# Patient Record
Sex: Female | Born: 1945
Health system: Southern US, Community
[De-identification: ages and names within clinical notes are randomized; demographics above are authoritative.]

## PROBLEM LIST (undated history)

## (undated) DIAGNOSIS — W19XXXA Unspecified fall, initial encounter: Secondary | ICD-10-CM

## (undated) DIAGNOSIS — T426X1A Poisoning by other antiepileptic and sedative-hypnotic drugs, accidental (unintentional), initial encounter: Secondary | ICD-10-CM

## (undated) DIAGNOSIS — G2581 Restless legs syndrome: Secondary | ICD-10-CM

## (undated) DIAGNOSIS — K0889 Other specified disorders of teeth and supporting structures: Secondary | ICD-10-CM

## (undated) DIAGNOSIS — E039 Hypothyroidism, unspecified: Secondary | ICD-10-CM

## (undated) DIAGNOSIS — F319 Bipolar disorder, unspecified: Secondary | ICD-10-CM

## (undated) DIAGNOSIS — M5430 Sciatica, unspecified side: Secondary | ICD-10-CM

## (undated) DIAGNOSIS — R519 Headache, unspecified: Secondary | ICD-10-CM

## (undated) DIAGNOSIS — F32A Depression, unspecified: Secondary | ICD-10-CM

## (undated) DIAGNOSIS — M549 Dorsalgia, unspecified: Secondary | ICD-10-CM

## (undated) DIAGNOSIS — E119 Type 2 diabetes mellitus without complications: Secondary | ICD-10-CM

## (undated) DIAGNOSIS — R32 Unspecified urinary incontinence: Secondary | ICD-10-CM

## (undated) DIAGNOSIS — M47812 Spondylosis without myelopathy or radiculopathy, cervical region: Secondary | ICD-10-CM

## (undated) DIAGNOSIS — F419 Anxiety disorder, unspecified: Secondary | ICD-10-CM

## (undated) DIAGNOSIS — G43909 Migraine, unspecified, not intractable, without status migrainosus: Secondary | ICD-10-CM

## (undated) DIAGNOSIS — G8929 Other chronic pain: Secondary | ICD-10-CM

## (undated) DIAGNOSIS — K3184 Gastroparesis: Secondary | ICD-10-CM

## (undated) DIAGNOSIS — K559 Vascular disorder of intestine, unspecified: Secondary | ICD-10-CM

## (undated) DIAGNOSIS — R600 Localized edema: Secondary | ICD-10-CM

## (undated) DIAGNOSIS — M1990 Unspecified osteoarthritis, unspecified site: Secondary | ICD-10-CM

## (undated) DIAGNOSIS — G4730 Sleep apnea, unspecified: Secondary | ICD-10-CM

## (undated) DIAGNOSIS — G9332 Myalgic encephalomyelitis/chronic fatigue syndrome: Secondary | ICD-10-CM

## (undated) DIAGNOSIS — R3915 Urgency of urination: Secondary | ICD-10-CM

## (undated) DIAGNOSIS — K760 Fatty (change of) liver, not elsewhere classified: Secondary | ICD-10-CM

## (undated) DIAGNOSIS — L930 Discoid lupus erythematosus: Secondary | ICD-10-CM

## (undated) DIAGNOSIS — F329 Major depressive disorder, single episode, unspecified: Secondary | ICD-10-CM

## (undated) DIAGNOSIS — H9193 Unspecified hearing loss, bilateral: Secondary | ICD-10-CM

## (undated) DIAGNOSIS — R410 Disorientation, unspecified: Secondary | ICD-10-CM

## (undated) DIAGNOSIS — J189 Pneumonia, unspecified organism: Secondary | ICD-10-CM

## (undated) DIAGNOSIS — K589 Irritable bowel syndrome without diarrhea: Secondary | ICD-10-CM

## (undated) DIAGNOSIS — M797 Fibromyalgia: Secondary | ICD-10-CM

## (undated) DIAGNOSIS — F1010 Alcohol abuse, uncomplicated: Secondary | ICD-10-CM

## (undated) DIAGNOSIS — Z87442 Personal history of urinary calculi: Secondary | ICD-10-CM

## (undated) DIAGNOSIS — M2540 Effusion, unspecified joint: Secondary | ICD-10-CM

## (undated) DIAGNOSIS — E785 Hyperlipidemia, unspecified: Secondary | ICD-10-CM

## (undated) DIAGNOSIS — M19049 Primary osteoarthritis, unspecified hand: Secondary | ICD-10-CM

## (undated) DIAGNOSIS — K5900 Constipation, unspecified: Secondary | ICD-10-CM

## (undated) DIAGNOSIS — M2550 Pain in unspecified joint: Secondary | ICD-10-CM

## (undated) DIAGNOSIS — I10 Essential (primary) hypertension: Secondary | ICD-10-CM

## (undated) DIAGNOSIS — R200 Anesthesia of skin: Secondary | ICD-10-CM

## (undated) DIAGNOSIS — R350 Frequency of micturition: Secondary | ICD-10-CM

## (undated) DIAGNOSIS — R1310 Dysphagia, unspecified: Secondary | ICD-10-CM

## (undated) DIAGNOSIS — K5790 Diverticulosis of intestine, part unspecified, without perforation or abscess without bleeding: Secondary | ICD-10-CM

## (undated) DIAGNOSIS — N393 Stress incontinence (female) (male): Secondary | ICD-10-CM

## (undated) DIAGNOSIS — R51 Headache: Secondary | ICD-10-CM

## (undated) HISTORY — DX: Hyperlipidemia, unspecified: E78.5

## (undated) HISTORY — DX: Localized edema: R60.0

## (undated) HISTORY — DX: Unspecified hearing loss, bilateral: H91.93

## (undated) HISTORY — PX: TUBAL LIGATION: SHX77

## (undated) HISTORY — DX: Vascular disorder of intestine, unspecified: K55.9

## (undated) HISTORY — DX: Myalgic encephalomyelitis/chronic fatigue syndrome: G93.32

## (undated) HISTORY — PX: COLONOSCOPY: SHX174

## (undated) HISTORY — DX: Bipolar disorder, unspecified: F31.9

## (undated) HISTORY — DX: Unspecified osteoarthritis, unspecified site: M19.90

## (undated) HISTORY — DX: Anxiety disorder, unspecified: F41.9

## (undated) HISTORY — DX: Spondylosis without myelopathy or radiculopathy, cervical region: M47.812

## (undated) HISTORY — DX: Other specified disorders of teeth and supporting structures: K08.89

## (undated) HISTORY — DX: Stress incontinence (female) (male): N39.3

## (undated) HISTORY — DX: Depression, unspecified: F32.A

## (undated) HISTORY — DX: Sciatica, unspecified side: M54.30

## (undated) HISTORY — PX: APPENDECTOMY: SHX54

## (undated) HISTORY — DX: Sleep apnea, unspecified: G47.30

## (undated) HISTORY — DX: Dysphagia, unspecified: R13.10

## (undated) HISTORY — DX: Type 2 diabetes mellitus without complications: E11.9

## (undated) HISTORY — DX: Alcohol abuse, uncomplicated: F10.10

## (undated) HISTORY — PX: SHOULDER ARTHROSCOPY: SHX128

## (undated) HISTORY — DX: Major depressive disorder, single episode, unspecified: F32.9

## (undated) HISTORY — DX: Fatty (change of) liver, not elsewhere classified: K76.0

## (undated) HISTORY — PX: ESOPHAGOGASTRODUODENOSCOPY: SHX1529

## (undated) HISTORY — DX: Primary osteoarthritis, unspecified hand: M19.049

## (undated) HISTORY — PX: TONSILLECTOMY: SUR1361

## (undated) HISTORY — PX: JOINT REPLACEMENT: SHX530

## (undated) HISTORY — PX: ABDOMINAL HYSTERECTOMY: SHX81

## (undated) HISTORY — PX: DILATION AND CURETTAGE OF UTERUS: SHX78

---
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
## 2005-08-23 ENCOUNTER — Ambulatory Visit: Payer: Self-pay | Admitting: *Deleted

## 2005-08-23 ENCOUNTER — Inpatient Hospital Stay (HOSPITAL_COMMUNITY): Admission: EM | Admit: 2005-08-23 | Discharge: 2005-08-26 | Payer: Self-pay | Admitting: Psychiatry

## 2005-08-23 ENCOUNTER — Emergency Department (HOSPITAL_COMMUNITY): Admission: EM | Admit: 2005-08-23 | Discharge: 2005-08-23 | Payer: Self-pay | Admitting: Emergency Medicine

## 2006-08-04 ENCOUNTER — Inpatient Hospital Stay (HOSPITAL_COMMUNITY): Admission: EM | Admit: 2006-08-04 | Discharge: 2006-08-10 | Payer: Self-pay | Admitting: Psychiatry

## 2006-08-04 ENCOUNTER — Emergency Department (HOSPITAL_COMMUNITY): Admission: EM | Admit: 2006-08-04 | Discharge: 2006-08-04 | Payer: Self-pay | Admitting: Emergency Medicine

## 2006-08-04 ENCOUNTER — Ambulatory Visit: Payer: Self-pay | Admitting: Psychiatry

## 2006-10-11 ENCOUNTER — Emergency Department (HOSPITAL_COMMUNITY): Admission: EM | Admit: 2006-10-11 | Discharge: 2006-10-11 | Payer: Self-pay | Admitting: Emergency Medicine

## 2006-10-13 ENCOUNTER — Other Ambulatory Visit (HOSPITAL_COMMUNITY): Admission: RE | Admit: 2006-10-13 | Discharge: 2007-01-11 | Payer: Self-pay | Admitting: Psychiatry

## 2006-10-14 ENCOUNTER — Ambulatory Visit: Payer: Self-pay | Admitting: Psychiatry

## 2006-12-15 ENCOUNTER — Other Ambulatory Visit (HOSPITAL_COMMUNITY): Admission: RE | Admit: 2006-12-15 | Discharge: 2006-12-20 | Payer: Self-pay | Admitting: Psychiatry

## 2007-01-15 ENCOUNTER — Ambulatory Visit: Payer: Self-pay | Admitting: *Deleted

## 2007-01-15 ENCOUNTER — Inpatient Hospital Stay (HOSPITAL_COMMUNITY): Admission: EM | Admit: 2007-01-15 | Discharge: 2007-01-20 | Payer: Self-pay | Admitting: *Deleted

## 2007-01-15 ENCOUNTER — Emergency Department (HOSPITAL_COMMUNITY): Admission: EM | Admit: 2007-01-15 | Discharge: 2007-01-15 | Payer: Self-pay | Admitting: Emergency Medicine

## 2007-03-04 ENCOUNTER — Emergency Department (HOSPITAL_COMMUNITY): Admission: EM | Admit: 2007-03-04 | Discharge: 2007-03-04 | Payer: Self-pay | Admitting: Emergency Medicine

## 2007-04-28 ENCOUNTER — Encounter: Admission: RE | Admit: 2007-04-28 | Discharge: 2007-04-28 | Payer: Self-pay | Admitting: General Practice

## 2010-03-09 DIAGNOSIS — K559 Vascular disorder of intestine, unspecified: Secondary | ICD-10-CM

## 2010-03-09 HISTORY — DX: Vascular disorder of intestine, unspecified: K55.9

## 2010-04-28 ENCOUNTER — Encounter: Payer: Self-pay | Admitting: Internal Medicine

## 2010-05-01 ENCOUNTER — Encounter: Payer: Self-pay | Admitting: Internal Medicine

## 2010-05-05 ENCOUNTER — Other Ambulatory Visit: Payer: Self-pay | Admitting: Internal Medicine

## 2010-05-05 ENCOUNTER — Other Ambulatory Visit: Payer: BC Managed Care – PPO

## 2010-05-05 ENCOUNTER — Ambulatory Visit (INDEPENDENT_AMBULATORY_CARE_PROVIDER_SITE_OTHER): Payer: BC Managed Care – PPO | Admitting: Internal Medicine

## 2010-05-05 ENCOUNTER — Encounter: Payer: Self-pay | Admitting: Internal Medicine

## 2010-05-05 DIAGNOSIS — J309 Allergic rhinitis, unspecified: Secondary | ICD-10-CM | POA: Insufficient documentation

## 2010-05-05 DIAGNOSIS — H6690 Otitis media, unspecified, unspecified ear: Secondary | ICD-10-CM | POA: Insufficient documentation

## 2010-05-05 DIAGNOSIS — F341 Dysthymic disorder: Secondary | ICD-10-CM

## 2010-05-05 DIAGNOSIS — M19049 Primary osteoarthritis, unspecified hand: Secondary | ICD-10-CM | POA: Insufficient documentation

## 2010-05-05 DIAGNOSIS — R930 Abnormal findings on diagnostic imaging of skull and head, not elsewhere classified: Secondary | ICD-10-CM

## 2010-05-05 DIAGNOSIS — H9190 Unspecified hearing loss, unspecified ear: Secondary | ICD-10-CM | POA: Insufficient documentation

## 2010-05-05 DIAGNOSIS — E1169 Type 2 diabetes mellitus with other specified complication: Secondary | ICD-10-CM | POA: Insufficient documentation

## 2010-05-05 DIAGNOSIS — E785 Hyperlipidemia, unspecified: Secondary | ICD-10-CM | POA: Insufficient documentation

## 2010-05-05 DIAGNOSIS — N393 Stress incontinence (female) (male): Secondary | ICD-10-CM | POA: Insufficient documentation

## 2010-05-05 DIAGNOSIS — J069 Acute upper respiratory infection, unspecified: Secondary | ICD-10-CM

## 2010-05-05 DIAGNOSIS — F1011 Alcohol abuse, in remission: Secondary | ICD-10-CM

## 2010-05-05 DIAGNOSIS — R9431 Abnormal electrocardiogram [ECG] [EKG]: Secondary | ICD-10-CM

## 2010-05-05 DIAGNOSIS — M479 Spondylosis, unspecified: Secondary | ICD-10-CM | POA: Insufficient documentation

## 2010-05-05 DIAGNOSIS — F32A Depression, unspecified: Secondary | ICD-10-CM | POA: Insufficient documentation

## 2010-05-05 LAB — CBC WITH DIFFERENTIAL/PLATELET
Basophils Absolute: 0 10*3/uL (ref 0.0–0.1)
Basophils Relative: 0.1 % (ref 0.0–3.0)
Eosinophils Absolute: 0.4 10*3/uL (ref 0.0–0.7)
Eosinophils Relative: 3.4 % (ref 0.0–5.0)
HCT: 40.5 % (ref 36.0–46.0)
Hemoglobin: 13.9 g/dL (ref 12.0–15.0)
Lymphocytes Relative: 20.8 % (ref 12.0–46.0)
Lymphs Abs: 2.5 10*3/uL (ref 0.7–4.0)
MCHC: 34.2 g/dL (ref 30.0–36.0)
MCV: 93.4 fl (ref 78.0–100.0)
Monocytes Absolute: 0.6 10*3/uL (ref 0.1–1.0)
Monocytes Relative: 4.5 % (ref 3.0–12.0)
Neutro Abs: 8.7 10*3/uL — ABNORMAL HIGH (ref 1.4–7.7)
Neutrophils Relative %: 71.2 % (ref 43.0–77.0)
Platelets: 263 10*3/uL (ref 150.0–400.0)
RBC: 4.34 Mil/uL (ref 3.87–5.11)
RDW: 12.7 % (ref 11.5–14.6)
WBC: 12.2 10*3/uL — ABNORMAL HIGH (ref 4.5–10.5)

## 2010-05-05 LAB — BASIC METABOLIC PANEL
BUN: 14 mg/dL (ref 6–23)
CO2: 29 mEq/L (ref 19–32)
Calcium: 10 mg/dL (ref 8.4–10.5)
Chloride: 105 mEq/L (ref 96–112)
Creatinine, Ser: 0.9 mg/dL (ref 0.4–1.2)
GFR: 64.07 mL/min (ref >60.00)
Glucose, Bld: 80 mg/dL (ref 70–99)
Potassium: 4.4 mEq/L (ref 3.5–5.1)
Sodium: 141 mEq/L (ref 135–145)

## 2010-05-05 LAB — LIPID PANEL
Cholesterol: 229 mg/dL — ABNORMAL HIGH (ref 0–200)
HDL: 78.3 mg/dL (ref >39.00)
Total CHOL/HDL Ratio: 3
Triglycerides: 254 mg/dL — ABNORMAL HIGH (ref 0.0–149.0)
VLDL: 50.8 mg/dL — ABNORMAL HIGH (ref 0.0–40.0)

## 2010-05-05 LAB — HEPATIC FUNCTION PANEL
ALT: 11 U/L (ref 0–35)
AST: 15 U/L (ref 0–37)
Albumin: 3.5 g/dL (ref 3.5–5.2)
Alkaline Phosphatase: 62 U/L (ref 39–117)
Bilirubin, Direct: 0.1 mg/dL (ref 0.0–0.3)
Total Bilirubin: 0.6 mg/dL (ref 0.3–1.2)
Total Protein: 6.4 g/dL (ref 6.0–8.3)

## 2010-05-05 LAB — LDL CHOLESTEROL, DIRECT: Direct LDL: 122.3 mg/dL

## 2010-05-05 LAB — TSH: TSH: 1 u[IU]/mL (ref 0.35–5.50)

## 2010-05-06 ENCOUNTER — Telehealth: Payer: Self-pay | Admitting: Internal Medicine

## 2010-05-12 ENCOUNTER — Ambulatory Visit: Payer: BC Managed Care – PPO | Admitting: Internal Medicine

## 2010-05-13 ENCOUNTER — Encounter: Payer: Self-pay | Admitting: Internal Medicine

## 2010-05-13 ENCOUNTER — Ambulatory Visit (INDEPENDENT_AMBULATORY_CARE_PROVIDER_SITE_OTHER): Payer: BC Managed Care – PPO | Admitting: Internal Medicine

## 2010-05-13 ENCOUNTER — Ambulatory Visit (HOSPITAL_COMMUNITY)
Admission: RE | Admit: 2010-05-13 | Discharge: 2010-05-13 | Disposition: A | Discharge status: Home / Self Care | Payer: BC Managed Care – PPO | Source: Ambulatory Visit | Attending: Internal Medicine | Admitting: Internal Medicine

## 2010-05-13 DIAGNOSIS — D352 Benign neoplasm of pituitary gland: Secondary | ICD-10-CM | POA: Insufficient documentation

## 2010-05-13 DIAGNOSIS — R9431 Abnormal electrocardiogram [ECG] [EKG]: Secondary | ICD-10-CM

## 2010-05-13 DIAGNOSIS — R930 Abnormal findings on diagnostic imaging of skull and head, not elsewhere classified: Secondary | ICD-10-CM

## 2010-05-13 MED ORDER — GADOBENATE DIMEGLUMINE 529 MG/ML IV SOLN
10.0000 mL | Freq: Once | INTRAVENOUS | Status: AC | PRN
  Administered 2010-05-13: 10 mL via INTRAVENOUS

## 2010-05-15 ENCOUNTER — Telehealth: Payer: Self-pay | Admitting: Internal Medicine

## 2010-05-15 NOTE — Letter (Signed)
Summary: Sports Medicine & [facility]  Sports Medicine & [facility]   Imported By: Sherian Rein 05/07/2010 10:10:36  _____________________________________________________________________  External Attachment:    Type:   Image     Comment:   External Document

## 2010-05-15 NOTE — Progress Notes (Signed)
Summary: ?  Phone Note Other Incoming   Caller: [phone] Summary of Call: Pt is wanting to know why she is sch for a stress test. Please Advise Initial call taken by: Ami Bullins CMA,  May 06, 2010 8:45 AM  Follow-up for Phone Call        abnormal EKG with suggestion of old injury/infarct. It is documented in my office note.  Follow-up by: Jacques Navy MD,  May 06, 2010 9:29 AM  Additional Follow-up for Phone Call Additional follow up Details #1::        left detailed vm on pt's cell  Additional Follow-up by: Lamar Sprinkles, CMA,  May 06, 2010 5:17 PM
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
## 2010-05-16 ENCOUNTER — Telehealth (INDEPENDENT_AMBULATORY_CARE_PROVIDER_SITE_OTHER): Payer: Self-pay | Admitting: *Deleted

## 2010-05-19 ENCOUNTER — Encounter: Payer: Self-pay | Admitting: Internal Medicine

## 2010-05-19 ENCOUNTER — Ambulatory Visit (HOSPITAL_COMMUNITY): Payer: BC Managed Care – PPO | Attending: Internal Medicine

## 2010-05-19 DIAGNOSIS — R0609 Other forms of dyspnea: Secondary | ICD-10-CM | POA: Insufficient documentation

## 2010-05-19 DIAGNOSIS — E785 Hyperlipidemia, unspecified: Secondary | ICD-10-CM | POA: Insufficient documentation

## 2010-05-19 DIAGNOSIS — R42 Dizziness and giddiness: Secondary | ICD-10-CM | POA: Insufficient documentation

## 2010-05-19 DIAGNOSIS — R0989 Other specified symptoms and signs involving the circulatory and respiratory systems: Secondary | ICD-10-CM | POA: Insufficient documentation

## 2010-05-19 DIAGNOSIS — Z8249 Family history of ischemic heart disease and other diseases of the circulatory system: Secondary | ICD-10-CM | POA: Insufficient documentation

## 2010-05-19 DIAGNOSIS — R5383 Other fatigue: Secondary | ICD-10-CM | POA: Insufficient documentation

## 2010-05-19 DIAGNOSIS — R5381 Other malaise: Secondary | ICD-10-CM | POA: Insufficient documentation

## 2010-05-19 DIAGNOSIS — R079 Chest pain, unspecified: Secondary | ICD-10-CM | POA: Insufficient documentation

## 2010-05-19 DIAGNOSIS — R072 Precordial pain: Secondary | ICD-10-CM

## 2010-05-20 NOTE — Assessment & Plan Note (Signed)
Summary: PER SARAH 1 WK 30 MIN FU--STC   Vital Signs:  Patient profile:   65 year old female Height:      62 inches Weight:      154 pounds BMI:     28.27 O2 Sat:      97 % on Room air Temp:     98.0 degrees F oral Pulse rate:   75 / minute BP sitting:   120 / 80  (left arm) Cuff size:   regular  Vitals Entered By: Bill Salinas CMA (May 13, 2010 1:03 PM)  O2 Flow:  Room air CC: ov to discuss lab work/ ab   Primary Care Provider:  Illene Regulus  CC:  ov to discuss lab work/ ab.  History of Present Illness: patient presents to discuss labs but she had already received a copy. She is for MRI today to evaluate question enlarged pituitary. She is schedule for stress echo on March 12 due to deep q wave V1 and V2 on EKG to r/o old infarct with any wall motion abnormality.  she reports a persistent cough but is not taking any anti-tussive agent.   Current Medications (verified): 1)  Lithium Carbonate 300 Mg Caps (Lithium Carbonate) .Marland Kitchen.. 1 Capsule Two Times A Day 2)  Cenestin 0.625 Mg Tabs (Estrogens Conj Synthetic A) .... 2 Tablets Once Daily 3)  Lexapro 10 Mg Tabs (Escitalopram Oxalate) .Marland Kitchen.. 1 Tablet At Night 4)  Levothroid 75 Mcg Tabs (Levothyroxine Sodium) .Marland Kitchen.. 1 Tablet in The Morning 5)  Abilify 2 Mg Tabs (Aripiprazole) .Marland Kitchen.. 1 Qam 6)  Seroquel 100 Mg Tabs (Quetiapine Fumarate) .... 3 Tablets At Bedtime 7)  Valtrex 500 Mg Tabs (Valacyclovir Hcl) .Marland Kitchen.. 1 Tablet Two Times A Day 8)  Valium 5 Mg Tabs (Diazepam) .... 3-4 Tablets As Needed Daily 9)  Fluticasone Propionate 50 Mcg/act Susp (Fluticasone Propionate) .Marland Kitchen.. 1 Spray in Eac Nostril Two Times A Day For Short Term Management of Flare of Congestion  Allergies (verified): 1)  ! Codeine 2)  ! * Decongestants 3)  ! Novocain 4)  ! * Mycins   Complete Medication List: 1)  Lithium Carbonate 300 Mg Caps (Lithium carbonate) .Marland Kitchen.. 1 capsule two times a day 2)  Cenestin 0.625 Mg Tabs (Estrogens conj synthetic a) .... 2 tablets once  daily 3)  Lexapro 10 Mg Tabs (Escitalopram oxalate) .Marland Kitchen.. 1 tablet at night 4)  Levothroid 75 Mcg Tabs (Levothyroxine sodium) .Marland Kitchen.. 1 tablet in the morning 5)  Abilify 2 Mg Tabs (Aripiprazole) .Marland Kitchen.. 1 qam 6)  Seroquel 100 Mg Tabs (Quetiapine fumarate) .... 3 tablets at bedtime 7)  Valtrex 500 Mg Tabs (Valacyclovir hcl) .Marland Kitchen.. 1 tablet two times a day 8)  Valium 5 Mg Tabs (Diazepam) .... 3-4 tablets as needed daily 9)  Fluticasone Propionate 50 Mcg/act Susp (Fluticasone propionate) .Marland Kitchen.. 1 spray in eac nostril two times a day for short term management of flare of congestion  Other Orders: No Charge Patient Arrived (NCPA0) (NCPA0)   Orders Added: 1)  No Charge Patient Arrived (NCPA0) [NCPA0]

## 2010-05-20 NOTE — Progress Notes (Signed)
Summary: CALL   Phone Note Call from Patient Call back at Home Phone [phone]   Summary of Call: Patient is requesting a call back from MD regarding results.  Initial call taken by: Lamar Sprinkles, CMA,  May 15, 2010 9:09 AM  Follow-up for Phone Call        called patient: 1. small microadenoma of the pituitary that is asymptomatic. Plan is follow up study in one year 2. MRI reveals sinusitis with air fluid levels.She has sinus congestion and sweats but no fever. Plan - augmentin 875 two times a day x 14. If not resolved - ENT referral.  Follow-up by: Jacques Navy MD,  May 15, 2010 6:34 PM    New/Updated Medications: AMOXICILLIN-POT CLAVULANATE 875-125 MG TABS (AMOXICILLIN-POT CLAVULANATE) 1 by mouth two times a day x 14 for sinusitis Prescriptions: AMOXICILLIN-POT CLAVULANATE 875-125 MG TABS (AMOXICILLIN-POT CLAVULANATE) 1 by mouth two times a day x 14 for sinusitis  #28 x 0   Entered and Authorized by:   Jacques Navy MD   Signed by:   Jacques Navy MD on 05/15/2010   Method used:   Electronically to        [facility]. [phone]* (retail)       [address]       [place], [place]  [address]       Ph: [phone]       Fax: [phone]   RxID:   [id number]

## 2010-05-20 NOTE — Progress Notes (Signed)
Summary: stess echo appt  Phone Note Outgoing Call Call back at [facility] Phone [phone]   Call placed by: Stanton Kidney, EMT-P,  May 16, 2010 9:29 AM Call placed to: Patient Action Taken: Phone Call Completed Summary of Call: S/W patient ref: stress echo appt. Stanton Kidney, EMT-P  May 16, 2010 9:29 AM

## 2010-05-22 ENCOUNTER — Telehealth: Payer: Self-pay | Admitting: Internal Medicine

## 2010-05-23 ENCOUNTER — Telehealth: Payer: Self-pay | Admitting: Internal Medicine

## 2010-05-27 NOTE — Progress Notes (Signed)
Summary: Rx request  Phone Note Call from Patient Call back at Home Phone [phone]   Caller: Patient Summary of Call: Pt c/o symptoms of oral thrush (pain, red lesions, 'scratchy' feeling especially on tongue and cheeks) from ABI given. Could you please authorize Rx for Magic Mouthwash.? Correct pharmacy is [facility] [phone] Initial call taken by: Burnard Leigh [facility]),  May 23, 2010 9:45 AM  Follow-up for Phone Call        ok x 1 Follow-up by: Tresa Garter MD,  May 23, 2010 5:58 PM  Additional Follow-up for Phone Call Additional follow up Details #1::        Pt has never had this med, please give directions and qty, [facility] Additional Follow-up by: Lamar Sprinkles, CMA,  May 23, 2010 6:06 PM    Additional Follow-up for Phone Call Additional follow up Details #2::    Verbal info given from MD, Pt informed, She has had in past w/no probs despite novocaine allergy.  Follow-up by: Lamar Sprinkles, CMA,  May 23, 2010 6:18 PM  New/Updated Medications: * MAJIC MOUTHWASH 5 cc swish hold swallow qid x 10 days Prescriptions: MAJIC MOUTHWASH 5 cc swish hold swallow qid x 10 days  #240 x 0   Entered by:   Lamar Sprinkles, CMA   Authorized by:   Tresa Garter MD   Signed by:   Lamar Sprinkles, CMA on 05/23/2010   Method used:   Telephoned to ...         RxID:   [id number]

## 2010-05-27 NOTE — Progress Notes (Signed)
Summary: results request  Phone Note Call from Patient Call back at Home Phone [phone]   Caller: Patient Summary of Call: Pt requesting results of Echo done on 05/19/10. Initial call taken by: Burnard Leigh [facility]),  May 22, 2010 10:54 AM  Follow-up for Phone Call        Negfative study: no sign of valvular disease, no evidence of change in heart function and no evidence of blocked arteries Follow-up by: Jacques Navy MD,  May 22, 2010 1:22 PM  Additional Follow-up for Phone Call Additional follow up Details #1::        Pt informed. Pt c/o symptoms of thrush in mouth from ABI. Additional Follow-up by: Burnard Leigh [facility]),  May 23, 2010 9:37 AM

## 2010-07-22 NOTE — H&P (Signed)
NAME:  [name] [name], [name] [name] NO.:  [id number]   MEDICAL RECORD NO.:  [id number]          PATIENT TYPE:  IPS   LOCATION:  0601                          FACILITY:  [facility]   PHYSICIAN:  Jasmine Pang, M.D. DATE OF BIRTH:  [birth year]   DATE OF ADMISSION:  01/15/2007  DATE OF DISCHARGE:                       PSYCHIATRIC ADMISSION ASSESSMENT   IDENTIFYING INFORMATION:  This is a 65 year old divorced white female.  She has been married 5 times.  Her UDS was positive for benzos and  amphetamines.  She had no alcohol on board.  She presented to the [facility] ED requesting detox from Klonopin and alcohol.  She binges every 2  to 3 weeks for 2 to 5 days on wine and vodka.  She has been more  depressed lately.  She is stressed about finances and her current  finance's family coming in for Christmas.  She is also unemployed.  She  feels guilty as he is paying all the bills.  She feels very depressed.  She states that she does go to AA and has a sponsor.  She has also had  poor sleep lately.   PAST PSYCHIATRIC HISTORY:  In 2002 she underwent detox at [facility].  She was with [place] on May 28 to August 09, 2006 and her primary reason  was to have an alcohol detox.  She was noted to have bipolar type 2,  most recently depressed, and alcohol detox.   SOCIAL HISTORY:  She has had 1 year of college.  She has been married  and divorced x5.  She has 2 daughters ages 57 and 38.  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   FAMILY HISTORY:  She states her mother died of an overdose of alcohol  and drugs.  Her mother, father and 2 brothers all have alcohol issues.   ALCOHOL AND DRUG HISTORY:  She has been using alcohol since age 59, 2  bottles of wine and 2 vodka daily.   PRIMARY CARE Jibran Crookshanks:  Dr. Jacolyn Reedy.   PSYCHIATRIST:  Dr. Pershing Proud over in [place].   MEDICAL PROBLEMS:  Restless legs syndrome.   MEDICATIONS:  She is currently prescribed:  1. Trazodone 100 mg at h.s.  2. Mirapex 0.25 mg p.o. a.m. and 2 at h.s.  3. Fluoxetine 20 mg p.o. daily.  4. Estrogen 0.625 mg p.o. daily.  5. Trileptal 300 mg p.o. b.i.d.  6. Klonopin 0.5 mg p.o. b.i.d.   DRUG ALLERGIES:  CODEINE AND MYCINS.   POSITIVE PHYSICAL FINDINGS:  She was medically cleared at the ED at  [facility].  Her urine was positive for benzos and amphetamines,  although she is not sure how she had amphetamines.  Her alcohol level  was less than 5.  She had a slightly elevated glucose at 112, otherwise  she had no other abnormal findings on her labs.  Vital signs on admission to our unit showed that she is 62.5 inches  tall, she weighs 141, temperature is 97.6, blood pressure is 154/91,  pulse was  74, respirations are 16.  She has a slight hand tremor and has  no other positive physical findings at the moment.   MENTAL STATUS EXAM:  She is alert and oriented.  She is appropriately  groomed, dressed and nourished.  Her speech has a normal rate, rhythm  and tone.  Her mood she reports being depressed.  Her affect has a  normal range.  Thought processes are clear, rational and goal-oriented.  She wants to get in with a female psychiatrist and a therapist.  Judgment and insight are fair.  Concentration and memory are intact.  She denies being suicidal or homicidal.  She denies auditory or visual  hallucinations.   Axis I:  Bipolar 2, depressed; alcohol and benzodiazepine  abuse/dependence.  Axis II:  Personality disorder, not otherwise specified; married and  divorced x5.  Axis III:  Restless legs syndrome.  Axis IV:  Problems with primary support group; occupational problem;  economic problem; housing problem.  Axis V:  35.   PLAN:  To admit for safety and stabilization.  Toward that end, she was  started on the low dose Librium protocol.  Will adjust her meds, adding  Lamictal.  We can increase the trazodone to 100 mg  at h.s. with 1  repeat, and will identify a female psychiatrist and counselor for her to  start treatment with, as she does not want to go to [place] anymore.  Estimated length of stay is 3 to 4 days upon successful completion of  the low dose Librium protocol.      Mickie Leonarda Salon, P.A.-C.      Jasmine Pang, M.D.  Electronically Signed    MD/MEDQ  D:  01/16/2007  T:  01/17/2007  Job:  [id number]

## 2010-07-22 NOTE — Discharge Summary (Signed)
NAME:  [name] [name], [name] [name] NO.:  [id number]   MEDICAL RECORD NO.:  [id number]          PATIENT TYPE:  IPS   LOCATION:  0601                          FACILITY:  [facility]   PHYSICIAN:  Anselm Jungling, MD  DATE OF BIRTH:  May 02, 1945   DATE OF ADMISSION:  08/04/2006  DATE OF DISCHARGE:  08/09/2006                               DISCHARGE SUMMARY   IDENTIFYING DATA/REASON FOR ADMISSION:  This was the second [facility]  admission for this 65 year old single female, who was last admitted here  in June of 2006.  She presented with alcohol dependence.  Her last drink  had been the day prior to admission, and she admitted to drinking two  bottles of wine daily.  She had been living with her boyfriend who she  also described as alcoholic.  She had been seeing a psychiatrist named  Dr. Lady Saucier in [place].  Please refer to the admission note for  further details pertaining to the symptoms, circumstances and history  that led to her hospitalization.   INITIAL DIAGNOSTIC IMPRESSION:  She was given initial AXIS I diagnosis  of alcohol dependence.  She had also been using benzodiazepines and met  diagnostic criteria for benzodiazepine dependence.   MEDICAL/LABORATORY:  The patient was in fairly good health without any  active or chronic medical problems.  She was medically and physically  assessed by the psychiatric nurse practitioner.  She was continued on  Premarin 0.625 mg daily and Mirapex 0.5 mg daily.   HOSPITAL COURSE:  The patient was admitted to the adult inpatient  psychiatric service.  She presented as a well-nourished, well-developed  woman who was alert, fully oriented, and nontremulous, having been given  initial doses of Librium by the time of the initial interview.  She was  pleasant but very sad, and denied suicidal ideation.  She denied any  signs or symptoms of psychosis or thought disorder and verbalized a  strong desire for help.   She was placed on an alcohol  withdrawal protocol based upon Librium in  tapering doses.  She participated in various therapeutic groups and  activities, including those geared towards 12-step recovery.  She was  continued on previous psychotropics that had included Prozac, trazodone,  and Trileptal.  She was a good participant in the treatment program.  She worked closely with the casemanager regarding aftercare and the  possibility of going to an [facility] following her discharge.   Her detoxification proceeded uneventfully, and her mood brightened  considerably over the course of her stay.   By the sixth hospital day, the patient appeared to have completed the  detoxification process and was agreeable to following up with aftercare  arrangements as follows.   AFTERCARE:  The patient was to return to Dr. Lady Saucier with an appointment  on October 11, 2006, but the patient was placed on a late cancellation  list.  Dr. Lady Saucier practices with the [facility].   DISCHARGE MEDICATIONS:  1. Prozac 30 mg daily.  2. Neurontin 300 mg q.i.d.  3. Trileptal 300 mg daily.  4. Premarin 0.625 mg daily.  5. Mirapex 0.5 mg  daily.   DISCHARGE DIAGNOSES:  AXIS I:  Bipolar disorder, type 2, most recently  depressed.  Alcohol dependence, early remission.  AXIS II:  Deferred.  AXIS III:  No acute or chronic illnesses.  AXIS IV:  Stressors:  Severe.  AXIS V:  GAF on discharge 65.      Anselm Jungling, MD  Electronically Signed     SPB/MEDQ  D:  08/09/2006  T:  08/10/2006  Job:  [id number]

## 2010-07-22 NOTE — Discharge Summary (Signed)
NAME:  FRADY, TADDEO NO.:  [id number]   MEDICAL RECORD NO.:  [id number]          PATIENT TYPE:  IPS   LOCATION:  0601                          FACILITY:  [facility]   PHYSICIAN:  Jasmine Pang, M.D. DATE OF BIRTH:  02-14-1946   DATE OF ADMISSION:  01/15/2007  DATE OF DISCHARGE:  01/20/2007                               DISCHARGE SUMMARY   IDENTIFYING INFORMATION:  This is a 65 year old divorced white female  who was admitted on a voluntary basis on January 15, 2007.   HISTORY OF PRESENT ILLNESS:  The patient states she presented to the  [facility] ED requesting detox from Klonopin and alcohol.  She binges  every 2-3 weeks for 2-5 days on wine and vodka.  She has been more  depressed lately.  She stressed about finances and her current fiance's  family come in for Christmas.  She is also unemployed.  She feels guilty  that her fiance is having to pay all the bills.  She feels depressed.  She states that she goes to AA and has a sponsor.  She also has had poor  sleep lately.  In 2002, she underwent detox at [facility].  She was  with [place] on Aug 04, 2006 through August 09, 2006.  Her primary reason was to  have alcohol detox.  She was noted to have bipolar type 2, most recently  depressed and along with her alcohol dependence.  She states her mother  died of an overdose of alcohol and drugs.  Her mother, father, and two  brothers all have alcohol issues.  She has been using alcohol since the  age of 60, two bottles of wine and two vodka drinks daily.  She has  restless leg syndrome.  She is currently on trazodone, Mirapex,  fluoxetine, estrogen, Trileptal, and Klonopin.  She is allergic to  codeine and to mycins.   PHYSICAL FINDINGS:  The patient was medically cleared in the ED at  [facility].  Her urine was positive for benzos and amphetamines,  although, she was not sure how she had amphetamines because she is not  taking any.  Repeat UDS revealed no  amphetamines.  Her alcohol level was  less than 5.  She had a slightly elevated glucose at 112.  Otherwise  there were no abnormal findings on her labs.  After output the patient  was medically cleared at the ED at [facility].  There were no acute  physical or medical problems.   HOSPITAL COURSE:  Upon admission, the patient was continued on trazodone  100 mg p.o. q.h.s., Mirapex 0.25 mg two at bedtime, fluoxetine 20 mg  p.o. daily, Estrogen, Cenestin 0.625 mg p.o. daily, and Trileptal 300 mg  p.o. b.i.d.  She was also placed on a Librium detox protocol.  On  January 16, 2007, she was started on Lamictal 25 mg at bedtime.  On  January 17, 2007, trazodone was discontinued.  She was started on  Ambien 10 mg p.o. q.h.s. p.r.n. may repeat x1 if needed.  On January 19, 2007, she was started on Klonopin 0.5  mg p.o. b.i.d.  Due to her  severe anxiety on that day, she was given a one-time dose of Klonopin 1  mg now.  The patient tolerated her medications well.  She had some dry  mouth, but otherwise had no side effects to her medications.  In  sessions with me, the patient was friendly and cooperative with good eye  contact.  She also was able to participate appropriately in unit  therapeutic groups and activities.  She states she just lost her job 2  weeks ago.  She sees Dr. Pilar Jarvis, psychiatrist at [facility].  She has been having severe problems sleeping.  She states Dr. Lady Saucier  wanted to start her on Lamictal, but has not done this yet.  She  continued at first to be depressed and anxious.  She was feeling  frustrated with some of the younger peers in the unit who are  hyperactive.  She discussed her fiance's family coming for Christmas.  This is very stressful for her.  She also discussed the fiance's use of  alcohol, which makes things harder for her in terms of her recovery.  On  January 18, 2007, she was having less depression and anxiety.  She was  grateful for the support  of her fiance.  He has stated he will stop  drinking, but she is worried about whether he really will.  She began to  be anxious on January 19, 2007, and her Klonopin was restarted.  This  helped her anxiety considerably.  She had a family session with her  fiance.  He was supportive.  She stated she discussed her feelings of  guilt about being financially dependent on him.  On January 20, 2007,  mental status had improved from admission status.  The patient was  friendly and cooperative with good eye contact.  Speech was normal rate  and flow.  Psychomotor activity was within normal limits.  Her mood was  euthymic.  Affect wide range.  There was no suicidal or homicidal  ideation.  No thoughts of self-injurious behavior.  No auditory or  visual hallucinations.  No paranoia or delusions.  Thoughts were logical  and goal-directed.  Thought content, no predominant theme.  Cognitive  was grossly back to baseline.  The patient is planning to return home  and to live with her fiance and he is planning to pick her up today.  Follow up was scheduled at the [facility] with Dr. Jules Schick for medications and Milinda Cave for counseling.   DISCHARGE DIAGNOSES:  AXIS I:  Bipolar disorder, not otherwise  specified, alcohol dependence.  AXIS II:  None.  AXIS III:  Restless legs syndrome.  AXIS IV:  Moderate (problems with primary support group, occupational  problem, economic problem, housing problem, burden of psychiatric  illness).  AXIS V:  Global assessment of functioning was 50 upon discharge, global  assessment of functioning was 35 upon admission, and global assessment  of functioning highest past year was 65-70.   DISCHARGE PLAN:  There were no specific activity level or dietary  restrictions.   POSTHOSPITAL CARE PLAN:  The patient will see Dr. Jules Schick at the  [facility] on February 02, 2007 at 11 o'clock a.m.,  and Milinda Cave, therapist on February 22, 2007 at 9:30 a.m.   DISCHARGE MEDICATIONS:  Mirapex 0.5 mg p.o. q.h.s., Lamictal 25 mg at  bedtime, Premarin 0.625 mg at bedtime, fluoxetine 20 mg at bedtime,  Trileptal 300  mg at bedtime, and Ambien 10 mg p.o. q.h.s. one to two  pills p.o. q.h.s., and Klonopin 0.5 mg p.o. b.i.d.      Jasmine Pang, M.D.  Electronically Signed     BHS/MEDQ  D:  01/20/2007  T:  01/21/2007  Job:  [id number]

## 2010-07-22 NOTE — H&P (Signed)
NAME:  [name] [name], BEHAN NO.:  [id number]   MEDICAL RECORD NO.:  [id number]          PATIENT TYPE:  IPS   LOCATION:  0601                          FACILITY:  [facility]   PHYSICIAN:  Anselm Jungling, MD  DATE OF BIRTH:  1945/09/06   DATE OF ADMISSION:  08/04/2006  DATE OF DISCHARGE:                       PSYCHIATRIC ADMISSION ASSESSMENT   DATE OF ASSESSMENT:  Aug 04, 2006 at 9:20 a.m.   IDENTIFYING INFORMATION:  This is a 65 year old white female who is  divorced.  This is a voluntary admission.   HISTORY OF THE PRESENT ILLNESS:  This patient presents requesting detox  from alcohol.  She reports that after her last discharge in June 2007  she remained abstinent from alcohol for about 3-4 months and then  gradually relapsed on alcohol.  Now, her drinking has escalated to  drinking two  bottles of wine daily along with some additional mixed  drinks.  She has been taking Valium for several years and is currently  taking it by prescription from her psychiatrist who she reports would  like her to stop taking it but when she stops it, feels she has  difficulty, getting anxious and agitated without it.  She has endorsed  yesterday some suicidal thoughts with a plan to overdose on her  medications and today denies suicidal thought.  Denies that she ever had  any real intent for suicide.  She does have access to means and has no  prior suicide attempts.  She cites aggravating factors for her relapse  being chronic unemployment with financial dependence on her fiance with  whom she lives and who is actively using alcohol daily.  She denies any  hallucinations.  Denies formication.  Denies a history of liver disease  or seizures.  Denies any history of delirium tremens.  She endorses  problems with awakening in the night with some sweats, feeling anxious,  groggy in the morning.  Appetite is off.  She notes that her nutrition  has been poor and she was recently diagnosed with a  vitamin D deficiency  by her primary care physician.   PAST PSYCHIATRIC HISTORY:  This is the second admission to [facility] with the prior one being June 17-20, 2007 also  for alcohol detox.  She is currently followed by Dr. Lady Saucier at [facility] in [place].  She  has seen him for many years.  She plans to follow up with him.  She has  previously taken  Campral but is not currently on any medications for  relapse prevention.  She has never taken disulfiram.  No history of  mania.  No history of prior suicide attempts.  She does endorse a  history of blackout and some memory lapses during periods of alcohol  use.   SOCIAL HISTORY:  A divorced white female currently living with her  fiance who is actively using alcohol daily.  She has two daughters that  are supportive who are encouraging her to move out of her current living  situation to maintain sobriety.  She is quite conflicted about what  is  going to be her best plan for relapse prevention.  She cites chronic  unemployment and  financial issues as one of her aggravating factors.   FAMILY HISTORY:  Remarkable for mother with a history of overdose on  alcohol and medications.   ALCOHOL AND DRUG HISTORY:  The patient denies substance abuse other than  the alcohol.   MEDICAL HISTORY:  1. The patient's only medical problem is restless legs syndrome      according to history.  2. Past medical history also is remarkable for a history of lupus NOS      and some GERD.   CURRENT MEDICATIONS:  1. Valtrex 500 mg p.o. daily p.r.n. for herpes outbreak.  2. Zantac 75 mg p.r.n. for GERD.  3. Trileptal 300 mg daily.  4. Valium 5 mg daily.  5. Prozac 20 mg daily.  6. Cenestin 0.625 estrogen tab one daily.  7. Vitamin D 50,000 units one time weekly.   DRUG ALLERGIES:  ERYTHROMYCIN ETHYLSUCCINATE, NOVOCAIN, AND CODEINE.   POSITIVE PHYSICAL FINDINGS:  The patient's  full physical exam was done  in the emergency room.  It is noted in the record her alcohol level was  205.  CBC:  WBC 7.4, hemoglobin 14.3, hematocrit 41.9 and platelets  266,000.  Chemistries:  Sodium 139, potassium 3.6, chloride 107, carbon  dioxide 24, BUN 9, creatinine 0.74, and a random glucose of 141, calcium  normal at 8.7.  Urine drug screen was positive for benzodiazepines.  On  admission to the unit, she was a well-nourished, well-developed female  who is in no acute distress with CIWA score of approximately 10,  temperature 97.4, respirations 20, blood pressure 179/88, and a pulse of  84.   MENTAL STATUS EXAM:  A fully alert female, somewhat disheveled, anxious  appearing, but pleasant, cooperative, and appropriate.  Speech is normal  in pace, tone, amount, fluent and articulate.  She does minimize the  role of the Valium in her relapse prevention plan and we have done some  education about this today and have talked about discontinuing it and  appropriate alternatives.  Her mood is euthymic.  She has a lot of  concerns about returning to her present living situation and recognizes  that it is a risk to her abstinence but sees no way out of her current  situation.  Thought process is logical and coherent.  No suicidal  thoughts today.  No evidence of homicidal thought.  No flight of ideas,  paranoia, or signs of psychosis.  Cognition is well-preserved.  Short  and long-term memory intact.  Concentration is adequate.  Good judgment,  and impulse control within normal limits.  She is a reliable historian.   AXIS I:  Depressive disorder, not otherwise specified.  Ethyl alcohol  abuse and dependence.  AXIS II:  Deferred.  AXIS III:  Restless legs syndrome by history.  AXIS IV:  Severe stress with financial and domestic issues, problems  with primary support group, occupational problems, economic problems.  AXIS V:  Current 28, past year 75.  PLAN:  To voluntarily admit the  patient with q. 15-minute checks in  place.  We started her on a Librium protocol with a 50 mg loading dose  and she will receive 25 mg q.i.d. today along with 100 mg of thiamine IM  and we will taper that over the next 4 days.  Additional medications are  available to handle additional symptoms.  We are going  to discontinue  her Valium and I have discussed this with her.  We will  do a hepatic function panel and we will consider starting her back on  Campral.  I have also discussed the relative merits of Campral for  relapse prevention versus other alternatives and she is considering  those.  Estimated length of stay is 5 days.      Savon A. Lorin Picket, N.P.      Anselm Jungling, MD  Electronically Signed    MAS/MEDQ  D:  08/04/2006  T:  08/04/2006  Job:  [id number]

## 2010-07-25 ENCOUNTER — Telehealth: Payer: Self-pay | Admitting: *Deleted

## 2010-07-25 NOTE — Discharge Summary (Signed)
NAME:  [name] [name], [name] [name] NO.:  [id number]   MEDICAL RECORD NO.:  [id number]          PATIENT TYPE:  IPS   LOCATION:  0507                          FACILITY:  [facility]   PHYSICIAN:  Anselm Jungling, MD  DATE OF BIRTH:  1945/11/21   DATE OF ADMISSION:  08/23/2005  DATE OF DISCHARGE:  08/26/2005                                 DISCHARGE SUMMARY   IDENTIFYING DATA/REASON FOR ADMISSION:  The patient is a 65 year old,  divorced white female admitted for alcohol detoxification and treatment of  depression.  She is a patient of her psychiatrist, Dr. Lady Saucier, who practices  in [place].  She has seen him over many years.  The patient indicated  that she had been drinking heavily for many years, but the quantity of her  alcohol consumption had accelerated in recent months.  The patient felt that  she reached a point where she needed to stop drinking completely.  The  patient came to [place] on a regimen of Prozac and Valium prescribed by Dr.  Lady Saucier.  She indicated that she had never abused and of her prescription  medications.  Please refer to the admission note for further details  pertaining to the symptoms, circumstances and history that led to her  hospitalization.   INITIAL DIAGNOSTIC IMPRESSION:  She was given an initial AXIS I diagnosis of  alcohol dependence, and depressive disorder not otherwise specified.   MEDICAL/LABORATORY:  The patient was medically and physically assessed by  the psychiatric nurse practitioner.  There were no acute medical issues  during her brief inpatient stay.  She was continued on Mirapex 0.25 mg at  bed, and Cenestin 0.9 mg daily.   HOSPITAL COURSE:  The patient was admitted to the adult inpatient  psychiatric service.  She presented as a well-nourished, normally developed,  pleasant and appropriate woman who was fully oriented, well-organized, and  nontremulous.  Her thoughts and speech were normally organized.  She  appeared quite  sincere and determined in her desire to abstain from drinking  permanently.  Although her mood was somewhat depressed, she had no suicidal  ideation.   The patient participated in various therapeutic groups and activities  designed to help her acquire better coping skills, a better understanding of  her underlying disorders and dynamics, and the development of a safety plan.  She was placed on an alcohol detoxification protocol based upon Librium.  Hepatic function panel was within normal limits, and TSH was within normal  limits as well, as were electrolytes.   She was a good participant in the treatment program, and her detoxification  proceeded rather uneventfully.   She agreed to a family meeting with her fiance.  This appeared to be  important because she indicated that her fiance was also a heavy drinker,  who had no intention of stopping drinking any time in the near future.  The  meeting was successful, according to the patient, in that the fiancee  indicated that, although he did not intend to stop drinking himself, he  would support the patient's abstinence from alcohol.   The following day, the  patient felt ready for discharge.  She appeared to be  beyond the point of further alcohol detoxification symptoms.   AFTERCARE:  The patient was to follow up with Dr. Lady Saucier, whose next  available appointment was not until October 13, 2005, but the office indicated  that they would call the patient if there was a prior cancellation.  Also,  she was referred to [facility] for individual counseling, with an  appointment on August 28, 2005.   DISCHARGE MEDICATIONS:  1.  Prozac 40 mg daily.  2.  Mirapex 0.25 mg q.h.s.  3.  Trileptal 300 mg daily, which the patient had previously been taking.  4.  Cenestin 0.9 mg daily.   DISCHARGE DIAGNOSES:  AXIS I:  Alcohol dependence, early remission.  Depressive disorder not otherwise specified.  AXIS II:  Deferred.  AXIS III:  Healthy.   AXIS IV:  Stressors:  Severe.  AXIS V:  GAF on discharge 70.           ______________________________  Anselm Jungling, MD  Electronically Signed     SPB/MEDQ  D:  08/27/2005  T:  08/28/2005  Job:  [id number]

## 2010-07-25 NOTE — Telephone Encounter (Signed)
Pt is currently in [place]  Pt c/o swelling in her feet, ankles, legs and slight amount her in hands. She says she does not have SOB but feels "tight" under her breasts on both sides. Swelling started last week but has gotten worse this week. She is scheduled to fly back to [place] tomorrow evening.   I advised pt to go to local ER if she has SOB, chest pain/heavyness, left arm pain etc... She also c/o weight gain of 20 lbs over the last 2 mths but has also had increase in appetite. She would like to know what Dr Debby Bud suggests.

## 2010-07-25 NOTE — Telephone Encounter (Signed)
Patient informed - Scheduled for Monday pm.

## 2010-07-25 NOTE — Telephone Encounter (Signed)
Agree with ED for SOB or chest pain. Will need an appointment next week for eval

## 2010-07-28 ENCOUNTER — Ambulatory Visit (INDEPENDENT_AMBULATORY_CARE_PROVIDER_SITE_OTHER): Payer: BC Managed Care – PPO | Admitting: Internal Medicine

## 2010-07-28 DIAGNOSIS — B369 Superficial mycosis, unspecified: Secondary | ICD-10-CM

## 2010-07-28 DIAGNOSIS — I872 Venous insufficiency (chronic) (peripheral): Secondary | ICD-10-CM

## 2010-07-28 DIAGNOSIS — R1013 Epigastric pain: Secondary | ICD-10-CM

## 2010-07-28 DIAGNOSIS — R635 Abnormal weight gain: Secondary | ICD-10-CM

## 2010-07-28 DIAGNOSIS — K3189 Other diseases of stomach and duodenum: Secondary | ICD-10-CM

## 2010-07-28 MED ORDER — FLUCONAZOLE 100 MG PO TABS: 100.0000 mg | ORAL_TABLET | Freq: Every day | ORAL | Status: AC
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
## 2010-08-11 ENCOUNTER — Telehealth: Payer: Self-pay | Admitting: *Deleted

## 2010-08-11 NOTE — Telephone Encounter (Signed)
Nystatin powder 100,000units per gram - apply powder to intertriginous regions that are  Broken out bid xx 7 days. 30g 1 refill

## 2010-08-11 NOTE — Telephone Encounter (Signed)
Tx for yeast infection has not helped. She says MD advised her he would call in rx for powder.

## 2010-08-12 MED ORDER — NYSTATIN 100000 UNIT/GM EX POWD: CUTANEOUS | Status: DC

## 2010-08-21 ENCOUNTER — Telehealth: Payer: Self-pay | Admitting: *Deleted

## 2010-08-21 DIAGNOSIS — R21 Rash and other nonspecific skin eruption: Secondary | ICD-10-CM

## 2010-08-21 NOTE — Telephone Encounter (Signed)
Pt needs referral to derm as she does not have one already.

## 2010-08-21 NOTE — Telephone Encounter (Signed)
Needs to see a dermatologist. Does she have one?

## 2010-08-21 NOTE — Telephone Encounter (Signed)
Pt continues to c/o yeast "all over" - she completed diflucan x 14 days and using powder. Please advise, pt is scheduled to go out of town tomorrow.

## 2010-08-21 NOTE — Telephone Encounter (Signed)
[facility] notified.  

## 2010-09-12 ENCOUNTER — Ambulatory Visit (INDEPENDENT_AMBULATORY_CARE_PROVIDER_SITE_OTHER): Payer: BC Managed Care – PPO | Admitting: Internal Medicine

## 2010-09-12 VITALS — BP 148/90 | HR 89 | Temp 97.7°F | Wt 167.0 lb

## 2010-09-12 DIAGNOSIS — R51 Headache: Secondary | ICD-10-CM

## 2010-09-12 MED ORDER — KETOROLAC TROMETHAMINE 60 MG/2ML IJ SOLN
60.0000 mg | INTRAMUSCULAR | Status: DC
  Administered 2010-09-12: 60 mg via INTRAMUSCULAR
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
## 2010-09-12 NOTE — Patient Instructions (Signed)
Headache - atypical for migraine but possible. No neurologic abnormality on exam. Plan - injection in the office of ketorolac 60mg  IM. May incrase valium to 4 times a day to help with muscle spasm in the left neck and upper back. If you must you can take the lorcet. For unrelieved pain over the weekend you may need to go to the ER. For persistent headache will refer you to a headache specialist.

## 2010-10-24 ENCOUNTER — Ambulatory Visit (INDEPENDENT_AMBULATORY_CARE_PROVIDER_SITE_OTHER): Payer: BC Managed Care – PPO | Admitting: Internal Medicine

## 2010-10-24 VITALS — BP 110/70 | HR 88 | Temp 97.5°F | Wt 168.0 lb

## 2010-10-24 DIAGNOSIS — R1013 Epigastric pain: Secondary | ICD-10-CM

## 2010-10-24 DIAGNOSIS — K3189 Other diseases of stomach and duodenum: Secondary | ICD-10-CM

## 2010-10-24 DIAGNOSIS — IMO0001 Reserved for inherently not codable concepts without codable children: Secondary | ICD-10-CM

## 2010-10-24 DIAGNOSIS — M1611 Unilateral primary osteoarthritis, right hip: Secondary | ICD-10-CM

## 2010-10-24 DIAGNOSIS — M797 Fibromyalgia: Secondary | ICD-10-CM

## 2010-10-24 DIAGNOSIS — M1610 Unilateral primary osteoarthritis, unspecified hip: Secondary | ICD-10-CM

## 2010-10-24 MED ORDER — HYDROCODONE-ACETAMINOPHEN 5-325 MG PO TABS: 1.0000 | ORAL_TABLET | Freq: Three times a day (TID) | ORAL | Status: AC | PRN

## 2010-10-28 DIAGNOSIS — M797 Fibromyalgia: Secondary | ICD-10-CM | POA: Insufficient documentation

## 2010-10-28 NOTE — Progress Notes (Signed)
  Subjective:    Patient ID: [name] [name], female    DOB: October 22, 1945, 65 y.o.   MRN: [id number]  HPI Mrs. Vandehei presents for evluatinof right low back pain that starts in the buttock as a sharp pain and radiates down the leg as a dull ache. It is worse standing, better when lying down. This has not disabled her and she was able to ambulate . She was seen at Urgent Care, diagnosed with siatica and was given hydrocodeone/APAP for pain which does help, 800 mg ibuprofen was not helpful. She denies any paresthesia or focal weakness. She has not had any injury. The pain is not improved by walking.   She also c/o chest buring that persist through the day and is especially worse after meals. This has persisted for 2 weeks. She gets no relief from zantac.  I have reviewed the patient's medical history in detail and updated the computerized patient record.    Review of Systems System review is negative for any constitutional, cardiac, pulmonary, GI or neuro symptoms or complaints     Objective:   Physical Exam Vitals reviewed Gen'l - an overweight white woman in no distress HEENT - [place]/AT, C&S clear, PERRLA Neck- supple, Cor - 2+ radial pulse, RRR Pul - normal respirations. BAck - Back exam: normal stand; normal flex to greater than 100 degrees; normal gait; normal toe/heel walk; normal step up to exam table; normal SLR sitting; normal DTRs at the patellar tendons; normal sensation to light touch, pin-prick and deep vibratory stimulus; no  CVA tenderness; able to move supine to sitting witout assistance. Left Hip - no tenderness with internal or external rotation, to AP pressure or pressure against the greater trochanter. Right hip- tneder with external rotation, to AP pressure and to lateral pressure against the greater trochanter.         Assessment & Plan:

## 2010-10-28 NOTE — Assessment & Plan Note (Signed)
Possibly exacerbated by [place] e of NSAIDs.  Plan - PPI therapy, otc, each AM

## 2010-10-28 NOTE — Assessment & Plan Note (Signed)
Patiet with mild pain from buttock to leg in a pattern of scutica. Exam and radiographic studies suggest the origin of pain is the hip due to mild DJD.  Plan - stretch and flex for the hip and the adjacent musculature           Rx: APAP 500 mg q8 and naproxen 220 bid. May use limited amounts of hydorcodone/APAP for severe pain - cautioned about the cumulative dose of APAP.          Refer to PT for instruction in exercise.

## 2010-11-04 ENCOUNTER — Ambulatory Visit: Payer: BC Managed Care – PPO | Attending: Internal Medicine

## 2010-11-04 DIAGNOSIS — IMO0001 Reserved for inherently not codable concepts without codable children: Secondary | ICD-10-CM | POA: Insufficient documentation

## 2010-11-04 DIAGNOSIS — M545 Low back pain, unspecified: Secondary | ICD-10-CM | POA: Insufficient documentation

## 2010-11-04 DIAGNOSIS — M25569 Pain in unspecified knee: Secondary | ICD-10-CM | POA: Insufficient documentation

## 2010-11-04 DIAGNOSIS — M25559 Pain in unspecified hip: Secondary | ICD-10-CM | POA: Insufficient documentation

## 2010-11-04 DIAGNOSIS — M2560 Stiffness of unspecified joint, not elsewhere classified: Secondary | ICD-10-CM | POA: Insufficient documentation

## 2010-11-06 ENCOUNTER — Ambulatory Visit: Payer: BC Managed Care – PPO | Admitting: Physical Therapy

## 2010-11-12 ENCOUNTER — Ambulatory Visit: Payer: BC Managed Care – PPO | Attending: Internal Medicine | Admitting: Physical Therapy

## 2010-11-12 DIAGNOSIS — M545 Low back pain, unspecified: Secondary | ICD-10-CM | POA: Insufficient documentation

## 2010-11-12 DIAGNOSIS — IMO0001 Reserved for inherently not codable concepts without codable children: Secondary | ICD-10-CM | POA: Insufficient documentation

## 2010-11-12 DIAGNOSIS — M2560 Stiffness of unspecified joint, not elsewhere classified: Secondary | ICD-10-CM | POA: Insufficient documentation

## 2010-11-12 DIAGNOSIS — M25559 Pain in unspecified hip: Secondary | ICD-10-CM | POA: Insufficient documentation

## 2010-11-12 DIAGNOSIS — M25569 Pain in unspecified knee: Secondary | ICD-10-CM | POA: Insufficient documentation

## 2010-11-17 ENCOUNTER — Encounter: Payer: BC Managed Care – PPO | Admitting: Physical Therapy

## 2010-11-18 ENCOUNTER — Other Ambulatory Visit: Payer: Self-pay | Admitting: *Deleted

## 2010-11-18 NOTE — Telephone Encounter (Signed)
Ok for refill  x3 

## 2010-11-18 NOTE — Telephone Encounter (Signed)
Pt requesting refill on hydrocodone please Advise

## 2010-11-19 ENCOUNTER — Ambulatory Visit: Payer: BC Managed Care – PPO | Admitting: Physical Therapy

## 2010-11-19 MED ORDER — HYDROCODONE-ACETAMINOPHEN 5-325 MG PO TABS: 1.0000 | ORAL_TABLET | ORAL | Status: DC | PRN

## 2010-11-21 ENCOUNTER — Encounter: Payer: BC Managed Care – PPO | Admitting: Physical Therapy

## 2010-11-24 ENCOUNTER — Ambulatory Visit: Payer: BC Managed Care – PPO | Admitting: Physical Therapy

## 2010-11-26 ENCOUNTER — Encounter: Payer: BC Managed Care – PPO | Admitting: Physical Therapy

## 2010-11-27 ENCOUNTER — Encounter: Payer: BC Managed Care – PPO | Admitting: Physical Therapy

## 2010-12-01 ENCOUNTER — Ambulatory Visit: Payer: BC Managed Care – PPO | Admitting: Physical Therapy

## 2010-12-03 ENCOUNTER — Ambulatory Visit: Payer: BC Managed Care – PPO | Admitting: Physical Therapy

## 2010-12-04 ENCOUNTER — Ambulatory Visit: Payer: BC Managed Care – PPO | Admitting: Physical Therapy

## 2010-12-05 ENCOUNTER — Ambulatory Visit (INDEPENDENT_AMBULATORY_CARE_PROVIDER_SITE_OTHER): Payer: BC Managed Care – PPO | Admitting: Internal Medicine

## 2010-12-05 VITALS — BP 112/70 | HR 89 | Temp 98.2°F | Wt 166.0 lb

## 2010-12-05 DIAGNOSIS — Z23 Encounter for immunization: Secondary | ICD-10-CM

## 2010-12-05 DIAGNOSIS — M79604 Pain in right leg: Secondary | ICD-10-CM

## 2010-12-05 DIAGNOSIS — M25559 Pain in unspecified hip: Secondary | ICD-10-CM

## 2010-12-05 DIAGNOSIS — M545 Low back pain, unspecified: Secondary | ICD-10-CM

## 2010-12-05 DIAGNOSIS — M1611 Unilateral primary osteoarthritis, right hip: Secondary | ICD-10-CM

## 2010-12-05 DIAGNOSIS — M25551 Pain in right hip: Secondary | ICD-10-CM

## 2010-12-05 DIAGNOSIS — M1610 Unilateral primary osteoarthritis, unspecified hip: Secondary | ICD-10-CM

## 2010-12-05 MED ORDER — HYDROCODONE-ACETAMINOPHEN 5-325 MG PO TABS: 1.0000 | ORAL_TABLET | ORAL | Status: DC | PRN
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
## 2010-12-05 NOTE — Patient Instructions (Signed)
Right low back, hip and leg pain. Exam does NOT reveal severe signs of pinched nerves in the back. The pain may be coming from the back - disc disease, or the hip. If the later you can also get severe muscle inflammation that can then cause pain in the quadriceps and further down the leg. Plan - return Monday for bilateral hip x-rays and L-S Spine x-rays. Start taking aleve 2 tablets in the AM and 2 tablets in the PM. Watch out for any stomach irritation or change in the stool - black or tarry stools. The valium you take should serve double duty as a muscle relaxant. Further testing or treatment recommendations will be based on the x-ray results.

## 2010-12-07 NOTE — Assessment & Plan Note (Signed)
Patients exam is notable for right sided pain but it is not clear if this is originating from the lumbar spin or if this is hip pain with associated muscle inflammation, e.g. Piriformis or quadricep inflammation.  Plan - L-S spine x-ray and bilateral hip x-rays to help make a determination.            Recommendations to follow

## 2010-12-08 ENCOUNTER — Other Ambulatory Visit: Payer: Self-pay | Admitting: Internal Medicine

## 2010-12-08 ENCOUNTER — Ambulatory Visit (INDEPENDENT_AMBULATORY_CARE_PROVIDER_SITE_OTHER)
Admission: RE | Admit: 2010-12-08 | Discharge: 2010-12-08 | Disposition: A | Discharge status: Home / Self Care | Payer: BC Managed Care – PPO | Source: Ambulatory Visit | Attending: Internal Medicine | Admitting: Internal Medicine

## 2010-12-08 DIAGNOSIS — M25551 Pain in right hip: Secondary | ICD-10-CM

## 2010-12-08 DIAGNOSIS — Z23 Encounter for immunization: Secondary | ICD-10-CM

## 2010-12-08 DIAGNOSIS — M545 Low back pain, unspecified: Secondary | ICD-10-CM

## 2010-12-08 DIAGNOSIS — M25559 Pain in unspecified hip: Secondary | ICD-10-CM

## 2010-12-10 ENCOUNTER — Encounter: Payer: Self-pay | Admitting: Internal Medicine

## 2010-12-12 LAB — CBC
HCT: 40
Hemoglobin: 13.9
MCHC: 34.7
MCV: 91.3
Platelets: 264
RBC: 4.38
RDW: 12.8
WBC: 10.6 — ABNORMAL HIGH

## 2010-12-12 LAB — RAPID URINE DRUG SCREEN, HOSP PERFORMED
Amphetamines: NOT DETECTED
Barbiturates: NOT DETECTED
Benzodiazepines: NOT DETECTED
Cocaine: NOT DETECTED
Opiates: NOT DETECTED
Tetrahydrocannabinol: NOT DETECTED

## 2010-12-12 LAB — COMPREHENSIVE METABOLIC PANEL
ALT: 22
AST: 22
Albumin: 3.6
Alkaline Phosphatase: 70
BUN: 10
CO2: 27
Calcium: 9.2
Chloride: 105
Creatinine, Ser: 0.95
GFR calc Af Amer: >60
GFR calc non Af Amer: >60
Glucose, Bld: 149 — ABNORMAL HIGH
Potassium: 3.8
Sodium: 141
Total Bilirubin: 0.7
Total Protein: 6.2

## 2010-12-12 LAB — ETHANOL: Alcohol, Ethyl (B): 154 — ABNORMAL HIGH

## 2010-12-12 LAB — LIPASE, BLOOD: Lipase: 30

## 2010-12-16 LAB — BASIC METABOLIC PANEL
BUN: 11
CO2: 27
Calcium: 9.5
Chloride: 103
Creatinine, Ser: 0.95
GFR calc Af Amer: >60
GFR calc non Af Amer: >60
Glucose, Bld: 112 — ABNORMAL HIGH
Potassium: 3.6
Sodium: 139

## 2010-12-16 LAB — RAPID URINE DRUG SCREEN, HOSP PERFORMED
Amphetamines: POSITIVE — AB
Barbiturates: NOT DETECTED
Benzodiazepines: POSITIVE — AB
Cocaine: NOT DETECTED
Opiates: NOT DETECTED
Tetrahydrocannabinol: NOT DETECTED

## 2010-12-16 LAB — BENZODIAZEPINE, QUANTITATIVE, URINE
Alprazolam (GC/LC/MS), ur confirm: NEGATIVE
Flurazepam GC/MS Conf: NEGATIVE
Nordiazepam GC/MS Conf: 180 ng/mL
Oxazepam GC/MS Conf: 140 ng/mL

## 2010-12-16 LAB — DRUGS OF ABUSE SCREEN W/O ALC, ROUTINE URINE
Amphetamine Screen, Ur: NEGATIVE
Barbiturate Quant, Ur: NEGATIVE
Benzodiazepines.: POSITIVE — AB
Cocaine Metabolites: NEGATIVE
Creatinine,U: 54.4
Marijuana Metabolite: NEGATIVE
Methadone: NEGATIVE
Opiate Screen, Urine: NEGATIVE
Phencyclidine (PCP): NEGATIVE
Propoxyphene: NEGATIVE

## 2010-12-16 LAB — ETHANOL: Alcohol, Ethyl (B): <5

## 2010-12-17 ENCOUNTER — Telehealth: Payer: Self-pay | Admitting: *Deleted

## 2010-12-18 ENCOUNTER — Ambulatory Visit (INDEPENDENT_AMBULATORY_CARE_PROVIDER_SITE_OTHER): Payer: BC Managed Care – PPO | Admitting: Internal Medicine

## 2010-12-18 VITALS — BP 110/78 | HR 78 | Temp 98.7°F | Wt 162.0 lb

## 2010-12-18 DIAGNOSIS — M5430 Sciatica, unspecified side: Secondary | ICD-10-CM

## 2010-12-18 DIAGNOSIS — M5431 Sciatica, right side: Secondary | ICD-10-CM

## 2010-12-18 MED ORDER — GABAPENTIN 300 MG PO CAPS: 300.0000 mg | ORAL_CAPSULE | Freq: Four times a day (QID) | ORAL | Status: DC

## 2010-12-18 MED ORDER — OXYCODONE HCL 20 MG PO TB12: 20.0000 mg | ORAL_TABLET | Freq: Two times a day (BID) | ORAL | Status: DC | PRN

## 2010-12-18 NOTE — Progress Notes (Signed)
  Subjective:    Patient ID: [name] [name], female    DOB: May 03, 1945, 65 y.o.   MRN: [id number]  HPI multiple sclerosis [name] [name] was seen 9/28 for sciatica type pain in the right leg and hip. See previous note for exam. She did have bilateral hip films 10/1 read as normal. L0-S spine films were read out as having fairly well preserved disk spaces except L2-3, L3-4. IN the interval she reports her pain has become much worse. She has not had any paresthesia or muscle weakness. She did not get relief with first pain medication. Urgent Care started oxycodone 5 mg which helps for only a few hours.   Reviewd the x-ray images with the patient - did see open disk spaces.    Review of Systems System review is negative for any constitutional, cardiac, pulmonary, GI or neuro symptoms or complaints other than as described in the HPI.     Objective:   Physical Exam Vitals - reviewed Gen'l - WNWD white female in no acute distress but uncomfortable Cor - RRR Pulm - normal respirations MSk - able to stand w/o assist, gait - walks w/o assist device with good balance.       Assessment & Plan:

## 2010-12-18 NOTE — Patient Instructions (Signed)
Severe pain right low back and right leg suggestive of sciatica. The x-rays of the lumbar spine show minimal loss of disk height but there can still be a disk related problem.   Plan - oxycontin 20 mg every 12 hours.           For break through pain may take a 5/325 percocet every 6 hours          For nerve related pain may take gabapentin 300 mg , 1/2 tablet 3 times a day  And may increase to a full tablet 3 times a day in the second week.

## 2010-12-19 ENCOUNTER — Telehealth: Payer: Self-pay | Admitting: *Deleted

## 2010-12-19 DIAGNOSIS — M5430 Sciatica, unspecified side: Secondary | ICD-10-CM | POA: Insufficient documentation

## 2010-12-19 LAB — URINE DRUGS OF ABUSE SCREEN W ALC, ROUTINE (REF LAB)
Amphetamine Screen, Ur: NEGATIVE
Barbiturate Quant, Ur: NEGATIVE
Benzodiazepines.: POSITIVE — AB
Cocaine Metabolites: NEGATIVE
Creatinine,U: 30.1
Ethyl Alcohol: <5
Marijuana Metabolite: NEGATIVE
Methadone: NEGATIVE
Opiate Screen, Urine: NEGATIVE
Phencyclidine (PCP): NEGATIVE
Propoxyphene: NEGATIVE

## 2010-12-19 LAB — BENZODIAZEPINE, QUANTITATIVE, URINE
Alprazolam (GC/LC/MS), ur confirm: NEGATIVE
Flurazepam GC/MS Conf: NEGATIVE
Nordiazepam GC/MS Conf: NEGATIVE
Oxazepam GC/MS Conf: 140 ng/mL

## 2010-12-19 NOTE — Assessment & Plan Note (Signed)
Patinet with persistent pain despite conservative therapy. X-ray study was reviewed with her, both images and report. Her pain is disproportionate to her x-ray.  Plan - MRI w/lo contrast lumbar spine.

## 2010-12-19 NOTE — Telephone Encounter (Signed)
Called pt. She had not been taking gabapentin - could not split a capsule. Advised to start Gaba - 1 tonight, bid tomorrow, tid the next day and the qid. No increase in oxy

## 2010-12-19 NOTE — Telephone Encounter (Signed)
Pt states Oxycontin 20 mg bid is not helping. She states it is not even touching her pain. Please Advise

## 2010-12-22 ENCOUNTER — Ambulatory Visit
Admission: RE | Admit: 2010-12-22 | Discharge: 2010-12-22 | Disposition: A | Discharge status: Home / Self Care | Payer: BC Managed Care – PPO | Source: Ambulatory Visit | Attending: Internal Medicine | Admitting: Internal Medicine

## 2010-12-22 DIAGNOSIS — M5431 Sciatica, right side: Secondary | ICD-10-CM

## 2010-12-22 LAB — BENZODIAZEPINE, QUANTITATIVE, URINE
Alprazolam (GC/LC/MS), ur confirm: NEGATIVE
Flurazepam GC/MS Conf: NEGATIVE
Nordiazepam GC/MS Conf: 190 ng/mL
Oxazepam GC/MS Conf: 230 ng/mL

## 2010-12-22 LAB — CBC
HCT: 39.7
Hemoglobin: 13.9
MCHC: 34.9
MCV: 93.1
Platelets: 236
RBC: 4.27
RDW: 12.2
WBC: 6.4

## 2010-12-22 LAB — URINALYSIS, ROUTINE W REFLEX MICROSCOPIC
Bilirubin Urine: NEGATIVE
Glucose, UA: NEGATIVE
Hgb urine dipstick: NEGATIVE
Ketones, ur: NEGATIVE
Nitrite: NEGATIVE
Protein, ur: NEGATIVE
Specific Gravity, Urine: 1.02
Urobilinogen, UA: 0.2
pH: 6.5

## 2010-12-22 LAB — DIFFERENTIAL
Basophils Absolute: 0
Basophils Relative: 0
Eosinophils Absolute: 0.1
Eosinophils Relative: 2
Lymphocytes Relative: 31
Lymphs Abs: 2
Monocytes Absolute: 0.4
Monocytes Relative: 6
Neutro Abs: 3.9
Neutrophils Relative %: 61

## 2010-12-22 LAB — RAPID URINE DRUG SCREEN, HOSP PERFORMED
Amphetamines: NOT DETECTED
Barbiturates: NOT DETECTED
Benzodiazepines: POSITIVE — AB
Cocaine: NOT DETECTED
Opiates: NOT DETECTED
Tetrahydrocannabinol: NOT DETECTED

## 2010-12-22 LAB — URINE DRUGS OF ABUSE SCREEN W ALC, ROUTINE (REF LAB)
Amphetamine Screen, Ur: NEGATIVE
Barbiturate Quant, Ur: NEGATIVE
Benzodiazepines.: POSITIVE — AB
Cocaine Metabolites: NEGATIVE
Creatinine,U: 63.8
Ethyl Alcohol: <5
Marijuana Metabolite: NEGATIVE
Methadone: NEGATIVE
Opiate Screen, Urine: NEGATIVE
Phencyclidine (PCP): NEGATIVE
Propoxyphene: NEGATIVE

## 2010-12-22 LAB — BASIC METABOLIC PANEL
BUN: 10
CO2: 25
Calcium: 8.9
Chloride: 106
Creatinine, Ser: 1.01
GFR calc Af Amer: >60
GFR calc non Af Amer: 56 — ABNORMAL LOW
Glucose, Bld: 107 — ABNORMAL HIGH
Potassium: 3.9
Sodium: 141

## 2010-12-22 LAB — URINE MICROSCOPIC-ADD ON

## 2010-12-22 LAB — ETHANOL: Alcohol, Ethyl (B): 89 — ABNORMAL HIGH

## 2010-12-22 NOTE — Telephone Encounter (Signed)
error 

## 2010-12-25 ENCOUNTER — Telehealth: Payer: Self-pay | Admitting: Internal Medicine

## 2010-12-25 ENCOUNTER — Emergency Department (HOSPITAL_COMMUNITY): Payer: BC Managed Care – PPO

## 2010-12-25 ENCOUNTER — Inpatient Hospital Stay (HOSPITAL_COMMUNITY)
Admission: EM | Admit: 2010-12-25 | Discharge: 2010-12-30 | DRG: 189 | Disposition: A | Discharge status: Home / Self Care | Payer: BC Managed Care – PPO | Attending: Internal Medicine | Admitting: Internal Medicine

## 2010-12-25 DIAGNOSIS — K55059 Acute (reversible) ischemia of intestine, part and extent unspecified: Principal | ICD-10-CM | POA: Diagnosis present

## 2010-12-25 DIAGNOSIS — F411 Generalized anxiety disorder: Secondary | ICD-10-CM | POA: Diagnosis present

## 2010-12-25 DIAGNOSIS — F3289 Other specified depressive episodes: Secondary | ICD-10-CM | POA: Diagnosis present

## 2010-12-25 DIAGNOSIS — R55 Syncope and collapse: Secondary | ICD-10-CM | POA: Diagnosis not present

## 2010-12-25 DIAGNOSIS — R112 Nausea with vomiting, unspecified: Secondary | ICD-10-CM | POA: Diagnosis present

## 2010-12-25 DIAGNOSIS — M5430 Sciatica, unspecified side: Secondary | ICD-10-CM | POA: Diagnosis present

## 2010-12-25 DIAGNOSIS — F329 Major depressive disorder, single episode, unspecified: Secondary | ICD-10-CM | POA: Diagnosis present

## 2010-12-25 LAB — BASIC METABOLIC PANEL
BUN: 9 mg/dL (ref 6–23)
CO2: 21 mEq/L (ref 19–32)
Calcium: 9.8 mg/dL (ref 8.4–10.5)
Chloride: 99 mEq/L (ref 96–112)
Creatinine, Ser: 0.78 mg/dL (ref 0.50–1.10)
GFR calc Af Amer: >90 mL/min (ref >90)
GFR calc non Af Amer: 88 mL/min — ABNORMAL LOW (ref >90)
Glucose, Bld: 150 mg/dL — ABNORMAL HIGH (ref 70–99)
Potassium: 3.7 mEq/L (ref 3.5–5.1)
Sodium: 134 mEq/L — ABNORMAL LOW (ref 135–145)

## 2010-12-25 LAB — CBC
HCT: 43 % (ref 36.0–46.0)
Hemoglobin: 15 g/dL (ref 12.0–15.0)
MCH: 31.4 pg (ref 26.0–34.0)
MCHC: 34.9 g/dL (ref 30.0–36.0)
MCV: 90.1 fL (ref 78.0–100.0)
Platelets: 231 10*3/uL (ref 150–400)
RBC: 4.77 MIL/uL (ref 3.87–5.11)
RDW: 12.5 % (ref 11.5–15.5)
WBC: 14.5 10*3/uL — ABNORMAL HIGH (ref 4.0–10.5)

## 2010-12-25 LAB — URINALYSIS, ROUTINE W REFLEX MICROSCOPIC
Bilirubin Urine: NEGATIVE
Glucose, UA: NEGATIVE mg/dL
Hgb urine dipstick: NEGATIVE
Ketones, ur: NEGATIVE mg/dL
Leukocytes, UA: NEGATIVE
Nitrite: NEGATIVE
Protein, ur: NEGATIVE mg/dL
Specific Gravity, Urine: 1.002 — ABNORMAL LOW (ref 1.005–1.030)
Urobilinogen, UA: 0.2 mg/dL (ref 0.0–1.0)
pH: 6.5 (ref 5.0–8.0)

## 2010-12-25 LAB — TSH: TSH: 0.809 u[IU]/mL (ref 0.350–4.500)

## 2010-12-25 MED ORDER — IOHEXOL 300 MG/ML  SOLN
100.0000 mL | Freq: Once | INTRAMUSCULAR | Status: AC | PRN
  Administered 2010-12-25: 100 mL via INTRAVENOUS

## 2010-12-25 NOTE — Telephone Encounter (Signed)
MRI without nerve root or spinal cord compression. No surgical problem.

## 2010-12-26 DIAGNOSIS — R933 Abnormal findings on diagnostic imaging of other parts of digestive tract: Secondary | ICD-10-CM

## 2010-12-26 DIAGNOSIS — K55059 Acute (reversible) ischemia of intestine, part and extent unspecified: Secondary | ICD-10-CM

## 2010-12-26 DIAGNOSIS — K5289 Other specified noninfective gastroenteritis and colitis: Secondary | ICD-10-CM

## 2010-12-26 LAB — COMPREHENSIVE METABOLIC PANEL
ALT: 9 U/L (ref 0–35)
AST: 12 U/L (ref 0–37)
Albumin: 2.6 g/dL — ABNORMAL LOW (ref 3.5–5.2)
Alkaline Phosphatase: 60 U/L (ref 39–117)
BUN: 6 mg/dL (ref 6–23)
CO2: 24 mEq/L (ref 19–32)
Calcium: 8.6 mg/dL (ref 8.4–10.5)
Chloride: 106 mEq/L (ref 96–112)
Creatinine, Ser: 0.98 mg/dL (ref 0.50–1.10)
GFR calc Af Amer: 70 mL/min — ABNORMAL LOW (ref >90)
GFR calc non Af Amer: 61 mL/min — ABNORMAL LOW (ref >90)
Glucose, Bld: 121 mg/dL — ABNORMAL HIGH (ref 70–99)
Potassium: 3.3 mEq/L — ABNORMAL LOW (ref 3.5–5.1)
Sodium: 139 mEq/L (ref 135–145)
Total Bilirubin: 0.4 mg/dL (ref 0.3–1.2)
Total Protein: 5.9 g/dL — ABNORMAL LOW (ref 6.0–8.3)

## 2010-12-26 LAB — DIFFERENTIAL
Basophils Absolute: 0 10*3/uL (ref 0.0–0.1)
Basophils Relative: 0 % (ref 0–1)
Eosinophils Absolute: 0.3 10*3/uL (ref 0.0–0.7)
Eosinophils Relative: 3 % (ref 0–5)
Lymphocytes Relative: 16 % (ref 12–46)
Lymphs Abs: 1.8 10*3/uL (ref 0.7–4.0)
Monocytes Absolute: 0.6 10*3/uL (ref 0.1–1.0)
Monocytes Relative: 5 % (ref 3–12)
Neutro Abs: 8.5 10*3/uL — ABNORMAL HIGH (ref 1.7–7.7)
Neutrophils Relative %: 76 % (ref 43–77)

## 2010-12-26 LAB — URINE CULTURE
Colony Count: 9000
Culture  Setup Time: 201210190047

## 2010-12-26 LAB — CBC
HCT: 38.1 % (ref 36.0–46.0)
Hemoglobin: 12.4 g/dL (ref 12.0–15.0)
MCH: 30.5 pg (ref 26.0–34.0)
MCHC: 32.5 g/dL (ref 30.0–36.0)
MCV: 93.8 fL (ref 78.0–100.0)
Platelets: 217 10*3/uL (ref 150–400)
RBC: 4.06 MIL/uL (ref 3.87–5.11)
RDW: 12.9 % (ref 11.5–15.5)
WBC: 11.1 10*3/uL — ABNORMAL HIGH (ref 4.0–10.5)

## 2010-12-26 LAB — PROTIME-INR
INR: 1.11 (ref 0.00–1.49)
Prothrombin Time: 14.5 seconds (ref 11.6–15.2)

## 2010-12-26 LAB — APTT: aPTT: 34 seconds (ref 24–37)

## 2010-12-26 LAB — HEMOGLOBIN A1C
Hgb A1c MFr Bld: 5.5 % (ref <5.7)
Mean Plasma Glucose: 111 mg/dL (ref <117)

## 2010-12-26 LAB — MAGNESIUM: Magnesium: 1.9 mg/dL (ref 1.5–2.5)

## 2010-12-26 LAB — PHOSPHORUS: Phosphorus: 2.9 mg/dL (ref 2.3–4.6)

## 2010-12-27 DIAGNOSIS — K5289 Other specified noninfective gastroenteritis and colitis: Secondary | ICD-10-CM

## 2010-12-27 DIAGNOSIS — K55059 Acute (reversible) ischemia of intestine, part and extent unspecified: Secondary | ICD-10-CM

## 2010-12-27 DIAGNOSIS — R933 Abnormal findings on diagnostic imaging of other parts of digestive tract: Secondary | ICD-10-CM

## 2010-12-28 LAB — CLOSTRIDIUM DIFFICILE BY PCR: Toxigenic C. Difficile by PCR: NEGATIVE

## 2010-12-28 LAB — FECAL LACTOFERRIN, QUANT: Fecal Lactoferrin: POSITIVE

## 2010-12-29 LAB — CBC
HCT: 38.1 % (ref 36.0–46.0)
Hemoglobin: 12.6 g/dL (ref 12.0–15.0)
MCH: 30.3 pg (ref 26.0–34.0)
MCHC: 33.1 g/dL (ref 30.0–36.0)
MCV: 91.6 fL (ref 78.0–100.0)
Platelets: 196 10*3/uL (ref 150–400)
RBC: 4.16 MIL/uL (ref 3.87–5.11)
RDW: 12.4 % (ref 11.5–15.5)
WBC: 7.2 10*3/uL (ref 4.0–10.5)

## 2010-12-29 LAB — SEDIMENTATION RATE: Sed Rate: 20 mm/hr (ref 0–22)

## 2010-12-30 DIAGNOSIS — R933 Abnormal findings on diagnostic imaging of other parts of digestive tract: Secondary | ICD-10-CM

## 2010-12-30 DIAGNOSIS — K5289 Other specified noninfective gastroenteritis and colitis: Secondary | ICD-10-CM

## 2010-12-30 DIAGNOSIS — K55059 Acute (reversible) ischemia of intestine, part and extent unspecified: Secondary | ICD-10-CM

## 2010-12-30 NOTE — H&P (Signed)
NAME:  [name] [name], [name] [name] NO.:  [id number]  MEDICAL RECORD NO.:  [id number]  LOCATION:  WLED                         FACILITY:  [facility]  PHYSICIAN:  Kathlen Mody, MD       DATE OF BIRTH:  1946/02/29  DATE OF ADMISSION:  12/25/2010 DATE OF DISCHARGE:                             HISTORY & PHYSICAL   PRIMARY CARE PHYSICIAN:  Dr. Debby Bud.  CHIEF COMPLAINT:  Nausea, vomiting, abdominal pain, and bloody diarrhea since yesterday.  HISTORY OF PRESENT ILLNESS:  This 65 year old lady with history of anxiety, depression, and right hip pain and sciatica, has been on hydrocodone over the last couple of weeks for leg pain, and felt she was constipated from the narcotic pain medication, took some stool softeners and laxatives and since yesterday, the patient started having nausea with persistent vomiting.  Vomiting is nonbilious and nonbloody and associated with severe crampy lower quadrant abdominal pain associated with more than 3 episodes of bloody diarrhea.  The patient denies any fever.  Denies any urinary complaints.  Denies any similar complaints in the past.  The patient had a colonoscopy at the age of 26 and was told she had a normal colon.  She denies any fever, chills, chest pain, or shortness of breath.  She denies any headache or blurry vision.  Denies any tingling or numbness.  REVIEW OF SYSTEMS:  See HPI, otherwise negative.  PAST MEDICAL HISTORY: 1. Anxiety. 2. Depression. 3. Right-sided sciatica.  PAST SURGICAL HISTORY:  The patient had a hysterectomy.  FAMILY HISTORY:  Nonsignificant.  HOME MEDICATIONS:  Please see med rec for detailed medications and their doses.  SOCIAL HISTORY:  The patient quit alcohol 3 years ago.  Does not smoke. No recreational drug use.  Lives at home with her husband.  PHYSICAL EXAMINATION:  VITAL SIGNS:  She is afebrile.  Blood pressure 130/70, pulse of 90 per minute, respiratory rate 18, and saturating 98% on room  air. GENERAL:  On exam, she is alert and afebrile in mild distress from lower quadrant abdominal pain. HEENT:  Pupils reacting to light and accommodation.  No scleral icterus. Dry mucous membranes. NECK:  No JVD. CARDIOVASCULAR:  S1 and S2 heard. RESPIRATORY:  Chest is clear to auscultation bilaterally. ABDOMEN:  Soft and tender in the lower quadrant, left lower and right lower quadrants.  Bowel sounds are heard.  No signs of peritonitis.  No rebound tenderness. EXTREMITIES:  No pedal edema, cyanosis, or clubbing. NEUROLOGICAL:  No focal deficits.  PERTINENT LABORATORY DATA:  The patient had urinalysis, which is negative for nitrites and leukocyte esterase.  Basic metabolic panel is significant for sodium of 134 and glucose of 150.  CBC showed an elevated WBC count of 14.5.  RADIOLOGY:  The patient had a CT abdomen and pelvis with contrast shows diffuse descending and sigmoid colitis, not clear inflammatory or infectious in origin, mild chronic UPJ obstruction on the right.  ASSESSMENT AND PLAN:  A 65 year old lady with history of anxiety, depression, and sciatica, admitted for persistent nausea, vomiting, abdominal pain, and bloody diarrhea, was found to have diffuse descending sigmoid colitis.  We will admit the patient for IV antibiotics, IV Zosyn, dosing as per the  pharmacy, clear liquid diet, IV Zofran, and IV morphine for severe pain control.  Her hemoglobin appears to be stable.  We will give her IV fluids, normal saline at 125 mL.  Anxiety and depression.  We will continue with home medications of Seroquel and Abilify for anxiety and depression.  Deep venous thrombosis prophylaxis, SCDs.  The patient is full code.          ______________________________ Kathlen Mody, MD     VA/MEDQ  D:  12/25/2010  T:  12/25/2010  Job:  [id number]  Electronically Signed by Kathlen Mody MD on 12/30/2010 03:04:12 PM

## 2010-12-31 LAB — STOOL CULTURE

## 2011-01-06 ENCOUNTER — Ambulatory Visit (INDEPENDENT_AMBULATORY_CARE_PROVIDER_SITE_OTHER): Payer: BC Managed Care – PPO | Admitting: Internal Medicine

## 2011-01-06 DIAGNOSIS — K55039 Acute (reversible) ischemia of large intestine, extent unspecified: Secondary | ICD-10-CM

## 2011-01-06 DIAGNOSIS — N760 Acute vaginitis: Secondary | ICD-10-CM

## 2011-01-06 DIAGNOSIS — M5430 Sciatica, unspecified side: Secondary | ICD-10-CM

## 2011-01-06 DIAGNOSIS — K55059 Acute (reversible) ischemia of intestine, part and extent unspecified: Secondary | ICD-10-CM

## 2011-01-06 DIAGNOSIS — M1610 Unilateral primary osteoarthritis, unspecified hip: Secondary | ICD-10-CM

## 2011-01-06 DIAGNOSIS — M1611 Unilateral primary osteoarthritis, right hip: Secondary | ICD-10-CM

## 2011-01-06 DIAGNOSIS — K5900 Constipation, unspecified: Secondary | ICD-10-CM

## 2011-01-06 MED ORDER — TERCONAZOLE 0.8 % VA CREA: 1.0000 | TOPICAL_CREAM | Freq: Every day | VAGINAL | Status: AC

## 2011-01-06 MED ORDER — ESCITALOPRAM OXALATE 5 MG PO TABS: 5.0000 mg | ORAL_TABLET | Freq: Every day | ORAL | Status: DC

## 2011-01-06 NOTE — Assessment & Plan Note (Signed)
Reviewed xray report and images - well preserved joint space with no indication of destructive arthritis  Plan - NSAIDs as needed for hip pain.

## 2011-01-06 NOTE — Progress Notes (Signed)
Does this pt have an appointment with me?

## 2011-01-06 NOTE — Assessment & Plan Note (Signed)
Full recovery with no residual signs or symptoms.  Plan - follow-up colonoscopy with Dr. Juanda Chance

## 2011-01-06 NOTE — Assessment & Plan Note (Signed)
Reviewed MRI report with patient - normal spine.

## 2011-01-06 NOTE — Progress Notes (Signed)
  Subjective:    Patient ID: [name] [name], female    DOB: 1945-07-17, 65 y.o.   MRN: [id number]  HPI [name] [name] presents for hospital follow-up. She had been admitted with hematochezia and severe abdominal pain that was thought due to ischemic colitis. Over a period of several days her symptoms did resolve. She had a flexible sigmoidoscopy that confirmed a diagnosis of ischemic colitis. She was discharged home in good condition.  Since d/c she has had no recurrent abdominal pain. She does report that she has chronic constipation, exacerbated by narcotic use, and has only had two BMs since being home, neither with any sign of blood.   She reports that she does have a vaginitis with discharge attributed to antibiotics. She only responds to terazol.   Reviewed the x-ray reports: MRI lumbar spine 12/22/10 - negative; bilateral hip films 12/08/10 - normal hips. She will still have occasional pain in the hip area.   I have reviewed the patient's medical history in detail and updated the computerized patient record.   Review of Systems System review is negative for any constitutional, cardiac, pulmonary, GI or neuro symptoms or complaints other than as described in the HPI.     Objective:   Physical Exam Vitals reviewed - normal Gen'l - WNWD white woman in no distress Abdomen - BS hypoactive, no guarding, rebound or tenderness to palpation       Assessment & Plan:  1. Vaginitis - patient with post-antibiotic vaginal discharge c/w vaginitis  Plan terazol cream - vag applicator as directed  2. Constipation - a chronic problem in part due to narcotic pain meds.  Plan - MOM 15-30 cc qhs.

## 2011-01-06 NOTE — Patient Instructions (Signed)
Ischemic colitis - resolved. I will contact Dr. Juanda Chance about whether you need an office consult of direct scheduling for colonoscopy.  For vaginitis - Rx for terazol sent to pharmacy  For constipation - try Milk of Magnesia 15-30 cc (1/2 or 1 capful) at bedtime daily.

## 2011-01-07 NOTE — Progress Notes (Signed)
I would prefer an OV first to make sure she is strong enough for ?? Colonoscopy.

## 2011-01-08 NOTE — Discharge Summary (Signed)
NAMEMarland Kitchen  [name] [name], [name] [name] NO.:  [id number]  MEDICAL RECORD NO.:  [id number]  LOCATION:  1532                         FACILITY:  [facility]  PHYSICIAN:  [name] Gess. Uchenna Rappaport, MD  DATE OF BIRTH:  1945-06-12  DATE OF ADMISSION:  12/25/2010 DATE OF DISCHARGE:  12/30/2010                              DISCHARGE SUMMARY   ADMITTING DIAGNOSIS:  Diffuse descending sigmoid colitis.  DISCHARGE DIAGNOSIS:  Ischemic colitis per flexible sigmoidoscopy.  CONSULTANTS: 1. [name] Morton. Juanda Chance, MD 2. [facility].  PROCEDURES: 1. CT scan of the abdomen and pelvis on the day of admission, which     showed diffuse descending sigmoid colitis.  This is likely     inflammatory versus infection in origin, no findings to suggest     ischemic colitis on CT scan.  Mild chronic UPJ obstruction on the     right, no other significant findings. 2. Flexible sigmoidoscopy performed on October 22, which revealed thepatient to have ischemic colitis type changes.  There is     superficial serpiginous ulcers, edema, and friable mucosa, all     consistent with the same.  She has severe diverticulosis in the     sigmoid colon.  HISTORY OF PRESENT ILLNESS:  [name] [name] is a 65 year old woman with history of anxiety, depression, and sciatica.  She has been taking hydrocodone for leg pain.  She felt she was constipated secondary to narcotic pain medication, took stool softeners and laxatives.  Since the day prior to admission, she was having nausea with persistent vomiting, which was nonbilious and nonbloody in nature.  She had associated severe cramps in the lower abdominal area and had had 3 episodes of bloody diarrhea prior to coming to the emergency department.  The patient had no fevers or chills.  She had no urinary symptoms.  The patient had previous colonoscopy at age 65 that was normal.  Because of bloody stools and CTA which showed what appeared to be acute colitis, she was admitted to the  hospital.  Please see the H and P for past medical history, family history, and social history as well as epic office records for past medical history, family history, social history as well.  HOSPITAL COURSE:  GI.  The patient was presumed to have colitis of possible infectious nature and was started on Zosyn.  Her white count rapidly defervesced.  The patient did have stool cultures sent for C difficile and routine pathogens.  This returned as showing yeast in the stool, reduced normal flora.  She had positive lactoferrin.  C difficile was negative.  The patient steadily improved with decreased pain, no recurrent bloody stools.  She was seen in consultation by the GI service who had added Flagyl to her regimen.  The patient did come to flexible sigmoidoscopy on the 22nd as noted with changes suggestive of ischemic colitis.  No evidence of infection.  With the patient's symptoms having improved with her being able to take a diet with her having no significant abdominal pain and no bloody diarrhea with stool cultures being negative, she was thought to be stable and ready for discharge home.  She will be following a low-residue diet.  She does not need additional antibiotics with stool cultures being negative.  It has been advised that she has a full colonoscopy in 1-2 months following discharge.  Patient's other medical problems remained stable.  She was continued on her home psychiatric medications and pain medications.  DISCHARGE EXAMINATION:  VITAL SIGNS:  Temperature was 98, blood pressure 134/87, pulse was 75, respirations 18, O2 sats 93% on room air. GENERAL APPEARANCE:  This is a mildly overweight Caucasian woman lying in bed who is in no acute distress. HEENT:  Normocephalic and atraumatic.  Conjunctivae and sclerae were clear without jaundice or icterus.  Pupils were equal, round, and reactive. CHEST:  Patient was moving air well with no increased work of  breathing. CARDIOVASCULAR:  2+ radial pulse.  She had a regular rate and rhythm without murmurs, rubs, or gallops. ABDOMEN:  The patient has positive bowel sounds in all 4 quadrants.  She had no guarding or rebound.  There was no particular tenderness to deep palpation.  No further examination conducted.  FINAL LABORATORY:  Sedimentation rate from 22nd was normal at 20, CBCs from the 22nd with a white count of 7200, hemoglobin 12.6 g, platelet count 196,000.  Fecal lactoferrin was positive.  C difficile culture was negative.  Urine culture was negative.  Phosphorus and magnesium on the 19th were normal.  Comprehensive metabolic panel on the 19th was unremarkable except for a glucose of 121, potassium was 3.3, sodium 139, chloride 106, CO2 of 24, BUN of 6, creatinine of 0.88.  Hemoglobin A1c from October 18th was 5.5%.  TSH from the 18th was 0.8.  DISPOSITION:  Patient is discharged to home.  She will call to make an appointment with Dr. Lina [name] in Gastroenterology.  She will be notified for an office visit in 7-10 days for followup.  The patient's condition at the time of discharge dictation is stable and improved.     [name] Gess Kimbra Marcelino, MD     MEN/MEDQ  D:  12/30/2010  T:  12/30/2010  Job:  [id number]  cc:   [name] Morton. Juanda Chance, MD [address] [place] [address]  Electronically Signed by [name] Regulus MD on 01/08/2011 05:59:58 PM

## 2011-01-09 ENCOUNTER — Telehealth: Payer: Self-pay | Admitting: Internal Medicine

## 2011-01-09 MED ORDER — LACTULOSE 10 GM/15ML PO SOLN: ORAL | Status: DC

## 2011-01-09 NOTE — Telephone Encounter (Signed)
Pt called and is requesting a medicine called into for constipation.  She stated the milk of magnesium is not helping.    Thanks!

## 2011-01-09 NOTE — Telephone Encounter (Signed)
Rx sent in for lactulose 30 cc tid prn

## 2011-01-09 NOTE — Telephone Encounter (Signed)
Informed pt .

## 2011-01-19 ENCOUNTER — Telehealth: Payer: Self-pay | Admitting: *Deleted

## 2011-01-19 DIAGNOSIS — M545 Low back pain, unspecified: Secondary | ICD-10-CM

## 2011-01-19 DIAGNOSIS — Z23 Encounter for immunization: Secondary | ICD-10-CM

## 2011-01-19 DIAGNOSIS — M25551 Pain in right hip: Secondary | ICD-10-CM

## 2011-01-19 NOTE — Telephone Encounter (Signed)
Pt requesting refill on hydrocodone, please advise

## 2011-01-19 NOTE — Telephone Encounter (Signed)
No, she is too early for a refill

## 2011-01-20 NOTE — Telephone Encounter (Signed)
Reviewed last hospitalization and med rec @ d/c: she was to take oxycontin 20mg  bid and hydrocodone q 4 for breakthrough pain. Reviewed last office note 01/06/11 - no record of d/c of oxycontin or new instructions on hydrocodone.  If she is out will start a tight control program: she may have hydrocodone/apap 5/325 1 po qid x 10 days, #40. After 10 days she may have new Rx 1 po tid x 10 days, #30; after 10 days new RX 1 po bid # 20  To be followed by an office visit before any additional Rx. She may be seen sooner as needed.

## 2011-01-20 NOTE — Telephone Encounter (Signed)
Patient informed and asked how she is taking the medication as she according to pharmacy, has filled Rx on 12/05/10 [#60], 12/09/10, 12/22/10, 01/06/11, & 01/14/11--Pt states she is out of medication. Pt does not remember when she filled the Rx and states that she spoke w/MD about increasing from 1 to 2 tablets at a time, but she doesn't remember how often she was told to take this dosage & states she takes them when needed; but can't tell me how often she has been taking. Pt is concerned about becoming addicted to medication and would like some recourse as how to taper off medication.

## 2011-01-21 MED ORDER — HYDROCODONE-ACETAMINOPHEN 5-325 MG PO TABS: ORAL_TABLET | ORAL | Status: DC

## 2011-01-21 NOTE — Telephone Encounter (Signed)
Spoke with pt she states she cannot take oxycontin. She states " I do not like the way it makes me feel". Pt states she has the bottle of medication if you want to see it.

## 2011-01-22 NOTE — Telephone Encounter (Signed)
Rx[s] Done & faxed to pharmacy. Patient informed; will call if she has any questions. OV scheduled during last round of medication 12.03.12 @ 3:00pm.

## 2011-01-23 ENCOUNTER — Ambulatory Visit (INDEPENDENT_AMBULATORY_CARE_PROVIDER_SITE_OTHER): Payer: BC Managed Care – PPO | Admitting: Internal Medicine

## 2011-01-23 ENCOUNTER — Encounter: Payer: Self-pay | Admitting: Internal Medicine

## 2011-01-23 DIAGNOSIS — K529 Noninfective gastroenteritis and colitis, unspecified: Secondary | ICD-10-CM

## 2011-01-23 DIAGNOSIS — K559 Vascular disorder of intestine, unspecified: Secondary | ICD-10-CM

## 2011-01-23 DIAGNOSIS — K5289 Other specified noninfective gastroenteritis and colitis: Secondary | ICD-10-CM

## 2011-01-23 MED ORDER — PEG-KCL-NACL-NASULF-NA ASC-C 100 G PO SOLR: 1.0000 | Freq: Once | ORAL | Status: DC

## 2011-01-23 NOTE — Patient Instructions (Signed)
You have been scheduled for a colonoscopy with propofol. Please follow written instructions given to you at your visit today.  Please pick up your prep kit at the pharmacy within the next 2-3 days. CC: Dr Debby Bud

## 2011-01-23 NOTE — Progress Notes (Signed)
[name] [name] 03-02-1946 MRN [id number]   History of Present Illness:  This is a 65 year old white female who is status post hospitalization for segmental colitis. It was found on a CT scan of the abdomen as well as on flexible sigmoidoscopy by Dr Leone Payor. She had multiple ulcerations and friable mucosa in the sigmoid colon. She had a prior colonoscopy approximately 12 years ago. She has been about 80% improved. She is very constipated but takes lactulose when necessary. She denies rectal bleeding. The etiology of ischemic colitis is not clear. She has been on multiple pain medications including hydrocodone, Seroquel, Abilify and Valium.   Past Medical History  Diagnosis Date  . Bilateral hearing loss   . Otitis media, chronic   . Urinary, incontinence, stress female   . Dyslipidemia   . Allergic rhinitis, seasonal   . Osteoarthritis cervical spine   . Osteoarthritis of hand     bilateral  . Alcohol abuse, in remission   . Depression with anxiety   . Colitis, ischemic 2012  . Sciatica   . Diverticulitis 2012   Past Surgical History  Procedure Date  . D&c x 4   . Btl laproscopic   . Abdominal hysterectomy   . Tonsillectomy   . Excision of tumors at right neck     angle of jaw '68, benign    reports that she has never smoked. She has never used smokeless tobacco. She reports that she does not drink alcohol or use illicit drugs. family history includes Alcohol abuse in her brother, father, and mother; Diabetes in her brother; and Drug abuse in her mother. Allergies  Allergen Reactions  . Codeine   . Oxycodone   . Procaine Hcl         Review of Systems:  The remainder of the 10 point ROS is negative except as outlined in H&P   Physical Exam: General appearance  Well developed, in no distress. Eyes- non icteric. HEENT nontraumatic, normocephalic. Mouth no lesions, tongue papillated, no cheilosis. Neck supple without adenopathy, thyroid not enlarged, no carotid  bruits, no JVD. Lungs Clear to auscultation bilaterally. Cor normal S1, normal S2, regular rhythm, no murmur,  quiet precordium. Abdomen: Soft abdomen with normal active bowel sounds. Mild tenderness diffusely more so in the left lower quadrant and left middle quadrant. No distention. Rectal: Soft Hemoccult negative stool. Extremities no pedal edema. Skin no lesions. Neurological alert and oriented x 3. Psychological normal mood and affect.  Assessment and Plan:  Problem #1 Status post acute segmental colitis; likely ischemic. Patient has been on multiple narcotic medications for pain control. She is due for a colonoscopy. She will increase the fiber content of her diet and willl continue lactulose, 1-3 tablespoons daily. We have discussed the colonoscopy prep and sedation with propofol.   01/23/2011 [name] [name]

## 2011-02-10 ENCOUNTER — Encounter: Payer: BC Managed Care – PPO | Admitting: Internal Medicine

## 2011-02-12 ENCOUNTER — Encounter: Payer: Self-pay | Admitting: Internal Medicine

## 2011-02-12 ENCOUNTER — Ambulatory Visit (AMBULATORY_SURGERY_CENTER): Payer: BC Managed Care – PPO | Admitting: Internal Medicine

## 2011-02-12 DIAGNOSIS — K559 Vascular disorder of intestine, unspecified: Secondary | ICD-10-CM

## 2011-02-12 DIAGNOSIS — D126 Benign neoplasm of colon, unspecified: Secondary | ICD-10-CM

## 2011-02-12 MED ORDER — SODIUM CHLORIDE 0.9 % IV SOLN: 500.0000 mL | INTRAVENOUS | Status: DC

## 2011-02-12 NOTE — Patient Instructions (Signed)
Discharge instructions given with verbal understanding.  Handouts on polyps and diverticulosis given.  Resume previous medications. 

## 2011-02-12 NOTE — Progress Notes (Signed)
Propofol administered by Sheila Camp, CRNA. Maw  Pt tolerated the colonoscopy very well. maw 

## 2011-02-12 NOTE — Op Note (Signed)
[facility] [address] [place], [place]  [address]  COLONOSCOPY PROCEDURE REPORT  PATIENT:  [name] [name], [name] [name]  MR#:  [id number] BIRTHDATE:  07/19/1945, 65 yrs. old  GENDER:  female ENDOSCOPIST:  Hedwig Morton. Juanda Chance, MD REF. BY:  Rosalyn Gess. Norins, M.D. PROCEDURE DATE:  02/12/2011 PROCEDURE:  Colonoscopy with snare polypectomy ASA CLASS:  Class II INDICATIONS:  recent hospit. for ischemic colitis MEDICATIONS:   MAC sedation, administered by CRNA, propofol (Diprivan)  DESCRIPTION OF PROCEDURE:   After the risks and benefits and of the procedure were explained, informed consent was obtained. Digital rectal exam was performed and revealed no rectal masses. The LB [id number]-[id number] endoscope was introduced through the anus and advanced to the cecum, which was identified by both the appendix and ileocecal valve.  The quality of the prep was good, using MoviPrep.  The instrument was then slowly withdrawn as the colon was fully examined. <<PROCEDUREIMAGES>>  FINDINGS:  Moderate diverticulosis was found (see image1, image2, image9, and image8).  A sessile polyp was found. 8 mm flat polyp at 50 cm Polyp was snared without cautery. Retrieval was unsuccessful (see image7 and image6). snare polyp biopsies taken from the polypectomy site  This was otherwise a normal examination of the colon (see image3, image4, image5, and image10). Retroflexed views in the rectum revealed no abnormalities.    The scope was then withdrawn from the patient and the procedure completed.  COMPLICATIONS:  None ENDOSCOPIC IMPRESSION: 1) Moderate diverticulosis 2) Sessile polyp 3) Otherwise normal examination polyp not recovered RECOMMENDATIONS: 1) Await biopsy results 2) High fiber diet.  REPEAT EXAM:  In 5 year(s) for.  ______________________________ Hedwig Morton. Juanda Chance, MD  CC:  n. eSIGNED:   Hedwig Morton. Lana Flaim at 02/12/2011 09:16 AM  Jacalyn Lefevre, [id number]

## 2011-02-12 NOTE — Progress Notes (Signed)
Patient did not experience any of the following events: a burn prior to discharge; a fall within the facility; wrong site/side/patient/procedure/implant event; or a hospital transfer or hospital admission upon discharge from the facility. (G8907) Patient did not have preoperative order for IV antibiotic SSI prophylaxis. (G8918)  

## 2011-02-13 ENCOUNTER — Telehealth: Payer: Self-pay

## 2011-02-13 NOTE — Telephone Encounter (Signed)

## 2011-02-17 ENCOUNTER — Encounter: Payer: Self-pay | Admitting: Internal Medicine

## 2011-02-19 ENCOUNTER — Ambulatory Visit: Payer: BC Managed Care – PPO | Admitting: Internal Medicine

## 2011-02-25 ENCOUNTER — Ambulatory Visit (INDEPENDENT_AMBULATORY_CARE_PROVIDER_SITE_OTHER): Payer: BC Managed Care – PPO | Admitting: Internal Medicine

## 2011-02-25 ENCOUNTER — Encounter: Payer: Self-pay | Admitting: Internal Medicine

## 2011-02-25 DIAGNOSIS — M25559 Pain in unspecified hip: Secondary | ICD-10-CM

## 2011-02-25 DIAGNOSIS — M545 Low back pain, unspecified: Secondary | ICD-10-CM

## 2011-02-25 DIAGNOSIS — K55059 Acute (reversible) ischemia of intestine, part and extent unspecified: Secondary | ICD-10-CM

## 2011-02-25 DIAGNOSIS — M25551 Pain in right hip: Secondary | ICD-10-CM

## 2011-02-25 DIAGNOSIS — K55039 Acute (reversible) ischemia of large intestine, extent unspecified: Secondary | ICD-10-CM

## 2011-02-25 DIAGNOSIS — Z23 Encounter for immunization: Secondary | ICD-10-CM

## 2011-02-25 MED ORDER — HYDROCODONE-ACETAMINOPHEN 10-325 MG PO TABS: 1.0000 | ORAL_TABLET | Freq: Three times a day (TID) | ORAL | Status: AC | PRN

## 2011-02-25 NOTE — Progress Notes (Signed)
  Subjective:    Patient ID: [name] [name], female    DOB: [birth year], 65 y.o.   MRN: [id number]  HPI [name] [name] presents for follow up. She was recently hospitalized for acute ischemic colitis and made a good recovery. She has subsequently had a colonoscopy - diverticulosis, one sessile polyp - tubular adenoma on path report.   She continues to have back pain on the right with radiation to the leg. She had an MRI lumbar spine in October '12 - with no evidence of nerve root compression. She has had PT and reports that the massage therapy helped but she did not learn any maintenance exercise. She has been taking norco which she was taking two at a time.  return office visit   Review of Systems System review is negative for any constitutional, cardiac, pulmonary, GI or neuro symptoms or complaints other than as described in the HPI.     Objective:   Physical Exam Vitals stable. Gen'l - WNWD white woman in no distress :PUlm - no increased WOB Cor- RRR Neuro - A&O x 3, normal gait, normal strength.       Assessment & Plan:

## 2011-02-25 NOTE — Patient Instructions (Signed)
Ischemic colitis resolved!. Colonoscopy with diverticulosis - an anatomic finding not a disease. One polyp - tubular adenoma.  Sciatica right - MRI October '12 with no evidence of nerve root compression or any surgical finding. Plan: 1 renewal of referral to [facility], 2 referral to Dr. Miguel Dibble - pain management specialist. Short term renewal on hydrocodone. If this is continued long term we will need to execute a controlled substance contract.

## 2011-02-25 NOTE — Assessment & Plan Note (Signed)
Resolved and doing well.

## 2011-02-25 NOTE — Assessment & Plan Note (Signed)
Continued pain. She is intolerant of oxycontin - feels bad, drowsy, nauseated. Has been taking hydrocodone 10mg  tid with relief  Plan - renew referral to PT           Refer to pain clinic           Renew hydrocodone 10 mg tid # 90 with one refill.

## 2011-03-12 ENCOUNTER — Telehealth: Payer: Self-pay | Admitting: Internal Medicine

## 2011-03-12 NOTE — Telephone Encounter (Signed)
Can't do it. Monday is ok

## 2011-03-12 NOTE — Telephone Encounter (Signed)
The pt called and is requesting a same day appt with you because she has concerns with the pain meds she is on.  She states she is now addicted to the med.  Please advise on if you want her to come in today  Thanks!

## 2011-03-16 ENCOUNTER — Other Ambulatory Visit (INDEPENDENT_AMBULATORY_CARE_PROVIDER_SITE_OTHER): Payer: BC Managed Care – PPO

## 2011-03-16 ENCOUNTER — Ambulatory Visit (INDEPENDENT_AMBULATORY_CARE_PROVIDER_SITE_OTHER): Payer: BC Managed Care – PPO | Admitting: Internal Medicine

## 2011-03-16 DIAGNOSIS — M545 Low back pain, unspecified: Secondary | ICD-10-CM

## 2011-03-16 DIAGNOSIS — R300 Dysuria: Secondary | ICD-10-CM

## 2011-03-16 LAB — URINALYSIS, ROUTINE W REFLEX MICROSCOPIC
Bilirubin Urine: NEGATIVE
Hgb urine dipstick: NEGATIVE
Ketones, ur: NEGATIVE
Leukocytes, UA: NEGATIVE
Nitrite: NEGATIVE
Specific Gravity, Urine: 1.015 (ref 1.000–1.030)
Total Protein, Urine: NEGATIVE
Urine Glucose: NEGATIVE
Urobilinogen, UA: 0.2 (ref 0.0–1.0)
pH: 6.5 (ref 5.0–8.0)

## 2011-03-16 MED ORDER — CLONIDINE HCL 0.1 MG PO TABS: ORAL_TABLET | ORAL | Status: DC

## 2011-03-16 NOTE — Patient Instructions (Signed)
Hydrocodone taper: starting at 10 mg 4 times a day; go to 3 times a day for 5 days; then 2 times a day for 5-7 days; then go to 1 per day for 7 days then off. When you start taking hydrocodone just twice a day add clonidine 0.1 mg 4 times a day for 1 day, then 3 times a day x 3 days, then 2 times a day for 3 days and then daily for 3 days. This should help minimize the side effects of withdrawal. Keep you appointment with the pain center.   For U/A today.  For tremor - three of your medications can cause tremor: abilify, seroquel and lithium. You need to discuss tremor with your prescribing psychiatrist.

## 2011-03-16 NOTE — Assessment & Plan Note (Signed)
Has chronic pain but would like to come off hydrocodone. She is concerned about habituation and is likely habituated.  Plan - slow taper - see AVS            Use of clonidine when down to hydrocodone bid.

## 2011-03-16 NOTE — Progress Notes (Signed)
  Subjective:    Patient ID: [name] [name], female    DOB: 07/04/1945, 66 y.o.   MRN: [id number]  HPI [name] [name] is concerned about hydrocodone and addiction. She has tried to stop but gets anxious and nauseated. She has an appointment with a pain center for the 29 th of this month. She feels like she may have a UTI.   She does c/o fine tremor that is continuous and worse when holding things, like a cup of coffee. Reviwed med table: 3 drugs cause tremor: seroquel, abilify and lithium.   I have reviewed the patient's medical history in detail and updated the computerized patient record.    Review of Systems System review is negative for any constitutional, cardiac, pulmonary, GI or neuro symptoms or complaints other than as described in the HPI.     Objective:   Physical Exam Filed Vitals:   03/16/11 0945  BP: 126/78  Pulse: 64  Temp: 97.9 F (36.6 C)   WNWD white woman in no acute distress Pulm - normal respirations Cor - RRR Neuro - alert and oriented, normal gait and station.       Assessment & Plan:

## 2011-04-06 ENCOUNTER — Other Ambulatory Visit: Payer: Self-pay | Admitting: Internal Medicine

## 2011-04-06 NOTE — Telephone Encounter (Signed)
Hydrocodone-APAP request [last refill 12.19.12 #90x1]; pharmacy [[facility]] states that patient filled this Rx on 12.19.12 #90 & again on 01.03.12 #90. Please advise.

## 2011-04-07 ENCOUNTER — Ambulatory Visit: Payer: BC Managed Care – PPO | Admitting: Physical Medicine & Rehabilitation

## 2011-04-07 ENCOUNTER — Encounter: Payer: BC Managed Care – PPO | Attending: Physical Medicine & Rehabilitation

## 2011-04-07 DIAGNOSIS — M51379 Other intervertebral disc degeneration, lumbosacral region without mention of lumbar back pain or lower extremity pain: Secondary | ICD-10-CM | POA: Insufficient documentation

## 2011-04-07 DIAGNOSIS — F319 Bipolar disorder, unspecified: Secondary | ICD-10-CM | POA: Insufficient documentation

## 2011-04-07 DIAGNOSIS — M48061 Spinal stenosis, lumbar region without neurogenic claudication: Secondary | ICD-10-CM

## 2011-04-07 DIAGNOSIS — Z79899 Other long term (current) drug therapy: Secondary | ICD-10-CM | POA: Insufficient documentation

## 2011-04-07 DIAGNOSIS — M79609 Pain in unspecified limb: Secondary | ICD-10-CM | POA: Insufficient documentation

## 2011-04-07 DIAGNOSIS — R209 Unspecified disturbances of skin sensation: Secondary | ICD-10-CM

## 2011-04-07 DIAGNOSIS — M5137 Other intervertebral disc degeneration, lumbosacral region: Secondary | ICD-10-CM | POA: Insufficient documentation
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
## 2011-04-09 ENCOUNTER — Ambulatory Visit (INDEPENDENT_AMBULATORY_CARE_PROVIDER_SITE_OTHER): Payer: BC Managed Care – PPO | Admitting: Internal Medicine

## 2011-04-09 ENCOUNTER — Encounter: Payer: Self-pay | Admitting: Internal Medicine

## 2011-04-09 VITALS — BP 130/78 | HR 87 | Temp 97.7°F | Wt 160.0 lb

## 2011-04-09 DIAGNOSIS — M545 Low back pain, unspecified: Secondary | ICD-10-CM

## 2011-04-09 NOTE — Progress Notes (Signed)
  Subjective:    Patient ID: [name] [name], female    DOB: [birth year], 66 y.o.   MRN: [id number]  HPI Patient inappropriately scheduled for med refill. She is on a taper and needed refill per previous instructions. She is doing well. No charge.  Review of Systems     Objective:   Physical Exam        Assessment & Plan:

## 2011-04-14 ENCOUNTER — Telehealth: Payer: Self-pay | Admitting: Internal Medicine

## 2011-04-14 NOTE — Telephone Encounter (Signed)
THE CLONIDINE IS MAKING PT FEEL SLEEPY AND LOOPY.  CAN'T DRIVE OR FUNCTION.  SLURRED SPEECH.  WHAT CAN SHE DO?  SHOULD SHE TAKE IT TODAY?  SHE TOOK 4 YESTERDAY AND IS TO TAKE 3 TODAY AND NEXT 3 DAYS.  SHE IS STILL ON THE HYDROCODONE.

## 2011-04-14 NOTE — Telephone Encounter (Signed)
Patient informed. 

## 2011-04-14 NOTE — Telephone Encounter (Signed)
Should be tapering off the hydrocodone. May take clonidine twice a day.

## 2011-04-20 ENCOUNTER — Telehealth: Payer: Self-pay

## 2011-04-20 NOTE — Telephone Encounter (Signed)
1. Continue to taper off of hydrocodone as you are  2.  Continue ibuprofen up to 800 mg tid      APAP 1000 mg tid on schedule

## 2011-04-20 NOTE — Telephone Encounter (Signed)
Pt called stating she is tampering off of Hydrocodone, received # 40 01/31 and is taking medication once daily now. Pt has # 15 tablet but was told to completely discontinue medication on 02/16. Pt is c/o of increased back and RT leg pain and is requesting MD advisement, please advise.

## 2011-04-21 ENCOUNTER — Ambulatory Visit: Payer: BC Managed Care – PPO | Admitting: Physical Medicine & Rehabilitation

## 2011-04-21 ENCOUNTER — Encounter: Payer: BC Managed Care – PPO | Attending: Physical Medicine & Rehabilitation

## 2011-04-21 DIAGNOSIS — M48061 Spinal stenosis, lumbar region without neurogenic claudication: Secondary | ICD-10-CM | POA: Insufficient documentation

## 2011-04-21 DIAGNOSIS — M79609 Pain in unspecified limb: Secondary | ICD-10-CM | POA: Insufficient documentation

## 2011-04-21 DIAGNOSIS — M51379 Other intervertebral disc degeneration, lumbosacral region without mention of lumbar back pain or lower extremity pain: Secondary | ICD-10-CM | POA: Insufficient documentation

## 2011-04-21 DIAGNOSIS — F319 Bipolar disorder, unspecified: Secondary | ICD-10-CM | POA: Insufficient documentation

## 2011-04-21 DIAGNOSIS — R209 Unspecified disturbances of skin sensation: Secondary | ICD-10-CM

## 2011-04-21 DIAGNOSIS — M5137 Other intervertebral disc degeneration, lumbosacral region: Secondary | ICD-10-CM | POA: Insufficient documentation

## 2011-04-21 DIAGNOSIS — Z79899 Other long term (current) drug therapy: Secondary | ICD-10-CM | POA: Insufficient documentation

## 2011-04-23 NOTE — Telephone Encounter (Signed)
Patient informed. 

## 2011-04-28 ENCOUNTER — Telehealth: Payer: Self-pay | Admitting: *Deleted

## 2011-04-28 NOTE — Telephone Encounter (Signed)
EMG done 04/21/11 was normal. Injections and PT ordered at visit. Call with any further questions.

## 2011-04-30 ENCOUNTER — Ambulatory Visit: Payer: BC Managed Care – PPO | Attending: Physical Medicine & Rehabilitation | Admitting: Physical Therapy

## 2011-04-30 DIAGNOSIS — M545 Low back pain, unspecified: Secondary | ICD-10-CM | POA: Insufficient documentation

## 2011-04-30 DIAGNOSIS — M25559 Pain in unspecified hip: Secondary | ICD-10-CM | POA: Insufficient documentation

## 2011-04-30 DIAGNOSIS — IMO0001 Reserved for inherently not codable concepts without codable children: Secondary | ICD-10-CM | POA: Insufficient documentation

## 2011-05-04 ENCOUNTER — Ambulatory Visit: Payer: BC Managed Care – PPO | Admitting: Physical Therapy

## 2011-05-05 ENCOUNTER — Telehealth: Payer: Self-pay | Admitting: Physical Medicine & Rehabilitation

## 2011-05-05 NOTE — Telephone Encounter (Signed)
Tramadol not on patient's med list. Called her and left voice message requesting more info on which med she increased from tid to qid, and whether this provided relief.

## 2011-05-05 NOTE — Telephone Encounter (Signed)
Patient is returning call. [phone]

## 2011-05-05 NOTE — Telephone Encounter (Signed)
Patient called [name] [name] stating Ultram TID not helping, told her to increase to QID and call in for further instructions.

## 2011-05-05 NOTE — Telephone Encounter (Signed)
Tramadol was rx'd on 2/12 at visit (not entered in EMR). She had an EMG that day. Patient says not providing any relief. She has had her PT evaluation. Next appt is 05/19/11 here. Any suggestions for her?

## 2011-05-06 NOTE — Telephone Encounter (Signed)
Notified [name] [name] that Dr Wynn Banker suggested taking 2-regular strength Tylenol with each tramadol dose, 3-4x per day. Holding off on hydrocodone as per discussion w/ her at visit. Pt verbalized understanding and agreement w/ plan

## 2011-05-06 NOTE — Telephone Encounter (Signed)
Add tylenol 650mg  with each tramadol dose

## 2011-05-07 ENCOUNTER — Encounter: Payer: BC Managed Care – PPO | Admitting: Physical Therapy

## 2011-05-11 ENCOUNTER — Telehealth: Payer: Self-pay | Admitting: *Deleted

## 2011-05-11 NOTE — Telephone Encounter (Signed)
Pt calling stating she has been taking tramadol rx'd by pain clinic. This is not helping and she states she told ? Dr. Dortha Schwalbe ? this and they don't want her to change/stop med. She is requesting to go back to hydrocodone. Please advise.

## 2011-05-11 NOTE — Telephone Encounter (Signed)
I do not recommend restarting hydorcodone. She needs to continue to work with Dr. Larna Daughters and the pain clinic. Hang in there, it will get better.

## 2011-05-12 ENCOUNTER — Encounter: Payer: BC Managed Care – PPO | Admitting: Physical Therapy

## 2011-05-12 NOTE — Telephone Encounter (Signed)
Patient notified per MD.

## 2011-05-14 ENCOUNTER — Encounter: Payer: BC Managed Care – PPO | Admitting: Physical Therapy

## 2011-05-18 ENCOUNTER — Encounter: Payer: BC Managed Care – PPO | Admitting: Physical Therapy

## 2011-05-19 ENCOUNTER — Ambulatory Visit (HOSPITAL_BASED_OUTPATIENT_CLINIC_OR_DEPARTMENT_OTHER): Payer: BC Managed Care – PPO | Admitting: Physical Medicine & Rehabilitation

## 2011-05-19 ENCOUNTER — Encounter: Payer: Self-pay | Admitting: Physical Medicine & Rehabilitation

## 2011-05-19 ENCOUNTER — Encounter: Payer: BC Managed Care – PPO | Attending: Physical Medicine & Rehabilitation

## 2011-05-19 DIAGNOSIS — Z79899 Other long term (current) drug therapy: Secondary | ICD-10-CM | POA: Insufficient documentation

## 2011-05-19 DIAGNOSIS — M48061 Spinal stenosis, lumbar region without neurogenic claudication: Secondary | ICD-10-CM | POA: Insufficient documentation

## 2011-05-19 DIAGNOSIS — IMO0001 Reserved for inherently not codable concepts without codable children: Secondary | ICD-10-CM

## 2011-05-19 DIAGNOSIS — M76899 Other specified enthesopathies of unspecified lower limb, excluding foot: Secondary | ICD-10-CM

## 2011-05-19 DIAGNOSIS — F319 Bipolar disorder, unspecified: Secondary | ICD-10-CM | POA: Insufficient documentation

## 2011-05-19 DIAGNOSIS — M5137 Other intervertebral disc degeneration, lumbosacral region: Secondary | ICD-10-CM | POA: Insufficient documentation

## 2011-05-19 DIAGNOSIS — M79609 Pain in unspecified limb: Secondary | ICD-10-CM | POA: Insufficient documentation

## 2011-05-19 DIAGNOSIS — M51379 Other intervertebral disc degeneration, lumbosacral region without mention of lumbar back pain or lower extremity pain: Secondary | ICD-10-CM | POA: Insufficient documentation

## 2011-05-19 DIAGNOSIS — M7061 Trochanteric bursitis, right hip: Secondary | ICD-10-CM

## 2011-05-19 MED ORDER — BUPRENORPHINE 5 MCG/HR TD PTWK: MEDICATED_PATCH | TRANSDERMAL | Status: DC

## 2011-05-19 NOTE — Patient Instructions (Signed)
Buprenorphine transdermal patch What is this medicine? BUPRENORPHINE (byoo pre NOR feen) is a pain reliever. It is used to treat moderate to severe pain. This medicine may be used for other purposes; ask your health care provider or pharmacist if you have questions. What should I tell my health care provider before I take this medicine? They need to know if you have any of these conditions: -brain tumor -drink more than 3 alcohol-containing drinks per day -drug abuse or addiction -head injury -lung disease such as asthma or COPD -taken isocarboxazid, phenelzine, tranylcypromine, or selegiline in the past 2 weeks -an unusual or allergic reaction to buprenorphine, other medicines, foods, dyes, or preservatives -pregnant or trying to get pregnant -breast-feeding How should I use this medicine? Apply the patch to your skin. Do not cut or damage the patch. A cut or damaged patch can be very dangerous because you may get too much medicine. Select a clean, dry area of skin on your upper outer arm, upper chest, upper back, or the side of the chest. Do not apply the patch to broken, burned, cut, or irritated skin. Use only water to clean the area. Do not use soap or alcohol to clean the skin because this can increase the effects of the medicine. If the area is hairy, clip the hair with scissors, but do not shave. Take the patch out of its wrapper. Bend the patch along the faint line and slowly peel the outer portion of the liner, which covers the sticky surface of the patch. Press the patch onto the skin and slowly peel off the protective liner. Do not use a patch if the packaging or backing is damaged. Do not touch the sticky part with your fingers. Press the patch to the skin using the palm of your hand. Press the patch to the skin for 15 seconds. Wash your hands at once. Take off the old patch before putting on a new patch. Apply each new patch to a different area of skin. If a patch comes off or causes  irritation, remove it and apply a new patch to a different site. To get rid of used patches, fold the patch in half with the sticky sides together. Then, flush it down the toilet. Alternately, you may dispose of the patch in the Patch-Disposal Unit provided. Never throw the patch away in the trash without sealing it in the Patch-Disposal unit. Pets and children can be harmed if they find used or lost patches. Replace the patch every 7 days. Follow the directions on the prescription label. Do not use more medicine than you are told to use. A special MedGuide will be given to you by the pharmacist with each prescription and refill. Be sure to read this information carefully each time. Talk to your pediatrician regarding the use of this medicine in children. Special care may be needed. If a patch accidentally touches the skin, use only water to clean the area. Do not use soap or alcohol to clean the skin because this can increase the effects of the medicine. If someone accidentally uses a buprenorphine patch and is not awake and alert, immediately call 911 for help. If the person is awake and alert, call a doctor, health care professional, or the [employer]. Overdosage: If you think you've taken too much of this medicine contact a poison control center or emergency room at once. Overdosage: If you think you have taken too much of this medicine contact a poison control center or emergency  room at once. NOTE: This medicine is only for you. Do not share this medicine with others. What if I miss a dose? If you forget to replace your patch, take off the old patch and put on a new patch as soon as you can. Do not apply an extra patch to your skin. Do not wear more than one patch at the same time unless told to do so by your doctor or health care professional. What may interact with this medicine? Do not take this medicine with any of the following medications: -butorphanol -nalbuphine -pentazocineThis  medicine may also interact with the following medications: -alcohol -antibiotics like erythromycin and clarithromycin -antihistamines for allergy, cough and cold -barbiturates like phenobarbital -carbamazepine -certain medicines for irregular heart beat -general anesthetics -MAOIs like Carbex, Eldepryl, Marplan, Nardil, and Parnate -medicines for depression, anxiety, or psychotic disturbances -medicines for fungal infections like ketoconazole and itraconazole -medicines for sleep -medicines used to treat HIV infection or AIDS like ritonavir, saquinavir, and indinavir -other medicines for pain -phenothiazines like chlorpromazine, mesoridazine, prochlorperazine, thioridazine -phenytoin -rifampin or rifampicin -tramadol This list may not describe all possible interactions. Give your health care provider a list of all the medicines, herbs, non-prescription drugs, or dietary supplements you use. Also tell them if you smoke, drink alcohol, or use illegal drugs. Some items may interact with your medicine. What should I watch for while using this medicine? Other pain medicine may be needed when you first start using the patch because the patch can take some time to start working. Tell your doctor or health care professional if your pain does not go away, if it gets worse, or if you have new or a different type of pain. You may develop tolerance to the medicine. Tolerance means that you will need a higher dose of the medicine for pain relief. Tolerance is normal and is expected if you take the medicine for a long time. Do not suddenly stop taking your medicine because you may develop a severe reaction. Your body becomes used to the medicine. This does NOT mean you are addicted. Addiction is a behavior related to getting and using a drug for a non-medical reason. If you have pain, you have a medical reason to take pain medicine. Your doctor will tell you how much medicine to take. If your doctor wants you  to stop the medicine, the dose will be slowly lowered over time to avoid any side effects. You may get drowsy or dizzy. Do not drive, use machinery, or do anything that needs mental alertness until you know how this medicine affects you. Do not stand or sit up quickly, especially if you are an older patient. This reduces the risk of dizzy or fainting spells. Alcohol may interfere with the effect of this medicine. Avoid alcoholic drinks. The medicine will cause constipation. Try to have a bowel movement at least every 2 to 3 days. If you do not have a bowel movement for 3 days, call your doctor or health care professional. Your mouth may get dry. Chewing sugarless gum or sucking hard candy, and drinking plenty of water may help. Contact your doctor if the problem does not go away or is severe. Heat can increase the amount of medicine released from the patch. Do not get the patch hot by using heating pads, heated water beds, electric blankets, and heat lamps. You can bathe or swim while using the patch. But, do not use a sauna or hot tub. Tell you doctor or health care  professional if you get a fever. What side effects may I notice from receiving this medicine? Side effects that you should report to your doctor or health care professional as soon as possible: -allergic reactions like skin rash, itching or hives, swelling of the face, lips, or tongue -anxiety, irritability, nervousness or restlessness -breathing problems -cold, clammy skin or sweating -confusion -diarrhea -feeling faint or lightheaded, falls -stomach pain or vomiting -swelling of ankles -trouble passing urine or change in the amount of urine -yellowing of the eyes or skin  Side effects that usually do not require medical attention (Report these to your doctor or health care professional if they continue or are bothersome.): -constipation -difficulty sleeping -dry mouth -headache -itching, redness, or rash at the patch  site -nausea -vomiting This list may not describe all possible side effects. Call your doctor for medical advice about side effects. You may report side effects to FDA at 1-800-FDA-1088. Where should I keep my medicine? Keep out of the reach of children. This medicine can be abused. Keep your medicine in a safe place to protect it from theft. Do not share this medicine with anyone. Selling or giving away this medicine is dangerous and against the law. Store at room temperature between 59 and 86 degrees F (15 and 30 degrees C). Do not store the patches out of their wrappers. Flush any unused medicines down the toilet as instructed above. Do not use the medicine after the expiration date. NOTE: This sheet is a summary. It may not cover all possible information. If you have questions about this medicine, talk to your doctor, pharmacist, or health care provider.  2012, [employer]vier/[employer]. (09/22/2010 10:13:27 AM)

## 2011-05-19 NOTE — Progress Notes (Deleted)
  Subjective:    Patient ID: [name] [name], female    DOB: 1945-06-15, 66 y.o.   MRN: [id number]  HPI    Review of Systems     Objective:   Physical Exam        Assessment & Plan:

## 2011-05-19 NOTE — Progress Notes (Addendum)
Subjective:    Patient ID: [name] [name], female    DOB: 06/23/1945, 66 y.o.   MRN: [id number]  HPI Pain Inventory Average Pain 6 Pain Right Now 4 My pain is sharp, stabbing and aching  In the last 24 hours, has pain interfered with the following? General activity 5 Relation with others 8 Enjoyment of life 8 What TIME of day is your pain at its worst? Daytime and Evening Sleep (in general) Poor  Pain is worse with: walking, sitting, standing and some activites Pain improves with: rest, pacing activities and medication Relief from Meds: 2  Mobility walk without assistance how many minutes can you walk? 10 ability to climb steps?  yes do you drive?  yes  Function retired I need assistance with the following:  meal prep, household duties and shopping  Neuro/Psych trouble walking spasms confusion depression anxiety  Prior Studies Any changes since last visit?  no *RADIOLOGY REPORT*  Clinical Data: Low back pain and bilateral hip pain.  BILATERAL HIP WITH PELVIS - 4+ VIEW 12/08/2010:  Comparison: None.  Findings: No evidence of acute, subacute, or healed fractures.  Joint spaces in both hips well preserved. Bone mineral density  well preserved. No intrinsic osseous abnormalities.  Included AP pelvis demonstrates intact sacroiliac joints and  symphysis pubis. No intrinsic osseous abnormalities.  IMPRESSION:  Normal examination.  Physicians involved in your care Primary care Dr. Debby Bud Psychiatrist Dr. Pershing Proud Psychologist Bradley Ferris  Review of Systems  Constitutional: Negative.   HENT: Negative.   Eyes: Negative.   Respiratory: Negative.   Cardiovascular: Negative.   Gastrointestinal: Positive for constipation.  Genitourinary: Negative.   Musculoskeletal: Negative.   Skin: Negative.   Neurological: Negative.   Hematological: Negative.   Psychiatric/Behavioral: Positive for confusion. The patient is nervous/anxious.        Objective:   Physical Exam   Constitutional: She is oriented to person, place, and time. She appears well-developed.  HENT:  Head: Normocephalic and atraumatic.  Neck: Normal range of motion.  Neurological: She is alert and oriented to person, place, and time.  Psychiatric: She has a normal mood and affect. Her behavior is normal. Judgment and thought content normal.    Fibromyalgia points +10/18 Tenderness over the right trochanteric bursa. Lumbar spine range of motion reduced in extension only. Upper extremity and lower extremity strength is normal       Assessment & Plan:  1. Fibromyalgia syndrome. She has this as her main pain diagnosis. Her lumbar stenosis is mild and certainly not causing any signs of neural compression. Her right lower extremity pain does not appear to be neurogenic in nature. She has multiple fibromyalgia tender points positive. Does not tolerate several of the fibromyalgia medications such as Neurontin. She does not want to take Lyrica because her psychiatrist told her not to. She does not want to switch from Lexapro to Cymbalta. I discussed that I do not prescribe oxycodone or other strong narcotics for pharmacologists syndrome. I received a written note from her psychiatrist stating that B. per nor feeding has been used successfully in patients with bipolar disorder and pain. Certainly I can write a Butrans patch however I am not licensed to prescribe buprenorphine tablets or injectable.  2. Right trochanteric bursitis. I believe she will benefit from an injection. I believe this is what's causing some of her radiating pain. She may require some physical therapy to do some ITB stretching  Aspiration right trochanteric bursitis/Injection Procedure Note [name] [name] [id number] 10-Oct-1945  Procedure: Injection  Indications: troch bursitis  Procedure Details Consent: Risks of procedure as well as the alternatives and risks of each were explained to the (patient/caregiver).  Consent for  procedure obtained. Time Out: Verified patient identification, verified procedure, site/side was marked, verified correct patient position, special equipment/implants available, medications/allergies/relevent history reviewed, required imaging and test results available.  Performed   Local Anesthesia Used:Lidocaine 1% plain; 4mL  1 cc of 40 mg per cc Depo-Medrol      Amount of Fluid Aspirated: minimal amount Character of Flu noneid: Not applicable Fluid was sent for:Not applicable A sterile dressing was applied.  Patient did tolerate procedure well. Estimated blood loss: none  KIRSTEINS,ANDREW E 05/19/2011, 5:34 PM

## 2011-05-27 ENCOUNTER — Telehealth: Payer: Self-pay | Admitting: Physical Medicine & Rehabilitation

## 2011-05-27 NOTE — Telephone Encounter (Signed)
Dr gave her 1 pain patch for 7 days to see how it worked.  Worked well up until the 6th day.  She does not have anymore.  Can Dr call in some more?

## 2011-05-28 ENCOUNTER — Encounter: Payer: Self-pay | Admitting: Physical Medicine & Rehabilitation

## 2011-05-28 NOTE — Telephone Encounter (Signed)
May increase to patch change qweek #4 patches and no refill

## 2011-05-29 MED ORDER — BUPRENORPHINE 10 MCG/HR TD PTWK: 1.0000 | MEDICATED_PATCH | TRANSDERMAL | Status: DC

## 2011-05-29 NOTE — Telephone Encounter (Signed)
Pt aware rx has been called in. I'm not certain that this can be called in so I let her know that there is a chance that she might have to come here and pick up the original rx.

## 2011-05-29 NOTE — Telephone Encounter (Signed)
Addended by: Caryl Ada on: 05/29/2011 08:43 AM   Modules accepted: Orders

## 2011-06-10 ENCOUNTER — Encounter: Payer: Self-pay | Admitting: Physical Medicine & Rehabilitation

## 2011-06-19 ENCOUNTER — Telehealth: Payer: Self-pay | Admitting: Physical Medicine & Rehabilitation

## 2011-06-19 ENCOUNTER — Ambulatory Visit: Payer: BC Managed Care – PPO | Admitting: Physical Medicine & Rehabilitation

## 2011-06-19 ENCOUNTER — Encounter: Payer: BC Managed Care – PPO | Attending: Physical Medicine & Rehabilitation

## 2011-06-19 DIAGNOSIS — Z79899 Other long term (current) drug therapy: Secondary | ICD-10-CM | POA: Insufficient documentation

## 2011-06-19 DIAGNOSIS — M5137 Other intervertebral disc degeneration, lumbosacral region: Secondary | ICD-10-CM | POA: Insufficient documentation

## 2011-06-19 DIAGNOSIS — M79609 Pain in unspecified limb: Secondary | ICD-10-CM | POA: Insufficient documentation

## 2011-06-19 DIAGNOSIS — M51379 Other intervertebral disc degeneration, lumbosacral region without mention of lumbar back pain or lower extremity pain: Secondary | ICD-10-CM | POA: Insufficient documentation

## 2011-06-19 DIAGNOSIS — F319 Bipolar disorder, unspecified: Secondary | ICD-10-CM | POA: Insufficient documentation

## 2011-06-19 DIAGNOSIS — M48061 Spinal stenosis, lumbar region without neurogenic claudication: Secondary | ICD-10-CM | POA: Insufficient documentation

## 2011-06-19 MED ORDER — BUPRENORPHINE 10 MCG/HR TD PTWK: 1.0000 | MEDICATED_PATCH | TRANSDERMAL | Status: DC

## 2011-06-19 NOTE — Telephone Encounter (Signed)
Rx called in, pt aware 

## 2011-06-19 NOTE — Telephone Encounter (Signed)
Put on last pain patch today ( does not know the name of it).

## 2011-07-02 ENCOUNTER — Telehealth: Payer: Self-pay | Admitting: Physical Medicine & Rehabilitation

## 2011-07-02 MED ORDER — BUPRENORPHINE 20 MCG/HR TD PTWK: 1.0000 | MEDICATED_PATCH | TRANSDERMAL | Status: DC

## 2011-07-02 NOTE — Telephone Encounter (Signed)
May increase Butrans patch to 20 mcg change weekly #4 no refills

## 2011-07-02 NOTE — Telephone Encounter (Signed)
Pt aware of increase in her patch. New dose has been called into her pharmacy.

## 2011-07-02 NOTE — Telephone Encounter (Signed)
The last time she called about this you increased her Butrans patch to . Please advise.

## 2011-07-02 NOTE — Telephone Encounter (Signed)
Patient is experiencing more pain.

## 2011-07-03 ENCOUNTER — Telehealth: Payer: Self-pay | Admitting: Physical Medicine & Rehabilitation

## 2011-07-03 NOTE — Telephone Encounter (Signed)
Pharmacy out of patches, has been using 2 .  Still in much pain.  Please call.

## 2011-07-03 NOTE — Telephone Encounter (Signed)
Reduce Butrans to 10 mg per day for one week and then discontinue  call in Flexeril 5 mg 3 times per day #90  1 refill   Pain is due to fibromyalgia. It left a message on the patient's cell phone as well as home phone to call me back at the clinic.. Limited options do to allergies and psychiatric comorbidities.

## 2011-07-06 ENCOUNTER — Telehealth: Payer: Self-pay

## 2011-07-06 NOTE — Telephone Encounter (Signed)
Lm advising pt in butrans reduction and medication called in.

## 2011-07-07 ENCOUNTER — Telehealth: Payer: Self-pay | Admitting: Physical Medicine & Rehabilitation

## 2011-07-07 NOTE — Telephone Encounter (Signed)
Dr wanted her to wear and take Flexeril.  Psyche MD said she could not take Flexeril.  Still has on patch.  Still sleepy, but she is not in any pain.  Please advise.

## 2011-07-08 NOTE — Telephone Encounter (Signed)
Please advise 

## 2011-07-09 MED ORDER — METHOCARBAMOL 500 MG PO TABS: 500.0000 mg | ORAL_TABLET | Freq: Two times a day (BID) | ORAL | Status: AC

## 2011-07-09 NOTE — Telephone Encounter (Signed)
Lm informing pt of new medication.  Med sent into pharmacy.

## 2011-07-09 NOTE — Telephone Encounter (Signed)
D/C flexeril May call in robaxin 500mg  po BID prn #60 no refill

## 2011-07-10 ENCOUNTER — Encounter: Payer: Self-pay | Admitting: Physical Medicine & Rehabilitation

## 2011-07-10 ENCOUNTER — Ambulatory Visit (HOSPITAL_BASED_OUTPATIENT_CLINIC_OR_DEPARTMENT_OTHER): Payer: BC Managed Care – PPO | Admitting: Physical Medicine & Rehabilitation

## 2011-07-10 ENCOUNTER — Encounter: Payer: BC Managed Care – PPO | Attending: Physical Medicine & Rehabilitation

## 2011-07-10 DIAGNOSIS — M79609 Pain in unspecified limb: Secondary | ICD-10-CM | POA: Insufficient documentation

## 2011-07-10 DIAGNOSIS — F319 Bipolar disorder, unspecified: Secondary | ICD-10-CM | POA: Insufficient documentation

## 2011-07-10 DIAGNOSIS — IMO0001 Reserved for inherently not codable concepts without codable children: Secondary | ICD-10-CM

## 2011-07-10 DIAGNOSIS — M48061 Spinal stenosis, lumbar region without neurogenic claudication: Secondary | ICD-10-CM | POA: Insufficient documentation

## 2011-07-10 DIAGNOSIS — M797 Fibromyalgia: Secondary | ICD-10-CM

## 2011-07-10 DIAGNOSIS — M51379 Other intervertebral disc degeneration, lumbosacral region without mention of lumbar back pain or lower extremity pain: Secondary | ICD-10-CM | POA: Insufficient documentation

## 2011-07-10 DIAGNOSIS — M5137 Other intervertebral disc degeneration, lumbosacral region: Secondary | ICD-10-CM | POA: Insufficient documentation

## 2011-07-10 DIAGNOSIS — Z79899 Other long term (current) drug therapy: Secondary | ICD-10-CM | POA: Insufficient documentation

## 2011-07-10 MED ORDER — BUPRENORPHINE 10 MCG/HR TD PTWK: 10.0000 ug | MEDICATED_PATCH | TRANSDERMAL | Status: DC

## 2011-07-10 NOTE — Progress Notes (Signed)
  Subjective:    Patient ID: [name] [name], female    DOB: [birth year], 66 y.o.   MRN: [id number]  HPI Patient doing overall better on Butrans patch. Had a flareup on the 10 mcg and we tried to 20 mcg however this made her too drowsy. We also called in some Flexeril but her psychiatrist states that this was not a good combination with her other medications. We called in some Robaxin but she has not started this yet. We discussed how this may cause drowsiness and not to take it right before she drives until she knows how it affects her. Pain Inventory Average Pain 5 Pain Right Now 0 My pain is sharp  In the last 24 hours, has pain interfered with the following? General activity 0 Relation with others 0 Enjoyment of life 0 What TIME of day is your pain at its worst? not hurting right now Sleep (in general) Fair  Pain is worse with: walking, sitting, inactivity and some activites Pain improves with: rest, heat/ice, pacing activities and medication Relief from Meds: 8  Mobility walk without assistance ability to climb steps?  yes  Function retired  Neuro/Psych spasms confusion depression anxiety  Prior Studies Any changes since last visit?  no  Physicians involved in your care Any changes since last visit?  no        Review of Systems  Constitutional: Negative.   HENT: Negative.   Eyes: Negative.   Respiratory: Negative.   Cardiovascular: Negative.   Gastrointestinal: Positive for constipation.  Genitourinary: Negative.   Musculoskeletal: Negative.   Skin: Negative.   Neurological: Negative.   Hematological: Negative.   Psychiatric/Behavioral: Negative.        Objective:   Physical Exam  Constitutional: She is oriented to person, place, and time. She appears well-developed and well-nourished.  HENT:  Head: Normocephalic and atraumatic.  Eyes: Conjunctivae are normal. Pupils are equal, round, and reactive to light.  Musculoskeletal:       Right  shoulder: Normal.       Left shoulder: Normal.       Right hip: Normal.       Left hip: Normal.       Cervical back: Normal.  Neurological: She is alert and oriented to person, place, and time. She has normal strength.  Psychiatric: She has a normal mood and affect.          Assessment & Plan:  1. Fibromyalgia syndrome. She is doing quite well on the Butrans. She had an episode of pain flareup which has subsided. She is now well enough to start some exercise. We discussed this at length including using the [facility] fibromyalgia pool program or a walking program where she can start at 10 minutes per day and work up to 30 minutes once or twice a day We'll see the patient back in 2 months PA visit.

## 2011-08-07 ENCOUNTER — Other Ambulatory Visit: Payer: Self-pay | Admitting: Physical Medicine & Rehabilitation

## 2011-08-11 ENCOUNTER — Other Ambulatory Visit: Payer: Self-pay | Admitting: Physical Medicine & Rehabilitation

## 2011-08-14 ENCOUNTER — Telehealth: Payer: Self-pay | Admitting: *Deleted

## 2011-08-14 NOTE — Telephone Encounter (Signed)
A rx was called in for Butrans on 08/11/11. She has already picked up that rx. Is it okay to put her back on ? If so she is willing to bring in the unused patches for [place] to destroy so she can have a new rx.

## 2011-08-14 NOTE — Telephone Encounter (Signed)
LM for pt to call back.

## 2011-08-14 NOTE — Telephone Encounter (Signed)
Has been have a lot pain recently. Butrans patch was increased to and wasn't working so it was decreased back to . is not enough and would to have back. Please call.

## 2011-08-17 NOTE — Telephone Encounter (Signed)
Pt aware.

## 2011-08-17 NOTE — Telephone Encounter (Signed)
Please have pt put 2 x 10 mcg patch on before we call in the again

## 2011-08-20 ENCOUNTER — Emergency Department (HOSPITAL_COMMUNITY)
Admission: EM | Admit: 2011-08-20 | Discharge: 2011-08-20 | Discharge status: Against Medical Advice | Payer: BC Managed Care – PPO

## 2011-08-27 ENCOUNTER — Telehealth: Payer: Self-pay | Admitting: Physical Medicine & Rehabilitation

## 2011-08-27 MED ORDER — BUPRENORPHINE 20 MCG/HR TD PTWK: 20.0000 ug | MEDICATED_PATCH | TRANSDERMAL | Status: DC

## 2011-08-27 NOTE — Telephone Encounter (Signed)
The last time the patient called she wanted an increase in [place]. Per Dr. Wynn Banker the next time she needed a refill we were to increase to . This rx has been called in. Pt aware.

## 2011-08-27 NOTE — Telephone Encounter (Signed)
Needs refill on patches.  Needs today, going out of town tomorrow.  [facility] [phone]

## 2011-08-27 NOTE — Telephone Encounter (Signed)
[name] [name] is aware that is correct.

## 2011-08-27 NOTE — Telephone Encounter (Signed)
Bobby/ [facility] [phone]  Is Butrans correct?

## 2011-08-30 ENCOUNTER — Emergency Department (HOSPITAL_COMMUNITY): Payer: BC Managed Care – PPO

## 2011-08-30 ENCOUNTER — Encounter (HOSPITAL_COMMUNITY): Payer: Self-pay

## 2011-08-30 ENCOUNTER — Emergency Department (HOSPITAL_COMMUNITY)
Admission: EM | Admit: 2011-08-30 | Discharge: 2011-08-30 | Disposition: A | Discharge status: Home / Self Care | Payer: BC Managed Care – PPO | Attending: Emergency Medicine | Admitting: Emergency Medicine

## 2011-08-30 DIAGNOSIS — R0602 Shortness of breath: Secondary | ICD-10-CM | POA: Insufficient documentation

## 2011-08-30 DIAGNOSIS — R509 Fever, unspecified: Secondary | ICD-10-CM | POA: Insufficient documentation

## 2011-08-30 DIAGNOSIS — R109 Unspecified abdominal pain: Secondary | ICD-10-CM | POA: Insufficient documentation

## 2011-08-30 DIAGNOSIS — E785 Hyperlipidemia, unspecified: Secondary | ICD-10-CM | POA: Insufficient documentation

## 2011-08-30 DIAGNOSIS — M47812 Spondylosis without myelopathy or radiculopathy, cervical region: Secondary | ICD-10-CM | POA: Insufficient documentation

## 2011-08-30 DIAGNOSIS — M19049 Primary osteoarthritis, unspecified hand: Secondary | ICD-10-CM | POA: Insufficient documentation

## 2011-08-30 DIAGNOSIS — Z79899 Other long term (current) drug therapy: Secondary | ICD-10-CM | POA: Insufficient documentation

## 2011-08-30 DIAGNOSIS — F411 Generalized anxiety disorder: Secondary | ICD-10-CM | POA: Insufficient documentation

## 2011-08-30 DIAGNOSIS — J329 Chronic sinusitis, unspecified: Secondary | ICD-10-CM | POA: Insufficient documentation

## 2011-08-30 DIAGNOSIS — F329 Major depressive disorder, single episode, unspecified: Secondary | ICD-10-CM | POA: Insufficient documentation

## 2011-08-30 DIAGNOSIS — F3289 Other specified depressive episodes: Secondary | ICD-10-CM | POA: Insufficient documentation

## 2011-08-30 LAB — URINALYSIS, ROUTINE W REFLEX MICROSCOPIC
Bilirubin Urine: NEGATIVE
Glucose, UA: NEGATIVE mg/dL
Ketones, ur: NEGATIVE mg/dL
Nitrite: NEGATIVE
Protein, ur: NEGATIVE mg/dL
Specific Gravity, Urine: 1.017 (ref 1.005–1.030)
Urobilinogen, UA: 0.2 mg/dL (ref 0.0–1.0)
pH: 6 (ref 5.0–8.0)

## 2011-08-30 LAB — CBC
HCT: 39.7 % (ref 36.0–46.0)
Hemoglobin: 13.1 g/dL (ref 12.0–15.0)
MCH: 30.2 pg (ref 26.0–34.0)
MCHC: 33 g/dL (ref 30.0–36.0)
MCV: 91.5 fL (ref 78.0–100.0)
Platelets: 153 10*3/uL (ref 150–400)
RBC: 4.34 MIL/uL (ref 3.87–5.11)
RDW: 13.4 % (ref 11.5–15.5)
WBC: 4.6 10*3/uL (ref 4.0–10.5)

## 2011-08-30 LAB — BASIC METABOLIC PANEL
BUN: 10 mg/dL (ref 6–23)
CO2: 22 mEq/L (ref 19–32)
Calcium: 9 mg/dL (ref 8.4–10.5)
Chloride: 100 mEq/L (ref 96–112)
Creatinine, Ser: 1.15 mg/dL — ABNORMAL HIGH (ref 0.50–1.10)
GFR calc Af Amer: 57 mL/min — ABNORMAL LOW (ref >90)
GFR calc non Af Amer: 50 mL/min — ABNORMAL LOW (ref >90)
Glucose, Bld: 129 mg/dL — ABNORMAL HIGH (ref 70–99)
Potassium: 4 mEq/L (ref 3.5–5.1)
Sodium: 134 mEq/L — ABNORMAL LOW (ref 135–145)

## 2011-08-30 LAB — URINE MICROSCOPIC-ADD ON

## 2011-08-30 MED ORDER — SODIUM CHLORIDE 0.9 % IV BOLUS (SEPSIS)
1000.0000 mL | Freq: Once | INTRAVENOUS | Status: AC
  Administered 2011-08-30: 1000 mL via INTRAVENOUS

## 2011-08-30 MED ORDER — ACETAMINOPHEN 325 MG PO TABS
650.0000 mg | ORAL_TABLET | Freq: Once | ORAL | Status: AC
  Administered 2011-08-30: 650 mg via ORAL
  Filled 2011-08-30: qty 2

## 2011-08-30 MED ORDER — FLUTICASONE PROPIONATE 50 MCG/ACT NA SUSP: 2.0000 | Freq: Every day | NASAL | Status: DC

## 2011-08-30 MED ORDER — AMOXICILLIN-POT CLAVULANATE 875-125 MG PO TABS: 1.0000 | ORAL_TABLET | Freq: Two times a day (BID) | ORAL | Status: AC

## 2011-08-30 NOTE — ED Notes (Signed)
Pt in from home with c/o SOB x1 weeks states nasal congestion at night, states dizziness when standing states feels as if she the flu

## 2011-08-30 NOTE — Discharge Instructions (Signed)

## 2011-08-30 NOTE — ED Provider Notes (Signed)
History     CSN: [id number]  Arrival date & time 08/30/11  1444   First MD Initiated Contact with Patient 08/30/11 1508      Chief Complaint  Patient presents with  . Shortness of Breath    HPI Patient presents with several days of worsening "wheezing", shortness of breath, abdominal pain, fevers, chills, night sweat, and dizziness.  She reports that for the past several weeks she has had difficulty breathing at night due to sinus congestion. She denies post nasal drip, sore throat, or cough. She denies nausea and vomiting.  She reports she has felt constipated and took a laxative today which helped.  She has been eating and drinking.    She is currently being treated with bactrim for a UTI and for a yeast infection under her breasts and in her groin.  She has a history of diverticular disease, but no history of diverticulitis.  She was hospitalized last year with bacterial colitis. Symptoms are mild/moderate. Nothing worsens or improves her symptoms  Past Medical History  Diagnosis Date  . Bilateral hearing loss   . Otitis media, chronic   . Urinary, incontinence, stress female   . Dyslipidemia   . Allergic rhinitis, seasonal   . Osteoarthritis cervical spine   . Osteoarthritis of hand     bilateral  . Alcohol abuse, in remission   . Depression with anxiety   . Colitis, ischemic 2012  . Sciatica   . Diverticulitis 2012  . Alcohol abuse   . Depression     Past Surgical History  Procedure Date  . D&c x 4   . Btl laproscopic   . Abdominal hysterectomy   . Tonsillectomy   . Excision of tumors at right neck     angle of jaw '68, benign  . Hearing loss     BIL HEARING LOSS  . Otitis   . Urinary incontinence   . Dislipidemia     Family History  Problem Relation Age of Onset  . Diabetes Brother   . Alcohol abuse Brother     x 2  . Drug abuse Mother   . Alcohol abuse Mother   . Alcohol abuse Father   . Colon cancer Paternal Uncle     History  Substance Use  Topics  . Smoking status: Never Smoker   . Smokeless tobacco: Never Used  . Alcohol Use: No     alcohol abuse formally; has been abstinent x 3 years    OB History    Grav Para Term Preterm Abortions TAB SAB Ect Mult Living                  Review of Systems  All other systems reviewed and are negative.    Allergies  Codeine; Neurontin; Oxycodone; and Procaine hcl  Home Medications   Current Outpatient Rx  Name Route Sig Dispense Refill  . ARIPIPRAZOLE 2 MG PO TABS Oral Take 2 mg by mouth daily.      Marland Kitchen BUPRENORPHINE 20 MCG/HR TD PTWK Transdermal Place 20 mcg onto the skin once a week. friday    . CLONIDINE HCL 0.1 MG PO TABS Oral Take 0.1 mg by mouth at bedtime.    Marland Kitchen DIAZEPAM 5 MG PO TABS Oral Take 5 mg by mouth every 6 (six) hours as needed.      Marland Kitchen ESCITALOPRAM OXALATE 10 MG PO TABS Oral Take 15 mg by mouth daily. Pt takes 1 and 1/2 tab daily    .  ESTROGENS CONJ SYNTHETIC A 0.625 MG PO TABS Oral Take 1.25 mg by mouth at bedtime. Pt takes 2 tabs at bedtime    . IBUPROFEN 100 MG PO TABS Oral Take 100 mg by mouth every 6 (six) hours as needed.    Marland Kitchen LACTULOSE 10 GM/15ML PO SOLN  Take 30 ml three times a day as needed for constipation. 500 mL 2  . LEVOTHYROXINE SODIUM 75 MCG PO TABS Oral Take 75 mcg by mouth daily.      Marland Kitchen LITHIUM CARBONATE 300 MG PO CAPS Oral Take 600 mg by mouth at bedtime.     Marland Kitchen QUETIAPINE FUMARATE 100 MG PO TABS Oral Take 100 mg by mouth 4 (four) times daily.     Marland Kitchen ROPINIROLE HCL 0.5 MG PO TABS Oral Take 0.5 mg by mouth at bedtime.     . SULFAMETHOXAZOLE-TMP DS 800-160 MG PO TABS Oral Take 1 tablet by mouth 2 (two) times daily. Started on 08-24-11 for 7 day therapy. On day 5 of therapy      BP 136/66  Pulse 104  Temp 99.2 F (37.3 C) (Oral)  Resp 18  SpO2 98%  Physical Exam  Constitutional: She is oriented to person, place, and time. She appears well-developed and well-nourished. She is cooperative.       Flushed, warm to touch  HENT:  Head:  Normocephalic and atraumatic.  Mouth/Throat: Mucous membranes are dry.  Neck: Normal range of motion. No edema present.  Cardiovascular: Regular rhythm and normal heart sounds.  Tachycardia present.   Pulmonary/Chest: Effort normal and breath sounds normal.  Abdominal: Soft. Normal appearance. There is tenderness in the right lower quadrant, suprapubic area and left lower quadrant.  Musculoskeletal: Normal range of motion.  Neurological: She is alert and oriented to person, place, and time.  Skin: Skin is warm.       ED Course  Procedures (including critical care time)  Labs Reviewed  BASIC METABOLIC PANEL - Abnormal; Notable for the following:    Sodium 134 (*)     Glucose, Bld 129 (*)     Creatinine, Ser 1.15 (*)     GFR calc non Af Amer 50 (*)     GFR calc Af Amer 57 (*)     All other components within normal limits  URINALYSIS, ROUTINE W REFLEX MICROSCOPIC - Abnormal; Notable for the following:    Hgb urine dipstick TRACE (*)     Leukocytes, UA SMALL (*)     All other components within normal limits  CBC  URINE MICROSCOPIC-ADD ON   Dg Chest 2 View  08/30/2011  *RADIOLOGY REPORT*  Clinical Data: Shortness of breath.  Fever.  Urinary tract infection.  CHEST - 2 VIEW  Comparison: None.  Findings: Cardiac silhouette normal in size.  Thoracic aorta tortuous.  Hilar and mediastinal contours otherwise unremarkable. Minimal linear atelectasis or scarring in the right middle lobe. Lungs otherwise clear.  Bronchovascular markings normal.  No pleural effusions.  Visualized bony thorax intact.  IMPRESSION: Minimal linear atelectasis or scar in the right middle lobe.  No acute cardiopulmonary disease otherwise.  Original Report Authenticated By: Arnell Sieving, M.D.    I personally reviewed the imaging tests through PACS system  I reviewed available ER/hospitalization records thought the EMR    1. Sinusitis       MDM  The patient is overall well-appearing.  She likely has  low-grade temperature.  She recovered for sinusitis.  Chest x-ray is clear.  Her heart rate  is now 53.  Her oxygen saturation is normal.  Home with nasal spray for what sounds like allergic rhinitis.  Close PCP followup.  A short course of antibiotics for sinusitis in addition to completion of her Bactrim.  Her urine appears to have cleared        Lyanne Co, MD 08/30/11 [phone]

## 2011-09-08 ENCOUNTER — Telehealth: Payer: Self-pay | Admitting: Physical Medicine & Rehabilitation

## 2011-09-08 NOTE — Telephone Encounter (Signed)
Pt is not taking Robaxin she states that she it didn't help. She is wanting to know if she can have Hydrocodone? Please advise.

## 2011-09-08 NOTE — Telephone Encounter (Signed)
Do you have any suggestions? Pt was on Butrans patch and was changed to . Is still have having pain.

## 2011-09-08 NOTE — Telephone Encounter (Signed)
Is she taking her Robaxin regulary, she can take 500mg  tid-qid if needed. She also should start working out in the pool, research has shown, that working out in the water is almost the most beneficial treatment for pain from fibromyalgia.

## 2011-09-08 NOTE — Telephone Encounter (Signed)
Calling about the pain.  Pain even with 20 mcg patches.  What does she do?

## 2011-09-09 ENCOUNTER — Ambulatory Visit: Payer: BC Managed Care – PPO | Admitting: Physical Medicine and Rehabilitation

## 2011-09-09 NOTE — Telephone Encounter (Signed)
Is she doing exercises in the water, research shows most beneficial treatment for fibromyalgia. Can try flexeril or anti inflammatory, will not prescribe/ start narcotic without seeing her.

## 2011-09-09 NOTE — Telephone Encounter (Signed)
Pt aware of Karen's recommendations. She has an appointment coming up soon and will talk to Clydie Braun then.

## 2011-09-15 ENCOUNTER — Encounter: Payer: Self-pay | Admitting: Physical Medicine and Rehabilitation

## 2011-09-15 ENCOUNTER — Encounter
Payer: BC Managed Care – PPO | Attending: Physical Medicine & Rehabilitation | Admitting: Physical Medicine and Rehabilitation

## 2011-09-15 VITALS — BP 130/74 | HR 84 | Resp 14 | Ht 62.0 in | Wt 177.0 lb

## 2011-09-15 DIAGNOSIS — G8929 Other chronic pain: Secondary | ICD-10-CM | POA: Insufficient documentation

## 2011-09-15 DIAGNOSIS — IMO0001 Reserved for inherently not codable concepts without codable children: Secondary | ICD-10-CM

## 2011-09-15 DIAGNOSIS — M545 Low back pain, unspecified: Secondary | ICD-10-CM

## 2011-09-15 DIAGNOSIS — M79609 Pain in unspecified limb: Secondary | ICD-10-CM | POA: Insufficient documentation

## 2011-09-15 DIAGNOSIS — M797 Fibromyalgia: Secondary | ICD-10-CM

## 2011-09-15 MED ORDER — BUPRENORPHINE 20 MCG/HR TD PTWK: 20.0000 ug | MEDICATED_PATCH | TRANSDERMAL | Status: DC

## 2011-09-15 MED ORDER — MELOXICAM 15 MG PO TABS: 15.0000 mg | ORAL_TABLET | Freq: Every day | ORAL | Status: DC

## 2011-09-15 NOTE — Patient Instructions (Signed)
Continue with councelling, start exercises in a pool, preferable a fibromyalgia group,
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
## 2011-09-18 ENCOUNTER — Ambulatory Visit (INDEPENDENT_AMBULATORY_CARE_PROVIDER_SITE_OTHER): Payer: BC Managed Care – PPO | Admitting: Internal Medicine

## 2011-09-18 ENCOUNTER — Encounter: Payer: Self-pay | Admitting: Internal Medicine

## 2011-09-18 ENCOUNTER — Telehealth: Payer: Self-pay | Admitting: Internal Medicine

## 2011-09-18 VITALS — BP 118/72 | HR 105 | Temp 97.5°F

## 2011-09-18 DIAGNOSIS — B009 Herpesviral infection, unspecified: Secondary | ICD-10-CM

## 2011-09-18 DIAGNOSIS — B001 Herpesviral vesicular dermatitis: Secondary | ICD-10-CM

## 2011-09-18 DIAGNOSIS — R300 Dysuria: Secondary | ICD-10-CM

## 2011-09-18 DIAGNOSIS — N390 Urinary tract infection, site not specified: Secondary | ICD-10-CM

## 2011-09-18 LAB — POCT URINALYSIS DIPSTICK
Bilirubin, UA: NEGATIVE
Glucose, UA: NEGATIVE
Ketones, UA: NEGATIVE
Nitrite, UA: NEGATIVE
Protein, UA: NEGATIVE
Spec Grav, UA: 1.02
Urobilinogen, UA: 0.2
pH, UA: 5

## 2011-09-18 MED ORDER — CIPROFLOXACIN HCL 500 MG PO TABS: 500.0000 mg | ORAL_TABLET | Freq: Two times a day (BID) | ORAL | Status: AC

## 2011-09-18 MED ORDER — VALACYCLOVIR HCL 500 MG PO TABS: 500.0000 mg | ORAL_TABLET | Freq: Two times a day (BID) | ORAL | Status: DC

## 2011-09-18 NOTE — Telephone Encounter (Signed)
Caller: Sharmila/Patient; PCP: Illene Regulus; CB#: [phone]; ; ; Call regarding Urinary Pain Onset-09/11/11   Afebrile. Pt c/o of dysuria and frequency. Emergent s/s of Urinary s/s protocol r/o. Pt to see provider within 24hrs. Appt scheduled for today at 3:30pm with Dr. Felicity Coyer.

## 2011-09-18 NOTE — Progress Notes (Signed)
HPI: complains of UTI symptoms Onset 4 days ago, progressively worse associated with dysuria and small volume voiding with increased frequency denies hematuria, flank pain or fever The patient has a history of prior UTI  Also outbreak of cold sores on lips - Ongoing >2 weeks, wax and wane  Past Medical History  Diagnosis Date  . Bilateral hearing loss   . Otitis media, chronic   . Urinary, incontinence, stress female   . Dyslipidemia   . Allergic rhinitis, seasonal   . Osteoarthritis cervical spine   . Osteoarthritis of hand     bilateral  . Alcohol abuse, in remission   . Depression with anxiety   . Colitis, ischemic 2012  . Sciatica   . Diverticulitis 2012  . Alcohol abuse   . Depression      ROS:  Gen.: No unexpected weight change, no night sweats Lungs: No cough or shortness of breath Cardiovascular: No palpitations or chest pain  PE: BP 118/72  Pulse 105  Temp 97.5 F (36.4 C) (Oral)  SpO2 94% General: No acute distress Lungs: Clear to auscultation Cardiovascular: Regular rate rhythm, no edema Abdomen: Mild to moderate discomfort of her suprapubic region, no flank tenderness to palpation Skin: R mouth with several vesicles upper and lower lip, mild edema and mild erythema  Lab Results  Component Value Date   WBC 4.6 08/30/2011   HGB 13.1 08/30/2011   HCT 39.7 08/30/2011   PLT 153 08/30/2011   GLUCOSE 129* 08/30/2011   CHOL 229* 05/05/2010   TRIG 254.0* 05/05/2010   HDL 78.30 05/05/2010   LDLDIRECT 122.3 05/05/2010   ALT 9 12/26/2010   AST 12 12/26/2010   NA 134* 08/30/2011   K 4.0 08/30/2011   CL 100 08/30/2011   CREATININE 1.15* 08/30/2011   BUN 10 08/30/2011   CO2 22 08/30/2011   TSH 0.809 12/25/2010   INR 1.11 12/26/2010   HGBA1C 5.5 12/25/2010    Assessment/Plan: UTI, classic symptoms with history of same HSV1 - cold sore outbreak x 2 weeks, hx HSV2 -   Empiric Cipro x7 days Valtrex 1g bid x 7 days Hydration recommended education provided

## 2011-09-18 NOTE — Patient Instructions (Signed)
It was good to see you today. Cipro for your bladder infection and Valtrex for your lip infection Your prescription(s) have been submitted to your pharmacy. Please take as directed and contact our office if you believe you are having problem(s) with the medication(s).

## 2011-09-22 ENCOUNTER — Telehealth: Payer: Self-pay | Admitting: Physical Medicine & Rehabilitation

## 2011-09-22 NOTE — Telephone Encounter (Signed)
Any other suggestions?

## 2011-09-22 NOTE — Telephone Encounter (Signed)
How often has she done water classes ? I told her to start with 10 min then slowly increase, she will not see improvement before 3-4 weeks of training her muscles, considered anti inflammatories, but patient is taking lithium, therefore can not give those. Patient should slowly start / progress with water exercises, or just walking in the water, exercising does not give you results after doing it a couple times.

## 2011-09-22 NOTE — Telephone Encounter (Signed)
Having a lot of pain.  The PA Rx to [facility] to take Fibro classes.  Pain is worse with those.  Not functional.  Please call

## 2011-09-25 ENCOUNTER — Telehealth: Payer: Self-pay | Admitting: Internal Medicine
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
## 2011-09-25 NOTE — Telephone Encounter (Signed)
Patient not seen by this Doctor, she was seen by Dr. Felicity Coyer. I have no recommendations having not seen the patient. For continued problem OV Saturday clinic or early next week or an Urgent Care center at their out of town destination.

## 2011-09-28 ENCOUNTER — Encounter: Payer: Self-pay | Admitting: Internal Medicine

## 2011-09-28 ENCOUNTER — Other Ambulatory Visit (INDEPENDENT_AMBULATORY_CARE_PROVIDER_SITE_OTHER): Payer: BC Managed Care – PPO

## 2011-09-28 ENCOUNTER — Ambulatory Visit (INDEPENDENT_AMBULATORY_CARE_PROVIDER_SITE_OTHER): Payer: BC Managed Care – PPO | Admitting: Internal Medicine

## 2011-09-28 VITALS — BP 112/80 | HR 93 | Temp 98.4°F | Resp 16 | Wt 178.0 lb

## 2011-09-28 DIAGNOSIS — R300 Dysuria: Secondary | ICD-10-CM

## 2011-09-28 DIAGNOSIS — B369 Superficial mycosis, unspecified: Secondary | ICD-10-CM

## 2011-09-28 DIAGNOSIS — B370 Candidal stomatitis: Secondary | ICD-10-CM

## 2011-09-28 DIAGNOSIS — B373 Candidiasis of vulva and vagina: Secondary | ICD-10-CM

## 2011-09-28 LAB — URINALYSIS, ROUTINE W REFLEX MICROSCOPIC
Bilirubin Urine: NEGATIVE
Ketones, ur: NEGATIVE
Leukocytes, UA: NEGATIVE
Nitrite: NEGATIVE
Specific Gravity, Urine: 1.015 (ref 1.000–1.030)
Total Protein, Urine: NEGATIVE
Urine Glucose: NEGATIVE
Urobilinogen, UA: 0.2 (ref 0.0–1.0)
pH: 6 (ref 5.0–8.0)

## 2011-09-28 MED ORDER — FLUCONAZOLE 100 MG PO TABS: 100.0000 mg | ORAL_TABLET | Freq: Every day | ORAL | Status: AC

## 2011-09-28 MED ORDER — TERCONAZOLE 0.4 % VA CREA: 1.0000 | TOPICAL_CREAM | Freq: Every day | VAGINAL | Status: AC

## 2011-09-28 MED ORDER — LIDOCAINE VISCOUS 2 % MT SOLN: 5.0000 mL | OROMUCOSAL | Status: AC | PRN

## 2011-09-28 MED ORDER — DIPHENHYD-HYDROCORT-NYSTATIN MT SUSP: 15.0000 mL | Freq: Four times a day (QID) | OROMUCOSAL | Status: DC

## 2011-09-28 MED ORDER — VALACYCLOVIR HCL 500 MG PO TABS: 500.0000 mg | ORAL_TABLET | Freq: Three times a day (TID) | ORAL | Status: DC
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
## 2011-09-28 NOTE — Patient Instructions (Addendum)
Burning with urination - no other signs associated with bacterial urinary track infection - suspect symptoms may all be related to yeast infection. Plan - urinalysis with urine culture for definitive diagnosis  Terazol vaginal applications for probable yeast vaginits  Yeast infection under breasts as well as oral yeast infection. Plan Fluconazole 100 mg oral tablets once a day for 14 days  Nystatin with hydrocortisone  swish and spit 4 times a day  Vaginitis Vaginitis in a soreness, swelling and redness (inflammation) of the vagina and vulva. This is not a sexually transmitted infection.   CAUSES   Yeast vaginitis is caused by yeast (candida) that is normally found in your vagina. With a yeast infection, the candida has over grown in number to a point that upsets the chemical balance. SYMPTOMS    White thick vaginal discharge.   Swelling, itching, redness and irritation of the vagina and possibly the lips of the vagina (vulva).   Burning or painful urination.   Painful intercourse.  HOME CARE INSTRUCTIONS    Finish all medication as prescribed.   Do not have sex until treatment is completed or instructed by your healthcare giver.   Take warm sitz baths.   Do not douche.   Do not use tampons, especially scented ones.   Wear cotton underwear.   Avoid tight pants and panty hose.   Tell your sexual partner that you have a yeast infection. They should go to their caregiver if they have symptoms such as mild rash or itching.   Your sexual partner should be treated if your infection is difficult to eliminate.   Practice safer sex. Use condoms.   Some vaginal medications cause latex condoms to fail. Ask your caregiver this.  SEEK MEDICAL CARE IF:    You develop a fever.   The infection is getting worse after 2 days of treatment.   The infection is not getting better after 3 days of treatment.   You develop blisters in or around your vagina.   You develop vaginal  bleeding, and it is not your menstrual period.   You have pain when you urinate.   You develop intestinal problems.   You have pain with sexual intercourse.  Document Released: 04/02/2004 Document Revised: 02/12/2011 Document Reviewed: 11/08/2008 [facility] Patient Information 2012 [place], [place].   Thrush, Adult   Ginette Pitman is a yeast infection that develops in the mouth and throat and on the tongue. The medical term for this is oropharyngeal candidiasis, or OPC. Ginette Pitman is most common in older adults, but it can occur at any age. Ginette Pitman occurs when a yeast called candida grows out of control. Candida normally is present in small amounts in the mouth and on other mucous membranes. However, under certain circumstances, candida can grow rapidly, causing thrush. Ginette Pitman can be a recurring problem for people who have chronic illnesses or who take medications that limit the body's ability to fight infection (weakened immune system). Since these people have difficulty fighting infections, the fungus that causes thrush can spread throughout the body. This can cause life-threatening blood or organ infections. CAUSES   Candida, the yeast that causes thrush, is normally present in small amounts in the mouth and on other mucous membranes. It usually causes no harm. However, when conditions are present that allow the yeast to grow uncontrolled, it invades surrounding tissues and becomes an infection. Ginette Pitman is most commonly caused by the yeast Candida albicans. Less often, other forms of candida can lead to thrush. There are many  types of bacteria in your mouth that normally control the growth of candida. Sometimes a new type of bacteria gets into your mouth and disrupts the balance of the germs already there. This can allow candida to overgrow. Other factors that increase your risk of developing thrush include:  An impaired ability to fight infection (weakened immune system). A normal immune system is usually  strong enough to prevent candida from overgrowing.   Older adults are more likely to develop thrush because they may have weaker immune systems.   People with human immunodeficiency virus (HIV) infection have a high likelihood of developing thrush. About 90% of people with HIV develop thrush at some point during the course of their disease.   People with diabetes are more likely to get thrush because high blood sugar levels promote overgrowth of the candida fungus.   A dry mouth (xerostomia). Dry mouth can result from overuse of mouthwashes or from certain conditions such as Sjgren's syndrome.   Pregnancy. Hormone changes during pregnancy can lead to thrush by altering the balance of bacteria in the mouth.   Poor dental care, especially in people who have false teeth.   The use of antibiotic medications. This may lead to thrush by changing the balance of bacteria in the mouth.  SYMPTOMS   Thrush can be a mild infection that causes no symptoms. If symptoms develop, they may include the following:  A burning feeling in the mouth and throat. This can occur at the start of a thrush infection.   White patches that adhere to the mouth and tongue. The tissue around the patches may be red, raw, and painful. If rubbed (during tooth brushing, for example), the patches and the tissue of the mouth may bleed easily.   A bad taste in the mouth or difficulty tasting foods.   Cottony feeling in the mouth.   Sometimes pain during eating and swallowing.  DIAGNOSIS   Your caregiver can usually diagnose thrush by exam. In addition to looking in your mouth, your caregiver will ask you questions about your health. TREATMENT   Medications that help prevent the growth of fungi (antifungals) are the standard treatment for thrush. These medications are either applied directly to the affected area (topical) or swallowed (oral). Mild thrush In adults, mild cases of thrush may clear up with simple treatment that  can be done at home. This treatment usually involves using an antifungal mouth rinse or lozenges. Treatment usually lasts about 14 days. Moderate to severe thrush  More severe thrush infections that have spread to the esophagus are treated with an oral antifungal medication. A topical antifungal medication may also be used.   For some severe infections, a treatment period longer than 14 days may be needed.   Oral antifungal medications are almost never used during pregnancy because the fetus may be harmed. However, if a pregnant woman has a rare, severe thrush infection that has spread to her blood, oral antifungal medications may be used. In this case, the risk of harm to the mother and fetus from the severe thrush infection may be greater than the risk posed by the use of antifungal medications.  Persistent or recurrent thrush Persistent (does not go away) or recurrent (keeps coming back) cases of thrush may:  Need to be treated twice as long as the symptoms last.   Require treatment with both oral and topical antifungal medications.   People with weakened immune systems can take an antifungal medication on a continuous basis to  prevent thrush infections.  It is important to treat conditions that make you more likely to get thrush, such as diabetes, human immunodeficiency virus (HIV), or cancer.  HOME CARE INSTRUCTIONS    If you are breast-feeding, you should clean your nipples with an antifungal medication, such as nystatin (Mycostatin). Dry your nipples after breast-feeding. Applying lanolin-containing body lotion may help relieve nipple soreness.   If you wear dentures and get thrush, remove dentures before going to bed, brush them vigorously, and soak in a solution of chlorhexidine gluconate or a product such as Polident or Efferdent.   Eating plain, unflavored yogurt that contains live cultures (check the label) can also help cure thrush. Yogurt helps healthy bacteria grow in the  mouth. These bacteria stop the growth of the yeast that causes thrush.   Adults can treat thrush at home with gentian violet (1%), a dye that kills bacteria and fungi. It is available without a prescription. If there is no known cause for the infection or if gentian violet does not cure the thrush, you need to see your caregiver.  Comfort measures Measures can be taken to reduce the discomfort of thrush:  Drink cold liquids such as water or iced tea. Eat flavored ice treats or frozen juices.   Eat foods that are easy to swallow such as gelatin, ice cream, or custard.   If the patches are painful, try drinking from a straw.   Rinse your mouth several times a day with a warm saltwater rinse. You can make the saltwater mixture with 1 tsp (5 g) of salt in 8 fl oz (0.2 L) of warm water.  PROGNOSIS    Most cases of thrush are mild and clear up with the use of an antifungal mouth rinse or lozenges. Very mild cases of thrush may clear up without medical treatment. It usually takes about 14 days of treatment with an oral antifungal medication to cure more severe thrush infections. In some cases, thrush may last several weeks even with treatment.   If thrush goes untreated and does not go away by itself, it can spread to other parts of the body.   Thrush can spread to the throat, the vagina, or the skin. It rarely spreads to other organs of the body.  Ginette Pitman is more likely to recur (come back) in:  People who use inhaled corticosteroids to treat asthma.   People who take antibiotic medications for a long time.   People who have false teeth.   People who have a weakened immune system.  RISKS AND COMPLICATIONS Complications related to thrush are rare in healthy people. There are several factors that can increase your risk of developing thrush. Age Older adults, especially those who have serious health problems, are more likely to develop thrush because their immune systems are likely to be  weaker. Behavior  The yeast that causes thrush can be spread by oral sex.   Heavy smoking can lower the body's ability to fight off infections. This makes thrush more likely to develop.  Other conditions  False teeth (dentures), braces, or a retainer that irritates the mouth make it hard to keep the mouth clean. An unclean mouth is more likely to develop thrush than a clean mouth.   People with a weakened immune system, such as those who have diabetes or human immunodeficiency virus (HIV) or who are undergoing chemotherapy, have an increased risk for developing thrush.  Medications Some medications can allow the fungus that causes thrush to grow uncontrolled.  Common ones are:  Antibiotics, especially those that kill a wide range of organisms (broad-spectrum antibiotics), such as tetracycline commonly can cause thrush.   Birth control pills (oral contraceptives).   Medications that weaken the body's immune system, such as corticosteroids.  Environment Exposure over time to certain environmental chemicals, such as benzene and pesticides, can weaken the body's immune system. This increases your risk for developing infections, including thrush. SEEK IMMEDIATE MEDICAL CARE IF:  Your symptoms are getting worse or are not improving within 7 days of starting treatment.   You have symptoms of spreading infection, such as white patches on the skin outside of the mouth.   You are nursing and you have redness and pain in the nipples in spite of home treatment or if you have burning pain in the nipple area when you nurse. Your baby's mouth should also be examined to determine whether thrush is causing your symptoms.  Document Released: 11/19/2003 Document Revised: 02/12/2011 Document Reviewed: 02/29/2008 [facility] Patient Information 2012 [place], [place].

## 2011-09-28 NOTE — Telephone Encounter (Signed)
Pt has appt scheduled with [facility] today 07/22  3:45 p

## 2011-09-28 NOTE — Telephone Encounter (Signed)
Continue to Valtrex, another week erx done and use lidocaine gel to sore areas to help with pain. OV with PCP if still unimproved

## 2011-09-28 NOTE — Telephone Encounter (Signed)
Left message on machine for pt to return my call  

## 2011-09-30 ENCOUNTER — Telehealth: Payer: Self-pay | Admitting: *Deleted

## 2011-09-30 LAB — URINE CULTURE
Colony Count: NO GROWTH
Organism ID, Bacteria: NO GROWTH

## 2011-09-30 NOTE — Telephone Encounter (Signed)
Patient notified of lab UA results. Patient states she is feeling no better. Her lips are still very swollen. States using mouth wash and taking medication

## 2011-09-30 NOTE — Telephone Encounter (Signed)
Please call pt. Tomorrow - for continued swelling of the lips may need to consider allergic reaction and use steroids.

## 2011-09-30 NOTE — Telephone Encounter (Signed)
error 

## 2011-09-30 NOTE — Telephone Encounter (Signed)
Message copied by Elnora Morrison on Wed Sep 30, 2011  1:25 PM ------      Message from: Jacques Navy      Created: Tue Sep 29, 2011  8:58 AM       Please call patient - u/a without evidence of infection. Continue antifungal therapy

## 2011-10-14 ENCOUNTER — Encounter
Payer: BC Managed Care – PPO | Attending: Physical Medicine and Rehabilitation | Admitting: Physical Medicine and Rehabilitation

## 2011-10-14 ENCOUNTER — Encounter: Payer: Self-pay | Admitting: Physical Medicine and Rehabilitation

## 2011-10-14 VITALS — BP 137/67 | HR 82 | Resp 14 | Ht 62.0 in | Wt 178.0 lb

## 2011-10-14 DIAGNOSIS — M51379 Other intervertebral disc degeneration, lumbosacral region without mention of lumbar back pain or lower extremity pain: Secondary | ICD-10-CM

## 2011-10-14 DIAGNOSIS — IMO0001 Reserved for inherently not codable concepts without codable children: Secondary | ICD-10-CM

## 2011-10-14 DIAGNOSIS — M5136 Other intervertebral disc degeneration, lumbar region: Secondary | ICD-10-CM

## 2011-10-14 DIAGNOSIS — M797 Fibromyalgia: Secondary | ICD-10-CM

## 2011-10-14 DIAGNOSIS — M545 Low back pain, unspecified: Secondary | ICD-10-CM

## 2011-10-14 DIAGNOSIS — M47812 Spondylosis without myelopathy or radiculopathy, cervical region: Secondary | ICD-10-CM

## 2011-10-14 DIAGNOSIS — G8929 Other chronic pain: Secondary | ICD-10-CM | POA: Insufficient documentation

## 2011-10-14 DIAGNOSIS — M5137 Other intervertebral disc degeneration, lumbosacral region: Secondary | ICD-10-CM

## 2011-10-14 DIAGNOSIS — M79609 Pain in unspecified limb: Secondary | ICD-10-CM | POA: Insufficient documentation

## 2011-10-14 MED ORDER — BUPRENORPHINE 20 MCG/HR TD PTWK: 20.0000 ug | MEDICATED_PATCH | TRANSDERMAL | Status: DC

## 2011-10-14 NOTE — Patient Instructions (Signed)
Try to look into the silver sneakers program and whether your insurance supports this program. Try to increase your walking slowly.
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
## 2011-11-16 ENCOUNTER — Encounter
Payer: BC Managed Care – PPO | Attending: Physical Medicine and Rehabilitation | Admitting: Physical Medicine and Rehabilitation

## 2011-11-16 ENCOUNTER — Encounter: Payer: Self-pay | Admitting: Physical Medicine and Rehabilitation

## 2011-11-16 VITALS — BP 127/79 | HR 76 | Ht 62.0 in | Wt 180.4 lb

## 2011-11-16 DIAGNOSIS — E785 Hyperlipidemia, unspecified: Secondary | ICD-10-CM | POA: Insufficient documentation

## 2011-11-16 DIAGNOSIS — F319 Bipolar disorder, unspecified: Secondary | ICD-10-CM | POA: Insufficient documentation

## 2011-11-16 DIAGNOSIS — M47817 Spondylosis without myelopathy or radiculopathy, lumbosacral region: Secondary | ICD-10-CM

## 2011-11-16 DIAGNOSIS — M47816 Spondylosis without myelopathy or radiculopathy, lumbar region: Secondary | ICD-10-CM

## 2011-11-16 DIAGNOSIS — M797 Fibromyalgia: Secondary | ICD-10-CM

## 2011-11-16 DIAGNOSIS — M545 Low back pain, unspecified: Secondary | ICD-10-CM | POA: Insufficient documentation

## 2011-11-16 DIAGNOSIS — IMO0001 Reserved for inherently not codable concepts without codable children: Secondary | ICD-10-CM | POA: Insufficient documentation

## 2011-11-16 MED ORDER — BUPRENORPHINE 20 MCG/HR TD PTWK: 20.0000 ug | MEDICATED_PATCH | TRANSDERMAL | Status: DC
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
## 2011-11-16 NOTE — Patient Instructions (Signed)
Stay as active as possible, try to walk as tolerated.

## 2011-11-23 ENCOUNTER — Ambulatory Visit (INDEPENDENT_AMBULATORY_CARE_PROVIDER_SITE_OTHER): Payer: BC Managed Care – PPO | Admitting: Internal Medicine

## 2011-11-23 ENCOUNTER — Telehealth: Payer: Self-pay | Admitting: Internal Medicine

## 2011-11-23 ENCOUNTER — Encounter: Payer: Self-pay | Admitting: Internal Medicine

## 2011-11-23 VITALS — BP 124/76 | HR 73 | Temp 97.0°F | Resp 14 | Wt 177.2 lb

## 2011-11-23 DIAGNOSIS — H6010 Cellulitis of external ear, unspecified ear: Secondary | ICD-10-CM

## 2011-11-23 DIAGNOSIS — B379 Candidiasis, unspecified: Secondary | ICD-10-CM

## 2011-11-23 DIAGNOSIS — H00019 Hordeolum externum unspecified eye, unspecified eyelid: Secondary | ICD-10-CM

## 2011-11-23 DIAGNOSIS — H60399 Other infective otitis externa, unspecified ear: Secondary | ICD-10-CM

## 2011-11-23 MED ORDER — NYSTATIN 100000 UNIT/GM EX OINT: TOPICAL_OINTMENT | Freq: Two times a day (BID) | CUTANEOUS | Status: DC

## 2011-11-23 MED ORDER — CEPHALEXIN 500 MG PO CAPS: 500.0000 mg | ORAL_CAPSULE | Freq: Three times a day (TID) | ORAL | Status: DC

## 2011-11-23 MED ORDER — POLYMYXIN B-TRIMETHOPRIM 10000-0.1 UNIT/ML-% OP SOLN: 1.0000 [drp] | OPHTHALMIC | Status: DC

## 2011-11-23 NOTE — Progress Notes (Signed)
  Subjective:    Patient ID: [name] [name], female    DOB: 1945/08/14, 66 y.o.   MRN: [id number]  HPI  See CC Denies outdoor exposure, new detergents or new meds No injury, travel or new pets Has used vitamin E to lips without improvement Also previous treatment with Diflucan tablets and powder ineffective yeast control  Past Medical History  Diagnosis Date  . Bilateral hearing loss   . Otitis media, chronic   . Urinary, incontinence, stress female   . Dyslipidemia   . Allergic rhinitis, seasonal   . Osteoarthritis cervical spine   . Osteoarthritis of hand     bilateral  . Alcohol abuse, in remission   . Depression with anxiety   . Colitis, ischemic 2012  . Sciatica   . Diverticulitis 2012  . Alcohol abuse   . Depression      Review of Systems  Constitutional: Positive for fever and fatigue.  HENT: Positive for ear pain. Negative for sore throat, facial swelling, drooling, mouth sores, neck stiffness and dental problem.   Eyes: Negative for photophobia and visual disturbance.  Skin: Positive for rash. Negative for pallor and wound.       Objective:   Physical Exam BP 124/76  Pulse 73  Temp 97 F (36.1 C) (Oral)  Resp 14  Wt 177 lb 4 oz (80.4 kg)  SpO2 94% Constitutional: She is overweight, appears well-developed and well-nourished. No distress.  nontoxic HENT: Head: Normocephalic and atraumatic. Ears: B TMs ok, no erythema or effusion -but left earlobe infection, CT scan below; Nose: Nose normal. Mouth/Throat: Dry skin around lips, no. Oral dermatitis. Oropharynx is clear and moist. No oropharyngeal exudate.  Eyes:  Stye of left eye -no purulence expressed. Vision intact. Conjunctivae and EOM are normal. Pupils are equal, round, and reactive to light. No scleral icterus.  Neck: Normal range of motion. Neck supple. No JVD or LAD present. No thyromegaly present.  Cardiovascular: Normal rate, regular rhythm and normal heart sounds.  No murmur heard. No BLE  edema. Pulmonary/Chest: Effort normal and breath sounds normal. No respiratory distress. She has no wheezes.   Skin: Cellulitis with redness, swelling and crusting at left earlobe - Candida changes at full beneath breast, pannus and groin   Psychiatric: She has a normal mood and affect. Her behavior is normal. Judgment and thought content normal.   Lab Results  Component Value Date   WBC 4.6 08/30/2011   HGB 13.1 08/30/2011   HCT 39.7 08/30/2011   PLT 153 08/30/2011   GLUCOSE 129* 08/30/2011   CHOL 229* 05/05/2010   TRIG 254.0* 05/05/2010   HDL 78.30 05/05/2010   LDLDIRECT 122.3 05/05/2010   ALT 9 12/26/2010   AST 12 12/26/2010   NA 134* 08/30/2011   K 4.0 08/30/2011   CL 100 08/30/2011   CREATININE 1.15* 08/30/2011   BUN 10 08/30/2011   CO2 22 08/30/2011   TSH 0.809 12/25/2010   INR 1.11 12/26/2010   HGBA1C 5.5 12/25/2010       Assessment & Plan:  Cellulitis, left earlobe. No evidence of otitis externa -treat with Keflex x1 week  Stye, left eye - antibiotic drops and symptomatic care advised with compress  Candidiasis - reports failure to respond to Diflucan in past. Treat with nystatin ointment at this time

## 2011-11-23 NOTE — Patient Instructions (Signed)
It was good to see you today. Use Keflex antibiotics for your earlobe infection Use eyedrops as discussed Use nystatin ointment for skin yeast infection Your prescription(s) have been submitted to your pharmacy. Please take as directed and contact our office if you believe you are having problem(s) with the medication(s).  Sty A sty (hordeolum) is an infection of a gland in the eyelid located at the base of the eyelash. A sty may develop a white or yellow head of pus. It can be puffy (swollen). Usually, the sty will burst and pus will come out on its own. They do not leave lumps in the eyelid once they drain. A sty is often confused with another form of cyst of the eyelid called a chalazion. Chalazions occur within the eyelid and not on the edge where the bases of the eyelashes are. They often are red, sore and then form firm lumps in the eyelid. CAUSES    Germs (bacteria).   Lasting (chronic) eyelid inflammation.  SYMPTOMS    Tenderness, redness and swelling along the edge of the eyelid at the base of the eyelashes.   Sometimes, there is a white or yellow head of pus. It may or may not drain.  DIAGNOSIS   An ophthalmologist will be able to distinguish between a sty and a chalazion and treat the condition appropriately.   TREATMENT    Styes are typically treated with warm packs (compresses) until drainage occurs.   In rare cases, medicines that kill germs (antibiotics) may be prescribed. These antibiotics may be in the form of drops, cream or pills.   If a hard lump has formed, it is generally necessary to do a small incision and remove the hardened contents of the cyst in a minor surgical procedure done in the office.   In suspicious cases, your caregiver may send the contents of the cyst to the lab to be certain that it is not a rare, but dangerous form of cancer of the glands of the eyelid.  HOME CARE INSTRUCTIONS    Wash your hands often and dry them with a clean towel. Avoid  touching your eyelid. This may spread the infection to other parts of the eye.   Apply heat to your eyelid for 10 to 20 minutes, several times a day, to ease pain and help to heal it faster.   Do not squeeze the sty. Allow it to drain on its own. Wash your eyelid carefully 3 to 4 times per day to remove any pus.  SEEK IMMEDIATE MEDICAL CARE IF:    Your eye becomes painful or puffy (swollen).   Your vision changes.   Your sty does not drain by itself within 3 days.   Your sty comes back within a short period of time, even with treatment.   You have redness (inflammation) around the eye.   You have a fever.  Document Released: 12/03/2004 Document Revised: 02/12/2011 Document Reviewed: 08/07/2008 [facility] Patient Information 2012 [place], [place].Cutaneous Candidiasis Cutaneous candidiasis is a condition in which there is an overgrowth of yeast (candida) on the skin. Yeast normally live on the skin, but in small enough numbers not to cause any symptoms. In certain cases, increased growth of the yeast may cause an actual yeast infection. This kind of infection usually occurs in areas of the skin that are constantly warm and moist, such as the armpits or the groin. Yeast is the most common cause of diaper rash in babies and in people who cannot control their  bowel movements (incontinence). CAUSES   The fungus that most often causes cutaneous candidiasis is Candida albicans. Conditions that can increase the risk of getting a yeast infection of the skin include:  Obesity.   Pregnancy.   Diabetes.   Taking antibiotic medicine.   Taking birth control pills.   Taking steroid medicines.   Thyroid disease.   An iron or zinc deficiency.   Problems with the immune system.  SYMPTOMS    Red, swollen area of the skin.   Bumps on the skin.   Itchiness.  DIAGNOSIS   The diagnosis of cutaneous candidiasis is usually based on its appearance. Light scrapings of the skin may also be taken  and viewed under a microscope to identify the presence of yeast. TREATMENT   Antifungal creams may be applied to the infected skin. In severe cases, oral medicines may be needed.   HOME CARE INSTRUCTIONS    Keep your skin clean and dry.   Maintain a healthy weight.   If you have diabetes, keep your blood sugar under control.  SEEK IMMEDIATE MEDICAL CARE IF:  Your rash continues to spread despite treatment.   You have a fever, chills, or abdominal pain.  Document Released: 11/11/2010 Document Revised: 02/12/2011 Document Reviewed: 11/11/2010 [facility] Patient Information 2012 [place], [place].

## 2011-11-23 NOTE — Telephone Encounter (Signed)
Caller: Lakela/Patient; Patient Name: [name] [name]; PCP: Illene Regulus; Best Callback Phone Number: [phone]; Reason for call: Having Pain, Redness and Swelling in L ear lobe and glands swollen on L side of neck. L eye hurts and is puffy, Lips are swollen and peeling for past few weeks. She has clear fluid draining from Piercing hole- put Neosporin on it and not helping. She is having night sweats but not doesn't think she is running fever. Pain level #8 on 1-10 scale - having trouble sleeping d/t pain. She is taking Ibuprofen 200 mgs 4 tabs PO BID. Triage and Care advice per Ear Symptoms Protocol and appointment advised within 4 hours for "Severe pain unresponsive to 24 hours of home care". Appointment scheduled with Dr. Felicity Coyer @ 1115 -11/23/11.

## 2011-11-27 ENCOUNTER — Telehealth: Payer: Self-pay | Admitting: Internal Medicine

## 2011-11-27 ENCOUNTER — Emergency Department (HOSPITAL_COMMUNITY)
Admission: EM | Admit: 2011-11-27 | Discharge: 2011-11-27 | Disposition: A | Discharge status: Home / Self Care | Payer: BC Managed Care – PPO | Attending: Emergency Medicine | Admitting: Emergency Medicine

## 2011-11-27 ENCOUNTER — Emergency Department (HOSPITAL_COMMUNITY): Payer: BC Managed Care – PPO

## 2011-11-27 ENCOUNTER — Encounter (HOSPITAL_COMMUNITY): Payer: Self-pay | Admitting: Emergency Medicine

## 2011-11-27 DIAGNOSIS — R0602 Shortness of breath: Secondary | ICD-10-CM | POA: Insufficient documentation

## 2011-11-27 DIAGNOSIS — J40 Bronchitis, not specified as acute or chronic: Secondary | ICD-10-CM | POA: Insufficient documentation

## 2011-11-27 DIAGNOSIS — R300 Dysuria: Secondary | ICD-10-CM | POA: Insufficient documentation

## 2011-11-27 DIAGNOSIS — Z79899 Other long term (current) drug therapy: Secondary | ICD-10-CM | POA: Insufficient documentation

## 2011-11-27 LAB — CBC
HCT: 40.4 % (ref 36.0–46.0)
Hemoglobin: 13.5 g/dL (ref 12.0–15.0)
MCH: 30.7 pg (ref 26.0–34.0)
MCHC: 33.4 g/dL (ref 30.0–36.0)
MCV: 91.8 fL (ref 78.0–100.0)
Platelets: 177 10*3/uL (ref 150–400)
RBC: 4.4 MIL/uL (ref 3.87–5.11)
RDW: 12.4 % (ref 11.5–15.5)
WBC: 9.1 10*3/uL (ref 4.0–10.5)

## 2011-11-27 LAB — BASIC METABOLIC PANEL
BUN: 8 mg/dL (ref 6–23)
CO2: 24 mEq/L (ref 19–32)
Calcium: 9.1 mg/dL (ref 8.4–10.5)
Chloride: 99 mEq/L (ref 96–112)
Creatinine, Ser: 0.92 mg/dL (ref 0.50–1.10)
GFR calc Af Amer: 75 mL/min — ABNORMAL LOW (ref >90)
GFR calc non Af Amer: 65 mL/min — ABNORMAL LOW (ref >90)
Glucose, Bld: 163 mg/dL — ABNORMAL HIGH (ref 70–99)
Potassium: 3.4 mEq/L — ABNORMAL LOW (ref 3.5–5.1)
Sodium: 134 mEq/L — ABNORMAL LOW (ref 135–145)

## 2011-11-27 LAB — TROPONIN I: Troponin I: <0.3 ng/mL (ref <0.30)

## 2011-11-27 LAB — URINALYSIS, ROUTINE W REFLEX MICROSCOPIC
Bilirubin Urine: NEGATIVE
Glucose, UA: 250 mg/dL — AB
Hgb urine dipstick: NEGATIVE
Ketones, ur: NEGATIVE mg/dL
Leukocytes, UA: NEGATIVE
Nitrite: NEGATIVE
Protein, ur: NEGATIVE mg/dL
Specific Gravity, Urine: 1.011 (ref 1.005–1.030)
Urobilinogen, UA: 0.2 mg/dL (ref 0.0–1.0)
pH: 6.5 (ref 5.0–8.0)

## 2011-11-27 LAB — RAPID STREP SCREEN (MED CTR MEBANE ONLY): Streptococcus, Group A Screen (Direct): NEGATIVE

## 2011-11-27 LAB — PRO B NATRIURETIC PEPTIDE: Pro B Natriuretic peptide (BNP): 64.7 pg/mL (ref 0–125)

## 2011-11-27 MED ORDER — BENZONATATE 100 MG PO CAPS: 100.0000 mg | ORAL_CAPSULE | Freq: Three times a day (TID) | ORAL | Status: DC

## 2011-11-27 MED ORDER — SODIUM CHLORIDE 0.9 % IV SOLN
INTRAVENOUS | Status: DC
  Administered 2011-11-27: 16:00:00 via INTRAVENOUS

## 2011-11-27 NOTE — Telephone Encounter (Signed)
EMERGENT CALL:THE PATIENT REFUSED 911:  Caller: [name] [name]/Patient; Patient Name: [name] [name]; PCP: Illene Regulus (Adults only); Best Callback Phone Number: [phone]; Reason for call: Cough/Congestion; Symptoms started 11/24/11; symptoms include cough and congestion; coughing up green to yellow discharge; body aches; unsure if has a fever and no way of checking temperature due to out of town;very short of breath when she tried to get up to do anything; very weak feeling; Triaged per Flu Like Symptoms Guideline; See in ED Immediately due to breathing problems; in [place] now; instructed to go to nearest ED within the next hour; will comply; OFFICE PT SENT TO NEAREST ED SINCE SHE IS OUT OF TOWN NOW;  husband will drive

## 2011-11-27 NOTE — ED Notes (Signed)
Patient transported to X-ray 

## 2011-11-27 NOTE — ED Provider Notes (Addendum)
History     CSN: [id number]  Arrival date & time 11/27/11  1442   First MD Initiated Contact with Patient 11/27/11 1523      No chief complaint on file.   (Consider location/radiation/quality/duration/timing/severity/associated sxs/prior treatment) The history is provided by the patient and the spouse.   66 year old, female, presents to emergency department complaining of generalized weakness along with a cough with green sputum, shaking, chills, and sweating, along with dysuria.  She denies nausea, vomiting.  She denies a headache, or sore throat.  She denies chest pain, abdominal pain, hematuria, or diarrhea.  Past Medical History  Diagnosis Date  . Bilateral hearing loss   . Otitis media, chronic   . Urinary, incontinence, stress female   . Dyslipidemia   . Allergic rhinitis, seasonal   . Osteoarthritis cervical spine   . Osteoarthritis of hand     bilateral  . Alcohol abuse, in remission   . Depression with anxiety   . Colitis, ischemic 2012  . Sciatica   . Diverticulitis 2012  . Alcohol abuse   . Depression     Past Surgical History  Procedure Date  . D&c x 4   . Btl laproscopic   . Abdominal hysterectomy   . Tonsillectomy   . Excision of tumors at right neck     angle of jaw '68, benign  . Hearing loss     BIL HEARING LOSS  . Otitis   . Urinary incontinence   . Dislipidemia     Family History  Problem Relation Age of Onset  . Diabetes Brother   . Alcohol abuse Brother     x 2  . Drug abuse Mother   . Alcohol abuse Mother   . Alcohol abuse Father   . Colon cancer Paternal Uncle     History  Substance Use Topics  . Smoking status: Never Smoker   . Smokeless tobacco: Never Used  . Alcohol Use: No     alcohol abuse formally; has been abstinent x 3 years    OB History    Grav Para Term Preterm Abortions TAB SAB Ect Mult Living                  Review of Systems  Constitutional: Positive for fever, chills and diaphoresis.  HENT: Negative  for sore throat, trouble swallowing, neck pain and voice change.   Respiratory: Positive for cough.   Cardiovascular: Negative for chest pain.  Gastrointestinal: Negative for abdominal pain and diarrhea.  Genitourinary: Positive for dysuria. Negative for hematuria.  Musculoskeletal: Negative for back pain.  Skin: Negative for rash.  Neurological: Negative for headaches.  Hematological: Does not bruise/bleed easily.  Psychiatric/Behavioral: Negative for confusion.  All other systems reviewed and are negative.    Allergies  Codeine; Neurontin; Oxycodone; Procaine hcl; and Sulfur  Home Medications   Current Outpatient Rx  Name Route Sig Dispense Refill  . ARIPIPRAZOLE 2 MG PO TABS Oral Take 2 mg by mouth daily.      Marland Kitchen BUPRENORPHINE 20 MCG/HR TD PTWK Transdermal Place 1 patch (20 mcg total) onto the skin once a week. friday 4 patch 0  . CEPHALEXIN 500 MG PO CAPS Oral Take 1 capsule (500 mg total) by mouth 3 (three) times daily. 21 capsule 0  . CHOLECALCIFEROL 400 UNITS PO TABS Oral Take 400 Units by mouth daily.    Marland Kitchen CLONIDINE HCL 0.1 MG PO TABS Oral Take 0.1 mg by mouth at bedtime.    Marland Kitchen DIAZEPAM  5 MG PO TABS Oral Take 5 mg by mouth 2 (two) times daily.     Marland Kitchen ESCITALOPRAM OXALATE 10 MG PO TABS Oral Take 15 mg by mouth daily. Pt takes 1 and 1/2 tab daily    . ESTROGENS CONJ SYNTHETIC A 0.625 MG PO TABS Oral Take 1.25 mg by mouth at bedtime. Pt takes 2 tabs at bedtime    . FLUTICASONE PROPIONATE 50 MCG/ACT NA SUSP Nasal Place 2 sprays into the nose daily. 100 g 2  . IBUPROFEN 200 MG PO TABS Oral Take 800 mg by mouth every 6 (six) hours as needed. For pain/fever    . LACTULOSE 10 GM/15ML PO SOLN  Take 30 ml three times a day as needed for constipation. 500 mL 2  . LEVOTHYROXINE SODIUM 75 MCG PO TABS Oral Take 75 mcg by mouth daily.      Marland Kitchen LITHIUM CARBONATE 300 MG PO CAPS Oral Take 600 mg by mouth at bedtime.     . NYSTATIN 100000 UNIT/GM EX OINT Topical Apply topically 2 (two) times daily.  Apply to affected skin 30 g 0  . OMEGA-3-ACID ETHYL ESTERS 1 G PO CAPS Oral Take 2 g by mouth 2 (two) times daily.    . QUETIAPINE FUMARATE 100 MG PO TABS Oral Take 100 mg by mouth 4 (four) times daily.     Marland Kitchen RANITIDINE HCL 150 MG PO TABS Oral Take 150 mg by mouth 2 (two) times daily.    Marland Kitchen ROPINIROLE HCL 0.5 MG PO TABS Oral Take 0.5 mg by mouth at bedtime.     Marland Kitchen POLYMYXIN B-TRIMETHOPRIM 10000-0.1 UNIT/ML-% OP SOLN Ophthalmic Apply 1 drop to eye every 4 (four) hours. 10 mL 0  . VALACYCLOVIR HCL 500 MG PO TABS Oral Take 1 tablet (500 mg total) by mouth 3 (three) times daily. 21 tablet 0    BP 122/71  Pulse 96  Temp 99.1 F (37.3 C) (Oral)  Resp 20  SpO2 96%  Physical Exam  Nursing note and vitals reviewed. Constitutional: She is oriented to person, place, and time. She appears well-developed and well-nourished. No distress.  HENT:  Head: Normocephalic and atraumatic.  Right Ear: External ear normal.  Left Ear: External ear normal.  Mouth/Throat: Oropharynx is clear and moist. No oropharyngeal exudate.  Eyes: Conjunctivae normal and EOM are normal.  Neck: Normal range of motion. Neck supple.  Cardiovascular: Normal rate, regular rhythm and intact distal pulses.   No murmur heard. Pulmonary/Chest: Effort normal and breath sounds normal. She has no rales.  Abdominal: Soft. Bowel sounds are normal. There is no tenderness. There is no rebound.  Musculoskeletal: Normal range of motion. She exhibits no edema and no tenderness.  Neurological: She is alert and oriented to person, place, and time. No cranial nerve deficit.  Skin: Skin is warm.  Psychiatric: She has a normal mood and affect. Thought content normal.    ED Course  Procedures (including critical care time) 66 year old, female, presents with cough, fevers, chills, dysuria, and generalized myalgias.  Her physical examination is unremarkable.  She's not in any distress and has no signs of toxicity.  We will do a chest x-ray, to  look for pneumonia, and blood and urine tests, to look for signs of systemic illness or urinary tract infection, causing her dysuria   Labs Reviewed  CBC  BASIC METABOLIC PANEL  PRO B NATRIURETIC PEPTIDE  TROPONIN I  RAPID STREP SCREEN  URINALYSIS, ROUTINE W REFLEX MICROSCOPIC   No results found.  No diagnosis found.    MDM  Bronchitis No pneumonia.  No hypoxia, or respiratory distress. No urinary tract infection. Nontoxic        Cheri Guppy, MD 11/27/11 1633  Cheri Guppy, MD 11/27/11 [phone]

## 2011-11-27 NOTE — ED Notes (Signed)
Pt reports that last week she started feeling weak, developed cough, sore throat, pain to right side of chest, nausea, sob. States started feeling worse x 2 days ago and couldn't get out of bed. States she feels like she has the flu. NAD noted at this time.

## 2011-11-27 NOTE — ED Notes (Signed)
Pt presenting to ed with multiple c/o bodyaches fever, congestion and coughing up greenish colored mucus, pt states she hasn't felt well in 2-3 days. Pt states she has positive shortness of breath and her nose feels stuffy. Pt states she also has sore throat, pt states she had positive chest pain this morning but not now. Pt states positive nausea no vomiting. Pt states she called her pcp and was told to present to ed

## 2011-11-27 NOTE — ED Notes (Signed)
Dr. Caporossi at bedside. 

## 2011-12-01 ENCOUNTER — Emergency Department (HOSPITAL_COMMUNITY)
Admission: EM | Admit: 2011-12-01 | Discharge: 2011-12-02 | Disposition: A | Discharge status: Home / Self Care | Payer: BC Managed Care – PPO | Attending: Emergency Medicine | Admitting: Emergency Medicine

## 2011-12-01 ENCOUNTER — Emergency Department (HOSPITAL_COMMUNITY): Payer: BC Managed Care – PPO

## 2011-12-01 DIAGNOSIS — E785 Hyperlipidemia, unspecified: Secondary | ICD-10-CM | POA: Insufficient documentation

## 2011-12-01 DIAGNOSIS — F411 Generalized anxiety disorder: Secondary | ICD-10-CM | POA: Insufficient documentation

## 2011-12-01 DIAGNOSIS — J40 Bronchitis, not specified as acute or chronic: Secondary | ICD-10-CM

## 2011-12-01 DIAGNOSIS — M47812 Spondylosis without myelopathy or radiculopathy, cervical region: Secondary | ICD-10-CM | POA: Insufficient documentation

## 2011-12-01 DIAGNOSIS — R509 Fever, unspecified: Secondary | ICD-10-CM | POA: Insufficient documentation

## 2011-12-01 DIAGNOSIS — M19049 Primary osteoarthritis, unspecified hand: Secondary | ICD-10-CM | POA: Insufficient documentation

## 2011-12-01 DIAGNOSIS — Z90710 Acquired absence of both cervix and uterus: Secondary | ICD-10-CM | POA: Insufficient documentation

## 2011-12-01 DIAGNOSIS — R062 Wheezing: Secondary | ICD-10-CM | POA: Insufficient documentation

## 2011-12-01 DIAGNOSIS — R079 Chest pain, unspecified: Secondary | ICD-10-CM | POA: Insufficient documentation

## 2011-12-01 DIAGNOSIS — F329 Major depressive disorder, single episode, unspecified: Secondary | ICD-10-CM | POA: Insufficient documentation

## 2011-12-01 DIAGNOSIS — F3289 Other specified depressive episodes: Secondary | ICD-10-CM | POA: Insufficient documentation

## 2011-12-01 MED ORDER — ALBUTEROL SULFATE (5 MG/ML) 0.5% IN NEBU
5.0000 mg | INHALATION_SOLUTION | Freq: Once | RESPIRATORY_TRACT | Status: AC
  Administered 2011-12-02: 5 mg via RESPIRATORY_TRACT
  Filled 2011-12-01: qty 1

## 2011-12-01 NOTE — ED Notes (Signed)
Pt c/o chest pain cough and  fever. No LOC/N/V.VSS

## 2011-12-02 LAB — CBC
HCT: 38.3 % (ref 36.0–46.0)
Hemoglobin: 13 g/dL (ref 12.0–15.0)
MCH: 30.4 pg (ref 26.0–34.0)
MCHC: 33.9 g/dL (ref 30.0–36.0)
MCV: 89.7 fL (ref 78.0–100.0)
Platelets: 203 10*3/uL (ref 150–400)
RBC: 4.27 MIL/uL (ref 3.87–5.11)
RDW: 12.4 % (ref 11.5–15.5)
WBC: 7.5 10*3/uL (ref 4.0–10.5)

## 2011-12-02 LAB — POCT I-STAT, CHEM 8
BUN: 10 mg/dL (ref 6–23)
Calcium, Ion: 1.18 mmol/L (ref 1.13–1.30)
Chloride: 106 mEq/L (ref 96–112)
Creatinine, Ser: 1 mg/dL (ref 0.50–1.10)
Glucose, Bld: 135 mg/dL — ABNORMAL HIGH (ref 70–99)
HCT: 37 % (ref 36.0–46.0)
Hemoglobin: 12.6 g/dL (ref 12.0–15.0)
Potassium: 3.6 mEq/L (ref 3.5–5.1)
Sodium: 138 mEq/L (ref 135–145)
TCO2: 23 mmol/L (ref 0–100)

## 2011-12-02 MED ORDER — METHYLPREDNISOLONE SODIUM SUCC 125 MG IJ SOLR
125.0000 mg | Freq: Once | INTRAMUSCULAR | Status: AC
  Administered 2011-12-02: 125 mg via INTRAMUSCULAR
  Filled 2011-12-02: qty 2

## 2011-12-02 MED ORDER — PREDNISONE 20 MG PO TABS: 60.0000 mg | ORAL_TABLET | Freq: Every day | ORAL | Status: DC

## 2011-12-02 MED ORDER — ALBUTEROL SULFATE HFA 108 (90 BASE) MCG/ACT IN AERS
2.0000 | INHALATION_SPRAY | RESPIRATORY_TRACT | Status: DC | PRN
  Administered 2011-12-02: 2 via RESPIRATORY_TRACT
  Filled 2011-12-02: qty 6.7

## 2011-12-02 MED ORDER — AZITHROMYCIN 250 MG PO TABS: ORAL_TABLET | ORAL | Status: DC

## 2011-12-02 MED ORDER — ACETAMINOPHEN 325 MG PO TABS
650.0000 mg | ORAL_TABLET | Freq: Once | ORAL | Status: AC
  Administered 2011-12-02: 650 mg via ORAL
  Filled 2011-12-02: qty 2

## 2011-12-02 MED ORDER — ALBUTEROL SULFATE HFA 108 (90 BASE) MCG/ACT IN AERS: 1.0000 | INHALATION_SPRAY | Freq: Four times a day (QID) | RESPIRATORY_TRACT | Status: DC | PRN

## 2011-12-02 NOTE — ED Notes (Signed)
RT paged for albuterol neb

## 2011-12-02 NOTE — ED Provider Notes (Signed)
History     CSN: [id number]  Arrival date & time 12/01/11  2327   First MD Initiated Contact with Patient 12/02/11 0041      Chief Complaint  Patient presents with  . Chest Pain    (Consider location/radiation/quality/duration/timing/severity/associated sxs/prior treatment) HPI HX per PT, sick for the last 2 weeks with cough and congestion and sharp CP with coughing, SOB and wheezing, evaluated here last week and told she has bronchitis, has leg swelling going on for the past 2 -3 weeks. No h/o CHF, no h/o CAD. Has felt feverish, taking advil. No inhaler. HAs not seen PCP for this. No rashes , daughter has simialr symptoms. Mod in severity. Past Medical History  Diagnosis Date  . Bilateral hearing loss   . Otitis media, chronic   . Urinary, incontinence, stress female   . Dyslipidemia   . Allergic rhinitis, seasonal   . Osteoarthritis cervical spine   . Osteoarthritis of hand     bilateral  . Alcohol abuse, in remission   . Depression with anxiety   . Colitis, ischemic 2012  . Sciatica   . Diverticulitis 2012  . Alcohol abuse   . Depression     Past Surgical History  Procedure Date  . D&c x 4   . Btl laproscopic   . Abdominal hysterectomy   . Tonsillectomy   . Excision of tumors at right neck     angle of jaw '68, benign  . Hearing loss     BIL HEARING LOSS  . Otitis   . Urinary incontinence   . Dislipidemia     Family History  Problem Relation Age of Onset  . Diabetes Brother   . Alcohol abuse Brother     x 2  . Drug abuse Mother   . Alcohol abuse Mother   . Alcohol abuse Father   . Colon cancer Paternal Uncle     History  Substance Use Topics  . Smoking status: Never Smoker   . Smokeless tobacco: Never Used  . Alcohol Use: No     alcohol abuse formally; has been abstinent x 3 years    OB History    Grav Para Term Preterm Abortions TAB SAB Ect Mult Living                  Review of Systems  Constitutional: Negative for fever and chills.    HENT: Negative for neck pain and neck stiffness.   Eyes: Negative for pain.  Respiratory: Positive for cough, shortness of breath and wheezing.   Cardiovascular: Positive for chest pain. Negative for palpitations.  Gastrointestinal: Negative for abdominal pain.  Genitourinary: Negative for dysuria.  Musculoskeletal: Negative for back pain.  Skin: Negative for rash.  Neurological: Negative for headaches.  All other systems reviewed and are negative.    Allergies  Codeine; Neurontin; Oxycodone; Procaine hcl; and Sulfur  Home Medications   Current Outpatient Rx  Name Route Sig Dispense Refill  . ARIPIPRAZOLE 2 MG PO TABS Oral Take 2 mg by mouth daily.      Marland Kitchen BENZONATATE 100 MG PO CAPS Oral Take 1 capsule (100 mg total) by mouth every 8 (eight) hours. 21 capsule 0  . BUPRENORPHINE 20 MCG/HR TD PTWK Transdermal Place 20 mcg onto the skin once a week. Saturdays    . CHOLECALCIFEROL 400 UNITS PO TABS Oral Take 400 Units by mouth daily.    Marland Kitchen CLONIDINE HCL 0.1 MG PO TABS Oral Take 0.1 mg by mouth at  bedtime.    Marland Kitchen DIAZEPAM 5 MG PO TABS Oral Take 5 mg by mouth 2 (two) times daily.     Marland Kitchen ESCITALOPRAM OXALATE 10 MG PO TABS Oral Take 20 mg by mouth daily.     Marland Kitchen ESTROGENS CONJ SYNTHETIC A 0.625 MG PO TABS Oral Take 1.25 mg by mouth at bedtime. Pt takes 2 tabs at bedtime    . FLUTICASONE PROPIONATE 50 MCG/ACT NA SUSP Nasal Place 2 sprays into the nose daily. 100 g 2  . IBUPROFEN 200 MG PO TABS Oral Take 800 mg by mouth every 6 (six) hours as needed. For pain/fever    . LACTULOSE 10 GM/15ML PO SOLN  Take 30 ml three times a day as needed for constipation. 500 mL 2  . LEVOTHYROXINE SODIUM 75 MCG PO TABS Oral Take 75 mcg by mouth daily.      Marland Kitchen LITHIUM CARBONATE 300 MG PO CAPS Oral Take 600 mg by mouth at bedtime.     . NYSTATIN 100000 UNIT/GM EX OINT Topical Apply topically 2 (two) times daily. Apply to affected skin 30 g 0  . OMEGA-3-ACID ETHYL ESTERS 1 G PO CAPS Oral Take 2 g by mouth 2 (two)  times daily.    . QUETIAPINE FUMARATE 100 MG PO TABS Oral Take 100 mg by mouth 4 (four) times daily.     Marland Kitchen RANITIDINE HCL 150 MG PO TABS Oral Take 150 mg by mouth 2 (two) times daily.    Marland Kitchen ROPINIROLE HCL 0.5 MG PO TABS Oral Take 0.5 mg by mouth at bedtime.     Marland Kitchen POLYMYXIN B-TRIMETHOPRIM 10000-0.1 UNIT/ML-% OP SOLN Ophthalmic Apply 1 drop to eye every 4 (four) hours. 10 mL 0  . VALACYCLOVIR HCL 500 MG PO TABS Oral Take 1 tablet (500 mg total) by mouth 3 (three) times daily. 21 tablet 0    BP 140/62  Pulse 83  Temp 98.9 F (37.2 C) (Oral)  Resp 20  SpO2 97%  Physical Exam  Constitutional: She is oriented to person, place, and time. She appears well-developed and well-nourished.  HENT:  Head: Normocephalic and atraumatic.  Eyes: Conjunctivae normal and EOM are normal. Pupils are equal, round, and reactive to light.  Neck: Trachea normal. Neck supple. No thyromegaly present.  Cardiovascular: Normal rate, regular rhythm, S1 normal, S2 normal and normal pulses.     No systolic murmur is present   No diastolic murmur is present  Pulses:      Radial pulses are 2+ on the right side, and 2+ on the left side.  Pulmonary/Chest: Effort normal. She has no rhonchi. She exhibits no tenderness.       Mildly coarse bilateral breath sounds and int exp wheezes.   Abdominal: Soft. Normal appearance and bowel sounds are normal. There is no tenderness. There is no CVA tenderness and negative Murphy's sign.  Musculoskeletal:       BLE:s Calves nontender, no cords or erythema, negative Homans sign  Neurological: She is alert and oriented to person, place, and time. She has normal strength. No cranial nerve deficit or sensory deficit. GCS eye subscore is 4. GCS verbal subscore is 5. GCS motor subscore is 6.  Skin: Skin is warm and dry. No rash noted. She is not diaphoretic.  Psychiatric: Her speech is normal.       Cooperative and appropriate    ED Course  Procedures (including critical care  time)  Results for orders placed during the hospital encounter of 12/01/11  CBC  Component Value Range   WBC 7.5  4.0 - 10.5 K/uL   RBC 4.27  3.87 - 5.11 MIL/uL   Hemoglobin 13.0  12.0 - 15.0 g/dL   HCT 16.1  09.6 - 04.5 %   MCV 89.7  78.0 - 100.0 fL   MCH 30.4  26.0 - 34.0 pg   MCHC 33.9  30.0 - 36.0 g/dL   RDW 40.9  81.1 - 91.4 %   Platelets 203  150 - 400 K/uL  POCT I-STAT, CHEM 8      Component Value Range   Sodium 138  135 - 145 mEq/L   Potassium 3.6  3.5 - 5.1 mEq/L   Chloride 106  96 - 112 mEq/L   BUN 10  6 - 23 mg/dL   Creatinine, Ser 7.82  0.50 - 1.10 mg/dL   Glucose, Bld 956 (*) 70 - 99 mg/dL   Calcium, Ion 2.13  0.86 - 1.30 mmol/L   TCO2 23  0 - 100 mmol/L   Hemoglobin 12.6  12.0 - 15.0 g/dL   HCT 57.8  46.9 - 62.9 %   Dg Chest 2 View (if Patient Has Fever And/or Copd)  12/02/2011  *RADIOLOGY REPORT*  Clinical Data: Cough.  Fever.  Headache.  CHEST - 2 VIEW  Comparison: 11/27/2011.  Findings:  Cardiopericardial silhouette within normal limits. Mediastinal contours normal. Trachea midline.  No airspace disease or effusion. Monitoring leads are projected over the chest.  IMPRESSION: Negative two-view chest.  No interval change.   Original Report Authenticated By: Andreas Newport, M.D.      Date: 12/02/2011  Rate: 86  Rhythm: normal sinus rhythm  QRS Axis: normal  Intervals: normal  ST/T Wave abnormalities: nonspecific ST changes  Conduction Disutrbances:none  Narrative Interpretation:   Old EKG Reviewed: none available  Bronchitis/ fevers  Albuterol breathing TX - subj improvement.   Pulse 98 % RA, is adequate MDM   66 yo femaile with 2 weeks of bronchitis symptoms and now fevers and wheezes requiring albuterol, plan ABX and close PCP follow up. stable for d/c home with inhaler provided. RX prednisone  VS and nursing notes reviewed.   Labs. ECG. CXR. medications provided.       Sunnie Nielsen, MD 12/02/11 [phone]

## 2011-12-02 NOTE — ED Notes (Signed)
Patient transported to X-ray 

## 2011-12-08 ENCOUNTER — Encounter: Payer: Self-pay | Admitting: Internal Medicine

## 2011-12-08 ENCOUNTER — Ambulatory Visit (INDEPENDENT_AMBULATORY_CARE_PROVIDER_SITE_OTHER): Payer: BC Managed Care – PPO | Admitting: Internal Medicine

## 2011-12-08 VITALS — BP 114/60 | HR 92 | Temp 98.6°F | Resp 16 | Wt 180.0 lb

## 2011-12-08 DIAGNOSIS — J209 Acute bronchitis, unspecified: Secondary | ICD-10-CM

## 2011-12-08 DIAGNOSIS — J208 Acute bronchitis due to other specified organisms: Secondary | ICD-10-CM

## 2011-12-08 NOTE — Patient Instructions (Addendum)
Reviewed your hospital studies - you had a normal white blood count, normal chemistries, normal chest x-ray. Although antibiotics were prescribed there is not clear evidence that this will help.  On exam - no fever, lungs are perfectly clear, no wheezing, no shortness of breath or increased work of breathing.    For your cough you may take Robitussin or any other cough syrup that you can tolerate, if no cough syrup you can take mucinex 1200 mg twice a day or menthol cough drops, or honey laced tea with lemon. For sweats and fevers you may take generic tylenol 500 mg three times a day and may increase to 1000 mg three times a day if needed. Avoid anti-inflammatory drugs like Advil. Be sure you drink plenty of fluids. Take vitamin C 1500 mg once day. You may want to try ecchincea - an immune system enhancer.  Acute Bronchitis You have acute bronchitis. This means you have a chest cold. The airways in your lungs are red and sore (inflamed). Acute means it is sudden onset.   CAUSES Bronchitis is most often caused by the same virus that causes a cold. SYMPTOMS    Body aches.   Chest congestion.   Chills.   Cough.   Fever.   Shortness of breath.   Sore throat.  TREATMENT   Acute bronchitis is usually treated with rest, fluids, and medicines for relief of fever or cough. Most symptoms should go away after a few days or a week. Increased fluids may help thin your secretions and will prevent dehydration. Your caregiver may give you an inhaler to improve your symptoms. The inhaler reduces shortness of breath and helps control cough. You can take over-the-counter pain relievers or cough medicine to decrease coughing, pain, or fever. A cool-air vaporizer may help thin bronchial secretions and make it easier to clear your chest. Antibiotics are usually not needed but can be prescribed if you smoke, are seriously ill, have chronic lung problems, are elderly, or you are at higher risk for developing  complications. Allergies and asthma can make bronchitis worse. Repeated episodes of bronchitis may cause longstanding lung problems. Avoid smoking and secondhand smoke. Exposure to cigarette smoke or irritating chemicals will make bronchitis worse. If you are a cigarette smoker, consider using nicotine gum or skin patches to help control withdrawal symptoms. Quitting smoking will help your lungs heal faster. Recovery from bronchitis is often slow, but you should start feeling better after 2 to 3 days. Cough from bronchitis frequently lasts for 3 to 4 weeks. To prevent another bout of acute bronchitis:  Quit smoking.   Wash your hands frequently to get rid of viruses or use a hand sanitizer.   Avoid other people with cold or virus symptoms.   Try not to touch your hands to your mouth, nose, or eyes.  SEEK IMMEDIATE MEDICAL CARE IF:  You develop increased fever, chills, or chest pain.   You have severe shortness of breath or bloody sputum.   You develop dehydration, fainting, repeated vomiting, or a severe headache.   You have no improvement after 1 week of treatment or you get worse.  MAKE SURE YOU:    Understand these instructions.   Will watch your condition.   Will get help right away if you are not doing well or get worse.  Document Released: 04/02/2004 Document Revised: 05/18/2011 Document Reviewed: 06/18/2010 [facility] Patient Information 2013 [place], [place].
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
## 2011-12-16 ENCOUNTER — Encounter
Payer: BC Managed Care – PPO | Attending: Physical Medicine and Rehabilitation | Admitting: Physical Medicine and Rehabilitation

## 2011-12-16 ENCOUNTER — Encounter: Payer: Self-pay | Admitting: Physical Medicine and Rehabilitation

## 2011-12-16 VITALS — BP 123/54 | HR 80 | Resp 14 | Ht 62.0 in | Wt 181.0 lb

## 2011-12-16 DIAGNOSIS — M545 Low back pain, unspecified: Secondary | ICD-10-CM

## 2011-12-16 DIAGNOSIS — G8929 Other chronic pain: Secondary | ICD-10-CM | POA: Insufficient documentation

## 2011-12-16 DIAGNOSIS — IMO0001 Reserved for inherently not codable concepts without codable children: Secondary | ICD-10-CM

## 2011-12-16 DIAGNOSIS — L74519 Primary focal hyperhidrosis, unspecified: Secondary | ICD-10-CM | POA: Insufficient documentation

## 2011-12-16 DIAGNOSIS — F319 Bipolar disorder, unspecified: Secondary | ICD-10-CM | POA: Insufficient documentation

## 2011-12-16 MED ORDER — BUPRENORPHINE 20 MCG/HR TD PTWK: 20.0000 ug | MEDICATED_PATCH | TRANSDERMAL | Status: DC

## 2011-12-16 NOTE — Patient Instructions (Signed)
Restart your walking program, try to walk 3-5 times per week, start with 10-15 min. and then slowly increase. Do some stretching exercises for pain relief.
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
## 2012-01-14 ENCOUNTER — Encounter
Payer: BC Managed Care – PPO | Attending: Physical Medicine and Rehabilitation | Admitting: Physical Medicine and Rehabilitation

## 2012-01-14 ENCOUNTER — Encounter: Payer: Self-pay | Admitting: Physical Medicine and Rehabilitation

## 2012-01-14 VITALS — BP 124/70 | HR 88 | Resp 14 | Ht 62.0 in | Wt 180.6 lb

## 2012-01-14 DIAGNOSIS — L74519 Primary focal hyperhidrosis, unspecified: Secondary | ICD-10-CM | POA: Insufficient documentation

## 2012-01-14 DIAGNOSIS — E785 Hyperlipidemia, unspecified: Secondary | ICD-10-CM | POA: Insufficient documentation

## 2012-01-14 DIAGNOSIS — IMO0001 Reserved for inherently not codable concepts without codable children: Secondary | ICD-10-CM | POA: Insufficient documentation

## 2012-01-14 DIAGNOSIS — M545 Low back pain, unspecified: Secondary | ICD-10-CM | POA: Insufficient documentation

## 2012-01-14 DIAGNOSIS — F319 Bipolar disorder, unspecified: Secondary | ICD-10-CM | POA: Insufficient documentation

## 2012-01-14 DIAGNOSIS — Z79899 Other long term (current) drug therapy: Secondary | ICD-10-CM | POA: Insufficient documentation

## 2012-01-14 MED ORDER — BUPRENORPHINE 20 MCG/HR TD PTWK: 20.0000 ug | MEDICATED_PATCH | TRANSDERMAL | Status: DC

## 2012-01-14 NOTE — Patient Instructions (Signed)
Try to start a walking program or start an exercises program regularly.
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
## 2012-02-10 ENCOUNTER — Emergency Department (HOSPITAL_COMMUNITY)
Admission: EM | Admit: 2012-02-10 | Discharge: 2012-02-10 | Disposition: A | Discharge status: Home / Self Care | Payer: BC Managed Care – PPO | Attending: Emergency Medicine | Admitting: Emergency Medicine

## 2012-02-10 ENCOUNTER — Other Ambulatory Visit: Payer: Self-pay

## 2012-02-10 ENCOUNTER — Encounter (HOSPITAL_COMMUNITY): Payer: Self-pay | Admitting: Emergency Medicine

## 2012-02-10 ENCOUNTER — Telehealth: Payer: Self-pay | Admitting: Internal Medicine

## 2012-02-10 DIAGNOSIS — R42 Dizziness and giddiness: Secondary | ICD-10-CM | POA: Insufficient documentation

## 2012-02-10 DIAGNOSIS — Z8739 Personal history of other diseases of the musculoskeletal system and connective tissue: Secondary | ICD-10-CM | POA: Insufficient documentation

## 2012-02-10 DIAGNOSIS — R4182 Altered mental status, unspecified: Secondary | ICD-10-CM

## 2012-02-10 DIAGNOSIS — H9190 Unspecified hearing loss, unspecified ear: Secondary | ICD-10-CM | POA: Insufficient documentation

## 2012-02-10 DIAGNOSIS — Z8719 Personal history of other diseases of the digestive system: Secondary | ICD-10-CM | POA: Insufficient documentation

## 2012-02-10 DIAGNOSIS — H6690 Otitis media, unspecified, unspecified ear: Secondary | ICD-10-CM | POA: Insufficient documentation

## 2012-02-10 DIAGNOSIS — Z862 Personal history of diseases of the blood and blood-forming organs and certain disorders involving the immune mechanism: Secondary | ICD-10-CM | POA: Insufficient documentation

## 2012-02-10 DIAGNOSIS — Z8679 Personal history of other diseases of the circulatory system: Secondary | ICD-10-CM | POA: Insufficient documentation

## 2012-02-10 DIAGNOSIS — Z79899 Other long term (current) drug therapy: Secondary | ICD-10-CM | POA: Insufficient documentation

## 2012-02-10 DIAGNOSIS — F329 Major depressive disorder, single episode, unspecified: Secondary | ICD-10-CM | POA: Insufficient documentation

## 2012-02-10 DIAGNOSIS — Z8639 Personal history of other endocrine, nutritional and metabolic disease: Secondary | ICD-10-CM | POA: Insufficient documentation

## 2012-02-10 DIAGNOSIS — F1010 Alcohol abuse, uncomplicated: Secondary | ICD-10-CM | POA: Insufficient documentation

## 2012-02-10 DIAGNOSIS — M19049 Primary osteoarthritis, unspecified hand: Secondary | ICD-10-CM | POA: Insufficient documentation

## 2012-02-10 DIAGNOSIS — F411 Generalized anxiety disorder: Secondary | ICD-10-CM | POA: Insufficient documentation

## 2012-02-10 DIAGNOSIS — M47812 Spondylosis without myelopathy or radiculopathy, cervical region: Secondary | ICD-10-CM | POA: Insufficient documentation

## 2012-02-10 DIAGNOSIS — Z87448 Personal history of other diseases of urinary system: Secondary | ICD-10-CM | POA: Insufficient documentation

## 2012-02-10 DIAGNOSIS — R443 Hallucinations, unspecified: Secondary | ICD-10-CM | POA: Insufficient documentation

## 2012-02-10 DIAGNOSIS — Z8709 Personal history of other diseases of the respiratory system: Secondary | ICD-10-CM | POA: Insufficient documentation

## 2012-02-10 DIAGNOSIS — F3289 Other specified depressive episodes: Secondary | ICD-10-CM | POA: Insufficient documentation

## 2012-02-10 LAB — ETHANOL: Alcohol, Ethyl (B): <11 mg/dL (ref 0–11)

## 2012-02-10 LAB — CBC WITH DIFFERENTIAL/PLATELET
Basophils Absolute: 0 10*3/uL (ref 0.0–0.1)
Basophils Relative: 0 % (ref 0–1)
Eosinophils Absolute: 0.5 10*3/uL (ref 0.0–0.7)
Eosinophils Relative: 7 % — ABNORMAL HIGH (ref 0–5)
HCT: 36.6 % (ref 36.0–46.0)
Hemoglobin: 12.5 g/dL (ref 12.0–15.0)
Lymphocytes Relative: 22 % (ref 12–46)
Lymphs Abs: 1.6 10*3/uL (ref 0.7–4.0)
MCH: 30.3 pg (ref 26.0–34.0)
MCHC: 34.2 g/dL (ref 30.0–36.0)
MCV: 88.6 fL (ref 78.0–100.0)
Monocytes Absolute: 0.5 10*3/uL (ref 0.1–1.0)
Monocytes Relative: 6 % (ref 3–12)
Neutro Abs: 4.6 10*3/uL (ref 1.7–7.7)
Neutrophils Relative %: 64 % (ref 43–77)
Platelets: 186 10*3/uL (ref 150–400)
RBC: 4.13 MIL/uL (ref 3.87–5.11)
RDW: 12.9 % (ref 11.5–15.5)
WBC: 7.2 10*3/uL (ref 4.0–10.5)

## 2012-02-10 LAB — COMPREHENSIVE METABOLIC PANEL
ALT: 18 U/L (ref 0–35)
AST: 25 U/L (ref 0–37)
Albumin: 3.2 g/dL — ABNORMAL LOW (ref 3.5–5.2)
Alkaline Phosphatase: 88 U/L (ref 39–117)
BUN: 10 mg/dL (ref 6–23)
CO2: 24 mEq/L (ref 19–32)
Calcium: 9.3 mg/dL (ref 8.4–10.5)
Chloride: 103 mEq/L (ref 96–112)
Creatinine, Ser: 0.9 mg/dL (ref 0.50–1.10)
GFR calc Af Amer: 77 mL/min — ABNORMAL LOW (ref >90)
GFR calc non Af Amer: 66 mL/min — ABNORMAL LOW (ref >90)
Glucose, Bld: 112 mg/dL — ABNORMAL HIGH (ref 70–99)
Potassium: 3.9 mEq/L (ref 3.5–5.1)
Sodium: 137 mEq/L (ref 135–145)
Total Bilirubin: 0.3 mg/dL (ref 0.3–1.2)
Total Protein: 6.1 g/dL (ref 6.0–8.3)

## 2012-02-10 LAB — RAPID URINE DRUG SCREEN, HOSP PERFORMED
Amphetamines: NOT DETECTED
Barbiturates: NOT DETECTED
Benzodiazepines: POSITIVE — AB
Cocaine: NOT DETECTED
Opiates: NOT DETECTED
Tetrahydrocannabinol: NOT DETECTED

## 2012-02-10 LAB — URINALYSIS, ROUTINE W REFLEX MICROSCOPIC
Bilirubin Urine: NEGATIVE
Glucose, UA: NEGATIVE mg/dL
Hgb urine dipstick: NEGATIVE
Ketones, ur: NEGATIVE mg/dL
Leukocytes, UA: NEGATIVE
Nitrite: NEGATIVE
Protein, ur: NEGATIVE mg/dL
Specific Gravity, Urine: 1.012 (ref 1.005–1.030)
Urobilinogen, UA: 0.2 mg/dL (ref 0.0–1.0)
pH: 6 (ref 5.0–8.0)

## 2012-02-10 MED ORDER — SODIUM CHLORIDE 0.9 % IV BOLUS (SEPSIS)
1000.0000 mL | Freq: Once | INTRAVENOUS | Status: AC
  Administered 2012-02-10: 1000 mL via INTRAVENOUS

## 2012-02-10 NOTE — Telephone Encounter (Signed)
Patient Information:  Caller Name: Vittoria  Phone: [phone]  Patient: [name] [name], [name] [name]  Gender: Female  DOB: April 26, 1945  Age: 66 Years  PCP: Illene Regulus (Adults only)   Symptoms  Reason For Call & Symptoms: Called because she believes she is confused and hallucinating  Reviewed Health History In EMR: N/A  Reviewed Medications In EMR: N/A  Reviewed Allergies In EMR: N/A  Reviewed Surgeries / Procedures: N/A  Date of Onset of Symptoms: 02/10/2012  Guideline(s) Used:  Confusion - Delirium  Disposition Per Guideline:   Go to ED Now  Reason For Disposition Reached:   Bizarre or paranoid behavior  Advice Given:  N/A  Office Follow Up:  Does the office need to follow up with this patient?: Yes  Instructions For The Office: Be aware was sent to ER due to complaints of confusions and hallucinations.  RN Note: Call came in as emergent with complaints of confusion and hallucinations. Is falling a sleep in the middle of what she is doing or be talking to someone and go off topic to a subject not related.  Denies overdose, feelings of suicide or homicide, or head injury. Numbness with left arm and hand since yesterday.  Some shortness of breath but believes is due to cold. Patient is able to speak clearly and communicate thoughts and ideas appropriately.  Is willing to go to ED for evaluation and has called a friend who has seen her behavior to take her. States she feels better now that friend is coming to take her to ER.

## 2012-02-10 NOTE — Telephone Encounter (Signed)
Called regarding shortness of breath; Advised by call screener to call 911 for symptoms.  No answer at time of call back.

## 2012-02-10 NOTE — ED Notes (Signed)
Pt states that she has been dizzy, having auditory hallucinations "sounds like someone is talking to me but no one is there", altered mental status, "ill be talking to my friend and lose train of thought", pt also c/o of cold like symptoms and stomach "really upset". Diarrhea and constipation and "now really hurts"

## 2012-02-10 NOTE — Telephone Encounter (Signed)
Please read call-a-nurse note below.  

## 2012-02-10 NOTE — ED Provider Notes (Signed)
History     CSN: [id number]  Arrival date & time 02/10/12  1528   First MD Initiated Contact with Patient 02/10/12 1733      Chief Complaint  Patient presents with  . Dizziness  . auditory hallucinations   . Altered Mental Status    (Consider location/radiation/quality/duration/timing/severity/associated sxs/prior treatment) HPI Comments: Patient presents here with "not feeling well" since yesterday.  Her mouth feels dry and she is weak.  At home, she feels as if someone is talking to her when there is no-one there and she is seeing things.  She has a history of bipolar but denies any recent medication changes.  No fevers or chills.  She feels as though she is dehydrated.  Patient is a 66 y.o. female presenting with altered mental status. The history is provided by the patient.  Altered Mental Status This is a new problem. The current episode started yesterday. The problem occurs constantly. The problem has been gradually worsening. Pertinent negatives include no chest pain and no shortness of breath. Nothing aggravates the symptoms. Nothing relieves the symptoms. She has tried nothing for the symptoms.    Past Medical History  Diagnosis Date  . Bilateral hearing loss   . Otitis media, chronic   . Urinary, incontinence, stress female   . Dyslipidemia   . Allergic rhinitis, seasonal   . Osteoarthritis cervical spine   . Osteoarthritis of hand     bilateral  . Alcohol abuse, in remission   . Depression with anxiety   . Colitis, ischemic 2012  . Sciatica   . Diverticulitis 2012  . Alcohol abuse   . Depression     Past Surgical History  Procedure Date  . D&c x 4   . Btl laproscopic   . Abdominal hysterectomy   . Tonsillectomy   . Excision of tumors at right neck     angle of jaw '68, benign  . Hearing loss     BIL HEARING LOSS  . Otitis   . Urinary incontinence   . Dislipidemia     Family History  Problem Relation Age of Onset  . Diabetes Brother   . Alcohol  abuse Brother     x 2  . Drug abuse Mother   . Alcohol abuse Mother   . Alcohol abuse Father   . Colon cancer Paternal Uncle     History  Substance Use Topics  . Smoking status: Never Smoker   . Smokeless tobacco: Never Used  . Alcohol Use: No     Comment: alcohol abuse formally; has been abstinent x 3 years    OB History    Grav Para Term Preterm Abortions TAB SAB Ect Mult Living                  Review of Systems  Respiratory: Negative for shortness of breath.   Cardiovascular: Negative for chest pain.  Psychiatric/Behavioral: Positive for altered mental status.  All other systems reviewed and are negative.    Allergies  Azithromycin; Erythromycin; Codeine; Neurontin; Oxycodone; Procaine hcl; and Sulfur  Home Medications   Current Outpatient Rx  Name  Route  Sig  Dispense  Refill  . ALBUTEROL SULFATE HFA 108 (90 BASE) MCG/ACT IN AERS   Inhalation   Inhale 1-2 puffs into the lungs every 6 (six) hours as needed for wheezing.   1 Inhaler   0   . ARIPIPRAZOLE 2 MG PO TABS   Oral   Take 2 mg by mouth daily.           Marland Kitchen  BUPRENORPHINE 20 MCG/HR TD PTWK   Transdermal   Place 1 patch (20 mcg total) onto the skin once a week. Saturdays   4 patch   0   . CLONIDINE HCL 0.1 MG PO TABS   Oral   Take 0.1 mg by mouth at bedtime.         Marland Kitchen DIAZEPAM 5 MG PO TABS   Oral   Take 5 mg by mouth 2 (two) times daily.          Marland Kitchen ESCITALOPRAM OXALATE 10 MG PO TABS   Oral   Take 20 mg by mouth daily.          Marland Kitchen FLUTICASONE PROPIONATE 50 MCG/ACT NA SUSP   Nasal   Place 2 sprays into the nose daily.   100 g   2   . IBUPROFEN 200 MG PO TABS   Oral   Take 800 mg by mouth every 6 (six) hours as needed. For pain/fever         . LACTULOSE 10 GM/15ML PO SOLN      Take 30 ml three times a day as needed for constipation.   500 mL   2   . LEVOTHYROXINE SODIUM 75 MCG PO TABS   Oral   Take 75 mcg by mouth daily.           Marland Kitchen LITHIUM CARBONATE 300 MG PO CAPS    Oral   Take 600 mg by mouth at bedtime.          Marland Kitchen MINIVELLE 0.075 MG/24HR TD PTTW   Transdermal   Place 1 patch onto the skin 2 (two) times a week.         Marland Kitchen QUETIAPINE FUMARATE 100 MG PO TABS   Oral   Take 100 mg by mouth 4 (four) times daily.          Marland Kitchen ROPINIROLE HCL 0.5 MG PO TABS   Oral   Take 0.5 mg by mouth at bedtime.            BP 123/75  Pulse 77  Temp 98.6 F (37 C) (Oral)  Resp 18  SpO2 94%  Physical Exam  Nursing note and vitals reviewed. Constitutional: She is oriented to person, place, and time. She appears well-developed and well-nourished. No distress.  HENT:  Head: Normocephalic and atraumatic.  Neck: Normal range of motion. Neck supple.  Cardiovascular: Normal rate and regular rhythm.  Exam reveals no gallop and no friction rub.   No murmur heard. Pulmonary/Chest: Effort normal and breath sounds normal. No respiratory distress. She has no wheezes.  Abdominal: Soft. Bowel sounds are normal. She exhibits no distension. There is no tenderness.  Musculoskeletal: Normal range of motion.  Neurological: She is alert and oriented to person, place, and time.  Skin: Skin is warm and dry. She is not diaphoretic.    ED Course  Procedures (including critical care time)   Labs Reviewed  CBC WITH DIFFERENTIAL  COMPREHENSIVE METABOLIC PANEL  URINALYSIS, ROUTINE W REFLEX MICROSCOPIC  URINE RAPID DRUG SCREEN (HOSP PERFORMED)  ETHANOL   No results found.   No diagnosis found.    MDM  The patient presents here with confusion, hallucinations, and difficulty focusing.  I have performed what I feel is an appropriate workup into this but have found no reason for it.  She was hydrated and appears well.  She is alert and oriented and appears quite well.  She is not suicidal or homicidal and I believe  she is appropriate for discharge.          Geoffery Lyons, MD 02/10/12 2322

## 2012-02-11 ENCOUNTER — Encounter: Payer: BC Managed Care – PPO | Admitting: Physical Medicine and Rehabilitation

## 2012-02-12 ENCOUNTER — Encounter: Payer: Self-pay | Admitting: Physical Medicine & Rehabilitation

## 2012-02-12 ENCOUNTER — Encounter: Payer: BC Managed Care – PPO | Attending: Physical Medicine and Rehabilitation

## 2012-02-12 ENCOUNTER — Telehealth: Payer: Self-pay | Admitting: *Deleted

## 2012-02-12 ENCOUNTER — Ambulatory Visit (HOSPITAL_BASED_OUTPATIENT_CLINIC_OR_DEPARTMENT_OTHER): Payer: BC Managed Care – PPO | Admitting: Physical Medicine & Rehabilitation

## 2012-02-12 VITALS — BP 120/71 | HR 80 | Resp 14 | Ht 62.0 in | Wt 187.2 lb

## 2012-02-12 DIAGNOSIS — F319 Bipolar disorder, unspecified: Secondary | ICD-10-CM | POA: Insufficient documentation

## 2012-02-12 DIAGNOSIS — IMO0001 Reserved for inherently not codable concepts without codable children: Secondary | ICD-10-CM

## 2012-02-12 DIAGNOSIS — M797 Fibromyalgia: Secondary | ICD-10-CM

## 2012-02-12 DIAGNOSIS — R5381 Other malaise: Secondary | ICD-10-CM

## 2012-02-12 DIAGNOSIS — E785 Hyperlipidemia, unspecified: Secondary | ICD-10-CM | POA: Insufficient documentation

## 2012-02-12 DIAGNOSIS — R5383 Other fatigue: Secondary | ICD-10-CM

## 2012-02-12 DIAGNOSIS — M545 Low back pain, unspecified: Secondary | ICD-10-CM | POA: Insufficient documentation

## 2012-02-12 MED ORDER — BUPRENORPHINE 10 MCG/HR TD PTWK: 10.0000 ug | MEDICATED_PATCH | TRANSDERMAL | Status: DC

## 2012-02-12 MED ORDER — BUPRENORPHINE 20 MCG/HR TD PTWK: 10.0000 ug | MEDICATED_PATCH | TRANSDERMAL | Status: DC

## 2012-02-12 NOTE — Telephone Encounter (Signed)
Received call from patient stating she has been very disoriented and out of it. She has been falling asleep easily during the day. Also complained of sweating severely during the day and night. Went to the ER. Everything checked out ok. Called and spoke with her Psychiatrist to see if any of the meds they were prescribing could be causing this. He advised her that it could be her Butran patch causing this. Patient would like to be evaluated by Dr. Wynn Banker. Appointment was scheduled for patient today at 10:30.

## 2012-02-12 NOTE — Patient Instructions (Signed)
You were poor energy and confusion is most likely related to the interaction of your psychiatric medications and pain medication. We will reduce your pain medicine to one half of the usual dose for you and see you in 2 weeks. At that time we may need to reduce you further on the dose of pain medicine. I recommend no driving.
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
## 2012-02-15 ENCOUNTER — Encounter: Payer: BC Managed Care – PPO | Admitting: Physical Medicine and Rehabilitation

## 2012-02-29 ENCOUNTER — Ambulatory Visit: Payer: BC Managed Care – PPO | Admitting: Physical Medicine & Rehabilitation

## 2012-03-21 ENCOUNTER — Ambulatory Visit (HOSPITAL_BASED_OUTPATIENT_CLINIC_OR_DEPARTMENT_OTHER): Payer: BC Managed Care – PPO | Admitting: Physical Medicine & Rehabilitation

## 2012-03-21 ENCOUNTER — Encounter: Payer: BC Managed Care – PPO | Attending: Physical Medicine and Rehabilitation

## 2012-03-21 ENCOUNTER — Encounter: Payer: Self-pay | Admitting: Physical Medicine & Rehabilitation

## 2012-03-21 VITALS — BP 147/79 | HR 94 | Resp 14 | Ht 62.0 in | Wt 179.6 lb

## 2012-03-21 DIAGNOSIS — F319 Bipolar disorder, unspecified: Secondary | ICD-10-CM | POA: Insufficient documentation

## 2012-03-21 DIAGNOSIS — IMO0001 Reserved for inherently not codable concepts without codable children: Secondary | ICD-10-CM

## 2012-03-21 DIAGNOSIS — E785 Hyperlipidemia, unspecified: Secondary | ICD-10-CM | POA: Insufficient documentation

## 2012-03-21 DIAGNOSIS — M545 Low back pain, unspecified: Secondary | ICD-10-CM | POA: Insufficient documentation

## 2012-03-21 MED ORDER — BUPRENORPHINE 10 MCG/HR TD PTWK: 10.0000 ug | MEDICATED_PATCH | TRANSDERMAL | Status: DC

## 2012-03-21 NOTE — Patient Instructions (Signed)
Continue with the Butrans 10 mcg patch change every week See my PA Clydie Braun in 3 months Aquatic therapy fibromyalgia program at [facility] 10 minutes 3 times per day
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
## 2012-05-09 ENCOUNTER — Telehealth: Payer: Self-pay

## 2012-05-09 NOTE — Telephone Encounter (Signed)
I can't think of anything else that would be appropriate for her case.  I would recommend light exercise , such as walking with stretching.  Acupuncture my be an option as well.

## 2012-05-09 NOTE — Telephone Encounter (Signed)
Left message for patient advising her to continue what she is doing and possibly light exercise, such as walking with stretching.  Also advised her about acupuncture.

## 2012-05-09 NOTE — Telephone Encounter (Signed)
Patient says she has been in a lot of pain over the weekend.  She had tried ibuprofen, advil, aleve, and tylenol but has had no releif.  She would like something for pain.  Please advise.

## 2012-05-18 ENCOUNTER — Telehealth: Payer: Self-pay | Admitting: *Deleted

## 2012-05-18 NOTE — Telephone Encounter (Signed)
Calling to inquire about the possibility of accupuncture.

## 2012-05-18 NOTE — Telephone Encounter (Signed)
We can schedule for acupuncture consultation/trial

## 2012-05-23 ENCOUNTER — Encounter: Payer: Self-pay | Admitting: Physical Medicine & Rehabilitation

## 2012-05-23 ENCOUNTER — Encounter: Payer: BC Managed Care – PPO | Attending: Physical Medicine and Rehabilitation

## 2012-05-23 ENCOUNTER — Ambulatory Visit (HOSPITAL_BASED_OUTPATIENT_CLINIC_OR_DEPARTMENT_OTHER): Payer: BC Managed Care – PPO | Admitting: Physical Medicine & Rehabilitation

## 2012-05-23 VITALS — BP 130/90 | HR 93 | Resp 14 | Ht 62.0 in | Wt 180.2 lb

## 2012-05-23 DIAGNOSIS — M545 Low back pain, unspecified: Secondary | ICD-10-CM | POA: Insufficient documentation

## 2012-05-23 DIAGNOSIS — IMO0001 Reserved for inherently not codable concepts without codable children: Secondary | ICD-10-CM | POA: Insufficient documentation

## 2012-05-23 DIAGNOSIS — E785 Hyperlipidemia, unspecified: Secondary | ICD-10-CM | POA: Insufficient documentation

## 2012-05-23 DIAGNOSIS — F319 Bipolar disorder, unspecified: Secondary | ICD-10-CM | POA: Insufficient documentation

## 2012-05-23 MED ORDER — BUPRENORPHINE 10 MCG/HR TD PTWK: 10.0000 ug | MEDICATED_PATCH | TRANSDERMAL | Status: DC

## 2012-05-23 NOTE — Patient Instructions (Addendum)
Please check insurance benefits for acupuncture.  [facility] [phone]
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
## 2012-06-15 ENCOUNTER — Ambulatory Visit: Payer: BC Managed Care – PPO | Admitting: Physical Medicine and Rehabilitation

## 2012-06-16 ENCOUNTER — Encounter: Payer: Self-pay | Admitting: Internal Medicine

## 2012-06-16 ENCOUNTER — Other Ambulatory Visit: Payer: BC Managed Care – PPO

## 2012-06-16 ENCOUNTER — Ambulatory Visit (INDEPENDENT_AMBULATORY_CARE_PROVIDER_SITE_OTHER): Payer: BC Managed Care – PPO | Admitting: Internal Medicine

## 2012-06-16 VITALS — BP 148/90 | HR 98 | Temp 97.9°F | Resp 12 | Ht 62.0 in | Wt 176.0 lb

## 2012-06-16 DIAGNOSIS — R232 Flushing: Secondary | ICD-10-CM

## 2012-06-16 DIAGNOSIS — R7309 Other abnormal glucose: Secondary | ICD-10-CM

## 2012-06-16 DIAGNOSIS — R000 Tachycardia, unspecified: Secondary | ICD-10-CM

## 2012-06-16 DIAGNOSIS — N760 Acute vaginitis: Secondary | ICD-10-CM

## 2012-06-16 DIAGNOSIS — R739 Hyperglycemia, unspecified: Secondary | ICD-10-CM

## 2012-06-16 MED ORDER — FLUCONAZOLE 150 MG PO TABS: 150.0000 mg | ORAL_TABLET | Freq: Once | ORAL | Status: DC

## 2012-06-16 MED ORDER — HYOSCYAMINE SULFATE 0.125 MG SL SUBL: 0.1250 mg | SUBLINGUAL_TABLET | SUBLINGUAL | Status: DC | PRN
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
## 2012-06-16 NOTE — Patient Instructions (Addendum)
1. Abdominal pain with bloating, gas, loud bowel sounds, burping and variable bowel habit sounds like Irritable bowel syndrome, IBS Plan See handout: high fiber diet, bulk laxative like Benefiber and for acute cramping/bloating pain Hyoscamine 0.125 mg SL  2. Irregular heart beat - normal on exam Plan 48 hour heart monitor to check for irregular rhythms. You will be called for appointment  3. Flushing, sweats -  Plan  24 hour urine study to check on the adrenal gland function.   4. For yeast infection  Diflucan 150mg  taken just once.   Irritable Bowel Syndrome Irritable Bowel Syndrome (IBS) is caused by a disturbance of normal bowel function. Other terms used are spastic colon, mucous colitis, and irritable colon. It does not require surgery, nor does it lead to cancer. There is no cure for IBS. But with proper diet, stress reduction, and medication, you will find that your problems (symptoms) will gradually disappear or improve. IBS is a common digestive disorder. It usually appears in late adolescence or early adulthood. Women develop it twice as often as men. CAUSES  After food has been digested and absorbed in the small intestine, waste material is moved into the colon (large intestine). In the colon, water and salts are absorbed from the undigested products coming from the small intestine. The remaining residue, or fecal material, is held for elimination. Under normal circumstances, gentle, rhythmic contractions on the bowel walls push the fecal material along the colon towards the rectum. In IBS, however, these contractions are irregular and poorly coordinated. The fecal material is either retained too long, resulting in constipation, or expelled too soon, producing diarrhea. SYMPTOMS  The most common symptom of IBS is pain. It is typically in the lower left side of the belly (abdomen). But it may occur anywhere in the abdomen. It can be felt as heartburn, backache, or even as a dull pain in  the arms or shoulders. The pain comes from excessive bowel-muscle spasms and from the buildup of gas and fecal material in the colon. This pain:  Can range from sharp belly (abdominal) cramps to a dull, continuous ache.  Usually worsens soon after eating.  Is typically relieved by having a bowel movement or passing gas. Abdominal pain is usually accompanied by constipation. But it may also produce diarrhea. The diarrhea typically occurs right after a meal or upon arising in the morning. The stools are typically soft and watery. They are often flecked with secretions (mucus). Other symptoms of IBS include:  Bloating.  Loss of appetite.  Heartburn.  Feeling sick to your stomach (nausea).  Belching  Vomiting  Gas. IBS may also cause a number of symptoms that are unrelated to the digestive system:  Fatigue.  Headaches.  Anxiety  Shortness of breath  Difficulty in concentrating.  Dizziness. These symptoms tend to come and go. DIAGNOSIS  The symptoms of IBS closely mimic the symptoms of other, more serious digestive disorders. So your caregiver may wish to perform a variety of additional tests to exclude these disorders. He/she wants to be certain of learning what is wrong (diagnosis). The nature and purpose of each test will be explained to you. TREATMENT A number of medications are available to help correct bowel function and/or relieve bowel spasms and abdominal pain. Among the drugs available are:  Mild, non-irritating laxatives for severe constipation and to help restore normal bowel habits.  Specific anti-diarrheal medications to treat severe or prolonged diarrhea.  Anti-spasmodic agents to relieve intestinal cramps.  Your caregiver may  also decide to treat you with a mild tranquilizer or sedative during unusually stressful periods in your life. The important thing to remember is that if any drug is prescribed for you, make sure that you take it exactly as directed.  Make sure that your caregiver knows how well it worked for you. HOME CARE INSTRUCTIONS   Avoid foods that are high in fat or oils. Some examples OZH:[id number] cream, butter, frankfurters, sausage, and other fatty meats.  Avoid foods that have a laxative effect, such as fruit, fruit juice, and dairy products.  Cut out carbonated drinks, chewing gum, and "gassy" foods, such as beans and cabbage. This may help relieve bloating and belching.  Bran taken with plenty of liquids may help relieve constipation.  Keep track of what foods seem to trigger your symptoms.  Avoid emotionally charged situations or circumstances that produce anxiety.  Start or continue exercising.  Get plenty of rest and sleep. MAKE SURE YOU:   Understand these instructions.  Will watch your condition.  Will get help right away if you are not doing well or get worse. Document Released: 02/23/2005 Document Revised: 05/18/2011 Document Reviewed: 10/14/2007 [facility] Patient Information 2013 [place], [place].

## 2012-06-18 ENCOUNTER — Emergency Department (HOSPITAL_COMMUNITY): Payer: BC Managed Care – PPO

## 2012-06-18 ENCOUNTER — Inpatient Hospital Stay (HOSPITAL_COMMUNITY)
Admission: EM | Admit: 2012-06-18 | Discharge: 2012-06-24 | DRG: 814 | Disposition: A | Discharge status: Home Health | Payer: BC Managed Care – PPO | Attending: Internal Medicine | Admitting: Internal Medicine

## 2012-06-18 ENCOUNTER — Encounter (HOSPITAL_COMMUNITY): Payer: Self-pay | Admitting: *Deleted

## 2012-06-18 DIAGNOSIS — I10 Essential (primary) hypertension: Secondary | ICD-10-CM | POA: Diagnosis present

## 2012-06-18 DIAGNOSIS — K760 Fatty (change of) liver, not elsewhere classified: Secondary | ICD-10-CM

## 2012-06-18 DIAGNOSIS — N393 Stress incontinence (female) (male): Secondary | ICD-10-CM

## 2012-06-18 DIAGNOSIS — H6690 Otitis media, unspecified, unspecified ear: Secondary | ICD-10-CM

## 2012-06-18 DIAGNOSIS — Y849 Medical procedure, unspecified as the cause of abnormal reaction of the patient, or of later complication, without mention of misadventure at the time of the procedure: Secondary | ICD-10-CM | POA: Diagnosis not present

## 2012-06-18 DIAGNOSIS — T888XXA Other specified complications of surgical and medical care, not elsewhere classified, initial encounter: Secondary | ICD-10-CM | POA: Diagnosis not present

## 2012-06-18 DIAGNOSIS — M545 Low back pain, unspecified: Secondary | ICD-10-CM

## 2012-06-18 DIAGNOSIS — M1611 Unilateral primary osteoarthritis, right hip: Secondary | ICD-10-CM

## 2012-06-18 DIAGNOSIS — N760 Acute vaginitis: Secondary | ICD-10-CM | POA: Diagnosis present

## 2012-06-18 DIAGNOSIS — E785 Hyperlipidemia, unspecified: Secondary | ICD-10-CM

## 2012-06-18 DIAGNOSIS — K55039 Acute (reversible) ischemia of large intestine, extent unspecified: Secondary | ICD-10-CM

## 2012-06-18 DIAGNOSIS — R739 Hyperglycemia, unspecified: Secondary | ICD-10-CM

## 2012-06-18 DIAGNOSIS — E1140 Type 2 diabetes mellitus with diabetic neuropathy, unspecified: Secondary | ICD-10-CM | POA: Insufficient documentation

## 2012-06-18 DIAGNOSIS — J309 Allergic rhinitis, unspecified: Secondary | ICD-10-CM

## 2012-06-18 DIAGNOSIS — F341 Dysthymic disorder: Secondary | ICD-10-CM

## 2012-06-18 DIAGNOSIS — K529 Noninfective gastroenteritis and colitis, unspecified: Secondary | ICD-10-CM

## 2012-06-18 DIAGNOSIS — M797 Fibromyalgia: Secondary | ICD-10-CM

## 2012-06-18 DIAGNOSIS — F1011 Alcohol abuse, in remission: Secondary | ICD-10-CM

## 2012-06-18 DIAGNOSIS — R7309 Other abnormal glucose: Secondary | ICD-10-CM

## 2012-06-18 DIAGNOSIS — H9190 Unspecified hearing loss, unspecified ear: Secondary | ICD-10-CM

## 2012-06-18 DIAGNOSIS — M479 Spondylosis, unspecified: Secondary | ICD-10-CM

## 2012-06-18 DIAGNOSIS — M19049 Primary osteoarthritis, unspecified hand: Secondary | ICD-10-CM

## 2012-06-18 DIAGNOSIS — F319 Bipolar disorder, unspecified: Secondary | ICD-10-CM | POA: Diagnosis present

## 2012-06-18 DIAGNOSIS — E1159 Type 2 diabetes mellitus with other circulatory complications: Secondary | ICD-10-CM | POA: Diagnosis present

## 2012-06-18 DIAGNOSIS — K5289 Other specified noninfective gastroenteritis and colitis: Principal | ICD-10-CM | POA: Diagnosis present

## 2012-06-18 DIAGNOSIS — E039 Hypothyroidism, unspecified: Secondary | ICD-10-CM | POA: Diagnosis present

## 2012-06-18 DIAGNOSIS — M7061 Trochanteric bursitis, right hip: Secondary | ICD-10-CM

## 2012-06-18 DIAGNOSIS — E119 Type 2 diabetes mellitus without complications: Secondary | ICD-10-CM | POA: Diagnosis present

## 2012-06-18 DIAGNOSIS — IMO0001 Reserved for inherently not codable concepts without codable children: Secondary | ICD-10-CM

## 2012-06-18 HISTORY — DX: Fatty (change of) liver, not elsewhere classified: K76.0

## 2012-06-18 LAB — CBC WITH DIFFERENTIAL/PLATELET
Basophils Absolute: 0 10*3/uL (ref 0.0–0.1)
Basophils Relative: 0 % (ref 0–1)
Eosinophils Absolute: 0.3 10*3/uL (ref 0.0–0.7)
Eosinophils Relative: 3 % (ref 0–5)
HCT: 44.9 % (ref 36.0–46.0)
Hemoglobin: 15.2 g/dL — ABNORMAL HIGH (ref 12.0–15.0)
Lymphocytes Relative: 16 % (ref 12–46)
Lymphs Abs: 1.5 10*3/uL (ref 0.7–4.0)
MCH: 29.7 pg (ref 26.0–34.0)
MCHC: 33.9 g/dL (ref 30.0–36.0)
MCV: 87.7 fL (ref 78.0–100.0)
Monocytes Absolute: 0.4 10*3/uL (ref 0.1–1.0)
Monocytes Relative: 5 % (ref 3–12)
Neutro Abs: 7.1 10*3/uL (ref 1.7–7.7)
Neutrophils Relative %: 76 % (ref 43–77)
Platelets: 185 10*3/uL (ref 150–400)
RBC: 5.12 MIL/uL — ABNORMAL HIGH (ref 3.87–5.11)
RDW: 12.5 % (ref 11.5–15.5)
WBC: 9.3 10*3/uL (ref 4.0–10.5)

## 2012-06-18 LAB — COMPREHENSIVE METABOLIC PANEL
ALT: 53 U/L — ABNORMAL HIGH (ref 0–35)
AST: 43 U/L — ABNORMAL HIGH (ref 0–37)
Albumin: 3.4 g/dL — ABNORMAL LOW (ref 3.5–5.2)
Alkaline Phosphatase: 179 U/L — ABNORMAL HIGH (ref 39–117)
BUN: 12 mg/dL (ref 6–23)
CO2: 24 mEq/L (ref 19–32)
Calcium: 10 mg/dL (ref 8.4–10.5)
Chloride: 92 mEq/L — ABNORMAL LOW (ref 96–112)
Creatinine, Ser: 0.94 mg/dL (ref 0.50–1.10)
GFR calc Af Amer: 73 mL/min — ABNORMAL LOW (ref >90)
GFR calc non Af Amer: 63 mL/min — ABNORMAL LOW (ref >90)
Glucose, Bld: 535 mg/dL — ABNORMAL HIGH (ref 70–99)
Potassium: 4.4 mEq/L (ref 3.5–5.1)
Sodium: 129 mEq/L — ABNORMAL LOW (ref 135–145)
Total Bilirubin: 0.4 mg/dL (ref 0.3–1.2)
Total Protein: 6.8 g/dL (ref 6.0–8.3)

## 2012-06-18 LAB — URINALYSIS, ROUTINE W REFLEX MICROSCOPIC
Bilirubin Urine: NEGATIVE
Glucose, UA: >1000 mg/dL — AB
Hgb urine dipstick: NEGATIVE
Ketones, ur: NEGATIVE mg/dL
Leukocytes, UA: NEGATIVE
Nitrite: NEGATIVE
Protein, ur: NEGATIVE mg/dL
Specific Gravity, Urine: 1.028 (ref 1.005–1.030)
Urobilinogen, UA: 0.2 mg/dL (ref 0.0–1.0)
pH: 6 (ref 5.0–8.0)

## 2012-06-18 LAB — LACTIC ACID, PLASMA: Lactic Acid, Venous: 2.1 mmol/L (ref 0.5–2.2)

## 2012-06-18 LAB — GLUCOSE, CAPILLARY: Glucose-Capillary: 344 mg/dL — ABNORMAL HIGH (ref 70–99)

## 2012-06-18 LAB — URINE MICROSCOPIC-ADD ON

## 2012-06-18 MED ORDER — SODIUM CHLORIDE 0.9 % IV BOLUS (SEPSIS)
1000.0000 mL | Freq: Once | INTRAVENOUS | Status: AC
  Administered 2012-06-18: 1000 mL via INTRAVENOUS

## 2012-06-18 MED ORDER — ARIPIPRAZOLE 2 MG PO TABS
2.0000 mg | ORAL_TABLET | Freq: Every morning | ORAL | Status: DC
  Administered 2012-06-19 – 2012-06-24 (×6): 2 mg via ORAL
  Filled 2012-06-18 (×6): qty 1

## 2012-06-18 MED ORDER — DIAZEPAM 5 MG PO TABS
5.0000 mg | ORAL_TABLET | Freq: Two times a day (BID) | ORAL | Status: DC
  Administered 2012-06-18 – 2012-06-24 (×12): 5 mg via ORAL
  Filled 2012-06-18 (×12): qty 1

## 2012-06-18 MED ORDER — CIPROFLOXACIN IN D5W 400 MG/200ML IV SOLN
400.0000 mg | Freq: Two times a day (BID) | INTRAVENOUS | Status: AC
  Administered 2012-06-19 – 2012-06-23 (×10): 400 mg via INTRAVENOUS
  Filled 2012-06-18 (×10): qty 200

## 2012-06-18 MED ORDER — ONDANSETRON HCL 4 MG/2ML IJ SOLN
4.0000 mg | Freq: Once | INTRAMUSCULAR | Status: AC
  Administered 2012-06-18: 4 mg via INTRAVENOUS
  Filled 2012-06-18: qty 2

## 2012-06-18 MED ORDER — METRONIDAZOLE IN NACL 5-0.79 MG/ML-% IV SOLN
500.0000 mg | Freq: Once | INTRAVENOUS | Status: AC
  Administered 2012-06-18: 500 mg via INTRAVENOUS
  Filled 2012-06-18: qty 100

## 2012-06-18 MED ORDER — METRONIDAZOLE IN NACL 5-0.79 MG/ML-% IV SOLN
500.0000 mg | Freq: Three times a day (TID) | INTRAVENOUS | Status: AC
  Administered 2012-06-19 – 2012-06-23 (×15): 500 mg via INTRAVENOUS
  Filled 2012-06-18 (×15): qty 100

## 2012-06-18 MED ORDER — DICYCLOMINE HCL 10 MG/ML IM SOLN
20.0000 mg | Freq: Once | INTRAMUSCULAR | Status: AC
  Administered 2012-06-18: 20 mg via INTRAMUSCULAR
  Filled 2012-06-18: qty 2

## 2012-06-18 MED ORDER — QUETIAPINE FUMARATE 100 MG PO TABS
100.0000 mg | ORAL_TABLET | Freq: Every day | ORAL | Status: DC
  Administered 2012-06-18 – 2012-06-24 (×27): 100 mg via ORAL
  Filled 2012-06-18 (×35): qty 1

## 2012-06-18 MED ORDER — ESCITALOPRAM OXALATE 10 MG PO TABS
10.0000 mg | ORAL_TABLET | Freq: Every evening | ORAL | Status: DC
  Administered 2012-06-18 – 2012-06-24 (×6): 10 mg via ORAL
  Filled 2012-06-18 (×8): qty 1

## 2012-06-18 MED ORDER — SODIUM CHLORIDE 0.9 % IV SOLN
INTRAVENOUS | Status: AC
  Administered 2012-06-18 – 2012-06-19 (×2): via INTRAVENOUS

## 2012-06-18 MED ORDER — ONDANSETRON HCL 4 MG PO TABS
4.0000 mg | ORAL_TABLET | Freq: Four times a day (QID) | ORAL | Status: DC | PRN
  Administered 2012-06-19 – 2012-06-23 (×3): 4 mg via ORAL
  Filled 2012-06-18 (×3): qty 1

## 2012-06-18 MED ORDER — BUPRENORPHINE 10 MCG/HR TD PTWK
10.0000 ug | MEDICATED_PATCH | TRANSDERMAL | Status: DC
  Administered 2012-06-19: 10 ug via TRANSDERMAL
  Filled 2012-06-18 (×2): qty 1

## 2012-06-18 MED ORDER — HYDROMORPHONE HCL PF 1 MG/ML IJ SOLN
1.0000 mg | INTRAMUSCULAR | Status: DC | PRN
  Administered 2012-06-18 – 2012-06-24 (×12): 1 mg via INTRAVENOUS
  Filled 2012-06-18 (×12): qty 1

## 2012-06-18 MED ORDER — ACETAMINOPHEN 650 MG RE SUPP: 650.0000 mg | Freq: Four times a day (QID) | RECTAL | Status: DC | PRN

## 2012-06-18 MED ORDER — LEVOTHYROXINE SODIUM 75 MCG PO TABS
75.0000 ug | ORAL_TABLET | Freq: Every day | ORAL | Status: DC
  Administered 2012-06-19 – 2012-06-24 (×6): 75 ug via ORAL
  Filled 2012-06-18 (×7): qty 1

## 2012-06-18 MED ORDER — ROPINIROLE HCL 0.5 MG PO TABS
0.5000 mg | ORAL_TABLET | Freq: Every day | ORAL | Status: DC
  Administered 2012-06-18 – 2012-06-23 (×6): 0.5 mg via ORAL
  Filled 2012-06-18 (×7): qty 1

## 2012-06-18 MED ORDER — ONDANSETRON HCL 4 MG/2ML IJ SOLN: 4.0000 mg | Freq: Four times a day (QID) | INTRAMUSCULAR | Status: DC | PRN

## 2012-06-18 MED ORDER — INSULIN ASPART 100 UNIT/ML ~~LOC~~ SOLN
0.0000 [IU] | Freq: Three times a day (TID) | SUBCUTANEOUS | Status: DC
  Administered 2012-06-19 (×2): 3 [IU] via SUBCUTANEOUS
  Administered 2012-06-19: 18:00:00 via SUBCUTANEOUS
  Administered 2012-06-20 (×2): 3 [IU] via SUBCUTANEOUS
  Administered 2012-06-20: 2 [IU] via SUBCUTANEOUS
  Administered 2012-06-21: 7 [IU] via SUBCUTANEOUS

## 2012-06-18 MED ORDER — ENOXAPARIN SODIUM 40 MG/0.4ML ~~LOC~~ SOLN
40.0000 mg | SUBCUTANEOUS | Status: DC
  Administered 2012-06-18 – 2012-06-23 (×5): 40 mg via SUBCUTANEOUS
  Filled 2012-06-18 (×7): qty 0.4

## 2012-06-18 MED ORDER — IOHEXOL 300 MG/ML  SOLN
100.0000 mL | Freq: Once | INTRAMUSCULAR | Status: AC | PRN
  Administered 2012-06-18: 100 mL via INTRAVENOUS

## 2012-06-18 MED ORDER — CLONIDINE HCL 0.2 MG PO TABS
0.2000 mg | ORAL_TABLET | Freq: Every day | ORAL | Status: DC
  Administered 2012-06-18 – 2012-06-23 (×6): 0.2 mg via ORAL
  Filled 2012-06-18 (×7): qty 1

## 2012-06-18 MED ORDER — ACETAMINOPHEN 325 MG PO TABS
650.0000 mg | ORAL_TABLET | Freq: Four times a day (QID) | ORAL | Status: DC | PRN
  Administered 2012-06-19 – 2012-06-23 (×6): 650 mg via ORAL
  Filled 2012-06-18 (×6): qty 2

## 2012-06-18 MED ORDER — CIPROFLOXACIN IN D5W 400 MG/200ML IV SOLN
400.0000 mg | Freq: Once | INTRAVENOUS | Status: AC
  Administered 2012-06-18: 400 mg via INTRAVENOUS
  Filled 2012-06-18: qty 200

## 2012-06-18 MED ORDER — HYDROMORPHONE HCL PF 1 MG/ML IJ SOLN
1.0000 mg | Freq: Once | INTRAMUSCULAR | Status: AC
  Administered 2012-06-18: 1 mg via INTRAVENOUS
  Filled 2012-06-18: qty 1

## 2012-06-18 MED ORDER — LITHIUM CARBONATE 300 MG PO CAPS
450.0000 mg | ORAL_CAPSULE | Freq: Every evening | ORAL | Status: DC
  Administered 2012-06-18 – 2012-06-24 (×7): 450 mg via ORAL
  Filled 2012-06-18 (×7): qty 1

## 2012-06-18 MED ORDER — IOHEXOL 300 MG/ML  SOLN
50.0000 mL | Freq: Once | INTRAMUSCULAR | Status: AC | PRN
  Administered 2012-06-18: 50 mL via INTRAVENOUS

## 2012-06-18 NOTE — ED Provider Notes (Signed)
History    67 year old female with crampy abdominal pain. Intermittent. Onset about a month ago. This is becoming more frequent to the point of being more or less constantly for the past 2 days. Describes the pain as crampy in nature and diffuse. Past history of ischemic colitis and states that her current symptoms feel similar. No fevers or chills. Nausea, but no vomiting. Recently seen by PCP for same and has been taking hycosamine with mild relief. Does generally weak. Polyuria and polydipsia.  CSN: [id number]  Arrival date & time 06/18/12  1507   First MD Initiated Contact with Patient 06/18/12 1529      Chief Complaint  Patient presents with  . Abdominal Pain  . Diarrhea    (Consider location/radiation/quality/duration/timing/severity/associated sxs/prior treatment) HPI  Past Medical History  Diagnosis Date  . Bilateral hearing loss   . Otitis media, chronic   . Urinary, incontinence, stress female   . Dyslipidemia   . Allergic rhinitis, seasonal   . Osteoarthritis cervical spine   . Osteoarthritis of hand     bilateral  . Alcohol abuse, in remission   . Depression with anxiety   . Colitis, ischemic 2012  . Sciatica   . Diverticulitis 2012  . Alcohol abuse   . Depression     Past Surgical History  Procedure Laterality Date  . D&c x 4    . Btl laproscopic    . Abdominal hysterectomy    . Tonsillectomy    . Excision of tumors at right neck      angle of jaw '68, benign  . Hearing loss      BIL HEARING LOSS  . Otitis    . Urinary incontinence    . Dislipidemia      Family History  Problem Relation Age of Onset  . Diabetes Brother   . Alcohol abuse Brother     x 2  . Drug abuse Mother   . Alcohol abuse Mother   . Alcohol abuse Father   . Colon cancer Paternal Uncle     History  Substance Use Topics  . Smoking status: Never Smoker   . Smokeless tobacco: Never Used  . Alcohol Use: No     Comment: alcohol abuse formally; has been abstinent x 3 years     OB History   Grav Para Term Preterm Abortions TAB SAB Ect Mult Living                  Review of Systems  All systems reviewed and negative, other than as noted in HPI.   Allergies  Azithromycin; Erythromycin; Codeine; Neurontin; Oxycodone; Procaine hcl; and Sulfur  Home Medications   Current Outpatient Rx  Name  Route  Sig  Dispense  Refill  . ARIPiprazole (ABILIFY) 2 MG tablet   Oral   Take 2 mg by mouth every morning.          . buprenorphine (BUTRANS - DOSED MCG/HR) 10 MCG/HR PTWK   Transdermal   Place 1 patch (10 mcg total) onto the skin once a week.   4 patch   2   . cloNIDine (CATAPRES) 0.1 MG tablet   Oral   Take 0.2 mg by mouth at bedtime.          . diazepam (VALIUM) 5 MG tablet   Oral   Take 5 mg by mouth 2 (two) times daily.          Marland Kitchen escitalopram (LEXAPRO) 10 MG tablet   Oral  Take 10 mg by mouth every evening.          . fluconazole (DIFLUCAN) 150 MG tablet   Oral   Take 1 tablet (150 mg total) by mouth once.   1 tablet   0   . hyoscyamine (LEVSIN/SL) 0.125 MG SL tablet   Sublingual   Place 1 tablet (0.125 mg total) under the tongue every 4 (four) hours as needed for cramping.   30 tablet   0   . ibuprofen (ADVIL,MOTRIN) 200 MG tablet   Oral   Take 800 mg by mouth every 6 (six) hours as needed. For pain/fever         . levothyroxine (LEVOTHROID) 75 MCG tablet   Oral   Take 75 mcg by mouth daily before breakfast.          . lithium carbonate 300 MG capsule   Oral   Take 450 mg by mouth every evening.         Marland Kitchen MINIVELLE 0.075 MG/24HR   Transdermal   Place 1 patch onto the skin 2 (two) times a week.         Marland Kitchen QUEtiapine (SEROQUEL) 100 MG tablet   Oral   Take 100 mg by mouth 5 (five) times daily.          Marland Kitchen rOPINIRole (REQUIP) 0.5 MG tablet   Oral   Take 0.5 mg by mouth at bedtime.            BP 149/89  Pulse 67  Temp(Src) 97.8 F (36.6 C)  Resp 18  SpO2 94%  Physical Exam  Nursing note and  vitals reviewed. Constitutional: She appears well-developed and well-nourished. No distress.  HENT:  Head: Normocephalic and atraumatic.  Eyes: Conjunctivae are normal. Right eye exhibits no discharge. Left eye exhibits no discharge.  Neck: Neck supple.  Cardiovascular: Normal rate, regular rhythm and normal heart sounds.  Exam reveals no gallop and no friction rub.   No murmur heard. Pulmonary/Chest: Effort normal and breath sounds normal. No respiratory distress.  Abdominal: Soft. She exhibits no distension. There is tenderness.  Diffuse abdominal tenderness w/o guarding or rebound  Musculoskeletal: She exhibits no edema and no tenderness.  Neurological: She is alert.  Skin: Skin is warm and dry.  Psychiatric: She has a normal mood and affect. Her behavior is normal. Thought content normal.    ED Course  Procedures (including critical care time)  Labs Reviewed  CBC WITH DIFFERENTIAL - Abnormal; Notable for the following:    RBC 5.12 (*)    Hemoglobin 15.2 (*)    All other components within normal limits  COMPREHENSIVE METABOLIC PANEL - Abnormal; Notable for the following:    Sodium 129 (*)    Chloride 92 (*)    Glucose, Bld 535 (*)    Albumin 3.4 (*)    AST 43 (*)    ALT 53 (*)    Alkaline Phosphatase 179 (*)    GFR calc non Af Amer 63 (*)    GFR calc Af Amer 73 (*)    All other components within normal limits  URINALYSIS, ROUTINE W REFLEX MICROSCOPIC - Abnormal; Notable for the following:    Glucose, UA >1000 (*)    All other components within normal limits  URINE MICROSCOPIC-ADD ON   Ct Abdomen Pelvis W Contrast  06/18/2012  *RADIOLOGY REPORT*  Clinical Data: 38-month history of lower abdominal pain, predominately left lower quadrant, and intermittent diarrhea. Prior history of colitis.  CT ABDOMEN AND  PELVIS WITH CONTRAST  Technique:  Multidetector CT imaging of the abdomen and pelvis was performed following the standard protocol during bolus administration of  intravenous contrast.  Contrast:  100 ml Omnipaque-300 IV. Oral contrast was also administered.  Comparison: CT abdomen pelvis 12/25/2010.  Findings: Chronic moderate to severe hydronephrosis involving the right kidney without associated ureteral dilation, consistent with a congenital UPJ stenosis, unchanged.  Mild cortical thinning involving the right kidney.  No evidence of obstruction of the right upper urinary tract, as there is symmetric contrast excretion by both kidneys.  Normal-appearing left kidney.  Severe diffuse hepatic steatosis without focal hepatic parenchymal abnormality; focal sparing surrounding the gallbladder.  Normal spleen, pancreas, adrenal glands, and gallbladder.  No biliary ductal dilation.  No visible aorto-iliofemoral atherosclerosis. Patent visceral arteries.  No significant lymphadenopathy.  Stomach normal in appearance, filled with food.   Normal-appearing small bowel.  Wall thickening with mucosal enhancement involving the mid and distal transverse colon, descending colon, sigmoid colon, and rectum.  Ascending colon and proximal transverse colon not involved.  Scattered sigmoid colon diverticula.  Appendix not clearly visualized, but no pericecal inflammation.  No ascites. Small periumbilical hernia containing fat.  Enhancement of a several centimeter segment of the distal left ureteral wall, adjacent to the inflamed sigmoid colon, therefore likely secondarily involved. Urinary bladder unremarkable.  Uterus surgically absent.  No adnexal masses or free pelvic fluid. Numerous pelvic phleboliths.  Bone window images demonstrate lower thoracic spondylosis, mild degenerative changes involving the facet joints the lower lumbar spine, and mild degenerative changes in both hips.  Visualized lung bases clear apart from the expected dependent atelectasis posteriorly.  Heart size normal.  IMPRESSION:  1.  Colitis involving the mid and distal transverse colon, descending colon, sigmoid colon,  and rectum. 2.  Secondary inflammation of the distal left ureter as it courses alongside the sigmoid colon. 3.  Severe diffuse hepatic steatosis. 4.  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 5.  Sigmoid colon diverticulosis without evidence of acute diverticulitis.   Original Report Authenticated By: Hulan Saas, M.D.      1. Colitis   2. Hyperglycemia   3. Bipolar 1 disorder   4. Hypertension       MDM  66yF w/ abdominal pain and diarrhea. CT consistent with colitis. Pt with hx of ischemic colitis, but will tx for possible infectious etiology. Significant hyperglycemia. No anion gap. No ketonuria. New diagnosis. Symptomatic with polyuria/polydipsia/fatigue. Glucose 530->340 with 1L. Additional IVF ordered. Will defer from insulin at this time. Admit.         Raeford Razor, MD 06/19/12 [phone]

## 2012-06-18 NOTE — H&P (Signed)
[facility] History and Physical  [name] [name] ZOX:[id number] DOB: September 16, 1945 DOA: 06/18/2012  Referring physician: Dr.Kohut. PCP: [name] Regulus, MD  Specialists: None.  Chief Complaint: Abdominal pain and diarrhea.  HPI: [name] [name] is a 67 y.o. female previous history of ischemic colitis, hypertension, hypothyroidism, bipolar disorder presents with complaints of abdominal pain worsening over the last one week. Patient states that she's been having abdominal pain in the both lower quadrants over the last one month. Over the last one week it has gradually worsened. The pain is mostly in the lower quadrants and dull aching constant increased on eating. Patient also has having diarrhea which is nonbloody and increased on eating. Denies any vomiting but does have mild nausea. Denies having used any recent antibiotics except fluconazole today for vaginal yeast. CT abdomen and pelvis done shows features concerning for colitis and has been admitted for further management.  Review of Systems: As presented in the history of presenting illness, rest negative.  Past Medical History  Diagnosis Date  . Bilateral hearing loss   . Otitis media, chronic   . Urinary, incontinence, stress female   . Dyslipidemia   . Allergic rhinitis, seasonal   . Osteoarthritis cervical spine   . Osteoarthritis of hand     bilateral  . Alcohol abuse, in remission   . Depression with anxiety   . Colitis, ischemic 2012  . Sciatica   . Diverticulitis 2012  . Alcohol abuse   . Depression    Past Surgical History  Procedure Laterality Date  . D&c x 4    . Btl laproscopic    . Abdominal hysterectomy    . Tonsillectomy    . Excision of tumors at right neck      angle of jaw '68, benign  . Hearing loss      BIL HEARING LOSS  . Otitis    . Urinary incontinence    . Dislipidemia     Social History:  reports that she has never smoked. She has never used smokeless tobacco. She reports that she does  not drink alcohol or use illicit drugs. Lives at home. where does patient live-- Can do ADLs. Can patient participate in ADLs?  Allergies  Allergen Reactions  . Azithromycin Nausea And Vomiting    All mycins  . Erythromycin Nausea And Vomiting  . Codeine Other (See Comments)    Feeling weird   . Neurontin (Gabapentin) Other (See Comments)    Reaction=unknown  . Oxycodone Other (See Comments)    Reaction=unknown  . Procaine Hcl Other (See Comments)    Reaction=unknown  . Sulfur Other (See Comments)    Reaction=unknown    Family History  Problem Relation Age of Onset  . Diabetes Brother   . Alcohol abuse Brother     x 2  . Drug abuse Mother   . Alcohol abuse Mother   . Alcohol abuse Father   . Colon cancer Paternal Uncle       Prior to Admission medications   Medication Sig Start Date End Date Taking? Authorizing Provider  ARIPiprazole (ABILIFY) 2 MG tablet Take 2 mg by mouth every morning.    Yes Historical Provider, MD  buprenorphine (BUTRANS - DOSED MCG/HR) 10 MCG/HR PTWK Place 1 patch (10 mcg total) onto the skin once a week. 05/23/12  Yes [name] Colace, MD  cloNIDine (CATAPRES) 0.1 MG tablet Take 0.2 mg by mouth at bedtime.  03/16/11  Yes [name] Navy, MD  diazepam (VALIUM) 5 MG tablet  Take 5 mg by mouth 2 (two) times daily.    Yes Historical Provider, MD  escitalopram (LEXAPRO) 10 MG tablet Take 10 mg by mouth every evening.    Yes Historical Provider, MD  fluconazole (DIFLUCAN) 150 MG tablet Take 1 tablet (150 mg total) by mouth once. 06/16/12  Yes [name] Navy, MD  hyoscyamine (LEVSIN/SL) 0.125 MG SL tablet Place 1 tablet (0.125 mg total) under the tongue every 4 (four) hours as needed for cramping. 06/16/12  Yes [name] Navy, MD  ibuprofen (ADVIL,MOTRIN) 200 MG tablet Take 800 mg by mouth every 6 (six) hours as needed. For pain/fever   Yes Historical Provider, MD  levothyroxine (LEVOTHROID) 75 MCG tablet Take 75 mcg by mouth daily before breakfast.    Yes  Historical Provider, MD  lithium carbonate 300 MG capsule Take 450 mg by mouth every evening.   Yes Historical Provider, MD  MINIVELLE 0.075 MG/24HR Place 1 patch onto the skin 2 (two) times a week. 01/07/12  Yes Historical Provider, MD  QUEtiapine (SEROQUEL) 100 MG tablet Take 100 mg by mouth 5 (five) times daily.    Yes Historical Provider, MD  rOPINIRole (REQUIP) 0.5 MG tablet Take 0.5 mg by mouth at bedtime.    Yes Historical Provider, MD   Physical Exam: Filed Vitals:   06/18/12 1516 06/18/12 1833  BP: 134/78 149/89  Pulse: 94 67  Temp: 97.8 F (36.6 C)   Resp: 20 18  SpO2: 97% 94%     General:  Well-developed well-nourished.  Eyes: Anicteric no pallor.  ENT: No discharge from the ears eyes nose and mouth.  Neck: No mass felt.  Cardiovascular: S1-S2 heard.  Respiratory: No rhonchi or crepitations.     Abdomen: Patient has already CT pain medication and presently is not tender. No guarding no rigidity. Bowel sounds present.  Skin: No rash.  Musculoskeletal: No edema.  Psychiatric: Appears normal.  Neurologic: Alert oriented to time place and person. Moves all extremities.  Labs on Admission:  Basic Metabolic Panel:  Recent Labs Lab 06/18/12 1615  NA 129*  K 4.4  CL 92*  CO2 24  GLUCOSE 535*  BUN 12  CREATININE 0.94  CALCIUM 10.0   Liver Function Tests:  Recent Labs Lab 06/18/12 1615  AST 43*  ALT 53*  ALKPHOS 179*  BILITOT 0.4  PROT 6.8  ALBUMIN 3.4*   No results found for this basename: LIPASE, AMYLASE,  in the last 168 hours No results found for this basename: AMMONIA,  in the last 168 hours CBC:  Recent Labs Lab 06/18/12 1615  WBC 9.3  NEUTROABS 7.1  HGB 15.2*  HCT 44.9  MCV 87.7  PLT 185   Cardiac Enzymes: No results found for this basename: CKTOTAL, CKMB, CKMBINDEX, TROPONINI,  in the last 168 hours  BNP (last 3 results)  Recent Labs  11/27/11 1521  PROBNP 64.7   CBG:  Recent Labs Lab 06/18/12 1849  GLUCAP 344*     Radiological Exams on Admission: Ct Abdomen Pelvis W Contrast  06/18/2012  *RADIOLOGY REPORT*  Clinical Data: 38-month history of lower abdominal pain, predominately left lower quadrant, and intermittent diarrhea. Prior history of colitis.  CT ABDOMEN AND PELVIS WITH CONTRAST  Technique:  Multidetector CT imaging of the abdomen and pelvis was performed following the standard protocol during bolus administration of intravenous contrast.  Contrast:  100 ml Omnipaque-300 IV. Oral contrast was also administered.  Comparison: CT abdomen pelvis 12/25/2010.  Findings: Chronic moderate to severe hydronephrosis involving  the right kidney without associated ureteral dilation, consistent with a congenital UPJ stenosis, unchanged.  Mild cortical thinning involving the right kidney.  No evidence of obstruction of the right upper urinary tract, as there is symmetric contrast excretion by both kidneys.  Normal-appearing left kidney.  Severe diffuse hepatic steatosis without focal hepatic parenchymal abnormality; focal sparing surrounding the gallbladder.  Normal spleen, pancreas, adrenal glands, and gallbladder.  No biliary ductal dilation.  No visible aorto-iliofemoral atherosclerosis. Patent visceral arteries.  No significant lymphadenopathy.  Stomach normal in appearance, filled with food.   Normal-appearing small bowel.  Wall thickening with mucosal enhancement involving the mid and distal transverse colon, descending colon, sigmoid colon, and rectum.  Ascending colon and proximal transverse colon not involved.  Scattered sigmoid colon diverticula.  Appendix not clearly visualized, but no pericecal inflammation.  No ascites. Small periumbilical hernia containing fat.  Enhancement of a several centimeter segment of the distal left ureteral wall, adjacent to the inflamed sigmoid colon, therefore likely secondarily involved. Urinary bladder unremarkable.  Uterus surgically absent.  No adnexal masses or free pelvic fluid.  Numerous pelvic phleboliths.  Bone window images demonstrate lower thoracic spondylosis, mild degenerative changes involving the facet joints the lower lumbar spine, and mild degenerative changes in both hips.  Visualized lung bases clear apart from the expected dependent atelectasis posteriorly.  Heart size normal.  IMPRESSION:  1.  Colitis involving the mid and distal transverse colon, descending colon, sigmoid colon, and rectum. 2.  Secondary inflammation of the distal left ureter as it courses alongside the sigmoid colon. 3.  Severe diffuse hepatic steatosis. 4.  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 5.  Sigmoid colon diverticulosis without evidence of acute diverticulitis.   Original Report Authenticated By: Hulan Saas, M.D.      Assessment/Plan Principal Problem:   Colitis Active Problems:   Hyperglycemia   Hypertension   Bipolar 1 disorder   1. Colitis involving the mid transverse down to the rectum with secondary inflammation of the distal left ureter - patient has been started on Cipro and Flagyl. Continue with hydration. Since patient has had previous history of ischemic colitis, ischemic colitis will be the primary concern at this time. We'll also check for C. difficile PCR and stool cultures. Check lactic acid levels. Continue pain relief medications.  2. Hyperglycemia with possible new onset diabetes mellitus - patient's blood sugar was elevated at admission about more than 500 which improved with 1 L normal saline bolus. At this time I have placed patient on sliding-scale coverage. For now I have ordered every 4 hourly which could be changed to a.c. and at bedtime in a.m. Check hemoglobin A1c. 3. Hypertension - continue home medications. 4. Bipolar disorder - check lithium levels.    Code Status: Full code.  Family Communication: None.  Disposition Plan: Admit to inpatient.    Craven Crean N. [facility] Pager [phone].  If 7PM-7AM,  please contact night-coverage [email or web address] Password [facility] 06/18/2012, 7:28 PM

## 2012-06-18 NOTE — Progress Notes (Addendum)
ANTIBIOTIC CONSULT NOTE - INITIAL  Pharmacy Consult for Ciprofloxacin Indication: Colitis  Allergies  Allergen Reactions  . Azithromycin Nausea And Vomiting    All mycins  . Erythromycin Nausea And Vomiting  . Codeine Other (See Comments)    Feeling weird   . Neurontin (Gabapentin) Other (See Comments)    Reaction=unknown  . Oxycodone Other (See Comments)    Reaction=unknown  . Procaine Hcl Other (See Comments)    Reaction=unknown  . Sulfur Other (See Comments)    Reaction=unknown    Patient Measurements: Height: 5\' 2"  (157.5 cm) Weight: 178 lb 9.2 oz (81 kg) IBW/kg (Calculated) : 50.1  Vital Signs: Temp: 98.2 F (36.8 C) (04/12 1900) Temp src: Oral (04/12 1900) BP: 145/67 mmHg (04/12 1900) Pulse Rate: 77 (04/12 1900) Intake/Output from previous day:   Intake/Output from this shift:    Labs:  Recent Labs  06/18/12 1615  WBC 9.3  HGB 15.2*  PLT 185  CREATININE 0.94   Estimated Creatinine Clearance: 59.7 ml/min (by C-G formula based on Cr of 0.94). No results found for this basename: VANCOTROUGH, VANCOPEAK, VANCORANDOM, GENTTROUGH, GENTPEAK, GENTRANDOM, TOBRATROUGH, TOBRAPEAK, TOBRARND, AMIKACINPEAK, AMIKACINTROU, AMIKACIN,  in the last 72 hours   Microbiology: No results found for this or any previous visit (from the past 720 hour(s)).  Medical History: Past Medical History  Diagnosis Date  . Bilateral hearing loss   . Otitis media, chronic   . Urinary, incontinence, stress female   . Dyslipidemia   . Allergic rhinitis, seasonal   . Osteoarthritis cervical spine   . Osteoarthritis of hand     bilateral  . Alcohol abuse, in remission   . Depression with anxiety   . Colitis, ischemic 2012  . Sciatica   . Diverticulitis 2012  . Alcohol abuse   . Depression     Assessment:  89 yof presented 4/12 with c/o abd pain x 1 month and diarrhea.  CT abd/pelvis with colitis. Patient received a dose of Ciprofloxacin 4/12 @ 1917 and MD ordered for pharmacy  to continue dosing Ciprofloxacin.  Flagyl also ordered by MD.  Patient is afebrile, WBC wnl , Scr 0.94, CrCl of 60 ml/min.    Plan:   Ciprofloxacin 400 mg IV q12h, next dose due at 0600 AM.  Pharmacy will f/u  Geoffry Paradise, PharmD, BCPS Pager: [phone] 8:42 PM Pharmacy #: [phone]

## 2012-06-18 NOTE — ED Notes (Signed)
Pt presents to ed with c/o abdominal pain x1 month and diarrhea. Pt sts seen by PCP for same and was given hyoscyamine for cramping as well as diflucan, without relief.

## 2012-06-19 DIAGNOSIS — IMO0001 Reserved for inherently not codable concepts without codable children: Secondary | ICD-10-CM

## 2012-06-19 DIAGNOSIS — K5289 Other specified noninfective gastroenteritis and colitis: Principal | ICD-10-CM

## 2012-06-19 DIAGNOSIS — E119 Type 2 diabetes mellitus without complications: Secondary | ICD-10-CM

## 2012-06-19 LAB — CBC WITH DIFFERENTIAL/PLATELET
Basophils Absolute: 0 10*3/uL (ref 0.0–0.1)
Basophils Relative: 1 % (ref 0–1)
Eosinophils Absolute: 0.4 10*3/uL (ref 0.0–0.7)
Eosinophils Relative: 6 % — ABNORMAL HIGH (ref 0–5)
HCT: 39.2 % (ref 36.0–46.0)
Hemoglobin: 13.3 g/dL (ref 12.0–15.0)
Lymphocytes Relative: 26 % (ref 12–46)
Lymphs Abs: 1.7 10*3/uL (ref 0.7–4.0)
MCH: 30 pg (ref 26.0–34.0)
MCHC: 33.9 g/dL (ref 30.0–36.0)
MCV: 88.3 fL (ref 78.0–100.0)
Monocytes Absolute: 0.4 10*3/uL (ref 0.1–1.0)
Monocytes Relative: 6 % (ref 3–12)
Neutro Abs: 4 10*3/uL (ref 1.7–7.7)
Neutrophils Relative %: 62 % (ref 43–77)
Platelets: 152 10*3/uL (ref 150–400)
RBC: 4.44 MIL/uL (ref 3.87–5.11)
RDW: 12.7 % (ref 11.5–15.5)
WBC: 6.4 10*3/uL (ref 4.0–10.5)

## 2012-06-19 LAB — COMPREHENSIVE METABOLIC PANEL
ALT: 44 U/L — ABNORMAL HIGH (ref 0–35)
AST: 33 U/L (ref 0–37)
Albumin: 2.9 g/dL — ABNORMAL LOW (ref 3.5–5.2)
Alkaline Phosphatase: 139 U/L — ABNORMAL HIGH (ref 39–117)
BUN: 8 mg/dL (ref 6–23)
CO2: 25 mEq/L (ref 19–32)
Calcium: 8.4 mg/dL (ref 8.4–10.5)
Chloride: 99 mEq/L (ref 96–112)
Creatinine, Ser: 0.9 mg/dL (ref 0.50–1.10)
GFR calc Af Amer: 77 mL/min — ABNORMAL LOW (ref >90)
GFR calc non Af Amer: 66 mL/min — ABNORMAL LOW (ref >90)
Glucose, Bld: 272 mg/dL — ABNORMAL HIGH (ref 70–99)
Potassium: 3.8 mEq/L (ref 3.5–5.1)
Sodium: 132 mEq/L — ABNORMAL LOW (ref 135–145)
Total Bilirubin: 0.4 mg/dL (ref 0.3–1.2)
Total Protein: 5.6 g/dL — ABNORMAL LOW (ref 6.0–8.3)

## 2012-06-19 LAB — GLUCOSE, CAPILLARY
Glucose-Capillary: 201 mg/dL — ABNORMAL HIGH (ref 70–99)
Glucose-Capillary: 224 mg/dL — ABNORMAL HIGH (ref 70–99)
Glucose-Capillary: 241 mg/dL — ABNORMAL HIGH (ref 70–99)
Glucose-Capillary: 247 mg/dL — ABNORMAL HIGH (ref 70–99)
Glucose-Capillary: 251 mg/dL — ABNORMAL HIGH (ref 70–99)
Glucose-Capillary: 251 mg/dL — ABNORMAL HIGH (ref 70–99)

## 2012-06-19 LAB — TSH: TSH: 2.318 u[IU]/mL (ref 0.350–4.500)

## 2012-06-19 LAB — HEMOGLOBIN A1C
Hgb A1c MFr Bld: 10.6 % — ABNORMAL HIGH (ref <5.7)
Mean Plasma Glucose: 258 mg/dL — ABNORMAL HIGH (ref <117)

## 2012-06-19 LAB — LITHIUM LEVEL: Lithium Lvl: <0.25 mEq/L — ABNORMAL LOW (ref 0.80–1.40)

## 2012-06-19 NOTE — Progress Notes (Signed)
Subjective: [name] [name] has been admitted for possible recurrent colitis with a history of bilateral lower abdominal pain and change in bowel habit with CT chagnes suggestive of colitis.   Objective: Lab:  Recent Labs  06/18/12 1615 06/19/12 0500  WBC 9.3 6.4  NEUTROABS 7.1 4.0  HGB 15.2* 13.3  HCT 44.9 39.2  MCV 87.7 88.3  PLT 185 152    Recent Labs  06/18/12 1615 06/19/12 0500  NA 129* 132*  K 4.4 3.8  CL 92* 99  GLUCOSE 535* 272*  BUN 12 8  CREATININE 0.94 0.90  CALCIUM 10.0 8.4    Imaging: CT abd/pelvis: IMPRESSION:  1. Colitis involving the mid and distal transverse colon,  descending colon, sigmoid colon, and rectum.  2. Secondary inflammation of the distal left ureter as it courses  alongside the sigmoid colon.  3. Severe diffuse hepatic steatosis.  4. 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  5. Sigmoid colon diverticulosis without evidence of acute  diverticulitis.  Scheduled Meds: . ARIPiprazole  2 mg Oral q morning - 10a  . buprenorphine  10 mcg Transdermal Weekly  . ciprofloxacin  400 mg Intravenous Q12H  . cloNIDine  0.2 mg Oral QHS  . diazepam  5 mg Oral BID  . enoxaparin (LOVENOX) injection  40 mg Subcutaneous Q24H  . escitalopram  10 mg Oral QPM  . insulin aspart  0-9 Units Subcutaneous TID WC  . levothyroxine  75 mcg Oral QAC breakfast  . lithium carbonate  450 mg Oral QPM  . metronidazole  500 mg Intravenous Q8H  . QUEtiapine  100 mg Oral 5 X Daily  . rOPINIRole  0.5 mg Oral QHS   Continuous Infusions: . sodium chloride 100 mL/hr at 06/19/12 0948   PRN Meds:.acetaminophen, acetaminophen, HYDROmorphone (DILAUDID) injection, ondansetron (ZOFRAN) IV, ondansetron   Physical Exam: Filed Vitals:   06/19/12 0600  BP: 109/58  Pulse: 68  Temp: 98.1 F (36.7 C)  Resp: 18   Gen'l - WNWD white woman in no distress HEENT  C&S w/o icterus Cor- RRR Pulm - normal respirations ABd - BS+, distended, mild  tenderness.     Assessment/Plan: 1. GI - patient seen Thursday, 4/11, with symptoms that were suggestive of IBS. Her pain got worse and she came to ED with CT evidence to suggest colitis. She is afebrile and has a normal WBC.  Plan Stool for C. Diff ordered and pending (unlikely dx)  Continue cipro and flagyl IV for colitis  GI consult in AM  2. Psych - continue home medications.   3. Hyperglycemia - not previously diagnosed with active DM but serum glucose was very high.  Lab Results  Component Value Date   HGBA1C 10.6* 06/18/2012   Plan  will continue sliding scale  At discharge will need to adhere to diet and will treat with metformin    Illene Regulus [place] [facility] (o) [phone]; (c) [phone] Call-grp - Patsi Sears IM  Tele: [phone]  06/19/2012, 12:15 PM

## 2012-06-19 NOTE — Assessment & Plan Note (Signed)
Continue prudent diet.

## 2012-06-19 NOTE — Progress Notes (Signed)
Utilization Review Completed.Eretria Manternach T4/13/2014  

## 2012-06-20 DIAGNOSIS — N760 Acute vaginitis: Secondary | ICD-10-CM

## 2012-06-20 DIAGNOSIS — F319 Bipolar disorder, unspecified: Secondary | ICD-10-CM

## 2012-06-20 LAB — GLUCOSE, CAPILLARY
Glucose-Capillary: 222 mg/dL — ABNORMAL HIGH (ref 70–99)
Glucose-Capillary: 223 mg/dL — ABNORMAL HIGH (ref 70–99)
Glucose-Capillary: 237 mg/dL — ABNORMAL HIGH (ref 70–99)
Glucose-Capillary: 232 mg/dL — ABNORMAL HIGH (ref 70–99)
Glucose-Capillary: 196 mg/dL — ABNORMAL HIGH (ref 70–99)
Glucose-Capillary: 255 mg/dL — ABNORMAL HIGH (ref 70–99)

## 2012-06-20 MED ORDER — LIVING WELL WITH DIABETES BOOK
Freq: Once | Status: AC
  Administered 2012-06-20: 11:00:00
  Filled 2012-06-20: qty 1

## 2012-06-20 MED ORDER — CLOTRIMAZOLE 1 % VA CREA
1.0000 | TOPICAL_CREAM | Freq: Every day | VAGINAL | Status: DC
  Administered 2012-06-20 – 2012-06-23 (×5): 1 via VAGINAL
  Filled 2012-06-20: qty 45

## 2012-06-20 NOTE — Progress Notes (Signed)
Subjective: [name] [name] reports shehad nausea after eating. She has not had any BMs. She does report and episode of night sweats and c/o vaginal itching/burning  Objective: Lab:  Recent Labs  06/18/12 1615 06/19/12 0500  WBC 9.3 6.4  NEUTROABS 7.1 4.0  HGB 15.2* 13.3  HCT 44.9 39.2  MCV 87.7 88.3  PLT 185 152    Recent Labs  06/18/12 1615 06/19/12 0500  NA 129* 132*  K 4.4 3.8  CL 92* 99  GLUCOSE 535* 272*  BUN 12 8  CREATININE 0.94 0.90  CALCIUM 10.0 8.4    Imaging: no new imaging  Scheduled Meds: . ARIPiprazole  2 mg Oral q morning - 10a  . buprenorphine  10 mcg Transdermal Weekly  . ciprofloxacin  400 mg Intravenous Q12H  . cloNIDine  0.2 mg Oral QHS  . diazepam  5 mg Oral BID  . enoxaparin (LOVENOX) injection  40 mg Subcutaneous Q24H  . escitalopram  10 mg Oral QPM  . insulin aspart  0-9 Units Subcutaneous TID WC  . levothyroxine  75 mcg Oral QAC breakfast  . lithium carbonate  450 mg Oral QPM  . metronidazole  500 mg Intravenous Q8H  . QUEtiapine  100 mg Oral 5 X Daily  . rOPINIRole  0.5 mg Oral QHS   Continuous Infusions:  PRN Meds:.acetaminophen, acetaminophen, HYDROmorphone (DILAUDID) injection, ondansetron (ZOFRAN) IV, ondansetron   Physical Exam: Filed Vitals:   06/20/12 0616  BP: 132/70  Pulse: 68  Temp: 97.9 F (36.6 C)  Resp: 17   Gen'l  Overweight white woman in no distress HEENT C&S clear Cor - RRR PUlm - normal respirations Abdomen - hypoactive to absent BS, no guarding or rebound, no marked tenderness to palpation Neuro - A&O x 3      Assessment/Plan: 1. GI - Colitis by CT. Continue N/V. Day # Cipro/flagyl. No stool studies yet. Plan Continue Cipro/Flagyl  GI consult called  2. Psych - stable  3. DM On sliding scale Plan Diabetes education  AT discharge will start metformin 500 mg bid along with diet management  4. Vaginitis - per patient report Plan terazol vaginal cream.   [name] (o)  [phone]; (c) [phone] Call-grp - Patsi Sears IM  Tele: [phone]  06/20/2012, 9:30 AM
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
## 2012-06-20 NOTE — Progress Notes (Addendum)
Inpatient Diabetes Program Recommendations  AACE/ADA: New Consensus Statement on Inpatient Glycemic Control (2013)  Target Ranges:  Prepandial:   less than 140 mg/dL      Peak postprandial:   less than 180 mg/dL (1-2 hours)      Critically ill patients:  140 - 180 mg/dL     Admitted with colitis.  Diagnosed with new onset diabetes this admission.  A1c 10.6% (06/18/12).  PCP is Dr. Debby Bud.  Spoke with pt about new diagnosis.  Discussed A1C results with her and explained what an A1C is, basic pathophysiology of DM Type 2, basic home care, importance of checking CBGs and maintaining good CBG control to prevent long-term and short-term complications.  Reviewed signs and symptoms of hyperglycemia and hypoglycemia.  Encouraged patient to check her CBGs at least once per day (always in the morning) and to also periodically check another CBG during the day at least 3-4 times per week.  Encouraged patient to keep a log book of all her CBGs.  RNs to provide ongoing basic DM education at bedside with this patient.  Have ordered educational booklet, RD consult for diet education, and DM videos.  Will place referral for further outpatient diabetes education follow-up at the [facility] center.  Dr. Debby Bud-  Noted you may send patient home on Metformin to start.  Given the fact that patient has issues with colitis/diarrhea/etc, do you think we should start her on a different oral medication like a DPP-4 inhibitor like Januvia?  Reviewed the possible side effects of Metformin with patient today and she is concerned she will not be able to differentiate between the possible side effects of Metformin and her colitis.   Also- Please make sure to give patient a Rx for a CBG meter at discharge.  MD- Please consider increasing Novolog correction scale (SSI) to Moderate scale tid ac + HS  If fasting sugars continue to be elevated in -hospital, please consider adding low dose basal  insulin (Lantus or Levemir).    Will follow. Ambrose Finland RN, MSN, CDE Diabetes Coordinator Inpatient Diabetes Program [phone]

## 2012-06-20 NOTE — Progress Notes (Signed)
Nutrition Education Note  RD consulted for nutrition education regarding diabetes.   Lab Results  Component Value Date   HGBA1C 10.6* 06/18/2012    RD provided "Carbohydrate Counting for People with Diabetes" handout from the Academy of Nutrition and Dietetics. Discussed different food groups and their effects on blood sugar, emphasizing carbohydrate-containing foods. Provided list of carbohydrates and recommended serving sizes of common foods.  Discussed importance of controlled and consistent carbohydrate intake throughout the day. Provided examples of ways to balance meals/snacks and encouraged intake of high-fiber, whole grain complex carbohydrates. Teach back method used.  Pt's husband does all the cooking. Dietary recall is WNL for carbohydrate portion sizes and amounts except for the 2-32 ounce sweet teas from [employer] she drinks/day. Encouraged water instead. [facility] and [facility] have encouraged her to start walking 10 minutes/day.   Expect good compliance. Her daughter is a Charity fundraiser. Pt has been watching all of the diabetic videos ans is eager to learn.   Body mass index is 32.65 kg/(m^2). Pt meets criteria for class I obesity based on current BMI.  Current diet order is clear liquid, patient is consuming approximately 100% of meals at this time. Labs and medications reviewed. No further nutrition interventions warranted at this time. RD contact information provided. If additional nutrition issues arise, please re-consult RD.  Levon Hedger MS, RD, LDN [phone] Pager [phone] After Hours Pager

## 2012-06-20 NOTE — Progress Notes (Signed)
Pt has viewed diabetic teaching videos 501, 502 & 503.  Pt feels like overwhelmed with all the information received.  She doesn't want to watch another video at this time.

## 2012-06-20 NOTE — Consult Note (Signed)
Patient seen, examined, and I agree with the above documentation, including the assessment and plan. Agree with flex sig to eval colitis (ddx is IBD, infectious, ischemic). On empiric abx.  Diarrhea has slowed significantly to this point Further recs after flex sig

## 2012-06-21 ENCOUNTER — Encounter (HOSPITAL_COMMUNITY): Payer: Self-pay | Admitting: *Deleted

## 2012-06-21 ENCOUNTER — Telehealth: Payer: Self-pay | Admitting: *Deleted

## 2012-06-21 ENCOUNTER — Encounter (HOSPITAL_COMMUNITY)
Admission: EM | Disposition: A | Discharge status: Home Health | Payer: Self-pay | Source: Home / Self Care | Attending: Internal Medicine

## 2012-06-21 ENCOUNTER — Telehealth: Payer: Self-pay

## 2012-06-21 HISTORY — PX: FLEXIBLE SIGMOIDOSCOPY: SHX5431

## 2012-06-21 LAB — GLUCOSE, CAPILLARY
Glucose-Capillary: 306 mg/dL — ABNORMAL HIGH (ref 70–99)
Glucose-Capillary: 241 mg/dL — ABNORMAL HIGH (ref 70–99)
Glucose-Capillary: 171 mg/dL — ABNORMAL HIGH (ref 70–99)
Glucose-Capillary: 213 mg/dL — ABNORMAL HIGH (ref 70–99)
Glucose-Capillary: 202 mg/dL — ABNORMAL HIGH (ref 70–99)

## 2012-06-21 SURGERY — SIGMOIDOSCOPY, FLEXIBLE
Anesthesia: Moderate Sedation

## 2012-06-21 MED ORDER — INSULIN ASPART 100 UNIT/ML ~~LOC~~ SOLN
0.0000 [IU] | Freq: Three times a day (TID) | SUBCUTANEOUS | Status: DC
  Administered 2012-06-21: 5 [IU] via SUBCUTANEOUS
  Administered 2012-06-21: 3 [IU] via SUBCUTANEOUS
  Administered 2012-06-22 (×2): 5 [IU] via SUBCUTANEOUS
  Administered 2012-06-22: 11 [IU] via SUBCUTANEOUS
  Administered 2012-06-23 (×2): 8 [IU] via SUBCUTANEOUS
  Administered 2012-06-23: 2 [IU] via SUBCUTANEOUS
  Administered 2012-06-24 (×2): 5 [IU] via SUBCUTANEOUS
  Administered 2012-06-24: 3 [IU] via SUBCUTANEOUS

## 2012-06-21 MED ORDER — FENTANYL CITRATE 0.05 MG/ML IJ SOLN
INTRAMUSCULAR | Status: DC | PRN
  Administered 2012-06-21 (×3): 25 ug via INTRAVENOUS

## 2012-06-21 MED ORDER — DIPHENHYDRAMINE HCL 50 MG/ML IJ SOLN
INTRAMUSCULAR | Status: AC
  Filled 2012-06-21: qty 1

## 2012-06-21 MED ORDER — FENTANYL CITRATE 0.05 MG/ML IJ SOLN
INTRAMUSCULAR | Status: AC
  Filled 2012-06-21: qty 2

## 2012-06-21 MED ORDER — INSULIN GLARGINE 100 UNIT/ML ~~LOC~~ SOLN
5.0000 [IU] | Freq: Every day | SUBCUTANEOUS | Status: DC
  Administered 2012-06-21: 5 [IU] via SUBCUTANEOUS
  Filled 2012-06-21 (×3): qty 0.05

## 2012-06-21 MED ORDER — MIDAZOLAM HCL 10 MG/2ML IJ SOLN
INTRAMUSCULAR | Status: DC | PRN
  Administered 2012-06-21 (×3): 2 mg via INTRAVENOUS

## 2012-06-21 MED ORDER — MIDAZOLAM HCL 10 MG/2ML IJ SOLN
INTRAMUSCULAR | Status: AC
  Filled 2012-06-21: qty 2

## 2012-06-21 MED ORDER — DIPHENHYDRAMINE HCL 50 MG/ML IJ SOLN
INTRAMUSCULAR | Status: DC | PRN
  Administered 2012-06-21: 25 mg via INTRAVENOUS

## 2012-06-21 MED ORDER — SODIUM CHLORIDE 0.9 % IV SOLN: INTRAVENOUS | Status: DC

## 2012-06-21 NOTE — Progress Notes (Signed)
ANTIBIOTIC CONSULT NOTE - Follow up  Pharmacy Consult for Ciprofloxacin Indication: Colitis  Allergies  Allergen Reactions  . Azithromycin Nausea And Vomiting    All mycins  . Erythromycin Nausea And Vomiting  . Codeine Other (See Comments)    Feeling weird   . Neurontin (Gabapentin) Other (See Comments)    Reaction=unknown  . Oxycodone Other (See Comments)    Reaction=unknown  . Procaine Hcl Other (See Comments)    Reaction=unknown  . Sulfur Other (See Comments)    Reaction=unknown    Patient Measurements: Height: 5\' 2"  (157.5 cm) Weight: 178 lb 9.2 oz (81 kg) IBW/kg (Calculated) : 50.1  Vital Signs: Temp: 98 F (36.7 C) (04/15 0508) Temp src: Oral (04/15 0508) BP: 107/57 mmHg (04/15 0508) Pulse Rate: 75 (04/15 0508) Intake/Output from previous day: 04/14 0701 - 04/15 0700 In: 720 [P.O.:720] Out: -   Labs:  Recent Labs  06/18/12 1615 06/19/12 0500  WBC 9.3 6.4  HGB 15.2* 13.3  PLT 185 152  CREATININE 0.94 0.90   Estimated Creatinine Clearance: 62.3 ml/min (by C-G formula based on Cr of 0.9).   Microbiology: No results found for this or any previous visit (from the past 720 hour(s)).  Medical History: Past Medical History  Diagnosis Date  . Bilateral hearing loss   . Otitis media, chronic   . Urinary, incontinence, stress female   . Dyslipidemia   . Allergic rhinitis, seasonal   . Osteoarthritis cervical spine   . Osteoarthritis of hand     bilateral  . Alcohol abuse, in remission   . Depression with anxiety   . Colitis, ischemic 2012  . Sciatica   . Diverticulitis 2012  . Alcohol abuse   . Depression     Assessment: 48 yof presented 4/12 with c/o abd pain x 1 month and diarrhea.  CT abd/pelvis with colitis. Patient received one dose of Ciprofloxacin and then pharmacy to continue dosing.  Flagyl also ordered by MD.  Day #4 Cipro/Flagyl  Last labs from 4/13:  SCr stable/wnl, CrCl ~ 62 ml/min, and WBC wnl  Cdiff PCR pending stool sample  collection  Planning for flex sig per GI on 4/15 - follow up plans post-procedure.  Plan:   Continue Ciprofloxacin 400 mg IV q12h  Follow up renal function and culture results as available.   Lynann Beaver PharmD, BCPS Pager [phone] 06/21/2012 9:00 AM

## 2012-06-21 NOTE — Telephone Encounter (Signed)
Pt scheduled 06/21/12 for 48 hr holter monitor per Dr. Debby Bud, and appears to be in the hospital.

## 2012-06-21 NOTE — Progress Notes (Signed)
[facility] PROGRESS NOTE  [name] [name] ZOX:[id number] DOB: [birth year] DOA: 06/18/2012 PCP: Illene Regulus, MD  Assessment/Plan: #1 colitis Questionable etiology. Clinical improvement. Patient is status post flexible sigmoidoscopy which was done today. Patient has been started on a diet. Continue empiric Cipro and Flagyl. GI is following and appreciate input and recommendations.  #2 newly diagnosed type 2 diabetes Hemoglobin A1c is 10.6. CBGs have ranged from 171 to 306. Will stat Lantus at 10 units daily. Continue sliding scale insulin. On discharge with Januvia versus metformin.  #3 vaginitis Continue Terazol vaginal cream.  #4 bipolar disorder Continue Abilify.  Code Status: Full Family Communication: Updated patient and husband at bedside Disposition Plan: Home in medically stable in 1-2 days   Consultants:  Gastroenterology: Dr. Rhea Belton 06/20/2012  Procedures:  Flex sigmoidoscopy 06/21/2012  CT of the abdomen and pelvis 06/18/2012  Antibiotics:  IV Cipro 06/19/2012  IV Flagyl 06/19/2012  HPI/Subjective: Patient denies any diarrhea. Patient denies any abdominal pain. Patient stated she tolerated diet at lunchtime.  Objective: Filed Vitals:   06/21/12 1233 06/21/12 1406 06/21/12 1415 06/21/12 1604  BP: 119/79 104/64  109/74  Pulse: 82 74  85  Temp: 98.1 F (36.7 C) 98 F (36.7 C)  97.9 F (36.6 C)  TempSrc:  Oral  Oral  Resp: 20 16  18   Height:      Weight:      SpO2: 88% 99% 96% 98%    Intake/Output Summary (Last 24 hours) at 06/21/12 1950 Last data filed at 06/21/12 1751  Gross per 24 hour  Intake    480 ml  Output    750 ml  Net   -270 ml   Filed Weights   06/18/12 1900  Weight: 81 kg (178 lb 9.2 oz)    Exam:   General:  nad  Cardiovascular: RRR  Respiratory: CTAB  Abdomen: Soft, nontender, nondistended, positive bowel sounds.  Musculoskeletal: 5/5 bue STRENGTH, 5/5 BLE strength   Data Reviewed: Basic Metabolic  Panel:  Recent Labs Lab 06/18/12 1615 06/19/12 0500  NA 129* 132*  K 4.4 3.8  CL 92* 99  CO2 24 25  GLUCOSE 535* 272*  BUN 12 8  CREATININE 0.94 0.90  CALCIUM 10.0 8.4   Liver Function Tests:  Recent Labs Lab 06/18/12 1615 06/19/12 0500  AST 43* 33  ALT 53* 44*  ALKPHOS 179* 139*  BILITOT 0.4 0.4  PROT 6.8 5.6*  ALBUMIN 3.4* 2.9*   No results found for this basename: LIPASE, AMYLASE,  in the last 168 hours No results found for this basename: AMMONIA,  in the last 168 hours CBC:  Recent Labs Lab 06/18/12 1615 06/19/12 0500  WBC 9.3 6.4  NEUTROABS 7.1 4.0  HGB 15.2* 13.3  HCT 44.9 39.2  MCV 87.7 88.3  PLT 185 152   Cardiac Enzymes: No results found for this basename: CKTOTAL, CKMB, CKMBINDEX, TROPONINI,  in the last 168 hours BNP (last 3 results)  Recent Labs  11/27/11 1521  PROBNP 64.7   CBG:  Recent Labs Lab 06/20/12 2140 06/21/12 0749 06/21/12 1035 06/21/12 1247 06/21/12 1650  GLUCAP 255* 306* 241* 171* 213*    No results found for this or any previous visit (from the past 240 hour(s)).   Studies: No results found.  Scheduled Meds: . ARIPiprazole  2 mg Oral q morning - 10a  . buprenorphine  10 mcg Transdermal Weekly  . ciprofloxacin  400 mg Intravenous Q12H  . cloNIDine  0.2 mg Oral QHS  . clotrimazole  1 Applicatorful Vaginal QHS  . diazepam  5 mg Oral BID  . enoxaparin (LOVENOX) injection  40 mg Subcutaneous Q24H  . escitalopram  10 mg Oral QPM  . insulin aspart  0-15 Units Subcutaneous TID WC  . insulin glargine  5 Units Subcutaneous QHS  . levothyroxine  75 mcg Oral QAC breakfast  . lithium carbonate  450 mg Oral QPM  . metronidazole  500 mg Intravenous Q8H  . QUEtiapine  100 mg Oral 5 X Daily  . rOPINIRole  0.5 mg Oral QHS   Continuous Infusions: . sodium chloride 20 mL/hr at 06/21/12 1215    Active Problems:   Hyperglycemia   Hypertension   Bipolar 1 disorder    Time spent: > 35 MINS    [facility]  [facility] Pager [phone]. If 7PM-7AM, please contact night-coverage at [email or web address], password [facility] 06/21/2012, 7:50 PM  LOS: 3 days

## 2012-06-21 NOTE — Telephone Encounter (Signed)
[name] [name] with [facility] calls [phone]. Pt was scheduled to go to [facility] to receive 48 holter monitor but she is currently in the hospital at [facility]. Is she going to need this upon getting out of the hospital? Or can this order just be cancelled? Please advise.

## 2012-06-21 NOTE — Op Note (Signed)
[facility] [address] [place], [address]   FLEXIBLE SIGMOIDOSCOPY PROCEDURE REPORT  PATIENT: [name] [name], [name] [name]  MR#: [id number] BIRTHDATE: 03/06/1946 , 66  yrs. old GENDER: Female ENDOSCOPIST: Beverley Fiedler, MD REFERRED BY: [facility] PROCEDURE DATE:  06/21/2012 PROCEDURE:   Sigmoidoscopy with biopsy ASA CLASS:   Class III INDICATIONS:abdominal pain, generalized .   unexplained diarrhea. an abnormal CT. MEDICATIONS: Versed 7 mg IV, Fentanyl 75 mcg IV, and Diphenhydramine (Benadryl) 25 mg IV  DESCRIPTION OF PROCEDURE:   After the risks benefits and alternatives of the procedure were thoroughly explained, informed consent was obtained.  revealed no abnormalities of the rectum. The Pentax peds colonoscope [id number] was introduced through the anus  and advanced to the descending colon where solid stool was encountered. No adverse events experienced.   The quality of the prep was adequate.  The instrument was then slowly withdrawn as the mucosa was fully examined.   COLON FINDINGS:  There were 2 very small areas of very mild erythema in the distal sigmoid colon, nonspecific in nature. The colonic mucosa appeared normal in the descending colon, sigmoid colon, and rectum.  There was no evidence of inflammatory bowel disease. Solid stool was encountered in the proximal descending colon.  No evidence for bleeding. Random biopsies performed in the left colon along with targeted biopsies at the area of very mild erythema. Mild diverticulosis was noted in the sigmoid colon.  No bleeding was noted from the diverticulosis.  Retroflexed views revealed no abnormalities.    The scope was then withdrawn from the patient and the procedure terminated.  COMPLICATIONS: There were no complications.  ENDOSCOPIC IMPRESSION: 1.   Two very small patches of mild erythema, nonspecific.  Biopsies obtained. 2.   The colonic mucosa appeared normal in the descending  colon, sigmoid colon, and rectum; random biopsies. 3.   Mild diverticulosis was noted in the sigmoid colon RECOMMENDATIONS: 1.  Await biopsy results 2.  Okay for regular diet as tolerated 3.  Office follow-up with Dr.  Juanda Chance  eSigned:  Beverley Fiedler, MD 06/21/2012 11:58 AM   CC:The Patient [name], MD Jacques Navy, MD  PATIENT NAME:  [name] [name], [name] [name] MR#: [id number]

## 2012-06-22 ENCOUNTER — Encounter (HOSPITAL_COMMUNITY): Payer: Self-pay | Admitting: Internal Medicine

## 2012-06-22 LAB — COMPREHENSIVE METABOLIC PANEL
ALT: 46 U/L — ABNORMAL HIGH (ref 0–35)
AST: 42 U/L — ABNORMAL HIGH (ref 0–37)
Albumin: 3.1 g/dL — ABNORMAL LOW (ref 3.5–5.2)
Alkaline Phosphatase: 142 U/L — ABNORMAL HIGH (ref 39–117)
BUN: 8 mg/dL (ref 6–23)
CO2: 23 mEq/L (ref 19–32)
Calcium: 9.2 mg/dL (ref 8.4–10.5)
Chloride: 98 mEq/L (ref 96–112)
Creatinine, Ser: 0.82 mg/dL (ref 0.50–1.10)
GFR calc Af Amer: 86 mL/min — ABNORMAL LOW (ref >90)
GFR calc non Af Amer: 74 mL/min — ABNORMAL LOW (ref >90)
Glucose, Bld: 257 mg/dL — ABNORMAL HIGH (ref 70–99)
Potassium: 4.3 mEq/L (ref 3.5–5.1)
Sodium: 130 mEq/L — ABNORMAL LOW (ref 135–145)
Total Bilirubin: 0.5 mg/dL (ref 0.3–1.2)
Total Protein: 6.1 g/dL (ref 6.0–8.3)

## 2012-06-22 LAB — GLUCOSE, CAPILLARY
Glucose-Capillary: 240 mg/dL — ABNORMAL HIGH (ref 70–99)
Glucose-Capillary: 234 mg/dL — ABNORMAL HIGH (ref 70–99)
Glucose-Capillary: 248 mg/dL — ABNORMAL HIGH (ref 70–99)
Glucose-Capillary: 311 mg/dL — ABNORMAL HIGH (ref 70–99)

## 2012-06-22 LAB — MAGNESIUM: Magnesium: 1.8 mg/dL (ref 1.5–2.5)

## 2012-06-22 MED ORDER — INSULIN GLARGINE 100 UNIT/ML ~~LOC~~ SOLN: 10.0000 [IU] | Freq: Every day | SUBCUTANEOUS | Status: DC

## 2012-06-22 MED ORDER — INSULIN PEN STARTER KIT
1.0000 | Freq: Once | Status: AC
  Administered 2012-06-22: 1
  Filled 2012-06-22: qty 1

## 2012-06-22 MED ORDER — SODIUM CHLORIDE 0.9 % IV SOLN
INTRAVENOUS | Status: DC
Start: 2012-06-22 — End: 2012-06-24
  Administered 2012-06-22: 16:00:00 via INTRAVENOUS
  Administered 2012-06-23: 75 mL/h via INTRAVENOUS
  Administered 2012-06-24: 03:00:00 via INTRAVENOUS

## 2012-06-22 MED ORDER — INSULIN PEN NEEDLE 32G X 4 MM MISC: 1.0000 | Freq: Every day | Status: DC

## 2012-06-22 MED ORDER — INSULIN GLARGINE 100 UNIT/ML ~~LOC~~ SOLN
10.0000 [IU] | Freq: Every day | SUBCUTANEOUS | Status: DC
  Administered 2012-06-22 – 2012-06-23 (×2): 10 [IU] via SUBCUTANEOUS
  Filled 2012-06-22 (×3): qty 0.1

## 2012-06-22 NOTE — Progress Notes (Signed)
[facility] PROGRESS NOTE  [name] [name] ZOX:[id number] DOB: [birth year] DOA: 06/18/2012 PCP: Illene Regulus, MD  Assessment/Plan: #1 colitis Questionable etiology. Clinical improvement. Patient c/o abdominal cramping. Patient is status post flexible sigmoidoscopy which was done yesterday. Patient tolerating current diet. Continue empiric Cipro and Flagyl. GI is following and appreciate input and recommendations. F/U with GI as outpatient.  #2 newly diagnosed type 2 diabetes Hemoglobin A1c is 10.6. CBGs have ranged from 202 to 248. Increase Lantus to 10 units daily. Continue sliding scale insulin. On discharge will d/c with lantus pens 10 units daily with outpatient f/u. Patient has been set up with outpatient diabetes education.  #3 vaginitis Continue Terazol vaginal cream.  #4 bipolar disorder Continue Abilify.  Code Status: Full Family Communication: Updated patient and husband at bedside Disposition Plan: Home when medically stable, hopefully tomorrow.   Consultants:  Gastroenterology: Dr. Rhea Belton 06/20/2012  Procedures:  Flex sigmoidoscopy 06/21/2012  CT of the abdomen and pelvis 06/18/2012  Antibiotics:  IV Cipro 06/19/2012  IV Flagyl 06/19/2012  HPI/Subjective: Patient denies any diarrhea. Patient c/o abdominal cramping. Patient states feeling very exhaused and fatigued. Patient tolerating current diet.   Objective: Filed Vitals:   06/21/12 2200 06/22/12 0604 06/22/12 0941 06/22/12 1308  BP: 128/77 119/73 126/69 119/66  Pulse: 81 79 85 98  Temp: 98 F (36.7 C) 97.9 F (36.6 C) 98.2 F (36.8 C) 97.9 F (36.6 C)  TempSrc: Oral Oral Oral Oral  Resp: 20 18 18 20   Height:      Weight:      SpO2: 95% 98% 95% 97%    Intake/Output Summary (Last 24 hours) at 06/22/12 1354 Last data filed at 06/22/12 0944  Gross per 24 hour  Intake    240 ml  Output   2600 ml  Net  -2360 ml   Filed Weights   06/18/12 1900  Weight: 81 kg (178 lb 9.2 oz)     Exam:   General:  nad  Cardiovascular: RRR  Respiratory: CTAB  Abdomen: Soft, nontender, nondistended, positive bowel sounds.  Musculoskeletal: 5/5 bue STRENGTH, 5/5 BLE strength   Data Reviewed: Basic Metabolic Panel:  Recent Labs Lab 06/18/12 1615 06/19/12 0500 06/22/12 0800  NA 129* 132* 130*  K 4.4 3.8 4.3  CL 92* 99 98  CO2 24 25 23   GLUCOSE 535* 272* 257*  BUN 12 8 8   CREATININE 0.94 0.90 0.82  CALCIUM 10.0 8.4 9.2  MG  --   --  1.8   Liver Function Tests:  Recent Labs Lab 06/18/12 1615 06/19/12 0500 06/22/12 0800  AST 43* 33 42*  ALT 53* 44* 46*  ALKPHOS 179* 139* 142*  BILITOT 0.4 0.4 0.5  PROT 6.8 5.6* 6.1  ALBUMIN 3.4* 2.9* 3.1*   No results found for this basename: LIPASE, AMYLASE,  in the last 168 hours No results found for this basename: AMMONIA,  in the last 168 hours CBC:  Recent Labs Lab 06/18/12 1615 06/19/12 0500  WBC 9.3 6.4  NEUTROABS 7.1 4.0  HGB 15.2* 13.3  HCT 44.9 39.2  MCV 87.7 88.3  PLT 185 152   Cardiac Enzymes: No results found for this basename: CKTOTAL, CKMB, CKMBINDEX, TROPONINI,  in the last 168 hours BNP (last 3 results)  Recent Labs  11/27/11 1521  PROBNP 64.7   CBG:  Recent Labs Lab 06/21/12 1650 06/21/12 2236 06/22/12 0650 06/22/12 0802 06/22/12 1112  GLUCAP 213* 202* 240* 234* 248*    No results found for this or any previous  visit (from the past 240 hour(s)).   Studies: No results found.  Scheduled Meds: . ARIPiprazole  2 mg Oral q morning - 10a  . buprenorphine  10 mcg Transdermal Weekly  . ciprofloxacin  400 mg Intravenous Q12H  . cloNIDine  0.2 mg Oral QHS  . clotrimazole  1 Applicatorful Vaginal QHS  . diazepam  5 mg Oral BID  . enoxaparin (LOVENOX) injection  40 mg Subcutaneous Q24H  . escitalopram  10 mg Oral QPM  . Flexpen Starter Kit  1 kit Other Once  . insulin aspart  0-15 Units Subcutaneous TID WC  . insulin glargine  10 Units Subcutaneous QHS  . levothyroxine  75  mcg Oral QAC breakfast  . lithium carbonate  450 mg Oral QPM  . metronidazole  500 mg Intravenous Q8H  . QUEtiapine  100 mg Oral 5 X Daily  . rOPINIRole  0.5 mg Oral QHS   Continuous Infusions: . sodium chloride      Active Problems:   Hyperglycemia   Hypertension   Bipolar 1 disorder    Time spent: > 35 MINS    [facility]  [facility] Pager [phone]. If 7PM-7AM, please contact night-coverage at [email or web address], password [facility] 06/22/2012, 1:54 PM  LOS: 4 days

## 2012-06-22 NOTE — Progress Notes (Signed)
Patient has slept well all shift with no complaints. Pt is eager to go home and would like to know the status of her biopsy. Will continue to monitor patient.  MCCLAIN, Shanasia Ibrahim L

## 2012-06-22 NOTE — Progress Notes (Signed)
Inpatient Diabetes Program Recommendations  AACE/ADA: New Consensus Statement on Inpatient Glycemic Control (2013)  Target Ranges:  Prepandial:   less than 140 mg/dL      Peak postprandial:   less than 180 mg/dL (1-2 hours)      Critically ill patients:  140 - 180 mg/dL     Patient stated she would prefer to go home on insulin vs. Oral diabetes medications.  Spoke to Dr. Janee Morn- Dr. Janee Morn stated he will send patient home on Lantus insulin if she would prefer insulin.  Educated patient on insulin pen use at home.  Reviewed all steps if insulin pen including attachment of needle, 2-unit air shot, dialing up dose, giving injection, removing needle, disposal of sharps, storage of unused insulin, disposal of insulin etc.  Patient able to provide successful return demonstration.  Also reviewed troubleshooting with insulin pen.  MD to give patient Rxs for insulin pens and insulin pen needles.  Ordered insulin pen starter kit from pharmacy.  RN to continue insulin pen education with patient at beside before d/c.  Reviewed s/sxs of hypoglycemia with patient and how to treat at home.  Will follow. Ambrose Finland RN, MSN, CDE Diabetes Coordinator Inpatient Diabetes Program [phone]

## 2012-06-22 NOTE — Telephone Encounter (Signed)
Phone call to [facility] [phone] spoke to I believe she stated her name is [name] [name] to let her know if pt is on telemetry while inpatient there is no need for Holter upon being d/c.

## 2012-06-22 NOTE — Telephone Encounter (Signed)
She can pick up holter after d/c unless she is on telemetry while inpatient. If on telemetry now - no need for Holter Thx Thx

## 2012-06-23 LAB — BASIC METABOLIC PANEL
BUN: 7 mg/dL (ref 6–23)
CO2: 24 mEq/L (ref 19–32)
Calcium: 8.7 mg/dL (ref 8.4–10.5)
Chloride: 100 mEq/L (ref 96–112)
Creatinine, Ser: 0.83 mg/dL (ref 0.50–1.10)
GFR calc Af Amer: 85 mL/min — ABNORMAL LOW (ref >90)
GFR calc non Af Amer: 73 mL/min — ABNORMAL LOW (ref >90)
Glucose, Bld: 263 mg/dL — ABNORMAL HIGH (ref 70–99)
Potassium: 4.1 mEq/L (ref 3.5–5.1)
Sodium: 131 mEq/L — ABNORMAL LOW (ref 135–145)

## 2012-06-23 LAB — GLUCOSE, CAPILLARY
Glucose-Capillary: 223 mg/dL — ABNORMAL HIGH (ref 70–99)
Glucose-Capillary: 256 mg/dL — ABNORMAL HIGH (ref 70–99)
Glucose-Capillary: 263 mg/dL — ABNORMAL HIGH (ref 70–99)
Glucose-Capillary: 194 mg/dL — ABNORMAL HIGH (ref 70–99)
Glucose-Capillary: 229 mg/dL — ABNORMAL HIGH (ref 70–99)

## 2012-06-23 LAB — CBC
HCT: 41.2 % (ref 36.0–46.0)
Hemoglobin: 13.6 g/dL (ref 12.0–15.0)
MCH: 29.1 pg (ref 26.0–34.0)
MCHC: 33 g/dL (ref 30.0–36.0)
MCV: 88.2 fL (ref 78.0–100.0)
Platelets: 155 10*3/uL (ref 150–400)
RBC: 4.67 MIL/uL (ref 3.87–5.11)
RDW: 13 % (ref 11.5–15.5)
WBC: 8 10*3/uL (ref 4.0–10.5)

## 2012-06-23 MED ORDER — METRONIDAZOLE 500 MG PO TABS
500.0000 mg | ORAL_TABLET | Freq: Three times a day (TID) | ORAL | Status: DC
  Administered 2012-06-24 (×2): 500 mg via ORAL
  Filled 2012-06-23 (×4): qty 1

## 2012-06-23 MED ORDER — FLUTICASONE PROPIONATE 50 MCG/ACT NA SUSP
1.0000 | Freq: Every day | NASAL | Status: DC
  Administered 2012-06-23: 2 via NASAL
  Administered 2012-06-24: 1 via NASAL
  Filled 2012-06-23: qty 16

## 2012-06-23 MED ORDER — CIPROFLOXACIN HCL 500 MG PO TABS
500.0000 mg | ORAL_TABLET | Freq: Two times a day (BID) | ORAL | Status: DC
  Administered 2012-06-24: 500 mg via ORAL
  Filled 2012-06-23 (×3): qty 1

## 2012-06-23 NOTE — Progress Notes (Signed)
 Cardiac care called regarding patient stating that when she is discharged, she will need to follow up with them regarding a cardiac monitor.  Will notify patient.

## 2012-06-23 NOTE — Progress Notes (Signed)
[facility] PROGRESS NOTE  [name] [name] ZOX:[id number] DOB: 1945/05/24 DOA: 06/18/2012 PCP: Illene Regulus, MD  Assessment/Plan: #1 colitis Questionable etiology. Clinical improvement. Patient c/o abdominal cramping. Patient is status post flexible sigmoidoscopy which was done yesterday. Patient tolerating current diet. Continue empiric Cipro and Flagyl. F/U with GI as outpatient. -improving clinically, will change to oral abx beginning in am  #2 newly diagnosed type 2 diabetes Hemoglobin A1c is 10.6. CBGs have ranged from 202 to 248. Increase Lantus to 10 units daily. Continue sliding scale insulin. On discharge will d/c with lantus pens 10 units daily with outpatient f/u. Patient has been set up with outpatient diabetes education. - monitor BG today, if remain greater that 200 will plan increase to 15u lantus tomorrow and dc/ for outpt follow up.   #3 vaginitis Continue Terazol vaginal cream.  #4 bipolar disorder Continue Abilify.   #5 RUE IV infiltration -warm compresses and follow  Code Status: Full Family Communication: Updated patient at bedside Disposition Plan: Home when medically stable, hopefully tomorrow.   Consultants:  Gastroenterology: Dr. Rhea Belton 06/20/2012  Procedures:  Flex sigmoidoscopy 06/21/2012  CT of the abdomen and pelvis 06/18/2012  Antibiotics:  IV Cipro 06/19/2012  IV Flagyl 06/19/2012  HPI/Subjective: Patient denies any diarrhea. Patient c/o intermittent abdominal cramping, mostly after meals. Denies N/V/D Objective: Filed Vitals:   06/22/12 0941 06/22/12 1308 06/23/12 0535 06/23/12 1422  BP: 126/69 119/66 108/71 126/81  Pulse: 85 98 85 88  Temp: 98.2 F (36.8 C) 97.9 F (36.6 C) 97.7 F (36.5 C) 98.3 F (36.8 C)  TempSrc: Oral Oral Oral Oral  Resp: 18 20 18 20   Height:      Weight:      SpO2: 95% 97% 91% 92%    Intake/Output Summary (Last 24 hours) at 06/23/12 1624 Last data filed at 06/23/12 1548  Gross per 24 hour   Intake 1447.5 ml  Output   3125 ml  Net -1677.5 ml   Filed Weights   06/18/12 1900  Weight: 81 kg (178 lb 9.2 oz)    Exam:   General:  nad  Cardiovascular: RRR  Respiratory: CTAB  Abdomen: Soft, nontender, nondistended, positive bowel sounds.  Extremities: RUE with +2 edema around elbow- old IV site, no erythema and no drainage, mild tenderness, 5/5 BUE STRENGTH, 5/5 BLE strength   Data Reviewed: Basic Metabolic Panel:  Recent Labs Lab 06/18/12 1615 06/19/12 0500 06/22/12 0800 06/23/12 0500  NA 129* 132* 130* 131*  K 4.4 3.8 4.3 4.1  CL 92* 99 98 100  CO2 24 25 23 24   GLUCOSE 535* 272* 257* 263*  BUN 12 8 8 7   CREATININE 0.94 0.90 0.82 0.83  CALCIUM 10.0 8.4 9.2 8.7  MG  --   --  1.8  --    Liver Function Tests:  Recent Labs Lab 06/18/12 1615 06/19/12 0500 06/22/12 0800  AST 43* 33 42*  ALT 53* 44* 46*  ALKPHOS 179* 139* 142*  BILITOT 0.4 0.4 0.5  PROT 6.8 5.6* 6.1  ALBUMIN 3.4* 2.9* 3.1*   No results found for this basename: LIPASE, AMYLASE,  in the last 168 hours No results found for this basename: AMMONIA,  in the last 168 hours CBC:  Recent Labs Lab 06/18/12 1615 06/19/12 0500 06/23/12 0500  WBC 9.3 6.4 8.0  NEUTROABS 7.1 4.0  --   HGB 15.2* 13.3 13.6  HCT 44.9 39.2 41.2  MCV 87.7 88.3 88.2  PLT 185 152 155   Cardiac Enzymes: No results found  for this basename: CKTOTAL, CKMB, CKMBINDEX, TROPONINI,  in the last 168 hours BNP (last 3 results)  Recent Labs  11/27/11 1521  PROBNP 64.7   CBG:  Recent Labs Lab 06/21/12 2236 06/22/12 0650 06/22/12 0802 06/22/12 1112 06/22/12 1639  GLUCAP 202* 240* 234* 248* 311*    No results found for this or any previous visit (from the past 240 hour(s)).   Studies: No results found.  Scheduled Meds: . ARIPiprazole  2 mg Oral q morning - 10a  . buprenorphine  10 mcg Transdermal Weekly  . ciprofloxacin  400 mg Intravenous Q12H  . cloNIDine  0.2 mg Oral QHS  . clotrimazole  1  Applicatorful Vaginal QHS  . diazepam  5 mg Oral BID  . enoxaparin (LOVENOX) injection  40 mg Subcutaneous Q24H  . escitalopram  10 mg Oral QPM  . insulin aspart  0-15 Units Subcutaneous TID WC  . insulin glargine  10 Units Subcutaneous QHS  . levothyroxine  75 mcg Oral QAC breakfast  . lithium carbonate  450 mg Oral QPM  . metronidazole  500 mg Intravenous Q8H  . QUEtiapine  100 mg Oral 5 X Daily  . rOPINIRole  0.5 mg Oral QHS   Continuous Infusions: . sodium chloride 75 mL/hr (06/23/12 1039)    Active Problems:   Hyperglycemia   Hypertension   Bipolar 1 disorder    Time spent: > 25 MINS    [name] [name] C  [facility] Pager [phone]. If 7PM-7AM, please contact night-coverage at [email or web address], password [facility] 06/23/2012, 4:24 PM  LOS: 5 days

## 2012-06-23 NOTE — Progress Notes (Signed)
Inpatient Diabetes Program Recommendations  AACE/ADA: New Consensus Statement on Inpatient Glycemic Control (2013)  Target Ranges:  Prepandial:   less than 140 mg/dL      Peak postprandial:   less than 180 mg/dL (1-2 hours)      Critically ill patients:  140 - 180 mg/dL   Reason for Assessment:  Hyperglycemia  Results for [name] [name], [name] [name] (MRN [id number]) as of 06/23/2012 09:37  Ref. Range 06/22/2012 08:00 06/23/2012 05:00  Glucose Latest Range: 70-99 mg/dL 829 (H) 562 (H)   FBS still running high.  Inpatient Diabetes Program Recommendations Insulin - Basal: Consider increasing Lantus to 14 units QHS  Note: Will follow.  Thank you. Ailene Ards, RD, LDN, CDE Inpatient Diabetes Coordinator [phone]

## 2012-06-24 LAB — GLUCOSE, CAPILLARY
Glucose-Capillary: 207 mg/dL — ABNORMAL HIGH (ref 70–99)
Glucose-Capillary: 212 mg/dL — ABNORMAL HIGH (ref 70–99)

## 2012-06-24 MED ORDER — OXYCODONE-ACETAMINOPHEN 5-325 MG PO TABS: 1.0000 | ORAL_TABLET | ORAL | Status: DC | PRN

## 2012-06-24 MED ORDER — CIPROFLOXACIN HCL 500 MG PO TABS: 500.0000 mg | ORAL_TABLET | Freq: Two times a day (BID) | ORAL | Status: DC

## 2012-06-24 MED ORDER — METRONIDAZOLE 500 MG PO TABS: 500.0000 mg | ORAL_TABLET | Freq: Three times a day (TID) | ORAL | Status: DC

## 2012-06-24 MED ORDER — INSULIN GLARGINE 100 UNIT/ML ~~LOC~~ SOLN: 13.0000 [IU] | Freq: Every day | SUBCUTANEOUS | Status: DC

## 2012-06-24 MED ORDER — INSULIN PEN NEEDLE 32G X 4 MM MISC: 1.0000 | Freq: Every day | Status: DC

## 2012-06-24 NOTE — Discharge Summary (Signed)
Physician Discharge Summary  [name] [name] ZOX:[id number] DOB: [birth year] DOA: 06/18/2012  PCP: Illene Regulus, MD  Admit date: 06/18/2012 Discharge date: 06/24/2012  Time spent: >30 minutes  Recommendations for Outpatient Follow-up:      Follow-up Information   Follow up with Illene Regulus, MD. (in 1-2weeks, call for appt)    Contact information:   [address] [place] [address] [phone]      Dr.Pyrtle/Brodie- [facility], call for appointment upon discharge  Discharge Diagnoses:  Active Problems:   Hyperglycemia   Hypertension   Bipolar 1 disorder   Discharge Condition: Improved/stable  Diet recommendation: Modified carbohydrate  Filed Weights   06/18/12 1900  Weight: 81 kg (178 lb 9.2 oz)    History of present illness:   [name] [name] is a 67 y.o. female previous history of ischemic colitis, hypertension, hypothyroidism, bipolar disorder presents with complaints of abdominal pain worsening over the last one week. Patient states that she's been having abdominal pain in the both lower quadrants over the last one month. Over the last one week it has gradually worsened. The pain is mostly in the lower quadrants and dull aching constant increased on eating. Patient also has having diarrhea which is nonbloody and increased on eating. Denies any vomiting but does have mild nausea. Denies having used any recent antibiotics except fluconazole today for vaginal yeast. CT abdomen and pelvis done shows features concerning for colitis and has been admitted for further management.     Hospital Course:  #1 colitis  Questionable etiology. On admission patient had a CT scan of the abdomen and pelvis that showed Colitis involving the mid and distal transverse colon, descending colon, sigmoid colon, and rectum. She was was started empirically on Cipro and Flagyl Clinical improvement. GI was consulted and a flexible sigmoidoscopy was done on 4/15 and showed 2 very small  patches of mild erythema, nonspecific and biopsies were obtained. The colonic mucosa was noted to appear normal in the descending sigmoid colon and rectum. Mild diverticulosis was noted in the sigmoid colon. Patient started on a diet which are she's been tolerating. She still has some intermittent are cramping but overall is clinically improved has had no diarrhea no nausea vomiting. She'll be discharged at this time on oral antibiotics and is to follow up outpatient with her PCP and GI. #2 newly diagnosed type 2 diabetes  Hemoglobin A1c is 10.6. CBGs have ranged from 202 to 248. Increase Lantus to 10 units daily and SSI while in the hospital. She states extensive diabetes teaching while in the hospital, and will be discharged with lantus pens 13 units daily with outpatient f/u. Patient has been set up with outpatient diabetes education. She is to keep a log of her blood sugars, follow up with her PCP for further adjustment of her insulin as clinically appropriate for optimal blood glucose control.  #3 vaginitis  Continue Terazol vaginal cream.  #4 bipolar disorder  Continue Abilify.  #5 RUE IV infiltration  -She is to continue warm compresses and elevation of her right arm follow up outpatient. She has no evidence of infection at this time.  Consultants:  Gastroenterology: Dr. Rhea Belton 06/20/2012 Procedures:  Flex sigmoidoscopy 06/21/2012  CT of the abdomen and pelvis 06/18/2012     Discharge Exam: Filed Vitals:   06/23/12 1422 06/23/12 2120 06/24/12 0506 06/24/12 1358  BP: 126/81 136/81 140/86 123/65  Pulse: 88 89 83 91  Temp: 98.3 F (36.8 C) 98.4 F (36.9 C)  98 F (36.7 C)  TempSrc: Oral Oral Oral Oral  Resp: 20 20  18   Height:      Weight:      SpO2: 92% 92% 95% 93%     Discharge Instructions  Discharge Orders   Future Appointments Provider Department Dept Phone   06/29/2012 10:30 AM [facility] Main Office [place]) [phone]   Future Orders  Complete By Expires     Ambulatory referral to Nutrition and Diabetic Education  As directed     Comments:      Diagnosed with DM this admission.  A1c 10.6% (06/18/12).  PCP is Dr. Debby Bud.    Diet - low sodium heart healthy  As directed     Diet Carb Modified  As directed     Increase activity slowly  As directed     Increase activity slowly  As directed         Medication List    STOP taking these medications       fluconazole 150 MG tablet  Commonly known as:  DIFLUCAN      TAKE these medications       ABILIFY 2 MG tablet  Generic drug:  ARIPiprazole  Take 2 mg by mouth every morning.     buprenorphine 10 MCG/HR Ptwk  Commonly known as:  BUTRANS - dosed mcg/hr  Place 1 patch (10 mcg total) onto the skin once a week.     ciprofloxacin 500 MG tablet  Commonly known as:  CIPRO  Take 1 tablet (500 mg total) by mouth 2 (two) times daily.     cloNIDine 0.1 MG tablet  Commonly known as:  CATAPRES  Take 0.2 mg by mouth at bedtime.     diazepam 5 MG tablet  Commonly known as:  VALIUM  Take 5 mg by mouth 2 (two) times daily.     escitalopram 10 MG tablet  Commonly known as:  LEXAPRO  Take 10 mg by mouth every evening.     hyoscyamine 0.125 MG SL tablet  Commonly known as:  LEVSIN/SL  Place 1 tablet (0.125 mg total) under the tongue every 4 (four) hours as needed for cramping.     ibuprofen 200 MG tablet  Commonly known as:  ADVIL,MOTRIN  Take 800 mg by mouth every 6 (six) hours as needed. For pain/fever     insulin glargine 100 UNIT/ML injection  Commonly known as:  LANTUS  Inject 0.13 mLs (13 Units total) into the skin at bedtime.     Insulin Pen Needle 32G X 4 MM Misc  1 each by Does not apply route at bedtime.     LEVOTHROID 75 MCG tablet  Generic drug:  levothyroxine  Take 75 mcg by mouth daily before breakfast.     lithium carbonate 300 MG capsule  Take 450 mg by mouth every evening.     metroNIDAZOLE 500 MG tablet  Commonly known as:  FLAGYL  Take 1  tablet (500 mg total) by mouth every 8 (eight) hours.     MINIVELLE 0.075 MG/24HR  Generic drug:  estradiol  Place 1 patch onto the skin 2 (two) times a week.     oxyCODONE-acetaminophen 5-325 MG per tablet  Commonly known as:  ROXICET  Take 1 tablet by mouth every 4 (four) hours as needed for pain.     QUEtiapine 100 MG tablet  Commonly known as:  SEROQUEL  Take 100 mg by mouth 5 (five) times daily.     rOPINIRole 0.5 MG tablet  Commonly  known as:  REQUIP  Take 0.5 mg by mouth at bedtime.           Follow-up Information   Follow up with Illene Regulus, MD. (in 1-2weeks, call for appt)    Contact information:   [address] [place] [address] [phone]        The results of significant diagnostics from this hospitalization (including imaging, microbiology, ancillary and laboratory) are listed below for reference.    Significant Diagnostic Studies: Ct Abdomen Pelvis W Contrast  06/18/2012  *RADIOLOGY REPORT*  Clinical Data: 58-month history of lower abdominal pain, predominately left lower quadrant, and intermittent diarrhea. Prior history of colitis.  CT ABDOMEN AND PELVIS WITH CONTRAST  Technique:  Multidetector CT imaging of the abdomen and pelvis was performed following the standard protocol during bolus administration of intravenous contrast.  Contrast:  100 ml Omnipaque-300 IV. Oral contrast was also administered.  Comparison: CT abdomen pelvis 12/25/2010.  Findings: Chronic moderate to severe hydronephrosis involving the right kidney without associated ureteral dilation, consistent with a congenital UPJ stenosis, unchanged.  Mild cortical thinning involving the right kidney.  No evidence of obstruction of the right upper urinary tract, as there is symmetric contrast excretion by both kidneys.  Normal-appearing left kidney.  Severe diffuse hepatic steatosis without focal hepatic parenchymal abnormality; focal sparing surrounding the gallbladder.  Normal spleen,  pancreas, adrenal glands, and gallbladder.  No biliary ductal dilation.  No visible aorto-iliofemoral atherosclerosis. Patent visceral arteries.  No significant lymphadenopathy.  Stomach normal in appearance, filled with food.   Normal-appearing small bowel.  Wall thickening with mucosal enhancement involving the mid and distal transverse colon, descending colon, sigmoid colon, and rectum.  Ascending colon and proximal transverse colon not involved.  Scattered sigmoid colon diverticula.  Appendix not clearly visualized, but no pericecal inflammation.  No ascites. Small periumbilical hernia containing fat.  Enhancement of a several centimeter segment of the distal left ureteral wall, adjacent to the inflamed sigmoid colon, therefore likely secondarily involved. Urinary bladder unremarkable.  Uterus surgically absent.  No adnexal masses or free pelvic fluid. Numerous pelvic phleboliths.  Bone window images demonstrate lower thoracic spondylosis, mild degenerative changes involving the facet joints the lower lumbar spine, and mild degenerative changes in both hips.  Visualized lung bases clear apart from the expected dependent atelectasis posteriorly.  Heart size normal.  IMPRESSION:  1.  . 2.  Secondary inflammation of the distal left ureter as it courses alongside the sigmoid colon. 3.  Severe diffuse hepatic steatosis. 4.  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 5.  Sigmoid colon diverticulosis without evidence of acute diverticulitis.   Original Report Authenticated By: Hulan Saas, M.D.     Microbiology: No results found for this or any previous visit (from the past 240 hour(s)).   Labs: Basic Metabolic Panel:  Recent Labs Lab 06/18/12 1615 06/19/12 0500 06/22/12 0800 06/23/12 0500  NA 129* 132* 130* 131*  K 4.4 3.8 4.3 4.1  CL 92* 99 98 100  CO2 24 25 23 24   GLUCOSE 535* 272* 257* 263*  BUN 12 8 8 7   CREATININE 0.94 0.90 0.82 0.83  CALCIUM 10.0 8.4 9.2 8.7  MG  --    --  1.8  --    Liver Function Tests:  Recent Labs Lab 06/18/12 1615 06/19/12 0500 06/22/12 0800  AST 43* 33 42*  ALT 53* 44* 46*  ALKPHOS 179* 139* 142*  BILITOT 0.4 0.4 0.5  PROT 6.8 5.6* 6.1  ALBUMIN 3.4*  2.9* 3.1*   No results found for this basename: LIPASE, AMYLASE,  in the last 168 hours No results found for this basename: AMMONIA,  in the last 168 hours CBC:  Recent Labs Lab 06/18/12 1615 06/19/12 0500 06/23/12 0500  WBC 9.3 6.4 8.0  NEUTROABS 7.1 4.0  --   HGB 15.2* 13.3 13.6  HCT 44.9 39.2 41.2  MCV 87.7 88.3 88.2  PLT 185 152 155   Cardiac Enzymes: No results found for this basename: CKTOTAL, CKMB, CKMBINDEX, TROPONINI,  in the last 168 hours BNP: BNP (last 3 results)  Recent Labs  11/27/11 1521  PROBNP 64.7   CBG:  Recent Labs Lab 06/23/12 1113 06/23/12 1619 06/23/12 2114 06/24/12 0722 06/24/12 1115  GLUCAP 263* 194* 229* 207* 212*       Signed:  Azam Gervasi C  [facility] 06/24/2012, 3:58 PM

## 2012-06-24 NOTE — Progress Notes (Signed)
Patient discharged to home.  Patient's husband at bedside.Reviewed discharge instructions with patient and her husband.  No further questions at this time.  Home health services set up for patient for visiting RN.  IV in left wrist removed.  Patient escorted to lobby via wheelchair.  Patient discharged.

## 2012-06-24 NOTE — Progress Notes (Signed)
Met with pt to discuss d/c planning. She is newly diagnosed with DM and seems anxious about Insulin administration. Pt has received teaching here but needs reinforcement at home by [facility]. She has chosen [facility] to provide the services.   Pt would like her husband to be present when the diabetic coordinator meets with her today. I informed her bedside RN of this.  Algernon Huxley RN, BSN  [phone]

## 2012-06-24 NOTE — Progress Notes (Signed)
ANTIBIOTIC CONSULT NOTE - Follow up  Pharmacy Consult for Ciprofloxacin Indication: Colitis  Allergies  Allergen Reactions  . Azithromycin Nausea And Vomiting    All mycins  . Erythromycin Nausea And Vomiting  . Codeine Other (See Comments)    Feeling weird   . Neurontin (Gabapentin) Other (See Comments)    Reaction=unknown  . Oxycodone Other (See Comments)    Reaction=unknown  . Procaine Hcl Other (See Comments)    Reaction=unknown  . Sulfur Other (See Comments)    Reaction=unknown    Patient Measurements: Height: 5\' 2"  (157.5 cm) Weight: 178 lb 9.2 oz (81 kg) IBW/kg (Calculated) : 50.1  Vital Signs: Temp: 98.4 F (36.9 C) (04/17 2120) Temp src: Oral (04/18 0506) BP: 140/86 mmHg (04/18 0506) Pulse Rate: 83 (04/18 0506) Intake/Output from previous day: 04/17 0701 - 04/18 0700 In: 5276.9 [P.O.:720; I.V.:1656.9; IV Piggyback:2900] Out: 2000 [Urine:2000]  Labs:  Recent Labs  06/22/12 0800 06/23/12 0500  WBC  --  8.0  HGB  --  13.6  PLT  --  155  CREATININE 0.82 0.83   Estimated Creatinine Clearance: 67.6 ml/min (by C-G formula based on Cr of 0.83).   Microbiology: No results found for this or any previous visit (from the past 720 hour(s)).  Medical History: Past Medical History  Diagnosis Date  . Bilateral hearing loss   . Otitis media, chronic   . Urinary, incontinence, stress female   . Dyslipidemia   . Allergic rhinitis, seasonal   . Osteoarthritis cervical spine   . Osteoarthritis of hand     bilateral  . Alcohol abuse, in remission   . Colitis, ischemic 2012  . Sciatica   . Diverticulitis 2012  . Alcohol abuse   . Depression with anxiety   . Depression     Assessment: 89 yof presented 4/12 with c/o abd pain x 1 month and diarrhea.  CT abd/pelvis with colitis. Patient received one dose of Ciprofloxacin and then pharmacy to continue dosing.  Flagyl also ordered by MD.  Day #7 Cipro/Flagyl - changed to PO on 4/18  Labs stabls  Follow up  with GI as outpatient  Plan:  Continue current PO Cipro dosing What is plan for total length of therapy of antibiotics? Will sign off  Hessie Knows, PharmD, BCPS Pager [phone] 06/24/2012 8:26 AM

## 2012-06-24 NOTE — Progress Notes (Signed)
Called by RN to come speak with patient and patient's husband.  Patient is to be d/c'd home today.  Has newly diagnosed diabetes.  Spent time with patient earlier this week reviewing basic diabetes information and basic diabetes home care (see note from 04/14 and 04/16).  Patient apparently very anxious and needs insulin teaching reinforcement.    Upon arrival to room, RN showing patient and her husband how to draw up and give insulin using vial and syringe method.  Patient was successful with this.  Reviewed CBG goals with patient and reminded patient to check her CBGs QD in the AM before breakfast and also 2-3 times per week at various times.  Reminded pt to record all CBG results for Dr. Debby Bud.    Educated patient and spouse on insulin pen use at home.  Reviewed contents of insulin flexpen starter kit.  Reviewed all steps if insulin pen including attachment of needle, 2-unit air shot, dialing up dose, giving injection, removing needle, disposal of sharps, storage of unused insulin, disposal of insulin etc.  Patient able to provide successful return demonstration with significant amount of prompting.  Also reviewed troubleshooting with insulin pen.  MD to give patient Rxs for insulin pens and insulin pen needles.  Will follow. Ambrose Finland RN, MSN, CDE Diabetes Coordinator Inpatient Diabetes Program [phone]

## 2012-06-27 ENCOUNTER — Encounter: Payer: Self-pay | Admitting: Internal Medicine

## 2012-06-27 ENCOUNTER — Encounter (HOSPITAL_BASED_OUTPATIENT_CLINIC_OR_DEPARTMENT_OTHER): Payer: Self-pay | Admitting: *Deleted

## 2012-06-27 ENCOUNTER — Encounter: Payer: Self-pay | Admitting: *Deleted

## 2012-06-27 LAB — GLUCOSE, CAPILLARY: Glucose-Capillary: 186 mg/dL — ABNORMAL HIGH (ref 70–99)

## 2012-06-28 ENCOUNTER — Encounter: Payer: Self-pay | Admitting: *Deleted

## 2012-06-29 ENCOUNTER — Encounter (INDEPENDENT_AMBULATORY_CARE_PROVIDER_SITE_OTHER): Payer: BC Managed Care – PPO

## 2012-06-29 ENCOUNTER — Telehealth: Payer: Self-pay | Admitting: *Deleted

## 2012-06-29 DIAGNOSIS — R000 Tachycardia, unspecified: Secondary | ICD-10-CM

## 2012-06-29 NOTE — Telephone Encounter (Signed)
48 hr holter monitor placed on Pt 06/29/12 TK 

## 2012-06-30 ENCOUNTER — Encounter: Payer: Self-pay | Admitting: *Deleted

## 2012-07-04 ENCOUNTER — Other Ambulatory Visit (INDEPENDENT_AMBULATORY_CARE_PROVIDER_SITE_OTHER): Payer: BC Managed Care – PPO

## 2012-07-04 ENCOUNTER — Ambulatory Visit (INDEPENDENT_AMBULATORY_CARE_PROVIDER_SITE_OTHER): Payer: BC Managed Care – PPO | Admitting: Internal Medicine

## 2012-07-04 ENCOUNTER — Encounter: Payer: Self-pay | Admitting: Internal Medicine

## 2012-07-04 VITALS — BP 114/80 | HR 95 | Temp 98.0°F | Resp 16 | Wt 173.1 lb

## 2012-07-04 DIAGNOSIS — E785 Hyperlipidemia, unspecified: Secondary | ICD-10-CM

## 2012-07-04 DIAGNOSIS — E781 Pure hyperglyceridemia: Secondary | ICD-10-CM | POA: Insufficient documentation

## 2012-07-04 DIAGNOSIS — M797 Fibromyalgia: Secondary | ICD-10-CM

## 2012-07-04 DIAGNOSIS — R000 Tachycardia, unspecified: Secondary | ICD-10-CM

## 2012-07-04 DIAGNOSIS — I10 Essential (primary) hypertension: Secondary | ICD-10-CM

## 2012-07-04 DIAGNOSIS — F319 Bipolar disorder, unspecified: Secondary | ICD-10-CM

## 2012-07-04 DIAGNOSIS — R232 Flushing: Secondary | ICD-10-CM

## 2012-07-04 DIAGNOSIS — IMO0001 Reserved for inherently not codable concepts without codable children: Secondary | ICD-10-CM

## 2012-07-04 DIAGNOSIS — K5289 Other specified noninfective gastroenteritis and colitis: Secondary | ICD-10-CM

## 2012-07-04 DIAGNOSIS — E559 Vitamin D deficiency, unspecified: Secondary | ICD-10-CM

## 2012-07-04 DIAGNOSIS — E119 Type 2 diabetes mellitus without complications: Secondary | ICD-10-CM

## 2012-07-04 LAB — LIPID PANEL
Cholesterol: 257 mg/dL — ABNORMAL HIGH (ref 0–200)
HDL: 40.6 mg/dL (ref >39.00)
Total CHOL/HDL Ratio: 6
Triglycerides: 505 mg/dL — ABNORMAL HIGH (ref 0.0–149.0)
VLDL: 101 mg/dL — ABNORMAL HIGH (ref 0.0–40.0)

## 2012-07-04 LAB — BASIC METABOLIC PANEL
BUN: 14 mg/dL (ref 6–23)
CO2: 27 mEq/L (ref 19–32)
Calcium: 10.2 mg/dL (ref 8.4–10.5)
Chloride: 99 mEq/L (ref 96–112)
Creatinine, Ser: 0.9 mg/dL (ref 0.4–1.2)
GFR: 70.51 mL/min (ref >60.00)
Glucose, Bld: 145 mg/dL — ABNORMAL HIGH (ref 70–99)
Potassium: 4.1 mEq/L (ref 3.5–5.1)
Sodium: 135 mEq/L (ref 135–145)

## 2012-07-04 LAB — LDL CHOLESTEROL, DIRECT: Direct LDL: 180.6 mg/dL

## 2012-07-04 MED ORDER — COLESEVELAM HCL 3.75 G PO PACK: 1.0000 | PACK | Freq: Every day | ORAL | Status: DC

## 2012-07-04 MED ORDER — SITAGLIP PHOS-METFORMIN HCL ER 100-1000 MG PO TB24: 1.0000 | ORAL_TABLET | Freq: Every day | ORAL | Status: DC

## 2012-07-04 MED ORDER — OMEGA-3-ACID ETHYL ESTERS 1 G PO CAPS: 2.0000 g | ORAL_CAPSULE | Freq: Two times a day (BID) | ORAL | Status: DC

## 2012-07-04 MED ORDER — ROSUVASTATIN CALCIUM 10 MG PO TABS: 10.0000 mg | ORAL_TABLET | Freq: Every day | ORAL | Status: DC

## 2012-07-04 NOTE — Assessment & Plan Note (Signed)
Start crestor and welchol Check the FLP today

## 2012-07-04 NOTE — Assessment & Plan Note (Signed)
I will try to discern if she is more type 1 or type 2 with c-peptide and islet cell abs She will stay on lnsulin and will also start janumet-xr and welchol I will check her blood sugar today I have asked her to speak to her psych about seroquel and abilify since these are associated with DM2

## 2012-07-04 NOTE — Assessment & Plan Note (Signed)
Her trigs are >500 so I have asked her to start lovaza

## 2012-07-04 NOTE — Assessment & Plan Note (Signed)
I will check her vitamin D level today

## 2012-07-04 NOTE — Progress Notes (Signed)
Subjective:    Patient ID: [name] [name], female    DOB: 11/07/1945, 67 y.o.   MRN: [id number]  Diabetes She presents for her follow-up diabetic visit. She has type 2 diabetes mellitus. Her disease course has been stable. There are no hypoglycemic associated symptoms. Pertinent negatives for hypoglycemia include no dizziness or pallor. Associated symptoms include blurred vision, polydipsia, polyphagia and polyuria. Pertinent negatives for diabetes include no chest pain, no fatigue, no foot paresthesias, no foot ulcerations, no visual change, no weakness and no weight loss. There are no hypoglycemic complications. Symptoms are improving. There are no diabetic complications. Current diabetic treatment includes insulin injections. She is compliant with treatment all of the time. Her weight is stable. She is following a generally healthy diet. Meal planning includes avoidance of concentrated sweets. She has not had a previous visit with a dietician. She never participates in exercise. Her home blood glucose trend is decreasing steadily. Her breakfast blood glucose range is generally 180-200 mg/dl. Her lunch blood glucose range is generally 180-200 mg/dl. Her dinner blood glucose range is generally >200 mg/dl. Her highest blood glucose is >200 mg/dl. Her overall blood glucose range is >200 mg/dl. An ACE inhibitor/angiotensin II receptor blocker is not being taken. She does not see a podiatrist.Eye exam is not current.      Review of Systems  Constitutional: Negative.  Negative for fever, chills, weight loss, diaphoresis, activity change, appetite change, fatigue and unexpected weight change.  HENT: Negative.   Eyes: Positive for blurred vision.  Respiratory: Negative.   Cardiovascular: Negative.  Negative for chest pain, palpitations and leg swelling.  Gastrointestinal: Negative.  Negative for nausea, vomiting, abdominal pain, diarrhea, constipation, blood in stool, abdominal distention, anal bleeding  and rectal pain.  Endocrine: Positive for polydipsia, polyphagia and polyuria.  Musculoskeletal: Negative.  Negative for myalgias, back pain, joint swelling, arthralgias and gait problem.  Skin: Negative.  Negative for color change, pallor, rash and wound.  Allergic/Immunologic: Negative.   Neurological: Negative.  Negative for dizziness and weakness.  Hematological: Negative.  Negative for adenopathy. Does not bruise/bleed easily.  Psychiatric/Behavioral: Negative.        Objective:   Physical Exam  Vitals reviewed. Constitutional: She is oriented to person, place, and time. She appears well-developed and well-nourished.  Non-toxic appearance. She does not have a sickly appearance. She does not appear ill. No distress.  HENT:  Head: Normocephalic and atraumatic.  Mouth/Throat: Oropharynx is clear and moist. No oropharyngeal exudate.  Eyes: Conjunctivae are normal. Right eye exhibits no discharge. Left eye exhibits no discharge. No scleral icterus.  Neck: Normal range of motion. Neck supple. No JVD present. No tracheal deviation present. No thyromegaly present.  Cardiovascular: Normal rate, regular rhythm, normal heart sounds and intact distal pulses.  Exam reveals no gallop and no friction rub.   No murmur heard. Pulmonary/Chest: Effort normal and breath sounds normal. No stridor. No respiratory distress. She has no wheezes. She has no rales. She exhibits no tenderness.  Abdominal: Soft. Bowel sounds are normal. She exhibits no distension and no mass. There is no tenderness. There is no rebound and no guarding.  Musculoskeletal: She exhibits no edema and no tenderness.  Lymphadenopathy:    She has no cervical adenopathy.  Neurological: She is oriented to person, place, and time.  Skin: Skin is warm and dry. No rash noted. She is not diaphoretic. No erythema. No pallor.  Psychiatric: She has a normal mood and affect. Her behavior is normal. Judgment and thought content normal.  Lab Results  Component Value Date   WBC 8.0 06/23/2012   HGB 13.6 06/23/2012   HCT 41.2 06/23/2012   PLT 155 06/23/2012   GLUCOSE 263* 06/23/2012   CHOL 229* 05/05/2010   TRIG 254.0* 05/05/2010   HDL 78.30 05/05/2010   LDLDIRECT 122.3 05/05/2010   ALT 46* 06/22/2012   AST 42* 06/22/2012   NA 131* 06/23/2012   K 4.1 06/23/2012   CL 100 06/23/2012   CREATININE 0.83 06/23/2012   BUN 7 06/23/2012   CO2 24 06/23/2012   TSH 2.318 06/18/2012   INR 1.11 12/26/2010   HGBA1C 10.6* 06/18/2012       Assessment & Plan:

## 2012-07-04 NOTE — Patient Instructions (Addendum)

## 2012-07-05 ENCOUNTER — Telehealth: Payer: Self-pay | Admitting: *Deleted

## 2012-07-05 DIAGNOSIS — E559 Vitamin D deficiency, unspecified: Secondary | ICD-10-CM | POA: Insufficient documentation

## 2012-07-05 LAB — C-PEPTIDE: C-Peptide: 4.9 ng/mL — ABNORMAL HIGH (ref 0.80–3.90)

## 2012-07-05 LAB — VITAMIN D 25 HYDROXY (VIT D DEFICIENCY, FRACTURES): Vit D, 25-Hydroxy: 21 ng/mL — ABNORMAL LOW (ref 30–89)

## 2012-07-05 MED ORDER — CHOLECALCIFEROL 1.25 MG (50000 UT) PO TABS: 1.0000 | ORAL_TABLET | ORAL | Status: DC

## 2012-07-05 MED ORDER — VITAMIN D (ERGOCALCIFEROL) 1.25 MG (50000 UNIT) PO CAPS: 50000.0000 [IU] | ORAL_CAPSULE | ORAL | Status: DC

## 2012-07-05 NOTE — Telephone Encounter (Signed)
Pharmacy request change in Vitamin D Rx d/t unavailability of px medication/SLS

## 2012-07-05 NOTE — Assessment & Plan Note (Signed)
Start cholecalciferol 50,000 units weekly 

## 2012-07-05 NOTE — Addendum Note (Signed)
Addended by: Etta Grandchild on: 07/05/2012 07:38 AM   Modules accepted: Orders

## 2012-07-06 ENCOUNTER — Telehealth: Payer: Self-pay | Admitting: Internal Medicine

## 2012-07-06 ENCOUNTER — Encounter: Payer: Self-pay | Admitting: Internal Medicine

## 2012-07-06 NOTE — Telephone Encounter (Signed)
Phone call to pt and left message on identified voicemail regarding continuing Crestor and Janumet as prescribed but if she has continued nausea to hold the [facility] and call back with any questions or concerns.

## 2012-07-06 NOTE — Telephone Encounter (Signed)
Patient Information:  Caller Name: [place]  Phone: [phone]  Patient: [name] [name], [name] [name]  Gender: Female  DOB: Oct 03, 1945  Age: 67 Years  PCP: Illene Regulus (Adults only)  Office Follow Up:  Does the office need to follow up with this patient?: Yes  Instructions For The Office: OFFICE PLEASE FOLLOW UP WITH PATIENT REGARDING NAUSEA AND WEAKNESS SHE IS FEELING AFTER STARTING NEW MEDICATIONS  RN Note:  Pt is wanting to get a message sent back to MD to see if it is the medications that are making her feel this way.  Symptoms  Reason For Call & Symptoms: pt reports that she was seen in the office on 07/04/12 by Dr Yetta Barre.  Pt was put on new medications (Crestor, Welchol, and Janumet XR).  Pt reports in the am she is very nauseated and weak.  Pt states her symptoms are getting better as the day progresses. Pt has not vomited but feels like she is going to.    BS this am was 151.  Reviewed Health History In EMR: Yes  Reviewed Medications In EMR: Yes  Reviewed Allergies In EMR: Yes  Reviewed Surgeries / Procedures: Yes  Date of Onset of Symptoms: 07/04/2012  Guideline(s) Used:  No Protocol Available - Information Only  Disposition Per Guideline:   Discuss with PCP and Callback by Nurse Today  Reason For Disposition Reached:   Nursing judgment  Advice Given:  N/A  Patient Will Follow Care Advice:  YES

## 2012-07-06 NOTE — Telephone Encounter (Signed)
If continued nausea symptoms, hold welchol at this time - I would encourage her to continue Crestor and Janumet as rx'd

## 2012-07-08 LAB — ANTI-ISLET CELL ANTIBODY: Pancreatic Islet Cell Antibody: <5 JDF Units (ref <5)

## 2012-07-09 LAB — METANEPHRINES, URINE, 24 HOUR
Metaneph Total, Ur: 213 mcg/24 h — ABNORMAL LOW (ref 224–832)
Metanephrines, Ur: 66 mcg/24 h — ABNORMAL LOW (ref 90–315)
Normetanephrine, 24H Ur: 147 mcg/24 h (ref 122–676)

## 2012-07-12 LAB — CATECHOLAMINES, FRACTIONATED, URINE, 24 HOUR
Calculated Total (E+NE): 15 mcg/24 h (ref 26–121)
Creatinine, Urine mg/day-CATEUR: 0.78 g/(24.h) (ref 0.63–2.50)
Dopamine, 24 hr Urine: 98 mcg/24 h (ref 52–480)
Norepinephrine, 24 hr Ur: 15 mcg/24 h (ref 15–100)
Total Volume - CF 24Hr U: 1900 mL

## 2012-07-14 ENCOUNTER — Ambulatory Visit: Payer: BC Managed Care – PPO

## 2012-07-15 ENCOUNTER — Telehealth: Payer: Self-pay

## 2012-07-15 NOTE — Telephone Encounter (Signed)
Pt notified. She asks about the results of her 24 hour urine test. Please advise.

## 2012-07-15 NOTE — Telephone Encounter (Signed)
Results of 24 hour urine just recently back - NORMAL.  Have a great weekend.

## 2012-07-15 NOTE — Telephone Encounter (Signed)
Message copied by Noreene Larsson on Fri Jul 15, 2012  8:17 AM ------      Message from: Illene Regulus E      Created: Thu Jul 14, 2012  5:10 PM       Georgiann Hahn - please call patient - normal holter monitor test. Thanks ------

## 2012-07-15 NOTE — Telephone Encounter (Signed)
Lm on pt's mobile phone,name identified on vm, that her test was normal

## 2012-07-20 ENCOUNTER — Encounter: Payer: BC Managed Care – PPO | Admitting: Physical Medicine and Rehabilitation

## 2012-07-26 ENCOUNTER — Encounter: Payer: Self-pay | Admitting: Physical Medicine and Rehabilitation

## 2012-07-26 ENCOUNTER — Encounter
Payer: BC Managed Care – PPO | Attending: Physical Medicine and Rehabilitation | Admitting: Physical Medicine and Rehabilitation

## 2012-07-26 VITALS — BP 110/64 | HR 79 | Resp 16 | Ht 62.0 in | Wt 170.0 lb

## 2012-07-26 DIAGNOSIS — E119 Type 2 diabetes mellitus without complications: Secondary | ICD-10-CM | POA: Insufficient documentation

## 2012-07-26 DIAGNOSIS — M545 Low back pain, unspecified: Secondary | ICD-10-CM | POA: Insufficient documentation

## 2012-07-26 DIAGNOSIS — G8929 Other chronic pain: Secondary | ICD-10-CM | POA: Insufficient documentation

## 2012-07-26 DIAGNOSIS — F319 Bipolar disorder, unspecified: Secondary | ICD-10-CM | POA: Insufficient documentation

## 2012-07-26 DIAGNOSIS — IMO0001 Reserved for inherently not codable concepts without codable children: Secondary | ICD-10-CM

## 2012-07-26 DIAGNOSIS — M79609 Pain in unspecified limb: Secondary | ICD-10-CM | POA: Insufficient documentation

## 2012-07-26 DIAGNOSIS — M47812 Spondylosis without myelopathy or radiculopathy, cervical region: Secondary | ICD-10-CM

## 2012-07-26 MED ORDER — BUPRENORPHINE 15 MCG/HR TD PTWK: 15.0000 ug/h | MEDICATED_PATCH | TRANSDERMAL | Status: DC
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
## 2012-07-26 NOTE — Patient Instructions (Signed)
Try to restart your walking program

## 2012-07-28 ENCOUNTER — Encounter: Payer: Self-pay | Admitting: *Deleted

## 2012-07-28 ENCOUNTER — Encounter: Payer: BC Managed Care – PPO | Attending: Internal Medicine | Admitting: *Deleted

## 2012-07-28 VITALS — Ht 62.0 in | Wt 170.7 lb

## 2012-07-28 DIAGNOSIS — IMO0001 Reserved for inherently not codable concepts without codable children: Secondary | ICD-10-CM

## 2012-07-28 DIAGNOSIS — E119 Type 2 diabetes mellitus without complications: Secondary | ICD-10-CM | POA: Insufficient documentation

## 2012-07-28 DIAGNOSIS — Z713 Dietary counseling and surveillance: Secondary | ICD-10-CM | POA: Insufficient documentation

## 2012-07-28 NOTE — Patient Instructions (Signed)
Plan:  Aim for 2-3 Carb Choices per meal (30-45 grams) +/- 1 either way  Aim for 0-1 Carbs per snack if hungry  Include protein at each meal and snack to keep you from getting hungry Consider reading food labels for Total Carbohydrate of foods Consider  increasing your activity level by trying Arm Chair exercises  For 15 minutes daily as tolerated Continue checking BG at alternate times per day as directed by MD  If your BG drops into the 70's call your MD about reducing your Lantus dose to prevent low BG

## 2012-07-28 NOTE — Progress Notes (Signed)
  Medical Nutrition Therapy:  Appt start time: 1145 end time:  1245.  Assessment:  Primary concerns today: newly diagnosed with Diabetes. Lives with husband, he shops and prepares meals. SMBG 3 times a day, pre and post meals. Average range now is 90 - 150 mg/dl. She states she is taking Lantus and Janumet. Retired from [employer],  gets together with friends for lunch, has active life. No hypoglycemia reported yet.  MEDICATIONS: see list   DIETARY INTAKE:  Usual eating pattern includes 3 meals and 3 snacks per day.  Everyday foods include good variety of all food groups.  Avoided foods include coffee.    24-hr recall:  B ( AM): didn't use to, now has whole wheat bread with PNB and a banana OR boiled egg with bread OR Glucerna chocolate drink, Crystal Light Peach tea Snk ( AM): cheese OR mini Glucerna bar and some nuts  L ( PM): out with friends; salad with grilled chicken and low fat salad dressing, water or unsweet tea or diet soda Snk ( PM): fresh fruit or nuts or protein bar D ( PM): vegetable plate with beans and corn bread OR tacos, occasionally meat, unsweet tea Snk ( PM): protein bar or Glucerna Beverages: Crystal Light Peach tea, water, unsweet tea or diet soda  Usual physical activity: not lately, very tired  Estimated energy needs: 1400 calories 158 g carbohydrates 105 g protein 39 g fat  Progress Towards Goal(s):  In progress.   Nutritional Diagnosis:  NB-1.1 Food and nutrition-related knowledge deficit As related to new diagnosis of diabetes.  As evidenced by A1c of 10.6% on 06/18/2012.    Intervention:  Nutrition counseling and diabetes education initiated. Discussed basic physiology of diabetes, SMBG and rationale of checking BG at alternate times of day, A1c, Carb Counting and reading food labels, and benefits of increased activity. Also discussed insulin action of Lantus along with signs, symptoms and treatment for hypoglycemia  . Plan:  Aim for 2-3  Carb Choices per meal (30-45 grams) +/- 1 either way  Aim for 0-1 Carbs per snack if hungry  Include protein at each meal and snack to keep you from getting hungry Consider reading food labels for Total Carbohydrate of foods Consider  increasing your activity level by trying Arm Chair exercises  For 15 minutes daily as tolerated Continue checking BG at alternate times per day as directed by MD  If your BG drops into the 70's call your MD about reducing your Lantus dose to prevent low BG   Handouts given during visit include: Living Well with Diabetes Carb Counting and Food Label handouts Meal Plan Card  Insulin action handout  Monitoring/Evaluation:  Dietary intake, exercise, SMBG, and body weight prn. Patient agrees to call as needed for additional education

## 2012-08-02 ENCOUNTER — Encounter: Payer: Self-pay | Admitting: Internal Medicine

## 2012-08-02 ENCOUNTER — Ambulatory Visit (INDEPENDENT_AMBULATORY_CARE_PROVIDER_SITE_OTHER): Payer: BC Managed Care – PPO | Admitting: Internal Medicine

## 2012-08-02 VITALS — BP 110/76 | HR 88 | Ht 62.0 in | Wt 171.2 lb

## 2012-08-02 DIAGNOSIS — K5900 Constipation, unspecified: Secondary | ICD-10-CM

## 2012-08-02 DIAGNOSIS — K559 Vascular disorder of intestine, unspecified: Secondary | ICD-10-CM

## 2012-08-02 NOTE — Patient Instructions (Addendum)
Please start taking Magnesium Oxide 2 pills daily these can be purchased over the counter. Decrease your Klonopin to 0.1 mg daily. CC:  Illene Regulus MD

## 2012-08-02 NOTE — Progress Notes (Signed)
[name] [name] Apr 08, 1945 MRN [id number]        History of Present Illness:  This is a 67 year old white female with recurrent ischemic colitis involving sigmoid colon. She was recently hospitalized and had a flexible sigmoidoscopy which showed normal left colon. Prior episode of ischemic colitis occurred in November 2012 and at that time she had ulcerations and friable mucosa in the sigmoid colon on flexible sigmoidoscopy.by Dr [name] [name]. The colitis was attributed to low flow state in the setting of dehydration and use of psychotropic agents: she was on hydrocodone, Seroquel, and she is currently on clonidine 0.2 mg at bedtime to prevent nightmares. Her blood pressures have been running low. And she has occasional orthostatic dizziness when she stands up. He is constipated, having bowel movements every 4-5 days. She takes lactulose 1-2 tablespoons when necessary but does not like the taste. Last full colonoscopy was in December 2012 and showed moderately severe diverticulosis   Past Medical History  Diagnosis Date  . Bilateral hearing loss   . Otitis media, chronic   . Urinary, incontinence, stress female   . Dyslipidemia   . Allergic rhinitis, seasonal   . Osteoarthritis cervical spine   . Osteoarthritis of hand     bilateral  . Alcohol abuse, in remission   . Colitis, ischemic 2012  . Sciatica   . Diverticulitis 2012  . Alcohol abuse   . Depression with anxiety   . Bipolar 1 disorder   . Hepatic steatosis 06/18/12    severe  . Diabetes mellitus without complication    Past Surgical History  Procedure Laterality Date  . D&c x 4    . Btl laproscopic    . Abdominal hysterectomy    . Tonsillectomy    . Excision of tumors at right neck      angle of jaw '68, benign  . Hearing loss      BIL HEARING LOSS  . Otitis    . Urinary incontinence    . Dislipidemia    . Flexible sigmoidoscopy N/A 06/21/2012    Procedure: FLEXIBLE SIGMOIDOSCOPY;  Surgeon: [name] Fiedler, MD;   Location: [facility];  Service: Gastroenterology;  Laterality: N/A;    reports that she has never smoked. She has never used smokeless tobacco. She reports that she does not drink alcohol or use illicit drugs. family history includes Alcohol abuse in her brother, father, and mother; Colon cancer in her paternal uncle; Diabetes in her brother; and Drug abuse in her mother. Allergies  Allergen Reactions  . Azithromycin Nausea And Vomiting    All mycins  . Erythromycin Nausea And Vomiting  . Codeine Other (See Comments)    Feeling weird   . Neurontin (Gabapentin) Other (See Comments)    Reaction=unknown  . Oxycodone Other (See Comments)    Reaction=unknown  . Procaine Hcl Other (See Comments)    Reaction=unknown  . Sulfur Other (See Comments)    Reaction=unknown        Review of Systems: Denies rectal bleeding  The remainder of the 10 point ROS is negative except as outlined in H&P   Physical Exam: General appearance  Well developed, in no distress. Eyes- non icteric. HEENT nontraumatic, normocephalic. Mouth no lesions, tongue papillated, no cheilosis. Neck supple without adenopathy, thyroid not enlarged, no carotid bruits, no JVD. Lungs Clear to auscultation bilaterally. Cor normal S1, normal S2, regular rhythm, no murmur,  quiet precordium. Abdomen: Soft to relax with normal active bowel sounds. No distention. Mild tenderness in the left  and right lower quadrants. No rebound. No bruit or fullness Rectal: Not done Extremities no pedal edema. Skin no lesions. Neurological alert and oriented x 3. Psychological normal mood and affect.  Assessment and Plan:  67 year old white female with recurrent episodes of ischemic colitis. I have asked her to decrease her clonidine from 0.2 to 0.1 mg a day to  avoid low-flow state during night which lead to ischemic changes in her colon. We may, eventually, have to discontinue her clonidine completely if the episodes continue.I have asked  her to obtain blood pressure cuff and monitor her blood pressures daily and hold the Clonidine if systolic blood pressure is less than 90.. I have also put her on magnesium oxide 500 mg 2 tablets daily to help with constipation. Amitiza is another possibility. Recall colonoscopy in 2022   08/02/2012 [name] [name]

## 2012-08-04 ENCOUNTER — Ambulatory Visit: Payer: BC Managed Care – PPO

## 2012-08-06 ENCOUNTER — Ambulatory Visit (INDEPENDENT_AMBULATORY_CARE_PROVIDER_SITE_OTHER): Payer: BC Managed Care – PPO | Admitting: Family Medicine

## 2012-08-06 VITALS — BP 115/76 | HR 80 | Temp 98.0°F | Resp 18 | Wt 173.0 lb

## 2012-08-06 DIAGNOSIS — IMO0002 Reserved for concepts with insufficient information to code with codable children: Secondary | ICD-10-CM

## 2012-08-06 DIAGNOSIS — M5416 Radiculopathy, lumbar region: Secondary | ICD-10-CM

## 2012-08-06 DIAGNOSIS — M549 Dorsalgia, unspecified: Secondary | ICD-10-CM

## 2012-08-06 MED ORDER — HYDROCODONE-ACETAMINOPHEN 5-325 MG PO TABS: 1.0000 | ORAL_TABLET | Freq: Four times a day (QID) | ORAL | Status: DC | PRN

## 2012-08-06 MED ORDER — KETOROLAC TROMETHAMINE 30 MG/ML IJ SOLN
30.0000 mg | Freq: Once | INTRAMUSCULAR | Status: AC
  Administered 2012-08-06: 30 mg via INTRAMUSCULAR
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
## 2012-08-08 ENCOUNTER — Telehealth: Payer: Self-pay

## 2012-08-08 NOTE — Telephone Encounter (Signed)
Patient is in increased pain.  Went to Urgent care and got hydrocodone but it is not helping.  Advised patient to make an appointment.

## 2012-08-09 ENCOUNTER — Encounter
Payer: BC Managed Care – PPO | Attending: Physical Medicine and Rehabilitation | Admitting: Physical Medicine and Rehabilitation

## 2012-08-09 ENCOUNTER — Encounter: Payer: Self-pay | Admitting: Physical Medicine and Rehabilitation

## 2012-08-09 VITALS — BP 140/70 | HR 101 | Resp 14 | Ht 62.0 in | Wt 170.0 lb

## 2012-08-09 DIAGNOSIS — IMO0001 Reserved for inherently not codable concepts without codable children: Secondary | ICD-10-CM

## 2012-08-09 DIAGNOSIS — F319 Bipolar disorder, unspecified: Secondary | ICD-10-CM | POA: Insufficient documentation

## 2012-08-09 DIAGNOSIS — G8929 Other chronic pain: Secondary | ICD-10-CM | POA: Insufficient documentation

## 2012-08-09 DIAGNOSIS — M545 Low back pain, unspecified: Secondary | ICD-10-CM | POA: Insufficient documentation

## 2012-08-09 DIAGNOSIS — M47817 Spondylosis without myelopathy or radiculopathy, lumbosacral region: Secondary | ICD-10-CM | POA: Insufficient documentation

## 2012-08-09 DIAGNOSIS — M79609 Pain in unspecified limb: Secondary | ICD-10-CM | POA: Insufficient documentation

## 2012-08-09 MED ORDER — BUPRENORPHINE 10 MCG/HR TD PTWK: 10.0000 ug | MEDICATED_PATCH | TRANSDERMAL | Status: DC

## 2012-08-09 NOTE — Patient Instructions (Signed)
Continue with applying heat, getting some massages, and doing mild exercises to relax your muscles.
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
## 2012-08-12 ENCOUNTER — Telehealth: Payer: Self-pay

## 2012-08-12 NOTE — Telephone Encounter (Signed)
Patient states she received samples of Crestor and now needs a prescription sent to [facility]. I advised patient a prescription was sent 07/04/12 to the [facility] on [address]. She said she will re-check with your pharmacy.

## 2012-08-17 ENCOUNTER — Telehealth: Payer: Self-pay

## 2012-08-17 NOTE — Telephone Encounter (Signed)
Patient called [facility] statign that per pharmacy, insurance will not cover crestor. They advised that our office try to get covered through [place]. Thanks

## 2012-08-17 NOTE — Telephone Encounter (Signed)
Left message for patient to return call.

## 2012-08-17 NOTE — Telephone Encounter (Signed)
Should I go through prior authorization process or do you want to switch to a different medication? Please advise, thanks.

## 2012-08-17 NOTE — Telephone Encounter (Signed)
Spoke to pt, she is stating intolerant so I will start prior authorization for Crestor.

## 2012-08-17 NOTE — Telephone Encounter (Signed)
I believe she has been statin intolerant but med record isn't helping me. Please check with her - if she has tried and failed several products including pravastatin (pravachol) will file [place]

## 2012-08-18 ENCOUNTER — Ambulatory Visit: Payer: BC Managed Care – PPO

## 2012-08-18 NOTE — Telephone Encounter (Signed)
[place] approved [place] Crestor 10 mg starting 08/17/12-03/08/2038. Faxed approval letter to [facility] on [address] [phone].

## 2012-08-18 NOTE — Telephone Encounter (Signed)
Pt notified via voice mail

## 2012-08-18 NOTE — Telephone Encounter (Signed)
PA submitted.

## 2012-10-03 ENCOUNTER — Other Ambulatory Visit: Payer: Self-pay | Admitting: Internal Medicine

## 2012-10-20 ENCOUNTER — Encounter: Payer: Self-pay | Admitting: Internal Medicine

## 2012-10-20 ENCOUNTER — Other Ambulatory Visit (INDEPENDENT_AMBULATORY_CARE_PROVIDER_SITE_OTHER): Payer: BC Managed Care – PPO

## 2012-10-20 ENCOUNTER — Ambulatory Visit (INDEPENDENT_AMBULATORY_CARE_PROVIDER_SITE_OTHER): Payer: BC Managed Care – PPO | Admitting: Internal Medicine

## 2012-10-20 VITALS — BP 110/84 | HR 76 | Temp 98.5°F | Wt 170.8 lb

## 2012-10-20 DIAGNOSIS — IMO0001 Reserved for inherently not codable concepts without codable children: Secondary | ICD-10-CM

## 2012-10-20 DIAGNOSIS — M549 Dorsalgia, unspecified: Secondary | ICD-10-CM

## 2012-10-20 DIAGNOSIS — M797 Fibromyalgia: Secondary | ICD-10-CM

## 2012-10-20 DIAGNOSIS — IMO0002 Reserved for concepts with insufficient information to code with codable children: Secondary | ICD-10-CM

## 2012-10-20 DIAGNOSIS — M5416 Radiculopathy, lumbar region: Secondary | ICD-10-CM

## 2012-10-20 LAB — HEMOGLOBIN A1C: Hgb A1c MFr Bld: 6.1 % (ref 4.6–6.5)

## 2012-10-20 MED ORDER — HYDROCODONE-ACETAMINOPHEN 5-325 MG PO TABS: 2.0000 | ORAL_TABLET | Freq: Four times a day (QID) | ORAL | Status: DC | PRN

## 2012-10-20 NOTE — Assessment & Plan Note (Addendum)
A1C was measured and found to be at goal. Lab Results  Component Value Date   HGBA1C 6.1 10/20/2012   Plan: continue taking sitagliptin-metformin.   Discontinue insulin glargine (Lantus) at this time; it may be restarted if glycemic control lapses.

## 2012-10-20 NOTE — Progress Notes (Signed)
Subjective:     Patient ID: [name] [name], female   DOB: June 29, 1945, 67 y.o.   MRN: [id number]  HPI 1. Here for a 35-month follow up after being diagnosed with incidentally-discovered Type II diabetes while an inpatient (on approx 4/12).  She is taking insulin and sitagliptin-metformin for this and has been compliant with medications.  She has not experienced any hypoglycemic events.  She forgot her glucose measurement log today but states that it is usually 105.  She measures it at different times: first thing in AM, middle of the day, sometimes at bedtime.  She reports that her diet is healthy: she is consuming little salt and few refined carbohydrates.  She has not been getting much exercise.    Review of Systems HENT - Vision was blurry.  No changes in hearing. CV - no pain, palpitations; occasional ankle swelling Pulm - unremarkable GI - no C/D Neuro - no numbness, tingling, tremors or weakness.    Objective:   Physical Exam Gen'l - well-appearing 66 YO in NAD Filed Vitals:   10/20/12 1312  BP: 110/84  Pulse: 76  Temp: 98.5 F (36.9 C)    CV - unremarkable Pulm - unremarkable    Assessment:         Plan:

## 2012-10-20 NOTE — Assessment & Plan Note (Signed)
Fibromyalgia continues to bother [name] [name] despite ibuprofen (Advil) therapy.   Was not successfully treated at the pain clinic.  Plan: hydrocodon-acetaminophen (Norco-Vicodin) was prescribed. Patient is to take one tablet, twice per day. Substance abuse agreement was discussed and signed.

## 2012-10-20 NOTE — Patient Instructions (Addendum)
Thanks for working with me [name] [name]) today.  Diabetes: We are checking your hemoglobin A1C levels today.  We will mail you the measurements.  It sounds like your sugar levels are good when you've been measuring them.  Keep up the good work.  For your reference, I've included some information about diabetes. Plan: Keep taking the sitagliptin-metformin medication.  You should continue to take the insulin glargine (Lantus).  You should exercise for 30 minutes per day, 3-5 times per week.  I've included some information about diabetes and exercise below.  You can start out at 15 minutes per day, 3 times per week and build from there.    High blood pressure: Your blood pressure was at goal today. Plan: Keep up the health diet!  High cholesterol: We will measure this today in the lab. Plan: Please keep taking your rosuvastatin (Crestor).  Pain: I'm sorry to hear your pain from fibromyalgia and arthritis is not well controlled. Plan: We will give you a prescription for hydrocodone-acetaminophen (Norco/Vicodin).  Please continue to take Advil daily and only take these as needed.  Diabetes and Exercise Regular exercise is important and can help:   Control blood glucose (sugar).  Decrease blood pressure.    Control blood lipids (cholesterol, triglycerides).  Improve overall health. BENEFITS FROM EXERCISE  Improved fitness.  Improved flexibility.  Improved endurance.  Increased bone density.  Weight control.  Increased muscle strength.  Decreased body fat.  Improvement of the body's use of insulin, a hormone.  Increased insulin sensitivity.  Reduction of insulin needs.  Reduced stress and tension.  Helps you feel better. People with diabetes who add exercise to their lifestyle gain additional benefits, including:  Weight loss.  Reduced appetite.  Improvement of the body's use of blood glucose.  Decreased risk factors for heart disease:  Lowering of cholesterol and  triglycerides.  Raising the level of good cholesterol (high-density lipoproteins, HDL).  Lowering blood sugar.  Decreased blood pressure. TYPE 1 DIABETES AND EXERCISE  Exercise will usually lower your blood glucose.  If blood glucose is greater than 240 mg/dl, check urine ketones. If ketones are present, do not exercise.  Location of the insulin injection sites may need to be adjusted with exercise. Avoid injecting insulin into areas of the body that will be exercised. For example, avoid injecting insulin into:  The arms when playing tennis.  The legs when jogging. For more information, discuss this with your caregiver.  Keep a record of:  Food intake.  Type and amount of exercise.  Expected peak times of insulin action.  Blood glucose levels. Do this before, during, and after exercise. Review your records with your caregiver. This will help you to develop guidelines for adjusting food intake and insulin amounts.  TYPE 2 DIABETES AND EXERCISE  Regular physical activity can help control blood glucose.  Exercise is important because it may:  Increase the body's sensitivity to insulin.  Improve blood glucose control.  Exercise reduces the risk of heart disease. It decreases serum cholesterol and triglycerides. It also lowers blood pressure.  Those who take insulin or oral hypoglycemic agents should watch for signs of hypoglycemia. These signs include dizziness, shaking, sweating, chills, and confusion.  Body water is lost during exercise. It must be replaced. This will help to avoid loss of body fluids (dehydration) or heat stroke. Be sure to talk to your caregiver before starting an exercise program to make sure it is safe for you. Remember, any activity is better than none.  Document Released: 05/16/2003 Document Revised: 05/18/2011 Document Reviewed: 08/30/2008 [facility] Patient Information 2014 [place], [place].

## 2012-10-24 ENCOUNTER — Telehealth: Payer: Self-pay | Admitting: *Deleted

## 2012-10-24 NOTE — Telephone Encounter (Signed)
6.1%. May stop lantus shots

## 2012-10-24 NOTE — Telephone Encounter (Signed)
Spoke with pt advised of MDs message 

## 2012-10-24 NOTE — Telephone Encounter (Signed)
Pt called requesting A1C results.  Please advise

## 2012-10-26 ENCOUNTER — Ambulatory Visit: Payer: BC Managed Care – PPO | Admitting: Physical Medicine and Rehabilitation

## 2012-11-03 ENCOUNTER — Emergency Department (HOSPITAL_COMMUNITY)
Admission: EM | Admit: 2012-11-03 | Discharge: 2012-11-03 | Disposition: A | Discharge status: Home / Self Care | Payer: BC Managed Care – PPO | Attending: Emergency Medicine | Admitting: Emergency Medicine

## 2012-11-03 ENCOUNTER — Telehealth: Payer: Self-pay | Admitting: *Deleted

## 2012-11-03 DIAGNOSIS — Z79899 Other long term (current) drug therapy: Secondary | ICD-10-CM | POA: Insufficient documentation

## 2012-11-03 DIAGNOSIS — E785 Hyperlipidemia, unspecified: Secondary | ICD-10-CM | POA: Insufficient documentation

## 2012-11-03 DIAGNOSIS — Z8719 Personal history of other diseases of the digestive system: Secondary | ICD-10-CM | POA: Insufficient documentation

## 2012-11-03 DIAGNOSIS — R209 Unspecified disturbances of skin sensation: Secondary | ICD-10-CM | POA: Insufficient documentation

## 2012-11-03 DIAGNOSIS — E119 Type 2 diabetes mellitus without complications: Secondary | ICD-10-CM | POA: Insufficient documentation

## 2012-11-03 DIAGNOSIS — IMO0002 Reserved for concepts with insufficient information to code with codable children: Secondary | ICD-10-CM | POA: Insufficient documentation

## 2012-11-03 DIAGNOSIS — H9190 Unspecified hearing loss, unspecified ear: Secondary | ICD-10-CM | POA: Insufficient documentation

## 2012-11-03 DIAGNOSIS — R451 Restlessness and agitation: Secondary | ICD-10-CM

## 2012-11-03 DIAGNOSIS — F341 Dysthymic disorder: Secondary | ICD-10-CM | POA: Insufficient documentation

## 2012-11-03 DIAGNOSIS — R5381 Other malaise: Secondary | ICD-10-CM | POA: Insufficient documentation

## 2012-11-03 DIAGNOSIS — Z8739 Personal history of other diseases of the musculoskeletal system and connective tissue: Secondary | ICD-10-CM | POA: Insufficient documentation

## 2012-11-03 DIAGNOSIS — Z8669 Personal history of other diseases of the nervous system and sense organs: Secondary | ICD-10-CM | POA: Insufficient documentation

## 2012-11-03 DIAGNOSIS — G479 Sleep disorder, unspecified: Secondary | ICD-10-CM | POA: Insufficient documentation

## 2012-11-03 DIAGNOSIS — IMO0001 Reserved for inherently not codable concepts without codable children: Secondary | ICD-10-CM | POA: Insufficient documentation

## 2012-11-03 DIAGNOSIS — R4589 Other symptoms and signs involving emotional state: Secondary | ICD-10-CM | POA: Insufficient documentation

## 2012-11-03 DIAGNOSIS — F319 Bipolar disorder, unspecified: Secondary | ICD-10-CM | POA: Insufficient documentation

## 2012-11-03 DIAGNOSIS — F419 Anxiety disorder, unspecified: Secondary | ICD-10-CM

## 2012-11-03 DIAGNOSIS — M549 Dorsalgia, unspecified: Secondary | ICD-10-CM | POA: Insufficient documentation

## 2012-11-03 DIAGNOSIS — Z8709 Personal history of other diseases of the respiratory system: Secondary | ICD-10-CM | POA: Insufficient documentation

## 2012-11-03 LAB — CBC
HCT: 43.2 % (ref 36.0–46.0)
Hemoglobin: 14.3 g/dL (ref 12.0–15.0)
MCH: 28.7 pg (ref 26.0–34.0)
MCHC: 33.1 g/dL (ref 30.0–36.0)
MCV: 86.6 fL (ref 78.0–100.0)
Platelets: 228 10*3/uL (ref 150–400)
RBC: 4.99 MIL/uL (ref 3.87–5.11)
RDW: 13.6 % (ref 11.5–15.5)
WBC: 10.5 10*3/uL (ref 4.0–10.5)

## 2012-11-03 LAB — COMPREHENSIVE METABOLIC PANEL
ALT: 16 U/L (ref 0–35)
AST: 15 U/L (ref 0–37)
Albumin: 4.1 g/dL (ref 3.5–5.2)
Alkaline Phosphatase: 98 U/L (ref 39–117)
BUN: 15 mg/dL (ref 6–23)
CO2: 21 mEq/L (ref 19–32)
Calcium: 10.3 mg/dL (ref 8.4–10.5)
Chloride: 103 mEq/L (ref 96–112)
Creatinine, Ser: 0.89 mg/dL (ref 0.50–1.10)
GFR calc Af Amer: 78 mL/min — ABNORMAL LOW (ref >90)
GFR calc non Af Amer: 67 mL/min — ABNORMAL LOW (ref >90)
Glucose, Bld: 147 mg/dL — ABNORMAL HIGH (ref 70–99)
Potassium: 4 mEq/L (ref 3.5–5.1)
Sodium: 134 mEq/L — ABNORMAL LOW (ref 135–145)
Total Bilirubin: 0.4 mg/dL (ref 0.3–1.2)
Total Protein: 7.4 g/dL (ref 6.0–8.3)

## 2012-11-03 LAB — URINALYSIS, ROUTINE W REFLEX MICROSCOPIC
Bilirubin Urine: NEGATIVE
Glucose, UA: NEGATIVE mg/dL
Hgb urine dipstick: NEGATIVE
Ketones, ur: NEGATIVE mg/dL
Leukocytes, UA: NEGATIVE
Nitrite: NEGATIVE
Protein, ur: NEGATIVE mg/dL
Specific Gravity, Urine: 1.017 (ref 1.005–1.030)
Urobilinogen, UA: 1 mg/dL (ref 0.0–1.0)
pH: 6.5 (ref 5.0–8.0)

## 2012-11-03 LAB — LITHIUM LEVEL: Lithium Lvl: <0.25 mEq/L — ABNORMAL LOW (ref 0.80–1.40)

## 2012-11-03 NOTE — Telephone Encounter (Signed)
Pt called states she believes she is having an allergic reaction to Hydrocodone.  Further states she feels anxious, like she "can not setle down".  Pt is requesting a different Rx.  Please advise

## 2012-11-03 NOTE — Telephone Encounter (Signed)
Stop Norco.  She has an allergy listed for gabapentin - what was the reaction? Would like to try lyrica if she did not have a bad reaction to gabapentin.  An alternative may be to talk to her psychiatrist about changing her med table so that she could try Cymbalta for depression and fibromyalgia.

## 2012-11-03 NOTE — ED Provider Notes (Signed)
CSN: [id number]     Arrival date & time 11/03/12  1834 History    Chief Complaint  Patient presents with  . Medication Reaction    The history is provided by the patient. No language interpreter was used.   HPI Comments: [name] [name] is a 67 y.o. female with a PMH of fibromyalgia, anxiety, depression, DM, hepatic steatosis, bipolar disorder, ischemic colitis, OS, and dyslipidemia who presents to the ED for evaluation for restlessness.  Patient states that for one week she has been feeling restless and is in a "jerking and twitching movements all over."  She states "I just can't sit still" and "it feels cold under my skin."  She states she normally has RLS with her legs but normally doesn't have it in her body.  She states it has been making her more anxious than usual.  She states she takes Valium everyday for every day anxiety.  She also states she has not been able to sleep in the past two days.  She states she has "pain everywhere from my fibromyalgia" with no acute changes.  She denies any numbness states that she occasionally gets tingling in her right hand at baseline but des not have the tingling currently.  She denies any focal weakness but says she has been feeling weak which is her baseline. She states she recently was changed from a buprenorphine pain patch to Vicodin last week.  She denies any other medication changes.  She denies any fevers, chills or change in activity or appetite, rhinorrhea, congestion, sore throat, cough, chest pain, shortness of breath, abdominal pain, nausea, vomiting, headache, dizziness, lightheadedness, diarrhea, constipation, dysuria, or leg edema.  Patient states that she tried calling her primary care physician however she has not yet received a call back. She states she also called her psychiatrist however they are on vacation.      Past Medical History  Diagnosis Date  . Bilateral hearing loss   . Otitis media, chronic   . Urinary, incontinence, stress  female   . Dyslipidemia   . Allergic rhinitis, seasonal   . Osteoarthritis cervical spine   . Osteoarthritis of hand     bilateral  . Alcohol abuse, in remission   . Colitis, ischemic 2012  . Sciatica   . Diverticulitis 2012  . Alcohol abuse   . Depression with anxiety   . Bipolar 1 disorder   . Hepatic steatosis 06/18/12    severe  . Diabetes mellitus without complication   . Depression   . Anxiety     Past Surgical History  Procedure Laterality Date  . D&c x 4    . Btl laproscopic    . Abdominal hysterectomy    . Tonsillectomy    . Excision of tumors at right neck      angle of jaw '68, benign  . Hearing loss      BIL HEARING LOSS  . Otitis    . Urinary incontinence    . Dislipidemia    . Flexible sigmoidoscopy N/A 06/21/2012    Procedure: FLEXIBLE SIGMOIDOSCOPY;  Surgeon: Beverley Fiedler, MD;  Location: [facility];  Service: Gastroenterology;  Laterality: N/A;  . Appendectomy    . Tubal ligation      Family History  Problem Relation Age of Onset  . Diabetes Brother   . Alcohol abuse Brother     x 2  . Drug abuse Mother   . Alcohol abuse Mother   . Alcohol abuse Father   .  Colon cancer Paternal Uncle     History  Substance Use Topics  . Smoking status: Never Smoker   . Smokeless tobacco: Never Used  . Alcohol Use: No     Comment: alcohol abuse formally; has been abstinent x 3 years    OB History   Grav Para Term Preterm Abortions TAB SAB Ect Mult Living                   Review of Systems  Constitutional: Negative for fever, chills, activity change, appetite change and fatigue.  HENT: Negative for ear pain, congestion, sore throat, rhinorrhea, neck pain, neck stiffness and dental problem.   Eyes: Negative for photophobia and visual disturbance.  Respiratory: Negative for cough, shortness of breath and wheezing.   Cardiovascular: Negative for chest pain and leg swelling.  Gastrointestinal: Negative for nausea, vomiting, abdominal pain, diarrhea and  constipation.  Genitourinary: Negative for dysuria.  Musculoskeletal: Positive for myalgias (at baseline), back pain (at baseline) and arthralgias (at baseline). Negative for joint swelling and gait problem (patient uses cane).  Skin: Negative for wound.  Neurological: Positive for weakness (generalized) and numbness (see history of present illness). Negative for dizziness, syncope, light-headedness and headaches.  Psychiatric/Behavioral: Positive for sleep disturbance. Negative for suicidal ideas, confusion and self-injury. The patient is nervous/anxious.      Allergies  Azithromycin; Erythromycin; Codeine; Neurontin; Procaine hcl; and Sulfur  Home Medications   Current Outpatient Rx  Name  Route  Sig  Dispense  Refill  . ABILIFY 2 MG tablet   Oral   Take 1 mg by mouth daily.          . cloNIDine (CATAPRES) 0.1 MG tablet   Oral   Take 0.2 mg by mouth at bedtime.          . diazepam (VALIUM) 5 MG tablet   Oral   Take 5 mg by mouth 2 (two) times daily.          Marland Kitchen escitalopram (LEXAPRO) 10 MG tablet   Oral   Take 10 mg by mouth every evening.          Marland Kitchen HYDROcodone-acetaminophen (NORCO/VICODIN) 5-325 MG per tablet   Oral   Take 2 tablets by mouth every 6 (six) hours as needed for pain. Take stool softener. She has taken oxycodone before without problems,   60 tablet   2   . ibuprofen (ADVIL,MOTRIN) 200 MG tablet   Oral   Take 200 mg by mouth every 6 (six) hours as needed for pain.         Marland Kitchen levothyroxine (LEVOTHROID) 75 MCG tablet   Oral   Take 75 mcg by mouth daily before breakfast.          . lithium 300 MG tablet   Oral   Take 600 mg by mouth at bedtime.          Marland Kitchen MINIVELLE 0.075 MG/24HR   Transdermal   Place 1 patch onto the skin 2 (two) times a week.         . omega-3 acid ethyl esters (LOVAZA) 1 G capsule   Oral   Take 2 capsules (2 g total) by mouth 2 (two) times daily.   120 capsule   11   . QUEtiapine (SEROQUEL) 200 MG tablet    Oral   Take 100-200 mg by mouth 3 (three) times daily. Takes 200mg  in the morning, 100mg  in the afternoon and 200mg  at bedtime         .  rOPINIRole (REQUIP) 0.5 MG tablet   Oral   Take 0.5 mg by mouth at bedtime.          . rosuvastatin (CRESTOR) 10 MG tablet   Oral   Take 10 mg by mouth at bedtime.         . SitaGLIPtin-MetFORMIN HCl (JANUMET XR) [phone] MG TB24   Oral   Take 1 tablet by mouth at bedtime.         . Vitamin D, Ergocalciferol, (DRISDOL) 50000 UNITS CAPS   Oral   Take 1 capsule (50,000 Units total) by mouth every 7 (seven) days.   4 capsule   3   . buprenorphine (BUTRANS - DOSED MCG/HR) 10 MCG/HR PTWK   Transdermal   Place 1 patch (10 mcg total) onto the skin once a week.   4 patch   0    BP 143/79  Pulse 112  Temp(Src) 98.2 F (36.8 C) (Oral)  Resp 17  SpO2 96%   Filed Vitals:   11/03/12 1852 11/03/12 2032  BP: 143/79 148/85  Pulse: 112 111  Temp: 98.2 F (36.8 C)   TempSrc: Oral   Resp: 17 16  SpO2: 96% 99%    Physical Exam  Nursing note and vitals reviewed. Constitutional: She is oriented to person, place, and time. She appears well-developed and well-nourished. No distress.  HENT:  Head: Normocephalic and atraumatic.  Right Ear: External ear normal.  Left Ear: External ear normal.  Nose: Nose normal.  Mouth/Throat: Oropharynx is clear and moist. No oropharyngeal exudate.  Eyes: Conjunctivae and EOM are normal. Pupils are equal, round, and reactive to light. Right eye exhibits no discharge. Left eye exhibits no discharge.  Neck: Normal range of motion. Neck supple. No tracheal deviation present.  Cardiovascular: Normal rate, regular rhythm, normal heart sounds and intact distal pulses.  Exam reveals no gallop and no friction rub.   No murmur heard. Pulmonary/Chest: Effort normal and breath sounds normal. No respiratory distress. She has no wheezes. She has no rales. She exhibits no tenderness.  Abdominal: Soft. Bowel sounds are  normal. She exhibits no distension. There is no tenderness. There is no rebound and no guarding.  Musculoskeletal: Normal range of motion. She exhibits no edema and no tenderness.  Patient able to ambulate without difficulty or ataxia  Neurological: She is alert and oriented to person, place, and time.  GCS 15. No focal neurological deficits. Cranial nerves II through XII intact. Patellar reflexes present bilaterally. Strength 5 out of 5 in the upper and lower extremities.  Gross sensation intact in the upper and lower extremities.  Skin: Skin is warm and dry. She is not diaphoretic.  Psychiatric: She has a normal mood and affect. Her behavior is normal.    ED Course  Procedures (including critical care time)  Labs Review Labs Reviewed - No data to display  Imaging Review No results found.  Results for orders placed during the hospital encounter of 11/03/12  LITHIUM LEVEL      Result Value Range   Lithium Lvl <0.25 (*) 0.80 - 1.40 mEq/L  COMPREHENSIVE METABOLIC PANEL      Result Value Range   Sodium 134 (*) 135 - 145 mEq/L   Potassium 4.0  3.5 - 5.1 mEq/L   Chloride 103  96 - 112 mEq/L   CO2 21  19 - 32 mEq/L   Glucose, Bld 147 (*) 70 - 99 mg/dL   BUN 15  6 - 23 mg/dL   Creatinine, Ser  0.89  0.50 - 1.10 mg/dL   Calcium 96.0  8.4 - 45.4 mg/dL   Total Protein 7.4  6.0 - 8.3 g/dL   Albumin 4.1  3.5 - 5.2 g/dL   AST 15  0 - 37 U/L   ALT 16  0 - 35 U/L   Alkaline Phosphatase 98  39 - 117 U/L   Total Bilirubin 0.4  0.3 - 1.2 mg/dL   GFR calc non Af Amer 67 (*) >90 mL/min   GFR calc Af Amer 78 (*) >90 mL/min  CBC      Result Value Range   WBC 10.5  4.0 - 10.5 K/uL   RBC 4.99  3.87 - 5.11 MIL/uL   Hemoglobin 14.3  12.0 - 15.0 g/dL   HCT 09.8  11.9 - 14.7 %   MCV 86.6  78.0 - 100.0 fL   MCH 28.7  26.0 - 34.0 pg   MCHC 33.1  30.0 - 36.0 g/dL   RDW 82.9  56.2 - 13.0 %   Platelets 228  150 - 400 K/uL  URINALYSIS, ROUTINE W REFLEX MICROSCOPIC      Result Value Range   Color,  Urine YELLOW  YELLOW   APPearance CLEAR  CLEAR   Specific Gravity, Urine 1.017  1.005 - 1.030   pH 6.5  5.0 - 8.0   Glucose, UA NEGATIVE  NEGATIVE mg/dL   Hgb urine dipstick NEGATIVE  NEGATIVE   Bilirubin Urine NEGATIVE  NEGATIVE   Ketones, ur NEGATIVE  NEGATIVE mg/dL   Protein, ur NEGATIVE  NEGATIVE mg/dL   Urobilinogen, UA 1.0  0.0 - 1.0 mg/dL   Nitrite NEGATIVE  NEGATIVE   Leukocytes, UA NEGATIVE  NEGATIVE  GLUCOSE, CAPILLARY      Result Value Range   Glucose-Capillary 135 (*) 70 - 99 mg/dL    MDM   1. Restlessness and agitation   2. Anxiety     Vannie Hilgert is a 67 year old female with a PMH of fibromyalgia, anxiety, depression, DM, hepatic steatosis, bipolar disorder, ischemic colitis, OS, and dyslipidemia who presents to the ED for evaluation for restlessness. CBC, CMP, UA and lithium level ordered to further evaluate   Rechecks  7:55 PM = Patient wandering the hallway stating she "just cant sit still" 9:11 PM = HR checked by me: 102.  Patient states she feels anxious but just wants to go home and rest.     Etiology of restlessness and agitation possibly due to anxiety.  Patients physical exam revealed no acute abnormalities.  Her labs were WNL.  She had tachycardia which also may be due to her anxiety.  Patient had no complaints of SOB, chest pain, headache, dizziness or lightheadedness.  She has a history of anxiety and is on several medications for this.  She was instructed to follow-up with her psychiatrist and PCP tomorrow.  She was instructed to return to the ED if she has any severe headache, weakness, chest pain, SOB, repeated vomiting, or other concerns.  She was in agreement with discharge and plan.  She is driving herself home.  She lives with her husband.     Final impressions: 1. Restlessness and agitation  2. Anxiety, chronic     Thomasenia Sales    Jillyn Ledger, [place] 11/04/12 1116

## 2012-11-03 NOTE — ED Notes (Signed)
Pt states she has a restless feeling throughout her body that feels similar to her restless leg syndrome. Pt states this started a week ago and has gotten worse. Pt denies any rash or itching. Pt states she started Vicodin a week ago for her fibromyalgia pain and that is the only new medication she is on. Pt with no acute distress. Pt ambulatory to exam room with no acute distress.

## 2012-11-04 LAB — GLUCOSE, CAPILLARY: Glucose-Capillary: 135 mg/dL — ABNORMAL HIGH (ref 70–99)

## 2012-11-04 NOTE — Telephone Encounter (Signed)
Spoke with pt advised of MDs message.  She does not remember the reaction to Gabapentin.  Further states she has a call out to her Psychiatrist now.  Also states she went to the ER last night and advised she was not having an allergic reaction to the medication but a panic attack.

## 2012-11-06 ENCOUNTER — Telehealth (HOSPITAL_COMMUNITY): Payer: Self-pay | Admitting: Emergency Medicine

## 2012-11-07 NOTE — ED Provider Notes (Signed)
Medical screening examination/treatment/procedure(s) were performed by non-physician practitioner and as supervising physician I was immediately available for consultation/collaboration.    Gilda Crease, MD 11/07/12 [phone]

## 2012-11-25 ENCOUNTER — Other Ambulatory Visit: Payer: Self-pay | Admitting: Internal Medicine

## 2012-11-25 NOTE — Telephone Encounter (Signed)
Hydrocodone called to pharmacy  

## 2012-12-05 ENCOUNTER — Telehealth: Payer: Self-pay | Admitting: *Deleted

## 2012-12-05 ENCOUNTER — Other Ambulatory Visit: Payer: Self-pay | Admitting: Internal Medicine

## 2012-12-05 NOTE — Telephone Encounter (Signed)
OK for 30 day supply with 3 Rxs.

## 2012-12-05 NOTE — Telephone Encounter (Signed)
Spoke with pharmacist.  He advised current refills can be filled but after 10.6.2014 Hydrocodone refills will need to be hard copies.  30 day Rx not written today due to conversation with pharmacist.

## 2012-12-05 NOTE — Telephone Encounter (Signed)
Pt called requesting Hydrocodone refill; states pharmacy needs hard copy refill.  Please advise

## 2012-12-14 ENCOUNTER — Telehealth: Payer: Self-pay | Admitting: *Deleted

## 2012-12-14 NOTE — Telephone Encounter (Signed)
Complex patient. I thought she was going to see a pain specialist??  Need more information about her pain. If not hooked up with a pain specialist she can be added on to tomorrow's schedule.

## 2012-12-14 NOTE — Telephone Encounter (Signed)
Pt called states she is in severe pain.  please advise

## 2012-12-14 NOTE — Telephone Encounter (Signed)
Spoke with pt transferred to scheduling for appointment

## 2012-12-15 ENCOUNTER — Ambulatory Visit: Payer: BC Managed Care – PPO | Admitting: Internal Medicine

## 2012-12-16 ENCOUNTER — Other Ambulatory Visit: Payer: Self-pay | Admitting: Internal Medicine

## 2012-12-16 MED ORDER — HYDROCODONE-ACETAMINOPHEN 5-325 MG PO TABS: ORAL_TABLET | ORAL | Status: DC

## 2012-12-16 NOTE — Telephone Encounter (Signed)
Patient requesting refill on Hydrocodone, she canceled her appointment for this week please advise

## 2012-12-16 NOTE — Telephone Encounter (Signed)
Done hardcopy to robin  

## 2012-12-20 NOTE — Telephone Encounter (Signed)
I did not receive this hardcopy.

## 2012-12-20 NOTE — Telephone Encounter (Signed)
Sorry, I meant to forward to Dr Debby Bud who is PCP

## 2012-12-21 MED ORDER — HYDROCODONE-ACETAMINOPHEN 5-325 MG PO TABS: ORAL_TABLET | ORAL | Status: DC

## 2012-12-27 ENCOUNTER — Encounter: Payer: Self-pay | Admitting: Internal Medicine

## 2012-12-27 ENCOUNTER — Ambulatory Visit (INDEPENDENT_AMBULATORY_CARE_PROVIDER_SITE_OTHER): Payer: BC Managed Care – PPO | Admitting: Internal Medicine

## 2012-12-27 VITALS — BP 118/74 | HR 60 | Temp 97.5°F | Resp 16 | Ht 62.0 in | Wt 173.0 lb

## 2012-12-27 DIAGNOSIS — F341 Dysthymic disorder: Secondary | ICD-10-CM

## 2012-12-27 DIAGNOSIS — M797 Fibromyalgia: Secondary | ICD-10-CM

## 2012-12-27 DIAGNOSIS — I10 Essential (primary) hypertension: Secondary | ICD-10-CM

## 2012-12-27 DIAGNOSIS — IMO0001 Reserved for inherently not codable concepts without codable children: Secondary | ICD-10-CM

## 2012-12-27 DIAGNOSIS — E785 Hyperlipidemia, unspecified: Secondary | ICD-10-CM

## 2012-12-27 DIAGNOSIS — Z1239 Encounter for other screening for malignant neoplasm of breast: Secondary | ICD-10-CM

## 2012-12-27 MED ORDER — HYDROCODONE-ACETAMINOPHEN 5-325 MG PO TABS: ORAL_TABLET | ORAL | Status: DC

## 2012-12-27 MED ORDER — DIAZEPAM 5 MG PO TABS: 5.0000 mg | ORAL_TABLET | Freq: Two times a day (BID) | ORAL | Status: DC | PRN

## 2012-12-27 MED ORDER — LITHIUM CARBONATE 300 MG PO TABS: 900.0000 mg | ORAL_TABLET | Freq: Every day | ORAL | Status: DC

## 2012-12-27 MED ORDER — HYDROCODONE-ACETAMINOPHEN 10-325 MG PO TABS: 1.0000 | ORAL_TABLET | Freq: Three times a day (TID) | ORAL | Status: DC

## 2012-12-27 NOTE — Patient Instructions (Signed)
Good to see you.  1. Diabetes - continue your present medication and NO SUGAR low carb diet. Regular exericse  Follow up lab in November  2. Cholesterol - routine follow up labv in November  3. Pain management - it is best to control the pain rather than get out of pain. The goal is not to be pain free, rather pain controlled. Plan Adjust hydrocodone to 10 mg three times a day on schedule: AM, Mid-day, PM  4. Psych - continue with your medications as directed.   5. Health maintenance - flu shot today. [facility].

## 2012-12-28 NOTE — Assessment & Plan Note (Signed)
BP Readings from Last 3 Encounters:  12/27/12 118/74  11/03/12 148/85  10/20/12 110/84   Good control

## 2012-12-28 NOTE — Progress Notes (Signed)
  Subjective:    Patient ID: [name] [name], female    DOB: [birth year], 67 y.o.   MRN: [id number]  HPI Mrs. Keinath presents for follow up: she is being treated for DM, lipids, chronic pain management. In the interval since her last visit she has seen her psychiatrist and had several med changes - med rec brought up to date. She has been taking hydrocodone prn instead of on schedule and has had some break through pain. She has been adherent to a sugar free diet.   PMH, FamHx and SocHx reviewed for any changes and relevance.  Current Outpatient Prescriptions on File Prior to Visit  Medication Sig Dispense Refill  . ABILIFY 2 MG tablet Take 1 mg by mouth daily.       . cloNIDine (CATAPRES) 0.1 MG tablet Take 0.2 mg by mouth at bedtime.       Marland Kitchen ibuprofen (ADVIL,MOTRIN) 200 MG tablet Take 200 mg by mouth every 6 (six) hours as needed for pain.      Marland Kitchen levothyroxine (LEVOTHROID) 75 MCG tablet Take 75 mcg by mouth daily before breakfast.       . MINIVELLE 0.075 MG/24HR Place 1 patch onto the skin 2 (two) times a week.      . omega-3 acid ethyl esters (LOVAZA) 1 G capsule Take 2 capsules (2 g total) by mouth 2 (two) times daily.  120 capsule  11  . QUEtiapine (SEROQUEL) 200 MG tablet Take 100-200 mg by mouth 3 (three) times daily. Takes 200mg  in the morning, 100mg  in the afternoon and 200mg  at bedtime      . rOPINIRole (REQUIP) 0.5 MG tablet Take 0.5 mg by mouth at bedtime.       . rosuvastatin (CRESTOR) 10 MG tablet Take 10 mg by mouth at bedtime.      . SitaGLIPtin-MetFORMIN HCl (JANUMET XR) [phone] MG TB24 Take 1 tablet by mouth at bedtime.       No current facility-administered medications on file prior to visit.      Review of Systems System review is negative for any constitutional, cardiac, pulmonary, GI or neuro symptoms or complaints other than as described in the HPI. Specifically denies GI changes, increased somnolence.    Objective:   Physical Exam Filed Vitals:   12/27/12 0943   BP: 118/74  Pulse: 60  Temp: 97.5 F (36.4 C)  Resp: 16   Wt Readings from Last 3 Encounters:  12/27/12 173 lb (78.472 kg)  10/20/12 170 lb 12.8 oz (77.474 kg)  08/09/12 170 lb (77.111 kg)   Gen'l  Overweight weight white woman in no distress HEENT- C&S clear, PERRLA Cor- 2+ radial pulse, no peripheral edema Pulm - normal respirations, lungs CTAP Neuro - awake and alert.        Assessment & Plan:

## 2012-12-28 NOTE — Assessment & Plan Note (Signed)
Lab Results  Component Value Date   HGBA1C 6.1 10/20/2012    Diabetes - continue your present medication and NO SUGAR low carb diet. Regular exericse  Follow up lab in November

## 2012-12-28 NOTE — Assessment & Plan Note (Signed)
Pain management - it is best to control the pain rather than get out of pain. The goal is not to be pain free, rather pain controlled. Plan Adjust hydrocodone to 10 mg three times a day on schedule: AM, Mid-day, PM

## 2012-12-28 NOTE — Assessment & Plan Note (Signed)
Last LDL 180 and then she was started on crestor which she is tolerating  Plan - routine follow up labv in November

## 2012-12-28 NOTE — Assessment & Plan Note (Signed)
Psych - continue with your medications as directed.

## 2012-12-31 ENCOUNTER — Encounter (HOSPITAL_COMMUNITY): Payer: Self-pay | Admitting: Emergency Medicine

## 2012-12-31 ENCOUNTER — Emergency Department (HOSPITAL_COMMUNITY)
Admission: EM | Admit: 2012-12-31 | Discharge: 2012-12-31 | Disposition: A | Discharge status: Home / Self Care | Payer: BC Managed Care – PPO | Attending: Emergency Medicine | Admitting: Emergency Medicine

## 2012-12-31 ENCOUNTER — Emergency Department (HOSPITAL_COMMUNITY): Payer: BC Managed Care – PPO

## 2012-12-31 DIAGNOSIS — F341 Dysthymic disorder: Secondary | ICD-10-CM | POA: Insufficient documentation

## 2012-12-31 DIAGNOSIS — M545 Low back pain, unspecified: Secondary | ICD-10-CM | POA: Insufficient documentation

## 2012-12-31 DIAGNOSIS — R109 Unspecified abdominal pain: Secondary | ICD-10-CM

## 2012-12-31 DIAGNOSIS — Z8719 Personal history of other diseases of the digestive system: Secondary | ICD-10-CM | POA: Insufficient documentation

## 2012-12-31 DIAGNOSIS — Z8742 Personal history of other diseases of the female genital tract: Secondary | ICD-10-CM | POA: Insufficient documentation

## 2012-12-31 DIAGNOSIS — Z90710 Acquired absence of both cervix and uterus: Secondary | ICD-10-CM | POA: Insufficient documentation

## 2012-12-31 DIAGNOSIS — M19049 Primary osteoarthritis, unspecified hand: Secondary | ICD-10-CM | POA: Insufficient documentation

## 2012-12-31 DIAGNOSIS — Z8669 Personal history of other diseases of the nervous system and sense organs: Secondary | ICD-10-CM | POA: Insufficient documentation

## 2012-12-31 DIAGNOSIS — E119 Type 2 diabetes mellitus without complications: Secondary | ICD-10-CM | POA: Insufficient documentation

## 2012-12-31 DIAGNOSIS — IMO0001 Reserved for inherently not codable concepts without codable children: Secondary | ICD-10-CM | POA: Insufficient documentation

## 2012-12-31 DIAGNOSIS — F319 Bipolar disorder, unspecified: Secondary | ICD-10-CM | POA: Insufficient documentation

## 2012-12-31 DIAGNOSIS — Z79899 Other long term (current) drug therapy: Secondary | ICD-10-CM | POA: Insufficient documentation

## 2012-12-31 DIAGNOSIS — E785 Hyperlipidemia, unspecified: Secondary | ICD-10-CM | POA: Insufficient documentation

## 2012-12-31 DIAGNOSIS — R1084 Generalized abdominal pain: Secondary | ICD-10-CM | POA: Insufficient documentation

## 2012-12-31 LAB — CBC WITH DIFFERENTIAL/PLATELET
Basophils Absolute: 0 10*3/uL (ref 0.0–0.1)
Basophils Relative: 0 % (ref 0–1)
Eosinophils Absolute: 0.5 10*3/uL (ref 0.0–0.7)
Eosinophils Relative: 5 % (ref 0–5)
HCT: 41.8 % (ref 36.0–46.0)
Hemoglobin: 14.2 g/dL (ref 12.0–15.0)
Lymphocytes Relative: 18 % (ref 12–46)
Lymphs Abs: 1.8 10*3/uL (ref 0.7–4.0)
MCH: 29.5 pg (ref 26.0–34.0)
MCHC: 34 g/dL (ref 30.0–36.0)
MCV: 86.7 fL (ref 78.0–100.0)
Monocytes Absolute: 0.5 10*3/uL (ref 0.1–1.0)
Monocytes Relative: 5 % (ref 3–12)
Neutro Abs: 7.2 10*3/uL (ref 1.7–7.7)
Neutrophils Relative %: 72 % (ref 43–77)
Platelets: 209 10*3/uL (ref 150–400)
RBC: 4.82 MIL/uL (ref 3.87–5.11)
RDW: 13.5 % (ref 11.5–15.5)
WBC: 10 10*3/uL (ref 4.0–10.5)

## 2012-12-31 LAB — URINALYSIS, ROUTINE W REFLEX MICROSCOPIC
Bilirubin Urine: NEGATIVE
Glucose, UA: NEGATIVE mg/dL
Hgb urine dipstick: NEGATIVE
Ketones, ur: NEGATIVE mg/dL
Nitrite: NEGATIVE
Protein, ur: NEGATIVE mg/dL
Specific Gravity, Urine: 1.014 (ref 1.005–1.030)
Urobilinogen, UA: 0.2 mg/dL (ref 0.0–1.0)
pH: 6.5 (ref 5.0–8.0)

## 2012-12-31 LAB — COMPREHENSIVE METABOLIC PANEL
ALT: 16 U/L (ref 0–35)
AST: 15 U/L (ref 0–37)
Albumin: 4.1 g/dL (ref 3.5–5.2)
Alkaline Phosphatase: 99 U/L (ref 39–117)
BUN: 14 mg/dL (ref 6–23)
CO2: 25 mEq/L (ref 19–32)
Calcium: 10.7 mg/dL — ABNORMAL HIGH (ref 8.4–10.5)
Chloride: 100 mEq/L (ref 96–112)
Creatinine, Ser: 1.22 mg/dL — ABNORMAL HIGH (ref 0.50–1.10)
GFR calc Af Amer: 53 mL/min — ABNORMAL LOW (ref >90)
GFR calc non Af Amer: 46 mL/min — ABNORMAL LOW (ref >90)
Glucose, Bld: 123 mg/dL — ABNORMAL HIGH (ref 70–99)
Potassium: 3.9 mEq/L (ref 3.5–5.1)
Sodium: 134 mEq/L — ABNORMAL LOW (ref 135–145)
Total Bilirubin: 0.4 mg/dL (ref 0.3–1.2)
Total Protein: 7.4 g/dL (ref 6.0–8.3)

## 2012-12-31 LAB — LIPASE, BLOOD: Lipase: 34 U/L (ref 11–59)

## 2012-12-31 LAB — URINE MICROSCOPIC-ADD ON

## 2012-12-31 MED ORDER — OXYCODONE-ACETAMINOPHEN 5-325 MG PO TABS: 2.0000 | ORAL_TABLET | ORAL | Status: DC | PRN

## 2012-12-31 MED ORDER — SODIUM CHLORIDE 0.9 % IV BOLUS (SEPSIS)
1000.0000 mL | Freq: Once | INTRAVENOUS | Status: AC
  Administered 2012-12-31: 1000 mL via INTRAVENOUS

## 2012-12-31 MED ORDER — MORPHINE SULFATE 4 MG/ML IJ SOLN
4.0000 mg | Freq: Once | INTRAMUSCULAR | Status: AC
  Administered 2012-12-31: 4 mg via INTRAVENOUS
  Filled 2012-12-31: qty 1

## 2012-12-31 MED ORDER — ONDANSETRON HCL 4 MG/2ML IJ SOLN
4.0000 mg | Freq: Once | INTRAMUSCULAR | Status: AC
  Administered 2012-12-31: 4 mg via INTRAVENOUS
  Filled 2012-12-31: qty 2

## 2012-12-31 NOTE — ED Notes (Signed)
Patient transported to CT 

## 2012-12-31 NOTE — ED Provider Notes (Signed)
CSN: [id number]     Arrival date & time 12/31/12  1205 History   First MD Initiated Contact with Patient 12/31/12 1224     Chief Complaint  Patient presents with  . Abdominal Pain  . Back Pain   (Consider location/radiation/quality/duration/timing/severity/associated sxs/prior Treatment) HPI Comments: Patient with past medical history of depression, fibromyalgia, bipolar, ischemic colitis. She presents today with complaints of generalized abdominal cramping and loose stools that have been going on for the past several days. She denies to me she is having any fevers or chills and denies any bloody or black bowel movements. She was admitted one year ago for colitis and she feels as though this is flaring up again. She denies any recent antibiotic use and denies any ill contacts.  Patient is a 67 y.o. female presenting with abdominal pain and back pain. The history is provided by the patient.  Abdominal Pain Pain location:  Generalized Pain quality: cramping   Pain radiates to:  Does not radiate Pain severity:  Moderate Onset quality:  Gradual Duration:  3 days Timing:  Constant Progression:  Worsening Chronicity:  New Relieved by:  Nothing Worsened by:  Movement, eating, palpation and position changes Ineffective treatments:  None tried Associated symptoms: no chills, no fever and no shortness of breath   Back Pain Associated symptoms: abdominal pain   Associated symptoms: no fever     Past Medical History  Diagnosis Date  . Bilateral hearing loss   . Otitis media, chronic   . Urinary, incontinence, stress female   . Dyslipidemia   . Allergic rhinitis, seasonal   . Osteoarthritis cervical spine   . Osteoarthritis of hand     bilateral  . Alcohol abuse, in remission   . Colitis, ischemic 2012  . Sciatica   . Diverticulitis 2012  . Alcohol abuse   . Depression with anxiety   . Bipolar 1 disorder   . Hepatic steatosis 06/18/12    severe  . Diabetes mellitus without  complication   . Depression   . Anxiety    Past Surgical History  Procedure Laterality Date  . D&c x 4    . Btl laproscopic    . Abdominal hysterectomy    . Tonsillectomy    . Excision of tumors at right neck      angle of jaw '68, benign  . Hearing loss      BIL HEARING LOSS  . Otitis    . Urinary incontinence    . Dislipidemia    . Flexible sigmoidoscopy N/A 06/21/2012    Procedure: FLEXIBLE SIGMOIDOSCOPY;  Surgeon: Beverley Fiedler, MD;  Location: [facility];  Service: Gastroenterology;  Laterality: N/A;  . Appendectomy    . Tubal ligation     Family History  Problem Relation Age of Onset  . Diabetes Brother   . Alcohol abuse Brother     x 2  . Drug abuse Mother   . Alcohol abuse Mother   . Alcohol abuse Father   . Colon cancer Paternal Uncle    History  Substance Use Topics  . Smoking status: Never Smoker   . Smokeless tobacco: Never Used  . Alcohol Use: No     Comment: alcohol abuse formally; has been abstinent x 4.5 years   OB History   Grav Para Term Preterm Abortions TAB SAB Ect Mult Living                 Review of Systems  Constitutional: Negative for fever  and chills.  Respiratory: Negative for shortness of breath.   Gastrointestinal: Positive for abdominal pain.  Musculoskeletal: Positive for back pain.  All other systems reviewed and are negative.    Allergies  Azithromycin; Erythromycin; Codeine; Neurontin; Procaine hcl; and Sulfur  Home Medications   Current Outpatient Rx  Name  Route  Sig  Dispense  Refill  . ABILIFY 2 MG tablet   Oral   Take 1 mg by mouth daily.          . cloNIDine (CATAPRES) 0.1 MG tablet   Oral   Take 0.2 mg by mouth at bedtime.          . diazepam (VALIUM) 5 MG tablet   Oral   Take 1 tablet (5 mg total) by mouth every 12 (twelve) hours as needed for anxiety.   30 tablet      . HYDROcodone-acetaminophen (NORCO) 10-325 MG per tablet   Oral   Take 1 tablet by mouth 3 (three) times daily. Take on schedule    90 tablet   0   . ibuprofen (ADVIL,MOTRIN) 200 MG tablet   Oral   Take 200 mg by mouth every 6 (six) hours as needed for pain.         Marland Kitchen levothyroxine (LEVOTHROID) 75 MCG tablet   Oral   Take 75 mcg by mouth daily before breakfast.          . lithium 300 MG tablet   Oral   Take 3 tablets (900 mg total) by mouth at bedtime.         Marland Kitchen MINIVELLE 0.075 MG/24HR   Transdermal   Place 1 patch onto the skin 2 (two) times a week.         . omega-3 acid ethyl esters (LOVAZA) 1 G capsule   Oral   Take 2 capsules (2 g total) by mouth 2 (two) times daily.   120 capsule   11   . QUEtiapine (SEROQUEL) 200 MG tablet   Oral   Take 100-200 mg by mouth 3 (three) times daily. Takes 200mg  in the morning, 100mg  in the afternoon and 200mg  at bedtime         . rOPINIRole (REQUIP) 0.5 MG tablet   Oral   Take 0.5 mg by mouth at bedtime.          . rosuvastatin (CRESTOR) 10 MG tablet   Oral   Take 10 mg by mouth at bedtime.         . SitaGLIPtin-MetFORMIN HCl (JANUMET XR) [phone] MG TB24   Oral   Take 1 tablet by mouth at bedtime.          BP 149/67  Pulse 92  Temp(Src) 98 F (36.7 C) (Oral)  Resp 22  Ht 5\' 2"  (1.575 m)  Wt 170 lb (77.111 kg)  BMI 31.09 kg/m2  SpO2 98% Physical Exam  Nursing note and vitals reviewed. Constitutional: She is oriented to person, place, and time. She appears well-developed and well-nourished. No distress.  HENT:  Head: Normocephalic and atraumatic.  Neck: Normal range of motion. Neck supple.  Cardiovascular: Normal rate and regular rhythm.  Exam reveals no gallop and no friction rub.   No murmur heard. Pulmonary/Chest: Effort normal and breath sounds normal. No respiratory distress. She has no wheezes.  Abdominal: Soft. Bowel sounds are normal. She exhibits no distension. There is tenderness.  There is mild tenderness to palpation in all 4 quadrants with no rebound or guarding. Bowel sounds are present.  Musculoskeletal: Normal range  of motion.  Neurological: She is alert and oriented to person, place, and time.  Skin: Skin is warm and dry. She is not diaphoretic.    ED Course  Procedures (including critical care time) Labs Review Labs Reviewed  CBC WITH DIFFERENTIAL  COMPREHENSIVE METABOLIC PANEL  LIPASE, BLOOD  URINALYSIS, ROUTINE W REFLEX MICROSCOPIC   Imaging Review No results found.  EKG Interpretation   None       MDM  No diagnosis found. Patient is a 66 year old female with past medical history fibromyalgia, bipolar, depression, colitis. She presents with complaints of generalized abdominal pain that is worsened over the past several days. Physical examination reveals generalized abdominal tenderness however no distention and no peritoneal signs. Workup reveals no elevation of white count, clear urine, and CT of the abdomen and pelvis is negative for acute intra-abdominal process. She is feeling better with medications and fluids in the ED and I feel as though she is stable for discharge to home. I've advised her to followup with her primary Dr. if not improving in the next several days and return to the ER if her symptoms worsen or change.    Geoffery Lyons, MD 12/31/12 [phone]

## 2012-12-31 NOTE — ED Notes (Signed)
Pt presents with lower abdominal pain, cramping in nature. Pt denies n/v but says she has had some loose stools. Pt also c/o lower back pain with no associated urinary symptoms. Pt says she has had these symptoms for a over a week now.

## 2013-01-02 ENCOUNTER — Encounter: Payer: Self-pay | Admitting: Internal Medicine

## 2013-01-02 ENCOUNTER — Ambulatory Visit (INDEPENDENT_AMBULATORY_CARE_PROVIDER_SITE_OTHER): Payer: BC Managed Care – PPO | Admitting: Internal Medicine

## 2013-01-02 VITALS — BP 136/90 | HR 92 | Temp 98.1°F | Wt 171.0 lb

## 2013-01-02 DIAGNOSIS — K55039 Acute (reversible) ischemia of large intestine, extent unspecified: Secondary | ICD-10-CM

## 2013-01-02 DIAGNOSIS — K589 Irritable bowel syndrome without diarrhea: Secondary | ICD-10-CM | POA: Insufficient documentation

## 2013-01-02 DIAGNOSIS — K55059 Acute (reversible) ischemia of intestine, part and extent unspecified: Secondary | ICD-10-CM

## 2013-01-02 DIAGNOSIS — M545 Low back pain, unspecified: Secondary | ICD-10-CM

## 2013-01-02 MED ORDER — HYOSCYAMINE SULFATE 0.125 MG SL SUBL: 0.1250 mg | SUBLINGUAL_TABLET | SUBLINGUAL | Status: DC | PRN
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
## 2013-01-02 NOTE — Patient Instructions (Addendum)
1. GI - reviewed ED notes, labs and imaging: normal lab except for mildly abnormal kidney function with creatinine of 1.22 up from previously normal 0.89 and mildly elevated serum glucose (good for you in regard to diabetes). CT scan of the abdomen and pelvis was normal. Reviewed hospital records from April '14 when admitted for ischemic colitis (loss of good blood flow to the gut) and the follow up visit to Dr. Juanda Chance in May 27th where she opined that the cause of the ischemic colits was low blood flow possibly due to clonidine. Her plan was to reduce the clonidine to once and and to stop if possible.   Your symptoms due suggest early symptoms low blood flow related colitis and possible mild renal insufficience also due to low blood flow.   Plan Reduce clonidine to once a day in the evening for 5 days and then stop clonidine  Please check your blood pressure as we stop the clonidine for any elevation in Blood pressure  Check with your psychiatrist about medication: could he have meant "Klonipin" instead of clonidine for control of nightmares??  For severe cramping - Levsin SL every 3 hrs as needed: chew fine and swallow  2. Low back pain - on watching your movement during the exam there is no evidence of a pinched nerve, radiculopathy.  Plan Go to YouTube.com and search for low back exercise and stretches  Continue the hydrocodone with tylenol   OK to also take NSAID drug, i.e. Aleve twice a day  Heat is good: use heat patches, [employer] makes a product.

## 2013-01-02 NOTE — Assessment & Plan Note (Signed)
GI - reviewed ED notes, labs and imaging: normal lab except for mildly abnormal kidney function with creatinine of 1.22 up from previously normal 0.89 and mildly elevated serum glucose (good for you in regard to diabetes). CT scan of the abdomen and pelvis was normal. Reviewed hospital records from April '14 when admitted for ischemic colitis (loss of good blood flow to the gut) and the follow up visit to Dr. Juanda Chance in May 27th where she opined that the cause of the ischemic colits was low blood flow possibly due to clonidine. Her plan was to reduce the clonidine to once and and to stop if possible.   Your symptoms due suggest early symptoms low blood flow related colitis and possible mild renal insufficience also due to low blood flow.   Plan Reduce clonidine to once a day in the evening for 5 days and then stop clonidine  Please check your blood pressure as we stop the clonidine for any elevation in Blood pressure  Check with your psychiatrist about medication: could he have meant "Klonipin" instead of clonidine for control of nightmares??  For severe cramping - Levsin SL every 3 hrs as needed: chew fine and swallow

## 2013-01-02 NOTE — Assessment & Plan Note (Signed)
Low back pain - on watching your movement during the exam there is no evidence of a pinched nerve, radiculopathy.  Plan Go to [employer] and search for low back exercise and stretches  Continue the hydrocodone with tylenol   OK to also take NSAID drug, i.e. Aleve twice a day  Heat is good: use heat patches, [employer] makes a product.

## 2013-01-12 ENCOUNTER — Encounter: Payer: Self-pay | Admitting: Internal Medicine

## 2013-01-12 ENCOUNTER — Telehealth: Payer: Self-pay | Admitting: *Deleted

## 2013-01-12 ENCOUNTER — Telehealth: Payer: Self-pay | Admitting: Internal Medicine

## 2013-01-12 ENCOUNTER — Ambulatory Visit (INDEPENDENT_AMBULATORY_CARE_PROVIDER_SITE_OTHER): Payer: BC Managed Care – PPO | Admitting: Internal Medicine

## 2013-01-12 VITALS — BP 150/86 | HR 98 | Temp 99.0°F | Wt 171.0 lb

## 2013-01-12 DIAGNOSIS — M545 Low back pain, unspecified: Secondary | ICD-10-CM

## 2013-01-12 MED ORDER — CLOTRIMAZOLE-BETAMETHASONE 1-0.05 % EX CREA: 1.0000 "application " | TOPICAL_CREAM | Freq: Two times a day (BID) | CUTANEOUS | Status: DC

## 2013-01-12 MED ORDER — PREDNISONE 10 MG PO TABS: 10.0000 mg | ORAL_TABLET | ORAL | Status: DC

## 2013-01-12 NOTE — Telephone Encounter (Signed)
Spoke with pt advised of MDs message 

## 2013-01-12 NOTE — Telephone Encounter (Signed)
Best is to have a follow up visit to sort out the origin of her pain.  No increase in medications until seen.

## 2013-01-12 NOTE — Patient Instructions (Signed)
1. Acute low back pain worse on the right with associated radicular symptoms: decreased reflex, pain with straight leg move both sitting and supine, pain with heel walk, unable to step up to the exam table, decreased sensation distal right leg. These findings suggest a herniated disk at the L4-5, L5-S1 level worse on the right.  Plan Continue norco and aleve  Continue heat  Start prednisone burst and taper tonight: 60 mg tonight (may interfere with sleep), 40 mg daily x 3 days, 20 mg daily x 3 days, 10 mg daily x 6 days.  Return in the AM for x-rays of the lumbar spine - 8:30 AM or so. Will review results and if needed work to get an MRI lumbar spine Friday PM or Sat AM/.  2. Rash on ring finger - looks like a dermatitis Plan  Apply a small amount of lotrisone cream twice a day  3. Please ask your psychiatrist to refer to the specialists in the [place] system or have me set up referrals so that we have  Access to information. Especially important in a complicated patient. Please have the neurologist send [place] a copy of his evaluation and future notes.      Herniated Disk The bones of your spinal column (vertebrae) protect your spinal cord and nerves that go into your arms and legs. The vertebrae are separated by disks that cushion the spinal column and put space between your vertebrae. This allows movement between the vertebrae, which allows you to bend, rotate, and move your body from side to side. Sometimes, the disks move out of place (herniate) or break open (rupture) from injury or strain. The most common area for a disk herniation is in the lower back (lumbar area). Sometimes herniation occurs in the neck (cervical) disks.  CAUSES  As we grow older, the strong, fibrous cords that connect the vertebrae and support and surround the disks (ligaments) start to weaken. A strain on the back may cause a break in the disk ligaments. RISK FACTORS Herniated disks occur most often in men who are  aged 18 years to 35 years, usually after strenuous activity. Other risk factors include conditions present at birth (congenital) that affect the size of the lumbar spinal canal. Additionally, a narrowing of the areas where the nerves exit the spinal canal can occur as you age. SYMPTOMS  Symptoms of a herniated disk vary. You may have weakness in certain muscles. This weakness can include difficulty lifting your leg or arm, difficulty standing on your toes on one side, or difficulty squeezing tightly with one of your hands. You may have numbness. You may feel a mild tingling, dull ache, or a burning or pulsating pain. In some cases, the pain is severe enough that you are unable to move. The pain most often occurs on one side of the body. The pain often starts slowly. It may get worse:  After you sit or stand.  At night.  When you sneeze, cough, or laugh.  When you bend backwards or walk more than a few yards. The pain, numbness, or weakness will often go away or improve a lot over a period of weeks to months. Herniated lumbar disk Symptoms of a herniated lumbar disk may include sharp pain in one part of your leg, hip, or buttocks and numbness in other parts. You also may feel pain or numbness on the back of your calf or the top or sole of your foot. The same leg also may feel weak.  Herniated cervical disk Symptoms of a herniated cervical disk may include pain when you move your neck, deep pain near or over your shoulder blade, or pain that moves to your upper arm, forearm, or fingers. DIAGNOSIS  To diagnose a herniated disk, your caregiver will perform a physical exam. Your caregiver also may perform diagnostic tests to see your disk or to test the reaction of your muscles and the function of your nerves. During the physical exam, your caregiver may ask you to:  Sit, stand, and walk. While you walk, your caregiver may ask you to try walking on your toes and then your heels.  Bend forward,  backward, and sideways.  Raise your shoulders, elbow, wrist, and fingers and check your strength during these tasks. Your caregiver will check for:  Numbness or loss of feeling.  Muscle reflexes, which may be slower or missing.  Muscle strength, which may be weaker.  Posture or the way your spine curves. Diagnostic tests that may be done include:  A spinal X-ray exam to rule out other causes of back pain.  Magnetic resonance imaging (MRI) or computed tomography (CT) scan, which will show if the herniated disk is pressing on your spinal canal.  Electromyography. This is sometimes used to identify the specific area of nerve involvement. TREATMENT  Initial treatment for a herniated disk is a short period of rest with medicines for pain. Pain medicines can include nonsteroidal anti-inflammatory medicines (NSAIDs), muscle relaxants for back spasms, and (rarely) narcotic pain medicine for severe pain that does not respond to NSAID use. Bed rest is often limited to 1 or 2 days at the most because prolonged rest can delay recovery. When the herniation involves the lower back, sitting should be avoided as much as possible because sitting increases pressure on the ruptured disk. Sometimes a soft neck collar will be prescribed for a few days to weeks to help support your neck in the case of a cervical herniation. Physical therapy is often prescribed for patients with disk disease. Physical therapists will teach you how to properly lift, dress, walk, and perform other activities. They will work on strengthening the muscles that help support your spine. In some cases, physical therapy alone is not enough to treat a herniated disk. Steroid injections along the involved nerve root may be needed to help control pain. The steroid is injected in the area of the herniated disk and helps by reducing swelling around the disk. Sometimes surgery is the best option to treat a herniated disk.  SEEK IMMEDIATE MEDICAL  CARE IF:   You have numbness, tingling, weakness, or problems with the use of your arms or legs.  You have severe headaches that are not relieved with the use of medicines.  You notice a change in your bowel or bladder control.  You have increasing pain in any areas of your body.  You experience shortness of breath, dizziness, or fainting. MAKE SURE YOU:   Understand these instructions.  Will watch your condition.  Will get help right away if you are not doing well or get worse. Document Released: 02/21/2000 Document Revised: 05/18/2011 Document Reviewed: 09/26/2010 [facility] Patient Information 2014 [place], [place].

## 2013-01-12 NOTE — Progress Notes (Signed)
Pre visit review using our clinic review tool, if applicable. No additional management support is needed unless otherwise documented below in the visit note. 

## 2013-01-12 NOTE — Telephone Encounter (Signed)
Pt called states the Hydrocodone is not effective.  Please advise
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
## 2013-01-12 NOTE — Telephone Encounter (Signed)
Recd records from [facility], forwarding 4pgs to Dr.Norins

## 2013-01-13 ENCOUNTER — Ambulatory Visit (INDEPENDENT_AMBULATORY_CARE_PROVIDER_SITE_OTHER)
Admission: RE | Admit: 2013-01-13 | Discharge: 2013-01-13 | Disposition: A | Discharge status: Home / Self Care | Payer: BC Managed Care – PPO | Source: Ambulatory Visit | Attending: Internal Medicine | Admitting: Internal Medicine

## 2013-01-13 ENCOUNTER — Telehealth: Payer: Self-pay | Admitting: *Deleted

## 2013-01-13 DIAGNOSIS — M545 Low back pain, unspecified: Secondary | ICD-10-CM

## 2013-01-13 DIAGNOSIS — M5441 Lumbago with sciatica, right side: Secondary | ICD-10-CM

## 2013-01-13 NOTE — Telephone Encounter (Signed)
Unable to contact pt with MDs message

## 2013-01-13 NOTE — Telephone Encounter (Signed)
Lumbar disk disease - cannot tell if worse on plain films. Request has been relayed to [facility] to schedule MRI lumbar spine at [facility] radiology [address] for this PM or tomorrow AM

## 2013-01-13 NOTE — Telephone Encounter (Signed)
Spoke with pt advised of MDs message and referral sent

## 2013-01-13 NOTE — Telephone Encounter (Signed)
Pt called requesting Xray results.  Please advise 

## 2013-01-15 NOTE — Assessment & Plan Note (Signed)
Acute on chronic low back pain. Exam with several radicular findings. L-S spine x-rays: IMPRESSION:  1. No acute findings.  2. Mild scoliotic curvature of the thoracolumbar spine.  3. Mild multilevel lumbar spine DDD.  Plan  With exacerbation of back pain and abnormal exam with DDD on plain films will move ahead with repeat MRI to f/o acute HNP and nerve impingement.

## 2013-01-17 ENCOUNTER — Telehealth: Payer: Self-pay | Admitting: *Deleted

## 2013-01-17 NOTE — Telephone Encounter (Signed)
No report in epic. When did she have the study. She needs to understand that she doesn't have to call. Reports are cleared and communicated through MyChart daily if not more frequently.  When the result is available that is how she will get the results - thru MyChart.

## 2013-01-17 NOTE — Telephone Encounter (Signed)
t called requesting MRI results.  Please advise

## 2013-01-17 NOTE — Telephone Encounter (Signed)
Spoke with pt advised of MDs message 

## 2013-01-18 ENCOUNTER — Encounter: Payer: Self-pay | Admitting: Internal Medicine

## 2013-01-22 ENCOUNTER — Other Ambulatory Visit: Payer: Self-pay | Admitting: Internal Medicine

## 2013-01-22 ENCOUNTER — Encounter: Payer: Self-pay | Admitting: Internal Medicine

## 2013-01-23 ENCOUNTER — Telehealth: Payer: Self-pay | Admitting: *Deleted

## 2013-01-23 ENCOUNTER — Other Ambulatory Visit (INDEPENDENT_AMBULATORY_CARE_PROVIDER_SITE_OTHER): Payer: BC Managed Care – PPO

## 2013-01-23 ENCOUNTER — Other Ambulatory Visit: Payer: Self-pay

## 2013-01-23 DIAGNOSIS — IMO0001 Reserved for inherently not codable concepts without codable children: Secondary | ICD-10-CM

## 2013-01-23 DIAGNOSIS — E785 Hyperlipidemia, unspecified: Secondary | ICD-10-CM

## 2013-01-23 LAB — LIPID PANEL
Cholesterol: 140 mg/dL (ref 0–200)
HDL: 51.6 mg/dL (ref >39.00)
Total CHOL/HDL Ratio: 3
Triglycerides: 255 mg/dL — ABNORMAL HIGH (ref 0.0–149.0)
VLDL: 51 mg/dL — ABNORMAL HIGH (ref 0.0–40.0)

## 2013-01-23 LAB — HEPATIC FUNCTION PANEL
ALT: 20 U/L (ref 0–35)
AST: 19 U/L (ref 0–37)
Albumin: 3.8 g/dL (ref 3.5–5.2)
Alkaline Phosphatase: 83 U/L (ref 39–117)
Bilirubin, Direct: 0.1 mg/dL (ref 0.0–0.3)
Total Bilirubin: 0.6 mg/dL (ref 0.3–1.2)
Total Protein: 6.8 g/dL (ref 6.0–8.3)

## 2013-01-23 LAB — COMPREHENSIVE METABOLIC PANEL
ALT: 20 U/L (ref 0–35)
AST: 19 U/L (ref 0–37)
Albumin: 3.8 g/dL (ref 3.5–5.2)
Alkaline Phosphatase: 83 U/L (ref 39–117)
BUN: 9 mg/dL (ref 6–23)
CO2: 26 mEq/L (ref 19–32)
Calcium: 10 mg/dL (ref 8.4–10.5)
Chloride: 103 mEq/L (ref 96–112)
Creatinine, Ser: 1.1 mg/dL (ref 0.4–1.2)
GFR: 51.89 mL/min — ABNORMAL LOW (ref >60.00)
Glucose, Bld: 165 mg/dL — ABNORMAL HIGH (ref 70–99)
Potassium: 4.3 mEq/L (ref 3.5–5.1)
Sodium: 136 mEq/L (ref 135–145)
Total Bilirubin: 0.6 mg/dL (ref 0.3–1.2)
Total Protein: 6.8 g/dL (ref 6.0–8.3)

## 2013-01-23 LAB — HEMOGLOBIN A1C: Hgb A1c MFr Bld: 6.5 % (ref 4.6–6.5)

## 2013-01-23 LAB — LDL CHOLESTEROL, DIRECT: Direct LDL: 70.6 mg/dL

## 2013-01-23 MED ORDER — HYDROCODONE-ACETAMINOPHEN 10-325 MG PO TABS: 1.0000 | ORAL_TABLET | Freq: Three times a day (TID) | ORAL | Status: DC

## 2013-01-23 NOTE — Telephone Encounter (Signed)
MRI was no c/w HNP, not sure the origin of her pain. Can add on to tomorrow's schedule.

## 2013-01-23 NOTE — Telephone Encounter (Signed)
Spoke with pt, pt to be added to Dr Debby Bud 11.18.14 at 11 am

## 2013-01-23 NOTE — Telephone Encounter (Signed)
Pt called states she is having low back and right leg pain, states she is unable to bear weight on right leg.  Please advise

## 2013-01-24 ENCOUNTER — Encounter: Payer: Self-pay | Admitting: Internal Medicine

## 2013-01-24 ENCOUNTER — Ambulatory Visit (INDEPENDENT_AMBULATORY_CARE_PROVIDER_SITE_OTHER): Payer: Medicare Other | Admitting: Internal Medicine

## 2013-01-24 VITALS — BP 150/90 | HR 89 | Temp 97.3°F | Wt 173.0 lb

## 2013-01-24 DIAGNOSIS — M545 Low back pain, unspecified: Secondary | ICD-10-CM

## 2013-01-24 NOTE — Patient Instructions (Signed)
Low back pain:  Non - radicular exam. Pain is better now on lyrica and hydrocodone. MRI - by recall did not reveal any herniated disk or compressed nerve root  Plan No need for a surgical consultation.  Continue present medications  Refer PT [facility].    Back Pain, Adult Low back pain is very common. About 1 in 5 people have back pain.The cause of low back pain is rarely dangerous. The pain often gets better over time.About half of people with a sudden onset of back pain feel better in just 2 weeks. About 8 in 10 people feel better by 6 weeks.  CAUSES Some common causes of back pain include:  Strain of the muscles or ligaments supporting the spine.  Wear and tear (degeneration) of the spinal discs.  Arthritis.  Direct injury to the back. DIAGNOSIS Most of the time, the direct cause of low back pain is not known.However, back pain can be treated effectively even when the exact cause of the pain is unknown.Answering your caregiver's questions about your overall health and symptoms is one of the most accurate ways to make sure the cause of your pain is not dangerous. If your caregiver needs more information, he or she may order lab work or imaging tests (X-rays or MRIs).However, even if imaging tests show changes in your back, this usually does not require surgery. HOME CARE INSTRUCTIONS For many people, back pain returns.Since low back pain is rarely dangerous, it is often a condition that people can learn to [facility] their own.   Remain active. It is stressful on the back to sit or stand in one place. Do not sit, drive, or stand in one place for more than 30 minutes at a time. Take short walks on level surfaces as soon as pain allows.Try to increase the length of time you walk each day.  Do not stay in bed.Resting more than 1 or 2 days can delay your recovery.  Do not avoid exercise or work.Your body is made to move.It is not dangerous to be active, even  though your back may hurt.Your back will likely heal faster if you return to being active before your pain is gone.  Pay attention to your body when you bend and lift. Many people have less discomfortwhen lifting if they bend their knees, keep the load close to their bodies,and avoid twisting. Often, the most comfortable positions are those that put less stress on your recovering back.  Find a comfortable position to sleep. Use a firm mattress and lie on your side with your knees slightly bent. If you lie on your back, put a pillow under your knees.  Only take over-the-counter or prescription medicines as directed by your caregiver. Over-the-counter medicines to reduce pain and inflammation are often the most helpful.Your caregiver may prescribe muscle relaxant drugs.These medicines help dull your pain so you can more quickly return to your normal activities and healthy exercise.  Put ice on the injured area.  Put ice in a plastic bag.  Place a towel between your skin and the bag.  Leave the ice on for 15-20 minutes, 03-04 times a day for the first 2 to 3 days. After that, ice and heat may be alternated to reduce pain and spasms.  Ask your caregiver about trying back exercises and gentle massage. This may be of some benefit.  Avoid feeling anxious or stressed.Stress increases muscle tension and can worsen back pain.It is important to recognize when you are anxious  or stressed and learn ways to manage it.Exercise is a great option. SEEK MEDICAL CARE IF:  You have pain that is not relieved with rest or medicine.  You have pain that does not improve in 1 week.  You have new symptoms.  You are generally not feeling well. SEEK IMMEDIATE MEDICAL CARE IF:   You have pain that radiates from your back into your legs.  You develop new bowel or bladder control problems.  You have unusual weakness or numbness in your arms or legs.  You develop nausea or vomiting.  You develop  abdominal pain.  You feel faint. Document Released: 02/23/2005 Document Revised: 08/25/2011 Document Reviewed: 07/14/2010 [facility] Patient Information 2014 [place], [place].

## 2013-01-24 NOTE — Assessment & Plan Note (Signed)
Non - radicular exam. Pain is better now on lyrica and hydrocodone.  Plan Continue present medications  Refer PT [facility].

## 2013-01-24 NOTE — Progress Notes (Signed)
Pre visit review using our clinic review tool, if applicable. No additional management support is needed unless otherwise documented below in the visit note. 

## 2013-01-25 NOTE — Progress Notes (Signed)
  Subjective:    Patient ID: [name] [name], female    DOB: January 06, 1946, 67 y.o.   MRN: [id number]  HPI Mrs. Percival presents for follow up of low back pain. She had several calls to the office about the severity of her pain in the right leg - limiting her ability to walk.  She denies any incontinence of bowel or bladder, no foot drop, no focal motor weakness, mild paresthesia. She did have an MRI at [facility] - had reviewed report (not scanned at this time) with no acute injury or root compression, no compression fracture. She is on hydrocodone and her psychiatrist has started lyrica. She admits that her pain is better today.  PMH, FamHx and SocHx reviewed for any changes and relevance.  Current Outpatient Prescriptions on File Prior to Visit  Medication Sig Dispense Refill  . clotrimazole-betamethasone (LOTRISONE) cream Apply 1 application topically 2 (two) times daily.  30 g  0  . diazepam (VALIUM) 5 MG tablet Take 1 tablet (5 mg total) by mouth every 12 (twelve) hours as needed for anxiety.  30 tablet    . HYDROcodone-acetaminophen (NORCO) 10-325 MG per tablet Take 1 tablet by mouth 3 (three) times daily. Take on schedule  90 tablet  0  . levothyroxine (LEVOTHROID) 75 MCG tablet Take 75 mcg by mouth daily before breakfast.       . lithium 300 MG tablet Take 3 tablets (900 mg total) by mouth at bedtime.      Marland Kitchen LORazepam (ATIVAN) 1 MG tablet Take 1 mg by mouth every 8 (eight) hours.       Marland Kitchen MINIVELLE 0.075 MG/24HR Place 1 patch onto the skin 2 (two) times a week.      . omega-3 acid ethyl esters (LOVAZA) 1 G capsule Take 1 g by mouth daily.      . QUEtiapine (SEROQUEL) 200 MG tablet Take 100 mg by mouth 4 (four) times daily. Take 1/2 tablet at dinner and bedtime      . rOPINIRole (REQUIP) 0.5 MG tablet Take 0.5 mg by mouth at bedtime.       . rosuvastatin (CRESTOR) 10 MG tablet Take 10 mg by mouth at bedtime.      . SitaGLIPtin-MetFORMIN HCl (JANUMET XR) [phone] MG TB24 Take 1  tablet by mouth at bedtime.       No current facility-administered medications on file prior to visit.      Review of Systems System review is negative for any constitutional, cardiac, pulmonary, GI or neuro symptoms or complaints other than as described in the HPI.     Objective:   Physical Exam Filed Vitals:   01/24/13 1719  BP: 150/90  Pulse: 89  Temp: 97.3 F (36.3 C)   gen'l- WNWD white woman in no distress. Able to move in and out of the exam room w/o assist. Cor- RRR Pulm - normal respirations MSK - Back exam: normal stand; normal flex to greater than 100 degrees; normal gait; normal toe/heel walk; normal step up to exam table; normal SLR sitting; normal DTRs at the patellar tendons; normal sensation to light touch, pin-prick and deep vibratory stimulus; no  CVA tenderness; able to move supine to sitting witout assistance.        Assessment & Plan:

## 2013-02-06 ENCOUNTER — Ambulatory Visit: Payer: BC Managed Care – PPO | Attending: Internal Medicine

## 2013-02-06 DIAGNOSIS — IMO0001 Reserved for inherently not codable concepts without codable children: Secondary | ICD-10-CM | POA: Insufficient documentation

## 2013-02-06 DIAGNOSIS — R5381 Other malaise: Secondary | ICD-10-CM | POA: Insufficient documentation

## 2013-02-06 DIAGNOSIS — M545 Low back pain, unspecified: Secondary | ICD-10-CM | POA: Insufficient documentation

## 2013-02-07 ENCOUNTER — Other Ambulatory Visit: Payer: Self-pay | Admitting: Internal Medicine

## 2013-02-07 ENCOUNTER — Ambulatory Visit
Admission: RE | Admit: 2013-02-07 | Discharge: 2013-02-07 | Disposition: A | Discharge status: Home / Self Care | Payer: BC Managed Care – PPO | Source: Ambulatory Visit | Attending: Internal Medicine | Admitting: Internal Medicine

## 2013-02-07 ENCOUNTER — Ambulatory Visit: Admission: RE | Admit: 2013-02-07 | Payer: BC Managed Care – PPO | Source: Ambulatory Visit

## 2013-02-07 DIAGNOSIS — Z1231 Encounter for screening mammogram for malignant neoplasm of breast: Secondary | ICD-10-CM

## 2013-02-08 ENCOUNTER — Encounter: Payer: Self-pay | Admitting: Internal Medicine

## 2013-02-09 ENCOUNTER — Ambulatory Visit: Payer: BC Managed Care – PPO | Admitting: Physical Therapy

## 2013-02-13 ENCOUNTER — Ambulatory Visit: Payer: BC Managed Care – PPO | Admitting: Physical Therapy

## 2013-02-14 ENCOUNTER — Ambulatory Visit: Payer: BC Managed Care – PPO | Admitting: Physical Therapy

## 2013-02-15 ENCOUNTER — Other Ambulatory Visit: Payer: Self-pay | Admitting: Internal Medicine

## 2013-02-17 ENCOUNTER — Telehealth: Payer: Self-pay

## 2013-02-17 MED ORDER — MICROLET LANCETS MISC: Status: DC

## 2013-02-17 MED ORDER — GLUCOSE BLOOD VI STRP: ORAL_STRIP | Status: DC

## 2013-02-17 NOTE — Telephone Encounter (Signed)
Received request for the contour next test strips and microlet lancets, testing once daily, so her insurance will pay.

## 2013-02-20 ENCOUNTER — Ambulatory Visit: Payer: BC Managed Care – PPO | Admitting: Physical Therapy

## 2013-02-21 ENCOUNTER — Ambulatory Visit: Payer: BC Managed Care – PPO | Admitting: Physical Therapy

## 2013-02-27 ENCOUNTER — Ambulatory Visit: Payer: BC Managed Care – PPO | Admitting: Physical Therapy

## 2013-02-27 ENCOUNTER — Telehealth: Payer: Self-pay | Admitting: *Deleted

## 2013-02-27 MED ORDER — HYDROCODONE-ACETAMINOPHEN 10-325 MG PO TABS: 1.0000 | ORAL_TABLET | Freq: Three times a day (TID) | ORAL | Status: DC

## 2013-02-27 NOTE — Telephone Encounter (Signed)
Ok if due for refill - see med list for last refill or check w/ Georgiann Hahn

## 2013-02-27 NOTE — Telephone Encounter (Signed)
Pt called requesting Hydrocodone refill.  Please advise 

## 2013-02-27 NOTE — Telephone Encounter (Signed)
Spoke with pt, Rx ready  

## 2013-03-08 ENCOUNTER — Ambulatory Visit: Payer: BC Managed Care – PPO | Admitting: Physical Therapy

## 2013-03-13 ENCOUNTER — Ambulatory Visit: Payer: BC Managed Care – PPO | Attending: Internal Medicine | Admitting: Physical Therapy

## 2013-03-13 DIAGNOSIS — M545 Low back pain, unspecified: Secondary | ICD-10-CM | POA: Insufficient documentation

## 2013-03-13 DIAGNOSIS — R5381 Other malaise: Secondary | ICD-10-CM | POA: Insufficient documentation

## 2013-03-13 DIAGNOSIS — IMO0001 Reserved for inherently not codable concepts without codable children: Secondary | ICD-10-CM | POA: Insufficient documentation

## 2013-03-14 ENCOUNTER — Encounter: Payer: BC Managed Care – PPO | Admitting: Physical Therapy

## 2013-03-20 ENCOUNTER — Ambulatory Visit: Payer: BC Managed Care – PPO | Admitting: Physical Therapy

## 2013-03-22 ENCOUNTER — Ambulatory Visit: Payer: BC Managed Care – PPO | Admitting: Physical Therapy

## 2013-03-27 ENCOUNTER — Ambulatory Visit: Payer: BC Managed Care – PPO

## 2013-03-29 ENCOUNTER — Ambulatory Visit: Payer: BC Managed Care – PPO

## 2013-03-30 ENCOUNTER — Telehealth: Payer: Self-pay | Admitting: *Deleted

## 2013-03-30 NOTE — Telephone Encounter (Signed)
Called patient: she has been taking her hydrocodone w/o relief; ibuprofen 800 mg q8 w/o relief  Plan No stronger pain meds can be called in.  For tonight continue her current medications   Apply ice pack to face as needed  Pick up in AM Rx for dilaudid 1 mg q 4 prn #10

## 2013-03-30 NOTE — Telephone Encounter (Signed)
Pt called states she had a tooth extraction today, states the Hydrocodone is not effective.  Pt further states she is unable to receive pain medications from others due to pain contract with Dr Linda Hedges.  Please advise

## 2013-03-31 ENCOUNTER — Telehealth: Payer: Self-pay

## 2013-03-31 MED ORDER — HYDROCODONE-ACETAMINOPHEN 10-325 MG PO TABS: 1.0000 | ORAL_TABLET | Freq: Three times a day (TID) | ORAL | Status: DC

## 2013-03-31 MED ORDER — HYDROMORPHONE HCL 2 MG PO TABS: 2.0000 mg | ORAL_TABLET | ORAL | Status: DC | PRN

## 2013-03-31 NOTE — Telephone Encounter (Signed)
SCRIPTS PRINTED WAITING SIGNATURE WILL CALL HUSBAND WHEN SIGNED

## 2013-03-31 NOTE — Telephone Encounter (Signed)
Message copied by Shelly Coss on Fri Mar 31, 2013  8:26 AM ------      Message from: Adella Hare E      Created: Thu Mar 30, 2013  6:23 PM       Please prepare 3 refill Rx Hydrocodone/APAP for her or her husband to pick up tomorrow.,            May also have dilaudid 1 mg #10 , sig 1 po q 4 severe pain.            Thanks ------

## 2013-05-01 ENCOUNTER — Ambulatory Visit: Payer: BC Managed Care – PPO | Admitting: Internal Medicine

## 2013-05-02 ENCOUNTER — Encounter: Payer: Self-pay | Admitting: Internal Medicine

## 2013-05-02 ENCOUNTER — Ambulatory Visit (INDEPENDENT_AMBULATORY_CARE_PROVIDER_SITE_OTHER)
Admission: RE | Admit: 2013-05-02 | Discharge: 2013-05-02 | Disposition: A | Discharge status: Home / Self Care | Payer: BC Managed Care – PPO | Source: Ambulatory Visit | Attending: Internal Medicine | Admitting: Internal Medicine

## 2013-05-02 ENCOUNTER — Other Ambulatory Visit: Payer: BC Managed Care – PPO

## 2013-05-02 ENCOUNTER — Ambulatory Visit (INDEPENDENT_AMBULATORY_CARE_PROVIDER_SITE_OTHER): Payer: BC Managed Care – PPO | Admitting: Internal Medicine

## 2013-05-02 ENCOUNTER — Other Ambulatory Visit: Payer: Self-pay | Admitting: *Deleted

## 2013-05-02 ENCOUNTER — Ambulatory Visit: Payer: BC Managed Care – PPO | Admitting: Internal Medicine

## 2013-05-02 VITALS — BP 118/90 | HR 101 | Temp 97.0°F | Wt 177.8 lb

## 2013-05-02 DIAGNOSIS — IMO0001 Reserved for inherently not codable concepts without codable children: Secondary | ICD-10-CM

## 2013-05-02 DIAGNOSIS — M25559 Pain in unspecified hip: Secondary | ICD-10-CM

## 2013-05-02 DIAGNOSIS — E1165 Type 2 diabetes mellitus with hyperglycemia: Secondary | ICD-10-CM

## 2013-05-02 DIAGNOSIS — E785 Hyperlipidemia, unspecified: Secondary | ICD-10-CM

## 2013-05-02 DIAGNOSIS — I10 Essential (primary) hypertension: Secondary | ICD-10-CM

## 2013-05-02 DIAGNOSIS — M25551 Pain in right hip: Secondary | ICD-10-CM

## 2013-05-02 DIAGNOSIS — M797 Fibromyalgia: Secondary | ICD-10-CM

## 2013-05-02 DIAGNOSIS — M5430 Sciatica, unspecified side: Secondary | ICD-10-CM

## 2013-05-02 DIAGNOSIS — F341 Dysthymic disorder: Secondary | ICD-10-CM

## 2013-05-02 LAB — MICROALBUMIN / CREATININE URINE RATIO
Creatinine,U: 51.5 mg/dL
Microalb Creat Ratio: 0.2 mg/g (ref 0.0–30.0)
Microalb, Ur: 0.1 mg/dL (ref 0.0–1.9)

## 2013-05-02 LAB — BASIC METABOLIC PANEL
BUN: 11 mg/dL (ref 6–23)
CO2: 24 mEq/L (ref 19–32)
Calcium: 10.2 mg/dL (ref 8.4–10.5)
Chloride: 106 mEq/L (ref 96–112)
Creatinine, Ser: 1 mg/dL (ref 0.4–1.2)
GFR: 57.75 mL/min — ABNORMAL LOW (ref >60.00)
Glucose, Bld: 104 mg/dL — ABNORMAL HIGH (ref 70–99)
Potassium: 4.4 mEq/L (ref 3.5–5.1)
Sodium: 139 mEq/L (ref 135–145)

## 2013-05-02 NOTE — Assessment & Plan Note (Signed)
Taking lyrica for the fibromyalgia pain, prescribed by psychiatrist. Is not helping.   Plan Follow up with psychiatrist. Plan is to discontinue the lyrica.

## 2013-05-02 NOTE — Patient Instructions (Signed)
It has been a pleasure providing medical care for you today.  1) Hip Pain - We will get an X-Ray of your hips today to see if there are any changes from your 2012 x ray. - If there is no finding on this test, we will get an MRI  2) Diabetes - Control your sugar intake.  - We will check your urine protein and serum creatinine today to see if we need to start a new medication to protect your kidneys.
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
## 2013-05-02 NOTE — Assessment & Plan Note (Addendum)
Patient reports several weeks of pain in current episode. Pain on palpation to greater trochanter.  Plan Bilateral hip and pelvic films.   Addendum - loss of joint space right hip joint.  Plan Refer to Dr. Tamala Julian for intraarticular injection.

## 2013-05-02 NOTE — Assessment & Plan Note (Addendum)
Patient presents today c/o sciatica right.  Says the pain is sharp when trying to walk. Exam suggest hip as origin of pain with a negative back exam.  Plan Plain films of hips bilateral today.

## 2013-05-02 NOTE — Assessment & Plan Note (Addendum)
BP Readings from Last 3 Encounters:  05/02/13 118/90  01/24/13 150/90  01/12/13 150/86    BP moderately well controlled, but patient has diabetes.  Plan Urine microalbumin / creatinine  Start ACE for renal protection pending results of above test.

## 2013-05-02 NOTE — Assessment & Plan Note (Signed)
Describes mood as good now.   Plan Follow with psych as directed.

## 2013-05-02 NOTE — Progress Notes (Signed)
Pre visit review using our clinic review tool, if applicable. No additional management support is needed unless otherwise documented below in the visit note. 

## 2013-05-02 NOTE — Assessment & Plan Note (Addendum)
Lab Results  Component Value Date   HGBA1C 6.5 01/23/2013   Well controlled on current medications.  Plan Urine albumin / creatinine  Pending results, start ACE for renal protection.

## 2013-05-02 NOTE — Assessment & Plan Note (Addendum)
Lipid Panel     Component Value Date/Time   CHOL 140 01/23/2013 1327   TRIG 255.0* 01/23/2013 1327   HDL 51.60 01/23/2013 1327   CHOLHDL 3 01/23/2013 1327   VLDL 51.0* 01/23/2013 1327        LDL                        13 Well controlled with current medications  Plan: Continue crestor.

## 2013-05-09 ENCOUNTER — Telehealth: Payer: Self-pay | Admitting: *Deleted

## 2013-05-09 DIAGNOSIS — M1611 Unilateral primary osteoarthritis, right hip: Secondary | ICD-10-CM

## 2013-05-09 NOTE — Telephone Encounter (Signed)
Dr. Tamala Julian is overbooked for tomorrow, out of the office until next Wednesday and has no openings until 05/22/13.  Consulted with Dr. Linda Hedges, who stated there was nothing more he could do for patient (has prescribed all the pain meds he can), but that if patient was not able to wait until 05/22/13, he would refer her to [facility].  Phoned patient, no answer, left detailed voicemail message with above info, including, per MD, if tomorrow's appt with PCP is for hip pain, to call back and cancel appt.

## 2013-05-09 NOTE — Telephone Encounter (Signed)
Patient phoned inquiring about the status of an "injection" d/w PCP to be performed by Hulan Saas.  States she is still in a lot of pain in her hip.  Please advise.  CB# [phone]

## 2013-05-09 NOTE — Telephone Encounter (Signed)
Will refer to dr Tamala Julian. Cancel tomorrows appt with me if it is for the same problem

## 2013-05-10 ENCOUNTER — Ambulatory Visit: Payer: BC Managed Care – PPO | Admitting: Internal Medicine

## 2013-05-10 ENCOUNTER — Telehealth: Payer: Self-pay | Admitting: *Deleted

## 2013-05-10 DIAGNOSIS — M1611 Unilateral primary osteoarthritis, right hip: Secondary | ICD-10-CM

## 2013-05-10 NOTE — Telephone Encounter (Signed)
Patient returned call, re-iterated message from PCP yesterday.  Order placed for ortho referral.  Raliegh Ip will see pt tomorrow morning at 0900.

## 2013-05-23 ENCOUNTER — Ambulatory Visit: Payer: BC Managed Care – PPO | Admitting: Family Medicine

## 2013-05-29 ENCOUNTER — Other Ambulatory Visit: Payer: Self-pay | Admitting: Orthopedic Surgery

## 2013-05-30 ENCOUNTER — Other Ambulatory Visit: Payer: Self-pay | Admitting: Orthopedic Surgery

## 2013-06-01 ENCOUNTER — Ambulatory Visit (HOSPITAL_COMMUNITY)
Admission: RE | Admit: 2013-06-01 | Discharge: 2013-06-01 | Disposition: A | Discharge status: Home / Self Care | Payer: BC Managed Care – PPO | Source: Ambulatory Visit | Attending: Orthopedic Surgery | Admitting: Orthopedic Surgery

## 2013-06-01 ENCOUNTER — Emergency Department (HOSPITAL_COMMUNITY)
Admission: EM | Admit: 2013-06-01 | Discharge: 2013-06-01 | Disposition: A | Discharge status: Home / Self Care | Payer: BC Managed Care – PPO | Attending: Emergency Medicine | Admitting: Emergency Medicine

## 2013-06-01 ENCOUNTER — Encounter (HOSPITAL_COMMUNITY): Payer: Self-pay

## 2013-06-01 ENCOUNTER — Encounter (HOSPITAL_COMMUNITY)
Admission: RE | Admit: 2013-06-01 | Discharge: 2013-06-01 | Disposition: A | Discharge status: Home / Self Care | Payer: BC Managed Care – PPO | Source: Ambulatory Visit | Attending: Orthopedic Surgery | Admitting: Orthopedic Surgery

## 2013-06-01 ENCOUNTER — Emergency Department (HOSPITAL_COMMUNITY): Payer: BC Managed Care – PPO

## 2013-06-01 ENCOUNTER — Encounter (HOSPITAL_COMMUNITY): Payer: Self-pay | Admitting: Emergency Medicine

## 2013-06-01 DIAGNOSIS — Z79899 Other long term (current) drug therapy: Secondary | ICD-10-CM | POA: Insufficient documentation

## 2013-06-01 DIAGNOSIS — F319 Bipolar disorder, unspecified: Secondary | ICD-10-CM | POA: Insufficient documentation

## 2013-06-01 DIAGNOSIS — Z8709 Personal history of other diseases of the respiratory system: Secondary | ICD-10-CM | POA: Insufficient documentation

## 2013-06-01 DIAGNOSIS — S0993XA Unspecified injury of face, initial encounter: Secondary | ICD-10-CM | POA: Insufficient documentation

## 2013-06-01 DIAGNOSIS — H9190 Unspecified hearing loss, unspecified ear: Secondary | ICD-10-CM | POA: Insufficient documentation

## 2013-06-01 DIAGNOSIS — Z01810 Encounter for preprocedural cardiovascular examination: Secondary | ICD-10-CM | POA: Insufficient documentation

## 2013-06-01 DIAGNOSIS — M549 Dorsalgia, unspecified: Secondary | ICD-10-CM

## 2013-06-01 DIAGNOSIS — G8929 Other chronic pain: Secondary | ICD-10-CM | POA: Insufficient documentation

## 2013-06-01 DIAGNOSIS — E785 Hyperlipidemia, unspecified: Secondary | ICD-10-CM | POA: Insufficient documentation

## 2013-06-01 DIAGNOSIS — Z8719 Personal history of other diseases of the digestive system: Secondary | ICD-10-CM | POA: Insufficient documentation

## 2013-06-01 DIAGNOSIS — E119 Type 2 diabetes mellitus without complications: Secondary | ICD-10-CM | POA: Insufficient documentation

## 2013-06-01 DIAGNOSIS — F411 Generalized anxiety disorder: Secondary | ICD-10-CM | POA: Insufficient documentation

## 2013-06-01 DIAGNOSIS — M47812 Spondylosis without myelopathy or radiculopathy, cervical region: Secondary | ICD-10-CM | POA: Insufficient documentation

## 2013-06-01 DIAGNOSIS — M169 Osteoarthritis of hip, unspecified: Secondary | ICD-10-CM | POA: Insufficient documentation

## 2013-06-01 DIAGNOSIS — Z01812 Encounter for preprocedural laboratory examination: Secondary | ICD-10-CM | POA: Insufficient documentation

## 2013-06-01 DIAGNOSIS — Y92410 Unspecified street and highway as the place of occurrence of the external cause: Secondary | ICD-10-CM | POA: Insufficient documentation

## 2013-06-01 DIAGNOSIS — M1610 Unilateral primary osteoarthritis, unspecified hip: Secondary | ICD-10-CM | POA: Insufficient documentation

## 2013-06-01 DIAGNOSIS — S199XXA Unspecified injury of neck, initial encounter: Secondary | ICD-10-CM

## 2013-06-01 DIAGNOSIS — M19049 Primary osteoarthritis, unspecified hand: Secondary | ICD-10-CM | POA: Insufficient documentation

## 2013-06-01 DIAGNOSIS — IMO0002 Reserved for concepts with insufficient information to code with codable children: Secondary | ICD-10-CM | POA: Insufficient documentation

## 2013-06-01 DIAGNOSIS — Y9389 Activity, other specified: Secondary | ICD-10-CM | POA: Insufficient documentation

## 2013-06-01 DIAGNOSIS — Z8701 Personal history of pneumonia (recurrent): Secondary | ICD-10-CM | POA: Insufficient documentation

## 2013-06-01 DIAGNOSIS — Z01818 Encounter for other preprocedural examination: Secondary | ICD-10-CM | POA: Insufficient documentation

## 2013-06-01 HISTORY — DX: Frequency of micturition: R35.0

## 2013-06-01 HISTORY — DX: Restless legs syndrome: G25.81

## 2013-06-01 HISTORY — DX: Anesthesia of skin: R20.0

## 2013-06-01 HISTORY — DX: Fibromyalgia: M79.7

## 2013-06-01 HISTORY — DX: Unspecified osteoarthritis, unspecified site: M19.90

## 2013-06-01 HISTORY — DX: Unspecified urinary incontinence: R32

## 2013-06-01 HISTORY — DX: Other chronic pain: G89.29

## 2013-06-01 HISTORY — DX: Diverticulosis of intestine, part unspecified, without perforation or abscess without bleeding: K57.90

## 2013-06-01 HISTORY — DX: Irritable bowel syndrome, unspecified: K58.9

## 2013-06-01 HISTORY — DX: Disorientation, unspecified: R41.0

## 2013-06-01 HISTORY — DX: Pneumonia, unspecified organism: J18.9

## 2013-06-01 HISTORY — DX: Constipation, unspecified: K59.00

## 2013-06-01 HISTORY — DX: Pain in unspecified joint: M25.50

## 2013-06-01 HISTORY — DX: Urgency of urination: R39.15

## 2013-06-01 HISTORY — DX: Effusion, unspecified joint: M25.40

## 2013-06-01 HISTORY — DX: Dorsalgia, unspecified: M54.9

## 2013-06-01 LAB — SURGICAL PCR SCREEN
MRSA, PCR: NEGATIVE
Staphylococcus aureus: NEGATIVE

## 2013-06-01 LAB — BASIC METABOLIC PANEL
BUN: 15 mg/dL (ref 6–23)
CO2: 22 mEq/L (ref 19–32)
Calcium: 9.7 mg/dL (ref 8.4–10.5)
Chloride: 104 mEq/L (ref 96–112)
Creatinine, Ser: 0.88 mg/dL (ref 0.50–1.10)
GFR calc Af Amer: 78 mL/min — ABNORMAL LOW (ref >90)
GFR calc non Af Amer: 67 mL/min — ABNORMAL LOW (ref >90)
Glucose, Bld: 150 mg/dL — ABNORMAL HIGH (ref 70–99)
Potassium: 4.5 mEq/L (ref 3.7–5.3)
Sodium: 140 mEq/L (ref 137–147)

## 2013-06-01 LAB — URINALYSIS, ROUTINE W REFLEX MICROSCOPIC
Bilirubin Urine: NEGATIVE
Glucose, UA: NEGATIVE mg/dL
Hgb urine dipstick: NEGATIVE
Ketones, ur: NEGATIVE mg/dL
Nitrite: NEGATIVE
Protein, ur: NEGATIVE mg/dL
Specific Gravity, Urine: 1.016 (ref 1.005–1.030)
Urobilinogen, UA: 1 mg/dL (ref 0.0–1.0)
pH: 6.5 (ref 5.0–8.0)

## 2013-06-01 LAB — TYPE AND SCREEN
ABO/RH(D): O POS
Antibody Screen: NEGATIVE

## 2013-06-01 LAB — CBC WITH DIFFERENTIAL/PLATELET
Basophils Absolute: 0 10*3/uL (ref 0.0–0.1)
Basophils Relative: 0 % (ref 0–1)
Eosinophils Absolute: 0.2 10*3/uL (ref 0.0–0.7)
Eosinophils Relative: 2 % (ref 0–5)
HCT: 43 % (ref 36.0–46.0)
Hemoglobin: 14.2 g/dL (ref 12.0–15.0)
Lymphocytes Relative: 16 % (ref 12–46)
Lymphs Abs: 1.7 10*3/uL (ref 0.7–4.0)
MCH: 29.9 pg (ref 26.0–34.0)
MCHC: 33 g/dL (ref 30.0–36.0)
MCV: 90.5 fL (ref 78.0–100.0)
Monocytes Absolute: 0.5 10*3/uL (ref 0.1–1.0)
Monocytes Relative: 5 % (ref 3–12)
Neutro Abs: 8 10*3/uL — ABNORMAL HIGH (ref 1.7–7.7)
Neutrophils Relative %: 77 % (ref 43–77)
Platelets: 204 10*3/uL (ref 150–400)
RBC: 4.75 MIL/uL (ref 3.87–5.11)
RDW: 13.3 % (ref 11.5–15.5)
WBC: 10.4 10*3/uL (ref 4.0–10.5)

## 2013-06-01 LAB — PROTIME-INR
INR: 0.95 (ref 0.00–1.49)
Prothrombin Time: 12.5 seconds (ref 11.6–15.2)

## 2013-06-01 LAB — ABO/RH: ABO/RH(D): O POS

## 2013-06-01 LAB — URINE MICROSCOPIC-ADD ON

## 2013-06-01 LAB — APTT: aPTT: 28 seconds (ref 24–37)

## 2013-06-01 MED ORDER — HYDROCODONE-ACETAMINOPHEN 5-325 MG PO TABS: 2.0000 | ORAL_TABLET | ORAL | Status: DC | PRN

## 2013-06-01 MED ORDER — HYDROCODONE-ACETAMINOPHEN 5-325 MG PO TABS
2.0000 | ORAL_TABLET | Freq: Once | ORAL | Status: AC
  Administered 2013-06-01: 2 via ORAL
  Filled 2013-06-01: qty 2

## 2013-06-01 MED ORDER — CHLORHEXIDINE GLUCONATE 4 % EX LIQD: 60.0000 mL | Freq: Once | CUTANEOUS | Status: DC
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
## 2013-06-01 NOTE — Discharge Instructions (Signed)
Take Vicodin as needed for pain. Refer to attached documents for more information.  °

## 2013-06-01 NOTE — Pre-Procedure Instructions (Signed)
[name] [name]  06/01/2013   Your procedure is scheduled on:  Fri, April 3 @ 7:30 AM  Report to [facility]  at 5:30 AM.  Call this number if you have problems the morning of surgery: [phone]   Remember:   Do not eat food or drink liquids after midnight.   Take these medicines the morning of surgery with A SIP OF WATER: Ativan(Lorazepam),Pain Pill(if needed),Synthroid(Levothyroxine),and Lyrica(Pregabalin)               Stop taking yourr Lovaza. No Goody's,BC's,Aleve,Ibuprofen,Aspirin,or any Herbal Medications   Do not wear jewelry, make-up or nail polish.  Do not wear lotions, powders, or perfumes. You may wear deodorant.  Do not shave 48 hours prior to surgery.   Do not bring valuables to the hospital.  [facility] is not responsible                  for any belongings or valuables.               Contacts, dentures or bridgework may not be worn into surgery.  Leave suitcase in the car. After surgery it may be brought to your room.  For patients admitted to the hospital, discharge time is determined by your                treatment team.                 Special Instructions:  [facility] - Preparing for Surgery  Before surgery, you can play an important role.  Because skin is not sterile, your skin needs to be as free of germs as possible.  You can reduce the number of germs on you skin by washing with CHG (chlorahexidine gluconate) soap before surgery.  CHG is an antiseptic cleaner which kills germs and bonds with the skin to continue killing germs even after washing.  Please DO NOT use if you have an allergy to CHG or antibacterial soaps.  If your skin becomes reddened/irritated stop using the CHG and inform your nurse when you arrive at Short Stay.  Do not shave (including legs and underarms) for at least 48 hours prior to the first CHG shower.  You may shave your face.  Please follow these instructions carefully:   1.  Shower with CHG Soap the night before  surgery and the                                morning of Surgery.  2.  If you choose to wash your hair, wash your hair first as usual with your       normal shampoo.  3.  After you shampoo, rinse your hair and body thoroughly to remove the                      Shampoo.  4.  Use CHG as you would any other liquid soap.  You can apply chg directly       to the skin and wash gently with scrungie or a clean washcloth.  5.  Apply the CHG Soap to your body ONLY FROM THE NECK DOWN.        Do not use on open wounds or open sores.  Avoid contact with your eyes,       ears, mouth and genitals (private parts).  Wash genitals (private parts)  with your normal soap.  6.  Wash thoroughly, paying special attention to the area where your surgery        will be performed.  7.  Thoroughly rinse your body with warm water from the neck down.  8.  DO NOT shower/wash with your normal soap after using and rinsing off       the CHG Soap.  9.  Pat yourself dry with a clean towel.            10.  Wear clean pajamas.            11.  Place clean sheets on your bed the night of your first shower and do not        sleep with pets.  Day of Surgery  Do not apply any lotions/deoderants the morning of surgery.  Please wear clean clothes to the hospital/surgery center.     Please read over the following fact sheets that you were given: Pain Booklet, Coughing and Deep Breathing, Blood Transfusion Information, MRSA Information and Surgical Site Infection Prevention

## 2013-06-01 NOTE — Progress Notes (Addendum)
Pt doesn't have a cardiologist  Stress test and echo done > 60yrs ago-unsure where it was done   Denies ever having a heart cath  Denies ekg or cxr in past yr  Medical Md is Dr.Michael Norins

## 2013-06-02 ENCOUNTER — Other Ambulatory Visit: Payer: Self-pay | Admitting: Orthopedic Surgery
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
## 2013-06-05 ENCOUNTER — Other Ambulatory Visit (HOSPITAL_COMMUNITY): Payer: BC Managed Care – PPO

## 2013-06-05 NOTE — ED Provider Notes (Signed)
Medical screening examination/treatment/procedure(s) were performed by non-physician practitioner and as supervising physician I was immediately available for consultation/collaboration.   EKG Interpretation None       [name], MD 06/05/13 1537

## 2013-06-08 DIAGNOSIS — M1611 Unilateral primary osteoarthritis, right hip: Secondary | ICD-10-CM | POA: Diagnosis present

## 2013-06-08 MED ORDER — DEXTROSE-NACL 5-0.45 % IV SOLN: INTRAVENOUS | Status: DC

## 2013-06-08 MED ORDER — CEFAZOLIN SODIUM-DEXTROSE 2-3 GM-% IV SOLR
2.0000 g | INTRAVENOUS | Status: AC
  Administered 2013-06-09: 2 g via INTRAVENOUS
  Filled 2013-06-08: qty 50

## 2013-06-08 NOTE — H&P (Signed)
TOTAL HIP ADMISSION H&P  Patient is admitted for right total hip arthroplasty.  Subjective:  Chief Complaint: right hip pain  HPI: [name] [name], 68 y.o. female, has a history of pain and functional disability in the right hip(s) due to arthritis and patient has failed non-surgical conservative treatments for greater than 12 weeks to include NSAID's and/or analgesics, corticosteriod injections, flexibility and strengthening excercises and use of assistive devices.  Onset of symptoms was gradual starting several years ago with gradually worsening course since that time.The patient noted no past surgery on the right hip(s).  Patient currently rates pain in the right hip at 10 out of 10 with activity. Patient has night pain, worsening of pain with activity and weight bearing, trendelenberg gait, pain that interfers with activities of daily living and pain with passive range of motion. Patient has evidence of joint subluxation and joint space narrowing by imaging studies. This condition presents safety issues increasing the risk of falls.   There is no current active infection.  Patient Active Problem List   Diagnosis Date Noted  . Hip pain, right 05/02/2013  . IBS (irritable bowel syndrome) 01/02/2013  . Unspecified vitamin D deficiency 07/05/2012  . Pure hyperglyceridemia 07/04/2012  . Type II or unspecified type diabetes mellitus without mention of complication, uncontrolled 06/18/2012  . Hypertension 06/18/2012  . Bipolar 1 disorder 06/18/2012  . Low back pain 02/25/2011  . Acute ischemic colitis 01/06/2011  . Sciatica 12/19/2010  . Fibromyalgia 10/28/2010  . Primary osteoarthritis of right hip 10/24/2010  . Hyperlipidemia LDL goal < 100 05/05/2010  . DEPRESSION/ANXIETY 05/05/2010  . ABUSE, ALCOHOL, IN REMISSION 05/05/2010  . OTITIS MEDIA, CHRONIC 05/05/2010  . HEARING LOSS, BILATERAL 05/05/2010  . ALLERGIC RHINITIS, SEASONAL, MILD 05/05/2010  . URINARY INCONTINENCE, STRESS, FEMALE  05/05/2010  . OSTEOARTHRITIS, HANDS, BILATERAL 05/05/2010  . OSTEOARTHRITIS, CERVICAL SPINE 05/05/2010   Past Medical History  Diagnosis Date  . Bilateral hearing loss   . Urinary, incontinence, stress female   . Dyslipidemia     takes Crestor daily  . Osteoarthritis cervical spine   . Osteoarthritis of hand     bilateral  . Colitis, ischemic 2012  . Sciatica   . Alcohol abuse   . Hepatic steatosis 06/18/12    severe  . Anxiety     takes Valium daily as needed and Ativan daily  . Diabetes mellitus without complication     takes Janumet daily  . Walking pneumonia     last time more than 52yrs ago  . History of bronchitis     last time at least 60yrs ago  . Confusion     r/t meds  . Numbness     in right foot  . Fibromyalgia   . Lupus     tumid-skin  . Joint pain   . Joint swelling   . Chronic back pain     DDD  . Osteoarthritis     in hips  . Constipation   . IBS (irritable bowel syndrome)   . IBS (irritable bowel syndrome)   . Diverticulosis   . Depression     takes Prozac daily and Bupspirone   . Restless leg syndrome     takes Requip nightly  . Urinary frequency   . Urinary urgency   . Urinary leakage   . Bipolar 1 disorder     takes Lithium nightly and Synthroid daily    Past Surgical History  Procedure Laterality Date  . D&c x 4    .  Abdominal hysterectomy    . Tonsillectomy    . Excision of tumors at right neck      angle of jaw '68, benign  . Flexible sigmoidoscopy N/A 06/21/2012    Procedure: FLEXIBLE SIGMOIDOSCOPY;  Surgeon: Jerene Bears, MD;  Location: [facility];  Service: Gastroenterology;  Laterality: N/A;  . Appendectomy    . Tubal ligation    . Colonoscopy    . Esophagogastroduodenoscopy      No prescriptions prior to admission   Allergies  Allergen Reactions  . Azithromycin Nausea And Vomiting    All mycins  . Erythromycin Nausea And Vomiting  . Neurontin [Gabapentin] Other (See Comments)    unknown  . Sulfur Other (See  Comments)    unknown  . Codeine Anxiety and Other (See Comments)    hallucinations  . Procaine Hcl Palpitations    History  Substance Use Topics  . Smoking status: Never Smoker   . Smokeless tobacco: Never Used  . Alcohol Use: No    Family History  Problem Relation Age of Onset  . Diabetes Brother   . Alcohol abuse Brother     x 2  . Drug abuse Mother   . Alcohol abuse Mother   . Alcohol abuse Father   . Colon cancer Paternal Uncle      Review of Systems  Constitutional: Negative.   HENT: Negative.   Eyes: Negative.   Respiratory: Negative.   Cardiovascular: Negative.   Gastrointestinal: Negative.   Genitourinary: Negative.   Musculoskeletal: Positive for joint pain.  Skin: Negative.   Neurological: Negative.   Endo/Heme/Allergies: Negative.   Psychiatric/Behavioral:       Bipolar disorder    Objective:  Physical Exam  Constitutional: She is oriented to person, place, and time. She appears well-developed and well-nourished.  HENT:  Head: Normocephalic and atraumatic.  Eyes: Pupils are equal, round, and reactive to light.  Neck: Normal range of motion. Neck supple.  Cardiovascular: Intact distal pulses.   Respiratory: Effort normal.  Musculoskeletal:  Patient walks with a profound right sided antalgic Trendelenburg gait when using a cane.  In the seated position, internal rotation the hip causes significant pain that she says also radiates down of the medial aspect of the knee which has a full range of motion on the right side.  Foot tap is negative.  Skin over her hip is intact no erythema no swelling.  Normal sensation to her feet.    Neurological: She is alert and oriented to person, place, and time.  Skin: Skin is warm and dry.  Psychiatric: She has a normal mood and affect. Her behavior is normal. Judgment and thought content normal.    Vital signs in last 24 hours:    Labs:   Estimated body mass index is 32.51 kg/(m^2) as calculated from the  following:   Height as of 12/31/12: 5\' 2"  (1.575 m).   Weight as of 05/02/13: 80.65 kg (177 lb 12.8 oz).   Imaging Review Her x-rays are reviewed showing end-stage arthritis of the right hip bone-on-bone with lateral subluxation of the femoral head about 5 mm compared to the left side  Assessment/Plan:  End stage arthritis, right hip(s)  The patient history, physical examination, clinical judgement of the provider and imaging studies are consistent with end stage degenerative joint disease of the right hip(s) and total hip arthroplasty is deemed medically necessary. The treatment options including medical management, injection therapy, arthroscopy and arthroplasty were discussed at length. The risks and benefits  of total hip arthroplasty were presented and reviewed. The risks due to aseptic loosening, infection, stiffness, dislocation/subluxation,  thromboembolic complications and other imponderables were discussed.  The patient acknowledged the explanation, agreed to proceed with the plan and consent was signed. Patient is being admitted for inpatient treatment for surgery, pain control, PT, OT, prophylactic antibiotics, VTE prophylaxis, progressive ambulation and ADL's and discharge planning.The patient is planning to be discharged to skilled nursing facility

## 2013-06-09 ENCOUNTER — Encounter (HOSPITAL_COMMUNITY): Payer: BC Managed Care – PPO | Admitting: Vascular Surgery

## 2013-06-09 ENCOUNTER — Encounter (HOSPITAL_COMMUNITY): Payer: Self-pay | Admitting: Surgery

## 2013-06-09 ENCOUNTER — Encounter (HOSPITAL_COMMUNITY)
Admission: RE | Disposition: A | Discharge status: Skilled Nursing Facility | Payer: Self-pay | Source: Ambulatory Visit | Attending: Orthopedic Surgery

## 2013-06-09 ENCOUNTER — Inpatient Hospital Stay (HOSPITAL_COMMUNITY): Payer: BC Managed Care – PPO

## 2013-06-09 ENCOUNTER — Inpatient Hospital Stay (HOSPITAL_COMMUNITY)
Admission: RE | Admit: 2013-06-09 | Discharge: 2013-06-13 | DRG: 470 | Disposition: A | Discharge status: Skilled Nursing Facility | Payer: BC Managed Care – PPO | Source: Ambulatory Visit | Attending: Orthopedic Surgery | Admitting: Orthopedic Surgery

## 2013-06-09 ENCOUNTER — Inpatient Hospital Stay (HOSPITAL_COMMUNITY): Payer: BC Managed Care – PPO | Admitting: Anesthesiology

## 2013-06-09 DIAGNOSIS — Z6379 Other stressful life events affecting family and household: Secondary | ICD-10-CM

## 2013-06-09 DIAGNOSIS — Z833 Family history of diabetes mellitus: Secondary | ICD-10-CM

## 2013-06-09 DIAGNOSIS — IMO0001 Reserved for inherently not codable concepts without codable children: Secondary | ICD-10-CM | POA: Diagnosis present

## 2013-06-09 DIAGNOSIS — I10 Essential (primary) hypertension: Secondary | ICD-10-CM | POA: Diagnosis present

## 2013-06-09 DIAGNOSIS — Z885 Allergy status to narcotic agent status: Secondary | ICD-10-CM

## 2013-06-09 DIAGNOSIS — G2581 Restless legs syndrome: Secondary | ICD-10-CM | POA: Diagnosis present

## 2013-06-09 DIAGNOSIS — E119 Type 2 diabetes mellitus without complications: Secondary | ICD-10-CM | POA: Diagnosis present

## 2013-06-09 DIAGNOSIS — M1611 Unilateral primary osteoarthritis, right hip: Secondary | ICD-10-CM

## 2013-06-09 DIAGNOSIS — Z882 Allergy status to sulfonamides status: Secondary | ICD-10-CM

## 2013-06-09 DIAGNOSIS — Z881 Allergy status to other antibiotic agents status: Secondary | ICD-10-CM

## 2013-06-09 DIAGNOSIS — M169 Osteoarthritis of hip, unspecified: Principal | ICD-10-CM | POA: Diagnosis present

## 2013-06-09 DIAGNOSIS — M19049 Primary osteoarthritis, unspecified hand: Secondary | ICD-10-CM | POA: Diagnosis present

## 2013-06-09 DIAGNOSIS — Z800 Family history of malignant neoplasm of digestive organs: Secondary | ICD-10-CM

## 2013-06-09 DIAGNOSIS — M1612 Unilateral primary osteoarthritis, left hip: Secondary | ICD-10-CM | POA: Diagnosis present

## 2013-06-09 DIAGNOSIS — Z888 Allergy status to other drugs, medicaments and biological substances status: Secondary | ICD-10-CM

## 2013-06-09 DIAGNOSIS — F319 Bipolar disorder, unspecified: Secondary | ICD-10-CM | POA: Diagnosis present

## 2013-06-09 DIAGNOSIS — F411 Generalized anxiety disorder: Secondary | ICD-10-CM | POA: Diagnosis present

## 2013-06-09 DIAGNOSIS — M1610 Unilateral primary osteoarthritis, unspecified hip: Principal | ICD-10-CM | POA: Diagnosis present

## 2013-06-09 DIAGNOSIS — F1011 Alcohol abuse, in remission: Secondary | ICD-10-CM | POA: Diagnosis present

## 2013-06-09 DIAGNOSIS — E781 Pure hyperglyceridemia: Secondary | ICD-10-CM | POA: Diagnosis present

## 2013-06-09 DIAGNOSIS — K589 Irritable bowel syndrome without diarrhea: Secondary | ICD-10-CM | POA: Diagnosis present

## 2013-06-09 DIAGNOSIS — H9190 Unspecified hearing loss, unspecified ear: Secondary | ICD-10-CM | POA: Diagnosis present

## 2013-06-09 HISTORY — PX: TOTAL HIP ARTHROPLASTY: SHX124

## 2013-06-09 LAB — HEMOGLOBIN A1C
Hgb A1c MFr Bld: 6.2 % — ABNORMAL HIGH (ref <5.7)
Mean Plasma Glucose: 131 mg/dL — ABNORMAL HIGH (ref <117)

## 2013-06-09 LAB — GLUCOSE, CAPILLARY
Glucose-Capillary: 90 mg/dL (ref 70–99)
Glucose-Capillary: 126 mg/dL — ABNORMAL HIGH (ref 70–99)
Glucose-Capillary: 122 mg/dL — ABNORMAL HIGH (ref 70–99)
Glucose-Capillary: 141 mg/dL — ABNORMAL HIGH (ref 70–99)
Glucose-Capillary: 129 mg/dL — ABNORMAL HIGH (ref 70–99)

## 2013-06-09 SURGERY — ARTHROPLASTY, HIP, TOTAL,POSTERIOR APPROACH
Anesthesia: General | Site: Hip | Laterality: Right

## 2013-06-09 MED ORDER — FENTANYL CITRATE 0.05 MG/ML IJ SOLN
INTRAMUSCULAR | Status: AC
  Filled 2013-06-09: qty 5

## 2013-06-09 MED ORDER — METOCLOPRAMIDE HCL 5 MG PO TABS
5.0000 mg | ORAL_TABLET | Freq: Three times a day (TID) | ORAL | Status: DC | PRN
  Filled 2013-06-09: qty 2

## 2013-06-09 MED ORDER — BISACODYL 5 MG PO TBEC: 5.0000 mg | DELAYED_RELEASE_TABLET | Freq: Every day | ORAL | Status: DC | PRN

## 2013-06-09 MED ORDER — OXYCODONE HCL 5 MG PO TABS
5.0000 mg | ORAL_TABLET | ORAL | Status: DC | PRN
  Administered 2013-06-09 – 2013-06-10 (×6): 10 mg via ORAL
  Administered 2013-06-11: 5 mg via ORAL
  Administered 2013-06-11 (×2): 10 mg via ORAL
  Administered 2013-06-11: 5 mg via ORAL
  Administered 2013-06-11 – 2013-06-13 (×10): 10 mg via ORAL
  Filled 2013-06-09: qty 2
  Filled 2013-06-09: qty 1
  Filled 2013-06-09 (×13): qty 2
  Filled 2013-06-09: qty 1
  Filled 2013-06-09 (×5): qty 2

## 2013-06-09 MED ORDER — LORAZEPAM 1 MG PO TABS
2.0000 mg | ORAL_TABLET | Freq: Three times a day (TID) | ORAL | Status: DC
  Administered 2013-06-09 – 2013-06-13 (×14): 2 mg via ORAL
  Filled 2013-06-09 (×14): qty 2

## 2013-06-09 MED ORDER — LINAGLIPTIN 5 MG PO TABS
5.0000 mg | ORAL_TABLET | Freq: Every day | ORAL | Status: DC
  Administered 2013-06-09 – 2013-06-12 (×4): 5 mg via ORAL
  Filled 2013-06-09 (×5): qty 1

## 2013-06-09 MED ORDER — MAGNESIUM CITRATE PO SOLN: 1.0000 | Freq: Once | ORAL | Status: AC | PRN

## 2013-06-09 MED ORDER — HYDROMORPHONE HCL PF 1 MG/ML IJ SOLN
0.2500 mg | INTRAMUSCULAR | Status: DC | PRN
  Administered 2013-06-09 (×3): 0.5 mg via INTRAVENOUS

## 2013-06-09 MED ORDER — OXYCODONE HCL 5 MG/5ML PO SOLN: 5.0000 mg | Freq: Once | ORAL | Status: DC | PRN

## 2013-06-09 MED ORDER — ONDANSETRON HCL 4 MG/2ML IJ SOLN
INTRAMUSCULAR | Status: DC | PRN
  Administered 2013-06-09: 4 mg via INTRAVENOUS

## 2013-06-09 MED ORDER — BUPIVACAINE-EPINEPHRINE 0.5% -1:200000 IJ SOLN
INTRAMUSCULAR | Status: DC | PRN
  Administered 2013-06-09: 10 mL

## 2013-06-09 MED ORDER — METHOCARBAMOL 500 MG PO TABS
500.0000 mg | ORAL_TABLET | Freq: Four times a day (QID) | ORAL | Status: DC | PRN
  Administered 2013-06-09 – 2013-06-13 (×8): 500 mg via ORAL
  Filled 2013-06-09 (×7): qty 1

## 2013-06-09 MED ORDER — BUPIVACAINE-EPINEPHRINE (PF) 0.5% -1:200000 IJ SOLN
INTRAMUSCULAR | Status: AC
  Filled 2013-06-09: qty 10

## 2013-06-09 MED ORDER — ONDANSETRON HCL 4 MG PO TABS: 4.0000 mg | ORAL_TABLET | Freq: Four times a day (QID) | ORAL | Status: DC | PRN

## 2013-06-09 MED ORDER — GLYCOPYRROLATE 0.2 MG/ML IJ SOLN
INTRAMUSCULAR | Status: DC | PRN
  Administered 2013-06-09: .8 mg via INTRAVENOUS

## 2013-06-09 MED ORDER — GLYCOPYRROLATE 0.2 MG/ML IJ SOLN
INTRAMUSCULAR | Status: AC
  Filled 2013-06-09: qty 4

## 2013-06-09 MED ORDER — OXYCODONE-ACETAMINOPHEN 5-325 MG PO TABS: 1.0000 | ORAL_TABLET | ORAL | Status: DC | PRN

## 2013-06-09 MED ORDER — LEVOTHYROXINE SODIUM 75 MCG PO TABS
75.0000 ug | ORAL_TABLET | Freq: Every day | ORAL | Status: DC
  Administered 2013-06-10 – 2013-06-13 (×4): 75 ug via ORAL
  Filled 2013-06-09 (×5): qty 1

## 2013-06-09 MED ORDER — LACTATED RINGERS IV SOLN
INTRAVENOUS | Status: DC | PRN
  Administered 2013-06-09 (×2): via INTRAVENOUS

## 2013-06-09 MED ORDER — DIPHENHYDRAMINE HCL 12.5 MG/5ML PO ELIX: 12.5000 mg | ORAL_SOLUTION | ORAL | Status: DC | PRN

## 2013-06-09 MED ORDER — PROPOFOL 10 MG/ML IV BOLUS
INTRAVENOUS | Status: AC
  Filled 2013-06-09: qty 20

## 2013-06-09 MED ORDER — ROPINIROLE HCL 1 MG PO TABS: 1.0000 mg | ORAL_TABLET | Freq: Every day | ORAL | Status: DC

## 2013-06-09 MED ORDER — NEOSTIGMINE METHYLSULFATE 1 MG/ML IJ SOLN
INTRAMUSCULAR | Status: AC
  Filled 2013-06-09: qty 10

## 2013-06-09 MED ORDER — MIDAZOLAM HCL 2 MG/2ML IJ SOLN
INTRAMUSCULAR | Status: AC
  Filled 2013-06-09: qty 2

## 2013-06-09 MED ORDER — NEOSTIGMINE METHYLSULFATE 1 MG/ML IJ SOLN
INTRAMUSCULAR | Status: DC | PRN
  Administered 2013-06-09: 5 mg via INTRAVENOUS

## 2013-06-09 MED ORDER — ONDANSETRON HCL 4 MG/2ML IJ SOLN: 4.0000 mg | Freq: Once | INTRAMUSCULAR | Status: DC | PRN

## 2013-06-09 MED ORDER — MENTHOL 3 MG MT LOZG: 1.0000 | LOZENGE | OROMUCOSAL | Status: DC | PRN

## 2013-06-09 MED ORDER — PROPOFOL 10 MG/ML IV BOLUS
INTRAVENOUS | Status: DC | PRN
  Administered 2013-06-09: 200 mg via INTRAVENOUS

## 2013-06-09 MED ORDER — LIDOCAINE HCL (CARDIAC) 20 MG/ML IV SOLN
INTRAVENOUS | Status: DC | PRN
  Administered 2013-06-09: 80 mg via INTRAVENOUS

## 2013-06-09 MED ORDER — FERROUS SULFATE 325 (65 FE) MG PO TABS
325.0000 mg | ORAL_TABLET | Freq: Every day | ORAL | Status: DC
  Administered 2013-06-09 – 2013-06-12 (×4): 325 mg via ORAL
  Filled 2013-06-09 (×5): qty 1

## 2013-06-09 MED ORDER — HYDROMORPHONE HCL PF 1 MG/ML IJ SOLN
INTRAMUSCULAR | Status: AC
  Filled 2013-06-09: qty 1

## 2013-06-09 MED ORDER — ROCURONIUM BROMIDE 100 MG/10ML IV SOLN
INTRAVENOUS | Status: DC | PRN
  Administered 2013-06-09: 50 mg via INTRAVENOUS

## 2013-06-09 MED ORDER — LITHIUM CARBONATE 300 MG PO CAPS
750.0000 mg | ORAL_CAPSULE | Freq: Every day | ORAL | Status: DC
  Administered 2013-06-09 – 2013-06-12 (×4): 750 mg via ORAL
  Filled 2013-06-09 (×6): qty 1

## 2013-06-09 MED ORDER — ONDANSETRON HCL 4 MG/2ML IJ SOLN
INTRAMUSCULAR | Status: AC
  Filled 2013-06-09: qty 2

## 2013-06-09 MED ORDER — DEXTROSE 5 % IV SOLN
INTRAVENOUS | Status: DC | PRN
  Administered 2013-06-09: 08:00:00 via INTRAVENOUS

## 2013-06-09 MED ORDER — ONDANSETRON HCL 4 MG/2ML IJ SOLN: 4.0000 mg | Freq: Four times a day (QID) | INTRAMUSCULAR | Status: DC | PRN

## 2013-06-09 MED ORDER — INSULIN ASPART 100 UNIT/ML ~~LOC~~ SOLN
0.0000 [IU] | Freq: Three times a day (TID) | SUBCUTANEOUS | Status: DC
  Administered 2013-06-09 – 2013-06-10 (×3): 2 [IU] via SUBCUTANEOUS
  Administered 2013-06-10: 3 [IU] via SUBCUTANEOUS
  Administered 2013-06-11: 2 [IU] via SUBCUTANEOUS
  Administered 2013-06-11: 3 [IU] via SUBCUTANEOUS
  Administered 2013-06-11: 2 [IU] via SUBCUTANEOUS

## 2013-06-09 MED ORDER — OXYCODONE HCL 5 MG PO TABS: 5.0000 mg | ORAL_TABLET | Freq: Once | ORAL | Status: DC | PRN

## 2013-06-09 MED ORDER — SODIUM CHLORIDE 0.9 % IR SOLN
Status: DC | PRN
  Administered 2013-06-09: 1

## 2013-06-09 MED ORDER — ASPIRIN EC 325 MG PO TBEC
325.0000 mg | DELAYED_RELEASE_TABLET | Freq: Every day | ORAL | Status: DC
  Administered 2013-06-10 – 2013-06-13 (×3): 325 mg via ORAL
  Filled 2013-06-09 (×5): qty 1

## 2013-06-09 MED ORDER — SENNOSIDES-DOCUSATE SODIUM 8.6-50 MG PO TABS: 1.0000 | ORAL_TABLET | Freq: Every evening | ORAL | Status: DC | PRN

## 2013-06-09 MED ORDER — METFORMIN HCL ER 500 MG PO TB24
1000.0000 mg | ORAL_TABLET | Freq: Every day | ORAL | Status: DC
  Administered 2013-06-09 – 2013-06-12 (×4): 1000 mg via ORAL
  Filled 2013-06-09 (×5): qty 2

## 2013-06-09 MED ORDER — DOCUSATE SODIUM 100 MG PO CAPS
100.0000 mg | ORAL_CAPSULE | Freq: Two times a day (BID) | ORAL | Status: DC
  Administered 2013-06-09 – 2013-06-13 (×9): 100 mg via ORAL
  Filled 2013-06-09 (×8): qty 1

## 2013-06-09 MED ORDER — ASPIRIN EC 325 MG PO TBEC: 325.0000 mg | DELAYED_RELEASE_TABLET | Freq: Two times a day (BID) | ORAL | Status: DC

## 2013-06-09 MED ORDER — FENTANYL CITRATE 0.05 MG/ML IJ SOLN
INTRAMUSCULAR | Status: DC | PRN
  Administered 2013-06-09: 100 ug via INTRAVENOUS
  Administered 2013-06-09 (×3): 50 ug via INTRAVENOUS

## 2013-06-09 MED ORDER — METHOCARBAMOL 100 MG/ML IJ SOLN: 500.0000 mg | Freq: Four times a day (QID) | INTRAVENOUS | Status: DC | PRN

## 2013-06-09 MED ORDER — METHOCARBAMOL 500 MG PO TABS
ORAL_TABLET | ORAL | Status: AC
  Filled 2013-06-09: qty 1

## 2013-06-09 MED ORDER — SITAGLIPTIN-METFORMIN HCL ER 100-1000 MG PO TB24
1.0000 | ORAL_TABLET | Freq: Every day | ORAL | Status: DC
Start: 2013-06-09 — End: 2013-06-09

## 2013-06-09 MED ORDER — OXYCODONE HCL 5 MG PO TABS
ORAL_TABLET | ORAL | Status: AC
  Filled 2013-06-09: qty 1

## 2013-06-09 MED ORDER — MIDAZOLAM HCL 5 MG/5ML IJ SOLN
INTRAMUSCULAR | Status: DC | PRN
  Administered 2013-06-09 (×2): 1 mg via INTRAVENOUS

## 2013-06-09 MED ORDER — PHENOL 1.4 % MT LIQD: 1.0000 | OROMUCOSAL | Status: DC | PRN

## 2013-06-09 MED ORDER — KCL IN DEXTROSE-NACL 20-5-0.45 MEQ/L-%-% IV SOLN
INTRAVENOUS | Status: DC
  Administered 2013-06-09: 125 mL/h via INTRAVENOUS
  Filled 2013-06-09 (×15): qty 1000

## 2013-06-09 MED ORDER — TRANEXAMIC ACID 100 MG/ML IV SOLN
1000.0000 mg | INTRAVENOUS | Status: AC
  Administered 2013-06-09: 1000 mg via INTRAVENOUS
  Filled 2013-06-09: qty 10

## 2013-06-09 MED ORDER — BUSPIRONE HCL 5 MG PO TABS
5.0000 mg | ORAL_TABLET | Freq: Three times a day (TID) | ORAL | Status: DC
  Administered 2013-06-09 – 2013-06-13 (×13): 5 mg via ORAL
  Filled 2013-06-09 (×17): qty 1

## 2013-06-09 MED ORDER — LIDOCAINE HCL (CARDIAC) 20 MG/ML IV SOLN
INTRAVENOUS | Status: AC
  Filled 2013-06-09: qty 5

## 2013-06-09 MED ORDER — QUETIAPINE FUMARATE 300 MG PO TABS
300.0000 mg | ORAL_TABLET | Freq: Every day | ORAL | Status: DC
  Administered 2013-06-09 – 2013-06-12 (×4): 300 mg via ORAL
  Filled 2013-06-09 (×5): qty 1

## 2013-06-09 MED ORDER — ROPINIROLE HCL 1 MG PO TABS
1.0000 mg | ORAL_TABLET | Freq: Every evening | ORAL | Status: DC | PRN
  Administered 2013-06-11: 2 mg via ORAL
  Administered 2013-06-12: 1 mg via ORAL
  Filled 2013-06-09 (×2): qty 2

## 2013-06-09 MED ORDER — LITHIUM CARBONATE 300 MG PO TABS: 750.0000 mg | ORAL_TABLET | Freq: Every day | ORAL | Status: DC

## 2013-06-09 MED ORDER — FLUOXETINE HCL 20 MG PO CAPS
20.0000 mg | ORAL_CAPSULE | Freq: Every day | ORAL | Status: DC
Start: 2013-06-09 — End: 2013-06-13
  Administered 2013-06-09 – 2013-06-13 (×5): 20 mg via ORAL
  Filled 2013-06-09 (×5): qty 1

## 2013-06-09 MED ORDER — ACETAMINOPHEN 325 MG PO TABS
650.0000 mg | ORAL_TABLET | Freq: Four times a day (QID) | ORAL | Status: DC | PRN
Start: 2013-06-09 — End: 2013-06-13
  Administered 2013-06-10 – 2013-06-11 (×3): 650 mg via ORAL
  Filled 2013-06-09 (×3): qty 2

## 2013-06-09 MED ORDER — HYDROMORPHONE HCL PF 1 MG/ML IJ SOLN
1.0000 mg | INTRAMUSCULAR | Status: DC | PRN
  Administered 2013-06-09 – 2013-06-11 (×14): 1 mg via INTRAVENOUS
  Filled 2013-06-09 (×14): qty 1

## 2013-06-09 MED ORDER — METHOCARBAMOL 500 MG PO TABS: 500.0000 mg | ORAL_TABLET | Freq: Two times a day (BID) | ORAL | Status: DC

## 2013-06-09 MED ORDER — METOCLOPRAMIDE HCL 5 MG/ML IJ SOLN: 5.0000 mg | Freq: Three times a day (TID) | INTRAMUSCULAR | Status: DC | PRN

## 2013-06-09 MED ORDER — ACETAMINOPHEN 650 MG RE SUPP: 650.0000 mg | Freq: Four times a day (QID) | RECTAL | Status: DC | PRN

## 2013-06-09 SURGICAL SUPPLY — 60 items
BLADE SAW SGTL 18X1.27X75 (BLADE) ×2 IMPLANT
BRUSH FEMORAL CANAL (MISCELLANEOUS) IMPLANT
CAPT HIP PF COP ×1 IMPLANT
COVER BACK TABLE 24X17X13 BIG (DRAPES) IMPLANT
COVER SURGICAL LIGHT HANDLE (MISCELLANEOUS) ×4 IMPLANT
DRAPE ORTHO SPLIT 77X108 STRL (DRAPES) ×2
DRAPE PROXIMA HALF (DRAPES) ×2 IMPLANT
DRAPE SURG ORHT 6 SPLT 77X108 (DRAPES) ×1 IMPLANT
DRAPE U-SHAPE 47X51 STRL (DRAPES) ×2 IMPLANT
DRILL BIT 7/64X5 (BIT) ×2 IMPLANT
DRSG AQUACEL AG ADV 3.5X10 (GAUZE/BANDAGES/DRESSINGS) ×2 IMPLANT
DURAPREP 26ML APPLICATOR (WOUND CARE) ×2 IMPLANT
ELECT BLADE 4.0 EZ CLEAN MEGAD (MISCELLANEOUS)
ELECT REM PT RETURN 9FT ADLT (ELECTROSURGICAL) ×2
ELECTRODE BLDE 4.0 EZ CLN MEGD (MISCELLANEOUS) IMPLANT
ELECTRODE REM PT RTRN 9FT ADLT (ELECTROSURGICAL) ×1 IMPLANT
GAUZE XEROFORM 1X8 LF (GAUZE/BANDAGES/DRESSINGS) ×2 IMPLANT
GLOVE BIO SURGEON STRL SZ7 (GLOVE) ×1 IMPLANT
GLOVE BIO SURGEON STRL SZ7.5 (GLOVE) ×2 IMPLANT
GLOVE BIO SURGEON STRL SZ8.5 (GLOVE) ×4 IMPLANT
GLOVE BIOGEL PI IND STRL 6.5 (GLOVE) IMPLANT
GLOVE BIOGEL PI IND STRL 7.0 (GLOVE) IMPLANT
GLOVE BIOGEL PI IND STRL 8 (GLOVE) ×2 IMPLANT
GLOVE BIOGEL PI IND STRL 9 (GLOVE) ×1 IMPLANT
GLOVE BIOGEL PI INDICATOR 6.5 (GLOVE) ×1
GLOVE BIOGEL PI INDICATOR 7.0 (GLOVE) ×2
GLOVE BIOGEL PI INDICATOR 8 (GLOVE) ×2
GLOVE BIOGEL PI INDICATOR 9 (GLOVE) ×1
GLOVE SURG SS PI 6.5 STRL IVOR (GLOVE) ×1 IMPLANT
GOWN STRL REUS W/ TWL LRG LVL3 (GOWN DISPOSABLE) ×2 IMPLANT
GOWN STRL REUS W/ TWL XL LVL3 (GOWN DISPOSABLE) ×3 IMPLANT
GOWN STRL REUS W/TWL LRG LVL3 (GOWN DISPOSABLE) ×4
GOWN STRL REUS W/TWL XL LVL3 (GOWN DISPOSABLE) ×6
HANDPIECE INTERPULSE COAX TIP (DISPOSABLE)
HOOD PEEL AWAY FACE SHEILD DIS (HOOD) ×4 IMPLANT
KIT BASIN OR (CUSTOM PROCEDURE TRAY) ×2 IMPLANT
KIT ROOM TURNOVER OR (KITS) ×2 IMPLANT
MANIFOLD NEPTUNE II (INSTRUMENTS) ×2 IMPLANT
NEEDLE 22X1 1/2 (OR ONLY) (NEEDLE) ×2 IMPLANT
NS IRRIG 1000ML POUR BTL (IV SOLUTION) ×2 IMPLANT
PACK TOTAL JOINT (CUSTOM PROCEDURE TRAY) ×2 IMPLANT
PAD ARMBOARD 7.5X6 YLW CONV (MISCELLANEOUS) ×4 IMPLANT
PASSER SUT SWANSON 36MM LOOP (INSTRUMENTS) ×2 IMPLANT
PRESSURIZER FEMORAL UNIV (MISCELLANEOUS) IMPLANT
SET HNDPC FAN SPRY TIP SCT (DISPOSABLE) IMPLANT
SUT ETHIBOND 2 V 37 (SUTURE) ×2 IMPLANT
SUT ETHILON 3 0 FSL (SUTURE) ×2 IMPLANT
SUT VIC AB 0 CTB1 27 (SUTURE) ×2 IMPLANT
SUT VIC AB 1 CTX 36 (SUTURE) ×2
SUT VIC AB 1 CTX36XBRD ANBCTR (SUTURE) ×1 IMPLANT
SUT VIC AB 2-0 CTB1 (SUTURE) ×2 IMPLANT
SUT VIC AB 3-0 SH 27 (SUTURE) ×4
SUT VIC AB 3-0 SH 27X BRD (SUTURE) IMPLANT
SUT VIC AB 3-0 SH 27XBRD (SUTURE) IMPLANT
SYR CONTROL 10ML LL (SYRINGE) ×2 IMPLANT
TOWEL OR 17X24 6PK STRL BLUE (TOWEL DISPOSABLE) ×2 IMPLANT
TOWEL OR 17X26 10 PK STRL BLUE (TOWEL DISPOSABLE) ×2 IMPLANT
TOWER CARTRIDGE SMART MIX (DISPOSABLE) IMPLANT
TRAY FOLEY CATH 14FR (SET/KITS/TRAYS/PACK) IMPLANT
WATER STERILE IRR 1000ML POUR (IV SOLUTION) ×8 IMPLANT

## 2013-06-09 NOTE — Evaluation (Signed)
Physical Therapy Evaluation Patient Details Name: [name] [name] MRN: [id number] DOB: Dec 07, 1945 Today's Date: 06/09/2013   History of Present Illness  Pt. admitted for R posterior THA  Clinical Impression  This pt. underwent a right posterior  THA and presents to PT with anticipated post-op decreased strength, functional mobility and gait.  Pt. Will benefit from acute PT to address these and below issues.  Pt. Did well first session on POD zero.  Anticipate good progress in PT over her acute stay.      Follow Up Recommendations Home health PT;Supervision/Assistance - 24 hour;Supervision for mobility/OOB    Equipment Recommendations  None recommended by PT;Other (comment) (defer bathroom equipment to OT)    Recommendations for Other Services OT consult     Precautions / Restrictions Precautions Precautions: Posterior Hip Precaution Booklet Issued: Yes (comment) Precaution Comments: reviewed posterior THA precautions Restrictions Weight Bearing Restrictions: Yes RLE Weight Bearing: Weight bearing as tolerated      Mobility  Bed Mobility Overal bed mobility: +2 for physical assistance             General bed mobility comments: min assist of 2 for bed mobility , assist at R LE and shoulders to sit upright  Transfers Overall transfer level: Needs assistance Equipment used: Rolling walker (2 wheeled) Transfers: Sit to/from Stand Sit to Stand: +2 physical assistance;Min assist         General transfer comment: vc' and 2 physical assist for rise to stand and control of descent  Ambulation/Gait Ambulation/Gait assistance: +2 physical assistance;Min assist Ambulation Distance (Feet): 3 Feet Assistive device: Rolling walker (2 wheeled) Gait Pattern/deviations: Step-to pattern;Decreased step length - right;Decreased step length - left Gait velocity: decreased      Stairs            Wheelchair Mobility    Modified Rankin (Stroke Patients Only)        Balance                                             Pertinent Vitals/Pain See vitals tab Pt. With significant pain but progressed through OOB to recliner.  RN notified and he was to bring pain med.    Home Living Family/patient expects to be discharged to:: Private residence Living Arrangements: Spouse/significant other Available Help at Discharge: Available 24 hours/day (for one week) Type of Home: House Home Access: Stairs to enter Entrance Stairs-Rails: None Entrance Stairs-Number of Steps: 2 (at back entrance) Home Layout: Two level;Able to live on main level with bedroom/bathroom Home Equipment: Gilford Rile - 2 wheels      Prior Function Level of Independence: Independent with assistive device(s)               Hand Dominance        Extremity/Trunk Assessment   Upper Extremity Assessment: Overall WFL for tasks assessed           Lower Extremity Assessment: RLE deficits/detail RLE Deficits / Details: pt. had good ankle pumps and good quad set    Cervical / Trunk Assessment: Normal  Communication   Communication: No difficulties  Cognition Arousal/Alertness: Awake/alert Behavior During Therapy: WFL for tasks assessed/performed Overall Cognitive Status: Within Functional Limits for tasks assessed                      General Comments      Exercises  Total Joint Exercises Ankle Circles/Pumps: AROM;Both;10 reps;Supine Quad Sets: AROM;Both;10 reps;Supine      Assessment/Plan    PT Assessment Patient needs continued PT services  PT Diagnosis Difficulty walking;Acute pain   PT Problem List Decreased strength;Decreased activity tolerance;Decreased mobility;Decreased knowledge of use of DME;Decreased knowledge of precautions;Pain  PT Treatment Interventions DME instruction;Gait training;Stair training;Functional mobility training;Therapeutic activities;Therapeutic exercise;Patient/family education   PT Goals (Current goals can  be found in the Care Plan section) Acute Rehab PT Goals Patient Stated Goal: home for recovery  PT Goal Formulation: With patient Time For Goal Achievement: 06/16/13 Potential to Achieve Goals: Good    Frequency 7X/week   Barriers to discharge        Co-evaluation               End of Session Equipment Utilized During Treatment: Gait belt;Oxygen Activity Tolerance: Patient tolerated treatment well;Patient limited by pain Patient left: in chair;with call bell/phone within reach;with family/visitor present Nurse Communication: Mobility status;Patient requests pain meds;Weight bearing status;Precautions         Time: 1435-1459 PT Time Calculation (min): 24 min   Charges:   PT Evaluation $Initial PT Evaluation Tier I: 1 Procedure PT Treatments $Gait Training: 8-22 mins   PT G Codes:          Ladona Ridgel 06/09/2013, 3:14 PM Gerlean Ren PT Acute Rehab Services [phone] [place] [phone]

## 2013-06-09 NOTE — Care Management Note (Signed)
CARE MANAGEMENT NOTE 06/09/2013  Patient:  [name] [name],[name] [name]   Account Number:  [id number]  Date Initiated:  06/09/2013  Documentation initiated by:  Ricki Miller  Subjective/Objective Assessment:   68 yr old female admitted with Dejenerative joint disease, s/p right total hip arthroplasty.     Action/Plan:   Case manager spoke with patient's husband. patient was preoperatively setup with [place], no changes. Has rolling walker already. Patient has family support at discharge.   Anticipated DC Date:  06/11/2013   Anticipated DC Plan:  [place]  CM consult      [facility] Choice  HOME HEALTH  DURABLE MEDICAL EQUIPMENT   Choice offered to / List presented to:  C-3 Spouse   DME arranged  3-N-1      DME agency  [employer]     [place] arranged  HH-2 PT      [place].   Status of service:  In process, will continue to follow

## 2013-06-09 NOTE — Transfer of Care (Signed)
Immediate Anesthesia Transfer of Care Note  Patient: [name] [name]  Procedure(s) Performed: Procedure(s): TOTAL HIP ARTHROPLASTY (Right)  Patient Location: PACU  Anesthesia Type:General  Level of Consciousness: awake, alert , oriented and patient cooperative  Airway & Oxygen Therapy: Patient Spontanous Breathing  Post-op Assessment: Report given to PACU RN, Post -op Vital signs reviewed and stable and Patient moving all extremities  Post vital signs: Reviewed and stable  Complications: No apparent anesthesia complications

## 2013-06-09 NOTE — Op Note (Signed)
OPERATIVE REPORT    DATE OF PROCEDURE:  06/09/2013       PREOPERATIVE DIAGNOSIS:  DEGENERATIVE JOINT DISEASE                                                          POSTOPERATIVE DIAGNOSIS:  DEGENERATIVE JOINT DISEASE                                                           PROCEDURE:  R total hip arthroplasty using a 48 mm DePuy Pinnacle  Cup, [employer], 10-degree polyethylene liner index superior  and posterior, a +3 36 mm ceramic head, a 16x11x150x36 SROM stem, 16Bsm Sleeve   SURGEON: Paz Winsett J    ASSISTANT:   [employer]  (present throughout entire procedure and necessary for timely completion of the procedure)   ANESTHESIA: General BLOOD LOSS: 300 FLUID REPLACEMENT: 1500 crystalloid DRAINS: Foley Catheter URINE OUTPUT: 578IO COMPLICATIONS: none    INDICATIONS FOR PROCEDURE: A 68 y.o. year-old With  [place]   for 3 years, x-rays show bone-on-bone arthritic changes. Despite conservative measures with observation, anti-inflammatory medicine, narcotics, use of a cane, has severe unremitting pain and can ambulate only a few blocks before resting.  Patient desires elective R total hip arthroplasty to decrease pain and increase function. The risks, benefits, and alternatives were discussed at length including but not limited to the risks of infection, bleeding, nerve injury, stiffness, blood clots, the need for revision surgery, cardiopulmonary complications, among others, and they were willing to proceed. Questions answered     PROCEDURE IN DETAIL: The patient was identified by armband,  received preoperative IV antibiotics in the holding area at [facility], taken to the operating room , appropriate anesthetic monitors  were attached and general endotracheal anesthesia induced. Foley catheter was inserted. Pt was rolled into the L lateral decubitus position and fixed there with a Stulberg Mark II pelvic clamp.  The R lower extremity  was then prepped and draped  in the usual sterile fashion from the ankle to the hemipelvis. A time-out  procedure was performed. The skin along the lateral hip and thigh  infiltrated with 10 mL of 0.5% Marcaine and epinephrine solution. We  then made a posterolateral approach to the hip. With a #10 blade, a 18 cm  incision was made through the skin and subcutaneous tissue down to the level of the  IT band. Small bleeders were identified and cauterized. The IT band was cut in  line with skin incision exposing the greater trochanter. A Cobra retractor was placed between the gluteus minimus and the superior hip joint capsule, and a spiked Cobra between the quadratus femoris and the inferior hip joint capsule. This isolated the short  external rotators and piriformis tendons. These were tagged with a #2 Ethibond  suture and cut off their insertion on the intertrochanteric crest. The posterior  capsule was then developed into an acetabular-based flap from Posterior Superior off of the acetabulum out over the femoral neck and back posterior inferior to the acetabular rim. This flap was tagged with two #2 Ethibond  sutures and retracted protecting the sciatic nerve. This exposed the arthritic femoral head and osteophytes. The hip was then flexed and internally rotated, dislocating the femoral head and a standard neck cut performed 1 fingerbreadth above the lesser trochanter.  A spiked Cobra was placed in the cotyloid notch and a Hohmann retractor was then used to lever the femur anteriorly off of the anterior pelvic column. A posterior-inferior wing retractor was placed at the junction of the acetabulum and the ischium completing the acetabular exposure.We then removed the peripheral osteophytes and labrum from the acetabulum. We then reamed the acetabulum up to 47 mm with basket reamers obtaining good coverage in all quadrants. We then irrigated with normal  saline solution and hammered into place a 48 mm  pinnacle cup in 45  degrees of abduction and about 20 degrees of anteversion. More  peripheral osteophytes removed and a trial 10-degree liner placed with the  index superior-posterior. The hip was then flexed and internally rotated exposing the  proximal femur, which was entered with the initiating reamer followed by  the axial reamers up to a 11.5 mm full depth and 64mm partial depth. We then conically reamed to 16B to the correct depth for a 42 base neck. The calcar was milled to 16Bsm. A trial cone and stem was inserted in the 25 degrees anteversion, with a +0 26mm trial head. Trial reduction was then performed and excellent stability was noted with at 90 of flexion with 75 of internal rotation and then full extension with maximal external rotation. The hip could not be dislocated in full extension. The knee could easily flex  to about 130 degrees. We also stretched the abductors at this point,  because of the preexisting adductor contractures. All trial components  were then removed. The acetabulum was irrigated out with normal saline  solution. A titanium [facility] was then screwed into place  followed by a 10-degree polyethylene liner index superior-posterior. On  the femoral side a 16Bsm ZTT1 sleeve was hammered into place, followed by a 16x11x150x36 SROM stem in 25 degrees of anteversion. At this point, a +0 36 mm ceramic head was  hammered on the stem. The hip was reduced. We checked our stability  one more time and found it to be excellent. The wound was once again  thoroughly irrigated out with normal saline solution pulse lavage. The  capsular flap and short external rotators were repaired back to the  intertrochanteric crest through drill holes with a #2 Ethibond suture.  The IT band was closed with running 1 Vicryl suture. The subcutaneous  tissue with 0 and 2-0 undyed Vicryl suture and the skin with running  interlocking 3-0 nylon suture. Dressing of Xeroform and Mepilex  was  then applied. The patient was then unclamped, rolled supine, awaken extubated and taken to recovery room without difficulty in stable condition.   Jeriko Kowalke J 06/09/2013, 8:43 AM

## 2013-06-09 NOTE — Progress Notes (Signed)
Utilization review completed. Kayde Atkerson, RN, BSN. 

## 2013-06-09 NOTE — Anesthesia Preprocedure Evaluation (Addendum)
Anesthesia Evaluation  Patient identified by MRN, date of birth, ID band Patient awake    Reviewed: Allergy & Precautions, H&P , NPO status , Patient's Chart, lab work & pertinent test results, reviewed documented beta blocker date and time   Airway Mallampati: II TM Distance: >3 FB Neck ROM: Full    Dental  (+) Teeth Intact, Caps, Dental Advisory Given   Pulmonary          Cardiovascular     Neuro/Psych PSYCHIATRIC DISORDERS Bipolar Disorder    GI/Hepatic   Endo/Other  diabetes, Well Controlled, Type 2  Renal/GU      Musculoskeletal  (+) Fibromyalgia -  Abdominal   Peds  Hematology   Anesthesia Other Findings   Reproductive/Obstetrics                          Anesthesia Physical Anesthesia Plan  ASA: II  Anesthesia Plan: General   Post-op Pain Management:    Induction: Intravenous  Airway Management Planned: Oral ETT  Additional Equipment:   Intra-op Plan:   Post-operative Plan:   Informed Consent:   Plan Discussed with:   Anesthesia Plan Comments:         Anesthesia Quick Evaluation

## 2013-06-09 NOTE — Interval H&P Note (Signed)
History and Physical Interval Note:  06/09/2013 7:12 AM  [name] [name]  has presented today for surgery, with the diagnosis of DEGENERATIVE JOINT DISEASE  The various methods of treatment have been discussed with the patient and family. After consideration of risks, benefits and other options for treatment, the patient has consented to  Procedure(s): TOTAL HIP ARTHROPLASTY (Right) as a surgical intervention .  The patient's history has been reviewed, patient examined, no change in status, stable for surgery.  I have reviewed the patient's chart and labs.  Questions were answered to the patient's satisfaction.     Kerin Salen

## 2013-06-09 NOTE — Anesthesia Procedure Notes (Addendum)
Date/Time: 06/09/2013 7:40 AM Performed by: Williemae Area B   Anesthesia Procedure Note

## 2013-06-09 NOTE — Anesthesia Postprocedure Evaluation (Signed)
  Anesthesia Post-op Note  Patient: [name] [name]  Procedure(s) Performed: Procedure(s): TOTAL HIP ARTHROPLASTY (Right)  Patient Location: PACU  Anesthesia Type:General  Level of Consciousness: awake, alert  and oriented  Airway and Oxygen Therapy: Patient Spontanous Breathing and Patient connected to nasal cannula oxygen  Post-op Pain: mild  Post-op Assessment: Post-op Vital signs reviewed  Post-op Vital Signs: Reviewed  Complications: No apparent anesthesia complications

## 2013-06-09 NOTE — Plan of Care (Signed)
Problem: Acute Rehab PT Goals(only PT should resolve) Goal: Pt Will Verbalize and Adhere to Precautions While PT Will Verbalize and Adhere to Precautions While Performing Mobility Posterior hip

## 2013-06-10 LAB — GLUCOSE, CAPILLARY
Glucose-Capillary: 137 mg/dL — ABNORMAL HIGH (ref 70–99)
Glucose-Capillary: 128 mg/dL — ABNORMAL HIGH (ref 70–99)
Glucose-Capillary: 162 mg/dL — ABNORMAL HIGH (ref 70–99)
Glucose-Capillary: 143 mg/dL — ABNORMAL HIGH (ref 70–99)

## 2013-06-10 LAB — CBC
HCT: 36.3 % (ref 36.0–46.0)
Hemoglobin: 11.8 g/dL — ABNORMAL LOW (ref 12.0–15.0)
MCH: 29.6 pg (ref 26.0–34.0)
MCHC: 32.5 g/dL (ref 30.0–36.0)
MCV: 91.2 fL (ref 78.0–100.0)
Platelets: 193 10*3/uL (ref 150–400)
RBC: 3.98 MIL/uL (ref 3.87–5.11)
RDW: 13.4 % (ref 11.5–15.5)
WBC: 11 10*3/uL — ABNORMAL HIGH (ref 4.0–10.5)

## 2013-06-10 LAB — BASIC METABOLIC PANEL
BUN: 8 mg/dL (ref 6–23)
CO2: 24 mEq/L (ref 19–32)
Calcium: 9.1 mg/dL (ref 8.4–10.5)
Chloride: 99 mEq/L (ref 96–112)
Creatinine, Ser: 1.04 mg/dL (ref 0.50–1.10)
GFR calc Af Amer: 64 mL/min — ABNORMAL LOW (ref >90)
GFR calc non Af Amer: 55 mL/min — ABNORMAL LOW (ref >90)
Glucose, Bld: 148 mg/dL — ABNORMAL HIGH (ref 70–99)
Potassium: 4.7 mEq/L (ref 3.7–5.3)
Sodium: 136 mEq/L — ABNORMAL LOW (ref 137–147)

## 2013-06-10 NOTE — Progress Notes (Signed)
Occupational Therapy Evaluation Patient Details Name: [name] [name] MRN: [id number] DOB: September 29, 1945 Today's Date: 06/10/2013    History of Present Illness Pt. 68 y.o Female admitted for R posterior THA   Clinical Impression   PTA pt lived at home with husband and was modified independent for ADLs and mobility with use of RW. Education and training provided regarding compensatory technique for LB ADLs. Pt requested to use the [facility] and completed transfer from recliner to [facility] with +2 assistance. Pt demonstrating decreased endurance, decreased strength, and increased pain. Pt also lethargic, likely due to medication, which is limiting pt's ability to participate in therapy. Feel that pt would benefit from SNF for above problems  to increase independence and safety prior to returning home. Pt would benefit from continued OT services to address ADL performance.     Follow Up Recommendations  SNF;Supervision/Assistance - 24 hour    Equipment Recommendations  None recommended by OT       Precautions / Restrictions Precautions Precautions: Posterior Hip Precaution Booklet Issued: Yes (comment) Precaution Comments: reviewed posterior THA precautions Restrictions Weight Bearing Restrictions: No RLE Weight Bearing: Weight bearing as tolerated      Mobility               General bed mobility comments: Pt received sitting in the chair  Transfers Overall transfer level: Needs assistance Equipment used: Rolling walker (2 wheeled) Transfers: Sit to/from [employer] Transfers Sit to Stand: +2 physical assistance;Min assist Stand pivot transfers: +2 physical assistance;Min assist       General transfer comment: VC's for hand placement and physical assistance on both sides to rise and stand/control of descent.          ADL Overall ADL's : Needs assistance/impaired Eating/Feeding: Independent;Sitting   Grooming: Set up;Sitting   Upper Body Bathing: Set up;Sitting   Lower  Body Bathing: Moderate assistance;Sit to/from stand (due to lethargy from medications and pain)   Upper Body Dressing : Minimal assistance;Sitting   Lower Body Dressing: Maximal assistance;Sit to/from stand;Adhering to hip precautions   Toilet Transfer: +2 for physical assistance;Cueing for safety;Cueing for sequencing;Stand-pivot;BSC;RW;Minimal assistance   Toileting- Clothing Manipulation and Hygiene: Total assistance;Adhering to hip precautions;Sit to/from stand;+2 for physical assistance (one person to assist pt with balance and one to perform hygi)       Functional mobility during ADLs: +2 for physical assistance;Rolling walker;Cueing for sequencing;Cueing for safety;Minimal assistance General ADL Comments: Pt lethargic possibly due to medications. Safety concerns regarding pt balance in sitting and standing for ADL tasks.               Pertinent Vitals/Pain Pt c/o sharp pain in R hip; does not provide pain level. Pt also c/o pains in stomach and RN notified. Repositioned pt in recliner after OT session. During transfer, pt's O2 dropped to low 80's, however possibly a result of pt's strong grasp of RW as it immediately resolved when pt sat down. Pt HR increased to 132 bpm during transfer and pt encouraged to take deep, slow breaths. RN also notified.      Hand Dominance Right   Extremity/Trunk Assessment Upper Extremity Assessment Upper Extremity Assessment: Overall WFL for tasks assessed   Lower Extremity Assessment Lower Extremity Assessment: Defer to PT evaluation   Cervical / Trunk Assessment Cervical / Trunk Assessment: Normal   Communication Communication Communication: No difficulties   Cognition Arousal/Alertness: Awake/alert Behavior During Therapy: WFL for tasks assessed/performed Overall Cognitive Status: Within Functional Limits for tasks assessed  Home Living Family/patient expects to be discharged to:: Skilled  nursing facility                                        Prior Functioning/Environment Level of Independence: Independent with assistive device(s)             OT Diagnosis: Generalized weakness;Acute pain   OT Problem List: Decreased strength;Decreased range of motion;Decreased activity tolerance;Impaired balance (sitting and/or standing);Decreased safety awareness;Decreased knowledge of precautions;Decreased knowledge of use of DME or AE;Pain   OT Treatment/Interventions: Self-care/ADL training;Therapeutic exercise;DME and/or AE instruction;Therapeutic activities;Patient/family education;Balance training    OT Goals(Current goals can be found in the care plan section) Acute Rehab OT Goals Patient Stated Goal: Rehab to increase strength before going home OT Goal Formulation: With patient Time For Goal Achievement: 06/17/13 Potential to Achieve Goals: Good  OT Frequency: Min 2X/week   Barriers to D/C: Decreased caregiver support Husband available at home to assist with ADLs, however unable to provide level of physical assistance pt requires (+2 physical A, min A)          End of Session Equipment Utilized During Treatment: Gait belt;Rolling walker Nurse Communication: Other (comment) (recommending SNF; vitals during transfer)  Activity Tolerance: Patient limited by lethargy;Patient limited by pain Patient left: in chair;with call bell/phone within reach;with family/visitor present;Other (comment) (with PT waiting to work with pt)   Time: 1610-9604 OT Time Calculation (min): 37 min Charges:  OT General Charges $OT Visit: 1 Procedure OT Evaluation $Initial OT Evaluation Tier I: 1 Procedure OT Treatments $Self Care/Home Management : 23-37 mins  Juluis Rainier [phone] 06/10/2013, 2:43 PM

## 2013-06-10 NOTE — Progress Notes (Signed)
Physical Therapy Treatment Patient Details Name: [name] [name] MRN: [id number] DOB: 03-Nov-1945 Today's Date: 06/10/2013    History of Present Illness Pt. admitted for R posterior THA    PT Comments    Focus of today's session is therapeutic exercise, as pt had just transferred to/from the [facility] and was too tired for out of chair activity. Pt required increased assist to perform AROM and was very lethargic throughout session. Husband states that he feels he cannot care for the patient at home by himself, as nursing is reporting +2 assist for transfers, and pt states she is nervous as well about returning home. Will see for second session this afternoon to focus on gait training, but anticipating pt will need post-acute rehab prior to returning home if significant progress is not made prior to d/c.  Follow Up Recommendations  SNF;Supervision/Assistance - 24 hour     Equipment Recommendations  None recommended by PT;Other (comment)    Recommendations for Other Services       Precautions / Restrictions Precautions Precautions: Posterior Hip Precaution Booklet Issued: Yes (comment) Precaution Comments: reviewed posterior THA precautions Restrictions Weight Bearing Restrictions: No RLE Weight Bearing: Weight bearing as tolerated    Mobility  Bed Mobility               General bed mobility comments: Pt received sitting in the chair  Transfers                 General transfer comment: NT - focus of session on [place] exercise  Ambulation/Gait                 Stairs            Wheelchair Mobility    Modified Rankin (Stroke Patients Only)       Balance                                    Cognition Arousal/Alertness: Awake/alert Behavior During Therapy: WFL for tasks assessed/performed Overall Cognitive Status: Within Functional Limits for tasks assessed                      Exercises Total Joint Exercises Ankle  Circles/Pumps: 10 reps Quad Sets: 10 reps Short Arc Quad: 10 reps Heel Slides: 10 reps;AAROM Hip ABduction/ADduction: 10 reps;AAROM    General Comments        Pertinent Vitals/Pain Pt reports increased pain with therapeutic exercise, RN notified    Home Living                      Prior Function            PT Goals (current goals can now be found in the care plan section) Acute Rehab PT Goals Patient Stated Goal: home for recovery  PT Goal Formulation: With patient Time For Goal Achievement: 06/16/13 Potential to Achieve Goals: Good Progress towards PT goals: Progressing toward goals    Frequency  7X/week    PT Plan Discharge plan needs to be updated    Co-evaluation             End of Session   Activity Tolerance: Patient limited by lethargy Patient left: in chair;with call bell/phone within reach;with family/visitor present     Time: 1235-1259 PT Time Calculation (min): 24 min  Charges:  $Therapeutic Exercise: 23-37 mins  G Codes:      Jolyn Lent 06/30/13, 1:52 PM  Jolyn Lent, PT, DPT Acute Rehabilitation Services Pager: [phone]

## 2013-06-10 NOTE — Progress Notes (Signed)
PATIENT ID: [name] [name]  MRN: [id number]  DOB/AGE:  1945/12/06 / 68 y.o.  1 Day Post-Op Procedure(s) (LRB): TOTAL HIP ARTHROPLASTY (Right)    PROGRESS NOTE Subjective: Patient is alert, oriented,no Nausea, no Vomiting, yes passing gas, no Bowel Movement. Taking PO well. Denies SOB, Chest or Calf Pain. Using Incentive Spirometer, PAS in place. Ambulate WBAT Patient reports pain as 5 on 0-10 scale and 10 on 0-10 scale  .    Objective: Vital signs in last 24 hours: Filed Vitals:   06/09/13 1533 06/09/13 2023 06/10/13 0124 06/10/13 0648  BP: 110/62 117/57 114/68 121/72  Pulse: 91 89 97 85  Temp: 98.1 F (36.7 C) 98.2 F (36.8 C) 98.4 F (36.9 C) 98.6 F (37 C)  TempSrc:  Oral Oral Oral  Resp: 18 18 18 18   Height:      Weight:      SpO2: 98% 97% 98% 98%      Intake/Output from previous day: I/O last 3 completed shifts: In: 2530 [P.O.:530; I.V.:2000] Out: 300 [Blood:300]   Intake/Output this shift: Total I/O In: 360 [P.O.:360] Out: -    LABORATORY DATA:  Recent Labs  06/09/13 1622 06/09/13 2131 06/10/13 0458 06/10/13 0637  WBC  --   --  11.0*  --   HGB  --   --  11.8*  --   HCT  --   --  36.3  --   PLT  --   --  193  --   NA  --   --  136*  --   K  --   --  4.7  --   CL  --   --  99  --   CO2  --   --  24  --   BUN  --   --  8  --   CREATININE  --   --  1.04  --   GLUCOSE  --   --  148*  --   GLUCAP 141* 129*  --  137*  CALCIUM  --   --  9.1  --     Examination: Neurologically intact Neurovascular intact Sensation intact distally Intact pulses distally Dorsiflexion/Plantar flexion intact Incision: dressing C/D/I No cellulitis present Compartment soft} XR AP&Lat of hip shows well placed\fixed THA  Assessment:   1 Day Post-Op Procedure(s) (LRB): TOTAL HIP ARTHROPLASTY (Right) ADDITIONAL DIAGNOSIS:  Diabetes and Hypertension, IBS, Bipolar 1 disorder, fibromyalgia, depression/anxiety  Plan: PT/OT WBAT, THA  posterior precautions  DVT  Prophylaxis: SCDx72 hrs, ASA 325 mg BID x 2 weeks  DISCHARGE PLAN: Home  DISCHARGE NEEDS: HHPT, HHRN, Walker and 3-in-1 comode seat

## 2013-06-10 NOTE — Care Management Note (Signed)
CARE MANAGEMENT NOTE 06/10/2013  Patient:  [name] [name]   Account Number:  [id number]  Date Initiated:  06/09/2013  Documentation initiated by:  Ricki Miller  Subjective/Objective Assessment:   68 yr old female admitted with Dejenerative joint disease, s/p right total hip arthroplasty.     Action/Plan:   Case manager spoke with patient's husband. patient was preoperatively setup with [place], no changes. Has rolling walker already. Patient has family support at discharge.   Anticipated DC Date:  06/11/2013   Anticipated DC Plan:  [place]  CM consult      [facility] Choice  HOME HEALTH  DURABLE MEDICAL EQUIPMENT   Choice offered to / List presented to:  C-3 Spouse   DME arranged  3-N-1  [employer]      DME agency  [employer]     HH arranged  HH-2 PT      [place].   Status of service:  Completed, signed off Medicare Important Message given?   (If response is "NO", the following Medicare IM given date fields will be blank) Date Medicare IM given:   Date Additional Medicare IM given:    Discharge Disposition:  [place]

## 2013-06-10 NOTE — Progress Notes (Signed)
Physical Therapy Treatment Patient Details Name: [name] [name] MRN: [id number] DOB: 1945/04/01 Today's Date: 06/10/2013    History of Present Illness Pt. 68 y.o Female admitted for R posterior THA    PT Comments    Focus of session was gait training, and pt was able to ambulate 10 feet. +2 assist required for close chair follow and safety. Pt and husband agreeable to d/c to rehab at a SNF level for continued strengthening and gait/transfer training. Will continue to follow.   Follow Up Recommendations  SNF;Supervision/Assistance - 24 hour     Equipment Recommendations  None recommended by PT;Other (comment)    Recommendations for Other Services       Precautions / Restrictions Precautions Precautions: Fall;Posterior Hip Precaution Booklet Issued: Yes (comment) Precaution Comments: Pt able to state 0/3 hip precautions. Reviewed 3/3 hip precautions with pt and husband Restrictions Weight Bearing Restrictions: No RLE Weight Bearing: Weight bearing as tolerated    Mobility  Bed Mobility               General bed mobility comments: Pt received sitting in the chair  Transfers Overall transfer level: Needs assistance Equipment used: Rolling walker (2 wheeled) Transfers: Sit to/from Stand Sit to Stand: +2 physical assistance;Min assist Stand pivot transfers: +2 physical assistance;Min assist       General transfer comment: VC's for hand placement on seated surface for safety. +2 for safety and support as pt was unsteady with initiating transfers.  Ambulation/Gait Ambulation/Gait assistance: Min assist Ambulation Distance (Feet): 10 Feet Assistive device: Rolling walker (2 wheeled) Gait Pattern/deviations: Step-to pattern;Decreased stride length;Trunk flexed Gait velocity: decreased Gait velocity interpretation: Below normal speed for age/gender General Gait Details: +2 for close chair follow and safety. Pt very lethargic and moving slowly, requiring cues to keep  eyes open and for sequencing. Pt with DOE, and was also cued for pursed-lip breathing. Upon seated rest break, pt at 91% O2 saturation on RA.    Stairs            Wheelchair Mobility    Modified Rankin (Stroke Patients Only)       Balance Overall balance assessment: Needs assistance Sitting-balance support: Feet supported Sitting balance-Leahy Scale: Fair     Standing balance support: Bilateral upper extremity supported Standing balance-Leahy Scale: Poor                      Cognition Arousal/Alertness: Awake/alert Behavior During Therapy: WFL for tasks assessed/performed Overall Cognitive Status: Within Functional Limits for tasks assessed                      Exercises Total Joint Exercises Ankle Circles/Pumps: 10 reps Quad Sets: 10 reps Short Arc Quad: 10 reps Heel Slides: 10 reps;AAROM Hip ABduction/ADduction: 10 reps;AAROM    General Comments General comments (skin integrity, edema, etc.): Pt/family education regarding benefits of rehab vs. home and therapy recommendation for rehab at a SNF level prior to returning home.       Pertinent Vitals/Pain Pt does not report any pain throughout session. Pt at 93% O2 saturation on RA resting in a seated position. At end of ambulation pt SOB and at 91% O2 saturation on RA. Supplemental O2 was donned (pt received wearing O2) and pt's SOB decreased with seated rest break.     Home Living Family/patient expects to be discharged to:: Skilled nursing facility  Prior Function Level of Independence: Independent with assistive device(s)          PT Goals (current goals can now be found in the care plan section) Acute Rehab PT Goals Patient Stated Goal: Rehab to increase strength before going home PT Goal Formulation: With patient Time For Goal Achievement: 06/16/13 Potential to Achieve Goals: Good Progress towards PT goals: Progressing toward goals    Frequency  7X/week     PT Plan Current plan remains appropriate    Co-evaluation             End of Session Equipment Utilized During Treatment: Gait belt;Oxygen Activity Tolerance: Patient limited by lethargy Patient left: in chair;with call bell/phone within reach;with family/visitor present     Time: 6226-3335 PT Time Calculation (min): 22 min  Charges:  $Gait Training: 8-22 mins $Therapeutic Exercise: 23-37 mins                    G Codes:      Jolyn Lent 2013/07/10, 3:28 PM  Jolyn Lent, PT, DPT Acute Rehabilitation Services Pager: [phone]

## 2013-06-11 LAB — CBC
HCT: 34.9 % — ABNORMAL LOW (ref 36.0–46.0)
Hemoglobin: 11.4 g/dL — ABNORMAL LOW (ref 12.0–15.0)
MCH: 29.9 pg (ref 26.0–34.0)
MCHC: 32.7 g/dL (ref 30.0–36.0)
MCV: 91.6 fL (ref 78.0–100.0)
Platelets: 163 10*3/uL (ref 150–400)
RBC: 3.81 MIL/uL — ABNORMAL LOW (ref 3.87–5.11)
RDW: 13.4 % (ref 11.5–15.5)
WBC: 9.8 10*3/uL (ref 4.0–10.5)

## 2013-06-11 LAB — GLUCOSE, CAPILLARY
Glucose-Capillary: 138 mg/dL — ABNORMAL HIGH (ref 70–99)
Glucose-Capillary: 146 mg/dL — ABNORMAL HIGH (ref 70–99)
Glucose-Capillary: 150 mg/dL — ABNORMAL HIGH (ref 70–99)
Glucose-Capillary: 127 mg/dL — ABNORMAL HIGH (ref 70–99)

## 2013-06-11 NOTE — Progress Notes (Addendum)
Clinical Social Work Department CLINICAL SOCIAL WORK PLACEMENT NOTE 06/11/2013  Patient:  [name] [name], [name] [name]  Account Number:  [id number] Admit date:  06/09/2013  Clinical Social Worker:  Tilden Fossa, Latanya Presser  Date/time:  06/11/2013 05:50 PM  Clinical Social Work is seeking post-discharge placement for this patient at the following level of care:   SKILLED NURSING   (*CSW will update this form in Epic as items are completed)   06/11/2013  Patient/family provided with [place] Department of Clinical Social Work's list of facilities offering this level of care within the geographic area requested by the patient (or if unable, by the patient's family).  06/11/2013  Patient/family informed of their freedom to choose among providers that offer the needed level of care, that participate in Medicare, Medicaid or managed care program needed by the patient, have an available bed and are willing to accept the patient.    Patient/family informed of [facility]' ownership interest in [facility], as well as of the fact that they are under no obligation to receive care at this facility.  PASARR submitted to EDS on 06/11/2013 PASARR number received from EDS on 06/11/2013  FL2 transmitted to all facilities in geographic area requested by pt/family on  06/11/2013 FL2 transmitted to all facilities within larger geographic area on   Patient informed that his/her managed care company has contracts with or will negotiate with  certain facilities, including the following:     Patient/family informed of bed offers received:  06/12/13 Patient chooses bed at [facility] Physician recommends and patient chooses bed at    Patient to be transferred to  on  06/13/2013 Patient to be transferred to facility by [facility]  The following physician request were entered in Epic:   Additional Comments:  Tilden Fossa, MSW, [place] Worker [facility] Emergency Dept. [phone]

## 2013-06-11 NOTE — Progress Notes (Signed)
PATIENT ID: [name] [name]  MRN: [id number]  DOB/AGE:  [birth year] / 68 y.o.  2 Days Post-Op Procedure(s) (LRB): TOTAL HIP ARTHROPLASTY (Right)    PROGRESS NOTE Subjective: Patient is alert, oriented,no Nausea, no Vomiting, yes passing gas, no Bowel Movement. Taking PO well. Denies SOB, Chest or Calf Pain. Using Incentive Spirometer, PAS in place. Ambulate WBAT Patient reports pain as 10 on 0-10 scale  .    Objective: Vital signs in last 24 hours: Filed Vitals:   06/10/13 1200 06/10/13 1510 06/10/13 2146 06/11/13 0300  BP:  96/66 114/59 120/57  Pulse:  105 100 105  Temp:  98.2 F (36.8 C) 99.5 F (37.5 C) 97.8 F (36.6 C)  TempSrc:   Oral Axillary  Resp: 18 18 16 18   Height:      Weight:      SpO2: 96% 96% 96% 98%      Intake/Output from previous day: I/O last 3 completed shifts: In: 2040 [P.O.:1440; I.V.:600] Out: -    Intake/Output this shift:     LABORATORY DATA:  Recent Labs  06/10/13 0458  06/10/13 1101 06/10/13 1549 06/10/13 2148 06/11/13 0430  WBC 11.0*  --   --   --   --  9.8  HGB 11.8*  --   --   --   --  11.4*  HCT 36.3  --   --   --   --  34.9*  PLT 193  --   --   --   --  163  NA 136*  --   --   --   --   --   K 4.7  --   --   --   --   --   CL 99  --   --   --   --   --   CO2 24  --   --   --   --   --   BUN 8  --   --   --   --   --   CREATININE 1.04  --   --   --   --   --   GLUCOSE 148*  --   --   --   --   --   GLUCAP  --   < > 128* 162* 143*  --   CALCIUM 9.1  --   --   --   --   --   < > = values in this interval not displayed.  Examination: Neurologically intact ABD soft Neurovascular intact Sensation intact distally Intact pulses distally Dorsiflexion/Plantar flexion intact Incision: dressing C/D/I No cellulitis present Compartment soft} XR AP&Lat of hip shows well placed\fixed THA  Assessment:   2 Days Post-Op Procedure(s) (LRB): TOTAL HIP ARTHROPLASTY (Right) ADDITIONAL DIAGNOSIS:  Diabetes and Hypertension, IBS, Bipolar  1 disorder, fibromyalgia, depression/anxiety  Plan: PT/OT WBAT, THA  posterior precautions  DVT Prophylaxis: SCDx72 hrs, ASA 325 mg BID x 2 weeks  DISCHARGE PLAN: Skilled Nursing Facility/Rehab  DISCHARGE NEEDS: HHPT, HHRN, Walker and 3-in-1 comode seat

## 2013-06-11 NOTE — Progress Notes (Signed)
   CARE MANAGEMENT NOTE 06/11/2013  Patient:  [name] [name],[name] [name]   Account Number:  [id number]  Date Initiated:  06/09/2013  Documentation initiated by:  Ricki Miller  Subjective/Objective Assessment:   68 yr old female admitted with Dejenerative joint disease, s/p right total hip arthroplasty.     Action/Plan:   Case manager spoke with patient's husband. patient was preoperatively setup with [place], no changes. Has rolling walker already. Patient has family support at discharge.   Anticipated DC Date:  06/11/2013   Anticipated DC Plan:  [place]  CM consult      [facility] Choice  HOME HEALTH  DURABLE MEDICAL EQUIPMENT   Choice offered to / List presented to:  C-3 Spouse   DME arranged  3-N-1  [employer]      DME agency  [employer]     HH arranged  HH-2 PT      [place].   Status of service:  Completed, signed off Medicare Important Message given?   (If response is "NO", the following Medicare IM given date fields will be blank) Date Medicare IM given:   Date Additional Medicare IM given:    Discharge Disposition:  [place]  Per UR Regulation:    If discussed at Long Length of Stay Meetings, dates discussed:    Comments:  06/11/13 11:15 CM received call from [place] stating pt has decided not to go home with home health but to a SNF.  CM called [facility] to notify of disposition of pt.  No other CM needs were communicated.  Mariane Masters, BSN, Cm [phone].

## 2013-06-11 NOTE — Progress Notes (Signed)
Physical Therapy Treatment Patient Details Name: Tiphanie Vo MRN: [id number] DOB: [birth year] Today's Date: 06/11/2013    History of Present Illness Pt. 68 y.o Female admitted for R posterior THA    PT Comments    Patient progressing slowly but steadily with mobility.  Patient able to ambulate further today and with less assistance, however, still required increased assistance for bed mobility and increased monitoring during session.  Continue to feel patient would be safest to go to SNF for short-term rehab before returning home.   Patient and I discussed this today.   Follow Up Recommendations  SNF     Equipment Recommendations  None recommended by PT;Other (comment)    Recommendations for Other Services       Precautions / Restrictions Precautions Precautions: Fall;Posterior Hip Precaution Booklet Issued: Yes (comment) Restrictions RLE Weight Bearing: Weight bearing as tolerated    Mobility  Bed Mobility Overal bed mobility: Needs Assistance Bed Mobility: Supine to Sit     Supine to sit: Mod assist     General bed mobility comments: required assist to manage LE's and to raise shoulders off bed.  Transfers Overall transfer level: Needs assistance Equipment used: Rolling walker (2 wheeled) Transfers: Sit to/from Stand Sit to Stand: Min assist         General transfer comment: verbal cues for hand placement  Ambulation/Gait Ambulation/Gait assistance: Min assist Ambulation Distance (Feet): 10 Feet (x2) Assistive device: Rolling walker (2 wheeled) Gait Pattern/deviations: Step-to pattern;Decreased stride length Gait velocity: extremely decreased Gait velocity interpretation: <1.8 ft/sec, indicative of risk for recurrent falls General Gait Details: Patient moving very slowly but steadily during gait.  Patient did get SOB during gait and requested to sit down.     Stairs            Wheelchair Mobility    Modified Rankin (Stroke Patients Only)       Balance Overall balance assessment: Needs assistance Sitting-balance support: No upper extremity supported Sitting balance-Leahy Scale: Fair     Standing balance support: Bilateral upper extremity supported Standing balance-Leahy Scale: Poor                      Cognition Arousal/Alertness: Awake/alert Behavior During Therapy: WFL for tasks assessed/performed Overall Cognitive Status: Within Functional Limits for tasks assessed                      Exercises      General Comments        Pertinent Vitals/Pain Patient reports pain 10/10 in hip during gait and once in chair.  Patient was premedicated prior to session and monitored throughout session.  RN notified and will discuss further medication options with patient.    Home Living                      Prior Function            PT Goals (current goals can now be found in the care plan section) Progress towards PT goals: Progressing toward goals    Frequency  7X/week    PT Plan Current plan remains appropriate    Co-evaluation             End of Session Equipment Utilized During Treatment: Gait belt Activity Tolerance: Patient limited by pain Patient left: in chair;with call bell/phone within reach;with family/visitor present     Time: 1027-2536 PT Time Calculation (min): 31 min  Charges:  $Gait Training:  8-22 mins $Therapeutic Exercise: 8-22 mins                    G Codes:      Shanna Cisco, [place] 06/11/2013, 9:40 AM

## 2013-06-11 NOTE — Progress Notes (Signed)
Clinical Social Work Department BRIEF PSYCHOSOCIAL ASSESSMENT 06/11/2013  Patient:  [name] [name], [name] [name]     Account Number:  [id number]     Admit date:  06/09/2013  Clinical Social Worker:  Rolinda Roan  Date/Time:  06/11/2013 06:03 PM  Referred by:  Physician  Date Referred:  06/10/2013 Referred for  SNF Placement   Other Referral:   Interview type:  Patient Other interview type:    PSYCHOSOCIAL DATA Living Status:  HUSBAND Admitted from facility:   Level of care:   Primary support name:  [name] [name] Primary support relationship to patient:  SPOUSE Degree of support available:   Good support at bedside.    CURRENT CONCERNS  Other Concerns:    SOCIAL WORK ASSESSMENT / PLAN Clinical Social Worker (CSW) met with patient and husband at bedside. Patient reported that she wanted to go to a skilled nursing facility and she did not feel comfortable going home. Husband agreered with patient. Patient is agreeable to SNF search in [facility] and does not have a preference of facilities at this point.   Assessment/plan status:  Psychosocial Support/Ongoing Assessment of Needs Other assessment/ plan:   Information/referral to community resources:   CSW gave patient SNF list.    PATIENT'S/FAMILY'S RESPONSE TO PLAN OF CARE: Patient and husband thanked CSW for visit and starting placement process.

## 2013-06-12 ENCOUNTER — Encounter (HOSPITAL_COMMUNITY): Payer: Self-pay | Admitting: Orthopedic Surgery

## 2013-06-12 LAB — CBC
HCT: 30.6 % — ABNORMAL LOW (ref 36.0–46.0)
Hemoglobin: 10.1 g/dL — ABNORMAL LOW (ref 12.0–15.0)
MCH: 30.2 pg (ref 26.0–34.0)
MCHC: 33 g/dL (ref 30.0–36.0)
MCV: 91.6 fL (ref 78.0–100.0)
Platelets: 178 10*3/uL (ref 150–400)
RBC: 3.34 MIL/uL — ABNORMAL LOW (ref 3.87–5.11)
RDW: 13.2 % (ref 11.5–15.5)
WBC: 9 10*3/uL (ref 4.0–10.5)

## 2013-06-12 LAB — GLUCOSE, CAPILLARY
Glucose-Capillary: 103 mg/dL — ABNORMAL HIGH (ref 70–99)
Glucose-Capillary: 113 mg/dL — ABNORMAL HIGH (ref 70–99)
Glucose-Capillary: 115 mg/dL — ABNORMAL HIGH (ref 70–99)
Glucose-Capillary: 108 mg/dL — ABNORMAL HIGH (ref 70–99)

## 2013-06-12 NOTE — Progress Notes (Signed)
PATIENT ID: [name] [name]  MRN: [id number]  DOB/AGE:  11-21-1945 / 68 y.o.  3 Days Post-Op Procedure(s) (LRB): TOTAL HIP ARTHROPLASTY (Right)    PROGRESS NOTE Subjective: Patient is alert, oriented,no Nausea, no Vomiting, yes passing gas, no Bowel Movement. Taking PO well. Denies SOB, Chest or Calf Pain. Using Incentive Spirometer, PAS in place. Ambulate WBAT Patient reports pain as 8 on 0-10 scale  .    Objective: Vital signs in last 24 hours: Filed Vitals:   06/11/13 1200 06/11/13 1246 06/11/13 2214 06/12/13 0707  BP:  128/54 141/64 109/57  Pulse:  103 95 95  Temp:  98.3 F (36.8 C) 99.6 F (37.6 C) 99.7 F (37.6 C)  TempSrc:  Oral Oral Oral  Resp: 16 18 18 18   Height:      Weight:      SpO2:  97% 99% 99%      Intake/Output from previous day: I/O last 3 completed shifts: In: 7062 [P.O.:840; I.V.:200] Out: 600 [Urine:600]   Intake/Output this shift: Total I/O In: 240 [P.O.:240] Out: -    LABORATORY DATA:  Recent Labs  06/10/13 0458  06/11/13 0430  06/11/13 1145 06/11/13 1645 06/11/13 2217  WBC 11.0*  --  9.8  --   --   --   --   HGB 11.8*  --  11.4*  --   --   --   --   HCT 36.3  --  34.9*  --   --   --   --   PLT 193  --  163  --   --   --   --   NA 136*  --   --   --   --   --   --   K 4.7  --   --   --   --   --   --   CL 99  --   --   --   --   --   --   CO2 24  --   --   --   --   --   --   BUN 8  --   --   --   --   --   --   CREATININE 1.04  --   --   --   --   --   --   GLUCOSE 148*  --   --   --   --   --   --   GLUCAP  --   < >  --   < > 146* 150* 127*  CALCIUM 9.1  --   --   --   --   --   --   < > = values in this interval not displayed.  Examination: Neurologically intact ABD soft Neurovascular intact Sensation intact distally Intact pulses distally Dorsiflexion/Plantar flexion intact Incision: dressing C/D/I No cellulitis present Compartment soft} XR AP&Lat of hip shows well placed\fixed THA  Assessment:   3 Days Post-Op  Procedure(s) (LRB): TOTAL HIP ARTHROPLASTY (Right) ADDITIONAL DIAGNOSIS: Diabetes and Hypertension, IBS, Bipolar 1 disorder, fibromyalgia, depression/anxiety   Plan: PT/OT WBAT, THA  posterior precautions  DVT Prophylaxis: SCDx72 hrs, ASA 325 mg BID x 2 weeks  DISCHARGE PLAN: Skilled Nursing Facility/Rehab when bed available  DISCHARGE NEEDS: HHPT, HHRN, Walker and 3-in-1 comode seat
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
## 2013-06-12 NOTE — Progress Notes (Signed)
Physical Therapy Treatment Patient Details Name: [name] [name] MRN: [id number] DOB: [birth year] Today's Date: 06/12/2013    History of Present Illness Pt. 68 y.o Female admitted for R posterior THA    PT Comments    Pt is progressing slowly towards physical therapy goals. She is ambulating up to 30 feet before needing to rest due to fatigue/pain. Pt quite lethargic today only able to recall 2/3 hip precautions and needing frequent verbal cues for safe ambulation. Will continue to follow patient to work on independence with functional mobility.  Follow Up Recommendations  SNF     Equipment Recommendations  None recommended by PT;Other (comment)    Recommendations for Other Services OT consult     Precautions / Restrictions Precautions Precautions: Fall;Posterior Hip Precaution Comments: able to state 2/3 hip precautions. Reviewed 3/3  Restrictions Weight Bearing Restrictions: Yes RLE Weight Bearing: Weight bearing as tolerated    Mobility  Bed Mobility Overal bed mobility:  (pt up in chair)                Transfers Overall transfer level: Needs assistance Equipment used: Rolling walker (2 wheeled) Transfers: Sit to/from Stand Sit to Stand: Min guard Stand pivot transfers: Min assist       General transfer comment: Verbal cues for hand placement, no physical assist required. Pt needs extra time and leans heavily over RW  Ambulation/Gait Ambulation/Gait assistance: Min guard Ambulation Distance (Feet): 30 Feet Assistive device: Rolling walker (2 wheeled) Gait Pattern/deviations: Step-to pattern;Step-through pattern;Decreased step length - right;Decreased step length - left;Decreased stance time - right;Trunk flexed Gait velocity: extremely decreased   General Gait Details: Pt with very slow gait pattern. Needs verbal cues for sequencing and upright posture. Focused on increasing stance time on RLE with larger L step, demonstrating intermittent step-through  gait pattern.   Stairs            Wheelchair Mobility    Modified Rankin (Stroke Patients Only)       Balance                                    Cognition Arousal/Alertness: Lethargic;Suspect due to medications Behavior During Therapy: [name] [name] for tasks assessed/performed Overall Cognitive Status: Impaired/Different from baseline Area of Impairment: Memory     Memory: Decreased recall of precautions;Decreased short-term memory              Exercises      General Comments        Pertinent Vitals/Pain 5/10 pain  Nurse aware and reports recent administration of pain medication Pt repositioned in chair for comfort    Home Living                      Prior Function            PT Goals (current goals can now be found in the care plan section) Acute Rehab PT Goals Patient Stated Goal: Rehab to increase strength before going home PT Goal Formulation: With patient Time For Goal Achievement: 06/16/13 Potential to Achieve Goals: Good Progress towards PT goals: Progressing toward goals    Frequency  7X/week    PT Plan Current plan remains appropriate    Co-evaluation             End of Session Equipment Utilized During Treatment: Gait belt Activity Tolerance: Patient limited by lethargy Patient left: in chair;with call bell/phone within  reach;with family/visitor present     Time: 2947-6546 PT Time Calculation (min): 18 min  Charges:  $Gait Training: 8-22 mins                    G Codes:      [name] [name], [name] [name]   [name] [name] 06/12/2013, 1:32 PM

## 2013-06-12 NOTE — Progress Notes (Signed)
Physical Therapy Treatment Patient Details Name: [name] [name] MRN: [id number] DOB: [birth year] Today's Date: 06/12/2013    History of Present Illness Pt. 68 y.o Female admitted for R posterior THA    PT Comments    Pt unwilling to ambulate with therapy this afternoon secondary to fatigue and lethargy. She was able to perform exercises for strength and ROM maintenance. Decreased recall and is inconsistent with verbalizing posterior hip precautions; PT demonstrated and spent time explaining reason behind certain restricted motions. Pt will benefit from continued skilled PT services to improve independence with functional mobility.  Follow Up Recommendations  SNF     Equipment Recommendations  None recommended by PT    Recommendations for Other Services OT consult     Precautions / Restrictions Precautions Precautions: Fall;Posterior Hip Precaution Comments: able to state 1/3 hip precautions. Reviewed 3/3  Restrictions Weight Bearing Restrictions: Yes RLE Weight Bearing: Weight bearing as tolerated    Mobility  Bed Mobility                  Transfers Overall transfer level: Needs assistance Equipment used: Rolling walker (2 wheeled) Transfers: Sit to/from Stand Sit to Stand: Min guard         General transfer comment: Verbal cues for hand placement, no physical assist required. Pt needs extra time and leans heavily over RW  Ambulation/Gait Ambulation/Gait assistance: Min guard Ambulation Distance (Feet): 30 Feet Assistive device: Rolling walker (2 wheeled) Gait Pattern/deviations: Step-to pattern;Step-through pattern;Decreased step length - right;Decreased step length - left;Decreased stance time - right;Trunk flexed Gait velocity: extremely decreased   General Gait Details: Pt with very slow gait pattern. Needs verbal cues for sequencing and upright posture. Focused on increasing stance time on RLE with larger L step, demonstrating intermittent step-through  gait pattern.   Stairs            Wheelchair Mobility    Modified Rankin (Stroke Patients Only)       Balance                                    Cognition Arousal/Alertness: Lethargic;Suspect due to medications Behavior During Therapy: [facility] for tasks assessed/performed Overall Cognitive Status: Impaired/Different from baseline Area of Impairment: Memory     Memory: Decreased recall of precautions              Exercises Total Joint Exercises Ankle Circles/Pumps: AROM;Both;10 reps;Seated Gluteal Sets: AROM;Both;10 reps;Seated Hip ABduction/ADduction: AAROM;Right;10 reps;Supine (in recliner (supine)) Straight Leg Raises: AAROM;Right;10 reps;Supine (in recliner (supine))    General Comments General comments (skin integrity, edema, etc.): Reviewed precautions with pt and husband. Pt inconsistent when verbalizing posterior hip precautions. Educated on importance of compliance with exercises.      Pertinent Vitals/Pain 8/10 pain Nurse aware Pt repositioned in chair for comfort.    Home Living                      Prior Function            PT Goals (current goals can now be found in the care plan section) Acute Rehab PT Goals PT Goal Formulation: With patient Time For Goal Achievement: 06/16/13 Potential to Achieve Goals: Good Progress towards PT goals: Progressing toward goals    Frequency  7X/week    PT Plan Current plan remains appropriate    Co-evaluation  End of Session Equipment Utilized During Treatment: Gait belt Activity Tolerance: Patient limited by lethargy Patient left: in chair;with call bell/phone within reach;with family/visitor present     Time: 1550-1602 PT Time Calculation (min): 12 min  Charges:  $Gait Training: 8-22 mins $Therapeutic Exercise: 8-22 mins                    G Codes:      [employer], [place]  [name] 06/12/2013, 4:53 PM

## 2013-06-12 NOTE — Progress Notes (Signed)
[facility] has yet to receive Auth from [place]. MD and PA informed.  Rhea Pink, MSW, [place]

## 2013-06-12 NOTE — Progress Notes (Signed)
Occupational Therapy Treatment Patient Details Name: [name] [name] MRN: [id number] DOB: June 05, 1945 Today's Date: 06/12/2013    History of present illness Pt. 68 y.o Female admitted for R posterior THA   OT comments  Pt continues to have some confusion, poor memory and inability recall her hip precautions.  Instructed pt and husband in hip precautions related to ADL and demonstrated/practiced use of AE. Transfers to [facility] are improved, will continue goal for consistency. Plan for rehab in SNF remains appropriate.  Follow Up Recommendations  SNF;Supervision/Assistance - 24 hour    Equipment Recommendations  None recommended by OT    Recommendations for Other Services      Precautions / Restrictions Precautions Precautions: Fall;Posterior Hip Precaution Comments: Pt able to state 0/3 hip precautions. Reviewed 3/3 hip precautions with pt and husband Restrictions RLE Weight Bearing: Weight bearing as tolerated       Mobility Bed Mobility Overal bed mobility:  (pt up in chair)                Transfers Overall transfer level: Needs assistance Equipment used: Rolling walker (2 wheeled) Transfers: Sit to/from Stand Sit to Stand: Min assist Stand pivot transfers: Min assist       General transfer comment: verbal cues for hand placement    Balance                                   ADL Overall ADL's : Needs assistance/impaired     Grooming: Set up;Sitting;Wash/dry hands;Wash/dry face;Brushing hair       Lower Body Bathing: Moderate assistance;Cueing for back precautions;With adaptive equipment;Sit to/from stand       Lower Body Dressing: Moderate assistance;Adhering to hip precautions;With adaptive equipment;Sit to/from stand   Toilet Transfer: Minimal assistance;BSC;RW   Toileting- Clothing Manipulation and Hygiene: Supervision/safety;Sitting/lateral lean       Functional mobility during ADLs: Minimal assistance;Cueing for sequencing;Rolling  walker General ADL Comments: Pt is alert, but continues to have poor memory and confusion. Educated in post hip precautions related to LB ADL. Instructed in use of AE and use of 3 in1 over toilet. Husband given handout with gift shop information.      Vision                     Perception     Praxis      Cognition   Behavior During Therapy: WFL for tasks assessed/performed Overall Cognitive Status: Impaired/Different from baseline Area of Impairment: Memory     Memory: Decreased recall of precautions;Decreased short-term memory               Extremity/Trunk Assessment               Exercises     Shoulder Instructions       General Comments      Pertinent Vitals/ Pain       R hip, did not rate, premedicated, repositioned  Home Living                                          Prior Functioning/Environment              Frequency Min 2X/week     Progress Toward Goals  OT Goals(current goals can now be found in the care plan section)  Progress towards  OT goals: Progressing toward goals  Acute Rehab OT Goals Patient Stated Goal: Rehab to increase strength before going home  Plan Discharge plan remains appropriate    Co-evaluation                 End of Session     Activity Tolerance Patient tolerated treatment well   Patient Left in chair;with call bell/phone within reach;with family/visitor present   Nurse Communication          Time: 6761-9509 OT Time Calculation (min): 33 min  Charges: OT General Charges $OT Visit: 1 Procedure OT Treatments $Self Care/Home Management : 23-37 mins  Malka So 06/12/2013, 12:56 PM [phone]

## 2013-06-13 ENCOUNTER — Emergency Department (HOSPITAL_COMMUNITY)
Admission: EM | Admit: 2013-06-13 | Discharge: 2013-06-14 | Disposition: A | Discharge status: Home / Self Care | Payer: BC Managed Care – PPO | Attending: Emergency Medicine | Admitting: Emergency Medicine

## 2013-06-13 ENCOUNTER — Inpatient Hospital Stay (HOSPITAL_COMMUNITY)
Admission: RE | Admit: 2013-06-13 | Payer: BC Managed Care – PPO | Source: Ambulatory Visit | Admitting: Orthopedic Surgery

## 2013-06-13 ENCOUNTER — Encounter (HOSPITAL_COMMUNITY): Payer: Self-pay | Admitting: Emergency Medicine

## 2013-06-13 ENCOUNTER — Encounter (HOSPITAL_COMMUNITY): Admission: RE | Payer: Self-pay | Source: Ambulatory Visit

## 2013-06-13 DIAGNOSIS — G8929 Other chronic pain: Secondary | ICD-10-CM | POA: Insufficient documentation

## 2013-06-13 DIAGNOSIS — M1610 Unilateral primary osteoarthritis, unspecified hip: Secondary | ICD-10-CM | POA: Insufficient documentation

## 2013-06-13 DIAGNOSIS — M169 Osteoarthritis of hip, unspecified: Secondary | ICD-10-CM | POA: Insufficient documentation

## 2013-06-13 DIAGNOSIS — Z8701 Personal history of pneumonia (recurrent): Secondary | ICD-10-CM | POA: Insufficient documentation

## 2013-06-13 DIAGNOSIS — M25559 Pain in unspecified hip: Secondary | ICD-10-CM | POA: Insufficient documentation

## 2013-06-13 DIAGNOSIS — E785 Hyperlipidemia, unspecified: Secondary | ICD-10-CM | POA: Insufficient documentation

## 2013-06-13 DIAGNOSIS — Z79899 Other long term (current) drug therapy: Secondary | ICD-10-CM | POA: Insufficient documentation

## 2013-06-13 DIAGNOSIS — M47812 Spondylosis without myelopathy or radiculopathy, cervical region: Secondary | ICD-10-CM | POA: Insufficient documentation

## 2013-06-13 DIAGNOSIS — Z8709 Personal history of other diseases of the respiratory system: Secondary | ICD-10-CM | POA: Insufficient documentation

## 2013-06-13 DIAGNOSIS — Z8719 Personal history of other diseases of the digestive system: Secondary | ICD-10-CM | POA: Insufficient documentation

## 2013-06-13 DIAGNOSIS — E119 Type 2 diabetes mellitus without complications: Secondary | ICD-10-CM | POA: Insufficient documentation

## 2013-06-13 DIAGNOSIS — F411 Generalized anxiety disorder: Secondary | ICD-10-CM | POA: Insufficient documentation

## 2013-06-13 DIAGNOSIS — G8918 Other acute postprocedural pain: Secondary | ICD-10-CM | POA: Insufficient documentation

## 2013-06-13 DIAGNOSIS — IMO0001 Reserved for inherently not codable concepts without codable children: Secondary | ICD-10-CM | POA: Insufficient documentation

## 2013-06-13 DIAGNOSIS — F319 Bipolar disorder, unspecified: Secondary | ICD-10-CM | POA: Insufficient documentation

## 2013-06-13 DIAGNOSIS — Z8742 Personal history of other diseases of the female genital tract: Secondary | ICD-10-CM | POA: Insufficient documentation

## 2013-06-13 DIAGNOSIS — M19049 Primary osteoarthritis, unspecified hand: Secondary | ICD-10-CM | POA: Insufficient documentation

## 2013-06-13 DIAGNOSIS — G2581 Restless legs syndrome: Secondary | ICD-10-CM | POA: Insufficient documentation

## 2013-06-13 LAB — GLUCOSE, CAPILLARY
Glucose-Capillary: 109 mg/dL — ABNORMAL HIGH (ref 70–99)
Glucose-Capillary: 99 mg/dL (ref 70–99)

## 2013-06-13 SURGERY — ARTHROPLASTY, HIP, TOTAL, ANTERIOR APPROACH
Anesthesia: General | Laterality: Right

## 2013-06-13 NOTE — ED Notes (Signed)
PA at bedside.

## 2013-06-13 NOTE — Progress Notes (Signed)
Physical Therapy Treatment Patient Details Name: [name] [name] MRN: [id number] DOB: 09/15/1945 Today's Date: 06/13/2013    History of Present Illness Pt. 68 y.o Female admitted for R posterior THA    PT Comments    Pt continues to progress towards PT goals. She ambulates up to 65 feet with supervision. Pt still requires cues for posterior hip precautions, recalling only 2/3. Pt will benefit from continued skilled PT in SNF to focus on improving independence with functional mobility.  Follow Up Recommendations  SNF     Equipment Recommendations  None recommended by PT    Recommendations for Other Services       Precautions / Restrictions Precautions Precautions: Fall;Posterior Hip Precaution Comments: able to state 2/3 hip precautions. Reviewed 3/3  Restrictions Weight Bearing Restrictions: Yes RLE Weight Bearing: Weight bearing as tolerated    Mobility  Bed Mobility                  Transfers Overall transfer level: Needs assistance Equipment used: Rolling walker (2 wheeled) Transfers: Sit to/from Stand Sit to Stand: Min guard         General transfer comment: Min guard for safety. Requires extra time.  Ambulation/Gait Ambulation/Gait assistance: Supervision Ambulation Distance (Feet): 65 Feet Assistive device: Rolling walker (2 wheeled) Gait Pattern/deviations: Step-to pattern;Step-through pattern;Decreased step length - left;Decreased stance time - right;Antalgic Gait velocity: decreased   General Gait Details: Pt was ambulating with supervision from husband in room when PT entered, stating they were on their way back from the bathroom. Pt continues to move at a slow, cautious pace. Better with sequencing, still requires verbal cues for turns to ensure following precautions to prevent internal rotation   Stairs            Wheelchair Mobility    Modified Rankin (Stroke Patients Only)       Balance                                     Cognition Arousal/Alertness: Lethargic;Suspect due to medications Behavior During Therapy: [facility] for tasks assessed/performed Overall Cognitive Status: Impaired/Different from baseline Area of Impairment: Memory     Memory: Decreased recall of precautions;Decreased short-term memory              Exercises Total Joint Exercises Ankle Circles/Pumps: AROM;Both;10 reps;Seated Long Arc Quad: Right;10 reps;AAROM;Seated    General Comments General comments (skin integrity, edema, etc.): Pt complains of ankle pain near right achillies that radiates from her calf to anterior knee. Nurse notified. No redness or tenderness with palpation. Feels better in dependent position vs elevated.      Pertinent Vitals/Pain Pt does not give a numerical value for pain Complains of L ankle pain radiating to anterior knee. Unable to reproduce pain. Non tender to palpation. No redness/swelling Nurse notified Pt repositioned for comfort in chair.    Home Living                      Prior Function            PT Goals (current goals can now be found in the care plan section) Acute Rehab PT Goals PT Goal Formulation: With patient Time For Goal Achievement: 06/16/13 Potential to Achieve Goals: Good Progress towards PT goals: Progressing toward goals    Frequency  7X/week    PT Plan Current plan remains appropriate    Co-evaluation  End of Session Equipment Utilized During Treatment: Gait belt Activity Tolerance: Patient tolerated treatment well Patient left: in chair;with call bell/phone within reach;with family/visitor present     Time: 1430-1455 PT Time Calculation (min): 25 min  Charges:  $Gait Training: 8-22 mins $Therapeutic Exercise: 8-22 mins                    G Codes:      [employer], [name]  [name] [name] 06/13/2013, 3:06 PM

## 2013-06-13 NOTE — Progress Notes (Signed)
IV out. ems transport to [facility]. NO pain/discomfort.

## 2013-06-13 NOTE — ED Provider Notes (Signed)
CSN: [id number]     Arrival date & time 06/13/13  2209 History   First MD Initiated Contact with Patient 06/13/13 2240     Chief Complaint  Patient presents with  . Hip Pain     (Consider location/radiation/quality/duration/timing/severity/associated sxs/prior Treatment) HPI Comments: Patient presents to the emergency department with chief complaint of right hip pain. She states that she had right hip replacement on Friday. She was just discharged from the hospital this morning. She was discharged to a skilled nursing facility. She states that she became very frustrated while at skilled nursing facility, because they did not have any of her medications. He insisted that they bring her back to the hospital. She states that she's still having 8/10 pain. She's tried taking Percocet with no relief. He was performed by Dr. Mayer Camel.  The history is provided by the patient. No language interpreter was used.    Past Medical History  Diagnosis Date  . Bilateral hearing loss   . Urinary, incontinence, stress female   . Dyslipidemia     takes Crestor daily  . Osteoarthritis cervical spine   . Osteoarthritis of hand     bilateral  . Colitis, ischemic 2012  . Sciatica   . Alcohol abuse   . Hepatic steatosis 06/18/12    severe  . Anxiety     takes Valium daily as needed and Ativan daily  . Diabetes mellitus without complication     takes Janumet daily  . Walking pneumonia     last time more than 82yrs ago  . History of bronchitis     last time at least 75yrs ago  . Confusion     r/t meds  . Numbness     in right foot  . Fibromyalgia   . Lupus     tumid-skin  . Joint pain   . Joint swelling   . Chronic back pain     DDD  . Osteoarthritis     in hips  . Constipation   . IBS (irritable bowel syndrome)   . IBS (irritable bowel syndrome)   . Diverticulosis   . Depression     takes Prozac daily and Bupspirone   . Restless leg syndrome     takes Requip nightly  . Urinary frequency    . Urinary urgency   . Urinary leakage   . Bipolar 1 disorder     takes Lithium nightly and Synthroid daily   Past Surgical History  Procedure Laterality Date  . D&c x 4    . Abdominal hysterectomy    . Tonsillectomy    . Excision of tumors at right neck      angle of jaw '68, benign  . Flexible sigmoidoscopy N/A 06/21/2012    Procedure: FLEXIBLE SIGMOIDOSCOPY;  Surgeon: Jerene Bears, MD;  Location: [facility];  Service: Gastroenterology;  Laterality: N/A;  . Appendectomy    . Tubal ligation    . Colonoscopy    . Esophagogastroduodenoscopy    . Total hip arthroplasty Right 06/09/2013    Procedure: TOTAL HIP ARTHROPLASTY;  Surgeon: Kerin Salen, MD;  Location: [facility];  Service: Orthopedics;  Laterality: Right;   Family History  Problem Relation Age of Onset  . Diabetes Brother   . Alcohol abuse Brother     x 2  . Drug abuse Mother   . Alcohol abuse Mother   . Alcohol abuse Father   . Colon cancer Paternal Uncle    History  Substance Use  Topics  . Smoking status: Never Smoker   . Smokeless tobacco: Never Used  . Alcohol Use: No   OB History   Grav Para Term Preterm Abortions TAB SAB Ect Mult Living                 Review of Systems  Constitutional: Negative for fever and chills.  Respiratory: Negative for shortness of breath.   Cardiovascular: Negative for chest pain.  Gastrointestinal: Negative for nausea, vomiting, diarrhea and constipation.  Genitourinary: Negative for dysuria.  Musculoskeletal: Positive for arthralgias.      Allergies  Codeine; Macrolides and ketolides; Procaine hcl; Neurontin; and Sulfa antibiotics  Home Medications   Current Outpatient Rx  Name  Route  Sig  Dispense  Refill  . acetaminophen (TYLENOL) 500 MG tablet   Oral   Take 500 mg by mouth every 6 (six) hours as needed for moderate pain.         . Acetylcysteine (NAC) 600 MG CAPS   Oral   Take 600 mg by mouth at bedtime.         . busPIRone (BUSPAR) 5 MG tablet   Oral    Take 5 mg by mouth 3 (three) times daily.          . ferrous sulfate 325 (65 FE) MG tablet   Oral   Take 325 mg by mouth at bedtime.          Marland Kitchen FLUoxetine (PROZAC) 20 MG capsule   Oral   Take 20 mg by mouth every morning.          Marland Kitchen levothyroxine (LEVOTHROID) 75 MCG tablet   Oral   Take 75 mcg by mouth daily before breakfast.          . lithium 300 MG tablet   Oral   Take 750 mg by mouth at bedtime. 2 1/2 tablets         . LORazepam (ATIVAN) 2 MG tablet   Oral   Take 2 mg by mouth 3 (three) times daily. scheduled         . methocarbamol (ROBAXIN) 500 MG tablet   Oral   Take 1 tablet (500 mg total) by mouth 2 (two) times daily with a meal.   60 tablet   0   . oxyCODONE-acetaminophen (ROXICET) 5-325 MG per tablet   Oral   Take 1 tablet by mouth every 4 (four) hours as needed.   60 tablet   0   . QUEtiapine (SEROQUEL) 200 MG tablet   Oral   Take 300 mg by mouth at bedtime. 1 1/2 tablets         . rOPINIRole (REQUIP) 0.5 MG tablet   Oral   Take 1-2 mg by mouth at bedtime. For restless legs (dose is based on severity)         . rosuvastatin (CRESTOR) 10 MG tablet   Oral   Take 10 mg by mouth at bedtime.         . SitaGLIPtin-MetFORMIN HCl (JANUMET XR) [phone] MG TB24   Oral   Take 1 tablet by mouth at bedtime.         Marland Kitchen omega-3 acid ethyl esters (LOVAZA) 1 G capsule   Oral   Take 1 g by mouth daily.          BP 121/42  Pulse 87  Temp(Src) 99.2 F (37.3 C) (Oral)  Resp 16  SpO2 96% Physical Exam  Nursing note and vitals reviewed. Constitutional:  She is oriented to person, place, and time. She appears well-developed and well-nourished.  HENT:  Head: Normocephalic and atraumatic.  Eyes: Conjunctivae and EOM are normal. Pupils are equal, round, and reactive to light.  Neck: Normal range of motion. Neck supple.  Cardiovascular: Normal rate and regular rhythm.  Exam reveals no gallop and no friction rub.   No murmur  heard. Pulmonary/Chest: Effort normal and breath sounds normal. No respiratory distress. She has no wheezes. She has no rales. She exhibits no tenderness.  Abdominal: Soft. Bowel sounds are normal. She exhibits no distension and no mass. There is no tenderness. There is no rebound and no guarding.  Musculoskeletal: Normal range of motion. She exhibits no edema and no tenderness.  Right hip range of motion and strength deferred  Neurological: She is alert and oriented to person, place, and time.  Skin: Skin is warm and dry.  Surgical incision of her right hip, appears to be well-healing, no surrounding erythema, no discharge, no warmth  Psychiatric: She has a normal mood and affect. Her behavior is normal. Judgment and thought content normal.    ED Course  Procedures (including critical care time) Labs Review Labs Reviewed - No data to display Imaging Review No results found.   EKG Interpretation None      MDM   Final diagnoses:  Hip pain    Patient with recent hip surgery.  Now with persistent pain, and does not want to stay at skilled nursing facility.  Patient discussed with Dr. Roxanne Mins.  Will move to pod C and await case management evaluation in the morning.  See scanned record from [facility] down time.    Montine Circle, PA-C 06/14/13 1514

## 2013-06-13 NOTE — ED Notes (Signed)
Pt requesting pain medication.  

## 2013-06-13 NOTE — Progress Notes (Signed)
Patient ID: [name] [name], female   DOB: 1945-06-08, 68 y.o.   MRN: [id number] PATIENT ID: [name] [name]  MRN: [id number]  DOB/AGE:  02-16-1946 / 68 y.o.  4 Days Post-Op Procedure(s) (LRB): TOTAL HIP ARTHROPLASTY (Right)    PROGRESS NOTE Subjective: Patient is alert, oriented, no Nausea, no Vomiting, yes passing gas, no Bowel Movement. Taking PO well. Does not report feeling constipated Denies SOB, Chest or Calf Pain. Using Incentive Spirometer, PAS in place. Ambulate weight bearing as tolerated but feels tired and fatigued., CPM 0-70 Patient reports pain as 10 on 0-10 scale, patient has been high doses of chronic pain medications prior to surgery and understands there'll be difficulty controlling her pain because of this.    Objective: Vital signs in last 24 hours: Filed Vitals:   06/12/13 2005 06/13/13 0000 06/13/13 0400 06/13/13 0434  BP: 119/53   125/61  Pulse: 91   110  Temp: 98.3 F (36.8 C)   98.5 F (36.9 C)  TempSrc: Oral   Oral  Resp: 18 18 18 18   Height:      Weight:      SpO2: 98% 98% 98% 97%      Intake/Output from previous day: I/O last 3 completed shifts: In: 1200 [P.O.:1200] Out: -    Intake/Output this shift:     LABORATORY DATA:  Recent Labs  06/11/13 0430  06/12/13 0755  06/12/13 1615 06/12/13 2151 06/13/13 0640  WBC 9.8  --  9.0  --   --   --   --   HGB 11.4*  --  10.1*  --   --   --   --   HCT 34.9*  --  30.6*  --   --   --   --   PLT 163  --  178  --   --   --   --   GLUCAP  --   < >  --   < > 115* 108* 109*  < > = values in this interval not displayed.  Examination: Neurologically intact ABD soft Neurovascular intact Sensation intact distally Intact pulses distally Dorsiflexion/Plantar flexion intact Incision: no drainage No cellulitis present Compartment soft}  Assessment:   4 Days Post-Op Procedure(s) (LRB): TOTAL HIP ARTHROPLASTY (Right) ADDITIONAL DIAGNOSIS: Chronic pain management, low back pain, fibromyalgia, history of  stress incontinence, history of alcohol abuse in remission, history of anxiety  Plan: PT/OT WBAT, CPM 5/hrs day until ROM 0-90 degrees, then D/C CPM DVT Prophylaxis:  SCDx72hrs, ASA 325 mg BID x 2 weeks, will discontinue use of IV pain medicines at this time DISCHARGE PLAN: Skilled Nursing Facility/Rehab, waiting on approval from [facility] the patient will probably go to -- in place when this was accomplished. DISCHARGE NEEDS: HHPT, HHRN, CPM and Dutch Quint 06/13/2013, 7:48 AM

## 2013-06-13 NOTE — Progress Notes (Signed)
Clinical social worker assisted with patient discharge to skilled nursing facility, [facility].  CSW addressed all family questions and concerns. CSW copied chart and added all important documents. CSW also set up patient transportation with [facility]. Clinical Social Worker will sign off for now as social work intervention is no longer needed.  Elisabella Hacker, MSW, LCSWA [phone] 

## 2013-06-13 NOTE — Progress Notes (Signed)
Physical Therapy Treatment Patient Details Name: [name] [name] MRN: [id number] DOB: 1945-08-31 Today's Date: 06/13/2013    History of Present Illness Pt. 68 y.o Female admitted for R posterior THA    PT Comments    Pt continues to progress towards physical therapy goals ambulating up to 50 feet with min guard for safety. She is still unable to correctly identify 3/3 posterior hip precautions and PT continues to address this issue with various methods for education. Pt will benefit from continued skilled PT services focusing on functional mobility to improve level of independence at next venue of care. Will continue to follow until d/c.  Follow Up Recommendations  SNF     Equipment Recommendations  None recommended by PT    Recommendations for Other Services       Precautions / Restrictions Precautions Precautions: Fall;Posterior Hip Precaution Comments: able to state 2/3 hip precautions. Reviewed 3/3  Restrictions Weight Bearing Restrictions: Yes RLE Weight Bearing: Weight bearing as tolerated    Mobility  Bed Mobility Overal bed mobility: Needs Assistance Bed Mobility: Supine to Sit     Supine to sit: Min guard     General bed mobility comments: Pt has progressed to Min guard with bed mobility for safety, verbal cues given for technique.  Transfers Overall transfer level: Needs assistance Equipment used: Rolling walker (2 wheeled) Transfers: Sit to/from Stand Sit to Stand: Min guard         General transfer comment: Min guard for safety with verbal cues for hand placement. Patient able to transfer safely from bed on lowest setting and bed side commode without physical assistance. Requires extra time.  Ambulation/Gait Ambulation/Gait assistance: Min guard Ambulation Distance (Feet): 50 Feet Assistive device: Rolling walker (2 wheeled) Gait Pattern/deviations: Step-to pattern;Step-through pattern;Antalgic;Decreased step length - left;Decreased stance time -  right;Trunk flexed Gait velocity: decreased   General Gait Details: Pt gait speed improved slightly today, following commands for step-through gait pattern intermittently. Verbal cues for upright posture and sequencing.   Stairs            Wheelchair Mobility    Modified Rankin (Stroke Patients Only)       Balance                                    Cognition Arousal/Alertness: Lethargic;Suspect due to medications Behavior During Therapy: [facility] for tasks assessed/performed Overall Cognitive Status: Impaired/Different from baseline Area of Impairment: Memory     Memory: Decreased recall of precautions;Decreased short-term memory              Exercises Total Joint Exercises Ankle Circles/Pumps: AROM;Both;10 reps;Seated    General Comments General comments (skin integrity, edema, etc.): Pt asked precautions twice during therapy session. Continuously forgets she is not to bend hip past 90 degrees (although she has not broken this precaution in my presence). PT demonstrated and reinforced precautions with patient again. Two visual handouts are available in room for pt to review throughout the day and she is made aware of these.      Pertinent Vitals/Pain 8/10 pain - pt states nursing has just given her pain medication prior to beginning PT session Pt repositioned in chair for comfort.     Home Living                      Prior Function  PT Goals (current goals can now be found in the care plan section) Acute Rehab PT Goals PT Goal Formulation: With patient Time For Goal Achievement: 06/16/13 Potential to Achieve Goals: Good Progress towards PT goals: Progressing toward goals    Frequency  7X/week    PT Plan Current plan remains appropriate    Co-evaluation             End of Session Equipment Utilized During Treatment: Gait belt Activity Tolerance: Patient tolerated treatment well Patient left: in chair;with call  bell/phone within reach     Time: 0844-0908 PT Time Calculation (min): 24 min  Charges:  $Gait Training: 8-22 mins $Self Care/Home Management: 8-22                    G Codes:      Elayne Snare, [place]  [name] 06/13/2013, 9:46 AM

## 2013-06-13 NOTE — ED Notes (Signed)
Pt is from [facility] rehabilitation center, pt reports she had a right hip replacement on Friday. Pt was discharged and transferred to [facility] today. Pt reports she is in a lot of pain and is not receiving post operative pain management care like she should be. EMS reports the pt's nurse offered to call the provider for the pt to have them order her something stronger for pain but the pt did not want to wait. Pt also states that [facility] did not have her nighttime medications on file to give to her, EMS reports the nurse offered to call the provider to ensure the pt was ordered the correct nighttime medications however the pt states she could not wait for her lithium or her DM medications. Pt states she needs to take them at a specific time and knows that we had them here because she was just here so she wanted to come here. Pt is A&O X4. Pt has a hx of DM and bipolar dx.

## 2013-06-14 ENCOUNTER — Encounter (HOSPITAL_COMMUNITY): Payer: Self-pay | Admitting: Emergency Medicine

## 2013-06-14 MED ORDER — QUETIAPINE FUMARATE 200 MG PO TABS
ORAL_TABLET | ORAL | Status: AC
  Filled 2013-06-14: qty 1

## 2013-06-14 MED ORDER — LINAGLIPTIN 5 MG PO TABS
5.0000 mg | ORAL_TABLET | Freq: Every day | ORAL | Status: DC
  Filled 2013-06-14: qty 1

## 2013-06-14 MED ORDER — FLUOXETINE HCL 20 MG PO CAPS
20.0000 mg | ORAL_CAPSULE | Freq: Every morning | ORAL | Status: DC
  Administered 2013-06-14: 20 mg via ORAL
  Filled 2013-06-14: qty 1

## 2013-06-14 MED ORDER — SITAGLIP PHOS-METFORMIN HCL ER 100-1000 MG PO TB24: 1.0000 | ORAL_TABLET | Freq: Every day | ORAL | Status: DC

## 2013-06-14 MED ORDER — BUSPIRONE HCL 10 MG PO TABS
5.0000 mg | ORAL_TABLET | Freq: Three times a day (TID) | ORAL | Status: DC
  Administered 2013-06-14: 5 mg via ORAL
  Filled 2013-06-14: qty 1

## 2013-06-14 MED ORDER — FERROUS SULFATE 325 (65 FE) MG PO TABS
325.0000 mg | ORAL_TABLET | Freq: Every day | ORAL | Status: DC
  Filled 2013-06-14: qty 1

## 2013-06-14 MED ORDER — ROPINIROLE HCL 0.5 MG PO TABS
0.5000 mg | ORAL_TABLET | Freq: Once | ORAL | Status: AC
  Administered 2013-06-14: 0.5 mg via ORAL
  Filled 2013-06-14: qty 1

## 2013-06-14 MED ORDER — ACETAMINOPHEN 325 MG PO TABS
650.0000 mg | ORAL_TABLET | Freq: Once | ORAL | Status: AC
  Administered 2013-06-14: 650 mg via ORAL
  Filled 2013-06-14: qty 2

## 2013-06-14 MED ORDER — QUETIAPINE FUMARATE 25 MG PO TABS: 300.0000 mg | ORAL_TABLET | Freq: Every day | ORAL | Status: DC

## 2013-06-14 MED ORDER — HYDROMORPHONE HCL PF 1 MG/ML IJ SOLN
1.0000 mg | Freq: Once | INTRAMUSCULAR | Status: AC
  Administered 2013-06-14: 1 mg via INTRAVENOUS
  Filled 2013-06-14: qty 1

## 2013-06-14 MED ORDER — METFORMIN HCL ER 500 MG PO TB24
1000.0000 mg | ORAL_TABLET | Freq: Every day | ORAL | Status: DC
  Filled 2013-06-14: qty 2

## 2013-06-14 MED ORDER — ATORVASTATIN CALCIUM 20 MG PO TABS
20.0000 mg | ORAL_TABLET | Freq: Every day | ORAL | Status: DC
  Filled 2013-06-14: qty 1

## 2013-06-14 MED ORDER — METHOCARBAMOL 500 MG PO TABS
500.0000 mg | ORAL_TABLET | Freq: Two times a day (BID) | ORAL | Status: DC
  Administered 2013-06-14: 500 mg via ORAL
  Filled 2013-06-14: qty 1

## 2013-06-14 MED ORDER — LITHIUM CARBONATE 150 MG PO CAPS
750.0000 mg | ORAL_CAPSULE | Freq: Every day | ORAL | Status: DC
  Filled 2013-06-14: qty 5

## 2013-06-14 MED ORDER — LEVOTHYROXINE SODIUM 75 MCG PO TABS
75.0000 ug | ORAL_TABLET | Freq: Every day | ORAL | Status: DC
  Administered 2013-06-14: 75 ug via ORAL
  Filled 2013-06-14 (×3): qty 1

## 2013-06-14 MED ORDER — ACETYLCYSTEINE 20 % IN SOLN
600.0000 mg | Freq: Every day | RESPIRATORY_TRACT | Status: DC
  Filled 2013-06-14: qty 4

## 2013-06-14 MED ORDER — BUSPIRONE HCL 10 MG PO TABS
ORAL_TABLET | ORAL | Status: AC
  Filled 2013-06-14: qty 1

## 2013-06-14 MED ORDER — OMEGA-3-ACID ETHYL ESTERS 1 G PO CAPS
1.0000 g | ORAL_CAPSULE | Freq: Every day | ORAL | Status: DC
  Administered 2013-06-14: 1 g via ORAL
  Filled 2013-06-14: qty 1

## 2013-06-14 MED ORDER — OXYCODONE-ACETAMINOPHEN 5-325 MG PO TABS
1.0000 | ORAL_TABLET | ORAL | Status: DC | PRN
  Administered 2013-06-14: 1 via ORAL
  Filled 2013-06-14: qty 1

## 2013-06-14 MED ORDER — LORAZEPAM 1 MG PO TABS
2.0000 mg | ORAL_TABLET | Freq: Three times a day (TID) | ORAL | Status: DC
  Administered 2013-06-14: 2 mg via ORAL
  Filled 2013-06-14: qty 2

## 2013-06-14 MED ORDER — ROPINIROLE HCL 1 MG PO TABS
1.0000 mg | ORAL_TABLET | Freq: Every day | ORAL | Status: DC
  Filled 2013-06-14: qty 2

## 2013-06-14 MED ORDER — LITHIUM CARBONATE 300 MG PO CAPS
ORAL_CAPSULE | ORAL | Status: AC
  Filled 2013-06-14: qty 1

## 2013-06-14 NOTE — Progress Notes (Signed)
   CARE MANAGEMENT ED NOTE 06/14/2013  Patient:  [name] [name],[name] [name]   Account Number:  [id number]  Date Initiated:  06/14/2013  Documentation initiated by:  Edwyna Shell  Subjective/Objective Assessment:   68 yo Female returned to the [facility] ED after being [place] 06/13/13 after right total hip with transfer to rehab SNF facility.     Subjective/Objective Assessment Detail:     Action/Plan:   Action/Plan Detail:   Anticipated DC Date:       Status Recommendation to Physician:   Result of Recommendation:    Other ED Services  Consult Working [place]  CM consult  Other    Choice offered to / List presented to:            Status of service:    ED Comments:   ED Comments Detail:   Spoke with patient, spouse Barbarann Ehlers, and daughter Anderson Malta. Patient reported that she got to the facility, they did not have her medications available and she panicked and demanded to come back to the ED. Explained to the patient and family that there is no medical need to remain in the ED/hospital at this time and that we can facilitate the transfer back to the facility and make sure that all systems are in place at the facility prior to transfer. Patient then stated she does not want to go back to the facility but would like to look at other options. The family agreed with this plan. Will collaborate with ED SW to look at other rehab options for appropriate dc plan for patient.

## 2013-06-14 NOTE — Progress Notes (Signed)
  Patient and family have chosen to return home with Home Health RN, PT, SW, aide. Choice list of [facility] providers made available and [facility] was chosen. Orders were placed and [facility] rep was contacted and referral communicated. Mary informed this RN that [facility] services will come out to the house tomorrow and this was communicated to the pt and spouse. The pt spouse understands that he has to be present for the [facility] visits. Patient and spouse Barbarann Ehlers stated that a rolling walker is needed in the home and they already have a BSC and shower chair. Rolling walker order was placed and [facility] was contacted to provide rolling walker to be delivered prior to discharge. Pt and spouse understand to receive walker prior to dc. Patient and spouse understand that if they do not go back to [facility] that their insurance will not cover the transfer from home. A list of private duty caregivers was then provided if needed in home for added support. Plan with home health care arrangement and DME was communicated to Dr. Audie Pinto. Patient and family also stated that they would go home via private vehicle and this was also communicated to [employer] and Dr.Beaton.

## 2013-06-14 NOTE — Discharge Instructions (Signed)
Arthralgia  Arthralgia is joint pain. A joint is a place where two bones meet. Joint pain can happen for many reasons. The joint can be bruised, stiff, infected, or weak from aging. Pain usually goes away after resting and taking medicine for soreness.   HOME CARE  · Rest the joint as told by your doctor.  · Keep the sore joint raised (elevated) for the first 24 hours.  · Put ice on the joint area.  · Put ice in a plastic bag.  · Place a towel between your skin and the bag.  · Leave the ice on for 15-20 minutes, 03-04 times a day.  · Wear your splint, casting, elastic bandage, or sling as told by your doctor.  · Only take medicine as told by your doctor. Do not take aspirin.  · Use crutches as told by your doctor. Do not put weight on the joint until told to by your doctor.  GET HELP RIGHT AWAY IF:   · You have bruising, puffiness (swelling), or more pain.  · Your fingers or toes turn blue or start to lose feeling (numb).  · Your medicine does not lessen the pain.  · Your pain becomes severe.  · You have a temperature by mouth above 102° F (38.9° C), not controlled by medicine.  · You cannot move or use the joint.  MAKE SURE YOU:   · Understand these instructions.  · Will watch your condition.  · Will get help right away if you are not doing well or get worse.  Document Released: 02/11/2009 Document Revised: 05/18/2011 Document Reviewed: 02/11/2009  ExitCare® Patient Information ©2014 [employer].

## 2013-06-14 NOTE — ED Notes (Signed)
Patient with full of anxiety of her current situation, help aid patient to call her daughter telephone, patient taking with daughter

## 2013-06-14 NOTE — ED Notes (Signed)
Called dietary to check on delay in getting pt breakfast. They advise they do not have a ticket for the patient. They will send tray for pt now

## 2013-06-14 NOTE — ED Notes (Signed)
CALLED FRANK IN PHARMACY TO CHECK ON DELAY IN GETTING PT 8 AM SYNTHROID. HE SAYES HE WILL SEND IT

## 2013-06-14 NOTE — ED Notes (Signed)
CALLED PHARMACY TO CHECK ON DELAY IN GETTING PT SYNTHROID. THEY ADVISE AGAIN. "WE ARE SENDING IT NOW".

## 2013-06-14 NOTE — ED Provider Notes (Signed)
Case manager evaluated the patient and discussed options with the family.  They prefer to go home.  They were set up with outpatient care at the house and everything may need to take care of her.  Will return to have further problems or concerns.  Patient stable at time of discharge.  Dot Lanes, MD 06/14/13 [phone]

## 2013-06-14 NOTE — ED Provider Notes (Signed)
68 year old female was just discharged earlier today following hip surgery. She was sent to the nursing home but none of the medication she was supposed to have one available to her. She requested that she come back to the ED. She now states that she would rather go home rather than to return in 2 the nursing home. However, she has no services set up at home. She will be kept overnight in our holding area and have case management work on getting home services set up in the morning.  Medical screening examination/treatment/procedure(s) were conducted as a shared visit with non-physician practitioner(s) and myself.  I personally evaluated the patient during the encounter.   Delora Fuel, MD [id number] [phone]

## 2013-06-14 NOTE — ED Notes (Signed)
Per social work and case management pt will be discharged home with home health

## 2013-06-14 NOTE — Progress Notes (Signed)
CSW spoke with admissions coordinator at [facility], [place]. Patient arrived at the facility at 4:30 via [facility] with discharge summary and packet. Per protocol, patient was given medications on-site for pain and the rest of her mediations were being ordered through [employer]. According to [facility], an RN told the patient this information and it caused the patient to have a panic attack and demand to return to the ER.  Patient's husband reported to the facility that this is a trigger for the patient and causes her extreme anxiety. After the patient reported that she would call 911 the facility had her transported back to the [facility].    Rhea Pink, MSW, [place]

## 2013-06-14 NOTE — Progress Notes (Addendum)
CSW spoke with pt/family in regards to SNF vs discharge home. Pt was discharged on 06/13/13 to [facility] and returned to ED on the same day. Pt does not want to return to [facility]. [place] informed. Pt/family has decided to go home with services. CM has arranged services with [facility] with DME to be delivered to pt's room in before discharge. Pt/ family has a detailed plan including a private duty sitter to assist with care. No further CSW intervention needed. Pt to be discharged home today.   [address], [place]

## 2013-07-03 ENCOUNTER — Telehealth: Payer: Self-pay | Admitting: *Deleted

## 2013-07-03 NOTE — Telephone Encounter (Signed)
Pt called requesting Hydrocodone 10mg .  However I did not see that med on her current med  list.  I saw Oxycodone 5mg .  Pt was a pt of Dr Linda Hedges with no current PCP.  Please advise

## 2013-07-04 NOTE — Telephone Encounter (Signed)
Spoke with pt, she is taking Oxy from her Ortho.  Advised her of MDs message

## 2013-07-04 NOTE — Telephone Encounter (Signed)
Is pt still taking pain meds post op for her ortho surgery? She got #60 percocet from Dr Mayer Camel on 06/22/13...  If she is no longer taking pain meds from ortho, we can refill as Dr Linda Hedges prev did (Norco 10/325 #90 -last filled 05/02/13)  If she is still on pain meds from ortho, call us when she is done with that because we can not fill medications while getting narcotics from another provider (as per contract... Which needs to be updated with [facility] [employer] when she comes in next...)

## 2013-07-16 ENCOUNTER — Other Ambulatory Visit: Payer: Self-pay | Admitting: Internal Medicine

## 2013-09-25 ENCOUNTER — Other Ambulatory Visit: Payer: Self-pay

## 2013-09-25 MED ORDER — SITAGLIP PHOS-METFORMIN HCL ER 100-1000 MG PO TB24: 1.0000 | ORAL_TABLET | Freq: Every day | ORAL | Status: DC

## 2013-09-25 NOTE — Telephone Encounter (Signed)
Received refill request from [facility]  requesting refills for Janumet XR . Pt last seen 2/15 by PCP Dr. Linda Hedges, will send in a 1 time fill until pt can establish a PCP  . Please advise Thanks

## 2013-11-02 ENCOUNTER — Encounter: Payer: Self-pay | Admitting: *Deleted

## 2013-11-20 ENCOUNTER — Telehealth: Payer: Self-pay | Admitting: Internal Medicine

## 2013-11-20 NOTE — Telephone Encounter (Signed)
Called earlier by patient. Has had diarrhea and urge incontinence x months Now became constipated with bad abdominal pain, no fevber and then bloody diarrhea. She is in [place]. Advised she seek care at ED or urgent care there.

## 2013-11-23 ENCOUNTER — Telehealth: Payer: Self-pay | Admitting: Internal Medicine

## 2013-11-23 NOTE — Telephone Encounter (Signed)
Spoke with patient and she was hospitalized in [place] this week. She had a flex and was told to see her GI MD soon to have a colonoscopy. She is bringing copy of the records to our office. Scheduled on 11/30/13 at 2:00 PM.

## 2013-11-27 ENCOUNTER — Encounter: Payer: Self-pay | Admitting: *Deleted

## 2013-11-30 ENCOUNTER — Ambulatory Visit (INDEPENDENT_AMBULATORY_CARE_PROVIDER_SITE_OTHER): Payer: BC Managed Care – PPO | Admitting: Internal Medicine

## 2013-11-30 ENCOUNTER — Encounter: Payer: Self-pay | Admitting: Internal Medicine

## 2013-11-30 VITALS — BP 122/72 | HR 84 | Ht 62.0 in | Wt 174.5 lb

## 2013-11-30 DIAGNOSIS — R933 Abnormal findings on diagnostic imaging of other parts of digestive tract: Secondary | ICD-10-CM

## 2013-11-30 DIAGNOSIS — K5289 Other specified noninfective gastroenteritis and colitis: Secondary | ICD-10-CM

## 2013-11-30 DIAGNOSIS — R197 Diarrhea, unspecified: Secondary | ICD-10-CM

## 2013-11-30 MED ORDER — DIPHENOXYLATE-ATROPINE 2.5-0.025 MG PO TABS: ORAL_TABLET | ORAL | Status: DC

## 2013-11-30 MED ORDER — METRONIDAZOLE 250 MG PO TABS: 250.0000 mg | ORAL_TABLET | Freq: Three times a day (TID) | ORAL | Status: DC

## 2013-11-30 MED ORDER — MOVIPREP 100 G PO SOLR: 1.0000 | Freq: Once | ORAL | Status: DC

## 2013-11-30 NOTE — Progress Notes (Signed)
[name] [name] 03-05-1946 [id number]  Note: This dictation was prepared with Dragon digital system. Any transcriptional errors that result from this procedure are unintentional.   History of Present Illness:  This is a 68 year old white female who was recently hospitalized in [facility] for acute colitis .She  presented with severe diarrhea,low volume  rectal bleeding and crampy  lower abdominal pain, mostly in the left lower quadrant. She has a history of ischemic colitis in 2012 and 2014. A flexible sigmoidoscopy in 2012 revealed ulcerations in the sigmoid colon. A CT scan of the abdomen in 2012 showed diffuse thickening and inflammatory changes along the ascending colon. A recent CT scan  in the emergency room in [place] also showed thickening of the left colon. A flexible sigmoidoscopy confirmed the presence of acute colitis. It was not clear whether this represents ischemic colitis or Crohn's disease. She is currently having diarrhea but no rectal bleeding. She is still having left lower quadrant abdominal discomfort. She has been on Percocet for chronic pain, Seroquel, lithium and lorazepam which in the past induced a low-flow state which led to ischemic colitis.    Past Medical History  Diagnosis Date  . Bilateral hearing loss   . Urinary, incontinence, stress female   . Dyslipidemia     takes Crestor daily  . Osteoarthritis cervical spine   . Osteoarthritis of hand     bilateral  . Colitis, ischemic 2012  . Sciatica   . Alcohol abuse   . Hepatic steatosis 06/18/12    severe  . Anxiety     takes Valium daily as needed and Ativan daily  . Diabetes mellitus without complication     takes Janumet daily  . Walking pneumonia     last time more than 39yrs ago  . History of bronchitis     last time at least 61yrs ago  . Confusion     r/t meds  . Numbness     in right foot  . Fibromyalgia   . Lupus     tumid-skin  . Joint pain   . Joint swelling   . Chronic back  pain     DDD  . Osteoarthritis     in hips  . IBS (irritable bowel syndrome)   . Diverticulosis   . Depression     takes Prozac daily and Bupspirone   . Restless leg syndrome     takes Requip nightly  . Urinary frequency   . Urinary urgency   . Urinary leakage   . Bipolar 1 disorder     takes Lithium nightly and Synthroid daily  . Diverticulosis   . Ischemic colitis   . Nephrolithiasis     Past Surgical History  Procedure Laterality Date  . D&c x 4    . Abdominal hysterectomy    . Tonsillectomy    . Excision of tumors at right neck      angle of jaw '68, benign  . Flexible sigmoidoscopy N/A 06/21/2012    Procedure: FLEXIBLE SIGMOIDOSCOPY;  Surgeon: Jerene Bears, MD;  Location: [facility];  Service: Gastroenterology;  Laterality: N/A;  . Appendectomy    . Tubal ligation    . Colonoscopy    . Esophagogastroduodenoscopy    . Total hip arthroplasty Right 06/09/2013    Procedure: TOTAL HIP ARTHROPLASTY;  Surgeon: Kerin Salen, MD;  Location: [place];  Service: Orthopedics;  Laterality: Right;    Allergies  Allergen Reactions  . Codeine Anxiety and Other (See Comments)  hallucinations  . Macrolides And Ketolides Nausea And Vomiting    All mycins  . Procaine Hcl Palpitations  . Neurontin [Gabapentin] Other (See Comments)    unknown  . Sulfa Antibiotics Other (See Comments)    Family history and social history have been reviewed.  Review of Systems: Persistent left lower quadrant abdominal pain. Denies fever or rectal bleeding  The remainder of the 10 point ROS is negative except as outlined in the H&P  Physical Exam: General Appearance Well developed, in no distress Eyes  Non icteric  HEENT  Non traumatic, normocephalic  Mouth No lesion, tongue papillated, no cheilosis Neck Supple without adenopathy, thyroid not enlarged, no carotid bruits, no JVD Lungs Clear to auscultation bilaterally COR Normal S1, normal S2, regular rhythm, no murmur, quiet  precordium Abdomen very tender left lower quadrant. No rebound. No distention. Bowel sounds are normoactive. Right lower and upper quadrants on unremarkable Rectal small amount of yellow liquid Hemoccult negative stool Extremities  No pedal edema Skin No lesions Neurological Alert and oriented x 3 Psychological Normal mood and affect  Assessment and Plan:   Problem #59 68 year old white female with a history of ischemic colitis with a recurrent episode while out of town 2 weeks ago. A CT scan and flexible sigmoidoscopy confirmed acute colitis in the sigmoid colon. I suspect this was a recurrent ischemic colitis. She needs a full colonoscopy but we will wait 4-6 weeks for the acute colitis to resolve before proceeding with the colonoscopy. In the meantime, she will take Flagyl 250 mg 3 times a day for possible bacterial overgrowth. She takes Lomotil 1 by mouth at bedtime and will follow a low-residue diet.    Delfin Edis 11/30/2013

## 2013-11-30 NOTE — Patient Instructions (Signed)
We have sent the following medications to your pharmacy for you to pick up at your convenience:  Flagyl and Lomotil  You have been scheduled for a colonoscopy. Please follow written instructions given to you at your visit today.  Please pick up your prep kit at the pharmacy within the next 1-3 days. If you use inhalers (even only as needed), please bring them with you on the day of your procedure. Your physician has requested that you go to www.startemmi.com and enter the access code given to you at your visit today. This web site gives a general overview about your procedure. However, you should still follow specific instructions given to you by our office regarding your preparation for the procedure.

## 2013-12-05 ENCOUNTER — Ambulatory Visit: Payer: BC Managed Care – PPO | Admitting: Internal Medicine

## 2013-12-08 ENCOUNTER — Other Ambulatory Visit: Payer: Self-pay

## 2013-12-08 DIAGNOSIS — Z1231 Encounter for screening mammogram for malignant neoplasm of breast: Secondary | ICD-10-CM

## 2013-12-11 ENCOUNTER — Encounter: Payer: Self-pay | Admitting: Internal Medicine

## 2013-12-22 ENCOUNTER — Other Ambulatory Visit: Payer: Self-pay

## 2013-12-26 ENCOUNTER — Other Ambulatory Visit (HOSPITAL_COMMUNITY): Payer: Self-pay | Admitting: Urology

## 2013-12-26 DIAGNOSIS — N1330 Unspecified hydronephrosis: Secondary | ICD-10-CM

## 2014-01-12 ENCOUNTER — Other Ambulatory Visit: Payer: Self-pay | Admitting: *Deleted

## 2014-01-12 MED ORDER — ROSUVASTATIN CALCIUM 10 MG PO TABS: ORAL_TABLET | ORAL | Status: DC

## 2014-01-16 ENCOUNTER — Encounter: Payer: Self-pay | Admitting: Cardiology

## 2014-01-23 ENCOUNTER — Ambulatory Visit (HOSPITAL_COMMUNITY): Payer: BC Managed Care – PPO

## 2014-01-24 ENCOUNTER — Ambulatory Visit (HOSPITAL_COMMUNITY)
Admission: RE | Admit: 2014-01-24 | Discharge: 2014-01-24 | Disposition: A | Discharge status: Home / Self Care | Payer: BC Managed Care – PPO | Source: Ambulatory Visit | Attending: Urology | Admitting: Urology

## 2014-01-24 ENCOUNTER — Encounter (HOSPITAL_COMMUNITY): Payer: Self-pay

## 2014-01-24 DIAGNOSIS — N1330 Unspecified hydronephrosis: Secondary | ICD-10-CM | POA: Insufficient documentation

## 2014-01-24 MED ORDER — TECHNETIUM TC 99M MERTIATIDE
15.7000 | Freq: Once | INTRAVENOUS | Status: AC | PRN
  Administered 2014-01-24: 15.7 via INTRAVENOUS

## 2014-01-24 MED ORDER — FUROSEMIDE 10 MG/ML IJ SOLN
40.0000 mg | Freq: Once | INTRAMUSCULAR | Status: AC
  Administered 2014-01-24: 39 mg via INTRAVENOUS
  Filled 2014-01-24: qty 4

## 2014-02-07 ENCOUNTER — Encounter: Payer: Self-pay | Admitting: Internal Medicine

## 2014-02-07 ENCOUNTER — Ambulatory Visit (AMBULATORY_SURGERY_CENTER): Payer: BC Managed Care – PPO | Admitting: Internal Medicine

## 2014-02-07 VITALS — BP 104/72 | HR 72 | Temp 98.6°F | Resp 16 | Ht 62.0 in | Wt 174.0 lb

## 2014-02-07 DIAGNOSIS — R197 Diarrhea, unspecified: Secondary | ICD-10-CM

## 2014-02-07 LAB — GLUCOSE, CAPILLARY
Glucose-Capillary: 117 mg/dL — ABNORMAL HIGH (ref 70–99)
Glucose-Capillary: 109 mg/dL — ABNORMAL HIGH (ref 70–99)

## 2014-02-07 MED ORDER — DICYCLOMINE HCL 10 MG PO CAPS: 10.0000 mg | ORAL_CAPSULE | Freq: Two times a day (BID) | ORAL | Status: DC

## 2014-02-07 MED ORDER — SODIUM CHLORIDE 0.9 % IV SOLN: 500.0000 mL | INTRAVENOUS | Status: DC

## 2014-02-07 NOTE — Patient Instructions (Signed)
YOU HAD AN ENDOSCOPIC PROCEDURE TODAY AT THE [facility]: Refer to the procedure report that was given to you for any specific questions about what was found during the examination.  If the procedure report does not answer your questions, please call your gastroenterologist to clarify.  If you requested that your care partner not be given the details of your procedure findings, then the procedure report has been included in a sealed envelope for you to review at your convenience later.  YOU SHOULD EXPECT: Some feelings of bloating in the abdomen. Passage of more gas than usual.  Walking can help get rid of the air that was put into your GI tract during the procedure and reduce the bloating. If you had a lower endoscopy (such as a colonoscopy or flexible sigmoidoscopy) you may notice spotting of blood in your stool or on the toilet paper. If you underwent a bowel prep for your procedure, then you may not have a normal bowel movement for a few days.  DIET: Your first meal following the procedure should be a light meal and then it is ok to progress to your normal diet.  A half-sandwich or bowl of soup is an example of a good first meal.  Heavy or fried foods are harder to digest and may make you feel nauseous or bloated.  Likewise meals heavy in dairy and vegetables can cause extra gas to form and this can also increase the bloating.  Drink plenty of fluids but you should avoid alcoholic beverages for 24 hours.  ACTIVITY: Your care partner should take you home directly after the procedure.  You should plan to take it easy, moving slowly for the rest of the day.  You can resume normal activity the day after the procedure however you should NOT DRIVE or use heavy machinery for 24 hours (because of the sedation medicines used during the test).    SYMPTOMS TO REPORT IMMEDIATELY: A gastroenterologist can be reached at any hour.  During normal business hours, 8:30 AM to 5:00 PM Monday through Friday,  call [phone].  After hours and on weekends, please call the GI answering service at [phone] who will take a message and have the physician on call contact you.   Following lower endoscopy (colonoscopy or flexible sigmoidoscopy):  Excessive amounts of blood in the stool  Significant tenderness or worsening of abdominal pains  Swelling of the abdomen that is new, acute  Fever of 100F or higher    FOLLOW UP: If any biopsies were taken you will be contacted by phone or by letter within the next 1-3 weeks.  Call your gastroenterologist if you have not heard about the biopsies in 3 weeks.  Our staff will call the home number listed on your records the next business day following your procedure to check on you and address any questions or concerns that you may have at that time regarding the information given to you following your procedure. This is a courtesy call and so if there is no answer at the home number and we have not heard from you through the emergency physician on call, we will assume that you have returned to your regular daily activities without incident.  SIGNATURES/CONFIDENTIALITY: You and/or your care partner have signed paperwork which will be entered into your electronic medical record.  These signatures attest to the fact that that the information above on your After Visit Summary has been reviewed and is understood.  Full responsibility of the confidentiality   of this discharge information lies with you and/or your care-partner.     

## 2014-02-07 NOTE — Progress Notes (Signed)
Called to room to assist during endoscopic procedure.  Patient ID and intended procedure confirmed with present staff. Received instructions for my participation in the procedure from the performing physician.  

## 2014-02-07 NOTE — Op Note (Signed)
[facility]  [address] [place], [id number]   COLONOSCOPY PROCEDURE REPORT  PATIENT: [name] [name], [name] [name]  MR#: [id number] BIRTHDATE: 1945-07-14 , 62  yrs. old GENDER: female ENDOSCOPIST: Lafayette Dragon, MD REFERRED [id number] Marquis Lunch, M.D. PROCEDURE DATE:  02/07/2014 PROCEDURE:   Colonoscopy with biopsy First Screening Colonoscopy - Avg.  risk and is 50 yrs.  old or older - No.  Prior Negative Screening - Now for repeat screening. N/A  History of Adenoma - Now for follow-up colonoscopy & has been > or = to 3 yrs.  N/A  Polyps Removed Today? No.  Polyps Removed Today? No.  Recommend repeat exam, <10 yrs? Polyps Removed Today? No.  Recommend repeat exam, <10 yrs? No. ASA CLASS:   Class II INDICATIONS:history of ischemic colitis in 2000 02/15/2013.  History of diverticulosis.  Colon polyp removed in 2012 no path report available. MEDICATIONS: Monitored anesthesia care and Propofol 230 mg IV  DESCRIPTION OF PROCEDURE:   After the risks benefits and alternatives of the procedure were thoroughly explained, informed consent was obtained.  The digital rectal exam revealed no abnormalities of the rectum.   The LB PCF Q180 [id number]  endoscope was introduced through the anus and advanced to the cecum, which was identified by both the appendix and ileocecal valve. No adverse events experienced.   The quality of the prep was Moviprep fair The instrument was then slowly withdrawn as the colon was fully examined.      COLON FINDINGS: There was moderate diverticulosis noted in the descending colon with associated tortuosity and angulation. Retroflexed views revealed no abnormalities.random biopsies of the left and right colon were obtained to rule out microscopic colitis The time to cecum=7 minutes 10 seconds.  Withdrawal time=6 minutes 18 seconds.  The scope was withdrawn and the procedure completed. COMPLICATIONS: There were no immediate complications.  ENDOSCOPIC  IMPRESSION: There was moderate diverticulosis noted in the descending colon random biopsies of the left and right colon to rule out microscopic colitis  RECOMMENDATIONS: 1.  Await pathology results 2.  Lomotil prn 3.Flagyl 250 mg po tid completed 4.recall colonoscopy in 10 years eSigned:  Lafayette Dragon, MD 02/07/2014 9:06 AM   cc:

## 2014-02-07 NOTE — Progress Notes (Signed)
Stable to RR 

## 2014-02-08 ENCOUNTER — Ambulatory Visit: Payer: BC Managed Care – PPO

## 2014-02-08 ENCOUNTER — Telehealth: Payer: Self-pay | Admitting: *Deleted

## 2014-02-08 NOTE — Telephone Encounter (Signed)
  Follow up Call-  Call back number 02/07/2014  Post procedure Call Back phone  # [phone]  Permission to leave phone message Yes     Patient questions:  Do you have a fever, pain , or abdominal swelling? No. Pain Score  0 *  Have you tolerated food without any problems? Yes.    Have you been able to return to your normal activities? Yes.    Do you have any questions about your discharge instructions: Diet   No. Medications  No. Follow up visit  No.  Do you have questions or concerns about your Care? No.  Actions: * If pain score is 4 or above: No action needed, pain <4.

## 2014-02-12 ENCOUNTER — Encounter: Payer: Self-pay | Admitting: Internal Medicine

## 2014-03-12 ENCOUNTER — Telehealth: Payer: Self-pay | Admitting: Internal Medicine

## 2014-03-12 NOTE — Telephone Encounter (Signed)
Patient states she is having problems with constipation. States her stools have been hard for a week. She will try Magnesium Citrate 1/2 bottle. If no results in 2 hours, drink the other half. She will then take Miralax prn.

## 2014-04-12 ENCOUNTER — Telehealth: Payer: Self-pay | Admitting: Internal Medicine

## 2014-04-12 NOTE — Telephone Encounter (Signed)
Spoke with patient and she states she has had diarrhea x 3 days with cramping. She has taken Lomotil once daily and it has not helped. She has not been taking the Dicyclomine. She will try this BID as ordered and call if it does not help.

## 2014-04-17 ENCOUNTER — Telehealth: Payer: Self-pay | Admitting: *Deleted

## 2014-04-17 DIAGNOSIS — R197 Diarrhea, unspecified: Secondary | ICD-10-CM

## 2014-04-17 MED ORDER — DIPHENOXYLATE-ATROPINE 2.5-0.025 MG PO TABS: ORAL_TABLET | ORAL | Status: DC

## 2014-04-17 NOTE — Telephone Encounter (Signed)
Rx printed for diphenoxylate-atroprine (LOMOTIL), 2.5-0.025 mg per tablet, #30, One (1) refill. Amy Esterwood, PA-C, signed the Rx. Called the patient to let them know the Rx was ready to be picked up from the receptionist. Patient stated she understood and would pick up today, 04/17/14.

## 2014-07-09 ENCOUNTER — Other Ambulatory Visit: Payer: Self-pay | Admitting: Physician Assistant

## 2014-07-09 NOTE — Telephone Encounter (Signed)
Faxed over Rx for lomotil, 2.5-0.025 mg, #30 with 2 refills to [facility].

## 2014-07-10 ENCOUNTER — Telehealth: Payer: Self-pay | Admitting: Internal Medicine

## 2014-07-11 NOTE — Telephone Encounter (Signed)
[place] to inquire if the patient picked up their Rx for lomotil, 2.5-0.025 mg, #30 with 2 refills. Pt did pickup Rx on 07/10/14.

## 2014-07-17 ENCOUNTER — Encounter: Payer: Self-pay | Admitting: *Deleted

## 2014-07-25 ENCOUNTER — Ambulatory Visit: Payer: Medicare Other | Admitting: Neurology

## 2014-07-31 ENCOUNTER — Encounter: Payer: Self-pay | Admitting: Neurology

## 2014-09-03 ENCOUNTER — Other Ambulatory Visit: Payer: Self-pay

## 2014-09-21 ENCOUNTER — Ambulatory Visit (INDEPENDENT_AMBULATORY_CARE_PROVIDER_SITE_OTHER): Payer: BLUE CROSS/BLUE SHIELD | Admitting: Neurology

## 2014-09-21 ENCOUNTER — Other Ambulatory Visit (INDEPENDENT_AMBULATORY_CARE_PROVIDER_SITE_OTHER): Payer: Medicare Other

## 2014-09-21 ENCOUNTER — Encounter: Payer: Self-pay | Admitting: Neurology

## 2014-09-21 VITALS — BP 106/70 | HR 68 | Ht 62.0 in | Wt 173.0 lb

## 2014-09-21 DIAGNOSIS — E236 Other disorders of pituitary gland: Secondary | ICD-10-CM | POA: Diagnosis not present

## 2014-09-21 DIAGNOSIS — E1142 Type 2 diabetes mellitus with diabetic polyneuropathy: Secondary | ICD-10-CM | POA: Insufficient documentation

## 2014-09-21 LAB — CREATININE, SERUM: Creatinine, Ser: 0.96 mg/dL (ref 0.40–1.20)

## 2014-09-21 LAB — BUN: BUN: 10 mg/dL (ref 6–23)
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
## 2014-09-21 NOTE — Patient Instructions (Addendum)
The pituitary gland size in 2012 was upper limit of normal (not enlarged).  But we will check another MRI brain with and without contrast with attention to the pituitary to look for any changes.  If it is larger, then recommend referral to endocrinology.  If it is unchanged and Dr. Orland Mustard is concerned of a pituitary disorder, I would recommend he send you to an endocrinologist.

## 2014-10-01 ENCOUNTER — Ambulatory Visit (HOSPITAL_COMMUNITY)
Admission: RE | Admit: 2014-10-01 | Discharge: 2014-10-01 | Disposition: A | Discharge status: Home / Self Care | Payer: BLUE CROSS/BLUE SHIELD | Source: Ambulatory Visit | Attending: Neurology | Admitting: Neurology

## 2014-10-01 DIAGNOSIS — E236 Other disorders of pituitary gland: Secondary | ICD-10-CM | POA: Diagnosis not present

## 2014-10-01 DIAGNOSIS — R930 Abnormal findings on diagnostic imaging of skull and head, not elsewhere classified: Secondary | ICD-10-CM | POA: Diagnosis not present

## 2014-10-01 MED ORDER — GADOBENATE DIMEGLUMINE 529 MG/ML IV SOLN
10.0000 mL | Freq: Once | INTRAVENOUS | Status: AC
  Administered 2014-10-01: 10 mL via INTRAVENOUS

## 2014-10-04 ENCOUNTER — Other Ambulatory Visit: Payer: Self-pay | Admitting: *Deleted

## 2014-10-04 MED ORDER — DIPHENOXYLATE-ATROPINE 2.5-0.025 MG PO TABS: 1.0000 | ORAL_TABLET | Freq: Every day | ORAL | Status: DC

## 2014-12-30 ENCOUNTER — Other Ambulatory Visit: Payer: Self-pay

## 2014-12-30 ENCOUNTER — Encounter (HOSPITAL_COMMUNITY): Payer: Self-pay | Admitting: Nurse Practitioner

## 2014-12-30 ENCOUNTER — Emergency Department (HOSPITAL_COMMUNITY): Payer: BLUE CROSS/BLUE SHIELD

## 2014-12-30 ENCOUNTER — Emergency Department (HOSPITAL_COMMUNITY)
Admission: EM | Admit: 2014-12-30 | Discharge: 2014-12-31 | Disposition: A | Discharge status: Home / Self Care | Payer: BLUE CROSS/BLUE SHIELD | Attending: Emergency Medicine | Admitting: Emergency Medicine

## 2014-12-30 DIAGNOSIS — F319 Bipolar disorder, unspecified: Secondary | ICD-10-CM | POA: Insufficient documentation

## 2014-12-30 DIAGNOSIS — E1165 Type 2 diabetes mellitus with hyperglycemia: Secondary | ICD-10-CM | POA: Diagnosis not present

## 2014-12-30 DIAGNOSIS — R000 Tachycardia, unspecified: Secondary | ICD-10-CM | POA: Insufficient documentation

## 2014-12-30 DIAGNOSIS — R112 Nausea with vomiting, unspecified: Secondary | ICD-10-CM | POA: Insufficient documentation

## 2014-12-30 DIAGNOSIS — Z8719 Personal history of other diseases of the digestive system: Secondary | ICD-10-CM | POA: Diagnosis not present

## 2014-12-30 DIAGNOSIS — E876 Hypokalemia: Secondary | ICD-10-CM | POA: Diagnosis not present

## 2014-12-30 DIAGNOSIS — D849 Immunodeficiency, unspecified: Secondary | ICD-10-CM | POA: Diagnosis not present

## 2014-12-30 DIAGNOSIS — G2581 Restless legs syndrome: Secondary | ICD-10-CM | POA: Insufficient documentation

## 2014-12-30 DIAGNOSIS — E785 Hyperlipidemia, unspecified: Secondary | ICD-10-CM | POA: Diagnosis not present

## 2014-12-30 DIAGNOSIS — Z8701 Personal history of pneumonia (recurrent): Secondary | ICD-10-CM | POA: Diagnosis not present

## 2014-12-30 DIAGNOSIS — Z79899 Other long term (current) drug therapy: Secondary | ICD-10-CM | POA: Diagnosis not present

## 2014-12-30 DIAGNOSIS — H9190 Unspecified hearing loss, unspecified ear: Secondary | ICD-10-CM | POA: Diagnosis not present

## 2014-12-30 DIAGNOSIS — Z87442 Personal history of urinary calculi: Secondary | ICD-10-CM | POA: Insufficient documentation

## 2014-12-30 DIAGNOSIS — E860 Dehydration: Secondary | ICD-10-CM | POA: Insufficient documentation

## 2014-12-30 DIAGNOSIS — Z8739 Personal history of other diseases of the musculoskeletal system and connective tissue: Secondary | ICD-10-CM | POA: Diagnosis not present

## 2014-12-30 DIAGNOSIS — G8929 Other chronic pain: Secondary | ICD-10-CM | POA: Diagnosis not present

## 2014-12-30 DIAGNOSIS — F419 Anxiety disorder, unspecified: Secondary | ICD-10-CM | POA: Insufficient documentation

## 2014-12-30 DIAGNOSIS — R1084 Generalized abdominal pain: Secondary | ICD-10-CM | POA: Diagnosis not present

## 2014-12-30 DIAGNOSIS — R109 Unspecified abdominal pain: Secondary | ICD-10-CM | POA: Diagnosis present

## 2014-12-30 DIAGNOSIS — R739 Hyperglycemia, unspecified: Secondary | ICD-10-CM

## 2014-12-30 LAB — COMPREHENSIVE METABOLIC PANEL
ALT: 13 U/L — ABNORMAL LOW (ref 14–54)
AST: 17 U/L (ref 15–41)
Albumin: 3.5 g/dL (ref 3.5–5.0)
Alkaline Phosphatase: 86 U/L (ref 38–126)
Anion gap: 9 (ref 5–15)
BUN: 11 mg/dL (ref 6–20)
CO2: 23 mmol/L (ref 22–32)
Calcium: 9.3 mg/dL (ref 8.9–10.3)
Chloride: 102 mmol/L (ref 101–111)
Creatinine, Ser: 1.01 mg/dL — ABNORMAL HIGH (ref 0.44–1.00)
GFR calc Af Amer: >60 mL/min (ref >60)
GFR calc non Af Amer: 57 mL/min — ABNORMAL LOW (ref >60)
Glucose, Bld: 353 mg/dL — ABNORMAL HIGH (ref 65–99)
Potassium: 3.3 mmol/L — ABNORMAL LOW (ref 3.5–5.1)
Sodium: 134 mmol/L — ABNORMAL LOW (ref 135–145)
Total Bilirubin: 0.9 mg/dL (ref 0.3–1.2)
Total Protein: 6.4 g/dL — ABNORMAL LOW (ref 6.5–8.1)

## 2014-12-30 LAB — LIPASE, BLOOD: Lipase: 22 U/L (ref 11–51)

## 2014-12-30 LAB — URINALYSIS, ROUTINE W REFLEX MICROSCOPIC
Bilirubin Urine: NEGATIVE
Glucose, UA: >1000 mg/dL — AB
Hgb urine dipstick: NEGATIVE
Ketones, ur: NEGATIVE mg/dL
Leukocytes, UA: NEGATIVE
Nitrite: NEGATIVE
Protein, ur: NEGATIVE mg/dL
Specific Gravity, Urine: 1.012 (ref 1.005–1.030)
Urobilinogen, UA: 0.2 mg/dL (ref 0.0–1.0)
pH: 6.5 (ref 5.0–8.0)

## 2014-12-30 LAB — I-STAT TROPONIN, ED: Troponin i, poc: 0 ng/mL (ref 0.00–0.08)

## 2014-12-30 LAB — URINE MICROSCOPIC-ADD ON

## 2014-12-30 LAB — CBC WITH DIFFERENTIAL/PLATELET
Basophils Absolute: 0 10*3/uL (ref 0.0–0.1)
Basophils Relative: 0 %
Eosinophils Absolute: 0.1 10*3/uL (ref 0.0–0.7)
Eosinophils Relative: 1 %
HCT: 41.9 % (ref 36.0–46.0)
Hemoglobin: 13.8 g/dL (ref 12.0–15.0)
Lymphocytes Relative: 30 %
Lymphs Abs: 1.8 10*3/uL (ref 0.7–4.0)
MCH: 29.9 pg (ref 26.0–34.0)
MCHC: 32.9 g/dL (ref 30.0–36.0)
MCV: 90.9 fL (ref 78.0–100.0)
Monocytes Absolute: 0.4 10*3/uL (ref 0.1–1.0)
Monocytes Relative: 7 %
Neutro Abs: 3.6 10*3/uL (ref 1.7–7.7)
Neutrophils Relative %: 62 %
Platelets: 142 10*3/uL — ABNORMAL LOW (ref 150–400)
RBC: 4.61 MIL/uL (ref 3.87–5.11)
RDW: 13.4 % (ref 11.5–15.5)
WBC: 5.9 10*3/uL (ref 4.0–10.5)

## 2014-12-30 LAB — CBG MONITORING, ED: Glucose-Capillary: 322 mg/dL — ABNORMAL HIGH (ref 65–99)

## 2014-12-30 LAB — I-STAT CG4 LACTIC ACID, ED
Lactic Acid, Venous: 2.05 mmol/L (ref 0.5–2.0)
Lactic Acid, Venous: 1.59 mmol/L (ref 0.5–2.0)

## 2014-12-30 MED ORDER — ONDANSETRON HCL 4 MG/2ML IJ SOLN
4.0000 mg | Freq: Once | INTRAMUSCULAR | Status: AC
  Administered 2014-12-30: 4 mg via INTRAVENOUS
  Filled 2014-12-30: qty 2

## 2014-12-30 MED ORDER — SODIUM CHLORIDE 0.9 % IV BOLUS (SEPSIS)
1000.0000 mL | Freq: Once | INTRAVENOUS | Status: AC
  Administered 2014-12-30: 1000 mL via INTRAVENOUS

## 2014-12-30 MED ORDER — IOHEXOL 300 MG/ML  SOLN: 25.0000 mL | Freq: Once | INTRAMUSCULAR | Status: DC | PRN

## 2014-12-30 MED ORDER — FENTANYL CITRATE (PF) 100 MCG/2ML IJ SOLN
50.0000 ug | Freq: Once | INTRAMUSCULAR | Status: AC
  Administered 2014-12-30: 50 ug via INTRAVENOUS
  Filled 2014-12-30: qty 2

## 2014-12-30 NOTE — ED Notes (Signed)
Pt given water to drink.  Pt now reports return of nausea and abdominal cramping.

## 2014-12-30 NOTE — ED Notes (Signed)
Lactic Acid= 2.05, PA Mercedes notified

## 2014-12-30 NOTE — ED Notes (Signed)
Pt c/o of N/V and abdominal pain, onset Friday, of note pt cbg is elevated and there has been a mix up as to how she is taking her insulin. Denies any other symptoms

## 2014-12-30 NOTE — ED Provider Notes (Signed)
CSN: [id number]     Arrival date & time 12/30/14  1801 History   First MD Initiated Contact with Patient 12/30/14 1836     Chief Complaint  Patient presents with  . Emesis  . Nausea  . Abdominal Pain     (Consider location/radiation/quality/duration/timing/severity/associated sxs/prior Treatment) HPI Comments: [name] [name] is a 69 y.o. female with a PMHx of DM2, urinary incontinence, HLD, arthritis, ischemic colitis, alcohol abuse, hepatic steatosis, anxiety, fibromyalgia, chronic paresthesias, lupus, DDD, IBS, diverticulosis, as well as multiple medical problems described below, and a PSHx of abd hysterectomy, appendectomy, and tubal ligation, who presents to the ED with complaints of 4/10 crampy generalized intermittent abdominal pain which is nonradiating worse with eating or drinking and unrelieved with Imodium, as well as nausea and nonbloody nonbilious emesis (unsure of exact number of times) which has been ongoing for 2 days. She denies any fevers, chills, chest pain, shortness breath, diarrhea, constipation, obstipation, melena, hematochezia, hematemesis, dysuria, hematuria, vaginal bleeding or discharge, numbness, tingling, weakness, recent travel, sick contacts, suspicious food intake, alcohol use, or NSAID use.   Of note, she states that just PTA she took all her home medications, which typically cause her to be drowsy.  Patient is a 69 y.o. female presenting with vomiting and abdominal pain. The history is provided by the patient. No language interpreter was used.  Emesis Severity:  Mild Duration:  2 days Timing:  Constant Number of daily episodes:  Unsure Quality:  Stomach contents Progression:  Unchanged Chronicity:  New Recent urination:  Normal Relieved by:  None tried Worsened by:  Nothing tried Ineffective treatments:  None tried Associated symptoms: abdominal pain   Associated symptoms: no arthralgias, no chills, no diarrhea, no fever and no myalgias   Risk  factors: diabetes and prior abdominal surgery   Risk factors: no alcohol use, no sick contacts, no suspect food intake and no travel to endemic areas   Abdominal Pain Associated symptoms: nausea and vomiting   Associated symptoms: no chest pain, no chills, no constipation, no diarrhea, no dysuria, no fever, no hematuria, no shortness of breath, no vaginal bleeding and no vaginal discharge     Past Medical History  Diagnosis Date  . Bilateral hearing loss   . Urinary, incontinence, stress female   . Dyslipidemia     takes Crestor daily  . Osteoarthritis cervical spine   . Osteoarthritis of hand     bilateral  . Colitis, ischemic ([place]) 2012  . Sciatica   . Alcohol abuse   . Hepatic steatosis 06/18/12    severe  . Anxiety     takes Valium daily as needed and Ativan daily  . Diabetes mellitus without complication ([place])     takes Janumet daily  . Walking pneumonia     last time more than 64yrs ago  . History of bronchitis     last time at least 69yrs ago  . Confusion     r/t meds  . Numbness     in right foot  . Fibromyalgia   . Lupus ([place])     tumid-skin  . Joint pain   . Joint swelling   . Chronic back pain     DDD  . Osteoarthritis     in hips  . IBS (irritable bowel syndrome)   . Diverticulosis   . Depression     takes Prozac daily and Bupspirone   . Restless leg syndrome     takes Requip nightly  . Urinary frequency   .  Urinary urgency   . Urinary leakage   . Bipolar 1 disorder ([place])     takes Lithium nightly and Synthroid daily  . Diverticulosis   . Ischemic colitis ([place])   . Nephrolithiasis   . Diabetes ([place])   . Hyperlipidemia    Past Surgical History  Procedure Laterality Date  . D&c x 4    . Abdominal hysterectomy    . Tonsillectomy    . Excision of tumors at right neck      angle of jaw '68, benign  . Flexible sigmoidoscopy N/A 06/21/2012    Procedure: FLEXIBLE SIGMOIDOSCOPY;  Surgeon: Jerene Bears, MD;  Location: [facility];  Service:  Gastroenterology;  Laterality: N/A;  . Appendectomy    . Tubal ligation    . Colonoscopy    . Esophagogastroduodenoscopy    . Total hip arthroplasty Right 06/09/2013    Procedure: TOTAL HIP ARTHROPLASTY;  Surgeon: Kerin Salen, MD;  Location: [facility];  Service: Orthopedics;  Laterality: Right;  . Dental surgery Left 2 weeks ago     dental implant   Family History  Problem Relation Age of Onset  . Diabetes Brother   . Alcohol abuse Brother     x 2  . Drug abuse Mother   . Alcohol abuse Mother   . Alcohol abuse Father   . Colon cancer Paternal Uncle    Social History  Substance Use Topics  . Smoking status: Never Smoker   . Smokeless tobacco: Never Used  . Alcohol Use: No   OB History    No data available     Review of Systems  Constitutional: Negative for fever and chills.  Respiratory: Negative for shortness of breath.   Cardiovascular: Negative for chest pain.  Gastrointestinal: Positive for nausea, vomiting and abdominal pain. Negative for diarrhea, constipation and blood in stool.  Genitourinary: Negative for dysuria, hematuria, vaginal bleeding and vaginal discharge.  Musculoskeletal: Negative for myalgias and arthralgias.  Skin: Negative for color change.  Allergic/Immunologic: Positive for immunocompromised state (diabetic).  Neurological: Negative for weakness and numbness.  Psychiatric/Behavioral: Negative for confusion.   10 Systems reviewed and are negative for acute change except as noted in the HPI.    Allergies  Codeine; Macrolides and ketolides; Procaine hcl; Dilaudid; Neurontin; and Sulfa antibiotics  Home Medications   Prior to Admission medications   Medication Sig Start Date End Date Taking? Authorizing Provider  BUTRANS 5 MCG/HR PTWK patch  09/03/14   Historical Provider, MD  diphenoxylate-atropine (LOMOTIL) 2.5-0.025 MG per tablet Take 1 tablet by mouth at bedtime. 10/04/14   Lafayette Dragon, MD  Divalproex Sodium (DEPAKOTE PO) Take 1 tablet by mouth  2 (two) times daily.    Historical Provider, MD  levothyroxine (LEVOTHROID) 75 MCG tablet Take 75 mcg by mouth daily before breakfast.     Historical Provider, MD  lithium 300 MG tablet Take 750 mg by mouth at bedtime. 2 1/2 tablets    Historical Provider, MD  LORazepam (ATIVAN) 2 MG tablet Take 2 mg by mouth 3 (three) times daily. scheduled    Historical Provider, MD  QUEtiapine (SEROQUEL) 200 MG tablet Take 50 mg by mouth at bedtime. 1 1/2 tablets 05/04/12   Historical Provider, MD  REXULTI 1 MG TABS Take 1 tablet by mouth daily. 06/04/14   Historical Provider, MD  rOPINIRole (REQUIP) 0.5 MG tablet Take 1-2 mg by mouth at bedtime. For restless legs (dose is based on severity)    Historical Provider, MD  rosuvastatin (CRESTOR) 10 MG tablet take 1 tablet by mouth once daily 01/12/14   Janith Lima, MD  SitaGLIPtin-MetFORMIN HCl (JANUMET XR) [phone] MG TB24 Take 1 tablet by mouth at bedtime. 09/25/13   Janith Lima, MD   BP 139/72 mmHg  Pulse 108  Temp(Src) 98.1 F (36.7 C) (Oral)  Resp 16  SpO2 95% Physical Exam  Constitutional: She is oriented to person, place, and time. Vital signs are normal. She appears well-developed and well-nourished.  Non-toxic appearance. No distress.  Afebrile, nontoxic, NAD  HENT:  Head: Normocephalic and atraumatic.  Mouth/Throat: Oropharynx is clear and moist. Mucous membranes are dry.  Dry mucous membranes  Eyes: Conjunctivae and EOM are normal. Right eye exhibits no discharge. Left eye exhibits no discharge.  Neck: Normal range of motion. Neck supple.  Cardiovascular: Regular rhythm, normal heart sounds and intact distal pulses.  Tachycardia present.  Exam reveals no gallop and no friction rub.   No murmur heard. Very mild tachycardia  Pulmonary/Chest: Effort normal and breath sounds normal. No respiratory distress. She has no decreased breath sounds. She has no wheezes. She has no rhonchi. She has no rales.  Abdominal: Soft. Normal appearance and bowel  sounds are normal. She exhibits no distension. There is generalized tenderness. There is no rigidity, no rebound, no guarding, no CVA tenderness, no tenderness at McBurney's point and negative Murphy's sign.  Soft, nondistended, +BS throughout, with very mild generalized abd TTP, no r/g/r, neg murphy's, neg mcburney's, no CVA TTP   Musculoskeletal: Normal range of motion.  Neurological: She is alert and oriented to person, place, and time. She has normal strength. No sensory deficit.  Skin: Skin is warm, dry and intact. No rash noted.  Psychiatric: She has a normal mood and affect.  Nursing note and vitals reviewed.   ED Course  Procedures (including critical care time) Labs Review Labs Reviewed  CBC WITH DIFFERENTIAL/PLATELET - Abnormal; Notable for the following:    Platelets 142 (*)    All other components within normal limits  COMPREHENSIVE METABOLIC PANEL - Abnormal; Notable for the following:    Sodium 134 (*)    Potassium 3.3 (*)    Glucose, Bld 353 (*)    Creatinine, Ser 1.01 (*)    Total Protein 6.4 (*)    ALT 13 (*)    GFR calc non Af Amer 57 (*)    All other components within normal limits  URINALYSIS, ROUTINE W REFLEX MICROSCOPIC (NOT AT [facility]) - Abnormal; Notable for the following:    Glucose, UA >1000 (*)    All other components within normal limits  CBG MONITORING, ED - Abnormal; Notable for the following:    Glucose-Capillary 322 (*)    All other components within normal limits  I-STAT CG4 LACTIC ACID, ED - Abnormal; Notable for the following:    Lactic Acid, Venous 2.05 (*)    All other components within normal limits  LIPASE, BLOOD  URINE MICROSCOPIC-ADD ON  I-STAT TROPOININ, ED  I-STAT CG4 LACTIC ACID, ED    Imaging Review No results found. I have personally reviewed and evaluated these images and lab results as part of my medical decision-making.   EKG Interpretation None      MDM   Final diagnoses:  Non-intractable vomiting with nausea, vomiting  of unspecified type  Generalized abdominal pain  Dehydration  Hyperglycemia  Hypokalemia    69 y.o. female here with n/v/abd pain x2 days. On exam, very mild generalized abd tenderness, +BS throughout,  nonperitoneal. Pt appears dry, mildly tachycardic likely from dehydration. Will get labs but doubt need for imaging at this time, doubt obstruction given that she has good BS and having BMs. Will give fluids and zofran.  Pt just took her home mediations prior to arrival, and they make her very sleepy, therefore will hold off on pain medications. Will reassess shortly.   8:42 PM Lactic 2.05 likely from dehydration. Trop neg. EKG not yet performed, will get this done now. CBC w/diff unremarkable. CMP with pseudohyponatremia due to hyperglycemia (gluc 353). K 3.3, doubt need for repletion. Lipase WNL. Pt feeling better, will PO challenge. HR now 95, improved from arrival. Will reassess shortly.   10:02 PM PO challenged and she was able to tolerate PO but it causes increase in nausea/cramping, which could be from ischemic colitis. Will repeat lactic but proceed with CT abd/pelvis. U/A unremarkable aside from >1000 glucose, no ketones or UTI. Will continue giving fluids. Will reassess shortly.   10:25 PM Pt requesting more nausea medication and pain meds in order to drink contrast. Will give fentanyl and zofran. Of note,EKG of poor quality but without ischemic changes.  12:14 AM CT scan still not done, will sign out care to [facility] PA-C at shift change. Please see her notes for further documentation of care.  BP 133/69 mmHg  Pulse 83  Temp(Src) 98.1 F (36.7 C) (Oral)  Resp 18  SpO2 92%  Meds ordered this encounter  Medications  . ondansetron (ZOFRAN) injection 4 mg    Sig:   . sodium chloride 0.9 % bolus 1,000 mL    Sig:   . sodium chloride 0.9 % bolus 1,000 mL    Sig:   . ondansetron (ZOFRAN) injection 4 mg    Sig:   . fentaNYL (SUBLIMAZE) injection 50 mcg    Sig:   . iohexol  (OMNIPAQUE) 300 MG/ML solution 25 mL    Sig:      Martena Emanuele Camprubi-Soms, PA-C 12/31/14 0015  Ezequiel Essex, MD 12/31/14 [phone]

## 2014-12-30 NOTE — ED Notes (Signed)
Pt cannot use restroom at this time, aware specimen is needed. 

## 2014-12-30 NOTE — ED Notes (Signed)
Pt ambulatory to restroom with steady gait.

## 2014-12-31 ENCOUNTER — Encounter (HOSPITAL_COMMUNITY): Payer: Self-pay

## 2014-12-31 DIAGNOSIS — R112 Nausea with vomiting, unspecified: Secondary | ICD-10-CM | POA: Diagnosis not present

## 2014-12-31 DIAGNOSIS — R1084 Generalized abdominal pain: Secondary | ICD-10-CM | POA: Diagnosis not present

## 2014-12-31 MED ORDER — ONDANSETRON HCL 4 MG PO TABS: 4.0000 mg | ORAL_TABLET | Freq: Four times a day (QID) | ORAL | Status: DC

## 2014-12-31 MED ORDER — IOHEXOL 300 MG/ML  SOLN
100.0000 mL | Freq: Once | INTRAMUSCULAR | Status: AC | PRN
  Administered 2014-12-31: 100 mL via INTRAVENOUS

## 2014-12-31 NOTE — ED Provider Notes (Signed)
Ischemic colitis vs crohn's Diagnosis unclear 2 days cramping pain, non-bloody diarrhea No fever, vomiting at home, none here. Mildly elevated lactic (resolved on recheck) CT pending to r/o ischemia  Plan:  Negative CT - refer to PCP, ?GI  CT negative for acute findings. Stable for discharge home. Recommend follow up with PCP for recheck and to discuss further outpatient care plan.  Charlann Lange, PA-C 12/31/14 2836  Ezequiel Essex, MD 12/31/14 [phone]

## 2014-12-31 NOTE — Discharge Instructions (Signed)

## 2014-12-31 NOTE — ED Notes (Signed)
Patient was alert, oriented and stable upon discharge. RN went over AVS and patient had no further questions.  

## 2015-01-22 ENCOUNTER — Emergency Department (HOSPITAL_COMMUNITY)
Admission: EM | Admit: 2015-01-22 | Discharge: 2015-01-22 | Disposition: A | Discharge status: Home / Self Care | Payer: No Typology Code available for payment source | Attending: Emergency Medicine | Admitting: Emergency Medicine

## 2015-01-22 ENCOUNTER — Emergency Department (HOSPITAL_COMMUNITY): Payer: No Typology Code available for payment source

## 2015-01-22 ENCOUNTER — Encounter (HOSPITAL_COMMUNITY): Payer: Self-pay

## 2015-01-22 DIAGNOSIS — S5011XA Contusion of right forearm, initial encounter: Secondary | ICD-10-CM | POA: Diagnosis not present

## 2015-01-22 DIAGNOSIS — G8929 Other chronic pain: Secondary | ICD-10-CM | POA: Diagnosis not present

## 2015-01-22 DIAGNOSIS — F419 Anxiety disorder, unspecified: Secondary | ICD-10-CM | POA: Insufficient documentation

## 2015-01-22 DIAGNOSIS — S20219A Contusion of unspecified front wall of thorax, initial encounter: Secondary | ICD-10-CM

## 2015-01-22 DIAGNOSIS — S39012A Strain of muscle, fascia and tendon of lower back, initial encounter: Secondary | ICD-10-CM | POA: Diagnosis not present

## 2015-01-22 DIAGNOSIS — M545 Low back pain: Secondary | ICD-10-CM | POA: Diagnosis not present

## 2015-01-22 DIAGNOSIS — E119 Type 2 diabetes mellitus without complications: Secondary | ICD-10-CM | POA: Diagnosis not present

## 2015-01-22 DIAGNOSIS — G2581 Restless legs syndrome: Secondary | ICD-10-CM | POA: Diagnosis not present

## 2015-01-22 DIAGNOSIS — S199XXA Unspecified injury of neck, initial encounter: Secondary | ICD-10-CM | POA: Diagnosis not present

## 2015-01-22 DIAGNOSIS — Z8701 Personal history of pneumonia (recurrent): Secondary | ICD-10-CM | POA: Insufficient documentation

## 2015-01-22 DIAGNOSIS — Y9389 Activity, other specified: Secondary | ICD-10-CM | POA: Insufficient documentation

## 2015-01-22 DIAGNOSIS — S299XXA Unspecified injury of thorax, initial encounter: Secondary | ICD-10-CM | POA: Diagnosis not present

## 2015-01-22 DIAGNOSIS — S161XXA Strain of muscle, fascia and tendon at neck level, initial encounter: Secondary | ICD-10-CM

## 2015-01-22 DIAGNOSIS — Y998 Other external cause status: Secondary | ICD-10-CM | POA: Diagnosis not present

## 2015-01-22 DIAGNOSIS — Z79899 Other long term (current) drug therapy: Secondary | ICD-10-CM | POA: Diagnosis not present

## 2015-01-22 DIAGNOSIS — Y92410 Unspecified street and highway as the place of occurrence of the external cause: Secondary | ICD-10-CM | POA: Insufficient documentation

## 2015-01-22 DIAGNOSIS — E785 Hyperlipidemia, unspecified: Secondary | ICD-10-CM | POA: Insufficient documentation

## 2015-01-22 DIAGNOSIS — Z8719 Personal history of other diseases of the digestive system: Secondary | ICD-10-CM | POA: Insufficient documentation

## 2015-01-22 DIAGNOSIS — S59911A Unspecified injury of right forearm, initial encounter: Secondary | ICD-10-CM | POA: Diagnosis not present

## 2015-01-22 DIAGNOSIS — R0789 Other chest pain: Secondary | ICD-10-CM | POA: Diagnosis not present

## 2015-01-22 DIAGNOSIS — H9190 Unspecified hearing loss, unspecified ear: Secondary | ICD-10-CM | POA: Insufficient documentation

## 2015-01-22 DIAGNOSIS — F319 Bipolar disorder, unspecified: Secondary | ICD-10-CM | POA: Insufficient documentation

## 2015-01-22 DIAGNOSIS — S0990XA Unspecified injury of head, initial encounter: Secondary | ICD-10-CM | POA: Diagnosis not present

## 2015-01-22 DIAGNOSIS — M158 Other polyosteoarthritis: Secondary | ICD-10-CM | POA: Insufficient documentation

## 2015-01-22 DIAGNOSIS — Z87442 Personal history of urinary calculi: Secondary | ICD-10-CM | POA: Insufficient documentation

## 2015-01-22 DIAGNOSIS — S3992XA Unspecified injury of lower back, initial encounter: Secondary | ICD-10-CM | POA: Diagnosis not present

## 2015-01-22 DIAGNOSIS — M546 Pain in thoracic spine: Secondary | ICD-10-CM | POA: Diagnosis not present

## 2015-01-22 MED ORDER — TRAMADOL HCL 50 MG PO TABS
50.0000 mg | ORAL_TABLET | Freq: Once | ORAL | Status: AC
  Administered 2015-01-22: 50 mg via ORAL
  Filled 2015-01-22: qty 1

## 2015-01-22 MED ORDER — OXYCODONE-ACETAMINOPHEN 5-325 MG PO TABS
1.0000 | ORAL_TABLET | Freq: Once | ORAL | Status: AC
  Administered 2015-01-22: 1 via ORAL
  Filled 2015-01-22: qty 1

## 2015-01-22 MED ORDER — OXYCODONE-ACETAMINOPHEN 5-325 MG PO TABS: 1.0000 | ORAL_TABLET | Freq: Four times a day (QID) | ORAL | Status: DC | PRN
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
## 2015-01-22 NOTE — ED Notes (Signed)
Patient d/c'd in wheelchair w/husband.  Reviewed f/u and medications discussed.  Patient verbalized understanding.
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
## 2015-02-05 DIAGNOSIS — G894 Chronic pain syndrome: Secondary | ICD-10-CM | POA: Diagnosis not present

## 2015-02-05 DIAGNOSIS — Z79899 Other long term (current) drug therapy: Secondary | ICD-10-CM | POA: Diagnosis not present

## 2015-03-14 DIAGNOSIS — M79606 Pain in leg, unspecified: Secondary | ICD-10-CM | POA: Diagnosis not present

## 2015-03-14 DIAGNOSIS — G894 Chronic pain syndrome: Secondary | ICD-10-CM | POA: Diagnosis not present

## 2015-03-14 DIAGNOSIS — E669 Obesity, unspecified: Secondary | ICD-10-CM | POA: Diagnosis not present

## 2015-03-14 DIAGNOSIS — M545 Low back pain: Secondary | ICD-10-CM | POA: Diagnosis not present

## 2015-03-14 DIAGNOSIS — M1288 Other specific arthropathies, not elsewhere classified, other specified site: Secondary | ICD-10-CM | POA: Diagnosis not present

## 2015-04-25 DIAGNOSIS — K5900 Constipation, unspecified: Secondary | ICD-10-CM | POA: Diagnosis not present

## 2015-04-25 DIAGNOSIS — R112 Nausea with vomiting, unspecified: Secondary | ICD-10-CM | POA: Diagnosis not present

## 2015-04-25 DIAGNOSIS — R1084 Generalized abdominal pain: Secondary | ICD-10-CM | POA: Diagnosis not present

## 2015-07-04 ENCOUNTER — Encounter (HOSPITAL_COMMUNITY): Payer: Self-pay

## 2015-07-04 ENCOUNTER — Observation Stay (HOSPITAL_COMMUNITY)
Admission: EM | Admit: 2015-07-04 | Discharge: 2015-07-06 | Disposition: A | Discharge status: Home / Self Care | Payer: BLUE CROSS/BLUE SHIELD | Attending: Internal Medicine | Admitting: Internal Medicine

## 2015-07-04 DIAGNOSIS — E039 Hypothyroidism, unspecified: Secondary | ICD-10-CM | POA: Insufficient documentation

## 2015-07-04 DIAGNOSIS — F419 Anxiety disorder, unspecified: Secondary | ICD-10-CM | POA: Diagnosis not present

## 2015-07-04 DIAGNOSIS — Z96641 Presence of right artificial hip joint: Secondary | ICD-10-CM | POA: Diagnosis not present

## 2015-07-04 DIAGNOSIS — E785 Hyperlipidemia, unspecified: Secondary | ICD-10-CM | POA: Insufficient documentation

## 2015-07-04 DIAGNOSIS — Z794 Long term (current) use of insulin: Secondary | ICD-10-CM | POA: Insufficient documentation

## 2015-07-04 DIAGNOSIS — Z79899 Other long term (current) drug therapy: Secondary | ICD-10-CM | POA: Diagnosis not present

## 2015-07-04 DIAGNOSIS — E1159 Type 2 diabetes mellitus with other circulatory complications: Secondary | ICD-10-CM | POA: Diagnosis present

## 2015-07-04 DIAGNOSIS — T428X1A Poisoning by antiparkinsonism drugs and other central muscle-tone depressants, accidental (unintentional), initial encounter: Secondary | ICD-10-CM | POA: Diagnosis not present

## 2015-07-04 DIAGNOSIS — T4391XA Poisoning by unspecified psychotropic drug, accidental (unintentional), initial encounter: Secondary | ICD-10-CM | POA: Diagnosis present

## 2015-07-04 DIAGNOSIS — R42 Dizziness and giddiness: Secondary | ICD-10-CM | POA: Diagnosis not present

## 2015-07-04 DIAGNOSIS — I10 Essential (primary) hypertension: Secondary | ICD-10-CM | POA: Insufficient documentation

## 2015-07-04 DIAGNOSIS — M19042 Primary osteoarthritis, left hand: Secondary | ICD-10-CM | POA: Diagnosis not present

## 2015-07-04 DIAGNOSIS — F319 Bipolar disorder, unspecified: Secondary | ICD-10-CM | POA: Diagnosis present

## 2015-07-04 DIAGNOSIS — T43591A Poisoning by other antipsychotics and neuroleptics, accidental (unintentional), initial encounter: Secondary | ICD-10-CM | POA: Diagnosis not present

## 2015-07-04 DIAGNOSIS — M19041 Primary osteoarthritis, right hand: Secondary | ICD-10-CM | POA: Diagnosis not present

## 2015-07-04 DIAGNOSIS — M160 Bilateral primary osteoarthritis of hip: Secondary | ICD-10-CM | POA: Diagnosis not present

## 2015-07-04 DIAGNOSIS — G2581 Restless legs syndrome: Secondary | ICD-10-CM | POA: Diagnosis not present

## 2015-07-04 DIAGNOSIS — M479 Spondylosis, unspecified: Secondary | ICD-10-CM | POA: Diagnosis not present

## 2015-07-04 DIAGNOSIS — E1140 Type 2 diabetes mellitus with diabetic neuropathy, unspecified: Secondary | ICD-10-CM | POA: Diagnosis not present

## 2015-07-04 DIAGNOSIS — R410 Disorientation, unspecified: Secondary | ICD-10-CM

## 2015-07-04 DIAGNOSIS — T50901A Poisoning by unspecified drugs, medicaments and biological substances, accidental (unintentional), initial encounter: Secondary | ICD-10-CM

## 2015-07-04 HISTORY — DX: Hypothyroidism, unspecified: E03.9
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
## 2015-07-05 ENCOUNTER — Emergency Department (HOSPITAL_COMMUNITY): Payer: BLUE CROSS/BLUE SHIELD

## 2015-07-05 ENCOUNTER — Observation Stay (HOSPITAL_COMMUNITY): Payer: BLUE CROSS/BLUE SHIELD

## 2015-07-05 ENCOUNTER — Encounter (HOSPITAL_COMMUNITY): Payer: Self-pay | Admitting: Family Medicine

## 2015-07-05 DIAGNOSIS — T4391XA Poisoning by unspecified psychotropic drug, accidental (unintentional), initial encounter: Secondary | ICD-10-CM | POA: Diagnosis present

## 2015-07-05 DIAGNOSIS — R42 Dizziness and giddiness: Secondary | ICD-10-CM | POA: Diagnosis not present

## 2015-07-05 DIAGNOSIS — E1140 Type 2 diabetes mellitus with diabetic neuropathy, unspecified: Secondary | ICD-10-CM | POA: Diagnosis not present

## 2015-07-05 DIAGNOSIS — G2581 Restless legs syndrome: Secondary | ICD-10-CM

## 2015-07-05 DIAGNOSIS — F319 Bipolar disorder, unspecified: Secondary | ICD-10-CM | POA: Diagnosis not present

## 2015-07-05 DIAGNOSIS — I10 Essential (primary) hypertension: Secondary | ICD-10-CM | POA: Diagnosis not present

## 2015-07-05 LAB — CBC WITH DIFFERENTIAL/PLATELET
Basophils Absolute: 0 10*3/uL (ref 0.0–0.1)
Basophils Relative: 0 %
Eosinophils Absolute: 0.3 10*3/uL (ref 0.0–0.7)
Eosinophils Relative: 2 %
HCT: 40 % (ref 36.0–46.0)
Hemoglobin: 13.6 g/dL (ref 12.0–15.0)
Lymphocytes Relative: 23 %
Lymphs Abs: 2.5 10*3/uL (ref 0.7–4.0)
MCH: 30.4 pg (ref 26.0–34.0)
MCHC: 34 g/dL (ref 30.0–36.0)
MCV: 89.3 fL (ref 78.0–100.0)
Monocytes Absolute: 0.6 10*3/uL (ref 0.1–1.0)
Monocytes Relative: 6 %
Neutro Abs: 7.2 10*3/uL (ref 1.7–7.7)
Neutrophils Relative %: 69 %
Platelets: 193 10*3/uL (ref 150–400)
RBC: 4.48 MIL/uL (ref 3.87–5.11)
RDW: 12.7 % (ref 11.5–15.5)
WBC: 10.6 10*3/uL — ABNORMAL HIGH (ref 4.0–10.5)

## 2015-07-05 LAB — RAPID URINE DRUG SCREEN, HOSP PERFORMED
Amphetamines: NOT DETECTED
Barbiturates: NOT DETECTED
Benzodiazepines: POSITIVE — AB
Cocaine: NOT DETECTED
Opiates: NOT DETECTED
Tetrahydrocannabinol: NOT DETECTED

## 2015-07-05 LAB — COMPREHENSIVE METABOLIC PANEL
ALT: 17 U/L (ref 14–54)
AST: 18 U/L (ref 15–41)
Albumin: 3.7 g/dL (ref 3.5–5.0)
Alkaline Phosphatase: 84 U/L (ref 38–126)
Anion gap: 10 (ref 5–15)
BUN: 15 mg/dL (ref 6–20)
CO2: 21 mmol/L — ABNORMAL LOW (ref 22–32)
Calcium: 10 mg/dL (ref 8.9–10.3)
Chloride: 110 mmol/L (ref 101–111)
Creatinine, Ser: 0.96 mg/dL (ref 0.44–1.00)
GFR calc Af Amer: >60 mL/min (ref >60)
GFR calc non Af Amer: 60 mL/min — ABNORMAL LOW (ref >60)
Glucose, Bld: 161 mg/dL — ABNORMAL HIGH (ref 65–99)
Potassium: 4.3 mmol/L (ref 3.5–5.1)
Sodium: 141 mmol/L (ref 135–145)
Total Bilirubin: 0.5 mg/dL (ref 0.3–1.2)
Total Protein: 6.5 g/dL (ref 6.5–8.1)

## 2015-07-05 LAB — GLUCOSE, CAPILLARY
Glucose-Capillary: 130 mg/dL — ABNORMAL HIGH (ref 65–99)
Glucose-Capillary: 240 mg/dL — ABNORMAL HIGH (ref 65–99)
Glucose-Capillary: 147 mg/dL — ABNORMAL HIGH (ref 65–99)

## 2015-07-05 LAB — SALICYLATE LEVEL: Salicylate Lvl: <4 mg/dL (ref 2.8–30.0)

## 2015-07-05 LAB — FERRITIN: Ferritin: 41 ng/mL (ref 11–307)

## 2015-07-05 LAB — LITHIUM LEVEL: Lithium Lvl: 0.59 mmol/L — ABNORMAL LOW (ref 0.60–1.20)

## 2015-07-05 LAB — ACETAMINOPHEN LEVEL: Acetaminophen (Tylenol), Serum: <10 ug/mL — ABNORMAL LOW (ref 10–30)

## 2015-07-05 LAB — TSH: TSH: 1.352 u[IU]/mL (ref 0.350–4.500)

## 2015-07-05 LAB — VALPROIC ACID LEVEL: Valproic Acid Lvl: 67 ug/mL (ref 50.0–100.0)

## 2015-07-05 MED ORDER — QUETIAPINE FUMARATE 100 MG PO TABS
100.0000 mg | ORAL_TABLET | Freq: Two times a day (BID) | ORAL | Status: DC
  Administered 2015-07-05 – 2015-07-06 (×3): 100 mg via ORAL
  Filled 2015-07-05 (×4): qty 1

## 2015-07-05 MED ORDER — ROPINIROLE HCL 1 MG PO TABS
1.5000 mg | ORAL_TABLET | Freq: Every day | ORAL | Status: DC
  Administered 2015-07-05 – 2015-07-06 (×2): 1.5 mg via ORAL
  Filled 2015-07-05 (×2): qty 1

## 2015-07-05 MED ORDER — INSULIN ASPART 100 UNIT/ML ~~LOC~~ SOLN
0.0000 [IU] | Freq: Three times a day (TID) | SUBCUTANEOUS | Status: DC
  Administered 2015-07-05: 1 [IU] via SUBCUTANEOUS
  Administered 2015-07-05: 3 [IU] via SUBCUTANEOUS
  Administered 2015-07-06: 1 [IU] via SUBCUTANEOUS

## 2015-07-05 MED ORDER — DIVALPROEX SODIUM ER 500 MG PO TB24
750.0000 mg | ORAL_TABLET | Freq: Every day | ORAL | Status: DC
  Administered 2015-07-05 – 2015-07-06 (×2): 750 mg via ORAL
  Filled 2015-07-05 (×2): qty 1

## 2015-07-05 MED ORDER — GABAPENTIN 100 MG PO CAPS
100.0000 mg | ORAL_CAPSULE | Freq: Three times a day (TID) | ORAL | Status: DC
  Administered 2015-07-05 – 2015-07-06 (×4): 100 mg via ORAL
  Filled 2015-07-05 (×6): qty 1

## 2015-07-05 MED ORDER — ROSUVASTATIN CALCIUM 10 MG PO TABS
10.0000 mg | ORAL_TABLET | Freq: Every day | ORAL | Status: DC
  Administered 2015-07-05 – 2015-07-06 (×2): 10 mg via ORAL
  Filled 2015-07-05 (×2): qty 1

## 2015-07-05 MED ORDER — METFORMIN HCL ER 500 MG PO TB24
1000.0000 mg | ORAL_TABLET | Freq: Two times a day (BID) | ORAL | Status: DC
  Administered 2015-07-05 – 2015-07-06 (×3): 1000 mg via ORAL
  Filled 2015-07-05 (×5): qty 2

## 2015-07-05 MED ORDER — ROPINIROLE HCL 1 MG PO TABS
2.0000 mg | ORAL_TABLET | Freq: Every day | ORAL | Status: DC
  Administered 2015-07-05: 2 mg via ORAL
  Filled 2015-07-05 (×2): qty 2

## 2015-07-05 MED ORDER — LEVOTHYROXINE SODIUM 75 MCG PO TABS
75.0000 ug | ORAL_TABLET | Freq: Every day | ORAL | Status: DC
  Administered 2015-07-05 – 2015-07-06 (×2): 75 ug via ORAL
  Filled 2015-07-05 (×3): qty 1

## 2015-07-05 MED ORDER — ACETAMINOPHEN 325 MG PO TABS
650.0000 mg | ORAL_TABLET | Freq: Four times a day (QID) | ORAL | Status: DC | PRN
  Administered 2015-07-05: 650 mg via ORAL
  Filled 2015-07-05: qty 2

## 2015-07-05 MED ORDER — DIAZEPAM 5 MG PO TABS
5.0000 mg | ORAL_TABLET | Freq: Once | ORAL | Status: AC
  Administered 2015-07-05: 5 mg via ORAL
  Filled 2015-07-05: qty 1

## 2015-07-05 MED ORDER — LINAGLIPTIN 5 MG PO TABS
5.0000 mg | ORAL_TABLET | Freq: Every day | ORAL | Status: DC
  Administered 2015-07-05 – 2015-07-06 (×2): 5 mg via ORAL
  Filled 2015-07-05 (×2): qty 1

## 2015-07-05 MED ORDER — LITHIUM CARBONATE 300 MG PO CAPS
600.0000 mg | ORAL_CAPSULE | Freq: Every day | ORAL | Status: DC
  Administered 2015-07-05: 600 mg via ORAL
  Filled 2015-07-05 (×2): qty 2

## 2015-07-05 MED ORDER — SODIUM CHLORIDE 0.9 % IV BOLUS (SEPSIS)
1000.0000 mL | Freq: Once | INTRAVENOUS | Status: AC
  Administered 2015-07-05: 1000 mL via INTRAVENOUS

## 2015-07-05 MED ORDER — LORAZEPAM 0.5 MG PO TABS
0.5000 mg | ORAL_TABLET | Freq: Four times a day (QID) | ORAL | Status: DC | PRN
  Administered 2015-07-05 (×2): 1 mg via ORAL
  Filled 2015-07-05 (×2): qty 2

## 2015-07-05 MED ORDER — SITAGLIP PHOS-METFORMIN HCL ER 50-1000 MG PO TB24: 1.0000 | ORAL_TABLET | Freq: Two times a day (BID) | ORAL | Status: DC

## 2015-07-05 MED ORDER — INSULIN ASPART 100 UNIT/ML ~~LOC~~ SOLN: 0.0000 [IU] | Freq: Every day | SUBCUTANEOUS | Status: DC

## 2015-07-05 MED ORDER — ENOXAPARIN SODIUM 40 MG/0.4ML ~~LOC~~ SOLN
40.0000 mg | SUBCUTANEOUS | Status: DC
  Administered 2015-07-05: 40 mg via SUBCUTANEOUS
  Filled 2015-07-05 (×2): qty 0.4

## 2015-07-05 NOTE — ED Notes (Signed)
Pt right eye is 20/400 and Pt's left eye is 20/400
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
## 2015-07-05 NOTE — H&P (Signed)
History and Physical  Patient Name: [name] [name]     [id number]    DOB: [birth year]    DOA: 07/04/2015 Referring provider: [name], MD PCP: London Pepper, MD  Outpatient specialists:  Kennyth Lose, Psychiatry     No outpatient neurology Patient coming from: Home  Chief Complaint: Ataxia  HPI: [name] [name] is a 70 y.o. female with a past medical history significant for Bipolar on lithium and seroquel and Depakote, NIDDM and hypothyroidism who presents with ataxia after taking excess doses of home medications.  Yesterday and today the patient had unbearable restless leg symptoms of leg twitching and involuntary leg movements, so bad that they even affected her arms she felt, so when she went to take her morning medicines she took extra ropinirole and Seroquel.  (Of note, her medication list states quetiapine 200 mg qhs and ropinirol 2 mg qhs, but she reports taking those doses BID.)    During the day, she felt "spacey", nauseated, dizzy, and with dry mouth. She had blurry vision and was bumping into things, and was afraid to drive because she felt so sedated. I gather that some of this is common for her, and she remarks to me that this is not an uncommon thing for her to be afraid to drive because of feeling "out of it" from her Seroquel. Then tonight, because her symptoms of RLS were uncontrolled still, she took extra doses again, she is not sure how many ropinirole ("maybe 10?") and extra Seroquel.  Afterwards, the feeling of sedation, dry mouth, abnormal gait were worse, she called Poison Control who recommended she come to the ER.  In the ED, she was tachycardic, mydriatic, sedated.  Normotensive.  Na 141, K 4.3, Cr 0.96, LFT normal, UDS positive for prescribed benzos, depakote normal, lithium level low, and no leukocytosis or anemia.  CT head was normal.  EDP had concern for pontine stroke, and so case was discussed with Neurology on call who recommended MRI brain for rule out.  TRH  were asked to evaluate for overdose and possible stroke.     Review of Systems:  She denied fever, chills, cough, respiratory symptoms, urinary symptoms, focal weakness, slurred speech, syncope.  All other systems negative except as just noted or noted in the history of present illness.    Past Medical History  Diagnosis Date  . Bilateral hearing loss   . Urinary, incontinence, stress female   . Dyslipidemia     takes Crestor daily  . Osteoarthritis cervical spine   . Osteoarthritis of hand     bilateral  . Colitis, ischemic ([place]) 2012  . Sciatica   . Alcohol abuse   . Hepatic steatosis 06/18/12    severe  . Anxiety     takes Valium daily as needed and Ativan daily  . Diabetes mellitus without complication ([place])     takes Janumet daily  . Walking pneumonia     last time more than 72yrs ago  . History of bronchitis     last time at least 31yrs ago  . Confusion     r/t meds  . Numbness     in right foot  . Fibromyalgia   . Lupus ([place])     tumid-skin  . Joint pain   . Joint swelling   . Chronic back pain     DDD  . Osteoarthritis     in hips  . IBS (irritable bowel syndrome)   . Diverticulosis   . Depression  takes Prozac daily and Bupspirone   . Restless leg syndrome     takes Requip nightly  . Urinary frequency   . Urinary urgency   . Urinary leakage   . Bipolar 1 disorder ([place])     takes Lithium nightly and Synthroid daily  . Diverticulosis   . Ischemic colitis ([place])   . Nephrolithiasis   . Diabetes ([place])   . Hyperlipidemia   . Hypothyroidism     Past Surgical History  Procedure Laterality Date  . D&c x 4    . Abdominal hysterectomy    . Tonsillectomy    . Excision of tumors at right neck      angle of jaw '68, benign  . Flexible sigmoidoscopy N/A 06/21/2012    Procedure: FLEXIBLE SIGMOIDOSCOPY;  Surgeon: Jerene Bears, MD;  Location: [facility];  Service: Gastroenterology;  Laterality: N/A;  . Appendectomy    . Tubal ligation    .  Colonoscopy    . Esophagogastroduodenoscopy    . Total hip arthroplasty Right 06/09/2013    Procedure: TOTAL HIP ARTHROPLASTY;  Surgeon: Kerin Salen, MD;  Location: [facility];  Service: Orthopedics;  Laterality: Right;  . Dental surgery Left 2 weeks ago     dental implant    Social History: Patient lives with her husband.  She does not smoke.  Former alcohol use.  Was in marketing for many years.  Has two daughters.  Independent with all ADLs and IADLs at baseline.      Allergies  Allergen Reactions  . Codeine Anxiety and Other (See Comments)    hallucinations  . Macrolides And Ketolides Nausea And Vomiting    All mycins  . Procaine Hcl Palpitations  . Dilaudid [Hydromorphone Hcl]   . Neurontin [Gabapentin] Other (See Comments)    unknown  . Sulfa Antibiotics Other (See Comments)    Family history: family history includes Alcohol abuse in her brother, father, and mother; Colon cancer in her paternal uncle; Diabetes in her brother; Drug abuse in her mother.  Prior to Admission medications   Medication Sig Start Date End Date Taking? Authorizing Provider  divalproex (DEPAKOTE ER) 250 MG 24 hr tablet Take 750 mg by mouth daily.  12/25/14  Yes Historical Provider, MD  gabapentin (NEURONTIN) 100 MG capsule Take 100 mg by mouth 3 (three) times daily. 06/30/15  Yes Historical Provider, MD  JANUMET XR 50-1000 MG TB24 Take 1 tablet by mouth 2 (two) times daily. 06/11/15  Yes Historical Provider, MD  levothyroxine (LEVOTHROID) 75 MCG tablet Take 75 mcg by mouth daily before breakfast.    Yes Historical Provider, MD  lithium 300 MG tablet Take 600 mg by mouth at bedtime.    Yes Historical Provider, MD  LORazepam (ATIVAN) 0.5 MG tablet Take 0.5-1 mg by mouth every 6 (six) hours as needed for anxiety or sleep.  06/25/15  Yes Historical Provider, MD  QUEtiapine (SEROQUEL) 200 MG tablet Take 200 mg by mouth at bedtime.  05/04/12  Yes Historical Provider, MD  rOPINIRole (REQUIP) 0.5 MG tablet Take 2 mg by  mouth at bedtime. For restless legs (dose is based on severity)   Yes Historical Provider, MD  rosuvastatin (CRESTOR) 10 MG tablet take 1 tablet by mouth once daily Patient taking differently: Take 10 mg by mouth daily.  01/12/14  Yes Janith Lima, MD       Physical Exam: BP 136/80 mmHg  Pulse 88  Temp(Src) 98.4 F (36.9 C)  Resp 16  SpO2  96% General appearance: Well-developed, adult female, alert and in no acute distress.  Appears tired and sluggish, dry mouth.     Eyes: Anicteric, pupils large, reactive, equal, conjunctiva pink, lids and lashes normal.     ENT: No nasal deformity, discharge, or epistaxis.  OP dry without lesions.   Skin: Warm and dry.  No jaundice.  No suspicious rashes or lesions. Cardiac: RRR, nl S1-S2, no murmurs appreciated.  Respiratory: Normal respiratory rate and rhythm.  CTAB without rales or wheezes. Abdomen: Abdomen soft without rigidity.  No HSM or TTP. No ascites, distension.   MSK: No deformities or effusions. Neuro: Cranial nerves all normal, slightly HOH.  Strength 5/5 and symmetric in arms, legs.  No involuntary leg movements are noted.  No tremor.  No asterexis.  Sensorium intact and responding to questions, attention normal.  Appears sluggish and weak, Speech is fluent.  FTN symmetric.    Psych: Behavior appropriate.  Affect blunted.  No evidence of aural or visual hallucinations or delusions.       Labs on Admission:  I have personally reviewed the following studies: The metabolic panel shows Mild non-anion Gap metabolic acidosis, normal renal function. LFTs normal. UDS positive for prescribed benzos. Depakote normal, lithium not elevated. Acetaminophen and salicylates negative. The complete blood count shows no leukocytosis, anemia, thrombocytopenia.   Radiological Exams on Admission: Ct Head Wo Contrast  07/05/2015  CLINICAL DATA:  Accidental overdose for restless legs. EXAM: CT HEAD WITHOUT CONTRAST TECHNIQUE: Contiguous axial images  were obtained from the base of the skull through the vertex without intravenous contrast. COMPARISON:  01/22/2015 FINDINGS: Skull and Sinuses:Negative for fracture or destructive process. The visualized mastoids, middle ears, and imaged paranasal sinuses are clear. Visualized orbits: Negative. Brain: Negative. No evidence of acute infarction, hemorrhage, hydrocephalus, or mass lesion/mass effect. IMPRESSION: Negative head CT. Electronically Signed   By: Monte Fantasia M.D.   On: 07/05/2015 04:10    EKG: Independently reviewed. Rate 88, QTC 451, sinus rhythm, no ST changes.    Assessment/Plan 1. Gait abnormality:  Paitent with uncontrolled RLS-type symptoms who self-titrated ropirinirole and presents with symptoms of ropinirole excess (nausea, dyskinesis, spaciness).    Causes of inadequately treated RLS should be considered (i.e. Are her medicines, in particular Seroquel, causing the symptoms?).  Symptoms are atypical for stroke and she appears outside the tPA window, but small pontine infarct was suspected by EDP and discussed with Neurology, and we will rule out with MRI this morning. -Decrease ropinirole to 1 mg in AM and 2 mg in PM -Decrease Seroquel to 100 mg BID -Follow up with Dr. Kennyth Lose to consider alternative to Seroquel -Outpatient referral to Neurology for RLS -Check ferritin, TSH -Will complete MRI as requested by Neurology   2. NIDDM:  -Continue home orals  3. Hypothyroidism:  -Continue home levothyroxine  4. Depression, Bipolar:  -Decrease Seroquel -COntinue home Depakote and gabapentin and lithium    DVT prophylaxis: Lovenox  Code Status: FULL  Family Communication: None present  Disposition Plan: Anticipate observation through the morning, if MRI is normal, and patient back to baseline, hopefully discharge this afternoon.  Otherwise, consult to Neurology for abnormal MRI. Consults called: Neurology, Dr. Nicole Kindred Medical decision making: Patient seen at 5:00 AM on  07/05/2015.  The patient was discussed with Dr. Kathrynn Humble. I recommend admission to medical surgical unit, observation status.  Clinical condition: stable.      Edwin Dada [facility] Pager [phone]

## 2015-07-05 NOTE — Progress Notes (Signed)
[facility] PROGRESS NOTE    Progress Note  [name] [name]  [id number] DOB: 1945/04/25 DOA: 07/04/2015 PCP: London Pepper, MD  Outpatient Specialists:    Brief Narrative:   [name] [name] is an 70 y.o. female with bipolar disorder who took extra doses seroquel and Requip.  Assessment/Plan:   Unintentional poisoning by psychotropic drug: Place telemetry, to monitor for arrthmias. She is ataxic. Will verify the dosage of her medication with her psychiatrist. MRu brain negative for CVA.  Diabetes mellitus with diabetic neuropathy, without long-term current use of insulin ([place]): Good control, cont current regimen  Bipolar 1 disorder ([place]): Stable.  Restless leg syndrome Cont Requip.   DVT prophylaxis: heparin order Family Communication:none Disposition Plan: Home in am Code Status:     Code Status Orders        Start     Ordered   07/05/15 0550  [name] code   Continuous     07/05/15 0550    Code Status History    Date Active Date Inactive Code Status Order ID Comments User Context   06/09/2013 10:37 AM 06/13/2013  7:02 PM [name] Code [id number]  Leighton Parody, PA-C Inpatient        IV Access:    Peripheral IV   Procedures and diagnostic studies:   Ct Head Wo Contrast  07/05/2015  CLINICAL DATA:  Accidental overdose for restless legs. EXAM: CT HEAD WITHOUT CONTRAST TECHNIQUE: Contiguous axial images were obtained from the base of the skull through the vertex without intravenous contrast. COMPARISON:  01/22/2015 FINDINGS: Skull and Sinuses:Negative for fracture or destructive process. The visualized mastoids, middle ears, and imaged paranasal sinuses are clear. Visualized orbits: Negative. Brain: Negative. No evidence of acute infarction, hemorrhage, hydrocephalus, or mass lesion/mass effect. IMPRESSION: Negative head CT. Electronically Signed   By: Monte Fantasia M.D.   On: 07/05/2015 04:10   Mr Brain Wo Contrast  07/05/2015  CLINICAL DATA:  Dizziness  EXAM: MRI HEAD WITHOUT CONTRAST TECHNIQUE: Multiplanar, multiecho pulse sequences of the brain and surrounding structures were obtained without intravenous contrast. COMPARISON:  CT 06/27/2015 FINDINGS: Negative for acute infarct.  No significant chronic ischemia. Mild atrophy, typical for age.  Negative for hydrocephalus. Negative for intracranial hemorrhage.  No fluid collection. Negative for mass or edema.  No shift of the midline structures. Mild mucosal edema in the paranasal sinuses. Normal orbit. Normal pituitary. Normal skullbase. Circle Willis patent. IMPRESSION: Negative Electronically Signed   By: Franchot Gallo M.D.   On: 07/05/2015 07:06     Medical Consultants:    None.  Anti-Infectives:   none  Subjective:    Forde Dandy she is still tremolo [place], no further lower ext discomfort.  Objective:    Filed Vitals:   07/04/15 2309 07/05/15 0201 07/05/15 0435 07/05/15 0601  BP: 151/99 130/85 136/80 129/68  Pulse: 119 100 88 85  Temp: 98.4 F (36.9 C)   98.3 F (36.8 C)  TempSrc:    Oral  Resp: 15 24 16 20   Height:    5\' 2"  (1.575 m)  Weight:    76.9 kg (169 lb 8.5 oz)  SpO2: 97% 98% 96% 98%    Intake/Output Summary (Last 24 hours) at 07/05/15 0957 Last data filed at 07/05/15 0813  Gross per 24 hour  Intake    120 ml  Output      0 ml  Net    120 ml   Filed Weights   07/05/15 0601  Weight: 76.9 kg (169 lb 8.5 oz)  Exam: General exam: In no acute distress. Respiratory system: Good air movement and clear to auscultation. Cardiovascular system: S1 & S2 heard, RRR. No JVD, murmurs, rubs, gallops or clicks. No pedal edema. Gastrointestinal system: Abdomen is nondistended, soft and nontender.  Central nervous system: Alert and oriented. Ataxic gait. Skin: No rashes, lesions or ulcers Psychiatry: Judgement and insight appear normal. Mood & affect appropriate.    Data Reviewed:    Labs: Basic Metabolic Panel:  Recent Labs Lab 07/05/15 0149  NA 141    K 4.3  CL 110  CO2 21*  GLUCOSE 161*  BUN 15  CREATININE 0.96  CALCIUM 10.0   GFR Estimated Creatinine Clearance: 54.6 mL/min (by C-G formula based on Cr of 0.96). Liver Function Tests:  Recent Labs Lab 07/05/15 0149  AST 18  ALT 17  ALKPHOS 84  BILITOT 0.5  PROT 6.5  ALBUMIN 3.7   No results for input(s): LIPASE, AMYLASE in the last 168 hours. No results for input(s): AMMONIA in the last 168 hours. Coagulation profile No results for input(s): INR, PROTIME in the last 168 hours.  CBC:  Recent Labs Lab 07/05/15 0149  WBC 10.6*  NEUTROABS 7.2  HGB 13.6  HCT 40.0  MCV 89.3  PLT 193   Cardiac Enzymes: No results for input(s): CKTOTAL, CKMB, CKMBINDEX, TROPONINI in the last 168 hours. BNP (last 3 results) No results for input(s): PROBNP in the last 8760 hours. CBG:  Recent Labs Lab 07/05/15 0751  GLUCAP 130*   D-Dimer: No results for input(s): DDIMER in the last 72 hours. Hgb A1c: No results for input(s): HGBA1C in the last 72 hours. Lipid Profile: No results for input(s): CHOL, HDL, LDLCALC, TRIG, CHOLHDL, LDLDIRECT in the last 72 hours. Thyroid function studies:  Recent Labs  07/05/15 0153  TSH 1.352   Anemia work up:  Recent Labs  07/05/15 0526  FERRITIN 41   Sepsis Labs:  Recent Labs Lab 07/05/15 0149  WBC 10.6*   Microbiology No results found for this or any previous visit (from the past 240 hour(s)).   Medications:   . divalproex  750 mg Oral Daily  . enoxaparin (LOVENOX) injection  40 mg Subcutaneous Q24H  . gabapentin  100 mg Oral TID  . insulin aspart  0-5 Units Subcutaneous QHS  . insulin aspart  0-9 Units Subcutaneous TID WC  . levothyroxine  75 mcg Oral QAC breakfast  . linagliptin  5 mg Oral Daily  . lithium carbonate  600 mg Oral QHS  . metFORMIN  1,000 mg Oral BID WC  . QUEtiapine  100 mg Oral BID  . rOPINIRole  1.5 mg Oral Daily  . rOPINIRole  2 mg Oral QHS  . rosuvastatin  10 mg Oral Daily   Continuous  Infusions:   Time spent: 25 min     Charlynne Cousins  [facility] Pager [phone]  *Please refer to [email or web address], password [email or web address] to get updated schedule on who will round on this patient, as hospitalists switch teams weekly. If 7PM-7AM, please contact night-coverage at [email or web address], password [email or web address] for any overnight needs.  07/05/2015, 9:57 AM

## 2015-07-05 NOTE — ED Provider Notes (Signed)
CSN: [id number]     Arrival date & time 07/04/15  2244 History  By signing my name below, I, [name] [name], attest that this documentation has been prepared under the direction and in the presence of [name] Biles, MD. Electronically Signed: Virgel [name], ED Scribe. 07/05/2015. 4:24 AM.   Chief Complaint  Patient presents with  . Drug Overdose    accidental   The history is provided by the patient. No language interpreter was used.  HPI Comments: [name] [name] is a 70 y.o. female who presents to the Emergency Department after a possible drug overdose that occurred earlier tonight. Patient states that she has restless leg syndrome and took at least 10 Requip (started in afternoon) and Seroquel (started in morning upon waking) throughout the day yesterday. She called Poison Control who advised pt to come to the ED immediately. She reports intermittent, gradually worsening lightheadness that she describes as room-spinning, dizziness, completely resolved nausea, difficulty ambulating secondary to unsteady gait, bilateral visual blurriness. She takes lithium 2x once a day, Depakote 3x daily. Denies taking any other extra medication. Denies hx of CVAs and intracranial hemorrhages. Denies numbness, tingling, HAs, neck pain, neck stiffness.  Past Medical History  Diagnosis Date  . Bilateral hearing loss   . Urinary, incontinence, stress female   . Dyslipidemia     takes Crestor daily  . Osteoarthritis cervical spine   . Osteoarthritis of hand     bilateral  . Colitis, ischemic ([place]) 2012  . Sciatica   . Alcohol abuse   . Hepatic steatosis 06/18/12    severe  . Anxiety     takes Valium daily as needed and Ativan daily  . Diabetes mellitus without complication ([place])     takes Janumet daily  . Walking pneumonia     last time more than 5yrs ago  . History of bronchitis     last time at least 49yrs ago  . Confusion     r/t meds  . Numbness     in right foot  . Fibromyalgia    . Lupus ([place])     tumid-skin  . Joint pain   . Joint swelling   . Chronic back pain     DDD  . Osteoarthritis     in hips  . IBS (irritable bowel syndrome)   . Diverticulosis   . Depression     takes Prozac daily and Bupspirone   . Restless leg syndrome     takes Requip nightly  . Urinary frequency   . Urinary urgency   . Urinary leakage   . Bipolar 1 disorder ([place])     takes Lithium nightly and Synthroid daily  . Diverticulosis   . Ischemic colitis ([place])   . Nephrolithiasis   . Diabetes ([place])   . Hyperlipidemia    Past Surgical History  Procedure Laterality Date  . D&c x 4    . Abdominal hysterectomy    . Tonsillectomy    . Excision of tumors at right neck      angle of jaw '68, benign  . Flexible sigmoidoscopy N/A 06/21/2012    Procedure: FLEXIBLE SIGMOIDOSCOPY;  Surgeon: Jerene Bears, MD;  Location: [facility];  Service: Gastroenterology;  Laterality: N/A;  . Appendectomy    . Tubal ligation    . Colonoscopy    . Esophagogastroduodenoscopy    . Total hip arthroplasty Right 06/09/2013    Procedure: TOTAL HIP ARTHROPLASTY;  Surgeon: Kerin Salen, MD;  Location: [facility];  Service: Orthopedics;  Laterality: Right;  . Dental surgery Left 2 weeks ago     dental implant   Family History  Problem Relation Age of Onset  . Diabetes Brother   . Alcohol abuse Brother     x 2  . Drug abuse Mother   . Alcohol abuse Mother   . Alcohol abuse Father   . Colon cancer Paternal Uncle    Social History  Substance Use Topics  . Smoking status: Never Smoker   . Smokeless tobacco: Never Used  . Alcohol Use: No   OB History    No data available     Review of Systems    Allergies  Codeine; Macrolides and ketolides; Procaine hcl; Dilaudid; Neurontin; and Sulfa antibiotics  Home Medications   Prior to Admission medications   Medication Sig Start Date End Date Taking? Authorizing Provider  divalproex (DEPAKOTE ER) 250 MG 24 hr tablet Take 750 mg by mouth daily.   12/25/14  Yes Historical Provider, MD  gabapentin (NEURONTIN) 100 MG capsule Take 100 mg by mouth 3 (three) times daily. 06/30/15  Yes Historical Provider, MD  JANUMET XR 50-1000 MG TB24 Take 1 tablet by mouth 2 (two) times daily. 06/11/15  Yes Historical Provider, MD  levothyroxine (LEVOTHROID) 75 MCG tablet Take 75 mcg by mouth daily before breakfast.    Yes Historical Provider, MD  lithium 300 MG tablet Take 600 mg by mouth at bedtime.    Yes Historical Provider, MD  LORazepam (ATIVAN) 0.5 MG tablet Take 0.5-1 mg by mouth every 6 (six) hours as needed for anxiety or sleep.  06/25/15  Yes Historical Provider, MD  QUEtiapine (SEROQUEL) 200 MG tablet Take 200 mg by mouth at bedtime.  05/04/12  Yes Historical Provider, MD  rOPINIRole (REQUIP) 0.5 MG tablet Take 2 mg by mouth at bedtime. For restless legs (dose is based on severity)   Yes Historical Provider, MD  rosuvastatin (CRESTOR) 10 MG tablet take 1 tablet by mouth once daily Patient taking differently: Take 10 mg by mouth daily.  01/12/14  Yes Janith Lima, MD  diphenoxylate-atropine (LOMOTIL) 2.5-0.025 MG per tablet Take 1 tablet by mouth at bedtime. Patient not taking: Reported on 07/04/2015 10/04/14   Lafayette Dragon, MD  oxyCODONE-acetaminophen (PERCOCET/ROXICET) 5-325 MG tablet Take 1 tablet by mouth every 6 (six) hours as needed for severe pain. Patient not taking: Reported on 07/04/2015 01/22/15   Lajean Saver, MD  SitaGLIPtin-MetFORMIN HCl (JANUMET XR) [phone] MG TB24 Take 1 tablet by mouth at bedtime. Patient not taking: Reported on 07/04/2015 09/25/13   Janith Lima, MD   BP 130/85 mmHg  Pulse 100  Temp(Src) 98.4 F (36.9 C)  Resp 24  SpO2 98% Physical Exam  Constitutional: She is oriented to person, place, and time. She appears well-developed and well-nourished.  HENT:  Head: Normocephalic.  Eyes: EOM are normal.  Pupils 4 mm and equal. No nystagmus.  Neck: Normal range of motion.  Pulmonary/Chest: Effort normal.  Abdominal:  She exhibits no distension.  Musculoskeletal: Normal range of motion.  Neurological: She is alert and oriented to person, place, and time.  Cranial nerves II-XII intact. Cerebellar exam is normal. Upper and lower extremity sensory and motor exam equal and normal. Pt able to ambulate on her own but favoring her right side.  Psychiatric: She has a normal mood and affect.  Nursing note and vitals reviewed.   ED Course  Procedures   DIAGNOSTIC STUDIES: Oxygen Saturation is 97% on RA, normal  by my interpretation.    COORDINATION OF CARE: 1:22 AM Will order labs and Valium. Will consult with Poison Control. Discussed treatment plan with pt at bedside and pt agreed to plan.  2:23 AM Spoke with pt's nurse who stated pt is sleeping peacefully. Nurse will change Valium order to PRN.  3:33 AM Spoke with poison control in regards to pt's symptoms and her clinical exam findings. They don't think few extra tablets of Requip or Seroquel could be the cause her symptoms, however chronic extra consumption can lead to the confusion and dizziness pt is having. They recommend holding the pt until her symptoms are better. We will get a CT scan of her head to ensure there is no stroke and to rule out bleeds. Pt will be admitted for further evaluation.  @4 :00 am: Spoke with Neurology, Dr. Nicole Kindred agrees with the plan to get MRI to r/o organic etiology. Pt can be transferred to [facility] if stroke. If not better, and if needed Neuro can be consulted tomorrow.  Labs Review Labs Reviewed  CBC WITH DIFFERENTIAL/PLATELET - Abnormal; Notable for the following:    WBC 10.6 (*)    All other components within normal limits  COMPREHENSIVE METABOLIC PANEL - Abnormal; Notable for the following:    CO2 21 (*)    Glucose, Bld 161 (*)    GFR calc non Af Amer 60 (*)    All other components within normal limits  URINE RAPID DRUG SCREEN, HOSP PERFORMED - Abnormal; Notable for the following:    Benzodiazepines POSITIVE (*)     All other components within normal limits  ACETAMINOPHEN LEVEL - Abnormal; Notable for the following:    Acetaminophen (Tylenol), Serum <10 (*)    All other components within normal limits  LITHIUM LEVEL - Abnormal; Notable for the following:    Lithium Lvl 0.59 (*)    All other components within normal limits  SALICYLATE LEVEL  VALPROIC ACID LEVEL    Imaging Review Ct Head Wo Contrast  07/05/2015  CLINICAL DATA:  Accidental overdose for restless legs. EXAM: CT HEAD WITHOUT CONTRAST TECHNIQUE: Contiguous axial images were obtained from the base of the skull through the vertex without intravenous contrast. COMPARISON:  01/22/2015 FINDINGS: Skull and Sinuses:Negative for fracture or destructive process. The visualized mastoids, middle ears, and imaged paranasal sinuses are clear. Visualized orbits: Negative. Brain: Negative. No evidence of acute infarction, hemorrhage, hydrocephalus, or mass lesion/mass effect. IMPRESSION: Negative head CT. Electronically Signed   By: Monte Fantasia M.D.   On: 07/05/2015 04:10   I have personally reviewed and evaluated these images and lab results as part of my medical decision-making.   EKG Interpretation None      MDM   Final diagnoses:  Accidental overdose, initial encounter  Dizziness  Confused but orients easily   I personally performed the services described in this documentation, which was scribed in my presence. The recorded information has been reviewed and is accurate.   Pt comes in with cc of overdose - intention, but not with the intent to harm. She took unknown number of requip pills (10 or so) and few extra seroquels to get relief from her restless leg syndrome. Pills taken over the course of the day. Pt has some dizziness, blurry vision and feels "spacey" which i am perceiving as having difficulty to focus - and all of them are common side effects. Labs ordered. Pt looks comfortable.   [name] Biles, MD 07/05/15 0425

## 2015-07-06 DIAGNOSIS — F319 Bipolar disorder, unspecified: Secondary | ICD-10-CM | POA: Diagnosis not present

## 2015-07-06 DIAGNOSIS — E1140 Type 2 diabetes mellitus with diabetic neuropathy, unspecified: Secondary | ICD-10-CM

## 2015-07-06 DIAGNOSIS — I10 Essential (primary) hypertension: Secondary | ICD-10-CM | POA: Diagnosis not present

## 2015-07-06 DIAGNOSIS — T4391XA Poisoning by unspecified psychotropic drug, accidental (unintentional), initial encounter: Secondary | ICD-10-CM | POA: Diagnosis not present

## 2015-07-06 LAB — BASIC METABOLIC PANEL
Anion gap: 8 (ref 5–15)
BUN: 15 mg/dL (ref 6–20)
CO2: 24 mmol/L (ref 22–32)
Calcium: 9.7 mg/dL (ref 8.9–10.3)
Chloride: 106 mmol/L (ref 101–111)
Creatinine, Ser: 0.96 mg/dL (ref 0.44–1.00)
GFR calc Af Amer: >60 mL/min (ref >60)
GFR calc non Af Amer: 60 mL/min — ABNORMAL LOW (ref >60)
Glucose, Bld: 183 mg/dL — ABNORMAL HIGH (ref 65–99)
Potassium: 4.1 mmol/L (ref 3.5–5.1)
Sodium: 138 mmol/L (ref 135–145)

## 2015-07-06 LAB — FERRITIN: Ferritin: 43 ng/mL (ref 11–307)

## 2015-07-06 LAB — GLUCOSE, CAPILLARY: Glucose-Capillary: 137 mg/dL — ABNORMAL HIGH (ref 65–99)

## 2015-07-06 MED ORDER — ROPINIROLE HCL 0.5 MG PO TABS: 1.5000 mg | ORAL_TABLET | Freq: Every day | ORAL | Status: DC

## 2015-07-06 MED ORDER — ROPINIROLE HCL 2 MG PO TABS: 2.0000 mg | ORAL_TABLET | Freq: Every day | ORAL | Status: DC

## 2015-07-06 MED ORDER — GABAPENTIN 100 MG PO CAPS: 100.0000 mg | ORAL_CAPSULE | Freq: Three times a day (TID) | ORAL | Status: DC

## 2015-07-06 MED ORDER — QUETIAPINE FUMARATE 100 MG PO TABS: 100.0000 mg | ORAL_TABLET | Freq: Two times a day (BID) | ORAL | Status: DC

## 2015-07-06 NOTE — Progress Notes (Signed)
Patient notified of discharge.  Educated on discharge instructions, medications, and follow-up appointment.  Educated patient and husband on medication changes - dosage changes in Seroquel, Requip, and Gabapentin,  Patient and husband stated understanding.  AVS signed.  IV removed.  Telemetry discontinued and CCMD notified.  No questions or concerns at this time.  Escorted to ride via wheelchair with NT.

## 2015-07-06 NOTE — Discharge Summary (Signed)
Physician Discharge Summary  [name] [name] [id number] DOB: January 17, 1946 DOA: 07/04/2015  PCP: London Pepper, MD  Admit date: 07/04/2015 Discharge date: 07/06/2015  Time spent: 35 minutes  Recommendations for Outpatient Follow-up:  1. Follow up with Psychiatry as an outpatient.   Discharge Diagnoses:  Principal Problem:   Unintentional poisoning by psychotropic drug Active Problems:   Diabetes mellitus with diabetic neuropathy, without long-term current use of insulin [name]   Hypertension   Bipolar 1 disorder ([place])   Restless leg syndrome   Discharge Condition:stable  Diet recommendation: regular  Filed Weights   07/05/15 0601  Weight: 76.9 kg (169 lb 8.5 oz)    History of present illness:  70 year old with past medical history of bipolar disorder on lithium; Depakote that was having unbearable restless leg symptoms with twitching and involuntary movement so bad that it affected her arm so she decided to take extra Requip and to request came into the hospital with ataxia, tachycardia and my mydriatic eyes.  Hospital Course:  Unintentional cervical and Requip overdose:  She was placed on telemetry with no events her QT remained stable with no arrhythmias Brain MRI was negative for CVA. Her medications were held. Her medications were readjusted as below. She was counseled about not taking extra medication she will follow-up with her psychiatrist in 2 weeks.  Diabetes mellitus with peripheral neuropathy: No change of major medication.  Bipolar disorder: Stable.  Restless leg syndrome: Resume Requip.  Procedures:  [name] MRI  CT head  Consultations:  none  Discharge Exam: Filed Vitals:   07/05/15 2111 07/06/15 0439  BP: 124/78 126/68  Pulse: 94 83  Temp: 98.3 F (36.8 C) 97.7 F (36.5 C)  Resp: 16 16    General: A&O x3 Cardiovascular: RRR Respiratory: good air movement CTA B/L  Discharge Instructions   Discharge Instructions    Diet - low  sodium heart healthy    Complete by:  As directed      Increase activity slowly    Complete by:  As directed           Current Discharge Medication List    CONTINUE these medications which have CHANGED   Details  !! gabapentin (NEURONTIN) 100 MG capsule Take 1 capsule (100 mg total) by mouth 3 (three) times daily.    QUEtiapine (SEROQUEL) 100 MG tablet Take 1 tablet (100 mg total) by mouth 2 (two) times daily.    !! rOPINIRole (REQUIP) 0.5 MG tablet Take 3 tablets (1.5 mg total) by mouth daily.    !! rOPINIRole (REQUIP) 2 MG tablet Take 1 tablet (2 mg total) by mouth at bedtime. Qty: 10 tablet, Refills: 0     !! - Potential duplicate medications found. Please discuss with provider.    CONTINUE these medications which have NOT CHANGED   Details  divalproex (DEPAKOTE ER) 250 MG 24 hr tablet Take 750 mg by mouth daily.  Refills: 0    !! gabapentin (NEURONTIN) 100 MG capsule Take 300-400 mg by mouth at bedtime.  Refills: 0    JANUMET XR 50-1000 MG TB24 Take 1 tablet by mouth 2 (two) times daily. Refills: 1    levothyroxine (LEVOTHROID) 75 MCG tablet Take 75 mcg by mouth daily before breakfast.     lithium 300 MG tablet Take 600 mg by mouth at bedtime.     LORazepam (ATIVAN) 0.5 MG tablet Take 0.5-1 mg by mouth every 6 (six) hours as needed for anxiety or sleep.  Refills: 0    rosuvastatin (CRESTOR)  10 MG tablet take 1 tablet by mouth once daily Qty: 90 tablet, Refills: 0     !! - Potential duplicate medications found. Please discuss with provider.     Allergies  Allergen Reactions  . Codeine Anxiety and Other (See Comments)    hallucinations  . Macrolides And Ketolides Nausea And Vomiting    All mycins  . Procaine Hcl Palpitations  . Dilaudid [Hydromorphone Hcl]   . Neurontin [Gabapentin] Other (See Comments)    unknown  . Sulfa Antibiotics Other (See Comments)   Follow-up Information    Follow up with London Pepper, MD In 2 weeks.   Specialty:  Family Medicine    Contact information:   [address] [place] [address] [phone]        The results of significant diagnostics from this hospitalization (including imaging, microbiology, ancillary and laboratory) are listed below for reference.    Significant Diagnostic Studies: Ct Head Wo Contrast  07/05/2015  CLINICAL DATA:  Accidental overdose for restless legs. EXAM: CT HEAD WITHOUT CONTRAST TECHNIQUE: Contiguous axial images were obtained from the base of the skull through the vertex without intravenous contrast. COMPARISON:  01/22/2015 FINDINGS: Skull and Sinuses:Negative for fracture or destructive process. The visualized mastoids, middle ears, and imaged paranasal sinuses are clear. Visualized orbits: Negative. Brain: Negative. No evidence of acute infarction, hemorrhage, hydrocephalus, or mass lesion/mass effect. IMPRESSION: Negative head CT. Electronically Signed   By: Monte Fantasia M.D.   On: 07/05/2015 04:10   Mr Brain Wo Contrast  07/05/2015  CLINICAL DATA:  Dizziness EXAM: MRI HEAD WITHOUT CONTRAST TECHNIQUE: Multiplanar, multiecho pulse sequences of the brain and surrounding structures were obtained without intravenous contrast. COMPARISON:  CT 06/27/2015 FINDINGS: Negative for acute infarct.  No significant chronic ischemia. Mild atrophy, typical for age.  Negative for hydrocephalus. Negative for intracranial hemorrhage.  No fluid collection. Negative for mass or edema.  No shift of the midline structures. Mild mucosal edema in the paranasal sinuses. Normal orbit. Normal pituitary. Normal skullbase. Circle Willis patent. IMPRESSION: Negative Electronically Signed   By: Franchot Gallo M.D.   On: 07/05/2015 07:06    Microbiology: No results found for this or any previous visit (from the past 240 hour(s)).   Labs: Basic Metabolic Panel:  Recent Labs Lab 07/05/15 0149 07/06/15 0459  NA 141 138  K 4.3 4.1  CL 110 106  CO2 21* 24  GLUCOSE 161* 183*  BUN 15  15  CREATININE 0.96 0.96  CALCIUM 10.0 9.7   Liver Function Tests:  Recent Labs Lab 07/05/15 0149  AST 18  ALT 17  ALKPHOS 84  BILITOT 0.5  PROT 6.5  ALBUMIN 3.7   No results for input(s): LIPASE, AMYLASE in the last 168 hours. No results for input(s): AMMONIA in the last 168 hours. CBC:  Recent Labs Lab 07/05/15 0149  WBC 10.6*  NEUTROABS 7.2  HGB 13.6  HCT 40.0  MCV 89.3  PLT 193   Cardiac Enzymes: No results for input(s): CKTOTAL, CKMB, CKMBINDEX, TROPONINI in the last 168 hours. BNP: BNP (last 3 results) No results for input(s): BNP in the last 8760 hours.  ProBNP (last 3 results) No results for input(s): PROBNP in the last 8760 hours.  CBG:  Recent Labs Lab 07/05/15 0751 07/05/15 1137 07/05/15 2204 07/06/15 0726  GLUCAP 130* 240* 147* 137*     Signed:  Charlynne Cousins MD.  [facility] 07/06/2015, 9:33 AM

## 2015-07-24 ENCOUNTER — Emergency Department (HOSPITAL_COMMUNITY)
Admission: EM | Admit: 2015-07-24 | Discharge: 2015-07-24 | Disposition: A | Discharge status: Home / Self Care | Payer: BLUE CROSS/BLUE SHIELD | Attending: Emergency Medicine | Admitting: Emergency Medicine

## 2015-07-24 ENCOUNTER — Encounter (HOSPITAL_COMMUNITY): Payer: Self-pay

## 2015-07-24 DIAGNOSIS — Z79899 Other long term (current) drug therapy: Secondary | ICD-10-CM | POA: Insufficient documentation

## 2015-07-24 DIAGNOSIS — M797 Fibromyalgia: Secondary | ICD-10-CM | POA: Insufficient documentation

## 2015-07-24 DIAGNOSIS — Z96641 Presence of right artificial hip joint: Secondary | ICD-10-CM | POA: Diagnosis not present

## 2015-07-24 DIAGNOSIS — K5790 Diverticulosis of intestine, part unspecified, without perforation or abscess without bleeding: Secondary | ICD-10-CM | POA: Insufficient documentation

## 2015-07-24 DIAGNOSIS — M19041 Primary osteoarthritis, right hand: Secondary | ICD-10-CM | POA: Insufficient documentation

## 2015-07-24 DIAGNOSIS — M19042 Primary osteoarthritis, left hand: Secondary | ICD-10-CM | POA: Insufficient documentation

## 2015-07-24 DIAGNOSIS — F319 Bipolar disorder, unspecified: Secondary | ICD-10-CM | POA: Diagnosis not present

## 2015-07-24 DIAGNOSIS — R159 Full incontinence of feces: Secondary | ICD-10-CM | POA: Diagnosis not present

## 2015-07-24 DIAGNOSIS — M160 Bilateral primary osteoarthritis of hip: Secondary | ICD-10-CM | POA: Diagnosis not present

## 2015-07-24 DIAGNOSIS — M1990 Unspecified osteoarthritis, unspecified site: Secondary | ICD-10-CM | POA: Insufficient documentation

## 2015-07-24 DIAGNOSIS — E119 Type 2 diabetes mellitus without complications: Secondary | ICD-10-CM | POA: Insufficient documentation

## 2015-07-24 DIAGNOSIS — Z7984 Long term (current) use of oral hypoglycemic drugs: Secondary | ICD-10-CM | POA: Insufficient documentation

## 2015-07-24 DIAGNOSIS — E039 Hypothyroidism, unspecified: Secondary | ICD-10-CM | POA: Diagnosis not present

## 2015-07-24 DIAGNOSIS — E785 Hyperlipidemia, unspecified: Secondary | ICD-10-CM | POA: Diagnosis not present

## 2015-07-24 LAB — URINALYSIS, ROUTINE W REFLEX MICROSCOPIC
Bilirubin Urine: NEGATIVE
Glucose, UA: 250 mg/dL — AB
Hgb urine dipstick: NEGATIVE
Ketones, ur: NEGATIVE mg/dL
Leukocytes, UA: NEGATIVE
Nitrite: NEGATIVE
Protein, ur: NEGATIVE mg/dL
Specific Gravity, Urine: 1.011 (ref 1.005–1.030)
pH: 6.5 (ref 5.0–8.0)

## 2015-07-24 LAB — COMPREHENSIVE METABOLIC PANEL
ALT: 15 U/L (ref 14–54)
AST: 18 U/L (ref 15–41)
Albumin: 3.9 g/dL (ref 3.5–5.0)
Alkaline Phosphatase: 78 U/L (ref 38–126)
Anion gap: 7 (ref 5–15)
BUN: 12 mg/dL (ref 6–20)
CO2: 24 mmol/L (ref 22–32)
Calcium: 9.8 mg/dL (ref 8.9–10.3)
Chloride: 106 mmol/L (ref 101–111)
Creatinine, Ser: 0.91 mg/dL (ref 0.44–1.00)
GFR calc Af Amer: >60 mL/min (ref >60)
GFR calc non Af Amer: >60 mL/min (ref >60)
Glucose, Bld: 165 mg/dL — ABNORMAL HIGH (ref 65–99)
Potassium: 4.5 mmol/L (ref 3.5–5.1)
Sodium: 137 mmol/L (ref 135–145)
Total Bilirubin: 0.4 mg/dL (ref 0.3–1.2)
Total Protein: 6.8 g/dL (ref 6.5–8.1)

## 2015-07-24 LAB — CBC
HCT: 44.2 % (ref 36.0–46.0)
Hemoglobin: 14.5 g/dL (ref 12.0–15.0)
MCH: 30 pg (ref 26.0–34.0)
MCHC: 32.8 g/dL (ref 30.0–36.0)
MCV: 91.3 fL (ref 78.0–100.0)
Platelets: 217 10*3/uL (ref 150–400)
RBC: 4.84 MIL/uL (ref 3.87–5.11)
RDW: 13.1 % (ref 11.5–15.5)
WBC: 8.2 10*3/uL (ref 4.0–10.5)

## 2015-07-24 LAB — POC OCCULT BLOOD, ED: Fecal Occult Bld: NEGATIVE

## 2015-07-24 LAB — LIPASE, BLOOD: Lipase: 28 U/L (ref 11–51)

## 2015-07-24 NOTE — Discharge Instructions (Signed)
Fecal Incontinence Fecal incontinence, also called accidental bowel leakage, is not being able to control your bowels. This condition happens because the nerves or muscles around the anus do not work the way they should. This affects their ability to hold stool. CAUSES  This condition may be caused by:  Damage to the muscles at the end of the rectum (sphincter).  Damage to the nerves that control bowel movements.  Diarrhea.  Chronic constipation.  Pelvic floor dysfunction. This means the muscles in the pelvis do not work well.  Loss of bowel storage capacity. RISK FACTORS This condition is more likely to develop in people who:   Are born with bowels or a pelvis that has not formed correctly.  Have had rectal surgery.  Have had radiation treatment for certain cancers.  Have irritable bowel syndrome (IBS).  Have an inflammatory bowel disease (IBD), such as Crohn disease.  Have been pregnant, had a vaginal delivery, or had surgery that damaged the pelvic floor muscles.  Have a complicated childbirth, spinal cord injury, or other trauma that causes nerve damage.  Have a condition that can affect nerve function, such as diabetes, Parkinson disease, or multiple sclerosis.  Have a condition where the rectum drops down into the anus or vagina (prolapse).  Are older. SYMPTOMS  The main symptom of this condition is not being able to control your bowels. You also might not be able get to the bathroom before a bowel movement. DIAGNOSIS  This condition is diagnosed with a medical history and physical exam. You may also have tests, including:   Blood tests.  Urine tests.  A rectal exam.  Ultrasound.  MRI.  Colonoscopy. This is an exam that looks at your large intestine (colon).  Anal manometry. This is a test that measures the strength of the anal sphincter.  Anal electromyogram (EMG). This is a test that uses small electrodes to check for nerve damage. TREATMENT    Treatment varies depending on the cause and severity of your condition. Treatment may also focus on addressing any underlying causes of this condition. Treatment may include:  Medicines. This may include medicines to:  Prevent diarrhea.  Help with constipation (laxatives).  Treat any underlying conditions.  Physical therapy.  Fiber supplements. These can help manage your bowel movements.  Nerve stimulation.  Injectable gel to promote tissue growth and better muscle control.  Surgery. You may need:  Sphincter repair surgery.  Diversion surgery. This procedure lets feces pass out of your body through a hole in your abdomen. HOME CARE INSTRUCTIONS  Diet  Follow instructions from your health care provider about any eating or drinking restrictions. Work with a dietitian to come up with a healthy diet and to help you avoid the foods that can make your condition worse. Keep a diet diary to find out which foods or drinks could be making your fecal incontinence worse.  Drink enough fluid to keep your urine clear or pale yellow. Lifestyle  If you smoke, talk to your health care provider about quitting. This may help your condition.  If you are overweight, talk to your health care provider about how to safely lose weight. This may help your condition.  Increase your physical activity as told by your health care provider. This may help your condition. Always talk to your health care provider before starting a new exercise program.  Carry a change of clothes and supplies to clean up quickly if you have an episode of fetal incontinence.  Consider joining a  fecal incontinence support group. You can find a support group online or in your local community. General Instructions  Take over-the-counter and prescription medicines only as told by your health care provider. This includes any supplements.  Apply a moisture barrier, such as petroleum jelly, to your rectum. This protects the skin  from irritation caused by ongoing leaking or diarrhea.  Tell your health care provider if you are upset or depressed about your condition.  SEEK MEDICAL CARE IF:   You have a fever.  You have redness, swelling, or pain around your rectum.  Your pain is getting worse or you lose feeling in your rectal area.  You have blood in your stool.  You feel sad or hopeless.  You avoid social or work situations. SEEK IMMEDIATE MEDICAL CARE IF:   You stop having bowel movements.  You cannot eat or drink without vomiting.  You have rectal bleeding that does not stop.  You have severe pain that is getting worse.  You have symptoms of dehydration, including:  Sleepiness or fatigue.  Producing little or no urine, tears, or sweat.  Dizziness.  Dry mouth.  Unusual irritability.  Headache.  Inability to think clearly. FOR MORE INFORMATION  American Academy of [employer]: www.AromatherapyParty.no International Foundation for Functional Gastrointestinal Disorders: www.iffgd.org   This information is not intended to replace advice given to you by your health care provider. Make sure you discuss any questions you have with your health care provider.   Document Released: 02/05/2004 Document Revised: 11/14/2014 Document Reviewed: 08/01/2014 Elsevier Interactive Patient Education [employer].

## 2015-07-24 NOTE — ED Notes (Signed)
She c/o urgency of bowel and "many times I don't even make it to the bathroom".  She also c/o diffuse lower abd. Discomfort "which I've had for a long time, but it's getting worse lately".  She is in no distress.

## 2015-07-24 NOTE — ED Provider Notes (Addendum)
CSN: [id number]     Arrival date & time 07/24/15  1704 History   First MD Initiated Contact with Patient 07/24/15 1900     Chief Complaint  Patient presents with  . Encopresis     (Consider location/radiation/quality/duration/timing/severity/associated sxs/prior Treatment) HPI   [name] [name] is a 70 y.o. female who presents for intermittent episodes of leaking stool, which occurs when she has an urge to void, but can't get better with time. This is happened several times in the last 3 days. She's had other episodes numerous times in the past. Several years ago had colitis and wonders if she has that now. He denies blood in stool, liquid diarrhea, fever, chills, nausea, vomiting, weakness or dizziness. No recent changes in medications. No recent use of antibiotics. There are no other no modifying factors.   Past Medical History  Diagnosis Date  . Bilateral hearing loss   . Urinary, incontinence, stress female   . Dyslipidemia     takes Crestor daily  . Osteoarthritis cervical spine   . Osteoarthritis of hand     bilateral  . Colitis, ischemic ([place]) 2012  . Sciatica   . Alcohol abuse   . Hepatic steatosis 06/18/12    severe  . Anxiety     takes Valium daily as needed and Ativan daily  . Diabetes mellitus without complication ([place])     takes Janumet daily  . Walking pneumonia     last time more than 20yrs ago  . History of bronchitis     last time at least 31yrs ago  . Confusion     r/t meds  . Numbness     in right foot  . Fibromyalgia   . Lupus ([place])     tumid-skin  . Joint pain   . Joint swelling   . Chronic back pain     DDD  . Osteoarthritis     in hips  . IBS (irritable bowel syndrome)   . Diverticulosis   . Depression     takes Prozac daily and Bupspirone   . Restless leg syndrome     takes Requip nightly  . Urinary frequency   . Urinary urgency   . Urinary leakage   . Bipolar 1 disorder ([place])     takes Lithium nightly and Synthroid daily  .  Diverticulosis   . Ischemic colitis ([place])   . Nephrolithiasis   . Diabetes ([place])   . Hyperlipidemia   . Hypothyroidism    Past Surgical History  Procedure Laterality Date  . D&c x 4    . Abdominal hysterectomy    . Tonsillectomy    . Excision of tumors at right neck      angle of jaw '68, benign  . Flexible sigmoidoscopy N/A 06/21/2012    Procedure: FLEXIBLE SIGMOIDOSCOPY;  Surgeon: Jerene Bears, MD;  Location: [facility];  Service: Gastroenterology;  Laterality: N/A;  . Appendectomy    . Tubal ligation    . Colonoscopy    . Esophagogastroduodenoscopy    . Total hip arthroplasty Right 06/09/2013    Procedure: TOTAL HIP ARTHROPLASTY;  Surgeon: Kerin Salen, MD;  Location: [facility];  Service: Orthopedics;  Laterality: Right;  . Dental surgery Left 2 weeks ago     dental implant   Family History  Problem Relation Age of Onset  . Diabetes Brother   . Alcohol abuse Brother     x 2  . Drug abuse Mother   . Alcohol abuse Mother   .  Alcohol abuse Father   . Colon cancer Paternal Uncle    Social History  Substance Use Topics  . Smoking status: Never Smoker   . Smokeless tobacco: Never Used  . Alcohol Use: No   OB History    No data available     Review of Systems  All other systems reviewed and are negative.     Allergies  Codeine; Macrolides and ketolides; Procaine hcl; Dilaudid; and Sulfa antibiotics  Home Medications   Prior to Admission medications   Medication Sig Start Date End Date Taking? Authorizing Provider  acetaminophen (TYLENOL) 500 MG tablet Take 1,000 mg by mouth every 6 (six) hours as needed for mild pain, moderate pain, fever or headache.   Yes Historical Provider, MD  cholecalciferol (VITAMIN D) 1000 units tablet Take 1,000 Units by mouth daily.   Yes Historical Provider, MD  divalproex (DEPAKOTE ER) 250 MG 24 hr tablet Take 750 mg by mouth at bedtime.  12/25/14  Yes Historical Provider, MD  gabapentin (NEURONTIN) 100 MG capsule Take 1 capsule (100  mg total) by mouth 3 (three) times daily. Patient taking differently: Take 100 mg by mouth 2 (two) times daily. Takes 100mg  at 0100 and 100mg  1730 07/06/15  Yes Charlynne Cousins, MD  JANUMET XR 50-1000 MG TB24 Take 1 tablet by mouth 2 (two) times daily. 06/11/15  Yes Historical Provider, MD  levothyroxine (LEVOTHROID) 75 MCG tablet Take 75 mcg by mouth daily before breakfast.    Yes Historical Provider, MD  lithium 300 MG tablet Take 600 mg by mouth at bedtime.    Yes Historical Provider, MD  LORazepam (ATIVAN) 0.5 MG tablet Take 0.5-1 mg by mouth every 6 (six) hours as needed for anxiety or sleep.  06/25/15  Yes Historical Provider, MD  QUEtiapine (SEROQUEL) 200 MG tablet Take 100-200 mg by mouth 2 (two) times daily. Takes 100mg  at 1700 and 200mg  at [place] 06/10/15  Yes Historical Provider, MD  rOPINIRole (REQUIP) 0.5 MG tablet Take 3 tablets (1.5 mg total) by mouth daily. Patient taking differently: Take 1.5-2 mg by mouth 2 (two) times daily. Tkes 1.5mg  at 1700 and 2mg  at 1930 07/06/15  Yes Charlynne Cousins, MD  rosuvastatin (CRESTOR) 10 MG tablet take 1 tablet by mouth once daily Patient taking differently: Take 10 mg by mouth daily.  01/12/14  Yes Janith Lima, MD  rOPINIRole (REQUIP) 2 MG tablet Take 1 tablet (2 mg total) by mouth at bedtime. Patient not taking: Reported on 07/24/2015 07/06/15   Charlynne Cousins, MD   BP 144/72 mmHg  Pulse 96  Temp(Src) 98.4 F (36.9 C) (Oral)  Resp 18  SpO2 96% Physical Exam  Constitutional: She is oriented to person, place, and time. She appears well-developed and well-nourished. No distress.  HENT:  Head: Normocephalic and atraumatic.  Right Ear: External ear normal.  Left Ear: External ear normal.  Eyes: Conjunctivae and EOM are normal. Pupils are equal, round, and reactive to light.  Neck: Normal range of motion and phonation normal. Neck supple.  Cardiovascular: Normal rate, regular rhythm and normal heart sounds.   Pulmonary/Chest: Effort  normal and breath sounds normal. She exhibits no bony tenderness.  Abdominal: Soft. There is no tenderness.  Genitourinary:  Normal anus- small amount of brown stool in rectal vault. No fecal impaction, and no appreciable hard stool.  Musculoskeletal: Normal range of motion.  Neurological: She is alert and oriented to person, place, and time. No cranial nerve deficit or sensory deficit. She exhibits  normal muscle tone. Coordination normal.  Skin: Skin is warm, dry and intact.  Psychiatric: She has a normal mood and affect. Her behavior is normal. Judgment and thought content normal.  Nursing note and vitals reviewed.   ED Course  Procedures (including critical care time)  Medications - No data to display  Patient Vitals for the past 24 hrs:  BP Temp Temp src Pulse Resp SpO2  07/24/15 1930 144/72 mmHg - - 96 - 96 %  07/24/15 1750 149/74 mmHg 98.4 F (36.9 C) Oral 94 18 97 %    7:47 PM Reevaluation with update and discussion. After initial assessment and treatment, an updated evaluation reveals No additional complaints. Findings discussed with patient, all questions answered. Tellis Spivak L    Labs Review Labs Reviewed  COMPREHENSIVE METABOLIC PANEL - Abnormal; Notable for the following:    Glucose, Bld 165 (*)    All other components within normal limits  URINALYSIS, ROUTINE W REFLEX MICROSCOPIC (NOT AT [facility]) - Abnormal; Notable for the following:    Glucose, UA 250 (*)    All other components within normal limits  LIPASE, BLOOD  CBC  POC OCCULT BLOOD, ED  POC OCCULT BLOOD, ED    Imaging Review No results found. I have personally reviewed and evaluated these images and lab results as part of my medical decision-making.   EKG Interpretation None      MDM   Final diagnoses:  Fecal incontinence    Loose bowel movements without evidence for anal sphincter incompetence. Noticed for acute bowel infection, colitis, metabolic instability or impending vascular collapse.  No evidence for constipation on exam. Possible medication related.  Nursing Notes Reviewed/ Care Coordinated Applicable Imaging Reviewed Interpretation of Laboratory Data incorporated into ED treatment  The patient appears reasonably screened and/or stabilized for discharge and I doubt any other medical condition or other [facility] requiring further screening, evaluation, or treatment in the ED at this time prior to discharge.  Plan: Home Medications- Imodium or Kaopectate, when necessary.; Home Treatments- drink plenty of fluids.; return here if the recommended treatment, does not improve the symptoms; Recommended follow up- PCP checkup for further diagnostic evaluation.     Daleen Bo, MD 07/24/15 [phone]

## 2015-07-24 NOTE — ED Notes (Signed)
Rectal exam per Dr. Eulis Foster, hemmocult stool to mini-lab/per Dr. Eulis Foster, moderate amount hard stool noted in rectum

## 2015-08-15 ENCOUNTER — Ambulatory Visit (INDEPENDENT_AMBULATORY_CARE_PROVIDER_SITE_OTHER): Payer: BLUE CROSS/BLUE SHIELD | Admitting: Gastroenterology

## 2015-08-15 ENCOUNTER — Encounter: Payer: Self-pay | Admitting: Gastroenterology

## 2015-08-15 VITALS — BP 124/80 | HR 96 | Ht 60.5 in | Wt 169.5 lb

## 2015-08-15 DIAGNOSIS — R159 Full incontinence of feces: Secondary | ICD-10-CM

## 2015-08-15 DIAGNOSIS — R197 Diarrhea, unspecified: Secondary | ICD-10-CM

## 2015-08-15 MED ORDER — HYOSCYAMINE SULFATE ER 0.375 MG PO TB12: 0.3750 mg | ORAL_TABLET | Freq: Every day | ORAL | Status: DC

## 2015-08-15 NOTE — Progress Notes (Signed)
[facility] Progress Note  Chief Complaint: Fecal incontinence.  Subjective History:   [name] [name] 11/2013: "This is a 70 year old white female who was recently hospitalized in [facility] for acute colitis .She  presented with severe diarrhea,low volume  rectal bleeding and crampy  lower abdominal pain, mostly in the left lower quadrant. She has a history of ischemic colitis in 2012 and 2014. A flexible sigmoidoscopy in 2012 revealed ulcerations in the sigmoid colon. A CT scan of the abdomen in 2012 showed diffuse thickening and inflammatory changes along the ascending colon. A recent CT scan  in the emergency room in [place] also showed thickening of the left colon. A flexible sigmoidoscopy confirmed the presence of acute colitis. It was not clear whether this represents ischemic colitis or Crohn's disease. She is currently having diarrhea but no rectal bleeding. She is still having left lower quadrant abdominal discomfort. She has been on Percocet for chronic pain, Seroquel, lithium and lorazepam which in the past induced a low-flow state which led to ischemic colitis.  Problem #73 70 year old white female with a history of ischemic colitis with a recurrent episode while out of town 2 weeks ago. A CT scan and flexible sigmoidoscopy confirmed acute colitis in the sigmoid colon. I suspect this was a recurrent ischemic colitis. She needs a full colonoscopy but we will wait 4-6 weeks for the acute colitis to resolve before proceeding with the colonoscopy. In the meantime, she will take Flagyl 250 mg 3 times a day for possible bacterial overgrowth. She takes Lomotil 1 by mouth at bedtime and will follow a low-residue diet." Colonoscopy 02/2014 showed diverticuloisis, and Bx normal.  [name] [name] is here to see me for the first time today, having previously seen Dr. Olevia [name]. She reports at least a year of very bothersome episodes where she will have the urgent need for a BM and then have an accident with  fecal incontinence. She reports little or no warning before it hits. When that occurs, stool is usually soft or loose, though sometimes formed. Most of the time she has a BM every day to every other day. She is not certain when it started in relation to beginning the [address]. She has lately been taking 2 Imodium once a day that seems to help a little. She denies rectal bleeding anorexia or weight loss. Of note in her history and on med review, she has fibromyalgia and mood disorder. ROS: Cardiovascular:  no chest pain Respiratory: no dyspnea  The patient's Past Medical, Family and Social History were reviewed and are on file in the EMR. She had an episiotomy with her first child Objective:  Med list reviewed  Vital signs in last 24 hrs: Filed Vitals:   08/15/15 1540  BP: 124/80  Pulse: 96    Physical Exam   HEENT: sclera anicteric, oral mucosa moist without lesions  Neck: supple, no thyromegaly, JVD or lymphadenopathy  Cardiac: RRR without murmurs, S1S2 heard, no peripheral edema  Pulm: clear to auscultation bilaterally, normal RR and effort noted  Abdomen: soft, Mild bandlike lower tenderness, with active bowel sounds. No guarding or palpable hepatosplenomegaly.  Skin; warm and dry, no jaundice or rash Rectal: decreased resting ST, good voluntary squeeze in anal canal and puborectalis (Chaperoned by our MA Sophia) Recent Labs:    Radiologic studies: Normal CTAP 12/2014  @ASSESSMENTPLANBEGIN @ Assessment: Encounter Diagnoses  Name Primary?  . Diarrhea, unspecified type Yes  . Fecal incontinence    This sounds like rectal spasm, IBS rather than anorectal dysmotility. She  has only mildly decreased resting sphincter tone, and good voluntary sphincter tone.   Plan: Trial of hyoscyamine 0.375 mg once daily instead of Imodium. If not improved, she can speak with her PCP about possibly stopping the Janumet for short while to see if the metformin might be bothering  her.  Once daily fiber supplement as a bulk agent   [name]

## 2015-08-15 NOTE — Patient Instructions (Addendum)
We have sent the following medications to your pharmacy for you to pick up at your convenience: Levbid   If you are age 68 or older, your body mass index should be between 23-30. Your Body mass index is 32.55 kg/(m^2). If this is out of the aforementioned range listed, please consider follow up with your Primary Care Provider.  If you are age 19 or younger, your body mass index should be between 19-25. Your Body mass index is 32.55 kg/(m^2). If this is out of the aformentioned range listed, please consider follow up with your Primary Care Provider.   Thank you for choosing [facility]  Dr Wilfrid Lund III   Fiber:   2 tablespoons per day of citrucel or benefiber

## 2015-10-08 ENCOUNTER — Other Ambulatory Visit: Payer: Self-pay | Admitting: Gastroenterology

## 2015-10-19 DIAGNOSIS — B373 Candidiasis of vulva and vagina: Secondary | ICD-10-CM | POA: Diagnosis not present

## 2015-10-27 ENCOUNTER — Inpatient Hospital Stay (HOSPITAL_COMMUNITY)
Admission: EM | Admit: 2015-10-27 | Discharge: 2015-10-31 | DRG: 394 | Disposition: A | Discharge status: Home / Self Care | Payer: Medicare Other | Attending: Internal Medicine | Admitting: Internal Medicine

## 2015-10-27 ENCOUNTER — Encounter (HOSPITAL_COMMUNITY): Payer: Self-pay | Admitting: Emergency Medicine

## 2015-10-27 DIAGNOSIS — A09 Infectious gastroenteritis and colitis, unspecified: Secondary | ICD-10-CM | POA: Diagnosis present

## 2015-10-27 DIAGNOSIS — K5730 Diverticulosis of large intestine without perforation or abscess without bleeding: Secondary | ICD-10-CM | POA: Diagnosis present

## 2015-10-27 DIAGNOSIS — M19041 Primary osteoarthritis, right hand: Secondary | ICD-10-CM | POA: Diagnosis present

## 2015-10-27 DIAGNOSIS — Z833 Family history of diabetes mellitus: Secondary | ICD-10-CM

## 2015-10-27 DIAGNOSIS — Z885 Allergy status to narcotic agent status: Secondary | ICD-10-CM

## 2015-10-27 DIAGNOSIS — Z9049 Acquired absence of other specified parts of digestive tract: Secondary | ICD-10-CM

## 2015-10-27 DIAGNOSIS — F1021 Alcohol dependence, in remission: Secondary | ICD-10-CM | POA: Diagnosis present

## 2015-10-27 DIAGNOSIS — K529 Noninfective gastroenteritis and colitis, unspecified: Secondary | ICD-10-CM | POA: Diagnosis not present

## 2015-10-27 DIAGNOSIS — F419 Anxiety disorder, unspecified: Secondary | ICD-10-CM | POA: Diagnosis present

## 2015-10-27 DIAGNOSIS — Z811 Family history of alcohol abuse and dependence: Secondary | ICD-10-CM

## 2015-10-27 DIAGNOSIS — E1159 Type 2 diabetes mellitus with other circulatory complications: Secondary | ICD-10-CM | POA: Diagnosis present

## 2015-10-27 DIAGNOSIS — M549 Dorsalgia, unspecified: Secondary | ICD-10-CM | POA: Diagnosis present

## 2015-10-27 DIAGNOSIS — Z87442 Personal history of urinary calculi: Secondary | ICD-10-CM

## 2015-10-27 DIAGNOSIS — K589 Irritable bowel syndrome without diarrhea: Secondary | ICD-10-CM | POA: Diagnosis present

## 2015-10-27 DIAGNOSIS — H9193 Unspecified hearing loss, bilateral: Secondary | ICD-10-CM | POA: Diagnosis present

## 2015-10-27 DIAGNOSIS — K559 Vascular disorder of intestine, unspecified: Secondary | ICD-10-CM | POA: Diagnosis not present

## 2015-10-27 DIAGNOSIS — Z90710 Acquired absence of both cervix and uterus: Secondary | ICD-10-CM

## 2015-10-27 DIAGNOSIS — G8929 Other chronic pain: Secondary | ICD-10-CM | POA: Diagnosis present

## 2015-10-27 DIAGNOSIS — M19042 Primary osteoarthritis, left hand: Secondary | ICD-10-CM | POA: Diagnosis present

## 2015-10-27 DIAGNOSIS — Z884 Allergy status to anesthetic agent status: Secondary | ICD-10-CM

## 2015-10-27 DIAGNOSIS — K760 Fatty (change of) liver, not elsewhere classified: Secondary | ICD-10-CM | POA: Diagnosis present

## 2015-10-27 DIAGNOSIS — Z7984 Long term (current) use of oral hypoglycemic drugs: Secondary | ICD-10-CM

## 2015-10-27 DIAGNOSIS — G2581 Restless legs syndrome: Secondary | ICD-10-CM | POA: Diagnosis present

## 2015-10-27 DIAGNOSIS — R110 Nausea: Secondary | ICD-10-CM | POA: Diagnosis present

## 2015-10-27 DIAGNOSIS — Z800 Family history of malignant neoplasm of digestive organs: Secondary | ICD-10-CM

## 2015-10-27 DIAGNOSIS — Z79899 Other long term (current) drug therapy: Secondary | ICD-10-CM

## 2015-10-27 DIAGNOSIS — Z96641 Presence of right artificial hip joint: Secondary | ICD-10-CM | POA: Diagnosis present

## 2015-10-27 DIAGNOSIS — G43909 Migraine, unspecified, not intractable, without status migrainosus: Secondary | ICD-10-CM | POA: Diagnosis present

## 2015-10-27 DIAGNOSIS — E039 Hypothyroidism, unspecified: Secondary | ICD-10-CM | POA: Diagnosis present

## 2015-10-27 DIAGNOSIS — Z882 Allergy status to sulfonamides status: Secondary | ICD-10-CM

## 2015-10-27 DIAGNOSIS — R740 Nonspecific elevation of levels of transaminase and lactic acid dehydrogenase [LDH]: Secondary | ICD-10-CM | POA: Diagnosis not present

## 2015-10-27 DIAGNOSIS — F319 Bipolar disorder, unspecified: Secondary | ICD-10-CM | POA: Diagnosis present

## 2015-10-27 DIAGNOSIS — E785 Hyperlipidemia, unspecified: Secondary | ICD-10-CM | POA: Diagnosis present

## 2015-10-27 DIAGNOSIS — R7989 Other specified abnormal findings of blood chemistry: Secondary | ICD-10-CM

## 2015-10-27 DIAGNOSIS — M797 Fibromyalgia: Secondary | ICD-10-CM | POA: Diagnosis present

## 2015-10-27 DIAGNOSIS — I10 Essential (primary) hypertension: Secondary | ICD-10-CM | POA: Diagnosis present

## 2015-10-27 DIAGNOSIS — R109 Unspecified abdominal pain: Secondary | ICD-10-CM | POA: Diagnosis not present

## 2015-10-27 DIAGNOSIS — E1140 Type 2 diabetes mellitus with diabetic neuropathy, unspecified: Secondary | ICD-10-CM | POA: Diagnosis present

## 2015-10-27 DIAGNOSIS — M329 Systemic lupus erythematosus, unspecified: Secondary | ICD-10-CM | POA: Diagnosis present

## 2015-10-27 DIAGNOSIS — Z888 Allergy status to other drugs, medicaments and biological substances status: Secondary | ICD-10-CM

## 2015-10-27 NOTE — ED Triage Notes (Signed)
Pt reports cramping in abd that started today around 1900 tonight along with medium red blood noted in stool. PT reports prior hx of colitis. Denies nausea or vomiting.

## 2015-10-28 ENCOUNTER — Emergency Department (HOSPITAL_COMMUNITY): Payer: Medicare Other

## 2015-10-28 ENCOUNTER — Encounter (HOSPITAL_COMMUNITY): Payer: Self-pay

## 2015-10-28 DIAGNOSIS — G43909 Migraine, unspecified, not intractable, without status migrainosus: Secondary | ICD-10-CM | POA: Diagnosis not present

## 2015-10-28 DIAGNOSIS — M329 Systemic lupus erythematosus, unspecified: Secondary | ICD-10-CM | POA: Diagnosis not present

## 2015-10-28 DIAGNOSIS — R110 Nausea: Secondary | ICD-10-CM | POA: Diagnosis not present

## 2015-10-28 DIAGNOSIS — Z96641 Presence of right artificial hip joint: Secondary | ICD-10-CM | POA: Diagnosis present

## 2015-10-28 DIAGNOSIS — E0840 Diabetes mellitus due to underlying condition with diabetic neuropathy, unspecified: Secondary | ICD-10-CM | POA: Diagnosis not present

## 2015-10-28 DIAGNOSIS — G8929 Other chronic pain: Secondary | ICD-10-CM | POA: Diagnosis present

## 2015-10-28 DIAGNOSIS — A09 Infectious gastroenteritis and colitis, unspecified: Secondary | ICD-10-CM | POA: Diagnosis not present

## 2015-10-28 DIAGNOSIS — H9193 Unspecified hearing loss, bilateral: Secondary | ICD-10-CM | POA: Diagnosis not present

## 2015-10-28 DIAGNOSIS — K529 Noninfective gastroenteritis and colitis, unspecified: Secondary | ICD-10-CM | POA: Diagnosis not present

## 2015-10-28 DIAGNOSIS — E039 Hypothyroidism, unspecified: Secondary | ICD-10-CM | POA: Diagnosis present

## 2015-10-28 DIAGNOSIS — E785 Hyperlipidemia, unspecified: Secondary | ICD-10-CM | POA: Diagnosis not present

## 2015-10-28 DIAGNOSIS — G2581 Restless legs syndrome: Secondary | ICD-10-CM | POA: Diagnosis present

## 2015-10-28 DIAGNOSIS — M797 Fibromyalgia: Secondary | ICD-10-CM | POA: Diagnosis not present

## 2015-10-28 DIAGNOSIS — K5730 Diverticulosis of large intestine without perforation or abscess without bleeding: Secondary | ICD-10-CM | POA: Diagnosis not present

## 2015-10-28 DIAGNOSIS — F419 Anxiety disorder, unspecified: Secondary | ICD-10-CM | POA: Diagnosis present

## 2015-10-28 DIAGNOSIS — I10 Essential (primary) hypertension: Secondary | ICD-10-CM | POA: Diagnosis not present

## 2015-10-28 DIAGNOSIS — Z87442 Personal history of urinary calculi: Secondary | ICD-10-CM | POA: Diagnosis not present

## 2015-10-28 DIAGNOSIS — K559 Vascular disorder of intestine, unspecified: Secondary | ICD-10-CM | POA: Diagnosis not present

## 2015-10-28 DIAGNOSIS — F319 Bipolar disorder, unspecified: Secondary | ICD-10-CM | POA: Diagnosis not present

## 2015-10-28 DIAGNOSIS — M549 Dorsalgia, unspecified: Secondary | ICD-10-CM | POA: Diagnosis present

## 2015-10-28 DIAGNOSIS — M19042 Primary osteoarthritis, left hand: Secondary | ICD-10-CM | POA: Diagnosis present

## 2015-10-28 DIAGNOSIS — E1140 Type 2 diabetes mellitus with diabetic neuropathy, unspecified: Secondary | ICD-10-CM | POA: Diagnosis not present

## 2015-10-28 DIAGNOSIS — R109 Unspecified abdominal pain: Secondary | ICD-10-CM | POA: Diagnosis not present

## 2015-10-28 DIAGNOSIS — K589 Irritable bowel syndrome without diarrhea: Secondary | ICD-10-CM | POA: Diagnosis not present

## 2015-10-28 DIAGNOSIS — F1021 Alcohol dependence, in remission: Secondary | ICD-10-CM | POA: Diagnosis present

## 2015-10-28 DIAGNOSIS — Z90710 Acquired absence of both cervix and uterus: Secondary | ICD-10-CM | POA: Diagnosis not present

## 2015-10-28 DIAGNOSIS — K760 Fatty (change of) liver, not elsewhere classified: Secondary | ICD-10-CM | POA: Diagnosis not present

## 2015-10-28 DIAGNOSIS — M19041 Primary osteoarthritis, right hand: Secondary | ICD-10-CM | POA: Diagnosis present

## 2015-10-28 LAB — LACTIC ACID, PLASMA
Lactic Acid, Venous: 2.3 mmol/L (ref 0.5–1.9)
Lactic Acid, Venous: 1.9 mmol/L (ref 0.5–1.9)

## 2015-10-28 LAB — I-STAT CG4 LACTIC ACID, ED
Lactic Acid, Venous: 3.19 mmol/L (ref 0.5–1.9)
Lactic Acid, Venous: 2.06 mmol/L (ref 0.5–1.9)

## 2015-10-28 LAB — CBC WITH DIFFERENTIAL/PLATELET
Basophils Absolute: 0 10*3/uL (ref 0.0–0.1)
Basophils Relative: 0 %
Eosinophils Absolute: 0 10*3/uL (ref 0.0–0.7)
Eosinophils Relative: 0 %
HCT: 40.1 % (ref 36.0–46.0)
Hemoglobin: 13 g/dL (ref 12.0–15.0)
Lymphocytes Relative: 23 %
Lymphs Abs: 1.6 10*3/uL (ref 0.7–4.0)
MCH: 29.5 pg (ref 26.0–34.0)
MCHC: 32.4 g/dL (ref 30.0–36.0)
MCV: 91.1 fL (ref 78.0–100.0)
Monocytes Absolute: 0.5 10*3/uL (ref 0.1–1.0)
Monocytes Relative: 7 %
Neutro Abs: 4.9 10*3/uL (ref 1.7–7.7)
Neutrophils Relative %: 70 %
Platelets: 156 10*3/uL (ref 150–400)
RBC: 4.4 MIL/uL (ref 3.87–5.11)
RDW: 13.5 % (ref 11.5–15.5)
WBC: 7 10*3/uL (ref 4.0–10.5)

## 2015-10-28 LAB — GASTROINTESTINAL PANEL BY PCR, STOOL (REPLACES STOOL CULTURE)

## 2015-10-28 LAB — COMPREHENSIVE METABOLIC PANEL
ALT: 17 U/L (ref 14–54)
ALT: 21 U/L (ref 14–54)
AST: 18 U/L (ref 15–41)
AST: 20 U/L (ref 15–41)
Albumin: 3.2 g/dL — ABNORMAL LOW (ref 3.5–5.0)
Albumin: 3.8 g/dL (ref 3.5–5.0)
Alkaline Phosphatase: 76 U/L (ref 38–126)
Alkaline Phosphatase: 93 U/L (ref 38–126)
Anion gap: 6 (ref 5–15)
Anion gap: 8 (ref 5–15)
BUN: 13 mg/dL (ref 6–20)
BUN: 9 mg/dL (ref 6–20)
CO2: 23 mmol/L (ref 22–32)
CO2: 23 mmol/L (ref 22–32)
Calcium: 10.1 mg/dL (ref 8.9–10.3)
Calcium: 9 mg/dL (ref 8.9–10.3)
Chloride: 104 mmol/L (ref 101–111)
Chloride: 113 mmol/L — ABNORMAL HIGH (ref 101–111)
Creatinine, Ser: 0.77 mg/dL (ref 0.44–1.00)
Creatinine, Ser: 1.07 mg/dL — ABNORMAL HIGH (ref 0.44–1.00)
GFR calc Af Amer: >60 mL/min (ref >60)
GFR calc Af Amer: >60 mL/min (ref >60)
GFR calc non Af Amer: 52 mL/min — ABNORMAL LOW (ref >60)
GFR calc non Af Amer: >60 mL/min (ref >60)
Glucose, Bld: 293 mg/dL — ABNORMAL HIGH (ref 65–99)
Glucose, Bld: 388 mg/dL — ABNORMAL HIGH (ref 65–99)
Potassium: 4.3 mmol/L (ref 3.5–5.1)
Potassium: 4.7 mmol/L (ref 3.5–5.1)
Sodium: 135 mmol/L (ref 135–145)
Sodium: 142 mmol/L (ref 135–145)
Total Bilirubin: 0.2 mg/dL — ABNORMAL LOW (ref 0.3–1.2)
Total Bilirubin: 0.7 mg/dL (ref 0.3–1.2)
Total Protein: 5.8 g/dL — ABNORMAL LOW (ref 6.5–8.1)
Total Protein: 6.8 g/dL (ref 6.5–8.1)

## 2015-10-28 LAB — C DIFFICILE QUICK SCREEN W PCR REFLEX
C Diff antigen: NEGATIVE
C Diff interpretation: NOT DETECTED
C Diff toxin: NEGATIVE

## 2015-10-28 LAB — URINE MICROSCOPIC-ADD ON

## 2015-10-28 LAB — URINALYSIS, ROUTINE W REFLEX MICROSCOPIC
Bilirubin Urine: NEGATIVE
Glucose, UA: >1000 mg/dL — AB
Hgb urine dipstick: NEGATIVE
Ketones, ur: 15 mg/dL — AB
Leukocytes, UA: NEGATIVE
Nitrite: NEGATIVE
Protein, ur: NEGATIVE mg/dL
Specific Gravity, Urine: 1.018 (ref 1.005–1.030)
pH: 7 (ref 5.0–8.0)

## 2015-10-28 LAB — LITHIUM LEVEL: Lithium Lvl: 0.56 mmol/L — ABNORMAL LOW (ref 0.60–1.20)

## 2015-10-28 LAB — LIPASE, BLOOD: Lipase: 27 U/L (ref 11–51)

## 2015-10-28 LAB — GLUCOSE, CAPILLARY
Glucose-Capillary: 277 mg/dL — ABNORMAL HIGH (ref 65–99)
Glucose-Capillary: 260 mg/dL — ABNORMAL HIGH (ref 65–99)
Glucose-Capillary: 261 mg/dL — ABNORMAL HIGH (ref 65–99)
Glucose-Capillary: 207 mg/dL — ABNORMAL HIGH (ref 65–99)

## 2015-10-28 LAB — CBC
HCT: 43.5 % (ref 36.0–46.0)
Hemoglobin: 14.5 g/dL (ref 12.0–15.0)
MCH: 29.5 pg (ref 26.0–34.0)
MCHC: 33.3 g/dL (ref 30.0–36.0)
MCV: 88.4 fL (ref 78.0–100.0)
Platelets: 171 10*3/uL (ref 150–400)
RBC: 4.92 MIL/uL (ref 3.87–5.11)
RDW: 13.2 % (ref 11.5–15.5)
WBC: 8.1 10*3/uL (ref 4.0–10.5)

## 2015-10-28 LAB — VALPROIC ACID LEVEL: Valproic Acid Lvl: 52 ug/mL (ref 50.0–100.0)

## 2015-10-28 LAB — POC OCCULT BLOOD, ED: Fecal Occult Bld: NEGATIVE

## 2015-10-28 MED ORDER — DEXTROSE 5 % IV SOLN
2.0000 g | Freq: Once | INTRAVENOUS | Status: AC
  Administered 2015-10-28: 2 g via INTRAVENOUS
  Filled 2015-10-28: qty 2

## 2015-10-28 MED ORDER — MORPHINE SULFATE (PF) 2 MG/ML IV SOLN
1.0000 mg | INTRAVENOUS | Status: DC | PRN
  Administered 2015-10-28: 1 mg via INTRAVENOUS
  Filled 2015-10-28: qty 1

## 2015-10-28 MED ORDER — METRONIDAZOLE IN NACL 5-0.79 MG/ML-% IV SOLN
500.0000 mg | Freq: Once | INTRAVENOUS | Status: AC
  Administered 2015-10-28: 500 mg via INTRAVENOUS
  Filled 2015-10-28: qty 100

## 2015-10-28 MED ORDER — MORPHINE SULFATE (PF) 2 MG/ML IV SOLN
1.0000 mg | INTRAVENOUS | Status: DC | PRN
  Administered 2015-10-28 – 2015-10-29 (×6): 2 mg via INTRAVENOUS
  Administered 2015-10-29: 1 mg via INTRAVENOUS
  Administered 2015-10-29: 2 mg via INTRAVENOUS
  Administered 2015-10-30 – 2015-10-31 (×3): 1 mg via INTRAVENOUS
  Filled 2015-10-28 (×11): qty 1

## 2015-10-28 MED ORDER — DICYCLOMINE HCL 10 MG PO CAPS
10.0000 mg | ORAL_CAPSULE | Freq: Three times a day (TID) | ORAL | Status: DC
  Administered 2015-10-28 – 2015-10-29 (×3): 10 mg via ORAL
  Filled 2015-10-28 (×3): qty 1

## 2015-10-28 MED ORDER — DIVALPROEX SODIUM ER 500 MG PO TB24
750.0000 mg | ORAL_TABLET | Freq: Every day | ORAL | Status: DC
  Administered 2015-10-28 – 2015-10-30 (×3): 750 mg via ORAL
  Filled 2015-10-28 (×3): qty 1

## 2015-10-28 MED ORDER — ROSUVASTATIN CALCIUM 10 MG PO TABS
10.0000 mg | ORAL_TABLET | Freq: Every day | ORAL | Status: DC
  Administered 2015-10-28 – 2015-10-31 (×4): 10 mg via ORAL
  Filled 2015-10-28 (×4): qty 1

## 2015-10-28 MED ORDER — IOPAMIDOL (ISOVUE-370) INJECTION 76%
100.0000 mL | Freq: Once | INTRAVENOUS | Status: AC | PRN
  Administered 2015-10-28: 100 mL via INTRAVENOUS

## 2015-10-28 MED ORDER — HYDROMORPHONE HCL 1 MG/ML IJ SOLN
1.0000 mg | Freq: Once | INTRAMUSCULAR | Status: AC
  Administered 2015-10-28: 1 mg via INTRAVENOUS
  Filled 2015-10-28: qty 1

## 2015-10-28 MED ORDER — INSULIN ASPART 100 UNIT/ML ~~LOC~~ SOLN
0.0000 [IU] | Freq: Three times a day (TID) | SUBCUTANEOUS | Status: DC
  Administered 2015-10-28 – 2015-10-29 (×4): 5 [IU] via SUBCUTANEOUS
  Administered 2015-10-29 (×2): 3 [IU] via SUBCUTANEOUS
  Administered 2015-10-30 (×3): 2 [IU] via SUBCUTANEOUS
  Administered 2015-10-31: 1 [IU] via SUBCUTANEOUS

## 2015-10-28 MED ORDER — ACETAMINOPHEN 325 MG PO TABS
650.0000 mg | ORAL_TABLET | Freq: Four times a day (QID) | ORAL | Status: DC | PRN
Start: 2015-10-28 — End: 2015-10-31
  Administered 2015-10-29 – 2015-10-30 (×6): 650 mg via ORAL
  Filled 2015-10-28 (×6): qty 2

## 2015-10-28 MED ORDER — LORAZEPAM 0.5 MG PO TABS
0.5000 mg | ORAL_TABLET | Freq: Four times a day (QID) | ORAL | Status: DC | PRN
  Administered 2015-10-28 – 2015-10-29 (×2): 1 mg via ORAL
  Filled 2015-10-28 (×2): qty 2

## 2015-10-28 MED ORDER — SODIUM CHLORIDE 0.9 % IV BOLUS (SEPSIS)
1000.0000 mL | Freq: Once | INTRAVENOUS | Status: AC
  Administered 2015-10-28: 1000 mL via INTRAVENOUS

## 2015-10-28 MED ORDER — ADULT MULTIVITAMIN W/MINERALS CH
1.0000 | ORAL_TABLET | Freq: Every morning | ORAL | Status: DC
  Administered 2015-10-28 – 2015-10-31 (×4): 1 via ORAL
  Filled 2015-10-28 (×4): qty 1

## 2015-10-28 MED ORDER — ROPINIROLE HCL 1 MG PO TABS
1.5000 mg | ORAL_TABLET | ORAL | Status: DC
  Administered 2015-10-28 – 2015-10-30 (×3): 1.5 mg via ORAL
  Filled 2015-10-28 (×3): qty 1

## 2015-10-28 MED ORDER — ONDANSETRON HCL 4 MG PO TABS: 4.0000 mg | ORAL_TABLET | Freq: Four times a day (QID) | ORAL | Status: DC | PRN

## 2015-10-28 MED ORDER — SODIUM CHLORIDE 0.9 % IV SOLN
INTRAVENOUS | Status: DC
  Administered 2015-10-28 – 2015-10-30 (×5): via INTRAVENOUS

## 2015-10-28 MED ORDER — GABAPENTIN 100 MG PO CAPS
100.0000 mg | ORAL_CAPSULE | ORAL | Status: DC
  Administered 2015-10-28 – 2015-10-31 (×6): 100 mg via ORAL
  Filled 2015-10-28 (×7): qty 1

## 2015-10-28 MED ORDER — LEVOTHYROXINE SODIUM 75 MCG PO TABS
75.0000 ug | ORAL_TABLET | Freq: Every day | ORAL | Status: DC
  Administered 2015-10-28 – 2015-10-31 (×4): 75 ug via ORAL
  Filled 2015-10-28 (×4): qty 1

## 2015-10-28 MED ORDER — MORPHINE SULFATE (PF) 2 MG/ML IV SOLN
2.0000 mg | Freq: Once | INTRAVENOUS | Status: DC
  Administered 2015-10-28: 2 mg via INTRAVENOUS
  Filled 2015-10-28: qty 1

## 2015-10-28 MED ORDER — LITHIUM CARBONATE 300 MG PO CAPS
600.0000 mg | ORAL_CAPSULE | Freq: Every day | ORAL | Status: DC
  Administered 2015-10-28 – 2015-10-30 (×3): 600 mg via ORAL
  Filled 2015-10-28 (×3): qty 2

## 2015-10-28 MED ORDER — DEXTROSE 5 % IV SOLN
2.0000 g | INTRAVENOUS | Status: DC
  Administered 2015-10-28 – 2015-10-30 (×3): 2 g via INTRAVENOUS
  Filled 2015-10-28 (×3): qty 2

## 2015-10-28 MED ORDER — SODIUM CHLORIDE 0.9 % IV SOLN
INTRAVENOUS | Status: DC
  Administered 2015-10-28: 100 mL/h via INTRAVENOUS

## 2015-10-28 MED ORDER — NALTREXONE HCL 50 MG PO TABS
50.0000 mg | ORAL_TABLET | Freq: Every evening | ORAL | Status: DC
  Administered 2015-10-28 – 2015-10-30 (×3): 50 mg via ORAL
  Filled 2015-10-28 (×3): qty 1

## 2015-10-28 MED ORDER — ROPINIROLE HCL 1 MG PO TABS
2.0000 mg | ORAL_TABLET | ORAL | Status: DC
  Administered 2015-10-28 – 2015-10-30 (×3): 2 mg via ORAL
  Filled 2015-10-28 (×5): qty 2

## 2015-10-28 MED ORDER — ONDANSETRON HCL 4 MG/2ML IJ SOLN
4.0000 mg | Freq: Four times a day (QID) | INTRAMUSCULAR | Status: DC | PRN
  Administered 2015-10-28 – 2015-10-31 (×5): 4 mg via INTRAVENOUS
  Filled 2015-10-28 (×5): qty 2

## 2015-10-28 MED ORDER — QUETIAPINE FUMARATE 100 MG PO TABS
100.0000 mg | ORAL_TABLET | ORAL | Status: DC
  Administered 2015-10-28 – 2015-10-30 (×3): 100 mg via ORAL
  Filled 2015-10-28 (×3): qty 1

## 2015-10-28 MED ORDER — QUETIAPINE FUMARATE 100 MG PO TABS
200.0000 mg | ORAL_TABLET | ORAL | Status: DC
  Administered 2015-10-28 – 2015-10-30 (×3): 200 mg via ORAL
  Filled 2015-10-28 (×3): qty 2

## 2015-10-28 MED ORDER — METRONIDAZOLE IN NACL 5-0.79 MG/ML-% IV SOLN
500.0000 mg | Freq: Three times a day (TID) | INTRAVENOUS | Status: DC
  Administered 2015-10-28 – 2015-10-30 (×6): 500 mg via INTRAVENOUS
  Filled 2015-10-28 (×7): qty 100

## 2015-10-28 MED ORDER — ACETAMINOPHEN 650 MG RE SUPP: 650.0000 mg | Freq: Four times a day (QID) | RECTAL | Status: DC | PRN

## 2015-10-28 NOTE — Progress Notes (Signed)
PROGRESS NOTE    Kimberlee Sicking  [id number] DOB: [birth year] DOA: 10/27/2015 PCP: London Pepper, MD    Brief Narrative: Auline Kuyper is a 70 y.o. female with diabetes mellitus type 2, hypothyroidism, bipolar disorder and restless leg syndrome presents to the ER with multiple episodes of diarrhea and abdominal cramps since last evening. Denies any recent sick contacts or travel. Denies using any antibiotics recently. Patient also noticed some blood-tinged diarrhea. Denies any vomiting. Pain started initially in the right side and moved to the left. On exam patient has diffuse tenderness. CT abdomen and pelvis shows possible inflammatory changes around the ileum and sigmoid. Lactic acid was elevated which improved with fluid bolus. Patient is being admitted for further hydration and management of diarrhea with possible colitis.    Assessment & Plan:   Principal Problem:   Colitis Active Problems:   Diabetes mellitus with diabetic neuropathy, without long-term current use of insulin ([place])   Hypertension   Bipolar 1 disorder ([place])     1. Colitis - inflammatory versus infectious. Continue with IV antibiotics, IV fluids, GI consulted.  2. Diabetes mellitus type 2 - will keep patient on sliding scale coverage. Hold Glucotrol while inpatient. 3. Hypothyroidism on Synthroid. 4. Bipolar disorder on Depakote and lithium - Depakote normal, lithium low.  5. Restless leg syndrome on Requip -  6. History of alcohol abuse in remission.    DVT prophylaxis: scd Code Status: Full code.  Family Communication: husband at bedside.  Disposition Plan: remain inpatient   Consultants:   GI  Procedures:  none  Antimicrobials:   Ceftriaxone.   Flagyl    Subjective: Still with multiples episodes of diarrhea, bloody, cramping abdominal pain   Objective: Vitals:   10/27/15 2302 10/28/15 0134 10/28/15 0410 10/28/15 0600  BP:  136/69 145/80 (!) 153/89  Pulse:  81 95 97  Resp:  20  20 18   Temp:  98.8 F (37.1 C) 97.5 F (36.4 C) 98 F (36.7 C)  TempSrc:  Oral Oral Oral  SpO2:  96% 95% 100%  Weight: 77.1 kg (170 lb)   78.8 kg (173 lb 11.2 oz)  Height: 5\' 2"  (1.575 m)   5\' 2"  (1.575 m)    Intake/Output Summary (Last 24 hours) at 10/28/15 1005 Last data filed at 10/28/15 0700  Gross per 24 hour  Intake                0 ml  Output              850 ml  Net             -850 ml   Filed Weights   10/27/15 2302 10/28/15 0600  Weight: 77.1 kg (170 lb) 78.8 kg (173 lb 11.2 oz)    Examination:  General exam: Appears calm and comfortable  Respiratory system: Clear to auscultation. Respiratory effort normal. Cardiovascular system: S1 & S2 heard, RRR. No JVD, murmurs, rubs, gallops or clicks. No pedal edema. Gastrointestinal system: Abdomen is nondistended, soft and nontender. No organomegaly or masses felt. Normal bowel sounds heard. Central nervous system: Alert and oriented. No focal neurological deficits. Extremities: Symmetric 5 x 5 power. Skin: No rashes, lesions or ulcers Psychiatry: Judgement and insight appear normal. Mood & affect appropriate.     Data Reviewed: I have personally reviewed following labs and imaging studies  CBC:  Recent Labs Lab 10/27/15 2350 10/28/15 0617  WBC 8.1 7.0  NEUTROABS  --  4.9  HGB 14.5 13.0  HCT  43.5 40.1  MCV 88.4 91.1  PLT 171 [id number]   Basic Metabolic Panel:  Recent Labs Lab 10/27/15 2350 10/28/15 0617  NA 135 142  K 4.7 4.3  CL 104 113*  CO2 23 23  GLUCOSE 388* 293*  BUN 13 9  CREATININE 1.07* 0.77  CALCIUM 10.1 9.0   GFR: Estimated Creatinine Clearance: 66.4 mL/min (by C-G formula based on SCr of 0.8 mg/dL). Liver Function Tests:  Recent Labs Lab 10/27/15 2350 10/28/15 0617  AST 20 18  ALT 21 17  ALKPHOS 93 76  BILITOT 0.7 0.2*  PROT 6.8 5.8*  ALBUMIN 3.8 3.2*    Recent Labs Lab 10/27/15 2350  LIPASE 27   No results for input(s): AMMONIA in the last 168 hours. Coagulation  Profile: No results for input(s): INR, PROTIME in the last 168 hours. Cardiac Enzymes: No results for input(s): CKTOTAL, CKMB, CKMBINDEX, TROPONINI in the last 168 hours. BNP (last 3 results) No results for input(s): PROBNP in the last 8760 hours. HbA1C: No results for input(s): HGBA1C in the last 72 hours. CBG:  Recent Labs Lab 10/28/15 0750  GLUCAP 277*   Lipid Profile: No results for input(s): CHOL, HDL, LDLCALC, TRIG, CHOLHDL, LDLDIRECT in the last 72 hours. Thyroid Function Tests: No results for input(s): TSH, T4TOTAL, FREET4, T3FREE, THYROIDAB in the last 72 hours. Anemia Panel: No results for input(s): VITAMINB12, FOLATE, FERRITIN, TIBC, IRON, RETICCTPCT in the last 72 hours. Sepsis Labs:  Recent Labs Lab 10/28/15 0130 10/28/15 0311 10/28/15 0617 10/28/15 0849  LATICACIDVEN 3.19* 2.06* 2.3* 1.9    Recent Results (from the past 240 hour(s))  C difficile quick scan w PCR reflex     Status: None   Collection Time: 10/28/15  7:22 AM  Result Value Ref Range Status   C Diff antigen NEGATIVE NEGATIVE Final   C Diff toxin NEGATIVE NEGATIVE Final   C Diff interpretation No C. difficile detected.  Final         Radiology Studies: Ct Angio Abd/pel W And/or Wo Contrast  Result Date: 10/28/2015 CLINICAL DATA:  Increasing lower abdominal cramping and pain. Elevated lactate. EXAM: CTA ABDOMEN AND PELVIS WITHOUT AND WITH CONTRAST TECHNIQUE: Multidetector CT imaging of the abdomen and pelvis was performed using the standard protocol during bolus administration of intravenous contrast. Multiplanar reconstructed images and MIPs were obtained and reviewed to evaluate the vascular anatomy. CONTRAST:  100 cc Isovue 370 IV COMPARISON:  CT 12/31/2014, additional prior exams reviewed FINDINGS: VASCULAR Aorta: Normal in caliber. No dissection or aneurysm. No significant atherosclerosis. No acute aortic syndrome. Celiac: Patent, normal in caliber.  No abrupt occlusion. SMA: Patent, normal  in caliber.  No abrupt occlusion. Renals: Single bilateral, patent. IMA: Patent. Inflow: Patent, normal in caliber.  Mildly tortuous. Proximal Outflow: Patent, normal in caliber. Veins: Mesenteric veins are patent. No thrombus. Portal venous system is patent. Review of the MIP images confirms the above findings. NON-VASCULAR Lower chest: Left lower lobe 5 mm pulmonary nodule stable dating back to 2014 and considered benign. Mild patchy and ground-glass opacities in the left lower lobe. Minimal focal fissural thickening involving the minor fissure. Mild peripheral atelectasis in the right lower lobe. Hepatobiliary: Decreased density consistent with steatosis. No focal lesion. Gallbladder minimally distended, no calcified stone. Pancreas: No ductal dilatation or inflammation. Spleen: Normal in size.  No focal abnormality. Adrenals/Urinary Tract: Normal adrenal glands. Chronic right hydronephrosis and dilatation of the renal pelvis with transition at the ureteropelvic junction exam unchanged from prior. Associated thinning of  the right renal cortex. No perinephric edema. No left hydronephrosis. Stomach/Bowel: Stomach physiologically distended. Equivocal small bowel wall thickening involving distal ileum. Terminal ileum appears normal. Majority of the colon is decompressed. Equivocal wall thickening involving the sigmoid colon. Scattered sigmoid colonic diverticular without acute diverticulitis. Lymphatic: Small perihepatic nodes are unchanged. No retroperitoneal, mesenteric, or pelvic adenopathy. Reproductive: Uterus is surgically absent. There is no adnexal mass. Musculoskeletal: There are no acute or suspicious osseous abnormalities. Scattered degenerative change in the spine. Right hip prosthesis. Other: No free air or free fluid.  No mesenteric edema. IMPRESSION: Vascular No acute abnormality.  Patent mesenteric vasculature. Non Vascular 1. Equivocal wall thickening involving the sigmoid colon, may reflect mild  colitis. Similar equivocal wall thickening involving distal ileal bowel loops in the right lower quadrant. This may be infectious or inflammatory. Distribution does not suggest a vascular etiology. 2. Patchy and ground-glass opacities in the left lower lobe in the included lung bases, may be infectious or inflammatory. 3. Stable chronic findings include hepatic steatosis, congenital right UPJ stenosis, and sigmoid colonic diverticulosis without acute diverticulitis. Electronically Signed   By: Jeb Levering M.D.   On: 10/28/2015 02:30        Scheduled Meds: . cefTRIAXone (ROCEPHIN)  IV  2 g Intravenous Q24H  . divalproex  750 mg Oral QHS  . gabapentin  100 mg Oral 2 times per day  . insulin aspart  0-9 Units Subcutaneous TID WC  . levothyroxine  75 mcg Oral QAC breakfast  . lithium carbonate  600 mg Oral QHS  . metronidazole  500 mg Intravenous Q8H  . multivitamin with minerals  1 tablet Oral q morning - 10a  . naltrexone  50 mg Oral QPM  . QUEtiapine  100 mg Oral Q24H  . QUEtiapine  200 mg Oral Q24H  . rOPINIRole  1.5 mg Oral Q24H  . rOPINIRole  2 mg Oral Q24H  . rosuvastatin  10 mg Oral Daily   Continuous Infusions: . sodium chloride    . sodium chloride 100 mL/hr (10/28/15 0600)     LOS: 0 days    Time spent: 25 minutes.     Elmarie Shiley, MD [facility] Pager [phone]  If 7PM-7AM, please contact night-coverage [email or web address] Password [email or web address] 10/28/2015, 10:05 AM

## 2015-10-28 NOTE — Progress Notes (Signed)
2Pharmacy Antibiotic Note  [name] [name] is a 70 y.o. female admitted on 10/27/2015 with Intra-abdominal infection.  Pharmacy has been consulted for Cefriaxone dosing.  Plan: Ceftriaxone 2gm iv q24hr  Height: 5\' 2"  (157.5 cm) Weight: 170 lb (77.1 kg) IBW/kg (Calculated) : 50.1  Temp (24hrs), Avg:98.1 F (36.7 C), Min:97.5 F (36.4 C), Max:98.8 F (37.1 C)   Recent Labs Lab 10/27/15 2350 10/28/15 0130 10/28/15 0311  WBC 8.1  --   --   CREATININE 1.07*  --   --   LATICACIDVEN  --  3.19* 2.06*    Estimated Creatinine Clearance: 49.1 mL/min (by C-G formula based on SCr of 1.07 mg/dL).    Allergies  Allergen Reactions  . Codeine Anxiety and Other (See Comments)    hallucinations  . Macrolides And Ketolides Nausea And Vomiting    All mycins  . Procaine Hcl Palpitations  . Dilaudid [Hydromorphone Hcl]   . Sulfa Antibiotics Other (See Comments)    Antimicrobials this admission: Ceftriaxone 8/21 >> Flagyl 8/21  Dose adjustments this admission: -  Microbiology results: pending  Thank you for allowing pharmacy to be a part of this patient's care.  Nani Skillern Crowford 10/28/2015 6:26 AM

## 2015-10-28 NOTE — ED Notes (Signed)
No respiratory or acute distress noted alert and oriented x 3 call light in reach visitor at bedside. 

## 2015-10-28 NOTE — Progress Notes (Signed)
Inpatient Diabetes Program Recommendations  AACE/ADA: New Consensus Statement on Inpatient Glycemic Control (2015)  Target Ranges:  Prepandial:   less than 140 mg/dL      Peak postprandial:   less than 180 mg/dL (1-2 hours)      Critically ill patients:  140 - 180 mg/dL   Lab Results  Component Value Date   GLUCAP 277 (H) 10/28/2015   HGBA1C 6.2 (H) 06/09/2013    Review of Glycemic Control  Diabetes history: DM2 Outpatient Diabetes medications: glipizide 5 mg QD Current orders for Inpatient glycemic control: Novolog sensitive tidwc  Blood sugars in 200-300s.   Inpatient Diabetes Program Recommendations:    Check HgbA1C to assess glycemic control prior to admission Change Novolog to moderate Q4H until po intake increases. May need small amount of basal insulin.  Will follow. Thank you. Lorenda Peck, RD, LDN, CDE Inpatient Diabetes Coordinator [phone]

## 2015-10-28 NOTE — ED Notes (Signed)
No respiratory or acute distress noted no reaction to medication noted alert and oriented x 3 visitor at bedside call light in reach. 

## 2015-10-28 NOTE — H&P (Signed)
History and Physical    [name] [id number] DOB: Jul 31, 1945 DOA: 10/27/2015  PCP: London Pepper, MD  Patient coming from: Home.  Chief Complaint: Diarrhea and abdominal cramps.  HPI: [name] [name] is a 69 y.o. female with diabetes mellitus type 2, hypothyroidism, bipolar disorder and restless leg syndrome presents to the ER with multiple episodes of diarrhea and abdominal cramps since last evening. Denies any recent sick contacts or travel. Denies using any antibiotics recently. Patient also noticed some blood-tinged diarrhea. Denies any vomiting. Pain started initially in the right side and moved to the left. On exam patient has diffuse tenderness. CT abdomen and pelvis shows possible inflammatory changes around the ileum and sigmoid. Lactic acid was elevated which improved with fluid bolus. Patient is being admitted for further hydration and management of diarrhea with possible colitis.   ED Course: Patient was given fluid bolus in the ER and started on ceftriaxone and Flagyl.  Review of Systems: As per HPI, rest all negative.   Past Medical History:  Diagnosis Date  . Alcohol abuse   . Anxiety    takes Valium daily as needed and Ativan daily  . Bilateral hearing loss   . Bipolar 1 disorder ([place])    takes Lithium nightly and Synthroid daily  . Chronic back pain    DDD  . Colitis, ischemic ([name] [name]) 2012  . Confusion    r/t meds  . Depression    takes Prozac daily and Bupspirone   . Diabetes ([name] [name])   . Diabetes mellitus without complication ([name] [name])    takes Janumet daily  . Diverticulosis   . Diverticulosis   . Dyslipidemia    takes Crestor daily  . Fibromyalgia   . Hepatic steatosis 06/18/12   severe  . History of bronchitis    last time at least 73yrs ago  . Hyperlipidemia   . Hypothyroidism   . IBS (irritable bowel syndrome)   . Ischemic colitis ([name] [name])   . Joint pain   . Joint swelling   . Lupus ([name] [name])    tumid-skin  . Nephrolithiasis   . Numbness    in  right foot  . Osteoarthritis    in hips  . Osteoarthritis cervical spine   . Osteoarthritis of hand    bilateral  . Restless leg syndrome    takes Requip nightly  . Sciatica   . Urinary frequency   . Urinary leakage   . Urinary urgency   . Urinary, incontinence, stress female   . Walking pneumonia    last time more than 70yrs ago    Past Surgical History:  Procedure Laterality Date  . ABDOMINAL HYSTERECTOMY    . APPENDECTOMY    . COLONOSCOPY    . D&C x 4    . DENTAL SURGERY Left 2 weeks ago    dental implant  . ESOPHAGOGASTRODUODENOSCOPY    . excision of tumors at right neck     angle of jaw '68, benign  . FLEXIBLE SIGMOIDOSCOPY N/A 06/21/2012   Procedure: FLEXIBLE SIGMOIDOSCOPY;  Surgeon: Jerene Bears, MD;  Location: [facility];  Service: Gastroenterology;  Laterality: N/A;  . TONSILLECTOMY    . TOTAL HIP ARTHROPLASTY Right 06/09/2013   Procedure: TOTAL HIP ARTHROPLASTY;  Surgeon: Kerin Salen, MD;  Location: [place];  Service: Orthopedics;  Laterality: Right;  . TUBAL LIGATION       reports that she has never smoked. She has never used smokeless tobacco. She reports that she does not drink alcohol or  use drugs.  Allergies  Allergen Reactions  . Codeine Anxiety and Other (See Comments)    hallucinations  . Macrolides And Ketolides Nausea And Vomiting    All mycins  . Procaine Hcl Palpitations  . Dilaudid [Hydromorphone Hcl]   . Sulfa Antibiotics Other (See Comments)    Family History  Problem Relation Age of Onset  . Drug abuse Mother   . Alcohol abuse Mother   . Alcohol abuse Father   . Colon cancer Paternal Uncle   . Diabetes Brother   . Alcohol abuse Brother     x 2    Prior to Admission medications   Medication Sig Start Date End Date Taking? Authorizing Provider  acetaminophen (TYLENOL) 500 MG tablet Take 1,000 mg by mouth every 6 (six) hours as needed for mild pain, moderate pain, fever or headache.   Yes Historical Provider, MD  cholecalciferol  (VITAMIN D) 1000 units tablet Take 1,000 Units by mouth daily.   Yes Historical Provider, MD  divalproex (DEPAKOTE ER) 250 MG 24 hr tablet Take 750 mg by mouth at bedtime.  12/25/14  Yes Historical Provider, MD  gabapentin (NEURONTIN) 100 MG capsule Take 1 capsule (100 mg total) by mouth 3 (three) times daily. Patient taking differently: Take 100 mg by mouth 2 (two) times daily. Takes 100mg  at 0100 and 100mg  1730 07/06/15  Yes Charlynne Cousins, MD  glipiZIDE (GLUCOTROL XL) 5 MG 24 hr tablet Take 5 mg by mouth daily. 10/22/15  Yes Historical Provider, MD  hyoscyamine (LEVBID) 0.375 MG 12 hr tablet take 1 tablet by mouth daily before BREAKFAST 10/08/15  Yes [name], MD  L-methylfolate Calcium 15 MG TABS Take 1 tablet by mouth every morning. 10/21/15  Yes Historical Provider, MD  levothyroxine (LEVOTHROID) 75 MCG tablet Take 75 mcg by mouth daily before breakfast.    Yes Historical Provider, MD  lithium 300 MG tablet Take 600 mg by mouth at bedtime.    Yes Historical Provider, MD  LORazepam (ATIVAN) 0.5 MG tablet Take 0.5-1 mg by mouth every 6 (six) hours as needed for anxiety or sleep.  06/25/15  Yes Historical Provider, MD  naltrexone (DEPADE) 50 MG tablet Take 50 mg by mouth every evening. 10/21/15  Yes Historical Provider, MD  ondansetron (ZOFRAN) 4 MG tablet Take 1 tablet by mouth 2 (two) times daily as needed for nausea. 08/10/15  Yes Historical Provider, MD  QUEtiapine (SEROQUEL) 200 MG tablet Take 100-200 mg by mouth 2 (two) times daily. Takes 100mg  at 1700 and 200mg  at [place] 06/10/15  Yes Historical Provider, MD  rOPINIRole (REQUIP) 0.5 MG tablet Take 3 tablets (1.5 mg total) by mouth daily. Patient taking differently: Take 0.5 mg by mouth. Take 3 tablets by mouth at 5 pm and 4 tablets and 7:30 pm 07/06/15  Yes Charlynne Cousins, MD  rosuvastatin (CRESTOR) 10 MG tablet take 1 tablet by mouth once daily Patient taking differently: Take 10 mg by mouth daily.  01/12/14  Yes Janith Lima, MD     Physical Exam: Vitals:   10/27/15 2254 10/27/15 2302 10/28/15 0134 10/28/15 0410  BP: 154/80  136/69 145/80  Pulse: 107  81 95  Resp: 20  20 20   Temp: 98 F (36.7 C)  98.8 F (37.1 C) 97.5 F (36.4 C)  TempSrc: Oral  Oral Oral  SpO2: 96%  96% 95%  Weight:  170 lb (77.1 kg)    Height:  5\' 2"  (1.575 m)  Constitutional: Not in distress. Vitals:   10/27/15 2254 10/27/15 2302 10/28/15 0134 10/28/15 0410  BP: 154/80  136/69 145/80  Pulse: 107  81 95  Resp: 20  20 20   Temp: 98 F (36.7 C)  98.8 F (37.1 C) 97.5 F (36.4 C)  TempSrc: Oral  Oral Oral  SpO2: 96%  96% 95%  Weight:  170 lb (77.1 kg)    Height:  5\' 2"  (1.575 m)     Eyes: Anicteric no pallor. ENMT: No discharge from the ears eyes nose and mouth. Neck: No mass felt. No JVD appreciated. Respiratory: No rhonchi or crepitations. Cardiovascular: S1 and S2 heard. Abdomen: Diffuse tenderness most marked in the right lower quadrant and left lower quadrant. Musculoskeletal: No edema. Skin: No rash. Neurologic: Alert awake oriented to time place and person. Moves all extremities. Psychiatric: Appears normal.   Labs on Admission: I have personally reviewed following labs and imaging studies  CBC:  Recent Labs Lab 10/27/15 2350  WBC 8.1  HGB 14.5  HCT 43.5  MCV 88.4  PLT [id number]   Basic Metabolic Panel:  Recent Labs Lab 10/27/15 2350  NA 135  K 4.7  CL 104  CO2 23  GLUCOSE 388*  BUN 13  CREATININE 1.07*  CALCIUM 10.1   GFR: Estimated Creatinine Clearance: 49.1 mL/min (by C-G formula based on SCr of 1.07 mg/dL). Liver Function Tests:  Recent Labs Lab 10/27/15 2350  AST 20  ALT 21  ALKPHOS 93  BILITOT 0.7  PROT 6.8  ALBUMIN 3.8    Recent Labs Lab 10/27/15 2350  LIPASE 27   No results for input(s): AMMONIA in the last 168 hours. Coagulation Profile: No results for input(s): INR, PROTIME in the last 168 hours. Cardiac Enzymes: No results for input(s): CKTOTAL, CKMB, CKMBINDEX,  TROPONINI in the last 168 hours. BNP (last 3 results) No results for input(s): PROBNP in the last 8760 hours. HbA1C: No results for input(s): HGBA1C in the last 72 hours. CBG: No results for input(s): GLUCAP in the last 168 hours. Lipid Profile: No results for input(s): CHOL, HDL, LDLCALC, TRIG, CHOLHDL, LDLDIRECT in the last 72 hours. Thyroid Function Tests: No results for input(s): TSH, T4TOTAL, FREET4, T3FREE, THYROIDAB in the last 72 hours. Anemia Panel: No results for input(s): VITAMINB12, FOLATE, FERRITIN, TIBC, IRON, RETICCTPCT in the last 72 hours. Urine analysis:    Component Value Date/Time   COLORURINE YELLOW 10/27/2015 0108   APPEARANCEUR CLEAR 10/27/2015 0108   LABSPEC 1.018 10/27/2015 0108   PHURINE 7.0 10/27/2015 0108   GLUCOSEU >1000 (A) 10/27/2015 0108   GLUCOSEU Negative 09/28/2011 1634   HGBUR NEGATIVE 10/27/2015 0108   BILIRUBINUR NEGATIVE 10/27/2015 0108   BILIRUBINUR neg 09/18/2011 1606   KETONESUR 15 (A) 10/27/2015 0108   PROTEINUR NEGATIVE 10/27/2015 0108   UROBILINOGEN 0.2 12/30/2014 2000   NITRITE NEGATIVE 10/27/2015 0108   LEUKOCYTESUR NEGATIVE 10/27/2015 0108   Sepsis Labs: @LABRCNTIP (procalcitonin:4,lacticidven:4) )No results found for this or any previous visit (from the past 240 hour(s)).   Radiological Exams on Admission: Ct Angio Abd/pel W And/or Wo Contrast  Result Date: 10/28/2015 CLINICAL DATA:  Increasing lower abdominal cramping and pain. Elevated lactate. EXAM: CTA ABDOMEN AND PELVIS WITHOUT AND WITH CONTRAST TECHNIQUE: Multidetector CT imaging of the abdomen and pelvis was performed using the standard protocol during bolus administration of intravenous contrast. Multiplanar reconstructed images and MIPs were obtained and reviewed to evaluate the vascular anatomy. CONTRAST:  100 cc Isovue 370 IV COMPARISON:  CT 12/31/2014, additional prior exams  reviewed FINDINGS: VASCULAR Aorta: Normal in caliber. No dissection or aneurysm. No significant  atherosclerosis. No acute aortic syndrome. Celiac: Patent, normal in caliber.  No abrupt occlusion. SMA: Patent, normal in caliber.  No abrupt occlusion. Renals: Single bilateral, patent. IMA: Patent. Inflow: Patent, normal in caliber.  Mildly tortuous. Proximal Outflow: Patent, normal in caliber. Veins: Mesenteric veins are patent. No thrombus. Portal venous system is patent. Review of the MIP images confirms the above findings. NON-VASCULAR Lower chest: Left lower lobe 5 mm pulmonary nodule stable dating back to 2014 and considered benign. Mild patchy and ground-glass opacities in the left lower lobe. Minimal focal fissural thickening involving the minor fissure. Mild peripheral atelectasis in the right lower lobe. Hepatobiliary: Decreased density consistent with steatosis. No focal lesion. Gallbladder minimally distended, no calcified stone. Pancreas: No ductal dilatation or inflammation. Spleen: Normal in size.  No focal abnormality. Adrenals/Urinary Tract: Normal adrenal glands. Chronic right hydronephrosis and dilatation of the renal pelvis with transition at the ureteropelvic junction exam unchanged from prior. Associated thinning of the right renal cortex. No perinephric edema. No left hydronephrosis. Stomach/Bowel: Stomach physiologically distended. Equivocal small bowel wall thickening involving distal ileum. Terminal ileum appears normal. Majority of the colon is decompressed. Equivocal wall thickening involving the sigmoid colon. Scattered sigmoid colonic diverticular without acute diverticulitis. Lymphatic: Small perihepatic nodes are unchanged. No retroperitoneal, mesenteric, or pelvic adenopathy. Reproductive: Uterus is surgically absent. There is no adnexal mass. Musculoskeletal: There are no acute or suspicious osseous abnormalities. Scattered degenerative change in the spine. Right hip prosthesis. Other: No free air or free fluid.  No mesenteric edema. IMPRESSION: Vascular No acute abnormality.   Patent mesenteric vasculature. Non Vascular 1. Equivocal wall thickening involving the sigmoid colon, may reflect mild colitis. Similar equivocal wall thickening involving distal ileal bowel loops in the right lower quadrant. This may be infectious or inflammatory. Distribution does not suggest a vascular etiology. 2. Patchy and ground-glass opacities in the left lower lobe in the included lung bases, may be infectious or inflammatory. 3. Stable chronic findings include hepatic steatosis, congenital right UPJ stenosis, and sigmoid colonic diverticulosis without acute diverticulitis. Electronically Signed   By: Jeb Levering M.D.   On: 10/28/2015 02:30    Assessment/Plan Principal Problem:   Colitis Active Problems:   Diabetes mellitus with diabetic neuropathy, without long-term current use of insulin ([name])   Hypertension   Bipolar 1 disorder ([place])    1. Colitis - inflammatory versus infectious. I did discuss with radiologist Dr. Marisue Humble about the CAT scan findings. As per the radiology reports colitis is not in a vascular pattern. Patient has had previous appendectomy. At this time I have placed patient on clear liquid diet and on ceftriaxone and Flagyl which where already started in the ER. Check stool studies. If symptoms does not improve then may have to check for inflammatory bowel disease. Follow lactate levels. 2. Diabetes mellitus type 2 - will keep patient on sliding scale coverage. Hold Glucotrol while inpatient. 3. Hypothyroidism on Synthroid. 4. Bipolar disorder on Depakote and lithium - levels of which are pending. 5. Restless leg syndrome on Requip - dose was confirmed with patient. 6. History of alcohol abuse in remission.   DVT prophylaxis: SCDs. Code Status: Full code.  Family Communication: Discussed with patient.  Disposition Plan: Home.  Consults called: None.  Admission status: Observation.    Rise Patience MD [facility] Pager [phone].  If  7PM-7AM, please contact night-coverage [email or web address] Password [facility]  10/28/2015, 5:57 AM

## 2015-10-28 NOTE — ED Provider Notes (Signed)
[facility] DEPT Provider Note   CSN: [id number] Arrival date & time: 10/27/15  2228  By signing my name below, I, Irene Pap, attest that this documentation has been prepared under the direction and in the presence of Gloriann Loan, PA-C. Electronically Signed: Irene Pap, ED Scribe. 10/28/15. 12:50 AM.  History   Chief Complaint Chief Complaint  Patient presents with  . Abdominal Pain  . Rectal Bleeding   The history is provided by the patient. No language interpreter was used.  HPI Comments: [name] [name] is a 70 y.o. Female with a hx of hysterectomy, appendectomy, ischemic colitis, DM, diverticulosis, hepatic steatosis, alcohol abuse, IBS, and Lupus who presents to the Emergency Department complaining of gradually worsening, severe, sudden onset, diffuse, cramping abdominal pain onset 5 hours ago. Pt reports associated medium red blood in her stool and diarrhea x5. Pt states that she last ate chips, dip, and watermelon around 6 hours ago. Pt has not taken anything for her symptoms. She reports hx of similar symptoms several years ago when she was diagnosed with colitis. She denies sick contacts, recent antibiotic use, recent hospital admission, recent overseas travel, fever, nausea, vomiting, dysuria, hematuria, or vaginal discharge.   Past Medical History:  Diagnosis Date  . Alcohol abuse   . Anxiety    takes Valium daily as needed and Ativan daily  . Bilateral hearing loss   . Bipolar 1 disorder ([place])    takes Lithium nightly and Synthroid daily  . Chronic back pain    DDD  . Colitis, ischemic ([place]) 2012  . Confusion    r/t meds  . Depression    takes Prozac daily and Bupspirone   . Diabetes ([place])   . Diabetes mellitus without complication ([place])    takes Janumet daily  . Diverticulosis   . Diverticulosis   . Dyslipidemia    takes Crestor daily  . Fibromyalgia   . Hepatic steatosis 06/18/12   severe  . History of bronchitis    last time at least 56yrs ago  .  Hyperlipidemia   . Hypothyroidism   . IBS (irritable bowel syndrome)   . Ischemic colitis ([place])   . Joint pain   . Joint swelling   . Lupus ([place])    tumid-skin  . Nephrolithiasis   . Numbness    in right foot  . Osteoarthritis    in hips  . Osteoarthritis cervical spine   . Osteoarthritis of hand    bilateral  . Restless leg syndrome    takes Requip nightly  . Sciatica   . Urinary frequency   . Urinary leakage   . Urinary urgency   . Urinary, incontinence, stress female   . Walking pneumonia    last time more than 45yrs ago    Patient Active Problem List   Diagnosis Date Noted  . Colitis 10/28/2015  . Unintentional poisoning by psychotropic drug 07/05/2015  . Restless leg syndrome 07/05/2015  . Diabetic polyneuropathy associated with type 2 diabetes mellitus ([place]) 09/21/2014  . Arthritis of left hip 06/09/2013  . Arthritis of right hip 06/08/2013  . Hip pain, right 05/02/2013  . IBS (irritable bowel syndrome) 01/02/2013  . Unspecified vitamin D deficiency 07/05/2012  . Pure hyperglyceridemia 07/04/2012  . Diabetes mellitus with diabetic neuropathy, without long-term current use of insulin ([place]) 06/18/2012  . Hypertension 06/18/2012  . Bipolar 1 disorder ([place]) 06/18/2012  . Low back pain 02/25/2011  . Acute ischemic colitis ([place]) 01/06/2011  . Sciatica 12/19/2010  . Fibromyalgia  10/28/2010  . Primary osteoarthritis of right hip 10/24/2010  . Hyperlipidemia LDL goal < 100 05/05/2010  . DEPRESSION/ANXIETY 05/05/2010  . ABUSE, ALCOHOL, IN REMISSION 05/05/2010  . OTITIS MEDIA, CHRONIC 05/05/2010  . HEARING LOSS, BILATERAL 05/05/2010  . ALLERGIC RHINITIS, SEASONAL, MILD 05/05/2010  . URINARY INCONTINENCE, STRESS, FEMALE 05/05/2010  . OSTEOARTHRITIS, HANDS, BILATERAL 05/05/2010  . OSTEOARTHRITIS, CERVICAL SPINE 05/05/2010    Past Surgical History:  Procedure Laterality Date  . ABDOMINAL HYSTERECTOMY    . APPENDECTOMY    . COLONOSCOPY    . D&C x 4    . DENTAL  SURGERY Left 2 weeks ago    dental implant  . ESOPHAGOGASTRODUODENOSCOPY    . excision of tumors at right neck     angle of jaw '68, benign  . FLEXIBLE SIGMOIDOSCOPY N/A 06/21/2012   Procedure: FLEXIBLE SIGMOIDOSCOPY;  Surgeon: Jerene Bears, MD;  Location: [facility];  Service: Gastroenterology;  Laterality: N/A;  . TONSILLECTOMY    . TOTAL HIP ARTHROPLASTY Right 06/09/2013   Procedure: TOTAL HIP ARTHROPLASTY;  Surgeon: Kerin Salen, MD;  Location: [facility];  Service: Orthopedics;  Laterality: Right;  . TUBAL LIGATION      OB History    No data available       Home Medications    Prior to Admission medications   Medication Sig Start Date End Date Taking? Authorizing Provider  acetaminophen (TYLENOL) 500 MG tablet Take 1,000 mg by mouth every 6 (six) hours as needed for mild pain, moderate pain, fever or headache.   Yes Historical Provider, MD  cholecalciferol (VITAMIN D) 1000 units tablet Take 1,000 Units by mouth daily.   Yes Historical Provider, MD  divalproex (DEPAKOTE ER) 250 MG 24 hr tablet Take 750 mg by mouth at bedtime.  12/25/14  Yes Historical Provider, MD  gabapentin (NEURONTIN) 100 MG capsule Take 1 capsule (100 mg total) by mouth 3 (three) times daily. Patient taking differently: Take 100 mg by mouth 2 (two) times daily. Takes 100mg  at 0100 and 100mg  1730 07/06/15  Yes Charlynne Cousins, MD  glipiZIDE (GLUCOTROL XL) 5 MG 24 hr tablet Take 5 mg by mouth daily. 10/22/15  Yes Historical Provider, MD  hyoscyamine (LEVBID) 0.375 MG 12 hr tablet take 1 tablet by mouth daily before BREAKFAST 10/08/15  Yes [name], MD  L-methylfolate Calcium 15 MG TABS Take 1 tablet by mouth every morning. 10/21/15  Yes Historical Provider, MD  levothyroxine (LEVOTHROID) 75 MCG tablet Take 75 mcg by mouth daily before breakfast.    Yes Historical Provider, MD  lithium 300 MG tablet Take 600 mg by mouth at bedtime.    Yes Historical Provider, MD  LORazepam (ATIVAN) 0.5 MG tablet Take 0.5-1 mg  by mouth every 6 (six) hours as needed for anxiety or sleep.  06/25/15  Yes Historical Provider, MD  naltrexone (DEPADE) 50 MG tablet Take 50 mg by mouth every evening. 10/21/15  Yes Historical Provider, MD  ondansetron (ZOFRAN) 4 MG tablet Take 1 tablet by mouth 2 (two) times daily as needed for nausea. 08/10/15  Yes Historical Provider, MD  QUEtiapine (SEROQUEL) 200 MG tablet Take 100-200 mg by mouth 2 (two) times daily. Takes 100mg  at 1700 and 200mg  at [place] 06/10/15  Yes Historical Provider, MD  rOPINIRole (REQUIP) 0.5 MG tablet Take 3 tablets (1.5 mg total) by mouth daily. Patient taking differently: Take 0.5 mg by mouth. Take 3 tablets by mouth at 5 pm and 4 tablets and 7:30 pm 07/06/15  Yes Charlynne Cousins, MD  rosuvastatin (CRESTOR) 10 MG tablet take 1 tablet by mouth once daily Patient taking differently: Take 10 mg by mouth daily.  01/12/14  Yes Janith Lima, MD    Family History Family History  Problem Relation Age of Onset  . Drug abuse Mother   . Alcohol abuse Mother   . Alcohol abuse Father   . Colon cancer Paternal Uncle   . Diabetes Brother   . Alcohol abuse Brother     x 2    Social History Social History  Substance Use Topics  . Smoking status: Never Smoker  . Smokeless tobacco: Never Used  . Alcohol use No     Allergies   Codeine; Macrolides and ketolides; Procaine hcl; Dilaudid [hydromorphone hcl]; and Sulfa antibiotics   Review of Systems Review of Systems  Constitutional: Negative for fever.  Gastrointestinal: Positive for abdominal pain, blood in stool and diarrhea. Negative for nausea and vomiting.  Genitourinary: Negative for dysuria, hematuria and vaginal discharge.     Physical Exam Updated Vital Signs BP 136/69 (BP Location: Right Arm)   Pulse 81   Temp 98.8 F (37.1 C) (Oral)   Resp 20   Ht 5\' 2"  (1.575 m)   Wt 77.1 kg   SpO2 96%   BMI 31.09 kg/m   Physical Exam  Constitutional: She is oriented to person, place, and time. She appears  well-developed and well-nourished.  Non-toxic appearance. She does not have a sickly appearance. She does not appear ill.  HENT:  Head: Normocephalic and atraumatic.  Mouth/Throat: Oropharynx is clear and moist.  Eyes: Conjunctivae are normal.  Neck: Normal range of motion. Neck supple.  Cardiovascular: Normal rate and regular rhythm.   Pulmonary/Chest: Effort normal and breath sounds normal. No accessory muscle usage or stridor. No respiratory distress. She has no wheezes. She has no rhonchi. She has no rales.  Abdominal: Soft. Bowel sounds are normal. She exhibits no distension. There is generalized tenderness. There is rebound and guarding.  Generalized tenderness, worst in the lower abdomen  Genitourinary: Rectal exam shows guaiac negative stool.  Genitourinary Comments: Examination chaperoned by ED scribe. No gross blood with DRE; no palpable stool in rectal vault  Musculoskeletal: Normal range of motion.  Lymphadenopathy:    She has no cervical adenopathy.  Neurological: She is alert and oriented to person, place, and time.  Speech clear without dysarthria.  Skin: Skin is warm and dry.  Psychiatric: She has a normal mood and affect. Her behavior is normal.  Nursing note and vitals reviewed.    ED Treatments / Results  DIAGNOSTIC STUDIES: Oxygen Saturation is 96% on RA, normal by my interpretation.    COORDINATION OF CARE: 12:48 AM-Discussed treatment plan which includes labs and CT scan with pt at bedside and pt agreed to plan.    Labs (all labs ordered are listed, but only abnormal results are displayed) Labs Reviewed  COMPREHENSIVE METABOLIC PANEL - Abnormal; Notable for the following:       Result Value   Glucose, Bld 388 (*)    Creatinine, Ser 1.07 (*)    GFR calc non Af Amer 52 (*)    All other components within normal limits  URINALYSIS, ROUTINE W REFLEX MICROSCOPIC (NOT AT [facility]) - Abnormal; Notable for the following:    Glucose, UA >1000 (*)    Ketones, ur 15  (*)    All other components within normal limits  URINE MICROSCOPIC-ADD ON - Abnormal; Notable for the following:  Squamous Epithelial / LPF 0-5 (*)    Bacteria, UA RARE (*)    All other components within normal limits  I-STAT CG4 LACTIC ACID, ED - Abnormal; Notable for the following:    Lactic Acid, Venous 3.19 (*)    All other components within normal limits  I-STAT CG4 LACTIC ACID, ED - Abnormal; Notable for the following:    Lactic Acid, Venous 2.06 (*)    All other components within normal limits  LIPASE, BLOOD  CBC  POC OCCULT BLOOD, ED  I-STAT CG4 LACTIC ACID, ED    EKG  EKG Interpretation None       Radiology Ct Angio Abd/pel W And/or Wo Contrast  Result Date: 10/28/2015 CLINICAL DATA:  Increasing lower abdominal cramping and pain. Elevated lactate. EXAM: CTA ABDOMEN AND PELVIS WITHOUT AND WITH CONTRAST TECHNIQUE: Multidetector CT imaging of the abdomen and pelvis was performed using the standard protocol during bolus administration of intravenous contrast. Multiplanar reconstructed images and MIPs were obtained and reviewed to evaluate the vascular anatomy. CONTRAST:  100 cc Isovue 370 IV COMPARISON:  CT 12/31/2014, additional prior exams reviewed FINDINGS: VASCULAR Aorta: Normal in caliber. No dissection or aneurysm. No significant atherosclerosis. No acute aortic syndrome. Celiac: Patent, normal in caliber.  No abrupt occlusion. SMA: Patent, normal in caliber.  No abrupt occlusion. Renals: Single bilateral, patent. IMA: Patent. Inflow: Patent, normal in caliber.  Mildly tortuous. Proximal Outflow: Patent, normal in caliber. Veins: Mesenteric veins are patent. No thrombus. Portal venous system is patent. Review of the MIP images confirms the above findings. NON-VASCULAR Lower chest: Left lower lobe 5 mm pulmonary nodule stable dating back to 2014 and considered benign. Mild patchy and ground-glass opacities in the left lower lobe. Minimal focal fissural thickening involving  the minor fissure. Mild peripheral atelectasis in the right lower lobe. Hepatobiliary: Decreased density consistent with steatosis. No focal lesion. Gallbladder minimally distended, no calcified stone. Pancreas: No ductal dilatation or inflammation. Spleen: Normal in size.  No focal abnormality. Adrenals/Urinary Tract: Normal adrenal glands. Chronic right hydronephrosis and dilatation of the renal pelvis with transition at the ureteropelvic junction exam unchanged from prior. Associated thinning of the right renal cortex. No perinephric edema. No left hydronephrosis. Stomach/Bowel: Stomach physiologically distended. Equivocal small bowel wall thickening involving distal ileum. Terminal ileum appears normal. Majority of the colon is decompressed. Equivocal wall thickening involving the sigmoid colon. Scattered sigmoid colonic diverticular without acute diverticulitis. Lymphatic: Small perihepatic nodes are unchanged. No retroperitoneal, mesenteric, or pelvic adenopathy. Reproductive: Uterus is surgically absent. There is no adnexal mass. Musculoskeletal: There are no acute or suspicious osseous abnormalities. Scattered degenerative change in the spine. Right hip prosthesis. Other: No free air or free fluid.  No mesenteric edema. IMPRESSION: Vascular No acute abnormality.  Patent mesenteric vasculature. Non Vascular 1. Equivocal wall thickening involving the sigmoid colon, may reflect mild colitis. Similar equivocal wall thickening involving distal ileal bowel loops in the right lower quadrant. This may be infectious or inflammatory. Distribution does not suggest a vascular etiology. 2. Patchy and ground-glass opacities in the left lower lobe in the included lung bases, may be infectious or inflammatory. 3. Stable chronic findings include hepatic steatosis, congenital right UPJ stenosis, and sigmoid colonic diverticulosis without acute diverticulitis. Electronically Signed   By: Jeb Levering M.D.   On: 10/28/2015  02:30    Procedures Procedures (including critical care time)  Medications Ordered in ED Medications  cefTRIAXone (ROCEPHIN) 2 g in dextrose 5 % 50 mL IVPB (not administered)  And  metroNIDAZOLE (FLAGYL) IVPB 500 mg (not administered)  sodium chloride 0.9 % bolus 1,000 mL (not administered)  sodium chloride 0.9 % bolus 1,000 mL (0 mLs Intravenous Stopped 10/28/15 0221)  HYDROmorphone (DILAUDID) injection 1 mg (1 mg Intravenous Given 10/28/15 0118)  sodium chloride 0.9 % bolus 1,000 mL (0 mLs Intravenous Stopped 10/28/15 0302)  iopamidol (ISOVUE-370) 76 % injection 100 mL (100 mLs Intravenous Contrast Given 10/28/15 0156)     Initial Impression / Assessment and Plan / ED Course  I have reviewed the triage vital signs and the nursing notes.  Pertinent labs & imaging results that were available during my care of the patient were reviewed by me and considered in my medical decision making (see chart for details).  Clinical Course   Patient presents with sudden onset severe lower abdominal cramping with reported bloody diarrhea. No fever, no nausea or vomiting. VSS, NAD. On exam, patient with exquisite lower abdominal tenderness with rebound. Guaiac negative. Differential included mesenteric ischemia versus colitis. Initial lactate take acid elevated at 3.19 which decreased with 2 L IVF 2.06. Otherwise, lab work without acute abnormalities. CTA abd/pelvis did not show a vascular etiology, but did show mild colitis. Patient started on IV Rocephin and Flagyl per ED antibiotic order set. Will admit to medicine for IV antibiotics, fluids, and trending lactates.   Final Clinical Impressions(s) / ED Diagnoses   Final diagnoses:  Colitis  Elevated lactic acid level    New Prescriptions New Prescriptions   No medications on file   I personally performed the services described in this documentation, which was scribed in my presence. The recorded information has been reviewed and is  accurate.    Gloriann Loan, PA-C 10/28/15 [id number]    April Palumbo, MD 10/28/15 [phone]
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
## 2015-10-29 DIAGNOSIS — K529 Noninfective gastroenteritis and colitis, unspecified: Secondary | ICD-10-CM

## 2015-10-29 LAB — BASIC METABOLIC PANEL
Anion gap: 8 (ref 5–15)
BUN: 5 mg/dL — ABNORMAL LOW (ref 6–20)
CO2: 21 mmol/L — ABNORMAL LOW (ref 22–32)
Calcium: 9 mg/dL (ref 8.9–10.3)
Chloride: 109 mmol/L (ref 101–111)
Creatinine, Ser: 0.78 mg/dL (ref 0.44–1.00)
GFR calc Af Amer: >60 mL/min (ref >60)
GFR calc non Af Amer: >60 mL/min (ref >60)
Glucose, Bld: 250 mg/dL — ABNORMAL HIGH (ref 65–99)
Potassium: 4.2 mmol/L (ref 3.5–5.1)
Sodium: 138 mmol/L (ref 135–145)

## 2015-10-29 LAB — CBC
HCT: 39.7 % (ref 36.0–46.0)
Hemoglobin: 12.9 g/dL (ref 12.0–15.0)
MCH: 29.5 pg (ref 26.0–34.0)
MCHC: 32.5 g/dL (ref 30.0–36.0)
MCV: 90.8 fL (ref 78.0–100.0)
Platelets: 178 10*3/uL (ref 150–400)
RBC: 4.37 MIL/uL (ref 3.87–5.11)
RDW: 13.5 % (ref 11.5–15.5)
WBC: 8.4 10*3/uL (ref 4.0–10.5)

## 2015-10-29 LAB — GLUCOSE, CAPILLARY
Glucose-Capillary: 267 mg/dL — ABNORMAL HIGH (ref 65–99)
Glucose-Capillary: 247 mg/dL — ABNORMAL HIGH (ref 65–99)
Glucose-Capillary: 205 mg/dL — ABNORMAL HIGH (ref 65–99)
Glucose-Capillary: 174 mg/dL — ABNORMAL HIGH (ref 65–99)

## 2015-10-29 MED ORDER — LORAZEPAM 1 MG PO TABS
1.0000 mg | ORAL_TABLET | Freq: Three times a day (TID) | ORAL | Status: DC
  Administered 2015-10-29 – 2015-10-31 (×6): 1 mg via ORAL
  Filled 2015-10-29 (×6): qty 1

## 2015-10-29 MED ORDER — DICYCLOMINE HCL 10 MG PO CAPS
20.0000 mg | ORAL_CAPSULE | Freq: Three times a day (TID) | ORAL | Status: DC
  Administered 2015-10-29 – 2015-10-31 (×8): 20 mg via ORAL
  Filled 2015-10-29 (×8): qty 2

## 2015-10-29 NOTE — Plan of Care (Signed)
Problem: Nutrition: Goal: Adequate nutrition will be maintained Outcome: Progressing With assistance, patient will eat meals

## 2015-10-29 NOTE — Progress Notes (Signed)
Assessment unchanged, pt resting without distress. SRP, RN

## 2015-10-29 NOTE — Progress Notes (Signed)
Progress Note   Subjective  Chief Complaint: abdominal pain, hematochezia  Pt found laying in bed this morning, presistent abdominal pain, nausea and headache yesterday before falling asleep, did better this morning, but "everything is coming back".  Currently, pt unable to tolerate even clear liquids "very much" as this causes her stomach to spasm. Last BM was small last night, still some blood. Pt tells me there is a "lot of cramping" before BM and then she doesn't have a very "big one". Bentyl does seem to be helping. Complains of nausea, not sleeping well and anxiety. She states she is not receiving regular home dose/schedule of lorazepam.     Objective   Vital signs in last 24 hours: Temp:  [97.9 F (36.6 C)-98.4 F (36.9 C)] 98.4 F (36.9 C) (08/22 0500) Pulse Rate:  [84-101] 86 (08/22 0500) Resp:  [18] 18 (08/22 0500) BP: (135-156)/(68-75) 146/68 (08/22 0500) SpO2:  [94 %-96 %] 95 % (08/22 0500) Weight:  [172 lb 9.9 oz (78.3 kg)] 172 lb 9.9 oz (78.3 kg) (08/22 0500) Last BM Date: 10/28/15 General: Caucasian female in NAD Heart:  Regular rate and rhythm; no murmurs Lungs: Respirations even and unlabored, lungs CTA bilaterally Abdomen:  Soft, mod ttp b/l lower quadrants, mild distension, hyperactive bowel sounds Extremities:  Without edema. Neurologic:  Alert and oriented,  grossly normal neurologically. Psych:  Cooperative. Normal mood and affect.  Intake/Output from previous day: 08/21 0701 - 08/22 0700 In: 3511.3 [P.O.:1080; I.V.:2081.3; IV Piggyback:350] Out: 2800 [Urine:2800] Intake/Output this shift: Total I/O In: -  Out: 150 [Urine:150]  Lab Results:  Recent Labs  10/27/15 2350 10/28/15 0617 10/29/15 0540  WBC 8.1 7.0 8.4  HGB 14.5 13.0 12.9  HCT 43.5 40.1 39.7  PLT 171 156 178   BMET  Recent Labs  10/27/15 2350 10/28/15 0617 10/29/15 0540  NA 135 142 138  K 4.7 4.3 4.2  CL 104 113* 109  CO2 23 23 21*  GLUCOSE 388* 293* 250*  BUN 13 9 5*    CREATININE 1.07* 0.77 0.78  CALCIUM 10.1 9.0 9.0   LFT  Recent Labs  10/28/15 0617  PROT 5.8*  ALBUMIN 3.2*  AST 18  ALT 17  ALKPHOS 76  BILITOT 0.2*   PT/INR No results for input(s): LABPROT, INR in the last 72 hours.  Studies/Results: Ct Angio Abd/pel W And/or Wo Contrast  Result Date: 10/28/2015 CLINICAL DATA:  Increasing lower abdominal cramping and pain. Elevated lactate. EXAM: CTA ABDOMEN AND PELVIS WITHOUT AND WITH CONTRAST TECHNIQUE: Multidetector CT imaging of the abdomen and pelvis was performed using the standard protocol during bolus administration of intravenous contrast. Multiplanar reconstructed images and MIPs were obtained and reviewed to evaluate the vascular anatomy. CONTRAST:  100 cc Isovue 370 IV COMPARISON:  CT 12/31/2014, additional prior exams reviewed FINDINGS: VASCULAR Aorta: Normal in caliber. No dissection or aneurysm. No significant atherosclerosis. No acute aortic syndrome. Celiac: Patent, normal in caliber.  No abrupt occlusion. SMA: Patent, normal in caliber.  No abrupt occlusion. Renals: Single bilateral, patent. IMA: Patent. Inflow: Patent, normal in caliber.  Mildly tortuous. Proximal Outflow: Patent, normal in caliber. Veins: Mesenteric veins are patent. No thrombus. Portal venous system is patent. Review of the MIP images confirms the above findings. NON-VASCULAR Lower chest: Left lower lobe 5 mm pulmonary nodule stable dating back to 2014 and considered benign. Mild patchy and ground-glass opacities in the left lower lobe. Minimal focal fissural thickening involving the minor fissure. Mild peripheral atelectasis in the  right lower lobe. Hepatobiliary: Decreased density consistent with steatosis. No focal lesion. Gallbladder minimally distended, no calcified stone. Pancreas: No ductal dilatation or inflammation. Spleen: Normal in size.  No focal abnormality. Adrenals/Urinary Tract: Normal adrenal glands. Chronic right hydronephrosis and dilatation of the  renal pelvis with transition at the ureteropelvic junction exam unchanged from prior. Associated thinning of the right renal cortex. No perinephric edema. No left hydronephrosis. Stomach/Bowel: Stomach physiologically distended. Equivocal small bowel wall thickening involving distal ileum. Terminal ileum appears normal. Majority of the colon is decompressed. Equivocal wall thickening involving the sigmoid colon. Scattered sigmoid colonic diverticular without acute diverticulitis. Lymphatic: Small perihepatic nodes are unchanged. No retroperitoneal, mesenteric, or pelvic adenopathy. Reproductive: Uterus is surgically absent. There is no adnexal mass. Musculoskeletal: There are no acute or suspicious osseous abnormalities. Scattered degenerative change in the spine. Right hip prosthesis. Other: No free air or free fluid.  No mesenteric edema. IMPRESSION: Vascular No acute abnormality.  Patent mesenteric vasculature. Non Vascular 1. Equivocal wall thickening involving the sigmoid colon, may reflect mild colitis. Similar equivocal wall thickening involving distal ileal bowel loops in the right lower quadrant. This may be infectious or inflammatory. Distribution does not suggest a vascular etiology. 2. Patchy and ground-glass opacities in the left lower lobe in the included lung bases, may be infectious or inflammatory. 3. Stable chronic findings include hepatic steatosis, congenital right UPJ stenosis, and sigmoid colonic diverticulosis without acute diverticulitis. Electronically Signed   By: Jeb Levering M.D.   On: 10/28/2015 02:30       Assessment / Plan:   Assessment: 1. Abdominal pain: Suspected recurrent ischemic colitis, CT angiogram as above, stool studies negative, hemoglobin stable, small bowel movement last night, currently unable to tolerate even a small amount of clear liquids due to "stomach spasming", Bentyl does improve symptoms 2. Hematochezia: Blood seen last night with small bowel  movement, hemoglobin stable  Plan: 1. Continue antibiotics 2. Continue supportive care with IVF's, pain control and antiemetics 3. Do not believe patient will be able to tolerate prep for colonoscopy at this time, though she would likely benefit from further endoscopic investigation in the future. We will continue to observe the patient, this could be scheduled as an outpatient in the future once acute symptoms have resolved 4. Will discuss above with Dr. Fuller Plan  Thank you for your kind consultation, we will continue to follow   LOS: 1 day   Levin Erp  10/29/2015, 9:32 AM  Pager # [phone]     Attending physician's note   I have taken an interval history, reviewed the chart and examined the patient. I agree with the Advanced Practitioner's note, impression and recommendations.  Suspected recurrent ischemic colitis. Anxiety, poor sleep, nausea and persistent abdominal cramping are main complaints today.  Resume home dose of lorazepam, which is 1 mg tid per patient. Increase dicyclomine to 20 mg ac and hs. Continue clear liquids. Planning for an outpatient colonoscopy when symptoms have improved.   Lucio Edward, MD [facility] [phone] Mon-Fri 8a-5p [phone] after 5p, weekends, holidays

## 2015-10-29 NOTE — Progress Notes (Signed)
PROGRESS NOTE    [name] [name]  [id number] DOB: 09/07/1945 DOA: 10/27/2015 PCP: London Pepper, MD    Brief Narrative: [name] [name] is a 70 y.o. female with diabetes mellitus type 2, hypothyroidism, bipolar disorder and restless leg syndrome presents to the ER with multiple episodes of diarrhea and abdominal cramps since last evening. Denies any recent sick contacts or travel. Denies using any antibiotics recently. Patient also noticed some blood-tinged diarrhea. Denies any vomiting. Pain started initially in the right side and moved to the left. On exam patient has diffuse tenderness. CT abdomen and pelvis shows possible inflammatory changes around the ileum and sigmoid. Lactic acid was elevated which improved with fluid bolus. Patient is being admitted for further hydration and management of diarrhea with possible colitis.    Assessment & Plan:   Principal Problem:   Colitis Active Problems:   Diabetes mellitus with diabetic neuropathy, without long-term current use of insulin ([place])   Hypertension   Bipolar 1 disorder ([place])     1. Colitis -ischemic vs infectious . Continue with IV antibiotics, IV fluids, GI consulted and recommendation appreciated. .  2. Diabetes mellitus type 2 - will keep patient on sliding scale coverage. Hold Glucotrol while inpatient. 3. Hypothyroidism on Synthroid. 4. Bipolar disorder on Depakote and lithium - Depakote normal, lithium low.  5. Restless leg syndrome on Requip -      DVT prophylaxis: scd Code Status: Full code.  Family Communication: husband at bedside.  Disposition Plan: remain inpatient   Consultants:   GI  Procedures:  none  Antimicrobials:   Ceftriaxone.   Flagyl    Subjective: Still with cramping abdominal pain, multiple BM.  Pain get worse when she eats.   Objective: Vitals:   10/28/15 1717 10/28/15 2043 10/29/15 0500 10/29/15 1339  BP: 135/74 (!) 152/75 (!) 146/68 (!) 164/76  Pulse: (!) 101 88 86 80    Resp:  18 18 18   Temp:  98.2 F (36.8 C) 98.4 F (36.9 C) 97.4 F (36.3 C)  TempSrc:  Oral Oral Oral  SpO2:  94% 95% 97%  Weight:   78.3 kg (172 lb 9.9 oz)   Height:        Intake/Output Summary (Last 24 hours) at 10/29/15 1341 Last data filed at 10/29/15 1010  Gross per 24 hour  Intake          3631.25 ml  Output             2550 ml  Net          1081.25 ml   Filed Weights   10/27/15 2302 10/28/15 0600 10/29/15 0500  Weight: 77.1 kg (170 lb) 78.8 kg (173 lb 11.2 oz) 78.3 kg (172 lb 9.9 oz)    Examination:  General exam: Appears calm and comfortable  Respiratory system: Clear to auscultation. Respiratory effort normal. Cardiovascular system: S1 & S2 heard, RRR. No JVD, murmurs, rubs, gallops or clicks. No pedal edema. Gastrointestinal system: Abdomen is nondistended, soft and mild tender. No organomegaly or masses felt. Normal bowel sounds heard. Central nervous system: Alert and oriented. No focal neurological deficits. Extremities: Symmetric 5 x 5 power. Skin: No rashes, lesions or ulcers Psychiatry: Judgement and insight appear normal. Mood & affect appropriate.     Data Reviewed: I have personally reviewed following labs and imaging studies  CBC:  Recent Labs Lab 10/27/15 2350 10/28/15 0617 10/29/15 0540  WBC 8.1 7.0 8.4  NEUTROABS  --  4.9  --   HGB 14.5 13.0  12.9  HCT 43.5 40.1 39.7  MCV 88.4 91.1 90.8  PLT 171 156 0000000   Basic Metabolic Panel:  Recent Labs Lab 10/27/15 2350 10/28/15 0617 10/29/15 0540  NA 135 142 138  K 4.7 4.3 4.2  CL 104 113* 109  CO2 23 23 21*  GLUCOSE 388* 293* 250*  BUN 13 9 5*  CREATININE 1.07* 0.77 0.78  CALCIUM 10.1 9.0 9.0   GFR: Estimated Creatinine Clearance: 66.1 mL/min (by C-G formula based on SCr of 0.8 mg/dL). Liver Function Tests:  Recent Labs Lab 10/27/15 2350 10/28/15 0617  AST 20 18  ALT 21 17  ALKPHOS 93 76  BILITOT 0.7 0.2*  PROT 6.8 5.8*  ALBUMIN 3.8 3.2*    Recent Labs Lab  10/27/15 2350  LIPASE 27   No results for input(s): AMMONIA in the last 168 hours. Coagulation Profile: No results for input(s): INR, PROTIME in the last 168 hours. Cardiac Enzymes: No results for input(s): CKTOTAL, CKMB, CKMBINDEX, TROPONINI in the last 168 hours. BNP (last 3 results) No results for input(s): PROBNP in the last 8760 hours. HbA1C: No results for input(s): HGBA1C in the last 72 hours. CBG:  Recent Labs Lab 10/28/15 1154 10/28/15 1648 10/28/15 2159 10/29/15 0805 10/29/15 1128  GLUCAP 260* 261* 207* 267* 247*   Lipid Profile: No results for input(s): CHOL, HDL, LDLCALC, TRIG, CHOLHDL, LDLDIRECT in the last 72 hours. Thyroid Function Tests: No results for input(s): TSH, T4TOTAL, FREET4, T3FREE, THYROIDAB in the last 72 hours. Anemia Panel: No results for input(s): VITAMINB12, FOLATE, FERRITIN, TIBC, IRON, RETICCTPCT in the last 72 hours. Sepsis Labs:  Recent Labs Lab 10/28/15 0130 10/28/15 0311 10/28/15 0617 10/28/15 0849  LATICACIDVEN 3.19* 2.06* 2.3* 1.9    Recent Results (from the past 240 hour(s))  Gastrointestinal Panel by PCR , Stool     Status: None   Collection Time: 10/28/15  7:22 AM  Result Value Ref Range Status   Campylobacter species NOT DETECTED NOT DETECTED Final   Plesimonas shigelloides NOT DETECTED NOT DETECTED Final   Salmonella species NOT DETECTED NOT DETECTED Final   Yersinia enterocolitica NOT DETECTED NOT DETECTED Final   Vibrio species NOT DETECTED NOT DETECTED Final   Vibrio cholerae NOT DETECTED NOT DETECTED Final   Enteroaggregative E coli (EAEC) NOT DETECTED NOT DETECTED Final   Enteropathogenic E coli (EPEC) NOT DETECTED NOT DETECTED Final   Enterotoxigenic E coli (ETEC) NOT DETECTED NOT DETECTED Final   Shiga like toxin producing E coli (STEC) NOT DETECTED NOT DETECTED Final   E. coli O157 NOT DETECTED NOT DETECTED Final   Shigella/Enteroinvasive E coli (EIEC) NOT DETECTED NOT DETECTED Final   Cryptosporidium NOT  DETECTED NOT DETECTED Final   Cyclospora cayetanensis NOT DETECTED NOT DETECTED Final   Entamoeba histolytica NOT DETECTED NOT DETECTED Final   Giardia lamblia NOT DETECTED NOT DETECTED Final   Adenovirus F40/41 NOT DETECTED NOT DETECTED Final   Astrovirus NOT DETECTED NOT DETECTED Final   Norovirus GI/GII NOT DETECTED NOT DETECTED Final   Rotavirus A NOT DETECTED NOT DETECTED Final   Sapovirus (I, II, IV, and V) NOT DETECTED NOT DETECTED Final  C difficile quick scan w PCR reflex     Status: None   Collection Time: 10/28/15  7:22 AM  Result Value Ref Range Status   C Diff antigen NEGATIVE NEGATIVE Final   C Diff toxin NEGATIVE NEGATIVE Final   C Diff interpretation No C. difficile detected.  Final  Radiology Studies: Ct Angio Abd/pel W And/or Wo Contrast  Result Date: 10/28/2015 CLINICAL DATA:  Increasing lower abdominal cramping and pain. Elevated lactate. EXAM: CTA ABDOMEN AND PELVIS WITHOUT AND WITH CONTRAST TECHNIQUE: Multidetector CT imaging of the abdomen and pelvis was performed using the standard protocol during bolus administration of intravenous contrast. Multiplanar reconstructed images and MIPs were obtained and reviewed to evaluate the vascular anatomy. CONTRAST:  100 cc Isovue 370 IV COMPARISON:  CT 12/31/2014, additional prior exams reviewed FINDINGS: VASCULAR Aorta: Normal in caliber. No dissection or aneurysm. No significant atherosclerosis. No acute aortic syndrome. Celiac: Patent, normal in caliber.  No abrupt occlusion. SMA: Patent, normal in caliber.  No abrupt occlusion. Renals: Single bilateral, patent. IMA: Patent. Inflow: Patent, normal in caliber.  Mildly tortuous. Proximal Outflow: Patent, normal in caliber. Veins: Mesenteric veins are patent. No thrombus. Portal venous system is patent. Review of the MIP images confirms the above findings. NON-VASCULAR Lower chest: Left lower lobe 5 mm pulmonary nodule stable dating back to 2014 and considered benign. Mild  patchy and ground-glass opacities in the left lower lobe. Minimal focal fissural thickening involving the minor fissure. Mild peripheral atelectasis in the right lower lobe. Hepatobiliary: Decreased density consistent with steatosis. No focal lesion. Gallbladder minimally distended, no calcified stone. Pancreas: No ductal dilatation or inflammation. Spleen: Normal in size.  No focal abnormality. Adrenals/Urinary Tract: Normal adrenal glands. Chronic right hydronephrosis and dilatation of the renal pelvis with transition at the ureteropelvic junction exam unchanged from prior. Associated thinning of the right renal cortex. No perinephric edema. No left hydronephrosis. Stomach/Bowel: Stomach physiologically distended. Equivocal small bowel wall thickening involving distal ileum. Terminal ileum appears normal. Majority of the colon is decompressed. Equivocal wall thickening involving the sigmoid colon. Scattered sigmoid colonic diverticular without acute diverticulitis. Lymphatic: Small perihepatic nodes are unchanged. No retroperitoneal, mesenteric, or pelvic adenopathy. Reproductive: Uterus is surgically absent. There is no adnexal mass. Musculoskeletal: There are no acute or suspicious osseous abnormalities. Scattered degenerative change in the spine. Right hip prosthesis. Other: No free air or free fluid.  No mesenteric edema. IMPRESSION: Vascular No acute abnormality.  Patent mesenteric vasculature. Non Vascular 1. Equivocal wall thickening involving the sigmoid colon, may reflect mild colitis. Similar equivocal wall thickening involving distal ileal bowel loops in the right lower quadrant. This may be infectious or inflammatory. Distribution does not suggest a vascular etiology. 2. Patchy and ground-glass opacities in the left lower lobe in the included lung bases, may be infectious or inflammatory. 3. Stable chronic findings include hepatic steatosis, congenital right UPJ stenosis, and sigmoid colonic  diverticulosis without acute diverticulitis. Electronically Signed   By: Jeb Levering M.D.   On: 10/28/2015 02:30        Scheduled Meds: . cefTRIAXone (ROCEPHIN)  IV  2 g Intravenous Q24H  . dicyclomine  10 mg Oral TID  . divalproex  750 mg Oral QHS  . gabapentin  100 mg Oral 2 times per day  . insulin aspart  0-9 Units Subcutaneous TID WC  . levothyroxine  75 mcg Oral QAC breakfast  . lithium carbonate  600 mg Oral QHS  . metronidazole  500 mg Intravenous Q8H  . multivitamin with minerals  1 tablet Oral q morning - 10a  . naltrexone  50 mg Oral QPM  . QUEtiapine  100 mg Oral Q24H  . QUEtiapine  200 mg Oral Q24H  . rOPINIRole  1.5 mg Oral Q24H  . rOPINIRole  2 mg Oral Q24H  . rosuvastatin  10  mg Oral Daily   Continuous Infusions: . sodium chloride 125 mL/hr at 10/29/15 0907     LOS: 1 day    Time spent: 25 minutes.     Elmarie Shiley, MD [facility] Pager [phone]  If 7PM-7AM, please contact night-coverage [email or web address] Password [email or web address] 10/29/2015, 1:41 PM

## 2015-10-30 DIAGNOSIS — I10 Essential (primary) hypertension: Secondary | ICD-10-CM

## 2015-10-30 DIAGNOSIS — R110 Nausea: Secondary | ICD-10-CM | POA: Diagnosis present

## 2015-10-30 LAB — GLUCOSE, CAPILLARY
Glucose-Capillary: 160 mg/dL — ABNORMAL HIGH (ref 65–99)
Glucose-Capillary: 195 mg/dL — ABNORMAL HIGH (ref 65–99)
Glucose-Capillary: 196 mg/dL — ABNORMAL HIGH (ref 65–99)
Glucose-Capillary: 175 mg/dL — ABNORMAL HIGH (ref 65–99)

## 2015-10-30 MED ORDER — METRONIDAZOLE 500 MG PO TABS
500.0000 mg | ORAL_TABLET | Freq: Three times a day (TID) | ORAL | Status: DC
  Administered 2015-10-30 – 2015-10-31 (×3): 500 mg via ORAL
  Filled 2015-10-30 (×3): qty 1

## 2015-10-30 MED ORDER — GLIPIZIDE ER 5 MG PO TB24
5.0000 mg | ORAL_TABLET | Freq: Every day | ORAL | Status: DC
  Administered 2015-10-30 – 2015-10-31 (×2): 5 mg via ORAL
  Filled 2015-10-30 (×2): qty 1

## 2015-10-30 MED ORDER — INSULIN GLARGINE 100 UNIT/ML ~~LOC~~ SOLN
12.0000 [IU] | Freq: Every day | SUBCUTANEOUS | Status: DC
  Administered 2015-10-30: 12 [IU] via SUBCUTANEOUS
  Filled 2015-10-30: qty 0.12

## 2015-10-30 MED ORDER — METOCLOPRAMIDE HCL 5 MG/ML IJ SOLN
5.0000 mg | Freq: Three times a day (TID) | INTRAMUSCULAR | Status: DC | PRN
Start: 2015-10-30 — End: 2015-10-31

## 2015-10-30 NOTE — Progress Notes (Signed)
[facility] Progress Note  Subjective:  Feeling better today compared to yesterday.  Thinks that Bentyl is helping.  Last dose of IV pain medication was 9 hours ago.  Thinks that the morphine is giving her headaches.  Is anticipating advancing diet to full liquids with possible discharge home later today.  Objective:  Vital signs in last 24 hours: Temp:  [97.4 F (36.3 C)-98.3 F (36.8 C)] 98.3 F (36.8 C) (08/23 0541) Pulse Rate:  [75-81] 75 (08/23 0541) Resp:  [18] 18 (08/23 0541) BP: (129-164)/(68-95) 129/68 (08/23 0541) SpO2:  [95 %-97 %] 97 % (08/23 0541) Weight:  [172 lb 9.9 oz (78.3 kg)] 172 lb 9.9 oz (78.3 kg) (08/23 0644) Last BM Date: 10/30/15 General:  Alert, Well-developed, in NAD Heart:  Regular rate and rhythm; no murmurs Pulm:  CTAB.  No W/R/R. Abdomen:  Soft, non-distended.  BS present.  Mild diffuse TTP. Extremities:  Without edema. Neurologic:  Alert and oriented x 4;  grossly normal neurologically. Psych:  Alert and cooperative. Normal mood and affect.  Intake/Output from previous day: 08/22 0701 - 08/23 0700 In: 3090 [P.O.:240; I.V.:2500; IV Piggyback:350] Out: 350 [Urine:350] Intake/Output this shift: Total I/O In: -  Out: 600 [Urine:600]  Lab Results:  Recent Labs  10/27/15 2350 10/28/15 0617 10/29/15 0540  WBC 8.1 7.0 8.4  HGB 14.5 13.0 12.9  HCT 43.5 40.1 39.7  PLT 171 156 178   BMET  Recent Labs  10/27/15 2350 10/28/15 0617 10/29/15 0540  NA 135 142 138  K 4.7 4.3 4.2  CL 104 113* 109  CO2 23 23 21*  GLUCOSE 388* 293* 250*  BUN 13 9 5*  CREATININE 1.07* 0.77 0.78  CALCIUM 10.1 9.0 9.0   LFT  Recent Labs  10/28/15 0617  PROT 5.8*  ALBUMIN 3.2*  AST 18  ALT 17  ALKPHOS 76  BILITOT 0.2*   Assessment / Plan: -70 year old female with multiple episodes of what has been called recurrent ischemic colitis.  Presented again with complaints of abdominal pain and bloody diarrhea.  CT angio shows patent  vasculature and distribution not necessarily c/w ischemic episode. No procedures/biopsies showing signs of IBD in the past. Stool studies are negative. May have small vessel and chronic dehydration (does not drink much in the way of fluids at home).  *Continue Bentyl 20 mg ACHS and she can go home with this. *Diet increased to full liquids and if this is tolerated throughout the day then she can go home later this afternoon/evening.  Would remain on full liquids until much improved and then advance to soft/low fiber diet. *Will plan for repeat colonoscopy as an outpatient. *Needs to increase PO liquid intake at home to avoid dehydration in the future. *Does not need to go home with antibiotics. *Follow-up with Dr. Loletha Carrow in September placed in D/C instructions.   LOS: 2 days   ZEHR, JESSICA D.  10/30/2015, 8:42 AM  Pager number [phone]     Attending physician's note   I have taken an interval history, reviewed the chart and examined the patient. I agree with the Advanced Practitioner's note, impression and recommendations. She steadily improving. Advance to full liquids and if tolerated she can go home on full liquids and gradually advance to a soft diet at home. Continue dicyclomine 20 mg po ac and hs for now and as outpatient. OP GI follow up with Dr. Loletha Carrow. GI signing off. Please call if needed.   Lucio Edward, MD [facility]  BY:[phone] Mon-Fri 8a-5p [phone] after 5p, weekends, holidays

## 2015-10-30 NOTE — Progress Notes (Signed)
PROGRESS NOTE                                                                                                                                                                                                             Patient Demographics:    [name] [name], is a 70 y.o. female, DOB - February 01, 1946, [id number]  Admit date - 10/27/2015   Admitting Physician Rise Patience, MD  Outpatient Primary MD for the patient is London Pepper, MD  LOS - 2  Outpatient Specialists: NONE  Chief Complaint  Patient presents with  . Abdominal Pain  . Rectal Bleeding       Brief Narrative   70 year old female with type 2 diabetes mellitus, hypothyroidism, bipolar disorder and this is a syndrome presented to the ED with multiple episodes of diarrhea and abdominal cramping since one day. CT abdomen and pelvis showing inflammatory changes around the ileum and sigmoid with concern for ischemic colitis.   Subjective:    better since admission but still having left lower quadrant abdominal cramping and nausea.   Assessment  & Plan :    Principal Problem:   Acute sigmoid Colitis Infectious versus ischemic. Continue IV hydration, pain control, Bentyl for bowel spasms. Continue full liquid diet. Continue empiric Rocephin and oral Flagyl. Encourage by mouth intake and ambulation. Added Reglan for nausea. GI recommends home once clinically improved with outpatient follow-up with Dr. Loletha Carrow next month.   Active Problems:   Diabetes mellitus with diabetic neuropathy, without long-term current use of insulin ([name]) Stable. Continue  sliding scale coverage.    Hypertension Stable.    Bipolar 1 disorder ([name]) Continue lithium and Depakote. Stable.       Code Status : Full code  Family Communication  : Husband at bedside  Disposition Plan  : Home in a.m.  Barriers For Discharge : Active symptoms  Consults  :  [facility]  Procedures  : CT abdomen and pelvis  DVT Prophylaxis  :   SCDs   Lab Results  Component Value Date   PLT 178 10/29/2015    Antibiotics  :   Anti-infectives    Start     Dose/Rate Route Frequency Ordered Stop   10/30/15 1400  metroNIDAZOLE (FLAGYL) tablet 500 mg     500 mg Oral Every 8 hours 10/30/15 0959  10/28/15 2200  cefTRIAXone (ROCEPHIN) 2 g in dextrose 5 % 50 mL IVPB     2 g 100 mL/hr over 30 Minutes Intravenous Every 24 hours 10/28/15 0619     10/28/15 1400  metroNIDAZOLE (FLAGYL) IVPB 500 mg  Status:  Discontinued     500 mg 100 mL/hr over 60 Minutes Intravenous Every 8 hours 10/28/15 0556 10/30/15 0959   10/28/15 0330  cefTRIAXone (ROCEPHIN) 2 g in dextrose 5 % 50 mL IVPB     2 g 100 mL/hr over 30 Minutes Intravenous  Once 10/28/15 0322 10/28/15 0407   10/28/15 0330  metroNIDAZOLE (FLAGYL) IVPB 500 mg     500 mg 100 mL/hr over 60 Minutes Intravenous  Once 10/28/15 0322 10/28/15 0437        Objective:   Vitals:   10/29/15 2026 10/30/15 0541 10/30/15 0644 10/30/15 1345  BP: (!) 158/95 129/68  (!) 151/86  Pulse: 81 75  98  Resp: 18 18  18   Temp: 98.3 F (36.8 C) 98.3 F (36.8 C)  98.9 F (37.2 C)  TempSrc: Oral Oral  Oral  SpO2: 95% 97%  95%  Weight:   78.3 kg (172 lb 9.9 oz)   Height:        Wt Readings from Last 3 Encounters:  10/30/15 78.3 kg (172 lb 9.9 oz)  08/15/15 76.9 kg (169 lb 8 oz)  07/05/15 76.9 kg (169 lb 8.5 oz)     Intake/Output Summary (Last 24 hours) at 10/30/15 1518 Last data filed at 10/30/15 0748  Gross per 24 hour  Intake             1375 ml  Output              600 ml  Net              775 ml     Physical Exam  Gen: not in distress HEENT: no pallor, Moist mucosa, supple neck Chest: clear b/l, no added sounds CVS: N S1&S2, no murmurs, rubs or gallop GI: soft, NONdistended, bowel sounds present , left lower quadrant tenderness+ Musculoskeletal: warm, no edema     Data Review:    CBC  Recent Labs Lab  10/27/15 2350 10/28/15 0617 10/29/15 0540  WBC 8.1 7.0 8.4  HGB 14.5 13.0 12.9  HCT 43.5 40.1 39.7  PLT 171 156 178  MCV 88.4 91.1 90.8  MCH 29.5 29.5 29.5  MCHC 33.3 32.4 32.5  RDW 13.2 13.5 13.5  LYMPHSABS  --  1.6  --   MONOABS  --  0.5  --   EOSABS  --  0.0  --   BASOSABS  --  0.0  --     Chemistries   Recent Labs Lab 10/27/15 2350 10/28/15 0617 10/29/15 0540  NA 135 142 138  K 4.7 4.3 4.2  CL 104 113* 109  CO2 23 23 21*  GLUCOSE 388* 293* 250*  BUN 13 9 5*  CREATININE 1.07* 0.77 0.78  CALCIUM 10.1 9.0 9.0  AST 20 18  --   ALT 21 17  --   ALKPHOS 93 76  --   BILITOT 0.7 0.2*  --    ------------------------------------------------------------------------------------------------------------------ No results for input(s): CHOL, HDL, LDLCALC, TRIG, CHOLHDL, LDLDIRECT in the last 72 hours.  Lab Results  Component Value Date   HGBA1C 6.2 (H) 06/09/2013   ------------------------------------------------------------------------------------------------------------------ No results for input(s): TSH, T4TOTAL, T3FREE, THYROIDAB in the last 72 hours.  Invalid input(s): FREET3 ------------------------------------------------------------------------------------------------------------------ No results  for input(s): VITAMINB12, FOLATE, FERRITIN, TIBC, IRON, RETICCTPCT in the last 72 hours.  Coagulation profile No results for input(s): INR, PROTIME in the last 168 hours.  No results for input(s): DDIMER in the last 72 hours.  Cardiac Enzymes No results for input(s): CKMB, TROPONINI, MYOGLOBIN in the last 168 hours.  Invalid input(s): CK ------------------------------------------------------------------------------------------------------------------ No results found for: BNP  Inpatient Medications  Scheduled Meds: . cefTRIAXone (ROCEPHIN)  IV  2 g Intravenous Q24H  . dicyclomine  20 mg Oral TID AC & HS  . divalproex  750 mg Oral QHS  . gabapentin  100 mg Oral  2 times per day  . insulin aspart  0-9 Units Subcutaneous TID WC  . levothyroxine  75 mcg Oral QAC breakfast  . lithium carbonate  600 mg Oral QHS  . LORazepam  1 mg Oral TID BM  . metroNIDAZOLE  500 mg Oral Q8H  . multivitamin with minerals  1 tablet Oral q morning - 10a  . naltrexone  50 mg Oral QPM  . QUEtiapine  100 mg Oral Q24H  . QUEtiapine  200 mg Oral Q24H  . rOPINIRole  1.5 mg Oral Q24H  . rOPINIRole  2 mg Oral Q24H  . rosuvastatin  10 mg Oral Daily   Continuous Infusions:  PRN Meds:.acetaminophen **OR** acetaminophen, metoCLOPramide (REGLAN) injection, morphine injection, ondansetron **OR** ondansetron (ZOFRAN) IV  Micro Results Recent Results (from the past 240 hour(s))  Gastrointestinal Panel by PCR , Stool     Status: None   Collection Time: 10/28/15  7:22 AM  Result Value Ref Range Status   Campylobacter species NOT DETECTED NOT DETECTED Final   Plesimonas shigelloides NOT DETECTED NOT DETECTED Final   Salmonella species NOT DETECTED NOT DETECTED Final   Yersinia enterocolitica NOT DETECTED NOT DETECTED Final   Vibrio species NOT DETECTED NOT DETECTED Final   Vibrio cholerae NOT DETECTED NOT DETECTED Final   Enteroaggregative E coli (EAEC) NOT DETECTED NOT DETECTED Final   Enteropathogenic E coli (EPEC) NOT DETECTED NOT DETECTED Final   Enterotoxigenic E coli (ETEC) NOT DETECTED NOT DETECTED Final   Shiga like toxin producing E coli (STEC) NOT DETECTED NOT DETECTED Final   E. coli O157 NOT DETECTED NOT DETECTED Final   Shigella/Enteroinvasive E coli (EIEC) NOT DETECTED NOT DETECTED Final   Cryptosporidium NOT DETECTED NOT DETECTED Final   Cyclospora cayetanensis NOT DETECTED NOT DETECTED Final   Entamoeba histolytica NOT DETECTED NOT DETECTED Final   Giardia lamblia NOT DETECTED NOT DETECTED Final   Adenovirus F40/41 NOT DETECTED NOT DETECTED Final   Astrovirus NOT DETECTED NOT DETECTED Final   Norovirus GI/GII NOT DETECTED NOT DETECTED Final   Rotavirus A NOT  DETECTED NOT DETECTED Final   Sapovirus (I, II, IV, and V) NOT DETECTED NOT DETECTED Final  C difficile quick scan w PCR reflex     Status: None   Collection Time: 10/28/15  7:22 AM  Result Value Ref Range Status   C Diff antigen NEGATIVE NEGATIVE Final   C Diff toxin NEGATIVE NEGATIVE Final   C Diff interpretation No C. difficile detected.  Final    Radiology Reports Ct Angio Abd/pel W And/or Wo Contrast  Result Date: 10/28/2015 CLINICAL DATA:  Increasing lower abdominal cramping and pain. Elevated lactate. EXAM: CTA ABDOMEN AND PELVIS WITHOUT AND WITH CONTRAST TECHNIQUE: Multidetector CT imaging of the abdomen and pelvis was performed using the standard protocol during bolus administration of intravenous contrast. Multiplanar reconstructed images and MIPs were obtained and reviewed to  evaluate the vascular anatomy. CONTRAST:  100 cc Isovue 370 IV COMPARISON:  CT 12/31/2014, additional prior exams reviewed FINDINGS: VASCULAR Aorta: Normal in caliber. No dissection or aneurysm. No significant atherosclerosis. No acute aortic syndrome. Celiac: Patent, normal in caliber.  No abrupt occlusion. SMA: Patent, normal in caliber.  No abrupt occlusion. Renals: Single bilateral, patent. IMA: Patent. Inflow: Patent, normal in caliber.  Mildly tortuous. Proximal Outflow: Patent, normal in caliber. Veins: Mesenteric veins are patent. No thrombus. Portal venous system is patent. Review of the MIP images confirms the above findings. NON-VASCULAR Lower chest: Left lower lobe 5 mm pulmonary nodule stable dating back to 2014 and considered benign. Mild patchy and ground-glass opacities in the left lower lobe. Minimal focal fissural thickening involving the minor fissure. Mild peripheral atelectasis in the right lower lobe. Hepatobiliary: Decreased density consistent with steatosis. No focal lesion. Gallbladder minimally distended, no calcified stone. Pancreas: No ductal dilatation or inflammation. Spleen: Normal in size.   No focal abnormality. Adrenals/Urinary Tract: Normal adrenal glands. Chronic right hydronephrosis and dilatation of the renal pelvis with transition at the ureteropelvic junction exam unchanged from prior. Associated thinning of the right renal cortex. No perinephric edema. No left hydronephrosis. Stomach/Bowel: Stomach physiologically distended. Equivocal small bowel wall thickening involving distal ileum. Terminal ileum appears normal. Majority of the colon is decompressed. Equivocal wall thickening involving the sigmoid colon. Scattered sigmoid colonic diverticular without acute diverticulitis. Lymphatic: Small perihepatic nodes are unchanged. No retroperitoneal, mesenteric, or pelvic adenopathy. Reproductive: Uterus is surgically absent. There is no adnexal mass. Musculoskeletal: There are no acute or suspicious osseous abnormalities. Scattered degenerative change in the spine. Right hip prosthesis. Other: No free air or free fluid.  No mesenteric edema. IMPRESSION: Vascular No acute abnormality.  Patent mesenteric vasculature. Non Vascular 1. Equivocal wall thickening involving the sigmoid colon, may reflect mild colitis. Similar equivocal wall thickening involving distal ileal bowel loops in the right lower quadrant. This may be infectious or inflammatory. Distribution does not suggest a vascular etiology. 2. Patchy and ground-glass opacities in the left lower lobe in the included lung bases, may be infectious or inflammatory. 3. Stable chronic findings include hepatic steatosis, congenital right UPJ stenosis, and sigmoid colonic diverticulosis without acute diverticulitis. Electronically Signed   By: Jeb Levering M.D.   On: 10/28/2015 02:30    Time Spent in minutes  25   Louellen Molder M.D on 10/30/2015 at 3:18 PM  Between 7am to 7pm - Pager - [phone]  After 7pm go to [email or web address] - password [facility]  [facility] -  Office  [phone]

## 2015-10-30 NOTE — Progress Notes (Signed)
Patient c/o cramping and stomach upset after eating dinner.  Patient had a small bowel movement and reported some relief.  Will continue to monitor closely.

## 2015-10-31 DIAGNOSIS — E0840 Diabetes mellitus due to underlying condition with diabetic neuropathy, unspecified: Secondary | ICD-10-CM

## 2015-10-31 DIAGNOSIS — F319 Bipolar disorder, unspecified: Secondary | ICD-10-CM

## 2015-10-31 LAB — GLUCOSE, CAPILLARY: Glucose-Capillary: 138 mg/dL — ABNORMAL HIGH (ref 65–99)

## 2015-10-31 MED ORDER — CIPROFLOXACIN HCL 500 MG PO TABS: 500.0000 mg | ORAL_TABLET | Freq: Two times a day (BID) | ORAL | 0 refills | Status: AC

## 2015-10-31 MED ORDER — METOCLOPRAMIDE HCL 5 MG/ML IJ SOLN
10.0000 mg | Freq: Once | INTRAMUSCULAR | Status: AC
  Administered 2015-10-31: 10 mg via INTRAVENOUS
  Filled 2015-10-31: qty 2

## 2015-10-31 MED ORDER — KETOROLAC TROMETHAMINE 30 MG/ML IJ SOLN
30.0000 mg | Freq: Once | INTRAMUSCULAR | Status: AC
  Administered 2015-10-31: 30 mg via INTRAVENOUS
  Filled 2015-10-31: qty 1

## 2015-10-31 MED ORDER — KETOROLAC TROMETHAMINE 15 MG/ML IJ SOLN
15.0000 mg | Freq: Once | INTRAMUSCULAR | Status: AC
  Administered 2015-10-31: 15 mg via INTRAVENOUS
  Filled 2015-10-31: qty 1

## 2015-10-31 MED ORDER — IBUPROFEN 600 MG PO TABS: 600.0000 mg | ORAL_TABLET | Freq: Three times a day (TID) | ORAL | 0 refills | Status: DC | PRN

## 2015-10-31 MED ORDER — DICYCLOMINE HCL 10 MG PO CAPS: 20.0000 mg | ORAL_CAPSULE | Freq: Three times a day (TID) | ORAL | 0 refills | Status: DC

## 2015-10-31 MED ORDER — ROPINIROLE HCL 1 MG PO TABS
1.5000 mg | ORAL_TABLET | Freq: Once | ORAL | Status: AC
  Administered 2015-10-31: 1.5 mg via ORAL
  Filled 2015-10-31: qty 1

## 2015-10-31 MED ORDER — METRONIDAZOLE 500 MG PO TABS: 500.0000 mg | ORAL_TABLET | Freq: Three times a day (TID) | ORAL | 0 refills | Status: AC

## 2015-10-31 MED ORDER — DIPHENHYDRAMINE HCL 50 MG/ML IJ SOLN
25.0000 mg | Freq: Once | INTRAMUSCULAR | Status: AC
  Administered 2015-10-31: 25 mg via INTRAVENOUS
  Filled 2015-10-31: qty 1

## 2015-10-31 NOTE — Care Management Note (Signed)
Case Management Note  Patient Details  Name: [name] [name] MRN: [id number] Date of Birth: 1945-08-26  Subjective/Objective:                    Action/Plan:d/c home no needs or orders.   Expected Discharge Date:                  Expected Discharge Plan:  Home/Self Care  In-House Referral:     Discharge planning Services  CM Consult  Post Acute Care Choice:    Choice offered to:     DME Arranged:    DME Agency:     HH Arranged:    [place] Agency:     Status of Service:  Completed, signed off  If discussed at [employer] of Stay Meetings, dates discussed:    Additional Comments:  Dessa Phi, RN 10/31/2015, 11:21 AM

## 2015-10-31 NOTE — Discharge Summary (Signed)
Physician Discharge Summary  [name] [name] [id number] DOB: 06/07/1945 DOA: 10/27/2015  PCP: London Pepper, MD  Admit date: 10/27/2015 Discharge date: 10/31/2015  Admitted From: HOME Disposition:  HOME  Recommendations for Outpatient Follow-up:  1. Follow up with PCP in 1-2 weeks. Patient will be discharged on 5 more days of oral ciprofloxacin and Flagyl (stop date 11/05/2015) 2. Follow-up with [facility] as outpatient.   Home Health: None Equipment/Devices: None  Discharge Condition: Stable CODE STATUS: Full code Diet recommendation: Full liquid, slowly advance to soft diet    Discharge Diagnoses:  Principal Problem:   Acute descending Colitis  Active Problems:   Diabetes mellitus with diabetic neuropathy, without long-term current use of insulin ([name])   Hypertension   Bipolar 1 disorder ([name])   Nausea without vomiting  Brief narrative/history of present illness 70 year old female with type 2 diabetes mellitus, hypothyroidism, bipolar disorder and this is a syndrome presented to the ED with multiple episodes of diarrhea and abdominal cramping since one day. CT abdomen and pelvis showing inflammatory changes around the ileum and sigmoid due to infectious versus ischemic colitis.  Hospital course Principal Problem:   Acute sigmoid Colitis Infectious versus ischemic. Improved with IV hydration, pain control, Bentyl for bowel spasms. Tolerating advanced diet. Received empiric Rocephin and oral Flagyl. Will discharge on oral Cipro and Flagyl for 5 more days. GI recommends home once clinically improved with outpatient follow-up with Dr. Loletha Carrow next month.   Active Problems:   Diabetes mellitus with diabetic neuropathy, without long-term current use of insulin ([name]) Stable. Resume glipizide.    Hypertension Stable.    Bipolar 1 disorder ([name]) Continue lithium and Depakote. Stable.   headaches Suspect triggered by narcotics. Can take when necessary NSAIDs as  outpatient.     Family Communication  : Husband at bedside  Disposition Plan  : Home   Consults  :  [facility]  Procedures  : CT abdomen and pelvis    Discharge Instructions     Medication List    STOP taking these medications   hyoscyamine 0.375 MG 12 hr tablet Commonly known as:  LEVBID     TAKE these medications   acetaminophen 500 MG tablet Commonly known as:  TYLENOL Take 1,000 mg by mouth every 6 (six) hours as needed for mild pain, moderate pain, fever or headache.   cholecalciferol 1000 units tablet Commonly known as:  VITAMIN D Take 1,000 Units by mouth daily.   ciprofloxacin 500 MG tablet Commonly known as:  CIPRO Take 1 tablet (500 mg total) by mouth 2 (two) times daily.   dicyclomine 10 MG capsule Commonly known as:  BENTYL Take 2 capsules (20 mg total) by mouth 4 (four) times daily -  before meals and at bedtime.   divalproex 250 MG 24 hr tablet Commonly known as:  DEPAKOTE ER Take 750 mg by mouth at bedtime.   gabapentin 100 MG capsule Commonly known as:  NEURONTIN Take 1 capsule (100 mg total) by mouth 3 (three) times daily. What changed:  when to take this  additional instructions   glipiZIDE 5 MG 24 hr tablet Commonly known as:  GLUCOTROL XL Take 5 mg by mouth daily.   ibuprofen 600 MG tablet Commonly known as:  ADVIL,MOTRIN Take 1 tablet (600 mg total) by mouth every 8 (eight) hours as needed (migraine headaches).   L-methylfolate Calcium 15 MG Tabs Take 1 tablet by mouth every morning.   LEVOTHROID 75 MCG tablet Generic drug:  levothyroxine Take 75 mcg  by mouth daily before breakfast.   lithium 300 MG tablet Take 600 mg by mouth at bedtime.   LORazepam 0.5 MG tablet Commonly known as:  ATIVAN Take 0.5-1 mg by mouth every 6 (six) hours as needed for anxiety or sleep.   metroNIDAZOLE 500 MG tablet Commonly known as:  FLAGYL Take 1 tablet (500 mg total) by mouth every 8 (eight) hours.   naltrexone 50 MG  tablet Commonly known as:  DEPADE Take 50 mg by mouth every evening.   ondansetron 4 MG tablet Commonly known as:  ZOFRAN Take 1 tablet by mouth 2 (two) times daily as needed for nausea.   QUEtiapine 200 MG tablet Commonly known as:  SEROQUEL Take 100-200 mg by mouth 2 (two) times daily. Takes 100mg  at 1700 and 200mg  at 1930   rOPINIRole 0.5 MG tablet Commonly known as:  REQUIP Take 3 tablets (1.5 mg total) by mouth daily. What changed:  how much to take  when to take this  additional instructions   rosuvastatin 10 MG tablet Commonly known as:  CRESTOR take 1 tablet by mouth once daily What changed:  how much to take  how to take this  when to take this  additional instructions      Follow-up Information    [name], MD Follow up on 12/06/2015.   Specialty:  Gastroenterology Why:  4:00 pm  Contact information: [address] [place] [address] [phone]        London Pepper, MD. Schedule an appointment as soon as possible for a visit in 1 week(s).   Specialty:  Family Medicine Contact information: [address] [place] [address] [phone]          Allergies  Allergen Reactions  . Codeine Anxiety and Other (See Comments)    hallucinations  . Macrolides And Ketolides Nausea And Vomiting    All mycins  . Procaine Hcl Palpitations  . Dilaudid [Hydromorphone Hcl]   . Sulfa Antibiotics Other (See Comments)      Procedures/Studies: Ct Angio Abd/pel W And/or Wo Contrast  Result Date: 10/28/2015 CLINICAL DATA:  Increasing lower abdominal cramping and pain. Elevated lactate. EXAM: CTA ABDOMEN AND PELVIS WITHOUT AND WITH CONTRAST TECHNIQUE: Multidetector CT imaging of the abdomen and pelvis was performed using the standard protocol during bolus administration of intravenous contrast. Multiplanar reconstructed images and MIPs were obtained and reviewed to evaluate the vascular anatomy. CONTRAST:  100 cc  Isovue 370 IV COMPARISON:  CT 12/31/2014, additional prior exams reviewed FINDINGS: VASCULAR Aorta: Normal in caliber. No dissection or aneurysm. No significant atherosclerosis. No acute aortic syndrome. Celiac: Patent, normal in caliber.  No abrupt occlusion. SMA: Patent, normal in caliber.  No abrupt occlusion. Renals: Single bilateral, patent. IMA: Patent. Inflow: Patent, normal in caliber.  Mildly tortuous. Proximal Outflow: Patent, normal in caliber. Veins: Mesenteric veins are patent. No thrombus. Portal venous system is patent. Review of the MIP images confirms the above findings. NON-VASCULAR Lower chest: Left lower lobe 5 mm pulmonary nodule stable dating back to 2014 and considered benign. Mild patchy and ground-glass opacities in the left lower lobe. Minimal focal fissural thickening involving the minor fissure. Mild peripheral atelectasis in the right lower lobe. Hepatobiliary: Decreased density consistent with steatosis. No focal lesion. Gallbladder minimally distended, no calcified stone. Pancreas: No ductal dilatation or inflammation. Spleen: Normal in size.  No focal abnormality. Adrenals/Urinary Tract: Normal adrenal glands. Chronic right hydronephrosis and dilatation of the renal pelvis  with transition at the ureteropelvic junction exam unchanged from prior. Associated thinning of the right renal cortex. No perinephric edema. No left hydronephrosis. Stomach/Bowel: Stomach physiologically distended. Equivocal small bowel wall thickening involving distal ileum. Terminal ileum appears normal. Majority of the colon is decompressed. Equivocal wall thickening involving the sigmoid colon. Scattered sigmoid colonic diverticular without acute diverticulitis. Lymphatic: Small perihepatic nodes are unchanged. No retroperitoneal, mesenteric, or pelvic adenopathy. Reproductive: Uterus is surgically absent. There is no adnexal mass. Musculoskeletal: There are no acute or suspicious osseous abnormalities.  Scattered degenerative change in the spine. Right hip prosthesis. Other: No free air or free fluid.  No mesenteric edema. IMPRESSION: Vascular No acute abnormality.  Patent mesenteric vasculature. Non Vascular 1. Equivocal wall thickening involving the sigmoid colon, may reflect mild colitis. Similar equivocal wall thickening involving distal ileal bowel loops in the right lower quadrant. This may be infectious or inflammatory. Distribution does not suggest a vascular etiology. 2. Patchy and ground-glass opacities in the left lower lobe in the included lung bases, may be infectious or inflammatory. 3. Stable chronic findings include hepatic steatosis, congenital right UPJ stenosis, and sigmoid colonic diverticulosis without acute diverticulitis. Electronically Signed   By: Jeb Levering M.D.   On: 10/28/2015 02:30       Subjective: Abdominal symptoms resolved but complains of migraine headaches.  Discharge Exam: Vitals:   10/30/15 2117 10/31/15 0456  BP: (!) 144/87 140/70  Pulse: 80 85  Resp: 18 18  Temp: 98.7 F (37.1 C) 98.5 F (36.9 C)   Vitals:   10/30/15 0644 10/30/15 1345 10/30/15 2117 10/31/15 0456  BP:  (!) 151/86 (!) 144/87 140/70  Pulse:  98 80 85  Resp:  18 18 18   Temp:  98.9 F (37.2 C) 98.7 F (37.1 C) 98.5 F (36.9 C)  TempSrc:  Oral Oral Oral  SpO2:  95% 93% 96%  Weight: 78.3 kg (172 lb 9.9 oz)   75.5 kg (166 lb 7.2 oz)  Height:        General: Not in distress HEENT: No pallor, moist mucosa, supple neck Cardiovascular: RRR, S1/S2 +, no rubs, no gallops Respiratory: CTA bilaterally, no wheezing, no rhonchi Abdominal: Soft, NT, ND, bowel sounds + Extremities: Warm, no edema    The results of significant diagnostics from this hospitalization (including imaging, microbiology, ancillary and laboratory) are listed below for reference.     Microbiology: Recent Results (from the past 240 hour(s))  Gastrointestinal Panel by PCR , Stool     Status: None    Collection Time: 10/28/15  7:22 AM  Result Value Ref Range Status   Campylobacter species NOT DETECTED NOT DETECTED Final   Plesimonas shigelloides NOT DETECTED NOT DETECTED Final   Salmonella species NOT DETECTED NOT DETECTED Final   Yersinia enterocolitica NOT DETECTED NOT DETECTED Final   Vibrio species NOT DETECTED NOT DETECTED Final   Vibrio cholerae NOT DETECTED NOT DETECTED Final   Enteroaggregative E coli (EAEC) NOT DETECTED NOT DETECTED Final   Enteropathogenic E coli (EPEC) NOT DETECTED NOT DETECTED Final   Enterotoxigenic E coli (ETEC) NOT DETECTED NOT DETECTED Final   Shiga like toxin producing E coli (STEC) NOT DETECTED NOT DETECTED Final   E. coli O157 NOT DETECTED NOT DETECTED Final   Shigella/Enteroinvasive E coli (EIEC) NOT DETECTED NOT DETECTED Final   Cryptosporidium NOT DETECTED NOT DETECTED Final   Cyclospora cayetanensis NOT DETECTED NOT DETECTED Final   Entamoeba histolytica NOT DETECTED NOT DETECTED Final   Giardia lamblia NOT DETECTED NOT  DETECTED Final   Adenovirus F40/41 NOT DETECTED NOT DETECTED Final   Astrovirus NOT DETECTED NOT DETECTED Final   Norovirus GI/GII NOT DETECTED NOT DETECTED Final   Rotavirus A NOT DETECTED NOT DETECTED Final   Sapovirus (I, II, IV, and V) NOT DETECTED NOT DETECTED Final  C difficile quick scan w PCR reflex     Status: None   Collection Time: 10/28/15  7:22 AM  Result Value Ref Range Status   C Diff antigen NEGATIVE NEGATIVE Final   C Diff toxin NEGATIVE NEGATIVE Final   C Diff interpretation No C. difficile detected.  Final     Labs: BNP (last 3 results) No results for input(s): BNP in the last 8760 hours. Basic Metabolic Panel:  Recent Labs Lab 10/27/15 2350 10/28/15 0617 10/29/15 0540  NA 135 142 138  K 4.7 4.3 4.2  CL 104 113* 109  CO2 23 23 21*  GLUCOSE 388* 293* 250*  BUN 13 9 5*  CREATININE 1.07* 0.77 0.78  CALCIUM 10.1 9.0 9.0   Liver Function Tests:  Recent Labs Lab 10/27/15 2350 10/28/15 0617   AST 20 18  ALT 21 17  ALKPHOS 93 76  BILITOT 0.7 0.2*  PROT 6.8 5.8*  ALBUMIN 3.8 3.2*    Recent Labs Lab 10/27/15 2350  LIPASE 27   No results for input(s): AMMONIA in the last 168 hours. CBC:  Recent Labs Lab 10/27/15 2350 10/28/15 0617 10/29/15 0540  WBC 8.1 7.0 8.4  NEUTROABS  --  4.9  --   HGB 14.5 13.0 12.9  HCT 43.5 40.1 39.7  MCV 88.4 91.1 90.8  PLT 171 156 178   Cardiac Enzymes: No results for input(s): CKTOTAL, CKMB, CKMBINDEX, TROPONINI in the last 168 hours. BNP: Invalid input(s): POCBNP CBG:  Recent Labs Lab 10/30/15 0726 10/30/15 1147 10/30/15 1703 10/30/15 2113 10/31/15 0744  GLUCAP 160* 195* 196* 175* 138*   D-Dimer No results for input(s): DDIMER in the last 72 hours. Hgb A1c No results for input(s): HGBA1C in the last 72 hours. Lipid Profile No results for input(s): CHOL, HDL, LDLCALC, TRIG, CHOLHDL, LDLDIRECT in the last 72 hours. Thyroid function studies No results for input(s): TSH, T4TOTAL, T3FREE, THYROIDAB in the last 72 hours.  Invalid input(s): FREET3 Anemia work up No results for input(s): VITAMINB12, FOLATE, FERRITIN, TIBC, IRON, RETICCTPCT in the last 72 hours. Urinalysis    Component Value Date/Time   COLORURINE YELLOW 10/27/2015 0108   APPEARANCEUR CLEAR 10/27/2015 0108   LABSPEC 1.018 10/27/2015 0108   PHURINE 7.0 10/27/2015 0108   GLUCOSEU >1000 (A) 10/27/2015 0108   GLUCOSEU Negative 09/28/2011 1634   HGBUR NEGATIVE 10/27/2015 0108   BILIRUBINUR NEGATIVE 10/27/2015 0108   BILIRUBINUR neg 09/18/2011 1606   KETONESUR 15 (A) 10/27/2015 0108   PROTEINUR NEGATIVE 10/27/2015 0108   UROBILINOGEN 0.2 12/30/2014 2000   NITRITE NEGATIVE 10/27/2015 0108   LEUKOCYTESUR NEGATIVE 10/27/2015 0108   Sepsis Labs Invalid input(s): PROCALCITONIN,  WBC,  LACTICIDVEN Microbiology Recent Results (from the past 240 hour(s))  Gastrointestinal Panel by PCR , Stool     Status: None   Collection Time: 10/28/15  7:22 AM  Result  Value Ref Range Status   Campylobacter species NOT DETECTED NOT DETECTED Final   Plesimonas shigelloides NOT DETECTED NOT DETECTED Final   Salmonella species NOT DETECTED NOT DETECTED Final   Yersinia enterocolitica NOT DETECTED NOT DETECTED Final   Vibrio species NOT DETECTED NOT DETECTED Final   Vibrio cholerae NOT DETECTED NOT DETECTED Final  Enteroaggregative E coli (EAEC) NOT DETECTED NOT DETECTED Final   Enteropathogenic E coli (EPEC) NOT DETECTED NOT DETECTED Final   Enterotoxigenic E coli (ETEC) NOT DETECTED NOT DETECTED Final   Shiga like toxin producing E coli (STEC) NOT DETECTED NOT DETECTED Final   E. coli O157 NOT DETECTED NOT DETECTED Final   Shigella/Enteroinvasive E coli (EIEC) NOT DETECTED NOT DETECTED Final   Cryptosporidium NOT DETECTED NOT DETECTED Final   Cyclospora cayetanensis NOT DETECTED NOT DETECTED Final   Entamoeba histolytica NOT DETECTED NOT DETECTED Final   Giardia lamblia NOT DETECTED NOT DETECTED Final   Adenovirus F40/41 NOT DETECTED NOT DETECTED Final   Astrovirus NOT DETECTED NOT DETECTED Final   Norovirus GI/GII NOT DETECTED NOT DETECTED Final   Rotavirus A NOT DETECTED NOT DETECTED Final   Sapovirus (I, II, IV, and V) NOT DETECTED NOT DETECTED Final  C difficile quick scan w PCR reflex     Status: None   Collection Time: 10/28/15  7:22 AM  Result Value Ref Range Status   C Diff antigen NEGATIVE NEGATIVE Final   C Diff toxin NEGATIVE NEGATIVE Final   C Diff interpretation No C. difficile detected.  Final     Time coordinating discharge: <30 minutes  SIGNED:   Louellen Molder, MD  [facility] 10/31/2015, 10:47 AM Pager   If 7PM-7AM, please contact night-coverage [email or web address] Password [email or web address]

## 2015-11-01 ENCOUNTER — Telehealth: Payer: Self-pay | Admitting: Gastroenterology

## 2015-11-01 LAB — HEMOGLOBIN A1C
Hgb A1c MFr Bld: 9 % — ABNORMAL HIGH (ref 4.8–5.6)
Mean Plasma Glucose: 212 mg/dL

## 2015-11-01 NOTE — Telephone Encounter (Signed)
Left message on machine to call back   Per Dr Fuller Plan the pt should follow up with Dr Loletha Carrow, Alonza Bogus set appt up for 12/06/15.  Dr Fuller Plan is aware and the appt is within an acceptable time.

## 2015-11-04 ENCOUNTER — Telehealth: Payer: Self-pay | Admitting: Gastroenterology

## 2015-11-04 NOTE — Telephone Encounter (Signed)
The pt was concerned that the appt for 12/06/15 was to far out.  I advised her that pr Dr Silvio Pate consult note he is aware of the Sept. Appt.  She continues to have cramping and will finish abx on Thursday.  She has been taking bentyl as directed.  She was added to the wait list and will be called if we have any cancellations  Pt advised to call back if her symptoms worsen or change.  Pt verbalized instructions.

## 2015-11-06 DIAGNOSIS — K529 Noninfective gastroenteritis and colitis, unspecified: Secondary | ICD-10-CM | POA: Diagnosis not present

## 2015-11-06 DIAGNOSIS — E119 Type 2 diabetes mellitus without complications: Secondary | ICD-10-CM | POA: Diagnosis not present

## 2015-11-06 DIAGNOSIS — Z7984 Long term (current) use of oral hypoglycemic drugs: Secondary | ICD-10-CM | POA: Diagnosis not present

## 2015-11-06 DIAGNOSIS — F319 Bipolar disorder, unspecified: Secondary | ICD-10-CM | POA: Diagnosis not present

## 2015-11-06 DIAGNOSIS — F332 Major depressive disorder, recurrent severe without psychotic features: Secondary | ICD-10-CM | POA: Diagnosis not present

## 2015-11-06 DIAGNOSIS — Z09 Encounter for follow-up examination after completed treatment for conditions other than malignant neoplasm: Secondary | ICD-10-CM | POA: Diagnosis not present

## 2015-12-03 DIAGNOSIS — Z79899 Other long term (current) drug therapy: Secondary | ICD-10-CM | POA: Diagnosis not present

## 2015-12-06 ENCOUNTER — Ambulatory Visit (INDEPENDENT_AMBULATORY_CARE_PROVIDER_SITE_OTHER): Payer: Medicare Other | Admitting: Gastroenterology

## 2015-12-06 ENCOUNTER — Encounter: Payer: Self-pay | Admitting: Gastroenterology

## 2015-12-06 VITALS — BP 124/90 | HR 58 | Ht 60.5 in | Wt 169.0 lb

## 2015-12-06 DIAGNOSIS — R1032 Left lower quadrant pain: Secondary | ICD-10-CM

## 2015-12-06 DIAGNOSIS — K559 Vascular disorder of intestine, unspecified: Secondary | ICD-10-CM

## 2015-12-06 DIAGNOSIS — K589 Irritable bowel syndrome without diarrhea: Secondary | ICD-10-CM

## 2015-12-06 NOTE — Patient Instructions (Addendum)
If you are age 68 or older, your body mass index should be between 23-30. Your Body mass index is 32.46 kg/m. If this is out of the aforementioned range listed, please consider follow up with your Primary Care Provider.  If you are age 108 or younger, your body mass index should be between 19-25. Your Body mass index is 32.46 kg/m. If this is out of the aformentioned range listed, please consider follow up with your Primary Care Provider.   Food Guidelines for a sensitive Stomach  Many people have difficulty digesting certain foods, causing a variety of distressing and embarrassing symptoms such as abdominal pain, bloating and gas.  These foods may need to be avoided or consumed in small amounts.  Here are some tips that might be helpful for you.  1.   Lactose intolerance is the difficulty or complete inability to digest lactose, the natural sugar in milk and anything made from milk.  This condition is harmless, common, and can begin any time during life.  Some people can digest a modest amount of lactose while others cannot tolerate any.  Also, not all dairy products contain equal amounts of lactose.  For example, hard cheeses such as parmesan have less lactose than soft cheeses such as cheddar.  Yogurt has less lactose than milk or cheese.  Many packaged foods (even many brands of bread) have milk, so read ingredient lists carefully.  It is difficult to test for lactose intolerance, so just try avoiding lactose as much as possible for a week and see what happens with your symptoms.  If you seem to be lactose intolerant, the best plan is to avoid it (but make sure you get calcium from another source).  The next best thing is to use lactase enzyme supplements, available over the counter everywhere.  Just know that many lactose intolerant people need to take several tablets with each serving of dairy to avoid symptoms.  Lastly, a lot of restaurant food is made with milk or butter.  Many are things you might  not suspect, such as mashed potatoes, rice and pasta (cooked with butter) and "grilled" items.  If you are lactose intolerant, it never hurts to ask your server what has milk or butter.  2.   Fiber is an important part of your diet, but not all fiber is well-tolerated.  Insoluble fiber such as bran is often consumed by normal gut bacteria and converted into gas.  Soluble fiber such as oats, squash, carrots and green beans are typically tolerated better.  3.   Some types of carbohydrates can be poorly digested.  Examples include: fructose (apples, cherries, pears, raisins and other dried fruits), fructans (onions, zucchini, large amounts of wheat), sorbitol/mannitol/xylitol and sucralose/Splenda (common artificial sweeteners), and raffinose (lentils, broccoli, cabbage, asparagus, brussel sprouts, many types of beans).  Do a Development worker, community for [employer] and you will find helpful information. Beano, a dietary supplement, will often help with raffinose-containing foods.  As with lactase tablets, you may need several per serving.  4.   Whenever possible, avoid processed food&meats and chemical additives.  High fructose corn syrup, a common sweetener, may be difficult to digest.  Eggs and soy (comes from the soybean, and added to many foods now) are the other most common bloating/gassy foods.  - Dr. Herma [facility]

## 2015-12-06 NOTE — Progress Notes (Signed)
[facility] Progress Note  Chief Complaint: Abdominal pain and diarrhea  Subjective  History:  [name] [name] was sent back to see me after recent hospital stay. See my 08/15/2015 office note for details of her history. [name] [name] was admitted to the hospital last month with a few days of worsening severe crampy left lower quadrant pain and bloody diarrhea. CT angiogram of the abdomen showed thickening in the sigmoid colon with no adjacent stranding. There was also suggestion of some distal ileal thickening, but there is no contrast in that area. I reviewed Dr. Silvio Pate consult note, he also agrees that the ileal findings were unconvincing. All of this was apparently worrisome for another episode of ischemic colitis and a colonoscopy was initially planned. [name] [name] says that she was infarcting much pain and so she declined to go through with a colonoscopy. The bleeding resolved after a few days, her left lower quadrant pain is improving but still present. He still has the same abdominal bloating gas alternating constipation with diarrhea and intermittent analyzed abdominal cramps. Also reports that she has some new medicines recently, but did not bring them in cannot recall what they were. Thinks that some of the more for her chronic anxiety. ROS: She denies chest pain or dyspnea [place] chronic severe anxiety  The patient's Past Medical, Family and Social History were reviewed and are on file in the EMR.  Objective:  Med list reviewed  Vital signs in last 24 hrs: Vitals:   12/06/15 1607  BP: 124/90  Pulse: (!) 58    Physical Exam  Is slightly tremulous and visibly very anxious  HEENT: sclera anicteric, oral mucosa moist without lesions  Neck: supple, no thyromegaly, JVD or lymphadenopathy  Cardiac: RRR without murmurs, S1S2 heard, no peripheral edema  Pulm: clear to auscultation bilaterally, normal RR and effort noted  Abdomen: soft, mild scattered tenderness throughout the abdomen, with  active bowel sounds. No guarding or palpable hepatosplenomegaly.  Skin; warm and dry, no jaundice or rash  CBC    Component Value Date/Time   WBC 8.4 10/29/2015 0540   RBC 4.37 10/29/2015 0540   HGB 12.9 10/29/2015 0540   HCT 39.7 10/29/2015 0540   PLT 178 10/29/2015 0540   MCV 90.8 10/29/2015 0540   MCH 29.5 10/29/2015 0540   MCHC 32.5 10/29/2015 0540   RDW 13.5 10/29/2015 0540   LYMPHSABS 1.6 10/28/2015 0617   MONOABS 0.5 10/28/2015 0617   EOSABS 0.0 10/28/2015 0617   BASOSABS 0.0 10/28/2015 0617    Radiologic studies:  See recent CTA abdomen - ? Ileitis and colitis   I personally reviewed these images.  @ASSESSMENTPLANBEGIN @ Assessment: Encounter Diagnoses  Name Primary?  . IBS (irritable bowel syndrome) Yes  . Ischemic colitis ([place])   . LLQ abdominal pain    She appears to have likely suffered another episode of ischemic colitis, the cause of which is unclear. She does not have peripheral vascular disease, and none of her medicines seem like obvious triggers for this. She did not have a documented episode of hypotension. In addition, she had at least 2 previous episodes of ischemic colitis 3-5 years ago. I wonder if it may be precipitated by chronic constipation which is why I am reluctant to give her potent antidiarrheal medicines now.  I think she has persistent abdominal pain altered bowel habits due to her underlying IBS, and this recent colitis is a second condition.  Plan: She did not seem to improve with a trial of Levbid recently, and again, I  do not want to give her more potent antidiarrheal meds. She will take a daily fiber supplement in hopes that it will regulate her bowel habits. She does not have a bowel movement for at least 24 hours, she will take one half capful of MiraLAX. I think a greater dosed and that will likely precipitate diarrhea. I done my best to reassure her that her colon has probably almost completely healed from this colitis by now. In  this same situation 2 years ago, Dr. Olevia Perches wondered whether she might have Crohn's disease rather than ischemic colitis. However, colonoscopy to the cecum at that time was normal. This seems to be an almost identical situation, thus I am not planning to pursue a colonoscopy right now. If another episode of probable ischemic colitis occurs, it would really be ideal if she had a sigmoidoscopy or colonoscopy at that time. I am hopeful that she might eventually get some relief from her chronic debilitating anxiety, which is certainly exacerbating her IBS.  Total time 30 minutes, over half spent in counseling and coordination of care.   [name]

## 2015-12-16 DIAGNOSIS — I10 Essential (primary) hypertension: Secondary | ICD-10-CM | POA: Diagnosis not present

## 2015-12-16 DIAGNOSIS — R51 Headache: Secondary | ICD-10-CM | POA: Diagnosis not present

## 2015-12-16 DIAGNOSIS — R197 Diarrhea, unspecified: Secondary | ICD-10-CM | POA: Diagnosis not present

## 2015-12-17 DIAGNOSIS — R197 Diarrhea, unspecified: Secondary | ICD-10-CM | POA: Diagnosis not present

## 2015-12-21 DIAGNOSIS — M5030 Other cervical disc degeneration, unspecified cervical region: Secondary | ICD-10-CM | POA: Diagnosis not present

## 2015-12-21 DIAGNOSIS — F3160 Bipolar disorder, current episode mixed, unspecified: Secondary | ICD-10-CM | POA: Diagnosis not present

## 2015-12-21 DIAGNOSIS — E119 Type 2 diabetes mellitus without complications: Secondary | ICD-10-CM | POA: Diagnosis not present

## 2015-12-21 DIAGNOSIS — G44209 Tension-type headache, unspecified, not intractable: Secondary | ICD-10-CM | POA: Diagnosis not present

## 2015-12-26 DIAGNOSIS — F331 Major depressive disorder, recurrent, moderate: Secondary | ICD-10-CM | POA: Diagnosis not present

## 2015-12-26 DIAGNOSIS — M4722 Other spondylosis with radiculopathy, cervical region: Secondary | ICD-10-CM | POA: Diagnosis not present

## 2015-12-30 ENCOUNTER — Encounter (HOSPITAL_COMMUNITY): Payer: Self-pay | Admitting: Emergency Medicine

## 2015-12-30 ENCOUNTER — Emergency Department (HOSPITAL_COMMUNITY)
Admission: EM | Admit: 2015-12-30 | Discharge: 2015-12-31 | Disposition: A | Discharge status: Other Acute Inpatient Hospital | Payer: Medicare Other | Attending: Emergency Medicine | Admitting: Emergency Medicine

## 2015-12-30 DIAGNOSIS — N3000 Acute cystitis without hematuria: Secondary | ICD-10-CM | POA: Insufficient documentation

## 2015-12-30 DIAGNOSIS — E039 Hypothyroidism, unspecified: Secondary | ICD-10-CM | POA: Insufficient documentation

## 2015-12-30 DIAGNOSIS — R45851 Suicidal ideations: Secondary | ICD-10-CM

## 2015-12-30 DIAGNOSIS — F332 Major depressive disorder, recurrent severe without psychotic features: Secondary | ICD-10-CM | POA: Diagnosis present

## 2015-12-30 DIAGNOSIS — F329 Major depressive disorder, single episode, unspecified: Secondary | ICD-10-CM | POA: Diagnosis not present

## 2015-12-30 DIAGNOSIS — Z049 Encounter for examination and observation for unspecified reason: Secondary | ICD-10-CM

## 2015-12-30 DIAGNOSIS — Z79899 Other long term (current) drug therapy: Secondary | ICD-10-CM | POA: Diagnosis not present

## 2015-12-30 DIAGNOSIS — Z7984 Long term (current) use of oral hypoglycemic drugs: Secondary | ICD-10-CM | POA: Insufficient documentation

## 2015-12-30 DIAGNOSIS — Z833 Family history of diabetes mellitus: Secondary | ICD-10-CM | POA: Diagnosis not present

## 2015-12-30 DIAGNOSIS — Z813 Family history of other psychoactive substance abuse and dependence: Secondary | ICD-10-CM | POA: Diagnosis not present

## 2015-12-30 DIAGNOSIS — I10 Essential (primary) hypertension: Secondary | ICD-10-CM | POA: Diagnosis not present

## 2015-12-30 DIAGNOSIS — E119 Type 2 diabetes mellitus without complications: Secondary | ICD-10-CM | POA: Diagnosis not present

## 2015-12-30 DIAGNOSIS — R05 Cough: Secondary | ICD-10-CM | POA: Diagnosis not present

## 2015-12-30 DIAGNOSIS — Z811 Family history of alcohol abuse and dependence: Secondary | ICD-10-CM | POA: Diagnosis not present

## 2015-12-30 DIAGNOSIS — E860 Dehydration: Secondary | ICD-10-CM | POA: Insufficient documentation

## 2015-12-30 LAB — CBC
HCT: 43.4 % (ref 36.0–46.0)
Hemoglobin: 14.5 g/dL (ref 12.0–15.0)
MCH: 30.4 pg (ref 26.0–34.0)
MCHC: 33.4 g/dL (ref 30.0–36.0)
MCV: 91 fL (ref 78.0–100.0)
Platelets: 202 10*3/uL (ref 150–400)
RBC: 4.77 MIL/uL (ref 3.87–5.11)
RDW: 12.9 % (ref 11.5–15.5)
WBC: 12 10*3/uL — ABNORMAL HIGH (ref 4.0–10.5)

## 2015-12-30 LAB — URINALYSIS, ROUTINE W REFLEX MICROSCOPIC
Bilirubin Urine: NEGATIVE
Glucose, UA: 250 mg/dL — AB
Ketones, ur: NEGATIVE mg/dL
Nitrite: NEGATIVE
Protein, ur: 30 mg/dL — AB
Specific Gravity, Urine: 1.018 (ref 1.005–1.030)
pH: 6 (ref 5.0–8.0)

## 2015-12-30 LAB — BASIC METABOLIC PANEL
Anion gap: 6 (ref 5–15)
BUN: 10 mg/dL (ref 6–20)
CO2: 19 mmol/L — ABNORMAL LOW (ref 22–32)
Calcium: 8.5 mg/dL — ABNORMAL LOW (ref 8.9–10.3)
Chloride: 114 mmol/L — ABNORMAL HIGH (ref 101–111)
Creatinine, Ser: 0.72 mg/dL (ref 0.44–1.00)
GFR calc Af Amer: >60 mL/min (ref >60)
GFR calc non Af Amer: >60 mL/min (ref >60)
Glucose, Bld: 142 mg/dL — ABNORMAL HIGH (ref 65–99)
Potassium: 3.9 mmol/L (ref 3.5–5.1)
Sodium: 139 mmol/L (ref 135–145)

## 2015-12-30 LAB — URINE MICROSCOPIC-ADD ON

## 2015-12-30 LAB — COMPREHENSIVE METABOLIC PANEL
ALT: 28 U/L (ref 14–54)
AST: 29 U/L (ref 15–41)
Albumin: 4.2 g/dL (ref 3.5–5.0)
Alkaline Phosphatase: 105 U/L (ref 38–126)
Anion gap: 11 (ref 5–15)
BUN: 12 mg/dL (ref 6–20)
CO2: 18 mmol/L — ABNORMAL LOW (ref 22–32)
Calcium: 9.8 mg/dL (ref 8.9–10.3)
Chloride: 106 mmol/L (ref 101–111)
Creatinine, Ser: 0.92 mg/dL (ref 0.44–1.00)
GFR calc Af Amer: >60 mL/min (ref >60)
GFR calc non Af Amer: >60 mL/min (ref >60)
Glucose, Bld: 245 mg/dL — ABNORMAL HIGH (ref 65–99)
Potassium: 3.8 mmol/L (ref 3.5–5.1)
Sodium: 135 mmol/L (ref 135–145)
Total Bilirubin: 0.5 mg/dL (ref 0.3–1.2)
Total Protein: 7 g/dL (ref 6.5–8.1)

## 2015-12-30 LAB — SALICYLATE LEVEL: Salicylate Lvl: <7 mg/dL (ref 2.8–30.0)

## 2015-12-30 LAB — RAPID URINE DRUG SCREEN, HOSP PERFORMED
Amphetamines: NOT DETECTED
Barbiturates: NOT DETECTED
Benzodiazepines: POSITIVE — AB
Cocaine: NOT DETECTED
Opiates: NOT DETECTED
Tetrahydrocannabinol: NOT DETECTED

## 2015-12-30 LAB — ACETAMINOPHEN LEVEL: Acetaminophen (Tylenol), Serum: <10 ug/mL — ABNORMAL LOW (ref 10–30)

## 2015-12-30 LAB — ETHANOL: Alcohol, Ethyl (B): <5 mg/dL (ref <5)

## 2015-12-30 MED ORDER — MEMANTINE HCL 5 MG PO TABS
5.0000 mg | ORAL_TABLET | Freq: Every evening | ORAL | Status: DC
  Administered 2015-12-31: 5 mg via ORAL
  Filled 2015-12-30: qty 1

## 2015-12-30 MED ORDER — ACETAMINOPHEN 500 MG PO TABS
1000.0000 mg | ORAL_TABLET | Freq: Four times a day (QID) | ORAL | Status: DC | PRN
  Administered 2015-12-31: 1000 mg via ORAL
  Filled 2015-12-30: qty 2

## 2015-12-30 MED ORDER — ROPINIROLE HCL 1 MG PO TABS
1.5000 mg | ORAL_TABLET | Freq: Once | ORAL | Status: DC
  Filled 2015-12-30: qty 1

## 2015-12-30 MED ORDER — GABAPENTIN 100 MG PO CAPS
100.0000 mg | ORAL_CAPSULE | Freq: Three times a day (TID) | ORAL | Status: DC
  Administered 2015-12-30 – 2015-12-31 (×3): 100 mg via ORAL
  Filled 2015-12-30 (×3): qty 1

## 2015-12-30 MED ORDER — GLIPIZIDE ER 10 MG PO TB24
10.0000 mg | ORAL_TABLET | Freq: Every day | ORAL | Status: DC
  Administered 2015-12-31: 10 mg via ORAL
  Filled 2015-12-30: qty 1

## 2015-12-30 MED ORDER — SODIUM CHLORIDE 0.9 % IV BOLUS (SEPSIS)
1000.0000 mL | Freq: Once | INTRAVENOUS | Status: AC
  Administered 2015-12-30: 1000 mL via INTRAVENOUS

## 2015-12-30 MED ORDER — LIOTHYRONINE SODIUM 25 MCG PO TABS
25.0000 ug | ORAL_TABLET | Freq: Every day | ORAL | Status: DC
  Administered 2015-12-31: 25 ug via ORAL
  Filled 2015-12-30: qty 1

## 2015-12-30 MED ORDER — L-METHYLFOLATE CALCIUM 15 MG PO TABS: 15.0000 mg | ORAL_TABLET | Freq: Every morning | ORAL | Status: DC

## 2015-12-30 MED ORDER — LORAZEPAM 1 MG PO TABS
1.0000 mg | ORAL_TABLET | Freq: Three times a day (TID) | ORAL | Status: DC
  Administered 2015-12-30 – 2015-12-31 (×3): 1 mg via ORAL
  Filled 2015-12-30 (×3): qty 1

## 2015-12-30 MED ORDER — LORAZEPAM 1 MG PO TABS
1.0000 mg | ORAL_TABLET | Freq: Once | ORAL | Status: AC
  Administered 2015-12-30: 1 mg via ORAL
  Filled 2015-12-30: qty 1

## 2015-12-30 MED ORDER — IBUPROFEN 200 MG PO TABS
600.0000 mg | ORAL_TABLET | Freq: Once | ORAL | Status: AC
  Administered 2015-12-30: 600 mg via ORAL
  Filled 2015-12-30: qty 3

## 2015-12-30 MED ORDER — VITAMIN D 1000 UNITS PO TABS
2000.0000 [IU] | ORAL_TABLET | Freq: Every day | ORAL | Status: DC
  Administered 2015-12-31: 2000 [IU] via ORAL
  Filled 2015-12-30: qty 2

## 2015-12-30 MED ORDER — DEXTROSE 5 % IV SOLN
1.0000 g | Freq: Once | INTRAVENOUS | Status: AC
  Administered 2015-12-30: 1 g via INTRAVENOUS
  Filled 2015-12-30: qty 10

## 2015-12-30 MED ORDER — CEPHALEXIN 500 MG PO CAPS
500.0000 mg | ORAL_CAPSULE | Freq: Four times a day (QID) | ORAL | Status: DC
  Administered 2015-12-31 (×4): 500 mg via ORAL
  Filled 2015-12-30 (×4): qty 1

## 2015-12-30 MED ORDER — LITHIUM CARBONATE 300 MG PO TABS
600.0000 mg | ORAL_TABLET | Freq: Every evening | ORAL | Status: DC
  Filled 2015-12-30: qty 2

## 2015-12-30 MED ORDER — CARBAMAZEPINE ER 400 MG PO TB12
400.0000 mg | ORAL_TABLET | Freq: Every day | ORAL | Status: DC
  Administered 2015-12-30: 400 mg via ORAL
  Filled 2015-12-30 (×2): qty 1

## 2015-12-30 MED ORDER — QUETIAPINE FUMARATE 100 MG PO TABS
200.0000 mg | ORAL_TABLET | Freq: Two times a day (BID) | ORAL | Status: DC
  Administered 2015-12-31: 200 mg via ORAL
  Filled 2015-12-30 (×2): qty 2

## 2015-12-30 MED ORDER — ROSUVASTATIN CALCIUM 10 MG PO TABS
10.0000 mg | ORAL_TABLET | Freq: Every day | ORAL | Status: DC
  Administered 2015-12-30: 10 mg via ORAL
  Filled 2015-12-30 (×2): qty 1

## 2015-12-30 MED ORDER — LEVOTHYROXINE SODIUM 50 MCG PO TABS
50.0000 ug | ORAL_TABLET | Freq: Every day | ORAL | Status: DC
  Administered 2015-12-31: 50 ug via ORAL
  Filled 2015-12-30: qty 1

## 2015-12-30 MED ORDER — ONDANSETRON HCL 4 MG/2ML IJ SOLN
4.0000 mg | Freq: Once | INTRAMUSCULAR | Status: AC
  Administered 2015-12-30: 4 mg via INTRAVENOUS
  Filled 2015-12-30: qty 2

## 2015-12-30 MED ORDER — ONDANSETRON HCL 4 MG PO TABS: 4.0000 mg | ORAL_TABLET | Freq: Two times a day (BID) | ORAL | Status: DC | PRN

## 2015-12-30 MED ORDER — LISINOPRIL 10 MG PO TABS
10.0000 mg | ORAL_TABLET | Freq: Every day | ORAL | Status: DC
  Administered 2015-12-31: 10 mg via ORAL
  Filled 2015-12-30: qty 1

## 2015-12-30 MED ORDER — ROPINIROLE HCL 1 MG PO TABS
1.5000 mg | ORAL_TABLET | Freq: Every day | ORAL | Status: DC
  Administered 2015-12-31: 1.5 mg via ORAL
  Filled 2015-12-30: qty 1

## 2015-12-30 NOTE — ED Notes (Signed)
Lab requested another urine sample

## 2015-12-30 NOTE — ED Notes (Signed)
Pt stated she cannot take her seroquel without 4 complete mg of requip.  Held medication due to refusal.  Pt currently speaking with TTS through telepsych

## 2015-12-30 NOTE — ED Provider Notes (Signed)
[facility] DEPT Provider Note   CSN: [id number] Arrival date & time: 12/30/15  1614     History   Chief Complaint Chief Complaint  Patient presents with  . Suicidal    HPI [name] [name] is a 70 y.o. female.   Depression  This is a chronic problem. The current episode started less than 1 hour ago. The problem occurs constantly. The problem has been rapidly worsening. Pertinent negatives include no chest pain, no abdominal pain, no headaches and no shortness of breath. Nothing aggravates the symptoms. Nothing relieves the symptoms. She has tried nothing for the symptoms. The treatment provided no relief.    Past Medical History:  Diagnosis Date  . Alcohol abuse   . Anxiety    takes Valium daily as needed and Ativan daily  . Bilateral hearing loss   . Bipolar 1 disorder ([place])    takes Lithium nightly and Synthroid daily  . Chronic back pain    DDD  . Colitis, ischemic ([place]) 2012  . Confusion    r/t meds  . Depression    takes Prozac daily and Bupspirone   . Diabetes ([place])   . Diabetes mellitus without complication ([place])    takes Janumet daily  . Diverticulosis   . Diverticulosis   . Dyslipidemia    takes Crestor daily  . Fibromyalgia   . Hepatic steatosis 06/18/12   severe  . History of bronchitis    last time at least 49yrs ago  . Hyperlipidemia   . Hypothyroidism   . IBS (irritable bowel syndrome)   . Ischemic colitis ([place])   . Joint pain   . Joint swelling   . Lupus    tumid-skin  . Nephrolithiasis   . Numbness    in right foot  . Osteoarthritis    in hips  . Osteoarthritis cervical spine   . Osteoarthritis of hand    bilateral  . Restless leg syndrome    takes Requip nightly  . Sciatica   . Urinary frequency   . Urinary leakage   . Urinary urgency   . Urinary, incontinence, stress female   . Walking pneumonia    last time more than 104yrs ago    Patient Active Problem List   Diagnosis Date Noted  . Nausea without vomiting   .  Colitis 10/28/2015  . Unintentional poisoning by psychotropic drug 07/05/2015  . Restless leg syndrome 07/05/2015  . Diabetic polyneuropathy associated with type 2 diabetes mellitus ([place]) 09/21/2014  . Arthritis of left hip 06/09/2013  . Arthritis of right hip 06/08/2013  . Hip pain, right 05/02/2013  . IBS (irritable bowel syndrome) 01/02/2013  . Unspecified vitamin D deficiency 07/05/2012  . Pure hyperglyceridemia 07/04/2012  . Diabetes mellitus with diabetic neuropathy, without long-term current use of insulin ([place]) 06/18/2012  . Hypertension 06/18/2012  . Bipolar 1 disorder ([place]) 06/18/2012  . Low back pain 02/25/2011  . Acute ischemic colitis ([place]) 01/06/2011  . Sciatica 12/19/2010  . Fibromyalgia 10/28/2010  . Primary osteoarthritis of right hip 10/24/2010  . Hyperlipidemia LDL goal < 100 05/05/2010  . DEPRESSION/ANXIETY 05/05/2010  . ABUSE, ALCOHOL, IN REMISSION 05/05/2010  . OTITIS MEDIA, CHRONIC 05/05/2010  . HEARING LOSS, BILATERAL 05/05/2010  . ALLERGIC RHINITIS, SEASONAL, MILD 05/05/2010  . URINARY INCONTINENCE, STRESS, FEMALE 05/05/2010  . OSTEOARTHRITIS, HANDS, BILATERAL 05/05/2010  . OSTEOARTHRITIS, CERVICAL SPINE 05/05/2010    Past Surgical History:  Procedure Laterality Date  . ABDOMINAL HYSTERECTOMY    . APPENDECTOMY    .  COLONOSCOPY    . D&C x 4    . DENTAL SURGERY Left 2 weeks ago    dental implant  . ESOPHAGOGASTRODUODENOSCOPY    . excision of tumors at right neck     angle of jaw '68, benign  . FLEXIBLE SIGMOIDOSCOPY N/A 06/21/2012   Procedure: FLEXIBLE SIGMOIDOSCOPY;  Surgeon: Jerene Bears, MD;  Location: [facility];  Service: Gastroenterology;  Laterality: N/A;  . TONSILLECTOMY    . TOTAL HIP ARTHROPLASTY Right 06/09/2013   Procedure: TOTAL HIP ARTHROPLASTY;  Surgeon: Kerin Salen, MD;  Location: [facility];  Service: Orthopedics;  Laterality: Right;  . TUBAL LIGATION      OB History    No data available       Home Medications    Prior to  Admission medications   Medication Sig Start Date End Date Taking? Authorizing Provider  acetaminophen (TYLENOL) 500 MG tablet Take 1,000 mg by mouth every 6 (six) hours as needed for mild pain, moderate pain, fever or headache.   Yes Historical Provider, MD  Biotin (BIOTIN 5000) 5 MG CAPS Take 5,000 mg by mouth daily.   Yes Historical Provider, MD  carbamazepine (TEGRETOL XR) 200 MG 12 hr tablet Take 400 mg by mouth at bedtime. Eventually to increase to 600mg  at bedtime, 11/19/15  Yes Historical Provider, MD  cholecalciferol (VITAMIN D) 1000 units tablet Take 2,000 Units by mouth daily.    Yes Historical Provider, MD  gabapentin (NEURONTIN) 100 MG capsule Take 1 capsule (100 mg total) by mouth 3 (three) times daily. Patient taking differently: Take 100-200 mg by mouth 4 (four) times daily. Take 100mg  TID, and then take 200mg  at night 07/06/15  Yes Charlynne Cousins, MD  glipiZIDE (GLUCOTROL XL) 10 MG 24 hr tablet Take 10 mg by mouth daily with breakfast.   Yes Historical Provider, MD  L-methylfolate Calcium 15 MG TABS Take 15 mg by mouth every morning.  10/21/15  Yes Historical Provider, MD  levothyroxine (SYNTHROID, LEVOTHROID) 50 MCG tablet Take 50 mcg by mouth daily before breakfast.   Yes Historical Provider, MD  liothyronine (CYTOMEL) 25 MCG tablet Take 25 mcg by mouth daily.   Yes Historical Provider, MD  lisinopril (PRINIVIL,ZESTRIL) 10 MG tablet Take 10 mg by mouth daily.   Yes Historical Provider, MD  lithium 300 MG tablet Take 600 mg by mouth every evening.    Yes Historical Provider, MD  LORazepam (ATIVAN) 1 MG tablet Take 1 mg by mouth 3 (three) times daily.   Yes Historical Provider, MD  memantine (NAMENDA) 5 MG tablet Take 5 mg by mouth every evening.   Yes Historical Provider, MD  ondansetron (ZOFRAN) 4 MG tablet Take 1 tablet by mouth 2 (two) times daily as needed for nausea. 08/10/15  Yes Historical Provider, MD  QUEtiapine (SEROQUEL) 200 MG tablet Take 200-400 mg by mouth 2 (two)  times daily. Takes 200mg  at 1630 and 400mg  at qhs 06/10/15  Yes Historical Provider, MD  rOPINIRole (REQUIP) 0.5 MG tablet Take 3 tablets (1.5 mg total) by mouth daily. Patient taking differently: Take 2 mg by mouth 2 (two) times daily.  07/06/15  Yes Charlynne Cousins, MD  rosuvastatin (CRESTOR) 10 MG tablet take 1 tablet by mouth once daily Patient taking differently: Take 10 mg by mouth at bedtime.  01/12/14  Yes Janith Lima, MD  ibuprofen (ADVIL,MOTRIN) 600 MG tablet Take 1 tablet (600 mg total) by mouth every 8 (eight) hours as needed (migraine headaches). Patient not taking:  Reported on 12/30/2015 10/31/15   Louellen Molder, MD    Family History Family History  Problem Relation Age of Onset  . Drug abuse Mother   . Alcohol abuse Mother   . Alcohol abuse Father   . Colon cancer Paternal Uncle   . Diabetes Brother   . Alcohol abuse Brother     x 2    Social History Social History  Substance Use Topics  . Smoking status: Never Smoker  . Smokeless tobacco: Never Used  . Alcohol use No     Allergies   Codeine; Macrolides and ketolides; Procaine hcl; Dilaudid [hydromorphone hcl]; and Sulfa antibiotics   Review of Systems Review of Systems  Respiratory: Negative for shortness of breath.   Cardiovascular: Negative for chest pain.  Gastrointestinal: Negative for abdominal pain.  Neurological: Negative for headaches.  Psychiatric/Behavioral: Positive for depression and suicidal ideas. The patient is nervous/anxious.   All other systems reviewed and are negative.    Physical Exam Updated Vital Signs BP 121/77 (BP Location: Right Arm)   Pulse 90   Temp 98.3 F (36.8 C)   Resp 11   Wt 170 lb (77.1 kg)   SpO2 96%   BMI 32.65 kg/m   Physical Exam  Constitutional: She appears well-developed and well-nourished.  HENT:  Head: Normocephalic and atraumatic.  Eyes: Conjunctivae and EOM are normal.  Neck: Normal range of motion.  Cardiovascular: Regular rhythm.   Tachycardia present.  Exam reveals no friction rub.   No murmur heard. Pulmonary/Chest: No stridor. No respiratory distress.  Abdominal: Soft. Bowel sounds are normal. She exhibits no distension. There is no tenderness.  Musculoskeletal: She exhibits no edema or deformity.  Neurological: She is alert. No cranial nerve deficit. Coordination normal.  Skin: Skin is warm and dry.  Nursing note and vitals reviewed.    ED Treatments / Results  Labs (all labs ordered are listed, but only abnormal results are displayed) Labs Reviewed  COMPREHENSIVE METABOLIC PANEL - Abnormal; Notable for the following:       Result Value   CO2 18 (*)    Glucose, Bld 245 (*)    All other components within normal limits  ACETAMINOPHEN LEVEL - Abnormal; Notable for the following:    Acetaminophen (Tylenol), Serum <10 (*)    All other components within normal limits  CBC - Abnormal; Notable for the following:    WBC 12.0 (*)    All other components within normal limits  RAPID URINE DRUG SCREEN, HOSP PERFORMED - Abnormal; Notable for the following:    Benzodiazepines POSITIVE (*)    All other components within normal limits  URINALYSIS, ROUTINE W REFLEX MICROSCOPIC (NOT AT [facility]) - Abnormal; Notable for the following:    APPearance CLOUDY (*)    Glucose, UA 250 (*)    Hgb urine dipstick LARGE (*)    Protein, ur 30 (*)    Leukocytes, UA LARGE (*)    All other components within normal limits  URINE MICROSCOPIC-ADD ON - Abnormal; Notable for the following:    Squamous Epithelial / LPF 0-5 (*)    Bacteria, UA MANY (*)    All other components within normal limits  BASIC METABOLIC PANEL - Abnormal; Notable for the following:    Chloride 114 (*)    CO2 19 (*)    Glucose, Bld 142 (*)    Calcium 8.5 (*)    All other components within normal limits  ETHANOL  SALICYLATE LEVEL    EKG  EKG Interpretation  Date/Time:  Monday December 30 2015 17:44:15 EDT Ventricular Rate:  111 PR Interval:    QRS  Duration: 88 QT Interval:  319 QTC Calculation: 436 R Axis:   71 Text Interpretation:  Sinus tachycardia Low voltage, precordial leads Baseline wander in lead(s) I aVR aVL Confirmed by [facility] MD, Corene Cornea [phone]) on 12/30/2015 5:47:47 PM       Radiology No results found.  Procedures Procedures (including critical care time)  Medications Ordered in ED Medications  acetaminophen (TYLENOL) tablet 1,000 mg (not administered)  carbamazepine (TEGRETOL XR) 12 hr tablet 400 mg (not administered)  cholecalciferol (VITAMIN D) tablet 2,000 Units (not administered)  gabapentin (NEURONTIN) capsule 100 mg (100 mg Oral Given 12/30/15 2257)  glipiZIDE (GLUCOTROL XL) 24 hr tablet 10 mg (not administered)  L-methylfolate Calcium TABS 15 mg (not administered)  levothyroxine (SYNTHROID, LEVOTHROID) tablet 50 mcg (not administered)  liothyronine (CYTOMEL) tablet 25 mcg (not administered)  lisinopril (PRINIVIL,ZESTRIL) tablet 10 mg (not administered)  lithium tablet 600 mg (not administered)  LORazepam (ATIVAN) tablet 1 mg (1 mg Oral Given 12/30/15 2257)  memantine (NAMENDA) tablet 5 mg (not administered)  ondansetron (ZOFRAN) tablet 4 mg (not administered)  QUEtiapine (SEROQUEL) tablet 200-400 mg (not administered)  rOPINIRole (REQUIP) tablet 1.5 mg (not administered)  rosuvastatin (CRESTOR) tablet 10 mg (not administered)  sodium chloride 0.9 % bolus 1,000 mL (0 mLs Intravenous Stopped 12/30/15 1958)  LORazepam (ATIVAN) tablet 1 mg (1 mg Oral Given 12/30/15 1744)  ondansetron (ZOFRAN) injection 4 mg (4 mg Intravenous Given 12/30/15 1839)  cefTRIAXone (ROCEPHIN) 1 g in dextrose 5 % 50 mL IVPB (0 g Intravenous Stopped 12/30/15 1955)  ibuprofen (ADVIL,MOTRIN) tablet 600 mg (600 mg Oral Given 12/30/15 1849)  sodium chloride 0.9 % bolus 1,000 mL (0 mLs Intravenous Stopped 12/30/15 2204)     Initial Impression / Assessment and Plan / ED Course  I have reviewed the triage vital signs and the nursing  notes.  Pertinent labs & imaging results that were available during my care of the patient were reviewed by me and considered in my medical decision making (see chart for details).  Clinical Course   Depression/SI, needs to be evaluated. However slightly elevated HR, possible UTI so needs medical clearance first.  Will check UA, give bolus/ativan and reassess.  patietn relaxed, HR improved, glucose improved, bicarb improved. Rocephin given, keflex ordered. Likely related to dehydration 2/2 uti and decreased intake and can continue to hydrate on her own.  Medically cleared for TTS consultation.   Final Clinical Impressions(s) / ED Diagnoses   Final diagnoses:  Suicidal ideation  Suicidal thoughts  Acute cystitis without hematuria  Dehydration    New Prescriptions New Prescriptions   No medications on file     Merrily Pew, MD 12/30/15 [phone]
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
## 2015-12-30 NOTE — ED Notes (Signed)
Patient c/o bladder and right hip pain and requesting something for pain.   Made Dr Dolly Rias aware, given verbal orders for Ibuprofen 600mg .

## 2015-12-30 NOTE — ED Triage Notes (Signed)
States psychiatrist called over to talk to [name] [name] about meeting her over here and doing an assessment. States is SI with a plan to hurt herself by pills. States as been very depressed for about the last cuople weeks. States depression has been building due to multiple health issues and bipolar disorder. Husband said one thing that really triggered the depression was finding out about problems with her teeth- states the diabetic medication she was taking was contradicting some of the psychiatric medications and causing tooth erosion and stated she thought she was going to have to have about 6 teeth pulled. States she has been having a lot of pain for which she has been seeing a PT for her neck, shoulders, and lower back.

## 2015-12-30 NOTE — ED Notes (Signed)
Bed: [facility] Expected date:  Expected time:  Means of arrival:  Comments: 

## 2015-12-30 NOTE — ED Triage Notes (Signed)
States thinks she may have a bladder infection. Reports she has been having urinary urgency the past couple days and this morning states she has developed pain.

## 2015-12-30 NOTE — ED Notes (Signed)
Bed: [facility]4 Expected date:  Expected time:  Means of arrival:  Comments: [place]

## 2015-12-30 NOTE — ED Notes (Signed)
Pt did not want dinner tray

## 2015-12-30 NOTE — ED Notes (Signed)
Pt ambulatory to restroom

## 2015-12-31 ENCOUNTER — Emergency Department (HOSPITAL_COMMUNITY): Payer: Medicare Other

## 2015-12-31 DIAGNOSIS — Z833 Family history of diabetes mellitus: Secondary | ICD-10-CM | POA: Diagnosis not present

## 2015-12-31 DIAGNOSIS — Z813 Family history of other psychoactive substance abuse and dependence: Secondary | ICD-10-CM | POA: Diagnosis not present

## 2015-12-31 DIAGNOSIS — Z811 Family history of alcohol abuse and dependence: Secondary | ICD-10-CM | POA: Diagnosis not present

## 2015-12-31 DIAGNOSIS — R05 Cough: Secondary | ICD-10-CM | POA: Diagnosis not present

## 2015-12-31 DIAGNOSIS — N3000 Acute cystitis without hematuria: Secondary | ICD-10-CM | POA: Diagnosis not present

## 2015-12-31 DIAGNOSIS — R45851 Suicidal ideations: Secondary | ICD-10-CM

## 2015-12-31 DIAGNOSIS — Z888 Allergy status to other drugs, medicaments and biological substances status: Secondary | ICD-10-CM

## 2015-12-31 DIAGNOSIS — F332 Major depressive disorder, recurrent severe without psychotic features: Secondary | ICD-10-CM | POA: Diagnosis not present

## 2015-12-31 LAB — TSH: TSH: 0.36 u[IU]/mL (ref 0.350–4.500)

## 2015-12-31 MED ORDER — HYDROXYZINE HCL 25 MG PO TABS
50.0000 mg | ORAL_TABLET | Freq: Three times a day (TID) | ORAL | Status: DC | PRN
  Administered 2015-12-31 (×2): 50 mg via ORAL
  Filled 2015-12-31 (×2): qty 2

## 2015-12-31 MED ORDER — LITHIUM CARBONATE 300 MG PO CAPS
600.0000 mg | ORAL_CAPSULE | Freq: Every day | ORAL | Status: DC
  Administered 2015-12-31: 600 mg via ORAL
  Filled 2015-12-31: qty 2

## 2015-12-31 MED ORDER — LORAZEPAM 0.5 MG PO TABS: 0.5000 mg | ORAL_TABLET | Freq: Three times a day (TID) | ORAL | Status: DC

## 2015-12-31 MED ORDER — QUETIAPINE FUMARATE 100 MG PO TABS: 200.0000 mg | ORAL_TABLET | Freq: Two times a day (BID) | ORAL | Status: DC

## 2015-12-31 MED ORDER — BIOTENE DRY MOUTH MT LIQD
15.0000 mL | Freq: Three times a day (TID) | OROMUCOSAL | Status: DC
  Administered 2015-12-31: 15 mL via OROMUCOSAL

## 2015-12-31 NOTE — ED Notes (Signed)
Patient upset because she takes 2 mg or Requip and 200 mg of Seroquel in the morning, but only 1.5 mg Requip ordered.  Patient wants 2 mg or Requip.

## 2015-12-31 NOTE — Progress Notes (Signed)
Patient ID: [name] [name], female   DOB: 02/14/1946, 70 y.o.   MRN: [id number]   Accepted to [facility] per Dr. Launa Grill.  Call report to [phone].

## 2015-12-31 NOTE — ED Notes (Signed)
Patient relocated to room 28.  Breakfast tray given.  Patient ambulatory.

## 2015-12-31 NOTE — ED Notes (Signed)
Bed: [facility]09 Expected date:  Expected time:  Means of arrival:  Comments: Room 4 

## 2015-12-31 NOTE — ED Notes (Signed)
Patient's husband took her cell phone, wallet and clothing home with her.  The only thing that remains is hearing aids and batteries.

## 2015-12-31 NOTE — ED Notes (Signed)
Bed: WA28 Expected date:  Expected time:  Means of arrival:  Comments: 
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
## 2015-12-31 NOTE — BH Assessment (Signed)
[place] Assessment Progress Note  Per Corena Pilgrim, MD, this pt requires psychiatric hospitalization at this time.  The following facilities have been contacted to seek placement for this pt, with results as noted:  Beds available, information sent, decision pending:  [place]:  [facility], [place] Triage Specialist [phone]
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
## 2015-12-31 NOTE — ED Notes (Signed)
Patient sleeping comfortably.  ?

## 2016-01-01 DIAGNOSIS — F3181 Bipolar II disorder: Secondary | ICD-10-CM | POA: Diagnosis not present

## 2016-01-02 DIAGNOSIS — E119 Type 2 diabetes mellitus without complications: Secondary | ICD-10-CM | POA: Diagnosis not present

## 2016-01-02 DIAGNOSIS — E559 Vitamin D deficiency, unspecified: Secondary | ICD-10-CM | POA: Diagnosis not present

## 2016-01-02 DIAGNOSIS — F3181 Bipolar II disorder: Secondary | ICD-10-CM | POA: Diagnosis not present

## 2016-01-02 DIAGNOSIS — I10 Essential (primary) hypertension: Secondary | ICD-10-CM | POA: Diagnosis not present

## 2016-01-03 DIAGNOSIS — E559 Vitamin D deficiency, unspecified: Secondary | ICD-10-CM | POA: Diagnosis not present

## 2016-01-03 DIAGNOSIS — I10 Essential (primary) hypertension: Secondary | ICD-10-CM | POA: Diagnosis not present

## 2016-01-03 DIAGNOSIS — F3181 Bipolar II disorder: Secondary | ICD-10-CM | POA: Diagnosis not present

## 2016-01-03 DIAGNOSIS — E119 Type 2 diabetes mellitus without complications: Secondary | ICD-10-CM | POA: Diagnosis not present

## 2016-01-04 DIAGNOSIS — I10 Essential (primary) hypertension: Secondary | ICD-10-CM | POA: Diagnosis not present

## 2016-01-04 DIAGNOSIS — F3181 Bipolar II disorder: Secondary | ICD-10-CM | POA: Diagnosis not present

## 2016-01-04 DIAGNOSIS — E559 Vitamin D deficiency, unspecified: Secondary | ICD-10-CM | POA: Diagnosis not present

## 2016-01-04 DIAGNOSIS — E119 Type 2 diabetes mellitus without complications: Secondary | ICD-10-CM | POA: Diagnosis not present

## 2016-01-05 DIAGNOSIS — F3181 Bipolar II disorder: Secondary | ICD-10-CM | POA: Diagnosis not present

## 2016-01-06 DIAGNOSIS — F3181 Bipolar II disorder: Secondary | ICD-10-CM | POA: Diagnosis not present

## 2016-01-16 DIAGNOSIS — E785 Hyperlipidemia, unspecified: Secondary | ICD-10-CM | POA: Diagnosis not present

## 2016-01-16 DIAGNOSIS — E039 Hypothyroidism, unspecified: Secondary | ICD-10-CM | POA: Diagnosis not present

## 2016-01-16 DIAGNOSIS — I10 Essential (primary) hypertension: Secondary | ICD-10-CM | POA: Diagnosis not present

## 2016-01-16 DIAGNOSIS — E119 Type 2 diabetes mellitus without complications: Secondary | ICD-10-CM | POA: Diagnosis not present

## 2016-01-16 DIAGNOSIS — R05 Cough: Secondary | ICD-10-CM | POA: Diagnosis not present

## 2016-01-16 DIAGNOSIS — Z7984 Long term (current) use of oral hypoglycemic drugs: Secondary | ICD-10-CM | POA: Diagnosis not present

## 2016-01-16 DIAGNOSIS — F319 Bipolar disorder, unspecified: Secondary | ICD-10-CM | POA: Diagnosis not present

## 2016-02-17 DIAGNOSIS — Z7984 Long term (current) use of oral hypoglycemic drugs: Secondary | ICD-10-CM | POA: Diagnosis not present

## 2016-02-17 DIAGNOSIS — F319 Bipolar disorder, unspecified: Secondary | ICD-10-CM | POA: Diagnosis not present

## 2016-02-17 DIAGNOSIS — E119 Type 2 diabetes mellitus without complications: Secondary | ICD-10-CM | POA: Diagnosis not present

## 2016-02-17 DIAGNOSIS — E785 Hyperlipidemia, unspecified: Secondary | ICD-10-CM | POA: Diagnosis not present

## 2016-02-17 DIAGNOSIS — I10 Essential (primary) hypertension: Secondary | ICD-10-CM | POA: Diagnosis not present

## 2016-02-17 DIAGNOSIS — E039 Hypothyroidism, unspecified: Secondary | ICD-10-CM | POA: Diagnosis not present

## 2016-03-11 DIAGNOSIS — N390 Urinary tract infection, site not specified: Secondary | ICD-10-CM | POA: Diagnosis not present

## 2016-03-11 DIAGNOSIS — E7800 Pure hypercholesterolemia, unspecified: Secondary | ICD-10-CM | POA: Diagnosis not present

## 2016-03-11 DIAGNOSIS — B373 Candidiasis of vulva and vagina: Secondary | ICD-10-CM | POA: Diagnosis not present

## 2016-03-11 DIAGNOSIS — E119 Type 2 diabetes mellitus without complications: Secondary | ICD-10-CM | POA: Diagnosis not present

## 2016-03-18 DIAGNOSIS — R1084 Generalized abdominal pain: Secondary | ICD-10-CM | POA: Diagnosis not present

## 2016-03-18 DIAGNOSIS — E119 Type 2 diabetes mellitus without complications: Secondary | ICD-10-CM | POA: Diagnosis not present

## 2016-03-18 DIAGNOSIS — R350 Frequency of micturition: Secondary | ICD-10-CM | POA: Diagnosis not present

## 2016-03-20 DIAGNOSIS — E119 Type 2 diabetes mellitus without complications: Secondary | ICD-10-CM | POA: Diagnosis not present

## 2016-03-20 DIAGNOSIS — R1084 Generalized abdominal pain: Secondary | ICD-10-CM | POA: Diagnosis not present

## 2016-03-21 ENCOUNTER — Emergency Department (HOSPITAL_COMMUNITY)
Admission: EM | Admit: 2016-03-21 | Discharge: 2016-03-21 | Disposition: A | Discharge status: Home / Self Care | Payer: Medicare Other | Attending: Emergency Medicine | Admitting: Emergency Medicine

## 2016-03-21 ENCOUNTER — Encounter (HOSPITAL_COMMUNITY): Payer: Self-pay | Admitting: Emergency Medicine

## 2016-03-21 DIAGNOSIS — E039 Hypothyroidism, unspecified: Secondary | ICD-10-CM | POA: Diagnosis not present

## 2016-03-21 DIAGNOSIS — I10 Essential (primary) hypertension: Secondary | ICD-10-CM | POA: Insufficient documentation

## 2016-03-21 DIAGNOSIS — E860 Dehydration: Secondary | ICD-10-CM | POA: Diagnosis not present

## 2016-03-21 DIAGNOSIS — Z79899 Other long term (current) drug therapy: Secondary | ICD-10-CM | POA: Diagnosis not present

## 2016-03-21 DIAGNOSIS — R739 Hyperglycemia, unspecified: Secondary | ICD-10-CM

## 2016-03-21 DIAGNOSIS — Z96641 Presence of right artificial hip joint: Secondary | ICD-10-CM | POA: Insufficient documentation

## 2016-03-21 DIAGNOSIS — E1143 Type 2 diabetes mellitus with diabetic autonomic (poly)neuropathy: Secondary | ICD-10-CM | POA: Insufficient documentation

## 2016-03-21 DIAGNOSIS — E1165 Type 2 diabetes mellitus with hyperglycemia: Secondary | ICD-10-CM | POA: Insufficient documentation

## 2016-03-21 DIAGNOSIS — Z794 Long term (current) use of insulin: Secondary | ICD-10-CM | POA: Diagnosis not present

## 2016-03-21 DIAGNOSIS — E119 Type 2 diabetes mellitus without complications: Secondary | ICD-10-CM | POA: Diagnosis not present

## 2016-03-21 LAB — URINALYSIS, ROUTINE W REFLEX MICROSCOPIC
Bacteria, UA: NONE SEEN
Bilirubin Urine: NEGATIVE
Glucose, UA: >=500 mg/dL — AB
Hgb urine dipstick: NEGATIVE
Ketones, ur: 5 mg/dL — AB
Leukocytes, UA: NEGATIVE
Nitrite: NEGATIVE
Protein, ur: NEGATIVE mg/dL
Specific Gravity, Urine: 1.013 (ref 1.005–1.030)
pH: 6 (ref 5.0–8.0)

## 2016-03-21 LAB — BASIC METABOLIC PANEL
Anion gap: 11 (ref 5–15)
BUN: 15 mg/dL (ref 6–20)
CO2: 19 mmol/L — ABNORMAL LOW (ref 22–32)
Calcium: 9.6 mg/dL (ref 8.9–10.3)
Chloride: 100 mmol/L — ABNORMAL LOW (ref 101–111)
Creatinine, Ser: 1.11 mg/dL — ABNORMAL HIGH (ref 0.44–1.00)
GFR calc Af Amer: 58 mL/min — ABNORMAL LOW (ref >60)
GFR calc non Af Amer: 50 mL/min — ABNORMAL LOW (ref >60)
Glucose, Bld: 407 mg/dL — ABNORMAL HIGH (ref 65–99)
Potassium: 4.6 mmol/L (ref 3.5–5.1)
Sodium: 130 mmol/L — ABNORMAL LOW (ref 135–145)

## 2016-03-21 LAB — CBC
HCT: 43.4 % (ref 36.0–46.0)
Hemoglobin: 14.3 g/dL (ref 12.0–15.0)
MCH: 29.9 pg (ref 26.0–34.0)
MCHC: 32.9 g/dL (ref 30.0–36.0)
MCV: 90.8 fL (ref 78.0–100.0)
Platelets: 176 10*3/uL (ref 150–400)
RBC: 4.78 MIL/uL (ref 3.87–5.11)
RDW: 13 % (ref 11.5–15.5)
WBC: 7.3 10*3/uL (ref 4.0–10.5)

## 2016-03-21 LAB — CBG MONITORING, ED
Glucose-Capillary: 409 mg/dL — ABNORMAL HIGH (ref 65–99)
Glucose-Capillary: 350 mg/dL — ABNORMAL HIGH (ref 65–99)
Glucose-Capillary: 268 mg/dL — ABNORMAL HIGH (ref 65–99)

## 2016-03-21 MED ORDER — SODIUM CHLORIDE 0.9 % IV BOLUS (SEPSIS)
2000.0000 mL | Freq: Once | INTRAVENOUS | Status: AC
Start: 2016-03-21 — End: 2016-03-21
  Administered 2016-03-21: 2000 mL via INTRAVENOUS

## 2016-03-21 NOTE — ED Triage Notes (Signed)
Pt c/o hyperglycemia, shakiness, fatigue x several months. Unsure of onset of hyperglycemia due to not regularly checking sugars. Pt changed from glipizide to 10 Units Lantus 1 week ago without much help.

## 2016-03-21 NOTE — ED Provider Notes (Signed)
[facility] DEPT Provider Note   CSN: [id number] Arrival date & time: 03/21/16  1217     History   Chief Complaint Chief Complaint  Patient presents with  . Hyperglycemia    HPI [name] [name] is a 71 y.o. female.  HPI  71 year old female with a history of type 2 diabetes presents with hyperglycemia from her PCPs office. She has been having "high sugars" for the past several weeks to months. She has been feeling weak and fatigued during this time. Seems to be worse over the last 1 week. She also had dysuria in addition to urinary frequency and was diagnosed with a UTI and yeast infection. These have cleared since being put on antibiotics that she has are to finish. However she still weak and fatigued with high sugars. Most recent was in the 400s at home. She was switched to Lantus off of glipizide one week ago. She'll  check her glucose infrequently and when she feels bad. Does not take it daily. There are no focal symptoms except generalized weakness and tiredness. She does have polyuria and polydipsia which is chronic over several months. Is currently being referred to an endocrinologist for a hemoglobin A1C of 9 and hypercalcemia.  Past Medical History:  Diagnosis Date  . Alcohol abuse   . Anxiety    takes Valium daily as needed and Ativan daily  . Bilateral hearing loss   . Bipolar 1 disorder ([place])    takes Lithium nightly and Synthroid daily  . Chronic back pain    DDD  . Colitis, ischemic ([place]) 2012  . Confusion    r/t meds  . Depression    takes Prozac daily and Bupspirone   . Diabetes ([name])   . Diabetes mellitus without complication ([place])    takes Janumet daily  . Diverticulosis   . Diverticulosis   . Dyslipidemia    takes Crestor daily  . Fibromyalgia   . Hepatic steatosis 06/18/12   severe  . History of bronchitis    last time at least 49yrs ago  . Hyperlipidemia   . Hypothyroidism   . IBS (irritable bowel syndrome)   . Ischemic colitis ([place])   . Joint  pain   . Joint swelling   . Lupus    tumid-skin  . Nephrolithiasis   . Numbness    in right foot  . Osteoarthritis    in hips  . Osteoarthritis cervical spine   . Osteoarthritis of hand    bilateral  . Restless leg syndrome    takes Requip nightly  . Sciatica   . Urinary frequency   . Urinary leakage   . Urinary urgency   . Urinary, incontinence, stress female   . Walking pneumonia    last time more than 71yrs ago    Patient Active Problem List   Diagnosis Date Noted  . Major depressive disorder, recurrent severe without psychotic features ([place]) 12/31/2015  . Nausea without vomiting   . Colitis 10/28/2015  . Unintentional poisoning by psychotropic drug 07/05/2015  . Restless leg syndrome 07/05/2015  . Diabetic polyneuropathy associated with type 2 diabetes mellitus ([place]) 09/21/2014  . Arthritis of left hip 06/09/2013  . Arthritis of right hip 06/08/2013  . Hip pain, right 05/02/2013  . IBS (irritable bowel syndrome) 01/02/2013  . Unspecified vitamin D deficiency 07/05/2012  . Pure hyperglyceridemia 07/04/2012  . Diabetes mellitus with diabetic neuropathy, without long-term current use of insulin ([place]) 06/18/2012  . Hypertension 06/18/2012  . Bipolar 1 disorder ([place])  06/18/2012  . Low back pain 02/25/2011  . Acute ischemic colitis ([place]) 01/06/2011  . Sciatica 12/19/2010  . Fibromyalgia 10/28/2010  . Primary osteoarthritis of right hip 10/24/2010  . Hyperlipidemia LDL goal < 100 05/05/2010  . DEPRESSION/ANXIETY 05/05/2010  . ABUSE, ALCOHOL, IN REMISSION 05/05/2010  . OTITIS MEDIA, CHRONIC 05/05/2010  . HEARING LOSS, BILATERAL 05/05/2010  . ALLERGIC RHINITIS, SEASONAL, MILD 05/05/2010  . URINARY INCONTINENCE, STRESS, FEMALE 05/05/2010  . OSTEOARTHRITIS, HANDS, BILATERAL 05/05/2010  . OSTEOARTHRITIS, CERVICAL SPINE 05/05/2010    Past Surgical History:  Procedure Laterality Date  . ABDOMINAL HYSTERECTOMY    . APPENDECTOMY    . COLONOSCOPY    . D&C x 4    .  DENTAL SURGERY Left 2 weeks ago    dental implant  . ESOPHAGOGASTRODUODENOSCOPY    . excision of tumors at right neck     angle of jaw '68, benign  . FLEXIBLE SIGMOIDOSCOPY N/A 06/21/2012   Procedure: FLEXIBLE SIGMOIDOSCOPY;  Surgeon: Jerene Bears, MD;  [facility];  Service: Gastroenterology;  Laterality: N/A;  . TONSILLECTOMY    . TOTAL HIP ARTHROPLASTY Right 06/09/2013   Procedure: TOTAL HIP ARTHROPLASTY;  Surgeon: Kerin Salen, MD;  Location: [facility];  Service: Orthopedics;  Laterality: Right;  . TUBAL LIGATION      OB History    No data available       Home Medications    Prior to Admission medications   Medication Sig Start Date End Date Taking? Authorizing Provider  atorvastatin (LIPITOR) 40 MG tablet Take 40 mg by mouth daily. 02/26/16  Yes Historical Provider, MD  cholecalciferol (VITAMIN D) 1000 units tablet Take 2,000 Units by mouth daily.    Yes Historical Provider, MD  donepezil (ARICEPT) 5 MG tablet Take 5 mg by mouth at bedtime. 02/25/16  Yes Historical Provider, MD  gabapentin (NEURONTIN) 100 MG capsule Take 1 capsule (100 mg total) by mouth 3 (three) times daily. Patient taking differently: Take 300 mg by mouth 2 (two) times daily.  07/06/15  Yes Charlynne Cousins, MD  LANTUS SOLOSTAR 100 UNIT/ML Solostar Pen Inject 10 Units into the skin daily. 03/11/16  Yes Historical Provider, MD  levothyroxine (SYNTHROID, LEVOTHROID) 50 MCG tablet Take 50 mcg by mouth daily before breakfast.   Yes Historical Provider, MD  liothyronine (CYTOMEL) 25 MCG tablet Take 25 mcg by mouth daily.   Yes Historical Provider, MD  lithium 300 MG tablet Take 600 mg by mouth every evening.    Yes Historical Provider, MD  LORazepam (ATIVAN) 2 MG tablet Take 2 mg by mouth 4 (four) times daily. 03/20/16  Yes Historical Provider, MD  losartan (COZAAR) 50 MG tablet Take 50 mg by mouth daily. 03/12/16  Yes Historical Provider, MD  memantine (NAMENDA) 5 MG tablet Take 5 mg by mouth every evening.    Yes Historical Provider, MD  naltrexone (DEPADE) 50 MG tablet Take 50 mg by mouth daily. 02/25/16  Yes Historical Provider, MD  QUEtiapine (SEROQUEL) 300 MG tablet Take 200-400 mg by mouth 2 (two) times daily. Takes 300mg  at 1700 and 300mg  at bedtime 06/10/15  Yes Historical Provider, MD  rOPINIRole (REQUIP) 0.5 MG tablet Take 3 tablets (1.5 mg total) by mouth daily. Patient taking differently: Take 2 mg by mouth 2 (two) times daily.  07/06/15  Yes Charlynne Cousins, MD  ibuprofen (ADVIL,MOTRIN) 600 MG tablet Take 1 tablet (600 mg total) by mouth every 8 (eight) hours as needed (migraine headaches). Patient not taking: Reported  on 12/30/2015 10/31/15   Nishant Dhungel, MD  rosuvastatin (CRESTOR) 10 MG tablet take 1 tablet by mouth once daily Patient not taking: Reported on 03/21/2016 01/12/14   Janith Lima, MD    Family History Family History  Problem Relation Age of Onset  . Drug abuse Mother   . Alcohol abuse Mother   . Alcohol abuse Father   . Colon cancer Paternal Uncle   . Diabetes Brother   . Alcohol abuse Brother     x 2    Social History Social History  Substance Use Topics  . Smoking status: Never Smoker  . Smokeless tobacco: Never Used  . Alcohol use No     Allergies   Codeine; Macrolides and ketolides; Procaine hcl; Aspirin; Dilaudid [hydromorphone hcl]; and Sulfa antibiotics   Review of Systems Review of Systems  Constitutional: Positive for fatigue. Negative for fever.  Respiratory: Negative for shortness of breath.   Cardiovascular: Negative for chest pain.  Gastrointestinal: Negative for abdominal pain, nausea and vomiting.  Endocrine: Positive for polyphagia and polyuria.  Genitourinary: Negative for dysuria.  Neurological: Positive for weakness and light-headedness. Negative for headaches.  All other systems reviewed and are negative.    Physical Exam Updated Vital Signs BP 130/74 (BP Location: Left Arm)   Pulse 88   Temp 98 F (36.7 C) (Oral)    Resp 16   SpO2 94%   Physical Exam  Constitutional: She is oriented to person, place, and time. She appears well-developed and well-nourished.  HENT:  Head: Normocephalic and atraumatic.  Right Ear: External ear normal.  Left Ear: External ear normal.  Nose: Nose normal.  Eyes: EOM are normal. Right eye exhibits no discharge. Left eye exhibits no discharge.  Neck: Neck supple.  Cardiovascular: Normal rate, regular rhythm and normal heart sounds.   Pulmonary/Chest: Effort normal and breath sounds normal.  Abdominal: Soft. There is no tenderness.  Neurological: She is alert and oriented to person, place, and time.  CN 3-12 grossly intact. 5/5 strength in all 4 extremities. Grossly normal sensation. Normal finger to nose.   Skin: Skin is warm and dry.  Nursing note and vitals reviewed.    ED Treatments / Results  Labs (all labs ordered are listed, but only abnormal results are displayed) Labs Reviewed  BASIC METABOLIC PANEL - Abnormal; Notable for the following:       Result Value   Sodium 130 (*)    Chloride 100 (*)    CO2 19 (*)    Glucose, Bld 407 (*)    Creatinine, Ser 1.11 (*)    GFR calc non Af Amer 50 (*)    GFR calc Af Amer 58 (*)    All other components within normal limits  URINALYSIS, ROUTINE W REFLEX MICROSCOPIC - Abnormal; Notable for the following:    Color, Urine STRAW (*)    Glucose, UA >=500 (*)    Ketones, ur 5 (*)    Squamous Epithelial / LPF 0-5 (*)    All other components within normal limits  CBG MONITORING, ED - Abnormal; Notable for the following:    Glucose-Capillary 409 (*)    All other components within normal limits  CBG MONITORING, ED - Abnormal; Notable for the following:    Glucose-Capillary 350 (*)    All other components within normal limits  CBG MONITORING, ED - Abnormal; Notable for the following:    Glucose-Capillary 268 (*)    All other components within normal limits  CBC  EKG  EKG Interpretation  Date/Time:  Saturday  March 21 2016 13:28:00 EST Ventricular Rate:  93 PR Interval:    QRS Duration: 86 QT Interval:  346 QTC Calculation: 431 R Axis:   40 Text Interpretation:  Sinus rhythm Probable left atrial enlargement Low voltage, precordial leads Anteroseptal infarct, old no significant change since Oct 2017 Confirmed by Regenia Skeeter MD, Darien Mignogna [phone]) on 03/21/2016 1:39:35 PM       Radiology No results found.  Procedures Procedures (including critical care time)  Medications Ordered in ED Medications  sodium chloride 0.9 % bolus 2,000 mL (2,000 mLs Intravenous New Bag/Given 03/21/16 1320)     Initial Impression / Assessment and Plan / ED Course  I have reviewed the triage vital signs and the nursing notes.  Pertinent labs & imaging results that were available during my care of the patient were reviewed by me and considered in my medical decision making (see chart for details).  Clinical Course as of Mar 21 1606  Sat Mar 21, 2016  1310 Generalized weakness likely from hyperglycemia. Benign neuro exam. No infectious symptoms. Mild tachycardia on arrival. Will check labs to r/o DKA, give fluids.  [SG]    Clinical Course User Index [SG] Sherwood Gambler, MD    Work up shows hyperglycemia but no evidence of DKA. I believe her generalized weakness is from chronic hyperglycemia and dehydration. Mild bump in creatinine from baseline but not c/w renal failure. No signs or symptoms of infection. Bicarb is 19 but this appears chronic and has a normal anion gap. Discussed diet changes, increased fluids and better glucose monitoring. F/u with PCP. Discussed return precautions.  Final Clinical Impressions(s) / ED Diagnoses   Final diagnoses:  Hyperglycemia  Dehydration    New Prescriptions New Prescriptions   No medications on file     Sherwood Gambler, MD 03/21/16 [phone]

## 2016-03-21 NOTE — Discharge Instructions (Signed)
Be sure to drink plenty of fluids. Check your glucose (sugar) daily and note it to show your doctor. Return if your symptoms worsen or do not improve

## 2016-03-28 DIAGNOSIS — N390 Urinary tract infection, site not specified: Secondary | ICD-10-CM | POA: Diagnosis not present

## 2016-03-28 DIAGNOSIS — B373 Candidiasis of vulva and vagina: Secondary | ICD-10-CM | POA: Diagnosis not present

## 2016-03-31 DIAGNOSIS — E1165 Type 2 diabetes mellitus with hyperglycemia: Secondary | ICD-10-CM | POA: Diagnosis not present

## 2016-03-31 DIAGNOSIS — M79651 Pain in right thigh: Secondary | ICD-10-CM | POA: Diagnosis not present

## 2016-03-31 DIAGNOSIS — Z794 Long term (current) use of insulin: Secondary | ICD-10-CM | POA: Diagnosis not present

## 2016-04-02 ENCOUNTER — Other Ambulatory Visit (HOSPITAL_COMMUNITY): Payer: Self-pay | Admitting: Orthopedic Surgery

## 2016-04-02 DIAGNOSIS — M25551 Pain in right hip: Secondary | ICD-10-CM | POA: Diagnosis not present

## 2016-04-03 ENCOUNTER — Other Ambulatory Visit (HOSPITAL_COMMUNITY): Payer: Self-pay | Admitting: Orthopedic Surgery

## 2016-04-03 DIAGNOSIS — M79604 Pain in right leg: Secondary | ICD-10-CM | POA: Diagnosis not present

## 2016-04-03 DIAGNOSIS — M25551 Pain in right hip: Secondary | ICD-10-CM | POA: Diagnosis not present

## 2016-04-06 ENCOUNTER — Encounter: Payer: Self-pay | Admitting: Endocrinology

## 2016-04-06 ENCOUNTER — Ambulatory Visit (INDEPENDENT_AMBULATORY_CARE_PROVIDER_SITE_OTHER): Payer: Medicare Other | Admitting: Endocrinology

## 2016-04-06 VITALS — BP 134/80 | HR 106 | Ht 61.0 in | Wt 176.0 lb

## 2016-04-06 DIAGNOSIS — E039 Hypothyroidism, unspecified: Secondary | ICD-10-CM

## 2016-04-06 DIAGNOSIS — E1165 Type 2 diabetes mellitus with hyperglycemia: Secondary | ICD-10-CM | POA: Diagnosis not present

## 2016-04-06 NOTE — Patient Instructions (Signed)
Check blood sugars on waking up  Every 2 days  Also check blood sugars about 2 hours after a meal and do this after different meals by rotation  Recommended blood sugar levels on waking up is 90-130 and about 2 hours after meal is 130-160  Please bring your blood sugar monitor to each visit, thank you  Stop Glipizide  Start VICTOZA injection as shown once daily at the same time AS lANTUS   Dial the dose to 0.6 mg on the pen for the first week.  You may inject in the stomach, thigh or arm. You may experience nausea in the first few days which usually goes away.   You will feel fullness of the stomach with starting the medication and should try to keep the portions at meals small.   After 1 week increase the dose to 1.2mg  daily if no nausea present.   If any questions or concerns are present call the office or the [place] helpline at [phone]. Visit [email or web address].[email or web address] for more useful information  LANTS 20 UNITS DAILY

## 2016-04-06 NOTE — Progress Notes (Signed)
Patient ID: [name] [name], female   DOB: 1946-01-02, 71 y.o.   MRN: [id number]            Reason for Appointment: Consultation for Type 2 Diabetes  Referring physician: [facility]   History of Present Illness:          Date of diagnosis of type 2 diabetes mellitus: ?  2014        Background history:   She thinks her blood sugar was 400 at the time of diagnosis but no detailed records of this are available She did have an A1c of 10.6 done in 2014 and was probably given Lantus for some time initially Apparently she was treated with various medications including metformin, Janumet and Tradgenta. Her blood sugars had improved and A1c in 2015 was down to 6.2 She tends to have diarrhea with metformin and Janumet and also she thinks it causes dry mouth Most likely Janumet was stopped in 06/2015 Glipizide was started in 8/17 when blood sugars were higher and A1c was 9%  Recent history:   INSULIN regimen is:   Lantus 15 units daily      Non-insulin hypoglycemic drugs the patient is taking are:  Current management, blood sugar patterns and problems identified:  She was seen by her primary care physician in early January because of recently higher readings for several weeks and started on Lantus insulin 10 units.  She was having increased thirst and her blood sugars were around 400, she was treated with hydration in the ER also  More recently patient is taking 15 units of insulin and her blood sugars are gradually coming down but still over 200 fasting  She is using an unknown brand of glucose monitor and checking readings mostly in the morning  She does still complain of dry mouth and some increased thirst and urination.  She does not have any meal plan and is interested in learning what to eat  She is not able to do any exercise recently        Side effects from medications have been:?  Diarrhea from metformin and Janumet  Compliance with the medical regimen:  Irregular  Glucose monitoring:  done 1 times a day         Glucometer: Unknown        Blood Glucose readings by time of day and averages from home record   PREMEAL Breakfast Lunch Dinner Bedtime  Overall   Glucose range: [phone]    Median:        Self-care: The diet that the patient has been following WE:[id number], tries to limit drinks with sugar .      Typical meal intake: Breakfast is eggs and sausage and lunch is usually a sandwich               Dietician visit, most recent:never               Exercise:  none  Weight history:  Wt Readings from Last 3 Encounters:  04/06/16 176 lb (79.8 kg)  12/30/15 170 lb (77.1 kg)  12/06/15 169 lb (76.7 kg)    Glycemic control:   Lab Results  Component Value Date   HGBA1C 9.0 (H) 10/31/2015   HGBA1C 6.2 (H) 06/09/2013   HGBA1C 6.5 01/23/2013   Lab Results  Component Value Date   MICROALBUR 0.1 05/02/2013   CREATININE 1.11 (H) 03/21/2016   Lab Results  Component Value Date   MICRALBCREAT 0.2 05/02/2013  Allergies as of 04/06/2016      Reactions   Codeine Anxiety, Other (See Comments)   hallucinations   Macrolides And Ketolides Nausea And Vomiting   All mycins   Procaine Hcl Palpitations   Aspirin Nausea And Vomiting   Burns stomach   Dilaudid [hydromorphone Hcl]    Sulfa Antibiotics Other (See Comments)      Medication List       Accurate as of 04/06/16  1:11 PM. Always use your most recent med list.          atorvastatin 40 MG tablet Commonly known as:  LIPITOR Take 40 mg by mouth daily.   cholecalciferol 1000 units tablet Commonly known as:  VITAMIN D Take 2,000 Units by mouth daily.   donepezil 5 MG tablet Commonly known as:  ARICEPT Take 5 mg by mouth at bedtime.   gabapentin 100 MG capsule Commonly known as:  NEURONTIN Take 1 capsule (100 mg total) by mouth 3 (three) times daily.   glipiZIDE 10 MG tablet Commonly known as:  GLUCOTROL Take 10 mg by mouth 2 (two) times daily.     ibuprofen 600 MG tablet Commonly known as:  ADVIL,MOTRIN Take 1 tablet (600 mg total) by mouth every 8 (eight) hours as needed (migraine headaches).   LANTUS SOLOSTAR 100 UNIT/ML Solostar Pen Generic drug:  Insulin Glargine Inject 10 Units into the skin daily.   levothyroxine 50 MCG tablet Commonly known as:  SYNTHROID, LEVOTHROID Take 50 mcg by mouth daily before breakfast.   liothyronine 25 MCG tablet Commonly known as:  CYTOMEL Take 25 mcg by mouth daily.   lithium 300 MG tablet Take 600 mg by mouth every evening.   LORazepam 2 MG tablet Commonly known as:  ATIVAN Take 2 mg by mouth 4 (four) times daily.   losartan 50 MG tablet Commonly known as:  COZAAR Take 50 mg by mouth daily.   memantine 5 MG tablet Commonly known as:  NAMENDA Take 5 mg by mouth every evening.   naltrexone 50 MG tablet Commonly known as:  DEPADE Take 50 mg by mouth daily.   QUEtiapine 300 MG tablet Commonly known as:  SEROQUEL Take 200-400 mg by mouth 2 (two) times daily. Takes '300mg'$  at 1700 and '300mg'$  at bedtime   rOPINIRole 0.5 MG tablet Commonly known as:  REQUIP Take 3 tablets (1.5 mg total) by mouth daily.       Allergies:  Allergies  Allergen Reactions  . Codeine Anxiety and Other (See Comments)    hallucinations  . Macrolides And Ketolides Nausea And Vomiting    All mycins  . Procaine Hcl Palpitations  . Aspirin Nausea And Vomiting    Burns stomach  . Dilaudid [Hydromorphone Hcl]   . Sulfa Antibiotics Other (See Comments)    Past Medical History:  Diagnosis Date  . Alcohol abuse   . Anxiety    takes Valium daily as needed and Ativan daily  . Bilateral hearing loss   . Bipolar 1 disorder ([place])    takes Lithium nightly and Synthroid daily  . Chronic back pain    DDD  . Colitis, ischemic ([place]) 2012  . Confusion    r/t meds  . Depression    takes Prozac daily and Bupspirone   . Diabetes ([place])   . Diabetes mellitus without complication ([place])    takes Janumet  daily  . Diverticulosis   . Diverticulosis   . Dyslipidemia    takes Crestor daily  . Fibromyalgia   .  Hepatic steatosis 06/18/12   severe  . History of bronchitis    last time at least 84yr ago  . Hyperlipidemia   . Hypothyroidism   . IBS (irritable bowel syndrome)   . Ischemic colitis ([place]   . Joint pain   . Joint swelling   . Lupus    tumid-skin  . Nephrolithiasis   . Numbness    in right foot  . Osteoarthritis    in hips  . Osteoarthritis cervical spine   . Osteoarthritis of hand    bilateral  . Restless leg syndrome    takes Requip nightly  . Sciatica   . Urinary frequency   . Urinary leakage   . Urinary urgency   . Urinary, incontinence, stress female   . Walking pneumonia    last time more than 135yrago    Past Surgical History:  Procedure Laterality Date  . ABDOMINAL HYSTERECTOMY    . APPENDECTOMY    . COLONOSCOPY    . D&C x 4    . DENTAL SURGERY Left 2 weeks ago    dental implant  . ESOPHAGOGASTRODUODENOSCOPY    . excision of tumors at right neck     angle of jaw '68, benign  . FLEXIBLE SIGMOIDOSCOPY N/A 06/21/2012   Procedure: FLEXIBLE SIGMOIDOSCOPY;  Surgeon: JaJerene BearsMD;  Location: [facility];  Service: Gastroenterology;  Laterality: N/A;  . TONSILLECTOMY    . TOTAL HIP ARTHROPLASTY Right 06/09/2013   Procedure: TOTAL HIP ARTHROPLASTY;  Surgeon: FrKerin SalenMD;  [facility] Service: Orthopedics;  Laterality: Right;  . TUBAL LIGATION      Family History  Problem Relation Age of Onset  . Drug abuse Mother   . Alcohol abuse Mother   . Alcohol abuse Father   . Colon cancer Paternal Uncle   . Diabetes Brother   . Alcohol abuse Brother     x 2    Social History:  reports that she has never smoked. She has never used smokeless tobacco. She reports that she does not drink alcohol or use drugs.   Review of Systems  Constitutional: Positive for weight gain.  HENT:       Chronic dry mouth  Eyes: Negative for blurred vision.   Respiratory: Negative for shortness of breath.   Cardiovascular: Negative for chest pain and leg swelling.  Gastrointestinal: Negative for diarrhea.  Endocrine: Positive for menstrual changes and polydipsia.       She thinks she has been taking Synthroid and Cytomel for several years from psychiatrist who started this along with her lithium, not clear if she has had regular thyroid levels done  Genitourinary: Positive for frequency.       UTI  Musculoskeletal:       She takes gabapentin for restless leg syndrome  Skin: Negative for rash.  Neurological: Positive for numbness and tingling.       Occasionally may feel numb in her toes  Psychiatric/Behavioral: Positive for depressed mood and nervousness.      Lipid history: On Lipitor 40 mg daily by her PCP Last cholesterol 233 with triglycerides 627    Lab Results  Component Value Date   CHOL 140 01/23/2013   HDL 51.60 01/23/2013   LDLDIRECT 70.6 01/23/2013   TRIG 255.0 (H) 01/23/2013   CHOLHDL 3 01/23/2013           Hypertension:None  Most recent eye exam was 2017  Most recent foot exam:  Lab Results  Component  Value Date   TSH 0.360 12/31/2015   TSH 1.352 07/05/2015   TSH 2.318 06/18/2012     LABS:  No visits with results within 1 Week(s) from this visit.  Latest known visit with results is:  Admission on 03/21/2016, Discharged on 03/21/2016  Component Date Value Ref Range Status  . Sodium 03/21/2016 130* 135 - 145 mmol/L Final  . Potassium 03/21/2016 4.6  3.5 - 5.1 mmol/L Final  . Chloride 03/21/2016 100* 101 - 111 mmol/L Final  . CO2 03/21/2016 19* 22 - 32 mmol/L Final  . Glucose, Bld 03/21/2016 407* 65 - 99 mg/dL Final  . BUN 03/21/2016 15  6 - 20 mg/dL Final  . Creatinine, Ser 03/21/2016 1.11* 0.44 - 1.00 mg/dL Final  . Calcium 03/21/2016 9.6  8.9 - 10.3 mg/dL Final  . GFR calc non Af Amer 03/21/2016 50* >60 mL/min Final  . GFR calc Af Amer 03/21/2016 58* >60 mL/min Final   Comment: (NOTE) The eGFR has  been calculated using the CKD EPI equation. This calculation has not been validated in all clinical situations. eGFR's persistently <60 mL/min signify possible Chronic Kidney Disease.   . Anion gap 03/21/2016 11  5 - 15 Final  . WBC 03/21/2016 7.3  4.0 - 10.5 K/uL Final  . RBC 03/21/2016 4.78  3.87 - 5.11 MIL/uL Final  . Hemoglobin 03/21/2016 14.3  12.0 - 15.0 g/dL Final  . HCT 03/21/2016 43.4  36.0 - 46.0 % Final  . MCV 03/21/2016 90.8  78.0 - 100.0 fL Final  . MCH 03/21/2016 29.9  26.0 - 34.0 pg Final  . MCHC 03/21/2016 32.9  30.0 - 36.0 g/dL Final  . RDW 03/21/2016 13.0  11.5 - 15.5 % Final  . Platelets 03/21/2016 176  150 - 400 K/uL Final  . Color, Urine 03/21/2016 STRAW* YELLOW Final  . APPearance 03/21/2016 CLEAR  CLEAR Final  . Specific Gravity, Urine 03/21/2016 1.013  1.005 - 1.030 Final  . pH 03/21/2016 6.0  5.0 - 8.0 Final  . Glucose, UA 03/21/2016 >=500* NEGATIVE mg/dL Final  . Hgb urine dipstick 03/21/2016 NEGATIVE  NEGATIVE Final  . Bilirubin Urine 03/21/2016 NEGATIVE  NEGATIVE Final  . Ketones, ur 03/21/2016 5* NEGATIVE mg/dL Final  . Protein, ur 03/21/2016 NEGATIVE  NEGATIVE mg/dL Final  . Nitrite 03/21/2016 NEGATIVE  NEGATIVE Final  . Leukocytes, UA 03/21/2016 NEGATIVE  NEGATIVE Final  . RBC / HPF 03/21/2016 0-5  0 - 5 RBC/hpf Final  . WBC, UA 03/21/2016 0-5  0 - 5 WBC/hpf Final  . Bacteria, UA 03/21/2016 NONE SEEN  NONE SEEN Final  . Squamous Epithelial / LPF 03/21/2016 0-5* NONE SEEN Final  . Glucose-Capillary 03/21/2016 409* 65 - 99 mg/dL Final  . Glucose-Capillary 03/21/2016 350* 65 - 99 mg/dL Final  . Glucose-Capillary 03/21/2016 268* 65 - 99 mg/dL Final    Physical Examination:  BP 134/80   Pulse (!) 106   Ht '5\' 1"'$  (1.549 m)   Wt 176 lb (79.8 kg)   SpO2 95%   BMI 33.25 kg/m   GENERAL:         Patient has generalized obesity.   HEENT:         Eye exam shows normal external appearance. Fundus exam shows no retinopathy. Oral exam shows normal mucosa .    NECK:   There is no lymphadenopathy Thyroid is not enlarged and no nodules felt.  Carotids are normal to palpation and no bruit heard LUNGS:  Chest is symmetrical. Lungs are clear to auscultation.Marland Kitchen   HEART:         Heart sounds:  S1 and S2 are normal. No murmur or click heard., no S3 or S4.   ABDOMEN:   There is no distention present. Liver and spleen are not palpable. No other mass or tenderness present.   NEUROLOGICAL:   Ankle jerks are absent bilaterally.    Diabetic Foot Exam - Simple   Simple Foot Form Diabetic Foot exam was performed with the following findings:  Yes   Visual Inspection No deformities, no ulcerations, no other skin breakdown bilaterally:  Yes Sensation Testing Intact to touch and monofilament testing bilaterally:  Yes Pulse Check Posterior Tibialis and Dorsalis pulse intact bilaterally:  Yes Comments            Vibration sense is Somewhat reduced in distal first toes, patient not able to cooperate well for the assessment. MUSCULOSKELETAL:  There is no swelling or deformity of the peripheral joints. Spine is normal to inspection.   EXTREMITIES:     There is no edema. No skin lesions present.Marland Kitchen SKIN:       No rash or lesions of concern.        ASSESSMENT:  Diabetes type 2, uncontrolled     Patient has had poor control since last year probably because of stopping her Janumet which may have been keeping her sugars well controlled for a couple of years. Also previously has had significant hyperglycemia at the time of diagnosis Since she has had symptomatic hyperglycemia recently she is starting on insulin this month and blood sugars are improving However with her taking only glipizide as an additional drug this is likely to be ineffective  Patient declines taking her Janumet again Also has difficulty losing weight, current BMI 33  She is a good candidate for a GLP-1 drug in addition to current insulin regimen  Complications of diabetes:  Mononeuropathy  ?  Hypothyroidism: She is on a combination of Cytomel 25 and Synthroid low doses reported from psychiatrist and not clear how this is being followed or how often  Other chronic medical problems: Depression, memory loss, restless legs joint pains  History of HYPERLIPIDEMIA: Recently had high triglycerides but likely because of poor diabetes control and will need follow-up  PLAN:    Discussed with the patient the nature of GLP-1 drugs, the actions on various organ systems, how they benefit blood glucose control, as well as the benefit of weight loss and  increase satiety . Explained possible side effects especially nausea and vomiting initially; discussed safety information in package insert.  Described the injection technique and dosage titration of Victoza  starting with 0.6 mg once a day at the same time for the first week and then increasing to 1.2 mg if no symptoms of nausea.  Educational brochure on Victoza  given  She will increase her Lantus to 20 units for now  Stop glipizide  Consultation with dietitian and nurse educator.  She needs to bring her monitor for review on each visit and need to see if she is getting a brand name monitor.  Discussed timing of glucose monitoring at various times instead of just fasting and blood sugar targets  Consider metformin ER on the next visit  Needs short-term follow-up to see if she is having improvement in her sugar control  Needs follow-up thyroid functions including free T4 and free T3    Patient Instructions  Check blood sugars on waking up  Every 2 days  Also check blood sugars about 2 hours after a meal and do this after different meals by rotation  Recommended blood sugar levels on waking up is 90-130 and about 2 hours after meal is 130-160  Please bring your blood sugar monitor to each visit, thank you  Stop Glipizide  Start VICTOZA injection as shown once daily at the same time AS lANTUS   Dial the dose to  0.6 mg on the pen for the first week.  You may inject in the stomach, thigh or arm. You may experience nausea in the first few days which usually goes away.   You will feel fullness of the stomach with starting the medication and should try to keep the portions at meals small.   After 1 week increase the dose to 1.'2mg'$  daily if no nausea present.   If any questions or concerns are present call the office or the [place] helpline at [phone]. Visit [email or web address].wall.info/ for more useful information  LANTS 20 UNITS DAILY      Consultation note has been sent to the referring physician  Counseling time on subjects discussed above is over 50% of today's 60 minute visit   Caige Almeda 04/06/2016, 1:11 PM   Note: This office note was prepared with Estate agent. Any transcriptional errors that result from this process are unintentional.

## 2016-04-07 ENCOUNTER — Telehealth: Payer: Self-pay | Admitting: Endocrinology

## 2016-04-07 MED ORDER — LIRAGLUTIDE 18 MG/3ML ~~LOC~~ SOPN: PEN_INJECTOR | SUBCUTANEOUS | 2 refills | Status: DC

## 2016-04-07 NOTE — Telephone Encounter (Signed)
Patient stated that Dr Dwyane Dee forgot to send her  medication Victoza sent to her pharmacy. Please advise

## 2016-04-07 NOTE — Telephone Encounter (Signed)
Refill for victoza submitted.

## 2016-04-14 ENCOUNTER — Encounter (HOSPITAL_COMMUNITY)
Admission: RE | Admit: 2016-04-14 | Discharge: 2016-04-14 | Disposition: A | Discharge status: Home / Self Care | Payer: Medicare Other | Source: Ambulatory Visit | Attending: Orthopedic Surgery | Admitting: Orthopedic Surgery

## 2016-04-14 ENCOUNTER — Encounter: Payer: Self-pay | Admitting: Nutrition

## 2016-04-14 DIAGNOSIS — M25551 Pain in right hip: Secondary | ICD-10-CM | POA: Diagnosis not present

## 2016-04-14 MED ORDER — TECHNETIUM TC 99M MEDRONATE IV KIT
21.4000 | PACK | Freq: Once | INTRAVENOUS | Status: AC | PRN
  Administered 2016-04-14: 21.4 via INTRAVENOUS

## 2016-04-15 ENCOUNTER — Encounter: Payer: Self-pay | Admitting: Nutrition

## 2016-04-21 DIAGNOSIS — Z96641 Presence of right artificial hip joint: Secondary | ICD-10-CM | POA: Diagnosis not present

## 2016-04-21 DIAGNOSIS — Z09 Encounter for follow-up examination after completed treatment for conditions other than malignant neoplasm: Secondary | ICD-10-CM | POA: Diagnosis not present

## 2016-04-26 ENCOUNTER — Emergency Department (HOSPITAL_COMMUNITY)
Admission: EM | Admit: 2016-04-26 | Discharge: 2016-04-26 | Disposition: A | Discharge status: Home / Self Care | Payer: Medicare Other | Attending: Emergency Medicine | Admitting: Emergency Medicine

## 2016-04-26 ENCOUNTER — Emergency Department (HOSPITAL_COMMUNITY): Payer: Medicare Other

## 2016-04-26 ENCOUNTER — Encounter (HOSPITAL_COMMUNITY): Payer: Self-pay | Admitting: Emergency Medicine

## 2016-04-26 DIAGNOSIS — Z794 Long term (current) use of insulin: Secondary | ICD-10-CM | POA: Diagnosis not present

## 2016-04-26 DIAGNOSIS — N132 Hydronephrosis with renal and ureteral calculous obstruction: Secondary | ICD-10-CM | POA: Diagnosis not present

## 2016-04-26 DIAGNOSIS — N390 Urinary tract infection, site not specified: Secondary | ICD-10-CM

## 2016-04-26 DIAGNOSIS — Z79899 Other long term (current) drug therapy: Secondary | ICD-10-CM | POA: Insufficient documentation

## 2016-04-26 DIAGNOSIS — N131 Hydronephrosis with ureteral stricture, not elsewhere classified: Secondary | ICD-10-CM | POA: Insufficient documentation

## 2016-04-26 DIAGNOSIS — R1031 Right lower quadrant pain: Secondary | ICD-10-CM | POA: Diagnosis present

## 2016-04-26 DIAGNOSIS — R109 Unspecified abdominal pain: Secondary | ICD-10-CM | POA: Diagnosis not present

## 2016-04-26 DIAGNOSIS — Z96641 Presence of right artificial hip joint: Secondary | ICD-10-CM | POA: Insufficient documentation

## 2016-04-26 DIAGNOSIS — E1142 Type 2 diabetes mellitus with diabetic polyneuropathy: Secondary | ICD-10-CM | POA: Diagnosis not present

## 2016-04-26 DIAGNOSIS — N319 Neuromuscular dysfunction of bladder, unspecified: Secondary | ICD-10-CM | POA: Insufficient documentation

## 2016-04-26 DIAGNOSIS — Q6210 Congenital occlusion of ureter, unspecified: Secondary | ICD-10-CM

## 2016-04-26 DIAGNOSIS — R8299 Other abnormal findings in urine: Secondary | ICD-10-CM | POA: Diagnosis not present

## 2016-04-26 DIAGNOSIS — E119 Type 2 diabetes mellitus without complications: Secondary | ICD-10-CM | POA: Diagnosis not present

## 2016-04-26 DIAGNOSIS — R319 Hematuria, unspecified: Secondary | ICD-10-CM | POA: Insufficient documentation

## 2016-04-26 DIAGNOSIS — R3129 Other microscopic hematuria: Secondary | ICD-10-CM | POA: Diagnosis not present

## 2016-04-26 DIAGNOSIS — E039 Hypothyroidism, unspecified: Secondary | ICD-10-CM | POA: Insufficient documentation

## 2016-04-26 LAB — COMPREHENSIVE METABOLIC PANEL
ALT: 19 U/L (ref 14–54)
AST: 19 U/L (ref 15–41)
Albumin: 3.9 g/dL (ref 3.5–5.0)
Alkaline Phosphatase: 89 U/L (ref 38–126)
Anion gap: 6 (ref 5–15)
BUN: 8 mg/dL (ref 6–20)
CO2: 24 mmol/L (ref 22–32)
Calcium: 9.9 mg/dL (ref 8.9–10.3)
Chloride: 108 mmol/L (ref 101–111)
Creatinine, Ser: 0.81 mg/dL (ref 0.44–1.00)
GFR calc Af Amer: >60 mL/min (ref >60)
GFR calc non Af Amer: >60 mL/min (ref >60)
Glucose, Bld: 145 mg/dL — ABNORMAL HIGH (ref 65–99)
Potassium: 4.3 mmol/L (ref 3.5–5.1)
Sodium: 138 mmol/L (ref 135–145)
Total Bilirubin: 1 mg/dL (ref 0.3–1.2)
Total Protein: 7.1 g/dL (ref 6.5–8.1)

## 2016-04-26 LAB — URINALYSIS, ROUTINE W REFLEX MICROSCOPIC
Bilirubin Urine: NEGATIVE
Glucose, UA: NEGATIVE mg/dL
Ketones, ur: NEGATIVE mg/dL
Nitrite: NEGATIVE
Protein, ur: 100 mg/dL — AB
Specific Gravity, Urine: 1.009 (ref 1.005–1.030)
pH: 6 (ref 5.0–8.0)

## 2016-04-26 LAB — CBC WITH DIFFERENTIAL/PLATELET
Basophils Absolute: 0 10*3/uL (ref 0.0–0.1)
Basophils Relative: 0 %
Eosinophils Absolute: 0.3 10*3/uL (ref 0.0–0.7)
Eosinophils Relative: 3 %
HCT: 46.4 % — ABNORMAL HIGH (ref 36.0–46.0)
Hemoglobin: 15.2 g/dL — ABNORMAL HIGH (ref 12.0–15.0)
Lymphocytes Relative: 16 %
Lymphs Abs: 2.1 10*3/uL (ref 0.7–4.0)
MCH: 30 pg (ref 26.0–34.0)
MCHC: 32.8 g/dL (ref 30.0–36.0)
MCV: 91.5 fL (ref 78.0–100.0)
Monocytes Absolute: 0.6 10*3/uL (ref 0.1–1.0)
Monocytes Relative: 5 %
Neutro Abs: 10 10*3/uL — ABNORMAL HIGH (ref 1.7–7.7)
Neutrophils Relative %: 76 %
Platelets: 196 10*3/uL (ref 150–400)
RBC: 5.07 MIL/uL (ref 3.87–5.11)
RDW: 13 % (ref 11.5–15.5)
WBC: 13 10*3/uL — ABNORMAL HIGH (ref 4.0–10.5)

## 2016-04-26 LAB — CBG MONITORING, ED: Glucose-Capillary: 115 mg/dL — ABNORMAL HIGH (ref 65–99)

## 2016-04-26 MED ORDER — DEXTROSE 5 % IV SOLN
1.0000 g | Freq: Once | INTRAVENOUS | Status: AC
  Administered 2016-04-26: 1 g via INTRAVENOUS
  Filled 2016-04-26: qty 10

## 2016-04-26 MED ORDER — SODIUM CHLORIDE 0.9 % IV BOLUS (SEPSIS)
1000.0000 mL | Freq: Once | INTRAVENOUS | Status: AC
  Administered 2016-04-26: 1000 mL via INTRAVENOUS

## 2016-04-26 MED ORDER — ONDANSETRON HCL 4 MG/2ML IJ SOLN
4.0000 mg | Freq: Once | INTRAMUSCULAR | Status: AC
  Administered 2016-04-26: 4 mg via INTRAVENOUS
  Filled 2016-04-26: qty 2

## 2016-04-26 MED ORDER — CEPHALEXIN 500 MG PO CAPS: 500.0000 mg | ORAL_CAPSULE | Freq: Three times a day (TID) | ORAL | 0 refills | Status: DC

## 2016-04-26 MED ORDER — MORPHINE SULFATE (PF) 4 MG/ML IV SOLN
4.0000 mg | Freq: Once | INTRAVENOUS | Status: AC
Start: 2016-04-26 — End: 2016-04-26
  Administered 2016-04-26: 4 mg via INTRAVENOUS
  Filled 2016-04-26: qty 1

## 2016-04-26 MED ORDER — PHENAZOPYRIDINE HCL 200 MG PO TABS: 200.0000 mg | ORAL_TABLET | Freq: Three times a day (TID) | ORAL | 0 refills | Status: DC | PRN

## 2016-04-26 MED ORDER — KETOROLAC TROMETHAMINE 15 MG/ML IJ SOLN
15.0000 mg | Freq: Once | INTRAMUSCULAR | Status: AC
  Administered 2016-04-26: 15 mg via INTRAVENOUS
  Filled 2016-04-26: qty 1

## 2016-04-26 MED ORDER — MORPHINE SULFATE (PF) 4 MG/ML IV SOLN
6.0000 mg | Freq: Once | INTRAVENOUS | Status: AC
  Administered 2016-04-26: 6 mg via INTRAVENOUS
  Filled 2016-04-26: qty 2

## 2016-04-26 NOTE — ED Notes (Signed)
Received bedside report from [facility]

## 2016-04-26 NOTE — Discharge Instructions (Signed)
Read the information below.  Use the prescribed medication as directed.  Please discuss all new medications with your pharmacist.  You may return to the Emergency Department at any time for worsening condition or any new symptoms that concern you.   If you develop high fevers, worsening abdominal pain, uncontrolled vomiting, or are unable to tolerate fluids by mouth, return to the ER for a recheck.  ° °

## 2016-04-26 NOTE — ED Provider Notes (Signed)
[facility] DEPT Provider Note   CSN: [id number] Arrival date & time: 04/26/16  1209     History   Chief Complaint Chief Complaint  Patient presents with  . Hematuria  . Flank Pain    HPI [name] [name] is a 71 y.o. female.  HPI   Pt with hx lupus, diabetes, fibromyalgias, with recurrent UTIs over the past 4 months with right flank pain, lower abdominal cramping, dysuria x 1 day.   States she has had a UTI once a month, improves with antibiotics and then symptoms return.  Is having difficulty holding her urine and leaks as she runs to the bathroom.  Today developed more severe dysuria and was seen at Urgent Care this morning and found to have hematuria.  They did not have imaging available until next week, recommended coming to ED to r/o kidney stone.  Lower abdominal pain is cramping.  Denies fevers, chills, myalgias, N/V.  No change in chronic diarrhea/constipation.  Has prior abdominal surgery of partial hysterectomy due to bleeding.    Past Medical History:  Diagnosis Date  . Alcohol abuse   . Anxiety    takes Valium daily as needed and Ativan daily  . Bilateral hearing loss   . Bipolar 1 disorder ([place])    takes Lithium nightly and Synthroid daily  . Chronic back pain    DDD  . Colitis, ischemic ([place]) 2012  . Confusion    r/t meds  . Depression    takes Prozac daily and Bupspirone   . Diabetes ([place])   . Diabetes mellitus without complication ([place])    takes Janumet daily  . Diverticulosis   . Diverticulosis   . Dyslipidemia    takes Crestor daily  . Fibromyalgia   . Hepatic steatosis 06/18/12   severe  . History of bronchitis    last time at least 84yrs ago  . Hyperlipidemia   . Hypothyroidism   . IBS (irritable bowel syndrome)   . Ischemic colitis ([place])   . Joint pain   . Joint swelling   . Lupus    tumid-skin  . Nephrolithiasis   . Numbness    in right foot  . Osteoarthritis    in hips  . Osteoarthritis cervical spine   . Osteoarthritis of hand      bilateral  . Restless leg syndrome    takes Requip nightly  . Sciatica   . Urinary frequency   . Urinary leakage   . Urinary urgency   . Urinary, incontinence, stress female   . Walking pneumonia    last time more than 54yrs ago    Patient Active Problem List   Diagnosis Date Noted  . Major depressive disorder, recurrent severe without psychotic features ([place]) 12/31/2015  . Nausea without vomiting   . Colitis 10/28/2015  . Unintentional poisoning by psychotropic drug 07/05/2015  . Restless leg syndrome 07/05/2015  . Diabetic polyneuropathy associated with type 2 diabetes mellitus ([place]) 09/21/2014  . Arthritis of left hip 06/09/2013  . Arthritis of right hip 06/08/2013  . Hip pain, right 05/02/2013  . IBS (irritable bowel syndrome) 01/02/2013  . Unspecified vitamin D deficiency 07/05/2012  . Pure hyperglyceridemia 07/04/2012  . Diabetes mellitus with diabetic neuropathy, without long-term current use of insulin ([place]) 06/18/2012  . Hypertension 06/18/2012  . Bipolar 1 disorder ([place]) 06/18/2012  . Low back pain 02/25/2011  . Acute ischemic colitis ([place]) 01/06/2011  . Sciatica 12/19/2010  . Fibromyalgia 10/28/2010  . Primary osteoarthritis of right hip  10/24/2010  . Hyperlipidemia LDL goal < 100 05/05/2010  . DEPRESSION/ANXIETY 05/05/2010  . ABUSE, ALCOHOL, IN REMISSION 05/05/2010  . OTITIS MEDIA, CHRONIC 05/05/2010  . HEARING LOSS, BILATERAL 05/05/2010  . ALLERGIC RHINITIS, SEASONAL, MILD 05/05/2010  . URINARY INCONTINENCE, STRESS, FEMALE 05/05/2010  . OSTEOARTHRITIS, HANDS, BILATERAL 05/05/2010  . OSTEOARTHRITIS, CERVICAL SPINE 05/05/2010    Past Surgical History:  Procedure Laterality Date  . ABDOMINAL HYSTERECTOMY    . APPENDECTOMY    . COLONOSCOPY    . D&C x 4    . DENTAL SURGERY Left 2 weeks ago    dental implant  . ESOPHAGOGASTRODUODENOSCOPY    . excision of tumors at right neck     angle of jaw '68, benign  . FLEXIBLE SIGMOIDOSCOPY N/A 06/21/2012    Procedure: FLEXIBLE SIGMOIDOSCOPY;  Surgeon: Jerene Bears, MD;  Location: [facility];  Service: Gastroenterology;  Laterality: N/A;  . TONSILLECTOMY    . TOTAL HIP ARTHROPLASTY Right 06/09/2013   Procedure: TOTAL HIP ARTHROPLASTY;  Surgeon: Kerin Salen, MD;  Location: [facility];  Service: Orthopedics;  Laterality: Right;  . TUBAL LIGATION      OB History    No data available       Home Medications    Prior to Admission medications   Medication Sig Start Date End Date Taking? Authorizing Provider  atorvastatin (LIPITOR) 40 MG tablet Take 40 mg by mouth daily. 02/26/16   Historical Provider, MD  cephALEXin (KEFLEX) 500 MG capsule Take 1 capsule (500 mg total) by mouth 3 (three) times daily. 04/26/16   Clayton Bibles, PA-C  cholecalciferol (VITAMIN D) 1000 units tablet Take 2,000 Units by mouth daily.     Historical Provider, MD  donepezil (ARICEPT) 5 MG tablet Take 5 mg by mouth at bedtime. 02/25/16   Historical Provider, MD  gabapentin (NEURONTIN) 100 MG capsule Take 1 capsule (100 mg total) by mouth 3 (three) times daily. Patient taking differently: Take 300 mg by mouth 2 (two) times daily.  07/06/15   Charlynne Cousins, MD  glipiZIDE (GLUCOTROL) 10 MG tablet Take 10 mg by mouth 2 (two) times daily. 03/28/16   Historical Provider, MD  ibuprofen (ADVIL,MOTRIN) 600 MG tablet Take 1 tablet (600 mg total) by mouth every 8 (eight) hours as needed (migraine headaches). 10/31/15   Nishant Dhungel, MD  LANTUS SOLOSTAR 100 UNIT/ML Solostar Pen Inject 10 Units into the skin daily. 03/11/16   Historical Provider, MD  levothyroxine (SYNTHROID, LEVOTHROID) 50 MCG tablet Take 50 mcg by mouth daily before breakfast.    Historical Provider, MD  liothyronine (CYTOMEL) 25 MCG tablet Take 25 mcg by mouth daily.    Historical Provider, MD  liraglutide (VICTOZA) 18 MG/3ML SOPN Inject 1.2mg  daily. 04/07/16   Elayne Snare, MD  lithium 300 MG tablet Take 600 mg by mouth every evening.     Historical Provider, MD  LORazepam  (ATIVAN) 2 MG tablet Take 2 mg by mouth 4 (four) times daily. 03/20/16   Historical Provider, MD  losartan (COZAAR) 50 MG tablet Take 50 mg by mouth daily. 03/12/16   Historical Provider, MD  memantine (NAMENDA) 5 MG tablet Take 5 mg by mouth every evening.    Historical Provider, MD  naltrexone (DEPADE) 50 MG tablet Take 50 mg by mouth daily. 02/25/16   Historical Provider, MD  phenazopyridine (PYRIDIUM) 200 MG tablet Take 1 tablet (200 mg total) by mouth 3 (three) times daily as needed for pain. 04/26/16   Clayton Bibles, PA-C  QUEtiapine (SEROQUEL)  300 MG tablet Take 200-400 mg by mouth 2 (two) times daily. Takes 300mg  at 1700 and 300mg  at bedtime 06/10/15   Historical Provider, MD  rOPINIRole (REQUIP) 0.5 MG tablet Take 3 tablets (1.5 mg total) by mouth daily. Patient taking differently: Take 2 mg by mouth 2 (two) times daily.  07/06/15   Charlynne Cousins, MD    Family History Family History  Problem Relation Age of Onset  . Drug abuse Mother   . Alcohol abuse Mother   . Alcohol abuse Father   . Colon cancer Paternal Uncle   . Diabetes Brother   . Alcohol abuse Brother     x 2    Social History Social History  Substance Use Topics  . Smoking status: Never Smoker  . Smokeless tobacco: Never Used  . Alcohol use No     Allergies   Codeine; Macrolides and ketolides; Procaine hcl; Aspirin; Diflucan [fluconazole]; Dilaudid [hydromorphone hcl]; and Sulfa antibiotics   Review of Systems Review of Systems  All other systems reviewed and are negative.    Physical Exam Updated Vital Signs BP 116/57   Pulse 69   Temp 98.3 F (36.8 C) (Oral)   Resp 18   Ht 5\' 2"  (1.575 m)   Wt 79.4 kg   SpO2 94%   BMI 32.01 kg/m   Physical Exam  Constitutional: She appears well-developed and well-nourished. No distress.  HENT:  Head: Normocephalic and atraumatic.  Neck: Neck supple.  Cardiovascular: Normal rate and regular rhythm.   Pulmonary/Chest: Effort normal and breath sounds normal.  No respiratory distress. She has no wheezes. She has no rales.  Abdominal: Soft. She exhibits no distension. There is tenderness in the right upper quadrant, right lower quadrant and suprapubic area. There is no rebound, no guarding and no CVA tenderness.  Obese   Neurological: She is alert.  Skin: She is not diaphoretic.  Nursing note and vitals reviewed.    ED Treatments / Results  Labs (all labs ordered are listed, but only abnormal results are displayed) Labs Reviewed  URINALYSIS, ROUTINE W REFLEX MICROSCOPIC - Abnormal; Notable for the following:       Result Value   APPearance HAZY (*)    Hgb urine dipstick LARGE (*)    Protein, ur 100 (*)    Leukocytes, UA MODERATE (*)    Bacteria, UA RARE (*)    Squamous Epithelial / LPF 0-5 (*)    All other components within normal limits  COMPREHENSIVE METABOLIC PANEL - Abnormal; Notable for the following:    Glucose, Bld 145 (*)    All other components within normal limits  CBC WITH DIFFERENTIAL/PLATELET - Abnormal; Notable for the following:    WBC 13.0 (*)    Hemoglobin 15.2 (*)    HCT 46.4 (*)    Neutro Abs 10.0 (*)    All other components within normal limits  CBG MONITORING, ED - Abnormal; Notable for the following:    Glucose-Capillary 115 (*)    All other components within normal limits  URINE CULTURE    EKG  EKG Interpretation None       Radiology Ct Renal Stone Study  Result Date: 04/26/2016 CLINICAL DATA:  Right flank pain and hematuria. EXAM: CT ABDOMEN AND PELVIS WITHOUT CONTRAST TECHNIQUE: Multidetector CT imaging of the abdomen and pelvis was performed following the standard protocol without IV contrast. COMPARISON:  10/28/2015 FINDINGS: Lower chest:  No contributory findings. Hepatobiliary: No focal liver abnormality.No evidence of biliary obstruction or stone.  Pancreas: Unremarkable. Spleen: Unremarkable. Adrenals/Urinary Tract: Negative adrenals. Chronic right hydronephrosis. Chronic right UPJ obstruction -  dilated renal pelvis with eccentric insertion of decompressed ureter. No urolithiasis noted. No hydroureter. Unremarkable bladder. Stomach/Bowel: No obstruction or inflammatory changes. Sigmoid diverticulosis. Remote epiploic appendagitis along the sigmoid colon Vascular/Lymphatic: No acute vascular abnormality. No mass or adenopathy. Reproductive:Hysterectomy. Other: No ascites or pneumoperitoneum. Musculoskeletal: Right hip arthroplasty. L3-4 and L4-5 facet arthropathy and disc narrowing with bulge. No acute finding IMPRESSION: 1. No acute finding. 2. Chronic right UPJ obstruction with stable hydronephrosis. Unilateral right renal atrophy is likely related. 3. Colonic diverticulosis. Electronically Signed   By: Monte Fantasia M.D.   On: 04/26/2016 14:22    Procedures Procedures (including critical care time)  Medications Ordered in ED Medications  sodium chloride 0.9 % bolus 1,000 mL (0 mLs Intravenous Stopped 04/26/16 1512)  morphine 4 MG/ML injection 4 mg (4 mg Intravenous Given 04/26/16 1345)  ondansetron (ZOFRAN) injection 4 mg (4 mg Intravenous Given 04/26/16 1345)  cefTRIAXone (ROCEPHIN) 1 g in dextrose 5 % 50 mL IVPB (0 g Intravenous Stopped 04/26/16 1620)  morphine 4 MG/ML injection 6 mg (6 mg Intravenous Given 04/26/16 1555)  ketorolac (TORADOL) 15 MG/ML injection 15 mg (15 mg Intravenous Given 04/26/16 1556)     Initial Impression / Assessment and Plan / ED Course  I have reviewed the triage vital signs and the nursing notes.  Pertinent labs & imaging results that were available during my care of the patient were reviewed by me and considered in my medical decision making (see chart for details).    Afebrile, nontoxic patient with dysuria, right flank pain.  UA appears infected.  Culture sent.  CT demonstrates no stone but chronic stenosis at UPJ with stable hydronephrosis.  Pt was not aware of this.  Renal function normal. Rocephin given in ED.  Pt also seen by Dr Venora Maples.   D/C home  with keflex, pyridium, urology follow up.   Discussed result, findings, treatment, and follow up  with patient.  Pt given return precautions.  Pt verbalizes understanding and agrees with plan.       Final Clinical Impressions(s) / ED Diagnoses   Final diagnoses:  Urinary tract infection with hematuria, site unspecified  Ureteral stenosis    New Prescriptions Discharge Medication List as of 04/26/2016  4:37 PM    START taking these medications   Details  cephALEXin (KEFLEX) 500 MG capsule Take 1 capsule (500 mg total) by mouth 3 (three) times daily., Starting Sun 04/26/2016, Print    phenazopyridine (PYRIDIUM) 200 MG tablet Take 1 tablet (200 mg total) by mouth 3 (three) times daily as needed for pain., Starting Sun 04/26/2016, Print         [place], PA-C 04/26/16 [place], MD 04/28/16 [phone]

## 2016-04-26 NOTE — ED Notes (Signed)
In and Out performed by Learta Codding RN and Sam NT.  Tolerated well.  300 ml, straw yellow.

## 2016-04-26 NOTE — ED Triage Notes (Signed)
Pt reports she came from UC and was diagnosed with UTI but believes she has a kidney stone due to blood in her urine. Right sided flank pain worsening yesterday. Pt adds having a UTI every month for past 4 months.

## 2016-04-27 LAB — URINE CULTURE: Culture: 10000 — AB

## 2016-04-28 DIAGNOSIS — R300 Dysuria: Secondary | ICD-10-CM | POA: Diagnosis not present

## 2016-04-28 DIAGNOSIS — F339 Major depressive disorder, recurrent, unspecified: Secondary | ICD-10-CM | POA: Diagnosis not present

## 2016-04-28 DIAGNOSIS — N3000 Acute cystitis without hematuria: Secondary | ICD-10-CM | POA: Diagnosis not present

## 2016-04-28 DIAGNOSIS — Z79899 Other long term (current) drug therapy: Secondary | ICD-10-CM | POA: Diagnosis not present

## 2016-04-28 DIAGNOSIS — N1330 Unspecified hydronephrosis: Secondary | ICD-10-CM | POA: Diagnosis not present

## 2016-04-30 ENCOUNTER — Encounter: Payer: Medicare Other | Attending: Endocrinology | Admitting: Dietician

## 2016-04-30 ENCOUNTER — Other Ambulatory Visit (INDEPENDENT_AMBULATORY_CARE_PROVIDER_SITE_OTHER): Payer: Medicare Other

## 2016-04-30 ENCOUNTER — Encounter: Payer: Self-pay | Admitting: Dietician

## 2016-04-30 DIAGNOSIS — E039 Hypothyroidism, unspecified: Secondary | ICD-10-CM | POA: Diagnosis not present

## 2016-04-30 DIAGNOSIS — E1165 Type 2 diabetes mellitus with hyperglycemia: Secondary | ICD-10-CM

## 2016-04-30 DIAGNOSIS — E1142 Type 2 diabetes mellitus with diabetic polyneuropathy: Secondary | ICD-10-CM

## 2016-04-30 DIAGNOSIS — Z713 Dietary counseling and surveillance: Secondary | ICD-10-CM | POA: Diagnosis not present

## 2016-04-30 LAB — BASIC METABOLIC PANEL
BUN: 9 mg/dL (ref 6–23)
CO2: 27 mEq/L (ref 19–32)
Calcium: 10.4 mg/dL (ref 8.4–10.5)
Chloride: 102 mEq/L (ref 96–112)
Creatinine, Ser: 0.96 mg/dL (ref 0.40–1.20)
GFR: 61.38 mL/min (ref >60.00)
Glucose, Bld: 173 mg/dL — ABNORMAL HIGH (ref 70–99)
Potassium: 4.8 mEq/L (ref 3.5–5.1)
Sodium: 135 mEq/L (ref 135–145)

## 2016-04-30 LAB — T3, FREE: T3, Free: 2.7 pg/mL (ref 2.3–4.2)

## 2016-04-30 LAB — T4, FREE: Free T4: 0.56 ng/dL — ABNORMAL LOW (ref 0.60–1.60)

## 2016-04-30 LAB — TSH: TSH: 0.26 u[IU]/mL — ABNORMAL LOW (ref 0.35–4.50)

## 2016-04-30 NOTE — Patient Instructions (Signed)
Spread your carbohydrate out throughout the day. Consider being more active. Consider sources of Omega 3 (salmon, walnuts, ground flax seeds, chia seeds)  Plan:  Aim for 2-3 Carb Choices per meal (30-45 grams) +/- 1 either way  Aim for 0-1 Carbs per snack if hungry  Include protein in moderation with your meals and snacks Consider reading food labels for Total Carbohydrate and Fat Grams of foods Consider  increasing your activity level by walking or something else you enjoy for 15 minutes daily as tolerated Consider checking BG at alternate times per day as directed by MD  Consider taking medication as directed by MD

## 2016-05-01 ENCOUNTER — Other Ambulatory Visit: Payer: Self-pay

## 2016-05-01 NOTE — Progress Notes (Signed)
Diabetes Self-Management Education  Visit Type: First/Initial  Appt. Start Time: 1430 Appt. End Time: [id number]  05/01/2016  [name], identified by name and date of birth, is a 71 y.o. female with a diagnosis of Diabetes: Type 2. Other hx includes hyperlipidemia, hypothyroidism, anxiety and depression.  Depression score was 22 today.  Patient is here with her husband.  She is seen by a counselor and psychiatrist.  She missed her Diabetes education appointment with Vaughan Basta.  She is a patient of Dr. Dwyane Dee.  Medications include Victoza, Lantus 20 units q am.  Her glipizide was discontinued.  Patient reports increased weight when put on Seroquel and this weight has not decreased since this was discontinued.    Patient lives with her husband who does the shopping and cooking.  ASSESSMENT  Height 5\' 2"  (1.575 m), weight 175 lb (79.4 kg). Body mass index is 32.01 kg/m.      Diabetes Self-Management Education - 04/30/16 1453      Visit Information   Visit Type First/Initial     Initial Visit   Diabetes Type Type 2   Are you currently following a meal plan? No   Are you taking your medications as prescribed? Yes   Date Diagnosed 2014     Health Coping   How would you rate your overall health? Poor     Psychosocial Assessment   Patient Belief/Attitude about Diabetes Other (comment)  challenge to manage with depression   Self-care barriers Other (comment)  depression, severe   Self-management support Doctor's office;Family   Other persons present Patient;Spouse/SO   Patient Concerns Nutrition/Meal planning;Glycemic Control;Weight Control   Special Needs None   Preferred Learning Style No preference indicated   Learning Readiness Ready   How often do you need to have someone help you when you read instructions, pamphlets, or other written materials from your doctor or pharmacy? 1 - Never   What is the last grade level you completed in school? 1 year college     Pre-Education  Assessment   Patient understands the diabetes disease and treatment process. Needs Instruction   Patient understands incorporating nutritional management into lifestyle. Needs Instruction   Patient undertands incorporating physical activity into lifestyle. Needs Instruction   Patient understands using medications safely. Demonstrates understanding / competency   Patient understands monitoring blood glucose, interpreting and using results Needs Review   Patient understands prevention, detection, and treatment of acute complications. Needs Review   Patient understands prevention, detection, and treatment of chronic complications. Needs Review   Patient understands how to develop strategies to address psychosocial issues. Needs Review   Patient understands how to develop strategies to promote health/change behavior. Needs Review     Complications   Last HgB A1C per patient/outside source 9 %  10/31/15   How often do you check your blood sugar? 1-2 times/day   Fasting Blood glucose range (mg/dL) 130-179;180-200   Postprandial Blood glucose range (mg/dL) >200  200-300   Number of hypoglycemic episodes per month 0   Number of hyperglycemic episodes per week 7   Can you tell when your blood sugar is high? No   What do you do if your blood sugar is high? nothing   Have you had a dilated eye exam in the past 12 months? Yes   Have you had a dental exam in the past 12 months? Yes   Are you checking your feet? No     Dietary Intake   Breakfast eggs, sausage or occasional  grits or orange OR cereal (Strawberry honey bunches of oats) with 2% milk  9-10   Snack (morning) occasional cereal and milk OR orange OR potato chips   Lunch none   Snack (afternoon) Brie or other cheese and crackers  2   Dinner meatloaf, sweet potato casserole OR roast with mashed potatoes and gravy OR hamburger patty and salad OR pintos, greens, cornbread  6   Snack (evening) clondike bar and cookies OR sugar free popsickles    Beverage(s) water, 1 ounce apple juice in water, gatorade (sugar free)     Exercise   Exercise Type ADL's  members of the [employer] but orthopedic problems and lack of motivation.   How many days per week to you exercise? 0   How many minutes per day do you exercise? 0   Total minutes per week of exercise 0     Patient Education   Previous Diabetes Education No   Disease state  Definition of diabetes, type 1 and 2, and the diagnosis of diabetes   Nutrition management  Role of diet in the treatment of diabetes and the relationship between the three main macronutrients and blood glucose level;Food label reading, portion sizes and measuring food.;Meal options for control of blood glucose level and chronic complications.   Physical activity and exercise  Role of exercise on diabetes management, blood pressure control and cardiac health.;Helped patient identify appropriate exercises in relation to his/her diabetes, diabetes complications and other health issue.   Monitoring Purpose and frequency of SMBG.;Identified appropriate SMBG and/or A1C goals.   Acute complications Taught treatment of hypoglycemia - the 15 rule.   Chronic complications Relationship between chronic complications and blood glucose control;Assessed and discussed foot care and prevention of foot problems   Psychosocial adjustment Worked with patient to identify barriers to care and solutions;Role of stress on diabetes     Individualized Goals (developed by patient)   Nutrition General guidelines for healthy choices and portions discussed   Physical Activity Exercise 5-7 days per week;15 minutes per day   Medications take my medication as prescribed   Monitoring  test my blood glucose as discussed   Reducing Risk examine blood glucose patterns;treat hypoglycemia with 15 grams of carbs if blood glucose less than 70mg /dL   Health Coping discuss diabetes with (comment)  MD/RD/husband     Post-Education Assessment   Patient  understands the diabetes disease and treatment process. Demonstrates understanding / competency   Patient understands incorporating nutritional management into lifestyle. Demonstrates understanding / competency   Patient undertands incorporating physical activity into lifestyle. Demonstrates understanding / competency   Patient understands using medications safely. Demonstrates understanding / competency   Patient understands monitoring blood glucose, interpreting and using results Demonstrates understanding / competency   Patient understands prevention, detection, and treatment of acute complications. Demonstrates understanding / competency   Patient understands prevention, detection, and treatment of chronic complications. Demonstrates understanding / competency   Patient understands how to develop strategies to address psychosocial issues. Demonstrates understanding / competency   Patient understands how to develop strategies to promote health/change behavior. Demonstrates understanding / competency     Outcomes   Expected Outcomes Demonstrated interest in learning. Expect positive outcomes   Future DMSE PRN   Program Status Completed      Individualized Plan for Diabetes Self-Management Training:   Learning Objective:  Patient will have a greater understanding of diabetes self-management. Patient education plan is to attend individual and/or group sessions per assessed needs and concerns.  Plan:   Patient Instructions  Spread your carbohydrate out throughout the day. Consider being more active. Consider sources of Omega 3 (salmon, walnuts, ground flax seeds, chia seeds)  Plan:  Aim for 2-3 Carb Choices per meal (30-45 grams) +/- 1 either way  Aim for 0-1 Carbs per snack if hungry  Include protein in moderation with your meals and snacks Consider reading food labels for Total Carbohydrate and Fat Grams of foods Consider  increasing your activity level by walking or something  else you enjoy for 15 minutes daily as tolerated Consider checking BG at alternate times per day as directed by MD  Consider taking medication as directed by MD      Expected Outcomes:  Demonstrated interest in learning. Expect positive outcomes  Education material provided: Living Well with Diabetes, Food label handouts, A1C conversion sheet, Meal plan card, My Plate and Snack sheet  If problems or questions, patient to contact team via:  Phone and Email  Future DSME appointment: PRN

## 2016-05-04 ENCOUNTER — Other Ambulatory Visit: Payer: Self-pay

## 2016-05-04 ENCOUNTER — Ambulatory Visit (INDEPENDENT_AMBULATORY_CARE_PROVIDER_SITE_OTHER): Payer: Medicare Other | Admitting: Endocrinology

## 2016-05-04 ENCOUNTER — Encounter: Payer: Self-pay | Admitting: Endocrinology

## 2016-05-04 VITALS — BP 102/68 | HR 96 | Ht 62.0 in | Wt 176.0 lb

## 2016-05-04 DIAGNOSIS — E1165 Type 2 diabetes mellitus with hyperglycemia: Secondary | ICD-10-CM | POA: Diagnosis not present

## 2016-05-04 DIAGNOSIS — Z794 Long term (current) use of insulin: Secondary | ICD-10-CM

## 2016-05-04 LAB — FRUCTOSAMINE: Fructosamine: 285 umol/L — ABNORMAL HIGH (ref 190–270)

## 2016-05-04 MED ORDER — GLUCOSE BLOOD VI STRP: ORAL_STRIP | 5 refills | Status: DC

## 2016-05-04 MED ORDER — INSULIN LISPRO 100 UNIT/ML (KWIKPEN): PEN_INJECTOR | SUBCUTANEOUS | 1 refills | Status: DC

## 2016-05-04 NOTE — Progress Notes (Signed)
Patient ID: [name] [name], female   DOB: Sep 04, 1945, 71 y.o.   MRN: [id number]            Reason for Appointment: Follow-up for Type 2 Diabetes  Referring physician: [facility]   History of Present Illness:          Date of diagnosis of type 2 diabetes mellitus: ?  2014        Background history:   She thinks her blood sugar was 400 at the time of diagnosis but no detailed records of this are available She did have an A1c of 10.6 done in 2014 and was probably given Lantus for some time initially Apparently she was treated with various medications including metformin, Janumet and Tradgenta. Her blood sugars had improved and A1c in 2015 was down to 6.2 She tends to have diarrhea with metformin and Janumet and also she thinks it causes dry mouth Most likely Janumet was stopped in 06/2015 Glipizide was started in 8/17 when blood sugars were higher and A1c was 9%  Recent history:   INSULIN regimen is:   Lantus 20 units daily      Non-insulin hypoglycemic drugs the patient is taking are: Victoza 1.2 mg daily  Current management, blood sugar patterns and problems identified:  She had been on 15 units of Lantus on her initial visit; this had been started in 1/18 with blood sugars around 400 initially  Lantus was increased up to 20 units and she was also started on Victoza; she refused to go back to taking Janumet and reportedly has had diarrhea with metformin  Although she had some nausea with 1.2 mg Victoza she has been able to tolerate this now  Her blood sugars are relatively better but still averaging over 200  She has only a couple of good readings below 200 in the mornings but they are higher later on  She is not able to do any exercise recently  She has just seen the dietitian last Thursday and is going to start making some changes        Side effects from medications have been:?  Diarrhea from metformin and Janumet  Compliance with the medical regimen:  Irregular  Glucose monitoring:  done 1 times a day         Glucometer: Contour        Blood Glucose readings by time of day and averages from home record  Mean values apply above for all meters except median for One Touch  PRE-MEAL Fasting Lunch Dinner Bedtime Overall  Glucose range: 175-262  213, 248  208-267     Mean/median:   226   214   POST-MEAL PC Breakfast PC Lunch PC Dinner  Glucose range: 163-310     Mean/median:       Self-care: The diet that the patient has been following [id number], tries to limit drinks with sugar .      Typical meal intake: Breakfast is eggs and sausage and lunch is usually a sandwich   Dinner 6 pm             Dietician visit, most recent: 2/18               Exercise:  none  Weight history:  Wt Readings from Last 3 Encounters:  05/04/16 176 lb (79.8 kg)  04/30/16 175 lb (79.4 kg)  04/26/16 175 lb (79.4 kg)    Glycemic control:   Lab Results  Component Value Date   HGBA1C 9.0 (  H) 10/31/2015   HGBA1C 6.2 (H) 06/09/2013   HGBA1C 6.5 01/23/2013   Lab Results  Component Value Date   MICROALBUR 0.1 05/02/2013   CREATININE 0.96 04/30/2016   Lab Results  Component Value Date   MICRALBCREAT 0.2 05/02/2013       Allergies as of 05/04/2016      Reactions   Codeine Anxiety, Other (See Comments)   hallucinations   Macrolides And Ketolides Nausea And Vomiting   All mycins   Procaine Hcl Palpitations   Aspirin Nausea And Vomiting   Burns stomach   Diflucan [fluconazole]    Reactions with other medications   Dilaudid [hydromorphone Hcl]    Morphine And Related    Really bad headaches and nightmares   Sulfa Antibiotics Other (See Comments)      Medication List       Accurate as of 05/04/16 12:31 PM. Always use your most recent med list.          atorvastatin 40 MG tablet Commonly known as:  LIPITOR Take 40 mg by mouth daily.   cephALEXin 500 MG capsule Commonly known as:  KEFLEX Take 1 capsule (500 mg total) by mouth 3  (three) times daily.   cholecalciferol 1000 units tablet Commonly known as:  VITAMIN D Take 2,000 Units by mouth daily.   divalproex 125 MG DR tablet Commonly known as:  DEPAKOTE Take 250 mg by mouth daily. 1000 mg daily   donepezil 5 MG tablet Commonly known as:  ARICEPT Take 10 mg by mouth at bedtime.   gabapentin 100 MG capsule Commonly known as:  NEURONTIN Take 1 capsule (100 mg total) by mouth 3 (three) times daily.   glipiZIDE 10 MG tablet Commonly known as:  GLUCOTROL Take 10 mg by mouth 2 (two) times daily.   glucose blood test strip Commonly known as:  BAYER CONTOUR NEXT TEST Use to test blood sugar 3 times daily Dx code- E11.42   ibuprofen 600 MG tablet Commonly known as:  ADVIL,MOTRIN Take 1 tablet (600 mg total) by mouth every 8 (eight) hours as needed (migraine headaches).   LANTUS SOLOSTAR 100 UNIT/ML Solostar Pen Generic drug:  Insulin Glargine Inject 20 Units into the skin daily.   levothyroxine 50 MCG tablet Commonly known as:  SYNTHROID, LEVOTHROID Take 50 mcg by mouth daily before breakfast.   liothyronine 25 MCG tablet Commonly known as:  CYTOMEL Take 25 mcg by mouth daily.   liraglutide 18 MG/3ML Sopn Commonly known as:  VICTOZA Inject 1.2mg  daily.   lithium 300 MG tablet Take 600 mg by mouth every evening.   LORazepam 2 MG tablet Commonly known as:  ATIVAN Take 2 mg by mouth 4 (four) times daily.   losartan 50 MG tablet Commonly known as:  COZAAR Take 50 mg by mouth daily.   memantine 5 MG tablet Commonly known as:  NAMENDA Take 5 mg by mouth every evening.   naltrexone 50 MG tablet Commonly known as:  DEPADE Take 50 mg by mouth daily.   phenazopyridine 200 MG tablet Commonly known as:  PYRIDIUM Take 1 tablet (200 mg total) by mouth 3 (three) times daily as needed for pain.   QUEtiapine 300 MG tablet Commonly known as:  SEROQUEL Take 200-400 mg by mouth 2 (two) times daily. Takes 300mg  at 1700 and 300mg  at bedtime     rOPINIRole 0.5 MG tablet Commonly known as:  REQUIP Take 3 tablets (1.5 mg total) by mouth daily.       Allergies:  Allergies  Allergen Reactions  . Codeine Anxiety and Other (See Comments)    hallucinations  . Macrolides And Ketolides Nausea And Vomiting    All mycins  . Procaine Hcl Palpitations  . Aspirin Nausea And Vomiting    Burns stomach  . Diflucan [Fluconazole]     Reactions with other medications  . Dilaudid [Hydromorphone Hcl]   . Morphine And Related     Really bad headaches and nightmares  . Sulfa Antibiotics Other (See Comments)    Past Medical History:  Diagnosis Date  . Alcohol abuse   . Anxiety    takes Valium daily as needed and Ativan daily  . Bilateral hearing loss   . Bipolar 1 disorder ([place])    takes Lithium nightly and Synthroid daily  . Chronic back pain    DDD  . Colitis, ischemic ([place]) 2012  . Confusion    r/t meds  . Depression    takes Prozac daily and Bupspirone   . Diabetes ([place])   . Diabetes mellitus without complication ([place])    takes Janumet daily  . Diverticulosis   . Diverticulosis   . Dyslipidemia    takes Crestor daily  . Fibromyalgia   . Hepatic steatosis 06/18/12   severe  . History of bronchitis    last time at least 68yrs ago  . Hyperlipidemia   . Hypothyroidism   . IBS (irritable bowel syndrome)   . Ischemic colitis ([place])   . Joint pain   . Joint swelling   . Lupus    tumid-skin  . Nephrolithiasis   . Numbness    in right foot  . Osteoarthritis    in hips  . Osteoarthritis cervical spine   . Osteoarthritis of hand    bilateral  . Restless leg syndrome    takes Requip nightly  . Sciatica   . Urinary frequency   . Urinary leakage   . Urinary urgency   . Urinary, incontinence, stress female   . Walking pneumonia    last time more than 62yrs ago    Past Surgical History:  Procedure Laterality Date  . ABDOMINAL HYSTERECTOMY    . APPENDECTOMY    . COLONOSCOPY    . D&C x 4    . DENTAL SURGERY Left  2 weeks ago    dental implant  . ESOPHAGOGASTRODUODENOSCOPY    . excision of tumors at right neck     angle of jaw '68, benign  . FLEXIBLE SIGMOIDOSCOPY N/A 06/21/2012   Procedure: FLEXIBLE SIGMOIDOSCOPY;  Surgeon: Jerene Bears, MD;  Location: [facility];  Service: Gastroenterology;  Laterality: N/A;  . TONSILLECTOMY    . TOTAL HIP ARTHROPLASTY Right 06/09/2013   Procedure: TOTAL HIP ARTHROPLASTY;  Surgeon: Kerin Salen, MD;  Location: [facility];  Service: Orthopedics;  Laterality: Right;  . TUBAL LIGATION      Family History  Problem Relation Age of Onset  . Drug abuse Mother   . Alcohol abuse Mother   . Alcohol abuse Father   . Colon cancer Paternal Uncle   . Diabetes Brother   . Alcohol abuse Brother     x 2    Social History:  reports that she has never smoked. She has never used smokeless tobacco. She reports that she does not drink alcohol or use drugs.   Review of Systems  Endocrine: Positive for fatigue.  Psychiatric/Behavioral: Positive for depressed mood.     Lipid history: On Lipitor 40 mg daily by her PCP  Last cholesterol 233 with triglycerides 627    Lab Results  Component Value Date   CHOL 140 01/23/2013   HDL 51.60 01/23/2013   LDLDIRECT 70.6 01/23/2013   TRIG 255.0 (H) 01/23/2013   CHOLHDL 3 01/23/2013           Hypertension:None  Most recent eye exam was 2017  Most recent foot exam:  THYROID: She has been on levothyroxine and Cytomel from her psychiatrist for several years with uncertain diagnosis She had been on 75 g but for the last 6 weeks has been only on 50 g based on blood test done by psychiatrist but no information available She complains of feeling tired and still has some depression Labs as follows:  Lab Results  Component Value Date   TSH 0.26 (L) 04/30/2016   TSH 0.360 12/31/2015   TSH 1.352 07/05/2015   FREET4 0.56 (L) 04/30/2016   Lab Results  Component Value Date   T3FREE 2.7 04/30/2016     LABS:  Lab on 04/30/2016   Component Date Value Ref Range Status  . Sodium 04/30/2016 135  135 - 145 mEq/L Final  . Potassium 04/30/2016 4.8  3.5 - 5.1 mEq/L Final  . Chloride 04/30/2016 102  96 - 112 mEq/L Final  . CO2 04/30/2016 27  19 - 32 mEq/L Final  . Glucose, Bld 04/30/2016 173* 70 - 99 mg/dL Final  . BUN 04/30/2016 9  6 - 23 mg/dL Final  . Creatinine, Ser 04/30/2016 0.96  0.40 - 1.20 mg/dL Final  . Calcium 04/30/2016 10.4  8.4 - 10.5 mg/dL Final  . GFR 04/30/2016 61.38  >60.00 mL/min Final  . TSH 04/30/2016 0.26* 0.35 - 4.50 uIU/mL Final  . Free T4 04/30/2016 0.56* 0.60 - 1.60 ng/dL Final   Comment: Specimens from patients who are undergoing biotin therapy and /or ingesting biotin supplements may contain high levels of biotin.  The higher biotin concentration in these specimens interferes with this Free T4 assay.  Specimens that contain high levels  of biotin may cause false high results for this Free T4 assay.  Please interpret results in light of the total clinical presentation of the patient.    . T3, Free 04/30/2016 2.7  2.3 - 4.2 pg/mL Final    Physical Examination:  BP 102/68   Pulse 96   Ht 5\' 2"  (1.575 m)   Wt 176 lb (79.8 kg)   SpO2 97%   BMI 32.19 kg/m          ASSESSMENT:  Diabetes type 2, uncontrolled     See history of present illness for detailed discussion of current diabetes management, blood sugar patterns and problems identified  She is still significantly hyperglycemic although improving with adding Victoza to her basal insulin Her highest readings are between lunch and supper She is not able to control her appetite consistently Has had some improvement in fasting readings but this was high at 223 today  ?  Hypothyroidism: She is on a combination of Cytomel 25 and 50 g of Synthroid from her psychiatrist She complains of fatigue and her free T4 is low, apparently was on 75 g of Synthroid previously Free T3 is also low normal  Hyperlipidemia: Needs follow-up fasting  levels when blood sugars are better  PLAN:    Today discussed in detail the need for mealtime insulin to cover postprandial spikes, action of mealtime insulin, use of the insulin pen, timing and action of the rapid acting insulin as well as starting dose and  dosage titration to target the two-hour reading of under 180.  She was also shown the V-go pump in detail that she does not think she can manage this.  Discussed the need for taking her insulin pen with her when she is going out to eat also  Will not change her Lantus as yet  Try taking 1.8 mg Victoza  Consider metformin ER on the next visit  Start reading blood sugars after supper periodically also  Discussed blood sugar targets at various times and also to call if blood sugars are low normal  Needs follow-up thyroid functions including free T4 and free T3  Will need to have her stop glipizide, to discuss on the next visit    Patient Instructions  Victoza 1.8mg  daily, if having excessive or persistent nausea go back to 1.2 mg  Check blood sugars on waking up 3x weekly   Also check blood sugars about 2 hours after a meal and do this after different meals by rotation  Recommended blood sugar levels on waking up is 90-130 and about 2 hours after meal is 130-160  Please bring your blood sugar monitor to each visit, thank you  Levothyroxine 50ug, take 1 1/2 tabs  Take HUMALOG just before eating.  6 units before breakfast and lunch and 8 units before supper Continue Lantus 20 units daily If morning sugars are getting below 100 Will reduce this to 18 units      Counseling time on subjects discussed above is over 50% of today's 25 minute visit   Rafeal Skibicki 05/04/2016, 12:31 PM   Note: This office note was prepared with Estate agent. Any transcriptional errors that result from this process are unintentional.

## 2016-05-04 NOTE — Patient Instructions (Addendum)
Victoza 1.8mg  daily, if having excessive or persistent nausea go back to 1.2 mg  Check blood sugars on waking up 3x weekly   Also check blood sugars about 2 hours after a meal and do this after different meals by rotation  Recommended blood sugar levels on waking up is 90-130 and about 2 hours after meal is 130-160  Please bring your blood sugar monitor to each visit, thank you  Levothyroxine 50ug, take 1 1/2 tabs  Take HUMALOG just before eating.  6 units before breakfast and lunch and 8 units before supper Continue Lantus 20 units daily If morning sugars are getting below 100 Will reduce this to 18 units

## 2016-05-25 ENCOUNTER — Other Ambulatory Visit: Payer: Self-pay | Admitting: Endocrinology

## 2016-05-25 DIAGNOSIS — Z794 Long term (current) use of insulin: Principal | ICD-10-CM

## 2016-05-25 DIAGNOSIS — E1165 Type 2 diabetes mellitus with hyperglycemia: Secondary | ICD-10-CM

## 2016-05-26 ENCOUNTER — Other Ambulatory Visit: Payer: Self-pay

## 2016-05-26 ENCOUNTER — Telehealth: Payer: Self-pay | Admitting: Endocrinology

## 2016-05-26 NOTE — Telephone Encounter (Signed)
Pt needs her Needles sent into the [facility] on [facility], she stated she is having to inject 5x daily.

## 2016-05-28 ENCOUNTER — Other Ambulatory Visit (INDEPENDENT_AMBULATORY_CARE_PROVIDER_SITE_OTHER): Payer: Medicare Other

## 2016-05-28 ENCOUNTER — Other Ambulatory Visit: Payer: Self-pay

## 2016-05-28 DIAGNOSIS — E1165 Type 2 diabetes mellitus with hyperglycemia: Secondary | ICD-10-CM

## 2016-05-28 DIAGNOSIS — Z794 Long term (current) use of insulin: Secondary | ICD-10-CM | POA: Diagnosis not present

## 2016-05-28 LAB — COMPREHENSIVE METABOLIC PANEL
ALT: 12 U/L (ref 0–35)
AST: 12 U/L (ref 0–37)
Albumin: 3.9 g/dL (ref 3.5–5.2)
Alkaline Phosphatase: 87 U/L (ref 39–117)
BUN: 13 mg/dL (ref 6–23)
CO2: 26 mEq/L (ref 19–32)
Calcium: 10 mg/dL (ref 8.4–10.5)
Chloride: 106 mEq/L (ref 96–112)
Creatinine, Ser: 1 mg/dL (ref 0.40–1.20)
GFR: 58.54 mL/min — ABNORMAL LOW (ref >60.00)
Glucose, Bld: 227 mg/dL — ABNORMAL HIGH (ref 70–99)
Potassium: 4 mEq/L (ref 3.5–5.1)
Sodium: 140 mEq/L (ref 135–145)
Total Bilirubin: 0.3 mg/dL (ref 0.2–1.2)
Total Protein: 6.2 g/dL (ref 6.0–8.3)

## 2016-05-28 LAB — LIPID PANEL
Cholesterol: 176 mg/dL (ref 0–200)
HDL: 41 mg/dL (ref >39.00)
Total CHOL/HDL Ratio: 4
Triglycerides: 453 mg/dL — ABNORMAL HIGH (ref 0.0–149.0)

## 2016-05-28 LAB — LDL CHOLESTEROL, DIRECT: Direct LDL: 100 mg/dL

## 2016-05-28 LAB — HEMOGLOBIN A1C: Hgb A1c MFr Bld: 8.2 % — ABNORMAL HIGH (ref 4.6–6.5)

## 2016-05-29 ENCOUNTER — Other Ambulatory Visit: Payer: Self-pay

## 2016-05-29 ENCOUNTER — Encounter: Payer: Self-pay | Admitting: Endocrinology

## 2016-05-29 ENCOUNTER — Ambulatory Visit (INDEPENDENT_AMBULATORY_CARE_PROVIDER_SITE_OTHER): Payer: Medicare Other | Admitting: Endocrinology

## 2016-05-29 VITALS — BP 118/68 | HR 85 | Ht 62.0 in | Wt 177.0 lb

## 2016-05-29 DIAGNOSIS — E1165 Type 2 diabetes mellitus with hyperglycemia: Secondary | ICD-10-CM

## 2016-05-29 DIAGNOSIS — E039 Hypothyroidism, unspecified: Secondary | ICD-10-CM | POA: Diagnosis not present

## 2016-05-29 DIAGNOSIS — Z794 Long term (current) use of insulin: Secondary | ICD-10-CM

## 2016-05-29 DIAGNOSIS — E782 Mixed hyperlipidemia: Secondary | ICD-10-CM

## 2016-05-29 MED ORDER — METFORMIN HCL ER 500 MG PO TB24: 1500.0000 mg | ORAL_TABLET | Freq: Every day | ORAL | 3 refills | Status: DC

## 2016-05-29 MED ORDER — BD PEN NEEDLE NANO U/F 32G X 4 MM MISC: 2 refills | Status: DC

## 2016-05-29 MED ORDER — LEVOTHYROXINE SODIUM 75 MCG PO TABS: 75.0000 ug | ORAL_TABLET | Freq: Every day | ORAL | 30 refills | Status: DC

## 2016-05-29 NOTE — Progress Notes (Signed)
Patient ID: [name] [name], female   DOB: 11/01/1945, 71 y.o.   MRN: [id number]            Reason for Appointment: Follow-up for Type 2 Diabetes  Referring physician: [facility]gent [facility]   History of Present Illness:          Date of diagnosis of type 2 diabetes mellitus: ?  2014        Background history:   She thinks her blood sugar was 400 at the time of diagnosis but no detailed records of this are available She did have an A1c of 10.6 done in 2014 and was probably given Lantus for some time initially Apparently she was treated with various medications including metformin, Janumet and Tradgenta. Her blood sugars had improved and A1c in 2015 was down to 6.2 She tends to have diarrhea with metformin and Janumet and also she thinks it causes dry mouth Most likely Janumet was stopped in 06/2015 Glipizide was started in 8/17 when blood sugars were higher and A1c was 9%  Recent history:   INSULIN regimen is:   Lantus 20 units daily at 5 pm  Humalog 6 before breakfast--8  before dinner  Non-insulin hypoglycemic drugs the patient is taking are: Victoza 1.8 mg daily for 5 days  Current management, blood sugar patterns and problems identified:  She was told to start mealtime insulin on her visit in February because of high postprandial readings  She thinks she is doing this fairly regularly but has only a few readings after her breakfast and no readings after supper to assess her postprandial control  She again thinks that she has significant problems with feeling hungry during the night when she is not able to sleep and she is eating snacks like bagels and yogurt  Her morning sugars are mostly high especially recently  Overall checking blood sugar very erratically also  She is trying to take her Lantus consistently in evening around suppertime  She has just started taking the higher dose of Victoza and not clear if this is helping, this is not increasing her compliance with  diet        Side effects from medications have been:?  Diarrhea from metformin and Janumet  Compliance with the medical regimen: Irregular  Glucose monitoring:  done 1 time a day or less        Glucometer: Contour        Blood Glucose readings by time of day and averages from monitor download   Mean values apply above for all meters except median for One Touch  PRE-MEAL Fasting Lunch Dinner Overnight  Overall  Glucose range: 158-290  154-321  174  205, 226    Mean/median: 215     214     Self-care: The diet that the patient has been following [id number], tries to limit drinks with sugar .      Typical meal intake: Breakfast is eggs and sausage and lunch is usually a sandwich   Dinner 6 pm             Dietician visit, most recent: 2/18               Exercise:  none  Weight history:  Wt Readings from Last 3 Encounters:  05/29/16 177 lb (80.3 kg)  05/04/16 176 lb (79.8 kg)  04/30/16 175 lb (79.4 kg)    Glycemic control:   Lab Results  Component Value Date   HGBA1C 8.2 (H) 05/28/2016   HGBA1C  9.0 (H) 10/31/2015   HGBA1C 6.2 (H) 06/09/2013   Lab Results  Component Value Date   MICROALBUR 0.1 05/02/2013   CREATININE 1.00 05/28/2016   Lab Results  Component Value Date   MICRALBCREAT 0.2 05/02/2013       Allergies as of 05/29/2016      Reactions   Codeine Anxiety, Other (See Comments)   hallucinations   Macrolides And Ketolides Nausea And Vomiting   All mycins   Procaine Hcl Palpitations   Aspirin Nausea And Vomiting   Burns stomach   Diflucan [fluconazole]    Reactions with other medications   Dilaudid [hydromorphone Hcl]    Morphine And Related    Really bad headaches and nightmares   Sulfa Antibiotics Other (See Comments)      Medication List       Accurate as of 05/29/16 12:14 PM. Always use your most recent med list.          atorvastatin 40 MG tablet Commonly known as:  LIPITOR Take 40 mg by mouth daily.   BD PEN NEEDLE NANO U/F 32G X 4 MM  Misc Generic drug:  Insulin Pen Needle Use to inject insulin 5 times daily   cholecalciferol 1000 units tablet Commonly known as:  VITAMIN D Take 2,000 Units by mouth daily.   divalproex 125 MG DR tablet Commonly known as:  DEPAKOTE Take 250 mg by mouth daily. 1000 mg daily   donepezil 5 MG tablet Commonly known as:  ARICEPT Take 10 mg by mouth at bedtime.   escitalopram 10 MG tablet Commonly known as:  LEXAPRO Take 10 mg by mouth daily.   gabapentin 100 MG capsule Commonly known as:  NEURONTIN Take 1 capsule (100 mg total) by mouth 3 (three) times daily.   glucose blood test strip Commonly known as:  BAYER CONTOUR NEXT TEST Use to test blood sugar 3 times daily Dx code- E11.42   ibuprofen 600 MG tablet Commonly known as:  ADVIL,MOTRIN Take 1 tablet (600 mg total) by mouth every 8 (eight) hours as needed (migraine headaches).   insulin lispro 100 UNIT/ML KiwkPen Commonly known as:  HUMALOG KWIKPEN 6 units at breakfast and lunch and 8 units before supper   LANTUS SOLOSTAR 100 UNIT/ML Solostar Pen Generic drug:  Insulin Glargine Inject 20 Units into the skin daily.   levothyroxine 75 MCG tablet Commonly known as:  SYNTHROID, LEVOTHROID Take 1 tablet (75 mcg total) by mouth daily before breakfast. I tablet daily before breakfast   liothyronine 25 MCG tablet Commonly known as:  CYTOMEL Take 25 mcg by mouth daily.   liraglutide 18 MG/3ML Sopn Commonly known as:  VICTOZA Inject 1.2mg  daily.   lithium 300 MG tablet Take 600 mg by mouth every evening.   LORazepam 2 MG tablet Commonly known as:  ATIVAN Take 2 mg by mouth 4 (four) times daily. 1 to 2 mg 4 times daily   losartan 50 MG tablet Commonly known as:  COZAAR Take 50 mg by mouth daily.   memantine 5 MG tablet Commonly known as:  NAMENDA Take 5 mg by mouth every evening.   metFORMIN 500 MG 24 hr tablet Commonly known as:  GLUCOPHAGE-XR Take 3 tablets (1,500 mg total) by mouth daily with supper.     naltrexone 50 MG tablet Commonly known as:  DEPADE Take 50 mg by mouth daily.   phenazopyridine 200 MG tablet Commonly known as:  PYRIDIUM Take 1 tablet (200 mg total) by mouth 3 (three) times daily as  needed for pain.   QUEtiapine 300 MG tablet Commonly known as:  SEROQUEL Take 200 mg by mouth 2 (two) times daily. Takes 300mg  at 1700 and 300mg  at bedtime   rOPINIRole 0.5 MG tablet Commonly known as:  REQUIP Take 3 tablets (1.5 mg total) by mouth daily.       Allergies:  Allergies  Allergen Reactions  . Codeine Anxiety and Other (See Comments)    hallucinations  . Macrolides And Ketolides Nausea And Vomiting    All mycins  . Procaine Hcl Palpitations  . Aspirin Nausea And Vomiting    Burns stomach  . Diflucan [Fluconazole]     Reactions with other medications  . Dilaudid [Hydromorphone Hcl]   . Morphine And Related     Really bad headaches and nightmares  . Sulfa Antibiotics Other (See Comments)    Past Medical History:  Diagnosis Date  . Alcohol abuse   . Anxiety    takes Valium daily as needed and Ativan daily  . Bilateral hearing loss   . Bipolar 1 disorder ([place])    takes Lithium nightly and Synthroid daily  . Chronic back pain    DDD  . Colitis, ischemic ([place]) 2012  . Confusion    r/t meds  . Depression    takes Prozac daily and Bupspirone   . Diabetes ([place])   . Diabetes mellitus without complication ([place])    takes Janumet daily  . Diverticulosis   . Diverticulosis   . Dyslipidemia    takes Crestor daily  . Fibromyalgia   . Hepatic steatosis 06/18/12   severe  . History of bronchitis    last time at least 35yrs ago  . Hyperlipidemia   . Hypothyroidism   . IBS (irritable bowel syndrome)   . Ischemic colitis ([place])   . Joint pain   . Joint swelling   . Lupus    tumid-skin  . Nephrolithiasis   . Numbness    in right foot  . Osteoarthritis    in hips  . Osteoarthritis cervical spine   . Osteoarthritis of hand    bilateral  . Restless leg  syndrome    takes Requip nightly  . Sciatica   . Urinary frequency   . Urinary leakage   . Urinary urgency   . Urinary, incontinence, stress female   . Walking pneumonia    last time more than 82yrs ago    Past Surgical History:  Procedure Laterality Date  . ABDOMINAL HYSTERECTOMY    . APPENDECTOMY    . COLONOSCOPY    . D&C x 4    . DENTAL SURGERY Left 2 weeks ago    dental implant  . ESOPHAGOGASTRODUODENOSCOPY    . excision of tumors at right neck     angle of jaw '68, benign  . FLEXIBLE SIGMOIDOSCOPY N/A 06/21/2012   Procedure: FLEXIBLE SIGMOIDOSCOPY;  Surgeon: Jerene Bears, MD;  Location: [facility];  Service: Gastroenterology;  Laterality: N/A;  . TONSILLECTOMY    . TOTAL HIP ARTHROPLASTY Right 06/09/2013   Procedure: TOTAL HIP ARTHROPLASTY;  Surgeon: Kerin Salen, MD;  Location: [facility];  Service: Orthopedics;  Laterality: Right;  . TUBAL LIGATION      Family History  Problem Relation Age of Onset  . Drug abuse Mother   . Alcohol abuse Mother   . Alcohol abuse Father   . Colon cancer Paternal Uncle   . Diabetes Brother   . Alcohol abuse Brother     x 2  Social History:  reports that she has never smoked. She has never used smokeless tobacco. She reports that she does not drink alcohol or use drugs.   Review of Systems    Lipid history: On Lipitor 40 mg daily by her PCP Triglycerides relatively high but better than before, labs were nonfasting   Lab Results  Component Value Date   CHOL 176 05/28/2016   HDL 41.00 05/28/2016   LDLDIRECT 100.0 05/28/2016   TRIG (H) 05/28/2016    453.0 Triglyceride is over 400; calculations on Lipids are invalid.   CHOLHDL 4 05/28/2016           Hypertension:None  Most recent eye exam was 2017  Most recent foot exam:03/2016  THYROID: She has been on levothyroxine and Cytomel from her psychiatrist for several years with uncertain diagnosis She had been on 75 g but for the last 6 weeks has been only on 50 g based on  blood test done by psychiatrist but no information available She complains of feeling tired and still has some depression Labs as follows:  Lab Results  Component Value Date   TSH 0.26 (L) 04/30/2016   TSH 0.360 12/31/2015   TSH 1.352 07/05/2015   FREET4 0.56 (L) 04/30/2016   Lab Results  Component Value Date   T3FREE 2.7 04/30/2016     LABS:  Lab on 05/28/2016  Component Date Value Ref Range Status  . Hgb A1c MFr Bld 05/28/2016 8.2* 4.6 - 6.5 % Final  . Sodium 05/28/2016 140  135 - 145 mEq/L Final  . Potassium 05/28/2016 4.0  3.5 - 5.1 mEq/L Final  . Chloride 05/28/2016 106  96 - 112 mEq/L Final  . CO2 05/28/2016 26  19 - 32 mEq/L Final  . Glucose, Bld 05/28/2016 227* 70 - 99 mg/dL Final  . BUN 05/28/2016 13  6 - 23 mg/dL Final  . Creatinine, Ser 05/28/2016 1.00  0.40 - 1.20 mg/dL Final  . Total Bilirubin 05/28/2016 0.3  0.2 - 1.2 mg/dL Final  . Alkaline Phosphatase 05/28/2016 87  39 - 117 U/L Final  . AST 05/28/2016 12  0 - 37 U/L Final  . ALT 05/28/2016 12  0 - 35 U/L Final  . Total Protein 05/28/2016 6.2  6.0 - 8.3 g/dL Final  . Albumin 05/28/2016 3.9  3.5 - 5.2 g/dL Final  . Calcium 05/28/2016 10.0  8.4 - 10.5 mg/dL Final  . GFR 05/28/2016 58.54* >60.00 mL/min Final  . Cholesterol 05/28/2016 176  0 - 200 mg/dL Final  . Triglycerides 05/28/2016 453.0 Triglyceride is over 400; calculations on Lipids are invalid.* 0.0 - 149.0 mg/dL Final  . HDL 05/28/2016 41.00  >39.00 mg/dL Final  . Total CHOL/HDL Ratio 05/28/2016 4   Final  . Direct LDL 05/28/2016 100.0  mg/dL Final    Physical Examination:  BP 118/68   Pulse 85   Ht 5\' 2"  (1.575 m)   Wt 177 lb (80.3 kg)   SpO2 94%   BMI 32.37 kg/m          ASSESSMENT:  Diabetes type 2, uncontrolled     See history of present illness for detailed discussion of current diabetes management, blood sugar patterns and problems identified  Her A1c is still relatively high at 8.2 However she is just starting to make some  changes in her regimen Currently appears to have high readings overnight and this is partly related to her eating carbohydrates during the night when she is not sleeping Not clear what  her sugars are after supper since she does not monitor at that time postprandial readings Not clear if she is benefiting much from [place], currently is starting to take 1.8 without side effects   PLAN:    She is still reluctant to consider the V-go pump as she does not think she can handle this  Discussed need to check her blood sugars consistently after meals especially evening meal  Since she is higher in the morning she will increase her Lantus by 4 units  Also she will need to start taking 4-6 units of Humalog with large carbohydrate snacks at night  Also will give her a trial of metformin as it is unclear whether she had any side effects from this in the past.  Discussed how this works and dosage titration as well as side effects of GI problems.  She can try to increase the dose up to a maximum dose of 1500 mg using the extended release preparation  Discussed blood sugar targets at various times and also to call if blood sugars are low normal  Consider follow-up with diabetes educator and dietitian again    Patient Instructions  Check blood sugars on waking up  3x weekly  Also check blood sugars about 2 hours after a meal and do this after different meals by rotation  Recommended blood sugar levels on waking up is 90-130 and about 2 hours after meal is 130-160  Please bring your blood sugar monitor to each visit, thank you  Lantus 24 units   Exrtr 4-6 units Humalog for snacks at [employer] taking Metformin 500 mg, 1 tablet with your main meal for 7 days.  Occasionally this may initially cause loose stools or nausea.  If  tolerating well after 5 days add a second Metformin tablet (500 mg) at the same time.   Continue adding another tablet after 7 days days if no persistent nausea or  diarrhea until reaching the maximum tolerated dose or the full dose of 3 tablets once daily.    Counseling time on subjects discussed above is over 50% of today's 25 minute visit   Counseling time on subjects discussed above is over 50% of today's 25 minute visit   Zakye Baby 05/29/2016, 12:14 PM   Note: This office note was prepared with Estate agent. Any transcriptional errors that result from this process are unintentional.

## 2016-05-29 NOTE — Patient Instructions (Addendum)
Check blood sugars on waking up  3x weekly  Also check blood sugars about 2 hours after a meal and do this after different meals by rotation  Recommended blood sugar levels on waking up is 90-130 and about 2 hours after meal is 130-160  Please bring your blood sugar monitor to each visit, thank you  Lantus 24 units   Exrtr 4-6 units Humalog for snacks at [employer] taking Metformin 500 mg, 1 tablet with your main meal for 7 days.  Occasionally this may initially cause loose stools or nausea.  If  tolerating well after 5 days add a second Metformin tablet (500 mg) at the same time.   Continue adding another tablet after 7 days days if no persistent nausea or diarrhea until reaching the maximum tolerated dose or the full dose of 3 tablets once daily.

## 2016-06-04 ENCOUNTER — Other Ambulatory Visit: Payer: Self-pay

## 2016-06-04 NOTE — Telephone Encounter (Signed)
Ordered 05/29/16

## 2016-06-08 ENCOUNTER — Telehealth: Payer: Self-pay | Admitting: Dietician

## 2016-06-08 NOTE — Telephone Encounter (Signed)
Brief Nutrition Note: Returned patient call regarding question about sweeteners. Suggested stevia or reducing the sugar in recipes and looking at portion size/carbohydrate content. Patient to call for further questions.  Antonieta Iba, RD, LDN

## 2016-06-09 ENCOUNTER — Other Ambulatory Visit: Payer: Self-pay | Admitting: Endocrinology

## 2016-07-07 ENCOUNTER — Other Ambulatory Visit: Payer: Self-pay | Admitting: Endocrinology

## 2016-07-10 ENCOUNTER — Ambulatory Visit (INDEPENDENT_AMBULATORY_CARE_PROVIDER_SITE_OTHER): Payer: Medicare Other | Admitting: Endocrinology

## 2016-07-10 ENCOUNTER — Encounter: Payer: Self-pay | Admitting: Endocrinology

## 2016-07-10 VITALS — BP 138/88 | HR 89 | Ht 62.0 in | Wt 171.6 lb

## 2016-07-10 DIAGNOSIS — Z794 Long term (current) use of insulin: Secondary | ICD-10-CM

## 2016-07-10 DIAGNOSIS — F339 Major depressive disorder, recurrent, unspecified: Secondary | ICD-10-CM | POA: Diagnosis not present

## 2016-07-10 DIAGNOSIS — E782 Mixed hyperlipidemia: Secondary | ICD-10-CM

## 2016-07-10 DIAGNOSIS — Z79899 Other long term (current) drug therapy: Secondary | ICD-10-CM | POA: Diagnosis not present

## 2016-07-10 DIAGNOSIS — E1165 Type 2 diabetes mellitus with hyperglycemia: Secondary | ICD-10-CM

## 2016-07-10 DIAGNOSIS — E039 Hypothyroidism, unspecified: Secondary | ICD-10-CM | POA: Diagnosis not present

## 2016-07-10 LAB — T4, FREE: Free T4: 0.65 ng/dL (ref 0.60–1.60)

## 2016-07-10 LAB — BASIC METABOLIC PANEL
BUN: 9 mg/dL (ref 6–23)
CO2: 28 mEq/L (ref 19–32)
Calcium: 10.7 mg/dL — ABNORMAL HIGH (ref 8.4–10.5)
Chloride: 101 mEq/L (ref 96–112)
Creatinine, Ser: 1.03 mg/dL (ref 0.40–1.20)
GFR: 56.56 mL/min — ABNORMAL LOW (ref >60.00)
Glucose, Bld: 129 mg/dL — ABNORMAL HIGH (ref 70–99)
Potassium: 4.2 mEq/L (ref 3.5–5.1)
Sodium: 137 mEq/L (ref 135–145)

## 2016-07-10 LAB — TSH: TSH: 0.05 u[IU]/mL — ABNORMAL LOW (ref 0.35–4.50)

## 2016-07-10 LAB — MICROALBUMIN / CREATININE URINE RATIO
Creatinine,U: 137.7 mg/dL
Microalb Creat Ratio: 0.5 mg/g (ref 0.0–30.0)
Microalb, Ur: <0.7 mg/dL (ref 0.0–1.9)

## 2016-07-10 LAB — T3, FREE: T3, Free: 3.2 pg/mL (ref 2.3–4.2)

## 2016-07-10 NOTE — Progress Notes (Signed)
Patient ID: [name] [name], female   DOB: 1945-03-31, 71 y.o.   MRN: [id number]            Reason for Appointment: Follow-up for Type 2 Diabetes  Referring physician: [facility]   History of Present Illness:          Date of diagnosis of type 2 diabetes mellitus: ?  2014        Background history:   She thinks her blood sugar was 400 at the time of diagnosis but no detailed records of this are available She did have an A1c of 10.6 done in 2014 and was probably given Lantus for some time initially Apparently she was treated with various medications including metformin, Janumet and Tradgenta. Her blood sugars had improved and A1c in 2015 was down to 6.2 She tends to have diarrhea with metformin and Janumet and also she thinks it causes dry mouth Most likely Janumet was stopped in 06/2015 Glipizide was started in 8/17 when blood sugars were higher and A1c was 9%  Recent history:   INSULIN regimen is:   Lantus 24 units daily at 5 pm  Humalog 6 before breakfast--8  before dinner  Non-insulin hypoglycemic drugs the patient is taking are: Victoza 1.8 mg daily for 5 days  Current management, blood sugar patterns and problems identified:  She has lost 6 pounds since her last visit  She appears to be cutting back on her portions and also following instructions given by dietitian  However only in the last few days she appears to be having some improvement in her fasting blood sugars  She is now taking HUMALOG for her bedtime snack, sometimes this will be a bowl of ice cream.  Was told to take 4 units but is mostly taking 6 units  Did have a reading of 114 during the night but no hypoglycemia  She is taking 1.8 mg Victoza and this may be helping also but she is now complaining about the cost in the doughnut hole  FASTING blood sugars need to be check more often as she has only one reading in the last 2 weeks which was 149; lantus was increased by 4 units on the last  visit  She has started taking metformin ER 1 tablet daily with a number but she has not increased the dose as instructed, so far tolerating it very well        Side effects from medications have been:?  Diarrhea from metformin and Janumet  Compliance with the medical regimen: Irregular  Glucose monitoring:  done 1 time a day or less        Glucometer: Contour        Blood Glucose readings by time of day and averages from monitor download  Mean values apply above for all meters except median for One Touch  PRE-MEAL Fasting Lunch Dinner Bedtime Overall  Glucose range: 149-331   120-166  162, 181    Mean/median: 180     177     Self-care: The diet that the patient has been following PJ:ASNK, tries to limit drinks with sugar .      Typical meal intake: Breakfast is eggs and sausage and lunch is usually a sandwich   Dinner 6 pm             Dietician visit, most recent: 2/18               Exercise:  none  Weight history:  Wt Readings from  Last 3 Encounters:  07/10/16 171 lb 9.6 oz (77.8 kg)  05/29/16 177 lb (80.3 kg)  05/04/16 176 lb (79.8 kg)    Glycemic control:   Lab Results  Component Value Date   HGBA1C 8.2 (H) 05/28/2016   HGBA1C 9.0 (H) 10/31/2015   HGBA1C 6.2 (H) 06/09/2013   Lab Results  Component Value Date   MICROALBUR 0.1 05/02/2013   CREATININE 1.00 05/28/2016   Lab Results  Component Value Date   MICRALBCREAT 0.2 05/02/2013       Allergies as of 07/10/2016      Reactions   Codeine Anxiety, Other (See Comments)   hallucinations   Macrolides And Ketolides Nausea And Vomiting   All mycins   Procaine Hcl Palpitations   Aspirin Nausea And Vomiting   Burns stomach   Diflucan [fluconazole]    Reactions with other medications   Dilaudid [hydromorphone Hcl]    Morphine And Related    Really bad headaches and nightmares   Sulfa Antibiotics Other (See Comments)      Medication List       Accurate as of 07/10/16  1:01 PM. Always use your most  recent med list.          acetaminophen 500 MG tablet Commonly known as:  TYLENOL Take 500 mg by mouth every 6 (six) hours as needed. Takes 2 as needed   atorvastatin 40 MG tablet Commonly known as:  LIPITOR Take 40 mg by mouth daily.   BD PEN NEEDLE NANO U/F 32G X 4 MM Misc Generic drug:  Insulin Pen Needle Use to inject insulin 5 times daily   cholecalciferol 1000 units tablet Commonly known as:  VITAMIN D Take 2,000 Units by mouth daily.   divalproex 125 MG DR tablet Commonly known as:  DEPAKOTE Take 250 mg by mouth daily. Takes 1000 mg daily   donepezil 5 MG tablet Commonly known as:  ARICEPT Take 10 mg by mouth at bedtime.   escitalopram 10 MG tablet Commonly known as:  LEXAPRO Take 5 mg by mouth daily.   gabapentin 100 MG capsule Commonly known as:  NEURONTIN Take 1 capsule (100 mg total) by mouth 3 (three) times daily.   glucose blood test strip Commonly known as:  BAYER CONTOUR NEXT TEST Use to test blood sugar 3 times daily Dx code- E11.42   insulin lispro 100 UNIT/ML KiwkPen Commonly known as:  HUMALOG KWIKPEN 6 units at breakfast and lunch and 8 units before supper   LANTUS SOLOSTAR 100 UNIT/ML Solostar Pen Generic drug:  Insulin Glargine inject 10 units subcutaneously daily   levothyroxine 75 MCG tablet Commonly known as:  SYNTHROID, LEVOTHROID Take 1 tablet (75 mcg total) by mouth daily before breakfast. I tablet daily before breakfast   liothyronine 25 MCG tablet Commonly known as:  CYTOMEL Take 25 mcg by mouth daily.   lithium 300 MG tablet Take 600 mg by mouth every evening.   LORazepam 2 MG tablet Commonly known as:  ATIVAN Take 2 mg by mouth 4 (four) times daily. 1 to 2 mg 4 times daily   losartan 50 MG tablet Commonly known as:  COZAAR Take 50 mg by mouth daily.   memantine 5 MG tablet Commonly known as:  NAMENDA Take 10 mg by mouth every evening.   metFORMIN 500 MG 24 hr tablet Commonly known as:  GLUCOPHAGE-XR Take 3  tablets (1,500 mg total) by mouth daily with supper.   phenazopyridine 200 MG tablet Commonly known as:  PYRIDIUM Take  1 tablet (200 mg total) by mouth 3 (three) times daily as needed for pain.   QUEtiapine 300 MG tablet Commonly known as:  SEROQUEL Take 200 mg by mouth 2 (two) times daily. Takes 1/2 tablet 2 times daily (100mg  BID)   rOPINIRole 0.5 MG tablet Commonly known as:  REQUIP Take 3 tablets (1.5 mg total) by mouth daily.   VICTOZA 18 MG/3ML Sopn Generic drug:  liraglutide inject 1.2 milligram subcutaneously daily       Allergies:  Allergies  Allergen Reactions  . Codeine Anxiety and Other (See Comments)    hallucinations  . Macrolides And Ketolides Nausea And Vomiting    All mycins  . Procaine Hcl Palpitations  . Aspirin Nausea And Vomiting    Burns stomach  . Diflucan [Fluconazole]     Reactions with other medications  . Dilaudid [Hydromorphone Hcl]   . Morphine And Related     Really bad headaches and nightmares  . Sulfa Antibiotics Other (See Comments)    Past Medical History:  Diagnosis Date  . Alcohol abuse   . Anxiety    takes Valium daily as needed and Ativan daily  . Bilateral hearing loss   . Bipolar 1 disorder ([place])    takes Lithium nightly and Synthroid daily  . Chronic back pain    DDD  . Colitis, ischemic ([place]) 2012  . Confusion    r/t meds  . Depression    takes Prozac daily and Bupspirone   . Diabetes ([place])   . Diabetes mellitus without complication ([place])    takes Janumet daily  . Diverticulosis   . Diverticulosis   . Dyslipidemia    takes Crestor daily  . Fibromyalgia   . Hepatic steatosis 06/18/12   severe  . History of bronchitis    last time at least 21yrs ago  . Hyperlipidemia   . Hypothyroidism   . IBS (irritable bowel syndrome)   . Ischemic colitis ([place])   . Joint pain   . Joint swelling   . Lupus    tumid-skin  . Nephrolithiasis   . Numbness    in right foot  . Osteoarthritis    in hips  . Osteoarthritis  cervical spine   . Osteoarthritis of hand    bilateral  . Restless leg syndrome    takes Requip nightly  . Sciatica   . Urinary frequency   . Urinary leakage   . Urinary urgency   . Urinary, incontinence, stress female   . Walking pneumonia    last time more than 41yrs ago    Past Surgical History:  Procedure Laterality Date  . ABDOMINAL HYSTERECTOMY    . APPENDECTOMY    . COLONOSCOPY    . D&C x 4    . DENTAL SURGERY Left 2 weeks ago    dental implant  . ESOPHAGOGASTRODUODENOSCOPY    . excision of tumors at right neck     angle of jaw '68, benign  . FLEXIBLE SIGMOIDOSCOPY N/A 06/21/2012   Procedure: FLEXIBLE SIGMOIDOSCOPY;  Surgeon: Jerene Bears, MD;  Location: [facility];  Service: Gastroenterology;  Laterality: N/A;  . TONSILLECTOMY    . TOTAL HIP ARTHROPLASTY Right 06/09/2013   Procedure: TOTAL HIP ARTHROPLASTY;  Surgeon: Kerin Salen, MD;  Location: [place];  Service: Orthopedics;  Laterality: Right;  . TUBAL LIGATION      Family History  Problem Relation Age of Onset  . Drug abuse Mother   . Alcohol abuse Mother   .  Alcohol abuse Father   . Colon cancer Paternal Uncle   . Diabetes Brother   . Alcohol abuse Brother     x 2    Social History:  reports that she has never smoked. She has never used smokeless tobacco. She reports that she does not drink alcohol or use drugs.   Review of Systems    Lipid history: On Lipitor 40 mg daily by her PCP Triglycerides relatively high but better than before, labs were nonfasting In March   Lab Results  Component Value Date   CHOL 176 05/28/2016   HDL 41.00 05/28/2016   LDLDIRECT 100.0 05/28/2016   TRIG (H) 05/28/2016    453.0 Triglyceride is over 400; calculations on Lipids are invalid.   CHOLHDL 4 05/28/2016            Most recent eye exam was In 2017  Most recent foot exam:03/2016  THYROID: She has been on levothyroxine and Cytomel from her psychiatrist for several years with uncertain diagnosis She was told  to increase her dose back to 75 g of levothyroxine previously because of low-normal free T4 Has also been on Cytomel 25 g, last free T3 was low-normal Has some fatigue but not excessive, still has low motivation and some depression Also on lithium  Labs as follows:  Lab Results  Component Value Date   TSH 0.26 (L) 04/30/2016   TSH 0.360 12/31/2015   TSH 1.352 07/05/2015   FREET4 0.56 (L) 04/30/2016   Lab Results  Component Value Date   T3FREE 2.7 04/30/2016     LABS:  No visits with results within 1 Week(s) from this visit.  Latest known visit with results is:  Lab on 05/28/2016  Component Date Value Ref Range Status  . Hgb A1c MFr Bld 05/28/2016 8.2* 4.6 - 6.5 % Final  . Sodium 05/28/2016 140  135 - 145 mEq/L Final  . Potassium 05/28/2016 4.0  3.5 - 5.1 mEq/L Final  . Chloride 05/28/2016 106  96 - 112 mEq/L Final  . CO2 05/28/2016 26  19 - 32 mEq/L Final  . Glucose, Bld 05/28/2016 227* 70 - 99 mg/dL Final  . BUN 05/28/2016 13  6 - 23 mg/dL Final  . Creatinine, Ser 05/28/2016 1.00  0.40 - 1.20 mg/dL Final  . Total Bilirubin 05/28/2016 0.3  0.2 - 1.2 mg/dL Final  . Alkaline Phosphatase 05/28/2016 87  39 - 117 U/L Final  . AST 05/28/2016 12  0 - 37 U/L Final  . ALT 05/28/2016 12  0 - 35 U/L Final  . Total Protein 05/28/2016 6.2  6.0 - 8.3 g/dL Final  . Albumin 05/28/2016 3.9  3.5 - 5.2 g/dL Final  . Calcium 05/28/2016 10.0  8.4 - 10.5 mg/dL Final  . GFR 05/28/2016 58.54* >60.00 mL/min Final  . Cholesterol 05/28/2016 176  0 - 200 mg/dL Final  . Triglycerides 05/28/2016 453.0 Triglyceride is over 400; calculations on Lipids are invalid.* 0.0 - 149.0 mg/dL Final  . HDL 05/28/2016 41.00  >39.00 mg/dL Final  . Total CHOL/HDL Ratio 05/28/2016 4   Final  . Direct LDL 05/28/2016 100.0  mg/dL Final    Physical Examination:  BP 138/88   Pulse 89   Ht 5\' 2"  (1.575 m)   Wt 171 lb 9.6 oz (77.8 kg)   SpO2 93%   BMI 31.39 kg/m          ASSESSMENT:  Diabetes type 2,  uncontrolled     See history of present illness  for detailed discussion of current diabetes management, blood sugar patterns and problems identified  She is on Victoza, basal bolus insulin and low-dose metformin currently Last A1c was 8.2 However she is appearing to have better blood sugars and morning sugars are not consistently high However still does need to check more readings at different times and difficult to establish a pattern, no clear postprandial hyperglycemia She is cutting back on portions but still may be getting large snacks like ice cream at bedtime  HYPOTHYROID: May have secondary hypothyroidism and on combination of Cytomel and levothyroxine  PLAN:   She needs to start checking blood sugars more consistently at various times and her husband will help with this  To check coverage for Victoza through the company with patient assistance program  No change in insulin except she can reduce her dosage of Humalog to 4 units for her bedtime snack  May need to increase her Lantus if fasting readings are consistently high.  She will try to gradually increase the dose of metformin and if she is able to take 1500 mg will do so in split doses  She was strongly encouraged to start walking, she currently does not have much motivation  Thyroid levels will be rechecked today  Patient Instructions  Snack coverage 4 units  Metformin 2 daily and after 1 week take 3 daily  Victoza 1.2mg  daily  Check blood sugars on waking up    Also check blood sugars about 2 hours after a meal and do this after different meals by rotation  Recommended blood sugar levels on waking up is 90-130 and about 2 hours after meal is 130-160  Please bring your blood sugar monitor to each visit, thank you  Walk more    Counseling time on subjects discussed above is over 50% of today's 25 minute visit     Javante Nilsson 07/10/2016, 1:01 PM   Note: This office note was prepared with [employer]. Any transcriptional errors that result from this process are unintentional.

## 2016-07-10 NOTE — Patient Instructions (Addendum)
Snack coverage 4 units  Metformin 2 daily and after 1 week take 3 daily  Victoza 1.2mg  daily  Check blood sugars on waking up    Also check blood sugars about 2 hours after a meal and do this after different meals by rotation  Recommended blood sugar levels on waking up is 90-130 and about 2 hours after meal is 130-160  Please bring your blood sugar monitor to each visit, thank you  Walk more

## 2016-07-11 LAB — FRUCTOSAMINE: Fructosamine: 251 umol/L (ref 0–285)

## 2016-07-14 ENCOUNTER — Inpatient Hospital Stay (HOSPITAL_COMMUNITY)
Admission: EM | Admit: 2016-07-14 | Discharge: 2016-07-16 | DRG: 392 | Disposition: A | Discharge status: Home / Self Care | Payer: Medicare Other | Attending: Family Medicine | Admitting: Family Medicine

## 2016-07-14 ENCOUNTER — Encounter (HOSPITAL_COMMUNITY): Payer: Self-pay

## 2016-07-14 ENCOUNTER — Emergency Department (HOSPITAL_COMMUNITY): Payer: Medicare Other

## 2016-07-14 DIAGNOSIS — H9193 Unspecified hearing loss, bilateral: Secondary | ICD-10-CM | POA: Diagnosis present

## 2016-07-14 DIAGNOSIS — Z881 Allergy status to other antibiotic agents status: Secondary | ICD-10-CM

## 2016-07-14 DIAGNOSIS — F1011 Alcohol abuse, in remission: Secondary | ICD-10-CM | POA: Diagnosis present

## 2016-07-14 DIAGNOSIS — K580 Irritable bowel syndrome with diarrhea: Secondary | ICD-10-CM | POA: Diagnosis not present

## 2016-07-14 DIAGNOSIS — Z880 Allergy status to penicillin: Secondary | ICD-10-CM

## 2016-07-14 DIAGNOSIS — G2581 Restless legs syndrome: Secondary | ICD-10-CM | POA: Diagnosis present

## 2016-07-14 DIAGNOSIS — E039 Hypothyroidism, unspecified: Secondary | ICD-10-CM | POA: Diagnosis not present

## 2016-07-14 DIAGNOSIS — K5730 Diverticulosis of large intestine without perforation or abscess without bleeding: Secondary | ICD-10-CM | POA: Diagnosis present

## 2016-07-14 DIAGNOSIS — Z79899 Other long term (current) drug therapy: Secondary | ICD-10-CM

## 2016-07-14 DIAGNOSIS — M5030 Other cervical disc degeneration, unspecified cervical region: Secondary | ICD-10-CM | POA: Diagnosis present

## 2016-07-14 DIAGNOSIS — Z885 Allergy status to narcotic agent status: Secondary | ICD-10-CM

## 2016-07-14 DIAGNOSIS — I10 Essential (primary) hypertension: Secondary | ICD-10-CM | POA: Diagnosis not present

## 2016-07-14 DIAGNOSIS — E1140 Type 2 diabetes mellitus with diabetic neuropathy, unspecified: Secondary | ICD-10-CM | POA: Diagnosis present

## 2016-07-14 DIAGNOSIS — Z96641 Presence of right artificial hip joint: Secondary | ICD-10-CM | POA: Diagnosis present

## 2016-07-14 DIAGNOSIS — K589 Irritable bowel syndrome without diarrhea: Secondary | ICD-10-CM | POA: Diagnosis not present

## 2016-07-14 DIAGNOSIS — Z794 Long term (current) use of insulin: Secondary | ICD-10-CM

## 2016-07-14 DIAGNOSIS — R1084 Generalized abdominal pain: Secondary | ICD-10-CM

## 2016-07-14 DIAGNOSIS — F419 Anxiety disorder, unspecified: Secondary | ICD-10-CM | POA: Diagnosis present

## 2016-07-14 DIAGNOSIS — E1142 Type 2 diabetes mellitus with diabetic polyneuropathy: Secondary | ICD-10-CM | POA: Diagnosis not present

## 2016-07-14 DIAGNOSIS — R197 Diarrhea, unspecified: Secondary | ICD-10-CM | POA: Diagnosis not present

## 2016-07-14 DIAGNOSIS — E785 Hyperlipidemia, unspecified: Secondary | ICD-10-CM | POA: Diagnosis not present

## 2016-07-14 DIAGNOSIS — K529 Noninfective gastroenteritis and colitis, unspecified: Secondary | ICD-10-CM | POA: Diagnosis not present

## 2016-07-14 DIAGNOSIS — Z888 Allergy status to other drugs, medicaments and biological substances status: Secondary | ICD-10-CM

## 2016-07-14 DIAGNOSIS — Z90710 Acquired absence of both cervix and uterus: Secondary | ICD-10-CM

## 2016-07-14 DIAGNOSIS — F341 Dysthymic disorder: Secondary | ICD-10-CM | POA: Diagnosis present

## 2016-07-14 DIAGNOSIS — F0390 Unspecified dementia without behavioral disturbance: Secondary | ICD-10-CM | POA: Diagnosis not present

## 2016-07-14 DIAGNOSIS — Z7984 Long term (current) use of oral hypoglycemic drugs: Secondary | ICD-10-CM

## 2016-07-14 DIAGNOSIS — M797 Fibromyalgia: Secondary | ICD-10-CM | POA: Diagnosis present

## 2016-07-14 DIAGNOSIS — Z886 Allergy status to analgesic agent status: Secondary | ICD-10-CM

## 2016-07-14 DIAGNOSIS — M19042 Primary osteoarthritis, left hand: Secondary | ICD-10-CM | POA: Diagnosis present

## 2016-07-14 DIAGNOSIS — M19041 Primary osteoarthritis, right hand: Secondary | ICD-10-CM | POA: Diagnosis present

## 2016-07-14 DIAGNOSIS — G8929 Other chronic pain: Secondary | ICD-10-CM | POA: Diagnosis present

## 2016-07-14 DIAGNOSIS — F319 Bipolar disorder, unspecified: Secondary | ICD-10-CM | POA: Diagnosis not present

## 2016-07-14 DIAGNOSIS — F32A Depression, unspecified: Secondary | ICD-10-CM | POA: Diagnosis present

## 2016-07-14 DIAGNOSIS — R109 Unspecified abdominal pain: Secondary | ICD-10-CM | POA: Diagnosis not present

## 2016-07-14 DIAGNOSIS — E1159 Type 2 diabetes mellitus with other circulatory complications: Secondary | ICD-10-CM | POA: Diagnosis present

## 2016-07-14 DIAGNOSIS — E1169 Type 2 diabetes mellitus with other specified complication: Secondary | ICD-10-CM | POA: Diagnosis present

## 2016-07-14 LAB — CBC
HCT: 45.2 % (ref 36.0–46.0)
Hemoglobin: 14.6 g/dL (ref 12.0–15.0)
MCH: 30.4 pg (ref 26.0–34.0)
MCHC: 32.3 g/dL (ref 30.0–36.0)
MCV: 94.2 fL (ref 78.0–100.0)
Platelets: 229 10*3/uL (ref 150–400)
RBC: 4.8 MIL/uL (ref 3.87–5.11)
RDW: 12.7 % (ref 11.5–15.5)
WBC: 10.5 10*3/uL (ref 4.0–10.5)

## 2016-07-14 LAB — COMPREHENSIVE METABOLIC PANEL
ALT: 15 U/L (ref 14–54)
AST: 17 U/L (ref 15–41)
Albumin: 3.9 g/dL (ref 3.5–5.0)
Alkaline Phosphatase: 84 U/L (ref 38–126)
Anion gap: 10 (ref 5–15)
BUN: 8 mg/dL (ref 6–20)
CO2: 26 mmol/L (ref 22–32)
Calcium: 10.4 mg/dL — ABNORMAL HIGH (ref 8.9–10.3)
Chloride: 105 mmol/L (ref 101–111)
Creatinine, Ser: 0.87 mg/dL (ref 0.44–1.00)
GFR calc Af Amer: >60 mL/min (ref >60)
GFR calc non Af Amer: >60 mL/min (ref >60)
Glucose, Bld: 159 mg/dL — ABNORMAL HIGH (ref 65–99)
Potassium: 4.4 mmol/L (ref 3.5–5.1)
Sodium: 141 mmol/L (ref 135–145)
Total Bilirubin: 0.7 mg/dL (ref 0.3–1.2)
Total Protein: 7.1 g/dL (ref 6.5–8.1)

## 2016-07-14 LAB — URINALYSIS, ROUTINE W REFLEX MICROSCOPIC
Bacteria, UA: NONE SEEN
Bilirubin Urine: NEGATIVE
Glucose, UA: NEGATIVE mg/dL
Hgb urine dipstick: NEGATIVE
Ketones, ur: NEGATIVE mg/dL
Nitrite: NEGATIVE
Protein, ur: NEGATIVE mg/dL
Specific Gravity, Urine: 1.025 (ref 1.005–1.030)
pH: 6 (ref 5.0–8.0)

## 2016-07-14 LAB — I-STAT CHEM 8, ED
BUN: 6 mg/dL (ref 6–20)
Calcium, Ion: 1.3 mmol/L (ref 1.15–1.40)
Chloride: 105 mmol/L (ref 101–111)
Creatinine, Ser: 0.8 mg/dL (ref 0.44–1.00)
Glucose, Bld: 133 mg/dL — ABNORMAL HIGH (ref 65–99)
HCT: 46 % (ref 36.0–46.0)
Hemoglobin: 15.6 g/dL — ABNORMAL HIGH (ref 12.0–15.0)
Potassium: 4.1 mmol/L (ref 3.5–5.1)
Sodium: 142 mmol/L (ref 135–145)
TCO2: 24 mmol/L (ref 0–100)

## 2016-07-14 LAB — I-STAT CG4 LACTIC ACID, ED
Lactic Acid, Venous: 3.61 mmol/L (ref 0.5–1.9)
Lactic Acid, Venous: 2.95 mmol/L (ref 0.5–1.9)
Lactic Acid, Venous: 1.82 mmol/L (ref 0.5–1.9)

## 2016-07-14 LAB — LITHIUM LEVEL: Lithium Lvl: 0.57 mmol/L — ABNORMAL LOW (ref 0.60–1.20)

## 2016-07-14 LAB — LIPASE, BLOOD: Lipase: 27 U/L (ref 11–51)

## 2016-07-14 LAB — CBG MONITORING, ED: Glucose-Capillary: 185 mg/dL — ABNORMAL HIGH (ref 65–99)

## 2016-07-14 LAB — GLUCOSE, CAPILLARY: Glucose-Capillary: 87 mg/dL (ref 65–99)

## 2016-07-14 MED ORDER — ONDANSETRON HCL 4 MG PO TABS: 4.0000 mg | ORAL_TABLET | Freq: Four times a day (QID) | ORAL | Status: DC | PRN

## 2016-07-14 MED ORDER — ONDANSETRON HCL 4 MG/2ML IJ SOLN: 4.0000 mg | Freq: Four times a day (QID) | INTRAMUSCULAR | Status: DC | PRN

## 2016-07-14 MED ORDER — LOSARTAN POTASSIUM 50 MG PO TABS
50.0000 mg | ORAL_TABLET | Freq: Every day | ORAL | Status: DC
  Administered 2016-07-15 – 2016-07-16 (×2): 50 mg via ORAL
  Filled 2016-07-14 (×2): qty 1

## 2016-07-14 MED ORDER — ACETAMINOPHEN 500 MG PO TABS
500.0000 mg | ORAL_TABLET | Freq: Four times a day (QID) | ORAL | Status: DC | PRN
  Administered 2016-07-15 – 2016-07-16 (×2): 500 mg via ORAL
  Filled 2016-07-14 (×2): qty 1

## 2016-07-14 MED ORDER — LORAZEPAM 1 MG PO TABS
1.0000 mg | ORAL_TABLET | Freq: Four times a day (QID) | ORAL | Status: DC | PRN
  Administered 2016-07-14: 1 mg via ORAL
  Filled 2016-07-14: qty 1

## 2016-07-14 MED ORDER — ROPINIROLE HCL 1 MG PO TABS
1.0000 mg | ORAL_TABLET | ORAL | Status: DC
  Administered 2016-07-15 – 2016-07-16 (×2): 1 mg via ORAL
  Filled 2016-07-14 (×2): qty 1

## 2016-07-14 MED ORDER — GABAPENTIN 300 MG PO CAPS
300.0000 mg | ORAL_CAPSULE | Freq: Two times a day (BID) | ORAL | Status: DC
  Administered 2016-07-14 – 2016-07-16 (×4): 300 mg via ORAL
  Filled 2016-07-14 (×4): qty 1

## 2016-07-14 MED ORDER — LITHIUM CARBONATE 300 MG PO CAPS
600.0000 mg | ORAL_CAPSULE | Freq: Every day | ORAL | Status: DC
  Administered 2016-07-14 – 2016-07-15 (×2): 600 mg via ORAL
  Filled 2016-07-14 (×3): qty 2

## 2016-07-14 MED ORDER — LEVOTHYROXINE SODIUM 50 MCG PO TABS
50.0000 ug | ORAL_TABLET | Freq: Every day | ORAL | Status: DC
  Administered 2016-07-15 – 2016-07-16 (×2): 50 ug via ORAL
  Filled 2016-07-14 (×2): qty 1

## 2016-07-14 MED ORDER — INSULIN GLARGINE 100 UNIT/ML ~~LOC~~ SOLN
15.0000 [IU] | Freq: Every day | SUBCUTANEOUS | Status: DC
  Administered 2016-07-15: 15 [IU] via SUBCUTANEOUS
  Filled 2016-07-14 (×2): qty 0.15

## 2016-07-14 MED ORDER — QUETIAPINE FUMARATE 100 MG PO TABS
100.0000 mg | ORAL_TABLET | Freq: Two times a day (BID) | ORAL | Status: DC
  Administered 2016-07-14 – 2016-07-16 (×4): 100 mg via ORAL
  Filled 2016-07-14 (×5): qty 1

## 2016-07-14 MED ORDER — INSULIN ASPART 100 UNIT/ML ~~LOC~~ SOLN
0.0000 [IU] | Freq: Three times a day (TID) | SUBCUTANEOUS | Status: DC
  Administered 2016-07-16: 2 [IU] via SUBCUTANEOUS

## 2016-07-14 MED ORDER — ACETAMINOPHEN 500 MG PO TABS
1000.0000 mg | ORAL_TABLET | Freq: Once | ORAL | Status: AC
  Administered 2016-07-14: 1000 mg via ORAL
  Filled 2016-07-14: qty 2

## 2016-07-14 MED ORDER — IOPAMIDOL (ISOVUE-300) INJECTION 61%
INTRAVENOUS | Status: AC
  Administered 2016-07-14: 100 mL
  Filled 2016-07-14: qty 100

## 2016-07-14 MED ORDER — ATORVASTATIN CALCIUM 40 MG PO TABS
40.0000 mg | ORAL_TABLET | Freq: Every day | ORAL | Status: DC
  Administered 2016-07-14 – 2016-07-16 (×3): 40 mg via ORAL
  Filled 2016-07-14 (×3): qty 1

## 2016-07-14 MED ORDER — ONDANSETRON HCL 4 MG/2ML IJ SOLN
4.0000 mg | Freq: Once | INTRAMUSCULAR | Status: AC
  Administered 2016-07-14: 4 mg via INTRAVENOUS
  Filled 2016-07-14: qty 2

## 2016-07-14 MED ORDER — LITHIUM CARBONATE 300 MG PO TABS
600.0000 mg | ORAL_TABLET | Freq: Every evening | ORAL | Status: DC
  Filled 2016-07-14 (×4): qty 2

## 2016-07-14 MED ORDER — SODIUM CHLORIDE 0.9 % IV BOLUS (SEPSIS)
1000.0000 mL | Freq: Once | INTRAVENOUS | Status: AC
  Administered 2016-07-14: 1000 mL via INTRAVENOUS

## 2016-07-14 MED ORDER — DONEPEZIL HCL 10 MG PO TABS
10.0000 mg | ORAL_TABLET | Freq: Every day | ORAL | Status: DC
  Administered 2016-07-14 – 2016-07-15 (×2): 10 mg via ORAL
  Filled 2016-07-14 (×2): qty 1

## 2016-07-14 MED ORDER — DIVALPROEX SODIUM ER 500 MG PO TB24
1000.0000 mg | ORAL_TABLET | Freq: Every day | ORAL | Status: DC
  Administered 2016-07-14 – 2016-07-15 (×2): 1000 mg via ORAL
  Filled 2016-07-14 (×2): qty 2

## 2016-07-14 MED ORDER — ROPINIROLE HCL 1 MG PO TABS: 1.5000 mg | ORAL_TABLET | Freq: Every day | ORAL | Status: DC

## 2016-07-14 MED ORDER — MEMANTINE HCL 10 MG PO TABS
10.0000 mg | ORAL_TABLET | Freq: Every evening | ORAL | Status: DC
  Administered 2016-07-14 – 2016-07-16 (×3): 10 mg via ORAL
  Filled 2016-07-14 (×3): qty 1

## 2016-07-14 MED ORDER — LOPERAMIDE HCL 2 MG PO CAPS
2.0000 mg | ORAL_CAPSULE | Freq: Four times a day (QID) | ORAL | Status: DC | PRN
  Administered 2016-07-16: 2 mg via ORAL
  Filled 2016-07-14: qty 1

## 2016-07-14 MED ORDER — ESCITALOPRAM OXALATE 10 MG PO TABS
5.0000 mg | ORAL_TABLET | Freq: Every day | ORAL | Status: DC
  Administered 2016-07-15 – 2016-07-16 (×2): 5 mg via ORAL
  Filled 2016-07-14 (×2): qty 1

## 2016-07-14 MED ORDER — ROPINIROLE HCL 1 MG PO TABS
2.0000 mg | ORAL_TABLET | Freq: Every day | ORAL | Status: DC
  Administered 2016-07-14 – 2016-07-15 (×2): 2 mg via ORAL
  Filled 2016-07-14 (×2): qty 2

## 2016-07-14 MED ORDER — SODIUM CHLORIDE 0.9 % IV SOLN
INTRAVENOUS | Status: DC
  Administered 2016-07-14 – 2016-07-15 (×2): via INTRAVENOUS

## 2016-07-14 NOTE — ED Provider Notes (Signed)
[facility] DEPT [facility] Provider Note   CSN: [id number] Arrival date & time: 07/14/16  7673     History   Chief Complaint Chief Complaint  Patient presents with  . Abdominal Pain  . Diarrhea    HPI [name] [name] is a 71 y.o. female.  71 yo F with a cc of diffuse abdominal pain. Going on for past month.  Denies fever, bloody diarrhea.  Trying imodium without improvement. Hx of colitis.  Sees [facility].  At her last admission for colitis the gastroenterologist told her that she likely need to be scoped when she had her acute symptoms. Patient has daily pain and diarrhea. Unable to count how many bowel movements she has.   The history is provided by the patient.  Abdominal Pain   This is a new problem. The current episode started less than 1 hour ago. The problem occurs constantly. The problem has not changed since onset.The pain is located in the generalized abdominal region. The quality of the pain is cramping. The pain is at a severity of 8/10. The pain is severe. Associated symptoms include diarrhea and nausea. Pertinent negatives include fever, vomiting, dysuria, headaches, arthralgias and myalgias. Nothing aggravates the symptoms. Nothing relieves the symptoms.  Diarrhea   Associated symptoms include abdominal pain. Pertinent negatives include no vomiting, no chills, no headaches, no arthralgias and no myalgias.    Past Medical History:  Diagnosis Date  . Alcohol abuse   . Anxiety    takes Valium daily as needed and Ativan daily  . Bilateral hearing loss   . Bipolar 1 disorder ([place])    takes Lithium nightly and Synthroid daily  . Chronic back pain    DDD  . Colitis, ischemic ([place]) 2012  . Confusion    r/t meds  . Depression    takes Prozac daily and Bupspirone   . Diabetes ([place])   . Diabetes mellitus without complication ([place])    takes Janumet daily  . Diverticulosis   . Diverticulosis   . Dyslipidemia    takes Crestor daily  . Fibromyalgia   . Hepatic  steatosis 06/18/12   severe  . History of bronchitis    last time at least 37yrs ago  . Hyperlipidemia   . Hypothyroidism   . IBS (irritable bowel syndrome)   . Ischemic colitis ([place])   . Joint pain   . Joint swelling   . Lupus    tumid-skin  . Nephrolithiasis   . Numbness    in right foot  . Osteoarthritis    in hips  . Osteoarthritis cervical spine   . Osteoarthritis of hand    bilateral  . Restless leg syndrome    takes Requip nightly  . Sciatica   . Urinary frequency   . Urinary leakage   . Urinary urgency   . Urinary, incontinence, stress female   . Walking pneumonia    last time more than 47yrs ago    Patient Active Problem List   Diagnosis Date Noted  . Hypothyroidism 07/14/2016  . Diarrhea   . Generalized abdominal pain   . Major depressive disorder, recurrent severe without psychotic features ([place]) 12/31/2015  . Nausea without vomiting   . Colitis 10/28/2015  . Unintentional poisoning by psychotropic drug 07/05/2015  . Restless leg syndrome 07/05/2015  . Diabetic polyneuropathy associated with type 2 diabetes mellitus ([place]) 09/21/2014  . Arthritis of left hip 06/09/2013  . Arthritis of right hip 06/08/2013  . Hip pain, right 05/02/2013  .  IBS (irritable bowel syndrome) 01/02/2013  . Unspecified vitamin D deficiency 07/05/2012  . Pure hyperglyceridemia 07/04/2012  . Diabetes mellitus with diabetic neuropathy, without long-term current use of insulin ([place]) 06/18/2012  . Hypertension 06/18/2012  . Bipolar 1 disorder ([place]) 06/18/2012  . Low back pain 02/25/2011  . Acute ischemic colitis ([place]) 01/06/2011  . Sciatica 12/19/2010  . Fibromyalgia 10/28/2010  . Primary osteoarthritis of right hip 10/24/2010  . Hyperlipidemia with target low density lipoprotein (LDL) cholesterol less than 100 mg/dL 05/05/2010  . DEPRESSION/ANXIETY 05/05/2010  . ABUSE, ALCOHOL, IN REMISSION 05/05/2010  . OTITIS MEDIA, CHRONIC 05/05/2010  . HEARING LOSS, BILATERAL 05/05/2010  .  ALLERGIC RHINITIS, SEASONAL, MILD 05/05/2010  . URINARY INCONTINENCE, STRESS, FEMALE 05/05/2010  . OSTEOARTHRITIS, HANDS, BILATERAL 05/05/2010  . OSTEOARTHRITIS, CERVICAL SPINE 05/05/2010    Past Surgical History:  Procedure Laterality Date  . ABDOMINAL HYSTERECTOMY    . APPENDECTOMY    . COLONOSCOPY    . D&C x 4    . DENTAL SURGERY Left 2 weeks ago    dental implant  . ESOPHAGOGASTRODUODENOSCOPY    . excision of tumors at right neck     angle of jaw '68, benign  . FLEXIBLE SIGMOIDOSCOPY N/A 06/21/2012   Procedure: FLEXIBLE SIGMOIDOSCOPY;  Surgeon: Jerene Bears, MD;  Location: [facility];  Service: Gastroenterology;  Laterality: N/A;  . TONSILLECTOMY    . TOTAL HIP ARTHROPLASTY Right 06/09/2013   Procedure: TOTAL HIP ARTHROPLASTY;  Surgeon: Kerin Salen, MD;  Location: [facility];  Service: Orthopedics;  Laterality: Right;  . TUBAL LIGATION      OB History    No data available       Home Medications    Prior to Admission medications   Medication Sig Start Date End Date Taking? Authorizing Provider  acetaminophen (TYLENOL) 500 MG tablet Take 500-1,000 mg by mouth every 6 (six) hours as needed for mild pain, moderate pain, fever or headache.    Yes [provider]  atorvastatin (LIPITOR) 40 MG tablet Take 40 mg by mouth at bedtime.    Yes [provider]  cholecalciferol (VITAMIN D) 1000 units tablet Take 2,000 Units by mouth daily.    Yes [provider]  divalproex (DEPAKOTE ER) 250 MG 24 hr tablet Take 1,000 mg by mouth at bedtime.   Yes [provider]  donepezil (ARICEPT) 10 MG tablet Take 10 mg by mouth at bedtime.   Yes [provider]  escitalopram (LEXAPRO) 10 MG tablet Take 10 mg by mouth daily.    Yes [provider]  gabapentin (NEURONTIN) 100 MG capsule Take 300 mg by mouth 2 (two) times daily.   Yes [provider]  HYDROcodone-acetaminophen (NORCO/VICODIN) 5-325 MG tablet Take 1 tablet by mouth every 6  (six) hours as needed for moderate pain.   Yes [provider]  insulin glargine (LANTUS) 100 unit/mL SOPN Inject 10 Units into the skin daily.   Yes [provider]  insulin lispro (HUMALOG KWIKPEN) 100 UNIT/ML KiwkPen Inject 6-8 Units into the skin 3 (three) times daily. Pt uses 6 units before breakfast and lunch and 8 units before dinner.   Yes [provider]  levothyroxine (SYNTHROID, LEVOTHROID) 75 MCG tablet Take 1 tablet (75 mcg total) by mouth daily before breakfast. I tablet daily before breakfast 05/29/16  Yes Elayne Snare, MD  liothyronine (CYTOMEL) 25 MCG tablet Take 25 mcg by mouth daily.   Yes [provider]  lithium 300 MG tablet Take 600  mg by mouth at bedtime.    Yes [provider]  LORazepam (ATIVAN) 2 MG tablet Take 1-2 mg by mouth 4 (four) times daily as needed for anxiety.    Yes [provider]  losartan (COZAAR) 50 MG tablet Take 50 mg by mouth daily.   Yes [provider]  memantine (NAMENDA) 10 MG tablet Take 10 mg by mouth at bedtime.   Yes [provider]  metFORMIN (GLUCOPHAGE-XR) 500 MG 24 hr tablet Take 3 tablets (1,500 mg total) by mouth daily with supper. 05/29/16  Yes Elayne Snare, MD  QUEtiapine (SEROQUEL) 200 MG tablet Take 200 mg by mouth at bedtime.    Yes [provider]  rOPINIRole (REQUIP) 0.5 MG tablet Take 3.5 mg by mouth at bedtime.   Yes [provider]  VICTOZA 18 MG/3ML SOPN inject 1.2 milligram subcutaneously daily 07/08/16  Yes Elayne Snare, MD    Family History Family History  Problem Relation Age of Onset  . Drug abuse Mother   . Alcohol abuse Mother   . Alcohol abuse Father   . Colon cancer Paternal Uncle   . Diabetes Brother   . Alcohol abuse Brother     x 2    Social History Social History  Substance Use Topics  . Smoking status: Never Smoker  . Smokeless tobacco: Never Used  . Alcohol use No     Allergies   Codeine; Macrolides and ketolides;  Procaine hcl; Aspirin; Diflucan [fluconazole]; Dilaudid [hydromorphone hcl]; Morphine and related; Other; and Sulfa antibiotics   Review of Systems Review of Systems  Constitutional: Negative for chills and fever.  HENT: Negative for congestion and rhinorrhea.   Eyes: Negative for redness and visual disturbance.  Respiratory: Negative for shortness of breath and wheezing.   Cardiovascular: Negative for chest pain and palpitations.  Gastrointestinal: Positive for abdominal pain, diarrhea and nausea. Negative for vomiting.  Genitourinary: Negative for dysuria and urgency.  Musculoskeletal: Negative for arthralgias and myalgias.  Skin: Negative for pallor and wound.  Neurological: Negative for dizziness and headaches.     Physical Exam Updated Vital Signs BP 126/61 (BP Location: Right Arm)   Pulse 86   Temp 97.7 F (36.5 C) (Oral)   Resp 18   Ht 5\' 2"  (1.575 m)   Wt 173 lb (78.5 kg)   SpO2 98%   BMI 31.64 kg/m   Physical Exam  Constitutional: She is oriented to person, place, and time. She appears well-developed and well-nourished. No distress.  HENT:  Head: Normocephalic and atraumatic.  Eyes: EOM are normal. Pupils are equal, round, and reactive to light.  Neck: Normal range of motion. Neck supple.  Cardiovascular: Normal rate and regular rhythm.  Exam reveals no gallop and no friction rub.   No murmur heard. Pulmonary/Chest: Effort normal. She has no wheezes. She has no rales.  Abdominal: Soft. She exhibits no distension and no mass. There is no tenderness. There is no guarding.  Musculoskeletal: She exhibits no edema or tenderness.  Neurological: She is alert and oriented to person, place, and time.  Skin: Skin is warm and dry. She is not diaphoretic.  Psychiatric: She has a normal mood and affect. Her behavior is normal.  Nursing note and vitals reviewed.    ED Treatments / Results  Labs (all labs ordered are listed, but only abnormal results are displayed) Labs  Reviewed  COMPREHENSIVE METABOLIC PANEL - Abnormal; Notable for the following:       Result Value   Glucose,  Bld 159 (*)    Calcium 10.4 (*)    All other components within normal limits  URINALYSIS, ROUTINE W REFLEX MICROSCOPIC - Abnormal; Notable for the following:    Leukocytes, UA TRACE (*)    Squamous Epithelial / LPF 0-5 (*)    All other components within normal limits  LITHIUM LEVEL - Abnormal; Notable for the following:    Lithium Lvl 0.57 (*)    All other components within normal limits  COMPREHENSIVE METABOLIC PANEL - Abnormal; Notable for the following:    Total Protein 5.6 (*)    Albumin 3.0 (*)    ALT 10 (*)    All other components within normal limits  I-STAT CHEM 8, ED - Abnormal; Notable for the following:    Glucose, Bld 133 (*)    Hemoglobin 15.6 (*)    All other components within normal limits  I-STAT CG4 LACTIC ACID, ED - Abnormal; Notable for the following:    Lactic Acid, Venous 3.61 (*)    All other components within normal limits  CBG MONITORING, ED - Abnormal; Notable for the following:    Glucose-Capillary 185 (*)    All other components within normal limits  I-STAT CG4 LACTIC ACID, ED - Abnormal; Notable for the following:    Lactic Acid, Venous 2.95 (*)    All other components within normal limits  GASTROINTESTINAL PANEL BY PCR, STOOL (REPLACES STOOL CULTURE)  C DIFFICILE QUICK SCREEN W PCR REFLEX  C DIFFICILE QUICK SCREEN W PCR REFLEX  LIPASE, BLOOD  CBC  CBC  GLUCOSE, CAPILLARY  I-STAT CG4 LACTIC ACID, ED  I-STAT CG4 LACTIC ACID, ED    EKG  EKG Interpretation None       Radiology Ct Abdomen Pelvis W Contrast  Result Date: 07/14/2016 CLINICAL DATA:  Right-sided abdominal pain and diarrhea. History of colitis, chronic right-sided UPJ obstruction/stricture and prior hysterectomy. EXAM: CT ABDOMEN AND PELVIS WITH CONTRAST TECHNIQUE: Multidetector CT imaging of the abdomen and pelvis was performed using the standard protocol following  bolus administration of intravenous contrast. CONTRAST:  100 mL ISOVUE-300 IOPAMIDOL (ISOVUE-300) INJECTION 61% COMPARISON:  CT of the abdomen and pelvis without contrast on 04/26/2016 and CTA of the abdomen and pelvis on 10/28/2015 FINDINGS: Lower chest: No acute abnormality. Hepatobiliary: The liver demonstrates mild steatosis. No hepatic masses or biliary dilatation. The gallbladder appears normal. Pancreas: Unremarkable. No pancreatic ductal dilatation or surrounding inflammatory changes. Spleen: Normal in size without focal abnormality. Adrenals/Urinary Tract: Adrenal glands appear normal. The kidneys have a stable appearance with stable mildly dilated right renal pelvis and renal collecting system without delayed excretion of contrast. Findings are consistent with chronic UPJ stenosis. No calculi or renal masses identified. Stomach/Bowel: Bowel shows no evidence of obstruction or inflammation. Stable diverticulosis of the sigmoid colon without evidence of diverticulitis. Vascular/Lymphatic: No significant vascular findings are present. No enlarged abdominal or pelvic lymph nodes. Reproductive: Status post hysterectomy. No adnexal masses. Other: No abdominal wall hernia or abnormality. No abdominopelvic ascites or focal abscess identified. Musculoskeletal: No acute or significant osseous findings. IMPRESSION: No acute findings. Stable hepatic steatosis, right renal UPJ stenosis and sigmoid colonic diverticulosis. Electronically Signed   By: Aletta Edouard M.D.   On: 07/14/2016 11:47    Procedures Procedures (including critical care time)  Medications Ordered in ED Medications  divalproex (DEPAKOTE ER) 24 hr tablet 1,000 mg (1,000 mg Oral Given 07/14/16 2140)  donepezil (ARICEPT) tablet 10 mg (10 mg Oral Given 07/14/16 2141)  QUEtiapine (SEROQUEL) tablet 100 mg (  100 mg Oral Given 07/14/16 2141)  acetaminophen (TYLENOL) tablet 500 mg (not administered)  insulin glargine (LANTUS) injection 15 Units (15  Units Subcutaneous Not Given 07/14/16 2211)  escitalopram (LEXAPRO) tablet 5 mg (5 mg Oral Not Given 07/14/16 1823)  levothyroxine (SYNTHROID, LEVOTHROID) tablet 50 mcg (not administered)  atorvastatin (LIPITOR) tablet 40 mg (40 mg Oral Given 07/14/16 1830)  LORazepam (ATIVAN) tablet 1 mg (1 mg Oral Given 07/14/16 2243)  losartan (COZAAR) tablet 50 mg (not administered)  memantine (NAMENDA) tablet 10 mg (10 mg Oral Given 07/14/16 1830)  gabapentin (NEURONTIN) capsule 300 mg (300 mg Oral Given 07/14/16 2140)  0.9 %  sodium chloride infusion ( Intravenous New Bag/Given 07/15/16 0638)  ondansetron (ZOFRAN) tablet 4 mg (not administered)    Or  ondansetron (ZOFRAN) injection 4 mg (not administered)  insulin aspart (novoLOG) injection 0-9 Units (not administered)  loperamide (IMODIUM) capsule 2 mg (not administered)  rOPINIRole (REQUIP) tablet 1 mg (not administered)    And  rOPINIRole (REQUIP) tablet 2 mg (2 mg Oral Given 07/14/16 2141)  lithium carbonate capsule 600 mg (600 mg Oral Given 07/14/16 2209)  ondansetron (ZOFRAN) injection 4 mg (4 mg Intravenous Given 07/14/16 1111)  acetaminophen (TYLENOL) tablet 1,000 mg (1,000 mg Oral Given 07/14/16 1110)  iopamidol (ISOVUE-300) 61 % injection (100 mLs  Contrast Given 07/14/16 1123)  sodium chloride 0.9 % bolus 1,000 mL (0 mLs Intravenous Stopped 07/14/16 1443)     Initial Impression / Assessment and Plan / ED Course  I have reviewed the triage vital signs and the nursing notes.  Pertinent labs & imaging results that were available during my care of the patient were reviewed by me and considered in my medical decision making (see chart for details).     71 yo F With a chief complaints of diffuse abdominal pain and diarrhea. Going on for 4 weeks now. Denies any blood in her stool or dark stool. Will obtain a CT scan.  CT scan with no specific finding. Patient does have an elevated lactic acidosis, mild clearing on repeat. Patient's last gastroenterology note that I  see in system there is concern for ischemic colitis where they would like to attempt to do a colonoscopy while she is having symptoms. I will discuss the case with GI.  Patient with continued severe pain, though she is very sleepy and slurring her words on exam. Discussed with hospitalist for admission.  The patients results and plan were reviewed and discussed.   Any x-rays performed were independently reviewed by myself.   Differential diagnosis were considered with the presenting HPI.  Medications  divalproex (DEPAKOTE ER) 24 hr tablet 1,000 mg (1,000 mg Oral Given 07/14/16 2140)  donepezil (ARICEPT) tablet 10 mg (10 mg Oral Given 07/14/16 2141)  QUEtiapine (SEROQUEL) tablet 100 mg (100 mg Oral Given 07/14/16 2141)  acetaminophen (TYLENOL) tablet 500 mg (not administered)  insulin glargine (LANTUS) injection 15 Units (15 Units Subcutaneous Not Given 07/14/16 2211)  escitalopram (LEXAPRO) tablet 5 mg (5 mg Oral Not Given 07/14/16 1823)  levothyroxine (SYNTHROID, LEVOTHROID) tablet 50 mcg (not administered)  atorvastatin (LIPITOR) tablet 40 mg (40 mg Oral Given 07/14/16 1830)  LORazepam (ATIVAN) tablet 1 mg (1 mg Oral Given 07/14/16 2243)  losartan (COZAAR) tablet 50 mg (not administered)  memantine (NAMENDA) tablet 10 mg (10 mg Oral Given 07/14/16 1830)  gabapentin (NEURONTIN) capsule 300 mg (300 mg Oral Given 07/14/16 2140)  0.9 %  sodium chloride infusion ( Intravenous New Bag/Given 07/15/16 1497)  ondansetron (  ZOFRAN) tablet 4 mg (not administered)    Or  ondansetron (ZOFRAN) injection 4 mg (not administered)  insulin aspart (novoLOG) injection 0-9 Units (not administered)  loperamide (IMODIUM) capsule 2 mg (not administered)  rOPINIRole (REQUIP) tablet 1 mg (not administered)    And  rOPINIRole (REQUIP) tablet 2 mg (2 mg Oral Given 07/14/16 2141)  lithium carbonate capsule 600 mg (600 mg Oral Given 07/14/16 2209)  ondansetron (ZOFRAN) injection 4 mg (4 mg Intravenous Given 07/14/16 1111)  acetaminophen  (TYLENOL) tablet 1,000 mg (1,000 mg Oral Given 07/14/16 1110)  iopamidol (ISOVUE-300) 61 % injection (100 mLs  Contrast Given 07/14/16 1123)  sodium chloride 0.9 % bolus 1,000 mL (0 mLs Intravenous Stopped 07/14/16 1443)    Vitals:   07/14/16 1724 07/14/16 1811 07/14/16 2100 07/15/16 0453  BP: (!) 124/53 (!) 126/59 130/68 126/61  Pulse: 82 79 83 86  Resp: 18 18 16 18   Temp:  97.6 F (36.4 C) 98.4 F (36.9 C) 97.7 F (36.5 C)  TempSrc:  Oral Oral Oral  SpO2: 95% 100% 97% 98%  Weight:  173 lb (78.5 kg)    Height:  5\' 2"  (1.575 m)      Final diagnoses:  Colitis    Admission/ observation were discussed with the admitting physician, patient and/or family and they are comfortable with the plan.    Final Clinical Impressions(s) / ED Diagnoses   Final diagnoses:  Colitis    New Prescriptions Current Discharge Medication List       Deno Etienne, DO 07/15/16 7366
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
## 2016-07-14 NOTE — ED Triage Notes (Signed)
Patient reports a history of colitis. Patient states she has had abdominal pain and diarrhea x 3 weeks. Patient states she has been taking Imodium with no relief. Patient denies any blood in her stool.

## 2016-07-14 NOTE — H&P (Signed)
History and Physical    Tatyanna Cronk WCB:[id number] DOB: Feb 11, 1946 DOA: 07/14/2016  I have briefly reviewed the patient's prior medical records in [facility]  PCP: London Pepper, MD  Patient coming from: home  Chief Complaint: Abdominal pain, nausea and diarrhea for 3 weeks  HPI: [name] [name] is a 71 y.o. female with medical history significant of anxiety, diabetes mellitus on insulin, bipolar disorder, chronic back pain, fibromyalgia, IBS, presents to the emergency room with chief complaint of abdominal pain, nausea as well as diarrhea for the past 3 weeks.  Patient also complains of generalized weakness, however she has been  feeling this way for a number of years.  She states that she has been having poor p.o. intake over the last 3 weeks as well, and she has noticed an associated 6 pound weight loss.  There is no relationship between her pain and eating, however has noticed that about 1 hour after she eats she is to have a bowel movement.  She denies any blood in her stool or dark tarry stools, however she has never looked.  She denies any fever or chills.  She has no chest pain or shortness of breath.  She had similar episodes of colitis in the past, she was worked up by Dr. Olevia Perches, and recently saw Dr. Loletha Carrow at the end of 2017, who recommended at that time when an episode like this is happening for her to be evaluated right away with a colonoscopy.  ED Course: In the ED her vital signs are stable, she is afebrile, blood pressure is normal, her blood work is pertinent for a lactic acid of 3.6, she is mildly hemoconcentrated with a hemoglobin of 15.  Her lactic acid is improved to 1.8 after IV fluids.  Gastroenterology was consulted, and [facility] was asked for admission.  Review of Systems: As per HPI otherwise 10 point review of systems negative.   Past Medical History:  Diagnosis Date  . Alcohol abuse   . Anxiety    takes Valium daily as needed and Ativan daily  . Bilateral  hearing loss   . Bipolar 1 disorder ([place])    takes Lithium nightly and Synthroid daily  . Chronic back pain    DDD  . Colitis, ischemic ([place]) 2012  . Confusion    r/t meds  . Depression    takes Prozac daily and Bupspirone   . Diabetes ([place])   . Diabetes mellitus without complication ([place])    takes Janumet daily  . Diverticulosis   . Diverticulosis   . Dyslipidemia    takes Crestor daily  . Fibromyalgia   . Hepatic steatosis 06/18/12   severe  . History of bronchitis    last time at least 20yrs ago  . Hyperlipidemia   . Hypothyroidism   . IBS (irritable bowel syndrome)   . Ischemic colitis ([place])   . Joint pain   . Joint swelling   . Lupus    tumid-skin  . Nephrolithiasis   . Numbness    in right foot  . Osteoarthritis    in hips  . Osteoarthritis cervical spine   . Osteoarthritis of hand    bilateral  . Restless leg syndrome    takes Requip nightly  . Sciatica   . Urinary frequency   . Urinary leakage   . Urinary urgency   . Urinary, incontinence, stress female   . Walking pneumonia    last time more than 30yrs ago   Past Surgical History:  Procedure Laterality Date  . ABDOMINAL HYSTERECTOMY    . APPENDECTOMY    . COLONOSCOPY    . D&C x 4    . DENTAL SURGERY Left 2 weeks ago    dental implant  . ESOPHAGOGASTRODUODENOSCOPY    . excision of tumors at right neck     angle of jaw '68, benign  . FLEXIBLE SIGMOIDOSCOPY N/A 06/21/2012   Procedure: FLEXIBLE SIGMOIDOSCOPY;  Surgeon: Jerene Bears, MD;  Location: [facility];  Service: Gastroenterology;  Laterality: N/A;  . TONSILLECTOMY    . TOTAL HIP ARTHROPLASTY Right 06/09/2013   Procedure: TOTAL HIP ARTHROPLASTY;  Surgeon: Kerin Salen, MD;  Location: [place];  Service: Orthopedics;  Laterality: Right;  . TUBAL LIGATION      reports that she has never smoked. She has never used smokeless tobacco. She reports that she does not drink alcohol or use drugs.  Allergies  Allergen Reactions  . Codeine Anxiety and  Other (See Comments)    hallucinations  . Macrolides And Ketolides Nausea And Vomiting    All mycins  . Procaine Hcl Palpitations  . Aspirin Nausea And Vomiting    Burns stomach  . Diflucan [Fluconazole]     Reactions with other medications  . Dilaudid [Hydromorphone Hcl]   . Morphine And Related     Really bad headaches and nightmares  . Sulfa Antibiotics Other (See Comments)   Family History  Problem Relation Age of Onset  . Drug abuse Mother   . Alcohol abuse Mother   . Alcohol abuse Father   . Colon cancer Paternal Uncle   . Diabetes Brother   . Alcohol abuse Brother     x 2   Prior to Admission medications   Medication Sig Start Date End Date Taking? Authorizing Provider  acetaminophen (TYLENOL) 500 MG tablet Take 500 mg by mouth every 6 (six) hours as needed. Takes 2 as needed   Yes [provider]  atorvastatin (LIPITOR) 40 MG tablet Take 40 mg by mouth daily. 02/26/16  Yes [provider]  BD PEN NEEDLE NANO U/F 32G X 4 MM MISC Use to inject insulin 5 times daily 05/29/16  Yes Elayne Snare, MD  cholecalciferol (VITAMIN D) 1000 units tablet Take 2,000 Units by mouth daily.    Yes [provider]  divalproex (DEPAKOTE ER) 250 MG 24 hr tablet Take 1,000 mg by mouth at bedtime.   Yes [provider]  donepezil (ARICEPT) 10 MG tablet Take 10 mg by mouth at bedtime. 05/26/16  Yes [provider]  escitalopram (LEXAPRO) 10 MG tablet Take 5 mg by mouth daily.    Yes [provider]  gabapentin (NEURONTIN) 100 MG capsule Take 1 capsule (100 mg total) by mouth 3 (three) times daily. Patient taking differently: Take 300 mg by mouth 2 (two) times daily.  07/06/15  Yes Charlynne Cousins, MD  glucose blood (BAYER CONTOUR NEXT TEST) test strip Use to test blood sugar 3 times daily Dx code- E11.42 05/04/16  Yes Elayne Snare, MD  insulin lispro (HUMALOG KWIKPEN) 100 UNIT/ML KiwkPen 6 units at breakfast and lunch and 8 units before supper  05/04/16  Yes Elayne Snare, MD  LANTUS SOLOSTAR 100 UNIT/ML Solostar Pen inject 10 units subcutaneously daily Patient taking differently: inject 24 units subcutaneously daily 06/09/16  Yes Elayne Snare, MD  levothyroxine (SYNTHROID, LEVOTHROID) 75 MCG tablet Take 1 tablet (75 mcg total) by mouth daily before breakfast. I tablet daily before breakfast 05/29/16  Yes Dwyane Dee,  Vicenta Aly, MD  lithium 300 MG tablet Take 600 mg by mouth every evening.    Yes [provider]  LORazepam (ATIVAN) 2 MG tablet Take 2 mg by mouth 4 (four) times daily. 1 to 2 mg 4 times daily 03/20/16  Yes [provider]  losartan (COZAAR) 50 MG tablet Take 50 mg by mouth daily. 03/12/16  Yes [provider]  memantine (NAMENDA) 5 MG tablet Take 10 mg by mouth every evening.    Yes [provider]  metFORMIN (GLUCOPHAGE-XR) 500 MG 24 hr tablet Take 3 tablets (1,500 mg total) by mouth daily with supper. Patient taking differently: Take 500 mg by mouth 2 (two) times daily.  05/29/16  Yes Elayne Snare, MD  QUEtiapine (SEROQUEL) 200 MG tablet Take 100 mg by mouth 2 (two) times daily. 06/06/16  Yes [provider]  rOPINIRole (REQUIP) 0.5 MG tablet Take 3 tablets (1.5 mg total) by mouth daily. Patient taking differently: Take 2 mg by mouth 2 (two) times daily. Take 2 tablets at 5 in the evening and take 4 tablets at bed time 07/06/15  Yes Charlynne Cousins, MD  VICTOZA 18 MG/3ML SOPN inject 1.2 milligram subcutaneously daily Patient taking differently: inject 1.8 milligram subcutaneously daily 07/08/16  Yes Elayne Snare, MD  phenazopyridine (PYRIDIUM) 200 MG tablet Take 1 tablet (200 mg total) by mouth 3 (three) times daily as needed for pain. Patient not taking: Reported on 05/29/2016 04/26/16   Clayton Bibles, [place]   Physical Exam: Vitals:   07/14/16 0837 07/14/16 0859 07/14/16 0901 07/14/16 1456  BP: 129/78 140/79  137/83  Pulse: 93 87  79  Resp: 16 16  17   Temp: 98 F (36.7 C) 98.1 F (36.7 C)      TempSrc: Oral Oral    SpO2: 97% 96%  96%  Weight:   77.6 kg (171 lb)   Height:   5\' 2"  (1.575 m)    Constitutional: NAD, appears anxious Eyes: lids and conjunctivae normal ENMT: Mucous membranes are moist. Posterior pharynx clear of any exudate or lesions. Neck: normal, supple, no masses, no thyromegaly Respiratory: clear to auscultation bilaterally, no wheezing, no crackles. Normal respiratory effort.  Cardiovascular: Regular rate and rhythm, no murmurs / rubs / gallops. No extremity edema. 2+ pedal pulses.  Abdomen: no tenderness, no masses palpated. Bowel sounds positive.  Musculoskeletal: no clubbing / cyanosis. Normal muscle tone.  Skin: no rashes, lesions, ulcers. No induration Neurologic: CN 2-12 grossly intact. Strength 5/5 in all 4.  Psychiatric: Normal judgment and insight. Alert and oriented x 3. Anxious mood.   Labs on Admission: I have personally reviewed following labs and imaging studies  CBC:  Recent Labs Lab 07/14/16 0916 07/14/16 1059  WBC 10.5  --   HGB 14.6 15.6*  HCT 45.2 46.0  MCV 94.2  --   PLT 229  --    Basic Metabolic Panel:  Recent Labs Lab 07/10/16 1108 07/14/16 0916 07/14/16 1059  NA 137 141 142  K 4.2 4.4 4.1  CL 101 105 105  CO2 28 26  --   GLUCOSE 129* 159* 133*  BUN 9 8 6   CREATININE 1.03 0.87 0.80  CALCIUM 10.7* 10.4*  --    GFR: Estimated Creatinine Clearance: 64.9 mL/min (by C-G formula based on SCr of 0.8 mg/dL). Liver Function Tests:  Recent Labs Lab 07/14/16 0916  AST 17  ALT 15  ALKPHOS 84  BILITOT 0.7  PROT 7.1  ALBUMIN 3.9    Recent Labs Lab  07/14/16 0916  LIPASE 27   No results for input(s): AMMONIA in the last 168 hours. Coagulation Profile: No results for input(s): INR, PROTIME in the last 168 hours. Cardiac Enzymes: No results for input(s): CKTOTAL, CKMB, CKMBINDEX, TROPONINI in the last 168 hours. BNP (last 3 results) No results for input(s): PROBNP in the last 8760 hours. HbA1C: No results for  input(s): HGBA1C in the last 72 hours. CBG:  Recent Labs Lab 07/14/16 1157  GLUCAP 185*   Lipid Profile: No results for input(s): CHOL, HDL, LDLCALC, TRIG, CHOLHDL, LDLDIRECT in the last 72 hours. Thyroid Function Tests: No results for input(s): TSH, T4TOTAL, FREET4, T3FREE, THYROIDAB in the last 72 hours. Anemia Panel: No results for input(s): VITAMINB12, FOLATE, FERRITIN, TIBC, IRON, RETICCTPCT in the last 72 hours. Urine analysis:    Component Value Date/Time   COLORURINE YELLOW 07/14/2016 [place] 07/14/2016 1203   LABSPEC 1.025 07/14/2016 1203   PHURINE 6.0 07/14/2016 1203   GLUCOSEU NEGATIVE 07/14/2016 1203   GLUCOSEU Negative 09/28/2011 1634   HGBUR NEGATIVE 07/14/2016 [place] 07/14/2016 1203   BILIRUBINUR neg 09/18/2011 [place] 07/14/2016 [place] 07/14/2016 1203   UROBILINOGEN 0.2 12/30/2014 2000   NITRITE NEGATIVE 07/14/2016 1203   LEUKOCYTESUR TRACE (A) 07/14/2016 1203   Radiological Exams on Admission: Ct Abdomen Pelvis W Contrast  Result Date: 07/14/2016 CLINICAL DATA:  Right-sided abdominal pain and diarrhea. History of colitis, chronic right-sided UPJ obstruction/stricture and prior hysterectomy. EXAM: CT ABDOMEN AND PELVIS WITH CONTRAST TECHNIQUE: Multidetector CT imaging of the abdomen and pelvis was performed using the standard protocol following bolus administration of intravenous contrast. CONTRAST:  100 mL ISOVUE-300 IOPAMIDOL (ISOVUE-300) INJECTION 61% COMPARISON:  CT of the abdomen and pelvis without contrast on 04/26/2016 and CTA of the abdomen and pelvis on 10/28/2015 FINDINGS: Lower chest: No acute abnormality. Hepatobiliary: The liver demonstrates mild steatosis. No hepatic masses or biliary dilatation. The gallbladder appears normal. Pancreas: Unremarkable. No pancreatic ductal dilatation or surrounding inflammatory changes. Spleen: Normal in size without focal abnormality.  Adrenals/Urinary Tract: Adrenal glands appear normal. The kidneys have a stable appearance with stable mildly dilated right renal pelvis and renal collecting system without delayed excretion of contrast. Findings are consistent with chronic UPJ stenosis. No calculi or renal masses identified. Stomach/Bowel: Bowel shows no evidence of obstruction or inflammation. Stable diverticulosis of the sigmoid colon without evidence of diverticulitis. Vascular/Lymphatic: No significant vascular findings are present. No enlarged abdominal or pelvic lymph nodes. Reproductive: Status post hysterectomy. No adnexal masses. Other: No abdominal wall hernia or abnormality. No abdominopelvic ascites or focal abscess identified. Musculoskeletal: No acute or significant osseous findings. IMPRESSION: No acute findings. Stable hepatic steatosis, right renal UPJ stenosis and sigmoid colonic diverticulosis. Electronically Signed   By: Aletta Edouard M.D.   On: 07/14/2016 11:47    Assessment/Plan Active Problems:   Hyperlipidemia with target low density lipoprotein (LDL) cholesterol less than 100 mg/dL   DEPRESSION/ANXIETY   ABUSE, ALCOHOL, IN REMISSION   Fibromyalgia   Diabetes mellitus with diabetic neuropathy, without long-term current use of insulin ([name])   Hypertension   Bipolar 1 disorder ([name])   IBS (irritable bowel syndrome)   Diabetic polyneuropathy associated with type 2 diabetes mellitus ([name])   Restless leg syndrome   Colitis   Abdominal pain, nausea and diarrhea with concern for IBS versus ischemic colitis -CT scan of the abdomen and pelvis in the ED was negative for acute findings.  GI  was wondering as an outpatient if this is ischemic colitis, they have been consulted, appreciate input, possible colonoscopy this admission -For now keep n.p.o., provide IV fluids and symptom control -We will rule out C. difficile  Hypothyroidism  -Patient currently on Synthroid 75, she tells me that her PCP increased from  50 about 6 weeks ago.  Repeat TSH done 4 days ago showed decreased TSH of 0.05, will decrease the dose back to 50.  She also tells me that she is taking her Synthroid with all her medications in the morning and not separate. -We will not check a free T3 and free T4 since they can vary with the p.o. intake of the Synthroid   Diabetes mellitus -Resume home Lantus, place on sliding scale, hold home oral agents  Depression -Resume home medications -Patient is on Aricept as well as Namenda, no mention of diagnosis for dementia, she is alert and oriented 4 -Resume Seroquel  Bipolar disorder -Resume her lithium, check a lithium level  Restless leg syndrome -Resume Requip  DVT prophylaxis: SCDs Code Status: Full code Family Communication: Husband at bedside Disposition Plan: Admit to [place], expect home when ready Consults called: Gastroenterology  Admission status: Observation  At the point of initial evaluation, it is my clinical opinion that admission for OBSERVATION is reasonable and necessary because the patient's presenting complaints in the context of their chronic conditions represent sufficient risk of deterioration or significant morbidity to constitute reasonable grounds for close observation in the hospital setting, but that the patient may be medically stable for discharge from the hospital within 24 to 48 hours.    Marzetta Board, MD [facility] Pager [phone]  If 7PM-7AM, please contact night-coverage [email or web address] Password [email or web address]  07/14/2016, 4:16 PM

## 2016-07-15 ENCOUNTER — Encounter (HOSPITAL_COMMUNITY): Payer: Self-pay

## 2016-07-15 DIAGNOSIS — F1011 Alcohol abuse, in remission: Secondary | ICD-10-CM

## 2016-07-15 DIAGNOSIS — M19042 Primary osteoarthritis, left hand: Secondary | ICD-10-CM | POA: Diagnosis present

## 2016-07-15 DIAGNOSIS — Z885 Allergy status to narcotic agent status: Secondary | ICD-10-CM | POA: Diagnosis not present

## 2016-07-15 DIAGNOSIS — Z7984 Long term (current) use of oral hypoglycemic drugs: Secondary | ICD-10-CM | POA: Diagnosis not present

## 2016-07-15 DIAGNOSIS — F419 Anxiety disorder, unspecified: Secondary | ICD-10-CM | POA: Diagnosis present

## 2016-07-15 DIAGNOSIS — K591 Functional diarrhea: Secondary | ICD-10-CM | POA: Diagnosis not present

## 2016-07-15 DIAGNOSIS — Z880 Allergy status to penicillin: Secondary | ICD-10-CM | POA: Diagnosis not present

## 2016-07-15 DIAGNOSIS — F0390 Unspecified dementia without behavioral disturbance: Secondary | ICD-10-CM | POA: Diagnosis present

## 2016-07-15 DIAGNOSIS — Z881 Allergy status to other antibiotic agents status: Secondary | ICD-10-CM | POA: Diagnosis not present

## 2016-07-15 DIAGNOSIS — E1142 Type 2 diabetes mellitus with diabetic polyneuropathy: Secondary | ICD-10-CM | POA: Diagnosis present

## 2016-07-15 DIAGNOSIS — K580 Irritable bowel syndrome with diarrhea: Secondary | ICD-10-CM | POA: Diagnosis not present

## 2016-07-15 DIAGNOSIS — H9193 Unspecified hearing loss, bilateral: Secondary | ICD-10-CM | POA: Diagnosis present

## 2016-07-15 DIAGNOSIS — Z886 Allergy status to analgesic agent status: Secondary | ICD-10-CM | POA: Diagnosis not present

## 2016-07-15 DIAGNOSIS — M19041 Primary osteoarthritis, right hand: Secondary | ICD-10-CM | POA: Diagnosis present

## 2016-07-15 DIAGNOSIS — Z888 Allergy status to other drugs, medicaments and biological substances status: Secondary | ICD-10-CM | POA: Diagnosis not present

## 2016-07-15 DIAGNOSIS — I10 Essential (primary) hypertension: Secondary | ICD-10-CM | POA: Diagnosis present

## 2016-07-15 DIAGNOSIS — K589 Irritable bowel syndrome without diarrhea: Secondary | ICD-10-CM | POA: Diagnosis present

## 2016-07-15 DIAGNOSIS — K5730 Diverticulosis of large intestine without perforation or abscess without bleeding: Secondary | ICD-10-CM | POA: Diagnosis present

## 2016-07-15 DIAGNOSIS — E785 Hyperlipidemia, unspecified: Secondary | ICD-10-CM | POA: Diagnosis present

## 2016-07-15 DIAGNOSIS — Z90710 Acquired absence of both cervix and uterus: Secondary | ICD-10-CM | POA: Diagnosis not present

## 2016-07-15 DIAGNOSIS — M797 Fibromyalgia: Secondary | ICD-10-CM | POA: Diagnosis present

## 2016-07-15 DIAGNOSIS — Z79899 Other long term (current) drug therapy: Secondary | ICD-10-CM | POA: Diagnosis not present

## 2016-07-15 DIAGNOSIS — Z96641 Presence of right artificial hip joint: Secondary | ICD-10-CM | POA: Diagnosis present

## 2016-07-15 DIAGNOSIS — F319 Bipolar disorder, unspecified: Secondary | ICD-10-CM | POA: Diagnosis present

## 2016-07-15 DIAGNOSIS — Z794 Long term (current) use of insulin: Secondary | ICD-10-CM | POA: Diagnosis not present

## 2016-07-15 DIAGNOSIS — E039 Hypothyroidism, unspecified: Secondary | ICD-10-CM | POA: Diagnosis present

## 2016-07-15 DIAGNOSIS — G2581 Restless legs syndrome: Secondary | ICD-10-CM | POA: Diagnosis present

## 2016-07-15 LAB — CBC
HCT: 39 % (ref 36.0–46.0)
Hemoglobin: 12.3 g/dL (ref 12.0–15.0)
MCH: 29.7 pg (ref 26.0–34.0)
MCHC: 31.5 g/dL (ref 30.0–36.0)
MCV: 94.2 fL (ref 78.0–100.0)
Platelets: 181 10*3/uL (ref 150–400)
RBC: 4.14 MIL/uL (ref 3.87–5.11)
RDW: 12.6 % (ref 11.5–15.5)
WBC: 7.5 10*3/uL (ref 4.0–10.5)

## 2016-07-15 LAB — COMPREHENSIVE METABOLIC PANEL
ALT: 10 U/L — ABNORMAL LOW (ref 14–54)
AST: 15 U/L (ref 15–41)
Albumin: 3 g/dL — ABNORMAL LOW (ref 3.5–5.0)
Alkaline Phosphatase: 62 U/L (ref 38–126)
Anion gap: 8 (ref 5–15)
BUN: 7 mg/dL (ref 6–20)
CO2: 23 mmol/L (ref 22–32)
Calcium: 9.1 mg/dL (ref 8.9–10.3)
Chloride: 111 mmol/L (ref 101–111)
Creatinine, Ser: 0.67 mg/dL (ref 0.44–1.00)
GFR calc Af Amer: >60 mL/min (ref >60)
GFR calc non Af Amer: >60 mL/min (ref >60)
Glucose, Bld: 92 mg/dL (ref 65–99)
Potassium: 4.1 mmol/L (ref 3.5–5.1)
Sodium: 142 mmol/L (ref 135–145)
Total Bilirubin: 0.4 mg/dL (ref 0.3–1.2)
Total Protein: 5.6 g/dL — ABNORMAL LOW (ref 6.5–8.1)

## 2016-07-15 LAB — C DIFFICILE QUICK SCREEN W PCR REFLEX
C Diff antigen: NEGATIVE
C Diff interpretation: NOT DETECTED
C Diff toxin: NEGATIVE

## 2016-07-15 LAB — GLUCOSE, CAPILLARY
Glucose-Capillary: 92 mg/dL (ref 65–99)
Glucose-Capillary: 97 mg/dL (ref 65–99)
Glucose-Capillary: 106 mg/dL — ABNORMAL HIGH (ref 65–99)
Glucose-Capillary: 130 mg/dL — ABNORMAL HIGH (ref 65–99)

## 2016-07-15 MED ORDER — INSULIN GLARGINE 100 UNIT/ML ~~LOC~~ SOLN
10.0000 [IU] | Freq: Every day | SUBCUTANEOUS | Status: DC
  Filled 2016-07-15: qty 0.1

## 2016-07-15 MED ORDER — LORAZEPAM 1 MG PO TABS
1.0000 mg | ORAL_TABLET | Freq: Four times a day (QID) | ORAL | Status: DC
  Administered 2016-07-15 – 2016-07-16 (×6): 1 mg via ORAL
  Filled 2016-07-15 (×6): qty 1

## 2016-07-15 NOTE — Progress Notes (Signed)
[facility] Progress Note  Chief Complaint:   Diarrhea   Subjective: *NO diarrhea since admission which she believes is because of no PO intake  Objective:  Vital signs in last 24 hours: Temp:  [97.6 F (36.4 C)-98.4 F (36.9 C)] 97.7 F (36.5 C) (05/09 0453) Pulse Rate:  [79-86] 86 (05/09 0453) Resp:  [16-18] 18 (05/09 0453) BP: (124-137)/(53-83) 126/61 (05/09 0453) SpO2:  [95 %-100 %] 98 % (05/09 0453) Weight:  [173 lb (78.5 kg)] 173 lb (78.5 kg) (05/08 1811) Last BM Date: 07/14/16 General:   Alert, white female in NAD EENT:  Normal hearing, non icteric sclera, conjunctive pink.  Heart:  Regular rate and rhythm, no lower extremity edema Pulm: Normal respiratory effort, lungs Abdomen:  Soft, nondistended, nontender.  Normal bowel sounds, no masses felt. No hepatomegaly.    Neurologic:  Alert and  oriented x4;  grossly normal neurologically. Psych:  Alert and cooperative. Normal mood and affect.   Intake/Output from previous day: 05/08 0701 - 05/09 0700 In: 1862.5 [I.V.:862.5; IV Piggyback:1000] Out: -  Intake/Output this shift: No intake/output data recorded.  Lab Results:  Recent Labs  07/14/16 0916 07/14/16 1059 07/15/16 0512  WBC 10.5  --  7.5  HGB 14.6 15.6* 12.3  HCT 45.2 46.0 39.0  PLT 229  --  181   BMET  Recent Labs  07/14/16 0916 07/14/16 1059 07/15/16 0512  NA 141 142 142  K 4.4 4.1 4.1  CL 105 105 111  CO2 26  --  23  GLUCOSE 159* 133* 92  BUN 8 6 7   CREATININE 0.87 0.80 0.67  CALCIUM 10.4*  --  9.1   LFT  Recent Labs  07/15/16 0512  PROT 5.6*  ALBUMIN 3.0*  AST 15  ALT 10*  ALKPHOS 62  BILITOT 0.4   Ct Abdomen Pelvis W Contrast  Result Date: 07/14/2016 CLINICAL DATA:  Right-sided abdominal pain and diarrhea. History of colitis, chronic right-sided UPJ obstruction/stricture and prior hysterectomy. EXAM: CT ABDOMEN AND PELVIS WITH CONTRAST TECHNIQUE: Multidetector CT imaging of the abdomen and pelvis was  performed using the standard protocol following bolus administration of intravenous contrast. CONTRAST:  100 mL ISOVUE-300 IOPAMIDOL (ISOVUE-300) INJECTION 61% COMPARISON:  CT of the abdomen and pelvis without contrast on 04/26/2016 and CTA of the abdomen and pelvis on 10/28/2015 FINDINGS: Lower chest: No acute abnormality. Hepatobiliary: The liver demonstrates mild steatosis. No hepatic masses or biliary dilatation. The gallbladder appears normal. Pancreas: Unremarkable. No pancreatic ductal dilatation or surrounding inflammatory changes. Spleen: Normal in size without focal abnormality. Adrenals/Urinary Tract: Adrenal glands appear normal. The kidneys have a stable appearance with stable mildly dilated right renal pelvis and renal collecting system without delayed excretion of contrast. Findings are consistent with chronic UPJ stenosis. No calculi or renal masses identified. Stomach/Bowel: Bowel shows no evidence of obstruction or inflammation. Stable diverticulosis of the sigmoid colon without evidence of diverticulitis. Vascular/Lymphatic: No significant vascular findings are present. No enlarged abdominal or pelvic lymph nodes. Reproductive: Status post hysterectomy. No adnexal masses. Other: No abdominal wall hernia or abnormality. No abdominopelvic ascites or focal abscess identified. Musculoskeletal: No acute or significant osseous findings. IMPRESSION: No acute findings. Stable hepatic steatosis, right renal UPJ stenosis and sigmoid colonic diverticulosis. Electronically Signed   By: Aletta Edouard M.D.   On: 07/14/2016 11:47    Assessment / Plan:  71 yo female admitted with lower abdominal discomfort and worsening of chronic diarrhea. She has a hx of recurrent ischemic colitis but  clinical presentation nor CTscan suggests recurrent ischemic colitis.  -will obtain labs for celiac disease.  -May need to try holding metformin for a while to see if diarrhea improves. Additionally Aricept can cause  diarrhea -she hasn't had any stools since admission so couldn't get stool sample yet -try clear and advance diet as tolerated    Active Problems:   Hyperlipidemia with target low density lipoprotein (LDL) cholesterol less than 100 mg/dL   DEPRESSION/ANXIETY   ABUSE, ALCOHOL, IN REMISSION   Fibromyalgia   Diabetes mellitus with diabetic neuropathy, without long-term current use of insulin ([name])   Hypertension   Bipolar 1 disorder ([name])   IBS (irritable bowel syndrome)   Diabetic polyneuropathy associated with type 2 diabetes mellitus ([name])   Restless leg syndrome   Colitis   Hypothyroidism   Diarrhea   Generalized abdominal pain   LOS: 0 days   Tye Savoy NP 07/15/2016, 9:36 AM  Pager number [phone]  GI ATTENDING  Interval history data reviewed. Patient personally seen and examined. Agree with interval progress note as outlined. Patient has had no further diarrhea. No abdominal pain. Suspect her complaints are functional. Celiac testing underway. No evidence for infection (unable to provide stool specimen!). Advance diet as tolerated. Celiac testing pending. Anticipate discharge home tomorrow and outpatient GI follow-up with Dr. Loletha Carrow. Please call for questions. Discussed with patient. Will sign off.  Docia Chuck. Geri [place]., M.D. [facility] Division of Gastroenterology

## 2016-07-15 NOTE — Progress Notes (Signed)
PROGRESS NOTE    [name] [name]  FKC:[id number] DOB: 06-28-1945 DOA: 07/14/2016 PCP: London Pepper, MD  Outpatient Specialists:     Brief Narrative:  80 female Dm ty II on insulin Bipolar Fibromyalgia ibs Hypothyroid R THR 06/2013 Prior colonoscopy 02/2014 =diverticulosis   Admitted 07/15/16 ~ 3 weeks n/v/d and abd pain Felt potentitally in the past to be related to possible isch colitis GI consulted    Assessment & Plan:   Active Problems:   Hyperlipidemia with target low density lipoprotein (LDL) cholesterol less than 100 mg/dL   DEPRESSION/ANXIETY   ABUSE, ALCOHOL, IN REMISSION   Fibromyalgia   Diabetes mellitus with diabetic neuropathy, without long-term current use of insulin ([name])   Hypertension   Bipolar 1 disorder ([name])   IBS (irritable bowel syndrome)   Diabetic polyneuropathy associated with type 2 diabetes mellitus ([name])   Restless leg syndrome   Colitis   Hypothyroidism   Diarrhea   Generalized abdominal pain   n/v/d-neg colonoscopies in the past-no microscopic colitis Probably plain IBS with exacerbation from recently start [6 wk pta] metformin Holding metformin.  Keep aricept as no further diarr--almost all meds have either rn/v/d as a side effect.  As OP will need meds downward titrated example-depakote has a much higher incidence of these side effects Liquid diet as per gi--further plan as O as per GI Follow cdiff and gi pat panel-suspect will be neg  DM ty II + polyneuropathy Hold metformin Consider sulphonylurea in am lantus 10 daily as slight low sugars overnight 60-80 Cont gabapentin 300 bid  Hypothyroid Cont synthroud 50 mcg  htn cont losartan 50 daily  Bipolar See psychiatry regularly-Dr. Kennyth Lose Adjustment of meds as OP--note is on high doses of Ativan 2 mg q6-starte di mg q 6 to avoid oversedation Cont lexapro  5, seroquel 100 bid, lithium 600 qd [lithium level borderline low nl 0.57], depakote  1000 mg hs  Dementia Needs OP  MMSE Cont aricept 10 namenda 10    lovenox D/w husband at bedside OBs Expect will need 24 more hours to detemrien if she can tolerate diet without further issue  Consultants:    gi  Procedures:   none  Antimicrobials:   none    Subjective: Alert itred no n/v/d States 3 week h/o continued loose stool~ 3 x/d Watery unformed Not foul nop recent abx  Objective: Vitals:   07/14/16 1724 07/14/16 1811 07/14/16 2100 07/15/16 0453  BP: (!) 124/53 (!) 126/59 130/68 126/61  Pulse: 82 79 83 86  Resp: 18 18 16 18   Temp:  97.6 F (36.4 C) 98.4 F (36.9 C) 97.7 F (36.5 C)  TempSrc:  Oral Oral Oral  SpO2: 95% 100% 97% 98%  Weight:  78.5 kg (173 lb)    Height:  5\' 2"  (1.575 m)      Intake/Output Summary (Last 24 hours) at 07/15/16 0838 Last data filed at 07/15/16 0600  Gross per 24 hour  Intake           1862.5 ml  Output                0 ml  Net           1862.5 ml   Filed Weights   07/14/16 0901 07/14/16 1811  Weight: 77.6 kg (171 lb) 78.5 kg (173 lb)    Examination:  Alert oriented in nad s1 s2 no m/r/g Clear no added sound, no rales abd soft nt no rebound no organomegally-slightly distended  Neuro intact  flat affect moves 4 limbs = Skin benign no rash no swelling  Data Reviewed: I have personally reviewed following labs and imaging studies  CBC:  Recent Labs Lab 07/14/16 0916 07/14/16 1059 07/15/16 0512  WBC 10.5  --  7.5  HGB 14.6 15.6* 12.3  HCT 45.2 46.0 39.0  MCV 94.2  --  94.2  PLT 229  --  841   Basic Metabolic Panel:  Recent Labs Lab 07/10/16 1108 07/14/16 0916 07/14/16 1059 07/15/16 0512  NA 137 141 142 142  K 4.2 4.4 4.1 4.1  CL 101 105 105 111  CO2 28 26  --  23  GLUCOSE 129* 159* 133* 92  BUN 9 8 6 7   CREATININE 1.03 0.87 0.80 0.67  CALCIUM 10.7* 10.4*  --  9.1   GFR: Estimated Creatinine Clearance: 65.3 mL/min (by C-G formula based on SCr of 0.67 mg/dL). Liver Function Tests:  Recent Labs Lab 07/14/16 0916  07/15/16 0512  AST 17 15  ALT 15 10*  ALKPHOS 84 62  BILITOT 0.7 0.4  PROT 7.1 5.6*  ALBUMIN 3.9 3.0*    Recent Labs Lab 07/14/16 0916  LIPASE 27   No results for input(s): AMMONIA in the last 168 hours. Coagulation Profile: No results for input(s): INR, PROTIME in the last 168 hours. Cardiac Enzymes: No results for input(s): CKTOTAL, CKMB, CKMBINDEX, TROPONINI in the last 168 hours. BNP (last 3 results) No results for input(s): PROBNP in the last 8760 hours. HbA1C: No results for input(s): HGBA1C in the last 72 hours. CBG:  Recent Labs Lab 07/14/16 1157 07/14/16 2205 07/15/16 0737  GLUCAP 185* 87 92   Lipid Profile: No results for input(s): CHOL, HDL, LDLCALC, TRIG, CHOLHDL, LDLDIRECT in the last 72 hours. Thyroid Function Tests: No results for input(s): TSH, T4TOTAL, FREET4, T3FREE, THYROIDAB in the last 72 hours. Anemia Panel: No results for input(s): VITAMINB12, FOLATE, FERRITIN, TIBC, IRON, RETICCTPCT in the last 72 hours. Urine analysis:    Component Value Date/Time   COLORURINE YELLOW 07/14/2016 [place] 07/14/2016 1203   LABSPEC 1.025 07/14/2016 1203   PHURINE 6.0 07/14/2016 1203   GLUCOSEU NEGATIVE 07/14/2016 1203   GLUCOSEU Negative 09/28/2011 1634   HGBUR NEGATIVE 07/14/2016 [place] 07/14/2016 1203   BILIRUBINUR neg 09/18/2011 [place] 07/14/2016 [place] 07/14/2016 1203   UROBILINOGEN 0.2 12/30/2014 2000   NITRITE NEGATIVE 07/14/2016 1203   LEUKOCYTESUR TRACE (A) 07/14/2016 1203   Sepsis Labs: @LABRCNTIP (procalcitonin:4,lacticidven:4)  )No results found for this or any previous visit (from the past 240 hour(s)).       Radiology Studies: Ct Abdomen Pelvis W Contrast  Result Date: 07/14/2016 CLINICAL DATA:  Right-sided abdominal pain and diarrhea. History of colitis, chronic right-sided UPJ obstruction/stricture and prior hysterectomy. EXAM: CT ABDOMEN AND PELVIS WITH  CONTRAST TECHNIQUE: Multidetector CT imaging of the abdomen and pelvis was performed using the standard protocol following bolus administration of intravenous contrast. CONTRAST:  100 mL ISOVUE-300 IOPAMIDOL (ISOVUE-300) INJECTION 61% COMPARISON:  CT of the abdomen and pelvis without contrast on 04/26/2016 and CTA of the abdomen and pelvis on 10/28/2015 FINDINGS: Lower chest: No acute abnormality. Hepatobiliary: The liver demonstrates mild steatosis. No hepatic masses or biliary dilatation. The gallbladder appears normal. Pancreas: Unremarkable. No pancreatic ductal dilatation or surrounding inflammatory changes. Spleen: Normal in size without focal abnormality. Adrenals/Urinary Tract: Adrenal glands appear normal. The kidneys have a stable appearance with stable mildly dilated right renal pelvis and  renal collecting system without delayed excretion of contrast. Findings are consistent with chronic UPJ stenosis. No calculi or renal masses identified. Stomach/Bowel: Bowel shows no evidence of obstruction or inflammation. Stable diverticulosis of the sigmoid colon without evidence of diverticulitis. Vascular/Lymphatic: No significant vascular findings are present. No enlarged abdominal or pelvic lymph nodes. Reproductive: Status post hysterectomy. No adnexal masses. Other: No abdominal wall hernia or abnormality. No abdominopelvic ascites or focal abscess identified. Musculoskeletal: No acute or significant osseous findings. IMPRESSION: No acute findings. Stable hepatic steatosis, right renal UPJ stenosis and sigmoid colonic diverticulosis. Electronically Signed   By: Aletta Edouard M.D.   On: 07/14/2016 11:47    Scheduled Meds: . atorvastatin  40 mg Oral Daily  . divalproex  1,000 mg Oral QHS  . donepezil  10 mg Oral QHS  . escitalopram  5 mg Oral Daily  . gabapentin  300 mg Oral BID  . insulin aspart  0-9 Units Subcutaneous TID WC  . insulin glargine  15 Units Subcutaneous Q2200  . levothyroxine  50 mcg  Oral QAC breakfast  . lithium carbonate  600 mg Oral QHS  . losartan  50 mg Oral Daily  . memantine  10 mg Oral QPM  . QUEtiapine  100 mg Oral BID  . rOPINIRole  1 mg Oral Daily   And  . rOPINIRole  2 mg Oral QHS   Continuous Infusions: . sodium chloride 75 mL/hr at 07/15/16 0638     LOS: 0 days    Time spent: [place], MD [facility] ([phone]   If 7PM-7AM, please contact night-coverage [email or web address] Password [facility] 07/15/2016, 8:38 AM

## 2016-07-16 LAB — GASTROINTESTINAL PANEL BY PCR, STOOL (REPLACES STOOL CULTURE)

## 2016-07-16 LAB — GLUCOSE, CAPILLARY
Glucose-Capillary: 103 mg/dL — ABNORMAL HIGH (ref 65–99)
Glucose-Capillary: 117 mg/dL — ABNORMAL HIGH (ref 65–99)
Glucose-Capillary: 183 mg/dL — ABNORMAL HIGH (ref 65–99)

## 2016-07-16 MED ORDER — LORAZEPAM 2 MG/ML IJ SOLN: 2.0000 mg | Freq: Every day | INTRAMUSCULAR | Status: DC

## 2016-07-16 MED ORDER — INSULIN GLARGINE 100 UNIT/ML ~~LOC~~ SOLN
10.0000 [IU] | Freq: Every day | SUBCUTANEOUS | Status: DC
  Administered 2016-07-16: 10 [IU] via SUBCUTANEOUS
  Filled 2016-07-16: qty 0.1

## 2016-07-16 MED ORDER — LOPERAMIDE HCL 2 MG PO CAPS: 2.0000 mg | ORAL_CAPSULE | Freq: Four times a day (QID) | ORAL | 0 refills | Status: DC | PRN

## 2016-07-16 NOTE — Discharge Summary (Signed)
Physician Discharge Summary  [name] [name] YBO:[id number] DOB: [birth year] DOA: 07/14/2016  PCP: London Pepper, MD  Admit date: 07/14/2016 Discharge date: 07/16/2016  Time spent: 35 minutes  Recommendations for Outpatient Follow-up:  1. Follow GI pathogen panel as outpatient however unlikely to be infectious diarrhea given lack of systemic findings 2. Will need outpatient follow-up with gastroenterology for IBS-diarrhea predominant 3. Will need to desist from alcohol 4. Recommend downward titration of psychiatric meds if patient's diarrhea does not slow down and I have encouraged her to take regular scheduled Imodium for now and would not uptitrate the same 5. Would recommend specialized testing with fecal fat studies and other studies in the outpatient setting if no resolution  Discharge Diagnoses:  Active Problems:   Hyperlipidemia with target low density lipoprotein (LDL) cholesterol less than 100 mg/dL   DEPRESSION/ANXIETY   ABUSE, ALCOHOL, IN REMISSION   Fibromyalgia   Diabetes mellitus with diabetic neuropathy, without long-term current use of insulin ([name])   Hypertension   Bipolar 1 disorder ([name])   IBS (irritable bowel syndrome)   Diabetic polyneuropathy associated with type 2 diabetes mellitus ([name])   Restless leg syndrome   Colitis   Hypothyroidism   Diarrhea   Generalized abdominal pain   Discharge Condition: improved slightly  Diet recommendation:  Low residue and soft  Filed Weights   07/14/16 0901 07/14/16 1811  Weight: 77.6 kg (171 lb) 78.5 kg (173 lb)    History of present illness:  40 female Dm ty II on insulin Bipolar Fibromyalgia ibs Hypothyroid R THR 06/2013 Prior colonoscopy 02/2014 =diverticulosis   Admitted 07/15/16 ~ 3 weeks n/v/d and abd pain Felt potentitally in the past to be related to possible isch colitis GI consulted Who felt that the patient did not have ischemic colitis based on follow-up    Hospital Course:   n/v/d-neg  colonoscopies in the past-no microscopic colitis Probably plain IBS with exacerbation from recently start [6 wk pta] metformin Holding metformin.  Keep aricept as no further diarr--almost all meds have either rn/v/d as a side effect.  As OP will need meds downward titrated example-depakote has a much higher incidence of these side effects Liquid diet as per gi--further plan as Op as per GI Follow cdiff and gi pat panel-both were negative on discharge  DM ty II + polyneuropathy Hold metformin ongoing Consider sulphonylurea -we did not start this in the hospital given her significant polypharmacy and lack of hospitalist follow up in outpatient setting therefore this can be arranged per her outpatient physician Patient did have slight low sugars overnight 60-80 but these resolved on downward titration of Lantus to 10 units on discharge Cont gabapentin 300 bid  Hypothyroid Cont synthroid 50 mcg  htn cont losartan 50 daily  Bipolar See psychiatry regularly-Dr. Kennyth Lose Adjustment of meds as OP--note is on high doses of Ativan 2 mg q6-starte di mg q 6 to avoid oversedation Cont lexapro  5, seroquel 100 bid, lithium 600 qd [lithium level borderline low nl 0.57], depakote  1000 mg hs Will need outpatient evaluation regarding lowering meds by this doctor  Dementia Needs OP MMSE Cont aricept 10 namenda 10   Consultations:  Gastroenterology  Discharge Exam: Vitals:   07/16/16 0450 07/16/16 1428  BP: 120/65 127/68  Pulse: 82 87  Resp: 16 16  Temp: 98.2 F (36.8 C) 99 F (37.2 C)    General: Alert pleasant oriented still having diarrhea mild nausea no vomiting Cardiovascular: S1-S2 no murmur rub or gallop Respiratory: Clinically clear no  added sound Abdomen soft nontender no rebound no guarding  Discharge Instructions   Discharge Instructions    Diet - low sodium heart healthy    Complete by:  As directed    Discharge instructions    Complete by:  As directed    Please  take 1 tablet of Imodium scheduled around-the-clock for your diarrhea We can adjust her medications as an outpatient regarding what may be causing her diarrhea. I think it may be a combination of his psychiatric medications and they can be downward adjusted but I would hesitate to do so without the supervision of the treating psychiatrist to nausea best so I would encourage her to follow-up with them If this persists I would encourage you to follow-up with gastroenterology and get further testing done however there is nothing differently that we would need to do in the hospital from a gastroenterology aspect of as per Dr. Henrene Pastor   Increase activity slowly    Complete by:  As directed      Current Discharge Medication List    START taking these medications   Details  loperamide (IMODIUM) 2 MG capsule Take 1 capsule (2 mg total) by mouth 4 (four) times daily as needed for diarrhea or loose stools. Qty: 30 capsule, Refills: 0      CONTINUE these medications which have NOT CHANGED   Details  acetaminophen (TYLENOL) 500 MG tablet Take 500-1,000 mg by mouth every 6 (six) hours as needed for mild pain, moderate pain, fever or headache.     atorvastatin (LIPITOR) 40 MG tablet Take 40 mg by mouth at bedtime.  Refills: 0    cholecalciferol (VITAMIN D) 1000 units tablet Take 2,000 Units by mouth daily.     divalproex (DEPAKOTE ER) 250 MG 24 hr tablet Take 1,000 mg by mouth at bedtime.    donepezil (ARICEPT) 10 MG tablet Take 10 mg by mouth at bedtime. Refills: 0    escitalopram (LEXAPRO) 10 MG tablet Take 10 mg by mouth daily.     gabapentin (NEURONTIN) 100 MG capsule Take 300 mg by mouth 2 (two) times daily.    HYDROcodone-acetaminophen (NORCO/VICODIN) 5-325 MG tablet Take 1 tablet by mouth every 6 (six) hours as needed for moderate pain.    insulin glargine (LANTUS) 100 unit/mL SOPN Inject 10 Units into the skin daily.    insulin lispro (HUMALOG KWIKPEN) 100 UNIT/ML KiwkPen Inject 6-8  Units into the skin 3 (three) times daily. Pt uses 6 units before breakfast and lunch and 8 units before dinner.    levothyroxine (SYNTHROID, LEVOTHROID) 75 MCG tablet Take 1 tablet (75 mcg total) by mouth daily before breakfast. I tablet daily before breakfast Qty: 90 tablet, Refills: 30    liothyronine (CYTOMEL) 25 MCG tablet Take 25 mcg by mouth daily.    lithium 300 MG tablet Take 600 mg by mouth at bedtime.     LORazepam (ATIVAN) 2 MG tablet Take 1-2 mg by mouth 4 (four) times daily as needed for anxiety.  Refills: 0    losartan (COZAAR) 50 MG tablet Take 50 mg by mouth daily. Refills: 0    memantine (NAMENDA) 10 MG tablet Take 10 mg by mouth at bedtime.    QUEtiapine (SEROQUEL) 200 MG tablet Take 200 mg by mouth at bedtime.  Refills: 0    rOPINIRole (REQUIP) 0.5 MG tablet Take 3.5 mg by mouth at bedtime.    VICTOZA 18 MG/3ML SOPN inject 1.2 milligram subcutaneously daily Qty: 6 mL, Refills: 2  STOP taking these medications     metFORMIN (GLUCOPHAGE-XR) 500 MG 24 hr tablet        Allergies  Allergen Reactions  . Codeine Anxiety and Other (See Comments)    Reaction:  Hallucinations  . Macrolides And Ketolides Nausea And Vomiting  . Procaine Hcl Palpitations  . Aspirin Nausea And Vomiting    Reaction:  Burns pts stomach   . Diflucan [Fluconazole] Other (See Comments)    Reaction:  Unknown   . Dilaudid [Hydromorphone Hcl] Other (See Comments)    Reaction:  Migraines and nightmares   . Morphine And Related Other (See Comments)    Reaction:  Migraines and nightmares   . Other Nausea And Vomiting and Other (See Comments)    Pt states that all -mycins cause N/V.   Marland Kitchen Sulfa Antibiotics Other (See Comments)    Reaction:  Unknown       The results of significant diagnostics from this hospitalization (including imaging, microbiology, ancillary and laboratory) are listed below for reference.    Significant Diagnostic Studies: Ct Abdomen Pelvis W Contrast  Result  Date: 07/14/2016 CLINICAL DATA:  Right-sided abdominal pain and diarrhea. History of colitis, chronic right-sided UPJ obstruction/stricture and prior hysterectomy. EXAM: CT ABDOMEN AND PELVIS WITH CONTRAST TECHNIQUE: Multidetector CT imaging of the abdomen and pelvis was performed using the standard protocol following bolus administration of intravenous contrast. CONTRAST:  100 mL ISOVUE-300 IOPAMIDOL (ISOVUE-300) INJECTION 61% COMPARISON:  CT of the abdomen and pelvis without contrast on 04/26/2016 and CTA of the abdomen and pelvis on 10/28/2015 FINDINGS: Lower chest: No acute abnormality. Hepatobiliary: The liver demonstrates mild steatosis. No hepatic masses or biliary dilatation. The gallbladder appears normal. Pancreas: Unremarkable. No pancreatic ductal dilatation or surrounding inflammatory changes. Spleen: Normal in size without focal abnormality. Adrenals/Urinary Tract: Adrenal glands appear normal. The kidneys have a stable appearance with stable mildly dilated right renal pelvis and renal collecting system without delayed excretion of contrast. Findings are consistent with chronic UPJ stenosis. No calculi or renal masses identified. Stomach/Bowel: Bowel shows no evidence of obstruction or inflammation. Stable diverticulosis of the sigmoid colon without evidence of diverticulitis. Vascular/Lymphatic: No significant vascular findings are present. No enlarged abdominal or pelvic lymph nodes. Reproductive: Status post hysterectomy. No adnexal masses. Other: No abdominal wall hernia or abnormality. No abdominopelvic ascites or focal abscess identified. Musculoskeletal: No acute or significant osseous findings. IMPRESSION: No acute findings. Stable hepatic steatosis, right renal UPJ stenosis and sigmoid colonic diverticulosis. Electronically Signed   By: Aletta Edouard M.D.   On: 07/14/2016 11:47    Microbiology: Recent Results (from the past 240 hour(s))  Gastrointestinal Panel by PCR , Stool     Status:  None   Collection Time: 07/14/16  2:14 PM  Result Value Ref Range Status   Campylobacter species NOT DETECTED NOT DETECTED Final   Plesimonas shigelloides NOT DETECTED NOT DETECTED Final   Salmonella species NOT DETECTED NOT DETECTED Final   Yersinia enterocolitica NOT DETECTED NOT DETECTED Final   Vibrio species NOT DETECTED NOT DETECTED Final   Vibrio cholerae NOT DETECTED NOT DETECTED Final   Enteroaggregative E coli (EAEC) NOT DETECTED NOT DETECTED Final   Enteropathogenic E coli (EPEC) NOT DETECTED NOT DETECTED Final   Enterotoxigenic E coli (ETEC) NOT DETECTED NOT DETECTED Final   Shiga like toxin producing E coli (STEC) NOT DETECTED NOT DETECTED Final   Shigella/Enteroinvasive E coli (EIEC) NOT DETECTED NOT DETECTED Final   Cryptosporidium NOT DETECTED NOT DETECTED Final   Cyclospora cayetanensis  NOT DETECTED NOT DETECTED Final   Entamoeba histolytica NOT DETECTED NOT DETECTED Final   Giardia lamblia NOT DETECTED NOT DETECTED Final   Adenovirus F40/41 NOT DETECTED NOT DETECTED Final   Astrovirus NOT DETECTED NOT DETECTED Final   Norovirus GI/GII NOT DETECTED NOT DETECTED Final   Rotavirus A NOT DETECTED NOT DETECTED Final   Sapovirus (I, II, IV, and V) NOT DETECTED NOT DETECTED Final  C difficile quick scan w PCR reflex     Status: None   Collection Time: 07/15/16  7:00 PM  Result Value Ref Range Status   C Diff antigen NEGATIVE NEGATIVE Final   C Diff toxin NEGATIVE NEGATIVE Final   C Diff interpretation No C. difficile detected.  Final     Labs: Basic Metabolic Panel:  Recent Labs Lab 07/10/16 1108 07/14/16 0916 07/14/16 1059 07/15/16 0512  NA 137 141 142 142  K 4.2 4.4 4.1 4.1  CL 101 105 105 111  CO2 28 26  --  23  GLUCOSE 129* 159* 133* 92  BUN 9 8 6 7   CREATININE 1.03 0.87 0.80 0.67  CALCIUM 10.7* 10.4*  --  9.1   Liver Function Tests:  Recent Labs Lab 07/14/16 0916 07/15/16 0512  AST 17 15  ALT 15 10*  ALKPHOS 84 62  BILITOT 0.7 0.4  PROT 7.1  5.6*  ALBUMIN 3.9 3.0*    Recent Labs Lab 07/14/16 0916  LIPASE 27   No results for input(s): AMMONIA in the last 168 hours. CBC:  Recent Labs Lab 07/14/16 0916 07/14/16 1059 07/15/16 0512  WBC 10.5  --  7.5  HGB 14.6 15.6* 12.3  HCT 45.2 46.0 39.0  MCV 94.2  --  94.2  PLT 229  --  181   Cardiac Enzymes: No results for input(s): CKTOTAL, CKMB, CKMBINDEX, TROPONINI in the last 168 hours. BNP: BNP (last 3 results) No results for input(s): BNP in the last 8760 hours.  ProBNP (last 3 results) No results for input(s): PROBNP in the last 8760 hours.  CBG:  Recent Labs Lab 07/15/16 1658 07/15/16 2148 07/16/16 0731 07/16/16 1156 07/16/16 1644  GLUCAP 106* 130* 103* 117* 183*       Signed:  Nita Sells MD   [facility] 07/16/2016, 5:14 PM

## 2016-07-26 ENCOUNTER — Emergency Department (HOSPITAL_COMMUNITY)
Admission: EM | Admit: 2016-07-26 | Discharge: 2016-07-26 | Disposition: A | Discharge status: Home / Self Care | Payer: Medicare Other | Attending: Emergency Medicine | Admitting: Emergency Medicine

## 2016-07-26 ENCOUNTER — Encounter (HOSPITAL_COMMUNITY): Payer: Self-pay | Admitting: Emergency Medicine

## 2016-07-26 DIAGNOSIS — T424X1A Poisoning by benzodiazepines, accidental (unintentional), initial encounter: Secondary | ICD-10-CM | POA: Insufficient documentation

## 2016-07-26 DIAGNOSIS — Z794 Long term (current) use of insulin: Secondary | ICD-10-CM | POA: Diagnosis not present

## 2016-07-26 DIAGNOSIS — E119 Type 2 diabetes mellitus without complications: Secondary | ICD-10-CM | POA: Insufficient documentation

## 2016-07-26 DIAGNOSIS — Z96641 Presence of right artificial hip joint: Secondary | ICD-10-CM | POA: Insufficient documentation

## 2016-07-26 DIAGNOSIS — Z79899 Other long term (current) drug therapy: Secondary | ICD-10-CM | POA: Diagnosis not present

## 2016-07-26 DIAGNOSIS — E1142 Type 2 diabetes mellitus with diabetic polyneuropathy: Secondary | ICD-10-CM | POA: Diagnosis not present

## 2016-07-26 DIAGNOSIS — E039 Hypothyroidism, unspecified: Secondary | ICD-10-CM | POA: Insufficient documentation

## 2016-07-26 DIAGNOSIS — F419 Anxiety disorder, unspecified: Secondary | ICD-10-CM | POA: Diagnosis not present

## 2016-07-26 DIAGNOSIS — T50901A Poisoning by unspecified drugs, medicaments and biological substances, accidental (unintentional), initial encounter: Secondary | ICD-10-CM

## 2016-07-26 LAB — COMPREHENSIVE METABOLIC PANEL
ALT: 13 U/L — ABNORMAL LOW (ref 14–54)
AST: 13 U/L — ABNORMAL LOW (ref 15–41)
Albumin: 3.6 g/dL (ref 3.5–5.0)
Alkaline Phosphatase: 83 U/L (ref 38–126)
Anion gap: 7 (ref 5–15)
BUN: 15 mg/dL (ref 6–20)
CO2: 23 mmol/L (ref 22–32)
Calcium: 9.6 mg/dL (ref 8.9–10.3)
Chloride: 106 mmol/L (ref 101–111)
Creatinine, Ser: 0.98 mg/dL (ref 0.44–1.00)
GFR calc Af Amer: >60 mL/min (ref >60)
GFR calc non Af Amer: 58 mL/min — ABNORMAL LOW (ref >60)
Glucose, Bld: 189 mg/dL — ABNORMAL HIGH (ref 65–99)
Potassium: 4.2 mmol/L (ref 3.5–5.1)
Sodium: 136 mmol/L (ref 135–145)
Total Bilirubin: 0.4 mg/dL (ref 0.3–1.2)
Total Protein: 6.7 g/dL (ref 6.5–8.1)

## 2016-07-26 LAB — SALICYLATE LEVEL: Salicylate Lvl: <7 mg/dL (ref 2.8–30.0)

## 2016-07-26 LAB — RAPID URINE DRUG SCREEN, HOSP PERFORMED
Amphetamines: NOT DETECTED
Barbiturates: NOT DETECTED
Benzodiazepines: POSITIVE — AB
Cocaine: NOT DETECTED
Opiates: NOT DETECTED
Tetrahydrocannabinol: NOT DETECTED

## 2016-07-26 LAB — CBC
HCT: 43.1 % (ref 36.0–46.0)
Hemoglobin: 14.2 g/dL (ref 12.0–15.0)
MCH: 30.5 pg (ref 26.0–34.0)
MCHC: 32.9 g/dL (ref 30.0–36.0)
MCV: 92.7 fL (ref 78.0–100.0)
Platelets: 220 10*3/uL (ref 150–400)
RBC: 4.65 MIL/uL (ref 3.87–5.11)
RDW: 12.7 % (ref 11.5–15.5)
WBC: 11 10*3/uL — ABNORMAL HIGH (ref 4.0–10.5)

## 2016-07-26 LAB — CBG MONITORING, ED: Glucose-Capillary: 161 mg/dL — ABNORMAL HIGH (ref 65–99)

## 2016-07-26 LAB — ACETAMINOPHEN LEVEL: Acetaminophen (Tylenol), Serum: <10 ug/mL — ABNORMAL LOW (ref 10–30)

## 2016-07-26 LAB — ETHANOL: Alcohol, Ethyl (B): <5 mg/dL (ref <5)

## 2016-07-26 MED ORDER — DIPHENHYDRAMINE HCL 50 MG/ML IJ SOLN: 12.5000 mg | Freq: Once | INTRAMUSCULAR | Status: DC

## 2016-07-26 MED ORDER — ACETAMINOPHEN 325 MG PO TABS
650.0000 mg | ORAL_TABLET | Freq: Once | ORAL | Status: AC
  Administered 2016-07-26: 650 mg via ORAL
  Filled 2016-07-26: qty 2

## 2016-07-26 NOTE — ED Triage Notes (Signed)
Patient here with complaints of overdose on Ativan. Reports that she has been feeling very anxious and took approximately 5-6 tablets over 3 hours. Patient shaking in triage.

## 2016-07-26 NOTE — ED Provider Notes (Signed)
[facility] DEPT Provider Note   CSN: [id number] Arrival date & time: 07/26/16  0737     History   Chief Complaint Chief Complaint  Patient presents with  . Drug Overdose    HPI Tanairy Payeur is a 71 y.o. female.  HPI   Patient is a 72 year old female with history of diabetes, anxiety, depression, fibromyalgia, osteoarthritis, IBS, RLS, hyperlipidemia, hypothyroidism and alcohol abuse who presents to the ED from home with complaint of drug overdose. Patient reports over the last week she has had worsening anxiety due to her and her husband not getting along. She states she typically takes 2 mg Ativan for times daily for her anxiety. Patient states she took 2 tablets this morning around 5:30 and due to not having improvement of symptoms she took another dose at 7 AM, 8 AM and 8:30 AM. Patient denies taking the medications to hurt herself or as a suicide attempt. She notes she was just trying to help with her anxiety. Patient denies any improvement of symptoms but notes her husband brought her to the ED due to concern for drug overdose. Patient currently denies any pain or complaints at this time but just notes she continues to feel anxious and shaky. She denies any alcohol or drug use. Denies fever, chills, headache, dizziness, visual changes, neck/back pain, chest pain, difficulty breathing, abdominal pain, nausea, vomiting, diarrhea, urinary symptoms, numbness, tingling, weakness. Patient denies SI, HI or hallucinations.  Past Medical History:  Diagnosis Date  . Alcohol abuse   . Anxiety    takes Valium daily as needed and Ativan daily  . Bilateral hearing loss   . Bipolar 1 disorder ([place])    takes Lithium nightly and Synthroid daily  . Chronic back pain    DDD  . Colitis, ischemic ([place]) 2012  . Confusion    r/t meds  . Depression    takes Prozac daily and Bupspirone   . Diabetes ([place])   . Diabetes mellitus without complication ([place])    takes Janumet daily  .  Diverticulosis   . Diverticulosis   . Dyslipidemia    takes Crestor daily  . Fibromyalgia   . Hepatic steatosis 06/18/12   severe  . History of bronchitis    last time at least 75yrs ago  . Hyperlipidemia   . Hypothyroidism   . IBS (irritable bowel syndrome)   . Ischemic colitis ([place])   . Joint pain   . Joint swelling   . Lupus    tumid-skin  . Nephrolithiasis   . Numbness    in right foot  . Osteoarthritis    in hips  . Osteoarthritis cervical spine   . Osteoarthritis of hand    bilateral  . Restless leg syndrome    takes Requip nightly  . Sciatica   . Urinary frequency   . Urinary leakage   . Urinary urgency   . Urinary, incontinence, stress female   . Walking pneumonia    last time more than 33yrs ago    Patient Active Problem List   Diagnosis Date Noted  . Hypothyroidism 07/14/2016  . Diarrhea   . Generalized abdominal pain   . Major depressive disorder, recurrent severe without psychotic features ([place]) 12/31/2015  . Nausea without vomiting   . Colitis 10/28/2015  . Unintentional poisoning by psychotropic drug 07/05/2015  . Restless leg syndrome 07/05/2015  . Diabetic polyneuropathy associated with type 2 diabetes mellitus ([place]) 09/21/2014  . Arthritis of left hip 06/09/2013  . Arthritis of right  hip 06/08/2013  . Hip pain, right 05/02/2013  . IBS (irritable bowel syndrome) 01/02/2013  . Unspecified vitamin D deficiency 07/05/2012  . Pure hyperglyceridemia 07/04/2012  . Diabetes mellitus with diabetic neuropathy, without long-term current use of insulin ([place]) 06/18/2012  . Hypertension 06/18/2012  . Bipolar 1 disorder ([place]) 06/18/2012  . Low back pain 02/25/2011  . Acute ischemic colitis ([place]) 01/06/2011  . Sciatica 12/19/2010  . Fibromyalgia 10/28/2010  . Primary osteoarthritis of right hip 10/24/2010  . Hyperlipidemia with target low density lipoprotein (LDL) cholesterol less than 100 mg/dL 05/05/2010  . DEPRESSION/ANXIETY 05/05/2010  . ABUSE,  ALCOHOL, IN REMISSION 05/05/2010  . OTITIS MEDIA, CHRONIC 05/05/2010  . HEARING LOSS, BILATERAL 05/05/2010  . ALLERGIC RHINITIS, SEASONAL, MILD 05/05/2010  . URINARY INCONTINENCE, STRESS, FEMALE 05/05/2010  . OSTEOARTHRITIS, HANDS, BILATERAL 05/05/2010  . OSTEOARTHRITIS, CERVICAL SPINE 05/05/2010    Past Surgical History:  Procedure Laterality Date  . ABDOMINAL HYSTERECTOMY    . APPENDECTOMY    . COLONOSCOPY    . D&C x 4    . DENTAL SURGERY Left 2 weeks ago    dental implant  . ESOPHAGOGASTRODUODENOSCOPY    . excision of tumors at right neck     angle of jaw '68, benign  . FLEXIBLE SIGMOIDOSCOPY N/A 06/21/2012   Procedure: FLEXIBLE SIGMOIDOSCOPY;  Surgeon: Jerene Bears, MD;  Location: [facility];  Service: Gastroenterology;  Laterality: N/A;  . TONSILLECTOMY    . TOTAL HIP ARTHROPLASTY Right 06/09/2013   Procedure: TOTAL HIP ARTHROPLASTY;  Surgeon: Kerin Salen, MD;  Location: [facility];  Service: Orthopedics;  Laterality: Right;  . TUBAL LIGATION      OB History    No data available       Home Medications    Prior to Admission medications   Medication Sig Start Date End Date Taking? Authorizing Provider  acetaminophen (TYLENOL) 500 MG tablet Take 500-1,000 mg by mouth every 6 (six) hours as needed for mild pain, moderate pain, fever or headache.    Yes [provider]  atorvastatin (LIPITOR) 40 MG tablet Take 40 mg by mouth at bedtime.    Yes [provider]  cholecalciferol (VITAMIN D) 1000 units tablet Take 2,000 Units by mouth daily.    Yes [provider]  divalproex (DEPAKOTE ER) 250 MG 24 hr tablet Take 1,000 mg by mouth at bedtime.   Yes [provider]  donepezil (ARICEPT) 10 MG tablet Take 10 mg by mouth at bedtime.   Yes [provider]  escitalopram (LEXAPRO) 10 MG tablet Take 5 mg by mouth daily.    Yes [provider]  gabapentin (NEURONTIN) 100 MG capsule Take 300 mg by mouth 2 (two) times daily.   Yes  [provider]  insulin glargine (LANTUS) 100 unit/mL SOPN Inject 24 Units into the skin at bedtime.    Yes [provider]  insulin lispro (HUMALOG KWIKPEN) 100 UNIT/ML KiwkPen Inject 6-8 Units into the skin 3 (three) times daily. Pt uses 6 units before breakfast and lunch and 8 units before dinner.   Yes [provider]  levothyroxine (SYNTHROID, LEVOTHROID) 75 MCG tablet Take 1 tablet (75 mcg total) by mouth daily before breakfast. I tablet daily before breakfast 05/29/16  Yes Elayne Snare, MD  liothyronine (CYTOMEL) 25 MCG tablet Take 25 mcg by mouth daily.   Yes [provider]  lithium 300 MG tablet Take 600 mg by mouth at bedtime.    Yes [provider]  loperamide (  IMODIUM) 2 MG capsule Take 1 capsule (2 mg total) by mouth 4 (four) times daily as needed for diarrhea or loose stools. 07/16/16  Yes Nita Sells, MD  LORazepam (ATIVAN) 2 MG tablet Take 1-2 mg by mouth 4 (four) times daily as needed for anxiety.    Yes [provider]  losartan (COZAAR) 50 MG tablet Take 50 mg by mouth daily.   Yes [provider]  memantine (NAMENDA) 10 MG tablet Take 10 mg by mouth at bedtime.   Yes [provider]  QUEtiapine (SEROQUEL) 200 MG tablet Take 100 mg by mouth at bedtime.    Yes [provider]  rOPINIRole (REQUIP) 0.5 MG tablet Take 0.5-1 mg by mouth See admin instructions. 0.5 mg at 0500 and 1mg  at bedtime (tapering medication)   Yes [provider]  VICTOZA 18 MG/3ML SOPN inject 1.2 milligram subcutaneously daily 07/08/16  Yes Elayne Snare, MD    Family History Family History  Problem Relation Age of Onset  . Drug abuse Mother   . Alcohol abuse Mother   . Alcohol abuse Father   . Colon cancer Paternal Uncle   . Diabetes Brother   . Alcohol abuse Brother        x 2    Social History Social History  Substance Use Topics  . Smoking status: Never Smoker  . Smokeless tobacco: Never Used  .  Alcohol use No     Allergies   Codeine; Macrolides and ketolides; Procaine hcl; Aspirin; Diflucan [fluconazole]; Dilaudid [hydromorphone hcl]; Morphine and related; Other; and Sulfa antibiotics   Review of Systems Review of Systems  Psychiatric/Behavioral:       Anxiety  All other systems reviewed and are negative.    Physical Exam Updated Vital Signs BP (!) 113/58   Pulse 86   Temp 97.8 F (36.6 C) (Oral)   Resp 16   SpO2 91%   Physical Exam  Constitutional: She is oriented to person, place, and time. She appears well-developed and well-nourished. No distress.  Pt appears mildly anxious on exam.  HENT:  Head: Normocephalic and atraumatic.  Mouth/Throat: Oropharynx is clear and moist. No oropharyngeal exudate.  Eyes: Conjunctivae and EOM are normal. Pupils are equal, round, and reactive to light. Right eye exhibits no discharge. Left eye exhibits no discharge. No scleral icterus.  Neck: Normal range of motion. Neck supple.  Cardiovascular: Normal rate, regular rhythm, normal heart sounds and intact distal pulses.   Pulmonary/Chest: Effort normal and breath sounds normal. No respiratory distress. She has no wheezes. She has no rales. She exhibits no tenderness.  Abdominal: Soft. Bowel sounds are normal. She exhibits no distension and no mass. There is no tenderness. There is no rebound and no guarding. No hernia.  Musculoskeletal: Normal range of motion. She exhibits no edema.  Neurological: She is alert and oriented to person, place, and time.  Mildly tremulous to BUE.  Skin: Skin is warm and dry. She is not diaphoretic.  Psychiatric: Her speech is normal and behavior is normal. Her mood appears anxious. Thought content is not paranoid and not delusional. Cognition and memory are normal. She expresses no homicidal and no suicidal ideation.  Nursing note and vitals reviewed.    ED Treatments / Results  Labs (all labs ordered are listed, but only abnormal results are  displayed) Labs Reviewed  COMPREHENSIVE METABOLIC PANEL - Abnormal; Notable for the following:       Result Value   Glucose, Bld 189 (*)  AST 13 (*)    ALT 13 (*)    GFR calc non Af Amer 58 (*)    All other components within normal limits  ACETAMINOPHEN LEVEL - Abnormal; Notable for the following:    Acetaminophen (Tylenol), Serum <10 (*)    All other components within normal limits  CBC - Abnormal; Notable for the following:    WBC 11.0 (*)    All other components within normal limits  RAPID URINE DRUG SCREEN, HOSP PERFORMED - Abnormal; Notable for the following:    Benzodiazepines POSITIVE (*)    All other components within normal limits  CBG MONITORING, ED - Abnormal; Notable for the following:    Glucose-Capillary 161 (*)    All other components within normal limits  SALICYLATE LEVEL  ETHANOL    EKG  EKG Interpretation None       Radiology No results found.  Procedures Procedures (including critical care time)  Medications Ordered in ED Medications  diphenhydrAMINE (BENADRYL) injection 12.5 mg (12.5 mg Intravenous Refused 07/26/16 1112)  acetaminophen (TYLENOL) tablet 650 mg (650 mg Oral Given 07/26/16 1111)     Initial Impression / Assessment and Plan / ED Course  I have reviewed the triage vital signs and the nursing notes.  Pertinent labs & imaging results that were available during my care of the patient were reviewed by me and considered in my medical decision making (see chart for details).     Pt Presents the ED after taking 5 tablets of her 2 mg Ativan at home due to worsening anxiety which she relates to stress associated with her and her husband not getting along recently. Denies any other pain or complaints. Reports she has not been taking her full dose of Seroquel which was recently increased by her psychiatrist. VSS. Exam unremarkable. Labs unremarkable. Denies SI or HI. Patient reports only taking an increased dose of her Ativan to help with her  anxiety, denies suicide attempt. Pt was evaluated in the ED for appx. 3 hours without any complications, has remained hemodynamically stable. Discussed pt with Dr. Vanita Panda who also evaluated pt in the ED. advised patient to take her home meds as prescribed and follow up with her psychiatrist at her scheduled appointment on Wednesday.  Final Clinical Impressions(s) / ED Diagnoses   Final diagnoses:  Accidental drug overdose, initial encounter  Anxiety    New Prescriptions New Prescriptions   No medications on file       Renold Don 07/26/16 1152    Carmin Muskrat, MD 07/26/16 1600

## 2016-07-26 NOTE — Discharge Instructions (Signed)
Continue taking her medications as prescribed. Follow-up with your psychiatrist at her scheduled appointment on Wednesday.

## 2016-07-30 ENCOUNTER — Ambulatory Visit (INDEPENDENT_AMBULATORY_CARE_PROVIDER_SITE_OTHER): Payer: Medicare Other | Admitting: Gastroenterology

## 2016-07-30 ENCOUNTER — Encounter: Payer: Self-pay | Admitting: Gastroenterology

## 2016-07-30 VITALS — BP 92/64 | HR 96 | Ht 62.0 in | Wt 172.8 lb

## 2016-07-30 DIAGNOSIS — K580 Irritable bowel syndrome with diarrhea: Secondary | ICD-10-CM

## 2016-07-30 NOTE — Patient Instructions (Addendum)
Benefiber  -one scoop in a glass of water or juice once daily.  If you are age 69 or older, your body mass index should be between 23-30. Your Body mass index is 31.61 kg/m. If this is out of the aforementioned range listed, please consider follow up with your Primary Care Provider.  If you are age 69 or younger, your body mass index should be between 19-25. Your Body mass index is 31.61 kg/m. If this is out of the aformentioned range listed, please consider follow up with your Primary Care Provider.   Thank you for choosing [facility]  Dr Wilfrid Lund III   Food Guidelines for a sensitive stomach  Many people have difficulty digesting certain foods, causing a variety of distressing and embarrassing symptoms such as abdominal pain, bloating and gas.  These foods may need to be avoided or consumed in small amounts.  Here are some tips that might be helpful for you.  1.   Lactose intolerance is the difficulty or complete inability to digest lactose, the natural sugar in milk and anything made from milk.  This condition is harmless, common, and can begin any time during life.  Some people can digest a modest amount of lactose while others cannot tolerate any.  Also, not all dairy products contain equal amounts of lactose.  For example, hard cheeses such as parmesan have less lactose than soft cheeses such as cheddar.  Yogurt has less lactose than milk or cheese.  Many packaged foods (even many brands of bread) have milk, so read ingredient lists carefully.  It is difficult to test for lactose intolerance, so just try avoiding lactose as much as possible for a week and see what happens with your symptoms.  If you seem to be lactose intolerant, the best plan is to avoid it (but make sure you get calcium from another source).  The next best thing is to use lactase enzyme supplements, available over the counter everywhere.  Just know that many lactose intolerant people need to take several tablets with each  serving of dairy to avoid symptoms.  Lastly, a lot of restaurant food is made with milk or butter.  Many are things you might not suspect, such as mashed potatoes, rice and pasta (cooked with butter) and "grilled" items.  If you are lactose intolerant, it never hurts to ask your server what has milk or butter.  2.   Fiber is an important part of your diet, but not all fiber is well-tolerated.  Insoluble fiber such as bran is often consumed by normal gut bacteria and converted into gas.  Soluble fiber such as oats, squash, carrots and green beans are typically tolerated better.  3.   Some types of carbohydrates can be poorly digested.  Examples include: fructose (apples, cherries, pears, raisins and other dried fruits), fructans (onions, zucchini, large amounts of wheat), sorbitol/mannitol/xylitol and sucralose/Splenda (common artificial sweeteners), and raffinose (lentils, broccoli, cabbage, asparagus, brussel sprouts, many types of beans).  Do a Development worker, community for [employer] and you will find helpful information. Beano, a dietary supplement, will often help with raffinose-containing foods.  As with lactase tablets, you may need several per serving.  4.   Whenever possible, avoid processed food&meats and chemical additives.  High fructose corn syrup, a common sweetener, may be difficult to digest.  Eggs and soy (comes from the soybean, and added to many foods now) are the other most common bloating/gassy foods.  - Dr. Herma [facility]

## 2016-07-30 NOTE — Progress Notes (Signed)
     [facility] Progress Note  Chief Complaint: Abdominal pain and diarrhea  Subjective  History:  This is a 71 year old woman known to me from evaluation last June and September. She has long-standing diarrhea predominant IBS. She also had 2 episodes of ischemic colitis in 2012 and 2014, based on colonoscopy and imaging. She did not improve on a trial of Levbid last September. I was reluctant to give her Lomotil out of concern that might precipitate constipation and then perhaps ischemic colitis. I'm not seen her since last September. She was admitted to the hospital about 3 weeks ago with a few weeks of intractable diarrhea. She had not had prior travel, sick contacts or antibiotic use. She was seen by my partner Dr. Henrene Pastor in consultation, see that note for details. He did not feel that her imaging looked like ischemic colitis, they felt this was either infectious or perhaps due to metformin that it recently been started. She tested negative for C. difficile. Wendolyn reports that since discharge she has only had a couple episodes of diarrhea. She still has very severe anxiety.   She is quite bothered by the fact that the diarrhea may occur without much if any warning with urgency. ROS: Cardiovascular:  no chest pain Respiratory: no dyspnea  The patient's Past Medical, Family and Social History were reviewed and are on file in the EMR.  Objective:  Med list reviewed  Vital signs in last 24 hrs: Vitals:   07/30/16 1611  BP: 92/64  Pulse: 96    Physical Exam  Anxious-appearing woman with a blunted affect. Her husband is present for the entire encounter.  HEENT: sclera anicteric, oral mucosa moist without lesions  Neck: supple, no thyromegaly, JVD or lymphadenopathy  Cardiac: RRR without murmurs, S1S2 heard, no peripheral edema  Pulm: clear to auscultation bilaterally, normal RR and effort noted  Abdomen: soft, mild bilateral lower tenderness, with active bowel  sounds. No guarding or palpable hepatosplenomegaly.  Skin; warm and dry, no jaundice or rash  Recent labs and stool studies from hospital stay were reviewed   @ASSESSMENTPLANBEGIN @ Assessment: Encounter Diagnosis  Name Primary?  . Irritable bowel syndrome with diarrhea Yes    I really do not feel have any other medicines to offer for her IBS. Unless she were to have protracted diarrhea such as what prompted the recent hospitalization, I would not use Lomotil on a regular basis. She can certainly call me if something like that occurs again, and we can use Lomotil for a short course. I would not put her on a 5-HT 3 antagonist because she has had previous ischemic colitis.  Plan:  As needed use of Imodium Benefiber once daily Written dietary advice given  Total time 20 minutes, over half spent in counseling and coordination of care.   [name]

## 2016-08-06 DIAGNOSIS — F319 Bipolar disorder, unspecified: Secondary | ICD-10-CM | POA: Diagnosis not present

## 2016-08-07 ENCOUNTER — Emergency Department

## 2016-08-07 ENCOUNTER — Emergency Department: Admit: 2016-08-07 | Payer: MEDICARE | Primary: Family Medicine

## 2016-08-07 ENCOUNTER — Inpatient Hospital Stay
Admit: 2016-08-07 | Discharge: 2016-08-07 | Disposition: A | Discharge status: Home / Self Care | Payer: MEDICARE | Attending: Emergency Medicine

## 2016-08-07 DIAGNOSIS — R29898 Other symptoms and signs involving the musculoskeletal system: Secondary | ICD-10-CM | POA: Diagnosis not present

## 2016-08-07 DIAGNOSIS — T426X5A Adverse effect of other antiepileptic and sedative-hypnotic drugs, initial encounter: Secondary | ICD-10-CM | POA: Diagnosis not present

## 2016-08-07 DIAGNOSIS — R5383 Other fatigue: Secondary | ICD-10-CM | POA: Diagnosis not present

## 2016-08-07 DIAGNOSIS — M545 Low back pain: Secondary | ICD-10-CM | POA: Diagnosis not present

## 2016-08-07 DIAGNOSIS — M542 Cervicalgia: Secondary | ICD-10-CM | POA: Diagnosis not present

## 2016-08-07 DIAGNOSIS — R4182 Altered mental status, unspecified: Secondary | ICD-10-CM | POA: Diagnosis not present

## 2016-08-07 DIAGNOSIS — T426X1A Poisoning by other antiepileptic and sedative-hypnotic drugs, accidental (unintentional), initial encounter: Secondary | ICD-10-CM | POA: Diagnosis not present

## 2016-08-07 LAB — ACETAMINOPHEN: Acetaminophen level: <2 ug/mL — ABNORMAL LOW (ref 10–30)

## 2016-08-07 LAB — METABOLIC PANEL, COMPREHENSIVE
A-G Ratio: 1 — ABNORMAL LOW (ref 1.1–2.2)
ALT (SGPT): 18 U/L (ref 12–78)
AST (SGOT): 11 U/L — ABNORMAL LOW (ref 15–37)
Albumin: 3.4 g/dL — ABNORMAL LOW (ref 3.5–5.0)
Alk. phosphatase: 106 U/L (ref 45–117)
Anion gap: 10 mmol/L (ref 5–15)
BUN/Creatinine ratio: 8 — ABNORMAL LOW (ref 12–20)
BUN: 9 MG/DL (ref 6–20)
Bilirubin, total: 0.3 MG/DL (ref 0.2–1.0)
CO2: 21 mmol/L (ref 21–32)
Calcium: 10 MG/DL (ref 8.5–10.1)
Chloride: 109 mmol/L — ABNORMAL HIGH (ref 97–108)
Creatinine: 1.16 MG/DL — ABNORMAL HIGH (ref 0.55–1.02)
GFR est AA: 56 mL/min/{1.73_m2} — ABNORMAL LOW (ref >60)
GFR est non-AA: 46 mL/min/{1.73_m2} — ABNORMAL LOW (ref >60)
Globulin: 3.5 g/dL (ref 2.0–4.0)
Glucose: 209 mg/dL — ABNORMAL HIGH (ref 65–100)
Potassium: 4.6 mmol/L (ref 3.5–5.1)
Protein, total: 6.9 g/dL (ref 6.4–8.2)
Sodium: 140 mmol/L (ref 136–145)

## 2016-08-07 LAB — CBC WITH AUTOMATED DIFF
ABS. BASOPHILS: 0 10*3/uL (ref 0.0–0.1)
ABS. EOSINOPHILS: 0.2 10*3/uL (ref 0.0–0.4)
ABS. IMM. GRANS.: 0.1 10*3/uL — ABNORMAL HIGH (ref 0.00–0.04)
ABS. LYMPHOCYTES: 2.3 10*3/uL (ref 0.8–3.5)
ABS. MONOCYTES: 0.5 10*3/uL (ref 0.0–1.0)
ABS. NEUTROPHILS: 5.7 10*3/uL (ref 1.8–8.0)
ABSOLUTE NRBC: 0 10*3/uL (ref 0.00–0.01)
BASOPHILS: 0 % (ref 0–1)
EOSINOPHILS: 2 % (ref 0–7)
HCT: 42.7 % (ref 35.0–47.0)
HGB: 13.7 g/dL (ref 11.5–16.0)
IMMATURE GRANULOCYTES: 1 % — ABNORMAL HIGH (ref 0.0–0.5)
LYMPHOCYTES: 26 % (ref 12–49)
MCH: 30.4 PG (ref 26.0–34.0)
MCHC: 32.1 g/dL (ref 30.0–36.5)
MCV: 94.7 FL (ref 80.0–99.0)
MONOCYTES: 5 % (ref 5–13)
MPV: 10.7 FL (ref 8.9–12.9)
NEUTROPHILS: 65 % (ref 32–75)
NRBC: 0 PER 100 WBC
PLATELET: 199 10*3/uL (ref 150–400)
RBC: 4.51 M/uL (ref 3.80–5.20)
RDW: 12.5 % (ref 11.5–14.5)
WBC: 8.8 10*3/uL (ref 3.6–11.0)

## 2016-08-07 LAB — SALICYLATE: Salicylate level: <1.7 MG/DL — ABNORMAL LOW (ref 2.8–20.0)

## 2016-08-07 LAB — URINALYSIS W/ REFLEX CULTURE
Bacteria: NEGATIVE /hpf
Bilirubin: NEGATIVE
Blood: NEGATIVE
Glucose: 100 mg/dL — AB
Ketone: NEGATIVE mg/dL
Nitrites: NEGATIVE
Protein: NEGATIVE mg/dL
Specific gravity: 1.012 (ref 1.003–1.030)
Urobilinogen: 0.2 EU/dL (ref 0.2–1.0)
pH (UA): 6.5 (ref 5.0–8.0)

## 2016-08-07 LAB — LITHIUM: Lithium level: 0.47 MMOL/L — ABNORMAL LOW (ref 0.60–1.20)

## 2016-08-07 LAB — NT-PRO BNP: NT pro-BNP: 45 PG/ML (ref 0–125)

## 2016-08-07 LAB — VALPROIC ACID: Valproic acid: 114 ug/ml — ABNORMAL HIGH (ref 50–100)

## 2016-08-07 LAB — MAGNESIUM: Magnesium: 2.1 mg/dL (ref 1.6–2.4)

## 2016-08-07 MED ORDER — CYCLOBENZAPRINE 10 MG TAB
10 mg | ORAL | Status: AC
Start: 2016-08-07 — End: 2016-08-07
  Administered 2016-08-07: 21:00:00 via ORAL

## 2016-08-07 MED ORDER — NAPROXEN 250 MG TAB
250 mg | ORAL | Status: AC
Start: 2016-08-07 — End: 2016-08-07
  Administered 2016-08-07: 21:00:00 via ORAL

## 2016-08-07 MED ORDER — NAPROXEN 500 MG TAB
500 mg | ORAL_TABLET | Freq: Two times a day (BID) | ORAL | 0 refills | Status: AC | PRN
Start: 2016-08-07 — End: ?

## 2016-08-07 MED ORDER — LIDOCAINE 5 % (700 MG/PATCH) ADHESIVE PATCH
5 % | CUTANEOUS | Status: DC
Start: 2016-08-07 — End: 2016-08-07

## 2016-08-07 MED ORDER — CYCLOBENZAPRINE 5 MG TAB
5 mg | ORAL_TABLET | Freq: Three times a day (TID) | ORAL | 0 refills | Status: AC | PRN
Start: 2016-08-07 — End: ?

## 2016-08-07 MED ORDER — SODIUM CHLORIDE 0.9% BOLUS IV
0.9 % | INTRAVENOUS | Status: AC
Start: 2016-08-07 — End: 2016-08-07
  Administered 2016-08-07: 21:00:00 via INTRAVENOUS

## 2016-08-07 MED ORDER — LIDOCAINE 4 % TOPICAL PATCH (12 HOUR DURATION)
4 % | MEDICATED_PATCH | Freq: Two times a day (BID) | CUTANEOUS | 0 refills | Status: AC
Start: 2016-08-07 — End: ?

## 2016-08-07 MED ORDER — DIVALPROEX 500 MG 24 HR TAB
500 mg | ORAL_TABLET | Freq: Every evening | ORAL | 0 refills | Status: AC
Start: 2016-08-07 — End: ?

## 2016-08-07 MED ORDER — GABAPENTIN 300 MG CAP
300 mg | ORAL_CAPSULE | Freq: Three times a day (TID) | ORAL | 0 refills | Status: AC
Start: 2016-08-07 — End: ?

## 2016-08-07 MED FILL — LIDOCAINE 5 % (700 MG/PATCH) ADHESIVE PATCH: 5 % | CUTANEOUS | Qty: 1

## 2016-08-07 MED FILL — NAPROXEN 250 MG TAB: 250 mg | ORAL | Qty: 2

## 2016-08-07 MED FILL — CYCLOBENZAPRINE 10 MG TAB: 10 mg | ORAL | Qty: 1

## 2016-08-07 NOTE — ED Provider Notes (Signed)
EMERGENCY DEPARTMENT HISTORY AND PHYSICAL EXAM      Date: 08/07/2016  Patient Name: Destiny Blake    History of Presenting Illness     Chief Complaint   Patient presents with   ??? Abnormal Lab Results     pt is at residential home-- lithium levels and depakote levels were out of range   ??? Fatigue     pt reports to ed complaining of fatigue, pt fell on Wednesday after evening medications     PROVIDER IN TRIAGE NOTE:  2:04 PM  Lauren Pagliassotti, PA-C has evaluated the patient as the Provider in Triage (PIT). Pt presents to the Emergency Dept with c/o fatigue. They have reviewed the vital signs and the triage nurse assessment. They have talked with the patient and any available family and advised that the appropriate studies have been ordered to initiate the work up based on the clinical presentation during the assessment. The pt has been advised that they will be accommodated in the Main ED as soon as possible. The pt has been requested to contact the triage nurse or PIT immediately if they experiences any changes in their condition during this brief waiting period.    History Provided By: Patient and Caretaker    HPI: Destiny Blake, 71 y.o. female who presents ambulatory to the ED with cc of gradually worsening neck pain x 1 week. Pt reports associated fatigue, back pain greatest in the lower region and bilateral lower extremity pain. She denies use of medication to modify her symptoms. Pt reports that she has been ambulating with a shuffling gait since the onset of her symptoms. She states that her symptoms are exacerbated with movement. Pt reports falling 2 nights ago secondary to leaning down to get something off the floor. She denies any head injury or LOC. Per caretaker, pt had abnormal lithium levels noting a findings of 0.5 as well as abnormal Depakote level with findings significant for 102. Pt denies any dysuria, abdominal pain, constipation, CP, SOB, HA, fevers, or chills.     There are no other complaints, changes, or physical findings at this time.    PCP: Phys Other, MD    Past History     Past Medical History:  No past medical history on file.    Past Surgical History:  No past surgical history on file.    Family History:  No family history on file.    Social History:  Social History   Substance Use Topics   ??? Smoking status: Not on file   ??? Smokeless tobacco: Not on file   ??? Alcohol use Not on file       Allergies:  No Known Allergies      Review of Systems   Review of Systems   Constitutional: Negative for chills and fever.   Respiratory: Negative for cough and shortness of breath.    Cardiovascular: Negative for chest pain.   Gastrointestinal: Negative for abdominal pain, constipation, diarrhea, nausea and vomiting.   Genitourinary: Negative for dysuria.   Musculoskeletal: Positive for back pain (diffuse < lower), gait problem (shuffling), myalgias (bilateral lwoer extremities) and neck pain.   Neurological: Negative for weakness, numbness and headaches.   All other systems reviewed and are negative.      Physical Exam   Physical Exam   Constitutional: She is oriented to person, place, and time. She appears well-developed and well-nourished.   HENT:   Head: Normocephalic and atraumatic.   Eyes: Conjunctivae and EOM are normal.  Neck: Normal range of motion. Neck supple.   Cardiovascular: Normal rate and regular rhythm.    Pulmonary/Chest: Effort normal and breath sounds normal. No respiratory distress.   Abdominal: Soft. She exhibits no distension. There is no tenderness.   Musculoskeletal: Normal range of motion.   Tender over lower back greatest on the right low back. Pain elicited when raising right leg. Able to flex and extend hips bilaterally. Slightly unstable shuffling gait    Neurological: She is alert and oriented to person, place, and time.   Skin: Skin is warm and dry.   Psychiatric: She has a normal mood and affect.   Nursing note and vitals reviewed.       Diagnostic Study Results     Labs -     Recent Results (from the past 12 hour(s))   CBC WITH AUTOMATED DIFF    Collection Time: 08/07/16  2:08 PM   Result Value Ref Range    WBC 8.8 3.6 - 11.0 K/uL    RBC 4.51 3.80 - 5.20 M/uL    HGB 13.7 11.5 - 16.0 g/dL    HCT 42.7 35.0 - 47.0 %    MCV 94.7 80.0 - 99.0 FL    MCH 30.4 26.0 - 34.0 PG    MCHC 32.1 30.0 - 36.5 g/dL    RDW 12.5 11.5 - 14.5 %    PLATELET 199 150 - 400 K/uL    MPV 10.7 8.9 - 12.9 FL    NRBC 0.0 0 PER 100 WBC    ABSOLUTE NRBC 0.00 0.00 - 0.01 K/uL    NEUTROPHILS 65 32 - 75 %    LYMPHOCYTES 26 12 - 49 %    MONOCYTES 5 5 - 13 %    EOSINOPHILS 2 0 - 7 %    BASOPHILS 0 0 - 1 %    IMMATURE GRANULOCYTES 1 (H) 0.0 - 0.5 %    ABS. NEUTROPHILS 5.7 1.8 - 8.0 K/UL    ABS. LYMPHOCYTES 2.3 0.8 - 3.5 K/UL    ABS. MONOCYTES 0.5 0.0 - 1.0 K/UL    ABS. EOSINOPHILS 0.2 0.0 - 0.4 K/UL    ABS. BASOPHILS 0.0 0.0 - 0.1 K/UL    ABS. IMM. GRANS. 0.1 (H) 0.00 - 0.04 K/UL    DF AUTOMATED     METABOLIC PANEL, COMPREHENSIVE    Collection Time: 08/07/16  2:08 PM   Result Value Ref Range    Sodium 140 136 - 145 mmol/L    Potassium 4.6 3.5 - 5.1 mmol/L    Chloride 109 (H) 97 - 108 mmol/L    CO2 21 21 - 32 mmol/L    Anion gap 10 5 - 15 mmol/L    Glucose 209 (H) 65 - 100 mg/dL    BUN 9 6 - 20 MG/DL    Creatinine 1.16 (H) 0.55 - 1.02 MG/DL    BUN/Creatinine ratio 8 (L) 12 - 20      GFR est AA 56 (L) >60 ml/min/1.73m    GFR est non-AA 46 (L) >60 ml/min/1.751m   Calcium 10.0 8.5 - 10.1 MG/DL    Bilirubin, total 0.3 0.2 - 1.0 MG/DL    ALT (SGPT) 18 12 - 78 U/L    AST (SGOT) 11 (L) 15 - 37 U/L    Alk. phosphatase 106 45 - 117 U/L    Protein, total 6.9 6.4 - 8.2 g/dL    Albumin 3.4 (L) 3.5 - 5.0 g/dL    Globulin 3.5 2.0 -  4.0 g/dL    A-G Ratio 1.0 (L) 1.1 - 2.2     NT-PRO BNP    Collection Time: 08/07/16  2:08 PM   Result Value Ref Range    NT pro-BNP 45 0 - 125 PG/ML   MAGNESIUM    Collection Time: 08/07/16  2:08 PM   Result Value Ref Range    Magnesium 2.1 1.6 - 2.4 mg/dL   LITHIUM     Collection Time: 08/07/16  2:08 PM   Result Value Ref Range    Lithium level 0.47 (L) 0.60 - 1.20 MMOL/L    Reported dose date: NOT PROVIDED      Reported dose time: NOT PROVIDED      Reported dose: NOT PROVIDED UNITS   VALPROIC ACID    Collection Time: 08/07/16  2:08 PM   Result Value Ref Range    Valproic acid 114 (H) 50 - 841 ug/ml   SALICYLATE    Collection Time: 08/07/16  2:08 PM   Result Value Ref Range    Salicylate level <6.6 (L) 2.8 - 20.0 MG/DL   ACETAMINOPHEN    Collection Time: 08/07/16  2:08 PM   Result Value Ref Range    Acetaminophen level <2 (L) 10 - 30 ug/mL   URINALYSIS W/ REFLEX CULTURE    Collection Time: 08/07/16  2:36 PM   Result Value Ref Range    Color YELLOW/STRAW      Appearance CLEAR CLEAR      Specific gravity 1.012 1.003 - 1.030      pH (UA) 6.5 5.0 - 8.0      Protein NEGATIVE  NEG mg/dL    Glucose 100 (A) NEG mg/dL    Ketone NEGATIVE  NEG mg/dL    Bilirubin NEGATIVE  NEG      Blood NEGATIVE  NEG      Urobilinogen 0.2 0.2 - 1.0 EU/dL    Nitrites NEGATIVE  NEG      Leukocyte Esterase SMALL (A) NEG      WBC 5-10 0 - 4 /hpf    RBC 0-5 0 - 5 /hpf    Epithelial cells FEW FEW /lpf    Bacteria NEGATIVE  NEG /hpf    UA:UC IF INDICATED URINE CULTURE ORDERED (A) CNI      Hyaline cast 0-2 0 - 5 /lpf       Radiologic Studies -     CT Results  (Last 48 hours)               08/07/16 1431  CT HEAD WO CONT Final result    Impression:  IMPRESSION:    1. No evidence of acute intracranial abnormality by this modality.               Narrative:  EXAM:  CT HEAD WO CONT   INDICATION:   Head injury mild or moderate acute, no neurological deficit; fall   on Wednesday, change in mentation   Additional history: Patient fell 3 days previously, change in mentation.   COMPARISON: None.   .   TECHNIQUE:    Unenhanced CT of the head was performed using 5 mm images. Coronal and sagittal   reformats were produced. Brain and bone windows were generated.     CT dose reduction was achieved through use of a standardized protocol tailored   for this examination and automatic exposure control for dose modulation.   Marland Kitchen   FINDINGS:   The ventricles and sulci are normal in size,  shape and configuration and   midline. There is no significant white matter disease. There is no intracranial   hemorrhage, extra-axial collection, mass, mass effect or midline shift.  The   basilar cisterns are open. No acute infarct is identified. The bone windows   demonstrate hyperostosis frontalis. The visualized portions of the paranasal   sinuses and mastoid air cells are clear.   .           Medical Decision Making   I am the first provider for this patient.    I reviewed the vital signs, available nursing notes, past medical history, past surgical history, family history and social history.    Vital Signs-Reviewed the patient's vital signs.  Patient Vitals for the past 12 hrs:   Temp Pulse Resp BP SpO2   08/07/16 1356 98.9 ??F (37.2 ??C) (!) 101 16 142/82 99 %     Records Reviewed: Nursing Notes and Old Medical Records    Provider Notes (Medical Decision Making):   Patient presents with abnormal labwork at outside facility. Will repeat here and treat PRN.  With high depakote could cause her shuffling gait, however I think a lot of it is due to deconditioning and being in a new location and having to walk more and use stairs.     The patient complains of low back pain. These symptoms are consistent with a lumbar strain. Less likely GU pathology, aortic dissection or AAA, or cauda equina given that there are no red flags and a benign physical exam.     I have recommended rest, avoiding heavy lifting until better, use of intermittent heat (avoid sleeping on a heating pad), and use of OTC NSAID's (Advil, Aleve etc) or Tylenol prn for pain. Call PCP if back pain persists or she develops leg symptoms.  It has also been explained that  this may take up to three months to fully resolved and that smoking may slow that process even more.    ED Course:   Initial assessment performed. The patients presenting problems have been discussed, and they are in agreement with the care plan formulated and outlined with them.  I have encouraged them to ask questions as they arise throughout their visit.    CONSULT NOTE:  3:25 PM  Vernell Morgans, M.D spoke with Leeanne Mannan, MD,  Specialty: Neurology  Discussed pt's hx, disposition, and available diagnostic and imaging results. Reviewed care plans. Consultant recommends consulting pt's psychiatrist about changing depakote prescription. Consult states high Depakote levels extrapyramidal symptoms.  Written by Delories Heinz, ED scribe, as dictated by Vernell Morgans, M.D.    Disposition:    DISCHARGE NOTE  4:25 PM  The patient has been re-evaluated and is ready for discharge. Reviewed available results with patient. Counseled patient on diagnosis and care plan. Patient has expressed understanding, and all questions have been answered. Patient agrees with plan and agrees to follow up as recommended, or return to the ED if their symptoms worsen. Discharge instructions have been provided and explained to the patient, along with reasons to return to the ED.    PLAN:  1.   Discharge Medication List as of 08/07/2016  4:26 PM      START taking these medications    Details   gabapentin (NEURONTIN) 300 mg capsule Take 1 Cap by mouth three (3) times daily., Print, Disp-30 Cap, R-0      divalproex ER (DEPAKOTE ER) 500 mg ER tablet Take 1 Tab by mouth nightly., Print, Disp-30 Tab,  R-0           2.   Follow-up Information     Follow up With Details Comments Contact Info    Phys Other, MD Schedule an appointment as soon as possible for a visit About medications and status Patient can only remember the practice name and not the physician          Return to ED if worse     Diagnosis     Clinical Impression:    1. Valproic acid toxicity, accidental or unintentional, initial encounter    2. Acute midline low back pain without sciatica    3. Muscular deconditioning        Attestations:    This note is prepared by Delories Heinz, acting as Scribe for Vernell Morgans, M.D.    Vernell Morgans, M.D: The scribe's documentation has been prepared under my direction and personally reviewed by me in its entirety. I confirm that the note above accurately reflects all work, treatment, procedures, and medical decision making performed by me.

## 2016-08-07 NOTE — ED Notes (Signed)
Patient moved into [name] at the end of May-- pt moved into this facility [name], pt has been sober from alcohol for 8 years, Destiny Blake from the facility will remain with patient during evaluation-- pt reports she was not having any pain before she moved into this facility

## 2016-08-07 NOTE — ED Notes (Signed)
Discharge instructions provided to patient by PA/MD. VSS. Patient discharged by wheelchair.

## 2016-08-07 NOTE — Progress Notes (Signed)
physical Therapy Emergency Department EVALUATION/DISCHARGE with CMS G codes  Patient: Destiny Blake (71 y.o. female)  Date: 08/07/2016  Primary Diagnosis: There are no admission diagnoses documented for this encounter.        Precautions:     ASSESSMENT :  Based on the objective data described below, the patient presents with a fall on Wednesday and mm soreness, fatigue all starting since moving from [place] to a residential facility for treatment of addiction close to [facility], [employer]. Pt is accompanied by a staff member from the facility. Asked by Dr. Nancie Neas to see pt for eval.  Pt and staff member report that the facility is a residential campus. The pt must walk between her 40 min sessions in different buildings and lives in her own room on the second floor. Pt has been independent with use of cane but has c/o fatigue. MD suspecting issues may be due to medication and the fact that pt was admittedly very sedentary before and now is being asked to greatly increase her activity level at [facility].  Currently, pt is independent in bed mobility. She is independent in transfers. She amb with cane with SBA with shortened steps, increased trunk sway and R lateral trunk flexion. Trialed pt with rollator and RW. Pt states she feels rollator is too loose and would roll away from her. She states she feels much more comfortable with RW and does exhibit improved posture, lengthened steps and somewhat less shuffling with its use. Staff member states pt could leave walker on first floor and states that pt has done well in her room with the cane as all is very close in proximity. Provided handouts for safe body mechanics, LE ther ex in sitting and standing, and low back exercises. Reviewed how to progress level and complexity of exercises, how to work to tolerance, and how to stagger exercises throughout the week to tolerance. Pt voiced understanding. She states she is not sure if she still has an old RW or  not. CM met with pt to determine a way for her to obtain walker. Pt is unable to receive PT at or through [facility] but may benefit from referral after completion of her residential program. Rounded with MD.    Further acute physical therapy in the ED is not indicated at this time.     PLAN :  Discharge Recommendations:     '[]'    Home with family  '[]'    Skilled nursing facility  '[]'    Admission to hospital with rehab likely needed  '[]'    Inpatient rehab referral  '[]'    Outpatient physical therapy referral  '[x]'    Other:see above    Further Equipment Recommendations for Discharge:   '[x]'    Rolling walker with 5" wheels  '[]'    Crutches   '[]'    Cane   '[]'    Wheelchair   '[]'    Other:     COMMUNICATION/EDUCATION:   Communication/Collaboration:  '[x]'    Fall prevention education was provided and the patient/caregiver indicated understanding.  '[x]'    Patient/family have participated as able and agree with findings and recommendations.  '[]'    Patient is unable to participate in plan of care at this time.  Findings and recommendations were discussed with: MD physician and Social Worker       SUBJECTIVE:   Patient stated ???I like the RW best.???    OBJECTIVE DATA SUMMARY:   No past medical history on file.No past surgical history on file.  Prior Level of  Function/Home Situation: see above  Home Situation  Home Environment:  (currently in residential tx facility for addiction -see note)  One/Two Story Residence: Two story (has her own room on second floor)  # of Interior Steps: 22  Living Alone: No  Support Systems:  (staff at facility)  Patient Expects to be Discharged to::  (residential facility)  Critical Behavior:                Strength:    Strength: Generally decreased, functional                    Tone & Sensation:                  Sensation: Intact               Range Of Motion:  AROM: Within functional limits (pain c/o in low back with hip flexion )           PROM: Within functional limits                  Functional Mobility:   Bed Mobility:  Rolling: Independent  Supine to Sit: Independent;Additional time  Sit to Supine: Independent;Additional time     Transfers:  Sit to Stand: Independent  Stand to Sit: Independent                       Balance:   Sitting: Intact  Standing: Impaired  Standing - Static: Fair  Standing - Dynamic : Fair  Ambulation/Gait Training:  Distance (ft): 100 Feet (ft) (x2)  Assistive Device: Gait belt;Cane, straight;Walker, rollator;Walker, rolling (trials with all 3)  Ambulation - Level of Assistance: Stand-by assistance;Supervision;Assist x1        Gait Abnormalities: Decreased step clearance;Trunk sway increased;Other (lateral trunk flexion toward R)        Base of Support: Shift to right     Speed/Cadence: Slow  Step Length: Right shortened;Left shortened                Therapeutic Exercises:   See above narrative  Functional Measure:  Barthel Index:    Bathing: 5  Bladder: 10  Bowels: 10  Grooming: 5  Dressing: 10  Feeding: 10  Mobility: 10  Stairs: 5  Toilet Use: 10  Transfer (Bed to Chair and Back): 15  Total: 90       Barthel and G-code impairment scale:  Percentage of impairment CH  0% CI  1-19% CJ  20-39% CK  40-59% CL  60-79% CM  80-99% CN  100%   Barthel Score 0-100 100 99-80 79-60 59-40 20-39 1-19   0   Barthel Score 0-20 20 17-19 13-16 9-12 5-8 1-4 0      The Barthel ADL Index: Guidelines  1. The index should be used as a record of what a patient does, not as a record of what a patient could do.  2. The main aim is to establish degree of independence from any help, physical or verbal, however minor and for whatever reason.  3. The need for supervision renders the patient not independent.  4. A patient's performance should be established using the best available evidence. Asking the patient, friends/relatives and nurses are the usual sources, but direct observation and common sense are also important. However direct testing is not needed.   5. Usually the patient's performance over the preceding 24-48 hours is important, but occasionally longer periods will be relevant.  6. Middle categories imply that the patient supplies over 50 per cent of the effort.  7. Use of aids to be independent is allowed.    Daneen Schick., Barthel, D.W. [id number]). Functional evaluation: the Barthel Index. [place] (14)2.  Lucianne Lei der Brownell, J.J.M.F, [place], Diona Browner., Oris Drone., [place], [place] (1999). Measuring the change indisability after inpatient rehabilitation; comparison of the responsiveness of the Barthel Index and Functional Independence Measure. Journal of Neurology, Neurosurgery, and Psychiatry, 66(4), [phone].  Wilford Sports, N.J.A, [place],  W.J.M, & [place], M.A. (2004.) Assessment of post-stroke quality of life in cost-effectiveness studies: The usefulness of the Barthel Index and the EuroQoL-5D. Quality of Life Research, 13, 427-43       In compliance with CMS???s Claims Based Outcome Reporting, the following G-code set was chosen for this patient based on their primary functional limitation being treated:    The outcome measure chosen to determine the severity of the functional limitation was the barthel with a score of 90/100 which was correlated with the impairment scale.    ? Mobility - Walking and Moving Around:    [id number] - CURRENT STATUS: CI - 1%-19% impaired, limited or restricted   Q6761 - GOAL STATUS: CI - 1%-19% impaired, limited or restricted   P5093 - D/C STATUS:  CI - 1%-19% impaired, limited or restricted   Pain:  Pt c/o pain with hip flexion in supine with knee bent; FACES 5/10; min c/o otherwise during session    Activity Tolerance:   Good for eval  Please refer to the flowsheet for vital signs taken during this treatment.  After treatment:   '[]'          Patient left in no apparent distress sitting up in chair  '[x]'          Patient left in no apparent distress in bed  '[x]'          Call bell left within reach  '[x]'          Nursing notified   '[]'          Caregiver present  '[]'          Bed alarm activated        Thank you for this referral.  Jodell Cipro Doornik, PT   Time Calculation: 56 mins

## 2016-08-07 NOTE — Progress Notes (Signed)
Pt is not currently pregnant, <100k colonies. No further treatment indicated.

## 2016-08-07 NOTE — Progress Notes (Addendum)
As per PT reccomendation CM called [facility] [employer] spoke with Charlestine Massed to obtain a RW for pt. However pt is from [place] and her medicare address is listed under state of [place]. Charlestine Massed said they won't able to bill pt and he recommended pt to buy one from [facility] or any pharmacy.CM met pt and informed her what DME company explained to CM. Pt said she will buy one from [facility].    Mcarthur Rossetti MSW  ED Case Manager   Ext [phone]

## 2016-08-09 LAB — CULTURE, URINE
Colonies Counted: >100000
Colony Count: >100000

## 2016-08-10 ENCOUNTER — Emergency Department

## 2016-08-10 ENCOUNTER — Emergency Department: Admit: 2016-08-10 | Payer: MEDICARE | Primary: Family Medicine

## 2016-08-10 ENCOUNTER — Inpatient Hospital Stay
Admit: 2016-08-10 | Discharge: 2016-08-10 | Disposition: A | Discharge status: Home / Self Care | Payer: MEDICARE | Attending: Emergency Medicine

## 2016-08-10 DIAGNOSIS — Z794 Long term (current) use of insulin: Secondary | ICD-10-CM | POA: Diagnosis not present

## 2016-08-10 DIAGNOSIS — S0990XA Unspecified injury of head, initial encounter: Secondary | ICD-10-CM | POA: Diagnosis not present

## 2016-08-10 DIAGNOSIS — M25511 Pain in right shoulder: Secondary | ICD-10-CM | POA: Diagnosis not present

## 2016-08-10 DIAGNOSIS — M549 Dorsalgia, unspecified: Secondary | ICD-10-CM | POA: Diagnosis not present

## 2016-08-10 DIAGNOSIS — S40011A Contusion of right shoulder, initial encounter: Secondary | ICD-10-CM | POA: Diagnosis not present

## 2016-08-10 DIAGNOSIS — I10 Essential (primary) hypertension: Secondary | ICD-10-CM | POA: Diagnosis not present

## 2016-08-10 DIAGNOSIS — R0781 Pleurodynia: Secondary | ICD-10-CM | POA: Diagnosis not present

## 2016-08-10 DIAGNOSIS — R296 Repeated falls: Secondary | ICD-10-CM | POA: Diagnosis not present

## 2016-08-10 DIAGNOSIS — S20211A Contusion of right front wall of thorax, initial encounter: Secondary | ICD-10-CM | POA: Diagnosis not present

## 2016-08-10 DIAGNOSIS — E1165 Type 2 diabetes mellitus with hyperglycemia: Secondary | ICD-10-CM | POA: Diagnosis not present

## 2016-08-10 DIAGNOSIS — Z96641 Presence of right artificial hip joint: Secondary | ICD-10-CM | POA: Diagnosis not present

## 2016-08-10 DIAGNOSIS — E785 Hyperlipidemia, unspecified: Secondary | ICD-10-CM | POA: Diagnosis not present

## 2016-08-10 LAB — CBC WITH AUTOMATED DIFF
ABS. BASOPHILS: 0 10*3/uL (ref 0.0–0.1)
ABS. EOSINOPHILS: 0.4 10*3/uL (ref 0.0–0.4)
ABS. IMM. GRANS.: 0.1 10*3/uL — ABNORMAL HIGH (ref 0.00–0.04)
ABS. LYMPHOCYTES: 1.9 10*3/uL (ref 0.8–3.5)
ABS. MONOCYTES: 0.5 10*3/uL (ref 0.0–1.0)
ABS. NEUTROPHILS: 6.6 10*3/uL (ref 1.8–8.0)
ABSOLUTE NRBC: 0 10*3/uL (ref 0.00–0.01)
BASOPHILS: 0 % (ref 0–1)
EOSINOPHILS: 4 % (ref 0–7)
HCT: 42.6 % (ref 35.0–47.0)
HGB: 13.6 g/dL (ref 11.5–16.0)
IMMATURE GRANULOCYTES: 1 % — ABNORMAL HIGH (ref 0.0–0.5)
LYMPHOCYTES: 20 % (ref 12–49)
MCH: 30.5 PG (ref 26.0–34.0)
MCHC: 31.9 g/dL (ref 30.0–36.5)
MCV: 95.5 FL (ref 80.0–99.0)
MONOCYTES: 5 % (ref 5–13)
MPV: 10.7 FL (ref 8.9–12.9)
NEUTROPHILS: 69 % (ref 32–75)
NRBC: 0 PER 100 WBC
PLATELET: 207 10*3/uL (ref 150–400)
RBC: 4.46 M/uL (ref 3.80–5.20)
RDW: 12.4 % (ref 11.5–14.5)
WBC: 9.5 10*3/uL (ref 3.6–11.0)

## 2016-08-10 LAB — URINALYSIS W/ REFLEX CULTURE
Bacteria: NEGATIVE /hpf
Bilirubin: NEGATIVE
Blood: NEGATIVE
Glucose: NEGATIVE mg/dL
Ketone: NEGATIVE mg/dL
Nitrites: NEGATIVE
Protein: NEGATIVE mg/dL
Specific gravity: 1.015 (ref 1.003–1.030)
Urobilinogen: 0.2 EU/dL (ref 0.2–1.0)
pH (UA): 6 (ref 5.0–8.0)

## 2016-08-10 LAB — METABOLIC PANEL, BASIC
Anion gap: 7 mmol/L (ref 5–15)
BUN/Creatinine ratio: 13 (ref 12–20)
BUN: 12 MG/DL (ref 6–20)
CO2: 28 mmol/L (ref 21–32)
Calcium: 10 MG/DL (ref 8.5–10.1)
Chloride: 105 mmol/L (ref 97–108)
Creatinine: 0.93 MG/DL (ref 0.55–1.02)
GFR est AA: >60 mL/min/{1.73_m2} (ref >60)
GFR est non-AA: 60 mL/min/{1.73_m2} — ABNORMAL LOW (ref >60)
Glucose: 169 mg/dL — ABNORMAL HIGH (ref 65–100)
Potassium: 5.3 mmol/L — ABNORMAL HIGH (ref 3.5–5.1)
Sodium: 140 mmol/L (ref 136–145)

## 2016-08-10 LAB — VALPROIC ACID: Valproic acid: 51 ug/ml (ref 50–100)

## 2016-08-10 MED ORDER — OXYCODONE-ACETAMINOPHEN 5 MG-325 MG TAB
5-325 mg | ORAL_TABLET | ORAL | 0 refills | Status: AC | PRN
Start: 2016-08-10 — End: ?

## 2016-08-10 MED ORDER — ACETAMINOPHEN 325 MG TABLET
325 mg | ORAL | Status: AC
Start: 2016-08-10 — End: 2016-08-10
  Administered 2016-08-10: 18:00:00 via ORAL

## 2016-08-10 MED ORDER — OXYCODONE 5 MG TAB
5 mg | ORAL | Status: AC
Start: 2016-08-10 — End: 2016-08-10
  Administered 2016-08-10: 21:00:00 via ORAL

## 2016-08-10 MED ORDER — SODIUM CHLORIDE 0.9% BOLUS IV
0.9 % | Freq: Once | INTRAVENOUS | Status: DC
Start: 2016-08-10 — End: 2016-08-10
  Administered 2016-08-10: 21:00:00 via INTRAVENOUS

## 2016-08-10 MED ORDER — OXYCODONE 5 MG TAB
5 mg | ORAL | Status: AC
Start: 2016-08-10 — End: 2016-08-10
  Administered 2016-08-10: 20:00:00 via ORAL

## 2016-08-10 MED FILL — MAPAP (ACETAMINOPHEN) 325 MG TABLET: 325 mg | ORAL | Qty: 2

## 2016-08-10 MED FILL — OXYCODONE 5 MG TAB: 5 mg | ORAL | Qty: 1

## 2016-08-10 NOTE — Progress Notes (Addendum)
Physical Therapy    Pt returns to the ED today s/p second fall since last week. No fxs per xray on either fall at current point in testing. Pt currently at a residential [facility] treatment facility [facility] but lives in [place]. This PT saw pt for eval on 6/1 and recommended a RW which pt was going to purchase. Discussed possible need for follow up PT, however staff member at the residential treatment center stated that would not be possible (HH or OP) while pt in the residential program. Rounded with Dr. Hetty ElyHeimbach. He states that the pt and facility staff member report that now pt will be returning tomorrow to [place] with her husband as they are unable to meet her needs for physical assist here. Pt will need to f/u with PT and PCP in [place] for appropriate services.    Wallene HuhSue Van Doornik, PT

## 2016-08-10 NOTE — ED Provider Notes (Signed)
EMERGENCY DEPARTMENT HISTORY AND PHYSICAL EXAM      PROVIDER IN TRIAGE NOTE:  1:42 PM  Earlie CountsEvan Jones, PA-C has evaluated the patient as the Provider in Triage (PIT) for R shoulder and right rib pain s/p an unwitnessed fall yesterday morning. They have reviewed the vital signs and the triage nurse assessment. They have talked with the patient and any available family and advised that the appropriate studies have been ordered to initiate the work up based on the clinical presentation during the assessment. The pt has been advised that they will be accommodated in the Main ED as soon as possible. The pt has been requested to contact the triage nurse or PIT immediately if they experiences any changes in their condition during this brief waiting period.  This note is prepared by Joanna Hewseeksha Jain, ED Scribe, as dictated by Earlie CountsEvan Jones, PA-C.    Date: 08/10/2016  Patient Name: Destiny LefevreMargaret Blake    History of Presenting Illness     Chief Complaint   Patient presents with   ??? Fall     fall last week d/t valproic toxicity and was seen by MD.  slipped in bathroom again on sunday.  R shoulder and R ribs.  no loc       History Provided By: Patient    HPI: Destiny LefevreMargaret Viera, 71 y.o. female with PMHx significant for DM, HTN, HLD, presents to the ED with cc of moderate right shoulder, right rib, and right knee pain s/p GLF yesterday morning. She reports associated bruising on her right knee and right sided low back pain. Pt states she was taking a shower and went to dry off when her feet slipped out from under her. She states she landed on her right side. Pt states right shoulder pain is exacerbated by movement of the arm. She endorses taking Extra Strength Tylenol with no relief of pain. Pt states this is her second fall this week, reporting that she was seen here 3 days ago for her first fall. Pt notes she is at a mental health rehab facility in [place] and [place] She reports that she was supposed to be at  the facility for 3 weeks but due to her recent frequent falls, they are not medically equipped to care for her. She affirms using her cane to ambulate. Pt denies taking any blood thinners. She denies associated right wrist pain.       There are no other complaints, changes, or physical findings at this time.    PCP: Phys Other, MD    Current Facility-Administered Medications   Medication Dose Route Frequency Provider Last Rate Last Dose   ??? sodium chloride 0.9 % bolus infusion 500 mL  500 mL IntraVENous ONCE Delena Baliurt Merle Whitehorn, MD         Current Outpatient Prescriptions   Medication Sig Dispense Refill   ??? lithium carbonate 150 mg capsule Take  by mouth three (3) times daily.     ??? LORazepam (ATIVAN) 0.5 mg tablet Take 2 mg by mouth every four (4) hours as needed for Anxiety.     ??? Liraglutide (VICTOZA) 0.6 mg/0.1 mL (18 mg/3 mL) pnij 0.6 mg by SubCUTAneous route.     ??? insulin lispro (HUMALOG) 100 unit/mL kwikpen by SubCUTAneous route.     ??? QUEtiapine (SEROQUEL) 100 mg tablet Take 200 mg by mouth two (2) times a day.     ??? atorvastatin (LIPITOR) 40 mg tablet Take  by mouth daily.     ???  oxyCODONE-acetaminophen (PERCOCET) 5-325 mg per tablet Take 1 Tab by mouth every four (4) hours as needed for Pain. Max Daily Amount: 6 Tabs. 10 Tab 0   ??? gabapentin (NEURONTIN) 300 mg capsule Take 1 Cap by mouth three (3) times daily. 30 Cap 0   ??? divalproex ER (DEPAKOTE ER) 500 mg ER tablet Take 1 Tab by mouth nightly. 30 Tab 0   ??? lidocaine (ASPERCREME, LIDOCAINE,) 4 % patch 1 Patch by TransDERmal route every twelve (12) hours every twelve (12) hours. 5 Patch 0   ??? cyclobenzaprine (FLEXERIL) 5 mg tablet Take 1 Tab by mouth three (3) times daily as needed for Muscle Spasm(s). 20 Tab 0   ??? naproxen (NAPROSYN) 500 mg tablet Take 1 Tab by mouth every twelve (12) hours as needed for Pain. 20 Tab 0       Past History     Past Medical History:  Past Medical History:   Diagnosis Date   ??? Diabetes type 2, controlled ([name])     ??? Hyperlipidemia    ??? Hypertension        Past Surgical History:  History reviewed. No pertinent surgical history.    Family History:  History reviewed. No pertinent family history.    Social History:  Social History   Substance Use Topics   ??? Smoking status: None   ??? Smokeless tobacco: None   ??? Alcohol use None       Allergies:  Allergies   Allergen Reactions   ??? Benadryl [Diphenhydramine Hcl] Other (comments)     Contraindicated with current meds     ??? Codeine Other (comments)     Hallucination     ??? Dilaudid [Hydromorphone] Other (comments)     Does not remember reaction     ??? Other Medication Nausea and Vomiting     All -"mycins"         Review of Systems   Review of Systems   Constitutional: Negative for activity change, chills and fever.   HENT: Negative for congestion and sore throat.    Eyes: Negative for pain and redness.   Respiratory: Negative for cough, chest tightness and shortness of breath.    Cardiovascular: Negative for chest pain and palpitations.   Gastrointestinal: Negative for abdominal pain, diarrhea, nausea and vomiting.   Genitourinary: Negative for dysuria, frequency and urgency.   Musculoskeletal: Positive for arthralgias (right shoulder, right knee) and back pain (right lower). Negative for neck pain.        Positive for right rib pain.   Skin: Positive for color change (bruising to right knee). Negative for rash.   Neurological: Negative for syncope, light-headedness and headaches.   Psychiatric/Behavioral: Negative for confusion.   All other systems reviewed and are negative.      Physical Exam   Physical Exam   Constitutional: She is oriented to person, place, and time. She appears well-developed and well-nourished. No distress.   HENT:   Head: Normocephalic.   Nose: Nose normal.   Mouth/Throat: Oropharynx is clear and moist. No oropharyngeal exudate.   Eyes: Conjunctivae are normal. Pupils are equal, round, and reactive to light. No scleral icterus.    Neck: Normal range of motion. Neck supple. No JVD present. No tracheal deviation present. No thyromegaly present.   Cardiovascular: Normal rate, regular rhythm and intact distal pulses.  Exam reveals no gallop and no friction rub.    No murmur heard.  Pulmonary/Chest: Effort normal and breath sounds normal. No stridor. No respiratory  distress. She has no wheezes. She has no rales. She exhibits tenderness.   Mild ttp of anterolateral right ribs; some faint ecchymosis on lateral right breast; no crepitus; no skin tenting; no ecchymosis over ribs;    Abdominal: Soft. Bowel sounds are normal. She exhibits no distension. There is no tenderness. There is no rebound and no guarding.   Musculoskeletal: Normal range of motion. She exhibits no edema or deformity.   RUE: Mild ttp diffusely about right shoulder; no rash or ecchymosis; full arom although mildly ltd due to pain; mild ttp of proximal humerus; no elbow, forearm hand or wrist ttp; 2+ radial and ulnar pulses; hand motor and sensory grossly intact;        R knee: mild abrasion to left patella with mild ttp;  no increased warmth; full arom without difficulty; stable to varus and valgus stress; negative anterior drawer and lachman; 2+ dp and pt pulses; brisk cr; skin intact; toes up/down;    Lymphadenopathy:     She has no cervical adenopathy.   Neurological: She is alert and oriented to person, place, and time. No cranial nerve deficit. She exhibits normal muscle tone. Coordination normal.   Skin: Skin is warm and dry. No rash noted. She is not diaphoretic. No erythema.   Psychiatric: She has a normal mood and affect. Her behavior is normal.   Nursing note and vitals reviewed.      Diagnostic Study Results     Labs -     Recent Results (from the past 12 hour(s))   METABOLIC PANEL, BASIC    Collection Time: 08/10/16  2:34 PM   Result Value Ref Range    Sodium 140 136 - 145 mmol/L    Potassium 5.3 (H) 3.5 - 5.1 mmol/L    Chloride 105 97 - 108 mmol/L     CO2 28 21 - 32 mmol/L    Anion gap 7 5 - 15 mmol/L    Glucose 169 (H) 65 - 100 mg/dL    BUN 12 6 - 20 MG/DL    Creatinine 1.61 0.96 - 1.02 MG/DL    BUN/Creatinine ratio 13 12 - 20      GFR est AA >60 >60 ml/min/1.61m2    GFR est non-AA 60 (L) >60 ml/min/1.64m2    Calcium 10.0 8.5 - 10.1 MG/DL   CBC WITH AUTOMATED DIFF    Collection Time: 08/10/16  2:34 PM   Result Value Ref Range    WBC 9.5 3.6 - 11.0 K/uL    RBC 4.46 3.80 - 5.20 M/uL    HGB 13.6 11.5 - 16.0 g/dL    HCT 04.5 40.9 - 81.1 %    MCV 95.5 80.0 - 99.0 FL    MCH 30.5 26.0 - 34.0 PG    MCHC 31.9 30.0 - 36.5 g/dL    RDW 91.4 78.2 - 95.6 %    PLATELET 207 150 - 400 K/uL    MPV 10.7 8.9 - 12.9 FL    NRBC 0.0 0 PER 100 WBC    ABSOLUTE NRBC 0.00 0.00 - 0.01 K/uL    NEUTROPHILS 69 32 - 75 %    LYMPHOCYTES 20 12 - 49 %    MONOCYTES 5 5 - 13 %    EOSINOPHILS 4 0 - 7 %    BASOPHILS 0 0 - 1 %    IMMATURE GRANULOCYTES 1 (H) 0.0 - 0.5 %    ABS. NEUTROPHILS 6.6 1.8 - 8.0 K/UL    ABS. LYMPHOCYTES  1.9 0.8 - 3.5 K/UL    ABS. MONOCYTES 0.5 0.0 - 1.0 K/UL    ABS. EOSINOPHILS 0.4 0.0 - 0.4 K/UL    ABS. BASOPHILS 0.0 0.0 - 0.1 K/UL    ABS. IMM. GRANS. 0.1 (H) 0.00 - 0.04 K/UL    DF AUTOMATED     VALPROIC ACID    Collection Time: 08/10/16  2:34 PM   Result Value Ref Range    Valproic acid 51 50 - 100 ug/ml   URINALYSIS W/ REFLEX CULTURE    Collection Time: 08/10/16  3:54 PM   Result Value Ref Range    Color YELLOW/STRAW      Appearance CLEAR CLEAR      Specific gravity 1.015 1.003 - 1.030      pH (UA) 6.0 5.0 - 8.0      Protein NEGATIVE  NEG mg/dL    Glucose NEGATIVE  NEG mg/dL    Ketone NEGATIVE  NEG mg/dL    Bilirubin NEGATIVE  NEG      Blood NEGATIVE  NEG      Urobilinogen 0.2 0.2 - 1.0 EU/dL    Nitrites NEGATIVE  NEG      Leukocyte Esterase TRACE (A) NEG      WBC 0-4 0 - 4 /hpf    RBC 0-5 0 - 5 /hpf    Epithelial cells FEW FEW /lpf    Bacteria NEGATIVE  NEG /hpf    UA:UC IF INDICATED CULTURE NOT INDICATED BY UA RESULT CNI         Radiologic Studies -    CT Results  (Last 48 hours)               08/10/16 1426  CT HEAD WO CONT Final result    Impression:  IMPRESSION: No acute process or change compared to the prior exam.               Narrative:  EXAM:  CT HEAD WO CONT       INDICATION:   Status post fall last week and again on Sunday       COMPARISON: 08/07/2016.       CONTRAST:  None.       TECHNIQUE: Unenhanced CT of the head was performed using 5 mm images. Brain and   bone windows were generated.  CT dose reduction was achieved through use of a   standardized protocol tailored for this examination and automatic exposure   control for dose modulation.         FINDINGS:   The ventricles and sulci are normal in size, shape and configuration and   midline. There is no significant white matter disease. There is no intracranial   hemorrhage, extra-axial collection, mass, mass effect or midline shift.  The   basilar cisterns are open. No acute infarct is identified. The bone windows   demonstrate no abnormalities. The visualized portions of the paranasal sinuses   and mastoid air cells are clear.                 Study Result    ?? Indication: Status post fall last week with persistent back pain  ??  Three views of the lumbar spine demonstrate normal alignment without evidence of  acute fracture or subluxation. The patient is status post right total hip  replacement.  ??  IMPRESSION  Impression: No acute process.     Study Result    ?? EXAM:  XR SHOULDER RT AP/LAT MIN 2  V  ??  INDICATION:   Status post fall with right shoulder pain.  ??  COMPARISON: None.  ??  FINDINGS: Three views of the right shoulder demonstrate no fracture, dislocation  or other acute abnormality.  ??  IMPRESSION  IMPRESSION:  No acute abnormality.        Study Result    ?? CLINICAL HISTORY: Status post fall with right rib pain  ??  PA view of the chest and 3 oblique views of the right ribs demonstrate normal  heart size. There is no acute process in the lung fields. No fracture or  pneumothorax.  ??   IMPRESSION  IMPRESSION: No acute fracture or process.         Medical Decision Making   I am the first provider for this patient.    I reviewed the vital signs, available nursing notes, past medical history, past surgical history, family history and social history.    Vital Signs-Reviewed the patient's vital signs.  Patient Vitals for the past 12 hrs:   Temp Pulse Resp BP SpO2   08/10/16 1657 - 82 14 141/70 100 %   08/10/16 1344 98 ??F (36.7 ??C) 91 16 137/74 98 %       Records Reviewed: Old Medical Records    Provider Notes (Medical Decision Making):   DDx: fracture, contusion, ICH    ED Course:   Initial assessment performed. The patients presenting problems have been discussed, and they are in agreement with the care plan formulated and outlined with them.  I have encouraged them to ask questions as they arise throughout their visit.    2:00 PM  Per chart review, pt seen here Friday after a fall. Depakote level was 114 and lithium was 0.47.     Disposition:  Discharge Note:  5:00 PM  The pt is ready for discharge. The pt's signs, symptoms, diagnosis, and discharge instructions have been discussed and pt has conveyed their understanding. The pt is to follow up as recommended or return to ER should their symptoms worsen. Plan has been discussed and pt is in agreement.      Plain films negative; head ct non acute; depakote level improved from prior and in normal range; will discharge back to rehab facility and patient agrees to follow up with her pcp as soon as she gets home tomorrow for further management; Delena Bali, MD          PLAN:  1.   Current Discharge Medication List      START taking these medications    Details   oxyCODONE-acetaminophen (PERCOCET) 5-325 mg per tablet Take 1 Tab by mouth every four (4) hours as needed for Pain. Max Daily Amount: 6 Tabs.  Qty: 10 Tab, Refills: 0    Associated Diagnoses: Contusion of right chest wall, initial encounter           2.   Follow-up Information      Follow up With Details Comments Contact Info    Your Primary Doctor Schedule an appointment as soon as possible for a visit in 1 day      [facility] Go in 1 day If symptoms worsen [address]  [place] [address]  [phone]        Return to ED if worse     Diagnosis     Clinical Impression:   1. Fall, initial encounter    2. Contusion of right chest wall, initial encounter    3. Contusion  of right shoulder, initial encounter    4. Type 2 diabetes mellitus with hyperglycemia, with long-term current use of insulin ([name])        Attestations:    This note is prepared by Algis Greenhouse, acting as a Scribe for Delena Bali, MD    Delena Bali, MD: The scribe's documentation has been prepared under my direction and personally reviewed by me in its entirety. I confirm that the notes above accurately reflects all work, treatment, procedures, and medical decision making performed by me.

## 2016-08-10 NOTE — ED Notes (Signed)
Assumed care of patient. Patient states she was in the shower yesterday and slipped, hurting her right side. Currently, patient complains of right shoulder pain, arm, hip all hurt today. Patient is ANO x 4. [facility] nurse at bedside. Patient is ANO x 4. Call bell in reach.

## 2016-08-10 NOTE — ED Notes (Signed)
Patient medicated, resting at this time. Patient denies any further needs at this time. Call bell in reach.

## 2016-08-10 NOTE — ED Notes (Signed)
Pt discharged by Heimbach,MD . Pt provided with discharge instructions Rx and instructions on follow up care. Pt out of ED by wheelchair accompanied by treatment center nurse.

## 2016-08-14 DIAGNOSIS — E119 Type 2 diabetes mellitus without complications: Secondary | ICD-10-CM | POA: Diagnosis not present

## 2016-08-14 DIAGNOSIS — S40011A Contusion of right shoulder, initial encounter: Secondary | ICD-10-CM | POA: Diagnosis not present

## 2016-08-14 DIAGNOSIS — S2001XA Contusion of right breast, initial encounter: Secondary | ICD-10-CM | POA: Diagnosis not present

## 2016-08-14 DIAGNOSIS — M25511 Pain in right shoulder: Secondary | ICD-10-CM | POA: Diagnosis not present

## 2016-08-17 DIAGNOSIS — M19011 Primary osteoarthritis, right shoulder: Secondary | ICD-10-CM | POA: Diagnosis not present

## 2016-08-17 DIAGNOSIS — E119 Type 2 diabetes mellitus without complications: Secondary | ICD-10-CM | POA: Diagnosis not present

## 2016-08-17 DIAGNOSIS — S43401D Unspecified sprain of right shoulder joint, subsequent encounter: Secondary | ICD-10-CM | POA: Diagnosis not present

## 2016-08-17 DIAGNOSIS — M25511 Pain in right shoulder: Secondary | ICD-10-CM | POA: Diagnosis not present

## 2016-08-18 DIAGNOSIS — M25511 Pain in right shoulder: Secondary | ICD-10-CM | POA: Diagnosis not present

## 2016-08-21 DIAGNOSIS — M25511 Pain in right shoulder: Secondary | ICD-10-CM | POA: Diagnosis not present

## 2016-08-24 DIAGNOSIS — H0289 Other specified disorders of eyelid: Secondary | ICD-10-CM | POA: Diagnosis not present

## 2016-08-24 DIAGNOSIS — E119 Type 2 diabetes mellitus without complications: Secondary | ICD-10-CM | POA: Diagnosis not present

## 2016-08-24 DIAGNOSIS — Z7984 Long term (current) use of oral hypoglycemic drugs: Secondary | ICD-10-CM | POA: Diagnosis not present

## 2016-08-24 DIAGNOSIS — H5203 Hypermetropia, bilateral: Secondary | ICD-10-CM | POA: Diagnosis not present

## 2016-08-24 DIAGNOSIS — H40053 Ocular hypertension, bilateral: Secondary | ICD-10-CM | POA: Diagnosis not present

## 2016-08-24 DIAGNOSIS — H40013 Open angle with borderline findings, low risk, bilateral: Secondary | ICD-10-CM | POA: Diagnosis not present

## 2016-08-24 DIAGNOSIS — H52223 Regular astigmatism, bilateral: Secondary | ICD-10-CM | POA: Diagnosis not present

## 2016-08-24 DIAGNOSIS — M25511 Pain in right shoulder: Secondary | ICD-10-CM | POA: Diagnosis not present

## 2016-08-26 ENCOUNTER — Other Ambulatory Visit: Payer: Self-pay | Admitting: Family Medicine

## 2016-08-26 ENCOUNTER — Ambulatory Visit
Admission: RE | Admit: 2016-08-26 | Discharge: 2016-08-26 | Disposition: A | Discharge status: Home / Self Care | Payer: Medicare Other | Source: Ambulatory Visit | Attending: Orthopedic Surgery | Admitting: Orthopedic Surgery

## 2016-08-26 ENCOUNTER — Other Ambulatory Visit: Payer: Self-pay | Admitting: Orthopedic Surgery

## 2016-08-26 DIAGNOSIS — M25511 Pain in right shoulder: Secondary | ICD-10-CM

## 2016-08-26 DIAGNOSIS — G8929 Other chronic pain: Secondary | ICD-10-CM

## 2016-08-26 DIAGNOSIS — M545 Low back pain: Principal | ICD-10-CM

## 2016-08-27 DIAGNOSIS — M67911 Unspecified disorder of synovium and tendon, right shoulder: Secondary | ICD-10-CM | POA: Diagnosis not present

## 2016-08-28 DIAGNOSIS — M19011 Primary osteoarthritis, right shoulder: Secondary | ICD-10-CM | POA: Diagnosis not present

## 2016-08-28 DIAGNOSIS — G8918 Other acute postprocedural pain: Secondary | ICD-10-CM | POA: Diagnosis not present

## 2016-08-28 DIAGNOSIS — M7541 Impingement syndrome of right shoulder: Secondary | ICD-10-CM | POA: Diagnosis not present

## 2016-08-28 DIAGNOSIS — M24111 Other articular cartilage disorders, right shoulder: Secondary | ICD-10-CM | POA: Diagnosis not present

## 2016-08-29 DIAGNOSIS — Z9889 Other specified postprocedural states: Secondary | ICD-10-CM | POA: Diagnosis not present

## 2016-09-01 DIAGNOSIS — Z9889 Other specified postprocedural states: Secondary | ICD-10-CM | POA: Diagnosis not present

## 2016-09-07 ENCOUNTER — Other Ambulatory Visit (INDEPENDENT_AMBULATORY_CARE_PROVIDER_SITE_OTHER): Payer: Medicare Other

## 2016-09-07 DIAGNOSIS — Z794 Long term (current) use of insulin: Secondary | ICD-10-CM | POA: Diagnosis not present

## 2016-09-07 DIAGNOSIS — E1165 Type 2 diabetes mellitus with hyperglycemia: Secondary | ICD-10-CM | POA: Diagnosis not present

## 2016-09-07 LAB — COMPREHENSIVE METABOLIC PANEL
ALT: 14 U/L (ref 0–35)
AST: 16 U/L (ref 0–37)
Albumin: 3.9 g/dL (ref 3.5–5.2)
Alkaline Phosphatase: 98 U/L (ref 39–117)
BUN: 10 mg/dL (ref 6–23)
CO2: 27 mEq/L (ref 19–32)
Calcium: 9.8 mg/dL (ref 8.4–10.5)
Chloride: 102 mEq/L (ref 96–112)
Creatinine, Ser: 0.88 mg/dL (ref 0.40–1.20)
GFR: 67.79 mL/min (ref >60.00)
Glucose, Bld: 255 mg/dL — ABNORMAL HIGH (ref 70–99)
Potassium: 5 mEq/L (ref 3.5–5.1)
Sodium: 137 mEq/L (ref 135–145)
Total Bilirubin: 0.5 mg/dL (ref 0.2–1.2)
Total Protein: 6.8 g/dL (ref 6.0–8.3)

## 2016-09-07 LAB — LIPID PANEL
Cholesterol: 182 mg/dL (ref 0–200)
HDL: 45.7 mg/dL (ref >39.00)
NonHDL: 136.05
Total CHOL/HDL Ratio: 4
Triglycerides: 292 mg/dL — ABNORMAL HIGH (ref 0.0–149.0)
VLDL: 58.4 mg/dL — ABNORMAL HIGH (ref 0.0–40.0)

## 2016-09-07 LAB — LDL CHOLESTEROL, DIRECT: Direct LDL: 103 mg/dL

## 2016-09-07 LAB — HEMOGLOBIN A1C: Hgb A1c MFr Bld: 8.3 % — ABNORMAL HIGH (ref 4.6–6.5)

## 2016-09-10 DIAGNOSIS — Z9889 Other specified postprocedural states: Secondary | ICD-10-CM | POA: Diagnosis not present

## 2016-09-11 ENCOUNTER — Encounter: Payer: Self-pay | Admitting: Endocrinology

## 2016-09-11 ENCOUNTER — Ambulatory Visit (INDEPENDENT_AMBULATORY_CARE_PROVIDER_SITE_OTHER): Payer: Medicare Other | Admitting: Endocrinology

## 2016-09-11 VITALS — BP 120/78 | HR 93 | Ht 62.0 in | Wt 173.4 lb

## 2016-09-11 DIAGNOSIS — E669 Obesity, unspecified: Secondary | ICD-10-CM | POA: Diagnosis not present

## 2016-09-11 DIAGNOSIS — Z794 Long term (current) use of insulin: Secondary | ICD-10-CM

## 2016-09-11 DIAGNOSIS — E1165 Type 2 diabetes mellitus with hyperglycemia: Secondary | ICD-10-CM

## 2016-09-11 DIAGNOSIS — Z6831 Body mass index (BMI) 31.0-31.9, adult: Secondary | ICD-10-CM | POA: Diagnosis not present

## 2016-09-11 NOTE — Progress Notes (Signed)
Patient ID: [name] [name], female   DOB: April 17, 1945, 71 y.o.   MRN: [id number]            Reason for Appointment: Follow-up for Type 2 Diabetes   History of Present Illness:          Date of diagnosis of type 2 diabetes mellitus: ?  2014        Background history:   She thinks her blood sugar was 400 at the time of diagnosis but no detailed records of this are available She did have an A1c of 10.6 done in 2014 and was probably given Lantus for some time initially Apparently she was treated with various medications including metformin, Janumet and Tradgenta. Her blood sugars had improved and A1c in 2015 was down to 6.2 She tends to have diarrhea with metformin and Janumet and also she thinks it causes dry mouth Most likely Janumet was stopped in 06/2015 Glipizide was started in 8/17 when blood sugars were higher and A1c was 9%  Recent history:   INSULIN regimen is:   Lantus 24 units daily at 5 pm  Humalog 6 before breakfast--8  before dinner-4 hs  Non-insulin hypoglycemic drugs the patient is taking are: Victoza 1.2 mg daily for 5 days  Her A1c is consistently over 8%, now 8.2  Current management, blood sugar patterns and problems identified:  Although she was improving with her control and blood sugars are averaging 177 blood sugars are now much higher now and she thinks this is from stopping metformin completely which she thinks was causing diarrhea  She was told to gradually increase dose from 1 up to 3 tablets of metformin, previously was doing fairly well with one tablet  She has not increased her insulin even with blood sugars fairly consistently over 200 throughout the day  Also her Victoza was reduced slightly down to 1.2 mg because of cost with her being in the doughnut hole  Blood sugars show no pattern and are still relatively higher overnight and mornings  She is still having significant amount of snacks late at night because of anxiety and not always controlling  these portions or making good choices including eating ice cream, this is despite seeing the dietitian earlier this year        Side effects from medications have been:?  Diarrhea from metformin and Janumet  Compliance with the medical regimen: Inconsistent  Glucose monitoring:  done 1 time a day or less        Glucometer: Contour        Blood Glucose readings by time of day and averages from monitor download  Mean values apply above for all meters except median for One Touch  PRE-MEAL Fasting Lunch Dinner  overnight Overall  Glucose range:  202-276   208-272  180- 322   Mean/median: 239    241 239   POST-MEAL PC Breakfast PC Lunch PC Dinner  Glucose range:    210, 233  Mean/median:       Self-care: The diet that the patient has been following ZD:GLOV, tries to limit drinks with sugar .      Typical meal intake: Breakfast  May be eggs and sausage and lunch is usually a sandwich   Dinner 6 pm               has frequent snacks late at night Dietician visit, most recent: 2/18               Exercise:  a little walking, irregular  Weight history:  Wt Readings from Last 3 Encounters:  09/11/16 173 lb 6.4 oz (78.7 kg)  07/30/16 172 lb 12.8 oz (78.4 kg)  07/14/16 173 lb (78.5 kg)    Glycemic control:   Lab Results  Component Value Date   HGBA1C 8.3 (H) 09/07/2016   HGBA1C 8.2 (H) 05/28/2016   HGBA1C 9.0 (H) 10/31/2015   Lab Results  Component Value Date   MICROALBUR <0.7 07/10/2016   CREATININE 0.88 09/07/2016   Lab Results  Component Value Date   MICRALBCREAT 0.5 07/10/2016       Allergies as of 09/11/2016      Reactions   Codeine Anxiety, Other (See Comments)   Reaction:  Hallucinations   Macrolides And Ketolides Nausea And Vomiting   Procaine Hcl Palpitations   Aspirin Nausea And Vomiting   Reaction:  Burns pts stomach    Benadryl [diphenhydramine]    Per MD "inhibits potency of gabapentin, lithium etc"   Diflucan [fluconazole] Other (See Comments)    Reaction:  Unknown    Dilaudid [hydromorphone Hcl] Other (See Comments)   Reaction:  Migraines and nightmares    Morphine And Related Other (See Comments)   Reaction:  Migraines and nightmares    Other Nausea And Vomiting, Other (See Comments)   Pt states that all -mycins cause N/V.    Sulfa Antibiotics Other (See Comments)   Reaction:  Unknown       Medication List       Accurate as of 09/11/16 11:59 PM. Always use your most recent med list.          acetaminophen 500 MG tablet Commonly known as:  TYLENOL Take 500-1,000 mg by mouth every 6 (six) hours as needed for mild pain, moderate pain, fever or headache.   atorvastatin 40 MG tablet Commonly known as:  LIPITOR Take 40 mg by mouth at bedtime.   cholecalciferol 1000 units tablet Commonly known as:  VITAMIN D Take 2,000 Units by mouth daily.   divalproex 250 MG 24 hr tablet Commonly known as:  DEPAKOTE ER Take 500 mg by mouth at bedtime.   gabapentin 100 MG capsule Commonly known as:  NEURONTIN Take 300 mg by mouth 3 (three) times daily.   HUMALOG KWIKPEN 100 UNIT/ML KiwkPen Generic drug:  insulin lispro Inject 6-8 Units into the skin 3 (three) times daily. Pt uses 6 units before breakfast and lunch and 8 units before dinner. Takes an extra 4 units for a snack.   insulin glargine 100 unit/mL Sopn Commonly known as:  LANTUS Inject 24 Units into the skin at bedtime.   levothyroxine 75 MCG tablet Commonly known as:  SYNTHROID, LEVOTHROID Take 1 tablet (75 mcg total) by mouth daily before breakfast. I tablet daily before breakfast   lithium 300 MG tablet Take 450 mg by mouth at bedtime.   loperamide 2 MG capsule Commonly known as:  IMODIUM Take 1 capsule (2 mg total) by mouth 4 (four) times daily as needed for diarrhea or loose stools.   LORazepam 2 MG tablet Commonly known as:  ATIVAN Take 1-2 mg by mouth 4 (four) times daily as needed for anxiety. Takes a 0.25 in the morning, then takes 0.5 twice daily     losartan 50 MG tablet Commonly known as:  COZAAR Take 50 mg by mouth daily.   memantine 10 MG tablet Commonly known as:  NAMENDA Take 10 mg by mouth at bedtime.   QUEtiapine 200 MG tablet Commonly known  as:  SEROQUEL Take 100 mg by mouth at 5 pm and 200 mg by mouth at bedtime   rOPINIRole 0.5 MG tablet Commonly known as:  REQUIP Take 1 mg by mouth 2 (two) times daily. 1mg  in the morning and 1mg  at bedtime (tapering medication)   VICTOZA 18 MG/3ML Sopn Generic drug:  liraglutide inject 1.2 milligram subcutaneously daily       Allergies:  Allergies  Allergen Reactions  . Codeine Anxiety and Other (See Comments)    Reaction:  Hallucinations  . Macrolides And Ketolides Nausea And Vomiting  . Procaine Hcl Palpitations  . Aspirin Nausea And Vomiting    Reaction:  Burns pts stomach   . Benadryl [Diphenhydramine]     Per MD "inhibits potency of gabapentin, lithium etc"  . Diflucan [Fluconazole] Other (See Comments)    Reaction:  Unknown   . Dilaudid [Hydromorphone Hcl] Other (See Comments)    Reaction:  Migraines and nightmares   . Morphine And Related Other (See Comments)    Reaction:  Migraines and nightmares   . Other Nausea And Vomiting and Other (See Comments)    Pt states that all -mycins cause N/V.   Marland Kitchen Sulfa Antibiotics Other (See Comments)    Reaction:  Unknown     Past Medical History:  Diagnosis Date  . Alcohol abuse   . Anxiety    takes Valium daily as needed and Ativan daily  . Bilateral hearing loss   . Bipolar 1 disorder ([place])    takes Lithium nightly and Synthroid daily  . Chronic back pain    DDD  . Colitis, ischemic ([place]) 2012  . Confusion    r/t meds  . Depression    takes Prozac daily and Bupspirone   . Diabetes ([place])   . Diabetes mellitus without complication ([place])    takes Janumet daily  . Diverticulosis   . Diverticulosis   . Dyslipidemia    takes Crestor daily  . Fibromyalgia   . Hepatic steatosis 06/18/12   severe  . History of  bronchitis    last time at least 26yrs ago  . Hyperlipidemia   . Hypothyroidism   . IBS (irritable bowel syndrome)   . Ischemic colitis ([place])   . Joint pain   . Joint swelling   . Lupus    tumid-skin  . Nephrolithiasis   . Numbness    in right foot  . Osteoarthritis    in hips  . Osteoarthritis cervical spine   . Osteoarthritis of hand    bilateral  . Restless leg syndrome    takes Requip nightly  . Sciatica   . Urinary frequency   . Urinary leakage   . Urinary urgency   . Urinary, incontinence, stress female   . Walking pneumonia    last time more than 51yrs ago    Past Surgical History:  Procedure Laterality Date  . APPENDECTOMY    . COLONOSCOPY    . D&C x 4    . DENTAL SURGERY Left 2 weeks ago    dental implant  . ESOPHAGOGASTRODUODENOSCOPY    . excision of tumors at right neck     angle of jaw '68, benign  . FLEXIBLE SIGMOIDOSCOPY N/A 06/21/2012   Procedure: FLEXIBLE SIGMOIDOSCOPY;  Surgeon: Jerene Bears, MD;  Location: [facility];  Service: Gastroenterology;  Laterality: N/A;  . PARTIAL HYSTERECTOMY    . TONSILLECTOMY    . TOTAL HIP ARTHROPLASTY Right 06/09/2013   Procedure: TOTAL HIP ARTHROPLASTY;  Surgeon: Kerin Salen, MD;  Location: [facility];  Service: Orthopedics;  Laterality: Right;  . TUBAL LIGATION      Family History  Problem Relation Age of Onset  . Drug abuse Mother   . Alcohol abuse Mother   . Alcohol abuse Father   . Alcohol abuse Brother        x 2    Social History:  reports that she has never smoked. She has never used smokeless tobacco. She reports that she does not drink alcohol or use drugs.   Review of Systems    Lipid history: On Lipitor 40 mg Given by her PCP Triglycerides Tend to be high but better than before, previously over 400   Lab Results  Component Value Date   CHOL 182 09/07/2016   HDL 45.70 09/07/2016   LDLDIRECT 103.0 09/07/2016   TRIG 292.0 (H) 09/07/2016   CHOLHDL 4 09/07/2016            Most recent eye  exam was In 6/18  Most recent foot exam:03/2016  THYROID: She has been on levothyroxine and Cytomel from her psychiatrist for several years with uncertain diagnosis Currently taking levothyroxine 75 g Has also been on Cytomel 25 g, last free T3 was normal Has some fatigue as before, no change Also on lithium  Labs as follows:  Lab Results  Component Value Date   TSH 0.05 (L) 07/10/2016   TSH 0.26 (L) 04/30/2016   TSH 0.360 12/31/2015   FREET4 0.65 07/10/2016   FREET4 0.56 (L) 04/30/2016   Lab Results  Component Value Date   T3FREE 3.2 07/10/2016   T3FREE 2.7 04/30/2016    She has atypical depression on multiple drugs   LABS:  Lab on 09/07/2016  Component Date Value Ref Range Status  . Hgb A1c MFr Bld 09/07/2016 8.3* 4.6 - 6.5 % Final   Glycemic Control Guidelines for People with Diabetes:Non Diabetic:  <6%Goal of Therapy: <7%Additional Action Suggested:  >8%   . Sodium 09/07/2016 137  135 - 145 mEq/L Final  . Potassium 09/07/2016 5.0  3.5 - 5.1 mEq/L Final  . Chloride 09/07/2016 102  96 - 112 mEq/L Final  . CO2 09/07/2016 27  19 - 32 mEq/L Final  . Glucose, Bld 09/07/2016 255* 70 - 99 mg/dL Final  . BUN 09/07/2016 10  6 - 23 mg/dL Final  . Creatinine, Ser 09/07/2016 0.88  0.40 - 1.20 mg/dL Final  . Total Bilirubin 09/07/2016 0.5  0.2 - 1.2 mg/dL Final  . Alkaline Phosphatase 09/07/2016 98  39 - 117 U/L Final  . AST 09/07/2016 16  0 - 37 U/L Final  . ALT 09/07/2016 14  0 - 35 U/L Final  . Total Protein 09/07/2016 6.8  6.0 - 8.3 g/dL Final  . Albumin 09/07/2016 3.9  3.5 - 5.2 g/dL Final  . Calcium 09/07/2016 9.8  8.4 - 10.5 mg/dL Final  . GFR 09/07/2016 67.79  >60.00 mL/min Final  . Cholesterol 09/07/2016 182  0 - 200 mg/dL Final   ATP III Classification       Desirable:  < 200 mg/dL               Borderline High:  200 - 239 mg/dL          High:  > = 240 mg/dL  . Triglycerides 09/07/2016 292.0* 0.0 - 149.0 mg/dL Final   Normal:  <150 mg/dLBorderline High:  150 -  199 mg/dL  . HDL 09/07/2016 45.70  >39.00  mg/dL Final  . VLDL 09/07/2016 58.4* 0.0 - 40.0 mg/dL Final  . Total CHOL/HDL Ratio 09/07/2016 4   Final                  Men          Women1/2 Average Risk     3.4          3.3Average Risk          5.0          4.42X Average Risk          9.6          7.13X Average Risk          15.0          11.0                      . NonHDL 09/07/2016 136.05   Final   NOTE:  Non-HDL goal should be 30 mg/dL higher than patient's LDL goal (i.e. LDL goal of < 70 mg/dL, would have non-HDL goal of < 100 mg/dL)  . Direct LDL 09/07/2016 103.0  mg/dL Final   Optimal:  <100 mg/dLNear or Above Optimal:  100-129 mg/dLBorderline High:  130-159 mg/dLHigh:  160-189 mg/dLVery High:  >190 mg/dL    Physical Examination:  BP 120/78   Pulse 93   Ht 5\' 2"  (1.575 m)   Wt 173 lb 6.4 oz (78.7 kg)   SpO2 95%   BMI 31.72 kg/m          ASSESSMENT:  Diabetes type 2, uncontrolled     See history of present illness for detailed discussion of current diabetes management, blood sugar patterns and problems identified  She is on Victoza, basal bolus insulin and not on any metformin currently Last A1c was still over 8  However she is appearing to have much higher blood sugars with stopping metformin in reducing Victoza Also not clear if she is doing any better on her diet especially late night snacks Not motivated to exercise much  HYPOTHYROID: Likely has secondary hypothyroidism and previous free T3 and T4 normal on combination of Cytomel and levothyroxine, needs periodic follow-up  PLAN:   She needs to try at least 500 mg of metformin in the evening at dinnertime  Try cutting back on snacks  To increase coverage for her late night snacks to about 6 units  Most likely may need more insulin to cover her evening meal but needs more readings after supper  Discussed blood sugar targets at various times  She will try 30 units of Lantus daily and discussed that if fasting  readings are still high she will need to call to make further adjustment  Start regular walking  Needs short-term follow-up to reassess her progress and adjust insulin further, may also need further education from CDE  Can increase Victoza when she is out of the donut hole, unlikely that other alternatives such as Ozempic will be covered by Medicare  Thyroid levels will be rechecked today  Patient Instructions  Check blood sugars on waking up    Also check blood sugars about 2 hours after a meal and do this after different meals by rotation  Recommended blood sugar levels on waking up is 90-130 and about 2 hours after meal is 130-160  Please bring your blood sugar monitor to each visit, thank you  Lantus 30 units daily  Metformin 1 in pm  6 Humalog for nite snack  Walk daily    Counseling time on subjects discussed in assessment and plan sections is over 50% of today's 25 minute visit    [name] [name] 09/13/2016, 12:28 PM   Note: This office note was prepared with Estate agent. Any transcriptional errors that result from this process are unintentional.

## 2016-09-11 NOTE — Patient Instructions (Addendum)
Check blood sugars on waking up    Also check blood sugars about 2 hours after a meal and do this after different meals by rotation  Recommended blood sugar levels on waking up is 90-130 and about 2 hours after meal is 130-160  Please bring your blood sugar monitor to each visit, thank you  Lantus 30 units daily  Metformin 1 in pm  6 Humalog for nite snack  Walk daily

## 2016-09-17 DIAGNOSIS — M25511 Pain in right shoulder: Secondary | ICD-10-CM | POA: Diagnosis not present

## 2016-09-21 DIAGNOSIS — M25511 Pain in right shoulder: Secondary | ICD-10-CM | POA: Diagnosis not present

## 2016-09-28 ENCOUNTER — Telehealth: Payer: Self-pay | Admitting: Endocrinology

## 2016-09-28 DIAGNOSIS — M25511 Pain in right shoulder: Secondary | ICD-10-CM | POA: Diagnosis not present

## 2016-09-28 NOTE — Telephone Encounter (Signed)
Routing to you °

## 2016-09-28 NOTE — Telephone Encounter (Signed)
I presume that she is referring to fasting blood sugars She can increase her Lantus to 32 instead of 30

## 2016-09-28 NOTE — Telephone Encounter (Signed)
Patient called in reference to blood sugar. Patient stated she was told it should be between 90-130. Patient has not been able to get it below the140's  Please call patient and advise. OK to leave message if no answer.

## 2016-09-28 NOTE — Telephone Encounter (Signed)
I contacted the patient and advised of instructions via voicemail. Requested a call back if the patient would like to discuss further.

## 2016-09-29 DIAGNOSIS — Z1231 Encounter for screening mammogram for malignant neoplasm of breast: Secondary | ICD-10-CM | POA: Diagnosis not present

## 2016-09-30 DIAGNOSIS — M25511 Pain in right shoulder: Secondary | ICD-10-CM | POA: Diagnosis not present

## 2016-10-01 DIAGNOSIS — M25511 Pain in right shoulder: Secondary | ICD-10-CM | POA: Diagnosis not present

## 2016-10-07 ENCOUNTER — Other Ambulatory Visit: Payer: Self-pay

## 2016-10-07 DIAGNOSIS — E039 Hypothyroidism, unspecified: Secondary | ICD-10-CM | POA: Diagnosis not present

## 2016-10-07 DIAGNOSIS — E119 Type 2 diabetes mellitus without complications: Secondary | ICD-10-CM | POA: Diagnosis not present

## 2016-10-07 DIAGNOSIS — Z79899 Other long term (current) drug therapy: Secondary | ICD-10-CM | POA: Diagnosis not present

## 2016-10-07 DIAGNOSIS — R002 Palpitations: Secondary | ICD-10-CM | POA: Diagnosis not present

## 2016-10-07 HISTORY — PX: DENTAL SURGERY: SHX609

## 2016-10-07 MED ORDER — LIRAGLUTIDE 18 MG/3ML ~~LOC~~ SOPN: PEN_INJECTOR | SUBCUTANEOUS | 2 refills | Status: DC

## 2016-10-13 ENCOUNTER — Other Ambulatory Visit: Payer: Self-pay

## 2016-10-13 ENCOUNTER — Telehealth: Payer: Self-pay | Admitting: Endocrinology

## 2016-10-13 MED ORDER — LIRAGLUTIDE 18 MG/3ML ~~LOC~~ SOPN: PEN_INJECTOR | SUBCUTANEOUS | 2 refills | Status: DC

## 2016-10-13 NOTE — Telephone Encounter (Signed)
**  Remind patient they can make refill requests via MyChart**  Medication refill request (Name & Dosage):  Liraglutide (victoza) 18mg  /65ml SOPN  Preferred pharmacy (Name & Address):  Was not able to confirm pharmacy  Other comments (if applicable): Patient had to hold for lengthy time frame and disconnected twice b/c I had to check in patients. Please get correct pharmacy if she calls back.

## 2016-10-13 NOTE — Telephone Encounter (Signed)
Confirming pharmacy is [facility] on [address]

## 2016-10-13 NOTE — Telephone Encounter (Signed)
Please advise if okay to change the RX to the 1.8 dosage. Thanks!

## 2016-10-13 NOTE — Telephone Encounter (Signed)
Submitted

## 2016-10-13 NOTE — Telephone Encounter (Signed)
May change to 1.8 mg daily

## 2016-10-13 NOTE — Telephone Encounter (Signed)
Per patient, script needs to be changed to reflect 1.8 dosage. Currently reflects 1.2, and the patient ran out.

## 2016-10-14 DIAGNOSIS — M25511 Pain in right shoulder: Secondary | ICD-10-CM | POA: Diagnosis not present

## 2016-10-21 ENCOUNTER — Encounter: Payer: Self-pay | Admitting: Endocrinology

## 2016-10-21 ENCOUNTER — Ambulatory Visit (INDEPENDENT_AMBULATORY_CARE_PROVIDER_SITE_OTHER): Payer: Medicare Other | Admitting: Endocrinology

## 2016-10-21 VITALS — BP 124/80 | HR 89 | Ht 62.0 in | Wt 173.4 lb

## 2016-10-21 DIAGNOSIS — Z794 Long term (current) use of insulin: Secondary | ICD-10-CM

## 2016-10-21 DIAGNOSIS — E1165 Type 2 diabetes mellitus with hyperglycemia: Secondary | ICD-10-CM | POA: Diagnosis not present

## 2016-10-21 MED ORDER — SEMAGLUTIDE(0.25 OR 0.5MG/DOS) 2 MG/1.5ML ~~LOC~~ SOPN: 0.5000 mg | PEN_INJECTOR | SUBCUTANEOUS | 2 refills | Status: DC

## 2016-10-21 NOTE — Progress Notes (Signed)
Patient ID: [name] [name], female   DOB: 05/08/1945, 71 y.o.   MRN: [id number]            Reason for Appointment: Follow-up for Type 2 Diabetes   History of Present Illness:          Date of diagnosis of type 2 diabetes mellitus: ?  2014        Background history:   [name] [name] thinks [name] [name] blood sugar was 400 at the time of diagnosis but no detailed records of this are available [name] [name] did have an A1c of 10.6 done in 2014 and was probably given Lantus for some time initially Apparently [name] [name] was treated with various medications including metformin, Janumet and Tradgenta. [name] [name] blood sugars had improved and A1c in 2015 was down to 6.2 [name] [name] tends to have diarrhea with metformin and Janumet and also [name] [name] thinks it causes dry mouth Most likely Janumet was stopped in 06/2015 Glipizide was started in 8/17 when blood sugars were higher and A1c was 9%  Recent history:   INSULIN regimen is:   Lantus 34 units daily at 5 pm  Humalog 6 before breakfast--8  before dinner-4 hs  Non-insulin hypoglycemic drugs the patient is taking are: Victoza 1.8 mg daily, metformin ER 500 mg daily   [name] [name] A1c is consistently over 8%, now 8.3 in July  Current management, blood sugar patterns and problems identified:  [name] [name] blood sugars are mostly below 200 now and previously they have averaging 239  [name] [name] has started taking Victoza 1.8 mg daily  Again [name] [name] basal insulin was increased significantly also on the last visit and after [name] [name] visit on the phone also  [name] [name] was also advised to cut back on [name] [name] snacking including ice cream which [name] [name] is trying to do  However [name] [name] is still complaining of excessive hunger possibly from [name] [name] psychotropic medications  [name] [name] does think that [name] [name] is trying to walk a little  [name] [name] weight is the same although [name] [name] blood sugars are overall better  However [name] [name] has checked blood sugars mostly FASTING and not doing any reading later in the day as [name] [name] was before   Side effects from medications have been:?   Diarrhea from metformin and Janumet  Compliance with the medical regimen: Inconsistent  Glucose monitoring:  done 1 time a day or less        Glucometer: Contour        Blood Glucose readings by time of day and averages from monitor download  Mean values apply above for all meters except median for One Touch  PRE-MEAL Fasting Lunch Dinner Bedtime Overall  Glucose range: 129-309       Mean/median: 175        Self-care: The diet that the patient has been following EY:CXKG, tries to limit drinks with sugar .      Typical meal intake: Breakfast  May be eggs and sausage and lunch is usually a sandwich   Dinner 6 pm               has frequent snacks late at night Dietician visit, most recent: 2/18               Exercise:  a little more walking, irregular  Weight history:  Wt Readings from Last 3 Encounters:  10/21/16 173 lb 6.4 oz (78.7 kg)  09/11/16 173 lb 6.4 oz (78.7 kg)  07/30/16 172 lb 12.8 oz (78.4 kg)    Glycemic control:   Lab Results  Component Value Date  HGBA1C 8.3 (H) 09/07/2016   HGBA1C 8.2 (H) 05/28/2016   HGBA1C 9.0 (H) 10/31/2015   Lab Results  Component Value Date   MICROALBUR <0.7 07/10/2016   CREATININE 0.88 09/07/2016   Lab Results  Component Value Date   MICRALBCREAT 0.5 07/10/2016       Allergies as of 10/21/2016      Reactions   Codeine Anxiety, Other (See Comments)   Reaction:  Hallucinations   Macrolides And Ketolides Nausea And Vomiting   Procaine Hcl Palpitations   Aspirin Nausea And Vomiting   Reaction:  Burns pts stomach    Benadryl [diphenhydramine]    Per MD "inhibits potency of gabapentin, lithium etc"   Diflucan [fluconazole] Other (See Comments)   Reaction:  Unknown    Dilaudid [hydromorphone Hcl] Other (See Comments)   Reaction:  Migraines and nightmares    Morphine And Related Other (See Comments)   Reaction:  Migraines and nightmares    Other Nausea And Vomiting, Other (See Comments)   Pt states that all -mycins  cause N/V.    Sulfa Antibiotics Other (See Comments)   Reaction:  Unknown       Medication List       Accurate as of 10/21/16  9:07 PM. Always use your most recent med list.          acetaminophen 500 MG tablet Commonly known as:  TYLENOL Take 500-1,000 mg by mouth every 6 (six) hours as needed for mild pain, moderate pain, fever or headache.   atorvastatin 40 MG tablet Commonly known as:  LIPITOR Take 40 mg by mouth at bedtime.   cholecalciferol 1000 units tablet Commonly known as:  VITAMIN D Take 2,000 Units by mouth daily.   divalproex 250 MG 24 hr tablet Commonly known as:  DEPAKOTE ER Take 500 mg by mouth at bedtime.   donepezil 10 MG tablet Commonly known as:  ARICEPT donepezil 10 mg tablet daily   gabapentin 100 MG capsule Commonly known as:  NEURONTIN Take 300 mg by mouth 3 (three) times daily.   HUMALOG KWIKPEN 100 UNIT/ML KiwkPen Generic drug:  insulin lispro Inject 6-8 Units into the skin 3 (three) times daily. Pt uses 6 units before breakfast and lunch and 8 units before dinner. Takes an extra 4 units for a snack.   insulin glargine 100 unit/mL Sopn Commonly known as:  LANTUS Inject 34 Units into the skin at bedtime.   levothyroxine 75 MCG tablet Commonly known as:  SYNTHROID, LEVOTHROID Take 1 tablet (75 mcg total) by mouth daily before breakfast. I tablet daily before breakfast   liraglutide 18 MG/3ML Sopn Commonly known as:  VICTOZA inject 1.8 milligram subcutaneously daily   lithium 300 MG tablet Take 450 mg by mouth at bedtime.   loperamide 2 MG capsule Commonly known as:  IMODIUM Take 1 capsule (2 mg total) by mouth 4 (four) times daily as needed for diarrhea or loose stools.   LORazepam 2 MG tablet Commonly known as:  ATIVAN Take 1-2 mg by mouth 4 (four) times daily as needed for anxiety. Takes a 0.25 in the morning, then takes 0.5 twice daily   losartan 50 MG tablet Commonly known as:  COZAAR Take 50 mg by mouth daily.   memantine  10 MG tablet Commonly known as:  NAMENDA Take 10 mg by mouth at bedtime.   QUEtiapine 200 MG tablet Commonly known as:  SEROQUEL Take 100 mg by mouth at 5 pm and 200 mg by mouth at bedtime  rOPINIRole 0.5 MG tablet Commonly known as:  REQUIP Take 1 mg by mouth 2 (two) times daily. 1mg  in the morning and 1mg  at bedtime (tapering medication)   Semaglutide 0.25 or 0.5 MG/DOSE Sopn Commonly known as:  OZEMPIC Inject 0.5 mg into the skin once a week.       Allergies:  Allergies  Allergen Reactions  . Codeine Anxiety and Other (See Comments)    Reaction:  Hallucinations  . Macrolides And Ketolides Nausea And Vomiting  . Procaine Hcl Palpitations  . Aspirin Nausea And Vomiting    Reaction:  Burns pts stomach   . Benadryl [Diphenhydramine]     Per MD "inhibits potency of gabapentin, lithium etc"  . Diflucan [Fluconazole] Other (See Comments)    Reaction:  Unknown   . Dilaudid [Hydromorphone Hcl] Other (See Comments)    Reaction:  Migraines and nightmares   . Morphine And Related Other (See Comments)    Reaction:  Migraines and nightmares   . Other Nausea And Vomiting and Other (See Comments)    Pt states that all -mycins cause N/V.   Marland Kitchen Sulfa Antibiotics Other (See Comments)    Reaction:  Unknown     Past Medical History:  Diagnosis Date  . Alcohol abuse   . Anxiety    takes Valium daily as needed and Ativan daily  . Bilateral hearing loss   . Bipolar 1 disorder ([place])    takes Lithium nightly and Synthroid daily  . Chronic back pain    DDD  . Colitis, ischemic ([place]) 2012  . Confusion    r/t meds  . Depression    takes Prozac daily and Bupspirone   . Diabetes ([place])   . Diabetes mellitus without complication ([place])    takes Janumet daily  . Diverticulosis   . Diverticulosis   . Dyslipidemia    takes Crestor daily  . Fibromyalgia   . Hepatic steatosis 06/18/12   severe  . History of bronchitis    last time at least 37yrs ago  . Hyperlipidemia   . Hypothyroidism    . IBS (irritable bowel syndrome)   . Ischemic colitis ([place])   . Joint pain   . Joint swelling   . Lupus    tumid-skin  . Nephrolithiasis   . Numbness    in right foot  . Osteoarthritis    in hips  . Osteoarthritis cervical spine   . Osteoarthritis of hand    bilateral  . Restless leg syndrome    takes Requip nightly  . Sciatica   . Urinary frequency   . Urinary leakage   . Urinary urgency   . Urinary, incontinence, stress female   . Walking pneumonia    last time more than 65yrs ago    Past Surgical History:  Procedure Laterality Date  . APPENDECTOMY    . COLONOSCOPY    . D&C x 4    . DENTAL SURGERY Left 2 weeks ago    dental implant  . ESOPHAGOGASTRODUODENOSCOPY    . excision of tumors at right neck     angle of jaw '68, benign  . FLEXIBLE SIGMOIDOSCOPY N/A 06/21/2012   Procedure: FLEXIBLE SIGMOIDOSCOPY;  Surgeon: Jerene Bears, MD;  Location: [facility];  Service: Gastroenterology;  Laterality: N/A;  . PARTIAL HYSTERECTOMY    . TONSILLECTOMY    . TOTAL HIP ARTHROPLASTY Right 06/09/2013   Procedure: TOTAL HIP ARTHROPLASTY;  Surgeon: Kerin Salen, MD;  Location: [facility];  Service: Orthopedics;  Laterality: Right;  . TUBAL LIGATION      Family History  Problem Relation Age of Onset  . Drug abuse Mother   . Alcohol abuse Mother   . Alcohol abuse Father   . Alcohol abuse Brother        x 2    Social History:  reports that [name] [name] has never smoked. [name] [name] has never used smokeless tobacco. [name] [name] reports that [name] [name] does not drink alcohol or use drugs.   Review of Systems  Eyes: Positive for visual disturbance.      Lipid history: On Lipitor 40 mg Prescribed by [name] [name] PCP Last LDL still slightly over 100  Triglycerides Tend to be high but better than before, previously over 400   Lab Results  Component Value Date   CHOL 182 09/07/2016   HDL 45.70 09/07/2016   LDLDIRECT 103.0 09/07/2016   TRIG 292.0 (H) 09/07/2016   CHOLHDL 4 09/07/2016            Most recent eye  exam was In 6/18  Most recent foot exam:03/2016  THYROID: [name] [name] has been on levothyroxine and Cytomel from [name] [name] psychiatrist for several years with uncertain diagnosis Currently taking levothyroxine 75 g Has also been on Cytomel 25 g, last free T3 was normal  Also on lithium  Labs as follows:  Lab Results  Component Value Date   TSH 0.05 (L) 07/10/2016   TSH 0.26 (L) 04/30/2016   TSH 0.360 12/31/2015   FREET4 0.65 07/10/2016   FREET4 0.56 (L) 04/30/2016   Lab Results  Component Value Date   T3FREE 3.2 07/10/2016   T3FREE 2.7 04/30/2016    [name] [name] has atypical depression on multiple drugs   LABS:  No visits with results within 1 Week(s) from this visit.  Latest known visit with results is:  Lab on 09/07/2016  Component Date Value Ref Range Status  . Hgb A1c MFr Bld 09/07/2016 8.3* 4.6 - 6.5 % Final   Glycemic Control Guidelines for People with Diabetes:Non Diabetic:  <6%Goal of Therapy: <7%Additional Action Suggested:  >8%   . Sodium 09/07/2016 137  135 - 145 mEq/L Final  . Potassium 09/07/2016 5.0  3.5 - 5.1 mEq/L Final  . Chloride 09/07/2016 102  96 - 112 mEq/L Final  . CO2 09/07/2016 27  19 - 32 mEq/L Final  . Glucose, Bld 09/07/2016 255* 70 - 99 mg/dL Final  . BUN 09/07/2016 10  6 - 23 mg/dL Final  . Creatinine, Ser 09/07/2016 0.88  0.40 - 1.20 mg/dL Final  . Total Bilirubin 09/07/2016 0.5  0.2 - 1.2 mg/dL Final  . Alkaline Phosphatase 09/07/2016 98  39 - 117 U/L Final  . AST 09/07/2016 16  0 - 37 U/L Final  . ALT 09/07/2016 14  0 - 35 U/L Final  . Total Protein 09/07/2016 6.8  6.0 - 8.3 g/dL Final  . Albumin 09/07/2016 3.9  3.5 - 5.2 g/dL Final  . Calcium 09/07/2016 9.8  8.4 - 10.5 mg/dL Final  . GFR 09/07/2016 67.79  >60.00 mL/min Final  . Cholesterol 09/07/2016 182  0 - 200 mg/dL Final   ATP III Classification       Desirable:  < 200 mg/dL               Borderline High:  200 - 239 mg/dL          High:  > = 240 mg/dL  . Triglycerides 09/07/2016 292.0* 0.0 - 149.0  mg/dL Final   Normal:  <150  mg/dLBorderline High:  150 - 199 mg/dL  . HDL 09/07/2016 45.70  >39.00 mg/dL Final  . VLDL 09/07/2016 58.4* 0.0 - 40.0 mg/dL Final  . Total CHOL/HDL Ratio 09/07/2016 4   Final                  Men          Women1/2 Average Risk     3.4          3.3Average Risk          5.0          4.42X Average Risk          9.6          7.13X Average Risk          15.0          11.0                      . NonHDL 09/07/2016 136.05   Final   NOTE:  Non-HDL goal should be 30 mg/dL higher than patient's LDL goal (i.e. LDL goal of < 70 mg/dL, would have non-HDL goal of < 100 mg/dL)  . Direct LDL 09/07/2016 103.0  mg/dL Final   Optimal:  <100 mg/dLNear or Above Optimal:  100-129 mg/dLBorderline High:  130-159 mg/dLHigh:  160-189 mg/dLVery High:  >190 mg/dL    Physical Examination:  BP 124/80   Pulse 89   Ht 5\' 2"  (1.575 m)   Wt 173 lb 6.4 oz (78.7 kg)   SpO2 95%   BMI 31.72 kg/m          ASSESSMENT:  Diabetes type 2, uncontrolled     See history of present illness for detailed discussion of current diabetes management, blood sugar patterns and problems identified  [name] [name] is on Victoza, basal bolus insulin and Low-dose metformin currently Last A1c was still over 8  With [name] [name] trying to do a little better on [name] [name] diet and snacking as well as increasing [name] [name] insulin and Victoza [name] [name] blood sugars are better However to inconsistent and may be higher in the morning depending on [name] [name] snacking the night before [name] [name] has been able to maintain [name] [name] weight even with better blood sugars [name] [name] is trying to do a little walking [name] [name] [name] [name] is trying to help [name] [name] manage diabetes better but [name] [name] says [name] [name] is excessively hungry because of [name] [name] psychotropic medicine    PLAN:   Trial of Ozempic 0.5 mg weekly with the sample  May consider 1 mg if this is effective and [name] [name] has no nausea  Or readings after meals  Continue improving diet and exercise regimen  Continue low dose metformin  Thyroid  levels will be rechecked today  Patient Instructions  Check blood sugars on waking up  3-4/7  Also check blood sugars about 2 hours after a meal and do this after different meals by rotation  Recommended blood sugar levels on waking up is 90-130 and about 2 hours after meal is 130-160  Please bring your blood sugar monitor to each visit, thank you  Ozempic 0.5 weekly        Tarisha Fader 10/21/2016, 9:07 PM   Note: This office note was prepared with Estate agent. Any transcriptional errors that result from this process are unintentional.

## 2016-10-21 NOTE — Patient Instructions (Signed)
Check blood sugars on waking up  3-4/7  Also check blood sugars about 2 hours after a meal and do this after different meals by rotation  Recommended blood sugar levels on waking up is 90-130 and about 2 hours after meal is 130-160  Please bring your blood sugar monitor to each visit, thank you  Ozempic 0.5 weekly

## 2016-11-03 ENCOUNTER — Ambulatory Visit: Payer: Medicare Other | Admitting: Endocrinology

## 2016-11-17 DIAGNOSIS — E119 Type 2 diabetes mellitus without complications: Secondary | ICD-10-CM | POA: Diagnosis not present

## 2016-11-17 DIAGNOSIS — E785 Hyperlipidemia, unspecified: Secondary | ICD-10-CM | POA: Diagnosis not present

## 2016-11-17 DIAGNOSIS — I10 Essential (primary) hypertension: Secondary | ICD-10-CM | POA: Diagnosis not present

## 2016-11-18 ENCOUNTER — Encounter (HOSPITAL_COMMUNITY): Payer: Self-pay | Admitting: Emergency Medicine

## 2016-11-18 DIAGNOSIS — K760 Fatty (change of) liver, not elsewhere classified: Secondary | ICD-10-CM | POA: Diagnosis not present

## 2016-11-18 DIAGNOSIS — F319 Bipolar disorder, unspecified: Secondary | ICD-10-CM | POA: Insufficient documentation

## 2016-11-18 DIAGNOSIS — Z79899 Other long term (current) drug therapy: Secondary | ICD-10-CM | POA: Diagnosis not present

## 2016-11-18 DIAGNOSIS — M1990 Unspecified osteoarthritis, unspecified site: Secondary | ICD-10-CM | POA: Insufficient documentation

## 2016-11-18 DIAGNOSIS — F419 Anxiety disorder, unspecified: Secondary | ICD-10-CM | POA: Diagnosis not present

## 2016-11-18 DIAGNOSIS — M329 Systemic lupus erythematosus, unspecified: Secondary | ICD-10-CM | POA: Diagnosis not present

## 2016-11-18 DIAGNOSIS — M797 Fibromyalgia: Secondary | ICD-10-CM | POA: Diagnosis not present

## 2016-11-18 DIAGNOSIS — E785 Hyperlipidemia, unspecified: Secondary | ICD-10-CM | POA: Diagnosis not present

## 2016-11-18 DIAGNOSIS — I10 Essential (primary) hypertension: Secondary | ICD-10-CM | POA: Diagnosis not present

## 2016-11-18 DIAGNOSIS — E039 Hypothyroidism, unspecified: Secondary | ICD-10-CM | POA: Diagnosis not present

## 2016-11-18 DIAGNOSIS — G2581 Restless legs syndrome: Secondary | ICD-10-CM | POA: Diagnosis not present

## 2016-11-18 DIAGNOSIS — D649 Anemia, unspecified: Secondary | ICD-10-CM | POA: Diagnosis not present

## 2016-11-18 DIAGNOSIS — E119 Type 2 diabetes mellitus without complications: Secondary | ICD-10-CM | POA: Insufficient documentation

## 2016-11-18 DIAGNOSIS — K5732 Diverticulitis of large intestine without perforation or abscess without bleeding: Principal | ICD-10-CM | POA: Insufficient documentation

## 2016-11-18 DIAGNOSIS — Z794 Long term (current) use of insulin: Secondary | ICD-10-CM | POA: Insufficient documentation

## 2016-11-18 DIAGNOSIS — K5730 Diverticulosis of large intestine without perforation or abscess without bleeding: Secondary | ICD-10-CM | POA: Diagnosis not present

## 2016-11-18 DIAGNOSIS — G8929 Other chronic pain: Secondary | ICD-10-CM | POA: Insufficient documentation

## 2016-11-18 NOTE — ED Triage Notes (Signed)
Pt states she has a hx of colitis and thinks she is having a flare  Pt states she woke with abd pain last night that has gotten worse today   Pt states the pain is across her lower abdomen and feels like labor pains  Last BM was last night

## 2016-11-19 ENCOUNTER — Emergency Department (HOSPITAL_COMMUNITY): Payer: Medicare Other

## 2016-11-19 ENCOUNTER — Observation Stay (HOSPITAL_COMMUNITY)
Admission: EM | Admit: 2016-11-19 | Discharge: 2016-11-20 | Disposition: A | Discharge status: Home / Self Care | Payer: Medicare Other | Attending: Internal Medicine | Admitting: Internal Medicine

## 2016-11-19 ENCOUNTER — Encounter (HOSPITAL_COMMUNITY): Payer: Self-pay

## 2016-11-19 DIAGNOSIS — Z794 Long term (current) use of insulin: Secondary | ICD-10-CM | POA: Diagnosis not present

## 2016-11-19 DIAGNOSIS — F32A Depression, unspecified: Secondary | ICD-10-CM | POA: Diagnosis present

## 2016-11-19 DIAGNOSIS — E039 Hypothyroidism, unspecified: Secondary | ICD-10-CM | POA: Diagnosis not present

## 2016-11-19 DIAGNOSIS — D649 Anemia, unspecified: Secondary | ICD-10-CM

## 2016-11-19 DIAGNOSIS — I10 Essential (primary) hypertension: Secondary | ICD-10-CM | POA: Diagnosis present

## 2016-11-19 DIAGNOSIS — E118 Type 2 diabetes mellitus with unspecified complications: Secondary | ICD-10-CM | POA: Diagnosis not present

## 2016-11-19 DIAGNOSIS — F319 Bipolar disorder, unspecified: Secondary | ICD-10-CM | POA: Diagnosis not present

## 2016-11-19 DIAGNOSIS — K5732 Diverticulitis of large intestine without perforation or abscess without bleeding: Secondary | ICD-10-CM

## 2016-11-19 DIAGNOSIS — K5730 Diverticulosis of large intestine without perforation or abscess without bleeding: Secondary | ICD-10-CM | POA: Diagnosis not present

## 2016-11-19 DIAGNOSIS — F341 Dysthymic disorder: Secondary | ICD-10-CM | POA: Diagnosis present

## 2016-11-19 DIAGNOSIS — E1169 Type 2 diabetes mellitus with other specified complication: Secondary | ICD-10-CM | POA: Diagnosis present

## 2016-11-19 DIAGNOSIS — E785 Hyperlipidemia, unspecified: Secondary | ICD-10-CM | POA: Diagnosis present

## 2016-11-19 DIAGNOSIS — E1140 Type 2 diabetes mellitus with diabetic neuropathy, unspecified: Secondary | ICD-10-CM | POA: Diagnosis present

## 2016-11-19 DIAGNOSIS — E1159 Type 2 diabetes mellitus with other circulatory complications: Secondary | ICD-10-CM | POA: Diagnosis present

## 2016-11-19 DIAGNOSIS — K5792 Diverticulitis of intestine, part unspecified, without perforation or abscess without bleeding: Secondary | ICD-10-CM | POA: Diagnosis present

## 2016-11-19 DIAGNOSIS — H9190 Unspecified hearing loss, unspecified ear: Secondary | ICD-10-CM

## 2016-11-19 LAB — URINALYSIS, ROUTINE W REFLEX MICROSCOPIC
Bacteria, UA: NONE SEEN
Bilirubin Urine: NEGATIVE
Glucose, UA: NEGATIVE mg/dL
Hgb urine dipstick: NEGATIVE
Ketones, ur: 5 mg/dL — AB
Nitrite: NEGATIVE
Protein, ur: NEGATIVE mg/dL
Specific Gravity, Urine: 1.014 (ref 1.005–1.030)
pH: 5 (ref 5.0–8.0)

## 2016-11-19 LAB — COMPREHENSIVE METABOLIC PANEL
ALT: 15 U/L (ref 14–54)
AST: 16 U/L (ref 15–41)
Albumin: 3.7 g/dL (ref 3.5–5.0)
Alkaline Phosphatase: 88 U/L (ref 38–126)
Anion gap: 11 (ref 5–15)
BUN: 13 mg/dL (ref 6–20)
CO2: 23 mmol/L (ref 22–32)
Calcium: 10.1 mg/dL (ref 8.9–10.3)
Chloride: 105 mmol/L (ref 101–111)
Creatinine, Ser: 0.98 mg/dL (ref 0.44–1.00)
GFR calc Af Amer: >60 mL/min (ref >60)
GFR calc non Af Amer: 58 mL/min — ABNORMAL LOW (ref >60)
Glucose, Bld: 157 mg/dL — ABNORMAL HIGH (ref 65–99)
Potassium: 4.9 mmol/L (ref 3.5–5.1)
Sodium: 139 mmol/L (ref 135–145)
Total Bilirubin: 0.6 mg/dL (ref 0.3–1.2)
Total Protein: 7.4 g/dL (ref 6.5–8.1)

## 2016-11-19 LAB — DIFFERENTIAL
Basophils Absolute: 0 10*3/uL (ref 0.0–0.1)
Basophils Relative: 0 %
Eosinophils Absolute: 0.2 10*3/uL (ref 0.0–0.7)
Eosinophils Relative: 2 %
Lymphocytes Relative: 13 %
Lymphs Abs: 1.7 10*3/uL (ref 0.7–4.0)
Monocytes Absolute: 0.7 10*3/uL (ref 0.1–1.0)
Monocytes Relative: 6 %
Neutro Abs: 10.3 10*3/uL — ABNORMAL HIGH (ref 1.7–7.7)
Neutrophils Relative %: 79 %

## 2016-11-19 LAB — CBC
HCT: 36.8 % (ref 36.0–46.0)
Hemoglobin: 11.5 g/dL — ABNORMAL LOW (ref 12.0–15.0)
MCH: 29 pg (ref 26.0–34.0)
MCHC: 31.3 g/dL (ref 30.0–36.0)
MCV: 92.9 fL (ref 78.0–100.0)
Platelets: 334 10*3/uL (ref 150–400)
RBC: 3.96 MIL/uL (ref 3.87–5.11)
RDW: 14 % (ref 11.5–15.5)
WBC: 13.3 10*3/uL — ABNORMAL HIGH (ref 4.0–10.5)

## 2016-11-19 LAB — GLUCOSE, CAPILLARY
Glucose-Capillary: 119 mg/dL — ABNORMAL HIGH (ref 65–99)
Glucose-Capillary: 133 mg/dL — ABNORMAL HIGH (ref 65–99)
Glucose-Capillary: 113 mg/dL — ABNORMAL HIGH (ref 65–99)

## 2016-11-19 LAB — HEMOGLOBIN A1C
Hgb A1c MFr Bld: 6.5 % — ABNORMAL HIGH (ref 4.8–5.6)
Mean Plasma Glucose: 139.85 mg/dL

## 2016-11-19 LAB — POC OCCULT BLOOD, ED: Fecal Occult Bld: NEGATIVE

## 2016-11-19 LAB — LIPASE, BLOOD: Lipase: 22 U/L (ref 11–51)

## 2016-11-19 MED ORDER — DONEPEZIL HCL 10 MG PO TABS
10.0000 mg | ORAL_TABLET | Freq: Every day | ORAL | Status: DC
  Administered 2016-11-19: 10 mg via ORAL
  Filled 2016-11-19: qty 1

## 2016-11-19 MED ORDER — DIVALPROEX SODIUM ER 500 MG PO TB24
500.0000 mg | ORAL_TABLET | Freq: Every day | ORAL | Status: DC
  Administered 2016-11-19: 500 mg via ORAL
  Filled 2016-11-19: qty 1

## 2016-11-19 MED ORDER — ROPINIROLE HCL 1 MG PO TABS
0.5000 mg | ORAL_TABLET | Freq: Two times a day (BID) | ORAL | Status: DC
  Administered 2016-11-19: 0.5 mg via ORAL
  Filled 2016-11-19 (×3): qty 1

## 2016-11-19 MED ORDER — IOPAMIDOL (ISOVUE-300) INJECTION 61%
100.0000 mL | Freq: Once | INTRAVENOUS | Status: AC | PRN
  Administered 2016-11-19: 100 mL via INTRAVENOUS

## 2016-11-19 MED ORDER — INSULIN ASPART 100 UNIT/ML ~~LOC~~ SOLN: 0.0000 [IU] | Freq: Every day | SUBCUTANEOUS | Status: DC

## 2016-11-19 MED ORDER — MORPHINE SULFATE (PF) 4 MG/ML IV SOLN
4.0000 mg | INTRAVENOUS | Status: DC | PRN
  Administered 2016-11-19 (×2): 4 mg via INTRAVENOUS
  Filled 2016-11-19 (×4): qty 1

## 2016-11-19 MED ORDER — OXYCODONE HCL 5 MG PO TABS
10.0000 mg | ORAL_TABLET | Freq: Four times a day (QID) | ORAL | Status: DC | PRN
  Administered 2016-11-19: 10 mg via ORAL
  Filled 2016-11-19: qty 2

## 2016-11-19 MED ORDER — MORPHINE SULFATE (PF) 4 MG/ML IV SOLN
4.0000 mg | Freq: Once | INTRAVENOUS | Status: AC
  Administered 2016-11-19: 4 mg via INTRAVENOUS
  Filled 2016-11-19: qty 1

## 2016-11-19 MED ORDER — CIPROFLOXACIN HCL 500 MG PO TABS: 500.0000 mg | ORAL_TABLET | Freq: Once | ORAL | Status: DC

## 2016-11-19 MED ORDER — SODIUM CHLORIDE 0.9 % IV SOLN
INTRAVENOUS | Status: DC
  Administered 2016-11-19: 18:00:00 via INTRAVENOUS

## 2016-11-19 MED ORDER — METRONIDAZOLE IN NACL 5-0.79 MG/ML-% IV SOLN
500.0000 mg | Freq: Once | INTRAVENOUS | Status: AC
  Administered 2016-11-19: 500 mg via INTRAVENOUS
  Filled 2016-11-19: qty 100

## 2016-11-19 MED ORDER — ONDANSETRON HCL 4 MG/2ML IJ SOLN
4.0000 mg | Freq: Four times a day (QID) | INTRAMUSCULAR | Status: DC | PRN
  Administered 2016-11-20: 4 mg via INTRAVENOUS
  Filled 2016-11-19: qty 2

## 2016-11-19 MED ORDER — METRONIDAZOLE IN NACL 5-0.79 MG/ML-% IV SOLN
500.0000 mg | Freq: Three times a day (TID) | INTRAVENOUS | Status: DC
  Administered 2016-11-19 – 2016-11-20 (×4): 500 mg via INTRAVENOUS
  Filled 2016-11-19 (×4): qty 100

## 2016-11-19 MED ORDER — GABAPENTIN 300 MG PO CAPS
300.0000 mg | ORAL_CAPSULE | Freq: Three times a day (TID) | ORAL | Status: DC
  Administered 2016-11-19 – 2016-11-20 (×3): 300 mg via ORAL
  Filled 2016-11-19 (×4): qty 1

## 2016-11-19 MED ORDER — IOPAMIDOL (ISOVUE-300) INJECTION 61%
30.0000 mL | Freq: Once | INTRAVENOUS | Status: AC | PRN
  Administered 2016-11-19: 30 mL via ORAL

## 2016-11-19 MED ORDER — INSULIN ASPART 100 UNIT/ML ~~LOC~~ SOLN
0.0000 [IU] | Freq: Three times a day (TID) | SUBCUTANEOUS | Status: DC
  Administered 2016-11-19 – 2016-11-20 (×4): 1 [IU] via SUBCUTANEOUS

## 2016-11-19 MED ORDER — IOPAMIDOL (ISOVUE-300) INJECTION 61%
INTRAVENOUS | Status: AC
  Administered 2016-11-19: 100 mL via INTRAVENOUS
  Filled 2016-11-19: qty 100

## 2016-11-19 MED ORDER — ATORVASTATIN CALCIUM 40 MG PO TABS
40.0000 mg | ORAL_TABLET | Freq: Every day | ORAL | Status: DC
  Administered 2016-11-19: 40 mg via ORAL
  Filled 2016-11-19: qty 1

## 2016-11-19 MED ORDER — LEVOTHYROXINE SODIUM 75 MCG PO TABS
75.0000 ug | ORAL_TABLET | Freq: Every day | ORAL | Status: DC
  Administered 2016-11-20: 75 ug via ORAL
  Filled 2016-11-19: qty 1

## 2016-11-19 MED ORDER — LORAZEPAM 0.5 MG PO TABS: 0.5000 mg | ORAL_TABLET | Freq: Three times a day (TID) | ORAL | Status: DC | PRN

## 2016-11-19 MED ORDER — CIPROFLOXACIN IN D5W 400 MG/200ML IV SOLN
400.0000 mg | Freq: Once | INTRAVENOUS | Status: AC
  Administered 2016-11-19: 400 mg via INTRAVENOUS
  Filled 2016-11-19: qty 200

## 2016-11-19 MED ORDER — INSULIN GLARGINE 100 UNIT/ML ~~LOC~~ SOLN
10.0000 [IU] | Freq: Every day | SUBCUTANEOUS | Status: DC
  Administered 2016-11-19: 10 [IU] via SUBCUTANEOUS
  Filled 2016-11-19 (×2): qty 0.1

## 2016-11-19 MED ORDER — OXYCODONE HCL 5 MG PO TABS
10.0000 mg | ORAL_TABLET | ORAL | Status: DC | PRN
  Administered 2016-11-19 – 2016-11-20 (×2): 10 mg via ORAL
  Filled 2016-11-19 (×2): qty 2

## 2016-11-19 MED ORDER — LOSARTAN POTASSIUM 50 MG PO TABS
50.0000 mg | ORAL_TABLET | Freq: Every day | ORAL | Status: DC
  Administered 2016-11-19: 50 mg via ORAL
  Filled 2016-11-19 (×2): qty 1

## 2016-11-19 MED ORDER — LITHIUM CARBONATE 150 MG PO CAPS
450.0000 mg | ORAL_CAPSULE | Freq: Every day | ORAL | Status: DC
  Administered 2016-11-19: 450 mg via ORAL
  Filled 2016-11-19 (×2): qty 3

## 2016-11-19 MED ORDER — CIPROFLOXACIN IN D5W 400 MG/200ML IV SOLN
400.0000 mg | Freq: Two times a day (BID) | INTRAVENOUS | Status: DC
  Administered 2016-11-19 – 2016-11-20 (×2): 400 mg via INTRAVENOUS
  Filled 2016-11-19 (×2): qty 200

## 2016-11-19 MED ORDER — MEMANTINE HCL 10 MG PO TABS
10.0000 mg | ORAL_TABLET | Freq: Every day | ORAL | Status: DC
  Administered 2016-11-19: 10 mg via ORAL
  Filled 2016-11-19: qty 1

## 2016-11-19 MED ORDER — METRONIDAZOLE 500 MG PO TABS: 500.0000 mg | ORAL_TABLET | Freq: Once | ORAL | Status: DC

## 2016-11-19 MED ORDER — MORPHINE SULFATE (PF) 4 MG/ML IV SOLN
4.0000 mg | INTRAVENOUS | Status: DC | PRN
  Administered 2016-11-19 (×2): 4 mg via INTRAVENOUS
  Filled 2016-11-19 (×2): qty 1

## 2016-11-19 MED ORDER — IOPAMIDOL (ISOVUE-300) INJECTION 61%
INTRAVENOUS | Status: AC
  Administered 2016-11-19: 30 mL via ORAL
  Filled 2016-11-19: qty 30

## 2016-11-19 NOTE — H&P (Signed)
History and Physical  [name] [name] EUM:[id number] DOB: 10/17/1945 DOA: 11/19/2016  Referring physician: Delora Fuel, ER physician  PCP: [name] Pepper, MD  Outpatient Specialists: None Patient coming from: Home & is able to ambulate without assistance  Chief Complaint: Abdominal pain   HPI: [name] [name] is a 71 y.o. female with medical history significant for colitis, bipolar disorder and diabetes mellitus who was in her usual state of health and then starting Tuesday, 9/11 evening, started experiencing pain in her left lower quadrant. She thought initially that this was her colitis which she'd had several episodes in the past, but pain persisted and this felt different. She denied any nausea or vomiting or diarrhea. But with symptoms persistent, she came into the emergency room on the night of 9/12.   ED Course: She is found have a white count of 13 and an abdominal CT noted a sigmoid diverticulitis without evidence of abscess or microperforation. Patient was started on fluids and antibiotics and hospitalist will call for further evaluation and admission.  Review of Systems: Patient seen after arrival to floor . Pt complains of mild headache, which she says is secondary to the morphine. She complains of some discomfort in the left lower quadrant although better than when she first came in. She feels a little nauseated   Pt denies any vision changes, dysphagia, chest pain, palpitations, shortness of breath, wheeze, cough, abdominal pain, hematuria, dysuria, constipation, diarrhea, focal extremity numbness or weakness or pain .  Review of systems are otherwise negative   Past Medical History:  Diagnosis Date  . Alcohol abuse   . Anxiety    takes Valium daily as needed and Ativan daily  . Bilateral hearing loss   . Bipolar 1 disorder ([place])    takes Lithium nightly and Synthroid daily  . Chronic back pain    DDD  . Colitis, ischemic ([place]) 2012  . Confusion    r/t meds  .  Depression    takes Prozac daily and Bupspirone   . Diabetes ([place])   . Diabetes mellitus without complication ([place])    takes Janumet daily  . Diverticulosis   . Diverticulosis   . Dyslipidemia    takes Crestor daily  . Fibromyalgia   . Hepatic steatosis 06/18/12   severe  . History of bronchitis    last time at least 69yrs ago  . Hyperlipidemia   . Hypothyroidism   . IBS (irritable bowel syndrome)   . Ischemic colitis ([place])   . Joint pain   . Joint swelling   . Lupus    tumid-skin  . Nephrolithiasis   . Numbness    in right foot  . Osteoarthritis    in hips  . Osteoarthritis cervical spine   . Osteoarthritis of hand    bilateral  . Restless leg syndrome    takes Requip nightly  . Sciatica   . Urinary frequency   . Urinary leakage   . Urinary urgency   . Urinary, incontinence, stress female   . Walking pneumonia    last time more than 65yrs ago   Past Surgical History:  Procedure Laterality Date  . APPENDECTOMY    . COLONOSCOPY    . D&C x 4    . DENTAL SURGERY Left 2 weeks ago    dental implant  . ESOPHAGOGASTRODUODENOSCOPY    . excision of tumors at right neck     angle of jaw '68, benign  . FLEXIBLE SIGMOIDOSCOPY N/A 06/21/2012   Procedure: FLEXIBLE SIGMOIDOSCOPY;  Surgeon: Jerene Bears, MD;  Location: [facility];  Service: Gastroenterology;  Laterality: N/A;  . PARTIAL HYSTERECTOMY    . TONSILLECTOMY    . TOTAL HIP ARTHROPLASTY Right 06/09/2013   Procedure: TOTAL HIP ARTHROPLASTY;  Surgeon: Kerin Salen, MD;  Location: [facility];  Service: Orthopedics;  Laterality: Right;  . TUBAL LIGATION      Social History:  reports that she has never smoked. She has never used smokeless tobacco. She reports that she does not drink alcohol or use drugs. Lives at home with her husband. Ambulance without assistance   Allergies  Allergen Reactions  . Codeine Anxiety and Other (See Comments)    Reaction:  Hallucinations  . Macrolides And Ketolides Nausea And Vomiting  .  Procaine Hcl Palpitations  . Aspirin Nausea And Vomiting    Reaction:  Burns pts stomach   . Benadryl [Diphenhydramine]     Per MD "inhibits potency of gabapentin, lithium etc"  . Diflucan [Fluconazole] Other (See Comments)    Reaction:  Unknown   . Dilaudid [Hydromorphone Hcl] Other (See Comments)    Reaction:  Migraines and nightmares   . Morphine And Related Other (See Comments)    Reaction:  Migraines and nightmares   . Other Nausea And Vomiting and Other (See Comments)    Pt states that all -mycins cause N/V.   Marland Kitchen Sulfa Antibiotics Other (See Comments)    Reaction:  Unknown     Family History  Problem Relation Age of Onset  . Drug abuse Mother   . Alcohol abuse Mother   . Alcohol abuse Father   . Alcohol abuse Brother        x 2      Prior to Admission medications   Medication Sig Start Date End Date Taking? Authorizing Provider  acetaminophen (TYLENOL) 500 MG tablet Take 500-1,000 mg by mouth every 6 (six) hours as needed for mild pain, moderate pain, fever or headache.    Yes [provider]  atorvastatin (LIPITOR) 40 MG tablet Take 40 mg by mouth at bedtime.    Yes [provider]  cholecalciferol (VITAMIN D) 1000 units tablet Take 2,000 Units by mouth daily.    Yes [provider]  divalproex (DEPAKOTE ER) 250 MG 24 hr tablet Take 500 mg by mouth at bedtime.    Yes [provider]  donepezil (ARICEPT) 10 MG tablet donepezil 10 mg tablet daily   Yes [provider]  gabapentin (NEURONTIN) 100 MG capsule Take 300 mg by mouth 3 (three) times daily.    Yes [provider]  insulin glargine (LANTUS) 100 unit/mL SOPN Inject 34 Units into the skin at bedtime.    Yes [provider]  insulin lispro (HUMALOG KWIKPEN) 100 UNIT/ML KiwkPen Inject 6-8 Units into the skin 3 (three) times daily. Pt uses 6 units before breakfast and lunch and 8 units before dinner. Takes an extra 4 units for a snack.   Yes [provider]  levothyroxine (SYNTHROID, LEVOTHROID) 75 MCG tablet Take 1 tablet (75 mcg total) by mouth daily before breakfast. I tablet daily before breakfast 05/29/16  Yes Elayne Snare, MD  lithium 300 MG tablet Take 450 mg by mouth at bedtime.    Yes [provider]  LORazepam (ATIVAN) 2 MG tablet Take 0.5 mg by mouth every 8 (eight) hours as needed for anxiety. Takes a 0.25 in the morning, then takes 0.5 twice daily   Yes [provider]  losartan (COZAAR) 50  MG tablet Take 50 mg by mouth daily.   Yes [provider]  memantine (NAMENDA) 10 MG tablet Take 10 mg by mouth at bedtime.   Yes [provider]  metFORMIN (GLUCOPHAGE-XR) 500 MG 24 hr tablet Take 1 tablet by mouth daily.   Yes [provider]  QUEtiapine (SEROQUEL) 200 MG tablet Take 100 mg by mouth at 5 pm and 200 mg by mouth at bedtime   Yes [provider]  rOPINIRole (REQUIP) 0.5 MG tablet Take 0.5 mg by mouth 2 (two) times daily.    Yes [provider]  Semaglutide (OZEMPIC) 0.25 or 0.5 MG/DOSE SOPN Inject 0.5 mg into the skin once a week. 10/21/16  Yes Elayne Snare, MD    Physical Exam: BP 131/63 (BP Location: Right Arm)   Pulse 75   Temp 98.7 F (37.1 C)   Resp 18   Wt 78.9 kg (174 lb)   SpO2 98%   BMI 31.83 kg/m   General:  Alert and oriented 3, mild distress secondary to headache  Eyes: Sclera nonicteric, extraocular movements are intact  ENT: Normocephalic and atraumatic, mucous remains are slightly dry  Neck: Supple, no JVD  Cardiovascular: Regular rate and rhythm, S1-S2  Respiratory: Clear to auscultation bilaterally  Abdomen: Soft, mild tenderness left lower quadrant, nondistended, hypoactive bowel sounds  Skin: No skin breaks, tears or lesions  Musculoskeletal: No clubbing or cyanosis or edema  Psychiatric: Anxious, but no evidence of psychosis  Neurologic: No focal deficits           Labs on Admission:  Basic Metabolic Panel:  Recent  Labs Lab 11/19/16 0017  NA 139  K 4.9  CL 105  CO2 23  GLUCOSE 157*  BUN 13  CREATININE 0.98  CALCIUM 10.1   Liver Function Tests:  Recent Labs Lab 11/19/16 0017  AST 16  ALT 15  ALKPHOS 88  BILITOT 0.6  PROT 7.4  ALBUMIN 3.7    Recent Labs Lab 11/19/16 0017  LIPASE 22   No results for input(s): AMMONIA in the last 168 hours. CBC:  Recent Labs Lab 11/19/16 0017  WBC 13.3*  NEUTROABS 10.3*  HGB 11.5*  HCT 36.8  MCV 92.9  PLT 334   Cardiac Enzymes: No results for input(s): CKTOTAL, CKMB, CKMBINDEX, TROPONINI in the last 168 hours.  BNP (last 3 results) No results for input(s): BNP in the last 8760 hours.  ProBNP (last 3 results) No results for input(s): PROBNP in the last 8760 hours.  CBG:  Recent Labs Lab 11/19/16 1152  GLUCAP 119*    Radiological Exams on Admission: Ct Abdomen Pelvis W Contrast  Result Date: 11/19/2016 CLINICAL DATA:  Increasing lower abdominal pain. Diverticulitis suspected. EXAM: CT ABDOMEN AND PELVIS WITH CONTRAST TECHNIQUE: Multidetector CT imaging of the abdomen and pelvis was performed using the standard protocol following bolus administration of intravenous contrast. CONTRAST:  100 cc Isovue-300 IV COMPARISON:  CT 07/14/2016 FINDINGS: Lower chest: No acute abnormality. Hepatobiliary: Mild hepatic steatosis, unchanged from prior exam. Focal fatty infiltration adjacent with falciform ligament. No suspicious lesion. Gallbladder physiologically distended, no calcified stone. No biliary dilatation. Pancreas: No ductal dilatation or inflammation. Spleen: Normal in size without focal abnormality. Adrenals/Urinary Tract: Normal adrenal glands. Chronic dilatation of the right renal pelvis consistent with UPJ stenosis. Moderate renal atrophy is unchanged. No perinephric edema. Urinary bladder is minimally distended. No bladder wall thickening. Stomach/Bowel: Inflamed diverticulum in the mid sigmoid with surrounding pericolonic fat stranding  consistent with acute diverticulitis. No  abscess or perforation. Multiple additional noninflamed colonic diverticular of the sigmoid colon. No small bowel dilatation, wall thickening or inflammation. Appendix is not visualized. Vascular/Lymphatic: Normal caliber abdominal aorta. Portal and mesenteric veins are patent. No abdominal or pelvic adenopathy. Reproductive: Status post hysterectomy. No adnexal masses. Other: No free air, free fluid, or intra-abdominal fluid collection. Tiny fat containing umbilical hernia. Musculoskeletal: There are no acute or suspicious osseous abnormalities. Right hip arthroplasty. IMPRESSION: 1. Acute sigmoid diverticulitis without complication. 2. Chronic findings include hepatic steatosis and congenital right UPJ obstruction. Electronically Signed   By: Jeb Levering M.D.   On: 11/19/2016 04:33    EKG:   Not done  Assessment/Plan Present on Admission: . Diverticulitis . Hyperlipidemia with target low density lipoprotein (LDL) cholesterol less than 100 mg/dL . DEPRESSION/ANXIETY . Diabetes mellitus with diabetic neuropathy, without long-term current use of insulin ([place]) . Hypertension . Bipolar 1 disorder ([place]) . Hypothyroidism  Principal Problem:   Diverticulitis: Looks mild. White count only minimally elevated. Clear liquids, IV Cipro and Flagyl. Advance diet in the morning. Active Problems:   Hyperlipidemia with target low density lipoprotein (LDL) cholesterol less than 100 mg/dL: Continue statins    Hearing loss   Diabetes mellitus with diabetic neuropathy, without long-term current use of insulin ([place]): Given that she is on clear liquids, decrease home Lantus, hold home by mouth medications and cover with sliding scale   Hypertension   Bipolar 1 disorder ([place]) with history of depression and anxiety: Patient not be anxious, but tolerable. Continue home medications   Hypothyroidism: Continue home Synthroid   DVT prophylaxis: Lovenox  Code Status: full  code   Family Communication: Husband the bedside   Disposition Plan: potential discharge tomorrow if white count normalized and patient feeling better.   Consults called: none   Admission status: given potential for discharge tomorrow, will place under observation     Annita Brod MD [facility] Pager [phone]  If 7PM-7AM, please contact night-coverage [email or web address] Password [facility]  11/19/2016, 3:32 PM

## 2016-11-19 NOTE — ED Provider Notes (Signed)
[place] DEPT Provider Note   CSN: [id number] Arrival date & time: 11/18/16  1650     History   Chief Complaint Chief Complaint  Patient presents with  . Abdominal Pain    HPI [name] [name] is a 71 y.o. female.  The history is provided by the patient.  She has a history of colitis, diverticulosis, alcohol abuse, and irritable bowel syndrome. She comes in complaining of lower abdominal pain since this morning. Pain is crampy and severe and she rates it at 10/10. Nothing makes it worse. It is somewhat better following passage of flatus. She denies radiation of pain. There is no nausea or vomiting.she denies diarrhea, and back she states that she is constipated. She denies fever or chills. She has had similar episodes in the past from her colitis. She has been hospitalized several times for this, and states her gastroenterologist has told her he once to do a colonoscopy if it happens again. Of note, patient has been taking oxycodone 10 mg for an unrelated problem, and this is not helping her pain at all.  Past Medical History:  Diagnosis Date  . Alcohol abuse   . Anxiety    takes Valium daily as needed and Ativan daily  . Bilateral hearing loss   . Bipolar 1 disorder ([place])    takes Lithium nightly and Synthroid daily  . Chronic back pain    DDD  . Colitis, ischemic ([place]) 2012  . Confusion    r/t meds  . Depression    takes Prozac daily and Bupspirone   . Diabetes ([place])   . Diabetes mellitus without complication ([place])    takes Janumet daily  . Diverticulosis   . Diverticulosis   . Dyslipidemia    takes Crestor daily  . Fibromyalgia   . Hepatic steatosis 06/18/12   severe  . History of bronchitis    last time at least 67yrs ago  . Hyperlipidemia   . Hypothyroidism   . IBS (irritable bowel syndrome)   . Ischemic colitis ([place])   . Joint pain   . Joint swelling   . Lupus    tumid-skin  . Nephrolithiasis   . Numbness    in right foot  . Osteoarthritis    in  hips  . Osteoarthritis cervical spine   . Osteoarthritis of hand    bilateral  . Restless leg syndrome    takes Requip nightly  . Sciatica   . Urinary frequency   . Urinary leakage   . Urinary urgency   . Urinary, incontinence, stress female   . Walking pneumonia    last time more than 61yrs ago    Patient Active Problem List   Diagnosis Date Noted  . Hypothyroidism 07/14/2016  . Diarrhea   . Generalized abdominal pain   . Major depressive disorder, recurrent severe without psychotic features ([place]) 12/31/2015  . Nausea without vomiting   . Colitis 10/28/2015  . Unintentional poisoning by psychotropic drug 07/05/2015  . Restless leg syndrome 07/05/2015  . Diabetic polyneuropathy associated with type 2 diabetes mellitus ([place]) 09/21/2014  . Arthritis of left hip 06/09/2013  . Arthritis of right hip 06/08/2013  . Hip pain, right 05/02/2013  . IBS (irritable bowel syndrome) 01/02/2013  . Unspecified vitamin D deficiency 07/05/2012  . Pure hyperglyceridemia 07/04/2012  . Diabetes mellitus with diabetic neuropathy, without long-term current use of insulin ([place]) 06/18/2012  . Hypertension 06/18/2012  . Bipolar 1 disorder ([place]) 06/18/2012  . Low back pain 02/25/2011  .  Acute ischemic colitis ([place]) 01/06/2011  . Sciatica 12/19/2010  . Fibromyalgia 10/28/2010  . Primary osteoarthritis of right hip 10/24/2010  . Hyperlipidemia with target low density lipoprotein (LDL) cholesterol less than 100 mg/dL 05/05/2010  . DEPRESSION/ANXIETY 05/05/2010  . ABUSE, ALCOHOL, IN REMISSION 05/05/2010  . OTITIS MEDIA, CHRONIC 05/05/2010  . HEARING LOSS, BILATERAL 05/05/2010  . ALLERGIC RHINITIS, SEASONAL, MILD 05/05/2010  . URINARY INCONTINENCE, STRESS, FEMALE 05/05/2010  . OSTEOARTHRITIS, HANDS, BILATERAL 05/05/2010  . OSTEOARTHRITIS, CERVICAL SPINE 05/05/2010    Past Surgical History:  Procedure Laterality Date  . APPENDECTOMY    . COLONOSCOPY    . D&C x 4    . DENTAL SURGERY Left 2  weeks ago    dental implant  . ESOPHAGOGASTRODUODENOSCOPY    . excision of tumors at right neck     angle of jaw '68, benign  . FLEXIBLE SIGMOIDOSCOPY N/A 06/21/2012   Procedure: FLEXIBLE SIGMOIDOSCOPY;  Surgeon: Jerene Bears, MD;  Location: [facility];  Service: Gastroenterology;  Laterality: N/A;  . PARTIAL HYSTERECTOMY    . TONSILLECTOMY    . TOTAL HIP ARTHROPLASTY Right 06/09/2013   Procedure: TOTAL HIP ARTHROPLASTY;  Surgeon: Kerin Salen, MD;  Location: [facility];  Service: Orthopedics;  Laterality: Right;  . TUBAL LIGATION      OB History    No data available       Home Medications    Prior to Admission medications   Medication Sig Start Date End Date Taking? Authorizing Provider  acetaminophen (TYLENOL) 500 MG tablet Take 500-1,000 mg by mouth every 6 (six) hours as needed for mild pain, moderate pain, fever or headache.     [provider]  atorvastatin (LIPITOR) 40 MG tablet Take 40 mg by mouth at bedtime.     [provider]  cholecalciferol (VITAMIN D) 1000 units tablet Take 2,000 Units by mouth daily.     [provider]  divalproex (DEPAKOTE ER) 250 MG 24 hr tablet Take 500 mg by mouth at bedtime.     [provider]  donepezil (ARICEPT) 10 MG tablet donepezil 10 mg tablet daily    [provider]  gabapentin (NEURONTIN) 100 MG capsule Take 300 mg by mouth 3 (three) times daily.     [provider]  insulin glargine (LANTUS) 100 unit/mL SOPN Inject 34 Units into the skin at bedtime.     [provider]  insulin lispro (HUMALOG KWIKPEN) 100 UNIT/ML KiwkPen Inject 6-8 Units into the skin 3 (three) times daily. Pt uses 6 units before breakfast and lunch and 8 units before dinner. Takes an extra 4 units for a snack.    [provider]  levothyroxine (SYNTHROID, LEVOTHROID) 75 MCG tablet Take 1 tablet (75 mcg total) by mouth daily before breakfast. I tablet daily before breakfast 05/29/16   Elayne Snare, MD    liraglutide (VICTOZA) 18 MG/3ML SOPN inject 1.8 milligram subcutaneously daily 10/13/16   Elayne Snare, MD  lithium 300 MG tablet Take 450 mg by mouth at bedtime.     [provider]  loperamide (IMODIUM) 2 MG capsule Take 1 capsule (2 mg total) by mouth 4 (four) times daily as needed for diarrhea or loose stools. 07/16/16   Nita Sells, MD  LORazepam (ATIVAN) 2 MG tablet Take 1-2 mg by mouth 4 (four) times daily as needed for anxiety. Takes a 0.25 in the morning, then takes 0.5 twice daily    [provider]  losartan (COZAAR) 50 MG tablet Take  50 mg by mouth daily.    [provider]  memantine (NAMENDA) 10 MG tablet Take 10 mg by mouth at bedtime.    [provider]  QUEtiapine (SEROQUEL) 200 MG tablet Take 100 mg by mouth at 5 pm and 200 mg by mouth at bedtime    [provider]  rOPINIRole (REQUIP) 0.5 MG tablet Take 1 mg by mouth 2 (two) times daily. 1mg  in the morning and 1mg  at bedtime (tapering medication)    [provider]  Semaglutide (OZEMPIC) 0.25 or 0.5 MG/DOSE SOPN Inject 0.5 mg into the skin once a week. 10/21/16   Elayne Snare, MD    Family History Family History  Problem Relation Age of Onset  . Drug abuse Mother   . Alcohol abuse Mother   . Alcohol abuse Father   . Alcohol abuse Brother        x 2    Social History Social History  Substance Use Topics  . Smoking status: Never Smoker  . Smokeless tobacco: Never Used  . Alcohol use No     Allergies   Codeine; Macrolides and ketolides; Procaine hcl; Aspirin; Benadryl [diphenhydramine]; Diflucan [fluconazole]; Dilaudid [hydromorphone hcl]; Morphine and related; Other; and Sulfa antibiotics   Review of Systems Review of Systems  All other systems reviewed and are negative.    Physical Exam Updated Vital Signs BP (!) 150/80 (BP Location: Left Arm)   Pulse (!) 115   Temp 98.6 F (37 C) (Oral)   Resp 18   Wt 78.9 kg (174 lb)   SpO2 97%   BMI 31.83  kg/m   Physical Exam  Nursing note and vitals reviewed.  71 year old female, resting comfortably and in no acute distress. Vital signs are significant for tachycardia and hypertension. Oxygen saturation is 97%, which is normal. Head is normocephalic and atraumatic. PERRLA, EOMI. Oropharynx is clear. Neck is nontender and supple without adenopathy or JVD. Back is nontender and there is no CVA tenderness. Lungs are clear without rales, wheezes, or rhonchi. Chest is nontender. Heart has regular rate and rhythm without murmur. Abdomen is soft, flat, with moderate tenderness across the suprapubic area - worse on the right. There is no rebound tenderness and no guarding. There are no masses or hepatosplenomegaly and peristalsis is normoactive. Rectal: Normal sphincter tone, no impaction. Stool is light brown and sent for Hemoccult testing. Extremities have no cyanosis or edema, full range of motion is present. Skin is warm and dry without rash. Neurologic: Mental status is normal, cranial nerves are intact, there are no motor or sensory deficits.  ED Treatments / Results  Labs (all labs ordered are listed, but only abnormal results are displayed) Labs Reviewed  COMPREHENSIVE METABOLIC PANEL - Abnormal; Notable for the following:       Result Value   Glucose, Bld 157 (*)    GFR calc non Af Amer 58 (*)    All other components within normal limits  CBC - Abnormal; Notable for the following:    WBC 13.3 (*)    Hemoglobin 11.5 (*)    All other components within normal limits  URINALYSIS, ROUTINE W REFLEX MICROSCOPIC - Abnormal; Notable for the following:    Ketones, ur 5 (*)    Leukocytes, UA SMALL (*)    Squamous Epithelial / LPF 0-5 (*)    All other components within normal limits  DIFFERENTIAL - Abnormal; Notable for the following:    Neutro Abs 10.3 (*)    All  other components within normal limits  LIPASE, BLOOD  POC OCCULT BLOOD, ED    Radiology Ct Abdomen Pelvis W  Contrast  Result Date: 11/19/2016 CLINICAL DATA:  Increasing lower abdominal pain. Diverticulitis suspected. EXAM: CT ABDOMEN AND PELVIS WITH CONTRAST TECHNIQUE: Multidetector CT imaging of the abdomen and pelvis was performed using the standard protocol following bolus administration of intravenous contrast. CONTRAST:  100 cc Isovue-300 IV COMPARISON:  CT 07/14/2016 FINDINGS: Lower chest: No acute abnormality. Hepatobiliary: Mild hepatic steatosis, unchanged from prior exam. Focal fatty infiltration adjacent with falciform ligament. No suspicious lesion. Gallbladder physiologically distended, no calcified stone. No biliary dilatation. Pancreas: No ductal dilatation or inflammation. Spleen: Normal in size without focal abnormality. Adrenals/Urinary Tract: Normal adrenal glands. Chronic dilatation of the right renal pelvis consistent with UPJ stenosis. Moderate renal atrophy is unchanged. No perinephric edema. Urinary bladder is minimally distended. No bladder wall thickening. Stomach/Bowel: Inflamed diverticulum in the mid sigmoid with surrounding pericolonic fat stranding consistent with acute diverticulitis. No abscess or perforation. Multiple additional noninflamed colonic diverticular of the sigmoid colon. No small bowel dilatation, wall thickening or inflammation. Appendix is not visualized. Vascular/Lymphatic: Normal caliber abdominal aorta. Portal and mesenteric veins are patent. No abdominal or pelvic adenopathy. Reproductive: Status post hysterectomy. No adnexal masses. Other: No free air, free fluid, or intra-abdominal fluid collection. Tiny fat containing umbilical hernia. Musculoskeletal: There are no acute or suspicious osseous abnormalities. Right hip arthroplasty. IMPRESSION: 1. Acute sigmoid diverticulitis without complication. 2. Chronic findings include hepatic steatosis and congenital right UPJ obstruction. Electronically Signed   By: Jeb Levering M.D.   On: 11/19/2016 04:33     Procedures Procedures (including critical care time)  Medications Ordered in ED Medications  morphine 4 MG/ML injection 4 mg (4 mg Intravenous Given 11/19/16 0630)  metroNIDAZOLE (FLAGYL) IVPB 500 mg (500 mg Intravenous New Bag/Given 11/19/16 0731)  ondansetron (ZOFRAN) injection 4 mg (not administered)  morphine 4 MG/ML injection 4 mg (4 mg Intravenous Given 11/19/16 0733)  morphine 4 MG/ML injection 4 mg (4 mg Intravenous Given 11/19/16 0156)  iopamidol (ISOVUE-300) 61 % injection 30 mL (30 mLs Oral Contrast Given 11/19/16 0210)  morphine 4 MG/ML injection 4 mg (4 mg Intravenous Given 11/19/16 0309)  iopamidol (ISOVUE-300) 61 % injection 100 mL (100 mLs Intravenous Contrast Given 11/19/16 0341)  ciprofloxacin (CIPRO) IVPB 400 mg (400 mg Intravenous New Bag/Given 11/19/16 0630)     Initial Impression / Assessment and Plan / ED Course  I have reviewed the triage vital signs and the nursing notes.  Pertinent labs & imaging results that were available during my care of the patient were reviewed by me and considered in my medical decision making (see chart for details).  Abdominal pain in patient with history of IBS and also history of ischemic colitis. WBC is noted to be mildly elevated, but with no left shift. Hemoglobin is noted to a fall in by 2.7 g compared with last May. Stool is sent for Hemoccult testing and this is negative. Prior CT scans have shown diverticulosis, but CT angiogram of abdomen and pelvis one year ago showed no significant atherosclerosis. Prior hospitalization suspected irritable bowel syndrome as the cause of her pain rather than ischemia. She will need to be sent for repeat CT scan.  CT shows evidence of sigmoid diverticulitis. Patient was not vomiting, and I tried to work with her to get adequate pain control and take antibiotics orally at home. However, patient stated that she was not getting adequate pain control and  felt she needed to use stay. She is given IV  ciprofloxacin and metronidazole. Case is discussed with Dr. Loleta Books of [facility] who agrees to admit the patient.  Final Clinical Impressions(s) / ED Diagnoses   Final diagnoses:  Sigmoid diverticulitis  Normochromic normocytic anemia    New Prescriptions New Prescriptions   No medications on file     Delora Fuel, MD 20/60/15 0830

## 2016-11-20 DIAGNOSIS — K5792 Diverticulitis of intestine, part unspecified, without perforation or abscess without bleeding: Secondary | ICD-10-CM | POA: Diagnosis not present

## 2016-11-20 DIAGNOSIS — Z794 Long term (current) use of insulin: Secondary | ICD-10-CM | POA: Diagnosis not present

## 2016-11-20 DIAGNOSIS — K760 Fatty (change of) liver, not elsewhere classified: Secondary | ICD-10-CM | POA: Diagnosis not present

## 2016-11-20 DIAGNOSIS — I10 Essential (primary) hypertension: Secondary | ICD-10-CM | POA: Diagnosis not present

## 2016-11-20 DIAGNOSIS — E039 Hypothyroidism, unspecified: Secondary | ICD-10-CM | POA: Diagnosis not present

## 2016-11-20 DIAGNOSIS — E785 Hyperlipidemia, unspecified: Secondary | ICD-10-CM | POA: Diagnosis not present

## 2016-11-20 DIAGNOSIS — F419 Anxiety disorder, unspecified: Secondary | ICD-10-CM | POA: Diagnosis not present

## 2016-11-20 DIAGNOSIS — G8929 Other chronic pain: Secondary | ICD-10-CM | POA: Diagnosis not present

## 2016-11-20 DIAGNOSIS — Z79899 Other long term (current) drug therapy: Secondary | ICD-10-CM | POA: Diagnosis not present

## 2016-11-20 DIAGNOSIS — F319 Bipolar disorder, unspecified: Secondary | ICD-10-CM | POA: Diagnosis not present

## 2016-11-20 DIAGNOSIS — E119 Type 2 diabetes mellitus without complications: Secondary | ICD-10-CM | POA: Diagnosis not present

## 2016-11-20 DIAGNOSIS — M797 Fibromyalgia: Secondary | ICD-10-CM | POA: Diagnosis not present

## 2016-11-20 DIAGNOSIS — K5732 Diverticulitis of large intestine without perforation or abscess without bleeding: Secondary | ICD-10-CM | POA: Diagnosis not present

## 2016-11-20 LAB — CBC
HCT: 32.4 % — ABNORMAL LOW (ref 36.0–46.0)
Hemoglobin: 10.3 g/dL — ABNORMAL LOW (ref 12.0–15.0)
MCH: 29.2 pg (ref 26.0–34.0)
MCHC: 31.8 g/dL (ref 30.0–36.0)
MCV: 91.8 fL (ref 78.0–100.0)
Platelets: 293 10*3/uL (ref 150–400)
RBC: 3.53 MIL/uL — ABNORMAL LOW (ref 3.87–5.11)
RDW: 14 % (ref 11.5–15.5)
WBC: 11.1 10*3/uL — ABNORMAL HIGH (ref 4.0–10.5)

## 2016-11-20 LAB — BASIC METABOLIC PANEL
Anion gap: 9 (ref 5–15)
BUN: 9 mg/dL (ref 6–20)
CO2: 23 mmol/L (ref 22–32)
Calcium: 9.3 mg/dL (ref 8.9–10.3)
Chloride: 105 mmol/L (ref 101–111)
Creatinine, Ser: 0.83 mg/dL (ref 0.44–1.00)
GFR calc Af Amer: >60 mL/min (ref >60)
GFR calc non Af Amer: >60 mL/min (ref >60)
Glucose, Bld: 128 mg/dL — ABNORMAL HIGH (ref 65–99)
Potassium: 4.5 mmol/L (ref 3.5–5.1)
Sodium: 137 mmol/L (ref 135–145)

## 2016-11-20 LAB — GLUCOSE, CAPILLARY
Glucose-Capillary: 142 mg/dL — ABNORMAL HIGH (ref 65–99)
Glucose-Capillary: 142 mg/dL — ABNORMAL HIGH (ref 65–99)
Glucose-Capillary: 139 mg/dL — ABNORMAL HIGH (ref 65–99)

## 2016-11-20 MED ORDER — CIPROFLOXACIN IN D5W 400 MG/200ML IV SOLN: 400.0000 mg | Freq: Two times a day (BID) | INTRAVENOUS | Status: DC

## 2016-11-20 MED ORDER — METRONIDAZOLE 500 MG PO TABS: 500.0000 mg | ORAL_TABLET | Freq: Three times a day (TID) | ORAL | 0 refills | Status: AC

## 2016-11-20 MED ORDER — CIPROFLOXACIN HCL 500 MG PO TABS: 500.0000 mg | ORAL_TABLET | Freq: Two times a day (BID) | ORAL | 0 refills | Status: AC

## 2016-11-20 MED ORDER — ONDANSETRON HCL 4 MG/2ML IJ SOLN
4.0000 mg | Freq: Once | INTRAMUSCULAR | Status: AC
  Administered 2016-11-20: 4 mg via INTRAVENOUS
  Filled 2016-11-20: qty 2

## 2016-11-20 MED ORDER — POLYETHYLENE GLYCOL 3350 17 G PO PACK: 17.0000 g | PACK | Freq: Every day | ORAL | 0 refills | Status: DC | PRN

## 2016-11-20 MED ORDER — ONDANSETRON HCL 4 MG PO TABS: 4.0000 mg | ORAL_TABLET | Freq: Three times a day (TID) | ORAL | 0 refills | Status: DC | PRN

## 2016-11-20 MED ORDER — OXYCODONE HCL 5 MG PO TABS: 5.0000 mg | ORAL_TABLET | Freq: Four times a day (QID) | ORAL | 0 refills | Status: DC | PRN

## 2016-11-20 NOTE — Progress Notes (Signed)
Pt requesting script for nausea since she's on 2 antibiotics and had such bad nausea yesterday. Paged Dr. Posey Pronto.  Order placed for Zofran script by Dr. Posey Pronto.

## 2016-11-20 NOTE — Discharge Instructions (Signed)
Soft-Food Meal Plan A soft-food meal plan includes foods that are safe and easy to swallow. This meal plan typically is used:  As a transition meal plan after only having had liquid meals for a long period.  What do I need to know about the soft-food meal plan? A soft-food meal plan includes tender foods that are soft and easy to chew and swallow. In most cases, bite-sized pieces of food are easier to swallow. A bite-sized piece is about  inch or smaller. Foods in this plan do not need to be ground or pureed. Foods that are very hard, crunchy, or sticky should be avoided. Also, breads, cereals, yogurts, and desserts with nuts, seeds, or fruits should be avoided. What foods can I eat? Grains Rice and wild rice. Moist bread, dressing, pasta, and noodles. Well-moistened dry or cooked cereals, such as farina (cooked wheat cereal), oatmeal, or grits. Biscuits, breads, muffins, pancakes, and waffles that have been well moistened. Vegetables Shredded lettuce. Cooked, tender vegetables, including potatoes without skins. Vegetable juices. Broths or creamed soups made with vegetables that are not stringy or chewy. Strained tomatoes (without seeds). Fruits Canned or well-cooked fruits. Soft (ripe), peeled fresh fruits, such as peaches, nectarines, kiwi, cantaloupe, honeydew melon, and watermelon (without seeds). Soft berries with small seeds, such as strawberries. Fruit juices (without pulp). Meats and Other Protein Sources Moist, tender, lean beef. Mutton. Lamb. Veal. Chicken. [place]. Liver. Ham. Fish without bones. Eggs. Dairy Milk, milk drinks, and cream. Plain cream cheese and cottage cheese. Plain yogurt. Sweets/Desserts Flavored gelatin desserts. Custard. Plain ice cream, frozen yogurt, sherbet, milk shakes, and malts. Plain cakes and cookies. Plain hard candy. Other Butter, margarine (without trans fat), and cooking oils. Mayonnaise. Cream sauces. Mild spices, salt, and sugar. Syrup, molasses,  honey, and jelly. The items listed above may not be a complete list of recommended foods or beverages. Contact your dietitian for more options. What foods are not recommended? Grains Dry bread, toast, crackers that have not been moistened. Coarse or dry cereals, such as bran, granola, and shredded wheat. Tough or chewy crusty breads, such as [place] bread or baguettes. Vegetables Corn. Raw vegetables except shredded lettuce. Cooked vegetables that are tough or stringy. Tough, crisp, fried potatoes and potato skins. Fruits Fresh fruits with skins or seeds or both, such as apples, pears, or grapes. Stringy, high-pulp fruits, such as papaya, pineapple, coconut, or mango. Fruit leather, fruit roll-ups, and all dried fruits. Meats and Other Protein Sources Sausages and hot dogs. Meats with gristle. Fish with bones. Nuts, seeds, and chunky peanut or other nut butters. Sweets/Desserts Cakes or cookies that are very dry or chewy. The items listed above may not be a complete list of foods and beverages to avoid. Contact your dietitian for more information. This information is not intended to replace advice given to you by your health care provider. Make sure you discuss any questions you have with your health care provider. Document Released: 06/02/2007 Document Revised: 08/01/2015 Document Reviewed: 01/20/2013 Elsevier Interactive Patient Education  2017 [employer].

## 2016-11-20 NOTE — Progress Notes (Signed)
Reviewed AVS and discharge summary with pt and husband. They verbalized understanding and provided teachback. Pt discharged home in stable condition w/pain controlled via WC w/prescriptions and all belongings.

## 2016-11-23 NOTE — Discharge Summary (Signed)
[facility] Discharge Summary   Patient: [name] [name] BMW:[id number]   PCP: [place] Pepper, MD DOB: 12-01-1945   Date of admission: 11/19/2016   Date of discharge: 11/20/2016     Discharge Diagnoses:  Principal Problem:   Diverticulitis Active Problems:   Hyperlipidemia with target low density lipoprotein (LDL) cholesterol less than 100 mg/dL   DEPRESSION/ANXIETY   Hearing loss   Diabetes mellitus with diabetic neuropathy, without long-term current use of insulin ([place])   Hypertension   Bipolar 1 disorder ([place])   Hypothyroidism   Admitted From: home Disposition:  home  Recommendations for Outpatient Follow-up:  1. Please follow up with PCP in 1 week   Follow-up Information    London Pepper, MD. Schedule an appointment as soon as possible for a visit in 1 week(s).   Specialty:  Family Medicine Contact information: [place] [phone]        Doran Stabler, MD. Schedule an appointment as soon as possible for a visit in 2 month(s).   Specialty:  Gastroenterology Why:  post diverticulitis  Contact information: [address] [place] [address] [phone]          Diet recommendation: full to soft diet  Activity: The patient is advised to gradually reintroduce usual activities.  Discharge Condition: good  Code Status: full code  History of present illness: As per the H and P dictated on admission, "[name] [name] is a 71 y.o. female with medical history significant for colitis, bipolar disorder and diabetes mellitus who was in her usual state of health and then starting Tuesday, 9/11 evening, started experiencing pain in her left lower quadrant. She thought initially that this was her colitis which she'd had several episodes in the past, but pain persisted and this felt different. She denied any nausea or vomiting or diarrhea. But with symptoms persistent, she came into the emergency room on the  night of 9/12.   ED Course: She is found have a white count of 13 and an abdominal CT noted a sigmoid diverticulitis without evidence of abscess or microperforation. Patient was started on fluids and antibiotics and hospitalist will call for further evaluation and admission."  Hospital Course:  Summary of her active problems in the hospital is as following.   Diverticulitis: Looks mild.  White count only minimally elevated.  Tolerating liquids,  Was given IV Cipro and Flagyl, switch to PO   Hyperlipidemia with target low density lipoprotein (LDL) cholesterol less than 100 mg/dL: Continue statins    Hearing loss    Diabetes mellitus with diabetic neuropathy, without long-term current use of insulin ([place]): Given that she is on clear liquids, decrease home Lantus, hold home by mouth medications and cover with sliding scale    Hypertension    Bipolar 1 disorder ([place]) with history of depression and anxiety: Patient not be anxious, but tolerable. Continue home medications    Hypothyroidism: Continue home Synthroid  All other chronic medical condition were stable during the hospitalization.  Patient was ambulatory without any assistance. On the day of the discharge the patient's vitals were stable, and no other acute medical condition were reported by patient. the patient was felt safe to be discharge at home with family.  Procedures and Results:  noen   Consultations:  none  DISCHARGE MEDICATION: Discharge Medication List as of 11/20/2016  5:54 PM    START taking these medications   Details  ciprofloxacin (CIPRO) 500 MG tablet Take  1 tablet (500 mg total) by mouth 2 (two) times daily., Starting Fri 11/20/2016, Until Sat 11/28/2016, Normal    metroNIDAZOLE (FLAGYL) 500 MG tablet Take 1 tablet (500 mg total) by mouth 3 (three) times daily., Starting Fri 11/20/2016, Until Sat 11/28/2016, Normal    ondansetron (ZOFRAN) 4 MG tablet Take 1 tablet (4 mg total) by mouth every 8 (eight)  hours as needed for nausea or vomiting., Starting Fri 11/20/2016, Normal    oxyCODONE (OXY IR/ROXICODONE) 5 MG immediate release tablet Take 1 tablet (5 mg total) by mouth every 6 (six) hours as needed for severe pain., Starting Fri 11/20/2016, Print    polyethylene glycol (MIRALAX) packet Take 17 g by mouth daily as needed., Starting Fri 11/20/2016, Normal      CONTINUE these medications which have NOT CHANGED   Details  acetaminophen (TYLENOL) 500 MG tablet Take 500-1,000 mg by mouth every 6 (six) hours as needed for mild pain, moderate pain, fever or headache. , Historical Med    atorvastatin (LIPITOR) 40 MG tablet Take 40 mg by mouth at bedtime. , Historical Med    cholecalciferol (VITAMIN D) 1000 units tablet Take 2,000 Units by mouth daily. , Historical Med    divalproex (DEPAKOTE ER) 250 MG 24 hr tablet Take 500 mg by mouth at bedtime. , Historical Med    donepezil (ARICEPT) 10 MG tablet donepezil 10 mg tablet daily, Historical Med    gabapentin (NEURONTIN) 100 MG capsule Take 300 mg by mouth 3 (three) times daily. , Historical Med    insulin glargine (LANTUS) 100 unit/mL SOPN Inject 34 Units into the skin at bedtime. , Historical Med    insulin lispro (HUMALOG KWIKPEN) 100 UNIT/ML KiwkPen Inject 6-8 Units into the skin 3 (three) times daily. Pt uses 6 units before breakfast and lunch and 8 units before dinner. Takes an extra 4 units for a snack., Historical Med    levothyroxine (SYNTHROID, LEVOTHROID) 75 MCG tablet Take 1 tablet (75 mcg total) by mouth daily before breakfast. I tablet daily before breakfast, Starting Fri 05/29/2016, Normal    lithium 300 MG tablet Take 450 mg by mouth at bedtime. , Historical Med    LORazepam (ATIVAN) 2 MG tablet Take 0.5 mg by mouth every 8 (eight) hours as needed for anxiety. Takes a 0.25 in the morning, then takes 0.5 twice daily, Historical Med    losartan (COZAAR) 50 MG tablet Take 50 mg by mouth daily., Historical Med    memantine (NAMENDA)  10 MG tablet Take 10 mg by mouth at bedtime., Historical Med    metFORMIN (GLUCOPHAGE-XR) 500 MG 24 hr tablet Take 1 tablet by mouth daily., Historical Med    QUEtiapine (SEROQUEL) 200 MG tablet Take 100 mg by mouth at 5 pm and 200 mg by mouth at bedtime, Historical Med    rOPINIRole (REQUIP) 0.5 MG tablet Take 0.5 mg by mouth 2 (two) times daily. , Historical Med    Semaglutide (OZEMPIC) 0.25 or 0.5 MG/DOSE SOPN Inject 0.5 mg into the skin once a week., Starting Wed 10/21/2016, Normal       Allergies  Allergen Reactions  . Codeine Anxiety and Other (See Comments)    Reaction:  Hallucinations  . Macrolides And Ketolides Nausea And Vomiting  . Procaine Hcl Palpitations  . Aspirin Nausea And Vomiting    Reaction:  Burns pts stomach   . Benadryl [Diphenhydramine]     Per MD "inhibits potency of gabapentin, lithium etc"  . Diflucan [Fluconazole] Other (See Comments)  Reaction:  Unknown   . Dilaudid [Hydromorphone Hcl] Other (See Comments)    Reaction:  Migraines and nightmares   . Morphine And Related Other (See Comments)    Reaction:  Migraines and nightmares   . Other Nausea And Vomiting and Other (See Comments)    Pt states that all -mycins cause N/V.   Marland Kitchen Sulfa Antibiotics Other (See Comments)    Reaction:  Unknown    Discharge Instructions    Discharge instructions    Complete by:  As directed    It is important that you read following instructions as well as go over your medication list with RN to help you understand your care after this hospitalization.  Discharge Instructions: Please follow-up with PCP in one week  Please request your primary care physician to go over all Hospital Tests and Procedure/Radiological results at the follow up,  Please get all Hospital records sent to your PCP by signing hospital release before you go home.   Do not drive, operating heavy machinery, perform activities at heights, swimming or participation in water activities or provide baby  sitting services while you are on Pain, Sleep and Anxiety Medications; until you have been seen by Primary Care Physician or a Neurologist and advised to do so again. Do not take more than prescribed Pain, Sleep and Anxiety Medications. You were cared for by a hospitalist during your hospital stay. If you have any questions about your discharge medications or the care you received while you were in the hospital after you are discharged, you can call the unit and ask to speak with the hospitalist on call if the hospitalist that took care of you is not available.  Once you are discharged, your primary care physician will handle any further medical issues. Please note that NO REFILLS for any discharge medications will be authorized once you are discharged, as it is imperative that you return to your primary care physician (or establish a relationship with a primary care physician if you do not have one) for your aftercare needs so that they can reassess your need for medications and monitor your lab values. You Must read complete instructions/literature along with all the possible adverse reactions/side effects for all the Medicines you take and that have been prescribed to you. Take any new Medicines after you have completely understood and accept all the possible adverse reactions/side effects. Wear Seat belts while driving. If you have smoked or chewed Tobacco in the last 2 yrs please stop smoking and/or stop any Recreational drug use.   Increase activity slowly    Complete by:  As directed      Discharge Exam: Filed Weights   11/18/16 1722  Weight: 78.9 kg (174 lb)   Vitals:   11/20/16 0556 11/20/16 1400  BP: (!) 143/66 123/63  Pulse: 68 73  Resp: 18 17  Temp: 98.5 F (36.9 C) 98.8 F (37.1 C)  SpO2: 96% 97%   General: Appear in no distress, no Rash; Oral Mucosa moist. Cardiovascular: S1 and S2 Present, no Murmur, no JVD Respiratory: Bilateral Air entry present and Clear to  Auscultation, no Crackles, no wheezes Abdomen: Bowel Sound present, Soft and mild tenderness Extremities: no Pedal edema, no calf tenderness Neurology: Grossly no focal neuro deficit.  The results of significant diagnostics from this hospitalization (including imaging, microbiology, ancillary and laboratory) are listed below for reference.    Significant Diagnostic Studies: Ct Abdomen Pelvis W Contrast  Result Date: 11/19/2016 CLINICAL DATA:  Increasing lower abdominal  pain. Diverticulitis suspected. EXAM: CT ABDOMEN AND PELVIS WITH CONTRAST TECHNIQUE: Multidetector CT imaging of the abdomen and pelvis was performed using the standard protocol following bolus administration of intravenous contrast. CONTRAST:  100 cc Isovue-300 IV COMPARISON:  CT 07/14/2016 FINDINGS: Lower chest: No acute abnormality. Hepatobiliary: Mild hepatic steatosis, unchanged from prior exam. Focal fatty infiltration adjacent with falciform ligament. No suspicious lesion. Gallbladder physiologically distended, no calcified stone. No biliary dilatation. Pancreas: No ductal dilatation or inflammation. Spleen: Normal in size without focal abnormality. Adrenals/Urinary Tract: Normal adrenal glands. Chronic dilatation of the right renal pelvis consistent with UPJ stenosis. Moderate renal atrophy is unchanged. No perinephric edema. Urinary bladder is minimally distended. No bladder wall thickening. Stomach/Bowel: Inflamed diverticulum in the mid sigmoid with surrounding pericolonic fat stranding consistent with acute diverticulitis. No abscess or perforation. Multiple additional noninflamed colonic diverticular of the sigmoid colon. No small bowel dilatation, wall thickening or inflammation. Appendix is not visualized. Vascular/Lymphatic: Normal caliber abdominal aorta. Portal and mesenteric veins are patent. No abdominal or pelvic adenopathy. Reproductive: Status post hysterectomy. No adnexal masses. Other: No free air, free fluid, or  intra-abdominal fluid collection. Tiny fat containing umbilical hernia. Musculoskeletal: There are no acute or suspicious osseous abnormalities. Right hip arthroplasty. IMPRESSION: 1. Acute sigmoid diverticulitis without complication. 2. Chronic findings include hepatic steatosis and congenital right UPJ obstruction. Electronically Signed   By: Jeb Levering M.D.   On: 11/19/2016 04:33    Microbiology: No results found for this or any previous visit (from the past 240 hour(s)).   Labs: CBC:  Recent Labs Lab 11/19/16 0017 11/20/16 0419  WBC 13.3* 11.1*  NEUTROABS 10.3*  --   HGB 11.5* 10.3*  HCT 36.8 32.4*  MCV 92.9 91.8  PLT 334 161   Basic Metabolic Panel:  Recent Labs Lab 11/19/16 0017 11/20/16 0419  NA 139 137  K 4.9 4.5  CL 105 105  CO2 23 23  GLUCOSE 157* 128*  BUN 13 9  CREATININE 0.98 0.83  CALCIUM 10.1 9.3   Liver Function Tests:  Recent Labs Lab 11/19/16 0017  AST 16  ALT 15  ALKPHOS 88  BILITOT 0.6  PROT 7.4  ALBUMIN 3.7    Recent Labs Lab 11/19/16 0017  LIPASE 22   CBG:  Recent Labs Lab 11/19/16 1626 11/19/16 2210 11/20/16 0730 11/20/16 1154 11/20/16 1644  GLUCAP 133* 113* 142* 142* 139*   Time spent: 35 minutes  Signed:  Ivett Luebbe  [facility] 11/20/2016 , 2:14 PM

## 2016-11-24 ENCOUNTER — Other Ambulatory Visit: Payer: Self-pay

## 2016-11-24 ENCOUNTER — Telehealth: Payer: Self-pay | Admitting: Endocrinology

## 2016-11-24 DIAGNOSIS — K5792 Diverticulitis of intestine, part unspecified, without perforation or abscess without bleeding: Secondary | ICD-10-CM | POA: Diagnosis not present

## 2016-11-24 DIAGNOSIS — F319 Bipolar disorder, unspecified: Secondary | ICD-10-CM | POA: Diagnosis not present

## 2016-11-24 DIAGNOSIS — E785 Hyperlipidemia, unspecified: Secondary | ICD-10-CM | POA: Diagnosis not present

## 2016-11-24 DIAGNOSIS — E119 Type 2 diabetes mellitus without complications: Secondary | ICD-10-CM | POA: Diagnosis not present

## 2016-11-24 DIAGNOSIS — R110 Nausea: Secondary | ICD-10-CM | POA: Diagnosis not present

## 2016-11-24 DIAGNOSIS — Z09 Encounter for follow-up examination after completed treatment for conditions other than malignant neoplasm: Secondary | ICD-10-CM | POA: Diagnosis not present

## 2016-11-24 DIAGNOSIS — E039 Hypothyroidism, unspecified: Secondary | ICD-10-CM | POA: Diagnosis not present

## 2016-11-24 MED ORDER — GLUCOSE BLOOD VI STRP: ORAL_STRIP | 12 refills | Status: DC

## 2016-11-24 NOTE — Telephone Encounter (Signed)
MEDICATION: Contour next test strips   PHARMACY:   [id number] - [place], [place] - [address] AT [address] [phone] (Phone) [phone] (Fax)     IS THIS A 90 DAY SUPPLY : Y  IS PATIENT OUT OF MEDICATION: Y  IF NOT; HOW MUCH IS LEFT:   LAST APPOINTMENT DATE: 10/21/16  NEXT APPOINTMENT DATE: 12/21/16  OTHER COMMENTS:    **Let patient know to contact pharmacy at the end of the day to make sure medication is ready. **  ** Please notify patient to allow 48-72 hours to process**  **Encourage patient to contact the pharmacy for refills or they can request refills through [facility]**

## 2016-11-24 NOTE — Telephone Encounter (Signed)
Ordered

## 2016-11-25 DIAGNOSIS — M19011 Primary osteoarthritis, right shoulder: Secondary | ICD-10-CM | POA: Diagnosis not present

## 2016-12-01 ENCOUNTER — Telehealth: Payer: Self-pay | Admitting: Gastroenterology

## 2016-12-01 ENCOUNTER — Other Ambulatory Visit: Payer: Self-pay

## 2016-12-01 NOTE — Telephone Encounter (Signed)
Patient states that she is continuing to have RLQ pain and watery diarrhea. Finished her course of flagyl on 11/28/16 she reports symptoms were better but never stopped with the diarrhea. Patient denies fever. She states she is only have liquids. She does have hospital follow up with you on 12/17/16.

## 2016-12-02 ENCOUNTER — Other Ambulatory Visit: Payer: Self-pay

## 2016-12-02 ENCOUNTER — Other Ambulatory Visit: Payer: Medicare Other

## 2016-12-02 DIAGNOSIS — R197 Diarrhea, unspecified: Secondary | ICD-10-CM

## 2016-12-02 NOTE — Telephone Encounter (Signed)
Patient advised to come to our lab this morning to give stool sample testing for C diff. Advised not to take imodium until results are back. Order placed in [place].

## 2016-12-02 NOTE — Telephone Encounter (Signed)
I reviewed the discharge summary and looked at the CT scan.  I suspect the diverticulitis has set off her underlying IBS, leading to the persistent diarrhea.  It does not appear that C difficile was checked during hospital stay, probably because they had a diagnosis of diverticulitis as explanation of symptoms.  But since there is persistent diarrhea, it should be checked.  Please make arrangements for her to have C diff testing today.  If done today, we should have the results tomorrow.  If negative, imodium for diarrhea.  If that does not help enough, then I can prescribe Lomotil (if needed, that would require her to drop by office for a paper script before the weekend).

## 2016-12-03 ENCOUNTER — Other Ambulatory Visit: Payer: Self-pay | Admitting: Family Medicine

## 2016-12-03 ENCOUNTER — Telehealth: Payer: Self-pay | Admitting: Gastroenterology

## 2016-12-03 ENCOUNTER — Ambulatory Visit
Admission: RE | Admit: 2016-12-03 | Discharge: 2016-12-03 | Disposition: A | Discharge status: Home / Self Care | Payer: Medicare Other | Source: Ambulatory Visit | Attending: Family Medicine | Admitting: Family Medicine

## 2016-12-03 DIAGNOSIS — R0602 Shortness of breath: Secondary | ICD-10-CM

## 2016-12-03 DIAGNOSIS — F319 Bipolar disorder, unspecified: Secondary | ICD-10-CM | POA: Diagnosis not present

## 2016-12-03 DIAGNOSIS — R531 Weakness: Secondary | ICD-10-CM | POA: Diagnosis not present

## 2016-12-03 LAB — CLOSTRIDIUM DIFFICILE BY PCR: Toxigenic C. Difficile by PCR: NOT DETECTED

## 2016-12-03 NOTE — Telephone Encounter (Signed)
Spoke to patient let her know that we do not have the results yet, but as soon as we do, will contact her.

## 2016-12-04 ENCOUNTER — Telehealth: Payer: Self-pay | Admitting: Gastroenterology

## 2016-12-04 NOTE — Telephone Encounter (Signed)
Have you had a chance to review her results? 

## 2016-12-09 ENCOUNTER — Other Ambulatory Visit: Payer: Self-pay | Admitting: Family Medicine

## 2016-12-09 DIAGNOSIS — R0602 Shortness of breath: Secondary | ICD-10-CM

## 2016-12-11 ENCOUNTER — Other Ambulatory Visit: Payer: Self-pay

## 2016-12-11 ENCOUNTER — Ambulatory Visit (HOSPITAL_COMMUNITY): Payer: Medicare Other | Attending: Cardiovascular Disease

## 2016-12-11 DIAGNOSIS — E785 Hyperlipidemia, unspecified: Secondary | ICD-10-CM | POA: Diagnosis not present

## 2016-12-11 DIAGNOSIS — R0602 Shortness of breath: Secondary | ICD-10-CM | POA: Diagnosis not present

## 2016-12-11 DIAGNOSIS — I340 Nonrheumatic mitral (valve) insufficiency: Secondary | ICD-10-CM | POA: Insufficient documentation

## 2016-12-11 DIAGNOSIS — E119 Type 2 diabetes mellitus without complications: Secondary | ICD-10-CM | POA: Diagnosis not present

## 2016-12-11 DIAGNOSIS — I10 Essential (primary) hypertension: Secondary | ICD-10-CM | POA: Diagnosis not present

## 2016-12-12 DIAGNOSIS — M7541 Impingement syndrome of right shoulder: Secondary | ICD-10-CM | POA: Diagnosis not present

## 2016-12-12 DIAGNOSIS — E119 Type 2 diabetes mellitus without complications: Secondary | ICD-10-CM | POA: Diagnosis not present

## 2016-12-15 DIAGNOSIS — M7541 Impingement syndrome of right shoulder: Secondary | ICD-10-CM | POA: Diagnosis not present

## 2016-12-15 DIAGNOSIS — E119 Type 2 diabetes mellitus without complications: Secondary | ICD-10-CM | POA: Diagnosis not present

## 2016-12-15 DIAGNOSIS — Z79899 Other long term (current) drug therapy: Secondary | ICD-10-CM | POA: Diagnosis not present

## 2016-12-17 ENCOUNTER — Encounter: Payer: Self-pay | Admitting: Gastroenterology

## 2016-12-17 ENCOUNTER — Ambulatory Visit (INDEPENDENT_AMBULATORY_CARE_PROVIDER_SITE_OTHER): Payer: Medicare Other | Admitting: Gastroenterology

## 2016-12-17 ENCOUNTER — Other Ambulatory Visit (INDEPENDENT_AMBULATORY_CARE_PROVIDER_SITE_OTHER): Payer: Medicare Other

## 2016-12-17 VITALS — BP 108/70 | HR 68 | Ht 62.0 in | Wt 170.2 lb

## 2016-12-17 DIAGNOSIS — K580 Irritable bowel syndrome with diarrhea: Secondary | ICD-10-CM

## 2016-12-17 DIAGNOSIS — R194 Change in bowel habit: Secondary | ICD-10-CM

## 2016-12-17 DIAGNOSIS — Z794 Long term (current) use of insulin: Secondary | ICD-10-CM | POA: Diagnosis not present

## 2016-12-17 DIAGNOSIS — K5732 Diverticulitis of large intestine without perforation or abscess without bleeding: Secondary | ICD-10-CM

## 2016-12-17 DIAGNOSIS — R1032 Left lower quadrant pain: Secondary | ICD-10-CM

## 2016-12-17 DIAGNOSIS — E1165 Type 2 diabetes mellitus with hyperglycemia: Secondary | ICD-10-CM | POA: Diagnosis not present

## 2016-12-17 LAB — HEMOGLOBIN A1C: Hgb A1c MFr Bld: 6.3 % (ref 4.6–6.5)

## 2016-12-17 LAB — COMPREHENSIVE METABOLIC PANEL
ALT: 15 U/L (ref 0–35)
AST: 15 U/L (ref 0–37)
Albumin: 3.7 g/dL (ref 3.5–5.2)
Alkaline Phosphatase: 74 U/L (ref 39–117)
BUN: 6 mg/dL (ref 6–23)
CO2: 30 mEq/L (ref 19–32)
Calcium: 9.6 mg/dL (ref 8.4–10.5)
Chloride: 104 mEq/L (ref 96–112)
Creatinine, Ser: 0.83 mg/dL (ref 0.40–1.20)
GFR: 72.47 mL/min (ref >60.00)
Glucose, Bld: 160 mg/dL — ABNORMAL HIGH (ref 70–99)
Potassium: 4.4 mEq/L (ref 3.5–5.1)
Sodium: 139 mEq/L (ref 135–145)
Total Bilirubin: 0.4 mg/dL (ref 0.2–1.2)
Total Protein: 6.2 g/dL (ref 6.0–8.3)

## 2016-12-17 LAB — LIPID PANEL
Cholesterol: 146 mg/dL (ref 0–200)
HDL: 35.7 mg/dL — ABNORMAL LOW
NonHDL: 110.75
Total CHOL/HDL Ratio: 4
Triglycerides: 290 mg/dL — ABNORMAL HIGH (ref 0.0–149.0)
VLDL: 58 mg/dL — ABNORMAL HIGH (ref 0.0–40.0)

## 2016-12-17 LAB — LDL CHOLESTEROL, DIRECT: Direct LDL: 74 mg/dL

## 2016-12-17 MED ORDER — NA SULFATE-K SULFATE-MG SULF 17.5-3.13-1.6 GM/177ML PO SOLN: 1.0000 | Freq: Once | ORAL | 0 refills | Status: AC

## 2016-12-17 NOTE — Patient Instructions (Signed)
If you are age 69 or older, your body mass index should be between 23-30. Your Body mass index is 31.14 kg/m. If this is out of the aforementioned range listed, please consider follow up with your Primary Care Provider.  If you are age 67 or younger, your body mass index should be between 19-25. Your Body mass index is 31.14 kg/m. If this is out of the aformentioned range listed, please consider follow up with your Primary Care Provider.   You have been scheduled for a colonoscopy. Please follow written instructions given to you at your visit today.  Please pick up your prep supplies at the pharmacy within the next 1-3 days. If you use inhalers (even only as needed), please bring them with you on the day of your procedure. Your physician has requested that you go to www.startemmi.com and enter the access code given to you at your visit today. This web site gives a general overview about your procedure. However, you should still follow specific instructions given to you by our office regarding your preparation for the procedure.  Thank you for choosing [facility]  Dr Wilfrid Lund III

## 2016-12-17 NOTE — Progress Notes (Signed)
[name]  Chief Complaint: Abdominal pain and altered bowel habits, recent diverticulitis  Subjective  History:  This is a 71 year old woman known to me from multiple prior office visits. I most recently saw her in May of this year. [name] [name] has a long-standing history of diarrhea predominant IBS, and has had 2 episodes of apparent ischemic colitis, most recently in 2014. Her last colonoscopy late 2015 by Dr. Olevia Perches found severe left-sided diverticulosis, biopsies negative for microscopic colitis. [name] [name] was doing reasonably well until last month when she had the acute onset of severe left lower quadrant pain, and was admitted for sigmoid diverticulitis. He says the pain was excruciating, requiring morphine every 15 minutes. She was discharged to finish her course of antibiotics, and called [place] shortly afterwards with persistent diarrhea. Stool studies for C. difficile were negative, and I felt the diarrhea was likely her IBS triggered by the diverticulitis. She says that was getting much better and she had seen a nutritionist which helped regulate her diet in bowel habits. Over about the last week she has had recurrence of some bandlike lower abdominal pain with bloating and constipation. She might go 2 days without a BM, and there's been no rectal bleeding.  ROS: Cardiovascular:  no chest pain Respiratory: no dyspnea  The patient's Past Medical, Family and Social History were reviewed and are on file in the EMR.  Objective:  Med list reviewed  Current Outpatient Prescriptions:  .  acetaminophen (TYLENOL) 500 MG tablet, Take 500-1,000 mg by mouth every 6 (six) hours as needed for mild pain, moderate pain, fever or headache. , Disp: , Rfl:  .  atorvastatin (LIPITOR) 40 MG tablet, Take 40 mg by mouth at bedtime. , Disp: , Rfl: 0 .  cholecalciferol (VITAMIN D) 1000 units tablet, Take 2,000 Units by mouth daily. , Disp: , Rfl:  .  divalproex (DEPAKOTE ER) 250 MG 24  hr tablet, Take 500 mg by mouth at bedtime. , Disp: , Rfl:  .  donepezil (ARICEPT) 10 MG tablet, donepezil 10 mg tablet daily, Disp: , Rfl:  .  gabapentin (NEURONTIN) 100 MG capsule, Take 300 mg by mouth 3 (three) times daily. , Disp: , Rfl:  .  glucose blood (CONTOUR NEXT TEST) test strip, Use to test blood sugar after waking up 3 to 4 times a week, Disp: 100 each, Rfl: 12 .  insulin glargine (LANTUS) 100 unit/mL SOPN, Inject 34 Units into the skin at bedtime. , Disp: , Rfl:  .  insulin lispro (HUMALOG KWIKPEN) 100 UNIT/ML KiwkPen, Inject 6-8 Units into the skin 3 (three) times daily. Pt uses 6 units before breakfast and lunch and 8 units before dinner. Takes an extra 4 units for a snack., Disp: , Rfl:  .  levothyroxine (SYNTHROID, LEVOTHROID) 75 MCG tablet, Take 1 tablet (75 mcg total) by mouth daily before breakfast. I tablet daily before breakfast, Disp: 90 tablet, Rfl: 30 .  lithium 300 MG tablet, Take 450 mg by mouth at bedtime. , Disp: , Rfl:  .  LORazepam (ATIVAN) 2 MG tablet, Take 0.5 mg by mouth every 8 (eight) hours as needed for anxiety. Takes a 0.25 in the morning, then takes 0.5 twice daily, Disp: , Rfl: 0 .  losartan (COZAAR) 50 MG tablet, Take 50 mg by mouth daily., Disp: , Rfl: 0 .  memantine (NAMENDA) 10 MG tablet, Take 10 mg by mouth at bedtime., Disp: , Rfl:  .  metFORMIN (GLUCOPHAGE-XR) 500 MG 24 hr tablet,  Take 1 tablet by mouth daily., Disp: , Rfl:  .  ondansetron (ZOFRAN) 4 MG tablet, Take 1 tablet (4 mg total) by mouth every 8 (eight) hours as needed for nausea or vomiting., Disp: 20 tablet, Rfl: 0 .  oxyCODONE (OXY IR/ROXICODONE) 5 MG immediate release tablet, Take 1 tablet (5 mg total) by mouth every 6 (six) hours as needed for severe pain., Disp: 20 tablet, Rfl: 0 .  polyethylene glycol (MIRALAX) packet, Take 17 g by mouth daily as needed., Disp: 14 each, Rfl: 0 .  QUEtiapine (SEROQUEL) 200 MG tablet, Take 100 mg by mouth at 5 pm and 200 mg by mouth at bedtime, Disp: , Rfl:  0 .  rOPINIRole (REQUIP) 0.5 MG tablet, Take 0.5 mg by mouth 2 (two) times daily. , Disp: , Rfl:  .  Semaglutide (OZEMPIC) 0.25 or 0.5 MG/DOSE SOPN, Inject 0.5 mg into the skin once a week., Disp: 1 pen, Rfl: 2 .  Na Sulfate-K Sulfate-Mg Sulf 17.5-3.13-1.6 GM/180ML SOLN, Take 1 kit by mouth once., Disp: 354 mL, Rfl: 0   Vital signs in last 24 hrs: Vitals:   12/17/16 1547  BP: 108/70  Pulse: 68    Physical Exam  Anxious appearing with a somewhat restricted affect as before. Her husband is present for the entire encounter.  HEENT: sclera anicteric, oral mucosa moist without lesions  Neck: supple, no thyromegaly, JVD or lymphadenopathy  Cardiac: RRR without murmurs, S1S2 heard, no peripheral edema  Pulm: clear to auscultation bilaterally, normal RR and effort noted  Abdomen: soft, mild bilateral lower  tenderness, with active bowel sounds. No guarding or palpable hepatosplenomegaly.  Skin; warm and dry, no jaundice or rash  Recent Labs:  CBC Latest Ref Rng & Units 11/20/2016 11/19/2016 07/26/2016  WBC 4.0 - 10.5 K/uL 11.1(H) 13.3(H) 11.0(H)  Hemoglobin 12.0 - 15.0 g/dL 10.3(L) 11.5(L) 14.2  Hematocrit 36.0 - 46.0 % 32.4(L) 36.8 43.1  Platelets 150 - 400 K/uL 293 334 220     Radiologic studies:  CTAP personally reviewed - sigmoid diverticulitis.  Was ot present on CTAP May 2018  _0 @ Assessment: Encounter Diagnoses  Name Primary?  Marland Kitchen LLQ abdominal pain Yes  . Altered bowel habits   . Irritable bowel syndrome with diarrhea   . Sigmoid diverticulitis     I suspect she has a flare of her underlying IBS after this recent diverticulitis causing persistent abdominal pain and altered bowel habits. She is very concerned that there may be something else going on, especially since this was something she had not had before, and she was frightened about the previous episodes of ischemic colitis.  Plan: Colonoscopy. She would like to pursue that and is agreeable  and of course familiar with the procedure's risks. These were reviewed again.  The benefits and risks of the planned procedure were described in detail with the patient or (when appropriate) their health care proxy.  Risks were outlined as including, but not limited to, bleeding, infection, perforation, adverse medication reaction leading to cardiac or pulmonary decompensation, or pancreatitis (if ERCP).  The limitation of incomplete mucosal visualization was also discussed.  No guarantees or warranties were given.   Low-dose MiraLAX, a quarter to a half capful daily to relieve constipation. I suspect she may be sensitive to this with diarrhea if the doses too high.   Total time 30 minutes, over half spent in counseling and coordination of care.   [name]

## 2016-12-18 ENCOUNTER — Other Ambulatory Visit (HOSPITAL_COMMUNITY): Payer: Medicare Other

## 2016-12-18 ENCOUNTER — Telehealth: Payer: Self-pay | Admitting: Endocrinology

## 2016-12-18 NOTE — Telephone Encounter (Signed)
Patient need prescription for 34 unit per day [place] called in to [facility] in [place], [place] asap Previous RX (approx 20 units per day) says she has enough to last through November - therefore insurance company may not pay because they think patient has enough medication-in reality she is out because of the higher dosage prescribed Please call [phone] to let patient know she can pick up her medication

## 2016-12-21 ENCOUNTER — Encounter: Payer: Self-pay | Admitting: Endocrinology

## 2016-12-21 ENCOUNTER — Ambulatory Visit (INDEPENDENT_AMBULATORY_CARE_PROVIDER_SITE_OTHER): Payer: Medicare Other | Admitting: Endocrinology

## 2016-12-21 ENCOUNTER — Other Ambulatory Visit: Payer: Self-pay

## 2016-12-21 VITALS — BP 116/76 | HR 83 | Ht 62.0 in | Wt 169.4 lb

## 2016-12-21 DIAGNOSIS — E1165 Type 2 diabetes mellitus with hyperglycemia: Secondary | ICD-10-CM | POA: Diagnosis not present

## 2016-12-21 DIAGNOSIS — E039 Hypothyroidism, unspecified: Secondary | ICD-10-CM | POA: Diagnosis not present

## 2016-12-21 DIAGNOSIS — Z23 Encounter for immunization: Secondary | ICD-10-CM

## 2016-12-21 DIAGNOSIS — Z794 Long term (current) use of insulin: Secondary | ICD-10-CM

## 2016-12-21 MED ORDER — GLUCOSE BLOOD VI STRP: ORAL_STRIP | 3 refills | Status: DC

## 2016-12-21 MED ORDER — INSULIN GLARGINE 100 UNITS/ML SOLOSTAR PEN: 34.0000 [IU] | PEN_INJECTOR | Freq: Every day | SUBCUTANEOUS | 3 refills | Status: DC

## 2016-12-21 NOTE — Patient Instructions (Addendum)
Check blood sugars on waking up  3/7  Also check blood sugars about 2 hours after a meal and do this after different meals by rotation  Recommended blood sugar levels on waking up is 90-130 and about 2 hours after meal is 130-160  Please bring your blood sugar monitor to each visit, thank you  More walking

## 2016-12-21 NOTE — Progress Notes (Signed)
Patient ID: [name] [name], female   DOB: Sep 05, 1945, 71 y.o.   MRN: [id number]            Reason for Appointment: Follow-up for Type 2 Diabetes   History of Present Illness:          Date of diagnosis of type 2 diabetes mellitus: ?  2014        Background history:   She thinks her blood sugar was 400 at the time of diagnosis but no detailed records of this are available She did have an A1c of 10.6 done in 2014 and was probably given Lantus for some time initially Apparently she was treated with various medications including metformin, Janumet and Tradgenta. Her blood sugars had improved and A1c in 2015 was down to 6.2 She tends to have diarrhea with metformin and Janumet and also she thinks it causes dry mouth Most likely Janumet was stopped in 06/2015 Glipizide was started in 8/17 when blood sugars were higher and A1c was 9%  Recent history:   INSULIN regimen is:   Lantus 34 units daily at 5 pm  Humalog 6 before breakfast--8  before dinner-4 hs  Non-insulin hypoglycemic drugs the patient is taking are: Ozempic 0.5 mg weekly , metformin ER 500 mg daily   Her A1c previously was consistently over 8%, now 6.3   Current management, blood sugar patterns and problems identified:  Her blood sugars are appearing much improved since her last visit  This may be related to her switching from Victoza to his intake but also she has lost weight especially with her diverticulitis last month  She has also seen a dietitian elsewhere  She thinks her diet is better and she is eating smaller portions and less carbohydrate reportedly because of some allergies to foods like corns from some testing done by the dietitian  She has however check blood sugars mostly in the morning  Overnight blood sugars maybe occasionally higher based on what she is eating but fairly good overall and has only 2 readings over 180  She says she was trying to walk up to a mile a day before she got her diverticulitis  and is still not able to start back much because of weakness   Side effects from medications have been:?  Diarrhea from metformin and Janumet  Compliance with the medical regimen: Inconsistent  Glucose monitoring:  done 1 time a day or less        Glucometer: Contour        Blood Glucose readings by time of day and averages from monitor download  Mean values apply above for all meters except median for One Touch  PRE-MEAL Fasting Lunch Dinner Overnight  Overall  Glucose range: 111-154  115-144  128-148  99-177    Mean/median:    128  138    Self-care: The diet that the patient has been following NG:EXBM, tries to limit drinks with sugar .      Typical meal intake: Breakfast  May be eggs and sausage and lunch is usually a sandwich   Dinner 6 pm               Dietician visit, most recent: 10/18               Exercise:  a little walking   Weight history:  Wt Readings from Last 3 Encounters:  12/21/16 169 lb 6.4 oz (76.8 kg)  12/17/16 170 lb 4 oz (77.2 kg)  11/18/16 174 lb (78.9  kg)    Glycemic control:   Lab Results  Component Value Date   HGBA1C 6.3 12/17/2016   HGBA1C 6.5 (H) 11/19/2016   HGBA1C 8.3 (H) 09/07/2016   Lab Results  Component Value Date   MICROALBUR <0.7 07/10/2016   CREATININE 0.83 12/17/2016   Lab Results  Component Value Date   MICRALBCREAT 0.5 07/10/2016       Allergies as of 12/21/2016      Reactions   Codeine Anxiety, Other (See Comments)   Reaction:  Hallucinations   Macrolides And Ketolides Nausea And Vomiting   Procaine Hcl Palpitations   Aspirin Nausea And Vomiting   Reaction:  Burns pts stomach    Benadryl [diphenhydramine]    Per MD "inhibits potency of gabapentin, lithium etc"   Diflucan [fluconazole] Other (See Comments)   Reaction:  Unknown    Dilaudid [hydromorphone Hcl] Other (See Comments)   Reaction:  Migraines and nightmares    Morphine And Related Other (See Comments)   Reaction:  Migraines and nightmares    Other  Nausea And Vomiting, Other (See Comments)   Pt states that all -mycins cause N/V.    Sulfa Antibiotics Other (See Comments)   Reaction:  Unknown       Medication List       Accurate as of 12/21/16 12:50 PM. Always use your most recent med list.          acetaminophen 500 MG tablet Commonly known as:  TYLENOL Take 500-1,000 mg by mouth every 6 (six) hours as needed for mild pain, moderate pain, fever or headache.   atorvastatin 40 MG tablet Commonly known as:  LIPITOR Take 40 mg by mouth at bedtime.   cholecalciferol 1000 units tablet Commonly known as:  VITAMIN D Take 2,000 Units by mouth daily.   divalproex 250 MG 24 hr tablet Commonly known as:  DEPAKOTE ER Take 500 mg by mouth at bedtime.   donepezil 10 MG tablet Commonly known as:  ARICEPT donepezil 10 mg tablet daily   gabapentin 100 MG capsule Commonly known as:  NEURONTIN Take 300 mg by mouth 3 (three) times daily.   glucose blood test strip Commonly known as:  CONTOUR NEXT TEST Use to test blood sugar after waking up 3 to 4 times a week   HUMALOG KWIKPEN 100 UNIT/ML KiwkPen Generic drug:  insulin lispro Inject 6-8 Units into the skin 3 (three) times daily. Pt uses 6 units before breakfast and lunch and 8 units before dinner. Takes an extra 4 units for a snack.   insulin glargine 100 unit/mL Sopn Commonly known as:  LANTUS Inject 0.34 mLs (34 Units total) into the skin at bedtime.   levothyroxine 75 MCG tablet Commonly known as:  SYNTHROID, LEVOTHROID Take 1 tablet (75 mcg total) by mouth daily before breakfast. I tablet daily before breakfast   lithium 300 MG tablet Take 450 mg by mouth at bedtime.   LORazepam 2 MG tablet Commonly known as:  ATIVAN Take 0.5 mg by mouth every 8 (eight) hours as needed for anxiety. Takes a 0.25 in the morning, then takes 0.5 twice daily   losartan 50 MG tablet Commonly known as:  COZAAR Take 50 mg by mouth daily.   memantine 10 MG tablet Commonly known as:   NAMENDA Take 10 mg by mouth at bedtime.   metFORMIN 500 MG 24 hr tablet Commonly known as:  GLUCOPHAGE-XR Take 1 tablet by mouth daily.   ondansetron 4 MG tablet Commonly known as:  ZOFRAN Take 1 tablet (4 mg total) by mouth every 8 (eight) hours as needed for nausea or vomiting.   oxyCODONE 5 MG immediate release tablet Commonly known as:  Oxy IR/ROXICODONE Take 1 tablet (5 mg total) by mouth every 6 (six) hours as needed for severe pain.   polyethylene glycol packet Commonly known as:  MIRALAX Take 17 g by mouth daily as needed.   QUEtiapine 200 MG tablet Commonly known as:  SEROQUEL Take 100 mg by mouth at 5 pm and 200 mg by mouth at bedtime   rOPINIRole 0.5 MG tablet Commonly known as:  REQUIP Take 0.5 mg by mouth 2 (two) times daily.   Semaglutide 0.25 or 0.5 MG/DOSE Sopn Commonly known as:  OZEMPIC Inject 0.5 mg into the skin once a week.       Allergies:  Allergies  Allergen Reactions  . Codeine Anxiety and Other (See Comments)    Reaction:  Hallucinations  . Macrolides And Ketolides Nausea And Vomiting  . Procaine Hcl Palpitations  . Aspirin Nausea And Vomiting    Reaction:  Burns pts stomach   . Benadryl [Diphenhydramine]     Per MD "inhibits potency of gabapentin, lithium etc"  . Diflucan [Fluconazole] Other (See Comments)    Reaction:  Unknown   . Dilaudid [Hydromorphone Hcl] Other (See Comments)    Reaction:  Migraines and nightmares   . Morphine And Related Other (See Comments)    Reaction:  Migraines and nightmares   . Other Nausea And Vomiting and Other (See Comments)    Pt states that all -mycins cause N/V.   Marland Kitchen Sulfa Antibiotics Other (See Comments)    Reaction:  Unknown     Past Medical History:  Diagnosis Date  . Alcohol abuse   . Anxiety    takes Valium daily as needed and Ativan daily  . Bilateral hearing loss   . Bipolar 1 disorder ([place])    takes Lithium nightly and Synthroid daily  . Chronic back pain    DDD  . Colitis, ischemic  ([place]) 2012  . Confusion    r/t meds  . Depression    takes Prozac daily and Bupspirone   . Diabetes ([place])   . Diabetes mellitus without complication ([place])    takes Janumet daily  . Diverticulosis   . Diverticulosis   . Dyslipidemia    takes Crestor daily  . Fibromyalgia   . Hepatic steatosis 06/18/12   severe  . History of bronchitis    last time at least 38yrs ago  . Hyperlipidemia   . Hypothyroidism   . IBS (irritable bowel syndrome)   . Ischemic colitis ([place])   . Joint pain   . Joint swelling   . Lupus    tumid-skin  . Nephrolithiasis   . Numbness    in right foot  . Osteoarthritis    in hips  . Osteoarthritis cervical spine   . Osteoarthritis of hand    bilateral  . Restless leg syndrome    takes Requip nightly  . Sciatica   . Urinary frequency   . Urinary leakage   . Urinary urgency   . Urinary, incontinence, stress female   . Walking pneumonia    last time more than 47yrs ago    Past Surgical History:  Procedure Laterality Date  . APPENDECTOMY    . COLONOSCOPY    . D&C x 4    . DENTAL SURGERY Left 2 weeks ago    dental implant  .  ESOPHAGOGASTRODUODENOSCOPY    . excision of tumors at right neck     angle of jaw '68, benign  . FLEXIBLE SIGMOIDOSCOPY N/A 06/21/2012   Procedure: FLEXIBLE SIGMOIDOSCOPY;  Surgeon: Jerene Bears, MD;  Location: [facility];  Service: Gastroenterology;  Laterality: N/A;  . PARTIAL HYSTERECTOMY    . TONSILLECTOMY    . TOTAL HIP ARTHROPLASTY Right 06/09/2013   Procedure: TOTAL HIP ARTHROPLASTY;  Surgeon: Kerin Salen, MD;  Location: [facility];  Service: Orthopedics;  Laterality: Right;  . TUBAL LIGATION      Family History  Problem Relation Age of Onset  . Drug abuse Mother   . Alcohol abuse Mother   . Alcohol abuse Father   . Alcohol abuse Brother        x 2    Social History:  reports that she has never smoked. She has never used smokeless tobacco. She reports that she does not drink alcohol or use drugs.   Review of  Systems    Lipid history: On Lipitor 40 mg Prescribed by her PCP Last LDL Improved  Triglycerides Again high but better than before, previously over 400   Lab Results  Component Value Date   CHOL 146 12/17/2016   HDL 35.70 (L) 12/17/2016   LDLDIRECT 74.0 12/17/2016   TRIG 290.0 (H) 12/17/2016   CHOLHDL 4 12/17/2016            Most recent eye exam was In 6/18  Most recent foot exam:03/2016  THYROID: She has been on levothyroxine and Cytomel from her psychiatrist for several years with uncertain diagnosis Currently taking levothyroxine 75 g Has also been on Cytomel 25 g, last free T3 was normal  Also on lithium  Labs as follows:  Lab Results  Component Value Date   TSH 0.05 (L) 07/10/2016   TSH 0.26 (L) 04/30/2016   TSH 0.360 12/31/2015   FREET4 0.65 07/10/2016   FREET4 0.56 (L) 04/30/2016   Lab Results  Component Value Date   T3FREE 3.2 07/10/2016   T3FREE 2.7 04/30/2016    She has atypical depression on multiple drugs   LABS:  Lab on 12/17/2016  Component Date Value Ref Range Status  . Hgb A1c MFr Bld 12/17/2016 6.3  4.6 - 6.5 % Final   Glycemic Control Guidelines for People with Diabetes:Non Diabetic:  <6%Goal of Therapy: <7%Additional Action Suggested:  >8%   . Sodium 12/17/2016 139  135 - 145 mEq/L Final  . Potassium 12/17/2016 4.4  3.5 - 5.1 mEq/L Final  . Chloride 12/17/2016 104  96 - 112 mEq/L Final  . CO2 12/17/2016 30  19 - 32 mEq/L Final  . Glucose, Bld 12/17/2016 160* 70 - 99 mg/dL Final  . BUN 12/17/2016 6  6 - 23 mg/dL Final  . Creatinine, Ser 12/17/2016 0.83  0.40 - 1.20 mg/dL Final  . Total Bilirubin 12/17/2016 0.4  0.2 - 1.2 mg/dL Final  . Alkaline Phosphatase 12/17/2016 74  39 - 117 U/L Final  . AST 12/17/2016 15  0 - 37 U/L Final  . ALT 12/17/2016 15  0 - 35 U/L Final  . Total Protein 12/17/2016 6.2  6.0 - 8.3 g/dL Final  . Albumin 12/17/2016 3.7  3.5 - 5.2 g/dL Final  . Calcium 12/17/2016 9.6  8.4 - 10.5 mg/dL Final  . GFR  12/17/2016 72.47  >60.00 mL/min Final  . Cholesterol 12/17/2016 146  0 - 200 mg/dL Final   ATP III Classification  Desirable:  < 200 mg/dL               Borderline High:  200 - 239 mg/dL          High:  > = 240 mg/dL  . Triglycerides 12/17/2016 290.0* 0.0 - 149.0 mg/dL Final   Normal:  <150 mg/dLBorderline High:  150 - 199 mg/dL  . HDL 12/17/2016 35.70* >39.00 mg/dL Final  . VLDL 12/17/2016 58.0* 0.0 - 40.0 mg/dL Final  . Total CHOL/HDL Ratio 12/17/2016 4   Final                  Men          Women1/2 Average Risk     3.4          3.3Average Risk          5.0          4.42X Average Risk          9.6          7.13X Average Risk          15.0          11.0                      . NonHDL 12/17/2016 110.75   Final   NOTE:  Non-HDL goal should be 30 mg/dL higher than patient's LDL goal (i.e. LDL goal of < 70 mg/dL, would have non-HDL goal of < 100 mg/dL)  . Direct LDL 12/17/2016 74.0  mg/dL Final   Optimal:  <100 mg/dLNear or Above Optimal:  100-129 mg/dLBorderline High:  130-159 mg/dLHigh:  160-189 mg/dLVery High:  >190 mg/dL    Physical Examination:  BP 116/76   Pulse 83   Ht 5\' 2"  (1.575 m)   Wt 169 lb 6.4 oz (76.8 kg)   SpO2 94%   BMI 30.98 kg/m          ASSESSMENT:  Diabetes type 2, uncontrolled     See history of present illness for detailed discussion of current diabetes management, blood sugar patterns and problems identified  She is on Ozempic, basal bolus insulin and Low-dose metformin currently Last A1c Is much better at 6.3 compared to 8% before With her weight loss and improved diet she is doing better and postprandial readings are improved However still needing about the same amount of basal insulin   LIPIDS: Somewhat better although triglycerides are high and her lab was done nonfasting  PLAN:   Continue Ozempic 0.5 mg weekly   More readings after meals  Continue to follow her diet  Increase walking for exercise  Discussed blood sugar targets at  various times  Continue low dose metformin  Thyroid levels will be rechecked on the next visit for her hypothyroidism  Influenza vaccine given  Patient Instructions  Check blood sugars on waking up  3/7  Also check blood sugars about 2 hours after a meal and do this after different meals by rotation  Recommended blood sugar levels on waking up is 90-130 and about 2 hours after meal is 130-160  Please bring your blood sugar monitor to each visit, thank you  More walking          [facility] 12/21/2016, 12:50 PM   Note: This office note was prepared with Estate agent. Any transcriptional errors that result from this process are unintentional.

## 2016-12-21 NOTE — Telephone Encounter (Signed)
Called patient and apologized for the delay as it was my early day Friday and I had left hours before the message was sent to me. I asked patient to please let me know which pharmacy she would like for me to send her lantus to.

## 2016-12-21 NOTE — Telephone Encounter (Signed)
Patient came in to the office today and I am going to refill her prescription and send to her local pharmacy.

## 2016-12-22 DIAGNOSIS — M25511 Pain in right shoulder: Secondary | ICD-10-CM | POA: Diagnosis not present

## 2016-12-22 DIAGNOSIS — E119 Type 2 diabetes mellitus without complications: Secondary | ICD-10-CM | POA: Diagnosis not present

## 2016-12-23 DIAGNOSIS — M25511 Pain in right shoulder: Secondary | ICD-10-CM | POA: Diagnosis not present

## 2016-12-29 ENCOUNTER — Ambulatory Visit (AMBULATORY_SURGERY_CENTER): Payer: Medicare Other | Admitting: Gastroenterology

## 2016-12-29 ENCOUNTER — Telehealth: Payer: Self-pay | Admitting: *Deleted

## 2016-12-29 ENCOUNTER — Encounter: Payer: Self-pay | Admitting: Gastroenterology

## 2016-12-29 ENCOUNTER — Other Ambulatory Visit: Payer: Self-pay | Admitting: Gastroenterology

## 2016-12-29 VITALS — BP 119/61 | HR 72 | Temp 97.3°F | Resp 11 | Ht 62.0 in | Wt 169.0 lb

## 2016-12-29 DIAGNOSIS — R1032 Left lower quadrant pain: Secondary | ICD-10-CM

## 2016-12-29 DIAGNOSIS — K639 Disease of intestine, unspecified: Secondary | ICD-10-CM | POA: Diagnosis not present

## 2016-12-29 DIAGNOSIS — R194 Change in bowel habit: Secondary | ICD-10-CM | POA: Diagnosis not present

## 2016-12-29 DIAGNOSIS — K5732 Diverticulitis of large intestine without perforation or abscess without bleeding: Secondary | ICD-10-CM | POA: Diagnosis not present

## 2016-12-29 MED ORDER — SODIUM CHLORIDE 0.9 % IV SOLN: 500.0000 mL | INTRAVENOUS | Status: DC

## 2016-12-29 MED ORDER — DICYCLOMINE HCL 10 MG PO CAPS: 10.0000 mg | ORAL_CAPSULE | Freq: Three times a day (TID) | ORAL | 0 refills | Status: DC | PRN

## 2016-12-29 NOTE — Progress Notes (Signed)
Pt's states no medical or surgical changes since previsit or office visit. 

## 2016-12-29 NOTE — Patient Instructions (Signed)
   AWAIT PATHOLOGY ON BIOPSIES TAKEN TODAY  INFORMATION ON DIVERTICULOSIS GIVEN TO YOU TODAY    YOU HAD AN ENDOSCOPIC PROCEDURE TODAY AT [facility]:   Refer to the procedure report that was given to you for any specific questions about what was found during the examination.  If the procedure report does not answer your questions, please call your gastroenterologist to clarify.  If you requested that your care partner not be given the details of your procedure findings, then the procedure report has been included in a sealed envelope for you to review at your convenience later.  YOU SHOULD EXPECT: Some feelings of bloating in the abdomen. Passage of more gas than usual.  Walking can help get rid of the air that was put into your GI tract during the procedure and reduce the bloating. If you had a lower endoscopy (such as a colonoscopy or flexible sigmoidoscopy) you may notice spotting of blood in your stool or on the toilet paper. If you underwent a bowel prep for your procedure, you may not have a normal bowel movement for a few days.  Please Note:  You might notice some irritation and congestion in your nose or some drainage.  This is from the oxygen used during your procedure.  There is no need for concern and it should clear up in a day or so.  SYMPTOMS TO REPORT IMMEDIATELY:   Following lower endoscopy (colonoscopy or flexible sigmoidoscopy):  Excessive amounts of blood in the stool  Significant tenderness or worsening of abdominal pains  Swelling of the abdomen that is new, acute  Fever of 100F or higher    For urgent or emergent issues, a gastroenterologist can be reached at any hour by calling [phone].   DIET:  We do recommend a small meal at first, but then you may proceed to your regular diet.  Drink plenty of fluids but you should avoid alcoholic beverages for 24 hours.  ACTIVITY:  You should plan to take it easy for the rest of today and you should NOT  DRIVE or use heavy machinery until tomorrow (because of the sedation medicines used during the test).    FOLLOW UP: Our staff will call the number listed on your records the next business day following your procedure to check on you and address any questions or concerns that you may have regarding the information given to you following your procedure. If we do not reach you, we will leave a message.  However, if you are feeling well and you are not experiencing any problems, there is no need to return our call.  We will assume that you have returned to your regular daily activities without incident.  If any biopsies were taken you will be contacted by phone or by letter within the next 1-3 weeks.  Please call us at [phone] if you have not heard about the biopsies in 3 weeks.    SIGNATURES/CONFIDENTIALITY: You and/or your care partner have signed paperwork which will be entered into your electronic medical record.  These signatures attest to the fact that that the information above on your After Visit Summary has been reviewed and is understood.  Full responsibility of the confidentiality of this discharge information lies with you and/or your care-partner.

## 2016-12-29 NOTE — Op Note (Signed)
[facility] Patient Name: [name] [name] Procedure Date: 12/29/2016 2:18 PM MRN: [id number] Endoscopist: Mallie Mussel L. Loletha Carrow , MD Age: 71 Referring MD:  Date of Birth: 1945-05-26 Gender: Female Account #: [id number] Procedure:                Colonoscopy Indications:              Abdominal pain in the left lower quadrant Medicines:                Monitored Anesthesia Care Procedure:                Pre-Anesthesia Assessment:                           - Prior to the procedure, a History and Physical                            was performed, and patient medications and                            allergies were reviewed. The patient's tolerance of                            previous anesthesia was also reviewed. The risks                            and benefits of the procedure and the sedation                            options and risks were discussed with the patient.                            All questions were answered, and informed consent                            was obtained. Prior Anticoagulants: The patient has                            taken no previous anticoagulant or antiplatelet                            agents. ASA Grade Assessment: III - A patient with                            severe systemic disease. After reviewing the risks                            and benefits, the patient was deemed in                            satisfactory condition to undergo the procedure.                           After obtaining informed consent, the colonoscope  was passed under direct vision. Throughout the                            procedure, the patient's blood pressure, pulse, and                            oxygen saturations were monitored continuously. The                            Colonoscope was introduced through the anus and                            advanced to the the cecum, identified by                            appendiceal orifice  and ileocecal valve. The                            colonoscopy was performed without difficulty. The                            patient tolerated the procedure well. The quality                            of the bowel preparation was excellent. The                            ileocecal valve, appendiceal orifice, and rectum                            were photographed. The quality of the bowel                            preparation was evaluated using the BBPS [facility]                            Bowel Preparation Scale) with scores of: Right                            Colon = 3, Transverse Colon = 3 and Left Colon = 3                            (entire mucosa seen well with no residual staining,                            small fragments of stool or opaque liquid). The                            total BBPS score equals 9. The bowel preparation                            used was SUPREP. Scope In: 2:26:03 PM Scope Out: 2:36:55 PM Scope Withdrawal Time: 0 hours 8  minutes 12 seconds  Total Procedure Duration: 0 hours 10 minutes 52 seconds  Findings:                 The perianal and digital rectal examinations were                            normal.                           Multiple medium-mouthed diverticula were found in                            the left colon.                           A patchy area of mildly erythematous mucosa was                            found in the sigmoid colon (Unclear if prep                            artifact or SCAD). Biopsies were taken with a cold                            forceps for histology.                           The exam was otherwise without abnormality on                            direct and retroflexion views. Complications:            No immediate complications. Estimated Blood Loss:     Estimated blood loss: none. Impression:               - Diverticulosis in the left colon.                           - Erythematous mucosa in the sigmoid  colon.                            Biopsied.                           - The examination was otherwise normal on direct                            and retroflexion views. Recommendation:           - Patient has a contact number available for                            emergencies. The signs and symptoms of potential                            delayed complications were discussed with the  patient. Return to normal activities tomorrow.                            Written discharge instructions were provided to the                            patient.                           - Resume previous diet.                           - Continue present medications.                           - Await pathology results.                           - Repeat colonoscopy in 10 years for screening                            purposes. Henry L. Loletha Carrow, MD 12/29/2016 2:42:46 PM This report has been signed electronically.

## 2016-12-29 NOTE — Progress Notes (Signed)
Spontaneous respirations throughout. VSS. Resting comfortably. To PACU on room air. Report to  RN. 

## 2016-12-29 NOTE — Progress Notes (Signed)
Called to room to assist during endoscopic procedure.  Patient ID and intended procedure confirmed with present staff. Received instructions for my participation in the procedure from the performing physician.  

## 2016-12-29 NOTE — Telephone Encounter (Signed)
Patient called directly to [facility] Reception desk and call was transferred to me.  Patient is cramping really bad and has a colonoscopy scheduled today at 230 with Dr. Loletha Carrow.  She was wanting to know if she has to drink her second prep at 930 this morning since she was cramping.  I told her that she should complete the second prep, that some people cramp with taking the prep and it was normal to have cramping.  All questions were answered.

## 2016-12-30 ENCOUNTER — Telehealth: Payer: Self-pay | Admitting: *Deleted

## 2016-12-30 NOTE — Telephone Encounter (Signed)
No answer, message left for the patient. 

## 2017-01-01 DIAGNOSIS — E039 Hypothyroidism, unspecified: Secondary | ICD-10-CM | POA: Diagnosis not present

## 2017-01-01 DIAGNOSIS — E119 Type 2 diabetes mellitus without complications: Secondary | ICD-10-CM | POA: Diagnosis not present

## 2017-01-01 DIAGNOSIS — M545 Low back pain: Secondary | ICD-10-CM | POA: Diagnosis not present

## 2017-01-06 ENCOUNTER — Encounter: Payer: Self-pay | Admitting: Gastroenterology

## 2017-01-14 DIAGNOSIS — M19011 Primary osteoarthritis, right shoulder: Secondary | ICD-10-CM | POA: Diagnosis not present

## 2017-01-14 DIAGNOSIS — M25511 Pain in right shoulder: Secondary | ICD-10-CM | POA: Diagnosis not present

## 2017-01-15 DIAGNOSIS — M545 Low back pain: Secondary | ICD-10-CM | POA: Diagnosis not present

## 2017-01-17 ENCOUNTER — Telehealth: Payer: Self-pay | Admitting: Endocrinology

## 2017-01-18 NOTE — Telephone Encounter (Signed)
Yes, 1 a day

## 2017-01-18 NOTE — Telephone Encounter (Signed)
Please advise if this is the correct Metformin to be taking? Not filled by you yet.

## 2017-01-18 NOTE — Telephone Encounter (Signed)
This medication has been filled with 1 tablet daily per Dr. Dwyane Dee.

## 2017-01-19 DIAGNOSIS — M545 Low back pain: Secondary | ICD-10-CM | POA: Diagnosis not present

## 2017-01-20 DIAGNOSIS — M19011 Primary osteoarthritis, right shoulder: Secondary | ICD-10-CM | POA: Diagnosis not present

## 2017-01-20 DIAGNOSIS — M25511 Pain in right shoulder: Secondary | ICD-10-CM | POA: Diagnosis not present

## 2017-01-25 ENCOUNTER — Other Ambulatory Visit: Payer: Self-pay | Admitting: Endocrinology

## 2017-01-25 DIAGNOSIS — M19011 Primary osteoarthritis, right shoulder: Secondary | ICD-10-CM | POA: Diagnosis not present

## 2017-01-25 DIAGNOSIS — M25511 Pain in right shoulder: Secondary | ICD-10-CM | POA: Diagnosis not present

## 2017-01-29 DIAGNOSIS — R58 Hemorrhage, not elsewhere classified: Secondary | ICD-10-CM | POA: Diagnosis not present

## 2017-01-29 DIAGNOSIS — S70319A Abrasion, unspecified thigh, initial encounter: Secondary | ICD-10-CM | POA: Diagnosis not present

## 2017-02-01 DIAGNOSIS — M545 Low back pain: Secondary | ICD-10-CM | POA: Diagnosis not present

## 2017-02-01 DIAGNOSIS — M19011 Primary osteoarthritis, right shoulder: Secondary | ICD-10-CM | POA: Diagnosis not present

## 2017-02-01 DIAGNOSIS — R58 Hemorrhage, not elsewhere classified: Secondary | ICD-10-CM | POA: Diagnosis not present

## 2017-02-01 DIAGNOSIS — M25511 Pain in right shoulder: Secondary | ICD-10-CM | POA: Diagnosis not present

## 2017-02-01 DIAGNOSIS — S70319A Abrasion, unspecified thigh, initial encounter: Secondary | ICD-10-CM | POA: Diagnosis not present

## 2017-02-03 ENCOUNTER — Other Ambulatory Visit: Payer: Self-pay | Admitting: Endocrinology

## 2017-02-03 DIAGNOSIS — M19011 Primary osteoarthritis, right shoulder: Secondary | ICD-10-CM | POA: Diagnosis not present

## 2017-02-03 DIAGNOSIS — M25511 Pain in right shoulder: Secondary | ICD-10-CM | POA: Diagnosis not present

## 2017-02-03 DIAGNOSIS — M545 Low back pain: Secondary | ICD-10-CM | POA: Diagnosis not present

## 2017-02-03 DIAGNOSIS — M67911 Unspecified disorder of synovium and tendon, right shoulder: Secondary | ICD-10-CM | POA: Diagnosis not present

## 2017-02-06 DIAGNOSIS — M542 Cervicalgia: Secondary | ICD-10-CM | POA: Diagnosis not present

## 2017-02-06 DIAGNOSIS — M545 Low back pain: Secondary | ICD-10-CM | POA: Diagnosis not present

## 2017-02-06 DIAGNOSIS — M25511 Pain in right shoulder: Secondary | ICD-10-CM | POA: Diagnosis not present

## 2017-02-08 DIAGNOSIS — F319 Bipolar disorder, unspecified: Secondary | ICD-10-CM | POA: Diagnosis not present

## 2017-02-10 DIAGNOSIS — M542 Cervicalgia: Secondary | ICD-10-CM | POA: Diagnosis not present

## 2017-02-10 DIAGNOSIS — M545 Low back pain: Secondary | ICD-10-CM | POA: Diagnosis not present

## 2017-02-20 DIAGNOSIS — M542 Cervicalgia: Secondary | ICD-10-CM | POA: Diagnosis not present

## 2017-02-20 DIAGNOSIS — M25511 Pain in right shoulder: Secondary | ICD-10-CM | POA: Diagnosis not present

## 2017-02-20 DIAGNOSIS — M545 Low back pain: Secondary | ICD-10-CM | POA: Diagnosis not present

## 2017-02-25 DIAGNOSIS — F3112 Bipolar disorder, current episode manic without psychotic features, moderate: Secondary | ICD-10-CM | POA: Diagnosis not present

## 2017-02-25 DIAGNOSIS — Z79899 Other long term (current) drug therapy: Secondary | ICD-10-CM | POA: Diagnosis not present

## 2017-03-10 DIAGNOSIS — M542 Cervicalgia: Secondary | ICD-10-CM | POA: Diagnosis not present

## 2017-03-10 DIAGNOSIS — M545 Low back pain: Secondary | ICD-10-CM | POA: Diagnosis not present

## 2017-03-13 ENCOUNTER — Emergency Department (HOSPITAL_COMMUNITY): Payer: Medicare Other

## 2017-03-13 ENCOUNTER — Encounter (HOSPITAL_COMMUNITY): Payer: Self-pay | Admitting: Emergency Medicine

## 2017-03-13 ENCOUNTER — Emergency Department (HOSPITAL_COMMUNITY)
Admission: EM | Admit: 2017-03-13 | Discharge: 2017-03-13 | Disposition: A | Discharge status: Home / Self Care | Payer: Medicare Other | Attending: Emergency Medicine | Admitting: Emergency Medicine

## 2017-03-13 DIAGNOSIS — N3000 Acute cystitis without hematuria: Secondary | ICD-10-CM | POA: Insufficient documentation

## 2017-03-13 DIAGNOSIS — Z794 Long term (current) use of insulin: Secondary | ICD-10-CM | POA: Insufficient documentation

## 2017-03-13 DIAGNOSIS — Z7984 Long term (current) use of oral hypoglycemic drugs: Secondary | ICD-10-CM | POA: Insufficient documentation

## 2017-03-13 DIAGNOSIS — I10 Essential (primary) hypertension: Secondary | ICD-10-CM | POA: Diagnosis not present

## 2017-03-13 DIAGNOSIS — Z96641 Presence of right artificial hip joint: Secondary | ICD-10-CM | POA: Insufficient documentation

## 2017-03-13 DIAGNOSIS — E119 Type 2 diabetes mellitus without complications: Secondary | ICD-10-CM | POA: Insufficient documentation

## 2017-03-13 DIAGNOSIS — E039 Hypothyroidism, unspecified: Secondary | ICD-10-CM | POA: Diagnosis not present

## 2017-03-13 DIAGNOSIS — R1084 Generalized abdominal pain: Secondary | ICD-10-CM | POA: Diagnosis not present

## 2017-03-13 DIAGNOSIS — R109 Unspecified abdominal pain: Secondary | ICD-10-CM | POA: Diagnosis not present

## 2017-03-13 LAB — COMPREHENSIVE METABOLIC PANEL
ALT: 13 U/L — ABNORMAL LOW (ref 14–54)
AST: 32 U/L (ref 15–41)
Albumin: 3.5 g/dL (ref 3.5–5.0)
Alkaline Phosphatase: 77 U/L (ref 38–126)
Anion gap: 6 (ref 5–15)
BUN: 11 mg/dL (ref 6–20)
CO2: 24 mmol/L (ref 22–32)
Calcium: 9.5 mg/dL (ref 8.9–10.3)
Chloride: 108 mmol/L (ref 101–111)
Creatinine, Ser: 0.9 mg/dL (ref 0.44–1.00)
GFR calc Af Amer: >60 mL/min (ref >60)
GFR calc non Af Amer: >60 mL/min (ref >60)
Glucose, Bld: 91 mg/dL (ref 65–99)
Potassium: 4.9 mmol/L (ref 3.5–5.1)
Sodium: 138 mmol/L (ref 135–145)
Total Bilirubin: 1 mg/dL (ref 0.3–1.2)
Total Protein: 6.5 g/dL (ref 6.5–8.1)

## 2017-03-13 LAB — CBC
HCT: 34.9 % — ABNORMAL LOW (ref 36.0–46.0)
Hemoglobin: 10.8 g/dL — ABNORMAL LOW (ref 12.0–15.0)
MCH: 27.2 pg (ref 26.0–34.0)
MCHC: 30.9 g/dL (ref 30.0–36.0)
MCV: 87.9 fL (ref 78.0–100.0)
Platelets: 187 10*3/uL (ref 150–400)
RBC: 3.97 MIL/uL (ref 3.87–5.11)
RDW: 16.3 % — ABNORMAL HIGH (ref 11.5–15.5)
WBC: 11.6 10*3/uL — ABNORMAL HIGH (ref 4.0–10.5)

## 2017-03-13 LAB — I-STAT CG4 LACTIC ACID, ED: Lactic Acid, Venous: 1.54 mmol/L (ref 0.5–1.9)

## 2017-03-13 LAB — I-STAT TROPONIN, ED: Troponin i, poc: 0 ng/mL (ref 0.00–0.08)

## 2017-03-13 LAB — URINALYSIS, ROUTINE W REFLEX MICROSCOPIC
Bilirubin Urine: NEGATIVE
Glucose, UA: NEGATIVE mg/dL
Ketones, ur: NEGATIVE mg/dL
Nitrite: NEGATIVE
Protein, ur: NEGATIVE mg/dL
Specific Gravity, Urine: 1.01 (ref 1.005–1.030)
pH: 6 (ref 5.0–8.0)

## 2017-03-13 LAB — LIPASE, BLOOD: Lipase: 23 U/L (ref 11–51)

## 2017-03-13 MED ORDER — PHENAZOPYRIDINE HCL 200 MG PO TABS: 200.0000 mg | ORAL_TABLET | Freq: Three times a day (TID) | ORAL | 0 refills | Status: DC

## 2017-03-13 MED ORDER — FENTANYL CITRATE (PF) 100 MCG/2ML IJ SOLN
50.0000 ug | Freq: Once | INTRAMUSCULAR | Status: AC
  Administered 2017-03-13: 50 ug via INTRAVENOUS
  Filled 2017-03-13: qty 2

## 2017-03-13 MED ORDER — SODIUM CHLORIDE 0.9 % IV BOLUS (SEPSIS)
1000.0000 mL | Freq: Once | INTRAVENOUS | Status: AC
  Administered 2017-03-13: 1000 mL via INTRAVENOUS

## 2017-03-13 MED ORDER — ONDANSETRON 8 MG PO TBDP: 8.0000 mg | ORAL_TABLET | Freq: Three times a day (TID) | ORAL | 0 refills | Status: DC | PRN

## 2017-03-13 MED ORDER — FENTANYL CITRATE (PF) 100 MCG/2ML IJ SOLN
50.0000 ug | Freq: Once | INTRAMUSCULAR | Status: AC
Start: 2017-03-13 — End: 2017-03-13
  Administered 2017-03-13: 50 ug via INTRAVENOUS
  Filled 2017-03-13: qty 2

## 2017-03-13 MED ORDER — DEXTROSE 5 % IV SOLN
1.0000 g | Freq: Once | INTRAVENOUS | Status: AC
  Administered 2017-03-13: 1 g via INTRAVENOUS
  Filled 2017-03-13: qty 10

## 2017-03-13 MED ORDER — SODIUM CHLORIDE 0.9 % IV SOLN
INTRAVENOUS | Status: DC
  Administered 2017-03-13: 11:00:00 via INTRAVENOUS

## 2017-03-13 MED ORDER — ONDANSETRON HCL 4 MG/2ML IJ SOLN
4.0000 mg | Freq: Once | INTRAMUSCULAR | Status: AC
  Administered 2017-03-13: 4 mg via INTRAVENOUS
  Filled 2017-03-13: qty 2

## 2017-03-13 MED ORDER — TRAMADOL HCL 50 MG PO TABS: 50.0000 mg | ORAL_TABLET | Freq: Four times a day (QID) | ORAL | 0 refills | Status: DC | PRN

## 2017-03-13 MED ORDER — CEPHALEXIN 500 MG PO CAPS: 500.0000 mg | ORAL_CAPSULE | Freq: Two times a day (BID) | ORAL | 0 refills | Status: DC

## 2017-03-13 MED ORDER — IOPAMIDOL (ISOVUE-300) INJECTION 61%
INTRAVENOUS | Status: AC
  Administered 2017-03-13: 100 mL via INTRAVENOUS
  Filled 2017-03-13: qty 100

## 2017-03-13 NOTE — ED Notes (Signed)
U/A per MD order. Pt provided labeled specimen cup/cx, offered assistance w/collection PRN. ENMiles 

## 2017-03-13 NOTE — ED Triage Notes (Signed)
Per [facility] pt from home for diarrhea x 2 days with n/v yesterday and abd cramping. Hx colitis and diverticulitis 3 months ago.

## 2017-03-13 NOTE — ED Provider Notes (Signed)
[facility] DEPT Provider Note   CSN: [id number] Arrival date & time: 03/13/17  0758     History   Chief Complaint Chief Complaint  Patient presents with  . Abdominal Pain  . Diarrhea  . Emesis    HPI [name] [name] is a 72 y.o. female.  HPI Patient presents to the emergency room for evaluation of abdominal pain vomiting and diarrhea.  Patient has a history of colitis as well as diverticulitis.  Patient states she started having trouble with abdominal pain that started a couple of days ago.  She primarily has had a lot of loose stools and abdominal cramping.  Pain is very similar to her previous bouts of diverticulitis and colitis.  Pain is more on the lower right side but it is diffusely throughout her abdomen.  She has had one episode of vomiting but not many episodes.  She denies any blood in her stool.  Patient mentions having an episode 2 days ago with pain in her right arm as well as chest.  That episode was brief and resolved.  She has not had any since Past Medical History:  Diagnosis Date  . Alcohol abuse   . Anxiety    takes Valium daily as needed and Ativan daily  . Bilateral hearing loss   . Bipolar 1 disorder ([place])    takes Lithium nightly and Synthroid daily  . Chronic back pain    DDD  . Colitis, ischemic ([place]) 2012  . Confusion    r/t meds  . Depression    takes Prozac daily and Bupspirone   . Diabetes ([place])   . Diabetes mellitus without complication ([place])    takes Janumet daily  . Diverticulosis   . Diverticulosis   . Dyslipidemia    takes Crestor daily  . Fibromyalgia   . Hepatic steatosis 06/18/12   severe  . History of bronchitis    last time at least 73yrs ago  . Hyperlipidemia   . Hypothyroidism   . IBS (irritable bowel syndrome)   . Ischemic colitis ([place])   . Joint pain   . Joint swelling   . Lupus    tumid-skin  . Nephrolithiasis   . Numbness    in right foot  . Osteoarthritis    in hips  .  Osteoarthritis cervical spine   . Osteoarthritis of hand    bilateral  . Restless leg syndrome    takes Requip nightly  . Sciatica   . Urinary frequency   . Urinary leakage   . Urinary urgency   . Urinary, incontinence, stress female   . Walking pneumonia    last time more than 37yrs ago    Patient Active Problem List   Diagnosis Date Noted  . Diverticulitis 11/19/2016  . Hypothyroidism 07/14/2016  . Diarrhea   . Generalized abdominal pain   . Major depressive disorder, recurrent severe without psychotic features ([place]) 12/31/2015  . Nausea without vomiting   . Colitis 10/28/2015  . Unintentional poisoning by psychotropic drug 07/05/2015  . Restless leg syndrome 07/05/2015  . Diabetic polyneuropathy associated with type 2 diabetes mellitus ([place]) 09/21/2014  . Arthritis of left hip 06/09/2013  . Arthritis of right hip 06/08/2013  . Hip pain, right 05/02/2013  . IBS (irritable bowel syndrome) 01/02/2013  . Unspecified vitamin D deficiency 07/05/2012  . Pure hyperglyceridemia 07/04/2012  . Diabetes mellitus with diabetic neuropathy, without long-term current use of insulin ([place]) 06/18/2012  . Hypertension 06/18/2012  . Bipolar 1  disorder ([place]) 06/18/2012  . Low back pain 02/25/2011  . Acute ischemic colitis ([place]) 01/06/2011  . Sciatica 12/19/2010  . Fibromyalgia 10/28/2010  . Primary osteoarthritis of right hip 10/24/2010  . Hyperlipidemia with target low density lipoprotein (LDL) cholesterol less than 100 mg/dL 05/05/2010  . DEPRESSION/ANXIETY 05/05/2010  . ABUSE, ALCOHOL, IN REMISSION 05/05/2010  . OTITIS MEDIA, CHRONIC 05/05/2010  . Hearing loss 05/05/2010  . ALLERGIC RHINITIS, SEASONAL, MILD 05/05/2010  . URINARY INCONTINENCE, STRESS, FEMALE 05/05/2010  . OSTEOARTHRITIS, HANDS, BILATERAL 05/05/2010  . OSTEOARTHRITIS, CERVICAL SPINE 05/05/2010    Past Surgical History:  Procedure Laterality Date  . APPENDECTOMY    . COLONOSCOPY    . D&C x 4    . DENTAL SURGERY  Left 2 weeks ago    dental implant  . ESOPHAGOGASTRODUODENOSCOPY    . excision of tumors at right neck     angle of jaw '68, benign  . FLEXIBLE SIGMOIDOSCOPY N/A 06/21/2012   Procedure: FLEXIBLE SIGMOIDOSCOPY;  Surgeon: Jerene Bears, MD;  Location: [facility];  Service: Gastroenterology;  Laterality: N/A;  . PARTIAL HYSTERECTOMY    . TONSILLECTOMY    . TOTAL HIP ARTHROPLASTY Right 06/09/2013   Procedure: TOTAL HIP ARTHROPLASTY;  Surgeon: Kerin Salen, MD;  Location: [facility];  Service: Orthopedics;  Laterality: Right;  . TUBAL LIGATION      OB History    No data available       Home Medications    Prior to Admission medications   Medication Sig Start Date End Date Taking? Authorizing Provider  ALPRAZolam Duanne Moron) 0.5 MG tablet Take 2 mg by mouth 2 (two) times daily as needed for anxiety.   Yes [provider]  atorvastatin (LIPITOR) 40 MG tablet Take 40 mg by mouth at bedtime.    Yes [provider]  baclofen (LIORESAL) 10 MG tablet Take 20 mg by mouth 3 (three) times daily as needed for muscle spasms.   Yes [provider]  Biotin 1000 MCG tablet Take 2,000 mcg by mouth 3 (three) times daily.   Yes [provider]  cholecalciferol (VITAMIN D) 1000 units tablet Take 2,000 Units by mouth daily.    Yes [provider]  divalproex (DEPAKOTE ER) 250 MG 24 hr tablet Take 500 mg by mouth at bedtime.    Yes [provider]  donepezil (ARICEPT) 10 MG tablet donepezil 10 mg tablet every evening   Yes [provider]  gabapentin (NEURONTIN) 100 MG capsule Take 300 mg by mouth 3 (three) times daily.    Yes [provider]  glucose blood (CONTOUR NEXT TEST) test strip Use to test blood sugar 2 times daily 12/21/16  Yes Elayne Snare, MD  HUMALOG KWIKPEN 100 UNIT/ML KiwkPen INJECT 6 UNITS AT [facility] AND LUNCH AND 8 UNITS BEFORE SUPPER 02/03/17  Yes Elayne Snare, MD  insulin glargine (LANTUS) 100 unit/mL SOPN Inject 0.34 mLs (34  Units total) into the skin at bedtime. 12/21/16  Yes Elayne Snare, MD  levothyroxine (SYNTHROID, LEVOTHROID) 75 MCG tablet Take 1 tablet (75 mcg total) by mouth daily before breakfast. I tablet daily before breakfast 05/29/16  Yes Elayne Snare, MD  lithium carbonate (ESKALITH) 450 MG CR tablet Take 450 mg by mouth at bedtime.   Yes [provider]  LORazepam (ATIVAN) 2 MG tablet Take 0.5 mg by mouth every 8 (eight) hours as needed for anxiety. Takes a 0.25 in the morning, then takes 0.5 twice daily   Yes [provider]  losartan (COZAAR) 50 MG tablet Take 50 mg by mouth daily.   Yes [provider]  Melatonin 3 MG TABS Take 9 mg by mouth at bedtime.   Yes [provider]  memantine (NAMENDA) 10 MG tablet Take 10 mg by mouth every evening.    Yes [provider]  metFORMIN (GLUCOPHAGE-XR) 500 MG 24 hr tablet Take 1 tablet (500 mg total) daily with breakfast by mouth. Patient taking differently: Take 500 mg by mouth every evening.  01/18/17  Yes Elayne Snare, MD  OZEMPIC 0.25 or 0.5 MG/DOSE SOPN INJECT 0.5 MG INTO THE SKIN ONCE WEEKLY 01/25/17  Yes Elayne Snare, MD  polyethylene glycol [facility]) packet Take 17 g by mouth daily as needed. 11/20/16  Yes Lavina Hamman, MD  QUEtiapine (SEROQUEL) 200 MG tablet Take 100 mg by mouth at 5 pm and 200 mg by mouth at bedtime   Yes [provider]  rOPINIRole (REQUIP) 0.5 MG tablet Take 1.5 mg by mouth 2 (two) times daily. At 5 PM and 7:30 PM   Yes [provider]  zaleplon (SONATA) 5 MG capsule Take 5 mg by mouth every other day.   Yes [provider]  cephALEXin (KEFLEX) 500 MG capsule Take 1 capsule (500 mg total) by mouth 2 (two) times daily. 03/13/17   Dorie Rank, MD  dicyclomine (BENTYL) 10 MG capsule Take 1 capsule (10 mg total) by mouth every 8 (eight) hours as needed for spasms. Patient not taking: Reported on 03/13/2017 12/29/16   [name], MD  IBU 800 MG tablet Take 800 mg by  mouth 3 (three) times daily as needed for pain. 02/10/17   [provider]  ondansetron (ZOFRAN ODT) 8 MG disintegrating tablet Take 1 tablet (8 mg total) by mouth every 8 (eight) hours as needed for nausea or vomiting. 03/13/17   Dorie Rank, MD  ondansetron (ZOFRAN) 4 MG tablet Take 1 tablet (4 mg total) by mouth every 8 (eight) hours as needed for nausea or vomiting. Patient not taking: Reported on 03/13/2017 11/20/16   Lavina Hamman, MD  oxyCODONE (OXY IR/ROXICODONE) 5 MG immediate release tablet Take 1 tablet (5 mg total) by mouth every 6 (six) hours as needed for severe pain. Patient not taking: Reported on 03/13/2017 11/20/16   Lavina Hamman, MD  phenazopyridine (PYRIDIUM) 200 MG tablet Take 1 tablet (200 mg total) by mouth 3 (three) times daily. 03/13/17   Dorie Rank, MD  traMADol (ULTRAM) 50 MG tablet Take 1 tablet (50 mg total) by mouth every 6 (six) hours as needed. 03/13/17   Dorie Rank, MD    Family History Family History  Problem Relation Age of Onset  . Drug abuse Mother   . Alcohol abuse Mother   . Alcohol abuse Father   . Alcohol abuse Brother        x 2    Social History Social History   Tobacco Use  . Smoking status: Never Smoker  . Smokeless tobacco: Never Used  Substance Use Topics  . Alcohol use: No  . Drug use: No     Allergies   Codeine; Macrolides and ketolides; Procaine hcl; Aspirin; Benadryl [diphenhydramine]; Diflucan [fluconazole]; Dilaudid [hydromorphone hcl]; Morphine and related; Other; and Sulfa antibiotics   Review of Systems Review of Systems  Constitutional: Negative for fever.  Genitourinary: Negative for dysuria.  All other systems reviewed and are negative.    Physical Exam Updated Vital Signs BP (!) 96/56   Pulse 78  Temp 98 F (36.7 C) (Oral)   Resp 16   SpO2 99%   Physical Exam  Constitutional: She appears well-developed and well-nourished. No distress.  HENT:  Head: Normocephalic and atraumatic.  Right Ear: External  ear normal.  Left Ear: External ear normal.  Eyes: Conjunctivae are normal. Right eye exhibits no discharge. Left eye exhibits no discharge. No scleral icterus.  Neck: Neck supple. No tracheal deviation present.  Cardiovascular: Normal rate, regular rhythm and intact distal pulses.  Pulmonary/Chest: Effort normal and breath sounds normal. No stridor. No respiratory distress. She has no wheezes. She has no rales.  Abdominal: Soft. Bowel sounds are normal. She exhibits no distension. There is generalized tenderness. There is no rebound and no guarding. No hernia.  Musculoskeletal: She exhibits no edema or tenderness.  Neurological: She is alert. She has normal strength. No cranial nerve deficit (no facial droop, extraocular movements intact, no slurred speech) or sensory deficit. She exhibits normal muscle tone. She displays no seizure activity. Coordination normal.  Skin: Skin is warm and dry. No rash noted.  Psychiatric: She has a normal mood and affect.  Nursing note and vitals reviewed.    ED Treatments / Results  Labs (all labs ordered are listed, but only abnormal results are displayed) Labs Reviewed  COMPREHENSIVE METABOLIC PANEL - Abnormal; Notable for the following components:      Result Value   ALT 13 (*)    All other components within normal limits  URINALYSIS, ROUTINE W REFLEX MICROSCOPIC - Abnormal; Notable for the following components:   APPearance HAZY (*)    Hgb urine dipstick SMALL (*)    Leukocytes, UA LARGE (*)    Bacteria, UA MANY (*)    Squamous Epithelial / LPF 0-5 (*)    All other components within normal limits  CBC - Abnormal; Notable for the following components:   WBC 11.6 (*)    Hemoglobin 10.8 (*)    HCT 34.9 (*)    RDW 16.3 (*)    All other components within normal limits  LIPASE, BLOOD  I-STAT CG4 LACTIC ACID, ED  I-STAT TROPONIN, ED    EKG  EKG Interpretation  Date/Time:  Saturday March 13 2017 11:59:42 EST Ventricular Rate:  76 PR  Interval:    QRS Duration: 91 QT Interval:  411 QTC Calculation: 463 R Axis:   50 Text Interpretation:  Sinus rhythm Low voltage, precordial leads No significant change since last tracing Confirmed by Dorie Rank [phone]) on 03/13/2017 12:02:46 PM       Radiology Ct Abdomen Pelvis W Contrast  Result Date: 03/13/2017 CLINICAL DATA:  72 year old female with abdominal pain, diarrhea and nausea and vomiting for several days. EXAM: CT ABDOMEN AND PELVIS WITH CONTRAST TECHNIQUE: Multidetector CT imaging of the abdomen and pelvis was performed using the standard protocol following bolus administration of intravenous contrast. CONTRAST:  100 cc intravenous Isovue-300 COMPARISON:  11/19/2016 and prior CTs FINDINGS: Lower chest: No acute abnormalities. The liver and gallbladder are unremarkable. No biliary dilatation. A 5 mm left lower lobe nodule is unchanged from 2016-benign. Hepatobiliary: The liver and gallbladder are unremarkable. No biliary dilatation. Pancreas: Unremarkable Spleen: Unremarkable Adrenals/Urinary Tract: Mild circumferential bladder wall thickening is noted. No significant renal or adrenal abnormalities except for areas of right renal scarring. Stomach/Bowel: Stomach is within normal limits. No evidence of bowel wall thickening, distention, or inflammatory changes. Vascular/Lymphatic: Aortic atherosclerosis. No enlarged abdominal or pelvic lymph nodes. Reproductive: Status post hysterectomy. No adnexal masses. Other: No ascites, abscess  or pneumoperitoneum. Musculoskeletal: No acute abnormalities or suspicious bony lesions. Right hip replacement changes noted. IMPRESSION: 1. Mild bladder wall thickening-correlate with cystitis. 2. No other acute abnormalities. 3.  Aortic Atherosclerosis (ICD10-I70.0). Electronically Signed   By: Margarette Canada M.D.   On: 03/13/2017 14:01    Procedures Procedures (including critical care time)  Medications Ordered in ED Medications  sodium chloride 0.9 % bolus  1,000 mL (0 mLs Intravenous Stopped 03/13/17 1231)    And  0.9 %  sodium chloride infusion ( Intravenous New Bag/Given 03/13/17 1118)  ondansetron (ZOFRAN) injection 4 mg (4 mg Intravenous Given 03/13/17 1117)  fentaNYL (SUBLIMAZE) injection 50 mcg (50 mcg Intravenous Given 03/13/17 1117)  cefTRIAXone (ROCEPHIN) 1 g in dextrose 5 % 50 mL IVPB (0 g Intravenous Stopped 03/13/17 1446)  iopamidol (ISOVUE-300) 61 % injection (100 mLs Intravenous Contrast Given 03/13/17 1338)  fentaNYL (SUBLIMAZE) injection 50 mcg (50 mcg Intravenous Given 03/13/17 1354)     Initial Impression / Assessment and Plan / ED Course  I have reviewed the triage vital signs and the nursing notes.  Pertinent labs & imaging results that were available during my care of the patient were reviewed by me and considered in my medical decision making (see chart for details).  Clinical Course as of Mar 13 1548  Sat Mar 13, 2017  1043 Patient presents the emergency for evaluation of abdominal pain.  According to the medical record she has a history of ischemic colitis.  Will add on a lactic acid level  [JK]    Clinical Course User Index [JK] Dorie Rank, MD    Patient presented to the emergency room for evaluation of abdominal pain.  Her laboratory tests suggested a urinary tract infection.  However considering her pain and history of ischemic colitis we did a CT scan of the abdomen pelvis.  CT scan is negative for acute pathology other than some bladder inflammation that goes along with her urinary tract infection.  Patient is not tachycardic.  Lactic acid level is normal.  She said some soft blood pressures but I do not think these are clinically significant.  I doubt sepsis.  I will discharge home with antibiotics.  Discussed close follow-up and reasons to return to the emergency room  Final Clinical Impressions(s) / ED Diagnoses   Final diagnoses:  Acute cystitis without hematuria    ED Discharge Orders        Ordered    cephALEXin  (KEFLEX) 500 MG capsule  2 times daily     03/13/17 1549    traMADol (ULTRAM) 50 MG tablet  Every 6 hours PRN     03/13/17 1549    phenazopyridine (PYRIDIUM) 200 MG tablet  3 times daily     03/13/17 1549    ondansetron (ZOFRAN ODT) 8 MG disintegrating tablet  Every 8 hours PRN     03/13/17 1549       Dorie Rank, MD 03/13/17 1551

## 2017-03-13 NOTE — Discharge Instructions (Signed)
Take the antibiotics as prescribed, follow up with your doctor next week to be rechecked, return for worsening symptoms

## 2017-03-15 DIAGNOSIS — M5412 Radiculopathy, cervical region: Secondary | ICD-10-CM | POA: Diagnosis not present

## 2017-03-18 DIAGNOSIS — R197 Diarrhea, unspecified: Secondary | ICD-10-CM | POA: Diagnosis not present

## 2017-03-18 DIAGNOSIS — R110 Nausea: Secondary | ICD-10-CM | POA: Diagnosis not present

## 2017-03-18 DIAGNOSIS — R109 Unspecified abdominal pain: Secondary | ICD-10-CM | POA: Diagnosis not present

## 2017-03-18 DIAGNOSIS — R300 Dysuria: Secondary | ICD-10-CM | POA: Diagnosis not present

## 2017-03-19 ENCOUNTER — Other Ambulatory Visit: Payer: Medicare Other

## 2017-03-22 ENCOUNTER — Other Ambulatory Visit (INDEPENDENT_AMBULATORY_CARE_PROVIDER_SITE_OTHER): Payer: Medicare Other

## 2017-03-22 DIAGNOSIS — E039 Hypothyroidism, unspecified: Secondary | ICD-10-CM

## 2017-03-22 DIAGNOSIS — E119 Type 2 diabetes mellitus without complications: Secondary | ICD-10-CM | POA: Diagnosis not present

## 2017-03-22 DIAGNOSIS — R5381 Other malaise: Secondary | ICD-10-CM | POA: Diagnosis not present

## 2017-03-22 DIAGNOSIS — Z794 Long term (current) use of insulin: Secondary | ICD-10-CM

## 2017-03-22 DIAGNOSIS — E1165 Type 2 diabetes mellitus with hyperglycemia: Secondary | ICD-10-CM | POA: Diagnosis not present

## 2017-03-22 DIAGNOSIS — R109 Unspecified abdominal pain: Secondary | ICD-10-CM | POA: Diagnosis not present

## 2017-03-22 DIAGNOSIS — F319 Bipolar disorder, unspecified: Secondary | ICD-10-CM | POA: Diagnosis not present

## 2017-03-22 LAB — TSH: TSH: 3.86 u[IU]/mL (ref 0.35–4.50)

## 2017-03-22 LAB — COMPREHENSIVE METABOLIC PANEL
ALT: 12 U/L (ref 0–35)
AST: 14 U/L (ref 0–37)
Albumin: 4 g/dL (ref 3.5–5.2)
Alkaline Phosphatase: 78 U/L (ref 39–117)
BUN: 7 mg/dL (ref 6–23)
CO2: 27 mEq/L (ref 19–32)
Calcium: 9.7 mg/dL (ref 8.4–10.5)
Chloride: 103 mEq/L (ref 96–112)
Creatinine, Ser: 0.88 mg/dL (ref 0.40–1.20)
GFR: 67.68 mL/min (ref >60.00)
Glucose, Bld: 167 mg/dL — ABNORMAL HIGH (ref 70–99)
Potassium: 4.4 mEq/L (ref 3.5–5.1)
Sodium: 138 mEq/L (ref 135–145)
Total Bilirubin: 0.4 mg/dL (ref 0.2–1.2)
Total Protein: 6.5 g/dL (ref 6.0–8.3)

## 2017-03-22 LAB — T3, FREE: T3, Free: 3 pg/mL (ref 2.3–4.2)

## 2017-03-22 LAB — T4, FREE: Free T4: 1.03 ng/dL (ref 0.60–1.60)

## 2017-03-22 LAB — HEMOGLOBIN A1C: Hgb A1c MFr Bld: 6.5 % (ref 4.6–6.5)

## 2017-03-23 ENCOUNTER — Ambulatory Visit (INDEPENDENT_AMBULATORY_CARE_PROVIDER_SITE_OTHER): Payer: Medicare Other | Admitting: Endocrinology

## 2017-03-23 ENCOUNTER — Encounter: Payer: Self-pay | Admitting: Endocrinology

## 2017-03-23 VITALS — BP 124/70 | HR 84 | Ht 62.0 in | Wt 167.0 lb

## 2017-03-23 DIAGNOSIS — E1165 Type 2 diabetes mellitus with hyperglycemia: Secondary | ICD-10-CM

## 2017-03-23 DIAGNOSIS — R531 Weakness: Secondary | ICD-10-CM | POA: Diagnosis not present

## 2017-03-23 DIAGNOSIS — F319 Bipolar disorder, unspecified: Secondary | ICD-10-CM | POA: Diagnosis not present

## 2017-03-23 DIAGNOSIS — E782 Mixed hyperlipidemia: Secondary | ICD-10-CM

## 2017-03-23 DIAGNOSIS — Z794 Long term (current) use of insulin: Secondary | ICD-10-CM | POA: Diagnosis not present

## 2017-03-23 NOTE — Patient Instructions (Addendum)
Stop metformin  MUST CHECK sugars after supper  Check blood sugars on waking up  3x weekly  Also check blood sugars about 2 hours after a meal and do this after different meals by rotation  Recommended blood sugar levels on waking up is 90-130 and about 2 hours after meal is 130-160  Please bring your blood sugar monitor to each visit, thank you  Cut Losartan in 1/2 for a few days

## 2017-03-23 NOTE — Progress Notes (Signed)
Patient ID: [name] [name], female   DOB: 25-Oct-1945, 72 y.o.   MRN: [id number]            Reason for Appointment: Follow-up for Type 2 Diabetes   History of Present Illness:          Date of diagnosis of type 2 diabetes mellitus: ?  2014        Background history:   She thinks her blood sugar was 400 at the time of diagnosis but no detailed records of this are available She did have an A1c of 10.6 done in 2014 and was probably given Lantus for some time initially Apparently she was treated with various medications including metformin, Janumet and Tradgenta. Her blood sugars had improved and A1c in 2015 was down to 6.2 She tends to have diarrhea with metformin and Janumet and also she thinks it causes dry mouth Most likely Janumet was stopped in 06/2015 Glipizide was started in 8/17 when blood sugars were higher and A1c was 9%  Recent history:   INSULIN regimen is:   Lantus 34 units daily at 5 pm  Humalog 6 before breakfast--8  before dinner-4 hs  Non-insulin hypoglycemic drugs the patient is taking are: Ozempic 0.5 mg weekly , metformin ER 500 mg daily   Her A1c previously was consistently over 8%, now again below 7% at 6.5   Current management, blood sugar patterns and problems identified:  Her blood sugars are being checked mostly in the mornings before breakfast and she forgets to them after eating  She does have some variability in her blood sugars but not consistently high in the last few days  She says that she was eating a lot of sweets over the holidays and now she is doing better  Surprisingly her blood sugar control is not worse  Recently she is trying to do better with her diet, has seen the dietitian in 12/2016 has not gained any weight recently  She is still not able to do any physical activity because of various issues  Has been fairly consistent with taking her mealtime insulin  No hypoglycemia also   Side effects from medications have been:?  Diarrhea  from metformin and Janumet  Compliance with the medical regimen: Inconsistent  Glucose monitoring:  done 1 time a day or less        Glucometer: Contour        Blood Glucose readings by time of day and averages from monitor download   PRE-MEAL Fasting Lunch Dinner Bedtime Overall  Glucose range: 109-162      Mean/median: 140    140   POST-MEAL PC Breakfast PC Lunch PC Dinner  Glucose range:     Mean/median:  133    Self-care: The diet that the patient has been following [id number], tries to limit drinks with sugar .      Typical meal intake: Breakfast at 9,  May be eggs and sausage and lunch is usually a sandwich   Dinner 6 pm               Dietician visit, most recent: 10/18               Exercise:  a little walking   Weight history:  Wt Readings from Last 3 Encounters:  03/23/17 167 lb (75.8 kg)  12/29/16 169 lb (76.7 kg)  12/21/16 169 lb 6.4 oz (76.8 kg)    Glycemic control:   Lab Results  Component Value Date   HGBA1C 6.5  03/22/2017   HGBA1C 6.3 12/17/2016   HGBA1C 6.5 (H) 11/19/2016   Lab Results  Component Value Date   MICROALBUR <0.7 07/10/2016   CREATININE 0.88 03/22/2017   Lab Results  Component Value Date   MICRALBCREAT 0.5 07/10/2016       Allergies as of 03/23/2017      Reactions   Codeine Anxiety, Other (See Comments)   Reaction:  Hallucinations   Macrolides And Ketolides Nausea And Vomiting   Procaine Hcl Palpitations   Aspirin Nausea And Vomiting   Reaction:  Burns pts stomach    Benadryl [diphenhydramine]    Per MD "inhibits potency of gabapentin, lithium etc"   Diflucan [fluconazole] Other (See Comments)   Reaction:  Unknown    Dilaudid [hydromorphone Hcl] Other (See Comments)   Reaction:  Migraines and nightmares    Morphine And Related Other (See Comments)   Reaction:  Migraines and nightmares    Other Nausea And Vomiting, Other (See Comments)   Pt states that all -mycins cause N/V.    Sulfa Antibiotics Other (See Comments)    Reaction:  Unknown       Medication List        Accurate as of 03/23/17 11:23 AM. Always use your most recent med list.          ALPRAZolam 0.5 MG tablet Commonly known as:  XANAX Take 2 mg by mouth 2 (two) times daily as needed for anxiety.   atorvastatin 40 MG tablet Commonly known as:  LIPITOR Take 40 mg by mouth at bedtime.   baclofen 10 MG tablet Commonly known as:  LIORESAL Take 20 mg by mouth 3 (three) times daily as needed for muscle spasms.   Biotin 1000 MCG tablet Take 2,000 mcg by mouth 3 (three) times daily.   cephALEXin 500 MG capsule Commonly known as:  KEFLEX Take 1 capsule (500 mg total) by mouth 2 (two) times daily.   cholecalciferol 1000 units tablet Commonly known as:  VITAMIN D Take 2,000 Units by mouth daily.   dicyclomine 10 MG capsule Commonly known as:  BENTYL Take 1 capsule (10 mg total) by mouth every 8 (eight) hours as needed for spasms.   divalproex 250 MG 24 hr tablet Commonly known as:  DEPAKOTE ER Take 500 mg by mouth at bedtime.   donepezil 10 MG tablet Commonly known as:  ARICEPT donepezil 10 mg tablet every evening   gabapentin 100 MG capsule Commonly known as:  NEURONTIN Take 300 mg by mouth 3 (three) times daily.   glucose blood test strip Commonly known as:  CONTOUR NEXT TEST Use to test blood sugar 2 times daily   HUMALOG KWIKPEN 100 UNIT/ML KiwkPen Generic drug:  insulin lispro INJECT 6 UNITS AT BREAKFAST AND LUNCH AND 8 UNITS BEFORE SUPPER   IBU 800 MG tablet Generic drug:  ibuprofen Take 800 mg by mouth 3 (three) times daily as needed for pain.   insulin glargine 100 unit/mL Sopn Commonly known as:  LANTUS Inject 0.34 mLs (34 Units total) into the skin at bedtime.   levothyroxine 75 MCG tablet Commonly known as:  SYNTHROID, LEVOTHROID Take 1 tablet (75 mcg total) by mouth daily before breakfast. I tablet daily before breakfast   lithium carbonate 450 MG CR tablet Commonly known as:  ESKALITH Take 450 mg by  mouth at bedtime.   LORazepam 2 MG tablet Commonly known as:  ATIVAN Take 0.5 mg by mouth every 8 (eight) hours as needed for anxiety. Takes a 0.25  in the morning, then takes 0.5 twice daily   losartan 50 MG tablet Commonly known as:  COZAAR Take 50 mg by mouth daily.   Melatonin 3 MG Tabs Take 9 mg by mouth at bedtime.   memantine 10 MG tablet Commonly known as:  NAMENDA Take 10 mg by mouth every evening.   metFORMIN 500 MG 24 hr tablet Commonly known as:  GLUCOPHAGE-XR Take 1 tablet (500 mg total) daily with breakfast by mouth.   ondansetron 4 MG tablet Commonly known as:  ZOFRAN Take 1 tablet (4 mg total) by mouth every 8 (eight) hours as needed for nausea or vomiting.   ondansetron 8 MG disintegrating tablet Commonly known as:  ZOFRAN ODT Take 1 tablet (8 mg total) by mouth every 8 (eight) hours as needed for nausea or vomiting.   oxyCODONE 5 MG immediate release tablet Commonly known as:  Oxy IR/ROXICODONE Take 1 tablet (5 mg total) by mouth every 6 (six) hours as needed for severe pain.   OZEMPIC 0.25 or 0.5 MG/DOSE Sopn Generic drug:  Semaglutide INJECT 0.5 MG INTO THE SKIN ONCE WEEKLY   phenazopyridine 200 MG tablet Commonly known as:  PYRIDIUM Take 1 tablet (200 mg total) by mouth 3 (three) times daily.   polyethylene glycol packet Commonly known as:  MIRALAX Take 17 g by mouth daily as needed.   QUEtiapine 200 MG tablet Commonly known as:  SEROQUEL Take 100 mg by mouth at 5 pm and 200 mg by mouth at bedtime   rOPINIRole 0.5 MG tablet Commonly known as:  REQUIP Take 1.5 mg by mouth 2 (two) times daily. At 5 PM and 7:30 PM   traMADol 50 MG tablet Commonly known as:  ULTRAM Take 1 tablet (50 mg total) by mouth every 6 (six) hours as needed.   zaleplon 5 MG capsule Commonly known as:  SONATA Take 5 mg by mouth every other day.       Allergies:  Allergies  Allergen Reactions  . Codeine Anxiety and Other (See Comments)    Reaction:   Hallucinations  . Macrolides And Ketolides Nausea And Vomiting  . Procaine Hcl Palpitations  . Aspirin Nausea And Vomiting    Reaction:  Burns pts stomach   . Benadryl [Diphenhydramine]     Per MD "inhibits potency of gabapentin, lithium etc"  . Diflucan [Fluconazole] Other (See Comments)    Reaction:  Unknown   . Dilaudid [Hydromorphone Hcl] Other (See Comments)    Reaction:  Migraines and nightmares   . Morphine And Related Other (See Comments)    Reaction:  Migraines and nightmares   . Other Nausea And Vomiting and Other (See Comments)    Pt states that all -mycins cause N/V.   Marland Kitchen Sulfa Antibiotics Other (See Comments)    Reaction:  Unknown     Past Medical History:  Diagnosis Date  . Alcohol abuse   . Anxiety    takes Valium daily as needed and Ativan daily  . Bilateral hearing loss   . Bipolar 1 disorder ([place])    takes Lithium nightly and Synthroid daily  . Chronic back pain    DDD  . Colitis, ischemic ([place]) 2012  . Confusion    r/t meds  . Depression    takes Prozac daily and Bupspirone   . Diabetes ([place])   . Diabetes mellitus without complication ([place])    takes Janumet daily  . Diverticulosis   . Diverticulosis   . Dyslipidemia    takes Crestor  daily  . Fibromyalgia   . Hepatic steatosis 06/18/12   severe  . History of bronchitis    last time at least 46yrs ago  . Hyperlipidemia   . Hypothyroidism   . IBS (irritable bowel syndrome)   . Ischemic colitis ([place])   . Joint pain   . Joint swelling   . Lupus    tumid-skin  . Nephrolithiasis   . Numbness    in right foot  . Osteoarthritis    in hips  . Osteoarthritis cervical spine   . Osteoarthritis of hand    bilateral  . Restless leg syndrome    takes Requip nightly  . Sciatica   . Urinary frequency   . Urinary leakage   . Urinary urgency   . Urinary, incontinence, stress female   . Walking pneumonia    last time more than 30yrs ago    Past Surgical History:  Procedure Laterality Date  .  APPENDECTOMY    . COLONOSCOPY    . D&C x 4    . DENTAL SURGERY Left 2 weeks ago    dental implant  . ESOPHAGOGASTRODUODENOSCOPY    . excision of tumors at right neck     angle of jaw '68, benign  . FLEXIBLE SIGMOIDOSCOPY N/A 06/21/2012   Procedure: FLEXIBLE SIGMOIDOSCOPY;  Surgeon: Jerene Bears, MD;  Location: [facility];  Service: Gastroenterology;  Laterality: N/A;  . PARTIAL HYSTERECTOMY    . TONSILLECTOMY    . TOTAL HIP ARTHROPLASTY Right 06/09/2013   Procedure: TOTAL HIP ARTHROPLASTY;  Surgeon: Kerin Salen, MD;  Location: [facility];  Service: Orthopedics;  Laterality: Right;  . TUBAL LIGATION      Family History  Problem Relation Age of Onset  . Drug abuse Mother   . Alcohol abuse Mother   . Alcohol abuse Father   . Alcohol abuse Brother        x 2    Social History:  reports that  has never smoked. she has never used smokeless tobacco. She reports that she does not drink alcohol or use drugs.   Review of Systems    Lipid history: On Lipitor 40 mg Prescribed by her PCP Last LDL under 75  Triglycerides  high but better than before, previously over 400   Lab Results  Component Value Date   CHOL 146 12/17/2016   HDL 35.70 (L) 12/17/2016   LDLDIRECT 74.0 12/17/2016   TRIG 290.0 (H) 12/17/2016   CHOLHDL 4 12/17/2016            Most recent eye exam was In 6/18  Most recent foot exam:03/2016  THYROID: She has been on levothyroxine and Cytomel from her psychiatrist for several years with uncertain diagnosis Currently taking levothyroxine 75 g Has also been on Cytomel 25 g, again recent free T3 was normal Also on lithium  Labs as follows:  Lab Results  Component Value Date   TSH 3.86 03/22/2017   TSH 0.05 (L) 07/10/2016   TSH 0.26 (L) 04/30/2016   FREET4 1.03 03/22/2017   FREET4 0.65 07/10/2016   FREET4 0.56 (L) 04/30/2016   Lab Results  Component Value Date   T3FREE 3.0 03/22/2017   T3FREE 3.2 07/10/2016   T3FREE 2.7 04/30/2016    She has atypical  depression on multiple drugs  Recently having problems with diarrhea and is taking antibiotics is eating  She is complaining of feeling weak the last couple of days or so, sometimes lightheaded Her PCP told her that  she is dehydrated   LABS:  Lab on 03/22/2017  Component Date Value Ref Range Status  . T3, Free 03/22/2017 3.0  2.3 - 4.2 pg/mL Final  . Free T4 03/22/2017 1.03  0.60 - 1.60 ng/dL Final   Comment: Specimens from patients who are undergoing biotin therapy and /or ingesting biotin supplements may contain high levels of biotin.  The higher biotin concentration in these specimens interferes with this Free T4 assay.  Specimens that contain high levels  of biotin may cause false high results for this Free T4 assay.  Please interpret results in light of the total clinical presentation of the patient.    Marland Kitchen TSH 03/22/2017 3.86  0.35 - 4.50 uIU/mL Final  . Sodium 03/22/2017 138  135 - 145 mEq/L Final  . Potassium 03/22/2017 4.4  3.5 - 5.1 mEq/L Final  . Chloride 03/22/2017 103  96 - 112 mEq/L Final  . CO2 03/22/2017 27  19 - 32 mEq/L Final  . Glucose, Bld 03/22/2017 167* 70 - 99 mg/dL Final  . BUN 03/22/2017 7  6 - 23 mg/dL Final  . Creatinine, Ser 03/22/2017 0.88  0.40 - 1.20 mg/dL Final  . Total Bilirubin 03/22/2017 0.4  0.2 - 1.2 mg/dL Final  . Alkaline Phosphatase 03/22/2017 78  39 - 117 U/L Final  . AST 03/22/2017 14  0 - 37 U/L Final  . ALT 03/22/2017 12  0 - 35 U/L Final  . Total Protein 03/22/2017 6.5  6.0 - 8.3 g/dL Final  . Albumin 03/22/2017 4.0  3.5 - 5.2 g/dL Final  . Calcium 03/22/2017 9.7  8.4 - 10.5 mg/dL Final  . GFR 03/22/2017 67.68  >60.00 mL/min Final  . Hgb A1c MFr Bld 03/22/2017 6.5  4.6 - 6.5 % Final   Glycemic Control Guidelines for People with Diabetes:Non Diabetic:  <6%Goal of Therapy: <7%Additional Action Suggested:  >8%     Physical Examination:  BP 124/70 (Patient Position: Standing)   Pulse 84   Ht 5\' 2"  (1.575 m)   Wt 167 lb (75.8 kg)   SpO2  96%   BMI 30.54 kg/m          ASSESSMENT:  Diabetes type 2, uncontrolled     See history of present illness for detailed discussion of current diabetes management, blood sugar patterns and problems identified  She is on Ozempic, basal bolus insulin and Low-dose metformin   Although she has not done consistently well on her diet especially in December her A1c is not any higher, still 6.5 However she has not checked readings after meals and this was discussed She is asking about possible diarrhea from [place] but she has been taking these drugs for quite some time without side effects and has no nausea Fasting readings are not consistent but recently improved with current dose of Lantus   WEAKNESS: She is not orthostatic today, however since she has had problems with diarrhea may need to reduce her losartan to half tablet for the next 3 days at least No evidence of prerenal azotemia on Labs yesterday  ?  Hypothyroidism, possibly secondary: Her labs are in the normal range with current regimen although TSH relatively higher, will need to continue following  Anemia: She will discuss this with her PCP  PLAN:   Continue Ozempic 0.5 mg weekly   Stop metformin until diarrhea better  More readings after meals especially evening meal  Restart exercise when able to  Discussed needing to adjust her mealtime doses if her postprandial  readings are consistently high  Continue to improve diet She will call if she has consistently high readings in the morning over 140  Patient Instructions  Stop metformin  MUST CHECK sugars after supper  Check blood sugars on waking up  3x weekly  Also check blood sugars about 2 hours after a meal and do this after different meals by rotation  Recommended blood sugar levels on waking up is 90-130 and about 2 hours after meal is 130-160  Please bring your blood sugar monitor to each visit, thank you  Cut Losartan in 1/2 for a few  days    Counseling time on subjects discussed in assessment and plan sections is over 50% of today's 25 minute visit     Elayne Snare 03/23/2017, 11:23 AM   Note: This office note was prepared with Dragon voice recognition system technology. Any transcriptional errors that result from this process are unintentional.

## 2017-04-06 DIAGNOSIS — R350 Frequency of micturition: Secondary | ICD-10-CM | POA: Diagnosis not present

## 2017-04-06 DIAGNOSIS — F319 Bipolar disorder, unspecified: Secondary | ICD-10-CM | POA: Diagnosis not present

## 2017-04-20 DIAGNOSIS — R531 Weakness: Secondary | ICD-10-CM | POA: Diagnosis not present

## 2017-04-20 DIAGNOSIS — R739 Hyperglycemia, unspecified: Secondary | ICD-10-CM | POA: Diagnosis not present

## 2017-04-29 DIAGNOSIS — F319 Bipolar disorder, unspecified: Secondary | ICD-10-CM | POA: Diagnosis not present

## 2017-05-21 DIAGNOSIS — F319 Bipolar disorder, unspecified: Secondary | ICD-10-CM | POA: Diagnosis not present

## 2017-05-23 ENCOUNTER — Other Ambulatory Visit: Payer: Self-pay | Admitting: Endocrinology

## 2017-06-01 DIAGNOSIS — M545 Low back pain: Secondary | ICD-10-CM | POA: Diagnosis not present

## 2017-06-14 DIAGNOSIS — M546 Pain in thoracic spine: Secondary | ICD-10-CM | POA: Diagnosis not present

## 2017-06-14 DIAGNOSIS — M4004 Postural kyphosis, thoracic region: Secondary | ICD-10-CM | POA: Diagnosis not present

## 2017-06-15 DIAGNOSIS — Z79899 Other long term (current) drug therapy: Secondary | ICD-10-CM | POA: Diagnosis not present

## 2017-06-15 DIAGNOSIS — R5382 Chronic fatigue, unspecified: Secondary | ICD-10-CM | POA: Diagnosis not present

## 2017-06-15 DIAGNOSIS — F319 Bipolar disorder, unspecified: Secondary | ICD-10-CM | POA: Diagnosis not present

## 2017-06-16 ENCOUNTER — Other Ambulatory Visit: Payer: Self-pay | Admitting: Endocrinology

## 2017-06-17 ENCOUNTER — Other Ambulatory Visit: Payer: Medicare Other

## 2017-06-18 ENCOUNTER — Other Ambulatory Visit (INDEPENDENT_AMBULATORY_CARE_PROVIDER_SITE_OTHER): Payer: Medicare Other

## 2017-06-18 DIAGNOSIS — E1165 Type 2 diabetes mellitus with hyperglycemia: Secondary | ICD-10-CM | POA: Diagnosis not present

## 2017-06-18 DIAGNOSIS — Z794 Long term (current) use of insulin: Secondary | ICD-10-CM

## 2017-06-18 LAB — BASIC METABOLIC PANEL
BUN: 9 mg/dL (ref 6–23)
CO2: 29 mEq/L (ref 19–32)
Calcium: 9.9 mg/dL (ref 8.4–10.5)
Chloride: 104 mEq/L (ref 96–112)
Creatinine, Ser: 0.91 mg/dL (ref 0.40–1.20)
GFR: 65.07 mL/min (ref >60.00)
Glucose, Bld: 110 mg/dL — ABNORMAL HIGH (ref 70–99)
Potassium: 4.2 mEq/L (ref 3.5–5.1)
Sodium: 140 mEq/L (ref 135–145)

## 2017-06-18 LAB — LDL CHOLESTEROL, DIRECT: Direct LDL: 76 mg/dL

## 2017-06-18 LAB — LIPID PANEL
Cholesterol: 154 mg/dL (ref 0–200)
HDL: 54.8 mg/dL (ref >39.00)
NonHDL: 98.86
Total CHOL/HDL Ratio: 3
Triglycerides: 294 mg/dL — ABNORMAL HIGH (ref 0.0–149.0)
VLDL: 58.8 mg/dL — ABNORMAL HIGH (ref 0.0–40.0)

## 2017-06-18 LAB — HEMOGLOBIN A1C: Hgb A1c MFr Bld: 6.6 % — ABNORMAL HIGH (ref 4.6–6.5)

## 2017-06-21 ENCOUNTER — Encounter: Payer: Self-pay | Admitting: Endocrinology

## 2017-06-21 ENCOUNTER — Ambulatory Visit (INDEPENDENT_AMBULATORY_CARE_PROVIDER_SITE_OTHER): Payer: Medicare Other | Admitting: Endocrinology

## 2017-06-21 VITALS — BP 126/84 | HR 74 | Ht 62.0 in | Wt 175.2 lb

## 2017-06-21 DIAGNOSIS — E1165 Type 2 diabetes mellitus with hyperglycemia: Secondary | ICD-10-CM | POA: Diagnosis not present

## 2017-06-21 DIAGNOSIS — E782 Mixed hyperlipidemia: Secondary | ICD-10-CM

## 2017-06-21 DIAGNOSIS — Z794 Long term (current) use of insulin: Secondary | ICD-10-CM

## 2017-06-21 NOTE — Patient Instructions (Addendum)
Check blood sugars on waking up  3/7  Also check blood sugars about 2 hours after a meal and do this after different meals by rotation  Recommended blood sugar levels on waking up is 90-130 and about 2 hours after meal is 130-180  Please bring your blood sugar monitor to each visit, thank you  Walk 20-30 min daily

## 2017-06-21 NOTE — Progress Notes (Signed)
Patient ID: [name] [name], female   DOB: March 26, 1945, 72 y.o.   MRN: [id number]            Reason for Appointment: Follow-up for Type 2 Diabetes   History of Present Illness:          Date of diagnosis of type 2 diabetes mellitus: ?  2014        Background history:   She thinks her blood sugar was 400 at the time of diagnosis but no detailed records of this are available She did have an A1c of 10.6 done in 2014 and was probably given Lantus for some time initially Apparently she was treated with various medications including metformin, Janumet and Tradgenta. Her blood sugars had improved and A1c in 2015 was down to 6.2 She tends to have diarrhea with metformin and Janumet and also she thinks it causes dry mouth Most likely Janumet was stopped in 06/2015 Glipizide was started in 8/17 when blood sugars were higher and A1c was 9%  Recent history:   INSULIN regimen is:   Lantus 34 units daily at 5 pm  Humalog 6 before breakfast--8  before dinner 0 hs  Non-insulin hypoglycemic drugs the patient is taking are: Ozempic 0.5 mg weekly , not on metformin ER 500 mg daily   Her A1c previously was consistently over 8%, now again below 7% at 6.6  Current management, blood sugar patterns and problems identified:  She says in March she was not able to follow her diet and was eating during the night when she was not able to sleep; this caused her blood sugars to be as high as 254  As before she has not done readings after meals except rarely  She will usually have a snack at bedtime which may be leftovers from dinnertime but not take any insulin at that time  Her fasting readings are mostly high and have been recently in the 128-155 range  She is not doing any formal exercise she is a little more active with going shopping and home decorating  Metformin was stopped previously because of tendency to diarrhea and she was only taking 1 tablet daily   Side effects from medications have been:?   Diarrhea from metformin and Janumet  Compliance with the medical regimen: Inconsistent  Glucose monitoring:  done 1 time a day or less        Glucometer: Contour        Blood Glucose readings by time of day and averages from monitor download  Mean values apply above for all meters except median for One Touch  PRE-MEAL Fasting Lunch Dinner Bedtime Overall  Glucose range: 124-193      Mean/median:  150       POST-MEAL PC Breakfast PC Lunch PC Dinner  Glucose range:   121, 137  111-254  Mean/median:      Self-care: The diet that the patient has been following [id number]:SWFU, tries to limit drinks with sugar .      Typical meal intake: Breakfast at 9,  May be eggs and sausage and lunch is usually a sandwich   Dinner 6 pm               Dietician visit, most recent: 10/18               Exercise:  Only trying to do some shopping  Weight history:  Wt Readings from Last 3 Encounters:  06/21/17 175 lb 3.2 oz (79.5 kg)  03/23/17 167 lb (  75.8 kg)  12/29/16 169 lb (76.7 kg)    Glycemic control:   Lab Results  Component Value Date   HGBA1C 6.6 (H) 06/18/2017   HGBA1C 6.5 03/22/2017   HGBA1C 6.3 12/17/2016   Lab Results  Component Value Date   MICROALBUR <0.7 07/10/2016   CREATININE 0.91 06/18/2017   Lab Results  Component Value Date   MICRALBCREAT 0.5 07/10/2016       Allergies as of 06/21/2017      Reactions   Codeine Anxiety, Other (See Comments)   Reaction:  Hallucinations   Macrolides And Ketolides Nausea And Vomiting   Procaine Hcl Palpitations   Aspirin Nausea And Vomiting   Reaction:  Burns pts stomach    Benadryl [diphenhydramine]    Per MD "inhibits potency of gabapentin, lithium etc"   Diflucan [fluconazole] Other (See Comments)   Reaction:  Unknown    Dilaudid [hydromorphone Hcl] Other (See Comments)   Reaction:  Migraines and nightmares    Morphine And Related Other (See Comments)   Reaction:  Migraines and nightmares    Other Nausea And Vomiting, Other  (See Comments)   Pt states that all -mycins cause N/V.    Sulfa Antibiotics Other (See Comments)   Reaction:  Unknown       Medication List        Accurate as of 06/21/17  1:56 PM. Always use your most recent med list.          ALPRAZolam 0.5 MG tablet Commonly known as:  XANAX Take 2 mg by mouth 2 (two) times daily as needed for anxiety.   atorvastatin 40 MG tablet Commonly known as:  LIPITOR Take 40 mg by mouth at bedtime.   baclofen 10 MG tablet Commonly known as:  LIORESAL Take 20 mg by mouth 3 (three) times daily as needed for muscle spasms.   BD PEN NEEDLE NANO U/F 32G X 4 MM Misc Generic drug:  Insulin Pen Needle INJECT FIVE TIMES DAILY AS DIRECTED   Biotin 1000 MCG tablet Take 2,000 mcg by mouth 3 (three) times daily.   cephALEXin 500 MG capsule Commonly known as:  KEFLEX Take 1 capsule (500 mg total) by mouth 2 (two) times daily.   cholecalciferol 1000 units tablet Commonly known as:  VITAMIN D Take 2,000 Units by mouth daily.   dicyclomine 10 MG capsule Commonly known as:  BENTYL Take 1 capsule (10 mg total) by mouth every 8 (eight) hours as needed for spasms.   divalproex 250 MG 24 hr tablet Commonly known as:  DEPAKOTE ER Take 500 mg by mouth at bedtime.   donepezil 10 MG tablet Commonly known as:  ARICEPT donepezil 10 mg tablet every evening   gabapentin 100 MG capsule Commonly known as:  NEURONTIN Take 300 mg by mouth 3 (three) times daily.   glucose blood test strip Commonly known as:  CONTOUR NEXT TEST Use to test blood sugar 2 times daily   HUMALOG KWIKPEN 100 UNIT/ML KiwkPen Generic drug:  insulin lispro INJECT 6 UNITS AT BREAKFAST AND LUNCH AND 8 UNITS BEFORE SUPPER   IBU 800 MG tablet Generic drug:  ibuprofen Take 800 mg by mouth 3 (three) times daily as needed for pain.   insulin glargine 100 unit/mL Sopn Commonly known as:  LANTUS Inject 0.34 mLs (34 Units total) into the skin at bedtime.   levothyroxine 75 MCG  tablet Commonly known as:  SYNTHROID, LEVOTHROID Take 1 tablet (75 mcg total) by mouth daily before breakfast. I tablet  daily before breakfast   lithium carbonate 450 MG CR tablet Commonly known as:  ESKALITH Take 450 mg by mouth at bedtime.   LORazepam 2 MG tablet Commonly known as:  ATIVAN Take 0.5 mg by mouth every 8 (eight) hours as needed for anxiety. Takes a 0.25 in the morning, then takes 0.5 twice daily   losartan 50 MG tablet Commonly known as:  COZAAR Take 50 mg by mouth daily.   Melatonin 3 MG Tabs Take 9 mg by mouth at bedtime.   memantine 10 MG tablet Commonly known as:  NAMENDA Take 10 mg by mouth every evening.   ondansetron 4 MG tablet Commonly known as:  ZOFRAN Take 1 tablet (4 mg total) by mouth every 8 (eight) hours as needed for nausea or vomiting.   ondansetron 8 MG disintegrating tablet Commonly known as:  ZOFRAN ODT Take 1 tablet (8 mg total) by mouth every 8 (eight) hours as needed for nausea or vomiting.   oxyCODONE 5 MG immediate release tablet Commonly known as:  Oxy IR/ROXICODONE Take 1 tablet (5 mg total) by mouth every 6 (six) hours as needed for severe pain.   OZEMPIC 0.25 or 0.5 MG/DOSE Sopn Generic drug:  Semaglutide INJECT 0.5 MG INTO THE SKIN ONCE WEEKLY   phenazopyridine 200 MG tablet Commonly known as:  PYRIDIUM Take 1 tablet (200 mg total) by mouth 3 (three) times daily.   polyethylene glycol packet Commonly known as:  MIRALAX Take 17 g by mouth daily as needed.   QUEtiapine 200 MG tablet Commonly known as:  SEROQUEL Take 100 mg by mouth at 5 pm and 200 mg by mouth at bedtime   rOPINIRole 0.5 MG tablet Commonly known as:  REQUIP Take 1.5 mg by mouth 2 (two) times daily. At 5 PM and 7:30 PM   traMADol 50 MG tablet Commonly known as:  ULTRAM Take 1 tablet (50 mg total) by mouth every 6 (six) hours as needed.   zaleplon 5 MG capsule Commonly known as:  SONATA Take 5 mg by mouth every other day.       Allergies:   Allergies  Allergen Reactions  . Codeine Anxiety and Other (See Comments)    Reaction:  Hallucinations  . Macrolides And Ketolides Nausea And Vomiting  . Procaine Hcl Palpitations  . Aspirin Nausea And Vomiting    Reaction:  Burns pts stomach   . Benadryl [Diphenhydramine]     Per MD "inhibits potency of gabapentin, lithium etc"  . Diflucan [Fluconazole] Other (See Comments)    Reaction:  Unknown   . Dilaudid [Hydromorphone Hcl] Other (See Comments)    Reaction:  Migraines and nightmares   . Morphine And Related Other (See Comments)    Reaction:  Migraines and nightmares   . Other Nausea And Vomiting and Other (See Comments)    Pt states that all -mycins cause N/V.   Marland Kitchen Sulfa Antibiotics Other (See Comments)    Reaction:  Unknown     Past Medical History:  Diagnosis Date  . Alcohol abuse   . Anxiety    takes Valium daily as needed and Ativan daily  . Bilateral hearing loss   . Bipolar 1 disorder ([place])    takes Lithium nightly and Synthroid daily  . Chronic back pain    DDD  . Colitis, ischemic ([place]) 2012  . Confusion    r/t meds  . Depression    takes Prozac daily and Bupspirone   . Diabetes ([place])   . Diabetes  mellitus without complication ([place])    takes Janumet daily  . Diverticulosis   . Diverticulosis   . Dyslipidemia    takes Crestor daily  . Fibromyalgia   . Hepatic steatosis 06/18/12   severe  . History of bronchitis    last time at least 85yrs ago  . Hyperlipidemia   . Hypothyroidism   . IBS (irritable bowel syndrome)   . Ischemic colitis ([place])   . Joint pain   . Joint swelling   . Lupus ([place])    tumid-skin  . Nephrolithiasis   . Numbness    in right foot  . Osteoarthritis    in hips  . Osteoarthritis cervical spine   . Osteoarthritis of hand    bilateral  . Restless leg syndrome    takes Requip nightly  . Sciatica   . Urinary frequency   . Urinary leakage   . Urinary urgency   . Urinary, incontinence, stress female   . Walking pneumonia     last time more than 42yrs ago    Past Surgical History:  Procedure Laterality Date  . APPENDECTOMY    . COLONOSCOPY    . D&C x 4    . DENTAL SURGERY Left 2 weeks ago    dental implant  . ESOPHAGOGASTRODUODENOSCOPY    . excision of tumors at right neck     angle of jaw '68, benign  . FLEXIBLE SIGMOIDOSCOPY N/A 06/21/2012   Procedure: FLEXIBLE SIGMOIDOSCOPY;  Surgeon: Jerene Bears, MD;  Location: [facility];  Service: Gastroenterology;  Laterality: N/A;  . PARTIAL HYSTERECTOMY    . TONSILLECTOMY    . TOTAL HIP ARTHROPLASTY Right 06/09/2013   Procedure: TOTAL HIP ARTHROPLASTY;  Surgeon: Kerin Salen, MD;  Location: [facility];  Service: Orthopedics;  Laterality: Right;  . TUBAL LIGATION      Family History  Problem Relation Age of Onset  . Drug abuse Mother   . Alcohol abuse Mother   . Alcohol abuse Father   . Alcohol abuse Brother        x 2    Social History:  reports that she has never smoked. She has never used smokeless tobacco. She reports that she does not drink alcohol or use drugs.   Review of Systems    Lipid history: On Lipitor 40 mg Prescribed by her PCP Last LDL under 75  Triglycerides  high but not fasting   Lab Results  Component Value Date   CHOL 154 06/18/2017   HDL 54.80 06/18/2017   LDLDIRECT 76.0 06/18/2017   TRIG 294.0 (H) 06/18/2017   CHOLHDL 3 06/18/2017            Most recent eye exam was In 6/18  Most recent foot exam:03/2016  THYROID: She has been on levothyroxine and Cytomel from her psychiatrist for several years with uncertain diagnosis Currently taking levothyroxine 75 g Has also been on Cytomel 25 g, last free T3 was normal Also on lithium  Labs as follows:  Lab Results  Component Value Date   TSH 3.86 03/22/2017   TSH 0.05 (L) 07/10/2016   TSH 0.26 (L) 04/30/2016   FREET4 1.03 03/22/2017   FREET4 0.65 07/10/2016   FREET4 0.56 (L) 04/30/2016   Lab Results  Component Value Date   T3FREE 3.0 03/22/2017   T3FREE 3.2  07/10/2016   T3FREE 2.7 04/30/2016    She has atypical depression on multiple drugs     LABS:  Lab on 06/18/2017  Component Date  Value Ref Range Status  . Cholesterol 06/18/2017 154  0 - 200 mg/dL Final   ATP III Classification       Desirable:  < 200 mg/dL               Borderline High:  200 - 239 mg/dL          High:  > = 240 mg/dL  . Triglycerides 06/18/2017 294.0* 0.0 - 149.0 mg/dL Final   Normal:  <150 mg/dLBorderline High:  150 - 199 mg/dL  . HDL 06/18/2017 54.80  >39.00 mg/dL Final  . VLDL 06/18/2017 58.8* 0.0 - 40.0 mg/dL Final  . Total CHOL/HDL Ratio 06/18/2017 3   Final                  Men          Women1/2 Average Risk     3.4          3.3Average Risk          5.0          4.42X Average Risk          9.6          7.13X Average Risk          15.0          11.0                      . NonHDL 06/18/2017 98.86   Final   NOTE:  Non-HDL goal should be 30 mg/dL higher than patient's LDL goal (i.e. LDL goal of < 70 mg/dL, would have non-HDL goal of < 100 mg/dL)  . Sodium 06/18/2017 140  135 - 145 mEq/L Final  . Potassium 06/18/2017 4.2  3.5 - 5.1 mEq/L Final  . Chloride 06/18/2017 104  96 - 112 mEq/L Final  . CO2 06/18/2017 29  19 - 32 mEq/L Final  . Glucose, Bld 06/18/2017 110* 70 - 99 mg/dL Final  . BUN 06/18/2017 9  6 - 23 mg/dL Final  . Creatinine, Ser 06/18/2017 0.91  0.40 - 1.20 mg/dL Final  . Calcium 06/18/2017 9.9  8.4 - 10.5 mg/dL Final  . GFR 06/18/2017 65.07  >60.00 mL/min Final  . Hgb A1c MFr Bld 06/18/2017 6.6* 4.6 - 6.5 % Final   Glycemic Control Guidelines for People with Diabetes:Non Diabetic:  <6%Goal of Therapy: <7%Additional Action Suggested:  >8%   . Direct LDL 06/18/2017 76.0  mg/dL Final   Optimal:  <100 mg/dLNear or Above Optimal:  100-129 mg/dLBorderline High:  130-159 mg/dLHigh:  160-189 mg/dLVery High:  >190 mg/dL    Physical Examination:  BP 126/84 (BP Location: Left Arm, Patient Position: Sitting, Cuff Size: Normal)   Pulse 74   Ht 5\' 2"  (1.575  m)   Wt 175 lb 3.2 oz (79.5 kg)   SpO2 94%   BMI 32.04 kg/m          ASSESSMENT:  Diabetes type 2, BMI now 32  See history of present illness for detailed discussion of current diabetes management, blood sugar patterns and problems identified  She is on Ozempic, basal bolus insulin   Her A1c is still about the same although she did have high readings last month when she was not able to be consistent with her diet Has gained weight  More recently her fasting readings are relatively good although not consistent Appears to be getting adequate amounts of basal insulin  Most likely she needs to adjust  her mealtime dose based on her blood sugars and meal size She does also need to keep a check on the postprandial readings better than she is currently Also benefiting from [place] and needing relatively small doses of Humalog She is not taking her Humalog at bedtime for relatively larger carbohydrate snacks lately   Hypothyroidism, possibly secondary: Will need follow-up labs in the next visit   PLAN:   There are no Patient Instructions on file for this visit.        Elayne Snare 06/21/2017, 1:56 PM   Note: This office note was prepared with Dragon voice recognition system technology. Any transcriptional errors that result from this process are unintentional.

## 2017-06-22 DIAGNOSIS — F319 Bipolar disorder, unspecified: Secondary | ICD-10-CM | POA: Diagnosis not present

## 2017-06-30 DIAGNOSIS — M546 Pain in thoracic spine: Secondary | ICD-10-CM | POA: Diagnosis not present

## 2017-06-30 DIAGNOSIS — M4004 Postural kyphosis, thoracic region: Secondary | ICD-10-CM | POA: Diagnosis not present

## 2017-07-13 DIAGNOSIS — R0602 Shortness of breath: Secondary | ICD-10-CM | POA: Diagnosis not present

## 2017-07-13 DIAGNOSIS — R5383 Other fatigue: Secondary | ICD-10-CM | POA: Diagnosis not present

## 2017-07-13 DIAGNOSIS — M791 Myalgia, unspecified site: Secondary | ICD-10-CM | POA: Diagnosis not present

## 2017-07-13 DIAGNOSIS — R531 Weakness: Secondary | ICD-10-CM | POA: Diagnosis not present

## 2017-07-21 DIAGNOSIS — M791 Myalgia, unspecified site: Secondary | ICD-10-CM | POA: Diagnosis not present

## 2017-07-21 DIAGNOSIS — M2550 Pain in unspecified joint: Secondary | ICD-10-CM | POA: Diagnosis not present

## 2017-07-21 DIAGNOSIS — F319 Bipolar disorder, unspecified: Secondary | ICD-10-CM | POA: Diagnosis not present

## 2017-07-21 DIAGNOSIS — E119 Type 2 diabetes mellitus without complications: Secondary | ICD-10-CM | POA: Diagnosis not present

## 2017-07-21 DIAGNOSIS — M79606 Pain in leg, unspecified: Secondary | ICD-10-CM | POA: Diagnosis not present

## 2017-07-23 DIAGNOSIS — F319 Bipolar disorder, unspecified: Secondary | ICD-10-CM | POA: Diagnosis not present

## 2017-07-24 ENCOUNTER — Other Ambulatory Visit: Payer: Self-pay | Admitting: Endocrinology

## 2017-07-29 ENCOUNTER — Other Ambulatory Visit: Payer: Self-pay | Admitting: *Deleted

## 2017-08-03 DIAGNOSIS — Z780 Asymptomatic menopausal state: Secondary | ICD-10-CM | POA: Diagnosis not present

## 2017-08-18 ENCOUNTER — Telehealth: Payer: Self-pay | Admitting: Endocrinology

## 2017-08-18 ENCOUNTER — Other Ambulatory Visit: Payer: Self-pay

## 2017-08-18 MED ORDER — LEVOTHYROXINE SODIUM 75 MCG PO TABS: 75.0000 ug | ORAL_TABLET | Freq: Every day | ORAL | 30 refills | Status: DC

## 2017-08-18 NOTE — Telephone Encounter (Signed)
Medication sent to pharmacy  

## 2017-08-18 NOTE — Telephone Encounter (Signed)
levothyroxine (SYNTHROID, LEVOTHROID) 75 MCG tablet   Patient needs a refill sent into the pharmacy   [facility], [address] AT [address]

## 2017-08-26 DIAGNOSIS — N390 Urinary tract infection, site not specified: Secondary | ICD-10-CM | POA: Diagnosis not present

## 2017-08-26 DIAGNOSIS — J329 Chronic sinusitis, unspecified: Secondary | ICD-10-CM | POA: Diagnosis not present

## 2017-08-27 DIAGNOSIS — F319 Bipolar disorder, unspecified: Secondary | ICD-10-CM | POA: Diagnosis not present

## 2017-09-09 DIAGNOSIS — N390 Urinary tract infection, site not specified: Secondary | ICD-10-CM | POA: Diagnosis not present

## 2017-09-10 DIAGNOSIS — Z79899 Other long term (current) drug therapy: Secondary | ICD-10-CM | POA: Diagnosis not present

## 2017-09-11 DIAGNOSIS — R109 Unspecified abdominal pain: Secondary | ICD-10-CM | POA: Diagnosis not present

## 2017-09-11 DIAGNOSIS — K5792 Diverticulitis of intestine, part unspecified, without perforation or abscess without bleeding: Secondary | ICD-10-CM | POA: Diagnosis not present

## 2017-09-13 ENCOUNTER — Telehealth: Payer: Self-pay | Admitting: Emergency Medicine

## 2017-09-13 ENCOUNTER — Other Ambulatory Visit: Payer: Self-pay

## 2017-09-13 MED ORDER — LANCETS 30G MISC: 1.0000 | Freq: Every day | 3 refills | Status: DC

## 2017-09-13 NOTE — Telephone Encounter (Signed)
Pt called and wants to know if she can get a refill on her lancets. Pharmacy stated they didn't have a prescription to refill them. Pharmacy is [facility]. Thanks.

## 2017-09-13 NOTE — Telephone Encounter (Signed)
Rx sent 

## 2017-09-14 ENCOUNTER — Encounter (HOSPITAL_COMMUNITY)
Admission: EM | Disposition: A | Discharge status: Home / Self Care | Payer: Self-pay | Source: Home / Self Care | Attending: Cardiovascular Disease

## 2017-09-14 ENCOUNTER — Inpatient Hospital Stay (HOSPITAL_COMMUNITY)
Admission: EM | Admit: 2017-09-14 | Discharge: 2017-09-14 | DRG: 287 | Disposition: A | Discharge status: Home / Self Care | Payer: Medicare Other | Attending: Cardiovascular Disease | Admitting: Cardiovascular Disease

## 2017-09-14 ENCOUNTER — Encounter (HOSPITAL_COMMUNITY): Payer: Self-pay

## 2017-09-14 ENCOUNTER — Other Ambulatory Visit: Payer: Self-pay

## 2017-09-14 ENCOUNTER — Emergency Department (HOSPITAL_COMMUNITY): Payer: Medicare Other

## 2017-09-14 DIAGNOSIS — Z885 Allergy status to narcotic agent status: Secondary | ICD-10-CM

## 2017-09-14 DIAGNOSIS — K760 Fatty (change of) liver, not elsewhere classified: Secondary | ICD-10-CM | POA: Diagnosis present

## 2017-09-14 DIAGNOSIS — Z882 Allergy status to sulfonamides status: Secondary | ICD-10-CM | POA: Diagnosis not present

## 2017-09-14 DIAGNOSIS — Z884 Allergy status to anesthetic agent status: Secondary | ICD-10-CM | POA: Diagnosis not present

## 2017-09-14 DIAGNOSIS — I2510 Atherosclerotic heart disease of native coronary artery without angina pectoris: Secondary | ICD-10-CM

## 2017-09-14 DIAGNOSIS — G2581 Restless legs syndrome: Secondary | ICD-10-CM | POA: Diagnosis present

## 2017-09-14 DIAGNOSIS — E785 Hyperlipidemia, unspecified: Secondary | ICD-10-CM | POA: Diagnosis present

## 2017-09-14 DIAGNOSIS — E119 Type 2 diabetes mellitus without complications: Secondary | ICD-10-CM | POA: Diagnosis present

## 2017-09-14 DIAGNOSIS — Z79899 Other long term (current) drug therapy: Secondary | ICD-10-CM

## 2017-09-14 DIAGNOSIS — Z8249 Family history of ischemic heart disease and other diseases of the circulatory system: Secondary | ICD-10-CM

## 2017-09-14 DIAGNOSIS — M797 Fibromyalgia: Secondary | ICD-10-CM | POA: Diagnosis present

## 2017-09-14 DIAGNOSIS — Z886 Allergy status to analgesic agent status: Secondary | ICD-10-CM

## 2017-09-14 DIAGNOSIS — Z881 Allergy status to other antibiotic agents status: Secondary | ICD-10-CM

## 2017-09-14 DIAGNOSIS — Z794 Long term (current) use of insulin: Secondary | ICD-10-CM | POA: Diagnosis not present

## 2017-09-14 DIAGNOSIS — Z87442 Personal history of urinary calculi: Secondary | ICD-10-CM | POA: Diagnosis not present

## 2017-09-14 DIAGNOSIS — F329 Major depressive disorder, single episode, unspecified: Secondary | ICD-10-CM | POA: Diagnosis present

## 2017-09-14 DIAGNOSIS — M1990 Unspecified osteoarthritis, unspecified site: Secondary | ICD-10-CM | POA: Diagnosis present

## 2017-09-14 DIAGNOSIS — E039 Hypothyroidism, unspecified: Secondary | ICD-10-CM | POA: Diagnosis present

## 2017-09-14 DIAGNOSIS — M479 Spondylosis, unspecified: Secondary | ICD-10-CM | POA: Diagnosis present

## 2017-09-14 DIAGNOSIS — Z888 Allergy status to other drugs, medicaments and biological substances status: Secondary | ICD-10-CM

## 2017-09-14 DIAGNOSIS — R0789 Other chest pain: Secondary | ICD-10-CM | POA: Diagnosis not present

## 2017-09-14 DIAGNOSIS — Z96641 Presence of right artificial hip joint: Secondary | ICD-10-CM | POA: Diagnosis present

## 2017-09-14 DIAGNOSIS — I25110 Atherosclerotic heart disease of native coronary artery with unstable angina pectoris: Secondary | ICD-10-CM | POA: Diagnosis present

## 2017-09-14 DIAGNOSIS — Z79890 Hormone replacement therapy (postmenopausal): Secondary | ICD-10-CM

## 2017-09-14 DIAGNOSIS — Z90711 Acquired absence of uterus with remaining cervical stump: Secondary | ICD-10-CM | POA: Diagnosis not present

## 2017-09-14 DIAGNOSIS — F319 Bipolar disorder, unspecified: Secondary | ICD-10-CM | POA: Diagnosis present

## 2017-09-14 DIAGNOSIS — R079 Chest pain, unspecified: Secondary | ICD-10-CM | POA: Diagnosis not present

## 2017-09-14 DIAGNOSIS — I10 Essential (primary) hypertension: Secondary | ICD-10-CM | POA: Diagnosis present

## 2017-09-14 DIAGNOSIS — Z883 Allergy status to other anti-infective agents status: Secondary | ICD-10-CM

## 2017-09-14 DIAGNOSIS — M19042 Primary osteoarthritis, left hand: Secondary | ICD-10-CM | POA: Diagnosis present

## 2017-09-14 DIAGNOSIS — I2119 ST elevation (STEMI) myocardial infarction involving other coronary artery of inferior wall: Secondary | ICD-10-CM | POA: Diagnosis not present

## 2017-09-14 DIAGNOSIS — K589 Irritable bowel syndrome without diarrhea: Secondary | ICD-10-CM | POA: Diagnosis present

## 2017-09-14 DIAGNOSIS — K5730 Diverticulosis of large intestine without perforation or abscess without bleeding: Secondary | ICD-10-CM | POA: Diagnosis not present

## 2017-09-14 DIAGNOSIS — M19041 Primary osteoarthritis, right hand: Secondary | ICD-10-CM | POA: Diagnosis present

## 2017-09-14 DIAGNOSIS — R0602 Shortness of breath: Secondary | ICD-10-CM | POA: Diagnosis not present

## 2017-09-14 HISTORY — DX: Headache, unspecified: R51.9

## 2017-09-14 HISTORY — DX: Migraine, unspecified, not intractable, without status migrainosus: G43.909

## 2017-09-14 HISTORY — DX: Headache: R51

## 2017-09-14 HISTORY — PX: LEFT HEART CATH AND CORONARY ANGIOGRAPHY: CATH118249

## 2017-09-14 HISTORY — DX: Pneumonia, unspecified organism: J18.9

## 2017-09-14 HISTORY — DX: Essential (primary) hypertension: I10

## 2017-09-14 HISTORY — DX: Discoid lupus erythematosus: L93.0

## 2017-09-14 HISTORY — DX: Type 2 diabetes mellitus without complications: E11.9

## 2017-09-14 HISTORY — DX: Personal history of urinary calculi: Z87.442

## 2017-09-14 HISTORY — PX: CARDIAC CATHETERIZATION: SHX172

## 2017-09-14 LAB — URINALYSIS, ROUTINE W REFLEX MICROSCOPIC
Bacteria, UA: NONE SEEN
Bilirubin Urine: NEGATIVE
Glucose, UA: NEGATIVE mg/dL
Hgb urine dipstick: NEGATIVE
Ketones, ur: NEGATIVE mg/dL
Nitrite: NEGATIVE
Protein, ur: NEGATIVE mg/dL
Specific Gravity, Urine: 1.008 (ref 1.005–1.030)
pH: 6 (ref 5.0–8.0)

## 2017-09-14 LAB — BASIC METABOLIC PANEL
Anion gap: 8 (ref 5–15)
BUN: 10 mg/dL (ref 8–23)
CO2: 26 mmol/L (ref 22–32)
Calcium: 10.4 mg/dL — ABNORMAL HIGH (ref 8.9–10.3)
Chloride: 106 mmol/L (ref 98–111)
Creatinine, Ser: 1.04 mg/dL — ABNORMAL HIGH (ref 0.44–1.00)
GFR calc Af Amer: >60 mL/min (ref >60)
GFR calc non Af Amer: 54 mL/min — ABNORMAL LOW (ref >60)
Glucose, Bld: 116 mg/dL — ABNORMAL HIGH (ref 70–99)
Potassium: 4 mmol/L (ref 3.5–5.1)
Sodium: 140 mmol/L (ref 135–145)

## 2017-09-14 LAB — CBC
HCT: 43.3 % (ref 36.0–46.0)
Hemoglobin: 13.9 g/dL (ref 12.0–15.0)
MCH: 29.6 pg (ref 26.0–34.0)
MCHC: 32.1 g/dL (ref 30.0–36.0)
MCV: 92.3 fL (ref 78.0–100.0)
Platelets: 206 10*3/uL (ref 150–400)
RBC: 4.69 MIL/uL (ref 3.87–5.11)
RDW: 13.5 % (ref 11.5–15.5)
WBC: 7.5 10*3/uL (ref 4.0–10.5)

## 2017-09-14 LAB — I-STAT TROPONIN, ED: Troponin i, poc: 0 ng/mL (ref 0.00–0.08)

## 2017-09-14 LAB — LITHIUM LEVEL: Lithium Lvl: 0.76 mmol/L (ref 0.60–1.20)

## 2017-09-14 LAB — MAGNESIUM: Magnesium: 2.4 mg/dL (ref 1.7–2.4)

## 2017-09-14 LAB — TROPONIN I: Troponin I: <0.03 ng/mL (ref <0.03)

## 2017-09-14 LAB — VALPROIC ACID LEVEL: Valproic Acid Lvl: 47 ug/mL — ABNORMAL LOW (ref 50.0–100.0)

## 2017-09-14 SURGERY — LEFT HEART CATH AND CORONARY ANGIOGRAPHY
Anesthesia: LOCAL

## 2017-09-14 MED ORDER — LIDOCAINE HCL (PF) 1 % IJ SOLN
INTRAMUSCULAR | Status: DC | PRN
  Administered 2017-09-14: 2 mL

## 2017-09-14 MED ORDER — IOPAMIDOL (ISOVUE-300) INJECTION 61%
100.0000 mL | Freq: Once | INTRAVENOUS | Status: AC | PRN
  Administered 2017-09-14: 100 mL via INTRAVENOUS

## 2017-09-14 MED ORDER — IOPAMIDOL (ISOVUE-300) INJECTION 61%
INTRAVENOUS | Status: AC
  Filled 2017-09-14: qty 100

## 2017-09-14 MED ORDER — SODIUM CHLORIDE 0.9% FLUSH: 3.0000 mL | Freq: Two times a day (BID) | INTRAVENOUS | Status: DC

## 2017-09-14 MED ORDER — LIDOCAINE HCL (PF) 1 % IJ SOLN
INTRAMUSCULAR | Status: AC
  Filled 2017-09-14: qty 30

## 2017-09-14 MED ORDER — MIDAZOLAM HCL 2 MG/2ML IJ SOLN
INTRAMUSCULAR | Status: AC
  Filled 2017-09-14: qty 2

## 2017-09-14 MED ORDER — HEPARIN SODIUM (PORCINE) 5000 UNIT/ML IJ SOLN: 5000.0000 [IU] | Freq: Three times a day (TID) | INTRAMUSCULAR | Status: DC

## 2017-09-14 MED ORDER — HEPARIN (PORCINE) IN NACL 100-0.45 UNIT/ML-% IJ SOLN
800.0000 [IU]/h | INTRAMUSCULAR | Status: DC
  Filled 2017-09-14: qty 250

## 2017-09-14 MED ORDER — IOPAMIDOL (ISOVUE-370) INJECTION 76%
INTRAVENOUS | Status: DC | PRN
  Administered 2017-09-14: 85 mL via INTRA_ARTERIAL

## 2017-09-14 MED ORDER — MORPHINE SULFATE (PF) 4 MG/ML IV SOLN
4.0000 mg | Freq: Once | INTRAVENOUS | Status: AC
  Administered 2017-09-14: 4 mg via INTRAVENOUS
  Filled 2017-09-14: qty 1

## 2017-09-14 MED ORDER — ACETAMINOPHEN 325 MG PO TABS: 650.0000 mg | ORAL_TABLET | ORAL | Status: DC | PRN

## 2017-09-14 MED ORDER — ONDANSETRON HCL 4 MG/2ML IJ SOLN: 4.0000 mg | Freq: Four times a day (QID) | INTRAMUSCULAR | Status: DC | PRN

## 2017-09-14 MED ORDER — CLOPIDOGREL BISULFATE 300 MG PO TABS
300.0000 mg | ORAL_TABLET | Freq: Once | ORAL | Status: AC
  Administered 2017-09-14: 300 mg via ORAL
  Filled 2017-09-14: qty 1

## 2017-09-14 MED ORDER — HEPARIN (PORCINE) IN NACL 1000-0.9 UT/500ML-% IV SOLN
INTRAVENOUS | Status: AC
  Filled 2017-09-14: qty 1000

## 2017-09-14 MED ORDER — MIDAZOLAM HCL 2 MG/2ML IJ SOLN
INTRAMUSCULAR | Status: DC | PRN
  Administered 2017-09-14 (×2): 1 mg via INTRAVENOUS

## 2017-09-14 MED ORDER — VERAPAMIL HCL 2.5 MG/ML IV SOLN
INTRAVENOUS | Status: AC
  Filled 2017-09-14: qty 2

## 2017-09-14 MED ORDER — SODIUM CHLORIDE 0.9 % IV SOLN: 250.0000 mL | INTRAVENOUS | Status: DC | PRN

## 2017-09-14 MED ORDER — FENTANYL CITRATE (PF) 100 MCG/2ML IJ SOLN
INTRAMUSCULAR | Status: AC
  Filled 2017-09-14: qty 2

## 2017-09-14 MED ORDER — SODIUM CHLORIDE 0.9% FLUSH: 3.0000 mL | INTRAVENOUS | Status: DC | PRN

## 2017-09-14 MED ORDER — ASPIRIN 325 MG PO TABS
ORAL_TABLET | ORAL | Status: AC
  Filled 2017-09-14: qty 1

## 2017-09-14 MED ORDER — SODIUM CHLORIDE 0.9 % IV SOLN
INTRAVENOUS | Status: AC
  Administered 2017-09-14: 13:00:00 via INTRAVENOUS

## 2017-09-14 MED ORDER — HEPARIN (PORCINE) IN NACL 2-0.9 UNITS/ML
INTRAMUSCULAR | Status: DC | PRN
  Administered 2017-09-14: 12:00:00 via INTRA_ARTERIAL

## 2017-09-14 MED ORDER — ASPIRIN 81 MG PO CHEW
CHEWABLE_TABLET | ORAL | Status: DC | PRN
  Administered 2017-09-14: 324 mg via ORAL

## 2017-09-14 MED ORDER — HEPARIN (PORCINE) IN NACL 1000-0.9 UT/500ML-% IV SOLN
INTRAVENOUS | Status: DC | PRN
  Administered 2017-09-14: 1000 mL

## 2017-09-14 MED ORDER — SUCRALFATE 1 G PO TABS
1.0000 g | ORAL_TABLET | Freq: Once | ORAL | Status: AC
  Administered 2017-09-14: 1 g via ORAL
  Filled 2017-09-14: qty 1

## 2017-09-14 MED ORDER — ALPRAZOLAM 0.5 MG PO TABS: 0.5000 mg | ORAL_TABLET | Freq: Two times a day (BID) | ORAL | Status: DC | PRN

## 2017-09-14 MED ORDER — NITROGLYCERIN 0.4 MG SL SUBL
0.4000 mg | SUBLINGUAL_TABLET | SUBLINGUAL | Status: DC | PRN
  Administered 2017-09-14 (×2): 0.4 mg via SUBLINGUAL
  Filled 2017-09-14 (×2): qty 1

## 2017-09-14 MED ORDER — NITROGLYCERIN 0.4 MG SL SUBL: 0.4000 mg | SUBLINGUAL_TABLET | SUBLINGUAL | 2 refills | Status: AC | PRN

## 2017-09-14 MED ORDER — LACTATED RINGERS IV BOLUS
1000.0000 mL | Freq: Once | INTRAVENOUS | Status: AC
Start: 2017-09-14 — End: 2017-09-14
  Administered 2017-09-14: 1000 mL via INTRAVENOUS

## 2017-09-14 MED ORDER — FENTANYL CITRATE (PF) 100 MCG/2ML IJ SOLN
INTRAMUSCULAR | Status: DC | PRN
  Administered 2017-09-14: 50 ug via INTRAVENOUS

## 2017-09-14 MED ORDER — HEPARIN BOLUS VIA INFUSION
4000.0000 [IU] | Freq: Once | INTRAVENOUS | Status: DC
  Filled 2017-09-14: qty 4000

## 2017-09-14 MED ORDER — IOPAMIDOL (ISOVUE-370) INJECTION 76%
INTRAVENOUS | Status: AC
  Filled 2017-09-14: qty 125

## 2017-09-14 SURGICAL SUPPLY — 11 items
CATH 5FR JL3.5 JR4 ANG PIG MP (CATHETERS) ×1 IMPLANT
DEVICE RAD COMP TR BAND LRG (VASCULAR PRODUCTS) ×1 IMPLANT
ELECT DEFIB PAD ADLT CADENCE (PAD) ×1 IMPLANT
GLIDESHEATH SLEND A-KIT 6F 22G (SHEATH) ×1 IMPLANT
GUIDEWIRE INQWIRE 1.5J.035X260 (WIRE) IMPLANT
INQWIRE 1.5J .035X260CM (WIRE) ×2
KIT ENCORE 26 ADVANTAGE (KITS) ×1 IMPLANT
KIT HEART LEFT (KITS) ×2 IMPLANT
PACK CARDIAC CATHETERIZATION (CUSTOM PROCEDURE TRAY) ×2 IMPLANT
TRANSDUCER W/STOPCOCK (MISCELLANEOUS) ×2 IMPLANT
TUBING CIL FLEX 10 FLL-RA (TUBING) ×2 IMPLANT

## 2017-09-14 NOTE — ED Provider Notes (Addendum)
[facility] DEPT Provider Note   CSN: [id number] Arrival date & time: 09/14/17  0165     History   Chief Complaint Chief Complaint  Patient presents with  . Chest Pain  . Abdominal Pain    HPI [name] [name] is a 72 y.o. female.  HPI  72 year old female with history of colitis, diabetes, hypertension and hyperlipidemia comes in with chief complaint of abdominal pain and chest pain.  Patient reports that she has had abdominal pain for the last several days.  She was diagnosed with diverticulitis and is on Levaquin and Flagyl.  Patient was on Cipro initially, but her symptoms have not improved.  Additionally she is also being treated for cystitis with the same antibiotic regimen.  Patient denies any fevers, but is having subjective chills, weakness and nausea.  Yesterday patient started having diarrhea and she had 5+ episodes of loose bowel movements overnight without any blood.  Additionally, patient also complains of chest heaviness that started last night.  Chest heaviness has been constant and there is exertional component to the pain with associated shortness of breath.  Patient has no known history of CAD.    Past Medical History:  Diagnosis Date  . Alcohol abuse   . Anxiety    takes Valium daily as needed and Ativan daily  . Bilateral hearing loss   . Bipolar 1 disorder ([place])    takes Lithium nightly and Synthroid daily  . Chronic back pain    DDD  . Colitis, ischemic ([place]) 2012  . Confusion    r/t meds  . Depression    takes Prozac daily and Bupspirone   . Diabetes ([place])   . Diabetes mellitus without complication ([place])    takes Janumet daily  . Diverticulosis   . Diverticulosis   . Dyslipidemia    takes Crestor daily  . Fibromyalgia   . Hepatic steatosis 06/18/12   severe  . History of bronchitis    last time at least 25yrs ago  . Hyperlipidemia   . Hypothyroidism   . IBS (irritable bowel syndrome)   . Ischemic colitis ([place])    . Joint pain   . Joint swelling   . Lupus ([place])    tumid-skin  . Nephrolithiasis   . Numbness    in right foot  . Osteoarthritis    in hips  . Osteoarthritis cervical spine   . Osteoarthritis of hand    bilateral  . Restless leg syndrome    takes Requip nightly  . Sciatica   . Urinary frequency   . Urinary leakage   . Urinary urgency   . Urinary, incontinence, stress female   . Walking pneumonia    last time more than 75yrs ago    Patient Active Problem List   Diagnosis Date Noted  . Diverticulitis 11/19/2016  . Hypothyroidism 07/14/2016  . Diarrhea   . Generalized abdominal pain   . Major depressive disorder, recurrent severe without psychotic features ([place]) 12/31/2015  . Nausea without vomiting   . Colitis 10/28/2015  . Unintentional poisoning by psychotropic drug 07/05/2015  . Restless leg syndrome 07/05/2015  . Diabetic polyneuropathy associated with type 2 diabetes mellitus ([place]) 09/21/2014  . Arthritis of left hip 06/09/2013  . Arthritis of right hip 06/08/2013  . Hip pain, right 05/02/2013  . IBS (irritable bowel syndrome) 01/02/2013  . Unspecified vitamin D deficiency 07/05/2012  . Pure hyperglyceridemia 07/04/2012  . Diabetes mellitus with diabetic neuropathy, without long-term current use of insulin (  [place]) 06/18/2012  . Hypertension 06/18/2012  . Bipolar 1 disorder ([place]) 06/18/2012  . Low back pain 02/25/2011  . Acute ischemic colitis ([place]) 01/06/2011  . Sciatica 12/19/2010  . Fibromyalgia 10/28/2010  . Primary osteoarthritis of right hip 10/24/2010  . Hyperlipidemia with target low density lipoprotein (LDL) cholesterol less than 100 mg/dL 05/05/2010  . DEPRESSION/ANXIETY 05/05/2010  . ABUSE, ALCOHOL, IN REMISSION 05/05/2010  . OTITIS MEDIA, CHRONIC 05/05/2010  . Hearing loss 05/05/2010  . ALLERGIC RHINITIS, SEASONAL, MILD 05/05/2010  . URINARY INCONTINENCE, STRESS, FEMALE 05/05/2010  . OSTEOARTHRITIS, HANDS, BILATERAL 05/05/2010  . OSTEOARTHRITIS,  CERVICAL SPINE 05/05/2010    Past Surgical History:  Procedure Laterality Date  . APPENDECTOMY    . COLONOSCOPY    . D&C x 4    . DENTAL SURGERY Left 2 weeks ago    dental implant  . ESOPHAGOGASTRODUODENOSCOPY    . excision of tumors at right neck     angle of jaw '68, benign  . FLEXIBLE SIGMOIDOSCOPY N/A 06/21/2012   Procedure: FLEXIBLE SIGMOIDOSCOPY;  Surgeon: Jerene Bears, MD;  Location: [facility];  Service: Gastroenterology;  Laterality: N/A;  . PARTIAL HYSTERECTOMY    . TONSILLECTOMY    . TOTAL HIP ARTHROPLASTY Right 06/09/2013   Procedure: TOTAL HIP ARTHROPLASTY;  Surgeon: Kerin Salen, MD;  Location: [facility];  Service: Orthopedics;  Laterality: Right;  . TUBAL LIGATION       OB History   None      Home Medications    Prior to Admission medications   Medication Sig Start Date End Date Taking? Authorizing Provider  ALPRAZolam Duanne Moron) 0.5 MG tablet Take 2 mg by mouth 2 (two) times daily as needed for anxiety.   Yes [provider]  atorvastatin (LIPITOR) 40 MG tablet Take 40 mg by mouth at bedtime.    Yes [provider]  baclofen (LIORESAL) 10 MG tablet Take 20 mg by mouth 3 (three) times daily as needed for muscle spasms.   Yes [provider]  BD PEN NEEDLE NANO U/F 32G X 4 MM MISC INJECT FIVE TIMES DAILY AS DIRECTED 06/17/17  Yes Elayne Snare, MD  Biotin 1000 MCG tablet Take 2,000 mcg by mouth 3 (three) times daily.   Yes [provider]  cholecalciferol (VITAMIN D) 1000 units tablet Take 2,000 Units by mouth daily.    Yes [provider]  dicyclomine (BENTYL) 10 MG capsule Take 1 capsule (10 mg total) by mouth every 8 (eight) hours as needed for spasms. 12/29/16  Yes Danis, Kirke Corin, MD  divalproex (DEPAKOTE ER) 250 MG 24 hr tablet Take 500 mg by mouth at bedtime.    Yes [provider]  donepezil (ARICEPT) 10 MG tablet donepezil 10 mg tablet every evening   Yes [provider]  gabapentin (NEURONTIN) 100  MG capsule Take 300 mg by mouth 3 (three) times daily.    Yes [provider]  glucose blood (CONTOUR NEXT TEST) test strip Use to test blood sugar 2 times daily 12/21/16  Yes Elayne Snare, MD  HUMALOG KWIKPEN 100 UNIT/ML KiwkPen INJECT 6 UNITS AT BREAKFAST AND LUNCH AND 8 UNITS BEFORE SUPPER 02/03/17  Yes Elayne Snare, MD  IBU 800 MG tablet Take 800 mg by mouth 3 (three) times daily as needed for pain. 02/10/17  Yes [provider]  Lancets 30G MISC 1 each by Does not apply route daily. 09/13/17  Yes Elayne Snare, MD  LANTUS SOLOSTAR 100 UNIT/ML Solostar Pen INJECT 34  UNITS SUBCUTANEOUS DAILY AT BEDTIME 07/25/17  Yes Elayne Snare, MD  levofloxacin (LEVAQUIN) 750 MG tablet Take 750 mg by mouth daily. For 7 days   Yes [provider]  levothyroxine (SYNTHROID, LEVOTHROID) 75 MCG tablet Take 1 tablet (75 mcg total) by mouth daily before breakfast. I tablet daily before breakfast 08/18/17  Yes Elayne Snare, MD  lithium carbonate (ESKALITH) 450 MG CR tablet Take 450 mg by mouth at bedtime.   Yes [provider]  losartan (COZAAR) 50 MG tablet Take 25 mg by mouth daily.    Yes [provider]  Melatonin 3 MG TABS Take 9 mg by mouth at bedtime.   Yes [provider]  memantine (NAMENDA) 10 MG tablet Take 10 mg by mouth every evening.    Yes [provider]  ondansetron (ZOFRAN ODT) 8 MG disintegrating tablet Take 1 tablet (8 mg total) by mouth every 8 (eight) hours as needed for nausea or vomiting. 03/13/17  Yes Dorie Rank, MD  OZEMPIC 0.25 or 0.5 MG/DOSE SOPN INJECT 0.5 MG INTO THE SKIN ONCE WEEKLY 05/25/17  Yes Elayne Snare, MD  polyethylene glycol [facility]) packet Take 17 g by mouth daily as needed. 11/20/16  Yes Lavina Hamman, MD  QUEtiapine (SEROQUEL) 100 MG tablet Take 100 mg by mouth at bedtime. Take 100mg  at 5PM and 300mg  at 7:30PM (bedtime)   Yes [provider]  QUEtiapine (SEROQUEL) 200 MG tablet Take 100 mg by mouth at 5 pm and 300 mg  by mouth at bedtime   Yes [provider]  rOPINIRole (REQUIP) 0.5 MG tablet Take 1.5 mg by mouth 2 (two) times daily. 1mg  at 5 PM and 2mg  at 7:30 PM   Yes [provider]  terconazole (TERAZOL 7) 0.4 % vaginal cream Place 1 applicator vaginally daily. For 7 days 09/07/17  Yes [provider]  traMADol (ULTRAM) 50 MG tablet Take 1 tablet (50 mg total) by mouth every 6 (six) hours as needed. 03/13/17  Yes Dorie Rank, MD  zaleplon (SONATA) 5 MG capsule Take 5 mg by mouth every other day.   Yes [provider]  cephALEXin (KEFLEX) 500 MG capsule Take 1 capsule (500 mg total) by mouth 2 (two) times daily. Patient not taking: Reported on 09/14/2017 03/13/17   Dorie Rank, MD  ondansetron (ZOFRAN) 4 MG tablet Take 1 tablet (4 mg total) by mouth every 8 (eight) hours as needed for nausea or vomiting. Patient not taking: Reported on 09/14/2017 11/20/16   Lavina Hamman, MD  oxyCODONE (OXY IR/ROXICODONE) 5 MG immediate release tablet Take 1 tablet (5 mg total) by mouth every 6 (six) hours as needed for severe pain. Patient not taking: Reported on 09/14/2017 11/20/16   Lavina Hamman, MD  phenazopyridine (PYRIDIUM) 200 MG tablet Take 1 tablet (200 mg total) by mouth 3 (three) times daily. Patient not taking: Reported on 09/14/2017 03/13/17   Dorie Rank, MD    Family History Family History  Problem Relation Age of Onset  . Drug abuse Mother   . Alcohol abuse Mother   . Alcohol abuse Father   . Hypertension Father   . Alcohol abuse Brother        x 2  . CAD Brother   . Hypertension Brother     Social History Social History   Tobacco Use  . Smoking status: Never Smoker  . Smokeless tobacco: Never Used  Substance Use Topics  . Alcohol use: No  . Drug use: No  Allergies   Codeine; Macrolides and ketolides; Procaine hcl; Aspirin; Benadryl [diphenhydramine]; Diflucan [fluconazole]; Dilaudid [hydromorphone hcl]; Other; and Sulfa antibiotics   Review of Systems Review  of Systems  Constitutional: Positive for activity change.  Respiratory: Positive for shortness of breath.   Cardiovascular: Positive for chest pain.  Gastrointestinal: Positive for abdominal pain and nausea.  All other systems reviewed and are negative.    Physical Exam Updated Vital Signs BP 122/70   Pulse 76   Temp 98.8 F (37.1 C) (Oral)   Resp 18   Ht 5\' 2"  (1.575 m)   Wt 78.5 kg (173 lb)   SpO2 94%   BMI 31.64 kg/m   Physical Exam  Constitutional: She is oriented to person, place, and time. She appears well-developed.  HENT:  Head: Normocephalic and atraumatic.  Eyes: EOM are normal.  Neck: Normal range of motion. Neck supple.  Cardiovascular: Normal rate, intact distal pulses and normal pulses.  Pulmonary/Chest: Effort normal.  Abdominal: Soft. Bowel sounds are normal. There is tenderness. There is guarding. There is no rebound.  Neurological: She is alert and oriented to person, place, and time.  Skin: Skin is warm and dry.  Nursing note and vitals reviewed.    ED Treatments / Results  Labs (all labs ordered are listed, but only abnormal results are displayed) Labs Reviewed  BASIC METABOLIC PANEL - Abnormal; Notable for the following components:      Result Value   Glucose, Bld 116 (*)    Creatinine, Ser 1.04 (*)    Calcium 10.4 (*)    GFR calc non Af Amer 54 (*)    All other components within normal limits  VALPROIC ACID LEVEL - Abnormal; Notable for the following components:   Valproic Acid Lvl 47 (*)    All other components within normal limits  URINE CULTURE  C DIFFICILE QUICK SCREEN W PCR REFLEX  GASTROINTESTINAL PANEL BY PCR, STOOL (REPLACES STOOL CULTURE)  CBC  TROPONIN I  LITHIUM LEVEL  MAGNESIUM  TROPONIN I  URINALYSIS, ROUTINE W REFLEX MICROSCOPIC  I-STAT TROPONIN, ED  I-STAT TROPONIN, ED    EKG EKG Interpretation  Date/Time:  Tuesday September 14 2017 07:48:14 EDT Ventricular Rate:  76 PR Interval:    QRS Duration: 91 QT  Interval:  398 QTC Calculation: 448 R Axis:   55 Text Interpretation:  Sinus rhythm Low voltage, precordial leads No acute changes Nonspecific ST and T wave abnormality Confirmed by [name] [phone]) on 09/14/2017 8:26:18 AM   Radiology Ct Abdomen Pelvis W Contrast  Result Date: 09/14/2017 CLINICAL DATA:  Generalized acute abdominal pain.  Left-sided pain EXAM: CT ABDOMEN AND PELVIS WITH CONTRAST TECHNIQUE: Multidetector CT imaging of the abdomen and pelvis was performed using the standard protocol following bolus administration of intravenous contrast. CONTRAST:  155mL ISOVUE-300 IOPAMIDOL (ISOVUE-300) INJECTION 61% COMPARISON:  CT abdomen pelvis 03/13/2017 FINDINGS: Lower chest: 6 mm nodule left lower lobe unchanged. Nodule also unchanged from CT abdomen 12/31/2014. Otherwise negative lung bases Hepatobiliary: Normal liver.  Gallbladder and bile ducts normal. Pancreas: Mild dilatation of the pancreatic duct measuring 4 mm is chronic but appears mildly progressive. No pancreatic mass or edema. No calcification or atrophy in the pancreas. Spleen: Negative Adrenals/Urinary Tract: Chronic right renal atrophy with dilated renal pelvis and calices. This is chronic and unchanged from prior study. Right ureter nondilated. Findings most compatible with right UPJ obstruction. Left kidney normal. Interval resolution of bladder wall thickening since the prior study suggesting cystitis which has resolved. Stomach/Bowel:  Negative for bowel obstruction. Sigmoid diverticulosis without diverticulitis. Appendectomy Vascular/Lymphatic: Negative Reproductive: Hysterectomy.  Negative for pelvic mass. Other: Negative for free fluid. Musculoskeletal: No acute skeletal abnormality. Lumbar mild degenerative changes. IMPRESSION: 1. 6 mm left lower lobe nodule is stable from prior studies and considered benign 2. Chronic pancreatic ductal dilatation 4 mm with mild progression. No underlying mass or edema. 3. Chronic right renal  atrophy with dilated collecting system suggestive of right UPJ obstruction which is chronic. Interval resolution of bladder wall thickening since the prior CT 4. Sigmoid diverticulosis without diverticulitis. Electronically Signed   By: Franchot Gallo M.D.   On: 09/14/2017 11:04   Dg Chest Portable 1 View  Result Date: 09/14/2017 CLINICAL DATA:  Mid chest pain over the last 24 hours, some shortness of breath EXAM: PORTABLE CHEST 1 VIEW COMPARISON:  Chest x-ray of 12/03/2016 FINDINGS: No active infiltrate or effusion is seen. Mediastinal and hilar contours are unremarkable and the heart is within upper limits normal. Thoracolumbar scoliosis is unchanged. IMPRESSION: 1. No active lung disease. 2. No change in mild thoracolumbar scoliosis. Electronically Signed   By: Ivar Drape M.D.   On: 09/14/2017 09:02    Procedures Procedures (including critical care time)  CRITICAL CARE Performed by: Noelani Harbach   Total critical care time: 48 minutes  Critical care time was exclusive of separately billable procedures and treating other patients.  Critical care was necessary to treat or prevent imminent or life-threatening deterioration.  Critical care was time spent personally by me on the following activities: development of treatment plan with patient and/or surrogate as well as nursing, discussions with consultants, evaluation of patient's response to treatment, examination of patient, obtaining history from patient or surrogate, ordering and performing treatments and interventions, ordering and review of laboratory studies, ordering and review of radiographic studies, pulse oximetry and re-evaluation of patient's condition.   Medications Ordered in ED Medications  nitroGLYCERIN (NITROSTAT) SL tablet 0.4 mg (0.4 mg Sublingual Given 09/14/17 1030)  iopamidol (ISOVUE-300) 61 % injection (has no administration in time range)  heparin ADULT infusion 100 units/mL (25000 units/253mL sodium chloride 0.45%)  (has no administration in time range)  heparin bolus via infusion 4,000 Units (has no administration in time range)  clopidogrel (PLAVIX) tablet 300 mg (300 mg Oral Given 09/14/17 0858)  sucralfate (CARAFATE) tablet 1 g (1 g Oral Given 09/14/17 0858)  morphine 4 MG/ML injection 4 mg (4 mg Intravenous Given 09/14/17 0857)  lactated ringers bolus 1,000 mL (0 mLs Intravenous Stopped 09/14/17 1004)  iopamidol (ISOVUE-300) 61 % injection 100 mL (100 mLs Intravenous Contrast Given 09/14/17 1038)     Initial Impression / Assessment and Plan / ED Course  I have reviewed the triage vital signs and the nursing notes.  Pertinent labs & imaging results that were available during my care of the patient were reviewed by me and considered in my medical decision making (see chart for details).  Clinical Course as of Sep 15 1138  Tue Sep 14, 2017  1018 Patient reassessed.  She still having chest discomfort, although the pain has improved. Cardiology consulted. Dr. Acie Fredrickson will see the patient. He is aware that I am concerned for unstable angina.   [AN]  76 Dr. Acie Fredrickson has seen the patient. He is concerned about an inferior STEMI - STEMI activation made and Dr. Tamala Julian, interventional team is aware. Pt is having minimal discomfort now. Heparin bolus given. [facility] to transport.   [AN]    Clinical Course User Index [AN] [place],  Clair Bardwell, MD    72 year old female comes in with chief complaint of chest pain and abdominal pain. Patient has history of diabetes, hypertension and hyperlipidemia.  Her chest pain is described as heaviness, with radiation towards her scapular region.  Patient has associated shortness of breath.  Patient given nitroglycerin and her pain has persisted.  Morphine has also improved the pain slightly only.  Cardiology will be consulted, given that our concern is that patient is having unstable angina.  Dissection, esophageal spasm, peptic ulcer disease also considered in the differential diagnosis  but thought to be less likely than ACS.  Finally, patient is also having abdominal pain in the left lower quadrant and suprapubic region.  She has been on antibiotics for the last several weeks, being treated for cystitis and now diverticulitis -and her symptoms have gotten worse.  She is also now having diarrhea.  CT scan has been ordered to see if there is any complication of diverticulitis.  Additionally, stool studies have also been sent to see if there is any new C. difficile colitis.  11:40 AM Results from the ER workup discussed with the patient face to face and all questions answered to the best of my ability. Pt aware that the CT abdomen is reassuring.  Final Clinical Impressions(s) / ED Diagnoses   Final diagnoses:  Acute ST elevation myocardial infarction (STEMI) of inferior wall [facility])    ED Discharge Orders    None       [name], MD 09/14/17 1049    [name], MD 09/14/17 1141

## 2017-09-14 NOTE — Progress Notes (Signed)
[place] for heparin Indication: ACS/STEMI  Allergies  Allergen Reactions  . Codeine Anxiety and Other (See Comments)    Reaction:  Hallucinations  . Macrolides And Ketolides Nausea And Vomiting  . Procaine Hcl Palpitations  . Aspirin Nausea And Vomiting    Reaction:  Burns pts stomach   . Benadryl [Diphenhydramine]     Per MD "inhibits potency of gabapentin, lithium etc"  . Diflucan [Fluconazole] Other (See Comments)    Reaction:  Unknown   . Dilaudid [Hydromorphone Hcl] Other (See Comments)    Reaction:  Migraines and nightmares   . Other Nausea And Vomiting and Other (See Comments)    Pt states that all -mycins cause N/V.   Marland Kitchen Sulfa Antibiotics Other (See Comments)    Reaction:  Unknown     Patient Measurements: Height: 5\' 2"  (157.5 cm) Weight: 173 lb (78.5 kg) IBW/kg (Calculated) : 50.1 Heparin Dosing Weight: 67.4  Vital Signs: Temp: 97.4 F (36.3 C) (07/09 1252) Temp Source: Oral (07/09 1252) BP: 142/46 (07/09 1252) Pulse Rate: 69 (07/09 1252)  Labs: Recent Labs    09/14/17 0900  HGB 13.9  HCT 43.3  PLT 206  CREATININE 1.04*  TROPONINI <0.03    Estimated Creatinine Clearance: 49.6 mL/min (A) (by C-G formula based on SCr of 1.04 mg/dL (H)).   Medical History: Past Medical History:  Diagnosis Date  . Alcohol abuse   . Anxiety    takes Valium daily as needed and Ativan daily  . Bilateral hearing loss   . Bipolar 1 disorder ([place])    takes Lithium nightly and Synthroid daily  . Chronic back pain    DDD  . Colitis, ischemic ([place]) 2012  . Confusion    r/t meds  . Depression    takes Prozac daily and Bupspirone   . Diabetes ([place])   . Diabetes mellitus without complication ([place])    takes Janumet daily  . Diverticulosis   . Diverticulosis   . Dyslipidemia    takes Crestor daily  . Fibromyalgia   . Hepatic steatosis 06/18/12   severe  . History of bronchitis    last time at least 47yrs ago  . Hyperlipidemia    . Hypothyroidism   . IBS (irritable bowel syndrome)   . Ischemic colitis ([place])   . Joint pain   . Joint swelling   . Lupus ([place])    tumid-skin  . Nephrolithiasis   . Numbness    in right foot  . Osteoarthritis    in hips  . Osteoarthritis cervical spine   . Osteoarthritis of hand    bilateral  . Restless leg syndrome    takes Requip nightly  . Sciatica   . Urinary frequency   . Urinary leakage   . Urinary urgency   . Urinary, incontinence, stress female   . Walking pneumonia    last time more than 28yrs ago   Assessment: 72 yo F s/p cath today. Heparin drip d/c'd post-op. No anticoagulants PTA. Pharmacy consulted to dose heparin for VTE prophylaxis 8 hours post-op. CBC wnl. No bleed documented. Sheath removed at 1221 per RN and TR will be removed shortly.  Goal of Therapy:  Heparin level 0.3-0.7 units/ml Monitor platelets by anticoagulation protocol: Yes   Plan:  Heparin 5000 units SQ q8h Monitor CBC, s/sx bleeding Rx will s/o consult  Elicia Lamp, PharmD, BCPS Clinical Pharmacist 09/14/2017 2:00 PM

## 2017-09-14 NOTE — Progress Notes (Signed)
ANTICOAGULATION CONSULT NOTE - Initial Consult  Pharmacy Consult for heparin Indication: ACS/STEMI  Allergies  Allergen Reactions  . Codeine Anxiety and Other (See Comments)    Reaction:  Hallucinations  . Macrolides And Ketolides Nausea And Vomiting  . Procaine Hcl Palpitations  . Aspirin Nausea And Vomiting    Reaction:  Burns pts stomach   . Benadryl [Diphenhydramine]     Per MD "inhibits potency of gabapentin, lithium etc"  . Diflucan [Fluconazole] Other (See Comments)    Reaction:  Unknown   . Dilaudid [Hydromorphone Hcl] Other (See Comments)    Reaction:  Migraines and nightmares   . Other Nausea And Vomiting and Other (See Comments)    Pt states that all -mycins cause N/V.   Marland Kitchen Sulfa Antibiotics Other (See Comments)    Reaction:  Unknown     Patient Measurements: Height: 5\' 2"  (157.5 cm) Weight: 173 lb (78.5 kg) IBW/kg (Calculated) : 50.1 Heparin Dosing Weight: 67.4  Vital Signs: Temp: 98.8 F (37.1 C) (07/09 0750) Temp Source: Oral (07/09 0750) BP: 122/70 (07/09 1031) Pulse Rate: 76 (07/09 1031)  Labs: Recent Labs    09/14/17 0900  HGB 13.9  HCT 43.3  PLT 206  CREATININE 1.04*  TROPONINI <0.03    Estimated Creatinine Clearance: 49.6 mL/min (A) (by C-G formula based on SCr of 1.04 mg/dL (H)).   Medical History: Past Medical History:  Diagnosis Date  . Alcohol abuse   . Anxiety    takes Valium daily as needed and Ativan daily  . Bilateral hearing loss   . Bipolar 1 disorder ([place])    takes Lithium nightly and Synthroid daily  . Chronic back pain    DDD  . Colitis, ischemic ([place]) 2012  . Confusion    r/t meds  . Depression    takes Prozac daily and Bupspirone   . Diabetes ([place])   . Diabetes mellitus without complication ([place])    takes Janumet daily  . Diverticulosis   . Diverticulosis   . Dyslipidemia    takes Crestor daily  . Fibromyalgia   . Hepatic steatosis 06/18/12   severe  . History of bronchitis    last time at least 89yrs ago   . Hyperlipidemia   . Hypothyroidism   . IBS (irritable bowel syndrome)   . Ischemic colitis ([place])   . Joint pain   . Joint swelling   . Lupus ([place])    tumid-skin  . Nephrolithiasis   . Numbness    in right foot  . Osteoarthritis    in hips  . Osteoarthritis cervical spine   . Osteoarthritis of hand    bilateral  . Restless leg syndrome    takes Requip nightly  . Sciatica   . Urinary frequency   . Urinary leakage   . Urinary urgency   . Urinary, incontinence, stress female   . Walking pneumonia    last time more than 75yrs ago   Assessment: 72 yo F to start heparin per pharmacy for ACS/STEMI.   No anticoagulants PTA.    Goal of Therapy:  Heparin level 0.3-0.7 units/ml Monitor platelets by anticoagulation protocol: Yes   Plan:  Give 4000 units bolus x 1 Start heparin infusion at 800 units/hr Check anti-Xa level in 8 hours and daily while on heparin Continue to monitor H&H and platelets  Eudelia Bunch, Pharm.D. [phone] 09/14/2017 10:58 AM

## 2017-09-14 NOTE — ED Triage Notes (Signed)
Walk in, c/o chest pain and abdominal pain. Recent DX of UTI and Diverticulitis. began Abx July 4.

## 2017-09-14 NOTE — ED Notes (Signed)
First [employer] with minimal result per Pt.
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
## 2017-09-14 NOTE — Discharge Summary (Addendum)
Discharge Summary    Patient ID: [name] [name],  MRN: [id number], DOB/AGE: 70-05-1945 70 y.o.  Admit date: 09/14/2017 Discharge date: 09/14/2017  Primary Care Provider: London Pepper Primary Cardiologist: Dr. Acie Fredrickson   Discharge Diagnoses    Active Problems:   Chest pain   Chest pain with normal coronary angiography   Allergies Allergies  Allergen Reactions  . Codeine Anxiety and Other (See Comments)    Reaction:  Hallucinations  . Macrolides And Ketolides Nausea And Vomiting  . Procaine Hcl Palpitations  . Aspirin Nausea And Vomiting    Reaction:  Burns pts stomach   . Benadryl [Diphenhydramine]     Per MD "inhibits potency of gabapentin, lithium etc"  . Diflucan [Fluconazole] Other (See Comments)    Reaction:  Unknown   . Dilaudid [Hydromorphone Hcl] Other (See Comments)    Reaction:  Migraines and nightmares   . Other Nausea And Vomiting and Other (See Comments)    Pt states that all -mycins cause N/V.   Marland Kitchen Sulfa Antibiotics Other (See Comments)    Reaction:  Unknown     Diagnostic Studies/Procedures    Procedures   LEFT HEART CATH AND CORONARY ANGIOGRAPHY  Conclusion    Widely patent coronary arteries.  Normal left main  Widely patent LAD with eccentric plaque in proximal to mid vessel obstructing up to 30%.  The apical LAD is small in diameter.  Cannot totally exclude the possibility of spontaneous dissection but this seems unlikely.  Dominant right coronary artery with no evidence of obstruction.  Normal left ventricular function.  Normal LVEDP.  EF 60%.  Recommendations:   No obstructive coronary disease identified.  No findings consistent with STEMI.  Clinical correlation and further work-up as needed by admitting team.       History of Present Illness     [name] [name] is a 72 y.o. female with a hx of DM, HTN, HLD  who presented to the [facility] ED on 09/14/17 with complaint of chest pain.  Pains started the middle of the night. Described as  tightness, heavy and felt like Indigestion.  Morphine helped slightly. She received SL NTG in the ED with marked improvement  but continued to have ongoing CP, down to a level of 2/10. Continued to have heaviness / tightness. Her EKG also showed slight ST elevation, new compared to previous EKGs. CODE STEMI was initially activated and pt was transported urgently to [facility] for emergent cardiac cath.   Hospital Course     On arrival to [facility], pt was taken to the cath lab, where procedure was performed by Dr. Tamala Julian. She was found to have normal coronaries. No findings c/w STEMI. EF was also normal at 60%. Pt left the cath lab in stable condition and was transferred to the post cath holding unit for observation. She had no complications. Radial access site was stable post TR band removal. VSS. Dr. Acie Fredrickson felt that possible esophageal spasm was the cause of her symptoms and recommended PRN SL NTG be called into her pharmacy. Pt was given an RX. Post hospital f/u will be arranged with an APP.    Consultants: none    Discharge Vitals Blood pressure (!) 114/56, pulse 74, temperature 97.6 F (36.4 C), temperature source Oral, resp. rate 15, height 5\' 2"  (1.575 m), weight 173 lb (78.5 kg), SpO2 94 %.  Filed Weights   09/14/17 0751  Weight: 173 lb (78.5 kg)    Labs & Radiologic Studies    CBC Recent  Labs    09/14/17 0900  WBC 7.5  HGB 13.9  HCT 43.3  MCV 92.3  PLT 967   Basic Metabolic Panel Recent Labs    09/14/17 0900  NA 140  K 4.0  CL 106  CO2 26  GLUCOSE 116*  BUN 10  CREATININE 1.04*  CALCIUM 10.4*  MG 2.4   Liver Function Tests No results for input(s): AST, ALT, ALKPHOS, BILITOT, PROT, ALBUMIN in the last 72 hours. No results for input(s): LIPASE, AMYLASE in the last 72 hours. Cardiac Enzymes Recent Labs    09/14/17 0900  TROPONINI <0.03   BNP Invalid input(s): POCBNP D-Dimer No results for input(s): DDIMER in the last 72 hours. Hemoglobin A1C No results for input(s):  HGBA1C in the last 72 hours. Fasting Lipid Panel No results for input(s): CHOL, HDL, LDLCALC, TRIG, CHOLHDL, LDLDIRECT in the last 72 hours. Thyroid Function Tests No results for input(s): TSH, T4TOTAL, T3FREE, THYROIDAB in the last 72 hours.  Invalid input(s): FREET3 _____________  Ct Abdomen Pelvis W Contrast  Result Date: 09/14/2017 CLINICAL DATA:  Generalized acute abdominal pain.  Left-sided pain EXAM: CT ABDOMEN AND PELVIS WITH CONTRAST TECHNIQUE: Multidetector CT imaging of the abdomen and pelvis was performed using the standard protocol following bolus administration of intravenous contrast. CONTRAST:  181mL ISOVUE-300 IOPAMIDOL (ISOVUE-300) INJECTION 61% COMPARISON:  CT abdomen pelvis 03/13/2017 FINDINGS: Lower chest: 6 mm nodule left lower lobe unchanged. Nodule also unchanged from CT abdomen 12/31/2014. Otherwise negative lung bases Hepatobiliary: Normal liver.  Gallbladder and bile ducts normal. Pancreas: Mild dilatation of the pancreatic duct measuring 4 mm is chronic but appears mildly progressive. No pancreatic mass or edema. No calcification or atrophy in the pancreas. Spleen: Negative Adrenals/Urinary Tract: Chronic right renal atrophy with dilated renal pelvis and calices. This is chronic and unchanged from prior study. Right ureter nondilated. Findings most compatible with right UPJ obstruction. Left kidney normal. Interval resolution of bladder wall thickening since the prior study suggesting cystitis which has resolved. Stomach/Bowel: Negative for bowel obstruction. Sigmoid diverticulosis without diverticulitis. Appendectomy Vascular/Lymphatic: Negative Reproductive: Hysterectomy.  Negative for pelvic mass. Other: Negative for free fluid. Musculoskeletal: No acute skeletal abnormality. Lumbar mild degenerative changes. IMPRESSION: 1. 6 mm left lower lobe nodule is stable from prior studies and considered benign 2. Chronic pancreatic ductal dilatation 4 mm with mild progression. No  underlying mass or edema. 3. Chronic right renal atrophy with dilated collecting system suggestive of right UPJ obstruction which is chronic. Interval resolution of bladder wall thickening since the prior CT 4. Sigmoid diverticulosis without diverticulitis. Electronically Signed   By: Franchot Gallo M.D.   On: 09/14/2017 11:04   Dg Chest Portable 1 View  Result Date: 09/14/2017 CLINICAL DATA:  Mid chest pain over the last 24 hours, some shortness of breath EXAM: PORTABLE CHEST 1 VIEW COMPARISON:  Chest x-ray of 12/03/2016 FINDINGS: No active infiltrate or effusion is seen. Mediastinal and hilar contours are unremarkable and the heart is within upper limits normal. Thoracolumbar scoliosis is unchanged. IMPRESSION: 1. No active lung disease. 2. No change in mild thoracolumbar scoliosis. Electronically Signed   By: Ivar Drape M.D.   On: 09/14/2017 09:02   Disposition   Pt is being discharged home today in good condition.  Follow-up Plans & Appointments    Follow-up Information    Nahser, Wonda Cheng, MD Follow up.   Specialty:  Cardiology Why:  our office will call you with a post hospital follow-up  Contact information: [address]  [address] [place] [address] [phone]            Discharge Medications   Allergies as of 09/14/2017      Reactions   Codeine Anxiety, Other (See Comments)   Reaction:  Hallucinations   Macrolides And Ketolides Nausea And Vomiting   Procaine Hcl Palpitations   Aspirin Nausea And Vomiting   Reaction:  Burns pts stomach    Benadryl [diphenhydramine]    Per MD "inhibits potency of gabapentin, lithium etc"   Diflucan [fluconazole] Other (See Comments)   Reaction:  Unknown    Dilaudid [hydromorphone Hcl] Other (See Comments)   Reaction:  Migraines and nightmares    Other Nausea And Vomiting, Other (See Comments)   Pt states that all -mycins cause N/V.    Sulfa Antibiotics Other (See Comments)   Reaction:  Unknown       Medication List      STOP taking these medications   cephALEXin 500 MG capsule Commonly known as:  KEFLEX   ondansetron 4 MG tablet Commonly known as:  ZOFRAN     TAKE these medications   ALPRAZolam 0.5 MG tablet Commonly known as:  XANAX Take 2 mg by mouth 2 (two) times daily as needed for anxiety.   atorvastatin 40 MG tablet Commonly known as:  LIPITOR Take 40 mg by mouth at bedtime.   baclofen 10 MG tablet Commonly known as:  LIORESAL Take 20 mg by mouth 3 (three) times daily as needed for muscle spasms.   BD PEN NEEDLE NANO U/F 32G X 4 MM Misc Generic drug:  Insulin Pen Needle INJECT FIVE TIMES DAILY AS DIRECTED   Biotin 1000 MCG tablet Take 2,000 mcg by mouth 3 (three) times daily.   cholecalciferol 1000 units tablet Commonly known as:  VITAMIN D Take 2,000 Units by mouth daily.   dicyclomine 10 MG capsule Commonly known as:  BENTYL Take 1 capsule (10 mg total) by mouth every 8 (eight) hours as needed for spasms.   divalproex 250 MG 24 hr tablet Commonly known as:  DEPAKOTE ER Take 500 mg by mouth at bedtime.   donepezil 10 MG tablet Commonly known as:  ARICEPT donepezil 10 mg tablet every evening   gabapentin 100 MG capsule Commonly known as:  NEURONTIN Take 300 mg by mouth 3 (three) times daily.   glucose blood test strip Commonly known as:  CONTOUR NEXT TEST Use to test blood sugar 2 times daily   HUMALOG KWIKPEN 100 UNIT/ML KiwkPen Generic drug:  insulin lispro INJECT 6 UNITS AT BREAKFAST AND LUNCH AND 8 UNITS BEFORE SUPPER   IBU 800 MG tablet Generic drug:  ibuprofen Take 800 mg by mouth 3 (three) times daily as needed for pain.   Lancets 30G Misc 1 each by Does not apply route daily.   LANTUS SOLOSTAR 100 UNIT/ML Solostar Pen Generic drug:  Insulin Glargine INJECT 34 UNITS SUBCUTANEOUS DAILY AT BEDTIME   levofloxacin 750 MG tablet Commonly known as:  LEVAQUIN Take 750 mg by mouth daily. For 7 days   levothyroxine 75 MCG tablet Commonly known as:   SYNTHROID, LEVOTHROID Take 1 tablet (75 mcg total) by mouth daily before breakfast. I tablet daily before breakfast   lithium carbonate 450 MG CR tablet Commonly known as:  ESKALITH Take 450 mg by mouth at bedtime.   losartan 50 MG tablet Commonly known as:  COZAAR Take 25 mg by mouth daily.   Melatonin 3 MG Tabs Take 9 mg by mouth at bedtime.  memantine 10 MG tablet Commonly known as:  NAMENDA Take 10 mg by mouth every evening.   nitroGLYCERIN 0.4 MG SL tablet Commonly known as:  NITROSTAT Place 1 tablet (0.4 mg total) under the tongue every 5 (five) minutes as needed for chest pain.   ondansetron 8 MG disintegrating tablet Commonly known as:  ZOFRAN ODT Take 1 tablet (8 mg total) by mouth every 8 (eight) hours as needed for nausea or vomiting.   oxyCODONE 5 MG immediate release tablet Commonly known as:  Oxy IR/ROXICODONE Take 1 tablet (5 mg total) by mouth every 6 (six) hours as needed for severe pain.   OZEMPIC 0.25 or 0.5 MG/DOSE Sopn Generic drug:  Semaglutide INJECT 0.5 MG INTO THE SKIN ONCE WEEKLY   phenazopyridine 200 MG tablet Commonly known as:  PYRIDIUM Take 1 tablet (200 mg total) by mouth 3 (three) times daily.   polyethylene glycol packet Commonly known as:  MIRALAX Take 17 g by mouth daily as needed.   QUEtiapine 200 MG tablet Commonly known as:  SEROQUEL Take 100 mg by mouth at 5 pm and 300 mg by mouth at bedtime   QUEtiapine 100 MG tablet Commonly known as:  SEROQUEL Take 100 mg by mouth at bedtime. Take 100mg  at 5PM and 300mg  at 7:30PM (bedtime)   rOPINIRole 0.5 MG tablet Commonly known as:  REQUIP Take 1.5 mg by mouth 2 (two) times daily. 1mg  at 5 PM and 2mg  at 7:30 PM   terconazole 0.4 % vaginal cream Commonly known as:  TERAZOL 7 Place 1 applicator vaginally daily. For 7 days   traMADol 50 MG tablet Commonly known as:  ULTRAM Take 1 tablet (50 mg total) by mouth every 6 (six) hours as needed.   zaleplon 5 MG capsule Commonly known  as:  SONATA Take 5 mg by mouth every other day.        Acute coronary syndrome (MI, NSTEMI, STEMI, etc) this admission?: No.    Outstanding Labs/Studies   None   Duration of Discharge Encounter   Greater than 30 minutes including physician time.  Signed, Lyda Jester, PA-C 09/14/2017, 4:59 PM  Attending Note:   The patient was seen and examined.  Agree with assessment and plan as noted above.  Changes made to the above note as needed.  Patient seen and independently examined with Lyda Jester, PA .   We discussed all aspects of the encounter. I agree with the assessment and plan as stated above.  1.   Chest pain :   Cath showed normal coronaries. She is feeling better.   She may have had some spasm. Will be able to DC her tonight.   Will give NTG.  Prn.   Follow up with her primary MD    I have spent a total of 40 minutes with patient reviewing hospital  notes , telemetry, EKGs, labs and examining patient as well as establishing an assessment and plan that was discussed with the patient. > 50% of time was spent in direct patient care.    [name], Brooke Bonito., MD, [facility] 09/15/2017, 5:05 PM [address],  [place] Pager [phone]

## 2017-09-14 NOTE — Progress Notes (Signed)
TR BAND REMOVAL  LOCATION:    right radial  DEFLATED PER PROTOCOL:    Yes.    TIME BAND OFF / DRESSING APPLIED:    1445   SITE UPON ARRIVAL:    Level 0  SITE AFTER BAND REMOVAL:    Level 0  CIRCULATION SENSATION AND MOVEMENT:    Within Normal Limits   Yes.    COMMENTS:   Tolerated procedure well 

## 2017-09-15 LAB — GLUCOSE, CAPILLARY: Glucose-Capillary: 92 mg/dL (ref 70–99)

## 2017-09-15 LAB — URINE CULTURE: Culture: NO GROWTH

## 2017-09-17 ENCOUNTER — Other Ambulatory Visit (INDEPENDENT_AMBULATORY_CARE_PROVIDER_SITE_OTHER): Payer: Medicare Other

## 2017-09-17 DIAGNOSIS — E1165 Type 2 diabetes mellitus with hyperglycemia: Secondary | ICD-10-CM | POA: Diagnosis not present

## 2017-09-17 DIAGNOSIS — Z794 Long term (current) use of insulin: Secondary | ICD-10-CM

## 2017-09-17 LAB — COMPREHENSIVE METABOLIC PANEL
ALT: 15 U/L (ref 0–35)
AST: 15 U/L (ref 0–37)
Albumin: 4 g/dL (ref 3.5–5.2)
Alkaline Phosphatase: 80 U/L (ref 39–117)
BUN: 8 mg/dL (ref 6–23)
CO2: 25 mEq/L (ref 19–32)
Calcium: 10 mg/dL (ref 8.4–10.5)
Chloride: 107 mEq/L (ref 96–112)
Creatinine, Ser: 1.11 mg/dL (ref 0.40–1.20)
GFR: 51.7 mL/min — ABNORMAL LOW (ref >60.00)
Glucose, Bld: 123 mg/dL — ABNORMAL HIGH (ref 70–99)
Potassium: 3.8 mEq/L (ref 3.5–5.1)
Sodium: 142 mEq/L (ref 135–145)
Total Bilirubin: 0.3 mg/dL (ref 0.2–1.2)
Total Protein: 6.6 g/dL (ref 6.0–8.3)

## 2017-09-17 LAB — HEMOGLOBIN A1C: Hgb A1c MFr Bld: 6.3 % (ref 4.6–6.5)

## 2017-09-17 LAB — LIPID PANEL
Cholesterol: 249 mg/dL — ABNORMAL HIGH (ref 0–200)
HDL: 49.8 mg/dL (ref >39.00)
Total CHOL/HDL Ratio: 5
Triglycerides: 405 mg/dL — ABNORMAL HIGH (ref 0.0–149.0)

## 2017-09-17 LAB — LDL CHOLESTEROL, DIRECT: Direct LDL: 146 mg/dL

## 2017-09-20 ENCOUNTER — Encounter: Payer: Self-pay | Admitting: Endocrinology

## 2017-09-20 ENCOUNTER — Ambulatory Visit (INDEPENDENT_AMBULATORY_CARE_PROVIDER_SITE_OTHER): Payer: Medicare Other | Admitting: Endocrinology

## 2017-09-20 VITALS — BP 112/72 | HR 95 | Ht 62.0 in | Wt 173.0 lb

## 2017-09-20 DIAGNOSIS — E039 Hypothyroidism, unspecified: Secondary | ICD-10-CM

## 2017-09-20 DIAGNOSIS — E1165 Type 2 diabetes mellitus with hyperglycemia: Secondary | ICD-10-CM

## 2017-09-20 DIAGNOSIS — E782 Mixed hyperlipidemia: Secondary | ICD-10-CM | POA: Diagnosis not present

## 2017-09-20 DIAGNOSIS — Z794 Long term (current) use of insulin: Secondary | ICD-10-CM | POA: Diagnosis not present

## 2017-09-20 NOTE — Progress Notes (Signed)
Patient ID: [name] [name], female   DOB: 10/10/1945, 72 y.o.   MRN: [id number]            Reason for Appointment: Follow-up for Type 2 Diabetes   History of Present Illness:          Date of diagnosis of type 2 diabetes mellitus: ?  2014        Background history:   She thinks her blood sugar was 400 at the time of diagnosis but no detailed records of this are available She did have an A1c of 10.6 done in 2014 and was probably given Lantus for some time initially Apparently she was treated with various medications including metformin, Janumet and Tradgenta. Her blood sugars had improved and A1c in 2015 was down to 6.2 She tends to have diarrhea with metformin and Janumet and also she thinks it causes dry mouth Most likely Janumet was stopped in 06/2015 Glipizide was started in 8/17 when blood sugars were higher and A1c was 9%  Recent history:   INSULIN regimen is:   Lantus 34 units daily at 5 pm  Humalog: Not taking now  Non-insulin hypoglycemic drugs the patient is taking are: Ozempic 0.5 mg weekly  Her A1c previously was consistently over 8%, now again below 7% at 6.3  Current management, blood sugar patterns and problems identified:  She has been taking Humalog at all since her last visit and not sure when and why she stopped this  Also has checked blood sugars much more infrequently than before  She does have variable fasting readings  Currently is not watching her diet and since she and her husband work in [employer] business she is frequently eating casseroles and desserts  She does take her Ozempic and Lantus regularly  Currently not motivated to exercise but her weight has been about the same  She thinks that generally her portions are not large and her husband feels this way also  He does not have any readings after meals, previously had a few readings over 200 also   Side effects from medications have been:?  Diarrhea from metformin and  Janumet  Compliance with the medical regimen: Inconsistent  Glucose monitoring:  done 1 time a day or less        Glucometer: Contour        Blood Glucose readings by time of day and averages from monitor download:  She has only 6 readings in the last month ranging from 120 up to 175, all fasting   Self-care: The diet that the patient has been following AL:PFXT, tries to limit drinks with sugar .      Typical meal intake: Breakfast at 9,  May be eggs and sausage and lunch is usually a sandwich   Dinner 6 pm               Dietician visit, most recent: 10/18               Exercise:  Minimal  Weight history:  Wt Readings from Last 3 Encounters:  09/20/17 173 lb (78.5 kg)  09/14/17 173 lb (78.5 kg)  06/21/17 175 lb 3.2 oz (79.5 kg)    Glycemic control:   Lab Results  Component Value Date   HGBA1C 6.3 09/17/2017   HGBA1C 6.6 (H) 06/18/2017   HGBA1C 6.5 03/22/2017   Lab Results  Component Value Date   MICROALBUR <0.7 07/10/2016   CREATININE 1.11 09/17/2017   Lab Results  Component Value Date  MICRALBCREAT 0.5 07/10/2016       Allergies as of 09/20/2017      Reactions   Codeine Anxiety, Other (See Comments)   Reaction:  Hallucinations   Macrolides And Ketolides Nausea And Vomiting   Procaine Hcl Palpitations   Aspirin Nausea And Vomiting   Reaction:  Burns pts stomach    Benadryl [diphenhydramine]    Per MD "inhibits potency of gabapentin, lithium etc"   Diflucan [fluconazole] Other (See Comments)   Reaction:  Unknown    Dilaudid [hydromorphone Hcl] Other (See Comments)   Reaction:  Migraines and nightmares    Other Nausea And Vomiting, Other (See Comments)   Pt states that all -mycins cause N/V.    Sulfa Antibiotics Other (See Comments)   Reaction:  Unknown       Medication List        Accurate as of 09/20/17  3:10 PM. Always use your most recent med list.          ALPRAZolam 0.5 MG tablet Commonly known as:  XANAX Take 2 mg by mouth 2 (two)  times daily as needed for anxiety.   atorvastatin 40 MG tablet Commonly known as:  LIPITOR Take 40 mg by mouth at bedtime.   baclofen 10 MG tablet Commonly known as:  LIORESAL Take 20 mg by mouth 3 (three) times daily as needed for muscle spasms.   BD PEN NEEDLE NANO U/F 32G X 4 MM Misc Generic drug:  Insulin Pen Needle INJECT FIVE TIMES DAILY AS DIRECTED   Biotin 1000 MCG tablet Take 2,000 mcg by mouth 3 (three) times daily.   cholecalciferol 1000 units tablet Commonly known as:  VITAMIN D Take 2,000 Units by mouth daily.   dicyclomine 10 MG capsule Commonly known as:  BENTYL Take 1 capsule (10 mg total) by mouth every 8 (eight) hours as needed for spasms.   divalproex 250 MG 24 hr tablet Commonly known as:  DEPAKOTE ER Take 500 mg by mouth at bedtime.   donepezil 10 MG tablet Commonly known as:  ARICEPT donepezil 10 mg tablet every evening   gabapentin 100 MG capsule Commonly known as:  NEURONTIN Take 300 mg by mouth 3 (three) times daily.   glucose blood test strip Commonly known as:  CONTOUR NEXT TEST Use to test blood sugar 2 times daily   IBU 800 MG tablet Generic drug:  ibuprofen Take 800 mg by mouth 3 (three) times daily as needed for pain.   Lancets 30G Misc 1 each by Does not apply route daily.   LANTUS SOLOSTAR 100 UNIT/ML Solostar Pen Generic drug:  Insulin Glargine INJECT 34 UNITS SUBCUTANEOUS DAILY AT BEDTIME   levothyroxine 75 MCG tablet Commonly known as:  SYNTHROID, LEVOTHROID Take 1 tablet (75 mcg total) by mouth daily before breakfast. I tablet daily before breakfast   lithium carbonate 450 MG CR tablet Commonly known as:  ESKALITH Take 450 mg by mouth at bedtime.   losartan 50 MG tablet Commonly known as:  COZAAR Take 25 mg by mouth daily.   Melatonin 3 MG Tabs Take 9 mg by mouth at bedtime.   memantine 10 MG tablet Commonly known as:  NAMENDA Take 10 mg by mouth every evening.   nitroGLYCERIN 0.4 MG SL tablet Commonly known  as:  NITROSTAT Place 1 tablet (0.4 mg total) under the tongue every 5 (five) minutes as needed for chest pain.   ondansetron 8 MG disintegrating tablet Commonly known as:  ZOFRAN ODT Take 1 tablet (8  mg total) by mouth every 8 (eight) hours as needed for nausea or vomiting.   OZEMPIC 0.25 or 0.5 MG/DOSE Sopn Generic drug:  Semaglutide INJECT 0.5 MG INTO THE SKIN ONCE WEEKLY   polyethylene glycol packet Commonly known as:  MIRALAX Take 17 g by mouth daily as needed.   QUEtiapine 200 MG tablet Commonly known as:  SEROQUEL Take 100 mg by mouth at 5 pm and 300 mg by mouth at bedtime   rOPINIRole 0.5 MG tablet Commonly known as:  REQUIP Take 1.5 mg by mouth 2 (two) times daily. 1mg  at 5 PM and 2mg  at 7:30 PM   traMADol 50 MG tablet Commonly known as:  ULTRAM Take 1 tablet (50 mg total) by mouth every 6 (six) hours as needed.   zaleplon 5 MG capsule Commonly known as:  SONATA Take 5 mg by mouth every other day.       Allergies:  Allergies  Allergen Reactions  . Codeine Anxiety and Other (See Comments)    Reaction:  Hallucinations  . Macrolides And Ketolides Nausea And Vomiting  . Procaine Hcl Palpitations  . Aspirin Nausea And Vomiting    Reaction:  Burns pts stomach   . Benadryl [Diphenhydramine]     Per MD "inhibits potency of gabapentin, lithium etc"  . Diflucan [Fluconazole] Other (See Comments)    Reaction:  Unknown   . Dilaudid [Hydromorphone Hcl] Other (See Comments)    Reaction:  Migraines and nightmares   . Other Nausea And Vomiting and Other (See Comments)    Pt states that all -mycins cause N/V.   Marland Kitchen Sulfa Antibiotics Other (See Comments)    Reaction:  Unknown     Past Medical History:  Diagnosis Date  . Alcohol abuse   . Anxiety    takes Valium daily as needed and Ativan daily  . Bilateral hearing loss   . Bipolar 1 disorder ([place])    takes Lithium nightly and Synthroid daily  . Chronic back pain    DDD; "all over" (09/14/2017)  . Colitis, ischemic  ([place]) 2012  . Confusion    r/t meds  . Depression    takes Prozac daily and Bupspirone   . Diverticulosis   . Diverticulosis   . Dyslipidemia    takes Crestor daily  . Fibromyalgia   . Headache    "weekly" (09/14/2017)  . Hepatic steatosis 06/18/12   severe  . History of kidney stones   . Hyperlipidemia   . Hypertension   . Hypothyroidism   . IBS (irritable bowel syndrome)   . Ischemic colitis ([place])   . Joint pain   . Joint swelling   . Lupus erythematosus tumidus    tumid-skin  . Migraine    "1-2/yr; maybe" (09/14/2017)  . Numbness    in right foot  . Osteoarthritis    "all over" (09/14/2017)  . Osteoarthritis cervical spine   . Osteoarthritis of hand    bilateral  . Pneumonia    "walking pneumonia several times; long time since the last time" (09/14/2017)  . Restless leg syndrome    takes Requip nightly  . Sciatica   . Type II diabetes mellitus ([place])   . Urinary frequency   . Urinary leakage   . Urinary urgency   . Urinary, incontinence, stress female   . Walking pneumonia    last time more than 40yrs ago    Past Surgical History:  Procedure Laterality Date  . ABDOMINAL HYSTERECTOMY     "they left  my ovaries"  . APPENDECTOMY    . CARDIAC CATHETERIZATION  09/14/2017  . COLONOSCOPY    . DENTAL SURGERY Left 10/2016   dental implant  . DILATION AND CURETTAGE OF UTERUS  X 4  . ESOPHAGOGASTRODUODENOSCOPY    . FLEXIBLE SIGMOIDOSCOPY N/A 06/21/2012   Procedure: FLEXIBLE SIGMOIDOSCOPY;  Surgeon: Jerene Bears, MD;  Location: [facility];  Service: Gastroenterology;  Laterality: N/A;  . JOINT REPLACEMENT    . LEFT HEART CATH AND CORONARY ANGIOGRAPHY N/A 09/14/2017   Procedure: LEFT HEART CATH AND CORONARY ANGIOGRAPHY;  Surgeon: Belva Crome, MD;  Location: [facility];  Service: Cardiovascular;  Laterality: N/A;  . SHOULDER ARTHROSCOPY Right    "shaved spurs off rotator cuff"  . TONSILLECTOMY    . TOTAL HIP ARTHROPLASTY Right 06/09/2013   Procedure: TOTAL HIP  ARTHROPLASTY;  Surgeon: Kerin Salen, MD;  Location: [facility];  Service: Orthopedics;  Laterality: Right;  . TUBAL LIGATION    . TUMOR EXCISION Right 1968   angle of jaw; benign    Family History  Problem Relation Age of Onset  . Drug abuse Mother   . Alcohol abuse Mother   . Alcohol abuse Father   . Hypertension Father   . Alcohol abuse Brother        x 2  . CAD Brother   . Hypertension Brother     Social History:  reports that she has never smoked. She has never used smokeless tobacco. She reports that she drank alcohol. She reports that she does not use drugs.   Review of Systems    Lipid history: On Lipitor 40 mg prescribed by her PCP Last LDL under 75  and this is now 146 She is due to follow-up with her PCP soon  Triglycerides  high and were relatively high recently even though she was fasting Her diet has been inconsistent with relatively high fat foods She thinks she is taking her Lipitor very regularly including recently   Lab Results  Component Value Date   CHOL 249 (H) 09/17/2017   HDL 49.80 09/17/2017   LDLDIRECT 146.0 09/17/2017   TRIG (H) 09/17/2017    405.0 Triglyceride is over 400; calculations on Lipids are invalid.   CHOLHDL 5 09/17/2017            Most recent eye exam was In 6/18  Most recent foot exam:03/2016  THYROID: She has been on levothyroxine and Cytomel from her psychiatrist for several years with uncertain diagnosis Currently taking levothyroxine 75 g Has also been on Cytomel 25 g, last free T3 was normal Also on lithium  Labs as follows:  Lab Results  Component Value Date   TSH 3.86 03/22/2017   TSH 0.05 (L) 07/10/2016   TSH 0.26 (L) 04/30/2016   FREET4 1.03 03/22/2017   FREET4 0.65 07/10/2016   FREET4 0.56 (L) 04/30/2016   Lab Results  Component Value Date   T3FREE 3.0 03/22/2017   T3FREE 3.2 07/10/2016   T3FREE 2.7 04/30/2016    She has atypical depression on multiple drugs     Physical Examination:  BP 112/72  (BP Location: Left Arm, Patient Position: Sitting, Cuff Size: Normal)   Pulse 95   Ht 5\' 2"  (1.575 m)   Wt 173 lb (78.5 kg)   SpO2 92%   BMI 31.64 kg/m          ASSESSMENT:  Diabetes type 2, on insulin  See history of present illness for detailed discussion of current  diabetes management, blood sugar patterns and problems identified  She is on Ozempic, basal insulin only recently.  Her A1c is still about the same and fairly good at 6.3 This is despite her not being watching her diet or taking her Humalog    LIPIDS: Currently being followed by PCP and she is due for follow-up Most likely she needs to have separate prescription for triglycerides since they are over 400 now Also not clear why her LDL is higher at 149 Given her a list of foods to avoid and modify her diet for low saturated fat intake  Hypothyroidism, possibly secondary: Will need follow-up labs in the next visit   PLAN:  Discussed needing to check her sugars after evening meal consistently and this may help her with dietary compliance also Encouraged her to start walking also For now will not restart Humalog unless her A1c goes up She does need to try and improve her diet consistently as discussed above  Patient Instructions  Much more testing after meals  Check blood sugars on waking up  2-3/7  Also check blood sugars about 2 hours after a meal and do this after different meals by rotation  Recommended blood sugar levels on waking up is 90-130 and about 2 hours after meal is 130-160  Please bring your blood sugar monitor to each visit, thank you   Better diet            Elayne Snare 09/20/2017, 3:10 PM   Note: This office note was prepared with Dragon voice recognition system technology. Any transcriptional errors that result from this process are unintentional.

## 2017-09-20 NOTE — Patient Instructions (Addendum)
Much more testing after meals  Check blood sugars on waking up  2-3/7  Also check blood sugars about 2 hours after a meal and do this after different meals by rotation  Recommended blood sugar levels on waking up is 90-130 and about 2 hours after meal is 130-160  Please bring your blood sugar monitor to each visit, thank you   Better diet

## 2017-09-22 DIAGNOSIS — E1165 Type 2 diabetes mellitus with hyperglycemia: Secondary | ICD-10-CM | POA: Diagnosis not present

## 2017-09-22 DIAGNOSIS — I10 Essential (primary) hypertension: Secondary | ICD-10-CM | POA: Diagnosis not present

## 2017-09-22 DIAGNOSIS — E785 Hyperlipidemia, unspecified: Secondary | ICD-10-CM | POA: Diagnosis not present

## 2017-09-22 DIAGNOSIS — Z6833 Body mass index (BMI) 33.0-33.9, adult: Secondary | ICD-10-CM | POA: Diagnosis not present

## 2017-09-22 DIAGNOSIS — F319 Bipolar disorder, unspecified: Secondary | ICD-10-CM | POA: Diagnosis not present
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
## 2017-10-05 ENCOUNTER — Ambulatory Visit (INDEPENDENT_AMBULATORY_CARE_PROVIDER_SITE_OTHER): Payer: Self-pay

## 2017-10-05 ENCOUNTER — Ambulatory Visit (INDEPENDENT_AMBULATORY_CARE_PROVIDER_SITE_OTHER): Payer: Medicare Other

## 2017-10-05 ENCOUNTER — Ambulatory Visit (INDEPENDENT_AMBULATORY_CARE_PROVIDER_SITE_OTHER): Payer: Medicare Other | Admitting: Rheumatology

## 2017-10-05 ENCOUNTER — Encounter: Payer: Self-pay | Admitting: Rheumatology

## 2017-10-05 VITALS — BP 116/75 | HR 90 | Resp 16 | Ht 61.42 in | Wt 175.0 lb

## 2017-10-05 DIAGNOSIS — E559 Vitamin D deficiency, unspecified: Secondary | ICD-10-CM

## 2017-10-05 DIAGNOSIS — R5383 Other fatigue: Secondary | ICD-10-CM | POA: Diagnosis not present

## 2017-10-05 DIAGNOSIS — G8929 Other chronic pain: Secondary | ICD-10-CM

## 2017-10-05 DIAGNOSIS — M5442 Lumbago with sciatica, left side: Secondary | ICD-10-CM | POA: Diagnosis not present

## 2017-10-05 DIAGNOSIS — M533 Sacrococcygeal disorders, not elsewhere classified: Secondary | ICD-10-CM

## 2017-10-05 DIAGNOSIS — M2550 Pain in unspecified joint: Secondary | ICD-10-CM | POA: Diagnosis not present

## 2017-10-05 DIAGNOSIS — Z8659 Personal history of other mental and behavioral disorders: Secondary | ICD-10-CM | POA: Diagnosis not present

## 2017-10-05 DIAGNOSIS — M791 Myalgia, unspecified site: Secondary | ICD-10-CM | POA: Diagnosis not present

## 2017-10-05 DIAGNOSIS — Z8639 Personal history of other endocrine, nutritional and metabolic disease: Secondary | ICD-10-CM

## 2017-10-05 DIAGNOSIS — M25561 Pain in right knee: Secondary | ICD-10-CM | POA: Diagnosis not present

## 2017-10-05 DIAGNOSIS — M79641 Pain in right hand: Secondary | ICD-10-CM | POA: Diagnosis not present

## 2017-10-05 DIAGNOSIS — M79642 Pain in left hand: Secondary | ICD-10-CM

## 2017-10-05 DIAGNOSIS — N261 Atrophy of kidney (terminal): Secondary | ICD-10-CM | POA: Diagnosis not present

## 2017-10-05 DIAGNOSIS — M25562 Pain in left knee: Secondary | ICD-10-CM

## 2017-10-05 DIAGNOSIS — G2581 Restless legs syndrome: Secondary | ICD-10-CM

## 2017-10-05 DIAGNOSIS — Z96641 Presence of right artificial hip joint: Secondary | ICD-10-CM

## 2017-10-05 DIAGNOSIS — M797 Fibromyalgia: Secondary | ICD-10-CM | POA: Diagnosis not present

## 2017-10-05 DIAGNOSIS — M79671 Pain in right foot: Secondary | ICD-10-CM

## 2017-10-05 DIAGNOSIS — M79672 Pain in left foot: Secondary | ICD-10-CM

## 2017-10-05 DIAGNOSIS — M5441 Lumbago with sciatica, right side: Secondary | ICD-10-CM

## 2017-10-05 DIAGNOSIS — Z8719 Personal history of other diseases of the digestive system: Secondary | ICD-10-CM

## 2017-10-06 DIAGNOSIS — F319 Bipolar disorder, unspecified: Secondary | ICD-10-CM | POA: Diagnosis not present

## 2017-10-07 DIAGNOSIS — M797 Fibromyalgia: Secondary | ICD-10-CM | POA: Diagnosis not present

## 2017-10-07 DIAGNOSIS — R5383 Other fatigue: Secondary | ICD-10-CM | POA: Diagnosis not present

## 2017-10-07 DIAGNOSIS — F319 Bipolar disorder, unspecified: Secondary | ICD-10-CM | POA: Diagnosis not present

## 2017-10-07 LAB — URIC ACID: Uric Acid, Serum: 6.2 mg/dL (ref 2.5–7.0)

## 2017-10-07 LAB — ANA: Anti Nuclear Antibody(ANA): NEGATIVE

## 2017-10-07 LAB — PROTEIN ELECTROPHORESIS, SERUM, WITH REFLEX
Albumin ELP: 3.9 g/dL (ref 3.8–4.8)
Alpha 1: 0.3 g/dL (ref 0.2–0.3)
Alpha 2: 0.9 g/dL (ref 0.5–0.9)
Beta 2: 0.3 g/dL (ref 0.2–0.5)
Beta Globulin: 0.6 g/dL (ref 0.4–0.6)
Gamma Globulin: 0.6 g/dL — ABNORMAL LOW (ref 0.8–1.7)
Total Protein: 6.6 g/dL (ref 6.1–8.1)

## 2017-10-07 LAB — CYCLIC CITRUL PEPTIDE ANTIBODY, IGG: Cyclic Citrullin Peptide Ab: <16 U

## 2017-10-07 LAB — RHEUMATOID FACTOR: Rhuematoid fact SerPl-aCnc: <14 IU/mL (ref <14)

## 2017-10-13 ENCOUNTER — Ambulatory Visit (INDEPENDENT_AMBULATORY_CARE_PROVIDER_SITE_OTHER): Payer: Medicare Other | Admitting: Physician Assistant

## 2017-10-13 ENCOUNTER — Encounter: Payer: Self-pay | Admitting: Physician Assistant

## 2017-10-13 VITALS — BP 122/66 | HR 75 | Ht 62.0 in | Wt 176.0 lb

## 2017-10-13 DIAGNOSIS — E785 Hyperlipidemia, unspecified: Secondary | ICD-10-CM | POA: Diagnosis not present

## 2017-10-13 DIAGNOSIS — I10 Essential (primary) hypertension: Secondary | ICD-10-CM | POA: Diagnosis not present

## 2017-10-13 DIAGNOSIS — R079 Chest pain, unspecified: Secondary | ICD-10-CM

## 2017-10-13 DIAGNOSIS — E0840 Diabetes mellitus due to underlying condition with diabetic neuropathy, unspecified: Secondary | ICD-10-CM | POA: Diagnosis not present

## 2017-10-13 MED ORDER — ICOSAPENT ETHYL 1 G PO CAPS: 2.0000 g | ORAL_CAPSULE | Freq: Two times a day (BID) | ORAL | 3 refills | Status: DC

## 2017-10-13 NOTE — Patient Instructions (Addendum)
Medication Instructions:  Your physician has recommended you make the following change in your medication:   1. START VASCEPA 1 MG - TAKE TWO TABLETS TWICE DAILY  Labwork: LABS TO BE DONE SAME DAY WITH [facility] IN 3-4 MONTHS: FASTING LIPIDS, LFTS  Testing/Procedures: None ordered  Follow-Up: Your physician wants you to follow-up in: 3-4 MONTHS WITH DR. Acie Fredrickson.   Any Other Special Instructions Will Be Listed Below (If Applicable).     If you need a refill on your cardiac medications before your next appointment, please call your pharmacy.
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
## 2017-11-04 DIAGNOSIS — F319 Bipolar disorder, unspecified: Secondary | ICD-10-CM | POA: Diagnosis not present

## 2017-11-15 ENCOUNTER — Other Ambulatory Visit: Payer: Self-pay | Admitting: Endocrinology

## 2017-11-16 ENCOUNTER — Ambulatory Visit: Payer: Self-pay | Admitting: Rheumatology

## 2017-11-16 DIAGNOSIS — F319 Bipolar disorder, unspecified: Secondary | ICD-10-CM | POA: Diagnosis not present

## 2017-11-17 ENCOUNTER — Other Ambulatory Visit: Payer: Self-pay | Admitting: Psychiatry

## 2017-11-17 DIAGNOSIS — Z79899 Other long term (current) drug therapy: Secondary | ICD-10-CM | POA: Diagnosis not present

## 2017-11-18 ENCOUNTER — Telehealth: Payer: Self-pay | Admitting: Endocrinology

## 2017-11-18 LAB — COMPLETE METABOLIC PANEL WITH GFR
AG Ratio: 1.9 (calc) (ref 1.0–2.5)
ALT: 12 U/L (ref 6–29)
AST: 12 U/L (ref 10–35)
Albumin: 4.3 g/dL (ref 3.6–5.1)
Alkaline phosphatase (APISO): 84 U/L (ref 33–130)
BUN/Creatinine Ratio: 11 (calc) (ref 6–22)
BUN: 11 mg/dL (ref 7–25)
CO2: 28 mmol/L (ref 20–32)
Calcium: 10.3 mg/dL (ref 8.6–10.4)
Chloride: 101 mmol/L (ref 98–110)
Creat: 1 mg/dL — ABNORMAL HIGH (ref 0.50–0.99)
GFR, Est African American: 67 mL/min/{1.73_m2} (ref >=60)
GFR, Est Non African American: 57 mL/min/{1.73_m2} — ABNORMAL LOW (ref >=60)
Globulin: 2.3 g/dL (calc) (ref 1.9–3.7)
Glucose, Bld: 147 mg/dL — ABNORMAL HIGH (ref 65–99)
Potassium: 4.7 mmol/L (ref 3.5–5.3)
Sodium: 137 mmol/L (ref 135–146)
Total Bilirubin: 0.5 mg/dL (ref 0.2–1.2)
Total Protein: 6.6 g/dL (ref 6.1–8.1)

## 2017-11-18 LAB — LITHIUM LEVEL: Lithium Lvl: 0.8 mmol/L (ref 0.6–1.2)

## 2017-11-18 LAB — AMMONIA: Ammonia: 46 umol/L (ref <=72)

## 2017-11-18 LAB — VALPROIC ACID LEVEL: Valproic Acid Lvl: 64.3 mg/L (ref 50.0–100.0)

## 2017-11-18 NOTE — Telephone Encounter (Signed)
[employer] is calling in regards to a PA that was submitted.  They have some additional question that they need answered.     Ph- [phone] Option 5

## 2017-11-25 DIAGNOSIS — F319 Bipolar disorder, unspecified: Secondary | ICD-10-CM | POA: Diagnosis not present

## 2017-11-28 ENCOUNTER — Other Ambulatory Visit: Payer: Self-pay | Admitting: Endocrinology

## 2017-12-02 DIAGNOSIS — F319 Bipolar disorder, unspecified: Secondary | ICD-10-CM | POA: Diagnosis not present

## 2017-12-04 DIAGNOSIS — L309 Dermatitis, unspecified: Secondary | ICD-10-CM | POA: Diagnosis not present

## 2017-12-09 ENCOUNTER — Telehealth: Payer: Self-pay

## 2017-12-09 NOTE — Telephone Encounter (Signed)
Loma Sousa,  Would you let us know if this has been addressed please? Please just notate that it has if so, so we can remove from the box. Thank you

## 2017-12-09 NOTE — Telephone Encounter (Signed)
This is in regards to [name] [name]. Not noted in chart that a prior authorization was received or iniated. It was a issue with the quanity. Was sent for 3 and the quanity is needing to be 2. Sent updated RX for the allowed quanity.

## 2017-12-09 NOTE — Telephone Encounter (Signed)
Called to discuss with pt, no answer lmtcb.

## 2017-12-09 NOTE — Telephone Encounter (Signed)
Gave patient information about Ozempic

## 2017-12-09 NOTE — Telephone Encounter (Signed)
Pt called on 11/26/2017 provider had chart just given to nurse. Pt c/o not doing well with the new saphris that was prescribed on 12/02/2017. She took one dose and had restless legs/arms for 5 hours. Pt said she tried taking sonata 5mg  hs again, previous rx and its helping her sleep. Will let provider know of issues. Verified pharmacy [facility].

## 2017-12-10 ENCOUNTER — Other Ambulatory Visit: Payer: Self-pay

## 2017-12-10 MED ORDER — ZALEPLON 5 MG PO CAPS: 5.0000 mg | ORAL_CAPSULE | Freq: Every day | ORAL | 0 refills | Status: DC

## 2017-12-17 ENCOUNTER — Other Ambulatory Visit: Payer: Self-pay

## 2017-12-17 MED ORDER — DONEPEZIL HCL 10 MG PO TABS: ORAL_TABLET | ORAL | 0 refills | Status: DC

## 2017-12-17 MED ORDER — DIVALPROEX SODIUM ER 250 MG PO TB24: 500.0000 mg | ORAL_TABLET | Freq: Every day | ORAL | 0 refills | Status: DC

## 2017-12-20 ENCOUNTER — Other Ambulatory Visit: Payer: Medicare Other

## 2017-12-20 DIAGNOSIS — R6889 Other general symptoms and signs: Secondary | ICD-10-CM | POA: Diagnosis not present

## 2017-12-20 DIAGNOSIS — R509 Fever, unspecified: Secondary | ICD-10-CM | POA: Diagnosis not present

## 2017-12-20 DIAGNOSIS — R110 Nausea: Secondary | ICD-10-CM | POA: Diagnosis not present

## 2017-12-20 DIAGNOSIS — R5383 Other fatigue: Secondary | ICD-10-CM | POA: Diagnosis not present

## 2017-12-21 ENCOUNTER — Other Ambulatory Visit (INDEPENDENT_AMBULATORY_CARE_PROVIDER_SITE_OTHER): Payer: Medicare Other

## 2017-12-21 DIAGNOSIS — E039 Hypothyroidism, unspecified: Secondary | ICD-10-CM | POA: Diagnosis not present

## 2017-12-21 DIAGNOSIS — Z794 Long term (current) use of insulin: Secondary | ICD-10-CM | POA: Diagnosis not present

## 2017-12-21 DIAGNOSIS — E1165 Type 2 diabetes mellitus with hyperglycemia: Secondary | ICD-10-CM

## 2017-12-21 LAB — COMPREHENSIVE METABOLIC PANEL
ALT: 14 U/L (ref 0–35)
AST: 11 U/L (ref 0–37)
Albumin: 4 g/dL (ref 3.5–5.2)
Alkaline Phosphatase: 85 U/L (ref 39–117)
BUN: 16 mg/dL (ref 6–23)
CO2: 27 mEq/L (ref 19–32)
Calcium: 10.2 mg/dL (ref 8.4–10.5)
Chloride: 103 mEq/L (ref 96–112)
Creatinine, Ser: 0.95 mg/dL (ref 0.40–1.20)
GFR: 61.83 mL/min (ref >60.00)
Glucose, Bld: 165 mg/dL — ABNORMAL HIGH (ref 70–99)
Potassium: 4.6 mEq/L (ref 3.5–5.1)
Sodium: 137 mEq/L (ref 135–145)
Total Bilirubin: 0.4 mg/dL (ref 0.2–1.2)
Total Protein: 6.6 g/dL (ref 6.0–8.3)

## 2017-12-21 LAB — T3, FREE: T3, Free: 2.3 pg/mL (ref 2.3–4.2)

## 2017-12-21 LAB — T4, FREE: Free T4: 0.65 ng/dL (ref 0.60–1.60)

## 2017-12-21 LAB — TSH: TSH: 1.09 u[IU]/mL (ref 0.35–4.50)

## 2017-12-21 LAB — HEMOGLOBIN A1C: Hgb A1c MFr Bld: 7 % — ABNORMAL HIGH (ref 4.6–6.5)

## 2017-12-23 ENCOUNTER — Ambulatory Visit: Payer: Medicare Other | Admitting: Endocrinology

## 2017-12-27 DIAGNOSIS — R2689 Other abnormalities of gait and mobility: Secondary | ICD-10-CM | POA: Diagnosis not present

## 2017-12-27 DIAGNOSIS — R5383 Other fatigue: Secondary | ICD-10-CM | POA: Diagnosis not present

## 2017-12-27 DIAGNOSIS — G4700 Insomnia, unspecified: Secondary | ICD-10-CM | POA: Diagnosis not present

## 2018-01-01 ENCOUNTER — Other Ambulatory Visit: Payer: Self-pay

## 2018-01-01 ENCOUNTER — Emergency Department (HOSPITAL_COMMUNITY): Payer: Medicare Other

## 2018-01-01 ENCOUNTER — Encounter (HOSPITAL_COMMUNITY): Payer: Self-pay | Admitting: Emergency Medicine

## 2018-01-01 ENCOUNTER — Emergency Department (HOSPITAL_COMMUNITY)
Admission: EM | Admit: 2018-01-01 | Discharge: 2018-01-01 | Disposition: A | Discharge status: Home / Self Care | Payer: Medicare Other | Attending: Emergency Medicine | Admitting: Emergency Medicine

## 2018-01-01 DIAGNOSIS — Z96641 Presence of right artificial hip joint: Secondary | ICD-10-CM | POA: Insufficient documentation

## 2018-01-01 DIAGNOSIS — K5732 Diverticulitis of large intestine without perforation or abscess without bleeding: Secondary | ICD-10-CM | POA: Insufficient documentation

## 2018-01-01 DIAGNOSIS — Z794 Long term (current) use of insulin: Secondary | ICD-10-CM | POA: Insufficient documentation

## 2018-01-01 DIAGNOSIS — E1165 Type 2 diabetes mellitus with hyperglycemia: Secondary | ICD-10-CM | POA: Diagnosis not present

## 2018-01-01 DIAGNOSIS — I10 Essential (primary) hypertension: Secondary | ICD-10-CM | POA: Insufficient documentation

## 2018-01-01 DIAGNOSIS — Z79899 Other long term (current) drug therapy: Secondary | ICD-10-CM | POA: Insufficient documentation

## 2018-01-01 DIAGNOSIS — E039 Hypothyroidism, unspecified: Secondary | ICD-10-CM | POA: Diagnosis not present

## 2018-01-01 DIAGNOSIS — R1030 Lower abdominal pain, unspecified: Secondary | ICD-10-CM | POA: Diagnosis not present

## 2018-01-01 LAB — COMPREHENSIVE METABOLIC PANEL
ALT: 16 U/L (ref 0–44)
AST: 20 U/L (ref 15–41)
Albumin: 3.8 g/dL (ref 3.5–5.0)
Alkaline Phosphatase: 78 U/L (ref 38–126)
Anion gap: 10 (ref 5–15)
BUN: 9 mg/dL (ref 8–23)
CO2: 23 mmol/L (ref 22–32)
Calcium: 9.4 mg/dL (ref 8.9–10.3)
Chloride: 105 mmol/L (ref 98–111)
Creatinine, Ser: 0.86 mg/dL (ref 0.44–1.00)
GFR calc Af Amer: >60 mL/min (ref >60)
GFR calc non Af Amer: >60 mL/min (ref >60)
Glucose, Bld: 166 mg/dL — ABNORMAL HIGH (ref 70–99)
Potassium: 3.7 mmol/L (ref 3.5–5.1)
Sodium: 138 mmol/L (ref 135–145)
Total Bilirubin: 0.6 mg/dL (ref 0.3–1.2)
Total Protein: 6.7 g/dL (ref 6.5–8.1)

## 2018-01-01 LAB — URINALYSIS, ROUTINE W REFLEX MICROSCOPIC
Bacteria, UA: NONE SEEN
Bilirubin Urine: NEGATIVE
Glucose, UA: NEGATIVE mg/dL
Hgb urine dipstick: NEGATIVE
Ketones, ur: NEGATIVE mg/dL
Nitrite: NEGATIVE
Protein, ur: NEGATIVE mg/dL
Specific Gravity, Urine: 1.011 (ref 1.005–1.030)
pH: 6 (ref 5.0–8.0)

## 2018-01-01 LAB — VALPROIC ACID LEVEL: Valproic Acid Lvl: 41 ug/mL — ABNORMAL LOW (ref 50.0–100.0)

## 2018-01-01 LAB — CBC
HCT: 42.4 % (ref 36.0–46.0)
Hemoglobin: 13.1 g/dL (ref 12.0–15.0)
MCH: 29.2 pg (ref 26.0–34.0)
MCHC: 30.9 g/dL (ref 30.0–36.0)
MCV: 94.6 fL (ref 80.0–100.0)
Platelets: 211 10*3/uL (ref 150–400)
RBC: 4.48 MIL/uL (ref 3.87–5.11)
RDW: 13.2 % (ref 11.5–15.5)
WBC: 8.2 10*3/uL (ref 4.0–10.5)
nRBC: 0 % (ref 0.0–0.2)

## 2018-01-01 LAB — LIPASE, BLOOD: Lipase: 30 U/L (ref 11–51)

## 2018-01-01 LAB — LITHIUM LEVEL: Lithium Lvl: 0.9 mmol/L (ref 0.60–1.20)

## 2018-01-01 MED ORDER — METRONIDAZOLE 500 MG PO TABS: 500.0000 mg | ORAL_TABLET | Freq: Two times a day (BID) | ORAL | 0 refills | Status: DC

## 2018-01-01 MED ORDER — MORPHINE SULFATE (PF) 4 MG/ML IV SOLN
4.0000 mg | Freq: Once | INTRAVENOUS | Status: AC
  Administered 2018-01-01: 4 mg via INTRAVENOUS
  Filled 2018-01-01: qty 1

## 2018-01-01 MED ORDER — CIPROFLOXACIN HCL 500 MG PO TABS: 500.0000 mg | ORAL_TABLET | Freq: Two times a day (BID) | ORAL | 0 refills | Status: DC

## 2018-01-01 MED ORDER — IOPAMIDOL (ISOVUE-300) INJECTION 61%
INTRAVENOUS | Status: AC
  Administered 2018-01-01: 100 mL
  Filled 2018-01-01: qty 100

## 2018-01-01 MED ORDER — CIPROFLOXACIN HCL 500 MG PO TABS
500.0000 mg | ORAL_TABLET | Freq: Once | ORAL | Status: AC
  Administered 2018-01-01: 500 mg via ORAL
  Filled 2018-01-01: qty 1

## 2018-01-01 MED ORDER — METRONIDAZOLE 500 MG PO TABS
500.0000 mg | ORAL_TABLET | Freq: Once | ORAL | Status: AC
  Administered 2018-01-01: 500 mg via ORAL
  Filled 2018-01-01: qty 1

## 2018-01-01 MED ORDER — ONDANSETRON HCL 4 MG/2ML IJ SOLN
4.0000 mg | Freq: Once | INTRAMUSCULAR | Status: AC
  Administered 2018-01-01: 4 mg via INTRAVENOUS
  Filled 2018-01-01: qty 2

## 2018-01-01 MED ORDER — OXYCODONE-ACETAMINOPHEN 5-325 MG PO TABS: 1.0000 | ORAL_TABLET | Freq: Four times a day (QID) | ORAL | 0 refills | Status: DC | PRN

## 2018-01-01 MED ORDER — ONDANSETRON HCL 8 MG PO TABS: 8.0000 mg | ORAL_TABLET | Freq: Three times a day (TID) | ORAL | 0 refills | Status: DC | PRN

## 2018-01-01 MED ORDER — SODIUM CHLORIDE 0.9 % IV BOLUS
1000.0000 mL | Freq: Once | INTRAVENOUS | Status: AC
  Administered 2018-01-01: 1000 mL via INTRAVENOUS

## 2018-01-01 NOTE — ED Notes (Signed)
Patient transported to CT 

## 2018-01-01 NOTE — ED Notes (Signed)
Urine culture sent down to lab with UA

## 2018-01-01 NOTE — ED Provider Notes (Addendum)
[facility] DEPT Provider Note   CSN: [id number] Arrival date & time: 01/01/18  0518     History   Chief Complaint Chief Complaint  Patient presents with  . Abdominal Pain    HPI [name] [name] is a 73 y.o. female.  HPI Complains of crampy abdominal pain onset 4 days ago, constant.  Worse whenever she attempts to eat or drink.  Improved with holding a heart water bottle on the area.  She also complains of nausea she is treated herself with Zofran with some relief of nausea.  Pain feels like diverticulitis or colitis she is had in the past.  She denies any fever.  She had 5 bowel movements yesterday some of which were hard some more diarrhea.  She denies black stools or blood per rectum.  No urinary symptoms.  No other associated symptoms Past Medical History:  Diagnosis Date  . Alcohol abuse   . Anxiety    takes Valium daily as needed and Ativan daily  . Bilateral hearing loss   . Bipolar 1 disorder ([place])    takes Lithium nightly and Synthroid daily  . Chronic back pain    DDD; "all over" (09/14/2017)  . Colitis, ischemic ([place]) 2012  . Confusion    r/t meds  . Depression    takes Prozac daily and Bupspirone   . Diverticulosis   . Diverticulosis   . Dyslipidemia    takes Crestor daily  . Fibromyalgia   . Headache    "weekly" (09/14/2017)  . Hepatic steatosis 06/18/12   severe  . History of kidney stones   . Hyperlipidemia   . Hypertension   . Hypothyroidism   . IBS (irritable bowel syndrome)   . Ischemic colitis ([place])   . Joint pain   . Joint swelling   . Lupus erythematosus tumidus    tumid-skin  . Migraine    "1-2/yr; maybe" (09/14/2017)  . Numbness    in right foot  . Osteoarthritis    "all over" (09/14/2017)  . Osteoarthritis cervical spine   . Osteoarthritis of hand    bilateral  . Pneumonia    "walking pneumonia several times; long time since the last time" (09/14/2017)  . Restless leg syndrome    takes Requip nightly  .  Sciatica   . Type II diabetes mellitus ([place])   . Urinary frequency   . Urinary leakage   . Urinary urgency   . Urinary, incontinence, stress female   . Walking pneumonia    last time more than 43yrs ago    Patient Active Problem List   Diagnosis Date Noted  . Chest pain   . Chest pain with normal coronary angiography   . Diverticulitis 11/19/2016  . Hypothyroidism 07/14/2016  . Diarrhea   . Generalized abdominal pain   . Major depressive disorder, recurrent severe without psychotic features ([place]) 12/31/2015  . Nausea without vomiting   . Colitis 10/28/2015  . Unintentional poisoning by psychotropic drug 07/05/2015  . Restless leg syndrome 07/05/2015  . Diabetic polyneuropathy associated with type 2 diabetes mellitus ([place]) 09/21/2014  . Arthritis of left hip 06/09/2013  . Arthritis of right hip 06/08/2013  . Hip pain, right 05/02/2013  . IBS (irritable bowel syndrome) 01/02/2013  . Unspecified vitamin D deficiency 07/05/2012  . Pure hyperglyceridemia 07/04/2012  . Diabetes mellitus with diabetic neuropathy, without long-term current use of insulin ([place]) 06/18/2012  . Hypertension 06/18/2012  . Bipolar 1 disorder ([place]) 06/18/2012  . Low back pain  02/25/2011  . Acute ischemic colitis ([place]) 01/06/2011  . Sciatica 12/19/2010  . Fibromyalgia 10/28/2010  . Primary osteoarthritis of right hip 10/24/2010  . Hyperlipidemia with target low density lipoprotein (LDL) cholesterol less than 100 mg/dL 05/05/2010  . DEPRESSION/ANXIETY 05/05/2010  . ABUSE, ALCOHOL, IN REMISSION 05/05/2010  . OTITIS MEDIA, CHRONIC 05/05/2010  . Hearing loss 05/05/2010  . ALLERGIC RHINITIS, SEASONAL, MILD 05/05/2010  . URINARY INCONTINENCE, STRESS, FEMALE 05/05/2010  . OSTEOARTHRITIS, HANDS, BILATERAL 05/05/2010  . OSTEOARTHRITIS, CERVICAL SPINE 05/05/2010    Past Surgical History:  Procedure Laterality Date  . ABDOMINAL HYSTERECTOMY     "they left my ovaries"  . APPENDECTOMY    . CARDIAC  CATHETERIZATION  09/14/2017  . COLONOSCOPY    . DENTAL SURGERY Left 10/2016   dental implant  . DILATION AND CURETTAGE OF UTERUS  X 4  . ESOPHAGOGASTRODUODENOSCOPY    . FLEXIBLE SIGMOIDOSCOPY N/A 06/21/2012   Procedure: FLEXIBLE SIGMOIDOSCOPY;  Surgeon: Jerene Bears, MD;  Location: [facility];  Service: Gastroenterology;  Laterality: N/A;  . JOINT REPLACEMENT    . LEFT HEART CATH AND CORONARY ANGIOGRAPHY N/A 09/14/2017   Procedure: LEFT HEART CATH AND CORONARY ANGIOGRAPHY;  Surgeon: Belva Crome, MD;  Location: [facility];  Service: Cardiovascular;  Laterality: N/A;  . SHOULDER ARTHROSCOPY Right    "shaved spurs off rotator cuff"  . TONSILLECTOMY    . TOTAL HIP ARTHROPLASTY Right 06/09/2013   Procedure: TOTAL HIP ARTHROPLASTY;  Surgeon: Kerin Salen, MD;  Location: [facility];  Service: Orthopedics;  Laterality: Right;  . TUBAL LIGATION    . TUMOR EXCISION Right 1968   angle of jaw; benign     OB History   None      Home Medications    Prior to Admission medications   Medication Sig Start Date End Date Taking? Authorizing Provider  ALPRAZolam Duanne Moron) 0.5 MG tablet Take 2 mg by mouth 2 (two) times daily as needed for anxiety.    [provider]  atorvastatin (LIPITOR) 40 MG tablet Take 40 mg by mouth at bedtime.     [provider]  baclofen (LIORESAL) 10 MG tablet Take 20 mg by mouth 3 (three) times daily as needed for muscle spasms.    [provider]  BD PEN NEEDLE NANO U/F 32G X 4 MM MISC INJECT FIVE TIMES DAILY AS DIRECTED 06/17/17   Elayne Snare, MD  Biotin 1000 MCG tablet Take 2,000 mcg by mouth 3 (three) times daily.    [provider]  cholecalciferol (VITAMIN D) 1000 units tablet Take 2,000 Units by mouth daily.     [provider]  dicyclomine (BENTYL) 10 MG capsule Take 1 capsule (10 mg total) by mouth every 8 (eight) hours as needed for spasms. 12/29/16   Doran Stabler, MD  divalproex (DEPAKOTE ER) 250 MG 24 hr  tablet Take 2 tablets (500 mg total) by mouth at bedtime. 12/17/17   [name], PMHNP  donepezil (ARICEPT) 10 MG tablet Take 1 tablet at Bedtime 12/17/17   [name], PMHNP  gabapentin (NEURONTIN) 100 MG capsule Take 300 mg by mouth 3 (three) times daily.     [provider]  glucose blood (CONTOUR NEXT TEST) test strip Use to test blood sugar 2 times daily 12/21/16   Elayne Snare, MD  IBU 800 MG tablet Take 800 mg by mouth 3 (three) times daily as needed for pain. 02/10/17   [provider]  Icosapent Ethyl (VASCEPA) 1  g CAPS Take 2 capsules (2 g total) by mouth 2 (two) times daily. 10/13/17   Imogene Burn, PA-C  Lancets 30G MISC 1 each by Does not apply route daily. 09/13/17   Elayne Snare, MD  LANTUS SOLOSTAR 100 UNIT/ML Solostar Pen ADMINISTER 34 UNITS UNDER THE SKIN DAILY AT BEDTIME 11/29/17   Elayne Snare, MD  levothyroxine (SYNTHROID, LEVOTHROID) 75 MCG tablet Take 1 tablet (75 mcg total) by mouth daily before breakfast. I tablet daily before breakfast 08/18/17   Elayne Snare, MD  lithium carbonate (ESKALITH) 450 MG CR tablet Take 450 mg by mouth at bedtime.    [provider]  losartan (COZAAR) 50 MG tablet Take 25 mg by mouth daily.     [provider]  Melatonin 3 MG TABS Take 9 mg by mouth at bedtime.    [provider]  memantine (NAMENDA) 10 MG tablet Take 10 mg by mouth every evening.     [provider]  nitroGLYCERIN (NITROSTAT) 0.4 MG SL tablet Place 1 tablet (0.4 mg total) under the tongue every 5 (five) minutes as needed for chest pain. 09/14/17   Lyda Jester M, PA-C  ondansetron (ZOFRAN ODT) 8 MG disintegrating tablet Take 1 tablet (8 mg total) by mouth every 8 (eight) hours as needed for nausea or vomiting. 03/13/17   Dorie Rank, MD  OZEMPIC 0.25 or 0.5 MG/DOSE SOPN INJECT 0.5 MG INTO THE SKIN ONCE WEEKLY 05/25/17   Elayne Snare, MD  OZEMPIC 0.25 or 0.5 MG/DOSE SOPN INJECT 0.5 MG INTO THE SKIN ONCE WEEKLY 11/16/17    Elayne Snare, MD  polyethylene glycol [facility]) packet Take 17 g by mouth daily as needed. 11/20/16   Lavina Hamman, MD  QUEtiapine (SEROQUEL) 200 MG tablet Take 100 mg by mouth at 5 pm and 300 mg by mouth at bedtime    [provider]  rOPINIRole (REQUIP) 0.5 MG tablet Take 1.5 mg by mouth 2 (two) times daily. 1mg  at 5 PM and 2mg  at 7:30 PM    [provider]  traMADol (ULTRAM) 50 MG tablet Take 1 tablet (50 mg total) by mouth every 6 (six) hours as needed. 03/13/17   Dorie Rank, MD  zaleplon (SONATA) 5 MG capsule Take 1 capsule (5 mg total) by mouth at bedtime. 12/10/17   [name], PMHNP    Family History Family History  Problem Relation Age of Onset  . Drug abuse Mother   . Alcohol abuse Mother   . Alcohol abuse Father   . Hypertension Father   . CAD Brother   . Hypertension Brother   . Alcohol abuse Brother   . Hypertension Brother   . Hypertension Brother   . Psoriasis Daughter   . Arthritis Daughter        psoriatic arthritis     Social History Social History   Tobacco Use  . Smoking status: Never Smoker  . Smokeless tobacco: Never Used  Substance Use Topics  . Alcohol use: Not Currently    Comment: 09/14/2017 "nothing since 04/15/2008"  . Drug use: Never     Allergies   Codeine; Macrolides and ketolides; Procaine hcl; Aspirin; Benadryl [diphenhydramine]; Diflucan [fluconazole]; Dilaudid [hydromorphone hcl]; Other; and Sulfa antibiotics   Review of Systems Review of Systems  Constitutional: Negative.   HENT: Negative.   Respiratory: Negative.   Cardiovascular: Negative.   Gastrointestinal: Positive for abdominal distention, constipation, diarrhea and nausea.  Musculoskeletal: Negative.   Skin: Negative.   Allergic/Immunologic: Positive for immunocompromised state.  Diabetic  Neurological: Negative.   Psychiatric/Behavioral: Negative.   All other systems reviewed and are negative.    Physical Exam Updated Vital Signs BP (!)  145/81 (BP Location: Left Arm)   Pulse 88   Temp 98.1 F (36.7 C) (Oral)   Resp 18   Ht 5\' 2"  (1.575 m)   Wt 80.3 kg   SpO2 97%   BMI 32.37 kg/m   Physical Exam  Constitutional: She appears well-developed and well-nourished.  HENT:  Head: Normocephalic and atraumatic.  Eyes: Pupils are equal, round, and reactive to light. Conjunctivae are normal.  Neck: Neck supple. No tracheal deviation present. No thyromegaly present.  Cardiovascular: Normal rate and regular rhythm.  No murmur heard. Pulmonary/Chest: Effort normal and breath sounds normal.  Abdominal: Soft. Bowel sounds are normal. She exhibits no distension. There is tenderness.  Diffusely tender.  No guarding rigidity or rebound.  Tenderness most prominent in the lower abdomen  Musculoskeletal: Normal range of motion. She exhibits no edema or tenderness.  Neurological: She is alert. Coordination normal.  Skin: Skin is warm and dry. No rash noted.  Psychiatric: She has a normal mood and affect.  Nursing note and vitals reviewed.    ED Treatments / Results  Labs (all labs ordered are listed, but only abnormal results are displayed) Labs Reviewed  COMPREHENSIVE METABOLIC PANEL - Abnormal; Notable for the following components:      Result Value   Glucose, Bld 166 (*)    All other components within normal limits  LIPASE, BLOOD  CBC  URINALYSIS, ROUTINE W REFLEX MICROSCOPIC   Results for orders placed or performed during the hospital encounter of 01/01/18  Lipase, blood  Result Value Ref Range   Lipase 30 11 - 51 U/L  Comprehensive metabolic panel  Result Value Ref Range   Sodium 138 135 - 145 mmol/L   Potassium 3.7 3.5 - 5.1 mmol/L   Chloride 105 98 - 111 mmol/L   CO2 23 22 - 32 mmol/L   Glucose, Bld 166 (H) 70 - 99 mg/dL   BUN 9 8 - 23 mg/dL   Creatinine, Ser 0.86 0.44 - 1.00 mg/dL   Calcium 9.4 8.9 - 10.3 mg/dL   Total Protein 6.7 6.5 - 8.1 g/dL   Albumin 3.8 3.5 - 5.0 g/dL   AST 20 15 - 41 U/L   ALT 16 0 -  44 U/L   Alkaline Phosphatase 78 38 - 126 U/L   Total Bilirubin 0.6 0.3 - 1.2 mg/dL   GFR calc non Af Amer >60 >60 mL/min   GFR calc Af Amer >60 >60 mL/min   Anion gap 10 5 - 15  CBC  Result Value Ref Range   WBC 8.2 4.0 - 10.5 K/uL   RBC 4.48 3.87 - 5.11 MIL/uL   Hemoglobin 13.1 12.0 - 15.0 g/dL   HCT 42.4 36.0 - 46.0 %   MCV 94.6 80.0 - 100.0 fL   MCH 29.2 26.0 - 34.0 pg   MCHC 30.9 30.0 - 36.0 g/dL   RDW 13.2 11.5 - 15.5 %   Platelets 211 150 - 400 K/uL   nRBC 0.0 0.0 - 0.2 %  Urinalysis, Routine w reflex microscopic  Result Value Ref Range   Color, Urine YELLOW YELLOW   APPearance CLEAR CLEAR   Specific Gravity, Urine 1.011 1.005 - 1.030   pH 6.0 5.0 - 8.0   Glucose, UA NEGATIVE NEGATIVE mg/dL   Hgb urine dipstick NEGATIVE NEGATIVE   Bilirubin Urine NEGATIVE  NEGATIVE   Ketones, ur NEGATIVE NEGATIVE mg/dL   Protein, ur NEGATIVE NEGATIVE mg/dL   Nitrite NEGATIVE NEGATIVE   Leukocytes, UA TRACE (A) NEGATIVE   RBC / HPF 0-5 0 - 5 RBC/hpf   WBC, UA 11-20 0 - 5 WBC/hpf   Bacteria, UA NONE SEEN NONE SEEN   Squamous Epithelial / LPF 0-5 0 - 5   Mucus PRESENT   Valproic acid level  Result Value Ref Range   Valproic Acid Lvl 41 (L) 50.0 - 100.0 ug/mL  Lithium level  Result Value Ref Range   Lithium Lvl 0.90 0.60 - 1.20 mmol/L   Ct Abdomen Pelvis W Contrast  Result Date: 01/01/2018 CLINICAL DATA:  Lower abdominal pain for 4 days. EXAM: CT ABDOMEN AND PELVIS WITH CONTRAST TECHNIQUE: Multidetector CT imaging of the abdomen and pelvis was performed using the standard protocol following bolus administration of intravenous contrast. CONTRAST:  118mL ISOVUE-300 IOPAMIDOL (ISOVUE-300) INJECTION 61% COMPARISON:  CT scans 09/14/2017 FINDINGS: Lower chest: Chronic basilar interstitial lung disease. Unchanged 6 mm left lower lobe pulmonary nodule since 2017, considered benign. The heart is normal in size. No pericardial effusion. Hepatobiliary: No focal hepatic lesions or intrahepatic  biliary dilatation. The gallbladder is normal. No common bile duct dilatation. Pancreas: No mass, inflammation or ductal dilatation. Spleen: Normal size.  No focal lesions. Adrenals/Urinary Tract: The adrenal glands and kidneys are unremarkable and stable. The bladder appears normal. Stomach/Bowel: The stomach, duodenum and small bowel are grossly normal without oral contrast. No acute inflammatory changes, mass lesions or obstructive findings. Sigmoid colon diverticulosis along with probable mild changes of diverticulitis with submucosal edema, mucosal enhancement and mild pericolonic inflammatory changes. No complicating features. Vascular/Lymphatic: The aorta is normal in caliber. No dissection. The branch vessels are patent. The major venous structures are patent. No mesenteric or retroperitoneal mass or adenopathy. Small scattered lymph nodes are noted. Reproductive: The uterus is surgically absent. Both ovaries are still present and appear normal. Other: No pelvic mass or free pelvic fluid collections. No inguinal mass or adenopathy. Musculoskeletal: No significant bony findings. Intact right hip prosthesis. IMPRESSION: 1. Suspect mild uncomplicated sigmoid colon diverticulitis. 2. No other significant acute abdominal/pelvic findings. No mass or lymphadenopathy. Electronically Signed   By: Marijo Sanes M.D.   On: 01/01/2018 09:00   EKG None  Radiology No results found.  Procedures Procedures (including critical care time)  Medications Ordered in ED Medications  sodium chloride 0.9 % bolus 1,000 mL (has no administration in time range)  ondansetron (ZOFRAN) injection 4 mg (has no administration in time range)  morphine 4 MG/ML injection 4 mg (has no administration in time range)     Initial Impression / Assessment and Plan / ED Course  I have reviewed the triage vital signs and the nursing notes.  Pertinent labs & imaging results that were available during my care of the patient were  reviewed by me and considered in my medical decision making (see chart for details).     9:10 AM patient requesting more pain medicine.  Additional morphine ordered.  10 AM pain is under control.  I discussed with patient hospitalization for pain control versus discharge home.  She prefers to be discharged home.  She is requesting prescription for oxycodone.  She had prescription for tramadol written 12/20/2017 [facility] Controlled Substance reporting System queried.  She reports that tramadol does not control her pain well.  I will write prescription for 8 Percocet tablets.  Also prescription for Cipro Flagyl, and Zofran  as patient has run out follow-up with PMD if not improving by next week.  Return precautions given for fever, vomiting, intractable pain   Work remarkable for mild hyperglycemia otherwise normal. Final Clinical Impressions(s) / ED Diagnoses  Diagnoses #1 sigmoid diverticulitis Final diagnoses:  None  #2 hyperglycemia  ED Discharge Orders    None       Orlie Dakin, MD 01/01/18 1014    Orlie Dakin, MD 01/01/18 1018

## 2018-01-01 NOTE — ED Triage Notes (Signed)
Pt comes in with complaints of lower abdominal pain that began 4 days ago. Patient states hx of colitis and diverticulitis, states it feels the same. Patient denies vomiting but endorses nausea and states she is going between constipation and diarrhea. Patient denies blood in stools. Patient took zofran and stated that it helped with her nausea. Patient took bentyl for abd cramping and states that did not help.

## 2018-01-01 NOTE — Discharge Instructions (Signed)
Take Tylenol or tramadol for mild to moderate pain with the pain medicine prescribed for severe pain.  Call Dr. Orland Mustard if significant pain in 2 or 3 days.  Return to the emergency department for fever higher than 100.4, vomiting, or if pain not well controlled

## 2018-01-01 NOTE — ED Notes (Signed)
Pt requesting more pain meds. MD made aware.

## 2018-01-01 NOTE — ED Notes (Signed)
Pt ambulated to restroom without assistance.

## 2018-01-01 NOTE — ED Notes (Signed)
ED Provider at bedside. 

## 2018-01-03 DIAGNOSIS — K5792 Diverticulitis of intestine, part unspecified, without perforation or abscess without bleeding: Secondary | ICD-10-CM | POA: Diagnosis not present

## 2018-01-07 DIAGNOSIS — M25562 Pain in left knee: Secondary | ICD-10-CM | POA: Diagnosis not present

## 2018-01-11 ENCOUNTER — Encounter: Payer: Self-pay | Admitting: Psychiatry

## 2018-01-11 ENCOUNTER — Ambulatory Visit (INDEPENDENT_AMBULATORY_CARE_PROVIDER_SITE_OTHER): Payer: Medicare Other | Admitting: Psychiatry

## 2018-01-11 VITALS — BP 122/76 | HR 89

## 2018-01-11 DIAGNOSIS — F1011 Alcohol abuse, in remission: Secondary | ICD-10-CM | POA: Diagnosis not present

## 2018-01-11 DIAGNOSIS — F411 Generalized anxiety disorder: Secondary | ICD-10-CM | POA: Diagnosis not present

## 2018-01-11 DIAGNOSIS — F5101 Primary insomnia: Secondary | ICD-10-CM

## 2018-01-11 DIAGNOSIS — F319 Bipolar disorder, unspecified: Secondary | ICD-10-CM | POA: Diagnosis not present

## 2018-01-11 MED ORDER — ESCITALOPRAM OXALATE 5 MG PO TABS: 5.0000 mg | ORAL_TABLET | Freq: Every day | ORAL | 0 refills | Status: DC

## 2018-01-11 MED ORDER — DONEPEZIL HCL 10 MG PO TABS: ORAL_TABLET | ORAL | 1 refills | Status: DC

## 2018-01-11 MED ORDER — MEMANTINE HCL 10 MG PO TABS: 10.0000 mg | ORAL_TABLET | Freq: Every evening | ORAL | 1 refills | Status: DC

## 2018-01-11 MED ORDER — ZALEPLON 5 MG PO CAPS: 5.0000 mg | ORAL_CAPSULE | Freq: Every day | ORAL | 2 refills | Status: DC
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
## 2018-01-16 ENCOUNTER — Other Ambulatory Visit: Payer: Self-pay | Admitting: Endocrinology

## 2018-01-26 ENCOUNTER — Other Ambulatory Visit: Payer: Self-pay | Admitting: Endocrinology

## 2018-01-31 ENCOUNTER — Encounter: Payer: Self-pay | Admitting: Endocrinology

## 2018-01-31 ENCOUNTER — Ambulatory Visit (INDEPENDENT_AMBULATORY_CARE_PROVIDER_SITE_OTHER): Payer: Medicare Other | Admitting: Endocrinology

## 2018-01-31 VITALS — BP 118/80 | HR 84 | Ht 62.0 in | Wt 169.6 lb

## 2018-01-31 DIAGNOSIS — Z794 Long term (current) use of insulin: Secondary | ICD-10-CM

## 2018-01-31 DIAGNOSIS — E039 Hypothyroidism, unspecified: Secondary | ICD-10-CM

## 2018-01-31 DIAGNOSIS — E1165 Type 2 diabetes mellitus with hyperglycemia: Secondary | ICD-10-CM

## 2018-01-31 DIAGNOSIS — Z23 Encounter for immunization: Secondary | ICD-10-CM | POA: Diagnosis not present

## 2018-01-31 DIAGNOSIS — E782 Mixed hyperlipidemia: Secondary | ICD-10-CM

## 2018-01-31 NOTE — Patient Instructions (Addendum)
Check blood sugars on waking up 3  days a week  Also check blood sugars about 2 hours after meals and do this after different meals by rotation  Recommended blood sugar levels on waking up are 90-130 and about 2 hours after meal is 130-160  Please bring your blood sugar monitor to each visit, thank you  Take 1.0 not 0.5mg  dose of Ozempic  5 Humalog with desserts

## 2018-01-31 NOTE — Progress Notes (Signed)
Patient ID: [name] [name], female   DOB: [birth year], 72 y.o.   MRN: [id number]            Reason for Appointment: Follow-up for Type 2 Diabetes   History of Present Illness:          Date of diagnosis of type 2 diabetes mellitus: ?  2014        Background history:   She thinks her blood sugar was 400 at the time of diagnosis but no detailed records of this are available She did have an A1c of 10.6 done in 2014 and was probably given Lantus for some time initially Apparently she was treated with various medications including metformin, Janumet and Tradgenta. Her blood sugars had improved and A1c in 2015 was down to 6.2 She tends to have diarrhea with metformin and Janumet and also she thinks it causes dry mouth Most likely Janumet was stopped in 06/2015 Glipizide was started in 8/17 when blood sugars were higher and A1c was 9%  Recent history:   INSULIN regimen is:   Lantus 34 units daily at 5 pm  Humalog: Not taking now  Non-insulin hypoglycemic drugs the patient is taking are: Ozempic 0.5 mg weekly  Her A1c previously was down to 6.3 and now up to 7%  Current management, blood sugar patterns and problems identified:  She has been checking her blood sugars only sporadically in the mornings anywhere between 4 AM and 8 AM  She says that her blood sugars are dependent on her diet the night before  She had relatively good readings in the mornings last month but mostly higher recently  She is that she will eat some brownies late at night which will make her blood sugar go up  Has only one reading at about 10 PM at night which was mildly increased at 188  She has no side effects with Ozempic and takes it every Saturday  Her weight is down slightly from July   Side effects from medications have been:?  Diarrhea from metformin and Janumet  Compliance with the medical regimen: Inconsistent  Glucose monitoring:  done 1 time a day or less        Glucometer: Contour          Blood Glucose readings by time of day and averages from monitor download:   PRE-MEAL Fasting Lunch Dinner Bedtime Overall  Glucose range:  91-277      Mean/median:  151     160   POST-MEAL PC Breakfast PC Lunch PC Dinner  Glucose range:    188  Mean/median:       Self-care: The diet that the patient has been following IO:XBDZ, tries to limit drinks with sugar .      Typical meal intake: Breakfast at 9,  May be eggs and sausage and lunch is usually a sandwich   Dinner 6 pm               Dietician visit, most recent: 10/18               Exercise:  Minimal  Weight history:  Wt Readings from Last 3 Encounters:  01/31/18 169 lb 9.6 oz (76.9 kg)  01/01/18 177 lb (80.3 kg)  10/13/17 176 lb (79.8 kg)    Glycemic control:   Lab Results  Component Value Date   HGBA1C 7.0 (H) 12/21/2017   HGBA1C 6.3 09/17/2017   HGBA1C 6.6 (H) 06/18/2017   Lab Results  Component Value Date  MICROALBUR <0.7 07/10/2016   CREATININE 0.86 01/01/2018   Lab Results  Component Value Date   MICRALBCREAT 0.5 07/10/2016       Allergies as of 01/31/2018      Reactions   Codeine Anxiety, Other (See Comments)   Reaction:  Hallucinations   Macrolides And Ketolides Nausea And Vomiting   Procaine Hcl Palpitations   Aspirin Nausea And Vomiting   Reaction:  Burns pts stomach    Benadryl [diphenhydramine]    Per MD "inhibits potency of gabapentin, lithium etc"   Diflucan [fluconazole] Other (See Comments)   Reaction:  Unknown    Dilaudid [hydromorphone Hcl] Other (See Comments)   Reaction:  Migraines and nightmares    Other Nausea And Vomiting, Other (See Comments)   Pt states that all -mycins cause N/V.    Sulfa Antibiotics Other (See Comments)   Reaction:  Unknown       Medication List        Accurate as of 01/31/18  4:22 PM. Always use your most recent med list.          ALPRAZolam 0.5 MG tablet Commonly known as:  XANAX Take 0.5 mg by mouth 4 (four) times daily as needed for  anxiety.   atorvastatin 40 MG tablet Commonly known as:  LIPITOR Take 40 mg by mouth at bedtime.   baclofen 10 MG tablet Commonly known as:  LIORESAL Take 20 mg by mouth 3 (three) times daily as needed for muscle spasms.   BD PEN NEEDLE NANO U/F 32G X 4 MM Misc Generic drug:  Insulin Pen Needle USE TO INJECT FIVE TIMES DAILY AS DIRECTED   cholecalciferol 1000 units tablet Commonly known as:  VITAMIN D Take 2,000 Units by mouth daily.   dicyclomine 10 MG capsule Commonly known as:  BENTYL Take 1 capsule (10 mg total) by mouth every 8 (eight) hours as needed for spasms.   divalproex 250 MG 24 hr tablet Commonly known as:  DEPAKOTE ER Take 2 tablets (500 mg total) by mouth at bedtime.   donepezil 10 MG tablet Commonly known as:  ARICEPT Take 1 tablet at Bedtime   escitalopram 5 MG tablet Commonly known as:  LEXAPRO Take 1 tablet (5 mg total) by mouth daily for 15 doses.   gabapentin 100 MG capsule Commonly known as:  NEURONTIN Take 400 mg by mouth 3 (three) times daily.   glucose blood test strip Use to test blood sugar 2 times daily   IBU 800 MG tablet Generic drug:  ibuprofen Take 800 mg by mouth 3 (three) times daily as needed for pain.   Lancets 30G Misc 1 each by Does not apply route daily.   LANTUS SOLOSTAR 100 UNIT/ML Solostar Pen Generic drug:  Insulin Glargine ADMINISTER 34 UNITS UNDER THE SKIN DAILY AT BEDTIME   levothyroxine 75 MCG tablet Commonly known as:  SYNTHROID, LEVOTHROID Take 1 tablet (75 mcg total) by mouth daily before breakfast. I tablet daily before breakfast   lithium carbonate 450 MG CR tablet Commonly known as:  ESKALITH Take 450 mg by mouth at bedtime.   losartan 50 MG tablet Commonly known as:  COZAAR Take 25 mg by mouth daily.   Melatonin 3 MG Tabs Take 9 mg by mouth at bedtime.   memantine 10 MG tablet Commonly known as:  NAMENDA Take 1 tablet (10 mg total) by mouth every evening.   nitroGLYCERIN 0.4 MG SL  tablet Commonly known as:  NITROSTAT Place 1 tablet (0.4 mg total) under  the tongue every 5 (five) minutes as needed for chest pain.   ondansetron 8 MG disintegrating tablet Commonly known as:  ZOFRAN-ODT Take 1 tablet (8 mg total) by mouth every 8 (eight) hours as needed for nausea or vomiting.   ondansetron 8 MG tablet Commonly known as:  ZOFRAN Take 1 tablet (8 mg total) by mouth every 8 (eight) hours as needed for nausea.   OZEMPIC (0.25 OR 0.5 MG/DOSE) 2 MG/1.5ML Sopn Generic drug:  Semaglutide(0.25 or 0.5MG /DOS) INJECT 0.5 MG INTO THE SKIN ONCE WEEKLY   polyethylene glycol packet Commonly known as:  MIRALAX / GLYCOLAX Take 17 g by mouth daily as needed.   QUEtiapine 200 MG tablet Commonly known as:  SEROQUEL Take 100 mg by mouth at 5 pm and 300 mg by mouth at bedtime   rOPINIRole 0.5 MG tablet Commonly known as:  REQUIP Take 1.5 mg by mouth 2 (two) times daily. 1mg  at 5 PM and 2mg  at 7:30 PM   traMADol 50 MG tablet Commonly known as:  ULTRAM Take 1 tablet (50 mg total) by mouth every 6 (six) hours as needed.   zaleplon 5 MG capsule Commonly known as:  SONATA Take 1 capsule (5 mg total) by mouth at bedtime.       Allergies:  Allergies  Allergen Reactions  . Codeine Anxiety and Other (See Comments)    Reaction:  Hallucinations  . Macrolides And Ketolides Nausea And Vomiting  . Procaine Hcl Palpitations  . Aspirin Nausea And Vomiting    Reaction:  Burns pts stomach   . Benadryl [Diphenhydramine]     Per MD "inhibits potency of gabapentin, lithium etc"  . Diflucan [Fluconazole] Other (See Comments)    Reaction:  Unknown   . Dilaudid [Hydromorphone Hcl] Other (See Comments)    Reaction:  Migraines and nightmares   . Other Nausea And Vomiting and Other (See Comments)    Pt states that all -mycins cause N/V.   Marland Kitchen Sulfa Antibiotics Other (See Comments)    Reaction:  Unknown     Past Medical History:  Diagnosis Date  . Alcohol abuse   . Anxiety    takes  Valium daily as needed and Ativan daily  . Bilateral hearing loss   . Bipolar 1 disorder ([place])    takes Lithium nightly and Synthroid daily  . Chronic back pain    DDD; "all over" (09/14/2017)  . Colitis, ischemic ([place]) 2012  . Confusion    r/t meds  . Depression    takes Prozac daily and Bupspirone   . Diverticulosis   . Diverticulosis   . Dyslipidemia    takes Crestor daily  . Fibromyalgia   . Headache    "weekly" (09/14/2017)  . Hepatic steatosis 06/18/12   severe  . History of kidney stones   . Hyperlipidemia   . Hypertension   . Hypothyroidism   . IBS (irritable bowel syndrome)   . Ischemic colitis ([place])   . Joint pain   . Joint swelling   . Lupus erythematosus tumidus    tumid-skin  . Migraine    "1-2/yr; maybe" (09/14/2017)  . Numbness    in right foot  . Osteoarthritis    "all over" (09/14/2017)  . Osteoarthritis cervical spine   . Osteoarthritis of hand    bilateral  . Pneumonia    "walking pneumonia several times; long time since the last time" (09/14/2017)  . Restless leg syndrome    takes Requip nightly  . Sciatica   .  Type II diabetes mellitus ([place])   . Urinary frequency   . Urinary leakage   . Urinary urgency   . Urinary, incontinence, stress female   . Walking pneumonia    last time more than 77yrs ago    Past Surgical History:  Procedure Laterality Date  . ABDOMINAL HYSTERECTOMY     "they left my ovaries"  . APPENDECTOMY    . CARDIAC CATHETERIZATION  09/14/2017  . COLONOSCOPY    . DENTAL SURGERY Left 10/2016   dental implant  . DILATION AND CURETTAGE OF UTERUS  X 4  . ESOPHAGOGASTRODUODENOSCOPY    . FLEXIBLE SIGMOIDOSCOPY N/A 06/21/2012   Procedure: FLEXIBLE SIGMOIDOSCOPY;  Surgeon: Jerene Bears, MD;  Location: [facility];  Service: Gastroenterology;  Laterality: N/A;  . JOINT REPLACEMENT    . LEFT HEART CATH AND CORONARY ANGIOGRAPHY N/A 09/14/2017   Procedure: LEFT HEART CATH AND CORONARY ANGIOGRAPHY;  Surgeon: Belva Crome, MD;  Location: [facility];  Service: Cardiovascular;  Laterality: N/A;  . SHOULDER ARTHROSCOPY Right    "shaved spurs off rotator cuff"  . TONSILLECTOMY    . TOTAL HIP ARTHROPLASTY Right 06/09/2013   Procedure: TOTAL HIP ARTHROPLASTY;  Surgeon: Kerin Salen, MD;  Location: [facility];  Service: Orthopedics;  Laterality: Right;  . TUBAL LIGATION    . TUMOR EXCISION Right 1968   angle of jaw; benign    Family History  Problem Relation Age of Onset  . Drug abuse Mother   . Alcohol abuse Mother   . Alcohol abuse Father   . Hypertension Father   . CAD Brother   . Hypertension Brother   . Alcohol abuse Brother   . Hypertension Brother   . Hypertension Brother   . Psoriasis Daughter   . Arthritis Daughter        psoriatic arthritis   . Alcohol abuse Grandchild     Social History:  reports that she has never smoked. She has never used smokeless tobacco. She reports that she drank alcohol. She reports that she does not use drugs.   Review of Systems    Lipid history: On Lipitor 40 mg prescribed by her PCP Last LDL under 75  and this is now 146  Triglycerides  high and were relatively high in July even though she was fasting She is not always watching her diet On fish oil recently   Lab Results  Component Value Date   CHOL 249 (H) 09/17/2017   HDL 49.80 09/17/2017   LDLDIRECT 146.0 09/17/2017   TRIG (H) 09/17/2017    405.0 Triglyceride is over 400; calculations on Lipids are invalid.   CHOLHDL 5 09/17/2017            Most recent eye exam was In 6/18  Most recent foot exam:03/2016  THYROID: She has been on levothyroxine and Cytomel from her psychiatrist for several years with uncertain diagnosis Currently taking levothyroxine 75 g, also on Cytomel 25 g She says that she feels more energy at times when she is having her manic phase of her bipolar but occasionally will feel slowed and depressed No unusual fatigue recently  Also on lithium  Her TSH although again normal has been low  previously Free T3 and free T4 are low normal currently  Labs as follows:  Lab Results  Component Value Date   TSH 1.09 12/21/2017   TSH 3.86 03/22/2017   TSH 0.05 (L) 07/10/2016   FREET4 0.65 12/21/2017   FREET4 1.03 03/22/2017  FREET4 0.65 07/10/2016   Lab Results  Component Value Date   T3FREE 2.3 12/21/2017   T3FREE 3.0 03/22/2017   T3FREE 3.2 07/10/2016   T3FREE 2.7 04/30/2016    She has atypical depression on multiple drugs    Physical Examination:  BP 118/80 (BP Location: Left Arm, Patient Position: Sitting, Cuff Size: Normal)   Pulse 84   Ht 5\' 2"  (1.575 m)   Wt 169 lb 9.6 oz (76.9 kg)   SpO2 96%   BMI 31.02 kg/m          ASSESSMENT:  Diabetes type 2, on insulin  See history of present illness for detailed discussion of current diabetes management, blood sugar patterns and problems identified  She is on Ozempic, basal insulin only.  Her A1c is reasonably controlled at 7% although has been as low as 6.3  She has variable blood sugars but only high when she goes off her diet and eats desserts at night She is not doing much exercise either   LIPIDS: Currently being followed by PCP, unlikely to have good control of triglycerides without improvement in diet or prescription fenofibrate or Vascepa   Hypothyroidism, possibly secondary: Although her free T4 and T3 are normal she is not symptomatic and will continue the same regimen  Influenza vaccine given, explained to her that it is safe and important to have her protected with her diabetes and age  PLAN:  She will try to be more consistent with her diet If she is planning to eat dessert at night she will take 5 units of Humalog at that time Also if her diet is better and she has low normal readings in the morning she will need to reduce Lantus  More consistent monitoring of glucose at night To follow-up with PCP regarding lipids   There are no Patient Instructions on file for this visit.      Counseling time on subjects discussed in assessment and plan sections is over 50% of today's 25 minute visit    Elayne Snare 01/31/2018, 4:22 PM   Note: This office note was prepared with Dragon voice recognition system technology. Any transcriptional errors that result from this process are unintentional.

## 2018-02-02 DIAGNOSIS — M25562 Pain in left knee: Secondary | ICD-10-CM | POA: Diagnosis not present

## 2018-02-04 ENCOUNTER — Other Ambulatory Visit: Payer: Self-pay | Admitting: Endocrinology

## 2018-02-07 ENCOUNTER — Other Ambulatory Visit: Payer: Medicare Other

## 2018-02-10 ENCOUNTER — Ambulatory Visit: Payer: Medicare Other | Admitting: Psychiatry

## 2018-02-14 ENCOUNTER — Ambulatory Visit: Payer: Medicare Other | Admitting: Cardiovascular Disease

## 2018-02-14 ENCOUNTER — Telehealth: Payer: Self-pay | Admitting: Psychiatry

## 2018-02-14 DIAGNOSIS — R270 Ataxia, unspecified: Secondary | ICD-10-CM

## 2018-02-14 DIAGNOSIS — Z79899 Other long term (current) drug therapy: Secondary | ICD-10-CM

## 2018-02-14 NOTE — Telephone Encounter (Signed)
Just contacted pt and she wasn't aware of labs yet, instructed for her to have someone take her to get them drawn. Medications the same she went over the dosages with nurse. Reviewed from last office visit.

## 2018-02-14 NOTE — Telephone Encounter (Signed)
Patient called and reported to nurse that she almost went to the emergency room last night due to feeling "drunk" and running into walls for the past week.  Also reported trouble driving and has had increased drooling.  Will order several labs to rule out potential medical causes to include lithium level, valproic acid level, CMP, and ammonia level.  Will also request that nurse review current meds that she is taking to ensure patient is taking meds as directed since there have been no recent medication changes ordered.

## 2018-02-15 ENCOUNTER — Encounter: Payer: Self-pay | Admitting: Cardiovascular Disease

## 2018-02-15 LAB — COMPREHENSIVE METABOLIC PANEL
AG Ratio: 1.6 (calc) (ref 1.0–2.5)
ALT: 13 U/L (ref 6–29)
AST: 13 U/L (ref 10–35)
Albumin: 4.1 g/dL (ref 3.6–5.1)
Alkaline phosphatase (APISO): 80 U/L (ref 33–130)
BUN/Creatinine Ratio: 7 (calc) (ref 6–22)
BUN: 8 mg/dL (ref 7–25)
CO2: 28 mmol/L (ref 20–32)
Calcium: 10.4 mg/dL (ref 8.6–10.4)
Chloride: 104 mmol/L (ref 98–110)
Creat: 1.08 mg/dL — ABNORMAL HIGH (ref 0.60–0.93)
Globulin: 2.5 g/dL (calc) (ref 1.9–3.7)
Glucose, Bld: 148 mg/dL — ABNORMAL HIGH (ref 65–139)
Potassium: 4.3 mmol/L (ref 3.5–5.3)
Sodium: 139 mmol/L (ref 135–146)
Total Bilirubin: 0.5 mg/dL (ref 0.2–1.2)
Total Protein: 6.6 g/dL (ref 6.1–8.1)

## 2018-02-15 LAB — LITHIUM LEVEL: Lithium Lvl: 0.5 mmol/L — ABNORMAL LOW (ref 0.6–1.2)

## 2018-02-15 LAB — AMMONIA: Ammonia: 47 umol/L (ref <=72)

## 2018-02-15 LAB — VALPROIC ACID LEVEL: Valproic Acid Lvl: 53.3 mg/L (ref 50.0–100.0)

## 2018-02-24 ENCOUNTER — Ambulatory Visit: Payer: Medicare Other | Attending: Family Medicine | Admitting: Physical Therapy

## 2018-03-11 DIAGNOSIS — H524 Presbyopia: Secondary | ICD-10-CM | POA: Diagnosis not present

## 2018-03-11 DIAGNOSIS — H52223 Regular astigmatism, bilateral: Secondary | ICD-10-CM | POA: Diagnosis not present

## 2018-03-11 DIAGNOSIS — E119 Type 2 diabetes mellitus without complications: Secondary | ICD-10-CM | POA: Diagnosis not present

## 2018-03-11 DIAGNOSIS — H2513 Age-related nuclear cataract, bilateral: Secondary | ICD-10-CM | POA: Diagnosis not present

## 2018-03-11 DIAGNOSIS — H5203 Hypermetropia, bilateral: Secondary | ICD-10-CM | POA: Diagnosis not present

## 2018-03-12 ENCOUNTER — Other Ambulatory Visit: Payer: Self-pay | Admitting: Endocrinology

## 2018-03-13 ENCOUNTER — Encounter (HOSPITAL_COMMUNITY): Payer: Self-pay | Admitting: Emergency Medicine

## 2018-03-13 ENCOUNTER — Emergency Department (HOSPITAL_COMMUNITY)
Admission: EM | Admit: 2018-03-13 | Discharge: 2018-03-13 | Disposition: A | Discharge status: Home / Self Care | Payer: Medicare Other | Attending: Emergency Medicine | Admitting: Emergency Medicine

## 2018-03-13 ENCOUNTER — Other Ambulatory Visit: Payer: Self-pay

## 2018-03-13 DIAGNOSIS — T424X4A Poisoning by benzodiazepines, undetermined, initial encounter: Secondary | ICD-10-CM | POA: Diagnosis not present

## 2018-03-13 DIAGNOSIS — F319 Bipolar disorder, unspecified: Secondary | ICD-10-CM | POA: Diagnosis not present

## 2018-03-13 DIAGNOSIS — R42 Dizziness and giddiness: Secondary | ICD-10-CM | POA: Diagnosis not present

## 2018-03-13 DIAGNOSIS — I10 Essential (primary) hypertension: Secondary | ICD-10-CM | POA: Diagnosis not present

## 2018-03-13 DIAGNOSIS — Z96641 Presence of right artificial hip joint: Secondary | ICD-10-CM | POA: Insufficient documentation

## 2018-03-13 DIAGNOSIS — Z79899 Other long term (current) drug therapy: Secondary | ICD-10-CM | POA: Insufficient documentation

## 2018-03-13 DIAGNOSIS — E1142 Type 2 diabetes mellitus with diabetic polyneuropathy: Secondary | ICD-10-CM | POA: Insufficient documentation

## 2018-03-13 DIAGNOSIS — E039 Hypothyroidism, unspecified: Secondary | ICD-10-CM | POA: Insufficient documentation

## 2018-03-13 DIAGNOSIS — R51 Headache: Secondary | ICD-10-CM | POA: Diagnosis not present

## 2018-03-13 LAB — RAPID URINE DRUG SCREEN, HOSP PERFORMED
Amphetamines: NOT DETECTED
Barbiturates: NOT DETECTED
Benzodiazepines: POSITIVE — AB
Cocaine: NOT DETECTED
Opiates: NOT DETECTED
Tetrahydrocannabinol: NOT DETECTED

## 2018-03-13 LAB — CBC WITH DIFFERENTIAL/PLATELET
Abs Immature Granulocytes: 0.1 10*3/uL — ABNORMAL HIGH (ref 0.00–0.07)
Basophils Absolute: 0 10*3/uL (ref 0.0–0.1)
Basophils Relative: 0 %
Eosinophils Absolute: 0.3 10*3/uL (ref 0.0–0.5)
Eosinophils Relative: 4 %
HCT: 40.6 % (ref 36.0–46.0)
Hemoglobin: 12.6 g/dL (ref 12.0–15.0)
Immature Granulocytes: 1 %
Lymphocytes Relative: 26 %
Lymphs Abs: 2.4 10*3/uL (ref 0.7–4.0)
MCH: 29.4 pg (ref 26.0–34.0)
MCHC: 31 g/dL (ref 30.0–36.0)
MCV: 94.6 fL (ref 80.0–100.0)
Monocytes Absolute: 0.6 10*3/uL (ref 0.1–1.0)
Monocytes Relative: 6 %
Neutro Abs: 6 10*3/uL (ref 1.7–7.7)
Neutrophils Relative %: 63 %
Platelets: 199 10*3/uL (ref 150–400)
RBC: 4.29 MIL/uL (ref 3.87–5.11)
RDW: 13.2 % (ref 11.5–15.5)
WBC: 9.4 10*3/uL (ref 4.0–10.5)
nRBC: 0 % (ref 0.0–0.2)

## 2018-03-13 LAB — VALPROIC ACID LEVEL: Valproic Acid Lvl: 27 ug/mL — ABNORMAL LOW (ref 50.0–100.0)

## 2018-03-13 LAB — URINALYSIS, ROUTINE W REFLEX MICROSCOPIC
Bilirubin Urine: NEGATIVE
Glucose, UA: 150 mg/dL — AB
Hgb urine dipstick: NEGATIVE
Ketones, ur: NEGATIVE mg/dL
Leukocytes, UA: NEGATIVE
Nitrite: NEGATIVE
Protein, ur: NEGATIVE mg/dL
Specific Gravity, Urine: 1.005 (ref 1.005–1.030)
pH: 7 (ref 5.0–8.0)

## 2018-03-13 LAB — COMPREHENSIVE METABOLIC PANEL
ALT: 14 U/L (ref 0–44)
AST: 15 U/L (ref 15–41)
Albumin: 3.6 g/dL (ref 3.5–5.0)
Alkaline Phosphatase: 99 U/L (ref 38–126)
Anion gap: 10 (ref 5–15)
BUN: 10 mg/dL (ref 8–23)
CO2: 23 mmol/L (ref 22–32)
Calcium: 9.6 mg/dL (ref 8.9–10.3)
Chloride: 104 mmol/L (ref 98–111)
Creatinine, Ser: 0.9 mg/dL (ref 0.44–1.00)
GFR calc Af Amer: >60 mL/min (ref >60)
GFR calc non Af Amer: >60 mL/min (ref >60)
Glucose, Bld: 264 mg/dL — ABNORMAL HIGH (ref 70–99)
Potassium: 3.6 mmol/L (ref 3.5–5.1)
Sodium: 137 mmol/L (ref 135–145)
Total Bilirubin: 0.7 mg/dL (ref 0.3–1.2)
Total Protein: 6.1 g/dL — ABNORMAL LOW (ref 6.5–8.1)

## 2018-03-13 LAB — LITHIUM LEVEL: Lithium Lvl: 0.66 mmol/L (ref 0.60–1.20)

## 2018-03-13 LAB — SALICYLATE LEVEL: Salicylate Lvl: <7 mg/dL (ref 2.8–30.0)

## 2018-03-13 LAB — ETHANOL: Alcohol, Ethyl (B): <10 mg/dL (ref <10)

## 2018-03-13 LAB — ACETAMINOPHEN LEVEL: Acetaminophen (Tylenol), Serum: <10 ug/mL — ABNORMAL LOW (ref 10–30)

## 2018-03-13 LAB — TROPONIN I: Troponin I: <0.03 ng/mL (ref <0.03)

## 2018-03-13 MED ORDER — SODIUM CHLORIDE 0.9 % IV BOLUS
1000.0000 mL | Freq: Once | INTRAVENOUS | Status: AC
  Administered 2018-03-13: 1000 mL via INTRAVENOUS

## 2018-03-13 NOTE — ED Provider Notes (Signed)
[facility] DEPT Provider Note   CSN: [id number] Arrival date & time: 03/13/18  0543     History   Chief Complaint Chief Complaint  Patient presents with  . Drug Overdose    HPI [name] [name] is a 73 y.o. female.  Level 5 caveat for intoxication.  Patient here presents after ingestion of Xanax over the course the past 24 hours.  She states over the think I took too much".  States she is been taking 0.5 mg tablets every few hours for the past day.  She states this is an effort to sleep and she is not trying to hurt herself.  She is not slept for several days.  She estimates she took more than 10 tablets over the course of the past day, last dose at 5 AM.  Denies any other ingestions.  She does have a history of bipolar disorder and takes lithium as well as Depakote.  Denies any other alcohol use.  Denies any pain.  Denies any difficulty breathing or difficulty swallowing.  She denies any hallucinations or hearing any voices.  Denies any homicidal ideation.  Denies any chest pain or shortness of breath.  The history is provided by the patient. The history is limited by the condition of the patient.  Drug Overdose  Pertinent negatives include no chest pain, no abdominal pain, no headaches and no shortness of breath.    Past Medical History:  Diagnosis Date  . Alcohol abuse   . Anxiety    takes Valium daily as needed and Ativan daily  . Bilateral hearing loss   . Bipolar 1 disorder ([place])    takes Lithium nightly and Synthroid daily  . Chronic back pain    DDD; "all over" (09/14/2017)  . Colitis, ischemic ([place]) 2012  . Confusion    r/t meds  . Depression    takes Prozac daily and Bupspirone   . Diverticulosis   . Diverticulosis   . Dyslipidemia    takes Crestor daily  . Fibromyalgia   . Headache    "weekly" (09/14/2017)  . Hepatic steatosis 06/18/12   severe  . History of kidney stones   . Hyperlipidemia   . Hypertension   . Hypothyroidism     . IBS (irritable bowel syndrome)   . Ischemic colitis ([place])   . Joint pain   . Joint swelling   . Lupus erythematosus tumidus    tumid-skin  . Migraine    "1-2/yr; maybe" (09/14/2017)  . Numbness    in right foot  . Osteoarthritis    "all over" (09/14/2017)  . Osteoarthritis cervical spine   . Osteoarthritis of hand    bilateral  . Pneumonia    "walking pneumonia several times; long time since the last time" (09/14/2017)  . Restless leg syndrome    takes Requip nightly  . Sciatica   . Type II diabetes mellitus ([place])   . Urinary frequency   . Urinary leakage   . Urinary urgency   . Urinary, incontinence, stress female   . Walking pneumonia    last time more than 23yrs ago    Patient Active Problem List   Diagnosis Date Noted  . Chest pain   . Chest pain with normal coronary angiography   . Diverticulitis 11/19/2016  . Hypothyroidism 07/14/2016  . Diarrhea   . Generalized abdominal pain   . Major depressive disorder, recurrent severe without psychotic features ([place]) 12/31/2015  . Nausea without vomiting   . Colitis  10/28/2015  . Unintentional poisoning by psychotropic drug 07/05/2015  . Restless leg syndrome 07/05/2015  . Diabetic polyneuropathy associated with type 2 diabetes mellitus ([place]) 09/21/2014  . Arthritis of left hip 06/09/2013  . Arthritis of right hip 06/08/2013  . Hip pain, right 05/02/2013  . IBS (irritable bowel syndrome) 01/02/2013  . Unspecified vitamin D deficiency 07/05/2012  . Pure hyperglyceridemia 07/04/2012  . Diabetes mellitus with diabetic neuropathy, without long-term current use of insulin ([place]) 06/18/2012  . Hypertension 06/18/2012  . Bipolar 1 disorder ([place]) 06/18/2012  . Low back pain 02/25/2011  . Acute ischemic colitis ([place]) 01/06/2011  . Sciatica 12/19/2010  . Fibromyalgia 10/28/2010  . Primary osteoarthritis of right hip 10/24/2010  . Hyperlipidemia with target low density lipoprotein (LDL) cholesterol less than 100 mg/dL 05/05/2010   . DEPRESSION/ANXIETY 05/05/2010  . ABUSE, ALCOHOL, IN REMISSION 05/05/2010  . OTITIS MEDIA, CHRONIC 05/05/2010  . Hearing loss 05/05/2010  . ALLERGIC RHINITIS, SEASONAL, MILD 05/05/2010  . URINARY INCONTINENCE, STRESS, FEMALE 05/05/2010  . OSTEOARTHRITIS, HANDS, BILATERAL 05/05/2010  . OSTEOARTHRITIS, CERVICAL SPINE 05/05/2010    Past Surgical History:  Procedure Laterality Date  . ABDOMINAL HYSTERECTOMY     "they left my ovaries"  . APPENDECTOMY    . CARDIAC CATHETERIZATION  09/14/2017  . COLONOSCOPY    . DENTAL SURGERY Left 10/2016   dental implant  . DILATION AND CURETTAGE OF UTERUS  X 4  . ESOPHAGOGASTRODUODENOSCOPY    . FLEXIBLE SIGMOIDOSCOPY N/A 06/21/2012   Procedure: FLEXIBLE SIGMOIDOSCOPY;  Surgeon: Jerene Bears, MD;  Location: [facility];  Service: Gastroenterology;  Laterality: N/A;  . JOINT REPLACEMENT    . LEFT HEART CATH AND CORONARY ANGIOGRAPHY N/A 09/14/2017   Procedure: LEFT HEART CATH AND CORONARY ANGIOGRAPHY;  Surgeon: Belva Crome, MD;  Location: [facility];  Service: Cardiovascular;  Laterality: N/A;  . SHOULDER ARTHROSCOPY Right    "shaved spurs off rotator cuff"  . TONSILLECTOMY    . TOTAL HIP ARTHROPLASTY Right 06/09/2013   Procedure: TOTAL HIP ARTHROPLASTY;  Surgeon: Kerin Salen, MD;  Location: [facility];  Service: Orthopedics;  Laterality: Right;  . TUBAL LIGATION    . TUMOR EXCISION Right 1968   angle of jaw; benign     OB History   No obstetric history on file.      Home Medications    Prior to Admission medications   Medication Sig Start Date End Date Taking? Authorizing Provider  ALPRAZolam Duanne Moron) 0.5 MG tablet Take 0.5 mg by mouth 4 (four) times daily as needed for anxiety.     [provider]  atorvastatin (LIPITOR) 40 MG tablet Take 40 mg by mouth at bedtime.     [provider]  baclofen (LIORESAL) 10 MG tablet Take 20 mg by mouth 3 (three) times daily as needed for muscle spasms.    [provider]   BD PEN NEEDLE NANO U/F 32G X 4 MM MISC USE TO INJECT 5 TIMES DAILY AS DIRECTED 02/07/18   Elayne Snare, MD  cholecalciferol (VITAMIN D) 1000 units tablet Take 2,000 Units by mouth daily.     [provider]  dicyclomine (BENTYL) 10 MG capsule Take 1 capsule (10 mg total) by mouth every 8 (eight) hours as needed for spasms. 12/29/16   Doran Stabler, MD  divalproex (DEPAKOTE ER) 250 MG 24 hr tablet Take 2 tablets (500 mg total) by mouth at bedtime. 12/17/17   [name], PMHNP  donepezil (ARICEPT) 10 MG tablet  Take 1 tablet at Bedtime 01/11/18   [name], PMHNP  escitalopram (LEXAPRO) 5 MG tablet Take 1 tablet (5 mg total) by mouth daily for 15 doses. 01/11/18 01/26/18  [name], PMHNP  gabapentin (NEURONTIN) 100 MG capsule Take 400 mg by mouth 3 (three) times daily.     [provider]  glucose blood (CONTOUR NEXT TEST) test strip Use to test blood sugar 2 times daily 12/21/16   Elayne Snare, MD  IBU 800 MG tablet Take 800 mg by mouth 3 (three) times daily as needed for pain. 02/10/17   [provider]  Lancets 30G MISC 1 each by Does not apply route daily. 09/13/17   Elayne Snare, MD  LANTUS SOLOSTAR 100 UNIT/ML Solostar Pen ADMINISTER 34 UNITS UNDER THE SKIN DAILY AT BEDTIME 01/16/18   Elayne Snare, MD  levothyroxine (SYNTHROID, LEVOTHROID) 75 MCG tablet Take 1 tablet (75 mcg total) by mouth daily before breakfast. I tablet daily before breakfast 08/18/17   Elayne Snare, MD  lithium carbonate (ESKALITH) 450 MG CR tablet Take 450 mg by mouth at bedtime.    [provider]  losartan (COZAAR) 50 MG tablet Take 25 mg by mouth daily.     [provider]  Melatonin 3 MG TABS Take 9 mg by mouth at bedtime.    [provider]  memantine (NAMENDA) 10 MG tablet Take 1 tablet (10 mg total) by mouth every evening. 01/11/18 04/11/18  [name], PMHNP  nitroGLYCERIN (NITROSTAT) 0.4 MG SL tablet Place 1 tablet (0.4 mg total) under the tongue  every 5 (five) minutes as needed for chest pain. 09/14/17   Lyda Jester M, PA-C  ondansetron (ZOFRAN ODT) 8 MG disintegrating tablet Take 1 tablet (8 mg total) by mouth every 8 (eight) hours as needed for nausea or vomiting. 03/13/17   Dorie Rank, MD  ondansetron (ZOFRAN) 8 MG tablet Take 1 tablet (8 mg total) by mouth every 8 (eight) hours as needed for nausea. 01/01/18   Orlie Dakin, MD  OZEMPIC 0.25 or 0.5 MG/DOSE SOPN INJECT 0.5 MG INTO THE SKIN ONCE WEEKLY 05/25/17   Elayne Snare, MD  polyethylene glycol [facility]) packet Take 17 g by mouth daily as needed. 11/20/16   Lavina Hamman, MD  QUEtiapine (SEROQUEL) 200 MG tablet Take 100 mg by mouth at 5 pm and 300 mg by mouth at bedtime    [provider]  rOPINIRole (REQUIP) 0.5 MG tablet Take 1.5 mg by mouth 2 (two) times daily. 1mg  at 5 PM and 2mg  at 7:30 PM    [provider]  traMADol (ULTRAM) 50 MG tablet Take 1 tablet (50 mg total) by mouth every 6 (six) hours as needed. 03/13/17   Dorie Rank, MD  zaleplon (SONATA) 5 MG capsule Take 1 capsule (5 mg total) by mouth at bedtime. 01/11/18   [name], PMHNP    Family History Family History  Problem Relation Age of Onset  . Drug abuse Mother   . Alcohol abuse Mother   . Alcohol abuse Father   . Hypertension Father   . CAD Brother   . Hypertension Brother   . Alcohol abuse Brother   . Hypertension Brother   . Hypertension Brother   . Psoriasis Daughter   . Arthritis Daughter        psoriatic arthritis   . Alcohol abuse Grandchild     Social History Social History   Tobacco Use  . Smoking status: Never Smoker  . Smokeless tobacco: Never  Used  Substance Use Topics  . Alcohol use: Not Currently    Comment: 09/14/2017 "nothing since 04/15/2008"  . Drug use: Never     Allergies   Codeine; Macrolides and ketolides; Procaine hcl; Aspirin; Benadryl [diphenhydramine]; Diflucan [fluconazole]; Dilaudid [hydromorphone hcl]; Other; and Sulfa antibiotics   Review  of Systems Review of Systems  Constitutional: Negative for activity change, appetite change and fever.  HENT: Negative for congestion and rhinorrhea.   Respiratory: Negative for cough and shortness of breath.   Cardiovascular: Negative for chest pain.  Gastrointestinal: Negative for abdominal pain, nausea and vomiting.  Genitourinary: Negative for vaginal bleeding and vaginal discharge.  Musculoskeletal: Negative for arthralgias and myalgias.  Neurological: Positive for dizziness and light-headedness. Negative for seizures and headaches.    all other systems are negative except as noted in the HPI and PMH.    Physical Exam Updated Vital Signs BP (!) 141/78 (BP Location: Right Arm)   Pulse 95   Temp 97.6 F (36.4 C) (Oral)   Ht 5\' 3"  (1.6 m)   Wt 79.4 kg   SpO2 94%   BMI 31.00 kg/m   Physical Exam Vitals signs and nursing note reviewed.  Constitutional:      General: She is not in acute distress.    Appearance: She is well-developed.     Comments: Somnolent, slurred speech, protecting her airway  HENT:     Head: Normocephalic and atraumatic.     Mouth/Throat:     Pharynx: No oropharyngeal exudate.  Eyes:     Conjunctiva/sclera: Conjunctivae normal.     Pupils: Pupils are equal, round, and reactive to light.  Neck:     Musculoskeletal: Normal range of motion and neck supple.     Comments: No meningismus. Cardiovascular:     Rate and Rhythm: Normal rate and regular rhythm.     Heart sounds: Normal heart sounds. No murmur.  Pulmonary:     Effort: Pulmonary effort is normal. No respiratory distress.     Breath sounds: Normal breath sounds.  Abdominal:     Palpations: Abdomen is soft.     Tenderness: There is no abdominal tenderness. There is no guarding or rebound.  Musculoskeletal: Normal range of motion.        General: No tenderness.  Skin:    General: Skin is warm.     Capillary Refill: Capillary refill takes less than 2 seconds.  Neurological:     Mental  Status: She is alert and oriented to person, place, and time.     Cranial Nerves: No cranial nerve deficit.     Motor: No abnormal muscle tone.     Coordination: Coordination normal.     Comments: No ataxia on finger to nose bilaterally. No pronator drift. 5/5 strength throughout. CN 2-12 intact.Equal grip strength. Sensation intact.   Psychiatric:        Behavior: Behavior normal.      ED Treatments / Results  Labs (all labs ordered are listed, but only abnormal results are displayed) Labs Reviewed  CBC WITH DIFFERENTIAL/PLATELET - Abnormal; Notable for the following components:      Result Value   Abs Immature Granulocytes 0.10 (*)    All other components within normal limits  COMPREHENSIVE METABOLIC PANEL - Abnormal; Notable for the following components:   Glucose, Bld 264 (*)    Total Protein 6.1 (*)    All other components within normal limits  RAPID URINE DRUG SCREEN, HOSP PERFORMED - Abnormal; Notable for the following components:  Benzodiazepines POSITIVE (*)    All other components within normal limits  ACETAMINOPHEN LEVEL - Abnormal; Notable for the following components:   Acetaminophen (Tylenol), Serum <10 (*)    All other components within normal limits  URINALYSIS, ROUTINE W REFLEX MICROSCOPIC - Abnormal; Notable for the following components:   Color, Urine STRAW (*)    Glucose, UA 150 (*)    All other components within normal limits  VALPROIC ACID LEVEL - Abnormal; Notable for the following components:   Valproic Acid Lvl 27 (*)    All other components within normal limits  ETHANOL  SALICYLATE LEVEL  TROPONIN I  LITHIUM LEVEL    EKG EKG Interpretation  Date/Time:  Sunday March 13 2018 05:57:10 EST Ventricular Rate:  92 PR Interval:    QRS Duration: 91 QT Interval:  365 QTC Calculation: 452 R Axis:   25 Text Interpretation:  Sinus rhythm Low voltage, precordial leads Baseline wander in lead(s) V6 No significant change was found Confirmed by Ezequiel Essex [phone]) on 03/13/2018 6:14:44 AM   Radiology No results found.  Procedures Procedures (including critical care time)  Medications Ordered in ED Medications  sodium chloride 0.9 % bolus 1,000 mL (1,000 mLs Intravenous New Bag/Given 03/13/18 0618)     Initial Impression / Assessment and Plan / ED Course  I have reviewed the triage vital signs and the nursing notes.  Pertinent labs & imaging results that were available during my care of the patient were reviewed by me and considered in my medical decision making (see chart for details).    Patient is sleepy after ingesting multiple doses of Xanax throughout the past 24 hours.  Denies suicide attempts.  She is breathing on her own and not hypoxic.  She has slurred speech.  EKG unchanged.  Nasal cannula O2 for mild hypoxia.   Labs reassuring. UDS with benzos. Lithium level therapeutic. Depakote level subtherapeutic.  Will monitor for 4-6 hours since last dose of xanax per poison center recommendations which would be 9-11 am. Patient would then benefit from TTS consult as her intentions of taking the meds were not clear.  Dr. Tyrone Nine to assume care at shift change.  CRITICAL CARE Performed by: Ezequiel Essex Total critical care time: 101minutes Critical care time was exclusive of separately billable procedures and treating other patients. Critical care was necessary to treat or prevent imminent or life-threatening deterioration. Critical care was time spent personally by me on the following activities: development of treatment plan with patient and/or surrogate as well as nursing, discussions with consultants, evaluation of patient's response to treatment, examination of patient, obtaining history from patient or surrogate, ordering and performing treatments and interventions, ordering and review of laboratory studies, ordering and review of radiographic studies, pulse oximetry and re-evaluation of patient's condition.   Final  Clinical Impressions(s) / ED Diagnoses   Final diagnoses:  Benzodiazepine overdose of undetermined intent, initial encounter    ED Discharge Orders    None       Jackelin Correia, Annie Main, MD 03/13/18 [phone]

## 2018-03-13 NOTE — Progress Notes (Signed)
Patient ID: [name] [name], female   DOB: 1946/02/11, 73 y.o.   MRN: [id number]  Pt was seen by Dr Vanita Panda for possible IVC for transport to [facility]. This writer and Dr Vanita Panda consulted. Pt's sister is in the room with Pt and is going to spend the night with the Pt and take her to her appointment tomorrow at 1:00 PM with her psychiatrist at [facility]. This Probation officer spoke with the Pt and her sister and educated regarding taking more Xanax than prescribed and the rebound anxiety that this can cause, which may be contributing to her insomnia. Pt also was educated regarding the effects on memory that benzodiazepines can cause and encouraged to speak to her provider about possibly being weaned off of Xanax and finding another alternative. Pt's sister will take control of her sister's medications and make sure she has no access to them except for exactly what is prescribed. Pt is psychiatrically clear for discharge home under her sister's care and to follow up with her psychiatrist tomorrow. Pt's sister advised to bring Pt back to the emergency room or call 911 if her condition worsens.   Ethelene Hal  NP-C 03-13-2018          1850

## 2018-03-13 NOTE — Progress Notes (Signed)
03/13/2018  1850  Reviewed discharge instructions with patient and her sister. Both verbalized understanding of discharge instructions. Patient's sister states she will be giving her the medicine she needs as prescribed, she will be staying with her in her home tonight, and she will get her to her doctors appt tomorrow at 1pm.

## 2018-03-13 NOTE — ED Notes (Signed)
Patient denies pain and is resting comfortably.  
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
## 2018-03-13 NOTE — ED Notes (Signed)
Pt noted, [facility] Dr. Eulas Post @1300p .

## 2018-03-13 NOTE — ED Notes (Signed)
Denies SI, AVH: speak with pt without spouse present. Pt noted, "I am not suicidal, I just wasn't able to sleep. I took my meds like, I'm normally do. I just couldn't go to sleep." Her husband administers her medication. After taking her xanax around 5am, she felt she took too many and she ask to be brought to the hospital.   Writer ask, the spouse to come into the room. He is competent in her medications. We went over her medications to assist them with timing. Spouse and pt feels more comfortable with the timeframe discussed. Spouse noted, "we have been going through a lot." Pt noted, "my aunt passed away and we have to travel to funeral. My granddaughter just had surgery. 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 Encourage pt decrease the amount of caffeine in the evening and to try to complete all task before taking bedtime meds. Express to try not to any sleeping regimen after 2am.

## 2018-03-13 NOTE — ED Provider Notes (Signed)
Patient awake and alert. She offers some insight into her accidental overdose, describing difficulty with managing her medication. Patient is now accompanied by her sister, who corroborates this set up for the patient's medications. We had a lengthy conversation about the patient's presentation, recommendation for inpatient admission. Patient continues to deny suicidal ideation, and is awake, alert, some insight into her presentation. She states that she can follow-up with her psychiatrist at 1:00 tomorrow. After contracting with her, and her sister to ensure that she will see her own psychiatrist, and agreeing that if the psychiatrist recommends inpatient admission she will return here for placement, the patient was discharged with her sister, who will observe her until that meeting tomorrow.   Carmin Muskrat, MD 03/13/18 [place]

## 2018-03-13 NOTE — ED Triage Notes (Signed)
Pt reports taking 6 Xanax 0.5mg  at 0500 due to not being able to sleep. Pt states she was not trying to hurt self. Pt reports not being able to sleep for the last several days.

## 2018-03-13 NOTE — ED Notes (Signed)
[name] [name], spouse  [phone] please call him to inform him of any changes.

## 2018-03-13 NOTE — ED Notes (Signed)
Bed: WA33 Expected date:  Expected time:  Means of arrival:  Comments: 

## 2018-03-13 NOTE — Discharge Instructions (Addendum)
As discussed, it is very important that you follow-up with your psychiatrist tomorrow. Return here for any concerning changes.

## 2018-03-14 ENCOUNTER — Encounter: Payer: Self-pay | Admitting: Psychiatry

## 2018-03-14 ENCOUNTER — Ambulatory Visit (INDEPENDENT_AMBULATORY_CARE_PROVIDER_SITE_OTHER): Payer: Medicare Other | Admitting: Psychiatry

## 2018-03-14 VITALS — BP 117/74 | HR 101

## 2018-03-14 DIAGNOSIS — F319 Bipolar disorder, unspecified: Secondary | ICD-10-CM

## 2018-03-14 DIAGNOSIS — F5101 Primary insomnia: Secondary | ICD-10-CM | POA: Diagnosis not present

## 2018-03-14 DIAGNOSIS — F411 Generalized anxiety disorder: Secondary | ICD-10-CM | POA: Diagnosis not present

## 2018-03-14 MED ORDER — DIVALPROEX SODIUM ER 250 MG PO TB24: 500.0000 mg | ORAL_TABLET | Freq: Every day | ORAL | 1 refills | Status: DC
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
## 2018-03-25 DIAGNOSIS — N12 Tubulo-interstitial nephritis, not specified as acute or chronic: Secondary | ICD-10-CM | POA: Diagnosis not present

## 2018-03-28 DIAGNOSIS — M545 Low back pain: Secondary | ICD-10-CM | POA: Diagnosis not present

## 2018-03-28 DIAGNOSIS — R1084 Generalized abdominal pain: Secondary | ICD-10-CM | POA: Diagnosis not present

## 2018-03-28 DIAGNOSIS — Z79899 Other long term (current) drug therapy: Secondary | ICD-10-CM | POA: Diagnosis not present

## 2018-03-28 DIAGNOSIS — M5137 Other intervertebral disc degeneration, lumbosacral region: Secondary | ICD-10-CM | POA: Diagnosis not present

## 2018-03-29 ENCOUNTER — Telehealth: Payer: Self-pay | Admitting: Psychiatry

## 2018-03-29 NOTE — Telephone Encounter (Signed)
Pt given information and verbalized understanding.  

## 2018-03-29 NOTE — Telephone Encounter (Signed)
Pt's other doctor prescribed her Flexeril and Celebrex for a hurt back. Is it ok for her to take these with her current psych meds.?

## 2018-03-29 NOTE — Telephone Encounter (Signed)
Please advise 

## 2018-03-30 DIAGNOSIS — M533 Sacrococcygeal disorders, not elsewhere classified: Secondary | ICD-10-CM | POA: Diagnosis not present

## 2018-03-30 DIAGNOSIS — M545 Low back pain: Secondary | ICD-10-CM | POA: Diagnosis not present

## 2018-03-31 ENCOUNTER — Encounter: Payer: Self-pay | Admitting: Psychiatry

## 2018-03-31 ENCOUNTER — Ambulatory Visit (INDEPENDENT_AMBULATORY_CARE_PROVIDER_SITE_OTHER): Payer: Medicare Other | Admitting: Psychiatry

## 2018-03-31 VITALS — BP 107/69 | HR 88

## 2018-03-31 DIAGNOSIS — F5101 Primary insomnia: Secondary | ICD-10-CM

## 2018-03-31 DIAGNOSIS — F411 Generalized anxiety disorder: Secondary | ICD-10-CM | POA: Diagnosis not present

## 2018-03-31 DIAGNOSIS — F319 Bipolar disorder, unspecified: Secondary | ICD-10-CM
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
## 2018-04-04 ENCOUNTER — Other Ambulatory Visit: Payer: Self-pay

## 2018-04-04 MED ORDER — ROPINIROLE HCL 0.5 MG PO TABS: 1.5000 mg | ORAL_TABLET | Freq: Two times a day (BID) | ORAL | 1 refills | Status: DC

## 2018-04-05 DIAGNOSIS — G8929 Other chronic pain: Secondary | ICD-10-CM | POA: Diagnosis not present

## 2018-04-05 DIAGNOSIS — M545 Low back pain: Secondary | ICD-10-CM | POA: Diagnosis not present

## 2018-04-05 DIAGNOSIS — M533 Sacrococcygeal disorders, not elsewhere classified: Secondary | ICD-10-CM | POA: Diagnosis not present

## 2018-04-07 ENCOUNTER — Telehealth: Payer: Self-pay | Admitting: Endocrinology

## 2018-04-07 NOTE — Telephone Encounter (Signed)
Patient called re: Patient had a steroid shot Tuesday and now her blood sugar is 380. Please call patient at ph# [phone]. Per Olen Cordial, telephone message sent.

## 2018-04-08 NOTE — Telephone Encounter (Signed)
Please take an extra 16 units now, for a total of 50 today. Tomorrow and Sunday, take 50/d. Please call or message Dr Karma Lew, to tell us how the blood sugar is doing

## 2018-04-11 ENCOUNTER — Telehealth: Payer: Self-pay | Admitting: Endocrinology

## 2018-04-11 NOTE — Telephone Encounter (Signed)
Called pt to see if her blood sugars have been any better over the weekend and she stated that they haven't been as high as 380, but they have consistently been around 280. Pt could not give me recent exact blood sugar readings because she was driving at that time.

## 2018-04-11 NOTE — Telephone Encounter (Signed)
She needs to start taking Humalog if she has this otherwise we will send a prescription for 10 units before each meal and also increase her Lantus by 10 units until the morning sugars are below 150

## 2018-04-11 NOTE — Telephone Encounter (Signed)
Patient is returning a call from the office.   When patient called the first time She was placed on hold due to the nurse finishing up with a patient that Dr Dwyane Dee had in a room. She was Advised that we were going to put her on hold  And that the nurse would speak with her as soon as he could get finished up. When the nurse was ready to speak with the patient she had already hung up, she then called back very upset that she had to hold and that no one spoke with her. I advised her that when the nurse was back ready to talk and she had already hung up.   She is requesting a call back as soon as possible.

## 2018-04-11 NOTE — Telephone Encounter (Signed)
Please refer to Dr. Cordelia Pen advice below

## 2018-04-11 NOTE — Telephone Encounter (Signed)
Called pt and informed her of MD message. Pt verbalized understanding. 

## 2018-04-11 NOTE — Telephone Encounter (Signed)
Attempted to call pt. She did not answer. Voicemail left requesting a call back.

## 2018-04-14 ENCOUNTER — Encounter: Payer: Self-pay | Admitting: Psychiatry

## 2018-04-14 ENCOUNTER — Ambulatory Visit (INDEPENDENT_AMBULATORY_CARE_PROVIDER_SITE_OTHER): Payer: Medicare Other | Admitting: Psychiatry

## 2018-04-14 VITALS — BP 104/67 | HR 98

## 2018-04-14 DIAGNOSIS — F5101 Primary insomnia: Secondary | ICD-10-CM

## 2018-04-14 DIAGNOSIS — F319 Bipolar disorder, unspecified: Secondary | ICD-10-CM

## 2018-04-14 MED ORDER — ZALEPLON 5 MG PO CAPS: 5.0000 mg | ORAL_CAPSULE | Freq: Every day | ORAL | 2 refills | Status: DC
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
## 2018-04-16 ENCOUNTER — Other Ambulatory Visit: Payer: Self-pay | Admitting: Internal Medicine

## 2018-04-28 ENCOUNTER — Other Ambulatory Visit: Payer: Self-pay

## 2018-04-28 ENCOUNTER — Telehealth: Payer: Self-pay | Admitting: Endocrinology

## 2018-04-28 MED ORDER — GLUCOSE BLOOD VI STRP: ORAL_STRIP | 3 refills | Status: DC

## 2018-04-28 NOTE — Telephone Encounter (Signed)
glucose blood (CONTOUR NEXT TEST) test strip  Patient stated that the pharmacy told her this prescription was denied, and that she needed to call our office,.  Patient is completely out of her test strips and would like these sent asap.      [facility] [place], [place] AT [address]

## 2018-04-28 NOTE — Telephone Encounter (Signed)
Called pharmacy and inquired to whether the Rx was denied by the office or denied by insurance and pharmacy tech stated that it was denied by the office and reason cited was "refill not appropriate". Rx was sent again.

## 2018-05-03 DIAGNOSIS — H25013 Cortical age-related cataract, bilateral: Secondary | ICD-10-CM | POA: Diagnosis not present

## 2018-05-03 DIAGNOSIS — H2513 Age-related nuclear cataract, bilateral: Secondary | ICD-10-CM | POA: Diagnosis not present

## 2018-05-03 DIAGNOSIS — H18413 Arcus senilis, bilateral: Secondary | ICD-10-CM | POA: Diagnosis not present

## 2018-05-03 DIAGNOSIS — H25043 Posterior subcapsular polar age-related cataract, bilateral: Secondary | ICD-10-CM | POA: Diagnosis not present

## 2018-05-03 DIAGNOSIS — H2511 Age-related nuclear cataract, right eye: Secondary | ICD-10-CM | POA: Diagnosis not present

## 2018-05-03 DIAGNOSIS — H02831 Dermatochalasis of right upper eyelid: Secondary | ICD-10-CM | POA: Diagnosis not present

## 2018-05-04 ENCOUNTER — Other Ambulatory Visit (INDEPENDENT_AMBULATORY_CARE_PROVIDER_SITE_OTHER): Payer: Medicare Other

## 2018-05-04 DIAGNOSIS — E782 Mixed hyperlipidemia: Secondary | ICD-10-CM

## 2018-05-04 DIAGNOSIS — E1165 Type 2 diabetes mellitus with hyperglycemia: Secondary | ICD-10-CM

## 2018-05-04 DIAGNOSIS — Z794 Long term (current) use of insulin: Secondary | ICD-10-CM | POA: Diagnosis not present

## 2018-05-04 LAB — LIPID PANEL
Cholesterol: 178 mg/dL (ref 0–200)
HDL: 64.4 mg/dL (ref >39.00)
NonHDL: 113.49
Total CHOL/HDL Ratio: 3
Triglycerides: 240 mg/dL — ABNORMAL HIGH (ref 0.0–149.0)
VLDL: 48 mg/dL — ABNORMAL HIGH (ref 0.0–40.0)

## 2018-05-04 LAB — COMPREHENSIVE METABOLIC PANEL
ALT: 15 U/L (ref 0–35)
AST: 14 U/L (ref 0–37)
Albumin: 4.2 g/dL (ref 3.5–5.2)
Alkaline Phosphatase: 90 U/L (ref 39–117)
BUN: 11 mg/dL (ref 6–23)
CO2: 27 mEq/L (ref 19–32)
Calcium: 10.2 mg/dL (ref 8.4–10.5)
Chloride: 104 mEq/L (ref 96–112)
Creatinine, Ser: 0.92 mg/dL (ref 0.40–1.20)
GFR: 60.3 mL/min (ref >60.00)
Glucose, Bld: 122 mg/dL — ABNORMAL HIGH (ref 70–99)
Potassium: 4.6 mEq/L (ref 3.5–5.1)
Sodium: 139 mEq/L (ref 135–145)
Total Bilirubin: 0.4 mg/dL (ref 0.2–1.2)
Total Protein: 6.8 g/dL (ref 6.0–8.3)

## 2018-05-04 LAB — MICROALBUMIN / CREATININE URINE RATIO
Creatinine,U: 37.2 mg/dL
Microalb Creat Ratio: 1.9 mg/g (ref 0.0–30.0)
Microalb, Ur: <0.7 mg/dL (ref 0.0–1.9)

## 2018-05-04 LAB — HEMOGLOBIN A1C: Hgb A1c MFr Bld: 7.5 % — ABNORMAL HIGH (ref 4.6–6.5)

## 2018-05-04 LAB — LDL CHOLESTEROL, DIRECT: Direct LDL: 103 mg/dL

## 2018-05-05 ENCOUNTER — Ambulatory Visit: Payer: Medicare Other | Admitting: Psychiatry

## 2018-05-09 ENCOUNTER — Other Ambulatory Visit: Payer: Self-pay

## 2018-05-09 ENCOUNTER — Ambulatory Visit (INDEPENDENT_AMBULATORY_CARE_PROVIDER_SITE_OTHER): Payer: Medicare Other | Admitting: Endocrinology

## 2018-05-09 ENCOUNTER — Encounter: Payer: Self-pay | Admitting: Endocrinology

## 2018-05-09 VITALS — BP 124/84 | HR 95 | Ht 63.0 in | Wt 172.4 lb

## 2018-05-09 DIAGNOSIS — Z794 Long term (current) use of insulin: Secondary | ICD-10-CM | POA: Diagnosis not present

## 2018-05-09 DIAGNOSIS — E039 Hypothyroidism, unspecified: Secondary | ICD-10-CM | POA: Diagnosis not present

## 2018-05-09 DIAGNOSIS — E1165 Type 2 diabetes mellitus with hyperglycemia: Secondary | ICD-10-CM

## 2018-05-09 MED ORDER — SEMAGLUTIDE (1 MG/DOSE) 2 MG/1.5ML ~~LOC~~ SOPN: 1.0000 mg | PEN_INJECTOR | SUBCUTANEOUS | 3 refills | Status: DC

## 2018-05-09 NOTE — Patient Instructions (Addendum)
Check blood sugars on waking up 4 days a week  Also check blood sugars about 2 hours after meals and do this after different meals by rotation  Recommended blood sugar levels on waking up are 90-130 and about 2 hours after meal is 130-180  Please bring your blood sugar monitor to each visit, thank you  Water exercises

## 2018-05-09 NOTE — Progress Notes (Signed)
Patient ID: [name] [name], female   DOB: July 02, 1945, 73 y.o.   MRN: [id number]            Reason for Appointment: Follow-up for Type 2 Diabetes   History of Present Illness:          Date of diagnosis of type 2 diabetes mellitus: ?  2014        Background history:   She thinks her blood sugar was 400 at the time of diagnosis but no detailed records of this are available She did have an A1c of 10.6 done in 2014 and was probably given Lantus for some time initially Apparently she was treated with various medications including metformin, Janumet and Tradgenta. Her blood sugars had improved and A1c in 2015 was down to 6.2 She tends to have diarrhea with metformin and Janumet and also she thinks it causes dry mouth Most likely Janumet was stopped in 06/2015 Glipizide was started in 8/17 when blood sugars were higher and A1c was 9%  Recent history:   INSULIN regimen is:   Lantus 44 units daily at 5 pm  Humalog: taking 10 units 3 times daily  Non-insulin hypoglycemic drugs the patient is taking are: Ozempic 0.5 mg weekly  Her A1c previously was down to 6.3 and now up to 7.5%  Current management, blood sugar patterns and problems identified:  She had called about her blood sugars being much higher after getting steroid injections for her back and for the last month or so has been taking 10 units of Lantus along with additional 10 units of NovoLog with every meal  However blood sugars are still not well controlled  She is frequently eating snacks during the night causing her sugars to be higher  Otherwise blood sugars have been as low as 116  Despite taking higher doses of insulin and the last injection of steroid being in late January she still has overall high readings  However checking blood sugars mostly early morning or before breakfast and as before forgets to do readings after lunch and dinner  She said that she cannot comply with her diet with snacks and sweets despite  taking Ozempic  Very inactive lately also  Weight has not gone up significantly   Side effects from medications have been:?  Diarrhea from metformin and Janumet  Compliance with the medical regimen: Inconsistent  Glucose monitoring:  done 1 time a day or less        Glucometer: Contour        Blood Glucose readings by time of day and averages from monitor download:   PRE-MEAL Fasting Lunch Dinner  overnight Overall  Glucose range:  116-202    152-284   Mean/median:  153    199 177   POST-MEAL PC Breakfast PC Lunch PC Dinner  Glucose range:     Mean/median:       PREVIOUS readings:  PRE-MEAL Fasting Lunch Dinner Bedtime Overall  Glucose range:  91-277      Mean/median:  151     160   POST-MEAL PC Breakfast PC Lunch PC Dinner  Glucose range:    188  Mean/median:       Self-care: The diet that the patient has been following UX:LKGM, tries to limit drinks with sugar .      Typical meal intake: Breakfast at 9,  May be eggs and sausage and lunch is usually a sandwich   Dinner 7 pm  Dietician visit, most recent: 10/18               Exercise:  Minimal  Weight history:  Wt Readings from Last 3 Encounters:  05/09/18 172 lb 6.4 oz (78.2 kg)  03/13/18 175 lb (79.4 kg)  01/31/18 169 lb 9.6 oz (76.9 kg)    Glycemic control:   Lab Results  Component Value Date   HGBA1C 7.5 (H) 05/04/2018   HGBA1C 7.0 (H) 12/21/2017   HGBA1C 6.3 09/17/2017   Lab Results  Component Value Date   MICROALBUR <0.7 05/04/2018   CREATININE 0.92 05/04/2018   Lab Results  Component Value Date   MICRALBCREAT 1.9 05/04/2018       Allergies as of 05/09/2018      Reactions   Codeine Anxiety, Other (See Comments)   Reaction:  Hallucinations   Macrolides And Ketolides Nausea And Vomiting   Procaine Hcl Palpitations   Aspirin Nausea And Vomiting   Reaction:  Burns pts stomach    Benadryl [diphenhydramine]    Per MD "inhibits potency of gabapentin, lithium etc"   Diflucan  [fluconazole] Other (See Comments)   Reaction:  Unknown    Dilaudid [hydromorphone Hcl] Other (See Comments)   Reaction:  Migraines and nightmares    Other Nausea And Vomiting, Other (See Comments)   Pt states that all -mycins cause N/V.    Sulfa Antibiotics Other (See Comments)   Reaction:  Unknown       Medication List       Accurate as of May 09, 2018 11:59 PM. Always use your most recent med list.        ALPRAZolam 0.5 MG tablet Commonly known as:  XANAX Take 0.5 mg by mouth 4 (four) times daily as needed for anxiety.   atorvastatin 40 MG tablet Commonly known as:  LIPITOR Take 40 mg by mouth at bedtime.   baclofen 10 MG tablet Commonly known as:  LIORESAL Take 20 mg by mouth 3 (three) times daily as needed for muscle spasms.   BD PEN NEEDLE NANO U/F 32G X 4 MM Misc Generic drug:  Insulin Pen Needle USE TO INJECT 5 TIMES DAILY AS DIRECTED   cholecalciferol 1000 units tablet Commonly known as:  VITAMIN D Take 2,000 Units by mouth daily.   cyclobenzaprine 5 MG tablet Commonly known as:  FLEXERIL Take 5 mg by mouth 3 (three) times daily as needed for muscle spasms.   dicyclomine 10 MG capsule Commonly known as:  BENTYL Take 1 capsule (10 mg total) by mouth every 8 (eight) hours as needed for spasms.   divalproex 250 MG 24 hr tablet Commonly known as:  DEPAKOTE ER Take 2 tablets (500 mg total) by mouth at bedtime.   donepezil 10 MG tablet Commonly known as:  ARICEPT Take 1 tablet at Bedtime   gabapentin 400 MG capsule Commonly known as:  NEURONTIN Take 400 mg by mouth 3 (three) times daily.   glucose blood test strip Commonly known as:  CONTOUR NEXT TEST Use to test blood sugar 2 times daily   HUMALOG KWIKPEN 100 UNIT/ML KwikPen Generic drug:  insulin lispro Inject 10 Units into the skin 3 (three) times daily. INJECT 10 UNITS UNDER THE SKIN THREE TIMES DAILY BEFORE MEALS.   IBU 800 MG tablet Generic drug:  ibuprofen Take 800 mg by mouth 3 (three)  times daily as needed for pain.   Lancets 30G Misc 1 each by Does not apply route daily.   LANTUS SOLOSTAR 100 UNIT/ML  Solostar Pen Generic drug:  Insulin Glargine Inject 44 Units into the skin daily. INJECT 44 UNITS UNDER THE SKIN ONCE DAILY AT BEDTIME.   levothyroxine 75 MCG tablet Commonly known as:  SYNTHROID, LEVOTHROID Take 1 tablet (75 mcg total) by mouth daily before breakfast. I tablet daily before breakfast   lithium carbonate 450 MG CR tablet Commonly known as:  ESKALITH Take 450 mg by mouth at bedtime.   losartan 50 MG tablet Commonly known as:  COZAAR Take 25 mg by mouth daily.   Melatonin 3 MG Tabs Take 9 mg by mouth at bedtime.   memantine 10 MG tablet Commonly known as:  NAMENDA Take 1 tablet (10 mg total) by mouth every evening.   nitroGLYCERIN 0.4 MG SL tablet Commonly known as:  NITROSTAT Place 1 tablet (0.4 mg total) under the tongue every 5 (five) minutes as needed for chest pain.   omega-3 acid ethyl esters 1 g capsule Commonly known as:  LOVAZA Take 1 g by mouth 2 (two) times daily.   ondansetron 8 MG tablet Commonly known as:  ZOFRAN Take 1 tablet (8 mg total) by mouth every 8 (eight) hours as needed for nausea.   polyethylene glycol packet Commonly known as:  MIRALAX Take 17 g by mouth daily as needed.   QUEtiapine 200 MG tablet Commonly known as:  SEROQUEL Take 200 mg by mouth 2 (two) times daily. Take 100 mg by mouth at 5 pm and 300 mg by mouth at bedtime   rOPINIRole 0.5 MG tablet Commonly known as:  REQUIP Take 3 tablets (1.5 mg total) by mouth 2 (two) times daily.   Semaglutide (1 MG/DOSE) 2 MG/1.5ML Sopn Commonly known as:  OZEMPIC (1 MG/DOSE) Inject 1 mg into the skin once a week.   traMADol 50 MG tablet Commonly known as:  ULTRAM Take 1 tablet (50 mg total) by mouth every 6 (six) hours as needed.   zaleplon 5 MG capsule Commonly known as:  SONATA Take 1 capsule (5 mg total) by mouth at bedtime.       Allergies:    Allergies  Allergen Reactions  . Codeine Anxiety and Other (See Comments)    Reaction:  Hallucinations  . Macrolides And Ketolides Nausea And Vomiting  . Procaine Hcl Palpitations  . Aspirin Nausea And Vomiting    Reaction:  Burns pts stomach   . Benadryl [Diphenhydramine]     Per MD "inhibits potency of gabapentin, lithium etc"  . Diflucan [Fluconazole] Other (See Comments)    Reaction:  Unknown   . Dilaudid [Hydromorphone Hcl] Other (See Comments)    Reaction:  Migraines and nightmares   . Other Nausea And Vomiting and Other (See Comments)    Pt states that all -mycins cause N/V.   Marland Kitchen Sulfa Antibiotics Other (See Comments)    Reaction:  Unknown     Past Medical History:  Diagnosis Date  . Alcohol abuse   . Anxiety    takes Valium daily as needed and Ativan daily  . Bilateral hearing loss   . Bipolar 1 disorder ([place])    takes Lithium nightly and Synthroid daily  . Chronic back pain    DDD; "all over" (09/14/2017)  . Colitis, ischemic ([place]) 2012  . Confusion    r/t meds  . Depression    takes Prozac daily and Bupspirone   . Diverticulosis   . Diverticulosis   . Dyslipidemia    takes Crestor daily  . Fibromyalgia   . Headache    "  weekly" (09/14/2017)  . Hepatic steatosis 06/18/12   severe  . History of kidney stones   . Hyperlipidemia   . Hypertension   . Hypothyroidism   . IBS (irritable bowel syndrome)   . Ischemic colitis ([place])   . Joint pain   . Joint swelling   . Lupus erythematosus tumidus    tumid-skin  . Migraine    "1-2/yr; maybe" (09/14/2017)  . Numbness    in right foot  . Osteoarthritis    "all over" (09/14/2017)  . Osteoarthritis cervical spine   . Osteoarthritis of hand    bilateral  . Pneumonia    "walking pneumonia several times; long time since the last time" (09/14/2017)  . Restless leg syndrome    takes Requip nightly  . Sciatica   . Type II diabetes mellitus ([place])   . Urinary frequency   . Urinary leakage   . Urinary urgency   .  Urinary, incontinence, stress female   . Walking pneumonia    last time more than 75yrs ago    Past Surgical History:  Procedure Laterality Date  . ABDOMINAL HYSTERECTOMY     "they left my ovaries"  . APPENDECTOMY    . CARDIAC CATHETERIZATION  09/14/2017  . COLONOSCOPY    . DENTAL SURGERY Left 10/2016   dental implant  . DILATION AND CURETTAGE OF UTERUS  X 4  . ESOPHAGOGASTRODUODENOSCOPY    . FLEXIBLE SIGMOIDOSCOPY N/A 06/21/2012   Procedure: FLEXIBLE SIGMOIDOSCOPY;  Surgeon: Jerene Bears, MD;  Location: [facility];  Service: Gastroenterology;  Laterality: N/A;  . JOINT REPLACEMENT    . LEFT HEART CATH AND CORONARY ANGIOGRAPHY N/A 09/14/2017   Procedure: LEFT HEART CATH AND CORONARY ANGIOGRAPHY;  Surgeon: Belva Crome, MD;  Location: [facility];  Service: Cardiovascular;  Laterality: N/A;  . SHOULDER ARTHROSCOPY Right    "shaved spurs off rotator cuff"  . TONSILLECTOMY    . TOTAL HIP ARTHROPLASTY Right 06/09/2013   Procedure: TOTAL HIP ARTHROPLASTY;  Surgeon: Kerin Salen, MD;  Location: [facility];  Service: Orthopedics;  Laterality: Right;  . TUBAL LIGATION    . TUMOR EXCISION Right 1968   angle of jaw; benign    Family History  Problem Relation Age of Onset  . Drug abuse Mother   . Alcohol abuse Mother   . Alcohol abuse Father   . Hypertension Father   . CAD Brother   . Hypertension Brother   . Alcohol abuse Brother   . Hypertension Brother   . Hypertension Brother   . Psoriasis Daughter   . Arthritis Daughter        psoriatic arthritis   . Alcohol abuse Grandchild     Social History:  reports that she has never smoked. She has never used smokeless tobacco. She reports previous alcohol use. She reports current drug use. Frequency: 7.00 times per week. Drug: Benzodiazepines.   Review of Systems    Lipid history: On Lipitor 40 mg prescribed by her PCP Last LDL was high and now just above 100  Triglycerides  high and were relatively high but now relatively  better She is not always watching her diet On fish oil ? 2 daily   Lab Results  Component Value Date   CHOL 178 05/04/2018   HDL 64.40 05/04/2018   LDLDIRECT 103.0 05/04/2018   TRIG 240.0 (H) 05/04/2018   CHOLHDL 3 05/04/2018            Most recent eye exam was  In 6/18  Most recent foot exam:03/2016  THYROID: She has been on levothyroxine and Cytomel from her psychiatrist for several years with uncertain diagnosis Currently taking levothyroxine 75 g, also on Cytomel 25 g She says that she feels more energy at times when she is having her manic phase of her bipolar but occasionally will feel slowed and depressed No unusual fatigue   Also on lithium  Her TSH although again normal has been low previously Free T3 and free T4 are low normal on the last labs  Labs as follows:  Lab Results  Component Value Date   TSH 1.09 12/21/2017   TSH 3.86 03/22/2017   TSH 0.05 (L) 07/10/2016   FREET4 0.65 12/21/2017   FREET4 1.03 03/22/2017   FREET4 0.65 07/10/2016   Lab Results  Component Value Date   T3FREE 2.3 12/21/2017   T3FREE 3.0 03/22/2017   T3FREE 3.2 07/10/2016   T3FREE 2.7 04/30/2016    She has atypical depression on multiple drugs, has been on Seroquel for quite some time    Physical Examination:  BP 124/84 (BP Location: Left Arm, Patient Position: Sitting, Cuff Size: Normal)   Pulse 95   Ht 5\' 3"  (1.6 m)   Wt 172 lb 6.4 oz (78.2 kg)   SpO2 96%   BMI 30.54 kg/m          ASSESSMENT:  Diabetes type 2, on insulin  See history of present illness for detailed discussion of current diabetes management, blood sugar patterns and problems identified  She is on Ozempic, basal and bolus insulin.  Her A1c is getting progressively higher and now 7.5, has been previously as low as 6.3 Partly because of getting steroid injections in her back her blood sugars have been higher She is requiring more insulin Also has difficulty with complying with diet especially  snacks late at night as before Has difficulty losing weight   Hypothyroidism, possibly secondary: Will need follow-up labs on the next visit    PLAN:  She needs to check some readings after all her meals especially supper Continue insulin unchanged However explained to her that it is difficult to adjust her insulin doses if she is not checking her readings after meals during the day Ozempic will be increased up to 1 mg to help postprandial hyperglycemia and to hopefully reduce her carbohydrate cravings To call if she has any difficulty with GI side effects from this  When she is able to she should start exercises as water aerobics Discussed blood sugar targets fasting and after meals Follow-up in 2 months   Patient Instructions  Check blood sugars on waking up 4 days a week  Also check blood sugars about 2 hours after meals and do this after different meals by rotation  Recommended blood sugar levels on waking up are 90-130 and about 2 hours after meal is 130-180  Please bring your blood sugar monitor to each visit, thank you  Water exercises          Counseling time on subjects discussed in assessment and plan sections is over 50% of today's 25 minute visit        Elayne Snare 05/10/2018, 11:01 AM   Note: This office note was prepared with Dragon voice recognition system technology. Any transcriptional errors that result from this process are unintentional.

## 2018-05-18 ENCOUNTER — Other Ambulatory Visit: Payer: Self-pay

## 2018-05-18 ENCOUNTER — Ambulatory Visit (INDEPENDENT_AMBULATORY_CARE_PROVIDER_SITE_OTHER): Payer: Medicare Other | Admitting: Psychiatry

## 2018-05-18 ENCOUNTER — Encounter: Payer: Self-pay | Admitting: Psychiatry

## 2018-05-18 VITALS — BP 109/68 | HR 82

## 2018-05-18 DIAGNOSIS — F5101 Primary insomnia: Secondary | ICD-10-CM | POA: Diagnosis not present

## 2018-05-18 DIAGNOSIS — F319 Bipolar disorder, unspecified: Secondary | ICD-10-CM

## 2018-05-18 DIAGNOSIS — F411 Generalized anxiety disorder: Secondary | ICD-10-CM | POA: Diagnosis not present

## 2018-05-18 DIAGNOSIS — G2581 Restless legs syndrome: Secondary | ICD-10-CM

## 2018-05-18 MED ORDER — ALPRAZOLAM 0.5 MG PO TABS: 0.5000 mg | ORAL_TABLET | Freq: Four times a day (QID) | ORAL | 2 refills | Status: DC | PRN

## 2018-05-18 MED ORDER — QUETIAPINE FUMARATE 100 MG PO TABS: ORAL_TABLET | ORAL | 0 refills | Status: DC

## 2018-05-18 MED ORDER — ROPINIROLE HCL 0.5 MG PO TABS: 1.5000 mg | ORAL_TABLET | Freq: Two times a day (BID) | ORAL | 1 refills | Status: DC
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
## 2018-05-19 ENCOUNTER — Encounter (HOSPITAL_COMMUNITY): Payer: Self-pay

## 2018-05-19 ENCOUNTER — Emergency Department (HOSPITAL_COMMUNITY)
Admission: EM | Admit: 2018-05-19 | Discharge: 2018-05-19 | Disposition: A | Discharge status: Home / Self Care | Payer: Medicare Other | Attending: Emergency Medicine | Admitting: Emergency Medicine

## 2018-05-19 ENCOUNTER — Other Ambulatory Visit: Payer: Self-pay

## 2018-05-19 ENCOUNTER — Emergency Department (HOSPITAL_COMMUNITY): Payer: Medicare Other

## 2018-05-19 DIAGNOSIS — Z96641 Presence of right artificial hip joint: Secondary | ICD-10-CM | POA: Insufficient documentation

## 2018-05-19 DIAGNOSIS — I10 Essential (primary) hypertension: Secondary | ICD-10-CM | POA: Diagnosis not present

## 2018-05-19 DIAGNOSIS — E119 Type 2 diabetes mellitus without complications: Secondary | ICD-10-CM | POA: Insufficient documentation

## 2018-05-19 DIAGNOSIS — Z794 Long term (current) use of insulin: Secondary | ICD-10-CM | POA: Diagnosis not present

## 2018-05-19 DIAGNOSIS — K5792 Diverticulitis of intestine, part unspecified, without perforation or abscess without bleeding: Secondary | ICD-10-CM

## 2018-05-19 DIAGNOSIS — K5730 Diverticulosis of large intestine without perforation or abscess without bleeding: Secondary | ICD-10-CM | POA: Diagnosis not present

## 2018-05-19 DIAGNOSIS — E039 Hypothyroidism, unspecified: Secondary | ICD-10-CM | POA: Diagnosis not present

## 2018-05-19 DIAGNOSIS — Z79899 Other long term (current) drug therapy: Secondary | ICD-10-CM | POA: Insufficient documentation

## 2018-05-19 DIAGNOSIS — K5732 Diverticulitis of large intestine without perforation or abscess without bleeding: Secondary | ICD-10-CM | POA: Insufficient documentation

## 2018-05-19 DIAGNOSIS — R110 Nausea: Secondary | ICD-10-CM | POA: Diagnosis not present

## 2018-05-19 DIAGNOSIS — R1032 Left lower quadrant pain: Secondary | ICD-10-CM | POA: Diagnosis not present

## 2018-05-19 LAB — URINALYSIS, ROUTINE W REFLEX MICROSCOPIC
Bilirubin Urine: NEGATIVE
Glucose, UA: NEGATIVE mg/dL
Hgb urine dipstick: NEGATIVE
Ketones, ur: NEGATIVE mg/dL
Leukocytes,Ua: NEGATIVE
Nitrite: NEGATIVE
Protein, ur: NEGATIVE mg/dL
Specific Gravity, Urine: 1.006 (ref 1.005–1.030)
pH: 7 (ref 5.0–8.0)

## 2018-05-19 LAB — DIFFERENTIAL
Abs Immature Granulocytes: 0.13 10*3/uL — ABNORMAL HIGH (ref 0.00–0.07)
Basophils Absolute: 0 10*3/uL (ref 0.0–0.1)
Basophils Relative: 0 %
Eosinophils Absolute: 0.2 10*3/uL (ref 0.0–0.5)
Eosinophils Relative: 2 %
Immature Granulocytes: 2 %
Lymphocytes Relative: 18 %
Lymphs Abs: 1.6 10*3/uL (ref 0.7–4.0)
Monocytes Absolute: 0.5 10*3/uL (ref 0.1–1.0)
Monocytes Relative: 6 %
Neutro Abs: 6.4 10*3/uL (ref 1.7–7.7)
Neutrophils Relative %: 72 %

## 2018-05-19 LAB — COMPREHENSIVE METABOLIC PANEL
ALT: 15 U/L (ref 0–44)
AST: 18 U/L (ref 15–41)
Albumin: 3.8 g/dL (ref 3.5–5.0)
Alkaline Phosphatase: 79 U/L (ref 38–126)
Anion gap: 9 (ref 5–15)
BUN: 8 mg/dL (ref 8–23)
CO2: 24 mmol/L (ref 22–32)
Calcium: 9.8 mg/dL (ref 8.9–10.3)
Chloride: 109 mmol/L (ref 98–111)
Creatinine, Ser: 0.78 mg/dL (ref 0.44–1.00)
GFR calc Af Amer: >60 mL/min (ref >60)
GFR calc non Af Amer: >60 mL/min (ref >60)
Glucose, Bld: 156 mg/dL — ABNORMAL HIGH (ref 70–99)
Potassium: 3.9 mmol/L (ref 3.5–5.1)
Sodium: 142 mmol/L (ref 135–145)
Total Bilirubin: 0.5 mg/dL (ref 0.3–1.2)
Total Protein: 6.6 g/dL (ref 6.5–8.1)

## 2018-05-19 LAB — CBC
HCT: 44.3 % (ref 36.0–46.0)
Hemoglobin: 13.5 g/dL (ref 12.0–15.0)
MCH: 29.7 pg (ref 26.0–34.0)
MCHC: 30.5 g/dL (ref 30.0–36.0)
MCV: 97.6 fL (ref 80.0–100.0)
Platelets: 219 10*3/uL (ref 150–400)
RBC: 4.54 MIL/uL (ref 3.87–5.11)
RDW: 13.3 % (ref 11.5–15.5)
WBC: 8.8 10*3/uL (ref 4.0–10.5)
nRBC: 0 % (ref 0.0–0.2)

## 2018-05-19 LAB — LIPASE, BLOOD: Lipase: 37 U/L (ref 11–51)

## 2018-05-19 MED ORDER — ONDANSETRON HCL 4 MG/2ML IJ SOLN
4.0000 mg | Freq: Once | INTRAMUSCULAR | Status: AC | PRN
  Administered 2018-05-19: 4 mg via INTRAVENOUS
  Filled 2018-05-19: qty 2

## 2018-05-19 MED ORDER — IOPAMIDOL (ISOVUE-300) INJECTION 61%
INTRAVENOUS | Status: AC
  Filled 2018-05-19: qty 100

## 2018-05-19 MED ORDER — SODIUM CHLORIDE (PF) 0.9 % IJ SOLN
INTRAMUSCULAR | Status: AC
  Filled 2018-05-19: qty 50

## 2018-05-19 MED ORDER — CIPROFLOXACIN HCL 500 MG PO TABS: 500.0000 mg | ORAL_TABLET | Freq: Two times a day (BID) | ORAL | 0 refills | Status: DC

## 2018-05-19 MED ORDER — METRONIDAZOLE 500 MG PO TABS
500.0000 mg | ORAL_TABLET | Freq: Once | ORAL | Status: AC
  Administered 2018-05-19: 500 mg via ORAL
  Filled 2018-05-19: qty 1

## 2018-05-19 MED ORDER — HYDROCODONE-ACETAMINOPHEN 5-325 MG PO TABS: 1.0000 | ORAL_TABLET | Freq: Four times a day (QID) | ORAL | 0 refills | Status: DC | PRN

## 2018-05-19 MED ORDER — SODIUM CHLORIDE 0.9 % IV BOLUS
1000.0000 mL | Freq: Once | INTRAVENOUS | Status: AC
  Administered 2018-05-19: 1000 mL via INTRAVENOUS

## 2018-05-19 MED ORDER — KETOROLAC TROMETHAMINE 30 MG/ML IJ SOLN
15.0000 mg | Freq: Once | INTRAMUSCULAR | Status: AC
  Administered 2018-05-19: 15 mg via INTRAVENOUS
  Filled 2018-05-19: qty 1

## 2018-05-19 MED ORDER — CIPROFLOXACIN HCL 500 MG PO TABS
500.0000 mg | ORAL_TABLET | Freq: Once | ORAL | Status: AC
  Administered 2018-05-19: 500 mg via ORAL
  Filled 2018-05-19: qty 1

## 2018-05-19 MED ORDER — SODIUM CHLORIDE 0.9% FLUSH: 3.0000 mL | Freq: Once | INTRAVENOUS | Status: DC

## 2018-05-19 MED ORDER — METRONIDAZOLE 500 MG PO TABS: 500.0000 mg | ORAL_TABLET | Freq: Three times a day (TID) | ORAL | 0 refills | Status: DC

## 2018-05-19 MED ORDER — OXYCODONE-ACETAMINOPHEN 5-325 MG PO TABS: 2.0000 | ORAL_TABLET | Freq: Four times a day (QID) | ORAL | 0 refills | Status: DC | PRN

## 2018-05-19 MED ORDER — IOPAMIDOL (ISOVUE-300) INJECTION 61%
100.0000 mL | Freq: Once | INTRAVENOUS | Status: AC | PRN
  Administered 2018-05-19: 100 mL via INTRAVENOUS

## 2018-05-19 MED ORDER — ONDANSETRON 4 MG PO TBDP: ORAL_TABLET | ORAL | 0 refills | Status: DC

## 2018-05-19 NOTE — ED Notes (Signed)
She is awake, alert and in no distress. She is aware we are to perform CT scan next in her treatment plan.

## 2018-05-19 NOTE — Discharge Instructions (Addendum)
Follow up with your md next week.  Return if  problems °

## 2018-05-19 NOTE — ED Triage Notes (Addendum)
Pt states she has been achy and weak x 1 week. Pt states she was visiting friend last week at [place], and has not felt well since. Pt had an unknown virus. Diarrhea since last night, approximately 4 times. Cramping prior to that.

## 2018-05-19 NOTE — ED Notes (Signed)
PT. CALLING HUSBAND FOR RIDE.

## 2018-05-19 NOTE — ED Notes (Signed)
ED Provider at bedside. 

## 2018-05-19 NOTE — ED Notes (Signed)
RX X 4 TO BE PICKED UP

## 2018-05-23 NOTE — ED Provider Notes (Signed)
[facility] DEPT Provider Note   CSN: [id number] Arrival date & time: 05/19/18  1101    History   Chief Complaint Chief Complaint  Patient presents with  . Weakness  . Abdominal Pain  . Nausea    HPI [name] [name] is a 73 y.o. female.     Patient complains of left lower quadrant abdominal pain.  Mild nausea no vomiting this is been going on for a few days  The history is provided by the patient. No language interpreter was used.  Abdominal Pain  Pain location:  LLQ Pain quality: aching   Pain radiates to:  Does not radiate Pain severity:  Moderate Onset quality:  Sudden Timing:  Constant Progression:  Worsening Chronicity:  Recurrent Context: not alcohol use   Relieved by:  Nothing Worsened by:  Nothing Ineffective treatments:  None tried Associated symptoms: nausea   Associated symptoms: no chest pain, no cough, no diarrhea, no fatigue and no hematuria     Past Medical History:  Diagnosis Date  . Alcohol abuse   . Anxiety    takes Valium daily as needed and Ativan daily  . Bilateral hearing loss   . Bipolar 1 disorder ([place])    takes Lithium nightly and Synthroid daily  . Chronic back pain    DDD; "all over" (09/14/2017)  . Colitis, ischemic ([place]) 2012  . Confusion    r/t meds  . Depression    takes Prozac daily and Bupspirone   . Diverticulosis   . Diverticulosis   . Dyslipidemia    takes Crestor daily  . Fibromyalgia   . Headache    "weekly" (09/14/2017)  . Hepatic steatosis 06/18/12   severe  . History of kidney stones   . Hyperlipidemia   . Hypertension   . Hypothyroidism   . IBS (irritable bowel syndrome)   . Ischemic colitis ([place])   . Joint pain   . Joint swelling   . Lupus erythematosus tumidus    tumid-skin  . Migraine    "1-2/yr; maybe" (09/14/2017)  . Numbness    in right foot  . Osteoarthritis    "all over" (09/14/2017)  . Osteoarthritis cervical spine   . Osteoarthritis of hand    bilateral  .  Pneumonia    "walking pneumonia several times; long time since the last time" (09/14/2017)  . Restless leg syndrome    takes Requip nightly  . Sciatica   . Type II diabetes mellitus ([place])   . Urinary frequency   . Urinary leakage   . Urinary urgency   . Urinary, incontinence, stress female   . Walking pneumonia    last time more than 3yrs ago    Patient Active Problem List   Diagnosis Date Noted  . Chest pain   . Chest pain with normal coronary angiography   . Diverticulitis 11/19/2016  . Hypothyroidism 07/14/2016  . Diarrhea   . Generalized abdominal pain   . Major depressive disorder, recurrent severe without psychotic features ([place]) 12/31/2015  . Nausea without vomiting   . Colitis 10/28/2015  . Unintentional poisoning by psychotropic drug 07/05/2015  . Restless leg syndrome 07/05/2015  . Diabetic polyneuropathy associated with type 2 diabetes mellitus ([place]) 09/21/2014  . Arthritis of left hip 06/09/2013  . Arthritis of right hip 06/08/2013  . Hip pain, right 05/02/2013  . IBS (irritable bowel syndrome) 01/02/2013  . Unspecified vitamin D deficiency 07/05/2012  . Pure hyperglyceridemia 07/04/2012  . Diabetes mellitus with diabetic neuropathy, without  long-term current use of insulin ([place]) 06/18/2012  . Hypertension 06/18/2012  . Bipolar 1 disorder ([place]) 06/18/2012  . Low back pain 02/25/2011  . Acute ischemic colitis ([place]) 01/06/2011  . Sciatica 12/19/2010  . Fibromyalgia 10/28/2010  . Primary osteoarthritis of right hip 10/24/2010  . Hyperlipidemia with target low density lipoprotein (LDL) cholesterol less than 100 mg/dL 05/05/2010  . DEPRESSION/ANXIETY 05/05/2010  . ABUSE, ALCOHOL, IN REMISSION 05/05/2010  . OTITIS MEDIA, CHRONIC 05/05/2010  . Hearing loss 05/05/2010  . ALLERGIC RHINITIS, SEASONAL, MILD 05/05/2010  . URINARY INCONTINENCE, STRESS, FEMALE 05/05/2010  . OSTEOARTHRITIS, HANDS, BILATERAL 05/05/2010  . OSTEOARTHRITIS, CERVICAL SPINE 05/05/2010     Past Surgical History:  Procedure Laterality Date  . ABDOMINAL HYSTERECTOMY     "they left my ovaries"  . APPENDECTOMY    . CARDIAC CATHETERIZATION  09/14/2017  . COLONOSCOPY    . DENTAL SURGERY Left 10/2016   dental implant  . DILATION AND CURETTAGE OF UTERUS  X 4  . ESOPHAGOGASTRODUODENOSCOPY    . FLEXIBLE SIGMOIDOSCOPY N/A 06/21/2012   Procedure: FLEXIBLE SIGMOIDOSCOPY;  Surgeon: Jerene Bears, MD;  Location: [facility];  Service: Gastroenterology;  Laterality: N/A;  . JOINT REPLACEMENT    . LEFT HEART CATH AND CORONARY ANGIOGRAPHY N/A 09/14/2017   Procedure: LEFT HEART CATH AND CORONARY ANGIOGRAPHY;  Surgeon: Belva Crome, MD;  Location: [facility];  Service: Cardiovascular;  Laterality: N/A;  . SHOULDER ARTHROSCOPY Right    "shaved spurs off rotator cuff"  . TONSILLECTOMY    . TOTAL HIP ARTHROPLASTY Right 06/09/2013   Procedure: TOTAL HIP ARTHROPLASTY;  Surgeon: Kerin Salen, MD;  Location: [facility];  Service: Orthopedics;  Laterality: Right;  . TUBAL LIGATION    . TUMOR EXCISION Right 1968   angle of jaw; benign     OB History   No obstetric history on file.      Home Medications    Prior to Admission medications   Medication Sig Start Date End Date Taking? Authorizing Provider  ALPRAZolam Duanne Moron) 0.5 MG tablet Take 1 tablet (0.5 mg total) by mouth 4 (four) times daily as needed for anxiety. 05/26/18   [name], PMHNP  atorvastatin (LIPITOR) 40 MG tablet Take 40 mg by mouth at bedtime.     [provider]  baclofen (LIORESAL) 10 MG tablet Take 20 mg by mouth 3 (three) times daily as needed for muscle spasms.    [provider]  BD PEN NEEDLE NANO U/F 32G X 4 MM MISC USE TO INJECT 5 TIMES DAILY AS DIRECTED 02/07/18   Elayne Snare, MD  cholecalciferol (VITAMIN D) 1000 units tablet Take 2,000 Units by mouth daily.     [provider]  ciprofloxacin (CIPRO) 500 MG tablet Take 1 tablet (500 mg total) by mouth 2 (two) times daily. 05/19/18    Milton Ferguson, MD  cyclobenzaprine (FLEXERIL) 5 MG tablet Take 5 mg by mouth 3 (three) times daily as needed for muscle spasms.    [provider]  dicyclomine (BENTYL) 10 MG capsule Take 1 capsule (10 mg total) by mouth every 8 (eight) hours as needed for spasms. 12/29/16   Doran Stabler, MD  divalproex (DEPAKOTE ER) 250 MG 24 hr tablet Take 2 tablets (500 mg total) by mouth at bedtime. 03/14/18 06/12/18  [name], PMHNP  donepezil (ARICEPT) 10 MG tablet Take 1 tablet at Bedtime 01/11/18   [name], PMHNP  DUREZOL 0.05 % EMUL Place 1 drop into the right  eye 3 (three) times daily. 05/04/18   [provider]  gabapentin (NEURONTIN) 400 MG capsule Take 400 mg by mouth 3 (three) times daily.     [provider]  glucose blood (CONTOUR NEXT TEST) test strip Use to test blood sugar 2 times daily 04/28/18   Elayne Snare, MD  IBU 800 MG tablet Take 800 mg by mouth 3 (three) times daily as needed for pain. 02/10/17   [provider]  Insulin Glargine (LANTUS SOLOSTAR) 100 UNIT/ML Solostar Pen Inject 44 Units into the skin daily. INJECT 44 UNITS UNDER THE SKIN ONCE DAILY AT BEDTIME.    [provider]  insulin lispro (HUMALOG KWIKPEN) 100 UNIT/ML KwikPen Inject 10 Units into the skin 3 (three) times daily. INJECT 10 UNITS UNDER THE SKIN THREE TIMES DAILY BEFORE MEALS.    [provider]  Lancets 30G MISC 1 each by Does not apply route daily. 09/13/17   Elayne Snare, MD  levothyroxine (SYNTHROID, LEVOTHROID) 75 MCG tablet Take 1 tablet (75 mcg total) by mouth daily before breakfast. I tablet daily before breakfast 08/18/17   Elayne Snare, MD  lithium carbonate (ESKALITH) 450 MG CR tablet Take 450 mg by mouth at bedtime.    [provider]  losartan (COZAAR) 50 MG tablet Take 25 mg by mouth daily.     [provider]  Melatonin 3 MG TABS Take 9 mg by mouth at bedtime.    [provider]  memantine (NAMENDA) 10 MG tablet Take  1 tablet (10 mg total) by mouth every evening. 01/11/18 04/11/18  [name], PMHNP  metroNIDAZOLE (FLAGYL) 500 MG tablet Take 1 tablet (500 mg total) by mouth 3 (three) times daily. One po bid x 7 days 05/19/18   Milton Ferguson, MD  nitroGLYCERIN (NITROSTAT) 0.4 MG SL tablet Place 1 tablet (0.4 mg total) under the tongue every 5 (five) minutes as needed for chest pain. 09/14/17   Lyda Jester M, PA-C  omega-3 acid ethyl esters (LOVAZA) 1 g capsule Take 1 g by mouth 2 (two) times daily.    [provider]  ondansetron (ZOFRAN ODT) 4 MG disintegrating tablet 4mg  ODT q4 hours prn nausea/vomit 05/19/18   Milton Ferguson, MD  ondansetron (ZOFRAN) 8 MG tablet Take 1 tablet (8 mg total) by mouth every 8 (eight) hours as needed for nausea. 01/01/18   Orlie Dakin, MD  oxyCODONE-acetaminophen (PERCOCET) 5-325 MG tablet Take 2 tablets by mouth every 6 (six) hours as needed. 05/19/18   Milton Ferguson, MD  polyethylene glycol [facility]) packet Take 17 g by mouth daily as needed. 11/20/16   Lavina Hamman, MD  QUEtiapine (SEROQUEL) 100 MG tablet Take 1.5 tabs po q am and 2 tabs po QHS 05/18/18   [name], PMHNP  rOPINIRole (REQUIP) 0.5 MG tablet Take 3 tablets (1.5 mg total) by mouth 2 (two) times daily. 05/18/18   [name], PMHNP  Semaglutide, 1 MG/DOSE, (OZEMPIC, 1 MG/DOSE,) 2 MG/1.5ML SOPN Inject 1 mg into the skin once a week. 05/09/18   Elayne Snare, MD  traMADol (ULTRAM) 50 MG tablet Take 1 tablet (50 mg total) by mouth every 6 (six) hours as needed. 03/13/17   Dorie Rank, MD  zaleplon (SONATA) 5 MG capsule Take 1 capsule (5 mg total) by mouth at bedtime. 04/14/18   [name], PMHNP    Family History Family History  Problem Relation Age of Onset  . Drug abuse Mother   . Alcohol abuse Mother   . Alcohol abuse  Father   . Hypertension Father   . CAD Brother   . Hypertension Brother   . Alcohol abuse Brother   . Hypertension Brother   . Hypertension Brother   . Psoriasis  Daughter   . Arthritis Daughter        psoriatic arthritis   . Alcohol abuse Grandchild     Social History Social History   Tobacco Use  . Smoking status: Never Smoker  . Smokeless tobacco: Never Used  Substance Use Topics  . Alcohol use: Not Currently    Comment: 09/14/2017 "nothing since 04/15/2008"  . Drug use: Yes    Frequency: 7.0 times per week    Types: Benzodiazepines    Comment: Pt prescribed Xanax .5 mg, 4x a day     Allergies   Codeine; Macrolides and ketolides; Procaine hcl; Aspirin; Benadryl [diphenhydramine]; Diflucan [fluconazole]; Dilaudid [hydromorphone hcl]; Other; and Sulfa antibiotics   Review of Systems Review of Systems  Constitutional: Negative for appetite change and fatigue.  HENT: Negative for congestion, ear discharge and sinus pressure.   Eyes: Negative for discharge.  Respiratory: Negative for cough.   Cardiovascular: Negative for chest pain.  Gastrointestinal: Positive for abdominal pain and nausea. Negative for diarrhea.  Genitourinary: Negative for frequency and hematuria.  Musculoskeletal: Negative for back pain.  Skin: Negative for rash.  Neurological: Negative for seizures and headaches.  Psychiatric/Behavioral: Negative for hallucinations.     Physical Exam Updated Vital Signs BP 134/80   Pulse 83   Temp 98.6 F (37 C) (Oral)   Resp 11   Ht 5\' 2"  (1.575 m)   Wt 77.1 kg   SpO2 94%   BMI 31.09 kg/m   Physical Exam Vitals signs and nursing note reviewed.  Constitutional:      Appearance: She is well-developed.  HENT:     Head: Normocephalic.     Nose: Nose normal.  Eyes:     General: No scleral icterus.    Conjunctiva/sclera: Conjunctivae normal.  Neck:     Musculoskeletal: Neck supple.     Thyroid: No thyromegaly.  Cardiovascular:     Rate and Rhythm: Normal rate and regular rhythm.     Heart sounds: No murmur. No friction rub. No gallop.   Pulmonary:     Breath sounds: No stridor. No wheezing or rales.  Chest:      Chest wall: No tenderness.  Abdominal:     General: There is no distension.     Tenderness: There is abdominal tenderness. There is no rebound.     Comments: Moderate left lower quadrant tenderness  Musculoskeletal: Normal range of motion.  Lymphadenopathy:     Cervical: No cervical adenopathy.  Skin:    Findings: No erythema or rash.  Neurological:     Mental Status: She is oriented to person, place, and time.     Motor: No abnormal muscle tone.     Coordination: Coordination normal.  Psychiatric:        Behavior: Behavior normal.      ED Treatments / Results  Labs (all labs ordered are listed, but only abnormal results are displayed) Labs Reviewed  COMPREHENSIVE METABOLIC PANEL - Abnormal; Notable for the following components:      Result Value   Glucose, Bld 156 (*)    All other components within normal limits  URINALYSIS, ROUTINE W REFLEX MICROSCOPIC - Abnormal; Notable for the following components:   Color, Urine STRAW (*)    All other components within normal limits  DIFFERENTIAL -  Abnormal; Notable for the following components:   Abs Immature Granulocytes 0.13 (*)    All other components within normal limits  LIPASE, BLOOD  CBC    EKG EKG Interpretation  Date/Time:  Thursday May 19 2018 12:22:36 EDT Ventricular Rate:  78 PR Interval:    QRS Duration: 90 QT Interval:  386 QTC Calculation: 440 R Axis:   58 Text Interpretation:  Sinus rhythm Low voltage, precordial leads Abnormal ekg Confirmed by Carmin Muskrat [phone]) on 05/20/2018 10:18:49 PM   Radiology No results found.  Procedures Procedures (including critical care time)  Medications Ordered in ED Medications  ondansetron (ZOFRAN) injection 4 mg (4 mg Intravenous Given 05/19/18 1225)  sodium chloride 0.9 % bolus 1,000 mL (0 mLs Intravenous Stopped 05/19/18 1610)  ketorolac (TORADOL) 30 MG/ML injection 15 mg (15 mg Intravenous Given 05/19/18 1225)  iopamidol (ISOVUE-300) 61 % injection 100 mL  (100 mLs Intravenous Contrast Given 05/19/18 1510)  metroNIDAZOLE (FLAGYL) tablet 500 mg (500 mg Oral Given 05/19/18 1607)  ciprofloxacin (CIPRO) tablet 500 mg (500 mg Oral Given 05/19/18 1607)     Initial Impression / Assessment and Plan / ED Course  I have reviewed the triage vital signs and the nursing notes.  Pertinent labs & imaging results that were available during my care of the patient were reviewed by me and considered in my medical decision making (see chart for details).        CT scan shows diverticulitis patient is nontoxic.  Labs unremarkable.  She will be placed on Cipro Flagyl pain medicine and nausea medicine and follow-up with her doctor  Final Clinical Impressions(s) / ED Diagnoses   Final diagnoses:  Diverticulitis    ED Discharge Orders         Ordered    ciprofloxacin (CIPRO) 500 MG tablet  2 times daily     05/19/18 1558    metroNIDAZOLE (FLAGYL) 500 MG tablet  3 times daily     05/19/18 1558    HYDROcodone-acetaminophen (NORCO/VICODIN) 5-325 MG tablet  Every 6 hours PRN,   Status:  Discontinued     05/19/18 1558    ondansetron (ZOFRAN ODT) 4 MG disintegrating tablet     05/19/18 1558    oxyCODONE-acetaminophen (PERCOCET) 5-325 MG tablet  Every 6 hours PRN     05/19/18 1608           Milton Ferguson, MD 05/23/18 1207

## 2018-05-25 DIAGNOSIS — L309 Dermatitis, unspecified: Secondary | ICD-10-CM | POA: Diagnosis not present

## 2018-05-25 DIAGNOSIS — R109 Unspecified abdominal pain: Secondary | ICD-10-CM | POA: Diagnosis not present

## 2018-05-25 DIAGNOSIS — K5792 Diverticulitis of intestine, part unspecified, without perforation or abscess without bleeding: Secondary | ICD-10-CM | POA: Diagnosis not present

## 2018-05-25 DIAGNOSIS — R110 Nausea: Secondary | ICD-10-CM | POA: Diagnosis not present

## 2018-05-25 DIAGNOSIS — R197 Diarrhea, unspecified: Secondary | ICD-10-CM | POA: Diagnosis not present

## 2018-05-31 ENCOUNTER — Other Ambulatory Visit: Payer: Self-pay | Admitting: Family Medicine

## 2018-05-31 ENCOUNTER — Telehealth: Payer: Self-pay | Admitting: Gastroenterology

## 2018-05-31 ENCOUNTER — Other Ambulatory Visit: Payer: Self-pay

## 2018-05-31 ENCOUNTER — Ambulatory Visit
Admission: RE | Admit: 2018-05-31 | Discharge: 2018-05-31 | Disposition: A | Discharge status: Home / Self Care | Payer: Medicare Other | Source: Ambulatory Visit | Attending: Family Medicine | Admitting: Family Medicine

## 2018-05-31 ENCOUNTER — Ambulatory Visit (INDEPENDENT_AMBULATORY_CARE_PROVIDER_SITE_OTHER): Payer: Medicare Other | Admitting: Gastroenterology

## 2018-05-31 ENCOUNTER — Encounter: Payer: Self-pay | Admitting: Gastroenterology

## 2018-05-31 VITALS — BP 116/70 | HR 88 | Temp 97.1°F | Ht 60.5 in | Wt 172.0 lb

## 2018-05-31 DIAGNOSIS — K5792 Diverticulitis of intestine, part unspecified, without perforation or abscess without bleeding: Secondary | ICD-10-CM

## 2018-05-31 DIAGNOSIS — K529 Noninfective gastroenteritis and colitis, unspecified: Secondary | ICD-10-CM

## 2018-05-31 DIAGNOSIS — K5732 Diverticulitis of large intestine without perforation or abscess without bleeding: Secondary | ICD-10-CM

## 2018-05-31 DIAGNOSIS — R1032 Left lower quadrant pain: Secondary | ICD-10-CM

## 2018-05-31 MED ORDER — DIPHENOXYLATE-ATROPINE 2.5-0.025 MG PO TABS: 1.0000 | ORAL_TABLET | Freq: Two times a day (BID) | ORAL | 0 refills | Status: DC | PRN

## 2018-05-31 MED ORDER — AMOXICILLIN-POT CLAVULANATE 875-125 MG PO TABS: 1.0000 | ORAL_TABLET | Freq: Two times a day (BID) | ORAL | 0 refills | Status: AC

## 2018-05-31 MED ORDER — TRAMADOL HCL 50 MG PO TABS: 50.0000 mg | ORAL_TABLET | Freq: Four times a day (QID) | ORAL | 0 refills | Status: DC | PRN

## 2018-05-31 MED ORDER — MESALAMINE 800 MG PO TBEC: 1600.0000 mg | DELAYED_RELEASE_TABLET | Freq: Two times a day (BID) | ORAL | 0 refills | Status: DC

## 2018-05-31 MED ORDER — IOPAMIDOL (ISOVUE-300) INJECTION 61%
100.0000 mL | Freq: Once | INTRAVENOUS | Status: AC | PRN
  Administered 2018-05-31: 100 mL via INTRAVENOUS

## 2018-05-31 NOTE — Telephone Encounter (Signed)
Pt scheduled to see Dr. Loletha Carrow today at 2pm, pt aware of appt.

## 2018-05-31 NOTE — Patient Instructions (Addendum)
If you are age 71 or older, your body mass index should be between 23-30. Your Body mass index is 33.04 kg/m. If this is out of the aforementioned range listed, please consider follow up with your Primary Care Provider.  If you are age 92 or younger, your body mass index should be between 19-25. Your Body mass index is 33.04 kg/m. If this is out of the aformentioned range listed, please consider follow up with your Primary Care Provider.   To help prevent the possible spread of infection to our patients, communities, and staff; we will be implementing the following measures:  Please only allow one visitor/family member to accompany you to any upcoming appointments with [facility]. If you have any concerns about this please contact our office to discuss prior to the appointment.   We have sent the following medications to your pharmacy for you to pick up at your convenience: Augmentin Tramadol Lomotil Mesalamine   Thank you for entrusting me with your care and for choosing [facility], Dr. Loletha Carrow

## 2018-05-31 NOTE — Telephone Encounter (Signed)
I can see her at 2 PM today

## 2018-05-31 NOTE — Telephone Encounter (Signed)
Pt seen in ER for LLQ pain 05/19/18, had CT scan diagnosed with diverticulitis. Pt was given 7 days of cipro and flagyl, states she completed this medication. Pt was having diarrhea prior to starting the meds. States the LLQ pain is worse now than before starting the antibiotics, she is still having diarrhea and nausea. Referral was sent by pts PCP to see pt. Please advise.

## 2018-05-31 NOTE — Telephone Encounter (Signed)
Left message for pt to call back  °

## 2018-05-31 NOTE — Progress Notes (Signed)
[facility] Progress Note  Chief Complaint: Left lower quadrant pain  Subjective  History:  I last saw her in clinic October 2018 with a history of diarrhea predominant IBS setting of chronic anxiety, history of ischemic colitis in 2012 and 2014 under the care of Dr. Olevia Perches.  She has had recurrent bouts of what are believed to be diverticulitis exacerbations of left lower quadrant pain and diarrhea.  Colonoscopy at that time showed significant left-sided diverticulosis, biopsy negative for microscopic colitis.   She is had an ED visit for this in October 2019, several CT scans prior to that in 2019 as well.  She was in the ED on March 12 for this pain and diarrhea and a CT scan showed chronic stable thickening of left colon with diverticulosis and perhaps slight colonic stranding.  She was given 7 days of ciprofloxacin and Flagyl, says her pain is been escalating and her diarrhea is no better. I read a recent behavioral health clinic note outlining this patient's anxiety and panic disorder, including an extensive list of medicines that have previously been tried to treat that. She had been in the emergency department the day prior to that after taking too many of her Xanax tablets, which was suspected to been unintentional and perhaps related to confusion regarding her medicine doses.   Fanchon complains of worsening left lower quadrant pain and diarrhea over the last several months.  She feels that she has really not regularly had formed stool in a couple of months, though on occasion she will unexpectedly have firm stool that is difficult to pass.  She is having 6 or 8 BMs per day including overnight BMs with urgency.   She denies rectal bleeding.  Appetite has dropped off lately with the symptoms, no apparent weight loss.   ROS: Cardiovascular:  no chest pain Respiratory: no dyspnea She has been experiencing severe anxiety, made worse by the symptoms. Remainder of systems  negative except as above  The patient's Past Medical, Family and Social History were reviewed and are on file in the EMR.  Objective:  Med list reviewed  Current Outpatient Medications:  .  ALPRAZolam (XANAX) 0.5 MG tablet, Take 1 tablet (0.5 mg total) by mouth 4 (four) times daily as needed for anxiety., Disp: 120 tablet, Rfl: 2 .  atorvastatin (LIPITOR) 40 MG tablet, Take 40 mg by mouth at bedtime. , Disp: , Rfl: 0 .  baclofen (LIORESAL) 10 MG tablet, Take 20 mg by mouth 3 (three) times daily as needed for muscle spasms., Disp: , Rfl:  .  BD PEN NEEDLE NANO U/F 32G X 4 MM MISC, USE TO INJECT 5 TIMES DAILY AS DIRECTED, Disp: 300 each, Rfl: 0 .  cholecalciferol (VITAMIN D) 1000 units tablet, Take 2,000 Units by mouth daily. , Disp: , Rfl:  .  cyclobenzaprine (FLEXERIL) 5 MG tablet, Take 5 mg by mouth 3 (three) times daily as needed for muscle spasms., Disp: , Rfl:  .  dicyclomine (BENTYL) 10 MG capsule, Take 1 capsule (10 mg total) by mouth every 8 (eight) hours as needed for spasms., Disp: 12 capsule, Rfl: 0 .  divalproex (DEPAKOTE ER) 250 MG 24 hr tablet, Take 2 tablets (500 mg total) by mouth at bedtime., Disp: 180 tablet, Rfl: 1 .  donepezil (ARICEPT) 10 MG tablet, Take 1 tablet at Bedtime, Disp: 90 tablet, Rfl: 1 .  DUREZOL 0.05 % EMUL, Place 1 drop into the right eye 3 (three) times daily., Disp: , Rfl:  .  gabapentin (NEURONTIN) 400 MG capsule, Take 400 mg by mouth 3 (three) times daily. , Disp: , Rfl:  .  glucose blood (CONTOUR NEXT TEST) test strip, Use to test blood sugar 2 times daily, Disp: 100 each, Rfl: 3 .  IBU 800 MG tablet, Take 800 mg by mouth 3 (three) times daily as needed for pain., Disp: , Rfl: 1 .  Insulin Glargine (LANTUS SOLOSTAR) 100 UNIT/ML Solostar Pen, Inject 44 Units into the skin daily. INJECT 44 UNITS UNDER THE SKIN ONCE DAILY AT BEDTIME., Disp: , Rfl:  .  insulin lispro (HUMALOG KWIKPEN) 100 UNIT/ML KwikPen, Inject 10 Units into the skin 3 (three) times daily.  INJECT 10 UNITS UNDER THE SKIN THREE TIMES DAILY BEFORE MEALS., Disp: , Rfl:  .  Lancets 30G MISC, 1 each by Does not apply route daily., Disp: 100 each, Rfl: 3 .  levothyroxine (SYNTHROID, LEVOTHROID) 75 MCG tablet, Take 1 tablet (75 mcg total) by mouth daily before breakfast. I tablet daily before breakfast, Disp: 90 tablet, Rfl: 30 .  lithium carbonate (ESKALITH) 450 MG CR tablet, Take 450 mg by mouth at bedtime., Disp: , Rfl:  .  losartan (COZAAR) 50 MG tablet, Take 25 mg by mouth daily. , Disp: , Rfl: 0 .  Melatonin 3 MG TABS, Take 9 mg by mouth at bedtime., Disp: , Rfl:  .  nitroGLYCERIN (NITROSTAT) 0.4 MG SL tablet, Place 1 tablet (0.4 mg total) under the tongue every 5 (five) minutes as needed for chest pain., Disp: 25 tablet, Rfl: 2 .  omega-3 acid ethyl esters (LOVAZA) 1 g capsule, Take 1 g by mouth 2 (two) times daily., Disp: , Rfl:  .  ondansetron (ZOFRAN ODT) 4 MG disintegrating tablet, 4mg  ODT q4 hours prn nausea/vomit, Disp: 12 tablet, Rfl: 0 .  polyethylene glycol (MIRALAX) packet, Take 17 g by mouth daily as needed., Disp: 14 each, Rfl: 0 .  QUEtiapine (SEROQUEL) 100 MG tablet, Take 1.5 tabs po q am and 2 tabs po QHS, Disp: 75 tablet, Rfl: 0 .  rOPINIRole (REQUIP) 0.5 MG tablet, Take 3 tablets (1.5 mg total) by mouth 2 (two) times daily., Disp: 540 tablet, Rfl: 1 .  Semaglutide, 1 MG/DOSE, (OZEMPIC, 1 MG/DOSE,) 2 MG/1.5ML SOPN, Inject 1 mg into the skin once a week., Disp: 2 pen, Rfl: 3 .  traMADol (ULTRAM) 50 MG tablet, Take 1 tablet (50 mg total) by mouth every 6 (six) hours as needed., Disp: 15 tablet, Rfl: 0 .  zaleplon (SONATA) 5 MG capsule, Take 1 capsule (5 mg total) by mouth at bedtime., Disp: 30 capsule, Rfl: 2 .  amoxicillin-clavulanate (AUGMENTIN) 875-125 MG tablet, Take 1 tablet by mouth 2 (two) times daily for 7 days., Disp: 14 tablet, Rfl: 0 .  diphenoxylate-atropine (LOMOTIL) 2.5-0.025 MG tablet, Take 1 tablet by mouth 2 (two) times daily as needed for diarrhea or  loose stools., Disp: 30 tablet, Rfl: 0 .  memantine (NAMENDA) 10 MG tablet, Take 1 tablet (10 mg total) by mouth every evening., Disp: 90 tablet, Rfl: 1 .  Mesalamine 800 MG TBEC, Take 2 tablets (1,600 mg total) by mouth 2 (two) times daily for 3 days., Disp: 180 tablet, Rfl: 0 .  ondansetron (ZOFRAN) 8 MG tablet, Take 1 tablet (8 mg total) by mouth every 8 (eight) hours as needed for nausea. (Patient not taking: Reported on 05/31/2018), Disp: 8 tablet, Rfl: 0 .  traMADol (ULTRAM) 50 MG tablet, Take 1 tablet (50 mg total) by mouth every 6 (six) hours as  needed for up to 12 doses., Disp: 12 tablet, Rfl: 0   Vital signs in last 24 hrs: Vitals:   05/31/18 1425  BP: 116/70  Pulse: 88  Temp: (!) 97.1 F (36.2 C)    Physical Exam  Pleasant, anxious appearing.  Certainly nontoxic.  HEENT: sclera anicteric, oral mucosa moist without lesions  Neck: supple, no thyromegaly, JVD or lymphadenopathy  Cardiac: RRR without murmurs, S1S2 heard, no peripheral edema  Pulm: clear to auscultation bilaterally, normal RR and effort noted  Abdomen: soft, LLQ tenderness, with active bowel sounds. No guarding or palpable hepatosplenomegaly.  No distention.  Skin; warm and dry, no jaundice or rash  Recent Labs:  CBC Latest Ref Rng & Units 05/19/2018 03/13/2018 01/01/2018  WBC 4.0 - 10.5 K/uL 8.8 9.4 8.2  Hemoglobin 12.0 - 15.0 g/dL 13.5 12.6 13.1  Hematocrit 36.0 - 46.0 % 44.3 40.6 42.4  Platelets 150 - 400 K/uL 219 199 211     Radiologic studies:  I personally reviewed the images and the October 2019 and May 19, 2018 CT scans of the abdomen.  I do not personally appreciate pericolonic stranding.  I also personally reviewed the images from the CT scan earlier today with report as follows: "CLINICAL DATA:  Diverticulitis, recent abx Lower abdomen pain and pain across the lower pelvis Hx of appy, hysterectomy, right hip pain Cancer-none   EXAM: CT ABDOMEN AND PELVIS WITH CONTRAST   TECHNIQUE:  Multidetector CT imaging of the abdomen and pelvis was performed using the standard protocol following bolus administration of intravenous contrast.   CONTRAST:  168mL ISOVUE-300 IOPAMIDOL (ISOVUE-300) INJECTION 61%   COMPARISON:  05/19/2018, 12/31/2012   FINDINGS: Lower chest: Subcentimeter subpleural nodule in the posterior left lower lobe image 24/5 present since 12/31/2012, presumably benign. Lung bases otherwise clear. No pleural or pericardial effusion.   Hepatobiliary: No focal liver abnormality is seen. No gallstones, gallbladder wall thickening, or biliary dilatation.   Pancreas: Unremarkable. No pancreatic ductal dilatation or surrounding inflammatory changes.   Spleen: Normal in size without focal abnormality.   Adrenals/Urinary Tract: Normal adrenal glands. Normal left kidney. Stable prominent right renal collecting system and extrarenal pelvis, without urolithiasis or ureterectasis. Urinary bladder incompletely distended.   Stomach/Bowel: Mild circumferential wall thickening in the distal esophagus without dilatation. The stomach is incompletely distended. Small bowel decompressed. Appendix surgically absent. Moderate proximal colonic fecal material without dilatation. Scattered diverticula in the distal descending and sigmoid portions. Inflammatory/edematous changes around the proximal sigmoid segment, increased since previous. No evidence of abscess.   Vascular/Lymphatic: Minimal calcified aortic plaque without aneurysm or stenosis. No abdominal or pelvic adenopathy. Portal vein patent.   Reproductive: Status post hysterectomy. No adnexal masses.   Other: Bilateral pelvic phleboliths. No ascites. No free air.   Musculoskeletal: Right hip arthroplasty, incompletely visualized. Left hip DJD. Mild thoracolumbar levoscoliosis, with multilevel spondylitic change. No fracture or worrisome bone lesion.   IMPRESSION: 1. Sigmoid diverticulitis, progressive since  prior study, without abscess.     Electronically Signed   By: Lucrezia Europe M.D.   On: 05/31/2018 11:59"  Appears to be localized to a small area       @ASSESSMENTPLANBEGIN @ Assessment: Encounter Diagnoses  Name Primary?  Marland Kitchen LLQ abdominal pain Yes  . Chronic diarrhea   . Diverticulitis of colon    The diverticulitis is mild by radiographic criteria, and a little worse from what was seen 2 weeks ago.  WBC has remained normal.  It is difficult to tell if there is  actual bacterial infection versus sterile inflammation.  She has had recurrent treatments of antibiotics over the years, so perhaps she has developed some resistant bacteria.  Imodium has not been helping much to control the diarrhea.  I think all of this is made worse by her underlying IBS. I wonder if she could have a low-grade smoldering inflammation or bacterial infection, related to the underlying diverticulosis (SCAD). She recently was prescribed some tramadol which she feels has been reasonably helpful to control the pain, and she asked for a refill.  Plan: Augmentin 8 on 75/125 mg twice daily x7 days.  She understands this is likely to worsen the diarrhea. As needed Lomotil, 30 tablets prescribed Tramadol 50 mg, 12 tablets prescribed Asacol HD 800 mg tablet.  2 tablets twice daily x30 days.  I was unable to access the [place] controlled substance database due to a Comptroller issue, but I see from the recent ED note that they had done so, noting a tramadol prescription, and then the ED prescribed her a small supply of Percocet.  Have asked her to contact us in a week with update on symptoms.   Total time 30 minutes, over half spent face-to-face with patient in counseling and coordination of care.   [name]

## 2018-05-31 NOTE — Telephone Encounter (Signed)
Pt would like to be worked in said that she is in severe pain. Referral was sent over in proficient. If you can look over it.

## 2018-05-31 NOTE — Telephone Encounter (Signed)
Covid-19 travel screening questions  Have you traveled in the last 14 days? No If yes where?  Do you now or have you had a fever in the last 14 days? No  Do you have any respiratory symptoms of shortness of breath or cough now or in the last 14 days? No  Do you have a medical history of Congestive Heart Failure? N/a  Do you have a medical history of lung disease? N/a  Do you have any family members or close contacts with diagnosed or suspected Covid-19? No

## 2018-06-03 ENCOUNTER — Other Ambulatory Visit: Payer: Self-pay

## 2018-06-03 ENCOUNTER — Telehealth: Payer: Self-pay | Admitting: Psychiatry

## 2018-06-03 DIAGNOSIS — G2581 Restless legs syndrome: Secondary | ICD-10-CM

## 2018-06-03 MED ORDER — LITHIUM CARBONATE ER 450 MG PO TBCR: 450.0000 mg | EXTENDED_RELEASE_TABLET | Freq: Every day | ORAL | 0 refills | Status: DC

## 2018-06-03 MED ORDER — ROPINIROLE HCL 0.5 MG PO TABS: 1.5000 mg | ORAL_TABLET | Freq: Two times a day (BID) | ORAL | 0 refills | Status: DC

## 2018-06-03 NOTE — Progress Notes (Signed)
90 day submitted, has office visit 06/23/2018

## 2018-06-03 NOTE — Telephone Encounter (Signed)
Submitted to pharmacy per request

## 2018-06-03 NOTE — Telephone Encounter (Signed)
Pt will need RF of Lithium Carbonate 450mg  day and Ropinerol (Requip) 0.5mg   To [facility] [employer]

## 2018-06-06 ENCOUNTER — Telehealth: Payer: Self-pay

## 2018-06-06 MED ORDER — TRAMADOL HCL 50 MG PO TABS: 50.0000 mg | ORAL_TABLET | Freq: Four times a day (QID) | ORAL | 0 refills | Status: DC | PRN

## 2018-06-06 NOTE — Telephone Encounter (Signed)
Incoming fax request from [facility] for Tramadol 50 mg I every 6 hours as needed.pt was seen on 05-31-2018. Please advise.

## 2018-06-06 NOTE — Telephone Encounter (Signed)
Pt has been notified and aware.  

## 2018-06-06 NOTE — Telephone Encounter (Signed)
I will send one more Rx for this medicine, and then needs primary care to manage if needed longer than that.

## 2018-06-07 ENCOUNTER — Ambulatory Visit: Payer: Medicare Other | Admitting: Psychiatry

## 2018-06-18 ENCOUNTER — Emergency Department (HOSPITAL_COMMUNITY)
Admission: EM | Admit: 2018-06-18 | Discharge: 2018-06-18 | Disposition: A | Discharge status: Home / Self Care | Payer: Medicare Other | Attending: Emergency Medicine | Admitting: Emergency Medicine

## 2018-06-18 ENCOUNTER — Other Ambulatory Visit: Payer: Self-pay

## 2018-06-18 ENCOUNTER — Emergency Department (HOSPITAL_COMMUNITY): Payer: Medicare Other

## 2018-06-18 DIAGNOSIS — E039 Hypothyroidism, unspecified: Secondary | ICD-10-CM | POA: Insufficient documentation

## 2018-06-18 DIAGNOSIS — R0789 Other chest pain: Secondary | ICD-10-CM | POA: Insufficient documentation

## 2018-06-18 DIAGNOSIS — I10 Essential (primary) hypertension: Secondary | ICD-10-CM | POA: Diagnosis not present

## 2018-06-18 DIAGNOSIS — Z79899 Other long term (current) drug therapy: Secondary | ICD-10-CM | POA: Diagnosis not present

## 2018-06-18 DIAGNOSIS — R079 Chest pain, unspecified: Secondary | ICD-10-CM | POA: Diagnosis not present

## 2018-06-18 DIAGNOSIS — Z794 Long term (current) use of insulin: Secondary | ICD-10-CM | POA: Diagnosis not present

## 2018-06-18 DIAGNOSIS — Z96641 Presence of right artificial hip joint: Secondary | ICD-10-CM | POA: Diagnosis not present

## 2018-06-18 DIAGNOSIS — E119 Type 2 diabetes mellitus without complications: Secondary | ICD-10-CM | POA: Insufficient documentation

## 2018-06-18 DIAGNOSIS — R0602 Shortness of breath: Secondary | ICD-10-CM | POA: Diagnosis not present

## 2018-06-18 LAB — CBC WITH DIFFERENTIAL/PLATELET
Abs Immature Granulocytes: 0.08 10*3/uL — ABNORMAL HIGH (ref 0.00–0.07)
Basophils Absolute: 0 10*3/uL (ref 0.0–0.1)
Basophils Relative: 0 %
Eosinophils Absolute: 0.3 10*3/uL (ref 0.0–0.5)
Eosinophils Relative: 3 %
HCT: 43.1 % (ref 36.0–46.0)
Hemoglobin: 13.6 g/dL (ref 12.0–15.0)
Immature Granulocytes: 1 %
Lymphocytes Relative: 15 %
Lymphs Abs: 1.5 10*3/uL (ref 0.7–4.0)
MCH: 29.4 pg (ref 26.0–34.0)
MCHC: 31.6 g/dL (ref 30.0–36.0)
MCV: 93.1 fL (ref 80.0–100.0)
Monocytes Absolute: 0.5 10*3/uL (ref 0.1–1.0)
Monocytes Relative: 5 %
Neutro Abs: 7.7 10*3/uL (ref 1.7–7.7)
Neutrophils Relative %: 76 %
Platelets: 208 10*3/uL (ref 150–400)
RBC: 4.63 MIL/uL (ref 3.87–5.11)
RDW: 13.2 % (ref 11.5–15.5)
WBC: 10.1 10*3/uL (ref 4.0–10.5)
nRBC: 0 % (ref 0.0–0.2)

## 2018-06-18 LAB — TROPONIN I: Troponin I: <0.03 ng/mL (ref <0.03)

## 2018-06-18 LAB — BASIC METABOLIC PANEL
Anion gap: 9 (ref 5–15)
BUN: 7 mg/dL — ABNORMAL LOW (ref 8–23)
CO2: 24 mmol/L (ref 22–32)
Calcium: 9.9 mg/dL (ref 8.9–10.3)
Chloride: 106 mmol/L (ref 98–111)
Creatinine, Ser: 0.88 mg/dL (ref 0.44–1.00)
GFR calc Af Amer: >60 mL/min (ref >60)
GFR calc non Af Amer: >60 mL/min (ref >60)
Glucose, Bld: 135 mg/dL — ABNORMAL HIGH (ref 70–99)
Potassium: 4.4 mmol/L (ref 3.5–5.1)
Sodium: 139 mmol/L (ref 135–145)

## 2018-06-18 MED ORDER — LIDOCAINE VISCOUS HCL 2 % MT SOLN
15.0000 mL | Freq: Once | OROMUCOSAL | Status: AC
  Administered 2018-06-18: 15 mL via ORAL
  Filled 2018-06-18: qty 15

## 2018-06-18 MED ORDER — ALUM & MAG HYDROXIDE-SIMETH 200-200-20 MG/5ML PO SUSP
30.0000 mL | Freq: Once | ORAL | Status: AC
  Administered 2018-06-18: 30 mL via ORAL
  Filled 2018-06-18: qty 30

## 2018-06-18 MED ORDER — FENTANYL CITRATE (PF) 100 MCG/2ML IJ SOLN
50.0000 ug | Freq: Once | INTRAMUSCULAR | Status: AC
  Administered 2018-06-18: 50 ug via INTRAVENOUS
  Filled 2018-06-18: qty 2

## 2018-06-18 NOTE — ED Notes (Signed)
Patient verbalizes understanding of discharge instructions. Opportunity for questioning and answers were provided. Armband removed by staff, pt discharged from ED.  

## 2018-06-18 NOTE — ED Provider Notes (Signed)
[facility] EMERGENCY DEPARTMENT Provider Note   CSN: [id number] Arrival date & time: 06/18/18  1209    History   Chief Complaint Chief Complaint  Patient presents with   Chest Pain    HPI [name] [name] is a 73 y.o. female.     The history is provided by the patient.  Chest Pain  Pain location:  L chest and R chest Pain quality: aching   Pain radiates to:  Does not radiate Pain severity:  Mild Onset quality:  Gradual Timing:  Constant Progression:  Waxing and waning Chronicity:  New Context: eating and at rest   Relieved by:  Nothing Worsened by:  Nothing Associated symptoms: heartburn (burning in throat and hurting jaw)   Associated symptoms: no abdominal pain, no AICD problem, no anxiety, no back pain, no cough, no diaphoresis, no dizziness, no dysphagia, no fever, no numbness, no orthopnea, no palpitations, no PND, no shortness of breath and no vomiting   Risk factors: high cholesterol and hypertension   Risk factors: no coronary artery disease (normal heart cath last year)     Past Medical History:  Diagnosis Date   Alcohol abuse    Anxiety    takes Valium daily as needed and Ativan daily   Bilateral hearing loss    Bipolar 1 disorder ([name])    takes Lithium nightly and Synthroid daily   Chronic back pain    DDD; "all over" (09/14/2017)   Colitis, ischemic ([place]) 2012   Confusion    r/t meds   Depression    takes Prozac daily and Bupspirone    Diverticulosis    Dyslipidemia    takes Crestor daily   Fibromyalgia    Headache    "weekly" (09/14/2017)   Hepatic steatosis 06/18/12   severe   History of kidney stones    Hyperlipidemia    Hypertension    Hypothyroidism    IBS (irritable bowel syndrome)    Ischemic colitis ([place])    Joint pain    Joint swelling    Lupus erythematosus tumidus    tumid-skin   Migraine    "1-2/yr; maybe" (09/14/2017)   Numbness    in right foot   Osteoarthritis    "all over"  (09/14/2017)   Osteoarthritis cervical spine    Osteoarthritis of hand    bilateral   Pneumonia    "walking pneumonia several times; long time since the last time" (09/14/2017)   Restless leg syndrome    takes Requip nightly   Sciatica    Type II diabetes mellitus ([name])    Urinary frequency    Urinary leakage    Urinary urgency    Urinary, incontinence, stress female    Walking pneumonia    last time more than 53yrs ago    Patient Active Problem List   Diagnosis Date Noted   Chest pain    Chest pain with normal coronary angiography    Diverticulitis 11/19/2016   Hypothyroidism 07/14/2016   Diarrhea    Generalized abdominal pain    Major depressive disorder, recurrent severe without psychotic features ([place]) 12/31/2015   Nausea without vomiting    Colitis 10/28/2015   Unintentional poisoning by psychotropic drug 07/05/2015   Restless leg syndrome 07/05/2015   Diabetic polyneuropathy associated with type 2 diabetes mellitus ([place]) 09/21/2014   Arthritis of left hip 06/09/2013   Arthritis of right hip 06/08/2013   Hip pain, right 05/02/2013   IBS (irritable bowel syndrome) 01/02/2013   Unspecified vitamin  D deficiency 07/05/2012   Pure hyperglyceridemia 07/04/2012   Diabetes mellitus with diabetic neuropathy, without long-term current use of insulin ([place]) 06/18/2012   Hypertension 06/18/2012   Bipolar 1 disorder ([place]) 06/18/2012   Low back pain 02/25/2011   Acute ischemic colitis ([place]) 01/06/2011   Sciatica 12/19/2010   Fibromyalgia 10/28/2010   Primary osteoarthritis of right hip 10/24/2010   Hyperlipidemia with target low density lipoprotein (LDL) cholesterol less than 100 mg/dL 05/05/2010   DEPRESSION/ANXIETY 05/05/2010   ABUSE, ALCOHOL, IN REMISSION 05/05/2010   OTITIS MEDIA, CHRONIC 05/05/2010   Hearing loss 05/05/2010   ALLERGIC RHINITIS, SEASONAL, MILD 05/05/2010   URINARY INCONTINENCE, STRESS, FEMALE 05/05/2010    OSTEOARTHRITIS, HANDS, BILATERAL 05/05/2010   OSTEOARTHRITIS, CERVICAL SPINE 05/05/2010    Past Surgical History:  Procedure Laterality Date   ABDOMINAL HYSTERECTOMY     "they left my ovaries"   APPENDECTOMY     CARDIAC CATHETERIZATION  09/14/2017   COLONOSCOPY     DENTAL SURGERY Left 10/2016   dental implant   DILATION AND CURETTAGE OF UTERUS  X 4   ESOPHAGOGASTRODUODENOSCOPY     FLEXIBLE SIGMOIDOSCOPY N/A 06/21/2012   Procedure: FLEXIBLE SIGMOIDOSCOPY;  Surgeon: Jerene Bears, MD;  Location: [facility];  Service: Gastroenterology;  Laterality: N/A;   JOINT REPLACEMENT     LEFT HEART CATH AND CORONARY ANGIOGRAPHY N/A 09/14/2017   Procedure: LEFT HEART CATH AND CORONARY ANGIOGRAPHY;  Surgeon: Belva Crome, MD;  Location: [facility];  Service: Cardiovascular;  Laterality: N/A;   SHOULDER ARTHROSCOPY Right    "shaved spurs off rotator cuff"   TONSILLECTOMY     TOTAL HIP ARTHROPLASTY Right 06/09/2013   Procedure: TOTAL HIP ARTHROPLASTY;  Surgeon: Kerin Salen, MD;  Location: [facility];  Service: Orthopedics;  Laterality: Right;   TUBAL LIGATION     TUMOR EXCISION Right 1968   angle of jaw; benign     OB History   No obstetric history on file.      Home Medications    Prior to Admission medications   Medication Sig Start Date End Date Taking? Authorizing Provider  ALPRAZolam Duanne Moron) 0.5 MG tablet Take 1 tablet (0.5 mg total) by mouth 4 (four) times daily as needed for anxiety. 05/26/18   [name], PMHNP  atorvastatin (LIPITOR) 40 MG tablet Take 40 mg by mouth at bedtime.     [provider]  baclofen (LIORESAL) 10 MG tablet Take 20 mg by mouth 3 (three) times daily as needed for muscle spasms.    [provider]  BD PEN NEEDLE NANO U/F 32G X 4 MM MISC USE TO INJECT 5 TIMES DAILY AS DIRECTED 02/07/18   Elayne Snare, MD  cholecalciferol (VITAMIN D) 1000 units tablet Take 2,000 Units by mouth daily.     [provider]    cyclobenzaprine (FLEXERIL) 5 MG tablet Take 5 mg by mouth 3 (three) times daily as needed for muscle spasms.    [provider]  dicyclomine (BENTYL) 10 MG capsule Take 1 capsule (10 mg total) by mouth every 8 (eight) hours as needed for spasms. 12/29/16   Doran Stabler, MD  diphenoxylate-atropine (LOMOTIL) 2.5-0.025 MG tablet Take 1 tablet by mouth 2 (two) times daily as needed for diarrhea or loose stools. 05/31/18   Doran Stabler, MD  divalproex (DEPAKOTE ER) 250 MG 24 hr tablet Take 2 tablets (500 mg total) by mouth at bedtime. 03/14/18 06/12/18  [name], PMHNP  donepezil (ARICEPT) 10 MG  tablet Take 1 tablet at Bedtime 01/11/18   [name], PMHNP  DUREZOL 0.05 % EMUL Place 1 drop into the right eye 3 (three) times daily. 05/04/18   [provider]  gabapentin (NEURONTIN) 400 MG capsule Take 400 mg by mouth 3 (three) times daily.     [provider]  glucose blood (CONTOUR NEXT TEST) test strip Use to test blood sugar 2 times daily 04/28/18   Elayne Snare, MD  IBU 800 MG tablet Take 800 mg by mouth 3 (three) times daily as needed for pain. 02/10/17   [provider]  Insulin Glargine (LANTUS SOLOSTAR) 100 UNIT/ML Solostar Pen Inject 44 Units into the skin daily. INJECT 44 UNITS UNDER THE SKIN ONCE DAILY AT BEDTIME.    [provider]  insulin lispro (HUMALOG KWIKPEN) 100 UNIT/ML KwikPen Inject 10 Units into the skin 3 (three) times daily. INJECT 10 UNITS UNDER THE SKIN THREE TIMES DAILY BEFORE MEALS.    [provider]  Lancets 30G MISC 1 each by Does not apply route daily. 09/13/17   Elayne Snare, MD  levothyroxine (SYNTHROID, LEVOTHROID) 75 MCG tablet Take 1 tablet (75 mcg total) by mouth daily before breakfast. I tablet daily before breakfast 08/18/17   Elayne Snare, MD  lithium carbonate (ESKALITH) 450 MG CR tablet Take 1 tablet (450 mg total) by mouth at bedtime. 06/03/18   [name], PMHNP  losartan (COZAAR) 50 MG tablet  Take 25 mg by mouth daily.     [provider]  Melatonin 3 MG TABS Take 9 mg by mouth at bedtime.    [provider]  memantine (NAMENDA) 10 MG tablet Take 1 tablet (10 mg total) by mouth every evening. 01/11/18 04/11/18  [name], PMHNP  Mesalamine 800 MG TBEC Take 2 tablets (1,600 mg total) by mouth 2 (two) times daily for 3 days. 05/31/18 06/03/18  Doran Stabler, MD  nitroGLYCERIN (NITROSTAT) 0.4 MG SL tablet Place 1 tablet (0.4 mg total) under the tongue every 5 (five) minutes as needed for chest pain. 09/14/17   Lyda Jester M, PA-C  omega-3 acid ethyl esters (LOVAZA) 1 g capsule Take 1 g by mouth 2 (two) times daily.    [provider]  ondansetron (ZOFRAN ODT) 4 MG disintegrating tablet 4mg  ODT q4 hours prn nausea/vomit 05/19/18   Milton Ferguson, MD  ondansetron (ZOFRAN) 8 MG tablet Take 1 tablet (8 mg total) by mouth every 8 (eight) hours as needed for nausea. Patient not taking: Reported on 05/31/2018 01/01/18   Orlie Dakin, MD  polyethylene glycol [facility]) packet Take 17 g by mouth daily as needed. 11/20/16   Lavina Hamman, MD  QUEtiapine (SEROQUEL) 100 MG tablet Take 1.5 tabs po q am and 2 tabs po QHS 05/18/18   [name], PMHNP  rOPINIRole (REQUIP) 0.5 MG tablet Take 3 tablets (1.5 mg total) by mouth 2 (two) times daily. 06/03/18   [name], PMHNP  Semaglutide, 1 MG/DOSE, (OZEMPIC, 1 MG/DOSE,) 2 MG/1.5ML SOPN Inject 1 mg into the skin once a week. 05/09/18   Elayne Snare, MD  traMADol (ULTRAM) 50 MG tablet Take 1 tablet (50 mg total) by mouth every 6 (six) hours as needed for up to 20 doses. 06/06/18   Doran Stabler, MD  zaleplon (SONATA) 5 MG capsule Take 1 capsule (5 mg total) by mouth at bedtime. 04/14/18   [name], PMHNP    Family History Family History  Problem Relation Age of Onset  Drug abuse Mother    Alcohol abuse Mother    Alcohol abuse Father    Hypertension Father    CAD Brother    Hypertension  Brother    Alcohol abuse Brother    Hypertension Brother    Hypertension Brother    Psoriasis Daughter    Arthritis Daughter        psoriatic arthritis    Alcohol abuse Grandchild     Social History Social History   Tobacco Use   Smoking status: Never Smoker   Smokeless tobacco: Never Used  Substance Use Topics   Alcohol use: Not Currently    Comment: 09/14/2017 "nothing since 04/15/2008"   Drug use: Yes    Frequency: 7.0 times per week    Types: Benzodiazepines    Comment: Pt prescribed Xanax .5 mg, 4x a day     Allergies   Codeine; Macrolides and ketolides; Procaine hcl; Aspirin; Benadryl [diphenhydramine]; Diflucan [fluconazole]; Dilaudid [hydromorphone hcl]; Other; and Sulfa antibiotics   Review of Systems Review of Systems  Constitutional: Negative for chills, diaphoresis and fever.  HENT: Negative for ear pain, sore throat and trouble swallowing.   Eyes: Negative for pain and visual disturbance.  Respiratory: Negative for cough and shortness of breath.   Cardiovascular: Positive for chest pain. Negative for palpitations, orthopnea and PND.  Gastrointestinal: Positive for heartburn (burning in throat and hurting jaw). Negative for abdominal pain and vomiting.  Genitourinary: Negative for dysuria and hematuria.  Musculoskeletal: Negative for arthralgias and back pain.  Skin: Negative for color change and rash.  Neurological: Negative for dizziness, seizures, syncope and numbness.  All other systems reviewed and are negative.    Physical Exam Updated Vital Signs  ED Triage Vitals  Enc Vitals Group     BP 06/18/18 1217 137/81     Pulse Rate 06/18/18 1217 93     Resp 06/18/18 1217 20     Temp 06/18/18 1219 97.9 F (36.6 C)     Temp Source 06/18/18 1219 Oral     SpO2 06/18/18 1217 97 %     Weight 06/18/18 1315 171 lb (77.6 kg)     Height 06/18/18 1315 5\' 1"  (1.549 m)     Head Circumference --      Peak Flow --      Pain Score 06/18/18 1316 7      Pain Loc --      Pain Edu? --      Excl. in [place]? --     Physical Exam   ED Treatments / Results  Labs (all labs ordered are listed, but only abnormal results are displayed) Labs Reviewed  CBC WITH DIFFERENTIAL/PLATELET - Abnormal; Notable for the following components:      Result Value   Abs Immature Granulocytes 0.08 (*)    All other components within normal limits  BASIC METABOLIC PANEL - Abnormal; Notable for the following components:   Glucose, Bld 135 (*)    BUN 7 (*)    All other components within normal limits  TROPONIN I    EKG EKG Interpretation  Date/Time:  Saturday June 18 2018 12:16:56 EDT Ventricular Rate:  90 PR Interval:    QRS Duration: 83 QT Interval:  353 QTC Calculation: 432 R Axis:   66 Text Interpretation:  Sinus rhythm Low voltage, precordial leads Confirmed by Lennice Sites [phone]) on 06/18/2018 12:33:42 PM Also confirmed by Lennice Sites [phone]), editor Lynder Parents [phone])  on 06/18/2018 3:06:22 PM   Radiology Dg Chest Portable  1 View  Result Date: 06/18/2018 CLINICAL DATA:  Chest pain and shortness of breath starting last night, worse this morning. EXAM: PORTABLE CHEST 1 VIEW COMPARISON:  Chest x-ray dated 09/14/2017. FINDINGS: Heart size and mediastinal contours are within normal limits. Lungs are clear. No pleural effusion or pneumothorax seen. Stable elevation of the RIGHT hemidiaphragm. Osseous structures about the chest are unremarkable. IMPRESSION: No active disease. No evidence of pneumonia or pulmonary edema. Electronically Signed   By: Franki Cabot M.D.   On: 06/18/2018 13:12    Procedures Procedures (including critical care time)  Medications Ordered in ED Medications  alum & mag hydroxide-simeth (MAALOX/MYLANTA) 200-200-20 MG/5ML suspension 30 mL (30 mLs Oral Given 06/18/18 1313)    And  lidocaine (XYLOCAINE) 2 % viscous mouth solution 15 mL (15 mLs Oral Given 06/18/18 1313)  fentaNYL (SUBLIMAZE) injection 50 mcg (50 mcg  Intravenous Given 06/18/18 1426)     Initial Impression / Assessment and Plan / ED Course  I have reviewed the triage vital signs and the nursing notes.  Pertinent labs & imaging results that were available during my care of the patient were reviewed by me and considered in my medical decision making (see chart for details).   [name] [name] is a 73 year old female with history of bipolar disorder, chronic back pain, depression, fibromyalgia, irritable bowel syndrome, restless leg syndrome, diabetes, high cholesterol who presents to the ED with chest pain, sore throat, jaw pain.  Patient with normal vitals.  No fever.  Pain has been fairly persistent for the last 2 days.  Worse with movement of her upper arms.  Reproducible chest wall tenderness on exam.  States that her throat feels sore when she commutes.  Hurts when she opens and closes her mouth at times.  Currently does not have any jaw pain, no longer has any issues with her throat.  She is mostly concerned about her chest pain.  It appears overall musculoskeletal in nature.  She states she has a history of this before.  Was told that she has possibly esophageal spasms.  Patient had a heart catheterization less than a year ago that showed normal coronary arteries.  Chest pain is overall very atypical.  EKG shows sinus rhythm.  No ischemic changes.  Troponin within normal limits.  Overall given history and physical do not believe that this pain is cardiac in nature.  Patient had improvement of symptoms following fentanyl, GI cocktail.  No significant anemia, electrolyte abnormality, kidney injury.  No significant leukocytosis.  Chest x-ray showed no signs of pneumonia, pneumothorax, pleural effusion.  Overall appears that patient has musculoskeletal type pain.  Possibly reflux, possibly spasm.  Recommend continued use of her home tramadol, Tylenol, heating pad.  Given return precautions.  History and physical is not consistent with dissection or with  pulmonary emboli.  Recommend follow-up with primary care doctor.  This chart was dictated using voice recognition software.  Despite best efforts to proofread,  errors can occur which can change the documentation meaning.    Final Clinical Impressions(s) / ED Diagnoses   Final diagnoses:  Chest wall pain    ED Discharge Orders    None       Lennice Sites, DO 06/18/18 1527

## 2018-06-18 NOTE — ED Triage Notes (Signed)
Pt arrives POV from home c/o chest pain that radiates to jaw that started last night. Pt reports chest pain in the center of chest; per pt, no hx of MI. Pt states she has HTN, DM, HLD, and bipolar d/o. Pt states jaw pain "was like a stabbing pain" and cp feels like "tightness". Pt a&o x4.

## 2018-06-18 NOTE — ED Notes (Signed)
Phlebotomist at bedside.

## 2018-06-18 NOTE — ED Notes (Signed)
ED Provider at bedside. 

## 2018-06-23 ENCOUNTER — Ambulatory Visit: Payer: Medicare Other | Admitting: Psychiatry

## 2018-06-27 DIAGNOSIS — F319 Bipolar disorder, unspecified: Secondary | ICD-10-CM | POA: Diagnosis not present

## 2018-06-27 DIAGNOSIS — Z794 Long term (current) use of insulin: Secondary | ICD-10-CM | POA: Diagnosis not present

## 2018-06-27 DIAGNOSIS — K224 Dyskinesia of esophagus: Secondary | ICD-10-CM | POA: Diagnosis not present

## 2018-06-27 DIAGNOSIS — E119 Type 2 diabetes mellitus without complications: Secondary | ICD-10-CM | POA: Diagnosis not present

## 2018-06-30 ENCOUNTER — Other Ambulatory Visit: Payer: Self-pay

## 2018-06-30 ENCOUNTER — Ambulatory Visit
Admission: RE | Admit: 2018-06-30 | Discharge: 2018-06-30 | Disposition: A | Discharge status: Home / Self Care | Payer: Medicare Other | Source: Ambulatory Visit | Attending: Family Medicine | Admitting: Family Medicine

## 2018-06-30 ENCOUNTER — Other Ambulatory Visit: Payer: Self-pay | Admitting: Family Medicine

## 2018-06-30 ENCOUNTER — Telehealth: Payer: Self-pay | Admitting: Gastroenterology

## 2018-06-30 DIAGNOSIS — R109 Unspecified abdominal pain: Secondary | ICD-10-CM

## 2018-06-30 DIAGNOSIS — K5790 Diverticulosis of intestine, part unspecified, without perforation or abscess without bleeding: Secondary | ICD-10-CM | POA: Diagnosis not present

## 2018-06-30 DIAGNOSIS — K5792 Diverticulitis of intestine, part unspecified, without perforation or abscess without bleeding: Secondary | ICD-10-CM | POA: Diagnosis not present

## 2018-06-30 IMAGING — CR DG CHEST 2V
2 series · 2 of 2 positions shown · non-contrast
Comparison: December 31, 2015

CLINICAL DATA: Shortness of breath and fatigue teeth

EXAM:
CHEST  2 VIEW

[w chest pa]
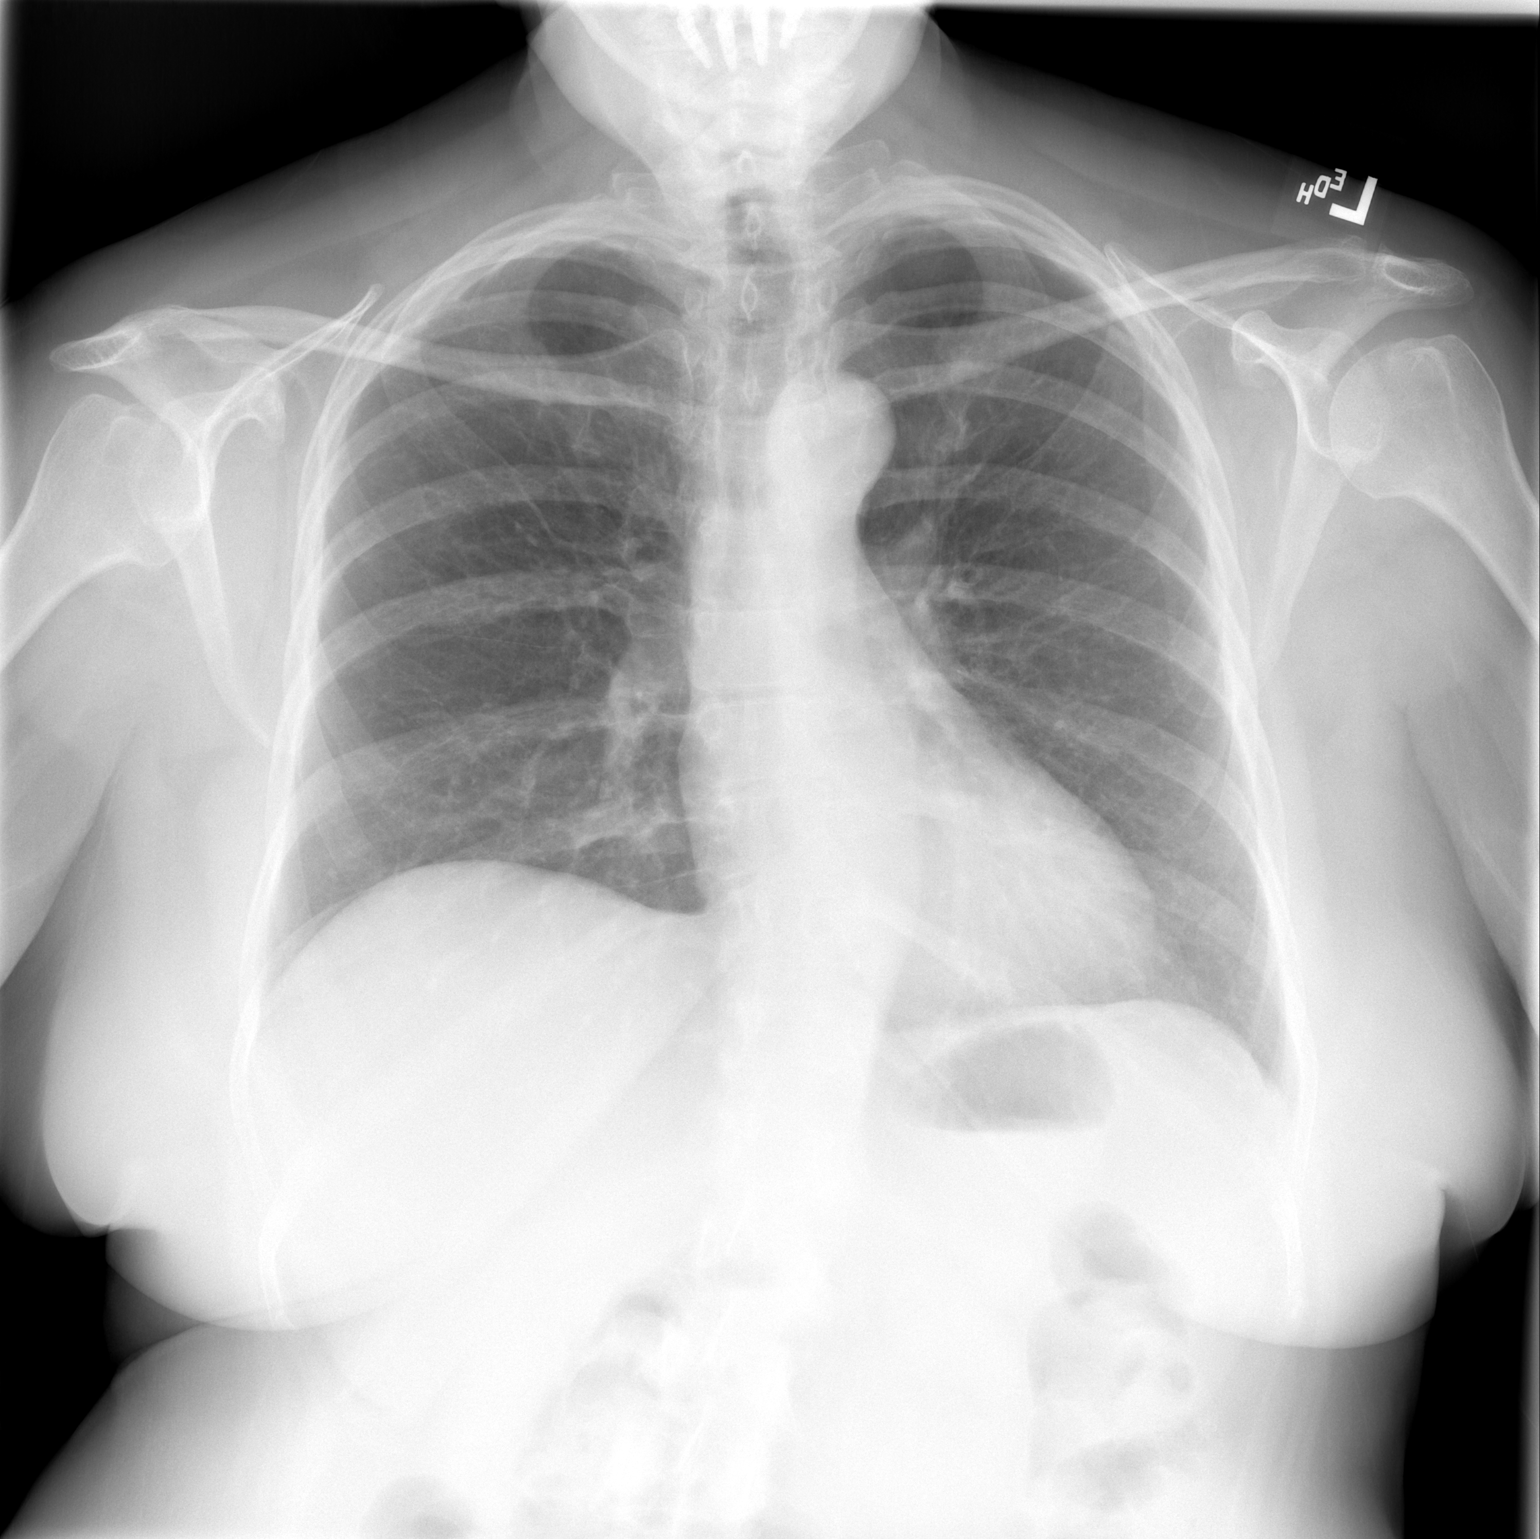

[w chest lat]
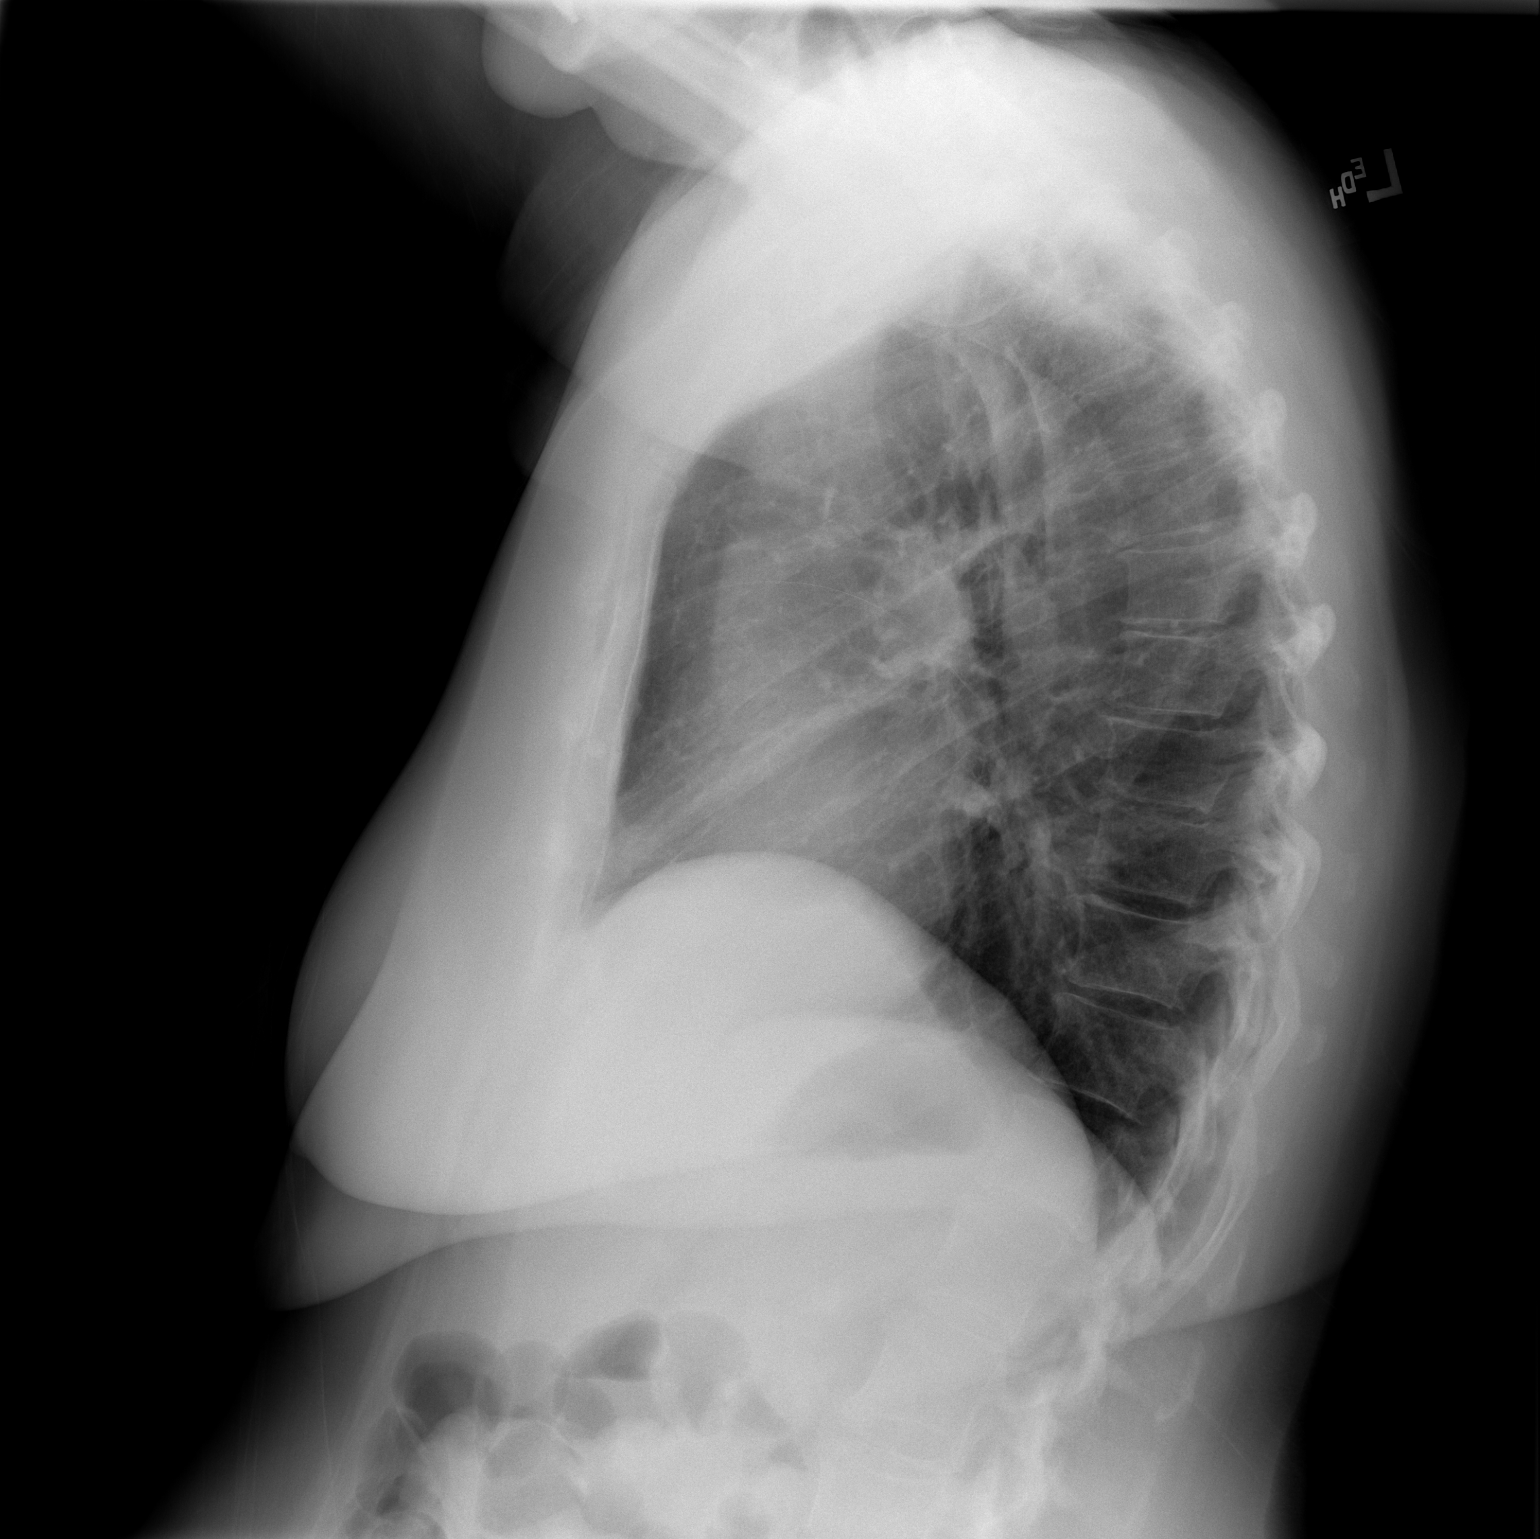

[2 of 2 positions shown; findings below may reference images not displayed]

FINDINGS: The heart size and mediastinal contours are within normal limits.
Both lungs are clear. The visualized skeletal structures are
unremarkable.
IMPRESSION: No active cardiopulmonary disease.

## 2018-06-30 MED ORDER — DICYCLOMINE HCL 10 MG PO CAPS: 10.0000 mg | ORAL_CAPSULE | Freq: Three times a day (TID) | ORAL | 0 refills | Status: DC | PRN

## 2018-06-30 MED ORDER — IOPAMIDOL (ISOVUE-300) INJECTION 61%
100.0000 mL | Freq: Once | INTRAVENOUS | Status: AC | PRN
  Administered 2018-06-30: 12:00:00 100 mL via INTRAVENOUS

## 2018-06-30 MED ORDER — MESALAMINE 800 MG PO TBEC: 1600.0000 mg | DELAYED_RELEASE_TABLET | Freq: Three times a day (TID) | ORAL | 0 refills | Status: DC

## 2018-06-30 NOTE — Telephone Encounter (Signed)
Left message for pt to call back. Pts PCP ordered the CT.  Spoke with pt and she is aware of Dr. Loletha Carrow' comments and recommendations.

## 2018-06-30 NOTE — Telephone Encounter (Signed)
Sorry to hear she is not feeling well.  I am out of the office today and our office is closed tomorrow, so I am afraid I cannot see her.  Looks like she went for a CT scan of the abdomen today, though I do not know who sent her for that.  There is not clear diverticulitis on there.  Given that and her many medicine allergies, I am not going to prescribe antibiotics.  I sent a new prescriptions for mesalamine at higher dose as well as dicyclomine.

## 2018-06-30 NOTE — Telephone Encounter (Signed)
Patient thinks she having diverticulosis and said she is in a lot of pain. Would like to speak to someone.

## 2018-06-30 NOTE — Telephone Encounter (Signed)
Pt states she thinks she is having a diverticulitis flare. Reports she started having abdominal pain on her right side last night, states it is constant and rates it at an 8 on scale of 1-10. Pt reports the pain is a stabbing, cramping pain. Pt states she does not have a fever. Please advise.

## 2018-07-04 ENCOUNTER — Other Ambulatory Visit: Payer: Self-pay

## 2018-07-04 ENCOUNTER — Other Ambulatory Visit (INDEPENDENT_AMBULATORY_CARE_PROVIDER_SITE_OTHER): Payer: Medicare Other

## 2018-07-04 ENCOUNTER — Telehealth: Payer: Self-pay | Admitting: Psychiatry

## 2018-07-04 DIAGNOSIS — M4316 Spondylolisthesis, lumbar region: Secondary | ICD-10-CM | POA: Diagnosis not present

## 2018-07-04 DIAGNOSIS — E039 Hypothyroidism, unspecified: Secondary | ICD-10-CM

## 2018-07-04 DIAGNOSIS — Z794 Long term (current) use of insulin: Secondary | ICD-10-CM

## 2018-07-04 DIAGNOSIS — E1165 Type 2 diabetes mellitus with hyperglycemia: Secondary | ICD-10-CM

## 2018-07-04 LAB — BASIC METABOLIC PANEL
BUN: 14 mg/dL (ref 6–23)
CO2: 27 mEq/L (ref 19–32)
Calcium: 10.6 mg/dL — ABNORMAL HIGH (ref 8.4–10.5)
Chloride: 104 mEq/L (ref 96–112)
Creatinine, Ser: 0.94 mg/dL (ref 0.40–1.20)
GFR: 58.79 mL/min — ABNORMAL LOW (ref >60.00)
Glucose, Bld: 151 mg/dL — ABNORMAL HIGH (ref 70–99)
Potassium: 4.5 mEq/L (ref 3.5–5.1)
Sodium: 139 mEq/L (ref 135–145)

## 2018-07-04 LAB — T4, FREE: Free T4: 0.77 ng/dL (ref 0.60–1.60)

## 2018-07-04 LAB — T3, FREE: T3, Free: 2.5 pg/mL (ref 2.3–4.2)

## 2018-07-04 LAB — TSH: TSH: 1.35 u[IU]/mL (ref 0.35–4.50)

## 2018-07-04 NOTE — Telephone Encounter (Signed)
Left voicemail with information, will follow up with pt tomorrow

## 2018-07-04 NOTE — Telephone Encounter (Signed)
[name] [name] called to report that she is not sleeping.Her appt isn't until next week 07/13/18 so would like to know if you would prescribe something for sleep.  [facility] - [address]

## 2018-07-05 LAB — FRUCTOSAMINE: Fructosamine: 220 umol/L (ref 0–285)

## 2018-07-05 NOTE — Telephone Encounter (Signed)
Pt given information this morning, she didn't try it last night due to eating late. She will try it tonight and make sure her xanax and sonata are not close together. Instructed to call back with update.

## 2018-07-07 NOTE — Progress Notes (Signed)
Patient ID: [name] [name], female   DOB: Jul 26, 1945, 73 y.o.   MRN: [id number]            Reason for Appointment: Follow-up for Type 2 Diabetes   Today's office visit was provided via telemedicine using video technique Explained to the patient and the the limitations of evaluation and management by telemedicine and the availability of in person appointments.  The patient understood the limitations and agreed to proceed. Patient also understood that the telehealth visit is billable. . Location of the patient: Home . Location of the provider: Office Only the patient and myself were participating in the encounter    History of Present Illness:          Date of diagnosis of type 2 diabetes mellitus: ?  2014        Background history:   She thinks her blood sugar was 400 at the time of diagnosis but no detailed records of this are available She did have an A1c of 10.6 done in 2014 and was probably given Lantus for some time initially Apparently she was treated with various medications including metformin, Janumet and Tradgenta. Her blood sugars had improved and A1c in 2015 was down to 6.2 She tends to have diarrhea with metformin and Janumet and also she thinks it causes dry mouth Most likely Janumet was stopped in 06/2015 Glipizide was started in 8/17 when blood sugars were higher and A1c was 9%  Recent history:   INSULIN regimen is:   Lantus 44 units daily at 5 pm  Humalog: taking 8 units 0-3 times daily  Non-insulin hypoglycemic drugs the patient is taking are: Ozempic1 mg weekly  Her A1c was last 7.5% Fructosamine is 220  Current management, blood sugar patterns and problems identified:  She had difficulty with controlling her diet, portions and snacking and was told to go up to 1 mg on her Ozempic in early March  Her blood sugars overall are looking better, previously averaging 177  However she is checking her blood sugars very regularly and has only 1 reading in the  last 10 days  FASTING readings are mostly high with only one good reading about 3 weeks ago  She thinks her sugars are higher when she checks them before breakfast because of eating during the night  Also fasting in the lab was 151  She says she is trying to be active but not doing a lot of formal walking, may be every other day  She has only a couple of readings late at night which are not high but not checking readings 2 hours after dinner consistently or after other meals  Not clear if her weight has come down  Usually not feeling hypoglycemic   Side effects from medications have been:?  Diarrhea from metformin and Janumet  Compliance with the medical regimen: Inconsistent  Glucose monitoring:  done 1 time a day or less        Glucometer: Contour        Blood Glucose readings by time of day from the patient reading off her blood sugars:  PRE-MEAL Fasting Lunch Dinner Bedtime Overall  Glucose range:  118-182      Mean/median:     ?   POST-MEAL PC Breakfast PC Lunch PC Dinner  Glucose range:    113-136  Mean/median:         Self-care: The diet that the patient has been following HY:WVPX, tries to limit drinks with sugar .  Typical meal intake: Breakfast at 9,  May be eggs and sausage and lunch is usually a sandwich   Dinner 7 pm               Dietician visit, most recent: 10/18               Exercise:   Weight history:  Wt Readings from Last 3 Encounters:  06/18/18 171 lb (77.6 kg)  05/31/18 172 lb (78 kg)  05/19/18 170 lb (77.1 kg)    Glycemic control:   Lab Results  Component Value Date   HGBA1C 7.5 (H) 05/04/2018   HGBA1C 7.0 (H) 12/21/2017   HGBA1C 6.3 09/17/2017   Lab Results  Component Value Date   MICROALBUR <0.7 05/04/2018   CREATININE 0.94 07/04/2018   Lab Results  Component Value Date   MICRALBCREAT 1.9 05/04/2018       Allergies as of 07/08/2018      Reactions   Codeine Anxiety, Other (See Comments)   Reaction:  Hallucinations    Macrolides And Ketolides Nausea And Vomiting   Procaine Hcl Palpitations   Aspirin Nausea And Vomiting, Other (See Comments)   Reaction:  Burns pts stomach    Benadryl [diphenhydramine] Other (See Comments)   Per MD "inhibits potency of gabapentin, lithium etc"   Diflucan [fluconazole] Other (See Comments)   Reaction:  Unknown    Dilaudid [hydromorphone Hcl] Other (See Comments)   Reaction:  Migraines and nightmares    Other Nausea And Vomiting, Other (See Comments)   Pt states that all -mycins cause N/V.    Sulfa Antibiotics Other (See Comments)   Reaction:  Unknown       Medication List       Accurate as of July 07, 2018  4:20 PM. Always use your most recent med list.        ALPRAZolam 0.5 MG tablet Commonly known as:  XANAX Take 1 tablet (0.5 mg total) by mouth 4 (four) times daily as needed for anxiety.   atorvastatin 40 MG tablet Commonly known as:  LIPITOR Take 40 mg by mouth at bedtime.   baclofen 10 MG tablet Commonly known as:  LIORESAL Take 20 mg by mouth 3 (three) times daily as needed for muscle spasms.   BD Pen Needle Nano U/F 32G X 4 MM Misc Generic drug:  Insulin Pen Needle USE TO INJECT 5 TIMES DAILY AS DIRECTED   cholecalciferol 1000 units tablet Commonly known as:  VITAMIN D Take 2,000 Units by mouth daily.   cyclobenzaprine 5 MG tablet Commonly known as:  FLEXERIL Take 5 mg by mouth 3 (three) times daily as needed for muscle spasms.   dicyclomine 10 MG capsule Commonly known as:  BENTYL Take 1 capsule (10 mg total) by mouth every 8 (eight) hours as needed for spasms.   diphenoxylate-atropine 2.5-0.025 MG tablet Commonly known as:  Lomotil Take 1 tablet by mouth 2 (two) times daily as needed for diarrhea or loose stools.   divalproex 250 MG 24 hr tablet Commonly known as:  DEPAKOTE ER Take 2 tablets (500 mg total) by mouth at bedtime.   donepezil 10 MG tablet Commonly known as:  ARICEPT Take 1 tablet at Bedtime   Durezol 0.05 % Emul  Generic drug:  Difluprednate Place 1 drop into the right eye 3 (three) times daily.   gabapentin 400 MG capsule Commonly known as:  NEURONTIN Take 400 mg by mouth 3 (three) times daily.   glucose blood test strip Commonly  known as:  Contour Next Test Use to test blood sugar 2 times daily   HumaLOG KwikPen 100 UNIT/ML KwikPen Generic drug:  insulin lispro Inject 10 Units into the skin 3 (three) times daily. INJECT 10 UNITS UNDER THE SKIN THREE TIMES DAILY BEFORE MEALS.   IBU 800 MG tablet Generic drug:  ibuprofen Take 800 mg by mouth 3 (three) times daily as needed for pain.   Lancets 30G Misc 1 each by Does not apply route daily.   Lantus SoloStar 100 UNIT/ML Solostar Pen Generic drug:  Insulin Glargine Inject 44 Units into the skin daily. INJECT 44 UNITS UNDER THE SKIN ONCE DAILY AT BEDTIME.   levothyroxine 75 MCG tablet Commonly known as:  SYNTHROID Take 1 tablet (75 mcg total) by mouth daily before breakfast. I tablet daily before breakfast   lithium carbonate 450 MG CR tablet Commonly known as:  ESKALITH Take 1 tablet (450 mg total) by mouth at bedtime.   losartan 50 MG tablet Commonly known as:  COZAAR Take 25 mg by mouth daily.   Melatonin 3 MG Tabs Take 9 mg by mouth at bedtime.   memantine 10 MG tablet Commonly known as:  NAMENDA Take 1 tablet (10 mg total) by mouth every evening.   Mesalamine 800 MG Tbec Take 2 tablets (1,600 mg total) by mouth 3 (three) times daily for 3 days.   nitroGLYCERIN 0.4 MG SL tablet Commonly known as:  NITROSTAT Place 1 tablet (0.4 mg total) under the tongue every 5 (five) minutes as needed for chest pain.   omega-3 acid ethyl esters 1 g capsule Commonly known as:  LOVAZA Take 1 g by mouth 2 (two) times daily.   ondansetron 4 MG disintegrating tablet Commonly known as:  Zofran ODT 4mg  ODT q4 hours prn nausea/vomit   ondansetron 8 MG tablet Commonly known as:  Zofran Take 1 tablet (8 mg total) by mouth every 8 (eight)  hours as needed for nausea.   polyethylene glycol 17 g packet Commonly known as:  MiraLax Take 17 g by mouth daily as needed.   QUEtiapine 100 MG tablet Commonly known as:  SEROquel Take 1.5 tabs po q am and 2 tabs po QHS   rOPINIRole 0.5 MG tablet Commonly known as:  REQUIP Take 3 tablets (1.5 mg total) by mouth 2 (two) times daily.   Semaglutide (1 MG/DOSE) 2 MG/1.5ML Sopn Commonly known as:  Ozempic (1 MG/DOSE) Inject 1 mg into the skin once a week.   traMADol 50 MG tablet Commonly known as:  ULTRAM Take 1 tablet (50 mg total) by mouth every 6 (six) hours as needed for up to 20 doses.   zaleplon 5 MG capsule Commonly known as:  SONATA Take 1 capsule (5 mg total) by mouth at bedtime.       Allergies:  Allergies  Allergen Reactions  . Codeine Anxiety and Other (See Comments)    Reaction:  Hallucinations  . Macrolides And Ketolides Nausea And Vomiting  . Procaine Hcl Palpitations  . Aspirin Nausea And Vomiting and Other (See Comments)    Reaction:  Burns pts stomach   . Benadryl [Diphenhydramine] Other (See Comments)    Per MD "inhibits potency of gabapentin, lithium etc"  . Diflucan [Fluconazole] Other (See Comments)    Reaction:  Unknown   . Dilaudid [Hydromorphone Hcl] Other (See Comments)    Reaction:  Migraines and nightmares   . Other Nausea And Vomiting and Other (See Comments)    Pt states that all -mycins  cause N/V.   Marland Kitchen Sulfa Antibiotics Other (See Comments)    Reaction:  Unknown     Past Medical History:  Diagnosis Date  . Alcohol abuse   . Anxiety    takes Valium daily as needed and Ativan daily  . Bilateral hearing loss   . Bipolar 1 disorder ([place])    takes Lithium nightly and Synthroid daily  . Chronic back pain    DDD; "all over" (09/14/2017)  . Colitis, ischemic ([place]) 2012  . Confusion    r/t meds  . Depression    takes Prozac daily and Bupspirone   . Diverticulosis   . Dyslipidemia    takes Crestor daily  . Fibromyalgia   . Headache     "weekly" (09/14/2017)  . Hepatic steatosis 06/18/12   severe  . History of kidney stones   . Hyperlipidemia   . Hypertension   . Hypothyroidism   . IBS (irritable bowel syndrome)   . Ischemic colitis ([place])   . Joint pain   . Joint swelling   . Lupus erythematosus tumidus    tumid-skin  . Migraine    "1-2/yr; maybe" (09/14/2017)  . Numbness    in right foot  . Osteoarthritis    "all over" (09/14/2017)  . Osteoarthritis cervical spine   . Osteoarthritis of hand    bilateral  . Pneumonia    "walking pneumonia several times; long time since the last time" (09/14/2017)  . Restless leg syndrome    takes Requip nightly  . Sciatica   . Type II diabetes mellitus ([place])   . Urinary frequency   . Urinary leakage   . Urinary urgency   . Urinary, incontinence, stress female   . Walking pneumonia    last time more than 24yrs ago    Past Surgical History:  Procedure Laterality Date  . ABDOMINAL HYSTERECTOMY     "they left my ovaries"  . APPENDECTOMY    . CARDIAC CATHETERIZATION  09/14/2017  . COLONOSCOPY    . DENTAL SURGERY Left 10/2016   dental implant  . DILATION AND CURETTAGE OF UTERUS  X 4  . ESOPHAGOGASTRODUODENOSCOPY    . FLEXIBLE SIGMOIDOSCOPY N/A 06/21/2012   Procedure: FLEXIBLE SIGMOIDOSCOPY;  Surgeon: Jerene Bears, MD;  Location: [facility];  Service: Gastroenterology;  Laterality: N/A;  . JOINT REPLACEMENT    . LEFT HEART CATH AND CORONARY ANGIOGRAPHY N/A 09/14/2017   Procedure: LEFT HEART CATH AND CORONARY ANGIOGRAPHY;  Surgeon: Belva Crome, MD;  Location: [facility];  Service: Cardiovascular;  Laterality: N/A;  . SHOULDER ARTHROSCOPY Right    "shaved spurs off rotator cuff"  . TONSILLECTOMY    . TOTAL HIP ARTHROPLASTY Right 06/09/2013   Procedure: TOTAL HIP ARTHROPLASTY;  Surgeon: Kerin Salen, MD;  Location: [facility];  Service: Orthopedics;  Laterality: Right;  . TUBAL LIGATION    . TUMOR EXCISION Right 1968   angle of jaw; benign    Family History  Problem  Relation Age of Onset  . Drug abuse Mother   . Alcohol abuse Mother   . Alcohol abuse Father   . Hypertension Father   . CAD Brother   . Hypertension Brother   . Alcohol abuse Brother   . Hypertension Brother   . Hypertension Brother   . Psoriasis Daughter   . Arthritis Daughter        psoriatic arthritis   . Alcohol abuse Grandchild     Social History:  reports that she has  never smoked. She has never used smokeless tobacco. She reports previous alcohol use. She reports current drug use. Frequency: 7.00 times per week. Drug: Benzodiazepines.   Review of Systems    Lipid history: On Lipitor 40 mg prescribed by her PCP Last LDL was high and now just above 100  Triglycerides  high and were relatively high but now relatively better She is not always watching her diet On fish oil ? 2 daily   Lab Results  Component Value Date   CHOL 178 05/04/2018   HDL 64.40 05/04/2018   LDLDIRECT 103.0 05/04/2018   TRIG 240.0 (H) 05/04/2018   CHOLHDL 3 05/04/2018            Most recent eye exam was In 03/2018, negative  Most recent foot exam:03/2016  THYROID: She has been on levothyroxine and Cytomel from her psychiatrist for several years with uncertain diagnosis Currently taking levothyroxine 75 g, also on Cytomel 25 g Overall feeling fairly good now  Also on lithium  Her TSH is fairly consistently near 1.0 now Free T3 and free T4 are normal  Labs as follows:  Lab Results  Component Value Date   TSH 1.35 07/04/2018   TSH 1.09 12/21/2017   TSH 3.86 03/22/2017   FREET4 0.77 07/04/2018   FREET4 0.65 12/21/2017   FREET4 1.03 03/22/2017   Lab Results  Component Value Date   T3FREE 2.5 07/04/2018   T3FREE 2.3 12/21/2017   T3FREE 3.0 03/22/2017   T3FREE 3.2 07/10/2016   T3FREE 2.7 04/30/2016    She has atypical depression on multiple drugs, has been on Seroquel for quite some time    Physical Examination:  There were no vitals taken for this visit.          ASSESSMENT:  Diabetes type 2, on insulin  See history of present illness for detailed discussion of current diabetes management, blood sugar patterns and problems identified  She is on Ozempic, basal and bolus insulin.  Her fructosamine of 220 indicates improved control  Previously had progressively higher A1c  Although she has not had any steroids to raise her sugar she has  irregular eating habits and tending to eat snacks during the night which cause her morning sugars to be higher Overall blood sugar control appears to be better with increasing Ozempic also Not clear if her weight is changed She has not done much formal walking even though she can  She is also not consistently compliant with taking her Humalog at meals and this was discussed  Hypothyroidism, likely secondary: Thyroid levels are normal again  She is on a combination of Cytomel and levothyroxine as before and will continue  HYPERCALCEMIA: Does not appear to be medication related and has not been consistently high, previously not evaluated   PLAN:  She will go up at least 2 units on her Lantus to 46 After 1 week if most of her readings are over 130 she will go up to 48 units More consistent monitoring after meals She will need to take Humalog anytime she has a full meal  Recheck calcium on the next visit along with PTH  Follow-up in 3 months  There are no Patient Instructions on file for this visit.        Elayne Snare 07/07/2018, 4:20 PM   Note: This office note was prepared with Dragon voice recognition system technology. Any transcriptional errors that result from this process are unintentional.

## 2018-07-08 ENCOUNTER — Other Ambulatory Visit: Payer: Self-pay

## 2018-07-08 ENCOUNTER — Encounter: Payer: Self-pay | Admitting: Endocrinology

## 2018-07-08 ENCOUNTER — Ambulatory Visit (INDEPENDENT_AMBULATORY_CARE_PROVIDER_SITE_OTHER): Payer: Medicare Other | Admitting: Endocrinology

## 2018-07-08 DIAGNOSIS — E1165 Type 2 diabetes mellitus with hyperglycemia: Secondary | ICD-10-CM

## 2018-07-08 DIAGNOSIS — Z794 Long term (current) use of insulin: Secondary | ICD-10-CM

## 2018-07-08 NOTE — Patient Instructions (Signed)
48

## 2018-07-13 ENCOUNTER — Encounter: Payer: Self-pay | Admitting: Psychiatry

## 2018-07-13 ENCOUNTER — Ambulatory Visit (INDEPENDENT_AMBULATORY_CARE_PROVIDER_SITE_OTHER): Payer: Medicare Other | Admitting: Psychiatry

## 2018-07-13 ENCOUNTER — Other Ambulatory Visit: Payer: Self-pay

## 2018-07-13 DIAGNOSIS — F319 Bipolar disorder, unspecified: Secondary | ICD-10-CM

## 2018-07-13 DIAGNOSIS — F411 Generalized anxiety disorder: Secondary | ICD-10-CM | POA: Diagnosis not present

## 2018-07-13 DIAGNOSIS — Z79899 Other long term (current) drug therapy: Secondary | ICD-10-CM | POA: Diagnosis not present

## 2018-07-13 DIAGNOSIS — F5101 Primary insomnia: Secondary | ICD-10-CM | POA: Diagnosis not present
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
## 2018-07-20 DIAGNOSIS — Z03818 Encounter for observation for suspected exposure to other biological agents ruled out: Secondary | ICD-10-CM | POA: Diagnosis not present

## 2018-07-20 DIAGNOSIS — R42 Dizziness and giddiness: Secondary | ICD-10-CM | POA: Diagnosis not present

## 2018-07-20 DIAGNOSIS — Z794 Long term (current) use of insulin: Secondary | ICD-10-CM | POA: Diagnosis not present

## 2018-07-20 DIAGNOSIS — M797 Fibromyalgia: Secondary | ICD-10-CM | POA: Diagnosis not present

## 2018-07-20 DIAGNOSIS — E1165 Type 2 diabetes mellitus with hyperglycemia: Secondary | ICD-10-CM | POA: Diagnosis not present

## 2018-07-20 DIAGNOSIS — R0602 Shortness of breath: Secondary | ICD-10-CM | POA: Diagnosis not present

## 2018-07-20 DIAGNOSIS — R5383 Other fatigue: Secondary | ICD-10-CM | POA: Diagnosis not present

## 2018-07-20 DIAGNOSIS — F319 Bipolar disorder, unspecified: Secondary | ICD-10-CM | POA: Diagnosis not present

## 2018-07-22 DIAGNOSIS — R5383 Other fatigue: Secondary | ICD-10-CM | POA: Diagnosis not present

## 2018-08-12 ENCOUNTER — Telehealth: Payer: Self-pay | Admitting: Endocrinology

## 2018-08-12 ENCOUNTER — Other Ambulatory Visit: Payer: Self-pay

## 2018-08-12 MED ORDER — SEMAGLUTIDE (1 MG/DOSE) 2 MG/1.5ML ~~LOC~~ SOPN: 1.0000 mg | PEN_INJECTOR | SUBCUTANEOUS | 3 refills | Status: DC

## 2018-08-12 NOTE — Telephone Encounter (Signed)
MEDICATION: Ozempic   PHARMACY:  [facility]- [place], [place]-    IS PATIENT OUT OF MEDICATION: yes, has been out for 1 week   IF NOT; HOW MUCH IS LEFT:   LAST APPOINTMENT DATE: @5 /03/2018  NEXT APPOINTMENT DATE:@8 /18/2020  DO WE HAVE YOUR PERMISSION TO LEAVE A DETAILED MESSAGE:  OTHER COMMENTS:  Patient stated that her dosage has been changed to1 instead of .5   **Let patient know to contact pharmacy at the end of the day to make sure medication is ready. **  ** Please notify patient to allow 48-72 hours to process**  **Encourage patient to contact the pharmacy for refills or they can request refills through [facility]**

## 2018-08-12 NOTE — Telephone Encounter (Signed)
Rx sent 

## 2018-08-29 DIAGNOSIS — E119 Type 2 diabetes mellitus without complications: Secondary | ICD-10-CM | POA: Diagnosis not present

## 2018-08-29 DIAGNOSIS — M545 Low back pain: Secondary | ICD-10-CM | POA: Diagnosis not present

## 2018-08-30 ENCOUNTER — Ambulatory Visit: Payer: Medicare Other | Admitting: Psychiatry

## 2018-08-31 ENCOUNTER — Telehealth: Payer: Self-pay | Admitting: Psychiatry

## 2018-08-31 DIAGNOSIS — M5136 Other intervertebral disc degeneration, lumbar region: Secondary | ICD-10-CM | POA: Diagnosis not present

## 2018-08-31 DIAGNOSIS — N183 Chronic kidney disease, stage 3 (moderate): Secondary | ICD-10-CM | POA: Diagnosis not present

## 2018-08-31 NOTE — Telephone Encounter (Signed)
[name] [name] called to report that she had gone to the Urgent Care yesterday and they blood work and found that her kidney functions were not good at all.  Could be the Lithium.  Please call to discuss.  Will ask for labs to sent to [place], but really needs to discuss this.  Next appt 09/22/18

## 2018-09-03 ENCOUNTER — Other Ambulatory Visit: Payer: Self-pay | Admitting: Psychiatry

## 2018-09-05 DIAGNOSIS — M899 Disorder of bone, unspecified: Secondary | ICD-10-CM | POA: Diagnosis not present

## 2018-09-05 DIAGNOSIS — M898X6 Other specified disorders of bone, lower leg: Secondary | ICD-10-CM | POA: Diagnosis not present

## 2018-09-05 DIAGNOSIS — M25562 Pain in left knee: Secondary | ICD-10-CM | POA: Diagnosis not present

## 2018-09-08 DIAGNOSIS — M899 Disorder of bone, unspecified: Secondary | ICD-10-CM | POA: Diagnosis not present

## 2018-09-09 ENCOUNTER — Other Ambulatory Visit: Payer: Self-pay | Admitting: Psychiatry

## 2018-09-09 DIAGNOSIS — F319 Bipolar disorder, unspecified: Secondary | ICD-10-CM

## 2018-09-09 DIAGNOSIS — F5101 Primary insomnia: Secondary | ICD-10-CM

## 2018-09-13 DIAGNOSIS — Z01818 Encounter for other preprocedural examination: Secondary | ICD-10-CM | POA: Diagnosis not present

## 2018-09-13 DIAGNOSIS — Z1159 Encounter for screening for other viral diseases: Secondary | ICD-10-CM | POA: Diagnosis not present

## 2018-09-13 DIAGNOSIS — M899 Disorder of bone, unspecified: Secondary | ICD-10-CM | POA: Diagnosis not present

## 2018-09-18 ENCOUNTER — Other Ambulatory Visit: Payer: Self-pay | Admitting: Endocrinology

## 2018-09-19 DIAGNOSIS — M958 Other specified acquired deformities of musculoskeletal system: Secondary | ICD-10-CM | POA: Diagnosis not present

## 2018-09-19 DIAGNOSIS — M94262 Chondromalacia, left knee: Secondary | ICD-10-CM | POA: Diagnosis not present

## 2018-09-19 DIAGNOSIS — S83232A Complex tear of medial meniscus, current injury, left knee, initial encounter: Secondary | ICD-10-CM | POA: Diagnosis not present

## 2018-09-19 DIAGNOSIS — M87852 Other osteonecrosis, left femur: Secondary | ICD-10-CM | POA: Diagnosis not present

## 2018-09-19 DIAGNOSIS — M65862 Other synovitis and tenosynovitis, left lower leg: Secondary | ICD-10-CM | POA: Diagnosis not present

## 2018-09-19 DIAGNOSIS — M23362 Other meniscus derangements, other lateral meniscus, left knee: Secondary | ICD-10-CM | POA: Diagnosis not present

## 2018-09-19 DIAGNOSIS — S83242A Other tear of medial meniscus, current injury, left knee, initial encounter: Secondary | ICD-10-CM | POA: Diagnosis not present

## 2018-09-19 DIAGNOSIS — M23322 Other meniscus derangements, posterior horn of medial meniscus, left knee: Secondary | ICD-10-CM | POA: Diagnosis not present

## 2018-09-19 DIAGNOSIS — M23232 Derangement of other medial meniscus due to old tear or injury, left knee: Secondary | ICD-10-CM | POA: Diagnosis not present

## 2018-09-22 ENCOUNTER — Encounter

## 2018-09-22 ENCOUNTER — Ambulatory Visit (INDEPENDENT_AMBULATORY_CARE_PROVIDER_SITE_OTHER): Payer: Medicare Other | Admitting: Psychiatry

## 2018-09-22 ENCOUNTER — Other Ambulatory Visit: Payer: Self-pay

## 2018-09-22 DIAGNOSIS — R69 Illness, unspecified: Secondary | ICD-10-CM

## 2018-09-22 NOTE — Progress Notes (Signed)
[name] [name] [id number] 11-02-1945 73 y.o.    Attempted to reach patient multiple times for scheduled tele-visit.  No answer and call went directly to voicemail each time.  Left message recommending that patient contact office to reschedule.

## 2018-09-24 DIAGNOSIS — I10 Essential (primary) hypertension: Secondary | ICD-10-CM | POA: Diagnosis not present

## 2018-09-24 DIAGNOSIS — M797 Fibromyalgia: Secondary | ICD-10-CM | POA: Diagnosis not present

## 2018-09-24 DIAGNOSIS — E1142 Type 2 diabetes mellitus with diabetic polyneuropathy: Secondary | ICD-10-CM | POA: Diagnosis not present

## 2018-09-24 DIAGNOSIS — F319 Bipolar disorder, unspecified: Secondary | ICD-10-CM | POA: Diagnosis not present

## 2018-09-24 DIAGNOSIS — M879 Osteonecrosis, unspecified: Secondary | ICD-10-CM | POA: Diagnosis not present

## 2018-09-24 DIAGNOSIS — G2581 Restless legs syndrome: Secondary | ICD-10-CM | POA: Diagnosis not present

## 2018-09-24 DIAGNOSIS — E039 Hypothyroidism, unspecified: Secondary | ICD-10-CM | POA: Diagnosis not present

## 2018-09-24 DIAGNOSIS — S83242D Other tear of medial meniscus, current injury, left knee, subsequent encounter: Secondary | ICD-10-CM | POA: Diagnosis not present

## 2018-09-24 DIAGNOSIS — M47819 Spondylosis without myelopathy or radiculopathy, site unspecified: Secondary | ICD-10-CM | POA: Diagnosis not present

## 2018-09-24 DIAGNOSIS — S83272D Complex tear of lateral meniscus, current injury, left knee, subsequent encounter: Secondary | ICD-10-CM | POA: Diagnosis not present

## 2018-09-24 DIAGNOSIS — K589 Irritable bowel syndrome without diarrhea: Secondary | ICD-10-CM | POA: Diagnosis not present

## 2018-09-24 DIAGNOSIS — M659 Synovitis and tenosynovitis, unspecified: Secondary | ICD-10-CM | POA: Diagnosis not present

## 2018-09-24 DIAGNOSIS — M1611 Unilateral primary osteoarthritis, right hip: Secondary | ICD-10-CM | POA: Diagnosis not present

## 2018-09-24 DIAGNOSIS — L930 Discoid lupus erythematosus: Secondary | ICD-10-CM | POA: Diagnosis not present

## 2018-09-24 DIAGNOSIS — Z794 Long term (current) use of insulin: Secondary | ICD-10-CM | POA: Diagnosis not present

## 2018-09-24 DIAGNOSIS — E785 Hyperlipidemia, unspecified: Secondary | ICD-10-CM | POA: Diagnosis not present

## 2018-09-24 DIAGNOSIS — F341 Dysthymic disorder: Secondary | ICD-10-CM | POA: Diagnosis not present

## 2018-09-27 DIAGNOSIS — M879 Osteonecrosis, unspecified: Secondary | ICD-10-CM | POA: Diagnosis not present

## 2018-09-27 DIAGNOSIS — S83242D Other tear of medial meniscus, current injury, left knee, subsequent encounter: Secondary | ICD-10-CM | POA: Diagnosis not present

## 2018-09-27 DIAGNOSIS — I10 Essential (primary) hypertension: Secondary | ICD-10-CM | POA: Diagnosis not present

## 2018-09-27 DIAGNOSIS — F319 Bipolar disorder, unspecified: Secondary | ICD-10-CM | POA: Diagnosis not present

## 2018-09-27 DIAGNOSIS — S83272D Complex tear of lateral meniscus, current injury, left knee, subsequent encounter: Secondary | ICD-10-CM | POA: Diagnosis not present

## 2018-09-27 DIAGNOSIS — M659 Synovitis and tenosynovitis, unspecified: Secondary | ICD-10-CM | POA: Diagnosis not present

## 2018-09-30 DIAGNOSIS — F319 Bipolar disorder, unspecified: Secondary | ICD-10-CM | POA: Diagnosis not present

## 2018-09-30 DIAGNOSIS — M659 Synovitis and tenosynovitis, unspecified: Secondary | ICD-10-CM | POA: Diagnosis not present

## 2018-09-30 DIAGNOSIS — S83242D Other tear of medial meniscus, current injury, left knee, subsequent encounter: Secondary | ICD-10-CM | POA: Diagnosis not present

## 2018-09-30 DIAGNOSIS — S83272D Complex tear of lateral meniscus, current injury, left knee, subsequent encounter: Secondary | ICD-10-CM | POA: Diagnosis not present

## 2018-09-30 DIAGNOSIS — M879 Osteonecrosis, unspecified: Secondary | ICD-10-CM | POA: Diagnosis not present

## 2018-09-30 DIAGNOSIS — I10 Essential (primary) hypertension: Secondary | ICD-10-CM | POA: Diagnosis not present

## 2018-10-03 DIAGNOSIS — F319 Bipolar disorder, unspecified: Secondary | ICD-10-CM | POA: Diagnosis not present

## 2018-10-03 DIAGNOSIS — M659 Synovitis and tenosynovitis, unspecified: Secondary | ICD-10-CM | POA: Diagnosis not present

## 2018-10-03 DIAGNOSIS — M879 Osteonecrosis, unspecified: Secondary | ICD-10-CM | POA: Diagnosis not present

## 2018-10-03 DIAGNOSIS — I10 Essential (primary) hypertension: Secondary | ICD-10-CM | POA: Diagnosis not present

## 2018-10-03 DIAGNOSIS — S83272D Complex tear of lateral meniscus, current injury, left knee, subsequent encounter: Secondary | ICD-10-CM | POA: Diagnosis not present

## 2018-10-03 DIAGNOSIS — S83242D Other tear of medial meniscus, current injury, left knee, subsequent encounter: Secondary | ICD-10-CM | POA: Diagnosis not present

## 2018-10-04 ENCOUNTER — Other Ambulatory Visit: Payer: Self-pay | Admitting: Psychiatry

## 2018-10-04 DIAGNOSIS — F319 Bipolar disorder, unspecified: Secondary | ICD-10-CM

## 2018-10-04 DIAGNOSIS — F5101 Primary insomnia: Secondary | ICD-10-CM

## 2018-10-05 DIAGNOSIS — M659 Synovitis and tenosynovitis, unspecified: Secondary | ICD-10-CM | POA: Diagnosis not present

## 2018-10-05 DIAGNOSIS — S83242D Other tear of medial meniscus, current injury, left knee, subsequent encounter: Secondary | ICD-10-CM | POA: Diagnosis not present

## 2018-10-05 DIAGNOSIS — F319 Bipolar disorder, unspecified: Secondary | ICD-10-CM | POA: Diagnosis not present

## 2018-10-05 DIAGNOSIS — M879 Osteonecrosis, unspecified: Secondary | ICD-10-CM | POA: Diagnosis not present

## 2018-10-05 DIAGNOSIS — S83272D Complex tear of lateral meniscus, current injury, left knee, subsequent encounter: Secondary | ICD-10-CM | POA: Diagnosis not present

## 2018-10-05 DIAGNOSIS — I10 Essential (primary) hypertension: Secondary | ICD-10-CM | POA: Diagnosis not present

## 2018-10-06 DIAGNOSIS — M25562 Pain in left knee: Secondary | ICD-10-CM | POA: Diagnosis not present

## 2018-10-10 DIAGNOSIS — I10 Essential (primary) hypertension: Secondary | ICD-10-CM | POA: Diagnosis not present

## 2018-10-10 DIAGNOSIS — M879 Osteonecrosis, unspecified: Secondary | ICD-10-CM | POA: Diagnosis not present

## 2018-10-10 DIAGNOSIS — S83242D Other tear of medial meniscus, current injury, left knee, subsequent encounter: Secondary | ICD-10-CM | POA: Diagnosis not present

## 2018-10-10 DIAGNOSIS — M659 Synovitis and tenosynovitis, unspecified: Secondary | ICD-10-CM | POA: Diagnosis not present

## 2018-10-10 DIAGNOSIS — F319 Bipolar disorder, unspecified: Secondary | ICD-10-CM | POA: Diagnosis not present

## 2018-10-10 DIAGNOSIS — S83272D Complex tear of lateral meniscus, current injury, left knee, subsequent encounter: Secondary | ICD-10-CM | POA: Diagnosis not present

## 2018-10-11 DIAGNOSIS — M25662 Stiffness of left knee, not elsewhere classified: Secondary | ICD-10-CM | POA: Diagnosis not present

## 2018-10-11 DIAGNOSIS — M25562 Pain in left knee: Secondary | ICD-10-CM | POA: Diagnosis not present

## 2018-10-14 DIAGNOSIS — M25562 Pain in left knee: Secondary | ICD-10-CM | POA: Diagnosis not present

## 2018-10-14 DIAGNOSIS — M25662 Stiffness of left knee, not elsewhere classified: Secondary | ICD-10-CM | POA: Diagnosis not present

## 2018-10-17 DIAGNOSIS — M7989 Other specified soft tissue disorders: Secondary | ICD-10-CM | POA: Diagnosis not present

## 2018-10-17 DIAGNOSIS — R1310 Dysphagia, unspecified: Secondary | ICD-10-CM | POA: Diagnosis not present

## 2018-10-18 DIAGNOSIS — M25562 Pain in left knee: Secondary | ICD-10-CM | POA: Diagnosis not present

## 2018-10-18 DIAGNOSIS — M25662 Stiffness of left knee, not elsewhere classified: Secondary | ICD-10-CM | POA: Diagnosis not present

## 2018-10-21 ENCOUNTER — Encounter (HOSPITAL_COMMUNITY): Payer: Self-pay | Admitting: Emergency Medicine

## 2018-10-21 ENCOUNTER — Emergency Department (HOSPITAL_COMMUNITY): Payer: Medicare Other

## 2018-10-21 ENCOUNTER — Other Ambulatory Visit: Payer: Self-pay

## 2018-10-21 ENCOUNTER — Emergency Department (HOSPITAL_COMMUNITY)
Admission: EM | Admit: 2018-10-21 | Discharge: 2018-10-21 | Disposition: A | Discharge status: Home / Self Care | Payer: Medicare Other | Attending: Emergency Medicine | Admitting: Emergency Medicine

## 2018-10-21 DIAGNOSIS — N2889 Other specified disorders of kidney and ureter: Secondary | ICD-10-CM | POA: Diagnosis not present

## 2018-10-21 DIAGNOSIS — R1084 Generalized abdominal pain: Secondary | ICD-10-CM

## 2018-10-21 DIAGNOSIS — Z96641 Presence of right artificial hip joint: Secondary | ICD-10-CM | POA: Insufficient documentation

## 2018-10-21 DIAGNOSIS — I10 Essential (primary) hypertension: Secondary | ICD-10-CM | POA: Insufficient documentation

## 2018-10-21 DIAGNOSIS — E1142 Type 2 diabetes mellitus with diabetic polyneuropathy: Secondary | ICD-10-CM | POA: Insufficient documentation

## 2018-10-21 DIAGNOSIS — E041 Nontoxic single thyroid nodule: Secondary | ICD-10-CM

## 2018-10-21 DIAGNOSIS — Z794 Long term (current) use of insulin: Secondary | ICD-10-CM | POA: Insufficient documentation

## 2018-10-21 DIAGNOSIS — R109 Unspecified abdominal pain: Secondary | ICD-10-CM | POA: Diagnosis present

## 2018-10-21 DIAGNOSIS — Z79899 Other long term (current) drug therapy: Secondary | ICD-10-CM | POA: Insufficient documentation

## 2018-10-21 DIAGNOSIS — K5730 Diverticulosis of large intestine without perforation or abscess without bleeding: Secondary | ICD-10-CM | POA: Diagnosis not present

## 2018-10-21 LAB — COMPREHENSIVE METABOLIC PANEL
ALT: 11 U/L (ref 0–44)
AST: 13 U/L — ABNORMAL LOW (ref 15–41)
Albumin: 3.5 g/dL (ref 3.5–5.0)
Alkaline Phosphatase: 88 U/L (ref 38–126)
Anion gap: 7 (ref 5–15)
BUN: 13 mg/dL (ref 8–23)
CO2: 27 mmol/L (ref 22–32)
Calcium: 9.7 mg/dL (ref 8.9–10.3)
Chloride: 105 mmol/L (ref 98–111)
Creatinine, Ser: 1.08 mg/dL — ABNORMAL HIGH (ref 0.44–1.00)
GFR calc Af Amer: >60 mL/min (ref >60)
GFR calc non Af Amer: 52 mL/min — ABNORMAL LOW (ref >60)
Glucose, Bld: 157 mg/dL — ABNORMAL HIGH (ref 70–99)
Potassium: 4.1 mmol/L (ref 3.5–5.1)
Sodium: 139 mmol/L (ref 135–145)
Total Bilirubin: 0.3 mg/dL (ref 0.3–1.2)
Total Protein: 6.5 g/dL (ref 6.5–8.1)

## 2018-10-21 LAB — URINALYSIS, ROUTINE W REFLEX MICROSCOPIC
Bilirubin Urine: NEGATIVE
Glucose, UA: NEGATIVE mg/dL
Hgb urine dipstick: NEGATIVE
Ketones, ur: NEGATIVE mg/dL
Nitrite: NEGATIVE
Protein, ur: NEGATIVE mg/dL
Specific Gravity, Urine: 1.011 (ref 1.005–1.030)
WBC, UA: >50 WBC/hpf — ABNORMAL HIGH (ref 0–5)
pH: 6 (ref 5.0–8.0)

## 2018-10-21 LAB — LIPASE, BLOOD: Lipase: 28 U/L (ref 11–51)

## 2018-10-21 LAB — CBC
HCT: 43.7 % (ref 36.0–46.0)
Hemoglobin: 12.8 g/dL (ref 12.0–15.0)
MCH: 28.1 pg (ref 26.0–34.0)
MCHC: 29.3 g/dL — ABNORMAL LOW (ref 30.0–36.0)
MCV: 96 fL (ref 80.0–100.0)
Platelets: 217 10*3/uL (ref 150–400)
RBC: 4.55 MIL/uL (ref 3.87–5.11)
RDW: 13.2 % (ref 11.5–15.5)
WBC: 9.7 10*3/uL (ref 4.0–10.5)
nRBC: 0 % (ref 0.0–0.2)

## 2018-10-21 LAB — LITHIUM LEVEL: Lithium Lvl: 0.72 mmol/L (ref 0.60–1.20)

## 2018-10-21 MED ORDER — ONDANSETRON HCL 4 MG/2ML IJ SOLN
4.0000 mg | Freq: Once | INTRAMUSCULAR | Status: AC
  Administered 2018-10-21: 11:00:00 4 mg via INTRAVENOUS
  Filled 2018-10-21: qty 2

## 2018-10-21 MED ORDER — MORPHINE SULFATE (PF) 2 MG/ML IV SOLN
2.0000 mg | Freq: Once | INTRAVENOUS | Status: AC
  Administered 2018-10-21: 11:00:00 2 mg via INTRAVENOUS
  Filled 2018-10-21: qty 1

## 2018-10-21 MED ORDER — IOHEXOL 300 MG/ML  SOLN
100.0000 mL | Freq: Once | INTRAMUSCULAR | Status: AC | PRN
  Administered 2018-10-21: 13:00:00 100 mL via INTRAVENOUS

## 2018-10-21 MED ORDER — IOHEXOL 300 MG/ML  SOLN
30.0000 mL | Freq: Once | INTRAMUSCULAR | Status: AC | PRN
  Administered 2018-10-21: 30 mL via ORAL

## 2018-10-21 MED ORDER — SODIUM CHLORIDE 0.9 % IV BOLUS
500.0000 mL | Freq: Once | INTRAVENOUS | Status: AC
  Administered 2018-10-21: 13:00:00 500 mL via INTRAVENOUS

## 2018-10-21 NOTE — Discharge Instructions (Addendum)
Continue treating your symptoms with over-the-counter medications.  You can use the Zofran as needed for nausea though be sure to stay hydrated as this also can contribute to mild constipation. Follow-up with your primary care provider regarding the nodule on your throat, this seems to be coming from your thyroid gland.  Continue taking your Synthroid as prescribed. Return to the emergency department if you develop fever, severely worsening abdominal pain, or new or concerning symptoms.

## 2018-10-21 NOTE — ED Provider Notes (Signed)
[facility] DEPT Provider Note   CSN: [id number] Arrival date & time: 10/21/18  0749    History   Chief Complaint Chief Complaint  Patient presents with   Abdominal Pain   knot in throat    HPI [name] [name] is a 73 y.o. female with past medical history of diverticulitis, colitis fibromyalgia, bipolar 1 disorder on lithium, hypothyroidism, type 2 diabetes, presenting to the emergency department with complaint of lower abdominal pains that have been coming and going over the last week.  She has associated nausea without vomiting.  She states about 4 weeks ago she had repair of her left meniscus.  She states about 2 weeks after this she began feeling very constipated due to the Percocet she was prescribed.  She states she did over-the-counter and at home remedies which helped and she has been having softer stools.  Her last bowel movement was about 2 days ago.  Her symptoms feel consistent with colitis versus diverticulitis.  No fevers or chills, no diarrhea or urinary symptoms.  She does note additional complaint of a nodule she feels in her throat.  She states she is unsure of how long it is been there though she noticed it a few days ago.  She does admit to testing more frequently and is becoming sore.  Sometimes it hurts to swallow, however is having no difficulty swallowing.     The history is provided by the patient.    Past Medical History:  Diagnosis Date   Alcohol abuse    Anxiety    takes Valium daily as needed and Ativan daily   Bilateral hearing loss    Bipolar 1 disorder ([place])    takes Lithium nightly and Synthroid daily   Chronic back pain    DDD; "all over" (09/14/2017)   Colitis, ischemic ([place]) 2012   Confusion    r/t meds   Depression    takes Prozac daily and Bupspirone    Diverticulosis    Dyslipidemia    takes Crestor daily   Fibromyalgia    Headache    "weekly" (09/14/2017)   Hepatic steatosis 06/18/12   severe   History of kidney stones    Hyperlipidemia    Hypertension    Hypothyroidism    IBS (irritable bowel syndrome)    Ischemic colitis ([place])    Joint pain    Joint swelling    Lupus erythematosus tumidus    tumid-skin   Migraine    "1-2/yr; maybe" (09/14/2017)   Numbness    in right foot   Osteoarthritis    "all over" (09/14/2017)   Osteoarthritis cervical spine    Osteoarthritis of hand    bilateral   Pneumonia    "walking pneumonia several times; long time since the last time" (09/14/2017)   Restless leg syndrome    takes Requip nightly   Sciatica    Type II diabetes mellitus ([name])    Urinary frequency    Urinary leakage    Urinary urgency    Urinary, incontinence, stress female    Walking pneumonia    last time more than 35yrs ago    Patient Active Problem List   Diagnosis Date Noted   Chest pain    Chest pain with normal coronary angiography    Diverticulitis 11/19/2016   Hypothyroidism 07/14/2016   Diarrhea    Generalized abdominal pain    Major depressive disorder, recurrent severe without psychotic features ([place]) 12/31/2015   Nausea without vomiting  Colitis 10/28/2015   Unintentional poisoning by psychotropic drug 07/05/2015   Restless leg syndrome 07/05/2015   Diabetic polyneuropathy associated with type 2 diabetes mellitus ([place]) 09/21/2014   Arthritis of left hip 06/09/2013   Arthritis of right hip 06/08/2013   Hip pain, right 05/02/2013   IBS (irritable bowel syndrome) 01/02/2013   Unspecified vitamin D deficiency 07/05/2012   Pure hyperglyceridemia 07/04/2012   Diabetes mellitus with diabetic neuropathy, without long-term current use of insulin ([place]) 06/18/2012   Hypertension 06/18/2012   Bipolar 1 disorder ([place]) 06/18/2012   Low back pain 02/25/2011   Acute ischemic colitis ([place]) 01/06/2011   Sciatica 12/19/2010   Fibromyalgia 10/28/2010   Primary osteoarthritis of right hip 10/24/2010    Hyperlipidemia with target low density lipoprotein (LDL) cholesterol less than 100 mg/dL 05/05/2010   DEPRESSION/ANXIETY 05/05/2010   ABUSE, ALCOHOL, IN REMISSION 05/05/2010   OTITIS MEDIA, CHRONIC 05/05/2010   Hearing loss 05/05/2010   ALLERGIC RHINITIS, SEASONAL, MILD 05/05/2010   URINARY INCONTINENCE, STRESS, FEMALE 05/05/2010   OSTEOARTHRITIS, HANDS, BILATERAL 05/05/2010   OSTEOARTHRITIS, CERVICAL SPINE 05/05/2010    Past Surgical History:  Procedure Laterality Date   ABDOMINAL HYSTERECTOMY     "they left my ovaries"   APPENDECTOMY     CARDIAC CATHETERIZATION  09/14/2017   COLONOSCOPY     DENTAL SURGERY Left 10/2016   dental implant   DILATION AND CURETTAGE OF UTERUS  X 4   ESOPHAGOGASTRODUODENOSCOPY     FLEXIBLE SIGMOIDOSCOPY N/A 06/21/2012   Procedure: FLEXIBLE SIGMOIDOSCOPY;  Surgeon: Jerene Bears, MD;  Location: [facility];  Service: Gastroenterology;  Laterality: N/A;   JOINT REPLACEMENT     LEFT HEART CATH AND CORONARY ANGIOGRAPHY N/A 09/14/2017   Procedure: LEFT HEART CATH AND CORONARY ANGIOGRAPHY;  Surgeon: Belva Crome, MD;  Location: [facility];  Service: Cardiovascular;  Laterality: N/A;   SHOULDER ARTHROSCOPY Right    "shaved spurs off rotator cuff"   TONSILLECTOMY     TOTAL HIP ARTHROPLASTY Right 06/09/2013   Procedure: TOTAL HIP ARTHROPLASTY;  Surgeon: Kerin Salen, MD;  Location: [facility];  Service: Orthopedics;  Laterality: Right;   TUBAL LIGATION     TUMOR EXCISION Right 1968   angle of jaw; benign     OB History   No obstetric history on file.      Home Medications    Prior to Admission medications   Medication Sig Start Date End Date Taking? Authorizing Provider  ALPRAZolam Duanne Moron) 0.5 MG tablet Take 1 tablet (0.5 mg total) by mouth 4 (four) times daily as needed for anxiety. 05/26/18   [name], PMHNP  atorvastatin (LIPITOR) 40 MG tablet Take 40 mg by mouth at bedtime.     [provider]  baclofen  (LIORESAL) 10 MG tablet Take 20 mg by mouth 3 (three) times daily as needed for muscle spasms.    [provider]  BD PEN NEEDLE NANO U/F 32G X 4 MM MISC USE TO INJECT 5 TIMES DAILY AS DIRECTED 02/07/18   Elayne Snare, MD  cholecalciferol (VITAMIN D) 1000 units tablet Take 2,000 Units by mouth daily.     [provider]  cyclobenzaprine (FLEXERIL) 5 MG tablet Take 5 mg by mouth 3 (three) times daily as needed for muscle spasms.    [provider]  dicyclomine (BENTYL) 10 MG capsule Take 1 capsule (10 mg total) by mouth every 8 (eight) hours as needed for spasms. 06/30/18   Doran Stabler, MD  diphenoxylate-atropine (  LOMOTIL) 2.5-0.025 MG tablet Take 1 tablet by mouth 2 (two) times daily as needed for diarrhea or loose stools. 05/31/18   Doran Stabler, MD  divalproex (DEPAKOTE ER) 250 MG 24 hr tablet TAKE 2 TABLETS(500 MG) BY MOUTH AT BEDTIME 09/10/18   [name], PMHNP  donepezil (ARICEPT) 10 MG tablet Take 1 tablet at Bedtime 01/11/18   [name], PMHNP  DUREZOL 0.05 % EMUL Place 1 drop into the right eye 3 (three) times daily. 05/04/18   [provider]  gabapentin (NEURONTIN) 400 MG capsule Take 400 mg by mouth 3 (three) times daily.     [provider]  glucose blood (CONTOUR NEXT TEST) test strip Use to test blood sugar 2 times daily 04/28/18   Elayne Snare, MD  IBU 800 MG tablet Take 800 mg by mouth 3 (three) times daily as needed for pain. 02/10/17   [provider]  insulin lispro (HUMALOG KWIKPEN) 100 UNIT/ML KwikPen Inject 10 Units into the skin 3 (three) times daily. INJECT 10 UNITS UNDER THE SKIN THREE TIMES DAILY BEFORE MEALS.    [provider]  Lancets 30G MISC 1 each by Does not apply route daily. 09/13/17   Elayne Snare, MD  LANTUS SOLOSTAR 100 UNIT/ML Solostar Pen ADMINISTER 34 UNITS UNDER THE SKIN DAILY AT BEDTIME 09/19/18   Elayne Snare, MD  levothyroxine (SYNTHROID, LEVOTHROID) 75 MCG tablet Take 1 tablet (75 mcg  total) by mouth daily before breakfast. I tablet daily before breakfast 08/18/17   Elayne Snare, MD  lithium carbonate (ESKALITH) 450 MG CR tablet TAKE 1 TABLET(450 MG) BY MOUTH AT BEDTIME 09/05/18   [name], PMHNP  losartan (COZAAR) 50 MG tablet Take 25 mg by mouth daily.     [provider]  Melatonin 3 MG TABS Take 9 mg by mouth at bedtime.    [provider]  memantine (NAMENDA) 10 MG tablet Take 1 tablet (10 mg total) by mouth every evening. 01/11/18 04/11/18  [name], PMHNP  Mesalamine 800 MG TBEC Take 2 tablets (1,600 mg total) by mouth 3 (three) times daily for 3 days. 06/30/18 07/03/18  Doran Stabler, MD  nitroGLYCERIN (NITROSTAT) 0.4 MG SL tablet Place 1 tablet (0.4 mg total) under the tongue every 5 (five) minutes as needed for chest pain. 09/14/17   Lyda Jester M, PA-C  omega-3 acid ethyl esters (LOVAZA) 1 g capsule Take 1 g by mouth 2 (two) times daily.    [provider]  ondansetron (ZOFRAN ODT) 4 MG disintegrating tablet 4mg  ODT q4 hours prn nausea/vomit 05/19/18   Milton Ferguson, MD  ondansetron (ZOFRAN) 8 MG tablet Take 1 tablet (8 mg total) by mouth every 8 (eight) hours as needed for nausea. 01/01/18   Orlie Dakin, MD  polyethylene glycol [facility]) packet Take 17 g by mouth daily as needed. 11/20/16   Lavina Hamman, MD  QUEtiapine (SEROQUEL) 100 MG tablet TAKE 1& 1/2 TABLETS BY MOUTH EVERY MORNING AND 2 TABLETS BY MOUTH EVERY NIGHT AT BEDTIME 10/04/18   [name], PMHNP  rOPINIRole (REQUIP) 0.5 MG tablet Take 3 tablets (1.5 mg total) by mouth 2 (two) times daily. 06/03/18   [name], PMHNP  Semaglutide, 1 MG/DOSE, (OZEMPIC, 1 MG/DOSE,) 2 MG/1.5ML SOPN Inject 1 mg into the skin once a week. 08/12/18   Elayne Snare, MD  traMADol (ULTRAM) 50 MG tablet Take 1 tablet (50 mg total) by mouth every 6 (six) hours as needed for up to 20 doses. 06/06/18  Doran Stabler, MD  zaleplon (SONATA) 5 MG capsule Take 1 capsule (5 mg total)  by mouth at bedtime. 04/14/18   [name], PMHNP    Family History Family History  Problem Relation Age of Onset   Drug abuse Mother    Alcohol abuse Mother    Alcohol abuse Father    Hypertension Father    CAD Brother    Hypertension Brother    Alcohol abuse Brother    Hypertension Brother    Hypertension Brother    Psoriasis Daughter    Arthritis Daughter        psoriatic arthritis    Alcohol abuse Grandchild     Social History Social History   Tobacco Use   Smoking status: Never Smoker   Smokeless tobacco: Never Used  Substance Use Topics   Alcohol use: Not Currently    Comment: 09/14/2017 "nothing since 04/15/2008"   Drug use: Yes    Frequency: 7.0 times per week    Types: Benzodiazepines    Comment: Pt prescribed Xanax .5 mg, 4x a day     Allergies   Codeine, Macrolides and ketolides, Procaine hcl, Aspirin, Benadryl [diphenhydramine], Diflucan [fluconazole], Dilaudid [hydromorphone hcl], Other, and Sulfa antibiotics   Review of Systems Review of Systems  All other systems reviewed and are negative.    Physical Exam Updated Vital Signs BP (!) 120/56    Pulse 79    Temp 98.2 F (36.8 C) (Oral)    Resp 18    SpO2 96%   Physical Exam Vitals signs and nursing note reviewed.  Constitutional:      General: She is not in acute distress.    Appearance: She is well-developed. She is obese. She is not ill-appearing.  HENT:     Head: Normocephalic and atraumatic.  Eyes:     Conjunctiva/sclera: Conjunctivae normal.  Neck:     Thyroid: Thyroid mass present.     Comments: There is a 2 cm diameter firm mass to the midline neck over the thyroid gland.  This moves with the thyroid gland when patient swallows.  No erythema or warmth of the skin. Cardiovascular:     Rate and Rhythm: Normal rate and regular rhythm.  Pulmonary:     Effort: Pulmonary effort is normal. No respiratory distress.     Breath sounds: Normal breath sounds.  Abdominal:      General: Bowel sounds are normal.     Palpations: Abdomen is soft.     Tenderness: There is abdominal tenderness in the left lower quadrant. There is no guarding or rebound.  Skin:    General: Skin is warm.  Neurological:     Mental Status: She is alert.  Psychiatric:        Behavior: Behavior normal.      ED Treatments / Results  Labs (all labs ordered are listed, but only abnormal results are displayed) Labs Reviewed  COMPREHENSIVE METABOLIC PANEL - Abnormal; Notable for the following components:      Result Value   Glucose, Bld 157 (*)    Creatinine, Ser 1.08 (*)    AST 13 (*)    GFR calc non Af Amer 52 (*)    All other components within normal limits  CBC - Abnormal; Notable for the following components:   MCHC 29.3 (*)    All other components within normal limits  URINALYSIS, ROUTINE W REFLEX MICROSCOPIC - Abnormal; Notable for the following components:   Leukocytes,Ua LARGE (*)  WBC, UA >50 (*)    Bacteria, UA RARE (*)    All other components within normal limits  URINE CULTURE  LIPASE, BLOOD  LITHIUM LEVEL    EKG None  Radiology Ct Abdomen Pelvis W Contrast  Result Date: 10/21/2018 CLINICAL DATA:  Abdominal pain and nausea for the past week. EXAM: CT ABDOMEN AND PELVIS WITH CONTRAST TECHNIQUE: Multidetector CT imaging of the abdomen and pelvis was performed using the standard protocol following bolus administration of intravenous contrast. CONTRAST:  12mL OMNIPAQUE IOHEXOL 300 MG/ML SOLN, 31mL OMNIPAQUE IOHEXOL 300 MG/ML SOLN COMPARISON:  CT abdomen pelvis dated June 30, 2018. FINDINGS: Lower chest: No acute abnormality. Unchanged 5 mm pulmonary nodule in the left lower lobe, benign. Unchanged slightly patulous esophagus. Hepatobiliary: No focal liver abnormality is seen. No gallstones, gallbladder wall thickening, or biliary dilatation. Unchanged mildly prominent common bile duct. Pancreas: Unchanged mildly prominent main pancreatic duct. No surrounding  inflammatory changes. Spleen: Normal in size without focal abnormality. Adrenals/Urinary Tract: Adrenal glands are unremarkable. New mild right renal pelviectasis without frank hydronephrosis. No renal calculi or focal lesion. The bladder is under distended. Stomach/Bowel: Stomach is within normal limits. Appendix is surgically absent. No evidence of bowel wall thickening, distention, or inflammatory changes. Mild sigmoid diverticulosis. Vascular/Lymphatic: No significant vascular findings are present. No enlarged abdominal or pelvic lymph nodes. Reproductive: Status post hysterectomy. No adnexal masses. Other: No free fluid or pneumoperitoneum. Unchanged small fat containing umbilical hernia. Musculoskeletal: No acute or significant osseous findings. Prior right total hip arthroplasty. IMPRESSION: 1.  No acute intra-abdominal process. Electronically Signed   By: Titus Dubin M.D.   On: 10/21/2018 13:23    Procedures Procedures (including critical care time)  Medications Ordered in ED Medications  ondansetron (ZOFRAN) injection 4 mg (4 mg Intravenous Given 10/21/18 1117)  morphine 2 MG/ML injection 2 mg (2 mg Intravenous Given 10/21/18 1117)  iohexol (OMNIPAQUE) 300 MG/ML solution 30 mL (30 mLs Oral Contrast Given 10/21/18 1053)  sodium chloride 0.9 % bolus 500 mL (0 mLs Intravenous Stopped 10/21/18 1419)  iohexol (OMNIPAQUE) 300 MG/ML solution 100 mL (100 mLs Intravenous Contrast Given 10/21/18 1302)     Initial Impression / Assessment and Plan / ED Course  I have reviewed the triage vital signs and the nursing notes.  Pertinent labs & imaging results that were available during my care of the patient were reviewed by me and considered in my medical decision making (see chart for details).        Patient presenting with 1 week of intermittent abdominal pain. Associated symptoms include nausea. Patient is nontoxic, nonseptic appearing, in no apparent distress. Abdomen is soft with generalized  tenderness, no guarding or rebound.   Labs, imaging and vitals reviewed. Ct is negative. Patient does not meet the SIRS or Sepsis criteria.  On repeat exam patient does not have a surgical abdomen and there are no peritoneal signs.  No indication of appendicitis, bowel obstruction, bowel perforation, cholecystitis, diverticulitis. U/a with large leuks and wbs, however rare bacteria. Pt without urinary sx, discussed with DR. Plunkett, will send for culture. Patient's pain and other symptoms adequately managed in emergency department.  Patient also had exam consistent with thyroid nodule, is currently taking Synthroid.  Recommend she follow-up closely with her PCP for further evaluation of this.  Patient discharged home with symptomatic treatment and given strict instructions for follow-up with their primary care physician.  Pt safe for discharge.  Discussed results, findings, treatment and follow up. Patient advised of return  precautions. Patient verbalized understanding and agreed with plan.   Final Clinical Impressions(s) / ED Diagnoses   Final diagnoses:  Generalized abdominal pain  Thyroid nodule    ED Discharge Orders    None       Lotus Gover, [place] N, PA-C 10/21/18 1552    Blanchie Dessert, MD 10/25/18 [phone]

## 2018-10-21 NOTE — ED Notes (Signed)
Blue and gold save tube down in main lab

## 2018-10-21 NOTE — ED Triage Notes (Signed)
Pt c/o abd pains for over week with nausea. Reports hx colitis.  Pt adds that noticed a knot in her throat a week ago and makes painful to swallow.

## 2018-10-23 LAB — URINE CULTURE: Culture: >=100000 — AB

## 2018-10-24 ENCOUNTER — Telehealth: Payer: Self-pay | Admitting: Emergency Medicine

## 2018-10-24 ENCOUNTER — Other Ambulatory Visit: Payer: Self-pay | Admitting: Endocrinology

## 2018-10-24 DIAGNOSIS — M25562 Pain in left knee: Secondary | ICD-10-CM | POA: Diagnosis not present

## 2018-10-24 DIAGNOSIS — M25662 Stiffness of left knee, not elsewhere classified: Secondary | ICD-10-CM | POA: Diagnosis not present

## 2018-10-24 NOTE — Telephone Encounter (Signed)
Post ED Visit - Positive Culture Follow-up: Unsuccessful Patient Follow-up  Culture assessed and recommendations reviewed by:  []  Elenor Quinones, Pharm.D. []  Heide Guile, Pharm.D., BCPS AQ-ID []  Parks Neptune, Pharm.D., BCPS []  Alycia Rossetti, Pharm.D., BCPS []  [place], [place].D., BCPS, AAHIVP []  Legrand Como, Pharm.D., BCPS, AAHIVP []  Wynell Balloon, PharmD [x]  Reuel Boom, PharmD, BCPS  Positive urine culture  [x]  Patient discharged without antimicrobial prescription and treatment is now indicated []  Organism is resistant to prescribed ED discharge antimicrobial []  Patient with positive blood cultures   Unable to contact patient at phone number on file, letter will be sent to address on file  Plan: IF symptomatic, Macrobid 100 mg BID x five days - Cori Razor PA  [place] 10/24/2018, 2:17 PM

## 2018-10-24 NOTE — Telephone Encounter (Signed)
Post ED Visit - Positive Culture Follow-up  Culture report reviewed by antimicrobial stewardship pharmacist: [place] Team []  Elenor Quinones, Pharm.D. []  Heide Guile, Pharm.D., BCPS AQ-ID []  Parks Neptune, Pharm.D., BCPS []  Alycia Rossetti, Pharm.D., BCPS []  [place], Pharm.D., BCPS, AAHIVP []  Legrand Como, Pharm.D., BCPS, AAHIVP []  Salome Arnt, PharmD, BCPS []  Johnnette Gourd, PharmD, BCPS []  Hughes Better, PharmD, BCPS []  Leeroy Cha, PharmD []  Laqueta Linden, PharmD, BCPS []  Albertina Parr, PharmD  [place] Team []  Leodis Sias, PharmD []  Lindell Spar, PharmD []  Royetta Asal, PharmD []  Graylin Shiver, Rph []  Rema Fendt) Glennon Mac, PharmD []  Arlyn Dunning, PharmD []  Netta Cedars, PharmD []  Dia Sitter, PharmD []  Leone Haven, PharmD []  Gretta Arab, PharmD []  Theodis Shove, PharmD []  Peggyann Juba, PharmD [x]  Reuel Boom, PharmD   Positive urine culture Contacted patient for symptoms check, patient states she is improved and denies any abd pain or urinary symptoms, no further patient follow-up is required at this time.  [name] [name] 10/24/2018, 4:53 PM

## 2018-10-25 ENCOUNTER — Other Ambulatory Visit (INDEPENDENT_AMBULATORY_CARE_PROVIDER_SITE_OTHER): Payer: Medicare Other

## 2018-10-25 ENCOUNTER — Other Ambulatory Visit: Payer: Self-pay

## 2018-10-25 DIAGNOSIS — Z794 Long term (current) use of insulin: Secondary | ICD-10-CM | POA: Diagnosis not present

## 2018-10-25 DIAGNOSIS — E1165 Type 2 diabetes mellitus with hyperglycemia: Secondary | ICD-10-CM | POA: Diagnosis not present

## 2018-10-25 LAB — BASIC METABOLIC PANEL
BUN: 10 mg/dL (ref 6–23)
CO2: 29 mEq/L (ref 19–32)
Calcium: 10.2 mg/dL (ref 8.4–10.5)
Chloride: 103 mEq/L (ref 96–112)
Creatinine, Ser: 0.92 mg/dL (ref 0.40–1.20)
GFR: 60.22 mL/min (ref >60.00)
Glucose, Bld: 177 mg/dL — ABNORMAL HIGH (ref 70–99)
Potassium: 4.9 mEq/L (ref 3.5–5.1)
Sodium: 137 mEq/L (ref 135–145)

## 2018-10-25 LAB — HEMOGLOBIN A1C: Hgb A1c MFr Bld: 6.3 % (ref 4.6–6.5)

## 2018-10-26 LAB — PARATHYROID HORMONE, INTACT (NO CA): PTH: 35 pg/mL (ref 15–65)

## 2018-10-27 ENCOUNTER — Other Ambulatory Visit: Payer: Self-pay

## 2018-10-27 DIAGNOSIS — M25562 Pain in left knee: Secondary | ICD-10-CM | POA: Diagnosis not present

## 2018-10-27 DIAGNOSIS — M1712 Unilateral primary osteoarthritis, left knee: Secondary | ICD-10-CM | POA: Diagnosis not present

## 2018-10-27 DIAGNOSIS — Z4789 Encounter for other orthopedic aftercare: Secondary | ICD-10-CM | POA: Diagnosis not present

## 2018-10-27 MED ORDER — MEMANTINE HCL 10 MG PO TABS: 10.0000 mg | ORAL_TABLET | Freq: Every evening | ORAL | 0 refills | Status: DC

## 2018-10-28 ENCOUNTER — Ambulatory Visit (INDEPENDENT_AMBULATORY_CARE_PROVIDER_SITE_OTHER): Payer: Medicare Other | Admitting: Endocrinology

## 2018-10-28 ENCOUNTER — Other Ambulatory Visit: Payer: Self-pay

## 2018-10-28 ENCOUNTER — Encounter: Payer: Self-pay | Admitting: Endocrinology

## 2018-10-28 VITALS — BP 118/70 | HR 100 | Ht 62.0 in | Wt 167.6 lb

## 2018-10-28 DIAGNOSIS — Z794 Long term (current) use of insulin: Secondary | ICD-10-CM

## 2018-10-28 DIAGNOSIS — E041 Nontoxic single thyroid nodule: Secondary | ICD-10-CM

## 2018-10-28 DIAGNOSIS — E1165 Type 2 diabetes mellitus with hyperglycemia: Secondary | ICD-10-CM

## 2018-10-28 DIAGNOSIS — E039 Hypothyroidism, unspecified: Secondary | ICD-10-CM

## 2018-10-28 LAB — GLUCOSE, POCT (MANUAL RESULT ENTRY): POC Glucose: 367 mg/dl — AB (ref 70–99)

## 2018-10-28 NOTE — Patient Instructions (Signed)
Humalog 10-14 units before meals for 3 days  Check blood sugars on waking up days a week  Also check blood sugars about 2 hours after meals and do this after different meals by rotation  Recommended blood sugar levels on waking up are 90-130 and about 2 hours after meal is 130-160  Please bring your blood sugar monitor to each visit, thank you  LANTUS 55 UNITS FOR 3 DAYS WHEN sugar is < 150 in am then go to 48 units

## 2018-10-28 NOTE — Progress Notes (Signed)
Patient ID: [name] [name], female   DOB: 01/29/1946, 73 y.o.   MRN: [id number]            Reason for Appointment: Follow-up for Type 2 Diabetes     History of Present Illness:          Date of diagnosis of type 2 diabetes mellitus: ?  2014        Background history:   She thinks her blood sugar was 400 at the time of diagnosis but no detailed records of this are available She did have an A1c of 10.6 done in 2014 and was probably given Lantus for some time initially Apparently she was treated with various medications including metformin, Janumet and Tradgenta. Her blood sugars had improved and A1c in 2015 was down to 6.2 She tends to have diarrhea with metformin and Janumet and also she thinks it causes dry mouth Most likely Janumet was stopped in 06/2015 Glipizide was started in 8/17 when blood sugars were higher and A1c was 9%  Recent history:   INSULIN regimen is:   Lantus 46 units daily at 5 pm  Humalog: Not taking  Non-insulin hypoglycemic drugs the patient is taking are: Ozempic1 mg weekly  Her A1c was last 7.5% and is now 6.3    Current management, blood sugar patterns and problems identified:  Her blood sugars are being monitored only overnight and not clear why she does not check them consistently at different times as directed  She does appear to have lost a little weight  She did have knee surgery but is still having some pain and is not mobile  Overall she thinks she is snacking less and blood sugars appear to be improved with better diet  As before she does not take any Humalog  No readings after meals available and has not done any readings after about 7 AM  Lantus was increased on the last visit by 2 units and she was told to increase further if needed but she has not  She does take her Ozempic regularly  Her blood sugar is over 300 today and she did have a steroid injection in her knee yesterday, she does not appear to know that she needs to watch  the sugars after getting steroids   Side effects from medications have been:?  Diarrhea from metformin and Janumet  Compliance with the medical regimen: Inconsistent  Glucose monitoring:  done 1 time a day or less        Glucometer: Contour        Blood Glucose readings from meter download:   PRE-MEAL  early a.m. Lunch Dinner Bedtime Overall  Glucose range:  93-172    128-157   Mean/median:      141    Self-care: The diet that the patient has been following [id number], tries to limit drinks with sugar .      Typical meal intake: Breakfast at 9,  May be eggs and sausage and lunch is usually a sandwich   Dinner 7 pm               Dietician visit, most recent: 10/18                 Weight history:  Wt Readings from Last 3 Encounters:  10/28/18 167 lb 9.6 oz (76 kg)  06/18/18 171 lb (77.6 kg)  05/31/18 172 lb (78 kg)    Glycemic control:   Lab Results  Component Value Date   HGBA1C 6.3  10/25/2018   HGBA1C 7.5 (H) 05/04/2018   HGBA1C 7.0 (H) 12/21/2017   Lab Results  Component Value Date   MICROALBUR <0.7 05/04/2018   CREATININE 0.92 10/25/2018   Lab Results  Component Value Date   MICRALBCREAT 1.9 05/04/2018       Allergies as of 10/28/2018      Reactions   Codeine Anxiety, Other (See Comments)   Reaction:  Hallucinations   Macrolides And Ketolides Nausea And Vomiting   Procaine Hcl Palpitations   Aspirin Nausea And Vomiting, Other (See Comments)   Reaction:  Burns pts stomach    Benadryl [diphenhydramine] Other (See Comments)   Per MD "inhibits potency of gabapentin, lithium etc"   Diflucan [fluconazole] Other (See Comments)   Reaction:  Unknown    Dilaudid [hydromorphone Hcl] Other (See Comments)   Reaction:  Migraines and nightmares    Other Nausea And Vomiting, Other (See Comments)   Pt states that all -mycins cause N/V.    Sulfa Antibiotics Other (See Comments)   Reaction:  Unknown       Medication List       Accurate as of October 28, 2018   3:45 PM. If you have any questions, ask your nurse or doctor.        STOP taking these medications   HumaLOG KwikPen 100 UNIT/ML KwikPen Generic drug: insulin lispro Stopped by: Elayne Snare, MD   omega-3 acid ethyl esters 1 g capsule Commonly known as: LOVAZA Stopped by: Elayne Snare, MD     TAKE these medications   ALPRAZolam 0.5 MG tablet Commonly known as: XANAX Take 1 tablet (0.5 mg total) by mouth 4 (four) times daily as needed for anxiety.   atorvastatin 40 MG tablet Commonly known as: LIPITOR Take 40 mg by mouth at bedtime.   baclofen 10 MG tablet Commonly known as: LIORESAL Take 20 mg by mouth 3 (three) times daily as needed for muscle spasms.   BD Pen Needle Nano U/F 32G X 4 MM Misc Generic drug: Insulin Pen Needle USE TO INJECT 5 TIMES DAILY AS DIRECTED   cholecalciferol 1000 units tablet Commonly known as: VITAMIN D Take 2,000 Units by mouth daily.   cyclobenzaprine 5 MG tablet Commonly known as: FLEXERIL Take 5 mg by mouth 3 (three) times daily as needed for muscle spasms.   dicyclomine 10 MG capsule Commonly known as: BENTYL Take 1 capsule (10 mg total) by mouth every 8 (eight) hours as needed for spasms.   diphenoxylate-atropine 2.5-0.025 MG tablet Commonly known as: Lomotil Take 1 tablet by mouth 2 (two) times daily as needed for diarrhea or loose stools.   divalproex 250 MG 24 hr tablet Commonly known as: DEPAKOTE ER TAKE 2 TABLETS(500 MG) BY MOUTH AT BEDTIME   donepezil 10 MG tablet Commonly known as: ARICEPT Take 1 tablet at Bedtime   Durezol 0.05 % Emul Generic drug: Difluprednate Place 1 drop into the right eye 3 (three) times daily.   gabapentin 400 MG capsule Commonly known as: NEURONTIN Take 400 mg by mouth 3 (three) times daily.   glucose blood test strip Commonly known as: Contour Next Test Use to test blood sugar 2 times daily   IBU 800 MG tablet Generic drug: ibuprofen Take 800 mg by mouth 3 (three) times daily as needed for  pain.   Lantus SoloStar 100 UNIT/ML Solostar Pen Generic drug: Insulin Glargine ADMINISTER 34 UNITS UNDER THE SKIN DAILY AT BEDTIME   levothyroxine 75 MCG tablet Commonly known as: SYNTHROID Take  1 tablet (75 mcg total) by mouth daily before breakfast. I tablet daily before breakfast   lithium carbonate 450 MG CR tablet Commonly known as: ESKALITH TAKE 1 TABLET(450 MG) BY MOUTH AT BEDTIME   losartan 50 MG tablet Commonly known as: COZAAR Take 25 mg by mouth daily.   Melatonin 3 MG Tabs Take 9 mg by mouth at bedtime.   memantine 10 MG tablet Commonly known as: NAMENDA Take 1 tablet (10 mg total) by mouth every evening.   Mesalamine 800 MG Tbec Take 2 tablets (1,600 mg total) by mouth 3 (three) times daily for 3 days.   Microlet Lancets Misc USE DAILY AS DIRECTED   nitroGLYCERIN 0.4 MG SL tablet Commonly known as: NITROSTAT Place 1 tablet (0.4 mg total) under the tongue every 5 (five) minutes as needed for chest pain.   ondansetron 4 MG disintegrating tablet Commonly known as: Zofran ODT 4mg  ODT q4 hours prn nausea/vomit   ondansetron 8 MG tablet Commonly known as: Zofran Take 1 tablet (8 mg total) by mouth every 8 (eight) hours as needed for nausea.   polyethylene glycol 17 g packet Commonly known as: MiraLax Take 17 g by mouth daily as needed.   QUEtiapine 100 MG tablet Commonly known as: SEROQUEL TAKE 1& 1/2 TABLETS BY MOUTH EVERY MORNING AND 2 TABLETS BY MOUTH EVERY NIGHT AT BEDTIME   rOPINIRole 0.5 MG tablet Commonly known as: REQUIP Take 3 tablets (1.5 mg total) by mouth 2 (two) times daily.   Semaglutide (1 MG/DOSE) 2 MG/1.5ML Sopn Commonly known as: Ozempic (1 MG/DOSE) Inject 1 mg into the skin once a week.   traMADol 50 MG tablet Commonly known as: ULTRAM Take 1 tablet (50 mg total) by mouth every 6 (six) hours as needed for up to 20 doses.   zaleplon 5 MG capsule Commonly known as: SONATA Take 1 capsule (5 mg total) by mouth at bedtime.        Allergies:  Allergies  Allergen Reactions  . Codeine Anxiety and Other (See Comments)    Reaction:  Hallucinations  . Macrolides And Ketolides Nausea And Vomiting  . Procaine Hcl Palpitations  . Aspirin Nausea And Vomiting and Other (See Comments)    Reaction:  Burns pts stomach   . Benadryl [Diphenhydramine] Other (See Comments)    Per MD "inhibits potency of gabapentin, lithium etc"  . Diflucan [Fluconazole] Other (See Comments)    Reaction:  Unknown   . Dilaudid [Hydromorphone Hcl] Other (See Comments)    Reaction:  Migraines and nightmares   . Other Nausea And Vomiting and Other (See Comments)    Pt states that all -mycins cause N/V.   Marland Kitchen Sulfa Antibiotics Other (See Comments)    Reaction:  Unknown     Past Medical History:  Diagnosis Date  . Alcohol abuse   . Anxiety    takes Valium daily as needed and Ativan daily  . Bilateral hearing loss   . Bipolar 1 disorder ([place])    takes Lithium nightly and Synthroid daily  . Chronic back pain    DDD; "all over" (09/14/2017)  . Colitis, ischemic ([place]) 2012  . Confusion    r/t meds  . Depression    takes Prozac daily and Bupspirone   . Diverticulosis   . Dyslipidemia    takes Crestor daily  . Fibromyalgia   . Headache    "weekly" (09/14/2017)  . Hepatic steatosis 06/18/12   severe  . History of kidney stones   . Hyperlipidemia   .  Hypertension   . Hypothyroidism   . IBS (irritable bowel syndrome)   . Ischemic colitis ([place])   . Joint pain   . Joint swelling   . Lupus erythematosus tumidus    tumid-skin  . Migraine    "1-2/yr; maybe" (09/14/2017)  . Numbness    in right foot  . Osteoarthritis    "all over" (09/14/2017)  . Osteoarthritis cervical spine   . Osteoarthritis of hand    bilateral  . Pneumonia    "walking pneumonia several times; long time since the last time" (09/14/2017)  . Restless leg syndrome    takes Requip nightly  . Sciatica   . Type II diabetes mellitus ([place])   . Urinary frequency   . Urinary  leakage   . Urinary urgency   . Urinary, incontinence, stress female   . Walking pneumonia    last time more than 80yrs ago    Past Surgical History:  Procedure Laterality Date  . ABDOMINAL HYSTERECTOMY     "they left my ovaries"  . APPENDECTOMY    . CARDIAC CATHETERIZATION  09/14/2017  . COLONOSCOPY    . DENTAL SURGERY Left 10/2016   dental implant  . DILATION AND CURETTAGE OF UTERUS  X 4  . ESOPHAGOGASTRODUODENOSCOPY    . FLEXIBLE SIGMOIDOSCOPY N/A 06/21/2012   Procedure: FLEXIBLE SIGMOIDOSCOPY;  Surgeon: Jerene Bears, MD;  Location: [facility];  Service: Gastroenterology;  Laterality: N/A;  . JOINT REPLACEMENT    . LEFT HEART CATH AND CORONARY ANGIOGRAPHY N/A 09/14/2017   Procedure: LEFT HEART CATH AND CORONARY ANGIOGRAPHY;  Surgeon: Belva Crome, MD;  Location: [facility];  Service: Cardiovascular;  Laterality: N/A;  . SHOULDER ARTHROSCOPY Right    "shaved spurs off rotator cuff"  . TONSILLECTOMY    . TOTAL HIP ARTHROPLASTY Right 06/09/2013   Procedure: TOTAL HIP ARTHROPLASTY;  Surgeon: Kerin Salen, MD;  Location: [facility];  Service: Orthopedics;  Laterality: Right;  . TUBAL LIGATION    . TUMOR EXCISION Right 1968   angle of jaw; benign    Family History  Problem Relation Age of Onset  . Drug abuse Mother   . Alcohol abuse Mother   . Alcohol abuse Father   . Hypertension Father   . CAD Brother   . Hypertension Brother   . Alcohol abuse Brother   . Hypertension Brother   . Hypertension Brother   . Psoriasis Daughter   . Arthritis Daughter        psoriatic arthritis   . Alcohol abuse Grandchild     Social History:  reports that she has never smoked. She has never used smokeless tobacco. She reports previous alcohol use. She reports current drug use. Frequency: 7.00 times per week. Drug: Benzodiazepines.   Review of Systems    Lipid history: On Lipitor 40 mg prescribed by her PCP Last LDL was high and just above 100  Triglycerides last over 200  On  fish oil ? 2 daily   Lab Results  Component Value Date   CHOL 178 05/04/2018   HDL 64.40 05/04/2018   LDLDIRECT 103.0 05/04/2018   TRIG 240.0 (H) 05/04/2018   CHOLHDL 3 05/04/2018            Most recent eye exam was In 03/2018, negative  Most recent foot exam:03/2016  THYROID: She has been on levothyroxine and Cytomel from her psychiatrist for several years with uncertain diagnosis Currently taking levothyroxine 75 g, also on Cytomel 25 g Overall  feeling fairly good  Also on lithium  Her TSH is fairly consistently near 1.0 Last Free T3 and free T4 are normal  Labs as follows:  Lab Results  Component Value Date   TSH 1.35 07/04/2018   TSH 1.09 12/21/2017   TSH 3.86 03/22/2017   FREET4 0.77 07/04/2018   FREET4 0.65 12/21/2017   FREET4 1.03 03/22/2017   Lab Results  Component Value Date   T3FREE 2.5 07/04/2018   T3FREE 2.3 12/21/2017   T3FREE 3.0 03/22/2017   T3FREE 3.2 07/10/2016   T3FREE 2.7 04/30/2016    She has atypical depression on multiple drugs, has been on Seroquel for quite some time  THYROID nodule:  About 2 weeks ago she felt a knot in her neck which was painless She thinks the swelling has gone down since she first found this  Previously had mild hypercalcemia, now this is upper normal Not on HCTZ  Lab Results  Component Value Date   PTH 35 10/25/2018   CALCIUM 10.2 10/25/2018   CAION 1.30 07/14/2016   PHOS 2.9 12/26/2010     Physical Examination:  BP 118/70 (BP Location: Left Arm, Patient Position: Sitting, Cuff Size: Normal)   Pulse 100   Ht 5\' 2"  (1.575 m)   Wt 167 lb 9.6 oz (76 kg)   SpO2 97%   BMI 30.65 kg/m      She has a 2-2.5 cm midline firm smooth nodule in the isthmus Lateral lobes of the thyroid not palpable No lymphadenopathy in the neck    ASSESSMENT:  Diabetes type 2, on insulin  See history of present illness for detailed discussion of current diabetes management, blood sugar patterns and problems identified   She is on Ozempic, basal only for insulin. Blood sugars are improved with A1c 6.3  Although she is having some variability in her blood sugars these are mostly checked early morning and show no consistent trend Also now her blood sugar is very high after getting a steroid injection in her knee yesterday   Hypothyroidism, likely secondary: Thyroid levels need to be checked on the next visit  She is on a combination of Cytomel and levothyroxine as before and will continue  HYPERCALCEMIA: Calcium is upper normal, may well have mild hyperparathyroidism Need to consider bone density when she is able to do this   medication related and has not been consistently high, previously not evaluated   PLAN:  She needs to check blood sugars consistently before or after meals throughout the day In the next few days she will need to check sugars 4 times a day because of hyperglycemia from steroid She will start with 10 units of Humalog before each meal and may need 14 units or more if blood sugars are not improved with this until her blood sugars are at least below 150 She will temporarily use 55 Lantus for 3 days and subsequently 48 units Consistent diet Stay on Ozempic   Thyroid ultrasound.  Discussed nature of thyroid nodules, possibility of doing biopsy and how this would be done if needed  Continue to follow calcium levels  Follow-up in 3 months  Patient Instructions  Humalog 10-14 units before meals for 3 days  Check blood sugars on waking up days a week  Also check blood sugars about 2 hours after meals and do this after different meals by rotation  Recommended blood sugar levels on waking up are 90-130 and about 2 hours after meal is 130-160  Please bring your blood  sugar monitor to each visit, thank you  LANTUS 55 UNITS FOR 3 DAYS WHEN sugar is < 150 in am then go to 48 units      Counseling time on subjects discussed in assessment and plan sections is over 50% of today's 25  minute visit    Elayne Snare 10/28/2018, 3:45 PM   Note: This office note was prepared with Dragon voice recognition system technology. Any transcriptional errors that result from this process are unintentional.

## 2018-10-29 DIAGNOSIS — R350 Frequency of micturition: Secondary | ICD-10-CM | POA: Diagnosis not present

## 2018-10-29 DIAGNOSIS — N390 Urinary tract infection, site not specified: Secondary | ICD-10-CM | POA: Diagnosis not present

## 2018-10-31 ENCOUNTER — Other Ambulatory Visit: Payer: Self-pay | Admitting: Endocrinology

## 2018-11-01 DIAGNOSIS — M797 Fibromyalgia: Secondary | ICD-10-CM | POA: Diagnosis not present

## 2018-11-01 DIAGNOSIS — I10 Essential (primary) hypertension: Secondary | ICD-10-CM | POA: Diagnosis not present

## 2018-11-01 DIAGNOSIS — M25562 Pain in left knee: Secondary | ICD-10-CM | POA: Diagnosis not present

## 2018-11-01 DIAGNOSIS — M175 Other unilateral secondary osteoarthritis of knee: Secondary | ICD-10-CM | POA: Diagnosis not present

## 2018-11-01 DIAGNOSIS — M23201 Derangement of unspecified lateral meniscus due to old tear or injury, left knee: Secondary | ICD-10-CM | POA: Diagnosis not present

## 2018-11-01 DIAGNOSIS — M23302 Other meniscus derangements, unspecified lateral meniscus, unspecified knee: Secondary | ICD-10-CM | POA: Diagnosis not present

## 2018-11-01 DIAGNOSIS — F319 Bipolar disorder, unspecified: Secondary | ICD-10-CM | POA: Diagnosis not present

## 2018-11-01 DIAGNOSIS — E119 Type 2 diabetes mellitus without complications: Secondary | ICD-10-CM | POA: Diagnosis not present

## 2018-11-01 DIAGNOSIS — G8929 Other chronic pain: Secondary | ICD-10-CM | POA: Diagnosis not present

## 2018-11-01 DIAGNOSIS — M1611 Unilateral primary osteoarthritis, right hip: Secondary | ICD-10-CM | POA: Diagnosis not present

## 2018-11-02 ENCOUNTER — Other Ambulatory Visit: Payer: Self-pay | Admitting: Psychiatry

## 2018-11-02 DIAGNOSIS — F319 Bipolar disorder, unspecified: Secondary | ICD-10-CM

## 2018-11-02 DIAGNOSIS — F5101 Primary insomnia: Secondary | ICD-10-CM

## 2018-11-04 ENCOUNTER — Encounter: Payer: Self-pay | Admitting: Psychiatry

## 2018-11-04 ENCOUNTER — Other Ambulatory Visit: Payer: Self-pay

## 2018-11-04 ENCOUNTER — Ambulatory Visit (INDEPENDENT_AMBULATORY_CARE_PROVIDER_SITE_OTHER): Payer: Medicare Other | Admitting: Psychiatry

## 2018-11-04 DIAGNOSIS — G2581 Restless legs syndrome: Secondary | ICD-10-CM | POA: Diagnosis not present

## 2018-11-04 DIAGNOSIS — F319 Bipolar disorder, unspecified: Secondary | ICD-10-CM

## 2018-11-04 DIAGNOSIS — F411 Generalized anxiety disorder: Secondary | ICD-10-CM

## 2018-11-04 DIAGNOSIS — F5101 Primary insomnia: Secondary | ICD-10-CM

## 2018-11-04 MED ORDER — LITHIUM CARBONATE ER 450 MG PO TBCR: EXTENDED_RELEASE_TABLET | ORAL | 0 refills | Status: DC

## 2018-11-04 MED ORDER — ALPRAZOLAM 0.5 MG PO TABS: 0.5000 mg | ORAL_TABLET | Freq: Four times a day (QID) | ORAL | 2 refills | Status: DC | PRN

## 2018-11-04 MED ORDER — ROPINIROLE HCL 0.5 MG PO TABS: 1.5000 mg | ORAL_TABLET | Freq: Two times a day (BID) | ORAL | 0 refills | Status: DC

## 2018-11-04 MED ORDER — QUETIAPINE FUMARATE 100 MG PO TABS: ORAL_TABLET | ORAL | 0 refills | Status: DC
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
## 2018-11-07 DIAGNOSIS — M25562 Pain in left knee: Secondary | ICD-10-CM | POA: Diagnosis not present

## 2018-11-07 DIAGNOSIS — M25662 Stiffness of left knee, not elsewhere classified: Secondary | ICD-10-CM | POA: Diagnosis not present

## 2018-11-09 DIAGNOSIS — M25562 Pain in left knee: Secondary | ICD-10-CM | POA: Diagnosis not present

## 2018-11-09 DIAGNOSIS — M25662 Stiffness of left knee, not elsewhere classified: Secondary | ICD-10-CM | POA: Diagnosis not present

## 2018-11-17 ENCOUNTER — Other Ambulatory Visit: Payer: Self-pay

## 2018-11-17 ENCOUNTER — Emergency Department (HOSPITAL_COMMUNITY)
Admission: EM | Admit: 2018-11-17 | Discharge: 2018-11-17 | Disposition: A | Discharge status: Home / Self Care | Payer: Medicare Other | Attending: Emergency Medicine | Admitting: Emergency Medicine

## 2018-11-17 ENCOUNTER — Emergency Department (HOSPITAL_COMMUNITY): Payer: Medicare Other

## 2018-11-17 ENCOUNTER — Encounter (HOSPITAL_COMMUNITY): Payer: Self-pay

## 2018-11-17 DIAGNOSIS — E1142 Type 2 diabetes mellitus with diabetic polyneuropathy: Secondary | ICD-10-CM | POA: Diagnosis not present

## 2018-11-17 DIAGNOSIS — N390 Urinary tract infection, site not specified: Secondary | ICD-10-CM | POA: Insufficient documentation

## 2018-11-17 DIAGNOSIS — R55 Syncope and collapse: Secondary | ICD-10-CM | POA: Insufficient documentation

## 2018-11-17 DIAGNOSIS — Z794 Long term (current) use of insulin: Secondary | ICD-10-CM | POA: Insufficient documentation

## 2018-11-17 DIAGNOSIS — I10 Essential (primary) hypertension: Secondary | ICD-10-CM | POA: Insufficient documentation

## 2018-11-17 DIAGNOSIS — Z79899 Other long term (current) drug therapy: Secondary | ICD-10-CM | POA: Insufficient documentation

## 2018-11-17 LAB — CBC
HCT: 44.5 % (ref 36.0–46.0)
Hemoglobin: 13.7 g/dL (ref 12.0–15.0)
MCH: 28.8 pg (ref 26.0–34.0)
MCHC: 30.8 g/dL (ref 30.0–36.0)
MCV: 93.7 fL (ref 80.0–100.0)
Platelets: 209 10*3/uL (ref 150–400)
RBC: 4.75 MIL/uL (ref 3.87–5.11)
RDW: 13.3 % (ref 11.5–15.5)
WBC: 10.1 10*3/uL (ref 4.0–10.5)
nRBC: 0 % (ref 0.0–0.2)

## 2018-11-17 LAB — CBG MONITORING, ED: Glucose-Capillary: 182 mg/dL — ABNORMAL HIGH (ref 70–99)

## 2018-11-17 LAB — URINALYSIS, ROUTINE W REFLEX MICROSCOPIC
Bacteria, UA: NONE SEEN
Bilirubin Urine: NEGATIVE
Glucose, UA: NEGATIVE mg/dL
Hgb urine dipstick: NEGATIVE
Ketones, ur: NEGATIVE mg/dL
Nitrite: NEGATIVE
Protein, ur: NEGATIVE mg/dL
Specific Gravity, Urine: 1.011 (ref 1.005–1.030)
WBC, UA: >50 WBC/hpf — ABNORMAL HIGH (ref 0–5)
pH: 6 (ref 5.0–8.0)

## 2018-11-17 LAB — BASIC METABOLIC PANEL
Anion gap: 10 (ref 5–15)
BUN: 8 mg/dL (ref 8–23)
CO2: 24 mmol/L (ref 22–32)
Calcium: 9.9 mg/dL (ref 8.9–10.3)
Chloride: 106 mmol/L (ref 98–111)
Creatinine, Ser: 0.87 mg/dL (ref 0.44–1.00)
GFR calc Af Amer: >60 mL/min (ref >60)
GFR calc non Af Amer: >60 mL/min (ref >60)
Glucose, Bld: 186 mg/dL — ABNORMAL HIGH (ref 70–99)
Potassium: 4.3 mmol/L (ref 3.5–5.1)
Sodium: 140 mmol/L (ref 135–145)

## 2018-11-17 LAB — LITHIUM LEVEL: Lithium Lvl: 0.46 mmol/L — ABNORMAL LOW (ref 0.60–1.20)

## 2018-11-17 LAB — TROPONIN I (HIGH SENSITIVITY): Troponin I (High Sensitivity): <2 ng/L (ref <18)

## 2018-11-17 LAB — ETHANOL: Alcohol, Ethyl (B): <10 mg/dL (ref <10)

## 2018-11-17 MED ORDER — CEPHALEXIN 500 MG PO CAPS: 500.0000 mg | ORAL_CAPSULE | Freq: Four times a day (QID) | ORAL | 0 refills | Status: DC

## 2018-11-17 MED ORDER — SODIUM CHLORIDE 0.9 % IV BOLUS
500.0000 mL | Freq: Once | INTRAVENOUS | Status: AC
  Administered 2018-11-17: 500 mL via INTRAVENOUS

## 2018-11-17 MED ORDER — SODIUM CHLORIDE 0.9% FLUSH: 3.0000 mL | Freq: Once | INTRAVENOUS | Status: DC

## 2018-11-17 NOTE — Discharge Instructions (Signed)
Please return for any problem.  Follow-up with your regular care provider as instructed. °

## 2018-11-17 NOTE — ED Triage Notes (Signed)
Patient c/o syncopal episode last night and once today.

## 2018-11-17 NOTE — ED Provider Notes (Signed)
[facility] DEPT Provider Note   CSN: [id number] Arrival date & time: 11/17/18  1529     History   Chief Complaint Chief Complaint  Patient presents with  . Loss of Consciousness    HPI [name] [name] is a 73 y.o. female.     73 year old female with prior medical history as detailed below presents for evaluation of multiple complaints.  Patient reports feeling weak and dizzy since yesterday.  She reports 2 very brief episodes of syncope.  Her first episode was last night while she was at home.  Her reported period of syncope at that time was "several seconds."  She reports another episode of syncope this morning while riding in the car with her husband.  This episode of syncope also lasted "several seconds."  She denies associated chest pain, palpitations, shortness of breath, nausea, vomiting, or other acute complaint.  She reports feeling improved upon my evaluation.    The history is provided by the patient and medical records.  Loss of Consciousness Episode history:  Multiple Most recent episode:  Yesterday Duration:  2 seconds Timing:  Intermittent Progression:  Waxing and waning Chronicity:  New Relieved by:  Nothing Worsened by:  Nothing Associated symptoms: no chest pain, no focal weakness, no headaches and no shortness of breath     Past Medical History:  Diagnosis Date  . Alcohol abuse   . Anxiety    takes Valium daily as needed and Ativan daily  . Bilateral hearing loss   . Bipolar 1 disorder ([place])    takes Lithium nightly and Synthroid daily  . Chronic back pain    DDD; "all over" (09/14/2017)  . Colitis, ischemic ([place]) 2012  . Confusion    r/t meds  . Depression    takes Prozac daily and Bupspirone   . Diverticulosis   . Dyslipidemia    takes Crestor daily  . Fibromyalgia   . Headache    "weekly" (09/14/2017)  . Hepatic steatosis 06/18/12   severe  . History of kidney stones   . Hyperlipidemia   . Hypertension   .  Hypothyroidism   . IBS (irritable bowel syndrome)   . Ischemic colitis ([place])   . Joint pain   . Joint swelling   . Lupus erythematosus tumidus    tumid-skin  . Migraine    "1-2/yr; maybe" (09/14/2017)  . Numbness    in right foot  . Osteoarthritis    "all over" (09/14/2017)  . Osteoarthritis cervical spine   . Osteoarthritis of hand    bilateral  . Pneumonia    "walking pneumonia several times; long time since the last time" (09/14/2017)  . Restless leg syndrome    takes Requip nightly  . Sciatica   . Type II diabetes mellitus ([place])   . Urinary frequency   . Urinary leakage   . Urinary urgency   . Urinary, incontinence, stress female   . Walking pneumonia    last time more than 28yrs ago    Patient Active Problem List   Diagnosis Date Noted  . Chest pain   . Chest pain with normal coronary angiography   . Diverticulitis 11/19/2016  . Hypothyroidism 07/14/2016  . Diarrhea   . Generalized abdominal pain   . Major depressive disorder, recurrent severe without psychotic features ([place]) 12/31/2015  . Nausea without vomiting   . Colitis 10/28/2015  . Unintentional poisoning by psychotropic drug 07/05/2015  . Restless leg syndrome 07/05/2015  . Diabetic polyneuropathy associated with  type 2 diabetes mellitus ([place]) 09/21/2014  . Arthritis of left hip 06/09/2013  . Arthritis of right hip 06/08/2013  . Hip pain, right 05/02/2013  . IBS (irritable bowel syndrome) 01/02/2013  . Unspecified vitamin D deficiency 07/05/2012  . Pure hyperglyceridemia 07/04/2012  . Diabetes mellitus with diabetic neuropathy, without long-term current use of insulin ([place]) 06/18/2012  . Hypertension 06/18/2012  . Bipolar 1 disorder ([place]) 06/18/2012  . Low back pain 02/25/2011  . Acute ischemic colitis ([place]) 01/06/2011  . Sciatica 12/19/2010  . Fibromyalgia 10/28/2010  . Primary osteoarthritis of right hip 10/24/2010  . Hyperlipidemia with target low density lipoprotein (LDL) cholesterol less than 100  mg/dL 05/05/2010  . DEPRESSION/ANXIETY 05/05/2010  . ABUSE, ALCOHOL, IN REMISSION 05/05/2010  . OTITIS MEDIA, CHRONIC 05/05/2010  . Hearing loss 05/05/2010  . ALLERGIC RHINITIS, SEASONAL, MILD 05/05/2010  . URINARY INCONTINENCE, STRESS, FEMALE 05/05/2010  . OSTEOARTHRITIS, HANDS, BILATERAL 05/05/2010  . OSTEOARTHRITIS, CERVICAL SPINE 05/05/2010    Past Surgical History:  Procedure Laterality Date  . ABDOMINAL HYSTERECTOMY     "they left my ovaries"  . APPENDECTOMY    . CARDIAC CATHETERIZATION  09/14/2017  . COLONOSCOPY    . DENTAL SURGERY Left 10/2016   dental implant  . DILATION AND CURETTAGE OF UTERUS  X 4  . ESOPHAGOGASTRODUODENOSCOPY    . FLEXIBLE SIGMOIDOSCOPY N/A 06/21/2012   Procedure: FLEXIBLE SIGMOIDOSCOPY;  Surgeon: Jerene Bears, MD;  Location: [facility];  Service: Gastroenterology;  Laterality: N/A;  . JOINT REPLACEMENT    . LEFT HEART CATH AND CORONARY ANGIOGRAPHY N/A 09/14/2017   Procedure: LEFT HEART CATH AND CORONARY ANGIOGRAPHY;  Surgeon: Belva Crome, MD;  Location: [facility];  Service: Cardiovascular;  Laterality: N/A;  . SHOULDER ARTHROSCOPY Right    "shaved spurs off rotator cuff"  . TONSILLECTOMY    . TOTAL HIP ARTHROPLASTY Right 06/09/2013   Procedure: TOTAL HIP ARTHROPLASTY;  Surgeon: Kerin Salen, MD;  Location: [facility];  Service: Orthopedics;  Laterality: Right;  . TUBAL LIGATION    . TUMOR EXCISION Right 1968   angle of jaw; benign     OB History   No obstetric history on file.      Home Medications    Prior to Admission medications   Medication Sig Start Date End Date Taking? Authorizing Provider  ALPRAZolam Duanne Moron) 0.5 MG tablet Take 1 tablet (0.5 mg total) by mouth 4 (four) times daily as needed for anxiety. 11/04/18  Yes [name], PMHNP  atorvastatin (LIPITOR) 40 MG tablet Take 40 mg by mouth at bedtime.    Yes [provider]  cholecalciferol (VITAMIN D) 1000 units tablet Take 2,000 Units by mouth daily.    Yes  [provider]  divalproex (DEPAKOTE ER) 250 MG 24 hr tablet TAKE 2 TABLETS(500 MG) BY MOUTH AT BEDTIME Patient taking differently: Take 500 mg by mouth at bedtime.  09/10/18  Yes [name], PMHNP  donepezil (ARICEPT) 10 MG tablet Take 1 tablet at Bedtime 01/11/18  Yes [name], PMHNP  gabapentin (NEURONTIN) 400 MG capsule Take 400 mg by mouth 3 (three) times daily.    Yes [provider]  insulin lispro (HUMALOG) 100 UNIT/ML injection Inject 6-8 Units into the skin 3 (three) times daily with meals. Take 6 units in the morning, 6 units at noon and Take 8 units at 5 pm.   Yes [provider]  LANTUS SOLOSTAR 100 UNIT/ML Solostar Pen ADMINISTER 34 UNITS UNDER THE SKIN DAILY AT BEDTIME  Patient taking differently: Inject 55 Units into the skin at bedtime.  09/19/18  Yes Elayne Snare, MD  levothyroxine (SYNTHROID) 75 MCG tablet TAKE 1 TABLET BY MOUTH DAILY BEFORE BREAKFAST Patient taking differently: Take 75 mcg by mouth daily before breakfast.  10/31/18  Yes Elayne Snare, MD  lithium carbonate (ESKALITH) 450 MG CR tablet TAKE 1 TABLET(450 MG) BY MOUTH AT BEDTIME 11/04/18  Yes [name], PMHNP  losartan (COZAAR) 50 MG tablet Take 25 mg by mouth daily.    Yes [provider]  Melatonin 3 MG TABS Take 9 mg by mouth at bedtime.   Yes [provider]  memantine (NAMENDA) 10 MG tablet Take 1 tablet (10 mg total) by mouth every evening. 10/27/18 01/25/19 Yes [name], PMHNP  nitroGLYCERIN (NITROSTAT) 0.4 MG SL tablet Place 1 tablet (0.4 mg total) under the tongue every 5 (five) minutes as needed for chest pain. 09/14/17  Yes Lyda Jester M, PA-C  ondansetron (ZOFRAN ODT) 4 MG disintegrating tablet 4mg  ODT q4 hours prn nausea/vomit 05/19/18  Yes Milton Ferguson, MD  oxyCODONE (OXY IR/ROXICODONE) 5 MG immediate release tablet Take 5 mg by mouth every 6 (six) hours as needed for moderate pain or severe pain.    Yes [provider]   Oxycodone HCl 10 MG TABS Take 10 mg by mouth every 6 (six) hours as needed (pain).  11/02/18  Yes [provider]  QUEtiapine (SEROQUEL) 100 MG tablet TAKE 1 AND 1/2 TABLETS BY MOUTH EVERY MORNING AND 2 TABLETS EVERY NIGHT AT BEDTIME Patient taking differently: Take 200 mg by mouth 2 (two) times daily.  11/04/18  Yes [name], PMHNP  rOPINIRole (REQUIP) 0.5 MG tablet Take 3 tablets (1.5 mg total) by mouth 2 (two) times daily. 11/04/18  Yes [name], PMHNP  Semaglutide, 1 MG/DOSE, (OZEMPIC, 1 MG/DOSE,) 2 MG/1.5ML SOPN Inject 1 mg into the skin once a week. 08/12/18  Yes Elayne Snare, MD  BD PEN NEEDLE NANO U/F 32G X 4 MM MISC USE TO INJECT 5 TIMES DAILY AS DIRECTED 02/07/18   Elayne Snare, MD  dicyclomine (BENTYL) 10 MG capsule Take 1 capsule (10 mg total) by mouth every 8 (eight) hours as needed for spasms. Patient not taking: Reported on 11/17/2018 06/30/18   Doran Stabler, MD  diphenoxylate-atropine (LOMOTIL) 2.5-0.025 MG tablet Take 1 tablet by mouth 2 (two) times daily as needed for diarrhea or loose stools. Patient not taking: Reported on 11/17/2018 05/31/18   [name], MD  glucose blood (CONTOUR NEXT TEST) test strip Use to test blood sugar 2 times daily 04/28/18   Elayne Snare, MD  Mesalamine 800 MG TBEC Take 2 tablets (1,600 mg total) by mouth 3 (three) times daily for 3 days. 06/30/18 07/03/18  Doran Stabler, MD  Microlet Lancets MISC USE DAILY AS DIRECTED 10/24/18   Elayne Snare, MD  ondansetron (ZOFRAN) 8 MG tablet Take 1 tablet (8 mg total) by mouth every 8 (eight) hours as needed for nausea. Patient not taking: Reported on 11/17/2018 01/01/18   Orlie Dakin, MD  polyethylene glycol [facility]) packet Take 17 g by mouth daily as needed. Patient not taking: Reported on 11/17/2018 11/20/16   Lavina Hamman, MD  traMADol (ULTRAM) 50 MG tablet Take 1 tablet (50 mg total) by mouth every 6 (six) hours as needed for up to 20 doses. Patient not taking: Reported on  11/17/2018 06/06/18   Doran Stabler, MD  zaleplon (SONATA) 5 MG capsule Take  1 capsule (5 mg total) by mouth at bedtime. Patient not taking: Reported on 11/04/2018 04/14/18   [name], PMHNP    Family History Family History  Problem Relation Age of Onset  . Drug abuse Mother   . Alcohol abuse Mother   . Alcohol abuse Father   . Hypertension Father   . CAD Brother   . Hypertension Brother   . Alcohol abuse Brother   . Hypertension Brother   . Hypertension Brother   . Psoriasis Daughter   . Arthritis Daughter        psoriatic arthritis   . Alcohol abuse Grandchild     Social History Social History   Tobacco Use  . Smoking status: Never Smoker  . Smokeless tobacco: Never Used  Substance Use Topics  . Alcohol use: Not Currently    Comment: 09/14/2017 "nothing since 04/15/2008"  . Drug use: Not on file     Allergies   Codeine, Macrolides and ketolides, Procaine hcl, Aspirin, Benadryl [diphenhydramine], Diflucan [fluconazole], Dilaudid [hydromorphone hcl], Other, and Sulfa antibiotics   Review of Systems Review of Systems  Respiratory: Negative for shortness of breath.   Cardiovascular: Positive for syncope. Negative for chest pain.  Neurological: Negative for focal weakness and headaches.  All other systems reviewed and are negative.    Physical Exam Updated Vital Signs BP 133/71 (BP Location: Right Arm)   Pulse 92   Temp 98.8 F (37.1 C) (Oral)   Resp 16   Ht 5\' 1"  (1.549 m)   Wt 77.1 kg   SpO2 97%   BMI 32.12 kg/m   Physical Exam Vitals signs and nursing note reviewed.  Constitutional:      General: She is not in acute distress.    Appearance: Normal appearance. She is well-developed.  HENT:     Head: Normocephalic and atraumatic.  Eyes:     Conjunctiva/sclera: Conjunctivae normal.     Pupils: Pupils are equal, round, and reactive to light.  Neck:     Musculoskeletal: Normal range of motion and neck supple.  Cardiovascular:     Rate and  Rhythm: Normal rate and regular rhythm.     Heart sounds: Normal heart sounds.  Pulmonary:     Effort: Pulmonary effort is normal. No respiratory distress.     Breath sounds: Normal breath sounds.  Abdominal:     General: There is no distension.     Palpations: Abdomen is soft.     Tenderness: There is no abdominal tenderness.  Musculoskeletal: Normal range of motion.        General: No deformity.  Skin:    General: Skin is warm and dry.  Neurological:     General: No focal deficit present.     Mental Status: She is alert and oriented to person, place, and time. Mental status is at baseline.     Cranial Nerves: No cranial nerve deficit.     Sensory: No sensory deficit.     Motor: No weakness.     Coordination: Coordination normal.     Gait: Gait normal.      ED Treatments / Results  Labs (all labs ordered are listed, but only abnormal results are displayed) Labs Reviewed  BASIC METABOLIC PANEL - Abnormal; Notable for the following components:      Result Value   Glucose, Bld 186 (*)    All other components within normal limits  URINALYSIS, ROUTINE W REFLEX MICROSCOPIC - Abnormal; Notable for the following components:   APPearance HAZY (*)  Leukocytes,Ua LARGE (*)    WBC, UA >50 (*)    All other components within normal limits  LITHIUM LEVEL - Abnormal; Notable for the following components:   Lithium Lvl 0.46 (*)    All other components within normal limits  CBG MONITORING, ED - Abnormal; Notable for the following components:   Glucose-Capillary 182 (*)    All other components within normal limits  URINE CULTURE  CBC  ETHANOL  TROPONIN I (HIGH SENSITIVITY)  TROPONIN I (HIGH SENSITIVITY)    EKG EKG Interpretation  Date/Time:  Thursday November 17 2018 15:37:53 EDT Ventricular Rate:  97 PR Interval:    QRS Duration: 79 QT Interval:  407 QTC Calculation: 517 R Axis:   89 Text Interpretation:  Sinus rhythm Borderline right axis deviation Low voltage,  precordial leads Anteroseptal infarct, age indeterminate Prolonged QT interval Baseline wander in lead(s) III Confirmed by Dene Gentry [phone]) on 11/17/2018 5:32:25 PM   Radiology Dg Chest Port 1 View  Result Date: 11/17/2018 CLINICAL DATA:  Syncope EXAM: PORTABLE CHEST 1 VIEW COMPARISON:  06/18/2018 FINDINGS: The heart size and mediastinal contours are within normal limits. Both lungs are clear. The visualized skeletal structures are unremarkable. IMPRESSION: No acute abnormality of the lungs in AP portable projection. Electronically Signed   By: Eddie Candle M.D.   On: 11/17/2018 18:39    Procedures Procedures (including critical care time)  Medications Ordered in ED Medications  sodium chloride flush (NS) 0.9 % injection 3 mL (0 mLs Intravenous Hold 11/17/18 1928)  sodium chloride 0.9 % bolus 500 mL (0 mLs Intravenous Stopped 11/17/18 1927)     Initial Impression / Assessment and Plan / ED Course  I have reviewed the triage vital signs and the nursing notes.  Pertinent labs & imaging results that were available during my care of the patient were reviewed by me and considered in my medical decision making (see chart for details).        MDM  Screen complete  [name] [name] was evaluated in Emergency Department on 11/17/2018 for the symptoms described in the history of present illness. She was evaluated in the context of the global COVID-19 pandemic, which necessitated consideration that the patient might be at risk for infection with the SARS-CoV-2 virus that causes COVID-19. Institutional protocols and algorithms that pertain to the evaluation of patients at risk for COVID-19 are in a state of rapid change based on information released by regulatory bodies including the CDC and federal and state organizations. These policies and algorithms were followed during the patient's care in the ED.  Patient is presenting for evaluation of malaise, fatigue, and reported brief syncope.   Work-up in ED demonstrates possible UTI.  Other screening labs are without significant abnormality.  Patient is offered admission for further observation.  She declined same.  She desires discharge home.  Importance of close follow-up was stressed.  Strict return precautions given and understood.  Patient will be prescribed a short course of antibiotics for treatment of a presumed UTI. Urine Culture pending.      Final Clinical Impressions(s) / ED Diagnoses   Final diagnoses:  Urinary tract infection without hematuria, site unspecified  Syncope, unspecified syncope type    ED Discharge Orders         Ordered    cephALEXin (KEFLEX) 500 MG capsule  4 times daily     11/17/18 2016           Valarie Merino, MD 11/17/18 2019

## 2018-11-18 ENCOUNTER — Telehealth: Payer: Self-pay | Admitting: Endocrinology

## 2018-11-18 ENCOUNTER — Other Ambulatory Visit: Payer: Self-pay

## 2018-11-18 MED ORDER — LANTUS SOLOSTAR 100 UNIT/ML ~~LOC~~ SOPN: PEN_INJECTOR | SUBCUTANEOUS | 3 refills | Status: DC

## 2018-11-18 NOTE — Telephone Encounter (Signed)
MEDICATION: Lantus  PHARMACY:  [facility] on [address] A 90 DAY SUPPLY :   IS PATIENT OUT OF MEDICATION: yes  IF NOT; HOW MUCH IS LEFT:   LAST APPOINTMENT DATE: @8 /24/2020  NEXT APPOINTMENT DATE:@11 /02/2019  DO WE HAVE YOUR PERMISSION TO LEAVE A DETAILED MESSAGE: yes  OTHER COMMENTS: Dr Dwyane Dee changed her dosing on the Lantus and patient needs updated RX sent to pharmacy to reflect new dosing amounts and instructions   **Let patient know to contact pharmacy at the end of the day to make sure medication is ready. **  ** Please notify patient to allow 48-72 hours to process**  **Encourage patient to contact the pharmacy for refills or they can request refills through [facility]**

## 2018-11-18 NOTE — Telephone Encounter (Signed)
Rx sent 

## 2018-11-19 LAB — URINE CULTURE

## 2018-11-22 DIAGNOSIS — M25662 Stiffness of left knee, not elsewhere classified: Secondary | ICD-10-CM | POA: Diagnosis not present

## 2018-11-22 DIAGNOSIS — M25562 Pain in left knee: Secondary | ICD-10-CM | POA: Diagnosis not present

## 2018-11-23 ENCOUNTER — Other Ambulatory Visit: Payer: Self-pay | Admitting: Endocrinology

## 2018-11-24 ENCOUNTER — Telehealth: Payer: Self-pay | Admitting: Psychiatry

## 2018-11-24 DIAGNOSIS — Z9889 Other specified postprocedural states: Secondary | ICD-10-CM | POA: Diagnosis not present

## 2018-11-24 DIAGNOSIS — Z4789 Encounter for other orthopedic aftercare: Secondary | ICD-10-CM | POA: Diagnosis not present

## 2018-11-24 DIAGNOSIS — G2581 Restless legs syndrome: Secondary | ICD-10-CM

## 2018-11-24 NOTE — Telephone Encounter (Signed)
Tamrah called and LM that she is having a lot of problems with restless leg.  Needs something to help.  Next appt 12/02/18.

## 2018-11-24 NOTE — Telephone Encounter (Signed)
Chenise called back but she asked to be called tomorrow instead.

## 2018-11-25 ENCOUNTER — Other Ambulatory Visit: Payer: Self-pay | Admitting: Psychiatry

## 2018-11-25 DIAGNOSIS — F319 Bipolar disorder, unspecified: Secondary | ICD-10-CM

## 2018-11-25 DIAGNOSIS — F5101 Primary insomnia: Secondary | ICD-10-CM

## 2018-11-25 MED ORDER — ROPINIROLE HCL 0.5 MG PO TABS: 1.5000 mg | ORAL_TABLET | Freq: Three times a day (TID) | ORAL | 0 refills | Status: DC

## 2018-11-25 NOTE — Telephone Encounter (Signed)
Pt. Made aware and verbalized understanding. She is still taking the Gabapentin and will call us with any issues.

## 2018-11-25 NOTE — Telephone Encounter (Signed)
Spoke with patient and she stated she has  been taking the Requip 0.5 Mg x3  BID everyday. She said she does drink tea but this hasn't affected her before with the caffeine. She also stated she had knee surgery recently. Please advise.

## 2018-11-25 NOTE — Addendum Note (Signed)
Addended by: Sharyl Nimrod on: 11/25/2018 09:59 AM   Modules accepted: Orders

## 2018-11-30 ENCOUNTER — Telehealth: Payer: Self-pay | Admitting: Psychiatry

## 2018-11-30 DIAGNOSIS — Z883 Allergy status to other anti-infective agents status: Secondary | ICD-10-CM | POA: Diagnosis not present

## 2018-11-30 DIAGNOSIS — Z882 Allergy status to sulfonamides status: Secondary | ICD-10-CM | POA: Diagnosis not present

## 2018-11-30 DIAGNOSIS — G8929 Other chronic pain: Secondary | ICD-10-CM | POA: Diagnosis not present

## 2018-11-30 DIAGNOSIS — M545 Low back pain: Secondary | ICD-10-CM | POA: Diagnosis not present

## 2018-11-30 DIAGNOSIS — Z881 Allergy status to other antibiotic agents status: Secondary | ICD-10-CM | POA: Diagnosis not present

## 2018-11-30 DIAGNOSIS — Z886 Allergy status to analgesic agent status: Secondary | ICD-10-CM | POA: Diagnosis not present

## 2018-11-30 DIAGNOSIS — Z888 Allergy status to other drugs, medicaments and biological substances status: Secondary | ICD-10-CM | POA: Diagnosis not present

## 2018-11-30 DIAGNOSIS — Z885 Allergy status to narcotic agent status: Secondary | ICD-10-CM | POA: Diagnosis not present

## 2018-11-30 NOTE — Telephone Encounter (Signed)
Patient called and wants to know if she can take celexa with all of her other medicines that she takes. Please give her a call at [phone]

## 2018-12-01 NOTE — Telephone Encounter (Signed)
Advised patient okay to take short term and could effect her lithium level, pt actually has appt tomorrow and can be discussed a bit more. She said she will try it and see how it helps then go from there. Instructed to call back with any concerns.

## 2018-12-02 ENCOUNTER — Encounter: Payer: Self-pay | Admitting: Psychiatry

## 2018-12-02 ENCOUNTER — Ambulatory Visit (INDEPENDENT_AMBULATORY_CARE_PROVIDER_SITE_OTHER): Payer: Medicare Other | Admitting: Psychiatry

## 2018-12-02 ENCOUNTER — Other Ambulatory Visit: Payer: Self-pay

## 2018-12-02 DIAGNOSIS — Z79899 Other long term (current) drug therapy: Secondary | ICD-10-CM | POA: Diagnosis not present

## 2018-12-02 DIAGNOSIS — F411 Generalized anxiety disorder: Secondary | ICD-10-CM | POA: Diagnosis not present

## 2018-12-02 DIAGNOSIS — F1011 Alcohol abuse, in remission: Secondary | ICD-10-CM | POA: Diagnosis not present

## 2018-12-02 DIAGNOSIS — F319 Bipolar disorder, unspecified: Secondary | ICD-10-CM

## 2018-12-02 DIAGNOSIS — G2581 Restless legs syndrome: Secondary | ICD-10-CM

## 2018-12-02 DIAGNOSIS — F5101 Primary insomnia: Secondary | ICD-10-CM | POA: Diagnosis not present
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
## 2018-12-03 ENCOUNTER — Other Ambulatory Visit: Payer: Self-pay | Admitting: Psychiatry

## 2018-12-03 DIAGNOSIS — F319 Bipolar disorder, unspecified: Secondary | ICD-10-CM

## 2018-12-09 DIAGNOSIS — M545 Low back pain: Secondary | ICD-10-CM | POA: Diagnosis not present

## 2018-12-09 DIAGNOSIS — Z79899 Other long term (current) drug therapy: Secondary | ICD-10-CM | POA: Diagnosis not present

## 2018-12-09 DIAGNOSIS — M4316 Spondylolisthesis, lumbar region: Secondary | ICD-10-CM | POA: Diagnosis not present

## 2018-12-12 DIAGNOSIS — M1712 Unilateral primary osteoarthritis, left knee: Secondary | ICD-10-CM | POA: Diagnosis not present

## 2018-12-13 ENCOUNTER — Emergency Department (HOSPITAL_COMMUNITY)
Admission: EM | Admit: 2018-12-13 | Discharge: 2018-12-13 | Disposition: A | Discharge status: Home / Self Care | Payer: Medicare Other | Attending: Emergency Medicine | Admitting: Emergency Medicine

## 2018-12-13 ENCOUNTER — Emergency Department (HOSPITAL_COMMUNITY): Payer: Medicare Other

## 2018-12-13 ENCOUNTER — Other Ambulatory Visit: Payer: Self-pay

## 2018-12-13 ENCOUNTER — Encounter (HOSPITAL_COMMUNITY): Payer: Self-pay

## 2018-12-13 DIAGNOSIS — E1140 Type 2 diabetes mellitus with diabetic neuropathy, unspecified: Secondary | ICD-10-CM | POA: Insufficient documentation

## 2018-12-13 DIAGNOSIS — Z79899 Other long term (current) drug therapy: Secondary | ICD-10-CM | POA: Diagnosis not present

## 2018-12-13 DIAGNOSIS — R109 Unspecified abdominal pain: Secondary | ICD-10-CM | POA: Insufficient documentation

## 2018-12-13 DIAGNOSIS — Z794 Long term (current) use of insulin: Secondary | ICD-10-CM | POA: Diagnosis not present

## 2018-12-13 DIAGNOSIS — I10 Essential (primary) hypertension: Secondary | ICD-10-CM | POA: Insufficient documentation

## 2018-12-13 DIAGNOSIS — R112 Nausea with vomiting, unspecified: Secondary | ICD-10-CM | POA: Diagnosis not present

## 2018-12-13 DIAGNOSIS — Z96641 Presence of right artificial hip joint: Secondary | ICD-10-CM | POA: Insufficient documentation

## 2018-12-13 DIAGNOSIS — R1030 Lower abdominal pain, unspecified: Secondary | ICD-10-CM | POA: Diagnosis not present

## 2018-12-13 LAB — CBC
HCT: 46 % (ref 36.0–46.0)
Hemoglobin: 14.1 g/dL (ref 12.0–15.0)
MCH: 28.5 pg (ref 26.0–34.0)
MCHC: 30.7 g/dL (ref 30.0–36.0)
MCV: 93.1 fL (ref 80.0–100.0)
Platelets: 213 10*3/uL (ref 150–400)
RBC: 4.94 MIL/uL (ref 3.87–5.11)
RDW: 13.7 % (ref 11.5–15.5)
WBC: 10.2 10*3/uL (ref 4.0–10.5)
nRBC: 0 % (ref 0.0–0.2)

## 2018-12-13 LAB — URINALYSIS, ROUTINE W REFLEX MICROSCOPIC
Bilirubin Urine: NEGATIVE
Glucose, UA: NEGATIVE mg/dL
Hgb urine dipstick: NEGATIVE
Ketones, ur: NEGATIVE mg/dL
Nitrite: NEGATIVE
Protein, ur: NEGATIVE mg/dL
Specific Gravity, Urine: 1.013 (ref 1.005–1.030)
pH: 7 (ref 5.0–8.0)

## 2018-12-13 LAB — COMPREHENSIVE METABOLIC PANEL
ALT: 16 U/L (ref 0–44)
AST: 15 U/L (ref 15–41)
Albumin: 3.8 g/dL (ref 3.5–5.0)
Alkaline Phosphatase: 103 U/L (ref 38–126)
Anion gap: 10 (ref 5–15)
BUN: 11 mg/dL (ref 8–23)
CO2: 26 mmol/L (ref 22–32)
Calcium: 10.2 mg/dL (ref 8.9–10.3)
Chloride: 104 mmol/L (ref 98–111)
Creatinine, Ser: 0.83 mg/dL (ref 0.44–1.00)
GFR calc Af Amer: >60 mL/min (ref >60)
GFR calc non Af Amer: >60 mL/min (ref >60)
Glucose, Bld: 162 mg/dL — ABNORMAL HIGH (ref 70–99)
Potassium: 4.2 mmol/L (ref 3.5–5.1)
Sodium: 140 mmol/L (ref 135–145)
Total Bilirubin: 0.5 mg/dL (ref 0.3–1.2)
Total Protein: 7 g/dL (ref 6.5–8.1)

## 2018-12-13 LAB — LIPASE, BLOOD: Lipase: 27 U/L (ref 11–51)

## 2018-12-13 MED ORDER — OXYCODONE-ACETAMINOPHEN 5-325 MG PO TABS: 1.0000 | ORAL_TABLET | Freq: Four times a day (QID) | ORAL | 0 refills | Status: DC | PRN

## 2018-12-13 MED ORDER — METRONIDAZOLE 500 MG PO TABS: 500.0000 mg | ORAL_TABLET | Freq: Three times a day (TID) | ORAL | 0 refills | Status: AC

## 2018-12-13 MED ORDER — CIPROFLOXACIN HCL 500 MG PO TABS: 500.0000 mg | ORAL_TABLET | Freq: Two times a day (BID) | ORAL | 0 refills | Status: AC

## 2018-12-13 MED ORDER — SODIUM CHLORIDE 0.9% FLUSH: 3.0000 mL | Freq: Once | INTRAVENOUS | Status: DC

## 2018-12-13 MED ORDER — ONDANSETRON 8 MG PO TBDP
8.0000 mg | ORAL_TABLET | Freq: Once | ORAL | Status: AC
  Administered 2018-12-13: 8 mg via ORAL
  Filled 2018-12-13: qty 1

## 2018-12-13 MED ORDER — OXYCODONE-ACETAMINOPHEN 5-325 MG PO TABS
2.0000 | ORAL_TABLET | Freq: Once | ORAL | Status: AC
  Administered 2018-12-13: 2 via ORAL
  Filled 2018-12-13: qty 2

## 2018-12-13 NOTE — ED Provider Notes (Signed)
[facility] DEPT Provider Note   CSN: [id number] Arrival date & time: 12/13/18  0340     History   Chief Complaint Chief Complaint  Patient presents with  . Abdominal Pain    HPI [name] [name] is a 73 y.o. female.     73 year old female with a history of bipolar disorder, anxiety, dyslipidemia, hypertension, DM, fibromyalgia presents to the emergency department expressing concern for diverticulitis or colitis.  She states that she has been experiencing intermittent, waxing and waning pain in her lower abdomen for the past week.  She feels that the pain radiates to her back at times.  She has had some nausea, but no vomiting.  Has tried ibuprofen for pain at home without relief.  Feels that she has been more constipated lately.  Did have a bowel movement earlier today.  Abdominal surgical history significant for tubal ligation, appendectomy, hysterectomy.  No fevers, melena, hematochezia, urinary symptoms.   The history is provided by the patient. No language interpreter was used.  Abdominal Pain   Past Medical History:  Diagnosis Date  . Alcohol abuse   . Anxiety    takes Valium daily as needed and Ativan daily  . Bilateral hearing loss   . Bipolar 1 disorder ([place])    takes Lithium nightly and Synthroid daily  . Chronic back pain    DDD; "all over" (09/14/2017)  . Colitis, ischemic ([place]) 2012  . Confusion    r/t meds  . Depression    takes Prozac daily and Bupspirone   . Diverticulosis   . Dyslipidemia    takes Crestor daily  . Fibromyalgia   . Headache    "weekly" (09/14/2017)  . Hepatic steatosis 06/18/12   severe  . History of kidney stones   . Hyperlipidemia   . Hypertension   . Hypothyroidism   . IBS (irritable bowel syndrome)   . Ischemic colitis ([place])   . Joint pain   . Joint swelling   . Lupus erythematosus tumidus    tumid-skin  . Migraine    "1-2/yr; maybe" (09/14/2017)  . Numbness    in right foot  . Osteoarthritis    "all over" (09/14/2017)  . Osteoarthritis cervical spine   . Osteoarthritis of hand    bilateral  . Pneumonia    "walking pneumonia several times; long time since the last time" (09/14/2017)  . Restless leg syndrome    takes Requip nightly  . Sciatica   . Type II diabetes mellitus ([place])   . Urinary frequency   . Urinary leakage   . Urinary urgency   . Urinary, incontinence, stress female   . Walking pneumonia    last time more than 84yrs ago    Patient Active Problem List   Diagnosis Date Noted  . Chest pain   . Chest pain with normal coronary angiography   . Diverticulitis 11/19/2016  . Hypothyroidism 07/14/2016  . Diarrhea   . Generalized abdominal pain   . Major depressive disorder, recurrent severe without psychotic features ([place]) 12/31/2015  . Nausea without vomiting   . Colitis 10/28/2015  . Unintentional poisoning by psychotropic drug 07/05/2015  . Restless leg syndrome 07/05/2015  . Diabetic polyneuropathy associated with type 2 diabetes mellitus ([place]) 09/21/2014  . Arthritis of left hip 06/09/2013  . Arthritis of right hip 06/08/2013  . Hip pain, right 05/02/2013  . IBS (irritable bowel syndrome) 01/02/2013  . Unspecified vitamin D deficiency 07/05/2012  . Pure hyperglyceridemia 07/04/2012  . Diabetes  mellitus with diabetic neuropathy, without long-term current use of insulin ([place]) 06/18/2012  . Hypertension 06/18/2012  . Bipolar 1 disorder ([place]) 06/18/2012  . Low back pain 02/25/2011  . Acute ischemic colitis ([place]) 01/06/2011  . Sciatica 12/19/2010  . Fibromyalgia 10/28/2010  . Primary osteoarthritis of right hip 10/24/2010  . Hyperlipidemia with target low density lipoprotein (LDL) cholesterol less than 100 mg/dL 05/05/2010  . DEPRESSION/ANXIETY 05/05/2010  . ABUSE, ALCOHOL, IN REMISSION 05/05/2010  . OTITIS MEDIA, CHRONIC 05/05/2010  . Hearing loss 05/05/2010  . ALLERGIC RHINITIS, SEASONAL, MILD 05/05/2010  . URINARY INCONTINENCE, STRESS, FEMALE 05/05/2010   . OSTEOARTHRITIS, HANDS, BILATERAL 05/05/2010  . OSTEOARTHRITIS, CERVICAL SPINE 05/05/2010    Past Surgical History:  Procedure Laterality Date  . ABDOMINAL HYSTERECTOMY     "they left my ovaries"  . APPENDECTOMY    . CARDIAC CATHETERIZATION  09/14/2017  . COLONOSCOPY    . DENTAL SURGERY Left 10/2016   dental implant  . DILATION AND CURETTAGE OF UTERUS  X 4  . ESOPHAGOGASTRODUODENOSCOPY    . FLEXIBLE SIGMOIDOSCOPY N/A 06/21/2012   Procedure: FLEXIBLE SIGMOIDOSCOPY;  Surgeon: Jerene Bears, MD;  Location: [facility];  Service: Gastroenterology;  Laterality: N/A;  . JOINT REPLACEMENT    . LEFT HEART CATH AND CORONARY ANGIOGRAPHY N/A 09/14/2017   Procedure: LEFT HEART CATH AND CORONARY ANGIOGRAPHY;  Surgeon: Belva Crome, MD;  Location: [facility];  Service: Cardiovascular;  Laterality: N/A;  . SHOULDER ARTHROSCOPY Right    "shaved spurs off rotator cuff"  . TONSILLECTOMY    . TOTAL HIP ARTHROPLASTY Right 06/09/2013   Procedure: TOTAL HIP ARTHROPLASTY;  Surgeon: Kerin Salen, MD;  Location: [facility];  Service: Orthopedics;  Laterality: Right;  . TUBAL LIGATION    . TUMOR EXCISION Right 1968   angle of jaw; benign     OB History   No obstetric history on file.      Home Medications    Prior to Admission medications   Medication Sig Start Date End Date Taking? Authorizing Provider  ALPRAZolam Duanne Moron) 0.5 MG tablet Take 1 tablet (0.5 mg total) by mouth 4 (four) times daily as needed for anxiety. 11/04/18   [name], PMHNP  amoxicillin (AMOXIL) 500 MG tablet TK 1 T PO TID TAT 11/28/18   [provider]  atorvastatin (LIPITOR) 40 MG tablet Take 40 mg by mouth at bedtime.     [provider]  BD PEN NEEDLE NANO U/F 32G X 4 MM MISC USE TO INJECT 5 TIMES DAILY AS DIRECTED 02/07/18   Elayne Snare, MD  cephALEXin (KEFLEX) 500 MG capsule Take 1 capsule (500 mg total) by mouth 4 (four) times daily. Patient not taking: Reported on 12/02/2018 11/17/18   Valarie Merino, MD  cholecalciferol (VITAMIN D) 1000 units tablet Take 2,000 Units by mouth daily.     [provider]  ciprofloxacin (CIPRO) 500 MG tablet Take 1 tablet (500 mg total) by mouth every 12 (twelve) hours for 10 days. 12/13/18 12/23/18  Antonietta Breach, PA-C  dicyclomine (BENTYL) 10 MG capsule Take 1 capsule (10 mg total) by mouth every 8 (eight) hours as needed for spasms. Patient not taking: Reported on 11/17/2018 06/30/18   Doran Stabler, MD  diphenoxylate-atropine (LOMOTIL) 2.5-0.025 MG tablet Take 1 tablet by mouth 2 (two) times daily as needed for diarrhea or loose stools. Patient not taking: Reported on 11/17/2018 05/31/18   Doran Stabler, MD  divalproex (DEPAKOTE ER) 250 MG  24 hr tablet TAKE 2 TABLETS(500 MG) BY MOUTH AT BEDTIME Patient taking differently: Take 500 mg by mouth at bedtime.  09/10/18   [name], PMHNP  donepezil (ARICEPT) 10 MG tablet Take 1 tablet at Bedtime 01/11/18   [name], PMHNP  gabapentin (NEURONTIN) 400 MG capsule Take 400 mg by mouth 3 (three) times daily.     [provider]  glucose blood (CONTOUR NEXT TEST) test strip Use to test blood sugar 2 times daily 04/28/18   Elayne Snare, MD  Insulin Glargine (LANTUS SOLOSTAR) 100 UNIT/ML Solostar Pen Inject 48 units under the skin once daily. 11/18/18   Elayne Snare, MD  insulin lispro (HUMALOG) 100 UNIT/ML injection Inject 6-8 Units into the skin 3 (three) times daily with meals. Take 6 units in the morning, 6 units at noon and Take 8 units at 5 pm.    [provider]  levothyroxine (SYNTHROID) 75 MCG tablet TAKE 1 TABLET BY MOUTH DAILY BEFORE BREAKFAST Patient taking differently: Take 75 mcg by mouth daily before breakfast.  10/31/18   Elayne Snare, MD  lithium carbonate (ESKALITH) 450 MG CR tablet TAKE 1 TABLET(450 MG) BY MOUTH AT BEDTIME 12/05/18   [name], PMHNP  losartan (COZAAR) 50 MG tablet Take 25 mg by mouth daily.     [provider]  Melatonin 3 MG TABS  Take 9 mg by mouth at bedtime.    [provider]  memantine (NAMENDA) 10 MG tablet Take 1 tablet (10 mg total) by mouth every evening. 10/27/18 01/25/19  [name], PMHNP  Mesalamine 800 MG TBEC Take 2 tablets (1,600 mg total) by mouth 3 (three) times daily for 3 days. 06/30/18 07/03/18  Doran Stabler, MD  metroNIDAZOLE (FLAGYL) 500 MG tablet Take 1 tablet (500 mg total) by mouth 3 (three) times daily for 10 days. 12/13/18 12/23/18  Antonietta Breach, PA-C  Microlet Lancets MISC USE DAILY AS DIRECTED 10/24/18   Elayne Snare, MD  nitroGLYCERIN (NITROSTAT) 0.4 MG SL tablet Place 1 tablet (0.4 mg total) under the tongue every 5 (five) minutes as needed for chest pain. 09/14/17   Lyda Jester M, PA-C  ondansetron (ZOFRAN ODT) 4 MG disintegrating tablet 4mg  ODT q4 hours prn nausea/vomit 05/19/18   Milton Ferguson, MD  ondansetron (ZOFRAN) 8 MG tablet Take 1 tablet (8 mg total) by mouth every 8 (eight) hours as needed for nausea. Patient not taking: Reported on 11/17/2018 01/01/18   Orlie Dakin, MD  oxyCODONE (OXY IR/ROXICODONE) 5 MG immediate release tablet Take 5 mg by mouth every 6 (six) hours as needed for moderate pain or severe pain.     [provider]  Oxycodone HCl 10 MG TABS Take 10 mg by mouth every 6 (six) hours as needed (pain).  11/02/18   [provider]  oxyCODONE-acetaminophen (PERCOCET/ROXICET) 5-325 MG tablet Take 1 tablet by mouth every 6 (six) hours as needed for severe pain. 12/13/18   Antonietta Breach, PA-C  OZEMPIC, 1 MG/DOSE, 2 MG/1.5ML SOPN INJECT 1MG  INTO THE SKIN ONCE A WEEK AS DIRECTED 11/23/18   Elayne Snare, MD  polyethylene glycol [facility]) packet Take 17 g by mouth daily as needed. 11/20/16   Lavina Hamman, MD  QUEtiapine (SEROQUEL) 100 MG tablet TAKE 1 AND 1/2 TABLETS BY MOUTH EVERY MORNING AND 2 TABLETS EVERY NIGHT AT BEDTIME Patient taking differently: Take 200 mg by mouth 2 (two) times daily.  11/04/18   [name], PMHNP  rOPINIRole  (REQUIP) 0.5 MG tablet Take 3  tablets (1.5 mg total) by mouth 3 (three) times daily. 11/25/18   [name], PMHNP  traMADol (ULTRAM) 50 MG tablet Take 1 tablet (50 mg total) by mouth every 6 (six) hours as needed for up to 20 doses. Patient not taking: Reported on 11/17/2018 06/06/18   Doran Stabler, MD  zaleplon (SONATA) 5 MG capsule Take 1 capsule (5 mg total) by mouth at bedtime. Patient not taking: Reported on 11/04/2018 04/14/18   [name], PMHNP    Family History Family History  Problem Relation Age of Onset  . Drug abuse Mother   . Alcohol abuse Mother   . Alcohol abuse Father   . Hypertension Father   . CAD Brother   . Hypertension Brother   . Alcohol abuse Brother   . Hypertension Brother   . Hypertension Brother   . Psoriasis Daughter   . Arthritis Daughter        psoriatic arthritis   . Alcohol abuse Grandchild     Social History Social History   Tobacco Use  . Smoking status: Never Smoker  . Smokeless tobacco: Never Used  Substance Use Topics  . Alcohol use: Not Currently    Comment: 09/14/2017 "nothing since 04/15/2008"  . Drug use: Not on file     Allergies   Codeine, Macrolides and ketolides, Procaine hcl, Aspirin, Benadryl [diphenhydramine], Diflucan [fluconazole], Dilaudid [hydromorphone hcl], Other, and Sulfa antibiotics   Review of Systems Review of Systems  Gastrointestinal: Positive for abdominal pain.  Ten systems reviewed and are negative for acute change, except as noted in the HPI.    Physical Exam Updated Vital Signs BP 131/81 (BP Location: Right Arm)   Pulse 99   Temp 98.4 F (36.9 C) (Oral)   Resp 17   Ht 5\' 1"  (1.549 m)   Wt 76.2 kg   SpO2 100%   BMI 31.74 kg/m   Physical Exam Vitals signs and nursing note reviewed.  Constitutional:      General: She is not in acute distress.    Appearance: She is well-developed. She is not diaphoretic.     Comments: Nontoxic appearing and in NAD  HENT:     Head: Normocephalic and  atraumatic.  Eyes:     General: No scleral icterus.    Conjunctiva/sclera: Conjunctivae normal.  Neck:     Musculoskeletal: Normal range of motion.  Cardiovascular:     Rate and Rhythm: Normal rate and regular rhythm.     Pulses: Normal pulses.  Pulmonary:     Effort: Pulmonary effort is normal. No respiratory distress.     Comments: Respirations even and unlabored Abdominal:     Palpations: Abdomen is soft. There is no mass.     Tenderness: There is no guarding.     Comments: Mild TTP across the lower abdomen. There is no guarding. No distension, palpable masses, peritoneal signs.  Musculoskeletal: Normal range of motion.  Skin:    General: Skin is warm and dry.     Coloration: Skin is not pale.     Findings: No erythema or rash.  Neurological:     General: No focal deficit present.     Mental Status: She is alert and oriented to person, place, and time.     Coordination: Coordination normal.     Comments: GCS 15. Patient moving all extremities spontaneously.  Psychiatric:        Behavior: Behavior normal.      ED Treatments / Results  Labs (all labs  ordered are listed, but only abnormal results are displayed) Labs Reviewed  COMPREHENSIVE METABOLIC PANEL - Abnormal; Notable for the following components:      Result Value   Glucose, Bld 162 (*)    All other components within normal limits  URINALYSIS, ROUTINE W REFLEX MICROSCOPIC - Abnormal; Notable for the following components:   Leukocytes,Ua TRACE (*)    Bacteria, UA RARE (*)    All other components within normal limits  LIPASE, BLOOD  CBC    EKG None  Radiology Dg Abd 2 Views  Result Date: 12/13/2018 CLINICAL DATA:  Nausea and vomiting for 24 hours EXAM: ABDOMEN - 2 VIEW COMPARISON:  11/21/2018 FINDINGS: The bowel gas pattern is normal. There is no evidence of free air. No radio-opaque calculi or other significant radiographic abnormality is seen. Mild dextroscoliosis of the lumbar spine.  Right hip  arthroplasty IMPRESSION: Normal bowel gas pattern. Electronically Signed   By: Monte Fantasia M.D.   On: 12/13/2018 06:38    Procedures Procedures (including critical care time)  Medications Ordered in ED Medications  sodium chloride flush (NS) 0.9 % injection 3 mL (has no administration in time range)  oxyCODONE-acetaminophen (PERCOCET/ROXICET) 5-325 MG per tablet 2 tablet (2 tablets Oral Given 12/13/18 0556)  ondansetron (ZOFRAN-ODT) disintegrating tablet 8 mg (8 mg Oral Given 12/13/18 0556)     Initial Impression / Assessment and Plan / ED Course  I have reviewed the triage vital signs and the nursing notes.  Pertinent labs & imaging results that were available during my care of the patient were reviewed by me and considered in my medical decision making (see chart for details).        41:25 AM 73 year old female presents to the emergency department for complaints of intermittent lower abdominal pain.  Reports history of diverticulitis or colitis which feels similar in nature to the pain she is experiencing tonight.  Patient appears well.  She is afebrile with stable vital signs.  Remainder labs pending, though noted to have no leukocytosis.  Does not presently meet criteria for SIRS/Sepsis.  On chart review, patient has had 5 CT scans of her abdomen/pelvis in the past year for complaints of abdominal pain.  While some of these scans have been completely benign, others have showed evidence of early diverticulitis or colitis.  Plan to obtain x-ray to rule out SBO and free air.  If x-ray is reassuring, discussed prophylactic treatment with ciprofloxacin and Flagyl and outpatient primary care follow-up.  Patient verbalizes understanding.  6:41 AM X-ray findings are reassuring without evidence of obstruction or free air.  She has no electrolyte derangements.  Liver and kidney function preserved.  Normal lipase.  Mild pyuria on urinalysis.  May be contaminant, though nonspecific.  Should be  adequately covered with ciprofloxacin if this were to represent infection.  Patient also to be prescribed Flagyl.  She was given a short course of oxycodone for home use and pain control.  Return precautions discussed and provided. Patient discharged in stable condition with no unaddressed concerns.   Final Clinical Impressions(s) / ED Diagnoses   Final diagnoses:  Abdominal pain    ED Discharge Orders         Ordered    ciprofloxacin (CIPRO) 500 MG tablet  Every 12 hours     12/13/18 0637    metroNIDAZOLE (FLAGYL) 500 MG tablet  3 times daily     12/13/18 0637    oxyCODONE-acetaminophen (PERCOCET/ROXICET) 5-325 MG tablet  Every 6 hours PRN  12/13/18 [place], Lyn Joens, PA-C 12/13/18 [id number]    [place], April, MD 12/13/18 [place], April, MD 12/13/18 [id number]

## 2018-12-13 NOTE — ED Triage Notes (Signed)
Pt arrived stating she is having lower abdominal pain for the last week that increased tonight, hx of colitis and diverticulitis. Pt states she has been constipated and nauseated.

## 2018-12-13 NOTE — Discharge Instructions (Signed)
Your labs and x-ray in the ED were reassuring.  Ou are being discharged with ciprofloxacin and Flagyl for treatment of presumed diverticulitis.  Take these antibiotics as prescribed until finished.  You may use oxycodone as needed for management of severe pain.  We recommend that you continue on a clear liquid diet until your abdominal pain resolves.  Follow-up with your primary care doctor to ensure resolution of symptoms.

## 2018-12-13 NOTE — ED Notes (Signed)
Pt verbalized discharge instructions and follow up care. Alert and ambulatory. No IV. Calling [employer] for ride home

## 2018-12-18 ENCOUNTER — Telehealth: Payer: Self-pay | Admitting: Psychiatry

## 2018-12-18 NOTE — Telephone Encounter (Signed)
After-hours phone call about not sleeping more than 2 hours nightly the last week is delayed in reply by exchange of messages as answering service gives me the number they detect on caller ID but that phone does not ring at patient's house, though she does seem to possibly be able to to detect that a call was received finally giving answering service on the third call to them [phone] for my return call instead of the ineffective [phone].  Having bipolar 1, history of alcohol problem, and very long list of medications currently and in the past, she concludes that she will not be going up on Seroquel to help but that she prefers to [name] she has on hand not taken for months being the 5 mg formulation as either 1 or 2 tonight to see if she can restore her sleep from the deprived state.  She prefers trying this tonight instead of doxepin 10 mg being sent to the pharmacy.  She understands how to follow-up with the office as to efficacy and next steps if needed.

## 2018-12-20 ENCOUNTER — Telehealth: Payer: Self-pay | Admitting: Psychiatry

## 2018-12-20 NOTE — Telephone Encounter (Signed)
error 

## 2018-12-21 DIAGNOSIS — M1712 Unilateral primary osteoarthritis, left knee: Secondary | ICD-10-CM | POA: Diagnosis not present

## 2018-12-22 DIAGNOSIS — M1712 Unilateral primary osteoarthritis, left knee: Secondary | ICD-10-CM | POA: Diagnosis not present

## 2018-12-22 DIAGNOSIS — M25462 Effusion, left knee: Secondary | ICD-10-CM | POA: Diagnosis not present

## 2018-12-22 DIAGNOSIS — Z9889 Other specified postprocedural states: Secondary | ICD-10-CM | POA: Diagnosis not present

## 2018-12-22 DIAGNOSIS — M898X6 Other specified disorders of bone, lower leg: Secondary | ICD-10-CM | POA: Diagnosis not present

## 2018-12-23 ENCOUNTER — Ambulatory Visit (INDEPENDENT_AMBULATORY_CARE_PROVIDER_SITE_OTHER): Payer: Medicare Other | Admitting: Psychiatry

## 2018-12-23 ENCOUNTER — Other Ambulatory Visit: Payer: Self-pay | Admitting: Psychiatry

## 2018-12-23 ENCOUNTER — Encounter: Payer: Self-pay | Admitting: Psychiatry

## 2018-12-23 ENCOUNTER — Other Ambulatory Visit: Payer: Self-pay

## 2018-12-23 DIAGNOSIS — F5101 Primary insomnia: Secondary | ICD-10-CM

## 2018-12-23 DIAGNOSIS — F411 Generalized anxiety disorder: Secondary | ICD-10-CM

## 2018-12-23 DIAGNOSIS — F319 Bipolar disorder, unspecified: Secondary | ICD-10-CM | POA: Diagnosis not present

## 2018-12-23 MED ORDER — DOXEPIN HCL 10 MG PO CAPS: 10.0000 mg | ORAL_CAPSULE | Freq: Every evening | ORAL | 1 refills | Status: DC | PRN
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
## 2018-12-27 ENCOUNTER — Other Ambulatory Visit: Payer: Self-pay | Admitting: Psychiatry

## 2018-12-27 DIAGNOSIS — F5101 Primary insomnia: Secondary | ICD-10-CM

## 2018-12-27 MED ORDER — DONEPEZIL HCL 10 MG PO TABS: ORAL_TABLET | ORAL | 1 refills | Status: DC

## 2018-12-28 DIAGNOSIS — M25562 Pain in left knee: Secondary | ICD-10-CM | POA: Diagnosis not present

## 2018-12-28 DIAGNOSIS — M1712 Unilateral primary osteoarthritis, left knee: Secondary | ICD-10-CM | POA: Diagnosis not present

## 2018-12-30 ENCOUNTER — Other Ambulatory Visit: Payer: Self-pay | Admitting: Orthopedic Surgery

## 2019-01-02 ENCOUNTER — Other Ambulatory Visit: Payer: Self-pay | Admitting: Orthopedic Surgery

## 2019-01-02 DIAGNOSIS — M25562 Pain in left knee: Secondary | ICD-10-CM

## 2019-01-05 DIAGNOSIS — R5383 Other fatigue: Secondary | ICD-10-CM | POA: Diagnosis not present

## 2019-01-05 DIAGNOSIS — R112 Nausea with vomiting, unspecified: Secondary | ICD-10-CM | POA: Diagnosis not present

## 2019-01-06 DIAGNOSIS — R5383 Other fatigue: Secondary | ICD-10-CM | POA: Diagnosis not present

## 2019-01-06 DIAGNOSIS — R112 Nausea with vomiting, unspecified: Secondary | ICD-10-CM | POA: Diagnosis not present

## 2019-01-09 DIAGNOSIS — M25662 Stiffness of left knee, not elsewhere classified: Secondary | ICD-10-CM | POA: Diagnosis not present

## 2019-01-09 DIAGNOSIS — M1712 Unilateral primary osteoarthritis, left knee: Secondary | ICD-10-CM | POA: Diagnosis not present

## 2019-01-09 DIAGNOSIS — M25562 Pain in left knee: Secondary | ICD-10-CM | POA: Diagnosis not present

## 2019-01-12 DIAGNOSIS — Z01818 Encounter for other preprocedural examination: Secondary | ICD-10-CM | POA: Diagnosis not present

## 2019-01-12 DIAGNOSIS — E041 Nontoxic single thyroid nodule: Secondary | ICD-10-CM | POA: Diagnosis not present

## 2019-01-12 DIAGNOSIS — E1165 Type 2 diabetes mellitus with hyperglycemia: Secondary | ICD-10-CM | POA: Diagnosis not present

## 2019-01-12 DIAGNOSIS — M25562 Pain in left knee: Secondary | ICD-10-CM | POA: Diagnosis not present

## 2019-01-12 DIAGNOSIS — E039 Hypothyroidism, unspecified: Secondary | ICD-10-CM | POA: Diagnosis not present

## 2019-01-12 DIAGNOSIS — I10 Essential (primary) hypertension: Secondary | ICD-10-CM | POA: Diagnosis not present

## 2019-01-15 ENCOUNTER — Ambulatory Visit
Admission: RE | Admit: 2019-01-15 | Discharge: 2019-01-15 | Disposition: A | Discharge status: Home / Self Care | Payer: Medicare Other | Source: Ambulatory Visit | Attending: Orthopedic Surgery | Admitting: Orthopedic Surgery

## 2019-01-15 ENCOUNTER — Other Ambulatory Visit: Payer: Self-pay

## 2019-01-15 DIAGNOSIS — M25562 Pain in left knee: Secondary | ICD-10-CM | POA: Diagnosis not present

## 2019-01-16 ENCOUNTER — Other Ambulatory Visit: Payer: Self-pay | Admitting: Family Medicine

## 2019-01-16 DIAGNOSIS — E041 Nontoxic single thyroid nodule: Secondary | ICD-10-CM

## 2019-01-19 ENCOUNTER — Other Ambulatory Visit: Payer: Medicare Other

## 2019-01-19 ENCOUNTER — Other Ambulatory Visit: Payer: Self-pay

## 2019-01-19 DIAGNOSIS — M1712 Unilateral primary osteoarthritis, left knee: Secondary | ICD-10-CM | POA: Diagnosis not present

## 2019-01-19 DIAGNOSIS — M25562 Pain in left knee: Secondary | ICD-10-CM | POA: Diagnosis not present

## 2019-01-23 ENCOUNTER — Ambulatory Visit: Payer: Medicare Other | Admitting: Endocrinology

## 2019-01-23 ENCOUNTER — Other Ambulatory Visit: Payer: Self-pay

## 2019-01-23 ENCOUNTER — Emergency Department (HOSPITAL_COMMUNITY)
Admission: EM | Admit: 2019-01-23 | Discharge: 2019-01-23 | Disposition: A | Discharge status: Home / Self Care | Payer: Medicare Other | Attending: Emergency Medicine | Admitting: Emergency Medicine

## 2019-01-23 DIAGNOSIS — Z5321 Procedure and treatment not carried out due to patient leaving prior to being seen by health care provider: Secondary | ICD-10-CM | POA: Diagnosis not present

## 2019-01-23 DIAGNOSIS — R109 Unspecified abdominal pain: Secondary | ICD-10-CM | POA: Insufficient documentation

## 2019-01-23 DIAGNOSIS — K5900 Constipation, unspecified: Secondary | ICD-10-CM | POA: Diagnosis not present

## 2019-01-23 LAB — COMPREHENSIVE METABOLIC PANEL
ALT: 19 U/L (ref 0–44)
AST: 20 U/L (ref 15–41)
Albumin: 4.1 g/dL (ref 3.5–5.0)
Alkaline Phosphatase: 89 U/L (ref 38–126)
Anion gap: 14 (ref 5–15)
BUN: 15 mg/dL (ref 8–23)
CO2: 24 mmol/L (ref 22–32)
Calcium: 10.9 mg/dL — ABNORMAL HIGH (ref 8.9–10.3)
Chloride: 102 mmol/L (ref 98–111)
Creatinine, Ser: 0.89 mg/dL (ref 0.44–1.00)
GFR calc Af Amer: >60 mL/min (ref >60)
GFR calc non Af Amer: >60 mL/min (ref >60)
Glucose, Bld: 141 mg/dL — ABNORMAL HIGH (ref 70–99)
Potassium: 4.9 mmol/L (ref 3.5–5.1)
Sodium: 140 mmol/L (ref 135–145)
Total Bilirubin: 1 mg/dL (ref 0.3–1.2)
Total Protein: 7.2 g/dL (ref 6.5–8.1)

## 2019-01-23 LAB — CBC
HCT: 48.1 % — ABNORMAL HIGH (ref 36.0–46.0)
Hemoglobin: 15 g/dL (ref 12.0–15.0)
MCH: 28.8 pg (ref 26.0–34.0)
MCHC: 31.2 g/dL (ref 30.0–36.0)
MCV: 92.3 fL (ref 80.0–100.0)
Platelets: 249 10*3/uL (ref 150–400)
RBC: 5.21 MIL/uL — ABNORMAL HIGH (ref 3.87–5.11)
RDW: 13.6 % (ref 11.5–15.5)
WBC: 10.2 10*3/uL (ref 4.0–10.5)
nRBC: 0 % (ref 0.0–0.2)

## 2019-01-23 LAB — LIPASE, BLOOD: Lipase: 37 U/L (ref 11–51)

## 2019-01-23 NOTE — ED Notes (Signed)
Pt would like to leave, advised against leaving.

## 2019-01-23 NOTE — ED Triage Notes (Signed)
Pt endorses left sided abd pain with nausea since early this morning. Sent by [facility] walk in clinic due to hx of colitis and diverticulitis. VSS.

## 2019-01-25 ENCOUNTER — Encounter: Payer: Self-pay | Admitting: Endocrinology

## 2019-01-25 ENCOUNTER — Other Ambulatory Visit: Payer: Self-pay

## 2019-01-25 ENCOUNTER — Ambulatory Visit (INDEPENDENT_AMBULATORY_CARE_PROVIDER_SITE_OTHER): Payer: Medicare Other | Admitting: Endocrinology

## 2019-01-25 DIAGNOSIS — Z794 Long term (current) use of insulin: Secondary | ICD-10-CM

## 2019-01-25 DIAGNOSIS — E1165 Type 2 diabetes mellitus with hyperglycemia: Secondary | ICD-10-CM

## 2019-01-25 NOTE — Progress Notes (Signed)
Patient ID: [name] [name], female   DOB: 1945/08/11, 73 y.o.   MRN: [id number]            Reason for Appointment: Follow-up for Type 2 Diabetes  Today's office visit was provided via telemedicine using a telephone call to the patient Patient has been explained the limitations of evaluation and management by telemedicine and the availability of in person appointments.  The patient understood the limitations and agreed to proceed. Patient also understood that the telehealth visit is billable. . Location of the patient: Home . Location of the provider: Office Only the patient and myself were participating in the encounter    History of Present Illness:          Date of diagnosis of type 2 diabetes mellitus: ?  2014        Background history:   She thinks her blood sugar was 400 at the time of diagnosis but no detailed records of this are available She did have an A1c of 10.6 done in 2014 and was probably given Lantus for some time initially Apparently she was treated with various medications including metformin, Janumet and Tradgenta. Her blood sugars had improved and A1c in 2015 was down to 6.2 She tends to have diarrhea with metformin and Janumet and also she thinks it causes dry mouth Most likely Janumet was stopped in 06/2015 Glipizide was started in 8/17 when blood sugars were higher and A1c was 9%  Recent history:   INSULIN regimen is:   Lantus 54 units daily at 5 pm  Humalog: Not taking  Non-insulin hypoglycemic drugs the patient is taking are: Ozempic1 mg weekly  Her A1c is about the same at 6.2 compared to 6.3, this was done by PCP   Current management, blood sugar patterns and problems identified:  Her blood sugars were reviewed from her meter at home  As before she forgets to check readings after meals and has no readings after meals at all  She cannot explain why her fasting readings fluctuate although recently has only a couple of relatively high readings   Recent range 105-170, no average available  Diet can be inconsistent  However in the last couple of months she thinks that her appetite is overall decreased and she is losing weight  Also having occasional nausea which she did not have with Ozempic previously  Lantus was increased on her last visit and she has not reduced it back again even with not getting any steroids   Side effects from medications have been:?  Diarrhea from metformin and Janumet  Compliance with the medical regimen: Inconsistent  Glucose monitoring:  done 1 time a day or less        Glucometer: Contour        Blood Glucose readings as above  Previous readings  PRE-MEAL  early a.m. Lunch Dinner Bedtime Overall  Glucose range:  93-172    128-157   Mean/median:      141    Self-care: The diet that the patient has been following [id number], tries to limit drinks with sugar .      Typical meal intake: Breakfast at 9,  May be eggs and sausage and lunch is usually a sandwich   Dinner 7 pm               Dietician visit, most recent: 10/18                Weight history:   Wt Readings from Last 3  Encounters:  12/13/18 168 lb (76.2 kg)  11/17/18 170 lb (77.1 kg)  10/28/18 167 lb 9.6 oz (76 kg)    Glycemic control:   Lab Results  Component Value Date   HGBA1C 6.3 10/25/2018   HGBA1C 7.5 (H) 05/04/2018   HGBA1C 7.0 (H) 12/21/2017   Lab Results  Component Value Date   MICROALBUR <0.7 05/04/2018   CREATININE 0.89 01/23/2019   Lab Results  Component Value Date   MICRALBCREAT 1.9 05/04/2018       Allergies as of 01/25/2019      Reactions   Codeine Anxiety, Other (See Comments)   Reaction:  Hallucinations   Macrolides And Ketolides Nausea And Vomiting   Procaine Hcl Palpitations   Aspirin Nausea And Vomiting, Other (See Comments)   Reaction:  Burns pts stomach    Benadryl [diphenhydramine] Other (See Comments)   Per MD "inhibits potency of gabapentin, lithium etc"   Diflucan [fluconazole] Other  (See Comments)   Reaction:  Unknown    Dilaudid [hydromorphone Hcl] Other (See Comments)   Reaction:  Migraines and nightmares    Other Nausea And Vomiting, Other (See Comments)   Pt states that all -mycins cause N/V.    Sulfa Antibiotics Other (See Comments)   Reaction:  Unknown       Medication List       Accurate as of January 25, 2019  1:42 PM. If you have any questions, ask your nurse or doctor.        STOP taking these medications   amoxicillin 500 MG tablet Commonly known as: AMOXIL Stopped by: Elayne Snare, MD   cephALEXin 500 MG capsule Commonly known as: KEFLEX Stopped by: Elayne Snare, MD   insulin lispro 100 UNIT/ML injection Commonly known as: HUMALOG Stopped by: Elayne Snare, MD     TAKE these medications   ALPRAZolam 0.5 MG tablet Commonly known as: XANAX Take 1 tablet (0.5 mg total) by mouth 4 (four) times daily as needed for anxiety.   atorvastatin 40 MG tablet Commonly known as: LIPITOR Take 40 mg by mouth daily at 6 PM.   BD Pen Needle Nano U/F 32G X 4 MM Misc Generic drug: Insulin Pen Needle USE TO INJECT 5 TIMES DAILY AS DIRECTED   cholecalciferol 1000 units tablet Commonly known as: VITAMIN D Take 2,000 Units by mouth daily.   dicyclomine 10 MG capsule Commonly known as: BENTYL Take 1 capsule (10 mg total) by mouth every 8 (eight) hours as needed for spasms.   diphenoxylate-atropine 2.5-0.025 MG tablet Commonly known as: Lomotil Take 1 tablet by mouth 2 (two) times daily as needed for diarrhea or loose stools.   divalproex 250 MG 24 hr tablet Commonly known as: DEPAKOTE ER TAKE 2 TABLETS(500 MG) BY MOUTH AT BEDTIME What changed: See the new instructions.   donepezil 10 MG tablet Commonly known as: ARICEPT Take 1 tablet at Bedtime   doxepin 10 MG capsule Commonly known as: SINEQUAN Take 1 capsule (10 mg total) by mouth at bedtime as needed.   gabapentin 400 MG capsule Commonly known as: NEURONTIN Take 400 mg by mouth 3 (three)  times daily.   glucose blood test strip Commonly known as: Contour Next Test Use to test blood sugar 2 times daily   Lantus SoloStar 100 UNIT/ML Solostar Pen Generic drug: Insulin Glargine Inject 48 units under the skin once daily. What changed:   how much to take  additional instructions   levothyroxine 75 MCG tablet Commonly known as: SYNTHROID TAKE  1 TABLET BY MOUTH DAILY BEFORE BREAKFAST What changed: See the new instructions.   lithium carbonate 450 MG CR tablet Commonly known as: ESKALITH TAKE 1 TABLET(450 MG) BY MOUTH AT BEDTIME   losartan 50 MG tablet Commonly known as: COZAAR Take 25 mg by mouth daily.   Melatonin 3 MG Tabs Take 9 mg by mouth at bedtime.   memantine 10 MG tablet Commonly known as: NAMENDA TAKE 1 TABLET(10 MG) BY MOUTH EVERY EVENING   Mesalamine 800 MG Tbec Take 2 tablets (1,600 mg total) by mouth 3 (three) times daily for 3 days.   Microlet Lancets Misc USE DAILY AS DIRECTED   nitroGLYCERIN 0.4 MG SL tablet Commonly known as: NITROSTAT Place 1 tablet (0.4 mg total) under the tongue every 5 (five) minutes as needed for chest pain.   ondansetron 4 MG disintegrating tablet Commonly known as: Zofran ODT 4mg  ODT q4 hours prn nausea/vomit   ondansetron 8 MG tablet Commonly known as: Zofran Take 1 tablet (8 mg total) by mouth every 8 (eight) hours as needed for nausea.   oxyCODONE-acetaminophen 5-325 MG tablet Commonly known as: PERCOCET/ROXICET Take 1 tablet by mouth every 6 (six) hours as needed for severe pain.   Ozempic (1 MG/DOSE) 2 MG/1.5ML Sopn Generic drug: Semaglutide (1 MG/DOSE) INJECT 1MG  INTO THE SKIN ONCE A WEEK AS DIRECTED   polyethylene glycol 17 g packet Commonly known as: MiraLax Take 17 g by mouth daily as needed.   QUEtiapine 100 MG tablet Commonly known as: SEROQUEL TAKE 1 AND 1/2 TABLETS BY MOUTH EVERY MORNING AND 2 TABLETS EVERY NIGHT AT BEDTIME What changed:   how much to take  how to take this  when  to take this  additional instructions   rOPINIRole 0.5 MG tablet Commonly known as: REQUIP Take 3 tablets (1.5 mg total) by mouth 3 (three) times daily.   traMADol 50 MG tablet Commonly known as: ULTRAM Take 1 tablet (50 mg total) by mouth every 6 (six) hours as needed for up to 20 doses.   zaleplon 5 MG capsule Commonly known as: SONATA TAKE 1 CAPSULE(5 MG) BY MOUTH AT BEDTIME       Allergies:  Allergies  Allergen Reactions  . Codeine Anxiety and Other (See Comments)    Reaction:  Hallucinations  . Macrolides And Ketolides Nausea And Vomiting  . Procaine Hcl Palpitations  . Aspirin Nausea And Vomiting and Other (See Comments)    Reaction:  Burns pts stomach   . Benadryl [Diphenhydramine] Other (See Comments)    Per MD "inhibits potency of gabapentin, lithium etc"  . Diflucan [Fluconazole] Other (See Comments)    Reaction:  Unknown   . Dilaudid [Hydromorphone Hcl] Other (See Comments)    Reaction:  Migraines and nightmares   . Other Nausea And Vomiting and Other (See Comments)    Pt states that all -mycins cause N/V.   Marland Kitchen Sulfa Antibiotics Other (See Comments)    Reaction:  Unknown     Past Medical History:  Diagnosis Date  . Alcohol abuse   . Anxiety    takes Valium daily as needed and Ativan daily  . Bilateral hearing loss   . Bipolar 1 disorder ([place])    takes Lithium nightly and Synthroid daily  . Chronic back pain    DDD; "all over" (09/14/2017)  . Colitis, ischemic ([place]) 2012  . Confusion    r/t meds  . Depression    takes Prozac daily and Bupspirone   . Diverticulosis   .  Dyslipidemia    takes Crestor daily  . Fibromyalgia   . Headache    "weekly" (09/14/2017)  . Hepatic steatosis 06/18/12   severe  . History of kidney stones   . Hyperlipidemia   . Hypertension   . Hypothyroidism   . IBS (irritable bowel syndrome)   . Ischemic colitis ([place])   . Joint pain   . Joint swelling   . Lupus erythematosus tumidus    tumid-skin  . Migraine    "1-2/yr;  maybe" (09/14/2017)  . Numbness    in right foot  . Osteoarthritis    "all over" (09/14/2017)  . Osteoarthritis cervical spine   . Osteoarthritis of hand    bilateral  . Pneumonia    "walking pneumonia several times; long time since the last time" (09/14/2017)  . Restless leg syndrome    takes Requip nightly  . Sciatica   . Type II diabetes mellitus ([place])   . Urinary frequency   . Urinary leakage   . Urinary urgency   . Urinary, incontinence, stress female   . Walking pneumonia    last time more than 13yrs ago    Past Surgical History:  Procedure Laterality Date  . ABDOMINAL HYSTERECTOMY     "they left my ovaries"  . APPENDECTOMY    . CARDIAC CATHETERIZATION  09/14/2017  . COLONOSCOPY    . DENTAL SURGERY Left 10/2016   dental implant  . DILATION AND CURETTAGE OF UTERUS  X 4  . ESOPHAGOGASTRODUODENOSCOPY    . FLEXIBLE SIGMOIDOSCOPY N/A 06/21/2012   Procedure: FLEXIBLE SIGMOIDOSCOPY;  Surgeon: Jerene Bears, MD;  Location: [facility];  Service: Gastroenterology;  Laterality: N/A;  . JOINT REPLACEMENT    . LEFT HEART CATH AND CORONARY ANGIOGRAPHY N/A 09/14/2017   Procedure: LEFT HEART CATH AND CORONARY ANGIOGRAPHY;  Surgeon: Belva Crome, MD;  Location: [facility];  Service: Cardiovascular;  Laterality: N/A;  . SHOULDER ARTHROSCOPY Right    "shaved spurs off rotator cuff"  . TONSILLECTOMY    . TOTAL HIP ARTHROPLASTY Right 06/09/2013   Procedure: TOTAL HIP ARTHROPLASTY;  Surgeon: Kerin Salen, MD;  Location: [facility];  Service: Orthopedics;  Laterality: Right;  . TUBAL LIGATION    . TUMOR EXCISION Right 1968   angle of jaw; benign    Family History  Problem Relation Age of Onset  . Drug abuse Mother   . Alcohol abuse Mother   . Alcohol abuse Father   . Hypertension Father   . CAD Brother   . Hypertension Brother   . Alcohol abuse Brother   . Hypertension Brother   . Hypertension Brother   . Psoriasis Daughter   . Arthritis Daughter        psoriatic arthritis    . Alcohol abuse Grandchild     Social History:  reports that she has never smoked. She has never used smokeless tobacco. She reports previous alcohol use.  Frequency: 7.00 times per week. Drug: Benzodiazepines.   Review of Systems    Lipid history: On Lipitor 40 mg prescribed by her PCP Last LDL was high and just above 100  Triglycerides last over 200  On fish oil ? 2 daily   Lab Results  Component Value Date   CHOL 178 05/04/2018   HDL 64.40 05/04/2018   LDLDIRECT 103.0 05/04/2018   TRIG 240.0 (H) 05/04/2018   CHOLHDL 3 05/04/2018            Most recent eye exam was In  03/2018, negative  Most recent foot exam:03/2016  THYROID: She has been on levothyroxine and Cytomel from her psychiatrist for several years with uncertain diagnosis Currently taking levothyroxine 75 g, also on Cytomel 25 g Overall feeling fairly good  Also on lithium  Her TSH is fairly consistently near 1.0, most recent labs done by PCP showed TSH of 1.3 Last Free T3 and free T4 are normal  Labs as follows:  Lab Results  Component Value Date   TSH 1.35 07/04/2018   TSH 1.09 12/21/2017   TSH 3.86 03/22/2017   FREET4 0.77 07/04/2018   FREET4 0.65 12/21/2017   FREET4 1.03 03/22/2017   Lab Results  Component Value Date   T3FREE 2.5 07/04/2018   T3FREE 2.3 12/21/2017   T3FREE 3.0 03/22/2017   T3FREE 3.2 07/10/2016   T3FREE 2.7 04/30/2016    She has atypical depression on multiple drugs, has been on Seroquel for quite some time  THYROID nodule:  She was found to have a nodule in her thyroid isthmus in August but for various reasons has not gone for her ultrasound  Previously had mild hypercalcemia, now this is high again Not on HCTZ but is on lithium  Lab Results  Component Value Date   PTH 35 10/25/2018   CALCIUM 10.9 (H) 01/23/2019   CAION 1.30 07/14/2016   PHOS 2.9 12/26/2010     Physical Examination:  There were no vitals taken for this visit.  No exam done, patient seen  on virtual visit    ASSESSMENT:  Diabetes type 2, on insulin  See history of present illness for detailed discussion of current diabetes management, blood sugar patterns and problems identified  She is on Ozempic and Lantus, no mealtime insulin currently  Blood sugars are still fairly good with A1c 6.2, previously 6.3  Meter not downloaded and only the last week fasting blood sugars are available She is still requires a higher dose of Lantus which was increased on her last visit despite not getting any more steroids As before diet is variable especially with her intake with meals and snacks at night However she appears to be losing weight with decreased appetite and some nausea   Hypothyroidism, probably secondary: TSH normal  She is on a combination of Cytomel and levothyroxine as before and will continue  HYPERCALCEMIA: Calcium is mildly increased from mild hyperparathyroidism Need to consider bone density when she is able to do this     PLAN:  She will try to do only 0.5 mg Ozempic using half the number of clicks on the pen If she has reduced nausea and anorexia she can continue this until her appetite improves No change in Lantus Emphasized the need to check readings after meals at night Also recommended trying to exercise when she can after her upcoming surgery  Thyroid ultrasound.  She will schedule this when she can   Continue to follow calcium levels  Follow-up in 3 months  There are no Patient Instructions on file for this visit.     Duration of telephone encounter =7 minutes    Elayne Snare 01/25/2019, 1:42 PM   Note: This office note was prepared with Dragon voice recognition system technology. Any transcriptional errors that result from this process are unintentional.

## 2019-01-26 ENCOUNTER — Other Ambulatory Visit: Payer: Self-pay

## 2019-01-26 ENCOUNTER — Emergency Department (HOSPITAL_COMMUNITY)
Admission: EM | Admit: 2019-01-26 | Discharge: 2019-01-26 | Disposition: A | Discharge status: Home / Self Care | Payer: Medicare Other | Attending: Emergency Medicine | Admitting: Emergency Medicine

## 2019-01-26 ENCOUNTER — Emergency Department (HOSPITAL_COMMUNITY): Payer: Medicare Other

## 2019-01-26 DIAGNOSIS — R1110 Vomiting, unspecified: Secondary | ICD-10-CM | POA: Diagnosis not present

## 2019-01-26 DIAGNOSIS — R1031 Right lower quadrant pain: Secondary | ICD-10-CM | POA: Diagnosis not present

## 2019-01-26 DIAGNOSIS — R110 Nausea: Secondary | ICD-10-CM | POA: Insufficient documentation

## 2019-01-26 DIAGNOSIS — E119 Type 2 diabetes mellitus without complications: Secondary | ICD-10-CM | POA: Diagnosis not present

## 2019-01-26 DIAGNOSIS — R1030 Lower abdominal pain, unspecified: Secondary | ICD-10-CM | POA: Diagnosis not present

## 2019-01-26 DIAGNOSIS — E039 Hypothyroidism, unspecified: Secondary | ICD-10-CM | POA: Diagnosis not present

## 2019-01-26 DIAGNOSIS — I959 Hypotension, unspecified: Secondary | ICD-10-CM | POA: Diagnosis not present

## 2019-01-26 DIAGNOSIS — I10 Essential (primary) hypertension: Secondary | ICD-10-CM | POA: Diagnosis not present

## 2019-01-26 DIAGNOSIS — R197 Diarrhea, unspecified: Secondary | ICD-10-CM | POA: Insufficient documentation

## 2019-01-26 DIAGNOSIS — Z794 Long term (current) use of insulin: Secondary | ICD-10-CM | POA: Insufficient documentation

## 2019-01-26 DIAGNOSIS — R0902 Hypoxemia: Secondary | ICD-10-CM | POA: Diagnosis not present

## 2019-01-26 DIAGNOSIS — Z79899 Other long term (current) drug therapy: Secondary | ICD-10-CM | POA: Insufficient documentation

## 2019-01-26 DIAGNOSIS — R109 Unspecified abdominal pain: Secondary | ICD-10-CM

## 2019-01-26 DIAGNOSIS — Z20828 Contact with and (suspected) exposure to other viral communicable diseases: Secondary | ICD-10-CM | POA: Diagnosis not present

## 2019-01-26 DIAGNOSIS — R5381 Other malaise: Secondary | ICD-10-CM | POA: Diagnosis not present

## 2019-01-26 DIAGNOSIS — R1032 Left lower quadrant pain: Secondary | ICD-10-CM | POA: Diagnosis not present

## 2019-01-26 LAB — CBC WITH DIFFERENTIAL/PLATELET
Abs Immature Granulocytes: 0.04 10*3/uL (ref 0.00–0.07)
Basophils Absolute: 0 10*3/uL (ref 0.0–0.1)
Basophils Relative: 0 %
Eosinophils Absolute: 0.1 10*3/uL (ref 0.0–0.5)
Eosinophils Relative: 1 %
HCT: 46.3 % — ABNORMAL HIGH (ref 36.0–46.0)
Hemoglobin: 14.3 g/dL (ref 12.0–15.0)
Immature Granulocytes: 1 %
Lymphocytes Relative: 22 %
Lymphs Abs: 1.8 10*3/uL (ref 0.7–4.0)
MCH: 29 pg (ref 26.0–34.0)
MCHC: 30.9 g/dL (ref 30.0–36.0)
MCV: 93.9 fL (ref 80.0–100.0)
Monocytes Absolute: 0.4 10*3/uL (ref 0.1–1.0)
Monocytes Relative: 4 %
Neutro Abs: 6 10*3/uL (ref 1.7–7.7)
Neutrophils Relative %: 72 %
Platelets: 231 10*3/uL (ref 150–400)
RBC: 4.93 MIL/uL (ref 3.87–5.11)
RDW: 13.7 % (ref 11.5–15.5)
WBC: 8.3 10*3/uL (ref 4.0–10.5)
nRBC: 0 % (ref 0.0–0.2)

## 2019-01-26 LAB — URINALYSIS, ROUTINE W REFLEX MICROSCOPIC
Bilirubin Urine: NEGATIVE
Glucose, UA: NEGATIVE mg/dL
Hgb urine dipstick: NEGATIVE
Ketones, ur: NEGATIVE mg/dL
Leukocytes,Ua: NEGATIVE
Nitrite: NEGATIVE
Protein, ur: NEGATIVE mg/dL
Specific Gravity, Urine: 1.008 (ref 1.005–1.030)
pH: 6 (ref 5.0–8.0)

## 2019-01-26 LAB — COMPREHENSIVE METABOLIC PANEL
ALT: 19 U/L (ref 0–44)
AST: 20 U/L (ref 15–41)
Albumin: 4.2 g/dL (ref 3.5–5.0)
Alkaline Phosphatase: 80 U/L (ref 38–126)
Anion gap: 10 (ref 5–15)
BUN: 11 mg/dL (ref 8–23)
CO2: 22 mmol/L (ref 22–32)
Calcium: 9.9 mg/dL (ref 8.9–10.3)
Chloride: 107 mmol/L (ref 98–111)
Creatinine, Ser: 0.73 mg/dL (ref 0.44–1.00)
GFR calc Af Amer: >60 mL/min (ref >60)
GFR calc non Af Amer: >60 mL/min (ref >60)
Glucose, Bld: 112 mg/dL — ABNORMAL HIGH (ref 70–99)
Potassium: 4.2 mmol/L (ref 3.5–5.1)
Sodium: 139 mmol/L (ref 135–145)
Total Bilirubin: 0.8 mg/dL (ref 0.3–1.2)
Total Protein: 7.3 g/dL (ref 6.5–8.1)

## 2019-01-26 LAB — SARS CORONAVIRUS 2 (TAT 6-24 HRS): SARS Coronavirus 2: NEGATIVE

## 2019-01-26 MED ORDER — IOHEXOL 300 MG/ML  SOLN
100.0000 mL | Freq: Once | INTRAMUSCULAR | Status: AC | PRN
  Administered 2019-01-26: 100 mL via INTRAVENOUS

## 2019-01-26 MED ORDER — SODIUM CHLORIDE (PF) 0.9 % IJ SOLN
INTRAMUSCULAR | Status: AC
  Filled 2019-01-26: qty 50

## 2019-01-26 MED ORDER — FENTANYL CITRATE (PF) 100 MCG/2ML IJ SOLN
50.0000 ug | Freq: Once | INTRAMUSCULAR | Status: AC
  Administered 2019-01-26: 50 ug via INTRAVENOUS
  Filled 2019-01-26: qty 2

## 2019-01-26 MED ORDER — ONDANSETRON 8 MG PO TBDP
8.0000 mg | ORAL_TABLET | Freq: Once | ORAL | Status: AC
  Administered 2019-01-26: 8 mg via ORAL
  Filled 2019-01-26: qty 1

## 2019-01-26 MED ORDER — SODIUM CHLORIDE 0.9 % IV BOLUS
500.0000 mL | Freq: Once | INTRAVENOUS | Status: AC
  Administered 2019-01-26: 500 mL via INTRAVENOUS

## 2019-01-26 NOTE — ED Notes (Signed)
Eulis Foster Md at bedside

## 2019-01-26 NOTE — Discharge Instructions (Addendum)
Continue taking her usual prescribed medication.  You can use Tylenol every 4 hours for pain.  Try using heating pad on the sore area to help the discomfort.  Follow-up with your PCP clinic GI doctors as needed for problems.

## 2019-01-26 NOTE — ED Triage Notes (Signed)
Transported by [facility] from home-- onset of n/v/d x 24 hours. AAO x 4. VSS. Seen 3 days ago at [facility] for same. Hx of colitis.

## 2019-01-26 NOTE — ED Notes (Signed)
Patient requesting pain medication. MD made aware. 

## 2019-01-26 NOTE — ED Provider Notes (Signed)
[facility] DEPT Provider Note   CSN: [id number] Arrival date & time: 01/26/19  1043     History   Chief Complaint Chief Complaint  Patient presents with   Emesis    HPI [name] [name] is a 73 y.o. female.     HPI   She presents for evaluation of persistent and recurrent lower abdominal pain, present now for several days.  She has associated diarrhea without bleeding.  She has had nausea without vomiting.  She talked to her PCP today who suggested she come here for further evaluation and treatment.  She went to the ED at [facility], 3 days ago and left without being seen.  She did have labs done at that time.  She was last treated for possible diverticulitis, from an ED visit, 7 weeks ago.  At that time she got Cipro Flagyl for 1 week.  No prior operative interventions for colitis.  There are no other known modifying factors.  Past Medical History:  Diagnosis Date   Alcohol abuse    Anxiety    takes Valium daily as needed and Ativan daily   Bilateral hearing loss    Bipolar 1 disorder ([place])    takes Lithium nightly and Synthroid daily   Chronic back pain    DDD; "all over" (09/14/2017)   Colitis, ischemic ([place]) 2012   Confusion    r/t meds   Depression    takes Prozac daily and Bupspirone    Diverticulosis    Dyslipidemia    takes Crestor daily   Fibromyalgia    Headache    "weekly" (09/14/2017)   Hepatic steatosis 06/18/12   severe   History of kidney stones    Hyperlipidemia    Hypertension    Hypothyroidism    IBS (irritable bowel syndrome)    Ischemic colitis ([place])    Joint pain    Joint swelling    Lupus erythematosus tumidus    tumid-skin   Migraine    "1-2/yr; maybe" (09/14/2017)   Numbness    in right foot   Osteoarthritis    "all over" (09/14/2017)   Osteoarthritis cervical spine    Osteoarthritis of hand    bilateral   Pneumonia    "walking pneumonia several times; long time since the  last time" (09/14/2017)   Restless leg syndrome    takes Requip nightly   Sciatica    Type II diabetes mellitus ([name])    Urinary frequency    Urinary leakage    Urinary urgency    Urinary, incontinence, stress female    Walking pneumonia    last time more than 37yrs ago    Patient Active Problem List   Diagnosis Date Noted   Chest pain    Chest pain with normal coronary angiography    Diverticulitis 11/19/2016   Hypothyroidism 07/14/2016   Diarrhea    Generalized abdominal pain    Major depressive disorder, recurrent severe without psychotic features ([place]) 12/31/2015   Nausea without vomiting    Colitis 10/28/2015   Unintentional poisoning by psychotropic drug 07/05/2015   Restless leg syndrome 07/05/2015   Diabetic polyneuropathy associated with type 2 diabetes mellitus ([employer]) 09/21/2014   Arthritis of left hip 06/09/2013   Arthritis of right hip 06/08/2013   Hip pain, right 05/02/2013   IBS (irritable bowel syndrome) 01/02/2013   Unspecified vitamin D deficiency 07/05/2012   Pure hyperglyceridemia 07/04/2012   Diabetes mellitus with diabetic neuropathy, without long-term current use of insulin ([place])  06/18/2012   Hypertension 06/18/2012   Bipolar 1 disorder ([place]) 06/18/2012   Low back pain 02/25/2011   Acute ischemic colitis ([place]) 01/06/2011   Sciatica 12/19/2010   Fibromyalgia 10/28/2010   Primary osteoarthritis of right hip 10/24/2010   Hyperlipidemia with target low density lipoprotein (LDL) cholesterol less than 100 mg/dL 05/05/2010   DEPRESSION/ANXIETY 05/05/2010   ABUSE, ALCOHOL, IN REMISSION 05/05/2010   OTITIS MEDIA, CHRONIC 05/05/2010   Hearing loss 05/05/2010   ALLERGIC RHINITIS, SEASONAL, MILD 05/05/2010   URINARY INCONTINENCE, STRESS, FEMALE 05/05/2010   OSTEOARTHRITIS, HANDS, BILATERAL 05/05/2010   OSTEOARTHRITIS, CERVICAL SPINE 05/05/2010    Past Surgical History:  Procedure Laterality Date   ABDOMINAL  HYSTERECTOMY     "they left my ovaries"   APPENDECTOMY     CARDIAC CATHETERIZATION  09/14/2017   COLONOSCOPY     DENTAL SURGERY Left 10/2016   dental implant   DILATION AND CURETTAGE OF UTERUS  X 4   ESOPHAGOGASTRODUODENOSCOPY     FLEXIBLE SIGMOIDOSCOPY N/A 06/21/2012   Procedure: FLEXIBLE SIGMOIDOSCOPY;  Surgeon: Jerene Bears, MD;  Location: [facility];  Service: Gastroenterology;  Laterality: N/A;   JOINT REPLACEMENT     LEFT HEART CATH AND CORONARY ANGIOGRAPHY N/A 09/14/2017   Procedure: LEFT HEART CATH AND CORONARY ANGIOGRAPHY;  Surgeon: Belva Crome, MD;  Location: [facility];  Service: Cardiovascular;  Laterality: N/A;   SHOULDER ARTHROSCOPY Right    "shaved spurs off rotator cuff"   TONSILLECTOMY     TOTAL HIP ARTHROPLASTY Right 06/09/2013   Procedure: TOTAL HIP ARTHROPLASTY;  Surgeon: Kerin Salen, MD;  Location: [facility];  Service: Orthopedics;  Laterality: Right;   TUBAL LIGATION     TUMOR EXCISION Right 1968   angle of jaw; benign     OB History   No obstetric history on file.      Home Medications    Prior to Admission medications   Medication Sig Start Date End Date Taking? Authorizing Provider  ALPRAZolam Duanne Moron) 0.5 MG tablet Take 1 tablet (0.5 mg total) by mouth 4 (four) times daily as needed for anxiety. 11/04/18   [name], PMHNP  atorvastatin (LIPITOR) 40 MG tablet Take 40 mg by mouth daily at 6 PM.     [provider]  BD PEN NEEDLE NANO U/F 32G X 4 MM MISC USE TO INJECT 5 TIMES DAILY AS DIRECTED 02/07/18   Elayne Snare, MD  cholecalciferol (VITAMIN D) 1000 units tablet Take 2,000 Units by mouth daily.     [provider]  dicyclomine (BENTYL) 10 MG capsule Take 1 capsule (10 mg total) by mouth every 8 (eight) hours as needed for spasms. 06/30/18   Doran Stabler, MD  diphenoxylate-atropine (LOMOTIL) 2.5-0.025 MG tablet Take 1 tablet by mouth 2 (two) times daily as needed for diarrhea or loose stools. 05/31/18    Doran Stabler, MD  divalproex (DEPAKOTE ER) 250 MG 24 hr tablet TAKE 2 TABLETS(500 MG) BY MOUTH AT BEDTIME Patient taking differently: Take 500 mg by mouth at bedtime.  09/10/18   [name], PMHNP  donepezil (ARICEPT) 10 MG tablet Take 1 tablet at Bedtime 12/27/18   [name], PMHNP  doxepin (SINEQUAN) 10 MG capsule Take 1 capsule (10 mg total) by mouth at bedtime as needed. 12/23/18   [name], PMHNP  gabapentin (NEURONTIN) 400 MG capsule Take 400 mg by mouth 3 (three) times daily.     [provider]  glucose blood (CONTOUR NEXT TEST)  test strip Use to test blood sugar 2 times daily 04/28/18   Elayne Snare, MD  Insulin Glargine (LANTUS SOLOSTAR) 100 UNIT/ML Solostar Pen Inject 48 units under the skin once daily. Patient taking differently: 54 Units. Inject 54 units under the skin once daily. 11/18/18   Elayne Snare, MD  levothyroxine (SYNTHROID) 75 MCG tablet TAKE 1 TABLET BY MOUTH DAILY BEFORE BREAKFAST Patient taking differently: Take 75 mcg by mouth daily before breakfast.  10/31/18   Elayne Snare, MD  lithium carbonate (ESKALITH) 450 MG CR tablet TAKE 1 TABLET(450 MG) BY MOUTH AT BEDTIME 12/05/18   [name], PMHNP  losartan (COZAAR) 50 MG tablet Take 25 mg by mouth daily.     [provider]  Melatonin 3 MG TABS Take 9 mg by mouth at bedtime.    [provider]  memantine (NAMENDA) 10 MG tablet TAKE 1 TABLET(10 MG) BY MOUTH EVERY EVENING 12/23/18   [name], PMHNP  Mesalamine 800 MG TBEC Take 2 tablets (1,600 mg total) by mouth 3 (three) times daily for 3 days. 06/30/18 07/03/18  Doran Stabler, MD  Microlet Lancets MISC USE DAILY AS DIRECTED 10/24/18   Elayne Snare, MD  nitroGLYCERIN (NITROSTAT) 0.4 MG SL tablet Place 1 tablet (0.4 mg total) under the tongue every 5 (five) minutes as needed for chest pain. 09/14/17   Lyda Jester M, PA-C  ondansetron (ZOFRAN ODT) 4 MG disintegrating tablet 4mg  ODT q4 hours prn nausea/vomit 05/19/18    Milton Ferguson, MD  ondansetron (ZOFRAN) 8 MG tablet Take 1 tablet (8 mg total) by mouth every 8 (eight) hours as needed for nausea. 01/01/18   Orlie Dakin, MD  oxyCODONE-acetaminophen (PERCOCET/ROXICET) 5-325 MG tablet Take 1 tablet by mouth every 6 (six) hours as needed for severe pain. 12/13/18   Antonietta Breach, PA-C  OZEMPIC, 1 MG/DOSE, 2 MG/1.5ML SOPN INJECT 1MG  INTO THE SKIN ONCE A WEEK AS DIRECTED 11/23/18   Elayne Snare, MD  polyethylene glycol [facility]) packet Take 17 g by mouth daily as needed. 11/20/16   Lavina Hamman, MD  QUEtiapine (SEROQUEL) 100 MG tablet TAKE 1 AND 1/2 TABLETS BY MOUTH EVERY MORNING AND 2 TABLETS EVERY NIGHT AT BEDTIME Patient taking differently: Take 200 mg by mouth 2 (two) times daily.  11/04/18   [name], PMHNP  rOPINIRole (REQUIP) 0.5 MG tablet Take 3 tablets (1.5 mg total) by mouth 3 (three) times daily. 11/25/18   [name], PMHNP  traMADol (ULTRAM) 50 MG tablet Take 1 tablet (50 mg total) by mouth every 6 (six) hours as needed for up to 20 doses. 06/06/18   Doran Stabler, MD  zaleplon (SONATA) 5 MG capsule TAKE 1 CAPSULE(5 MG) BY MOUTH AT BEDTIME 12/27/18   [name], PMHNP    Family History Family History  Problem Relation Age of Onset   Drug abuse Mother    Alcohol abuse Mother    Alcohol abuse Father    Hypertension Father    CAD Brother    Hypertension Brother    Alcohol abuse Brother    Hypertension Brother    Hypertension Brother    Psoriasis Daughter    Arthritis Daughter        psoriatic arthritis    Alcohol abuse Grandchild     Social History Social History   Tobacco Use   Smoking status: Never Smoker   Smokeless tobacco: Never Used  Substance Use Topics   Alcohol use: Not Currently    Comment: 09/14/2017 "nothing since  04/15/2008"   Drug use: Not on file     Allergies   Codeine, Macrolides and ketolides, Procaine hcl, Aspirin, Benadryl [diphenhydramine], Diflucan [fluconazole], Dilaudid  [hydromorphone hcl], Other, and Sulfa antibiotics   Review of Systems Review of Systems  All other systems reviewed and are negative.    Physical Exam Updated Vital Signs BP 120/67    Pulse 68    Temp 98.5 F (36.9 C) (Oral)    Resp 11    Ht 5\' 2"  (1.575 m)    Wt 72.6 kg    SpO2 97%    BMI 29.26 kg/m   Physical Exam Vitals signs and nursing note reviewed.  Constitutional:      General: She is not in acute distress.    Appearance: She is well-developed. She is obese. She is not ill-appearing, toxic-appearing or diaphoretic.  HENT:     Head: Normocephalic and atraumatic.     Right Ear: External ear normal.     Left Ear: External ear normal.  Eyes:     Conjunctiva/sclera: Conjunctivae normal.     Pupils: Pupils are equal, round, and reactive to light.  Neck:     Musculoskeletal: Normal range of motion and neck supple.     Trachea: Phonation normal.  Cardiovascular:     Rate and Rhythm: Normal rate and regular rhythm.     Heart sounds: Normal heart sounds.  Pulmonary:     Effort: Pulmonary effort is normal.     Breath sounds: Normal breath sounds.  Abdominal:     General: There is no distension.     Palpations: Abdomen is soft.     Tenderness: There is abdominal tenderness (Right and left lower quadrants, mild). There is no guarding or rebound.  Musculoskeletal: Normal range of motion.     Comments: Normal gait  Skin:    General: Skin is warm and dry.  Neurological:     Mental Status: She is alert and oriented to person, place, and time.     Cranial Nerves: No cranial nerve deficit.     Sensory: No sensory deficit.     Motor: No abnormal muscle tone.     Coordination: Coordination normal.  Psychiatric:        Mood and Affect: Mood normal.        Behavior: Behavior normal.        Thought Content: Thought content normal.        Judgment: Judgment normal.      ED Treatments / Results  Labs (all labs ordered are listed, but only abnormal results are  displayed) Labs Reviewed  COMPREHENSIVE METABOLIC PANEL - Abnormal; Notable for the following components:      Result Value   Glucose, Bld 112 (*)    All other components within normal limits  CBC WITH DIFFERENTIAL/PLATELET - Abnormal; Notable for the following components:   HCT 46.3 (*)    All other components within normal limits  URINALYSIS, ROUTINE W REFLEX MICROSCOPIC - Abnormal; Notable for the following components:   Color, Urine AMBER (*)    All other components within normal limits  SARS CORONAVIRUS 2 (TAT 6-24 HRS)    EKG None  Radiology Ct Abdomen Pelvis W Contrast  Result Date: 01/26/2019 CLINICAL DATA:  Nausea, vomiting, and diarrhea. EXAM: CT ABDOMEN AND PELVIS WITH CONTRAST TECHNIQUE: Multidetector CT imaging of the abdomen and pelvis was performed using the standard protocol following bolus administration of intravenous contrast. CONTRAST:  174mL OMNIPAQUE IOHEXOL 300 MG/ML  SOLN COMPARISON:  CT abdomen pelvis dated October 21, 2018. FINDINGS: Lower chest: No acute abnormality. Unchanged 5 mm pulmonary nodule in the left lower lobe, benign. Hepatobiliary: No focal liver abnormality is seen. No gallstones, gallbladder wall thickening, or biliary dilatation. Pancreas: Unchanged mildly prominent main pancreatic duct. No surrounding inflammatory changes. Spleen: Normal in size without focal abnormality. Adrenals/Urinary Tract: Adrenal glands are unremarkable. Unchanged right renal atrophy and mild right hydronephrosis to the level of the UPJ. No renal mass or calculi. The bladder is unremarkable. Stomach/Bowel: Stomach is within normal limits. Appendix is surgically absent. No evidence of bowel wall thickening, distention, or inflammatory changes. Mild sigmoid diverticulosis. Vascular/Lymphatic: No significant vascular findings are present. No enlarged abdominal or pelvic lymph nodes. Reproductive: Status post hysterectomy. No adnexal masses. Other: No free fluid or pneumoperitoneum.  Unchanged small fat containing umbilical hernia. Musculoskeletal: No acute or significant osseous findings. Prior right total hip arthroplasty. IMPRESSION: 1.  No acute intra-abdominal process. 2. Unchanged mild right hydronephrosis to the level of the UPJ. Electronically Signed   By: Titus Dubin M.D.   On: 01/26/2019 14:05    Procedures Procedures (including critical care time)  Medications Ordered in ED Medications  sodium chloride (PF) 0.9 % injection (has no administration in time range)  sodium chloride 0.9 % bolus 500 mL (0 mLs Intravenous Stopped 01/26/19 1221)  fentaNYL (SUBLIMAZE) injection 50 mcg (50 mcg Intravenous Given 01/26/19 1259)  ondansetron (ZOFRAN-ODT) disintegrating tablet 8 mg (8 mg Oral Given 01/26/19 1259)  iohexol (OMNIPAQUE) 300 MG/ML solution 100 mL (100 mLs Intravenous Contrast Given 01/26/19 1337)     Initial Impression / Assessment and Plan / ED Course  I have reviewed the triage vital signs and the nursing notes.  Pertinent labs & imaging results that were available during my care of the patient were reviewed by me and considered in my medical decision making (see chart for details).  Clinical Course as of Jan 25 1510  Thu Jan 26, 2019  1504 Normal   [EW]  1508 Normal except slight elevation glucose  Comprehensive metabolic panel(!) [EW]  123456 Normal  CBC with Differential(!) [EW]    Clinical Course User Index [EW] Daleen Bo, MD        Patient Vitals for the past 24 hrs:  BP Temp Temp src Pulse Resp SpO2 Height Weight  01/26/19 1330 120/67 -- -- 68 11 97 % -- --  01/26/19 1300 133/65 -- -- 78 10 97 % -- --  01/26/19 1230 123/61 -- -- 80 19 96 % -- --  01/26/19 1130 131/67 -- -- 88 19 96 % -- --  01/26/19 1100 133/64 -- -- 82 20 95 % -- --  01/26/19 1057 (!) 149/78 98.5 F (36.9 C) Oral 81 16 97 % 5\' 2"  (1.575 m) 72.6 kg    3:08 PM Reevaluation with update and discussion. After initial assessment and treatment, an updated  evaluation reveals she is comfortable has no further complaints.  Has been is here now and states that her GI doctor recently started her on mesalamine and methocarbamol.  I encouraged her to continue taking these.  All questions answered. Daleen Bo   Medical Decision Making: Recurrent abdominal pain with reassuring findings.  Nonspecific intestinal cramping is suspected.  No indication for antibiotic treatment, hospitalization or further ED intervention.  CRITICAL CARE-no Performed by: Daleen Bo  Nursing Notes Reviewed/ Care Coordinated Applicable Imaging Reviewed Interpretation of Laboratory Data incorporated into ED treatment  The patient appears reasonably screened and/or stabilized for discharge  and I doubt any other medical condition or other [facility] requiring further screening, evaluation, or treatment in the ED at this time prior to discharge.  Plan: Home Medications-continue usual, try Tylenol for pain.; Home Treatments-heat to affected area; return here if the recommended treatment, does not improve the symptoms; Recommended follow up-PCP, prn   Final Clinical Impressions(s) / ED Diagnoses   Final diagnoses:  Abdominal pain, unspecified abdominal location    ED Discharge Orders    None       Daleen Bo, MD 01/26/19 1511

## 2019-01-26 NOTE — ED Notes (Signed)
Patient transported to CT 

## 2019-01-26 NOTE — ED Notes (Signed)
Patient ambulatory with standby assistance to restroom.

## 2019-01-27 ENCOUNTER — Telehealth: Payer: Self-pay | Admitting: Psychiatry

## 2019-01-27 NOTE — Telephone Encounter (Signed)
Pt wants to see if she can get an adjustment on her meds. She has not slept in 3 days and says she is a wreck. She has an appt on Monday, but can't wait that long.

## 2019-01-28 ENCOUNTER — Other Ambulatory Visit: Payer: Self-pay | Admitting: Endocrinology

## 2019-01-30 ENCOUNTER — Telehealth: Payer: Self-pay | Admitting: Psychiatry

## 2019-01-30 ENCOUNTER — Ambulatory Visit: Payer: Medicare Other | Admitting: Psychiatry

## 2019-01-30 NOTE — Telephone Encounter (Signed)
Patient called on Friday she is having knee surgery on December 1 st and needs to know what medicines she can take . She is not resting and is very anxious. Please give her a call. She was suppose to be seen today but we had to cancel her appointment.today. She will reschedule after her surgery. [phone]

## 2019-01-30 NOTE — Telephone Encounter (Signed)
See previous phone message. 

## 2019-01-30 NOTE — Telephone Encounter (Signed)
Pt. Made aware and verbalized understanding.

## 2019-01-31 ENCOUNTER — Other Ambulatory Visit: Payer: Self-pay | Admitting: Endocrinology

## 2019-01-31 NOTE — Patient Instructions (Addendum)
DUE TO COVID-19 ONLY ONE VISITOR IS ALLOWED TO COME WITH YOU AND STAY IN THE WAITING ROOM ONLY DURING PRE OP AND PROCEDURE DAY OF SURGERY. THE 1 VISITOR MAY VISIT WITH YOU AFTER SURGERY IN YOUR PRIVATE ROOM DURING VISITING HOURS ONLY!  YOU NEED TO HAVE A COVID 19 TEST ON  11/27_ @_1 :15 pm______, THIS TEST MUST BE DONE BEFORE SURGERY, COME  [address] , [id number].  ([place]) ONCE YOUR COVID TEST IS COMPLETED, PLEASE BEGIN THE QUARANTINE INSTRUCTIONS AS OUTLINED IN YOUR HANDOUT.                [name] [name]    Your procedure is scheduled on: 02/07/19   Report to [facility] Main  Entrance   Report to admitting at   7:35 AM     Call this number if you have problems the morning of surgery [address], NO CHEWING GUM [place].    Do not eat food After Midnight.   YOU MAY HAVE CLEAR LIQUIDS FROM MIDNIGHT UNTIL 7:00 AM  . At 7:00 AM Please finish the prescribed Pre-Surgery Gatorade drink.   Nothing by mouth after you finish the Gatorade drink !   Take these medicines the morning of surgery with A SIP OF WATER: Xanax, Gabapentin, Seroquel, Levothyroxine, Bentyl  DO NOT TAKE ANY DIABETIC MEDICATIONS DAY OF YOUR SURGERY    How to Manage Your Diabetes Before and After Surgery  Why is it important to control my blood sugar before and after surgery? . Improving blood sugar levels before and after surgery helps healing and can limit problems. . A way of improving blood sugar control is eating a healthy diet by: o  Eating less sugar and carbohydrates o  Increasing activity/exercise o  Talking with your doctor about reaching your blood sugar goals . High blood sugars (greater than 180 mg/dL) can raise your risk of infections and slow your recovery, so you will need to focus on controlling your diabetes during the weeks before surgery. . Make sure that the doctor who takes  care of your diabetes knows about your planned surgery including the date and location.  How do I manage my blood sugar before surgery? . Check your blood sugar at least 4 times a day, starting 2 days before surgery, to make sure that the level is not too high or low. o Check your blood sugar the morning of your surgery when you wake up and every 2 hours until you get to the Short Stay unit. . If your blood sugar is less than 70 mg/dL, you will need to treat for low blood sugar: o Do not take insulin. o Treat a low blood sugar (less than 70 mg/dL) with  cup of clear juice (cranberry or apple), 4 glucose tablets, OR glucose gel. o Recheck blood sugar in 15 minutes after treatment (to make sure it is greater than 70 mg/dL). If your blood sugar is not greater than 70 mg/dL on recheck, call [phone] for further instructions. . Report your blood sugar to the short stay nurse when you get to Short Stay.  . If you are admitted to the hospital after surgery: o Your blood sugar will be checked by the staff and you will probably be given insulin after surgery (instead of oral diabetes medicines) to make sure you have good blood sugar levels. o The goal for blood sugar control  after surgery is 80-180 mg/dL.   WHAT DO I DO ABOUT MY DIABETES MEDICATION?  Marland Kitchen Do not take oral diabetes medicines (pills) the morning of surgery.  . THE NIGHT BEFORE SURGERY, take 26    units of   Glargine      insulin. (50% of night dose)      . THE MORNING OF SURGERY, take  0 units of   insulin.  . The day of surgery, do not take other diabetes injectables, including Byetta (exenatide), Bydureon (exenatide ER), Victoza (liraglutide), or Trulicity (dulaglutide).  . If your CBG is greater than 220 mg/dL, you may take  of your sliding scale  . (correction) dose of insulin.                                 You may not have any metal on your body including hair pins and              piercings  Do not wear jewelry,  make-up, lotions, powders or perfumes, deodorant             Do not wear nail polish on your fingernails.              Do not shave  48 hours prior to surgery.  .   Do not bring valuables to the hospital. [place].  Contacts, dentures or bridgework may not be worn into surgery.       Patients discharged the day of surgery will not be allowed to drive home.   IF YOU ARE HAVING SURGERY AND GOING HOME THE SAME DAY, YOU MUST HAVE AN ADULT TO DRIVE YOU HOME AND BE WITH YOU FOR 24 HOURS.   YOU MAY GO HOME BY TAXI OR UBER OR ORTHERWISE, BUT AN ADULT MUST ACCOMPANY YOU HOME AND STAY WITH YOU FOR 24 HOURS.  Name and phone number of your driver:  Special Instructions: N/A              Please read over the following fact sheets you were given: _____________________________________________________________________             [facility] - Preparing for Surgery Before surgery, you can play an important role.   Because skin is not sterile, your skin needs to be as free of germs as possible.   You can reduce the number of germs on your skin by washing with CHG (chlorahexidine gluconate) soap before surgery.   CHG is an antiseptic cleaner which kills germs and bonds with the skin to continue killing germs even after washing. Please DO NOT use if you have an allergy to CHG or antibacterial soaps.   If your skin becomes reddened/irritated stop using the CHG and inform your nurse when you arrive at Short Stay. Do not shave (including legs and underarms) for at least 48 hours prior to the first CHG shower.  . Please follow these instructions carefully:  1.  Shower with CHG Soap the night before surgery and the  morning of Surgery.  2.  If you choose to wash your hair, wash your hair first as usual with your  normal  shampoo.  3.  After you shampoo, rinse your hair and body thoroughly to remove the  shampoo.  4.   Use CHG as you would any other liquid soap.  You can apply chg directly  to the skin and wash                       Gently with a scrungie or clean washcloth.  5.  Apply the CHG Soap to your body ONLY FROM THE NECK DOWN.   Do not use on face/ open                           Wound or open sores. Avoid contact with eyes, ears mouth and genitals (private parts).                       Wash face,  Genitals (private parts) with your normal soap.             6.  Wash thoroughly, paying special attention to the area where your surgery  will be performed.  7.  Thoroughly rinse your body with warm water from the neck down.  8.  DO NOT shower/wash with your normal soap after using and rinsing off  the CHG Soap.             9.  Pat yourself dry with a clean towel.            10.  Wear clean pajamas.            11.  Place clean sheets on your bed the night of your first shower and do not  sleep with pets. Day of Surgery : Do not apply any lotions/deodorants the morning of surgery.  Please wear clean clothes to the hospital/surgery center.  FAILURE TO FOLLOW THESE INSTRUCTIONS MAY RESULT IN THE CANCELLATION OF YOUR SURGERY PATIENT SIGNATURE_________________________________  NURSE SIGNATURE__________________________________  ________________________________________________________________________   Adam Phenix  An incentive spirometer is a tool that can help keep your lungs clear and active. This tool measures how well you are filling your lungs with each breath. Taking long deep breaths may help reverse or decrease the chance of developing breathing (pulmonary) problems (especially infection) following:  A long period of time when you are unable to move or be active. BEFORE THE PROCEDURE   If the spirometer includes an indicator to show your best effort, your nurse or respiratory therapist will set it to a desired goal.  If possible, sit up straight or lean slightly forward. Try not to  slouch.  Hold the incentive spirometer in an upright position. INSTRUCTIONS FOR USE  1. Sit on the edge of your bed if possible, or sit up as far as you can in bed or on a chair. 2. Hold the incentive spirometer in an upright position. 3. Breathe out normally. 4. Place the mouthpiece in your mouth and seal your lips tightly around it. 5. Breathe in slowly and as deeply as possible, raising the piston or the ball toward the top of the column. 6. Hold your breath for 3-5 seconds or for as long as possible. Allow the piston or ball to fall to the bottom of the column. 7. Remove the mouthpiece from your mouth and breathe out normally. 8. Rest for a few seconds and repeat Steps 1 through 7 at least 10 times every 1-2 hours when you are awake. Take your time and take a few normal breaths between deep breaths. 9. The spirometer may include an indicator to show your best  effort. Use the indicator as a goal to work toward during each repetition. 10. After each set of 10 deep breaths, practice coughing to be sure your lungs are clear. If you have an incision (the cut made at the time of surgery), support your incision when coughing by placing a pillow or rolled up towels firmly against it. Once you are able to get out of bed, walk around indoors and cough well. You may stop using the incentive spirometer when instructed by your caregiver.  RISKS AND COMPLICATIONS  Take your time so you do not get dizzy or light-headed.  If you are in pain, you may need to take or ask for pain medication before doing incentive spirometry. It is harder to take a deep breath if you are having pain. AFTER USE  Rest and breathe slowly and easily.  It can be helpful to keep track of a log of your progress. Your caregiver can provide you with a simple table to help with this. If you are using the spirometer at home, follow these instructions: [place] IF:   You are having difficultly using the spirometer.  You  have trouble using the spirometer as often as instructed.  Your pain medication is not giving enough relief while using the spirometer.  You develop fever of 100.5 F (38.1 C) or higher. SEEK IMMEDIATE MEDICAL CARE IF:   You cough up bloody sputum that had not been present before.  You develop fever of 102 F (38.9 C) or greater.  You develop worsening pain at or near the incision site. MAKE SURE YOU:   Understand these instructions.  Will watch your condition.  Will get help right away if you are not doing well or get worse. Document Released: 07/06/2006 Document Revised: 05/18/2011 Document Reviewed: 09/06/2006 Exit[employer] Patient Information 2014 [employer], [place].   ________________________________________________________________________  WHAT IS A BLOOD TRANSFUSION? Blood Transfusion Information  A transfusion is the replacement of blood or some of its parts. Blood is made up of multiple cells which provide different functions.  Red blood cells carry oxygen and are used for blood loss replacement.  White blood cells fight against infection.  Platelets control bleeding.  Plasma helps clot blood.  Other blood products are available for specialized needs, such as hemophilia or other clotting disorders. BEFORE THE TRANSFUSION  Who gives blood for transfusions?   Healthy volunteers who are fully evaluated to make sure their blood is safe. This is blood bank blood. Transfusion therapy is the safest it has ever been in the practice of medicine. Before blood is taken from a donor, a complete history is taken to make sure that person has no history of diseases nor engages in risky social behavior (examples are intravenous drug use or sexual activity with multiple partners). The donor's travel history is screened to minimize risk of transmitting infections, such as malaria. The donated blood is tested for signs of infectious diseases, such as HIV and hepatitis. The blood is then  tested to be sure it is compatible with you in order to minimize the chance of a transfusion reaction. If you or a relative donates blood, this is often done in anticipation of surgery and is not appropriate for emergency situations. It takes many days to process the donated blood. RISKS AND COMPLICATIONS Although transfusion therapy is very safe and saves many lives, the main dangers of transfusion include:   Getting an infectious disease.  Developing a transfusion reaction. This is an allergic reaction to something in  the blood you were given. Every precaution is taken to prevent this. The decision to have a blood transfusion has been considered carefully by your caregiver before blood is given. Blood is not given unless the benefits outweigh the risks. AFTER THE TRANSFUSION  Right after receiving a blood transfusion, you will usually feel much better and more energetic. This is especially true if your red blood cells have gotten low (anemic). The transfusion raises the level of the red blood cells which carry oxygen, and this usually causes an energy increase.  The nurse administering the transfusion will monitor you carefully for complications. HOME CARE INSTRUCTIONS  No special instructions are needed after a transfusion. You may find your energy is better. Speak with your caregiver about any limitations on activity for underlying diseases you may have. SEEK MEDICAL CARE IF:   Your condition is not improving after your transfusion.  You develop redness or irritation at the intravenous (IV) site. SEEK IMMEDIATE MEDICAL CARE IF:  Any of the following symptoms occur over the next 12 hours:  Shaking chills.  You have a temperature by mouth above 102 F (38.9 C), not controlled by medicine.  Chest, back, or muscle pain.  People around you feel you are not acting correctly or are confused.  Shortness of breath or difficulty breathing.  Dizziness and fainting.  You get a rash or  develop hives.  You have a decrease in urine output.  Your urine turns a dark color or changes to pink, red, or brown. Any of the following symptoms occur over the next 10 days:  You have a temperature by mouth above 102 F (38.9 C), not controlled by medicine.  Shortness of breath.  Weakness after normal activity.  The white part of the eye turns yellow (jaundice).  You have a decrease in the amount of urine or are urinating less often.  Your urine turns a dark color or changes to pink, red, or brown. Document Released: 02/21/2000 Document Revised: 05/18/2011 Document Reviewed: 10/10/2007 [facility] Patient Information 2014 [place], [place].  _______________________________________________________________________

## 2019-01-31 NOTE — H&P (Signed)
TOTAL KNEE ADMISSION H&P  Patient is being admitted for left total knee arthroplasty.  Subjective:  Chief Complaint:  Left knee primary OA / pain  HPI: [name] [name], 73 y.o. female, has a history of pain and functional disability in the left knee due to arthritis and has failed non-surgical conservative treatments for greater than 12 weeks to include NSAID's and/or analgesics, use of assistive devices and activity modification.  Onset of symptoms was gradual, starting 1+ years ago with gradually worsening course since that time. The patient noted prior procedures on the knee to include  arthroscopy and menisectomy on the left knee(s).  Patient currently rates pain in the left knee(s) at 8 out of 10 with activity. Patient has night pain, worsening of pain with activity and weight bearing, pain that interferes with activities of daily living, pain with passive range of motion, crepitus and joint swelling.  Patient has evidence of periarticular osteophytes and joint space narrowing by imaging studies.  There is no active infection.  Risks, benefits and expectations were discussed with the patient.  Risks including but not limited to the risk of anesthesia, blood clots, nerve damage, blood vessel damage, failure of the prosthesis, infection and up to and including death.  Patient understand the risks, benefits and expectations and wishes to proceed with surgery.   PCP: London Pepper, MD  D/C Plans:       Home   Post-op Meds:       No Rx given  Tranexamic Acid:      To be given - IV   Decadron:      Is to be given  FYI:     Xarelto (can't tolerate ASA or NSAIDs)  No Celebrex  Oxycodone  DME:   Pt already has equipment   PT:   OPPT arranged  Pharmacy: [facility]    Patient Active Problem List   Diagnosis Date Noted  . Chest pain   . Chest pain with normal coronary angiography   . Diverticulitis 11/19/2016  . Hypothyroidism 07/14/2016  . Diarrhea   . Generalized abdominal  pain   . Major depressive disorder, recurrent severe without psychotic features ([place]) 12/31/2015  . Nausea without vomiting   . Colitis 10/28/2015  . Unintentional poisoning by psychotropic drug 07/05/2015  . Restless leg syndrome 07/05/2015  . Diabetic polyneuropathy associated with type 2 diabetes mellitus ([place]) 09/21/2014  . Arthritis of left hip 06/09/2013  . Arthritis of right hip 06/08/2013  . Hip pain, right 05/02/2013  . IBS (irritable bowel syndrome) 01/02/2013  . Unspecified vitamin D deficiency 07/05/2012  . Pure hyperglyceridemia 07/04/2012  . Diabetes mellitus with diabetic neuropathy, without long-term current use of insulin ([place]) 06/18/2012  . Hypertension 06/18/2012  . Bipolar 1 disorder ([place]) 06/18/2012  . Low back pain 02/25/2011  . Acute ischemic colitis ([place]) 01/06/2011  . Sciatica 12/19/2010  . Fibromyalgia 10/28/2010  . Primary osteoarthritis of right hip 10/24/2010  . Hyperlipidemia with target low density lipoprotein (LDL) cholesterol less than 100 mg/dL 05/05/2010  . DEPRESSION/ANXIETY 05/05/2010  . ABUSE, ALCOHOL, IN REMISSION 05/05/2010  . OTITIS MEDIA, CHRONIC 05/05/2010  . Hearing loss 05/05/2010  . ALLERGIC RHINITIS, SEASONAL, MILD 05/05/2010  . URINARY INCONTINENCE, STRESS, FEMALE 05/05/2010  . OSTEOARTHRITIS, HANDS, BILATERAL 05/05/2010  . OSTEOARTHRITIS, CERVICAL SPINE 05/05/2010   Past Medical History:  Diagnosis Date  . Alcohol abuse   . Anxiety    takes Valium daily as needed and Ativan daily  . Bilateral hearing loss   .  Bipolar 1 disorder ([place])    takes Lithium nightly and Synthroid daily  . Chronic back pain    DDD; "all over" (09/14/2017)  . Colitis, ischemic ([place]) 2012  . Confusion    r/t meds  . Depression    takes Prozac daily and Bupspirone   . Diverticulosis   . Dyslipidemia    takes Crestor daily  . Fibromyalgia   . Headache    "weekly" (09/14/2017)  . Hepatic steatosis 06/18/12   severe  . History of kidney stones   .  Hyperlipidemia   . Hypertension   . Hypothyroidism   . IBS (irritable bowel syndrome)   . Ischemic colitis ([place])   . Joint pain   . Joint swelling   . Lupus erythematosus tumidus    tumid-skin  . Migraine    "1-2/yr; maybe" (09/14/2017)  . Numbness    in right foot  . Osteoarthritis    "all over" (09/14/2017)  . Osteoarthritis cervical spine   . Osteoarthritis of hand    bilateral  . Pneumonia    "walking pneumonia several times; long time since the last time" (09/14/2017)  . Restless leg syndrome    takes Requip nightly  . Sciatica   . Type II diabetes mellitus ([place])   . Urinary frequency   . Urinary leakage   . Urinary urgency   . Urinary, incontinence, stress female   . Walking pneumonia    last time more than 62yrs ago    Past Surgical History:  Procedure Laterality Date  . ABDOMINAL HYSTERECTOMY     "they left my ovaries"  . APPENDECTOMY    . CARDIAC CATHETERIZATION  09/14/2017  . COLONOSCOPY    . DENTAL SURGERY Left 10/2016   dental implant  . DILATION AND CURETTAGE OF UTERUS  X 4  . ESOPHAGOGASTRODUODENOSCOPY    . FLEXIBLE SIGMOIDOSCOPY N/A 06/21/2012   Procedure: FLEXIBLE SIGMOIDOSCOPY;  Surgeon: Jerene Bears, MD;  Location: [facility];  Service: Gastroenterology;  Laterality: N/A;  . JOINT REPLACEMENT    . LEFT HEART CATH AND CORONARY ANGIOGRAPHY N/A 09/14/2017   Procedure: LEFT HEART CATH AND CORONARY ANGIOGRAPHY;  Surgeon: Belva Crome, MD;  Location: [facility];  Service: Cardiovascular;  Laterality: N/A;  . SHOULDER ARTHROSCOPY Right    "shaved spurs off rotator cuff"  . TONSILLECTOMY    . TOTAL HIP ARTHROPLASTY Right 06/09/2013   Procedure: TOTAL HIP ARTHROPLASTY;  Surgeon: Kerin Salen, MD;  Location: [facility];  Service: Orthopedics;  Laterality: Right;  . TUBAL LIGATION    . TUMOR EXCISION Right 1968   angle of jaw; benign    No current facility-administered medications for this encounter.    Current Outpatient Medications  Medication Sig  Dispense Refill Last Dose  . acetaminophen (TYLENOL) 500 MG tablet Take 1,000 mg by mouth 3 (three) times daily.     Marland Kitchen ALPRAZolam (XANAX) 0.5 MG tablet Take 1 tablet (0.5 mg total) by mouth 4 (four) times daily as needed for anxiety. (Patient taking differently: Take 0.5 mg by mouth 4 (four) times daily. ) 120 tablet 2   . atorvastatin (LIPITOR) 40 MG tablet Take 40 mg by mouth at bedtime.   0   . celecoxib (CELEBREX) 200 MG capsule Take 200 mg by mouth at bedtime.     . Cholecalciferol (VITAMIN D) 50 MCG (2000 UT) tablet Take 2,000 Units by mouth daily.     . diclofenac Sodium (VOLTAREN) 1 % GEL Apply 1 application topically  4 (four) times daily as needed (knee pain).     Marland Kitchen dicyclomine (BENTYL) 10 MG capsule Take 1 capsule (10 mg total) by mouth every 8 (eight) hours as needed for spasms. 20 capsule 0   . diphenoxylate-atropine (LOMOTIL) 2.5-0.025 MG tablet Take 1 tablet by mouth 2 (two) times daily as needed for diarrhea or loose stools. 30 tablet 0   . divalproex (DEPAKOTE ER) 250 MG 24 hr tablet TAKE 2 TABLETS(500 MG) BY MOUTH AT BEDTIME (Patient taking differently: Take 500 mg by mouth at bedtime. ) 180 tablet 1   . donepezil (ARICEPT) 10 MG tablet Take 1 tablet at Bedtime (Patient taking differently: Take 10 mg by mouth at bedtime. ) 90 tablet 1   . fluticasone (FLONASE) 50 MCG/ACT nasal spray Place 1 spray into both nostrils daily as needed for allergies or rhinitis.     Marland Kitchen gabapentin (NEURONTIN) 400 MG capsule Take 400 mg by mouth 3 (three) times daily.      . Insulin Glargine (LANTUS SOLOSTAR) 100 UNIT/ML Solostar Pen Inject 48 units under the skin once daily. (Patient taking differently: Inject 54 Units into the skin every evening. At 1700) 5 pen 3   . lithium carbonate (ESKALITH) 450 MG CR tablet TAKE 1 TABLET(450 MG) BY MOUTH AT BEDTIME (Patient taking differently: Take 450 mg by mouth at bedtime. ) 90 tablet 0   . loperamide (IMODIUM A-D) 2 MG tablet Take 2 mg by mouth 2 (two) times daily  as needed for diarrhea or loose stools.     Marland Kitchen losartan (COZAAR) 50 MG tablet Take 25 mg by mouth daily.   0   . Melatonin 10 MG TBCR Take 10 mg by mouth at bedtime.     . memantine (NAMENDA) 10 MG tablet TAKE 1 TABLET(10 MG) BY MOUTH EVERY EVENING (Patient taking differently: Take 10 mg by mouth every evening. At 1700) 90 tablet 0   . Mesalamine 800 MG TBEC Take 2 tablets (1,600 mg total) by mouth 3 (three) times daily for 3 days. (Patient taking differently: Take 1,600 mg by mouth 2 (two) times daily as needed (ulcerative colitis flare). ) 270 tablet 0   . methocarbamol (ROBAXIN) 500 MG tablet Take 500 mg by mouth 2 (two) times daily as needed for muscle spasms.     . nitroGLYCERIN (NITROSTAT) 0.4 MG SL tablet Place 1 tablet (0.4 mg total) under the tongue every 5 (five) minutes as needed for chest pain. 25 tablet 2   . ondansetron (ZOFRAN ODT) 4 MG disintegrating tablet 4mg  ODT q4 hours prn nausea/vomit (Patient taking differently: Take 4 mg by mouth every 6 (six) hours as needed for nausea or vomiting. ) 12 tablet 0   . polyethylene glycol (MIRALAX) packet Take 17 g by mouth daily as needed. (Patient taking differently: Take 8.5-17 g by mouth daily as needed for moderate constipation. ) 14 each 0   . QUEtiapine (SEROQUEL) 100 MG tablet TAKE 1 AND 1/2 TABLETS BY MOUTH EVERY MORNING AND 2 TABLETS EVERY NIGHT AT BEDTIME (Patient taking differently: Take 200 mg by mouth See admin instructions. Take 200 mg at 5pm and at bedtime with the ropinirole) 360 tablet 0   . rOPINIRole (REQUIP) 0.5 MG tablet Take 3 tablets (1.5 mg total) by mouth 3 (three) times daily. (Patient taking differently: Take 0.5-1.5 mg by mouth See admin instructions. Take 1.5 mg twice daily at 5pm and bedtime with seroquel, may take an additional 0.5 mg dose during the day as needed for restless  legs) 540 tablet 0   . zaleplon (SONATA) 5 MG capsule TAKE 1 CAPSULE(5 MG) BY MOUTH AT BEDTIME (Patient taking differently: Take 5 mg by mouth at  bedtime. ) 30 capsule 1   . BD PEN NEEDLE NANO U/F 32G X 4 MM MISC USE TO INJECT 5 TIMES DAILY AS DIRECTED 300 each 0   . doxepin (SINEQUAN) 10 MG capsule Take 1 capsule (10 mg total) by mouth at bedtime as needed. (Patient not taking: Reported on 01/27/2019) 30 capsule 1 Not Taking at Unknown time  . glucose blood (CONTOUR NEXT TEST) test strip Use to test blood sugar 2 times daily 100 each 3   . levothyroxine (SYNTHROID) 75 MCG tablet TAKE 1 TABLET BY MOUTH DAILY BEFORE BREAKFAST 90 tablet 0   . Microlet Lancets MISC USE DAILY AS DIRECTED 100 each 3   . oxyCODONE (OXY IR/ROXICODONE) 5 MG immediate release tablet Take 5 mg by mouth every 6 (six) hours as needed for severe pain.     Marland Kitchen oxyCODONE-acetaminophen (PERCOCET/ROXICET) 5-325 MG tablet Take 1 tablet by mouth every 6 (six) hours as needed for severe pain. (Patient not taking: Reported on 01/27/2019) 10 tablet 0 Completed Course at Unknown time  . OZEMPIC, 1 MG/DOSE, 2 MG/1.5ML SOPN INJECT 1 MG UNDER THE SKIN ONCE A WEEK AS DIRECTED 3 mL 2    Allergies  Allergen Reactions  . Codeine Anxiety and Other (See Comments)    Hallucinations, tolerates oxycodone   . Macrolides And Ketolides Nausea And Vomiting  . Procaine Hcl Palpitations  . Aspirin Nausea And Vomiting and Other (See Comments)    Reaction:  Burns pts stomach   . Benadryl [Diphenhydramine] Other (See Comments)    Per MD "inhibits potency of gabapentin, lithium etc"  . Diflucan [Fluconazole] Other (See Comments)    Unknown reaction   . Dilaudid [Hydromorphone Hcl] Other (See Comments)    Migraines and nightmares   . Sulfa Antibiotics Other (See Comments)    Unknown reaction    Social History   Tobacco Use  . Smoking status: Never Smoker  . Smokeless tobacco: Never Used  Substance Use Topics  . Alcohol use: Not Currently    Comment: 09/14/2017 "nothing since 04/15/2008"    Family History  Problem Relation Age of Onset  . Drug abuse Mother   . Alcohol abuse Mother   .  Alcohol abuse Father   . Hypertension Father   . CAD Brother   . Hypertension Brother   . Alcohol abuse Brother   . Hypertension Brother   . Hypertension Brother   . Psoriasis Daughter   . Arthritis Daughter        psoriatic arthritis   . Alcohol abuse Grandchild      Review of Systems  Constitutional: Negative.   HENT: Negative.   Eyes: Negative.   Respiratory: Negative.   Cardiovascular: Negative.   Gastrointestinal: Negative.   Genitourinary: Negative.   Musculoskeletal: Positive for back pain and joint pain.  Skin: Negative.   Neurological: Negative.   Endo/Heme/Allergies: Positive for environmental allergies.  Psychiatric/Behavioral: Positive for depression. The patient is nervous/anxious.     Objective:  Physical Exam  Constitutional: She is oriented to person, place, and time. She appears well-developed.  HENT:  Head: Normocephalic.  Eyes: Pupils are equal, round, and reactive to light.  Neck: Neck supple. No JVD present. No tracheal deviation present. No thyromegaly present.  Cardiovascular: Normal rate, regular rhythm and intact distal pulses.  Respiratory: Effort normal and breath  sounds normal. No respiratory distress. She has no wheezes.  GI: Soft. There is no abdominal tenderness. There is no guarding.  Musculoskeletal:     Left knee: She exhibits decreased range of motion, swelling and bony tenderness. She exhibits no ecchymosis, no deformity, no laceration and no erythema. Tenderness found.  Lymphadenopathy:    She has no cervical adenopathy.  Neurological: She is alert and oriented to person, place, and time.  Skin: Skin is warm and dry.  Psychiatric: She has a normal mood and affect.      Labs:  Estimated body mass index is 29.26 kg/m as calculated from the following:   Height as of 01/26/19: 5\' 2"  (1.575 m).   Weight as of 01/26/19: 72.6 kg.   Imaging Review Plain radiographs demonstrate severe degenerative joint disease of the left  knee(s).  The bone quality appears to be good for age and reported activity level.      Assessment/Plan:  End stage arthritis, left knee   The patient history, physical examination, clinical judgment of the provider and imaging studies are consistent with end stage degenerative joint disease of the left knee(s) and total knee arthroplasty is deemed medically necessary. The treatment options including medical management, injection therapy arthroscopy and arthroplasty were discussed at length. The risks and benefits of total knee arthroplasty were presented and reviewed. The risks due to aseptic loosening, infection, stiffness, patella tracking problems, thromboembolic complications and other imponderables were discussed. The patient acknowledged the explanation, agreed to proceed with the plan and consent was signed. Patient is being admitted for inpatient treatment for surgery, pain control, PT, OT, prophylactic antibiotics, VTE prophylaxis, progressive ambulation and ADL's and discharge planning. The patient is planning to be discharged home.     Patient's anticipated LOS is less than 2 midnights, meeting these requirements: - Lives within 1 hour of care - Has a competent adult at home to recover with post-op recover - NO history of  - Chronic pain requiring opiods  - Coronary Artery Disease  - Heart failure  - Heart attack  - Stroke  - DVT/VTE  - Cardiac arrhythmia  - Respiratory Failure/COPD  - Renal failure  - Anemia  - Advanced Liver disease    [place] Pugh. Teagan Ozawa   PA-C  01/31/2019, 1:55 PM

## 2019-02-01 ENCOUNTER — Other Ambulatory Visit: Payer: Self-pay

## 2019-02-01 ENCOUNTER — Encounter (HOSPITAL_COMMUNITY)
Admission: RE | Admit: 2019-02-01 | Discharge: 2019-02-01 | Disposition: A | Discharge status: Home / Self Care | Payer: Medicare Other | Source: Ambulatory Visit | Attending: Orthopedic Surgery | Admitting: Orthopedic Surgery

## 2019-02-01 ENCOUNTER — Encounter (HOSPITAL_COMMUNITY): Payer: Self-pay

## 2019-02-01 DIAGNOSIS — Z79890 Hormone replacement therapy (postmenopausal): Secondary | ICD-10-CM | POA: Diagnosis not present

## 2019-02-01 DIAGNOSIS — E039 Hypothyroidism, unspecified: Secondary | ICD-10-CM | POA: Diagnosis not present

## 2019-02-01 DIAGNOSIS — E1142 Type 2 diabetes mellitus with diabetic polyneuropathy: Secondary | ICD-10-CM | POA: Insufficient documentation

## 2019-02-01 DIAGNOSIS — M1712 Unilateral primary osteoarthritis, left knee: Secondary | ICD-10-CM | POA: Diagnosis not present

## 2019-02-01 DIAGNOSIS — Z01812 Encounter for preprocedural laboratory examination: Secondary | ICD-10-CM | POA: Diagnosis not present

## 2019-02-01 DIAGNOSIS — Z79899 Other long term (current) drug therapy: Secondary | ICD-10-CM | POA: Diagnosis not present

## 2019-02-01 DIAGNOSIS — I10 Essential (primary) hypertension: Secondary | ICD-10-CM | POA: Insufficient documentation

## 2019-02-01 DIAGNOSIS — Z794 Long term (current) use of insulin: Secondary | ICD-10-CM | POA: Diagnosis not present

## 2019-02-01 LAB — BASIC METABOLIC PANEL
Anion gap: 9 (ref 5–15)
BUN: 15 mg/dL (ref 8–23)
CO2: 24 mmol/L (ref 22–32)
Calcium: 10.2 mg/dL (ref 8.9–10.3)
Chloride: 105 mmol/L (ref 98–111)
Creatinine, Ser: 0.86 mg/dL (ref 0.44–1.00)
GFR calc Af Amer: >60 mL/min (ref >60)
GFR calc non Af Amer: >60 mL/min (ref >60)
Glucose, Bld: 118 mg/dL — ABNORMAL HIGH (ref 70–99)
Potassium: 4.4 mmol/L (ref 3.5–5.1)
Sodium: 138 mmol/L (ref 135–145)

## 2019-02-01 LAB — CBC
HCT: 46.4 % — ABNORMAL HIGH (ref 36.0–46.0)
Hemoglobin: 14.2 g/dL (ref 12.0–15.0)
MCH: 28.7 pg (ref 26.0–34.0)
MCHC: 30.6 g/dL (ref 30.0–36.0)
MCV: 93.9 fL (ref 80.0–100.0)
Platelets: 242 10*3/uL (ref 150–400)
RBC: 4.94 MIL/uL (ref 3.87–5.11)
RDW: 13.9 % (ref 11.5–15.5)
WBC: 8.6 10*3/uL (ref 4.0–10.5)
nRBC: 0 % (ref 0.0–0.2)

## 2019-02-01 LAB — SURGICAL PCR SCREEN
MRSA, PCR: NEGATIVE
Staphylococcus aureus: NEGATIVE

## 2019-02-01 LAB — HEMOGLOBIN A1C
Hgb A1c MFr Bld: 5.7 % — ABNORMAL HIGH (ref 4.8–5.6)
Mean Plasma Glucose: 116.89 mg/dL

## 2019-02-01 LAB — GLUCOSE, CAPILLARY: Glucose-Capillary: 93 mg/dL (ref 70–99)

## 2019-02-01 LAB — ABO/RH: ABO/RH(D): O POS

## 2019-02-01 NOTE — Progress Notes (Signed)
PCP - Dr. Orland Mustard Cardiologist - Dr. Linard Millers  Chest x-ray - 07/20/18 EKG -11/18/18  Stress Test - no ECHO - no Cardiac Cath - 09/14/17. WNL  Sleep Study -yes  Negative results CPAP - no  Fasting Blood Sugar - 120-130 Checks Blood Sugar _QD____ times a day  Blood Thinner Instructions:NA Aspirin Instructions: Last Dose:  Anesthesia review:   Patient denies shortness of breath, fever, cough and chest pain at PAT appointment yes  Patient verbalized understanding of instructions that were given to them at the PAT appointment. Patient was also instructed that they will need to review over the PAT instructions again at home before surgery. yes

## 2019-02-03 ENCOUNTER — Other Ambulatory Visit (HOSPITAL_COMMUNITY)
Admission: RE | Admit: 2019-02-03 | Discharge: 2019-02-03 | Disposition: A | Discharge status: Home / Self Care | Payer: Medicare Other | Source: Ambulatory Visit | Attending: Orthopedic Surgery | Admitting: Orthopedic Surgery

## 2019-02-03 DIAGNOSIS — Z20828 Contact with and (suspected) exposure to other viral communicable diseases: Secondary | ICD-10-CM | POA: Diagnosis not present

## 2019-02-03 DIAGNOSIS — Z01812 Encounter for preprocedural laboratory examination: Secondary | ICD-10-CM | POA: Insufficient documentation

## 2019-02-03 LAB — SARS CORONAVIRUS 2 (TAT 6-24 HRS): SARS Coronavirus 2: NEGATIVE

## 2019-02-07 ENCOUNTER — Other Ambulatory Visit: Payer: Self-pay

## 2019-02-07 ENCOUNTER — Observation Stay (HOSPITAL_COMMUNITY)
Admission: RE | Admit: 2019-02-07 | Discharge: 2019-02-08 | Disposition: A | Discharge status: Home / Self Care | Payer: Medicare Other | Attending: Orthopedic Surgery | Admitting: Orthopedic Surgery

## 2019-02-07 ENCOUNTER — Encounter (HOSPITAL_COMMUNITY)
Admission: RE | Disposition: A | Discharge status: Home / Self Care | Payer: Self-pay | Source: Home / Self Care | Attending: Orthopedic Surgery

## 2019-02-07 ENCOUNTER — Ambulatory Visit (HOSPITAL_COMMUNITY): Payer: Medicare Other | Admitting: Physician Assistant

## 2019-02-07 ENCOUNTER — Encounter (HOSPITAL_COMMUNITY): Payer: Self-pay | Admitting: Anesthesiology

## 2019-02-07 ENCOUNTER — Ambulatory Visit (HOSPITAL_COMMUNITY): Payer: Medicare Other | Admitting: Anesthesiology

## 2019-02-07 ENCOUNTER — Other Ambulatory Visit: Payer: Self-pay | Admitting: Endocrinology

## 2019-02-07 DIAGNOSIS — E039 Hypothyroidism, unspecified: Secondary | ICD-10-CM | POA: Diagnosis not present

## 2019-02-07 DIAGNOSIS — Z96641 Presence of right artificial hip joint: Secondary | ICD-10-CM | POA: Insufficient documentation

## 2019-02-07 DIAGNOSIS — H9193 Unspecified hearing loss, bilateral: Secondary | ICD-10-CM | POA: Insufficient documentation

## 2019-02-07 DIAGNOSIS — Z791 Long term (current) use of non-steroidal anti-inflammatories (NSAID): Secondary | ICD-10-CM | POA: Insufficient documentation

## 2019-02-07 DIAGNOSIS — Z20828 Contact with and (suspected) exposure to other viral communicable diseases: Secondary | ICD-10-CM | POA: Diagnosis not present

## 2019-02-07 DIAGNOSIS — M329 Systemic lupus erythematosus, unspecified: Secondary | ICD-10-CM | POA: Insufficient documentation

## 2019-02-07 DIAGNOSIS — M25462 Effusion, left knee: Secondary | ICD-10-CM | POA: Diagnosis not present

## 2019-02-07 DIAGNOSIS — Z79899 Other long term (current) drug therapy: Secondary | ICD-10-CM | POA: Diagnosis not present

## 2019-02-07 DIAGNOSIS — E559 Vitamin D deficiency, unspecified: Secondary | ICD-10-CM | POA: Insufficient documentation

## 2019-02-07 DIAGNOSIS — Z794 Long term (current) use of insulin: Secondary | ICD-10-CM | POA: Insufficient documentation

## 2019-02-07 DIAGNOSIS — G2581 Restless legs syndrome: Secondary | ICD-10-CM | POA: Diagnosis not present

## 2019-02-07 DIAGNOSIS — K589 Irritable bowel syndrome without diarrhea: Secondary | ICD-10-CM | POA: Insufficient documentation

## 2019-02-07 DIAGNOSIS — I10 Essential (primary) hypertension: Secondary | ICD-10-CM | POA: Diagnosis not present

## 2019-02-07 DIAGNOSIS — E785 Hyperlipidemia, unspecified: Secondary | ICD-10-CM | POA: Insufficient documentation

## 2019-02-07 DIAGNOSIS — F319 Bipolar disorder, unspecified: Secondary | ICD-10-CM | POA: Insufficient documentation

## 2019-02-07 DIAGNOSIS — E1142 Type 2 diabetes mellitus with diabetic polyneuropathy: Secondary | ICD-10-CM | POA: Diagnosis not present

## 2019-02-07 DIAGNOSIS — Z96652 Presence of left artificial knee joint: Secondary | ICD-10-CM

## 2019-02-07 DIAGNOSIS — J309 Allergic rhinitis, unspecified: Secondary | ICD-10-CM | POA: Diagnosis not present

## 2019-02-07 DIAGNOSIS — M25762 Osteophyte, left knee: Secondary | ICD-10-CM | POA: Diagnosis not present

## 2019-02-07 DIAGNOSIS — G8918 Other acute postprocedural pain: Secondary | ICD-10-CM | POA: Diagnosis not present

## 2019-02-07 DIAGNOSIS — Z79890 Hormone replacement therapy (postmenopausal): Secondary | ICD-10-CM | POA: Insufficient documentation

## 2019-02-07 DIAGNOSIS — M1712 Unilateral primary osteoarthritis, left knee: Principal | ICD-10-CM | POA: Insufficient documentation

## 2019-02-07 DIAGNOSIS — E781 Pure hyperglyceridemia: Secondary | ICD-10-CM | POA: Insufficient documentation

## 2019-02-07 DIAGNOSIS — F419 Anxiety disorder, unspecified: Secondary | ICD-10-CM | POA: Insufficient documentation

## 2019-02-07 DIAGNOSIS — E663 Overweight: Secondary | ICD-10-CM | POA: Diagnosis present

## 2019-02-07 HISTORY — PX: TOTAL KNEE ARTHROPLASTY: SHX125

## 2019-02-07 LAB — GLUCOSE, CAPILLARY
Glucose-Capillary: 102 mg/dL — ABNORMAL HIGH (ref 70–99)
Glucose-Capillary: 100 mg/dL — ABNORMAL HIGH (ref 70–99)
Glucose-Capillary: 159 mg/dL — ABNORMAL HIGH (ref 70–99)
Glucose-Capillary: 132 mg/dL — ABNORMAL HIGH (ref 70–99)
Glucose-Capillary: 189 mg/dL — ABNORMAL HIGH (ref 70–99)
Glucose-Capillary: 153 mg/dL — ABNORMAL HIGH (ref 70–99)

## 2019-02-07 LAB — TYPE AND SCREEN
ABO/RH(D): O POS
Antibody Screen: NEGATIVE

## 2019-02-07 SURGERY — ARTHROPLASTY, KNEE, TOTAL
Anesthesia: Spinal | Site: Knee | Laterality: Left

## 2019-02-07 MED ORDER — MEPERIDINE HCL 50 MG/ML IJ SOLN: 6.2500 mg | INTRAMUSCULAR | Status: DC | PRN

## 2019-02-07 MED ORDER — PHENOL 1.4 % MT LIQD: 1.0000 | OROMUCOSAL | Status: DC | PRN

## 2019-02-07 MED ORDER — SODIUM CHLORIDE 0.9 % IV SOLN
INTRAVENOUS | Status: DC
  Administered 2019-02-07: 15:00:00 via INTRAVENOUS

## 2019-02-07 MED ORDER — DONEPEZIL HCL 10 MG PO TABS
10.0000 mg | ORAL_TABLET | Freq: Every day | ORAL | Status: DC
  Administered 2019-02-07: 10 mg via ORAL
  Filled 2019-02-07: qty 1

## 2019-02-07 MED ORDER — SODIUM CHLORIDE (PF) 0.9 % IJ SOLN
INTRAMUSCULAR | Status: DC | PRN
  Administered 2019-02-07: 30 mL

## 2019-02-07 MED ORDER — RIVAROXABAN 10 MG PO TABS
10.0000 mg | ORAL_TABLET | ORAL | Status: DC
  Administered 2019-02-08: 10 mg via ORAL
  Filled 2019-02-07: qty 1

## 2019-02-07 MED ORDER — ATORVASTATIN CALCIUM 40 MG PO TABS
40.0000 mg | ORAL_TABLET | Freq: Every day | ORAL | Status: DC
  Administered 2019-02-07: 40 mg via ORAL
  Filled 2019-02-07: qty 1

## 2019-02-07 MED ORDER — LOSARTAN POTASSIUM 25 MG PO TABS
25.0000 mg | ORAL_TABLET | Freq: Every day | ORAL | Status: DC
  Filled 2019-02-07: qty 1

## 2019-02-07 MED ORDER — METOCLOPRAMIDE HCL 5 MG/ML IJ SOLN: 5.0000 mg | Freq: Three times a day (TID) | INTRAMUSCULAR | Status: DC | PRN

## 2019-02-07 MED ORDER — TRANEXAMIC ACID-NACL 1000-0.7 MG/100ML-% IV SOLN
1000.0000 mg | INTRAVENOUS | Status: AC
  Administered 2019-02-07: 1000 mg via INTRAVENOUS
  Filled 2019-02-07: qty 100

## 2019-02-07 MED ORDER — POLYETHYLENE GLYCOL 3350 17 G PO PACK
17.0000 g | PACK | Freq: Two times a day (BID) | ORAL | Status: DC
  Filled 2019-02-07: qty 1

## 2019-02-07 MED ORDER — ACETAMINOPHEN 500 MG PO TABS
1000.0000 mg | ORAL_TABLET | Freq: Four times a day (QID) | ORAL | Status: AC
  Administered 2019-02-07 – 2019-02-08 (×4): 1000 mg via ORAL
  Filled 2019-02-07 (×4): qty 2

## 2019-02-07 MED ORDER — ZOLPIDEM TARTRATE 5 MG PO TABS: 5.0000 mg | ORAL_TABLET | Freq: Every evening | ORAL | Status: DC | PRN

## 2019-02-07 MED ORDER — DOCUSATE SODIUM 100 MG PO CAPS
100.0000 mg | ORAL_CAPSULE | Freq: Two times a day (BID) | ORAL | Status: DC
  Administered 2019-02-07 – 2019-02-08 (×2): 100 mg via ORAL
  Filled 2019-02-07 (×2): qty 1

## 2019-02-07 MED ORDER — MEMANTINE HCL 10 MG PO TABS
10.0000 mg | ORAL_TABLET | Freq: Every evening | ORAL | Status: DC
  Administered 2019-02-07: 10 mg via ORAL
  Filled 2019-02-07: qty 1

## 2019-02-07 MED ORDER — MENTHOL 3 MG MT LOZG: 1.0000 | LOZENGE | OROMUCOSAL | Status: DC | PRN

## 2019-02-07 MED ORDER — BUPIVACAINE IN DEXTROSE 0.75-8.25 % IT SOLN
INTRATHECAL | Status: DC | PRN
  Administered 2019-02-07: 1.7 mL via INTRATHECAL

## 2019-02-07 MED ORDER — EPHEDRINE SULFATE-NACL 50-0.9 MG/10ML-% IV SOSY
PREFILLED_SYRINGE | INTRAVENOUS | Status: DC | PRN
  Administered 2019-02-07 (×5): 5 mg via INTRAVENOUS

## 2019-02-07 MED ORDER — DICYCLOMINE HCL 10 MG PO CAPS
10.0000 mg | ORAL_CAPSULE | Freq: Three times a day (TID) | ORAL | Status: DC | PRN
  Filled 2019-02-07: qty 1

## 2019-02-07 MED ORDER — FLUTICASONE PROPIONATE 50 MCG/ACT NA SUSP: 1.0000 | Freq: Every day | NASAL | Status: DC | PRN

## 2019-02-07 MED ORDER — OXYCODONE HCL 5 MG PO TABS
10.0000 mg | ORAL_TABLET | ORAL | Status: DC | PRN
  Administered 2019-02-07: 10 mg via ORAL
  Administered 2019-02-07 – 2019-02-08 (×4): 15 mg via ORAL
  Filled 2019-02-07: qty 3
  Filled 2019-02-07: qty 2
  Filled 2019-02-07 (×3): qty 3

## 2019-02-07 MED ORDER — INSULIN GLARGINE 100 UNIT/ML ~~LOC~~ SOLN
54.0000 [IU] | Freq: Every evening | SUBCUTANEOUS | Status: DC
  Administered 2019-02-07: 54 [IU] via SUBCUTANEOUS
  Filled 2019-02-07 (×2): qty 0.54

## 2019-02-07 MED ORDER — MAGNESIUM CITRATE PO SOLN: 1.0000 | Freq: Once | ORAL | Status: DC | PRN

## 2019-02-07 MED ORDER — SODIUM CHLORIDE (PF) 0.9 % IJ SOLN
INTRAMUSCULAR | Status: AC
  Filled 2019-02-07: qty 50

## 2019-02-07 MED ORDER — CEFAZOLIN SODIUM-DEXTROSE 2-4 GM/100ML-% IV SOLN
2.0000 g | Freq: Four times a day (QID) | INTRAVENOUS | Status: AC
  Administered 2019-02-07 (×2): 2 g via INTRAVENOUS
  Filled 2019-02-07 (×2): qty 100

## 2019-02-07 MED ORDER — LITHIUM CARBONATE ER 450 MG PO TBCR
450.0000 mg | EXTENDED_RELEASE_TABLET | Freq: Every day | ORAL | Status: DC
  Administered 2019-02-07: 450 mg via ORAL
  Filled 2019-02-07 (×2): qty 1

## 2019-02-07 MED ORDER — METHOCARBAMOL 500 MG PO TABS
500.0000 mg | ORAL_TABLET | Freq: Four times a day (QID) | ORAL | Status: DC | PRN
  Administered 2019-02-08: 500 mg via ORAL
  Filled 2019-02-07: qty 1

## 2019-02-07 MED ORDER — FERROUS SULFATE 325 (65 FE) MG PO TABS
325.0000 mg | ORAL_TABLET | Freq: Two times a day (BID) | ORAL | Status: DC
  Administered 2019-02-08: 325 mg via ORAL
  Filled 2019-02-07: qty 1

## 2019-02-07 MED ORDER — KETOROLAC TROMETHAMINE 30 MG/ML IJ SOLN
INTRAMUSCULAR | Status: DC | PRN
  Administered 2019-02-07: 30 mg

## 2019-02-07 MED ORDER — KETOROLAC TROMETHAMINE 30 MG/ML IJ SOLN
INTRAMUSCULAR | Status: AC
  Filled 2019-02-07: qty 1

## 2019-02-07 MED ORDER — CHLORHEXIDINE GLUCONATE 4 % EX LIQD: 60.0000 mL | Freq: Once | CUTANEOUS | Status: DC

## 2019-02-07 MED ORDER — ALPRAZOLAM 0.5 MG PO TABS
0.5000 mg | ORAL_TABLET | Freq: Four times a day (QID) | ORAL | Status: DC | PRN
  Administered 2019-02-07 – 2019-02-08 (×2): 0.5 mg via ORAL
  Filled 2019-02-07 (×2): qty 1

## 2019-02-07 MED ORDER — ROPINIROLE HCL 0.5 MG PO TABS
1.5000 mg | ORAL_TABLET | ORAL | Status: DC
  Administered 2019-02-07 (×2): 1.5 mg via ORAL
  Filled 2019-02-07 (×2): qty 3

## 2019-02-07 MED ORDER — MIDAZOLAM HCL 2 MG/2ML IJ SOLN
INTRAMUSCULAR | Status: AC
  Filled 2019-02-07: qty 2

## 2019-02-07 MED ORDER — DEXAMETHASONE SODIUM PHOSPHATE 10 MG/ML IJ SOLN
INTRAMUSCULAR | Status: DC | PRN
  Administered 2019-02-07: 4 mg via INTRAVENOUS

## 2019-02-07 MED ORDER — MESALAMINE 400 MG PO CPDR
1600.0000 mg | DELAYED_RELEASE_CAPSULE | Freq: Two times a day (BID) | ORAL | Status: DC | PRN
  Filled 2019-02-07: qty 4

## 2019-02-07 MED ORDER — PHENYLEPHRINE HCL-NACL 10-0.9 MG/250ML-% IV SOLN
INTRAVENOUS | Status: DC | PRN
  Administered 2019-02-07: 15 ug/min via INTRAVENOUS

## 2019-02-07 MED ORDER — NITROGLYCERIN 0.4 MG SL SUBL: 0.4000 mg | SUBLINGUAL_TABLET | SUBLINGUAL | Status: DC | PRN

## 2019-02-07 MED ORDER — DIVALPROEX SODIUM ER 500 MG PO TB24
500.0000 mg | ORAL_TABLET | Freq: Every day | ORAL | Status: DC
  Administered 2019-02-07: 500 mg via ORAL
  Filled 2019-02-07: qty 1

## 2019-02-07 MED ORDER — BUPIVACAINE-EPINEPHRINE 0.25% -1:200000 IJ SOLN
INTRAMUSCULAR | Status: AC
  Filled 2019-02-07: qty 1

## 2019-02-07 MED ORDER — MIDAZOLAM HCL 2 MG/2ML IJ SOLN: 0.5000 mg | Freq: Once | INTRAMUSCULAR | Status: DC | PRN

## 2019-02-07 MED ORDER — DEXAMETHASONE SODIUM PHOSPHATE 10 MG/ML IJ SOLN
10.0000 mg | Freq: Once | INTRAMUSCULAR | Status: AC
  Administered 2019-02-08: 10 mg via INTRAVENOUS
  Filled 2019-02-07: qty 1

## 2019-02-07 MED ORDER — GABAPENTIN 400 MG PO CAPS
400.0000 mg | ORAL_CAPSULE | Freq: Three times a day (TID) | ORAL | Status: DC
  Administered 2019-02-07 – 2019-02-08 (×3): 400 mg via ORAL
  Filled 2019-02-07 (×3): qty 1

## 2019-02-07 MED ORDER — BUPIVACAINE-EPINEPHRINE (PF) 0.25% -1:200000 IJ SOLN
INTRAMUSCULAR | Status: DC | PRN
  Administered 2019-02-07: 30 mL via PERINEURAL

## 2019-02-07 MED ORDER — PROMETHAZINE HCL 25 MG/ML IJ SOLN: 6.2500 mg | INTRAMUSCULAR | Status: DC | PRN

## 2019-02-07 MED ORDER — METHOCARBAMOL 500 MG IVPB - SIMPLE MED
500.0000 mg | Freq: Four times a day (QID) | INTRAVENOUS | Status: DC | PRN
  Filled 2019-02-07: qty 50

## 2019-02-07 MED ORDER — ONDANSETRON HCL 4 MG PO TABS: 4.0000 mg | ORAL_TABLET | Freq: Four times a day (QID) | ORAL | Status: DC | PRN

## 2019-02-07 MED ORDER — PROPOFOL 500 MG/50ML IV EMUL
INTRAVENOUS | Status: DC | PRN
  Administered 2019-02-07: 75 ug/kg/min via INTRAVENOUS

## 2019-02-07 MED ORDER — MIDAZOLAM HCL 2 MG/2ML IJ SOLN
1.0000 mg | Freq: Once | INTRAMUSCULAR | Status: AC
  Administered 2019-02-07: 1 mg via INTRAVENOUS
  Filled 2019-02-07: qty 2

## 2019-02-07 MED ORDER — PROPOFOL 500 MG/50ML IV EMUL
INTRAVENOUS | Status: AC
  Filled 2019-02-07: qty 150

## 2019-02-07 MED ORDER — FENTANYL CITRATE (PF) 100 MCG/2ML IJ SOLN
50.0000 ug | Freq: Once | INTRAMUSCULAR | Status: AC
  Administered 2019-02-07: 50 ug via INTRAVENOUS
  Filled 2019-02-07: qty 2

## 2019-02-07 MED ORDER — LACTATED RINGERS IV SOLN
INTRAVENOUS | Status: DC
  Administered 2019-02-07 (×2): via INTRAVENOUS

## 2019-02-07 MED ORDER — POVIDONE-IODINE 10 % EX SWAB
2.0000 "application " | Freq: Once | CUTANEOUS | Status: AC
  Administered 2019-02-07: 2 via TOPICAL

## 2019-02-07 MED ORDER — INSULIN ASPART 100 UNIT/ML ~~LOC~~ SOLN
0.0000 [IU] | Freq: Three times a day (TID) | SUBCUTANEOUS | Status: DC
  Administered 2019-02-07 – 2019-02-08 (×2): 3 [IU] via SUBCUTANEOUS
  Administered 2019-02-08: 2 [IU] via SUBCUTANEOUS

## 2019-02-07 MED ORDER — TRANEXAMIC ACID-NACL 1000-0.7 MG/100ML-% IV SOLN
1000.0000 mg | Freq: Once | INTRAVENOUS | Status: AC
  Administered 2019-02-07: 1000 mg via INTRAVENOUS
  Filled 2019-02-07: qty 100

## 2019-02-07 MED ORDER — ROPIVACAINE HCL 7.5 MG/ML IJ SOLN
INTRAMUSCULAR | Status: DC | PRN
  Administered 2019-02-07: 20 mL via PERINEURAL

## 2019-02-07 MED ORDER — BISACODYL 10 MG RE SUPP: 10.0000 mg | Freq: Every day | RECTAL | Status: DC | PRN

## 2019-02-07 MED ORDER — PROPOFOL 10 MG/ML IV BOLUS
INTRAVENOUS | Status: DC | PRN
  Administered 2019-02-07: 20 mg via INTRAVENOUS

## 2019-02-07 MED ORDER — ROPINIROLE HCL 0.5 MG PO TABS: 0.5000 mg | ORAL_TABLET | Freq: Every day | ORAL | Status: DC | PRN

## 2019-02-07 MED ORDER — ALUM & MAG HYDROXIDE-SIMETH 200-200-20 MG/5ML PO SUSP: 15.0000 mL | ORAL | Status: DC | PRN

## 2019-02-07 MED ORDER — OXYCODONE HCL 5 MG PO TABS
5.0000 mg | ORAL_TABLET | ORAL | Status: DC | PRN
  Administered 2019-02-07: 5 mg via ORAL
  Filled 2019-02-07: qty 1

## 2019-02-07 MED ORDER — METOCLOPRAMIDE HCL 5 MG PO TABS: 5.0000 mg | ORAL_TABLET | Freq: Three times a day (TID) | ORAL | Status: DC | PRN

## 2019-02-07 MED ORDER — FENTANYL CITRATE (PF) 100 MCG/2ML IJ SOLN: 25.0000 ug | INTRAMUSCULAR | Status: DC | PRN

## 2019-02-07 MED ORDER — HYDROMORPHONE HCL 1 MG/ML IJ SOLN: 0.5000 mg | INTRAMUSCULAR | Status: DC | PRN

## 2019-02-07 MED ORDER — ONDANSETRON HCL 4 MG/2ML IJ SOLN: 4.0000 mg | Freq: Four times a day (QID) | INTRAMUSCULAR | Status: DC | PRN

## 2019-02-07 MED ORDER — LEVOTHYROXINE SODIUM 75 MCG PO TABS
75.0000 ug | ORAL_TABLET | Freq: Every day | ORAL | Status: DC
  Administered 2019-02-08: 75 ug via ORAL
  Filled 2019-02-07: qty 1

## 2019-02-07 MED ORDER — QUETIAPINE FUMARATE 50 MG PO TABS
200.0000 mg | ORAL_TABLET | ORAL | Status: DC
  Administered 2019-02-07 (×2): 200 mg via ORAL
  Filled 2019-02-07 (×2): qty 4

## 2019-02-07 MED ORDER — CEFAZOLIN SODIUM-DEXTROSE 2-4 GM/100ML-% IV SOLN
2.0000 g | INTRAVENOUS | Status: AC
  Administered 2019-02-07: 2 g via INTRAVENOUS
  Filled 2019-02-07: qty 100

## 2019-02-07 MED ORDER — DEXAMETHASONE SODIUM PHOSPHATE 10 MG/ML IJ SOLN: 10.0000 mg | Freq: Once | INTRAMUSCULAR | Status: DC

## 2019-02-07 MED ORDER — MIDAZOLAM HCL 5 MG/5ML IJ SOLN
INTRAMUSCULAR | Status: DC | PRN
  Administered 2019-02-07: 1.5 mg via INTRAVENOUS
  Administered 2019-02-07: 0.5 mg via INTRAVENOUS

## 2019-02-07 MED ORDER — ONDANSETRON HCL 4 MG/2ML IJ SOLN
INTRAMUSCULAR | Status: DC | PRN
  Administered 2019-02-07: 4 mg via INTRAVENOUS

## 2019-02-07 SURGICAL SUPPLY — 64 items
ADH SKN CLS APL DERMABOND .7 (GAUZE/BANDAGES/DRESSINGS) ×1
ATTUNE MED ANAT PAT 32 KNEE (Knees) ×1 IMPLANT
ATTUNE PSFEM LTSZ3 NARCEM KNEE (Femur) ×1 IMPLANT
ATTUNE PSRP INSR SZ3 6 KNEE (Insert) ×1 IMPLANT
BAG SPEC THK2 15X12 ZIP CLS (MISCELLANEOUS)
BAG ZIPLOCK 12X15 (MISCELLANEOUS) IMPLANT
BASE TIBIAL ROT PLAT SZ 3 KNEE (Knees) IMPLANT
BLADE SAW SGTL 11.0X1.19X90.0M (BLADE) IMPLANT
BLADE SAW SGTL 13.0X1.19X90.0M (BLADE) ×2 IMPLANT
BLADE SURG SZ10 CARB STEEL (BLADE) ×4 IMPLANT
BNDG ELASTIC 6X5.8 VLCR STR LF (GAUZE/BANDAGES/DRESSINGS) ×2 IMPLANT
BOWL SMART MIX CTS (DISPOSABLE) ×2 IMPLANT
BSPLAT TIB 3 CMNT ROT PLAT STR (Knees) ×1 IMPLANT
CEMENT HV SMART SET (Cement) ×2 IMPLANT
COVER SURGICAL LIGHT HANDLE (MISCELLANEOUS) ×2 IMPLANT
COVER WAND RF STERILE (DRAPES) IMPLANT
CUFF TOURN SGL QUICK 34 (TOURNIQUET CUFF) ×2
CUFF TRNQT CYL 34X4.125X (TOURNIQUET CUFF) ×1 IMPLANT
DECANTER SPIKE VIAL GLASS SM (MISCELLANEOUS) ×4 IMPLANT
DERMABOND ADVANCED (GAUZE/BANDAGES/DRESSINGS) ×1
DERMABOND ADVANCED .7 DNX12 (GAUZE/BANDAGES/DRESSINGS) ×1 IMPLANT
DRAPE U-SHAPE 47X51 STRL (DRAPES) ×2 IMPLANT
DRESSING AQUACEL AG SP 3.5X10 (GAUZE/BANDAGES/DRESSINGS) ×1 IMPLANT
DRSG AQUACEL AG SP 3.5X10 (GAUZE/BANDAGES/DRESSINGS) ×2
DURAPREP 26ML APPLICATOR (WOUND CARE) ×4 IMPLANT
ELECT REM PT RETURN 15FT ADLT (MISCELLANEOUS) ×2 IMPLANT
GLOVE BIO SURGEON STRL SZ 6 (GLOVE) ×2 IMPLANT
GLOVE BIOGEL PI IND STRL 6.5 (GLOVE) ×1 IMPLANT
GLOVE BIOGEL PI IND STRL 7.5 (GLOVE) ×1 IMPLANT
GLOVE BIOGEL PI IND STRL 8.5 (GLOVE) ×1 IMPLANT
GLOVE BIOGEL PI INDICATOR 6.5 (GLOVE) ×1
GLOVE BIOGEL PI INDICATOR 7.5 (GLOVE) ×1
GLOVE BIOGEL PI INDICATOR 8.5 (GLOVE) ×1
GLOVE ECLIPSE 8.0 STRL XLNG CF (GLOVE) ×2 IMPLANT
GLOVE ORTHO TXT STRL SZ7.5 (GLOVE) ×2 IMPLANT
GOWN STRL REUS W/ TWL LRG LVL3 (GOWN DISPOSABLE) ×1 IMPLANT
GOWN STRL REUS W/TWL 2XL LVL3 (GOWN DISPOSABLE) ×2 IMPLANT
GOWN STRL REUS W/TWL LRG LVL3 (GOWN DISPOSABLE) ×4 IMPLANT
HANDPIECE INTERPULSE COAX TIP (DISPOSABLE) ×2
HOLDER FOLEY CATH W/STRAP (MISCELLANEOUS) IMPLANT
KIT TURNOVER KIT A (KITS) IMPLANT
MANIFOLD NEPTUNE II (INSTRUMENTS) ×2 IMPLANT
NDL SAFETY ECLIPSE 18X1.5 (NEEDLE) IMPLANT
NEEDLE HYPO 18GX1.5 SHARP (NEEDLE)
NS IRRIG 1000ML POUR BTL (IV SOLUTION) ×2 IMPLANT
PACK TOTAL KNEE CUSTOM (KITS) ×2 IMPLANT
PADDING CAST COTTON 6X4 STRL (CAST SUPPLIES) ×1 IMPLANT
PENCIL SMOKE EVACUATOR (MISCELLANEOUS) IMPLANT
PIN FIX SIGMA LCS THRD HI (PIN) ×1 IMPLANT
PIN THREADED HEADED SIGMA (PIN) ×1 IMPLANT
PROTECTOR NERVE ULNAR (MISCELLANEOUS) ×2 IMPLANT
SET HNDPC FAN SPRY TIP SCT (DISPOSABLE) ×1 IMPLANT
SET PAD KNEE POSITIONER (MISCELLANEOUS) ×2 IMPLANT
SUT MNCRL AB 4-0 PS2 18 (SUTURE) ×2 IMPLANT
SUT STRATAFIX PDS+ 0 24IN (SUTURE) ×2 IMPLANT
SUT VIC AB 1 CT1 36 (SUTURE) ×2 IMPLANT
SUT VIC AB 2-0 CT1 27 (SUTURE) ×6
SUT VIC AB 2-0 CT1 TAPERPNT 27 (SUTURE) ×3 IMPLANT
SYR 3ML LL SCALE MARK (SYRINGE) ×2 IMPLANT
TIBIAL BASE ROT PLAT SZ 3 KNEE (Knees) ×2 IMPLANT
TRAY FOLEY MTR SLVR 16FR STAT (SET/KITS/TRAYS/PACK) ×2 IMPLANT
WATER STERILE IRR 1000ML POUR (IV SOLUTION) ×4 IMPLANT
WRAP KNEE MAXI GEL POST OP (GAUZE/BANDAGES/DRESSINGS) ×2 IMPLANT
YANKAUER SUCT BULB TIP 10FT TU (MISCELLANEOUS) ×2 IMPLANT

## 2019-02-07 NOTE — Progress Notes (Signed)
Assisted Dr. Carswell Jackson with left, ultrasound guided, adductor canal block. Side rails up, monitors on throughout procedure. See vital signs in flow sheet. Tolerated Procedure well.  

## 2019-02-07 NOTE — Op Note (Signed)
NAME:  [name] [name]                      MEDICAL RECORD NO.:  [id number]                             FACILITY:  [facility]      PHYSICIAN:  Pietro Cassis. Alvan Dame, M.D.  DATE OF BIRTH:  1945/10/26      DATE OF PROCEDURE:  02/07/2019                                     OPERATIVE REPORT         PREOPERATIVE DIAGNOSIS:  Left knee osteoarthritis.      POSTOPERATIVE DIAGNOSIS:  Left knee osteoarthritis.      FINDINGS:  The patient was noted to have complete loss of cartilage and   bone-on-bone arthritis with associated osteophytes in the lateral compartment of   the knee with a significant synovitis and associated effusion.  The patient had failed months of conservative treatment including medications, injection therapy, activity modification.     PROCEDURE:  Left total knee replacement.      COMPONENTS USED:  DePuy Attune rotating platform posterior stabilized knee   system, a size 3N femur, 3 tibia, size 6 mm PS AOX insert, and 32 anatomic patellar   button.      SURGEON:  Pietro Cassis. Alvan Dame, M.D.      ASSISTANT:  Griffith Citron, PA-C.      ANESTHESIA:  Regional and Spinal.      SPECIMENS:  None.      COMPLICATION:  None.      DRAINS:  None.  EBL: <100      TOURNIQUET TIME:   Total Tourniquet Time Documented: Thigh (Left) - 28 minutes Total: Thigh (Left) - 28 minutes  .      The patient was stable to the recovery room.      INDICATION FOR PROCEDURE:  [name] [name] is a 73 y.o. female patient of   mine.  The patient had been seen, evaluated, and treated for months conservatively in the   office with medication, activity modification, and injections.  The patient had   radiographic changes of bone-on-bone arthritis with endplate sclerosis and osteophytes noted.  Based on the radiographic changes and failed conservative measures, the patient   decided to proceed with definitive treatment, total knee replacement.  Risks of infection, DVT, component failure, need for revision  surgery, neurovascular injury were reviewed in the office setting.  The postop course was reviewed stressing the efforts to maximize post-operative satisfaction and function.  Consent was obtained for benefit of pain   relief.      PROCEDURE IN DETAIL:  The patient was brought to the operative theater.   Once adequate anesthesia, preoperative antibiotics, 2 gm of Ancef,1 gm of Tranexamic Acid, and 10 mg of Decadron administered, the patient was positioned supine with a left thigh tourniquet placed.  The  left lower extremity was prepped and draped in sterile fashion.  A time-   out was performed identifying the patient, planned procedure, and the appropriate extremity.      The left lower extremity was placed in the [facility] leg holder.  The leg was   exsanguinated, tourniquet elevated to 250 mmHg.  A midline incision was   made followed by median parapatellar  arthrotomy.  Following initial   exposure, attention was first directed to the patella.  Precut   measurement was noted to be 22 mm.  I resected down to 13 mm and used a   32 anatomic patellar button to restore patellar height as well as cover the cut surface.      The lug holes were drilled and a metal shim was placed to protect the   patella from retractors and saw blade during the procedure.      At this point, attention was now directed to the femur.  The femoral   canal was opened with a drill, irrigated to try to prevent fat emboli.  An   intramedullary rod was passed at 3 degrees valgus, 9 mm of bone was   resected off the distal femur.  Following this resection, the tibia was   subluxated anteriorly.  Using the extramedullary guide, 3 mm of bone was resected off   the proximal medial tibia.  We confirmed the gap would be   stable medially and laterally with a size 5 spacer block as well as confirmed that the tibial cut was perpendicular in the coronal plane, checking with an alignment rod.      Once this was done, I sized the  femur to be a size 3 in the anterior-   posterior dimension, chose a narrow component based on medial and   lateral dimension.  The size 3 rotation block was then pinned in   position anterior referenced using the C-clamp to set rotation.  The   anterior, posterior, and  chamfer cuts were made without difficulty nor   notching making certain that I was along the anterior cortex to help   with flexion gap stability.      The final box cut was made off the lateral aspect of distal femur.      At this point, the tibia was sized to be a size 3.  The size 3 tray was   then pinned in position through the medial third of the tubercle,   drilled, and keel punched.  Trial reduction was now carried with a 3 femur,  3 tibia, a size 6 mm PS insert, and the 32 anatomic patella botton.  The knee was brought to full extension with good flexion stability with the patella   tracking through the trochlea without application of pressure.  Given   all these findings the trial components removed.  Final components were   opened and cement was mixed.  The knee was irrigated with normal saline solution and pulse lavage.  The synovial lining was   then injected with 30 cc of 0.25% Marcaine with epinephrine, 1 cc of Toradol and 30 cc of NS for a total of 61 cc.     Final implants were then cemented onto cleaned and dried cut surfaces of bone with the knee brought to extension with a size 6 mm PS trial insert.      Once the cement had fully cured, excess cement was removed   throughout the knee.  I confirmed that I was satisfied with the range of   motion and stability, and the final size 6 mm PS AOX insert was chosen.  It was   placed into the knee.      The tourniquet had been let down at 28 minutes.  No significant   hemostasis was required.  The extensor mechanism was then reapproximated using #1 Vicryl and #1 Stratafix sutures with  the knee   in flexion.  The   remaining wound was closed with 2-0 Vicryl and  running 4-0 Monocryl.   The knee was cleaned, dried, dressed sterilely using Dermabond and   Aquacel dressing.  The patient was then   brought to recovery room in stable condition, tolerating the procedure   well.   Please note that Physician Assistant, Griffith Citron, PA-C was present for the entirety of the case, and was utilized for pre-operative positioning, peri-operative retractor management, general facilitation of the procedure and for primary wound closure at the end of the case.              Pietro Cassis Alvan Dame, M.D.    02/07/2019 11:20 AM

## 2019-02-07 NOTE — Transfer of Care (Signed)
Immediate Anesthesia Transfer of Care Note  Patient: [name] [name]  Procedure(s) Performed: TOTAL KNEE ARTHROPLASTY (Left Knee)  Patient Location: PACU  Anesthesia Type:Spinal  Level of Consciousness: awake, alert  and oriented  Airway & Oxygen Therapy: Patient Spontanous Breathing and Patient connected to face mask oxygen  Post-op Assessment: Report given to RN and Post -op Vital signs reviewed and stable  Post vital signs: Reviewed and stable  Last Vitals:  Vitals Value Taken Time  BP    Temp    Pulse    Resp    SpO2      Last Pain:  Vitals:   02/07/19 0842  TempSrc: Oral         Complications: No apparent anesthesia complications

## 2019-02-07 NOTE — Care Plan (Signed)
Ortho Bundle Case Management Note  Patient Details  Name: [name] [name] MRN: [id number] Date of Birth: 04/15/1945  L TKA on 02-07-19 DCP:  Home with spouse.  MBR is on the 1st floor and has 1 ste.  DME:  No needs.  Has a RW, 3-in-1, and shower chair. PT: [facility].  PT eval scheduled on 02-10-19.                   DME Arranged:  N/A DME Agency:  NA  HH Arranged:  NA HH Agency:  NA  Additional Comments: Please contact me with any questions of if this plan should need to change.  Marianne Sofia, RN,CCM [facility]  [phone] 02/07/2019, 2:34 PM

## 2019-02-07 NOTE — Discharge Instructions (Addendum)
INSTRUCTIONS AFTER JOINT REPLACEMENT  ° °o Remove items at home which could result in a fall. This includes throw rugs or furniture in walking pathways °o ICE to the affected joint every three hours while awake for 30 minutes at a time, for at least the first 3-5 days, and then as needed for pain and swelling.  Continue to use ice for pain and swelling. You may notice swelling that will progress down to the foot and ankle.  This is normal after surgery.  Elevate your leg when you are not up walking on it.   °o Continue to use the breathing machine you got in the hospital (incentive spirometer) which will help keep your temperature down.  It is common for your temperature to cycle up and down following surgery, especially at night when you are not up moving around and exerting yourself.  The breathing machine keeps your lungs expanded and your temperature down. ° ° °DIET:  As you were doing prior to hospitalization, we recommend a well-balanced diet. ° °DRESSING / WOUND CARE / SHOWERING ° °Keep the surgical dressing until follow up.  The dressing is water proof, so you can shower without any extra covering.  IF THE DRESSING FALLS OFF or the wound gets wet inside, change the dressing with sterile gauze.  Please use good hand washing techniques before changing the dressing.  Do not use any lotions or creams on the incision until instructed by your surgeon.   ° °ACTIVITY ° °o Increase activity slowly as tolerated, but follow the weight bearing instructions below.   °o No driving for 6 weeks or until further direction given by your physician.  You cannot drive while taking narcotics.  °o No lifting or carrying greater than 10 lbs. until further directed by your surgeon. °o Avoid periods of inactivity such as sitting longer than an hour when not asleep. This helps prevent blood clots.  °o You may return to work once you are authorized by your doctor.  ° ° ° °WEIGHT BEARING  ° °Weight bearing as tolerated with assist  device (walker, cane, etc) as directed, use it as long as suggested by your surgeon or therapist, typically at least 4-6 weeks. ° ° °EXERCISES ° °Results after joint replacement surgery are often greatly improved when you follow the exercise, range of motion and muscle strengthening exercises prescribed by your doctor. Safety measures are also important to protect the joint from further injury. Any time any of these exercises cause you to have increased pain or swelling, decrease what you are doing until you are comfortable again and then slowly increase them. If you have problems or questions, call your caregiver or physical therapist for advice.  ° °Rehabilitation is important following a joint replacement. After just a few days of immobilization, the muscles of the leg can become weakened and shrink (atrophy).  These exercises are designed to build up the tone and strength of the thigh and leg muscles and to improve motion. Often times heat used for twenty to thirty minutes before working out will loosen up your tissues and help with improving the range of motion but do not use heat for the first two weeks following surgery (sometimes heat can increase post-operative swelling).  ° °These exercises can be done on a training (exercise) mat, on the floor, on a table or on a bed. Use whatever works the best and is most comfortable for you.    Use music or television while you are exercising so that   the exercises are a pleasant break in your day. This will make your life better with the exercises acting as a break in your routine that you can look forward to.   Perform all exercises about fifteen times, three times per day or as directed.  You should exercise both the operative leg and the other leg as well. ° °Exercises include: °  °• Quad Sets - Tighten up the muscle on the front of the thigh (Quad) and hold for 5-10 seconds.   °• Straight Leg Raises - With your knee straight (if you were given a brace, keep it on),  lift the leg to 60 degrees, hold for 3 seconds, and slowly lower the leg.  Perform this exercise against resistance later as your leg gets stronger.  °• Leg Slides: Lying on your back, slowly slide your foot toward your buttocks, bending your knee up off the floor (only go as far as is comfortable). Then slowly slide your foot back down until your leg is flat on the floor again.  °• Angel Wings: Lying on your back spread your legs to the side as far apart as you can without causing discomfort.  °• Hamstring Strength:  Lying on your back, push your heel against the floor with your leg straight by tightening up the muscles of your buttocks.  Repeat, but this time bend your knee to a comfortable angle, and push your heel against the floor.  You may put a pillow under the heel to make it more comfortable if necessary.  ° °A rehabilitation program following joint replacement surgery can speed recovery and prevent re-injury in the future due to weakened muscles. Contact your doctor or a physical therapist for more information on knee rehabilitation.  ° ° °CONSTIPATION ° °Constipation is defined medically as fewer than three stools per week and severe constipation as less than one stool per week.  Even if you have a regular bowel pattern at home, your normal regimen is likely to be disrupted due to multiple reasons following surgery.  Combination of anesthesia, postoperative narcotics, change in appetite and fluid intake all can affect your bowels.  ° °YOU MUST use at least one of the following options; they are listed in order of increasing strength to get the job done.  They are all available over the counter, and you may need to use some, POSSIBLY even all of these options:   ° °Drink plenty of fluids (prune juice may be helpful) and high fiber foods °Colace 100 mg by mouth twice a day  °Senokot for constipation as directed and as needed Dulcolax (bisacodyl), take with full glass of water  °Miralax (polyethylene glycol)  once or twice a day as needed. ° °If you have tried all these things and are unable to have a bowel movement in the first 3-4 days after surgery call either your surgeon or your primary doctor.   ° °If you experience loose stools or diarrhea, hold the medications until you stool forms back up.  If your symptoms do not get better within 1 week or if they get worse, check with your doctor.  If you experience "the worst abdominal pain ever" or develop nausea or vomiting, please contact the office immediately for further recommendations for treatment. ° ° °ITCHING:  If you experience itching with your medications, try taking only a single pain pill, or even half a pain pill at a time.  You can also use Benadryl over the counter for itching or also to   help with sleep.  ° °TED HOSE STOCKINGS:  Use stockings on both legs until for at least 2 weeks or as directed by physician office. They may be removed at night for sleeping. ° °MEDICATIONS:  See your medication summary on the “After Visit Summary” that nursing will review with you.  You may have some home medications which will be placed on hold until you complete the course of blood thinner medication.  It is important for you to complete the blood thinner medication as prescribed. ° °PRECAUTIONS:  If you experience chest pain or shortness of breath - call 911 immediately for transfer to the hospital emergency department.  ° °If you develop a fever greater that 101 F, purulent drainage from wound, increased redness or drainage from wound, foul odor from the wound/dressing, or calf pain - CONTACT YOUR SURGEON.   °                                                °FOLLOW-UP APPOINTMENTS:  If you do not already have a post-op appointment, please call the office for an appointment to be seen by your surgeon.  Guidelines for how soon to be seen are listed in your “After Visit Summary”, but are typically between 1-4 weeks after surgery. ° °OTHER INSTRUCTIONS:  ° °Knee  Replacement:  Do not place pillow under knee, focus on keeping the knee straight while resting.  ° °MAKE SURE YOU:  °• Understand these instructions.  °• Get help right away if you are not doing well or get worse.  ° ° °Thank you for letting us be a part of your medical care team.  It is a privilege we respect greatly.  We hope these instructions will help you stay on track for a fast and full recovery!  °  ° °Information on my medicine - XARELTO® (Rivaroxaban) ° °Why was Xarelto® prescribed for you? °Xarelto® was prescribed for you to reduce the risk of blood clots forming after orthopedic surgery. The medical term for these abnormal blood clots is venous thromboembolism (VTE). ° °What do you need to know about xarelto® ? °Take your Xarelto® ONCE DAILY at the same time every day. °You may take it either with or without food. ° °If you have difficulty swallowing the tablet whole, you may crush it and mix in applesauce just prior to taking your dose. ° °Take Xarelto® exactly as prescribed by your doctor and DO NOT stop taking Xarelto® without talking to the doctor who prescribed the medication.  Stopping without other VTE prevention medication to take the place of Xarelto® may increase your risk of developing a clot. ° °After discharge, you should have regular check-up appointments with your healthcare provider that is prescribing your Xarelto®.   ° °What do you do if you miss a dose? °If you miss a dose, take it as soon as you remember on the same day then continue your regularly scheduled once daily regimen the next day. Do not take two doses of Xarelto® on the same day.  ° °Important Safety Information °A possible side effect of Xarelto® is bleeding. You should call your healthcare provider right away if you experience any of the following: °? Bleeding from an injury or your nose that does not stop. °? Unusual colored urine (red or dark brown) or unusual colored stools (red or black). °? Unusual   bruising for  unknown reasons. °? A serious fall or if you hit your head (even if there is no bleeding). ° °Some medicines may interact with Xarelto® and might increase your risk of bleeding while on Xarelto®. To help avoid this, consult your healthcare provider or pharmacist prior to using any new prescription or non-prescription medications, including herbals, vitamins, non-steroidal anti-inflammatory drugs (NSAIDs) and supplements. ° °This website has more information on Xarelto®: www.xarelto.com. ° ° °

## 2019-02-07 NOTE — Evaluation (Signed)
Physical Therapy Evaluation Patient Details Name: [name] [name] MRN: [id number] DOB: 04-08-1945 Today's Date: 02/07/2019   History of Present Illness  L TKA; PMH of fibromyalgia, DM, R THA 2015  Clinical Impression  Pt is s/p TKA resulting in the deficits listed below (see PT Problem List). Min A bed mobility, mod A sit to stand, min A to pivot to recliner with RW. Did not attempt ambulation as motor function in LLE has not fully return, suspect adductor block not fully worn off. LLE buckled in standing and pt unable to actively extend knee in sitting. Good progress expected once block wears off.  Pt will benefit from skilled PT to increase their independence and safety with mobility to allow discharge to the venue listed below.      Follow Up Recommendations Follow surgeon's recommendation for DC plan and follow-up therapies    Equipment Recommendations  None recommended by PT    Recommendations for Other Services       Precautions / Restrictions Precautions Precautions: Fall Restrictions Weight Bearing Restrictions: No Other Position/Activity Restrictions: WBAT      Mobility  Bed Mobility Overal bed mobility: Needs Assistance Bed Mobility: Supine to Sit     Supine to sit: Min assist     General bed mobility comments: assist to raise trunk  Transfers Overall transfer level: Needs assistance Equipment used: Rolling walker (2 wheeled) Transfers: Sit to/from [facility] Sit to Stand: Mod assist Stand pivot transfers: Min assist       General transfer comment: assist to rise, VCs hand placement, LLE buckled with weight bearing so did not attempt ambulation  Ambulation/Gait                Stairs            Wheelchair Mobility    Modified Rankin (Stroke Patients Only)       Balance Overall balance assessment: Needs assistance   Sitting balance-Leahy Scale: Good     Standing balance support: Bilateral upper extremity  supported Standing balance-Leahy Scale: Poor Standing balance comment: relies on BUE support                             Pertinent Vitals/Pain Pain Assessment: 0-10 Pain Score: 3  Pain Location: L knee Pain Descriptors / Indicators: Sore Pain Intervention(s): Limited activity within patient's tolerance;Monitored during session;Premedicated before session;Ice applied    Home Living Family/patient expects to be discharged to:: Private residence Living Arrangements: Spouse/significant other     Home Access: Stairs to enter Entrance Stairs-Rails: Psychiatric nurse of Steps: 5 Home Layout: Two level;Able to live on main level with bedroom/bathroom Home Equipment: Gilford Rile - 2 wheels;Cane - single point      Prior Function Level of Independence: Independent with assistive device(s)         Comments: uses RW vs cane prn     Hand Dominance        Extremity/Trunk Assessment   Upper Extremity Assessment Upper Extremity Assessment: Overall WFL for tasks assessed    Lower Extremity Assessment Lower Extremity Assessment: LLE deficits/detail LLE Deficits / Details: poor quad contraction, unable to initiate knee extension in sitting, suspect adductor block hasn't worn off yet, SLR +1/5, knee AAROM 10-60* LLE Sensation: WNL    Cervical / Trunk Assessment Cervical / Trunk Assessment: Normal  Communication   Communication: HOH  Cognition Arousal/Alertness: Awake/alert Behavior During Therapy: WFL for tasks assessed/performed Overall Cognitive Status: Within Functional  Limits for tasks assessed                                        General Comments      Exercises Total Joint Exercises Ankle Circles/Pumps: AROM;Both;10 reps;Supine Quad Sets: AROM;Left;5 reps;Supine Long Arc Quad: AAROM;Left;5 reps;Seated   Assessment/Plan    PT Assessment Patient needs continued PT services  PT Problem List Decreased strength;Decreased  range of motion;Decreased activity tolerance;Pain;Decreased knowledge of use of DME;Decreased mobility       PT Treatment Interventions Gait training;Functional mobility training;Stair training;Therapeutic activities;Therapeutic exercise;DME instruction;Patient/family education    PT Goals (Current goals can be found in the Care Plan section)  Acute Rehab PT Goals Patient Stated Goal: be able to walk and exercise PT Goal Formulation: With patient Time For Goal Achievement: 02/14/19 Potential to Achieve Goals: Good    Frequency 7X/week   Barriers to discharge        Co-evaluation               AM-PAC PT "6 Clicks" Mobility  Outcome Measure Help needed turning from your back to your side while in a flat bed without using bedrails?: A Little Help needed moving from lying on your back to sitting on the side of a flat bed without using bedrails?: A Little Help needed moving to and from a bed to a chair (including a wheelchair)?: A Little Help needed standing up from a chair using your arms (e.g., wheelchair or bedside chair)?: A Lot Help needed to walk in hospital room?: A Lot Help needed climbing 3-5 steps with a railing? : A Lot 6 Click Score: 15    End of Session Equipment Utilized During Treatment: Gait belt Activity Tolerance: Patient tolerated treatment well Patient left: in chair Nurse Communication: Mobility status(LLE buckling 2* block not fully worn off) PT Visit Diagnosis: Muscle weakness (generalized) (M62.81);Difficulty in walking, not elsewhere classified (R26.2);Pain Pain - Right/Left: Left Pain - part of body: Knee    Time: [id number] PT Time Calculation (min) (ACUTE ONLY): 23 min   Charges:   PT Evaluation $PT Eval Low Complexity: 1 Low PT Treatments $Therapeutic Activity: 8-22 mins        Blondell Reveal Kistler PT 02/07/2019  Acute Rehabilitation Services Pager [phone] Office [phone]

## 2019-02-07 NOTE — Interval H&P Note (Signed)
History and Physical Interval Note:  02/07/2019 8:56 AM  [name] [name]  has presented today for surgery, with the diagnosis of Left knee osteoarthritis.  The various methods of treatment have been discussed with the patient and family. After consideration of risks, benefits and other options for treatment, the patient has consented to  Procedure(s) with comments: TOTAL KNEE ARTHROPLASTY (Left) - 70 mins as a surgical intervention.  The patient's history has been reviewed, patient examined, no change in status, stable for surgery.  I have reviewed the patient's chart and labs.  Questions were answered to the patient's satisfaction.     [name]

## 2019-02-07 NOTE — Anesthesia Postprocedure Evaluation (Signed)
Anesthesia Post Note  Patient: [name] [name]  Procedure(s) Performed: TOTAL KNEE ARTHROPLASTY (Left Knee)     Patient location during evaluation: PACU Anesthesia Type: Spinal Level of consciousness: awake and alert, patient cooperative and oriented Pain management: pain level controlled Vital Signs Assessment: post-procedure vital signs reviewed and stable Respiratory status: spontaneous breathing, nonlabored ventilation and respiratory function stable Postop Assessment: spinal receding and no apparent nausea or vomiting Anesthetic complications: no    Last Vitals:  Vitals:   02/07/19 1501 02/07/19 1605  BP: 119/64 109/69  Pulse: 91 91  Resp: 14 11  Temp: 36.6 C 36.7 C  SpO2: 98% 99%    Last Pain:  Vitals:   02/07/19 1628  TempSrc:   PainSc: 7                  Thadius Smisek,E. Winter Jocelyn

## 2019-02-07 NOTE — Plan of Care (Signed)
Plan of care 

## 2019-02-07 NOTE — Anesthesia Procedure Notes (Signed)
Anesthesia Regional Block: Adductor canal block   Pre-Anesthetic Checklist: ,, timeout performed, Correct Patient, Correct Site, Correct Laterality, Correct Procedure, Correct Position, site marked, Risks and benefits discussed,  Surgical consent,  Pre-op evaluation,  At surgeon's request and post-op pain management  Laterality: Left  Prep: chloraprep       Needles:  Injection technique: Single-shot  Needle Type: Echogenic Needle     Needle Length: 9cm  Needle Gauge: 21     Additional Needles:   Procedures:,,,, ultrasound used (permanent image in chart),,,,  Narrative:  Start time: 02/07/2019 9:34 AM End time: 02/07/2019 9:40 AM Injection made incrementally with aspirations every 5 mL.  Performed by: Personally  Anesthesiologist: Annye Asa, MD  Additional Notes: Pt identified in Holding room.  Monitors applied. Working IV access confirmed. Sterile prep L thigh.  #21ga ECHOgenic needle into adductor cnal with US guidance.  20cc 0.75% Ropivacaine injected incrementally after negative test dose.  Patient asymptomatic, VSS, no heme aspirated, tolerated well.  Jenita Seashore, MD

## 2019-02-07 NOTE — Anesthesia Preprocedure Evaluation (Addendum)
Anesthesia Evaluation  Patient identified by MRN, date of birth, ID band Patient awake    Reviewed: Allergy & Precautions, NPO status , Patient's Chart, lab work & pertinent test results  History of Anesthesia Complications Negative for: history of anesthetic complications  Airway Mallampati: II  TM Distance: >3 FB Neck ROM: Full    Dental  (+) Implants, Dental Advisory Given   Pulmonary neg pulmonary ROS,  02/03/2019 SARS CoV2 NEG   breath sounds clear to auscultation       Cardiovascular hypertension, Pt. on medications (-) angina Rhythm:Regular Rate:Normal  09/25/2018 Cath: No obstructive coronary disease identified   Neuro/Psych  Headaches, Anxiety Depression Bipolar Disorder    GI/Hepatic GERD  Poorly Controlled,(+)     substance abuse  alcohol use,   Endo/Other  diabetes, Insulin DependentHypothyroidism SLE  Renal/GU negative Renal ROS     Musculoskeletal  (+) Arthritis , Fibromyalgia -  Abdominal (+) + obese,   Peds  Hematology negative hematology ROS (+)   Anesthesia Other Findings   Reproductive/Obstetrics                            Anesthesia Physical Anesthesia Plan  ASA: III  Anesthesia Plan: Spinal   Post-op Pain Management:  Regional for Post-op pain   Induction:   PONV Risk Score and Plan: 2 and Ondansetron, Dexamethasone and Treatment may vary due to age or medical condition  Airway Management Planned: Natural Airway and Simple Face Mask  Additional Equipment: None  Intra-op Plan:   Post-operative Plan:   Informed Consent: I have reviewed the patients History and Physical, chart, labs and discussed the procedure including the risks, benefits and alternatives for the proposed anesthesia with the patient or authorized representative who has indicated his/her understanding and acceptance.     Dental advisory given  Plan Discussed with: CRNA and  Surgeon  Anesthesia Plan Comments: (Plan routine monitors, SAB with adductor canal block for post op analgesia)       Anesthesia Quick Evaluation

## 2019-02-07 NOTE — Anesthesia Procedure Notes (Signed)
Spinal  Patient location during procedure: OR End time: 02/07/2019 10:03 AM Staffing Anesthesiologist: Annye Asa, MD Performed: anesthesiologist  Preanesthetic Checklist Completed: patient identified, site marked, surgical consent, pre-op evaluation, timeout performed, IV checked, risks and benefits discussed and monitors and equipment checked Spinal Block Patient position: sitting Prep: site prepped and draped and DuraPrep Patient monitoring: blood pressure, continuous pulse ox, cardiac monitor and heart rate Approach: midline Location: L2-3 Injection technique: single-shot Needle Needle type: Pencan  Needle gauge: 24 G Needle length: 9 cm Additional Notes Pt identified in Operating room.  Monitors applied. Working IV access confirmed. Sterile prep, drape lumbar spine.  1% lido local L 3,4. CRNA #24 ga Pencan, all attempts os, repeat local L 2,3 and #24ga Pencan into clear CSF L 2,3.  12.5 mg 0.75% Bupivacaine with dextrose injected with asp CSF beginning and end of injection.  Patient asymptomatic, VSS, no heme aspirated, tolerated well.  Jenita Seashore, MD

## 2019-02-08 DIAGNOSIS — H9193 Unspecified hearing loss, bilateral: Secondary | ICD-10-CM | POA: Diagnosis not present

## 2019-02-08 DIAGNOSIS — E1142 Type 2 diabetes mellitus with diabetic polyneuropathy: Secondary | ICD-10-CM | POA: Diagnosis not present

## 2019-02-08 DIAGNOSIS — I10 Essential (primary) hypertension: Secondary | ICD-10-CM | POA: Diagnosis not present

## 2019-02-08 DIAGNOSIS — F419 Anxiety disorder, unspecified: Secondary | ICD-10-CM | POA: Diagnosis not present

## 2019-02-08 DIAGNOSIS — M1712 Unilateral primary osteoarthritis, left knee: Secondary | ICD-10-CM | POA: Diagnosis not present

## 2019-02-08 DIAGNOSIS — E663 Overweight: Secondary | ICD-10-CM | POA: Diagnosis present

## 2019-02-08 DIAGNOSIS — F319 Bipolar disorder, unspecified: Secondary | ICD-10-CM | POA: Diagnosis not present

## 2019-02-08 LAB — GLUCOSE, CAPILLARY
Glucose-Capillary: 127 mg/dL — ABNORMAL HIGH (ref 70–99)
Glucose-Capillary: 167 mg/dL — ABNORMAL HIGH (ref 70–99)

## 2019-02-08 LAB — CBC
HCT: 36.2 % (ref 36.0–46.0)
Hemoglobin: 11 g/dL — ABNORMAL LOW (ref 12.0–15.0)
MCH: 28.8 pg (ref 26.0–34.0)
MCHC: 30.4 g/dL (ref 30.0–36.0)
MCV: 94.8 fL (ref 80.0–100.0)
Platelets: 198 10*3/uL (ref 150–400)
RBC: 3.82 MIL/uL — ABNORMAL LOW (ref 3.87–5.11)
RDW: 13.7 % (ref 11.5–15.5)
WBC: 12.4 10*3/uL — ABNORMAL HIGH (ref 4.0–10.5)
nRBC: 0 % (ref 0.0–0.2)

## 2019-02-08 LAB — BASIC METABOLIC PANEL
Anion gap: 9 (ref 5–15)
BUN: 14 mg/dL (ref 8–23)
CO2: 23 mmol/L (ref 22–32)
Calcium: 9.2 mg/dL (ref 8.9–10.3)
Chloride: 107 mmol/L (ref 98–111)
Creatinine, Ser: 0.95 mg/dL (ref 0.44–1.00)
GFR calc Af Amer: >60 mL/min (ref >60)
GFR calc non Af Amer: >60 mL/min (ref >60)
Glucose, Bld: 130 mg/dL — ABNORMAL HIGH (ref 70–99)
Potassium: 4.2 mmol/L (ref 3.5–5.1)
Sodium: 139 mmol/L (ref 135–145)

## 2019-02-08 MED ORDER — METHOCARBAMOL 500 MG PO TABS: 500.0000 mg | ORAL_TABLET | Freq: Four times a day (QID) | ORAL | 0 refills | Status: DC | PRN

## 2019-02-08 MED ORDER — RIVAROXABAN 10 MG PO TABS: 10.0000 mg | ORAL_TABLET | Freq: Every day | ORAL | 0 refills | Status: DC

## 2019-02-08 MED ORDER — FERROUS SULFATE 325 (65 FE) MG PO TABS: 325.0000 mg | ORAL_TABLET | Freq: Three times a day (TID) | ORAL | 0 refills | Status: DC

## 2019-02-08 MED ORDER — DOCUSATE SODIUM 100 MG PO CAPS: 100.0000 mg | ORAL_CAPSULE | Freq: Two times a day (BID) | ORAL | 0 refills | Status: DC

## 2019-02-08 MED ORDER — OXYCODONE HCL 5 MG PO TABS: 5.0000 mg | ORAL_TABLET | ORAL | 0 refills | Status: DC | PRN

## 2019-02-08 MED ORDER — POLYETHYLENE GLYCOL 3350 17 G PO PACK: 17.0000 g | PACK | Freq: Two times a day (BID) | ORAL | 0 refills | Status: DC

## 2019-02-08 NOTE — Progress Notes (Signed)
Physical Therapy Treatment Patient Details Name: [name] [name] MRN: [id number] DOB: 1945-03-25 Today's Date: 02/08/2019    History of Present Illness L TKA; PMH of fibromyalgia, DM, R THA 2015    PT Comments    Pt and spouse educated on applying and using KI.  Pt ambulated short distance as she fatigues quickly and practiced one step. Spouse present and observed session.  Pt also provided with HEP handout.  Pt agreeable to have spouse assist with gait belt for mobility at home for safety.  Both pt and spouse feel confident with safely mobilizing at home. Pt and spouse wish for pt to d/c home today.   Follow Up Recommendations  Follow surgeon's recommendation for DC plan and follow-up therapies     Equipment Recommendations  None recommended by PT    Recommendations for Other Services       Precautions / Restrictions Precautions Precautions: Fall;Knee Precaution Comments: pt and spouse educated on applying KI and when to use Required Braces or Orthoses: Knee Immobilizer - Left Restrictions Other Position/Activity Restrictions: WBAT    Mobility  Bed Mobility               General bed mobility comments: pt up in recliner  Transfers Overall transfer level: Needs assistance Equipment used: Rolling walker (2 wheeled) Transfers: Sit to/from Stand Sit to Stand: Min guard         General transfer comment: verbal cues for UE and LE positioning; performed twice  Ambulation/Gait Ambulation/Gait assistance: Min guard;Min assist Gait Distance (Feet): 16 Feet Assistive device: Rolling walker (2 wheeled) Gait Pattern/deviations: Step-to pattern;Decreased stance time - left;Antalgic Gait velocity: decr   General Gait Details: verbal cues for sequence, step length, RW positioning, assist for weakness and and occasional unsteadiness; KI on with mobility;  pt fatigued quickly and requested recliner rest break before practicing one step; performed 16'x1 and then  8'x1   Stairs Stairs: Yes Stairs assistance: Min assist Stair Management: Step to pattern;Backwards;With walker Number of Stairs: 1 General stair comments: pt reports one step into home; step by step instructions for safety and technique; spouse observed; pt declined practicing more then once   Wheelchair Mobility    Modified Rankin (Stroke Patients Only)       Balance                                            Cognition Arousal/Alertness: Awake/alert Behavior During Therapy: WFL for tasks assessed/performed Overall Cognitive Status: Within Functional Limits for tasks assessed                                        Exercises     General Comments        Pertinent Vitals/Pain Pain Assessment: 0-10 Pain Score: 3  Pain Location: L knee Pain Descriptors / Indicators: Sore;Aching Pain Intervention(s): Monitored during session;Repositioned    Home Living                      Prior Function            PT Goals (current goals can now be found in the care plan section) Progress towards PT goals: Progressing toward goals    Frequency    7X/week      PT Plan  Current plan remains appropriate    Co-evaluation              AM-PAC PT "6 Clicks" Mobility   Outcome Measure  Help needed turning from your back to your side while in a flat bed without using bedrails?: A Little Help needed moving from lying on your back to sitting on the side of a flat bed without using bedrails?: A Little Help needed moving to and from a bed to a chair (including a wheelchair)?: A Little Help needed standing up from a chair using your arms (e.g., wheelchair or bedside chair)?: A Little Help needed to walk in hospital room?: A Little Help needed climbing 3-5 steps with a railing? : A Little 6 Click Score: 18    End of Session Equipment Utilized During Treatment: Gait belt;Left knee immobilizer Activity Tolerance: Patient limited  by fatigue Patient left: in chair;with call bell/phone within reach;with family/visitor present Nurse Communication: Mobility status PT Visit Diagnosis: Muscle weakness (generalized) (M62.81);Difficulty in walking, not elsewhere classified (R26.2)     Time: [id number] PT Time Calculation (min) (ACUTE ONLY): 19 min  Charges:  $Gait Training: 8-22 mins                    Carmelia Bake, PT, DPT Acute Rehabilitation Services Office: [phone] Pager: [phone]    [name] [name] 02/08/2019, 2:13 PM

## 2019-02-08 NOTE — Progress Notes (Signed)
Patient discharged to home w/ family. Given all belongings, instructions. Verbalized understanding of instructions. Escorted to pov via w/c. 

## 2019-02-08 NOTE — Progress Notes (Signed)
Physical Therapy Treatment Patient Details Name: [name] [name] MRN: [id number] DOB: 11-27-1945 Today's Date: 02/08/2019    History of Present Illness L TKA; PMH of fibromyalgia, DM, R THA 2015    PT Comments    Pt performed LE exercises and then ambulated short distance.  Pt with limited ambulation due to fatigue and LE buckling.     Follow Up Recommendations  Follow surgeon's recommendation for DC plan and follow-up therapies     Equipment Recommendations  None recommended by PT    Recommendations for Other Services       Precautions / Restrictions Precautions Precautions: Fall;Knee Restrictions Other Position/Activity Restrictions: WBAT    Mobility  Bed Mobility               General bed mobility comments: pt up in recliner (reports legs too weak to get back to bed last night)  Transfers Overall transfer level: Needs assistance Equipment used: Rolling walker (2 wheeled) Transfers: Sit to/from Stand Sit to Stand: Min assist         General transfer comment: verbal cues for UE and LE positioning; assist to rise and steady; pt fearful of buckling and falling so provided step by step instructions  Ambulation/Gait Ambulation/Gait assistance: Min assist Gait Distance (Feet): 8 Feet Assistive device: Rolling walker (2 wheeled) Gait Pattern/deviations: Step-to pattern;Decreased stance time - left;Antalgic     General Gait Details: verbal cues for sequence, step length, RW positioning, assist for weakness and occasional buckling of LE (RN to order KI); pt only able to tolerate a few feet before fatiguing and recliner brought to pt   Stairs             Wheelchair Mobility    Modified Rankin (Stroke Patients Only)       Balance                                            Cognition Arousal/Alertness: Awake/alert Behavior During Therapy: WFL for tasks assessed/performed Overall Cognitive Status: Within Functional Limits for  tasks assessed                                        Exercises Total Joint Exercises Ankle Circles/Pumps: AROM;Both;10 reps;Supine Quad Sets: AROM;Left;Supine;10 reps Heel Slides: AAROM;Left;10 reps Hip ABduction/ADduction: AAROM;Left;10 reps Straight Leg Raises: AAROM;Left;10 reps    General Comments        Pertinent Vitals/Pain Pain Assessment: 0-10 Pain Score: 4  Pain Location: L knee Pain Descriptors / Indicators: Sore;Aching Pain Intervention(s): Repositioned;Monitored during session;Premedicated before session    Home Living                      Prior Function            PT Goals (current goals can now be found in the care plan section) Progress towards PT goals: Progressing toward goals    Frequency    7X/week      PT Plan Current plan remains appropriate    Co-evaluation              AM-PAC PT "6 Clicks" Mobility   Outcome Measure  Help needed turning from your back to your side while in a flat bed without using bedrails?: A Little Help needed moving from lying  on your back to sitting on the side of a flat bed without using bedrails?: A Little Help needed moving to and from a bed to a chair (including a wheelchair)?: A Little Help needed standing up from a chair using your arms (e.g., wheelchair or bedside chair)?: A Little Help needed to walk in hospital room?: A Lot Help needed climbing 3-5 steps with a railing? : A Lot 6 Click Score: 16    End of Session Equipment Utilized During Treatment: Gait belt Activity Tolerance: Patient limited by fatigue Patient left: in chair;with call bell/phone within reach Nurse Communication: Mobility status PT Visit Diagnosis: Muscle weakness (generalized) (M62.81);Difficulty in walking, not elsewhere classified (R26.2)     Time: [id number] PT Time Calculation (min) (ACUTE ONLY): 20 min  Charges:  $Therapeutic Exercise: 8-22 mins                     Carmelia Bake, PT,  DPT Acute Rehabilitation Services Office: [phone] Pager: [phone]  Trena Platt 02/08/2019, 12:21 PM

## 2019-02-08 NOTE — Progress Notes (Addendum)
     Subjective: 1 Day Post-Op Procedure(s) (LRB): TOTAL KNEE ARTHROPLASTY (Left)   Patient reports pain as mild, pain controlled. No events throughout the night.  She states that yesterday she had an incident of buckling when getting up as well as feeling weakness in the leg.  Dr. Alvan Dame discussed the procedure, findings and expectations moving forward.  Patient will be discharged home, if they do well with PT.  They will follow up in the clinic in 2 weeks. They know to call with any questions or concerns.    Patient's anticipated LOS is less than 2 midnights, meeting these requirements: - Lives within 1 hour of care - Has a competent adult at home to recover with post-op recover - NO history of  - Chronic pain requiring opiods  - Coronary Artery Disease  - Heart failure  - Heart attack  - Stroke  - DVT/VTE  - Cardiac arrhythmia  - Respiratory Failure/COPD  - Renal failure  - Anemia  - Advanced Liver disease    Objective:   VITALS:   Vitals:   02/08/19 0055 02/08/19 0500  BP: 124/69 109/62  Pulse: 81 80  Resp: 16 16  Temp: 97.8 F (36.6 C) 97.8 F (36.6 C)  SpO2: 97% 99%    Dorsiflexion/Plantar flexion intact Incision: dressing C/D/I No cellulitis present Compartment soft  LABS Recent Labs    02/08/19 0227  HGB 11.0*  HCT 36.2  WBC 12.4*  PLT 198    Recent Labs    02/08/19 0227  NA 139  K 4.2  BUN 14  CREATININE 0.95  GLUCOSE 130*     Assessment/Plan: 1 Day Post-Op Procedure(s) (LRB): TOTAL KNEE ARTHROPLASTY (Left) Foley cath d/c'ed Advance diet Up with therapy D/C IV fluids Discharge home Follow up in 2 weeks at [facility] Follow up with OLIN,Treyshon Buchanon D in 2 weeks.  Contact information:  [facility] [address], [address] [id number]    Overweight (BMI 25-29.9) Estimated body mass index is 29.27 kg/m as calculated from the following:   Height as of this encounter: 5\' 2"  (1.575 m).   Weight  as of this encounter: 72.6 kg. Patient also counseled that weight may inhibit the healing process Patient counseled that losing weight will help with future health issues         West Pugh. Verneal Wiers   PAC  02/08/2019, 8:36 AM

## 2019-02-08 NOTE — Plan of Care (Signed)
Plan of care reviewed and discussed with the patient. 

## 2019-02-09 ENCOUNTER — Encounter (HOSPITAL_COMMUNITY): Payer: Self-pay | Admitting: Orthopedic Surgery

## 2019-02-09 NOTE — Discharge Summary (Signed)
Physician Discharge Summary  Patient ID: [name] [name] MRN: [id number] DOB/AGE: [birth year] 71 y.o.  Admit date: 02/07/2019 Discharge date: 02/08/2019   Procedures:  Procedure(s) (LRB): TOTAL KNEE ARTHROPLASTY (Left)  Attending Physician:  Dr. Paralee Cancel   Admission Diagnoses:   Left knee primary OA / pain  Discharge Diagnoses:  Principal Problem:   S/P left TKA Active Problems:   Overweight (BMI 25.0-29.9)  Past Medical History:  Diagnosis Date  . Alcohol abuse   . Anxiety    takes Valium daily as needed and Ativan daily  . Bilateral hearing loss   . Bipolar 1 disorder ([place])    takes Lithium nightly and Synthroid daily  . Chronic back pain    DDD; "all over" (09/14/2017)  . Colitis, ischemic ([place]) 2012  . Confusion    r/t meds  . Depression    takes Prozac daily and Bupspirone   . Diverticulosis   . Dyslipidemia    takes Crestor daily  . Fibromyalgia   . Headache    "weekly" (09/14/2017)  . Hepatic steatosis 06/18/12   severe  . Hyperlipidemia   . Hypertension   . Hypothyroidism   . IBS (irritable bowel syndrome)   . Ischemic colitis ([place])   . Joint pain   . Joint swelling   . Lupus erythematosus tumidus    tumid-skin  . Migraine    "1-2/yr; maybe" (09/14/2017)  . Numbness    in right foot  . Osteoarthritis    "all over" (09/14/2017)  . Osteoarthritis cervical spine   . Osteoarthritis of hand    bilateral  . Pneumonia    "walking pneumonia several times; long time since the last time" (09/14/2017)  . Restless leg syndrome    takes Requip nightly  . Sciatica   . Type II diabetes mellitus ([place])   . Urinary frequency   . Urinary leakage   . Urinary urgency   . Urinary, incontinence, stress female   . Walking pneumonia    last time more than 39yrs ago    HPI:     [name] [name], 73 y.o. female, has a history of pain and functional disability in the left knee due to arthritis and has failed non-surgical conservative treatments for greater than 12  weeks to include NSAID's and/or analgesics, use of assistive devices and activity modification.  Onset of symptoms was gradual, starting 1+ years ago with gradually worsening course since that time. The patient noted prior procedures on the knee to include  arthroscopy and menisectomy on the left knee(s).  Patient currently rates pain in the left knee(s) at 8 out of 10 with activity. Patient has night pain, worsening of pain with activity and weight bearing, pain that interferes with activities of daily living, pain with passive range of motion, crepitus and joint swelling.  Patient has evidence of periarticular osteophytes and joint space narrowing by imaging studies.  There is no active infection.  Risks, benefits and expectations were discussed with the patient.  Risks including but not limited to the risk of anesthesia, blood clots, nerve damage, blood vessel damage, failure of the prosthesis, infection and up to and including death.  Patient understand the risks, benefits and expectations and wishes to proceed with surgery.  PCP: London Pepper, MD   Discharged Condition: good  Hospital Course:  Patient underwent the above stated procedure on 02/07/2019. Patient tolerated the procedure well and brought to the recovery room in good condition and subsequently to the floor.  POD #1 BP: 109/62 ;  Pulse: 80 ; Temp: 97.8 F (36.6 C) ; Resp: 16 Patient reports pain as mild, pain controlled. No events throughout the night.  She states that yesterday she had an incident of buckling when getting up as well as feeling weakness in the leg.  Dr. Alvan Dame discussed the procedure, findings and expectations moving forward.  Patient will be discharged home. Dorsiflexion/plantar flexion intact, incision: dressing C/D/I, no cellulitis present and compartment soft.   LABS  Basename    HGB     11.0  HCT     36.2    Discharge Exam: General appearance: alert, cooperative and no distress Extremities: Homans sign is  negative, no sign of DVT, no edema, redness or tenderness in the calves or thighs and no ulcers, gangrene or trophic changes  Disposition:  Home with follow up in 2 weeks   Follow-up Information    [facility] Go on 02/10/2019.   Why: You are scheduled for a physical therapy appointment on 02-10-19 at 12:00 pm.   Contact information: [place] [address] [id number]        Danae Orleans, PA-C. Go on 02/24/2019.   Specialty: Orthopedic Surgery Why: You are scheduled for a post-operative appointment on 02-24-19 at 9:00 am.  Contact information: [address] [place] [address] [id number]           Discharge Instructions    Call MD / Call 911   Complete by: As directed    If you experience chest pain or shortness of breath, CALL 911 and be transported to the hospital emergency room.  If you develope a fever above 101 F, pus (white drainage) or increased drainage or redness at the wound, or calf pain, call your surgeon's office.   Change dressing   Complete by: As directed    Maintain surgical dressing until follow up in the clinic. If the edges start to pull up, may reinforce with tape. If the dressing is no longer working, may remove and cover with gauze and tape, but must keep the area dry and clean.  Call with any questions or concerns.   Constipation Prevention   Complete by: As directed    Drink plenty of fluids.  Prune juice may be helpful.  You may use a stool softener, such as Colace (over the counter) 100 mg twice a day.  Use MiraLax (over the counter) for constipation as needed.   Diet - low sodium heart healthy   Complete by: As directed    Discharge instructions   Complete by: As directed    Maintain surgical dressing until follow up in the clinic. If the edges start to pull up, may reinforce with tape. If the dressing is no longer working, may remove and cover with gauze and tape, but must keep the  area dry and clean.  Follow up in 2 weeks at [facility]. Call with any questions or concerns.   Increase activity slowly as tolerated   Complete by: As directed    Weight bearing as tolerated with assist device (walker, cane, etc) as directed, use it as long as suggested by your surgeon or therapist, typically at least 4-6 weeks.   TED hose   Complete by: As directed    Use stockings (TED hose) for 2 weeks on both leg(s).  You may remove them at night for sleeping.      Allergies as of 02/08/2019      Reactions   Codeine  Anxiety, Other (See Comments)   Hallucinations, tolerates oxycodone    Macrolides And Ketolides Nausea And Vomiting   Procaine Hcl Palpitations   Aspirin Nausea And Vomiting, Other (See Comments)   Reaction:  Burns pts stomach    Benadryl [diphenhydramine] Other (See Comments)   Per MD "inhibits potency of gabapentin, lithium etc"   Diflucan [fluconazole] Other (See Comments)   Unknown reaction   Dilaudid [hydromorphone Hcl] Other (See Comments)   Migraines and nightmares    Sulfa Antibiotics Other (See Comments)   Unknown reaction      Medication List    STOP taking these medications   diclofenac Sodium 1 % Gel Commonly known as: VOLTAREN   oxyCODONE-acetaminophen 5-325 MG tablet Commonly known as: PERCOCET/ROXICET     TAKE these medications   acetaminophen 500 MG tablet Commonly known as: TYLENOL Take 1,000 mg by mouth 3 (three) times daily.   ALPRAZolam 0.5 MG tablet Commonly known as: XANAX Take 1 tablet (0.5 mg total) by mouth 4 (four) times daily as needed for anxiety. What changed: when to take this   atorvastatin 40 MG tablet Commonly known as: LIPITOR Take 40 mg by mouth at bedtime.   BD Pen Needle Nano U/F 32G X 4 MM Misc Generic drug: Insulin Pen Needle USE TO INJECT 5 TIMES DAILY AS DIRECTED   celecoxib 200 MG capsule Commonly known as: CELEBREX Take 200 mg by mouth at bedtime.   dicyclomine 10 MG capsule Commonly  known as: BENTYL Take 1 capsule (10 mg total) by mouth every 8 (eight) hours as needed for spasms.   diphenoxylate-atropine 2.5-0.025 MG tablet Commonly known as: Lomotil Take 1 tablet by mouth 2 (two) times daily as needed for diarrhea or loose stools.   divalproex 250 MG 24 hr tablet Commonly known as: DEPAKOTE ER TAKE 2 TABLETS(500 MG) BY MOUTH AT BEDTIME What changed: See the new instructions.   docusate sodium 100 MG capsule Commonly known as: Colace Take 1 capsule (100 mg total) by mouth 2 (two) times daily.   donepezil 10 MG tablet Commonly known as: ARICEPT Take 1 tablet at Bedtime What changed:   how much to take  how to take this  when to take this  additional instructions   doxepin 10 MG capsule Commonly known as: SINEQUAN Take 1 capsule (10 mg total) by mouth at bedtime as needed.   ferrous sulfate 325 (65 FE) MG tablet Commonly known as: FerrouSul Take 1 tablet (325 mg total) by mouth 3 (three) times daily with meals for 14 days.   fluticasone 50 MCG/ACT nasal spray Commonly known as: FLONASE Place 1 spray into both nostrils daily as needed for allergies or rhinitis.   gabapentin 400 MG capsule Commonly known as: NEURONTIN Take 400 mg by mouth 3 (three) times daily.   glucose blood test strip Commonly known as: Contour Next Test Use to test blood sugar 2 times daily   Lantus SoloStar 100 UNIT/ML Solostar Pen Generic drug: Insulin Glargine Inject 48 units under the skin once daily. What changed:   how much to take  how to take this  when to take this  additional instructions   levothyroxine 75 MCG tablet Commonly known as: SYNTHROID TAKE 1 TABLET BY MOUTH DAILY BEFORE BREAKFAST   lithium carbonate 450 MG CR tablet Commonly known as: ESKALITH TAKE 1 TABLET(450 MG) BY MOUTH AT BEDTIME What changed: See the new instructions.   loperamide 2 MG tablet Commonly known as: IMODIUM A-D Take 2 mg by mouth  2 (two) times daily as needed for  diarrhea or loose stools.   losartan 50 MG tablet Commonly known as: COZAAR Take 25 mg by mouth daily.   Melatonin 10 MG Tbcr Take 10 mg by mouth at bedtime.   memantine 10 MG tablet Commonly known as: NAMENDA TAKE 1 TABLET(10 MG) BY MOUTH EVERY EVENING What changed: See the new instructions.   Mesalamine 800 MG Tbec Take 2 tablets (1,600 mg total) by mouth 3 (three) times daily for 3 days. What changed:   when to take this  reasons to take this   methocarbamol 500 MG tablet Commonly known as: Robaxin Take 1 tablet (500 mg total) by mouth every 6 (six) hours as needed for muscle spasms. What changed: when to take this   Microlet Lancets Misc USE DAILY AS DIRECTED   nitroGLYCERIN 0.4 MG SL tablet Commonly known as: NITROSTAT Place 1 tablet (0.4 mg total) under the tongue every 5 (five) minutes as needed for chest pain.   ondansetron 4 MG disintegrating tablet Commonly known as: Zofran ODT 4mg  ODT q4 hours prn nausea/vomit What changed:   how much to take  how to take this  when to take this  reasons to take this  additional instructions   oxyCODONE 5 MG immediate release tablet Commonly known as: Oxy IR/ROXICODONE Take 1-3 tablets (5-15 mg total) by mouth every 4 (four) hours as needed for moderate pain or severe pain. What changed:   how much to take  when to take this  reasons to take this   Ozempic (1 MG/DOSE) 2 MG/1.5ML Sopn Generic drug: Semaglutide (1 MG/DOSE) INJECT 1 MG UNDER THE SKIN ONCE A WEEK AS DIRECTED What changed: Another medication with the same name was added. Make sure you understand how and when to take each.   Ozempic (0.25 or 0.5 MG/DOSE) 2 MG/1.5ML Sopn Generic drug: Semaglutide(0.25 or 0.5MG /DOS) INJECT 0.5 MG INTO THE SKIN ONCE WEEKLY What changed: You were already taking a medication with the same name, and this prescription was added. Make sure you understand how and when to take each.   polyethylene glycol 17 g packet  Commonly known as: MIRALAX / GLYCOLAX Take 17 g by mouth 2 (two) times daily. What changed:   when to take this  reasons to take this   QUEtiapine 100 MG tablet Commonly known as: SEROQUEL TAKE 1 AND 1/2 TABLETS BY MOUTH EVERY MORNING AND 2 TABLETS EVERY NIGHT AT BEDTIME What changed:   how much to take  how to take this  when to take this  additional instructions   rivaroxaban 10 MG Tabs tablet Commonly known as: Xarelto Take 1 tablet (10 mg total) by mouth daily.   rOPINIRole 0.5 MG tablet Commonly known as: REQUIP Take 3 tablets (1.5 mg total) by mouth 3 (three) times daily. What changed:   how much to take  when to take this  additional instructions   Vitamin D 50 MCG (2000 UT) tablet Take 2,000 Units by mouth daily.   zaleplon 5 MG capsule Commonly known as: SONATA TAKE 1 CAPSULE(5 MG) BY MOUTH AT BEDTIME What changed: See the new instructions.            Discharge Care Instructions  (From admission, onward)         Start     Ordered   02/08/19 0000  Change dressing    Comments: Maintain surgical dressing until follow up in the clinic. If the edges start to pull up, may reinforce  with tape. If the dressing is no longer working, may remove and cover with gauze and tape, but must keep the area dry and clean.  Call with any questions or concerns.   02/08/19 0845           Signed: West Pugh. Havannah Streat   PA-C  02/09/2019, 10:15 AM

## 2019-02-10 DIAGNOSIS — M25662 Stiffness of left knee, not elsewhere classified: Secondary | ICD-10-CM | POA: Diagnosis not present

## 2019-02-10 DIAGNOSIS — M25562 Pain in left knee: Secondary | ICD-10-CM | POA: Diagnosis not present

## 2019-02-10 DIAGNOSIS — M1712 Unilateral primary osteoarthritis, left knee: Secondary | ICD-10-CM | POA: Diagnosis not present

## 2019-02-13 DIAGNOSIS — M25662 Stiffness of left knee, not elsewhere classified: Secondary | ICD-10-CM | POA: Diagnosis not present

## 2019-02-13 DIAGNOSIS — M25562 Pain in left knee: Secondary | ICD-10-CM | POA: Diagnosis not present

## 2019-02-16 DIAGNOSIS — M25562 Pain in left knee: Secondary | ICD-10-CM | POA: Diagnosis not present

## 2019-02-16 DIAGNOSIS — M25662 Stiffness of left knee, not elsewhere classified: Secondary | ICD-10-CM | POA: Diagnosis not present

## 2019-02-17 ENCOUNTER — Other Ambulatory Visit: Payer: Self-pay | Admitting: Psychiatry

## 2019-02-17 DIAGNOSIS — F5101 Primary insomnia: Secondary | ICD-10-CM

## 2019-02-17 DIAGNOSIS — M25562 Pain in left knee: Secondary | ICD-10-CM | POA: Diagnosis not present

## 2019-02-17 DIAGNOSIS — M25662 Stiffness of left knee, not elsewhere classified: Secondary | ICD-10-CM | POA: Diagnosis not present

## 2019-02-18 ENCOUNTER — Telehealth: Payer: Self-pay | Admitting: Psychiatry

## 2019-02-18 NOTE — Telephone Encounter (Signed)
She reports that she may have taken too much medication.   Husband reports that he prepared her medication for her in a medication box and that pt was concerned that she had taken her evening and bedtime medication for Saturday and Sunday.s Husband report that after they called answering service they found a set of bedtime medication in the bed where apparently it had spilled/opened up when pt went to take her medication. Pt's husband reports that the medications that remained missing and that pt likely took were Seroquel 200 mg, Requip 2 mg, Gabapentin 400 mg, and Atorvastatin. He reports that Alprazolam, Sonata, and Oxycodone are kept in a separate place, and not in her pill box, so these medications were not overtaken.   Discussed that medications she took inadvertently could cause some increased drowsiness, possible nausea with higher doses of Requip, and possibly restless legs with additional Seroquel since she has had restless legs with higher doses of Seroquel in the past. Discussed that additional medications that pt took are unlikely to cause a serious/dangerous adverse reaction. Advised husband to call back if pt has any significant changes or difficulties.

## 2019-03-01 ENCOUNTER — Other Ambulatory Visit (HOSPITAL_BASED_OUTPATIENT_CLINIC_OR_DEPARTMENT_OTHER): Payer: Self-pay | Admitting: Orthopedic Surgery

## 2019-03-01 ENCOUNTER — Ambulatory Visit (HOSPITAL_BASED_OUTPATIENT_CLINIC_OR_DEPARTMENT_OTHER)
Admission: RE | Admit: 2019-03-01 | Discharge: 2019-03-01 | Disposition: A | Discharge status: Home / Self Care | Payer: Medicare Other | Source: Ambulatory Visit | Attending: Orthopedic Surgery | Admitting: Orthopedic Surgery

## 2019-03-01 ENCOUNTER — Other Ambulatory Visit: Payer: Self-pay

## 2019-03-01 DIAGNOSIS — M79605 Pain in left leg: Secondary | ICD-10-CM | POA: Diagnosis not present

## 2019-03-02 ENCOUNTER — Other Ambulatory Visit: Payer: Self-pay | Admitting: Endocrinology

## 2019-03-06 ENCOUNTER — Other Ambulatory Visit: Payer: Self-pay | Admitting: Psychiatry

## 2019-03-06 DIAGNOSIS — F319 Bipolar disorder, unspecified: Secondary | ICD-10-CM

## 2019-03-06 NOTE — Telephone Encounter (Signed)
Not sure patient currently taking

## 2019-03-10 ENCOUNTER — Other Ambulatory Visit: Payer: Self-pay | Admitting: Psychiatry

## 2019-03-10 DIAGNOSIS — F319 Bipolar disorder, unspecified: Secondary | ICD-10-CM

## 2019-03-12 ENCOUNTER — Other Ambulatory Visit: Payer: Self-pay | Admitting: Psychiatry

## 2019-03-12 DIAGNOSIS — F319 Bipolar disorder, unspecified: Secondary | ICD-10-CM

## 2019-03-12 DIAGNOSIS — F5101 Primary insomnia: Secondary | ICD-10-CM

## 2019-03-13 NOTE — Telephone Encounter (Signed)
I know you have not seen her in a bit, is this okay?

## 2019-03-14 DIAGNOSIS — R300 Dysuria: Secondary | ICD-10-CM | POA: Diagnosis not present

## 2019-03-17 ENCOUNTER — Encounter: Payer: Self-pay | Admitting: Podiatry

## 2019-03-17 ENCOUNTER — Ambulatory Visit (INDEPENDENT_AMBULATORY_CARE_PROVIDER_SITE_OTHER): Payer: Medicare Other | Admitting: Podiatry

## 2019-03-17 ENCOUNTER — Other Ambulatory Visit: Payer: Self-pay

## 2019-03-17 VITALS — BP 144/88

## 2019-03-17 DIAGNOSIS — M79674 Pain in right toe(s): Secondary | ICD-10-CM

## 2019-03-17 DIAGNOSIS — L600 Ingrowing nail: Secondary | ICD-10-CM

## 2019-03-20 ENCOUNTER — Encounter: Payer: Self-pay | Admitting: Podiatry

## 2019-03-20 NOTE — Progress Notes (Signed)
Subjective:  Patient ID: [name] [name], female    DOB: [birth year],  MRN: [id number]  No chief complaint on file.   75 y.o. female presents with the above complaint.  Patient presents with right medial ingrown that has been causing her a lot of pain.  She states is medial border of the right big toe has been going on for about a week.  She states is a throbbing sensation.  She states that there has not been any swelling and oozing but is causing her a lot of pain.  She states she has been taking Keflex for possible infection.  She is almost done with the course.  I encouraged her to complete her course as soon as possible.   Review of Systems: Negative except as noted in the HPI. Denies N/V/F/Ch.  Past Medical History:  Diagnosis Date  . Alcohol abuse   . Anxiety    takes Valium daily as needed and Ativan daily  . Bilateral hearing loss   . Bipolar 1 disorder ([place])    takes Lithium nightly and Synthroid daily  . Chronic back pain    DDD; "all over" (09/14/2017)  . Colitis, ischemic ([place]) 2012  . Confusion    r/t meds  . Depression    takes Prozac daily and Bupspirone   . Diverticulosis   . Dyslipidemia    takes Crestor daily  . Fibromyalgia   . Headache    "weekly" (09/14/2017)  . Hepatic steatosis 06/18/12   severe  . Hyperlipidemia   . Hypertension   . Hypothyroidism   . IBS (irritable bowel syndrome)   . Ischemic colitis ([place])   . Joint pain   . Joint swelling   . Lupus erythematosus tumidus    tumid-skin  . Migraine    "1-2/yr; maybe" (09/14/2017)  . Numbness    in right foot  . Osteoarthritis    "all over" (09/14/2017)  . Osteoarthritis cervical spine   . Osteoarthritis of hand    bilateral  . Pneumonia    "walking pneumonia several times; long time since the last time" (09/14/2017)  . Restless leg syndrome    takes Requip nightly  . Sciatica   . Type II diabetes mellitus ([place])   . Urinary frequency   . Urinary leakage   . Urinary urgency   . Urinary,  incontinence, stress female   . Walking pneumonia    last time more than 82yrs ago    Current Outpatient Medications:  .  acetaminophen (TYLENOL) 500 MG tablet, Take 1,000 mg by mouth 3 (three) times daily., Disp: , Rfl:  .  ALPRAZolam (XANAX) 0.5 MG tablet, Take 1 tablet (0.5 mg total) by mouth 4 (four) times daily as needed for anxiety. (Patient taking differently: Take 0.5 mg by mouth 4 (four) times daily. ), Disp: 120 tablet, Rfl: 2 .  amoxicillin (AMOXIL) 500 MG capsule, amoxicillin 500 mg capsule  take 4 capsules by mouth 1 HOUR PRIOR TO DENTAL APPOINTMENT, Disp: , Rfl:  .  atorvastatin (LIPITOR) 40 MG tablet, Take 40 mg by mouth at bedtime. , Disp: , Rfl: 0 .  BD PEN NEEDLE NANO U/F 32G X 4 MM MISC, USE TO INJECT 5 TIMES DAILY AS DIRECTED, Disp: 300 each, Rfl: 0 .  celecoxib (CELEBREX) 200 MG capsule, Take 200 mg by mouth at bedtime., Disp: , Rfl:  .  cephALEXin (KEFLEX) 500 MG capsule, cephalexin 500 mg capsule  TAKE 1 CAPSULE BY MOUTH FOUR TIMES DAILY FOR 5 DAYS, Disp: ,  Rfl:  .  Cholecalciferol (VITAMIN D) 50 MCG (2000 UT) tablet, Take 2,000 Units by mouth daily., Disp: , Rfl:  .  ciprofloxacin (CIPRO) 250 MG tablet, ciprofloxacin 250 mg tablet  TAKE 1 TABLET BY MOUTH EVERY 12 HOURS FOR 3 DAYS, Disp: , Rfl:  .  dicyclomine (BENTYL) 10 MG capsule, Take 1 capsule (10 mg total) by mouth every 8 (eight) hours as needed for spasms., Disp: 20 capsule, Rfl: 0 .  diphenoxylate-atropine (LOMOTIL) 2.5-0.025 MG tablet, Take 1 tablet by mouth 2 (two) times daily as needed for diarrhea or loose stools., Disp: 30 tablet, Rfl: 0 .  divalproex (DEPAKOTE ER) 250 MG 24 hr tablet, TAKE 2 TABLETS(500 MG) BY MOUTH AT BEDTIME, Disp: 180 tablet, Rfl: 0 .  docusate sodium (COLACE) 100 MG capsule, Take 1 capsule (100 mg total) by mouth 2 (two) times daily., Disp: 28 capsule, Rfl: 0 .  donepezil (ARICEPT) 10 MG tablet, Take 1 tablet at Bedtime (Patient taking differently: Take 10 mg by mouth at bedtime. ), Disp:  90 tablet, Rfl: 1 .  doxepin (SINEQUAN) 10 MG capsule, TAKE 1 CAPSULE(10 MG) BY MOUTH AT BEDTIME AS NEEDED, Disp: 30 capsule, Rfl: 1 .  fluticasone (FLONASE) 50 MCG/ACT nasal spray, Place 1 spray into both nostrils daily as needed for allergies or rhinitis., Disp: , Rfl:  .  furosemide (LASIX) 20 MG tablet, furosemide 20 mg tablet  TAKE 1 TABLET BY MOUTH EVERY DAY, Disp: , Rfl:  .  gabapentin (NEURONTIN) 400 MG capsule, Take 400 mg by mouth 3 (three) times daily. , Disp: , Rfl:  .  glucose blood (CONTOUR NEXT TEST) test strip, Use to test blood sugar 2 times daily, Disp: 100 each, Rfl: 3 .  hyoscyamine (LEVBID) 0.375 MG 12 hr tablet, hyoscyamine ER 0.375 mg tablet,extended release,12 hr  take 1 tablet by mouth once daily BEFORE BREAKFAST, Disp: , Rfl:  .  Insulin Glargine (LANTUS SOLOSTAR) 100 UNIT/ML Solostar Pen, ADMINISTER 48 UNITS UNDER THE SKIN EVERY DAY, Disp: 15 mL, Rfl: 0 .  levothyroxine (SYNTHROID) 75 MCG tablet, TAKE 1 TABLET BY MOUTH DAILY BEFORE BREAKFAST, Disp: 90 tablet, Rfl: 0 .  lithium carbonate (ESKALITH) 450 MG CR tablet, Take 1 tablet (450 mg total) by mouth at bedtime., Disp: 90 tablet, Rfl: 0 .  loperamide (IMODIUM A-D) 2 MG tablet, Take 2 mg by mouth 2 (two) times daily as needed for diarrhea or loose stools., Disp: , Rfl:  .  losartan (COZAAR) 50 MG tablet, Take 25 mg by mouth daily. , Disp: , Rfl: 0 .  Melatonin 10 MG TBCR, Take 10 mg by mouth at bedtime., Disp: , Rfl:  .  memantine (NAMENDA) 10 MG tablet, TAKE 1 TABLET(10 MG) BY MOUTH EVERY EVENING (Patient taking differently: Take 10 mg by mouth every evening. At 1700), Disp: 90 tablet, Rfl: 0 .  methocarbamol (ROBAXIN) 500 MG tablet, Take 1 tablet (500 mg total) by mouth every 6 (six) hours as needed for muscle spasms., Disp: 40 tablet, Rfl: 0 .  Microlet Lancets MISC, USE DAILY AS DIRECTED, Disp: 100 each, Rfl: 3 .  nitrofurantoin, macrocrystal-monohydrate, (MACROBID) 100 MG capsule, nitrofurantoin  monohydrate/macrocrystals 100 mg capsule  TAKE 1 CAPSULE BY MOUTH EVERY 12 HOURS WITH FOOD FOR 7 DAYS, Disp: , Rfl:  .  nitroGLYCERIN (NITROSTAT) 0.4 MG SL tablet, Place 1 tablet (0.4 mg total) under the tongue every 5 (five) minutes as needed for chest pain., Disp: 25 tablet, Rfl: 2 .  Omega-3 Fatty Acids (FISH OIL)  1000 MG CAPS, Take by mouth., Disp: , Rfl:  .  ondansetron (ZOFRAN ODT) 4 MG disintegrating tablet, 4mg  ODT q4 hours prn nausea/vomit (Patient taking differently: Take 4 mg by mouth every 6 (six) hours as needed for nausea or vomiting. ), Disp: 12 tablet, Rfl: 0 .  oxyCODONE (OXY IR/ROXICODONE) 5 MG immediate release tablet, Take 1-3 tablets (5-15 mg total) by mouth every 4 (four) hours as needed for moderate pain or severe pain., Disp: 90 tablet, Rfl: 0 .  OZEMPIC, 0.25 OR 0.5 MG/DOSE, 2 MG/1.5ML SOPN, INJECT 0.5 MG INTO THE SKIN ONCE WEEKLY, Disp: 1.5 mL, Rfl: 2 .  OZEMPIC, 1 MG/DOSE, 2 MG/1.5ML SOPN, INJECT 1 MG UNDER THE SKIN ONCE A WEEK AS DIRECTED, Disp: 3 mL, Rfl: 2 .  polyethylene glycol (MIRALAX / GLYCOLAX) 17 g packet, Take 17 g by mouth 2 (two) times daily., Disp: 28 packet, Rfl: 0 .  predniSONE (DELTASONE) 5 MG tablet, , Disp: , Rfl:  .  QUEtiapine (SEROQUEL) 100 MG tablet, TAKE 1 AND 1/2 TABLET BY MOUTH EVERY MORNING, THEN 2 TABLETS AT NIGHT, Disp: 360 tablet, Rfl: 0 .  rivaroxaban (XARELTO) 10 MG TABS tablet, Take 1 tablet (10 mg total) by mouth daily., Disp: 21 tablet, Rfl: 0 .  rOPINIRole (REQUIP) 0.5 MG tablet, Take 3 tablets (1.5 mg total) by mouth 3 (three) times daily. (Patient taking differently: Take 0.5-1.5 mg by mouth See admin instructions. Take 1.5 mg twice daily at 5pm and bedtime with seroquel, may take an additional 0.5 mg dose during the day as needed for restless legs), Disp: 540 tablet, Rfl: 0 .  sulfamethoxazole-trimethoprim (BACTRIM DS) 800-160 MG tablet, Take 1 tablet by mouth 2 (two) times daily., Disp: , Rfl:  .  triamcinolone cream (KENALOG) 0.1 %,  triamcinolone acetonide 0.1 % topical cream  APPLY TO LEFT LEG TWICE DAILY, Disp: , Rfl:  .  zaleplon (SONATA) 5 MG capsule, TAKE 1 CAPSULE(5 MG) BY MOUTH AT BEDTIME (Patient taking differently: Take 5 mg by mouth at bedtime. ), Disp: 30 capsule, Rfl: 1 .  ferrous sulfate (FERROUSUL) 325 (65 FE) MG tablet, Take 1 tablet (325 mg total) by mouth 3 (three) times daily with meals for 14 days., Disp: 42 tablet, Rfl: 0 .  Mesalamine 800 MG TBEC, Take 2 tablets (1,600 mg total) by mouth 3 (three) times daily for 3 days. (Patient taking differently: Take 1,600 mg by mouth 2 (two) times daily as needed (ulcerative colitis flare). ), Disp: 270 tablet, Rfl: 0  Social History   Tobacco Use  Smoking Status Never Smoker  Smokeless Tobacco Never Used    Allergies  Allergen Reactions  . Codeine Anxiety and Other (See Comments)    Hallucinations, tolerates oxycodone   . Macrolides And Ketolides Nausea And Vomiting  . Procaine Hcl Palpitations  . Aspirin Nausea And Vomiting and Other (See Comments)    Reaction:  Burns pts stomach   . Benadryl [Diphenhydramine] Other (See Comments)    Per MD "inhibits potency of gabapentin, lithium etc"  . Diflucan [Fluconazole] Other (See Comments)    Unknown reaction   . Dilaudid [Hydromorphone Hcl] Other (See Comments)    Migraines and nightmares   . Sulfa Antibiotics Other (See Comments)    Unknown reaction   Objective:   Vitals:   03/17/19 1516  BP: (!) 144/88   There is no height or weight on file to calculate BMI. Constitutional Well developed. Well nourished.  Vascular Dorsalis pedis pulses palpable bilaterally. Posterior tibial pulses  palpable bilaterally. Capillary refill normal to all digits.  No cyanosis or clubbing noted. Pedal hair growth normal.  Neurologic Normal speech. Oriented to person, place, and time. Epicritic sensation to light touch grossly present bilaterally.  Dermatologic Painful ingrowing nail at medial nail borders of the  hallux nail right. No other open wounds. No skin lesions.  Orthopedic: Normal joint ROM without pain or crepitus bilaterally. No visible deformities. No bony tenderness.   Radiographs: None Assessment:  No diagnosis found. Plan:  Patient was evaluated and treated and all questions answered.  Ingrown Nail, right -Patient elects to proceed with minor surgery to remove ingrown toenail removal today. Consent reviewed and signed by patient. -Ingrown nail excised. See procedure note. -Educated on post-procedure care including soaking. Written instructions provided and reviewed. -Patient to follow up in 2 weeks for nail check.  Procedure: Excision of Ingrown Toenail Location: Right 1st toe medial nail borders. Anesthesia: Lidocaine 1% plain; 1.5 mL and Marcaine 0.5% plain; 1.5 mL, digital block. Skin Prep: Betadine. Dressing: Silvadene; telfa; dry, sterile, compression dressing. Technique: Following skin prep, the toe was exsanguinated and a tourniquet was secured at the base of the toe. The affected nail border was freed, split with a nail splitter, and excised. Chemical matrixectomy was then performed with phenol and irrigated out with alcohol. The tourniquet was then removed and sterile dressing applied. Disposition: Patient tolerated procedure well. Patient to return in 2 weeks for follow-up.   Return in about 2 weeks (around 03/31/2019).

## 2019-03-23 ENCOUNTER — Telehealth: Payer: Self-pay | Admitting: *Deleted

## 2019-03-23 DIAGNOSIS — Z471 Aftercare following joint replacement surgery: Secondary | ICD-10-CM | POA: Diagnosis not present

## 2019-03-23 DIAGNOSIS — Z96652 Presence of left artificial knee joint: Secondary | ICD-10-CM | POA: Diagnosis not present

## 2019-03-23 NOTE — Telephone Encounter (Signed)
Patient called and left a message on the [facility]ery[facility] voice mail and stated that she was in on 03/17/2019 and the toenail was removed and is still hurting as of yesterday and I called and left a message for the patient to call the office. [name] [name]

## 2019-03-29 ENCOUNTER — Other Ambulatory Visit: Payer: Self-pay | Admitting: Endocrinology

## 2019-03-30 ENCOUNTER — Ambulatory Visit: Payer: Medicare Other | Attending: Internal Medicine

## 2019-03-30 DIAGNOSIS — Z23 Encounter for immunization: Secondary | ICD-10-CM | POA: Insufficient documentation

## 2019-03-30 NOTE — Progress Notes (Signed)
   Covid-19 Vaccination Clinic  Name:  [name] [name]    MRN: [id number] DOB: 1946/01/14  03/30/2019  [name] [name] was observed post Covid-19 immunization for 15 minutes without incidence. She was provided with Vaccine Information Sheet and instruction to access the V-Safe system.   [name] [name] was instructed to call 911 with any severe reactions post vaccine: Marland Kitchen Difficulty breathing  . Swelling of your face and throat  . A fast heartbeat  . A bad rash all over your body  . Dizziness and weakness    Immunizations Administered    Name Date Dose VIS Date Route   Pfizer COVID-19 Vaccine 03/30/2019  9:03 AM 0.3 mL 02/17/2019 Intramuscular   Manufacturer: [place]   Lot: [id number]   [place]: [id number]

## 2019-03-31 ENCOUNTER — Ambulatory Visit (INDEPENDENT_AMBULATORY_CARE_PROVIDER_SITE_OTHER): Payer: Medicare Other | Admitting: Podiatry

## 2019-03-31 ENCOUNTER — Encounter: Payer: Self-pay | Admitting: Psychiatry

## 2019-03-31 ENCOUNTER — Other Ambulatory Visit: Payer: Self-pay

## 2019-03-31 ENCOUNTER — Ambulatory Visit (INDEPENDENT_AMBULATORY_CARE_PROVIDER_SITE_OTHER): Payer: Medicare Other | Admitting: Psychiatry

## 2019-03-31 VITALS — Wt 155.0 lb

## 2019-03-31 DIAGNOSIS — M79674 Pain in right toe(s): Secondary | ICD-10-CM

## 2019-03-31 DIAGNOSIS — Z79899 Other long term (current) drug therapy: Secondary | ICD-10-CM | POA: Diagnosis not present

## 2019-03-31 DIAGNOSIS — F319 Bipolar disorder, unspecified: Secondary | ICD-10-CM | POA: Diagnosis not present

## 2019-03-31 DIAGNOSIS — L600 Ingrowing nail: Secondary | ICD-10-CM

## 2019-03-31 DIAGNOSIS — F5101 Primary insomnia: Secondary | ICD-10-CM

## 2019-03-31 DIAGNOSIS — G2581 Restless legs syndrome: Secondary | ICD-10-CM

## 2019-03-31 DIAGNOSIS — F411 Generalized anxiety disorder: Secondary | ICD-10-CM | POA: Diagnosis not present

## 2019-03-31 MED ORDER — ESCITALOPRAM OXALATE 5 MG PO TABS: 5.0000 mg | ORAL_TABLET | Freq: Every day | ORAL | 0 refills | Status: DC

## 2019-03-31 MED ORDER — ROPINIROLE HCL 0.5 MG PO TABS: 1.5000 mg | ORAL_TABLET | Freq: Three times a day (TID) | ORAL | 0 refills | Status: DC

## 2019-03-31 MED ORDER — QUETIAPINE FUMARATE 200 MG PO TABS: ORAL_TABLET | ORAL | 0 refills | Status: DC

## 2019-03-31 MED ORDER — ALPRAZOLAM 0.5 MG PO TABS: 0.5000 mg | ORAL_TABLET | Freq: Four times a day (QID) | ORAL | 2 refills | Status: DC | PRN
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
## 2019-04-03 ENCOUNTER — Telehealth: Payer: Self-pay

## 2019-04-03 ENCOUNTER — Encounter: Payer: Self-pay | Admitting: Podiatry

## 2019-04-03 ENCOUNTER — Other Ambulatory Visit: Payer: Self-pay | Admitting: *Deleted

## 2019-04-03 ENCOUNTER — Telehealth: Payer: Self-pay | Admitting: Gastroenterology

## 2019-04-03 ENCOUNTER — Other Ambulatory Visit (INDEPENDENT_AMBULATORY_CARE_PROVIDER_SITE_OTHER): Payer: Medicare Other

## 2019-04-03 DIAGNOSIS — R197 Diarrhea, unspecified: Secondary | ICD-10-CM

## 2019-04-03 DIAGNOSIS — R109 Unspecified abdominal pain: Secondary | ICD-10-CM | POA: Diagnosis not present

## 2019-04-03 DIAGNOSIS — E1165 Type 2 diabetes mellitus with hyperglycemia: Secondary | ICD-10-CM | POA: Diagnosis not present

## 2019-04-03 DIAGNOSIS — R1032 Left lower quadrant pain: Secondary | ICD-10-CM

## 2019-04-03 DIAGNOSIS — B379 Candidiasis, unspecified: Secondary | ICD-10-CM | POA: Diagnosis not present

## 2019-04-03 DIAGNOSIS — Z0000 Encounter for general adult medical examination without abnormal findings: Secondary | ICD-10-CM | POA: Diagnosis not present

## 2019-04-03 DIAGNOSIS — E039 Hypothyroidism, unspecified: Secondary | ICD-10-CM | POA: Diagnosis not present

## 2019-04-03 DIAGNOSIS — M797 Fibromyalgia: Secondary | ICD-10-CM | POA: Diagnosis not present

## 2019-04-03 DIAGNOSIS — F319 Bipolar disorder, unspecified: Secondary | ICD-10-CM | POA: Diagnosis not present

## 2019-04-03 DIAGNOSIS — R300 Dysuria: Secondary | ICD-10-CM | POA: Diagnosis not present

## 2019-04-03 DIAGNOSIS — Z1239 Encounter for other screening for malignant neoplasm of breast: Secondary | ICD-10-CM | POA: Diagnosis not present

## 2019-04-03 DIAGNOSIS — I10 Essential (primary) hypertension: Secondary | ICD-10-CM | POA: Diagnosis not present

## 2019-04-03 DIAGNOSIS — E785 Hyperlipidemia, unspecified: Secondary | ICD-10-CM | POA: Diagnosis not present

## 2019-04-03 LAB — CBC WITH DIFFERENTIAL/PLATELET
Basophils Absolute: 0 10*3/uL (ref 0.0–0.1)
Basophils Relative: 0.4 % (ref 0.0–3.0)
Eosinophils Absolute: 0.3 10*3/uL (ref 0.0–0.7)
Eosinophils Relative: 3.6 % (ref 0.0–5.0)
HCT: 42.4 % (ref 36.0–46.0)
Hemoglobin: 13.5 g/dL (ref 12.0–15.0)
Lymphocytes Relative: 21.7 % (ref 12.0–46.0)
Lymphs Abs: 1.9 10*3/uL (ref 0.7–4.0)
MCHC: 31.9 g/dL (ref 30.0–36.0)
MCV: 88.3 fl (ref 78.0–100.0)
Monocytes Absolute: 0.6 10*3/uL (ref 0.1–1.0)
Monocytes Relative: 6.6 % (ref 3.0–12.0)
Neutro Abs: 6.1 10*3/uL (ref 1.4–7.7)
Neutrophils Relative %: 67.7 % (ref 43.0–77.0)
Platelets: 269 10*3/uL (ref 150.0–400.0)
RBC: 4.8 Mil/uL (ref 3.87–5.11)
RDW: 15.2 % (ref 11.5–15.5)
WBC: 8.9 10*3/uL (ref 4.0–10.5)

## 2019-04-03 LAB — BASIC METABOLIC PANEL
BUN: 10 mg/dL (ref 6–23)
CO2: 28 mEq/L (ref 19–32)
Calcium: 10.2 mg/dL (ref 8.4–10.5)
Chloride: 105 mEq/L (ref 96–112)
Creatinine, Ser: 0.9 mg/dL (ref 0.40–1.20)
GFR: 61.69 mL/min (ref >60.00)
Glucose, Bld: 103 mg/dL — ABNORMAL HIGH (ref 70–99)
Potassium: 3.7 mEq/L (ref 3.5–5.1)
Sodium: 138 mEq/L (ref 135–145)

## 2019-04-03 MED ORDER — ONDANSETRON HCL 4 MG PO TABS: 4.0000 mg | ORAL_TABLET | Freq: Four times a day (QID) | ORAL | 1 refills | Status: DC | PRN

## 2019-04-03 NOTE — Telephone Encounter (Signed)
She can reduce her Lantus to 40 units instead of 54 until she starts eating normally and also reduce Ozempic to 0.5 mg for the next dose

## 2019-04-03 NOTE — Telephone Encounter (Signed)
Patient needs to speak to nurse to give blood sugar readings-she feels her blood sugar is running lower than usual and wants to know if adjustments need to be made

## 2019-04-03 NOTE — Progress Notes (Signed)
Subjective: [name] [name] is a 74 y.o.  female returns to office today for follow up evaluation after having right Hallux Medial border nail avulsion performed. Patient has been soaking using epsom salt and applying topical antibiotic covered with bandaid daily. Patient denies fevers, chills, nausea, vomiting. Denies any calf pain, chest pain, SOB.   Objective:  Vitals: Reviewed  General: Well developed, nourished, in no acute distress, alert and oriented x3   Dermatology: Skin is warm, dry and supple bilateral. Medial hallux nail border appears to be clean, dry, with mild granular tissue and surrounding scab. There is no surrounding erythema, edema, drainage/purulence. The remaining nails appear unremarkable at this time. There are no other lesions or other signs of infection present.  Neurovascular status: Intact. No lower extremity swelling; No pain with calf compression bilateral.  Musculoskeletal: Decreased tenderness to palpation of the Medial hallux nail fold(s). Muscular strength within normal limits bilateral.   Assesement and Plan: S/p partial nail avulsion, doing well.   -Continue soaking in epsom salts twice a day followed by antibiotic ointment and a band-aid. Can leave uncovered at night. Continue this until completely healed.  -If the area has not healed in 2 weeks, call the office for follow-up appointment, or sooner if any problems arise.  -Monitor for any signs/symptoms of infection. Call the office immediately if any occur or go directly to the emergency room. Call with any questions/concerns.  Boneta Lucks, DPM

## 2019-04-03 NOTE — Telephone Encounter (Signed)
Patient calling- states she is having stomach issues. she called her PCP and they told her to get in with Dr. Loletha Carrow and if she couldn't get in she needed to go to the ED. I let her know Dr. Loletha Carrow next day is not until 2/24 but we could get her in this week with a PA and she stated she did not want to see them she wanted to talk to Dr. Loletha Carrow nurse about fitting her in.

## 2019-04-03 NOTE — Telephone Encounter (Signed)
Returned phone call to pt and she stated that she has been sick since this past Thursday and she is currently waiting on a call back from once of her other doctors. Since she has been sick, she has not been able to eat much and this has resulted in her blood sugars dropping. Pt stated that her blood sugars have been in the 90's for the last four days (fasting and non fasting). She stated that they are usually around 120-140. She sometimes gets symptomatic at these numbers. (shakiness) She would like to know if her insulin needs to be adjusted until she is feeling better.

## 2019-04-03 NOTE — Telephone Encounter (Signed)
Zofran and labs orders. Patient will be in today for labs. Patient aware of Zofran. Will be in on 1/27 to see [facility].

## 2019-04-03 NOTE — Telephone Encounter (Signed)
She has IBS and her flares of that typically behave this way.  Advise trying to have at least oral fluids to keep hydrated.    If needs new prescription for zofran, please send one for 4 mg every 6 hours as needed.  Disp# 30, RF 1  Have her come to lab today or tomorrow for CBC/diff and BMP.  If unable to keep down fluids and not urinating much, then recommend ED visit.  Even if we could see her urgently in office, we do not have the capabilities to administer medicines or IV fluids here.

## 2019-04-03 NOTE — Telephone Encounter (Signed)
error 

## 2019-04-03 NOTE — Telephone Encounter (Signed)
Called pt and left detailed voicemail with MD message. Urged pt to return call if she has any questions at all.

## 2019-04-03 NOTE — Telephone Encounter (Signed)
Spoke with the patient who reports on Thursday, sudden LLQ pain. Friday denies vomiting but endorse the start of N/D. Reports daily diarrhea, up to 5 times daily. She stopped eating because "it runs right through me." She has taken nothing for her symptoms. Hx of colitis and diverticulitis. Denies fever, endorses shakiness, dizziness, lightheadedness. Drinking water, reports urinating "more than normal." She has been scheduled with [facility] on 1/27 at 1:30 pm. She wanted to avoid going to the ED, only if Dr. Loletha Carrow felt is was necessary. Please advise on any recommendations if needed prior to patient's appt on 1/27?

## 2019-04-04 DIAGNOSIS — R197 Diarrhea, unspecified: Secondary | ICD-10-CM | POA: Diagnosis not present

## 2019-04-04 MED ORDER — DICYCLOMINE HCL 10 MG PO CAPS: 20.0000 mg | ORAL_CAPSULE | Freq: Three times a day (TID) | ORAL | 0 refills | Status: DC | PRN

## 2019-04-04 NOTE — Telephone Encounter (Signed)
Dr. Loletha Carrow, the patient is calling with continued diarrhea and cramping. Patient is requesting medication to relieve the cramping and diarrhea. Please advise.

## 2019-04-04 NOTE — Telephone Encounter (Signed)
Patient notified of script. Will be in tomorrow for her appt.

## 2019-04-04 NOTE — Telephone Encounter (Signed)
I refilled her dicyclomine at increased dose to 20 mg every 8 hours as needed.  She has an appointment in clinic tomorrow for further evaluation and recommendations.  Blood work was reassuring.  WBC normal, kidney function and electrolytes normal, so she is not dehydrated.

## 2019-04-04 NOTE — Telephone Encounter (Signed)
Pt reported that her symptoms of diarrhea, cramping and shaking are worse today.  Pt requested to speak to a nurse.

## 2019-04-05 ENCOUNTER — Other Ambulatory Visit: Payer: Self-pay

## 2019-04-05 ENCOUNTER — Ambulatory Visit (INDEPENDENT_AMBULATORY_CARE_PROVIDER_SITE_OTHER): Payer: Medicare Other | Admitting: Nurse Practitioner

## 2019-04-05 ENCOUNTER — Encounter: Payer: Self-pay | Admitting: Nurse Practitioner

## 2019-04-05 VITALS — BP 130/70 | HR 60 | Temp 97.8°F | Ht 62.0 in | Wt 158.4 lb

## 2019-04-05 DIAGNOSIS — R197 Diarrhea, unspecified: Secondary | ICD-10-CM | POA: Diagnosis not present

## 2019-04-05 DIAGNOSIS — R1084 Generalized abdominal pain: Secondary | ICD-10-CM

## 2019-04-05 NOTE — Progress Notes (Addendum)
04/05/2019 [name] [name] [id number] 10-28-1945   History of Present Illness: [name] [name] is a 74 year old female with a past medical history of anxiety, depression, fibromyalgia, diarrhea predominant IBS, ischemic colitis in 2012 and 2014 and sigmoid diverticulitis confirmed by CTAP 05/31/2018 which resolved after treatment with Augmentin and chronic LLQ abdominal pain.  She is S/P total left knee replacement surgery 02/07/2019. She developed constipation 03/30/2019, couldn't pass a bowel movement. She took a Dulcolax suppository of 1/22 then 20 minutes later she passed a hard stool then a mud like loose stool. No rectal bleeding or black stools. She developed generalized abdominal discomfort and cramping. No severe abdominal pain. She continued to have diarrhea for the next 3 days, 4 to 5 episodes daily. Her diarrhea has decreased over the past 2 days. Today, she passed to mud like stools which occurred shortly after eating. No rectal bleeding. She continues to have generalized abdominal pain. She contacted Dr. Loletha [name] 1/25 and her Dicyclomine was increased to 20mg  po tid.  Her abdominal pain is less today. She is drinking water and eating a bland diet. She is urinating clear yellow urine.  She was diagnosed with a UTI late Dec. 2020 and she was prescribed Cipro 250mg  po bid x 3 days. She continued to have urinary symptoms so she was prescribed a second antibiotic which she took for 5 or 7 days. She developed an infection to her right toe due to an ingrown nail and she was prescribed Keflex  500mg  po bid x 5 days by her PCP. She is not taking probiotics. She contacted her PCP and stool cultures were submitted to the lab yesterday, results pending. Labs 04/03/2019: WBC 8.9. Hg 13.5. HCT 42.4. MCV 88.3. PLT 269. Na 138. K 3.7. BUN 10. Cr. 0.90.   Colonoscopy 12/29/2016:  - Diverticulosis in the left colon. - Erythematous mucosa in the sigmoid colon. Biopsies showed benign   mucosa, no evidence of  colitis.  - The examination was otherwise normal on direct and retroflexion views. -  Recall colonoscopy 10 years   Current Medications, Allergies, Past Medical History, Past Surgical History, Family History and Social History were reviewed in [employer] record.  Abd/pelvic CT with contrast 01/26/2019: 1.  No acute intra-abdominal process. 2. Unchanged mild right hydronephrosis to the level of the UPJ.  Physical Exam: BP 130/70   Pulse 60   Temp 97.8 F (36.6 C)   Ht 5\' 2"  (1.575 m)   Wt 158 lb 6 oz (71.8 kg)   BMI 28.97 kg/m  General: Well developed 74 year old female in no acute distress Head: Normocephalic and atraumatic Eyes:  Sclerae anicteric, conjunctiva pink  Ears: Normal auditory acuity Lungs: Clear throughout to auscultation Heart: Regular rate and rhythm Abdomen: Soft, nondistended, mild generalized tenderness without rebound or guarding. No masses, no hepatomegaly Normal bowel sounds x 4 quads. Rectal: Deferred Musculoskeletal: Symmetrical with no gross deformities  Extremities: No edema  Neurological: Alert oriented x 4, grossly nonfocal Psychological:  Alert and cooperative. Normal mood and affect  Assessment and Recommendations:  64. 74 year old with diarrhea and generalized abdominal pain.  Rule out C. Diff as patient was recently on 2 antibiotics for a UTI and Keflex for a toe infection -Request a copy of stool cultures, including C.diff from Dr. Darien Ramus office. Results currently pending.  -Florastor probiotic 1 po bid x 2 to 4 week -Push fluids -Bland diet -Take Dicyclomine 10mg  two tabs before breakfast and dinner  for the next few days then PRN -Patient to call our office if her symptoms worsen   2. History of chronic LLQ abdominal pain, past diverticulitis

## 2019-04-05 NOTE — Patient Instructions (Signed)
If you are age 72 or older, your body mass index should be between 23-30. Your Body mass index is 28.97 kg/m. If this is out of the aforementioned range listed, please consider follow up with your Primary Care Provider.  If you are age 61 or younger, your body mass index should be between 19-25. Your Body mass index is 28.97 kg/m. If this is out of the aformentioned range listed, please consider follow up with your Primary Care Provider.    1. Please try Florastor probiotic 1 tablet by mouth twice a day for 2-4 weeks. 2. Push fluid intake. 3. Take dicyclomine prior to breakfast and dinner for the next 2-3 days 4. Request a copy of your stool culture report from [facility] when the results are back  Due to recent changes in healthcare laws, you may see the results of your imaging and laboratory studies on MyChart before your provider has had a chance to review them.  We understand that in some cases there may be results that are confusing or concerning to you. Not all laboratory results come back in the same time frame and the provider may be waiting for multiple results in order to interpret others.  Please give [place] 48 hours in order for your provider to thoroughly review all the results before contacting the office for clarification of your results.   Thank you for choosing [facility] Noralyn Pick, CRNP

## 2019-04-06 ENCOUNTER — Ambulatory Visit
Admission: RE | Admit: 2019-04-06 | Discharge: 2019-04-06 | Disposition: A | Discharge status: Home / Self Care | Payer: Medicare Other | Source: Ambulatory Visit | Attending: Family Medicine | Admitting: Family Medicine

## 2019-04-06 DIAGNOSIS — E041 Nontoxic single thyroid nodule: Secondary | ICD-10-CM

## 2019-04-08 ENCOUNTER — Other Ambulatory Visit: Payer: Self-pay | Admitting: Family Medicine

## 2019-04-08 DIAGNOSIS — Z1231 Encounter for screening mammogram for malignant neoplasm of breast: Secondary | ICD-10-CM

## 2019-04-10 ENCOUNTER — Telehealth: Payer: Self-pay | Admitting: Psychiatry

## 2019-04-10 ENCOUNTER — Telehealth: Payer: Self-pay | Admitting: Nurse Practitioner

## 2019-04-10 NOTE — Telephone Encounter (Signed)
Left message for patient to call back to the office;  

## 2019-04-10 NOTE — Telephone Encounter (Signed)
Patient returned call to the office- patient reports since Saturday she has been constipated (going from diarrhea to constipation)-she has not taken any antidiarrheals -took Bentyl (feels like it is helping) -reports some nausea (took Zofran) -denies fever, rectal bleeding, rectal pain -encouraged increased oral intake(already drinking lots of water and Gatorade) -not sure what she can eat since she was told to eat bland foods (not eaten any fruits or veggies) -abd pain has moved from LLQ to all across abd  Please advise

## 2019-04-10 NOTE — Telephone Encounter (Signed)
I called the patient. She stated PCP called her today stool cx were normal. No BM x 3 days. She used a dulcolax suppository today and passed a solid stool. Abd pain is much less, feels fairly comfortable. If no BM tomorrow she can add 1/2 dose miralax Q D as tolerated, if needed she can take 1 capful. Advance to regular diet. Ok to continue Dicyclomine bid as needed. She will call if her sx worsen.

## 2019-04-10 NOTE — Progress Notes (Signed)
____________________________________________________________  Attending physician addendum:  Thank you for sending this case to me. I have reviewed the entire note, and the outlined plan seems appropriate.  Recent Abx use seems to have caused a flare of her IBS symptoms.  Wilfrid Lund, MD  ____________________________________________________________

## 2019-04-10 NOTE — Telephone Encounter (Signed)
Pt reports that her tremors are worse.

## 2019-04-11 DIAGNOSIS — Z79899 Other long term (current) drug therapy: Secondary | ICD-10-CM | POA: Diagnosis not present

## 2019-04-12 LAB — COMPREHENSIVE METABOLIC PANEL
AG Ratio: 1.8 (calc) (ref 1.0–2.5)
ALT: 15 U/L (ref 6–29)
AST: 17 U/L (ref 10–35)
Albumin: 4.4 g/dL (ref 3.6–5.1)
Alkaline phosphatase (APISO): 100 U/L (ref 37–153)
BUN/Creatinine Ratio: 7 (calc) (ref 6–22)
BUN: 6 mg/dL — ABNORMAL LOW (ref 7–25)
CO2: 22 mmol/L (ref 20–32)
Calcium: 10.5 mg/dL — ABNORMAL HIGH (ref 8.6–10.4)
Chloride: 105 mmol/L (ref 98–110)
Creat: 0.91 mg/dL (ref 0.60–0.93)
Globulin: 2.4 g/dL (calc) (ref 1.9–3.7)
Glucose, Bld: 133 mg/dL — ABNORMAL HIGH (ref 65–99)
Potassium: 4.7 mmol/L (ref 3.5–5.3)
Sodium: 139 mmol/L (ref 135–146)
Total Bilirubin: 0.5 mg/dL (ref 0.2–1.2)
Total Protein: 6.8 g/dL (ref 6.1–8.1)

## 2019-04-12 LAB — VALPROIC ACID LEVEL: Valproic Acid Lvl: 67.6 mg/L (ref 50.0–100.0)

## 2019-04-12 LAB — LITHIUM LEVEL: Lithium Lvl: 0.6 mmol/L (ref 0.6–1.2)

## 2019-04-13 ENCOUNTER — Telehealth: Payer: Self-pay | Admitting: Nurse Practitioner

## 2019-04-13 NOTE — Telephone Encounter (Signed)
Please advise if you feel we should refill this patients dicyclomine?

## 2019-04-13 NOTE — Telephone Encounter (Signed)
Patient is calling- states that she is still having muscle spasms and she was given dicyclomine to take- states she is out and asking if she still needs to continue to take it. If so requesting a refill. She states the medication was helping.

## 2019-04-16 ENCOUNTER — Other Ambulatory Visit: Payer: Self-pay | Admitting: Psychiatry

## 2019-04-16 DIAGNOSIS — F5101 Primary insomnia: Secondary | ICD-10-CM

## 2019-04-17 MED ORDER — DICYCLOMINE HCL 10 MG PO CAPS: 20.0000 mg | ORAL_CAPSULE | Freq: Three times a day (TID) | ORAL | 1 refills | Status: DC | PRN

## 2019-04-17 NOTE — Telephone Encounter (Signed)
[name] [name], please refill Dicyclomine now with 1 additional refill thx

## 2019-04-17 NOTE — Telephone Encounter (Signed)
Refilled patients dicyclomine left message on voicemail to inform her.

## 2019-04-19 ENCOUNTER — Telehealth: Payer: Self-pay

## 2019-04-19 DIAGNOSIS — Z471 Aftercare following joint replacement surgery: Secondary | ICD-10-CM | POA: Diagnosis not present

## 2019-04-19 DIAGNOSIS — Z96652 Presence of left artificial knee joint: Secondary | ICD-10-CM | POA: Diagnosis not present

## 2019-04-19 DIAGNOSIS — M4180 Other forms of scoliosis, site unspecified: Secondary | ICD-10-CM | POA: Diagnosis not present

## 2019-04-19 DIAGNOSIS — M5136 Other intervertebral disc degeneration, lumbar region: Secondary | ICD-10-CM | POA: Diagnosis not present

## 2019-04-19 DIAGNOSIS — M545 Low back pain: Secondary | ICD-10-CM | POA: Diagnosis not present

## 2019-04-19 NOTE — Telephone Encounter (Signed)
Incoming fax from [facility] for approval of dicyclomine HCI 10 mg. This is good until 2--2022

## 2019-04-20 ENCOUNTER — Telehealth: Payer: Self-pay | Admitting: Endocrinology

## 2019-04-20 ENCOUNTER — Ambulatory Visit: Payer: Medicare Other | Attending: Internal Medicine

## 2019-04-20 ENCOUNTER — Other Ambulatory Visit: Payer: Self-pay | Admitting: Endocrinology

## 2019-04-20 DIAGNOSIS — Z23 Encounter for immunization: Secondary | ICD-10-CM | POA: Insufficient documentation

## 2019-04-20 MED ORDER — INSULIN LISPRO (1 UNIT DIAL) 100 UNIT/ML (KWIKPEN): PEN_INJECTOR | SUBCUTANEOUS | 0 refills | Status: DC

## 2019-04-20 NOTE — Telephone Encounter (Signed)
Patient called to advise that she got a steroid shot yesterday at her orthopedics office and will also will be taking a 12 day course of oral steroids - her blood sugar is already running in the 180's and she wanted to find out how to handle the increase in her blood sugars

## 2019-04-20 NOTE — Telephone Encounter (Signed)
Called pt and gave her MD message. Pt verbalized understanding but also stated that she is only taking 40 units of Lantus daily. She then stated that in addition to the injection, she was placed on PO prednisone for the next 12 days.

## 2019-04-20 NOTE — Telephone Encounter (Signed)
She needs to go up on her Lantus by 10 units

## 2019-04-20 NOTE — Telephone Encounter (Signed)
She had increased her Lantus to 64 units instead of 54. She will likely need Humalog to be taken with each meal, 10 units also. If she needs a new prescription we can send for the Humalog pen unless she prefers a syringe then she can use the insulin vial

## 2019-04-20 NOTE — Progress Notes (Signed)
   Covid-[facility] Vaccination [facility]  Name:  Dnylah Mathes    MRN: [id number] DOB: September 26, 1945  04/20/2019  Ms. Michalak was observed post Covid-19 immunization for 15 minutes without incidence. She was provided with Vaccine Information Sheet and instruction to access the V-Safe system.   Ms. Rope was instructed to call 911 with any severe reactions post vaccine: Marland Kitchen Difficulty breathing  . Swelling of your face and throat  . A fast heartbeat  . A bad rash all over your body  . Dizziness and weakness    Immunizations Administered    Name Date Dose VIS Date Route   Pfizer COVID-19 Vaccine 04/20/2019  8:34 AM 0.3 mL 02/17/2019 Intramuscular   Manufacturer: [employer]   Lot: [id number]   [place]: [id number]

## 2019-04-20 NOTE — Telephone Encounter (Signed)
I called the patient about her Lantus dose and prescribed Humalog

## 2019-04-21 NOTE — Telephone Encounter (Signed)
Noted  

## 2019-04-23 ENCOUNTER — Other Ambulatory Visit: Payer: Self-pay | Admitting: Endocrinology

## 2019-04-25 ENCOUNTER — Ambulatory Visit (INDEPENDENT_AMBULATORY_CARE_PROVIDER_SITE_OTHER): Payer: Medicare Other | Admitting: Psychiatry

## 2019-04-25 ENCOUNTER — Encounter: Payer: Self-pay | Admitting: Psychiatry

## 2019-04-25 DIAGNOSIS — F319 Bipolar disorder, unspecified: Secondary | ICD-10-CM

## 2019-04-25 DIAGNOSIS — F5101 Primary insomnia: Secondary | ICD-10-CM

## 2019-04-25 DIAGNOSIS — F411 Generalized anxiety disorder: Secondary | ICD-10-CM | POA: Diagnosis not present

## 2019-04-25 MED ORDER — MEMANTINE HCL 10 MG PO TABS: ORAL_TABLET | ORAL | 0 refills | Status: DC

## 2019-04-25 MED ORDER — ZALEPLON 5 MG PO CAPS: ORAL_CAPSULE | ORAL | 1 refills | Status: DC
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
## 2019-04-26 ENCOUNTER — Other Ambulatory Visit: Payer: Self-pay

## 2019-04-26 ENCOUNTER — Other Ambulatory Visit (INDEPENDENT_AMBULATORY_CARE_PROVIDER_SITE_OTHER): Payer: Medicare Other

## 2019-04-26 DIAGNOSIS — E039 Hypothyroidism, unspecified: Secondary | ICD-10-CM

## 2019-04-26 DIAGNOSIS — Z794 Long term (current) use of insulin: Secondary | ICD-10-CM | POA: Diagnosis not present

## 2019-04-26 DIAGNOSIS — E1165 Type 2 diabetes mellitus with hyperglycemia: Secondary | ICD-10-CM

## 2019-04-26 LAB — COMPREHENSIVE METABOLIC PANEL
ALT: 12 U/L (ref 0–35)
AST: 13 U/L (ref 0–37)
Albumin: 4.4 g/dL (ref 3.5–5.2)
Alkaline Phosphatase: 102 U/L (ref 39–117)
BUN: 15 mg/dL (ref 6–23)
CO2: 29 mEq/L (ref 19–32)
Calcium: 10.5 mg/dL (ref 8.4–10.5)
Chloride: 103 mEq/L (ref 96–112)
Creatinine, Ser: 0.88 mg/dL (ref 0.40–1.20)
GFR: 63.3 mL/min (ref >60.00)
Glucose, Bld: 114 mg/dL — ABNORMAL HIGH (ref 70–99)
Potassium: 4.5 mEq/L (ref 3.5–5.1)
Sodium: 137 mEq/L (ref 135–145)
Total Bilirubin: 0.4 mg/dL (ref 0.2–1.2)
Total Protein: 7 g/dL (ref 6.0–8.3)

## 2019-04-26 LAB — TSH: TSH: 1.02 u[IU]/mL (ref 0.35–4.50)

## 2019-04-26 LAB — T4, FREE: Free T4: 1.01 ng/dL (ref 0.60–1.60)

## 2019-04-26 LAB — T3, FREE: T3, Free: 2.7 pg/mL (ref 2.3–4.2)

## 2019-04-26 LAB — HEMOGLOBIN A1C: Hgb A1c MFr Bld: 6.7 % — ABNORMAL HIGH (ref 4.6–6.5)

## 2019-04-27 ENCOUNTER — Other Ambulatory Visit: Payer: Medicare Other

## 2019-04-27 ENCOUNTER — Other Ambulatory Visit: Payer: Self-pay | Admitting: Endocrinology

## 2019-05-01 ENCOUNTER — Other Ambulatory Visit: Payer: Self-pay | Admitting: Endocrinology

## 2019-05-02 ENCOUNTER — Ambulatory Visit (INDEPENDENT_AMBULATORY_CARE_PROVIDER_SITE_OTHER): Payer: Medicare Other | Admitting: Endocrinology

## 2019-05-02 ENCOUNTER — Other Ambulatory Visit: Payer: Self-pay

## 2019-05-02 ENCOUNTER — Encounter: Payer: Self-pay | Admitting: Endocrinology

## 2019-05-02 DIAGNOSIS — E782 Mixed hyperlipidemia: Secondary | ICD-10-CM | POA: Diagnosis not present

## 2019-05-02 DIAGNOSIS — Z794 Long term (current) use of insulin: Secondary | ICD-10-CM

## 2019-05-02 DIAGNOSIS — E1165 Type 2 diabetes mellitus with hyperglycemia: Secondary | ICD-10-CM

## 2019-05-02 DIAGNOSIS — E039 Hypothyroidism, unspecified: Secondary | ICD-10-CM | POA: Diagnosis not present

## 2019-05-02 NOTE — Progress Notes (Signed)
Patient ID: [name] [name], female   DOB: 1945-10-27, 74 y.o.   MRN: [id number]            Reason for Appointment: Follow-up for Type 2 Diabetes  Today's office visit was provided via telemedicine using a telephone call to the patient Patient has been explained the limitations of evaluation and management by telemedicine and the availability of in person appointments.  The patient understood the limitations and agreed to proceed. Patient also understood that the telehealth visit is billable. . Location of the patient: Home . Location of the provider: Office Only the patient and myself were participating in the encounter    History of Present Illness:          Date of diagnosis of type 2 diabetes mellitus: ?  2014        Background history:   She thinks her blood sugar was 400 at the time of diagnosis but no detailed records of this are available She did have an A1c of 10.6 done in 2014 and was probably given Lantus for some time initially Apparently she was treated with various medications including metformin, Janumet and Tradgenta. Her blood sugars had improved and A1c in 2015 was down to 6.2 She tends to have diarrhea with metformin and Janumet and also she thinks it causes dry mouth Most likely Janumet was stopped in 06/2015 Glipizide was started in 8/17 when blood sugars were higher and A1c was 9%  Recent history:   INSULIN regimen is:   Lantus 50 units daily at 5 pm  Humalog: taking 10 units before meals  Non-insulin hypoglycemic drugs the patient is taking are: 0.5 mg Ozempic weekly  Her A1c is 6.7, higher than the 5.7 she had   Current management, blood sugar patterns and problems identified:  Her blood sugars were reviewed from her meter at home  She got a steroid injection and Medrol Dosepak earlier this month and her blood sugars started going up significantly  She had been taking 40 units of Lantus since 1/21 when her appetite had gone down and insulin was  reduced along with Ozempic  Since blood sugars are significantly high she is now taking 50 units of Lantus  However she still has relatively high fasting readings which she is taking sporadically  She was also told to take 10 units of Humalog with each meal although she may not remember to take this with every meal  Blood sugar is now nonfasting are somewhat variable with mostly high readings except for on 2/19  She says her appetite is significantly increased with taking the steroids  She has taken only half the dose of Ozempic although not clear if she is using 1 mg or the 0.5 mg pen currently  No hypoglycemia   Side effects from medications have been:?  Diarrhea from metformin and Janumet  Compliance with the medical regimen: Inconsistent  Glucose monitoring:  done 1 time a day or less        Glucometer: Contour        Blood Glucose readings    PRE-MEAL Fasting Lunch Dinner  overnight Overall  Glucose range:  157-181   126, 203  122, 247   Mean/median:     ?   POST-MEAL PC Breakfast PC Lunch PC Dinner  Glucose range:    157-214  Mean/median:      Previous readings:  PRE-MEAL  early a.m. Lunch Dinner Bedtime Overall  Glucose range:  93-172    128-157  Mean/median:      141    Self-care: The diet that the patient has been following [id number], tries to limit drinks with sugar .      Typical meal intake: Breakfast at 9,  May be eggs and sausage and lunch is usually a sandwich   Dinner 7 pm               Dietician visit, most recent: 10/18                Weight history:   Wt Readings from Last 3 Encounters:  04/05/19 158 lb 6 oz (71.8 kg)  02/07/19 160 lb 0.9 oz (72.6 kg)  02/01/19 162 lb (73.5 kg)    Glycemic control:   Lab Results  Component Value Date   HGBA1C 6.7 (H) 04/26/2019   HGBA1C 5.7 (H) 02/01/2019   HGBA1C 6.3 10/25/2018   Lab Results  Component Value Date   MICROALBUR <0.7 05/04/2018   CREATININE 0.88 04/26/2019   Lab Results  Component  Value Date   MICRALBCREAT 1.9 05/04/2018       Allergies as of 05/02/2019      Reactions   Codeine Anxiety, Other (See Comments)   Hallucinations, tolerates oxycodone    Macrolides And Ketolides Nausea And Vomiting   Procaine Hcl Palpitations   Aspirin Nausea And Vomiting, Other (See Comments)   Reaction:  Burns pts stomach    Benadryl [diphenhydramine] Other (See Comments)   Per MD "inhibits potency of gabapentin, lithium etc"   Diflucan [fluconazole] Other (See Comments)   Unknown reaction   Dilaudid [hydromorphone Hcl] Other (See Comments)   Migraines and nightmares    Sulfa Antibiotics Other (See Comments)   Unknown reaction      Medication List       Accurate as of May 02, 2019  2:10 PM. If you have any questions, ask your nurse or doctor.        STOP taking these medications   predniSONE 5 MG tablet Commonly known as: DELTASONE Stopped by: Elayne Snare, MD     TAKE these medications   acetaminophen 500 MG tablet Commonly known as: TYLENOL Take 1,000 mg by mouth 3 (three) times daily.   ALPRAZolam 0.5 MG tablet Commonly known as: XANAX Take 1 tablet (0.5 mg total) by mouth 4 (four) times daily as needed for anxiety.   amoxicillin 500 MG capsule Commonly known as: AMOXIL amoxicillin 500 mg capsule  take 4 capsules by mouth 1 HOUR PRIOR TO DENTAL APPOINTMENT   atorvastatin 40 MG tablet Commonly known as: LIPITOR Take 40 mg by mouth at bedtime.   BD Pen Needle Nano U/F 32G X 4 MM Misc Generic drug: Insulin Pen Needle USE TO INJECT 5 TIMES DAILY AS DIRECTED   celecoxib 200 MG capsule Commonly known as: CELEBREX Take 200 mg by mouth at bedtime.   dicyclomine 10 MG capsule Commonly known as: BENTYL Take 2 capsules (20 mg total) by mouth every 8 (eight) hours as needed for spasms.   diphenoxylate-atropine 2.5-0.025 MG tablet Commonly known as: Lomotil Take 1 tablet by mouth 2 (two) times daily as needed for diarrhea or loose stools.   divalproex  250 MG 24 hr tablet Commonly known as: DEPAKOTE ER TAKE 2 TABLETS(500 MG) BY MOUTH AT BEDTIME What changed: See the new instructions.   docusate sodium 100 MG capsule Commonly known as: Colace Take 1 capsule (100 mg total) by mouth 2 (two) times daily.   donepezil 10 MG tablet Commonly  known as: ARICEPT Take 1 tablet at Bedtime What changed:   how much to take  how to take this  when to take this  additional instructions   ferrous sulfate 325 (65 FE) MG tablet Commonly known as: FerrouSul Take 1 tablet (325 mg total) by mouth 3 (three) times daily with meals for 14 days.   fluticasone 50 MCG/ACT nasal spray Commonly known as: FLONASE Place 1 spray into both nostrils daily as needed for allergies or rhinitis.   furosemide 20 MG tablet Commonly known as: LASIX furosemide 20 mg tablet  TAKE 1 TABLET BY MOUTH EVERY DAY   gabapentin 400 MG capsule Commonly known as: NEURONTIN Take 400 mg by mouth 3 (three) times daily.   glucose blood test strip Commonly known as: Contour Next Test Use to test blood sugar 2 times daily   HumaLOG KwikPen 100 UNIT/ML KwikPen Generic drug: insulin lispro INJECT 10 TO 14 UNITS THREE TIMES DAILY FOR BLOOD SUGARS OVER 180   hyoscyamine 0.375 MG 12 hr tablet Commonly known as: LEVBID hyoscyamine ER 0.375 mg tablet,extended release,12 hr  take 1 tablet by mouth once daily BEFORE BREAKFAST   Lantus SoloStar 100 UNIT/ML Solostar Pen Generic drug: Insulin Glargine Inject 54 units under the skin once daily. What changed:   how much to take  additional instructions   levothyroxine 75 MCG tablet Commonly known as: SYNTHROID TAKE 1 TABLET BY MOUTH DAILY BEFORE BREAKFAST   lithium carbonate 450 MG CR tablet Commonly known as: ESKALITH Take 1 tablet (450 mg total) by mouth at bedtime.   loperamide 2 MG tablet Commonly known as: IMODIUM A-D Take 2 mg by mouth 2 (two) times daily as needed for diarrhea or loose stools.   losartan 50  MG tablet Commonly known as: COZAAR Take 25 mg by mouth daily.   Melatonin 10 MG Tbcr Take 10 mg by mouth at bedtime.   memantine 10 MG tablet Commonly known as: NAMENDA TAKE 1 TABLET(10 MG) BY MOUTH EVERY EVENING   Mesalamine 800 MG Tbec Take 2 tablets (1,600 mg total) by mouth 3 (three) times daily for 3 days. What changed:   when to take this  reasons to take this   methocarbamol 500 MG tablet Commonly known as: Robaxin Take 1 tablet (500 mg total) by mouth every 6 (six) hours as needed for muscle spasms.   Microlet Lancets Misc USE DAILY AS DIRECTED   nitroGLYCERIN 0.4 MG SL tablet Commonly known as: NITROSTAT Place 1 tablet (0.4 mg total) under the tongue every 5 (five) minutes as needed for chest pain.   ondansetron 4 MG disintegrating tablet Commonly known as: Zofran ODT 4mg  ODT q4 hours prn nausea/vomit What changed:   how much to take  how to take this  when to take this  reasons to take this  additional instructions   ondansetron 4 MG tablet Commonly known as: ZOFRAN Take 1 tablet (4 mg total) by mouth every 6 (six) hours as needed for nausea or vomiting.   oxyCODONE 5 MG immediate release tablet Commonly known as: Oxy IR/ROXICODONE Take 1-3 tablets (5-15 mg total) by mouth every 4 (four) hours as needed for moderate pain or severe pain.   Ozempic (0.25 or 0.5 MG/DOSE) 2 MG/1.5ML Sopn Generic drug: Semaglutide(0.25 or 0.5MG /DOS) INJECT 0.5 MG INTO THE SKIN ONCE WEEKLY What changed: Another medication with the same name was removed. Continue taking this medication, and follow the directions you see here. Changed by: Elayne Snare, MD   polyethylene glycol  17 g packet Commonly known as: MIRALAX / GLYCOLAX Take 17 g by mouth 2 (two) times daily.   QUEtiapine 200 MG tablet Commonly known as: SEROQUEL TAKE 1 TAB PO q 5 pm and 1 tab po QHS   rivaroxaban 10 MG Tabs tablet Commonly known as: Xarelto Take 1 tablet (10 mg total) by mouth daily.    rOPINIRole 0.5 MG tablet Commonly known as: REQUIP Take 3 tablets (1.5 mg total) by mouth 3 (three) times daily.   terconazole 0.4 % vaginal cream Commonly known as: TERAZOL 7 terconazole 0.4 % vaginal cream  APPLY TO THE AFFECTED AREA ONCE DAILY FOR 7 DAYS   triamcinolone cream 0.1 % Commonly known as: KENALOG triamcinolone acetonide 0.1 % topical cream  APPLY TO LEFT LEG TWICE DAILY   Vitamin D 50 MCG (2000 UT) tablet Take 2,000 Units by mouth daily.   zaleplon 5 MG capsule Commonly known as: SONATA TAKE 1 CAPSULE  BY MOUTH AT BEDTIME       Allergies:  Allergies  Allergen Reactions  . Codeine Anxiety and Other (See Comments)    Hallucinations, tolerates oxycodone   . Macrolides And Ketolides Nausea And Vomiting  . Procaine Hcl Palpitations  . Aspirin Nausea And Vomiting and Other (See Comments)    Reaction:  Burns pts stomach   . Benadryl [Diphenhydramine] Other (See Comments)    Per MD "inhibits potency of gabapentin, lithium etc"  . Diflucan [Fluconazole] Other (See Comments)    Unknown reaction   . Dilaudid [Hydromorphone Hcl] Other (See Comments)    Migraines and nightmares   . Sulfa Antibiotics Other (See Comments)    Unknown reaction    Past Medical History:  Diagnosis Date  . Alcohol abuse   . Anxiety    takes Valium daily as needed and Ativan daily  . Bilateral hearing loss   . Bipolar 1 disorder ([place])    takes Lithium nightly and Synthroid daily  . Chronic back pain    DDD; "all over" (09/14/2017)  . Colitis, ischemic ([place]) 2012  . Confusion    r/t meds  . Depression    takes Prozac daily and Bupspirone   . Diverticulosis   . Dyslipidemia    takes Crestor daily  . Fibromyalgia   . Headache    "weekly" (09/14/2017)  . Hepatic steatosis 06/18/12   severe  . Hyperlipidemia   . Hypertension   . Hypothyroidism   . IBS (irritable bowel syndrome)   . Ischemic colitis ([place])   . Joint pain   . Joint swelling   . Lupus erythematosus tumidus     tumid-skin  . Migraine    "1-2/yr; maybe" (09/14/2017)  . Numbness    in right foot  . Osteoarthritis    "all over" (09/14/2017)  . Osteoarthritis cervical spine   . Osteoarthritis of hand    bilateral  . Pneumonia    "walking pneumonia several times; long time since the last time" (09/14/2017)  . Restless leg syndrome    takes Requip nightly  . Sciatica   . Type II diabetes mellitus ([place])   . Urinary frequency   . Urinary leakage   . Urinary urgency   . Urinary, incontinence, stress female   . Walking pneumonia    last time more than 42yrs ago    Past Surgical History:  Procedure Laterality Date  . ABDOMINAL HYSTERECTOMY     "they left my ovaries"  . APPENDECTOMY    . CARDIAC CATHETERIZATION  09/14/2017  .  COLONOSCOPY    . DENTAL SURGERY Left 10/2016   dental implant  . DILATION AND CURETTAGE OF UTERUS  X 4  . ESOPHAGOGASTRODUODENOSCOPY    . FLEXIBLE SIGMOIDOSCOPY N/A 06/21/2012   Procedure: FLEXIBLE SIGMOIDOSCOPY;  Surgeon: Jerene Bears, MD;  Location: [facility];  Service: Gastroenterology;  Laterality: N/A;  . JOINT REPLACEMENT    . LEFT HEART CATH AND CORONARY ANGIOGRAPHY N/A 09/14/2017   Procedure: LEFT HEART CATH AND CORONARY ANGIOGRAPHY;  Surgeon: Belva Crome, MD;  Location: [facility];  Service: Cardiovascular;  Laterality: N/A;  . SHOULDER ARTHROSCOPY Right    "shaved spurs off rotator cuff"  . TONSILLECTOMY    . TOTAL HIP ARTHROPLASTY Right 06/09/2013   Procedure: TOTAL HIP ARTHROPLASTY;  Surgeon: Kerin Salen, MD;  Location: [facility];  Service: Orthopedics;  Laterality: Right;  . TOTAL KNEE ARTHROPLASTY Left 02/07/2019   Procedure: TOTAL KNEE ARTHROPLASTY;  Surgeon: Paralee Cancel, MD;  Location: [facility];  Service: Orthopedics;  Laterality: Left;  70 mins  . TUBAL LIGATION    . TUMOR EXCISION Right 1968   angle of jaw; benign    Family History  Problem Relation Age of Onset  . Drug abuse Mother   . Alcohol abuse Mother   . Alcohol abuse Father   .  Hypertension Father   . CAD Brother   . Hypertension Brother   . Alcohol abuse Brother   . Hypertension Brother   . Hypertension Brother   . Psoriasis Daughter   . Arthritis Daughter        psoriatic arthritis   . Alcohol abuse Grandchild     Social History:  reports that she has never smoked. She has never used smokeless tobacco. She reports previous alcohol use. She reports previous drug use. Frequency: 7.00 times per week. Drug: Benzodiazepines.   Review of Systems    Lipid history: On Lipitor 40 mg prescribed by her PCP Last LDL was high and just above 100  Triglycerides last over 200  On fish oil ? 2 daily   Lab Results  Component Value Date   CHOL 178 05/04/2018   HDL 64.40 05/04/2018   LDLDIRECT 103.0 05/04/2018   TRIG 240.0 (H) 05/04/2018   CHOLHDL 3 05/04/2018            Most recent eye exam was In 03/2018, negative  Most recent foot exam:03/2016  THYROID: She has been on levothyroxine and Cytomel from her psychiatrist for several years with uncertain diagnosis She is taking levothyroxine 75 g, with Cytomel 25 g  Also on lithium  Her TSH is fairly consistently near 1.0  Free T3 and free T4 are normal  Labs as follows:  Lab Results  Component Value Date   TSH 1.02 04/26/2019   TSH 1.35 07/04/2018   TSH 1.09 12/21/2017   FREET4 1.01 04/26/2019   FREET4 0.77 07/04/2018   FREET4 0.65 12/21/2017   Lab Results  Component Value Date   T3FREE 2.7 04/26/2019   T3FREE 2.5 07/04/2018   T3FREE 2.3 12/21/2017   T3FREE 3.0 03/22/2017   T3FREE 3.2 07/10/2016    She has atypical depression on multiple drugs, has been on Seroquel for quite some time  THYROID nodule:  She was found to have a nodule in her thyroid isthmus in August Ultrasound done in 1/21 showed multinodular goiter with only 1 significant nodule as below  Right mid thyroid nodule (labeled 2, TR 3) meets criteria for surveillance  Previously had mild hypercalcemia,  last levels: Not on  HCTZ but is on lithium  Lab Results  Component Value Date   PTH 35 10/25/2018   CALCIUM 10.5 04/26/2019   CAION 1.30 07/14/2016   PHOS 2.9 12/26/2010     Physical Examination:  There were no vitals taken for this visit.  No exam done, patient seen on virtual visit    ASSESSMENT:  Diabetes type 2, on insulin  See history of present illness for detailed discussion of current diabetes management, blood sugar patterns and problems identified  She is on Ozempic and Lantus, and recently also on Humalog  Her A1c is slightly higher at 6.7 although previously was down to 5.7  As discussed above her blood sugars are not available for detailed evaluation especially prior to her getting steroids this month Also because of variable appetite blood sugars have been inconsistent Unlikely that Ozempic has had significant effect on her previous weight loss, nausea and decreased appetite since she had been taking this since 2018 and 1 mg since 05/2018  Also recently even with finishing steroids her blood sugars are variable and periodically over 200  Probable secondary hypothyroidism: Free T4, T3 and TSH normal  She is on a stable combination of Cytomel and levothyroxine as before and will continue  Ultrasound shows only insignificant multinodular goiter and this was discussed  HYPERCALCEMIA: Calcium is not increased now and upper normal Likely needs bone density   PLAN:  She will go back on 1 mg Ozempic since she is having increased appetite now This may also help her sugars especially reducing the variability Lantus will be increased to 54 She will let us know if she has blood sugars below 120 and at that time likely will need to reduce all insulin doses 2 to 4 units In the meantime she can start reducing her Humalog by 2 units and only take 8  Recommended that since she is having less back and sciatica pain she should start walking for exercise    Follow-up in 3 months  There  are no Patient Instructions on file for this visit.     Duration of telephone encounter =8 minutes    Elayne Snare 05/02/2019, 2:10 PM   Note: This office note was prepared with Dragon voice recognition system technology. Any transcriptional errors that result from this process are unintentional.

## 2019-05-05 ENCOUNTER — Telehealth: Payer: Self-pay | Admitting: Gastroenterology

## 2019-05-05 ENCOUNTER — Telehealth: Payer: Self-pay | Admitting: Endocrinology

## 2019-05-05 ENCOUNTER — Other Ambulatory Visit: Payer: Self-pay

## 2019-05-05 MED ORDER — BD PEN NEEDLE NANO U/F 32G X 4 MM MISC: 0 refills | Status: DC

## 2019-05-05 MED ORDER — HUMALOG KWIKPEN 100 UNIT/ML ~~LOC~~ SOPN: PEN_INJECTOR | SUBCUTANEOUS | 1 refills | Status: DC

## 2019-05-05 MED ORDER — LANTUS SOLOSTAR 100 UNIT/ML ~~LOC~~ SOPN: PEN_INJECTOR | SUBCUTANEOUS | 2 refills | Status: DC

## 2019-05-05 NOTE — Telephone Encounter (Signed)
Prior auth request has been sent to [facility].

## 2019-05-05 NOTE — Telephone Encounter (Signed)
Dr Loletha Carrow please advise. The out of pocket cost for her is $119.  I tried to do the PA and have been denied

## 2019-05-05 NOTE — Telephone Encounter (Signed)
Rx sent 

## 2019-05-05 NOTE — Telephone Encounter (Signed)
MEDICATION: Pen Needles uses fo Lantus and Humalog  PHARMACY:  [facility] on the corner of [address] :   IS PATIENT OUT OF MEDICATION: no  IF NOT; HOW MUCH IS LEFT: 3-5 pen needles left/uses 4-5 a day  LAST APPOINTMENT DATE: @2 /23/2021  NEXT APPOINTMENT DATE:@Visit  date not found  DO WE HAVE YOUR PERMISSION TO LEAVE A DETAILED MESSAGE:  OTHER COMMENTS:    **Let patient know to contact pharmacy at the end of the day to make sure medication is ready. **  ** Please notify patient to allow 48-72 hours to process**  **Encourage patient to contact the pharmacy for refills or they can request refills through [facility]**

## 2019-05-05 NOTE — Telephone Encounter (Signed)
Liana from [facility] called to inform that PA for ondansetron has been denied due to "non-FDA approved use."

## 2019-05-07 DIAGNOSIS — R0981 Nasal congestion: Secondary | ICD-10-CM | POA: Diagnosis not present

## 2019-05-07 DIAGNOSIS — J069 Acute upper respiratory infection, unspecified: Secondary | ICD-10-CM | POA: Diagnosis not present

## 2019-05-07 DIAGNOSIS — R52 Pain, unspecified: Secondary | ICD-10-CM | POA: Diagnosis not present

## 2019-05-08 DIAGNOSIS — M545 Low back pain: Secondary | ICD-10-CM | POA: Diagnosis not present

## 2019-05-08 DIAGNOSIS — Z96652 Presence of left artificial knee joint: Secondary | ICD-10-CM | POA: Diagnosis not present

## 2019-05-08 NOTE — Telephone Encounter (Signed)
Her insurance declined to cover this medicine despite prior authorization attempt.  Other available anti-emetics are likely to be too sedating in conjunction with her other medicines.  Can try some IB Gard  or some peppermint oil to reduce nausea.

## 2019-05-09 ENCOUNTER — Telehealth: Payer: Self-pay | Admitting: Psychiatry

## 2019-05-09 NOTE — Telephone Encounter (Signed)
Left message for patient to call back  

## 2019-05-09 NOTE — Telephone Encounter (Signed)
Pt called to ask about taking advil with other meds she is taking, Please call @ [phone]

## 2019-05-10 ENCOUNTER — Other Ambulatory Visit: Payer: Self-pay

## 2019-05-10 MED ORDER — ONDANSETRON HCL 4 MG PO TABS: 4.0000 mg | ORAL_TABLET | Freq: Four times a day (QID) | ORAL | 1 refills | Status: DC | PRN

## 2019-05-10 NOTE — Telephone Encounter (Signed)
Patient has been notified of the insurance denial  I talked to her about using the GoodRx card with [employer] member program. New Rx has been sent

## 2019-05-10 NOTE — Telephone Encounter (Signed)
Unable to reach patient but was able to leave a message to call back to discuss.

## 2019-05-10 NOTE — Telephone Encounter (Signed)
Tried to reach patient, no answer and no voicemail. Will try back later

## 2019-05-10 NOTE — Telephone Encounter (Signed)
Patient is returning your call.  

## 2019-05-12 ENCOUNTER — Ambulatory Visit (INDEPENDENT_AMBULATORY_CARE_PROVIDER_SITE_OTHER): Payer: Medicare Other | Admitting: Psychiatry

## 2019-05-12 ENCOUNTER — Encounter: Payer: Self-pay | Admitting: Psychiatry

## 2019-05-12 DIAGNOSIS — F5101 Primary insomnia: Secondary | ICD-10-CM

## 2019-05-12 DIAGNOSIS — F411 Generalized anxiety disorder: Secondary | ICD-10-CM | POA: Diagnosis not present

## 2019-05-12 DIAGNOSIS — Z79899 Other long term (current) drug therapy: Secondary | ICD-10-CM | POA: Diagnosis not present

## 2019-05-12 DIAGNOSIS — F319 Bipolar disorder, unspecified: Secondary | ICD-10-CM

## 2019-05-12 MED ORDER — DAYVIGO 5 MG PO TABS: 5.0000 mg | ORAL_TABLET | Freq: Every day | ORAL | 0 refills | Status: DC
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
## 2019-05-16 ENCOUNTER — Telehealth: Payer: Self-pay | Admitting: Psychiatry

## 2019-05-16 NOTE — Telephone Encounter (Signed)
Left patient detailed message and to call back with further questions

## 2019-05-16 NOTE — Telephone Encounter (Signed)
Pt started on new sleeping pill last week and had very bad sideffects from it. Pt wants to know if there is something else she can take. Please send to [facility] on [address].

## 2019-05-22 ENCOUNTER — Other Ambulatory Visit: Payer: Self-pay

## 2019-05-22 MED ORDER — HUMALOG KWIKPEN 100 UNIT/ML ~~LOC~~ SOPN: PEN_INJECTOR | SUBCUTANEOUS | 1 refills | Status: DC

## 2019-05-23 DIAGNOSIS — Z79899 Other long term (current) drug therapy: Secondary | ICD-10-CM | POA: Diagnosis not present

## 2019-05-24 ENCOUNTER — Telehealth: Payer: Self-pay | Admitting: Psychiatry

## 2019-05-24 LAB — LITHIUM LEVEL: Lithium Lvl: 0.6 mmol/L (ref 0.6–1.2)

## 2019-05-24 NOTE — Telephone Encounter (Signed)
Patient aware see lab note

## 2019-05-24 NOTE — Telephone Encounter (Signed)
Pt would like a call back to go over Lithium results.

## 2019-05-24 NOTE — Addendum Note (Signed)
Addended by: Sharyl Nimrod on: 05/24/2019 08:41 AM   Modules accepted: Orders

## 2019-05-25 DIAGNOSIS — R1310 Dysphagia, unspecified: Secondary | ICD-10-CM | POA: Diagnosis not present

## 2019-05-25 DIAGNOSIS — K224 Dyskinesia of esophagus: Secondary | ICD-10-CM | POA: Diagnosis not present

## 2019-05-25 DIAGNOSIS — R519 Headache, unspecified: Secondary | ICD-10-CM | POA: Diagnosis not present

## 2019-05-29 ENCOUNTER — Emergency Department (HOSPITAL_COMMUNITY)
Admission: EM | Admit: 2019-05-29 | Discharge: 2019-05-29 | Disposition: A | Discharge status: Home / Self Care | Payer: Medicare Other | Attending: Emergency Medicine | Admitting: Emergency Medicine

## 2019-05-29 ENCOUNTER — Telehealth: Payer: Self-pay | Admitting: Psychiatry

## 2019-05-29 ENCOUNTER — Encounter (HOSPITAL_COMMUNITY): Payer: Self-pay | Admitting: Emergency Medicine

## 2019-05-29 DIAGNOSIS — G479 Sleep disorder, unspecified: Secondary | ICD-10-CM | POA: Diagnosis not present

## 2019-05-29 DIAGNOSIS — Z5321 Procedure and treatment not carried out due to patient leaving prior to being seen by health care provider: Secondary | ICD-10-CM | POA: Insufficient documentation

## 2019-05-29 NOTE — Telephone Encounter (Signed)
Patient has called and said that now she is in crisis. She is manic and having racing thoughts. She can't sleep and is not doing well. Please give her a call at [phone]. She has an appt tomorrow 3/23

## 2019-05-29 NOTE — Telephone Encounter (Signed)
Pt called stated she knows she has appt tomorrow but she needs to talk to somebody today. [name] [name] always told her to call any time. Aware [name] [name] is gone for the day, but don't know what to do. Do I go to the hospital?  She can't sleep, bipolar is acting up and need to talk to someone today. [phone]

## 2019-05-29 NOTE — ED Notes (Signed)
Patient reports "I just can't sit still and I think I want to go home." Patient alert and oriented. Ambulatory out of department without difficulty in NAD.

## 2019-05-29 NOTE — ED Triage Notes (Signed)
Patient reports hx bipolar. States difficulty sleeping and concentrating x3 weeks. States "I am in a manic phase and my mind just will not stop." Reports taking medications as prescribed.

## 2019-05-30 ENCOUNTER — Encounter: Payer: Self-pay | Admitting: Psychiatry

## 2019-05-30 ENCOUNTER — Ambulatory Visit (INDEPENDENT_AMBULATORY_CARE_PROVIDER_SITE_OTHER): Payer: Medicare Other | Admitting: Psychiatry

## 2019-05-30 DIAGNOSIS — F319 Bipolar disorder, unspecified: Secondary | ICD-10-CM

## 2019-05-30 DIAGNOSIS — F411 Generalized anxiety disorder: Secondary | ICD-10-CM | POA: Diagnosis not present

## 2019-05-30 DIAGNOSIS — F5101 Primary insomnia: Secondary | ICD-10-CM

## 2019-05-30 MED ORDER — DIVALPROEX SODIUM ER 250 MG PO TB24: 750.0000 mg | ORAL_TABLET | Freq: Every day | ORAL | 0 refills | Status: DC

## 2019-05-30 MED ORDER — CLONAZEPAM 1 MG PO TABS: ORAL_TABLET | ORAL | 0 refills | Status: DC
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
## 2019-05-31 ENCOUNTER — Telehealth: Payer: Self-pay | Admitting: Psychiatry

## 2019-05-31 NOTE — Telephone Encounter (Signed)
[name] [name] called to report back to you that the extra Depakote did NOT work well.  It made her very drunk like.  She was running into walls, etc.  Felt it was dangerous how she felt.  She took her 1st Klonopin this morning so will see how that goes.

## 2019-06-02 ENCOUNTER — Telehealth: Payer: Self-pay | Admitting: Psychiatry

## 2019-06-02 NOTE — Telephone Encounter (Signed)
Attempted to call both pt and her husband's phone #'s again. No answer.   Husband called back. He reports that she was excessively drowsy this morning and it seemed to clear around 10:30 am.   He reports that she was able to sleep last night from 7:30- 8 pm, awakened briefly around 11:30 pm until this morning. Yesterday she slept for a few hours after taking Klonopin. He reports that she was falling asleep eating her dinner last. He reports that she had some confusion this morning.   Take 1.5 tabs po QHS tonight. Take Klonopin 0.5 mg 1/2 tab po BID.

## 2019-06-02 NOTE — Telephone Encounter (Signed)
Attempted to return husband's call and L/M. Also attempted to call pt's cell number and L/M.

## 2019-06-02 NOTE — Telephone Encounter (Signed)
Patient's husband called and said that the new medicine you gave her for sleep. She slept well last night but today she can't talk very drowsy, can't keep her eyes open and not able to walk she feels like her mind is not connected. She didn't take the medicine this morning. Please call her husband Barbarann Ehlers at [phone] with what to do next.

## 2019-06-03 ENCOUNTER — Telehealth: Payer: Self-pay | Admitting: Psychiatry

## 2019-06-03 NOTE — Telephone Encounter (Signed)
She reports that Klonopin is causing excessive somnolence with 1/2 tab during the day. She reports that Klonopin 1.5 mg po QHS did not keep her to sleep. She reports that manic s/s have resolved and she has not been experiencing anxiety. Pt asks if she can take Klonopin 2 mg tonight only and not take any during the day. Discussed that she could take Klonopin 2 mg po QHS. Discussed taking low dose Xanax mid-day if she experiences any s/s of benzodiazepine withdrawal.    Patient advised to contact office with any questions, adverse effects, or acute worsening in signs and symptoms.

## 2019-06-05 ENCOUNTER — Telehealth: Payer: Self-pay | Admitting: Psychiatry

## 2019-06-05 ENCOUNTER — Other Ambulatory Visit: Payer: Self-pay

## 2019-06-05 DIAGNOSIS — F319 Bipolar disorder, unspecified: Secondary | ICD-10-CM

## 2019-06-05 DIAGNOSIS — F411 Generalized anxiety disorder: Secondary | ICD-10-CM

## 2019-06-05 DIAGNOSIS — F5101 Primary insomnia: Secondary | ICD-10-CM

## 2019-06-05 MED ORDER — LITHIUM CARBONATE ER 450 MG PO TBCR: 450.0000 mg | EXTENDED_RELEASE_TABLET | Freq: Every day | ORAL | 0 refills | Status: DC

## 2019-06-05 MED ORDER — OLANZAPINE 2.5 MG PO TABS: 2.5000 mg | ORAL_TABLET | Freq: Every day | ORAL | 0 refills | Status: DC

## 2019-06-05 MED ORDER — ALPRAZOLAM 0.5 MG PO TABS: 0.5000 mg | ORAL_TABLET | Freq: Four times a day (QID) | ORAL | 2 refills | Status: DC | PRN

## 2019-06-05 NOTE — Telephone Encounter (Signed)
Received call from pt's husband who reports that pt has had side effects with Klonopin to include excessive somnolence, confusion, and hallucinations. He reports that she slept well Friday and Saturday nights and that she slept most of Sunday morning and napped some on Sunday afternoon and then had difficulty sleeping Sunday night. Continues to have s/s of mania and awakened last night stating that she needs to be doing something and was unable to be still. Discussed stopping Klonopin and resuming Xanax.   Case staffed with Dr. Clovis Pu. Will start Olanzapine 2.5 mg po q evening. Attempted to contact both pt and her husband. Left detailed message re: Olanzapine and potential benefits, risks, and side effects.

## 2019-06-06 ENCOUNTER — Telehealth: Payer: Self-pay

## 2019-06-06 NOTE — Telephone Encounter (Signed)
Prior authorization submitted and approved for OLANZAPINE 2.5 MG #30 through [facility] Medicare Part D, effective 06/06/2019-06/05/2020. [id number]  Submitted through cover my meds

## 2019-06-08 ENCOUNTER — Other Ambulatory Visit: Payer: Self-pay

## 2019-06-08 DIAGNOSIS — F319 Bipolar disorder, unspecified: Secondary | ICD-10-CM

## 2019-06-08 MED ORDER — DIVALPROEX SODIUM ER 250 MG PO TB24: 750.0000 mg | ORAL_TABLET | Freq: Every day | ORAL | 0 refills | Status: DC

## 2019-06-15 ENCOUNTER — Telehealth: Payer: Self-pay | Admitting: Psychiatry

## 2019-06-15 NOTE — Telephone Encounter (Signed)
Kaylina called to report that the new medicine - Olanzapine - is causing weight gain.  Please call to discuss.  Appt 06/28/19

## 2019-06-16 DIAGNOSIS — Z96652 Presence of left artificial knee joint: Secondary | ICD-10-CM | POA: Diagnosis not present

## 2019-06-16 DIAGNOSIS — M545 Low back pain: Secondary | ICD-10-CM | POA: Diagnosis not present

## 2019-06-16 DIAGNOSIS — M5136 Other intervertebral disc degeneration, lumbar region: Secondary | ICD-10-CM | POA: Diagnosis not present

## 2019-06-16 DIAGNOSIS — M4125 Other idiopathic scoliosis, thoracolumbar region: Secondary | ICD-10-CM | POA: Diagnosis not present

## 2019-06-16 NOTE — Addendum Note (Signed)
Addended by: Sharyl Nimrod on: 06/16/2019 05:47 PM   Modules accepted: Orders

## 2019-06-16 NOTE — Telephone Encounter (Signed)
Attempted to call pt. L/M that provider would try calling again this afternoon

## 2019-06-16 NOTE — Telephone Encounter (Signed)
Returned call to pt. She reports that Olanzapine had been helping with sleeplessness until the last couple of nights. She reports that she has been experiencing wt gain and increased appetite. Has had cravings for sweets. She reports that RLS has been worse in the late afternoons, around 4 pm. Reports RLS may be disrupting sleep. She reports that she has been having less impulsivity and racing thoughts. Has woken up to bake the last 2 nights when she was not able to sleep.   PLAN: Stop Olanzapine and try Klonopin 1.5 mg for insomnia this weekend. Can take Xanax prn during the day since Klonopin caused significant somnolence during the day previously.   Requested pt call office early next week with update re: how she is feeling and responding to medication.

## 2019-06-26 ENCOUNTER — Other Ambulatory Visit: Payer: Self-pay

## 2019-06-26 MED ORDER — DONEPEZIL HCL 10 MG PO TABS: 10.0000 mg | ORAL_TABLET | Freq: Every day | ORAL | 0 refills | Status: DC

## 2019-06-28 ENCOUNTER — Ambulatory Visit (INDEPENDENT_AMBULATORY_CARE_PROVIDER_SITE_OTHER): Payer: Medicare Other | Admitting: Psychiatry

## 2019-06-28 ENCOUNTER — Encounter: Payer: Self-pay | Admitting: Psychiatry

## 2019-06-28 DIAGNOSIS — F5101 Primary insomnia: Secondary | ICD-10-CM

## 2019-06-28 DIAGNOSIS — F411 Generalized anxiety disorder: Secondary | ICD-10-CM

## 2019-06-28 DIAGNOSIS — F1011 Alcohol abuse, in remission: Secondary | ICD-10-CM | POA: Diagnosis not present

## 2019-06-28 DIAGNOSIS — F319 Bipolar disorder, unspecified: Secondary | ICD-10-CM | POA: Diagnosis not present
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
## 2019-06-30 ENCOUNTER — Other Ambulatory Visit: Payer: Self-pay

## 2019-06-30 DIAGNOSIS — F5101 Primary insomnia: Secondary | ICD-10-CM

## 2019-06-30 DIAGNOSIS — F319 Bipolar disorder, unspecified: Secondary | ICD-10-CM

## 2019-06-30 MED ORDER — QUETIAPINE FUMARATE 200 MG PO TABS: ORAL_TABLET | ORAL | 0 refills | Status: DC

## 2019-07-06 ENCOUNTER — Telehealth: Payer: Self-pay | Admitting: Psychiatry

## 2019-07-06 DIAGNOSIS — F319 Bipolar disorder, unspecified: Secondary | ICD-10-CM

## 2019-07-06 MED ORDER — CAPLYTA 42 MG PO CAPS: 42.0000 mg | ORAL_CAPSULE | Freq: Every day | ORAL | 0 refills | Status: DC

## 2019-07-06 NOTE — Telephone Encounter (Addendum)
She reports that her sleep has been "off and on" with Klonopin. She reports that Klonopin seems to bring her down and cause her to feel "spacey and weird."  She reports that manic s/s have improved some when she is taking Klonopin.   She reports that she woke up last night at 11:30pm and was up the remainder of the night.   Discussed starting Caplyta and potential benefits, risks, and side effects. Pt agrees to starting [place].  Decrease Seroquel to 200 mg QHS while taking Caplyta.   Patient advised to contact office with any questions, adverse effects, or acute worsening in signs and symptoms.

## 2019-07-07 DIAGNOSIS — M545 Low back pain: Secondary | ICD-10-CM | POA: Diagnosis not present

## 2019-07-07 DIAGNOSIS — M25562 Pain in left knee: Secondary | ICD-10-CM | POA: Diagnosis not present

## 2019-07-09 ENCOUNTER — Emergency Department (HOSPITAL_BASED_OUTPATIENT_CLINIC_OR_DEPARTMENT_OTHER)
Admit: 2019-07-09 | Discharge: 2019-07-09 | Disposition: A | Discharge status: Home / Self Care | Payer: Medicare Other | Attending: Emergency Medicine | Admitting: Emergency Medicine

## 2019-07-09 ENCOUNTER — Emergency Department (HOSPITAL_COMMUNITY)
Admission: EM | Admit: 2019-07-09 | Discharge: 2019-07-09 | Disposition: A | Discharge status: Home / Self Care | Payer: Medicare Other | Attending: Emergency Medicine | Admitting: Emergency Medicine

## 2019-07-09 ENCOUNTER — Other Ambulatory Visit: Payer: Self-pay

## 2019-07-09 ENCOUNTER — Encounter (HOSPITAL_COMMUNITY): Payer: Self-pay

## 2019-07-09 DIAGNOSIS — E039 Hypothyroidism, unspecified: Secondary | ICD-10-CM | POA: Diagnosis not present

## 2019-07-09 DIAGNOSIS — Z96652 Presence of left artificial knee joint: Secondary | ICD-10-CM | POA: Diagnosis not present

## 2019-07-09 DIAGNOSIS — Z96641 Presence of right artificial hip joint: Secondary | ICD-10-CM | POA: Diagnosis not present

## 2019-07-09 DIAGNOSIS — Z79899 Other long term (current) drug therapy: Secondary | ICD-10-CM | POA: Diagnosis not present

## 2019-07-09 DIAGNOSIS — Z794 Long term (current) use of insulin: Secondary | ICD-10-CM | POA: Diagnosis not present

## 2019-07-09 DIAGNOSIS — I10 Essential (primary) hypertension: Secondary | ICD-10-CM | POA: Insufficient documentation

## 2019-07-09 DIAGNOSIS — R600 Localized edema: Secondary | ICD-10-CM | POA: Diagnosis not present

## 2019-07-09 DIAGNOSIS — M7989 Other specified soft tissue disorders: Secondary | ICD-10-CM

## 2019-07-09 DIAGNOSIS — M79605 Pain in left leg: Secondary | ICD-10-CM | POA: Insufficient documentation

## 2019-07-09 DIAGNOSIS — E119 Type 2 diabetes mellitus without complications: Secondary | ICD-10-CM | POA: Diagnosis not present

## 2019-07-09 DIAGNOSIS — R609 Edema, unspecified: Secondary | ICD-10-CM | POA: Insufficient documentation

## 2019-07-09 DIAGNOSIS — R2242 Localized swelling, mass and lump, left lower limb: Secondary | ICD-10-CM | POA: Diagnosis present

## 2019-07-09 MED ORDER — SENNOSIDES-DOCUSATE SODIUM 8.6-50 MG PO TABS: 1.0000 | ORAL_TABLET | Freq: Every day | ORAL | 0 refills | Status: AC

## 2019-07-09 MED ORDER — OXYCODONE HCL 5 MG PO TABS: 5.0000 mg | ORAL_TABLET | Freq: Four times a day (QID) | ORAL | 0 refills | Status: AC | PRN

## 2019-07-09 MED ORDER — POTASSIUM CHLORIDE ER 10 MEQ PO CPCR: 10.0000 meq | ORAL_CAPSULE | Freq: Every day | ORAL | 0 refills | Status: DC

## 2019-07-09 MED ORDER — OXYCODONE-ACETAMINOPHEN 5-325 MG PO TABS
1.0000 | ORAL_TABLET | Freq: Once | ORAL | Status: AC
  Administered 2019-07-09: 1 via ORAL
  Filled 2019-07-09: qty 1

## 2019-07-09 MED ORDER — FUROSEMIDE 20 MG PO TABS: 20.0000 mg | ORAL_TABLET | Freq: Every day | ORAL | 0 refills | Status: DC

## 2019-07-09 NOTE — Progress Notes (Signed)
Left lower extremity venous duplex has been completed. Preliminary results can be found in CV Proc through chart review.  Results were given to Dr. Langston Masker.  07/09/19 9:01 AM [name] [name] RVT

## 2019-07-09 NOTE — Discharge Instructions (Addendum)
Please follow up with your orthopedic surgeon this week in the office.  Your ultrasound today did not show signs of blood clots, or deep venous thrombosis ("DVT").  However, as I explained, a small number of clots may be missed on initial ultrasound.  If you continue having leg swelling in 1--2 weeks, your doctor or surgeon should repeat an ultrasound.  At home keep your left leg elevated whenever possible.  I also prescribed lasix 20 mg to take once daily, for the next 10 days, to help you urinate some extra fluid out.  Take potassium daily along with the lasix.  Remember to keep drinking water to the level of your thirst.  Your urine should look clear or light yellow.  I prescribed some oxycodone tablets for the next 3 days.  We do not prescribe long-term opioids from the ER.  I also prescribe stool softeners to prevent constipation while taking these.

## 2019-07-09 NOTE — ED Triage Notes (Signed)
Pt reports L knee and ankle swelling. She had a L knee replacement about 5 months ago. States that moving and walking makes it worse. A&Ox4. Ambulatory. She denies new injury.

## 2019-07-09 NOTE — ED Provider Notes (Signed)
[name] DEPT Provider Note   CSN: [id number] Arrival date & time: 07/09/19  0544     History Chief Complaint  Patient presents with  . Leg Swelling    [name] [name] is a 74 y.o. female with a history of left knee replacement approximately 5 weeks ago presented to emergency department with pain and swelling in her left lower leg.  She reports has been ongoing for about 2 weeks.  She has had intermittent swelling but seems to be more progressive now.  She was last seen by orthopedist about 3 weeks ago.  She does report she has chronic low back pain it feels like her pain is sometimes travels down and up her leg, involving the upper and lower leg.  She has also diabetes and peripheral neuropathy all the time.  She does report that she continues having pain in her left knee, but is undergoing physical therapy and has good range of motion and is able to ambulate at home.  She has no prior history of DVT or PE.  She was on anticoagulation for  about a month after her procedure but has stopped since.  No falls or trauma to her knee  HPI     Past Medical History:  Diagnosis Date  . Alcohol abuse   . Anxiety    takes Valium daily as needed and Ativan daily  . Bilateral hearing loss   . Bipolar 1 disorder ([place])    takes Lithium nightly and Synthroid daily  . Chronic back pain    DDD; "all over" (09/14/2017)  . Colitis, ischemic ([name]) 2012  . Confusion    r/t meds  . Depression    takes Prozac daily and Bupspirone   . Diverticulosis   . Dyslipidemia    takes Crestor daily  . Fibromyalgia   . Headache    "weekly" (09/14/2017)  . Hepatic steatosis 06/18/12   severe  . Hyperlipidemia   . Hypertension   . Hypothyroidism   . IBS (irritable bowel syndrome)   . Ischemic colitis ([place])   . Joint pain   . Joint swelling   . Lupus erythematosus tumidus    tumid-skin  . Migraine    "1-2/yr; maybe" (09/14/2017)  . Numbness    in right foot  .  Osteoarthritis    "all over" (09/14/2017)  . Osteoarthritis cervical spine   . Osteoarthritis of hand    bilateral  . Pneumonia    "walking pneumonia several times; long time since the last time" (09/14/2017)  . Restless leg syndrome    takes Requip nightly  . Sciatica   . Type II diabetes mellitus ([place])   . Urinary frequency   . Urinary leakage   . Urinary urgency   . Urinary, incontinence, stress female   . Walking pneumonia    last time more than 16yrs ago    Patient Active Problem List   Diagnosis Date Noted  . Overweight (BMI 25.0-29.9) 02/08/2019  . S/P left TKA 02/07/2019  . Chest pain   . Chest pain with normal coronary angiography   . Diverticulitis 11/19/2016  . Hypothyroidism 07/14/2016  . Diarrhea   . Generalized abdominal pain   . Major depressive disorder, recurrent severe without psychotic features ([place]) 12/31/2015  . Nausea without vomiting   . Colitis 10/28/2015  . Unintentional poisoning by psychotropic drug 07/05/2015  . Restless leg syndrome 07/05/2015  . Diabetic polyneuropathy associated with type 2 diabetes mellitus ([place]) 09/21/2014  . Arthritis of  left hip 06/09/2013  . Arthritis of right hip 06/08/2013  . Hip pain, right 05/02/2013  . IBS (irritable bowel syndrome) 01/02/2013  . Unspecified vitamin D deficiency 07/05/2012  . Pure hyperglyceridemia 07/04/2012  . Diabetes mellitus with diabetic neuropathy, without long-term current use of insulin ([place]) 06/18/2012  . Hypertension 06/18/2012  . Bipolar 1 disorder ([place]) 06/18/2012  . Low back pain 02/25/2011  . Acute ischemic colitis ([place]) 01/06/2011  . Sciatica 12/19/2010  . Fibromyalgia 10/28/2010  . Primary osteoarthritis of right hip 10/24/2010  . Hyperlipidemia with target low density lipoprotein (LDL) cholesterol less than 100 mg/dL 05/05/2010  . DEPRESSION/ANXIETY 05/05/2010  . ABUSE, ALCOHOL, IN REMISSION 05/05/2010  . OTITIS MEDIA, CHRONIC 05/05/2010  . Hearing loss 05/05/2010  . ALLERGIC  RHINITIS, SEASONAL, MILD 05/05/2010  . URINARY INCONTINENCE, STRESS, FEMALE 05/05/2010  . OSTEOARTHRITIS, HANDS, BILATERAL 05/05/2010  . OSTEOARTHRITIS, CERVICAL SPINE 05/05/2010    Past Surgical History:  Procedure Laterality Date  . ABDOMINAL HYSTERECTOMY     "they left my ovaries"  . APPENDECTOMY    . CARDIAC CATHETERIZATION  09/14/2017  . COLONOSCOPY    . DENTAL SURGERY Left 10/2016   dental implant  . DILATION AND CURETTAGE OF UTERUS  X 4  . ESOPHAGOGASTRODUODENOSCOPY    . FLEXIBLE SIGMOIDOSCOPY N/A 06/21/2012   Procedure: FLEXIBLE SIGMOIDOSCOPY;  Surgeon: Jerene Bears, MD;  Location: [facility];  Service: Gastroenterology;  Laterality: N/A;  . JOINT REPLACEMENT    . LEFT HEART CATH AND CORONARY ANGIOGRAPHY N/A 09/14/2017   Procedure: LEFT HEART CATH AND CORONARY ANGIOGRAPHY;  Surgeon: Belva Crome, MD;  Location: [facility];  Service: Cardiovascular;  Laterality: N/A;  . SHOULDER ARTHROSCOPY Right    "shaved spurs off rotator cuff"  . TONSILLECTOMY    . TOTAL HIP ARTHROPLASTY Right 06/09/2013   Procedure: TOTAL HIP ARTHROPLASTY;  Surgeon: Kerin Salen, MD;  Location: [facility];  Service: Orthopedics;  Laterality: Right;  . TOTAL KNEE ARTHROPLASTY Left 02/07/2019   Procedure: TOTAL KNEE ARTHROPLASTY;  Surgeon: Paralee Cancel, MD;  Location: [facility];  Service: Orthopedics;  Laterality: Left;  70 mins  . TUBAL LIGATION    . TUMOR EXCISION Right 1968   angle of jaw; benign     OB History   No obstetric history on file.     Family History  Problem Relation Age of Onset  . Drug abuse Mother   . Alcohol abuse Mother   . Alcohol abuse Father   . Hypertension Father   . CAD Brother   . Hypertension Brother   . Alcohol abuse Brother   . Hypertension Brother   . Hypertension Brother   . Psoriasis Daughter   . Arthritis Daughter        psoriatic arthritis   . Alcohol abuse Grandchild     Social History   Tobacco Use  . Smoking status: Never Smoker  . Smokeless  tobacco: Never Used  Substance Use Topics  . Alcohol use: Not Currently    Comment: 09/14/2017 "nothing since 04/15/2008"  . Drug use: Not Currently    Frequency: 7.0 times per week    Types: Benzodiazepines    Home Medications Prior to Admission medications   Medication Sig Start Date End Date Taking? Authorizing Provider  acetaminophen (TYLENOL) 500 MG tablet Take 1,000 mg by mouth 3 (three) times daily.    [provider]  ALPRAZolam Duanne Moron) 0.5 MG tablet Take 1 tablet (0.5 mg total) by mouth 4 (four) times daily  as needed for anxiety. 06/05/19   [name], PMHNP  amoxicillin (AMOXIL) 500 MG capsule amoxicillin 500 mg capsule  take 4 capsules by mouth 1 HOUR PRIOR TO DENTAL APPOINTMENT    [provider]  atorvastatin (LIPITOR) 40 MG tablet Take 40 mg by mouth at bedtime.     [provider]  BD PEN NEEDLE NANO U/F 32G X 4 MM MISC USE TO INJECT 5 TIMES DAILY AS DIRECTED 05/05/19   Elayne Snare, MD  celecoxib (CELEBREX) 200 MG capsule Take 200 mg by mouth at bedtime.    [provider]  Cholecalciferol (VITAMIN D) 50 MCG (2000 UT) tablet Take 2,000 Units by mouth daily.    [provider]  dicyclomine (BENTYL) 10 MG capsule Take 2 capsules (20 mg total) by mouth every 8 (eight) hours as needed for spasms. 04/17/19   Noralyn Pick, NP  diphenoxylate-atropine (LOMOTIL) 2.5-0.025 MG tablet Take 1 tablet by mouth 2 (two) times daily as needed for diarrhea or loose stools. 05/31/18   Doran Stabler, MD  divalproex (DEPAKOTE ER) 250 MG 24 hr tablet Take 3 tablets (750 mg total) by mouth daily. 06/08/19   [name], PMHNP  docusate sodium (COLACE) 100 MG capsule Take 1 capsule (100 mg total) by mouth 2 (two) times daily. 02/08/19   Danae Orleans, PA-C  donepezil (ARICEPT) 10 MG tablet Take 1 tablet (10 mg total) by mouth at bedtime. 06/26/19   [name], PMHNP  esomeprazole (NEXIUM) 40 MG capsule Take 40 mg by mouth daily. 05/26/19    [provider]  ferrous sulfate (FERROUSUL) 325 (65 FE) MG tablet Take 1 tablet (325 mg total) by mouth 3 (three) times daily with meals for 14 days. 02/08/19 02/22/19  Danae Orleans, PA-C  fluticasone (FLONASE) 50 MCG/ACT nasal spray Place 1 spray into both nostrils daily as needed for allergies or rhinitis.    [provider]  furosemide (LASIX) 20 MG tablet furosemide 20 mg tablet  TAKE 1 TABLET BY MOUTH EVERY DAY    [provider]  furosemide (LASIX) 20 MG tablet Take 1 tablet (20 mg total) by mouth daily for 10 days. 07/09/19 07/19/19  Wyvonnia Dusky, MD  gabapentin (NEURONTIN) 600 MG tablet Take 600 mg by mouth 3 (three) times daily. 05/12/19   [provider]  glucose blood (CONTOUR NEXT TEST) test strip Use to test blood sugar 2 times daily 04/28/18   Elayne Snare, MD  HUMALOG KWIKPEN 100 UNIT/ML KwikPen INJECT 10 TO 14 UNITS THREE TIMES DAILY FOR BLOOD SUGARS OVER 180 05/22/19   Elayne Snare, MD  hyoscyamine (LEVBID) 0.375 MG 12 hr tablet hyoscyamine ER 0.375 mg tablet,extended release,12 hr  take 1 tablet by mouth once daily BEFORE BREAKFAST    [provider]  Insulin Glargine (LANTUS SOLOSTAR) 100 UNIT/ML Solostar Pen Inject 54 units under the skin once daily. 05/05/19   Elayne Snare, MD  levothyroxine (SYNTHROID) 75 MCG tablet TAKE 1 TABLET BY MOUTH DAILY BEFORE BREAKFAST 05/01/19   Elayne Snare, MD  lithium carbonate (ESKALITH) 450 MG CR tablet Take 1 tablet (450 mg total) by mouth at bedtime. 06/05/19   [name], PMHNP  loperamide (IMODIUM A-D) 2 MG tablet Take 2 mg by mouth 2 (two) times daily as needed for diarrhea or loose stools.    [provider]  losartan (COZAAR) 50 MG tablet Take 25 mg by mouth daily.     [provider]  Lumateperone Tosylate (CAPLYTA) 42 MG CAPS  Take 42 mg by mouth daily. 07/06/19   [name], PMHNP  Melatonin 10 MG TBCR Take 10 mg by mouth at bedtime.    [provider]    memantine (NAMENDA) 10 MG tablet TAKE 1 TABLET(10 MG) BY MOUTH EVERY EVENING 04/25/19   [name], PMHNP  Mesalamine 800 MG TBEC Take 2 tablets (1,600 mg total) by mouth 3 (three) times daily for 3 days. 06/30/18 04/04/20  Doran Stabler, MD  methocarbamol (ROBAXIN) 500 MG tablet Take 1 tablet (500 mg total) by mouth every 6 (six) hours as needed for muscle spasms. 02/08/19   Danae Orleans, PA-C  Microlet Lancets MISC USE DAILY AS DIRECTED 10/24/18   Elayne Snare, MD  nitroGLYCERIN (NITROSTAT) 0.4 MG SL tablet Place 1 tablet (0.4 mg total) under the tongue every 5 (five) minutes as needed for chest pain. 09/14/17   Lyda Jester M, PA-C  ondansetron (ZOFRAN ODT) 4 MG disintegrating tablet 4mg  ODT q4 hours prn nausea/vomit Patient taking differently: Take 4 mg by mouth every 6 (six) hours as needed for nausea or vomiting.  05/19/18   Milton Ferguson, MD  ondansetron (ZOFRAN) 4 MG tablet Take 1 tablet (4 mg total) by mouth every 6 (six) hours as needed for nausea or vomiting. 05/10/19   Doran Stabler, MD  oxyCODONE (OXY IR/ROXICODONE) 5 MG immediate release tablet Take 1-3 tablets (5-15 mg total) by mouth every 4 (four) hours as needed for moderate pain or severe pain. 02/08/19   Danae Orleans, PA-C  oxyCODONE (ROXICODONE) 5 MG immediate release tablet Take 1 tablet (5 mg total) by mouth every 6 (six) hours as needed for up to 3 days for severe pain. 07/09/19 07/12/19  Wyvonnia Dusky, MD  OZEMPIC, 0.25 OR 0.5 MG/DOSE, 2 MG/1.5ML SOPN INJECT 0.5 MG INTO THE SKIN ONCE WEEKLY 02/07/19   Elayne Snare, MD  polyethylene glycol (MIRALAX / GLYCOLAX) 17 g packet Take 17 g by mouth 2 (two) times daily. 02/08/19   Danae Orleans, PA-C  potassium chloride (MICRO-K) 10 MEQ CR capsule Take 1 capsule (10 mEq total) by mouth daily for 10 days. 07/09/19 07/19/19  Wyvonnia Dusky, MD  QUEtiapine (SEROQUEL) 200 MG tablet TAKE 1 TAB PO q 5 pm and 1 tab po QHS 06/30/19   [name], PMHNP  rivaroxaban (XARELTO)  10 MG TABS tablet Take 1 tablet (10 mg total) by mouth daily. 02/08/19   Danae Orleans, PA-C  rOPINIRole (REQUIP) 0.5 MG tablet Take 3 tablets (1.5 mg total) by mouth 3 (three) times daily. 03/31/19   [name], PMHNP  senna-docusate (SENOKOT-S) 8.6-50 MG tablet Take 1 tablet by mouth daily for 30 doses. 07/09/19 08/08/19  Wyvonnia Dusky, MD  terconazole (TERAZOL 7) 0.4 % vaginal cream terconazole 0.4 % vaginal cream  APPLY TO THE AFFECTED AREA ONCE DAILY FOR 7 DAYS    [provider]  triamcinolone cream (KENALOG) 0.1 % triamcinolone acetonide 0.1 % topical cream  APPLY TO LEFT LEG TWICE DAILY    [provider]    Allergies    Codeine, Macrolides and ketolides, Procaine hcl, Aspirin, Benadryl [diphenhydramine], Diflucan [fluconazole], Dilaudid [hydromorphone hcl], and Sulfa antibiotics  Review of Systems   Review of Systems  Constitutional: Negative for chills and fever.  Eyes: Negative for pain and visual disturbance.  Respiratory: Negative for cough and shortness of breath.   Cardiovascular: Negative for chest pain and palpitations.  Gastrointestinal: Negative for abdominal pain and vomiting.  Genitourinary: Negative for dysuria and  hematuria.  Musculoskeletal: Positive for arthralgias, back pain and myalgias.  Skin: Negative for color change and rash.  Neurological: Positive for numbness. Negative for syncope.  All other systems reviewed and are negative.   Physical Exam Updated Vital Signs BP 139/73 (BP Location: Right Arm)   Pulse 86   Temp 98.2 F (36.8 C) (Oral)   Resp 16   Ht 5\' 2"  (1.575 m)   Wt 79.4 kg   SpO2 96%   BMI 32.01 kg/m   Physical Exam Vitals and nursing note reviewed.  Constitutional:      General: She is not in acute distress.    Appearance: She is well-developed.  HENT:     Head: Normocephalic and atraumatic.  Eyes:     Conjunctiva/sclera: Conjunctivae normal.  Cardiovascular:     Rate and Rhythm: Normal rate and regular  rhythm.     Pulses: Normal pulses.     Comments: Equal palpable pedal pulses Pulmonary:     Effort: Pulmonary effort is normal. No respiratory distress.  Abdominal:     Palpations: Abdomen is soft.     Tenderness: There is no abdominal tenderness.  Musculoskeletal:     Cervical back: Neck supple.     Comments: Excellent ROM at the left knee and bilateral hips Minimal left knee joint effusion without warmth or erythema  Skin:    General: Skin is warm and dry.     Comments: Surgical scar left knee appears well healed  Neurological:     General: No focal deficit present.     Mental Status: She is alert and oriented to person, place, and time.     Sensory: No sensory deficit.     Motor: No weakness.     Comments: Negative straight leg test  Psychiatric:        Mood and Affect: Mood normal.        Behavior: Behavior normal.     ED Results / Procedures / Treatments   Labs (all labs ordered are listed, but only abnormal results are displayed) Labs Reviewed - No data to display  EKG None  Radiology VAS [place] LOWER EXTREMITY VENOUS (DVT) (ONLY [facility] & [facility])  Result Date: 07/09/2019  Lower Venous DVTStudy Indications: Swelling.  Risk Factors: Surgery. Limitations: Body habitus and poor ultrasound/tissue interface. Comparison Study: No prior studies. Performing Technologist: Oliver Hum RVT  Examination Guidelines: A complete evaluation includes B-mode imaging, spectral Doppler, color Doppler, and power Doppler as needed of all accessible portions of each vessel. Bilateral testing is considered an integral part of a complete examination. Limited examinations for reoccurring indications may be performed as noted. The reflux portion of the exam is performed with the patient in reverse Trendelenburg.  +-----+---------------+---------+-----------+----------+--------------+ RIGHTCompressibilityPhasicitySpontaneityPropertiesThrombus Aging  +-----+---------------+---------+-----------+----------+--------------+ CFV  Full           Yes      Yes                                 +-----+---------------+---------+-----------+----------+--------------+   +---------+---------------+---------+-----------+----------+--------------+ LEFT     CompressibilityPhasicitySpontaneityPropertiesThrombus Aging +---------+---------------+---------+-----------+----------+--------------+ CFV      Full           Yes      Yes                                 +---------+---------------+---------+-----------+----------+--------------+ SFJ      Full                                                        +---------+---------------+---------+-----------+----------+--------------+  FV Prox  Full                                                        +---------+---------------+---------+-----------+----------+--------------+ FV Mid   Full                                                        +---------+---------------+---------+-----------+----------+--------------+ FV DistalFull                                                        +---------+---------------+---------+-----------+----------+--------------+ PFV      Full                                                        +---------+---------------+---------+-----------+----------+--------------+ POP      Full           Yes      Yes                                 +---------+---------------+---------+-----------+----------+--------------+ PTV      Full                                                        +---------+---------------+---------+-----------+----------+--------------+ PERO                                                  Not visualized +---------+---------------+---------+-----------+----------+--------------+     Summary: RIGHT: - No evidence of common femoral vein obstruction.  LEFT: - There is no evidence of deep vein thrombosis in the  lower extremity. However, portions of this examination were limited- see technologist comments above.  - No cystic structure found in the popliteal fossa.  *See table(s) above for measurements and observations. Electronically signed by Monica Martinez MD on 07/09/2019 at 11:05:55 AM.    Final     Procedures Procedures (including critical care time)  Medications Ordered in ED Medications  oxyCODONE-acetaminophen (PERCOCET/ROXICET) 5-325 MG per tablet 1 tablet (1 tablet Oral Given 07/09/19 0941)    ED Course  I have reviewed the triage vital signs and the nursing notes.  Pertinent labs & imaging results that were available during my care of the patient were reviewed by me and considered in my medical decision making (see chart for details).  74 yo female presented to emergency department left leg pain and swelling after left knee replacement 5 weeks ago.  She is neurovascularly intact.  She has no signs or symptoms of  joint infection and has an excellent range of motion on exam.  I am doubtful of a fracture.  There is no traumatic mechanism.  She has some very mild overall swelling of the left leg but in my eyes does not significantly swollen.  She has been on Lasix intermittently in the past for symptomatic relief for this but has not taken it in several days.  I think it is reasonable for her to resume Lasix for 1 to 2 weeks, as well as potassium supplements, for her swelling.  We talked about continued elevation of the leg at home.  She spends a lot of time on her feet.  In terms of her pain, this may be related to sciatica as she has chronic low back pain is complaining of pain down her whole left leg.  She has no objective weakness on my exam.  She is not a good candidate for steroids because she has bipolar disorder and also she is diabetic on insulin.  She did steroids make her manic.  Unfortunately she is also not a candidate for NSAIDs because she says she takes lithium.  I can prescribe a  few days of percocet (she has had oxycodone in the past and tolerates it WELL) until she can f/u with her orthopedist.  DVT study was NEGATIVE today, although some limited views. I advised that her doctor repeat the study in 1-2 weeks if she continues having persistent swelling.  No signs or symptoms or PE at this time.  Clinical Course as of Jul 09 1643  Sun Jul 09, 2019  0903 Ultrasound technician reports no visible DVT or cysts on scan, per my review I agree   [MT]    Clinical Course User Index [MT] Wyvonnia Dusky, MD   Final Clinical Impression(s) / ED Diagnoses Final diagnoses:  Leg edema  Pain in left leg    Rx / DC Orders ED Discharge Orders         Ordered    furosemide (LASIX) 20 MG tablet  Daily     07/09/19 0937    potassium chloride (MICRO-K) 10 MEQ CR capsule  Daily     07/09/19 0937    senna-docusate (SENOKOT-S) 8.6-50 MG tablet  Daily     07/09/19 0937    oxyCODONE (ROXICODONE) 5 MG immediate release tablet  Every 6 hours PRN     07/09/19 0937           Wyvonnia Dusky, MD 07/09/19 1645

## 2019-07-13 ENCOUNTER — Encounter: Payer: Self-pay | Admitting: Psychiatry

## 2019-07-13 ENCOUNTER — Ambulatory Visit (INDEPENDENT_AMBULATORY_CARE_PROVIDER_SITE_OTHER): Payer: Medicare Other | Admitting: Psychiatry

## 2019-07-13 ENCOUNTER — Other Ambulatory Visit: Payer: Self-pay

## 2019-07-13 DIAGNOSIS — F319 Bipolar disorder, unspecified: Secondary | ICD-10-CM | POA: Diagnosis not present

## 2019-07-13 DIAGNOSIS — F411 Generalized anxiety disorder: Secondary | ICD-10-CM

## 2019-07-13 DIAGNOSIS — F630 Pathological gambling: Secondary | ICD-10-CM | POA: Diagnosis not present

## 2019-07-13 DIAGNOSIS — F5101 Primary insomnia: Secondary | ICD-10-CM

## 2019-07-13 MED ORDER — LOXAPINE SUCCINATE 5 MG PO CAPS: ORAL_CAPSULE | ORAL | 0 refills | Status: DC
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
## 2019-07-21 ENCOUNTER — Telehealth: Payer: Self-pay

## 2019-07-21 NOTE — Telephone Encounter (Signed)
Prior authorization submitted and approved for loxapine 5 mg capsules #60 effective 07/21/2019-07/21/2018 with [facility]  ID# [id number]

## 2019-07-24 ENCOUNTER — Other Ambulatory Visit: Payer: Self-pay

## 2019-07-24 DIAGNOSIS — G2581 Restless legs syndrome: Secondary | ICD-10-CM

## 2019-07-24 DIAGNOSIS — H9312 Tinnitus, left ear: Secondary | ICD-10-CM | POA: Diagnosis not present

## 2019-07-24 MED ORDER — ROPINIROLE HCL 0.5 MG PO TABS: ORAL_TABLET | ORAL | 0 refills | Status: DC

## 2019-07-25 ENCOUNTER — Other Ambulatory Visit: Payer: Self-pay

## 2019-07-25 DIAGNOSIS — G2581 Restless legs syndrome: Secondary | ICD-10-CM

## 2019-07-25 MED ORDER — ROPINIROLE HCL 0.5 MG PO TABS: ORAL_TABLET | ORAL | 0 refills | Status: DC

## 2019-07-31 ENCOUNTER — Other Ambulatory Visit: Payer: Self-pay | Admitting: Endocrinology

## 2019-08-03 ENCOUNTER — Encounter: Payer: Self-pay | Admitting: Psychiatry

## 2019-08-03 ENCOUNTER — Other Ambulatory Visit: Payer: Self-pay

## 2019-08-03 ENCOUNTER — Ambulatory Visit (INDEPENDENT_AMBULATORY_CARE_PROVIDER_SITE_OTHER): Payer: Medicare Other | Admitting: Psychiatry

## 2019-08-03 DIAGNOSIS — F411 Generalized anxiety disorder: Secondary | ICD-10-CM

## 2019-08-03 DIAGNOSIS — F319 Bipolar disorder, unspecified: Secondary | ICD-10-CM | POA: Diagnosis not present

## 2019-08-03 DIAGNOSIS — F5101 Primary insomnia: Secondary | ICD-10-CM

## 2019-08-03 MED ORDER — MEMANTINE HCL 10 MG PO TABS: ORAL_TABLET | ORAL | 0 refills | Status: DC

## 2019-08-03 MED ORDER — ALPRAZOLAM 0.5 MG PO TABS: 0.5000 mg | ORAL_TABLET | Freq: Four times a day (QID) | ORAL | 2 refills | Status: DC | PRN

## 2019-08-03 MED ORDER — LITHIUM CARBONATE ER 450 MG PO TBCR: 450.0000 mg | EXTENDED_RELEASE_TABLET | Freq: Every day | ORAL | 0 refills | Status: DC
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
## 2019-08-14 DIAGNOSIS — N390 Urinary tract infection, site not specified: Secondary | ICD-10-CM | POA: Diagnosis not present

## 2019-08-14 DIAGNOSIS — R5383 Other fatigue: Secondary | ICD-10-CM | POA: Diagnosis not present

## 2019-08-14 DIAGNOSIS — R350 Frequency of micturition: Secondary | ICD-10-CM | POA: Diagnosis not present

## 2019-08-14 DIAGNOSIS — Z1159 Encounter for screening for other viral diseases: Secondary | ICD-10-CM | POA: Diagnosis not present

## 2019-08-24 ENCOUNTER — Telehealth: Payer: Self-pay | Admitting: Endocrinology

## 2019-08-24 NOTE — Telephone Encounter (Signed)
Patient called asking to talk to [facility] regarding having problems with her feet. Patient can be reached at [phone]

## 2019-08-25 ENCOUNTER — Ambulatory Visit: Payer: Medicare Other | Admitting: Psychiatry

## 2019-08-25 NOTE — Telephone Encounter (Signed)
Returned patient's call and she stated that her feet seem to be cramping and her toes are hurting a lot. Asked for clarification from pt if she knows the difference between cramping and nerve pain or if she was able to discern the difference in sensation of pain. Pt reports that she does not really know the difference in how it feels.  Please advise on how you would like pt to proceed. Last office visit note indicates that pt return in 3 months. When pt is called back, she will be scheduled for this f/u.

## 2019-08-25 NOTE — Telephone Encounter (Signed)
Called pt and gave her MD message. Pt verbalized understanding, and she was also scheduled for f/u visit with prior labs.

## 2019-08-25 NOTE — Telephone Encounter (Signed)
Since she is complaining about cramping would like her to first see her PCP for this problem.  Also needs usual follow-up for diabetes with labs

## 2019-08-28 ENCOUNTER — Other Ambulatory Visit (INDEPENDENT_AMBULATORY_CARE_PROVIDER_SITE_OTHER): Payer: Medicare Other

## 2019-08-28 ENCOUNTER — Other Ambulatory Visit: Payer: Self-pay

## 2019-08-28 DIAGNOSIS — Z794 Long term (current) use of insulin: Secondary | ICD-10-CM

## 2019-08-28 DIAGNOSIS — E782 Mixed hyperlipidemia: Secondary | ICD-10-CM | POA: Diagnosis not present

## 2019-08-28 DIAGNOSIS — E1165 Type 2 diabetes mellitus with hyperglycemia: Secondary | ICD-10-CM | POA: Diagnosis not present

## 2019-08-28 DIAGNOSIS — E039 Hypothyroidism, unspecified: Secondary | ICD-10-CM

## 2019-08-28 LAB — T4, FREE: Free T4: 0.9 ng/dL (ref 0.60–1.60)

## 2019-08-28 LAB — COMPREHENSIVE METABOLIC PANEL
ALT: 10 U/L (ref 0–35)
AST: 12 U/L (ref 0–37)
Albumin: 4.4 g/dL (ref 3.5–5.2)
Alkaline Phosphatase: 87 U/L (ref 39–117)
BUN: 14 mg/dL (ref 6–23)
CO2: 25 mEq/L (ref 19–32)
Calcium: 10.5 mg/dL (ref 8.4–10.5)
Chloride: 107 mEq/L (ref 96–112)
Creatinine, Ser: 0.85 mg/dL (ref 0.40–1.20)
GFR: 65.82 mL/min (ref >60.00)
Glucose, Bld: 99 mg/dL (ref 70–99)
Potassium: 4.8 mEq/L (ref 3.5–5.1)
Sodium: 140 mEq/L (ref 135–145)
Total Bilirubin: 0.4 mg/dL (ref 0.2–1.2)
Total Protein: 6.9 g/dL (ref 6.0–8.3)

## 2019-08-28 LAB — LIPID PANEL
Cholesterol: 206 mg/dL — ABNORMAL HIGH (ref 0–200)
HDL: 53.3 mg/dL (ref >39.00)
NonHDL: 153.02
Total CHOL/HDL Ratio: 4
Triglycerides: 259 mg/dL — ABNORMAL HIGH (ref 0.0–149.0)
VLDL: 51.8 mg/dL — ABNORMAL HIGH (ref 0.0–40.0)

## 2019-08-28 LAB — T3, FREE: T3, Free: 3.2 pg/mL (ref 2.3–4.2)

## 2019-08-28 LAB — MICROALBUMIN / CREATININE URINE RATIO
Creatinine,U: 127.1 mg/dL
Microalb Creat Ratio: 6.5 mg/g (ref 0.0–30.0)
Microalb, Ur: 8.3 mg/dL — ABNORMAL HIGH (ref 0.0–1.9)

## 2019-08-28 LAB — HEMOGLOBIN A1C: Hgb A1c MFr Bld: 6.1 % (ref 4.6–6.5)

## 2019-08-28 LAB — LDL CHOLESTEROL, DIRECT: Direct LDL: 116 mg/dL

## 2019-08-28 LAB — TSH: TSH: 1.37 u[IU]/mL (ref 0.35–4.50)

## 2019-08-30 ENCOUNTER — Other Ambulatory Visit: Payer: Self-pay

## 2019-08-30 ENCOUNTER — Encounter: Payer: Self-pay | Admitting: Endocrinology

## 2019-08-30 ENCOUNTER — Ambulatory Visit (INDEPENDENT_AMBULATORY_CARE_PROVIDER_SITE_OTHER): Payer: Medicare Other | Admitting: Endocrinology

## 2019-08-30 VITALS — BP 108/70 | HR 87 | Ht 62.0 in | Wt 177.8 lb

## 2019-08-30 DIAGNOSIS — E039 Hypothyroidism, unspecified: Secondary | ICD-10-CM | POA: Diagnosis not present

## 2019-08-30 DIAGNOSIS — E782 Mixed hyperlipidemia: Secondary | ICD-10-CM | POA: Diagnosis not present

## 2019-08-30 DIAGNOSIS — E1165 Type 2 diabetes mellitus with hyperglycemia: Secondary | ICD-10-CM

## 2019-08-30 DIAGNOSIS — Z794 Long term (current) use of insulin: Secondary | ICD-10-CM | POA: Diagnosis not present

## 2019-08-30 MED ORDER — OZEMPIC (1 MG/DOSE) 2 MG/1.5ML ~~LOC~~ SOPN: 1.0000 mg | PEN_INJECTOR | SUBCUTANEOUS | 2 refills | Status: DC

## 2019-08-30 NOTE — Patient Instructions (Addendum)
Ozempic 18 dashes on pen for 2 shots and then go 1mg  dose  Take 50 Lantus after Sunday  Check blood sugars on waking up 3 days a week  Also check blood sugars about 2 hours after meals and do this after different meals by rotation  Recommended blood sugar levels on waking up are 90-130 and about 2 hours after meal is 130-160  Please bring your blood sugar monitor to each visit, thank you

## 2019-08-30 NOTE — Progress Notes (Signed)
Patient ID: [name] [name], female   DOB: 12-13-1945, 74 y.o.   MRN: [id number]            Reason for Appointment: Follow-up for Type 2 Diabetes   History of Present Illness:          Date of diagnosis of type 2 diabetes mellitus: ?  2014        Background history:   She thinks her blood sugar was 400 at the time of diagnosis but no detailed records of this are available She did have an A1c of 10.6 done in 2014 and was probably given Lantus for some time initially Apparently she was treated with various medications including metformin, Janumet and Tradgenta. Her blood sugars had improved and A1c in 2015 was down to 6.2 She tends to have diarrhea with metformin and Janumet and also she thinks it causes dry mouth Most likely Janumet was stopped in 06/2015 Glipizide was started in 8/17 when blood sugars were higher and A1c was 9%  Recent history:   INSULIN regimen is:   Lantus 50 units daily at 5 pm  Humalog: taking 0 units before meals  Non-insulin hypoglycemic drugs the patient is taking are: 0.25 mg Ozempic weekly  Her A1c is much improved at 6.1 compared to 6.7  Current management, blood sugar patterns and problems identified:  She is checking her blood sugars somewhat sporadically but has a few readings at different times although not checked recently after dinner  She says that she has changed her diet and is cutting back on carbohydrates and high-fat meal  However she has gained weight and she is not sure why  Not able to do any exercise however  No recent steroid exposure  She was told to take 1 mg Ozempic on the last visit but she is only dialing 10 markings on the pen which is likely only 0.25 mg  Lantus was increased because of high fasting readings in the last visit  Blood sugars are fairly stable throughout the day and highest reading 174 after lunch  Has not missed any doses of insulin  Recently not taking any Humalog  No hypoglycemia  Side effects from  medications have been:?  Diarrhea from metformin and Janumet  Glucose monitoring:  done 1 time a day or less        Glucometer: Contour        Blood Glucose readings    PRE-MEAL Fasting Lunch Dinner  overnight Overall  Glucose range:  99-140   105-174  121-145   Mean/median:  121   140  133 129   Previous readings:  PRE-MEAL Fasting Lunch Dinner  overnight Overall  Glucose range:  157-181   126, 203  122, 247   Mean/median:     ?   POST-MEAL PC Breakfast PC Lunch PC Dinner  Glucose range:    157-214  Mean/median:       Self-care: The diet that the patient has been following HE:NIDP, tries to limit drinks with sugar .      Typical meal intake: Breakfast at 9,  May be eggs and sausage and lunch is usually a sandwich   Dinner 7 pm               Dietician visit, most recent: 10/18                Weight history:   Wt Readings from Last 3 Encounters:  08/30/19 177 lb 12.8 oz (80.6 kg)  07/09/19  175 lb (79.4 kg)  04/05/19 158 lb 6 oz (71.8 kg)    Glycemic control:   Lab Results  Component Value Date   HGBA1C 6.1 08/28/2019   HGBA1C 6.7 (H) 04/26/2019   HGBA1C 5.7 (H) 02/01/2019   Lab Results  Component Value Date   MICROALBUR 8.3 (H) 08/28/2019   CREATININE 0.85 08/28/2019   Lab Results  Component Value Date   MICRALBCREAT 6.5 08/28/2019       Allergies as of 08/30/2019      Reactions   Codeine Anxiety, Other (See Comments)   Hallucinations, tolerates oxycodone    Macrolides And Ketolides Nausea And Vomiting   Procaine Hcl Palpitations   Aspirin Nausea And Vomiting, Other (See Comments)   Reaction:  Burns pts stomach    Benadryl [diphenhydramine] Other (See Comments)   Per MD "inhibits potency of gabapentin, lithium etc"   Diflucan [fluconazole] Other (See Comments)   Unknown reaction   Dilaudid [hydromorphone Hcl] Other (See Comments)   Migraines and nightmares    Sulfa Antibiotics Other (See Comments)   Unknown reaction      Medication List        Accurate as of August 30, 2019 11:59 PM. If you have any questions, ask your nurse or doctor.        STOP taking these medications   Caplyta 42 MG Caps Generic drug: Lumateperone Tosylate Stopped by: Elayne Snare, MD   diphenoxylate-atropine 2.5-0.025 MG tablet Commonly known as: Lomotil Stopped by: Elayne Snare, MD   docusate sodium 100 MG capsule Commonly known as: Colace Stopped by: Elayne Snare, MD   esomeprazole 40 MG capsule Commonly known as: NEXIUM Stopped by: Elayne Snare, MD   ferrous sulfate 325 (65 FE) MG tablet Commonly known as: FerrouSul Stopped by: Elayne Snare, MD   fluticasone 50 MCG/ACT nasal spray Commonly known as: FLONASE Stopped by: Elayne Snare, MD   Mesalamine 800 MG Tbec Stopped by: Elayne Snare, MD   ondansetron 4 MG tablet Commonly known as: ZOFRAN Stopped by: Elayne Snare, MD   oxyCODONE 5 MG immediate release tablet Commonly known as: Oxy IR/ROXICODONE Stopped by: Elayne Snare, MD   Ozempic (0.25 or 0.5 MG/DOSE) 2 MG/1.5ML Sopn Generic drug: Semaglutide(0.25 or 0.5MG /DOS) Replaced by: Ozempic (1 MG/DOSE) 2 MG/1.5ML Sopn Stopped by: Elayne Snare, MD   polyethylene glycol 17 g packet Commonly known as: MIRALAX / GLYCOLAX Stopped by: Elayne Snare, MD   rivaroxaban 10 MG Tabs tablet Commonly known as: Xarelto Stopped by: Elayne Snare, MD   terconazole 0.4 % vaginal cream Commonly known as: TERAZOL 7 Stopped by: Elayne Snare, MD   triamcinolone cream 0.1 % Commonly known as: KENALOG Stopped by: Elayne Snare, MD     TAKE these medications   acetaminophen 500 MG tablet Commonly known as: TYLENOL Take 1,000 mg by mouth 3 (three) times daily.   ALPRAZolam 0.5 MG tablet Commonly known as: XANAX Take 1 tablet (0.5 mg total) by mouth 4 (four) times daily as needed for anxiety.   amoxicillin 500 MG capsule Commonly known as: AMOXIL amoxicillin 500 mg capsule  take 4 capsules by mouth 1 HOUR PRIOR TO DENTAL APPOINTMENT   atorvastatin 40 MG  tablet Commonly known as: LIPITOR Take 40 mg by mouth at bedtime.   baclofen 10 MG tablet Commonly known as: LIORESAL Take 10 mg by mouth 2 (two) times daily as needed for muscle spasms.   BD Pen Needle Nano U/F 32G X 4 MM Misc Generic drug: Insulin Pen Needle USE  TO INJECT 5 TIMES DAILY AS DIRECTED   celecoxib 200 MG capsule Commonly known as: CELEBREX Take 200 mg by mouth at bedtime.   dicyclomine 10 MG capsule Commonly known as: BENTYL Take 2 capsules (20 mg total) by mouth every 8 (eight) hours as needed for spasms.   divalproex 250 MG DR tablet Commonly known as: DEPAKOTE Take 250 mg by mouth daily. Take 2 tablets (500mg  total) by mouth once daily. What changed: Another medication with the same name was removed. Continue taking this medication, and follow the directions you see here. Changed by: Elayne Snare, MD   donepezil 10 MG tablet Commonly known as: ARICEPT Take 1 tablet (10 mg total) by mouth at bedtime.   furosemide 20 MG tablet Commonly known as: LASIX Take 1 tablet (20 mg total) by mouth daily for 10 days. What changed: Another medication with the same name was removed. Continue taking this medication, and follow the directions you see here. Changed by: Elayne Snare, MD   gabapentin 600 MG tablet Commonly known as: NEURONTIN Take 600 mg by mouth 3 (three) times daily.   glucose blood test strip Commonly known as: Contour Next Test Use to test blood sugar 2 times daily   HumaLOG KwikPen 100 UNIT/ML KwikPen Generic drug: insulin lispro Inject into the skin 3 (three) times daily. Inject 6 units under the skin at breakfast and lunch and 8 units at lunch as needed.   HumaLOG KwikPen 100 UNIT/ML KwikPen Generic drug: insulin lispro INJECT 10 TO 14 UNITS THREE TIMES DAILY FOR BLOOD SUGARS OVER 180   Lantus SoloStar 100 UNIT/ML Solostar Pen Generic drug: insulin glargine Inject 54 units under the skin once daily.   levothyroxine 75 MCG tablet Commonly known  as: SYNTHROID TAKE 1 TABLET BY MOUTH DAILY BEFORE BREAKFAST   lithium carbonate 450 MG CR tablet Commonly known as: ESKALITH Take 1 tablet (450 mg total) by mouth at bedtime.   loperamide 2 MG tablet Commonly known as: IMODIUM A-D Take 2 mg by mouth 2 (two) times daily as needed for diarrhea or loose stools.   losartan 50 MG tablet Commonly known as: COZAAR Take 25 mg by mouth daily.   Melatonin 10 MG Tbcr Take 10 mg by mouth at bedtime.   memantine 10 MG tablet Commonly known as: NAMENDA TAKE 1 TABLET(10 MG) BY MOUTH EVERY EVENING   methocarbamol 500 MG tablet Commonly known as: Robaxin Take 1 tablet (500 mg total) by mouth every 6 (six) hours as needed for muscle spasms.   Microlet Lancets Misc USE DAILY AS DIRECTED   nitroGLYCERIN 0.4 MG SL tablet Commonly known as: NITROSTAT Place 1 tablet (0.4 mg total) under the tongue every 5 (five) minutes as needed for chest pain.   ondansetron 4 MG disintegrating tablet Commonly known as: Zofran ODT 4mg  ODT q4 hours prn nausea/vomit What changed:   how much to take  how to take this  when to take this  reasons to take this  additional instructions   Ozempic (1 MG/DOSE) 2 MG/1.5ML Sopn Generic drug: Semaglutide (1 MG/DOSE) Inject 0.75 mLs (1 mg total) into the skin once a week. Replaces: Ozempic (0.25 or 0.5 MG/DOSE) 2 MG/1.5ML Sopn Started by: Elayne Snare, MD   potassium chloride 10 MEQ CR capsule Commonly known as: MICRO-K Take 1 capsule (10 mEq total) by mouth daily for 10 days.   QUEtiapine 200 MG tablet Commonly known as: SEROQUEL TAKE 1 TAB PO q 5 pm and 1 tab po QHS  rOPINIRole 0.5 MG tablet Commonly known as: REQUIP Takes 4 tabs at 5 pm and 3 tabs at bedtime and 1-2 tabs prn   VITAMIN B 12 PO Take by mouth.   Vitamin D 50 MCG (2000 UT) tablet Take 2,000 Units by mouth daily.       Allergies:  Allergies  Allergen Reactions  . Codeine Anxiety and Other (See Comments)    Hallucinations,  tolerates oxycodone   . Macrolides And Ketolides Nausea And Vomiting  . Procaine Hcl Palpitations  . Aspirin Nausea And Vomiting and Other (See Comments)    Reaction:  Burns pts stomach   . Benadryl [Diphenhydramine] Other (See Comments)    Per MD "inhibits potency of gabapentin, lithium etc"  . Diflucan [Fluconazole] Other (See Comments)    Unknown reaction   . Dilaudid [Hydromorphone Hcl] Other (See Comments)    Migraines and nightmares   . Sulfa Antibiotics Other (See Comments)    Unknown reaction    Past Medical History:  Diagnosis Date  . Alcohol abuse   . Anxiety    takes Valium daily as needed and Ativan daily  . Bilateral hearing loss   . Bipolar 1 disorder ([place])    takes Lithium nightly and Synthroid daily  . Chronic back pain    DDD; "all over" (09/14/2017)  . Colitis, ischemic ([place]) 2012  . Confusion    r/t meds  . Depression    takes Prozac daily and Bupspirone   . Diverticulosis   . Dyslipidemia    takes Crestor daily  . Fibromyalgia   . Headache    "weekly" (09/14/2017)  . Hepatic steatosis 06/18/12   severe  . Hyperlipidemia   . Hypertension   . Hypothyroidism   . IBS (irritable bowel syndrome)   . Ischemic colitis ([place])   . Joint pain   . Joint swelling   . Lupus erythematosus tumidus    tumid-skin  . Migraine    "1-2/yr; maybe" (09/14/2017)  . Numbness    in right foot  . Osteoarthritis    "all over" (09/14/2017)  . Osteoarthritis cervical spine   . Osteoarthritis of hand    bilateral  . Pneumonia    "walking pneumonia several times; long time since the last time" (09/14/2017)  . Restless leg syndrome    takes Requip nightly  . Sciatica   . Type II diabetes mellitus ([place])   . Urinary frequency   . Urinary leakage   . Urinary urgency   . Urinary, incontinence, stress female   . Walking pneumonia    last time more than 50yrs ago    Past Surgical History:  Procedure Laterality Date  . ABDOMINAL HYSTERECTOMY     "they left my ovaries"  .  APPENDECTOMY    . CARDIAC CATHETERIZATION  09/14/2017  . COLONOSCOPY    . DENTAL SURGERY Left 10/2016   dental implant  . DILATION AND CURETTAGE OF UTERUS  X 4  . ESOPHAGOGASTRODUODENOSCOPY    . FLEXIBLE SIGMOIDOSCOPY N/A 06/21/2012   Procedure: FLEXIBLE SIGMOIDOSCOPY;  Surgeon: Jerene Bears, MD;  Location: [facility];  Service: Gastroenterology;  Laterality: N/A;  . JOINT REPLACEMENT    . LEFT HEART CATH AND CORONARY ANGIOGRAPHY N/A 09/14/2017   Procedure: LEFT HEART CATH AND CORONARY ANGIOGRAPHY;  Surgeon: Belva Crome, MD;  Location: [facility];  Service: Cardiovascular;  Laterality: N/A;  . SHOULDER ARTHROSCOPY Right    "shaved spurs off rotator cuff"  . TONSILLECTOMY    .  TOTAL HIP ARTHROPLASTY Right 06/09/2013   Procedure: TOTAL HIP ARTHROPLASTY;  Surgeon: Kerin Salen, MD;  Location: [facility];  Service: Orthopedics;  Laterality: Right;  . TOTAL KNEE ARTHROPLASTY Left 02/07/2019   Procedure: TOTAL KNEE ARTHROPLASTY;  Surgeon: Paralee Cancel, MD;  Location: [facility];  Service: Orthopedics;  Laterality: Left;  70 mins  . TUBAL LIGATION    . TUMOR EXCISION Right 1968   angle of jaw; benign    Family History  Problem Relation Age of Onset  . Drug abuse Mother   . Alcohol abuse Mother   . Alcohol abuse Father   . Hypertension Father   . CAD Brother   . Hypertension Brother   . Alcohol abuse Brother   . Hypertension Brother   . Hypertension Brother   . Psoriasis Daughter   . Arthritis Daughter        psoriatic arthritis   . Alcohol abuse Grandchild     Social History:  reports that she has never smoked. She has never used smokeless tobacco. She reports previous alcohol use. She reports previous drug use. Frequency: 7.00 times per week. Drug: Benzodiazepines.   Review of Systems    Lipid history: On Lipitor 40 mg prescribed by her PCP Last LDL was high and just above 100  Triglycerides last over 200  Taking OTC fish oil also   Lab Results  Component Value Date    CHOL 206 (H) 08/28/2019   HDL 53.30 08/28/2019   LDLDIRECT 116.0 08/28/2019   TRIG 259.0 (H) 08/28/2019   CHOLHDL 4 08/28/2019            Most recent eye exam was In 03/2018, negative  Most recent foot exam: 6/21  THYROID: She has been on levothyroxine and Cytomel from her psychiatrist for several years with uncertain diagnosis She is taking levothyroxine 75 g, with Cytomel 25 g  Also on lithium  Her TSH is consistently normal Free T3 and free T4 are normal  Labs as follows:  Lab Results  Component Value Date   TSH 1.37 08/28/2019   TSH 1.02 04/26/2019   TSH 1.35 07/04/2018   FREET4 0.90 08/28/2019   FREET4 1.01 04/26/2019   FREET4 0.77 07/04/2018   Lab Results  Component Value Date   T3FREE 3.2 08/28/2019   T3FREE 2.7 04/26/2019   T3FREE 2.5 07/04/2018   T3FREE 2.3 12/21/2017   T3FREE 3.0 03/22/2017    She has atypical depression on multiple drugs, has been on Seroquel for quite some time  THYROID nodule:  She was found to have a nodule in her thyroid isthmus in 8/20 Ultrasound done in 1/21 showed multinodular goiter with only 1 significant nodule as below  Right mid thyroid nodule (labeled 2, TR 3) meets criteria for surveillance  Previously had mild hypercalcemia, last levels:  Lab Results  Component Value Date   PTH 35 10/25/2018   CALCIUM 10.5 08/28/2019   CAION 1.30 07/14/2016   PHOS 2.9 12/26/2010   She is asking about muscle cramps in her legs especially left lower leg Periodically feels some numbness in her toes    Physical Examination:  BP 108/70 (BP Location: Left Arm, Patient Position: Sitting, Cuff Size: Normal)   Pulse 87   Ht 5\' 2"  (1.575 m)   Wt 177 lb 12.8 oz (80.6 kg)   SpO2 94%   BMI 32.52 kg/m   Diabetic Foot Exam - Simple   Simple Foot Form Diabetic Foot exam was performed with the following findings: Yes  08/30/2019 10:46 AM  Visual Inspection No deformities, no ulcerations, no other skin breakdown bilaterally:  Yes Sensation Testing See comments: Yes Pulse Check Posterior Tibialis and Dorsalis pulse intact bilaterally: Yes Comments Toe sensation less    No ankle edema present    ASSESSMENT:  Diabetes type 2, on insulin  See history of present illness for detailed discussion of current diabetes management, blood sugar patterns, monitor download and problems identified  She is on Ozempic and Lantus, without Humalog  Her A1c is back down to 6.1  Most of her improvement in blood sugars is likely related to her generally watching her diet better and able to control portions better now She did not increase her Ozempic and will need to do this to help her with weight loss Fasting readings are controlled with current 54 units dose of Lantus  Probable secondary hypothyroidism: Free T4, T3 and TSH normal  She is on a stable combination of Cytomel and levothyroxine as before and will continue  Neuropathy: She has mild sensory loss  No microalbuminuria   PLAN:  She will go up on her Ozempic dose and with the 1 mg Ozempic device she will done of 18 directions for the next 2 injections and then go up to the 1 mg dose weekly If her blood sugars are consistently lower in the mornings she will need to reduce insulin but as a precaution we will reduce it to 50 units Lantus after her next injection She does need to start walking or other exercises More readings after dinner to recheck   No change in thyroid medications    Follow-up in 3 months  Patient Instructions  Ozempic 18 dashes on pen for 2 shots and then go 1mg  dose  Take 50 Lantus after Sunday  Check blood sugars on waking up 3 days a week  Also check blood sugars about 2 hours after meals and do this after different meals by rotation  Recommended blood sugar levels on waking up are 90-130 and about 2 hours after meal is 130-160  Please bring your blood sugar monitor to each visit, thank you             Elayne Snare 08/31/2019, 9:22 AM   Note: This office note was prepared with Dragon voice recognition system technology. Any transcriptional errors that result from this process are unintentional.

## 2019-09-04 ENCOUNTER — Other Ambulatory Visit: Payer: Self-pay

## 2019-09-04 MED ORDER — DIVALPROEX SODIUM 250 MG PO DR TAB: 500.0000 mg | DELAYED_RELEASE_TABLET | Freq: Every day | ORAL | 0 refills | Status: DC

## 2019-09-08 ENCOUNTER — Telehealth: Payer: Self-pay

## 2019-09-08 NOTE — Telephone Encounter (Signed)
PA initiated via [email or web address] for Ozempic 1mg  injection once weekly.  Tracye Towery (Key: [id number]) Rx #: [id number] Ozempic (1 MG/DOSE) 4MG [employer] Mediate pen-injectors   Form: [employer] Medicare Part D General Authorization Form Sent to Plan: 23 minutes ago Determination: Wait for Determination Please wait for [employer] [place] [employer] central to return a determination.   FRITZIE PRIOLEAU (Key: [id number])  Your information has been submitted to [place]. [employer] [place] will review the request and notify you of the determination decision directly, typically within 3 business days of your submission and once all necessary information is received.  You will also receive your request decision electronically. To check for an update later, open the request again from your dashboard.  If [employer] [place] has not responded within the specified timeframe or if you have any questions about your PA submission, contact [place] directly at [facility]) [phone] or ([place]) [phone].

## 2019-09-08 NOTE — Telephone Encounter (Signed)
PA for Ozempic has been approved.  Your request has been approved Effective from 09/08/2019 through 09/07/2020.

## 2019-09-08 NOTE — Telephone Encounter (Signed)
[name] [name] with [facility] Medicare called stating the Ozempic was approved for 1 year - starting today.  Asked to please fax this approval to [place].

## 2019-09-10 ENCOUNTER — Encounter (HOSPITAL_COMMUNITY): Payer: Self-pay

## 2019-09-10 ENCOUNTER — Emergency Department (HOSPITAL_COMMUNITY)
Admission: EM | Admit: 2019-09-10 | Discharge: 2019-09-10 | Disposition: A | Discharge status: Home / Self Care | Payer: Medicare Other | Attending: Emergency Medicine | Admitting: Emergency Medicine

## 2019-09-10 ENCOUNTER — Other Ambulatory Visit: Payer: Self-pay

## 2019-09-10 ENCOUNTER — Emergency Department (HOSPITAL_COMMUNITY): Payer: Medicare Other

## 2019-09-10 DIAGNOSIS — E119 Type 2 diabetes mellitus without complications: Secondary | ICD-10-CM | POA: Insufficient documentation

## 2019-09-10 DIAGNOSIS — I10 Essential (primary) hypertension: Secondary | ICD-10-CM | POA: Insufficient documentation

## 2019-09-10 DIAGNOSIS — M25562 Pain in left knee: Secondary | ICD-10-CM | POA: Diagnosis present

## 2019-09-10 DIAGNOSIS — Z79899 Other long term (current) drug therapy: Secondary | ICD-10-CM | POA: Insufficient documentation

## 2019-09-10 DIAGNOSIS — G8929 Other chronic pain: Secondary | ICD-10-CM | POA: Insufficient documentation

## 2019-09-10 DIAGNOSIS — Z794 Long term (current) use of insulin: Secondary | ICD-10-CM | POA: Insufficient documentation

## 2019-09-10 DIAGNOSIS — E039 Hypothyroidism, unspecified: Secondary | ICD-10-CM | POA: Diagnosis not present

## 2019-09-10 MED ORDER — OXYCODONE-ACETAMINOPHEN 10-325 MG PO TABS: 1.0000 | ORAL_TABLET | ORAL | 0 refills | Status: DC | PRN

## 2019-09-10 MED ORDER — OXYCODONE-ACETAMINOPHEN 10-325 MG PO TABS: 1.0000 | ORAL_TABLET | ORAL | 0 refills | Status: AC | PRN

## 2019-09-10 MED ORDER — MORPHINE SULFATE (PF) 10 MG/ML IV SOLN: 10.0000 mg | Freq: Once | INTRAVENOUS | Status: DC

## 2019-09-10 MED ORDER — MORPHINE SULFATE (PF) 10 MG/ML IV SOLN
10.0000 mg | Freq: Once | INTRAVENOUS | Status: AC
  Administered 2019-09-10: 10 mg via INTRAMUSCULAR

## 2019-09-10 NOTE — Discharge Instructions (Signed)
Please take the pain medication as prescribed.  Please make sure to follow-up with your orthopedic doctor as discussed.  Return to the ER if your symptoms worsen.

## 2019-09-10 NOTE — ED Triage Notes (Signed)
Pt sts left total knee replacement in December and pain is still uncontrollable. Pain became worst last night.

## 2019-09-10 NOTE — ED Notes (Addendum)
Ambulate PT in hall. PT c/o knee pain with little SOB stat 95% pulse 100

## 2019-09-10 NOTE — ED Notes (Signed)
Sat 90-94%RA. Pt sts she has had intermittent SHOB since yesterday. Provider made aware.

## 2019-09-10 NOTE — ED Provider Notes (Addendum)
[facility] DEPT Provider Note   CSN: [id number] Arrival date & time: 09/10/19  7622     History Chief Complaint  Patient presents with  . Knee Pain    [name] [name] is a 74 y.o. female.  HPI 74 year old female with a history of bipolar 1 disorder, type 2 diabetes, hypothyroidism, alcohol abuse, chronic back pain, hypertension with a left knee replacement in December presents to the ER with left knee pain.  Patient reports that ever since her surgery she has been having uncontrolled pain in the left knee.  Last night she was unable to get any sleep, which is what brought her into the ER.  She denies any increase in pain, and has remained consistent over the last 7 months.  She has discussed this with her orthopedic doctor, but states that she was discharged from the practice after having successful surgery.  She denies any fevers, chills, left leg swelling, drainage, numbness or tingling, weakness in the extremity.  Total left knee replacement performed by Dr. Alvan Dame in December 2020    Past Medical History:  Diagnosis Date  . Alcohol abuse   . Anxiety    takes Valium daily as needed and Ativan daily  . Bilateral hearing loss   . Bipolar 1 disorder ([place])    takes Lithium nightly and Synthroid daily  . Chronic back pain    DDD; "all over" (09/14/2017)  . Colitis, ischemic ([place]) 2012  . Confusion    r/t meds  . Depression    takes Prozac daily and Bupspirone   . Diverticulosis   . Dyslipidemia    takes Crestor daily  . Fibromyalgia   . Headache    "weekly" (09/14/2017)  . Hepatic steatosis 06/18/12   severe  . Hyperlipidemia   . Hypertension   . Hypothyroidism   . IBS (irritable bowel syndrome)   . Ischemic colitis ([place])   . Joint pain   . Joint swelling   . Lupus erythematosus tumidus    tumid-skin  . Migraine    "1-2/yr; maybe" (09/14/2017)  . Numbness    in right foot  . Osteoarthritis    "all over" (09/14/2017)  . Osteoarthritis cervical  spine   . Osteoarthritis of hand    bilateral  . Pneumonia    "walking pneumonia several times; long time since the last time" (09/14/2017)  . Restless leg syndrome    takes Requip nightly  . Sciatica   . Type II diabetes mellitus ([place])   . Urinary frequency   . Urinary leakage   . Urinary urgency   . Urinary, incontinence, stress female   . Walking pneumonia    last time more than 40yrs ago    Patient Active Problem List   Diagnosis Date Noted  . Overweight (BMI 25.0-29.9) 02/08/2019  . S/P left TKA 02/07/2019  . Chest pain   . Chest pain with normal coronary angiography   . Diverticulitis 11/19/2016  . Hypothyroidism 07/14/2016  . Diarrhea   . Generalized abdominal pain   . Major depressive disorder, recurrent severe without psychotic features ([place]) 12/31/2015  . Nausea without vomiting   . Colitis 10/28/2015  . Unintentional poisoning by psychotropic drug 07/05/2015  . Restless leg syndrome 07/05/2015  . Diabetic polyneuropathy associated with type 2 diabetes mellitus ([place]) 09/21/2014  . Arthritis of left hip 06/09/2013  . Arthritis of right hip 06/08/2013  . Hip pain, right 05/02/2013  . IBS (irritable bowel syndrome) 01/02/2013  . Unspecified vitamin D  deficiency 07/05/2012  . Pure hyperglyceridemia 07/04/2012  . Diabetes mellitus with diabetic neuropathy, without long-term current use of insulin ([place]) 06/18/2012  . Hypertension 06/18/2012  . Bipolar 1 disorder ([place]) 06/18/2012  . Low back pain 02/25/2011  . Acute ischemic colitis ([place]) 01/06/2011  . Sciatica 12/19/2010  . Fibromyalgia 10/28/2010  . Primary osteoarthritis of right hip 10/24/2010  . Hyperlipidemia with target low density lipoprotein (LDL) cholesterol less than 100 mg/dL 05/05/2010  . DEPRESSION/ANXIETY 05/05/2010  . ABUSE, ALCOHOL, IN REMISSION 05/05/2010  . OTITIS MEDIA, CHRONIC 05/05/2010  . Hearing loss 05/05/2010  . ALLERGIC RHINITIS, SEASONAL, MILD 05/05/2010  . URINARY INCONTINENCE,  STRESS, FEMALE 05/05/2010  . OSTEOARTHRITIS, HANDS, BILATERAL 05/05/2010  . OSTEOARTHRITIS, CERVICAL SPINE 05/05/2010    Past Surgical History:  Procedure Laterality Date  . ABDOMINAL HYSTERECTOMY     "they left my ovaries"  . APPENDECTOMY    . CARDIAC CATHETERIZATION  09/14/2017  . COLONOSCOPY    . DENTAL SURGERY Left 10/2016   dental implant  . DILATION AND CURETTAGE OF UTERUS  X 4  . ESOPHAGOGASTRODUODENOSCOPY    . FLEXIBLE SIGMOIDOSCOPY N/A 06/21/2012   Procedure: FLEXIBLE SIGMOIDOSCOPY;  Surgeon: Jerene Bears, MD;  Location: [facility];  Service: Gastroenterology;  Laterality: N/A;  . JOINT REPLACEMENT    . LEFT HEART CATH AND CORONARY ANGIOGRAPHY N/A 09/14/2017   Procedure: LEFT HEART CATH AND CORONARY ANGIOGRAPHY;  Surgeon: Belva Crome, MD;  Location: [facility];  Service: Cardiovascular;  Laterality: N/A;  . SHOULDER ARTHROSCOPY Right    "shaved spurs off rotator cuff"  . TONSILLECTOMY    . TOTAL HIP ARTHROPLASTY Right 06/09/2013   Procedure: TOTAL HIP ARTHROPLASTY;  Surgeon: Kerin Salen, MD;  Location: [facility];  Service: Orthopedics;  Laterality: Right;  . TOTAL KNEE ARTHROPLASTY Left 02/07/2019   Procedure: TOTAL KNEE ARTHROPLASTY;  Surgeon: Paralee Cancel, MD;  Location: [facility];  Service: Orthopedics;  Laterality: Left;  70 mins  . TUBAL LIGATION    . TUMOR EXCISION Right 1968   angle of jaw; benign     OB History   No obstetric history on file.     Family History  Problem Relation Age of Onset  . Drug abuse Mother   . Alcohol abuse Mother   . Alcohol abuse Father   . Hypertension Father   . CAD Brother   . Hypertension Brother   . Alcohol abuse Brother   . Hypertension Brother   . Hypertension Brother   . Psoriasis Daughter   . Arthritis Daughter        psoriatic arthritis   . Alcohol abuse Grandchild     Social History   Tobacco Use  . Smoking status: Never Smoker  . Smokeless tobacco: Never Used  Vaping Use  . Vaping Use: Never used    Substance Use Topics  . Alcohol use: Not Currently    Comment: 09/14/2017 "nothing since 04/15/2008"  . Drug use: Not Currently    Frequency: 7.0 times per week    Types: Benzodiazepines    Home Medications Prior to Admission medications   Medication Sig Start Date End Date Taking? Authorizing Provider  acetaminophen (TYLENOL) 500 MG tablet Take 1,000 mg by mouth 3 (three) times daily.    [provider]  ALPRAZolam Duanne Moron) 0.5 MG tablet Take 1 tablet (0.5 mg total) by mouth 4 (four) times daily as needed for anxiety. 08/03/19   [name], PMHNP  amoxicillin (AMOXIL) 500 MG capsule amoxicillin 500  mg capsule  take 4 capsules by mouth 1 HOUR PRIOR TO DENTAL APPOINTMENT    [provider]  atorvastatin (LIPITOR) 40 MG tablet Take 40 mg by mouth at bedtime.     [provider]  baclofen (LIORESAL) 10 MG tablet Take 10 mg by mouth 2 (two) times daily as needed for muscle spasms.    [provider]  BD PEN NEEDLE NANO U/F 32G X 4 MM MISC USE TO INJECT 5 TIMES DAILY AS DIRECTED 05/05/19   Elayne Snare, MD  celecoxib (CELEBREX) 200 MG capsule Take 200 mg by mouth at bedtime.    [provider]  Cholecalciferol (VITAMIN D) 50 MCG (2000 UT) tablet Take 2,000 Units by mouth daily.    [provider]  Cyanocobalamin (VITAMIN B 12 PO) Take by mouth.    [provider]  dicyclomine (BENTYL) 10 MG capsule Take 2 capsules (20 mg total) by mouth every 8 (eight) hours as needed for spasms. 04/17/19   Noralyn Pick, NP  divalproex (DEPAKOTE) 250 MG DR tablet Take 2 tablets (500 mg total) by mouth at bedtime. 09/04/19   [name], PMHNP  donepezil (ARICEPT) 10 MG tablet Take 1 tablet (10 mg total) by mouth at bedtime. 06/26/19   [name], PMHNP  furosemide (LASIX) 20 MG tablet Take 1 tablet (20 mg total) by mouth daily for 10 days. 07/09/19 07/19/19  Wyvonnia Dusky, MD  gabapentin (NEURONTIN) 600 MG tablet Take 600 mg by mouth  3 (three) times daily. 05/12/19   [provider]  glucose blood (CONTOUR NEXT TEST) test strip Use to test blood sugar 2 times daily 04/28/18   Elayne Snare, MD  HUMALOG KWIKPEN 100 UNIT/ML KwikPen INJECT 10 TO 14 UNITS THREE TIMES DAILY FOR BLOOD SUGARS OVER 180 05/22/19   Elayne Snare, MD  Insulin Glargine (LANTUS SOLOSTAR) 100 UNIT/ML Solostar Pen Inject 54 units under the skin once daily. 05/05/19   Elayne Snare, MD  insulin lispro (HUMALOG KWIKPEN) 100 UNIT/ML KwikPen Inject into the skin 3 (three) times daily. Inject 6 units under the skin at breakfast and lunch and 8 units at lunch as needed.    [provider]  levothyroxine (SYNTHROID) 75 MCG tablet TAKE 1 TABLET BY MOUTH DAILY BEFORE BREAKFAST 07/31/19   Elayne Snare, MD  lithium carbonate (ESKALITH) 450 MG CR tablet Take 1 tablet (450 mg total) by mouth at bedtime. 08/03/19   [name], PMHNP  loperamide (IMODIUM A-D) 2 MG tablet Take 2 mg by mouth 2 (two) times daily as needed for diarrhea or loose stools.    [provider]  losartan (COZAAR) 50 MG tablet Take 25 mg by mouth daily.     [provider]  Melatonin 10 MG TBCR Take 10 mg by mouth at bedtime.    [provider]  memantine (NAMENDA) 10 MG tablet TAKE 1 TABLET(10 MG) BY MOUTH EVERY EVENING 08/03/19   [name], PMHNP  methocarbamol (ROBAXIN) 500 MG tablet Take 1 tablet (500 mg total) by mouth every 6 (six) hours as needed for muscle spasms. 02/08/19   Danae Orleans, PA-C  Microlet Lancets MISC USE DAILY AS DIRECTED 10/24/18   Elayne Snare, MD  nitroGLYCERIN (NITROSTAT) 0.4 MG SL tablet Place 1 tablet (0.4 mg total) under the tongue every 5 (five) minutes as needed for chest pain. 09/14/17   Lyda Jester M, PA-C  ondansetron (ZOFRAN ODT) 4 MG disintegrating tablet 4mg  ODT q4 hours prn nausea/vomit Patient taking differently: Take 4  mg by mouth every 6 (six) hours as needed for nausea or vomiting.  05/19/18   Milton Ferguson, MD   oxyCODONE-acetaminophen (PERCOCET) 10-325 MG tablet Take 1 tablet by mouth every 4 (four) hours as needed for up to 5 days for pain. 09/10/19 09/15/19  Garald Balding, PA-C  potassium chloride (MICRO-K) 10 MEQ CR capsule Take 1 capsule (10 mEq total) by mouth daily for 10 days. 07/09/19 07/19/19  Wyvonnia Dusky, MD  QUEtiapine (SEROQUEL) 200 MG tablet TAKE 1 TAB PO q 5 pm and 1 tab po QHS 06/30/19   [name], PMHNP  rOPINIRole (REQUIP) 0.5 MG tablet Takes 4 tabs at 5 pm and 3 tabs at bedtime and 1-2 tabs prn 07/25/19   [name], PMHNP  Semaglutide, 1 MG/DOSE, (OZEMPIC, 1 MG/DOSE,) 2 MG/1.5ML SOPN Inject 0.75 mLs (1 mg total) into the skin once a week. 08/30/19   Elayne Snare, MD    Allergies    Codeine, Macrolides and ketolides, Procaine hcl, Aspirin, Benadryl [diphenhydramine], Diflucan [fluconazole], Dilaudid [hydromorphone hcl], and Sulfa antibiotics  Review of Systems   Review of Systems  Constitutional: Negative for chills and fever.  Musculoskeletal: Positive for arthralgias (Left knee pain). Negative for gait problem and myalgias.  Skin: Negative for color change and wound.  Neurological: Negative for weakness and numbness.    Physical Exam Updated Vital Signs BP 133/77   Pulse 69   Temp 97.7 F (36.5 C) (Oral)   Resp 16   Ht 5\' 2"  (1.575 m)   Wt 79.4 kg   SpO2 94%   BMI 32.01 kg/m   Physical Exam Vitals and nursing note reviewed.  Constitutional:      General: She is not in acute distress.    Appearance: Normal appearance. She is well-developed. She is not ill-appearing or diaphoretic.  HENT:     Head: Normocephalic and atraumatic.     Nose: Nose normal.  Eyes:     Conjunctiva/sclera: Conjunctivae normal.     Pupils: Pupils are equal, round, and reactive to light.  Cardiovascular:     Rate and Rhythm: Normal rate and regular rhythm.     Pulses: Normal pulses.     Heart sounds: Normal heart sounds. No murmur heard.   Pulmonary:     Effort: Pulmonary  effort is normal. No respiratory distress.     Breath sounds: Normal breath sounds.  Abdominal:     General: Abdomen is flat.     Palpations: Abdomen is soft.     Tenderness: There is no abdominal tenderness.  Musculoskeletal:        General: No swelling, tenderness or deformity.     Cervical back: Normal range of motion and neck supple.     Right lower leg: No edema.     Left lower leg: No edema.     Comments: Left knee with no erythema, swelling.  Not tender to palpation.  No fluctuance, drainage.  Well-healed scar from knee replacement.  Full range of motion, although with slight pain.  5/5 strength in lower extremities bilaterally.  Gross sensations intact.  Skin:    General: Skin is warm and dry.     Capillary Refill: Capillary refill takes less than 2 seconds.     Findings: No erythema.  Neurological:     General: No focal deficit present.     Mental Status: She is alert and oriented to person, place, and time.     Sensory: No sensory deficit.     Motor:  No weakness.  Psychiatric:        Mood and Affect: Mood normal.        Behavior: Behavior normal.     ED Results / Procedures / Treatments   Labs (all labs ordered are listed, but only abnormal results are displayed) Labs Reviewed - No data to display  EKG None  Radiology DG Knee Complete 4 Views Left  Result Date: 09/10/2019 CLINICAL DATA:  Left knee pain EXAM: LEFT KNEE - COMPLETE 4+ VIEW COMPARISON:  None. FINDINGS: Left knee replacement is identified without malalignment. No acute fracture or dislocation is identified. IMPRESSION: Left knee replacement without malalignment. No acute fracture or dislocation noted. Electronically Signed   By: Abelardo Diesel M.D.   On: 09/10/2019 08:09    Procedures Procedures (including critical care time)  Medications Ordered in ED Medications  morphine sulfate (PF) 10 MG/ML injection 10 mg (10 mg Intramuscular Given 09/10/19 0820)    ED Course  I have reviewed the triage vital  signs and the nursing notes.  Pertinent labs & imaging results that were available during my care of the patient were reviewed by me and considered in my medical decision making (see chart for details).    MDM Rules/Calculators/A&P                          74 year old female with total left knee replacement in December, presents with consistent and unchanged chronic pain in her left knee On physical exam, she has full range of motion of the knee, no warmth or swelling.  No fluctuance or drainage.  No evidence of infection.  She was given morphine here in the ER, notes some improvement to her pain.  We will send her a short course of Percocet, as she reports that she cannot take hydrocodone.  I reviewed her PDMP, she was given oxycodone after her surgery.  She has no current prescriptions for opioids at this time.  I encouraged her to follow-up with Dr. Alvan Dame or her PCP for further chronic pain management.  Do not think she needs any other emergent interventions or lab work at this time.  Return precautions given.   At discharge, nursing staff noted that the patient's saturations were fluctuating between 90% and 94%.  Upon further questioning, she states that she has been feeling a little short of breath over the last few days.  However she does state that she does have some chronic shortness of breath.  Denies any chest pain, or pleuritic symptoms.  Suspicion for PE is low as her surgery was over 6 months ago.  She also had a DVT ultrasound recently per chart review which was negative for clot.  Patient was ambulated in the ED, and her sats stayed above 95%.  She is stable for discharge with follow-up with her PCP.   At this stage in ED course, the patient is medically screened and stable for discharge.  Patient seen and evaluated by Dr. Zenia Resides who is agreeable to the above plan and disposition. Final Clinical Impression(s) / ED Diagnoses Final diagnoses:  Chronic pain of left knee    Rx / DC  Orders ED Discharge Orders         Ordered    oxyCODONE-acetaminophen (PERCOCET) 10-325 MG tablet  Every 4 hours PRN     Discontinue  Reprint     09/10/19 0933           Garald Balding, PA-C 09/10/19 [phone]  Lacretia Leigh, MD 09/10/19 1006    Garald Balding, PA-C 09/10/19 1017    Lacretia Leigh, MD 09/10/19 1524

## 2019-09-10 NOTE — ED Notes (Signed)
Dr. Zenia Resides and PA made aware of Sat while ambulating. Providers cleared pt to be d/c'd to home.

## 2019-09-12 ENCOUNTER — Other Ambulatory Visit: Payer: Self-pay | Admitting: Gastroenterology

## 2019-09-12 DIAGNOSIS — R1310 Dysphagia, unspecified: Secondary | ICD-10-CM

## 2019-09-18 ENCOUNTER — Ambulatory Visit
Admission: RE | Admit: 2019-09-18 | Discharge: 2019-09-18 | Disposition: A | Discharge status: Home / Self Care | Payer: Medicare Other | Source: Ambulatory Visit | Attending: Gastroenterology | Admitting: Gastroenterology

## 2019-09-18 DIAGNOSIS — K224 Dyskinesia of esophagus: Secondary | ICD-10-CM | POA: Diagnosis not present

## 2019-09-18 DIAGNOSIS — R1310 Dysphagia, unspecified: Secondary | ICD-10-CM

## 2019-10-01 DIAGNOSIS — R300 Dysuria: Secondary | ICD-10-CM | POA: Diagnosis not present

## 2019-10-01 DIAGNOSIS — N390 Urinary tract infection, site not specified: Secondary | ICD-10-CM | POA: Diagnosis not present

## 2019-10-04 ENCOUNTER — Other Ambulatory Visit: Payer: Self-pay

## 2019-10-04 DIAGNOSIS — F5101 Primary insomnia: Secondary | ICD-10-CM

## 2019-10-04 DIAGNOSIS — F319 Bipolar disorder, unspecified: Secondary | ICD-10-CM

## 2019-10-04 DIAGNOSIS — R1319 Other dysphagia: Secondary | ICD-10-CM | POA: Diagnosis not present

## 2019-10-04 MED ORDER — QUETIAPINE FUMARATE 200 MG PO TABS: ORAL_TABLET | ORAL | 0 refills | Status: DC

## 2019-10-09 ENCOUNTER — Ambulatory Visit (INDEPENDENT_AMBULATORY_CARE_PROVIDER_SITE_OTHER): Payer: Medicare Other | Admitting: Psychiatry

## 2019-10-09 ENCOUNTER — Encounter: Payer: Self-pay | Admitting: Psychiatry

## 2019-10-09 ENCOUNTER — Other Ambulatory Visit: Payer: Self-pay

## 2019-10-09 DIAGNOSIS — Z79899 Other long term (current) drug therapy: Secondary | ICD-10-CM

## 2019-10-09 DIAGNOSIS — F319 Bipolar disorder, unspecified: Secondary | ICD-10-CM | POA: Diagnosis not present

## 2019-10-09 DIAGNOSIS — G2581 Restless legs syndrome: Secondary | ICD-10-CM | POA: Diagnosis not present

## 2019-10-09 DIAGNOSIS — F5101 Primary insomnia: Secondary | ICD-10-CM

## 2019-10-09 MED ORDER — DONEPEZIL HCL 10 MG PO TABS: 10.0000 mg | ORAL_TABLET | Freq: Every day | ORAL | 0 refills | Status: DC

## 2019-10-09 MED ORDER — QUETIAPINE FUMARATE 200 MG PO TABS: ORAL_TABLET | ORAL | 0 refills | Status: DC

## 2019-10-09 MED ORDER — MEMANTINE HCL 10 MG PO TABS: ORAL_TABLET | ORAL | 0 refills | Status: DC

## 2019-10-09 MED ORDER — LITHIUM CARBONATE ER 450 MG PO TBCR: 450.0000 mg | EXTENDED_RELEASE_TABLET | Freq: Every day | ORAL | 0 refills | Status: DC

## 2019-10-09 MED ORDER — ROPINIROLE HCL 0.5 MG PO TABS: ORAL_TABLET | ORAL | 0 refills | Status: DC

## 2019-10-09 NOTE — Progress Notes (Signed)
   10/09/19 1015  Facial and Oral Movements  Muscles of Facial Expression 0  Lips and Perioral Area 0  Jaw 0  Tongue 0  Extremity Movements  Upper (arms, wrists, hands, fingers) 0  Lower (legs, knees, ankles, toes) 0  Trunk Movements  Neck, shoulders, hips 0  Overall Severity  Severity of abnormal movements (highest score from questions above) 0  Incapacitation due to abnormal movements 0  Patient's awareness of abnormal movements (rate only patient's report) 0  Dental Status  Current problems with teeth and/or dentures? No  AIMS Total Score  AIMS Total Score 0
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
## 2019-10-10 DIAGNOSIS — E119 Type 2 diabetes mellitus without complications: Secondary | ICD-10-CM | POA: Diagnosis not present

## 2019-10-10 DIAGNOSIS — H5203 Hypermetropia, bilateral: Secondary | ICD-10-CM | POA: Diagnosis not present

## 2019-10-10 DIAGNOSIS — H52223 Regular astigmatism, bilateral: Secondary | ICD-10-CM | POA: Diagnosis not present

## 2019-10-10 LAB — BASIC METABOLIC PANEL
BUN: 14 mg/dL (ref 7–25)
CO2: 26 mmol/L (ref 20–32)
Calcium: 10.5 mg/dL — ABNORMAL HIGH (ref 8.6–10.4)
Chloride: 104 mmol/L (ref 98–110)
Creat: 0.85 mg/dL (ref 0.60–0.93)
Glucose, Bld: 120 mg/dL (ref 65–139)
Potassium: 4.8 mmol/L (ref 3.5–5.3)
Sodium: 137 mmol/L (ref 135–146)

## 2019-10-10 LAB — LITHIUM LEVEL: Lithium Lvl: 0.7 mmol/L (ref 0.6–1.2)

## 2019-10-12 ENCOUNTER — Telehealth: Payer: Self-pay

## 2019-10-12 DIAGNOSIS — R300 Dysuria: Secondary | ICD-10-CM | POA: Diagnosis not present

## 2019-10-12 DIAGNOSIS — R350 Frequency of micturition: Secondary | ICD-10-CM | POA: Diagnosis not present

## 2019-10-12 NOTE — Telephone Encounter (Signed)
error 

## 2019-10-12 NOTE — Telephone Encounter (Signed)
FAXED [place]: [facility] for testing supplies Other records requested: None requested  All above requested information has been faxed successfully to [employer] listed above. Documents and fax confirmation have been placed in the faxed file for future reference.

## 2019-10-17 ENCOUNTER — Other Ambulatory Visit: Payer: Self-pay | Admitting: Endocrinology

## 2019-10-20 ENCOUNTER — Ambulatory Visit (INDEPENDENT_AMBULATORY_CARE_PROVIDER_SITE_OTHER): Payer: Medicare Other | Admitting: Podiatry

## 2019-10-20 ENCOUNTER — Encounter: Payer: Self-pay | Admitting: Podiatry

## 2019-10-20 ENCOUNTER — Other Ambulatory Visit: Payer: Self-pay

## 2019-10-20 DIAGNOSIS — M79674 Pain in right toe(s): Secondary | ICD-10-CM

## 2019-10-20 DIAGNOSIS — N390 Urinary tract infection, site not specified: Secondary | ICD-10-CM | POA: Diagnosis not present

## 2019-10-20 DIAGNOSIS — B351 Tinea unguium: Secondary | ICD-10-CM | POA: Diagnosis not present

## 2019-10-20 DIAGNOSIS — R6889 Other general symptoms and signs: Secondary | ICD-10-CM | POA: Diagnosis not present

## 2019-10-20 DIAGNOSIS — L600 Ingrowing nail: Secondary | ICD-10-CM | POA: Diagnosis not present

## 2019-10-20 DIAGNOSIS — M79675 Pain in left toe(s): Secondary | ICD-10-CM | POA: Diagnosis not present

## 2019-10-23 ENCOUNTER — Encounter: Payer: Self-pay | Admitting: Podiatry

## 2019-10-23 NOTE — Progress Notes (Signed)
  Subjective:  Patient ID: [name] [name], female    DOB: 09-23-1945,  MRN: [id number]  Chief Complaint  Patient presents with  . Nail Problem    the right big toe is sore on the medial side and has some fungus to it   74 y.o. female returns for the above complaint.  Patient presents with thickened elongated dystrophic toenails x10.  Patient would like to have them debrided down.  She states that she has not been able to debride herself.  She would like some help taking them down.  She denies any other acute complaints.  She states that the right medial ingrown is doing well is not hurting as much however she just wants to get it checked out that it has been a bit of a discomfort.  There has not been any purulent drainage around the like that.  Objective:  There were no vitals filed for this visit. Podiatric Exam: Vascular: dorsalis pedis and posterior tibial pulses are palpable bilateral. Capillary return is immediate. Temperature gradient is WNL. Skin turgor WNL  Sensorium: Normal Semmes Weinstein monofilament test. Normal tactile sensation bilaterally. Nail Exam: Pt has thick disfigured discolored nails with subungual debris noted bilateral entire nail hallux through fifth toenails.  Pain on palpation to the nails. Ulcer Exam: There is no evidence of ulcer or pre-ulcerative changes or infection. Orthopedic Exam: Muscle tone and strength are WNL. No limitations in general ROM. No crepitus or effusions noted. HAV  B/L.  Hammer toes 2-5  B/L. Skin: No Porokeratosis. No infection or ulcers    Assessment & Plan:   1. Ingrown toenail of right foot   2. Great toe pain, right   3. Pain due to onychomycosis of toenails of both feet     Patient was evaluated and treated and all questions answered.  Right medial ingrown -Clinically appears to be without any signs of infection including purulent drainage or redness.  I discussed with the patient that at this time we can continue monitoring  given her pain has gone down if the pain returns possibly secondary to recurrence of the ingrown we can discuss removing at that time.  Patient states understanding.  Onychomycosis with pain  -Nails palliatively debrided as below. -Educated on self-care  Procedure: Nail Debridement Rationale: pain  Type of Debridement: manual, sharp debridement. Instrumentation: Nail nipper, rotary burr. Number of Nails: 10  Procedures and Treatment: Consent by patient was obtained for treatment procedures. The patient understood the discussion of treatment and procedures well. All questions were answered thoroughly reviewed. Debridement of mycotic and hypertrophic toenails, 1 through 5 bilateral and clearing of subungual debris. No ulceration, no infection noted.  Return Visit-Office Procedure: Patient instructed to return to the office for a follow up visit 3 months for continued evaluation and treatment.  Boneta Lucks, DPM    No follow-ups on file.

## 2019-11-06 ENCOUNTER — Encounter: Payer: Self-pay | Admitting: Psychiatry

## 2019-11-06 ENCOUNTER — Telehealth (INDEPENDENT_AMBULATORY_CARE_PROVIDER_SITE_OTHER): Payer: Medicare Other | Admitting: Psychiatry

## 2019-11-06 DIAGNOSIS — F411 Generalized anxiety disorder: Secondary | ICD-10-CM | POA: Diagnosis not present

## 2019-11-06 DIAGNOSIS — F5101 Primary insomnia: Secondary | ICD-10-CM

## 2019-11-06 DIAGNOSIS — F319 Bipolar disorder, unspecified: Secondary | ICD-10-CM

## 2019-11-06 MED ORDER — DIVALPROEX SODIUM 250 MG PO DR TAB: 500.0000 mg | DELAYED_RELEASE_TABLET | Freq: Every day | ORAL | 0 refills | Status: DC
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
## 2019-11-17 ENCOUNTER — Other Ambulatory Visit: Payer: Self-pay | Admitting: Endocrinology

## 2019-11-25 ENCOUNTER — Emergency Department (HOSPITAL_COMMUNITY): Payer: Medicare Other

## 2019-11-25 ENCOUNTER — Emergency Department (HOSPITAL_COMMUNITY)
Admission: EM | Admit: 2019-11-25 | Discharge: 2019-11-25 | Disposition: A | Discharge status: Home / Self Care | Payer: Medicare Other | Attending: Emergency Medicine | Admitting: Emergency Medicine

## 2019-11-25 ENCOUNTER — Other Ambulatory Visit: Payer: Self-pay

## 2019-11-25 DIAGNOSIS — M549 Dorsalgia, unspecified: Secondary | ICD-10-CM | POA: Insufficient documentation

## 2019-11-25 DIAGNOSIS — Z96652 Presence of left artificial knee joint: Secondary | ICD-10-CM | POA: Insufficient documentation

## 2019-11-25 DIAGNOSIS — K5732 Diverticulitis of large intestine without perforation or abscess without bleeding: Secondary | ICD-10-CM | POA: Diagnosis not present

## 2019-11-25 DIAGNOSIS — R519 Headache, unspecified: Secondary | ICD-10-CM | POA: Diagnosis not present

## 2019-11-25 DIAGNOSIS — E039 Hypothyroidism, unspecified: Secondary | ICD-10-CM | POA: Diagnosis not present

## 2019-11-25 DIAGNOSIS — K5792 Diverticulitis of intestine, part unspecified, without perforation or abscess without bleeding: Secondary | ICD-10-CM | POA: Diagnosis not present

## 2019-11-25 DIAGNOSIS — R1030 Lower abdominal pain, unspecified: Secondary | ICD-10-CM | POA: Diagnosis not present

## 2019-11-25 DIAGNOSIS — I10 Essential (primary) hypertension: Secondary | ICD-10-CM | POA: Diagnosis not present

## 2019-11-25 DIAGNOSIS — Z79890 Hormone replacement therapy (postmenopausal): Secondary | ICD-10-CM | POA: Insufficient documentation

## 2019-11-25 DIAGNOSIS — R109 Unspecified abdominal pain: Secondary | ICD-10-CM | POA: Diagnosis not present

## 2019-11-25 DIAGNOSIS — Z96641 Presence of right artificial hip joint: Secondary | ICD-10-CM | POA: Diagnosis not present

## 2019-11-25 DIAGNOSIS — E1140 Type 2 diabetes mellitus with diabetic neuropathy, unspecified: Secondary | ICD-10-CM | POA: Insufficient documentation

## 2019-11-25 DIAGNOSIS — Z794 Long term (current) use of insulin: Secondary | ICD-10-CM | POA: Diagnosis not present

## 2019-11-25 DIAGNOSIS — Z79899 Other long term (current) drug therapy: Secondary | ICD-10-CM | POA: Insufficient documentation

## 2019-11-25 LAB — CBC
HCT: 45.8 % (ref 36.0–46.0)
Hemoglobin: 14.4 g/dL (ref 12.0–15.0)
MCH: 29.5 pg (ref 26.0–34.0)
MCHC: 31.4 g/dL (ref 30.0–36.0)
MCV: 93.9 fL (ref 80.0–100.0)
Platelets: 185 10*3/uL (ref 150–400)
RBC: 4.88 MIL/uL (ref 3.87–5.11)
RDW: 13.5 % (ref 11.5–15.5)
WBC: 7.4 10*3/uL (ref 4.0–10.5)
nRBC: 0 % (ref 0.0–0.2)

## 2019-11-25 LAB — COMPREHENSIVE METABOLIC PANEL
ALT: 19 U/L (ref 0–44)
AST: 17 U/L (ref 15–41)
Albumin: 4.1 g/dL (ref 3.5–5.0)
Alkaline Phosphatase: 84 U/L (ref 38–126)
Anion gap: 12 (ref 5–15)
BUN: 10 mg/dL (ref 8–23)
CO2: 25 mmol/L (ref 22–32)
Calcium: 10.4 mg/dL — ABNORMAL HIGH (ref 8.9–10.3)
Chloride: 104 mmol/L (ref 98–111)
Creatinine, Ser: 0.76 mg/dL (ref 0.44–1.00)
GFR calc Af Amer: >60 mL/min (ref >60)
GFR calc non Af Amer: >60 mL/min (ref >60)
Glucose, Bld: 124 mg/dL — ABNORMAL HIGH (ref 70–99)
Potassium: 4.1 mmol/L (ref 3.5–5.1)
Sodium: 141 mmol/L (ref 135–145)
Total Bilirubin: 0.6 mg/dL (ref 0.3–1.2)
Total Protein: 7.3 g/dL (ref 6.5–8.1)

## 2019-11-25 LAB — URINALYSIS, ROUTINE W REFLEX MICROSCOPIC
Bilirubin Urine: NEGATIVE
Glucose, UA: NEGATIVE mg/dL
Hgb urine dipstick: NEGATIVE
Ketones, ur: NEGATIVE mg/dL
Leukocytes,Ua: NEGATIVE
Nitrite: NEGATIVE
Protein, ur: NEGATIVE mg/dL
Specific Gravity, Urine: 1.032 — ABNORMAL HIGH (ref 1.005–1.030)
pH: 7 (ref 5.0–8.0)

## 2019-11-25 LAB — LACTIC ACID, PLASMA
Lactic Acid, Venous: 2.1 mmol/L (ref 0.5–1.9)
Lactic Acid, Venous: 1.6 mmol/L (ref 0.5–1.9)

## 2019-11-25 LAB — LIPASE, BLOOD: Lipase: 36 U/L (ref 11–51)

## 2019-11-25 MED ORDER — CIPROFLOXACIN HCL 500 MG PO TABS: 500.0000 mg | ORAL_TABLET | Freq: Two times a day (BID) | ORAL | 0 refills | Status: AC

## 2019-11-25 MED ORDER — MORPHINE SULFATE (PF) 4 MG/ML IV SOLN
4.0000 mg | Freq: Once | INTRAVENOUS | Status: AC
  Administered 2019-11-25: 4 mg via INTRAVENOUS
  Filled 2019-11-25: qty 1

## 2019-11-25 MED ORDER — IOHEXOL 300 MG/ML  SOLN
100.0000 mL | Freq: Once | INTRAMUSCULAR | Status: AC | PRN
  Administered 2019-11-25: 100 mL via INTRAVENOUS

## 2019-11-25 MED ORDER — ONDANSETRON 4 MG PO TBDP: 4.0000 mg | ORAL_TABLET | Freq: Three times a day (TID) | ORAL | 0 refills | Status: DC | PRN

## 2019-11-25 MED ORDER — ONDANSETRON HCL 4 MG/2ML IJ SOLN
4.0000 mg | Freq: Once | INTRAMUSCULAR | Status: AC
  Administered 2019-11-25: 4 mg via INTRAVENOUS
  Filled 2019-11-25: qty 2

## 2019-11-25 MED ORDER — METRONIDAZOLE 500 MG PO TABS: 500.0000 mg | ORAL_TABLET | Freq: Three times a day (TID) | ORAL | 0 refills | Status: AC

## 2019-11-25 MED ORDER — OXYCODONE-ACETAMINOPHEN 5-325 MG PO TABS: 1.0000 | ORAL_TABLET | ORAL | 0 refills | Status: DC | PRN

## 2019-11-25 NOTE — ED Provider Notes (Signed)
[facility] DEPT Provider Note   CSN: [id number] Arrival date & time: 11/25/19  8841     History Chief Complaint  Patient presents with  . Abdominal Pain    [name] [name] is a 74 y.o. female.  The history is provided by the patient and medical records. No language interpreter was used.  Abdominal Pain Pain location:  RLQ, LLQ and suprapubic Pain quality: aching and cramping   Pain radiates to:  Back Pain severity:  Severe Onset quality:  Gradual Duration:  3 days Timing:  Constant Progression:  Waxing and waning Chronicity:  New Relieved by:  Nothing Worsened by:  Nothing Ineffective treatments:  None tried Associated symptoms: nausea   Associated symptoms: no chest pain, no chills, no constipation, no cough, no diarrhea, no dysuria, no fatigue, no fever, no shortness of breath, no vaginal bleeding, no vaginal discharge and no vomiting   Risk factors: obesity        Past Medical History:  Diagnosis Date  . Alcohol abuse   . Anxiety    takes Valium daily as needed and Ativan daily  . Bilateral hearing loss   . Bipolar 1 disorder ([place])    takes Lithium nightly and Synthroid daily  . Chronic back pain    DDD; "all over" (09/14/2017)  . Colitis, ischemic ([place]) 2012  . Confusion    r/t meds  . Depression    takes Prozac daily and Bupspirone   . Diverticulosis   . Dyslipidemia    takes Crestor daily  . Fibromyalgia   . Headache    "weekly" (09/14/2017)  . Hepatic steatosis 06/18/12   severe  . Hyperlipidemia   . Hypertension   . Hypothyroidism   . IBS (irritable bowel syndrome)   . Ischemic colitis ([place])   . Joint pain   . Joint swelling   . Lupus erythematosus tumidus    tumid-skin  . Migraine    "1-2/yr; maybe" (09/14/2017)  . Numbness    in right foot  . Osteoarthritis    "all over" (09/14/2017)  . Osteoarthritis cervical spine   . Osteoarthritis of hand    bilateral  . Pneumonia    "walking pneumonia several times;  long time since the last time" (09/14/2017)  . Restless leg syndrome    takes Requip nightly  . Sciatica   . Type II diabetes mellitus ([place])   . Urinary frequency   . Urinary leakage   . Urinary urgency   . Urinary, incontinence, stress female   . Walking pneumonia    last time more than 66yrs ago    Patient Active Problem List   Diagnosis Date Noted  . Overweight (BMI 25.0-29.9) 02/08/2019  . S/P left TKA 02/07/2019  . Chest pain   . Chest pain with normal coronary angiography   . Diverticulitis 11/19/2016  . Hypothyroidism 07/14/2016  . Diarrhea   . Generalized abdominal pain   . Major depressive disorder, recurrent severe without psychotic features ([place]) 12/31/2015  . Nausea without vomiting   . Colitis 10/28/2015  . Unintentional poisoning by psychotropic drug 07/05/2015  . Restless leg syndrome 07/05/2015  . Diabetic polyneuropathy associated with type 2 diabetes mellitus ([place]) 09/21/2014  . Arthritis of left hip 06/09/2013  . Arthritis of right hip 06/08/2013  . Hip pain, right 05/02/2013  . IBS (irritable bowel syndrome) 01/02/2013  . Unspecified vitamin D deficiency 07/05/2012  . Pure hyperglyceridemia 07/04/2012  . Diabetes mellitus with diabetic neuropathy, without long-term current use of  insulin ([place]) 06/18/2012  . Hypertension 06/18/2012  . Bipolar 1 disorder ([place]) 06/18/2012  . Low back pain 02/25/2011  . Acute ischemic colitis ([place]) 01/06/2011  . Sciatica 12/19/2010  . Fibromyalgia 10/28/2010  . Primary osteoarthritis of right hip 10/24/2010  . Hyperlipidemia with target low density lipoprotein (LDL) cholesterol less than 100 mg/dL 05/05/2010  . DEPRESSION/ANXIETY 05/05/2010  . ABUSE, ALCOHOL, IN REMISSION 05/05/2010  . OTITIS MEDIA, CHRONIC 05/05/2010  . Hearing loss 05/05/2010  . ALLERGIC RHINITIS, SEASONAL, MILD 05/05/2010  . URINARY INCONTINENCE, STRESS, FEMALE 05/05/2010  . OSTEOARTHRITIS, HANDS, BILATERAL 05/05/2010  . OSTEOARTHRITIS, CERVICAL  SPINE 05/05/2010    Past Surgical History:  Procedure Laterality Date  . ABDOMINAL HYSTERECTOMY     "they left my ovaries"  . APPENDECTOMY    . CARDIAC CATHETERIZATION  09/14/2017  . COLONOSCOPY    . DENTAL SURGERY Left 10/2016   dental implant  . DILATION AND CURETTAGE OF UTERUS  X 4  . ESOPHAGOGASTRODUODENOSCOPY    . FLEXIBLE SIGMOIDOSCOPY N/A 06/21/2012   Procedure: FLEXIBLE SIGMOIDOSCOPY;  Surgeon: Jerene Bears, MD;  Location: [facility];  Service: Gastroenterology;  Laterality: N/A;  . JOINT REPLACEMENT    . LEFT HEART CATH AND CORONARY ANGIOGRAPHY N/A 09/14/2017   Procedure: LEFT HEART CATH AND CORONARY ANGIOGRAPHY;  Surgeon: Belva Crome, MD;  Location: [facility];  Service: Cardiovascular;  Laterality: N/A;  . SHOULDER ARTHROSCOPY Right    "shaved spurs off rotator cuff"  . TONSILLECTOMY    . TOTAL HIP ARTHROPLASTY Right 06/09/2013   Procedure: TOTAL HIP ARTHROPLASTY;  Surgeon: Kerin Salen, MD;  Location: [facility];  Service: Orthopedics;  Laterality: Right;  . TOTAL KNEE ARTHROPLASTY Left 02/07/2019   Procedure: TOTAL KNEE ARTHROPLASTY;  Surgeon: Paralee Cancel, MD;  Location: [facility];  Service: Orthopedics;  Laterality: Left;  70 mins  . TUBAL LIGATION    . TUMOR EXCISION Right 1968   angle of jaw; benign     OB History   No obstetric history on file.     Family History  Problem Relation Age of Onset  . Drug abuse Mother   . Alcohol abuse Mother   . Alcohol abuse Father   . Hypertension Father   . CAD Brother   . Hypertension Brother   . Alcohol abuse Brother   . Hypertension Brother   . Hypertension Brother   . Psoriasis Daughter   . Arthritis Daughter        psoriatic arthritis   . Alcohol abuse Grandchild     Social History   Tobacco Use  . Smoking status: Never Smoker  . Smokeless tobacco: Never Used  Vaping Use  . Vaping Use: Never used  Substance Use Topics  . Alcohol use: Not Currently    Comment: 09/14/2017 "nothing since 04/15/2008"  .  Drug use: Not Currently    Frequency: 7.0 times per week    Types: Benzodiazepines    Home Medications Prior to Admission medications   Medication Sig Start Date End Date Taking? Authorizing Provider  acetaminophen (TYLENOL) 500 MG tablet Take 1,000 mg by mouth 3 (three) times daily.    [provider]  ALPRAZolam Duanne Moron) 0.5 MG tablet Take 1 tablet (0.5 mg total) by mouth 4 (four) times daily as needed for anxiety. 08/03/19   [name], PMHNP  amoxicillin (AMOXIL) 500 MG capsule amoxicillin 500 mg capsule  take 4 capsules by mouth 1 HOUR PRIOR TO DENTAL APPOINTMENT    [provider]  atorvastatin (LIPITOR) 40 MG tablet Take 40 mg by mouth at bedtime.     [provider]  baclofen (LIORESAL) 10 MG tablet Take 10 mg by mouth 2 (two) times daily as needed for muscle spasms.    [provider]  BD PEN NEEDLE NANO 2ND GEN 32G X 4 MM MISC USE TO INJECT 5 TIMES DAILY AS DIRECTED 10/17/19   Elayne Snare, MD  celecoxib (CELEBREX) 200 MG capsule Take 200 mg by mouth at bedtime.    [provider]  Cholecalciferol (VITAMIN D) 50 MCG (2000 UT) tablet Take 2,000 Units by mouth daily.    [provider]  Cyanocobalamin (VITAMIN B 12 PO) Take by mouth.    [provider]  dicyclomine (BENTYL) 10 MG capsule Take 2 capsules (20 mg total) by mouth every 8 (eight) hours as needed for spasms. 04/17/19   Noralyn Pick, NP  divalproex (DEPAKOTE) 250 MG DR tablet Take 2 tablets (500 mg total) by mouth at bedtime. 11/06/19   [name], PMHNP  donepezil (ARICEPT) 10 MG tablet Take 1 tablet (10 mg total) by mouth at bedtime. 10/09/19   [name], PMHNP  furosemide (LASIX) 20 MG tablet Take 1 tablet (20 mg total) by mouth daily for 10 days. 07/09/19 07/19/19  Wyvonnia Dusky, MD  gabapentin (NEURONTIN) 600 MG tablet Take 600 mg by mouth 3 (three) times daily. 05/12/19   [provider]  glucose blood (CONTOUR NEXT TEST) test  strip Use to test blood sugar 2 times daily 04/28/18   Elayne Snare, MD  HUMALOG KWIKPEN 100 UNIT/ML KwikPen INJECT 10 TO 14 UNITS THREE TIMES DAILY FOR BLOOD SUGARS OVER 180 05/22/19   Elayne Snare, MD  Insulin Glargine (LANTUS SOLOSTAR) 100 UNIT/ML Solostar Pen Inject 54 units under the skin once daily. 05/05/19   Elayne Snare, MD  insulin lispro (HUMALOG KWIKPEN) 100 UNIT/ML KwikPen Inject into the skin 3 (three) times daily. Inject 6 units under the skin at breakfast and lunch and 8 units at lunch as needed.    [provider]  levothyroxine (SYNTHROID) 75 MCG tablet TAKE 1 TABLET BY MOUTH DAILY BEFORE BREAKFAST 11/20/19   Elayne Snare, MD  lithium carbonate (ESKALITH) 450 MG CR tablet Take 1 tablet (450 mg total) by mouth at bedtime. 10/09/19   [name], PMHNP  loperamide (IMODIUM A-D) 2 MG tablet Take 2 mg by mouth 2 (two) times daily as needed for diarrhea or loose stools.    [provider]  losartan (COZAAR) 50 MG tablet Take 25 mg by mouth daily.     [provider]  Melatonin 10 MG TBCR Take 10 mg by mouth at bedtime.    [provider]  memantine (NAMENDA) 10 MG tablet TAKE 1 TABLET(10 MG) BY MOUTH EVERY EVENING 10/09/19   [name], PMHNP  methocarbamol (ROBAXIN) 500 MG tablet Take 1 tablet (500 mg total) by mouth every 6 (six) hours as needed for muscle spasms. 02/08/19   Danae Orleans, PA-C  Microlet Lancets MISC USE DAILY AS DIRECTED 10/24/18   Elayne Snare, MD  nitroGLYCERIN (NITROSTAT) 0.4 MG SL tablet Place 1 tablet (0.4 mg total) under the tongue every 5 (five) minutes as needed for chest pain. 09/14/17   Lyda Jester M, PA-C  ondansetron (ZOFRAN ODT) 4 MG disintegrating tablet 4mg  ODT q4 hours prn nausea/vomit Patient taking differently: Take 4 mg by mouth every 6 (six) hours as needed for nausea or vomiting.  05/19/18   Milton Ferguson, MD  potassium chloride (MICRO-K) 10 MEQ CR capsule Take 1 capsule (10 mEq total) by mouth daily for 10  days. 07/09/19 07/19/19  Wyvonnia Dusky, MD  QUEtiapine (SEROQUEL) 200 MG tablet TAKE 1 TAB PO q 5 pm and 1 tab po QHS 10/09/19   [name], PMHNP  rOPINIRole (REQUIP) 0.5 MG tablet Takes 4 tabs at 5 pm and 3 tabs at bedtime and 1-2 tabs prn 10/09/19   [name], PMHNP  Semaglutide, 1 MG/DOSE, (OZEMPIC, 1 MG/DOSE,) 2 MG/1.5ML SOPN Inject 0.75 mLs (1 mg total) into the skin once a week. 08/30/19   Elayne Snare, MD    Allergies    Codeine, Macrolides and ketolides, Procaine hcl, Aspirin, Benadryl [diphenhydramine], Diflucan [fluconazole], Dilaudid [hydromorphone hcl], and Sulfa antibiotics  Review of Systems   Review of Systems  Constitutional: Negative for chills, diaphoresis, fatigue and fever.  HENT: Negative for congestion.   Eyes: Negative for visual disturbance.  Respiratory: Negative for cough, chest tightness, shortness of breath and wheezing.   Cardiovascular: Negative for chest pain, palpitations and leg swelling.  Gastrointestinal: Positive for abdominal pain and nausea. Negative for abdominal distention, constipation, diarrhea and vomiting.  Genitourinary: Negative for dysuria, flank pain, frequency, vaginal bleeding and vaginal discharge.  Musculoskeletal: Positive for back pain. Negative for neck pain and neck stiffness.  Skin: Negative for rash and wound.  Neurological: Positive for headaches. Negative for weakness, light-headedness and numbness.  Psychiatric/Behavioral: Negative for agitation and confusion.  All other systems reviewed and are negative.   Physical Exam Updated Vital Signs BP (!) 144/68 (BP Location: Left Arm)   Pulse 93   Temp 97.9 F (36.6 C) (Oral)   Resp 18   Ht 5\' 2"  (1.575 m)   Wt 79.4 kg   SpO2 97%   BMI 32.01 kg/m   Physical Exam Vitals and nursing note reviewed.  Constitutional:      General: She is not in acute distress.    Appearance: She is well-developed. She is not ill-appearing, toxic-appearing or diaphoretic.  HENT:      Head: Normocephalic and atraumatic.  Eyes:     Conjunctiva/sclera: Conjunctivae normal.  Cardiovascular:     Rate and Rhythm: Normal rate and regular rhythm.     Heart sounds: Normal heart sounds. No murmur heard.   Pulmonary:     Effort: Pulmonary effort is normal. No respiratory distress.     Breath sounds: Normal breath sounds. No wheezing, rhonchi or rales.  Chest:     Chest wall: No tenderness.  Abdominal:     General: Abdomen is flat. Bowel sounds are normal. There is no distension.     Palpations: Abdomen is soft.     Tenderness: There is abdominal tenderness in the right lower quadrant, suprapubic area and left lower quadrant. There is no right CVA tenderness, left CVA tenderness, guarding or rebound.  Musculoskeletal:     Cervical back: Neck supple.  Skin:    General: Skin is warm and dry.     Capillary Refill: Capillary refill takes less than 2 seconds.  Neurological:     General: No focal deficit present.     Mental Status: She is alert.  Psychiatric:        Mood and Affect: Mood normal.     ED Results / Procedures / Treatments   Labs (all labs ordered are listed, but only abnormal results are displayed) Labs Reviewed  COMPREHENSIVE METABOLIC PANEL - Abnormal; Notable for the following components:      Result  Value   Glucose, Bld 124 (*)    Calcium 10.4 (*)    All other components within normal limits  URINALYSIS, ROUTINE W REFLEX MICROSCOPIC - Abnormal; Notable for the following components:   Color, Urine STRAW (*)    Specific Gravity, Urine 1.032 (*)    All other components within normal limits  LACTIC ACID, PLASMA - Abnormal; Notable for the following components:   Lactic Acid, Venous 2.1 (*)    All other components within normal limits  CULTURE, BLOOD (ROUTINE X 2)  CULTURE, BLOOD (ROUTINE X 2)  URINE CULTURE  LIPASE, BLOOD  CBC  LACTIC ACID, PLASMA    EKG None  Radiology CT ABDOMEN PELVIS W CONTRAST  Result Date: 11/25/2019 CLINICAL DATA:   74 year old female with abdominal pain concerning for diverticulitis EXAM: CT ABDOMEN AND PELVIS WITH CONTRAST TECHNIQUE: Multidetector CT imaging of the abdomen and pelvis was performed using the standard protocol following bolus administration of intravenous contrast. CONTRAST:  160mL OMNIPAQUE IOHEXOL 300 MG/ML  SOLN COMPARISON:  01/26/2019 FINDINGS: Lower chest: Chronic changes at the bilateral lung bases. Unchanged benign-appearing 5 mm nodule at the left lung base. Hepatobiliary: Unremarkable liver.  Unremarkable gallbladder. Pancreas: Unremarkable Spleen: Unremarkable Adrenals/Urinary Tract: - Right adrenal gland:  Unremarkable - Left adrenal gland: Unremarkable. - Right kidney: Pelvicaliectasis of the right kidney again demonstrated. Transition to a decompressed collecting system at the UPJ. No nephrolithiasis. Unremarkable course of the ureter. No focal lesion. - Left Kidney: No hydronephrosis, nephrolithiasis, inflammation, or ureteral dilation. No focal lesion. - Urinary Bladder: Unremarkable. Stomach/Bowel: - Stomach: Unremarkable. - Small bowel: Unremarkable - Appendix: Appendix is not visualized, however, no inflammatory changes are present adjacent to the cecum to indicate an appendicitis. - Colon: Diverticular are present throughout the left colon/sigmoid colon. Subtle circumferential thickening of the sigmoid colon and distal descending colon. Inspissated secretions/fecalith within a few diverticula of the left colon. Subtle inflammatory changes without local abscess. No evidence of perforation. Vascular/Lymphatic: Mild atherosclerosis of the abdominal aorta. No aneurysm. Bilateral iliac arteries are patent. Proximal femoral arteries are patent. Reproductive: Hysterectomy Other: Fat containing umbilical hernia. Musculoskeletal: Surgical changes of right hip arthroplasty. Degenerative changes of the thoracolumbar spine without bony canal narrowing. Nonspecific pelvic floor laxity. IMPRESSION: Mild  inflammatory changes of the left colon associated with regions of diverticula, perhaps indicating early diverticular disease. No evidence of perforation or local abscess. Redemonstration of right-sided UPJ stenosis with associated pelvicaliectasis. Referral for urologic evaluation should be considered, if not already performed. Aortic Atherosclerosis (ICD10-I70.0). Additional ancillary findings as above. Electronically Signed   By: Corrie Mckusick D.O.   On: 11/25/2019 11:41    Procedures Procedures (including critical care time)  Medications Ordered in ED Medications  morphine 4 MG/ML injection 4 mg (4 mg Intravenous Given 11/25/19 1018)  ondansetron (ZOFRAN) injection 4 mg (4 mg Intravenous Given 11/25/19 1017)  iohexol (OMNIPAQUE) 300 MG/ML solution 100 mL (100 mLs Intravenous Contrast Given 11/25/19 1058)  morphine 4 MG/ML injection 4 mg (4 mg Intravenous Given 11/25/19 1400)    ED Course  I have reviewed the triage vital signs and the nursing notes.  Pertinent labs & imaging results that were available during my care of the patient were reviewed by me and considered in my medical decision making (see chart for details).    MDM Rules/Calculators/A&P                          Carlisle Torgeson is a 74 y.o.  female with a past medical history significant for prior ischemic colitis, prior diverticulitis, diabetes, irritable bowel syndrome, hypertension, hyperlipidemia, fibromyalgia, hypothyroidism, and hypertriglyceridemia who presents with abdominal pain.  Patient reports that for the last few days she has been having pain in her lower abdomen that is wrapping around towards her back.  She reports it feels "the exact same" as her last diverticulitis.  She reports no history of trauma.  She reports some nausea no vomiting.  She reports no constipation or diarrhea.  She did report her bowel movement was slightly looser than normal but it was nonbloody.  She denies urinary symptoms.  She reports the pain  is 8 out of 10 in severity.  She denies any respiratory symptoms including no chest pain, shortness of breath, or cough.  Does report some very mild congestion and some headache.  She thinks this is related to her abdominal pain.  She wanted to make sure there was no abscess perforation or other complication to which she feels likely diverticulitis.  She denies a history of aortic disease and CT scan last year did not show evidence of aortic abnormalities.  Her blood pressure was only minimally elevated, do not suspect a aortic etiology of her symptoms given her similar to prior diverticular pain.  On exam, lungs are clear and chest is nontender.  Abdomen is tender to palpation in the lower abdomen.  Bowel sounds were appreciated.  Patient had no flank tenderness.  No CVA tenderness.  No rash appreciated.  Exam otherwise unremarkable.  Given her similar to prior, will get a CT scan to look for complicated diverticulitis.  We will also get screening labs, give her pain medicine, and nausea medicine.  Anticipate reassessment after work-up.  Imaging shows evidence of uncomplicated diverticulitis with no perforation or abscess.  Patient is feeling better.  Patient would like to go home.  Patient prescription for antibiotics and nausea medicine and pain medicine.  Patient agreed with plan of care and follow-up.  She had no questions or concerns and was discharged in good condition.   Final Clinical Impression(s) / ED Diagnoses Final diagnoses:  Lower abdominal pain  Diverticulitis    Rx / DC Orders ED Discharge Orders         Ordered    ciprofloxacin (CIPRO) 500 MG tablet  Every 12 hours        11/25/19 1457    metroNIDAZOLE (FLAGYL) 500 MG tablet  3 times daily        11/25/19 1457    oxyCODONE-acetaminophen (PERCOCET/ROXICET) 5-325 MG tablet  Every 4 hours PRN        11/25/19 1457    ondansetron (ZOFRAN-ODT) 4 MG disintegrating tablet  Every 8 hours PRN        11/25/19 1457           Clinical Impression: 1. Lower abdominal pain   2. Diverticulitis     Disposition: Discharge  Condition: Good  I have discussed the results, Dx and Tx plan with the pt(& family if present). He/she/they expressed understanding and agree(s) with the plan. Discharge instructions discussed at great length. Strict return precautions discussed and pt &/or family have verbalized understanding of the instructions. No further questions at time of discharge.    New Prescriptions   CIPROFLOXACIN (CIPRO) 500 MG TABLET    Take 1 tablet (500 mg total) by mouth every 12 (twelve) hours for 10 days.   METRONIDAZOLE (FLAGYL) 500 MG TABLET    Take 1 tablet (500  mg total) by mouth 3 (three) times daily for 10 days.   ONDANSETRON (ZOFRAN-ODT) 4 MG DISINTEGRATING TABLET    Take 1 tablet (4 mg total) by mouth every 8 (eight) hours as needed for nausea or vomiting.   OXYCODONE-ACETAMINOPHEN (PERCOCET/ROXICET) 5-325 MG TABLET    Take 1 tablet by mouth every 4 (four) hours as needed for severe pain.    Follow Up: London Pepper, MD [address] [place] [address] [facility] [address] [id number] mc [facility] [place]         Moss Berry, Gwenyth Allegra, MD 11/25/19 [id number]

## 2019-11-25 NOTE — Discharge Instructions (Signed)
Your work-up today was consistent with diverticulitis with no evidence of perforation or abscess. There is no obstruction. Your labs were reassuring and overall similar to prior. Please take the antibiotics as well as the medications to help with your discomfort and nausea. Please follow-up with your primary doctor. Please stay hydrated. If any symptoms change or worsen, please return to the nearest emergency department.

## 2019-11-25 NOTE — ED Notes (Signed)
Date and time results received: 11/25/19 11:10 AM  Test: Lactic Acid Critical Value: 2.1  Name of Provider Notified: [place]

## 2019-11-25 NOTE — ED Triage Notes (Signed)
Patient reports lower abd pain x 2 days. Radiating to back. Crampy pain. Rated 8/10

## 2019-11-26 LAB — URINE CULTURE

## 2019-11-28 DIAGNOSIS — N132 Hydronephrosis with renal and ureteral calculous obstruction: Secondary | ICD-10-CM | POA: Diagnosis not present

## 2019-11-28 DIAGNOSIS — K5792 Diverticulitis of intestine, part unspecified, without perforation or abscess without bleeding: Secondary | ICD-10-CM | POA: Diagnosis not present

## 2019-11-28 DIAGNOSIS — R109 Unspecified abdominal pain: Secondary | ICD-10-CM | POA: Diagnosis not present

## 2019-11-28 DIAGNOSIS — R110 Nausea: Secondary | ICD-10-CM | POA: Diagnosis not present

## 2019-11-29 ENCOUNTER — Other Ambulatory Visit: Payer: Self-pay

## 2019-11-29 ENCOUNTER — Encounter (HOSPITAL_COMMUNITY): Payer: Self-pay

## 2019-11-29 ENCOUNTER — Emergency Department (HOSPITAL_COMMUNITY)
Admission: EM | Admit: 2019-11-29 | Discharge: 2019-11-29 | Disposition: A | Discharge status: Home / Self Care | Payer: Medicare Other | Attending: Emergency Medicine | Admitting: Emergency Medicine

## 2019-11-29 ENCOUNTER — Emergency Department (HOSPITAL_COMMUNITY): Payer: Medicare Other

## 2019-11-29 DIAGNOSIS — Z955 Presence of coronary angioplasty implant and graft: Secondary | ICD-10-CM | POA: Insufficient documentation

## 2019-11-29 DIAGNOSIS — R197 Diarrhea, unspecified: Secondary | ICD-10-CM | POA: Insufficient documentation

## 2019-11-29 DIAGNOSIS — R10814 Left lower quadrant abdominal tenderness: Secondary | ICD-10-CM | POA: Insufficient documentation

## 2019-11-29 DIAGNOSIS — Z96652 Presence of left artificial knee joint: Secondary | ICD-10-CM | POA: Insufficient documentation

## 2019-11-29 DIAGNOSIS — Z794 Long term (current) use of insulin: Secondary | ICD-10-CM | POA: Insufficient documentation

## 2019-11-29 DIAGNOSIS — Z79899 Other long term (current) drug therapy: Secondary | ICD-10-CM | POA: Diagnosis not present

## 2019-11-29 DIAGNOSIS — Z20822 Contact with and (suspected) exposure to covid-19: Secondary | ICD-10-CM | POA: Diagnosis not present

## 2019-11-29 DIAGNOSIS — Z96641 Presence of right artificial hip joint: Secondary | ICD-10-CM | POA: Insufficient documentation

## 2019-11-29 DIAGNOSIS — Z79890 Hormone replacement therapy (postmenopausal): Secondary | ICD-10-CM | POA: Diagnosis not present

## 2019-11-29 DIAGNOSIS — K5732 Diverticulitis of large intestine without perforation or abscess without bleeding: Secondary | ICD-10-CM | POA: Diagnosis present

## 2019-11-29 DIAGNOSIS — E1142 Type 2 diabetes mellitus with diabetic polyneuropathy: Secondary | ICD-10-CM | POA: Diagnosis not present

## 2019-11-29 DIAGNOSIS — I10 Essential (primary) hypertension: Secondary | ICD-10-CM | POA: Diagnosis not present

## 2019-11-29 DIAGNOSIS — R109 Unspecified abdominal pain: Secondary | ICD-10-CM | POA: Diagnosis not present

## 2019-11-29 DIAGNOSIS — E039 Hypothyroidism, unspecified: Secondary | ICD-10-CM | POA: Diagnosis not present

## 2019-11-29 LAB — CBC WITH DIFFERENTIAL/PLATELET
Abs Immature Granulocytes: 0.08 10*3/uL — ABNORMAL HIGH (ref 0.00–0.07)
Basophils Absolute: 0 10*3/uL (ref 0.0–0.1)
Basophils Relative: 1 %
Eosinophils Absolute: 0.3 10*3/uL (ref 0.0–0.5)
Eosinophils Relative: 4 %
HCT: 45.2 % (ref 36.0–46.0)
Hemoglobin: 14.1 g/dL (ref 12.0–15.0)
Immature Granulocytes: 1 %
Lymphocytes Relative: 23 %
Lymphs Abs: 1.8 10*3/uL (ref 0.7–4.0)
MCH: 29.5 pg (ref 26.0–34.0)
MCHC: 31.2 g/dL (ref 30.0–36.0)
MCV: 94.6 fL (ref 80.0–100.0)
Monocytes Absolute: 0.5 10*3/uL (ref 0.1–1.0)
Monocytes Relative: 7 %
Neutro Abs: 5 10*3/uL (ref 1.7–7.7)
Neutrophils Relative %: 64 %
Platelets: 193 10*3/uL (ref 150–400)
RBC: 4.78 MIL/uL (ref 3.87–5.11)
RDW: 13.4 % (ref 11.5–15.5)
WBC: 7.7 10*3/uL (ref 4.0–10.5)
nRBC: 0 % (ref 0.0–0.2)

## 2019-11-29 LAB — COMPREHENSIVE METABOLIC PANEL
ALT: 18 U/L (ref 0–44)
AST: 19 U/L (ref 15–41)
Albumin: 3.8 g/dL (ref 3.5–5.0)
Alkaline Phosphatase: 71 U/L (ref 38–126)
Anion gap: 12 (ref 5–15)
BUN: 7 mg/dL — ABNORMAL LOW (ref 8–23)
CO2: 23 mmol/L (ref 22–32)
Calcium: 9.7 mg/dL (ref 8.9–10.3)
Chloride: 105 mmol/L (ref 98–111)
Creatinine, Ser: 0.92 mg/dL (ref 0.44–1.00)
GFR calc Af Amer: >60 mL/min (ref >60)
GFR calc non Af Amer: >60 mL/min (ref >60)
Glucose, Bld: 126 mg/dL — ABNORMAL HIGH (ref 70–99)
Potassium: 4.5 mmol/L (ref 3.5–5.1)
Sodium: 140 mmol/L (ref 135–145)
Total Bilirubin: 0.5 mg/dL (ref 0.3–1.2)
Total Protein: 6.8 g/dL (ref 6.5–8.1)

## 2019-11-29 LAB — SARS CORONAVIRUS 2 BY RT PCR (HOSPITAL ORDER, PERFORMED IN ~~LOC~~ HOSPITAL LAB): SARS Coronavirus 2: NEGATIVE

## 2019-11-29 LAB — LACTIC ACID, PLASMA
Lactic Acid, Venous: 2.1 mmol/L (ref 0.5–1.9)
Lactic Acid, Venous: 1.6 mmol/L (ref 0.5–1.9)

## 2019-11-29 LAB — LIPASE, BLOOD: Lipase: 27 U/L (ref 11–51)

## 2019-11-29 MED ORDER — IOHEXOL 300 MG/ML  SOLN
100.0000 mL | Freq: Once | INTRAMUSCULAR | Status: AC | PRN
  Administered 2019-11-29: 100 mL via INTRAVENOUS

## 2019-11-29 MED ORDER — MORPHINE SULFATE (PF) 4 MG/ML IV SOLN
4.0000 mg | Freq: Once | INTRAVENOUS | Status: AC
  Administered 2019-11-29: 4 mg via INTRAVENOUS
  Filled 2019-11-29: qty 1

## 2019-11-29 MED ORDER — SODIUM CHLORIDE 0.9 % IV BOLUS
1000.0000 mL | Freq: Once | INTRAVENOUS | Status: AC
  Administered 2019-11-29: 1000 mL via INTRAVENOUS

## 2019-11-29 MED ORDER — ONDANSETRON HCL 4 MG/2ML IJ SOLN
4.0000 mg | Freq: Once | INTRAMUSCULAR | Status: AC
  Administered 2019-11-29: 4 mg via INTRAVENOUS
  Filled 2019-11-29: qty 2

## 2019-11-29 NOTE — ED Notes (Signed)
Pt sipping PO fluids, states she has no appetite and decline crackers or food. Endorses some nausea with PO fluids. No emesis at this time.

## 2019-11-29 NOTE — ED Notes (Signed)
Lactic Acid 2.1

## 2019-11-29 NOTE — ED Notes (Signed)
Provider at bedside

## 2019-11-29 NOTE — ED Provider Notes (Signed)
[facility] DEPT Provider Note   CSN: [id number] Arrival date & time: 11/29/19  7062     History Chief Complaint  Patient presents with  . Diverticulitis    [name] [name] is a 74 y.o. female with past medical history significant for ischemic colitis, diverticulitis, diabetes, IBS, hypertension, hyperlipidemia, fibromyalgia, hypothyroidism. Abdominal surgical history includes appendectomy.  HPI Patient presents to emergency department today with chief complaint of diverticulitis. Patient was seen in the ED x4 days ago.  She had a CT scan that showed uncomplicated diverticulitis.  She was discharged home with prescription for Cipro and Flagyl. She states since being discharged she has continued to have lower abdominal cramping, nausea, and diarrhea despite taking her antibiotics. She is also endorsing generalized weakness she thinks is from decreased PO intake. She has only been able to tolerate sips of a boost shake 2/2 to nausea. She has been incontinent of stool because she cannot make it to the bathroom fast enough which is unusual for her. She thinks she has had 4 episodes of diarrhea in the last x 24 hours. She describes stool as soft and brown, did not notice any blood in stool. She took an Imodium prior to arrival. Denies fever, chills, cough, shortness of breath, chest pain, back pain, emesis, urinary symptoms. Denies any alcohol consumption.    Past Medical History:  Diagnosis Date  . Alcohol abuse   . Anxiety    takes Valium daily as needed and Ativan daily  . Bilateral hearing loss   . Bipolar 1 disorder ([place])    takes Lithium nightly and Synthroid daily  . Chronic back pain    DDD; "all over" (09/14/2017)  . Colitis, ischemic ([place]) 2012  . Confusion    r/t meds  . Depression    takes Prozac daily and Bupspirone   . Diverticulosis   . Dyslipidemia    takes Crestor daily  . Fibromyalgia   . Headache    "weekly" (09/14/2017)  . Hepatic  steatosis 06/18/12   severe  . Hyperlipidemia   . Hypertension   . Hypothyroidism   . IBS (irritable bowel syndrome)   . Ischemic colitis ([place])   . Joint pain   . Joint swelling   . Lupus erythematosus tumidus    tumid-skin  . Migraine    "1-2/yr; maybe" (09/14/2017)  . Numbness    in right foot  . Osteoarthritis    "all over" (09/14/2017)  . Osteoarthritis cervical spine   . Osteoarthritis of hand    bilateral  . Pneumonia    "walking pneumonia several times; long time since the last time" (09/14/2017)  . Restless leg syndrome    takes Requip nightly  . Sciatica   . Type II diabetes mellitus ([place])   . Urinary frequency   . Urinary leakage   . Urinary urgency   . Urinary, incontinence, stress female   . Walking pneumonia    last time more than 10yrs ago    Patient Active Problem List   Diagnosis Date Noted  . Overweight (BMI 25.0-29.9) 02/08/2019  . S/P left TKA 02/07/2019  . Chest pain   . Chest pain with normal coronary angiography   . Diverticulitis 11/19/2016  . Hypothyroidism 07/14/2016  . Diarrhea   . Generalized abdominal pain   . Major depressive disorder, recurrent severe without psychotic features ([place]) 12/31/2015  . Nausea without vomiting   . Colitis 10/28/2015  . Unintentional poisoning by psychotropic drug 07/05/2015  . Restless leg  syndrome 07/05/2015  . Diabetic polyneuropathy associated with type 2 diabetes mellitus ([place]) 09/21/2014  . Arthritis of left hip 06/09/2013  . Arthritis of right hip 06/08/2013  . Hip pain, right 05/02/2013  . IBS (irritable bowel syndrome) 01/02/2013  . Unspecified vitamin D deficiency 07/05/2012  . Pure hyperglyceridemia 07/04/2012  . Diabetes mellitus with diabetic neuropathy, without long-term current use of insulin ([place]) 06/18/2012  . Hypertension 06/18/2012  . Bipolar 1 disorder ([place]) 06/18/2012  . Low back pain 02/25/2011  . Acute ischemic colitis ([place]) 01/06/2011  . Sciatica 12/19/2010  . Fibromyalgia  10/28/2010  . Primary osteoarthritis of right hip 10/24/2010  . Hyperlipidemia with target low density lipoprotein (LDL) cholesterol less than 100 mg/dL 05/05/2010  . DEPRESSION/ANXIETY 05/05/2010  . ABUSE, ALCOHOL, IN REMISSION 05/05/2010  . OTITIS MEDIA, CHRONIC 05/05/2010  . Hearing loss 05/05/2010  . ALLERGIC RHINITIS, SEASONAL, MILD 05/05/2010  . URINARY INCONTINENCE, STRESS, FEMALE 05/05/2010  . OSTEOARTHRITIS, HANDS, BILATERAL 05/05/2010  . OSTEOARTHRITIS, CERVICAL SPINE 05/05/2010    Past Surgical History:  Procedure Laterality Date  . ABDOMINAL HYSTERECTOMY     "they left my ovaries"  . APPENDECTOMY    . CARDIAC CATHETERIZATION  09/14/2017  . COLONOSCOPY    . DENTAL SURGERY Left 10/2016   dental implant  . DILATION AND CURETTAGE OF UTERUS  X 4  . ESOPHAGOGASTRODUODENOSCOPY    . FLEXIBLE SIGMOIDOSCOPY N/A 06/21/2012   Procedure: FLEXIBLE SIGMOIDOSCOPY;  Surgeon: Jerene Bears, MD;  Location: [facility];  Service: Gastroenterology;  Laterality: N/A;  . JOINT REPLACEMENT    . LEFT HEART CATH AND CORONARY ANGIOGRAPHY N/A 09/14/2017   Procedure: LEFT HEART CATH AND CORONARY ANGIOGRAPHY;  Surgeon: Belva Crome, MD;  Location: [facility];  Service: Cardiovascular;  Laterality: N/A;  . SHOULDER ARTHROSCOPY Right    "shaved spurs off rotator cuff"  . TONSILLECTOMY    . TOTAL HIP ARTHROPLASTY Right 06/09/2013   Procedure: TOTAL HIP ARTHROPLASTY;  Surgeon: Kerin Salen, MD;  Location: [facility];  Service: Orthopedics;  Laterality: Right;  . TOTAL KNEE ARTHROPLASTY Left 02/07/2019   Procedure: TOTAL KNEE ARTHROPLASTY;  Surgeon: Paralee Cancel, MD;  Location: [facility];  Service: Orthopedics;  Laterality: Left;  70 mins  . TUBAL LIGATION    . TUMOR EXCISION Right 1968   angle of jaw; benign     OB History   No obstetric history on file.     Family History  Problem Relation Age of Onset  . Drug abuse Mother   . Alcohol abuse Mother   . Alcohol abuse Father   . Hypertension  Father   . CAD Brother   . Hypertension Brother   . Alcohol abuse Brother   . Hypertension Brother   . Hypertension Brother   . Psoriasis Daughter   . Arthritis Daughter        psoriatic arthritis   . Alcohol abuse Grandchild     Social History   Tobacco Use  . Smoking status: Never Smoker  . Smokeless tobacco: Never Used  Vaping Use  . Vaping Use: Never used  Substance Use Topics  . Alcohol use: Not Currently    Comment: 09/14/2017 "nothing since 04/15/2008"  . Drug use: Not Currently    Frequency: 7.0 times per week    Types: Benzodiazepines    Home Medications Prior to Admission medications   Medication Sig Start Date End Date Taking? Authorizing Provider  acetaminophen (TYLENOL) 500 MG tablet Take 1,000 mg by mouth 3 (  three) times daily as needed for moderate pain.     [provider]  ALPRAZolam Duanne Moron) 0.5 MG tablet Take 1 tablet (0.5 mg total) by mouth 4 (four) times daily as needed for anxiety. 08/03/19   [name], PMHNP  atorvastatin (LIPITOR) 40 MG tablet Take 40 mg by mouth at bedtime.     [provider]  BD PEN NEEDLE NANO 2ND GEN 32G X 4 MM MISC USE TO INJECT 5 TIMES DAILY AS DIRECTED 10/17/19   Elayne Snare, MD  celecoxib (CELEBREX) 200 MG capsule Take 200 mg by mouth at bedtime.    [provider]  Cholecalciferol (VITAMIN D) 50 MCG (2000 UT) tablet Take 2,000 Units by mouth daily.    [provider]  ciprofloxacin (CIPRO) 500 MG tablet Take 1 tablet (500 mg total) by mouth every 12 (twelve) hours for 10 days. 11/25/19 12/05/19  Tegeler, Gwenyth Allegra, MD  Cyanocobalamin (VITAMIN B 12 PO) Take 1 tablet by mouth daily.     [provider]  divalproex (DEPAKOTE) 250 MG DR tablet Take 2 tablets (500 mg total) by mouth at bedtime. 11/06/19   [name], PMHNP  donepezil (ARICEPT) 10 MG tablet Take 1 tablet (10 mg total) by mouth at bedtime. 10/09/19   [name], PMHNP  esomeprazole (NEXIUM) 40 MG capsule Take 40 mg  by mouth daily. 09/21/19   [provider]  gabapentin (NEURONTIN) 600 MG tablet Take 600 mg by mouth 3 (three) times daily. 05/12/19   [provider]  glucose blood (CONTOUR NEXT TEST) test strip Use to test blood sugar 2 times daily 04/28/18   Elayne Snare, MD  Insulin Glargine (LANTUS SOLOSTAR) 100 UNIT/ML Solostar Pen Inject 54 units under the skin once daily. Patient taking differently: Inject 50 Units into the skin at bedtime.  05/05/19   Elayne Snare, MD  levothyroxine (SYNTHROID) 75 MCG tablet TAKE 1 TABLET BY MOUTH DAILY BEFORE BREAKFAST Patient taking differently: Take 75 mcg by mouth daily before breakfast.  11/20/19   Elayne Snare, MD  lithium carbonate (ESKALITH) 450 MG CR tablet Take 1 tablet (450 mg total) by mouth at bedtime. 10/09/19   [name], PMHNP  loperamide (IMODIUM A-D) 2 MG tablet Take 2 mg by mouth 2 (two) times daily as needed for diarrhea or loose stools.    [provider]  losartan (COZAAR) 25 MG tablet Take 12.5 mg by mouth daily. 08/31/19   [provider]  Melatonin 10 MG TBCR Take 10 mg by mouth at bedtime.    [provider]  memantine (NAMENDA) 10 MG tablet TAKE 1 TABLET(10 MG) BY MOUTH EVERY EVENING Patient taking differently: Take 10 mg by mouth daily.  10/09/19   [name], PMHNP  methocarbamol (ROBAXIN) 500 MG tablet Take 1 tablet (500 mg total) by mouth every 6 (six) hours as needed for muscle spasms. 02/08/19   Danae Orleans, PA-C  metroNIDAZOLE (FLAGYL) 500 MG tablet Take 1 tablet (500 mg total) by mouth 3 (three) times daily for 10 days. 11/25/19 12/05/19  Tegeler, Gwenyth Allegra, MD  Microlet Lancets MISC USE DAILY AS DIRECTED 10/24/18   Elayne Snare, MD  nitroGLYCERIN (NITROSTAT) 0.4 MG SL tablet Place 1 tablet (0.4 mg total) under the tongue every 5 (five) minutes as needed for chest pain. 09/14/17   Lyda Jester M, PA-C  ondansetron (ZOFRAN-ODT) 4 MG disintegrating tablet Take 1 tablet (4 mg total) by mouth  every 8 (eight) hours as needed for nausea or vomiting. 11/25/19  Tegeler, Gwenyth Allegra, MD  oxyCODONE-acetaminophen (PERCOCET/ROXICET) 5-325 MG tablet Take 1 tablet by mouth every 4 (four) hours as needed for severe pain. 11/25/19   Tegeler, Gwenyth Allegra, MD  OZEMPIC, 1 MG/DOSE, 4 MG/3ML SOPN Inject 1 mg into the skin once a week. 11/07/19   [provider]  QUEtiapine (SEROQUEL) 200 MG tablet TAKE 1 TAB PO q 5 pm and 1 tab po QHS Patient taking differently: Take 200 mg by mouth See admin instructions. 200mg  q 5 pm and 200mg  po QHS 10/09/19   [name], PMHNP  rOPINIRole (REQUIP) 0.5 MG tablet Takes 4 tabs at 5 pm and 3 tabs at bedtime and 1-2 tabs prn Patient taking differently: Take 0.5-2 mg by mouth See admin instructions. Takes 2mg   at 5 pm and 1.5mg  at bedtime and 0.5-1mg  prn for restless leg 10/09/19   [name], PMHNP    Allergies    Codeine, Macrolides and ketolides, Procaine hcl, Aspirin, Benadryl [diphenhydramine], Diflucan [fluconazole], Dilaudid [hydromorphone hcl], and Sulfa antibiotics  Review of Systems   Review of Systems  All other systems are reviewed and are negative for acute change except as noted in the HPI.   Physical Exam Updated Vital Signs BP (!) 148/72 (BP Location: Right Arm)   Pulse 86   Temp 98.7 F (37.1 C) (Oral)   Resp 18   Ht 5\' 2"  (1.575 m)   Wt 79.4 kg   SpO2 96%   BMI 32.01 kg/m   Physical Exam Vitals and nursing note reviewed.  Constitutional:      General: She is not in acute distress.    Appearance: She is not ill-appearing.  HENT:     Head: Normocephalic and atraumatic.     Right Ear: Tympanic membrane and external ear normal.     Left Ear: Tympanic membrane and external ear normal.     Nose: Nose normal.     Mouth/Throat:     Mouth: Mucous membranes are dry.     Pharynx: Oropharynx is clear.  Eyes:     General: No scleral icterus.       Right eye: No discharge.        Left eye: No discharge.     Extraocular  Movements: Extraocular movements intact.     Conjunctiva/sclera: Conjunctivae normal.     Pupils: Pupils are equal, round, and reactive to light.  Neck:     Vascular: No JVD.  Cardiovascular:     Rate and Rhythm: Normal rate and regular rhythm.     Pulses: Normal pulses.          Radial pulses are 2+ on the right side and 2+ on the left side.     Heart sounds: Normal heart sounds.  Pulmonary:     Comments: Lungs clear to auscultation in all fields. Symmetric chest rise. No wheezing, rales, or rhonchi. Abdominal:     Tenderness: There is no right CVA tenderness or left CVA tenderness.     Comments: Abdomen is soft, non-distended.  Generalized abdominal tenderness worse in the left lower quadrant.  No rigidity, no guarding. No peritoneal signs.  Musculoskeletal:        General: Normal range of motion.     Cervical back: Normal range of motion.  Skin:    General: Skin is warm and dry.     Capillary Refill: Capillary refill takes less than 2 seconds.  Neurological:     Mental Status: She is oriented to person, place, and time.  GCS: GCS eye subscore is 4. GCS verbal subscore is 5. GCS motor subscore is 6.     Comments: Fluent speech, no facial droop.  Psychiatric:        Behavior: Behavior normal.     ED Results / Procedures / Treatments   Labs (all labs ordered are listed, but only abnormal results are displayed) Labs Reviewed  COMPREHENSIVE METABOLIC PANEL - Abnormal; Notable for the following components:      Result Value   Glucose, Bld 126 (*)    BUN 7 (*)    All other components within normal limits  CBC WITH DIFFERENTIAL/PLATELET - Abnormal; Notable for the following components:   Abs Immature Granulocytes 0.08 (*)    All other components within normal limits  LACTIC ACID, PLASMA - Abnormal; Notable for the following components:   Lactic Acid, Venous 2.1 (*)    All other components within normal limits  SARS CORONAVIRUS 2 BY RT PCR (HOSPITAL ORDER, [facility])  LIPASE, BLOOD  LACTIC ACID, PLASMA    EKG None  Radiology CT ABDOMEN PELVIS W CONTRAST  Result Date: 11/29/2019 CLINICAL DATA:  Left lower quadrant abdominal pain EXAM: CT ABDOMEN AND PELVIS WITH CONTRAST TECHNIQUE: Multidetector CT imaging of the abdomen and pelvis was performed using the standard protocol following bolus administration of intravenous contrast. CONTRAST:  1109mL OMNIPAQUE IOHEXOL 300 MG/ML  SOLN COMPARISON:  11/25/2019 FINDINGS: Lower chest: Stable benign 5 mm nodule at the posterior aspect of the left lung base (series 6, image 15). Mild bibasilar subsegmental atelectasis. Heart size is normal. Hepatobiliary: There are 2 somewhat ill-defined areas of low-attenuation within the liver adjacent to the porta hepatis and falciform ligament suggesting sites of focal fatty infiltration (series 2, images 23 and 26), unchanged compared to multiple prior studies. No new focal liver abnormality. Unremarkable appearance of the gallbladder. No hyperdense gallstone or biliary dilatation. Pancreas: Unremarkable. No pancreatic ductal dilatation or surrounding inflammatory changes. Spleen: Normal in size without focal abnormality. Adrenals/Urinary Tract: Unremarkable adrenal glands. 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 Left kidney is unremarkable. Bilateral ureters are nondilated. Urinary bladder appears within normal limits. Stomach/Bowel: Stomach is within normal limits. Appendix not definitively visualized. Scattered colonic diverticulosis. No evidence of bowel wall thickening, distention, or inflammatory changes. Moderate volume of stool throughout the colon. Vascular/Lymphatic: Scattered aortoiliac atherosclerotic calcifications without aneurysm. No abdominopelvic lymphadenopathy. Reproductive: Status post hysterectomy. No adnexal masses. Other: No free fluid. No abdominopelvic fluid collection. No pneumoperitoneum. Small fat  containing umbilical hernia. Musculoskeletal: Right hip arthroplasty. No new or acute osseous findings. IMPRESSION: 1. No acute abdominopelvic findings. 2. Scattered colonic diverticulosis without evidence of acute diverticulitis. 3. Moderate volume of stool throughout the colon. 4. 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 5. Aortic atherosclerosis. (ICD10-I70.0). Electronically Signed   By: Davina Poke D.O.   On: 11/29/2019 13:27    Procedures Procedures (including critical care time)  Medications Ordered in ED Medications  sodium chloride 0.9 % bolus 1,000 mL (0 mLs Intravenous Stopped 11/29/19 1155)  ondansetron (ZOFRAN) injection 4 mg (4 mg Intravenous Given 11/29/19 1011)  morphine 4 MG/ML injection 4 mg (4 mg Intravenous Given 11/29/19 1011)  iohexol (OMNIPAQUE) 300 MG/ML solution 100 mL (100 mLs Intravenous Contrast Given 11/29/19 1303)    ED Course  I have reviewed the triage vital signs and the nursing notes.  Pertinent labs & imaging results that were available during my care of the patient were  reviewed by me and considered in my medical decision making (see chart for details).    MDM Rules/Calculators/A&P                          History provided by patient with additional history obtained from chart review.    74 year old female presenting with abdominal pain and diarrhea after being diagnosed with diverticulitis x4 days ago. She is afebrile, normotensive, no tachycardia or hypoxia. On exam she looks to not feel well however is not toxic in appearance. She has generalized abdominal tenderness, worse in the left lower quadrant. Mucous membranes are dry she appears dehydrated. Labs were collected and show no leukocytosis, no anemia. No significant mitral derangement, no renal insufficiency. Lipase within normal range. Lactic acidosis of 2.1. She is given a liter of IV fluids and when rechecked is down trending 1.6. Covid test is  negative.  Stool studies ordered. Patient did not have any bowel movements in the emergency department therefore they were canceled. On serial abdominal exams abdomen is nontender. She is tolerating p.o. intake.  CT scan performed and shows no diverticulitis.  She does have on her amount of stool throughout colon.  This could be contributing to her abdominal cramping.  Will advise patient to stop taking the antibiotics as she is having severe nausea with the Flagyl.  Discussed symptomatic control including MiraLAX to help with constipation.  The patient appears reasonably screened and/or stabilized for discharge and I doubt any other medical condition or other [facility] requiring further screening, evaluation, or treatment in the ED at this time prior to discharge. The patient is safe for discharge with strict return precautions discussed. Recommend pcp follow up. The patient was discussed with and seen by Dr. Rex Kras who agrees with the treatment plan.   Portions of this note were generated with [employer]. Dictation errors may occur despite best attempts at proofreading.   Final Clinical Impression(s) / ED Diagnoses Final diagnoses:  Diarrhea, unspecified type    Rx / DC Orders ED Discharge Orders    None       Flint Melter 11/29/19 1418    Little, Wenda Overland, MD 11/30/19 1531

## 2019-11-29 NOTE — ED Notes (Signed)
Patient to restroom.

## 2019-11-29 NOTE — ED Notes (Signed)
Provider to bedside

## 2019-11-29 NOTE — Discharge Instructions (Addendum)
Your imaging today shows you do not have diverticulitis.  Please stop taking the antibiotics.  It is important to stay well-hydrated at home.  Drink plenty of fluids.  You can also drink Pedialyte.  Your CT scan did show you have some stool burden. Take Miralax daily for 1 week to help with this. This could also be the cause pf your cramping pain

## 2019-11-29 NOTE — ED Triage Notes (Signed)
Patient came to the ED Saturday for diverticulitis  Worsening symptoms since Saturday Diarrhea and weakness

## 2019-11-30 LAB — CULTURE, BLOOD (ROUTINE X 2)
Culture: NO GROWTH
Culture: NO GROWTH
Special Requests: ADEQUATE

## 2019-12-04 ENCOUNTER — Other Ambulatory Visit (INDEPENDENT_AMBULATORY_CARE_PROVIDER_SITE_OTHER): Payer: Medicare Other

## 2019-12-04 ENCOUNTER — Other Ambulatory Visit: Payer: Self-pay

## 2019-12-04 DIAGNOSIS — Z794 Long term (current) use of insulin: Secondary | ICD-10-CM

## 2019-12-04 DIAGNOSIS — E1165 Type 2 diabetes mellitus with hyperglycemia: Secondary | ICD-10-CM | POA: Diagnosis not present

## 2019-12-04 DIAGNOSIS — E782 Mixed hyperlipidemia: Secondary | ICD-10-CM | POA: Diagnosis not present

## 2019-12-04 LAB — LIPID PANEL
Cholesterol: 230 mg/dL — ABNORMAL HIGH (ref 0–200)
HDL: 56.1 mg/dL (ref >39.00)
NonHDL: 173.95
Total CHOL/HDL Ratio: 4
Triglycerides: 322 mg/dL — ABNORMAL HIGH (ref 0.0–149.0)
VLDL: 64.4 mg/dL — ABNORMAL HIGH (ref 0.0–40.0)

## 2019-12-04 LAB — BASIC METABOLIC PANEL
BUN: 9 mg/dL (ref 6–23)
CO2: 26 mEq/L (ref 19–32)
Calcium: 10.1 mg/dL (ref 8.4–10.5)
Chloride: 104 mEq/L (ref 96–112)
Creatinine, Ser: 0.92 mg/dL (ref 0.40–1.20)
GFR: 60.03 mL/min (ref >60.00)
Glucose, Bld: 154 mg/dL — ABNORMAL HIGH (ref 70–99)
Potassium: 4.7 mEq/L (ref 3.5–5.1)
Sodium: 138 mEq/L (ref 135–145)

## 2019-12-04 LAB — HEMOGLOBIN A1C: Hgb A1c MFr Bld: 6.1 % (ref 4.6–6.5)

## 2019-12-04 LAB — LDL CHOLESTEROL, DIRECT: Direct LDL: 137 mg/dL

## 2019-12-05 ENCOUNTER — Ambulatory Visit: Payer: Medicare Other | Admitting: Psychiatry

## 2019-12-06 ENCOUNTER — Other Ambulatory Visit: Payer: Self-pay | Admitting: Endocrinology

## 2019-12-07 ENCOUNTER — Ambulatory Visit (INDEPENDENT_AMBULATORY_CARE_PROVIDER_SITE_OTHER): Payer: Medicare Other | Admitting: Endocrinology

## 2019-12-07 ENCOUNTER — Other Ambulatory Visit: Payer: Self-pay

## 2019-12-07 VITALS — BP 138/80 | HR 85 | Ht 62.0 in | Wt 179.0 lb

## 2019-12-07 DIAGNOSIS — E039 Hypothyroidism, unspecified: Secondary | ICD-10-CM

## 2019-12-07 DIAGNOSIS — E1169 Type 2 diabetes mellitus with other specified complication: Secondary | ICD-10-CM

## 2019-12-07 DIAGNOSIS — E669 Obesity, unspecified: Secondary | ICD-10-CM | POA: Diagnosis not present

## 2019-12-07 DIAGNOSIS — E782 Mixed hyperlipidemia: Secondary | ICD-10-CM

## 2019-12-07 NOTE — Progress Notes (Signed)
Patient ID: [name] [name], female   DOB: 26-Nov-1945, 74 y.o.   MRN: [id number]            Reason for Appointment: Follow-up for Type 2 Diabetes   History of Present Illness:          Date of diagnosis of type 2 diabetes mellitus: ?  2014        Background history:   She thinks her blood sugar was 400 at the time of diagnosis but no detailed records of this are available She did have an A1c of 10.6 done in 2014 and was probably given Lantus for some time initially Apparently she was treated with various medications including metformin, Janumet and Tradgenta. Her blood sugars had improved and A1c in 2015 was down to 6.2 She tends to have diarrhea with metformin and Janumet and also she thinks it causes dry mouth Most likely Janumet was stopped in 06/2015 Glipizide was started in 8/17 when blood sugars were higher and A1c was 9%  Recent history:   INSULIN regimen is:   Lantus 50 units daily at 5 pm  Humalog: taking 0 units before meals  Non-insulin hypoglycemic drugs the patient is taking are: 1 mg Ozempic weekly  Her A1c is the same at 6.1   Current management, blood sugar patterns and problems identified:  She is now taking 1 mg Ozempic which she is doing without difficulty since her last visit in June  However she said that she is still not able to control her excessive eating and snacking which she is doing because of stress and boredom  Does not exercise because of knee pain  No change in Lantus recently  Also currently not taking any blood sugar readings after dinner and not clear if she has high readings after her evening meals or snacks  Her weight is up a couple of pounds  No hypoglycemia with her Lantus regimen  Side effects from medications have been:?  Diarrhea from metformin and Janumet  Glucose monitoring:  done 1 time a day or less        Glucometer: Contour        Blood Glucose readings   GLUCOSE range 115-168 with most readings in the mornings at  various times AVERAGE 139  Previous readings  PRE-MEAL Fasting Lunch Dinner  overnight Overall  Glucose range:  99-140   105-174  121-145   Mean/median:  121   140  133 129    Self-care: The diet that the patient has been following AT:FTDD, tries to limit drinks with sugar .      Typical meal intake: Breakfast at 9,  May be eggs and sausage and lunch is usually a sandwich   Dinner 7 pm               Dietician visit, most recent: 10/18                Weight history:   Wt Readings from Last 3 Encounters:  12/07/19 179 lb (81.2 kg)  11/29/19 175 lb (79.4 kg)  11/25/19 175 lb (79.4 kg)    Glycemic control:   Lab Results  Component Value Date   HGBA1C 6.1 12/04/2019   HGBA1C 6.1 08/28/2019   HGBA1C 6.7 (H) 04/26/2019   Lab Results  Component Value Date   MICROALBUR 8.3 (H) 08/28/2019   CREATININE 0.92 12/04/2019   Lab Results  Component Value Date   MICRALBCREAT 6.5 08/28/2019       Allergies as  of 12/07/2019      Reactions   Codeine Anxiety, Other (See Comments)   Hallucinations, tolerates oxycodone    Macrolides And Ketolides Nausea And Vomiting   Procaine Hcl Palpitations   Aspirin Nausea And Vomiting, Other (See Comments)   Reaction:  Burns pts stomach    Benadryl [diphenhydramine] Other (See Comments)   Per MD "inhibits potency of gabapentin, lithium etc"   Diflucan [fluconazole] Other (See Comments)   Unknown reaction   Dilaudid [hydromorphone Hcl] Other (See Comments)   Migraines and nightmares    Sulfa Antibiotics Other (See Comments)   Unknown reaction      Medication List       Accurate as of December 07, 2019 10:22 AM. If you have any questions, ask your nurse or doctor.        acetaminophen 500 MG tablet Commonly known as: TYLENOL Take 1,000 mg by mouth 3 (three) times daily as needed for moderate pain.   ALPRAZolam 0.5 MG tablet Commonly known as: XANAX Take 1 tablet (0.5 mg total) by mouth 4 (four) times daily as needed for  anxiety.   atorvastatin 40 MG tablet Commonly known as: LIPITOR Take 40 mg by mouth at bedtime.   BD Pen Needle Nano 2nd Gen 32G X 4 MM Misc Generic drug: Insulin Pen Needle USE TO INJECT 5 TIMES DAILY AS DIRECTED   celecoxib 200 MG capsule Commonly known as: CELEBREX Take 200 mg by mouth at bedtime.   divalproex 250 MG DR tablet Commonly known as: DEPAKOTE Take 2 tablets (500 mg total) by mouth at bedtime.   donepezil 10 MG tablet Commonly known as: ARICEPT Take 1 tablet (10 mg total) by mouth at bedtime.   esomeprazole 40 MG capsule Commonly known as: NEXIUM Take 40 mg by mouth daily.   gabapentin 600 MG tablet Commonly known as: NEURONTIN Take 600 mg by mouth 3 (three) times daily.   glucose blood test strip Commonly known as: Contour Next Test Use to test blood sugar 2 times daily   Lantus SoloStar 100 UNIT/ML Solostar Pen Generic drug: insulin glargine Inject 54 units under the skin once daily. What changed:   how much to take  how to take this  when to take this  additional instructions   levothyroxine 75 MCG tablet Commonly known as: SYNTHROID TAKE 1 TABLET BY MOUTH DAILY BEFORE BREAKFAST What changed: See the new instructions.   lithium carbonate 450 MG CR tablet Commonly known as: ESKALITH Take 1 tablet (450 mg total) by mouth at bedtime.   loperamide 2 MG tablet Commonly known as: IMODIUM A-D Take 2 mg by mouth 2 (two) times daily as needed for diarrhea or loose stools.   losartan 25 MG tablet Commonly known as: COZAAR Take 12.5 mg by mouth daily.   Melatonin 10 MG Tbcr Take 10 mg by mouth at bedtime.   memantine 10 MG tablet Commonly known as: NAMENDA TAKE 1 TABLET(10 MG) BY MOUTH EVERY EVENING What changed:   how much to take  how to take this  when to take this  additional instructions   methocarbamol 500 MG tablet Commonly known as: Robaxin Take 1 tablet (500 mg total) by mouth every 6 (six) hours as needed for muscle  spasms.   Microlet Lancets Misc USE DAILY AS DIRECTED   nitroGLYCERIN 0.4 MG SL tablet Commonly known as: NITROSTAT Place 1 tablet (0.4 mg total) under the tongue every 5 (five) minutes as needed for chest pain.   ondansetron 4 MG  disintegrating tablet Commonly known as: ZOFRAN-ODT Take 1 tablet (4 mg total) by mouth every 8 (eight) hours as needed for nausea or vomiting.   oxyCODONE-acetaminophen 5-325 MG tablet Commonly known as: PERCOCET/ROXICET Take 1 tablet by mouth every 4 (four) hours as needed for severe pain.   Ozempic (1 MG/DOSE) 4 MG/3ML Sopn Generic drug: Semaglutide (1 MG/DOSE) ADMINISTER 1 MG UNDER THE SKIN 1 TIME A WEEK   QUEtiapine 200 MG tablet Commonly known as: SEROQUEL TAKE 1 TAB PO q 5 pm and 1 tab po QHS What changed:   how much to take  how to take this  when to take this  additional instructions   rOPINIRole 0.5 MG tablet Commonly known as: REQUIP Takes 4 tabs at 5 pm and 3 tabs at bedtime and 1-2 tabs prn What changed:   how much to take  how to take this  when to take this  additional instructions   VITAMIN B 12 PO Take 1 tablet by mouth daily.   Vitamin D 50 MCG (2000 UT) tablet Take 2,000 Units by mouth daily.       Allergies:  Allergies  Allergen Reactions  . Codeine Anxiety and Other (See Comments)    Hallucinations, tolerates oxycodone   . Macrolides And Ketolides Nausea And Vomiting  . Procaine Hcl Palpitations  . Aspirin Nausea And Vomiting and Other (See Comments)    Reaction:  Burns pts stomach   . Benadryl [Diphenhydramine] Other (See Comments)    Per MD "inhibits potency of gabapentin, lithium etc"  . Diflucan [Fluconazole] Other (See Comments)    Unknown reaction   . Dilaudid [Hydromorphone Hcl] Other (See Comments)    Migraines and nightmares   . Sulfa Antibiotics Other (See Comments)    Unknown reaction    Past Medical History:  Diagnosis Date  . Alcohol abuse   . Anxiety    takes Valium daily as  needed and Ativan daily  . Bilateral hearing loss   . Bipolar 1 disorder ([place])    takes Lithium nightly and Synthroid daily  . Chronic back pain    DDD; "all over" (09/14/2017)  . Colitis, ischemic ([place]) 2012  . Confusion    r/t meds  . Depression    takes Prozac daily and Bupspirone   . Diverticulosis   . Dyslipidemia    takes Crestor daily  . Fibromyalgia   . Headache    "weekly" (09/14/2017)  . Hepatic steatosis 06/18/12   severe  . Hyperlipidemia   . Hypertension   . Hypothyroidism   . IBS (irritable bowel syndrome)   . Ischemic colitis ([place])   . Joint pain   . Joint swelling   . Lupus erythematosus tumidus    tumid-skin  . Migraine    "1-2/yr; maybe" (09/14/2017)  . Numbness    in right foot  . Osteoarthritis    "all over" (09/14/2017)  . Osteoarthritis cervical spine   . Osteoarthritis of hand    bilateral  . Pneumonia    "walking pneumonia several times; long time since the last time" (09/14/2017)  . Restless leg syndrome    takes Requip nightly  . Sciatica   . Type II diabetes mellitus ([place])   . Urinary frequency   . Urinary leakage   . Urinary urgency   . Urinary, incontinence, stress female   . Walking pneumonia    last time more than 60yrs ago    Past Surgical History:  Procedure Laterality Date  . ABDOMINAL HYSTERECTOMY     "  they left my ovaries"  . APPENDECTOMY    . CARDIAC CATHETERIZATION  09/14/2017  . COLONOSCOPY    . DENTAL SURGERY Left 10/2016   dental implant  . DILATION AND CURETTAGE OF UTERUS  X 4  . ESOPHAGOGASTRODUODENOSCOPY    . FLEXIBLE SIGMOIDOSCOPY N/A 06/21/2012   Procedure: FLEXIBLE SIGMOIDOSCOPY;  Surgeon: Jerene Bears, MD;  Location: [facility];  Service: Gastroenterology;  Laterality: N/A;  . JOINT REPLACEMENT    . LEFT HEART CATH AND CORONARY ANGIOGRAPHY N/A 09/14/2017   Procedure: LEFT HEART CATH AND CORONARY ANGIOGRAPHY;  Surgeon: Belva Crome, MD;  Location: [facility];  Service: Cardiovascular;  Laterality: N/A;  .  SHOULDER ARTHROSCOPY Right    "shaved spurs off rotator cuff"  . TONSILLECTOMY    . TOTAL HIP ARTHROPLASTY Right 06/09/2013   Procedure: TOTAL HIP ARTHROPLASTY;  Surgeon: Kerin Salen, MD;  Location: [facility];  Service: Orthopedics;  Laterality: Right;  . TOTAL KNEE ARTHROPLASTY Left 02/07/2019   Procedure: TOTAL KNEE ARTHROPLASTY;  Surgeon: Paralee Cancel, MD;  Location: [facility];  Service: Orthopedics;  Laterality: Left;  70 mins  . TUBAL LIGATION    . TUMOR EXCISION Right 1968   angle of jaw; benign    Family History  Problem Relation Age of Onset  . Drug abuse Mother   . Alcohol abuse Mother   . Alcohol abuse Father   . Hypertension Father   . CAD Brother   . Hypertension Brother   . Alcohol abuse Brother   . Hypertension Brother   . Hypertension Brother   . Psoriasis Daughter   . Arthritis Daughter        psoriatic arthritis   . Alcohol abuse Grandchild     Social History:  reports that she has never smoked. She has never used smokeless tobacco. She reports previous alcohol use. She reports previous drug use. Frequency: 7.00 times per week. Drug: Benzodiazepines.   Review of Systems    Lipid history: On Lipitor 40 mg  LDL has increased She now says that she forgets to take her Lipitor which she thought was supposed to be taken in the morning  Triglycerides last over 300  Taking OTC fish oil    Lab Results  Component Value Date   CHOL 230 (H) 12/04/2019   HDL 56.10 12/04/2019   LDLDIRECT 137.0 12/04/2019   TRIG 322.0 (H) 12/04/2019   CHOLHDL 4 12/04/2019            Most recent eye exam was In 03/2018, negative  Most recent foot exam: 6/21  THYROID: She has been on levothyroxine and Cytomel from her psychiatrist for several years with uncertain diagnosis She is taking levothyroxine 75 g, with Cytomel 25 g  Also on lithium  Her TSH is consistently normal Free T3 and free T4 are normal  Labs as follows:  Lab Results  Component Value Date   TSH 1.37  08/28/2019   TSH 1.02 04/26/2019   TSH 1.35 07/04/2018   FREET4 0.90 08/28/2019   FREET4 1.01 04/26/2019   FREET4 0.77 07/04/2018   Lab Results  Component Value Date   T3FREE 3.2 08/28/2019   T3FREE 2.7 04/26/2019   T3FREE 2.5 07/04/2018   T3FREE 2.3 12/21/2017   T3FREE 3.0 03/22/2017    She has atypical depression on multiple drugs, has been on Seroquel for quite some time  THYROID nodule:  She was found to have a nodule in her thyroid isthmus in 8/20 Ultrasound done in  1/21 showed multinodular goiter with only 1 significant nodule as below  Right mid thyroid nodule (labeled 2, TR 3) meets criteria for surveillance, this is isoechoic  Previously had mild hypercalcemia, last levels:  Lab Results  Component Value Date   PTH 35 10/25/2018   CALCIUM 10.1 12/04/2019   CAION 1.30 07/14/2016   PHOS 2.9 12/26/2010   Has occasional numbness in her toes   Physical Examination:  BP 138/80 (BP Location: Left Arm, Patient Position: Sitting, Cuff Size: Normal)   Pulse 85   Ht 5\' 2"  (1.575 m)   Wt 179 lb (81.2 kg)   SpO2 97%   BMI 32.74 kg/m      ASSESSMENT:  Diabetes type 2, on insulin  See history of present illness for detailed discussion of current diabetes management, blood sugar patterns, monitor download and problems identified  She is on Ozempic and Lantus has only insulin  Her A1c is consistently 6.1  Despite increasing her Ozempic she has not had any weight loss Also not clear if her sugars are high after meals She is only taking 50 units of Lantus, previously had increased up to 54  Blood sugar monitoring is inconsistent  Probable secondary hypothyroidism: She is on a stable combination of Cytomel and levothyroxine and will have labs checked again  Thyroid nodules: These are insignificant and not clear if she really needs another follow-up ultrasound  PLAN:  Continue 1 mg Ozempic Consultation with dietitian to help her plan her meals  better Encouraged her to start water exercises   She will take her Lipitor at dinnertime since she forgets in the mornings Discussed lipid targets   Follow-up in 3 months  There are no Patient Instructions on file for this visit.        Elayne Snare 12/07/2019, 10:22 AM   Note: This office note was prepared with Dragon voice recognition system technology. Any transcriptional errors that result from this process are unintentional.

## 2019-12-07 NOTE — Patient Instructions (Addendum)
Take Lipitor daily in pm  Water exercises

## 2019-12-08 ENCOUNTER — Emergency Department (HOSPITAL_COMMUNITY): Payer: Medicare Other

## 2019-12-08 ENCOUNTER — Other Ambulatory Visit: Payer: Self-pay

## 2019-12-08 ENCOUNTER — Inpatient Hospital Stay (HOSPITAL_COMMUNITY)
Admission: EM | Admit: 2019-12-08 | Discharge: 2019-12-10 | DRG: 392 | Disposition: A | Discharge status: Home / Self Care | Payer: Medicare Other | Attending: Internal Medicine | Admitting: Internal Medicine

## 2019-12-08 ENCOUNTER — Encounter (HOSPITAL_COMMUNITY): Payer: Self-pay

## 2019-12-08 DIAGNOSIS — E1142 Type 2 diabetes mellitus with diabetic polyneuropathy: Secondary | ICD-10-CM | POA: Diagnosis not present

## 2019-12-08 DIAGNOSIS — K760 Fatty (change of) liver, not elsewhere classified: Secondary | ICD-10-CM | POA: Diagnosis present

## 2019-12-08 DIAGNOSIS — I10 Essential (primary) hypertension: Secondary | ICD-10-CM | POA: Diagnosis not present

## 2019-12-08 DIAGNOSIS — Z794 Long term (current) use of insulin: Secondary | ICD-10-CM

## 2019-12-08 DIAGNOSIS — R109 Unspecified abdominal pain: Secondary | ICD-10-CM | POA: Diagnosis not present

## 2019-12-08 DIAGNOSIS — Z882 Allergy status to sulfonamides status: Secondary | ICD-10-CM

## 2019-12-08 DIAGNOSIS — H9193 Unspecified hearing loss, bilateral: Secondary | ICD-10-CM | POA: Diagnosis present

## 2019-12-08 DIAGNOSIS — M47812 Spondylosis without myelopathy or radiculopathy, cervical region: Secondary | ICD-10-CM | POA: Diagnosis present

## 2019-12-08 DIAGNOSIS — K529 Noninfective gastroenteritis and colitis, unspecified: Secondary | ICD-10-CM | POA: Diagnosis not present

## 2019-12-08 DIAGNOSIS — M797 Fibromyalgia: Secondary | ICD-10-CM | POA: Diagnosis present

## 2019-12-08 DIAGNOSIS — E1140 Type 2 diabetes mellitus with diabetic neuropathy, unspecified: Secondary | ICD-10-CM | POA: Diagnosis present

## 2019-12-08 DIAGNOSIS — Z79890 Hormone replacement therapy (postmenopausal): Secondary | ICD-10-CM

## 2019-12-08 DIAGNOSIS — R0602 Shortness of breath: Secondary | ICD-10-CM | POA: Diagnosis not present

## 2019-12-08 DIAGNOSIS — F419 Anxiety disorder, unspecified: Secondary | ICD-10-CM | POA: Diagnosis present

## 2019-12-08 DIAGNOSIS — E039 Hypothyroidism, unspecified: Secondary | ICD-10-CM | POA: Diagnosis not present

## 2019-12-08 DIAGNOSIS — Z885 Allergy status to narcotic agent status: Secondary | ICD-10-CM

## 2019-12-08 DIAGNOSIS — M19041 Primary osteoarthritis, right hand: Secondary | ICD-10-CM | POA: Diagnosis present

## 2019-12-08 DIAGNOSIS — Z20822 Contact with and (suspected) exposure to covid-19: Secondary | ICD-10-CM | POA: Diagnosis not present

## 2019-12-08 DIAGNOSIS — Z8249 Family history of ischemic heart disease and other diseases of the circulatory system: Secondary | ICD-10-CM

## 2019-12-08 DIAGNOSIS — Z79899 Other long term (current) drug therapy: Secondary | ICD-10-CM

## 2019-12-08 DIAGNOSIS — F32A Depression, unspecified: Secondary | ICD-10-CM | POA: Diagnosis present

## 2019-12-08 DIAGNOSIS — Z886 Allergy status to analgesic agent status: Secondary | ICD-10-CM

## 2019-12-08 DIAGNOSIS — Z888 Allergy status to other drugs, medicaments and biological substances status: Secondary | ICD-10-CM

## 2019-12-08 DIAGNOSIS — Z8701 Personal history of pneumonia (recurrent): Secondary | ICD-10-CM

## 2019-12-08 DIAGNOSIS — G2581 Restless legs syndrome: Secondary | ICD-10-CM | POA: Diagnosis present

## 2019-12-08 DIAGNOSIS — E785 Hyperlipidemia, unspecified: Secondary | ICD-10-CM | POA: Diagnosis present

## 2019-12-08 DIAGNOSIS — M160 Bilateral primary osteoarthritis of hip: Secondary | ICD-10-CM | POA: Diagnosis present

## 2019-12-08 DIAGNOSIS — F319 Bipolar disorder, unspecified: Secondary | ICD-10-CM | POA: Diagnosis not present

## 2019-12-08 DIAGNOSIS — M19042 Primary osteoarthritis, left hand: Secondary | ICD-10-CM | POA: Diagnosis present

## 2019-12-08 DIAGNOSIS — L930 Discoid lupus erythematosus: Secondary | ICD-10-CM | POA: Diagnosis present

## 2019-12-08 DIAGNOSIS — G43909 Migraine, unspecified, not intractable, without status migrainosus: Secondary | ICD-10-CM | POA: Diagnosis present

## 2019-12-08 DIAGNOSIS — G8929 Other chronic pain: Secondary | ICD-10-CM | POA: Diagnosis present

## 2019-12-08 LAB — BASIC METABOLIC PANEL
Anion gap: 11 (ref 5–15)
BUN: 12 mg/dL (ref 8–23)
CO2: 22 mmol/L (ref 22–32)
Calcium: 10.3 mg/dL (ref 8.9–10.3)
Chloride: 104 mmol/L (ref 98–111)
Creatinine, Ser: 0.81 mg/dL (ref 0.44–1.00)
GFR calc Af Amer: >60 mL/min (ref >60)
GFR calc non Af Amer: >60 mL/min (ref >60)
Glucose, Bld: 138 mg/dL — ABNORMAL HIGH (ref 70–99)
Potassium: 4.6 mmol/L (ref 3.5–5.1)
Sodium: 137 mmol/L (ref 135–145)

## 2019-12-08 LAB — HEPATIC FUNCTION PANEL
ALT: 16 U/L (ref 0–44)
AST: 17 U/L (ref 15–41)
Albumin: 4.1 g/dL (ref 3.5–5.0)
Alkaline Phosphatase: 78 U/L (ref 38–126)
Bilirubin, Direct: 0.1 mg/dL (ref 0.0–0.2)
Indirect Bilirubin: 0.5 mg/dL (ref 0.3–0.9)
Total Bilirubin: 0.6 mg/dL (ref 0.3–1.2)
Total Protein: 7 g/dL (ref 6.5–8.1)

## 2019-12-08 LAB — URINALYSIS, ROUTINE W REFLEX MICROSCOPIC
Bilirubin Urine: NEGATIVE
Glucose, UA: NEGATIVE mg/dL
Hgb urine dipstick: NEGATIVE
Ketones, ur: NEGATIVE mg/dL
Leukocytes,Ua: NEGATIVE
Nitrite: NEGATIVE
Protein, ur: NEGATIVE mg/dL
Specific Gravity, Urine: 1.014 (ref 1.005–1.030)
pH: 6 (ref 5.0–8.0)

## 2019-12-08 LAB — CBC
HCT: 47.7 % — ABNORMAL HIGH (ref 36.0–46.0)
Hemoglobin: 15.3 g/dL — ABNORMAL HIGH (ref 12.0–15.0)
MCH: 29.7 pg (ref 26.0–34.0)
MCHC: 32.1 g/dL (ref 30.0–36.0)
MCV: 92.6 fL (ref 80.0–100.0)
Platelets: 275 10*3/uL (ref 150–400)
RBC: 5.15 MIL/uL — ABNORMAL HIGH (ref 3.87–5.11)
RDW: 13.2 % (ref 11.5–15.5)
WBC: 10.3 10*3/uL (ref 4.0–10.5)
nRBC: 0 % (ref 0.0–0.2)

## 2019-12-08 LAB — CBG MONITORING, ED: Glucose-Capillary: 88 mg/dL (ref 70–99)

## 2019-12-08 LAB — LIPASE, BLOOD: Lipase: 34 U/L (ref 11–51)

## 2019-12-08 LAB — LITHIUM LEVEL: Lithium Lvl: 0.31 mmol/L — ABNORMAL LOW (ref 0.60–1.20)

## 2019-12-08 MED ORDER — QUETIAPINE FUMARATE 200 MG PO TABS
200.0000 mg | ORAL_TABLET | Freq: Two times a day (BID) | ORAL | Status: DC
  Administered 2019-12-08 – 2019-12-09 (×3): 200 mg via ORAL
  Filled 2019-12-08: qty 2
  Filled 2019-12-08 (×2): qty 1

## 2019-12-08 MED ORDER — METRONIDAZOLE IN NACL 5-0.79 MG/ML-% IV SOLN
500.0000 mg | Freq: Once | INTRAVENOUS | Status: DC
  Filled 2019-12-08: qty 100

## 2019-12-08 MED ORDER — IOHEXOL 300 MG/ML  SOLN
100.0000 mL | Freq: Once | INTRAMUSCULAR | Status: AC | PRN
  Administered 2019-12-08: 100 mL via INTRAVENOUS

## 2019-12-08 MED ORDER — METHOCARBAMOL 500 MG PO TABS: 500.0000 mg | ORAL_TABLET | Freq: Four times a day (QID) | ORAL | Status: DC | PRN

## 2019-12-08 MED ORDER — ACETAMINOPHEN 325 MG PO TABS
650.0000 mg | ORAL_TABLET | Freq: Four times a day (QID) | ORAL | Status: DC | PRN
  Administered 2019-12-09 – 2019-12-10 (×3): 650 mg via ORAL
  Filled 2019-12-08 (×3): qty 2

## 2019-12-08 MED ORDER — ATORVASTATIN CALCIUM 40 MG PO TABS
40.0000 mg | ORAL_TABLET | Freq: Every day | ORAL | Status: DC
  Administered 2019-12-08 – 2019-12-09 (×2): 40 mg via ORAL
  Filled 2019-12-08 (×2): qty 1

## 2019-12-08 MED ORDER — HYOSCYAMINE SULFATE 0.125 MG PO TABS: 0.1250 mg | ORAL_TABLET | ORAL | Status: DC | PRN

## 2019-12-08 MED ORDER — GABAPENTIN 300 MG PO CAPS
600.0000 mg | ORAL_CAPSULE | Freq: Three times a day (TID) | ORAL | Status: DC
  Administered 2019-12-08 – 2019-12-10 (×5): 600 mg via ORAL
  Filled 2019-12-08 (×5): qty 2

## 2019-12-08 MED ORDER — LEVOTHYROXINE SODIUM 50 MCG PO TABS
75.0000 ug | ORAL_TABLET | Freq: Every day | ORAL | Status: DC
  Administered 2019-12-09 – 2019-12-10 (×2): 75 ug via ORAL
  Filled 2019-12-08 (×2): qty 1

## 2019-12-08 MED ORDER — MORPHINE SULFATE (PF) 4 MG/ML IV SOLN
4.0000 mg | Freq: Once | INTRAVENOUS | Status: AC
  Administered 2019-12-08: 4 mg via INTRAVENOUS
  Filled 2019-12-08: qty 1

## 2019-12-08 MED ORDER — SODIUM CHLORIDE 0.9 % IV SOLN
1.0000 g | Freq: Once | INTRAVENOUS | Status: AC
  Administered 2019-12-08: 1 g via INTRAVENOUS
  Filled 2019-12-08: qty 10

## 2019-12-08 MED ORDER — SODIUM CHLORIDE 0.9 % IV SOLN
2.0000 g | INTRAVENOUS | Status: DC
  Administered 2019-12-09: 2 g via INTRAVENOUS
  Filled 2019-12-08: qty 2

## 2019-12-08 MED ORDER — DIVALPROEX SODIUM 250 MG PO DR TAB
500.0000 mg | DELAYED_RELEASE_TABLET | Freq: Every day | ORAL | Status: DC
  Administered 2019-12-08 – 2019-12-09 (×2): 500 mg via ORAL
  Filled 2019-12-08: qty 2
  Filled 2019-12-08: qty 1

## 2019-12-08 MED ORDER — PANTOPRAZOLE SODIUM 40 MG PO TBEC
80.0000 mg | DELAYED_RELEASE_TABLET | Freq: Every day | ORAL | Status: DC
  Administered 2019-12-09 – 2019-12-10 (×2): 80 mg via ORAL
  Filled 2019-12-08 (×2): qty 2

## 2019-12-08 MED ORDER — MEMANTINE HCL 10 MG PO TABS
10.0000 mg | ORAL_TABLET | Freq: Every day | ORAL | Status: DC
  Administered 2019-12-09: 10 mg via ORAL
  Filled 2019-12-08: qty 1

## 2019-12-08 MED ORDER — INSULIN ASPART 100 UNIT/ML ~~LOC~~ SOLN
0.0000 [IU] | Freq: Three times a day (TID) | SUBCUTANEOUS | Status: DC
  Administered 2019-12-09 – 2019-12-10 (×2): 2 [IU] via SUBCUTANEOUS
  Filled 2019-12-08: qty 0.15

## 2019-12-08 MED ORDER — ALPRAZOLAM 0.5 MG PO TABS
0.5000 mg | ORAL_TABLET | Freq: Four times a day (QID) | ORAL | Status: DC | PRN
  Administered 2019-12-10: 0.5 mg via ORAL
  Filled 2019-12-08: qty 1

## 2019-12-08 MED ORDER — HYOSCYAMINE SULFATE 0.125 MG SL SUBL
0.1250 mg | SUBLINGUAL_TABLET | SUBLINGUAL | Status: DC | PRN
  Filled 2019-12-08: qty 1

## 2019-12-08 MED ORDER — SEMAGLUTIDE (1 MG/DOSE) 4 MG/3ML ~~LOC~~ SOPN: 1.0000 mg | PEN_INJECTOR | SUBCUTANEOUS | Status: DC

## 2019-12-08 MED ORDER — ONDANSETRON HCL 4 MG PO TABS: 4.0000 mg | ORAL_TABLET | Freq: Four times a day (QID) | ORAL | Status: DC | PRN

## 2019-12-08 MED ORDER — METRONIDAZOLE IN NACL 5-0.79 MG/ML-% IV SOLN
500.0000 mg | Freq: Three times a day (TID) | INTRAVENOUS | Status: DC
  Administered 2019-12-09 – 2019-12-10 (×2): 500 mg via INTRAVENOUS
  Filled 2019-12-08 (×2): qty 100

## 2019-12-08 MED ORDER — MORPHINE SULFATE (PF) 2 MG/ML IV SOLN
2.0000 mg | INTRAVENOUS | Status: DC | PRN
  Administered 2019-12-09: 4 mg via INTRAVENOUS
  Administered 2019-12-09: 2 mg via INTRAVENOUS
  Filled 2019-12-08: qty 2
  Filled 2019-12-08: qty 1

## 2019-12-08 MED ORDER — CELECOXIB 200 MG PO CAPS
200.0000 mg | ORAL_CAPSULE | Freq: Every day | ORAL | Status: DC
  Administered 2019-12-08 – 2019-12-09 (×2): 200 mg via ORAL
  Filled 2019-12-08 (×2): qty 1

## 2019-12-08 MED ORDER — LITHIUM CARBONATE ER 450 MG PO TBCR
450.0000 mg | EXTENDED_RELEASE_TABLET | Freq: Two times a day (BID) | ORAL | Status: DC
  Administered 2019-12-08 – 2019-12-10 (×4): 450 mg via ORAL
  Filled 2019-12-08 (×4): qty 1

## 2019-12-08 MED ORDER — ACETAMINOPHEN 650 MG RE SUPP: 650.0000 mg | Freq: Four times a day (QID) | RECTAL | Status: DC | PRN

## 2019-12-08 MED ORDER — SODIUM CHLORIDE 0.9 % IV SOLN: 1.0000 g | INTRAVENOUS | Status: DC

## 2019-12-08 MED ORDER — INSULIN GLARGINE 100 UNIT/ML ~~LOC~~ SOLN: 35.0000 [IU] | Freq: Every day | SUBCUTANEOUS | Status: DC

## 2019-12-08 MED ORDER — ONDANSETRON HCL 4 MG/2ML IJ SOLN
4.0000 mg | Freq: Once | INTRAMUSCULAR | Status: AC
  Administered 2019-12-08: 4 mg via INTRAVENOUS
  Filled 2019-12-08: qty 2

## 2019-12-08 MED ORDER — ONDANSETRON HCL 4 MG/2ML IJ SOLN
4.0000 mg | Freq: Four times a day (QID) | INTRAMUSCULAR | Status: DC | PRN
  Administered 2019-12-09 (×2): 4 mg via INTRAVENOUS
  Filled 2019-12-08 (×2): qty 2

## 2019-12-08 MED ORDER — DONEPEZIL HCL 10 MG PO TABS
10.0000 mg | ORAL_TABLET | Freq: Every day | ORAL | Status: DC
  Administered 2019-12-08 – 2019-12-09 (×2): 10 mg via ORAL
  Filled 2019-12-08 (×2): qty 1

## 2019-12-08 MED ORDER — MELATONIN 5 MG PO TABS
10.0000 mg | ORAL_TABLET | Freq: Every day | ORAL | Status: DC
  Administered 2019-12-08 – 2019-12-09 (×2): 10 mg via ORAL
  Filled 2019-12-08 (×2): qty 2

## 2019-12-08 MED ORDER — INSULIN GLARGINE 100 UNIT/ML ~~LOC~~ SOLN
30.0000 [IU] | Freq: Every day | SUBCUTANEOUS | Status: DC
  Administered 2019-12-08 – 2019-12-09 (×2): 30 [IU] via SUBCUTANEOUS
  Filled 2019-12-08 (×2): qty 0.3

## 2019-12-08 MED ORDER — ROPINIROLE HCL 1 MG PO TABS
2.0000 mg | ORAL_TABLET | Freq: Two times a day (BID) | ORAL | Status: DC
  Administered 2019-12-08 – 2019-12-09 (×3): 2 mg via ORAL
  Filled 2019-12-08 (×3): qty 2

## 2019-12-08 MED ORDER — LACTATED RINGERS IV SOLN: INTRAVENOUS | Status: DC

## 2019-12-08 MED ORDER — LACTATED RINGERS IV BOLUS
1000.0000 mL | Freq: Once | INTRAVENOUS | Status: AC
  Administered 2019-12-08: 1000 mL via INTRAVENOUS

## 2019-12-08 MED ORDER — ENOXAPARIN SODIUM 40 MG/0.4ML ~~LOC~~ SOLN
40.0000 mg | SUBCUTANEOUS | Status: DC
  Administered 2019-12-08 – 2019-12-09 (×2): 40 mg via SUBCUTANEOUS
  Filled 2019-12-08 (×2): qty 0.4

## 2019-12-08 NOTE — ED Provider Notes (Signed)
[facility] DEPT Provider Note   CSN: [id number] Arrival date & time: 12/08/19  1258     History Chief Complaint  Patient presents with  . Shortness of Breath  . Abdominal Pain    [name] [name] is a 74 y.o. female.  Patient is a 74 year old female with a history of alcohol abuse, bipolar disease on lithium, diverticulosis, hypertension, hypothyroidism, ischemic colitis who is presenting today with complaints of lower abdominal pain.  Patient has been seen 3 times in the last month for abdominal pain.  On first visit patient was diagnosed with diverticulitis and was placed on Cipro and Flagyl.  However she reported the Flagyl made her very nauseated and she was not able to tolerate it.  She then returned on 11/29/2019 and at that time had repeat CT which showed no acute findings and she was told to discontinue the antibiotic which she did.  She reports about 4 to 5 days ago the abdominal pain started to return and has just gradually worsened over this timeframe.  It is worse in the left lower quadrant but throughout the lower abdomen.  She has terrible nausea but no vomiting and she has had intermittent loose stools at least 2 or 3 a day.  However it is not frank diarrhea and there is been no blood in the stool.  Over the last 4 to 5 days she has eat and drink very little because every time she eats and drinks it makes her abdomen hurt worse and become more crampy.  She denies any urinary symptoms such as frequency, urgency or dysuria.  She denies cough but has noticed in the last few days when she gets up and moves around she is slightly winded.  She has not had any chest pain or fever.  She denies any prior cardiac history and denies leg swelling or abdominal swelling.  The history is provided by the patient.  Shortness of Breath Associated symptoms: abdominal pain   Abdominal Pain Associated symptoms: shortness of breath        Past Medical History:    Diagnosis Date  . Alcohol abuse   . Anxiety    takes Valium daily as needed and Ativan daily  . Bilateral hearing loss   . Bipolar 1 disorder ([place])    takes Lithium nightly and Synthroid daily  . Chronic back pain    DDD; "all over" (09/14/2017)  . Colitis, ischemic ([place]) 2012  . Confusion    r/t meds  . Depression    takes Prozac daily and Bupspirone   . Diverticulosis   . Dyslipidemia    takes Crestor daily  . Fibromyalgia   . Headache    "weekly" (09/14/2017)  . Hepatic steatosis 06/18/12   severe  . Hyperlipidemia   . Hypertension   . Hypothyroidism   . IBS (irritable bowel syndrome)   . Ischemic colitis ([place])   . Joint pain   . Joint swelling   . Lupus erythematosus tumidus    tumid-skin  . Migraine    "1-2/yr; maybe" (09/14/2017)  . Numbness    in right foot  . Osteoarthritis    "all over" (09/14/2017)  . Osteoarthritis cervical spine   . Osteoarthritis of hand    bilateral  . Pneumonia    "walking pneumonia several times; long time since the last time" (09/14/2017)  . Restless leg syndrome    takes Requip nightly  . Sciatica   . Type II diabetes mellitus ([place])   .  Urinary frequency   . Urinary leakage   . Urinary urgency   . Urinary, incontinence, stress female   . Walking pneumonia    last time more than 94yrs ago    Patient Active Problem List   Diagnosis Date Noted  . Overweight (BMI 25.0-29.9) 02/08/2019  . S/P left TKA 02/07/2019  . Chest pain   . Chest pain with normal coronary angiography   . Diverticulitis 11/19/2016  . Hypothyroidism 07/14/2016  . Diarrhea   . Generalized abdominal pain   . Major depressive disorder, recurrent severe without psychotic features ([place]) 12/31/2015  . Nausea without vomiting   . Colitis 10/28/2015  . Unintentional poisoning by psychotropic drug 07/05/2015  . Restless leg syndrome 07/05/2015  . Diabetic polyneuropathy associated with type 2 diabetes mellitus ([place]) 09/21/2014  . Arthritis of left hip 06/09/2013   . Arthritis of right hip 06/08/2013  . Hip pain, right 05/02/2013  . IBS (irritable bowel syndrome) 01/02/2013  . Unspecified vitamin D deficiency 07/05/2012  . Pure hyperglyceridemia 07/04/2012  . Diabetes mellitus with diabetic neuropathy, without long-term current use of insulin ([place]) 06/18/2012  . Hypertension 06/18/2012  . Bipolar 1 disorder ([place]) 06/18/2012  . Low back pain 02/25/2011  . Acute ischemic colitis ([place]) 01/06/2011  . Sciatica 12/19/2010  . Fibromyalgia 10/28/2010  . Primary osteoarthritis of right hip 10/24/2010  . Hyperlipidemia with target low density lipoprotein (LDL) cholesterol less than 100 mg/dL 05/05/2010  . DEPRESSION/ANXIETY 05/05/2010  . ABUSE, ALCOHOL, IN REMISSION 05/05/2010  . OTITIS MEDIA, CHRONIC 05/05/2010  . Hearing loss 05/05/2010  . ALLERGIC RHINITIS, SEASONAL, MILD 05/05/2010  . URINARY INCONTINENCE, STRESS, FEMALE 05/05/2010  . OSTEOARTHRITIS, HANDS, BILATERAL 05/05/2010  . OSTEOARTHRITIS, CERVICAL SPINE 05/05/2010    Past Surgical History:  Procedure Laterality Date  . ABDOMINAL HYSTERECTOMY     "they left my ovaries"  . APPENDECTOMY    . CARDIAC CATHETERIZATION  09/14/2017  . COLONOSCOPY    . DENTAL SURGERY Left 10/2016   dental implant  . DILATION AND CURETTAGE OF UTERUS  X 4  . ESOPHAGOGASTRODUODENOSCOPY    . FLEXIBLE SIGMOIDOSCOPY N/A 06/21/2012   Procedure: FLEXIBLE SIGMOIDOSCOPY;  Surgeon: Jerene Bears, MD;  Location: [facility];  Service: Gastroenterology;  Laterality: N/A;  . JOINT REPLACEMENT    . LEFT HEART CATH AND CORONARY ANGIOGRAPHY N/A 09/14/2017   Procedure: LEFT HEART CATH AND CORONARY ANGIOGRAPHY;  Surgeon: Belva Crome, MD;  Location: [facility];  Service: Cardiovascular;  Laterality: N/A;  . SHOULDER ARTHROSCOPY Right    "shaved spurs off rotator cuff"  . TONSILLECTOMY    . TOTAL HIP ARTHROPLASTY Right 06/09/2013   Procedure: TOTAL HIP ARTHROPLASTY;  Surgeon: Kerin Salen, MD;  Location: [facility];   Service: Orthopedics;  Laterality: Right;  . TOTAL KNEE ARTHROPLASTY Left 02/07/2019   Procedure: TOTAL KNEE ARTHROPLASTY;  Surgeon: Paralee Cancel, MD;  Location: [facility];  Service: Orthopedics;  Laterality: Left;  70 mins  . TUBAL LIGATION    . TUMOR EXCISION Right 1968   angle of jaw; benign     OB History   No obstetric history on file.     Family History  Problem Relation Age of Onset  . Drug abuse Mother   . Alcohol abuse Mother   . Alcohol abuse Father   . Hypertension Father   . CAD Brother   . Hypertension Brother   . Alcohol abuse Brother   . Hypertension Brother   . Hypertension Brother   .  Psoriasis Daughter   . Arthritis Daughter        psoriatic arthritis   . Alcohol abuse Grandchild     Social History   Tobacco Use  . Smoking status: Never Smoker  . Smokeless tobacco: Never Used  Vaping Use  . Vaping Use: Never used  Substance Use Topics  . Alcohol use: Not Currently    Comment: 09/14/2017 "nothing since 04/15/2008"  . Drug use: Not Currently    Frequency: 7.0 times per week    Types: Benzodiazepines    Home Medications Prior to Admission medications   Medication Sig Start Date End Date Taking? Authorizing Provider  acetaminophen (TYLENOL) 500 MG tablet Take 1,000 mg by mouth 3 (three) times daily as needed for moderate pain.     [provider]  ALPRAZolam Duanne Moron) 0.5 MG tablet Take 1 tablet (0.5 mg total) by mouth 4 (four) times daily as needed for anxiety. 08/03/19   [name], PMHNP  atorvastatin (LIPITOR) 40 MG tablet Take 40 mg by mouth at bedtime.     [provider]  BD PEN NEEDLE NANO 2ND GEN 32G X 4 MM MISC USE TO INJECT 5 TIMES DAILY AS DIRECTED 10/17/19   Elayne Snare, MD  celecoxib (CELEBREX) 200 MG capsule Take 200 mg by mouth at bedtime.    [provider]  Cholecalciferol (VITAMIN D) 50 MCG (2000 UT) tablet Take 2,000 Units by mouth daily.    [provider]  Cyanocobalamin (VITAMIN B 12 PO) Take 1  tablet by mouth daily.     [provider]  divalproex (DEPAKOTE) 250 MG DR tablet Take 2 tablets (500 mg total) by mouth at bedtime. 11/06/19   [name], PMHNP  donepezil (ARICEPT) 10 MG tablet Take 1 tablet (10 mg total) by mouth at bedtime. 10/09/19   [name], PMHNP  esomeprazole (NEXIUM) 40 MG capsule Take 40 mg by mouth daily. 09/21/19   [provider]  gabapentin (NEURONTIN) 600 MG tablet Take 600 mg by mouth 3 (three) times daily. 05/12/19   [provider]  glucose blood (CONTOUR NEXT TEST) test strip Use to test blood sugar 2 times daily 04/28/18   Elayne Snare, MD  Insulin Glargine (LANTUS SOLOSTAR) 100 UNIT/ML Solostar Pen Inject 54 units under the skin once daily. Patient taking differently: Inject 50 Units into the skin at bedtime.  05/05/19   Elayne Snare, MD  levothyroxine (SYNTHROID) 75 MCG tablet TAKE 1 TABLET BY MOUTH DAILY BEFORE BREAKFAST Patient taking differently: Take 75 mcg by mouth daily before breakfast.  11/20/19   Elayne Snare, MD  lithium carbonate (ESKALITH) 450 MG CR tablet Take 1 tablet (450 mg total) by mouth at bedtime. 10/09/19   [name], PMHNP  loperamide (IMODIUM A-D) 2 MG tablet Take 2 mg by mouth 2 (two) times daily as needed for diarrhea or loose stools.    [provider]  losartan (COZAAR) 25 MG tablet Take 12.5 mg by mouth daily. 08/31/19   [provider]  Melatonin 10 MG TBCR Take 10 mg by mouth at bedtime.    [provider]  memantine (NAMENDA) 10 MG tablet TAKE 1 TABLET(10 MG) BY MOUTH EVERY EVENING Patient taking differently: Take 10 mg by mouth daily.  10/09/19   [name], PMHNP  methocarbamol (ROBAXIN) 500 MG tablet Take 1 tablet (500 mg total) by mouth every 6 (six) hours as needed for muscle spasms. 02/08/19   Danae Orleans, PA-C  Microlet Lancets MISC USE DAILY AS DIRECTED  10/24/18   Elayne Snare, MD  nitroGLYCERIN (NITROSTAT) 0.4 MG SL tablet Place 1 tablet (0.4 mg total) under  the tongue every 5 (five) minutes as needed for chest pain. 09/14/17   Lyda Jester M, PA-C  ondansetron (ZOFRAN-ODT) 4 MG disintegrating tablet Take 1 tablet (4 mg total) by mouth every 8 (eight) hours as needed for nausea or vomiting. 11/25/19   Tegeler, Gwenyth Allegra, MD  oxyCODONE-acetaminophen (PERCOCET/ROXICET) 5-325 MG tablet Take 1 tablet by mouth every 4 (four) hours as needed for severe pain. 11/25/19   Tegeler, Gwenyth Allegra, MD  OZEMPIC, 1 MG/DOSE, 4 MG/3ML SOPN ADMINISTER 1 MG UNDER THE SKIN 1 TIME A WEEK 12/06/19   Elayne Snare, MD  QUEtiapine (SEROQUEL) 200 MG tablet TAKE 1 TAB PO q 5 pm and 1 tab po QHS Patient taking differently: Take 200 mg by mouth See admin instructions. 200mg  q 5 pm and 200mg  po QHS 10/09/19   [name], PMHNP  rOPINIRole (REQUIP) 0.5 MG tablet Takes 4 tabs at 5 pm and 3 tabs at bedtime and 1-2 tabs prn Patient taking differently: Take 0.5-2 mg by mouth See admin instructions. Takes 2mg   at 5 pm and 1.5mg  at bedtime and 0.5-1mg  prn for restless leg 10/09/19   [name], PMHNP    Allergies    Codeine, Macrolides and ketolides, Procaine hcl, Aspirin, Benadryl [diphenhydramine], Diflucan [fluconazole], Dilaudid [hydromorphone hcl], and Sulfa antibiotics  Review of Systems   Review of Systems  Respiratory: Positive for shortness of breath.   Gastrointestinal: Positive for abdominal pain.  All other systems reviewed and are negative.   Physical Exam Updated Vital Signs BP (!) 155/80 (BP Location: Left Arm)   Pulse 87   Temp 97.9 F (36.6 C) (Oral)   Resp 20   Ht 5\' 2"  (1.575 m)   Wt 81.2 kg   SpO2 99%   BMI 32.74 kg/m   Physical Exam Vitals and nursing note reviewed.  Constitutional:      General: She is in acute distress.     Appearance: She is well-developed.  HENT:     Head: Normocephalic and atraumatic.  Eyes:     Pupils: Pupils are equal, round, and reactive to light.  Cardiovascular:     Rate and Rhythm: Normal rate and regular  rhythm.     Heart sounds: Normal heart sounds. No murmur heard.  No friction rub.  Pulmonary:     Effort: Pulmonary effort is normal.     Breath sounds: Normal breath sounds. No wheezing or rales.  Abdominal:     General: Bowel sounds are normal. There is no distension.     Palpations: Abdomen is soft.     Tenderness: There is no abdominal tenderness. There is no guarding or rebound.  Musculoskeletal:        General: No tenderness. Normal range of motion.     Comments: No edema  Skin:    General: Skin is warm and dry.     Findings: No rash.  Neurological:     Mental Status: She is alert and oriented to person, place, and time.     Cranial Nerves: No cranial nerve deficit.  Psychiatric:        Behavior: Behavior normal.     ED Results / Procedures / Treatments   Labs (all labs ordered are listed, but only abnormal results are displayed) Labs Reviewed  BASIC METABOLIC PANEL - Abnormal; Notable for the following components:      Result Value  Glucose, Bld 138 (*)    All other components within normal limits  CBC - Abnormal; Notable for the following components:   RBC 5.15 (*)    Hemoglobin 15.3 (*)    HCT 47.7 (*)    All other components within normal limits  LITHIUM LEVEL - Abnormal; Notable for the following components:   Lithium Lvl 0.31 (*)    All other components within normal limits  RESPIRATORY PANEL BY RT PCR (FLU A&B, COVID)  HEPATIC FUNCTION PANEL  LIPASE, BLOOD  URINALYSIS, ROUTINE W REFLEX MICROSCOPIC    EKG None ED ECG REPORT   Date: 12/08/2019  Rate: 78  Rhythm: normal sinus rhythm  QRS Axis: normal  Intervals: normal  ST/T Wave abnormalities: low voltage  Conduction Disutrbances:none  Narrative Interpretation:   Old EKG Reviewed: unchanged  I have personally reviewed the EKG tracing and agree with the computerized printout as noted.   Radiology DG Chest 2 View  Result Date: 12/08/2019 CLINICAL DATA:  Shortness of breath today, chronic  abdominal pain worsening, history of diverticulitis EXAM: CHEST - 2 VIEW COMPARISON:  11/17/2018 FINDINGS: Normal heart size and pulmonary vascularity. Tortuous thoracic aorta. Lungs clear. No pulmonary infiltrate, pleural effusion or pneumothorax. EKG lead projects over RIGHT upper lobe. Levoconvex thoracolumbar scoliosis. IMPRESSION: No acute abnormalities. Electronically Signed   By: Lavonia Dana M.D.   On: 12/08/2019 13:55    Procedures Procedures (including critical care time)  Medications Ordered in ED Medications  morphine 4 MG/ML injection 4 mg (has no administration in time range)  ondansetron (ZOFRAN) injection 4 mg (has no administration in time range)  lactated ringers bolus 1,000 mL (has no administration in time range)    ED Course  I have reviewed the triage vital signs and the nursing notes.  Pertinent labs & imaging results that were available during my care of the patient were reviewed by me and considered in my medical decision making (see chart for details).    MDM Rules/Calculators/A&P                          74 year old female presenting with worsening abdominal pain.  This is patient's third visit in the last 1 month for similar symptoms.  Patient was last seen 6 days ago and had a scan that showed resolution of the diverticulitis after taking only 3 to 4 days of antibiotics.  She discontinued the antibiotics but now pain has returned.  Concern for recurrent diverticulitis versus urinary cause versus renal stone.  Patient did notice that she was short of breath today with exertion but most likely related to dehydration as she has not had any cough, fever or shortness of breath at rest.  Her breath sounds are clear and she does not have significant lung pathology.  EKG without acute findings.  Low suspicion for ACS or PE.  Labs show hemoconcentration with a hemoglobin of 15 and a leukocytosis of 10,000.  BMP is within normal limits but will add on LFTs and lipase.  As  patient does have a history in the past of alcohol abuse will ensure that this is not pancreatitis or hepatitis.  Pt given IVF/pain and nausea meds  8:43 PM LFTs and lipase within normal limits.  Patient CT today shows diffuse segmental thickening of the mid to distal colon from the distal transverse to rectosigmoid area consistent with colitis infectious or inflammatory.  There is no evidence of perforation or abscess formation and slightly irregular  region that could be from a colonic diverticulum.  She does have chronic right UPJ obstruction and right hydronephrosis which is unchanged.  Speaking with patient she is still having significant abdominal pain.  She has had a hard time tolerating food and fluid at home given this is her third visit for similar symptoms will start IV Rocephin and Flagyl and admit for further care.  Continued on IV fluids and given pain control.  MDM Number of Diagnoses or Management Options   Amount and/or Complexity of Data Reviewed Clinical lab tests: ordered and reviewed Tests in the radiology section of CPT: ordered and reviewed Tests in the medicine section of CPT: ordered and reviewed Decide to obtain previous medical records or to obtain history from someone other than the patient: yes Obtain history from someone other than the patient: yes Review and summarize past medical records: yes Discuss the patient with other providers: yes Independent visualization of images, tracings, or specimens: yes  Risk of Complications, Morbidity, and/or Mortality Presenting problems: moderate Diagnostic procedures: low Management options: moderate  Patient Progress Patient progress: stable    Final Clinical Impression(s) / ED Diagnoses Final diagnoses:  Colitis    Rx / DC Orders ED Discharge Orders    None       Blanchie Dessert, MD 12/08/19 2045

## 2019-12-08 NOTE — ED Triage Notes (Signed)
Patient reports that she has had SOB since early this AM. Patient also c/o lower abdominal pain and reports a history of diverticulitis x 6 days.

## 2019-12-08 NOTE — H&P (Signed)
History and Physical    [name] [name] KGY:[id number] DOB: Jun 16, 1945 DOA: 12/08/2019  PCP: London Pepper, MD  Patient coming from: Home  I have personally briefly reviewed patient's old medical records in [place]  Chief Complaint: Abd pain  HPI: [name] [name] is a 74 y.o. female with medical history significant of BPD1, DM2.  Pt diagnosed with diverticulitis earlier this month.  Took 4 days of cipro/flagyl then had nausea which she attributed to medications and returned to ED.  Repeat CT showed resolution of diverticulitis.  Pt advised to stop ABx.  Pt had recurrence of abd pain 4-5 days ago.  Associated nausea, intermittent loose stools 2-3 times a day.  No blood in stool, no frank diarrhea.  Eating makes pain worse.  No fevers, chills.   ED Course: repeat CT shows colitis of transverse and descending colon.  Rocephin, flagyl, morphine, medicine asked to admit.   Review of Systems: As per HPI, otherwise all review of systems negative.  Past Medical History:  Diagnosis Date  . Alcohol abuse   . Anxiety    takes Valium daily as needed and Ativan daily  . Bilateral hearing loss   . Bipolar 1 disorder ([place])    takes Lithium nightly and Synthroid daily  . Chronic back pain    DDD; "all over" (09/14/2017)  . Colitis, ischemic ([place]) 2012  . Confusion    r/t meds  . Depression    takes Prozac daily and Bupspirone   . Diverticulosis   . Dyslipidemia    takes Crestor daily  . Fibromyalgia   . Headache    "weekly" (09/14/2017)  . Hepatic steatosis 06/18/12   severe  . Hyperlipidemia   . Hypertension   . Hypothyroidism   . IBS (irritable bowel syndrome)   . Ischemic colitis ([place])   . Joint pain   . Joint swelling   . Lupus erythematosus tumidus    tumid-skin  . Migraine    "1-2/yr; maybe" (09/14/2017)  . Numbness    in right foot  . Osteoarthritis    "all over" (09/14/2017)  . Osteoarthritis cervical spine   . Osteoarthritis of hand    bilateral  .  Pneumonia    "walking pneumonia several times; long time since the last time" (09/14/2017)  . Restless leg syndrome    takes Requip nightly  . Sciatica   . Type II diabetes mellitus ([place])   . Urinary frequency   . Urinary leakage   . Urinary urgency   . Urinary, incontinence, stress female   . Walking pneumonia    last time more than 32yrs ago    Past Surgical History:  Procedure Laterality Date  . ABDOMINAL HYSTERECTOMY     "they left my ovaries"  . APPENDECTOMY    . CARDIAC CATHETERIZATION  09/14/2017  . COLONOSCOPY    . DENTAL SURGERY Left 10/2016   dental implant  . DILATION AND CURETTAGE OF UTERUS  X 4  . ESOPHAGOGASTRODUODENOSCOPY    . FLEXIBLE SIGMOIDOSCOPY N/A 06/21/2012   Procedure: FLEXIBLE SIGMOIDOSCOPY;  Surgeon: Jerene Bears, MD;  Location: [facility];  Service: Gastroenterology;  Laterality: N/A;  . JOINT REPLACEMENT    . LEFT HEART CATH AND CORONARY ANGIOGRAPHY N/A 09/14/2017   Procedure: LEFT HEART CATH AND CORONARY ANGIOGRAPHY;  Surgeon: Belva Crome, MD;  Location: [facility];  Service: Cardiovascular;  Laterality: N/A;  . SHOULDER ARTHROSCOPY Right    "shaved spurs off rotator cuff"  . TONSILLECTOMY    .  TOTAL HIP ARTHROPLASTY Right 06/09/2013   Procedure: TOTAL HIP ARTHROPLASTY;  Surgeon: Kerin Salen, MD;  Location: [facility];  Service: Orthopedics;  Laterality: Right;  . TOTAL KNEE ARTHROPLASTY Left 02/07/2019   Procedure: TOTAL KNEE ARTHROPLASTY;  Surgeon: Paralee Cancel, MD;  Location: [facility];  Service: Orthopedics;  Laterality: Left;  70 mins  . TUBAL LIGATION    . TUMOR EXCISION Right 1968   angle of jaw; benign     reports that she has never smoked. She has never used smokeless tobacco. She reports previous alcohol use. She reports previous drug use. Frequency: 7.00 times per week. Drug: Benzodiazepines.  Allergies  Allergen Reactions  . Codeine Anxiety and Other (See Comments)    Hallucinations, tolerates oxycodone   . Macrolides And  Ketolides Nausea And Vomiting  . Procaine Hcl Palpitations  . Aspirin Nausea And Vomiting and Other (See Comments)    Reaction:  Burns pts stomach   . Benadryl [Diphenhydramine] Other (See Comments)    Per MD "inhibits potency of gabapentin, lithium etc"  . Diflucan [Fluconazole] Other (See Comments)    Unknown reaction   . Dilaudid [Hydromorphone Hcl] Other (See Comments)    Migraines and nightmares   . Sulfa Antibiotics Other (See Comments)    Unknown reaction    Family History  Problem Relation Age of Onset  . Drug abuse Mother   . Alcohol abuse Mother   . Alcohol abuse Father   . Hypertension Father   . CAD Brother   . Hypertension Brother   . Alcohol abuse Brother   . Hypertension Brother   . Hypertension Brother   . Psoriasis Daughter   . Arthritis Daughter        psoriatic arthritis   . Alcohol abuse Grandchild      Prior to Admission medications   Medication Sig Start Date End Date Taking? Authorizing Provider  acetaminophen (TYLENOL) 500 MG tablet Take 1,000 mg by mouth 3 (three) times daily as needed for moderate pain.    Yes [provider]  ALPRAZolam (XANAX) 0.5 MG tablet Take 1 tablet (0.5 mg total) by mouth 4 (four) times daily as needed for anxiety. 08/03/19  Yes [name], PMHNP  atorvastatin (LIPITOR) 40 MG tablet Take 40 mg by mouth at bedtime.    Yes [provider]  celecoxib (CELEBREX) 200 MG capsule Take 200 mg by mouth at bedtime.   Yes [provider]  Cholecalciferol (VITAMIN D) 50 MCG (2000 UT) tablet Take 2,000 Units by mouth daily.   Yes [provider]  Cyanocobalamin (VITAMIN B 12 PO) Take 1 tablet by mouth daily.    Yes [provider]  divalproex (DEPAKOTE) 250 MG DR tablet Take 2 tablets (500 mg total) by mouth at bedtime. 11/06/19  Yes [name], PMHNP  donepezil (ARICEPT) 10 MG tablet Take 1 tablet (10 mg total) by mouth at bedtime. 10/09/19  Yes [name], PMHNP  esomeprazole  (NEXIUM) 40 MG capsule Take 40 mg by mouth daily. 09/21/19  Yes [provider]  gabapentin (NEURONTIN) 600 MG tablet Take 600 mg by mouth 3 (three) times daily. 05/12/19  Yes [provider]  hyoscyamine (LEVSIN) 0.125 MG tablet Take 0.125 mg by mouth every 4 (four) hours as needed for bladder spasms.  11/28/19  Yes [provider]  Insulin Glargine (LANTUS SOLOSTAR) 100 UNIT/ML Solostar Pen Inject 54 units under the skin once daily. Patient taking differently: Inject 50 Units into the skin at bedtime.  05/05/19  Yes Elayne Snare, MD  levothyroxine (SYNTHROID) 75 MCG tablet TAKE 1 TABLET BY MOUTH DAILY BEFORE BREAKFAST Patient taking differently: Take 75 mcg by mouth daily before breakfast.  11/20/19  Yes Elayne Snare, MD  lithium carbonate (ESKALITH) 450 MG CR tablet Take 1 tablet (450 mg total) by mouth at bedtime. Patient taking differently: Take 450 mg by mouth 2 (two) times daily.  10/09/19  Yes [name], PMHNP  loperamide (IMODIUM A-D) 2 MG tablet Take 2 mg by mouth 2 (two) times daily as needed for diarrhea or loose stools.   Yes [provider]  losartan (COZAAR) 25 MG tablet Take 12.5 mg by mouth daily. 08/31/19  Yes [provider]  Melatonin 10 MG TBCR Take 10 mg by mouth at bedtime.   Yes [provider]  memantine (NAMENDA) 10 MG tablet TAKE 1 TABLET(10 MG) BY MOUTH EVERY EVENING Patient taking differently: Take 10 mg by mouth daily.  10/09/19  Yes [name], PMHNP  methocarbamol (ROBAXIN) 500 MG tablet Take 1 tablet (500 mg total) by mouth every 6 (six) hours as needed for muscle spasms. 02/08/19  Yes Babish, Rodman Key, PA-C  nitroGLYCERIN (NITROSTAT) 0.4 MG SL tablet Place 1 tablet (0.4 mg total) under the tongue every 5 (five) minutes as needed for chest pain. 09/14/17  Yes Rosita Fire, Brittainy M, PA-C  ondansetron (ZOFRAN) 4 MG tablet Take 4 mg by mouth every 8 (eight) hours as needed for nausea or vomiting.  11/26/19  Yes [provider]  oxyCODONE-acetaminophen (PERCOCET/ROXICET) 5-325 MG tablet Take 1 tablet by mouth every 4 (four) hours as needed for severe pain. 11/25/19  Yes Tegeler, Gwenyth Allegra, MD  OZEMPIC, 1 MG/DOSE, 4 MG/3ML SOPN ADMINISTER 1 MG UNDER THE SKIN 1 TIME A WEEK Patient taking differently: Inject 1 mg into the skin once a week. Take on Saturday 12/06/19  Yes Elayne Snare, MD  QUEtiapine (SEROQUEL) 200 MG tablet TAKE 1 TAB PO q 5 pm and 1 tab po QHS Patient taking differently: Take 200 mg by mouth See admin instructions. 200mg  q 5 pm and 200mg  po QHS 10/09/19  Yes [name], PMHNP  rOPINIRole (REQUIP) 0.5 MG tablet Takes 4 tabs at 5 pm and 3 tabs at bedtime and 1-2 tabs prn Patient taking differently: Take 2 mg by mouth in the morning and at bedtime. Takes 4 tablets (2mg ) at 5pm & Take 4 tablets (2 mg) at bedtime 10/09/19  Yes [name], PMHNP  BD PEN NEEDLE NANO 2ND GEN 32G X 4 MM MISC USE TO INJECT 5 TIMES DAILY AS DIRECTED 10/17/19   Elayne Snare, MD  glucose blood (CONTOUR NEXT TEST) test strip Use to test blood sugar 2 times daily 04/28/18   Elayne Snare, MD  Microlet Lancets MISC USE DAILY AS DIRECTED 10/24/18   Elayne Snare, MD    Physical Exam: Vitals:   12/08/19 1328 12/08/19 1647 12/08/19 1745 12/08/19 2048  BP:  (!) 155/80 (!) 146/67 (!) 159/72  Pulse:  87 70 70  Resp:  20 13 18   Temp:      TempSrc:      SpO2:  99% 96% 96%  Weight: 81.2 kg     Height: 5\' 2"  (1.575 m)       Constitutional: NAD, calm, comfortable Eyes: PERRL, lids and conjunctivae normal ENMT: Mucous membranes are moist. Posterior pharynx clear of any exudate or lesions.Normal dentition.  Neck: normal, supple, no masses, no thyromegaly Respiratory: clear to auscultation bilaterally, no wheezing, no crackles. Normal respiratory  effort. No accessory muscle use.  Cardiovascular: Regular rate and rhythm, no murmurs / rubs / gallops. No extremity edema. 2+ pedal pulses. No carotid bruits.  Abdomen: no  tenderness, no masses palpated. No hepatosplenomegaly. Bowel sounds positive.  Musculoskeletal: no clubbing / cyanosis. No joint deformity upper and lower extremities. Good ROM, no contractures. Normal muscle tone.  Skin: no rashes, lesions, ulcers. No induration Neurologic: CN 2-12 grossly intact. Sensation intact, DTR normal. Strength 5/5 in all 4.  Psychiatric: Normal judgment and insight. Alert and oriented x 3. Normal mood.    Labs on Admission: I have personally reviewed following labs and imaging studies  CBC: Recent Labs  Lab 12/08/19 1333  WBC 10.3  HGB 15.3*  HCT 47.7*  MCV 92.6  PLT 500   Basic Metabolic Panel: Recent Labs  Lab 12/04/19 1019 12/08/19 1333  NA 138 137  K 4.7 4.6  CL 104 104  CO2 26 22  GLUCOSE 154* 138*  BUN 9 12  CREATININE 0.92 0.81  CALCIUM 10.1 10.3   GFR: Estimated Creatinine Clearance: 62.9 mL/min (by C-G formula based on SCr of 0.81 mg/dL). Liver Function Tests: Recent Labs  Lab 12/08/19 1706  AST 17  ALT 16  ALKPHOS 78  BILITOT 0.6  PROT 7.0  ALBUMIN 4.1   Recent Labs  Lab 12/08/19 1706  LIPASE 34   No results for input(s): AMMONIA in the last 168 hours. Coagulation Profile: No results for input(s): INR, PROTIME in the last 168 hours. Cardiac Enzymes: No results for input(s): CKTOTAL, CKMB, CKMBINDEX, TROPONINI in the last 168 hours. BNP (last 3 results) No results for input(s): PROBNP in the last 8760 hours. HbA1C: No results for input(s): HGBA1C in the last 72 hours. CBG: No results for input(s): GLUCAP in the last 168 hours. Lipid Profile: No results for input(s): CHOL, HDL, LDLCALC, TRIG, CHOLHDL, LDLDIRECT in the last 72 hours. Thyroid Function Tests: No results for input(s): TSH, T4TOTAL, FREET4, T3FREE, THYROIDAB in the last 72 hours. Anemia Panel: No results for input(s): VITAMINB12, FOLATE, FERRITIN, TIBC, IRON, RETICCTPCT in the last 72 hours. Urine analysis:    Component Value Date/Time   COLORURINE  STRAW (A) 11/25/2019 1242   APPEARANCEUR CLEAR 11/25/2019 1242   LABSPEC 1.032 (H) 11/25/2019 1242   PHURINE 7.0 11/25/2019 1242   GLUCOSEU NEGATIVE 11/25/2019 1242   GLUCOSEU Negative 09/28/2011 1634   HGBUR NEGATIVE 11/25/2019 1242   BILIRUBINUR NEGATIVE 11/25/2019 1242   BILIRUBINUR neg 09/18/2011 1606   KETONESUR NEGATIVE 11/25/2019 1242   PROTEINUR NEGATIVE 11/25/2019 1242   UROBILINOGEN 0.2 12/30/2014 2000   NITRITE NEGATIVE 11/25/2019 1242   LEUKOCYTESUR NEGATIVE 11/25/2019 1242    Radiological Exams on Admission: DG Chest 2 View  Result Date: 12/08/2019 CLINICAL DATA:  Shortness of breath today, chronic abdominal pain worsening, history of diverticulitis EXAM: CHEST - 2 VIEW COMPARISON:  11/17/2018 FINDINGS: Normal heart size and pulmonary vascularity. Tortuous thoracic aorta. Lungs clear. No pulmonary infiltrate, pleural effusion or pneumothorax. EKG lead projects over RIGHT upper lobe. Levoconvex thoracolumbar scoliosis. IMPRESSION: No acute abnormalities. Electronically Signed   By: Lavonia Dana M.D.   On: 12/08/2019 13:55   CT ABDOMEN PELVIS W CONTRAST  Result Date: 12/08/2019 CLINICAL DATA:  Shortness of breath, lower abdominal pain and history of diverticulitis, pain for 6 days EXAM: CT ABDOMEN AND PELVIS WITH CONTRAST TECHNIQUE: Multidetector CT imaging of the abdomen and pelvis was performed using the standard protocol following bolus administration of intravenous contrast. CONTRAST:  183mL OMNIPAQUE IOHEXOL 300  MG/ML  SOLN COMPARISON:  CT 11/29/2019 FINDINGS: Lower chest: Atelectatic changes in the otherwise clear lung bases. Stable 5 mm previously characterized benign nodule in the posterior aspect left lower lobe (4/16). Normal heart size. No pericardial effusion. Hepatobiliary: Geographic hypoattenuation adjacent the falciform ligament in the porta hepatis/gallbladder fossa compatible with focal fatty infiltration. Otherwise normal liver attenuation for contrast timing.  Smooth liver surface contour. Normal gallbladder and biliary tree. Pancreas: Some stable mild prominence of the pancreatic duct with normal distal tapering. No discernible pancreatic lesion. No peripancreatic inflammation. Spleen: Normal in size. No concerning splenic lesions. Adrenals/Urinary Tract: Normal adrenal glands. Kidneys are normally located with symmetric enhancement and excretion. Unchanged appearance of what is likely a chronic right UPJ obstruction with mild right hydronephrosis. No other urinary tract dilatation. No concerning renal mass or urolithiasis. Urinary bladder is unremarkable though the right lateral wall is partially obscured due to streak artifact from a right hip prosthesis. Stomach/Bowel: Distal esophagus, stomach and duodenal sweep are unremarkable. No small bowel wall thickening or dilatation. No evidence of obstruction. Appendix is not visualized. No focal inflammation the vicinity of the cecum to suggest an occult appendicitis. Proximal colon is fairly unremarkable. There is fairly diffuse segmental thickening of the mid to distal colon is seen from the distal transverse to rectosigmoid. A slightly more irregular region of slightly more asymmetric thickening is seen in the distal transverse colon (5/49, 2/49). Though this may reflect a focal thickening of a proximal colonic diverticulum it is favored to be secondary. Numerous additional colonic diverticula present. No extraluminal gas or fluid collection. No pneumatosis or portal venous gas. Vascular/Lymphatic: No significant vascular findings are present. No enlarged abdominal or pelvic lymph nodes. Reproductive: Status post hysterectomy. No adnexal masses. Other: No abdominal wall hernia or abnormality. No abdominopelvic ascites. Small amount of subcutaneous stranding along the anterior abdominal wall, possibly related to injectable use such as insulin. Musculoskeletal: Prior total right hip arthroplasty in expected alignment. Some  mild degenerative changes throughout the spine, slight dextrocurvature at the mid lumbar levels, apex L3. Additional degenerative change seen in the left hip and bilateral SI joints. No acute or worrisome osseous lesion. IMPRESSION: 1. Fairly diffuse segmental thickening of the mid to distal colon is seen from the distal transverse to rectosigmoid, consistent with colitis, which may be infectious or inflammatory in etiology. No evidence of perforation or abscess formation. 2. A slightly more irregular region of focal asymmetric thickening in the distal transverse colon may inflammation of a broad-based colonic diverticulum though favored to be secondary however recommend correlation with colonoscopy on an outpatient basis to exclude underlying lesion. 3. Chronic right UPJ obstruction with mild right hydronephrosis. 4. Small amount of subcutaneous stranding along the anterior abdominal wall, possibly related to injectable use such as insulin. 5. Stable 5 mm solid nodule in the posterior aspect left lower lobe. Unchanged and previously characterized as benign. 6. Aortic Atherosclerosis (ICD10-I70.0). Electronically Signed   By: Lovena Le M.D.   On: 12/08/2019 19:57    EKG: Independently reviewed.  Assessment/Plan Principal Problem:   Colitis Active Problems:   Bipolar 1 disorder ([name])   Diabetic polyneuropathy associated with type 2 diabetes mellitus ([place])    1. Colitis - 1. Probably from incompletely treated diverticulitis earlier this month 2. Rocephin, flagyl 3. IVF: NS at 100 4. Morphine and zofran PRN 2. DM2 - 1. Cont ozempic 2. Lantus 30u QHS (takes 50 at baseline) 3. Mod scale SSI AC 3. BPD1 - 1. Cont home meds  DVT prophylaxis: Lovenox Code Status: Full Family Communication: No family in room Disposition Plan: Home after abd pain improved Consults called: None Admission status: Place in obs   Halei Hanover, [place] Hospital[facility]  How to contact the [facility] Attending or  Consulting provider [place] or covering provider during after hours [place], for this patient?  1. Check the care team in [facility] and look for a) attending/consulting [facility] provider listed and b) the [facility] team listed 2. Log into [email or web address]  Amion Physician Scheduling and messaging for groups and whole hospitals  On call and physician scheduling software for group practices, residents, hospitalists and other medical providers for call, clinic, rotation and shift schedules. On[employer] is a hospital-wide system for scheduling doctors and paging doctors on call. EasyPlot is for scientific plotting and data analysis.  [email or web address]  and use [place]'s universal password to access. If you do not have the password, please contact the hospital operator.  3. Locate the [facility] provider you are looking for under [facility] and page to a number that you can be directly reached. 4. If you still have difficulty reaching the provider, please page the [facility] (Director on Call) for the Hospitalists listed on amion for assistance.  12/08/2019, 9:47 PM

## 2019-12-09 DIAGNOSIS — K760 Fatty (change of) liver, not elsewhere classified: Secondary | ICD-10-CM | POA: Diagnosis present

## 2019-12-09 DIAGNOSIS — K529 Noninfective gastroenteritis and colitis, unspecified: Principal | ICD-10-CM

## 2019-12-09 DIAGNOSIS — F319 Bipolar disorder, unspecified: Secondary | ICD-10-CM | POA: Diagnosis not present

## 2019-12-09 DIAGNOSIS — L930 Discoid lupus erythematosus: Secondary | ICD-10-CM | POA: Diagnosis present

## 2019-12-09 DIAGNOSIS — M19041 Primary osteoarthritis, right hand: Secondary | ICD-10-CM | POA: Diagnosis present

## 2019-12-09 DIAGNOSIS — Z79890 Hormone replacement therapy (postmenopausal): Secondary | ICD-10-CM | POA: Diagnosis not present

## 2019-12-09 DIAGNOSIS — E039 Hypothyroidism, unspecified: Secondary | ICD-10-CM | POA: Diagnosis present

## 2019-12-09 DIAGNOSIS — Z794 Long term (current) use of insulin: Secondary | ICD-10-CM | POA: Diagnosis not present

## 2019-12-09 DIAGNOSIS — M797 Fibromyalgia: Secondary | ICD-10-CM | POA: Diagnosis present

## 2019-12-09 DIAGNOSIS — G8929 Other chronic pain: Secondary | ICD-10-CM | POA: Diagnosis present

## 2019-12-09 DIAGNOSIS — F32A Depression, unspecified: Secondary | ICD-10-CM | POA: Diagnosis present

## 2019-12-09 DIAGNOSIS — Z8701 Personal history of pneumonia (recurrent): Secondary | ICD-10-CM | POA: Diagnosis not present

## 2019-12-09 DIAGNOSIS — I10 Essential (primary) hypertension: Secondary | ICD-10-CM | POA: Diagnosis present

## 2019-12-09 DIAGNOSIS — Z20822 Contact with and (suspected) exposure to covid-19: Secondary | ICD-10-CM | POA: Diagnosis present

## 2019-12-09 DIAGNOSIS — G2581 Restless legs syndrome: Secondary | ICD-10-CM | POA: Diagnosis present

## 2019-12-09 DIAGNOSIS — Z79899 Other long term (current) drug therapy: Secondary | ICD-10-CM | POA: Diagnosis not present

## 2019-12-09 DIAGNOSIS — M160 Bilateral primary osteoarthritis of hip: Secondary | ICD-10-CM | POA: Diagnosis present

## 2019-12-09 DIAGNOSIS — E1142 Type 2 diabetes mellitus with diabetic polyneuropathy: Secondary | ICD-10-CM | POA: Diagnosis present

## 2019-12-09 DIAGNOSIS — F419 Anxiety disorder, unspecified: Secondary | ICD-10-CM | POA: Diagnosis present

## 2019-12-09 DIAGNOSIS — M47812 Spondylosis without myelopathy or radiculopathy, cervical region: Secondary | ICD-10-CM | POA: Diagnosis present

## 2019-12-09 DIAGNOSIS — E785 Hyperlipidemia, unspecified: Secondary | ICD-10-CM | POA: Diagnosis present

## 2019-12-09 DIAGNOSIS — G43909 Migraine, unspecified, not intractable, without status migrainosus: Secondary | ICD-10-CM | POA: Diagnosis present

## 2019-12-09 DIAGNOSIS — M19042 Primary osteoarthritis, left hand: Secondary | ICD-10-CM | POA: Diagnosis present

## 2019-12-09 DIAGNOSIS — H9193 Unspecified hearing loss, bilateral: Secondary | ICD-10-CM | POA: Diagnosis present

## 2019-12-09 LAB — CBC
HCT: 40.8 % (ref 36.0–46.0)
Hemoglobin: 12.7 g/dL (ref 12.0–15.0)
MCH: 29.8 pg (ref 26.0–34.0)
MCHC: 31.1 g/dL (ref 30.0–36.0)
MCV: 95.8 fL (ref 80.0–100.0)
Platelets: 197 10*3/uL (ref 150–400)
RBC: 4.26 MIL/uL (ref 3.87–5.11)
RDW: 13.2 % (ref 11.5–15.5)
WBC: 8.5 10*3/uL (ref 4.0–10.5)
nRBC: 0 % (ref 0.0–0.2)

## 2019-12-09 LAB — BASIC METABOLIC PANEL
Anion gap: 10 (ref 5–15)
BUN: 10 mg/dL (ref 8–23)
CO2: 22 mmol/L (ref 22–32)
Calcium: 9.4 mg/dL (ref 8.9–10.3)
Chloride: 106 mmol/L (ref 98–111)
Creatinine, Ser: 0.74 mg/dL (ref 0.44–1.00)
GFR calc Af Amer: >60 mL/min (ref >60)
GFR calc non Af Amer: >60 mL/min (ref >60)
Glucose, Bld: 120 mg/dL — ABNORMAL HIGH (ref 70–99)
Potassium: 3.8 mmol/L (ref 3.5–5.1)
Sodium: 138 mmol/L (ref 135–145)

## 2019-12-09 LAB — GLUCOSE, CAPILLARY
Glucose-Capillary: 107 mg/dL — ABNORMAL HIGH (ref 70–99)
Glucose-Capillary: 126 mg/dL — ABNORMAL HIGH (ref 70–99)
Glucose-Capillary: 108 mg/dL — ABNORMAL HIGH (ref 70–99)

## 2019-12-09 LAB — RESPIRATORY PANEL BY RT PCR (FLU A&B, COVID)
Influenza A by PCR: NEGATIVE
Influenza B by PCR: NEGATIVE
SARS Coronavirus 2 by RT PCR: NEGATIVE

## 2019-12-09 NOTE — Progress Notes (Signed)
PROGRESS NOTE    Shritha Bresee   MWU:[id number]  DOB: June 08, 1945  DOA: 12/08/2019     0  PCP: London Pepper, MD  CC: abdominal pain  Hospital Course: Ms. Mckinney is a 74 yo CF with PMH diverticulosis with diverticulitis, hypertension, hyperlipidemia, type 2 diabetes, osteoarthritis, depression/anxiety, type I bipolar disorder, migraines, hypothyroidism who presented to the hospital with recurrent abdominal pain.  She had recently been on antibiotics for an outpatient treatment of diverticulitis.  She states that she took approximately 4 days out of a 10-day course and then had undergone repeat CT abdomen/pelvis which showed no further signs of diverticulitis and she was told to stop her antibiotics.  She then redeveloped abdominal pain located across her lower abdomen bilaterally.  Some nausea but no vomiting.  Stools have also been loose.  She underwent repeat CT abdomen/pelvis on admission which showed fairly diffuse segmental thickening of the mid to distal colon from the distal transverse to rectosigmoid consistent with colitis but no obvious signs of diverticulitis.  There was also some focal thickening in the distal transverse colon which may need further repeat evaluation outpatient after her episode is improved. She was started on Rocephin and Flagyl and admitted for further work-up and monitoring.   Interval History:  Patient admitted overnight with recurrent abdominal pain at home after stopping her recent antibiotic course early.  She was being treated for diverticulitis and had improvement on CT scan, at which time antibiotics were discontinued.  She endorses some nausea this morning but no vomiting.  Her husband is bedside.  We discussed the plan for continuing fluids, antibiotics, and pain/nausea control.  Old records reviewed in assessment of this patient  ROS: Constitutional: negative for chills and fevers, Respiratory: negative for cough, Cardiovascular: negative for chest  pain and Gastrointestinal: positive for abdominal pain  Assessment & Plan: * Colitis - suspected due to incomplete abx course; colitis is diffuse and not focal as expected for diverticulitis; for now monitor response to IV abx and if improves will convert to PO and finish course; if no improvement may need to consider further workup - continue CTX, flagyl -Continue LR - Continue morphine for pain and Zofran for nausea control  Diabetic polyneuropathy associated with type 2 diabetes mellitus ([place]) -Continue gabapentin  Bipolar 1 disorder ([place]) - continue home meds  Diabetes mellitus with diabetic neuropathy, without long-term current use of insulin ([place]) - continue current regimen - follow CBGs    Antimicrobials: Rocephin 12/08/2019>> present Flagyl 12/08/2019>> present  DVT prophylaxis: Lovenox Code Status: Full Family Communication: Husband bedside Disposition Plan: Status is: Observation  The patient remains OBS appropriate and will d/c before 2 midnights.  Dispo: The patient is from: Home              Anticipated d/c is to: Home              Anticipated d/c date is: 1 day              Patient currently is not medically stable to d/c.       Objective: Blood pressure 124/63, pulse 72, temperature 98.2 F (36.8 C), temperature source Oral, resp. rate 15, height 5\' 2"  (1.575 m), weight 81.6 kg, SpO2 94 %.  Examination: General appearance: alert, cooperative and no distress Head: Normocephalic, without obvious abnormality, atraumatic Eyes: EOMI Lungs: clear to auscultation bilaterally Heart: regular rate and rhythm and S1, S2 normal Abdomen: obese, soft, minimal TTP in lower quadrants with no R/G. BS present  Extremities: no edema Skin: mobility and turgor normal Neurologic: Grossly normal  Consultants:   none  Procedures:   none  Data Reviewed: I have personally reviewed following labs and imaging studies Results for orders placed or performed during the  hospital encounter of 12/08/19 (from the past 24 hour(s))  Lithium level     Status: Abnormal   Collection Time: 12/08/19  4:58 PM  Result Value Ref Range   Lithium Lvl 0.31 (L) 0.60 - 1.20 mmol/L  Hepatic function panel     Status: None   Collection Time: 12/08/19  5:06 PM  Result Value Ref Range   Total Protein 7.0 6.5 - 8.1 g/dL   Albumin 4.1 3.5 - 5.0 g/dL   AST 17 15 - 41 U/L   ALT 16 0 - 44 U/L   Alkaline Phosphatase 78 38 - 126 U/L   Total Bilirubin 0.6 0.3 - 1.2 mg/dL   Bilirubin, Direct 0.1 0.0 - 0.2 mg/dL   Indirect Bilirubin 0.5 0.3 - 0.9 mg/dL  Lipase, blood     Status: None   Collection Time: 12/08/19  5:06 PM  Result Value Ref Range   Lipase 34 11 - 51 U/L  Urinalysis, Routine w reflex microscopic Urine, Clean Catch     Status: Abnormal   Collection Time: 12/08/19  9:17 PM  Result Value Ref Range   Color, Urine STRAW (A) YELLOW   APPearance CLEAR CLEAR   Specific Gravity, Urine 1.014 1.005 - 1.030   pH 6.0 5.0 - 8.0   Glucose, UA NEGATIVE NEGATIVE mg/dL   Hgb urine dipstick NEGATIVE NEGATIVE   Bilirubin Urine NEGATIVE NEGATIVE   Ketones, ur NEGATIVE NEGATIVE mg/dL   Protein, ur NEGATIVE NEGATIVE mg/dL   Nitrite NEGATIVE NEGATIVE   Leukocytes,Ua NEGATIVE NEGATIVE  Respiratory Panel by RT PCR (Flu A&B, Covid) - Nasopharyngeal Swab     Status: None   Collection Time: 12/08/19 10:45 PM   Specimen: Nasopharyngeal Swab  Result Value Ref Range   SARS Coronavirus 2 by RT PCR NEGATIVE NEGATIVE   Influenza A by PCR NEGATIVE NEGATIVE   Influenza B by PCR NEGATIVE NEGATIVE  CBG monitoring, ED     Status: None   Collection Time: 12/08/19 11:14 PM  Result Value Ref Range   Glucose-Capillary 88 70 - 99 mg/dL   Comment 1 Notify RN   CBC     Status: None   Collection Time: 12/09/19  6:33 AM  Result Value Ref Range   WBC 8.5 4.0 - 10.5 K/uL   RBC 4.26 3.87 - 5.11 MIL/uL   Hemoglobin 12.7 12.0 - 15.0 g/dL   HCT 40.8 36 - 46 %   MCV 95.8 80.0 - 100.0 fL   MCH 29.8 26.0  - 34.0 pg   MCHC 31.1 30.0 - 36.0 g/dL   RDW 13.2 11.5 - 15.5 %   Platelets 197 150 - 400 K/uL   nRBC 0.0 0.0 - 0.2 %  Basic metabolic panel     Status: Abnormal   Collection Time: 12/09/19  6:33 AM  Result Value Ref Range   Sodium 138 135 - 145 mmol/L   Potassium 3.8 3.5 - 5.1 mmol/L   Chloride 106 98 - 111 mmol/L   CO2 22 22 - 32 mmol/L   Glucose, Bld 120 (H) 70 - 99 mg/dL   BUN 10 8 - 23 mg/dL   Creatinine, Ser 0.74 0.44 - 1.00 mg/dL   Calcium 9.4 8.9 - 10.3 mg/dL   GFR calc non  Af Amer >60 >60 mL/min   GFR calc Af Amer >60 >60 mL/min   Anion gap 10 5 - 15  Glucose, capillary     Status: Abnormal   Collection Time: 12/09/19  7:57 AM  Result Value Ref Range   Glucose-Capillary 107 (H) 70 - 99 mg/dL  Glucose, capillary     Status: Abnormal   Collection Time: 12/09/19 11:43 AM  Result Value Ref Range   Glucose-Capillary 126 (H) 70 - 99 mg/dL    Recent Results (from the past 240 hour(s))  Respiratory Panel by RT PCR (Flu A&B, Covid) - Nasopharyngeal Swab     Status: None   Collection Time: 12/08/19 10:45 PM   Specimen: Nasopharyngeal Swab  Result Value Ref Range Status   SARS Coronavirus 2 by RT PCR NEGATIVE NEGATIVE Final    Comment: (NOTE) SARS-CoV-2 target nucleic acids are NOT DETECTED.  The SARS-CoV-2 RNA is generally detectable in upper respiratoy specimens during the acute phase of infection. The lowest concentration of SARS-CoV-2 viral copies this assay can detect is 131 copies/mL. A negative result does not preclude SARS-Cov-2 infection and should not be used as the sole basis for treatment or other patient management decisions. A negative result may occur with  improper specimen collection/handling, submission of specimen other than nasopharyngeal swab, presence of viral mutation(s) within the areas targeted by this assay, and inadequate number of viral copies (<131 copies/mL). A negative result must be combined with clinical observations, patient history, and  epidemiological information. The expected result is Negative.  Fact Sheet for Patients:  PinkCheek.be  Fact Sheet for Healthcare Providers:  GravelBags.it  This test is no t yet approved or cleared by the [place] FDA and  has been authorized for detection and/or diagnosis of SARS-CoV-2 by FDA under an Emergency Use Authorization (EUA). This EUA will remain  in effect (meaning this test can be used) for the duration of the COVID-19 declaration under Section 564(b)(1) of the Act, 21 U.S.C. section 360bbb-3(b)(1), unless the authorization is terminated or revoked sooner.     Influenza A by PCR NEGATIVE NEGATIVE Final   Influenza B by PCR NEGATIVE NEGATIVE Final    Comment: (NOTE) The Xpert Xpress SARS-CoV-2/FLU/RSV assay is intended as an aid in  the diagnosis of influenza from Nasopharyngeal swab specimens and  should not be used as a sole basis for treatment. Nasal washings and  aspirates are unacceptable for Xpert Xpress SARS-CoV-2/FLU/RSV  testing.  Fact Sheet for Patients: PinkCheek.be  Fact Sheet for Healthcare Providers: GravelBags.it  This test is not yet approved or cleared by the [place] FDA and  has been authorized for detection and/or diagnosis of SARS-CoV-2 by  FDA under an Emergency Use Authorization (EUA). This EUA will remain  in effect (meaning this test can be used) for the duration of the  Covid-19 declaration under Section 564(b)(1) of the Act, 21  U.S.C. section 360bbb-3(b)(1), unless the authorization is  terminated or revoked. Performed at [facility], [place] [address] [place], [place] [address]      Radiology Studies: DG Chest 2 View  Result Date: 12/08/2019 CLINICAL DATA:  Shortness of breath today, chronic abdominal pain worsening, history of diverticulitis EXAM: CHEST - 2 VIEW COMPARISON:  11/17/2018  FINDINGS: Normal heart size and pulmonary vascularity. Tortuous thoracic aorta. Lungs clear. No pulmonary infiltrate, pleural effusion or pneumothorax. EKG lead projects over RIGHT upper lobe. Levoconvex thoracolumbar scoliosis. IMPRESSION: No acute abnormalities. Electronically Signed   By: Crist Infante.D.  On: 12/08/2019 13:55   CT ABDOMEN PELVIS W CONTRAST  Result Date: 12/08/2019 CLINICAL DATA:  Shortness of breath, lower abdominal pain and history of diverticulitis, pain for 6 days EXAM: CT ABDOMEN AND PELVIS WITH CONTRAST TECHNIQUE: Multidetector CT imaging of the abdomen and pelvis was performed using the standard protocol following bolus administration of intravenous contrast. CONTRAST:  173mL OMNIPAQUE IOHEXOL 300 MG/ML  SOLN COMPARISON:  CT 11/29/2019 FINDINGS: Lower chest: Atelectatic changes in the otherwise clear lung bases. Stable 5 mm previously characterized benign nodule in the posterior aspect left lower lobe (4/16). Normal heart size. No pericardial effusion. Hepatobiliary: Geographic hypoattenuation adjacent the falciform ligament in the porta hepatis/gallbladder fossa compatible with focal fatty infiltration. Otherwise normal liver attenuation for contrast timing. Smooth liver surface contour. Normal gallbladder and biliary tree. Pancreas: Some stable mild prominence of the pancreatic duct with normal distal tapering. No discernible pancreatic lesion. No peripancreatic inflammation. Spleen: Normal in size. No concerning splenic lesions. Adrenals/Urinary Tract: Normal adrenal glands. Kidneys are normally located with symmetric enhancement and excretion. Unchanged appearance of what is likely a chronic right UPJ obstruction with mild right hydronephrosis. No other urinary tract dilatation. No concerning renal mass or urolithiasis. Urinary bladder is unremarkable though the right lateral wall is partially obscured due to streak artifact from a right hip prosthesis. Stomach/Bowel: Distal  esophagus, stomach and duodenal sweep are unremarkable. No small bowel wall thickening or dilatation. No evidence of obstruction. Appendix is not visualized. No focal inflammation the vicinity of the cecum to suggest an occult appendicitis. Proximal colon is fairly unremarkable. There is fairly diffuse segmental thickening of the mid to distal colon is seen from the distal transverse to rectosigmoid. A slightly more irregular region of slightly more asymmetric thickening is seen in the distal transverse colon (5/49, 2/49). Though this may reflect a focal thickening of a proximal colonic diverticulum it is favored to be secondary. Numerous additional colonic diverticula present. No extraluminal gas or fluid collection. No pneumatosis or portal venous gas. Vascular/Lymphatic: No significant vascular findings are present. No enlarged abdominal or pelvic lymph nodes. Reproductive: Status post hysterectomy. No adnexal masses. Other: No abdominal wall hernia or abnormality. No abdominopelvic ascites. Small amount of subcutaneous stranding along the anterior abdominal wall, possibly related to injectable use such as insulin. Musculoskeletal: Prior total right hip arthroplasty in expected alignment. Some mild degenerative changes throughout the spine, slight dextrocurvature at the mid lumbar levels, apex L3. Additional degenerative change seen in the left hip and bilateral SI joints. No acute or worrisome osseous lesion. IMPRESSION: 1. Fairly diffuse segmental thickening of the mid to distal colon is seen from the distal transverse to rectosigmoid, consistent with colitis, which may be infectious or inflammatory in etiology. No evidence of perforation or abscess formation. 2. A slightly more irregular region of focal asymmetric thickening in the distal transverse colon may inflammation of a broad-based colonic diverticulum though favored to be secondary however recommend correlation with colonoscopy on an outpatient basis  to exclude underlying lesion. 3. Chronic right UPJ obstruction with mild right hydronephrosis. 4. Small amount of subcutaneous stranding along the anterior abdominal wall, possibly related to injectable use such as insulin. 5. Stable 5 mm solid nodule in the posterior aspect left lower lobe. Unchanged and previously characterized as benign. 6. Aortic Atherosclerosis (ICD10-I70.0). Electronically Signed   By: Lovena Le M.D.   On: 12/08/2019 19:57   CT ABDOMEN PELVIS W CONTRAST  Final Result    DG Chest 2 View  Final Result  Scheduled Meds: . atorvastatin  40 mg Oral QHS  . celecoxib  200 mg Oral QHS  . divalproex  500 mg Oral QHS  . donepezil  10 mg Oral QHS  . enoxaparin (LOVENOX) injection  40 mg Subcutaneous Q24H  . gabapentin  600 mg Oral TID  . insulin aspart  0-15 Units Subcutaneous TID WC  . insulin glargine  30 Units Subcutaneous QHS  . levothyroxine  75 mcg Oral QAC breakfast  . lithium carbonate  450 mg Oral BID  . melatonin  10 mg Oral QHS  . memantine  10 mg Oral QHS  . pantoprazole  80 mg Oral Q1200  . QUEtiapine  200 mg Oral BID  . rOPINIRole  2 mg Oral BID  . Semaglutide (1 MG/DOSE)  1 mg Subcutaneous Weekly   PRN Meds: acetaminophen **OR** acetaminophen, ALPRAZolam, hyoscyamine, methocarbamol, morphine injection, ondansetron **OR** ondansetron (ZOFRAN) IV Continuous Infusions: . cefTRIAXone (ROCEPHIN)  IV    . lactated ringers 100 mL/hr at 12/09/19 1222  . metronidazole        LOS: 0 days  Time spent: Greater than 50% of the 35 minute visit was spent in counseling/coordination of care for the patient as laid out in the A&P.   Dwyane Dee, MD [facility] 12/09/2019, 1:45 PM  Contact via secure chat.  To contact the attending provider between 7A-7P or the covering provider during after hours 7P-7A, please log into the web site [email or web address] and access using universal [place] password for that web site. If you do not have the password,  please call the hospital operator.

## 2019-12-09 NOTE — Hospital Course (Addendum)
[name] [name] is a 74 yo CF with PMH diverticulosis with diverticulitis, hypertension, hyperlipidemia, type 2 diabetes, osteoarthritis, depression/anxiety, type I bipolar disorder, migraines, hypothyroidism who presented to the hospital with recurrent abdominal pain.  She had recently been on antibiotics for an outpatient treatment of diverticulitis.  She states that she took approximately 4 days out of a 10-day course and then had undergone repeat CT abdomen/pelvis which showed no further signs of diverticulitis and she was told to stop her antibiotics.  She then redeveloped abdominal pain located across her lower abdomen bilaterally.  Some nausea but no vomiting.  Stools have also been loose.  She underwent repeat CT abdomen/pelvis on admission which showed fairly diffuse segmental thickening of the mid to distal colon from the distal transverse to rectosigmoid consistent with colitis but no obvious signs of diverticulitis.  There was also some focal thickening in the distal transverse colon which may need further repeat evaluation outpatient after her episode is improved. She was started on Rocephin and Flagyl and admitted for further work-up and monitoring. She responded fairly well to treatment.  She had ongoing but improving lower abdominal pain.  Stools started to take on more form.  She had no significant nausea or vomiting, just mildly lingering nausea.  CT scan results were discussed with patient and her husband prior to discharge.  She was told recommendation is for outpatient colonoscopy for follow-up evaluation.  She was given a prescription for Augmentin to complete course at discharge.

## 2019-12-09 NOTE — Plan of Care (Signed)
  Problem: Education: Goal: Knowledge of General Education information will improve Description Including pain rating scale, medication(s)/side effects and non-pharmacologic comfort measures Outcome: Progressing   

## 2019-12-09 NOTE — Assessment & Plan Note (Signed)
-   continue home meds 

## 2019-12-09 NOTE — Assessment & Plan Note (Addendum)
-   suspected due to incomplete abx course; colitis is diffuse and not focal as expected for diverticulitis; for now monitor response to IV abx and if improves will convert to PO and finish course -CT findings discussed.  Outpatient follow-up with GI for colonoscopy -Antibiotics transitioned to Augmentin at discharge to complete total of 2-week course

## 2019-12-09 NOTE — Assessment & Plan Note (Signed)
Continue gabapentin.

## 2019-12-09 NOTE — Assessment & Plan Note (Signed)
-   continue current regimen - follow CBGs

## 2019-12-10 LAB — CBC WITH DIFFERENTIAL/PLATELET
Abs Immature Granulocytes: 0.03 10*3/uL (ref 0.00–0.07)
Basophils Absolute: 0 10*3/uL (ref 0.0–0.1)
Basophils Relative: 1 %
Eosinophils Absolute: 0.5 10*3/uL (ref 0.0–0.5)
Eosinophils Relative: 8 %
HCT: 43 % (ref 36.0–46.0)
Hemoglobin: 13.3 g/dL (ref 12.0–15.0)
Immature Granulocytes: 1 %
Lymphocytes Relative: 28 %
Lymphs Abs: 1.9 10*3/uL (ref 0.7–4.0)
MCH: 29.4 pg (ref 26.0–34.0)
MCHC: 30.9 g/dL (ref 30.0–36.0)
MCV: 94.9 fL (ref 80.0–100.0)
Monocytes Absolute: 0.5 10*3/uL (ref 0.1–1.0)
Monocytes Relative: 8 %
Neutro Abs: 3.6 10*3/uL (ref 1.7–7.7)
Neutrophils Relative %: 54 %
Platelets: 194 10*3/uL (ref 150–400)
RBC: 4.53 MIL/uL (ref 3.87–5.11)
RDW: 13.1 % (ref 11.5–15.5)
WBC: 6.5 10*3/uL (ref 4.0–10.5)
nRBC: 0 % (ref 0.0–0.2)

## 2019-12-10 LAB — BASIC METABOLIC PANEL
Anion gap: 10 (ref 5–15)
BUN: 11 mg/dL (ref 8–23)
CO2: 22 mmol/L (ref 22–32)
Calcium: 9.6 mg/dL (ref 8.9–10.3)
Chloride: 110 mmol/L (ref 98–111)
Creatinine, Ser: 0.83 mg/dL (ref 0.44–1.00)
GFR calc Af Amer: >60 mL/min (ref >60)
GFR calc non Af Amer: >60 mL/min (ref >60)
Glucose, Bld: 107 mg/dL — ABNORMAL HIGH (ref 70–99)
Potassium: 4.2 mmol/L (ref 3.5–5.1)
Sodium: 142 mmol/L (ref 135–145)

## 2019-12-10 LAB — GLUCOSE, CAPILLARY
Glucose-Capillary: 127 mg/dL — ABNORMAL HIGH (ref 70–99)
Glucose-Capillary: 117 mg/dL — ABNORMAL HIGH (ref 70–99)

## 2019-12-10 LAB — MAGNESIUM: Magnesium: 2.2 mg/dL (ref 1.7–2.4)

## 2019-12-10 MED ORDER — OXYCODONE HCL 5 MG PO TABS
5.0000 mg | ORAL_TABLET | ORAL | 0 refills | Status: DC | PRN
Start: 2019-12-10 — End: 2020-07-11

## 2019-12-10 MED ORDER — AMOXICILLIN-POT CLAVULANATE 875-125 MG PO TABS: 1.0000 | ORAL_TABLET | Freq: Two times a day (BID) | ORAL | Status: DC

## 2019-12-10 MED ORDER — PANTOPRAZOLE SODIUM 40 MG PO TBEC
40.0000 mg | DELAYED_RELEASE_TABLET | Freq: Every day | ORAL | 3 refills | Status: DC
Start: 2019-12-10 — End: 2020-07-11

## 2019-12-10 MED ORDER — AMOXICILLIN-POT CLAVULANATE 875-125 MG PO TABS: 1.0000 | ORAL_TABLET | Freq: Two times a day (BID) | ORAL | 0 refills | Status: AC

## 2019-12-10 MED ORDER — DOCUSATE SODIUM 100 MG PO CAPS: 100.0000 mg | ORAL_CAPSULE | Freq: Every day | ORAL | Status: DC

## 2019-12-10 NOTE — Discharge Summary (Signed)
Physician Discharge Summary  [name] [name] WUJ:[id number] DOB: [birth year] DOA: 12/08/2019  PCP: London Pepper, MD  Admit date: 12/08/2019 Discharge date: 12/10/2019  Admitted From: Home Disposition:  Home Discharging physician: Dwyane Dee, MD  Recommendations for Outpatient Follow-up:  1. Follow-up with GI for outpatient colonoscopy  Patient discharged to home in Discharge Condition: stable CODE STATUS: Full Diet recommendation:  Diet Orders (From admission, onward)    Start     Ordered   12/08/19 2147  Diet Carb Modified Fluid consistency: Thin; Room service appropriate? Yes  Diet effective now       Question Answer Comment  Diet-HS Snack? Nothing   Calorie Level Medium 1600-2000   Fluid consistency: Thin   Room service appropriate? Yes      12/08/19 2146          Hospital Course: Ms. Bensman is a 74 yo CF with PMH diverticulosis with diverticulitis, hypertension, hyperlipidemia, type 2 diabetes, osteoarthritis, depression/anxiety, type I bipolar disorder, migraines, hypothyroidism who presented to the hospital with recurrent abdominal pain.  She had recently been on antibiotics for an outpatient treatment of diverticulitis.  She states that she took approximately 4 days out of a 10-day course and then had undergone repeat CT abdomen/pelvis which showed no further signs of diverticulitis and she was told to stop her antibiotics.  She then redeveloped abdominal pain located across her lower abdomen bilaterally.  Some nausea but no vomiting.  Stools have also been loose.  She underwent repeat CT abdomen/pelvis on admission which showed fairly diffuse segmental thickening of the mid to distal colon from the distal transverse to rectosigmoid consistent with colitis but no obvious signs of diverticulitis.  There was also some focal thickening in the distal transverse colon which may need further repeat evaluation outpatient after her episode is improved. She was started on Rocephin  and Flagyl and admitted for further work-up and monitoring. She responded fairly well to treatment.  She had ongoing but improving lower abdominal pain.  Stools started to take on more form.  She had no significant nausea or vomiting, just mildly lingering nausea.  CT scan results were discussed with patient and her husband prior to discharge.  She was told recommendation is for outpatient colonoscopy for follow-up evaluation.  She was given a prescription for Augmentin to complete course at discharge.    * Colitis - suspected due to incomplete abx course; colitis is diffuse and not focal as expected for diverticulitis; for now monitor response to IV abx and if improves will convert to PO and finish course -CT findings discussed.  Outpatient follow-up with GI for colonoscopy -Antibiotics transitioned to Augmentin at discharge to complete total of 2-week course  Diabetic polyneuropathy associated with type 2 diabetes mellitus ([place]) -Continue gabapentin  Bipolar 1 disorder ([place]) - continue home meds  Diabetes mellitus with diabetic neuropathy, without long-term current use of insulin ([place]) - continue current regimen - follow CBGs    The patient's chronic medical conditions were treated accordingly per the patient's home medication regimen except as noted.  On day of discharge, patient was felt deemed stable for discharge. Patient/family member advised to call PCP or come back to ER if needed.   Discharge Diagnoses:   Principal Diagnosis: Colitis  Active Hospital Problems   Diagnosis Date Noted  . Colitis 10/28/2015    Priority: High  . Diabetic polyneuropathy associated with type 2 diabetes mellitus ([place]) 09/21/2014  . Bipolar 1 disorder ([place]) 06/18/2012  . Diabetes mellitus with diabetic neuropathy, without  long-term current use of insulin ([place]) 06/18/2012    Resolved Hospital Problems  No resolved problems to display.    Discharge Instructions    Increase activity slowly    Complete by: As directed      Allergies as of 12/10/2019      Reactions   Codeine Anxiety, Other (See Comments)   Hallucinations, tolerates oxycodone    Macrolides And Ketolides Nausea And Vomiting   Procaine Hcl Palpitations   Aspirin Nausea And Vomiting, Other (See Comments)   Reaction:  Burns pts stomach    Benadryl [diphenhydramine] Other (See Comments)   Per MD "inhibits potency of gabapentin, lithium etc"   Diflucan [fluconazole] Other (See Comments)   Unknown reaction   Dilaudid [hydromorphone Hcl] Other (See Comments)   Migraines and nightmares    Sulfa Antibiotics Other (See Comments)   Unknown reaction      Medication List    STOP taking these medications   esomeprazole 40 MG capsule Commonly known as: Kief Replaced by: pantoprazole 40 MG tablet   oxyCODONE-acetaminophen 5-325 MG tablet Commonly known as: PERCOCET/ROXICET     TAKE these medications   acetaminophen 500 MG tablet Commonly known as: TYLENOL Take 1,000 mg by mouth 3 (three) times daily as needed for moderate pain.   ALPRAZolam 0.5 MG tablet Commonly known as: XANAX Take 1 tablet (0.5 mg total) by mouth 4 (four) times daily as needed for anxiety.   amoxicillin-clavulanate 875-125 MG tablet Commonly known as: Augmentin Take 1 tablet by mouth every 12 (twelve) hours for 12 days.   atorvastatin 40 MG tablet Commonly known as: LIPITOR Take 40 mg by mouth at bedtime.   BD Pen Needle Nano 2nd Gen 32G X 4 MM Misc Generic drug: Insulin Pen Needle USE TO INJECT 5 TIMES DAILY AS DIRECTED   celecoxib 200 MG capsule Commonly known as: CELEBREX Take 200 mg by mouth at bedtime.   divalproex 250 MG DR tablet Commonly known as: DEPAKOTE Take 2 tablets (500 mg total) by mouth at bedtime.   docusate sodium 100 MG capsule Commonly known as: Colace Take 1 capsule (100 mg total) by mouth daily.   donepezil 10 MG tablet Commonly known as: ARICEPT Take 1 tablet (10 mg total) by mouth at bedtime.    gabapentin 600 MG tablet Commonly known as: NEURONTIN Take 600 mg by mouth 3 (three) times daily.   glucose blood test strip Commonly known as: Contour Next Test Use to test blood sugar 2 times daily   hyoscyamine 0.125 MG tablet Commonly known as: LEVSIN Take 0.125 mg by mouth every 4 (four) hours as needed for bladder spasms.   Lantus SoloStar 100 UNIT/ML Solostar Pen Generic drug: insulin glargine Inject 54 units under the skin once daily. What changed:   how much to take  how to take this  when to take this  additional instructions   levothyroxine 75 MCG tablet Commonly known as: SYNTHROID TAKE 1 TABLET BY MOUTH DAILY BEFORE BREAKFAST What changed: See the new instructions.   lithium carbonate 450 MG CR tablet Commonly known as: ESKALITH Take 1 tablet (450 mg total) by mouth at bedtime. What changed: when to take this   loperamide 2 MG tablet Commonly known as: IMODIUM A-D Take 2 mg by mouth 2 (two) times daily as needed for diarrhea or loose stools.   losartan 25 MG tablet Commonly known as: COZAAR Take 12.5 mg by mouth daily.   Melatonin 10 MG Tbcr Take 10 mg by mouth at  bedtime.   memantine 10 MG tablet Commonly known as: NAMENDA TAKE 1 TABLET(10 MG) BY MOUTH EVERY EVENING What changed:   how much to take  how to take this  when to take this  additional instructions   methocarbamol 500 MG tablet Commonly known as: Robaxin Take 1 tablet (500 mg total) by mouth every 6 (six) hours as needed for muscle spasms.   Microlet Lancets Misc USE DAILY AS DIRECTED   nitroGLYCERIN 0.4 MG SL tablet Commonly known as: NITROSTAT Place 1 tablet (0.4 mg total) under the tongue every 5 (five) minutes as needed for chest pain.   ondansetron 4 MG tablet Commonly known as: ZOFRAN Take 4 mg by mouth every 8 (eight) hours as needed for nausea or vomiting.   oxyCODONE 5 MG immediate release tablet Commonly known as: Oxy IR/ROXICODONE Take 1 tablet (5 mg  total) by mouth every 4 (four) hours as needed for severe pain.   Ozempic (1 MG/DOSE) 4 MG/3ML Sopn Generic drug: Semaglutide (1 MG/DOSE) ADMINISTER 1 MG UNDER THE SKIN 1 TIME A WEEK What changed: See the new instructions.   pantoprazole 40 MG tablet Commonly known as: PROTONIX Take 1 tablet (40 mg total) by mouth daily. Replaces: esomeprazole 40 MG capsule   QUEtiapine 200 MG tablet Commonly known as: SEROQUEL TAKE 1 TAB PO q 5 pm and 1 tab po QHS What changed:   how much to take  how to take this  when to take this  additional instructions   rOPINIRole 0.5 MG tablet Commonly known as: REQUIP Takes 4 tabs at 5 pm and 3 tabs at bedtime and 1-2 tabs prn What changed:   how much to take  how to take this  when to take this  additional instructions   VITAMIN B 12 PO Take 1 tablet by mouth daily.   Vitamin D 50 MCG (2000 UT) tablet Take 2,000 Units by mouth daily.       Follow-up Information    Ronnette Juniper, MD. Schedule an appointment as soon as possible for a visit in 4 week(s).   Specialty: Gastroenterology Contact information: [address] [place] [address] [phone]              Allergies  Allergen Reactions  . Codeine Anxiety and Other (See Comments)    Hallucinations, tolerates oxycodone   . Macrolides And Ketolides Nausea And Vomiting  . Procaine Hcl Palpitations  . Aspirin Nausea And Vomiting and Other (See Comments)    Reaction:  Burns pts stomach   . Benadryl [Diphenhydramine] Other (See Comments)    Per MD "inhibits potency of gabapentin, lithium etc"  . Diflucan [Fluconazole] Other (See Comments)    Unknown reaction   . Dilaudid [Hydromorphone Hcl] Other (See Comments)    Migraines and nightmares   . Sulfa Antibiotics Other (See Comments)    Unknown reaction    Consultations: None  Discharge Exam: BP 118/85 (BP Location: Right Arm)   Pulse 65   Temp 98.2 F (36.8 C) (Oral)   Resp 15   Ht 5\' 2"  (1.575 m)    Wt 81.6 kg   SpO2 92%   BMI 32.90 kg/m  General appearance: alert, cooperative and no distress Head: Normocephalic, without obvious abnormality, atraumatic Eyes: EOMI Lungs: clear to auscultation bilaterally Heart: regular rate and rhythm and S1, S2 normal Abdomen: obese, soft, minimal TTP in lower quadrants with no R/G. BS present Extremities: no edema Skin: mobility and turgor normal Neurologic: Grossly  normal  The results of significant diagnostics from this hospitalization (including imaging, microbiology, ancillary and laboratory) are listed below for reference.   Microbiology: Recent Results (from the past 240 hour(s))  Respiratory Panel by RT PCR (Flu A&B, Covid) - Nasopharyngeal Swab     Status: None   Collection Time: 12/08/19 10:45 PM   Specimen: Nasopharyngeal Swab  Result Value Ref Range Status   SARS Coronavirus 2 by RT PCR NEGATIVE NEGATIVE Final    Comment: (NOTE) SARS-CoV-2 target nucleic acids are NOT DETECTED.  The SARS-CoV-2 RNA is generally detectable in upper respiratoy specimens during the acute phase of infection. The lowest concentration of SARS-CoV-2 viral copies this assay can detect is 131 copies/mL. A negative result does not preclude SARS-Cov-2 infection and should not be used as the sole basis for treatment or other patient management decisions. A negative result may occur with  improper specimen collection/handling, submission of specimen other than nasopharyngeal swab, presence of viral mutation(s) within the areas targeted by this assay, and inadequate number of viral copies (<131 copies/mL). A negative result must be combined with clinical observations, patient history, and epidemiological information. The expected result is Negative.  Fact Sheet for Patients:  PinkCheek.be  Fact Sheet for Healthcare Providers:  GravelBags.it  This test is no t yet approved or cleared by the  [place] FDA and  has been authorized for detection and/or diagnosis of SARS-CoV-2 by FDA under an Emergency Use Authorization (EUA). This EUA will remain  in effect (meaning this test can be used) for the duration of the COVID-19 declaration under Section 564(b)(1) of the Act, 21 U.S.C. section 360bbb-3(b)(1), unless the authorization is terminated or revoked sooner.     Influenza A by PCR NEGATIVE NEGATIVE Final   Influenza B by PCR NEGATIVE NEGATIVE Final    Comment: (NOTE) The Xpert Xpress SARS-CoV-2/FLU/RSV assay is intended as an aid in  the diagnosis of influenza from Nasopharyngeal swab specimens and  should not be used as a sole basis for treatment. Nasal washings and  aspirates are unacceptable for Xpert Xpress SARS-CoV-2/FLU/RSV  testing.  Fact Sheet for Patients: PinkCheek.be  Fact Sheet for Healthcare Providers: GravelBags.it  This test is not yet approved or cleared by the [place] FDA and  has been authorized for detection and/or diagnosis of SARS-CoV-2 by  FDA under an Emergency Use Authorization (EUA). This EUA will remain  in effect (meaning this test can be used) for the duration of the  Covid-19 declaration under Section 564(b)(1) of the Act, 21  U.S.C. section 360bbb-3(b)(1), unless the authorization is  terminated or revoked. Performed at [facility], [place] [address], [place], [place] [address]      Labs: BNP (last 3 results) No results for input(s): BNP in the last 8760 hours. Basic Metabolic Panel: Recent Labs  Lab 12/04/19 1019 12/08/19 1333 12/09/19 0633 12/10/19 0657  NA 138 137 138 142  K 4.7 4.6 3.8 4.2  CL 104 104 106 110  CO2 26 22 22 22   GLUCOSE 154* 138* 120* 107*  BUN 9 12 10 11   CREATININE 0.92 0.81 0.74 0.83  CALCIUM 10.1 10.3 9.4 9.6  MG  --   --   --  2.2   Liver Function Tests: Recent Labs  Lab 12/08/19 1706  AST 17  ALT 16  ALKPHOS  78  BILITOT 0.6  PROT 7.0  ALBUMIN 4.1   Recent Labs  Lab 12/08/19 1706  LIPASE 34   No results for input(s):  AMMONIA in the last 168 hours. CBC: Recent Labs  Lab 12/08/19 1333 12/09/19 0633 12/10/19 0657  WBC 10.3 8.5 6.5  NEUTROABS  --   --  3.6  HGB 15.3* 12.7 13.3  HCT 47.7* 40.8 43.0  MCV 92.6 95.8 94.9  PLT 275 197 194   Cardiac Enzymes: No results for input(s): CKTOTAL, CKMB, CKMBINDEX, TROPONINI in the last 168 hours. BNP: Invalid input(s): POCBNP CBG: Recent Labs  Lab 12/09/19 0757 12/09/19 1143 12/09/19 1654 12/10/19 0750 12/10/19 1157  GLUCAP 107* 126* 108* 127* 117*   D-Dimer No results for input(s): DDIMER in the last 72 hours. Hgb A1c No results for input(s): HGBA1C in the last 72 hours. Lipid Profile No results for input(s): CHOL, HDL, LDLCALC, TRIG, CHOLHDL, LDLDIRECT in the last 72 hours. Thyroid function studies No results for input(s): TSH, T4TOTAL, T3FREE, THYROIDAB in the last 72 hours.  Invalid input(s): FREET3 Anemia work up No results for input(s): VITAMINB12, FOLATE, FERRITIN, TIBC, IRON, RETICCTPCT in the last 72 hours. Urinalysis    Component Value Date/Time   COLORURINE STRAW (A) 12/08/2019 2117   APPEARANCEUR CLEAR 12/08/2019 2117   LABSPEC 1.014 12/08/2019 2117   PHURINE 6.0 12/08/2019 2117   GLUCOSEU NEGATIVE 12/08/2019 2117   GLUCOSEU Negative 09/28/2011 1634   HGBUR NEGATIVE 12/08/2019 2117   BILIRUBINUR NEGATIVE 12/08/2019 2117   BILIRUBINUR neg 09/18/2011 1606   KETONESUR NEGATIVE 12/08/2019 2117   PROTEINUR NEGATIVE 12/08/2019 2117   UROBILINOGEN 0.2 12/30/2014 2000   NITRITE NEGATIVE 12/08/2019 2117   LEUKOCYTESUR NEGATIVE 12/08/2019 2117   Sepsis Labs Invalid input(s): PROCALCITONIN,  WBC,  LACTICIDVEN Microbiology Recent Results (from the past 240 hour(s))  Respiratory Panel by RT PCR (Flu A&B, Covid) - Nasopharyngeal Swab     Status: None   Collection Time: 12/08/19 10:45 PM   Specimen: Nasopharyngeal  Swab  Result Value Ref Range Status   SARS Coronavirus 2 by RT PCR NEGATIVE NEGATIVE Final    Comment: (NOTE) SARS-CoV-2 target nucleic acids are NOT DETECTED.  The SARS-CoV-2 RNA is generally detectable in upper respiratoy specimens during the acute phase of infection. The lowest concentration of SARS-CoV-2 viral copies this assay can detect is 131 copies/mL. A negative result does not preclude SARS-Cov-2 infection and should not be used as the sole basis for treatment or other patient management decisions. A negative result may occur with  improper specimen collection/handling, submission of specimen other than nasopharyngeal swab, presence of viral mutation(s) within the areas targeted by this assay, and inadequate number of viral copies (<131 copies/mL). A negative result must be combined with clinical observations, patient history, and epidemiological information. The expected result is Negative.  Fact Sheet for Patients:  PinkCheek.be  Fact Sheet for Healthcare Providers:  GravelBags.it  This test is no t yet approved or cleared by the [place] FDA and  has been authorized for detection and/or diagnosis of SARS-CoV-2 by FDA under an Emergency Use Authorization (EUA). This EUA will remain  in effect (meaning this test can be used) for the duration of the COVID-19 declaration under Section 564(b)(1) of the Act, 21 U.S.C. section 360bbb-3(b)(1), unless the authorization is terminated or revoked sooner.     Influenza A by PCR NEGATIVE NEGATIVE Final   Influenza B by PCR NEGATIVE NEGATIVE Final    Comment: (NOTE) The Xpert Xpress SARS-CoV-2/FLU/RSV assay is intended as an aid in  the diagnosis of influenza from Nasopharyngeal swab specimens and  should not be used as a sole basis for treatment. Nasal  washings and  aspirates are unacceptable for Xpert Xpress SARS-CoV-2/FLU/RSV  testing.  Fact Sheet for  Patients: PinkCheek.be  Fact Sheet for Healthcare Providers: GravelBags.it  This test is not yet approved or cleared by the [place] FDA and  has been authorized for detection and/or diagnosis of SARS-CoV-2 by  FDA under an Emergency Use Authorization (EUA). This EUA will remain  in effect (meaning this test can be used) for the duration of the  Covid-19 declaration under Section 564(b)(1) of the Act, 21  U.S.C. section 360bbb-3(b)(1), unless the authorization is  terminated or revoked. Performed at [facility], [place] [address] [place],  [address]     Procedures/Studies: DG Chest 2 View  Result Date: 12/08/2019 CLINICAL DATA:  Shortness of breath today, chronic abdominal pain worsening, history of diverticulitis EXAM: CHEST - 2 VIEW COMPARISON:  11/17/2018 FINDINGS: Normal heart size and pulmonary vascularity. Tortuous thoracic aorta. Lungs clear. No pulmonary infiltrate, pleural effusion or pneumothorax. EKG lead projects over RIGHT upper lobe. Levoconvex thoracolumbar scoliosis. IMPRESSION: No acute abnormalities. Electronically Signed   By: Lavonia Dana M.D.   On: 12/08/2019 13:55   CT ABDOMEN PELVIS W CONTRAST  Result Date: 12/08/2019 CLINICAL DATA:  Shortness of breath, lower abdominal pain and history of diverticulitis, pain for 6 days EXAM: CT ABDOMEN AND PELVIS WITH CONTRAST TECHNIQUE: Multidetector CT imaging of the abdomen and pelvis was performed using the standard protocol following bolus administration of intravenous contrast. CONTRAST:  129mL OMNIPAQUE IOHEXOL 300 MG/ML  SOLN COMPARISON:  CT 11/29/2019 FINDINGS: Lower chest: Atelectatic changes in the otherwise clear lung bases. Stable 5 mm previously characterized benign nodule in the posterior aspect left lower lobe (4/16). Normal heart size. No pericardial effusion. Hepatobiliary: Geographic hypoattenuation adjacent the falciform ligament  in the porta hepatis/gallbladder fossa compatible with focal fatty infiltration. Otherwise normal liver attenuation for contrast timing. Smooth liver surface contour. Normal gallbladder and biliary tree. Pancreas: Some stable mild prominence of the pancreatic duct with normal distal tapering. No discernible pancreatic lesion. No peripancreatic inflammation. Spleen: Normal in size. No concerning splenic lesions. Adrenals/Urinary Tract: Normal adrenal glands. Kidneys are normally located with symmetric enhancement and excretion. Unchanged appearance of what is likely a chronic right UPJ obstruction with mild right hydronephrosis. No other urinary tract dilatation. No concerning renal mass or urolithiasis. Urinary bladder is unremarkable though the right lateral wall is partially obscured due to streak artifact from a right hip prosthesis. Stomach/Bowel: Distal esophagus, stomach and duodenal sweep are unremarkable. No small bowel wall thickening or dilatation. No evidence of obstruction. Appendix is not visualized. No focal inflammation the vicinity of the cecum to suggest an occult appendicitis. Proximal colon is fairly unremarkable. There is fairly diffuse segmental thickening of the mid to distal colon is seen from the distal transverse to rectosigmoid. A slightly more irregular region of slightly more asymmetric thickening is seen in the distal transverse colon (5/49, 2/49). Though this may reflect a focal thickening of a proximal colonic diverticulum it is favored to be secondary. Numerous additional colonic diverticula present. No extraluminal gas or fluid collection. No pneumatosis or portal venous gas. Vascular/Lymphatic: No significant vascular findings are present. No enlarged abdominal or pelvic lymph nodes. Reproductive: Status post hysterectomy. No adnexal masses. Other: No abdominal wall hernia or abnormality. No abdominopelvic ascites. Small amount of subcutaneous stranding along the anterior abdominal  wall, possibly related to injectable use such as insulin. Musculoskeletal: Prior total right hip arthroplasty in expected alignment. Some mild degenerative changes throughout the  spine, slight dextrocurvature at the mid lumbar levels, apex L3. Additional degenerative change seen in the left hip and bilateral SI joints. No acute or worrisome osseous lesion. IMPRESSION: 1. Fairly diffuse segmental thickening of the mid to distal colon is seen from the distal transverse to rectosigmoid, consistent with colitis, which may be infectious or inflammatory in etiology. No evidence of perforation or abscess formation. 2. A slightly more irregular region of focal asymmetric thickening in the distal transverse colon may inflammation of a broad-based colonic diverticulum though favored to be secondary however recommend correlation with colonoscopy on an outpatient basis to exclude underlying lesion. 3. Chronic right UPJ obstruction with mild right hydronephrosis. 4. Small amount of subcutaneous stranding along the anterior abdominal wall, possibly related to injectable use such as insulin. 5. Stable 5 mm solid nodule in the posterior aspect left lower lobe. Unchanged and previously characterized as benign. 6. Aortic Atherosclerosis (ICD10-I70.0). Electronically Signed   By: Lovena Le M.D.   On: 12/08/2019 19:57   CT ABDOMEN PELVIS W CONTRAST  Result Date: 11/29/2019 CLINICAL DATA:  Left lower quadrant abdominal pain EXAM: CT ABDOMEN AND PELVIS WITH CONTRAST TECHNIQUE: Multidetector CT imaging of the abdomen and pelvis was performed using the standard protocol following bolus administration of intravenous contrast. CONTRAST:  123mL OMNIPAQUE IOHEXOL 300 MG/ML  SOLN COMPARISON:  11/25/2019 FINDINGS: Lower chest: Stable benign 5 mm nodule at the posterior aspect of the left lung base (series 6, image 15). Mild bibasilar subsegmental atelectasis. Heart size is normal. Hepatobiliary: There are 2 somewhat ill-defined areas of  low-attenuation within the liver adjacent to the porta hepatis and falciform ligament suggesting sites of focal fatty infiltration (series 2, images 23 and 26), unchanged compared to multiple prior studies. No new focal liver abnormality. Unremarkable appearance of the gallbladder. No hyperdense gallstone or biliary dilatation. Pancreas: Unremarkable. No pancreatic ductal dilatation or surrounding inflammatory changes. Spleen: Normal in size without focal abnormality. Adrenals/Urinary Tract: Unremarkable adrenal glands. 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 Left kidney is unremarkable. Bilateral ureters are nondilated. Urinary bladder appears within normal limits. Stomach/Bowel: Stomach is within normal limits. Appendix not definitively visualized. Scattered colonic diverticulosis. No evidence of bowel wall thickening, distention, or inflammatory changes. Moderate volume of stool throughout the colon. Vascular/Lymphatic: Scattered aortoiliac atherosclerotic calcifications without aneurysm. No abdominopelvic lymphadenopathy. Reproductive: Status post hysterectomy. No adnexal masses. Other: No free fluid. No abdominopelvic fluid collection. No pneumoperitoneum. Small fat containing umbilical hernia. Musculoskeletal: Right hip arthroplasty. No new or acute osseous findings. IMPRESSION: 1. No acute abdominopelvic findings. 2. Scattered colonic diverticulosis without evidence of acute diverticulitis. 3. Moderate volume of stool throughout the colon. 4. 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 5. Aortic atherosclerosis. (ICD10-I70.0). Electronically Signed   By: Davina Poke D.O.   On: 11/29/2019 13:27   CT ABDOMEN PELVIS W CONTRAST  Result Date: 11/25/2019 CLINICAL DATA:  74 year old female with abdominal pain concerning for diverticulitis EXAM: CT ABDOMEN AND PELVIS WITH CONTRAST TECHNIQUE: Multidetector CT imaging of the  abdomen and pelvis was performed using the standard protocol following bolus administration of intravenous contrast. CONTRAST:  112mL OMNIPAQUE IOHEXOL 300 MG/ML  SOLN COMPARISON:  01/26/2019 FINDINGS: Lower chest: Chronic changes at the bilateral lung bases. Unchanged benign-appearing 5 mm nodule at the left lung base. Hepatobiliary: Unremarkable liver.  Unremarkable gallbladder. Pancreas: Unremarkable Spleen: Unremarkable Adrenals/Urinary Tract: - Right adrenal gland:  Unremarkable - Left adrenal gland: Unremarkable. - Right kidney: Pelvicaliectasis of the right kidney again demonstrated.  Transition to a decompressed collecting system at the UPJ. No nephrolithiasis. Unremarkable course of the ureter. No focal lesion. - Left Kidney: No hydronephrosis, nephrolithiasis, inflammation, or ureteral dilation. No focal lesion. - Urinary Bladder: Unremarkable. Stomach/Bowel: - Stomach: Unremarkable. - Small bowel: Unremarkable - Appendix: Appendix is not visualized, however, no inflammatory changes are present adjacent to the cecum to indicate an appendicitis. - Colon: Diverticular are present throughout the left colon/sigmoid colon. Subtle circumferential thickening of the sigmoid colon and distal descending colon. Inspissated secretions/fecalith within a few diverticula of the left colon. Subtle inflammatory changes without local abscess. No evidence of perforation. Vascular/Lymphatic: Mild atherosclerosis of the abdominal aorta. No aneurysm. Bilateral iliac arteries are patent. Proximal femoral arteries are patent. Reproductive: Hysterectomy Other: Fat containing umbilical hernia. Musculoskeletal: Surgical changes of right hip arthroplasty. Degenerative changes of the thoracolumbar spine without bony canal narrowing. Nonspecific pelvic floor laxity. IMPRESSION: Mild inflammatory changes of the left colon associated with regions of diverticula, perhaps indicating early diverticular disease. No evidence of perforation or  local abscess. Redemonstration of right-sided UPJ stenosis with associated pelvicaliectasis. Referral for urologic evaluation should be considered, if not already performed. Aortic Atherosclerosis (ICD10-I70.0). Additional ancillary findings as above. Electronically Signed   By: Corrie Mckusick D.O.   On: 11/25/2019 11:41     Time coordinating discharge: Over 30 minutes    Dwyane Dee, MD  [facility] 12/10/2019, 2:28 PM Pager: Secure chat  If 7PM-7AM, please contact night-coverage [email or web address] Password [email or web address]

## 2019-12-13 DIAGNOSIS — K529 Noninfective gastroenteritis and colitis, unspecified: Secondary | ICD-10-CM | POA: Diagnosis not present

## 2019-12-13 DIAGNOSIS — R195 Other fecal abnormalities: Secondary | ICD-10-CM | POA: Diagnosis not present

## 2019-12-13 DIAGNOSIS — R110 Nausea: Secondary | ICD-10-CM | POA: Diagnosis not present

## 2019-12-13 DIAGNOSIS — R197 Diarrhea, unspecified: Secondary | ICD-10-CM | POA: Diagnosis not present

## 2019-12-13 DIAGNOSIS — R935 Abnormal findings on diagnostic imaging of other abdominal regions, including retroperitoneum: Secondary | ICD-10-CM | POA: Diagnosis not present

## 2019-12-14 DIAGNOSIS — R195 Other fecal abnormalities: Secondary | ICD-10-CM | POA: Diagnosis not present

## 2019-12-14 DIAGNOSIS — K529 Noninfective gastroenteritis and colitis, unspecified: Secondary | ICD-10-CM | POA: Diagnosis not present

## 2019-12-14 DIAGNOSIS — R110 Nausea: Secondary | ICD-10-CM | POA: Diagnosis not present

## 2019-12-14 DIAGNOSIS — R252 Cramp and spasm: Secondary | ICD-10-CM | POA: Diagnosis not present

## 2019-12-14 DIAGNOSIS — R531 Weakness: Secondary | ICD-10-CM | POA: Diagnosis not present

## 2019-12-14 DIAGNOSIS — R197 Diarrhea, unspecified: Secondary | ICD-10-CM | POA: Diagnosis not present

## 2019-12-28 ENCOUNTER — Encounter: Payer: Self-pay | Admitting: Psychiatry

## 2020-01-04 ENCOUNTER — Encounter: Payer: Self-pay | Admitting: Psychiatry

## 2020-01-04 ENCOUNTER — Telehealth (INDEPENDENT_AMBULATORY_CARE_PROVIDER_SITE_OTHER): Payer: Medicare Other | Admitting: Psychiatry

## 2020-01-04 DIAGNOSIS — G2581 Restless legs syndrome: Secondary | ICD-10-CM | POA: Diagnosis not present

## 2020-01-04 DIAGNOSIS — F411 Generalized anxiety disorder: Secondary | ICD-10-CM | POA: Diagnosis not present

## 2020-01-04 DIAGNOSIS — F5101 Primary insomnia: Secondary | ICD-10-CM

## 2020-01-04 DIAGNOSIS — F319 Bipolar disorder, unspecified: Secondary | ICD-10-CM

## 2020-01-04 MED ORDER — LITHIUM CARBONATE ER 450 MG PO TBCR: 450.0000 mg | EXTENDED_RELEASE_TABLET | Freq: Every day | ORAL | 0 refills | Status: DC

## 2020-01-04 MED ORDER — DONEPEZIL HCL 10 MG PO TABS
10.0000 mg | ORAL_TABLET | Freq: Every day | ORAL | 0 refills | Status: DC
Start: 2020-01-04 — End: 2020-04-23

## 2020-01-04 MED ORDER — MEMANTINE HCL 10 MG PO TABS
ORAL_TABLET | ORAL | 0 refills | Status: DC
Start: 2020-01-04 — End: 2020-04-23

## 2020-01-04 MED ORDER — ROPINIROLE HCL 0.5 MG PO TABS: ORAL_TABLET | ORAL | 0 refills | Status: DC

## 2020-01-04 MED ORDER — QUETIAPINE FUMARATE 200 MG PO TABS: ORAL_TABLET | ORAL | 0 refills | Status: DC

## 2020-01-04 MED ORDER — DIVALPROEX SODIUM 250 MG PO DR TAB: 500.0000 mg | DELAYED_RELEASE_TABLET | Freq: Every day | ORAL | 0 refills | Status: DC

## 2020-01-04 MED ORDER — ALPRAZOLAM 0.5 MG PO TABS: 0.5000 mg | ORAL_TABLET | Freq: Four times a day (QID) | ORAL | 2 refills | Status: DC | PRN
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
## 2020-01-05 DIAGNOSIS — Z23 Encounter for immunization: Secondary | ICD-10-CM | POA: Diagnosis not present

## 2020-01-15 ENCOUNTER — Other Ambulatory Visit: Payer: Self-pay | Admitting: Gastroenterology

## 2020-01-15 DIAGNOSIS — R194 Change in bowel habit: Secondary | ICD-10-CM | POA: Diagnosis not present

## 2020-01-15 DIAGNOSIS — R1084 Generalized abdominal pain: Secondary | ICD-10-CM

## 2020-01-15 DIAGNOSIS — K529 Noninfective gastroenteritis and colitis, unspecified: Secondary | ICD-10-CM | POA: Diagnosis not present

## 2020-01-16 ENCOUNTER — Ambulatory Visit
Admission: RE | Admit: 2020-01-16 | Discharge: 2020-01-16 | Disposition: A | Discharge status: Home / Self Care | Payer: Medicare Other | Source: Ambulatory Visit | Attending: Gastroenterology | Admitting: Gastroenterology

## 2020-01-16 DIAGNOSIS — K5730 Diverticulosis of large intestine without perforation or abscess without bleeding: Secondary | ICD-10-CM | POA: Diagnosis not present

## 2020-01-16 DIAGNOSIS — Z90710 Acquired absence of both cervix and uterus: Secondary | ICD-10-CM | POA: Diagnosis not present

## 2020-01-16 DIAGNOSIS — R1084 Generalized abdominal pain: Secondary | ICD-10-CM | POA: Diagnosis not present

## 2020-01-16 DIAGNOSIS — K529 Noninfective gastroenteritis and colitis, unspecified: Secondary | ICD-10-CM | POA: Diagnosis not present

## 2020-01-16 DIAGNOSIS — R194 Change in bowel habit: Secondary | ICD-10-CM | POA: Diagnosis not present

## 2020-01-16 DIAGNOSIS — K429 Umbilical hernia without obstruction or gangrene: Secondary | ICD-10-CM | POA: Diagnosis not present

## 2020-01-16 DIAGNOSIS — I700 Atherosclerosis of aorta: Secondary | ICD-10-CM | POA: Diagnosis not present

## 2020-01-16 MED ORDER — IOPAMIDOL (ISOVUE-300) INJECTION 61%
100.0000 mL | Freq: Once | INTRAVENOUS | Status: AC | PRN
  Administered 2020-01-16: 100 mL via INTRAVENOUS

## 2020-01-22 DIAGNOSIS — Z1159 Encounter for screening for other viral diseases: Secondary | ICD-10-CM | POA: Diagnosis not present

## 2020-01-25 DIAGNOSIS — R933 Abnormal findings on diagnostic imaging of other parts of digestive tract: Secondary | ICD-10-CM | POA: Diagnosis not present

## 2020-01-25 DIAGNOSIS — K5730 Diverticulosis of large intestine without perforation or abscess without bleeding: Secondary | ICD-10-CM | POA: Diagnosis not present

## 2020-01-25 DIAGNOSIS — K621 Rectal polyp: Secondary | ICD-10-CM | POA: Diagnosis not present

## 2020-01-25 DIAGNOSIS — R197 Diarrhea, unspecified: Secondary | ICD-10-CM | POA: Diagnosis not present

## 2020-01-25 DIAGNOSIS — R1084 Generalized abdominal pain: Secondary | ICD-10-CM | POA: Diagnosis not present

## 2020-01-25 DIAGNOSIS — R1030 Lower abdominal pain, unspecified: Secondary | ICD-10-CM | POA: Diagnosis not present

## 2020-01-30 DIAGNOSIS — K621 Rectal polyp: Secondary | ICD-10-CM | POA: Diagnosis not present

## 2020-02-08 ENCOUNTER — Encounter: Payer: Medicare Other | Attending: Endocrinology | Admitting: Dietician

## 2020-02-13 ENCOUNTER — Other Ambulatory Visit: Payer: Self-pay | Admitting: Endocrinology

## 2020-02-13 NOTE — Telephone Encounter (Signed)
Patient called to request a refill for Ozempic be sent to [facility] on [address]

## 2020-02-14 MED ORDER — OZEMPIC (1 MG/DOSE) 4 MG/3ML ~~LOC~~ SOPN: 1.0000 mg | PEN_INJECTOR | SUBCUTANEOUS | 0 refills | Status: DC

## 2020-02-14 NOTE — Telephone Encounter (Signed)
Ozempic refilled

## 2020-02-15 ENCOUNTER — Telehealth: Payer: Self-pay | Admitting: Psychiatry

## 2020-02-15 DIAGNOSIS — F5101 Primary insomnia: Secondary | ICD-10-CM

## 2020-02-15 DIAGNOSIS — F319 Bipolar disorder, unspecified: Secondary | ICD-10-CM

## 2020-02-15 DIAGNOSIS — F411 Generalized anxiety disorder: Secondary | ICD-10-CM

## 2020-02-15 MED ORDER — CLONAZEPAM 1 MG PO TABS: ORAL_TABLET | ORAL | 0 refills | Status: DC

## 2020-02-15 NOTE — Telephone Encounter (Signed)
Please review

## 2020-02-15 NOTE — Telephone Encounter (Signed)
Please let pt know that in the past Klonopin seemed to be the most effective for her manic s/s and sleeplessness and was better tolerated than the antipsychotics. Script was sent for Klonopin. Please advise her not to combine Klonopin with Xanax and to use Klonopin in place of Xanax until manic s/s stabilize.

## 2020-02-15 NOTE — Telephone Encounter (Signed)
Left detailed message with information. Will try patient again in the morning for follow up

## 2020-02-15 NOTE — Telephone Encounter (Signed)
[name] [name] called to make her follow up appt but the first available is 04/09/20.  She reported that she is one of her manic episodes and is not sleeping.  She is only getting about 2 hours of sleep each night.  Could an adjustment be made to help her get better sleep while she gets through her manic espisode?  She uses [facility] on [address] if a prescription is sent in.

## 2020-02-16 ENCOUNTER — Other Ambulatory Visit: Payer: Self-pay | Admitting: Endocrinology

## 2020-02-16 DIAGNOSIS — M545 Low back pain, unspecified: Secondary | ICD-10-CM | POA: Diagnosis not present

## 2020-02-19 ENCOUNTER — Other Ambulatory Visit: Payer: Self-pay | Admitting: Endocrinology

## 2020-02-21 ENCOUNTER — Telehealth: Payer: Self-pay | Admitting: Psychiatry

## 2020-02-21 DIAGNOSIS — F319 Bipolar disorder, unspecified: Secondary | ICD-10-CM

## 2020-02-21 MED ORDER — LITHIUM CARBONATE ER 450 MG PO TBCR: 450.0000 mg | EXTENDED_RELEASE_TABLET | Freq: Every day | ORAL | 0 refills | Status: DC

## 2020-02-21 NOTE — Telephone Encounter (Signed)
Received fax refill request from pt's pharmacy for Lithium. Script sent.

## 2020-02-22 ENCOUNTER — Other Ambulatory Visit: Payer: Self-pay | Admitting: Endocrinology

## 2020-02-26 ENCOUNTER — Telehealth: Payer: Self-pay | Admitting: Psychiatry

## 2020-02-26 NOTE — Telephone Encounter (Signed)
Krystian called to report that the Klonopin that you prescribed about a week ago is not working.  Please call to discuss options.

## 2020-02-26 NOTE — Telephone Encounter (Signed)
She reports that she is still sleeping very little, and averaging about 2-3 hours a night. She reports that she has not been able to relax. She reports that she is taking Klonopin 1 mg po BID during the day. She reports that has been prescribed oxycodone for severe back pain and has not wanted to combine klonopin with oxycodone. She reports that she has been baking throughout the night and has been gambling. She reports that her husband has noticed manic s/s for 4 weeks.   PLAN: Discussed taking Klonopin 2 mg at bedtime tonight since she reports taking Oxycodone earlier today and does not plan to take it again this evening. Agreed with not combining Klonopin and oxycodone. Discussed that she can take Klonopin 1 mg in the morning, 1 mg mid-day, and 1-2 tabs po QHS. Discussed that she can back down HS dose of Klonopin to 1.5 mg po QHS if 2 mg is too sedating. Patient advised to contact office with any questions, adverse effects, or acute worsening in signs and symptoms.

## 2020-03-01 DIAGNOSIS — L03011 Cellulitis of right finger: Secondary | ICD-10-CM | POA: Diagnosis not present

## 2020-03-01 DIAGNOSIS — S61214A Laceration without foreign body of right ring finger without damage to nail, initial encounter: Secondary | ICD-10-CM | POA: Diagnosis not present

## 2020-03-03 DIAGNOSIS — S61214D Laceration without foreign body of right ring finger without damage to nail, subsequent encounter: Secondary | ICD-10-CM | POA: Diagnosis not present

## 2020-03-04 DIAGNOSIS — Z96652 Presence of left artificial knee joint: Secondary | ICD-10-CM | POA: Diagnosis not present

## 2020-03-06 ENCOUNTER — Telehealth: Payer: Self-pay | Admitting: Psychiatry

## 2020-03-06 NOTE — Telephone Encounter (Signed)
Returned call to patient. She reports, "I am finally sleeping." She reports that she is sleeping off and on during the daytime. She reports that she is sleeping 4-5 hours at night. She reports that manic s/s have resolved and is no longer having increased goal-directed activity, excessive spending, or elevated mood. She reports that she had company from early December until yesterday.   Has been taking Klonopin 1 mg BID.   Plan: Take 1/2 tab of Klonopin in the morning and 1 tab po QHS. Discussed that she can further decrease to taking daytime dose only as needed and continuing Klonopin at HS. Discussed continuing Klonopin until sleep fully normalizes and continuing to use Klonopin in place of Xanax. Discussed that Klonopin could be switched back to Xanax once sleep stabilizes.   Advised to call back with any worsening s/s or if excessive daytime somnolence does not resolve.

## 2020-03-06 NOTE — Telephone Encounter (Signed)
[name] [name] called and says the Clonazepam is still making her sleep a lot more and she stays sleepy. Can you decrease it a little more and if so by how much. Her number is [phone].

## 2020-03-11 DIAGNOSIS — N3946 Mixed incontinence: Secondary | ICD-10-CM | POA: Diagnosis not present

## 2020-03-11 DIAGNOSIS — N3000 Acute cystitis without hematuria: Secondary | ICD-10-CM | POA: Diagnosis not present

## 2020-03-11 DIAGNOSIS — N1330 Unspecified hydronephrosis: Secondary | ICD-10-CM | POA: Diagnosis not present

## 2020-03-13 DIAGNOSIS — M25562 Pain in left knee: Secondary | ICD-10-CM | POA: Diagnosis not present

## 2020-03-14 DIAGNOSIS — E039 Hypothyroidism, unspecified: Secondary | ICD-10-CM | POA: Diagnosis not present

## 2020-03-14 DIAGNOSIS — I10 Essential (primary) hypertension: Secondary | ICD-10-CM | POA: Diagnosis not present

## 2020-03-14 DIAGNOSIS — R600 Localized edema: Secondary | ICD-10-CM | POA: Diagnosis not present

## 2020-03-14 DIAGNOSIS — Z23 Encounter for immunization: Secondary | ICD-10-CM | POA: Diagnosis not present

## 2020-03-18 DIAGNOSIS — M25562 Pain in left knee: Secondary | ICD-10-CM | POA: Diagnosis not present

## 2020-03-19 ENCOUNTER — Telehealth: Payer: Self-pay | Admitting: Endocrinology

## 2020-03-19 NOTE — Telephone Encounter (Signed)
Patient asked if someone could give her a call back regarding her sugars, she said her sugar has been high and would like to speak to someone. Please call patient at ph# [phone]

## 2020-03-20 ENCOUNTER — Telehealth: Payer: Self-pay

## 2020-03-20 NOTE — Telephone Encounter (Signed)
What time of the day is her blood sugar high.  Can she give [place] the readings for the last 3 days

## 2020-03-20 NOTE — Telephone Encounter (Signed)
Patient stated her highs are all the time, no specific times and the readings are as follow  215 03-16-20 1829 177 02-16-20 1014 180 02-15-20 1658

## 2020-03-20 NOTE — Telephone Encounter (Signed)
She will first increase her Lantus to 56 units.  If her blood sugars after 3 days are still over 180 about 2 hours after eating then she can start taking HUMALOG 5 units before each meal

## 2020-03-20 NOTE — Telephone Encounter (Signed)
Patient has been notified

## 2020-03-20 NOTE — Telephone Encounter (Signed)
Patient called to let Dr Dwyane Dee know that her sugars been elevated up to 218 for the past three weeks. Patient stated Dr Dwyane Dee adds Humalog to help when this happened. She is curremtly taking ozempic once a week and lantus 50 units daily. Please advise.

## 2020-03-21 ENCOUNTER — Telehealth: Payer: Self-pay | Admitting: Psychiatry

## 2020-03-21 NOTE — Telephone Encounter (Signed)
Another note was started on 03/20/2020 and pt concerns have been addressed. Please see that note for more detailes.

## 2020-03-21 NOTE — Telephone Encounter (Signed)
Pt called and said that the requip is not working for her restless leg syndrome and she is also very anxious. She said her restless legs are moving all the time. Please call her at [phone]

## 2020-03-22 NOTE — Telephone Encounter (Signed)
Pt called to report restless leg is worse and even during the day. Having a a lot of anxiety. Had to take 4 Klonopin yesterday to get calmed down. I moved her apt up to 1/17 from 2/1. She said, she needs something today. She can't wait until Monday apt. Contact # [phone].

## 2020-03-22 NOTE — Telephone Encounter (Signed)
She reports that she is having severe RLS in the morning and throughout the day. She reports that she is also experiencing increased anxiety. "I feel like I am coming out of my skin." She reports that she took Klonopin 2 mg yesterday after noon and again last night. She took 2 mg again this afternoon. Taking Gabapentin 600 mg three times daily.   Reports that glucose levels have increased. Appetite has been decreased. She reports that her sleep was improved with Klonopin and then it seemed to be no longer be effective. Reports that she has had 2 sleepless nights. Denies any recent impulsive behaviors. Low energy and motivation. She reports that she is "bored." Denies SI.   She reports "we have a lot going on right now." [name] [facility]. She reports multiple stressors.    Case staffed with Dr. Clovis Pu. Decrease Seroquel temporarily to 100 mg at 5 pm and 200 mg po QHS.  Increase Gabapentin 600 mg in the morning and mid-day and 1200 mg 90 minutes before bedtime.  Continue to use Klonopin prn RLS and Anxiety.   Patient advised to contact office with any questions, adverse effects, or acute worsening in signs and symptoms.

## 2020-03-25 ENCOUNTER — Telehealth (INDEPENDENT_AMBULATORY_CARE_PROVIDER_SITE_OTHER): Payer: Medicare Other | Admitting: Psychiatry

## 2020-03-25 ENCOUNTER — Encounter: Payer: Self-pay | Admitting: Psychiatry

## 2020-03-25 DIAGNOSIS — F319 Bipolar disorder, unspecified: Secondary | ICD-10-CM

## 2020-03-25 DIAGNOSIS — F411 Generalized anxiety disorder: Secondary | ICD-10-CM | POA: Diagnosis not present

## 2020-03-25 DIAGNOSIS — F5101 Primary insomnia: Secondary | ICD-10-CM

## 2020-03-25 MED ORDER — CLONAZEPAM 2 MG PO TABS: 2.0000 mg | ORAL_TABLET | Freq: Three times a day (TID) | ORAL | 0 refills | Status: DC | PRN

## 2020-03-25 MED ORDER — PROPRANOLOL HCL 10 MG PO TABS: ORAL_TABLET | ORAL | 0 refills | Status: DC
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
## 2020-03-26 ENCOUNTER — Telehealth: Payer: Self-pay | Admitting: Psychiatry

## 2020-03-26 DIAGNOSIS — N390 Urinary tract infection, site not specified: Secondary | ICD-10-CM | POA: Diagnosis not present

## 2020-03-26 NOTE — Telephone Encounter (Signed)
Called pt to follow-up after visit yesterday. She reports that she feels "pretty out of it." She reports that Propranolol 10 mg was ineffective and she took a second Propranolol prn and noticed some relief in restlessness. She reports that she has continued to take Propranolol 20 mg prn and Klonopin prn. She reports that she has continued to get up and clean house "even though I am out of it." She reports that she was able to sleep brief periods throughout the night. She notices some impulsivity, such as thoughts of wanting to drive in the snow.   Suspects she has a current UTI and plans to seek medical attention today.  Spoke with patient's husband and he reports that pt is "out of it," however her gait is steady and she is not incapacitated like she has been with some medications. He reports that she has made impulsive statements, such as wanting to drive on the ice and turn donuts. He reports that she was able to sleep more than she has in several days and estimates she slept 6 hours total with multiple awakenings.  Agree to continue current plan. Discussed that it would likely take a few days for manic s/s to stabilize. Advised pt and her husband to call the office if s/s worsen.

## 2020-03-27 ENCOUNTER — Telehealth: Payer: Self-pay | Admitting: Psychiatry

## 2020-03-27 NOTE — Telephone Encounter (Signed)
[name] [name] called and would like for you to call her when you have a moment. She said that you have been checking on her. Her number is [phone]. She said she is doing worse today.

## 2020-03-27 NOTE — Telephone Encounter (Addendum)
Returned call to patient. She reports that she was not able to sleep last night. She reports that she has been unsteady today. She reports that last night she made 2 cakes, cleaned the house, and did laundry when she was not able to sleep. Minimal sleep today. Anxiety is not "quite as high." She reports that she continues to feel restless and unable to be still. She reports that Klonopin and Propranolol prn offer some relief. Notices some impulsivity. Reports that she drove today after taking medication.   Take 1/2 Klonopin during the day to minimize unsteadiness and continue Klonopin 2 mg QHS.   Increase Seroquel back to 200 mg at 5 pm and QHS.   Continue Klonopin prn restlessness.   Provider to follow-up tomorrow. Patient advised to contact office with any questions, adverse effects, or acute worsening in signs and symptoms.

## 2020-03-28 ENCOUNTER — Emergency Department (HOSPITAL_COMMUNITY)
Admission: EM | Admit: 2020-03-28 | Discharge: 2020-03-29 | Disposition: A | Discharge status: Home / Self Care | Payer: Medicare Other | Attending: Emergency Medicine | Admitting: Emergency Medicine

## 2020-03-28 ENCOUNTER — Encounter (HOSPITAL_COMMUNITY): Payer: Self-pay | Admitting: Emergency Medicine

## 2020-03-28 ENCOUNTER — Telehealth: Payer: Self-pay | Admitting: Psychiatry

## 2020-03-28 ENCOUNTER — Other Ambulatory Visit: Payer: Self-pay

## 2020-03-28 DIAGNOSIS — Z79899 Other long term (current) drug therapy: Secondary | ICD-10-CM | POA: Insufficient documentation

## 2020-03-28 DIAGNOSIS — E039 Hypothyroidism, unspecified: Secondary | ICD-10-CM | POA: Insufficient documentation

## 2020-03-28 DIAGNOSIS — R45851 Suicidal ideations: Secondary | ICD-10-CM | POA: Diagnosis not present

## 2020-03-28 DIAGNOSIS — Z96652 Presence of left artificial knee joint: Secondary | ICD-10-CM | POA: Diagnosis not present

## 2020-03-28 DIAGNOSIS — I10 Essential (primary) hypertension: Secondary | ICD-10-CM | POA: Insufficient documentation

## 2020-03-28 DIAGNOSIS — Z20822 Contact with and (suspected) exposure to covid-19: Secondary | ICD-10-CM | POA: Diagnosis not present

## 2020-03-28 DIAGNOSIS — Z794 Long term (current) use of insulin: Secondary | ICD-10-CM | POA: Insufficient documentation

## 2020-03-28 DIAGNOSIS — E1142 Type 2 diabetes mellitus with diabetic polyneuropathy: Secondary | ICD-10-CM | POA: Insufficient documentation

## 2020-03-28 DIAGNOSIS — F3160 Bipolar disorder, current episode mixed, unspecified: Secondary | ICD-10-CM | POA: Diagnosis not present

## 2020-03-28 DIAGNOSIS — Z96641 Presence of right artificial hip joint: Secondary | ICD-10-CM | POA: Diagnosis not present

## 2020-03-28 DIAGNOSIS — F3162 Bipolar disorder, current episode mixed, moderate: Secondary | ICD-10-CM | POA: Diagnosis not present

## 2020-03-28 DIAGNOSIS — R4182 Altered mental status, unspecified: Secondary | ICD-10-CM | POA: Diagnosis not present

## 2020-03-28 LAB — COMPREHENSIVE METABOLIC PANEL
ALT: 24 U/L (ref 0–44)
AST: 20 U/L (ref 15–41)
Albumin: 4 g/dL (ref 3.5–5.0)
Alkaline Phosphatase: 86 U/L (ref 38–126)
Anion gap: 12 (ref 5–15)
BUN: 17 mg/dL (ref 8–23)
CO2: 22 mmol/L (ref 22–32)
Calcium: 10.2 mg/dL (ref 8.9–10.3)
Chloride: 105 mmol/L (ref 98–111)
Creatinine, Ser: 1.03 mg/dL — ABNORMAL HIGH (ref 0.44–1.00)
GFR, Estimated: 58 mL/min — ABNORMAL LOW (ref >60)
Glucose, Bld: 161 mg/dL — ABNORMAL HIGH (ref 70–99)
Potassium: 4.5 mmol/L (ref 3.5–5.1)
Sodium: 139 mmol/L (ref 135–145)
Total Bilirubin: 0.7 mg/dL (ref 0.3–1.2)
Total Protein: 7.5 g/dL (ref 6.5–8.1)

## 2020-03-28 LAB — CBC
HCT: 48.5 % — ABNORMAL HIGH (ref 36.0–46.0)
Hemoglobin: 15.3 g/dL — ABNORMAL HIGH (ref 12.0–15.0)
MCH: 29.5 pg (ref 26.0–34.0)
MCHC: 31.5 g/dL (ref 30.0–36.0)
MCV: 93.6 fL (ref 80.0–100.0)
Platelets: 268 10*3/uL (ref 150–400)
RBC: 5.18 MIL/uL — ABNORMAL HIGH (ref 3.87–5.11)
RDW: 13.6 % (ref 11.5–15.5)
WBC: 12.6 10*3/uL — ABNORMAL HIGH (ref 4.0–10.5)
nRBC: 0 % (ref 0.0–0.2)

## 2020-03-28 LAB — ETHANOL: Alcohol, Ethyl (B): <10 mg/dL (ref <10)

## 2020-03-28 LAB — SALICYLATE LEVEL: Salicylate Lvl: <7 mg/dL — ABNORMAL LOW (ref 7.0–30.0)

## 2020-03-28 LAB — ACETAMINOPHEN LEVEL: Acetaminophen (Tylenol), Serum: <10 ug/mL — ABNORMAL LOW (ref 10–30)

## 2020-03-28 NOTE — ED Notes (Signed)
Patient in Room 32 in TCU for TTS assessment.

## 2020-03-28 NOTE — ED Triage Notes (Signed)
Patient states she want to kill herself. Patient took some pills and do not know which one she took. Patients husband brought her.

## 2020-03-28 NOTE — ED Notes (Signed)
From Christina Hussami, LCSW: Just finished her TTS assessment and consulted with Lindon Romp, NP--her disposition recommendation is inpatient treatment--geropsych so I know she will be there at least overnight or longer as she waits for an appropriate bed.

## 2020-03-28 NOTE — ED Notes (Signed)
Patient belongings have been moved to nursing station 16-22.

## 2020-03-28 NOTE — ED Notes (Signed)
Patients belongings in triage nursing station. Patient has changed into appropriate scrubs. Waiting for patients husband to collect her items.
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
## 2020-03-28 NOTE — ED Notes (Signed)
Patients belongings in triage nursing station. Contains jacket, pants, shirt, shoes and a key to other belongings that security has.

## 2020-03-29 ENCOUNTER — Emergency Department (HOSPITAL_COMMUNITY): Payer: Medicare Other

## 2020-03-29 ENCOUNTER — Telehealth: Payer: Self-pay | Admitting: Psychiatry

## 2020-03-29 DIAGNOSIS — G2581 Restless legs syndrome: Secondary | ICD-10-CM | POA: Diagnosis present

## 2020-03-29 DIAGNOSIS — G4700 Insomnia, unspecified: Secondary | ICD-10-CM | POA: Diagnosis present

## 2020-03-29 DIAGNOSIS — R4182 Altered mental status, unspecified: Secondary | ICD-10-CM | POA: Diagnosis not present

## 2020-03-29 DIAGNOSIS — Z885 Allergy status to narcotic agent status: Secondary | ICD-10-CM | POA: Diagnosis not present

## 2020-03-29 DIAGNOSIS — M1611 Unilateral primary osteoarthritis, right hip: Secondary | ICD-10-CM | POA: Diagnosis present

## 2020-03-29 DIAGNOSIS — F3130 Bipolar disorder, current episode depressed, mild or moderate severity, unspecified: Secondary | ICD-10-CM | POA: Diagnosis present

## 2020-03-29 DIAGNOSIS — Z96641 Presence of right artificial hip joint: Secondary | ICD-10-CM | POA: Diagnosis present

## 2020-03-29 DIAGNOSIS — Z96652 Presence of left artificial knee joint: Secondary | ICD-10-CM | POA: Diagnosis present

## 2020-03-29 DIAGNOSIS — E039 Hypothyroidism, unspecified: Secondary | ICD-10-CM | POA: Diagnosis not present

## 2020-03-29 DIAGNOSIS — K55039 Acute (reversible) ischemia of large intestine, extent unspecified: Secondary | ICD-10-CM | POA: Diagnosis present

## 2020-03-29 DIAGNOSIS — Z20822 Contact with and (suspected) exposure to covid-19: Secondary | ICD-10-CM | POA: Diagnosis not present

## 2020-03-29 DIAGNOSIS — F332 Major depressive disorder, recurrent severe without psychotic features: Secondary | ICD-10-CM | POA: Diagnosis not present

## 2020-03-29 DIAGNOSIS — E1142 Type 2 diabetes mellitus with diabetic polyneuropathy: Secondary | ICD-10-CM | POA: Diagnosis not present

## 2020-03-29 DIAGNOSIS — F411 Generalized anxiety disorder: Secondary | ICD-10-CM | POA: Diagnosis present

## 2020-03-29 DIAGNOSIS — Z818 Family history of other mental and behavioral disorders: Secondary | ICD-10-CM | POA: Diagnosis not present

## 2020-03-29 DIAGNOSIS — M1612 Unilateral primary osteoarthritis, left hip: Secondary | ICD-10-CM | POA: Diagnosis present

## 2020-03-29 DIAGNOSIS — I10 Essential (primary) hypertension: Secondary | ICD-10-CM | POA: Diagnosis not present

## 2020-03-29 DIAGNOSIS — F3160 Bipolar disorder, current episode mixed, unspecified: Secondary | ICD-10-CM | POA: Diagnosis not present

## 2020-03-29 DIAGNOSIS — R45851 Suicidal ideations: Secondary | ICD-10-CM | POA: Diagnosis not present

## 2020-03-29 DIAGNOSIS — M797 Fibromyalgia: Secondary | ICD-10-CM | POA: Diagnosis present

## 2020-03-29 DIAGNOSIS — E119 Type 2 diabetes mellitus without complications: Secondary | ICD-10-CM | POA: Diagnosis present

## 2020-03-29 DIAGNOSIS — Z90710 Acquired absence of both cervix and uterus: Secondary | ICD-10-CM | POA: Diagnosis not present

## 2020-03-29 LAB — URINALYSIS, ROUTINE W REFLEX MICROSCOPIC
Bilirubin Urine: NEGATIVE
Glucose, UA: NEGATIVE mg/dL
Hgb urine dipstick: NEGATIVE
Ketones, ur: NEGATIVE mg/dL
Nitrite: NEGATIVE
Protein, ur: 30 mg/dL — AB
Specific Gravity, Urine: 1.019 (ref 1.005–1.030)
WBC, UA: >50 WBC/hpf — ABNORMAL HIGH (ref 0–5)
pH: 5 (ref 5.0–8.0)

## 2020-03-29 LAB — RAPID URINE DRUG SCREEN, HOSP PERFORMED
Amphetamines: NOT DETECTED
Barbiturates: NOT DETECTED
Benzodiazepines: POSITIVE — AB
Cocaine: NOT DETECTED
Opiates: NOT DETECTED
Tetrahydrocannabinol: NOT DETECTED

## 2020-03-29 LAB — CBG MONITORING, ED: Glucose-Capillary: 110 mg/dL — ABNORMAL HIGH (ref 70–99)

## 2020-03-29 LAB — RESP PANEL BY RT-PCR (FLU A&B, COVID) ARPGX2
Influenza A by PCR: NEGATIVE
Influenza B by PCR: NEGATIVE
SARS Coronavirus 2 by RT PCR: NEGATIVE

## 2020-03-29 MED ORDER — ROPINIROLE HCL 1 MG PO TABS: 1.5000 mg | ORAL_TABLET | Freq: Every day | ORAL | Status: DC

## 2020-03-29 MED ORDER — QUETIAPINE FUMARATE 100 MG PO TABS: 200.0000 mg | ORAL_TABLET | Freq: Every day | ORAL | Status: DC

## 2020-03-29 MED ORDER — GABAPENTIN 300 MG PO CAPS
600.0000 mg | ORAL_CAPSULE | Freq: Three times a day (TID) | ORAL | Status: DC
  Administered 2020-03-29: 600 mg via ORAL
  Filled 2020-03-29: qty 2

## 2020-03-29 MED ORDER — CLONAZEPAM 0.5 MG PO TABS
2.0000 mg | ORAL_TABLET | Freq: Three times a day (TID) | ORAL | Status: DC | PRN
  Administered 2020-03-29: 2 mg via ORAL
  Filled 2020-03-29: qty 4

## 2020-03-29 MED ORDER — ACETAMINOPHEN 500 MG PO TABS: 1000.0000 mg | ORAL_TABLET | Freq: Three times a day (TID) | ORAL | Status: DC | PRN

## 2020-03-29 MED ORDER — LOPERAMIDE HCL 2 MG PO CAPS
4.0000 mg | ORAL_CAPSULE | ORAL | Status: DC | PRN
  Administered 2020-03-29: 4 mg via ORAL
  Filled 2020-03-29: qty 2

## 2020-03-29 MED ORDER — LEVOTHYROXINE SODIUM 50 MCG PO TABS
75.0000 ug | ORAL_TABLET | Freq: Every day | ORAL | Status: DC
  Administered 2020-03-29: 75 ug via ORAL
  Filled 2020-03-29: qty 1

## 2020-03-29 MED ORDER — MEMANTINE HCL 5 MG PO TABS
10.0000 mg | ORAL_TABLET | Freq: Every day | ORAL | Status: DC
  Administered 2020-03-29: 10 mg via ORAL
  Filled 2020-03-29: qty 2

## 2020-03-29 MED ORDER — DIVALPROEX SODIUM 500 MG PO DR TAB: 500.0000 mg | DELAYED_RELEASE_TABLET | Freq: Every day | ORAL | Status: DC

## 2020-03-29 MED ORDER — ROPINIROLE HCL 0.5 MG PO TABS: 0.5000 mg | ORAL_TABLET | ORAL | Status: DC

## 2020-03-29 MED ORDER — ATORVASTATIN CALCIUM 40 MG PO TABS: 40.0000 mg | ORAL_TABLET | Freq: Every day | ORAL | Status: DC

## 2020-03-29 MED ORDER — INSULIN GLARGINE 100 UNIT/ML ~~LOC~~ SOLN
50.0000 [IU] | Freq: Every day | SUBCUTANEOUS | Status: DC
  Filled 2020-03-29: qty 0.5

## 2020-03-29 MED ORDER — PANTOPRAZOLE SODIUM 40 MG PO TBEC
40.0000 mg | DELAYED_RELEASE_TABLET | Freq: Every day | ORAL | Status: DC
  Administered 2020-03-29: 40 mg via ORAL
  Filled 2020-03-29: qty 1

## 2020-03-29 MED ORDER — DONEPEZIL HCL 5 MG PO TABS: 10.0000 mg | ORAL_TABLET | Freq: Every day | ORAL | Status: DC

## 2020-03-29 MED ORDER — DOCUSATE SODIUM 100 MG PO CAPS
100.0000 mg | ORAL_CAPSULE | Freq: Every day | ORAL | Status: DC
  Administered 2020-03-29: 100 mg via ORAL
  Filled 2020-03-29: qty 1

## 2020-03-29 MED ORDER — ROPINIROLE HCL 0.5 MG PO TABS
0.5000 mg | ORAL_TABLET | Freq: Every day | ORAL | Status: DC | PRN
  Filled 2020-03-29: qty 1

## 2020-03-29 MED ORDER — ROPINIROLE HCL 1 MG PO TABS: 2.0000 mg | ORAL_TABLET | ORAL | Status: DC

## 2020-03-29 NOTE — ED Notes (Signed)
Pt given sandwich and diet coke 

## 2020-03-29 NOTE — Telephone Encounter (Signed)
[name] [name] has called and said that you were going to call her about going to the hospital in [place]. Her number is [phone]

## 2020-03-29 NOTE — ED Provider Notes (Signed)
[facility] DEPT Provider Note   CSN: [id number] Arrival date & time: 03/28/20  1921     History Chief Complaint  Patient presents with  . Suicidal    [name] [name] is a 75 y.o. female.  Presents to ER with concern for suicidal thoughts.  Patient states she is in a long history of depression and past suicidal thoughts, worse over the last few weeks.  States that she took a 3-4 propranolol and Klonopin earlier today with thoughts of wanting to hurt herself.  Is interested in receiving help.  She feels slightly sleepy but otherwise denies any medical complaints.  HPI     Past Medical History:  Diagnosis Date  . Alcohol abuse   . Anxiety    takes Valium daily as needed and Ativan daily  . Bilateral hearing loss   . Bipolar 1 disorder ([place])    takes Lithium nightly and Synthroid daily  . Chronic back pain    DDD; "all over" (09/14/2017)  . Colitis, ischemic ([place]) 2012  . Confusion    r/t meds  . Depression    takes Prozac daily and Bupspirone   . Diverticulosis   . Dyslipidemia    takes Crestor daily  . Fibromyalgia   . Headache    "weekly" (09/14/2017)  . Hepatic steatosis 06/18/12   severe  . Hyperlipidemia   . Hypertension   . Hypothyroidism   . IBS (irritable bowel syndrome)   . Ischemic colitis ([place])   . Joint pain   . Joint swelling   . Lupus erythematosus tumidus    tumid-skin  . Migraine    "1-2/yr; maybe" (09/14/2017)  . Numbness    in right foot  . Osteoarthritis    "all over" (09/14/2017)  . Osteoarthritis cervical spine   . Osteoarthritis of hand    bilateral  . Pneumonia    "walking pneumonia several times; long time since the last time" (09/14/2017)  . Restless leg syndrome    takes Requip nightly  . Sciatica   . Type II diabetes mellitus ([place])   . Urinary frequency   . Urinary leakage   . Urinary urgency   . Urinary, incontinence, stress female   . Walking pneumonia    last time more than 90yrs ago    Patient  Active Problem List   Diagnosis Date Noted  . Bipolar 1 disorder, mixed, moderate ([place])   . Suicidal ideations   . Overweight (BMI 25.0-29.9) 02/08/2019  . S/P left TKA 02/07/2019  . Chest pain   . Chest pain with normal coronary angiography   . Diverticulitis 11/19/2016  . Hypothyroidism 07/14/2016  . Diarrhea   . Generalized abdominal pain   . Major depressive disorder, recurrent severe without psychotic features ([place]) 12/31/2015  . Nausea without vomiting   . Colitis 10/28/2015  . Unintentional poisoning by psychotropic drug 07/05/2015  . Restless leg syndrome 07/05/2015  . Diabetic polyneuropathy associated with type 2 diabetes mellitus ([place]) 09/21/2014  . Arthritis of left hip 06/09/2013  . Arthritis of right hip 06/08/2013  . Hip pain, right 05/02/2013  . IBS (irritable bowel syndrome) 01/02/2013  . Unspecified vitamin D deficiency 07/05/2012  . Pure hyperglyceridemia 07/04/2012  . Diabetes mellitus with diabetic neuropathy, without long-term current use of insulin ([place]) 06/18/2012  . Hypertension 06/18/2012  . Bipolar 1 disorder ([place]) 06/18/2012  . Low back pain 02/25/2011  . Acute ischemic colitis ([place]) 01/06/2011  . Sciatica 12/19/2010  . Fibromyalgia 10/28/2010  . Primary  osteoarthritis of right hip 10/24/2010  . Hyperlipidemia with target low density lipoprotein (LDL) cholesterol less than 100 mg/dL 05/05/2010  . DEPRESSION/ANXIETY 05/05/2010  . ABUSE, ALCOHOL, IN REMISSION 05/05/2010  . OTITIS MEDIA, CHRONIC 05/05/2010  . Hearing loss 05/05/2010  . ALLERGIC RHINITIS, SEASONAL, MILD 05/05/2010  . URINARY INCONTINENCE, STRESS, FEMALE 05/05/2010  . OSTEOARTHRITIS, HANDS, BILATERAL 05/05/2010  . OSTEOARTHRITIS, CERVICAL SPINE 05/05/2010    Past Surgical History:  Procedure Laterality Date  . ABDOMINAL HYSTERECTOMY     "they left my ovaries"  . APPENDECTOMY    . CARDIAC CATHETERIZATION  09/14/2017  . COLONOSCOPY    . DENTAL SURGERY Left 10/2016   dental  implant  . DILATION AND CURETTAGE OF UTERUS  X 4  . ESOPHAGOGASTRODUODENOSCOPY    . FLEXIBLE SIGMOIDOSCOPY N/A 06/21/2012   Procedure: FLEXIBLE SIGMOIDOSCOPY;  Surgeon: Jerene Bears, MD;  Location: [facility];  Service: Gastroenterology;  Laterality: N/A;  . JOINT REPLACEMENT    . LEFT HEART CATH AND CORONARY ANGIOGRAPHY N/A 09/14/2017   Procedure: LEFT HEART CATH AND CORONARY ANGIOGRAPHY;  Surgeon: Belva Crome, MD;  Location: [facility];  Service: Cardiovascular;  Laterality: N/A;  . SHOULDER ARTHROSCOPY Right    "shaved spurs off rotator cuff"  . TONSILLECTOMY    . TOTAL HIP ARTHROPLASTY Right 06/09/2013   Procedure: TOTAL HIP ARTHROPLASTY;  Surgeon: Kerin Salen, MD;  Location: [facility];  Service: Orthopedics;  Laterality: Right;  . TOTAL KNEE ARTHROPLASTY Left 02/07/2019   Procedure: TOTAL KNEE ARTHROPLASTY;  Surgeon: Paralee Cancel, MD;  Location: [facility];  Service: Orthopedics;  Laterality: Left;  70 mins  . TUBAL LIGATION    . TUMOR EXCISION Right 1968   angle of jaw; benign     OB History   No obstetric history on file.     Family History  Problem Relation Age of Onset  . Drug abuse Mother   . Alcohol abuse Mother   . Alcohol abuse Father   . Hypertension Father   . CAD Brother   . Hypertension Brother   . Alcohol abuse Brother   . Hypertension Brother   . Hypertension Brother   . Psoriasis Daughter   . Arthritis Daughter        psoriatic arthritis   . Alcohol abuse Grandchild     Social History   Tobacco Use  . Smoking status: Never Smoker  . Smokeless tobacco: Never Used  Vaping Use  . Vaping Use: Never used  Substance Use Topics  . Alcohol use: Not Currently    Comment: 09/14/2017 "nothing since 04/15/2008"  . Drug use: Not Currently    Frequency: 7.0 times per week    Types: Benzodiazepines    Home Medications Prior to Admission medications   Medication Sig Start Date End Date Taking? Authorizing Provider  acetaminophen (TYLENOL) 500 MG tablet  Take 1,000 mg by mouth 3 (three) times daily as needed for moderate pain.    Yes [provider]  atorvastatin (LIPITOR) 40 MG tablet Take 40 mg by mouth at bedtime.    Yes [provider]  celecoxib (CELEBREX) 200 MG capsule Take 200 mg by mouth at bedtime.   Yes [provider]  Cholecalciferol (VITAMIN D) 50 MCG (2000 UT) tablet Take 2,000 Units by mouth daily.   Yes [provider]  Cyanocobalamin (VITAMIN B 12 PO) Take 1 tablet by mouth daily.    Yes [provider]  divalproex (DEPAKOTE) 250 MG DR tablet Take 2  tablets (500 mg total) by mouth at bedtime. 01/04/20  Yes [name], PMHNP  docusate sodium (COLACE) 100 MG capsule Take 1 capsule (100 mg total) by mouth daily. 12/10/19 12/09/20 Yes Dwyane Dee, MD  donepezil (ARICEPT) 10 MG tablet Take 1 tablet (10 mg total) by mouth at bedtime. 01/04/20  Yes [name], PMHNP  gabapentin (NEURONTIN) 600 MG tablet Take 600 mg by mouth 3 (three) times daily. 05/12/19  Yes [provider]  hyoscyamine (LEVSIN) 0.125 MG tablet Take 0.125 mg by mouth every 4 (four) hours as needed for bladder spasms.  11/28/19  Yes [provider]  Insulin Glargine (LANTUS SOLOSTAR) 100 UNIT/ML Solostar Pen Inject 54 units under the skin once daily. Patient taking differently: Inject 56 Units into the skin at bedtime. 05/05/19  Yes Elayne Snare, MD  levothyroxine (SYNTHROID) 75 MCG tablet TAKE 1 TABLET BY MOUTH EVERY DAY BEFORE BREAKFAST Patient taking differently: Take 75 mcg by mouth daily before breakfast. 02/22/20  Yes Elayne Snare, MD  lithium carbonate (ESKALITH) 450 MG CR tablet Take 1 tablet (450 mg total) by mouth at bedtime. 02/21/20  Yes [name], PMHNP  loperamide (IMODIUM A-D) 2 MG tablet Take 2 mg by mouth 2 (two) times daily as needed for diarrhea or loose stools.   Yes [provider]  losartan (COZAAR) 25 MG tablet Take 12.5 mg by mouth daily. 08/31/19  Yes [provider]  Melatonin 10 MG TBCR Take 10 mg by mouth at bedtime.   Yes [provider]  memantine (NAMENDA) 10 MG tablet TAKE 1 TABLET(10 MG) BY MOUTH EVERY EVENING Patient taking differently: Take 10 mg by mouth daily. 01/04/20  Yes [name], PMHNP  methocarbamol (ROBAXIN) 500 MG tablet Take 1 tablet (500 mg total) by mouth every 6 (six) hours as needed for muscle spasms. 02/08/19  Yes Babish, Rodman Key, PA-C  nitroGLYCERIN (NITROSTAT) 0.4 MG SL tablet Place 1 tablet (0.4 mg total) under the tongue every 5 (five) minutes as needed for chest pain. 09/14/17  Yes Rosita Fire, Brittainy M, PA-C  ondansetron (ZOFRAN) 4 MG tablet Take 4 mg by mouth every 8 (eight) hours as needed for nausea or vomiting.  11/26/19  Yes [provider]  oxyCODONE (OXY IR/ROXICODONE) 5 MG immediate release tablet Take 1 tablet (5 mg total) by mouth every 4 (four) hours as needed for severe pain. 12/10/19  Yes Dwyane Dee, MD  pantoprazole (PROTONIX) 40 MG tablet Take 1 tablet (40 mg total) by mouth daily. 12/10/19  Yes Dwyane Dee, MD  propranolol (INDERAL) 10 MG tablet Take 1-2 tabs po TID prn anxiety Patient taking differently: Take 10-20 mg by mouth 3 (three) times daily as needed (anxiety). 03/25/20  Yes [name], PMHNP  QUEtiapine (SEROQUEL) 200 MG tablet TAKE 1 TAB PO q 5 pm and 1 tab po QHS Patient taking differently: Take 200 mg by mouth See admin instructions. Take 1 tablet at 5 pm and 1 tablet at bedtime 01/04/20  Yes [name], PMHNP  Semaglutide, 1 MG/DOSE, (OZEMPIC, 1 MG/DOSE,) 4 MG/3ML SOPN Inject 1 mg into the skin once a week. Take on Saturday 02/14/20  Yes Elayne Snare, MD  BD PEN NEEDLE NANO 2ND GEN 32G X 4 MM MISC USE TO INJECT 5 TIMES DAILY AS DIRECTED 10/17/19   Elayne Snare, MD  clonazePAM (KLONOPIN) 2 MG tablet Take 1 tablet (2 mg total) by mouth 3 (three) times daily as needed for anxiety. Do not combine with Xanax 03/25/20   [name], PMHNP  glucose blood (CONTOUR  NEXT TEST) test strip Use to test blood sugar 2 times daily 04/28/18   Elayne Snare, MD  Microlet Lancets MISC USE DAILY AS DIRECTED 10/24/18   Elayne Snare, MD  rOPINIRole (REQUIP) 0.5 MG tablet Takes 4 tabs at 5 pm and 3 tabs at bedtime and 1-2 tabs prn Patient taking differently: Take 0.5-2 mg by mouth See admin instructions. Takes 4 tabs at 5 pm and 3 tabs at bedtime and 1-2 tabs as needed for RLS 01/04/20   [name], PMHNP    Allergies    Codeine, Macrolides and ketolides, Procaine hcl, Aspirin, Benadryl [diphenhydramine], Diflucan [fluconazole], Dilaudid [hydromorphone hcl], Diphenhydramine hcl, Hydrocodone, Other, Procaine, Sulfamethoxazole, Sulfamethoxazole-trimethoprim, Tramadol hcl, and Sulfa antibiotics  Review of Systems   Review of Systems  Constitutional: Negative for chills and fever.  HENT: Negative for ear pain and sore throat.   Eyes: Negative for pain and visual disturbance.  Respiratory: Negative for cough and shortness of breath.   Cardiovascular: Negative for chest pain and palpitations.  Gastrointestinal: Negative for abdominal pain and vomiting.  Genitourinary: Negative for dysuria and hematuria.  Musculoskeletal: Negative for arthralgias and back pain.  Skin: Negative for color change and rash.  Neurological: Negative for seizures and syncope.  Psychiatric/Behavioral: Positive for dysphoric mood.  All other systems reviewed and are negative.   Physical Exam Updated Vital Signs BP 134/70   Pulse 66   Temp 98.1 F (36.7 C) (Oral)   Resp 17   SpO2 94%   Physical Exam Vitals and nursing note reviewed.  Constitutional:      General: She is not in acute distress.    Appearance: She is well-developed and well-nourished.  HENT:     Head: Normocephalic and atraumatic.  Eyes:     Conjunctiva/sclera: Conjunctivae normal.  Cardiovascular:     Rate and Rhythm: Normal rate and regular rhythm.     Heart sounds: No murmur heard.   Pulmonary:     Effort:  Pulmonary effort is normal. No respiratory distress.     Breath sounds: Normal breath sounds.  Abdominal:     Palpations: Abdomen is soft.     Tenderness: There is no abdominal tenderness.  Musculoskeletal:        General: No edema.     Cervical back: Neck supple.  Skin:    General: Skin is warm and dry.  Neurological:     Mental Status: She is alert.     Comments: Alert and oriented  Psychiatric:        Mood and Affect: Mood and affect normal.     Comments: Flattened affect     ED Results / Procedures / Treatments   Labs (all labs ordered are listed, but only abnormal results are displayed) Labs Reviewed  COMPREHENSIVE METABOLIC PANEL - Abnormal; Notable for the following components:      Result Value   Glucose, Bld 161 (*)    Creatinine, Ser 1.03 (*)    GFR, Estimated 58 (*)    All other components within normal limits  SALICYLATE LEVEL - Abnormal; Notable for the following components:   Salicylate Lvl [id number] (*)    All other components within normal limits  ACETAMINOPHEN LEVEL - Abnormal; Notable for the following components:   Acetaminophen (Tylenol), Serum <10 (*)    All other components within normal limits  CBC - Abnormal; Notable for the following components:   WBC 12.6 (*)    RBC 5.18 (*)    Hemoglobin 15.3 (*)  HCT 48.5 (*)    All other components within normal limits  ETHANOL  RAPID URINE DRUG SCREEN, HOSP PERFORMED    EKG EKG Interpretation  Date/Time:  Thursday March 28 2020 21:31:13 EST Ventricular Rate:  78 PR Interval:    QRS Duration: 86 QT Interval:  379 QTC Calculation: 432 R Axis:   68 Text Interpretation: Sinus rhythm Low voltage, precordial leads 12 Lead; [place] Confirmed by Madalyn Rob [phone]) on 03/28/2020 9:42:43 PM   Radiology No results found.  Procedures Procedures (including critical care time)  Medications Ordered in ED Medications - No data to display  ED Course  I have reviewed the triage vital signs and  the nursing notes.  Pertinent labs & imaging results that were available during my care of the patient were reviewed by me and considered in my medical decision making (see chart for details).  Clinical Course as of 03/29/20 0006  Thu Mar 28, 2020  2048 3 propranolol, 3 klonipin [RD]    Clinical Course User Index [RD] Lucrezia Starch, MD   MDM Rules/Calculators/A&P                         75 year old lady presents to ER with concern for suicidal ideation.  Did endorse taking a couple extra propranolol and Klonopin.  Currently she is well-appearing with no acute medical complaints with normal vital signs.  Medically cleared for psychiatric eval. TTS recommending inpatient criteria. Board in ER awaiting psych placement.    Final Clinical Impression(s) / ED Diagnoses Final diagnoses:  Suicidal ideation    Rx / DC Orders ED Discharge Orders    None       Lucrezia Starch, MD 03/29/20 0009

## 2020-03-29 NOTE — Discharge Instructions (Signed)
Go to the facility found to try and help you with your thoughts of hurting yourself.

## 2020-03-29 NOTE — BH Assessment (Signed)
[facility] Assessment Progress Note  Per Lindon Romp, NP, this voluntary pt requires psychiatric hospitalization at this time.  At 12:44 Cedric calls from [facility]  Pt has been accepted to their facility by Dr Brantley Fling.  Sudley, DO, concurs with this disposition, as does the pt.  Dr Tyrone Nine and pt's nurse, Cleotis Lema, have been notified, and Cleotis Lema agrees to call report to [phone].  Pt is to be transported via [facility].  Jalene Mullet, [place] Coordinator [phone]

## 2020-03-29 NOTE — BH Assessment (Addendum)
[facility] Assessment Progress Note  Per Lindon Romp, NP, this pt requires psychiatric hospitalization at this time.  The following facilities have been contacted to seek placement for this pt, with results as noted:  Beds available, information sent, decision pending: [facility] (bed availability uncertain) [facility]  Unable to reach: [facility] (left message at 11:46)  At capacity: [facility]   If this voluntary pt is accepted to a facility, please discuss disposition with pt to be sure that they agree to the plan.  If a facility agrees to accept pt and the plan changes in any way please call the facility to inform them of the change.  Final disposition is pending as of this writing.  Jalene Mullet, [place] Coordinator [phone]

## 2020-04-04 ENCOUNTER — Telehealth (INDEPENDENT_AMBULATORY_CARE_PROVIDER_SITE_OTHER): Payer: Medicare Other | Admitting: Psychiatry

## 2020-04-04 ENCOUNTER — Encounter: Payer: Self-pay | Admitting: Psychiatry

## 2020-04-04 DIAGNOSIS — F5101 Primary insomnia: Secondary | ICD-10-CM | POA: Diagnosis not present

## 2020-04-04 DIAGNOSIS — F411 Generalized anxiety disorder: Secondary | ICD-10-CM

## 2020-04-04 DIAGNOSIS — F319 Bipolar disorder, unspecified: Secondary | ICD-10-CM
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
## 2020-04-05 ENCOUNTER — Telehealth: Payer: Self-pay | Admitting: Psychiatry

## 2020-04-05 DIAGNOSIS — F319 Bipolar disorder, unspecified: Secondary | ICD-10-CM | POA: Diagnosis not present

## 2020-04-05 DIAGNOSIS — R5383 Other fatigue: Secondary | ICD-10-CM | POA: Diagnosis not present

## 2020-04-05 DIAGNOSIS — E1165 Type 2 diabetes mellitus with hyperglycemia: Secondary | ICD-10-CM | POA: Diagnosis not present

## 2020-04-05 DIAGNOSIS — R45851 Suicidal ideations: Secondary | ICD-10-CM | POA: Diagnosis not present

## 2020-04-05 DIAGNOSIS — Z09 Encounter for follow-up examination after completed treatment for conditions other than malignant neoplasm: Secondary | ICD-10-CM | POA: Diagnosis not present

## 2020-04-05 NOTE — Telephone Encounter (Signed)
Returned call to patient. She reports that she fell again last night around 11:30 pm after taking medication at 10 am. She reports that she did not take Ambien last night and took Klonopin.  She reports that she was not able to sleep last night. She reports feeling unsteady this morning.   She reports that she remains weak.  She reports that she continues to feel "very anxious, really depressed." She reports continued confusion.   Had virtual visit with PCP today.   Taking medication for UTI. Reports urinary frequency.   She requests a call to her daughter.   PLAN:  -Stop Klonopin due to excessive somnolence or unsteadiness -Resume Ambien at bedtime.  -Take Xanax prn for anxiety.

## 2020-04-05 NOTE — Telephone Encounter (Signed)
Called pt's daughter, Carmon Sails, at pt's request. Discussed recent symptoms and response to treatment. Discussed that pt was having difficulty with sleep and Klonopin was partially effective for her insomnia and now seems to be causing excessive somnolence, unsteadiness, and slurred speech. Plan is to stop Klonopin and resume Xanax prn during the day for anxiety and Ambien at HS.   Discussed that pt has had a UTI and culture apparently indicated that infection was not sensitive to first antibiotic prescribed and she was prescribed Augmentin to target infection and has recently started this. Discussed that UTI could also be contributing to confusion, lethargy, and possible worsening mood and anxiety.   Daughter reports that pt has been "manipulative" with her and her sister. Daughter reports that pt has said hurtful things. Daughter reports that pt was having difficulty finding words on the phone today.  Daughter reports that she and her sister have noticed her speech has been more slurred and she has seemed more sleepy for about the last 2 weeks.   Discussed considering hospitalization to a gero-psych unit if no significant improvement in several days after transitioning off Klonopin and with tx of underlying UTI.   Advised daughter to contact office if they have any questions or concerns.

## 2020-04-05 NOTE — Telephone Encounter (Signed)
Pt called and wanted you to know that she fell again last night. Pt said that she did not break any bones, but wanted you to know. Please call.

## 2020-04-07 ENCOUNTER — Other Ambulatory Visit: Payer: Self-pay | Admitting: Endocrinology

## 2020-04-08 ENCOUNTER — Telehealth: Payer: Self-pay | Admitting: Psychiatry

## 2020-04-08 ENCOUNTER — Ambulatory Visit: Payer: Medicare Other | Admitting: Endocrinology

## 2020-04-08 DIAGNOSIS — F3132 Bipolar disorder, current episode depressed, moderate: Secondary | ICD-10-CM | POA: Diagnosis not present

## 2020-04-08 DIAGNOSIS — E785 Hyperlipidemia, unspecified: Secondary | ICD-10-CM | POA: Diagnosis not present

## 2020-04-08 DIAGNOSIS — R45851 Suicidal ideations: Secondary | ICD-10-CM | POA: Diagnosis not present

## 2020-04-08 DIAGNOSIS — F319 Bipolar disorder, unspecified: Secondary | ICD-10-CM | POA: Diagnosis not present

## 2020-04-08 DIAGNOSIS — R4182 Altered mental status, unspecified: Secondary | ICD-10-CM | POA: Diagnosis not present

## 2020-04-08 DIAGNOSIS — E538 Deficiency of other specified B group vitamins: Secondary | ICD-10-CM | POA: Diagnosis not present

## 2020-04-08 DIAGNOSIS — Z20822 Contact with and (suspected) exposure to covid-19: Secondary | ICD-10-CM | POA: Diagnosis not present

## 2020-04-08 DIAGNOSIS — E039 Hypothyroidism, unspecified: Secondary | ICD-10-CM | POA: Diagnosis not present

## 2020-04-08 DIAGNOSIS — G2581 Restless legs syndrome: Secondary | ICD-10-CM | POA: Diagnosis not present

## 2020-04-08 DIAGNOSIS — K5190 Ulcerative colitis, unspecified, without complications: Secondary | ICD-10-CM | POA: Diagnosis not present

## 2020-04-08 NOTE — Telephone Encounter (Signed)
Attempted to call pt to follow-up on how she is doing. L/M for patient

## 2020-04-09 ENCOUNTER — Telehealth: Payer: Medicare Other | Admitting: Psychiatry

## 2020-04-09 DIAGNOSIS — F319 Bipolar disorder, unspecified: Secondary | ICD-10-CM | POA: Diagnosis not present

## 2020-04-09 DIAGNOSIS — E039 Hypothyroidism, unspecified: Secondary | ICD-10-CM | POA: Diagnosis not present

## 2020-04-09 DIAGNOSIS — K5190 Ulcerative colitis, unspecified, without complications: Secondary | ICD-10-CM | POA: Diagnosis present

## 2020-04-09 DIAGNOSIS — E0800 Diabetes mellitus due to underlying condition with hyperosmolarity without nonketotic hyperglycemic-hyperosmolar coma (NKHHC): Secondary | ICD-10-CM | POA: Diagnosis not present

## 2020-04-09 DIAGNOSIS — E119 Type 2 diabetes mellitus without complications: Secondary | ICD-10-CM | POA: Diagnosis present

## 2020-04-09 DIAGNOSIS — E559 Vitamin D deficiency, unspecified: Secondary | ICD-10-CM | POA: Diagnosis not present

## 2020-04-09 DIAGNOSIS — K51919 Ulcerative colitis, unspecified with unspecified complications: Secondary | ICD-10-CM | POA: Diagnosis not present

## 2020-04-09 DIAGNOSIS — G2581 Restless legs syndrome: Secondary | ICD-10-CM | POA: Diagnosis not present

## 2020-04-09 DIAGNOSIS — F3110 Bipolar disorder, current episode manic without psychotic features, unspecified: Secondary | ICD-10-CM | POA: Diagnosis not present

## 2020-04-09 DIAGNOSIS — E785 Hyperlipidemia, unspecified: Secondary | ICD-10-CM | POA: Diagnosis not present

## 2020-04-09 DIAGNOSIS — I10 Essential (primary) hypertension: Secondary | ICD-10-CM | POA: Diagnosis not present

## 2020-04-09 DIAGNOSIS — E538 Deficiency of other specified B group vitamins: Secondary | ICD-10-CM | POA: Diagnosis not present

## 2020-04-09 DIAGNOSIS — R45851 Suicidal ideations: Secondary | ICD-10-CM | POA: Diagnosis not present

## 2020-04-09 DIAGNOSIS — Z79899 Other long term (current) drug therapy: Secondary | ICD-10-CM | POA: Diagnosis not present

## 2020-04-10 DIAGNOSIS — E559 Vitamin D deficiency, unspecified: Secondary | ICD-10-CM | POA: Diagnosis not present

## 2020-04-10 DIAGNOSIS — F3110 Bipolar disorder, current episode manic without psychotic features, unspecified: Secondary | ICD-10-CM | POA: Diagnosis not present

## 2020-04-10 DIAGNOSIS — E0800 Diabetes mellitus due to underlying condition with hyperosmolarity without nonketotic hyperglycemic-hyperosmolar coma (NKHHC): Secondary | ICD-10-CM | POA: Diagnosis not present

## 2020-04-10 DIAGNOSIS — G2581 Restless legs syndrome: Secondary | ICD-10-CM | POA: Diagnosis not present

## 2020-04-10 DIAGNOSIS — E785 Hyperlipidemia, unspecified: Secondary | ICD-10-CM | POA: Diagnosis not present

## 2020-04-10 DIAGNOSIS — E538 Deficiency of other specified B group vitamins: Secondary | ICD-10-CM | POA: Diagnosis not present

## 2020-04-10 DIAGNOSIS — K51919 Ulcerative colitis, unspecified with unspecified complications: Secondary | ICD-10-CM | POA: Diagnosis not present

## 2020-04-10 DIAGNOSIS — F319 Bipolar disorder, unspecified: Secondary | ICD-10-CM | POA: Diagnosis not present

## 2020-04-10 DIAGNOSIS — I10 Essential (primary) hypertension: Secondary | ICD-10-CM | POA: Diagnosis not present

## 2020-04-10 DIAGNOSIS — E039 Hypothyroidism, unspecified: Secondary | ICD-10-CM | POA: Diagnosis not present

## 2020-04-11 DIAGNOSIS — F3110 Bipolar disorder, current episode manic without psychotic features, unspecified: Secondary | ICD-10-CM | POA: Diagnosis not present

## 2020-04-12 DIAGNOSIS — I10 Essential (primary) hypertension: Secondary | ICD-10-CM | POA: Diagnosis not present

## 2020-04-12 DIAGNOSIS — E0800 Diabetes mellitus due to underlying condition with hyperosmolarity without nonketotic hyperglycemic-hyperosmolar coma (NKHHC): Secondary | ICD-10-CM | POA: Diagnosis not present

## 2020-04-12 DIAGNOSIS — E559 Vitamin D deficiency, unspecified: Secondary | ICD-10-CM | POA: Diagnosis not present

## 2020-04-12 DIAGNOSIS — F3110 Bipolar disorder, current episode manic without psychotic features, unspecified: Secondary | ICD-10-CM | POA: Diagnosis not present

## 2020-04-16 ENCOUNTER — Ambulatory Visit (INDEPENDENT_AMBULATORY_CARE_PROVIDER_SITE_OTHER): Payer: Medicare Other | Admitting: Psychiatry

## 2020-04-16 ENCOUNTER — Encounter: Payer: Self-pay | Admitting: Psychiatry

## 2020-04-16 ENCOUNTER — Other Ambulatory Visit: Payer: Self-pay

## 2020-04-16 DIAGNOSIS — F319 Bipolar disorder, unspecified: Secondary | ICD-10-CM | POA: Diagnosis not present

## 2020-04-16 DIAGNOSIS — F411 Generalized anxiety disorder: Secondary | ICD-10-CM

## 2020-04-16 DIAGNOSIS — F5101 Primary insomnia: Secondary | ICD-10-CM | POA: Diagnosis not present
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
## 2020-04-17 ENCOUNTER — Other Ambulatory Visit: Payer: Self-pay | Admitting: Family Medicine

## 2020-04-17 DIAGNOSIS — E041 Nontoxic single thyroid nodule: Secondary | ICD-10-CM

## 2020-04-19 DIAGNOSIS — Z09 Encounter for follow-up examination after completed treatment for conditions other than malignant neoplasm: Secondary | ICD-10-CM | POA: Diagnosis not present

## 2020-04-19 DIAGNOSIS — E559 Vitamin D deficiency, unspecified: Secondary | ICD-10-CM | POA: Diagnosis not present

## 2020-04-19 DIAGNOSIS — E2839 Other primary ovarian failure: Secondary | ICD-10-CM | POA: Diagnosis not present

## 2020-04-19 DIAGNOSIS — Z0000 Encounter for general adult medical examination without abnormal findings: Secondary | ICD-10-CM | POA: Diagnosis not present

## 2020-04-19 DIAGNOSIS — F319 Bipolar disorder, unspecified: Secondary | ICD-10-CM | POA: Diagnosis not present

## 2020-04-19 DIAGNOSIS — R300 Dysuria: Secondary | ICD-10-CM | POA: Diagnosis not present

## 2020-04-19 DIAGNOSIS — E039 Hypothyroidism, unspecified: Secondary | ICD-10-CM | POA: Diagnosis not present

## 2020-04-19 DIAGNOSIS — I10 Essential (primary) hypertension: Secondary | ICD-10-CM | POA: Diagnosis not present

## 2020-04-19 DIAGNOSIS — E785 Hyperlipidemia, unspecified: Secondary | ICD-10-CM | POA: Diagnosis not present

## 2020-04-19 DIAGNOSIS — E1165 Type 2 diabetes mellitus with hyperglycemia: Secondary | ICD-10-CM | POA: Diagnosis not present

## 2020-04-19 DIAGNOSIS — Z1239 Encounter for other screening for malignant neoplasm of breast: Secondary | ICD-10-CM | POA: Diagnosis not present

## 2020-04-19 DIAGNOSIS — R350 Frequency of micturition: Secondary | ICD-10-CM | POA: Diagnosis not present

## 2020-04-22 ENCOUNTER — Other Ambulatory Visit: Payer: Self-pay | Admitting: Family Medicine

## 2020-04-22 DIAGNOSIS — E2839 Other primary ovarian failure: Secondary | ICD-10-CM

## 2020-04-23 ENCOUNTER — Other Ambulatory Visit: Payer: Self-pay | Admitting: Family Medicine

## 2020-04-23 ENCOUNTER — Other Ambulatory Visit: Payer: Self-pay

## 2020-04-23 ENCOUNTER — Encounter: Payer: Self-pay | Admitting: Psychiatry

## 2020-04-23 ENCOUNTER — Ambulatory Visit (INDEPENDENT_AMBULATORY_CARE_PROVIDER_SITE_OTHER): Payer: Medicare Other | Admitting: Psychiatry

## 2020-04-23 DIAGNOSIS — F319 Bipolar disorder, unspecified: Secondary | ICD-10-CM

## 2020-04-23 DIAGNOSIS — Z1231 Encounter for screening mammogram for malignant neoplasm of breast: Secondary | ICD-10-CM

## 2020-04-23 DIAGNOSIS — F411 Generalized anxiety disorder: Secondary | ICD-10-CM | POA: Diagnosis not present

## 2020-04-23 DIAGNOSIS — F5101 Primary insomnia: Secondary | ICD-10-CM | POA: Diagnosis not present

## 2020-04-23 MED ORDER — DONEPEZIL HCL 10 MG PO TABS: 10.0000 mg | ORAL_TABLET | Freq: Every day | ORAL | 0 refills | Status: DC

## 2020-04-23 MED ORDER — DIVALPROEX SODIUM 250 MG PO DR TAB: 500.0000 mg | DELAYED_RELEASE_TABLET | Freq: Every day | ORAL | 0 refills | Status: DC

## 2020-04-23 MED ORDER — MEMANTINE HCL 10 MG PO TABS: ORAL_TABLET | ORAL | 0 refills | Status: DC

## 2020-04-23 MED ORDER — QUETIAPINE FUMARATE 200 MG PO TABS: ORAL_TABLET | ORAL | 0 refills | Status: DC

## 2020-04-23 MED ORDER — MIRTAZAPINE 15 MG PO TABS: 15.0000 mg | ORAL_TABLET | Freq: Every day | ORAL | 1 refills | Status: DC
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
## 2020-04-23 NOTE — Progress Notes (Signed)
   04/23/20 1625  Facial and Oral Movements  Muscles of Facial Expression 0  Lips and Perioral Area 0  Jaw 0  Tongue 0  Extremity Movements  Upper (arms, wrists, hands, fingers) 0  Lower (legs, knees, ankles, toes) 0  Trunk Movements  Neck, shoulders, hips 0  Overall Severity  Severity of abnormal movements (highest score from questions above) 0  Incapacitation due to abnormal movements 0  Patient's awareness of abnormal movements (rate only patient's report) 0  AIMS Total Score  AIMS Total Score 0

## 2020-04-29 ENCOUNTER — Ambulatory Visit
Admission: RE | Admit: 2020-04-29 | Discharge: 2020-04-29 | Disposition: A | Discharge status: Home / Self Care | Payer: Medicare Other | Source: Ambulatory Visit | Attending: Family Medicine | Admitting: Family Medicine

## 2020-04-29 ENCOUNTER — Other Ambulatory Visit: Payer: Self-pay

## 2020-04-29 DIAGNOSIS — E041 Nontoxic single thyroid nodule: Secondary | ICD-10-CM

## 2020-04-30 ENCOUNTER — Other Ambulatory Visit: Payer: Self-pay | Admitting: Endocrinology

## 2020-05-01 DIAGNOSIS — N390 Urinary tract infection, site not specified: Secondary | ICD-10-CM | POA: Diagnosis not present

## 2020-05-02 ENCOUNTER — Telehealth (INDEPENDENT_AMBULATORY_CARE_PROVIDER_SITE_OTHER): Payer: Medicare Other | Admitting: Psychiatry

## 2020-05-02 ENCOUNTER — Encounter: Payer: Self-pay | Admitting: Psychiatry

## 2020-05-02 DIAGNOSIS — F5101 Primary insomnia: Secondary | ICD-10-CM | POA: Diagnosis not present

## 2020-05-02 DIAGNOSIS — F319 Bipolar disorder, unspecified: Secondary | ICD-10-CM | POA: Diagnosis not present

## 2020-05-02 DIAGNOSIS — F411 Generalized anxiety disorder: Secondary | ICD-10-CM | POA: Diagnosis not present

## 2020-05-02 MED ORDER — MIRTAZAPINE 15 MG PO TABS: 30.0000 mg | ORAL_TABLET | Freq: Every day | ORAL | 1 refills | Status: DC
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
## 2020-05-06 ENCOUNTER — Other Ambulatory Visit: Payer: Self-pay | Admitting: Family Medicine

## 2020-05-06 DIAGNOSIS — E041 Nontoxic single thyroid nodule: Secondary | ICD-10-CM

## 2020-05-21 ENCOUNTER — Other Ambulatory Visit (HOSPITAL_COMMUNITY)
Admission: RE | Admit: 2020-05-21 | Discharge: 2020-05-21 | Disposition: A | Discharge status: Home / Self Care | Payer: Medicare Other | Source: Ambulatory Visit | Attending: Family Medicine | Admitting: Family Medicine

## 2020-05-21 ENCOUNTER — Ambulatory Visit
Admission: RE | Admit: 2020-05-21 | Discharge: 2020-05-21 | Disposition: A | Discharge status: Home / Self Care | Payer: Medicare Other | Source: Ambulatory Visit | Attending: Family Medicine | Admitting: Family Medicine

## 2020-05-21 DIAGNOSIS — E041 Nontoxic single thyroid nodule: Secondary | ICD-10-CM | POA: Diagnosis not present

## 2020-05-21 DIAGNOSIS — D34 Benign neoplasm of thyroid gland: Secondary | ICD-10-CM | POA: Diagnosis not present

## 2020-05-22 ENCOUNTER — Encounter: Payer: Self-pay | Admitting: Psychiatry

## 2020-05-22 ENCOUNTER — Ambulatory Visit (INDEPENDENT_AMBULATORY_CARE_PROVIDER_SITE_OTHER): Payer: Medicare Other | Admitting: Psychiatry

## 2020-05-22 ENCOUNTER — Other Ambulatory Visit: Payer: Self-pay

## 2020-05-22 DIAGNOSIS — F319 Bipolar disorder, unspecified: Secondary | ICD-10-CM | POA: Diagnosis not present

## 2020-05-22 DIAGNOSIS — F5101 Primary insomnia: Secondary | ICD-10-CM | POA: Diagnosis not present

## 2020-05-22 DIAGNOSIS — F411 Generalized anxiety disorder: Secondary | ICD-10-CM | POA: Diagnosis not present

## 2020-05-22 MED ORDER — CLONAZEPAM 1 MG PO TABS: 1.0000 mg | ORAL_TABLET | Freq: Every day | ORAL | 0 refills | Status: DC

## 2020-05-22 MED ORDER — FANAPT 1 MG PO TABS: 1.0000 mg | ORAL_TABLET | Freq: Every day | ORAL | 0 refills | Status: DC
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
## 2020-05-23 LAB — CYTOLOGY - NON PAP

## 2020-05-27 ENCOUNTER — Telehealth: Payer: Self-pay | Admitting: Endocrinology

## 2020-05-27 NOTE — Telephone Encounter (Signed)
Pt called requesting a call at [phone] for her High Blood Sugars. Yesterday pt states they were 280 and just a little bit ago they were 169. Pt states she has no symptoms, just very tired.

## 2020-05-27 NOTE — Telephone Encounter (Signed)
Pt called back, her blood sugars are 280 now

## 2020-05-28 NOTE — Telephone Encounter (Signed)
Please find out if she is taking her medications as well as whether she is getting any steroids.  She will need to be seen in the office as soon as possible for overdue visit.  Also need to know what time of the day her blood sugars are high

## 2020-05-28 NOTE — Telephone Encounter (Signed)
Please advise 

## 2020-05-29 ENCOUNTER — Other Ambulatory Visit: Payer: Self-pay | Admitting: Gastroenterology

## 2020-05-29 ENCOUNTER — Encounter (HOSPITAL_COMMUNITY): Payer: Self-pay

## 2020-05-29 ENCOUNTER — Emergency Department (HOSPITAL_COMMUNITY)
Admission: EM | Admit: 2020-05-29 | Discharge: 2020-05-29 | Disposition: A | Discharge status: Home / Self Care | Payer: Medicare Other | Attending: Emergency Medicine | Admitting: Emergency Medicine

## 2020-05-29 ENCOUNTER — Emergency Department (HOSPITAL_COMMUNITY): Payer: Medicare Other

## 2020-05-29 ENCOUNTER — Other Ambulatory Visit: Payer: Self-pay

## 2020-05-29 DIAGNOSIS — K219 Gastro-esophageal reflux disease without esophagitis: Secondary | ICD-10-CM | POA: Diagnosis not present

## 2020-05-29 DIAGNOSIS — Z96652 Presence of left artificial knee joint: Secondary | ICD-10-CM | POA: Insufficient documentation

## 2020-05-29 DIAGNOSIS — Z79899 Other long term (current) drug therapy: Secondary | ICD-10-CM | POA: Diagnosis not present

## 2020-05-29 DIAGNOSIS — R6884 Jaw pain: Secondary | ICD-10-CM | POA: Insufficient documentation

## 2020-05-29 DIAGNOSIS — R0789 Other chest pain: Secondary | ICD-10-CM

## 2020-05-29 DIAGNOSIS — R1084 Generalized abdominal pain: Secondary | ICD-10-CM

## 2020-05-29 DIAGNOSIS — K529 Noninfective gastroenteritis and colitis, unspecified: Secondary | ICD-10-CM | POA: Diagnosis not present

## 2020-05-29 DIAGNOSIS — I10 Essential (primary) hypertension: Secondary | ICD-10-CM | POA: Insufficient documentation

## 2020-05-29 DIAGNOSIS — R1310 Dysphagia, unspecified: Secondary | ICD-10-CM

## 2020-05-29 DIAGNOSIS — R109 Unspecified abdominal pain: Secondary | ICD-10-CM

## 2020-05-29 DIAGNOSIS — E1140 Type 2 diabetes mellitus with diabetic neuropathy, unspecified: Secondary | ICD-10-CM | POA: Insufficient documentation

## 2020-05-29 DIAGNOSIS — Z794 Long term (current) use of insulin: Secondary | ICD-10-CM | POA: Diagnosis not present

## 2020-05-29 DIAGNOSIS — R1013 Epigastric pain: Secondary | ICD-10-CM | POA: Diagnosis not present

## 2020-05-29 DIAGNOSIS — E039 Hypothyroidism, unspecified: Secondary | ICD-10-CM | POA: Diagnosis not present

## 2020-05-29 DIAGNOSIS — Z96641 Presence of right artificial hip joint: Secondary | ICD-10-CM | POA: Insufficient documentation

## 2020-05-29 DIAGNOSIS — R079 Chest pain, unspecified: Secondary | ICD-10-CM | POA: Diagnosis not present

## 2020-05-29 LAB — CBC WITH DIFFERENTIAL/PLATELET
Abs Immature Granulocytes: 0.08 10*3/uL — ABNORMAL HIGH (ref 0.00–0.07)
Basophils Absolute: 0 10*3/uL (ref 0.0–0.1)
Basophils Relative: 0 %
Eosinophils Absolute: 0.3 10*3/uL (ref 0.0–0.5)
Eosinophils Relative: 3 %
HCT: 45.9 % (ref 36.0–46.0)
Hemoglobin: 14.3 g/dL (ref 12.0–15.0)
Immature Granulocytes: 1 %
Lymphocytes Relative: 18 %
Lymphs Abs: 1.7 10*3/uL (ref 0.7–4.0)
MCH: 29.3 pg (ref 26.0–34.0)
MCHC: 31.2 g/dL (ref 30.0–36.0)
MCV: 94.1 fL (ref 80.0–100.0)
Monocytes Absolute: 0.7 10*3/uL (ref 0.1–1.0)
Monocytes Relative: 7 %
Neutro Abs: 6.6 10*3/uL (ref 1.7–7.7)
Neutrophils Relative %: 71 %
Platelets: 175 10*3/uL (ref 150–400)
RBC: 4.88 MIL/uL (ref 3.87–5.11)
RDW: 13.9 % (ref 11.5–15.5)
WBC: 9.3 10*3/uL (ref 4.0–10.5)
nRBC: 0 % (ref 0.0–0.2)

## 2020-05-29 LAB — BASIC METABOLIC PANEL
Anion gap: 9 (ref 5–15)
BUN: 14 mg/dL (ref 8–23)
CO2: 24 mmol/L (ref 22–32)
Calcium: 10.1 mg/dL (ref 8.9–10.3)
Chloride: 105 mmol/L (ref 98–111)
Creatinine, Ser: 0.89 mg/dL (ref 0.44–1.00)
GFR, Estimated: >60 mL/min (ref >60)
Glucose, Bld: 203 mg/dL — ABNORMAL HIGH (ref 70–99)
Potassium: 3.9 mmol/L (ref 3.5–5.1)
Sodium: 138 mmol/L (ref 135–145)

## 2020-05-29 LAB — TROPONIN I (HIGH SENSITIVITY): Troponin I (High Sensitivity): 3 ng/L (ref <18)

## 2020-05-29 MED ORDER — FAMOTIDINE IN NACL 20-0.9 MG/50ML-% IV SOLN
20.0000 mg | INTRAVENOUS | Status: AC
  Administered 2020-05-29: 20 mg via INTRAVENOUS
  Filled 2020-05-29: qty 50

## 2020-05-29 MED ORDER — FAMOTIDINE 20 MG PO TABS: 20.0000 mg | ORAL_TABLET | Freq: Two times a day (BID) | ORAL | 0 refills | Status: DC

## 2020-05-29 NOTE — Telephone Encounter (Signed)
Sure I can see her

## 2020-05-29 NOTE — Telephone Encounter (Signed)
LMTCB to schedule an appt with Dr. Kelton Pillar

## 2020-05-29 NOTE — ED Provider Notes (Signed)
[facility] DEPT Provider Note   CSN: [id number] Arrival date & time: 05/29/20  0417     History Chief Complaint  Patient presents with  . Chest Pain    [name] [name] is a 75 y.o. female.  Patient presents to the emergency department with a chief complaint of chest pain.  She states she has been having the pain for the past 2 days.  She states it radiates to her jaw.  She denies feeling short of breath.  She reports associated difficulty swallowing food, and states that she has been taking Tums without relief.  She denies fever, chills, cough.  She denies any successful treatments prior to arrival.  Denies any other associated symptoms.  The history is provided by the patient. No language interpreter was used.       Past Medical History:  Diagnosis Date  . Alcohol abuse   . Anxiety    takes Valium daily as needed and Ativan daily  . Bilateral hearing loss   . Bipolar 1 disorder ([place])    takes Lithium nightly and Synthroid daily  . Chronic back pain    DDD; "all over" (09/14/2017)  . Colitis, ischemic ([place]) 2012  . Confusion    r/t meds  . Depression    takes Prozac daily and Bupspirone   . Diverticulosis   . Dyslipidemia    takes Crestor daily  . Fibromyalgia   . Headache    "weekly" (09/14/2017)  . Hepatic steatosis 06/18/12   severe  . Hyperlipidemia   . Hypertension   . Hypothyroidism   . IBS (irritable bowel syndrome)   . Ischemic colitis ([place])   . Joint pain   . Joint swelling   . Lupus erythematosus tumidus    tumid-skin  . Migraine    "1-2/yr; maybe" (09/14/2017)  . Numbness    in right foot  . Osteoarthritis    "all over" (09/14/2017)  . Osteoarthritis cervical spine   . Osteoarthritis of hand    bilateral  . Pneumonia    "walking pneumonia several times; long time since the last time" (09/14/2017)  . Restless leg syndrome    takes Requip nightly  . Sciatica   . Type II diabetes mellitus ([place])   . Urinary frequency    . Urinary leakage   . Urinary urgency   . Urinary, incontinence, stress female   . Walking pneumonia    last time more than 63yrs ago    Patient Active Problem List   Diagnosis Date Noted  . Bipolar 1 disorder, mixed, moderate ([place])   . Suicidal ideations   . Overweight (BMI 25.0-29.9) 02/08/2019  . S/P left TKA 02/07/2019  . Chest pain   . Chest pain with normal coronary angiography   . Diverticulitis 11/19/2016  . Hypothyroidism 07/14/2016  . Diarrhea   . Generalized abdominal pain   . Major depressive disorder, recurrent severe without psychotic features ([place]) 12/31/2015  . Nausea without vomiting   . Colitis 10/28/2015  . Unintentional poisoning by psychotropic drug 07/05/2015  . Restless leg syndrome 07/05/2015  . Diabetic polyneuropathy associated with type 2 diabetes mellitus ([place]) 09/21/2014  . Arthritis of left hip 06/09/2013  . Arthritis of right hip 06/08/2013  . Hip pain, right 05/02/2013  . IBS (irritable bowel syndrome) 01/02/2013  . Unspecified vitamin D deficiency 07/05/2012  . Pure hyperglyceridemia 07/04/2012  . Diabetes mellitus with diabetic neuropathy, without long-term current use of insulin ([place]) 06/18/2012  . Hypertension 06/18/2012  .  Bipolar 1 disorder ([place]) 06/18/2012  . Low back pain 02/25/2011  . Acute ischemic colitis ([place]) 01/06/2011  . Sciatica 12/19/2010  . Fibromyalgia 10/28/2010  . Primary osteoarthritis of right hip 10/24/2010  . Hyperlipidemia with target low density lipoprotein (LDL) cholesterol less than 100 mg/dL 05/05/2010  . DEPRESSION/ANXIETY 05/05/2010  . ABUSE, ALCOHOL, IN REMISSION 05/05/2010  . OTITIS MEDIA, CHRONIC 05/05/2010  . Hearing loss 05/05/2010  . ALLERGIC RHINITIS, SEASONAL, MILD 05/05/2010  . URINARY INCONTINENCE, STRESS, FEMALE 05/05/2010  . OSTEOARTHRITIS, HANDS, BILATERAL 05/05/2010  . OSTEOARTHRITIS, CERVICAL SPINE 05/05/2010    Past Surgical History:  Procedure Laterality Date  . ABDOMINAL  HYSTERECTOMY     "they left my ovaries"  . APPENDECTOMY    . CARDIAC CATHETERIZATION  09/14/2017  . COLONOSCOPY    . DENTAL SURGERY Left 10/2016   dental implant  . DILATION AND CURETTAGE OF UTERUS  X 4  . ESOPHAGOGASTRODUODENOSCOPY    . FLEXIBLE SIGMOIDOSCOPY N/A 06/21/2012   Procedure: FLEXIBLE SIGMOIDOSCOPY;  Surgeon: Jerene Bears, MD;  Location: [facility];  Service: Gastroenterology;  Laterality: N/A;  . JOINT REPLACEMENT    . LEFT HEART CATH AND CORONARY ANGIOGRAPHY N/A 09/14/2017   Procedure: LEFT HEART CATH AND CORONARY ANGIOGRAPHY;  Surgeon: Belva Crome, MD;  Location: [facility];  Service: Cardiovascular;  Laterality: N/A;  . SHOULDER ARTHROSCOPY Right    "shaved spurs off rotator cuff"  . TONSILLECTOMY    . TOTAL HIP ARTHROPLASTY Right 06/09/2013   Procedure: TOTAL HIP ARTHROPLASTY;  Surgeon: Kerin Salen, MD;  Location: [facility];  Service: Orthopedics;  Laterality: Right;  . TOTAL KNEE ARTHROPLASTY Left 02/07/2019   Procedure: TOTAL KNEE ARTHROPLASTY;  Surgeon: Paralee Cancel, MD;  Location: [facility];  Service: Orthopedics;  Laterality: Left;  70 mins  . TUBAL LIGATION    . TUMOR EXCISION Right 1968   angle of jaw; benign     OB History   No obstetric history on file.     Family History  Problem Relation Age of Onset  . Drug abuse Mother   . Alcohol abuse Mother   . Alcohol abuse Father   . Hypertension Father   . CAD Brother   . Hypertension Brother   . Alcohol abuse Brother   . Hypertension Brother   . Hypertension Brother   . Psoriasis Daughter   . Arthritis Daughter        psoriatic arthritis   . Alcohol abuse Grandchild     Social History   Tobacco Use  . Smoking status: Never Smoker  . Smokeless tobacco: Never Used  Vaping Use  . Vaping Use: Never used  Substance Use Topics  . Alcohol use: Not Currently    Comment: 09/14/2017 "nothing since 04/15/2008"  . Drug use: Not Currently    Frequency: 7.0 times per week    Types: Benzodiazepines     Home Medications Prior to Admission medications   Medication Sig Start Date End Date Taking? Authorizing Provider  acetaminophen (TYLENOL) 500 MG tablet Take 1,000 mg by mouth 3 (three) times daily as needed for moderate pain.     [provider]  ALPRAZolam Duanne Moron) 0.5 MG tablet Take 0.5 mg by mouth 4 (four) times daily as needed. 01/04/20   [provider]  atorvastatin (LIPITOR) 40 MG tablet Take 40 mg by mouth at bedtime.     [provider]  b complex vitamins capsule Take 1 capsule by mouth daily.    [provider]  BD PEN NEEDLE NANO 2ND GEN 32G X 4 MM MISC USE TO INJECT 5 TIMES DAILY AS DIRECTED 10/17/19   Elayne Snare, MD  celecoxib (CELEBREX) 200 MG capsule Take 200 mg by mouth at bedtime.    [provider]  cephALEXin (KEFLEX) 500 MG capsule Take 500 mg by mouth 2 (two) times daily. 04/24/20   [provider]  Cholecalciferol (VITAMIN D) 50 MCG (2000 UT) tablet Take 2,000 Units by mouth daily.    [provider]  clonazePAM (KLONOPIN) 1 MG tablet Take 1 tablet (1 mg total) by mouth at bedtime. Do not combine with Xanax 05/22/20   [name], PMHNP  CONTOUR NEXT TEST test strip USE TO TEST BLOOD SUGAR TWICE DAILY 04/08/20   Elayne Snare, MD  divalproex (DEPAKOTE) 250 MG DR tablet Take 2 tablets (500 mg total) by mouth at bedtime. 04/23/20   [name], PMHNP  docusate sodium (COLACE) 100 MG capsule Take 1 capsule (100 mg total) by mouth daily. Patient not taking: Reported on 04/16/2020 12/10/19 12/09/20  Dwyane Dee, MD  donepezil (ARICEPT) 10 MG tablet Take 1 tablet (10 mg total) by mouth at bedtime. 04/23/20   [name], PMHNP  gabapentin (NEURONTIN) 600 MG tablet Take 600 mg by mouth 3 (three) times daily. 05/12/19   [provider]  hyoscyamine (LEVSIN) 0.125 MG tablet Take 0.125 mg by mouth every 4 (four) hours as needed for bladder spasms.  Patient not taking: No sig reported 11/28/19   [provider]  Iloperidone (FANAPT) 1 MG TABS Take 1 tablet (1 mg total) by mouth at bedtime. 05/22/20   [name], PMHNP  insulin glargine (LANTUS SOLOSTAR) 100 UNIT/ML Solostar Pen ADMINISTER 54 UNITS UNDER THE SKIN EVERY DAY 04/30/20   Elayne Snare, MD  insulin glargine (LANTUS SOLOSTAR) 100 UNIT/ML Solostar Pen ADMINISTER 54 UNITS UNDER THE SKIN EVERY DAY 04/30/20   Elayne Snare, MD  levothyroxine (SYNTHROID) 75 MCG tablet TAKE 1 TABLET BY MOUTH EVERY DAY BEFORE BREAKFAST Patient taking differently: Take 75 mcg by mouth daily before breakfast. 02/22/20   Elayne Snare, MD  lithium carbonate (ESKALITH) 450 MG CR tablet Take 1 tablet (450 mg total) by mouth at bedtime. 02/21/20   [name], PMHNP  loperamide (IMODIUM A-D) 2 MG tablet Take 2 mg by mouth 2 (two) times daily as needed for diarrhea or loose stools.    [provider]  losartan (COZAAR) 25 MG tablet Take 12.5 mg by mouth daily. 08/31/19   [provider]  Melatonin 10 MG TBCR Take 10 mg by mouth at bedtime.    [provider]  memantine (NAMENDA) 10 MG tablet TAKE 1 TABLET(10 MG) BY MOUTH EVERY EVENING 04/23/20   [name], PMHNP  methocarbamol (ROBAXIN) 500 MG tablet Take 1 tablet (500 mg total) by mouth every 6 (six) hours as needed for muscle spasms. Patient not taking: No sig reported 02/08/19   Danae Orleans, PA-C  Microlet Lancets MISC USE DAILY AS DIRECTED 10/24/18   Elayne Snare, MD  mirtazapine (REMERON) 15 MG tablet Take 2 tablets (30 mg total) by mouth at bedtime. 05/02/20   [name], PMHNP  nitroGLYCERIN (NITROSTAT) 0.4 MG SL tablet Place 1 tablet (0.4 mg total) under the tongue every 5 (five) minutes as needed for chest pain. 09/14/17   Lyda Jester M, PA-C  ondansetron (ZOFRAN) 4 MG tablet Take 4 mg by mouth every 8 (eight) hours as needed for nausea or vomiting.  11/26/19   [provider]  oxyCODONE (OXY IR/ROXICODONE) 5 MG immediate release tablet Take 1 tablet (5  mg total) by mouth every 4 (four) hours as needed for severe pain. Patient not taking: Reported on 04/04/2020 12/10/19   Dwyane Dee, MD  pantoprazole (PROTONIX) 40 MG tablet Take 1 tablet (40 mg total) by mouth daily. 12/10/19   Dwyane Dee, MD  QUEtiapine (SEROQUEL) 200 MG tablet TAKE 1 TAB PO q 5 pm and 1 tab po QHS 04/23/20   [name], PMHNP  rOPINIRole (REQUIP) 0.5 MG tablet Takes 4 tabs at 5 pm and 3 tabs at bedtime and 1-2 tabs prn Patient taking differently: Take 0.5-2 mg by mouth See admin instructions. Takes 4 tabs at 5 pm and 3 tabs at bedtime and 1-2 tabs as needed for RLS 01/04/20   [name], PMHNP  Semaglutide, 1 MG/DOSE, (OZEMPIC, 1 MG/DOSE,) 4 MG/3ML SOPN Inject 1 mg into the skin once a week. Take on Saturday 02/14/20   Elayne Snare, MD  Vitamin D, Ergocalciferol, (DRISDOL) 1.25 MG (50000 UNIT) CAPS capsule Take 50,000 Units by mouth once a week. 04/19/20   [provider]    Allergies    Codeine, Macrolides and ketolides, Procaine hcl, Aspirin, Benadryl [diphenhydramine], Diflucan [fluconazole], Dilaudid [hydromorphone hcl], Diphenhydramine hcl, Hydrocodone, Other, Procaine, Sulfamethoxazole, Sulfamethoxazole-trimethoprim, Tramadol hcl, and Sulfa antibiotics  Review of Systems   Review of Systems  All other systems reviewed and are negative.   Physical Exam Updated Vital Signs BP (!) 157/77   Pulse (!) 101   Temp 97.7 F (36.5 C)   Resp 18   SpO2 95%   Physical Exam Vitals and nursing note reviewed.  Constitutional:      General: She is not in acute distress.    Appearance: She is well-developed.  HENT:     Head: Normocephalic and atraumatic.  Eyes:     Conjunctiva/sclera: Conjunctivae normal.  Cardiovascular:     Rate and Rhythm: Normal rate and regular rhythm.     Heart sounds: No murmur heard.   Pulmonary:     Effort: Pulmonary effort is normal. No respiratory distress.     Breath sounds: Normal breath sounds.  Chest:     Chest  wall: Tenderness present.  Abdominal:     Palpations: Abdomen is soft.     Tenderness: There is no abdominal tenderness.  Musculoskeletal:        General: Normal range of motion.     Cervical back: Neck supple.  Skin:    General: Skin is warm and dry.  Neurological:     Mental Status: She is alert and oriented to person, place, and time.  Psychiatric:        Mood and Affect: Mood normal.        Behavior: Behavior normal.     ED Results / Procedures / Treatments   Labs (all labs ordered are listed, but only abnormal results are displayed) Labs Reviewed  CBC WITH DIFFERENTIAL/PLATELET  BASIC METABOLIC PANEL  TROPONIN I (HIGH SENSITIVITY)    EKG EKG Interpretation  Date/Time:  Wednesday May 29 2020 04:30:24 EDT Ventricular Rate:  86 PR Interval:    QRS Duration: 87 QT Interval:  348 QTC Calculation: 417 R Axis:   52 Text Interpretation: Sinus rhythm Low voltage, precordial leads Borderline T wave abnormalities No significant change since last tracing Confirmed by Deno Etienne [phone]) on 05/29/2020 4:33:31 AM   Radiology No results found.  Procedures Procedures   Medications Ordered in ED Medications - No  data to display  ED Course  I have reviewed the triage vital signs and the nursing notes.  Pertinent labs & imaging results that were available during my care of the patient were reviewed by me and considered in my medical decision making (see chart for details).    MDM Rules/Calculators/A&P                          This patient complains of chest pain x2 days, this involves an extensive number of treatment options, and is a complaint that carries with it a high risk of complications and morbidity.    Differential Dx ACS, pneumonia, PE, Covid, CHF  Pertinent Labs I ordered, reviewed, and interpreted labs, which included CBC without significant leukocytosis, no significant electrolyte derangement, troponin is 3 making ACS less likely given that her symptoms  of been ongoing for the past 2 days and she has no ischemic EKG changes.  Imaging Interpretation I ordered imaging studies which included chest x-ray.  I independently visualized and interpreted the chest x-ray, which showed atelectasis versus a possible developing infiltrate, but with no leukocytosis, no fever, infection is thought less likely.  Medications I ordered medication Pepcid.  Reassessments After the interventions stated above, I reevaluated the patient and found patient reporting improvement after Pepcid.  Plan Discharge with outpatient GI follow-up.  Patient seen by and discussed with Dr. Tyrone Nine, who agrees with plan.    Final Clinical Impression(s) / ED Diagnoses Final diagnoses:  Chest wall pain  Gastroesophageal reflux disease, unspecified whether esophagitis present    Rx / DC Orders ED Discharge Orders         Ordered    famotidine (PEPCID) 20 MG tablet  2 times daily        05/29/20 0611           Montine Circle, PA-C 05/29/20 [place], Ben[place], DO 05/29/20 [phone]

## 2020-05-29 NOTE — Telephone Encounter (Signed)
Can pt be scheduled with Dr Kelton Pillar.

## 2020-05-29 NOTE — ED Triage Notes (Addendum)
Pt c/o sternal chest pains that radiates to jaw for 2 days while at rest. Sts thyroid biopsy last week. Also c/o difficulty swallowing and food getting stuck.

## 2020-05-29 NOTE — Discharge Instructions (Addendum)
Your laboratory work-up is reassuring.  Your symptoms are thought to be related to GERD or reflux, please take the Pepcid as directed.  Please follow-up with the GI doctor listed.  You will need to call and make an appointment.

## 2020-05-30 ENCOUNTER — Ambulatory Visit
Admission: RE | Admit: 2020-05-30 | Discharge: 2020-05-30 | Disposition: A | Discharge status: Home / Self Care | Payer: Medicare Other | Source: Ambulatory Visit | Attending: Gastroenterology | Admitting: Gastroenterology

## 2020-05-30 ENCOUNTER — Other Ambulatory Visit: Payer: Medicare Other

## 2020-05-30 DIAGNOSIS — R1084 Generalized abdominal pain: Secondary | ICD-10-CM

## 2020-05-30 DIAGNOSIS — Z9049 Acquired absence of other specified parts of digestive tract: Secondary | ICD-10-CM | POA: Diagnosis not present

## 2020-05-30 DIAGNOSIS — R1310 Dysphagia, unspecified: Secondary | ICD-10-CM

## 2020-05-30 DIAGNOSIS — N281 Cyst of kidney, acquired: Secondary | ICD-10-CM | POA: Diagnosis not present

## 2020-05-30 DIAGNOSIS — K5730 Diverticulosis of large intestine without perforation or abscess without bleeding: Secondary | ICD-10-CM | POA: Diagnosis not present

## 2020-05-30 DIAGNOSIS — R109 Unspecified abdominal pain: Secondary | ICD-10-CM

## 2020-05-30 DIAGNOSIS — N261 Atrophy of kidney (terminal): Secondary | ICD-10-CM | POA: Diagnosis not present

## 2020-05-30 DIAGNOSIS — K224 Dyskinesia of esophagus: Secondary | ICD-10-CM | POA: Diagnosis not present

## 2020-05-30 MED ORDER — IOPAMIDOL (ISOVUE-300) INJECTION 61%
100.0000 mL | Freq: Once | INTRAVENOUS | Status: AC | PRN
  Administered 2020-05-30: 100 mL via INTRAVENOUS

## 2020-05-31 ENCOUNTER — Other Ambulatory Visit: Payer: Self-pay | Admitting: Gastroenterology

## 2020-06-02 ENCOUNTER — Other Ambulatory Visit: Payer: Self-pay | Admitting: Endocrinology

## 2020-06-02 DIAGNOSIS — E1165 Type 2 diabetes mellitus with hyperglycemia: Secondary | ICD-10-CM

## 2020-06-06 ENCOUNTER — Ambulatory Visit: Payer: Medicare Other | Admitting: Internal Medicine

## 2020-06-06 DIAGNOSIS — K219 Gastro-esophageal reflux disease without esophagitis: Secondary | ICD-10-CM | POA: Diagnosis not present

## 2020-06-06 DIAGNOSIS — R1310 Dysphagia, unspecified: Secondary | ICD-10-CM | POA: Diagnosis not present

## 2020-06-07 ENCOUNTER — Other Ambulatory Visit: Payer: Self-pay | Admitting: Endocrinology

## 2020-06-07 DIAGNOSIS — E1165 Type 2 diabetes mellitus with hyperglycemia: Secondary | ICD-10-CM

## 2020-06-11 ENCOUNTER — Other Ambulatory Visit: Payer: Self-pay | Admitting: Endocrinology

## 2020-06-11 ENCOUNTER — Other Ambulatory Visit: Payer: Self-pay

## 2020-06-11 ENCOUNTER — Other Ambulatory Visit (HOSPITAL_COMMUNITY)
Admission: RE | Admit: 2020-06-11 | Discharge: 2020-06-11 | Disposition: A | Discharge status: Home / Self Care | Payer: Medicare Other | Source: Ambulatory Visit | Attending: Gastroenterology | Admitting: Gastroenterology

## 2020-06-11 DIAGNOSIS — E669 Obesity, unspecified: Secondary | ICD-10-CM

## 2020-06-11 DIAGNOSIS — F319 Bipolar disorder, unspecified: Secondary | ICD-10-CM

## 2020-06-11 DIAGNOSIS — Z01812 Encounter for preprocedural laboratory examination: Secondary | ICD-10-CM | POA: Diagnosis not present

## 2020-06-11 DIAGNOSIS — F5101 Primary insomnia: Secondary | ICD-10-CM

## 2020-06-11 DIAGNOSIS — E1169 Type 2 diabetes mellitus with other specified complication: Secondary | ICD-10-CM

## 2020-06-11 DIAGNOSIS — Z20822 Contact with and (suspected) exposure to covid-19: Secondary | ICD-10-CM | POA: Insufficient documentation

## 2020-06-11 DIAGNOSIS — F411 Generalized anxiety disorder: Secondary | ICD-10-CM

## 2020-06-11 LAB — SARS CORONAVIRUS 2 (TAT 6-24 HRS): SARS Coronavirus 2: NEGATIVE

## 2020-06-13 ENCOUNTER — Ambulatory Visit: Payer: Medicare Other

## 2020-06-13 NOTE — H&P (Signed)
HPI:  General:  75 year old female presents for follow up of dysphagia and GERD. Patient was seen 05/29/20 for difficulty swallowing with both food and liquids getting stuck in her chest upon swallowing. She also reported having burning epigastric pain that was worse with food. She was sent for a barium swallow and it showed distal esophageal narrowing. She was prescribed pantoprozole 40mg  daily and given education on lifestyle modifications. Today she looks and feels much better. She states her dysphagia symptoms have improved as well as her heartburn symptoms. She does endorse breakthrough heartburn that happens daily on the 40mg  PPI, but it is still improved from last visit. She states food still gets stuck occasionally, but not as much since being put on PPI. Patient denies blood in stool, black stool, constipation, diarrhea, N/V.      ROS:  GI PROCEDURE:  Pacemaker/ AICD no. Artificial heart valves no. MI/heart attack no. Abnormal heart rhythm no. Angina no. CVA no. Hypertension YES. Hypotension no. Asthma, COPD no. Sleep apnea no. Seizure disorders no. Artificial joints YES RIGHT HIP AND LEFT KNEE . Severe DJD no. Diabetes YES, type II. Significant headaches no. Vertigo no. Depression/anxiety YES. Abnormal bleeding no. Kidney Disease no. Liver disease no. Chance of pregnancy no. Blood transfusion no.         Medical History: Diabetes mellitus, Hyperlipidemia, Bipolar disorder, RLS, Obesity.        Surgical History: tonsillectomy 1950s, 4- D& C's 1970's, tubal ligation 1970s, partial hysterectomy 1970's, tumor removed from right side of neck 1960's, total right hip replacement 06/09/13, right shoulder 08/2016, heart cath placement 09/2017, knee surgery, left 09/2018, Lt knee replacement 02/2019, colonoscopy 12/2016, 11/21, BIOPSY ON THYROID /05/2020 05/2020.        Hospitalization/Major Diagnostic Procedure: colon issues Diverticulitis 11/20/16, Lt knee replacement 02/2019, Lft Knee pain 08/2019,  colitis 12/2019, Not since colitis hospitalization 01/2020, trouble sleeping 04/2020, er FOR CHEST PAIN AND SOB 05/29/20, not in the pastyr 05/2020.        Family History: Father: deceased 88 yrs, melanoma, diagnosed with Hypertension, Prostate CA. Mother: deceased 91 yrs, alcoholic. Brother 1: alive 33 yrs, kidney cancer-resolved, diagnosed with Hypertension. Brother2: alive 31 yrs, macular degeneration, 3 knee replacements, diagnosed with Hypertension. Brother 3: alive 3 yrs, CABG, diagnosed with Hypertension. 3 brother(s) . 2 daughter(s) . Marland Kitchen  No Family History of Colon Cancer, Polyps, or Liver Disease.       Social History:  General: Tobacco use cigarettes: Never smoked, Tobacco history last updated 06/06/2020, Vaping No. no EXPOSURE TO PASSIVE SMOKE. no Alcohol. Caffeine: soda-diet, occasionally, , tea, occasionally. no Recreational drug use, never. DIET: no particular dietary program. Exercise: yes, Currently attending PT 2x daily via Knee replacement, stat bike, daily. Marital Status: married. Children: girls, 2. OCCUPATION: Retired Development worker, community.        Medications: Taking Cephalexin 500 MG Capsule 1 capsule Orally twice a day, Taking Ergocalciferol 1.25 MG (50000 UT) Capsule 1 capsule Orally Once a week x 8 weeks, Taking Furosemide 20 MG Tablet TAKE 1 TABLET BY MOUTH EVERY DAY AS NEEDED FOR SWELLING , Taking Gabapentin 600 MG Tablet 1 capsule Orally three times a day, Taking Potassium Chloride ER 20 MEQ Tablet Extended Release 1 tablet with food Orally Once a day with Furosemide, Taking Pyridium(Phenazopyridine HCl) 200 MG Tablet 1 tablet Orally three times a day as needed, Taking Pantoprazole Sodium 40 MG Tablet Delayed Release 1 tablet Orally Once a day, Taking metroNIDAZOLE 500 MG Tablet 1 tablet Orally Three times  a day, Taking Cipro(Ciprofloxacin) 500 MG Tablet 1 tablet Orally every 12 hrs, Taking Levsin(Hyoscyamine Sulfate) 0.125 MG Tablet 1 tablet as needed Orally every 4 hrs, Taking  Lipitor(Atorvastatin Calcium) 40 MG Tablet 1 tablet Orally Once a day, Taking Ondansetron 4 MG Tablet Disintegrating 1 tablet on the tongue and allow to dissolve Orally Once a day, Notes: as needed, Taking Promethazine HCl 12.5 MG Tablet 1 tablet as needed Orally twice a day as needed, Taking Vitamin B-12 50 MCG Tablet as directed Orally , Notes: Listed in EPIC, Taking Melatonin 10 MG Tablet 1 tablet in the evening Orally Once a day, Taking Losartan Potassium 25 MG Tablet 1/2 tablet Orally Once a day, Taking CeleBREX(Celecoxib) 200 MG Capsule 1 capsule with food Orally Once a day, Taking Methocarbamol 500 MG Tablet 1 tablet as needed for muscle spasms Orally every 6 hrs, Taking Ozempic (1 MG/DOSE)(Semaglutide (1 MG/DOSE)) 2 MG/1.5ML Solution Pen-injector INJECT 1 MG UNDER THE SKIN ONCE A WEEK Subcutaneous , Taking Acetaminophen 500 MG Tablet 2 tablets Orally Every 8 hours, Taking Synthroid(L-Thyroxine Sodium) 75 MCG Tablet 1 tablet on an empty stomach in the morning Orally Once a day, Taking Vitamin D3 50 MCG (2000 UT) Capsule 1 capsule Orally Once a day, Taking SEROquel(QUEtiapine Fumarate) 200 MG Tablet 1 tablet Orally twice a day, Taking Aricept(Donepezil HCl) 10 MG Tablet 1 tablet at bedtime Orally Once a day, Taking Depakote(Divalproex Sodium) 250 MG Tablet Delayed Release 2 tablets Orally at bedtime, Taking Memantine HCl 10 MG Tablet 1 tablet Orally once a day at 5pm, Taking Lantus SoloStar(Insulin Glargine) 100 UNIT/ML Solution Pen-injector 50 units Subcutaneous once a day at 5pm, Taking Lithium Carbonate 300 MG Tablet 1.5 tablets Orally at bedtime, Taking immodium 1 tablet orally as needed, Taking Xanax(ALPRAZolam) 0.5 MG Tablet 1 tablet Orally four times a day, Taking Nitroglycerin 0.4 MG Tablet Sublingual as directed Sublingual , Medication List reviewed and reconciled with the patient       Allergies: Codeine (for allergy): hallucinations - Side Effects, Macrolides and Ketolides: vomiting and diarrhea  - Allergy, Dilaudid: halluciantions - Side Effects, Hydrocodone Bitartrate: hallucinations - Side Effects, Novocain: heart races - Allergy, Bactrim DS: unknown - per hosp - Allergy, Sulfamethoxazole: unknown - per hosp - Allergy, Tramadol HCl: felt weird - Side Effects.       Objective:     Vitals: Wt 184, Wt change 1.4 lb, Ht 60.5, BMI 35.34, Temp 98.6, Pulse sitting 84, BP sitting 161/90.       Examination:  Gastroenterology Exam: GENERAL APPEARANCE: Well developed, well nourished, no active distress, pleasant, no acute distress . EYES: Lids and conjunctiva normal. Sclera normal, pupils equal and reactive. RESPIRATORY Breath sounds normal. Respiration even and unlabored. CARDIOVASCULAR Normal RRR w/o murmers or gallops. No peripheral edema. ABDOMEN No masses palpated. Liver and spleen not palpated, normal. Bowel sounds normal, Abdomen not distended. EXTREMITIES: No edema, pulses intact. SKIN Warm and dry. PSYCHIATRIC Alert and oriented x3, mood and affect appear normal..        Therapeutic Interventions:    Assessment:     Assessment:   1. Dysphagia, unspecified type - R13.10 (Primary)  2. Gastroesophageal reflux disease, unspecified whether esophagitis present - K21.9       Plan:     1. Dysphagia, unspecified type  Imaging: EGD w/ DILATATION     McCraw,Sheila 06/06/2020 11:31:37 AM > Scheduled for 06/14/20 at [facility] with Dr Therisa Doyne and COVID test for 06/09/20-Pt given prep inst along with map for COVID testing site  Clinical Notes: Barium swallow showed distal esophageal narrowing. EGD with dilatation to further evaluate. Will get biopsies during EGD to evaluate for H. Pylori and EOE due to persistant GERD symptoms as well. Discontinue PPI until after EGD.  The procedure EGD was thoroughly explained to the patient, including the risks, benefits and alternatives. We discussed the complications including Bleeding and perforation, sedation problems and missing something. The  patient verbalized understanding and agree.       2. Gastroesophageal reflux disease, unspecified whether esophagitis present  Start Pantoprazole Sodium Tablet Delayed Release, 40 MG, 1 tablet, Orally, twice a day, 90 days, 180 Tablet, Refills 3 .  Clinical Notes: Persistant GERD symptoms despite once daily 40mg . Discontinue PPI until after procedure. Then restart PPI 40mg  twice daily for two months and then do once daily for 1 month. Follow up in 3 months to reassess. Educated patient on lifestyle modications.  Take your proton pump inhibitor medication 30 minutes before meals Avoid any dietary triggers Avoid spicy and acidic foods Limit your intake of coffee, tea, alcohol, and carbonated drinks Work to maintain a healthy weight Keep the head of the bed elevated with blocks if you are having any nighttime symptoms Stay upright for 2 hours after eating Avoid meals and snacks three to four hours before bedtime Stop smoking.

## 2020-06-13 NOTE — Anesthesia Preprocedure Evaluation (Addendum)
Anesthesia Evaluation  Patient identified by MRN, date of birth, ID band Patient awake    Reviewed: Allergy & Precautions, NPO status , Patient's Chart, lab work & pertinent test results  History of Anesthesia Complications Negative for: history of anesthetic complications  Airway Mallampati: II  TM Distance: >3 FB Neck ROM: Full    Dental  (+) Implants   Pulmonary neg pulmonary ROS,    Pulmonary exam normal        Cardiovascular hypertension, Pt. on medications Normal cardiovascular exam     Neuro/Psych PSYCHIATRIC DISORDERS Anxiety Depression Bipolar Disorder negative neurological ROS     GI/Hepatic GERD  Medicated,(+)     substance abuse (in remission)  alcohol use, Severe hepatic steatosis   Endo/Other  diabetes, Type 2, Insulin DependentHypothyroidism   Renal/GU negative Renal ROS  negative genitourinary   Musculoskeletal  (+) Arthritis , Fibromyalgia -  Abdominal   Peds  Hematology negative hematology ROS (+)   Anesthesia Other Findings  Echo 2018: EF 60-65%, g1dd, mild MR, normal RV function  Cath 09/14/17:  Widely patent coronary arteries.  Normal left main  Widely patent LAD with eccentric plaque in proximal to mid vessel obstructing up to 30%.  The apical LAD is small in diameter.  Cannot totally exclude the possibility of spontaneous dissection but this seems unlikely.  Dominant right coronary artery with no evidence of obstruction.  Normal left ventricular function.  Normal LVEDP.  EF 60%.  Reproductive/Obstetrics                            Anesthesia Physical Anesthesia Plan  ASA: III  Anesthesia Plan: MAC   Post-op Pain Management:    Induction: Intravenous  PONV Risk Score and Plan: 2 and Propofol infusion, TIVA and Treatment may vary due to age or medical condition  Airway Management Planned: Natural Airway, Nasal Cannula and Simple Face  Mask  Additional Equipment: None  Intra-op Plan:   Post-operative Plan:   Informed Consent: I have reviewed the patients History and Physical, chart, labs and discussed the procedure including the risks, benefits and alternatives for the proposed anesthesia with the patient or authorized representative who has indicated his/her understanding and acceptance.       Plan Discussed with:   Anesthesia Plan Comments:        Anesthesia Quick Evaluation

## 2020-06-14 ENCOUNTER — Ambulatory Visit (HOSPITAL_COMMUNITY)
Admission: RE | Admit: 2020-06-14 | Discharge: 2020-06-14 | Disposition: A | Discharge status: Home / Self Care | Payer: Medicare Other | Source: Ambulatory Visit | Attending: Gastroenterology | Admitting: Gastroenterology

## 2020-06-14 ENCOUNTER — Encounter (HOSPITAL_COMMUNITY)
Admission: RE | Disposition: A | Discharge status: Home / Self Care | Payer: Self-pay | Source: Ambulatory Visit | Attending: Gastroenterology

## 2020-06-14 ENCOUNTER — Other Ambulatory Visit: Payer: Self-pay

## 2020-06-14 ENCOUNTER — Ambulatory Visit (HOSPITAL_COMMUNITY): Payer: Medicare Other | Admitting: Anesthesiology

## 2020-06-14 ENCOUNTER — Encounter (HOSPITAL_COMMUNITY): Payer: Self-pay | Admitting: Gastroenterology

## 2020-06-14 ENCOUNTER — Ambulatory Visit: Payer: Medicare Other | Admitting: Psychiatry

## 2020-06-14 DIAGNOSIS — E669 Obesity, unspecified: Secondary | ICD-10-CM | POA: Diagnosis not present

## 2020-06-14 DIAGNOSIS — R933 Abnormal findings on diagnostic imaging of other parts of digestive tract: Secondary | ICD-10-CM | POA: Diagnosis not present

## 2020-06-14 DIAGNOSIS — E119 Type 2 diabetes mellitus without complications: Secondary | ICD-10-CM | POA: Insufficient documentation

## 2020-06-14 DIAGNOSIS — R1310 Dysphagia, unspecified: Secondary | ICD-10-CM | POA: Diagnosis not present

## 2020-06-14 DIAGNOSIS — Z8249 Family history of ischemic heart disease and other diseases of the circulatory system: Secondary | ICD-10-CM | POA: Diagnosis not present

## 2020-06-14 DIAGNOSIS — Z885 Allergy status to narcotic agent status: Secondary | ICD-10-CM | POA: Diagnosis not present

## 2020-06-14 DIAGNOSIS — Z6834 Body mass index (BMI) 34.0-34.9, adult: Secondary | ICD-10-CM | POA: Insufficient documentation

## 2020-06-14 DIAGNOSIS — K219 Gastro-esophageal reflux disease without esophagitis: Secondary | ICD-10-CM | POA: Diagnosis not present

## 2020-06-14 DIAGNOSIS — K2289 Other specified disease of esophagus: Secondary | ICD-10-CM | POA: Diagnosis not present

## 2020-06-14 DIAGNOSIS — Z794 Long term (current) use of insulin: Secondary | ICD-10-CM | POA: Diagnosis not present

## 2020-06-14 DIAGNOSIS — F319 Bipolar disorder, unspecified: Secondary | ICD-10-CM | POA: Insufficient documentation

## 2020-06-14 DIAGNOSIS — G522 Disorders of vagus nerve: Secondary | ICD-10-CM | POA: Diagnosis not present

## 2020-06-14 DIAGNOSIS — Z8051 Family history of malignant neoplasm of kidney: Secondary | ICD-10-CM | POA: Insufficient documentation

## 2020-06-14 DIAGNOSIS — Z881 Allergy status to other antibiotic agents status: Secondary | ICD-10-CM | POA: Diagnosis not present

## 2020-06-14 DIAGNOSIS — Z79899 Other long term (current) drug therapy: Secondary | ICD-10-CM | POA: Diagnosis not present

## 2020-06-14 DIAGNOSIS — K222 Esophageal obstruction: Secondary | ICD-10-CM | POA: Diagnosis not present

## 2020-06-14 DIAGNOSIS — E785 Hyperlipidemia, unspecified: Secondary | ICD-10-CM | POA: Insufficient documentation

## 2020-06-14 DIAGNOSIS — K3189 Other diseases of stomach and duodenum: Secondary | ICD-10-CM | POA: Diagnosis not present

## 2020-06-14 DIAGNOSIS — Z882 Allergy status to sulfonamides status: Secondary | ICD-10-CM | POA: Diagnosis not present

## 2020-06-14 DIAGNOSIS — Z8042 Family history of malignant neoplasm of prostate: Secondary | ICD-10-CM | POA: Insufficient documentation

## 2020-06-14 DIAGNOSIS — G2581 Restless legs syndrome: Secondary | ICD-10-CM | POA: Diagnosis not present

## 2020-06-14 DIAGNOSIS — R1010 Upper abdominal pain, unspecified: Secondary | ICD-10-CM | POA: Diagnosis not present

## 2020-06-14 DIAGNOSIS — E7800 Pure hypercholesterolemia, unspecified: Secondary | ICD-10-CM | POA: Diagnosis not present

## 2020-06-14 DIAGNOSIS — Z888 Allergy status to other drugs, medicaments and biological substances status: Secondary | ICD-10-CM | POA: Diagnosis not present

## 2020-06-14 DIAGNOSIS — K209 Esophagitis, unspecified without bleeding: Secondary | ICD-10-CM | POA: Diagnosis not present

## 2020-06-14 HISTORY — PX: BALLOON DILATION: SHX5330

## 2020-06-14 HISTORY — PX: BIOPSY: SHX5522

## 2020-06-14 HISTORY — PX: ESOPHAGOGASTRODUODENOSCOPY (EGD) WITH PROPOFOL: SHX5813

## 2020-06-14 LAB — GLUCOSE, CAPILLARY: Glucose-Capillary: 149 mg/dL — ABNORMAL HIGH (ref 70–99)

## 2020-06-14 SURGERY — ESOPHAGOGASTRODUODENOSCOPY (EGD) WITH PROPOFOL
Anesthesia: Monitor Anesthesia Care

## 2020-06-14 MED ORDER — LIDOCAINE 2% (20 MG/ML) 5 ML SYRINGE
INTRAMUSCULAR | Status: DC | PRN
  Administered 2020-06-14: 80 mg via INTRAVENOUS

## 2020-06-14 MED ORDER — LACTATED RINGERS IV SOLN: INTRAVENOUS | Status: DC

## 2020-06-14 MED ORDER — PROPOFOL 500 MG/50ML IV EMUL
INTRAVENOUS | Status: AC
  Filled 2020-06-14: qty 100

## 2020-06-14 MED ORDER — PROPOFOL 500 MG/50ML IV EMUL
INTRAVENOUS | Status: AC
  Filled 2020-06-14: qty 50

## 2020-06-14 MED ORDER — PROPOFOL 10 MG/ML IV BOLUS
INTRAVENOUS | Status: DC | PRN
  Administered 2020-06-14: 30 mg via INTRAVENOUS

## 2020-06-14 MED ORDER — ONDANSETRON HCL 4 MG/2ML IJ SOLN
INTRAMUSCULAR | Status: DC | PRN
  Administered 2020-06-14: 4 mg via INTRAVENOUS

## 2020-06-14 MED ORDER — PROPOFOL 500 MG/50ML IV EMUL
INTRAVENOUS | Status: DC | PRN
  Administered 2020-06-14: 125 ug/kg/min via INTRAVENOUS

## 2020-06-14 SURGICAL SUPPLY — 15 items

## 2020-06-14 NOTE — Brief Op Note (Signed)
06/14/2020  8:59 AM  PATIENT:  [name] [name]  74 y.o. female  PRE-OPERATIVE DIAGNOSIS:  Dysphagia  POST-OPERATIVE DIAGNOSIS:  schatzki's, retained food d/t gastric paresis  PROCEDURE:  Procedure(s): ESOPHAGOGASTRODUODENOSCOPY (EGD) WITH PROPOFOL (N/A) BALLOON DILATION (N/A) BIOPSY  SURGEON:  Surgeon(s) and Role:    Ronnette Juniper, MD - Primary  PHYSICIAN ASSISTANT:   ASSISTANTS: Donne Anon, Shayla Proctor,Tech   ANESTHESIA:   MAC  EBL:  Minimal  BLOOD ADMINISTERED:none  DRAINS: none   LOCAL MEDICATIONS USED:  NONE  SPECIMEN:  Biopsy / Limited Resection  DISPOSITION OF SPECIMEN:  PATHOLOGY  COUNTS:  YES  TOURNIQUET:  * No tourniquets in log *  DICTATION: .Dragon Dictation  PLAN OF CARE: Discharge to home after PACU  PATIENT DISPOSITION:  PACU - hemodynamically stable.   Delay start of Pharmacological VTE agent (>24hrs) due to surgical blood loss or risk of bleeding: not applicable

## 2020-06-14 NOTE — Anesthesia Postprocedure Evaluation (Signed)
Anesthesia Post Note  Patient: [name] [name]  Procedure(s) Performed: ESOPHAGOGASTRODUODENOSCOPY (EGD) WITH PROPOFOL (N/A ) BALLOON DILATION (N/A ) BIOPSY     Patient location during evaluation: Endoscopy Anesthesia Type: MAC Level of consciousness: awake and alert Pain management: pain level controlled Vital Signs Assessment: post-procedure vital signs reviewed and stable Respiratory status: spontaneous breathing, nonlabored ventilation and respiratory function stable Cardiovascular status: blood pressure returned to baseline and stable Postop Assessment: no apparent nausea or vomiting Anesthetic complications: no   No complications documented.  Last Vitals:  Vitals:   06/14/20 0857 06/14/20 0910  BP: (!) 144/66 136/64  Pulse: 71 78  Resp: 14 16  Temp: 36.6 C   SpO2: 100% 94%    Last Pain:  Vitals:   06/14/20 0910  TempSrc:   PainSc: 0-No pain                 Lidia Collum

## 2020-06-14 NOTE — Anesthesia Procedure Notes (Signed)
Procedure Name: MAC Date/Time: 06/14/2020 8:33 AM Performed by: Maxwell Caul, CRNA Pre-anesthesia Checklist: Patient identified, Emergency Drugs available, Suction available and Patient being monitored Oxygen Delivery Method: Simple face mask

## 2020-06-14 NOTE — Transfer of Care (Signed)
Immediate Anesthesia Transfer of Care Note  Patient: [name] [name]  Procedure(s) Performed: ESOPHAGOGASTRODUODENOSCOPY (EGD) WITH PROPOFOL (N/A ) BALLOON DILATION (N/A ) BIOPSY  Patient Location: PACU and Endoscopy Unit  Anesthesia Type:MAC  Level of Consciousness: awake, alert  and oriented  Airway & Oxygen Therapy: Patient Spontanous Breathing and Patient connected to face mask oxygen  Post-op Assessment: Report given to RN and Post -op Vital signs reviewed and stable  Post vital signs: Reviewed and stable  Last Vitals:  Vitals Value Taken Time  BP    Temp    Pulse    Resp 14 06/14/20 0857  SpO2    Vitals shown include unvalidated device data.  Last Pain:  Vitals:   06/14/20 0805  TempSrc: Oral  PainSc: 5          Complications: No complications documented.

## 2020-06-14 NOTE — Discharge Instructions (Signed)
YOU HAD AN ENDOSCOPIC PROCEDURE TODAY: Refer to the procedure report and other information in the discharge instructions given to you for any specific questions about what was found during the examination. If this information does not answer your questions, please call [facility] office at [phone] to clarify.   YOU SHOULD EXPECT: Some feelings of bloating in the abdomen. Passage of more gas than usual. Walking can help get rid of the air that was put into your GI tract during the procedure and reduce the bloating. If you had a lower endoscopy (such as a colonoscopy or flexible sigmoidoscopy) you may notice spotting of blood in your stool or on the toilet paper. Some abdominal soreness may be present for a day or two, also.  DIET: Your first meal following the procedure should be a light meal and then it is ok to progress to your normal diet. A half-sandwich or bowl of soup is an example of a good first meal. Heavy or fried foods are harder to digest and may make you feel nauseous or bloated. Drink plenty of fluids but you should avoid alcoholic beverages for 24 hours. If you had a esophageal dilation, please see attached instructions for diet.    ACTIVITY: Your care partner should take you home directly after the procedure. You should plan to take it easy, moving slowly for the rest of the day. You can resume normal activity the day after the procedure however YOU SHOULD NOT DRIVE, use power tools, machinery or perform tasks that involve climbing or major physical exertion for 24 hours (because of the sedation medicines used during the test).   SYMPTOMS TO REPORT IMMEDIATELY: A gastroenterologist can be reached at any hour. Please call [phone]  for any of the following symptoms:  . Following lower endoscopy (colonoscopy, flexible sigmoidoscopy) Excessive amounts of blood in the stool  Significant tenderness, worsening of abdominal pains  Swelling of the abdomen that is new, acute  Fever of 100  or higher  . Following upper endoscopy (EGD, EUS, ERCP, esophageal dilation) Vomiting of blood or coffee ground material  New, significant abdominal pain  New, significant chest pain or pain under the shoulder blades  Painful or persistently difficult swallowing  New shortness of breath  Black, tarry-looking or red, bloody stools  FOLLOW UP:  If any biopsies were taken you will be contacted by phone or by letter within the next 1-3 weeks. Call [phone]  if you have not heard about the biopsies in 3 weeks.  Please also call with any specific questions about appointments or follow up tests. YOU HAD AN ENDOSCOPIC PROCEDURE TODAY: Refer to the procedure report and other information in the discharge instructions given to you for any specific questions about what was found during the examination. If this information does not answer your questions, please call [facility] office at [phone] to clarify.   YOU SHOULD EXPECT: Some feelings of bloating in the abdomen. Passage of more gas than usual. Walking can help get rid of the air that was put into your GI tract during the procedure and reduce the bloating. If you had a lower endoscopy (such as a colonoscopy or flexible sigmoidoscopy) you may notice spotting of blood in your stool or on the toilet paper. Some abdominal soreness may be present for a day or two, also.  DIET: Your first meal following the procedure should be a light meal and then it is ok to progress to your normal diet. A half-sandwich or bowl of soup   is an example of a good first meal. Heavy or fried foods are harder to digest and may make you feel nauseous or bloated. Drink plenty of fluids but you should avoid alcoholic beverages for 24 hours. If you had a esophageal dilation, please see attached instructions for diet.    ACTIVITY: Your care partner should take you home directly after the procedure. You should plan to take it easy, moving slowly for the rest of the day. You can  resume normal activity the day after the procedure however YOU SHOULD NOT DRIVE, use power tools, machinery or perform tasks that involve climbing or major physical exertion for 24 hours (because of the sedation medicines used during the test).   SYMPTOMS TO REPORT IMMEDIATELY: A gastroenterologist can be reached at any hour. Please call [phone]  for any of the following symptoms:  . Following upper endoscopy (EGD, EUS, ERCP, esophageal dilation) Vomiting of blood or coffee ground material  New, significant abdominal pain  New, significant chest pain or pain under the shoulder blades  Painful or persistently difficult swallowing  New shortness of breath  Black, tarry-looking or red, bloody stools  FOLLOW UP:  If any biopsies were taken you will be contacted by phone or by letter within the next 1-3 weeks. Call [phone]  if you have not heard about the biopsies in 3 weeks.  Please also call with any specific questions about appointments or follow up tests.  

## 2020-06-14 NOTE — Op Note (Signed)
[facility] Patient Name: [name] [name] Procedure Date: 06/14/2020 MRN: [id number] Attending MD: Ronnette Juniper , MD Date of Birth: [birth year] CSN: [id number] Age: 75 Admit Type: Outpatient Procedure:                Upper GI endoscopy Indications:              Upper abdominal pain, Dysphagia, Abnormal                            cine-esophagram (barium tab did not pass beyond                            distal esophagus, distal esophageal stricture                            suspected), Bloating Providers:                Ronnette Juniper, MD, Mariana Arn, Carmie End, RN,                            Ladona Ridgel, Technician Referring MD:             Larae Grooms Medicines:                Monitored Anesthesia Care Complications:            No immediate complications. Estimated blood loss:                            Minimal. Estimated Blood Loss:     Estimated blood loss was minimal. Procedure:                Pre-Anesthesia Assessment:                           - Prior to the procedure, a History and Physical                            was performed, and patient medications and                            allergies were reviewed. The patient's tolerance of                            previous anesthesia was also reviewed. The risks                            and benefits of the procedure and the sedation                            options and risks were discussed with the patient.                            All questions were answered, and informed consent                            was obtained. Prior Anticoagulants: The  patient has                            taken no previous anticoagulant or antiplatelet                            agents. ASA Grade Assessment: III - A patient with                            severe systemic disease. After reviewing the risks                            and benefits, the patient was deemed in                            satisfactory  condition to undergo the procedure.                           After obtaining informed consent, the endoscope was                            passed under direct vision. Throughout the                            procedure, the patient's blood pressure, pulse, and                            oxygen saturations were monitored continuously. The                            GIF-H190 ([id number]) Olympus gastroscope was                            introduced through the mouth, and advanced to the                            second part of duodenum. The upper GI endoscopy was                            accomplished without difficulty. The patient                            tolerated the procedure well. Scope In: Scope Out: Findings:      No endoscopic abnormality was evident in the esophagus to explain the       patient's complaint of dysphagia. There was no resistance noted during       passage of the scope from the esophagus into the gastric cavity.      A TTS dilator was passed through the scope. Empiric dilation with an       18-19-20 mm x 8 cm CRE balloon dilator was performed with 18 mm balloon       for 1 minutes, 19 mm balloon for 1 minutes and then with 20 mm for a few       seconds before mucosal bleeding was noted. The dilation  site was       examined following endoscope reinsertion and showed moderate mucosal       disruption and complete resolution of luminal narrowing. Biopsies were       obtained from the proximal and distal esophagus with cold forceps for       histology of suspected eosinophilic esophagitis.      A medium amount of food (residue) was found in the gastric body and in       the gastric antrum.      A few dispersed small erosions with no bleeding and no stigmata of       recent bleeding were found in the gastric body and in the gastric antrum.      The cardia and gastric fundus were otherwise normal on retroflexion.      Diffuse mildly erythematous mucosa without bleeding  was found in the       gastric body, at the incisura and in the gastric antrum. Biopsies were       taken with a cold forceps for Helicobacter pylori testing.      Food (residue) was found in the duodenal bulb.      The duodenal bulb, first portion of the duodenum and second portion of       the duodenum were normal. Biopsies for histology were taken with a cold       forceps for evaluation of celiac disease. Impression:               - No endoscopic esophageal abnormality to explain                            patient's dysphagia. Dilated. Biopsied.                           - A medium amount of food (residue) in the stomach.                           - Erosive gastropathy with no bleeding and no                            stigmata of recent bleeding.                           - Erythematous mucosa in the gastric body, incisura                            and antrum. Biopsied.                           - Retained food in the duodenum.                           - Normal duodenal bulb, first portion of the                            duodenum and second portion of the duodenum.                            Biopsied. Moderate Sedation:  Patient did not receive moderate sedation for this procedure, but       instead received monitored anesthesia care. Recommendation:           - Patient has a contact number available for                            emergencies. The signs and symptoms of potential                            delayed complications were discussed with the                            patient. Return to normal activities tomorrow.                            Written discharge instructions were provided to the                            patient.                           - Low fiber diet and low residue diet for suspected                            gastroparesis, small/frequent meals, good blood                            sugar control.                           - Continue present  medications, including                            pantoprazole once a day.                           - Await pathology results. Procedure Code(s):        --- Professional ---                           [phone], Esophagogastroduodenoscopy, flexible,                            transoral; with transendoscopic balloon dilation of                            esophagus (less than 30 mm diameter)                           43239, 59, Esophagogastroduodenoscopy, flexible,                            transoral; with biopsy, single or multiple Diagnosis Code(s):        --- Professional ---  R13.10, Dysphagia, unspecified                           K31.89, Other diseases of stomach and duodenum                           R10.10, Upper abdominal pain, unspecified                           R93.3, Abnormal findings on diagnostic imaging of                            other parts of digestive tract CPT copyright 2019 [employer]. All rights reserved. The codes documented in this report are preliminary and upon coder review may  be revised to meet current compliance requirements. Ronnette Juniper, MD 06/14/2020 8:58:59 AM This report has been signed electronically. Number of Addenda: 0

## 2020-06-14 NOTE — Interval H&P Note (Signed)
History and Physical Interval Note: 74/female with abnormal barium swallow, dysphagia for EGD with balloon dilation.  06/14/2020 8:03 AM  [name]  has presented today for EGD with balloon dilation, with the diagnosis of Dysphagia.  The various methods of treatment have been discussed with the patient and family. After consideration of risks, benefits and other options for treatment, the patient has consented to  Procedure(s): ESOPHAGOGASTRODUODENOSCOPY (EGD) WITH PROPOFOL (N/A) BALLOON DILATION (N/A) as a surgical intervention.  The patient's history has been reviewed, patient examined, no change in status, stable for surgery.  I have reviewed the patient's chart and labs.  Questions were answered to the patient's satisfaction.     Ronnette Juniper

## 2020-06-17 ENCOUNTER — Other Ambulatory Visit: Payer: Self-pay

## 2020-06-17 ENCOUNTER — Encounter: Payer: Self-pay | Admitting: Psychiatry

## 2020-06-17 ENCOUNTER — Ambulatory Visit (INDEPENDENT_AMBULATORY_CARE_PROVIDER_SITE_OTHER): Payer: Medicare Other | Admitting: Psychiatry

## 2020-06-17 DIAGNOSIS — F319 Bipolar disorder, unspecified: Secondary | ICD-10-CM

## 2020-06-17 DIAGNOSIS — G2581 Restless legs syndrome: Secondary | ICD-10-CM | POA: Diagnosis not present

## 2020-06-17 DIAGNOSIS — F5101 Primary insomnia: Secondary | ICD-10-CM | POA: Diagnosis not present

## 2020-06-17 LAB — SURGICAL PATHOLOGY

## 2020-06-17 MED ORDER — MEMANTINE HCL 10 MG PO TABS: ORAL_TABLET | ORAL | 0 refills | Status: DC

## 2020-06-17 MED ORDER — ROPINIROLE HCL 0.5 MG PO TABS: ORAL_TABLET | ORAL | 0 refills | Status: DC

## 2020-06-17 MED ORDER — DONEPEZIL HCL 10 MG PO TABS: 10.0000 mg | ORAL_TABLET | Freq: Every day | ORAL | 0 refills | Status: DC

## 2020-06-17 MED ORDER — LITHIUM CARBONATE ER 450 MG PO TBCR: 450.0000 mg | EXTENDED_RELEASE_TABLET | Freq: Every day | ORAL | 0 refills | Status: DC

## 2020-06-17 MED ORDER — DIVALPROEX SODIUM 250 MG PO DR TAB: 500.0000 mg | DELAYED_RELEASE_TABLET | Freq: Every day | ORAL | 0 refills | Status: DC

## 2020-06-17 MED ORDER — QUETIAPINE FUMARATE 200 MG PO TABS: ORAL_TABLET | ORAL | 0 refills | Status: DC
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
## 2020-06-17 NOTE — Progress Notes (Signed)
   06/17/20 1214  Facial and Oral Movements  Muscles of Facial Expression 0  Lips and Perioral Area 0  Jaw 0  Tongue 0  Extremity Movements  Upper (arms, wrists, hands, fingers) 0  Lower (legs, knees, ankles, toes) 0  Trunk Movements  Neck, shoulders, hips 0  Overall Severity  Severity of abnormal movements (highest score from questions above) 0  Incapacitation due to abnormal movements 0  Patient's awareness of abnormal movements (rate only patient's report) 0  AIMS Total Score  AIMS Total Score 0

## 2020-06-20 ENCOUNTER — Other Ambulatory Visit: Payer: Self-pay | Admitting: *Deleted

## 2020-06-20 ENCOUNTER — Ambulatory Visit (INDEPENDENT_AMBULATORY_CARE_PROVIDER_SITE_OTHER): Payer: Medicare Other | Admitting: Internal Medicine

## 2020-06-20 ENCOUNTER — Other Ambulatory Visit: Payer: Self-pay

## 2020-06-20 ENCOUNTER — Encounter: Payer: Self-pay | Admitting: Internal Medicine

## 2020-06-20 VITALS — BP 132/84 | HR 84 | Ht 62.0 in | Wt 184.5 lb

## 2020-06-20 DIAGNOSIS — E1165 Type 2 diabetes mellitus with hyperglycemia: Secondary | ICD-10-CM

## 2020-06-20 DIAGNOSIS — E1169 Type 2 diabetes mellitus with other specified complication: Secondary | ICD-10-CM

## 2020-06-20 DIAGNOSIS — Z794 Long term (current) use of insulin: Secondary | ICD-10-CM

## 2020-06-20 DIAGNOSIS — E119 Type 2 diabetes mellitus without complications: Secondary | ICD-10-CM | POA: Diagnosis not present

## 2020-06-20 DIAGNOSIS — R1084 Generalized abdominal pain: Secondary | ICD-10-CM

## 2020-06-20 DIAGNOSIS — E669 Obesity, unspecified: Secondary | ICD-10-CM

## 2020-06-20 LAB — POCT GLYCOSYLATED HEMOGLOBIN (HGB A1C): Hemoglobin A1C: 6.8 % — AB (ref 4.0–5.6)

## 2020-06-20 MED ORDER — MICROLET LANCETS MISC: 3 refills | Status: DC

## 2020-06-20 MED ORDER — CONTOUR NEXT TEST VI STRP: ORAL_STRIP | 3 refills | Status: DC

## 2020-06-20 MED ORDER — EMPAGLIFLOZIN 10 MG PO TABS: 10.0000 mg | ORAL_TABLET | Freq: Every day | ORAL | 6 refills | Status: DC

## 2020-06-20 NOTE — Patient Instructions (Addendum)
-   STOP OZEMPIC  -  Increase Lantus to 60 units daily  - Start Jardiance/Farxiga 1 tablet in the morning      HOW TO TREAT LOW BLOOD SUGARS (Blood sugar LESS THAN 70 MG/DL)  Please follow the RULE OF 15 for the treatment of hypoglycemia treatment (when your (blood sugars are less than 70 mg/dL)    STEP 1: Take 15 grams of carbohydrates when your blood sugar is low, which includes:   3-4 GLUCOSE TABS  OR  3-4 OZ OF JUICE OR REGULAR SODA OR  ONE TUBE OF GLUCOSE GEL     STEP 2: RECHECK blood sugar in 15 MINUTES STEP 3: If your blood sugar is still low at the 15 minute recheck --> then, go back to STEP 1 and treat AGAIN with another 15 grams of carbohydrates.

## 2020-06-20 NOTE — Progress Notes (Signed)
Name: [name] [name]  Age/ Sex: 75 y.o., female   MRN/ DOB: [id number], [birth year]     PCP: London Pepper, MD   Reason for Endocrinology Evaluation: Type 2 Diabetes Mellitus  Initial Endocrine Consultative Visit: 04/06/2016    PATIENT IDENTIFIER: [name] [name] is a 75 y.o. female with a past medical history of T2DM . The patient has followed with Endocrinology clinic since 04/06/2016 for consultative assistance with management of her diabetes.  DIABETIC HISTORY:  [name] [name] was diagnosed with DM in 2014, she has been on insulin, metformin, Glipizide , [place] and Tradjenta over the years.Metformin has caused diarrhea.   Her hemoglobin A1c has ranged from 5.7% in 2020, peaking at 8.3% in 2018.   SUBJECTIVE:     Today (06/20/2020): [name] [name] is here for a follow up on hyperglycemia.  She checks her blood sugars  Occasionally . The patient has not had hypoglycemic episodes since the last clinic visit   Recently had EGD and was diagnosed with gastroparesis  Based on the fact that she had food in the stomach .  She has abdominal pain,fullness and bloating, she  denies diarrhea or nausea .   HOME DIABETES REGIMEN:  Ozempic 1 mg weekly  Lantus 56 units daily      METER DOWNLOAD SUMMARY: Date range evaluated: 3/31-4/14/2022 Average Number Tests/Day = 0.4 Overall Mean FS Glucose = 158  BG Ranges: Low = 130 High = 197   Hypoglycemic Events/30 Days: BG < 50 = 0 Episodes of symptomatic severe hypoglycemia = 0    HISTORY:  Past Medical History:  Past Medical History:  Diagnosis Date  . Alcohol abuse   . Anxiety    takes Valium daily as needed and Ativan daily  . Bilateral hearing loss   . Bipolar 1 disorder ([place])    takes Lithium nightly and Synthroid daily  . Chronic back pain    DDD; "all over" (09/14/2017)  . Colitis, ischemic ([place]) 2012  . Confusion    r/t meds  . Depression    takes Prozac daily and Bupspirone   . Diverticulosis   . Dyslipidemia     takes Crestor daily  . Fibromyalgia   . Headache    "weekly" (09/14/2017)  . Hepatic steatosis 06/18/12   severe  . Hyperlipidemia   . Hypertension   . Hypothyroidism   . IBS (irritable bowel syndrome)   . Ischemic colitis ([place])   . Joint pain   . Joint swelling   . Lupus erythematosus tumidus    tumid-skin  . Migraine    "1-2/yr; maybe" (09/14/2017)  . Numbness    in right foot  . Osteoarthritis    "all over" (09/14/2017)  . Osteoarthritis cervical spine   . Osteoarthritis of hand    bilateral  . Pneumonia    "walking pneumonia several times; long time since the last time" (09/14/2017)  . Restless leg syndrome    takes Requip nightly  . Sciatica   . Type II diabetes mellitus ([place])   . Urinary frequency   . Urinary leakage   . Urinary urgency   . Urinary, incontinence, stress female   . Walking pneumonia    last time more than 62yrs ago   Past Surgical History:  Past Surgical History:  Procedure Laterality Date  . ABDOMINAL HYSTERECTOMY     "they left my ovaries"  . APPENDECTOMY    . BALLOON DILATION N/A 06/14/2020   Procedure: BALLOON DILATION;  Surgeon: Ronnette Juniper, MD;  Location: [facility];  Service: Gastroenterology;  Laterality: N/A;  . BIOPSY  06/14/2020   Procedure: BIOPSY;  Surgeon: Ronnette Juniper, MD;  Location: [facility];  Service: Gastroenterology;;  . CARDIAC CATHETERIZATION  09/14/2017  . COLONOSCOPY    . DENTAL SURGERY Left 10/2016   dental implant  . DILATION AND CURETTAGE OF UTERUS  X 4  . ESOPHAGOGASTRODUODENOSCOPY    . ESOPHAGOGASTRODUODENOSCOPY (EGD) WITH PROPOFOL N/A 06/14/2020   Procedure: ESOPHAGOGASTRODUODENOSCOPY (EGD) WITH PROPOFOL;  Surgeon: Ronnette Juniper, MD;  Location: [facility];  Service: Gastroenterology;  Laterality: N/A;  . FLEXIBLE SIGMOIDOSCOPY N/A 06/21/2012   Procedure: FLEXIBLE SIGMOIDOSCOPY;  Surgeon: Jerene Bears, MD;  Location: [facility];  Service: Gastroenterology;  Laterality: N/A;  . JOINT REPLACEMENT    . LEFT HEART CATH AND  CORONARY ANGIOGRAPHY N/A 09/14/2017   Procedure: LEFT HEART CATH AND CORONARY ANGIOGRAPHY;  Surgeon: Belva Crome, MD;  Location: [facility];  Service: Cardiovascular;  Laterality: N/A;  . SHOULDER ARTHROSCOPY Right    "shaved spurs off rotator cuff"  . TONSILLECTOMY    . TOTAL HIP ARTHROPLASTY Right 06/09/2013   Procedure: TOTAL HIP ARTHROPLASTY;  Surgeon: Kerin Salen, MD;  Location: [facility];  Service: Orthopedics;  Laterality: Right;  . TOTAL KNEE ARTHROPLASTY Left 02/07/2019   Procedure: TOTAL KNEE ARTHROPLASTY;  Surgeon: Paralee Cancel, MD;  Location: [facility];  Service: Orthopedics;  Laterality: Left;  70 mins  . TUBAL LIGATION    . TUMOR EXCISION Right 1968   angle of jaw; benign    Social History:  reports that she has never smoked. She has never used smokeless tobacco. She reports previous alcohol use. She reports previous drug use. Frequency: 7.00 times per week. Drug: Benzodiazepines. Family History:  Family History  Problem Relation Age of Onset  . Drug abuse Mother   . Alcohol abuse Mother   . Alcohol abuse Father   . Hypertension Father   . CAD Brother   . Hypertension Brother   . Alcohol abuse Brother   . Hypertension Brother   . Hypertension Brother   . Psoriasis Daughter   . Arthritis Daughter        psoriatic arthritis   . Alcohol abuse Grandchild      HOME MEDICATIONS: Allergies as of 06/20/2020      Reactions   Codeine Anxiety, Other (See Comments)   Hallucinations, tolerates oxycodone    Macrolides And Ketolides Nausea And Vomiting   Other reaction(s): vomiting and diarrhea   Procaine Hcl Palpitations   Aspirin Nausea And Vomiting, Other (See Comments)   Reaction:  Burns pts stomach    Benadryl [diphenhydramine] Other (See Comments)   Per MD "inhibits potency of gabapentin, lithium etc"   Diflucan [fluconazole] Other (See Comments)   Unknown reaction   Dilaudid [hydromorphone Hcl] Other (See Comments)   Migraines and nightmares    Diphenhydramine  Hcl    Hydrocodone Other (See Comments)   Other Nausea And Vomiting, Other (See Comments)   Pt states that all -mycins cause N/V.    Procaine Other (See Comments)   Sulfamethoxazole Other (See Comments)   Sulfamethoxazole-trimethoprim Other (See Comments)   Tramadol Hcl    Other reaction(s): felt weird   Sulfa Antibiotics Other (See Comments)   Unknown reaction      Medication List       Accurate as of June 20, 2020  9:31 AM. If you have any questions, ask your nurse or doctor.  acetaminophen 500 MG tablet Commonly known as: TYLENOL Take 1,000 mg by mouth 3 (three) times daily as needed for moderate pain.   ALPRAZolam 0.5 MG tablet Commonly known as: XANAX Take 0.5 mg by mouth 4 (four) times daily as needed for anxiety.   atorvastatin 40 MG tablet Commonly known as: LIPITOR Take 40 mg by mouth every evening.   b complex vitamins capsule Take 1 capsule by mouth daily.   BD Pen Needle Nano 2nd Gen 32G X 4 MM Misc Generic drug: Insulin Pen Needle USE TO INJECT 5 TIMES DAILY AS DIRECTED   celecoxib 200 MG capsule Commonly known as: CELEBREX Take 200 mg by mouth at bedtime.   Contour Next Test test strip Generic drug: glucose blood USE TO TEST BLOOD SUGAR TWICE DAILY   divalproex 250 MG DR tablet Commonly known as: DEPAKOTE Take 2 tablets (500 mg total) by mouth at bedtime.   docusate sodium 100 MG capsule Commonly known as: Colace Take 1 capsule (100 mg total) by mouth daily.   donepezil 10 MG tablet Commonly known as: ARICEPT Take 1 tablet (10 mg total) by mouth at bedtime.   famotidine 20 MG tablet Commonly known as: Pepcid Take 1 tablet (20 mg total) by mouth 2 (two) times daily.   gabapentin 600 MG tablet Commonly known as: NEURONTIN Take 600 mg by mouth 3 (three) times daily.   hyoscyamine 0.125 MG tablet Commonly known as: LEVSIN Take 0.125 mg by mouth every 4 (four) hours as needed for bladder spasms.   Lantus SoloStar 100 UNIT/ML  Solostar Pen Generic drug: insulin glargine ADMINISTER 54 UNITS UNDER THE SKIN EVERY DAY What changed:   how much to take  how to take this  when to take this  additional instructions   Lantus SoloStar 100 UNIT/ML Solostar Pen Generic drug: insulin glargine ADMINISTER 54 UNITS UNDER THE SKIN EVERY DAY What changed: Another medication with the same name was changed. Make sure you understand how and when to take each.   levothyroxine 75 MCG tablet Commonly known as: SYNTHROID TAKE 1 TABLET BY MOUTH EVERY DAY BEFORE BREAKFAST What changed: See the new instructions.   lithium carbonate 450 MG CR tablet Commonly known as: ESKALITH Take 1 tablet (450 mg total) by mouth at bedtime.   loperamide 2 MG tablet Commonly known as: IMODIUM A-D Take 2 mg by mouth 2 (two) times daily as needed for diarrhea or loose stools.   losartan 25 MG tablet Commonly known as: COZAAR Take 25 mg by mouth daily.   Melatonin 10 MG Tbcr Take 10 mg by mouth at bedtime.   memantine 10 MG tablet Commonly known as: NAMENDA TAKE 1 TABLET(10 MG) BY MOUTH EVERY EVENING   methocarbamol 500 MG tablet Commonly known as: Robaxin Take 1 tablet (500 mg total) by mouth every 6 (six) hours as needed for muscle spasms.   Microlet Lancets Misc TEST DAILY AS DIRECTED   nitroGLYCERIN 0.4 MG SL tablet Commonly known as: NITROSTAT Place 1 tablet (0.4 mg total) under the tongue every 5 (five) minutes as needed for chest pain.   ondansetron 4 MG tablet Commonly known as: ZOFRAN Take 4 mg by mouth every 8 (eight) hours as needed for nausea or vomiting.   oxyCODONE 5 MG immediate release tablet Commonly known as: Oxy IR/ROXICODONE Take 1 tablet (5 mg total) by mouth every 4 (four) hours as needed for severe pain.   Ozempic (1 MG/DOSE) 4 MG/3ML Sopn Generic drug: Semaglutide (1 MG/DOSE) Inject 1 mg  into the skin once a week. Take on Saturday   pantoprazole 40 MG tablet Commonly known as: PROTONIX Take 1  tablet (40 mg total) by mouth daily.   QUEtiapine 200 MG tablet Commonly known as: SEROQUEL TAKE 1 TAB PO q 5 pm and 1 tab po QHS   rOPINIRole 0.5 MG tablet Commonly known as: REQUIP Takes 4 tabs at 5 pm and 3 tabs at bedtime and 1-2 tabs prn   Vitamin D (Ergocalciferol) 1.25 MG (50000 UNIT) Caps capsule Commonly known as: DRISDOL Take 50,000 Units by mouth once a week.   Vitamin D 50 MCG (2000 UT) tablet Take 2,000 Units by mouth daily.        OBJECTIVE:   Vital Signs: BP 132/84   Pulse 84   Ht 5\' 2"  (1.575 m)   Wt 184 lb 8 oz (83.7 kg)   SpO2 98%   BMI 33.75 kg/m   Wt Readings from Last 3 Encounters:  06/20/20 184 lb 8 oz (83.7 kg)  06/11/20 180 lb (81.6 kg)  12/09/19 179 lb 14.3 oz (81.6 kg)     Exam: General: Pt appears well and is in NAD  Lungs: Clear with good BS bilat with no rales, rhonchi, or wheezes  Heart: RRR  Abdomen: Normoactive bowel sounds, soft, nontender, without masses or organomegaly palpable  Extremities: No pretibial edema.   Neuro: MS is good with appropriate affect, pt is alert and Ox3       DATA REVIEWED:  Lab Results  Component Value Date   HGBA1C 6.8 (A) 06/20/2020   HGBA1C 6.1 12/04/2019   HGBA1C 6.1 08/28/2019        Results for ETHAL, GOTAY (MRN [id number]) as of 06/20/2020 09:33  Ref. Range 05/29/2020 04:40  Sodium Latest Ref Range: 135 - 145 mmol/L 138  Potassium Latest Ref Range: 3.5 - 5.1 mmol/L 3.9  Chloride Latest Ref Range: 98 - 111 mmol/L 105  CO2 Latest Ref Range: 22 - 32 mmol/L 24  Glucose Latest Ref Range: 70 - 99 mg/dL 203 (H)  BUN Latest Ref Range: 8 - 23 mg/dL 14  Creatinine Latest Ref Range: 0.44 - 1.00 mg/dL 0.89  Calcium Latest Ref Range: 8.9 - 10.3 mg/dL 10.1  Anion gap Latest Ref Range: 5 - 15  9  GFR, Estimated Latest Ref Range: >60 mL/min >60    ASSESSMENT / PLAN / RECOMMENDATIONS:   1) Type 2 Diabetes Mellitus, Optimally controlled - Most recent A1c of 6.8 %. Goal A1c < 7.0%.   - She  has been noted with variable fasting hyperglycemia, this is most likely related to increase CHO intake especially at night. She was advised to avoid snacks if possible or stick to a low carb snacks. Will refer her to our RD - Due questionable diagnosis of gastroparesis , I am going to stop Ozempic, I explained to her that the delayed gastric emptying that was noted on EGD is also caused by GLP-1 agonists  - We discussed replacing Ozempic with SGLT-2 inhibitors, cautioned against genital infections, she was encouraged to stay hydrated  - She has a follow up with GI in May  MEDICATIONS:  - STOP OZEMPIC  -  Increase Lantus to 60 units daily  - Start Jardiance/Farxiga 1 tablet in the morning   EDUCATION / INSTRUCTIONS:  BG monitoring instructions: Patient is instructed to check her blood sugars 1 times a day, fasting .  Call [facility] if: BG persistently < 70 . I reviewed the Rule  of 15 for the treatment of hypoglycemia in detail with the patient. Literature supplied.    F/U in 3 months    Signed electronically by: Mack Guise, MD  [facility]  [facility] [address] [place], [place] [address] Phone: [phone] FAX: [phone]   CC: London Pepper, MD [place] [address] Phone: [phone]  Fax: [phone]  Return to Endocrinology clinic as below: Future Appointments  Date Time Provider [place]  07/12/2020 10:30 AM [name], PMHNP [facility] None  08/01/2020 10:20 AM [facility] MM 3 [facility]  09/25/2020 10:30 AM [facility] DX DEXA 1 [facility]

## 2020-07-01 ENCOUNTER — Other Ambulatory Visit (INDEPENDENT_AMBULATORY_CARE_PROVIDER_SITE_OTHER): Payer: Medicare Other

## 2020-07-01 ENCOUNTER — Telehealth: Payer: Self-pay | Admitting: *Deleted

## 2020-07-01 ENCOUNTER — Telehealth: Payer: Self-pay | Admitting: Endocrinology

## 2020-07-01 ENCOUNTER — Other Ambulatory Visit: Payer: Self-pay

## 2020-07-01 ENCOUNTER — Other Ambulatory Visit: Payer: Self-pay | Admitting: Endocrinology

## 2020-07-01 DIAGNOSIS — E119 Type 2 diabetes mellitus without complications: Secondary | ICD-10-CM | POA: Diagnosis not present

## 2020-07-01 DIAGNOSIS — Z794 Long term (current) use of insulin: Secondary | ICD-10-CM

## 2020-07-01 LAB — URINALYSIS, ROUTINE W REFLEX MICROSCOPIC
Bilirubin Urine: NEGATIVE
Hgb urine dipstick: NEGATIVE
Ketones, ur: NEGATIVE
Leukocytes,Ua: NEGATIVE
Nitrite: NEGATIVE
RBC / HPF: NONE SEEN (ref 0–?)
Specific Gravity, Urine: <=1.005 — AB (ref 1.000–1.030)
Total Protein, Urine: NEGATIVE
Urine Glucose: >=1000 — AB
Urobilinogen, UA: 0.2 (ref 0.0–1.0)
pH: 6.5 (ref 5.0–8.0)

## 2020-07-01 NOTE — Telephone Encounter (Signed)
Pls call and schedule

## 2020-07-01 NOTE — Telephone Encounter (Signed)
Need to know what symptoms she is having.  She is having burning and pain with urination she will need a urinalysis otherwise please send in a prescription for Diflucan 150 mg tablet.  She can stop the Jardiance and move up her appointment to see me next month

## 2020-07-01 NOTE — Telephone Encounter (Signed)
We will need to do urinalysis today.  Also front desk please schedule early follow-up

## 2020-07-01 NOTE — Telephone Encounter (Signed)
She was supposed to come in for urinalysis and I do not see any.  In the meantime she can stop Jardiance and please send her 150 mg Diflucan 1 tablet if not already sent

## 2020-07-01 NOTE — Telephone Encounter (Signed)
Pt called after hour--stated on medication diabetes which was changed to Jardiance and have side effect of yeast or UTI which pt thinks has both symptoms.

## 2020-07-01 NOTE — Telephone Encounter (Signed)
Patient requests to be called at ph# [phone] re: patient is having a bad reaction to Jardiance that was prescribed by Dr. Kelton Pillar. Patient states she called [facility] who told her to contact our office. Patient states she has a very bad yeast infection and possible UTI. Patient would like to know if she should discontinue Jardiance.

## 2020-07-01 NOTE — Telephone Encounter (Signed)
Pt states she is having burning, itching, and pain when she urinates.

## 2020-07-01 NOTE — Telephone Encounter (Signed)
Please advise 

## 2020-07-01 NOTE — Telephone Encounter (Signed)
Patient is scheduled for lab today and Dr. Dwyane Dee on 07/04/20

## 2020-07-02 NOTE — Telephone Encounter (Signed)
Urinalysis does not show UTI.  If she cannot take Diflucan then she will have to take an OTC Monistat cream for her yeast infection.  Also please remind her to check her sugar 3 times a day for her upcoming visit

## 2020-07-02 NOTE — Telephone Encounter (Signed)
Called pt stated--allergic reaction to diflucan that make her sick and pt did came by the office yesterday afternoon to have the urinalysis done.

## 2020-07-02 NOTE — Telephone Encounter (Signed)
Notified pt with the urinalysis results--no UTI per Dr. Dwyane Dee and OTC medication. Reminded pt check sugar 3x a day.

## 2020-07-04 ENCOUNTER — Ambulatory Visit (INDEPENDENT_AMBULATORY_CARE_PROVIDER_SITE_OTHER): Payer: Medicare Other | Admitting: Endocrinology

## 2020-07-04 ENCOUNTER — Encounter: Payer: Self-pay | Admitting: Endocrinology

## 2020-07-04 ENCOUNTER — Telehealth: Payer: Self-pay | Admitting: Endocrinology

## 2020-07-04 ENCOUNTER — Other Ambulatory Visit: Payer: Self-pay

## 2020-07-04 VITALS — BP 138/88 | HR 87 | Ht 62.0 in | Wt 184.6 lb

## 2020-07-04 DIAGNOSIS — E1165 Type 2 diabetes mellitus with hyperglycemia: Secondary | ICD-10-CM

## 2020-07-04 DIAGNOSIS — Z794 Long term (current) use of insulin: Secondary | ICD-10-CM

## 2020-07-04 DIAGNOSIS — E782 Mixed hyperlipidemia: Secondary | ICD-10-CM | POA: Diagnosis not present

## 2020-07-04 MED ORDER — METOCLOPRAMIDE HCL 5 MG PO TABS
5.0000 mg | ORAL_TABLET | Freq: Three times a day (TID) | ORAL | 1 refills | Status: DC
Start: 2020-07-04 — End: 2020-08-23

## 2020-07-04 NOTE — Patient Instructions (Addendum)
Check blood sugars on waking 4-5  up days a week  Also check blood sugars about 2 hours after meals and do this after different meals by rotation  Recommended blood sugar levels on waking up are 90-130 and about 2 hours after meal is 130-180  Please bring your blood sugar monitor to each visit, thank you  Ozempic 20 clicks weekly and after 3 weeks call for Rx for the 0.5mg  pen  Reglan 30 min before Bfst and supper  LANTUS 64 UNITS FOR 1 week at least

## 2020-07-04 NOTE — Telephone Encounter (Signed)
Pt called because she just left from Dr.Kumar appt and she called her pharmacy and they still haven't received her new Reglin prescription. I advised pt he probably just hasn't got a chance yet between pts, pt just asks that it be sent over today because she is about to go out of town and she will need it.   [id number] [address], [address] [place] Phone:  [phone]  Fax:  [phone]

## 2020-07-04 NOTE — Progress Notes (Signed)
Patient ID: [name] [name], female   DOB: [birth year], 75 y.o.   MRN: [id number]            Reason for Appointment: Follow-up for Type 2 Diabetes   History of Present Illness:          Date of diagnosis of type 2 diabetes mellitus: ?  2014        Background history:   She thinks her blood sugar was 400 at the time of diagnosis but no detailed records of this are available She did have an A1c of 10.6 done in 2014 and was probably given Lantus for some time initially Apparently she was treated with various medications including metformin, Janumet and Tradgenta. Her blood sugars had improved and A1c in 2015 was down to 6.2 She tends to have diarrhea with metformin and Janumet and also she thinks it causes dry mouth Most likely Janumet was stopped in 06/2015 Glipizide was started in 8/17 when blood sugars were higher and A1c was 9%  Recent history:   INSULIN regimen is:   Lantus 60 units daily at 5 pm  Humalog: Not taking    Non-insulin hypoglycemic drugs the patient is taking are: None, previously on 1 mg Ozempic weekly  Her A1c is last 6.8 previously was at 6.1   Current management, blood sugar patterns and problems identified:  She is not taking 1 mg Ozempic since about 2 weeks ago since she was told to have gastroparesis  Prior to that her blood sugars are averaging 158  Although her blood sugars did improve with starting Jardiance and were below 130 fairly consistently she had to stop Jardiance  She says she had significant yeast infection which is now getting better  More recently blood sugars have been as high as 179 in morning 186 at night  She also has difficulty losing weight and may have gained some since last year  Her Lantus was also increased by 10 units when blood sugars were higher last visit  Diet has been variable  Does not exercise because of knee pain Only recently has started checking her blood sugars more often  Side effects from medications have  been:?  Diarrhea from metformin and Janumet  Glucose monitoring:  done 1 time a day or less        Glucometer: Contour        Blood Glucose readings    PRE-MEAL Fasting Lunch Dinner Bedtime Overall  Glucose range:  108-179     108-186  Mean/median:  140    139   POST-MEAL PC Breakfast PC Lunch PC Dinner  Glucose range:    153, 186  Mean/median:        Self-care: The diet that the patient has been following [id number], tries to limit drinks with sugar .      Typical meal intake: Breakfast at 9,  May be eggs and sausage and lunch is usually a sandwich   Dinner 7 pm               Dietician visit, most recent: 10/18                Weight history:   Wt Readings from Last 3 Encounters:  07/04/20 184 lb 9.6 oz (83.7 kg)  06/20/20 184 lb 8 oz (83.7 kg)  06/11/20 180 lb (81.6 kg)    Glycemic control:   Lab Results  Component Value Date   HGBA1C 6.8 (A) 06/20/2020   HGBA1C 6.1 12/04/2019  HGBA1C 6.1 08/28/2019   Lab Results  Component Value Date   MICROALBUR 8.3 (H) 08/28/2019   CREATININE 0.89 05/29/2020   Lab Results  Component Value Date   MICRALBCREAT 6.5 08/28/2019       Allergies as of 07/04/2020      Reactions   Codeine Anxiety, Other (See Comments)   Hallucinations, tolerates oxycodone    Macrolides And Ketolides Nausea And Vomiting   Other reaction(s): vomiting and diarrhea   Procaine Hcl Palpitations   Aspirin Nausea And Vomiting, Other (See Comments)   Reaction:  Burns pts stomach    Benadryl [diphenhydramine] Other (See Comments)   Per MD "inhibits potency of gabapentin, lithium etc"   Diflucan [fluconazole] Other (See Comments)   Unknown reaction   Dilaudid [hydromorphone Hcl] Other (See Comments)   Migraines and nightmares    Diphenhydramine Hcl    Hydrocodone Other (See Comments)   Other Nausea And Vomiting, Other (See Comments)   Pt states that all -mycins cause N/V.    Procaine Other (See Comments)   Sulfamethoxazole Other (See Comments)    Sulfamethoxazole-trimethoprim Other (See Comments)   Tramadol Hcl    Other reaction(s): felt weird   Sulfa Antibiotics Other (See Comments)   Unknown reaction      Medication List       Accurate as of July 04, 2020  1:16 PM. If you have any questions, ask your nurse or doctor.        acetaminophen 500 MG tablet Commonly known as: TYLENOL Take 1,000 mg by mouth 3 (three) times daily as needed for moderate pain.   ALPRAZolam 0.5 MG tablet Commonly known as: XANAX Take 0.5 mg by mouth 4 (four) times daily as needed for anxiety.   atorvastatin 40 MG tablet Commonly known as: LIPITOR Take 40 mg by mouth every evening.   b complex vitamins capsule Take 1 capsule by mouth daily.   BD Pen Needle Nano 2nd Gen 32G X 4 MM Misc Generic drug: Insulin Pen Needle USE TO INJECT 5 TIMES DAILY AS DIRECTED   celecoxib 200 MG capsule Commonly known as: CELEBREX Take 200 mg by mouth at bedtime.   Contour Next Test test strip Generic drug: glucose blood USE TO TEST BLOOD SUGAR TWICE DAILY   divalproex 250 MG DR tablet Commonly known as: DEPAKOTE Take 2 tablets (500 mg total) by mouth at bedtime.   docusate sodium 100 MG capsule Commonly known as: Colace Take 1 capsule (100 mg total) by mouth daily.   donepezil 10 MG tablet Commonly known as: ARICEPT Take 1 tablet (10 mg total) by mouth at bedtime.   empagliflozin 10 MG Tabs tablet Commonly known as: Jardiance Take 1 tablet (10 mg total) by mouth daily before breakfast.   famotidine 20 MG tablet Commonly known as: Pepcid Take 1 tablet (20 mg total) by mouth 2 (two) times daily.   gabapentin 600 MG tablet Commonly known as: NEURONTIN Take 600 mg by mouth 3 (three) times daily.   hyoscyamine 0.125 MG tablet Commonly known as: LEVSIN Take 0.125 mg by mouth every 4 (four) hours as needed for bladder spasms.   Lantus SoloStar 100 UNIT/ML Solostar Pen Generic drug: insulin glargine ADMINISTER 54 UNITS UNDER THE SKIN  EVERY DAY What changed:   how much to take  how to take this  when to take this  additional instructions   Lantus SoloStar 100 UNIT/ML Solostar Pen Generic drug: insulin glargine ADMINISTER 54 UNITS UNDER THE SKIN EVERY DAY What changed:  Another medication with the same name was changed. Make sure you understand how and when to take each.   levothyroxine 75 MCG tablet Commonly known as: SYNTHROID TAKE 1 TABLET BY MOUTH EVERY DAY BEFORE BREAKFAST What changed: See the new instructions.   lithium carbonate 450 MG CR tablet Commonly known as: ESKALITH Take 1 tablet (450 mg total) by mouth at bedtime.   loperamide 2 MG tablet Commonly known as: IMODIUM A-D Take 2 mg by mouth 2 (two) times daily as needed for diarrhea or loose stools.   losartan 25 MG tablet Commonly known as: COZAAR Take 25 mg by mouth daily.   Melatonin 10 MG Tbcr Take 10 mg by mouth at bedtime.   memantine 10 MG tablet Commonly known as: NAMENDA TAKE 1 TABLET(10 MG) BY MOUTH EVERY EVENING   methocarbamol 500 MG tablet Commonly known as: Robaxin Take 1 tablet (500 mg total) by mouth every 6 (six) hours as needed for muscle spasms.   Microlet Lancets Misc TEST DAILY AS DIRECTED   nitroGLYCERIN 0.4 MG SL tablet Commonly known as: NITROSTAT Place 1 tablet (0.4 mg total) under the tongue every 5 (five) minutes as needed for chest pain.   ondansetron 4 MG tablet Commonly known as: ZOFRAN Take 4 mg by mouth every 8 (eight) hours as needed for nausea or vomiting.   oxyCODONE 5 MG immediate release tablet Commonly known as: Oxy IR/ROXICODONE Take 1 tablet (5 mg total) by mouth every 4 (four) hours as needed for severe pain.   Ozempic (1 MG/DOSE) 4 MG/3ML Sopn Generic drug: Semaglutide (1 MG/DOSE) Inject 1 mg into the skin once a week. Take on Saturday   pantoprazole 40 MG tablet Commonly known as: PROTONIX Take 1 tablet (40 mg total) by mouth daily.   QUEtiapine 200 MG tablet Commonly known  as: SEROQUEL TAKE 1 TAB PO q 5 pm and 1 tab po QHS   rOPINIRole 0.5 MG tablet Commonly known as: REQUIP Takes 4 tabs at 5 pm and 3 tabs at bedtime and 1-2 tabs prn   Vitamin D (Ergocalciferol) 1.25 MG (50000 UNIT) Caps capsule Commonly known as: DRISDOL Take 50,000 Units by mouth once a week.   Vitamin D 50 MCG (2000 UT) tablet Take 2,000 Units by mouth daily.       Allergies:  Allergies  Allergen Reactions  . Codeine Anxiety and Other (See Comments)    Hallucinations, tolerates oxycodone   . Macrolides And Ketolides Nausea And Vomiting    Other reaction(s): vomiting and diarrhea  . Procaine Hcl Palpitations  . Aspirin Nausea And Vomiting and Other (See Comments)    Reaction:  Burns pts stomach   . Benadryl [Diphenhydramine] Other (See Comments)    Per MD "inhibits potency of gabapentin, lithium etc"  . Diflucan [Fluconazole] Other (See Comments)    Unknown reaction   . Dilaudid [Hydromorphone Hcl] Other (See Comments)    Migraines and nightmares   . Diphenhydramine Hcl   . Hydrocodone Other (See Comments)  . Other Nausea And Vomiting and Other (See Comments)    Pt states that all -mycins cause N/V.   Marland Kitchen Procaine Other (See Comments)  . Sulfamethoxazole Other (See Comments)  . Sulfamethoxazole-Trimethoprim Other (See Comments)  . Tramadol Hcl     Other reaction(s): felt weird  . Sulfa Antibiotics Other (See Comments)    Unknown reaction    Past Medical History:  Diagnosis Date  . Alcohol abuse   . Anxiety    takes Valium daily as  needed and Ativan daily  . Bilateral hearing loss   . Bipolar 1 disorder ([place])    takes Lithium nightly and Synthroid daily  . Chronic back pain    DDD; "all over" (09/14/2017)  . Colitis, ischemic ([place]) 2012  . Confusion    r/t meds  . Depression    takes Prozac daily and Bupspirone   . Diverticulosis   . Dyslipidemia    takes Crestor daily  . Fibromyalgia   . Headache    "weekly" (09/14/2017)  . Hepatic steatosis 06/18/12    severe  . Hyperlipidemia   . Hypertension   . Hypothyroidism   . IBS (irritable bowel syndrome)   . Ischemic colitis ([place])   . Joint pain   . Joint swelling   . Lupus erythematosus tumidus    tumid-skin  . Migraine    "1-2/yr; maybe" (09/14/2017)  . Numbness    in right foot  . Osteoarthritis    "all over" (09/14/2017)  . Osteoarthritis cervical spine   . Osteoarthritis of hand    bilateral  . Pneumonia    "walking pneumonia several times; long time since the last time" (09/14/2017)  . Restless leg syndrome    takes Requip nightly  . Sciatica   . Type II diabetes mellitus ([place])   . Urinary frequency   . Urinary leakage   . Urinary urgency   . Urinary, incontinence, stress female   . Walking pneumonia    last time more than 53yrs ago    Past Surgical History:  Procedure Laterality Date  . ABDOMINAL HYSTERECTOMY     "they left my ovaries"  . APPENDECTOMY    . BALLOON DILATION N/A 06/14/2020   Procedure: BALLOON DILATION;  Surgeon: Ronnette Juniper, MD;  Location: [facility];  Service: Gastroenterology;  Laterality: N/A;  . BIOPSY  06/14/2020   Procedure: BIOPSY;  Surgeon: Ronnette Juniper, MD;  Location: [facility];  Service: Gastroenterology;;  . CARDIAC CATHETERIZATION  09/14/2017  . COLONOSCOPY    . DENTAL SURGERY Left 10/2016   dental implant  . DILATION AND CURETTAGE OF UTERUS  X 4  . ESOPHAGOGASTRODUODENOSCOPY    . ESOPHAGOGASTRODUODENOSCOPY (EGD) WITH PROPOFOL N/A 06/14/2020   Procedure: ESOPHAGOGASTRODUODENOSCOPY (EGD) WITH PROPOFOL;  Surgeon: Ronnette Juniper, MD;  Location: [facility];  Service: Gastroenterology;  Laterality: N/A;  . FLEXIBLE SIGMOIDOSCOPY N/A 06/21/2012   Procedure: FLEXIBLE SIGMOIDOSCOPY;  Surgeon: Jerene Bears, MD;  Location: [facility];  Service: Gastroenterology;  Laterality: N/A;  . JOINT REPLACEMENT    . LEFT HEART CATH AND CORONARY ANGIOGRAPHY N/A 09/14/2017   Procedure: LEFT HEART CATH AND CORONARY ANGIOGRAPHY;  Surgeon: Belva Crome, MD;  Location:  [facility];  Service: Cardiovascular;  Laterality: N/A;  . SHOULDER ARTHROSCOPY Right    "shaved spurs off rotator cuff"  . TONSILLECTOMY    . TOTAL HIP ARTHROPLASTY Right 06/09/2013   Procedure: TOTAL HIP ARTHROPLASTY;  Surgeon: Kerin Salen, MD;  Location: [facility];  Service: Orthopedics;  Laterality: Right;  . TOTAL KNEE ARTHROPLASTY Left 02/07/2019   Procedure: TOTAL KNEE ARTHROPLASTY;  Surgeon: Paralee Cancel, MD;  Location: [facility];  Service: Orthopedics;  Laterality: Left;  70 mins  . TUBAL LIGATION    . TUMOR EXCISION Right 1968   angle of jaw; benign    Family History  Problem Relation Age of Onset  . Drug abuse Mother   . Alcohol abuse Mother   . Alcohol abuse Father   . Hypertension Father   .  CAD Brother   . Hypertension Brother   . Alcohol abuse Brother   . Hypertension Brother   . Hypertension Brother   . Psoriasis Daughter   . Arthritis Daughter        psoriatic arthritis   . Alcohol abuse Grandchild     Social History:  reports that she has never smoked. She has never used smokeless tobacco. She reports previous alcohol use. She reports previous drug use. Frequency: 7.00 times per week. Drug: Benzodiazepines.   Review of Systems    Lipid history: On Lipitor 40 mg  LDL has increased She now says that she forgets to take her Lipitor which she thought was supposed to be taken in the morning  Triglycerides last over 300  Taking OTC fish oil    Lab Results  Component Value Date   CHOL 230 (H) 12/04/2019   HDL 56.10 12/04/2019   LDLDIRECT 137.0 12/04/2019   TRIG 322.0 (H) 12/04/2019   CHOLHDL 4 12/04/2019            Most recent eye exam was In 03/2018, negative  Most recent foot exam: 6/21  THYROID: She has been on levothyroxine and Cytomel from her psychiatrist for several years with uncertain diagnosis She is taking levothyroxine 75 g, with Cytomel 25 g Liothyronine is not on her medication list now  Also on lithium  Her TSH is  consistently normal Free T3 and free T4 are normal  Labs as follows:  Lab Results  Component Value Date   TSH 1.37 08/28/2019   TSH 1.02 04/26/2019   TSH 1.35 07/04/2018   FREET4 0.90 08/28/2019   FREET4 1.01 04/26/2019   FREET4 0.77 07/04/2018   Lab Results  Component Value Date   T3FREE 3.2 08/28/2019   T3FREE 2.7 04/26/2019   T3FREE 2.5 07/04/2018   T3FREE 2.3 12/21/2017   T3FREE 3.0 03/22/2017    She has atypical depression on multiple drugs, has been on Seroquel for quite some time  THYROID nodule:  She was found to have a nodule in her thyroid isthmus in 8/20 Ultrasound done in 1/21 showed multinodular goiter with only 1 significant nodule as below  Right mid thyroid nodule (labeled 2, TR 3) meets criteria for surveillance, this is isoechoic  Previously had mild hypercalcemia, last levels:  Lab Results  Component Value Date   PTH 35 10/25/2018   CALCIUM 10.1 05/29/2020   CAION 1.30 07/14/2016   PHOS 2.9 12/26/2010      Physical Examination:  BP 138/88   Pulse 87   Ht 5\' 2"  (1.575 m)   Wt 184 lb 9.6 oz (83.7 kg)   SpO2 98%   BMI 33.76 kg/m      ASSESSMENT:  Diabetes type 2, on insulin  See history of present illness for detailed discussion of current diabetes management, blood sugar patterns, monitor download and problems identified  She is on only Lantus insulin currently  Her A1c is 6.8, previously 6.1  Previously will have good control with Ozempic and Lantus but this was stopped recently because of nonspecific abdominal symptoms and mild nausea Reportedly has gastroparesis on endoscopy but no nuclear study done  Intolerant to Jardiance because of candidiasis  Probable secondary hypothyroidism: Needs labs follow-up  History of hypercholesterolemia: She reportedly has better compliance with Lipitor but needs follow-up labs  PLAN:  Retry Ozempic low doses with 0.25 mg weekly for at least 2 or 3 doses and then try 0.5 mg With her 1 mg  pen she  can do 20 clicks for the 2.02 dose and then call for a new prescription for 0.5 mg pen Consider water aerobics She will call if she has any difficulty  With history of gastroparesis and nausea she will start Reglan at least 5 mg twice daily and will discuss further with gastroenterologist on upcoming visit  There are no Patient Instructions on file for this visit.        Elayne Snare 07/04/2020, 1:16 PM   Note: This office note was prepared with Dragon voice recognition system technology. Any transcriptional errors that result from this process are unintentional.

## 2020-07-04 NOTE — Telephone Encounter (Signed)
Please let her know the prescription has been sent

## 2020-07-10 ENCOUNTER — Emergency Department (HOSPITAL_COMMUNITY): Payer: Medicare Other

## 2020-07-10 ENCOUNTER — Other Ambulatory Visit: Payer: Self-pay

## 2020-07-10 ENCOUNTER — Inpatient Hospital Stay (HOSPITAL_COMMUNITY)
Admission: EM | Admit: 2020-07-10 | Discharge: 2020-07-12 | DRG: 392 | Disposition: A | Discharge status: Home / Self Care | Payer: Medicare Other | Attending: Student | Admitting: Student

## 2020-07-10 ENCOUNTER — Encounter (HOSPITAL_COMMUNITY): Payer: Self-pay

## 2020-07-10 DIAGNOSIS — Z90710 Acquired absence of both cervix and uterus: Secondary | ICD-10-CM

## 2020-07-10 DIAGNOSIS — A084 Viral intestinal infection, unspecified: Secondary | ICD-10-CM | POA: Diagnosis not present

## 2020-07-10 DIAGNOSIS — K862 Cyst of pancreas: Secondary | ICD-10-CM

## 2020-07-10 DIAGNOSIS — F419 Anxiety disorder, unspecified: Secondary | ICD-10-CM | POA: Diagnosis present

## 2020-07-10 DIAGNOSIS — H9193 Unspecified hearing loss, bilateral: Secondary | ICD-10-CM | POA: Diagnosis present

## 2020-07-10 DIAGNOSIS — R1084 Generalized abdominal pain: Secondary | ICD-10-CM | POA: Diagnosis not present

## 2020-07-10 DIAGNOSIS — K8689 Other specified diseases of pancreas: Secondary | ICD-10-CM | POA: Diagnosis present

## 2020-07-10 DIAGNOSIS — M19042 Primary osteoarthritis, left hand: Secondary | ICD-10-CM | POA: Diagnosis present

## 2020-07-10 DIAGNOSIS — Z813 Family history of other psychoactive substance abuse and dependence: Secondary | ICD-10-CM

## 2020-07-10 DIAGNOSIS — Z96643 Presence of artificial hip joint, bilateral: Secondary | ICD-10-CM | POA: Diagnosis present

## 2020-07-10 DIAGNOSIS — Z885 Allergy status to narcotic agent status: Secondary | ICD-10-CM

## 2020-07-10 DIAGNOSIS — M797 Fibromyalgia: Secondary | ICD-10-CM | POA: Diagnosis present

## 2020-07-10 DIAGNOSIS — E1169 Type 2 diabetes mellitus with other specified complication: Secondary | ICD-10-CM | POA: Diagnosis not present

## 2020-07-10 DIAGNOSIS — Q453 Other congenital malformations of pancreas and pancreatic duct: Secondary | ICD-10-CM | POA: Diagnosis not present

## 2020-07-10 DIAGNOSIS — Z886 Allergy status to analgesic agent status: Secondary | ICD-10-CM

## 2020-07-10 DIAGNOSIS — Z888 Allergy status to other drugs, medicaments and biological substances status: Secondary | ICD-10-CM

## 2020-07-10 DIAGNOSIS — E669 Obesity, unspecified: Secondary | ICD-10-CM | POA: Diagnosis not present

## 2020-07-10 DIAGNOSIS — M19041 Primary osteoarthritis, right hand: Secondary | ICD-10-CM | POA: Diagnosis present

## 2020-07-10 DIAGNOSIS — R109 Unspecified abdominal pain: Secondary | ICD-10-CM | POA: Diagnosis not present

## 2020-07-10 DIAGNOSIS — E1143 Type 2 diabetes mellitus with diabetic autonomic (poly)neuropathy: Secondary | ICD-10-CM | POA: Diagnosis present

## 2020-07-10 DIAGNOSIS — R933 Abnormal findings on diagnostic imaging of other parts of digestive tract: Secondary | ICD-10-CM | POA: Diagnosis not present

## 2020-07-10 DIAGNOSIS — E1140 Type 2 diabetes mellitus with diabetic neuropathy, unspecified: Secondary | ICD-10-CM | POA: Diagnosis present

## 2020-07-10 DIAGNOSIS — Z79899 Other long term (current) drug therapy: Secondary | ICD-10-CM

## 2020-07-10 DIAGNOSIS — R935 Abnormal findings on diagnostic imaging of other abdominal regions, including retroperitoneum: Secondary | ICD-10-CM | POA: Diagnosis not present

## 2020-07-10 DIAGNOSIS — I10 Essential (primary) hypertension: Secondary | ICD-10-CM | POA: Diagnosis not present

## 2020-07-10 DIAGNOSIS — Z794 Long term (current) use of insulin: Secondary | ICD-10-CM | POA: Diagnosis not present

## 2020-07-10 DIAGNOSIS — F32A Depression, unspecified: Secondary | ICD-10-CM | POA: Diagnosis present

## 2020-07-10 DIAGNOSIS — E039 Hypothyroidism, unspecified: Secondary | ICD-10-CM | POA: Diagnosis not present

## 2020-07-10 DIAGNOSIS — K869 Disease of pancreas, unspecified: Secondary | ICD-10-CM

## 2020-07-10 DIAGNOSIS — F0390 Unspecified dementia without behavioral disturbance: Secondary | ICD-10-CM | POA: Diagnosis present

## 2020-07-10 DIAGNOSIS — G2581 Restless legs syndrome: Secondary | ICD-10-CM | POA: Diagnosis present

## 2020-07-10 DIAGNOSIS — Z79890 Hormone replacement therapy (postmenopausal): Secondary | ICD-10-CM

## 2020-07-10 DIAGNOSIS — Z20822 Contact with and (suspected) exposure to covid-19: Secondary | ICD-10-CM | POA: Diagnosis present

## 2020-07-10 DIAGNOSIS — K589 Irritable bowel syndrome without diarrhea: Secondary | ICD-10-CM | POA: Diagnosis present

## 2020-07-10 DIAGNOSIS — F341 Dysthymic disorder: Secondary | ICD-10-CM | POA: Diagnosis not present

## 2020-07-10 DIAGNOSIS — Z8249 Family history of ischemic heart disease and other diseases of the circulatory system: Secondary | ICD-10-CM

## 2020-07-10 DIAGNOSIS — L930 Discoid lupus erythematosus: Secondary | ICD-10-CM | POA: Diagnosis present

## 2020-07-10 DIAGNOSIS — F319 Bipolar disorder, unspecified: Secondary | ICD-10-CM | POA: Diagnosis present

## 2020-07-10 DIAGNOSIS — E785 Hyperlipidemia, unspecified: Secondary | ICD-10-CM | POA: Diagnosis present

## 2020-07-10 DIAGNOSIS — R197 Diarrhea, unspecified: Secondary | ICD-10-CM | POA: Diagnosis not present

## 2020-07-10 DIAGNOSIS — K3184 Gastroparesis: Secondary | ICD-10-CM | POA: Diagnosis present

## 2020-07-10 DIAGNOSIS — Z811 Family history of alcohol abuse and dependence: Secondary | ICD-10-CM

## 2020-07-10 HISTORY — DX: Gastroparesis: K31.84

## 2020-07-10 LAB — RESP PANEL BY RT-PCR (FLU A&B, COVID) ARPGX2
Influenza A by PCR: NEGATIVE
Influenza B by PCR: NEGATIVE
SARS Coronavirus 2 by RT PCR: NEGATIVE

## 2020-07-10 LAB — URINALYSIS, ROUTINE W REFLEX MICROSCOPIC
Bacteria, UA: NONE SEEN
Bilirubin Urine: NEGATIVE
Glucose, UA: 50 mg/dL — AB
Hgb urine dipstick: NEGATIVE
Ketones, ur: NEGATIVE mg/dL
Nitrite: NEGATIVE
Protein, ur: NEGATIVE mg/dL
Specific Gravity, Urine: 1.011 (ref 1.005–1.030)
pH: 6 (ref 5.0–8.0)

## 2020-07-10 LAB — COMPREHENSIVE METABOLIC PANEL
ALT: 24 U/L (ref 0–44)
AST: 21 U/L (ref 15–41)
Albumin: 3.9 g/dL (ref 3.5–5.0)
Alkaline Phosphatase: 83 U/L (ref 38–126)
Anion gap: 7 (ref 5–15)
BUN: 9 mg/dL (ref 8–23)
CO2: 25 mmol/L (ref 22–32)
Calcium: 9.8 mg/dL (ref 8.9–10.3)
Chloride: 107 mmol/L (ref 98–111)
Creatinine, Ser: 0.89 mg/dL (ref 0.44–1.00)
GFR, Estimated: >60 mL/min (ref >60)
Glucose, Bld: 169 mg/dL — ABNORMAL HIGH (ref 70–99)
Potassium: 4.2 mmol/L (ref 3.5–5.1)
Sodium: 139 mmol/L (ref 135–145)
Total Bilirubin: 0.5 mg/dL (ref 0.3–1.2)
Total Protein: 7.1 g/dL (ref 6.5–8.1)

## 2020-07-10 LAB — CBC WITH DIFFERENTIAL/PLATELET
Abs Immature Granulocytes: 0.06 10*3/uL (ref 0.00–0.07)
Basophils Absolute: 0 10*3/uL (ref 0.0–0.1)
Basophils Relative: 1 %
Eosinophils Absolute: 0.2 10*3/uL (ref 0.0–0.5)
Eosinophils Relative: 2 %
HCT: 44.2 % (ref 36.0–46.0)
Hemoglobin: 13.8 g/dL (ref 12.0–15.0)
Immature Granulocytes: 1 %
Lymphocytes Relative: 21 %
Lymphs Abs: 1.8 10*3/uL (ref 0.7–4.0)
MCH: 28.7 pg (ref 26.0–34.0)
MCHC: 31.2 g/dL (ref 30.0–36.0)
MCV: 91.9 fL (ref 80.0–100.0)
Monocytes Absolute: 0.4 10*3/uL (ref 0.1–1.0)
Monocytes Relative: 5 %
Neutro Abs: 6.2 10*3/uL (ref 1.7–7.7)
Neutrophils Relative %: 70 %
Platelets: 183 10*3/uL (ref 150–400)
RBC: 4.81 MIL/uL (ref 3.87–5.11)
RDW: 13.4 % (ref 11.5–15.5)
WBC: 8.8 10*3/uL (ref 4.0–10.5)
nRBC: 0 % (ref 0.0–0.2)

## 2020-07-10 LAB — LIPASE, BLOOD: Lipase: 33 U/L (ref 11–51)

## 2020-07-10 LAB — CBG MONITORING, ED: Glucose-Capillary: 143 mg/dL — ABNORMAL HIGH (ref 70–99)

## 2020-07-10 LAB — GLUCOSE, CAPILLARY: Glucose-Capillary: 98 mg/dL (ref 70–99)

## 2020-07-10 MED ORDER — ONDANSETRON HCL 4 MG/2ML IJ SOLN
4.0000 mg | Freq: Once | INTRAMUSCULAR | Status: AC
  Administered 2020-07-10: 4 mg via INTRAVENOUS
  Filled 2020-07-10: qty 2

## 2020-07-10 MED ORDER — MEMANTINE HCL 10 MG PO TABS
10.0000 mg | ORAL_TABLET | Freq: Every day | ORAL | Status: DC
  Administered 2020-07-11 (×2): 10 mg via ORAL
  Filled 2020-07-10 (×2): qty 1

## 2020-07-10 MED ORDER — INSULIN ASPART 100 UNIT/ML IJ SOLN
0.0000 [IU] | Freq: Three times a day (TID) | INTRAMUSCULAR | Status: DC
  Administered 2020-07-12: 2 [IU] via SUBCUTANEOUS

## 2020-07-10 MED ORDER — IOHEXOL 300 MG/ML  SOLN
100.0000 mL | Freq: Once | INTRAMUSCULAR | Status: AC | PRN
  Administered 2020-07-10: 100 mL via INTRAVENOUS

## 2020-07-10 MED ORDER — ACETAMINOPHEN 650 MG RE SUPP: 650.0000 mg | Freq: Four times a day (QID) | RECTAL | Status: DC | PRN

## 2020-07-10 MED ORDER — ACETAMINOPHEN 325 MG PO TABS
650.0000 mg | ORAL_TABLET | Freq: Four times a day (QID) | ORAL | Status: DC | PRN
  Administered 2020-07-11 (×2): 650 mg via ORAL
  Filled 2020-07-10 (×2): qty 2

## 2020-07-10 MED ORDER — MORPHINE SULFATE (PF) 2 MG/ML IV SOLN
2.0000 mg | INTRAVENOUS | Status: DC | PRN
Start: 2020-07-10 — End: 2020-07-12
  Administered 2020-07-11: 2 mg via INTRAVENOUS
  Filled 2020-07-10: qty 1

## 2020-07-10 MED ORDER — INSULIN ASPART 100 UNIT/ML IJ SOLN: 0.0000 [IU] | Freq: Every day | INTRAMUSCULAR | Status: DC

## 2020-07-10 MED ORDER — DONEPEZIL HCL 10 MG PO TABS
10.0000 mg | ORAL_TABLET | Freq: Every day | ORAL | Status: DC
  Administered 2020-07-11 (×2): 10 mg via ORAL
  Filled 2020-07-10 (×2): qty 1

## 2020-07-10 MED ORDER — LEVOTHYROXINE SODIUM 50 MCG PO TABS
75.0000 ug | ORAL_TABLET | Freq: Every day | ORAL | Status: DC
  Administered 2020-07-11 – 2020-07-12 (×2): 75 ug via ORAL
  Filled 2020-07-10 (×2): qty 1

## 2020-07-10 MED ORDER — MORPHINE SULFATE (PF) 4 MG/ML IV SOLN
4.0000 mg | Freq: Once | INTRAVENOUS | Status: AC
Start: 2020-07-10 — End: 2020-07-10
  Administered 2020-07-10: 4 mg via INTRAVENOUS
  Filled 2020-07-10: qty 1

## 2020-07-10 MED ORDER — INSULIN GLARGINE 100 UNIT/ML ~~LOC~~ SOLN
60.0000 [IU] | Freq: Every day | SUBCUTANEOUS | Status: DC
  Administered 2020-07-11: 60 [IU] via SUBCUTANEOUS
  Filled 2020-07-10 (×2): qty 0.6

## 2020-07-10 MED ORDER — LACTATED RINGERS IV SOLN: INTRAVENOUS | Status: DC

## 2020-07-10 MED ORDER — MORPHINE SULFATE (PF) 4 MG/ML IV SOLN
4.0000 mg | Freq: Once | INTRAVENOUS | Status: AC
  Administered 2020-07-10: 4 mg via INTRAVENOUS
  Filled 2020-07-10: qty 1

## 2020-07-10 MED ORDER — LOSARTAN POTASSIUM 50 MG PO TABS
25.0000 mg | ORAL_TABLET | Freq: Every day | ORAL | Status: DC
  Administered 2020-07-11: 25 mg via ORAL
  Filled 2020-07-10: qty 1

## 2020-07-10 MED ORDER — MELATONIN 5 MG PO TABS
10.0000 mg | ORAL_TABLET | Freq: Every day | ORAL | Status: DC
  Administered 2020-07-11 (×2): 10 mg via ORAL
  Filled 2020-07-10 (×2): qty 2

## 2020-07-10 MED ORDER — ALPRAZOLAM 0.5 MG PO TABS
0.5000 mg | ORAL_TABLET | Freq: Four times a day (QID) | ORAL | Status: DC | PRN
  Administered 2020-07-11 – 2020-07-12 (×4): 0.5 mg via ORAL
  Filled 2020-07-10 (×5): qty 1

## 2020-07-10 MED ORDER — ONDANSETRON HCL 4 MG PO TABS: 4.0000 mg | ORAL_TABLET | Freq: Four times a day (QID) | ORAL | Status: DC | PRN

## 2020-07-10 MED ORDER — ENOXAPARIN SODIUM 40 MG/0.4ML IJ SOSY
40.0000 mg | PREFILLED_SYRINGE | INTRAMUSCULAR | Status: DC
  Administered 2020-07-11: 40 mg via SUBCUTANEOUS
  Filled 2020-07-10: qty 0.4

## 2020-07-10 MED ORDER — ONDANSETRON HCL 4 MG/2ML IJ SOLN: 4.0000 mg | Freq: Four times a day (QID) | INTRAMUSCULAR | Status: DC | PRN

## 2020-07-10 MED ORDER — ATORVASTATIN CALCIUM 40 MG PO TABS
40.0000 mg | ORAL_TABLET | Freq: Every evening | ORAL | Status: DC
  Administered 2020-07-11: 40 mg via ORAL
  Filled 2020-07-10: qty 1

## 2020-07-10 NOTE — ED Provider Notes (Signed)
[facility] DEPT Provider Note   CSN: [id number] Arrival date & time: 07/10/20  1438     History Chief Complaint  Patient presents with  . Abdominal Pain    LARAE BARAHONA is a 75 y.o. female with a history of recurrent diverticulitis, colitis, presented ED with abdominal pain.  She reports has had 3 days of symptoms with abdominal bloating and watery diarrhea.  She says it is worse in the past 24 hours.  She denies nausea or vomiting.  She says his symptoms are very reminiscent of her diverticulitis or colitis.  She reports 8 out of 10, diffuse abdominal pain, which is stabbing, intermittent, nothing makes it better or worse.  In the past 24 hours she feels she has had about 8 loose bowel movements, nonbloody.  She denies fevers or chills.  She does not tolerate Flagyl well due to GI upset.  Last diagnosis and treatment of colitis was in October 2021 with diffuse colitis, treated with Augmentin for 2 weeks.  She has an abdominal surgical history of a hysterectomy and appendectomy.  HPI     Past Medical History:  Diagnosis Date  . Alcohol abuse   . Anxiety    takes Valium daily as needed and Ativan daily  . Bilateral hearing loss   . Bipolar 1 disorder ([place])    takes Lithium nightly and Synthroid daily  . Chronic back pain    DDD; "all over" (09/14/2017)  . Colitis, ischemic ([place]) 2012  . Confusion    r/t meds  . Depression    takes Prozac daily and Bupspirone   . Diverticulosis   . Dyslipidemia    takes Crestor daily  . Fibromyalgia   . Gastroparesis   . Headache    "weekly" (09/14/2017)  . Hepatic steatosis 06/18/12   severe  . Hyperlipidemia   . Hypertension   . Hypothyroidism   . IBS (irritable bowel syndrome)   . Ischemic colitis ([place])   . Joint pain   . Joint swelling   . Lupus erythematosus tumidus    tumid-skin  . Migraine    "1-2/yr; maybe" (09/14/2017)  . Numbness    in right foot  . Osteoarthritis    "all over"  (09/14/2017)  . Osteoarthritis cervical spine   . Osteoarthritis of hand    bilateral  . Pneumonia    "walking pneumonia several times; long time since the last time" (09/14/2017)  . Restless leg syndrome    takes Requip nightly  . Sciatica   . Type II diabetes mellitus ([place])   . Urinary frequency   . Urinary leakage   . Urinary urgency   . Urinary, incontinence, stress female   . Walking pneumonia    last time more than 79yrs ago    Patient Active Problem List   Diagnosis Date Noted  . Intractable abdominal pain 07/10/2020  . Pancreatic abnormality 07/10/2020  . Diabetes mellitus type 2 in obese ([place]) 07/10/2020  . Essential hypertension 07/10/2020  . Bipolar 1 disorder, mixed, moderate ([place])   . Suicidal ideations   . Overweight (BMI 25.0-29.9) 02/08/2019  . S/P left TKA 02/07/2019  . Chest pain   . Chest pain with normal coronary angiography   . Diverticulitis 11/19/2016  . Hypothyroidism 07/14/2016  . Diarrhea   . Generalized abdominal pain   . Major depressive disorder, recurrent severe without psychotic features ([place]) 12/31/2015  . Nausea without vomiting   . Colitis 10/28/2015  . Unintentional poisoning by psychotropic  drug 07/05/2015  . Restless leg syndrome 07/05/2015  . Diabetic polyneuropathy associated with type 2 diabetes mellitus ([place]) 09/21/2014  . Arthritis of left hip 06/09/2013  . Arthritis of right hip 06/08/2013  . Hip pain, right 05/02/2013  . IBS (irritable bowel syndrome) 01/02/2013  . Unspecified vitamin D deficiency 07/05/2012  . Pure hyperglyceridemia 07/04/2012  . Diabetes mellitus with diabetic neuropathy, without long-term current use of insulin ([place]) 06/18/2012  . Hypertension 06/18/2012  . Bipolar 1 disorder ([place]) 06/18/2012  . Low back pain 02/25/2011  . Acute ischemic colitis ([place]) 01/06/2011  . Sciatica 12/19/2010  . Fibromyalgia 10/28/2010  . Primary osteoarthritis of right hip 10/24/2010  . Hyperlipidemia with target low density  lipoprotein (LDL) cholesterol less than 100 mg/dL 05/05/2010  . DEPRESSION/ANXIETY 05/05/2010  . ABUSE, ALCOHOL, IN REMISSION 05/05/2010  . OTITIS MEDIA, CHRONIC 05/05/2010  . Hearing loss 05/05/2010  . ALLERGIC RHINITIS, SEASONAL, MILD 05/05/2010  . URINARY INCONTINENCE, STRESS, FEMALE 05/05/2010  . OSTEOARTHRITIS, HANDS, BILATERAL 05/05/2010  . OSTEOARTHRITIS, CERVICAL SPINE 05/05/2010    Past Surgical History:  Procedure Laterality Date  . ABDOMINAL HYSTERECTOMY     "they left my ovaries"  . APPENDECTOMY    . BALLOON DILATION N/A 06/14/2020   Procedure: BALLOON DILATION;  Surgeon: Ronnette Juniper, MD;  Location: [facility];  Service: Gastroenterology;  Laterality: N/A;  . BIOPSY  06/14/2020   Procedure: BIOPSY;  Surgeon: Ronnette Juniper, MD;  Location: [facility];  Service: Gastroenterology;;  . CARDIAC CATHETERIZATION  09/14/2017  . COLONOSCOPY    . DENTAL SURGERY Left 10/2016   dental implant  . DILATION AND CURETTAGE OF UTERUS  X 4  . ESOPHAGOGASTRODUODENOSCOPY    . ESOPHAGOGASTRODUODENOSCOPY (EGD) WITH PROPOFOL N/A 06/14/2020   Procedure: ESOPHAGOGASTRODUODENOSCOPY (EGD) WITH PROPOFOL;  Surgeon: Ronnette Juniper, MD;  Location: [facility];  Service: Gastroenterology;  Laterality: N/A;  . FLEXIBLE SIGMOIDOSCOPY N/A 06/21/2012   Procedure: FLEXIBLE SIGMOIDOSCOPY;  Surgeon: Jerene Bears, MD;  Location: [facility];  Service: Gastroenterology;  Laterality: N/A;  . JOINT REPLACEMENT    . LEFT HEART CATH AND CORONARY ANGIOGRAPHY N/A 09/14/2017   Procedure: LEFT HEART CATH AND CORONARY ANGIOGRAPHY;  Surgeon: Belva Crome, MD;  Location: [facility];  Service: Cardiovascular;  Laterality: N/A;  . SHOULDER ARTHROSCOPY Right    "shaved spurs off rotator cuff"  . TONSILLECTOMY    . TOTAL HIP ARTHROPLASTY Right 06/09/2013   Procedure: TOTAL HIP ARTHROPLASTY;  Surgeon: Kerin Salen, MD;  Location: [facility];  Service: Orthopedics;  Laterality: Right;  . TOTAL KNEE ARTHROPLASTY Left 02/07/2019    Procedure: TOTAL KNEE ARTHROPLASTY;  Surgeon: Paralee Cancel, MD;  Location: [facility];  Service: Orthopedics;  Laterality: Left;  70 mins  . TUBAL LIGATION    . TUMOR EXCISION Right 1968   angle of jaw; benign     OB History   No obstetric history on file.     Family History  Problem Relation Age of Onset  . Drug abuse Mother   . Alcohol abuse Mother   . Alcohol abuse Father   . Hypertension Father   . CAD Brother   . Hypertension Brother   . Alcohol abuse Brother   . Hypertension Brother   . Hypertension Brother   . Psoriasis Daughter   . Arthritis Daughter        psoriatic arthritis   . Alcohol abuse Grandchild     Social History   Tobacco Use  . Smoking status: Never Smoker  .  Smokeless tobacco: Never Used  Vaping Use  . Vaping Use: Never used  Substance Use Topics  . Alcohol use: Not Currently    Comment: 09/14/2017 "nothing since 04/15/2008"  . Drug use: Not Currently    Frequency: 7.0 times per week    Types: Benzodiazepines    Home Medications Prior to Admission medications   Medication Sig Start Date End Date Taking? Authorizing Provider  acetaminophen (TYLENOL) 500 MG tablet Take 1,000 mg by mouth 3 (three) times daily as needed for moderate pain.    Yes [provider]  ALPRAZolam Duanne Moron) 0.5 MG tablet Take 0.5 mg by mouth 4 (four) times daily as needed for anxiety. 01/04/20  Yes [provider]  atorvastatin (LIPITOR) 40 MG tablet Take 40 mg by mouth every evening.   Yes [provider]  b complex vitamins capsule Take 1 capsule by mouth daily.   Yes [provider]  BD PEN NEEDLE NANO 2ND GEN 32G X 4 MM MISC USE TO INJECT 5 TIMES DAILY AS DIRECTED 10/17/19  Yes Elayne Snare, MD  celecoxib (CELEBREX) 200 MG capsule Take 200 mg by mouth at bedtime.   Yes [provider]  divalproex (DEPAKOTE) 250 MG DR tablet Take 2 tablets (500 mg total) by mouth at bedtime. 06/17/20  Yes [name], PMHNP  donepezil (ARICEPT) 10  MG tablet Take 1 tablet (10 mg total) by mouth at bedtime. 06/17/20  Yes [name], PMHNP  gabapentin (NEURONTIN) 600 MG tablet Take 600 mg by mouth 3 (three) times daily. 05/12/19  Yes [provider]  glucose blood (CONTOUR NEXT TEST) test strip USE TO TEST BLOOD SUGAR TWICE DAILY 06/20/20  Yes Elayne Snare, MD  insulin glargine (LANTUS SOLOSTAR) 100 UNIT/ML Solostar Pen ADMINISTER [place] SKIN EVERY DAY Patient taking differently: Inject 64 Units into the skin at bedtime. 04/30/20  Yes Elayne Snare, MD  levothyroxine (SYNTHROID) 75 MCG tablet TAKE 1 TABLET BY MOUTH EVERY DAY BEFORE BREAKFAST Patient taking differently: Take 75 mcg by mouth daily before breakfast. 02/22/20  Yes Elayne Snare, MD  lithium carbonate (ESKALITH) 450 MG CR tablet Take 1 tablet (450 mg total) by mouth at bedtime. 06/17/20  Yes [name], PMHNP  loperamide (IMODIUM A-D) 2 MG tablet Take 2 mg by mouth 2 (two) times daily as needed for diarrhea or loose stools.   Yes [provider]  losartan (COZAAR) 25 MG tablet Take 25 mg by mouth daily. 08/31/19  Yes [provider]  Melatonin 10 MG TBCR Take 10 mg by mouth at bedtime.   Yes [provider]  memantine (NAMENDA) 10 MG tablet TAKE 1 TABLET(10 MG) BY MOUTH EVERY EVENING 06/17/20  Yes [name], PMHNP  metoCLOPramide (REGLAN) 5 MG tablet Take 1 tablet (5 mg total) by mouth 3 (three) times daily before meals. 07/04/20  Yes Elayne Snare, MD  Microlet Lancets MISC TEST DAILY AS DIRECTED 06/20/20  Yes Elayne Snare, MD  nitroGLYCERIN (NITROSTAT) 0.4 MG SL tablet Place 1 tablet (0.4 mg total) under the tongue every 5 (five) minutes as needed for chest pain. 09/14/17  Yes Rosita Fire, Brittainy M, PA-C  ondansetron (ZOFRAN) 4 MG tablet Take 4 mg by mouth every 8 (eight) hours as needed for nausea or vomiting. 11/26/19  Yes [provider]  QUEtiapine (SEROQUEL) 200 MG tablet TAKE 1 TAB PO q 5 pm and 1 tab po QHS 06/17/20  Yes  [name], PMHNP  rOPINIRole (REQUIP) 0.5 MG tablet Takes 4 tabs at  5 pm and 3 tabs at bedtime and 1-2 tabs prn 06/17/20  Yes [name], PMHNP  Semaglutide, 1 MG/DOSE, (OZEMPIC, 1 MG/DOSE,) 4 MG/3ML SOPN Inject 1 mg into the skin once a week. Take on Saturday Patient taking differently: Inject 0.25 mg into the skin once a week. Take on Saturday 02/14/20  Yes Elayne Snare, MD  Vitamin D, Ergocalciferol, (DRISDOL) 1.25 MG (50000 UNIT) CAPS capsule Take 50,000 Units by mouth once a week. 04/19/20  Yes [provider]  docusate sodium (COLACE) 100 MG capsule Take 1 capsule (100 mg total) by mouth daily. Patient not taking: No sig reported 12/10/19 12/09/20  Dwyane Dee, MD  empagliflozin (JARDIANCE) 10 MG TABS tablet Take 1 tablet (10 mg total) by mouth daily before breakfast. Patient not taking: No sig reported 06/20/20   Shamleffer, Melanie Crazier, MD  insulin glargine (LANTUS SOLOSTAR) 100 UNIT/ML Solostar Pen ADMINISTER 54 UNITS UNDER THE SKIN EVERY DAY Patient not taking: No sig reported 04/30/20   Elayne Snare, MD  methocarbamol (ROBAXIN) 500 MG tablet Take 1 tablet (500 mg total) by mouth every 6 (six) hours as needed for muscle spasms. Patient not taking: No sig reported 02/08/19   Danae Orleans, PA-C  oxyCODONE (OXY IR/ROXICODONE) 5 MG immediate release tablet Take 1 tablet (5 mg total) by mouth every 4 (four) hours as needed for severe pain. Patient not taking: No sig reported 12/10/19   Dwyane Dee, MD  pantoprazole (PROTONIX) 40 MG tablet Take 1 tablet (40 mg total) by mouth daily. Patient not taking: No sig reported 12/10/19   Dwyane Dee, MD    Allergies    Codeine, Procaine hcl, Aspirin, Benadryl [diphenhydramine], Diflucan [fluconazole], Dilaudid [hydromorphone hcl], Other, Tramadol hcl, and Sulfa antibiotics  Review of Systems   Review of Systems  Constitutional: Negative for chills and fever.  HENT: Negative for ear pain and sore throat.   Eyes: Negative  for pain and visual disturbance.  Respiratory: Negative for cough and shortness of breath.   Cardiovascular: Negative for chest pain and palpitations.  Gastrointestinal: Positive for abdominal pain and diarrhea. Negative for blood in stool, nausea and vomiting.  Genitourinary: Negative for dysuria and hematuria.  Musculoskeletal: Negative for arthralgias and back pain.  Skin: Negative for color change and rash.  Neurological: Negative for syncope and light-headedness.  All other systems reviewed and are negative.   Physical Exam Updated Vital Signs BP (!) 154/68   Pulse 70   Temp (!) 97.5 F (36.4 C) (Oral)   Resp 18   SpO2 93%   Physical Exam Constitutional:      General: She is not in acute distress.    Appearance: She is obese.  HENT:     Head: Normocephalic and atraumatic.  Eyes:     Conjunctiva/sclera: Conjunctivae normal.     Pupils: Pupils are equal, round, and reactive to light.  Cardiovascular:     Rate and Rhythm: Normal rate and regular rhythm.  Pulmonary:     Effort: Pulmonary effort is normal. No respiratory distress.  Abdominal:     General: There is no distension.     Tenderness: There is generalized abdominal tenderness. There is no guarding or rebound. Negative signs include Murphy's sign.  Skin:    General: Skin is warm and dry.  Neurological:     General: No focal deficit present.     Mental Status: She is alert. Mental status is at baseline.  Psychiatric:        Mood and Affect: Mood  normal.        Behavior: Behavior normal.     ED Results / Procedures / Treatments   Labs (all labs ordered are listed, but only abnormal results are displayed) Labs Reviewed  COMPREHENSIVE METABOLIC PANEL - Abnormal; Notable for the following components:      Result Value   Glucose, Bld 169 (*)    All other components within normal limits  URINALYSIS, ROUTINE W REFLEX MICROSCOPIC - Abnormal; Notable for the following components:   Glucose, UA 50 (*)     Leukocytes,Ua TRACE (*)    All other components within normal limits  COMPREHENSIVE METABOLIC PANEL - Abnormal; Notable for the following components:   Total Protein 6.0 (*)    Albumin 3.4 (*)    All other components within normal limits  CBG MONITORING, ED - Abnormal; Notable for the following components:   Glucose-Capillary 143 (*)    All other components within normal limits  RESP PANEL BY RT-PCR (FLU A&B, COVID) ARPGX2  GASTROINTESTINAL PANEL BY PCR, STOOL (REPLACES STOOL CULTURE)  LIPASE, BLOOD  CBC WITH DIFFERENTIAL/PLATELET  CBC  GLUCOSE, CAPILLARY  GLUCOSE, CAPILLARY    EKG None  Radiology CT ABDOMEN PELVIS W CONTRAST  Result Date: 07/10/2020 CLINICAL DATA:  Diverticulitis, unspecified abdominal pain EXAM: CT ABDOMEN AND PELVIS WITH CONTRAST TECHNIQUE: Multidetector CT imaging of the abdomen and pelvis was performed using the standard protocol following bolus administration of intravenous contrast. CONTRAST:  17mL OMNIPAQUE IOHEXOL 300 MG/ML  SOLN COMPARISON:  05/30/2020 FINDINGS: Lower chest: No acute abnormality. Hepatobiliary: No focal liver abnormality is seen. No gallstones, gallbladder wall thickening, or biliary dilatation. Pancreas: There is a developing hypodensity within the head of the pancreas abutting the portal vein with effacement of the fat plane between the adjacent vessel. This is not optimally characterized on this examination, however, a a primary pancreatic mass is difficult to exclude. The pancreatic duct is mildly dilated, measuring 6 mm in diameter within the head of the pancreas, extending to the ampulla. This appears slightly progressive since prior examination. No peripancreatic inflammatory change. No peripancreatic adenopathy. No significant parenchymal atrophy. Spleen: Unremarkable Adrenals/Urinary Tract: Adrenal glands are unremarkable. Kidneys are normal, without renal calculi, focal lesion, or hydronephrosis. Bladder is unremarkable. Stomach/Bowel:  Moderate sigmoid diverticulosis is present. The stomach, small bowel, and large bowel, however, are otherwise unremarkable. No evidence of obstruction or focal inflammation. The appendix is absent. No free intraperitoneal gas or fluid. Vascular/Lymphatic: Minimal atherosclerotic calcification noted within the abdominal aorta. No aortic aneurysm. No pathologic adenopathy within the abdomen and pelvis. Reproductive: Status post hysterectomy. No adnexal masses. Other: Tiny fat containing umbilical hernia. The rectum is unremarkable. Musculoskeletal: No acute bone abnormality. Degenerative changes are seen within the lumbar spine. Right total hip arthroplasty has been performed. IMPRESSION: No acute intra-abdominal pathology identified. No definite radiographic explanation for the patient's reported symptoms. Vague hypodensity identified within the head of the pancreas. Dedicated contrast enhanced CT or MRI examination is recommended to exclude the presence of a primary pancreatic mass in this location. Progressive, mild dilation of the main pancreatic duct to the level of the ampulla. This can be seen the setting of a obstructing process such as ampullary stenosis, but is nonspecific. No associated atrophy of the pancreas, peripancreatic inflammatory change, or parenchymal calcifications. Moderate sigmoid diverticulosis. No superimposed inflammatory change. Aortic Atherosclerosis (ICD10-I70.0). Electronically Signed   By: Fidela Salisbury MD   On: 07/10/2020 19:01    Procedures Procedures   Medications Ordered in ED Medications  atorvastatin (LIPITOR) tablet 40 mg (has no administration in time range)  losartan (COZAAR) tablet 25 mg (25 mg Oral Given 07/11/20 0849)  ALPRAZolam Duanne Moron) tablet 0.5 mg (0.5 mg Oral Given 07/11/20 0853)  donepezil (ARICEPT) tablet 10 mg (10 mg Oral Given 07/11/20 0131)  memantine (NAMENDA) tablet 10 mg (10 mg Oral Given 07/11/20 0131)  insulin glargine (LANTUS) injection 60 Units (60  Units Subcutaneous Given 07/11/20 0207)  levothyroxine (SYNTHROID) tablet 75 mcg (75 mcg Oral Given 07/11/20 0528)  melatonin tablet 10 mg (10 mg Oral Given 07/11/20 0131)  insulin aspart (novoLOG) injection 0-15 Units (0 Units Subcutaneous Not Given 07/11/20 0835)  insulin aspart (novoLOG) injection 0-5 Units (0 Units Subcutaneous Not Given 07/10/20 2350)  enoxaparin (LOVENOX) injection 40 mg (40 mg Subcutaneous Given 07/11/20 0849)  lactated ringers infusion ( Intravenous New Bag/Given 07/11/20 0131)  acetaminophen (TYLENOL) tablet 650 mg (650 mg Oral Given 07/11/20 0849)    Or  acetaminophen (TYLENOL) suppository 650 mg ( Rectal See Alternative 07/11/20 0849)  ondansetron (ZOFRAN) tablet 4 mg (has no administration in time range)    Or  ondansetron (ZOFRAN) injection 4 mg (has no administration in time range)  morphine 2 MG/ML injection 2 mg (2 mg Intravenous Given 07/11/20 0152)  morphine 4 MG/ML injection 4 mg (4 mg Intravenous Given 07/10/20 1808)  ondansetron (ZOFRAN) injection 4 mg (4 mg Intravenous Given 07/10/20 1808)  iohexol (OMNIPAQUE) 300 MG/ML solution 100 mL (100 mLs Intravenous Contrast Given 07/10/20 1820)  morphine 4 MG/ML injection 4 mg (4 mg Intravenous Given 07/10/20 2118)    ED Course  I have reviewed the triage vital signs and the nursing notes.  Pertinent labs & imaging results that were available during my care of the patient were reviewed by me and considered in my medical decision making (see chart for details).  This patient presents to the Emergency Department with complaint of abdominal pain. This involves an extensive number of treatment options, and is a complaint that carries with it a high risk of complications and morbidity.  The differential diagnosis includes, but is not limited to, gastritis vs biliary disease vs colitis vs diverticulitis vs other  No hx of C Diff or antibiotics in past 3 month to suggest C. Diff  I ordered, reviewed, and interpreted labs, showing CMP and CBC  near baseline levels, no leukocytosis, UA without sign of infection, lipase normal, covid/flu negative I ordered medication morphine, zofran for abdominal pain and/or nausea I ordered imaging studies which included CT abdomen/pelvis I independently visualized and interpreted imaging which showed nonspecific lesion at head of pancreas, no evident colitis, and the monitor tracing which showed NSR Previous records obtained and reviewed showing prior CT imaging  Clinical Course as of 07/11/20 1041  Wed Jul 10, 2020  1921 No evidence of acute biliary disease or pancreatitis per CT imaging and bloodwork.  CT reviewed.  No evident colitis or leukocytosis. [MT]  2048 Pain mildly improved morphine, but she does not feel comfortable enough to go home.  We discussed CT findings with pancreatic ductal dilation.  This point I think is reasonable to admit her for pain control and GI evaluation. [MT]  2123 Dr Delane Ginger contacted - their team will consult tomorrow, no immediate recommendations for further imaging overnight. [MT]  2143 Admitted to hospitalist  [MT]    Clinical Course User Index [MT] Beauden Tremont, Carola Rhine, MD    Final Clinical Impression(s) / ED Diagnoses Final diagnoses:  Generalized abdominal pain  Pancreatic  lesion    Rx / DC Orders ED Discharge Orders    None       Jalayah Gutridge, Carola Rhine, MD 07/11/20 1041

## 2020-07-10 NOTE — ED Triage Notes (Signed)
Pt arrived via walk in, c/o diffuse abd pain, diarrhea. Denies any vomiting. Hx of gastroparesis.

## 2020-07-10 NOTE — H&P (Signed)
History and Physical    [name] [name] CHE:[id number] DOB: [birth year] DOA: 07/10/2020  PCP: London Pepper, MD   Patient coming from: Home  Chief Complaint:  Abdominal pain  HPI: [name] [name] is a 75 y.o. female with medical history significant for DMT2, HTN, Diverticulitis, colitis presents for evaluation of abdominal pain for past 3 days. She returned from a trip to [place] with her husband 2 nights ago.  The last that she was in [place] she started to have some mild abdominal pain and bloating with intermittent diarrhea that was not bloody.  Since arriving at home pain has worsened.  She continues to have intermittent small bouts of diarrhea but does not have blood or mucus in it.  She denies any nausea or vomiting.  She states she was concerned of having another bout of colitis or diverticulitis so she came for evaluation.  She reports the pain is an 8 out of 10 at its worst and is diffuse over her abdomen.  She has not identified anything that makes it better or worse.  She has not had any fevers or chills. She reports she had a colonoscopy 4 months ago and an upper endoscopy a month ago.  She was diagnosed with gastroparesis a month ago after the endoscopy.  ED Course: The emergency room patient had labs that were unremarkable and has been hemodynamically stable.  CT of her abdomen showed no colitis or acute diverticulitis but she does have a abnormal haziness around the pancreatic head with some elevation of the pancreatic duct.  Radiology recommended MRI with contrast to better evaluate.  ER physician discussed with [facility] gastroenterology who stated they would see patient in the morning in consultation.  Patient sees [facility] as an outpatient.  Review of Systems:  General: Denies weakness, fever, chills, weight loss, night sweats.  Denies dizziness.  Denies change in appetite HENT: Denies head trauma, headache, denies change in hearing, tinnitus.  Denies nasal congestion or bleeding.   Denies sore throat, sores in mouth.  Denies difficulty swallowing Eyes: Denies blurry vision, pain in eye, drainage.  Denies discoloration of eyes. Neck: Denies pain.  Denies swelling.  Denies pain with movement. Cardiovascular: Denies chest pain, palpitations.  Denies edema.  Denies orthopnea Respiratory: Denies shortness of breath, cough.  Denies wheezing.  Denies sputum production Gastrointestinal: Reports abdominal pain and diarrhea. Denies nausea, vomiting.  Denies melena.  Denies hematemesis. Musculoskeletal: Denies limitation of movement.  Denies deformity or swelling.  Denies pain.  Denies arthralgias or myalgias. Genitourinary: Denies pelvic pain.  Denies urinary frequency or hesitancy.  Denies dysuria.  Skin: Denies rash.  Denies petechiae, purpura, ecchymosis. Neurological: Denies headache.  Denies syncope.  Denies seizure activity.  Denies weakness or paresthesia.  Denies slurred speech, drooping face.  Denies visual change. Psychiatric: Denies depression, anxiety. Denies hallucinations.  Past Medical History:  Diagnosis Date  . Alcohol abuse   . Anxiety    takes Valium daily as needed and Ativan daily  . Bilateral hearing loss   . Bipolar 1 disorder ([place])    takes Lithium nightly and Synthroid daily  . Chronic back pain    DDD; "all over" (09/14/2017)  . Colitis, ischemic ([place]) 2012  . Confusion    r/t meds  . Depression    takes Prozac daily and Bupspirone   . Diverticulosis   . Dyslipidemia    takes Crestor daily  . Fibromyalgia   . Gastroparesis   . Headache    "weekly" (09/14/2017)  .  Hepatic steatosis 06/18/12   severe  . Hyperlipidemia   . Hypertension   . Hypothyroidism   . IBS (irritable bowel syndrome)   . Ischemic colitis ([place])   . Joint pain   . Joint swelling   . Lupus erythematosus tumidus    tumid-skin  . Migraine    "1-2/yr; maybe" (09/14/2017)  . Numbness    in right foot  . Osteoarthritis    "all over" (09/14/2017)  . Osteoarthritis cervical  spine   . Osteoarthritis of hand    bilateral  . Pneumonia    "walking pneumonia several times; long time since the last time" (09/14/2017)  . Restless leg syndrome    takes Requip nightly  . Sciatica   . Type II diabetes mellitus ([place])   . Urinary frequency   . Urinary leakage   . Urinary urgency   . Urinary, incontinence, stress female   . Walking pneumonia    last time more than 82yrs ago    Past Surgical History:  Procedure Laterality Date  . ABDOMINAL HYSTERECTOMY     "they left my ovaries"  . APPENDECTOMY    . BALLOON DILATION N/A 06/14/2020   Procedure: BALLOON DILATION;  Surgeon: Ronnette Juniper, MD;  Location: [facility];  Service: Gastroenterology;  Laterality: N/A;  . BIOPSY  06/14/2020   Procedure: BIOPSY;  Surgeon: Ronnette Juniper, MD;  Location: [facility];  Service: Gastroenterology;;  . CARDIAC CATHETERIZATION  09/14/2017  . COLONOSCOPY    . DENTAL SURGERY Left 10/2016   dental implant  . DILATION AND CURETTAGE OF UTERUS  X 4  . ESOPHAGOGASTRODUODENOSCOPY    . ESOPHAGOGASTRODUODENOSCOPY (EGD) WITH PROPOFOL N/A 06/14/2020   Procedure: ESOPHAGOGASTRODUODENOSCOPY (EGD) WITH PROPOFOL;  Surgeon: Ronnette Juniper, MD;  Location: [facility];  Service: Gastroenterology;  Laterality: N/A;  . FLEXIBLE SIGMOIDOSCOPY N/A 06/21/2012   Procedure: FLEXIBLE SIGMOIDOSCOPY;  Surgeon: Jerene Bears, MD;  Location: [facility];  Service: Gastroenterology;  Laterality: N/A;  . JOINT REPLACEMENT    . LEFT HEART CATH AND CORONARY ANGIOGRAPHY N/A 09/14/2017   Procedure: LEFT HEART CATH AND CORONARY ANGIOGRAPHY;  Surgeon: Belva Crome, MD;  Location: [facility];  Service: Cardiovascular;  Laterality: N/A;  . SHOULDER ARTHROSCOPY Right    "shaved spurs off rotator cuff"  . TONSILLECTOMY    . TOTAL HIP ARTHROPLASTY Right 06/09/2013   Procedure: TOTAL HIP ARTHROPLASTY;  Surgeon: Kerin Salen, MD;  Location: [facility];  Service: Orthopedics;  Laterality: Right;  . TOTAL KNEE ARTHROPLASTY Left  02/07/2019   Procedure: TOTAL KNEE ARTHROPLASTY;  Surgeon: Paralee Cancel, MD;  Location: [facility];  Service: Orthopedics;  Laterality: Left;  70 mins  . TUBAL LIGATION    . TUMOR EXCISION Right 1968   angle of jaw; benign    Social History  reports that she has never smoked. She has never used smokeless tobacco. She reports previous alcohol use. She reports previous drug use. Frequency: 7.00 times per week. Drug: Benzodiazepines.  Allergies  Allergen Reactions  . Codeine Anxiety and Other (See Comments)    Hallucinations, tolerates oxycodone   . Procaine Hcl Palpitations  . Aspirin Nausea And Vomiting and Other (See Comments)    Reaction:  Burns pts stomach   . Benadryl [Diphenhydramine] Other (See Comments)    Per MD "inhibits potency of gabapentin, lithium etc"  . Diflucan [Fluconazole] Other (See Comments)    Unknown reaction   . Dilaudid [Hydromorphone Hcl] Other (See Comments)    Migraines and nightmares   .  Hydrocodone Other (See Comments)  . Other Nausea And Vomiting and Other (See Comments)    Pt states that all -mycins cause N/V.   Marland Kitchen Sulfamethoxazole Other (See Comments)  . Tramadol Hcl     Other reaction(s): felt weird  . Sulfa Antibiotics Other (See Comments)    Unknown reaction    Family History  Problem Relation Age of Onset  . Drug abuse Mother   . Alcohol abuse Mother   . Alcohol abuse Father   . Hypertension Father   . CAD Brother   . Hypertension Brother   . Alcohol abuse Brother   . Hypertension Brother   . Hypertension Brother   . Psoriasis Daughter   . Arthritis Daughter        psoriatic arthritis   . Alcohol abuse Grandchild      Prior to Admission medications   Medication Sig Start Date End Date Taking? Authorizing Provider  acetaminophen (TYLENOL) 500 MG tablet Take 1,000 mg by mouth 3 (three) times daily as needed for moderate pain.     [provider]  ALPRAZolam Duanne Moron) 0.5 MG tablet Take 0.5 mg by mouth 4 (four) times daily as  needed for anxiety. 01/04/20   [provider]  atorvastatin (LIPITOR) 40 MG tablet Take 40 mg by mouth every evening.    [provider]  b complex vitamins capsule Take 1 capsule by mouth daily.    [provider]  BD PEN NEEDLE NANO 2ND GEN 32G X 4 MM MISC USE TO INJECT 5 TIMES DAILY AS DIRECTED 10/17/19   Elayne Snare, MD  celecoxib (CELEBREX) 200 MG capsule Take 200 mg by mouth at bedtime.    [provider]  Cholecalciferol (VITAMIN D) 50 MCG (2000 UT) tablet Take 2,000 Units by mouth daily.    [provider]  divalproex (DEPAKOTE) 250 MG DR tablet Take 2 tablets (500 mg total) by mouth at bedtime. 06/17/20   [name], PMHNP  docusate sodium (COLACE) 100 MG capsule Take 1 capsule (100 mg total) by mouth daily. Patient not taking: Reported on 07/04/2020 12/10/19 12/09/20  Dwyane Dee, MD  donepezil (ARICEPT) 10 MG tablet Take 1 tablet (10 mg total) by mouth at bedtime. 06/17/20   [name], PMHNP  empagliflozin (JARDIANCE) 10 MG TABS tablet Take 1 tablet (10 mg total) by mouth daily before breakfast. Patient not taking: Reported on 07/04/2020 06/20/20   Shamleffer, Melanie Crazier, MD  famotidine (PEPCID) 20 MG tablet Take 1 tablet (20 mg total) by mouth 2 (two) times daily. Patient not taking: Reported on 07/04/2020 05/29/20   Montine Circle, PA-C  gabapentin (NEURONTIN) 600 MG tablet Take 600 mg by mouth 3 (three) times daily. 05/12/19   [provider]  glucose blood (CONTOUR NEXT TEST) test strip USE TO TEST BLOOD SUGAR TWICE DAILY 06/20/20   Elayne Snare, MD  hyoscyamine (LEVSIN) 0.125 MG tablet Take 0.125 mg by mouth every 4 (four) hours as needed for bladder spasms. 11/28/19   [provider]  insulin glargine (LANTUS SOLOSTAR) 100 UNIT/ML Solostar Pen ADMINISTER 54 UNITS UNDER THE SKIN EVERY DAY Patient taking differently: Inject 60 Units into the skin at bedtime. 04/30/20   Elayne Snare, MD  insulin glargine (LANTUS  SOLOSTAR) 100 UNIT/ML Solostar Pen ADMINISTER [place] SKIN EVERY DAY Patient not taking: Reported on 07/04/2020 04/30/20   Elayne Snare, MD  levothyroxine (SYNTHROID) 75 MCG tablet TAKE 1 TABLET BY MOUTH EVERY DAY BEFORE BREAKFAST Patient taking differently: Take 75  mcg by mouth daily before breakfast. 02/22/20   Elayne Snare, MD  lithium carbonate (ESKALITH) 450 MG CR tablet Take 1 tablet (450 mg total) by mouth at bedtime. 06/17/20   [name], PMHNP  loperamide (IMODIUM A-D) 2 MG tablet Take 2 mg by mouth 2 (two) times daily as needed for diarrhea or loose stools.    [provider]  losartan (COZAAR) 25 MG tablet Take 25 mg by mouth daily. 08/31/19   [provider]  Melatonin 10 MG TBCR Take 10 mg by mouth at bedtime.    [provider]  memantine (NAMENDA) 10 MG tablet TAKE 1 TABLET(10 MG) BY MOUTH EVERY EVENING 06/17/20   [name], PMHNP  methocarbamol (ROBAXIN) 500 MG tablet Take 1 tablet (500 mg total) by mouth every 6 (six) hours as needed for muscle spasms. 02/08/19   Danae Orleans, PA-C  metoCLOPramide (REGLAN) 5 MG tablet Take 1 tablet (5 mg total) by mouth 3 (three) times daily before meals. 07/04/20   Elayne Snare, MD  Microlet Lancets MISC TEST DAILY AS DIRECTED 06/20/20   Elayne Snare, MD  nitroGLYCERIN (NITROSTAT) 0.4 MG SL tablet Place 1 tablet (0.4 mg total) under the tongue every 5 (five) minutes as needed for chest pain. 09/14/17   Lyda Jester M, PA-C  ondansetron (ZOFRAN) 4 MG tablet Take 4 mg by mouth every 8 (eight) hours as needed for nausea or vomiting. 11/26/19   [provider]  oxyCODONE (OXY IR/ROXICODONE) 5 MG immediate release tablet Take 1 tablet (5 mg total) by mouth every 4 (four) hours as needed for severe pain. Patient not taking: Reported on 07/04/2020 12/10/19   Dwyane Dee, MD  pantoprazole (PROTONIX) 40 MG tablet Take 1 tablet (40 mg total) by mouth daily. Patient not taking: Reported on 07/04/2020  12/10/19   Dwyane Dee, MD  QUEtiapine (SEROQUEL) 200 MG tablet TAKE 1 TAB PO q 5 pm and 1 tab po QHS 06/17/20   [name], PMHNP  rOPINIRole (REQUIP) 0.5 MG tablet Takes 4 tabs at 5 pm and 3 tabs at bedtime and 1-2 tabs prn 06/17/20   [name], PMHNP  Semaglutide, 1 MG/DOSE, (OZEMPIC, 1 MG/DOSE,) 4 MG/3ML SOPN Inject 1 mg into the skin once a week. Take on Saturday Patient not taking: Reported on 07/04/2020 02/14/20   Elayne Snare, MD  Vitamin D, Ergocalciferol, (DRISDOL) 1.25 MG (50000 UNIT) CAPS capsule Take 50,000 Units by mouth once a week. 04/19/20   [provider]    Physical Exam: Vitals:   07/10/20 2130 07/10/20 2200 07/10/20 2230 07/10/20 2300  BP: 140/69 (!) 144/71 (!) 159/87 (!) 155/74  Pulse: 64 77 71 (!) 57  Resp:      Temp:      TempSrc:      SpO2: 91% 90% 96% 94%    Constitutional: NAD, calm, comfortable Vitals:   07/10/20 2130 07/10/20 2200 07/10/20 2230 07/10/20 2300  BP: 140/69 (!) 144/71 (!) 159/87 (!) 155/74  Pulse: 64 77 71 (!) 57  Resp:      Temp:      TempSrc:      SpO2: 91% 90% 96% 94%   General: WDWN, Alert and oriented x3.  Eyes: EOMI, PERRL, conjunctivae normal.  Sclera nonicteric HENT:  [place]/AT, external ears normal.  Nares patent without epistasis.  Mucous membranes are moist.   Neck: Soft, normal range of motion, supple, no masses, no thyromegaly.  Trachea midline Respiratory: clear to auscultation bilaterally, no wheezing, no crackles. Normal respiratory effort.  No accessory muscle use.  Cardiovascular: Regular rate and rhythm, no murmurs / rubs / gallops. Mild pedal edema. 2+ pedal pulses.  Abdomen: Soft, Diffuse upper abdominal tenderness, nondistended, no rebound or guarding.  No masses palpated.  Bowel sounds hypoactive. Musculoskeletal: FROM. no cyanosis. No joint deformity upper and lower extremities. Normal muscle tone.  Skin: Warm, dry, intact no rashes, lesions, ulcers. No induration Neurologic: CN 2-12 grossly intact.   Normal speech.  Sensation intact,  Strength 5/5 in all extremities.   Psychiatric: Normal judgment and insight.  Normal mood.    Labs on Admission: I have personally reviewed following labs and imaging studies  CBC: Recent Labs  Lab 07/10/20 1611  WBC 8.8  NEUTROABS 6.2  HGB 13.8  HCT 44.2  MCV 91.9  PLT 937    Basic Metabolic Panel: Recent Labs  Lab 07/10/20 1611  NA 139  K 4.2  CL 107  CO2 25  GLUCOSE 169*  BUN 9  CREATININE 0.89  CALCIUM 9.8    GFR: Estimated Creatinine Clearance: 57.3 mL/min (by C-G formula based on SCr of 0.89 mg/dL).  Liver Function Tests: Recent Labs  Lab 07/10/20 1611  AST 21  ALT 24  ALKPHOS 83  BILITOT 0.5  PROT 7.1  ALBUMIN 3.9    Urine analysis:    Component Value Date/Time   COLORURINE YELLOW 07/10/2020 1618   APPEARANCEUR CLEAR 07/10/2020 1618   LABSPEC 1.011 07/10/2020 1618   PHURINE 6.0 07/10/2020 1618   GLUCOSEU 50 (A) 07/10/2020 1618   GLUCOSEU >=1000 (A) 07/01/2020 1555   HGBUR NEGATIVE 07/10/2020 1618   BILIRUBINUR NEGATIVE 07/10/2020 1618   BILIRUBINUR neg 09/18/2011 1606   KETONESUR NEGATIVE 07/10/2020 1618   PROTEINUR NEGATIVE 07/10/2020 1618   UROBILINOGEN 0.2 07/01/2020 1555   NITRITE NEGATIVE 07/10/2020 1618   LEUKOCYTESUR TRACE (A) 07/10/2020 1618    Radiological Exams on Admission: CT ABDOMEN PELVIS W CONTRAST  Result Date: 07/10/2020 CLINICAL DATA:  Diverticulitis, unspecified abdominal pain EXAM: CT ABDOMEN AND PELVIS WITH CONTRAST TECHNIQUE: Multidetector CT imaging of the abdomen and pelvis was performed using the standard protocol following bolus administration of intravenous contrast. CONTRAST:  158mL OMNIPAQUE IOHEXOL 300 MG/ML  SOLN COMPARISON:  05/30/2020 FINDINGS: Lower chest: No acute abnormality. Hepatobiliary: No focal liver abnormality is seen. No gallstones, gallbladder wall thickening, or biliary dilatation. Pancreas: There is a developing hypodensity within the head of the pancreas  abutting the portal vein with effacement of the fat plane between the adjacent vessel. This is not optimally characterized on this examination, however, a a primary pancreatic mass is difficult to exclude. The pancreatic duct is mildly dilated, measuring 6 mm in diameter within the head of the pancreas, extending to the ampulla. This appears slightly progressive since prior examination. No peripancreatic inflammatory change. No peripancreatic adenopathy. No significant parenchymal atrophy. Spleen: Unremarkable Adrenals/Urinary Tract: Adrenal glands are unremarkable. Kidneys are normal, without renal calculi, focal lesion, or hydronephrosis. Bladder is unremarkable. Stomach/Bowel: Moderate sigmoid diverticulosis is present. The stomach, small bowel, and large bowel, however, are otherwise unremarkable. No evidence of obstruction or focal inflammation. The appendix is absent. No free intraperitoneal gas or fluid. Vascular/Lymphatic: Minimal atherosclerotic calcification noted within the abdominal aorta. No aortic aneurysm. No pathologic adenopathy within the abdomen and pelvis. Reproductive: Status post hysterectomy. No adnexal masses. Other: Tiny fat containing umbilical hernia. The rectum is unremarkable. Musculoskeletal: No acute bone abnormality. Degenerative changes are seen within the lumbar spine. Right total hip arthroplasty has been performed. IMPRESSION: No acute intra-abdominal  pathology identified. No definite radiographic explanation for the patient's reported symptoms. Vague hypodensity identified within the head of the pancreas. Dedicated contrast enhanced CT or MRI examination is recommended to exclude the presence of a primary pancreatic mass in this location. Progressive, mild dilation of the main pancreatic duct to the level of the ampulla. This can be seen the setting of a obstructing process such as ampullary stenosis, but is nonspecific. No associated atrophy of the pancreas, peripancreatic  inflammatory change, or parenchymal calcifications. Moderate sigmoid diverticulosis. No superimposed inflammatory change. Aortic Atherosclerosis (ICD10-I70.0). Electronically Signed   By: Fidela Salisbury MD   On: 07/10/2020 19:01     Assessment/Plan Principal Problem:   Intractable abdominal pain Ms. Mulcahey is admitted to med/surg floor.  Patient required morphine IV in the emergency room to obtain pain control.  We will continue with IV morphine as needed for pain overnight. CT of the abdomen did not show any colitis or acute diverticulitis to explain pain.  Have abnormality of the pancreatic head with pancreatic duct dilatation that radiology recommended MRI with contrast to further delineate and identify  Active Problems:   Pancreatic abnormality Obtain MRI with contrast in the morning to further evaluate Gastroenterology, [facility], was consulted by the ER physician.    Diabetes mellitus type 2 in obese Continue home dose of Lantus.  Monitor blood sugars with meals and bedtime.  Sliding scale insulin provide as needed for glycemic control. Patient had hemoglobin A1c last week at her endocrinolgist office which was 6.8 she states    Essential hypertension Continue Cozaar.  Monitor blood pressure    Hypothyroidism Chronic.  Continue levothyroxine    DVT prophylaxis: lovenox for DVT prophylaxis.  Code Status:   Full code  Family Communication:  Diagnosis and plan discussed with the patient.  Patient verbalized understanding and agrees with plan.  Further recommendations to follow as clinically indicated Disposition Plan:   Patient is from:  Home  Anticipated DC to:  Home  Anticipated DC date:  Anticipate 2 midnights in the hospital to treat acute condition  Anticipated DC barriers: No barriers to discharge identified at this time  Consults:  [facility] was consulted by ER physician will see patient in the morning Admission status:  Inpatient  Yevonne Aline Ripken Rekowski  MD [facility]  How to contact the [facility] Attending or Consulting provider [place] or covering provider during after hours [place], for this patient?   1. Check the care team in [facility] and look for a) attending/consulting [facility] provider listed and b) the [facility] team listed 2. Log into [email or web address] and use [place]'s universal password to access. If you do not have the password, please contact the hospital operator. 3. Locate the [facility] provider you are looking for under [facility] and page to a number that you can be directly reached. 4. If you still have difficulty reaching the provider, please page the [facility] (Director on Call) for the Hospitalists listed on amion for assistance.  07/10/2020, 11:06 PM

## 2020-07-10 NOTE — ED Provider Notes (Signed)
Emergency Medicine Provider Triage Evaluation Note  [name] HOSELTON , a 75 y.o. female  was evaluated in triage.  Pt complains of abdominal pain.  Reports symptoms started Sunday and she has been having progressively worsening generalized cramping abdominal pain that seems to be worse in the left lower quadrant.  Has a history of diverticulitis and colitis and this feels similar.  Symptoms associated with lots of diarrhea, nonbloody.  No vomiting or fever  Review of Systems  Positive: Abdominal pain, diarrhea Negative: Vomiting, fever  Physical Exam  BP (!) 172/82 (BP Location: Left Arm)   Pulse 100   Temp 98.5 F (36.9 C) (Oral)   Resp 18   SpO2 95%  Gen:   Awake, no distress   Resp:  Normal effort  MSK:   Moves extremities without difficulty  Other:  Abdomen with mild generalized tenderness, worse in the left lower quadrant  Medical Decision Making  Medically screening exam initiated at 3:10 PM.  Appropriate orders placed.  [name] was informed that the remainder of the evaluation will be completed by another provider, this initial triage assessment does not replace that evaluation, and the importance of remaining in the ED until their evaluation is complete.     Jacqlyn Larsen, PA-C 07/10/20 1517    Wyvonnia Dusky, MD 07/11/20 1041

## 2020-07-10 NOTE — ED Notes (Signed)
Urine culture sent to lab.

## 2020-07-11 ENCOUNTER — Inpatient Hospital Stay (HOSPITAL_COMMUNITY): Payer: Medicare Other

## 2020-07-11 DIAGNOSIS — E039 Hypothyroidism, unspecified: Secondary | ICD-10-CM

## 2020-07-11 DIAGNOSIS — E1169 Type 2 diabetes mellitus with other specified complication: Secondary | ICD-10-CM

## 2020-07-11 DIAGNOSIS — Q453 Other congenital malformations of pancreas and pancreatic duct: Secondary | ICD-10-CM

## 2020-07-11 DIAGNOSIS — I10 Essential (primary) hypertension: Secondary | ICD-10-CM

## 2020-07-11 DIAGNOSIS — F319 Bipolar disorder, unspecified: Secondary | ICD-10-CM

## 2020-07-11 DIAGNOSIS — F341 Dysthymic disorder: Secondary | ICD-10-CM

## 2020-07-11 DIAGNOSIS — R109 Unspecified abdominal pain: Secondary | ICD-10-CM | POA: Diagnosis not present

## 2020-07-11 DIAGNOSIS — E669 Obesity, unspecified: Secondary | ICD-10-CM

## 2020-07-11 LAB — CBC
HCT: 41.5 % (ref 36.0–46.0)
Hemoglobin: 13.1 g/dL (ref 12.0–15.0)
MCH: 29.2 pg (ref 26.0–34.0)
MCHC: 31.6 g/dL (ref 30.0–36.0)
MCV: 92.4 fL (ref 80.0–100.0)
Platelets: 179 10*3/uL (ref 150–400)
RBC: 4.49 MIL/uL (ref 3.87–5.11)
RDW: 13.7 % (ref 11.5–15.5)
WBC: 8.4 10*3/uL (ref 4.0–10.5)
nRBC: 0 % (ref 0.0–0.2)

## 2020-07-11 LAB — GLUCOSE, CAPILLARY
Glucose-Capillary: 98 mg/dL (ref 70–99)
Glucose-Capillary: 135 mg/dL — ABNORMAL HIGH (ref 70–99)
Glucose-Capillary: 134 mg/dL — ABNORMAL HIGH (ref 70–99)
Glucose-Capillary: 102 mg/dL — ABNORMAL HIGH (ref 70–99)

## 2020-07-11 LAB — COMPREHENSIVE METABOLIC PANEL
ALT: 20 U/L (ref 0–44)
AST: 19 U/L (ref 15–41)
Albumin: 3.4 g/dL — ABNORMAL LOW (ref 3.5–5.0)
Alkaline Phosphatase: 62 U/L (ref 38–126)
Anion gap: 10 (ref 5–15)
BUN: 10 mg/dL (ref 8–23)
CO2: 25 mmol/L (ref 22–32)
Calcium: 9.4 mg/dL (ref 8.9–10.3)
Chloride: 107 mmol/L (ref 98–111)
Creatinine, Ser: 0.63 mg/dL (ref 0.44–1.00)
GFR, Estimated: >60 mL/min (ref >60)
Glucose, Bld: 87 mg/dL (ref 70–99)
Potassium: 3.7 mmol/L (ref 3.5–5.1)
Sodium: 142 mmol/L (ref 135–145)
Total Bilirubin: 0.3 mg/dL (ref 0.3–1.2)
Total Protein: 6 g/dL — ABNORMAL LOW (ref 6.5–8.1)

## 2020-07-11 LAB — LITHIUM LEVEL: Lithium Lvl: 0.24 mmol/L — ABNORMAL LOW (ref 0.60–1.20)

## 2020-07-11 MED ORDER — GABAPENTIN 300 MG PO CAPS
600.0000 mg | ORAL_CAPSULE | Freq: Three times a day (TID) | ORAL | Status: DC
  Administered 2020-07-11 (×2): 600 mg via ORAL
  Filled 2020-07-11 (×2): qty 2

## 2020-07-11 MED ORDER — GADOBUTROL 1 MMOL/ML IV SOLN
8.5000 mL | Freq: Once | INTRAVENOUS | Status: AC | PRN
  Administered 2020-07-11: 8.5 mL via INTRAVENOUS

## 2020-07-11 MED ORDER — AMLODIPINE BESYLATE 5 MG PO TABS: 5.0000 mg | ORAL_TABLET | Freq: Every day | ORAL | Status: DC

## 2020-07-11 MED ORDER — ROPINIROLE HCL 1 MG PO TABS
1.5000 mg | ORAL_TABLET | Freq: Every day | ORAL | Status: DC
  Administered 2020-07-11: 1.5 mg via ORAL
  Filled 2020-07-11: qty 2

## 2020-07-11 MED ORDER — DIVALPROEX SODIUM 250 MG PO DR TAB
500.0000 mg | DELAYED_RELEASE_TABLET | Freq: Every day | ORAL | Status: DC
  Administered 2020-07-11: 500 mg via ORAL
  Filled 2020-07-11: qty 2

## 2020-07-11 MED ORDER — ROPINIROLE HCL 1 MG PO TABS
2.0000 mg | ORAL_TABLET | ORAL | Status: DC
  Administered 2020-07-11: 2 mg via ORAL
  Filled 2020-07-11: qty 2

## 2020-07-11 MED ORDER — ROPINIROLE HCL 1 MG PO TABS: 0.5000 mg | ORAL_TABLET | ORAL | Status: DC | PRN

## 2020-07-11 MED ORDER — LITHIUM CARBONATE ER 450 MG PO TBCR
450.0000 mg | EXTENDED_RELEASE_TABLET | Freq: Every day | ORAL | Status: DC
  Administered 2020-07-11: 450 mg via ORAL
  Filled 2020-07-11: qty 1

## 2020-07-11 MED ORDER — QUETIAPINE FUMARATE 200 MG PO TABS
200.0000 mg | ORAL_TABLET | ORAL | Status: DC
  Administered 2020-07-11 (×2): 200 mg via ORAL
  Filled 2020-07-11 (×2): qty 1

## 2020-07-11 MED ORDER — LOSARTAN POTASSIUM 50 MG PO TABS: 50.0000 mg | ORAL_TABLET | Freq: Every day | ORAL | Status: DC

## 2020-07-11 MED ORDER — LABETALOL HCL 5 MG/ML IV SOLN
10.0000 mg | INTRAVENOUS | Status: DC | PRN
  Filled 2020-07-11: qty 4

## 2020-07-11 MED ORDER — QUETIAPINE FUMARATE 200 MG PO TABS: 200.0000 mg | ORAL_TABLET | Freq: Every day | ORAL | Status: DC

## 2020-07-11 MED ORDER — METOCLOPRAMIDE HCL 5 MG PO TABS
5.0000 mg | ORAL_TABLET | Freq: Three times a day (TID) | ORAL | Status: DC
  Administered 2020-07-11 – 2020-07-12 (×2): 5 mg via ORAL
  Filled 2020-07-11 (×2): qty 1

## 2020-07-11 MED ORDER — ROPINIROLE HCL 1 MG PO TABS: 1.0000 mg | ORAL_TABLET | Freq: Every day | ORAL | Status: DC

## 2020-07-11 MED ORDER — INSULIN GLARGINE 100 UNIT/ML ~~LOC~~ SOLN
54.0000 [IU] | Freq: Every day | SUBCUTANEOUS | Status: DC
  Administered 2020-07-11: 27 [IU] via SUBCUTANEOUS
  Filled 2020-07-11: qty 0.54

## 2020-07-11 NOTE — Evaluation (Signed)
Physical Therapy Evaluation Patient Details Name: [name] [name] MRN: [id number] DOB: [birth year] Today's Date: 07/11/2020   History of Present Illness  [name] [name] is a 75 yo female with c/o abdominal pain and diarrhea. CT yesterday revealed vague hypodensity within the head of the pancreas and progressive mild dilation of the main pancreatic duct. PMH: anxiety, bipolar disorder, colitis, depression, fibromyalgia, HTN, IBS, lupus, OA, diabetes, R THA 06/2013, L TKA 02/2019    Clinical Impression  Pt admitted with above diagnosis. Pt independent at baseline. Pt currently ambulating 156ft holding onto handrail in hallway or reaching for furniture in room, limited due to headache. Pt without LOB, no knee buckling noted, declines RW use for steadying. Educated pt on OOB duration and ambulation with nursing as able and pt verbalizes agreement.  Pt currently with functional limitations due to the deficits listed below (see PT Problem List). Pt will benefit from skilled PT to increase their independence and safety with mobility to allow discharge to the venue listed below.       Follow Up Recommendations No PT follow up    Equipment Recommendations  None recommended by PT    Recommendations for Other Services       Precautions / Restrictions Precautions Precautions: None Restrictions Weight Bearing Restrictions: No      Mobility  Bed Mobility Overal bed mobility: Independent     Transfers Overall transfer level: Modified independent Equipment used: None  General transfer comment: powers to stand with bil hands assisting  Ambulation/Gait Ambulation/Gait assistance: Supervision Gait Distance (Feet): 100 Feet Assistive device: None (hallway handrail) Gait Pattern/deviations: Step-through pattern;Decreased stride length Gait velocity: decreased   General Gait Details: pt ambulates in hallway placing hand on handrail and reaching towards furniture in room to steady self,  declines SPC or RW use, no LOB  Stairs            Wheelchair Mobility    Modified Rankin (Stroke Patients Only)       Balance Overall balance assessment: Needs assistance   Sitting balance-Leahy Scale: Good Sitting balance - Comments: seated EOB  Standing balance-Leahy Scale: Fair Standing balance comment: prefers single UE support with ambulation, able to complete without         Pertinent Vitals/Pain Pain Assessment: Faces Faces Pain Scale: Hurts little more Pain Location: headache Pain Descriptors / Indicators: Headache Pain Intervention(s): Limited activity within patient's tolerance;Monitored during session    Home Living Family/patient expects to be discharged to:: Private residence Living Arrangements: Spouse/significant other Available Help at Discharge: Family;Available PRN/intermittently Type of Home: House Home Access: Stairs to enter Entrance Stairs-Rails: Right Entrance Stairs-Number of Steps: 3 Home Layout: Two level;Able to live on main level with bedroom/bathroom Home Equipment: Gilford Rile - 2 wheels;Cane - single point      Prior Function Level of Independence: Independent  Comments: Pt reports independent with community ambulation, ADLs/IADLs, drives, denies falls. Navigates steps with step to pattern due to chronic L knee pain, denies buckling or falls     Hand Dominance   Dominant Hand: Right    Extremity/Trunk Assessment   Upper Extremity Assessment Upper Extremity Assessment: Overall WFL for tasks assessed    Lower Extremity Assessment Lower Extremity Assessment: Overall WFL for tasks assessed (AROM WNL, MMT 4/5 throughout, numbness in bil plantar surface)    Cervical / Trunk Assessment Cervical / Trunk Assessment: Normal  Communication   Communication: No difficulties  Cognition Arousal/Alertness: Awake/alert Behavior During Therapy: WFL for tasks assessed/performed Overall Cognitive Status: Within Functional  Limits for tasks  assessed       General Comments      Exercises     Assessment/Plan    PT Assessment Patient needs continued PT services  PT Problem List Decreased activity tolerance;Decreased balance;Impaired sensation       PT Treatment Interventions DME instruction;Gait training;Stair training;Functional mobility training;Therapeutic activities;Therapeutic exercise;Balance training;Patient/family education    PT Goals (Current goals can be found in the Care Plan section)  Acute Rehab PT Goals Patient Stated Goal: "get back to normal life" PT Goal Formulation: With patient Time For Goal Achievement: 07/25/20 Potential to Achieve Goals: Good    Frequency Min 3X/week   Barriers to discharge        Co-evaluation               AM-PAC PT "6 Clicks" Mobility  Outcome Measure Help needed turning from your back to your side while in a flat bed without using bedrails?: None Help needed moving from lying on your back to sitting on the side of a flat bed without using bedrails?: None Help needed moving to and from a bed to a chair (including a wheelchair)?: None Help needed standing up from a chair using your arms (e.g., wheelchair or bedside chair)?: None Help needed to walk in hospital room?: A Little Help needed climbing 3-5 steps with a railing? : A Little 6 Click Score: 22    End of Session   Activity Tolerance: Patient tolerated treatment well Patient left: in chair;with call bell/phone within reach Nurse Communication: Mobility status PT Visit Diagnosis: Difficulty in walking, not elsewhere classified (R26.2)    Time: 3546-5681 PT Time Calculation (min) (ACUTE ONLY): 12 min   Charges:   PT Evaluation $PT Eval Low Complexity: 1 Low           Tori Braedon Sjogren PT, DPT 07/11/20, 2:09 PM

## 2020-07-11 NOTE — Plan of Care (Signed)
  Problem: Safety: Goal: Ability to remain free from injury will improve Description: Patient will be free from injury by 5/9 Outcome: Progressing
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
## 2020-07-11 NOTE — Progress Notes (Signed)
PROGRESS NOTE  [name] RIEDEL ZHG:[id number] DOB: [birth year]   PCP: Farris Has, MD  Patient is from: Home  DOA: 07/10/2020 LOS: 1  Chief complaints: Abdominal pain  Brief Narrative / Interim history: 75 year old F with PMH of DM-2, HTN, diverticulitis, colitis, gastroparesis, anxiety, depression and bipolar disorder presenting with abdominal pain and diarrhea for 3 days.  She recently returned from vacation in [place].  No sick contact or other family member with similar illness.  She has no fever, chills or URI symptoms.  Reportedly recent colonoscopy and endoscopy without significant finding other than "gastroparesis".  In ED, hemodynamically stable with normal saturation.  Afebrile.  Work-up including CMP, CBC, lipase and UA without significant finding.  CT abdomen and pelvis showed abnormal haziness around the pancreatic head with some dilation of pancreatic duct.  Radiology recommended MRI for further evaluation.  GI consulted.  Subjective: Seen and examined earlier this morning.  No major events overnight of this morning.  Reports improvement in her pain.  She rates her pain 4/10.  Pain is all over.  She describes it as sharp and cramping.  Also reports generalized weakness.  She denies nausea or vomiting.  No further diarrhea since 2 PM yesterday.  She denies UTI symptoms.  She denies alcohol.   Objective: Vitals:   07/11/20 0316 07/11/20 0810 07/11/20 1113 07/11/20 1312  BP: (!) 146/78 (!) 154/68 (!) 148/66 (!) 180/74  Pulse: (!) 55 70 60 70  Resp: 16 18 17 16   Temp: 97.9 F (36.6 C) (!) 97.5 F (36.4 C) 97.6 F (36.4 C) 97.9 F (36.6 C)  TempSrc: Oral Oral Oral Oral  SpO2: 93% 93% 93% 95%    Intake/Output Summary (Last 24 hours) at 07/11/2020 1340 Last data filed at 07/11/2020 0900 Gross per 24 hour  Intake 750.06 ml  Output --  Net 750.06 ml   There were no vitals filed for this visit.  Examination:  GENERAL: No apparent distress.  Nontoxic. HEENT: MMM.  Vision  and hearing grossly intact.  NECK: Supple.  No apparent JVD.  RESP: On RA.  No IWOB.  Fair aeration bilaterally. CVS:  RRR. Heart sounds normal.  ABD/GI/GU: BS+. Abd soft.  Mild diffuse tenderness. MSK/EXT:  Moves extremities. No apparent deformity.  Trace edema. SKIN: no apparent skin lesion or wound NEURO: Awake, alert and oriented appropriately.  No apparent focal neuro deficit. PSYCH: Calm. Normal affect.   Procedures:  None  Microbiology summarized: COVID-19 and influenza PCR nonreactive. GI panel pending.  Assessment & Plan: Diarrhea/abdominal pain-diarrhea seems to have resolved.  Abdominal pain improved.  Unclear etiology but some concern about viral gastroenteritis given recent travel.  Basic labs and lipase unrevealing.  CT abdomen and pelvis with some haziness about the pancreatic head and pancreatic duct dilation. -MRI/MRCP pending. -Follow GI panel -Appreciate help by GI  Abnormal CT abdomen and pelvis with pancreatic abnormality as above -Work-up as above.  Controlled IDDM-2 with neuropathy and gastroparesis: A1c 6.8% on 4/14. Recent Labs  Lab 07/10/20 1803 07/10/20 2331 07/11/20 0741 07/11/20 1203  GLUCAP 143* 98 98 135*  -Decrease Lantus to 54 units-Home dose. -Continue SSI-moderate -Continue home statin  Essential hypertension: BP slightly elevated. -Discontinue IV fluid -Increase Cozaar to 50 mg daily -As needed labetalol   Hypothyroidism -Continue home Synthroid  Anxiety/depression/bipolar disorder: Stable -Continue home medications -Check lithium level.  History of dementia?  Seems to be mentating well.  No behavioral disturbance. -Continue home Aricept and Namenda.  There is no height or weight  on file to calculate BMI.         DVT prophylaxis:  enoxaparin (LOVENOX) injection 40 mg Start: 07/11/20 1000  Code Status: Full code Family Communication: Updated patient's husband over the phone. Level of care: Med-Surg Status is:  Inpatient  Remains inpatient appropriate because:Ongoing diagnostic testing needed not appropriate for outpatient work up, Unsafe d/c plan and Inpatient level of care appropriate due to severity of illness   Dispo: The patient is from: Home              Anticipated d/c is to: Home              Patient currently is not medically stable to d/c.   Difficult to place patient No       Consultants:  GI   Sch Meds:  Scheduled Meds: . atorvastatin  40 mg Oral QPM  . divalproex  500 mg Oral QHS  . donepezil  10 mg Oral QHS  . enoxaparin (LOVENOX) injection  40 mg Subcutaneous Q24H  . gabapentin  600 mg Oral TID  . insulin aspart  0-15 Units Subcutaneous TID WC  . insulin aspart  0-5 Units Subcutaneous QHS  . insulin glargine  54 Units Subcutaneous QHS  . levothyroxine  75 mcg Oral Q0600  . lithium carbonate  450 mg Oral QHS  . [START ON 07/12/2020] losartan  50 mg Oral Daily  . melatonin  10 mg Oral QHS  . memantine  10 mg Oral QHS  . metoCLOPramide  5 mg Oral TID AC  . QUEtiapine  200 mg Oral QHS  . rOPINIRole  1 mg Oral QHS   Continuous Infusions:  PRN Meds:.acetaminophen **OR** acetaminophen, ALPRAZolam, labetalol, morphine injection, ondansetron **OR** ondansetron (ZOFRAN) IV  Antimicrobials: Anti-infectives (From admission, onward)   None       I have personally reviewed the following labs and images: CBC: Recent Labs  Lab 07/10/20 1611 07/11/20 0543  WBC 8.8 8.4  NEUTROABS 6.2  --   HGB 13.8 13.1  HCT 44.2 41.5  MCV 91.9 92.4  PLT 183 179   BMP &GFR Recent Labs  Lab 07/10/20 1611 07/11/20 0543  NA 139 142  K 4.2 3.7  CL 107 107  CO2 25 25  GLUCOSE 169* 87  BUN 9 10  CREATININE 0.89 0.63  CALCIUM 9.8 9.4   Estimated Creatinine Clearance: 63.7 mL/min (by C-G formula based on SCr of 0.63 mg/dL). Liver & Pancreas: Recent Labs  Lab 07/10/20 1611 07/11/20 0543  AST 21 19  ALT 24 20  ALKPHOS 83 62  BILITOT 0.5 0.3  PROT 7.1 6.0*  ALBUMIN 3.9  3.4*   Recent Labs  Lab 07/10/20 1611  LIPASE 33   No results for input(s): AMMONIA in the last 168 hours. Diabetic: No results for input(s): HGBA1C in the last 72 hours. Recent Labs  Lab 07/10/20 1803 07/10/20 2331 07/11/20 0741 07/11/20 1203  GLUCAP 143* 98 98 135*   Cardiac Enzymes: No results for input(s): CKTOTAL, CKMB, CKMBINDEX, TROPONINI in the last 168 hours. No results for input(s): PROBNP in the last 8760 hours. Coagulation Profile: No results for input(s): INR, PROTIME in the last 168 hours. Thyroid Function Tests: No results for input(s): TSH, T4TOTAL, FREET4, T3FREE, THYROIDAB in the last 72 hours. Lipid Profile: No results for input(s): CHOL, HDL, LDLCALC, TRIG, CHOLHDL, LDLDIRECT in the last 72 hours. Anemia Panel: No results for input(s): VITAMINB12, FOLATE, FERRITIN, TIBC, IRON, RETICCTPCT in the last 72 hours. Urine analysis:  Component Value Date/Time   COLORURINE YELLOW 07/10/2020 1618   APPEARANCEUR CLEAR 07/10/2020 1618   LABSPEC 1.011 07/10/2020 1618   PHURINE 6.0 07/10/2020 1618   GLUCOSEU 50 (A) 07/10/2020 1618   GLUCOSEU >=1000 (A) 07/01/2020 1555   HGBUR NEGATIVE 07/10/2020 [place] 07/10/2020 1618   BILIRUBINUR neg 09/18/2011 1606   KETONESUR NEGATIVE 07/10/2020 1618   PROTEINUR NEGATIVE 07/10/2020 1618   UROBILINOGEN 0.2 07/01/2020 1555   NITRITE NEGATIVE 07/10/2020 1618   LEUKOCYTESUR TRACE (A) 07/10/2020 1618   Sepsis Labs: Invalid input(s): PROCALCITONIN, [place]  Microbiology: Recent Results (from the past 240 hour(s))  Resp Panel by RT-PCR (Flu A&B, Covid) Nasopharyngeal Swab     Status: None   Collection Time: 07/10/20 10:00 PM   Specimen: Nasopharyngeal Swab; Nasopharyngeal(NP) swabs in vial transport medium  Result Value Ref Range Status   SARS Coronavirus 2 by RT PCR NEGATIVE NEGATIVE Final    Comment: (NOTE) SARS-CoV-2 target nucleic acids are NOT DETECTED.  The SARS-CoV-2 RNA is generally  detectable in upper respiratory specimens during the acute phase of infection. The lowest concentration of SARS-CoV-2 viral copies this assay can detect is 138 copies/mL. A negative result does not preclude SARS-Cov-2 infection and should not be used as the sole basis for treatment or other patient management decisions. A negative result may occur with  improper specimen collection/handling, submission of specimen other than nasopharyngeal swab, presence of viral mutation(s) within the areas targeted by this assay, and inadequate number of viral copies(<138 copies/mL). A negative result must be combined with clinical observations, patient history, and epidemiological information. The expected result is Negative.  Fact Sheet for Patients:  EntrepreneurPulse.com.au  Fact Sheet for Healthcare Providers:  IncredibleEmployment.be  This test is no t yet approved or cleared by the [place] FDA and  has been authorized for detection and/or diagnosis of SARS-CoV-2 by FDA under an Emergency Use Authorization (EUA). This EUA will remain  in effect (meaning this test can be used) for the duration of the COVID-19 declaration under Section 564(b)(1) of the Act, 21 U.S.C.section 360bbb-3(b)(1), unless the authorization is terminated  or revoked sooner.       Influenza A by PCR NEGATIVE NEGATIVE Final   Influenza B by PCR NEGATIVE NEGATIVE Final    Comment: (NOTE) The Xpert Xpress SARS-CoV-2/FLU/RSV plus assay is intended as an aid in the diagnosis of influenza from Nasopharyngeal swab specimens and should not be used as a sole basis for treatment. Nasal washings and aspirates are unacceptable for Xpert Xpress SARS-CoV-2/FLU/RSV testing.  Fact Sheet for Patients: EntrepreneurPulse.com.au  Fact Sheet for Healthcare Providers: IncredibleEmployment.be  This test is not yet approved or cleared by the [place] FDA  and has been authorized for detection and/or diagnosis of SARS-CoV-2 by FDA under an Emergency Use Authorization (EUA). This EUA will remain in effect (meaning this test can be used) for the duration of the COVID-19 declaration under Section 564(b)(1) of the Act, 21 U.S.C. section 360bbb-3(b)(1), unless the authorization is terminated or revoked.  Performed at [name], [place] [address], [place], [place] [address]     Radiology Studies: CT ABDOMEN PELVIS W CONTRAST  Result Date: 07/10/2020 CLINICAL DATA:  Diverticulitis, unspecified abdominal pain EXAM: CT ABDOMEN AND PELVIS WITH CONTRAST TECHNIQUE: Multidetector CT imaging of the abdomen and pelvis was performed using the standard protocol following bolus administration of intravenous contrast. CONTRAST:  127mL OMNIPAQUE IOHEXOL 300 MG/ML  SOLN COMPARISON:  05/30/2020 FINDINGS: Lower chest: No acute abnormality.  Hepatobiliary: No focal liver abnormality is seen. No gallstones, gallbladder wall thickening, or biliary dilatation. Pancreas: There is a developing hypodensity within the head of the pancreas abutting the portal vein with effacement of the fat plane between the adjacent vessel. This is not optimally characterized on this examination, however, a a primary pancreatic mass is difficult to exclude. The pancreatic duct is mildly dilated, measuring 6 mm in diameter within the head of the pancreas, extending to the ampulla. This appears slightly progressive since prior examination. No peripancreatic inflammatory change. No peripancreatic adenopathy. No significant parenchymal atrophy. Spleen: Unremarkable Adrenals/Urinary Tract: Adrenal glands are unremarkable. Kidneys are normal, without renal calculi, focal lesion, or hydronephrosis. Bladder is unremarkable. Stomach/Bowel: Moderate sigmoid diverticulosis is present. The stomach, small bowel, and large bowel, however, are otherwise unremarkable. No evidence of obstruction or  focal inflammation. The appendix is absent. No free intraperitoneal gas or fluid. Vascular/Lymphatic: Minimal atherosclerotic calcification noted within the abdominal aorta. No aortic aneurysm. No pathologic adenopathy within the abdomen and pelvis. Reproductive: Status post hysterectomy. No adnexal masses. Other: Tiny fat containing umbilical hernia. The rectum is unremarkable. Musculoskeletal: No acute bone abnormality. Degenerative changes are seen within the lumbar spine. Right total hip arthroplasty has been performed. IMPRESSION: No acute intra-abdominal pathology identified. No definite radiographic explanation for the patient's reported symptoms. Vague hypodensity identified within the head of the pancreas. Dedicated contrast enhanced CT or MRI examination is recommended to exclude the presence of a primary pancreatic mass in this location. Progressive, mild dilation of the main pancreatic duct to the level of the ampulla. This can be seen the setting of a obstructing process such as ampullary stenosis, but is nonspecific. No associated atrophy of the pancreas, peripancreatic inflammatory change, or parenchymal calcifications. Moderate sigmoid diverticulosis. No superimposed inflammatory change. Aortic Atherosclerosis (ICD10-I70.0). Electronically Signed   By: Fidela Salisbury MD   On: 07/10/2020 19:01      Vollie Brunty T. [place]  If 7PM-7AM, please contact night-coverage [email or web address] 07/11/2020, 1:40 PM

## 2020-07-12 ENCOUNTER — Ambulatory Visit: Payer: Medicare Other | Admitting: Psychiatry

## 2020-07-12 DIAGNOSIS — A084 Viral intestinal infection, unspecified: Principal | ICD-10-CM

## 2020-07-12 DIAGNOSIS — R935 Abnormal findings on diagnostic imaging of other abdominal regions, including retroperitoneum: Secondary | ICD-10-CM

## 2020-07-12 LAB — GLUCOSE, CAPILLARY: Glucose-Capillary: 134 mg/dL — ABNORMAL HIGH (ref 70–99)

## 2020-07-12 NOTE — Discharge Instructions (Signed)

## 2020-07-12 NOTE — Discharge Summary (Signed)
Physician Discharge Summary  [name] [name] [id number] DOB: 02/18/1946 DOA: 07/10/2020  PCP: London Pepper, MD  Admit date: 07/10/2020 Discharge date: 07/12/2020  Admitted From: Home. Disposition: Home  Recommendations for Outpatient Follow-up:  1. Follow ups as below. 2. Please obtain CBC/BMP/Mag at follow up 3. Please follow up on the following pending results: None  Home Health: None required Equipment/Devices: None required  Discharge Condition: Stable CODE STATUS: Full code   Follow-up Information    London Pepper, MD. Schedule an appointment as soon as possible for a visit in 1 week(s).   Specialty: Family Medicine Contact information: [address] [place] [address] [phone]                Hospital Course: 75 year old F with PMH of DM-2, HTN, diverticulitis, colitis, gastroparesis, anxiety, depression and bipolar disorder presenting with abdominal pain and diarrhea for 3 days.  She recently returned from vacation in [place].  No sick contact or other family member with similar illness.  She has no fever, chills or URI symptoms.  Reportedly recent colonoscopy and endoscopy without significant finding other than "gastroparesis".  In ED, hemodynamically stable with normal saturation.  Afebrile.  Work-up including CMP, CBC, lipase and UA without significant finding.  CT abdomen and pelvis showed abnormal haziness around the pancreatic head with some dilation of pancreatic duct.  Radiology recommended MRI for further evaluation.  GI consulted.  Patient's symptoms resolved.  MRCP without significant finding.  Cleared for discharge by gastroenterology  Discharge Diagnoses:  Diarrhea/abdominal pain-resolved.  Likely due to viral gastro enteritis given recent travel. Basic labs and lipase unrevealing.  CT abdomen and pelvis with some haziness about the pancreatic head and pancreatic duct dilation but MRCP without significant finding.  Cleared  for discharge by gastroenterology.  Abnormal CT abdomen and pelvis with pancreatic abnormality as above  Controlled IDDM-2 with neuropathy and gastroparesis: A1c 6.8% on 4/14. Recent Labs  Lab 07/11/20 0741 07/11/20 1203 07/11/20 1640 07/11/20 2140 07/12/20 0741  GLUCAP 98 135* 134* 102* 134*  -Continue home medications  Essential hypertension: BP within fair range. -Continue home medications  Hypothyroidism -Continue home Synthroid  Anxiety/depression/bipolar disorder: Stable.  Lithium level low. -Continue home medications  History of dementia?  Seems to be mentating well.  No behavioral disturbance. -Continue home Aricept and Namenda.   There is no height or weight on file to calculate BMI.            Discharge Exam: Vitals:   07/11/20 2110 07/12/20 0528  BP: (!) 147/68 (!) 143/77  Pulse: 80 85  Resp: 16 18  Temp: (!) 97.4 F (36.3 C) 98.1 F (36.7 C)  SpO2: 90% 91%    GENERAL: No apparent distress.  Nontoxic. HEENT: MMM.  Vision and hearing grossly intact.  NECK: Supple.  No apparent JVD.  RESP: On RA.  No IWOB.  Fair aeration bilaterally. CVS:  RRR. Heart sounds normal.  ABD/GI/GU: Bowel sounds present. Soft. Non tender.  MSK/EXT:  Moves extremities. No apparent deformity. No edema.  SKIN: no apparent skin lesion or wound NEURO: Awake, alert and oriented appropriately.  No apparent focal neuro deficit. PSYCH: Calm. Normal affect.   Discharge Instructions  Discharge Instructions    Call MD for:  difficulty breathing, headache or visual disturbances   Complete by: As directed    Call MD for:  extreme fatigue   Complete by: As directed    Call MD for:  persistant nausea and vomiting  Complete by: As directed    Call MD for:  severe uncontrolled pain   Complete by: As directed    Diet - low sodium heart healthy   Complete by: As directed    Diet Carb Modified   Complete by: As directed    Discharge instructions   Complete by: As  directed    It has been a pleasure taking care of you!  You were hospitalized with abdominal pain and diarrhea that seems to have subsided.  Your symptoms could be due to viral illness.  Your MRI did not show anything significant or concerning.  Please follow-up with your primary care doctor in 1 to 2 weeks.  You may follow-up with your gastroenterologist as previously planned.   Take care,   Increase activity slowly   Complete by: As directed      Allergies as of 07/12/2020      Reactions   Codeine Anxiety, Other (See Comments)   Hallucinations, tolerates oxycodone    Procaine Hcl Palpitations   Aspirin Nausea And Vomiting, Other (See Comments)   Reaction:  Burns pts stomach    Benadryl [diphenhydramine] Other (See Comments)   Per MD "inhibits potency of gabapentin, lithium etc"   Diflucan [fluconazole] Other (See Comments)   Unknown reaction   Dilaudid [hydromorphone Hcl] Other (See Comments)   Migraines and nightmares    Other Nausea And Vomiting, Other (See Comments)   Pt states that all -mycins cause N/V. (MACROLIDES)    Tramadol Hcl    Other reaction(s): felt weird   Sulfa Antibiotics Other (See Comments)   Unknown reaction      Medication List    TAKE these medications   acetaminophen 500 MG tablet Commonly known as: TYLENOL Take 1,000 mg by mouth 3 (three) times daily as needed for moderate pain.   ALPRAZolam 0.5 MG tablet Commonly known as: XANAX Take 0.5 mg by mouth 4 (four) times daily as needed for anxiety.   atorvastatin 40 MG tablet Commonly known as: LIPITOR Take 40 mg by mouth every evening.   b complex vitamins capsule Take 1 capsule by mouth daily.   BD Pen Needle Nano 2nd Gen 32G X 4 MM Misc Generic drug: Insulin Pen Needle USE TO INJECT 5 TIMES DAILY AS DIRECTED   celecoxib 200 MG capsule Commonly known as: CELEBREX Take 200 mg by mouth at bedtime.   Contour Next Test test strip Generic drug: glucose blood USE TO TEST BLOOD SUGAR TWICE  DAILY   divalproex 250 MG DR tablet Commonly known as: DEPAKOTE Take 2 tablets (500 mg total) by mouth at bedtime.   donepezil 10 MG tablet Commonly known as: ARICEPT Take 1 tablet (10 mg total) by mouth at bedtime.   gabapentin 600 MG tablet Commonly known as: NEURONTIN Take 600 mg by mouth 3 (three) times daily.   Lantus SoloStar 100 UNIT/ML Solostar Pen Generic drug: insulin glargine ADMINISTER 54 UNITS UNDER THE SKIN EVERY DAY What changed:   how much to take  how to take this  when to take this  additional instructions   levothyroxine 75 MCG tablet Commonly known as: SYNTHROID TAKE 1 TABLET BY MOUTH EVERY DAY BEFORE BREAKFAST What changed: See the new instructions.   lithium carbonate 450 MG CR tablet Commonly known as: ESKALITH Take 1 tablet (450 mg total) by mouth at bedtime.   loperamide 2 MG tablet Commonly known as: IMODIUM A-D Take 2 mg by mouth 2 (two) times daily as needed for diarrhea or  loose stools.   losartan 25 MG tablet Commonly known as: COZAAR Take 25 mg by mouth daily.   Melatonin 10 MG Tbcr Take 10 mg by mouth at bedtime.   memantine 10 MG tablet Commonly known as: NAMENDA TAKE 1 TABLET(10 MG) BY MOUTH EVERY EVENING   metoCLOPramide 5 MG tablet Commonly known as: Reglan Take 1 tablet (5 mg total) by mouth 3 (three) times daily before meals.   Microlet Lancets Misc TEST DAILY AS DIRECTED   nitroGLYCERIN 0.4 MG SL tablet Commonly known as: NITROSTAT Place 1 tablet (0.4 mg total) under the tongue every 5 (five) minutes as needed for chest pain.   ondansetron 4 MG tablet Commonly known as: ZOFRAN Take 4 mg by mouth every 8 (eight) hours as needed for nausea or vomiting.   Ozempic (1 MG/DOSE) 4 MG/3ML Sopn Generic drug: Semaglutide (1 MG/DOSE) Inject 1 mg into the skin once a week. Take on Saturday What changed: how much to take   QUEtiapine 200 MG tablet Commonly known as: SEROQUEL TAKE 1 TAB PO q 5 pm and 1 tab po QHS    rOPINIRole 0.5 MG tablet Commonly known as: REQUIP Takes 4 tabs at 5 pm and 3 tabs at bedtime and 1-2 tabs prn   Vitamin D (Ergocalciferol) 1.25 MG (50000 UNIT) Caps capsule Commonly known as: DRISDOL Take 50,000 Units by mouth once a week.       Consultations:  Gastroenterology  Procedures/Studies:   MR ABDOMEN W WO CONTRAST  Result Date: 07/11/2020 CLINICAL DATA:  Abdominal pain. Possible pancreatic mass on recent CT. EXAM: MRI ABDOMEN WITHOUT AND WITH CONTRAST TECHNIQUE: Multiplanar multisequence MR imaging of the abdomen was performed both before and after the administration of intravenous contrast. CONTRAST:  8.69mL GADAVIST GADOBUTROL 1 MMOL/ML IV SOLN COMPARISON:  CT on 07/10/2020 and 01/16/2020 FINDINGS: Lower chest: No acute findings. Hepatobiliary: No hepatic masses identified. Gallbladder is unremarkable. No evidence of biliary ductal dilatation. Pancreas: Normal appearance of the pancreas. No evidence of pancreatic masses or cysts. No evidence of pancreatic ductal dilatation or or pancreas divisum. No evidence of peripancreatic inflammatory changes fluid collections. Spleen:  Within normal limits in size and appearance. Adrenals/Urinary Tract: No masses identified. Stable mild chronic right renal parenchymal atrophy and pelvicaliectasis. No evidence of hydronephrosis. Stomach/Bowel: Visualized portion unremarkable. Vascular/Lymphatic: No pathologically enlarged lymph nodes identified. No abdominal aortic aneurysm. Other:  None. Musculoskeletal:  No suspicious bone lesions identified. IMPRESSION: No acute findings. Normal appearance of the pancreas. No evidence of pancreatic mass or other significant abnormality. Stable mild chronic right renal parenchymal atrophy. Electronically Signed   By: Marlaine Hind M.D.   On: 07/11/2020 18:13   CT ABDOMEN PELVIS W CONTRAST  Result Date: 07/10/2020 CLINICAL DATA:  Diverticulitis, unspecified abdominal pain EXAM: CT ABDOMEN AND PELVIS WITH  CONTRAST TECHNIQUE: Multidetector CT imaging of the abdomen and pelvis was performed using the standard protocol following bolus administration of intravenous contrast. CONTRAST:  115mL OMNIPAQUE IOHEXOL 300 MG/ML  SOLN COMPARISON:  05/30/2020 FINDINGS: Lower chest: No acute abnormality. Hepatobiliary: No focal liver abnormality is seen. No gallstones, gallbladder wall thickening, or biliary dilatation. Pancreas: There is a developing hypodensity within the head of the pancreas abutting the portal vein with effacement of the fat plane between the adjacent vessel. This is not optimally characterized on this examination, however, a a primary pancreatic mass is difficult to exclude. The pancreatic duct is mildly dilated, measuring 6 mm in diameter within the head of the pancreas, extending to the ampulla.  This appears slightly progressive since prior examination. No peripancreatic inflammatory change. No peripancreatic adenopathy. No significant parenchymal atrophy. Spleen: Unremarkable Adrenals/Urinary Tract: Adrenal glands are unremarkable. Kidneys are normal, without renal calculi, focal lesion, or hydronephrosis. Bladder is unremarkable. Stomach/Bowel: Moderate sigmoid diverticulosis is present. The stomach, small bowel, and large bowel, however, are otherwise unremarkable. No evidence of obstruction or focal inflammation. The appendix is absent. No free intraperitoneal gas or fluid. Vascular/Lymphatic: Minimal atherosclerotic calcification noted within the abdominal aorta. No aortic aneurysm. No pathologic adenopathy within the abdomen and pelvis. Reproductive: Status post hysterectomy. No adnexal masses. Other: Tiny fat containing umbilical hernia. The rectum is unremarkable. Musculoskeletal: No acute bone abnormality. Degenerative changes are seen within the lumbar spine. Right total hip arthroplasty has been performed. IMPRESSION: No acute intra-abdominal pathology identified. No definite radiographic  explanation for the patient's reported symptoms. Vague hypodensity identified within the head of the pancreas. Dedicated contrast enhanced CT or MRI examination is recommended to exclude the presence of a primary pancreatic mass in this location. Progressive, mild dilation of the main pancreatic duct to the level of the ampulla. This can be seen the setting of a obstructing process such as ampullary stenosis, but is nonspecific. No associated atrophy of the pancreas, peripancreatic inflammatory change, or parenchymal calcifications. Moderate sigmoid diverticulosis. No superimposed inflammatory change. Aortic Atherosclerosis (ICD10-I70.0). Electronically Signed   By: Fidela Salisbury MD   On: 07/10/2020 19:01   MR 3D Recon At Scanner  Result Date: 07/11/2020 CLINICAL DATA:  Abdominal pain. Possible pancreatic mass on recent CT. EXAM: MRI ABDOMEN WITHOUT AND WITH CONTRAST TECHNIQUE: Multiplanar multisequence MR imaging of the abdomen was performed both before and after the administration of intravenous contrast. CONTRAST:  8.69mL GADAVIST GADOBUTROL 1 MMOL/ML IV SOLN COMPARISON:  CT on 07/10/2020 and 01/16/2020 FINDINGS: Lower chest: No acute findings. Hepatobiliary: No hepatic masses identified. Gallbladder is unremarkable. No evidence of biliary ductal dilatation. Pancreas: Normal appearance of the pancreas. No evidence of pancreatic masses or cysts. No evidence of pancreatic ductal dilatation or or pancreas divisum. No evidence of peripancreatic inflammatory changes fluid collections. Spleen:  Within normal limits in size and appearance. Adrenals/Urinary Tract: No masses identified. Stable mild chronic right renal parenchymal atrophy and pelvicaliectasis. No evidence of hydronephrosis. Stomach/Bowel: Visualized portion unremarkable. Vascular/Lymphatic: No pathologically enlarged lymph nodes identified. No abdominal aortic aneurysm. Other:  None. Musculoskeletal:  No suspicious bone lesions identified. IMPRESSION:  No acute findings. Normal appearance of the pancreas. No evidence of pancreatic mass or other significant abnormality. Stable mild chronic right renal parenchymal atrophy. Electronically Signed   By: Marlaine Hind M.D.   On: 07/11/2020 18:13        The results of significant diagnostics from this hospitalization (including imaging, microbiology, ancillary and laboratory) are listed below for reference.     Microbiology: Recent Results (from the past 240 hour(s))  Resp Panel by RT-PCR (Flu A&B, Covid) Nasopharyngeal Swab     Status: None   Collection Time: 07/10/20 10:00 PM   Specimen: Nasopharyngeal Swab; Nasopharyngeal(NP) swabs in vial transport medium  Result Value Ref Range Status   SARS Coronavirus 2 by RT PCR NEGATIVE NEGATIVE Final    Comment: (NOTE) SARS-CoV-2 target nucleic acids are NOT DETECTED.  The SARS-CoV-2 RNA is generally detectable in upper respiratory specimens during the acute phase of infection. The lowest concentration of SARS-CoV-2 viral copies this assay can detect is 138 copies/mL. A negative result does not preclude SARS-Cov-2 infection and should not be used as the sole basis for  treatment or other patient management decisions. A negative result may occur with  improper specimen collection/handling, submission of specimen other than nasopharyngeal swab, presence of viral mutation(s) within the areas targeted by this assay, and inadequate number of viral copies(<138 copies/mL). A negative result must be combined with clinical observations, patient history, and epidemiological information. The expected result is Negative.  Fact Sheet for Patients:  EntrepreneurPulse.com.au  Fact Sheet for Healthcare Providers:  IncredibleEmployment.be  This test is no t yet approved or cleared by the [place] FDA and  has been authorized for detection and/or diagnosis of SARS-CoV-2 by FDA under an Emergency Use Authorization  (EUA). This EUA will remain  in effect (meaning this test can be used) for the duration of the COVID-19 declaration under Section 564(b)(1) of the Act, 21 U.S.C.section 360bbb-3(b)(1), unless the authorization is terminated  or revoked sooner.       Influenza A by PCR NEGATIVE NEGATIVE Final   Influenza B by PCR NEGATIVE NEGATIVE Final    Comment: (NOTE) The Xpert Xpress SARS-CoV-2/FLU/RSV plus assay is intended as an aid in the diagnosis of influenza from Nasopharyngeal swab specimens and should not be used as a sole basis for treatment. Nasal washings and aspirates are unacceptable for Xpert Xpress SARS-CoV-2/FLU/RSV testing.  Fact Sheet for Patients: EntrepreneurPulse.com.au  Fact Sheet for Healthcare Providers: IncredibleEmployment.be  This test is not yet approved or cleared by the [place] FDA and has been authorized for detection and/or diagnosis of SARS-CoV-2 by FDA under an Emergency Use Authorization (EUA). This EUA will remain in effect (meaning this test can be used) for the duration of the COVID-19 declaration under Section 564(b)(1) of the Act, 21 U.S.C. section 360bbb-3(b)(1), unless the authorization is terminated or revoked.  Performed at [facility], [place] [address] [place], [place] [address]      Labs:  CBC: Recent Labs  Lab 07/10/20 1611 07/11/20 0543  WBC 8.8 8.4  NEUTROABS 6.2  --   HGB 13.8 13.1  HCT 44.2 41.5  MCV 91.9 92.4  PLT 183 179   BMP &GFR Recent Labs  Lab 07/10/20 1611 07/11/20 0543  NA 139 142  K 4.2 3.7  CL 107 107  CO2 25 25  GLUCOSE 169* 87  BUN 9 10  CREATININE 0.89 0.63  CALCIUM 9.8 9.4   Estimated Creatinine Clearance: 63.7 mL/min (by C-G formula based on SCr of 0.63 mg/dL). Liver & Pancreas: Recent Labs  Lab 07/10/20 1611 07/11/20 0543  AST 21 19  ALT 24 20  ALKPHOS 83 62  BILITOT 0.5 0.3  PROT 7.1 6.0*  ALBUMIN 3.9 3.4*   Recent Labs  Lab  07/10/20 1611  LIPASE 33   No results for input(s): AMMONIA in the last 168 hours. Diabetic: No results for input(s): HGBA1C in the last 72 hours. Recent Labs  Lab 07/11/20 0741 07/11/20 1203 07/11/20 1640 07/11/20 2140 07/12/20 0741  GLUCAP 98 135* 134* 102* 134*   Cardiac Enzymes: No results for input(s): CKTOTAL, CKMB, CKMBINDEX, TROPONINI in the last 168 hours. No results for input(s): PROBNP in the last 8760 hours. Coagulation Profile: No results for input(s): INR, PROTIME in the last 168 hours. Thyroid Function Tests: No results for input(s): TSH, T4TOTAL, FREET4, T3FREE, THYROIDAB in the last 72 hours. Lipid Profile: No results for input(s): CHOL, HDL, LDLCALC, TRIG, CHOLHDL, LDLDIRECT in the last 72 hours. Anemia Panel: No results for input(s): VITAMINB12, FOLATE, FERRITIN, TIBC, IRON, RETICCTPCT in the last 72 hours. Urine analysis:  Component Value Date/Time   COLORURINE YELLOW 07/10/2020 1618   APPEARANCEUR CLEAR 07/10/2020 1618   LABSPEC 1.011 07/10/2020 1618   PHURINE 6.0 07/10/2020 1618   GLUCOSEU 50 (A) 07/10/2020 1618   GLUCOSEU >=1000 (A) 07/01/2020 1555   HGBUR NEGATIVE 07/10/2020 [place] 07/10/2020 1618   BILIRUBINUR neg 09/18/2011 1606   KETONESUR NEGATIVE 07/10/2020 1618   PROTEINUR NEGATIVE 07/10/2020 1618   UROBILINOGEN 0.2 07/01/2020 1555   NITRITE NEGATIVE 07/10/2020 1618   LEUKOCYTESUR TRACE (A) 07/10/2020 1618   Sepsis Labs: Invalid input(s): PROCALCITONIN, LACTICIDVEN   Time coordinating discharge: 35 minutes  SIGNED:  Mercy Riding, MD  [facility] 07/12/2020, 5:11 PM  If 7PM-7AM, please contact night-coverage [email or web address]

## 2020-07-12 NOTE — Plan of Care (Signed)

## 2020-07-12 NOTE — Progress Notes (Signed)
[facility] Progress Note  [name] [name] 75 y.o. [birth year]  Patient was discharged this morning.  MRI/MRCP showed normal appearance of pancreas.  No pancreatic ductal dilation.  No biliary dilation.   Suspect symptoms (acute diarrhea, left lower quadrant abdominal pain) were related to viral gastroenteritis. Pt also has history of IBS.  Patient has a FU with Dr. Therisa Doyne on 5/16.   Salley Slaughter PA-C 07/12/2020, 9:11 AM  Contact #  [phone]

## 2020-07-13 ENCOUNTER — Telehealth: Payer: Self-pay | Admitting: Endocrinology

## 2020-07-15 ENCOUNTER — Other Ambulatory Visit: Payer: Self-pay | Admitting: *Deleted

## 2020-07-15 MED ORDER — OZEMPIC (0.25 OR 0.5 MG/DOSE) 2 MG/1.5ML ~~LOC~~ SOPN: 0.5000 mg | PEN_INJECTOR | SUBCUTANEOUS | 3 refills | Status: DC

## 2020-07-15 MED ORDER — OZEMPIC (1 MG/DOSE) 4 MG/3ML ~~LOC~~ SOPN
0.5000 mg | PEN_INJECTOR | SUBCUTANEOUS | 2 refills | Status: DC
Start: 2020-07-15 — End: 2020-07-15

## 2020-07-15 NOTE — Telephone Encounter (Signed)
I spoke with the pharmacist and was able to get it straightened out.

## 2020-07-15 NOTE — Telephone Encounter (Signed)
Ozempic 0.5 mg weekly.  Please see my note if needed

## 2020-07-15 NOTE — Telephone Encounter (Signed)
Pharmacist called back with one more question-  She states the Ozempic 1 mg pen can only be dosed as 1 mg so she will need a new prescription for another Ozempic pen  [phone] Judson Roch

## 2020-07-15 NOTE — Telephone Encounter (Signed)
Noted, message left for pharmacist.

## 2020-07-15 NOTE — Telephone Encounter (Signed)
She is supposed to get a 0.5 mg pen not 1.0

## 2020-07-15 NOTE — Telephone Encounter (Signed)
[name] [name] with [facility] called to get clarification on mg for pt's ozempic. She states it was written for 1 mg but dosage is for the .25 mg pen.    [phone] Judson Roch

## 2020-07-22 DIAGNOSIS — K582 Mixed irritable bowel syndrome: Secondary | ICD-10-CM | POA: Diagnosis not present

## 2020-07-22 DIAGNOSIS — K3184 Gastroparesis: Secondary | ICD-10-CM | POA: Diagnosis not present

## 2020-07-22 DIAGNOSIS — K5730 Diverticulosis of large intestine without perforation or abscess without bleeding: Secondary | ICD-10-CM | POA: Diagnosis not present

## 2020-07-23 DIAGNOSIS — R197 Diarrhea, unspecified: Secondary | ICD-10-CM | POA: Diagnosis not present

## 2020-07-23 DIAGNOSIS — K3184 Gastroparesis: Secondary | ICD-10-CM | POA: Diagnosis not present

## 2020-07-23 DIAGNOSIS — R109 Unspecified abdominal pain: Secondary | ICD-10-CM | POA: Diagnosis not present

## 2020-07-23 DIAGNOSIS — Z09 Encounter for follow-up examination after completed treatment for conditions other than malignant neoplasm: Secondary | ICD-10-CM | POA: Diagnosis not present

## 2020-07-23 DIAGNOSIS — E1165 Type 2 diabetes mellitus with hyperglycemia: Secondary | ICD-10-CM | POA: Diagnosis not present

## 2020-07-23 DIAGNOSIS — M549 Dorsalgia, unspecified: Secondary | ICD-10-CM | POA: Diagnosis not present

## 2020-08-01 ENCOUNTER — Ambulatory Visit: Payer: Medicare Other

## 2020-08-01 DIAGNOSIS — R519 Headache, unspecified: Secondary | ICD-10-CM | POA: Diagnosis not present

## 2020-08-01 DIAGNOSIS — J019 Acute sinusitis, unspecified: Secondary | ICD-10-CM | POA: Diagnosis not present

## 2020-08-07 ENCOUNTER — Encounter: Payer: Self-pay | Admitting: Psychiatry

## 2020-08-07 ENCOUNTER — Telehealth (INDEPENDENT_AMBULATORY_CARE_PROVIDER_SITE_OTHER): Payer: Medicare Other | Admitting: Psychiatry

## 2020-08-07 DIAGNOSIS — F5101 Primary insomnia: Secondary | ICD-10-CM

## 2020-08-07 DIAGNOSIS — G2581 Restless legs syndrome: Secondary | ICD-10-CM | POA: Diagnosis not present

## 2020-08-07 DIAGNOSIS — F319 Bipolar disorder, unspecified: Secondary | ICD-10-CM

## 2020-08-07 MED ORDER — DIVALPROEX SODIUM 250 MG PO DR TAB: 500.0000 mg | DELAYED_RELEASE_TABLET | Freq: Every day | ORAL | 0 refills | Status: DC

## 2020-08-07 MED ORDER — ROPINIROLE HCL 0.5 MG PO TABS: ORAL_TABLET | ORAL | 0 refills | Status: DC

## 2020-08-07 MED ORDER — LITHIUM CARBONATE ER 450 MG PO TBCR: 450.0000 mg | EXTENDED_RELEASE_TABLET | Freq: Every day | ORAL | 0 refills | Status: DC

## 2020-08-07 MED ORDER — QUETIAPINE FUMARATE 200 MG PO TABS: ORAL_TABLET | ORAL | 0 refills | Status: DC

## 2020-08-07 MED ORDER — DONEPEZIL HCL 10 MG PO TABS: 10.0000 mg | ORAL_TABLET | Freq: Every day | ORAL | 0 refills | Status: DC

## 2020-08-07 MED ORDER — MEMANTINE HCL 10 MG PO TABS: ORAL_TABLET | ORAL | 0 refills | Status: DC
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
## 2020-08-09 ENCOUNTER — Other Ambulatory Visit: Payer: Self-pay

## 2020-08-09 ENCOUNTER — Other Ambulatory Visit (INDEPENDENT_AMBULATORY_CARE_PROVIDER_SITE_OTHER): Payer: Medicare Other

## 2020-08-09 DIAGNOSIS — E1165 Type 2 diabetes mellitus with hyperglycemia: Secondary | ICD-10-CM | POA: Diagnosis not present

## 2020-08-09 DIAGNOSIS — E782 Mixed hyperlipidemia: Secondary | ICD-10-CM | POA: Diagnosis not present

## 2020-08-09 DIAGNOSIS — Z794 Long term (current) use of insulin: Secondary | ICD-10-CM | POA: Diagnosis not present

## 2020-08-10 LAB — FRUCTOSAMINE: Fructosamine: 241 umol/L (ref 0–285)

## 2020-08-12 DIAGNOSIS — M545 Low back pain, unspecified: Secondary | ICD-10-CM | POA: Diagnosis not present

## 2020-08-12 LAB — COMPREHENSIVE METABOLIC PANEL
ALT: 16 U/L (ref 0–35)
AST: 25 U/L (ref 0–37)
Albumin: 4.1 g/dL (ref 3.5–5.2)
Alkaline Phosphatase: 87 U/L (ref 39–117)
BUN: 12 mg/dL (ref 6–23)
CO2: 25 mEq/L (ref 19–32)
Calcium: 9.9 mg/dL (ref 8.4–10.5)
Chloride: 105 mEq/L (ref 96–112)
Creatinine, Ser: 0.81 mg/dL (ref 0.40–1.20)
GFR: 72.45 mL/min (ref >60.00)
Glucose, Bld: 167 mg/dL — ABNORMAL HIGH (ref 70–99)
Potassium: 4.8 mEq/L (ref 3.5–5.1)
Sodium: 139 mEq/L (ref 135–145)
Total Bilirubin: 0.4 mg/dL (ref 0.2–1.2)
Total Protein: 6.7 g/dL (ref 6.0–8.3)

## 2020-08-12 LAB — LIPID PANEL
Cholesterol: 172 mg/dL (ref 0–200)
HDL: 56.9 mg/dL (ref >39.00)
LDL Cholesterol: 79 mg/dL (ref 0–99)
NonHDL: 115.41
Total CHOL/HDL Ratio: 3
Triglycerides: 180 mg/dL — ABNORMAL HIGH (ref 0.0–149.0)
VLDL: 36 mg/dL (ref 0.0–40.0)

## 2020-08-12 LAB — MICROALBUMIN / CREATININE URINE RATIO
Creatinine,U: 62.3 mg/dL
Microalb Creat Ratio: 1.1 mg/g (ref 0.0–30.0)
Microalb, Ur: <0.7 mg/dL (ref 0.0–1.9)

## 2020-08-12 NOTE — Progress Notes (Signed)
Patient ID: [name] [name], female   DOB: [birth year], 75 y.o.   MRN: [id number]            Reason for Appointment: Follow-up for Type 2 Diabetes   History of Present Illness:          Date of diagnosis of type 2 diabetes mellitus: ?  2014        Background history:   She thinks her blood sugar was 400 at the time of diagnosis but no detailed records of this are available She did have an A1c of 10.6 done in 2014 and was probably given Lantus for some time initially Apparently she was treated with various medications including metformin, Janumet and Tradgenta. Her blood sugars had improved and A1c in 2015 was down to 6.2 She tends to have diarrhea with metformin and Janumet and also she thinks it causes dry mouth Most likely Janumet was stopped in 06/2015 Glipizide was started in 8/17 when blood sugars were higher and A1c was 9%  Recent history:   INSULIN regimen is:   Lantus 64 units daily at 5 pm     Non-insulin hypoglycemic drugs the patient is taking are: 0.5 mg Ozempic weekly  Her A1c is last 6.8 previously was at 6.1 Fructosamine is 241  Current management, blood sugar patterns and problems identified:  She is here for short-term follow-up since blood sugars are somewhat higher when she stopped Ozempic  However even though she says she is taking 0.5 mg Ozempic her blood sugars are significantly high especially overnight  She has also gained about 5 pounds.  She thinks she needs to be on a stronger medication to lose weight  She thinks her blood sugars are higher when she is eating some sweets  Lantus was increased by another 4 units on her last visit  She is not taking Humalog even though this has been prescribed previously  Does not exercise because of knee pain Currently not using CGM for monitoring  Side effects from medications have been:?  Diarrhea from metformin and Janumet  Glucose monitoring:  done 1 time a day or less        Glucometer: Contour         Blood Glucose readings    PRE-MEAL Fasting Lunch Dinner Bedtime Overall  Glucose range:  111-190    237   Mean/median:  168   154  158   POST-MEAL PC Breakfast PC Lunch PC Dinner  Glucose range:     Mean/median:    147   Previously:  PRE-MEAL Fasting Lunch Dinner Bedtime Overall  Glucose range:  108-179     108-186  Mean/median:  140    139   POST-MEAL PC Breakfast PC Lunch PC Dinner  Glucose range:    153, 186  Mean/median:        Self-care: The diet that the patient has been following [id number], tries to limit drinks with sugar .      Typical meal intake: Breakfast at 9,  May be eggs and sausage and lunch is usually a sandwich   Dinner 7 pm               Dietician visit, most recent: 10/18                Weight history:   Wt Readings from Last 3 Encounters:  08/13/20 189 lb 6 oz (85.9 kg)  07/04/20 184 lb 9.6 oz (83.7 kg)  06/20/20 184 lb 8 oz (83.7  kg)    Glycemic control:   Lab Results  Component Value Date   HGBA1C 6.8 (A) 06/20/2020   HGBA1C 6.1 12/04/2019   HGBA1C 6.1 08/28/2019   Lab Results  Component Value Date   MICROALBUR <0.7 08/09/2020   LDLCALC 79 08/09/2020   CREATININE 0.81 08/09/2020   Lab Results  Component Value Date   MICRALBCREAT 1.1 08/09/2020       Allergies as of 08/13/2020      Reactions   Codeine Anxiety, Other (See Comments)   Hallucinations, tolerates oxycodone    Procaine Hcl Palpitations   Aspirin Nausea And Vomiting, Other (See Comments)   Reaction:  Burns pts stomach    Benadryl [diphenhydramine] Other (See Comments)   Per MD "inhibits potency of gabapentin, lithium etc"   Diflucan [fluconazole] Other (See Comments)   Unknown reaction   Dilaudid [hydromorphone Hcl] Other (See Comments)   Migraines and nightmares    Other Nausea And Vomiting, Other (See Comments)   Pt states that all -mycins cause N/V. (MACROLIDES)    Tramadol Hcl    Other reaction(s): felt weird   Sulfa Antibiotics Other (See Comments)    Unknown reaction      Medication List       Accurate as of August 13, 2020  9:10 PM. If you have any questions, ask your nurse or doctor.        STOP taking these medications   Ozempic (0.25 or 0.5 MG/DOSE) 2 MG/1.5ML Sopn Generic drug: Semaglutide(0.25 or 0.5MG /DOS) Replaced by: Ozempic (1 MG/DOSE) 2 MG/1.5ML Sopn Stopped by: Elayne Snare, MD     TAKE these medications   acetaminophen 500 MG tablet Commonly known as: TYLENOL Take 1,000 mg by mouth 3 (three) times daily as needed for moderate pain.   ALPRAZolam 0.5 MG tablet Commonly known as: XANAX Take 0.5 mg by mouth 4 (four) times daily as needed for anxiety.   atorvastatin 40 MG tablet Commonly known as: LIPITOR Take 40 mg by mouth every evening.   b complex vitamins capsule Take 1 capsule by mouth daily.   BD Pen Needle Nano 2nd Gen 32G X 4 MM Misc Generic drug: Insulin Pen Needle USE TO INJECT 5 TIMES DAILY AS DIRECTED   celecoxib 200 MG capsule Commonly known as: CELEBREX Take 200 mg by mouth at bedtime.   Contour Next Test test strip Generic drug: glucose blood USE TO TEST BLOOD SUGAR TWICE DAILY   divalproex 250 MG DR tablet Commonly known as: DEPAKOTE Take 2 tablets (500 mg total) by mouth at bedtime.   donepezil 10 MG tablet Commonly known as: ARICEPT Take 1 tablet (10 mg total) by mouth at bedtime.   gabapentin 600 MG tablet Commonly known as: NEURONTIN Take 600 mg by mouth 3 (three) times daily.   Lantus SoloStar 100 UNIT/ML Solostar Pen Generic drug: insulin glargine Inject 64 Units into the skin at bedtime.   levothyroxine 75 MCG tablet Commonly known as: SYNTHROID TAKE 1 TABLET BY MOUTH EVERY DAY BEFORE BREAKFAST What changed: See the new instructions.   lithium carbonate 450 MG CR tablet Commonly known as: ESKALITH Take 1 tablet (450 mg total) by mouth at bedtime.   loperamide 2 MG tablet Commonly known as: IMODIUM A-D Take 2 mg by mouth 2 (two) times daily as needed for diarrhea or  loose stools.   losartan 25 MG tablet Commonly known as: COZAAR Take 25 mg by mouth daily.   Melatonin 10 MG Tbcr Take 10 mg by mouth at  bedtime.   memantine 10 MG tablet Commonly known as: NAMENDA TAKE 1 TABLET(10 MG) BY MOUTH EVERY EVENING   metoCLOPramide 5 MG tablet Commonly known as: Reglan Take 1 tablet (5 mg total) by mouth 3 (three) times daily before meals.   Microlet Lancets Misc TEST DAILY AS DIRECTED   nitroGLYCERIN 0.4 MG SL tablet Commonly known as: NITROSTAT Place 1 tablet (0.4 mg total) under the tongue every 5 (five) minutes as needed for chest pain.   ondansetron 4 MG tablet Commonly known as: ZOFRAN Take 4 mg by mouth every 8 (eight) hours as needed for nausea or vomiting.   Ozempic (1 MG/DOSE) 2 MG/1.5ML Sopn Generic drug: Semaglutide (1 MG/DOSE) Inject 1 mg into the skin once a week. Replaces: Ozempic (0.25 or 0.5 MG/DOSE) 2 MG/1.5ML Sopn Started by: Elayne Snare, MD   QUEtiapine 200 MG tablet Commonly known as: SEROQUEL TAKE 1 TAB PO q 5 pm and 1 tab po QHS   rOPINIRole 0.5 MG tablet Commonly known as: REQUIP Takes 4 tabs at 5 pm and 3 tabs at bedtime and 1-2 tabs prn   Vitamin D (Ergocalciferol) 1.25 MG (50000 UNIT) Caps capsule Commonly known as: DRISDOL Take 50,000 Units by mouth once a week.       Allergies:  Allergies  Allergen Reactions  . Codeine Anxiety and Other (See Comments)    Hallucinations, tolerates oxycodone   . Procaine Hcl Palpitations  . Aspirin Nausea And Vomiting and Other (See Comments)    Reaction:  Burns pts stomach   . Benadryl [Diphenhydramine] Other (See Comments)    Per MD "inhibits potency of gabapentin, lithium etc"  . Diflucan [Fluconazole] Other (See Comments)    Unknown reaction   . Dilaudid [Hydromorphone Hcl] Other (See Comments)    Migraines and nightmares   . Other Nausea And Vomiting and Other (See Comments)    Pt states that all -mycins cause N/V. (MACROLIDES)   . Tramadol Hcl     Other  reaction(s): felt weird  . Sulfa Antibiotics Other (See Comments)    Unknown reaction    Past Medical History:  Diagnosis Date  . Alcohol abuse   . Anxiety    takes Valium daily as needed and Ativan daily  . Bilateral hearing loss   . Bipolar 1 disorder ([place])    takes Lithium nightly and Synthroid daily  . Chronic back pain    DDD; "all over" (09/14/2017)  . Colitis, ischemic ([place]) 2012  . Confusion    r/t meds  . Depression    takes Prozac daily and Bupspirone   . Diverticulosis   . Dyslipidemia    takes Crestor daily  . Fibromyalgia   . Gastroparesis   . Headache    "weekly" (09/14/2017)  . Hepatic steatosis 06/18/12   severe  . Hyperlipidemia   . Hypertension   . Hypothyroidism   . IBS (irritable bowel syndrome)   . Ischemic colitis ([place])   . Joint pain   . Joint swelling   . Lupus erythematosus tumidus    tumid-skin  . Migraine    "1-2/yr; maybe" (09/14/2017)  . Numbness    in right foot  . Osteoarthritis    "all over" (09/14/2017)  . Osteoarthritis cervical spine   . Osteoarthritis of hand    bilateral  . Pneumonia    "walking pneumonia several times; long time since the last time" (09/14/2017)  . Restless leg syndrome    takes Requip nightly  . Sciatica   .  Type II diabetes mellitus ([place])   . Urinary frequency   . Urinary leakage   . Urinary urgency   . Urinary, incontinence, stress female   . Walking pneumonia    last time more than 88yrs ago    Past Surgical History:  Procedure Laterality Date  . ABDOMINAL HYSTERECTOMY     "they left my ovaries"  . APPENDECTOMY    . BALLOON DILATION N/A 06/14/2020   Procedure: BALLOON DILATION;  Surgeon: Ronnette Juniper, MD;  Location: [facility];  Service: Gastroenterology;  Laterality: N/A;  . BIOPSY  06/14/2020   Procedure: BIOPSY;  Surgeon: Ronnette Juniper, MD;  Location: [facility];  Service: Gastroenterology;;  . CARDIAC CATHETERIZATION  09/14/2017  . COLONOSCOPY    . DENTAL SURGERY Left 10/2016   dental implant  .  DILATION AND CURETTAGE OF UTERUS  X 4  . ESOPHAGOGASTRODUODENOSCOPY    . ESOPHAGOGASTRODUODENOSCOPY (EGD) WITH PROPOFOL N/A 06/14/2020   Procedure: ESOPHAGOGASTRODUODENOSCOPY (EGD) WITH PROPOFOL;  Surgeon: Ronnette Juniper, MD;  Location: [facility];  Service: Gastroenterology;  Laterality: N/A;  . FLEXIBLE SIGMOIDOSCOPY N/A 06/21/2012   Procedure: FLEXIBLE SIGMOIDOSCOPY;  Surgeon: Jerene Bears, MD;  Location: [facility];  Service: Gastroenterology;  Laterality: N/A;  . JOINT REPLACEMENT    . LEFT HEART CATH AND CORONARY ANGIOGRAPHY N/A 09/14/2017   Procedure: LEFT HEART CATH AND CORONARY ANGIOGRAPHY;  Surgeon: Belva Crome, MD;  Location: [facility];  Service: Cardiovascular;  Laterality: N/A;  . SHOULDER ARTHROSCOPY Right    "shaved spurs off rotator cuff"  . TONSILLECTOMY    . TOTAL HIP ARTHROPLASTY Right 06/09/2013   Procedure: TOTAL HIP ARTHROPLASTY;  Surgeon: Kerin Salen, MD;  Location: [facility];  Service: Orthopedics;  Laterality: Right;  . TOTAL KNEE ARTHROPLASTY Left 02/07/2019   Procedure: TOTAL KNEE ARTHROPLASTY;  Surgeon: Paralee Cancel, MD;  Location: [facility];  Service: Orthopedics;  Laterality: Left;  70 mins  . TUBAL LIGATION    . TUMOR EXCISION Right 1968   angle of jaw; benign    Family History  Problem Relation Age of Onset  . Drug abuse Mother   . Alcohol abuse Mother   . Alcohol abuse Father   . Hypertension Father   . CAD Brother   . Hypertension Brother   . Alcohol abuse Brother   . Hypertension Brother   . Hypertension Brother   . Psoriasis Daughter   . Arthritis Daughter        psoriatic arthritis   . Alcohol abuse Grandchild     Social History:  reports that she has never smoked. She has never used smokeless tobacco. She reports previous alcohol use. She reports previous drug use. Frequency: 7.00 times per week. Drug: Benzodiazepines.   Review of Systems    Lipid history: On Lipitor 40 mg  LDL has increased She has tried to take her Lipitor more  recently on a regular basis  Triglycerides last over 300 and now improved  Taking OTC fish oil    Lab Results  Component Value Date   CHOL 172 08/09/2020   HDL 56.90 08/09/2020   LDLCALC 79 08/09/2020   LDLDIRECT 137.0 12/04/2019   TRIG 180.0 (H) 08/09/2020   CHOLHDL 3 08/09/2020            Most recent eye exam was In 03/2018, negative  Most recent foot exam: 6/21  THYROID: She has been on levothyroxine and Cytomel from her psychiatrist for several years with uncertain diagnosis She is taking levothyroxine  75 g, with Cytomel 25 g Liothyronine is not on her medication list now  Also on lithium  Her TSH is consistently normal Free T3 and free T4 are normal  Labs as follows:  Lab Results  Component Value Date   TSH 1.37 08/28/2019   TSH 1.02 04/26/2019   TSH 1.35 07/04/2018   FREET4 0.90 08/28/2019   FREET4 1.01 04/26/2019   FREET4 0.77 07/04/2018   Lab Results  Component Value Date   T3FREE 3.2 08/28/2019   T3FREE 2.7 04/26/2019   T3FREE 2.5 07/04/2018   T3FREE 2.3 12/21/2017   T3FREE 3.0 03/22/2017    She has atypical depression on multiple drugs, has been on Seroquel for quite some time  THYROID nodule:  She was found to have a nodule in her thyroid isthmus in 8/20 Ultrasound done in 1/21 showed multinodular goiter with only 1 significant nodule   However nodule in the isthmus was biopsied on the recommendation of her PCP in 3/22 and that showed benign follicular nodule  Previously had mild hypercalcemia, last levels:  Lab Results  Component Value Date   PTH 35 10/25/2018   CALCIUM 9.9 08/09/2020   CAION 1.30 07/14/2016   PHOS 2.9 12/26/2010    ABDOMINAL pain she says she has periodic abdominal pain and is being treated by gastroenterologist with likely hyoscyamine and PPI drugs Reportedly has gastroparesis on endoscopy but no nuclear study done She was told not to take Reglan by her GI that was recommended by myself even though she has  gastroparesis because of fear of side effects  Physical Examination:  BP 138/84   Pulse 86   Ht 5\' 2"  (1.575 m)   Wt 189 lb 6 oz (85.9 kg)   SpO2 97%   BMI 34.64 kg/m      ASSESSMENT:  Diabetes type 2, on insulin  See history of present illness for detailed discussion of current diabetes management, blood sugar patterns, monitor download and problems identified  She is on only Lantus insulin with Ozempic  Her A1c is last 6.8, previously 6.1 Fructosamine is however relatively good at 241  She has been on Ozempic without any increase in her GI symptoms However she still has relatively high fasting readings and some high postprandial readings based on her diet She has gained weight likely  from inconsistent diet Intolerant to Jardiance because of candidiasis  Probable secondary hypothyroidism: Needs labs on the next visit  History of hypercholesterolemia: She has better control with more regular administration of her Lipitor  PLAN:  Retry Ozempic at 1 mg doses She will call if she has any increased abdominal pain but in the meantime can take Reglan as needed for nausea especially with her main meal She will also discuss meal planning with the dietitian at upcoming visits Discussed that she is not a candidate for weight loss medications as these are not covered May however slowly can be titrated up to 2 mg Ozempic if tolerated Discussed that the new dual GIP and GLP drug may not be available on Medicare as yet  A1c in 2 months Continue Lipitor Patient Instructions  Ozempic 1mg  weekly, 2 shots of 0.5mg  till gone  If having nausea or bloating take metclopramide before meals 3x daily         Elayne Snare 08/13/2020, 9:10 PM   Note: This office note was prepared with Dragon voice recognition system technology. Any transcriptional errors that result from this process are unintentional.

## 2020-08-13 ENCOUNTER — Other Ambulatory Visit: Payer: Self-pay

## 2020-08-13 ENCOUNTER — Ambulatory Visit (INDEPENDENT_AMBULATORY_CARE_PROVIDER_SITE_OTHER): Payer: Medicare Other | Admitting: Endocrinology

## 2020-08-13 ENCOUNTER — Encounter: Payer: Self-pay | Admitting: Endocrinology

## 2020-08-13 VITALS — BP 138/84 | HR 86 | Ht 62.0 in | Wt 189.4 lb

## 2020-08-13 DIAGNOSIS — E1165 Type 2 diabetes mellitus with hyperglycemia: Secondary | ICD-10-CM

## 2020-08-13 DIAGNOSIS — E039 Hypothyroidism, unspecified: Secondary | ICD-10-CM

## 2020-08-13 DIAGNOSIS — Z794 Long term (current) use of insulin: Secondary | ICD-10-CM | POA: Diagnosis not present

## 2020-08-13 DIAGNOSIS — E782 Mixed hyperlipidemia: Secondary | ICD-10-CM

## 2020-08-13 MED ORDER — LANTUS SOLOSTAR 100 UNIT/ML ~~LOC~~ SOPN: 64.0000 [IU] | PEN_INJECTOR | Freq: Every day | SUBCUTANEOUS | 2 refills | Status: DC

## 2020-08-13 MED ORDER — OZEMPIC (1 MG/DOSE) 2 MG/1.5ML ~~LOC~~ SOPN: 1.0000 mg | PEN_INJECTOR | SUBCUTANEOUS | 2 refills | Status: DC

## 2020-08-13 NOTE — Patient Instructions (Addendum)
Ozempic 1mg  weekly, 2 shots of 0.5mg  till gone  If having nausea or bloating take metclopramide before meals 3x daily

## 2020-08-16 ENCOUNTER — Encounter: Payer: Medicare Other | Attending: Family Medicine | Admitting: Dietician

## 2020-08-16 ENCOUNTER — Other Ambulatory Visit: Payer: Self-pay

## 2020-08-16 DIAGNOSIS — K3184 Gastroparesis: Secondary | ICD-10-CM | POA: Diagnosis not present

## 2020-08-16 DIAGNOSIS — E669 Obesity, unspecified: Secondary | ICD-10-CM | POA: Diagnosis not present

## 2020-08-16 DIAGNOSIS — E1169 Type 2 diabetes mellitus with other specified complication: Secondary | ICD-10-CM | POA: Insufficient documentation

## 2020-08-16 NOTE — Progress Notes (Signed)
Diabetes Self-Management Education  Visit Type: First/Initial  Appt. Start Time: 1320 Appt. End Time:  0076  08/16/2020  [name] [name], identified by name and date of birth, is a 75 y.o. female with a diagnosis of Diabetes: Type 2.   ASSESSMENT Patient is here today with her husband.  Patient of Dr. Dwyane Dee.  She states that she would like to lose weight.  She verbalized that her medication, particularly with Seroquel, can make this difficult.  They also discussed confusion about nutrition related to gastroparesis (which should be low in fiber) and other issues that state that a higher fiber diet is best.  History includes Type 2 Diabetes, HTN, and HLD, gastroparesis, bipolar 1, history of alcohol abuse, IBS, lupus (no recent flare), fibromyalgia Labs:  A1C 6.8% 06/20/2020, Fructosamine 241, eGFR 72 08/09/20 Medications include:  Lantus 64 units at 5 pm daily, Ozempic to increase from 0.5 o 1 mg Sleep poor:  4-5 hours per night with occasional naps.    Patient lives with her husband.  Her husband does the shopping and cooking.  Patient enjoys baking.  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 Knee replacement in 2020 and difficulty walking now. She eats a lot at night when she cannot sleep.  Height $Remov'5\' 2"'[id number]$  (1.575 m), weight 186 lb (84.4 kg). Body mass index is 34.02 kg/m.   Diabetes Self-Management Education - 08/16/20 1345       Visit Information   Visit Type First/Initial      Initial Visit   Diabetes Type Type 2    Are you currently following a meal plan? No    Are you taking your medications as prescribed? Yes      Health Coping   How would you rate your overall health? Fair      Psychosocial Assessment   Patient Belief/Attitude about Diabetes Afraid    Self-care barriers None    Self-management support Doctor's office    Other persons present Patient;Spouse/SO    Patient  Concerns Nutrition/Meal planning;Weight Control    Special Needs None    Preferred Learning Style No preference indicated    Learning Readiness Ready    How often do you need to have someone help you when you read instructions, pamphlets, or other written materials from your doctor or pharmacy? 1 - Never    What is the last grade level you completed in school? 1 year college      Pre-Education Assessment   Patient understands the diabetes disease and treatment process. Needs Review    Patient understands incorporating nutritional management into lifestyle. Needs Review    Patient undertands incorporating physical activity into lifestyle. Needs Review    Patient understands using medications safely. Needs Review    Patient understands monitoring blood glucose, interpreting and using results Needs Review    Patient understands prevention, detection, and treatment of acute complications. Needs Review    Patient understands prevention, detection, and treatment of chronic complications. Needs Review    Patient understands how to develop strategies to address psychosocial issues. Needs Review    Patient understands how to develop strategies to promote health/change behavior. Needs Review      Complications   Last HgB A1C per patient/outside source 6.8 %   06/20/2020   How often do you check your blood sugar? 1-2 times/day    Fasting Blood glucose range (mg/dL) 130-179;180-200;70-129  111-190   Postprandial Blood glucose range (mg/dL) >200;180-200    Number of hypoglycemic episodes per month 0    Number of hyperglycemic episodes per week 5    Can you tell when your blood sugar is high? No    Have you had a dilated eye exam in the past 12 months? Yes    Have you had a dental exam in the past 12 months? Yes    Are you checking your feet? No      Dietary Intake   Breakfast 1 scrambled egg, 1 slice white toast with butter    Snack (morning) occasional nutrigrain bar    Lunch 1 chicken fajita  (pita delite) (usually out to eat with friends - brings some home)    Snack (afternoon) popcorn, sliver of strawberry custard pie    Dinner 2 oz steak, 1/2 cup baked potato with butter    Snack (evening) chips, dip, salsa, popcorn, leftovers, ice cream, desserts   snacks at night because she cannot sleep, occasional will fall asleep with food in her mouth   Beverage(s) half and half tea, small amounts water, flavored water      Exercise   Exercise Type ADL's      Patient Education   Previous Diabetes Education Yes (please comment)   2014 with Bev and 2018 with myself   Nutrition management  Meal options for control of blood glucose level and chronic complications.;Information on hints to eating out and maintain blood glucose control.;Other (comment)   diet and gastroparesis   Medications Reviewed patients medication for diabetes, action, purpose, timing of dose and side effects.    Chronic complications --   gastroparesis causes   Psychosocial adjustment Identified and addressed patients feelings and concerns about diabetes      Individualized Goals (developed by patient)   Nutrition General guidelines for healthy choices and portions discussed    Medications take my medication as prescribed    Monitoring  test my blood glucose as discussed    Problem Solving balanced meals    Reducing Risk examine blood glucose patterns;Other (comment)   low fat     Post-Education Assessment   Patient understands the diabetes disease and treatment process. Demonstrates understanding / competency    Patient understands incorporating nutritional management into lifestyle. Needs Review    Patient undertands incorporating physical activity into lifestyle. Demonstrates understanding / competency    Patient understands using medications safely. Demonstrates understanding / competency    Patient understands monitoring blood glucose, interpreting and using results Demonstrates understanding / competency     Patient understands prevention, detection, and treatment of acute complications. Demonstrates understanding / competency    Patient understands prevention, detection, and treatment of chronic complications. Demonstrates understanding / competency    Patient understands how to develop strategies to address psychosocial issues. Needs Review    Patient understands how to develop strategies to promote health/change behavior. Needs Review      Outcomes   Expected Outcomes Demonstrated interest in learning. Expect positive outcomes    Future DMSE 2 months    Program Status Not Completed             Individualized Plan for Diabetes Self-Management Training:   Learning Objective:  Patient will have a greater understanding of diabetes self-management. Patient education plan is to attend individual and/or group sessions per assessed needs and concerns.   Plan:   Patient Instructions  Rethink what you drink  Drink decaf unsweetened tea (sweeten with stevia as  desired).  Caffeine can make it harder to sleep and should be avoided due to your stomach issues.   Avoid beverages with sugar/carbohydrates. Avoid snacking after dinner. Try to eat only at the table - avoid eating with distraction. Continue to stop eating when satisfied.  Avoid popcorn as this causes you pain.  Snack options include if you are hungry. Yogurt Cottage cheese and unsweetened canned peaches 1 ounce low fat cheese with 5 crackers 1 Tablespoon peanut butter with 5 crackers Unsweetened, Applesauce, 1 ounce low fat cheese   Expected Outcomes:  Demonstrated interest in learning. Expect positive outcomes  Education material provided: Meal plan card, gastroparesis nutrition therapy  If problems or questions, patient to contact team via:  Phone  Future DSME appointment: 2 months

## 2020-08-16 NOTE — Patient Instructions (Addendum)
Rethink what you drink  Drink decaf unsweetened tea (sweeten with stevia as desired).  Caffeine can make it harder to sleep and should be avoided due to your stomach issues.   Avoid beverages with sugar/carbohydrates. Avoid snacking after dinner. Try to eat only at the table - avoid eating with distraction. Continue to stop eating when satisfied.  Avoid popcorn as this causes you pain.  Snack options include if you are hungry. Yogurt Cottage cheese and unsweetened canned peaches 1 ounce low fat cheese with 5 crackers 1 Tablespoon peanut butter with 5 crackers Unsweetened, Applesauce, 1 ounce low fat cheese

## 2020-08-21 ENCOUNTER — Telehealth: Payer: Self-pay | Admitting: Psychiatry

## 2020-08-21 NOTE — Telephone Encounter (Signed)
Pt called and said that she is in a manic stage right now and whenever this happens [place] tweaks her medicine. Please give her a call at [phone]

## 2020-08-21 NOTE — Telephone Encounter (Signed)
Informed pt [name] [name] is out of the office until [facility] understands and said she will be ok until then.

## 2020-08-23 ENCOUNTER — Emergency Department (HOSPITAL_BASED_OUTPATIENT_CLINIC_OR_DEPARTMENT_OTHER): Payer: Medicare Other

## 2020-08-23 ENCOUNTER — Emergency Department (HOSPITAL_BASED_OUTPATIENT_CLINIC_OR_DEPARTMENT_OTHER)
Admission: EM | Admit: 2020-08-23 | Discharge: 2020-08-23 | Disposition: A | Discharge status: Home / Self Care | Payer: Medicare Other | Attending: Emergency Medicine | Admitting: Emergency Medicine

## 2020-08-23 ENCOUNTER — Encounter (HOSPITAL_BASED_OUTPATIENT_CLINIC_OR_DEPARTMENT_OTHER): Payer: Self-pay | Admitting: Emergency Medicine

## 2020-08-23 ENCOUNTER — Other Ambulatory Visit: Payer: Self-pay

## 2020-08-23 DIAGNOSIS — R1030 Lower abdominal pain, unspecified: Secondary | ICD-10-CM

## 2020-08-23 DIAGNOSIS — E1142 Type 2 diabetes mellitus with diabetic polyneuropathy: Secondary | ICD-10-CM | POA: Diagnosis not present

## 2020-08-23 DIAGNOSIS — I10 Essential (primary) hypertension: Secondary | ICD-10-CM | POA: Diagnosis not present

## 2020-08-23 DIAGNOSIS — M5459 Other low back pain: Secondary | ICD-10-CM | POA: Diagnosis not present

## 2020-08-23 DIAGNOSIS — K5730 Diverticulosis of large intestine without perforation or abscess without bleeding: Secondary | ICD-10-CM | POA: Diagnosis not present

## 2020-08-23 DIAGNOSIS — Z96652 Presence of left artificial knee joint: Secondary | ICD-10-CM | POA: Insufficient documentation

## 2020-08-23 DIAGNOSIS — Z794 Long term (current) use of insulin: Secondary | ICD-10-CM | POA: Diagnosis not present

## 2020-08-23 DIAGNOSIS — M545 Low back pain, unspecified: Secondary | ICD-10-CM | POA: Insufficient documentation

## 2020-08-23 DIAGNOSIS — Z79899 Other long term (current) drug therapy: Secondary | ICD-10-CM | POA: Insufficient documentation

## 2020-08-23 DIAGNOSIS — K5792 Diverticulitis of intestine, part unspecified, without perforation or abscess without bleeding: Secondary | ICD-10-CM | POA: Diagnosis not present

## 2020-08-23 DIAGNOSIS — E039 Hypothyroidism, unspecified: Secondary | ICD-10-CM | POA: Insufficient documentation

## 2020-08-23 DIAGNOSIS — M549 Dorsalgia, unspecified: Secondary | ICD-10-CM | POA: Diagnosis present

## 2020-08-23 DIAGNOSIS — Z96641 Presence of right artificial hip joint: Secondary | ICD-10-CM | POA: Diagnosis not present

## 2020-08-23 LAB — CBC WITH DIFFERENTIAL/PLATELET
Abs Immature Granulocytes: 0.05 10*3/uL (ref 0.00–0.07)
Basophils Absolute: 0 10*3/uL (ref 0.0–0.1)
Basophils Relative: 0 %
Eosinophils Absolute: 0.3 10*3/uL (ref 0.0–0.5)
Eosinophils Relative: 3 %
HCT: 43.8 % (ref 36.0–46.0)
Hemoglobin: 13.7 g/dL (ref 12.0–15.0)
Immature Granulocytes: 1 %
Lymphocytes Relative: 21 %
Lymphs Abs: 2 10*3/uL (ref 0.7–4.0)
MCH: 28.9 pg (ref 26.0–34.0)
MCHC: 31.3 g/dL (ref 30.0–36.0)
MCV: 92.4 fL (ref 80.0–100.0)
Monocytes Absolute: 0.5 10*3/uL (ref 0.1–1.0)
Monocytes Relative: 5 %
Neutro Abs: 6.7 10*3/uL (ref 1.7–7.7)
Neutrophils Relative %: 70 %
Platelets: 215 10*3/uL (ref 150–400)
RBC: 4.74 MIL/uL (ref 3.87–5.11)
RDW: 13.5 % (ref 11.5–15.5)
WBC: 9.6 10*3/uL (ref 4.0–10.5)
nRBC: 0 % (ref 0.0–0.2)

## 2020-08-23 LAB — COMPREHENSIVE METABOLIC PANEL
ALT: 15 U/L (ref 0–44)
AST: 14 U/L — ABNORMAL LOW (ref 15–41)
Albumin: 4.2 g/dL (ref 3.5–5.0)
Alkaline Phosphatase: 82 U/L (ref 38–126)
Anion gap: 8 (ref 5–15)
BUN: 11 mg/dL (ref 8–23)
CO2: 25 mmol/L (ref 22–32)
Calcium: 10 mg/dL (ref 8.9–10.3)
Chloride: 106 mmol/L (ref 98–111)
Creatinine, Ser: 0.86 mg/dL (ref 0.44–1.00)
GFR, Estimated: >60 mL/min (ref >60)
Glucose, Bld: 127 mg/dL — ABNORMAL HIGH (ref 70–99)
Potassium: 4.3 mmol/L (ref 3.5–5.1)
Sodium: 139 mmol/L (ref 135–145)
Total Bilirubin: 0.4 mg/dL (ref 0.3–1.2)
Total Protein: 6.6 g/dL (ref 6.5–8.1)

## 2020-08-23 LAB — URINALYSIS, ROUTINE W REFLEX MICROSCOPIC
Bilirubin Urine: NEGATIVE
Glucose, UA: NEGATIVE mg/dL
Hgb urine dipstick: NEGATIVE
Ketones, ur: NEGATIVE mg/dL
Leukocytes,Ua: NEGATIVE
Nitrite: NEGATIVE
Protein, ur: NEGATIVE mg/dL
Specific Gravity, Urine: 1.01 (ref 1.005–1.030)
pH: 6.5 (ref 5.0–8.0)

## 2020-08-23 LAB — LIPASE, BLOOD: Lipase: 46 U/L (ref 11–51)

## 2020-08-23 MED ORDER — LIDOCAINE 5 % EX PTCH: 1.0000 | MEDICATED_PATCH | CUTANEOUS | 0 refills | Status: DC

## 2020-08-23 MED ORDER — FENTANYL CITRATE (PF) 100 MCG/2ML IJ SOLN
100.0000 ug | Freq: Once | INTRAMUSCULAR | Status: AC
  Administered 2020-08-23: 100 ug via INTRAVENOUS
  Filled 2020-08-23: qty 2

## 2020-08-23 MED ORDER — CYCLOBENZAPRINE HCL 10 MG PO TABS: 10.0000 mg | ORAL_TABLET | Freq: Two times a day (BID) | ORAL | 0 refills | Status: DC | PRN

## 2020-08-23 MED ORDER — IOHEXOL 300 MG/ML  SOLN
75.0000 mL | Freq: Once | INTRAMUSCULAR | Status: AC | PRN
  Administered 2020-08-23: 75 mL via INTRAVENOUS

## 2020-08-23 MED ORDER — SODIUM CHLORIDE 0.9 % IV BOLUS
500.0000 mL | Freq: Once | INTRAVENOUS | Status: AC
  Administered 2020-08-23: 500 mL via INTRAVENOUS

## 2020-08-23 MED ORDER — OXYCODONE HCL 5 MG PO TABS: 5.0000 mg | ORAL_TABLET | ORAL | 0 refills | Status: DC | PRN

## 2020-08-23 MED ORDER — AMOXICILLIN-POT CLAVULANATE 875-125 MG PO TABS: 1.0000 | ORAL_TABLET | Freq: Two times a day (BID) | ORAL | 0 refills | Status: AC

## 2020-08-23 MED ORDER — DEXAMETHASONE SODIUM PHOSPHATE 10 MG/ML IJ SOLN
10.0000 mg | Freq: Once | INTRAMUSCULAR | Status: AC
  Administered 2020-08-23: 10 mg via INTRAVENOUS
  Filled 2020-08-23: qty 1

## 2020-08-23 NOTE — ED Notes (Signed)
Attempted iv insertion right ac,unsuccessful.

## 2020-08-23 NOTE — ED Provider Notes (Signed)
[facility] EMERGENCY DEPT Provider Note   CSN: [id number] Arrival date & time: 08/23/20  1429     History Chief Complaint  Patient presents with   Back Pain    [name] [name] is a 75 y.o. female.  HPI     75 year old female with a history of bipolar, hypertension, hyperlipidemia, hypothyroidism, diabetes, osteoarthritis, presents with back pain and abdominal pain.  Abdominal pain began about one week ago. Lower abdominal pain, cramping bilateral lower abdomen. Has history of diverticulitis and colitis.  7/10 pain Back pain started yesterday, lower back, feels like a sharp stabbing pain. Tried tylenol and robaxin but did not help. She had been gardening prior to it starting but can't remember exact incident.  No injury, no loss of control bowel bladder/numbness/weakness.   Past Medical History:  Diagnosis Date   Alcohol abuse    Anxiety    takes Valium daily as needed and Ativan daily   Bilateral hearing loss    Bipolar 1 disorder ([place])    takes Lithium nightly and Synthroid daily   Chronic back pain    DDD; "all over" (09/14/2017)   Colitis, ischemic ([place]) 2012   Confusion    r/t meds   Depression    takes Prozac daily and Bupspirone    Diverticulosis    Dyslipidemia    takes Crestor daily   Fibromyalgia    Gastroparesis    Headache    "weekly" (09/14/2017)   Hepatic steatosis 06/18/12   severe   Hyperlipidemia    Hypertension    Hypothyroidism    IBS (irritable bowel syndrome)    Ischemic colitis ([place])    Joint pain    Joint swelling    Lupus erythematosus tumidus    tumid-skin   Migraine    "1-2/yr; maybe" (09/14/2017)   Numbness    in right foot   Osteoarthritis    "all over" (09/14/2017)   Osteoarthritis cervical spine    Osteoarthritis of hand    bilateral   Pneumonia    "walking pneumonia several times; long time since the last time" (09/14/2017)   Restless leg syndrome    takes Requip nightly   Sciatica    Type II diabetes mellitus  ([name])    Urinary frequency    Urinary leakage    Urinary urgency    Urinary, incontinence, stress female    Walking pneumonia    last time more than 78yrs ago    Patient Active Problem List   Diagnosis Date Noted   Intractable abdominal pain 07/10/2020   Pancreatic abnormality 07/10/2020   Diabetes mellitus type 2 in obese ([place]) 07/10/2020   Essential hypertension 07/10/2020   Bipolar 1 disorder, mixed, moderate ([place])    Suicidal ideations    Overweight (BMI 25.0-29.9) 02/08/2019   S/P left TKA 02/07/2019   Chest pain    Chest pain with normal coronary angiography    Diverticulitis 11/19/2016   Hypothyroidism 07/14/2016   Diarrhea    Generalized abdominal pain    Major depressive disorder, recurrent severe without psychotic features ([place]) 12/31/2015   Nausea without vomiting    Colitis 10/28/2015   Unintentional poisoning by psychotropic drug 07/05/2015   Restless leg syndrome 07/05/2015   Diabetic polyneuropathy associated with type 2 diabetes mellitus ([place]) 09/21/2014   Arthritis of left hip 06/09/2013   Arthritis of right hip 06/08/2013   Hip pain, right 05/02/2013   IBS (irritable bowel syndrome) 01/02/2013   Unspecified vitamin D deficiency 07/05/2012   Pure  hyperglyceridemia 07/04/2012   Diabetes mellitus with diabetic neuropathy, without long-term current use of insulin ([place]) 06/18/2012   Hypertension 06/18/2012   Bipolar 1 disorder ([place]) 06/18/2012   Low back pain 02/25/2011   Acute ischemic colitis ([place]) 01/06/2011   Sciatica 12/19/2010   Fibromyalgia 10/28/2010   Primary osteoarthritis of right hip 10/24/2010   Hyperlipidemia with target low density lipoprotein (LDL) cholesterol less than 100 mg/dL 05/05/2010   DEPRESSION/ANXIETY 05/05/2010   ABUSE, ALCOHOL, IN REMISSION 05/05/2010   OTITIS MEDIA, CHRONIC 05/05/2010   Hearing loss 05/05/2010   ALLERGIC RHINITIS, SEASONAL, MILD 05/05/2010   URINARY INCONTINENCE, STRESS, FEMALE 05/05/2010   OSTEOARTHRITIS,  HANDS, BILATERAL 05/05/2010   OSTEOARTHRITIS, CERVICAL SPINE 05/05/2010    Past Surgical History:  Procedure Laterality Date   ABDOMINAL HYSTERECTOMY     "they left my ovaries"   APPENDECTOMY     BALLOON DILATION N/A 06/14/2020   Procedure: BALLOON DILATION;  Surgeon: Ronnette Juniper, MD;  Location: [facility];  Service: Gastroenterology;  Laterality: N/A;   BIOPSY  06/14/2020   Procedure: BIOPSY;  Surgeon: Ronnette Juniper, MD;  Location: [facility];  Service: Gastroenterology;;   CARDIAC CATHETERIZATION  09/14/2017   COLONOSCOPY     DENTAL SURGERY Left 10/2016   dental implant   DILATION AND CURETTAGE OF UTERUS  X 4   ESOPHAGOGASTRODUODENOSCOPY     ESOPHAGOGASTRODUODENOSCOPY (EGD) WITH PROPOFOL N/A 06/14/2020   Procedure: ESOPHAGOGASTRODUODENOSCOPY (EGD) WITH PROPOFOL;  Surgeon: Ronnette Juniper, MD;  Location: [facility];  Service: Gastroenterology;  Laterality: N/A;   FLEXIBLE SIGMOIDOSCOPY N/A 06/21/2012   Procedure: FLEXIBLE SIGMOIDOSCOPY;  Surgeon: Jerene Bears, MD;  Location: [facility];  Service: Gastroenterology;  Laterality: N/A;   JOINT REPLACEMENT     LEFT HEART CATH AND CORONARY ANGIOGRAPHY N/A 09/14/2017   Procedure: LEFT HEART CATH AND CORONARY ANGIOGRAPHY;  Surgeon: Belva Crome, MD;  Location: [facility];  Service: Cardiovascular;  Laterality: N/A;   SHOULDER ARTHROSCOPY Right    "shaved spurs off rotator cuff"   TONSILLECTOMY     TOTAL HIP ARTHROPLASTY Right 06/09/2013   Procedure: TOTAL HIP ARTHROPLASTY;  Surgeon: Kerin Salen, MD;  Location: [facility];  Service: Orthopedics;  Laterality: Right;   TOTAL KNEE ARTHROPLASTY Left 02/07/2019   Procedure: TOTAL KNEE ARTHROPLASTY;  Surgeon: Paralee Cancel, MD;  Location: [facility];  Service: Orthopedics;  Laterality: Left;  70 mins   TUBAL LIGATION     TUMOR EXCISION Right 1968   angle of jaw; benign     OB History   No obstetric history on file.     Family History  Problem Relation Age of Onset   Drug abuse Mother     Alcohol abuse Mother    Alcohol abuse Father    Hypertension Father    CAD Brother    Hypertension Brother    Alcohol abuse Brother    Hypertension Brother    Hypertension Brother    Psoriasis Daughter    Arthritis Daughter        psoriatic arthritis    Alcohol abuse Grandchild     Social History   Tobacco Use   Smoking status: Never   Smokeless tobacco: Never  Vaping Use   Vaping Use: Never used  Substance Use Topics   Alcohol use: Not Currently    Comment: 09/14/2017 "nothing since 04/15/2008"   Drug use: Not Currently    Frequency: 7.0 times per week    Types: Benzodiazepines    Home Medications Prior to Admission  medications   Medication Sig Start Date End Date Taking? Authorizing Provider  ALPRAZolam Duanne Moron) 0.5 MG tablet Take 0.5 mg by mouth 4 (four) times daily as needed for anxiety. 01/04/20  Yes [provider]  amoxicillin-clavulanate (AUGMENTIN) 875-125 MG tablet Take 1 tablet by mouth every 12 (twelve) hours for 7 days. 08/23/20 08/30/20 Yes Gareth Morgan, MD  atorvastatin (LIPITOR) 40 MG tablet Take 40 mg by mouth every evening.   Yes [provider]  b complex vitamins capsule Take 1 capsule by mouth daily.   Yes [provider]  cholecalciferol (VITAMIN D3) 25 MCG (1000 UNIT) tablet Take 3,000 Units by mouth daily.   Yes [provider]  cyclobenzaprine (FLEXERIL) 10 MG tablet Take 1 tablet (10 mg total) by mouth 2 (two) times daily as needed for muscle spasms. 08/23/20  Yes Gareth Morgan, MD  divalproex (DEPAKOTE) 250 MG DR tablet Take 2 tablets (500 mg total) by mouth at bedtime. 08/07/20  Yes [name], PMHNP  donepezil (ARICEPT) 10 MG tablet Take 1 tablet (10 mg total) by mouth at bedtime. 08/07/20  Yes [name], PMHNP  gabapentin (NEURONTIN) 600 MG tablet Take 600 mg by mouth 3 (three) times daily. 05/12/19  Yes [provider]  insulin glargine (LANTUS SOLOSTAR) 100 UNIT/ML Solostar Pen Inject 64 Units  into the skin at bedtime. 08/13/20  Yes Elayne Snare, MD  levothyroxine (SYNTHROID) 75 MCG tablet TAKE 1 TABLET BY MOUTH EVERY DAY BEFORE BREAKFAST Patient taking differently: Take 75 mcg by mouth daily before breakfast. 02/22/20  Yes Elayne Snare, MD  lidocaine (LIDODERM) 5 % Place 1 patch onto the skin daily. Remove & Discard patch within 12 hours or as directed by MD 08/23/20  Yes Gareth Morgan, MD  lithium carbonate (ESKALITH) 450 MG CR tablet Take 1 tablet (450 mg total) by mouth at bedtime. 08/07/20  Yes [name], PMHNP  loperamide (IMODIUM A-D) 2 MG tablet Take 2 mg by mouth 2 (two) times daily as needed for diarrhea or loose stools.   Yes [provider]  losartan (COZAAR) 25 MG tablet Take 25 mg by mouth daily. 08/31/19  Yes [provider]  Melatonin 10 MG TBCR Take 10 mg by mouth at bedtime.   Yes [provider]  memantine (NAMENDA) 10 MG tablet TAKE 1 TABLET(10 MG) BY MOUTH EVERY EVENING 08/07/20  Yes [name], PMHNP  methocarbamol (ROBAXIN) 500 MG tablet Take 500 mg by mouth every 6 (six) hours as needed for muscle spasms.   Yes [provider]  ondansetron (ZOFRAN) 4 MG tablet Take 4 mg by mouth every 8 (eight) hours as needed for nausea or vomiting. 11/26/19  Yes [provider]  oxyCODONE (ROXICODONE) 5 MG immediate release tablet Take 1 tablet (5 mg total) by mouth every 4 (four) hours as needed for severe pain. 08/23/20  Yes Gareth Morgan, MD  QUEtiapine (SEROQUEL) 200 MG tablet TAKE 1 TAB PO q 5 pm and 1 tab po QHS 08/07/20  Yes [name], PMHNP  rOPINIRole (REQUIP) 0.5 MG tablet Takes 4 tabs at 5 pm and 3 tabs at bedtime and 1-2 tabs prn 08/07/20  Yes [name], PMHNP  Semaglutide, 1 MG/DOSE, (OZEMPIC, 1 MG/DOSE,) 2 MG/1.5ML SOPN Inject 1 mg into the skin once a week. 08/13/20  Yes Elayne Snare, MD  acetaminophen (TYLENOL) 500 MG tablet Take 1,000 mg by mouth 3 (three) times daily as needed for moderate pain.     [provider]  BD PEN NEEDLE  NANO 2ND GEN 32G X 4 MM MISC USE TO INJECT 5 TIMES DAILY AS DIRECTED 10/17/19   Elayne Snare, MD  glucose blood (CONTOUR NEXT TEST) test strip USE TO TEST BLOOD SUGAR TWICE DAILY 06/20/20   Elayne Snare, MD  Microlet Lancets MISC TEST DAILY AS DIRECTED 06/20/20   Elayne Snare, MD  nitroGLYCERIN (NITROSTAT) 0.4 MG SL tablet Place 1 tablet (0.4 mg total) under the tongue every 5 (five) minutes as needed for chest pain. Patient not taking: No sig reported 09/14/17   Lyda Jester M, PA-C  Vitamin D, Ergocalciferol, (DRISDOL) 1.25 MG (50000 UNIT) CAPS capsule Take 50,000 Units by mouth once a week. Patient not taking: No sig reported 04/19/20   [provider]    Allergies    Codeine, Procaine hcl, Aspirin, Benadryl [diphenhydramine], Diflucan [fluconazole], Dilaudid [hydromorphone hcl], Other, Tramadol hcl, and Sulfa antibiotics  Review of Systems   Review of Systems  Constitutional:  Negative for fever.  Respiratory:  Negative for cough.   Gastrointestinal:  Positive for abdominal pain. Negative for nausea and vomiting.  Genitourinary:  Negative for difficulty urinating and dysuria.  Musculoskeletal:  Positive for back pain.  Skin:  Negative for rash.  Neurological:  Negative for weakness, numbness and headaches.   Physical Exam Updated Vital Signs BP (!) 152/77   Pulse 71   Temp 98 F (36.7 C) (Oral)   Resp 20   Wt 81.6 kg   SpO2 94%   BMI 32.92 kg/m   Physical Exam Vitals and nursing note reviewed.  Constitutional:      General: She is not in acute distress.    Appearance: She is well-developed. She is not diaphoretic.  HENT:     Head: Normocephalic and atraumatic.  Eyes:     Conjunctiva/sclera: Conjunctivae normal.  Cardiovascular:     Rate and Rhythm: Normal rate and regular rhythm.     Heart sounds: Normal heart sounds. No murmur heard.   No friction rub. No gallop.  Pulmonary:     Effort: Pulmonary effort is normal. No  respiratory distress.     Breath sounds: Normal breath sounds. No wheezing or rales.  Abdominal:     General: There is no distension.     Palpations: Abdomen is soft.     Tenderness: There is abdominal tenderness (LLQ, suprapubic). There is no guarding.  Musculoskeletal:        General: No tenderness.     Cervical back: Normal range of motion.  Skin:    General: Skin is warm and dry.     Findings: No erythema or rash.  Neurological:     Mental Status: She is alert and oriented to person, place, and time.     Comments: Normal strength sensation LE    ED Results / Procedures / Treatments   Labs (all labs ordered are listed, but only abnormal results are displayed) Labs Reviewed  URINE CULTURE - Abnormal; Notable for the following components:      Result Value   Culture MULTIPLE SPECIES PRESENT, SUGGEST RECOLLECTION (*)    All other components within normal limits  COMPREHENSIVE METABOLIC PANEL - Abnormal; Notable for the following components:   Glucose, Bld 127 (*)    AST 14 (*)    All other components within normal limits  URINALYSIS, ROUTINE W REFLEX MICROSCOPIC - Abnormal; Notable for the following components:   Color, Urine COLORLESS (*)    All other components within normal limits  CBC WITH DIFFERENTIAL/PLATELET  LIPASE, BLOOD  EKG None  Radiology No results found.  Procedures Procedures   Medications Ordered in ED Medications  sodium chloride 0.9 % bolus 500 mL (0 mLs Intravenous Stopped 08/23/20 1741)  fentaNYL (SUBLIMAZE) injection 100 mcg (100 mcg Intravenous Given 08/23/20 1645)  iohexol (OMNIPAQUE) 300 MG/ML solution 75 mL (75 mLs Intravenous Contrast Given 08/23/20 1812)  dexamethasone (DECADRON) injection 10 mg (10 mg Intravenous Given 08/23/20 1906)    ED Course  I have reviewed the triage vital signs and the nursing notes.  Pertinent labs & imaging results that were available during my care of the patient were reviewed by me and considered in my  medical decision making (see chart for details).    MDM Rules/Calculators/A&P                           75 year old female with a history of bipolar, hypertension, hyperlipidemia, hypothyroidism, diabetes, osteoarthritis, presents with back pain and abdominal pain. No sign of pancreatitis, hepatitis, pyelonephritis on labs.  No signs of cauda equina, epidural abscess, or surgical spine emergency.  CT abdomen pelvis does show diverticulitis. Given symptoms present for one week feel antibiotics are appropriate. Given rx for augmentin. Suspect other msk etiology of pian, radicular in nature, will give single dose of decadron.  Reviewed in [facility] drug database and gave rx for oxycodone, recommend flexeril/lidocaine and other OTC pain control. Patient discharged in stable condition with understanding of reasons to return.    Final Clinical Impression(s) / ED Diagnoses Final diagnoses:  Acute low back pain without sciatica, unspecified back pain laterality  Lower abdominal pain  Diverticulitis    Rx / DC Orders ED Discharge Orders          Ordered    oxyCODONE (ROXICODONE) 5 MG immediate release tablet  Every 4 hours PRN        08/23/20 1857    cyclobenzaprine (FLEXERIL) 10 MG tablet  2 times daily PRN        08/23/20 1857    lidocaine (LIDODERM) 5 %  Every 24 hours        08/23/20 1857    amoxicillin-clavulanate (AUGMENTIN) 875-125 MG tablet  Every 12 hours        08/23/20 1859             Gareth Morgan, MD 08/26/20 1054

## 2020-08-23 NOTE — ED Triage Notes (Signed)
Back pain mid, feels like a knife in her back. Pt had been tending her garden bending over and started hurting. Started yesterday.tylenol and robaxin was taken and did not help

## 2020-08-25 LAB — URINE CULTURE

## 2020-08-26 ENCOUNTER — Telehealth: Payer: Self-pay | Admitting: Psychiatry

## 2020-08-26 DIAGNOSIS — F411 Generalized anxiety disorder: Secondary | ICD-10-CM

## 2020-08-26 DIAGNOSIS — F5101 Primary insomnia: Secondary | ICD-10-CM

## 2020-08-26 DIAGNOSIS — F319 Bipolar disorder, unspecified: Secondary | ICD-10-CM

## 2020-08-26 MED ORDER — OLANZAPINE 2.5 MG PO TABS: 2.5000 mg | ORAL_TABLET | Freq: Every day | ORAL | 0 refills | Status: DC

## 2020-08-26 NOTE — Telephone Encounter (Signed)
Pt called  and said she called last week while Janett Billow was on vacation. She is maniac and needs her meds adjusted. Please call her at [phone]

## 2020-08-26 NOTE — Telephone Encounter (Signed)
Returned call to pt. She reports that she has been having manic s/s. She reports that she has been "hyper, not being able to stop." She reports that she has been shopping excessively and unable to relax. She reports that she has not been sleeping well, maybe 4-5 hours a night. She has been sleeping in the recliner.   She reports that she has made 11 pies and cut up multiple gallons of strawberries. Denies any risky behavior to include ETOH and gambling. Denies SI.   She reports that Klonopin was not effective. Xanax has also been ineffective.   Will start Olanzapine 2.5 mg po QHS for manic s/s since this has been effective for her s/s in the past.   Provider will f/u in 2 days and will place on cancellation list.   Patient advised to contact office with any questions, adverse effects, or acute worsening in signs and symptoms.

## 2020-08-26 NOTE — Telephone Encounter (Signed)
Please refer to other encounter

## 2020-08-27 DIAGNOSIS — M533 Sacrococcygeal disorders, not elsewhere classified: Secondary | ICD-10-CM | POA: Diagnosis not present

## 2020-08-27 DIAGNOSIS — M545 Low back pain, unspecified: Secondary | ICD-10-CM | POA: Diagnosis not present

## 2020-08-28 ENCOUNTER — Telehealth: Payer: Self-pay

## 2020-08-28 NOTE — Telephone Encounter (Signed)
Prior Approval received for OLANZAPINE 2.5 MG TABS effective 08/27/2020-08/27/2024 with [employer], Member ID# [id number]

## 2020-08-29 ENCOUNTER — Other Ambulatory Visit: Payer: Self-pay

## 2020-08-29 ENCOUNTER — Encounter: Payer: Self-pay | Admitting: Psychiatry

## 2020-08-29 ENCOUNTER — Ambulatory Visit (INDEPENDENT_AMBULATORY_CARE_PROVIDER_SITE_OTHER): Payer: Medicare Other | Admitting: Psychiatry

## 2020-08-29 DIAGNOSIS — F411 Generalized anxiety disorder: Secondary | ICD-10-CM

## 2020-08-29 DIAGNOSIS — F5101 Primary insomnia: Secondary | ICD-10-CM

## 2020-08-29 DIAGNOSIS — F319 Bipolar disorder, unspecified: Secondary | ICD-10-CM

## 2020-08-29 MED ORDER — ALPRAZOLAM 0.5 MG PO TABS: 0.5000 mg | ORAL_TABLET | Freq: Four times a day (QID) | ORAL | 0 refills | Status: DC | PRN

## 2020-08-29 MED ORDER — OLANZAPINE 2.5 MG PO TABS: 5.0000 mg | ORAL_TABLET | Freq: Every day | ORAL | 0 refills | Status: DC

## 2020-08-29 NOTE — Progress Notes (Signed)
[name] [name] [id number] [birth year] 75 y.o.  Subjective:   Patient ID:  [name] [name] is a 76 y.o. (DOB [birth year]) female.  Chief Complaint:  Chief Complaint  Patient presents with   Manic Behavior     HPI [name] [name] presents to the office today for follow-up of mood disorder with recent manic signs and symptoms. She reports that she started Olanzapine for the last 2 nights- "it's making me come down some." She reports that she slept some the night before last. She reports that she did not sleep last night and felt compelled to make pies around midnight. She went [place] to a strawberry farm this [place] to get more strawberries despite already having strawberries at home and in her freezer. Reports that she has capped 11 gallons of strawberries and baking multiple pies. She continues to have increased goal directed activity and elevated mood. She reports that she has felt tired at times. She reports that she has been eating impulsively. She reports that she has been shopping excessively and staying busy. Denies gambling or ETOH use. She reports that she has some periods of anxiety- "I think it is because I can't stop." She reports racing thoughts. Denies SI.   Past Medication Trials: Tegretol-adverse reaction Lamictal-itching, swollen lymph nodes Trileptal-ineffective Depakote- has taken long-term with some benefit Gabapentin-prescribed for RLS, pain, and anxiety.  Unable to tolerate doses above 400 mg 3 times daily Topamax Gabitril Lyrica Keppra Zonegran Sonata-intermittently effective Xanax-effective Ativan Valium Klonopin-patient reports feeling "peculiar" Lithium-has taken long-term with some improvement.  Unable to tolerate doses greater than 450 mg Seroquel- helpful for racing thoughts and mood signs and symptoms.  Daytime somnolence with higher doses.  Has caused RLS without Requip. Geodon-tolerability  issues Rexulti-EPS Latuda-EPS Vraylar-EPS Saphris-excessive sedation and severe RLS Abilify-may have exacerbated gambling Risperdal Olanzapine- RLS, increased appetite, wt gain Perphenazine Loxapine- severe adverse effects at low dose. Had weakness, twitches BuSpar-ineffective Prozac-effective and then stopped working Zoloft-could not tolerate Lexapro-has used short-term to alleviate depressive signs and symptoms Remeron Wellbutrin Trazodone-nightmares Doxepin- ineffective Remeron Vistaril-ineffective Requip- takes due to restless legs secondary to Seroquel.  Has taken long-term and reports being on higher doses in the past.  Denies correlation between Requip and gambling. Pramipexole NAC Deplin Buprenorphine Namenda Verapamil Pindolol Isradapine Clonidine Lunesta-ineffective Belsomra- ineffective Dayvigo- Legs "buckled up." Ambien Ambien CR-in effective  AIMS    Flowsheet Row Office Visit from 06/17/2020 in [facility] Visit from 04/23/2020 in [facility] Visit from 10/09/2019 in [facility] Total Score 0 0 0      [place] Office Visit from 04/23/2020 in [facility] ED from 03/28/2020 in [facility]PT  Total GAD-7 Score 11 20      PHQ2-9    [place] [facility] from 03/28/2020 in [facility] Nutrition from 04/30/2016 in [facility]  PHQ-2 Total Score 0 6 6  PHQ-9 Total Score -- 26 [place] ED from 08/23/2020 in [facility] ED to [place] (Discharged) from 07/10/2020 in [facility] Admission (Discharged) from 06/14/2020 in [facility] No Risk No Risk No Risk        Review of Systems:  Review of  Systems  GI-Diverticulitis. Back pain. Reports glucose has been elevated.  Medications: I have reviewed the patient's current medications.  Current Outpatient Medications  Medication Sig Dispense Refill   ALPRAZolam (XANAX) 0.5 MG tablet Take 1 tablet (0.5 mg total) by mouth 4 (four) times daily as needed for anxiety. 120 tablet 0   oxyCODONE (ROXICODONE) 5 MG immediate release tablet Take 1 tablet (5 mg total) by mouth every 4 (four) hours as needed for severe pain. 10 tablet 0   acetaminophen (TYLENOL) 500 MG tablet Take 1,000 mg by mouth 3 (three) times daily as needed for moderate pain.      atorvastatin (LIPITOR) 40 MG tablet Take 40 mg by mouth every evening.  0   b complex vitamins capsule Take 1 capsule by mouth daily.     BD PEN NEEDLE NANO 2ND GEN 32G X 4 MM MISC USE TO INJECT 5 TIMES DAILY AS DIRECTED 300 each 0   cholecalciferol (VITAMIN D3) 25 MCG (1000 UNIT) tablet Take 3,000 Units by mouth daily.     cyclobenzaprine (FLEXERIL) 10 MG tablet Take 1 tablet (10 mg total) by mouth 2 (two) times daily as needed for muscle spasms. 20 tablet 0   divalproex (DEPAKOTE) 250 MG DR tablet Take 2 tablets (500 mg total) by mouth at bedtime. 180 tablet 0   donepezil (ARICEPT) 10 MG tablet Take 1 tablet (10 mg total) by mouth at bedtime. 90 tablet 0   gabapentin (NEURONTIN) 600 MG tablet Take 600 mg by mouth 3 (three) times daily.     glucose blood (CONTOUR NEXT TEST) test strip USE TO TEST BLOOD SUGAR TWICE DAILY 100 strip 3   insulin glargine (LANTUS SOLOSTAR) 100 UNIT/ML Solostar Pen Inject 64 Units into the skin at bedtime. 15 mL 2   levothyroxine (SYNTHROID) 75 MCG tablet TAKE 1 TABLET BY MOUTH EVERY DAY BEFORE BREAKFAST (Patient taking differently: Take 75 mcg by mouth daily before breakfast.) 90 tablet 1   lidocaine (LIDODERM) 5 % Place 1 patch onto the skin daily. Remove & Discard patch within 12 hours or as directed by MD 30 patch 0   lithium carbonate (ESKALITH) 450 MG CR tablet Take  1 tablet (450 mg total) by mouth at bedtime. 90 tablet 0   loperamide (IMODIUM A-D) 2 MG tablet Take 2 mg by mouth 2 (two) times daily as needed for diarrhea or loose stools.     losartan (COZAAR) 25 MG tablet Take 25 mg by mouth daily.     Melatonin 10 MG TBCR Take 10 mg by mouth at bedtime.     memantine (NAMENDA) 10 MG tablet TAKE 1 TABLET(10 MG) BY MOUTH EVERY EVENING 90 tablet 0   methocarbamol (ROBAXIN) 500 MG tablet Take 500 mg by mouth every 6 (six) hours as needed for muscle spasms.     Microlet Lancets MISC TEST DAILY AS DIRECTED 100 each 3   nitroGLYCERIN (NITROSTAT) 0.4 MG SL tablet Place 1 tablet (0.4 mg total) under the tongue every 5 (five) minutes as needed for chest pain. (Patient not taking: No sig reported) 25 tablet 2   OLANZapine (ZYPREXA) 2.5 MG tablet Take 2 tablets (5 mg total) by mouth at bedtime. 30 tablet 0   ondansetron (ZOFRAN) 4 MG tablet Take 4 mg by mouth every 8 (eight) hours as needed for nausea or vomiting.     QUEtiapine (SEROQUEL) 200 MG tablet TAKE 1 TAB PO q 5 pm and 1 tab po QHS 180 tablet 0   rOPINIRole (REQUIP) 0.5 MG tablet Takes 4 tabs at 5 pm and 3  tabs at bedtime and 1-2 tabs prn 720 tablet 0   Semaglutide, 1 MG/DOSE, (OZEMPIC, 1 MG/DOSE,) 2 MG/1.5ML SOPN Inject 1 mg into the skin once a week. 3 mL 2   Vitamin D, Ergocalciferol, (DRISDOL) 1.25 MG (50000 UNIT) CAPS capsule Take 50,000 Units by mouth once a week. (Patient not taking: No sig reported)     No current facility-administered medications for this visit.    Medication Side Effects: None  Allergies:  Allergies  Allergen Reactions   Codeine Anxiety and Other (See Comments)    Hallucinations, tolerates oxycodone    Procaine Hcl Palpitations   Aspirin Nausea And Vomiting and Other (See Comments)    Reaction:  Burns pts stomach    Benadryl [Diphenhydramine] Other (See Comments)    Per MD "inhibits potency of gabapentin, lithium etc"   Diflucan [Fluconazole] Other (See Comments)     Unknown reaction    Dilaudid [Hydromorphone Hcl] Other (See Comments)    Migraines and nightmares    Other Nausea And Vomiting and Other (See Comments)    Pt states that all -mycins cause N/V. (MACROLIDES)    Tramadol Hcl     Other reaction(s): felt weird   Sulfa Antibiotics Other (See Comments)    Unknown reaction    Past Medical History:  Diagnosis Date   Alcohol abuse    Anxiety    takes Valium daily as needed and Ativan daily   Bilateral hearing loss    Bipolar 1 disorder ([place])    takes Lithium nightly and Synthroid daily   Chronic back pain    DDD; "all over" (09/14/2017)   Colitis, ischemic ([place]) 2012   Confusion    r/t meds   Depression    takes Prozac daily and Bupspirone    Diverticulosis    Dyslipidemia    takes Crestor daily   Fibromyalgia    Gastroparesis    Headache    "weekly" (09/14/2017)   Hepatic steatosis 06/18/12   severe   Hyperlipidemia    Hypertension    Hypothyroidism    IBS (irritable bowel syndrome)    Ischemic colitis ([place])    Joint pain    Joint swelling    Lupus erythematosus tumidus    tumid-skin   Migraine    "1-2/yr; maybe" (09/14/2017)   Numbness    in right foot   Osteoarthritis    "all over" (09/14/2017)   Osteoarthritis cervical spine    Osteoarthritis of hand    bilateral   Pneumonia    "walking pneumonia several times; long time since the last time" (09/14/2017)   Restless leg syndrome    takes Requip nightly   Sciatica    Type II diabetes mellitus ([name])    Urinary frequency    Urinary leakage    Urinary urgency    Urinary, incontinence, stress female    Walking pneumonia    last time more than 5yrs ago    Past Medical History, Surgical history, Social history, and Family history were reviewed and updated as appropriate.   Please see review of systems for further details on the patient's review from today.   Objective:   Physical Exam:  There were no vitals taken for this visit.   Physical Exam Neurological:      Mental Status: She is alert and oriented to person, place, and time.     Cranial Nerves: No dysarthria.  Psychiatric:        Attention and Perception: Attention and perception normal.  Mood and Affect: Mood is manic and affect is expansive.        Speech: Speech normal.        Behavior: Behavior is cooperative.        Thought Content: Thought content normal. Thought content is not paranoid or delusional. Thought content does not include homicidal or suicidal ideation. Thought content does not include homicidal or suicidal plan.        Cognition and Memory: Cognition and memory normal.        Judgment: Judgment normal.     Comments: Insight intact   Physical Exam  Lab Review:     Component Value Date/Time   NA 139 08/23/2020 1556   K 4.3 08/23/2020 1556   CL 106 08/23/2020 1556   CO2 25 08/23/2020 1556   GLUCOSE 127 (H) 08/23/2020 1556   BUN 11 08/23/2020 1556   CREATININE 0.86 08/23/2020 1556   CREATININE 0.85 10/09/2019 1052   CALCIUM 10.0 08/23/2020 1556   PROT 6.6 08/23/2020 1556   ALBUMIN 4.2 08/23/2020 1556   AST 14 (L) 08/23/2020 1556   ALT 15 08/23/2020 1556   ALKPHOS 82 08/23/2020 1556   BILITOT 0.4 08/23/2020 1556   GFRNONAA >60 08/23/2020 1556   GFRNONAA 57 (L) 11/17/2017 0000   GFRAA >60 12/10/2019 0657   GFRAA 67 11/17/2017 0000       Component Value Date/Time   WBC 9.6 08/23/2020 1556   RBC 4.74 08/23/2020 1556   HGB 13.7 08/23/2020 1556   HCT 43.8 08/23/2020 1556   PLT 215 08/23/2020 1556   MCV 92.4 08/23/2020 1556   MCH 28.9 08/23/2020 1556   MCHC 31.3 08/23/2020 1556   RDW 13.5 08/23/2020 1556   LYMPHSABS 2.0 08/23/2020 1556   MONOABS 0.5 08/23/2020 1556   EOSABS 0.3 08/23/2020 1556   BASOSABS 0.0 08/23/2020 1556    Lithium Lvl  Date Value Ref Range Status  07/11/2020 0.24 (L) 0.60 - 1.20 mmol/L Final    Comment:    Performed at [facility], [place] [address], [place], [place] [address]     Lab Results  Component  Value Date   VALPROATE 67.6 04/11/2019     .res Assessment: Plan:    Patient seen for 30 minutes and time spent discussing treatment of acute manic signs and symptoms.  Recommended increasing olanzapine from 2.5 mg at bedtime to 5 mg to further improve manic signs and symptoms since she reports only some mild improvement in manic signs and symptoms at the 2.5 mg dose. Discussed considering Caplyta as a possible treatment option in the future since it has a lower risk of weight gain, metabolic side effects, and tardive dyskinesia compared to many other antipsychotics. Will continue all other medications without changes. Recommend continuing psychotherapy with Beckey Downing, LCAS. Patient to follow-up in 3 weeks or sooner if clinically indicated. Patient advised to contact office with any questions, adverse effects, or acute worsening in signs and symptoms.   [name] [name] was seen today for manic behavior.  Diagnoses and all orders for this visit:  Generalized anxiety disorder -     OLANZapine (ZYPREXA) 2.5 MG tablet; Take 2 tablets (5 mg total) by mouth at bedtime.  Primary insomnia -     OLANZapine (ZYPREXA) 2.5 MG tablet; Take 2 tablets (5 mg total) by mouth at bedtime.  Bipolar 1 disorder ([name]) -     OLANZapine (ZYPREXA) 2.5 MG tablet; Take 2 tablets (5 mg total) by mouth at bedtime.  Please see After Visit Summary for patient specific instructions.  Future Appointments  Date Time Provider [place]  09/19/2020  3:00 PM [name], PMHNP CP-CP None  09/25/2020 10:00 AM [facility] MM 2 GI-BCGMM [facility]  09/25/2020 10:30 AM [facility] DX DEXA 1 GI-BCGDG [facility]  10/08/2020 10:00 AM [facility] LAB [facility] None  10/15/2020 10:00 AM Elayne Snare, MD [facility] None  10/18/2020  1:45 PM Jobe, Barnabas Lister, RD [place] NDM    No orders of the defined types were placed in this encounter.   -------------------------------

## 2020-09-04 ENCOUNTER — Ambulatory Visit: Payer: Medicare Other | Admitting: Psychiatry

## 2020-09-06 ENCOUNTER — Other Ambulatory Visit: Payer: Self-pay

## 2020-09-06 ENCOUNTER — Encounter (HOSPITAL_BASED_OUTPATIENT_CLINIC_OR_DEPARTMENT_OTHER): Payer: Self-pay | Admitting: *Deleted

## 2020-09-06 ENCOUNTER — Emergency Department (HOSPITAL_BASED_OUTPATIENT_CLINIC_OR_DEPARTMENT_OTHER): Payer: Medicare Other

## 2020-09-06 ENCOUNTER — Emergency Department (HOSPITAL_BASED_OUTPATIENT_CLINIC_OR_DEPARTMENT_OTHER)
Admission: EM | Admit: 2020-09-06 | Discharge: 2020-09-07 | Disposition: A | Discharge status: Home / Self Care | Payer: Medicare Other | Attending: Emergency Medicine | Admitting: Emergency Medicine

## 2020-09-06 DIAGNOSIS — Z79899 Other long term (current) drug therapy: Secondary | ICD-10-CM | POA: Diagnosis not present

## 2020-09-06 DIAGNOSIS — R197 Diarrhea, unspecified: Secondary | ICD-10-CM | POA: Diagnosis not present

## 2020-09-06 DIAGNOSIS — Z794 Long term (current) use of insulin: Secondary | ICD-10-CM | POA: Insufficient documentation

## 2020-09-06 DIAGNOSIS — K5792 Diverticulitis of intestine, part unspecified, without perforation or abscess without bleeding: Secondary | ICD-10-CM | POA: Diagnosis not present

## 2020-09-06 DIAGNOSIS — R0602 Shortness of breath: Secondary | ICD-10-CM | POA: Diagnosis not present

## 2020-09-06 DIAGNOSIS — I10 Essential (primary) hypertension: Secondary | ICD-10-CM | POA: Diagnosis not present

## 2020-09-06 DIAGNOSIS — Z96641 Presence of right artificial hip joint: Secondary | ICD-10-CM | POA: Insufficient documentation

## 2020-09-06 DIAGNOSIS — E039 Hypothyroidism, unspecified: Secondary | ICD-10-CM | POA: Insufficient documentation

## 2020-09-06 DIAGNOSIS — E1142 Type 2 diabetes mellitus with diabetic polyneuropathy: Secondary | ICD-10-CM | POA: Insufficient documentation

## 2020-09-06 DIAGNOSIS — R109 Unspecified abdominal pain: Secondary | ICD-10-CM | POA: Diagnosis not present

## 2020-09-06 DIAGNOSIS — Z96652 Presence of left artificial knee joint: Secondary | ICD-10-CM | POA: Insufficient documentation

## 2020-09-06 LAB — CBC
HCT: 44.8 % (ref 36.0–46.0)
Hemoglobin: 14.2 g/dL (ref 12.0–15.0)
MCH: 29 pg (ref 26.0–34.0)
MCHC: 31.7 g/dL (ref 30.0–36.0)
MCV: 91.6 fL (ref 80.0–100.0)
Platelets: 217 K/uL (ref 150–400)
RBC: 4.89 MIL/uL (ref 3.87–5.11)
RDW: 13.6 % (ref 11.5–15.5)
WBC: 10.1 K/uL (ref 4.0–10.5)
nRBC: 0 % (ref 0.0–0.2)

## 2020-09-06 LAB — URINALYSIS, ROUTINE W REFLEX MICROSCOPIC
Bilirubin Urine: NEGATIVE
Glucose, UA: NEGATIVE mg/dL
Hgb urine dipstick: NEGATIVE
Ketones, ur: NEGATIVE mg/dL
Nitrite: NEGATIVE
Protein, ur: NEGATIVE mg/dL
Specific Gravity, Urine: 1.016 (ref 1.005–1.030)
pH: 6.5 (ref 5.0–8.0)

## 2020-09-06 LAB — COMPREHENSIVE METABOLIC PANEL WITH GFR
ALT: 16 U/L (ref 0–44)
AST: 16 U/L (ref 15–41)
Albumin: 4.3 g/dL (ref 3.5–5.0)
Alkaline Phosphatase: 85 U/L (ref 38–126)
Anion gap: 9 (ref 5–15)
BUN: 9 mg/dL (ref 8–23)
CO2: 26 mmol/L (ref 22–32)
Calcium: 10.2 mg/dL (ref 8.9–10.3)
Chloride: 103 mmol/L (ref 98–111)
Creatinine, Ser: 0.91 mg/dL (ref 0.44–1.00)
GFR, Estimated: >60 mL/min
Glucose, Bld: 135 mg/dL — ABNORMAL HIGH (ref 70–99)
Potassium: 3.8 mmol/L (ref 3.5–5.1)
Sodium: 138 mmol/L (ref 135–145)
Total Bilirubin: 0.5 mg/dL (ref 0.3–1.2)
Total Protein: 7.2 g/dL (ref 6.5–8.1)

## 2020-09-06 LAB — LIPASE, BLOOD: Lipase: 35 U/L (ref 11–51)

## 2020-09-06 MED ORDER — AMOXICILLIN-POT CLAVULANATE 875-125 MG PO TABS: 1.0000 | ORAL_TABLET | Freq: Two times a day (BID) | ORAL | 0 refills | Status: DC

## 2020-09-06 MED ORDER — FENTANYL CITRATE (PF) 100 MCG/2ML IJ SOLN
50.0000 ug | Freq: Once | INTRAMUSCULAR | Status: AC
  Administered 2020-09-06: 50 ug via INTRAVENOUS
  Filled 2020-09-06: qty 2

## 2020-09-06 MED ORDER — IOHEXOL 300 MG/ML  SOLN
100.0000 mL | Freq: Once | INTRAMUSCULAR | Status: AC | PRN
  Administered 2020-09-06: 100 mL via INTRAVENOUS

## 2020-09-06 NOTE — ED Notes (Signed)
RT placed pt on [place] 2 LPM for sats of 87% while sleeping. RT will continue to monitor.

## 2020-09-06 NOTE — ED Triage Notes (Signed)
Bilateral lower abdominal pain since 08/23/2020.  She was seen here for back pain and was diagnosed with diverticulitis.  Sent home with antibiotic with no relief.  Pt still c/o abdominal pain with nausea. Intermittent diarrhea and constipation and shortness of breath when taking a few steps.

## 2020-09-06 NOTE — ED Provider Notes (Signed)
[facility] EMERGENCY DEPT Provider Note   CSN: [id number] Arrival date & time: 09/06/20  1725     History Chief Complaint  Patient presents with   Abdominal Pain    [name] [name] is a 75 y.o. female.  Patient complaining of abdominal pain ongoing for about 10 days.  She says she was recently diagnosed with diverticulitis.  She finished a course of antibiotic she states but still has abdominal pain.  Denies fevers or cough no vomiting.  She had 4-5 loose bowel movements today however nonbloody.  Denies chest pain or fevers.      Past Medical History:  Diagnosis Date   Alcohol abuse    Anxiety    takes Valium daily as needed and Ativan daily   Bilateral hearing loss    Bipolar 1 disorder ([place])    takes Lithium nightly and Synthroid daily   Chronic back pain    DDD; "all over" (09/14/2017)   Colitis, ischemic ([place]) 2012   Confusion    r/t meds   Depression    takes Prozac daily and Bupspirone    Diverticulosis    Dyslipidemia    takes Crestor daily   Fibromyalgia    Gastroparesis    Headache    "weekly" (09/14/2017)   Hepatic steatosis 06/18/12   severe   Hyperlipidemia    Hypertension    Hypothyroidism    IBS (irritable bowel syndrome)    Ischemic colitis ([place])    Joint pain    Joint swelling    Lupus erythematosus tumidus    tumid-skin   Migraine    "1-2/yr; maybe" (09/14/2017)   Numbness    in right foot   Osteoarthritis    "all over" (09/14/2017)   Osteoarthritis cervical spine    Osteoarthritis of hand    bilateral   Pneumonia    "walking pneumonia several times; long time since the last time" (09/14/2017)   Restless leg syndrome    takes Requip nightly   Sciatica    Type II diabetes mellitus ([place])    Urinary frequency    Urinary leakage    Urinary urgency    Urinary, incontinence, stress female    Walking pneumonia    last time more than 97yrs ago    Patient Active Problem List   Diagnosis Date Noted   Intractable abdominal pain  07/10/2020   Pancreatic abnormality 07/10/2020   Diabetes mellitus type 2 in obese ([place]) 07/10/2020   Essential hypertension 07/10/2020   Bipolar 1 disorder, mixed, moderate ([place])    Suicidal ideations    Overweight (BMI 25.0-29.9) 02/08/2019   S/P left TKA 02/07/2019   Chest pain    Chest pain with normal coronary angiography    Diverticulitis 11/19/2016   Hypothyroidism 07/14/2016   Diarrhea    Generalized abdominal pain    Major depressive disorder, recurrent severe without psychotic features ([place]) 12/31/2015   Nausea without vomiting    Colitis 10/28/2015   Unintentional poisoning by psychotropic drug 07/05/2015   Restless leg syndrome 07/05/2015   Diabetic polyneuropathy associated with type 2 diabetes mellitus ([place]) 09/21/2014   Arthritis of left hip 06/09/2013   Arthritis of right hip 06/08/2013   Hip pain, right 05/02/2013   IBS (irritable bowel syndrome) 01/02/2013   Unspecified vitamin D deficiency 07/05/2012   Pure hyperglyceridemia 07/04/2012   Diabetes mellitus with diabetic neuropathy, without long-term current use of insulin ([place]) 06/18/2012   Hypertension 06/18/2012   Bipolar 1 disorder ([place]) 06/18/2012   Low  back pain 02/25/2011   Acute ischemic colitis ([place]) 01/06/2011   Sciatica 12/19/2010   Fibromyalgia 10/28/2010   Primary osteoarthritis of right hip 10/24/2010   Hyperlipidemia with target low density lipoprotein (LDL) cholesterol less than 100 mg/dL 05/05/2010   DEPRESSION/ANXIETY 05/05/2010   ABUSE, ALCOHOL, IN REMISSION 05/05/2010   OTITIS MEDIA, CHRONIC 05/05/2010   Hearing loss 05/05/2010   ALLERGIC RHINITIS, SEASONAL, MILD 05/05/2010   URINARY INCONTINENCE, STRESS, FEMALE 05/05/2010   OSTEOARTHRITIS, HANDS, BILATERAL 05/05/2010   OSTEOARTHRITIS, CERVICAL SPINE 05/05/2010    Past Surgical History:  Procedure Laterality Date   ABDOMINAL HYSTERECTOMY     "they left my ovaries"   APPENDECTOMY     BALLOON DILATION N/A 06/14/2020   Procedure:  BALLOON DILATION;  Surgeon: Ronnette Juniper, MD;  Location: [facility];  Service: Gastroenterology;  Laterality: N/A;   BIOPSY  06/14/2020   Procedure: BIOPSY;  Surgeon: Ronnette Juniper, MD;  Location: [facility];  Service: Gastroenterology;;   CARDIAC CATHETERIZATION  09/14/2017   COLONOSCOPY     DENTAL SURGERY Left 10/2016   dental implant   DILATION AND CURETTAGE OF UTERUS  X 4   ESOPHAGOGASTRODUODENOSCOPY     ESOPHAGOGASTRODUODENOSCOPY (EGD) WITH PROPOFOL N/A 06/14/2020   Procedure: ESOPHAGOGASTRODUODENOSCOPY (EGD) WITH PROPOFOL;  Surgeon: Ronnette Juniper, MD;  Location: [facility];  Service: Gastroenterology;  Laterality: N/A;   FLEXIBLE SIGMOIDOSCOPY N/A 06/21/2012   Procedure: FLEXIBLE SIGMOIDOSCOPY;  Surgeon: Jerene Bears, MD;  Location: [facility];  Service: Gastroenterology;  Laterality: N/A;   JOINT REPLACEMENT     LEFT HEART CATH AND CORONARY ANGIOGRAPHY N/A 09/14/2017   Procedure: LEFT HEART CATH AND CORONARY ANGIOGRAPHY;  Surgeon: Belva Crome, MD;  Location: [facility];  Service: Cardiovascular;  Laterality: N/A;   SHOULDER ARTHROSCOPY Right    "shaved spurs off rotator cuff"   TONSILLECTOMY     TOTAL HIP ARTHROPLASTY Right 06/09/2013   Procedure: TOTAL HIP ARTHROPLASTY;  Surgeon: Kerin Salen, MD;  Location: [facility];  Service: Orthopedics;  Laterality: Right;   TOTAL KNEE ARTHROPLASTY Left 02/07/2019   Procedure: TOTAL KNEE ARTHROPLASTY;  Surgeon: Paralee Cancel, MD;  Location: [facility];  Service: Orthopedics;  Laterality: Left;  70 mins   TUBAL LIGATION     TUMOR EXCISION Right 1968   angle of jaw; benign     OB History     Gravida  2   Para  2   Term      Preterm      AB      Living         SAB      IAB      Ectopic      Multiple      Live Births              Family History  Problem Relation Age of Onset   Drug abuse Mother    Alcohol abuse Mother    Alcohol abuse Father    Hypertension Father    CAD Brother    Hypertension Brother     Alcohol abuse Brother    Hypertension Brother    Hypertension Brother    Psoriasis Daughter    Arthritis Daughter        psoriatic arthritis    Alcohol abuse Grandchild     Social History   Tobacco Use   Smoking status: Never   Smokeless tobacco: Never  Vaping Use   Vaping Use: Never used  Substance Use Topics   Alcohol  use: Not Currently    Comment: 09/14/2017 "nothing since 04/15/2008"   Drug use: Not Currently    Frequency: 7.0 times per week    Types: Benzodiazepines    Home Medications Prior to Admission medications   Medication Sig Start Date End Date Taking? Authorizing Provider  ALPRAZolam Duanne Moron) 0.5 MG tablet Take 1 tablet (0.5 mg total) by mouth 4 (four) times daily as needed for anxiety. 08/29/20  Yes [name], PMHNP  amoxicillin-clavulanate (AUGMENTIN) 875-125 MG tablet Take 1 tablet by mouth every 12 (twelve) hours. 09/06/20  Yes Luna Fuse, MD  atorvastatin (LIPITOR) 40 MG tablet Take 40 mg by mouth every evening.   Yes [provider]  b complex vitamins capsule Take 1 capsule by mouth daily.   Yes [provider]  cholecalciferol (VITAMIN D3) 25 MCG (1000 UNIT) tablet Take 3,000 Units by mouth daily.   Yes [provider]  divalproex (DEPAKOTE) 250 MG DR tablet Take 2 tablets (500 mg total) by mouth at bedtime. 08/07/20  Yes [name], PMHNP  donepezil (ARICEPT) 10 MG tablet Take 1 tablet (10 mg total) by mouth at bedtime. 08/07/20  Yes [name], PMHNP  gabapentin (NEURONTIN) 600 MG tablet Take 600 mg by mouth 3 (three) times daily. 05/12/19  Yes [provider]  insulin glargine (LANTUS SOLOSTAR) 100 UNIT/ML Solostar Pen Inject 64 Units into the skin at bedtime. 08/13/20  Yes Elayne Snare, MD  levothyroxine (SYNTHROID) 75 MCG tablet TAKE 1 TABLET BY MOUTH EVERY DAY BEFORE BREAKFAST Patient taking differently: Take 75 mcg by mouth daily before breakfast. 02/22/20  Yes Elayne Snare, MD  lithium carbonate (ESKALITH) 450  MG CR tablet Take 1 tablet (450 mg total) by mouth at bedtime. 08/07/20  Yes [name], PMHNP  loperamide (IMODIUM A-D) 2 MG tablet Take 2 mg by mouth 2 (two) times daily as needed for diarrhea or loose stools.   Yes [provider]  losartan (COZAAR) 25 MG tablet Take 25 mg by mouth daily. 08/31/19  Yes [provider]  Melatonin 10 MG TBCR Take 10 mg by mouth at bedtime.   Yes [provider]  memantine (NAMENDA) 10 MG tablet TAKE 1 TABLET(10 MG) BY MOUTH EVERY EVENING 08/07/20  Yes [name], PMHNP  OLANZapine (ZYPREXA) 2.5 MG tablet Take 2 tablets (5 mg total) by mouth at bedtime. 08/29/20  Yes [name], PMHNP  QUEtiapine (SEROQUEL) 200 MG tablet TAKE 1 TAB PO q 5 pm and 1 tab po QHS 08/07/20  Yes [name], PMHNP  rOPINIRole (REQUIP) 0.5 MG tablet Takes 4 tabs at 5 pm and 3 tabs at bedtime and 1-2 tabs prn 08/07/20  Yes [name], PMHNP  Vitamin D, Ergocalciferol, (DRISDOL) 1.25 MG (50000 UNIT) CAPS capsule Take 50,000 Units by mouth once a week. 04/19/20  Yes [provider]  acetaminophen (TYLENOL) 500 MG tablet Take 1,000 mg by mouth 3 (three) times daily as needed for moderate pain.     [provider]  BD PEN NEEDLE NANO 2ND GEN 32G X 4 MM MISC USE TO INJECT 5 TIMES DAILY AS DIRECTED 10/17/19   Elayne Snare, MD  glucose blood (CONTOUR NEXT TEST) test strip USE TO TEST BLOOD SUGAR TWICE DAILY 06/20/20   Elayne Snare, MD  Microlet Lancets MISC TEST DAILY AS DIRECTED 06/20/20   Elayne Snare, MD  nitroGLYCERIN (NITROSTAT) 0.4 MG SL tablet Place 1 tablet (0.4 mg total) under the tongue every 5 (five) minutes as needed for chest pain. Patient  not taking: No sig reported 09/14/17   Lyda Jester M, PA-C  ondansetron (ZOFRAN) 4 MG tablet Take 4 mg by mouth every 8 (eight) hours as needed for nausea or vomiting. 11/26/19   [provider]  Semaglutide, 1 MG/DOSE, (OZEMPIC, 1 MG/DOSE,) 2 MG/1.5ML SOPN Inject 1 mg into the skin once  a week. 08/13/20   Elayne Snare, MD    Allergies    Codeine, Procaine hcl, Aspirin, Benadryl [diphenhydramine], Diflucan [fluconazole], Dilaudid [hydromorphone hcl], Other, Tramadol hcl, and Sulfa antibiotics  Review of Systems   Review of Systems  Constitutional:  Negative for fever.  HENT:  Negative for ear pain.   Eyes:  Negative for pain.  Respiratory:  Negative for cough.   Cardiovascular:  Negative for chest pain.  Gastrointestinal:  Positive for abdominal pain.  Genitourinary:  Negative for flank pain.  Musculoskeletal:  Negative for back pain.  Skin:  Negative for rash.  Neurological:  Negative for headaches.   Physical Exam Updated Vital Signs BP (!) 131/101   Pulse 80   Temp 97.9 F (36.6 C) (Oral)   Resp 20   Ht 5\' 2"  (1.575 m)   Wt 81.6 kg   SpO2 93%   BMI 32.90 kg/m   Physical Exam Constitutional:      General: She is not in acute distress.    Appearance: Normal appearance.  HENT:     Head: Normocephalic.     Nose: Nose normal.  Eyes:     Extraocular Movements: Extraocular movements intact.  Cardiovascular:     Rate and Rhythm: Normal rate.  Pulmonary:     Effort: Pulmonary effort is normal.  Abdominal:     Tenderness: There is abdominal tenderness.     Comments: Diffuse lower abdominal tenderness is present.  No guarding or rebound.  Musculoskeletal:        General: Normal range of motion.     Cervical back: Normal range of motion.  Neurological:     General: No focal deficit present.     Mental Status: She is alert. Mental status is at baseline.    ED Results / Procedures / Treatments   Labs (all labs ordered are listed, but only abnormal results are displayed) Labs Reviewed  COMPREHENSIVE METABOLIC PANEL - Abnormal; Notable for the following components:      Result Value   Glucose, Bld 135 (*)    All other components within normal limits  URINALYSIS, ROUTINE W REFLEX MICROSCOPIC - Abnormal; Notable for the following components:    Leukocytes,Ua SMALL (*)    All other components within normal limits  LIPASE, BLOOD  CBC    EKG None  Radiology CT Abdomen Pelvis W Contrast  Result Date: 09/06/2020 CLINICAL DATA:  Bilateral lower abdominal pain since 08/23/2020. Recent diagnosis of diverticulitis. No relief with antibiotics. Nausea and diarrhea. EXAM: CT ABDOMEN AND PELVIS WITH CONTRAST TECHNIQUE: Multidetector CT imaging of the abdomen and pelvis was performed using the standard protocol following bolus administration of intravenous contrast. CONTRAST:  144mL OMNIPAQUE IOHEXOL 300 MG/ML  SOLN COMPARISON:  08/23/2020 FINDINGS: Lower chest: Lung bases are clear. Hepatobiliary: Mild diffuse fatty infiltration of the liver with focal areas of geographic fat in the medial left lobe. Gallbladder is contracted. No bile duct dilatation. Pancreas: Unremarkable. No pancreatic ductal dilatation or surrounding inflammatory changes. Spleen: Normal in size without focal abnormality. Adrenals/Urinary Tract: Renal nephrograms are homogeneous and symmetrical. Mild bilateral hydronephrosis without ureterectasis. No obstructing lesions identified. Changes likely due to ureteropelvic junction stenosis.  Bladder is unremarkable. Stomach/Bowel: Stomach, small bowel, and colon are not abnormally distended. Minimally increase of stranding around the sigmoid colon suggesting persistent diverticulitis. No abscess or perforation is identified. Diverticulosis of the sigmoid colon. Appendix is not identified. Vascular/Lymphatic: Aortic atherosclerosis. No enlarged abdominal or pelvic lymph nodes. Reproductive: Status post hysterectomy. No adnexal masses. Other: No abdominal wall hernia or abnormality. No abdominopelvic ascites. Musculoskeletal: Degenerative changes in the spine. No destructive bone lesions. Right hip arthroplasty. IMPRESSION: 1. Diverticulosis of the sigmoid colon with changes of mild diverticulitis. Minimal progression since previous study. No  abscess or perforation. 2. Mild fatty infiltration of the liver. 3. Mild bilateral renal hydronephrosis, likely UPJ obstruction. Electronically Signed   By: Lucienne Capers M.D.   On: 09/06/2020 22:20   DG Chest Port 1 View  Result Date: 09/06/2020 CLINICAL DATA:  Shortness of breath. Intermittent diarrhea and constipation. EXAM: PORTABLE CHEST 1 VIEW COMPARISON:  05/29/2020 FINDINGS: Shallow inspiration. Heart size and pulmonary vascularity are normal. Peribronchial thickening and streaky perihilar opacities likely representing airways disease or bronchitis. No airspace disease or consolidation in the lungs. No pleural effusions. No pneumothorax. Mediastinal contours appear intact. IMPRESSION: Bronchitic changes in the lungs.  No focal consolidation. Electronically Signed   By: Lucienne Capers M.D.   On: 09/06/2020 21:15    Procedures Procedures   Medications Ordered in ED Medications  fentaNYL (SUBLIMAZE) injection 50 mcg (50 mcg Intravenous Given 09/06/20 2220)  iohexol (OMNIPAQUE) 300 MG/ML solution 100 mL (100 mLs Intravenous Contrast Given 09/06/20 2204)    ED Course  I have reviewed the triage vital signs and the nursing notes.  Pertinent labs & imaging results that were available during my care of the patient were reviewed by me and considered in my medical decision making (see chart for details).    MDM Rules/Calculators/A&P                          Labs show normal white count normal hemoglobin chemistry normal.  Urinalysis shows only small leuks.  Lipase normal as well.  CT abdomen pelvis shows diverticulosis with mild diverticulitis.  Patient started to get another antibiotic, given Augmentin.  Addendum: Nursing staff stated her O2 saturation dropped about 88% when she was sleeping.  Placed on 2 L nasal cannula.  Upon my evaluation on room air she sats 93-95% which is appropriate.  Will be discharged home, advised follow-up with her primary care doctor regarding her  diverticulitis and her O2 sat drop within the week.  Patient expressed understanding, discharged home in stable condition.  Final Clinical Impression(s) / ED Diagnoses Final diagnoses:  Diverticulitis    Rx / DC Orders ED Discharge Orders          Ordered    amoxicillin-clavulanate (AUGMENTIN) 875-125 MG tablet  Every 12 hours        09/06/20 2314             Luna Fuse, MD 09/06/20 2314

## 2020-09-06 NOTE — ED Notes (Signed)
RT increased O2 to [place] 4 Lpm w/sats prior to placement back down to high 80's. Pt sats post increase of O2 to 95%. Pts respiratory status is stable w/no distress noted at this time. RT will continue to monitor.

## 2020-09-06 NOTE — Discharge Instructions (Addendum)
Call your primary care doctor or specialist as discussed in the next 2-3 days.   Return immediately back to the ER if:  Your symptoms worsen within the next 12-24 hours. You develop new symptoms such as new fevers, persistent vomiting, new pain, shortness of breath, or new weakness or numbness, or if you have any other concerns.  

## 2020-09-06 NOTE — ED Notes (Signed)
Patient does not have a ride home, drowsy from IV fentanyl. Delay in discharge for safe discharge

## 2020-09-07 NOTE — ED Notes (Signed)
Patient ambulated well prior to discharge. Patient ambulated without difficulty and Oxygen Saturated maintained at 95% on Room Air at Rest and during Ambulation. Patient comfortable at this time. This RN presented the AVS utilizing Teachback Method. Patient verbalizes understanding of Discharge Instructions. Opportunity for Questioning and Answers were provided. Patient Discharged from ED ambulatory to Home.

## 2020-09-07 NOTE — ED Notes (Signed)
Patient provided with PO Intake as requested. Patient comfortable at this time with Call Bell at the Bedside. Staff will continue to monitor patient for disposition.

## 2020-09-10 ENCOUNTER — Other Ambulatory Visit: Payer: Self-pay

## 2020-09-10 ENCOUNTER — Encounter (HOSPITAL_BASED_OUTPATIENT_CLINIC_OR_DEPARTMENT_OTHER): Payer: Self-pay | Admitting: Obstetrics and Gynecology

## 2020-09-10 ENCOUNTER — Emergency Department (HOSPITAL_BASED_OUTPATIENT_CLINIC_OR_DEPARTMENT_OTHER)
Admission: EM | Admit: 2020-09-10 | Discharge: 2020-09-10 | Disposition: A | Discharge status: Home / Self Care | Payer: Medicare Other | Attending: Emergency Medicine | Admitting: Emergency Medicine

## 2020-09-10 DIAGNOSIS — R110 Nausea: Secondary | ICD-10-CM | POA: Insufficient documentation

## 2020-09-10 DIAGNOSIS — R1084 Generalized abdominal pain: Secondary | ICD-10-CM | POA: Insufficient documentation

## 2020-09-10 DIAGNOSIS — Z794 Long term (current) use of insulin: Secondary | ICD-10-CM | POA: Diagnosis not present

## 2020-09-10 DIAGNOSIS — Z96652 Presence of left artificial knee joint: Secondary | ICD-10-CM | POA: Insufficient documentation

## 2020-09-10 DIAGNOSIS — R109 Unspecified abdominal pain: Secondary | ICD-10-CM

## 2020-09-10 DIAGNOSIS — Z79899 Other long term (current) drug therapy: Secondary | ICD-10-CM | POA: Insufficient documentation

## 2020-09-10 DIAGNOSIS — E119 Type 2 diabetes mellitus without complications: Secondary | ICD-10-CM | POA: Insufficient documentation

## 2020-09-10 DIAGNOSIS — Z96641 Presence of right artificial hip joint: Secondary | ICD-10-CM | POA: Diagnosis not present

## 2020-09-10 DIAGNOSIS — Z8719 Personal history of other diseases of the digestive system: Secondary | ICD-10-CM | POA: Insufficient documentation

## 2020-09-10 DIAGNOSIS — E039 Hypothyroidism, unspecified: Secondary | ICD-10-CM | POA: Diagnosis not present

## 2020-09-10 DIAGNOSIS — I10 Essential (primary) hypertension: Secondary | ICD-10-CM | POA: Diagnosis not present

## 2020-09-10 DIAGNOSIS — G8929 Other chronic pain: Secondary | ICD-10-CM | POA: Diagnosis not present

## 2020-09-10 DIAGNOSIS — R197 Diarrhea, unspecified: Secondary | ICD-10-CM | POA: Diagnosis not present

## 2020-09-10 LAB — URINALYSIS, ROUTINE W REFLEX MICROSCOPIC
Bilirubin Urine: NEGATIVE
Glucose, UA: NEGATIVE mg/dL
Hgb urine dipstick: NEGATIVE
Ketones, ur: NEGATIVE mg/dL
Leukocytes,Ua: NEGATIVE
Nitrite: NEGATIVE
Protein, ur: NEGATIVE mg/dL
Specific Gravity, Urine: 1.009 (ref 1.005–1.030)
pH: 7 (ref 5.0–8.0)

## 2020-09-10 LAB — CBC
HCT: 42.7 % (ref 36.0–46.0)
Hemoglobin: 13.7 g/dL (ref 12.0–15.0)
MCH: 29.3 pg (ref 26.0–34.0)
MCHC: 32.1 g/dL (ref 30.0–36.0)
MCV: 91.2 fL (ref 80.0–100.0)
Platelets: 214 10*3/uL (ref 150–400)
RBC: 4.68 MIL/uL (ref 3.87–5.11)
RDW: 13.5 % (ref 11.5–15.5)
WBC: 8 10*3/uL (ref 4.0–10.5)
nRBC: 0 % (ref 0.0–0.2)

## 2020-09-10 LAB — COMPREHENSIVE METABOLIC PANEL
ALT: 17 U/L (ref 0–44)
AST: 15 U/L (ref 15–41)
Albumin: 4.1 g/dL (ref 3.5–5.0)
Alkaline Phosphatase: 81 U/L (ref 38–126)
Anion gap: 10 (ref 5–15)
BUN: 8 mg/dL (ref 8–23)
CO2: 25 mmol/L (ref 22–32)
Calcium: 10.2 mg/dL (ref 8.9–10.3)
Chloride: 104 mmol/L (ref 98–111)
Creatinine, Ser: 0.87 mg/dL (ref 0.44–1.00)
GFR, Estimated: >60 mL/min (ref >60)
Glucose, Bld: 170 mg/dL — ABNORMAL HIGH (ref 70–99)
Potassium: 4.1 mmol/L (ref 3.5–5.1)
Sodium: 139 mmol/L (ref 135–145)
Total Bilirubin: 0.5 mg/dL (ref 0.3–1.2)
Total Protein: 7.1 g/dL (ref 6.5–8.1)

## 2020-09-10 LAB — LIPASE, BLOOD: Lipase: 25 U/L (ref 11–51)

## 2020-09-10 MED ORDER — FENTANYL CITRATE (PF) 100 MCG/2ML IJ SOLN
50.0000 ug | Freq: Once | INTRAMUSCULAR | Status: AC
  Administered 2020-09-10: 50 ug via INTRAVENOUS
  Filled 2020-09-10: qty 2

## 2020-09-10 MED ORDER — ONDANSETRON HCL 4 MG/2ML IJ SOLN
4.0000 mg | Freq: Once | INTRAMUSCULAR | Status: AC
  Administered 2020-09-10: 4 mg via INTRAVENOUS
  Filled 2020-09-10: qty 2

## 2020-09-10 MED ORDER — SODIUM CHLORIDE 0.9 % IV BOLUS
1000.0000 mL | Freq: Once | INTRAVENOUS | Status: AC
  Administered 2020-09-10: 1000 mL via INTRAVENOUS

## 2020-09-10 NOTE — ED Provider Notes (Signed)
[facility] Provider Note  CSN: [id number] Arrival date & time: 09/10/20 1124    History Chief Complaint  Patient presents with   Abdominal Pain    [name] [name] is a 75 y.o. female presents to the ED today for re-evaluation of abdominal pain. She was seen in the ED for same on 6/17, had negative labs but CT showed mild/early diverticulitis. She was given Rx for Augmentin and oxycodone. She reports no improvement. She returned 7/1 and repeat CT then was unchanged. She was given another course of augmentin. She has had continued pain, now also reports nausea and diarrhea. No fever no blood in stool. She feels dehydrated because she has diarrhea every time she eats. No vomiting.    Past Medical History:  Diagnosis Date   Alcohol abuse    Anxiety    takes Valium daily as needed and Ativan daily   Bilateral hearing loss    Bipolar 1 disorder ([place])    takes Lithium nightly and Synthroid daily   Chronic back pain    DDD; "all over" (09/14/2017)   Colitis, ischemic ([place]) 2012   Confusion    r/t meds   Depression    takes Prozac daily and Bupspirone    Diverticulosis    Dyslipidemia    takes Crestor daily   Fibromyalgia    Gastroparesis    Headache    "weekly" (09/14/2017)   Hepatic steatosis 06/18/12   severe   Hyperlipidemia    Hypertension    Hypothyroidism    IBS (irritable bowel syndrome)    Ischemic colitis ([place])    Joint pain    Joint swelling    Lupus erythematosus tumidus    tumid-skin   Migraine    "1-2/yr; maybe" (09/14/2017)   Numbness    in right foot   Osteoarthritis    "all over" (09/14/2017)   Osteoarthritis cervical spine    Osteoarthritis of hand    bilateral   Pneumonia    "walking pneumonia several times; long time since the last time" (09/14/2017)   Restless leg syndrome    takes Requip nightly   Sciatica    Type II diabetes mellitus ([place])    Urinary frequency    Urinary leakage    Urinary urgency    Urinary,  incontinence, stress female    Walking pneumonia    last time more than 67yrs ago    Past Surgical History:  Procedure Laterality Date   ABDOMINAL HYSTERECTOMY     "they left my ovaries"   APPENDECTOMY     BALLOON DILATION N/A 06/14/2020   Procedure: BALLOON DILATION;  Surgeon: Ronnette Juniper, MD;  Location: [facility];  Service: Gastroenterology;  Laterality: N/A;   BIOPSY  06/14/2020   Procedure: BIOPSY;  Surgeon: Ronnette Juniper, MD;  Location: [facility];  Service: Gastroenterology;;   CARDIAC CATHETERIZATION  09/14/2017   COLONOSCOPY     DENTAL SURGERY Left 10/2016   dental implant   DILATION AND CURETTAGE OF UTERUS  X 4   ESOPHAGOGASTRODUODENOSCOPY     ESOPHAGOGASTRODUODENOSCOPY (EGD) WITH PROPOFOL N/A 06/14/2020   Procedure: ESOPHAGOGASTRODUODENOSCOPY (EGD) WITH PROPOFOL;  Surgeon: Ronnette Juniper, MD;  Location: [facility];  Service: Gastroenterology;  Laterality: N/A;   FLEXIBLE SIGMOIDOSCOPY N/A 06/21/2012   Procedure: FLEXIBLE SIGMOIDOSCOPY;  Surgeon: Jerene Bears, MD;  Location: [facility];  Service: Gastroenterology;  Laterality: N/A;   JOINT REPLACEMENT     LEFT HEART CATH AND CORONARY ANGIOGRAPHY N/A 09/14/2017   Procedure: LEFT  HEART CATH AND CORONARY ANGIOGRAPHY;  Surgeon: Belva Crome, MD;  Location: [facility];  Service: Cardiovascular;  Laterality: N/A;   SHOULDER ARTHROSCOPY Right    "shaved spurs off rotator cuff"   TONSILLECTOMY     TOTAL HIP ARTHROPLASTY Right 06/09/2013   Procedure: TOTAL HIP ARTHROPLASTY;  Surgeon: Kerin Salen, MD;  Location: [facility];  Service: Orthopedics;  Laterality: Right;   TOTAL KNEE ARTHROPLASTY Left 02/07/2019   Procedure: TOTAL KNEE ARTHROPLASTY;  Surgeon: Paralee Cancel, MD;  Location: [facility];  Service: Orthopedics;  Laterality: Left;  70 mins   TUBAL LIGATION     TUMOR EXCISION Right 1968   angle of jaw; benign    Family History  Problem Relation Age of Onset   Drug abuse Mother    Alcohol abuse Mother    Alcohol abuse Father     Hypertension Father    CAD Brother    Hypertension Brother    Alcohol abuse Brother    Hypertension Brother    Hypertension Brother    Psoriasis Daughter    Arthritis Daughter        psoriatic arthritis    Alcohol abuse Grandchild     Social History   Tobacco Use   Smoking status: Never   Smokeless tobacco: Never  Vaping Use   Vaping Use: Never used  Substance Use Topics   Alcohol use: Not Currently    Comment: 09/14/2017 "nothing since 04/15/2008"   Drug use: Not Currently    Frequency: 7.0 times per week    Types: Benzodiazepines     Home Medications Prior to Admission medications   Medication Sig Start Date End Date Taking? Authorizing Provider  ALPRAZolam Duanne Moron) 0.5 MG tablet Take 1 tablet (0.5 mg total) by mouth 4 (four) times daily as needed for anxiety. 08/29/20  Yes [name], PMHNP  amoxicillin-clavulanate (AUGMENTIN) 875-125 MG tablet Take 1 tablet by mouth every 12 (twelve) hours. 09/06/20  Yes Luna Fuse, MD  atorvastatin (LIPITOR) 40 MG tablet Take 40 mg by mouth every evening.   Yes [provider]  b complex vitamins capsule Take 1 capsule by mouth daily.   Yes [provider]  BD PEN NEEDLE NANO 2ND GEN 32G X 4 MM MISC USE TO INJECT 5 TIMES DAILY AS DIRECTED 10/17/19  Yes Elayne Snare, MD  cholecalciferol (VITAMIN D3) 25 MCG (1000 UNIT) tablet Take 3,000 Units by mouth daily.   Yes [provider]  divalproex (DEPAKOTE) 250 MG DR tablet Take 2 tablets (500 mg total) by mouth at bedtime. 08/07/20  Yes [name], PMHNP  donepezil (ARICEPT) 10 MG tablet Take 1 tablet (10 mg total) by mouth at bedtime. 08/07/20  Yes [name], PMHNP  gabapentin (NEURONTIN) 600 MG tablet Take 600 mg by mouth 3 (three) times daily. 05/12/19  Yes [provider]  glucose blood (CONTOUR NEXT TEST) test strip USE TO TEST BLOOD SUGAR TWICE DAILY 06/20/20  Yes Elayne Snare, MD  insulin glargine (LANTUS SOLOSTAR) 100 UNIT/ML Solostar Pen Inject  64 Units into the skin at bedtime. 08/13/20  Yes Elayne Snare, MD  levothyroxine (SYNTHROID) 75 MCG tablet TAKE 1 TABLET BY MOUTH EVERY DAY BEFORE BREAKFAST Patient taking differently: Take 75 mcg by mouth daily before breakfast. 02/22/20  Yes Elayne Snare, MD  lithium carbonate (ESKALITH) 450 MG CR tablet Take 1 tablet (450 mg total) by mouth at bedtime. 08/07/20  Yes [name], PMHNP  loperamide (IMODIUM A-D) 2 MG tablet Take 2  mg by mouth 2 (two) times daily as needed for diarrhea or loose stools.   Yes [provider]  losartan (COZAAR) 25 MG tablet Take 25 mg by mouth daily. 08/31/19  Yes [provider]  Melatonin 10 MG TBCR Take 10 mg by mouth at bedtime.   Yes [provider]  memantine (NAMENDA) 10 MG tablet TAKE 1 TABLET(10 MG) BY MOUTH EVERY EVENING 08/07/20  Yes [name], PMHNP  Microlet Lancets MISC TEST DAILY AS DIRECTED 06/20/20  Yes Elayne Snare, MD  OLANZapine (ZYPREXA) 2.5 MG tablet Take 2 tablets (5 mg total) by mouth at bedtime. 08/29/20  Yes [name], PMHNP  QUEtiapine (SEROQUEL) 200 MG tablet TAKE 1 TAB PO q 5 pm and 1 tab po QHS 08/07/20  Yes [name], PMHNP  rOPINIRole (REQUIP) 0.5 MG tablet Takes 4 tabs at 5 pm and 3 tabs at bedtime and 1-2 tabs prn 08/07/20  Yes [name], PMHNP  Semaglutide, 1 MG/DOSE, (OZEMPIC, 1 MG/DOSE,) 2 MG/1.5ML SOPN Inject 1 mg into the skin once a week. 08/13/20  Yes Elayne Snare, MD  acetaminophen (TYLENOL) 500 MG tablet Take 1,000 mg by mouth 3 (three) times daily as needed for moderate pain.     [provider]  nitroGLYCERIN (NITROSTAT) 0.4 MG SL tablet Place 1 tablet (0.4 mg total) under the tongue every 5 (five) minutes as needed for chest pain. Patient not taking: No sig reported 09/14/17   Lyda Jester M, PA-C  ondansetron (ZOFRAN) 4 MG tablet Take 4 mg by mouth every 8 (eight) hours as needed for nausea or vomiting. 11/26/19   [provider]     Allergies    Codeine,  Procaine hcl, Aspirin, Benadryl [diphenhydramine], Diflucan [fluconazole], Dilaudid [hydromorphone hcl], Other, Tramadol hcl, and Sulfa antibiotics   Review of Systems   Review of Systems A comprehensive review of systems was completed and negative except as noted in HPI.    Physical Exam BP 131/69 (BP Location: Left Arm)   Pulse 68   Temp 98.8 F (37.1 C) (Oral)   Resp 16   Ht 5\' 2"  (1.575 m)   Wt 81.6 kg   SpO2 98%   BMI 32.90 kg/m   Physical Exam Vitals and nursing note reviewed.  Constitutional:      Appearance: Normal appearance.  HENT:     Head: Normocephalic and atraumatic.     Nose: Nose normal.     Mouth/Throat:     Mouth: Mucous membranes are moist.  Eyes:     Extraocular Movements: Extraocular movements intact.     Conjunctiva/sclera: Conjunctivae normal.  Cardiovascular:     Rate and Rhythm: Normal rate.  Pulmonary:     Effort: Pulmonary effort is normal.     Breath sounds: Normal breath sounds.  Abdominal:     General: Abdomen is flat.     Palpations: Abdomen is soft.     Tenderness: There is generalized abdominal tenderness (diffuse, no focal LLQ pain, no peritoneal signs). There is no guarding. Negative signs include Murphy's sign and McBurney's sign.  Musculoskeletal:        General: No swelling. Normal range of motion.     Cervical back: Neck supple.  Skin:    General: Skin is warm and dry.  Neurological:     General: No focal deficit present.     Mental Status: She is alert.  Psychiatric:        Mood and Affect: Mood normal.     ED Results /  Procedures / Treatments   Labs (all labs ordered are listed, but only abnormal results are displayed) Labs Reviewed  COMPREHENSIVE METABOLIC PANEL - Abnormal; Notable for the following components:      Result Value   Glucose, Bld 170 (*)    All other components within normal limits  URINALYSIS, ROUTINE W REFLEX MICROSCOPIC - Abnormal; Notable for the following components:   Color, Urine COLORLESS  (*)    All other components within normal limits  LIPASE, BLOOD  CBC    EKG None   Radiology No results found.  Procedures Procedures  Medications Ordered in the ED Medications  fentaNYL (SUBLIMAZE) injection 50 mcg (50 mcg Intravenous Given 09/10/20 1217)  ondansetron (ZOFRAN) injection 4 mg (4 mg Intravenous Given 09/10/20 1215)  sodium chloride 0.9 % bolus 1,000 mL (0 mLs Intravenous Stopped 09/10/20 1324)     MDM Rules/Calculators/A&P MDM Patient with recurrent abdominal pain. Previous CT with mild diverticulitis x 2, but not improved with Augmentin. She has had 8 CT scans in the last year for various ED visits for abdominal pain. Mostly they have been negative. She had one in Oct 2021 with diffuse colitis and another in May 2022 with possible abnormality in pancreatic head that was not present on subsequent MRCP. Will check labs, treat pain/nausea and give IVF. Will avoid additional CT scans unless there is a significant change in her labs. Exam is benign. Vitals are normal.   ED Course  I have reviewed the triage vital signs and the nursing notes.  Pertinent labs & imaging results that were available during my care of the patient were reviewed by me and considered in my medical decision making (see chart for details).  Clinical Course as of 09/10/20 1542  Tue Sep 10, 2020  1211 CBC is normal.  [CS]  1229 CMP and Lipase are normal.  [CS]  1421 UA is negative.  [CS]  2355 Labs are unremarkable. Abdomen remains benign. Low concern for complication from previously diagnosed diverticulitis. Recommend she follow up with GI for further investigation and management of her symptoms. [CS]    Clinical Course User Index [CS] Truddie Hidden, MD    Final Clinical Impression(s) / ED Diagnoses Final diagnoses:  Chronic abdominal pain    Rx / DC Orders ED Discharge Orders     None        Truddie Hidden, MD 09/10/20 1542

## 2020-09-10 NOTE — ED Notes (Signed)
Visually checked on Pt she stated that her pain was pretty much gone and she felt much better. Pt's Oxygen levels WDL.

## 2020-09-10 NOTE — ED Notes (Signed)
Patient placed on [place] for O2 sat of 88-91%. Patient given pain medicine by RN. Will wean O2 as tolerated.

## 2020-09-10 NOTE — ED Triage Notes (Signed)
Patient reports to the ER for abdominal pain that she reports is her diverticulitis and it is not improving. Patient reports increased pain and diarrhea over the last several days

## 2020-09-10 NOTE — ED Notes (Signed)
Patient verbalizes understanding of discharge instructions. Opportunity for questioning and answers were provided. Patient discharged from ED.  °

## 2020-09-11 DIAGNOSIS — K5732 Diverticulitis of large intestine without perforation or abscess without bleeding: Secondary | ICD-10-CM | POA: Diagnosis not present

## 2020-09-11 DIAGNOSIS — R197 Diarrhea, unspecified: Secondary | ICD-10-CM | POA: Diagnosis not present

## 2020-09-11 DIAGNOSIS — A09 Infectious gastroenteritis and colitis, unspecified: Secondary | ICD-10-CM | POA: Diagnosis not present

## 2020-09-11 DIAGNOSIS — K582 Mixed irritable bowel syndrome: Secondary | ICD-10-CM | POA: Diagnosis not present

## 2020-09-11 DIAGNOSIS — R1030 Lower abdominal pain, unspecified: Secondary | ICD-10-CM | POA: Diagnosis not present

## 2020-09-12 DIAGNOSIS — R197 Diarrhea, unspecified: Secondary | ICD-10-CM | POA: Diagnosis not present

## 2020-09-12 DIAGNOSIS — A09 Infectious gastroenteritis and colitis, unspecified: Secondary | ICD-10-CM | POA: Diagnosis not present

## 2020-09-16 ENCOUNTER — Other Ambulatory Visit: Payer: Self-pay

## 2020-09-16 ENCOUNTER — Emergency Department (HOSPITAL_BASED_OUTPATIENT_CLINIC_OR_DEPARTMENT_OTHER)
Admission: EM | Admit: 2020-09-16 | Discharge: 2020-09-16 | Disposition: A | Discharge status: Home / Self Care | Payer: Medicare Other | Attending: Emergency Medicine | Admitting: Emergency Medicine

## 2020-09-16 ENCOUNTER — Encounter (HOSPITAL_BASED_OUTPATIENT_CLINIC_OR_DEPARTMENT_OTHER): Payer: Self-pay | Admitting: Obstetrics and Gynecology

## 2020-09-16 DIAGNOSIS — Z79899 Other long term (current) drug therapy: Secondary | ICD-10-CM | POA: Diagnosis not present

## 2020-09-16 DIAGNOSIS — I10 Essential (primary) hypertension: Secondary | ICD-10-CM | POA: Diagnosis not present

## 2020-09-16 DIAGNOSIS — E1140 Type 2 diabetes mellitus with diabetic neuropathy, unspecified: Secondary | ICD-10-CM | POA: Insufficient documentation

## 2020-09-16 DIAGNOSIS — Z794 Long term (current) use of insulin: Secondary | ICD-10-CM | POA: Insufficient documentation

## 2020-09-16 DIAGNOSIS — A0472 Enterocolitis due to Clostridium difficile, not specified as recurrent: Secondary | ICD-10-CM | POA: Insufficient documentation

## 2020-09-16 DIAGNOSIS — Z96652 Presence of left artificial knee joint: Secondary | ICD-10-CM | POA: Diagnosis not present

## 2020-09-16 DIAGNOSIS — Z96641 Presence of right artificial hip joint: Secondary | ICD-10-CM | POA: Insufficient documentation

## 2020-09-16 DIAGNOSIS — E1142 Type 2 diabetes mellitus with diabetic polyneuropathy: Secondary | ICD-10-CM | POA: Diagnosis not present

## 2020-09-16 DIAGNOSIS — E039 Hypothyroidism, unspecified: Secondary | ICD-10-CM | POA: Diagnosis not present

## 2020-09-16 DIAGNOSIS — R197 Diarrhea, unspecified: Secondary | ICD-10-CM | POA: Diagnosis present

## 2020-09-16 LAB — CBC WITH DIFFERENTIAL/PLATELET
Abs Immature Granulocytes: 0.05 10*3/uL (ref 0.00–0.07)
Basophils Absolute: 0 10*3/uL (ref 0.0–0.1)
Basophils Relative: 1 %
Eosinophils Absolute: 0.2 10*3/uL (ref 0.0–0.5)
Eosinophils Relative: 2 %
HCT: 44 % (ref 36.0–46.0)
Hemoglobin: 14.1 g/dL (ref 12.0–15.0)
Immature Granulocytes: 1 %
Lymphocytes Relative: 22 %
Lymphs Abs: 1.8 10*3/uL (ref 0.7–4.0)
MCH: 29.6 pg (ref 26.0–34.0)
MCHC: 32 g/dL (ref 30.0–36.0)
MCV: 92.4 fL (ref 80.0–100.0)
Monocytes Absolute: 0.4 10*3/uL (ref 0.1–1.0)
Monocytes Relative: 5 %
Neutro Abs: 5.8 10*3/uL (ref 1.7–7.7)
Neutrophils Relative %: 69 %
Platelets: 226 10*3/uL (ref 150–400)
RBC: 4.76 MIL/uL (ref 3.87–5.11)
RDW: 13.7 % (ref 11.5–15.5)
WBC: 8.2 10*3/uL (ref 4.0–10.5)
nRBC: 0 % (ref 0.0–0.2)

## 2020-09-16 LAB — BASIC METABOLIC PANEL
Anion gap: 10 (ref 5–15)
BUN: 10 mg/dL (ref 8–23)
CO2: 26 mmol/L (ref 22–32)
Calcium: 10.3 mg/dL (ref 8.9–10.3)
Chloride: 104 mmol/L (ref 98–111)
Creatinine, Ser: 0.87 mg/dL (ref 0.44–1.00)
GFR, Estimated: >60 mL/min (ref >60)
Glucose, Bld: 111 mg/dL — ABNORMAL HIGH (ref 70–99)
Potassium: 4.2 mmol/L (ref 3.5–5.1)
Sodium: 140 mmol/L (ref 135–145)

## 2020-09-16 MED ORDER — OXYCODONE-ACETAMINOPHEN 5-325 MG PO TABS: 1.0000 | ORAL_TABLET | Freq: Four times a day (QID) | ORAL | 0 refills | Status: DC | PRN

## 2020-09-16 MED ORDER — SODIUM CHLORIDE 0.9 % IV BOLUS
1000.0000 mL | Freq: Once | INTRAVENOUS | Status: AC
  Administered 2020-09-16: 1000 mL via INTRAVENOUS

## 2020-09-16 MED ORDER — DICYCLOMINE HCL 10 MG PO CAPS
10.0000 mg | ORAL_CAPSULE | Freq: Once | ORAL | Status: AC
  Administered 2020-09-16: 10 mg via ORAL
  Filled 2020-09-16: qty 1

## 2020-09-16 NOTE — ED Provider Notes (Signed)
[facility] EMERGENC[facility] DEPT Provider Note   CSN: [id number] Arrival date & time: 09/16/20  1402     History Chief Complaint  Patient presents with   Abdominal Cramping    [name] [name] is a 75 y.o. female.  Patient is a 75 year old female who presents with diarrhea and abdominal cramping.  She has had the symptoms for about 2 weeks.  Initially she was diagnosed with possible diverticulitis and took a course of Augmentin.  She has had ongoing diarrhea several times a day.  She states that this past Thursday her GI doctor ordered a C. difficile test and she was called today and told that it was positive.  She was called in a prescription for vancomycin which she has not yet filled.  She was told to come here to possibly get IV fluids and some pain medicine for her cramping.  She has had explosive diarrhea over the last few days it is gotten a bit worse.  It is watery and nonbloody.  She has some intermittent abdominal cramping.  No known fevers.  No nausea or vomiting.  She has general weakness and fatigue.  She also has some lightheadedness on standing.      Past Medical History:  Diagnosis Date   Alcohol abuse    Anxiety    takes Valium daily as needed and Ativan daily   Bilateral hearing loss    Bipolar 1 disorder ([place])    takes Lithium nightly and Synthroid daily   Chronic back pain    DDD; "all over" (09/14/2017)   Colitis, ischemic ([place]) 2012   Confusion    r/t meds   Depression    takes Prozac daily and Bupspirone    Diverticulosis    Dyslipidemia    takes Crestor daily   Fibromyalgia    Gastroparesis    Headache    "weekly" (09/14/2017)   Hepatic steatosis 06/18/12   severe   Hyperlipidemia    Hypertension    Hypothyroidism    IBS (irritable bowel syndrome)    Ischemic colitis ([place])    Joint pain    Joint swelling    Lupus erythematosus tumidus    tumid-skin   Migraine    "1-2/yr; maybe" (09/14/2017)   Numbness    in right foot    Osteoarthritis    "all over" (09/14/2017)   Osteoarthritis cervical spine    Osteoarthritis of hand    bilateral   Pneumonia    "walking pneumonia several times; long time since the last time" (09/14/2017)   Restless leg syndrome    takes Requip nightly   Sciatica    Type II diabetes mellitus ([place])    Urinary frequency    Urinary leakage    Urinary urgency    Urinary, incontinence, stress female    Walking pneumonia    last time more than 80yrs ago    Patient Active Problem List   Diagnosis Date Noted   Intractable abdominal pain 07/10/2020   Pancreatic abnormality 07/10/2020   Diabetes mellitus type 2 in obese ([place]) 07/10/2020   Essential hypertension 07/10/2020   Bipolar 1 disorder, mixed, moderate ([place])    Suicidal ideations    Overweight (BMI 25.0-29.9) 02/08/2019   S/P left TKA 02/07/2019   Chest pain    Chest pain with normal coronary angiography    Diverticulitis 11/19/2016   Hypothyroidism 07/14/2016   Diarrhea    Generalized abdominal pain    Major depressive disorder, recurrent severe without psychotic features ([place]) 12/31/2015  Nausea without vomiting    Colitis 10/28/2015   Unintentional poisoning by psychotropic drug 07/05/2015   Restless leg syndrome 07/05/2015   Diabetic polyneuropathy associated with type 2 diabetes mellitus ([place]) 09/21/2014   Arthritis of left hip 06/09/2013   Arthritis of right hip 06/08/2013   Hip pain, right 05/02/2013   IBS (irritable bowel syndrome) 01/02/2013   Unspecified vitamin D deficiency 07/05/2012   Pure hyperglyceridemia 07/04/2012   Diabetes mellitus with diabetic neuropathy, without long-term current use of insulin ([place]) 06/18/2012   Hypertension 06/18/2012   Bipolar 1 disorder ([place]) 06/18/2012   Low back pain 02/25/2011   Acute ischemic colitis ([place]) 01/06/2011   Sciatica 12/19/2010   Fibromyalgia 10/28/2010   Primary osteoarthritis of right hip 10/24/2010   Hyperlipidemia with target low density lipoprotein (LDL)  cholesterol less than 100 mg/dL 05/05/2010   DEPRESSION/ANXIETY 05/05/2010   ABUSE, ALCOHOL, IN REMISSION 05/05/2010   OTITIS MEDIA, CHRONIC 05/05/2010   Hearing loss 05/05/2010   ALLERGIC RHINITIS, SEASONAL, MILD 05/05/2010   URINARY INCONTINENCE, STRESS, FEMALE 05/05/2010   OSTEOARTHRITIS, HANDS, BILATERAL 05/05/2010   OSTEOARTHRITIS, CERVICAL SPINE 05/05/2010    Past Surgical History:  Procedure Laterality Date   ABDOMINAL HYSTERECTOMY     "they left my ovaries"   APPENDECTOMY     BALLOON DILATION N/A 06/14/2020   Procedure: BALLOON DILATION;  Surgeon: Ronnette Juniper, MD;  Location: [facility];  Service: Gastroenterology;  Laterality: N/A;   BIOPSY  06/14/2020   Procedure: BIOPSY;  Surgeon: Ronnette Juniper, MD;  Location: [facility];  Service: Gastroenterology;;   CARDIAC CATHETERIZATION  09/14/2017   COLONOSCOPY     DENTAL SURGERY Left 10/2016   dental implant   DILATION AND CURETTAGE OF UTERUS  X 4   ESOPHAGOGASTRODUODENOSCOPY     ESOPHAGOGASTRODUODENOSCOPY (EGD) WITH PROPOFOL N/A 06/14/2020   Procedure: ESOPHAGOGASTRODUODENOSCOPY (EGD) WITH PROPOFOL;  Surgeon: Ronnette Juniper, MD;  Location: [facility];  Service: Gastroenterology;  Laterality: N/A;   FLEXIBLE SIGMOIDOSCOPY N/A 06/21/2012   Procedure: FLEXIBLE SIGMOIDOSCOPY;  Surgeon: Jerene Bears, MD;  Location: [facility];  Service: Gastroenterology;  Laterality: N/A;   JOINT REPLACEMENT     LEFT HEART CATH AND CORONARY ANGIOGRAPHY N/A 09/14/2017   Procedure: LEFT HEART CATH AND CORONARY ANGIOGRAPHY;  Surgeon: Belva Crome, MD;  Location: [facility];  Service: Cardiovascular;  Laterality: N/A;   SHOULDER ARTHROSCOPY Right    "shaved spurs off rotator cuff"   TONSILLECTOMY     TOTAL HIP ARTHROPLASTY Right 06/09/2013   Procedure: TOTAL HIP ARTHROPLASTY;  Surgeon: Kerin Salen, MD;  Location: [facility];  Service: Orthopedics;  Laterality: Right;   TOTAL KNEE ARTHROPLASTY Left 02/07/2019   Procedure: TOTAL KNEE ARTHROPLASTY;   Surgeon: Paralee Cancel, MD;  Location: [facility];  Service: Orthopedics;  Laterality: Left;  70 mins   TUBAL LIGATION     TUMOR EXCISION Right 1968   angle of jaw; benign     OB History     Gravida  2   Para  2   Term      Preterm      AB      Living         SAB      IAB      Ectopic      Multiple      Live Births              Family History  Problem Relation Age of Onset   Drug abuse Mother  Alcohol abuse Mother    Alcohol abuse Father    Hypertension Father    CAD Brother    Hypertension Brother    Alcohol abuse Brother    Hypertension Brother    Hypertension Brother    Psoriasis Daughter    Arthritis Daughter        psoriatic arthritis    Alcohol abuse Grandchild     Social History   Tobacco Use   Smoking status: Never   Smokeless tobacco: Never  Vaping Use   Vaping Use: Never used  Substance Use Topics   Alcohol use: Not Currently    Comment: 09/14/2017 "nothing since 04/15/2008"   Drug use: Not Currently    Frequency: 7.0 times per week    Types: Benzodiazepines    Home Medications Prior to Admission medications   Medication Sig Start Date End Date Taking? Authorizing Provider  oxyCODONE-acetaminophen (PERCOCET/ROXICET) 5-325 MG tablet Take 1 tablet by mouth every 6 (six) hours as needed for severe pain. 09/16/20  Yes Malvin Johns, MD  ALPRAZolam Duanne Moron) 0.5 MG tablet Take 1 tablet (0.5 mg total) by mouth 4 (four) times daily as needed for anxiety. 08/29/20   [name], PMHNP  amoxicillin-clavulanate (AUGMENTIN) 875-125 MG tablet Take 1 tablet by mouth every 12 (twelve) hours. 09/06/20   Luna Fuse, MD  atorvastatin (LIPITOR) 40 MG tablet Take 40 mg by mouth every evening.    [provider]  b complex vitamins capsule Take 1 capsule by mouth daily.    [provider]  BD PEN NEEDLE NANO 2ND GEN 32G X 4 MM MISC USE TO INJECT 5 TIMES DAILY AS DIRECTED 10/17/19   Elayne Snare, MD  cholecalciferol (VITAMIN D3) 25 MCG  (1000 UNIT) tablet Take 3,000 Units by mouth daily.    [provider]  divalproex (DEPAKOTE) 250 MG DR tablet Take 2 tablets (500 mg total) by mouth at bedtime. 08/07/20   [name], PMHNP  donepezil (ARICEPT) 10 MG tablet Take 1 tablet (10 mg total) by mouth at bedtime. 08/07/20   [name], PMHNP  gabapentin (NEURONTIN) 600 MG tablet Take 600 mg by mouth 3 (three) times daily. 05/12/19   [provider]  glucose blood (CONTOUR NEXT TEST) test strip USE TO TEST BLOOD SUGAR TWICE DAILY 06/20/20   Elayne Snare, MD  insulin glargine (LANTUS SOLOSTAR) 100 UNIT/ML Solostar Pen Inject 64 Units into the skin at bedtime. 08/13/20   Elayne Snare, MD  levothyroxine (SYNTHROID) 75 MCG tablet TAKE 1 TABLET BY MOUTH EVERY DAY BEFORE BREAKFAST Patient taking differently: Take 75 mcg by mouth daily before breakfast. 02/22/20   Elayne Snare, MD  lithium carbonate (ESKALITH) 450 MG CR tablet Take 1 tablet (450 mg total) by mouth at bedtime. 08/07/20   [name], PMHNP  loperamide (IMODIUM A-D) 2 MG tablet Take 2 mg by mouth 2 (two) times daily as needed for diarrhea or loose stools.    [provider]  losartan (COZAAR) 25 MG tablet Take 25 mg by mouth daily. 08/31/19   [provider]  Melatonin 10 MG TBCR Take 10 mg by mouth at bedtime.    [provider]  memantine (NAMENDA) 10 MG tablet TAKE 1 TABLET(10 MG) BY MOUTH EVERY EVENING 08/07/20   [name], PMHNP  Microlet Lancets MISC TEST DAILY AS DIRECTED 06/20/20   Elayne Snare, MD  nitroGLYCERIN (NITROSTAT) 0.4 MG SL tablet Place 1 tablet (0.4 mg total) under the tongue every 5 (five) minutes as needed for chest  pain. Patient not taking: No sig reported 09/14/17   Lyda Jester M, PA-C  OLANZapine (ZYPREXA) 2.5 MG tablet Take 2 tablets (5 mg total) by mouth at bedtime. 08/29/20   [name], PMHNP  ondansetron (ZOFRAN) 4 MG tablet Take 4 mg by mouth every 8 (eight) hours as needed for nausea or vomiting.  11/26/19   [provider]  QUEtiapine (SEROQUEL) 200 MG tablet TAKE 1 TAB PO q 5 pm and 1 tab po QHS 08/07/20   [name], PMHNP  rOPINIRole (REQUIP) 0.5 MG tablet Takes 4 tabs at 5 pm and 3 tabs at bedtime and 1-2 tabs prn 08/07/20   [name], PMHNP  Semaglutide, 1 MG/DOSE, (OZEMPIC, 1 MG/DOSE,) 2 MG/1.5ML SOPN Inject 1 mg into the skin once a week. 08/13/20   Elayne Snare, MD    Allergies    Codeine, Procaine hcl, Aspirin, Benadryl [diphenhydramine], Diflucan [fluconazole], Dilaudid [hydromorphone hcl], Other, Tramadol hcl, and Sulfa antibiotics  Review of Systems   Review of Systems  Constitutional:  Positive for fatigue. Negative for chills, diaphoresis and fever.  HENT:  Negative for congestion, rhinorrhea and sneezing.   Eyes: Negative.   Respiratory:  Negative for cough, chest tightness and shortness of breath.   Cardiovascular:  Negative for chest pain and leg swelling.  Gastrointestinal:  Positive for abdominal pain and diarrhea. Negative for blood in stool, nausea and vomiting.  Genitourinary:  Negative for difficulty urinating, flank pain, frequency and hematuria.  Musculoskeletal:  Negative for arthralgias and back pain.  Skin:  Negative for rash.  Neurological:  Positive for light-headedness. Negative for dizziness, speech difficulty, weakness, numbness and headaches.   Physical Exam Updated Vital Signs BP (!) 143/61   Pulse 69   Temp 98.4 F (36.9 C) (Oral)   Resp 18   SpO2 98%   Physical Exam Constitutional:      Appearance: She is well-developed.  HENT:     Head: Normocephalic and atraumatic.  Eyes:     Pupils: Pupils are equal, round, and reactive to light.  Cardiovascular:     Rate and Rhythm: Normal rate and regular rhythm.     Heart sounds: Normal heart sounds.  Pulmonary:     Effort: Pulmonary effort is normal. No respiratory distress.     Breath sounds: Normal breath sounds. No wheezing or rales.  Chest:     Chest wall: No tenderness.   Abdominal:     General: Bowel sounds are normal.     Palpations: Abdomen is soft.     Tenderness: There is no abdominal tenderness. There is no guarding or rebound.  Musculoskeletal:        General: Normal range of motion.     Cervical back: Normal range of motion and neck supple.  Lymphadenopathy:     Cervical: No cervical adenopathy.  Skin:    General: Skin is warm and dry.     Findings: No rash.  Neurological:     Mental Status: She is alert and oriented to person, place, and time.    ED Results / Procedures / Treatments   Labs (all labs ordered are listed, but only abnormal results are displayed) Labs Reviewed  BASIC METABOLIC PANEL - Abnormal; Notable for the following components:      Result Value   Glucose, Bld 111 (*)    All other components within normal limits  CBC WITH DIFFERENTIAL/PLATELET    EKG None  Radiology No results found.  Procedures Procedures   Medications Ordered in ED  Medications  sodium chloride 0.9 % bolus 1,000 mL (0 mLs Intravenous Stopped 09/16/20 1915)  dicyclomine (BENTYL) capsule 10 mg (10 mg Oral Given 09/16/20 1754)    ED Course  I have reviewed the triage vital signs and the nursing notes.  Pertinent labs & imaging results that were available during my care of the patient were reviewed by me and considered in my medical decision making (see chart for details).    MDM Rules/Calculators/A&P                          Patient is a 75 year old female who presents with possible dehydration after recent diagnosis of C. difficile.  She was given IV fluids.  Her labs are nonconcerning.  Her vital signs are stable.  She is generally weak but at this point does not require admission.  She will start her vancomycin tonight.  She was given a prescription for short course of oxycodone for her pain.  She says that she cannot take any other pain medications other than that.  Return precautions were given. Final Clinical Impression(s) / ED  Diagnoses Final diagnoses:  C. difficile colitis    Rx / DC Orders ED Discharge Orders          Ordered    oxyCODONE-acetaminophen (PERCOCET/ROXICET) 5-325 MG tablet  Every 6 hours PRN        09/16/20 2023             Malvin Johns, MD 09/16/20 2025

## 2020-09-16 NOTE — ED Triage Notes (Signed)
Patient reports to the ER for C-diff and abdominal cramping. Patient reports she called her PCP and was told to come to the ER for fluids due to the amount of stool she has been passing. Patient has not yet started to take oral vancomycin but her PCP has called in the prescription.

## 2020-09-19 ENCOUNTER — Other Ambulatory Visit: Payer: Self-pay | Admitting: Endocrinology

## 2020-09-19 ENCOUNTER — Encounter: Payer: Self-pay | Admitting: Psychiatry

## 2020-09-19 ENCOUNTER — Telehealth (INDEPENDENT_AMBULATORY_CARE_PROVIDER_SITE_OTHER): Payer: Medicare Other | Admitting: Psychiatry

## 2020-09-19 DIAGNOSIS — F411 Generalized anxiety disorder: Secondary | ICD-10-CM | POA: Diagnosis not present

## 2020-09-19 DIAGNOSIS — Z79899 Other long term (current) drug therapy: Secondary | ICD-10-CM

## 2020-09-19 DIAGNOSIS — M533 Sacrococcygeal disorders, not elsewhere classified: Secondary | ICD-10-CM | POA: Diagnosis not present

## 2020-09-19 MED ORDER — ALPRAZOLAM 0.5 MG PO TABS: 0.5000 mg | ORAL_TABLET | Freq: Four times a day (QID) | ORAL | 2 refills | Status: DC | PRN

## 2020-09-20 ENCOUNTER — Other Ambulatory Visit: Payer: Self-pay | Admitting: Psychiatry

## 2020-09-20 DIAGNOSIS — Z79899 Other long term (current) drug therapy: Secondary | ICD-10-CM | POA: Diagnosis not present

## 2020-09-21 ENCOUNTER — Telehealth: Payer: Self-pay | Admitting: Psychiatry

## 2020-09-21 LAB — LITHIUM LEVEL: Lithium Lvl: 0.7 mmol/L (ref 0.6–1.2)
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
## 2020-09-21 NOTE — Telephone Encounter (Signed)
Left message for patient regarding results from lithium level.  Discussed that most recent lithium level was 0.7, which is within the therapeutic range of 0.6-1.2, and is not near a toxic level; however, her previous lithium levels have been significantly lower and she has reported some possible side effects in the past when she has had lithium levels of 0.6 and 0.7.  Reiterated that recent GI illness and dehydration has most likely caused elevations in her lithium level and if she is continuing to have possible side effects then she could hold lithium for 1-2 nights and then resume normal dose and lithium level should decrease as long as her diarrhea is continuing to improve.  Patient advised to contact office if she has any questions or concerns.

## 2020-09-24 ENCOUNTER — Ambulatory Visit: Payer: Medicare Other | Admitting: Endocrinology

## 2020-09-25 ENCOUNTER — Other Ambulatory Visit: Payer: Medicare Other

## 2020-09-25 ENCOUNTER — Ambulatory Visit: Payer: Medicare Other

## 2020-09-25 DIAGNOSIS — R1030 Lower abdominal pain, unspecified: Secondary | ICD-10-CM | POA: Diagnosis not present

## 2020-09-25 DIAGNOSIS — A0472 Enterocolitis due to Clostridium difficile, not specified as recurrent: Secondary | ICD-10-CM | POA: Diagnosis not present

## 2020-09-25 DIAGNOSIS — M533 Sacrococcygeal disorders, not elsewhere classified: Secondary | ICD-10-CM | POA: Diagnosis not present

## 2020-09-25 DIAGNOSIS — Z20822 Contact with and (suspected) exposure to covid-19: Secondary | ICD-10-CM | POA: Diagnosis not present

## 2020-09-30 DIAGNOSIS — A0472 Enterocolitis due to Clostridium difficile, not specified as recurrent: Secondary | ICD-10-CM | POA: Diagnosis not present

## 2020-10-07 ENCOUNTER — Other Ambulatory Visit: Payer: Self-pay

## 2020-10-07 ENCOUNTER — Encounter (HOSPITAL_BASED_OUTPATIENT_CLINIC_OR_DEPARTMENT_OTHER): Payer: Self-pay | Admitting: *Deleted

## 2020-10-07 ENCOUNTER — Emergency Department (HOSPITAL_BASED_OUTPATIENT_CLINIC_OR_DEPARTMENT_OTHER): Payer: Medicare Other

## 2020-10-07 ENCOUNTER — Inpatient Hospital Stay (HOSPITAL_BASED_OUTPATIENT_CLINIC_OR_DEPARTMENT_OTHER)
Admission: EM | Admit: 2020-10-07 | Discharge: 2020-10-09 | DRG: 392 | Disposition: A | Discharge status: Home / Self Care | Payer: Medicare Other | Attending: Family Medicine | Admitting: Family Medicine

## 2020-10-07 DIAGNOSIS — Z96652 Presence of left artificial knee joint: Secondary | ICD-10-CM | POA: Diagnosis present

## 2020-10-07 DIAGNOSIS — R197 Diarrhea, unspecified: Secondary | ICD-10-CM | POA: Diagnosis present

## 2020-10-07 DIAGNOSIS — Z20822 Contact with and (suspected) exposure to covid-19: Secondary | ICD-10-CM | POA: Diagnosis present

## 2020-10-07 DIAGNOSIS — K580 Irritable bowel syndrome with diarrhea: Principal | ICD-10-CM | POA: Diagnosis present

## 2020-10-07 DIAGNOSIS — E1169 Type 2 diabetes mellitus with other specified complication: Secondary | ICD-10-CM | POA: Diagnosis present

## 2020-10-07 DIAGNOSIS — E1143 Type 2 diabetes mellitus with diabetic autonomic (poly)neuropathy: Secondary | ICD-10-CM | POA: Diagnosis present

## 2020-10-07 DIAGNOSIS — E669 Obesity, unspecified: Secondary | ICD-10-CM | POA: Diagnosis present

## 2020-10-07 DIAGNOSIS — E039 Hypothyroidism, unspecified: Secondary | ICD-10-CM | POA: Diagnosis present

## 2020-10-07 DIAGNOSIS — Z882 Allergy status to sulfonamides status: Secondary | ICD-10-CM

## 2020-10-07 DIAGNOSIS — M19041 Primary osteoarthritis, right hand: Secondary | ICD-10-CM | POA: Diagnosis present

## 2020-10-07 DIAGNOSIS — F32A Depression, unspecified: Secondary | ICD-10-CM | POA: Diagnosis present

## 2020-10-07 DIAGNOSIS — Z96641 Presence of right artificial hip joint: Secondary | ICD-10-CM | POA: Diagnosis present

## 2020-10-07 DIAGNOSIS — N2889 Other specified disorders of kidney and ureter: Secondary | ICD-10-CM | POA: Diagnosis present

## 2020-10-07 DIAGNOSIS — Z9851 Tubal ligation status: Secondary | ICD-10-CM

## 2020-10-07 DIAGNOSIS — G8929 Other chronic pain: Secondary | ICD-10-CM | POA: Diagnosis present

## 2020-10-07 DIAGNOSIS — M19042 Primary osteoarthritis, left hand: Secondary | ICD-10-CM | POA: Diagnosis present

## 2020-10-07 DIAGNOSIS — G2581 Restless legs syndrome: Secondary | ICD-10-CM | POA: Diagnosis present

## 2020-10-07 DIAGNOSIS — F419 Anxiety disorder, unspecified: Secondary | ICD-10-CM | POA: Diagnosis present

## 2020-10-07 DIAGNOSIS — N1330 Unspecified hydronephrosis: Secondary | ICD-10-CM | POA: Diagnosis present

## 2020-10-07 DIAGNOSIS — F319 Bipolar disorder, unspecified: Secondary | ICD-10-CM | POA: Diagnosis present

## 2020-10-07 DIAGNOSIS — K5730 Diverticulosis of large intestine without perforation or abscess without bleeding: Secondary | ICD-10-CM | POA: Diagnosis present

## 2020-10-07 DIAGNOSIS — K3184 Gastroparesis: Secondary | ICD-10-CM | POA: Diagnosis present

## 2020-10-07 DIAGNOSIS — H9193 Unspecified hearing loss, bilateral: Secondary | ICD-10-CM | POA: Diagnosis present

## 2020-10-07 DIAGNOSIS — Z79899 Other long term (current) drug therapy: Secondary | ICD-10-CM

## 2020-10-07 DIAGNOSIS — L930 Discoid lupus erythematosus: Secondary | ICD-10-CM | POA: Diagnosis present

## 2020-10-07 DIAGNOSIS — M47812 Spondylosis without myelopathy or radiculopathy, cervical region: Secondary | ICD-10-CM | POA: Diagnosis present

## 2020-10-07 DIAGNOSIS — R531 Weakness: Secondary | ICD-10-CM | POA: Diagnosis not present

## 2020-10-07 DIAGNOSIS — E785 Hyperlipidemia, unspecified: Secondary | ICD-10-CM | POA: Diagnosis present

## 2020-10-07 DIAGNOSIS — I10 Essential (primary) hypertension: Secondary | ICD-10-CM | POA: Diagnosis present

## 2020-10-07 DIAGNOSIS — Z79890 Hormone replacement therapy (postmenopausal): Secondary | ICD-10-CM

## 2020-10-07 DIAGNOSIS — Z794 Long term (current) use of insulin: Secondary | ICD-10-CM

## 2020-10-07 DIAGNOSIS — R1030 Lower abdominal pain, unspecified: Secondary | ICD-10-CM | POA: Diagnosis not present

## 2020-10-07 DIAGNOSIS — Z886 Allergy status to analgesic agent status: Secondary | ICD-10-CM

## 2020-10-07 DIAGNOSIS — N200 Calculus of kidney: Secondary | ICD-10-CM | POA: Diagnosis not present

## 2020-10-07 DIAGNOSIS — Z8249 Family history of ischemic heart disease and other diseases of the circulatory system: Secondary | ICD-10-CM

## 2020-10-07 DIAGNOSIS — Z885 Allergy status to narcotic agent status: Secondary | ICD-10-CM

## 2020-10-07 DIAGNOSIS — M797 Fibromyalgia: Secondary | ICD-10-CM | POA: Diagnosis present

## 2020-10-07 DIAGNOSIS — Z888 Allergy status to other drugs, medicaments and biological substances status: Secondary | ICD-10-CM

## 2020-10-07 LAB — COMPREHENSIVE METABOLIC PANEL
ALT: 15 U/L (ref 0–44)
AST: 15 U/L (ref 15–41)
Albumin: 4.4 g/dL (ref 3.5–5.0)
Alkaline Phosphatase: 91 U/L (ref 38–126)
Anion gap: 10 (ref 5–15)
BUN: 13 mg/dL (ref 8–23)
CO2: 26 mmol/L (ref 22–32)
Calcium: 10.3 mg/dL (ref 8.9–10.3)
Chloride: 103 mmol/L (ref 98–111)
Creatinine, Ser: 0.81 mg/dL (ref 0.44–1.00)
GFR, Estimated: >60 mL/min (ref >60)
Glucose, Bld: 141 mg/dL — ABNORMAL HIGH (ref 70–99)
Potassium: 3.9 mmol/L (ref 3.5–5.1)
Sodium: 139 mmol/L (ref 135–145)
Total Bilirubin: 0.5 mg/dL (ref 0.3–1.2)
Total Protein: 7.2 g/dL (ref 6.5–8.1)

## 2020-10-07 LAB — CBC WITH DIFFERENTIAL/PLATELET
Abs Immature Granulocytes: 0.09 10*3/uL — ABNORMAL HIGH (ref 0.00–0.07)
Basophils Absolute: 0 10*3/uL (ref 0.0–0.1)
Basophils Relative: 0 %
Eosinophils Absolute: 0.1 10*3/uL (ref 0.0–0.5)
Eosinophils Relative: 1 %
HCT: 45.1 % (ref 36.0–46.0)
Hemoglobin: 14.5 g/dL (ref 12.0–15.0)
Immature Granulocytes: 1 %
Lymphocytes Relative: 19 %
Lymphs Abs: 2.1 10*3/uL (ref 0.7–4.0)
MCH: 29.5 pg (ref 26.0–34.0)
MCHC: 32.2 g/dL (ref 30.0–36.0)
MCV: 91.7 fL (ref 80.0–100.0)
Monocytes Absolute: 0.6 10*3/uL (ref 0.1–1.0)
Monocytes Relative: 5 %
Neutro Abs: 8.5 10*3/uL — ABNORMAL HIGH (ref 1.7–7.7)
Neutrophils Relative %: 74 %
Platelets: 233 10*3/uL (ref 150–400)
RBC: 4.92 MIL/uL (ref 3.87–5.11)
RDW: 13.5 % (ref 11.5–15.5)
WBC: 11.5 10*3/uL — ABNORMAL HIGH (ref 4.0–10.5)
nRBC: 0 % (ref 0.0–0.2)

## 2020-10-07 MED ORDER — FENTANYL CITRATE (PF) 100 MCG/2ML IJ SOLN
50.0000 ug | Freq: Once | INTRAMUSCULAR | Status: AC
  Administered 2020-10-07: 50 ug via INTRAVENOUS
  Filled 2020-10-07: qty 2

## 2020-10-07 MED ORDER — LACTATED RINGERS IV SOLN: INTRAVENOUS | Status: DC

## 2020-10-07 MED ORDER — FENTANYL CITRATE (PF) 100 MCG/2ML IJ SOLN
25.0000 ug | Freq: Once | INTRAMUSCULAR | Status: AC
Start: 2020-10-07 — End: 2020-10-07
  Administered 2020-10-07: 25 ug via INTRAVENOUS
  Filled 2020-10-07: qty 2

## 2020-10-07 MED ORDER — IOHEXOL 9 MG/ML PO SOLN
500.0000 mL | ORAL | Status: AC
  Administered 2020-10-07: 500 mL via ORAL

## 2020-10-07 MED ORDER — ONDANSETRON HCL 4 MG/2ML IJ SOLN
4.0000 mg | Freq: Once | INTRAMUSCULAR | Status: AC
  Administered 2020-10-07: 4 mg via INTRAVENOUS
  Filled 2020-10-07: qty 2

## 2020-10-07 MED ORDER — IOHEXOL 300 MG/ML  SOLN
100.0000 mL | Freq: Once | INTRAMUSCULAR | Status: AC | PRN
  Administered 2020-10-07: 100 mL via INTRAVENOUS

## 2020-10-07 MED ORDER — LACTATED RINGERS IV BOLUS
1000.0000 mL | Freq: Once | INTRAVENOUS | Status: AC
  Administered 2020-10-07: 1000 mL via INTRAVENOUS

## 2020-10-07 NOTE — ED Provider Notes (Signed)
[facility] EMERGENCY DEPT Provider Note   CSN: [id number] Arrival date & time: 10/07/20  1628     History Chief Complaint  Patient presents with   Abdominal Pain    [name] [name] is a 75 y.o. female.  Patient is a 75 year old female with a history of hypertension, hyperlipidemia, hypothyroidism, gastroparesis, recent recurrent diverticulitis in the last month and then developed C. difficile which she just finished oral antibiotics for last week and had a negative C. difficile test who is presenting today with 2 days of worsening lower abdominal pain and recurrent diarrhea.  She is having more than 10 episodes of diarrhea per day and having nausea but denies any vomiting.  No known fevers.  She saw her GI doctor today who sent her for further evaluation to ensure she did not have recurrent diverticulitis.  She denies any cough, congestion.  She does feel generally weak and has had poor appetite.  The history is provided by the patient and medical records.  Abdominal Pain Pain quality: aching and cramping   Pain severity:  Moderate Onset quality:  Gradual Duration:  2 days Timing:  Constant Progression:  Worsening Chronicity:  Recurrent Worsened by:  Movement and palpation Ineffective treatments:  None tried Associated symptoms: anorexia   Associated symptoms: no fever       Past Medical History:  Diagnosis Date   Alcohol abuse    Anxiety    takes Valium daily as needed and Ativan daily   Bilateral hearing loss    Bipolar 1 disorder ([place])    takes Lithium nightly and Synthroid daily   Chronic back pain    DDD; "all over" (09/14/2017)   Colitis, ischemic ([place]) 2012   Confusion    r/t meds   Depression    takes Prozac daily and Bupspirone    Diverticulosis    Dyslipidemia    takes Crestor daily   Fibromyalgia    Gastroparesis    Headache    "weekly" (09/14/2017)   Hepatic steatosis 06/18/12   severe   Hyperlipidemia    Hypertension     Hypothyroidism    IBS (irritable bowel syndrome)    Ischemic colitis ([place])    Joint pain    Joint swelling    Lupus erythematosus tumidus    tumid-skin   Migraine    "1-2/yr; maybe" (09/14/2017)   Numbness    in right foot   Osteoarthritis    "all over" (09/14/2017)   Osteoarthritis cervical spine    Osteoarthritis of hand    bilateral   Pneumonia    "walking pneumonia several times; long time since the last time" (09/14/2017)   Restless leg syndrome    takes Requip nightly   Sciatica    Type II diabetes mellitus ([place])    Urinary frequency    Urinary leakage    Urinary urgency    Urinary, incontinence, stress female    Walking pneumonia    last time more than 4yr ago    Patient Active Problem List   Diagnosis Date Noted   Intractable abdominal pain 07/10/2020   Pancreatic abnormality 07/10/2020   Diabetes mellitus type 2 in obese ([place] 07/10/2020   Essential hypertension 07/10/2020   Bipolar 1 disorder, mixed, moderate ([place]    Suicidal ideations    Overweight (BMI 25.0-29.9) 02/08/2019   S/P left TKA 02/07/2019   Chest pain    Chest pain with normal coronary angiography    Diverticulitis 11/19/2016   Hypothyroidism 07/14/2016  Diarrhea    Generalized abdominal pain    Major depressive disorder, recurrent severe without psychotic features ([place]) 12/31/2015   Nausea without vomiting    Colitis 10/28/2015   Unintentional poisoning by psychotropic drug 07/05/2015   Restless leg syndrome 07/05/2015   Diabetic polyneuropathy associated with type 2 diabetes mellitus ([place]) 09/21/2014   Arthritis of left hip 06/09/2013   Arthritis of right hip 06/08/2013   Hip pain, right 05/02/2013   IBS (irritable bowel syndrome) 01/02/2013   Unspecified vitamin D deficiency 07/05/2012   Pure hyperglyceridemia 07/04/2012   Diabetes mellitus with diabetic neuropathy, without long-term current use of insulin ([place]) 06/18/2012   Hypertension 06/18/2012   Bipolar 1 disorder ([place])  06/18/2012   Low back pain 02/25/2011   Acute ischemic colitis ([place]) 01/06/2011   Sciatica 12/19/2010   Fibromyalgia 10/28/2010   Primary osteoarthritis of right hip 10/24/2010   Hyperlipidemia with target low density lipoprotein (LDL) cholesterol less than 100 mg/dL 05/05/2010   DEPRESSION/ANXIETY 05/05/2010   ABUSE, ALCOHOL, IN REMISSION 05/05/2010   OTITIS MEDIA, CHRONIC 05/05/2010   Hearing loss 05/05/2010   ALLERGIC RHINITIS, SEASONAL, MILD 05/05/2010   URINARY INCONTINENCE, STRESS, FEMALE 05/05/2010   OSTEOARTHRITIS, HANDS, BILATERAL 05/05/2010   OSTEOARTHRITIS, CERVICAL SPINE 05/05/2010    Past Surgical History:  Procedure Laterality Date   ABDOMINAL HYSTERECTOMY     "they left my ovaries"   APPENDECTOMY     BALLOON DILATION N/A 06/14/2020   Procedure: BALLOON DILATION;  Surgeon: Ronnette Juniper, MD;  Location: [facility];  Service: Gastroenterology;  Laterality: N/A;   BIOPSY  06/14/2020   Procedure: BIOPSY;  Surgeon: Ronnette Juniper, MD;  Location: [facility];  Service: Gastroenterology;;   CARDIAC CATHETERIZATION  09/14/2017   COLONOSCOPY     DENTAL SURGERY Left 10/2016   dental implant   DILATION AND CURETTAGE OF UTERUS  X 4   ESOPHAGOGASTRODUODENOSCOPY     ESOPHAGOGASTRODUODENOSCOPY (EGD) WITH PROPOFOL N/A 06/14/2020   Procedure: ESOPHAGOGASTRODUODENOSCOPY (EGD) WITH PROPOFOL;  Surgeon: Ronnette Juniper, MD;  Location: [facility];  Service: Gastroenterology;  Laterality: N/A;   FLEXIBLE SIGMOIDOSCOPY N/A 06/21/2012   Procedure: FLEXIBLE SIGMOIDOSCOPY;  Surgeon: Jerene Bears, MD;  Location: [facility];  Service: Gastroenterology;  Laterality: N/A;   JOINT REPLACEMENT     LEFT HEART CATH AND CORONARY ANGIOGRAPHY N/A 09/14/2017   Procedure: LEFT HEART CATH AND CORONARY ANGIOGRAPHY;  Surgeon: Belva Crome, MD;  Location: [facility];  Service: Cardiovascular;  Laterality: N/A;   SHOULDER ARTHROSCOPY Right    "shaved spurs off rotator cuff"   TONSILLECTOMY     TOTAL HIP  ARTHROPLASTY Right 06/09/2013   Procedure: TOTAL HIP ARTHROPLASTY;  Surgeon: Kerin Salen, MD;  Location: [facility];  Service: Orthopedics;  Laterality: Right;   TOTAL KNEE ARTHROPLASTY Left 02/07/2019   Procedure: TOTAL KNEE ARTHROPLASTY;  Surgeon: Paralee Cancel, MD;  Location: [facility];  Service: Orthopedics;  Laterality: Left;  70 mins   TUBAL LIGATION     TUMOR EXCISION Right 1968   angle of jaw; benign     OB History     Gravida  2   Para  2   Term      Preterm      AB      Living         SAB      IAB      Ectopic      Multiple      Live Births  Family History  Problem Relation Age of Onset   Drug abuse Mother    Alcohol abuse Mother    Alcohol abuse Father    Hypertension Father    CAD Brother    Hypertension Brother    Alcohol abuse Brother    Hypertension Brother    Hypertension Brother    Psoriasis Daughter    Arthritis Daughter        psoriatic arthritis    Alcohol abuse Grandchild     Social History   Tobacco Use   Smoking status: Never   Smokeless tobacco: Never  Vaping Use   Vaping Use: Never used  Substance Use Topics   Alcohol use: Not Currently    Comment: 09/14/2017 "nothing since 04/15/2008"   Drug use: Not Currently    Frequency: 7.0 times per week    Types: Benzodiazepines    Home Medications Prior to Admission medications   Medication Sig Start Date End Date Taking? Authorizing Provider  ALPRAZolam Duanne Moron) 0.5 MG tablet Take 1 tablet (0.5 mg total) by mouth 4 (four) times daily as needed for anxiety. 09/26/20  Yes [name], PMHNP  atorvastatin (LIPITOR) 40 MG tablet Take 40 mg by mouth every evening.   Yes [provider]  b complex vitamins capsule Take 1 capsule by mouth daily.   Yes [provider]  cholecalciferol (VITAMIN D3) 25 MCG (1000 UNIT) tablet Take 3,000 Units by mouth daily.   Yes [provider]  divalproex (DEPAKOTE) 250 MG DR tablet Take 2 tablets (500 mg total) by  mouth at bedtime. 08/07/20  Yes [name], PMHNP  donepezil (ARICEPT) 10 MG tablet Take 1 tablet (10 mg total) by mouth at bedtime. 08/07/20  Yes [name], PMHNP  gabapentin (NEURONTIN) 600 MG tablet Take 600 mg by mouth 3 (three) times daily. 05/12/19  Yes [provider]  levothyroxine (SYNTHROID) 75 MCG tablet TAKE 1 TABLET BY MOUTH EVERY DAY BEFORE BREAKFAST Patient taking differently: Take 75 mcg by mouth daily before breakfast. 02/22/20  Yes Elayne Snare, MD  lithium carbonate (ESKALITH) 450 MG CR tablet Take 1 tablet (450 mg total) by mouth at bedtime. 08/07/20  Yes [name], PMHNP  losartan (COZAAR) 25 MG tablet Take 25 mg by mouth daily. 08/31/19  Yes [provider]  Melatonin 10 MG TBCR Take 10 mg by mouth at bedtime.   Yes [provider]  memantine (NAMENDA) 10 MG tablet TAKE 1 TABLET(10 MG) BY MOUTH EVERY EVENING 08/07/20  Yes [name], PMHNP  QUEtiapine (SEROQUEL) 200 MG tablet TAKE 1 TAB PO q 5 pm and 1 tab po QHS 08/07/20  Yes [name], PMHNP  rOPINIRole (REQUIP) 0.5 MG tablet Takes 4 tabs at 5 pm and 3 tabs at bedtime and 1-2 tabs prn 08/07/20  Yes [name], PMHNP  Semaglutide, 1 MG/DOSE, (OZEMPIC, 1 MG/DOSE,) 2 MG/1.5ML SOPN Inject 1 mg into the skin once a week. 08/13/20  Yes Elayne Snare, MD  amoxicillin-clavulanate (AUGMENTIN) 875-125 MG tablet Take 1 tablet by mouth every 12 (twelve) hours. Patient not taking: Reported on 09/19/2020 09/06/20   Luna Fuse, MD  BD PEN NEEDLE NANO 2ND GEN 32G X 4 MM MISC USE TO INJECT 5 TIMES DAILY AS DIRECTED 09/20/20   Elayne Snare, MD  glucose blood (CONTOUR NEXT TEST) test strip USE TO TEST BLOOD SUGAR TWICE DAILY 06/20/20   Elayne Snare, MD  insulin glargine (LANTUS SOLOSTAR) 100 UNIT/ML Solostar Pen Inject 64 Units into the skin at bedtime. 08/13/20  Elayne Snare, MD  loperamide (IMODIUM A-D) 2 MG tablet Take 2 mg by mouth 2 (two) times daily as needed for diarrhea or loose stools.    [provider]  Microlet Lancets MISC TEST DAILY AS DIRECTED 06/20/20   Elayne Snare, MD  nitroGLYCERIN (NITROSTAT) 0.4 MG SL tablet Place 1 tablet (0.4 mg total) under the tongue every 5 (five) minutes as needed for chest pain. Patient not taking: No sig reported 09/14/17   Lyda Jester M, PA-C  ondansetron (ZOFRAN) 4 MG tablet Take 4 mg by mouth every 8 (eight) hours as needed for nausea or vomiting. 11/26/19   [provider]  oxyCODONE-acetaminophen (PERCOCET/ROXICET) 5-325 MG tablet Take 1 tablet by mouth every 6 (six) hours as needed for severe pain. 09/16/20   Malvin Johns, MD  Probiotic Product (PROBIOTIC ADVANCED PO) Take by mouth 2 (two) times daily.    [provider]  vancomycin (VANCOCIN) 125 MG capsule Take 125 mg by mouth 4 (four) times daily. 09/16/20   [provider]    Allergies    Codeine, Procaine hcl, Aspirin, Benadryl [diphenhydramine], Diflucan [fluconazole], Dilaudid [hydromorphone hcl], Other, Tramadol hcl, and Sulfa antibiotics  Review of Systems   Review of Systems  Constitutional:  Negative for fever.  Gastrointestinal:  Positive for abdominal pain and anorexia.  All other systems reviewed and are negative.  Physical Exam Updated Vital Signs BP (!) 143/76 (BP Location: Right Arm)   Pulse (!) 107   Temp 97.6 F (36.4 C) (Oral)   Resp 20   Ht '5\' 2"'$  (1.575 m)   Wt 81.2 kg   SpO2 96%   BMI 32.74 kg/m   Physical Exam Vitals and nursing note reviewed.  Constitutional:      General: She is not in acute distress.    Appearance: She is well-developed.  HENT:     Head: Normocephalic and atraumatic.     Mouth/Throat:     Mouth: Mucous membranes are dry.  Eyes:     Pupils: Pupils are equal, round, and reactive to light.  Cardiovascular:     Rate and Rhythm: Regular rhythm. Tachycardia present.     Heart sounds: Normal heart sounds. No murmur heard.   No friction rub.  Pulmonary:     Effort: Pulmonary effort is normal.      Breath sounds: Normal breath sounds. No wheezing or rales.  Abdominal:     General: Abdomen is flat. Bowel sounds are normal. There is no distension.     Palpations: Abdomen is soft.     Tenderness: There is abdominal tenderness in the right lower quadrant, suprapubic area and left lower quadrant. There is no guarding or rebound.  Musculoskeletal:        General: No tenderness. Normal range of motion.     Cervical back: Neck supple.     Right lower leg: No edema.     Left lower leg: No edema.     Comments: No edema  Skin:    General: Skin is warm and dry.     Findings: No rash.  Neurological:     General: No focal deficit present.     Mental Status: She is alert and oriented to person, place, and time. Mental status is at baseline.     Cranial Nerves: No cranial nerve deficit.  Psychiatric:        Mood and Affect: Mood normal.        Behavior: Behavior normal.    ED Results /  Procedures / Treatments   Labs (all labs ordered are listed, but only abnormal results are displayed) Labs Reviewed  CBC WITH DIFFERENTIAL/PLATELET - Abnormal; Notable for the following components:      Result Value   WBC 11.5 (*)    Neutro Abs 8.5 (*)    Abs Immature Granulocytes 0.09 (*)    All other components within normal limits  COMPREHENSIVE METABOLIC PANEL - Abnormal; Notable for the following components:   Glucose, Bld 141 (*)    All other components within normal limits  C DIFFICILE QUICK SCREEN W PCR REFLEX      EKG None  Radiology CT ABDOMEN PELVIS W CONTRAST  Result Date: 10/07/2020 CLINICAL DATA:  Sent from GI for abd CT to r/o colitis, abd pain x 2 days this time with nausea and diarrhea. Frequently has abd problems. Diverticulitis suspected EXAM: CT ABDOMEN AND PELVIS WITH CONTRAST TECHNIQUE: Multidetector CT imaging of the abdomen and pelvis was performed using the standard protocol following bolus administration of intravenous contrast. CONTRAST:  145m OMNIPAQUE IOHEXOL 300 MG/ML   SOLN COMPARISON:  CT abdomen pelvis 09/06/2020, CT abdomen pelvis 05/30/2020, MR abdomen 5 522 FINDINGS: Lower chest: No acute abnormality. Patulous distal esophagus with no definite hiatal hernia. Hepatobiliary: No focal liver abnormality. No gallstones, gallbladder wall thickening, or pericholecystic fluid. No biliary dilatation. Pancreas: No focal lesion. Normal pancreatic contour. No surrounding inflammatory changes. No main pancreatic ductal dilatation. Spleen: Normal in size without focal abnormality. Adrenals/Urinary Tract: No adrenal nodule bilaterally. Right renal cortical scarring. Bilateral kidneys enhance symmetrically. Redemonstration of mild right hydronephrosis with prominence of the right renal pelvis with abrupt change in caliber at the ureteropelvic junction. The renal pelvis does not fully opacify with excreted intravenous contrast on delayed imaging due to timing and therefore cannot be fully evaluated. No hydronephrosis. No hydroureter. The urinary bladder is unremarkable. Please note limited evaluation of distal ureters and urinary bladder due to streak artifact originating from the right surgical femoral hardware. Stomach/Bowel: Stomach is within normal limits. No evidence of small bowel wall thickening or dilatation. Diffuse sigmoid diverticulosis. Mild bowel wall thickening of the cecum and ascending colon likely due to under distension. The appendix is not definitely identified. Vascular/Lymphatic: No abdominal aorta or iliac aneurysm. Mild atherosclerotic plaque of the aorta and its branches. No abdominal, pelvic, or inguinal lymphadenopathy. Reproductive: Status post hysterectomy. No adnexal masses. Other: No intraperitoneal free fluid. No intraperitoneal free gas. No organized fluid collection. Musculoskeletal: Tiny fat containing umbilical hernia. No suspicious lytic or blastic osseous lesions. No acute displaced fracture. Total right hip arthroplasty partially visualized. IMPRESSION:  1. Diffuse sigmoid diverticulosis with no acute diverticulitis. 2. Chronic mild right hydronephrosis and right pelviectasis with abrupt change in caliber at the ureteropelvic junction. The renal pelvis does not fully opacify with excreted intravenous contrast on delayed imaging due to timing and therefore cannot be fully evaluated. Associated right renal cortical scarring. If not already previously obtained, consider nonemergent urologic consultation. 3. Tiny fat containing umbilical hernia with no findings of associated ischemia or bowel obstruction. Electronically Signed   By: MIven FinnM.D.   On: 10/07/2020 20:05    Procedures Procedures   Medications Ordered in ED Medications  ondansetron (ZOFRAN) injection 4 mg (has no administration in time range)  fentaNYL (SUBLIMAZE) injection 50 mcg (has no administration in time range)  lactated ringers bolus 1,000 mL (has no administration in time range)    ED Course  I have reviewed the triage vital signs and the  nursing notes.  Pertinent labs & imaging results that were available during my care of the patient were reviewed by me and considered in my medical decision making (see chart for details).    MDM Rules/Calculators/A&P                           Elderly female with recurrent abdominal pain today in the setting of recent diverticulitis x2 and then C. difficile all in the last month.  Patient symptoms started 2 days ago.  She was seen by GI today who sent her here for further evaluation.  She has diffuse lower pain, mild tachycardia but no peritoneal signs.  More than 10 episodes of diarrhea in the last 24 hours.  Denies any blood in the stool.  Concern for recurrent diverticulitis versus recurrent C. difficile.  Labs and imaging are pending.  Patient did have a negative C. difficile test last week.  Will give IV fluids due to concern for dehydration.  Patient given pain and nausea control.  9:13 PM Patient's labs are reassuring with  leukocytosis of 11,000 but otherwise normal.  CT shows diffuse sigmoid diverticulosis but no diverticulitis at this time.  She has chronic mild right hydronephrosis and pelviectasis without acute changes and no evidence of bowel ischemia.  On repeat evaluation patient has still had multiple episodes of diarrhea since being here.  Concern for recurrent C. difficile.  We will get a stool sample here and give patient 1 more dose of pain medication and try p.o. challenge.    11:28 PM Patient continues to feel badly here.  She had several episodes of diarrhea stool earlier but they were not collected.  She has not been able to eat hardly anything and continues to have abdominal pain.  This would be the fourth time she is been in the hospital for similar symptoms in the last month.  She saw Dr. Therisa Doyne today who recommended she get admitted for further treatment.  Patient started on maintenance fluids.    MDM   Amount and/or Complexity of Data Reviewed Clinical lab tests: ordered and reviewed Tests in the radiology section of CPT: reviewed and ordered Independent visualization of images, tracings, or specimens: yes     Final Clinical Impression(s) / ED Diagnoses Final diagnoses:  Diarrhea, unspecified type    Rx / DC Orders ED Discharge Orders     None        Blanchie Dessert, MD 10/07/20 2340

## 2020-10-07 NOTE — ED Notes (Signed)
Pt ambulated self to bathroom.

## 2020-10-07 NOTE — ED Notes (Signed)
88-90% after giving fentnyl. Placed on 2.5 L now up to 95-98%

## 2020-10-07 NOTE — ED Notes (Signed)
RT Note: RN notified they had placed pt.on 2.5 lpm n/c Oxygen.

## 2020-10-07 NOTE — ED Triage Notes (Signed)
Sent from GI for abd CT to r/o colitis, abd pain x 2 days this time with nausea and diarrhea. Frequently has abd problems.

## 2020-10-08 ENCOUNTER — Other Ambulatory Visit: Payer: Medicare Other

## 2020-10-08 DIAGNOSIS — K5730 Diverticulosis of large intestine without perforation or abscess without bleeding: Secondary | ICD-10-CM | POA: Diagnosis present

## 2020-10-08 DIAGNOSIS — H9193 Unspecified hearing loss, bilateral: Secondary | ICD-10-CM | POA: Diagnosis present

## 2020-10-08 DIAGNOSIS — E1169 Type 2 diabetes mellitus with other specified complication: Secondary | ICD-10-CM | POA: Diagnosis not present

## 2020-10-08 DIAGNOSIS — K580 Irritable bowel syndrome with diarrhea: Secondary | ICD-10-CM | POA: Diagnosis present

## 2020-10-08 DIAGNOSIS — R197 Diarrhea, unspecified: Secondary | ICD-10-CM | POA: Diagnosis present

## 2020-10-08 DIAGNOSIS — G2581 Restless legs syndrome: Secondary | ICD-10-CM | POA: Diagnosis present

## 2020-10-08 DIAGNOSIS — Z9851 Tubal ligation status: Secondary | ICD-10-CM | POA: Diagnosis not present

## 2020-10-08 DIAGNOSIS — M47812 Spondylosis without myelopathy or radiculopathy, cervical region: Secondary | ICD-10-CM | POA: Diagnosis present

## 2020-10-08 DIAGNOSIS — Z96641 Presence of right artificial hip joint: Secondary | ICD-10-CM | POA: Diagnosis present

## 2020-10-08 DIAGNOSIS — M797 Fibromyalgia: Secondary | ICD-10-CM | POA: Diagnosis present

## 2020-10-08 DIAGNOSIS — E669 Obesity, unspecified: Secondary | ICD-10-CM | POA: Diagnosis not present

## 2020-10-08 DIAGNOSIS — F419 Anxiety disorder, unspecified: Secondary | ICD-10-CM | POA: Diagnosis present

## 2020-10-08 DIAGNOSIS — E785 Hyperlipidemia, unspecified: Secondary | ICD-10-CM | POA: Diagnosis present

## 2020-10-08 DIAGNOSIS — Z96652 Presence of left artificial knee joint: Secondary | ICD-10-CM | POA: Diagnosis present

## 2020-10-08 DIAGNOSIS — F319 Bipolar disorder, unspecified: Secondary | ICD-10-CM

## 2020-10-08 DIAGNOSIS — F32A Depression, unspecified: Secondary | ICD-10-CM | POA: Diagnosis not present

## 2020-10-08 DIAGNOSIS — M19042 Primary osteoarthritis, left hand: Secondary | ICD-10-CM | POA: Diagnosis present

## 2020-10-08 DIAGNOSIS — K3184 Gastroparesis: Secondary | ICD-10-CM | POA: Diagnosis present

## 2020-10-08 DIAGNOSIS — N1330 Unspecified hydronephrosis: Secondary | ICD-10-CM | POA: Diagnosis present

## 2020-10-08 DIAGNOSIS — I10 Essential (primary) hypertension: Secondary | ICD-10-CM | POA: Diagnosis present

## 2020-10-08 DIAGNOSIS — E1143 Type 2 diabetes mellitus with diabetic autonomic (poly)neuropathy: Secondary | ICD-10-CM | POA: Diagnosis present

## 2020-10-08 DIAGNOSIS — N2889 Other specified disorders of kidney and ureter: Secondary | ICD-10-CM | POA: Diagnosis present

## 2020-10-08 DIAGNOSIS — Z8249 Family history of ischemic heart disease and other diseases of the circulatory system: Secondary | ICD-10-CM | POA: Diagnosis not present

## 2020-10-08 DIAGNOSIS — E039 Hypothyroidism, unspecified: Secondary | ICD-10-CM | POA: Diagnosis present

## 2020-10-08 DIAGNOSIS — L930 Discoid lupus erythematosus: Secondary | ICD-10-CM | POA: Diagnosis present

## 2020-10-08 DIAGNOSIS — G8929 Other chronic pain: Secondary | ICD-10-CM | POA: Diagnosis present

## 2020-10-08 DIAGNOSIS — Z20822 Contact with and (suspected) exposure to covid-19: Secondary | ICD-10-CM | POA: Diagnosis present

## 2020-10-08 DIAGNOSIS — M19041 Primary osteoarthritis, right hand: Secondary | ICD-10-CM | POA: Diagnosis present

## 2020-10-08 LAB — C DIFFICILE QUICK SCREEN W PCR REFLEX
C Diff antigen: NEGATIVE
C Diff interpretation: NOT DETECTED
C Diff toxin: NEGATIVE

## 2020-10-08 LAB — GLUCOSE, CAPILLARY
Glucose-Capillary: 129 mg/dL — ABNORMAL HIGH (ref 70–99)
Glucose-Capillary: 116 mg/dL — ABNORMAL HIGH (ref 70–99)
Glucose-Capillary: 120 mg/dL — ABNORMAL HIGH (ref 70–99)

## 2020-10-08 LAB — RESP PANEL BY RT-PCR (FLU A&B, COVID) ARPGX2
Influenza A by PCR: NEGATIVE
Influenza B by PCR: NEGATIVE
SARS Coronavirus 2 by RT PCR: NEGATIVE

## 2020-10-08 LAB — HEMOGLOBIN A1C
Hgb A1c MFr Bld: 6.6 % — ABNORMAL HIGH (ref 4.8–5.6)
Mean Plasma Glucose: 142.72 mg/dL

## 2020-10-08 LAB — TSH: TSH: 1.488 u[IU]/mL (ref 0.350–4.500)

## 2020-10-08 LAB — LITHIUM LEVEL: Lithium Lvl: 0.19 mmol/L — ABNORMAL LOW (ref 0.60–1.20)

## 2020-10-08 LAB — MRSA NEXT GEN BY PCR, NASAL: MRSA by PCR Next Gen: NOT DETECTED

## 2020-10-08 MED ORDER — ONDANSETRON HCL 4 MG/2ML IJ SOLN: 4.0000 mg | Freq: Four times a day (QID) | INTRAMUSCULAR | Status: DC | PRN

## 2020-10-08 MED ORDER — INSULIN ASPART 100 UNIT/ML IJ SOLN: 0.0000 [IU] | Freq: Every day | INTRAMUSCULAR | Status: DC

## 2020-10-08 MED ORDER — INSULIN ASPART 100 UNIT/ML IJ SOLN
0.0000 [IU] | Freq: Three times a day (TID) | INTRAMUSCULAR | Status: DC
  Administered 2020-10-08 – 2020-10-09 (×2): 2 [IU] via SUBCUTANEOUS

## 2020-10-08 MED ORDER — LEVOTHYROXINE SODIUM 75 MCG PO TABS
75.0000 ug | ORAL_TABLET | Freq: Every day | ORAL | Status: DC
  Administered 2020-10-09: 75 ug via ORAL
  Filled 2020-10-08: qty 1

## 2020-10-08 MED ORDER — GABAPENTIN 300 MG PO CAPS
600.0000 mg | ORAL_CAPSULE | Freq: Three times a day (TID) | ORAL | Status: DC
  Administered 2020-10-08 – 2020-10-09 (×5): 600 mg via ORAL
  Filled 2020-10-08 (×5): qty 2

## 2020-10-08 MED ORDER — ATORVASTATIN CALCIUM 40 MG PO TABS
40.0000 mg | ORAL_TABLET | Freq: Every evening | ORAL | Status: DC
  Administered 2020-10-08: 40 mg via ORAL
  Filled 2020-10-08: qty 1

## 2020-10-08 MED ORDER — MEMANTINE HCL 10 MG PO TABS
10.0000 mg | ORAL_TABLET | Freq: Every day | ORAL | Status: DC
  Administered 2020-10-08 – 2020-10-09 (×2): 10 mg via ORAL
  Filled 2020-10-08 (×3): qty 1

## 2020-10-08 MED ORDER — DIVALPROEX SODIUM 250 MG PO DR TAB
500.0000 mg | DELAYED_RELEASE_TABLET | Freq: Every day | ORAL | Status: DC
  Administered 2020-10-08 (×2): 500 mg via ORAL
  Filled 2020-10-08 (×2): qty 2

## 2020-10-08 MED ORDER — ENOXAPARIN SODIUM 40 MG/0.4ML IJ SOSY
40.0000 mg | PREFILLED_SYRINGE | INTRAMUSCULAR | Status: DC
  Administered 2020-10-08 – 2020-10-09 (×2): 40 mg via SUBCUTANEOUS
  Filled 2020-10-08 (×2): qty 0.4

## 2020-10-08 MED ORDER — FENTANYL CITRATE (PF) 100 MCG/2ML IJ SOLN
100.0000 ug | Freq: Once | INTRAMUSCULAR | Status: AC
  Administered 2020-10-08: 100 ug via INTRAVENOUS
  Filled 2020-10-08: qty 2

## 2020-10-08 MED ORDER — SODIUM CHLORIDE 0.9% FLUSH
3.0000 mL | Freq: Two times a day (BID) | INTRAVENOUS | Status: DC
  Administered 2020-10-08 (×2): 3 mL via INTRAVENOUS

## 2020-10-08 MED ORDER — QUETIAPINE FUMARATE 100 MG PO TABS
200.0000 mg | ORAL_TABLET | Freq: Every day | ORAL | Status: DC
  Filled 2020-10-08: qty 2

## 2020-10-08 MED ORDER — ONDANSETRON HCL 4 MG PO TABS: 4.0000 mg | ORAL_TABLET | Freq: Four times a day (QID) | ORAL | Status: DC | PRN

## 2020-10-08 MED ORDER — ALPRAZOLAM 0.5 MG PO TABS
0.5000 mg | ORAL_TABLET | Freq: Four times a day (QID) | ORAL | Status: DC | PRN
  Administered 2020-10-08 – 2020-10-09 (×3): 0.5 mg via ORAL
  Filled 2020-10-08 (×3): qty 1

## 2020-10-08 MED ORDER — ACETAMINOPHEN 650 MG RE SUPP: 650.0000 mg | Freq: Four times a day (QID) | RECTAL | Status: DC | PRN

## 2020-10-08 MED ORDER — ACETAMINOPHEN 325 MG PO TABS
650.0000 mg | ORAL_TABLET | Freq: Four times a day (QID) | ORAL | Status: DC | PRN
  Administered 2020-10-08: 650 mg via ORAL
  Filled 2020-10-08: qty 2

## 2020-10-08 MED ORDER — DONEPEZIL HCL 10 MG PO TABS
10.0000 mg | ORAL_TABLET | Freq: Every day | ORAL | Status: DC
  Administered 2020-10-08: 10 mg via ORAL
  Filled 2020-10-08 (×2): qty 1

## 2020-10-08 MED ORDER — LITHIUM CARBONATE 300 MG PO CAPS
450.0000 mg | ORAL_CAPSULE | Freq: Every day | ORAL | Status: DC
  Administered 2020-10-08: 450 mg via ORAL
  Filled 2020-10-08 (×2): qty 1

## 2020-10-08 NOTE — Assessment & Plan Note (Signed)
-   BP stable currently - Follow-up med rec

## 2020-10-08 NOTE — Assessment & Plan Note (Signed)
Check TSH -Continue home Synthroid 

## 2020-10-08 NOTE — ED Notes (Signed)
Report given to [facility] 

## 2020-10-08 NOTE — Assessment & Plan Note (Signed)
-   Differential includes recurrent C. Difficile vs functional diarrhea from recovering from recent illness/infection - Follow-up repeat C. difficile testing with PCR - If repeat testing negative, will order GI pathogen panel; if also negative, may use Imodium as needed -Continue soft diet/carb consistent

## 2020-10-08 NOTE — Assessment & Plan Note (Addendum)
-   Continue Xanax PRN for now (database reviewed, LF 6/23 #120 for 30 day supply) -Follow-up med rec and resume home meds as able

## 2020-10-08 NOTE — Plan of Care (Signed)
[facility] will assume care on arrival to accepting facility. Until arrival, care as per EDP. However, [facility] available 24/7 for questions and assistance.  Nursing staff please page [facility] Admits and Consults ([phone]) as soon as the patient arrives the hospital.   

## 2020-10-08 NOTE — ED Notes (Signed)
Report given to Nigel Mormon RN - [facility] [phone]

## 2020-10-08 NOTE — ED Provider Notes (Signed)
Report was called to Dr. Marlowe Sax  for hospitalist admission.   Ripley Fraise, MD 10/08/20 [phone]

## 2020-10-08 NOTE — ED Notes (Signed)
Pt has been transferred to [facility] via [facility] -  Pt is stable

## 2020-10-08 NOTE — Assessment & Plan Note (Signed)
-   SSI and CBG monitoring

## 2020-10-08 NOTE — Assessment & Plan Note (Signed)
-   Continue BuSpar, Cymbalta 

## 2020-10-08 NOTE — Hospital Course (Addendum)
[name] [name] is a 75 yo female with PMH diverticulosis, bipolar 1, anxiety/depression, gastroparesis, hyperlipidemia, hypertension, hypothyroidism, IBS, osteoarthritis, RLS, DM II who presented to the hospital with recurrent diarrhea and associated abdominal discomfort/cramping. She has been on 2 courses of Augmentin outpatient over the past 1 month for recent diverticulitis.  Each course of Augmentin was 10 days.  She developed C. difficile after antibiotic courses and was treated with 10 days of vancomycin which she completed on 09/28/2020.  She says she then provided a stool sample on 09/30/20.  She says this came back negative on 10/03/2020 when tested for C. difficile.  Regardless, she states that she then started developing recurrent diarrhea at home (~ 2 days) and presented for further work-up and evaluation. C. difficile testing was again sent off on admission. Vitals on admission included: Temp 97.6, HR 107, RR 20, BP 143/76, 96% on RA. Notable labs included: WBC 11.5, platelets 233.  Chemistries essentially unremarkable. Due to abdominal discomfort, she underwent CT abdomen/pelvis.  This showed underlying sigmoid diverticulosis without evidence of diverticulitis.  Chronic right hydronephrosis and right pelviectasis.  She denied fevers, chills, sweats.  Endorsed abdominal discomfort/cramping and diarrhea.

## 2020-10-08 NOTE — Plan of Care (Signed)
  Problem: Health Behavior/Discharge Planning: Goal: Ability to manage health-related needs will improve Outcome: Progressing   Problem: Education: Goal: Knowledge of General Education information will improve Description: Including pain rating scale, medication(s)/side effects and non-pharmacologic comfort measures Outcome: Progressing   Problem: Clinical Measurements: Goal: Ability to maintain clinical measurements within normal limits will improve Outcome: Progressing Goal: Will remain free from infection Outcome: Progressing Goal: Diagnostic test results will improve Outcome: Progressing Goal: Respiratory complications will improve Outcome: Progressing Goal: Cardiovascular complication will be avoided Outcome: Progressing   Problem: Nutrition: Goal: Adequate nutrition will be maintained Outcome: Progressing   Problem: Elimination: Goal: Will not experience complications related to bowel motility Outcome: Progressing   Problem: Pain Managment: Goal: General experience of comfort will improve Outcome: Progressing   Problem: Skin Integrity: Goal: Risk for impaired skin integrity will decrease Outcome: Progressing

## 2020-10-08 NOTE — H&P (Signed)
History and Physical    [name] [name]  [id number]  DOB: 03/22/1945  DOA: 10/07/2020  PCP: London Pepper, MD Patient coming from: home  Chief Complaint: diarrhea   HPI:  AndMs. Dose is a 75 yo female with PMH diverticulosis, bipolar 1, anxiety/depression, gastroparesis, hyperlipidemia, hypertension, hypothyroidism, IBS, osteoarthritis, RLS, DM II who presented to the hospital with recurrent diarrhea and associated abdominal discomfort/cramping. She has been on 2 courses of Augmentin outpatient over the past 1 month for recent diverticulitis.  Each course of Augmentin was 10 days.  She developed C. difficile after antibiotic courses and was treated with 10 days of vancomycin which she completed on 09/28/2020.  She says she then provided a stool sample on 09/30/20.  She says this came back negative on 10/03/2020 when tested for C. difficile.  Regardless, she states that she then started developing recurrent diarrhea at home (~ 2 days) and presented for further work-up and evaluation. C. difficile testing was again sent off on admission. Vitals on admission included: Temp 97.6, HR 107, RR 20, BP 143/76, 96% on RA. Notable labs included: WBC 11.5, platelets 233.  Chemistries essentially unremarkable. Due to abdominal discomfort, she underwent CT abdomen/pelvis.  This showed underlying sigmoid diverticulosis without evidence of diverticulitis.  Chronic right hydronephrosis and right pelviectasis.  She denied fevers, chills, sweats.  Endorsed abdominal discomfort/cramping and diarrhea.  I have personally briefly reviewed patient's old medical records in [facility] and discussed patient with the ER provider when appropriate/indicated.  Assessment/Plan: * Diarrhea - Differential includes recurrent C. Difficile vs functional diarrhea from recovering from recent illness/infection - Follow-up repeat C. difficile testing with PCR - If repeat testing negative, will order GI pathogen panel;  if also negative, may use Imodium as needed -Continue soft diet/carb consistent  Essential hypertension - BP stable currently - Follow-up med rec  Diabetes mellitus type 2 in obese ([place]) - SSI and CBG monitoring  Hypothyroidism - Check TSH - Continue home Synthroid  Bipolar 1 disorder ([place]) - Resume home meds after med rec complete  Anxiety and depression - Continue Xanax PRN for now (database reviewed, LF 6/23 #120 for 30 day supply) -Follow-up med rec and resume home meds as able    Code Status:     Code Status: Full Code  DVT Prophylaxis:   enoxaparin (LOVENOX) injection 40 mg Start: 10/08/20 1200   Anticipated disposition is to: home  History: Past Medical History:  Diagnosis Date   Alcohol abuse    Anxiety    takes Valium daily as needed and Ativan daily   Bilateral hearing loss    Bipolar 1 disorder ([place])    takes Lithium nightly and Synthroid daily   Chronic back pain    DDD; "all over" (09/14/2017)   Colitis, ischemic ([place]) 2012   Confusion    r/t meds   Depression    takes Prozac daily and Bupspirone    Diverticulosis    Dyslipidemia    takes Crestor daily   Fibromyalgia    Gastroparesis    Headache    "weekly" (09/14/2017)   Hepatic steatosis 06/18/12   severe   Hyperlipidemia    Hypertension    Hypothyroidism    IBS (irritable bowel syndrome)    Ischemic colitis ([place])    Joint pain    Joint swelling    Lupus erythematosus tumidus    tumid-skin   Migraine    "1-2/yr; maybe" (09/14/2017)   Numbness    in right foot  Osteoarthritis    "all over" (09/14/2017)   Osteoarthritis cervical spine    Osteoarthritis of hand    bilateral   Pneumonia    "walking pneumonia several times; long time since the last time" (09/14/2017)   Restless leg syndrome    takes Requip nightly   Sciatica    Type II diabetes mellitus ([place])    Urinary frequency    Urinary leakage    Urinary urgency    Urinary, incontinence, stress female    Walking pneumonia    last  time more than 65yr ago    Past Surgical History:  Procedure Laterality Date   ABDOMINAL HYSTERECTOMY     "they left my ovaries"   APPENDECTOMY     BALLOON DILATION N/A 06/14/2020   Procedure: BALLOON DILATION;  Surgeon: KRonnette Juniper MD;  Location: [facility];  Service: Gastroenterology;  Laterality: N/A;   BIOPSY  06/14/2020   Procedure: BIOPSY;  Surgeon: KRonnette Juniper MD;  Location: [facility];  Service: Gastroenterology;;   CARDIAC CATHETERIZATION  09/14/2017   COLONOSCOPY     DENTAL SURGERY Left 10/2016   dental implant   DILATION AND CURETTAGE OF UTERUS  X 4   ESOPHAGOGASTRODUODENOSCOPY     ESOPHAGOGASTRODUODENOSCOPY (EGD) WITH PROPOFOL N/A 06/14/2020   Procedure: ESOPHAGOGASTRODUODENOSCOPY (EGD) WITH PROPOFOL;  Surgeon: KRonnette Juniper MD;  Location: [facility];  Service: Gastroenterology;  Laterality: N/A;   FLEXIBLE SIGMOIDOSCOPY N/A 06/21/2012   Procedure: FLEXIBLE SIGMOIDOSCOPY;  Surgeon: JJerene Bears MD;  [facility]: [facility];  Service: Gastroenterology;  Laterality: N/A;   JOINT REPLACEMENT     LEFT HEART CATH AND CORONARY ANGIOGRAPHY N/A 09/14/2017   Procedure: LEFT HEART CATH AND CORONARY ANGIOGRAPHY;  Surgeon: SBelva Crome MD;  [facility];  Service: Cardiovascular;  Laterality: N/A;   SHOULDER ARTHROSCOPY Right    "shaved spurs off rotator cuff"   TONSILLECTOMY     TOTAL HIP ARTHROPLASTY Right 06/09/2013   Procedure: TOTAL HIP ARTHROPLASTY;  Surgeon: FKerin Salen MD;  [facility]  Service: Orthopedics;  Laterality: Right;   TOTAL KNEE ARTHROPLASTY Left 02/07/2019   Procedure: TOTAL KNEE ARTHROPLASTY;  Surgeon: OParalee Cancel MD;  Location: [facility];  Service: Orthopedics;  Laterality: Left;  70 mins   TUBAL LIGATION     TUMOR EXCISION Right 1968   angle of jaw; benign     reports that she has never smoked. She has never used smokeless tobacco. She reports previous alcohol use. She reports previous drug use. Frequency: 7.00 times per week. Drug:  Benzodiazepines.  Allergies  Allergen Reactions   Codeine Anxiety and Other (See Comments)    Hallucinations, tolerates oxycodone    Procaine Hcl Palpitations   Aspirin Nausea And Vomiting and Other (See Comments)    Reaction:  Burns pts stomach    Benadryl [Diphenhydramine] Other (See Comments)    Per MD "inhibits potency of gabapentin, lithium etc"   Diflucan [Fluconazole] Other (See Comments)    Unknown reaction    Dilaudid [Hydromorphone Hcl] Other (See Comments)    Migraines and nightmares    Other Nausea And Vomiting and Other (See Comments)    Pt states that all -mycins cause N/V. (MACROLIDES)    Tramadol Hcl     Other reaction(s): felt weird   Sulfa Antibiotics Other (See Comments)    Unknown reaction    Family History  Problem Relation Age of Onset   Drug abuse Mother    Alcohol abuse Mother    Alcohol  abuse Father    Hypertension Father    CAD Brother    Hypertension Brother    Alcohol abuse Brother    Hypertension Brother    Hypertension Brother    Psoriasis Daughter    Arthritis Daughter        psoriatic arthritis    Alcohol abuse Grandchild    Home Medications: Prior to Admission medications   Medication Sig Start Date End Date Taking? Authorizing Provider  ALPRAZolam Duanne Moron) 0.5 MG tablet Take 1 tablet (0.5 mg total) by mouth 4 (four) times daily as needed for anxiety. 09/26/20  Yes [name], PMHNP  atorvastatin (LIPITOR) 40 MG tablet Take 40 mg by mouth every evening.   Yes [provider]  b complex vitamins capsule Take 1 capsule by mouth daily.   Yes [provider]  cholecalciferol (VITAMIN D3) 25 MCG (1000 UNIT) tablet Take 3,000 Units by mouth daily.   Yes [provider]  divalproex (DEPAKOTE) 250 MG DR tablet Take 2 tablets (500 mg total) by mouth at bedtime. 08/07/20  Yes [name], PMHNP  donepezil (ARICEPT) 10 MG tablet Take 1 tablet (10 mg total) by mouth at bedtime. 08/07/20  Yes [name], PMHNP   gabapentin (NEURONTIN) 600 MG tablet Take 600 mg by mouth 3 (three) times daily. 05/12/19  Yes [provider]  levothyroxine (SYNTHROID) 75 MCG tablet TAKE 1 TABLET BY MOUTH EVERY DAY BEFORE BREAKFAST Patient taking differently: Take 75 mcg by mouth daily before breakfast. 02/22/20  Yes Elayne Snare, MD  lithium carbonate (ESKALITH) 450 MG CR tablet Take 1 tablet (450 mg total) by mouth at bedtime. 08/07/20  Yes [name], PMHNP  losartan (COZAAR) 25 MG tablet Take 25 mg by mouth daily. 08/31/19  Yes [provider]  Melatonin 10 MG TBCR Take 10 mg by mouth at bedtime.   Yes [provider]  memantine (NAMENDA) 10 MG tablet TAKE 1 TABLET(10 MG) BY MOUTH EVERY EVENING 08/07/20  Yes [name], PMHNP  QUEtiapine (SEROQUEL) 200 MG tablet TAKE 1 TAB PO q 5 pm and 1 tab po QHS 08/07/20  Yes [name], PMHNP  rOPINIRole (REQUIP) 0.5 MG tablet Takes 4 tabs at 5 pm and 3 tabs at bedtime and 1-2 tabs prn 08/07/20  Yes [name], PMHNP  Semaglutide, 1 MG/DOSE, (OZEMPIC, 1 MG/DOSE,) 2 MG/1.5ML SOPN Inject 1 mg into the skin once a week. 08/13/20  Yes Elayne Snare, MD  amoxicillin-clavulanate (AUGMENTIN) 875-125 MG tablet Take 1 tablet by mouth every 12 (twelve) hours. Patient not taking: Reported on 09/19/2020 09/06/20   Luna Fuse, MD  BD PEN NEEDLE NANO 2ND GEN 32G X 4 MM MISC USE TO INJECT 5 TIMES DAILY AS DIRECTED 09/20/20   Elayne Snare, MD  glucose blood (CONTOUR NEXT TEST) test strip USE TO TEST BLOOD SUGAR TWICE DAILY 06/20/20   Elayne Snare, MD  insulin glargine (LANTUS SOLOSTAR) 100 UNIT/ML Solostar Pen Inject 64 Units into the skin at bedtime. 08/13/20   Elayne Snare, MD  loperamide (IMODIUM A-D) 2 MG tablet Take 2 mg by mouth 2 (two) times daily as needed for diarrhea or loose stools.    [provider]  Microlet Lancets MISC TEST DAILY AS DIRECTED 06/20/20   Elayne Snare, MD  nitroGLYCERIN (NITROSTAT) 0.4 MG SL tablet Place 1 tablet (0.4 mg total) under the  tongue every 5 (five) minutes as needed for chest pain. Patient not taking: No sig reported 09/14/17   Lyda Jester M, PA-C  ondansetron (  ZOFRAN) 4 MG tablet Take 4 mg by mouth every 8 (eight) hours as needed for nausea or vomiting. 11/26/19   [provider]  oxyCODONE-acetaminophen (PERCOCET/ROXICET) 5-325 MG tablet Take 1 tablet by mouth every 6 (six) hours as needed for severe pain. 09/16/20   Malvin Johns, MD  Probiotic Product (PROBIOTIC ADVANCED PO) Take by mouth 2 (two) times daily.    [provider]  vancomycin (VANCOCIN) 125 MG capsule Take 125 mg by mouth 4 (four) times daily. 09/16/20   [provider]    Review of Systems:  Pertinent items noted in HPI and remainder of comprehensive ROS otherwise negative.  Physical Exam: Vitals:   10/08/20 0200 10/08/20 0322 10/08/20 0621 10/08/20 0801  BP: (!) 153/76 134/68 135/75 132/65  Pulse: 75 61 62 77  Resp: '18 20 18 18  '$ Temp:   97.8 F (36.6 C) 98.6 F (37 C)  TempSrc:   Oral Oral  SpO2: 97% 95% 96% 97%  Weight:   83.3 kg   Height:   '5\' 2"'$  (1.575 m)    General appearance: alert, cooperative, and no distress Head: Normocephalic, without obvious abnormality, atraumatic Eyes:  EOMI Lungs: clear to auscultation bilaterally Heart: regular rate and rhythm and S1, S2 normal Abdomen:  Slightly hypoactive bowel sounds.  Soft, nontender, nondistended Extremities:  No edema Skin: mobility and turgor normal Neurologic: Grossly normal  Labs on Admission:  I have personally reviewed following labs and imaging studies Results for orders placed or performed during the hospital encounter of 10/07/20 (from the past 24 hour(s))  CBC with Differential/Platelet     Status: Abnormal   Collection Time: 10/07/20  5:56 PM  Result Value Ref Range   WBC 11.5 (H) 4.0 - 10.5 K/uL   RBC 4.92 3.87 - 5.11 MIL/uL   Hemoglobin 14.5 12.0 - 15.0 g/dL   HCT 45.1 36.0 - 46.0 %   MCV 91.7 80.0 - 100.0 fL   MCH 29.5 26.0 -  34.0 pg   MCHC 32.2 30.0 - 36.0 g/dL   RDW 13.5 11.5 - 15.5 %   Platelets 233 150 - 400 K/uL   nRBC 0.0 0.0 - 0.2 %   Neutrophils Relative % 74 %   Neutro Abs 8.5 (H) 1.7 - 7.7 K/uL   Lymphocytes Relative 19 %   Lymphs Abs 2.1 0.7 - 4.0 K/uL   Monocytes Relative 5 %   Monocytes Absolute 0.6 0.1 - 1.0 K/uL   Eosinophils Relative 1 %   Eosinophils Absolute 0.1 0.0 - 0.5 K/uL   Basophils Relative 0 %   Basophils Absolute 0.0 0.0 - 0.1 K/uL   Immature Granulocytes 1 %   Abs Immature Granulocytes 0.09 (H) 0.00 - 0.07 K/uL  Comprehensive metabolic panel     Status: Abnormal   Collection Time: 10/07/20  5:56 PM  Result Value Ref Range   Sodium 139 135 - 145 mmol/L   Potassium 3.9 3.5 - 5.1 mmol/L   Chloride 103 98 - 111 mmol/L   CO2 26 22 - 32 mmol/L   Glucose, Bld 141 (H) 70 - 99 mg/dL   BUN 13 8 - 23 mg/dL   Creatinine, Ser 0.81 0.44 - 1.00 mg/dL   Calcium 10.3 8.9 - 10.3 mg/dL   Total Protein 7.2 6.5 - 8.1 g/dL   Albumin 4.4 3.5 - 5.0 g/dL   AST 15 15 - 41 U/L   ALT 15 0 - 44 U/L   Alkaline Phosphatase 91 38 - 126 U/L  Total Bilirubin 0.5 0.3 - 1.2 mg/dL   GFR, Estimated >60 >60 mL/min   Anion gap 10 5 - 15  Resp Panel by RT-PCR (Flu A&B, Covid) Nasopharyngeal Swab     Status: None   Collection Time: 10/07/20 11:57 PM   Specimen: Nasopharyngeal Swab; Nasopharyngeal(NP) swabs in vial transport medium  Result Value Ref Range   SARS Coronavirus 2 by RT PCR NEGATIVE NEGATIVE   Influenza A by PCR NEGATIVE NEGATIVE   Influenza B by PCR NEGATIVE NEGATIVE  Lithium level     Status: Abnormal   Collection Time: 10/08/20 12:27 AM  Result Value Ref Range   Lithium Lvl 0.19 (L) 0.60 - 1.20 mmol/L  C Difficile Quick Screen w PCR reflex     Status: None   Collection Time: 10/08/20  2:15 AM   Specimen: STOOL  Result Value Ref Range   C Diff antigen NEGATIVE NEGATIVE   C Diff toxin NEGATIVE NEGATIVE   C Diff interpretation No C. difficile detected.      Radiological Exams on  Admission: CT ABDOMEN PELVIS W CONTRAST  Result Date: 10/07/2020 CLINICAL DATA:  Sent from GI for abd CT to r/o colitis, abd pain x 2 days this time with nausea and diarrhea. Frequently has abd problems. Diverticulitis suspected EXAM: CT ABDOMEN AND PELVIS WITH CONTRAST TECHNIQUE: Multidetector CT imaging of the abdomen and pelvis was performed using the standard protocol following bolus administration of intravenous contrast. CONTRAST:  191m OMNIPAQUE IOHEXOL 300 MG/ML  SOLN COMPARISON:  CT abdomen pelvis 09/06/2020, CT abdomen pelvis 05/30/2020, MR abdomen [id number] FINDINGS: Lower chest: No acute abnormality. Patulous distal esophagus with no definite hiatal hernia. Hepatobiliary: No focal liver abnormality. No gallstones, gallbladder wall thickening, or pericholecystic fluid. No biliary dilatation. Pancreas: No focal lesion. Normal pancreatic contour. No surrounding inflammatory changes. No main pancreatic ductal dilatation. Spleen: Normal in size without focal abnormality. Adrenals/Urinary Tract: No adrenal nodule bilaterally. Right renal cortical scarring. Bilateral kidneys enhance symmetrically. Redemonstration of mild right hydronephrosis with prominence of the right renal pelvis with abrupt change in caliber at the ureteropelvic junction. The renal pelvis does not fully opacify with excreted intravenous contrast on delayed imaging due to timing and therefore cannot be fully evaluated. No hydronephrosis. No hydroureter. The urinary bladder is unremarkable. Please note limited evaluation of distal ureters and urinary bladder due to streak artifact originating from the right surgical femoral hardware. Stomach/Bowel: Stomach is within normal limits. No evidence of small bowel wall thickening or dilatation. Diffuse sigmoid diverticulosis. Mild bowel wall thickening of the cecum and ascending colon likely due to under distension. The appendix is not definitely identified. Vascular/Lymphatic: No abdominal aorta or  iliac aneurysm. Mild atherosclerotic plaque of the aorta and its branches. No abdominal, pelvic, or inguinal lymphadenopathy. Reproductive: Status post hysterectomy. No adnexal masses. Other: No intraperitoneal free fluid. No intraperitoneal free gas. No organized fluid collection. Musculoskeletal: Tiny fat containing umbilical hernia. No suspicious lytic or blastic osseous lesions. No acute displaced fracture. Total right hip arthroplasty partially visualized. IMPRESSION: 1. Diffuse sigmoid diverticulosis with no acute diverticulitis. 2. Chronic mild right hydronephrosis and right pelviectasis with abrupt change in caliber at the ureteropelvic junction. The renal pelvis does not fully opacify with excreted intravenous contrast on delayed imaging due to timing and therefore cannot be fully evaluated. Associated right renal cortical scarring. If not already previously obtained, consider nonemergent urologic consultation. 3. Tiny fat containing umbilical hernia with no findings of associated ischemia or bowel obstruction. Electronically Signed   By:  Iven Finn M.D.   On: 10/07/2020 20:05   CT ABDOMEN PELVIS W CONTRAST  Final Result      Consults called:       Dwyane Dee, MD [facility] 10/08/2020, 11:37 AM

## 2020-10-09 DIAGNOSIS — F419 Anxiety disorder, unspecified: Secondary | ICD-10-CM | POA: Diagnosis not present

## 2020-10-09 DIAGNOSIS — E1169 Type 2 diabetes mellitus with other specified complication: Secondary | ICD-10-CM | POA: Diagnosis not present

## 2020-10-09 DIAGNOSIS — R197 Diarrhea, unspecified: Secondary | ICD-10-CM | POA: Diagnosis not present

## 2020-10-09 DIAGNOSIS — F32A Depression, unspecified: Secondary | ICD-10-CM | POA: Diagnosis not present

## 2020-10-09 DIAGNOSIS — E669 Obesity, unspecified: Secondary | ICD-10-CM

## 2020-10-09 LAB — GLUCOSE, CAPILLARY
Glucose-Capillary: 146 mg/dL — ABNORMAL HIGH (ref 70–99)
Glucose-Capillary: 147 mg/dL — ABNORMAL HIGH (ref 70–99)
Glucose-Capillary: 142 mg/dL — ABNORMAL HIGH (ref 70–99)

## 2020-10-09 LAB — GASTROINTESTINAL PANEL BY PCR, STOOL (REPLACES STOOL CULTURE)

## 2020-10-09 LAB — CBC WITH DIFFERENTIAL/PLATELET
Abs Immature Granulocytes: 0.07 10*3/uL (ref 0.00–0.07)
Basophils Absolute: 0 10*3/uL (ref 0.0–0.1)
Basophils Relative: 0 %
Eosinophils Absolute: 0.2 10*3/uL (ref 0.0–0.5)
Eosinophils Relative: 2 %
HCT: 39.9 % (ref 36.0–46.0)
Hemoglobin: 12.3 g/dL (ref 12.0–15.0)
Immature Granulocytes: 1 %
Lymphocytes Relative: 27 %
Lymphs Abs: 2.6 10*3/uL (ref 0.7–4.0)
MCH: 28.7 pg (ref 26.0–34.0)
MCHC: 30.8 g/dL (ref 30.0–36.0)
MCV: 93.2 fL (ref 80.0–100.0)
Monocytes Absolute: 0.5 10*3/uL (ref 0.1–1.0)
Monocytes Relative: 6 %
Neutro Abs: 6.1 10*3/uL (ref 1.7–7.7)
Neutrophils Relative %: 64 %
Platelets: 231 10*3/uL (ref 150–400)
RBC: 4.28 MIL/uL (ref 3.87–5.11)
RDW: 13.3 % (ref 11.5–15.5)
WBC: 9.5 10*3/uL (ref 4.0–10.5)
nRBC: 0 % (ref 0.0–0.2)

## 2020-10-09 LAB — BASIC METABOLIC PANEL
Anion gap: 8 (ref 5–15)
BUN: 11 mg/dL (ref 8–23)
CO2: 27 mmol/L (ref 22–32)
Calcium: 9.8 mg/dL (ref 8.9–10.3)
Chloride: 104 mmol/L (ref 98–111)
Creatinine, Ser: 0.98 mg/dL (ref 0.44–1.00)
GFR, Estimated: >60 mL/min (ref >60)
Glucose, Bld: 130 mg/dL — ABNORMAL HIGH (ref 70–99)
Potassium: 3.9 mmol/L (ref 3.5–5.1)
Sodium: 139 mmol/L (ref 135–145)

## 2020-10-09 LAB — MAGNESIUM: Magnesium: 2.2 mg/dL (ref 1.7–2.4)

## 2020-10-09 MED ORDER — ROPINIROLE HCL 1 MG PO TABS: 2.0000 mg | ORAL_TABLET | ORAL | Status: DC

## 2020-10-09 MED ORDER — INSULIN GLARGINE 100 UNIT/ML ~~LOC~~ SOLN
60.0000 [IU] | Freq: Every day | SUBCUTANEOUS | Status: DC
  Filled 2020-10-09: qty 0.6

## 2020-10-09 MED ORDER — ROPINIROLE HCL 1 MG PO TABS: 2.0000 mg | ORAL_TABLET | Freq: Every day | ORAL | Status: DC

## 2020-10-09 MED ORDER — VITAMIN D 25 MCG (1000 UNIT) PO TABS: 3000.0000 [IU] | ORAL_TABLET | Freq: Every day | ORAL | Status: DC

## 2020-10-09 MED ORDER — LOSARTAN POTASSIUM 50 MG PO TABS: 25.0000 mg | ORAL_TABLET | Freq: Every day | ORAL | Status: DC

## 2020-10-09 MED ORDER — ROPINIROLE HCL 1 MG PO TABS: 1.5000 mg | ORAL_TABLET | Freq: Every day | ORAL | Status: DC

## 2020-10-09 MED ORDER — MELATONIN 5 MG PO TABS: 10.0000 mg | ORAL_TABLET | Freq: Every day | ORAL | Status: DC

## 2020-10-09 MED ORDER — LOPERAMIDE HCL 2 MG PO CAPS: 2.0000 mg | ORAL_CAPSULE | Freq: Two times a day (BID) | ORAL | Status: DC | PRN

## 2020-10-09 MED ORDER — B COMPLEX-C PO TABS
1.0000 | ORAL_TABLET | Freq: Every day | ORAL | Status: DC
  Filled 2020-10-09: qty 1

## 2020-10-09 NOTE — Plan of Care (Signed)

## 2020-10-09 NOTE — Discharge Summary (Signed)
Physician Discharge Summary  [name] [name] [id number] DOB: Jun 01, 1945 DOA: 10/07/2020  PCP: London Pepper, MD  Admit date: 10/07/2020 Discharge date: 10/09/2020  Time spent: 60 minutes  Recommendations for Outpatient Follow-up:  Follow-up PCP in 2 weeks   Discharge Diagnoses:  Principal Problem:   Diarrhea Active Problems:   Anxiety and depression   Bipolar 1 disorder ([place])   Hypothyroidism   Diabetes mellitus type 2 in obese [facility])   Essential hypertension   Discharge Condition: Stable  Diet recommendation: Heart healthy diet  Filed Weights   10/07/20 1638 10/08/20 0621  Weight: 81.2 kg 83.3 kg    History of present illness:  75 year old female with history of diverticulosis, bipolar 1 disorder, anxiety depression, gastroparesis, hyperlipidemia, hypertension, hypothyroidism, IBS, osteoarthritis, RLS, diabetes mellitus 2 presented to ED with complaints of recurrent diarrhea and abdominal pain/cramping.  She has been on 2 courses of Augmentin outpatient for past 1 month for recent diverticulitis.  Each course of Augmentin was 10 days.  She developed C. difficile after antibiotic courses and was treated with 10 days of vancomycin which was completed on 09/28/2020. Patient also had a CT scan abdomen pelvis which was unremarkable except for chronic hydronephrosis on right with right pelviectasis.  Hospital Course:   Diarrhea -Improved -Stool for C. difficile PCR was negative -GI pathogen panel also was negative -Recommended to continue taking Imodium as needed -Follow gastroenterology as outpatient  Hypertension -Continue home medications  Diabetes mellitus type 2 -Continue home medications  Chronic right mild hydronephrosis -As per patient seen was seen by [facility] 4 months ago -Did not recommend any intervention -I recommend her to follow-up with urology as outpatient  Procedures:   Consultations:   Discharge Exam: Vitals:   10/09/20 0728  10/09/20 1145  BP: 125/69 127/83  Pulse: 75 72  Resp: 18 18  Temp: 98 F (36.7 C) 98.2 F (36.8 C)  SpO2: 95% 95%    General-appears in no acute distress Heart-S1-S2, regular, no murmur auscultated Lungs-clear to auscultation bilaterally, no wheezing or crackles auscultated Abdomen-soft, nontender, no organomegaly Extremities-no edema in the lower extremities Neuro-alert, oriented x3, no focal deficit noted  Discharge Instructions   Discharge Instructions     Diet - low sodium heart healthy   Complete by: As directed    Increase activity slowly   Complete by: As directed       Allergies as of 10/09/2020       Reactions   Codeine Anxiety, Other (See Comments)   Hallucinations, tolerates oxycodone    Procaine Hcl Palpitations   Aspirin Nausea And Vomiting, Other (See Comments)   Reaction:  Burns pts stomach    Augmentin [amoxicillin-pot Clavulanate] Diarrhea   Benadryl [diphenhydramine] Other (See Comments)   Per MD "inhibits potency of gabapentin, lithium etc"   Diflucan [fluconazole] Other (See Comments)   Unknown reaction   Dilaudid [hydromorphone Hcl] Other (See Comments)   Migraines and nightmares    Other Nausea And Vomiting, Other (See Comments)   Pt states that all -mycins cause N/V. (MACROLIDES)    Prednisone    Long term steroid problems with lithium and blood sugar.    Tramadol Hcl    Other reaction(s): felt weird   Sulfa Antibiotics Other (See Comments)   Unknown reaction        Medication List     STOP taking these medications    amoxicillin-clavulanate 875-125 MG tablet Commonly known as: AUGMENTIN   oxyCODONE-acetaminophen 5-325 MG tablet Commonly known as: PERCOCET/ROXICET  vancomycin 125 MG capsule Commonly known as: VANCOCIN       TAKE these medications    ALPRAZolam 0.5 MG tablet Commonly known as: XANAX Take 1 tablet (0.5 mg total) by mouth 4 (four) times daily as needed for anxiety.   atorvastatin 40 MG tablet Commonly  known as: LIPITOR Take 40 mg by mouth every evening.   b complex vitamins capsule Take 1 capsule by mouth daily.   BD Pen Needle Nano 2nd Gen 32G X 4 MM Misc Generic drug: Insulin Pen Needle USE TO INJECT 5 TIMES DAILY AS DIRECTED   cholecalciferol 25 MCG (1000 UNIT) tablet Commonly known as: VITAMIN D3 Take 3,000 Units by mouth daily.   Contour Next Test test strip Generic drug: glucose blood USE TO TEST BLOOD SUGAR TWICE DAILY   divalproex 250 MG DR tablet Commonly known as: DEPAKOTE Take 2 tablets (500 mg total) by mouth at bedtime.   donepezil 10 MG tablet Commonly known as: ARICEPT Take 1 tablet (10 mg total) by mouth at bedtime.   gabapentin 600 MG tablet Commonly known as: NEURONTIN Take 600 mg by mouth 3 (three) times daily.   Lantus SoloStar 100 UNIT/ML Solostar Pen Generic drug: insulin glargine Inject 64 Units into the skin at bedtime. What changed: how much to take   levothyroxine 75 MCG tablet Commonly known as: SYNTHROID TAKE 1 TABLET BY MOUTH EVERY DAY BEFORE BREAKFAST What changed: See the new instructions.   lithium carbonate 450 MG CR tablet Commonly known as: ESKALITH Take 1 tablet (450 mg total) by mouth at bedtime.   loperamide 2 MG tablet Commonly known as: IMODIUM A-D Take 2 mg by mouth 2 (two) times daily as needed for diarrhea or loose stools.   losartan 25 MG tablet Commonly known as: COZAAR Take 25 mg by mouth daily.   Melatonin 10 MG Tbcr Take 10 mg by mouth at bedtime.   memantine 10 MG tablet Commonly known as: NAMENDA TAKE 1 TABLET(10 MG) BY MOUTH EVERY EVENING   Microlet Lancets Misc TEST DAILY AS DIRECTED   nitroGLYCERIN 0.4 MG SL tablet Commonly known as: NITROSTAT Place 1 tablet (0.4 mg total) under the tongue every 5 (five) minutes as needed for chest pain.   ondansetron 4 MG tablet Commonly known as: ZOFRAN Take 4 mg by mouth every 8 (eight) hours as needed for nausea or vomiting.   Ozempic (1 MG/DOSE) 2  MG/1.5ML Sopn Generic drug: Semaglutide (1 MG/DOSE) Inject 1 mg into the skin once a week.   PROBIOTIC ADVANCED PO Take by mouth 2 (two) times daily.   QUEtiapine 200 MG tablet Commonly known as: SEROQUEL TAKE 1 TAB PO q 5 pm and 1 tab po QHS   rOPINIRole 0.5 MG tablet Commonly known as: REQUIP Takes 4 tabs at 5 pm and 3 tabs at bedtime and 1-2 tabs prn       Allergies  Allergen Reactions   Codeine Anxiety and Other (See Comments)    Hallucinations, tolerates oxycodone    Procaine Hcl Palpitations   Aspirin Nausea And Vomiting and Other (See Comments)    Reaction:  Burns pts stomach    Augmentin [Amoxicillin-Pot Clavulanate] Diarrhea   Benadryl [Diphenhydramine] Other (See Comments)    Per MD "inhibits potency of gabapentin, lithium etc"   Diflucan [Fluconazole] Other (See Comments)    Unknown reaction    Dilaudid [Hydromorphone Hcl] Other (See Comments)    Migraines and nightmares    Other Nausea And Vomiting and Other (See Comments)  Pt states that all -mycins cause N/V. (MACROLIDES)    Prednisone     Long term steroid problems with lithium and blood sugar.    Tramadol Hcl     Other reaction(s): felt weird   Sulfa Antibiotics Other (See Comments)    Unknown reaction      The results of significant diagnostics from this hospitalization (including imaging, microbiology, ancillary and laboratory) are listed below for reference.    Significant Diagnostic Studies: CT ABDOMEN PELVIS W CONTRAST  Result Date: 10/07/2020 CLINICAL DATA:  Sent from GI for abd CT to r/o colitis, abd pain x 2 days this time with nausea and diarrhea. Frequently has abd problems. Diverticulitis suspected EXAM: CT ABDOMEN AND PELVIS WITH CONTRAST TECHNIQUE: Multidetector CT imaging of the abdomen and pelvis was performed using the standard protocol following bolus administration of intravenous contrast. CONTRAST:  177m OMNIPAQUE IOHEXOL 300 MG/ML  SOLN COMPARISON:  CT abdomen pelvis 09/06/2020,  CT abdomen pelvis 05/30/2020, MR abdomen 5 522 FINDINGS: Lower chest: No acute abnormality. Patulous distal esophagus with no definite hiatal hernia. Hepatobiliary: No focal liver abnormality. No gallstones, gallbladder wall thickening, or pericholecystic fluid. No biliary dilatation. Pancreas: No focal lesion. Normal pancreatic contour. No surrounding inflammatory changes. No main pancreatic ductal dilatation. Spleen: Normal in size without focal abnormality. Adrenals/Urinary Tract: No adrenal nodule bilaterally. Right renal cortical scarring. Bilateral kidneys enhance symmetrically. Redemonstration of mild right hydronephrosis with prominence of the right renal pelvis with abrupt change in caliber at the ureteropelvic junction. The renal pelvis does not fully opacify with excreted intravenous contrast on delayed imaging due to timing and therefore cannot be fully evaluated. No hydronephrosis. No hydroureter. The urinary bladder is unremarkable. Please note limited evaluation of distal ureters and urinary bladder due to streak artifact originating from the right surgical femoral hardware. Stomach/Bowel: Stomach is within normal limits. No evidence of small bowel wall thickening or dilatation. Diffuse sigmoid diverticulosis. Mild bowel wall thickening of the cecum and ascending colon likely due to under distension. The appendix is not definitely identified. Vascular/Lymphatic: No abdominal aorta or iliac aneurysm. Mild atherosclerotic plaque of the aorta and its branches. No abdominal, pelvic, or inguinal lymphadenopathy. Reproductive: Status post hysterectomy. No adnexal masses. Other: No intraperitoneal free fluid. No intraperitoneal free gas. No organized fluid collection. Musculoskeletal: Tiny fat containing umbilical hernia. No suspicious lytic or blastic osseous lesions. No acute displaced fracture. Total right hip arthroplasty partially visualized. IMPRESSION: 1. Diffuse sigmoid diverticulosis with no acute  diverticulitis. 2. Chronic mild right hydronephrosis and right pelviectasis with abrupt change in caliber at the ureteropelvic junction. The renal pelvis does not fully opacify with excreted intravenous contrast on delayed imaging due to timing and therefore cannot be fully evaluated. Associated right renal cortical scarring. If not already previously obtained, consider nonemergent urologic consultation. 3. Tiny fat containing umbilical hernia with no findings of associated ischemia or bowel obstruction. Electronically Signed   By: MIven FinnM.D.   On: 10/07/2020 20:05    Microbiology: Recent Results (from the past 240 hour(s))  Resp Panel by RT-PCR (Flu A&B, Covid) Nasopharyngeal Swab     Status: None   Collection Time: 10/07/20 11:57 PM   Specimen: Nasopharyngeal Swab; Nasopharyngeal(NP) swabs in vial transport medium  Result Value Ref Range Status   SARS Coronavirus 2 by RT PCR NEGATIVE NEGATIVE Final    Comment: (NOTE) SARS-CoV-2 target nucleic acids are NOT DETECTED.  The SARS-CoV-2 RNA is generally detectable in upper respiratory specimens during the acute phase of infection. The  lowest concentration of SARS-CoV-2 viral copies this assay can detect is 138 copies/mL. A negative result does not preclude SARS-Cov-2 infection and should not be used as the sole basis for treatment or other patient management decisions. A negative result may occur with  improper specimen collection/handling, submission of specimen other than nasopharyngeal swab, presence of viral mutation(s) within the areas targeted by this assay, and inadequate number of viral copies(<138 copies/mL). A negative result must be combined with clinical observations, patient history, and epidemiological information. The expected result is Negative.  Fact Sheet for Patients:  EntrepreneurPulse.com.au  Fact Sheet for Healthcare Providers:  IncredibleEmployment.be  This test is no t  yet approved or cleared by the [place] FDA and  has been authorized for detection and/or diagnosis of SARS-CoV-2 by FDA under an Emergency Use Authorization (EUA). This EUA will remain  in effect (meaning this test can be used) for the duration of the COVID-19 declaration under Section 564(b)(1) of the Act, 21 U.S.C.section 360bbb-3(b)(1), unless the authorization is terminated  or revoked sooner.       Influenza A by PCR NEGATIVE NEGATIVE Final   Influenza B by PCR NEGATIVE NEGATIVE Final    Comment: (NOTE) The Xpert Xpress SARS-CoV-2/FLU/RSV plus assay is intended as an aid in the diagnosis of influenza from Nasopharyngeal swab specimens and should not be used as a sole basis for treatment. Nasal washings and aspirates are unacceptable for Xpert Xpress SARS-CoV-2/FLU/RSV testing.  Fact Sheet for Patients: EntrepreneurPulse.com.au  Fact Sheet for Healthcare Providers: IncredibleEmployment.be  This test is not yet approved or cleared by the [place] FDA and has been authorized for detection and/or diagnosis of SARS-CoV-2 by FDA under an Emergency Use Authorization (EUA). This EUA will remain in effect (meaning this test can be used) for the duration of the COVID-19 declaration under Section 564(b)(1) of the Act, 21 U.S.C. section 360bbb-3(b)(1), unless the authorization is terminated or revoked.  Performed at [facility], [place], [place] [address]   C Difficile Quick Screen w PCR reflex     Status: None   Collection Time: 10/08/20  2:15 AM   Specimen: STOOL  Result Value Ref Range Status   C Diff antigen NEGATIVE NEGATIVE Final   C Diff toxin NEGATIVE NEGATIVE Final   C Diff interpretation No C. difficile detected.  Final    Comment: Performed at [facility], [place] [address], [place], [place] [address]  MRSA Next Gen by PCR, Nasal     Status: None   Collection Time: 10/08/20  6:41 AM    Specimen: Nasal Mucosa; Nasal Swab  Result Value Ref Range Status   MRSA by PCR Next Gen NOT DETECTED NOT DETECTED Final    Comment: (NOTE) The GeneXpert MRSA Assay (FDA approved for NASAL specimens only), is one component of a comprehensive MRSA colonization surveillance program. It is not intended to diagnose MRSA infection nor to guide or monitor treatment for MRSA infections. Test performance is not FDA approved in patients less than 24 years old. Performed at [facility], [place] [address], [place], [place] [address]   Gastrointestinal Panel by PCR , Stool     Status: None   Collection Time: 10/09/20  8:27 AM   Specimen: STOOL  Result Value Ref Range Status   Campylobacter species NOT DETECTED NOT DETECTED Final   Plesimonas shigelloides NOT DETECTED NOT DETECTED Final   Salmonella species NOT DETECTED NOT DETECTED Final   Yersinia enterocolitica NOT DETECTED NOT  DETECTED Final   Vibrio species NOT DETECTED NOT DETECTED Final   Vibrio cholerae NOT DETECTED NOT DETECTED Final   Enteroaggregative E coli (EAEC) NOT DETECTED NOT DETECTED Final   Enteropathogenic E coli (EPEC) NOT DETECTED NOT DETECTED Final   Enterotoxigenic E coli (ETEC) NOT DETECTED NOT DETECTED Final   Shiga like toxin producing E coli (STEC) NOT DETECTED NOT DETECTED Final   Shigella/Enteroinvasive E coli (EIEC) NOT DETECTED NOT DETECTED Final   Cryptosporidium NOT DETECTED NOT DETECTED Final   Cyclospora cayetanensis NOT DETECTED NOT DETECTED Final   Entamoeba histolytica NOT DETECTED NOT DETECTED Final   Giardia lamblia NOT DETECTED NOT DETECTED Final   Adenovirus F40/41 NOT DETECTED NOT DETECTED Final   Astrovirus NOT DETECTED NOT DETECTED Final   Norovirus GI/GII NOT DETECTED NOT DETECTED Final   Rotavirus A NOT DETECTED NOT DETECTED Final   Sapovirus (I, II, IV, and V) NOT DETECTED NOT DETECTED Final    Comment: Performed at [facility], [address] [place], [place] [address]      Labs: Basic Metabolic Panel: Recent Labs  Lab 10/07/20 1756 10/09/20 0048  NA 139 139  K 3.9 3.9  CL 103 104  CO2 26 27  GLUCOSE 141* 130*  BUN 13 11  CREATININE 0.81 0.98  CALCIUM 10.3 9.8  MG  --  2.2   Liver Function Tests: Recent Labs  Lab 10/07/20 1756  AST 15  ALT 15  ALKPHOS 91  BILITOT 0.5  PROT 7.2  ALBUMIN 4.4   No results for input(s): LIPASE, AMYLASE in the last 168 hours. No results for input(s): AMMONIA in the last 168 hours. CBC: Recent Labs  Lab 10/07/20 1756 10/09/20 0048  WBC 11.5* 9.5  NEUTROABS 8.5* 6.1  HGB 14.5 12.3  HCT 45.1 39.9  MCV 91.7 93.2  PLT 233 231     CBG: Recent Labs  Lab 10/08/20 1218 10/08/20 1622 10/08/20 2056 10/09/20 0726 10/09/20 1147  GLUCAP 129* 116* 120* 146* 147*       Signed:  Oswald Hillock MD.  [facility] 10/09/2020, 4:22 PM

## 2020-10-15 ENCOUNTER — Ambulatory Visit (INDEPENDENT_AMBULATORY_CARE_PROVIDER_SITE_OTHER): Payer: Medicare Other | Admitting: Endocrinology

## 2020-10-15 ENCOUNTER — Encounter: Payer: Self-pay | Admitting: Endocrinology

## 2020-10-15 ENCOUNTER — Other Ambulatory Visit: Payer: Self-pay

## 2020-10-15 VITALS — BP 148/90 | HR 77 | Ht 62.0 in | Wt 181.6 lb

## 2020-10-15 DIAGNOSIS — E1165 Type 2 diabetes mellitus with hyperglycemia: Secondary | ICD-10-CM | POA: Diagnosis not present

## 2020-10-15 DIAGNOSIS — I10 Essential (primary) hypertension: Secondary | ICD-10-CM | POA: Diagnosis not present

## 2020-10-15 DIAGNOSIS — Z794 Long term (current) use of insulin: Secondary | ICD-10-CM

## 2020-10-15 DIAGNOSIS — E039 Hypothyroidism, unspecified: Secondary | ICD-10-CM | POA: Diagnosis not present

## 2020-10-15 NOTE — Progress Notes (Signed)
Patient ID: [name] [name], female   DOB: [birth year], 75 y.o.   MRN: [id number]            Reason for Appointment: Follow-up for Type 2 Diabetes   History of Present Illness:          Date of diagnosis of type 2 diabetes mellitus: ?  2014        Background history:   She thinks her blood sugar was 400 at the time of diagnosis but no detailed records of this are available She did have an A1c of 10.6 done in 2014 and was probably given Lantus for some time initially Apparently she was treated with various medications including metformin, Janumet and Tradgenta. Her blood sugars had improved and A1c in 2015 was down to 6.2 She tends to have diarrhea with metformin and Janumet and also she thinks it causes dry mouth Most likely Janumet was stopped in 06/2015 Glipizide was started in 8/17 when blood sugars were higher and A1c was 9%  Recent history:   INSULIN regimen is:   Lantus 64 units daily at 5 pm     Non-insulin hypoglycemic drugs the patient is taking are: 1 mg Ozempic weekly  Her A1c is last 6.6  Fructosamine is 241  Current management, blood sugar patterns and problems identified:  She still is having difficulty getting her morning sugars controlled because of likely snacks during the night or late at night  This is despite increasing her Ozempic to 1 mg on the last visit  No nausea with this As before she forgets to take her blood sugar readings after dinner despite reminders  Highest blood sugar was 190 at about 6:30 AM  However blood sugars may be as low as 90 2 in the afternoon  Because of various limitations she is not able to do any significant walking for exercise  Has not lost any weight  Side effects from medications have been:?  Diarrhea from metformin and Janumet  Glucose monitoring:  done 1 time a day or less        Glucometer: Contour        Blood Glucose readings    PRE-MEAL Fasting Lunch Dinner Bedtime Overall  Glucose range: 136-190       Mean/median: 160    150   POST-MEAL PC Breakfast PC Lunch PC Dinner  Glucose range:  92-144   Mean/median:        PRE-MEAL Fasting Lunch Dinner Bedtime Overall  Glucose range:  111-190    237   Mean/median:  168   154  158   POST-MEAL PC Breakfast PC Lunch PC Dinner  Glucose range:     Mean/median:    147    Self-care: The diet that the patient has been following [id number], tries to limit drinks with sugar .      Typical meal intake: Breakfast at 9,  May be eggs and sausage and lunch is usually a sandwich   Dinner 7 pm               Dietician visit, most recent: 10/18                Weight history:   Wt Readings from Last 3 Encounters:  10/15/20 181 lb 9.6 oz (82.4 kg)  10/08/20 183 lb 11.2 oz (83.3 kg)  09/10/20 179 lb 14.3 oz (81.6 kg)    Glycemic control:   Lab Results  Component Value Date   HGBA1C 6.6 (H) 10/08/2020  HGBA1C 6.8 (A) 06/20/2020   HGBA1C 6.1 12/04/2019   Lab Results  Component Value Date   MICROALBUR <0.7 08/09/2020   LDLCALC 79 08/09/2020   CREATININE 0.98 10/09/2020   Lab Results  Component Value Date   MICRALBCREAT 1.1 08/09/2020       Allergies as of 10/15/2020       Reactions   Codeine Anxiety, Other (See Comments)   Hallucinations, tolerates oxycodone    Procaine Hcl Palpitations   Aspirin Nausea And Vomiting, Other (See Comments)   Reaction:  Burns pts stomach    Augmentin [amoxicillin-pot Clavulanate] Diarrhea   Benadryl [diphenhydramine] Other (See Comments)   Per MD "inhibits potency of gabapentin, lithium etc"   Diflucan [fluconazole] Other (See Comments)   Unknown reaction   Dilaudid [hydromorphone Hcl] Other (See Comments)   Migraines and nightmares    Other Nausea And Vomiting, Other (See Comments)   Pt states that all -mycins cause N/V. (MACROLIDES)    Prednisone    Long term steroid problems with lithium and blood sugar.    Tramadol Hcl    Other reaction(s): felt weird   Sulfa Antibiotics Other (See  Comments)   Unknown reaction        Medication List        Accurate as of October 15, 2020  2:19 PM. If you have any questions, ask your nurse or doctor.          ALPRAZolam 0.5 MG tablet Commonly known as: XANAX Take 1 tablet (0.5 mg total) by mouth 4 (four) times daily as needed for anxiety.   atorvastatin 40 MG tablet Commonly known as: LIPITOR Take 40 mg by mouth every evening.   b complex vitamins capsule Take 1 capsule by mouth daily.   BD Pen Needle Nano 2nd Gen 32G X 4 MM Misc Generic drug: Insulin Pen Needle USE TO INJECT 5 TIMES DAILY AS DIRECTED   cholecalciferol 25 MCG (1000 UNIT) tablet Commonly known as: VITAMIN D3 Take 3,000 Units by mouth daily.   Contour Next Test test strip Generic drug: glucose blood USE TO TEST BLOOD SUGAR TWICE DAILY   divalproex 250 MG DR tablet Commonly known as: DEPAKOTE Take 2 tablets (500 mg total) by mouth at bedtime.   donepezil 10 MG tablet Commonly known as: ARICEPT Take 1 tablet (10 mg total) by mouth at bedtime.   gabapentin 600 MG tablet Commonly known as: NEURONTIN Take 600 mg by mouth 3 (three) times daily.   Lantus SoloStar 100 UNIT/ML Solostar Pen Generic drug: insulin glargine Inject 64 Units into the skin at bedtime.   levothyroxine 75 MCG tablet Commonly known as: SYNTHROID TAKE 1 TABLET BY MOUTH EVERY DAY BEFORE BREAKFAST   lithium carbonate 450 MG CR tablet Commonly known as: ESKALITH Take 1 tablet (450 mg total) by mouth at bedtime.   loperamide 2 MG tablet Commonly known as: IMODIUM A-D Take 2 mg by mouth 2 (two) times daily as needed for diarrhea or loose stools.   losartan 25 MG tablet Commonly known as: COZAAR Take 25 mg by mouth daily.   Melatonin 10 MG Tbcr Take 10 mg by mouth at bedtime.   memantine 10 MG tablet Commonly known as: NAMENDA TAKE 1 TABLET(10 MG) BY MOUTH EVERY EVENING   Microlet Lancets Misc TEST DAILY AS DIRECTED   nitroGLYCERIN 0.4 MG SL tablet Commonly  known as: NITROSTAT Place 1 tablet (0.4 mg total) under the tongue every 5 (five) minutes as needed for chest pain.  ondansetron 4 MG tablet Commonly known as: ZOFRAN Take 4 mg by mouth every 8 (eight) hours as needed for nausea or vomiting.   Ozempic (1 MG/DOSE) 2 MG/1.5ML Sopn Generic drug: Semaglutide (1 MG/DOSE) Inject 1 mg into the skin once a week.   PROBIOTIC ADVANCED PO Take by mouth 2 (two) times daily.   QUEtiapine 200 MG tablet Commonly known as: SEROQUEL TAKE 1 TAB PO q 5 pm and 1 tab po QHS   rOPINIRole 0.5 MG tablet Commonly known as: REQUIP Takes 4 tabs at 5 pm and 3 tabs at bedtime and 1-2 tabs prn        Allergies:  Allergies  Allergen Reactions   Codeine Anxiety and Other (See Comments)    Hallucinations, tolerates oxycodone    Procaine Hcl Palpitations   Aspirin Nausea And Vomiting and Other (See Comments)    Reaction:  Burns pts stomach    Augmentin [Amoxicillin-Pot Clavulanate] Diarrhea   Benadryl [Diphenhydramine] Other (See Comments)    Per MD "inhibits potency of gabapentin, lithium etc"   Diflucan [Fluconazole] Other (See Comments)    Unknown reaction    Dilaudid [Hydromorphone Hcl] Other (See Comments)    Migraines and nightmares    Other Nausea And Vomiting and Other (See Comments)    Pt states that all -mycins cause N/V. (MACROLIDES)    Prednisone     Long term steroid problems with lithium and blood sugar.    Tramadol Hcl     Other reaction(s): felt weird   Sulfa Antibiotics Other (See Comments)    Unknown reaction    Past Medical History:  Diagnosis Date   Alcohol abuse    Anxiety    takes Valium daily as needed and Ativan daily   Bilateral hearing loss    Bipolar 1 disorder ([place])    takes Lithium nightly and Synthroid daily   Chronic back pain    DDD; "all over" (09/14/2017)   Colitis, ischemic ([place]) 2012   Confusion    r/t meds   Depression    takes Prozac daily and Bupspirone    Diverticulosis    Dyslipidemia     takes Crestor daily   Fibromyalgia    Gastroparesis    Headache    "weekly" (09/14/2017)   Hepatic steatosis 06/18/12   severe   Hyperlipidemia    Hypertension    Hypothyroidism    IBS (irritable bowel syndrome)    Ischemic colitis ([place])    Joint pain    Joint swelling    Lupus erythematosus tumidus    tumid-skin   Migraine    "1-2/yr; maybe" (09/14/2017)   Numbness    in right foot   Osteoarthritis    "all over" (09/14/2017)   Osteoarthritis cervical spine    Osteoarthritis of hand    bilateral   Pneumonia    "walking pneumonia several times; long time since the last time" (09/14/2017)   Restless leg syndrome    takes Requip nightly   Sciatica    Type II diabetes mellitus ([place])    Urinary frequency    Urinary leakage    Urinary urgency    Urinary, incontinence, stress female    Walking pneumonia    last time more than 61yr ago    Past Surgical History:  Procedure Laterality Date   ABDOMINAL HYSTERECTOMY     "they left my ovaries"   APPENDECTOMY     BALLOON DILATION N/A 06/14/2020   Procedure: BALLOON DILATION;  Surgeon: KRonnette Juniper MD;  Location: [facility];  Service: Gastroenterology;  Laterality: N/A;   BIOPSY  06/14/2020   Procedure: BIOPSY;  Surgeon: Ronnette Juniper, MD;  Location: [facility];  Service: Gastroenterology;;   CARDIAC CATHETERIZATION  09/14/2017   COLONOSCOPY     DENTAL SURGERY Left 10/2016   dental implant   DILATION AND CURETTAGE OF UTERUS  X 4   ESOPHAGOGASTRODUODENOSCOPY     ESOPHAGOGASTRODUODENOSCOPY (EGD) WITH PROPOFOL N/A 06/14/2020   Procedure: ESOPHAGOGASTRODUODENOSCOPY (EGD) WITH PROPOFOL;  Surgeon: Ronnette Juniper, MD;  Location: [facility];  Service: Gastroenterology;  Laterality: N/A;   FLEXIBLE SIGMOIDOSCOPY N/A 06/21/2012   Procedure: FLEXIBLE SIGMOIDOSCOPY;  Surgeon: Jerene Bears, MD;  Location: [facility];  Service: Gastroenterology;  Laterality: N/A;   JOINT REPLACEMENT     LEFT HEART CATH AND CORONARY ANGIOGRAPHY N/A 09/14/2017    Procedure: LEFT HEART CATH AND CORONARY ANGIOGRAPHY;  Surgeon: Belva Crome, MD;  Location: [facility];  Service: Cardiovascular;  Laterality: N/A;   SHOULDER ARTHROSCOPY Right    "shaved spurs off rotator cuff"   TONSILLECTOMY     TOTAL HIP ARTHROPLASTY Right 06/09/2013   Procedure: TOTAL HIP ARTHROPLASTY;  Surgeon: Kerin Salen, MD;  Location: [facility];  Service: Orthopedics;  Laterality: Right;   TOTAL KNEE ARTHROPLASTY Left 02/07/2019   Procedure: TOTAL KNEE ARTHROPLASTY;  Surgeon: Paralee Cancel, MD;  Location: [facility];  Service: Orthopedics;  Laterality: Left;  70 mins   TUBAL LIGATION     TUMOR EXCISION Right 1968   angle of jaw; benign    Family History  Problem Relation Age of Onset   Drug abuse Mother    Alcohol abuse Mother    Alcohol abuse Father    Hypertension Father    CAD Brother    Hypertension Brother    Alcohol abuse Brother    Hypertension Brother    Hypertension Brother    Psoriasis Daughter    Arthritis Daughter        psoriatic arthritis    Alcohol abuse Grandchild     Social History:  reports that she has never smoked. She has never used smokeless tobacco. She reports previous alcohol use. She reports previous drug use. Frequency: 7.00 times per week. Drug: Benzodiazepines.   Review of Systems    Lipid history: On Lipitor 40 mg  LDL has come back to normal with regular regimen of Lipitor  Triglycerides last below 200  Taking OTC fish oil    Lab Results  Component Value Date   CHOL 172 08/09/2020   HDL 56.90 08/09/2020   LDLCALC 79 08/09/2020   LDLDIRECT 137.0 12/04/2019   TRIG 180.0 (H) 08/09/2020   CHOLHDL 3 08/09/2020           BP Readings from Last 3 Encounters:  10/15/20 (!) 148/90  10/09/20 (!) 129/57  09/16/20 (!) 143/61     Most recent eye exam was In 03/2018, negative  Most recent foot exam: 6/21  THYROID: She has been on levothyroxine and Cytomel from her psychiatrist for several years with uncertain diagnosis She is  taking levothyroxine 75 g, with Cytomel 25 g Liothyronine is not on her medication list and may have been discontinued No complaints of fatigue now  Also on lithium  Her TSH is consistently normal Free T3 and free T4 previously normal  Labs as follows:  Lab Results  Component Value Date   TSH 1.488 10/08/2020   TSH 1.37 08/28/2019   TSH 1.02 04/26/2019   FREET4 0.90 08/28/2019  FREET4 1.01 04/26/2019   FREET4 0.77 07/04/2018   Lab Results  Component Value Date   T3FREE 3.2 08/28/2019   T3FREE 2.7 04/26/2019   T3FREE 2.5 07/04/2018   T3FREE 2.3 12/21/2017   T3FREE 3.0 03/22/2017    She has atypical depression on multiple drugs, has been on Seroquel for quite some time  THYROID nodule:  She was found to have a nodule in her thyroid isthmus in 8/20 Ultrasound done in 1/21 showed multinodular goiter with only 1 significant nodule   However nodule in the isthmus was biopsied on the recommendation of her PCP in 3/22 and that showed benign follicular nodule  Previously had mild hypercalcemia, last levels:  Lab Results  Component Value Date   PTH 35 10/25/2018   CALCIUM 9.8 10/09/2020   CAION 1.30 07/14/2016   PHOS 2.9 12/26/2010    ABDOMINAL pain better Reportedly has gastroparesis on endoscopy but no nuclear study done She was told not to take Reglan by her GI that was recommended by myself even though she has gastroparesis because of fear of side effects  Physical Examination:  BP (!) 148/90   Pulse 77   Ht '5\' 2"'$  (1.575 m)   Wt 181 lb 9.6 oz (82.4 kg)   SpO2 98%   BMI 33.22 kg/m      ASSESSMENT:  Diabetes type 2, on insulin  See history of present illness for detailed discussion of current diabetes management, blood sugar patterns, monitor download and problems identified  She is on 64 units Lantus insulin with Ozempic 1 mg weekly  Her A1c is 6.6 compared to 6.8, previously 6.1   She has been on 1 mg Ozempic without any increase in her GI  symptoms Not clear if her fasting readings are high because of poor diet at night or high readings after dinner Discussed need for possibly taking Humalog at suppertime if her postprandial readings are consistently high especially when she is avoiding sweets and large portions She is still not able to lose weight  Probable secondary hypothyroidism: TSH normal although will need to check T4 and T3 on the next level If her T3 is also quite normal will not restart her liothyronine  HYPERTENSION: Although her blood pressure may be higher from anxiety and stress she is only taking 25 mg losartan and likely needs higher doses  PLAN:   She will now try Ozempic at 2 mg doses Emphasized the need to check blood sugars after dinner to help assess the need for Humalog  She can set an alarm on her phone or other device to remind her of checking the sugars at night  No change in Lantus to avoid potential low sugars later in the day  Likely cannot get coverage for CGM unless she is taking at least 2 injections of Humalog with adjustment of dose based on blood sugar levels  Encourage her to increase her activity level is much as possible  A1c on the next visit   For her hypertension she will go up to 2 tablets of her losartan until seen by her PCP on Friday  Reminded her to take Lipitor consistently every day   Patient Instructions  Check blood sugars on waking up 3-4 days a week  Also check blood sugars about 2 hours after meals and do this after different meals by rotation  Recommended blood sugar levels on waking up are 90-130 and about 2 hours after meal is 130-180  Please bring your blood sugar monitor to  each visit, thank you  Set up alarm to check sugar at 8-10 pm daily  Ozempic '2mg'$  weekly  Losartan '50mg'$  daily         Elayne Snare 10/15/2020, 2:19 PM   Note: This office note was prepared with Dragon voice recognition system technology. Any transcriptional errors that result from  this process are unintentional.

## 2020-10-15 NOTE — Patient Instructions (Addendum)
Check blood sugars on waking up 3-4 days a week  Also check blood sugars about 2 hours after meals and do this after different meals by rotation  Recommended blood sugar levels on waking up are 90-130 and about 2 hours after meal is 130-180  Please bring your blood sugar monitor to each visit, thank you  Set up alarm to check sugar at 8-10 pm daily  Ozempic '2mg'$  weekly  Losartan '50mg'$  daily

## 2020-10-18 ENCOUNTER — Ambulatory Visit: Payer: Medicare Other | Admitting: Dietician

## 2020-10-18 DIAGNOSIS — R197 Diarrhea, unspecified: Secondary | ICD-10-CM | POA: Diagnosis not present

## 2020-10-18 DIAGNOSIS — Z09 Encounter for follow-up examination after completed treatment for conditions other than malignant neoplasm: Secondary | ICD-10-CM | POA: Diagnosis not present

## 2020-10-18 DIAGNOSIS — R413 Other amnesia: Secondary | ICD-10-CM | POA: Diagnosis not present

## 2020-10-20 ENCOUNTER — Other Ambulatory Visit: Payer: Self-pay | Admitting: Endocrinology

## 2020-10-24 ENCOUNTER — Encounter: Payer: Self-pay | Admitting: Psychiatry

## 2020-10-24 ENCOUNTER — Other Ambulatory Visit: Payer: Self-pay

## 2020-10-24 ENCOUNTER — Ambulatory Visit (INDEPENDENT_AMBULATORY_CARE_PROVIDER_SITE_OTHER): Payer: Medicare Other | Admitting: Psychiatry

## 2020-10-24 DIAGNOSIS — F411 Generalized anxiety disorder: Secondary | ICD-10-CM | POA: Diagnosis not present

## 2020-10-24 DIAGNOSIS — F319 Bipolar disorder, unspecified: Secondary | ICD-10-CM | POA: Diagnosis not present

## 2020-10-24 DIAGNOSIS — Z79899 Other long term (current) drug therapy: Secondary | ICD-10-CM

## 2020-10-24 NOTE — Progress Notes (Signed)
   10/24/20 1048  Facial and Oral Movements  Muscles of Facial Expression 0  Lips and Perioral Area 0  Jaw 0  Tongue 0  Extremity Movements  Upper (arms, wrists, hands, fingers) 0  Lower (legs, knees, ankles, toes) 0  Trunk Movements  Neck, shoulders, hips 0  Overall Severity  Severity of abnormal movements (highest score from questions above) 0  Incapacitation due to abnormal movements 0  Patient's awareness of abnormal movements (rate only patient's report) 0  AIMS Total Score  AIMS Total Score 0

## 2020-10-24 NOTE — Progress Notes (Signed)
[name] [name] [id number] 12/08/1945 75 y.o.  Subjective:   Patient ID:  [name] [name] is a 75 y.o. (DOB December 10, 1945) female.  Chief Complaint:  Chief Complaint  Patient presents with   Sleeping Problem   Anxiety   Depression   Manic Behavior     HPI [name] [name] presents to the office today for follow-up of mood disturbance, sleep disturbance, anxiety, and insomnia. She reports that she has had chronic diarrhea with c. Difficle. She reports that she has been hospitalized for diarrhea. She reports that diarrhea has recently improved.   She reports poor sleep and feels tired. She reports that she is not getting up and doing things during the night- "I just can't sleep." Reports sleeping about 2-3 hours a night for the last couple of weeks. She reports that she has been shopping more. Reports that she shops when her mood is down. She denies any recent gambling. She reports, "my moods are kind of weird right now because I a not sleeping." She reports that she has been having anxiety in response to feeling tired "because I am not alert." She reports some irritability with less sleep. She reports some racing thoughts "and none of it makes any sense." She reports worsening memory and has made an apt with Dr. Brett Fairy for evaluation. She has had difficulty with concentration. Appetite is "always good." Denies SI.  She reports that they have been on the go this summer with traveling and different social gatherings. Hosted 2 different birthday parties for husband's 70th birthday to include 2 birthday cakes, a large banner, etc.   Seeing [name] [name], [facility] every 2-3 weeks.  Past Medication Trials: Tegretol-adverse reaction Lamictal-itching, swollen lymph nodes Trileptal-ineffective Depakote- has taken long-term with some benefit Gabapentin-prescribed for RLS, pain, and anxiety.  Unable to tolerate doses above 400 mg 3 times  daily Topamax Gabitril Lyrica Keppra Zonegran Sonata-intermittently effective Xanax-effective Ativan Valium Klonopin-patient reports feeling "peculiar" Lithium-has taken long-term with some improvement.  Unable to tolerate doses greater than 450 mg Seroquel- helpful for racing thoughts and mood signs and symptoms.  Daytime somnolence with higher doses.  Has caused RLS without Requip. Geodon-tolerability issues Rexulti-EPS Latuda-EPS Vraylar-EPS Saphris-excessive sedation and severe RLS Abilify-may have exacerbated gambling Risperdal Olanzapine- RLS, increased appetite, wt gain Perphenazine Loxapine- severe adverse effects at low dose. Had weakness, twitches BuSpar-ineffective Prozac-effective and then stopped working Zoloft-could not tolerate Lexapro-has used short-term to alleviate depressive signs and symptoms Remeron Wellbutrin Trazodone-nightmares Doxepin- ineffective Remeron Vistaril-ineffective Requip- takes due to restless legs secondary to Seroquel.  Has taken long-term and reports being on higher doses in the past.  Denies correlation between Requip and gambling. Pramipexole NAC Deplin Buprenorphine Namenda Verapamil Pindolol Isradapine Clonidine Lunesta-ineffective Belsomra- ineffective Dayvigo- Legs "buckled up." Ambien Ambien CR-in effective    AIMS    Flowsheet Row Office Visit from 10/24/2020 in [facility] Visit from 06/17/2020 in [facility] Visit from 04/23/2020 in [facility] Visit from 10/09/2019 in [facility] Total Score 0 0 0 [place] Office Visit from 04/23/2020 in [facility] ED from 03/28/2020 in [facility]  Total GAD-7 Score 11 20      PHQ2-9    [place] [facility] from 03/28/2020 in [facility] Nutrition [facility] 04/30/2016 [facility]  PHQ-2  Total Score 0 6 6  PHQ-9 Total Score -- 26 22      Flowsheet Row ED to Hosp-Admission (Discharged) from 10/07/2020 in [facility] ED from 09/16/2020 in [facility] ED from 09/10/2020 in [facility]  C-SSRS RISK CATEGORY No Risk No Risk No Risk        Review of Systems:  Review of Systems  Gastrointestinal:  Negative for diarrhea.  Musculoskeletal:  Negative for gait problem.  Neurological:  Negative for tremors.  Psychiatric/Behavioral:         Please refer to HPI   Medications: I have reviewed the patient's current medications.  Current Outpatient Medications  Medication Sig Dispense Refill   ALPRAZolam (XANAX) 0.5 MG tablet Take 1 tablet (0.5 mg total) by mouth 4 (four) times daily as needed for anxiety. 120 tablet 2   atorvastatin (LIPITOR) 40 MG tablet Take 40 mg by mouth every evening.  0   b complex vitamins capsule Take 1 capsule by mouth daily.     BD PEN NEEDLE NANO 2ND GEN 32G X 4 MM MISC USE TO INJECT 5 TIMES DAILY AS DIRECTED 300 each 0   cholecalciferol (VITAMIN D3) 25 MCG (1000 UNIT) tablet Take 3,000 Units by mouth daily.     divalproex (DEPAKOTE) 250 MG DR tablet Take 2 tablets (500 mg total) by mouth at bedtime. 180 tablet 0   donepezil (ARICEPT) 10 MG tablet Take 1 tablet (10 mg total) by mouth at bedtime. 90 tablet 0   gabapentin (NEURONTIN) 600 MG tablet Take 600 mg by mouth 3 (three) times daily.     insulin glargine (LANTUS SOLOSTAR) 100 UNIT/ML Solostar Pen Inject 64 Units into the skin at bedtime. 15 mL 2   levothyroxine (SYNTHROID) 75 MCG tablet TAKE 1 TABLET BY MOUTH EVERY DAY BEFORE BREAKFAST 90 tablet 1   lithium carbonate (ESKALITH) 450 MG CR tablet Take 1 tablet (450 mg total) by mouth at bedtime. 90 tablet 0   loperamide (IMODIUM A-D) 2 MG tablet Take 2 mg by mouth 2  (two) times daily as needed for diarrhea or loose stools.     losartan (COZAAR) 25 MG tablet Take 25 mg by mouth daily.     Melatonin 10 MG TBCR Take 10 mg by mouth at bedtime.     memantine (NAMENDA) 10 MG tablet TAKE 1 TABLET(10 MG) BY MOUTH EVERY EVENING 90 tablet 0   ondansetron (ZOFRAN) 4 MG tablet Take 4 mg by mouth every 8 (eight) hours as needed for nausea or vomiting.     OZEMPIC, 1 MG/DOSE, 4 MG/3ML SOPN INJECT '1MG'$  UNDER THE SKIN ONCE WEEKLY 3 mL 2   Probiotic Product (PROBIOTIC ADVANCED PO) Take by mouth 2 (two) times daily.     QUEtiapine (SEROQUEL) 200 MG tablet TAKE 1 TAB PO q 5 pm and 1 tab po QHS 180 tablet 0   rOPINIRole (REQUIP) 0.5 MG tablet Takes 4 tabs at 5 pm and 3 tabs at bedtime and 1-2 tabs prn 720 tablet 0   glucose blood (CONTOUR NEXT TEST) test strip USE TO TEST BLOOD SUGAR TWICE DAILY 100 strip 3   Microlet Lancets MISC TEST DAILY AS DIRECTED 100 each 3   nitroGLYCERIN (NITROSTAT) 0.4 MG SL tablet Place 1 tablet (0.4 mg total) under the tongue every 5 (five) minutes as needed for chest pain. 25 tablet 2   No current facility-administered medications for this visit.    Medication Side Effects: None  Allergies:  Allergies  Allergen Reactions   Codeine Anxiety and Other (See Comments)    Hallucinations, tolerates oxycodone    Procaine Hcl Palpitations   Aspirin Nausea And Vomiting and Other (See Comments)    Reaction:  Burns pts stomach    Augmentin [Amoxicillin-Pot Clavulanate] Diarrhea   Benadryl [Diphenhydramine] Other (See Comments)    Per MD "inhibits potency of gabapentin, lithium etc"   Diflucan [Fluconazole] Other (See Comments)    Unknown reaction    Dilaudid [Hydromorphone Hcl] Other (See Comments)    Migraines and nightmares    Other Nausea And Vomiting and Other (See Comments)    Pt states that all -mycins cause N/V. (MACROLIDES)    Prednisone     Long term steroid problems with lithium and blood sugar.    Tramadol Hcl     Other  reaction(s): felt weird   Sulfa Antibiotics Other (See Comments)    Unknown reaction    Past Medical History:  Diagnosis Date   Alcohol abuse    Anxiety    takes Valium daily as needed and Ativan daily   Bilateral hearing loss    Bipolar 1 disorder ([place])    takes Lithium nightly and Synthroid daily   Chronic back pain    DDD; "all over" (09/14/2017)   Colitis, ischemic ([place]) 2012   Confusion    r/t meds   Depression    takes Prozac daily and Bupspirone    Diverticulosis    Dyslipidemia    takes Crestor daily   Fibromyalgia    Gastroparesis    Headache    "weekly" (09/14/2017)   Hepatic steatosis 06/18/12   severe   Hyperlipidemia    Hypertension    Hypothyroidism    IBS (irritable bowel syndrome)    Ischemic colitis ([place])    Joint pain    Joint swelling    Lupus erythematosus tumidus    tumid-skin   Migraine    "1-2/yr; maybe" (09/14/2017)   Numbness    in right foot   Osteoarthritis    "all over" (09/14/2017)   Osteoarthritis cervical spine    Osteoarthritis of hand    bilateral   Pneumonia    "walking pneumonia several times; long time since the last time" (09/14/2017)   Restless leg syndrome    takes Requip nightly   Sciatica    Type II diabetes mellitus ([name])    Urinary frequency    Urinary leakage    Urinary urgency    Urinary, incontinence, stress female    Walking pneumonia    last time more than 39yr ago    Past Medical History, Surgical history, Social history, and Family history were reviewed and updated as appropriate.   Please see review of systems for further details on the patient's review from today.   Objective:   Physical Exam:  There were no vitals taken for this visit.  Physical Exam Constitutional:      General: She is not in acute distress. Musculoskeletal:        General: No deformity.  Neurological:     Mental Status: She is alert and oriented to person, place, and time.     Coordination: Coordination normal.  Psychiatric:         Attention and Perception: Attention and perception normal. She does not perceive auditory or visual hallucinations.        Mood and Affect: Mood is not anxious. Affect is labile. Affect is not blunt, angry or inappropriate.  Speech: Speech normal.        Behavior: Behavior normal.        Thought Content: Thought content is not paranoid or delusional. Thought content includes homicidal ideation. Thought content does not include suicidal ideation. Thought content does not include homicidal or suicidal plan.        Cognition and Memory: Cognition and memory normal.        Judgment: Judgment normal.     Comments: Insight intact Mildly dysphoric mood    Lab Review:     Component Value Date/Time   NA 139 10/09/2020 0048   K 3.9 10/09/2020 0048   CL 104 10/09/2020 0048   CO2 27 10/09/2020 0048   GLUCOSE 130 (H) 10/09/2020 0048   BUN 11 10/09/2020 0048   CREATININE 0.98 10/09/2020 0048   CREATININE 0.85 10/09/2019 1052   CALCIUM 9.8 10/09/2020 0048   PROT 7.2 10/07/2020 1756   ALBUMIN 4.4 10/07/2020 1756   AST 15 10/07/2020 1756   ALT 15 10/07/2020 1756   ALKPHOS 91 10/07/2020 1756   BILITOT 0.5 10/07/2020 1756   GFRNONAA >60 10/09/2020 0048   GFRNONAA 57 (L) 11/17/2017 0000   GFRAA >60 12/10/2019 0657   GFRAA 67 11/17/2017 0000       Component Value Date/Time   WBC 9.5 10/09/2020 0048   RBC 4.28 10/09/2020 0048   HGB 12.3 10/09/2020 0048   HCT 39.9 10/09/2020 0048   PLT 231 10/09/2020 0048   MCV 93.2 10/09/2020 0048   MCH 28.7 10/09/2020 0048   MCHC 30.8 10/09/2020 0048   RDW 13.3 10/09/2020 0048   LYMPHSABS 2.6 10/09/2020 0048   MONOABS 0.5 10/09/2020 0048   EOSABS 0.2 10/09/2020 0048   BASOSABS 0.0 10/09/2020 0048    Lithium Lvl  Date Value Ref Range Status  10/25/2020 0.9 0.6 - 1.2 mmol/L Final     Lab Results  Component Value Date   VALPROATE 67.6 04/11/2019     .res Assessment: Plan:     Patient seen for 30 minutes and time spent discussing  recent sleep disturbance and mood signs and symptoms.  Agreed that she seems to be experiencing some mixed episodes and signs and symptoms with some impulsivity and sleeplessness, along with fatigue and low mood at times.  Discussed that lithium level during hospitalization was significantly lower compared to past levels.  Discussed that changes in hydration with chronic diarrhea and then rehydration during hospitalization could affect lithium level, as well as no longer taking Celebrex.  Recommend rechecking lithium level to determine if lithium level is within therapeutic range and if dosage adjustment in lithium is indicated.  Patient reports that she plans on having the lithium level drawn in the morning.  Discussed continuing current plan until lithium level is available.  Patient is in agreement with this plan. Patient to follow up in 1 month or sooner if clinically indicated. Patient advised to contact office with any questions, adverse effects, or acute worsening in signs and symptoms.    Salle was seen today for sleeping problem, anxiety, depression and manic behavior.  Diagnoses and all orders for this visit:  Bipolar 1 disorder ([place])  High risk medication use -     Lithium level  Generalized anxiety disorder    Please see After Visit Summary for patient specific instructions.  Future Appointments  Date Time Provider [place]  11/21/2020 11:15 AM [name], PMHNP [facility] None  12/16/2020 10:30 AM [facility] None  12/26/2020 10:45 AM Dwyane Dee,  Vicenta Aly, MD [facility] None  01/27/2021  2:00 PM Dohmeier, Asencion Partridge, MD [facility] None    Orders Placed This Encounter  Procedures   Lithium level    -------------------------------

## 2020-10-25 ENCOUNTER — Other Ambulatory Visit: Payer: Self-pay | Admitting: Psychiatry

## 2020-10-25 DIAGNOSIS — Z79899 Other long term (current) drug therapy: Secondary | ICD-10-CM | POA: Diagnosis not present

## 2020-10-26 LAB — LITHIUM LEVEL: Lithium Lvl: 0.9 mmol/L (ref 0.6–1.2)

## 2020-10-26 NOTE — Progress Notes (Signed)
Pt called to report she continues to have some manic s/s, depression, and insomnia. She reports several sleepless nights. She reports that she has been awake a few nights baking. "Don't feel like myself." She reports that she took Olanzapine 2.5 mg last night and slept some. She reports that she took Olanzapine causes nightmares. Discussed Lithium level was 0.9 which is within therapeutic range and would not recommend increasing Lithium further. Plan is to increase Olanzapine to 5 mg po QHS for several nights until sx's improve. Pt advised to call back if sx's worsen or do not improve.

## 2020-10-30 DIAGNOSIS — K582 Mixed irritable bowel syndrome: Secondary | ICD-10-CM | POA: Diagnosis not present

## 2020-10-30 DIAGNOSIS — R197 Diarrhea, unspecified: Secondary | ICD-10-CM | POA: Diagnosis not present

## 2020-10-31 DIAGNOSIS — Z1152 Encounter for screening for COVID-19: Secondary | ICD-10-CM | POA: Diagnosis not present

## 2020-10-31 DIAGNOSIS — Z20822 Contact with and (suspected) exposure to covid-19: Secondary | ICD-10-CM | POA: Diagnosis not present

## 2020-11-04 DIAGNOSIS — R197 Diarrhea, unspecified: Secondary | ICD-10-CM | POA: Diagnosis not present

## 2020-11-08 DIAGNOSIS — S0990XA Unspecified injury of head, initial encounter: Secondary | ICD-10-CM | POA: Diagnosis not present

## 2020-11-08 DIAGNOSIS — R0789 Other chest pain: Secondary | ICD-10-CM | POA: Diagnosis not present

## 2020-11-08 DIAGNOSIS — M542 Cervicalgia: Secondary | ICD-10-CM | POA: Diagnosis not present

## 2020-11-08 DIAGNOSIS — Y998 Other external cause status: Secondary | ICD-10-CM | POA: Diagnosis not present

## 2020-11-08 DIAGNOSIS — G44309 Post-traumatic headache, unspecified, not intractable: Secondary | ICD-10-CM | POA: Diagnosis not present

## 2020-11-09 ENCOUNTER — Other Ambulatory Visit: Payer: Self-pay | Admitting: Endocrinology

## 2020-11-19 DIAGNOSIS — R519 Headache, unspecified: Secondary | ICD-10-CM | POA: Diagnosis not present

## 2020-11-19 DIAGNOSIS — M549 Dorsalgia, unspecified: Secondary | ICD-10-CM | POA: Diagnosis not present

## 2020-11-19 DIAGNOSIS — M791 Myalgia, unspecified site: Secondary | ICD-10-CM | POA: Diagnosis not present

## 2020-11-19 DIAGNOSIS — Z23 Encounter for immunization: Secondary | ICD-10-CM | POA: Diagnosis not present

## 2020-11-21 ENCOUNTER — Ambulatory Visit: Payer: Medicare Other | Admitting: Psychiatry

## 2020-11-29 ENCOUNTER — Other Ambulatory Visit: Payer: Self-pay

## 2020-11-29 ENCOUNTER — Telehealth: Payer: Self-pay | Admitting: Endocrinology

## 2020-11-29 DIAGNOSIS — Z794 Long term (current) use of insulin: Secondary | ICD-10-CM

## 2020-11-29 DIAGNOSIS — E1165 Type 2 diabetes mellitus with hyperglycemia: Secondary | ICD-10-CM

## 2020-11-29 DIAGNOSIS — G2581 Restless legs syndrome: Secondary | ICD-10-CM

## 2020-11-29 MED ORDER — OZEMPIC (1 MG/DOSE) 4 MG/3ML ~~LOC~~ SOPN: PEN_INJECTOR | SUBCUTANEOUS | 2 refills | Status: DC

## 2020-11-29 MED ORDER — LANTUS SOLOSTAR 100 UNIT/ML ~~LOC~~ SOPN
64.0000 [IU] | PEN_INJECTOR | Freq: Every day | SUBCUTANEOUS | 2 refills | Status: DC
Start: 2020-11-29 — End: 2021-01-13

## 2020-11-29 MED ORDER — ROPINIROLE HCL 0.5 MG PO TABS: ORAL_TABLET | ORAL | 0 refills | Status: DC

## 2020-11-29 NOTE — Telephone Encounter (Signed)
Pt needs refill of Ozempic until next appt. Pt number [phone]

## 2020-11-29 NOTE — Telephone Encounter (Signed)
Rx sent to pharmacy   

## 2020-12-03 ENCOUNTER — Other Ambulatory Visit: Payer: Self-pay

## 2020-12-03 ENCOUNTER — Emergency Department (HOSPITAL_BASED_OUTPATIENT_CLINIC_OR_DEPARTMENT_OTHER)
Admission: EM | Admit: 2020-12-03 | Discharge: 2020-12-03 | Disposition: A | Discharge status: Home / Self Care | Payer: Medicare Other | Attending: Emergency Medicine | Admitting: Emergency Medicine

## 2020-12-03 ENCOUNTER — Encounter: Payer: Self-pay | Admitting: Psychiatry

## 2020-12-03 ENCOUNTER — Telehealth: Payer: Self-pay | Admitting: Endocrinology

## 2020-12-03 ENCOUNTER — Encounter (HOSPITAL_BASED_OUTPATIENT_CLINIC_OR_DEPARTMENT_OTHER): Payer: Self-pay | Admitting: *Deleted

## 2020-12-03 ENCOUNTER — Ambulatory Visit (INDEPENDENT_AMBULATORY_CARE_PROVIDER_SITE_OTHER): Payer: Medicare Other | Admitting: Psychiatry

## 2020-12-03 DIAGNOSIS — Z96641 Presence of right artificial hip joint: Secondary | ICD-10-CM | POA: Diagnosis not present

## 2020-12-03 DIAGNOSIS — Z79899 Other long term (current) drug therapy: Secondary | ICD-10-CM | POA: Insufficient documentation

## 2020-12-03 DIAGNOSIS — F5101 Primary insomnia: Secondary | ICD-10-CM

## 2020-12-03 DIAGNOSIS — E119 Type 2 diabetes mellitus without complications: Secondary | ICD-10-CM | POA: Insufficient documentation

## 2020-12-03 DIAGNOSIS — Z96652 Presence of left artificial knee joint: Secondary | ICD-10-CM | POA: Diagnosis not present

## 2020-12-03 DIAGNOSIS — Z794 Long term (current) use of insulin: Secondary | ICD-10-CM | POA: Diagnosis not present

## 2020-12-03 DIAGNOSIS — I10 Essential (primary) hypertension: Secondary | ICD-10-CM | POA: Insufficient documentation

## 2020-12-03 DIAGNOSIS — N390 Urinary tract infection, site not specified: Secondary | ICD-10-CM | POA: Insufficient documentation

## 2020-12-03 DIAGNOSIS — F319 Bipolar disorder, unspecified: Secondary | ICD-10-CM | POA: Diagnosis not present

## 2020-12-03 DIAGNOSIS — E039 Hypothyroidism, unspecified: Secondary | ICD-10-CM | POA: Insufficient documentation

## 2020-12-03 DIAGNOSIS — R350 Frequency of micturition: Secondary | ICD-10-CM | POA: Diagnosis present

## 2020-12-03 LAB — URINALYSIS, ROUTINE W REFLEX MICROSCOPIC
Bilirubin Urine: NEGATIVE
Glucose, UA: NEGATIVE mg/dL
Hgb urine dipstick: NEGATIVE
Ketones, ur: NEGATIVE mg/dL
Nitrite: NEGATIVE
Protein, ur: NEGATIVE mg/dL
Specific Gravity, Urine: 1.005 (ref 1.005–1.030)
pH: 7 (ref 5.0–8.0)

## 2020-12-03 MED ORDER — MEMANTINE HCL 10 MG PO TABS: ORAL_TABLET | ORAL | 0 refills | Status: DC

## 2020-12-03 MED ORDER — CEPHALEXIN 250 MG PO CAPS
500.0000 mg | ORAL_CAPSULE | Freq: Once | ORAL | Status: AC
  Administered 2020-12-03: 500 mg via ORAL
  Filled 2020-12-03: qty 2

## 2020-12-03 MED ORDER — CEPHALEXIN 500 MG PO CAPS: 500.0000 mg | ORAL_CAPSULE | Freq: Four times a day (QID) | ORAL | 0 refills | Status: DC

## 2020-12-03 MED ORDER — LITHIUM CARBONATE ER 450 MG PO TBCR: 450.0000 mg | EXTENDED_RELEASE_TABLET | Freq: Every day | ORAL | 0 refills | Status: DC

## 2020-12-03 MED ORDER — QUETIAPINE FUMARATE 200 MG PO TABS: ORAL_TABLET | ORAL | 0 refills | Status: DC

## 2020-12-03 MED ORDER — PHENAZOPYRIDINE HCL 100 MG PO TABS
200.0000 mg | ORAL_TABLET | Freq: Once | ORAL | Status: AC
  Administered 2020-12-03: 200 mg via ORAL
  Filled 2020-12-03: qty 2

## 2020-12-03 MED ORDER — DONEPEZIL HCL 10 MG PO TABS: 10.0000 mg | ORAL_TABLET | Freq: Every day | ORAL | 0 refills | Status: DC

## 2020-12-03 MED ORDER — DIVALPROEX SODIUM 250 MG PO DR TAB: 500.0000 mg | DELAYED_RELEASE_TABLET | Freq: Every day | ORAL | 0 refills | Status: DC

## 2020-12-03 NOTE — Telephone Encounter (Signed)
Pt calling in regards to Refill of Ozempic 2mg , completely out. [facility] on [address] Pt contact: [phone]
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
## 2020-12-03 NOTE — ED Provider Notes (Signed)
[facility] EMERGENCY DEPT Provider Note   CSN: [id number] Arrival date & time: 12/03/20  3335     History Chief Complaint  Patient presents with  . Urinary Tract Infection  . Urinary Frequency    [name] [name] is a 75 y.o. female.  Pt with urinary frequency in the past couple days and concerned possible uti. Symptoms acute onset, moderate, persistent. Some suprapubic cramping/discomfort. Denies back or flank pain. No fever or chills. No vaginal discharge or bleeding. Notes hx uti.   The history is provided by the patient and medical records.  Urinary Tract Infection Associated symptoms: no abdominal pain, no fever, no flank pain and no vomiting   Urinary Frequency Pertinent negatives include no chest pain, no abdominal pain, no headaches and no shortness of breath.      Past Medical History:  Diagnosis Date  . Alcohol abuse   . Anxiety    takes Valium daily as needed and Ativan daily  . Bilateral hearing loss   . Bipolar 1 disorder ([place])    takes Lithium nightly and Synthroid daily  . Chronic back pain    DDD; "all over" (09/14/2017)  . Colitis, ischemic ([place]) 2012  . Confusion    r/t meds  . Depression    takes Prozac daily and Bupspirone   . Diverticulosis   . Dyslipidemia    takes Crestor daily  . Fibromyalgia   . Gastroparesis   . Headache    "weekly" (09/14/2017)  . Hepatic steatosis 06/18/12   severe  . Hyperlipidemia   . Hypertension   . Hypothyroidism   . IBS (irritable bowel syndrome)   . Ischemic colitis ([place])   . Joint pain   . Joint swelling   . Lupus erythematosus tumidus    tumid-skin  . Migraine    "1-2/yr; maybe" (09/14/2017)  . Numbness    in right foot  . Osteoarthritis    "all over" (09/14/2017)  . Osteoarthritis cervical spine   . Osteoarthritis of hand    bilateral  . Pneumonia    "walking pneumonia several times; long time since the last time" (09/14/2017)  . Restless leg syndrome    takes Requip nightly  .  Sciatica   . Type II diabetes mellitus ([place])   . Urinary frequency   . Urinary leakage   . Urinary urgency   . Urinary, incontinence, stress female   . Walking pneumonia    last time more than 30yrs ago    Patient Active Problem List   Diagnosis Date Noted  . Intractable abdominal pain 07/10/2020  . Pancreatic abnormality 07/10/2020  . Diabetes mellitus type 2 in obese ([place]) 07/10/2020  . Essential hypertension 07/10/2020  . Bipolar 1 disorder, mixed, moderate ([place])   . Suicidal ideations   . Overweight (BMI 25.0-29.9) 02/08/2019  . S/P left TKA 02/07/2019  . Chest pain   . Chest pain with normal coronary angiography   . Diverticulitis 11/19/2016  . Hypothyroidism 07/14/2016  . Diarrhea   . Generalized abdominal pain   . Major depressive disorder, recurrent severe without psychotic features ([place]) 12/31/2015  . Nausea without vomiting   . Colitis 10/28/2015  . Unintentional poisoning by psychotropic drug 07/05/2015  . Restless leg syndrome 07/05/2015  . Diabetic polyneuropathy associated with type 2 diabetes mellitus ([place]) 09/21/2014  . Arthritis of left hip 06/09/2013  . Arthritis of right hip 06/08/2013  . Hip pain, right 05/02/2013  . IBS (irritable bowel syndrome) 01/02/2013  . Unspecified vitamin D  deficiency 07/05/2012  . Pure hyperglyceridemia 07/04/2012  . Diabetes mellitus with diabetic neuropathy, without long-term current use of insulin ([place]) 06/18/2012  . Hypertension 06/18/2012  . Bipolar 1 disorder ([place]) 06/18/2012  . Low back pain 02/25/2011  . Acute ischemic colitis ([place]) 01/06/2011  . Sciatica 12/19/2010  . Fibromyalgia 10/28/2010  . Primary osteoarthritis of right hip 10/24/2010  . Hyperlipidemia with target low density lipoprotein (LDL) cholesterol less than 100 mg/dL 05/05/2010  . Anxiety and depression 05/05/2010  . ABUSE, ALCOHOL, IN REMISSION 05/05/2010  . OTITIS MEDIA, CHRONIC 05/05/2010  . Hearing loss 05/05/2010  . ALLERGIC RHINITIS,  SEASONAL, MILD 05/05/2010  . URINARY INCONTINENCE, STRESS, FEMALE 05/05/2010  . OSTEOARTHRITIS, HANDS, BILATERAL 05/05/2010  . OSTEOARTHRITIS, CERVICAL SPINE 05/05/2010    Past Surgical History:  Procedure Laterality Date  . ABDOMINAL HYSTERECTOMY     "they left my ovaries"  . APPENDECTOMY    . BALLOON DILATION N/A 06/14/2020   Procedure: BALLOON DILATION;  Surgeon: Ronnette Juniper, MD;  Location: [facility];  Service: Gastroenterology;  Laterality: N/A;  . BIOPSY  06/14/2020   Procedure: BIOPSY;  Surgeon: Ronnette Juniper, MD;  Location: [facility];  Service: Gastroenterology;;  . CARDIAC CATHETERIZATION  09/14/2017  . COLONOSCOPY    . DENTAL SURGERY Left 10/2016   dental implant  . DILATION AND CURETTAGE OF UTERUS  X 4  . ESOPHAGOGASTRODUODENOSCOPY    . ESOPHAGOGASTRODUODENOSCOPY (EGD) WITH PROPOFOL N/A 06/14/2020   Procedure: ESOPHAGOGASTRODUODENOSCOPY (EGD) WITH PROPOFOL;  Surgeon: Ronnette Juniper, MD;  Location: [facility];  Service: Gastroenterology;  Laterality: N/A;  . FLEXIBLE SIGMOIDOSCOPY N/A 06/21/2012   Procedure: FLEXIBLE SIGMOIDOSCOPY;  Surgeon: Jerene Bears, MD;  Location: [facility];  Service: Gastroenterology;  Laterality: N/A;  . JOINT REPLACEMENT    . LEFT HEART CATH AND CORONARY ANGIOGRAPHY N/A 09/14/2017   Procedure: LEFT HEART CATH AND CORONARY ANGIOGRAPHY;  Surgeon: Belva Crome, MD;  Location: [facility];  Service: Cardiovascular;  Laterality: N/A;  . SHOULDER ARTHROSCOPY Right    "shaved spurs off rotator cuff"  . TONSILLECTOMY    . TOTAL HIP ARTHROPLASTY Right 06/09/2013   Procedure: TOTAL HIP ARTHROPLASTY;  Surgeon: Kerin Salen, MD;  Location: [facility];  Service: Orthopedics;  Laterality: Right;  . TOTAL KNEE ARTHROPLASTY Left 02/07/2019   Procedure: TOTAL KNEE ARTHROPLASTY;  Surgeon: Paralee Cancel, MD;  Location: [facility];  Service: Orthopedics;  Laterality: Left;  70 mins  . TUBAL LIGATION    . TUMOR EXCISION Right 1968   angle of jaw; benign     OB History      Gravida  2   Para  2   Term      Preterm      AB      Living         SAB      IAB      Ectopic      Multiple      Live Births              Family History  Problem Relation Age of Onset  . Drug abuse Mother   . Alcohol abuse Mother   . Alcohol abuse Father   . Hypertension Father   . CAD Brother   . Hypertension Brother   . Alcohol abuse Brother   . Hypertension Brother   . Hypertension Brother   . Psoriasis Daughter   . Arthritis Daughter        psoriatic arthritis   . Alcohol  abuse Grandchild     Social History   Tobacco Use  . Smoking status: Never  . Smokeless tobacco: Never  Vaping Use  . Vaping Use: Never used  Substance Use Topics  . Alcohol use: Not Currently    Comment: 09/14/2017 "nothing since 04/15/2008"  . Drug use: Not Currently    Frequency: 7.0 times per week    Types: Benzodiazepines    Home Medications Prior to Admission medications   Medication Sig Start Date End Date Taking? Authorizing Provider  ALPRAZolam Duanne Moron) 0.5 MG tablet Take 1 tablet (0.5 mg total) by mouth 4 (four) times daily as needed for anxiety. 09/26/20   [name], PMHNP  atorvastatin (LIPITOR) 40 MG tablet Take 40 mg by mouth every evening.    [provider]  b complex vitamins capsule Take 1 capsule by mouth daily.    [provider]  BD PEN NEEDLE NANO 2ND GEN 32G X 4 MM MISC USE TO INJECT 5 TIMES DAILY AS DIRECTED 09/20/20   Elayne Snare, MD  cholecalciferol (VITAMIN D3) 25 MCG (1000 UNIT) tablet Take 3,000 Units by mouth daily.    [provider]  divalproex (DEPAKOTE) 250 MG DR tablet Take 2 tablets (500 mg total) by mouth at bedtime. 08/07/20   [name], PMHNP  donepezil (ARICEPT) 10 MG tablet Take 1 tablet (10 mg total) by mouth at bedtime. 08/07/20   [name], PMHNP  gabapentin (NEURONTIN) 600 MG tablet Take 600 mg by mouth 3 (three) times daily. 05/12/19   [provider]  glucose blood (CONTOUR NEXT  TEST) test strip USE TO TEST BLOOD SUGAR TWICE DAILY 06/20/20   Elayne Snare, MD  insulin glargine (LANTUS SOLOSTAR) 100 UNIT/ML Solostar Pen Inject 64 Units into the skin at bedtime. 11/29/20   Elayne Snare, MD  levothyroxine (SYNTHROID) 75 MCG tablet TAKE 1 TABLET BY MOUTH EVERY DAY BEFORE BREAKFAST 02/22/20   Elayne Snare, MD  lithium carbonate (ESKALITH) 450 MG CR tablet Take 1 tablet (450 mg total) by mouth at bedtime. 08/07/20   [name], PMHNP  loperamide (IMODIUM A-D) 2 MG tablet Take 2 mg by mouth 2 (two) times daily as needed for diarrhea or loose stools.    [provider]  losartan (COZAAR) 25 MG tablet Take 25 mg by mouth daily. 08/31/19   [provider]  Melatonin 10 MG TBCR Take 10 mg by mouth at bedtime.    [provider]  memantine (NAMENDA) 10 MG tablet TAKE 1 TABLET(10 MG) BY MOUTH EVERY EVENING 08/07/20   [name], PMHNP  Microlet Lancets MISC TEST DAILY AS DIRECTED 06/20/20   Elayne Snare, MD  nitroGLYCERIN (NITROSTAT) 0.4 MG SL tablet Place 1 tablet (0.4 mg total) under the tongue every 5 (five) minutes as needed for chest pain. 09/14/17   Lyda Jester M, PA-C  ondansetron (ZOFRAN) 4 MG tablet Take 4 mg by mouth every 8 (eight) hours as needed for nausea or vomiting. 11/26/19   [provider]  Probiotic Product (PROBIOTIC ADVANCED PO) Take by mouth 2 (two) times daily.    [provider]  QUEtiapine (SEROQUEL) 200 MG tablet TAKE 1 TAB PO q 5 pm and 1 tab po QHS 08/07/20   [name], PMHNP  rOPINIRole (REQUIP) 0.5 MG tablet Takes 4 tabs at 5 pm and 3 tabs at bedtime and 1-2 tabs prn 11/29/20   [name], PMHNP  Semaglutide, 1 MG/DOSE, (OZEMPIC, 1 MG/DOSE,) 4 MG/3ML SOPN INJECT 1MG  UNDER THE SKIN ONCE WEEKLY  11/29/20   Elayne Snare, MD    Allergies    Codeine, Procaine hcl, Aspirin, Augmentin [amoxicillin-pot clavulanate], Benadryl [diphenhydramine], Diflucan [fluconazole], Dilaudid [hydromorphone hcl], Other,  Prednisone, Tramadol hcl, and Sulfa antibiotics  Review of Systems   Review of Systems  Constitutional:  Negative for chills and fever.  HENT:  Negative for sore throat.   Eyes:  Negative for redness.  Respiratory:  Negative for shortness of breath.   Cardiovascular:  Negative for chest pain.  Gastrointestinal:  Negative for abdominal pain, diarrhea and vomiting.  Endocrine: Negative for polydipsia.  Genitourinary:  Positive for frequency. Negative for flank pain.  Musculoskeletal:  Negative for back pain and neck pain.  Skin:  Negative for rash.  Neurological:  Negative for headaches.  Hematological:  Does not bruise/bleed easily.  Psychiatric/Behavioral:  Negative for confusion.    Physical Exam Updated Vital Signs BP 122/63 (BP Location: Right Arm)   Pulse 84   Temp (!) 97.5 F (36.4 C) (Oral)   Resp 16   Ht 1.575 m (5\' 2" )   Wt 82.4 kg   SpO2 98%   BMI 33.23 kg/m   Physical Exam Vitals and nursing note reviewed.  Constitutional:      Appearance: Normal appearance. She is well-developed.  HENT:     Head: Atraumatic.     Nose: Nose normal.     Mouth/Throat:     Mouth: Mucous membranes are moist.  Eyes:     General: No scleral icterus.    Conjunctiva/sclera: Conjunctivae normal.  Neck:     Trachea: No tracheal deviation.  Cardiovascular:     Rate and Rhythm: Normal rate.     Pulses: Normal pulses.  Pulmonary:     Effort: Pulmonary effort is normal. No respiratory distress.  Abdominal:     General: Bowel sounds are normal. There is no distension.     Palpations: Abdomen is soft. There is no mass.     Tenderness: There is no abdominal tenderness. There is no guarding.  Genitourinary:    Comments: No cva tenderness.  Musculoskeletal:        General: No swelling.     Cervical back: Normal range of motion and neck supple. No rigidity. No muscular tenderness.  Skin:    General: Skin is warm and dry.     Findings: No rash.  Neurological:     Mental Status: She  is alert.     Comments: Alert, speech normal.   Psychiatric:        Mood and Affect: Mood normal.    ED Results / Procedures / Treatments   Labs (all labs ordered are listed, but only abnormal results are displayed) Results for orders placed or performed during the hospital encounter of 12/03/20  Urinalysis, Routine w reflex microscopic Urine, Clean Catch  Result Value Ref Range   Color, Urine YELLOW YELLOW   APPearance CLEAR CLEAR   Specific Gravity, Urine 1.005 1.005 - 1.030   pH 7.0 5.0 - 8.0   Glucose, UA NEGATIVE NEGATIVE mg/dL   Hgb urine dipstick NEGATIVE NEGATIVE   Bilirubin Urine NEGATIVE NEGATIVE   Ketones, ur NEGATIVE NEGATIVE mg/dL   Protein, ur NEGATIVE NEGATIVE mg/dL   Nitrite NEGATIVE NEGATIVE   Leukocytes,Ua MODERATE (A) NEGATIVE   RBC / HPF 0-5 0 - 5 RBC/hpf   WBC, UA 21-50 0 - 5 WBC/hpf   Bacteria, UA FEW (A) NONE SEEN   Squamous Epithelial / LPF 0-5 0 - 5   Mucus PRESENT  Non Squamous Epithelial 0-5 (A) NONE SEEN    EKG None  Radiology No results found.  Procedures Procedures   Medications Ordered in ED Medications - No data to display  ED Course  I have reviewed the triage vital signs and the nursing notes.  Pertinent labs & imaging results that were available during my care of the patient were reviewed by me and considered in my medical decision making (see chart for details).    MDM Rules/Calculators/A&P                           Labs sent.   Reviewed nursing notes and prior charts for additional history.   Labs reviewed/interpreted by me - UA w LE positive, 21-50 wbc.   Pyridium po, keflex po.   Pt appears stable for d/c.    Final Clinical Impression(s) / ED Diagnoses Final diagnoses:  None    Rx / DC Orders ED Discharge Orders     None        Lajean Saver, MD 12/03/20 [phone]

## 2020-12-03 NOTE — ED Triage Notes (Addendum)
Patient stated that she tested herself at home for UTI, positive.  Also stated that she has frequent urination.

## 2020-12-03 NOTE — ED Notes (Signed)
Patient verbalizes understanding of discharge instructions. Opportunity for questioning and answers were provided. Patient discharged from ED.  °

## 2020-12-03 NOTE — Discharge Instructions (Addendum)
It was our pleasure to provide your ER care today - we hope that you feel better.  Drink plenty of fluids/stay well hydrated.  Take keflex (antibiotic) as prescribed.   Follow up with primary care doctor in 1 week if symptoms fail to improve/resolve.  Return to ER if worse, new symptoms, fevers, severe pain, persistent vomiting, or other concern.

## 2020-12-12 DIAGNOSIS — R109 Unspecified abdominal pain: Secondary | ICD-10-CM | POA: Diagnosis not present

## 2020-12-12 DIAGNOSIS — N3000 Acute cystitis without hematuria: Secondary | ICD-10-CM | POA: Diagnosis not present

## 2020-12-14 DIAGNOSIS — Z20822 Contact with and (suspected) exposure to covid-19: Secondary | ICD-10-CM | POA: Diagnosis not present

## 2020-12-16 ENCOUNTER — Other Ambulatory Visit: Payer: Medicare Other

## 2020-12-16 DIAGNOSIS — M25552 Pain in left hip: Secondary | ICD-10-CM | POA: Diagnosis not present

## 2020-12-16 DIAGNOSIS — M545 Low back pain, unspecified: Secondary | ICD-10-CM | POA: Diagnosis not present

## 2020-12-20 DIAGNOSIS — M25572 Pain in left ankle and joints of left foot: Secondary | ICD-10-CM | POA: Diagnosis not present

## 2020-12-23 DIAGNOSIS — Z23 Encounter for immunization: Secondary | ICD-10-CM | POA: Diagnosis not present

## 2020-12-24 ENCOUNTER — Other Ambulatory Visit: Payer: Self-pay

## 2020-12-24 ENCOUNTER — Other Ambulatory Visit (INDEPENDENT_AMBULATORY_CARE_PROVIDER_SITE_OTHER): Payer: Medicare Other

## 2020-12-24 DIAGNOSIS — E1165 Type 2 diabetes mellitus with hyperglycemia: Secondary | ICD-10-CM | POA: Diagnosis not present

## 2020-12-24 DIAGNOSIS — R21 Rash and other nonspecific skin eruption: Secondary | ICD-10-CM | POA: Diagnosis not present

## 2020-12-24 DIAGNOSIS — R350 Frequency of micturition: Secondary | ICD-10-CM | POA: Diagnosis not present

## 2020-12-24 DIAGNOSIS — M545 Low back pain, unspecified: Secondary | ICD-10-CM | POA: Diagnosis not present

## 2020-12-24 DIAGNOSIS — E039 Hypothyroidism, unspecified: Secondary | ICD-10-CM

## 2020-12-24 DIAGNOSIS — Z794 Long term (current) use of insulin: Secondary | ICD-10-CM

## 2020-12-24 LAB — COMPREHENSIVE METABOLIC PANEL
ALT: 16 U/L (ref 0–35)
AST: 17 U/L (ref 0–37)
Albumin: 4.2 g/dL (ref 3.5–5.2)
Alkaline Phosphatase: 106 U/L (ref 39–117)
BUN: 16 mg/dL (ref 6–23)
CO2: 27 mEq/L (ref 19–32)
Calcium: 10.3 mg/dL (ref 8.4–10.5)
Chloride: 103 mEq/L (ref 96–112)
Creatinine, Ser: 0.98 mg/dL (ref 0.40–1.20)
GFR: 57.49 mL/min — ABNORMAL LOW (ref >60.00)
Glucose, Bld: 178 mg/dL — ABNORMAL HIGH (ref 70–99)
Potassium: 4.5 mEq/L (ref 3.5–5.1)
Sodium: 137 mEq/L (ref 135–145)
Total Bilirubin: 0.4 mg/dL (ref 0.2–1.2)
Total Protein: 7.1 g/dL (ref 6.0–8.3)

## 2020-12-24 LAB — T4, FREE: Free T4: 0.9 ng/dL (ref 0.60–1.60)

## 2020-12-24 LAB — TSH: TSH: 1.27 u[IU]/mL (ref 0.35–5.50)

## 2020-12-24 LAB — T3, FREE: T3, Free: 2.2 pg/mL — ABNORMAL LOW (ref 2.3–4.2)

## 2020-12-24 LAB — HEMOGLOBIN A1C: Hgb A1c MFr Bld: 6.8 % — ABNORMAL HIGH (ref 4.6–6.5)

## 2020-12-26 ENCOUNTER — Other Ambulatory Visit: Payer: Self-pay

## 2020-12-26 ENCOUNTER — Ambulatory Visit (INDEPENDENT_AMBULATORY_CARE_PROVIDER_SITE_OTHER): Payer: Medicare Other | Admitting: Endocrinology

## 2020-12-26 ENCOUNTER — Encounter: Payer: Self-pay | Admitting: Endocrinology

## 2020-12-26 VITALS — BP 124/82 | HR 91 | Ht 62.0 in | Wt 180.0 lb

## 2020-12-26 DIAGNOSIS — E1165 Type 2 diabetes mellitus with hyperglycemia: Secondary | ICD-10-CM

## 2020-12-26 DIAGNOSIS — E1142 Type 2 diabetes mellitus with diabetic polyneuropathy: Secondary | ICD-10-CM | POA: Diagnosis not present

## 2020-12-26 DIAGNOSIS — Z794 Long term (current) use of insulin: Secondary | ICD-10-CM | POA: Diagnosis not present

## 2020-12-26 DIAGNOSIS — E039 Hypothyroidism, unspecified: Secondary | ICD-10-CM

## 2020-12-26 MED ORDER — LIOTHYRONINE SODIUM 5 MCG PO TABS: 5.0000 ug | ORAL_TABLET | Freq: Every day | ORAL | 1 refills | Status: DC

## 2020-12-26 MED ORDER — OZEMPIC (2 MG/DOSE) 8 MG/3ML ~~LOC~~ SOPN: PEN_INJECTOR | SUBCUTANEOUS | 1 refills | Status: DC

## 2020-12-26 MED ORDER — LEVOTHYROXINE SODIUM 75 MCG PO TABS: ORAL_TABLET | ORAL | 1 refills | Status: DC

## 2020-12-26 NOTE — Progress Notes (Signed)
Patient ID: [name] [name], female   DOB: [birth year], 75 y.o.   MRN: [id number]            Reason for Appointment: Follow-up for Type 2 Diabetes   History of Present Illness:          Date of diagnosis of type 2 diabetes mellitus: ?  2014        Background history:   She thinks her blood sugar was 400 at the time of diagnosis but no detailed records of this are available She did have an A1c of 10.6 done in 2014 and was probably given Lantus for some time initially Apparently she was treated with various medications including metformin, Janumet and Tradgenta. Her blood sugars had improved and A1c in 2015 was down to 6.2 She tends to have diarrhea with metformin and Janumet and also she thinks it causes dry mouth Most likely Janumet was stopped in 06/2015 Glipizide was started in 8/17 when blood sugars were higher and A1c was 9%  Recent history:   INSULIN regimen is:   Lantus 64 units daily at 5 pm     Non-insulin hypoglycemic drugs the patient is taking are: 1 mg Ozempic weekly  Her A1c is 6.8, was 6.6   Current management, blood sugar patterns and problems identified:  She appears to be not checking her blood sugars before dinner instead of at various times  Not clear if she is having high readings after meals consistently, lab glucose 178 after breakfast  However she has only 1 fasting reading which was 164  Blood sugars in the late afternoon are sometimes high but before dinnertime may be as low as 116  Previously was having difficulty controlling her snacks especially overnight Because of various physical limitations she is not able to do any activities such as walking for exercise  Has not lost any weight  Side effects from medications have been:?  Diarrhea from metformin and Janumet  Glucose monitoring:  done 1 time a day or less        Glucometer: Contour        Blood Glucose readings    PRE-MEAL Fasting Lunch Dinner Bedtime Overall  Glucose range: 164  116-179     Mean/median:   145     POST-MEAL PC Breakfast PC Lunch PC Dinner  Glucose range:  164, 203   Mean/median:       Previously  PRE-MEAL Fasting Lunch Dinner Bedtime Overall  Glucose range: 136-190      Mean/median: 160    150   POST-MEAL PC Breakfast PC Lunch PC Dinner  Glucose range:  92-144   Mean/median:        Self-care: The diet that the patient has been following [id number], tries to limit drinks with sugar .      Typical meal intake: Breakfast at 9,  May be eggs and sausage and lunch is usually a sandwich   Dinner 7 pm               Dietician visit, most recent: 10/18                Weight history:   Wt Readings from Last 3 Encounters:  12/26/20 180 lb (81.6 kg)  12/03/20 181 lb 10.5 oz (82.4 kg)  10/15/20 181 lb 9.6 oz (82.4 kg)    Glycemic control:   Lab Results  Component Value Date   HGBA1C 6.8 (H) 12/24/2020   HGBA1C 6.6 (H) 10/08/2020   HGBA1C  6.8 (A) 06/20/2020   Lab Results  Component Value Date   MICROALBUR <0.7 08/09/2020   LDLCALC 79 08/09/2020   CREATININE 0.98 12/24/2020   Lab Results  Component Value Date   MICRALBCREAT 1.1 08/09/2020       Allergies as of 12/26/2020       Reactions   Codeine Anxiety, Other (See Comments)   Hallucinations, tolerates oxycodone    Procaine Hcl Palpitations   Aspirin Nausea And Vomiting, Other (See Comments)   Reaction:  Burns pts stomach    Augmentin [amoxicillin-pot Clavulanate] Diarrhea   Benadryl [diphenhydramine] Other (See Comments)   Per MD "inhibits potency of gabapentin, lithium etc"   Diflucan [fluconazole] Other (See Comments)   Unknown reaction   Dilaudid [hydromorphone Hcl] Other (See Comments)   Migraines and nightmares    Other Nausea And Vomiting, Other (See Comments)   Pt states that all -mycins cause N/V. (MACROLIDES)    Prednisone    Long term steroid problems with lithium and blood sugar.    Tramadol Hcl    Other reaction(s): felt weird   Sulfa Antibiotics Other (See  Comments)   Unknown reaction        Medication List        Accurate as of December 26, 2020 11:34 AM. If you have any questions, ask your nurse or doctor.          STOP taking these medications    Ozempic (1 MG/DOSE) 4 MG/3ML Sopn Generic drug: Semaglutide (1 MG/DOSE) Replaced by: Ozempic (2 MG/DOSE) 8 MG/3ML Sopn Stopped by: Elayne Snare, MD       TAKE these medications    ALPRAZolam 0.5 MG tablet Commonly known as: XANAX Take 1 tablet (0.5 mg total) by mouth 4 (four) times daily as needed for anxiety.   atorvastatin 40 MG tablet Commonly known as: LIPITOR Take 40 mg by mouth every evening.   b complex vitamins capsule Take 1 capsule by mouth daily.   BD Pen Needle Nano 2nd Gen 32G X 4 MM Misc Generic drug: Insulin Pen Needle USE TO INJECT 5 TIMES DAILY AS DIRECTED   cephALEXin 500 MG capsule Commonly known as: Keflex Take 1 capsule (500 mg total) by mouth 4 (four) times daily.   cholecalciferol 25 MCG (1000 UNIT) tablet Commonly known as: VITAMIN D3 Take 3,000 Units by mouth daily.   Contour Next Test test strip Generic drug: glucose blood USE TO TEST BLOOD SUGAR TWICE DAILY   divalproex 250 MG DR tablet Commonly known as: DEPAKOTE Take 2 tablets (500 mg total) by mouth at bedtime.   donepezil 10 MG tablet Commonly known as: ARICEPT Take 1 tablet (10 mg total) by mouth at bedtime.   gabapentin 600 MG tablet Commonly known as: NEURONTIN Take 600 mg by mouth 3 (three) times daily.   Lantus SoloStar 100 UNIT/ML Solostar Pen Generic drug: insulin glargine Inject 64 Units into the skin at bedtime.   levothyroxine 75 MCG tablet Commonly known as: SYNTHROID TAKE 1 TABLET BY MOUTH EVERY DAY BEFORE BREAKFAST   liothyronine 5 MCG tablet Commonly known as: Cytomel Take 1 tablet (5 mcg total) by mouth daily. Take with levothyroxine before breakfast Started by: Elayne Snare, MD   lithium carbonate 450 MG CR tablet Commonly known as: ESKALITH Take 1  tablet (450 mg total) by mouth at bedtime.   loperamide 2 MG tablet Commonly known as: IMODIUM A-D Take 2 mg by mouth 2 (two) times daily as needed for diarrhea or loose stools.  losartan 25 MG tablet Commonly known as: COZAAR Take 25 mg by mouth daily.   Melatonin 10 MG Tbcr Take 10 mg by mouth at bedtime.   memantine 10 MG tablet Commonly known as: NAMENDA TAKE 1 TABLET(10 MG) BY MOUTH EVERY EVENING   Microlet Lancets Misc TEST DAILY AS DIRECTED   nitroGLYCERIN 0.4 MG SL tablet Commonly known as: NITROSTAT Place 1 tablet (0.4 mg total) under the tongue every 5 (five) minutes as needed for chest pain.   ondansetron 4 MG tablet Commonly known as: ZOFRAN Take 4 mg by mouth every 8 (eight) hours as needed for nausea or vomiting.   Ozempic (2 MG/DOSE) 8 MG/3ML Sopn Generic drug: Semaglutide (2 MG/DOSE) Inject 2 mg weekly Replaces: Ozempic (1 MG/DOSE) 4 MG/3ML Sopn Started by: Elayne Snare, MD   PROBIOTIC ADVANCED PO Take by mouth 2 (two) times daily.   QUEtiapine 200 MG tablet Commonly known as: SEROQUEL TAKE 1 TAB PO q 5 pm and 1 tab po QHS   rOPINIRole 0.5 MG tablet Commonly known as: REQUIP Takes 4 tabs at 5 pm and 3 tabs at bedtime and 1-2 tabs prn        Allergies:  Allergies  Allergen Reactions   Codeine Anxiety and Other (See Comments)    Hallucinations, tolerates oxycodone    Procaine Hcl Palpitations   Aspirin Nausea And Vomiting and Other (See Comments)    Reaction:  Burns pts stomach    Augmentin [Amoxicillin-Pot Clavulanate] Diarrhea   Benadryl [Diphenhydramine] Other (See Comments)    Per MD "inhibits potency of gabapentin, lithium etc"   Diflucan [Fluconazole] Other (See Comments)    Unknown reaction    Dilaudid [Hydromorphone Hcl] Other (See Comments)    Migraines and nightmares    Other Nausea And Vomiting and Other (See Comments)    Pt states that all -mycins cause N/V. (MACROLIDES)    Prednisone     Long term steroid problems with  lithium and blood sugar.    Tramadol Hcl     Other reaction(s): felt weird   Sulfa Antibiotics Other (See Comments)    Unknown reaction    Past Medical History:  Diagnosis Date   Alcohol abuse    Anxiety    takes Valium daily as needed and Ativan daily   Bilateral hearing loss    Bipolar 1 disorder ([place])    takes Lithium nightly and Synthroid daily   Chronic back pain    DDD; "all over" (09/14/2017)   Colitis, ischemic ([place]) 2012   Confusion    r/t meds   Depression    takes Prozac daily and Bupspirone    Diverticulosis    Dyslipidemia    takes Crestor daily   Fibromyalgia    Gastroparesis    Headache    "weekly" (09/14/2017)   Hepatic steatosis 06/18/12   severe   Hyperlipidemia    Hypertension    Hypothyroidism    IBS (irritable bowel syndrome)    Ischemic colitis ([place])    Joint pain    Joint swelling    Lupus erythematosus tumidus    tumid-skin   Migraine    "1-2/yr; maybe" (09/14/2017)   Numbness    in right foot   Osteoarthritis    "all over" (09/14/2017)   Osteoarthritis cervical spine    Osteoarthritis of hand    bilateral   Pneumonia    "walking pneumonia several times; long time since the last time" (09/14/2017)   Restless leg syndrome    takes  Requip nightly   Sciatica    Type II diabetes mellitus ([name])    Urinary frequency    Urinary leakage    Urinary urgency    Urinary, incontinence, stress female    Walking pneumonia    last time more than 19yrs ago    Past Surgical History:  Procedure Laterality Date   ABDOMINAL HYSTERECTOMY     "they left my ovaries"   APPENDECTOMY     BALLOON DILATION N/A 06/14/2020   Procedure: BALLOON DILATION;  Surgeon: Ronnette Juniper, MD;  Location: [facility];  Service: Gastroenterology;  Laterality: N/A;   BIOPSY  06/14/2020   Procedure: BIOPSY;  Surgeon: Ronnette Juniper, MD;  Location: [facility];  Service: Gastroenterology;;   CARDIAC CATHETERIZATION  09/14/2017   COLONOSCOPY     DENTAL SURGERY Left 10/2016   dental  implant   DILATION AND CURETTAGE OF UTERUS  X 4   ESOPHAGOGASTRODUODENOSCOPY     ESOPHAGOGASTRODUODENOSCOPY (EGD) WITH PROPOFOL N/A 06/14/2020   Procedure: ESOPHAGOGASTRODUODENOSCOPY (EGD) WITH PROPOFOL;  Surgeon: Ronnette Juniper, MD;  Location: [facility];  Service: Gastroenterology;  Laterality: N/A;   FLEXIBLE SIGMOIDOSCOPY N/A 06/21/2012   Procedure: FLEXIBLE SIGMOIDOSCOPY;  Surgeon: Jerene Bears, MD;  Location: [facility];  Service: Gastroenterology;  Laterality: N/A;   JOINT REPLACEMENT     LEFT HEART CATH AND CORONARY ANGIOGRAPHY N/A 09/14/2017   Procedure: LEFT HEART CATH AND CORONARY ANGIOGRAPHY;  Surgeon: Belva Crome, MD;  Location: [facility];  Service: Cardiovascular;  Laterality: N/A;   SHOULDER ARTHROSCOPY Right    "shaved spurs off rotator cuff"   TONSILLECTOMY     TOTAL HIP ARTHROPLASTY Right 06/09/2013   Procedure: TOTAL HIP ARTHROPLASTY;  Surgeon: Kerin Salen, MD;  Location: [facility];  Service: Orthopedics;  Laterality: Right;   TOTAL KNEE ARTHROPLASTY Left 02/07/2019   Procedure: TOTAL KNEE ARTHROPLASTY;  Surgeon: Paralee Cancel, MD;  Location: [facility];  Service: Orthopedics;  Laterality: Left;  70 mins   TUBAL LIGATION     TUMOR EXCISION Right 1968   angle of jaw; benign    Family History  Problem Relation Age of Onset   Drug abuse Mother    Alcohol abuse Mother    Alcohol abuse Father    Hypertension Father    CAD Brother    Hypertension Brother    Alcohol abuse Brother    Hypertension Brother    Hypertension Brother    Psoriasis Daughter    Arthritis Daughter        psoriatic arthritis    Alcohol abuse Grandchild     Social History:  reports that she has never smoked. She has never used smokeless tobacco. She reports that she does not currently use alcohol. She reports that she does not currently use drugs after having used the following drugs: Benzodiazepines. Frequency: 7.00 times per week.   Review of Systems    Lipid history: On Lipitor 40 mg  with usually good control of LDL  Triglycerides last below 200  Taking OTC fish oil    Lab Results  Component Value Date   CHOL 172 08/09/2020   HDL 56.90 08/09/2020   LDLCALC 79 08/09/2020   LDLDIRECT 137.0 12/04/2019   TRIG 180.0 (H) 08/09/2020   CHOLHDL 3 08/09/2020           No recent issues with high blood pressure, currently only on 25 mg losartan  BP Readings from Last 3 Encounters:  12/26/20 124/82  12/03/20 122/63  10/15/20 (!) 148/90  Most recent eye exam was In 03/2018, negative  Most recent foot exam: 6/21  THYROID: She previously had been on levothyroxine and Cytomel from her psychiatrist for several years with uncertain diagnosis She is taking levothyroxine 75 g, not clear why she is not getting Cytomel now She now complains of feeling excessively tired but also achy all over  Also on lithium  Her TSH is consistently normal Free T3 is slightly low although free T4 again normal  Labs as follows:  Lab Results  Component Value Date   TSH 1.27 12/24/2020   TSH 1.488 10/08/2020   TSH 1.37 08/28/2019   FREET4 0.90 12/24/2020   FREET4 0.90 08/28/2019   FREET4 1.01 04/26/2019   Lab Results  Component Value Date   T3FREE 2.2 (L) 12/24/2020   T3FREE 3.2 08/28/2019   T3FREE 2.7 04/26/2019   T3FREE 2.5 07/04/2018   T3FREE 2.3 12/21/2017    She has atypical depression on multiple drugs, has been on Seroquel for quite some time  THYROID nodule:  She was found to have a nodule in her thyroid isthmus in 8/20 Ultrasound done in 1/21 showed multinodular goiter with only 1 significant nodule   However nodule in the isthmus was biopsied on the recommendation of her PCP in 3/22 and that showed benign follicular nodule  Previously had mild hypercalcemia, last levels:  Lab Results  Component Value Date   PTH 35 10/25/2018   CALCIUM 10.3 12/24/2020   CAION 1.30 07/14/2016   PHOS 2.9 12/26/2010    No recent nausea or abdominal pain  She is on  Namenda and Aricept for presumably dementia  Physical Examination:  BP 124/82   Pulse 91   Ht 5\' 2"  (1.575 m)   Wt 180 lb (81.6 kg)   SpO2 92%   BMI 32.92 kg/m      Diabetic Foot Exam - Simple   Simple Foot Form Diabetic Foot exam was performed with the following findings: Yes   Visual Inspection No deformities, no ulcerations, no other skin breakdown bilaterally: Yes Sensation Testing See comments: Yes Pulse Check Posterior Tibialis and Dorsalis pulse intact bilaterally: Yes Comments Sensation markedly decreased in feet and toes including plantar surfaces     ASSESSMENT:  Diabetes type 2, on insulin  See history of present illness for detailed discussion of current diabetes management, blood sugar patterns, monitor download and problems identified  She is on 64 units Lantus insulin with Ozempic 1 mg weekly  Her A1c is 6.6 compared to 6.8, previously 6.1   She has been on 1 mg Ozempic without any nausea Previously intolerant to metformin She has not checked readings consistently but mostly before dinnertime which are averaging 145  She likely has some high readings after meals or with snacks including overnight but has difficulty being consistent with her diet  Not able to exercise With her memory issues unlikely that she will remember to take her Humalog before meals consistently  Probable secondary hypothyroidism: TSH normal  Since her T3 is relatively low she likely needs to go back on liothyronine especially with her symptoms of fatigue of unclear etiology  HYPERTENSION: Mild and well controlled  NEUROPATHY: She does have sensory loss  PLAN:   She will be prescribed Ozempic 2 mg doses if this is available, likely this will improve her postprandial readings Since blood sugars can be near normal at dinnertime will not increase her Lantus yet Given her blood sugar diary to keep a record of her monitoring so that  she can make sure she checks readings at  different times and keep up with this She will need to call if she has consistently high readings  Liothyronine 5 mcg daily in addition to Synthroid 75  Reminded her to check the bottom of her feet daily and also use her cane consistently to keep her balance   Patient Instructions  Check blood sugars on waking up 3-4 days a week  Also check blood sugars about 2 hours after meals and do this after different meals by rotation  Recommended blood sugar levels on waking up are 90-130 and about 2 hours after meal is 130-160  Please bring your blood sugar monitor to each visit, thank you         Elayne Snare 12/26/2020, 11:34 AM   Note: This office note was prepared with Dragon voice recognition system technology. Any transcriptional errors that result from this process are unintentional.

## 2020-12-26 NOTE — Patient Instructions (Signed)
Check blood sugars on waking up 3-4 days a week  Also check blood sugars about 2 hours after meals and do this after different meals by rotation  Recommended blood sugar levels on waking up are 90-130 and about 2 hours after meal is 130-160  Please bring your blood sugar monitor to each visit, thank you   

## 2021-01-01 ENCOUNTER — Ambulatory Visit: Payer: Medicare Other | Admitting: Psychiatry

## 2021-01-01 DIAGNOSIS — M549 Dorsalgia, unspecified: Secondary | ICD-10-CM | POA: Diagnosis not present

## 2021-01-01 DIAGNOSIS — M5430 Sciatica, unspecified side: Secondary | ICD-10-CM | POA: Diagnosis not present

## 2021-01-01 DIAGNOSIS — R5383 Other fatigue: Secondary | ICD-10-CM | POA: Diagnosis not present

## 2021-01-07 DIAGNOSIS — M545 Low back pain, unspecified: Secondary | ICD-10-CM | POA: Diagnosis not present

## 2021-01-10 ENCOUNTER — Encounter: Payer: Self-pay | Admitting: Psychiatry

## 2021-01-10 ENCOUNTER — Ambulatory Visit (INDEPENDENT_AMBULATORY_CARE_PROVIDER_SITE_OTHER): Payer: Medicare Other | Admitting: Psychiatry

## 2021-01-10 DIAGNOSIS — F5101 Primary insomnia: Secondary | ICD-10-CM | POA: Diagnosis not present

## 2021-01-10 DIAGNOSIS — F319 Bipolar disorder, unspecified: Secondary | ICD-10-CM | POA: Diagnosis not present

## 2021-01-10 DIAGNOSIS — G2581 Restless legs syndrome: Secondary | ICD-10-CM | POA: Diagnosis not present

## 2021-01-10 DIAGNOSIS — F411 Generalized anxiety disorder: Secondary | ICD-10-CM

## 2021-01-10 MED ORDER — MEMANTINE HCL 10 MG PO TABS: ORAL_TABLET | ORAL | 0 refills | Status: DC

## 2021-01-10 MED ORDER — QUETIAPINE FUMARATE 200 MG PO TABS: ORAL_TABLET | ORAL | 0 refills | Status: DC

## 2021-01-10 MED ORDER — DIVALPROEX SODIUM 250 MG PO DR TAB: 500.0000 mg | DELAYED_RELEASE_TABLET | Freq: Every day | ORAL | 0 refills | Status: DC

## 2021-01-10 MED ORDER — ROPINIROLE HCL 0.5 MG PO TABS: ORAL_TABLET | ORAL | 0 refills | Status: DC

## 2021-01-10 MED ORDER — ALPRAZOLAM 0.5 MG PO TABS: 0.5000 mg | ORAL_TABLET | Freq: Four times a day (QID) | ORAL | 2 refills | Status: DC | PRN

## 2021-01-10 MED ORDER — LITHIUM CARBONATE ER 450 MG PO TBCR: 450.0000 mg | EXTENDED_RELEASE_TABLET | Freq: Every day | ORAL | 0 refills | Status: DC
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
## 2021-01-13 ENCOUNTER — Other Ambulatory Visit: Payer: Self-pay | Admitting: Endocrinology

## 2021-01-13 DIAGNOSIS — M545 Low back pain, unspecified: Secondary | ICD-10-CM | POA: Diagnosis not present

## 2021-01-13 DIAGNOSIS — E119 Type 2 diabetes mellitus without complications: Secondary | ICD-10-CM

## 2021-01-16 DIAGNOSIS — N132 Hydronephrosis with renal and ureteral calculous obstruction: Secondary | ICD-10-CM | POA: Diagnosis not present

## 2021-01-16 DIAGNOSIS — K219 Gastro-esophageal reflux disease without esophagitis: Secondary | ICD-10-CM | POA: Diagnosis not present

## 2021-01-16 DIAGNOSIS — N261 Atrophy of kidney (terminal): Secondary | ICD-10-CM | POA: Diagnosis not present

## 2021-01-16 DIAGNOSIS — E039 Hypothyroidism, unspecified: Secondary | ICD-10-CM | POA: Diagnosis not present

## 2021-01-16 DIAGNOSIS — I10 Essential (primary) hypertension: Secondary | ICD-10-CM | POA: Diagnosis not present

## 2021-01-16 DIAGNOSIS — E1165 Type 2 diabetes mellitus with hyperglycemia: Secondary | ICD-10-CM | POA: Diagnosis not present

## 2021-01-16 DIAGNOSIS — G4700 Insomnia, unspecified: Secondary | ICD-10-CM | POA: Diagnosis not present

## 2021-01-16 DIAGNOSIS — F319 Bipolar disorder, unspecified: Secondary | ICD-10-CM | POA: Diagnosis not present

## 2021-01-16 DIAGNOSIS — E785 Hyperlipidemia, unspecified: Secondary | ICD-10-CM | POA: Diagnosis not present

## 2021-01-21 DIAGNOSIS — M545 Low back pain, unspecified: Secondary | ICD-10-CM | POA: Diagnosis not present

## 2021-01-22 DIAGNOSIS — Z96652 Presence of left artificial knee joint: Secondary | ICD-10-CM | POA: Diagnosis not present

## 2021-01-22 DIAGNOSIS — M25562 Pain in left knee: Secondary | ICD-10-CM | POA: Diagnosis not present

## 2021-01-27 ENCOUNTER — Encounter: Payer: Self-pay | Admitting: Neurology

## 2021-01-27 ENCOUNTER — Ambulatory Visit (INDEPENDENT_AMBULATORY_CARE_PROVIDER_SITE_OTHER): Payer: Medicare Other | Admitting: Neurology

## 2021-01-27 VITALS — BP 133/75 | HR 78 | Wt 176.0 lb

## 2021-01-27 DIAGNOSIS — E1165 Type 2 diabetes mellitus with hyperglycemia: Secondary | ICD-10-CM | POA: Insufficient documentation

## 2021-01-27 DIAGNOSIS — Z794 Long term (current) use of insulin: Secondary | ICD-10-CM | POA: Diagnosis not present

## 2021-01-27 DIAGNOSIS — E119 Type 2 diabetes mellitus without complications: Secondary | ICD-10-CM | POA: Insufficient documentation

## 2021-01-27 DIAGNOSIS — R413 Other amnesia: Secondary | ICD-10-CM | POA: Insufficient documentation

## 2021-01-27 DIAGNOSIS — G251 Drug-induced tremor: Secondary | ICD-10-CM | POA: Diagnosis not present

## 2021-01-27 DIAGNOSIS — F4310 Post-traumatic stress disorder, unspecified: Secondary | ICD-10-CM | POA: Diagnosis not present

## 2021-01-27 DIAGNOSIS — F319 Bipolar disorder, unspecified: Secondary | ICD-10-CM | POA: Diagnosis not present

## 2021-01-27 DIAGNOSIS — E1142 Type 2 diabetes mellitus with diabetic polyneuropathy: Secondary | ICD-10-CM | POA: Diagnosis not present

## 2021-01-27 DIAGNOSIS — G629 Polyneuropathy, unspecified: Secondary | ICD-10-CM | POA: Diagnosis not present

## 2021-01-27 NOTE — Patient Instructions (Signed)
Management of Memory Problems  There are some general things you can do to help manage your memory problems.  Your memory may not in fact recover, but by using techniques and strategies you will be able to manage your memory difficulties better.  1)  Establish a routine. Try to establish and then stick to a regular routine.  By doing this, you will get used to what to expect and you will reduce the need to rely on your memory.  Also, try to do things at the same time of day, such as taking your medication or checking your calendar first thing in the morning. Think about think that you can do as a part of a regular routine and make a list.  Then enter them into a daily planner to remind you.  This will help you establish a routine.  2)  Organize your environment. Organize your environment so that it is uncluttered.  Decrease visual stimulation.  Place everyday items such as keys or cell phone in the same place every day (ie.  Basket next to front door) Use post it notes with a brief message to yourself (ie. Turn off light, lock the door) Use labels to indicate where things go (ie. Which cupboards are for food, dishes, etc.) Keep a notepad and pen by the telephone to take messages  3)  Memory Aids A diary or journal/notebook/daily planner Making a list (shopping list, chore list, to do list that needs to be done) Using an alarm as a reminder (kitchen timer or cell phone alarm) Using cell phone to store information (Notes, Calendar, Reminders) Calendar/White board placed in a prominent position Post-it notes  In order for memory aids to be useful, you need to have good habits.  It's no good remembering to make a note in your journal if you don't remember to look in it.  Try setting aside a certain time of day to look in journal.  4)  Improving mood and managing fatigue. There may be other factors that contribute to memory difficulties.  Factors, such as anxiety, depression and tiredness can  affect memory. Regular gentle exercise can help improve your mood and give you more energy. Simple relaxation techniques may help relieve symptoms of anxiety Try to get back to completing activities or hobbies you enjoyed doing in the past. Learn to pace yourself through activities to decrease fatigue. Find out about some local support groups where you can share experiences with others. Try and achieve 7-8 hours of sleep at night.Mindfulness-Based Stress Reduction Mindfulness-based stress reduction (MBSR) is a program that helps people learn to practice mindfulness. Mindfulness is the practice of consciously paying attention to the present moment. MBSR focuses on developing self-awareness, which lets you respond to life stress without judgment or negative feelings. It can be learned and practiced through techniques such as education, breathing exercises, meditation, and yoga. MBSR includes several mindfulness techniques in one program. MBSR works best when you understand the treatment, are willing to try new things, and can commit to spending time practicing what you learn. MBSR training may include learning about: How your feelings, thoughts, and reactions affect your body. New ways to respond to things that cause negative thoughts to start (triggers). How to notice your thoughts and let go of them. Practicing awareness of everyday things that you normally do without thinking. The techniques and goals of different types of meditation. What are the benefits of MBSR? MBSR can have many benefits, which include helping you to: Develop self-awareness.  This means knowing and understanding yourself. Learn skills and attitudes that help you to take part in your own health care. Learn new ways to care for yourself. Be more accepting about how things are, and let things go. Be less judgmental and approach things with an open mind. Be patient with yourself and trust yourself more. MBSR has also been  shown to: Reduce negative emotions, such as sadness, overwhelm, and worry. Improve memory and focus. Change how you sense and react to pain. Boost your body's ability to fight infections. Help you connect better with other people. Improve your sense of well-being. How to practice mindfulness To do a basic awareness exercise: Find a comfortable place to sit. Pay attention to the present moment. Notice your thoughts, feelings, and surroundings just as they are. Avoid judging yourself, your feelings, or your surroundings. Make note of any judgment that comes up and let it go. Your mind may wander, and that is okay. Make note of when your thoughts drift, and return your attention to the present moment. To do basic mindfulness meditation: Find a comfortable place to sit. This may include a stable chair or a firm floor cushion. Sit upright with your back straight. Let your arms fall next to your sides, with your hands resting on your legs. If you are sitting in a chair, rest your feet flat on the floor. If you are sitting on a cushion, cross your legs in front of you. Keep your head in a neutral position with your chin dropped slightly. Relax your jaw and rest the tip of your tongue on the roof of your mouth. Drop your gaze to the floor or close your eyes. Breathe normally and pay attention to your breath. Feel the air moving in and out of your nose. Feel your belly expanding and relaxing with each breath. Your mind may wander, and that is okay. Make note of when your thoughts drift, and return your attention to your breath. Avoid judging yourself, your feelings, or your surroundings. Make note of any judgment or feelings that come up, let them go, and bring your attention back to your breath. When you are ready, lift your gaze or open your eyes. Pay attention to how your body feels after the meditation. Follow these instructions at home:  Find a local in-person or online MBSR program. Set aside  some time regularly for mindfulness practice. Practice every day if you can. Even 10 minutes of practice is helpful. Find a mindfulness practice that works best for you. This may include one or more of the following: Meditation. This involves focusing your mind on a certain thought or activity. Breathing awareness exercises. These help you to stay present by focusing on your breath. Body scan. For this practice, you lie down and pay attention to each part of your body from head to toe. You can identify tension and soreness and consciously relax parts of your body. Yoga. Yoga involves stretching and breathing, and it can improve your ability to move and be flexible. It can also help you to test your body's limits, which can help you release stress. Mindful eating. This way of eating involves focusing on the taste, texture, color, and smell of each bite of food. This slows down eating and helps you feel full sooner. For this reason, it can be an important part of a weight loss plan. Find a podcast or recording that provides guidance for breathing awareness, body scan, or meditation exercises. You can listen to these any  time when you have a free moment to rest without distractions. Follow your treatment plan as told by your health care provider. This may include taking regular medicines and making changes to your diet or lifestyle as recommended. Where to find more information You can find more information about MBSR from: Your health care provider. Community-based meditation centers or programs. Programs offered near you. Summary Mindfulness-based stress reduction (MBSR) is a program that teaches you how to consciously pay attention to the present moment. It is used to help you deal better with daily stress, feelings, and pain. MBSR focuses on developing self-awareness, which allows you to respond to life stress without judgment or negative feelings. MBSR programs may involve learning different  mindfulness practices, such as breathing exercises, meditation, yoga, body scan, or mindful eating. Find a mindfulness practice that works best for you, and set aside time for it on a regular basis. This information is not intended to replace advice given to you by your health care provider. Make sure you discuss any questions you have with your health care provider. Document Revised: 10/03/2020 Document Reviewed: 10/03/2020 Elsevier Patient Education  [place].
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
## 2021-01-29 ENCOUNTER — Ambulatory Visit (INDEPENDENT_AMBULATORY_CARE_PROVIDER_SITE_OTHER): Payer: Medicare Other | Admitting: Psychiatry

## 2021-01-29 ENCOUNTER — Encounter: Payer: Self-pay | Admitting: Psychiatry

## 2021-01-29 DIAGNOSIS — F319 Bipolar disorder, unspecified: Secondary | ICD-10-CM | POA: Diagnosis not present

## 2021-01-29 DIAGNOSIS — F5101 Primary insomnia: Secondary | ICD-10-CM | POA: Diagnosis not present

## 2021-01-29 DIAGNOSIS — F411 Generalized anxiety disorder: Secondary | ICD-10-CM | POA: Diagnosis not present

## 2021-01-29 MED ORDER — OLANZAPINE 2.5 MG PO TABS: 2.5000 mg | ORAL_TABLET | Freq: Every day | ORAL | 1 refills | Status: DC
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
## 2021-01-29 NOTE — Progress Notes (Signed)
Too many parts of this lab panel are still pending, will be completed next week.   Cc London Pepper, MD

## 2021-01-29 NOTE — Progress Notes (Signed)
CMET- non fast glucose is irrelevant for this work up, The slight increase in calcium can be related to supplemental calcium intake ?  Too many parts of this lab panel are still pending, will be completed next week.   Cc London Pepper, MD

## 2021-01-30 LAB — COMPREHENSIVE METABOLIC PANEL
ALT: 15 IU/L (ref 0–32)
AST: 13 IU/L (ref 0–40)
Albumin/Globulin Ratio: 2.2 (ref 1.2–2.2)
Albumin: 4.4 g/dL (ref 3.7–4.7)
Alkaline Phosphatase: 99 IU/L (ref 44–121)
BUN/Creatinine Ratio: 9 — ABNORMAL LOW (ref 12–28)
BUN: 8 mg/dL (ref 8–27)
Bilirubin Total: 0.4 mg/dL (ref 0.0–1.2)
CO2: 23 mmol/L (ref 20–29)
Calcium: 10.5 mg/dL — ABNORMAL HIGH (ref 8.7–10.3)
Chloride: 105 mmol/L (ref 96–106)
Creatinine, Ser: 0.88 mg/dL (ref 0.57–1.00)
Globulin, Total: 2 g/dL (ref 1.5–4.5)
Glucose: 129 mg/dL — ABNORMAL HIGH (ref 70–99)
Potassium: 4.8 mmol/L (ref 3.5–5.2)
Sodium: 140 mmol/L (ref 134–144)
Total Protein: 6.4 g/dL (ref 6.0–8.5)
eGFR: 69 mL/min/{1.73_m2} (ref >59)

## 2021-01-30 LAB — CBC WITH DIFFERENTIAL/PLATELET
Basophils Absolute: 0 10*3/uL (ref 0.0–0.2)
Basos: 0 %
EOS (ABSOLUTE): 0.1 10*3/uL (ref 0.0–0.4)
Eos: 1 %
Hematocrit: 41.5 % (ref 34.0–46.6)
Hemoglobin: 14 g/dL (ref 11.1–15.9)
Immature Grans (Abs): 0.1 10*3/uL (ref 0.0–0.1)
Immature Granulocytes: 1 %
Lymphocytes Absolute: 1.9 10*3/uL (ref 0.7–3.1)
Lymphs: 19 %
MCH: 29.3 pg (ref 26.6–33.0)
MCHC: 33.7 g/dL (ref 31.5–35.7)
MCV: 87 fL (ref 79–97)
Monocytes Absolute: 0.5 10*3/uL (ref 0.1–0.9)
Monocytes: 5 %
Neutrophils Absolute: 7.5 10*3/uL — ABNORMAL HIGH (ref 1.4–7.0)
Neutrophils: 74 %
Platelets: 255 10*3/uL (ref 150–450)
RBC: 4.78 x10E6/uL (ref 3.77–5.28)
RDW: 13.6 % (ref 11.7–15.4)
WBC: 10.1 10*3/uL (ref 3.4–10.8)

## 2021-01-30 LAB — MULTIPLE MYELOMA PANEL, SERUM
Albumin SerPl Elph-Mcnc: 3.4 g/dL (ref 2.9–4.4)
Albumin/Glob SerPl: 1.2 (ref 0.7–1.7)
Alpha 1: 0.3 g/dL (ref 0.0–0.4)
Alpha2 Glob SerPl Elph-Mcnc: 0.9 g/dL (ref 0.4–1.0)
B-Globulin SerPl Elph-Mcnc: 1.2 g/dL (ref 0.7–1.3)
Gamma Glob SerPl Elph-Mcnc: 0.6 g/dL (ref 0.4–1.8)
Globulin, Total: 3 g/dL (ref 2.2–3.9)
IgA/Immunoglobulin A, Serum: 58 mg/dL — ABNORMAL LOW (ref 64–422)
IgG (Immunoglobin G), Serum: 671 mg/dL (ref 586–1602)
IgM (Immunoglobulin M), Srm: 29 mg/dL (ref 26–217)

## 2021-01-30 LAB — ANA W/REFLEX: ANA Titer 1: NEGATIVE

## 2021-01-30 LAB — METHYLMALONIC ACID, SERUM: Methylmalonic Acid: 100 nmol/L (ref 0–378)

## 2021-01-30 LAB — SJOGREN'S SYNDROME ANTIBODS(SSA + SSB)
ENA SSA (RO) Ab: <0.2 AI (ref 0.0–0.9)
ENA SSB (LA) Ab: <0.2 AI (ref 0.0–0.9)

## 2021-01-30 LAB — HEPATITIS B SURFACE ANTIGEN: Hepatitis B Surface Ag: NEGATIVE

## 2021-01-30 LAB — SEDIMENTATION RATE: Sed Rate: 21 mm/hr (ref 0–40)

## 2021-01-30 LAB — HEPATITIS C ANTIBODY: Hep C Virus Ab: <0.1 s/co ratio (ref 0.0–0.9)

## 2021-01-30 LAB — C-REACTIVE PROTEIN: CRP: 2 mg/L (ref 0–10)

## 2021-01-30 LAB — HOMOCYSTEINE: Homocysteine: 10.9 umol/L (ref 0.0–19.2)

## 2021-02-03 ENCOUNTER — Telehealth: Payer: Self-pay | Admitting: *Deleted

## 2021-02-03 DIAGNOSIS — Z20822 Contact with and (suspected) exposure to covid-19: Secondary | ICD-10-CM | POA: Diagnosis not present

## 2021-02-03 NOTE — Telephone Encounter (Signed)
-----   Message from Larey Seat, MD sent at 02/03/2021  9:14 AM EST ----- Elevated calcium level, , well hydrated patient  ,non fasting glucose elevated.  Normal Methylmalonic acid means normal Vit B 12 level.  IgA was slightly reduced below normal, this is not a finding correlated to a specific disease or disorder.  ANA negative. ENA negative. Hep C negative.

## 2021-02-03 NOTE — Progress Notes (Signed)
Elevated calcium level, , well hydrated patient  ,non fasting glucose elevated.  Normal Methylmalonic acid means normal Vit B 12 level.  IgA was slightly reduced below normal, this is not a finding correlated to a specific disease or disorder.  ANA negative. ENA negative. Hep C negative.

## 2021-02-03 NOTE — Telephone Encounter (Signed)
LVM for pt to call about results. °

## 2021-02-04 NOTE — Telephone Encounter (Signed)
Took call from phone staff and spoke w/ pt about results. Pt verbalized understanding.

## 2021-02-07 ENCOUNTER — Other Ambulatory Visit: Payer: Self-pay

## 2021-02-07 DIAGNOSIS — Z794 Long term (current) use of insulin: Secondary | ICD-10-CM

## 2021-02-07 DIAGNOSIS — E1165 Type 2 diabetes mellitus with hyperglycemia: Secondary | ICD-10-CM

## 2021-02-07 MED ORDER — OZEMPIC (2 MG/DOSE) 8 MG/3ML ~~LOC~~ SOPN: PEN_INJECTOR | SUBCUTANEOUS | 1 refills | Status: DC

## 2021-02-11 ENCOUNTER — Encounter (HOSPITAL_BASED_OUTPATIENT_CLINIC_OR_DEPARTMENT_OTHER): Payer: Self-pay | Admitting: Emergency Medicine

## 2021-02-11 ENCOUNTER — Other Ambulatory Visit: Payer: Self-pay

## 2021-02-11 ENCOUNTER — Emergency Department (HOSPITAL_BASED_OUTPATIENT_CLINIC_OR_DEPARTMENT_OTHER)
Admission: EM | Admit: 2021-02-11 | Discharge: 2021-02-11 | Disposition: A | Discharge status: Home / Self Care | Payer: Medicare Other | Attending: Emergency Medicine | Admitting: Emergency Medicine

## 2021-02-11 ENCOUNTER — Emergency Department (HOSPITAL_BASED_OUTPATIENT_CLINIC_OR_DEPARTMENT_OTHER): Payer: Medicare Other

## 2021-02-11 DIAGNOSIS — E039 Hypothyroidism, unspecified: Secondary | ICD-10-CM | POA: Insufficient documentation

## 2021-02-11 DIAGNOSIS — Z20822 Contact with and (suspected) exposure to covid-19: Secondary | ICD-10-CM | POA: Diagnosis not present

## 2021-02-11 DIAGNOSIS — Z96652 Presence of left artificial knee joint: Secondary | ICD-10-CM | POA: Diagnosis not present

## 2021-02-11 DIAGNOSIS — R109 Unspecified abdominal pain: Secondary | ICD-10-CM

## 2021-02-11 DIAGNOSIS — Z96641 Presence of right artificial hip joint: Secondary | ICD-10-CM | POA: Insufficient documentation

## 2021-02-11 DIAGNOSIS — Z794 Long term (current) use of insulin: Secondary | ICD-10-CM | POA: Diagnosis not present

## 2021-02-11 DIAGNOSIS — K5732 Diverticulitis of large intestine without perforation or abscess without bleeding: Secondary | ICD-10-CM | POA: Diagnosis not present

## 2021-02-11 DIAGNOSIS — I10 Essential (primary) hypertension: Secondary | ICD-10-CM | POA: Diagnosis not present

## 2021-02-11 DIAGNOSIS — K5792 Diverticulitis of intestine, part unspecified, without perforation or abscess without bleeding: Secondary | ICD-10-CM | POA: Diagnosis not present

## 2021-02-11 DIAGNOSIS — R1032 Left lower quadrant pain: Secondary | ICD-10-CM | POA: Diagnosis not present

## 2021-02-11 DIAGNOSIS — Z79899 Other long term (current) drug therapy: Secondary | ICD-10-CM | POA: Insufficient documentation

## 2021-02-11 DIAGNOSIS — E1142 Type 2 diabetes mellitus with diabetic polyneuropathy: Secondary | ICD-10-CM | POA: Diagnosis not present

## 2021-02-11 LAB — URINALYSIS, ROUTINE W REFLEX MICROSCOPIC
Bilirubin Urine: NEGATIVE
Glucose, UA: NEGATIVE mg/dL
Hgb urine dipstick: NEGATIVE
Ketones, ur: NEGATIVE mg/dL
Nitrite: NEGATIVE
Protein, ur: NEGATIVE mg/dL
Specific Gravity, Urine: 1.013 (ref 1.005–1.030)
pH: 6.5 (ref 5.0–8.0)

## 2021-02-11 LAB — RESP PANEL BY RT-PCR (FLU A&B, COVID) ARPGX2
Influenza A by PCR: NEGATIVE
Influenza B by PCR: NEGATIVE
SARS Coronavirus 2 by RT PCR: NEGATIVE

## 2021-02-11 LAB — CBC WITH DIFFERENTIAL/PLATELET
Abs Immature Granulocytes: 0.1 10*3/uL — ABNORMAL HIGH (ref 0.00–0.07)
Basophils Absolute: 0 10*3/uL (ref 0.0–0.1)
Basophils Relative: 0 %
Eosinophils Absolute: 0.2 10*3/uL (ref 0.0–0.5)
Eosinophils Relative: 2 %
HCT: 44.9 % (ref 36.0–46.0)
Hemoglobin: 14.1 g/dL (ref 12.0–15.0)
Immature Granulocytes: 1 %
Lymphocytes Relative: 17 %
Lymphs Abs: 1.8 10*3/uL (ref 0.7–4.0)
MCH: 29 pg (ref 26.0–34.0)
MCHC: 31.4 g/dL (ref 30.0–36.0)
MCV: 92.4 fL (ref 80.0–100.0)
Monocytes Absolute: 0.6 10*3/uL (ref 0.1–1.0)
Monocytes Relative: 6 %
Neutro Abs: 8.1 10*3/uL — ABNORMAL HIGH (ref 1.7–7.7)
Neutrophils Relative %: 74 %
Platelets: 232 10*3/uL (ref 150–400)
RBC: 4.86 MIL/uL (ref 3.87–5.11)
RDW: 13.9 % (ref 11.5–15.5)
WBC: 10.8 10*3/uL — ABNORMAL HIGH (ref 4.0–10.5)
nRBC: 0 % (ref 0.0–0.2)

## 2021-02-11 LAB — COMPREHENSIVE METABOLIC PANEL
ALT: 14 U/L (ref 0–44)
AST: 22 U/L (ref 15–41)
Albumin: 4.2 g/dL (ref 3.5–5.0)
Alkaline Phosphatase: 90 U/L (ref 38–126)
Anion gap: 9 (ref 5–15)
BUN: 10 mg/dL (ref 8–23)
CO2: 26 mmol/L (ref 22–32)
Calcium: 10.5 mg/dL — ABNORMAL HIGH (ref 8.9–10.3)
Chloride: 105 mmol/L (ref 98–111)
Creatinine, Ser: 0.84 mg/dL (ref 0.44–1.00)
GFR, Estimated: >60 mL/min (ref >60)
Glucose, Bld: 198 mg/dL — ABNORMAL HIGH (ref 70–99)
Potassium: 5.3 mmol/L — ABNORMAL HIGH (ref 3.5–5.1)
Sodium: 140 mmol/L (ref 135–145)
Total Bilirubin: 0.5 mg/dL (ref 0.3–1.2)
Total Protein: 6.8 g/dL (ref 6.5–8.1)

## 2021-02-11 LAB — LIPASE, BLOOD: Lipase: 24 U/L (ref 11–51)

## 2021-02-11 MED ORDER — FENTANYL CITRATE PF 50 MCG/ML IJ SOSY
50.0000 ug | PREFILLED_SYRINGE | Freq: Once | INTRAMUSCULAR | Status: AC
  Administered 2021-02-11: 50 ug via INTRAVENOUS
  Filled 2021-02-11: qty 1

## 2021-02-11 MED ORDER — IOHEXOL 300 MG/ML  SOLN
85.0000 mL | Freq: Once | INTRAMUSCULAR | Status: AC | PRN
  Administered 2021-02-11: 85 mL via INTRAVENOUS

## 2021-02-11 MED ORDER — OXYCODONE HCL 5 MG PO TABS: 5.0000 mg | ORAL_TABLET | Freq: Four times a day (QID) | ORAL | 0 refills | Status: DC | PRN

## 2021-02-11 MED ORDER — SODIUM ZIRCONIUM CYCLOSILICATE 10 G PO PACK
10.0000 g | PACK | Freq: Once | ORAL | Status: AC
  Administered 2021-02-11: 10 g via ORAL
  Filled 2021-02-11: qty 1

## 2021-02-11 MED ORDER — LOKELMA 5 G PO PACK: 10.0000 g | PACK | Freq: Once | ORAL | 0 refills | Status: DC

## 2021-02-11 MED ORDER — LOKELMA 5 G PO PACK
5.0000 g | PACK | Freq: Once | ORAL | 0 refills | Status: AC
Start: 2021-02-12 — End: 2021-02-12

## 2021-02-11 MED ORDER — OXYCODONE HCL 5 MG PO TABS
5.0000 mg | ORAL_TABLET | Freq: Once | ORAL | Status: AC
  Administered 2021-02-11: 5 mg via ORAL
  Filled 2021-02-11: qty 1

## 2021-02-11 MED ORDER — ACETAMINOPHEN 325 MG PO TABS: 650.0000 mg | ORAL_TABLET | Freq: Four times a day (QID) | ORAL | 0 refills | Status: DC | PRN

## 2021-02-11 MED ORDER — ONDANSETRON HCL 4 MG/2ML IJ SOLN
4.0000 mg | Freq: Once | INTRAMUSCULAR | Status: AC
  Administered 2021-02-11: 4 mg via INTRAVENOUS
  Filled 2021-02-11: qty 2

## 2021-02-11 MED ORDER — SODIUM CHLORIDE 0.9 % IV BOLUS
1000.0000 mL | Freq: Once | INTRAVENOUS | Status: AC
  Administered 2021-02-11: 1000 mL via INTRAVENOUS

## 2021-02-11 NOTE — ED Triage Notes (Signed)
Reports lower abdominal pain for the last 5 days.  Hx of the same.  Says it feels like when she has diverticulitis.  Also endorses being constipated.

## 2021-02-11 NOTE — Discharge Instructions (Addendum)
Please follow up with your pcp in 2 days for repeat potassium level. Also follow up regarding your ongoing symptoms.

## 2021-02-11 NOTE — ED Provider Notes (Signed)
[facility] EMERGENC[facility] DEPT Provider Note   CSN: [id number] Arrival date & time: 02/11/21  0920     History Chief Complaint  Patient presents with   Abdominal Pain    [name] [name] is a 75 y.o. female.  This is a 75 y.o. female with significant medical history as below, including  diverticulitis, IBS, depression, bipolar 1 disorder  who presents to the ED with complaint of abdominal pain.  Localized to the lower quadrants, left greater than right.  Pain described as aching, cramping.  Duration approximately 5 days.  Has been gradually worsening.  Feels similar to prior episodes of diverticulitis.  She has reports intermittent constipation.  She has IBS, frequently takes Imodium for her diarrhea that is chronic, she last took Imodium yesterday, last bowel movement was last night.  No black stool or blood per rectum.  No nausea or vomiting, no fevers, chills, chest pain, dyspnea, recent change to oral intake.  Taking all home medications as prescribed.  She has been taking tizanidine intermittently with mild improvement to her symptoms.  No recent laxative use.  No changes to urination.  Prior abdominal surgeries include hysterectomy.  She follows with gastroenterology as outpatient, colonoscopy in the last 5 years per patient.  The history is provided by the patient. No language interpreter was used.  Abdominal Pain Pain location:  LLQ and RLQ Pain quality: aching   Pain radiates to:  Does not radiate Pain severity:  Mild Onset quality:  Gradual Duration:  5 days Timing:  Intermittent Progression:  Worsening Chronicity:  Recurrent Context: not alcohol use, not laxative use and not medication withdrawal   Worsened by:  Palpation Associated symptoms: constipation   Associated symptoms: no chest pain, no chills, no cough, no fever, no hematemesis, no hematochezia, no hematuria, no melena, no nausea, no shortness of breath and no vomiting       Past Medical History:   Diagnosis Date   Alcohol abuse    Anxiety    takes Valium daily as needed and Ativan daily   Bilateral hearing loss    Bipolar 1 disorder ([place])    takes Lithium nightly and Synthroid daily   Chronic back pain    DDD; "all over" (09/14/2017)   Colitis, ischemic ([place]) 2012   Confusion    r/t meds   Depression    takes Prozac daily and Bupspirone    Diverticulosis    Dyslipidemia    takes Crestor daily   Fibromyalgia    Gastroparesis    Headache    "weekly" (09/14/2017)   Hepatic steatosis 06/18/12   severe   Hyperlipidemia    Hypertension    Hypothyroidism    IBS (irritable bowel syndrome)    Ischemic colitis ([place])    Joint pain    Joint swelling    Lupus erythematosus tumidus    tumid-skin   Migraine    "1-2/yr; maybe" (09/14/2017)   Numbness    in right foot   Osteoarthritis    "all over" (09/14/2017)   Osteoarthritis cervical spine    Osteoarthritis of hand    bilateral   Pneumonia    "walking pneumonia several times; long time since the last time" (09/14/2017)   Restless leg syndrome    takes Requip nightly   Sciatica    Type II diabetes mellitus ([name])    Urinary frequency    Urinary leakage    Urinary urgency    Urinary, incontinence, stress female    Walking pneumonia  last time more than 48yrs ago    Patient Active Problem List   Diagnosis Date Noted   Type 2 diabetes mellitus with diabetic polyneuropathy, with long-term current use of insulin ([place]) 01/27/2021   Memory disorder 01/27/2021   Bipolar 1 disorder, depressed ([place]) 01/27/2021   PTSD (post-traumatic stress disorder) 01/27/2021   Intractable abdominal pain 07/10/2020   Pancreatic abnormality 07/10/2020   Diabetes mellitus type 2 in obese ([place]) 07/10/2020   Essential hypertension 07/10/2020   Bipolar 1 disorder, mixed, moderate ([place])    Suicidal ideations    Overweight (BMI 25.0-29.9) 02/08/2019   S/P left TKA 02/07/2019   Chest pain    Chest pain with normal coronary angiography     Diverticulitis 11/19/2016   Hypothyroidism 07/14/2016   Diarrhea    Generalized abdominal pain    Major depressive disorder, recurrent severe without psychotic features ([place]) 12/31/2015   Nausea without vomiting    Colitis 10/28/2015   Unintentional poisoning by psychotropic drug 07/05/2015   Restless leg syndrome 07/05/2015   Peripheral sensory neuropathy due to type 2 diabetes mellitus ([place]) 09/21/2014   Arthritis of left hip 06/09/2013   Arthritis of right hip 06/08/2013   Hip pain, right 05/02/2013   IBS (irritable bowel syndrome) 01/02/2013   Unspecified vitamin D deficiency 07/05/2012   Pure hyperglyceridemia 07/04/2012   Diabetes mellitus with diabetic neuropathy, without long-term current use of insulin ([place]) 06/18/2012   Hypertension 06/18/2012   Bipolar 1 disorder ([place]) 06/18/2012   Low back pain 02/25/2011   Acute ischemic colitis ([place]) 01/06/2011   Sciatica 12/19/2010   Fibromyalgia 10/28/2010   Primary osteoarthritis of right hip 10/24/2010   Hyperlipidemia with target low density lipoprotein (LDL) cholesterol less than 100 mg/dL 05/05/2010   Anxiety and depression 05/05/2010   ABUSE, ALCOHOL, IN REMISSION 05/05/2010   OTITIS MEDIA, CHRONIC 05/05/2010   Hearing loss 05/05/2010   ALLERGIC RHINITIS, SEASONAL, MILD 05/05/2010   URINARY INCONTINENCE, STRESS, FEMALE 05/05/2010   OSTEOARTHRITIS, HANDS, BILATERAL 05/05/2010   OSTEOARTHRITIS, CERVICAL SPINE 05/05/2010    Past Surgical History:  Procedure Laterality Date   ABDOMINAL HYSTERECTOMY     "they left my ovaries"   APPENDECTOMY     BALLOON DILATION N/A 06/14/2020   Procedure: BALLOON DILATION;  Surgeon: Ronnette Juniper, MD;  Location: [facility];  Service: Gastroenterology;  Laterality: N/A;   BIOPSY  06/14/2020   Procedure: BIOPSY;  Surgeon: Ronnette Juniper, MD;  Location: [facility];  Service: Gastroenterology;;   CARDIAC CATHETERIZATION  09/14/2017   COLONOSCOPY     DENTAL SURGERY Left 10/2016   dental implant    DILATION AND CURETTAGE OF UTERUS  X 4   ESOPHAGOGASTRODUODENOSCOPY     ESOPHAGOGASTRODUODENOSCOPY (EGD) WITH PROPOFOL N/A 06/14/2020   Procedure: ESOPHAGOGASTRODUODENOSCOPY (EGD) WITH PROPOFOL;  Surgeon: Ronnette Juniper, MD;  Location: [facility];  Service: Gastroenterology;  Laterality: N/A;   FLEXIBLE SIGMOIDOSCOPY N/A 06/21/2012   Procedure: FLEXIBLE SIGMOIDOSCOPY;  Surgeon: Jerene Bears, MD;  Location: [facility];  Service: Gastroenterology;  Laterality: N/A;   JOINT REPLACEMENT     LEFT HEART CATH AND CORONARY ANGIOGRAPHY N/A 09/14/2017   Procedure: LEFT HEART CATH AND CORONARY ANGIOGRAPHY;  Surgeon: Belva Crome, MD;  Location: [facility];  Service: Cardiovascular;  Laterality: N/A;   SHOULDER ARTHROSCOPY Right    "shaved spurs off rotator cuff"   TONSILLECTOMY     TOTAL HIP ARTHROPLASTY Right 06/09/2013   Procedure: TOTAL HIP ARTHROPLASTY;  Surgeon: Kerin Salen, MD;  Location: [facility];  Service: Orthopedics;  Laterality: Right;   TOTAL KNEE ARTHROPLASTY Left 02/07/2019   Procedure: TOTAL KNEE ARTHROPLASTY;  Surgeon: Paralee Cancel, MD;  Location: [facility];  Service: Orthopedics;  Laterality: Left;  70 mins   TUBAL LIGATION     TUMOR EXCISION Right 1968   angle of jaw; benign     OB History     Gravida  2   Para  2   Term      Preterm      AB      Living         SAB      IAB      Ectopic      Multiple      Live Births              Family History  Problem Relation Age of Onset   Drug abuse Mother    Alcohol abuse Mother    Alcohol abuse Father    Hypertension Father    CAD Brother    Hypertension Brother    Alcohol abuse Brother    Hypertension Brother    Hypertension Brother    Psoriasis Daughter    Arthritis Daughter        psoriatic arthritis    Alcohol abuse Grandchild     Social History   Tobacco Use   Smoking status: Never   Smokeless tobacco: Never  Vaping Use   Vaping Use: Never used  Substance Use Topics   Alcohol use: Not  Currently    Comment: 09/14/2017 "nothing since 04/15/2008"   Drug use: Not Currently    Frequency: 7.0 times per week    Types: Benzodiazepines    Home Medications Prior to Admission medications   Medication Sig Start Date End Date Taking? Authorizing Provider  acetaminophen (TYLENOL) 325 MG tablet Take 2 tablets (650 mg total) by mouth every 6 (six) hours as needed. 02/11/21  Yes Wynona Dove A, DO  oxyCODONE (ROXICODONE) 5 MG immediate release tablet Take 1 tablet (5 mg total) by mouth every 6 (six) hours as needed for severe pain. 02/11/21  Yes Jeanell Sparrow, DO  ALPRAZolam Duanne Moron) 0.5 MG tablet Take 1 tablet (0.5 mg total) by mouth 4 (four) times daily as needed for anxiety. 02/19/21   [name], PMHNP  atorvastatin (LIPITOR) 40 MG tablet Take 40 mg by mouth every evening.    [provider]  b complex vitamins capsule Take 1 capsule by mouth daily.    [provider]  BD PEN NEEDLE NANO 2ND GEN 32G X 4 MM MISC USE TO INJECT 5 TIMES DAILY AS DIRECTED 09/20/20   Elayne Snare, MD  cholecalciferol (VITAMIN D3) 25 MCG (1000 UNIT) tablet Take 3,000 Units by mouth daily.    [provider]  divalproex (DEPAKOTE) 250 MG DR tablet Take 2 tablets (500 mg total) by mouth at bedtime. 01/10/21   [name], PMHNP  donepezil (ARICEPT) 10 MG tablet Take 1 tablet (10 mg total) by mouth at bedtime. 12/03/20   [name], PMHNP  gabapentin (NEURONTIN) 600 MG tablet Take 600 mg by mouth 3 (three) times daily. 05/12/19   [provider]  glucose blood (CONTOUR NEXT TEST) test strip USE TO TEST BLOOD SUGAR TWICE DAILY 06/20/20   Elayne Snare, MD  LANTUS SOLOSTAR 100 UNIT/ML Solostar Pen ADMINISTER 64 UNITS UNDER THE SKIN AT BEDTIME 01/13/21   Elayne Snare, MD  levothyroxine (SYNTHROID) 75 MCG tablet TAKE 1 TABLET BY MOUTH EVERY DAY BEFORE  BREAKFAST 12/26/20   Elayne Snare, MD  liothyronine (CYTOMEL) 5 MCG tablet Take 1 tablet (5 mcg total) by mouth daily. Take with  levothyroxine before breakfast 12/26/20   Elayne Snare, MD  lithium carbonate (ESKALITH) 450 MG CR tablet Take 1 tablet (450 mg total) by mouth at bedtime. 01/10/21   [name], PMHNP  loperamide (IMODIUM A-D) 2 MG tablet Take 2 mg by mouth 2 (two) times daily as needed for diarrhea or loose stools.    [provider]  losartan (COZAAR) 25 MG tablet Take 25 mg by mouth daily. 08/31/19   [provider]  Melatonin 10 MG TBCR Take 10 mg by mouth at bedtime.    [provider]  memantine (NAMENDA) 10 MG tablet TAKE 1 TABLET(10 MG) BY MOUTH EVERY EVENING 01/10/21   [name], PMHNP  Microlet Lancets MISC TEST DAILY AS DIRECTED 06/20/20   Elayne Snare, MD  nitroGLYCERIN (NITROSTAT) 0.4 MG SL tablet Place 1 tablet (0.4 mg total) under the tongue every 5 (five) minutes as needed for chest pain. 09/14/17   Lyda Jester M, PA-C  OLANZapine (ZYPREXA) 2.5 MG tablet Take 1 tablet (2.5 mg total) by mouth at bedtime. 01/29/21   [name], PMHNP  ondansetron (ZOFRAN) 4 MG tablet Take 4 mg by mouth every 8 (eight) hours as needed for nausea or vomiting. 11/26/19   [provider]  Probiotic Product (PROBIOTIC ADVANCED PO) Take by mouth 2 (two) times daily.    [provider]  QUEtiapine (SEROQUEL) 200 MG tablet TAKE 1 TAB PO q 5 pm and 1 tab po QHS 01/10/21   [name], PMHNP  rOPINIRole (REQUIP) 0.5 MG tablet Takes 4 tabs at 5 pm and 3 tabs at bedtime and 1-2 tabs prn 01/10/21   [name], PMHNP  Semaglutide, 2 MG/DOSE, (OZEMPIC, 2 MG/DOSE,) 8 MG/3ML SOPN Inject 2 mg weekly 02/07/21   Elayne Snare, MD  sodium zirconium cyclosilicate (LOKELMA) 5 g packet Take 5 g by mouth once for 1 dose. 02/12/21 02/12/21  Jeanell Sparrow, DO  tiZANidine (ZANAFLEX) 2 MG tablet tizanidine 2 mg tablet  TAKE 1 TABLET BY MOUTH THREE TIMES DAILY AS NEEDED FOR MUSCLE PAIN 01/01/21   [provider]    Allergies    Codeine, Procaine hcl, Aspirin, Augmentin  [amoxicillin-pot clavulanate], Benadryl [diphenhydramine], Diflucan [fluconazole], Dilaudid [hydromorphone hcl], Other, Prednisone, Tramadol hcl, and Sulfa antibiotics  Review of Systems   Review of Systems  Constitutional:  Negative for chills and fever.  HENT:  Negative for facial swelling and trouble swallowing.   Eyes:  Negative for photophobia and visual disturbance.  Respiratory:  Negative for cough and shortness of breath.   Cardiovascular:  Negative for chest pain and palpitations.  Gastrointestinal:  Positive for abdominal pain and constipation. Negative for hematemesis, hematochezia, melena, nausea and vomiting.  Endocrine: Negative for polydipsia and polyuria.  Genitourinary:  Negative for difficulty urinating and hematuria.  Musculoskeletal:  Negative for gait problem and joint swelling.  Skin:  Negative for pallor and rash.  Neurological:  Negative for syncope and headaches.  Psychiatric/Behavioral:  Negative for agitation and confusion.    Physical Exam Updated Vital Signs BP 134/68 (BP Location: Right Arm)   Pulse 70   Temp 97.8 F (36.6 C) (Oral)   Resp 19   Ht 5\' 1"  (1.549 m)   Wt 80.3 kg   SpO2 94%   BMI 33.44 kg/m   Physical Exam Vitals and nursing note reviewed.  Constitutional:  General: She is not in acute distress.    Appearance: Normal appearance. She is well-developed.  HENT:     Head: Normocephalic and atraumatic.     Right Ear: External ear normal.     Left Ear: External ear normal.     Nose: Nose normal.     Mouth/Throat:     Mouth: Mucous membranes are moist.  Eyes:     General: No scleral icterus.       Right eye: No discharge.        Left eye: No discharge.  Cardiovascular:     Rate and Rhythm: Normal rate and regular rhythm.     Pulses: Normal pulses.     Heart sounds: Normal heart sounds.  Pulmonary:     Effort: Pulmonary effort is normal. No respiratory distress.     Breath sounds: Normal breath sounds.  Abdominal:      General: Abdomen is flat. Bowel sounds are normal.     Palpations: Abdomen is soft.     Tenderness: There is abdominal tenderness in the left lower quadrant.     Comments: Not peritoneal  Musculoskeletal:        General: Normal range of motion.     Cervical back: Normal range of motion.     Right lower leg: No edema.     Left lower leg: No edema.  Skin:    General: Skin is warm and dry.     Capillary Refill: Capillary refill takes less than 2 seconds.  Neurological:     Mental Status: She is alert.  Psychiatric:        Mood and Affect: Mood normal.        Behavior: Behavior normal.    ED Results / Procedures / Treatments   Labs (all labs ordered are listed, but only abnormal results are displayed) Labs Reviewed  CBC WITH DIFFERENTIAL/PLATELET - Abnormal; Notable for the following components:      Result Value   WBC 10.8 (*)    Neutro Abs 8.1 (*)    Abs Immature Granulocytes 0.10 (*)    All other components within normal limits  COMPREHENSIVE METABOLIC PANEL - Abnormal; Notable for the following components:   Potassium 5.3 (*)    Glucose, Bld 198 (*)    Calcium 10.5 (*)    All other components within normal limits  URINALYSIS, ROUTINE W REFLEX MICROSCOPIC - Abnormal; Notable for the following components:   Leukocytes,Ua TRACE (*)    All other components within normal limits  RESP PANEL BY RT-PCR (FLU A&B, COVID) ARPGX2  LIPASE, BLOOD    EKG EKG Interpretation  Date/Time:  Tuesday February 11 2021 10:08:45 EST Ventricular Rate:  72 PR Interval:  188 QRS Duration: 87 QT Interval:  396 QTC Calculation: 434 R Axis:   56 Text Interpretation: Sinus rhythm Low voltage, precordial leads similar to old tracings Confirmed by Wynona Dove ([id number]) on 02/11/2021 10:11:43 AM  Radiology CT ABDOMEN PELVIS W CONTRAST  Result Date: 02/11/2021 CLINICAL DATA:  Abdominal pain for 5 days EXAM: CT ABDOMEN AND PELVIS WITH CONTRAST TECHNIQUE: Multidetector CT imaging of the abdomen and  pelvis was performed using the standard protocol following bolus administration of intravenous contrast. CONTRAST:  2mL OMNIPAQUE IOHEXOL 300 MG/ML  SOLN COMPARISON:  CT scan 10/07/2020 FINDINGS: Lower chest: The lung bases are clear of acute process. No pleural effusions. Stable small subpleural nodules since 2020. The heart is normal in size. No pericardial effusion. The distal esophagus and aorta are unremarkable.  Hepatobiliary: No hepatic lesions or intrahepatic biliary dilatation. Gallbladder is unremarkable. No common bile duct dilatation. Pancreas: No mass, inflammation or ductal dilatation. Spleen: Normal size.  No focal lesions. Adrenals/Urinary Tract: The adrenal glands are normal. Chronic right UPJ obstruction. No renal lesions or renal calculi. The bladder is unremarkable. Stomach/Bowel: The stomach, duodenum, small bowel and terminal ileum are unremarkable. No acute inflammatory process, mass lesions or obstructive findings. Stable scattered colonic diverticulosis. There is a focal area of moderate wall thickening and submucosal edema involving the mid sigmoid colon consistent with mild uncomplicated diverticulitis. Vascular/Lymphatic: Stable scattered vascular calcifications but no aneurysm or dissection. The major venous structures are patent. No mesenteric or retroperitoneal mass or adenopathy. Reproductive: The uterus is surgically absent. Both ovaries are still present and appear normal. Other: No pelvic mass or adenopathy. No free pelvic fluid collections. No inguinal mass or adenopathy. No abdominal wall hernia or subcutaneous lesions. Musculoskeletal: No significant bony findings. IMPRESSION: 1. CT findings consistent with mild uncomplicated diverticulitis involving the mid sigmoid colon. 2. No other significant abdominal/pelvic findings, mass lesions or adenopathy. 3. Chronic right UPJ obstruction. Electronically Signed   By: Marijo Sanes M.D.   On: 02/11/2021 11:22     Procedures Procedures   Medications Ordered in ED Medications  sodium chloride 0.9 % bolus 1,000 mL (0 mLs Intravenous Stopped 02/11/21 1142)  ondansetron (ZOFRAN) injection 4 mg (4 mg Intravenous Given 02/11/21 1018)  fentaNYL (SUBLIMAZE) injection 50 mcg (50 mcg Intravenous Given 02/11/21 1024)  iohexol (OMNIPAQUE) 300 MG/ML solution 85 mL (85 mLs Intravenous Contrast Given 02/11/21 1049)  sodium zirconium cyclosilicate (LOKELMA) packet 10 g (10 g Oral Given 02/11/21 1134)  oxyCODONE (Oxy IR/ROXICODONE) immediate release tablet 5 mg (5 mg Oral Given 02/11/21 1147)    ED Course  I have reviewed the triage vital signs and the nursing notes.  Pertinent labs & imaging results that were available during my care of the patient were reviewed by me and considered in my medical decision making (see chart for details).    MDM Rules/Calculators/A&P                           CC: Abdominal pain  This patient complains of above; this involves an extensive number of treatment options and is a complaint that carries with it a high risk of complications and morbidity. Vital signs were reviewed. Serious etiologies considered.  Record review:   Previous records obtained and reviewed    Work up as above, notable for:  Labs & imaging results that were available during my care of the patient were reviewed by me and considered in my medical decision making.   I ordered imaging studies which included CT abdomen pelvis and I independently visualized and interpreted imaging which showed uncomplicated diverticulitis  Management: Patient given IV fluids, Zofran, Toradol.  She has multiple analgesic allergies.  Reassessment:  Patient ports overall that she is feeling much better, she is able tolerate oral intake without difficulty.  No nausea or vomiting.  Discussed supportive care the patient including analgesics, clear liquid diet.  Close patient follow-up PCP on Thursday.   Potassium mildly  elevated, given Lokelma.  Advised to follow PCP in 2 days repeat potassium level.  No EKG changes.  Strict return precautions were discussed.  The patient improved significantly and was discharged in stable condition. Detailed discussions were had with the patient regarding current findings, and need for close f/u with PCP or on call  doctor. The patient has been instructed to return immediately if the symptoms worsen in any way for re-evaluation. Patient verbalized understanding and is in agreement with current care plan. All questions answered prior to discharge.           This chart was dictated using voice recognition software.  Despite best efforts to proofread,  errors can occur which can change the documentation meaning.  Final Clinical Impression(s) / ED Diagnoses Final diagnoses:  Abdominal pain, unspecified abdominal location  Diverticulitis    Rx / DC Orders ED Discharge Orders          Ordered    sodium zirconium cyclosilicate (LOKELMA) 5 g packet   Once,   Status:  Discontinued        02/11/21 1202    sodium zirconium cyclosilicate (LOKELMA) 5 g packet   Once        02/11/21 1203    oxyCODONE (ROXICODONE) 5 MG immediate release tablet  Every 6 hours PRN        02/11/21 1201    acetaminophen (TYLENOL) 325 MG tablet  Every 6 hours PRN        02/11/21 1201             Jeanell Sparrow, DO 02/11/21 1208

## 2021-02-12 DIAGNOSIS — Z8719 Personal history of other diseases of the digestive system: Secondary | ICD-10-CM | POA: Diagnosis not present

## 2021-02-12 DIAGNOSIS — K5732 Diverticulitis of large intestine without perforation or abscess without bleeding: Secondary | ICD-10-CM | POA: Diagnosis not present

## 2021-02-12 DIAGNOSIS — K5900 Constipation, unspecified: Secondary | ICD-10-CM | POA: Diagnosis not present

## 2021-02-13 ENCOUNTER — Other Ambulatory Visit: Payer: Medicare Other

## 2021-02-15 ENCOUNTER — Encounter (HOSPITAL_BASED_OUTPATIENT_CLINIC_OR_DEPARTMENT_OTHER): Payer: Self-pay

## 2021-02-15 ENCOUNTER — Other Ambulatory Visit: Payer: Self-pay

## 2021-02-15 ENCOUNTER — Emergency Department (HOSPITAL_BASED_OUTPATIENT_CLINIC_OR_DEPARTMENT_OTHER): Payer: Medicare Other

## 2021-02-15 ENCOUNTER — Emergency Department (HOSPITAL_BASED_OUTPATIENT_CLINIC_OR_DEPARTMENT_OTHER)
Admission: EM | Admit: 2021-02-15 | Discharge: 2021-02-15 | Disposition: A | Discharge status: Home / Self Care | Payer: Medicare Other | Attending: Emergency Medicine | Admitting: Emergency Medicine

## 2021-02-15 DIAGNOSIS — Z79899 Other long term (current) drug therapy: Secondary | ICD-10-CM | POA: Diagnosis not present

## 2021-02-15 DIAGNOSIS — R1032 Left lower quadrant pain: Secondary | ICD-10-CM | POA: Insufficient documentation

## 2021-02-15 DIAGNOSIS — K5900 Constipation, unspecified: Secondary | ICD-10-CM | POA: Insufficient documentation

## 2021-02-15 DIAGNOSIS — E119 Type 2 diabetes mellitus without complications: Secondary | ICD-10-CM | POA: Insufficient documentation

## 2021-02-15 DIAGNOSIS — E039 Hypothyroidism, unspecified: Secondary | ICD-10-CM | POA: Insufficient documentation

## 2021-02-15 DIAGNOSIS — R109 Unspecified abdominal pain: Secondary | ICD-10-CM | POA: Diagnosis not present

## 2021-02-15 DIAGNOSIS — I10 Essential (primary) hypertension: Secondary | ICD-10-CM | POA: Diagnosis not present

## 2021-02-15 DIAGNOSIS — K5792 Diverticulitis of intestine, part unspecified, without perforation or abscess without bleeding: Secondary | ICD-10-CM | POA: Diagnosis not present

## 2021-02-15 DIAGNOSIS — Z96641 Presence of right artificial hip joint: Secondary | ICD-10-CM | POA: Diagnosis not present

## 2021-02-15 DIAGNOSIS — Z96652 Presence of left artificial knee joint: Secondary | ICD-10-CM | POA: Insufficient documentation

## 2021-02-15 LAB — COMPREHENSIVE METABOLIC PANEL
ALT: 17 U/L (ref 0–44)
AST: 17 U/L (ref 15–41)
Albumin: 3.8 g/dL (ref 3.5–5.0)
Alkaline Phosphatase: 85 U/L (ref 38–126)
Anion gap: 10 (ref 5–15)
BUN: 11 mg/dL (ref 8–23)
CO2: 24 mmol/L (ref 22–32)
Calcium: 10.2 mg/dL (ref 8.9–10.3)
Chloride: 105 mmol/L (ref 98–111)
Creatinine, Ser: 0.9 mg/dL (ref 0.44–1.00)
GFR, Estimated: >60 mL/min (ref >60)
Glucose, Bld: 205 mg/dL — ABNORMAL HIGH (ref 70–99)
Potassium: 3.8 mmol/L (ref 3.5–5.1)
Sodium: 139 mmol/L (ref 135–145)
Total Bilirubin: 0.5 mg/dL (ref 0.3–1.2)
Total Protein: 6.5 g/dL (ref 6.5–8.1)

## 2021-02-15 LAB — CBC WITH DIFFERENTIAL/PLATELET
Abs Immature Granulocytes: 0.05 10*3/uL (ref 0.00–0.07)
Basophils Absolute: 0 10*3/uL (ref 0.0–0.1)
Basophils Relative: 0 %
Eosinophils Absolute: 0.1 10*3/uL (ref 0.0–0.5)
Eosinophils Relative: 1 %
HCT: 41.4 % (ref 36.0–46.0)
Hemoglobin: 12.9 g/dL (ref 12.0–15.0)
Immature Granulocytes: 1 %
Lymphocytes Relative: 18 %
Lymphs Abs: 1.7 10*3/uL (ref 0.7–4.0)
MCH: 29 pg (ref 26.0–34.0)
MCHC: 31.2 g/dL (ref 30.0–36.0)
MCV: 93 fL (ref 80.0–100.0)
Monocytes Absolute: 0.8 10*3/uL (ref 0.1–1.0)
Monocytes Relative: 8 %
Neutro Abs: 7.1 10*3/uL (ref 1.7–7.7)
Neutrophils Relative %: 72 %
Platelets: 218 10*3/uL (ref 150–400)
RBC: 4.45 MIL/uL (ref 3.87–5.11)
RDW: 14.1 % (ref 11.5–15.5)
WBC: 9.8 10*3/uL (ref 4.0–10.5)
nRBC: 0 % (ref 0.0–0.2)

## 2021-02-15 LAB — LACTIC ACID, PLASMA: Lactic Acid, Venous: 1.6 mmol/L (ref 0.5–1.9)

## 2021-02-15 LAB — LIPASE, BLOOD: Lipase: 17 U/L (ref 11–51)

## 2021-02-15 MED ORDER — DICYCLOMINE HCL 20 MG PO TABS: 20.0000 mg | ORAL_TABLET | Freq: Two times a day (BID) | ORAL | 0 refills | Status: DC | PRN

## 2021-02-15 MED ORDER — ONDANSETRON HCL 4 MG/2ML IJ SOLN
4.0000 mg | Freq: Once | INTRAMUSCULAR | Status: AC
  Administered 2021-02-15: 4 mg via INTRAVENOUS
  Filled 2021-02-15: qty 2

## 2021-02-15 MED ORDER — METRONIDAZOLE 500 MG PO TABS: 500.0000 mg | ORAL_TABLET | Freq: Two times a day (BID) | ORAL | 0 refills | Status: DC

## 2021-02-15 MED ORDER — OXYCODONE HCL 5 MG PO TABS
5.0000 mg | ORAL_TABLET | Freq: Once | ORAL | Status: AC
  Administered 2021-02-15: 5 mg via ORAL
  Filled 2021-02-15: qty 1

## 2021-02-15 MED ORDER — ONDANSETRON HCL 4 MG PO TABS: 4.0000 mg | ORAL_TABLET | Freq: Three times a day (TID) | ORAL | 0 refills | Status: DC | PRN

## 2021-02-15 MED ORDER — OXYCODONE HCL 5 MG PO TABS
5.0000 mg | ORAL_TABLET | Freq: Four times a day (QID) | ORAL | 0 refills | Status: DC | PRN
Start: 2021-02-15 — End: 2021-06-17

## 2021-02-15 MED ORDER — FENTANYL CITRATE PF 50 MCG/ML IJ SOSY
50.0000 ug | PREFILLED_SYRINGE | Freq: Once | INTRAMUSCULAR | Status: AC
  Administered 2021-02-15: 50 ug via INTRAVENOUS
  Filled 2021-02-15: qty 1

## 2021-02-15 MED ORDER — IOHEXOL 300 MG/ML  SOLN
100.0000 mL | Freq: Once | INTRAMUSCULAR | Status: AC | PRN
  Administered 2021-02-15: 100 mL via INTRAVENOUS

## 2021-02-15 MED ORDER — SODIUM CHLORIDE 0.9 % IV BOLUS
1000.0000 mL | Freq: Once | INTRAVENOUS | Status: AC
  Administered 2021-02-15: 1000 mL via INTRAVENOUS

## 2021-02-15 NOTE — ED Provider Notes (Signed)
[facility] EMERGENCY DEPT Provider Note   CSN: [id number] Arrival date & time: 02/15/21  0550     History Chief Complaint  Patient presents with   Abdominal Pain    MELISA DONOFRIO is a 75 y.o. female.  Patient with a history of recurrent diverticulitis, fibromyalgia, C. difficile, diabetes, arthritis returns with worsening left-sided lower abdominal pain.  She was seen for the same pain in the ED in December 6 and diagnosed with a case of diverticulitis.  She was given pain and nausea control but no antibiotics at the time.  She saw her gastroenterologist the next day was prescribed ceftin which she has had 2 doses.  She returns with worsening lower abdominal pain especially the left side associate with nausea and 1 episode of vomiting last night.  Also believes she could have a bowel obstruction and is constipated and has not had a bowel movement for 3 to 4 days.  She is still passing gas.  She states this feels similar to previous episodes of diverticulitis but has never been this severe.  No pain with urination or blood in the urine.  No vaginal bleeding or discharge.  No chest pain or shortness of breath.  Denies any recent diarrhea.  Denies any blood in the stool.  Denies any vomiting of the 1 episode last night but has had a poor appetite. No reported fever She took her last dose of oxycodone this morning at 430  The history is provided by the patient.  Abdominal Pain Associated symptoms: constipation, nausea and vomiting   Associated symptoms: no cough, no dysuria, no fatigue, no fever, no hematuria and no shortness of breath       Past Medical History:  Diagnosis Date   Alcohol abuse    Anxiety    takes Valium daily as needed and Ativan daily   Bilateral hearing loss    Bipolar 1 disorder ([place])    takes Lithium nightly and Synthroid daily   Chronic back pain    DDD; "all over" (09/14/2017)   Colitis, ischemic ([place]) 2012   Confusion    r/t meds    Depression    takes Prozac daily and Bupspirone    Diverticulosis    Dyslipidemia    takes Crestor daily   Fibromyalgia    Gastroparesis    Headache    "weekly" (09/14/2017)   Hepatic steatosis 06/18/12   severe   Hyperlipidemia    Hypertension    Hypothyroidism    IBS (irritable bowel syndrome)    Ischemic colitis ([place])    Joint pain    Joint swelling    Lupus erythematosus tumidus    tumid-skin   Migraine    "1-2/yr; maybe" (09/14/2017)   Numbness    in right foot   Osteoarthritis    "all over" (09/14/2017)   Osteoarthritis cervical spine    Osteoarthritis of hand    bilateral   Pneumonia    "walking pneumonia several times; long time since the last time" (09/14/2017)   Restless leg syndrome    takes Requip nightly   Sciatica    Type II diabetes mellitus ([name])    Urinary frequency    Urinary leakage    Urinary urgency    Urinary, incontinence, stress female    Walking pneumonia    last time more than 61yrs ago    Patient Active Problem List   Diagnosis Date Noted   Type 2 diabetes mellitus with diabetic polyneuropathy, with long-term current use  of insulin ([place]) 01/27/2021   Memory disorder 01/27/2021   Bipolar 1 disorder, depressed ([place]) 01/27/2021   PTSD (post-traumatic stress disorder) 01/27/2021   Intractable abdominal pain 07/10/2020   Pancreatic abnormality 07/10/2020   Diabetes mellitus type 2 in obese ([place]) 07/10/2020   Essential hypertension 07/10/2020   Bipolar 1 disorder, mixed, moderate ([place])    Suicidal ideations    Overweight (BMI 25.0-29.9) 02/08/2019   S/P left TKA 02/07/2019   Chest pain    Chest pain with normal coronary angiography    Diverticulitis 11/19/2016   Hypothyroidism 07/14/2016   Diarrhea    Generalized abdominal pain    Major depressive disorder, recurrent severe without psychotic features ([place]) 12/31/2015   Nausea without vomiting    Colitis 10/28/2015   Unintentional poisoning by psychotropic drug 07/05/2015   Restless leg  syndrome 07/05/2015   Peripheral sensory neuropathy due to type 2 diabetes mellitus ([place]) 09/21/2014   Arthritis of left hip 06/09/2013   Arthritis of right hip 06/08/2013   Hip pain, right 05/02/2013   IBS (irritable bowel syndrome) 01/02/2013   Unspecified vitamin D deficiency 07/05/2012   Pure hyperglyceridemia 07/04/2012   Diabetes mellitus with diabetic neuropathy, without long-term current use of insulin ([place]) 06/18/2012   Hypertension 06/18/2012   Bipolar 1 disorder ([place]) 06/18/2012   Low back pain 02/25/2011   Acute ischemic colitis ([place]) 01/06/2011   Sciatica 12/19/2010   Fibromyalgia 10/28/2010   Primary osteoarthritis of right hip 10/24/2010   Hyperlipidemia with target low density lipoprotein (LDL) cholesterol less than 100 mg/dL 05/05/2010   Anxiety and depression 05/05/2010   ABUSE, ALCOHOL, IN REMISSION 05/05/2010   OTITIS MEDIA, CHRONIC 05/05/2010   Hearing loss 05/05/2010   ALLERGIC RHINITIS, SEASONAL, MILD 05/05/2010   URINARY INCONTINENCE, STRESS, FEMALE 05/05/2010   OSTEOARTHRITIS, HANDS, BILATERAL 05/05/2010   OSTEOARTHRITIS, CERVICAL SPINE 05/05/2010    Past Surgical History:  Procedure Laterality Date   ABDOMINAL HYSTERECTOMY     "they left my ovaries"   APPENDECTOMY     BALLOON DILATION N/A 06/14/2020   Procedure: BALLOON DILATION;  Surgeon: Ronnette Juniper, MD;  Location: [facility];  Service: Gastroenterology;  Laterality: N/A;   BIOPSY  06/14/2020   Procedure: BIOPSY;  Surgeon: Ronnette Juniper, MD;  Location: [facility];  Service: Gastroenterology;;   CARDIAC CATHETERIZATION  09/14/2017   COLONOSCOPY     DENTAL SURGERY Left 10/2016   dental implant   DILATION AND CURETTAGE OF UTERUS  X 4   ESOPHAGOGASTRODUODENOSCOPY     ESOPHAGOGASTRODUODENOSCOPY (EGD) WITH PROPOFOL N/A 06/14/2020   Procedure: ESOPHAGOGASTRODUODENOSCOPY (EGD) WITH PROPOFOL;  Surgeon: Ronnette Juniper, MD;  Location: [facility];  Service: Gastroenterology;  Laterality: N/A;   FLEXIBLE  SIGMOIDOSCOPY N/A 06/21/2012   Procedure: FLEXIBLE SIGMOIDOSCOPY;  Surgeon: Jerene Bears, MD;  Location: [facility];  Service: Gastroenterology;  Laterality: N/A;   JOINT REPLACEMENT     LEFT HEART CATH AND CORONARY ANGIOGRAPHY N/A 09/14/2017   Procedure: LEFT HEART CATH AND CORONARY ANGIOGRAPHY;  Surgeon: Belva Crome, MD;  Location: [facility];  Service: Cardiovascular;  Laterality: N/A;   SHOULDER ARTHROSCOPY Right    "shaved spurs off rotator cuff"   TONSILLECTOMY     TOTAL HIP ARTHROPLASTY Right 06/09/2013   Procedure: TOTAL HIP ARTHROPLASTY;  Surgeon: Kerin Salen, MD;  Location: [facility];  Service: Orthopedics;  Laterality: Right;   TOTAL KNEE ARTHROPLASTY Left 02/07/2019   Procedure: TOTAL KNEE ARTHROPLASTY;  Surgeon: Paralee Cancel, MD;  Location: [facility];  Service: Orthopedics;  Laterality: Left;  70 mins   TUBAL LIGATION     TUMOR EXCISION Right 1968   angle of jaw; benign     OB History     Gravida  2   Para  2   Term      Preterm      AB      Living         SAB      IAB      Ectopic      Multiple      Live Births              Family History  Problem Relation Age of Onset   Drug abuse Mother    Alcohol abuse Mother    Alcohol abuse Father    Hypertension Father    CAD Brother    Hypertension Brother    Alcohol abuse Brother    Hypertension Brother    Hypertension Brother    Psoriasis Daughter    Arthritis Daughter        psoriatic arthritis    Alcohol abuse Grandchild     Social History   Tobacco Use   Smoking status: Never   Smokeless tobacco: Never  Vaping Use   Vaping Use: Never used  Substance Use Topics   Alcohol use: Not Currently    Comment: 09/14/2017 "nothing since 04/15/2008"   Drug use: Not Currently    Frequency: 7.0 times per week    Types: Benzodiazepines    Home Medications Prior to Admission medications   Medication Sig Start Date End Date Taking? Authorizing Provider  acetaminophen (TYLENOL) 325 MG tablet  Take 2 tablets (650 mg total) by mouth every 6 (six) hours as needed. 02/11/21   Jeanell Sparrow, DO  ALPRAZolam Duanne Moron) 0.5 MG tablet Take 1 tablet (0.5 mg total) by mouth 4 (four) times daily as needed for anxiety. 02/19/21   [name], PMHNP  atorvastatin (LIPITOR) 40 MG tablet Take 40 mg by mouth every evening.    [provider]  b complex vitamins capsule Take 1 capsule by mouth daily.    [provider]  BD PEN NEEDLE NANO 2ND GEN 32G X 4 MM MISC USE TO INJECT 5 TIMES DAILY AS DIRECTED 09/20/20   Elayne Snare, MD  cholecalciferol (VITAMIN D3) 25 MCG (1000 UNIT) tablet Take 3,000 Units by mouth daily.    [provider]  divalproex (DEPAKOTE) 250 MG DR tablet Take 2 tablets (500 mg total) by mouth at bedtime. 01/10/21   [name], PMHNP  donepezil (ARICEPT) 10 MG tablet Take 1 tablet (10 mg total) by mouth at bedtime. 12/03/20   [name], PMHNP  gabapentin (NEURONTIN) 600 MG tablet Take 600 mg by mouth 3 (three) times daily. 05/12/19   [provider]  glucose blood (CONTOUR NEXT TEST) test strip USE TO TEST BLOOD SUGAR TWICE DAILY 06/20/20   Elayne Snare, MD  LANTUS SOLOSTAR 100 UNIT/ML Solostar Pen ADMINISTER 64 UNITS UNDER THE SKIN AT BEDTIME 01/13/21   Elayne Snare, MD  levothyroxine (SYNTHROID) 75 MCG tablet TAKE 1 TABLET BY MOUTH EVERY DAY BEFORE BREAKFAST 12/26/20   Elayne Snare, MD  liothyronine (CYTOMEL) 5 MCG tablet Take 1 tablet (5 mcg total) by mouth daily. Take with levothyroxine before breakfast 12/26/20   Elayne Snare, MD  lithium carbonate (ESKALITH) 450 MG CR tablet Take 1 tablet (450 mg total) by mouth at bedtime. 01/10/21   [name], PMHNP  loperamide (IMODIUM A-D) 2 MG  tablet Take 2 mg by mouth 2 (two) times daily as needed for diarrhea or loose stools.    [provider]  losartan (COZAAR) 25 MG tablet Take 25 mg by mouth daily. 08/31/19   [provider]  Melatonin 10 MG TBCR Take 10 mg by mouth at bedtime.     [provider]  memantine (NAMENDA) 10 MG tablet TAKE 1 TABLET(10 MG) BY MOUTH EVERY EVENING 01/10/21   [name], PMHNP  Microlet Lancets MISC TEST DAILY AS DIRECTED 06/20/20   Elayne Snare, MD  nitroGLYCERIN (NITROSTAT) 0.4 MG SL tablet Place 1 tablet (0.4 mg total) under the tongue every 5 (five) minutes as needed for chest pain. 09/14/17   Lyda Jester M, PA-C  OLANZapine (ZYPREXA) 2.5 MG tablet Take 1 tablet (2.5 mg total) by mouth at bedtime. 01/29/21   [name], PMHNP  ondansetron (ZOFRAN) 4 MG tablet Take 4 mg by mouth every 8 (eight) hours as needed for nausea or vomiting. 11/26/19   [provider]  oxyCODONE (ROXICODONE) 5 MG immediate release tablet Take 1 tablet (5 mg total) by mouth every 6 (six) hours as needed for severe pain. 02/11/21   Jeanell Sparrow, DO  Probiotic Product (PROBIOTIC ADVANCED PO) Take by mouth 2 (two) times daily.    [provider]  QUEtiapine (SEROQUEL) 200 MG tablet TAKE 1 TAB PO q 5 pm and 1 tab po QHS 01/10/21   [name], PMHNP  rOPINIRole (REQUIP) 0.5 MG tablet Takes 4 tabs at 5 pm and 3 tabs at bedtime and 1-2 tabs prn 01/10/21   [name], PMHNP  Semaglutide, 2 MG/DOSE, (OZEMPIC, 2 MG/DOSE,) 8 MG/3ML SOPN Inject 2 mg weekly 02/07/21   Elayne Snare, MD  tiZANidine (ZANAFLEX) 2 MG tablet tizanidine 2 mg tablet  TAKE 1 TABLET BY MOUTH THREE TIMES DAILY AS NEEDED FOR MUSCLE PAIN 01/01/21   [provider]    Allergies    Codeine, Procaine hcl, Aspirin, Augmentin [amoxicillin-pot clavulanate], Benadryl [diphenhydramine], Diflucan [fluconazole], Dilaudid [hydromorphone hcl], Other, Prednisone, Tramadol hcl, and Sulfa antibiotics  Review of Systems   Review of Systems  Constitutional:  Positive for activity change and appetite change. Negative for fatigue and fever.  HENT:  Negative for congestion and rhinorrhea.   Eyes:  Negative for visual disturbance.  Respiratory:  Negative for cough, chest  tightness and shortness of breath.   Gastrointestinal:  Positive for abdominal pain, constipation, nausea and vomiting.  Genitourinary:  Negative for dysuria and hematuria.  Musculoskeletal:  Negative for arthralgias and myalgias.  Skin:  Negative for rash.  Neurological:  Negative for dizziness, weakness and headaches.   all other systems are negative except as noted in the HPI and PMH.   Physical Exam Updated Vital Signs BP (!) 142/66 (BP Location: Right Arm)   Pulse 92   Temp 98.2 F (36.8 C) (Oral)   Resp 16   Ht 5\' 1"  (1.549 m)   Wt 80.3 kg   SpO2 92%   BMI 33.44 kg/m   Physical Exam Vitals and nursing note reviewed.  Constitutional:      General: She is not in acute distress.    Appearance: She is well-developed.     Comments: uncomfortable  HENT:     Head: Normocephalic and atraumatic.     Mouth/Throat:     Pharynx: No oropharyngeal exudate.  Eyes:     Conjunctiva/sclera: Conjunctivae normal.     Pupils: Pupils are equal, round, and reactive to light.  Neck:  Comments: No meningismus. Cardiovascular:     Rate and Rhythm: Normal rate and regular rhythm.     Heart sounds: Normal heart sounds. No murmur heard. Pulmonary:     Effort: Pulmonary effort is normal. No respiratory distress.     Breath sounds: Normal breath sounds.  Abdominal:     Palpations: Abdomen is soft.     Tenderness: There is abdominal tenderness. There is guarding. There is no rebound.     Comments: TTP LLQ and periumbilical with voluntary guarding  Musculoskeletal:        General: No tenderness. Normal range of motion.     Cervical back: Normal range of motion and neck supple.  Skin:    General: Skin is warm.  Neurological:     Mental Status: She is alert and oriented to person, place, and time.     Cranial Nerves: No cranial nerve deficit.     Motor: No abnormal muscle tone.     Coordination: Coordination normal.     Comments:  5/5 strength throughout. CN 2-12 intact.Equal grip  strength.   Psychiatric:        Behavior: Behavior normal.    ED Results / Procedures / Treatments   Labs (all labs ordered are listed, but only abnormal results are displayed) Labs Reviewed  COMPREHENSIVE METABOLIC PANEL - Abnormal; Notable for the following components:      Result Value   Glucose, Bld 205 (*)    All other components within normal limits  LACTIC ACID, PLASMA  CBC WITH DIFFERENTIAL/PLATELET  LIPASE, BLOOD  LACTIC ACID, PLASMA  URINALYSIS, ROUTINE W REFLEX MICROSCOPIC    EKG None  Radiology No results found.  Procedures Procedures   Medications Ordered in ED Medications  sodium chloride 0.9 % bolus 1,000 mL (has no administration in time range)  ondansetron (ZOFRAN) injection 4 mg (has no administration in time range)  fentaNYL (SUBLIMAZE) injection 50 mcg (has no administration in time range)    ED Course  I have reviewed the triage vital signs and the nursing notes.  Pertinent labs & imaging results that were available during my care of the patient were reviewed by me and considered in my medical decision making (see chart for details).    MDM Rules/Calculators/A&P                          Lower abdominal pain with nausea and vomiting.  Recently treated for diverticulitis with Ceftin has had 2 days of antibiotics.  Returns with worsening pain and vomiting.  Vital stable, no distress  Patient given IV fluids, pain and nausea medications.  Lactate is normal.  White blood cell count is normal.  Labs are otherwise reassuring.  We will repeat imaging given her worsening pain with vomiting and obstipation.  May need admission for failure of outpatient treatment of diverticulitis versus other acute pathology depending on CT results.   Care to be transferred at shift change to Dr. Regenia Skeeter. Final Clinical Impression(s) / ED Diagnoses Final diagnoses:  None    Rx / DC Orders ED Discharge Orders     None        Cambell Stanek, Annie Main,  MD 02/15/21 [phone]

## 2021-02-15 NOTE — ED Notes (Signed)
Pt became hypoxic after administration of fentanyl. Pt placed on 3L [place]

## 2021-02-15 NOTE — Discharge Instructions (Addendum)
If you develop worsening, continued, or recurrent abdominal pain, uncontrolled vomiting, fever, chest or back pain, or any other new/concerning symptoms then return to the ER for evaluation.  

## 2021-02-15 NOTE — ED Notes (Signed)
Patient transported to CT 

## 2021-02-15 NOTE — ED Provider Notes (Addendum)
Patient CT results and images have been personally reviewed.  To me her diverticulitis looks a little more inflamed but there is no obvious complication.  She is hemodynamically stable.  Had a soft blood pressure when I got here but now it is 128 systolic.  She feels well enough to go home if she has pain and nausea control.  I did briefly discussed with pharmacy and with Ceftin there is not as great anaerobic coverage so we will add Flagyl.  We discussed hospitalization but patient and I agree I think we can hold off on this.  However we did discuss return precautions.  Will discharge home with pain, nausea control and the Flagyl.   Sherwood Gambler, MD 02/15/21 [phone]  Patient is also asking for something for spasms so we will add on Bentyl.    Sherwood Gambler, MD 02/15/21 [phone]

## 2021-02-15 NOTE — ED Triage Notes (Signed)
Abdominal pain and vomiting for 3 days. Was seen here recently for same problem and diagnosed with diverticulitis.

## 2021-02-19 ENCOUNTER — Emergency Department (HOSPITAL_BASED_OUTPATIENT_CLINIC_OR_DEPARTMENT_OTHER): Payer: Medicare Other

## 2021-02-19 ENCOUNTER — Encounter (HOSPITAL_BASED_OUTPATIENT_CLINIC_OR_DEPARTMENT_OTHER): Payer: Self-pay

## 2021-02-19 ENCOUNTER — Emergency Department (HOSPITAL_BASED_OUTPATIENT_CLINIC_OR_DEPARTMENT_OTHER)
Admission: EM | Admit: 2021-02-19 | Discharge: 2021-02-19 | Disposition: A | Discharge status: Home / Self Care | Payer: Medicare Other | Attending: Emergency Medicine | Admitting: Emergency Medicine

## 2021-02-19 ENCOUNTER — Other Ambulatory Visit: Payer: Self-pay

## 2021-02-19 DIAGNOSIS — Z96641 Presence of right artificial hip joint: Secondary | ICD-10-CM | POA: Insufficient documentation

## 2021-02-19 DIAGNOSIS — E039 Hypothyroidism, unspecified: Secondary | ICD-10-CM | POA: Diagnosis not present

## 2021-02-19 DIAGNOSIS — R2242 Localized swelling, mass and lump, left lower limb: Secondary | ICD-10-CM | POA: Diagnosis not present

## 2021-02-19 DIAGNOSIS — M79605 Pain in left leg: Secondary | ICD-10-CM | POA: Diagnosis not present

## 2021-02-19 DIAGNOSIS — I10 Essential (primary) hypertension: Secondary | ICD-10-CM | POA: Insufficient documentation

## 2021-02-19 DIAGNOSIS — E119 Type 2 diabetes mellitus without complications: Secondary | ICD-10-CM | POA: Diagnosis not present

## 2021-02-19 DIAGNOSIS — M5432 Sciatica, left side: Secondary | ICD-10-CM | POA: Insufficient documentation

## 2021-02-19 DIAGNOSIS — M79652 Pain in left thigh: Secondary | ICD-10-CM | POA: Insufficient documentation

## 2021-02-19 DIAGNOSIS — Z96652 Presence of left artificial knee joint: Secondary | ICD-10-CM | POA: Insufficient documentation

## 2021-02-19 DIAGNOSIS — M79662 Pain in left lower leg: Secondary | ICD-10-CM | POA: Diagnosis not present

## 2021-02-19 MED ORDER — KETOROLAC TROMETHAMINE 30 MG/ML IJ SOLN
15.0000 mg | Freq: Once | INTRAMUSCULAR | Status: AC
  Administered 2021-02-19: 04:00:00 15 mg via INTRAVENOUS
  Filled 2021-02-19: qty 1

## 2021-02-19 MED ORDER — ONDANSETRON HCL 4 MG/2ML IJ SOLN
4.0000 mg | Freq: Once | INTRAMUSCULAR | Status: AC
  Administered 2021-02-19: 04:00:00 4 mg via INTRAVENOUS
  Filled 2021-02-19: qty 2

## 2021-02-19 MED ORDER — OXYCODONE HCL 5 MG PO TABS
10.0000 mg | ORAL_TABLET | Freq: Once | ORAL | Status: AC
  Administered 2021-02-19: 04:00:00 10 mg via ORAL
  Filled 2021-02-19: qty 2

## 2021-02-19 MED ORDER — TIZANIDINE HCL 4 MG PO TABS: 4.0000 mg | ORAL_TABLET | Freq: Four times a day (QID) | ORAL | 0 refills | Status: DC | PRN

## 2021-02-19 MED ORDER — CELECOXIB 100 MG PO CAPS: 100.0000 mg | ORAL_CAPSULE | Freq: Two times a day (BID) | ORAL | 0 refills | Status: DC

## 2021-02-19 MED ORDER — OXYCODONE HCL 5 MG PO TABS: 5.0000 mg | ORAL_TABLET | ORAL | 0 refills | Status: DC | PRN

## 2021-02-19 MED ORDER — METHOCARBAMOL 1000 MG/10ML IJ SOLN
1000.0000 mg | Freq: Once | INTRAMUSCULAR | Status: AC
  Administered 2021-02-19: 04:00:00 1000 mg via INTRAMUSCULAR
  Filled 2021-02-19: qty 10

## 2021-02-19 NOTE — Discharge Instructions (Signed)
Apply ice for 30 minutes at a time, 4 times a day.  You may add acetaminophen as needed to get some additional pain relief.  Return if symptoms are getting worse or are not being adequately controlled at home.

## 2021-02-19 NOTE — ED Provider Notes (Signed)
[facility] DEPT Provider Note   CSN: [id number] Arrival date & time: 02/19/21  0300     History Chief Complaint  Patient presents with   Leg Swelling    [name] [name] is a 75 y.o. female.  The history is provided by the patient.  She has history of bipolar disorder, chronic back pain, hypertension, hyperlipidemia comes in with pain in her left leg which radiates from her knee up to her thigh and into her back fairly typical of her sciatica.  Pain started about 3 days ago and is getting worse.  She is now having difficulty walking, even with her walker.  Pain is rated at 8/10.  She has taken oxycodone at home without relief.  She is currently being treated for diverticulitis.  She has also noted some asymmetric swelling of her legs with the left leg more swollen than the right.  This is not unusual since she has had knee replacement on the left.  She denies bowel or bladder dysfunction, saddle anesthesia.  She does have neuropathy of her left foot and has not noticed any change in her chronic numbness.   Past Medical History:  Diagnosis Date   Alcohol abuse    Anxiety    takes Valium daily as needed and Ativan daily   Bilateral hearing loss    Bipolar 1 disorder ([place])    takes Lithium nightly and Synthroid daily   Chronic back pain    DDD; "all over" (09/14/2017)   Colitis, ischemic ([place]) 2012   Confusion    r/t meds   Depression    takes Prozac daily and Bupspirone    Diverticulosis    Dyslipidemia    takes Crestor daily   Fibromyalgia    Gastroparesis    Headache    "weekly" (09/14/2017)   Hepatic steatosis 06/18/12   severe   Hyperlipidemia    Hypertension    Hypothyroidism    IBS (irritable bowel syndrome)    Ischemic colitis ([place])    Joint pain    Joint swelling    Lupus erythematosus tumidus    tumid-skin   Migraine    "1-2/yr; maybe" (09/14/2017)   Numbness    in right foot   Osteoarthritis    "all over" (09/14/2017)    Osteoarthritis cervical spine    Osteoarthritis of hand    bilateral   Pneumonia    "walking pneumonia several times; long time since the last time" (09/14/2017)   Restless leg syndrome    takes Requip nightly   Sciatica    Type II diabetes mellitus ([place])    Urinary frequency    Urinary leakage    Urinary urgency    Urinary, incontinence, stress female    Walking pneumonia    last time more than 9yrs ago    Patient Active Problem List   Diagnosis Date Noted   Type 2 diabetes mellitus with diabetic polyneuropathy, with long-term current use of insulin ([place]) 01/27/2021   Memory disorder 01/27/2021   Bipolar 1 disorder, depressed ([place]) 01/27/2021   PTSD (post-traumatic stress disorder) 01/27/2021   Intractable abdominal pain 07/10/2020   Pancreatic abnormality 07/10/2020   Diabetes mellitus type 2 in obese ([place]) 07/10/2020   Essential hypertension 07/10/2020   Bipolar 1 disorder, mixed, moderate ([place])    Suicidal ideations    Overweight (BMI 25.0-29.9) 02/08/2019   S/P left TKA 02/07/2019   Chest pain    Chest pain with normal coronary angiography    Diverticulitis 11/19/2016  Hypothyroidism 07/14/2016   Diarrhea    Generalized abdominal pain    Major depressive disorder, recurrent severe without psychotic features ([place]) 12/31/2015   Nausea without vomiting    Colitis 10/28/2015   Unintentional poisoning by psychotropic drug 07/05/2015   Restless leg syndrome 07/05/2015   Peripheral sensory neuropathy due to type 2 diabetes mellitus ([place]) 09/21/2014   Arthritis of left hip 06/09/2013   Arthritis of right hip 06/08/2013   Hip pain, right 05/02/2013   IBS (irritable bowel syndrome) 01/02/2013   Unspecified vitamin D deficiency 07/05/2012   Pure hyperglyceridemia 07/04/2012   Diabetes mellitus with diabetic neuropathy, without long-term current use of insulin ([place]) 06/18/2012   Hypertension 06/18/2012   Bipolar 1 disorder ([place]) 06/18/2012   Low back pain 02/25/2011    Acute ischemic colitis ([place]) 01/06/2011   Sciatica 12/19/2010   Fibromyalgia 10/28/2010   Primary osteoarthritis of right hip 10/24/2010   Hyperlipidemia with target low density lipoprotein (LDL) cholesterol less than 100 mg/dL 05/05/2010   Anxiety and depression 05/05/2010   ABUSE, ALCOHOL, IN REMISSION 05/05/2010   OTITIS MEDIA, CHRONIC 05/05/2010   Hearing loss 05/05/2010   ALLERGIC RHINITIS, SEASONAL, MILD 05/05/2010   URINARY INCONTINENCE, STRESS, FEMALE 05/05/2010   OSTEOARTHRITIS, HANDS, BILATERAL 05/05/2010   OSTEOARTHRITIS, CERVICAL SPINE 05/05/2010    Past Surgical History:  Procedure Laterality Date   ABDOMINAL HYSTERECTOMY     "they left my ovaries"   APPENDECTOMY     BALLOON DILATION N/A 06/14/2020   Procedure: BALLOON DILATION;  Surgeon: Ronnette Juniper, MD;  Location: [facility];  Service: Gastroenterology;  Laterality: N/A;   BIOPSY  06/14/2020   Procedure: BIOPSY;  Surgeon: Ronnette Juniper, MD;  Location: [facility];  Service: Gastroenterology;;   CARDIAC CATHETERIZATION  09/14/2017   COLONOSCOPY     DENTAL SURGERY Left 10/2016   dental implant   DILATION AND CURETTAGE OF UTERUS  X 4   ESOPHAGOGASTRODUODENOSCOPY     ESOPHAGOGASTRODUODENOSCOPY (EGD) WITH PROPOFOL N/A 06/14/2020   Procedure: ESOPHAGOGASTRODUODENOSCOPY (EGD) WITH PROPOFOL;  Surgeon: Ronnette Juniper, MD;  Location: [facility];  Service: Gastroenterology;  Laterality: N/A;   FLEXIBLE SIGMOIDOSCOPY N/A 06/21/2012   Procedure: FLEXIBLE SIGMOIDOSCOPY;  Surgeon: Jerene Bears, MD;  Location: [facility];  Service: Gastroenterology;  Laterality: N/A;   JOINT REPLACEMENT     LEFT HEART CATH AND CORONARY ANGIOGRAPHY N/A 09/14/2017   Procedure: LEFT HEART CATH AND CORONARY ANGIOGRAPHY;  Surgeon: Belva Crome, MD;  Location: [facility];  Service: Cardiovascular;  Laterality: N/A;   SHOULDER ARTHROSCOPY Right    "shaved spurs off rotator cuff"   TONSILLECTOMY     TOTAL HIP ARTHROPLASTY Right 06/09/2013    Procedure: TOTAL HIP ARTHROPLASTY;  Surgeon: Kerin Salen, MD;  Location: [facility];  Service: Orthopedics;  Laterality: Right;   TOTAL KNEE ARTHROPLASTY Left 02/07/2019   Procedure: TOTAL KNEE ARTHROPLASTY;  Surgeon: Paralee Cancel, MD;  Location: [facility];  Service: Orthopedics;  Laterality: Left;  70 mins   TUBAL LIGATION     TUMOR EXCISION Right 1968   angle of jaw; benign     OB History     Gravida  2   Para  2   Term      Preterm      AB      Living         SAB      IAB      Ectopic      Multiple      Live  Births              Family History  Problem Relation Age of Onset   Drug abuse Mother    Alcohol abuse Mother    Alcohol abuse Father    Hypertension Father    CAD Brother    Hypertension Brother    Alcohol abuse Brother    Hypertension Brother    Hypertension Brother    Psoriasis Daughter    Arthritis Daughter        psoriatic arthritis    Alcohol abuse Grandchild     Social History   Tobacco Use   Smoking status: Never   Smokeless tobacco: Never  Vaping Use   Vaping Use: Never used  Substance Use Topics   Alcohol use: Not Currently    Comment: 09/14/2017 "nothing since 04/15/2008"   Drug use: Not Currently    Frequency: 7.0 times per week    Types: Benzodiazepines    Home Medications Prior to Admission medications   Medication Sig Start Date End Date Taking? Authorizing Provider  acetaminophen (TYLENOL) 325 MG tablet Take 2 tablets (650 mg total) by mouth every 6 (six) hours as needed. 02/11/21   Jeanell Sparrow, DO  ALPRAZolam Duanne Moron) 0.5 MG tablet Take 1 tablet (0.5 mg total) by mouth 4 (four) times daily as needed for anxiety. 02/19/21   [name], PMHNP  atorvastatin (LIPITOR) 40 MG tablet Take 40 mg by mouth every evening.    [provider]  b complex vitamins capsule Take 1 capsule by mouth daily.    [provider]  BD PEN NEEDLE NANO 2ND GEN 32G X 4 MM MISC USE TO INJECT 5 TIMES DAILY AS DIRECTED 09/20/20    Elayne Snare, MD  cholecalciferol (VITAMIN D3) 25 MCG (1000 UNIT) tablet Take 3,000 Units by mouth daily.    [provider]  dicyclomine (BENTYL) 20 MG tablet Take 1 tablet (20 mg total) by mouth 2 (two) times daily as needed for spasms. 02/15/21   Sherwood Gambler, MD  divalproex (DEPAKOTE) 250 MG DR tablet Take 2 tablets (500 mg total) by mouth at bedtime. 01/10/21   [name], PMHNP  donepezil (ARICEPT) 10 MG tablet Take 1 tablet (10 mg total) by mouth at bedtime. 12/03/20   [name], PMHNP  gabapentin (NEURONTIN) 600 MG tablet Take 600 mg by mouth 3 (three) times daily. 05/12/19   [provider]  glucose blood (CONTOUR NEXT TEST) test strip USE TO TEST BLOOD SUGAR TWICE DAILY 06/20/20   Elayne Snare, MD  LANTUS SOLOSTAR 100 UNIT/ML Solostar Pen ADMINISTER 64 UNITS UNDER THE SKIN AT BEDTIME 01/13/21   Elayne Snare, MD  levothyroxine (SYNTHROID) 75 MCG tablet TAKE 1 TABLET BY MOUTH EVERY DAY BEFORE BREAKFAST 12/26/20   Elayne Snare, MD  liothyronine (CYTOMEL) 5 MCG tablet Take 1 tablet (5 mcg total) by mouth daily. Take with levothyroxine before breakfast 12/26/20   Elayne Snare, MD  lithium carbonate (ESKALITH) 450 MG CR tablet Take 1 tablet (450 mg total) by mouth at bedtime. 01/10/21   [name], PMHNP  loperamide (IMODIUM A-D) 2 MG tablet Take 2 mg by mouth 2 (two) times daily as needed for diarrhea or loose stools.    [provider]  losartan (COZAAR) 25 MG tablet Take 25 mg by mouth daily. 08/31/19   [provider]  Melatonin 10 MG TBCR Take 10 mg by mouth at bedtime.    [provider]  memantine (NAMENDA) 10 MG tablet TAKE 1  TABLET(10 MG) BY MOUTH EVERY EVENING 01/10/21   [name], PMHNP  metroNIDAZOLE (FLAGYL) 500 MG tablet Take 1 tablet (500 mg total) by mouth 2 (two) times daily. One po bid x 7 days 02/15/21   Sherwood Gambler, MD  Microlet Lancets MISC TEST DAILY AS DIRECTED 06/20/20   Elayne Snare, MD  nitroGLYCERIN (NITROSTAT)  0.4 MG SL tablet Place 1 tablet (0.4 mg total) under the tongue every 5 (five) minutes as needed for chest pain. 09/14/17   Lyda Jester M, PA-C  OLANZapine (ZYPREXA) 2.5 MG tablet Take 1 tablet (2.5 mg total) by mouth at bedtime. 01/29/21   [name], PMHNP  ondansetron (ZOFRAN) 4 MG tablet Take 1 tablet (4 mg total) by mouth every 8 (eight) hours as needed for nausea or vomiting. 02/15/21   Sherwood Gambler, MD  oxyCODONE (ROXICODONE) 5 MG immediate release tablet Take 1 tablet (5 mg total) by mouth every 6 (six) hours as needed for severe pain. 02/15/21   Sherwood Gambler, MD  Probiotic Product (PROBIOTIC ADVANCED PO) Take by mouth 2 (two) times daily.    [provider]  QUEtiapine (SEROQUEL) 200 MG tablet TAKE 1 TAB PO q 5 pm and 1 tab po QHS 01/10/21   [name], PMHNP  rOPINIRole (REQUIP) 0.5 MG tablet Takes 4 tabs at 5 pm and 3 tabs at bedtime and 1-2 tabs prn 01/10/21   [name], PMHNP  Semaglutide, 2 MG/DOSE, (OZEMPIC, 2 MG/DOSE,) 8 MG/3ML SOPN Inject 2 mg weekly 02/07/21   Elayne Snare, MD  tiZANidine (ZANAFLEX) 2 MG tablet tizanidine 2 mg tablet  TAKE 1 TABLET BY MOUTH THREE TIMES DAILY AS NEEDED FOR MUSCLE PAIN 01/01/21   [provider]    Allergies    Codeine, Procaine hcl, Aspirin, Augmentin [amoxicillin-pot clavulanate], Benadryl [diphenhydramine], Diflucan [fluconazole], Dilaudid [hydromorphone hcl], Hydromorphone, Other, Prednisone, Tramadol hcl, and Sulfa antibiotics  Review of Systems   Review of Systems  All other systems reviewed and are negative.  Physical Exam Updated Vital Signs BP (!) 125/59 (BP Location: Right Arm)    Pulse 94    Temp 98.5 F (36.9 C) (Oral)    Resp 18    Ht 5\' 1"  (1.549 m)    Wt 80.3 kg    SpO2 92%    BMI 33.44 kg/m   Physical Exam Vitals and nursing note reviewed.  75 year old female, resting comfortably and in no acute distress. Vital signs are normal. Oxygen saturation is 92%, which is normal. Head is  normocephalic and atraumatic. PERRLA, EOMI. Oropharynx is clear. Neck is nontender and supple without adenopathy or JVD. Back is mildly tender in the mid and lower lumbar area.  There is moderate bilateral paralumbar spasm.  Straight leg raise is positive on the left at 15 degrees, on the right at 45 degrees.  There is no CVA tenderness. Lungs are clear without rales, wheezes, or rhonchi. Chest is nontender. Heart has regular rate and rhythm without murmur. Abdomen is soft, flat, nontender without masses or hepatosplenomegaly and peristalsis is normoactive. Extremities have 1+ edema, full range of motion is present.  Left calf circumference is 2 cm greater than right calf circumference.  There is no calf tenderness.  Bevelyn Buckles' sign is negative.  Dorsalis pedis pulses are strong, capillary refill is prompt. Skin is warm and dry without rash. Neurologic: Mental status is normal, cranial nerves are intact, strength is 5/5 in all 4 extremities.  There is slight decrease sensation in the left foot presumably secondary to  her known neuropathy.  ED Results / Procedures / Treatments    Radiology US Venous Img Lower Unilateral Left  Result Date: 02/19/2021 CLINICAL DATA:  75 year old female with history of left leg pain for the past 2 days. EXAM: LEFT LOWER EXTREMITY VENOUS DOPPLER ULTRASOUND TECHNIQUE: Gray-scale sonography with compression, as well as color and duplex ultrasound, were performed to evaluate the deep venous system(s) from the level of the common femoral vein through the popliteal and proximal calf veins. COMPARISON:  None. FINDINGS: VENOUS Normal compressibility of the common femoral, superficial femoral, and popliteal veins, as well as the visualized calf veins. Visualized portions of profunda femoral vein and great saphenous vein unremarkable. No filling defects to suggest DVT on grayscale or color Doppler imaging. Doppler waveforms show normal direction of venous flow, normal respiratory  plasticity and response to augmentation. Limited views of the contralateral common femoral vein are unremarkable. OTHER None. Limitations: none IMPRESSION: Negative. Electronically Signed   By: Vinnie Langton M.D.   On: 02/19/2021 06:03    Procedures Procedures   Medications Ordered in ED Medications  ketorolac (TORADOL) 30 MG/ML injection 15 mg (15 mg Intravenous Given 02/19/21 0421)  oxyCODONE (Oxy IR/ROXICODONE) immediate release tablet 10 mg (10 mg Oral Given 02/19/21 0412)  methocarbamol (ROBAXIN) injection 1,000 mg (1,000 mg Intramuscular Given 02/19/21 0415)  ondansetron (ZOFRAN) injection 4 mg (4 mg Intravenous Given 02/19/21 1610)    ED Course  I have reviewed the triage vital signs and the nursing notes.  Pertinent imaging results that were available during my care of the patient were reviewed by me and considered in my medical decision making (see chart for details).   MDM Rules/Calculators/A&P                         Left-sided sciatica.  Left legs swelling which is probably chronic, but need to rule out DVT.  She will be given oral oxycodone, intravenous ketorolac, intravenous methocarbamol and will be sent for venous ultrasound to rule out DVT.  Old records are reviewed, confirming recent ED visit for diverticulitis.  Also, she had lumbar spine MRI 10 years ago showing significant degenerative changes and disc bulges in the lumbar spine.  Today, no evidence of significant neurologic injury.  She had some relief of pain, but not complete relief with above-noted treatment.  Order ultrasound showed no evidence of DVT.  She is discharged with prescription for celecoxib and tizanidine as well as a higher dose of her oxycodone.  Follow-up with PCP.  Final Clinical Impression(s) / ED Diagnoses Final diagnoses:  Left sided sciatica    Rx / DC Orders ED Discharge Orders          Ordered    celecoxib (CELEBREX) 100 MG capsule  2 times daily        02/19/21 0627    tiZANidine  (ZANAFLEX) 4 MG tablet  Every 6 hours PRN        02/19/21 0627    oxyCODONE (ROXICODONE) 5 MG immediate release tablet  Every 4 hours PRN        02/19/21 9604             Delora Fuel, MD 54[birth year] [phone]

## 2021-02-19 NOTE — ED Triage Notes (Signed)
Pt presents to the ED with bilateral leg swelling. Left leg more swollen than the right. Pt states that her legs are always swollen, but states that this is worse than usual. 3+ bilateral pedal pulses noted at time of triage. Pt A&Ox4 and VSS.

## 2021-02-20 DIAGNOSIS — M545 Low back pain, unspecified: Secondary | ICD-10-CM | POA: Diagnosis not present

## 2021-02-24 DIAGNOSIS — M25562 Pain in left knee: Secondary | ICD-10-CM | POA: Diagnosis not present

## 2021-02-25 DIAGNOSIS — M5441 Lumbago with sciatica, right side: Secondary | ICD-10-CM | POA: Diagnosis not present

## 2021-02-28 DIAGNOSIS — R197 Diarrhea, unspecified: Secondary | ICD-10-CM | POA: Diagnosis not present

## 2021-03-04 DIAGNOSIS — K589 Irritable bowel syndrome without diarrhea: Secondary | ICD-10-CM | POA: Diagnosis not present

## 2021-03-04 DIAGNOSIS — K5732 Diverticulitis of large intestine without perforation or abscess without bleeding: Secondary | ICD-10-CM | POA: Diagnosis not present

## 2021-03-04 DIAGNOSIS — A0472 Enterocolitis due to Clostridium difficile, not specified as recurrent: Secondary | ICD-10-CM | POA: Diagnosis not present

## 2021-03-06 DIAGNOSIS — E039 Hypothyroidism, unspecified: Secondary | ICD-10-CM | POA: Diagnosis not present

## 2021-03-06 DIAGNOSIS — N132 Hydronephrosis with renal and ureteral calculous obstruction: Secondary | ICD-10-CM | POA: Diagnosis not present

## 2021-03-06 DIAGNOSIS — K219 Gastro-esophageal reflux disease without esophagitis: Secondary | ICD-10-CM | POA: Diagnosis not present

## 2021-03-06 DIAGNOSIS — E1165 Type 2 diabetes mellitus with hyperglycemia: Secondary | ICD-10-CM | POA: Diagnosis not present

## 2021-03-06 DIAGNOSIS — I10 Essential (primary) hypertension: Secondary | ICD-10-CM | POA: Diagnosis not present

## 2021-03-06 DIAGNOSIS — F319 Bipolar disorder, unspecified: Secondary | ICD-10-CM | POA: Diagnosis not present

## 2021-03-06 DIAGNOSIS — E785 Hyperlipidemia, unspecified: Secondary | ICD-10-CM | POA: Diagnosis not present

## 2021-03-06 DIAGNOSIS — N261 Atrophy of kidney (terminal): Secondary | ICD-10-CM | POA: Diagnosis not present

## 2021-03-06 DIAGNOSIS — G4700 Insomnia, unspecified: Secondary | ICD-10-CM | POA: Diagnosis not present

## 2021-03-10 DIAGNOSIS — Z20822 Contact with and (suspected) exposure to covid-19: Secondary | ICD-10-CM | POA: Diagnosis not present

## 2021-03-11 ENCOUNTER — Ambulatory Visit: Payer: Medicare Other | Admitting: Psychiatry

## 2021-03-12 DIAGNOSIS — M5442 Lumbago with sciatica, left side: Secondary | ICD-10-CM | POA: Diagnosis not present

## 2021-03-12 DIAGNOSIS — M5441 Lumbago with sciatica, right side: Secondary | ICD-10-CM | POA: Diagnosis not present

## 2021-03-17 ENCOUNTER — Telehealth: Payer: Self-pay | Admitting: Endocrinology

## 2021-03-17 DIAGNOSIS — M5441 Lumbago with sciatica, right side: Secondary | ICD-10-CM | POA: Diagnosis not present

## 2021-03-17 DIAGNOSIS — E1165 Type 2 diabetes mellitus with hyperglycemia: Secondary | ICD-10-CM

## 2021-03-17 NOTE — Telephone Encounter (Signed)
Patient requests to be called at ph# [phone] re: Patient states her blood sugars have running in 200's (highest 230-that Patient is sure of).   Also, Patient states that Ozempic is on back order-therefore, Patient has been unable to take Ozempic for several weeks.

## 2021-03-19 MED ORDER — OZEMPIC (1 MG/DOSE) 4 MG/3ML ~~LOC~~ SOPN: 1.0000 mg | PEN_INJECTOR | SUBCUTANEOUS | 0 refills | Status: DC

## 2021-03-19 NOTE — Telephone Encounter (Signed)
1mg  sent and patient notified on dosage

## 2021-03-19 NOTE — Addendum Note (Signed)
Addended by: Cinda Quest on: 03/19/2021 12:05 PM   Modules accepted: Orders

## 2021-03-21 DIAGNOSIS — M5441 Lumbago with sciatica, right side: Secondary | ICD-10-CM | POA: Diagnosis not present

## 2021-03-25 ENCOUNTER — Other Ambulatory Visit: Payer: Self-pay

## 2021-03-25 ENCOUNTER — Other Ambulatory Visit (INDEPENDENT_AMBULATORY_CARE_PROVIDER_SITE_OTHER): Payer: Medicare Other

## 2021-03-25 DIAGNOSIS — Z794 Long term (current) use of insulin: Secondary | ICD-10-CM

## 2021-03-25 DIAGNOSIS — E039 Hypothyroidism, unspecified: Secondary | ICD-10-CM

## 2021-03-25 DIAGNOSIS — E1165 Type 2 diabetes mellitus with hyperglycemia: Secondary | ICD-10-CM | POA: Diagnosis not present

## 2021-03-25 LAB — T3, FREE: T3, Free: 3.2 pg/mL (ref 2.3–4.2)

## 2021-03-25 LAB — T4, FREE: Free T4: 0.93 ng/dL (ref 0.60–1.60)

## 2021-03-25 LAB — BASIC METABOLIC PANEL
BUN: 8 mg/dL (ref 6–23)
CO2: 27 mEq/L (ref 19–32)
Calcium: 10 mg/dL (ref 8.4–10.5)
Chloride: 106 mEq/L (ref 96–112)
Creatinine, Ser: 0.79 mg/dL (ref 0.40–1.20)
GFR: 74.33 mL/min (ref >60.00)
Glucose, Bld: 143 mg/dL — ABNORMAL HIGH (ref 70–99)
Potassium: 4.2 mEq/L (ref 3.5–5.1)
Sodium: 140 mEq/L (ref 135–145)

## 2021-03-25 LAB — TSH: TSH: 1.04 u[IU]/mL (ref 0.35–5.50)

## 2021-03-25 LAB — HEMOGLOBIN A1C: Hgb A1c MFr Bld: 7.7 % — ABNORMAL HIGH (ref 4.6–6.5)

## 2021-03-26 ENCOUNTER — Other Ambulatory Visit: Payer: Self-pay | Admitting: Endocrinology

## 2021-03-28 ENCOUNTER — Encounter: Payer: Self-pay | Admitting: Endocrinology

## 2021-03-28 ENCOUNTER — Other Ambulatory Visit: Payer: Self-pay

## 2021-03-28 ENCOUNTER — Ambulatory Visit (INDEPENDENT_AMBULATORY_CARE_PROVIDER_SITE_OTHER): Payer: Medicare Other | Admitting: Endocrinology

## 2021-03-28 VITALS — BP 132/84 | HR 71 | Ht 64.0 in | Wt 181.0 lb

## 2021-03-28 DIAGNOSIS — E039 Hypothyroidism, unspecified: Secondary | ICD-10-CM

## 2021-03-28 DIAGNOSIS — E1165 Type 2 diabetes mellitus with hyperglycemia: Secondary | ICD-10-CM | POA: Diagnosis not present

## 2021-03-28 DIAGNOSIS — Z794 Long term (current) use of insulin: Secondary | ICD-10-CM

## 2021-03-28 NOTE — Patient Instructions (Addendum)
Check blood sugars on waking up 3 days a week  Also check blood sugars about 2 hours after meals and do this after different meals by rotation  Recommended blood sugar levels on waking up are 90-130 and about 2 hours after meal is 130-160  Please bring your blood sugar monitor to each visit, thank you   Do 2 full turns of pen for 1st 2 shots of Ozempic then 1mg  weekly

## 2021-03-28 NOTE — Progress Notes (Signed)
Patient ID: [name] [name], female   DOB: 18-Sep-1945, 76 y.o.   MRN: [id number]            Reason for Appointment: Follow-up    History of Present Illness:          Date of diagnosis of type 2 diabetes mellitus: ?  2014        Background history:   She thinks her blood sugar was 400 at the time of diagnosis but no detailed records of this are available She did have an A1c of 10.6 done in 2014 and was probably given Lantus for some time initially Apparently she was treated with various medications including metformin, Janumet and Tradgenta. Her blood sugars had improved and A1c in 2015 was down to 6.2 She tends to have diarrhea with metformin and Janumet and also she thinks it causes dry mouth Most likely Janumet was stopped in 06/2015 Glipizide was started in 8/17 when blood sugars were higher and A1c was 9%  Recent history:   INSULIN regimen is:   Lantus 64 units daily at 5 pm     Non-insulin hypoglycemic drugs the patient is taking are: 1 mg Ozempic weekly, off currently  Her A1c is 7.7, was 6.8   Current management, blood sugar patterns and problems identified:  She has had much higher blood sugars because of being off Ozempic for at least a couple of months  This was not available at the pharmacy She also apparently did not request a replacement  She only got her replacement for Ozempic this week but has not started it yet Also she has been hospitalized a couple of times in December for various reasons  Her weight has gradually gone up  Most of her hyperglycemia now appears to be after meals However checking blood sugars very infrequently  She has not gone up on her insulin despite higher blood sugars  She also says because of stress and other issues she is still eating some snacks and sweets at night  Blood sugars may be near normal before dinnertime However overall frequency of monitoring is inadequate at all times  to be not checking her blood sugars before dinner  instead of at various times  Previously had been recommended mealtime Humalog but she would not take it  Side effects from medications have been:?  Diarrhea from metformin and Janumet  Glucose monitoring:  done 1 time a day or less        Glucometer: Contour        Blood Glucose readings    PRE-MEAL Fasting Lunch Dinner Bedtime Overall  Glucose range: 148, 149  116-230    Mean/median:     181   POST-MEAL PC Breakfast PC Lunch PC Dinner  Glucose range: 182  171, 236  Mean/median:      Previously:  PRE-MEAL Fasting Lunch Dinner Bedtime Overall  Glucose range: 164  116-179    Mean/median:   145     POST-MEAL PC Breakfast PC Lunch PC Dinner  Glucose range:  164, 203   Mean/median:        Self-care: The diet that the patient has been following RK:YHCW, tries to limit drinks with sugar .      Typical meal intake: Breakfast at 9,  May be eggs and sausage and lunch is usually a sandwich   Dinner 7 pm               Dietician visit, most recent: 10/18  Weight history:   Wt Readings from Last 3 Encounters:  03/28/21 181 lb (82.1 kg)  02/19/21 177 lb (80.3 kg)  02/15/21 177 lb (80.3 kg)    Glycemic control:   Lab Results  Component Value Date   HGBA1C 7.7 (H) 03/25/2021   HGBA1C 6.8 (H) 12/24/2020   HGBA1C 6.6 (H) 10/08/2020   Lab Results  Component Value Date   MICROALBUR <0.7 08/09/2020   LDLCALC 79 08/09/2020   CREATININE 0.79 03/25/2021   Lab Results  Component Value Date   MICRALBCREAT 1.1 08/09/2020       Allergies as of 03/28/2021       Reactions   Codeine Anxiety, Other (See Comments)   Hallucinations, tolerates oxycodone    Procaine Hcl Palpitations   Aspirin Nausea And Vomiting, Other (See Comments)   Reaction:  Burns pts stomach    Augmentin [amoxicillin-pot Clavulanate] Diarrhea   Benadryl [diphenhydramine] Other (See Comments)   Per MD "inhibits potency of gabapentin, lithium etc"   Diflucan [fluconazole] Other (See  Comments)   Unknown reaction   Dilaudid [hydromorphone Hcl] Other (See Comments)   Migraines and nightmares    Hydromorphone Other (See Comments)   Other Nausea And Vomiting, Other (See Comments)   Pt states that all -mycins cause N/V. (MACROLIDES)    Prednisone    Long term steroid problems with lithium and blood sugar.    Tramadol Hcl    Other reaction(s): felt weird   Sulfa Antibiotics Other (See Comments)   Unknown reaction        Medication List        Accurate as of March 28, 2021  3:19 PM. If you have any questions, ask your nurse or doctor.          acetaminophen 325 MG tablet Commonly known as: Tylenol Take 2 tablets (650 mg total) by mouth every 6 (six) hours as needed.   ALPRAZolam 0.5 MG tablet Commonly known as: XANAX Take 1 tablet (0.5 mg total) by mouth 4 (four) times daily as needed for anxiety.   atorvastatin 40 MG tablet Commonly known as: LIPITOR Take 40 mg by mouth every evening.   b complex vitamins capsule Take 1 capsule by mouth daily.   BD Pen Needle Nano 2nd Gen 32G X 4 MM Misc Generic drug: Insulin Pen Needle USE TO INJECT 5 TIMES DAILY AS DIRECTED   celecoxib 100 MG capsule Commonly known as: CeleBREX Take 1 capsule (100 mg total) by mouth 2 (two) times daily.   cholecalciferol 25 MCG (1000 UNIT) tablet Commonly known as: VITAMIN D3 Take 3,000 Units by mouth daily.   Contour Next Test test strip Generic drug: glucose blood USE TO TEST BLOOD SUGAR TWICE DAILY   dicyclomine 20 MG tablet Commonly known as: BENTYL Take 1 tablet (20 mg total) by mouth 2 (two) times daily as needed for spasms.   divalproex 250 MG DR tablet Commonly known as: DEPAKOTE Take 2 tablets (500 mg total) by mouth at bedtime.   donepezil 10 MG tablet Commonly known as: ARICEPT Take 1 tablet (10 mg total) by mouth at bedtime.   gabapentin 600 MG tablet Commonly known as: NEURONTIN Take 600 mg by mouth 3 (three) times daily.   Lantus SoloStar 100  UNIT/ML Solostar Pen Generic drug: insulin glargine ADMINISTER 64 UNITS UNDER THE SKIN AT BEDTIME   levothyroxine 75 MCG tablet Commonly known as: SYNTHROID TAKE 1 TABLET BY MOUTH EVERY DAY BEFORE BREAKFAST   liothyronine 5 MCG tablet Commonly known  as: CYTOMEL TAKE 1 TABLET BY MOUTH DAILY WITH LEVOTHYROXINE. TAKE BEFORE BREAKFAST   lithium carbonate 450 MG CR tablet Commonly known as: ESKALITH Take 1 tablet (450 mg total) by mouth at bedtime.   loperamide 2 MG tablet Commonly known as: IMODIUM A-D Take 2 mg by mouth 2 (two) times daily as needed for diarrhea or loose stools.   losartan 25 MG tablet Commonly known as: COZAAR Take 25 mg by mouth daily.   Melatonin 10 MG Tbcr Take 10 mg by mouth at bedtime.   memantine 10 MG tablet Commonly known as: NAMENDA TAKE 1 TABLET(10 MG) BY MOUTH EVERY EVENING   metroNIDAZOLE 500 MG tablet Commonly known as: Flagyl Take 1 tablet (500 mg total) by mouth 2 (two) times daily. One po bid x 7 days   Microlet Lancets Misc TEST DAILY AS DIRECTED   nitroGLYCERIN 0.4 MG SL tablet Commonly known as: NITROSTAT Place 1 tablet (0.4 mg total) under the tongue every 5 (five) minutes as needed for chest pain.   OLANZapine 2.5 MG tablet Commonly known as: ZYPREXA Take 1 tablet (2.5 mg total) by mouth at bedtime.   ondansetron 4 MG tablet Commonly known as: ZOFRAN Take 1 tablet (4 mg total) by mouth every 8 (eight) hours as needed for nausea or vomiting.   oxyCODONE 5 MG immediate release tablet Commonly known as: Roxicodone Take 1 tablet (5 mg total) by mouth every 6 (six) hours as needed for severe pain.   oxyCODONE 5 MG immediate release tablet Commonly known as: Roxicodone Take 1-2 tablets (5-10 mg total) by mouth every 4 (four) hours as needed for severe pain.   Ozempic (2 MG/DOSE) 8 MG/3ML Sopn Generic drug: Semaglutide (2 MG/DOSE) Inject 2 mg weekly   Ozempic (1 MG/DOSE) 4 MG/3ML Sopn Generic drug: Semaglutide (1  MG/DOSE) Inject 1 mg into the skin once a week.   PROBIOTIC ADVANCED PO Take by mouth 2 (two) times daily.   QUEtiapine 200 MG tablet Commonly known as: SEROQUEL TAKE 1 TAB PO q 5 pm and 1 tab po QHS   rOPINIRole 0.5 MG tablet Commonly known as: REQUIP Takes 4 tabs at 5 pm and 3 tabs at bedtime and 1-2 tabs prn   tiZANidine 4 MG tablet Commonly known as: Zanaflex Take 1 tablet (4 mg total) by mouth every 6 (six) hours as needed for muscle spasms.        Allergies:  Allergies  Allergen Reactions   Codeine Anxiety and Other (See Comments)    Hallucinations, tolerates oxycodone    Procaine Hcl Palpitations   Aspirin Nausea And Vomiting and Other (See Comments)    Reaction:  Burns pts stomach    Augmentin [Amoxicillin-Pot Clavulanate] Diarrhea   Benadryl [Diphenhydramine] Other (See Comments)    Per MD "inhibits potency of gabapentin, lithium etc"   Diflucan [Fluconazole] Other (See Comments)    Unknown reaction    Dilaudid [Hydromorphone Hcl] Other (See Comments)    Migraines and nightmares    Hydromorphone Other (See Comments)   Other Nausea And Vomiting and Other (See Comments)    Pt states that all -mycins cause N/V. (MACROLIDES)    Prednisone     Long term steroid problems with lithium and blood sugar.    Tramadol Hcl     Other reaction(s): felt weird   Sulfa Antibiotics Other (See Comments)    Unknown reaction    Past Medical History:  Diagnosis Date   Alcohol abuse    Anxiety  takes Valium daily as needed and Ativan daily   Bilateral hearing loss    Bipolar 1 disorder ([place])    takes Lithium nightly and Synthroid daily   Chronic back pain    DDD; "all over" (09/14/2017)   Colitis, ischemic ([place]) 2012   Confusion    r/t meds   Depression    takes Prozac daily and Bupspirone    Diverticulosis    Dyslipidemia    takes Crestor daily   Fibromyalgia    Gastroparesis    Headache    "weekly" (09/14/2017)   Hepatic steatosis 06/18/12   severe    Hyperlipidemia    Hypertension    Hypothyroidism    IBS (irritable bowel syndrome)    Ischemic colitis ([place])    Joint pain    Joint swelling    Lupus erythematosus tumidus    tumid-skin   Migraine    "1-2/yr; maybe" (09/14/2017)   Numbness    in right foot   Osteoarthritis    "all over" (09/14/2017)   Osteoarthritis cervical spine    Osteoarthritis of hand    bilateral   Pneumonia    "walking pneumonia several times; long time since the last time" (09/14/2017)   Restless leg syndrome    takes Requip nightly   Sciatica    Type II diabetes mellitus ([place])    Urinary frequency    Urinary leakage    Urinary urgency    Urinary, incontinence, stress female    Walking pneumonia    last time more than 45yrs ago    Past Surgical History:  Procedure Laterality Date   ABDOMINAL HYSTERECTOMY     "they left my ovaries"   APPENDECTOMY     BALLOON DILATION N/A 06/14/2020   Procedure: BALLOON DILATION;  Surgeon: Ronnette Juniper, MD;  Location: [facility];  Service: Gastroenterology;  Laterality: N/A;   BIOPSY  06/14/2020   Procedure: BIOPSY;  Surgeon: Ronnette Juniper, MD;  Location: [facility];  Service: Gastroenterology;;   CARDIAC CATHETERIZATION  09/14/2017   COLONOSCOPY     DENTAL SURGERY Left 10/2016   dental implant   DILATION AND CURETTAGE OF UTERUS  X 4   ESOPHAGOGASTRODUODENOSCOPY     ESOPHAGOGASTRODUODENOSCOPY (EGD) WITH PROPOFOL N/A 06/14/2020   Procedure: ESOPHAGOGASTRODUODENOSCOPY (EGD) WITH PROPOFOL;  Surgeon: Ronnette Juniper, MD;  Location: [facility];  Service: Gastroenterology;  Laterality: N/A;   FLEXIBLE SIGMOIDOSCOPY N/A 06/21/2012   Procedure: FLEXIBLE SIGMOIDOSCOPY;  Surgeon: Jerene Bears, MD;  Location: [facility];  Service: Gastroenterology;  Laterality: N/A;   JOINT REPLACEMENT     LEFT HEART CATH AND CORONARY ANGIOGRAPHY N/A 09/14/2017   Procedure: LEFT HEART CATH AND CORONARY ANGIOGRAPHY;  Surgeon: Belva Crome, MD;  Location: [facility];  Service:  Cardiovascular;  Laterality: N/A;   SHOULDER ARTHROSCOPY Right    "shaved spurs off rotator cuff"   TONSILLECTOMY     TOTAL HIP ARTHROPLASTY Right 06/09/2013   Procedure: TOTAL HIP ARTHROPLASTY;  Surgeon: Kerin Salen, MD;  Location: [facility];  Service: Orthopedics;  Laterality: Right;   TOTAL KNEE ARTHROPLASTY Left 02/07/2019   Procedure: TOTAL KNEE ARTHROPLASTY;  Surgeon: Paralee Cancel, MD;  Location: [facility];  Service: Orthopedics;  Laterality: Left;  70 mins   TUBAL LIGATION     TUMOR EXCISION Right 1968   angle of jaw; benign    Family History  Problem Relation Age of Onset   Drug abuse Mother    Alcohol abuse Mother    Alcohol abuse  Father    Hypertension Father    CAD Brother    Hypertension Brother    Alcohol abuse Brother    Hypertension Brother    Hypertension Brother    Psoriasis Daughter    Arthritis Daughter        psoriatic arthritis    Alcohol abuse Grandchild     Social History:  reports that she has never smoked. She has never used smokeless tobacco. She reports that she does not currently use alcohol. She reports that she does not currently use drugs after having used the following drugs: Benzodiazepines. Frequency: 7.00 times per week.   Review of Systems    Lipid history: On Lipitor 40 mg with usually good control of LDL  Triglycerides last below 200  Taking OTC fish oil    Lab Results  Component Value Date   CHOL 172 08/09/2020   HDL 56.90 08/09/2020   LDLCALC 79 08/09/2020   LDLDIRECT 137.0 12/04/2019   TRIG 180.0 (H) 08/09/2020   CHOLHDL 3 08/09/2020           No recent issues with high blood pressure, currently only on 25 mg losartan  BP Readings from Last 3 Encounters:  03/28/21 132/84  02/19/21 129/65  02/15/21 (!) 105/53     Most recent eye exam was In 03/2018, negative  Most recent foot exam: 6/21  THYROID: She previously had been on levothyroxine and Cytomel from her psychiatrist for several years with uncertain diagnosis She is  taking levothyroxine 75 g, Also on liothyronine 5 mcg, previously T3 level was low when this was stopped  She continues to have some fatigue and weakness  Also on lithium  Her TSH is consistently normal Free T3 and free T4 again normal  Labs as follows:  Lab Results  Component Value Date   TSH 1.04 03/25/2021   TSH 1.27 12/24/2020   TSH 1.488 10/08/2020   FREET4 0.93 03/25/2021   FREET4 0.90 12/24/2020   FREET4 0.90 08/28/2019   Lab Results  Component Value Date   T3FREE 3.2 03/25/2021   T3FREE 2.2 (L) 12/24/2020   T3FREE 3.2 08/28/2019   T3FREE 2.7 04/26/2019   T3FREE 2.5 07/04/2018    She has atypical depression on multiple drugs, has been on Seroquel for quite some time  THYROID nodule:  She was found to have a nodule in her thyroid isthmus in 8/20 Ultrasound done in 1/21 showed multinodular goiter with only 1 significant nodule   However nodule in the isthmus was biopsied on the recommendation of her PCP in 3/22 and that showed benign follicular nodule  Previously had mild hypercalcemia, last levels:  Lab Results  Component Value Date   PTH 35 10/25/2018   CALCIUM 10.0 03/25/2021   CAION 1.30 07/14/2016   PHOS 2.9 12/26/2010   She is asking about feeling weak for the last few days, no other symptoms  She is on Namenda and Aricept for dementia  Physical Examination:  BP 132/84    Pulse 71    Ht 5\' 4"  (1.626 m)    Wt 181 lb (82.1 kg)    SpO2 92%    BMI 31.07 kg/m         ASSESSMENT:  Diabetes type 2, on insulin  See history of present illness for detailed discussion of current diabetes management, blood sugar patterns, monitor download and problems identified  She is on 64 units Lantus insulin, previously also on Ozempic 1 mg weekly  Her A1c is significantly higher at  7.7 compared to 6.8 and gradually increasing   She had been on 1 mg Ozempic which she has not been able to get for at least a couple of months causing hyperglycemia Also likely  not watching her diet and getting more carbohydrate and sweets without this Currently only on basal insulin Previously intolerant to metformin  She has a few blood sugars at home and these are mostly high after meals or snacks with fasting readings in the 140s Has difficulty exercising   Probable secondary hypothyroidism: TSH normal  She has done fairly well with stable doses of liothyronine 5 mcg and levothyroxine 75 mcg  HYPERTENSION: Mild and well controlled  Generalized weakness: Stable need to discuss with PCP as she has no evidence of decreased blood pressure, renal dysfunction or electrolyte imbalance  PLAN:   She will be resume her Ozempic 1 mg starting tomorrow However since she has not taken any for several weeks she will only dial up 2 turns of the pen for the first 2 shots and this was demonstrated in the office Subsequently can go back to 1 mg weekly We also consider 2 mg on next visit  Encouraged her to check blood sugars more regularly at various times She will try to cut back on snacks and sweets Again no change in Lantus as yet She will need to call if she has consistently high readings especially after meals and may consider Humalog again  Continue liothyronine 5 mcg daily in addition to Synthroid 75  Follow-up in 2  months   Patient Instructions  Check blood sugars on waking up 3 days a week  Also check blood sugars about 2 hours after meals and do this after different meals by rotation  Recommended blood sugar levels on waking up are 90-130 and about 2 hours after meal is 130-160  Please bring your blood sugar monitor to each visit, thank you   Do 2 full turns of pen for 1st 2 shots of Ozempic then 1mg  weekly         Elayne Snare 03/28/2021, 3:19 PM   Note: This office note was prepared with Dragon voice recognition system technology. Any transcriptional errors that result from this process are unintentional.

## 2021-03-31 ENCOUNTER — Other Ambulatory Visit: Payer: Self-pay

## 2021-03-31 ENCOUNTER — Encounter (HOSPITAL_BASED_OUTPATIENT_CLINIC_OR_DEPARTMENT_OTHER): Payer: Self-pay

## 2021-03-31 ENCOUNTER — Emergency Department (HOSPITAL_BASED_OUTPATIENT_CLINIC_OR_DEPARTMENT_OTHER): Payer: Medicare Other

## 2021-03-31 ENCOUNTER — Emergency Department (HOSPITAL_BASED_OUTPATIENT_CLINIC_OR_DEPARTMENT_OTHER)
Admission: EM | Admit: 2021-03-31 | Discharge: 2021-03-31 | Disposition: A | Discharge status: Home / Self Care | Payer: Medicare Other | Attending: Emergency Medicine | Admitting: Emergency Medicine

## 2021-03-31 DIAGNOSIS — E119 Type 2 diabetes mellitus without complications: Secondary | ICD-10-CM | POA: Diagnosis not present

## 2021-03-31 DIAGNOSIS — I700 Atherosclerosis of aorta: Secondary | ICD-10-CM | POA: Diagnosis not present

## 2021-03-31 DIAGNOSIS — K5792 Diverticulitis of intestine, part unspecified, without perforation or abscess without bleeding: Secondary | ICD-10-CM | POA: Diagnosis not present

## 2021-03-31 DIAGNOSIS — E039 Hypothyroidism, unspecified: Secondary | ICD-10-CM | POA: Insufficient documentation

## 2021-03-31 DIAGNOSIS — R1031 Right lower quadrant pain: Secondary | ICD-10-CM | POA: Diagnosis not present

## 2021-03-31 DIAGNOSIS — R109 Unspecified abdominal pain: Secondary | ICD-10-CM | POA: Diagnosis not present

## 2021-03-31 DIAGNOSIS — I10 Essential (primary) hypertension: Secondary | ICD-10-CM | POA: Insufficient documentation

## 2021-03-31 DIAGNOSIS — M5441 Lumbago with sciatica, right side: Secondary | ICD-10-CM | POA: Diagnosis not present

## 2021-03-31 DIAGNOSIS — R1032 Left lower quadrant pain: Secondary | ICD-10-CM | POA: Diagnosis not present

## 2021-03-31 DIAGNOSIS — Z96652 Presence of left artificial knee joint: Secondary | ICD-10-CM | POA: Diagnosis not present

## 2021-03-31 DIAGNOSIS — R1030 Lower abdominal pain, unspecified: Secondary | ICD-10-CM | POA: Diagnosis not present

## 2021-03-31 DIAGNOSIS — Z96641 Presence of right artificial hip joint: Secondary | ICD-10-CM | POA: Insufficient documentation

## 2021-03-31 DIAGNOSIS — Z79899 Other long term (current) drug therapy: Secondary | ICD-10-CM | POA: Insufficient documentation

## 2021-03-31 LAB — COMPREHENSIVE METABOLIC PANEL
ALT: 9 U/L (ref 0–44)
AST: 11 U/L — ABNORMAL LOW (ref 15–41)
Albumin: 4 g/dL (ref 3.5–5.0)
Alkaline Phosphatase: 85 U/L (ref 38–126)
Anion gap: 10 (ref 5–15)
BUN: 11 mg/dL (ref 8–23)
CO2: 25 mmol/L (ref 22–32)
Calcium: 10.1 mg/dL (ref 8.9–10.3)
Chloride: 101 mmol/L (ref 98–111)
Creatinine, Ser: 0.85 mg/dL (ref 0.44–1.00)
GFR, Estimated: >60 mL/min (ref >60)
Glucose, Bld: 212 mg/dL — ABNORMAL HIGH (ref 70–99)
Potassium: 4.7 mmol/L (ref 3.5–5.1)
Sodium: 136 mmol/L (ref 135–145)
Total Bilirubin: 0.5 mg/dL (ref 0.3–1.2)
Total Protein: 6.9 g/dL (ref 6.5–8.1)

## 2021-03-31 LAB — URINALYSIS, ROUTINE W REFLEX MICROSCOPIC
Bilirubin Urine: NEGATIVE
Glucose, UA: NEGATIVE mg/dL
Hgb urine dipstick: NEGATIVE
Ketones, ur: NEGATIVE mg/dL
Leukocytes,Ua: NEGATIVE
Nitrite: NEGATIVE
Protein, ur: NEGATIVE mg/dL
Specific Gravity, Urine: 1.034 — ABNORMAL HIGH (ref 1.005–1.030)
pH: 6.5 (ref 5.0–8.0)

## 2021-03-31 LAB — CBC
HCT: 42.7 % (ref 36.0–46.0)
Hemoglobin: 13.6 g/dL (ref 12.0–15.0)
MCH: 28.9 pg (ref 26.0–34.0)
MCHC: 31.9 g/dL (ref 30.0–36.0)
MCV: 90.7 fL (ref 80.0–100.0)
Platelets: 224 10*3/uL (ref 150–400)
RBC: 4.71 MIL/uL (ref 3.87–5.11)
RDW: 13.4 % (ref 11.5–15.5)
WBC: 10.6 10*3/uL — ABNORMAL HIGH (ref 4.0–10.5)
nRBC: 0 % (ref 0.0–0.2)

## 2021-03-31 LAB — LIPASE, BLOOD: Lipase: 13 U/L (ref 11–51)

## 2021-03-31 MED ORDER — SUCRALFATE 1 G PO TABS: 1.0000 g | ORAL_TABLET | Freq: Three times a day (TID) | ORAL | 0 refills | Status: DC

## 2021-03-31 MED ORDER — IOHEXOL 300 MG/ML  SOLN
85.0000 mL | Freq: Once | INTRAMUSCULAR | Status: AC | PRN
  Administered 2021-03-31: 85 mL via INTRAVENOUS

## 2021-03-31 MED ORDER — CEFUROXIME AXETIL 250 MG PO TABS: 250.0000 mg | ORAL_TABLET | Freq: Two times a day (BID) | ORAL | 0 refills | Status: DC

## 2021-03-31 MED ORDER — OXYCODONE-ACETAMINOPHEN 5-325 MG PO TABS: 1.0000 | ORAL_TABLET | Freq: Four times a day (QID) | ORAL | 0 refills | Status: DC | PRN

## 2021-03-31 MED ORDER — ONDANSETRON HCL 4 MG/2ML IJ SOLN
4.0000 mg | Freq: Once | INTRAMUSCULAR | Status: AC
Start: 2021-03-31 — End: 2021-03-31
  Administered 2021-03-31: 4 mg via INTRAVENOUS
  Filled 2021-03-31: qty 2

## 2021-03-31 MED ORDER — METRONIDAZOLE 500 MG PO TABS: 500.0000 mg | ORAL_TABLET | Freq: Two times a day (BID) | ORAL | 0 refills | Status: DC

## 2021-03-31 MED ORDER — SODIUM CHLORIDE 0.9 % IV BOLUS
1000.0000 mL | Freq: Once | INTRAVENOUS | Status: AC
  Administered 2021-03-31: 1000 mL via INTRAVENOUS

## 2021-03-31 MED ORDER — MORPHINE SULFATE (PF) 4 MG/ML IV SOLN
4.0000 mg | Freq: Once | INTRAVENOUS | Status: AC
  Administered 2021-03-31: 4 mg via INTRAVENOUS
  Filled 2021-03-31: qty 1

## 2021-03-31 NOTE — Discharge Instructions (Addendum)
It was a pleasure caring for you today in the emergency department. ° °Please return to the emergency department for any worsening or worrisome symptoms. ° ° °

## 2021-03-31 NOTE — ED Provider Notes (Signed)
Patient signed to me by Dr. Pearline Cables pending results of abdominal CT which shows diverticulitis.  Patiently placed on Flagyl and Ceftin as she states this works for her.  Will prescribe oxycodone for pain and she will follow-up with her GI doctor.   [name] Leigh, MD 03/31/21 1704

## 2021-03-31 NOTE — ED Provider Notes (Signed)
[facility] EMERGENCY DEPT Provider Note   CSN: [id number] Arrival date & time: 03/31/21  1235     History  No chief complaint on file.   [name] [name] is a 76 y.o. female.  This is a 76 y.o. female with significant medical history as below, including anxiety, hypertension, hyperlipidemia, IBS who presents to the ED with complaint of abdominal pain.  Patient feels as though her "diverticulitis is flaring up."  Onset of symptoms in the last 24 hours.  Described as cramping, squeezing sensation to her bilateral lower quadrants, left greater than right.  Not associate with nausea or vomiting.  No fevers or chills.  She feels as though the pain is worsened when she defecates.  No rectal pain.  No melena or bright red blood per rectum.  No change urination.  No fevers or chills.  No recent changes to diet or daily medications.  No recent travel or sick contacts.  Not taking medications at home for her symptoms.  No alleviating factors described.  Patient reports that it feels similar to when she has been diagnosed with diverticulitis in the past    Past Medical History: No date: Alcohol abuse No date: Anxiety     Comment:  takes Valium daily as needed and Ativan daily No date: Bilateral hearing loss No date: Bipolar 1 disorder ([name])     Comment:  takes Lithium nightly and Synthroid daily No date: Chronic back pain     Comment:  DDD; "all over" (09/14/2017) 2012: Colitis, ischemic ([place]) No date: Confusion     Comment:  r/t meds No date: Depression     Comment:  takes Prozac daily and Bupspirone  No date: Diverticulosis No date: Dyslipidemia     Comment:  takes Crestor daily No date: Fibromyalgia No date: Gastroparesis No date: Headache     Comment:  "weekly" (09/14/2017) 06/18/12: Hepatic steatosis     Comment:  severe No date: Hyperlipidemia No date: Hypertension No date: Hypothyroidism No date: IBS (irritable bowel syndrome) No date: Ischemic colitis ([place]) No  date: Joint pain No date: Joint swelling No date: Lupus erythematosus tumidus     Comment:  tumid-skin No date: Migraine     Comment:  "1-2/yr; maybe" (09/14/2017) No date: Numbness     Comment:  in right foot No date: Osteoarthritis     Comment:  "all over" (09/14/2017) No date: Osteoarthritis cervical spine No date: Osteoarthritis of hand     Comment:  bilateral No date: Pneumonia     Comment:  "walking pneumonia several times; long time since the               last time" (09/14/2017) No date: Restless leg syndrome     Comment:  takes Requip nightly No date: Sciatica No date: Type II diabetes mellitus ([name]) No date: Urinary frequency No date: Urinary leakage No date: Urinary urgency No date: Urinary, incontinence, stress female No date: Walking pneumonia     Comment:  last time more than 48yrs ago  Past Surgical History: No date: ABDOMINAL HYSTERECTOMY     Comment:  "they left my ovaries" No date: APPENDECTOMY 06/14/2020: BALLOON DILATION; N/A     Comment:  Procedure: BALLOON DILATION;  Surgeon: Ronnette Juniper, MD;               Location: [facility];  Service: Gastroenterology;                Laterality: N/A; 06/14/2020: BIOPSY     Comment:  Procedure: BIOPSY;  Surgeon: Ronnette Juniper, MD;  Location:              [facility];  Service: Gastroenterology;; 09/14/2017: CARDIAC CATHETERIZATION No date: COLONOSCOPY 10/2016: DENTAL SURGERY; Left     Comment:  dental implant X 4: DILATION AND CURETTAGE OF UTERUS No date: ESOPHAGOGASTRODUODENOSCOPY 06/14/2020: ESOPHAGOGASTRODUODENOSCOPY (EGD) WITH PROPOFOL; N/A     Comment:  Procedure: ESOPHAGOGASTRODUODENOSCOPY (EGD) WITH               PROPOFOL;  Surgeon: Ronnette Juniper, MD;  Location: [facility]               ENDOS[facility];  Service: Gastroenterology;  Laterality: N/A; 06/21/2012: FLEXIBLE SIGMOIDOSCOPY; N/A     Comment:  Procedure: FLEXIBLE SIGMOIDOSCOPY;  Surgeon: Jerene Bears, MD;  Location: [facility];  Service:                Gastroenterology;  Laterality: N/A; No date: JOINT REPLACEMENT 09/14/2017: LEFT HEART CATH AND CORONARY ANGIOGRAPHY; N/A     Comment:  Procedure: LEFT HEART CATH AND CORONARY ANGIOGRAPHY;                Surgeon: Belva Crome, MD;  Location: [facility]               [facility];  Service: Cardiovascular;  Laterality: N/A; No date: SHOULDER ARTHROSCOPY; Right     Comment:  "shaved spurs off rotator cuff" No date: TONSILLECTOMY 06/09/2013: TOTAL HIP ARTHROPLASTY; Right     Comment:  Procedure: TOTAL HIP ARTHROPLASTY;  Surgeon: Kerin Salen, MD;  Location: [facility];  Service: Orthopedics;                Laterality: Right; 02/07/2019: TOTAL KNEE ARTHROPLASTY; Left     Comment:  Procedure: TOTAL KNEE ARTHROPLASTY;  Surgeon: Paralee Cancel, MD;  Location: [facility];  Service: Orthopedics;                Laterality: Left;  70 mins No date: TUBAL LIGATION 1968: TUMOR EXCISION; Right     Comment:  angle of jaw; benign    The history is provided by the patient. No language interpreter was used.      Home Medications Prior to Admission medications   Medication Sig Start Date End Date Taking? Authorizing Provider  acetaminophen (TYLENOL) 325 MG tablet Take 2 tablets (650 mg total) by mouth every 6 (six) hours as needed. 02/11/21   Jeanell Sparrow, DO  ALPRAZolam Duanne Moron) 0.5 MG tablet Take 1 tablet (0.5 mg total) by mouth 4 (four) times daily as needed for anxiety. 02/19/21   [name], PMHNP  atorvastatin (LIPITOR) 40 MG tablet Take 40 mg by mouth every evening.    [provider]  b complex vitamins capsule Take 1 capsule by mouth daily.    [provider]  BD PEN NEEDLE NANO 2ND GEN 32G X 4 MM MISC USE TO INJECT 5 TIMES DAILY AS DIRECTED 09/20/20   Elayne Snare, MD  celecoxib (CELEBREX) 100 MG capsule Take 1 capsule (100 mg total) by mouth 2 (two) times daily. Patient not taking: Reported on 0/12/2723 36/64/40   Delora Fuel, MD  cholecalciferol (VITAMIN  D3) 25 MCG (1000 UNIT) tablet Take 3,000 Units by mouth  daily.    [provider]  dicyclomine (BENTYL) 20 MG tablet Take 1 tablet (20 mg total) by mouth 2 (two) times daily as needed for spasms. 02/15/21   Sherwood Gambler, MD  divalproex (DEPAKOTE) 250 MG DR tablet Take 2 tablets (500 mg total) by mouth at bedtime. 01/10/21   [name], PMHNP  donepezil (ARICEPT) 10 MG tablet Take 1 tablet (10 mg total) by mouth at bedtime. 12/03/20   [name], PMHNP  gabapentin (NEURONTIN) 600 MG tablet Take 600 mg by mouth 3 (three) times daily. 05/12/19   [provider]  glucose blood (CONTOUR NEXT TEST) test strip USE TO TEST BLOOD SUGAR TWICE DAILY 06/20/20   Elayne Snare, MD  LANTUS SOLOSTAR 100 UNIT/ML Solostar Pen ADMINISTER 64 UNITS UNDER THE SKIN AT BEDTIME 01/13/21   Elayne Snare, MD  levothyroxine (SYNTHROID) 75 MCG tablet TAKE 1 TABLET BY MOUTH EVERY DAY BEFORE BREAKFAST 03/27/21   Elayne Snare, MD  liothyronine (CYTOMEL) 5 MCG tablet TAKE 1 TABLET BY MOUTH DAILY WITH LEVOTHYROXINE. TAKE BEFORE BREAKFAST 03/27/21   Elayne Snare, MD  lithium carbonate (ESKALITH) 450 MG CR tablet Take 1 tablet (450 mg total) by mouth at bedtime. 01/10/21   [name], PMHNP  loperamide (IMODIUM A-D) 2 MG tablet Take 2 mg by mouth 2 (two) times daily as needed for diarrhea or loose stools.    [provider]  losartan (COZAAR) 25 MG tablet Take 25 mg by mouth daily. 08/31/19   [provider]  Melatonin 10 MG TBCR Take 10 mg by mouth at bedtime.    [provider]  memantine (NAMENDA) 10 MG tablet TAKE 1 TABLET(10 MG) BY MOUTH EVERY EVENING 01/10/21   [name], PMHNP  metroNIDAZOLE (FLAGYL) 500 MG tablet Take 1 tablet (500 mg total) by mouth 2 (two) times daily. One po bid x 7 days Patient not taking: Reported on 03/28/2021 02/15/21   Sherwood Gambler, MD  Microlet Lancets MISC TEST DAILY AS DIRECTED 06/20/20   Elayne Snare, MD  nitroGLYCERIN (NITROSTAT) 0.4 MG SL tablet  Place 1 tablet (0.4 mg total) under the tongue every 5 (five) minutes as needed for chest pain. 09/14/17   Lyda Jester M, PA-C  OLANZapine (ZYPREXA) 2.5 MG tablet Take 1 tablet (2.5 mg total) by mouth at bedtime. 01/29/21   [name], PMHNP  ondansetron (ZOFRAN) 4 MG tablet Take 1 tablet (4 mg total) by mouth every 8 (eight) hours as needed for nausea or vomiting. 02/15/21   Sherwood Gambler, MD  oxyCODONE (ROXICODONE) 5 MG immediate release tablet Take 1 tablet (5 mg total) by mouth every 6 (six) hours as needed for severe pain. 02/15/21   Sherwood Gambler, MD  oxyCODONE (ROXICODONE) 5 MG immediate release tablet Take 1-2 tablets (5-10 mg total) by mouth every 4 (four) hours as needed for severe pain. 82/42/35   Delora Fuel, MD  Probiotic Product (PROBIOTIC ADVANCED PO) Take by mouth 2 (two) times daily.    [provider]  QUEtiapine (SEROQUEL) 200 MG tablet TAKE 1 TAB PO q 5 pm and 1 tab po QHS 01/10/21   [name], PMHNP  rOPINIRole (REQUIP) 0.5 MG tablet Takes 4 tabs at 5 pm and 3 tabs at bedtime and 1-2 tabs prn 01/10/21   [name], PMHNP  Semaglutide, 1 MG/DOSE, (OZEMPIC, 1 MG/DOSE,) 4 MG/3ML SOPN Inject 1 mg into the skin once a week. 03/19/21   Elayne Snare, MD  Semaglutide, 2 MG/DOSE, (OZEMPIC, 2 MG/DOSE,) 8 MG/3ML SOPN Inject 2  mg weekly 02/07/21   Elayne Snare, MD  tiZANidine (ZANAFLEX) 4 MG tablet Take 1 tablet (4 mg total) by mouth every 6 (six) hours as needed for muscle spasms. 05/21/92   Delora Fuel, MD      Allergies    Codeine, Procaine hcl, Aspirin, Augmentin [amoxicillin-pot clavulanate], Benadryl [diphenhydramine], Diflucan [fluconazole], Dilaudid [hydromorphone hcl], Hydromorphone, Other, Prednisone, Tramadol hcl, and Sulfa antibiotics    Review of Systems   Review of Systems  Constitutional:  Negative for chills and fever.  HENT:  Negative for facial swelling and trouble swallowing.   Eyes:  Negative for photophobia and visual disturbance.   Respiratory:  Negative for cough and shortness of breath.   Cardiovascular:  Negative for chest pain and palpitations.  Gastrointestinal:  Positive for abdominal pain. Negative for nausea and vomiting.  Endocrine: Negative for polydipsia and polyuria.  Genitourinary:  Negative for difficulty urinating and hematuria.  Musculoskeletal:  Negative for gait problem and joint swelling.  Skin:  Negative for pallor and rash.  Neurological:  Negative for syncope and headaches.  Psychiatric/Behavioral:  Negative for agitation and confusion.    Physical Exam Updated Vital Signs BP (!) 123/59    Pulse 79    Temp 98.4 F (36.9 C)    Resp 20    SpO2 96%  Physical Exam Vitals and nursing note reviewed.  Constitutional:      General: She is not in acute distress.    Appearance: Normal appearance.  HENT:     Head: Normocephalic and atraumatic.     Right Ear: External ear normal.     Left Ear: External ear normal.     Nose: Nose normal.     Mouth/Throat:     Mouth: Mucous membranes are moist.  Eyes:     General: No scleral icterus.       Right eye: No discharge.        Left eye: No discharge.  Cardiovascular:     Rate and Rhythm: Normal rate and regular rhythm.     Pulses: Normal pulses.     Heart sounds: Normal heart sounds.  Pulmonary:     Effort: Pulmonary effort is normal. No respiratory distress.     Breath sounds: Normal breath sounds.  Abdominal:     General: Abdomen is flat.     Palpations: Abdomen is soft.     Tenderness: There is abdominal tenderness in the left lower quadrant. There is no guarding or rebound.       Comments: Not peritoneal  Musculoskeletal:        General: Normal range of motion.     Cervical back: Normal range of motion.     Right lower leg: No edema.     Left lower leg: No edema.  Skin:    General: Skin is warm and dry.     Capillary Refill: Capillary refill takes less than 2 seconds.  Neurological:     Mental Status: She is alert.  Psychiatric:         Mood and Affect: Mood normal.        Behavior: Behavior normal.    ED Results / Procedures / Treatments   Labs (all labs ordered are listed, but only abnormal results are displayed) Labs Reviewed  COMPREHENSIVE METABOLIC PANEL - Abnormal; Notable for the following components:      Result Value   Glucose, Bld 212 (*)    AST 11 (*)    All other components within normal limits  CBC -  Abnormal; Notable for the following components:   WBC 10.6 (*)    All other components within normal limits  LIPASE, BLOOD  URINALYSIS, ROUTINE W REFLEX MICROSCOPIC    EKG None  Radiology No results found.  Procedures Procedures    Medications Ordered in ED Medications  iohexol (OMNIPAQUE) 300 MG/ML solution 85 mL (has no administration in time range)  sodium chloride 0.9 % bolus 1,000 mL (has no administration in time range)  ondansetron (ZOFRAN) injection 4 mg (has no administration in time range)  morphine 4 MG/ML injection 4 mg (has no administration in time range)    ED Course/ Medical Decision Making/ A&P                           Medical Decision Making Amount and/or Complexity of Data Reviewed Labs: ordered. Radiology: ordered.  Risk Prescription drug management.    CC: Abdominal pain  This patient presents to the Emergency Department for the above complaint. This involves an extensive number of treatment options and is a complaint that carries with it a high risk of complications and morbidity. Vital signs were reviewed. Serious etiologies considered.  Record review:  Previous records obtained and reviewed    Work up as above, notable for:  Labs & imaging results that were available during my care of the patient were reviewed by me and considered in my medical decision making.   I ordered imaging studies which included CT abdomen and pelvis which is pending at time of shift change.  Labs reviewed, minimal leukocytosis on CBC at 10.6.  Afebrile.  Does not appear  to be septic. Renal function stable. HDS.  Cardiac monitoring reviewed and interpreted personally which shows normal sinus rhythm   Management: Analgesics, IV fluids  Reassessment:  Pain improved after intervention. Pending CT at this time. Care handed off to incoming EDP pending CT results and re-assessment to dispo. She is HDS. Tolerating PO.    Past med hx and surg hx as above    This chart was dictated using voice recognition software.  Despite best efforts to proofread,  errors can occur which can change the documentation meaning.         Final Clinical Impression(s) / ED Diagnoses Final diagnoses:  None    Rx / DC Orders ED Discharge Orders     None         Jeanell Sparrow, DO 03/31/21 1646

## 2021-03-31 NOTE — ED Notes (Signed)
Patient transported to CT 

## 2021-03-31 NOTE — ED Triage Notes (Signed)
Pt c/o abdominal pain, nausea, and diarrhea for over 1 week. Reports it feels similar to diverticulitis.

## 2021-04-08 DIAGNOSIS — Z8719 Personal history of other diseases of the digestive system: Secondary | ICD-10-CM | POA: Diagnosis not present

## 2021-04-08 DIAGNOSIS — K5732 Diverticulitis of large intestine without perforation or abscess without bleeding: Secondary | ICD-10-CM | POA: Diagnosis not present

## 2021-04-08 DIAGNOSIS — K589 Irritable bowel syndrome without diarrhea: Secondary | ICD-10-CM | POA: Diagnosis not present

## 2021-04-08 DIAGNOSIS — Z20822 Contact with and (suspected) exposure to covid-19: Secondary | ICD-10-CM | POA: Diagnosis not present

## 2021-04-13 ENCOUNTER — Encounter (HOSPITAL_BASED_OUTPATIENT_CLINIC_OR_DEPARTMENT_OTHER): Payer: Self-pay | Admitting: Emergency Medicine

## 2021-04-13 ENCOUNTER — Emergency Department (HOSPITAL_BASED_OUTPATIENT_CLINIC_OR_DEPARTMENT_OTHER)
Admission: EM | Admit: 2021-04-13 | Discharge: 2021-04-13 | Disposition: A | Discharge status: Home / Self Care | Payer: Medicare Other | Attending: Emergency Medicine | Admitting: Emergency Medicine

## 2021-04-13 ENCOUNTER — Emergency Department (HOSPITAL_BASED_OUTPATIENT_CLINIC_OR_DEPARTMENT_OTHER): Payer: Medicare Other

## 2021-04-13 DIAGNOSIS — D72829 Elevated white blood cell count, unspecified: Secondary | ICD-10-CM | POA: Insufficient documentation

## 2021-04-13 DIAGNOSIS — I700 Atherosclerosis of aorta: Secondary | ICD-10-CM | POA: Diagnosis not present

## 2021-04-13 DIAGNOSIS — K5730 Diverticulosis of large intestine without perforation or abscess without bleeding: Secondary | ICD-10-CM | POA: Diagnosis not present

## 2021-04-13 DIAGNOSIS — K3184 Gastroparesis: Secondary | ICD-10-CM | POA: Insufficient documentation

## 2021-04-13 DIAGNOSIS — R109 Unspecified abdominal pain: Secondary | ICD-10-CM | POA: Diagnosis present

## 2021-04-13 LAB — CBC WITH DIFFERENTIAL/PLATELET
Abs Immature Granulocytes: 0.09 10*3/uL — ABNORMAL HIGH (ref 0.00–0.07)
Basophils Absolute: 0 10*3/uL (ref 0.0–0.1)
Basophils Relative: 0 %
Eosinophils Absolute: 0.1 10*3/uL (ref 0.0–0.5)
Eosinophils Relative: 1 %
HCT: 46.9 % — ABNORMAL HIGH (ref 36.0–46.0)
Hemoglobin: 14.4 g/dL (ref 12.0–15.0)
Immature Granulocytes: 1 %
Lymphocytes Relative: 15 %
Lymphs Abs: 1.7 10*3/uL (ref 0.7–4.0)
MCH: 28.2 pg (ref 26.0–34.0)
MCHC: 30.7 g/dL (ref 30.0–36.0)
MCV: 91.8 fL (ref 80.0–100.0)
Monocytes Absolute: 0.6 10*3/uL (ref 0.1–1.0)
Monocytes Relative: 5 %
Neutro Abs: 8.7 10*3/uL — ABNORMAL HIGH (ref 1.7–7.7)
Neutrophils Relative %: 78 %
Platelets: 250 10*3/uL (ref 150–400)
RBC: 5.11 MIL/uL (ref 3.87–5.11)
RDW: 13.9 % (ref 11.5–15.5)
WBC: 11.2 10*3/uL — ABNORMAL HIGH (ref 4.0–10.5)
nRBC: 0 % (ref 0.0–0.2)

## 2021-04-13 LAB — URINALYSIS, ROUTINE W REFLEX MICROSCOPIC
Bilirubin Urine: NEGATIVE
Glucose, UA: NEGATIVE mg/dL
Hgb urine dipstick: NEGATIVE
Ketones, ur: NEGATIVE mg/dL
Leukocytes,Ua: NEGATIVE
Nitrite: NEGATIVE
Protein, ur: NEGATIVE mg/dL
Specific Gravity, Urine: 1.01 (ref 1.005–1.030)
pH: 7 (ref 5.0–8.0)

## 2021-04-13 LAB — COMPREHENSIVE METABOLIC PANEL
ALT: 14 U/L (ref 0–44)
AST: 13 U/L — ABNORMAL LOW (ref 15–41)
Albumin: 4.3 g/dL (ref 3.5–5.0)
Alkaline Phosphatase: 80 U/L (ref 38–126)
Anion gap: 10 (ref 5–15)
BUN: 12 mg/dL (ref 8–23)
CO2: 26 mmol/L (ref 22–32)
Calcium: 10.8 mg/dL — ABNORMAL HIGH (ref 8.9–10.3)
Chloride: 102 mmol/L (ref 98–111)
Creatinine, Ser: 0.79 mg/dL (ref 0.44–1.00)
GFR, Estimated: >60 mL/min (ref >60)
Glucose, Bld: 141 mg/dL — ABNORMAL HIGH (ref 70–99)
Potassium: 4.4 mmol/L (ref 3.5–5.1)
Sodium: 138 mmol/L (ref 135–145)
Total Bilirubin: 0.4 mg/dL (ref 0.3–1.2)
Total Protein: 7.4 g/dL (ref 6.5–8.1)

## 2021-04-13 LAB — LIPASE, BLOOD: Lipase: 25 U/L (ref 11–51)

## 2021-04-13 MED ORDER — IOHEXOL 300 MG/ML  SOLN
100.0000 mL | Freq: Once | INTRAMUSCULAR | Status: AC | PRN
  Administered 2021-04-13: 100 mL via INTRAVENOUS

## 2021-04-13 MED ORDER — SODIUM CHLORIDE 0.9 % IV SOLN
6.2500 mg | Freq: Four times a day (QID) | INTRAVENOUS | Status: DC | PRN
  Administered 2021-04-13: 6.25 mg via INTRAVENOUS
  Filled 2021-04-13: qty 0.25

## 2021-04-13 MED ORDER — METOCLOPRAMIDE HCL 5 MG PO TABS: 5.0000 mg | ORAL_TABLET | Freq: Four times a day (QID) | ORAL | 0 refills | Status: DC

## 2021-04-13 MED ORDER — PROMETHAZINE HCL 25 MG/ML IJ SOLN
INTRAMUSCULAR | Status: AC
  Filled 2021-04-13: qty 1

## 2021-04-13 MED ORDER — PROMETHAZINE HCL 12.5 MG PO TABS: 12.5000 mg | ORAL_TABLET | Freq: Four times a day (QID) | ORAL | 0 refills | Status: DC | PRN

## 2021-04-13 MED ORDER — METOCLOPRAMIDE HCL 5 MG/ML IJ SOLN
10.0000 mg | Freq: Once | INTRAMUSCULAR | Status: AC
Start: 2021-04-13 — End: 2021-04-13
  Administered 2021-04-13: 10 mg via INTRAVENOUS
  Filled 2021-04-13: qty 2

## 2021-04-13 NOTE — Discharge Instructions (Addendum)
He was diagnosed with gastroparesis today.  Fortunately, you seem to improve significantly with the Reglan, so I have sent you home with a short supply of this as well as a short supply of Phenergan.  Continue drinking plenty of water.  Incidentally, CT shows that your sigmoid diverticulitis is improving since it was last viewed a week and a half ago.  Follow-up with your GI doctor for further management.  Continue taking your Cipro and Flagyl as prescribed.  Return if you have a sudden worsening of symptoms.

## 2021-04-13 NOTE — ED Provider Notes (Signed)
[facility] DEPT Provider Note   CSN: [id number] Arrival date & time: 04/13/21  1214     History  Chief Complaint  Patient presents with   Abdominal Pain    [name] [name] is a 76 y.o. female With history of recurrent gastritis who presents to the ED at the recommended of her GI doctor for emesis and diverticulitis.  Patient is currently taking ciprofloxacin and Flagyl for recurrent diverticulitis that she has had for several weeks.  She has not had significant provement and feels that the diverticulitis is not improving.  Since starting these 2 medications last week, she states that she has not been able to eat anything.  She feels that she cannot keep anything down.  Her husband notes the other day she had a sweet potato for dinner, and threw it up undigested the next morning.  On her last visit she was given referral to surgery but has not scheduled this appointment yet.  Her GI expresses concern that patient may need to be admitted for her diverticulitis.  She is currently not in very much pain, denies fevers, chills, chest pain, shortness of breath.  She does endorse sensation of bloating.  She has been taking Zofran without any relief in her nausea and vomiting.   Abdominal Pain Associated symptoms: nausea and vomiting   Associated symptoms: no fever and no shortness of breath       Home Medications Prior to Admission medications   Medication Sig Start Date End Date Taking? Authorizing Provider  metoCLOPramide (REGLAN) 5 MG tablet Take 1 tablet (5 mg total) by mouth every 6 (six) hours for 15 days. 04/13/21 04/28/21 Yes Tonye Pearson, PA-C  promethazine (PHENERGAN) 12.5 MG tablet Take 1 tablet (12.5 mg total) by mouth every 6 (six) hours as needed for nausea or vomiting. 04/13/21  Yes Tonye Pearson, PA-C  acetaminophen (TYLENOL) 325 MG tablet Take 2 tablets (650 mg total) by mouth every 6 (six) hours as needed. 02/11/21   Jeanell Sparrow, DO  ALPRAZolam  Duanne Moron) 0.5 MG tablet Take 1 tablet (0.5 mg total) by mouth 4 (four) times daily as needed for anxiety. 02/19/21   [name], PMHNP  atorvastatin (LIPITOR) 40 MG tablet Take 40 mg by mouth every evening.    [provider]  b complex vitamins capsule Take 1 capsule by mouth daily.    [provider]  BD PEN NEEDLE NANO 2ND GEN 32G X 4 MM MISC USE TO INJECT 5 TIMES DAILY AS DIRECTED 09/20/20   Elayne Snare, MD  cefUROXime (CEFTIN) 250 MG tablet Take 1 tablet (250 mg total) by mouth 2 (two) times daily with a meal. 03/31/21   Lacretia Leigh, MD  celecoxib (CELEBREX) 100 MG capsule Take 1 capsule (100 mg total) by mouth 2 (two) times daily. Patient not taking: Reported on 04/02/5807 98/33/82   Delora Fuel, MD  cholecalciferol (VITAMIN D3) 25 MCG (1000 UNIT) tablet Take 3,000 Units by mouth daily.    [provider]  dicyclomine (BENTYL) 20 MG tablet Take 1 tablet (20 mg total) by mouth 2 (two) times daily as needed for spasms. 02/15/21   Sherwood Gambler, MD  divalproex (DEPAKOTE) 250 MG DR tablet Take 2 tablets (500 mg total) by mouth at bedtime. 01/10/21   [name], PMHNP  donepezil (ARICEPT) 10 MG tablet Take 1 tablet (10 mg total) by mouth at bedtime. 12/03/20   [name], PMHNP  gabapentin (NEURONTIN) 600 MG tablet Take 600 mg by  mouth 3 (three) times daily. 05/12/19   [provider]  glucose blood (CONTOUR NEXT TEST) test strip USE TO TEST BLOOD SUGAR TWICE DAILY 06/20/20   Elayne Snare, MD  LANTUS SOLOSTAR 100 UNIT/ML Solostar Pen ADMINISTER 64 UNITS UNDER THE SKIN AT BEDTIME 01/13/21   Elayne Snare, MD  levothyroxine (SYNTHROID) 75 MCG tablet TAKE 1 TABLET BY MOUTH EVERY DAY BEFORE BREAKFAST 03/27/21   Elayne Snare, MD  liothyronine (CYTOMEL) 5 MCG tablet TAKE 1 TABLET BY MOUTH DAILY WITH LEVOTHYROXINE. TAKE BEFORE BREAKFAST 03/27/21   Elayne Snare, MD  lithium carbonate (ESKALITH) 450 MG CR tablet Take 1 tablet (450 mg total) by mouth at bedtime. 01/10/21    [name], PMHNP  loperamide (IMODIUM A-D) 2 MG tablet Take 2 mg by mouth 2 (two) times daily as needed for diarrhea or loose stools.    [provider]  losartan (COZAAR) 25 MG tablet Take 25 mg by mouth daily. 08/31/19   [provider]  Melatonin 10 MG TBCR Take 10 mg by mouth at bedtime.    [provider]  memantine (NAMENDA) 10 MG tablet TAKE 1 TABLET(10 MG) BY MOUTH EVERY EVENING 01/10/21   [name], PMHNP  metroNIDAZOLE (FLAGYL) 500 MG tablet Take 1 tablet (500 mg total) by mouth 2 (two) times daily. One po bid x 7 days Patient not taking: Reported on 03/28/2021 02/15/21   Sherwood Gambler, MD  metroNIDAZOLE (FLAGYL) 500 MG tablet Take 1 tablet (500 mg total) by mouth 2 (two) times daily. 03/31/21   Lacretia Leigh, MD  Microlet Lancets MISC TEST DAILY AS DIRECTED 06/20/20   Elayne Snare, MD  nitroGLYCERIN (NITROSTAT) 0.4 MG SL tablet Place 1 tablet (0.4 mg total) under the tongue every 5 (five) minutes as needed for chest pain. 09/14/17   Lyda Jester M, PA-C  OLANZapine (ZYPREXA) 2.5 MG tablet Take 1 tablet (2.5 mg total) by mouth at bedtime. 01/29/21   [name], PMHNP  ondansetron (ZOFRAN) 4 MG tablet Take 1 tablet (4 mg total) by mouth every 8 (eight) hours as needed for nausea or vomiting. 02/15/21   Sherwood Gambler, MD  oxyCODONE (ROXICODONE) 5 MG immediate release tablet Take 1 tablet (5 mg total) by mouth every 6 (six) hours as needed for severe pain. 02/15/21   Sherwood Gambler, MD  oxyCODONE (ROXICODONE) 5 MG immediate release tablet Take 1-2 tablets (5-10 mg total) by mouth every 4 (four) hours as needed for severe pain. 38/18/29   Delora Fuel, MD  oxyCODONE-acetaminophen (PERCOCET/ROXICET) 5-325 MG tablet Take 1-2 tablets by mouth every 6 (six) hours as needed for severe pain. 03/31/21   Lacretia Leigh, MD  Probiotic Product (PROBIOTIC ADVANCED PO) Take by mouth 2 (two) times daily.    [provider]  QUEtiapine (SEROQUEL) 200  MG tablet TAKE 1 TAB PO q 5 pm and 1 tab po QHS 01/10/21   [name], PMHNP  rOPINIRole (REQUIP) 0.5 MG tablet Takes 4 tabs at 5 pm and 3 tabs at bedtime and 1-2 tabs prn 01/10/21   [name], PMHNP  Semaglutide, 1 MG/DOSE, (OZEMPIC, 1 MG/DOSE,) 4 MG/3ML SOPN Inject 1 mg into the skin once a week. 03/19/21   Elayne Snare, MD  Semaglutide, 2 MG/DOSE, (OZEMPIC, 2 MG/DOSE,) 8 MG/3ML SOPN Inject 2 mg weekly 02/07/21   Elayne Snare, MD  sucralfate (CARAFATE) 1 g tablet Take 1 tablet (1 g total) by mouth 4 (four) times daily -  with meals and at bedtime for 7 days. 03/31/21 04/07/21  Wynona Dove A, DO  tiZANidine (ZANAFLEX) 4 MG tablet Take 1 tablet (4 mg total) by mouth every 6 (six) hours as needed for muscle spasms. 10/62/69   Delora Fuel, MD      Allergies    Codeine, Procaine hcl, Aspirin, Augmentin [amoxicillin-pot clavulanate], Benadryl [diphenhydramine], Diflucan [fluconazole], Dilaudid [hydromorphone hcl], Hydromorphone, Other, Prednisone, Tramadol hcl, and Sulfa antibiotics    Review of Systems   Review of Systems  Constitutional:  Negative for fever.  HENT: Negative.    Eyes: Negative.   Respiratory:  Negative for shortness of breath.   Cardiovascular: Negative.   Gastrointestinal:  Positive for abdominal pain, nausea and vomiting.  Endocrine: Negative.   Genitourinary: Negative.   Musculoskeletal: Negative.   Skin:  Negative for rash.  Neurological:  Negative for headaches.  All other systems reviewed and are negative.  Physical Exam Updated Vital Signs BP 125/65 (BP Location: Right Arm)    Pulse 89    Temp 97.8 F (36.6 C) (Oral)    Resp 16    Ht 5\' 1"  (1.549 m)    Wt 79.4 kg    SpO2 92%    BMI 33.07 kg/m  Physical Exam Vitals and nursing note reviewed.  Constitutional:      General: She is not in acute distress.    Appearance: She is not ill-appearing.  HENT:     Head: Atraumatic.  Eyes:     Conjunctiva/sclera: Conjunctivae normal.  Cardiovascular:     Rate and  Rhythm: Normal rate and regular rhythm.     Pulses: Normal pulses.     Heart sounds: No murmur heard. Pulmonary:     Effort: Pulmonary effort is normal. No respiratory distress.     Breath sounds: Normal breath sounds.  Abdominal:     General: There is distension.     Palpations: Abdomen is soft.     Tenderness: There is no abdominal tenderness.     Comments: Abdomen is distended, tympanic to percussion.  Mild tenderness palpation left lower quadrant.  Hypoactive bowel sounds.  Musculoskeletal:        General: Normal range of motion.     Cervical back: Normal range of motion.  Skin:    General: Skin is warm and dry.     Capillary Refill: Capillary refill takes less than 2 seconds.  Neurological:     General: No focal deficit present.     Mental Status: She is alert.  Psychiatric:        Mood and Affect: Mood normal.    ED Results / Procedures / Treatments   Labs (all labs ordered are listed, but only abnormal results are displayed) Labs Reviewed  CBC WITH DIFFERENTIAL/PLATELET - Abnormal; Notable for the following components:      Result Value   WBC 11.2 (*)    HCT 46.9 (*)    Neutro Abs 8.7 (*)    Abs Immature Granulocytes 0.09 (*)    All other components within normal limits  COMPREHENSIVE METABOLIC PANEL - Abnormal; Notable for the following components:   Glucose, Bld 141 (*)    Calcium 10.8 (*)    AST 13 (*)    All other components within normal limits  LIPASE, BLOOD  URINALYSIS, ROUTINE W REFLEX MICROSCOPIC    EKG None  Radiology CT ABDOMEN PELVIS W CONTRAST  Result Date: 04/13/2021 CLINICAL DATA:  Left lower quadrant pain evaluate for diverticulitis. EXAM: CT ABDOMEN AND PELVIS WITH CONTRAST TECHNIQUE: Multidetector CT imaging of the abdomen and pelvis  was performed using the standard protocol following bolus administration of intravenous contrast. RADIATION DOSE REDUCTION: This exam was performed according to the departmental dose-optimization program which  includes automated exposure control, adjustment of the mA and/or kV according to patient size and/or use of iterative reconstruction technique. CONTRAST:  157mL OMNIPAQUE IOHEXOL 300 MG/ML  SOLN COMPARISON:  03/31/2021 FINDINGS: Lower chest: 4 mm left lower lobe lung nodule is unchanged, image 4/4. Hepatobiliary: No focal liver abnormality identified. Gallbladder is partially decompressed. No significant bile duct dilatation. Pancreas: Unremarkable. No pancreatic ductal dilatation or surrounding inflammatory changes. Spleen: Normal in size without focal abnormality. Adrenals/Urinary Tract: Normal adrenal glands. No kidney mass. Similar appearance of right renal cortical atrophy with right hydronephrosis. As noted previously there is an abrupt caliber change of the proximal right renal collecting system at the level of the right UPJ suggestive of chronic UPJ stenosis. Urinary bladder is unremarkable. Stomach/Bowel: Stomach appears normal. The appendix is not confidently identified sigmoid diverticulosis identified. Previously noted wall thickening of the mid sigmoid colon appears improved from the previous exam with decrease surrounding inflammatory fat stranding compatible with resolving diverticulitis. No significant free fluid or fluid collections. Vascular/Lymphatic: Aortic atherosclerosis without aneurysm. No abdominopelvic adenopathy identified. Reproductive: Status post hysterectomy. No adnexal masses. Other: No free fluid or fluid collections. Musculoskeletal: Previous right hip arthroplasty. No acute or suspicious osseous findings. IMPRESSION: 1. Improved appearance of sigmoid diverticulitis. No evidence for abscess or bowel obstruction. 2. Unchanged appearance of right renal cortical atrophy with right hydronephrosis and abrupt caliber change of the proximal right renal collecting system at the level of the right UPJ suggestive of chronic UPJ stenosis. 3. Unchanged 4 mm left lower lobe lung nodule. No  routine follow-up imaging is recommended per Fleischner Society Guidelines. 4. Aortic Atherosclerosis (ICD10-I70.0). Electronically Signed   By: Kerby Moors M.D.   On: 04/13/2021 15:38    Procedures Procedures   Medications Ordered in ED Medications  promethazine (PHENERGAN) 6.25 mg in sodium chloride 0.9 % 50 mL IVPB (0 mg Intravenous Stopped 04/13/21 1800)  promethazine (PHENERGAN) 25 MG/ML injection (has no administration in time range)  metoCLOPramide (REGLAN) injection 10 mg (10 mg Intravenous Given 04/13/21 1420)  iohexol (OMNIPAQUE) 300 MG/ML solution 100 mL (100 mLs Intravenous Contrast Given 04/13/21 1516)    ED Course/ Medical Decision Making/ A&P                           Medical Decision Making Amount and/or Complexity of Data Reviewed Labs: ordered. Radiology: ordered.  Risk Prescription drug management.   History:  Per HPI  Initial impression:  This patient presents to the ED for concern of abdominal pain, this involves an extensive number of treatment options, and is a complaint that carries with it a high risk of complications and morbidity.   The differential diagnosis for generalized abdominal pain includes, but is not limited to AAA, gastroenteritis, appendicitis, Bowel obstruction, Bowel perforation. Gastroparesis, DKA, Hernia, Inflammatory bowel disease, mesenteric ischemia, pancreatitis, peritonitis SBP, volvulus. On exam, patient is well-appearing, nontoxic.  Vitals are stable.  Abdominal exam was concerning for distention, likely gastroparesis versus less likely bowel obstruction.  Will obtain labs and CT abdomen for further assessment.  We will also give medication for gastroparesis.   ED Course: Lab work was reassuring.  Mildly elevated white count that is improved since last. CT scan of abdomen shows improving sigmoid diverticulitis from last scan.  No evidence of bowel obstruction or abscess.  Patient symptoms have improved with Phenergan and Reglan. PO  trial successful and is tolerating both fluids and some crackers.  Lab Tests and EKG:  I Ordered, reviewed, and interpreted labs and EKG.  The pertinent results include:  UA negative Lipase and CMP P normal CBC with mild leukocytosis   Imaging Studies ordered:  I ordered imaging studies including CT abdomen which shows improving diverticulitis without evidence of obstruction or abscess I independently visualized and interpreted imaging and I agree with the radiologist interpretation.    Cardiac Monitoring:  The patient was maintained on a cardiac monitor.  I personally viewed and interpreted the cardiac monitored which showed an underlying rhythm of: NSR   Medicines ordered and prescription drug management:  I ordered medication including: Reglan 10 mg and Phenergan 6.25 for nausea and suspected gastroparesis Reevaluation of the patient after these medicines showed that the patient resolved I have reviewed the patients home medicines and have made adjustments as needed     Disposition:  After consideration of the diagnostic results, physical exam, history and the patients response to treatment feel that the patent would benefit from discharge with strict return precautions and outpatient follow-up.   Gastroparesis: Patient's physical exam, symptoms and work-up are consistent with gastroparesis.  CT reassuring for no obstruction.  Incidentally, diverticulitis appears improved since last.  Patient symptoms resolved with Reglan and Phenergan.  Sent home prescriptions for both of these to use until she can follow-up with her GI physician outpatient.  All questions were asked and answered.  Discharged home in good condition.     Final Clinical Impression(s) / ED Diagnoses Final diagnoses:  Gastroparesis    Rx / DC Orders ED Discharge Orders          Ordered    metoCLOPramide (REGLAN) 5 MG tablet  Every 6 hours        04/13/21 1746    promethazine (PHENERGAN) 12.5 MG  tablet  Every 6 hours PRN        04/13/21 1746              Rodena Piety 04/13/21 1936    Margette Fast, MD 04/22/21 [phone]

## 2021-04-13 NOTE — ED Triage Notes (Signed)
Pt states PCP sent her here for emesis and diverticulitis. Seen here for the same.

## 2021-04-14 ENCOUNTER — Ambulatory Visit: Payer: Medicare Other | Admitting: Psychiatry

## 2021-04-16 DIAGNOSIS — I10 Essential (primary) hypertension: Secondary | ICD-10-CM | POA: Diagnosis not present

## 2021-04-16 DIAGNOSIS — E039 Hypothyroidism, unspecified: Secondary | ICD-10-CM | POA: Diagnosis not present

## 2021-04-16 DIAGNOSIS — E785 Hyperlipidemia, unspecified: Secondary | ICD-10-CM | POA: Diagnosis not present

## 2021-04-16 DIAGNOSIS — E1165 Type 2 diabetes mellitus with hyperglycemia: Secondary | ICD-10-CM | POA: Diagnosis not present

## 2021-04-17 DIAGNOSIS — K5732 Diverticulitis of large intestine without perforation or abscess without bleeding: Secondary | ICD-10-CM | POA: Diagnosis not present

## 2021-04-18 ENCOUNTER — Other Ambulatory Visit: Payer: Self-pay

## 2021-04-18 DIAGNOSIS — F411 Generalized anxiety disorder: Secondary | ICD-10-CM

## 2021-04-18 DIAGNOSIS — F5101 Primary insomnia: Secondary | ICD-10-CM

## 2021-04-18 DIAGNOSIS — F319 Bipolar disorder, unspecified: Secondary | ICD-10-CM

## 2021-04-18 MED ORDER — OLANZAPINE 2.5 MG PO TABS: 2.5000 mg | ORAL_TABLET | Freq: Every day | ORAL | 0 refills | Status: DC

## 2021-04-21 ENCOUNTER — Telehealth: Payer: Self-pay | Admitting: Psychiatry

## 2021-04-21 NOTE — Telephone Encounter (Signed)
Pt called needing advise on how many Requip 0.5 mg she can take for really bad restless leg. She has the med. Contact # [phone]

## 2021-04-21 NOTE — Telephone Encounter (Signed)
Called patient to see how she was taking. She was only taking the 4 in the morning and 3 at bedtime. Rx also says 1-2 tabs prn. She will try prn dosing to see if that helps and will call back if that is not beneficial.

## 2021-04-23 DIAGNOSIS — B379 Candidiasis, unspecified: Secondary | ICD-10-CM | POA: Diagnosis not present

## 2021-04-28 ENCOUNTER — Other Ambulatory Visit: Payer: Self-pay | Admitting: Endocrinology

## 2021-04-30 ENCOUNTER — Telehealth: Payer: Self-pay

## 2021-04-30 DIAGNOSIS — K5732 Diverticulitis of large intestine without perforation or abscess without bleeding: Secondary | ICD-10-CM | POA: Diagnosis not present

## 2021-04-30 NOTE — Telephone Encounter (Signed)
[name] [name]- Clinical pharmacist @ [facility] called stating patient has had a lot of GI issues with ozempic and has stopped taking medication and is wanting to go on an alternate medication. He is requesting a callback at [phone]

## 2021-05-01 ENCOUNTER — Other Ambulatory Visit: Payer: Self-pay

## 2021-05-01 DIAGNOSIS — E1165 Type 2 diabetes mellitus with hyperglycemia: Secondary | ICD-10-CM

## 2021-05-01 MED ORDER — OZEMPIC (0.25 OR 0.5 MG/DOSE) 2 MG/1.5ML ~~LOC~~ SOPN: PEN_INJECTOR | SUBCUTANEOUS | 1 refills | Status: DC

## 2021-05-02 ENCOUNTER — Encounter: Payer: Self-pay | Admitting: Psychiatry

## 2021-05-02 ENCOUNTER — Ambulatory Visit (INDEPENDENT_AMBULATORY_CARE_PROVIDER_SITE_OTHER): Payer: Medicare Other | Admitting: Psychiatry

## 2021-05-02 DIAGNOSIS — F319 Bipolar disorder, unspecified: Secondary | ICD-10-CM

## 2021-05-02 DIAGNOSIS — F5101 Primary insomnia: Secondary | ICD-10-CM

## 2021-05-02 DIAGNOSIS — G2581 Restless legs syndrome: Secondary | ICD-10-CM

## 2021-05-02 DIAGNOSIS — F411 Generalized anxiety disorder: Secondary | ICD-10-CM | POA: Diagnosis not present

## 2021-05-02 MED ORDER — ROPINIROLE HCL 0.5 MG PO TABS: ORAL_TABLET | ORAL | 0 refills | Status: DC

## 2021-05-02 MED ORDER — DONEPEZIL HCL 10 MG PO TABS: 10.0000 mg | ORAL_TABLET | Freq: Every day | ORAL | 0 refills | Status: DC

## 2021-05-02 MED ORDER — QUETIAPINE FUMARATE 200 MG PO TABS: ORAL_TABLET | ORAL | 0 refills | Status: DC

## 2021-05-02 MED ORDER — DIVALPROEX SODIUM 250 MG PO DR TAB: 500.0000 mg | DELAYED_RELEASE_TABLET | Freq: Every day | ORAL | 0 refills | Status: DC

## 2021-05-02 MED ORDER — LITHIUM CARBONATE ER 450 MG PO TBCR: 450.0000 mg | EXTENDED_RELEASE_TABLET | Freq: Every day | ORAL | 0 refills | Status: DC

## 2021-05-02 MED ORDER — OLANZAPINE 2.5 MG PO TABS: 2.5000 mg | ORAL_TABLET | Freq: Every day | ORAL | 0 refills | Status: DC

## 2021-05-02 MED ORDER — MEMANTINE HCL 10 MG PO TABS: ORAL_TABLET | ORAL | 0 refills | Status: DC
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
## 2021-05-05 DIAGNOSIS — R197 Diarrhea, unspecified: Secondary | ICD-10-CM | POA: Diagnosis not present

## 2021-05-05 NOTE — Telephone Encounter (Signed)
Patient has been notified and will reach out if any other concerns

## 2021-05-06 ENCOUNTER — Other Ambulatory Visit: Payer: Self-pay

## 2021-05-06 DIAGNOSIS — Z794 Long term (current) use of insulin: Secondary | ICD-10-CM

## 2021-05-06 DIAGNOSIS — E119 Type 2 diabetes mellitus without complications: Secondary | ICD-10-CM

## 2021-05-06 DIAGNOSIS — Z01810 Encounter for preprocedural cardiovascular examination: Secondary | ICD-10-CM | POA: Diagnosis not present

## 2021-05-06 DIAGNOSIS — K580 Irritable bowel syndrome with diarrhea: Secondary | ICD-10-CM | POA: Diagnosis not present

## 2021-05-06 DIAGNOSIS — K5732 Diverticulitis of large intestine without perforation or abscess without bleeding: Secondary | ICD-10-CM | POA: Diagnosis not present

## 2021-05-06 DIAGNOSIS — K5792 Diverticulitis of intestine, part unspecified, without perforation or abscess without bleeding: Secondary | ICD-10-CM | POA: Diagnosis not present

## 2021-05-06 DIAGNOSIS — E1159 Type 2 diabetes mellitus with other circulatory complications: Secondary | ICD-10-CM | POA: Diagnosis not present

## 2021-05-06 DIAGNOSIS — Z01812 Encounter for preprocedural laboratory examination: Secondary | ICD-10-CM | POA: Diagnosis not present

## 2021-05-06 DIAGNOSIS — R413 Other amnesia: Secondary | ICD-10-CM | POA: Diagnosis not present

## 2021-05-06 DIAGNOSIS — E785 Hyperlipidemia, unspecified: Secondary | ICD-10-CM | POA: Diagnosis not present

## 2021-05-06 DIAGNOSIS — I10 Essential (primary) hypertension: Secondary | ICD-10-CM | POA: Diagnosis not present

## 2021-05-06 DIAGNOSIS — E039 Hypothyroidism, unspecified: Secondary | ICD-10-CM | POA: Diagnosis not present

## 2021-05-06 DIAGNOSIS — Z8619 Personal history of other infectious and parasitic diseases: Secondary | ICD-10-CM | POA: Diagnosis not present

## 2021-05-06 DIAGNOSIS — Z79899 Other long term (current) drug therapy: Secondary | ICD-10-CM | POA: Diagnosis not present

## 2021-05-06 DIAGNOSIS — G2581 Restless legs syndrome: Secondary | ICD-10-CM | POA: Diagnosis not present

## 2021-05-06 DIAGNOSIS — Z01818 Encounter for other preprocedural examination: Secondary | ICD-10-CM | POA: Diagnosis not present

## 2021-05-06 MED ORDER — LANTUS SOLOSTAR 100 UNIT/ML ~~LOC~~ SOPN: PEN_INJECTOR | SUBCUTANEOUS | 2 refills | Status: DC

## 2021-05-07 DIAGNOSIS — I10 Essential (primary) hypertension: Secondary | ICD-10-CM | POA: Diagnosis not present

## 2021-05-07 DIAGNOSIS — K5792 Diverticulitis of intestine, part unspecified, without perforation or abscess without bleeding: Secondary | ICD-10-CM | POA: Diagnosis not present

## 2021-05-07 HISTORY — PX: COLON RESECTION: SHX5231

## 2021-05-14 DIAGNOSIS — Z96641 Presence of right artificial hip joint: Secondary | ICD-10-CM | POA: Diagnosis not present

## 2021-05-14 DIAGNOSIS — Z880 Allergy status to penicillin: Secondary | ICD-10-CM | POA: Diagnosis not present

## 2021-05-14 DIAGNOSIS — K651 Peritoneal abscess: Secondary | ICD-10-CM | POA: Diagnosis not present

## 2021-05-14 DIAGNOSIS — M797 Fibromyalgia: Secondary | ICD-10-CM | POA: Diagnosis not present

## 2021-05-14 DIAGNOSIS — E039 Hypothyroidism, unspecified: Secondary | ICD-10-CM | POA: Diagnosis not present

## 2021-05-14 DIAGNOSIS — E785 Hyperlipidemia, unspecified: Secondary | ICD-10-CM | POA: Diagnosis not present

## 2021-05-14 DIAGNOSIS — K5732 Diverticulitis of large intestine without perforation or abscess without bleeding: Secondary | ICD-10-CM | POA: Diagnosis not present

## 2021-05-14 DIAGNOSIS — K5720 Diverticulitis of large intestine with perforation and abscess without bleeding: Secondary | ICD-10-CM | POA: Diagnosis not present

## 2021-05-14 DIAGNOSIS — E1143 Type 2 diabetes mellitus with diabetic autonomic (poly)neuropathy: Secondary | ICD-10-CM | POA: Diagnosis not present

## 2021-05-14 DIAGNOSIS — G2581 Restless legs syndrome: Secondary | ICD-10-CM | POA: Diagnosis not present

## 2021-05-14 DIAGNOSIS — F4310 Post-traumatic stress disorder, unspecified: Secondary | ICD-10-CM | POA: Diagnosis not present

## 2021-05-14 DIAGNOSIS — I10 Essential (primary) hypertension: Secondary | ICD-10-CM | POA: Diagnosis not present

## 2021-05-14 DIAGNOSIS — Z882 Allergy status to sulfonamides status: Secondary | ICD-10-CM | POA: Diagnosis not present

## 2021-05-14 DIAGNOSIS — F419 Anxiety disorder, unspecified: Secondary | ICD-10-CM | POA: Diagnosis not present

## 2021-05-14 DIAGNOSIS — E669 Obesity, unspecified: Secondary | ICD-10-CM | POA: Diagnosis not present

## 2021-05-14 DIAGNOSIS — Z888 Allergy status to other drugs, medicaments and biological substances status: Secondary | ICD-10-CM | POA: Diagnosis not present

## 2021-05-14 DIAGNOSIS — F32A Depression, unspecified: Secondary | ICD-10-CM | POA: Diagnosis not present

## 2021-05-14 DIAGNOSIS — Z881 Allergy status to other antibiotic agents status: Secondary | ICD-10-CM | POA: Diagnosis not present

## 2021-05-14 DIAGNOSIS — K219 Gastro-esophageal reflux disease without esophagitis: Secondary | ICD-10-CM | POA: Diagnosis not present

## 2021-05-14 DIAGNOSIS — F319 Bipolar disorder, unspecified: Secondary | ICD-10-CM | POA: Diagnosis not present

## 2021-05-14 DIAGNOSIS — Z885 Allergy status to narcotic agent status: Secondary | ICD-10-CM | POA: Diagnosis not present

## 2021-05-14 DIAGNOSIS — E119 Type 2 diabetes mellitus without complications: Secondary | ICD-10-CM | POA: Diagnosis not present

## 2021-05-14 DIAGNOSIS — K5730 Diverticulosis of large intestine without perforation or abscess without bleeding: Secondary | ICD-10-CM | POA: Diagnosis not present

## 2021-05-14 DIAGNOSIS — M159 Polyosteoarthritis, unspecified: Secondary | ICD-10-CM | POA: Diagnosis not present

## 2021-05-14 DIAGNOSIS — G8918 Other acute postprocedural pain: Secondary | ICD-10-CM | POA: Diagnosis not present

## 2021-05-14 DIAGNOSIS — I340 Nonrheumatic mitral (valve) insufficiency: Secondary | ICD-10-CM | POA: Diagnosis not present

## 2021-05-14 DIAGNOSIS — E1142 Type 2 diabetes mellitus with diabetic polyneuropathy: Secondary | ICD-10-CM | POA: Diagnosis not present

## 2021-05-14 DIAGNOSIS — M419 Scoliosis, unspecified: Secondary | ICD-10-CM | POA: Diagnosis not present

## 2021-05-14 DIAGNOSIS — K3184 Gastroparesis: Secondary | ICD-10-CM | POA: Diagnosis not present

## 2021-05-15 ENCOUNTER — Other Ambulatory Visit: Payer: Self-pay

## 2021-05-15 DIAGNOSIS — F5101 Primary insomnia: Secondary | ICD-10-CM

## 2021-05-15 DIAGNOSIS — F319 Bipolar disorder, unspecified: Secondary | ICD-10-CM

## 2021-05-15 DIAGNOSIS — F411 Generalized anxiety disorder: Secondary | ICD-10-CM

## 2021-05-15 MED ORDER — OLANZAPINE 2.5 MG PO TABS: 2.5000 mg | ORAL_TABLET | Freq: Every day | ORAL | 0 refills | Status: DC

## 2021-05-16 DIAGNOSIS — K5730 Diverticulosis of large intestine without perforation or abscess without bleeding: Secondary | ICD-10-CM | POA: Diagnosis not present

## 2021-05-20 ENCOUNTER — Other Ambulatory Visit: Payer: Self-pay | Admitting: Endocrinology

## 2021-05-20 ENCOUNTER — Telehealth: Payer: Self-pay

## 2021-05-20 MED ORDER — INSULIN LISPRO (1 UNIT DIAL) 100 UNIT/ML (KWIKPEN): PEN_INJECTOR | SUBCUTANEOUS | 0 refills | Status: DC

## 2021-05-20 NOTE — Telephone Encounter (Signed)
Just received message From After hours nurse on 05/17/21-Caller (daughter) states their blood sugar is at 268 before lunch. Recently d/c from hospital 05/16/21 for sigmoidectomy. She is type 2 DM. No insulin. Takes 64 units of Lantus in the evening.  ? ?I called patients daughter to see if patient is still experiencing high blood sugars. She states they did come back down that day. Asked how they have been the past few days and states patient did not check it yesterday or today. I had her check patient blood sugar while I was on the phone and blood sugar was 259. Only insulin she is taking is lantus which is 64 units. Please advise ?

## 2021-05-22 ENCOUNTER — Encounter: Payer: Self-pay | Admitting: Psychiatry

## 2021-05-22 ENCOUNTER — Ambulatory Visit (INDEPENDENT_AMBULATORY_CARE_PROVIDER_SITE_OTHER): Payer: Medicare Other | Admitting: Psychiatry

## 2021-05-22 DIAGNOSIS — F319 Bipolar disorder, unspecified: Secondary | ICD-10-CM

## 2021-05-22 DIAGNOSIS — F411 Generalized anxiety disorder: Secondary | ICD-10-CM

## 2021-05-22 NOTE — Progress Notes (Addendum)
[name] ?[id number] ?1945/05/04 ?76 y.o. ? ?Virtual Visit via Telephone Note ? ?I connected with pt on 05/22/21 at 10:30 AM EDT by telephone and verified that I am speaking with the correct person using two identifiers. ?  ?I discussed the limitations, risks, security and privacy concerns of performing an evaluation and management service by telephone and the availability of in person appointments. I also discussed with the patient that there may be a patient responsible charge related to this service. The patient expressed understanding and agreed to proceed. ?  ?I discussed the assessment and treatment plan with the patient. The patient was provided an opportunity to ask questions and all were answered. The patient agreed with the plan and demonstrated an understanding of the instructions. ?  ?The patient was advised to call back or seek an in-person evaluation if the symptoms worsen or if the condition fails to improve as anticipated. ? ?I provided 18 minutes of non-face-to-face time during this encounter.  The patient was located at home.  The provider was located at [place]. ? ? ?[name], PMHNP ? ? ?Subjective:  ? ?Patient ID:  [name] [name] is a 76 y.o. (DOB 11/09/1945) female. ? ?Chief Complaint:  ?Chief Complaint  ?Patient presents with  ? Follow-up  ?  Bipolar, Anxiety, sleep disturbance  ? ? ?HPI ?[name] presents for follow-up of Bipolar D/O, Anxiety, and insomnia. She reports that her surgery went well and she did not need to have an ostomy. She was discharged on Friday. She reports that her pain has improved. She reports that she had severe anxiety before the surgery and anxiety is now much lower. She reports that she has been sleeping well. She has slept 8-9 hours the last few nights. She reports that her mood has been "good... been in a better mood since all of this is over." She reports that she was "a little teary" immediately after surgery and felt like  this was a "let down" after being stressed. Denies depressed mood. She has been trying to get up and walk some. She reports that she continues to feel tired and "weak" after surgery. She reports that her motivation is ok. Appetite is improving and is currently on a soft diet. She reports difficulty with concentration and memory. Denies SI.  ? ?She reports that she did not start Olanzapine and did not feel like she needed it.  ? ?Has been taking Alprazolam TID. ?Continuing to see Beckey Downing, [facility]. ? ?Past Medication Trials: ?Tegretol-adverse reaction ?Lamictal-itching, swollen lymph nodes ?Trileptal-ineffective ?Depakote- has taken long-term with some benefit ?Gabapentin-prescribed for RLS, pain, and anxiety.  Unable to tolerate doses above 400 mg 3 times daily ?Topamax ?Gabitril ?Lyrica ?Keppra ?Zonegran ?[place]-intermittently effective ?Xanax-effective ?Ativan ?Valium ?Klonopin-patient reports feeling "peculiar" ?Lithium-has taken long-term with some improvement.  Unable to tolerate doses greater than 450 mg ?Seroquel- helpful for racing thoughts and mood signs and symptoms.  Daytime somnolence with higher doses.  Has caused RLS without Requip. ?Geodon-tolerability issues ?Rexulti-EPS ?Latuda-EPS ?Vraylar-EPS ?Saphris-excessive sedation and severe RLS ?Abilify-may have exacerbated gambling ?Risperdal ?Olanzapine- RLS, increased appetite, wt gain ?Perphenazine ?Loxapine- severe adverse effects at low dose. Had weakness, twitches ?BuSpar-ineffective ?Prozac-effective and then stopped working ?Zoloft-could not tolerate ?Lexapro-has used short-term to alleviate depressive signs and symptoms ?Remeron ?Wellbutrin ?Vistaril- ineffective ?Requip- takes due to restless legs secondary to Seroquel.  Has taken long-term and reports being on higher doses in the past.  Denies correlation between Requip and  gambling. ?Pramipexole ?NAC ?Deplin ?  Buprenorphine ?Namenda ?Verapamil ?Pindolol ?Isradapine ?Clonidine ?Lunesta-ineffective ?Belsomra- ineffective ?[place]- Legs "buckled up." ?Ambien ?Ambien CR-in effective ?Trazodone-nightmares ?Doxepin- ineffective ?Remeron ?  ? ?Review of Systems:  ?Review of Systems  ?Gastrointestinal:  Negative for nausea.  ?     Improved pain after surgery  ?Musculoskeletal:  Negative for gait problem.  ?Psychiatric/Behavioral:    ?     Please refer to HPI  ? ?Medications: I have reviewed the patient's current medications. ? ?Current Outpatient Medications  ?Medication Sig Dispense Refill  ? acetaminophen (TYLENOL) 325 MG tablet Take 2 tablets (650 mg total) by mouth every 6 (six) hours as needed. 36 tablet 0  ? ALPRAZolam (XANAX) 0.5 MG tablet Take 1 tablet (0.5 mg total) by mouth 4 (four) times daily as needed for anxiety. 120 tablet 2  ? atorvastatin (LIPITOR) 40 MG tablet Take 40 mg by mouth every evening.  0  ? b complex vitamins capsule Take 1 capsule by mouth daily.    ? cholecalciferol (VITAMIN D3) 25 MCG (1000 UNIT) tablet Take 3,000 Units by mouth daily.    ? divalproex (DEPAKOTE) 250 MG DR tablet Take 2 tablets (500 mg total) by mouth at bedtime. 180 tablet 0  ? donepezil (ARICEPT) 10 MG tablet Take 1 tablet (10 mg total) by mouth at bedtime. 90 tablet 0  ? gabapentin (NEURONTIN) 600 MG tablet Take 600 mg by mouth 2 (two) times daily.    ? insulin glargine (LANTUS SOLOSTAR) 100 UNIT/ML Solostar Pen ADMINISTER 64 UNITS UNDER THE SKIN AT BEDTIME 15 mL 2  ? insulin lispro (HUMALOG KWIKPEN) 100 UNIT/ML KwikPen 10 units before each meal 15 mL 0  ? levothyroxine (SYNTHROID) 75 MCG tablet TAKE 1 TABLET BY MOUTH EVERY DAY BEFORE BREAKFAST 90 tablet 1  ? liothyronine (CYTOMEL) 5 MCG tablet TAKE 1 TABLET BY MOUTH DAILY WITH LEVOTHYROXINE. TAKE BEFORE BREAKFAST 90 tablet 1  ? lithium carbonate (ESKALITH) 450 MG CR tablet Take 1 tablet (450 mg total) by mouth at bedtime. 90 tablet 0  ?  losartan (COZAAR) 25 MG tablet Take 25 mg by mouth daily.    ? memantine (NAMENDA) 10 MG tablet TAKE 1 TABLET(10 MG) BY MOUTH EVERY EVENING 90 tablet 0  ? oxyCODONE (ROXICODONE) 5 MG immediate release tablet Take 1 tablet (5 mg total) by mouth every 6 (six) hours as needed for severe pain. (Patient taking differently: Take 5 mg by mouth daily.) 10 tablet 0  ? QUEtiapine (SEROQUEL) 200 MG tablet TAKE 1 TAB PO q 5 pm and 1 tab po QHS 180 tablet 0  ? rOPINIRole (REQUIP) 0.5 MG tablet Takes 4 tabs at 5 pm and 3 tabs at bedtime and 1-2 tabs prn 720 tablet 0  ? BD PEN NEEDLE NANO 2ND GEN 32G X 4 MM MISC USE TO INJECT 5 TIMES DAILY AS DIRECTED 300 each 0  ? cefUROXime (CEFTIN) 250 MG tablet Take 1 tablet (250 mg total) by mouth 2 (two) times daily with a meal. (Patient not taking: Reported on 05/02/2021) 14 tablet 0  ? celecoxib (CELEBREX) 100 MG capsule Take 1 capsule (100 mg total) by mouth 2 (two) times daily. (Patient not taking: Reported on 03/28/2021) 30 capsule 0  ? ciprofloxacin (CIPRO) 500 MG tablet Take 500 mg by mouth 2 (two) times daily. (Patient not taking: Reported on 05/22/2021)    ? dicyclomine (BENTYL) 20 MG tablet Take 1 tablet (20 mg total) by mouth 2 (two) times daily as needed for spasms. (Patient not taking: Reported on 05/22/2021) 20  tablet 0  ? glucose blood (CONTOUR NEXT TEST) test strip USE TO TEST BLOOD SUGAR TWICE DAILY 100 strip 3  ? loperamide (IMODIUM A-D) 2 MG tablet Take 2 mg by mouth 2 (two) times daily as needed for diarrhea or loose stools. (Patient not taking: Reported on 05/22/2021)    ? metoCLOPramide (REGLAN) 5 MG tablet Take 1 tablet (5 mg total) by mouth every 6 (six) hours for 15 days. 60 tablet 0  ? metroNIDAZOLE (FLAGYL) 500 MG tablet Take 1 tablet (500 mg total) by mouth 2 (two) times daily. One po bid x 7 days (Patient not taking: Reported on 03/28/2021) 14 tablet 0  ? metroNIDAZOLE (FLAGYL) 500 MG tablet Take 1 tablet (500 mg total) by mouth 2 (two) times daily. (Patient not  taking: Reported on 05/22/2021) 14 tablet 0  ? Microlet Lancets MISC TEST DAILY AS DIRECTED 100 each 3  ? nitroGLYCERIN (NITROSTAT) 0.4 MG SL tablet Place 1 tablet (0.4 mg total) under the tongue every 5 (five) minutes as needed for chest pain. 25 tablet 2  ? ondansetron (ZOFRAN) 4 MG tablet Take 1 tablet (4 mg total) by mouth every 8

## 2021-05-26 DIAGNOSIS — E559 Vitamin D deficiency, unspecified: Secondary | ICD-10-CM | POA: Diagnosis not present

## 2021-05-26 DIAGNOSIS — K5730 Diverticulosis of large intestine without perforation or abscess without bleeding: Secondary | ICD-10-CM | POA: Diagnosis not present

## 2021-05-26 DIAGNOSIS — E1165 Type 2 diabetes mellitus with hyperglycemia: Secondary | ICD-10-CM | POA: Diagnosis not present

## 2021-05-26 DIAGNOSIS — R350 Frequency of micturition: Secondary | ICD-10-CM | POA: Diagnosis not present

## 2021-05-26 DIAGNOSIS — Z09 Encounter for follow-up examination after completed treatment for conditions other than malignant neoplasm: Secondary | ICD-10-CM | POA: Diagnosis not present

## 2021-05-27 ENCOUNTER — Other Ambulatory Visit: Payer: Medicare Other

## 2021-05-27 ENCOUNTER — Ambulatory Visit: Payer: Medicare Other | Admitting: Adult Health

## 2021-05-27 NOTE — Addendum Note (Signed)
Addended by: Sharyl Nimrod on: 05/27/2021 11:55 AM ? ? Modules accepted: Level of Service ? ?

## 2021-05-30 ENCOUNTER — Other Ambulatory Visit: Payer: Self-pay | Admitting: Endocrinology

## 2021-05-30 ENCOUNTER — Encounter: Payer: Self-pay | Admitting: Endocrinology

## 2021-05-30 ENCOUNTER — Ambulatory Visit (INDEPENDENT_AMBULATORY_CARE_PROVIDER_SITE_OTHER): Payer: Medicare Other | Admitting: Endocrinology

## 2021-05-30 ENCOUNTER — Other Ambulatory Visit: Payer: Self-pay

## 2021-05-30 VITALS — BP 122/76 | HR 89 | Ht 61.0 in | Wt 172.4 lb

## 2021-05-30 DIAGNOSIS — E1165 Type 2 diabetes mellitus with hyperglycemia: Secondary | ICD-10-CM | POA: Diagnosis not present

## 2021-05-30 DIAGNOSIS — Z794 Long term (current) use of insulin: Secondary | ICD-10-CM

## 2021-05-30 DIAGNOSIS — E039 Hypothyroidism, unspecified: Secondary | ICD-10-CM

## 2021-05-30 DIAGNOSIS — E782 Mixed hyperlipidemia: Secondary | ICD-10-CM

## 2021-05-30 NOTE — Progress Notes (Addendum)
Patient ID: [name] [name], female   DOB: 09/15/1945, 76 y.o.   MRN: [id number]  ? ?       ? ? ?Reason for Appointment: Follow-up  ? ? ?History of Present Illness:  ?        ?Date of diagnosis of type 2 diabetes mellitus: ?  2014       ? ?Background history:  ? ?She thinks her blood sugar was 400 at the time of diagnosis but no detailed records of this are available ?She did have an A1c of 10.6 done in 2014 and was probably given Lantus for some time initially ?Apparently she was treated with various medications including metformin, Janumet and Tradgenta. ?Her blood sugars had improved and A1c in 2015 was down to 6.2 ?She tends to have diarrhea with metformin and Janumet and also she thinks it causes dry mouth ?Most likely Janumet was stopped in 06/2015 ?Glipizide was started in 8/17 when blood sugars were higher and A1c was 9% ? ?Recent history:  ? ?INSULIN regimen is:   Lantus 64 units daily at 5 pm    ? ?Non-insulin hypoglycemic drugs the patient is taking are: 1 mg Ozempic weekly, off currently ? ?Her A1c was last 7.7, was 6.8 ? ? ?Current management, blood sugar patterns and problems identified: ? ?She has had much higher blood sugars over the last months especially after she was having diverticulitis earlier this month ?She had several readings over 200 early March  ?Because of this and her not being able to take Ozempic reportedly because of gastroparesis she has been started on Humalog ?She however may not take this if blood sugars are below 100 before meals  ?Overall blood sugar monitoring is somewhat sporadic but has some readings before every meal  ?Although her appetite has been variable she is now eating better and had hamburger last night  ?No hypoglycemia ?However her FASTING blood sugars are still at least over 140 ? ?Generally has lost weight because of acute illness in the last few weeks ? ?Side effects from medications have been:?  Diarrhea from metformin and Janumet ? ?Glucose monitoring:  done  1 time a day or less        Glucometer: Contour   ?     ?Blood Glucose readings  ? ?Patterns over the last 30 days ?Blood sugars are generally mildly increased overnight ?Occasionally fasting reading may be over 200 ?Overall blood sugars are averaging lowest before dinnertime ?Sporadic hyperglycemic episodes in the late afternoon and upper target readings late in the evening  ? ?PRE-MEAL Overnight Lunch Dinner Bedtime Overall  ?Glucose range: 145-176  87-176  87-268  ?Mean/median:     153  ? ?POST-MEAL PC Breakfast PC Lunch PC Dinner  ?Glucose range:  150-268 163, 180  ?Mean/median:     ? ?Previously: ? ?PRE-MEAL Fasting Lunch Dinner Bedtime Overall  ?Glucose range: 148, 149  116-230    ?Mean/median:     181  ? ?POST-MEAL PC Breakfast PC Lunch PC Dinner  ?Glucose range: 182  171, 236  ?Mean/median:     ? ?Typical meal intake: Breakfast at 9,  May be eggs and sausage and lunch is usually a sandwich   ?Dinner 7 pm              ? ?Dietician visit, most recent: 10/18 ?              ? ?Weight history:  ? ?Wt Readings from Last 3 Encounters:  ?05/30/21  172 lb 6.4 oz (78.2 kg)  ?04/13/21 175 lb (79.4 kg)  ?03/28/21 181 lb (82.1 kg)  ? ? ?Glycemic control: ?  ?Lab Results  ?Component Value Date  ? HGBA1C 7.7 (H) 03/25/2021  ? HGBA1C 6.8 (H) 12/24/2020  ? HGBA1C 6.6 (H) 10/08/2020  ? ?Lab Results  ?Component Value Date  ? MICROALBUR <0.7 08/09/2020  ? [place] 79 08/09/2020  ? CREATININE 0.79 04/13/2021  ? ?Lab Results  ?Component Value Date  ? MICRALBCREAT 1.1 08/09/2020  ? ? ? ? ? ?Allergies as of 05/30/2021   ? ?   Reactions  ? Codeine Anxiety, Other (See Comments)  ? Hallucinations, tolerates oxycodone   ? Procaine Hcl Palpitations  ? Aspirin Nausea And Vomiting, Other (See Comments)  ? Reaction:  Burns pts stomach   ? Augmentin [amoxicillin-pot Clavulanate] Diarrhea  ? Benadryl [diphenhydramine] Other (See Comments)  ? Per MD "inhibits potency of gabapentin, lithium etc"  ? Diflucan [fluconazole] Other (See Comments)  ?  Unknown reaction  ? Dilaudid [hydromorphone Hcl] Other (See Comments)  ? Migraines and nightmares   ? Hydromorphone Other (See Comments)  ? Other Nausea And Vomiting, Other (See Comments)  ? Pt states that all -mycins cause N/V. (MACROLIDES)   ? Prednisone   ? Long term steroid problems with lithium and blood sugar.   ? Tramadol Hcl   ? Other reaction(s): felt weird  ? Sulfa Antibiotics Other (See Comments)  ? Unknown reaction  ? ?  ? ?  ?Medication List  ?  ? ?  ? Accurate as of May 30, 2021 11:59 PM. If you have any questions, ask your nurse or doctor.  ?  ?  ? ?  ? ?STOP taking these medications   ? ?cefUROXime 250 MG tablet ?Commonly known as: CEFTIN ?Stopped by: Elayne Snare, MD ?  ?celecoxib 100 MG capsule ?Commonly known as: CeleBREX ?Stopped by: Elayne Snare, MD ?  ?ciprofloxacin 500 MG tablet ?Commonly known as: CIPRO ?Stopped by: Elayne Snare, MD ?  ?loperamide 2 MG tablet ?Commonly known as: IMODIUM A-D ?Stopped by: Elayne Snare, MD ?  ? ?  ? ?TAKE these medications   ? ?acetaminophen 325 MG tablet ?Commonly known as: Tylenol ?Take 2 tablets (650 mg total) by mouth every 6 (six) hours as needed. ?  ?ALPRAZolam 0.5 MG tablet ?Commonly known as: Duanne Moron ?Take 1 tablet (0.5 mg total) by mouth 4 (four) times daily as needed for anxiety. ?  ?atorvastatin 40 MG tablet ?Commonly known as: LIPITOR ?Take 40 mg by mouth every evening. ?  ?b complex vitamins capsule ?Take 1 capsule by mouth daily. ?  ?BD Pen Needle Nano 2nd Gen 32G X 4 MM Misc ?Generic drug: Insulin Pen Needle ?USE TO INJECT 5 TIMES DAILY AS DIRECTED ?  ?cholecalciferol 25 MCG (1000 UNIT) tablet ?Commonly known as: VITAMIN D3 ?Take 3,000 Units by mouth daily. ?  ?Contour Next Test test strip ?Generic drug: glucose blood ?USE TO TEST BLOOD SUGAR TWICE DAILY ?  ?dicyclomine 20 MG tablet ?Commonly known as: BENTYL ?Take 1 tablet (20 mg total) by mouth 2 (two) times daily as needed for spasms. ?  ?divalproex 250 MG DR tablet ?Commonly known as: DEPAKOTE ?Take 2  tablets (500 mg total) by mouth at bedtime. ?  ?donepezil 10 MG tablet ?Commonly known as: ARICEPT ?Take 1 tablet (10 mg total) by mouth at bedtime. ?  ?gabapentin 600 MG tablet ?Commonly known as: NEURONTIN ?Take 600 mg by mouth 2 (two) times daily. ?  ?  insulin lispro 100 UNIT/ML KwikPen ?Commonly known as: HumaLOG KwikPen ?ADMINISTER 10 UNITS UNDER THE SKIN BEFORE MEALS ?What changed: additional instructions ?Changed by: Elayne Snare, MD ?  ?Lantus SoloStar 100 UNIT/ML Solostar Pen ?Generic drug: insulin glargine ?ADMINISTER 64 UNITS UNDER THE SKIN AT BEDTIME ?  ?levothyroxine 75 MCG tablet ?Commonly known as: SYNTHROID ?TAKE 1 TABLET BY MOUTH EVERY DAY BEFORE BREAKFAST ?  ?liothyronine 5 MCG tablet ?Commonly known as: CYTOMEL ?TAKE 1 TABLET BY MOUTH DAILY WITH LEVOTHYROXINE. TAKE BEFORE BREAKFAST ?  ?lithium carbonate 450 MG CR tablet ?Commonly known as: ESKALITH ?Take 1 tablet (450 mg total) by mouth at bedtime. ?  ?losartan 25 MG tablet ?Commonly known as: COZAAR ?Take 25 mg by mouth daily. ?  ?memantine 10 MG tablet ?Commonly known as: NAMENDA ?TAKE 1 TABLET(10 MG) BY MOUTH EVERY EVENING ?  ?metoCLOPramide 5 MG tablet ?Commonly known as: REGLAN ?Take 1 tablet (5 mg total) by mouth every 6 (six) hours for 15 days. ?  ?metroNIDAZOLE 500 MG tablet ?Commonly known as: Flagyl ?Take 1 tablet (500 mg total) by mouth 2 (two) times daily. One po bid x 7 days ?  ?metroNIDAZOLE 500 MG tablet ?Commonly known as: FLAGYL ?Take 1 tablet (500 mg total) by mouth 2 (two) times daily. ?  ?Microlet Lancets Misc ?TEST DAILY AS DIRECTED ?  ?nitroGLYCERIN 0.4 MG SL tablet ?Commonly known as: NITROSTAT ?Place 1 tablet (0.4 mg total) under the tongue every 5 (five) minutes as needed for chest pain. ?  ?ondansetron 4 MG tablet ?Commonly known as: ZOFRAN ?Take 1 tablet (4 mg total) by mouth every 8 (eight) hours as needed for nausea or vomiting. ?  ?oxyCODONE 5 MG immediate release tablet ?Commonly known as: Roxicodone ?Take 1 tablet (5  mg total) by mouth every 6 (six) hours as needed for severe pain. ?What changed: when to take this ?  ?oxyCODONE 5 MG immediate release tablet ?Commonly known as: Roxicodone ?Take 1-2 tablets (5-10 mg

## 2021-05-30 NOTE — Patient Instructions (Signed)
Lantus 66-68 and keep sugar <130 in am ? ?Adjust 5-10 Humalog based on meal size ? ?Check blood sugars on waking up 4 days a week ? ?Also check blood sugars about 2 hours after meals and do this after different meals by rotation ? ?Recommended blood sugar levels on waking up are 90-130 and about 2 hours after meal is 130-180 ? ?Please bring your blood sugar monitor to each visit, thank you ?' ?

## 2021-06-03 ENCOUNTER — Emergency Department (HOSPITAL_BASED_OUTPATIENT_CLINIC_OR_DEPARTMENT_OTHER)
Admission: EM | Admit: 2021-06-03 | Discharge: 2021-06-03 | Disposition: A | Discharge status: Home / Self Care | Payer: Medicare Other | Attending: Emergency Medicine | Admitting: Emergency Medicine

## 2021-06-03 ENCOUNTER — Emergency Department (HOSPITAL_BASED_OUTPATIENT_CLINIC_OR_DEPARTMENT_OTHER): Payer: Medicare Other

## 2021-06-03 ENCOUNTER — Other Ambulatory Visit: Payer: Self-pay

## 2021-06-03 ENCOUNTER — Encounter (HOSPITAL_BASED_OUTPATIENT_CLINIC_OR_DEPARTMENT_OTHER): Payer: Self-pay | Admitting: *Deleted

## 2021-06-03 DIAGNOSIS — K5730 Diverticulosis of large intestine without perforation or abscess without bleeding: Secondary | ICD-10-CM | POA: Diagnosis not present

## 2021-06-03 DIAGNOSIS — Z96641 Presence of right artificial hip joint: Secondary | ICD-10-CM | POA: Diagnosis not present

## 2021-06-03 DIAGNOSIS — Z96651 Presence of right artificial knee joint: Secondary | ICD-10-CM | POA: Insufficient documentation

## 2021-06-03 DIAGNOSIS — E039 Hypothyroidism, unspecified: Secondary | ICD-10-CM | POA: Diagnosis not present

## 2021-06-03 DIAGNOSIS — N200 Calculus of kidney: Secondary | ICD-10-CM | POA: Diagnosis not present

## 2021-06-03 DIAGNOSIS — N3000 Acute cystitis without hematuria: Secondary | ICD-10-CM | POA: Insufficient documentation

## 2021-06-03 DIAGNOSIS — E119 Type 2 diabetes mellitus without complications: Secondary | ICD-10-CM | POA: Insufficient documentation

## 2021-06-03 DIAGNOSIS — I10 Essential (primary) hypertension: Secondary | ICD-10-CM | POA: Insufficient documentation

## 2021-06-03 DIAGNOSIS — N390 Urinary tract infection, site not specified: Secondary | ICD-10-CM | POA: Diagnosis present

## 2021-06-03 LAB — URINALYSIS, ROUTINE W REFLEX MICROSCOPIC
Bilirubin Urine: NEGATIVE
Glucose, UA: NEGATIVE mg/dL
Ketones, ur: NEGATIVE mg/dL
Nitrite: POSITIVE — AB
Specific Gravity, Urine: 1.01 (ref 1.005–1.030)
WBC, UA: >50 WBC/hpf — ABNORMAL HIGH (ref 0–5)
pH: 7 (ref 5.0–8.0)

## 2021-06-03 LAB — COMPREHENSIVE METABOLIC PANEL
ALT: 12 U/L (ref 0–44)
AST: 13 U/L — ABNORMAL LOW (ref 15–41)
Albumin: 3.9 g/dL (ref 3.5–5.0)
Alkaline Phosphatase: 75 U/L (ref 38–126)
Anion gap: 10 (ref 5–15)
BUN: 15 mg/dL (ref 8–23)
CO2: 24 mmol/L (ref 22–32)
Calcium: 10.2 mg/dL (ref 8.9–10.3)
Chloride: 106 mmol/L (ref 98–111)
Creatinine, Ser: 0.8 mg/dL (ref 0.44–1.00)
GFR, Estimated: >60 mL/min (ref >60)
Glucose, Bld: 147 mg/dL — ABNORMAL HIGH (ref 70–99)
Potassium: 4.1 mmol/L (ref 3.5–5.1)
Sodium: 140 mmol/L (ref 135–145)
Total Bilirubin: 0.4 mg/dL (ref 0.3–1.2)
Total Protein: 6.6 g/dL (ref 6.5–8.1)

## 2021-06-03 LAB — CBC
HCT: 41.9 % (ref 36.0–46.0)
Hemoglobin: 12.9 g/dL (ref 12.0–15.0)
MCH: 28.4 pg (ref 26.0–34.0)
MCHC: 30.8 g/dL (ref 30.0–36.0)
MCV: 92.3 fL (ref 80.0–100.0)
Platelets: 220 10*3/uL (ref 150–400)
RBC: 4.54 MIL/uL (ref 3.87–5.11)
RDW: 14 % (ref 11.5–15.5)
WBC: 9 10*3/uL (ref 4.0–10.5)
nRBC: 0 % (ref 0.0–0.2)

## 2021-06-03 LAB — LIPASE, BLOOD: Lipase: 49 U/L (ref 11–51)

## 2021-06-03 MED ORDER — ONDANSETRON HCL 4 MG/2ML IJ SOLN
4.0000 mg | Freq: Once | INTRAMUSCULAR | Status: AC
  Administered 2021-06-03: 4 mg via INTRAVENOUS
  Filled 2021-06-03: qty 2

## 2021-06-03 MED ORDER — CEFTRIAXONE SODIUM 1 G IJ SOLR
1.0000 g | Freq: Once | INTRAMUSCULAR | Status: AC
  Administered 2021-06-03: 1 g via INTRAVENOUS
  Filled 2021-06-03: qty 10

## 2021-06-03 MED ORDER — SODIUM CHLORIDE 0.9 % IV BOLUS
1000.0000 mL | Freq: Once | INTRAVENOUS | Status: AC
  Administered 2021-06-03: 1000 mL via INTRAVENOUS

## 2021-06-03 MED ORDER — IOHEXOL 350 MG/ML SOLN
100.0000 mL | Freq: Once | INTRAVENOUS | Status: AC | PRN
  Administered 2021-06-03: 80 mL via INTRAVENOUS

## 2021-06-03 MED ORDER — FENTANYL CITRATE PF 50 MCG/ML IJ SOSY
50.0000 ug | PREFILLED_SYRINGE | Freq: Once | INTRAMUSCULAR | Status: AC
  Filled 2021-06-03: qty 1

## 2021-06-03 MED ORDER — FENTANYL CITRATE PF 50 MCG/ML IJ SOSY
PREFILLED_SYRINGE | INTRAMUSCULAR | Status: AC
  Administered 2021-06-03: 50 ug via INTRAVENOUS
  Filled 2021-06-03: qty 1

## 2021-06-03 NOTE — ED Triage Notes (Signed)
Pt states right lower back pain and lower abdominal pain. Pt reports UTI symptoms for about 2 weeks, her doctor prescribed keflex yesterday, she has had 1 tablet thus far.  ?

## 2021-06-03 NOTE — Discharge Instructions (Signed)
You were evaluated in the Emergency Department and after careful evaluation, we did not find any emergent condition requiring admission or further testing in the hospital. ? ?Your exam/testing today is overall reassuring.  Symptoms seem explained by urinary tract infection.  Please restart the Keflex antibiotic Wednesday morning.  Use Tylenol at home for discomfort. ? ?Please return to the Emergency Department if you experience any worsening of your condition.   Thank you for allowing [place] to be a part of your care. ?

## 2021-06-03 NOTE — ED Notes (Signed)
Pt verbalizes understanding of discharge instructions. Opportunity for questioning and answers were provided. Pt discharged from ED to home.   ? ?
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
## 2021-06-12 DIAGNOSIS — N399 Disorder of urinary system, unspecified: Secondary | ICD-10-CM | POA: Diagnosis not present

## 2021-06-12 DIAGNOSIS — R3989 Other symptoms and signs involving the genitourinary system: Secondary | ICD-10-CM | POA: Diagnosis not present

## 2021-06-13 ENCOUNTER — Encounter (HOSPITAL_BASED_OUTPATIENT_CLINIC_OR_DEPARTMENT_OTHER): Payer: Self-pay

## 2021-06-13 ENCOUNTER — Other Ambulatory Visit: Payer: Self-pay

## 2021-06-13 ENCOUNTER — Inpatient Hospital Stay (HOSPITAL_BASED_OUTPATIENT_CLINIC_OR_DEPARTMENT_OTHER)
Admission: EM | Admit: 2021-06-13 | Discharge: 2021-06-17 | DRG: 373 | Disposition: A | Discharge status: Home / Self Care | Payer: Medicare Other | Attending: Internal Medicine | Admitting: Internal Medicine

## 2021-06-13 ENCOUNTER — Other Ambulatory Visit (HOSPITAL_BASED_OUTPATIENT_CLINIC_OR_DEPARTMENT_OTHER): Payer: Self-pay

## 2021-06-13 ENCOUNTER — Emergency Department (HOSPITAL_BASED_OUTPATIENT_CLINIC_OR_DEPARTMENT_OTHER): Payer: Medicare Other

## 2021-06-13 DIAGNOSIS — G2581 Restless legs syndrome: Secondary | ICD-10-CM | POA: Diagnosis present

## 2021-06-13 DIAGNOSIS — L932 Other local lupus erythematosus: Secondary | ICD-10-CM | POA: Diagnosis present

## 2021-06-13 DIAGNOSIS — Z888 Allergy status to other drugs, medicaments and biological substances status: Secondary | ICD-10-CM

## 2021-06-13 DIAGNOSIS — Z794 Long term (current) use of insulin: Secondary | ICD-10-CM | POA: Diagnosis not present

## 2021-06-13 DIAGNOSIS — E1143 Type 2 diabetes mellitus with diabetic autonomic (poly)neuropathy: Secondary | ICD-10-CM | POA: Diagnosis not present

## 2021-06-13 DIAGNOSIS — H9193 Unspecified hearing loss, bilateral: Secondary | ICD-10-CM | POA: Diagnosis present

## 2021-06-13 DIAGNOSIS — Z885 Allergy status to narcotic agent status: Secondary | ICD-10-CM

## 2021-06-13 DIAGNOSIS — E785 Hyperlipidemia, unspecified: Secondary | ICD-10-CM | POA: Diagnosis present

## 2021-06-13 DIAGNOSIS — K3184 Gastroparesis: Secondary | ICD-10-CM | POA: Diagnosis present

## 2021-06-13 DIAGNOSIS — Z8249 Family history of ischemic heart disease and other diseases of the circulatory system: Secondary | ICD-10-CM

## 2021-06-13 DIAGNOSIS — E1140 Type 2 diabetes mellitus with diabetic neuropathy, unspecified: Secondary | ICD-10-CM | POA: Diagnosis present

## 2021-06-13 DIAGNOSIS — M19042 Primary osteoarthritis, left hand: Secondary | ICD-10-CM | POA: Diagnosis present

## 2021-06-13 DIAGNOSIS — F419 Anxiety disorder, unspecified: Secondary | ICD-10-CM | POA: Diagnosis present

## 2021-06-13 DIAGNOSIS — K760 Fatty (change of) liver, not elsewhere classified: Secondary | ICD-10-CM | POA: Diagnosis not present

## 2021-06-13 DIAGNOSIS — E039 Hypothyroidism, unspecified: Secondary | ICD-10-CM | POA: Diagnosis present

## 2021-06-13 DIAGNOSIS — Z79899 Other long term (current) drug therapy: Secondary | ICD-10-CM | POA: Diagnosis not present

## 2021-06-13 DIAGNOSIS — E1159 Type 2 diabetes mellitus with other circulatory complications: Secondary | ICD-10-CM | POA: Diagnosis present

## 2021-06-13 DIAGNOSIS — M797 Fibromyalgia: Secondary | ICD-10-CM | POA: Diagnosis present

## 2021-06-13 DIAGNOSIS — Z79890 Hormone replacement therapy (postmenopausal): Secondary | ICD-10-CM

## 2021-06-13 DIAGNOSIS — I10 Essential (primary) hypertension: Secondary | ICD-10-CM | POA: Diagnosis present

## 2021-06-13 DIAGNOSIS — A0471 Enterocolitis due to Clostridium difficile, recurrent: Principal | ICD-10-CM | POA: Diagnosis present

## 2021-06-13 DIAGNOSIS — Z9049 Acquired absence of other specified parts of digestive tract: Secondary | ICD-10-CM | POA: Diagnosis not present

## 2021-06-13 DIAGNOSIS — N200 Calculus of kidney: Secondary | ICD-10-CM | POA: Diagnosis not present

## 2021-06-13 DIAGNOSIS — R197 Diarrhea, unspecified: Secondary | ICD-10-CM | POA: Diagnosis not present

## 2021-06-13 DIAGNOSIS — E669 Obesity, unspecified: Secondary | ICD-10-CM | POA: Diagnosis present

## 2021-06-13 DIAGNOSIS — E860 Dehydration: Secondary | ICD-10-CM | POA: Diagnosis present

## 2021-06-13 DIAGNOSIS — R1084 Generalized abdominal pain: Secondary | ICD-10-CM | POA: Diagnosis not present

## 2021-06-13 DIAGNOSIS — A0472 Enterocolitis due to Clostridium difficile, not specified as recurrent: Secondary | ICD-10-CM

## 2021-06-13 DIAGNOSIS — Z882 Allergy status to sulfonamides status: Secondary | ICD-10-CM

## 2021-06-13 DIAGNOSIS — Z886 Allergy status to analgesic agent status: Secondary | ICD-10-CM | POA: Diagnosis not present

## 2021-06-13 DIAGNOSIS — M19041 Primary osteoarthritis, right hand: Secondary | ICD-10-CM | POA: Diagnosis present

## 2021-06-13 DIAGNOSIS — F319 Bipolar disorder, unspecified: Secondary | ICD-10-CM | POA: Diagnosis present

## 2021-06-13 DIAGNOSIS — M5430 Sciatica, unspecified side: Secondary | ICD-10-CM | POA: Diagnosis present

## 2021-06-13 DIAGNOSIS — Z880 Allergy status to penicillin: Secondary | ICD-10-CM

## 2021-06-13 DIAGNOSIS — Z87440 Personal history of urinary (tract) infections: Secondary | ICD-10-CM

## 2021-06-13 DIAGNOSIS — R1030 Lower abdominal pain, unspecified: Secondary | ICD-10-CM | POA: Diagnosis not present

## 2021-06-13 LAB — CBC
HCT: 49.9 % — ABNORMAL HIGH (ref 36.0–46.0)
Hemoglobin: 15.3 g/dL — ABNORMAL HIGH (ref 12.0–15.0)
MCH: 28.4 pg (ref 26.0–34.0)
MCHC: 30.7 g/dL (ref 30.0–36.0)
MCV: 92.8 fL (ref 80.0–100.0)
Platelets: 197 10*3/uL (ref 150–400)
RBC: 5.38 MIL/uL — ABNORMAL HIGH (ref 3.87–5.11)
RDW: 14.6 % (ref 11.5–15.5)
WBC: 12.7 10*3/uL — ABNORMAL HIGH (ref 4.0–10.5)
nRBC: 0 % (ref 0.0–0.2)

## 2021-06-13 LAB — COMPREHENSIVE METABOLIC PANEL
ALT: 15 U/L (ref 0–44)
AST: 15 U/L (ref 15–41)
Albumin: 4.3 g/dL (ref 3.5–5.0)
Alkaline Phosphatase: 86 U/L (ref 38–126)
Anion gap: 12 (ref 5–15)
BUN: 14 mg/dL (ref 8–23)
CO2: 22 mmol/L (ref 22–32)
Calcium: 10.2 mg/dL (ref 8.9–10.3)
Chloride: 105 mmol/L (ref 98–111)
Creatinine, Ser: 0.87 mg/dL (ref 0.44–1.00)
GFR, Estimated: >60 mL/min (ref >60)
Glucose, Bld: 178 mg/dL — ABNORMAL HIGH (ref 70–99)
Potassium: 4.8 mmol/L (ref 3.5–5.1)
Sodium: 139 mmol/L (ref 135–145)
Total Bilirubin: 0.7 mg/dL (ref 0.3–1.2)
Total Protein: 7.1 g/dL (ref 6.5–8.1)

## 2021-06-13 LAB — C DIFFICILE QUICK SCREEN W PCR REFLEX
C Diff antigen: POSITIVE — AB
C Diff toxin: NEGATIVE

## 2021-06-13 LAB — GLUCOSE, CAPILLARY: Glucose-Capillary: 174 mg/dL — ABNORMAL HIGH (ref 70–99)

## 2021-06-13 LAB — CLOSTRIDIUM DIFFICILE BY PCR, REFLEXED: Toxigenic C. Difficile by PCR: POSITIVE — AB

## 2021-06-13 LAB — LIPASE, BLOOD: Lipase: 21 U/L (ref 11–51)

## 2021-06-13 MED ORDER — SODIUM CHLORIDE 0.9 % IV BOLUS
500.0000 mL | Freq: Once | INTRAVENOUS | Status: AC
  Administered 2021-06-13: 500 mL via INTRAVENOUS

## 2021-06-13 MED ORDER — DICYCLOMINE HCL 10 MG/ML IM SOLN
20.0000 mg | Freq: Once | INTRAMUSCULAR | Status: AC
  Administered 2021-06-13: 20 mg via INTRAMUSCULAR
  Filled 2021-06-13: qty 2

## 2021-06-13 MED ORDER — FENTANYL CITRATE PF 50 MCG/ML IJ SOSY
50.0000 ug | PREFILLED_SYRINGE | Freq: Once | INTRAMUSCULAR | Status: AC
  Administered 2021-06-13: 50 ug via INTRAVENOUS
  Filled 2021-06-13: qty 1

## 2021-06-13 MED ORDER — POTASSIUM CHLORIDE 10 MEQ/100ML IV SOLN: 10.0000 meq | INTRAVENOUS | Status: DC

## 2021-06-13 MED ORDER — ONDANSETRON HCL 4 MG/2ML IJ SOLN
4.0000 mg | Freq: Once | INTRAMUSCULAR | Status: AC
  Administered 2021-06-13: 4 mg via INTRAVENOUS
  Filled 2021-06-13: qty 2

## 2021-06-13 MED ORDER — OXYCODONE-ACETAMINOPHEN 5-325 MG PO TABS
1.0000 | ORAL_TABLET | Freq: Four times a day (QID) | ORAL | 0 refills | Status: DC | PRN
  Filled 2021-06-13: qty 15, 4d supply, fill #0

## 2021-06-13 MED ORDER — VANCOMYCIN HCL 125 MG PO CAPS
125.0000 mg | ORAL_CAPSULE | Freq: Four times a day (QID) | ORAL | 0 refills | Status: DC
  Filled 2021-06-13: qty 40, 10d supply, fill #0

## 2021-06-13 MED ORDER — VANCOMYCIN HCL 125 MG PO CAPS
125.0000 mg | ORAL_CAPSULE | Freq: Four times a day (QID) | ORAL | Status: DC
  Administered 2021-06-13 – 2021-06-15 (×6): 125 mg via ORAL
  Filled 2021-06-13 (×7): qty 1

## 2021-06-13 MED ORDER — SODIUM CHLORIDE 0.9 % IV SOLN: INTRAVENOUS | Status: DC

## 2021-06-13 NOTE — Progress Notes (Signed)
Patient came in with severe watery diarrhea, C. difficile toxin positive.  Severe dehydrated and generalized weakness.  On IV fluids and p.o. vancomycin, likely can go home tomorrow if symptoms improve. ?

## 2021-06-13 NOTE — ED Provider Notes (Signed)
Patient still struggling with lots of watery diarrhea.  Also still having some crampy abdominal pain.  Patient's pain medications limited to fentanyl due to her allergy profile.  But since she is still having a lot of watery diarrhea she will get dehydrated.  So contacted hospitalist for admission.  We will give a dose of oral vancomycin now. ?  ?Fredia Sorrow, MD ?06/13/21 1828 ? ?

## 2021-06-13 NOTE — ED Provider Notes (Signed)
?[facility] EMERGENCY DEPT ?Provider Note ? ? ?CSN: [id number] ?Arrival date & time: 06/13/21  0956 ? ?  ? ?History ? ?Chief Complaint  ?Patient presents with  ? Abdominal Pain  ? Nausea  ? Emesis  ? ? ?ROISE EMERT is a 76 y.o. female. ? ?Patient is a 76 year old female who presents with abdominal pain.  Per chart review, she has a history of a robotic sigmoidectomy on March 8 for diverticulitis.  This was done in [facility] facility.  She said she had been doing well.  Last night she started having pain all over her abdomen.  She has a little bit of abdominal distention.  She has associated nausea vomiting and some watery stools have been going on for about 3 days.  She does say she has had a history of prior C. difficile.  She denies any fevers.  No urinary symptoms.  Her emesis is nonbloody and nonbilious.  Her stool is nonbloody. ? ? ?  ? ?Home Medications ?Prior to Admission medications   ?Medication Sig Start Date End Date Taking? Authorizing Provider  ?oxyCODONE-acetaminophen (PERCOCET/ROXICET) 5-325 MG tablet Take 1 tablet by mouth every 6 (six) hours as needed for severe pain. 06/13/21  Yes Malvin Johns, MD  ?vancomycin (VANCOCIN) 125 MG capsule Take 1 capsule (125 mg total) by mouth 4 (four) times daily. 06/13/21  Yes Malvin Johns, MD  ?acetaminophen (TYLENOL) 325 MG tablet Take 2 tablets (650 mg total) by mouth every 6 (six) hours as needed. 02/11/21   Jeanell Sparrow, DO  ?ALPRAZolam (XANAX) 0.5 MG tablet Take 1 tablet (0.5 mg total) by mouth 4 (four) times daily as needed for anxiety. 02/19/21   [name], [place]  ?atorvastatin (LIPITOR) 40 MG tablet Take 40 mg by mouth every evening.    [provider]  ?b complex vitamins capsule Take 1 capsule by mouth daily.    [provider]  ?BD PEN NEEDLE NANO 2ND GEN 32G X 4 MM MISC USE TO INJECT 5 TIMES DAILY AS DIRECTED 04/28/21   Elayne Snare, MD  ?cholecalciferol (VITAMIN D3) 25 MCG (1000 UNIT) tablet Take 3,000 Units by  mouth daily.    [provider]  ?dicyclomine (BENTYL) 20 MG tablet Take 1 tablet (20 mg total) by mouth 2 (two) times daily as needed for spasms. ?Patient not taking: Reported on 05/22/2021 02/15/21   Sherwood Gambler, MD  ?divalproex (DEPAKOTE) 250 MG DR tablet Take 2 tablets (500 mg total) by mouth at bedtime. 05/02/21   [name], PMHNP  ?donepezil (ARICEPT) 10 MG tablet Take 1 tablet (10 mg total) by mouth at bedtime. 05/02/21   [name], PMHNP  ?gabapentin (NEURONTIN) 600 MG tablet Take 600 mg by mouth 2 (two) times daily. 05/12/19   [provider]  ?glucose blood (CONTOUR NEXT TEST) test strip USE TO TEST BLOOD SUGAR TWICE DAILY 06/20/20   Elayne Snare, MD  ?insulin glargine (LANTUS SOLOSTAR) 100 UNIT/ML Solostar Pen ADMINISTER 64 UNITS UNDER THE SKIN AT BEDTIME 05/06/21   Elayne Snare, MD  ?insulin lispro (HUMALOG KWIKPEN) 100 UNIT/ML KwikPen ADMINISTER 10 UNITS UNDER THE SKIN BEFORE MEALS 05/30/21   Elayne Snare, MD  ?levothyroxine (SYNTHROID) 75 MCG tablet TAKE 1 TABLET BY MOUTH EVERY DAY BEFORE BREAKFAST 03/27/21   Elayne Snare, MD  ?liothyronine (CYTOMEL) 5 MCG tablet TAKE 1 TABLET BY MOUTH DAILY WITH LEVOTHYROXINE. TAKE BEFORE BREAKFAST 03/27/21   Elayne Snare, MD  ?lithium carbonate (ESKALITH) 450 MG CR tablet Take 1 tablet (450 mg total)  by mouth at bedtime. 05/02/21   [name], PMHNP  ?losartan (COZAAR) 25 MG tablet Take 25 mg by mouth daily. 08/31/19   [provider]  ?memantine (NAMENDA) 10 MG tablet TAKE 1 TABLET(10 MG) BY MOUTH EVERY EVENING 05/02/21   [name], PMHNP  ?metoCLOPramide (REGLAN) 5 MG tablet Take 1 tablet (5 mg total) by mouth every 6 (six) hours for 15 days. 04/13/21 04/28/21  Tonye Pearson, PA-C  ?metroNIDAZOLE (FLAGYL) 500 MG tablet Take 1 tablet (500 mg total) by mouth 2 (two) times daily. One po bid x 7 days ?Patient not taking: Reported on 03/28/2021 02/15/21   Sherwood Gambler, MD  ?metroNIDAZOLE (FLAGYL) 500 MG tablet Take 1 tablet (500 mg  total) by mouth 2 (two) times daily. ?Patient not taking: Reported on 05/22/2021 03/31/21   Lacretia Leigh, MD  ?Microlet Lancets MISC TEST DAILY AS DIRECTED 06/20/20   Elayne Snare, MD  ?nitroGLYCERIN (NITROSTAT) 0.4 MG SL tablet Place 1 tablet (0.4 mg total) under the tongue every 5 (five) minutes as needed for chest pain. 09/14/17   Lyda Jester M, PA-C  ?ondansetron (ZOFRAN) 4 MG tablet Take 1 tablet (4 mg total) by mouth every 8 (eight) hours as needed for nausea or vomiting. ?Patient not taking: Reported on 05/22/2021 02/15/21   Sherwood Gambler, MD  ?oxyCODONE (ROXICODONE) 5 MG immediate release tablet Take 1 tablet (5 mg total) by mouth every 6 (six) hours as needed for severe pain. ?Patient taking differently: Take 5 mg by mouth daily. 02/15/21   Sherwood Gambler, MD  ?oxyCODONE (ROXICODONE) 5 MG immediate release tablet Take 1-2 tablets (5-10 mg total) by mouth every 4 (four) hours as needed for severe pain. ?Patient not taking: Reported on 03/10/5850 77/82/42   Delora Fuel, MD  ?Probiotic Product (PROBIOTIC ADVANCED PO) Take by mouth 2 (two) times daily. ?Patient not taking: Reported on 05/22/2021    [provider]  ?promethazine (PHENERGAN) 12.5 MG tablet Take 1 tablet (12.5 mg total) by mouth every 6 (six) hours as needed for nausea or vomiting. 04/13/21   Tonye Pearson, PA-C  ?QUEtiapine (SEROQUEL) 200 MG tablet TAKE 1 TAB PO q 5 pm and 1 tab po QHS 05/02/21   [name], PMHNP  ?rOPINIRole (REQUIP) 0.5 MG tablet Takes 4 tabs at 5 pm and 3 tabs at bedtime and 1-2 tabs prn 05/02/21   [name], PMHNP  ?sucralfate (CARAFATE) 1 g tablet Take 1 tablet (1 g total) by mouth 4 (four) times daily -  with meals and at bedtime for 7 days. 03/31/21 04/07/21  Jeanell Sparrow, DO  ?tiZANidine (ZANAFLEX) 4 MG tablet Take 1 tablet (4 mg total) by mouth every 6 (six) hours as needed for muscle spasms. ?Patient not taking: Reported on 3/53/6144 31/54/00   Delora Fuel, MD  ?   ? ?Allergies    ?Codeine,  Procaine hcl, Aspirin, Augmentin [amoxicillin-pot clavulanate], Benadryl [diphenhydramine], Diflucan [fluconazole], Dilaudid [hydromorphone hcl], Hydromorphone, Other, Prednisone, Tramadol hcl, and Sulfa antibiotics   ? ?Review of Systems   ?Review of Systems  ?Constitutional:  Negative for chills, diaphoresis, fatigue and fever.  ?HENT:  Negative for congestion, rhinorrhea and sneezing.   ?Eyes: Negative.   ?Respiratory:  Negative for cough, chest tightness and shortness of breath.   ?Cardiovascular:  Negative for chest pain and leg swelling.  ?Gastrointestinal:  Positive for abdominal pain, diarrhea, nausea and vomiting. Negative for blood in stool.  ?Genitourinary:  Negative for difficulty urinating, flank pain, frequency and hematuria.  ?Musculoskeletal:  Negative for arthralgias and back pain.  ?Skin:  Negative for rash.  ?Neurological:  Negative for dizziness, speech difficulty, weakness, numbness and headaches.  ? ?Physical Exam ?Updated Vital Signs ?BP 130/64   Pulse (!) 109   Temp 98.1 ?F (36.7 ?C) (Oral)   Resp 16   Ht '5\' 1"'$  (1.549 m)   Wt 78 kg   SpO2 96%   BMI 32.50 kg/m?  ?Physical Exam ?Constitutional:   ?   Appearance: She is well-developed.  ?HENT:  ?   Head: Normocephalic and atraumatic.  ?Eyes:  ?   Pupils: Pupils are equal, round, and reactive to light.  ?Cardiovascular:  ?   Rate and Rhythm: Normal rate and regular rhythm.  ?   Heart sounds: Normal heart sounds.  ?Pulmonary:  ?   Effort: Pulmonary effort is normal. No respiratory distress.  ?   Breath sounds: Normal breath sounds. No wheezing or rales.  ?Chest:  ?   Chest wall: No tenderness.  ?Abdominal:  ?   General: Bowel sounds are decreased.  ?   Palpations: Abdomen is soft.  ?   Tenderness: There is generalized abdominal tenderness. There is no guarding or rebound.  ?Musculoskeletal:     ?   General: Normal range of motion.  ?   Cervical back: Normal range of motion and neck supple.  ?Lymphadenopathy:  ?   Cervical: No cervical  adenopathy.  ?Skin: ?   General: Skin is warm and dry.  ?   Findings: No rash.  ?Neurological:  ?   Mental Status: She is alert and oriented to person, place, and time.  ? ? ?ED Results / Procedures / Treatments   ?Lab

## 2021-06-13 NOTE — ED Notes (Addendum)
Pt's monitor began alarming, sats were reading 87-88% RA with a good pleth. Upon entry to room, pt was asleep and mouth breathing. Pt had just received 38mg fentanyl IVP for abd pain. Pt easily aroused by simple verbal interaction. Pt immediately opened her eyes when I called her name.  ?Pt placed on 1.5L [place], sats increased to 93%  ?Belfi made aware  ?This RN asked pt if she ever wears oxygen at home, pt states, "only when I come in here they have to give me oxygen"  ?

## 2021-06-13 NOTE — ED Triage Notes (Signed)
First contact with patient. Patient arrived via triage with complaints of abd pain v/d x this am. Patient states stools are liquid x 3 days. Patient had a recent colon resection x 2 weeks ago. Pt is A&OX 4. Respirations even/unlabored. Patient changed into gown and placed on monitor and call light within reach.  ? ?

## 2021-06-13 NOTE — ED Notes (Signed)
Pt complaining of pain, asking for pain medication. CRN made aware.  ?

## 2021-06-13 NOTE — ED Notes (Signed)
Called [facility] to transport patient to [facility] 6N room # 20 ?

## 2021-06-13 NOTE — ED Notes (Signed)
Patient in content of large amount of liquid stool.  States not able to get up quick enough before diarrhea runs out.  Patient changed and bed bath given.  Peri care provide.  Patient given depends to wear ?

## 2021-06-14 ENCOUNTER — Encounter (HOSPITAL_COMMUNITY): Payer: Self-pay | Admitting: Internal Medicine

## 2021-06-14 DIAGNOSIS — F319 Bipolar disorder, unspecified: Secondary | ICD-10-CM | POA: Diagnosis present

## 2021-06-14 DIAGNOSIS — Z794 Long term (current) use of insulin: Secondary | ICD-10-CM | POA: Diagnosis not present

## 2021-06-14 DIAGNOSIS — M19041 Primary osteoarthritis, right hand: Secondary | ICD-10-CM | POA: Diagnosis present

## 2021-06-14 DIAGNOSIS — M19042 Primary osteoarthritis, left hand: Secondary | ICD-10-CM | POA: Diagnosis present

## 2021-06-14 DIAGNOSIS — A0472 Enterocolitis due to Clostridium difficile, not specified as recurrent: Secondary | ICD-10-CM

## 2021-06-14 DIAGNOSIS — G2581 Restless legs syndrome: Secondary | ICD-10-CM | POA: Diagnosis present

## 2021-06-14 DIAGNOSIS — A0471 Enterocolitis due to Clostridium difficile, recurrent: Secondary | ICD-10-CM | POA: Diagnosis present

## 2021-06-14 DIAGNOSIS — Z79890 Hormone replacement therapy (postmenopausal): Secondary | ICD-10-CM | POA: Diagnosis not present

## 2021-06-14 DIAGNOSIS — Z79899 Other long term (current) drug therapy: Secondary | ICD-10-CM | POA: Diagnosis not present

## 2021-06-14 DIAGNOSIS — L932 Other local lupus erythematosus: Secondary | ICD-10-CM | POA: Diagnosis present

## 2021-06-14 DIAGNOSIS — Z9049 Acquired absence of other specified parts of digestive tract: Secondary | ICD-10-CM | POA: Diagnosis not present

## 2021-06-14 DIAGNOSIS — K3184 Gastroparesis: Secondary | ICD-10-CM | POA: Diagnosis present

## 2021-06-14 DIAGNOSIS — M797 Fibromyalgia: Secondary | ICD-10-CM | POA: Diagnosis present

## 2021-06-14 DIAGNOSIS — E1143 Type 2 diabetes mellitus with diabetic autonomic (poly)neuropathy: Secondary | ICD-10-CM | POA: Diagnosis present

## 2021-06-14 DIAGNOSIS — Z886 Allergy status to analgesic agent status: Secondary | ICD-10-CM | POA: Diagnosis not present

## 2021-06-14 DIAGNOSIS — R1084 Generalized abdominal pain: Secondary | ICD-10-CM | POA: Diagnosis present

## 2021-06-14 DIAGNOSIS — M5430 Sciatica, unspecified side: Secondary | ICD-10-CM | POA: Diagnosis present

## 2021-06-14 DIAGNOSIS — E860 Dehydration: Secondary | ICD-10-CM | POA: Diagnosis present

## 2021-06-14 DIAGNOSIS — K760 Fatty (change of) liver, not elsewhere classified: Secondary | ICD-10-CM | POA: Diagnosis present

## 2021-06-14 DIAGNOSIS — E669 Obesity, unspecified: Secondary | ICD-10-CM | POA: Diagnosis present

## 2021-06-14 DIAGNOSIS — H9193 Unspecified hearing loss, bilateral: Secondary | ICD-10-CM | POA: Diagnosis present

## 2021-06-14 DIAGNOSIS — E785 Hyperlipidemia, unspecified: Secondary | ICD-10-CM | POA: Diagnosis present

## 2021-06-14 DIAGNOSIS — F419 Anxiety disorder, unspecified: Secondary | ICD-10-CM | POA: Diagnosis present

## 2021-06-14 DIAGNOSIS — E039 Hypothyroidism, unspecified: Secondary | ICD-10-CM | POA: Diagnosis present

## 2021-06-14 DIAGNOSIS — Z87440 Personal history of urinary (tract) infections: Secondary | ICD-10-CM | POA: Diagnosis not present

## 2021-06-14 DIAGNOSIS — I10 Essential (primary) hypertension: Secondary | ICD-10-CM | POA: Diagnosis present

## 2021-06-14 LAB — HEPATIC FUNCTION PANEL
ALT: 13 U/L (ref 0–44)
AST: 17 U/L (ref 15–41)
Albumin: 2.6 g/dL — ABNORMAL LOW (ref 3.5–5.0)
Alkaline Phosphatase: 58 U/L (ref 38–126)
Bilirubin, Direct: 0.2 mg/dL (ref 0.0–0.2)
Indirect Bilirubin: 0.5 mg/dL (ref 0.3–0.9)
Total Bilirubin: 0.7 mg/dL (ref 0.3–1.2)
Total Protein: 5.1 g/dL — ABNORMAL LOW (ref 6.5–8.1)

## 2021-06-14 LAB — BASIC METABOLIC PANEL
Anion gap: 8 (ref 5–15)
BUN: 13 mg/dL (ref 8–23)
CO2: 19 mmol/L — ABNORMAL LOW (ref 22–32)
Calcium: 8.6 mg/dL — ABNORMAL LOW (ref 8.9–10.3)
Chloride: 112 mmol/L — ABNORMAL HIGH (ref 98–111)
Creatinine, Ser: 0.79 mg/dL (ref 0.44–1.00)
GFR, Estimated: >60 mL/min (ref >60)
Glucose, Bld: 162 mg/dL — ABNORMAL HIGH (ref 70–99)
Potassium: 3.8 mmol/L (ref 3.5–5.1)
Sodium: 139 mmol/L (ref 135–145)

## 2021-06-14 LAB — GLUCOSE, CAPILLARY
Glucose-Capillary: 182 mg/dL — ABNORMAL HIGH (ref 70–99)
Glucose-Capillary: 133 mg/dL — ABNORMAL HIGH (ref 70–99)
Glucose-Capillary: 104 mg/dL — ABNORMAL HIGH (ref 70–99)
Glucose-Capillary: 123 mg/dL — ABNORMAL HIGH (ref 70–99)
Glucose-Capillary: 129 mg/dL — ABNORMAL HIGH (ref 70–99)

## 2021-06-14 LAB — CBC WITH DIFFERENTIAL/PLATELET
Abs Immature Granulocytes: 0.04 10*3/uL (ref 0.00–0.07)
Basophils Absolute: 0 10*3/uL (ref 0.0–0.1)
Basophils Relative: 0 %
Eosinophils Absolute: 0 10*3/uL (ref 0.0–0.5)
Eosinophils Relative: 0 %
HCT: 45.7 % (ref 36.0–46.0)
Hemoglobin: 14.4 g/dL (ref 12.0–15.0)
Immature Granulocytes: 1 %
Lymphocytes Relative: 8 %
Lymphs Abs: 0.5 10*3/uL — ABNORMAL LOW (ref 0.7–4.0)
MCH: 29.1 pg (ref 26.0–34.0)
MCHC: 31.5 g/dL (ref 30.0–36.0)
MCV: 92.5 fL (ref 80.0–100.0)
Monocytes Absolute: 0.3 10*3/uL (ref 0.1–1.0)
Monocytes Relative: 5 %
Neutro Abs: 5.8 10*3/uL (ref 1.7–7.7)
Neutrophils Relative %: 86 %
Platelets: 202 10*3/uL (ref 150–400)
RBC: 4.94 MIL/uL (ref 3.87–5.11)
RDW: 14.7 % (ref 11.5–15.5)
WBC: 6.7 10*3/uL (ref 4.0–10.5)
nRBC: 0 % (ref 0.0–0.2)

## 2021-06-14 LAB — VALPROIC ACID LEVEL: Valproic Acid Lvl: 15 ug/mL — ABNORMAL LOW (ref 50.0–100.0)

## 2021-06-14 LAB — LITHIUM LEVEL: Lithium Lvl: 0.3 mmol/L — ABNORMAL LOW (ref 0.60–1.20)

## 2021-06-14 LAB — LACTIC ACID, PLASMA
Lactic Acid, Venous: 2 mmol/L (ref 0.5–1.9)
Lactic Acid, Venous: 1.9 mmol/L (ref 0.5–1.9)

## 2021-06-14 MED ORDER — DICYCLOMINE HCL 10 MG/ML IM SOLN
20.0000 mg | Freq: Three times a day (TID) | INTRAMUSCULAR | Status: DC | PRN
  Administered 2021-06-14 – 2021-06-16 (×5): 20 mg via INTRAMUSCULAR
  Filled 2021-06-14 (×7): qty 2

## 2021-06-14 MED ORDER — INSULIN ASPART 100 UNIT/ML IJ SOLN
0.0000 [IU] | Freq: Three times a day (TID) | INTRAMUSCULAR | Status: DC
  Administered 2021-06-14: 1 [IU] via SUBCUTANEOUS
  Administered 2021-06-14: 2 [IU] via SUBCUTANEOUS
  Administered 2021-06-15 – 2021-06-16 (×4): 1 [IU] via SUBCUTANEOUS
  Administered 2021-06-16: 2 [IU] via SUBCUTANEOUS
  Administered 2021-06-17: 1 [IU] via SUBCUTANEOUS

## 2021-06-14 MED ORDER — ROPINIROLE HCL 0.5 MG PO TABS
2.0000 mg | ORAL_TABLET | Freq: Every day | ORAL | Status: DC
  Administered 2021-06-14 – 2021-06-16 (×3): 2 mg via ORAL
  Filled 2021-06-14 (×4): qty 4

## 2021-06-14 MED ORDER — OXYCODONE HCL 5 MG PO TABS
5.0000 mg | ORAL_TABLET | Freq: Four times a day (QID) | ORAL | Status: DC | PRN
  Administered 2021-06-14 – 2021-06-17 (×9): 5 mg via ORAL
  Filled 2021-06-14 (×10): qty 1

## 2021-06-14 MED ORDER — FENTANYL CITRATE PF 50 MCG/ML IJ SOSY
50.0000 ug | PREFILLED_SYRINGE | Freq: Once | INTRAMUSCULAR | Status: AC
  Administered 2021-06-14: 50 ug via INTRAVENOUS
  Filled 2021-06-14: qty 1

## 2021-06-14 MED ORDER — LIP MEDEX EX OINT
TOPICAL_OINTMENT | CUTANEOUS | Status: DC | PRN
  Filled 2021-06-14 (×2): qty 7

## 2021-06-14 MED ORDER — ALPRAZOLAM 0.25 MG PO TABS
0.5000 mg | ORAL_TABLET | Freq: Four times a day (QID) | ORAL | Status: DC | PRN
  Administered 2021-06-14 – 2021-06-17 (×6): 0.5 mg via ORAL
  Filled 2021-06-14 (×7): qty 2

## 2021-06-14 MED ORDER — QUETIAPINE FUMARATE 50 MG PO TABS
200.0000 mg | ORAL_TABLET | Freq: Two times a day (BID) | ORAL | Status: DC
  Administered 2021-06-14 – 2021-06-16 (×4): 200 mg via ORAL
  Filled 2021-06-14 (×6): qty 4

## 2021-06-14 MED ORDER — DIVALPROEX SODIUM 500 MG PO DR TAB
500.0000 mg | DELAYED_RELEASE_TABLET | Freq: Every day | ORAL | Status: DC
  Administered 2021-06-14 – 2021-06-16 (×3): 500 mg via ORAL
  Filled 2021-06-14 (×4): qty 1

## 2021-06-14 MED ORDER — GABAPENTIN 600 MG PO TABS
600.0000 mg | ORAL_TABLET | Freq: Two times a day (BID) | ORAL | Status: DC
  Administered 2021-06-14 – 2021-06-17 (×7): 600 mg via ORAL
  Filled 2021-06-14 (×7): qty 1

## 2021-06-14 MED ORDER — ENOXAPARIN SODIUM 40 MG/0.4ML IJ SOSY
40.0000 mg | PREFILLED_SYRINGE | INTRAMUSCULAR | Status: DC
  Administered 2021-06-14: 40 mg via SUBCUTANEOUS
  Filled 2021-06-14: qty 0.4

## 2021-06-14 MED ORDER — LITHIUM CARBONATE ER 450 MG PO TBCR
450.0000 mg | EXTENDED_RELEASE_TABLET | Freq: Every day | ORAL | Status: DC
  Administered 2021-06-14 – 2021-06-16 (×3): 450 mg via ORAL
  Filled 2021-06-14 (×4): qty 1

## 2021-06-14 MED ORDER — DONEPEZIL HCL 10 MG PO TABS
10.0000 mg | ORAL_TABLET | Freq: Every day | ORAL | Status: DC
  Administered 2021-06-14 – 2021-06-16 (×3): 10 mg via ORAL
  Filled 2021-06-14 (×3): qty 1

## 2021-06-14 MED ORDER — ROPINIROLE HCL 0.5 MG PO TABS
1.5000 mg | ORAL_TABLET | Freq: Every day | ORAL | Status: DC
  Administered 2021-06-14 – 2021-06-16 (×2): 1.5 mg via ORAL
  Filled 2021-06-14 (×3): qty 3

## 2021-06-14 MED ORDER — LOSARTAN POTASSIUM 25 MG PO TABS
25.0000 mg | ORAL_TABLET | Freq: Every day | ORAL | Status: DC
  Administered 2021-06-14 – 2021-06-17 (×3): 25 mg via ORAL
  Filled 2021-06-14 (×3): qty 1

## 2021-06-14 MED ORDER — LIOTHYRONINE SODIUM 5 MCG PO TABS
5.0000 ug | ORAL_TABLET | Freq: Every day | ORAL | Status: DC
  Administered 2021-06-14 – 2021-06-17 (×4): 5 ug via ORAL
  Filled 2021-06-14 (×4): qty 1

## 2021-06-14 MED ORDER — ACETAMINOPHEN 325 MG PO TABS: 650.0000 mg | ORAL_TABLET | Freq: Four times a day (QID) | ORAL | Status: DC | PRN

## 2021-06-14 MED ORDER — LEVOTHYROXINE SODIUM 75 MCG PO TABS
75.0000 ug | ORAL_TABLET | Freq: Every day | ORAL | Status: DC
  Administered 2021-06-14 – 2021-06-17 (×4): 75 ug via ORAL
  Filled 2021-06-14 (×4): qty 1

## 2021-06-14 MED ORDER — MORPHINE SULFATE (PF) 4 MG/ML IV SOLN
3.0000 mg | INTRAVENOUS | Status: DC | PRN
  Filled 2021-06-14: qty 1

## 2021-06-14 MED ORDER — ACETAMINOPHEN 650 MG RE SUPP: 650.0000 mg | Freq: Four times a day (QID) | RECTAL | Status: DC | PRN

## 2021-06-14 MED ORDER — INSULIN GLARGINE-YFGN 100 UNIT/ML ~~LOC~~ SOLN
50.0000 [IU] | Freq: Every day | SUBCUTANEOUS | Status: DC
  Administered 2021-06-14: 25 [IU] via SUBCUTANEOUS
  Administered 2021-06-15 – 2021-06-16 (×2): 50 [IU] via SUBCUTANEOUS
  Filled 2021-06-14 (×4): qty 0.5

## 2021-06-14 MED ORDER — ATORVASTATIN CALCIUM 40 MG PO TABS
40.0000 mg | ORAL_TABLET | Freq: Every evening | ORAL | Status: DC
  Administered 2021-06-14 – 2021-06-16 (×3): 40 mg via ORAL
  Filled 2021-06-14 (×3): qty 1

## 2021-06-14 MED ORDER — FENTANYL CITRATE PF 50 MCG/ML IJ SOSY
25.0000 ug | PREFILLED_SYRINGE | INTRAMUSCULAR | Status: DC | PRN
  Administered 2021-06-14 (×2): 25 ug via INTRAVENOUS
  Filled 2021-06-14 (×2): qty 1

## 2021-06-14 MED ORDER — MEMANTINE HCL 10 MG PO TABS
10.0000 mg | ORAL_TABLET | Freq: Every day | ORAL | Status: DC
  Administered 2021-06-14 – 2021-06-16 (×3): 10 mg via ORAL
  Filled 2021-06-14 (×4): qty 1

## 2021-06-14 MED ORDER — SODIUM CHLORIDE 0.9 % IV SOLN
INTRAVENOUS | Status: AC
Start: 2021-06-14 — End: 2021-06-15

## 2021-06-14 MED ORDER — DICYCLOMINE HCL 10 MG/ML IM SOLN
20.0000 mg | Freq: Once | INTRAMUSCULAR | Status: AC
  Administered 2021-06-14: 20 mg via INTRAMUSCULAR
  Filled 2021-06-14: qty 2

## 2021-06-14 NOTE — H&P (Signed)
?History and Physical  ? ? ?[name] [name] SEG:[id number] DOB: 1946-02-14 DOA: 06/13/2021 ? ?PCP: London Pepper, MD  ?Patient coming from: Home. ? ?Chief Complaint: Diarrhea and abdominal pain. ? ?HPI: [name] [name] is a 76 y.o. female with history of recurrent C. difficile colitis, diverticulitis patient underwent recent sigmoid colectomy about a month ago at [facility] with history of diabetes mellitus hypertension bipolar disorder presents to the ER with complaints of crampy abdominal pain with multiple episodes of watery diarrhea for the last 2 days.  Patient was recently on antibiotics 2 weeks ago for UTI.  Patient has had previous episodes of C. difficile colitis. ? ?ED Course: In the ER CT abdomen pelvis was unremarkable stool for C. difficile was positive.  Labs show mild leukocytosis creatinine and BUN was normal.  Since patient has significant abdominal discomfort patient was given Bentyl and also fentanyl.  IV fluids were started and patient was placed on p.o. vancomycin admitted for further observation. ? ?Review of Systems: As per HPI, rest all negative. ? ? ?Past Medical History:  ?Diagnosis Date  ? Alcohol abuse   ? Anxiety   ? takes Valium daily as needed and Ativan daily  ? Bilateral hearing loss   ? Bipolar 1 disorder ([place])   ? takes Lithium nightly and Synthroid daily  ? Chronic back pain   ? DDD; "all over" (09/14/2017)  ? Colitis, ischemic ([place]) 2012  ? Confusion   ? r/t meds  ? Depression   ? takes Prozac daily and Bupspirone   ? Diverticulosis   ? Dyslipidemia   ? takes Crestor daily  ? Fibromyalgia   ? Gastroparesis   ? Headache   ? "weekly" (09/14/2017)  ? Hepatic steatosis 06/18/12  ? severe  ? Hyperlipidemia   ? Hypertension   ? Hypothyroidism   ? IBS (irritable bowel syndrome)   ? Ischemic colitis ([place])   ? Joint pain   ? Joint swelling   ? Lupus erythematosus tumidus   ? tumid-skin  ? Migraine   ? "1-2/yr; maybe" (09/14/2017)  ? Numbness   ? in right foot  ?  Osteoarthritis   ? "all over" (09/14/2017)  ? Osteoarthritis cervical spine   ? Osteoarthritis of hand   ? bilateral  ? Pneumonia   ? "walking pneumonia several times; long time since the last time" (09/14/2017)  ? Restless leg syndrome   ? takes Requip nightly  ? Sciatica   ? Type II diabetes mellitus ([place])   ? Urinary frequency   ? Urinary leakage   ? Urinary urgency   ? Urinary, incontinence, stress female   ? Walking pneumonia   ? last time more than 74yr ago  ? ? ?Past Surgical History:  ?Procedure Laterality Date  ? ABDOMINAL HYSTERECTOMY    ? "they left my ovaries"  ? APPENDECTOMY    ? BALLOON DILATION N/A 06/14/2020  ? Procedure: BALLOON DILATION;  Surgeon: KRonnette Juniper MD;  [facility];  Service: Gastroenterology;  Laterality: N/A;  ? BIOPSY  06/14/2020  ? Procedure: BIOPSY;  Surgeon: KRonnette Juniper MD;  [facility];  Service: Gastroenterology;;  ? CARDIAC CATHETERIZATION  09/14/2017  ? COLONOSCOPY    ? DENTAL SURGERY Left 10/2016  ? dental implant  ? DILATION AND CURETTAGE OF UTERUS  X 4  ? ESOPHAGOGASTRODUODENOSCOPY    ? ESOPHAGOGASTRODUODENOSCOPY (EGD) WITH PROPOFOL N/A 06/14/2020  ? Procedure: ESOPHAGOGASTRODUODENOSCOPY (EGD) WITH PROPOFOL;  Surgeon: KRonnette Juniper MD;  [facility];  Service:  Gastroenterology;  Laterality: N/A;  ? FLEXIBLE SIGMOIDOSCOPY N/A 06/21/2012  ? Procedure: FLEXIBLE SIGMOIDOSCOPY;  Surgeon: Jerene Bears, MD;  [facility];  Service: Gastroenterology;  Laterality: N/A;  ? JOINT REPLACEMENT    ? LEFT HEART CATH AND CORONARY ANGIOGRAPHY N/A 09/14/2017  ? Procedure: LEFT HEART CATH AND CORONARY ANGIOGRAPHY;  Surgeon: Belva Crome, MD;  Location: [facility];  Service: Cardiovascular;  Laterality: N/A;  ? SHOULDER ARTHROSCOPY Right   ? "shaved spurs off rotator cuff"  ? TONSILLECTOMY    ? TOTAL HIP ARTHROPLASTY Right 06/09/2013  ? Procedure: TOTAL HIP ARTHROPLASTY;  Surgeon: Kerin Salen, MD;  Location: [place];  Service: Orthopedics;  Laterality:  Right;  ? TOTAL KNEE ARTHROPLASTY Left 02/07/2019  ? Procedure: TOTAL KNEE ARTHROPLASTY;  Surgeon: Paralee Cancel, MD;  Location: [facility];  Service: Orthopedics;  Laterality: Left;  70 mins  ? TUBAL LIGATION    ? TUMOR EXCISION Right 1968  ? angle of jaw; benign  ? ? ? reports that she has never smoked. She has never used smokeless tobacco. She reports that she does not currently use alcohol. She reports that she does not currently use drugs after having used the following drugs: Benzodiazepines. Frequency: 7.00 times per week. ? ?Allergies  ?Allergen Reactions  ? Codeine Anxiety and Other (See Comments)  ?  Hallucinations, tolerates oxycodone   ? Procaine Hcl Palpitations  ? Aspirin Nausea And Vomiting and Other (See Comments)  ?  Reaction:  Burns pts stomach   ? Augmentin [Amoxicillin-Pot Clavulanate] Diarrhea  ? Benadryl [Diphenhydramine] Other (See Comments)  ?  Per MD "inhibits potency of gabapentin, lithium etc"  ? Diflucan [Fluconazole] Other (See Comments)  ?  Unknown reaction ?  ? Dilaudid [Hydromorphone Hcl] Other (See Comments)  ?  Migraines and nightmares   ? Hydromorphone Other (See Comments)  ? Other Nausea And Vomiting and Other (See Comments)  ?  Pt states that all -mycins cause N/V. (MACROLIDES)   ? Prednisone   ?  Long term steroid problems with lithium and blood sugar.   ? Tramadol Hcl   ?  Other reaction(s): felt weird  ? Sulfa Antibiotics Other (See Comments)  ?  Unknown reaction  ? ? ?Family History  ?Problem Relation Age of Onset  ? Drug abuse Mother   ? Alcohol abuse Mother   ? Alcohol abuse Father   ? Hypertension Father   ? CAD Brother   ? Hypertension Brother   ? Alcohol abuse Brother   ? Hypertension Brother   ? Hypertension Brother   ? Psoriasis Daughter   ? Arthritis Daughter   ?     psoriatic arthritis   ? Alcohol abuse Grandchild   ? ? ?Prior to Admission medications   ?Medication Sig Start Date End Date Taking? Authorizing Provider  ?oxyCODONE-acetaminophen (PERCOCET/ROXICET) 5-325 MG  tablet Take 1 tablet by mouth every 6 (six) hours as needed for severe pain. 06/13/21  Yes Malvin Johns, MD  ?vancomycin (VANCOCIN) 125 MG capsule Take 1 capsule (125 mg total) by mouth 4 (four) times daily. 06/13/21  Yes Malvin Johns, MD  ?acetaminophen (TYLENOL) 325 MG tablet Take 2 tablets (650 mg total) by mouth every 6 (six) hours as needed. 02/11/21   Jeanell Sparrow, DO  ?ALPRAZolam (XANAX) 0.5 MG tablet Take 1 tablet (0.5 mg total) by mouth 4 (four) times daily as needed for anxiety. 02/19/21   [name], [place]  ?atorvastatin (LIPITOR) 40 MG tablet Take 40 mg  by mouth every evening.    [provider]  ?b complex vitamins capsule Take 1 capsule by mouth daily.    [provider]  ?BD PEN NEEDLE NANO 2ND GEN 32G X 4 MM MISC USE TO INJECT 5 TIMES DAILY AS DIRECTED 04/28/21   Elayne Snare, MD  ?cholecalciferol (VITAMIN D3) 25 MCG (1000 UNIT) tablet Take 3,000 Units by mouth daily.    [provider]  ?dicyclomine (BENTYL) 20 MG tablet Take 1 tablet (20 mg total) by mouth 2 (two) times daily as needed for spasms. ?Patient not taking: Reported on 05/22/2021 02/15/21   Sherwood Gambler, MD  ?divalproex (DEPAKOTE) 250 MG DR tablet Take 2 tablets (500 mg total) by mouth at bedtime. 05/02/21   [name], PMHNP  ?donepezil (ARICEPT) 10 MG tablet Take 1 tablet (10 mg total) by mouth at bedtime. 05/02/21   [name], PMHNP  ?gabapentin (NEURONTIN) 600 MG tablet Take 600 mg by mouth 2 (two) times daily. 05/12/19   [provider]  ?glucose blood (CONTOUR NEXT TEST) test strip USE TO TEST BLOOD SUGAR TWICE DAILY 06/20/20   Elayne Snare, MD  ?insulin glargine (LANTUS SOLOSTAR) 100 UNIT/ML Solostar Pen ADMINISTER 64 UNITS UNDER THE SKIN AT BEDTIME 05/06/21   Elayne Snare, MD  ?insulin lispro (HUMALOG KWIKPEN) 100 UNIT/ML KwikPen ADMINISTER 10 UNITS UNDER THE SKIN BEFORE MEALS 05/30/21   Elayne Snare, MD  ?levothyroxine (SYNTHROID) 75 MCG tablet TAKE 1 TABLET BY MOUTH EVERY DAY BEFORE  BREAKFAST 03/27/21   Elayne Snare, MD  ?liothyronine (CYTOMEL) 5 MCG tablet TAKE 1 TABLET BY MOUTH DAILY WITH LEVOTHYROXINE. TAKE BEFORE BREAKFAST 03/27/21   Elayne Snare, MD  ?lithium carbonate (ESKALITH) 450 MG CR tablet

## 2021-06-14 NOTE — Progress Notes (Signed)
?PROGRESS NOTE ? ?[name] [name]  TKP:[id number] DOB: [birth year] DOA: 06/13/2021 ?PCP: London Pepper, MD  ? ?Brief Narrative: ? ?Patient is a 76 year old female with history of recurrent C. difficile colitis, diverticulitis status post recent sigmoid colectomy, diabetes type 2, hypertension, bipolar disorder who presents with crampy abdominal pain, diarrhea from home.  She took antibiotics about 2 weeks ago for UTI.  CT abdomen/pelvis done in the emergency department was negative for acute findings.  Patient was admitted for the management of C. difficile diarrhea, abdominal pain. ? ?Assessment & Plan: ? ?Principal Problem: ?  C. difficile colitis ?Active Problems: ?  Diabetes mellitus with diabetic neuropathy, without long-term current use of insulin ([place]) ?  Hypertension ?  Bipolar 1 disorder ([place]) ?  Hypothyroidism ? ? ?C. difficile colitis : History of recurrent C. difficile colitis.  This is a third time.  Last one was in January this year.  Has severe diarrhea.  Complains of crampy abdominal pain.  CT abdomen/pelvis did not show any acute findings. ?Continue supportive care with IV fluids, pain medication, Bentyl.  Started on oral vancomycin.Lactate has normalized. ? ?Diabetes type 2: On insulin at home.  Monitor blood sugars.  Continue current insulin regimen ? ?Hypertension: On ARB, monitor blood pressure ? ?Hypothyroidism: On Synthyroid, Cytomel ? ?Bipolar disorder: On lithium, Depakote, Seroquel ? ?Restless leg syndrome: Ropinirole ? ?History of diverticulitis: Status post colectomy month ago.  No acute findings as per CT ? ?Obesity: BMI of 33.8 ? ? ? ? ?  ? ? ?  ?  ? ?DVT prophylaxis:enoxaparin (LOVENOX) injection 40 mg Start: 06/14/21 1000 ? ? ?  Code Status: Full Code ? ?Family Communication: Husband at bedside ? ?Patient status:Inpatient ? ?Patient is from :Home ? ?Anticipated discharge NT:ZGYF ? ?Estimated DC date:2-3 days ? ? ?Consultants: None ? ?Procedures:None ? ?Antimicrobials:  ?Anti-infectives  (From admission, onward)  ? ? Start     Dose/Rate Route Frequency Ordered Stop  ? 06/13/21 2300  vancomycin (VANCOCIN) capsule 125 mg       ? 125 mg Oral 4 times daily 06/13/21 1827 06/23/21 2159  ? 06/13/21 0000  vancomycin (VANCOCIN) 125 MG capsule       ? 125 mg Oral 4 times daily 06/13/21 1517    ? ?  ? ? ?Subjective: ? ?Patient seen and examined at the bedside this morning.  Very weak, lying in bed.  Complains of abdominal pain and loose stools.  Husband at bedside.  No nausea or vomiting ? ?Objective: ?Vitals:  ? 06/13/21 2308 06/13/21 2309 06/14/21 0416 06/14/21 0825  ?BP: (!) 147/69 (!) 147/69 136/71 (!) 143/71  ?Pulse: (!) 106 (!) 107 100 97  ?Resp: '18 18 20 17  '$ ?Temp:  98.5 ?F (36.9 ?C) 98.7 ?F (37.1 ?C) 98.1 ?F (36.7 ?C)  ?TempSrc:  Oral Oral Oral  ?SpO2: 97% 96% (!) 89% 96%  ?Weight:  81.3 kg    ?Height:  '5\' 1"'$  (1.549 m)    ? ? ?Intake/Output Summary (Last 24 hours) at 06/14/2021 1317 ?Last data filed at 06/14/2021 0431 ?Gross per 24 hour  ?Intake 714.66 ml  ?Output --  ?Net 714.66 ml  ? ?Filed Weights  ? 06/13/21 1010 06/13/21 2309  ?Weight: 78 kg 81.3 kg  ? ? ?Examination: ? ?General exam: Obese, deconditioned, weak ?HEENT: PERRL ?Respiratory system:  no wheezes or crackles  ?Cardiovascular system: S1 & S2 heard, RRR.  ?Gastrointestinal system: Abdomen is nondistended, soft and nontender. ?Central nervous system: Alert and oriented ?Extremities: No edema,  no clubbing ,no cyanosis ?Skin: No rashes, no ulcers,no icterus   ? ? ?Data Reviewed: I have personally reviewed following labs and imaging studies ? ?CBC: ?Recent Labs  ?Lab 06/13/21 ?1005 06/14/21 ?0406  ?WBC 12.7* 6.7  ?NEUTROABS  --  5.8  ?HGB 15.3* 14.4  ?HCT 49.9* 45.7  ?MCV 92.8 92.5  ?PLT 197 202  ? ?Basic Metabolic Panel: ?Recent Labs  ?Lab 06/13/21 ?1005 06/14/21 ?0406  ?NA 139 139  ?K 4.8 3.8  ?CL 105 112*  ?CO2 22 19*  ?GLUCOSE 178* 162*  ?BUN 14 13  ?CREATININE 0.87 0.79  ?CALCIUM 10.2 8.6*  ? ? ? ?Recent Results (from the past 240 hour(s))   ?C Difficile Quick Screen w PCR reflex     Status: Abnormal  ? Collection Time: 06/13/21 10:40 AM  ? Specimen: STOOL  ?Result Value Ref Range Status  ? C Diff antigen POSITIVE (A) NEGATIVE Final  ? C Diff toxin NEGATIVE NEGATIVE Final  ? C Diff interpretation Results are indeterminate. See PCR results.  Final  ?  Comment: Performed at [facility], [place] [address] [place], [place] [address]  ?C. Diff by PCR, Reflexed     Status: Abnormal  ? Collection Time: 06/13/21 10:40 AM  ?Result Value Ref Range Status  ? Toxigenic C. Difficile by PCR POSITIVE (A) NEGATIVE Final  ?  Comment: Positive for toxigenic C. difficile with little to no toxin production. Only treat if clinical presentation suggests symptomatic illness. ?Performed at [facility], [place] [address] [place], [place] [address] ?  ?  ? ?Radiology Studies: ?CT Abdomen Pelvis Wo Contrast ? ?Result Date: 06/13/2021 ?CLINICAL DATA:  Lower abdominal pain since last night. Colon resection 4 weeks ago. EXAM: CT ABDOMEN AND PELVIS WITHOUT CONTRAST TECHNIQUE: Multidetector CT imaging of the abdomen and pelvis was performed following the standard protocol without IV contrast. RADIATION DOSE REDUCTION: This exam was performed according to the departmental dose-optimization program which includes automated exposure control, adjustment of the mA and/or kV according to patient size and/or use of iterative reconstruction technique. COMPARISON:  CT abdomen pelvis dated June 03, 2021. FINDINGS: Lower chest: No acute abnormality. 5 mm nodule in the left lower lobe is unchanged dating back to March 2022, benign. Hepatobiliary: No focal liver abnormality is seen. No gallstones, gallbladder wall thickening, or biliary dilatation. Pancreas: Unremarkable. No pancreatic ductal dilatation or surrounding inflammatory changes. Spleen: Normal in size without focal abnormality. Adrenals/Urinary Tract: Adrenal glands are unremarkable. Unchanged right renal scarring with  punctate nonobstructive calculus in the lower pole. Resolved right hydronephrosis. New punctate calculus in the midpole of the left kidney. Normal bladder. Stomach/Bowel: Stomach is within normal limits. History of prior appendectomy. No evidence of bowel wall thickening, distention, or inflammatory changes. Prior partial sigmoid colon resection again noted. Similar mild sigmoid colonic diverticulosis. Vascular/Lymphatic: Aortic atherosclerosis. No enlarged abdominal or pelvic lymph nodes. Reproductive: Status post hysterectomy. No adnexal masses. Other: No free fluid or pneumoperitoneum. Musculoskeletal: No acute or significant osseous findings. Prior right total hip arthroplasty. IMPRESSION: 1. No acute intra-abdominal process. 2. Resolved right hydronephrosis. 3. New punctate left nephrolithiasis. Unchanged punctate right nephrolithiasis. 4. Aortic Atherosclerosis (ICD10-I70.0). Electronically Signed   By: Titus Dubin M.D.   On: 06/13/2021 11:16   ? ?Scheduled Meds: ? atorvastatin  40 mg Oral QPM  ? divalproex  500 mg Oral QHS  ? donepezil  10 mg Oral QHS  ? enoxaparin (LOVENOX) injection  40 mg Subcutaneous Q24H  ? gabapentin  600 mg Oral BID  ? insulin aspart  0-9 Units Subcutaneous TID WC  ? insulin glargine-yfgn  50 Units Subcutaneous Q2200  ? levothyroxine  75 mcg Oral Q0600  ? liothyronine  5 mcg Oral Daily  ? lithium carbonate  450 mg Oral QHS  ? losartan  25 mg Oral Daily  ? memantine  10 mg Oral QHS  ? QUEtiapine  200 mg Oral BID  ? rOPINIRole  1.5 mg Oral QHS  ? rOPINIRole  2 mg Oral QAC supper  ? vancomycin  125 mg Oral QID  ? ?Continuous Infusions: ? sodium chloride 125 mL/hr at 06/14/21 0320  ? ? ? LOS: 0 days  ? ?Shelly Coss, MD ?[facility] ?P4/10/2021, 1:17 PM   ?

## 2021-06-15 DIAGNOSIS — A0472 Enterocolitis due to Clostridium difficile, not specified as recurrent: Secondary | ICD-10-CM | POA: Diagnosis not present

## 2021-06-15 LAB — GLUCOSE, CAPILLARY
Glucose-Capillary: 126 mg/dL — ABNORMAL HIGH (ref 70–99)
Glucose-Capillary: 143 mg/dL — ABNORMAL HIGH (ref 70–99)
Glucose-Capillary: 131 mg/dL — ABNORMAL HIGH (ref 70–99)
Glucose-Capillary: 123 mg/dL — ABNORMAL HIGH (ref 70–99)

## 2021-06-15 LAB — CBC WITH DIFFERENTIAL/PLATELET
Abs Immature Granulocytes: 0.02 10*3/uL (ref 0.00–0.07)
Basophils Absolute: 0 10*3/uL (ref 0.0–0.1)
Basophils Relative: 0 %
Eosinophils Absolute: 0.1 10*3/uL (ref 0.0–0.5)
Eosinophils Relative: 2 %
HCT: 40 % (ref 36.0–46.0)
Hemoglobin: 12.4 g/dL (ref 12.0–15.0)
Immature Granulocytes: 0 %
Lymphocytes Relative: 20 %
Lymphs Abs: 1 10*3/uL (ref 0.7–4.0)
MCH: 29.5 pg (ref 26.0–34.0)
MCHC: 31 g/dL (ref 30.0–36.0)
MCV: 95.2 fL (ref 80.0–100.0)
Monocytes Absolute: 0.4 10*3/uL (ref 0.1–1.0)
Monocytes Relative: 8 %
Neutro Abs: 3.6 10*3/uL (ref 1.7–7.7)
Neutrophils Relative %: 70 %
Platelets: 148 10*3/uL — ABNORMAL LOW (ref 150–400)
RBC: 4.2 MIL/uL (ref 3.87–5.11)
RDW: 14.6 % (ref 11.5–15.5)
WBC: 5.2 10*3/uL (ref 4.0–10.5)
nRBC: 0 % (ref 0.0–0.2)

## 2021-06-15 LAB — BASIC METABOLIC PANEL
Anion gap: 3 — ABNORMAL LOW (ref 5–15)
BUN: 9 mg/dL (ref 8–23)
CO2: 21 mmol/L — ABNORMAL LOW (ref 22–32)
Calcium: 8.5 mg/dL — ABNORMAL LOW (ref 8.9–10.3)
Chloride: 115 mmol/L — ABNORMAL HIGH (ref 98–111)
Creatinine, Ser: 0.76 mg/dL (ref 0.44–1.00)
GFR, Estimated: >60 mL/min (ref >60)
Glucose, Bld: 144 mg/dL — ABNORMAL HIGH (ref 70–99)
Potassium: 3.8 mmol/L (ref 3.5–5.1)
Sodium: 139 mmol/L (ref 135–145)

## 2021-06-15 MED ORDER — BACID PO TABS: 2.0000 | ORAL_TABLET | Freq: Three times a day (TID) | ORAL | Status: DC

## 2021-06-15 MED ORDER — ENOXAPARIN SODIUM 40 MG/0.4ML IJ SOSY
40.0000 mg | PREFILLED_SYRINGE | INTRAMUSCULAR | Status: DC
  Administered 2021-06-15 – 2021-06-16 (×2): 40 mg via SUBCUTANEOUS
  Filled 2021-06-15 (×2): qty 0.4

## 2021-06-15 MED ORDER — VANCOMYCIN HCL 125 MG PO CAPS: 125.0000 mg | ORAL_CAPSULE | Freq: Two times a day (BID) | ORAL | Status: DC

## 2021-06-15 MED ORDER — VANCOMYCIN HCL 125 MG PO CAPS: 125.0000 mg | ORAL_CAPSULE | ORAL | Status: DC

## 2021-06-15 MED ORDER — RISAQUAD PO CAPS
1.0000 | ORAL_CAPSULE | Freq: Three times a day (TID) | ORAL | Status: DC
  Administered 2021-06-15 – 2021-06-17 (×6): 1 via ORAL
  Filled 2021-06-15 (×5): qty 1

## 2021-06-15 MED ORDER — DIPHENOXYLATE-ATROPINE 2.5-0.025 MG/5ML PO LIQD
5.0000 mL | Freq: Four times a day (QID) | ORAL | Status: DC | PRN
  Administered 2021-06-15: 5 mL via ORAL
  Filled 2021-06-15 (×3): qty 5

## 2021-06-15 MED ORDER — VANCOMYCIN HCL 125 MG PO CAPS: 125.0000 mg | ORAL_CAPSULE | Freq: Every day | ORAL | Status: DC

## 2021-06-15 MED ORDER — ONDANSETRON HCL 4 MG/2ML IJ SOLN
4.0000 mg | Freq: Four times a day (QID) | INTRAMUSCULAR | Status: DC | PRN
  Filled 2021-06-15: qty 2

## 2021-06-15 MED ORDER — VANCOMYCIN HCL 125 MG PO CAPS
125.0000 mg | ORAL_CAPSULE | Freq: Four times a day (QID) | ORAL | Status: DC
  Administered 2021-06-15 – 2021-06-16 (×4): 125 mg via ORAL
  Filled 2021-06-15 (×5): qty 1

## 2021-06-15 NOTE — Plan of Care (Signed)
  Problem: Elimination: Goal: Will not experience complications related to bowel motility Outcome: Progressing Goal: Will not experience complications related to urinary retention Outcome: Progressing   Problem: Pain Managment: Goal: General experience of comfort will improve Outcome: Progressing   

## 2021-06-15 NOTE — Progress Notes (Signed)
?PROGRESS NOTE ? ?[name] [name]  ZHG:[id number] DOB: [birth year] DOA: 06/13/2021 ?PCP: London Pepper, MD  ? ?Brief Narrative: ? ?Patient is a 76 year old female with history of recurrent C. difficile colitis, diverticulitis status post recent sigmoid colectomy, diabetes type 2, hypertension, bipolar disorder who presents with crampy abdominal pain, diarrhea from home.  She took antibiotics about 2 weeks ago for UTI.  CT abdomen/pelvis done in the emergency department was negative for acute findings.  Patient was admitted for the management of C. difficile diarrhea, abdominal pain.  Hospital course remarkable for profuse diarrhea, abdominal discomfort. ? ?Assessment & Plan: ? ?Principal Problem: ?  C. difficile colitis ?Active Problems: ?  Diabetes mellitus with diabetic neuropathy, without long-term current use of insulin ([place]) ?  Hypertension ?  Bipolar 1 disorder ([place]) ?  Hypothyroidism ? ? ?C. difficile colitis : History of recurrent C. difficile colitis.  This is a third time.  Last one was in January this year.  Has severe diarrhea.  Complains of crampy abdominal pain.  CT abdomen/pelvis did not show any acute findings. ?Continue supportive care with IV fluids, pain medication, Bentyl.  Started on oral vancomycin.Lactate has normalized. ?Rectal tube will be placed, also started on Imodium.  Antiemetics for nausea ? ?Diabetes type 2: On insulin at home.  Monitor blood sugars.  Continue current insulin regimen ? ?Hypertension: On ARB, monitor blood pressure ? ?Hypothyroidism: On Synthyroid, Cytomel ? ?Bipolar disorder: On lithium, Depakote, Seroquel ? ?Restless leg syndrome: On ropinirole ? ?History of diverticulitis: Status post colectomy month ago.  No acute findings as per CT ? ?Obesity: BMI of 33.8 ? ? ?  ?  ? ?DVT prophylaxis:Lovenox ? ? ?  Code Status: Full Code ? ?Family Communication: Husband at bedside ? ?Patient status:Inpatient ? ?Patient is from :Home ? ?Anticipated discharge HD:[facility] ? ?Estimated DC  date:2-3 days ? ? ?Consultants: None ? ?Procedures:None ? ?Antimicrobials:  ?Anti-infectives (From admission, onward)  ? ? Start     Dose/Rate Route Frequency Ordered Stop  ? 06/13/21 2300  vancomycin (VANCOCIN) capsule 125 mg       ? 125 mg Oral 4 times daily 06/13/21 1827 06/23/21 2159  ? 06/13/21 0000  vancomycin (VANCOCIN) 125 MG capsule       ? 125 mg Oral 4 times daily 06/13/21 1517    ? ?  ? ? ?Subjective: ? ?Patient seen and examined at the bedside this morning.  She was still having profuse diarrhea.  Abdominal pain is slightly better today.  Nausea is better.  Husband at the bedside ?Objective: ?Vitals:  ? 06/14/21 1518 06/14/21 2005 06/15/21 0519 06/15/21 9798  ?BP: (!) 116/50 (!) 115/53 138/65 129/65  ?Pulse: 72 100 81 84  ?Resp: '16 18 18 18  '$ ?Temp: 98.5 ?F (36.9 ?C) 98.3 ?F (36.8 ?C) 98 ?F (36.7 ?C) 98.2 ?F (36.8 ?C)  ?TempSrc: Oral Oral Oral Oral  ?SpO2: 99% 97% 99% 97%  ?Weight:   80.5 kg   ?Height:      ? ? ?Intake/Output Summary (Last 24 hours) at 06/15/2021 1110 ?Last data filed at 06/15/2021 0043 ?Gross per 24 hour  ?Intake --  ?Output 675 ml  ?Net -675 ml  ? ?Filed Weights  ? 06/13/21 1010 06/13/21 2309 06/15/21 0519  ?Weight: 78 kg 81.3 kg 80.5 kg  ? ? ?Examination: ? ?General exam: Overall comfortable, not in distress,obese,weak ?HEENT: PERRL ?Respiratory system:  no wheezes or crackles  ?Cardiovascular system: S1 & S2 heard, RRR.  ?Gastrointestinal system: Abdomen is nondistended, soft  and nontender. ?Central nervous system: Alert and oriented ?Extremities: No edema, no clubbing ,no cyanosis ?Skin: No rashes, no ulcers,no icterus     ? ? ?Data Reviewed: I have personally reviewed following labs and imaging studies ? ?CBC: ?Recent Labs  ?Lab 06/13/21 ?1005 06/14/21 ?0406 06/15/21 ?0301  ?WBC 12.7* 6.7 5.2  ?NEUTROABS  --  5.8 3.6  ?HGB 15.3* 14.4 12.4  ?HCT 49.9* 45.7 40.0  ?MCV 92.8 92.5 95.2  ?PLT 197 202 148*  ? ?Basic Metabolic Panel: ?Recent Labs  ?Lab 06/13/21 ?1005 06/14/21 ?0406  06/15/21 ?0301  ?NA 139 139 139  ?K 4.8 3.8 3.8  ?CL 105 112* 115*  ?CO2 22 19* 21*  ?GLUCOSE 178* 162* 144*  ?BUN '14 13 9  '$ ?CREATININE 0.87 0.79 0.76  ?CALCIUM 10.2 8.6* 8.5*  ? ? ? ?Recent Results (from the past 240 hour(s))  ?C Difficile Quick Screen w PCR reflex     Status: Abnormal  ? Collection Time: 06/13/21 10:40 AM  ? Specimen: STOOL  ?Result Value Ref Range Status  ? C Diff antigen POSITIVE (A) NEGATIVE Final  ? C Diff toxin NEGATIVE NEGATIVE Final  ? C Diff interpretation Results are indeterminate. See PCR results.  Final  ?  Comment: Performed at [facility], [place] [address] [place], [place] [address]  ?C. Diff by PCR, Reflexed     Status: Abnormal  ? Collection Time: 06/13/21 10:40 AM  ?Result Value Ref Range Status  ? Toxigenic C. Difficile by PCR POSITIVE (A) NEGATIVE Final  ?  Comment: Positive for toxigenic C. difficile with little to no toxin production. Only treat if clinical presentation suggests symptomatic illness. ?Performed at [facility], [place] [address] [place], [place] [address] ?  ?  ? ?Radiology Studies: ?No results found. ? ?Scheduled Meds: ? acidophilus  1 capsule Oral TID with meals  ? atorvastatin  40 mg Oral QPM  ? divalproex  500 mg Oral QHS  ? donepezil  10 mg Oral QHS  ? gabapentin  600 mg Oral BID  ? insulin aspart  0-9 Units Subcutaneous TID WC  ? insulin glargine-yfgn  50 Units Subcutaneous Q2200  ? levothyroxine  75 mcg Oral Q0600  ? liothyronine  5 mcg Oral Daily  ? lithium carbonate  450 mg Oral QHS  ? losartan  25 mg Oral Daily  ? memantine  10 mg Oral QHS  ? QUEtiapine  200 mg Oral BID  ? rOPINIRole  1.5 mg Oral QHS  ? rOPINIRole  2 mg Oral QAC supper  ? vancomycin  125 mg Oral QID  ? ?Continuous Infusions: ? ? ? ? LOS: 1 day  ? ?Shelly Coss, MD ?[facility] ?P4/11/2021, 11:10 AM   ?

## 2021-06-16 ENCOUNTER — Other Ambulatory Visit (HOSPITAL_COMMUNITY): Payer: Self-pay

## 2021-06-16 DIAGNOSIS — A0472 Enterocolitis due to Clostridium difficile, not specified as recurrent: Secondary | ICD-10-CM | POA: Diagnosis not present

## 2021-06-16 LAB — CBC WITH DIFFERENTIAL/PLATELET
Abs Immature Granulocytes: 0.02 10*3/uL (ref 0.00–0.07)
Basophils Absolute: 0 10*3/uL (ref 0.0–0.1)
Basophils Relative: 0 %
Eosinophils Absolute: 0 10*3/uL (ref 0.0–0.5)
Eosinophils Relative: 1 %
HCT: 40.8 % (ref 36.0–46.0)
Hemoglobin: 13.3 g/dL (ref 12.0–15.0)
Immature Granulocytes: 0 %
Lymphocytes Relative: 23 %
Lymphs Abs: 1.2 10*3/uL (ref 0.7–4.0)
MCH: 29.9 pg (ref 26.0–34.0)
MCHC: 32.6 g/dL (ref 30.0–36.0)
MCV: 91.7 fL (ref 80.0–100.0)
Monocytes Absolute: 0.6 10*3/uL (ref 0.1–1.0)
Monocytes Relative: 11 %
Neutro Abs: 3.3 10*3/uL (ref 1.7–7.7)
Neutrophils Relative %: 65 %
Platelets: 175 10*3/uL (ref 150–400)
RBC: 4.45 MIL/uL (ref 3.87–5.11)
RDW: 14.4 % (ref 11.5–15.5)
WBC: 5 10*3/uL (ref 4.0–10.5)
nRBC: 0 % (ref 0.0–0.2)

## 2021-06-16 LAB — BASIC METABOLIC PANEL
Anion gap: 8 (ref 5–15)
BUN: 7 mg/dL — ABNORMAL LOW (ref 8–23)
CO2: 20 mmol/L — ABNORMAL LOW (ref 22–32)
Calcium: 9.2 mg/dL (ref 8.9–10.3)
Chloride: 109 mmol/L (ref 98–111)
Creatinine, Ser: 0.67 mg/dL (ref 0.44–1.00)
GFR, Estimated: >60 mL/min (ref >60)
Glucose, Bld: 166 mg/dL — ABNORMAL HIGH (ref 70–99)
Potassium: 4.1 mmol/L (ref 3.5–5.1)
Sodium: 137 mmol/L (ref 135–145)

## 2021-06-16 LAB — GLUCOSE, CAPILLARY
Glucose-Capillary: 132 mg/dL — ABNORMAL HIGH (ref 70–99)
Glucose-Capillary: 160 mg/dL — ABNORMAL HIGH (ref 70–99)
Glucose-Capillary: 104 mg/dL — ABNORMAL HIGH (ref 70–99)
Glucose-Capillary: 140 mg/dL — ABNORMAL HIGH (ref 70–99)

## 2021-06-16 MED ORDER — FIDAXOMICIN 200 MG PO TABS
200.0000 mg | ORAL_TABLET | Freq: Two times a day (BID) | ORAL | Status: DC
  Administered 2021-06-16 – 2021-06-17 (×3): 200 mg via ORAL
  Filled 2021-06-16 (×4): qty 1

## 2021-06-16 MED ORDER — SODIUM CHLORIDE 0.9 % IV SOLN: INTRAVENOUS | Status: DC

## 2021-06-16 NOTE — Progress Notes (Signed)
?PROGRESS NOTE ? ?[name] [name]  XVQ:[id number] DOB: Jul 18, 1945 DOA: 06/13/2021 ?PCP: London Pepper, MD  ? ?Brief Narrative: ? ?Patient is a 76 year old female with history of recurrent C. difficile colitis, diverticulitis status post recent sigmoid colectomy, diabetes type 2, hypertension, bipolar disorder who presents with crampy abdominal pain, diarrhea from home.  She took antibiotics about 2 weeks ago for UTI.  CT abdomen/pelvis done in the emergency department was negative for acute findings.  Patient was admitted for the management of C. difficile diarrhea, abdominal pain.  Hospital course remarkable for profuse diarrhea, abdominal discomfort. ? ?Assessment & Plan: ? ?Principal Problem: ?  C. difficile colitis ?Active Problems: ?  Diabetes mellitus with diabetic neuropathy, without long-term current use of insulin ([place]) ?  Hypertension ?  Bipolar 1 disorder ([place]) ?  Hypothyroidism ? ? ?C. difficile colitis : History of recurrent C. difficile colitis.  This is a third time.  Last one was in January this year.  Has severe diarrhea.  Complains of crampy abdominal pain.  CT abdomen/pelvis did not show any acute findings. ?On supportive care with IV fluids, pain medication, Bentyl.  Started on oral vancomycin.Lactate has normalized. ?Rectal tube was placed, also started on Imodium.  Antiemetics for nausea. ?She feels much better today.  Diarrhea has stopped.  Rectal tube removed.  Vancomycin changed to Dificid ? ?Diabetes type 2: On insulin at home.  Monitor blood sugars.  Continue current insulin regimen ? ?Hypertension: On ARB, monitor blood pressure ? ?Hypothyroidism: On Synthyroid, Cytomel ? ?Bipolar disorder: On lithium, Depakote, Seroquel ? ?Restless leg syndrome: On ropinirole ? ?History of diverticulitis: Status post colectomy month ago.  No acute findings as per CT ? ?Obesity: BMI of 33.8 ? ? ?  ?  ? ?DVT prophylaxis:enoxaparin (LOVENOX) injection 40 mg Start: 06/15/21 1200Lovenox ? ? ?  Code Status:  Full Code ? ?Family Communication: Husband at bedside on 4/9 ? ?Patient status:Inpatient ? ?Patient is from :Home ? ?Anticipated discharge KD:TOIZ ? ?Estimated DC date: Tomorrow ? ? ?Consultants: None ? ?Procedures:None ? ?Antimicrobials:  ?Anti-infectives (From admission, onward)  ? ? Start     Dose/Rate Route Frequency Ordered Stop  ? 08/11/21 1000  vancomycin (VANCOCIN) capsule 125 mg  Status:  Discontinued       ?See Hyperspace for full Linked Orders Report.  ? 125 mg Oral Every 3 DAYS 06/15/21 1140 06/16/21 1047  ? 07/14/21 1000  vancomycin (VANCOCIN) capsule 125 mg  Status:  Discontinued       ?See Hyperspace for full Linked Orders Report.  ? 125 mg Oral Every other day 06/15/21 1140 06/16/21 1047  ? 07/07/21 1000  vancomycin (VANCOCIN) capsule 125 mg  Status:  Discontinued       ?See Hyperspace for full Linked Orders Report.  ? 125 mg Oral Daily 06/15/21 1140 06/16/21 1047  ? 06/29/21 2200  vancomycin (VANCOCIN) capsule 125 mg  Status:  Discontinued       ?See Hyperspace for full Linked Orders Report.  ? 125 mg Oral 2 times daily 06/15/21 1140 06/16/21 1047  ? 06/16/21 1145  fidaxomicin (DIFICID) tablet 200 mg       ? 200 mg Oral 2 times daily 06/16/21 1047 06/26/21 0959  ? 06/15/21 1230  vancomycin (VANCOCIN) capsule 125 mg  Status:  Discontinued       ?See Hyperspace for full Linked Orders Report.  ? 125 mg Oral 4 times daily 06/15/21 1140 06/16/21 1047  ? 06/13/21 2300  vancomycin (VANCOCIN) capsule 125 mg  Status:  Discontinued       ? 125 mg Oral 4 times daily 06/13/21 1827 06/15/21 1140  ? 06/13/21 0000  vancomycin (VANCOCIN) 125 MG capsule       ? 125 mg Oral 4 times daily 06/13/21 1517    ? ?  ? ? ?Subjective: ? ?Patient seen and examined at the bedside this morning.  Hemodynamically stable.  Feels better today.  Diarrhea has stopped.  No nausea or vomiting or abdominal pain.   ? ?Objective: ?Vitals:  ? 06/15/21 1956 06/15/21 2043 06/16/21 6269 06/16/21 0802  ?BP: (!) 114/38 (!) 102/51 (!) 95/56 (!)  116/58  ?Pulse: (!) 101  81 84  ?Resp: '17  20 16  '$ ?Temp: 98.1 ?F (36.7 ?C)  98.1 ?F (36.7 ?C) 98.2 ?F (36.8 ?C)  ?TempSrc: Oral  Oral Oral  ?SpO2: 97%  93% 93%  ?Weight:      ?Height:      ? ? ?Intake/Output Summary (Last 24 hours) at 06/16/2021 1132 ?Last data filed at 06/16/2021 0900 ?Gross per 24 hour  ?Intake 660 ml  ?Output --  ?Net 660 ml  ? ?Filed Weights  ? 06/13/21 1010 06/13/21 2309 06/15/21 0519  ?Weight: 78 kg 81.3 kg 80.5 kg  ? ? ?Examination: ? ?General exam: Overall comfortable, not in distress,obese ?HEENT: PERRL ?Respiratory system:  no wheezes or crackles  ?Cardiovascular system: S1 & S2 heard, RRR.  ?Gastrointestinal system: Abdomen is nondistended, soft and nontender. ?Central nervous system: Alert and oriented ?Extremities: No edema, no clubbing ,no cyanosis ?Skin: No rashes, no ulcers,no icterus       ? ? ?Data Reviewed: I have personally reviewed following labs and imaging studies ? ?CBC: ?Recent Labs  ?Lab 06/13/21 ?1005 06/14/21 ?0406 06/15/21 ?0301 06/16/21 ?0111  ?WBC 12.7* 6.7 5.2 5.0  ?NEUTROABS  --  5.8 3.6 3.3  ?HGB 15.3* 14.4 12.4 13.3  ?HCT 49.9* 45.7 40.0 40.8  ?MCV 92.8 92.5 95.2 91.7  ?PLT 197 202 148* 175  ? ?Basic Metabolic Panel: ?Recent Labs  ?Lab 06/13/21 ?1005 06/14/21 ?0406 06/15/21 ?0301 06/16/21 ?0111  ?NA 139 139 139 137  ?K 4.8 3.8 3.8 4.1  ?CL 105 112* 115* 109  ?CO2 22 19* 21* 20*  ?GLUCOSE 178* 162* 144* 166*  ?BUN '14 13 9 '$ 7*  ?CREATININE 0.87 0.79 0.76 0.67  ?CALCIUM 10.2 8.6* 8.5* 9.2  ? ? ? ?Recent Results (from the past 240 hour(s))  ?C Difficile Quick Screen w PCR reflex     Status: Abnormal  ? Collection Time: 06/13/21 10:40 AM  ? Specimen: STOOL  ?Result Value Ref Range Status  ? C Diff antigen POSITIVE (A) NEGATIVE Final  ? C Diff toxin NEGATIVE NEGATIVE Final  ? C Diff interpretation Results are indeterminate. See PCR results.  Final  ?  Comment: Performed at [facility], [place] [address] [place], [place] [address]  ?C. Diff by PCR, Reflexed     Status:  Abnormal  ? Collection Time: 06/13/21 10:40 AM  ?Result Value Ref Range Status  ? Toxigenic C. Difficile by PCR POSITIVE (A) NEGATIVE Final  ?  Comment: Positive for toxigenic C. difficile with little to no toxin production. Only treat if clinical presentation suggests symptomatic illness. ?Performed at [facility], [place] [address] [place], [place] [address] ?  ?  ? ?Radiology Studies: ?No results found. ? ?Scheduled Meds: ? acidophilus  1 capsule Oral TID with meals  ? atorvastatin  40 mg Oral QPM  ?  divalproex  500 mg Oral QHS  ? donepezil  10 mg Oral QHS  ? enoxaparin (LOVENOX) injection  40 mg Subcutaneous Q24H  ? fidaxomicin  200 mg Oral BID  ? gabapentin  600 mg Oral BID  ? insulin aspart  0-9 Units Subcutaneous TID WC  ? insulin glargine-yfgn  50 Units Subcutaneous Q2200  ? levothyroxine  75 mcg Oral Q0600  ? liothyronine  5 mcg Oral Daily  ? lithium carbonate  450 mg Oral QHS  ? losartan  25 mg Oral Daily  ? memantine  10 mg Oral QHS  ? QUEtiapine  200 mg Oral BID  ? rOPINIRole  1.5 mg Oral QHS  ? rOPINIRole  2 mg Oral QAC supper  ? ?Continuous Infusions: ? ? ? ? LOS: 2 days  ? ?Shelly Coss, MD ?[facility] ?P4/12/2021, 11:32 AM   ?

## 2021-06-16 NOTE — Progress Notes (Signed)
MD notified of patients blood pressure, patient is asymptomatic. No new orders at this time will continue to monitor.   ? 06/16/21 7416  ?Vitals  ?Temp 98.1 ?F (36.7 ?C)  ?Temp Source Oral  ?BP (!) 95/56  ?MAP (mmHg) 68  ?BP Location Right Arm  ?BP Method Automatic  ?Patient Position (if appropriate) Lying  ?Pulse Rate 81  ?Pulse Rate Source Monitor  ?Resp 20  ?MEWS COLOR  ?MEWS Score Color Green  ?Oxygen Therapy  ?SpO2 93 %  ?O2 Device Room Air  ?MEWS Score  ?MEWS Temp 0  ?MEWS Systolic 1  ?MEWS Pulse 0  ?MEWS RR 0  ?MEWS LOC 0  ?MEWS Score 1  ? ? ?

## 2021-06-16 NOTE — TOC Benefit Eligibility Note (Signed)
Patient Advocate Encounter ? ?Patient is approved through the [employer] Patient Assistance Program for Dificid through 03/08/2022. ?  ?Medication will be mailed to Patient's Home ? ? ?Lyndel Safe, CPhT ?Pharmacy Patient Advocate Specialist ?[place] Patient Advocate Team ?Direct Number: [phone]  Fax: [phone]  ?

## 2021-06-16 NOTE — TOC Benefit Eligibility Note (Signed)
Patient Advocate Encounter ? ?Insurance verification completed.   ? ?The patient is currently admitted and upon discharge could be taking Dificid 200 mg tablets. ? ?The current 30 day co-pay is, $1,240.99.  ? ?The patient is insured through [facility] Part D  ? ? ? ?Lyndel Safe, CPhT ?Pharmacy Patient Advocate Specialist ?[place] Patient Advocate Team ?Direct Number: [phone]  Fax: [phone] ? ? ? ? ? ?  ?

## 2021-06-16 NOTE — Progress Notes (Signed)
Patient went to the [facility] to urinate, rectal tube came out while she was on the [facility] Patient is refusing rectal tube reinsertion.  ?

## 2021-06-17 ENCOUNTER — Other Ambulatory Visit (HOSPITAL_BASED_OUTPATIENT_CLINIC_OR_DEPARTMENT_OTHER): Payer: Self-pay

## 2021-06-17 DIAGNOSIS — A0472 Enterocolitis due to Clostridium difficile, not specified as recurrent: Secondary | ICD-10-CM | POA: Diagnosis not present

## 2021-06-17 LAB — GLUCOSE, CAPILLARY
Glucose-Capillary: 147 mg/dL — ABNORMAL HIGH (ref 70–99)
Glucose-Capillary: 98 mg/dL (ref 70–99)

## 2021-06-17 MED ORDER — RISAQUAD PO CAPS: 1.0000 | ORAL_CAPSULE | Freq: Three times a day (TID) | ORAL | 0 refills | Status: AC

## 2021-06-17 MED ORDER — OXYCODONE-ACETAMINOPHEN 5-325 MG PO TABS: 1.0000 | ORAL_TABLET | Freq: Four times a day (QID) | ORAL | 0 refills | Status: DC | PRN

## 2021-06-17 MED ORDER — DICYCLOMINE HCL 20 MG PO TABS: 20.0000 mg | ORAL_TABLET | Freq: Three times a day (TID) | ORAL | 0 refills | Status: AC | PRN

## 2021-06-17 MED ORDER — ONDANSETRON HCL 4 MG PO TABS: 4.0000 mg | ORAL_TABLET | Freq: Three times a day (TID) | ORAL | 0 refills | Status: DC | PRN

## 2021-06-17 MED ORDER — METOCLOPRAMIDE HCL 5 MG PO TABS: 5.0000 mg | ORAL_TABLET | Freq: Four times a day (QID) | ORAL | Status: DC | PRN

## 2021-06-17 NOTE — Discharge Summary (Signed)
Physician Discharge Summary  ?[name] [name] HTD:[id number] DOB: [birth year] DOA: 06/13/2021 ? ?PCP: London Pepper, MD ? ?Admit date: 06/13/2021 ?Discharge date: 06/17/2021 ? ?Admitted From: Home ?Disposition:  Home ? ?Discharge Condition:Stable ?CODE STATUS:FULL ?Diet recommendation: Carb Modified  ? ?Brief/Interim Summary: ?Patient is a 76 year old female with history of recurrent C. difficile colitis, diverticulitis status post recent sigmoid colectomy, diabetes type 2, hypertension, bipolar disorder who presents with crampy abdominal pain, diarrhea from home.  She took antibiotics about 2 weeks ago for UTI.  CT abdomen/pelvis done in the emergency department was negative for acute findings.  Patient was admitted for the management of C. difficile diarrhea, abdominal pain.  Hospital course remarkable for profuse diarrhea, abdominal discomfort.  Currently she is symptomatically much better, diarrhea has slowed down.  Denies any abdominal pain.  Medically stable for discharge to home today. ? ?Following problems were addressed during her hospitalization: ? ?C. difficile colitis : History of recurrent C. difficile colitis.  This is a third time.  Last one was in January this year.  Has severe diarrhea.  Complains of crampy abdominal pain.  CT abdomen/pelvis did not show any acute findings. ?On supportive care with IV fluids, pain medication, Bentyl.  Started on oral vancomycin.Lactate has normalized. ?Rectal tube was placed, also started on Imodium. She feels much better today.  Diarrhea has stopped.  Rectal tube removed.  Vancomycin changed to Dificid because  of recurrent C. difficile. ?  ?Diabetes type 2: On insulin at home. Continue current insulin regimen ?  ?Hypertension: On ARB, monitor blood pressure ?  ?Hypothyroidism: On Synthyroid, Cytomel ?  ?Bipolar disorder: On lithium, Depakote, Seroquel ?  ?Restless leg syndrome: On ropinirole ?  ?History of diverticulitis: Status post colectomy month ago.  No acute  findings as per CT ?  ?Obesity: BMI of 33.8 ? ? ?Discharge Diagnoses:  ?Principal Problem: ?  C. difficile colitis ?Active Problems: ?  Diabetes mellitus with diabetic neuropathy, without long-term current use of insulin ([place]) ?  Hypertension ?  Bipolar 1 disorder ([place]) ?  Hypothyroidism ? ? ? ?Discharge Instructions ? ?Discharge Instructions   ? ? Diet Carb Modified   Complete by: As directed ?  ? Discharge instructions   Complete by: As directed ?  ? 1)Please take prescribed medications as instructed.  Medication for C diff  will be mailed to your home.Collect other medications from your pharmacy ?2)Follow-up with your PCP in a week  ? Increase activity slowly   Complete by: As directed ?  ? ?  ? ?Allergies as of 06/17/2021   ? ?   Reactions  ? Codeine Anxiety, Other (See Comments)  ? Hallucinations, tolerates oxycodone   ? Procaine Hcl Palpitations  ? Aspirin Nausea And Vomiting, Other (See Comments)  ? Reaction:  Burns pts stomach   ? Augmentin [amoxicillin-pot Clavulanate] Diarrhea  ? Benadryl [diphenhydramine] Other (See Comments)  ? Per MD "inhibits potency of gabapentin, lithium etc"  ? Diflucan [fluconazole] Other (See Comments)  ? Unknown reaction  ? Dilaudid [hydromorphone Hcl] Other (See Comments)  ? Migraines and nightmares   ? Hydromorphone Other (See Comments)  ? Morphine Other (See Comments)  ? headache  ? Other Nausea And Vomiting, Other (See Comments)  ? Pt states that all -mycins cause N/V. (MACROLIDES)   ? Prednisone   ? Long term steroid problems with lithium and blood sugar.   ? Tramadol Hcl   ? Other reaction(s): felt weird  ? Sulfa Antibiotics Other (See Comments)  ? Unknown  reaction  ? ?  ? ?  ?Medication List  ?  ? ?STOP taking these medications   ? ?metroNIDAZOLE 500 MG tablet ?Commonly known as: FLAGYL ?  ?oxyCODONE 5 MG immediate release tablet ?Commonly known as: Roxicodone ?  ?promethazine 12.5 MG tablet ?Commonly known as: PHENERGAN ?  ?sucralfate 1 g tablet ?Commonly known as:  Carafate ?  ?vancomycin 125 MG capsule ?Commonly known as: VANCOCIN ?  ? ?  ? ?TAKE these medications   ? ?acetaminophen 325 MG tablet ?Commonly known as: Tylenol ?Take 2 tablets (650 mg total) by mouth every 6 (six) hours as needed. ?What changed: reasons to take this ?  ?acidophilus Caps capsule ?Take 1 capsule by mouth with breakfast, with lunch, and with evening meal for 10 days. ?  ?ALPRAZolam 0.5 MG tablet ?Commonly known as: Duanne Moron ?Take 1 tablet (0.5 mg total) by mouth 4 (four) times daily as needed for anxiety. ?  ?atorvastatin 40 MG tablet ?Commonly known as: LIPITOR ?Take 40 mg by mouth every evening. ?  ?b complex vitamins capsule ?Take 1 capsule by mouth daily. ?  ?BD Pen Needle Nano 2nd Gen 32G X 4 MM Misc ?Generic drug: Insulin Pen Needle ?USE TO INJECT 5 TIMES DAILY AS DIRECTED ?  ?cholecalciferol 25 MCG (1000 UNIT) tablet ?Commonly known as: VITAMIN D3 ?Take 1,000 Units by mouth daily. Also takes 2,000 units capsule at bedtime ?  ?Vitamin D3 50 MCG (2000 UT) Tabs ?Take 2,000 Units by mouth at bedtime. Also takes 1,000 units capsule in the morning ?  ?Contour Next Test test strip ?Generic drug: glucose blood ?USE TO TEST BLOOD SUGAR TWICE DAILY ?  ?dicyclomine 20 MG tablet ?Commonly known as: BENTYL ?Take 1 tablet (20 mg total) by mouth 3 (three) times daily as needed for spasms. ?What changed: when to take this ?  ?divalproex 250 MG DR tablet ?Commonly known as: DEPAKOTE ?Take 2 tablets (500 mg total) by mouth at bedtime. ?  ?donepezil 10 MG tablet ?Commonly known as: ARICEPT ?Take 1 tablet (10 mg total) by mouth at bedtime. ?  ?gabapentin 600 MG tablet ?Commonly known as: NEURONTIN ?Take 600 mg by mouth 3 (three) times daily. ?  ?insulin lispro 100 UNIT/ML KwikPen ?Commonly known as: HumaLOG KwikPen ?ADMINISTER 10 UNITS UNDER THE SKIN BEFORE MEALS ?What changed:  ?how much to take ?how to take this ?when to take this ?reasons to take this ?additional instructions ?  ?Lantus SoloStar 100 UNIT/ML  Solostar Pen ?Generic drug: insulin glargine ?ADMINISTER 64 UNITS UNDER THE SKIN AT BEDTIME ?What changed:  ?how much to take ?how to take this ?when to take this ?additional instructions ?  ?levothyroxine 75 MCG tablet ?Commonly known as: SYNTHROID ?TAKE 1 TABLET BY MOUTH EVERY DAY BEFORE BREAKFAST ?What changed: See the new instructions. ?  ?liothyronine 5 MCG tablet ?Commonly known as: CYTOMEL ?TAKE 1 TABLET BY MOUTH DAILY WITH LEVOTHYROXINE. TAKE BEFORE BREAKFAST ?What changed: See the new instructions. ?  ?lithium carbonate 450 MG CR tablet ?Commonly known as: ESKALITH ?Take 1 tablet (450 mg total) by mouth at bedtime. ?  ?losartan 25 MG tablet ?Commonly known as: COZAAR ?Take 25 mg by mouth every evening. ?  ?memantine 10 MG tablet ?Commonly known as: NAMENDA ?TAKE 1 TABLET(10 MG) BY MOUTH EVERY EVENING ?What changed:  ?how much to take ?how to take this ?when to take this ?additional instructions ?  ?metoCLOPramide 5 MG tablet ?Commonly known as: REGLAN ?Take 1 tablet (5 mg total) by mouth every 6 (six)  hours as needed for up to 15 days for nausea or vomiting. ?  ?Microlet Lancets Misc ?TEST DAILY AS DIRECTED ?  ?nitroGLYCERIN 0.4 MG SL tablet ?Commonly known as: NITROSTAT ?Place 1 tablet (0.4 mg total) under the tongue every 5 (five) minutes as needed for chest pain. ?  ?ondansetron 4 MG tablet ?Commonly known as: ZOFRAN ?Take 1 tablet (4 mg total) by mouth every 8 (eight) hours as needed for nausea or vomiting. ?  ?oxyCODONE-acetaminophen 5-325 MG tablet ?Commonly known as: PERCOCET/ROXICET ?Take 1 tablet by mouth every 6 (six) hours as needed for severe pain. ?What changed: how much to take ?  ?QUEtiapine 200 MG tablet ?Commonly known as: SEROQUEL ?TAKE 1 TAB PO q 5 pm and 1 tab po QHS ?What changed:  ?how much to take ?how to take this ?when to take this ?additional instructions ?  ?rOPINIRole 0.5 MG tablet ?Commonly known as: REQUIP ?Takes 4 tabs at 5 pm and 3 tabs at bedtime and 1-2 tabs prn ?What  changed:  ?how much to take ?how to take this ?when to take this ?additional instructions ?  ?tiZANidine 4 MG tablet ?Commonly known as: Zanaflex ?Take 1 tablet (4 mg total) by mouth every 6 (six) hours as needed for mus

## 2021-06-17 NOTE — Progress Notes (Signed)
Discharge instructions given to the patient and patient's legal gurdian, Richard Cobb.  Both verbalized understanding.  Needs addressed.  Encouraged to call the doctor for questions.  Discharged home. ?

## 2021-06-18 ENCOUNTER — Other Ambulatory Visit: Payer: Self-pay | Admitting: Endocrinology

## 2021-06-18 DIAGNOSIS — E119 Type 2 diabetes mellitus without complications: Secondary | ICD-10-CM

## 2021-06-20 DIAGNOSIS — E785 Hyperlipidemia, unspecified: Secondary | ICD-10-CM | POA: Diagnosis not present

## 2021-06-20 DIAGNOSIS — E1165 Type 2 diabetes mellitus with hyperglycemia: Secondary | ICD-10-CM | POA: Diagnosis not present

## 2021-06-20 DIAGNOSIS — N3000 Acute cystitis without hematuria: Secondary | ICD-10-CM | POA: Diagnosis not present

## 2021-06-20 DIAGNOSIS — E039 Hypothyroidism, unspecified: Secondary | ICD-10-CM | POA: Diagnosis not present

## 2021-06-20 DIAGNOSIS — I10 Essential (primary) hypertension: Secondary | ICD-10-CM | POA: Diagnosis not present

## 2021-06-23 DIAGNOSIS — Z09 Encounter for follow-up examination after completed treatment for conditions other than malignant neoplasm: Secondary | ICD-10-CM | POA: Diagnosis not present

## 2021-06-23 DIAGNOSIS — E1165 Type 2 diabetes mellitus with hyperglycemia: Secondary | ICD-10-CM | POA: Diagnosis not present

## 2021-06-23 DIAGNOSIS — Z0000 Encounter for general adult medical examination without abnormal findings: Secondary | ICD-10-CM | POA: Diagnosis not present

## 2021-06-23 DIAGNOSIS — E039 Hypothyroidism, unspecified: Secondary | ICD-10-CM | POA: Diagnosis not present

## 2021-06-23 DIAGNOSIS — E785 Hyperlipidemia, unspecified: Secondary | ICD-10-CM | POA: Diagnosis not present

## 2021-06-23 DIAGNOSIS — F319 Bipolar disorder, unspecified: Secondary | ICD-10-CM | POA: Diagnosis not present

## 2021-06-23 DIAGNOSIS — M797 Fibromyalgia: Secondary | ICD-10-CM | POA: Diagnosis not present

## 2021-06-23 DIAGNOSIS — I10 Essential (primary) hypertension: Secondary | ICD-10-CM | POA: Diagnosis not present

## 2021-06-23 DIAGNOSIS — R350 Frequency of micturition: Secondary | ICD-10-CM | POA: Diagnosis not present

## 2021-06-23 DIAGNOSIS — E041 Nontoxic single thyroid nodule: Secondary | ICD-10-CM | POA: Diagnosis not present

## 2021-06-23 DIAGNOSIS — R300 Dysuria: Secondary | ICD-10-CM | POA: Diagnosis not present

## 2021-06-25 DIAGNOSIS — E785 Hyperlipidemia, unspecified: Secondary | ICD-10-CM | POA: Diagnosis not present

## 2021-06-25 DIAGNOSIS — Z0000 Encounter for general adult medical examination without abnormal findings: Secondary | ICD-10-CM | POA: Diagnosis not present

## 2021-06-25 DIAGNOSIS — E559 Vitamin D deficiency, unspecified: Secondary | ICD-10-CM | POA: Diagnosis not present

## 2021-06-30 DIAGNOSIS — K5790 Diverticulosis of intestine, part unspecified, without perforation or abscess without bleeding: Secondary | ICD-10-CM | POA: Diagnosis not present

## 2021-06-30 DIAGNOSIS — Z8719 Personal history of other diseases of the digestive system: Secondary | ICD-10-CM | POA: Diagnosis not present

## 2021-06-30 DIAGNOSIS — A498 Other bacterial infections of unspecified site: Secondary | ICD-10-CM | POA: Diagnosis not present

## 2021-06-30 DIAGNOSIS — K3184 Gastroparesis: Secondary | ICD-10-CM | POA: Diagnosis not present

## 2021-07-02 ENCOUNTER — Other Ambulatory Visit: Payer: Self-pay | Admitting: Family Medicine

## 2021-07-02 DIAGNOSIS — E041 Nontoxic single thyroid nodule: Secondary | ICD-10-CM

## 2021-07-02 DIAGNOSIS — E2839 Other primary ovarian failure: Secondary | ICD-10-CM

## 2021-07-03 ENCOUNTER — Encounter: Payer: Self-pay | Admitting: Psychiatry

## 2021-07-03 ENCOUNTER — Ambulatory Visit (INDEPENDENT_AMBULATORY_CARE_PROVIDER_SITE_OTHER): Payer: Medicare Other | Admitting: Psychiatry

## 2021-07-03 ENCOUNTER — Other Ambulatory Visit: Payer: Self-pay | Admitting: Family Medicine

## 2021-07-03 DIAGNOSIS — G2581 Restless legs syndrome: Secondary | ICD-10-CM | POA: Diagnosis not present

## 2021-07-03 DIAGNOSIS — F411 Generalized anxiety disorder: Secondary | ICD-10-CM

## 2021-07-03 DIAGNOSIS — F5101 Primary insomnia: Secondary | ICD-10-CM | POA: Diagnosis not present

## 2021-07-03 DIAGNOSIS — Z1231 Encounter for screening mammogram for malignant neoplasm of breast: Secondary | ICD-10-CM

## 2021-07-03 DIAGNOSIS — F319 Bipolar disorder, unspecified: Secondary | ICD-10-CM

## 2021-07-03 MED ORDER — QUETIAPINE FUMARATE 200 MG PO TABS: ORAL_TABLET | ORAL | 0 refills | Status: DC

## 2021-07-03 MED ORDER — DIVALPROEX SODIUM 250 MG PO DR TAB: 500.0000 mg | DELAYED_RELEASE_TABLET | Freq: Every day | ORAL | 0 refills | Status: DC

## 2021-07-03 MED ORDER — ROPINIROLE HCL 0.5 MG PO TABS: ORAL_TABLET | ORAL | 0 refills | Status: DC

## 2021-07-03 MED ORDER — MEMANTINE HCL 10 MG PO TABS: ORAL_TABLET | ORAL | 0 refills | Status: DC

## 2021-07-03 MED ORDER — LITHIUM CARBONATE ER 450 MG PO TBCR: 450.0000 mg | EXTENDED_RELEASE_TABLET | Freq: Every day | ORAL | 0 refills | Status: DC

## 2021-07-03 MED ORDER — ALPRAZOLAM 0.5 MG PO TABS: 0.5000 mg | ORAL_TABLET | Freq: Four times a day (QID) | ORAL | 2 refills | Status: DC | PRN

## 2021-07-03 MED ORDER — DONEPEZIL HCL 10 MG PO TABS: 10.0000 mg | ORAL_TABLET | Freq: Every day | ORAL | 0 refills | Status: DC

## 2021-07-03 NOTE — Progress Notes (Signed)
[name] [name] ?[id number] ?[birth year] ?76 y.o. ? ?Virtual Visit via Telephone Note ? ?I connected with pt on 07/03/21 at 10:30 AM EDT by telephone and verified that I am speaking with the correct person using two identifiers. ?  ?I discussed the limitations, risks, security and privacy concerns of performing an evaluation and management service by telephone and the availability of in person appointments. I also discussed with the patient that there may be a patient responsible charge related to this service. The patient expressed understanding and agreed to proceed. ?  ?I discussed the assessment and treatment plan with the patient. The patient was provided an opportunity to ask questions and all were answered. The patient agreed with the plan and demonstrated an understanding of the instructions. ?  ?The patient was advised to call back or seek an in-person evaluation if the symptoms worsen or if the condition fails to improve as anticipated. ? ?I provided 25 minutes of non-face-to-face time during this encounter.  The patient was located at home.  The provider was located at [place]. ? ? ?[name], PMHNP ? ? ?Subjective:  ? ?Patient ID:  [name] [name] is a 76 y.o. (DOB 26-Dec-1945) female. ? ?Chief Complaint:  ?Chief Complaint  ?Patient presents with  ? Insomnia  ? Anxiety  ? Depression  ? ? ?HPI ?[name] [name] presents for follow-up of Bipolar Disorder, anxiety, and insomnia. She had a UTI and this led to c.diff and was hospitalized for 5 days. She has had c. Diff three times and has been referred to [facility] for possible fecal transplant. She has had very low energy with c diff. She is on a soft diet and reports that her appetite has been ok. She is awaiting referral to dietician. She reports that her mood has been "up and down. I am trying really hard not to let it get me down." She reports that she occasionally has anxiety about the future. She reports occ periods of depression and  other times she tries to get out. She reports that she has been unable to get out much due to bowel. Denies manic s/s. "I'm not sleeping good and I'm thinking it is because I am stressed." She reports that her sleep has been fragmented. She reports that her motivation is ok, "I just can't follow through with it." Some difficulty with concentration due to distractions with anxious thoughts. She reports that she feels like she is a burden. Denies SI. "I do think I don't want to live the rest of my life like this" with diminished quality of life due to physical issues.  ? ?Has been baking some. She recently processed strawberries. She has been coloring in some adult coloring books. Trying to stay connected with friends by phone.  ? ?Reports that family and friends have been very supportive.  ? ?Past Medication Trials: ?Tegretol-adverse reaction ?Lamictal-itching, swollen lymph nodes ?Trileptal-ineffective ?Depakote- has taken long-term with some benefit ?Gabapentin-prescribed for RLS, pain, and anxiety.  Unable to tolerate doses above 400 mg 3 times daily ?Topamax ?Gabitril ?Lyrica ?Keppra ?Zonegran ?[place]-intermittently effective ?Xanax-effective ?Ativan ?Valium ?Klonopin-patient reports feeling "peculiar" ?Lithium-has taken long-term with some improvement.  Unable to tolerate doses greater than 450 mg ?Seroquel- helpful for racing thoughts and mood signs and symptoms.  Daytime somnolence with higher doses.  Has caused RLS without Requip. ?Geodon-tolerability issues ?Rexulti-EPS ?Latuda-EPS ?Vraylar-EPS ?Saphris-excessive sedation and severe RLS ?Abilify-may have exacerbated gambling ?Risperdal ?Olanzapine- RLS, increased appetite, wt gain ?Perphenazine ?Loxapine- severe adverse effects at low  dose. Had weakness, twitches ?BuSpar-ineffective ?Prozac-effective and then stopped working ?Zoloft-could not tolerate ?Lexapro-has used short-term to alleviate depressive signs and symptoms ?Remeron ?Wellbutrin ?Vistaril-  ineffective ?Requip- takes due to restless legs secondary to Seroquel.  Has taken long-term and reports being on higher doses in the past.  Denies correlation between Requip and gambling. ?Pramipexole ?NAC ?Deplin ?Buprenorphine ?Namenda ?Verapamil ?Pindolol ?Isradapine ?Clonidine ?Lunesta-ineffective ?Belsomra- ineffective ?Dayvigo- Legs "buckled up." ?Ambien ?Ambien CR-in effective ?Trazodone-nightmares ?Doxepin- ineffective ?Remeron ? ?Review of Systems:  ?Review of Systems  ?HENT:  Positive for dental problem.   ?Gastrointestinal:  Positive for abdominal distention and diarrhea.  ?Musculoskeletal:  Negative for gait problem.  ?Psychiatric/Behavioral:    ?     Please refer to HPI  ? ?Medications: I have reviewed the patient's current medications. ? ?Current Outpatient Medications  ?Medication Sig Dispense Refill  ? acetaminophen (TYLENOL) 325 MG tablet Take 2 tablets (650 mg total) by mouth every 6 (six) hours as needed. (Patient taking differently: Take 650 mg by mouth every 6 (six) hours as needed for mild pain.) 36 tablet 0  ? atorvastatin (LIPITOR) 40 MG tablet Take 40 mg by mouth every evening.  0  ? b complex vitamins capsule Take 1 capsule by mouth daily.    ? cholecalciferol (VITAMIN D3) 25 MCG (1000 UNIT) tablet Take 1,000 Units by mouth daily. Also takes 2,000 units capsule at bedtime    ? Cholecalciferol (VITAMIN D3) 50 MCG (2000 UT) TABS Take 2,000 Units by mouth at bedtime. Also takes 1,000 units capsule in the morning    ? Cyanocobalamin (VITAMIN B-12 PO) Take 1 tablet by mouth daily.    ? dicyclomine (BENTYL) 20 MG tablet Take 1 tablet (20 mg total) by mouth 3 (three) times daily as needed for spasms. 20 tablet 0  ? gabapentin (NEURONTIN) 600 MG tablet Take 600 mg by mouth 3 (three) times daily.    ? insulin lispro (HUMALOG KWIKPEN) 100 UNIT/ML KwikPen ADMINISTER 10 UNITS UNDER THE SKIN BEFORE MEALS (Patient taking differently: Inject 10 Units into the skin 3 (three) times daily as needed (high  blood sugar).) 15 mL 0  ? LANTUS SOLOSTAR 100 UNIT/ML Solostar Pen ADMINISTER 64 UNITS UNDER THE SKIN AT BEDTIME 15 mL 2  ? levothyroxine (SYNTHROID) 75 MCG tablet TAKE 1 TABLET BY MOUTH EVERY DAY BEFORE BREAKFAST (Patient taking differently: Take 75 mcg by mouth daily before breakfast.) 90 tablet 1  ? liothyronine (CYTOMEL) 5 MCG tablet TAKE 1 TABLET BY MOUTH DAILY WITH LEVOTHYROXINE. TAKE BEFORE BREAKFAST (Patient taking differently: Take 5 mcg by mouth daily.) 90 tablet 1  ? losartan (COZAAR) 25 MG tablet Take 25 mg by mouth every evening.    ? ondansetron (ZOFRAN) 4 MG tablet Take 1 tablet (4 mg total) by mouth every 8 (eight) hours as needed for nausea or vomiting. 20 tablet 0  ? oxyCODONE-acetaminophen (PERCOCET/ROXICET) 5-325 MG tablet Take 1 tablet by mouth every 6 (six) hours as needed for severe pain. 15 tablet 0  ? tiZANidine (ZANAFLEX) 4 MG tablet Take 1 tablet (4 mg total) by mouth every 6 (six) hours as needed for muscle spasms. 40 tablet 0  ? triamcinolone (NASACORT) 55 MCG/ACT AERO nasal inhaler Place 2 sprays into the nose daily as needed (allergies).    ? [START ON 07/28/2021] ALPRAZolam (XANAX) 0.5 MG tablet Take 1 tablet (0.5 mg total) by mouth 4 (four) times daily as needed for anxiety. 120 tablet 2  ? BD PEN NEEDLE NANO 2ND GEN 32G X 4  MM MISC USE TO INJECT 5 TIMES DAILY AS DIRECTED 300 each 0  ? divalproex (DEPAKOTE) 250 MG DR tablet Take 2 tablets (500 mg total) by mouth at bedtime. 180 tablet 0  ? donepezil (ARICEPT) 10 MG tablet Take 1 tablet (10 mg total) by mouth at bedtime. 90 tablet 0  ? glucose blood (CONTOUR NEXT TEST) test strip USE TO TEST BLOOD SUGAR TWICE DAILY 100 strip 3  ? lithium carbonate (ESKALITH) 450 MG CR tablet Take 1 tablet (450 mg total) by mouth at bedtime. 90 tablet 0  ? memantine (NAMENDA) 10 MG tablet TAKE 1 TABLET(10 MG) BY MOUTH EVERY EVENING 90 tablet 0  ? metoCLOPramide (REGLAN) 5 MG tablet Take 1 tablet (5 mg total) by mouth every 6 (six) hours as needed for up  to 15 days for nausea or vomiting.    ? Microlet Lancets MISC TEST DAILY AS DIRECTED 100 each 3  ? nitroGLYCERIN (NITROSTAT) 0.4 MG SL tablet Place 1 tablet (0.4 mg total) under the tongue every 5 (five) minu

## 2021-07-04 ENCOUNTER — Emergency Department (HOSPITAL_BASED_OUTPATIENT_CLINIC_OR_DEPARTMENT_OTHER)
Admission: EM | Admit: 2021-07-04 | Discharge: 2021-07-04 | Disposition: A | Discharge status: Home / Self Care | Payer: Medicare Other | Attending: Emergency Medicine | Admitting: Emergency Medicine

## 2021-07-04 ENCOUNTER — Ambulatory Visit
Admission: RE | Admit: 2021-07-04 | Discharge: 2021-07-04 | Disposition: A | Discharge status: Home / Self Care | Payer: Medicare Other | Source: Ambulatory Visit | Attending: Family Medicine | Admitting: Family Medicine

## 2021-07-04 ENCOUNTER — Other Ambulatory Visit: Payer: Self-pay

## 2021-07-04 ENCOUNTER — Encounter (HOSPITAL_BASED_OUTPATIENT_CLINIC_OR_DEPARTMENT_OTHER): Payer: Self-pay | Admitting: Obstetrics and Gynecology

## 2021-07-04 DIAGNOSIS — M5441 Lumbago with sciatica, right side: Secondary | ICD-10-CM | POA: Insufficient documentation

## 2021-07-04 DIAGNOSIS — M5431 Sciatica, right side: Secondary | ICD-10-CM

## 2021-07-04 DIAGNOSIS — E119 Type 2 diabetes mellitus without complications: Secondary | ICD-10-CM | POA: Diagnosis not present

## 2021-07-04 DIAGNOSIS — Z794 Long term (current) use of insulin: Secondary | ICD-10-CM | POA: Insufficient documentation

## 2021-07-04 DIAGNOSIS — E041 Nontoxic single thyroid nodule: Secondary | ICD-10-CM

## 2021-07-04 DIAGNOSIS — I10 Essential (primary) hypertension: Secondary | ICD-10-CM | POA: Insufficient documentation

## 2021-07-04 DIAGNOSIS — Z79899 Other long term (current) drug therapy: Secondary | ICD-10-CM | POA: Diagnosis not present

## 2021-07-04 DIAGNOSIS — M545 Low back pain, unspecified: Secondary | ICD-10-CM | POA: Diagnosis present

## 2021-07-04 DIAGNOSIS — E042 Nontoxic multinodular goiter: Secondary | ICD-10-CM | POA: Diagnosis not present

## 2021-07-04 IMAGING — CT CT ABD-PELV W/ CM
2 of 5 series · 14 of 46 positions shown, 16 images · IV contrast (omnipaque)
Comparison: CT 11/29/2019

CLINICAL DATA: Shortness of breath, lower abdominal pain and
history of diverticulitis, pain for 6 days

EXAM:
CT ABDOMEN AND PELVIS WITH CONTRAST
TECHNIQUE: Multidetector CT imaging of the abdomen and pelvis was performed
using the standard protocol following bolus administration of
intravenous contrast.
CONTRAST:  100mL OMNIPAQUE IOHEXOL 300 MG/ML  SOLN

[Series 2: axial st · axial · 0.79mm/px · z∈[-476,-76]mm · 11 of 94 slices shown, 13 images]
[im 7/94  soft-tissue]
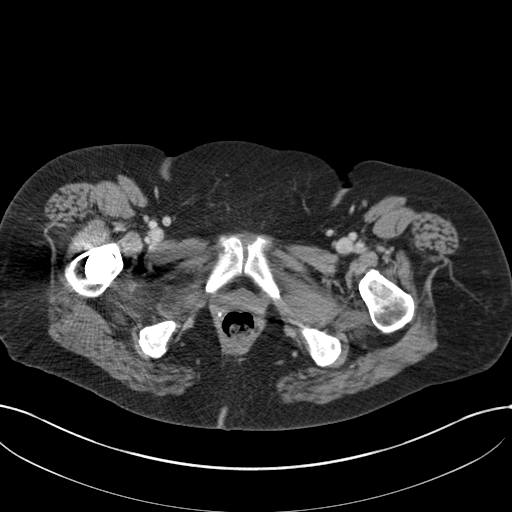
[im 7/94  bone]
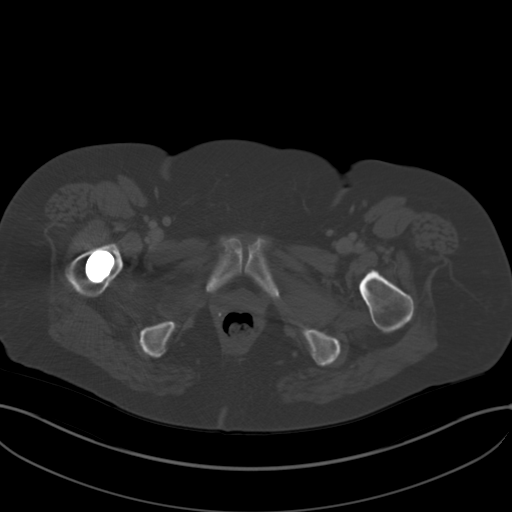
[im 13/94  soft-tissue]
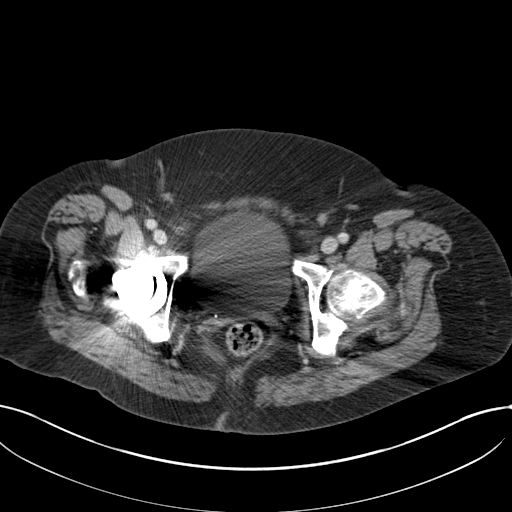
[im 25/94  soft-tissue]
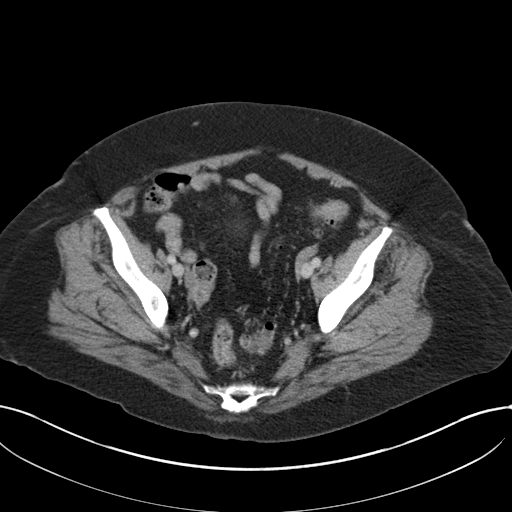
[im 32/94  soft-tissue]
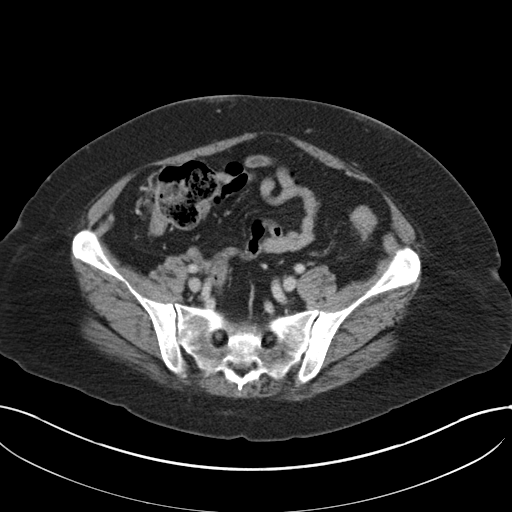
[im 38/94  soft-tissue]
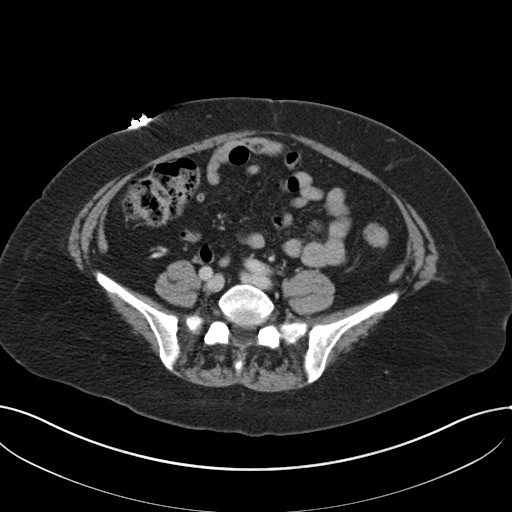
[im 50/94  soft-tissue]
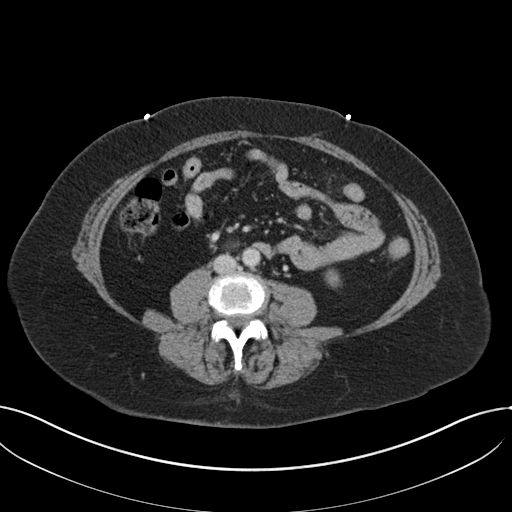
[im 56/94  soft-tissue]
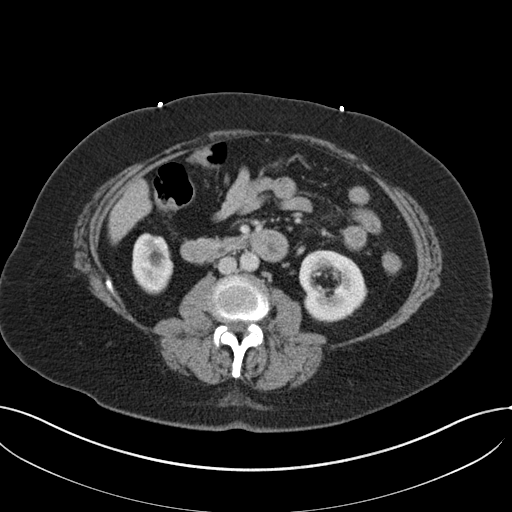
[im 63/94  soft-tissue]
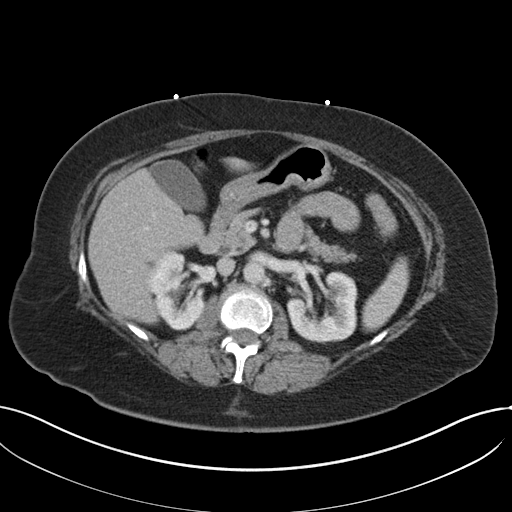
[im 69/94  soft-tissue]
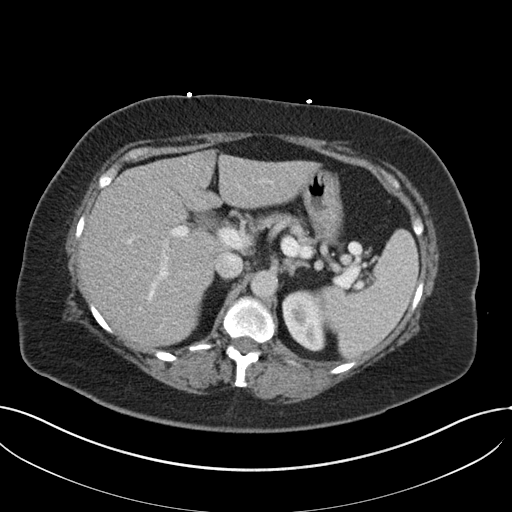
[im 69/94  bone]
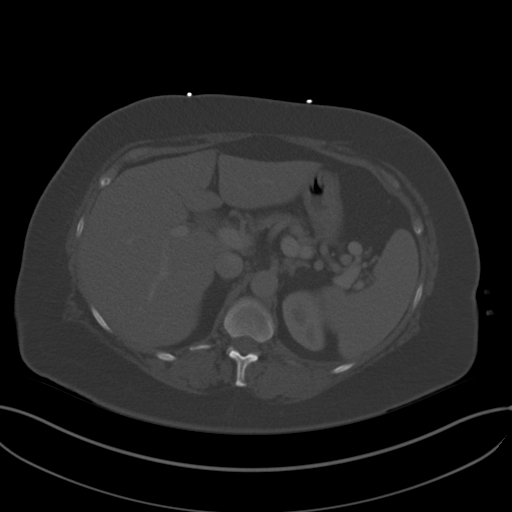
[im 81/94  soft-tissue]
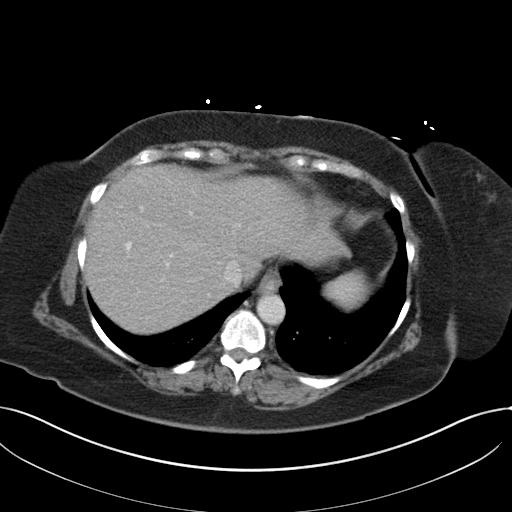
[im 87/94  soft-tissue]
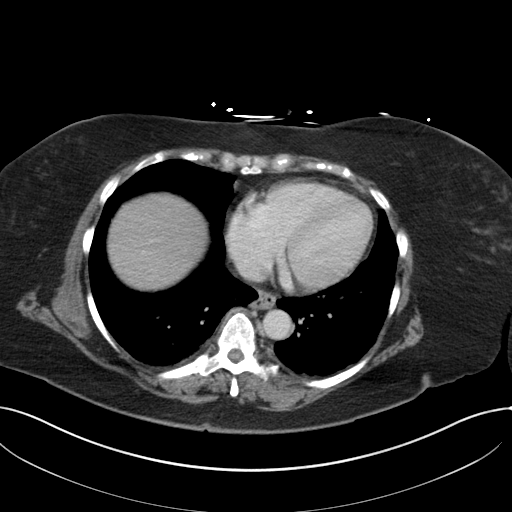

[Series 5: coronal st · coronal · 0.80mm/px · 3 of 146 slices shown]
[im 49/146  soft-tissue]
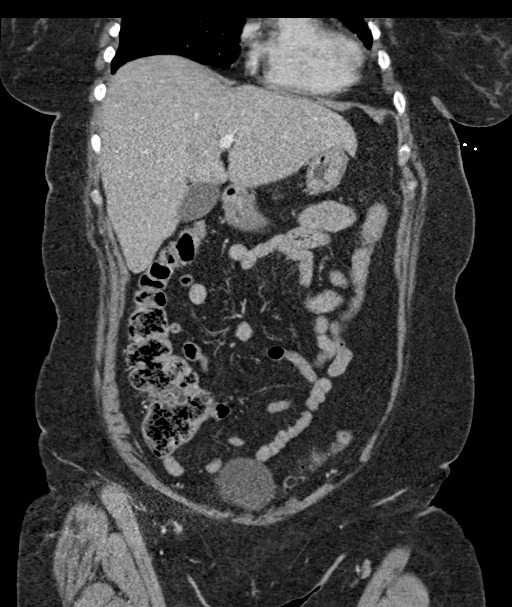
[im 65/146  soft-tissue]
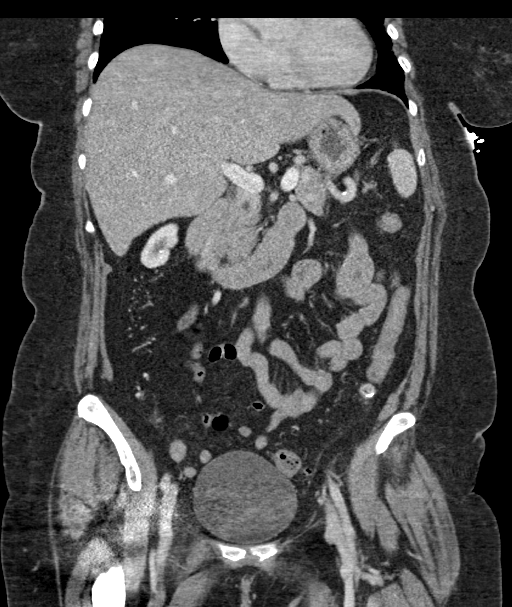
[im 81/146  soft-tissue]
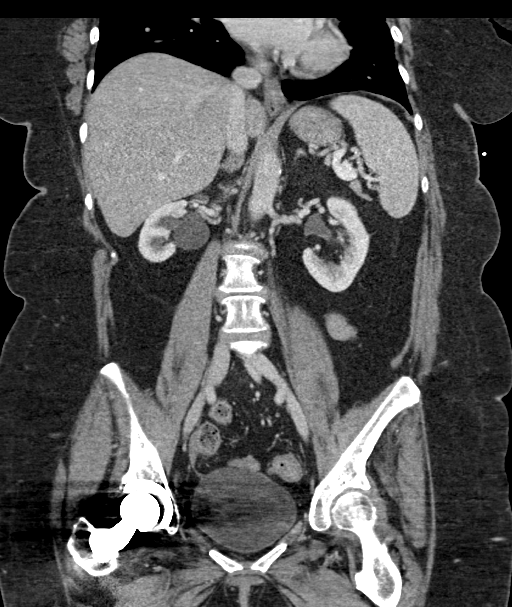

[14 of 46 positions shown; findings below may reference images not displayed]

FINDINGS: Lower chest: Atelectatic changes in the otherwise clear lung bases.
Stable 5 mm previously characterized benign nodule in the posterior
aspect left lower lobe ([DATE]). Normal heart size. No pericardial
effusion.

Hepatobiliary: Geographic hypoattenuation adjacent the falciform
ligament in the porta hepatis/gallbladder fossa compatible with
focal fatty infiltration. Otherwise normal liver attenuation for
contrast timing. Smooth liver surface contour. Normal gallbladder
and biliary tree.

Pancreas: Some stable mild prominence of the pancreatic duct with
normal distal tapering. No discernible pancreatic lesion. No
peripancreatic inflammation.

Spleen: Normal in size. No concerning splenic lesions.

Adrenals/Urinary Tract: Normal adrenal glands. Kidneys are normally
located with symmetric enhancement and excretion. Unchanged
appearance of what is likely a chronic right UPJ obstruction with
mild right hydronephrosis. No other urinary tract dilatation. No
concerning renal mass or urolithiasis. Urinary bladder is
unremarkable though the right lateral wall is partially obscured due
to streak artifact from a right hip prosthesis.

Stomach/Bowel: Distal esophagus, stomach and duodenal sweep are
unremarkable. No small bowel wall thickening or dilatation. No
evidence of obstruction. Appendix is not visualized. No focal
inflammation the vicinity of the cecum to suggest an occult
appendicitis. Proximal colon is fairly unremarkable. There is fairly
diffuse segmental thickening of the mid to distal colon is seen from
the distal transverse to rectosigmoid. A slightly more irregular
region of slightly more asymmetric thickening is seen in the distal
transverse colon (5/49, 2/49). Though this may reflect a focal
thickening of a proximal colonic diverticulum it is favored to be
secondary. Numerous additional colonic diverticula present. No
extraluminal gas or fluid collection. No pneumatosis or portal
venous gas.

Vascular/Lymphatic: No significant vascular findings are present. No
enlarged abdominal or pelvic lymph nodes.

Reproductive: Status post hysterectomy. No adnexal masses.

Other: No abdominal wall hernia or abnormality. No abdominopelvic
ascites. Small amount of subcutaneous stranding along the anterior
abdominal wall, possibly related to injectable use such as insulin.

Musculoskeletal: Prior total right hip arthroplasty in expected
alignment. Some mild degenerative changes throughout the spine,
slight dextrocurvature at the mid lumbar levels, apex L3. Additional
degenerative change seen in the left hip and bilateral SI joints. No
acute or worrisome osseous lesion.
IMPRESSION: 1. Fairly diffuse segmental thickening of the mid to distal colon is
seen from the distal transverse to rectosigmoid, consistent with
colitis, which may be infectious or inflammatory in etiology. No
evidence of perforation or abscess formation.
2. A slightly more irregular region of focal asymmetric thickening
in the distal transverse colon may inflammation of a broad-based
colonic diverticulum though favored to be secondary however
recommend correlation with colonoscopy on an outpatient basis to
exclude underlying lesion.
3. Chronic right UPJ obstruction with mild right hydronephrosis.
4. Small amount of subcutaneous stranding along the anterior
abdominal wall, possibly related to injectable use such as insulin.
5. Stable 5 mm solid nodule in the posterior aspect left lower lobe.
Unchanged and previously characterized as benign.
6. Aortic Atherosclerosis ([id number]).

## 2021-07-04 MED ORDER — OXYCODONE HCL 5 MG PO TABS: 5.0000 mg | ORAL_TABLET | Freq: Four times a day (QID) | ORAL | 0 refills | Status: DC | PRN

## 2021-07-04 MED ORDER — LIDOCAINE 5 % EX PTCH
1.0000 | MEDICATED_PATCH | CUTANEOUS | Status: DC
  Administered 2021-07-04: 1 via TRANSDERMAL
  Filled 2021-07-04: qty 1

## 2021-07-04 NOTE — ED Provider Notes (Signed)
?[facility] EMERGENCY DEPT ?Provider Note ? ? ?CSN: [id number] ?Arrival date & time: 07/04/21  1138 ? ?  ? ?History ? ?Chief Complaint  ?Patient presents with  ? Sciatica  ? ? ?[name] [name] is a 76 y.o. female. ? ?Patient here with right lower back pain with sciatic symptoms intermittently.  Started to experience symptoms this morning.  She had to have ultrasound done of her thyroid today and when she got up off the table she felt a muscle spasm.  She is got multiple drug allergies she states and states that oxycodone has helped the most in the past.  She is not allowed to take steroids due to other medications that she is on.  She denies any loss of bowel or bladder.  No weakness or numbness in her legs.  She has a history of anxiety, high cholesterol, migraines, diabetes. ? ? ? ?  ? ?Home Medications ?Prior to Admission medications   ?Medication Sig Start Date End Date Taking? Authorizing Provider  ?oxyCODONE (ROXICODONE) 5 MG immediate release tablet Take 1 tablet (5 mg total) by mouth every 6 (six) hours as needed for up to 10 doses for breakthrough pain. 07/04/21  Yes Geno Sydnor, DO  ?acetaminophen (TYLENOL) 325 MG tablet Take 2 tablets (650 mg total) by mouth every 6 (six) hours as needed. ?Patient taking differently: Take 650 mg by mouth every 6 (six) hours as needed for mild pain. 02/11/21   Jeanell Sparrow, DO  ?ALPRAZolam (XANAX) 0.5 MG tablet Take 1 tablet (0.5 mg total) by mouth 4 (four) times daily as needed for anxiety. 07/28/21   [name], [place]  ?atorvastatin (LIPITOR) 40 MG tablet Take 40 mg by mouth every evening.    [provider]  ?b complex vitamins capsule Take 1 capsule by mouth daily.    [provider]  ?BD PEN NEEDLE NANO 2ND GEN 32G X 4 MM MISC USE TO INJECT 5 TIMES DAILY AS DIRECTED 04/28/21   Elayne Snare, MD  ?cholecalciferol (VITAMIN D3) 25 MCG (1000 UNIT) tablet Take 1,000 Units by mouth daily. Also takes 2,000 units capsule at bedtime     [provider]  ?Cholecalciferol (VITAMIN D3) 50 MCG (2000 UT) TABS Take 2,000 Units by mouth at bedtime. Also takes 1,000 units capsule in the morning    [provider]  ?Cyanocobalamin (VITAMIN B-12 PO) Take 1 tablet by mouth daily.    [provider]  ?dicyclomine (BENTYL) 20 MG tablet Take 1 tablet (20 mg total) by mouth 3 (three) times daily as needed for spasms. 06/17/21   Shelly Coss, MD  ?divalproex (DEPAKOTE) 250 MG DR tablet Take 2 tablets (500 mg total) by mouth at bedtime. 07/03/21   [name], PMHNP  ?donepezil (ARICEPT) 10 MG tablet Take 1 tablet (10 mg total) by mouth at bedtime. 07/03/21   [name], PMHNP  ?gabapentin (NEURONTIN) 600 MG tablet Take 600 mg by mouth 3 (three) times daily. 05/12/19   [provider]  ?glucose blood (CONTOUR NEXT TEST) test strip USE TO TEST BLOOD SUGAR TWICE DAILY 06/20/20   Elayne Snare, MD  ?insulin lispro (HUMALOG KWIKPEN) 100 UNIT/ML KwikPen ADMINISTER 10 UNITS UNDER THE SKIN BEFORE MEALS ?Patient taking differently: Inject 10 Units into the skin 3 (three) times daily as needed (high blood sugar). 05/30/21   Elayne Snare, MD  ?Artis Flock SOLOSTAR 100 UNIT/ML Solostar Pen ADMINISTER 64 UNITS UNDER THE SKIN AT BEDTIME 06/18/21   Elayne Snare, MD  ?levothyroxine (SYNTHROID) 75 MCG  tablet TAKE 1 TABLET BY MOUTH EVERY DAY BEFORE BREAKFAST ?Patient taking differently: Take 75 mcg by mouth daily before breakfast. 03/27/21   Elayne Snare, MD  ?liothyronine (CYTOMEL) 5 MCG tablet TAKE 1 TABLET BY MOUTH DAILY WITH LEVOTHYROXINE. TAKE BEFORE BREAKFAST ?Patient taking differently: Take 5 mcg by mouth daily. 03/27/21   Elayne Snare, MD  ?lithium carbonate (ESKALITH) 450 MG CR tablet Take 1 tablet (450 mg total) by mouth at bedtime. 07/03/21   [name], PMHNP  ?losartan (COZAAR) 25 MG tablet Take 25 mg by mouth every evening. 08/31/19   [provider]  ?memantine (NAMENDA) 10 MG tablet TAKE 1 TABLET(10 MG) BY MOUTH EVERY EVENING  07/03/21   [name], PMHNP  ?metoCLOPramide (REGLAN) 5 MG tablet Take 1 tablet (5 mg total) by mouth every 6 (six) hours as needed for up to 15 days for nausea or vomiting. 06/17/21 07/02/21  Shelly Coss, MD  ?Microlet Lancets MISC TEST DAILY AS DIRECTED 06/20/20   Elayne Snare, MD  ?nitroGLYCERIN (NITROSTAT) 0.4 MG SL tablet Place 1 tablet (0.4 mg total) under the tongue every 5 (five) minutes as needed for chest pain. 09/14/17   Lyda Jester M, PA-C  ?ondansetron (ZOFRAN) 4 MG tablet Take 1 tablet (4 mg total) by mouth every 8 (eight) hours as needed for nausea or vomiting. 06/17/21   Shelly Coss, MD  ?oxyCODONE-acetaminophen (PERCOCET/ROXICET) 5-325 MG tablet Take 1 tablet by mouth every 6 (six) hours as needed for severe pain. 06/17/21   Shelly Coss, MD  ?QUEtiapine (SEROQUEL) 200 MG tablet TAKE 1 TAB PO q 5 pm and 1 tab po QHS 07/03/21   [name], PMHNP  ?rOPINIRole (REQUIP) 0.5 MG tablet Takes 4 tabs at 5 pm and 3 tabs at bedtime and 1-2 tabs prn 07/03/21   [name], PMHNP  ?tiZANidine (ZANAFLEX) 4 MG tablet Take 1 tablet (4 mg total) by mouth every 6 (six) hours as needed for muscle spasms. 30/86/57   Delora Fuel, MD  ?triamcinolone (NASACORT) 55 MCG/ACT AERO nasal inhaler Place 2 sprays into the nose daily as needed (allergies).    [provider]  ?vancomycin (VANCOCIN) 125 MG capsule Take 125 mg by mouth 2 (two) times daily. 06/30/21   [provider]  ?   ? ?Allergies    ?Codeine, Procaine hcl, Aspirin, Augmentin [amoxicillin-pot clavulanate], Benadryl [diphenhydramine], Diflucan [fluconazole], Dilaudid [hydromorphone hcl], Hydromorphone, Morphine, Other, Prednisone, Tramadol hcl, and Sulfa antibiotics   ? ?Review of Systems   ?Review of Systems ? ?Physical Exam ?Updated Vital Signs ?BP (!) 147/68 (BP Location: Right Arm)   Pulse 85   Temp 98.2 ?F (36.8 ?C) (Oral)   Resp 16   Ht '5\' 1"'$  (1.549 m)   Wt 79.4 kg   SpO2 94%   BMI 33.07 kg/m?  ?Physical  Exam ?Vitals and nursing note reviewed.  ?Constitutional:   ?   General: She is not in acute distress. ?   Appearance: She is well-developed.  ?HENT:  ?   Head: Normocephalic and atraumatic.  ?Eyes:  ?   Conjunctiva/sclera: Conjunctivae normal.  ?Cardiovascular:  ?   Rate and Rhythm: Normal rate and regular rhythm.  ?   Pulses: Normal pulses.  ?   Heart sounds: No murmur heard. ?Pulmonary:  ?   Effort: Pulmonary effort is normal. No respiratory distress.  ?   Breath sounds: Normal breath sounds.  ?Abdominal:  ?   Palpations: Abdomen is soft.  ?   Tenderness: There is no abdominal tenderness.  ?  Musculoskeletal:     ?   General: Tenderness present. No swelling.  ?   Cervical back: Neck supple.  ?   Comments: Tenderness to the paraspinal lumbar muscles on the right and gluteal muscles on the right  ?Skin: ?   General: Skin is warm and dry.  ?   Capillary Refill: Capillary refill takes less than 2 seconds.  ?Neurological:  ?   General: No focal deficit present.  ?   Mental Status: She is alert and oriented to person, place, and time.  ?   Cranial Nerves: No cranial nerve deficit.  ?   Sensory: No sensory deficit.  ?   Motor: No weakness.  ?   Coordination: Coordination normal.  ?Psychiatric:     ?   Mood and Affect: Mood normal.  ? ? ?ED Results / Procedures / Treatments   ?Labs ?(all labs ordered are listed, but only abnormal results are displayed) ?Labs Reviewed - No data to display ? ?EKG ?None ? ?Radiology ?No results found. ? ?Procedures ?Procedures  ? ? ?Medications Ordered in ED ?Medications  ?lidocaine (LIDODERM) 5 % 1 patch (has no administration in time range)  ? ? ?ED Course/ Medical Decision Making/ A&P ?  ?                        ?Medical Decision Making ?Risk ?Prescription drug management. ? ? ?[name] is here with sciatic symptoms.  History of the same.  History of hypertension, diabetes, anxiety, chronic back pain.  Multiple drug allergies.  Static symptoms started today after having to get up  off the table after getting ultrasound of her thyroid done.  She is hesitant to use steroids and over-the-counter medications have not helped.  She states that she usually gets narcotic pain medicine for any of these

## 2021-07-04 NOTE — ED Triage Notes (Signed)
Patient reports to the ER for right sided back and leg pain. Patient reports she could not get off the table. Patient reports she has been having worsening sharp shooting pain.  ?

## 2021-07-04 NOTE — Discharge Instructions (Signed)
Recommend taking Tylenol and ibuprofen as needed for pain if you are allowed to.  I have written you for oxycodone which is a narcotic pain medicine for breakthrough pain.  Follow-up with your primary care doctor for further pain management.  Recommend that you consider purchasing over-the-counter lidocaine patches for your pain as well. ?

## 2021-07-06 ENCOUNTER — Emergency Department (HOSPITAL_BASED_OUTPATIENT_CLINIC_OR_DEPARTMENT_OTHER)
Admission: EM | Admit: 2021-07-06 | Discharge: 2021-07-06 | Disposition: A | Discharge status: Home / Self Care | Payer: Medicare Other | Attending: Emergency Medicine | Admitting: Emergency Medicine

## 2021-07-06 ENCOUNTER — Emergency Department (HOSPITAL_BASED_OUTPATIENT_CLINIC_OR_DEPARTMENT_OTHER): Payer: Medicare Other

## 2021-07-06 ENCOUNTER — Other Ambulatory Visit: Payer: Self-pay

## 2021-07-06 ENCOUNTER — Encounter (HOSPITAL_BASED_OUTPATIENT_CLINIC_OR_DEPARTMENT_OTHER): Payer: Self-pay | Admitting: Emergency Medicine

## 2021-07-06 DIAGNOSIS — E119 Type 2 diabetes mellitus without complications: Secondary | ICD-10-CM | POA: Diagnosis not present

## 2021-07-06 DIAGNOSIS — I10 Essential (primary) hypertension: Secondary | ICD-10-CM | POA: Diagnosis not present

## 2021-07-06 DIAGNOSIS — Z79899 Other long term (current) drug therapy: Secondary | ICD-10-CM | POA: Diagnosis not present

## 2021-07-06 DIAGNOSIS — R600 Localized edema: Secondary | ICD-10-CM | POA: Insufficient documentation

## 2021-07-06 DIAGNOSIS — Z794 Long term (current) use of insulin: Secondary | ICD-10-CM | POA: Diagnosis not present

## 2021-07-06 DIAGNOSIS — M5441 Lumbago with sciatica, right side: Secondary | ICD-10-CM | POA: Diagnosis not present

## 2021-07-06 DIAGNOSIS — M5442 Lumbago with sciatica, left side: Secondary | ICD-10-CM | POA: Diagnosis not present

## 2021-07-06 DIAGNOSIS — M7989 Other specified soft tissue disorders: Secondary | ICD-10-CM

## 2021-07-06 DIAGNOSIS — E039 Hypothyroidism, unspecified: Secondary | ICD-10-CM | POA: Diagnosis not present

## 2021-07-06 LAB — CBC WITH DIFFERENTIAL/PLATELET
Abs Immature Granulocytes: 0.05 10*3/uL (ref 0.00–0.07)
Basophils Absolute: 0 10*3/uL (ref 0.0–0.1)
Basophils Relative: 1 %
Eosinophils Absolute: 0.3 10*3/uL (ref 0.0–0.5)
Eosinophils Relative: 4 %
HCT: 43 % (ref 36.0–46.0)
Hemoglobin: 13.5 g/dL (ref 12.0–15.0)
Immature Granulocytes: 1 %
Lymphocytes Relative: 23 %
Lymphs Abs: 1.8 10*3/uL (ref 0.7–4.0)
MCH: 28.9 pg (ref 26.0–34.0)
MCHC: 31.4 g/dL (ref 30.0–36.0)
MCV: 92.1 fL (ref 80.0–100.0)
Monocytes Absolute: 0.5 10*3/uL (ref 0.1–1.0)
Monocytes Relative: 6 %
Neutro Abs: 5.4 10*3/uL (ref 1.7–7.7)
Neutrophils Relative %: 65 %
Platelets: 219 10*3/uL (ref 150–400)
RBC: 4.67 MIL/uL (ref 3.87–5.11)
RDW: 14.1 % (ref 11.5–15.5)
WBC: 8.1 10*3/uL (ref 4.0–10.5)
nRBC: 0 % (ref 0.0–0.2)

## 2021-07-06 LAB — BASIC METABOLIC PANEL
Anion gap: 10 (ref 5–15)
BUN: 10 mg/dL (ref 8–23)
CO2: 27 mmol/L (ref 22–32)
Calcium: 10.7 mg/dL — ABNORMAL HIGH (ref 8.9–10.3)
Chloride: 101 mmol/L (ref 98–111)
Creatinine, Ser: 0.76 mg/dL (ref 0.44–1.00)
GFR, Estimated: >60 mL/min (ref >60)
Glucose, Bld: 123 mg/dL — ABNORMAL HIGH (ref 70–99)
Potassium: 4.1 mmol/L (ref 3.5–5.1)
Sodium: 138 mmol/L (ref 135–145)

## 2021-07-06 LAB — TROPONIN I (HIGH SENSITIVITY): Troponin I (High Sensitivity): 3 ng/L (ref <18)

## 2021-07-06 LAB — T4, FREE: Free T4: 0.68 ng/dL (ref 0.61–1.12)

## 2021-07-06 LAB — BRAIN NATRIURETIC PEPTIDE: B Natriuretic Peptide: 8.8 pg/mL (ref 0.0–100.0)

## 2021-07-06 LAB — TSH: TSH: 0.925 u[IU]/mL (ref 0.350–4.500)

## 2021-07-06 MED ORDER — FUROSEMIDE 20 MG PO TABS: 20.0000 mg | ORAL_TABLET | Freq: Every day | ORAL | 0 refills | Status: DC

## 2021-07-06 MED ORDER — FUROSEMIDE 10 MG/ML IJ SOLN
20.0000 mg | Freq: Once | INTRAMUSCULAR | Status: AC
  Administered 2021-07-06: 20 mg via INTRAVENOUS
  Filled 2021-07-06: qty 2

## 2021-07-06 NOTE — ED Provider Notes (Signed)
[facility] EMERGENCY DEPT Provider Note   CSN: [id number] Arrival date & time: 07/06/21  1225     History  No chief complaint on file.   [name] [name] is a 76 y.o. female.  HPI  75 year old female with medical history significant for anxiety, urinary incontinence, HLD, ischemic colitis, hepatic steatosis, fibromyalgia, IBS, depression, bipolar 1 disorder, HTN, hypothyroidism, HLD, DM 2, SLE, who presents emergency department with bilateral lower extremity swelling.  The patient states that she has had bilateral ankle pain and swelling for the past few days.  She is unsure of how much weight she has gained.  She is not currently taking daily Lasix.  She does have a history of arthritis.  She denies any fevers or chills.  She denies any chest pain or shortness of breath.  She denies any posterior calf pain or swelling.  Home Medications Prior to Admission medications   Medication Sig Start Date End Date Taking? Authorizing Provider  furosemide (LASIX) 20 MG tablet Take 1 tablet (20 mg total) by mouth daily for 10 days. 07/06/21 07/16/21 Yes Ernie Avena, MD  acetaminophen (TYLENOL) 325 MG tablet Take 2 tablets (650 mg total) by mouth every 6 (six) hours as needed. Patient taking differently: Take 650 mg by mouth every 6 (six) hours as needed for mild pain. 02/11/21   Sloan Leiter, DO  ALPRAZolam Prudy Feeler) 0.5 MG tablet Take 1 tablet (0.5 mg total) by mouth 4 (four) times daily as needed for anxiety. 07/28/21   Corie Chiquito, PMHNP  atorvastatin (LIPITOR) 40 MG tablet Take 40 mg by mouth every evening.    [provider]  b complex vitamins capsule Take 1 capsule by mouth daily.    [provider]  BD PEN NEEDLE NANO 2ND GEN 32G X 4 MM MISC USE TO INJECT 5 TIMES DAILY AS DIRECTED 04/28/21   Reather Littler, MD  cholecalciferol (VITAMIN D3) 25 MCG (1000 UNIT) tablet Take 1,000 Units by mouth daily. Also takes 2,000 units capsule at bedtime    [provider]  Cholecalciferol (VITAMIN D3) 50 MCG (2000 UT) TABS Take 2,000 Units by mouth at bedtime. Also takes 1,000 units capsule in the morning    [provider]  Cyanocobalamin (VITAMIN B-12 PO) Take 1 tablet by mouth daily.    [provider]  dicyclomine (BENTYL) 20 MG tablet Take 1 tablet (20 mg total) by mouth 3 (three) times daily as needed for spasms. 06/17/21   Burnadette Pop, MD  divalproex (DEPAKOTE) 250 MG DR tablet Take 2 tablets (500 mg total) by mouth at bedtime. 07/03/21   Corie Chiquito, PMHNP  donepezil (ARICEPT) 10 MG tablet Take 1 tablet (10 mg total) by mouth at bedtime. 07/03/21   Corie Chiquito, PMHNP  gabapentin (NEURONTIN) 600 MG tablet Take 600 mg by mouth 3 (three) times daily. 05/12/19   [provider]  glucose blood (CONTOUR NEXT TEST) test strip USE TO TEST BLOOD SUGAR TWICE DAILY 06/20/20   Reather Littler, MD  insulin lispro (HUMALOG KWIKPEN) 100 UNIT/ML KwikPen ADMINISTER 10 UNITS UNDER THE SKIN BEFORE MEALS Patient taking differently: Inject 10 Units into the skin 3 (three) times daily as needed (high blood sugar). 05/30/21   Reather Littler, MD  LANTUS SOLOSTAR 100 UNIT/ML Solostar Pen ADMINISTER 64 UNITS UNDER THE SKIN AT BEDTIME 06/18/21   Reather Littler, MD  levothyroxine (SYNTHROID) 75 MCG tablet TAKE 1 TABLET BY MOUTH EVERY DAY BEFORE BREAKFAST Patient taking differently: Take 75 mcg by mouth daily  before breakfast. 03/27/21   Reather Littler, MD  liothyronine (CYTOMEL) 5 MCG tablet TAKE 1 TABLET BY MOUTH DAILY WITH LEVOTHYROXINE. TAKE BEFORE BREAKFAST Patient taking differently: Take 5 mcg by mouth daily. 03/27/21   Reather Littler, MD  lithium carbonate (ESKALITH) 450 MG CR tablet Take 1 tablet (450 mg total) by mouth at bedtime. 07/03/21   Corie Chiquito, PMHNP  losartan (COZAAR) 25 MG tablet Take 25 mg by mouth every evening. 08/31/19   [provider]  memantine (NAMENDA) 10 MG tablet TAKE 1 TABLET(10 MG) BY MOUTH EVERY EVENING 07/03/21    Corie Chiquito, PMHNP  metoCLOPramide (REGLAN) 5 MG tablet Take 1 tablet (5 mg total) by mouth every 6 (six) hours as needed for up to 15 days for nausea or vomiting. 06/17/21 07/02/21  Burnadette Pop, MD  Microlet Lancets MISC TEST DAILY AS DIRECTED 06/20/20   Reather Littler, MD  nitroGLYCERIN (NITROSTAT) 0.4 MG SL tablet Place 1 tablet (0.4 mg total) under the tongue every 5 (five) minutes as needed for chest pain. 09/14/17   Robbie Lis M, PA-C  ondansetron (ZOFRAN) 4 MG tablet Take 1 tablet (4 mg total) by mouth every 8 (eight) hours as needed for nausea or vomiting. 06/17/21   Burnadette Pop, MD  oxyCODONE (ROXICODONE) 5 MG immediate release tablet Take 1 tablet (5 mg total) by mouth every 6 (six) hours as needed for up to 10 doses for breakthrough pain. 07/04/21   Curatolo, Adam, DO  oxyCODONE-acetaminophen (PERCOCET/ROXICET) 5-325 MG tablet Take 1 tablet by mouth every 6 (six) hours as needed for severe pain. 06/17/21   Burnadette Pop, MD  QUEtiapine (SEROQUEL) 200 MG tablet TAKE 1 TAB PO q 5 pm and 1 tab po QHS 07/03/21   Corie Chiquito, PMHNP  rOPINIRole (REQUIP) 0.5 MG tablet Takes 4 tabs at 5 pm and 3 tabs at bedtime and 1-2 tabs prn 07/03/21   Corie Chiquito, PMHNP  tiZANidine (ZANAFLEX) 4 MG tablet Take 1 tablet (4 mg total) by mouth every 6 (six) hours as needed for muscle spasms. 02/19/21   Dione Booze, MD  triamcinolone (NASACORT) 55 MCG/ACT AERO nasal inhaler Place 2 sprays into the nose daily as needed (allergies).    [provider]  vancomycin (VANCOCIN) 125 MG capsule Take 125 mg by mouth 2 (two) times daily. 06/30/21   [provider]      Allergies    Codeine, Procaine hcl, Aspirin, Augmentin [amoxicillin-pot clavulanate], Benadryl [diphenhydramine], Diflucan [fluconazole], Dilaudid [hydromorphone hcl], Hydromorphone, Morphine, Other, Prednisone, Tramadol hcl, and Sulfa antibiotics    Review of Systems   Review of Systems  All other systems reviewed and are  negative.  Physical Exam Updated Vital Signs BP 140/74 (BP Location: Right Arm)   Pulse 81   Temp 98.3 F (36.8 C) (Oral)   Resp 17   Ht 5\' 1"  (1.549 m)   Wt 79.4 kg   SpO2 99%   BMI 33.07 kg/m  Physical Exam Vitals and nursing note reviewed.  Constitutional:      General: She is not in acute distress.    Appearance: She is well-developed.  HENT:     Head: Normocephalic and atraumatic.  Eyes:     Conjunctiva/sclera: Conjunctivae normal.  Cardiovascular:     Rate and Rhythm: Normal rate and regular rhythm.  Pulmonary:     Effort: Pulmonary effort is normal. No respiratory distress.     Breath sounds: Normal breath sounds.  Abdominal:     Palpations: Abdomen is soft.  Tenderness: There is no abdominal tenderness.  Musculoskeletal:     Cervical back: Neck supple.     Right lower leg: Edema present.     Left lower leg: Edema present.     Comments: Trace pitting edema bilaterally, no asymmetry noted, no Homans' sign, no tenderness of the ankle joints bilaterally with good intact range of motion, 2+ DP pulses, no warmth or erythema noted.  Skin:    General: Skin is warm and dry.     Capillary Refill: Capillary refill takes less than 2 seconds.  Neurological:     Mental Status: She is alert.  Psychiatric:        Mood and Affect: Mood normal.    ED Results / Procedures / Treatments   Labs (all labs ordered are listed, but only abnormal results are displayed) Labs Reviewed  BASIC METABOLIC PANEL - Abnormal; Notable for the following components:      Result Value   Glucose, Bld 123 (*)    Calcium 10.7 (*)    All other components within normal limits  CBC WITH DIFFERENTIAL/PLATELET  BRAIN NATRIURETIC PEPTIDE  TSH  T4, FREE  TROPONIN I (HIGH SENSITIVITY)    EKG EKG Interpretation  Date/Time:  Sunday July 06 2021 14:47:09 EDT Ventricular Rate:  78 PR Interval:  189 QRS Duration: 89 QT Interval:  394 QTC Calculation: 449 R Axis:   37 Text  Interpretation: Sinus rhythm Low voltage, precordial leads Confirmed by Ernie Avena ([id number]) on 07/06/2021 3:52:37 PM  Radiology US Venous Img Lower Unilateral Left (DVT)  Result Date: 07/09/2021 CLINICAL DATA:  Left leg pain for 1 week EXAM: LEFT LOWER EXTREMITY VENOUS DOPPLER ULTRASOUND TECHNIQUE: Gray-scale sonography with graded compression, as well as color Doppler and duplex ultrasound were performed to evaluate the lower extremity deep venous systems from the level of the common femoral vein and including the common femoral, femoral, profunda femoral, popliteal and calf veins including the posterior tibial, peroneal and gastrocnemius veins when visible. The superficial great saphenous vein was also interrogated. Spectral Doppler was utilized to evaluate flow at rest and with distal augmentation maneuvers in the common femoral, femoral and popliteal veins. COMPARISON:  None Available. FINDINGS: Contralateral Common Femoral Vein: Respiratory phasicity is normal and symmetric with the symptomatic side. No evidence of thrombus. Normal compressibility. Common Femoral Vein: No evidence of thrombus. Normal compressibility, respiratory phasicity and response to augmentation. Saphenofemoral Junction: No evidence of thrombus. Normal compressibility and flow on color Doppler imaging. Profunda Femoral Vein: No evidence of thrombus. Normal compressibility and flow on color Doppler imaging. Femoral Vein: No evidence of thrombus. Normal compressibility, respiratory phasicity and response to augmentation. Popliteal Vein: No evidence of thrombus. Normal compressibility, respiratory phasicity and response to augmentation. Calf Veins: No evidence of thrombus. Normal compressibility and flow on color Doppler imaging. Superficial Great Saphenous Vein: No evidence of thrombus. Normal compressibility. IMPRESSION: No evidence of deep venous thrombosis. Electronically Signed   By: Judie Petit.  Shick M.D.   On: 07/09/2021 11:03     Procedures Procedures    Medications Ordered in ED Medications  furosemide (LASIX) injection 20 mg (20 mg Intravenous Given 07/06/21 1452)    ED Course/ Medical Decision Making/ A&P                           Medical Decision Making Amount and/or Complexity of Data Reviewed Labs: ordered. Radiology: ordered.  Risk Prescription drug management.   76 year old female with medical history significant for  anxiety, urinary incontinence, HLD, ischemic colitis, hepatic steatosis, fibromyalgia, IBS, depression, bipolar 1 disorder, HTN, hypothyroidism, HLD, DM 2, SLE, who presents emergency department with bilateral lower extremity swelling.  The patient states that she has had bilateral ankle pain and swelling for the past few days.  She is unsure of how much weight she has gained.  She is not currently taking daily Lasix.  She does have a history of arthritis.  She denies any fevers or chills.  She denies any chest pain or shortness of breath.  She denies any posterior calf pain or swelling.  On arrival, the patient was afebrile, hemodynamically stable, mildly hypertensive BP 150/132, saturating 95% on room air.  Sinus rhythm noted on cardiac telemetry.  Physical exam significant for Slight trace bilateral pitting edema.  No chest pain or shortness of breath.  Lungs were clear to auscultation bilaterally and the patient had no JVD.  No ankle tenderness or pain with range of motion, no erythema.  No concern for septic arthritis or gout.  No evidence for cellulitis.  Slight edema bilaterally without asymmetry.  Do not believe DVT ultrasound is warranted at this time.  Will evaluate the patient further for possible CHF.  Also will send thyroid function studies.  EKG was performed which revealed sinus rhythm, ventricular rate 78, low voltage, no ST segment changes or abnormal intervals.  Chest x-ray was performed, reviewed by myself and radiology negative for  Laboratory work-up significant  for leukocytosis or anemia, BMP significant abnormality, no evidence of renal dysfunction, no significant electrolyte abnormality, mild anemia to 10.7.  Patient with.  TSH and free T4 were normal.  BNP and troponin were also normal.  The patient remains without chest pain or shortness of breath.  He was administered a dose of IV Lasix in the emergency department.  We will start on a short course of Lasix consistently and have her follow-up with her PCP for repeat assessment.   Final Clinical Impression(s) / ED Diagnoses Final diagnoses:  Bilateral lower extremity edema    Rx / DC Orders ED Discharge Orders          Ordered    furosemide (LASIX) 20 MG tablet  Daily        07/06/21 1553              Ernie Avena, MD 07/09/21 2343

## 2021-07-06 NOTE — ED Notes (Signed)
Dc instructions and scripts reviewed with pt no questions or concerns at this time will follow up with pcp in 3 days. Pt declined wheelchair and used cane to exit emergency department.  ?

## 2021-07-06 NOTE — ED Triage Notes (Signed)
Bilateral ankle pain and edema , left worse than right . Denies chest pain nor shortness of breath . Took Lasix last night and this morning , yet not on it all the time .  ?Hx arthritis  ?

## 2021-07-06 NOTE — Discharge Instructions (Addendum)
You were evaluated in the Emergency Department and after careful evaluation, we did not find any emergent condition requiring admission or further testing in the hospital. ? ?Your exam/testing today was overall reassuring.  Your EKG and chest x-ray and screening laboratory work-up was reassuring.  We will start you on a short course of Lasix and have you follow-up with your PCP for reassessment. ? ?Please return to the Emergency Department if you experience any worsening of your condition.  Thank you for allowing [place] to be a part of your care. ? ?

## 2021-07-09 ENCOUNTER — Other Ambulatory Visit: Payer: Medicare Other

## 2021-07-09 ENCOUNTER — Other Ambulatory Visit (HOSPITAL_BASED_OUTPATIENT_CLINIC_OR_DEPARTMENT_OTHER): Payer: Self-pay | Admitting: Family Medicine

## 2021-07-09 ENCOUNTER — Ambulatory Visit (HOSPITAL_BASED_OUTPATIENT_CLINIC_OR_DEPARTMENT_OTHER)
Admission: RE | Admit: 2021-07-09 | Discharge: 2021-07-09 | Disposition: A | Discharge status: Home / Self Care | Payer: Medicare Other | Source: Ambulatory Visit | Attending: Family Medicine | Admitting: Family Medicine

## 2021-07-09 DIAGNOSIS — M79605 Pain in left leg: Secondary | ICD-10-CM | POA: Insufficient documentation

## 2021-07-09 DIAGNOSIS — R609 Edema, unspecified: Secondary | ICD-10-CM | POA: Diagnosis not present

## 2021-07-09 DIAGNOSIS — M5442 Lumbago with sciatica, left side: Secondary | ICD-10-CM | POA: Diagnosis not present

## 2021-07-16 ENCOUNTER — Ambulatory Visit (INDEPENDENT_AMBULATORY_CARE_PROVIDER_SITE_OTHER): Payer: Medicare Other | Admitting: Endocrinology

## 2021-07-16 ENCOUNTER — Encounter: Payer: Self-pay | Admitting: Endocrinology

## 2021-07-16 VITALS — BP 144/84 | HR 79 | Ht 61.0 in | Wt 178.0 lb

## 2021-07-16 DIAGNOSIS — E1165 Type 2 diabetes mellitus with hyperglycemia: Secondary | ICD-10-CM

## 2021-07-16 DIAGNOSIS — Z794 Long term (current) use of insulin: Secondary | ICD-10-CM

## 2021-07-16 LAB — POCT GLYCOSYLATED HEMOGLOBIN (HGB A1C): Hemoglobin A1C: 6.7 % — AB (ref 4.0–5.6)

## 2021-07-16 NOTE — Patient Instructions (Signed)
Take 10 Humalog at dinner ? ?Check blood sugars on waking up 2 days a week ? ?Also check blood sugars about 2 hours after meals and do this after different meals by rotation ? ?Recommended blood sugar levels on waking up are 90-130 and about 2 hours after meal is 130-180 ? ?Please bring your blood sugar monitor to each visit, thank you ? ?

## 2021-07-16 NOTE — Progress Notes (Signed)
Patient ID: [name] [name], female   DOB: 06-Aug-1945, 76 y.o.   MRN: [id number]  ? ?       ? ? ?Reason for Appointment: Follow-up  ? ? ?History of Present Illness:  ?        ?Date of diagnosis of type 2 diabetes mellitus: ?  2014       ? ?Background history:  ? ?She thinks her blood sugar was 400 at the time of diagnosis but no detailed records of this are available ?She did have an A1c of 10.6 done in 2014 and was probably given Lantus for some time initially ?Apparently she was treated with various medications including metformin, Janumet and Tradgenta. ?Her blood sugars had improved and A1c in 2015 was down to 6.2 ?She tends to have diarrhea with metformin and Janumet and also she thinks it causes dry mouth ?Most likely Janumet was stopped in 06/2015 ?Glipizide was started in 8/17 when blood sugars were higher and A1c was 9% ? ?Recent history:  ? ?INSULIN regimen is:   Lantus 66 units daily at 5 pm, NovoLog 5 to 10 units irregularly ? ?Non-insulin hypoglycemic drugs the patient is taking are: None ? ?Her A1c is improved at 6.7 compared to 7.7 ? ? ? ? ?Current management, blood sugar patterns and problems identified: ? ?She has had overall better control with adding some Humalog although difficult to assess her blood sugars since she is mostly monitoring before meals and use regularly  ?She was supposed to take Humalog with every meal based on meal size but now she says that she is only taking it if she has checked her sugar and will take 5 to 10 units based on her blood sugar level  ?Last evening he had at least 2 starches at dinnertime and did not take any coverage ?Frequently eating desserts although she thinks these are low-fat  ?Highest blood sugar was after lunch from eating dessert  ?Her morning sugars fluctuate significantly and may be as low as 117 but occasionally higher and generally higher in the last few days  ?Not taking Ozempic because of reportedly having gastroparesis  ?She is not able to do much  exercise ?Her weight is about the same, previously had lost some after illness and surgery ?She was told to try and get the freestyle libre but was not able to get the paperwork completed or had some delay ? ? ?Side effects from medications have been:?  Diarrhea from metformin and Janumet ? ?Glucose monitoring:  done 1 time a day or less        Glucometer: Contour   ?     ?Blood Glucose readings  ? ? ? ?PRE-MEAL Fasting Lunch Dinner Bedtime Overall  ?Glucose range: 117-211    86-227  ?Mean/median: 176  160  737  ? ?POST-MEAL PC Breakfast PC Lunch PC Dinner  ?Glucose range:   ?  ?Mean/median:     ? ?Previously ? ?PRE-MEAL Overnight Lunch Dinner Bedtime Overall  ?Glucose range: 145-176  87-176  87-268  ?Mean/median:     153  ? ?POST-MEAL PC Breakfast PC Lunch PC Dinner  ?Glucose range:  150-268 163, 180  ?Mean/median:     ? ? ?Typical meal intake: Breakfast at 9,  May be eggs and sausage and lunch is usually a sandwich   ?Dinner 7 pm              ? ?Dietician visit, most recent: 10/18 ?              ? ?  Weight history:  ? ?Wt Readings from Last 3 Encounters:  ?07/16/21 178 lb (80.7 kg)  ?07/06/21 175 lb (79.4 kg)  ?07/04/21 175 lb (79.4 kg)  ? ? ?Glycemic control: ?  ?Lab Results  ?Component Value Date  ? HGBA1C 6.7 (A) 07/16/2021  ? HGBA1C 7.7 (H) 03/25/2021  ? HGBA1C 6.8 (H) 12/24/2020  ? ?Lab Results  ?Component Value Date  ? MICROALBUR <0.7 08/09/2020  ? [place] 79 08/09/2020  ? CREATININE 0.76 07/06/2021  ? ?Lab Results  ?Component Value Date  ? MICRALBCREAT 1.1 08/09/2020  ? ? ? ? ? ?Allergies as of 07/16/2021   ? ?   Reactions  ? Codeine Anxiety, Other (See Comments)  ? Hallucinations, tolerates oxycodone   ? Procaine Hcl Palpitations  ? Aspirin Nausea And Vomiting, Other (See Comments)  ? Reaction:  Burns pts stomach   ? Augmentin [amoxicillin-pot Clavulanate] Diarrhea  ? Benadryl [diphenhydramine] Other (See Comments)  ? Per MD "inhibits potency of gabapentin, lithium etc"  ? Diflucan [fluconazole] Other (See  Comments)  ? Unknown reaction  ? Dilaudid [hydromorphone Hcl] Other (See Comments)  ? Migraines and nightmares   ? Hydromorphone Other (See Comments)  ? Morphine Other (See Comments)  ? headache  ? Other Nausea And Vomiting, Other (See Comments)  ? Pt states that all -mycins cause N/V. (MACROLIDES)   ? Prednisone   ? Long term steroid problems with lithium and blood sugar.   ? Tramadol Hcl   ? Other reaction(s): felt weird  ? Sulfa Antibiotics Other (See Comments)  ? Unknown reaction  ? ?  ? ?  ?Medication List  ?  ? ?  ? Accurate as of Jul 16, 2021  1:36 PM. If you have any questions, ask your nurse or doctor.  ?  ?  ? ?  ? ?acetaminophen 325 MG tablet ?Commonly known as: Tylenol ?Take 2 tablets (650 mg total) by mouth every 6 (six) hours as needed. ?What changed: reasons to take this ?  ?ALPRAZolam 0.5 MG tablet ?Commonly known as: Duanne Moron ?Take 1 tablet (0.5 mg total) by mouth 4 (four) times daily as needed for anxiety. ?Start taking on: Jul 28, 2021 ?  ?atorvastatin 40 MG tablet ?Commonly known as: LIPITOR ?Take 40 mg by mouth every evening. ?  ?b complex vitamins capsule ?Take 1 capsule by mouth daily. ?  ?BD Pen Needle Nano 2nd Gen 32G X 4 MM Misc ?Generic drug: Insulin Pen Needle ?USE TO INJECT 5 TIMES DAILY AS DIRECTED ?  ?cholecalciferol 25 MCG (1000 UNIT) tablet ?Commonly known as: VITAMIN D3 ?Take 1,000 Units by mouth daily. Also takes 2,000 units capsule at bedtime ?  ?Vitamin D3 50 MCG (2000 UT) Tabs ?Take 2,000 Units by mouth at bedtime. Also takes 1,000 units capsule in the morning ?  ?Contour Next Test test strip ?Generic drug: glucose blood ?USE TO TEST BLOOD SUGAR TWICE DAILY ?  ?dicyclomine 20 MG tablet ?Commonly known as: BENTYL ?Take 1 tablet (20 mg total) by mouth 3 (three) times daily as needed for spasms. ?  ?divalproex 250 MG DR tablet ?Commonly known as: DEPAKOTE ?Take 2 tablets (500 mg total) by mouth at bedtime. ?  ?donepezil 10 MG tablet ?Commonly known as: ARICEPT ?Take 1 tablet (10 mg  total) by mouth at bedtime. ?  ?furosemide 20 MG tablet ?Commonly known as: LASIX ?Take 1 tablet (20 mg total) by mouth daily for 10 days. ?  ?gabapentin 600 MG tablet ?Commonly known as: NEURONTIN ?Take 600 mg by  mouth 3 (three) times daily. ?  ?insulin lispro 100 UNIT/ML KwikPen ?Commonly known as: HumaLOG KwikPen ?ADMINISTER 10 UNITS UNDER THE SKIN BEFORE MEALS ?What changed:  ?how much to take ?how to take this ?when to take this ?reasons to take this ?additional instructions ?  ?Lantus SoloStar 100 UNIT/ML Solostar Pen ?Generic drug: insulin glargine ?ADMINISTER 64 UNITS UNDER THE SKIN AT BEDTIME ?What changed: See the new instructions. ?  ?levothyroxine 75 MCG tablet ?Commonly known as: SYNTHROID ?TAKE 1 TABLET BY MOUTH EVERY DAY BEFORE BREAKFAST ?What changed: See the new instructions. ?  ?liothyronine 5 MCG tablet ?Commonly known as: CYTOMEL ?TAKE 1 TABLET BY MOUTH DAILY WITH LEVOTHYROXINE. TAKE BEFORE BREAKFAST ?What changed: See the new instructions. ?  ?lithium carbonate 450 MG CR tablet ?Commonly known as: ESKALITH ?Take 1 tablet (450 mg total) by mouth at bedtime. ?  ?losartan 25 MG tablet ?Commonly known as: COZAAR ?Take 25 mg by mouth every evening. ?  ?memantine 10 MG tablet ?Commonly known as: NAMENDA ?TAKE 1 TABLET(10 MG) BY MOUTH EVERY EVENING ?  ?metoCLOPramide 5 MG tablet ?Commonly known as: REGLAN ?Take 1 tablet (5 mg total) by mouth every 6 (six) hours as needed for up to 15 days for nausea or vomiting. ?  ?Microlet Lancets Misc ?TEST DAILY AS DIRECTED ?  ?nitroGLYCERIN 0.4 MG SL tablet ?Commonly known as: NITROSTAT ?Place 1 tablet (0.4 mg total) under the tongue every 5 (five) minutes as needed for chest pain. ?  ?ondansetron 4 MG tablet ?Commonly known as: ZOFRAN ?Take 1 tablet (4 mg total) by mouth every 8 (eight) hours as needed for nausea or vomiting. ?  ?oxyCODONE 5 MG immediate release tablet ?Commonly known as: Roxicodone ?Take 1 tablet (5 mg total) by mouth every 6 (six) hours as  needed for up to 10 doses for breakthrough pain. ?  ?oxyCODONE-acetaminophen 5-325 MG tablet ?Commonly known as: PERCOCET/ROXICET ?Take 1 tablet by mouth every 6 (six) hours as needed for severe pain. ?  ?Q

## 2021-07-22 ENCOUNTER — Ambulatory Visit
Admission: RE | Admit: 2021-07-22 | Discharge: 2021-07-22 | Disposition: A | Discharge status: Home / Self Care | Payer: Medicare Other | Source: Ambulatory Visit | Attending: Family Medicine | Admitting: Family Medicine

## 2021-07-22 DIAGNOSIS — E2839 Other primary ovarian failure: Secondary | ICD-10-CM

## 2021-07-22 DIAGNOSIS — Z1231 Encounter for screening mammogram for malignant neoplasm of breast: Secondary | ICD-10-CM | POA: Diagnosis not present

## 2021-07-22 DIAGNOSIS — M8589 Other specified disorders of bone density and structure, multiple sites: Secondary | ICD-10-CM | POA: Diagnosis not present

## 2021-07-22 DIAGNOSIS — Z780 Asymptomatic menopausal state: Secondary | ICD-10-CM | POA: Diagnosis not present

## 2021-07-23 ENCOUNTER — Emergency Department (HOSPITAL_BASED_OUTPATIENT_CLINIC_OR_DEPARTMENT_OTHER)
Admission: EM | Admit: 2021-07-23 | Discharge: 2021-07-23 | Disposition: A | Discharge status: Home / Self Care | Payer: Medicare Other | Attending: Emergency Medicine | Admitting: Emergency Medicine

## 2021-07-23 ENCOUNTER — Other Ambulatory Visit: Payer: Self-pay

## 2021-07-23 ENCOUNTER — Encounter (HOSPITAL_BASED_OUTPATIENT_CLINIC_OR_DEPARTMENT_OTHER): Payer: Self-pay | Admitting: Emergency Medicine

## 2021-07-23 DIAGNOSIS — Z79899 Other long term (current) drug therapy: Secondary | ICD-10-CM | POA: Diagnosis not present

## 2021-07-23 DIAGNOSIS — E119 Type 2 diabetes mellitus without complications: Secondary | ICD-10-CM | POA: Diagnosis not present

## 2021-07-23 DIAGNOSIS — I10 Essential (primary) hypertension: Secondary | ICD-10-CM | POA: Diagnosis not present

## 2021-07-23 DIAGNOSIS — M5459 Other low back pain: Secondary | ICD-10-CM | POA: Diagnosis not present

## 2021-07-23 DIAGNOSIS — E039 Hypothyroidism, unspecified: Secondary | ICD-10-CM | POA: Diagnosis not present

## 2021-07-23 DIAGNOSIS — M545 Low back pain, unspecified: Secondary | ICD-10-CM | POA: Insufficient documentation

## 2021-07-23 DIAGNOSIS — Z794 Long term (current) use of insulin: Secondary | ICD-10-CM | POA: Insufficient documentation

## 2021-07-23 MED ORDER — OXYCODONE-ACETAMINOPHEN 5-325 MG PO TABS
1.0000 | ORAL_TABLET | Freq: Once | ORAL | Status: AC
  Administered 2021-07-23: 1 via ORAL
  Filled 2021-07-23: qty 1

## 2021-07-23 MED ORDER — BACLOFEN 5 MG HALF TABLET: 5.0000 mg | ORAL_TABLET | Freq: Three times a day (TID) | ORAL | Status: DC

## 2021-07-23 MED ORDER — BACLOFEN 5 MG PO TABS: 5.0000 mg | ORAL_TABLET | Freq: Three times a day (TID) | ORAL | 0 refills | Status: DC | PRN

## 2021-07-23 NOTE — Discharge Instructions (Addendum)
It was a pleasure taking care of you today.  Your low back pain is most likely from muscle spasms or a flare of your sciatica.  I have sent a prescription for baclofen to your pharmacy for you to take for a few days while your back calms down.  Please follow-up with your primary care provider if anything worsens or you can return here for further evaluation. ?

## 2021-07-23 NOTE — ED Triage Notes (Signed)
Pt had a bone density test yesterday. States since she was laying flat on the table she has had low back pain. Pain is worse on the right side.  ?

## 2021-07-23 NOTE — ED Provider Notes (Signed)
[facility] Provider Note   CSN: [id number] Arrival date & time: 07/23/21  1005     History  Chief Complaint  Patient presents with   Back Pain    [name] [name] is a 76 y.o. female.  Patient is a 76 year old female with PMH of HLD, hepatic steatosis, fibromyalgia, IBS, depression, bipolar 1 disorder, HTN, hypothyroidism, T2DM presenting with bilateral low back pain.  Patient reports that she went for a bone density scan yesterday and had to lay flat on the table for the scan to be performed.  She reports that after the scan she has had terrible pain in her low back.  She reports history of sciatica which she takes gabapentin 600 mg 3 times daily.  Denies any fever, chills, cough, congestion, rhinorrhea, headaches, change in vision.  Reports nausea due to the pain.  Denies any actual vomiting, diarrhea, constipation.      Home Medications Prior to Admission medications   Medication Sig Start Date End Date Taking? Authorizing Provider  Baclofen 5 MG TABS Take 5 mg by mouth 3 (three) times daily as needed. 07/23/21  Yes Gifford Shave, MD  acetaminophen (TYLENOL) 325 MG tablet Take 2 tablets (650 mg total) by mouth every 6 (six) hours as needed. Patient taking differently: Take 650 mg by mouth every 6 (six) hours as needed for mild pain. 02/11/21   Jeanell Sparrow, DO  ALPRAZolam Duanne Moron) 0.5 MG tablet Take 1 tablet (0.5 mg total) by mouth 4 (four) times daily as needed for anxiety. 07/28/21   [name], PMHNP  atorvastatin (LIPITOR) 40 MG tablet Take 40 mg by mouth every evening.    [provider]  b complex vitamins capsule Take 1 capsule by mouth daily.    [provider]  BD PEN NEEDLE NANO 2ND GEN 32G X 4 MM MISC USE TO INJECT 5 TIMES DAILY AS DIRECTED 04/28/21   Elayne Snare, MD  cholecalciferol (VITAMIN D3) 25 MCG (1000 UNIT) tablet Take 1,000 Units by mouth daily. Also takes 2,000 units capsule at bedtime    [provider]  Cholecalciferol (VITAMIN D3) 50 MCG (2000 UT) TABS Take 2,000 Units by mouth at bedtime. Also takes 1,000 units capsule in the morning    [provider]  dicyclomine (BENTYL) 20 MG tablet Take 1 tablet (20 mg total) by mouth 3 (three) times daily as needed for spasms. 06/17/21   Shelly Coss, MD  divalproex (DEPAKOTE) 250 MG DR tablet Take 2 tablets (500 mg total) by mouth at bedtime. 07/03/21   [name], PMHNP  donepezil (ARICEPT) 10 MG tablet Take 1 tablet (10 mg total) by mouth at bedtime. 07/03/21   [name], PMHNP  furosemide (LASIX) 20 MG tablet Take 1 tablet (20 mg total) by mouth daily for 10 days. 07/06/21 07/16/21  Regan Lemming, MD  gabapentin (NEURONTIN) 600 MG tablet Take 600 mg by mouth 3 (three) times daily. 05/12/19   [provider]  glucose blood (CONTOUR NEXT TEST) test strip USE TO TEST BLOOD SUGAR TWICE DAILY 06/20/20   Elayne Snare, MD  insulin lispro (HUMALOG KWIKPEN) 100 UNIT/ML KwikPen ADMINISTER 10 UNITS UNDER THE SKIN BEFORE MEALS Patient taking differently: Inject 10 Units into the skin 3 (three) times daily as needed (high blood sugar). 5-10 units 3 times a day 05/30/21   Elayne Snare, MD  LANTUS SOLOSTAR 100 UNIT/ML Solostar Pen ADMINISTER 64 UNITS UNDER THE SKIN AT BEDTIME Patient taking differently: ADMINISTER 66 UNITS UNDER THE SKIN  AT BEDTIME 06/18/21   Elayne Snare, MD  levothyroxine (SYNTHROID) 75 MCG tablet TAKE 1 TABLET BY MOUTH EVERY DAY BEFORE BREAKFAST Patient taking differently: Take 75 mcg by mouth daily before breakfast. 03/27/21   Elayne Snare, MD  liothyronine (CYTOMEL) 5 MCG tablet TAKE 1 TABLET BY MOUTH DAILY WITH LEVOTHYROXINE. TAKE BEFORE BREAKFAST Patient taking differently: Take 5 mcg by mouth daily. 03/27/21   Elayne Snare, MD  lithium carbonate (ESKALITH) 450 MG CR tablet Take 1 tablet (450 mg total) by mouth at bedtime. 07/03/21   [name], PMHNP  losartan (COZAAR) 25 MG tablet Take 25 mg by mouth every  evening. 08/31/19   [provider]  memantine (NAMENDA) 10 MG tablet TAKE 1 TABLET(10 MG) BY MOUTH EVERY EVENING 07/03/21   [name], PMHNP  metoCLOPramide (REGLAN) 5 MG tablet Take 1 tablet (5 mg total) by mouth every 6 (six) hours as needed for up to 15 days for nausea or vomiting. 06/17/21 07/02/21  Shelly Coss, MD  Microlet Lancets MISC TEST DAILY AS DIRECTED 06/20/20   Elayne Snare, MD  nitroGLYCERIN (NITROSTAT) 0.4 MG SL tablet Place 1 tablet (0.4 mg total) under the tongue every 5 (five) minutes as needed for chest pain. 09/14/17   Lyda Jester M, PA-C  ondansetron (ZOFRAN) 4 MG tablet Take 1 tablet (4 mg total) by mouth every 8 (eight) hours as needed for nausea or vomiting. 06/17/21   Shelly Coss, MD  oxyCODONE (ROXICODONE) 5 MG immediate release tablet Take 1 tablet (5 mg total) by mouth every 6 (six) hours as needed for up to 10 doses for breakthrough pain. 07/04/21   Curatolo, Adam, DO  oxyCODONE-acetaminophen (PERCOCET/ROXICET) 5-325 MG tablet Take 1 tablet by mouth every 6 (six) hours as needed for severe pain. 06/17/21   Shelly Coss, MD  QUEtiapine (SEROQUEL) 200 MG tablet TAKE 1 TAB PO q 5 pm and 1 tab po QHS 07/03/21   [name], PMHNP  rOPINIRole (REQUIP) 0.5 MG tablet Takes 4 tabs at 5 pm and 3 tabs at bedtime and 1-2 tabs prn 07/03/21   [name], PMHNP  tiZANidine (ZANAFLEX) 4 MG tablet Take 1 tablet (4 mg total) by mouth every 6 (six) hours as needed for muscle spasms. 32/67/12   Delora Fuel, MD  triamcinolone (NASACORT) 55 MCG/ACT AERO nasal inhaler Place 2 sprays into the nose daily as needed (allergies).    [provider]      Allergies    Codeine, Procaine hcl, Aspirin, Augmentin [amoxicillin-pot clavulanate], Benadryl [diphenhydramine], Diflucan [fluconazole], Dilaudid [hydromorphone hcl], Hydromorphone, Morphine, Other, Prednisone, Tramadol hcl, and Sulfa antibiotics    Review of Systems   Review of Systems  Constitutional:   Negative for chills and fever.  HENT:  Negative for ear pain and sore throat.   Eyes:  Negative for pain and visual disturbance.  Respiratory:  Negative for cough and shortness of breath.   Cardiovascular:  Negative for chest pain and palpitations.  Gastrointestinal:  Positive for nausea. Negative for abdominal pain and vomiting.  Genitourinary:  Negative for dysuria and hematuria.  Musculoskeletal:  Positive for arthralgias and back pain.  Skin:  Negative for rash.  Neurological:  Negative for syncope.  All other systems reviewed and are negative.  Physical Exam Updated Vital Signs BP 129/70   Pulse 84   Temp 97.7 F (36.5 C)   Resp 18   SpO2 96%  Physical Exam Vitals reviewed.  Constitutional:      Appearance: Normal appearance.  HENT:  Right Ear: External ear normal.     Left Ear: External ear normal.     Mouth/Throat:     Mouth: Mucous membranes are moist.  Eyes:     Extraocular Movements: Extraocular movements intact.  Cardiovascular:     Rate and Rhythm: Normal rate.     Pulses: Normal pulses.  Pulmonary:     Effort: Pulmonary effort is normal.  Abdominal:     General: Abdomen is flat.  Musculoskeletal:        General: Tenderness (Patient with low back tenderness paraspinal, no spinous process or bony tenderness) present.     Cervical back: Normal range of motion and neck supple. No rigidity.  Skin:    General: Skin is warm.     Capillary Refill: Capillary refill takes less than 2 seconds.  Neurological:     General: No focal deficit present.     Mental Status: She is alert.  Psychiatric:        Mood and Affect: Mood normal.    ED Results / Procedures / Treatments   Labs (all labs ordered are listed, but only abnormal results are displayed) Labs Reviewed - No data to display  EKG None  Radiology MM 3D SCREEN BREAST BILATERAL  Result Date: 07/23/2021 CLINICAL DATA:  Screening. EXAM: DIGITAL SCREENING BILATERAL MAMMOGRAM WITH TOMOSYNTHESIS AND CAD  TECHNIQUE: Bilateral screening digital craniocaudal and mediolateral oblique mammograms were obtained. Bilateral screening digital breast tomosynthesis was performed. The images were evaluated with computer-aided detection. COMPARISON:  Previous exam(s). ACR Breast Density Category b: There are scattered areas of fibroglandular density. FINDINGS: There are no findings suspicious for malignancy. IMPRESSION: No mammographic evidence of malignancy. A result letter of this screening mammogram will be mailed directly to the patient. RECOMMENDATION: Screening mammogram in one year. (Code:SM-B-01Y) BI-RADS CATEGORY  1: Negative. Electronically Signed   By: Everlean Alstrom M.D.   On: 07/23/2021 09:44   DG MOBILE BONE DENSITY  Result Date: 07/22/2021 CLINICAL DATA:  76 year old postmenopausal Caucasian female with estrogen deficiency, currently on vitamin-D supplementation. EXAM: DUAL X-RAY ABSORPTIOMETRY (DXA) FOR BONE MINERAL DENSITY TECHNIQUE: Bone mineral density measurements are performed of the spine, hip, and forearm, as appropriate, per International Society of Clinical Densitometry recommendations. The pertinent regions of interest are reported below. Non-contributory values are not reported. Images are obtained for bone mineral density measurement and are not obtained for diagnostic purposes. FINDINGS: AP LUMBAR SPINE L3-L4 Bone Mineral Density (BMD):  1.073 g/cm2 Young Adult T-Score:  -0.3 Z-Score:  2.2 LEFT FEMUR NECK Bone Mineral Density (BMD):  0.731 g/cm2 Young Adult T-Score: -1.1 Z-Score:  0.9 LEFT FOREARM (1/3 RADIUS) Bone Mineral Density (BMD):  0.628 Young Adult T Score:  -1.1 Z Score:  1.3 Unit: This study was performed at [facility] on the [place] (S/N [phone]), software version 13.4.2. Scan quality: The scan quality is good. Exclusions: L1 and L2 ASSESSMENT: Patient's diagnostic category is LOW BONE MASS/OSTEOPENIA by [facility] Criteria. FRACTURE RISK: INCREASED FRAX: Based on the  [place] model, the 10 year probability of a major osteoporotic fracture is 9%. The 10 year probability of a hip fracture is 1.2%. COMPARISON: None. RECOMMENDATIONS 1. All patients should optimize calcium and vitamin D intake. 2. Consider FDA-approved medical therapies in postmenopausal women and men aged 61 years and older, based on the following: - A hip or vertebral (clinical or morphometric) fracture - T-score less than or equal to -2.5 and secondary causes have been excluded. - Low bone mass (T-score  between -1.0 and -2.5) and a 10-year probability of a hip fracture greater than or equal to 3% or a 10-year probability of a major osteoporosis-related fracture greater than or equal to 20% based on the [place]-adapted WHO algorithm. - Clinician judgment and/or patient preferences may indicate treatment for people with 10-year fracture probabilities above or below these levels 3. Patients with diagnosis of osteoporosis or at high risk for fracture should have regular bone mineral density tests. For patients eligible for Medicare, routine testing is allowed once every 2 years. The testing frequency can be increased to one year for patients who have rapidly progressing disease, those who are receiving or discontinuing medical therapy to restore bone mass, or have additional risk factors. Electronically Signed   By: Margarette Canada M.D.   On: 07/22/2021 16:42    Procedures Procedures    Medications Ordered in ED Medications  oxyCODONE-acetaminophen (PERCOCET/ROXICET) 5-325 MG per tablet 1 tablet (1 tablet Oral Given 07/23/21 1241)    ED Course/ Medical Decision Making/ A&P                           Medical Decision Making Patient is 76 year old with past medical history of sciatica presenting with low back pain after laying flat on DEXA scan table yesterday.  Has been seen for this on multiple occasions.  No recent injuries.  Vital signs within normal limits.  Physical exam was reassuring.   Differential includes sciatica versus lumbar muscle strain.  Patient given 1 dose of oxycodone for pain with improvement.  Sent home with short prescription of baclofen.  Return precautions given.  Patient discharged home to the care of her husband.   Final Clinical Impression(s) / ED Diagnoses Final diagnoses:  Acute bilateral low back pain, unspecified whether sciatica present    Rx / DC Orders ED Discharge Orders          Ordered    Baclofen 5 MG TABS  3 times daily PRN        07/23/21 1250              Gifford Shave, MD 07/23/21 1251    Elnora Morrison, MD 07/26/21 [phone]

## 2021-07-26 ENCOUNTER — Other Ambulatory Visit: Payer: Self-pay | Admitting: Family Medicine

## 2021-07-28 DIAGNOSIS — M545 Low back pain, unspecified: Secondary | ICD-10-CM | POA: Diagnosis not present

## 2021-07-29 DIAGNOSIS — M545 Low back pain, unspecified: Secondary | ICD-10-CM | POA: Diagnosis not present

## 2021-07-31 ENCOUNTER — Other Ambulatory Visit: Payer: Self-pay | Admitting: Endocrinology

## 2021-07-31 DIAGNOSIS — E1165 Type 2 diabetes mellitus with hyperglycemia: Secondary | ICD-10-CM

## 2021-07-31 DIAGNOSIS — E1169 Type 2 diabetes mellitus with other specified complication: Secondary | ICD-10-CM

## 2021-08-01 DIAGNOSIS — A0472 Enterocolitis due to Clostridium difficile, not specified as recurrent: Secondary | ICD-10-CM | POA: Diagnosis not present

## 2021-08-01 DIAGNOSIS — Z6831 Body mass index (BMI) 31.0-31.9, adult: Secondary | ICD-10-CM | POA: Diagnosis not present

## 2021-08-01 DIAGNOSIS — A0471 Enterocolitis due to Clostridium difficile, recurrent: Secondary | ICD-10-CM | POA: Diagnosis not present

## 2021-08-05 DIAGNOSIS — M4726 Other spondylosis with radiculopathy, lumbar region: Secondary | ICD-10-CM | POA: Diagnosis not present

## 2021-08-07 ENCOUNTER — Ambulatory Visit (INDEPENDENT_AMBULATORY_CARE_PROVIDER_SITE_OTHER): Payer: Medicare Other | Admitting: Adult Health

## 2021-08-07 ENCOUNTER — Encounter: Payer: Self-pay | Admitting: Adult Health

## 2021-08-07 VITALS — BP 155/85 | HR 87 | Ht 61.0 in | Wt 179.6 lb

## 2021-08-07 DIAGNOSIS — M5451 Vertebrogenic low back pain: Secondary | ICD-10-CM | POA: Diagnosis not present

## 2021-08-07 DIAGNOSIS — R413 Other amnesia: Secondary | ICD-10-CM | POA: Diagnosis not present

## 2021-08-07 NOTE — Patient Instructions (Signed)
Please contact [facility] to schedule an MRI of your brain - this will help determine possible causes of your memory loss  [phone]  Your testing looks stable today compared to what it was 6 months ago - doing memory exercises such crossword puzzles, word search, soduku, etc can be helpful for your memory   We will determine if follow up visit is needed after completion of your MRI
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
## 2021-08-08 ENCOUNTER — Ambulatory Visit: Payer: Medicare Other | Admitting: Adult Health

## 2021-08-11 ENCOUNTER — Ambulatory Visit (INDEPENDENT_AMBULATORY_CARE_PROVIDER_SITE_OTHER): Payer: Medicare Other | Admitting: Psychiatry

## 2021-08-11 ENCOUNTER — Encounter: Payer: Self-pay | Admitting: Psychiatry

## 2021-08-11 DIAGNOSIS — F319 Bipolar disorder, unspecified: Secondary | ICD-10-CM

## 2021-08-11 DIAGNOSIS — F411 Generalized anxiety disorder: Secondary | ICD-10-CM | POA: Diagnosis not present

## 2021-08-11 DIAGNOSIS — G2581 Restless legs syndrome: Secondary | ICD-10-CM

## 2021-08-11 DIAGNOSIS — F5101 Primary insomnia: Secondary | ICD-10-CM | POA: Diagnosis not present
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
## 2021-08-13 DIAGNOSIS — Z6833 Body mass index (BMI) 33.0-33.9, adult: Secondary | ICD-10-CM | POA: Diagnosis not present

## 2021-08-13 DIAGNOSIS — E785 Hyperlipidemia, unspecified: Secondary | ICD-10-CM | POA: Diagnosis not present

## 2021-08-13 DIAGNOSIS — E1165 Type 2 diabetes mellitus with hyperglycemia: Secondary | ICD-10-CM | POA: Diagnosis not present

## 2021-08-13 DIAGNOSIS — K589 Irritable bowel syndrome without diarrhea: Secondary | ICD-10-CM | POA: Diagnosis not present

## 2021-08-14 NOTE — Therapy (Signed)
OUTPATIENT PHYSICAL THERAPY THORACOLUMBAR EVALUATION   Patient Name: [name] [name] MRN: [id number] DOB:1945/06/18, 76 y.o., female Today's Date: 08/15/2021   PT End of Session - 08/15/21 1108     Visit Number 1    Number of Visits 12    Date for PT Re-Evaluation 09/26/21    Authorization Type Medicare A and B    PT Start Time 1020    PT Stop Time 1107    PT Time Calculation (min) 47 min    Activity Tolerance Patient tolerated treatment well    Behavior During Therapy WFL for tasks assessed/performed             Past Medical History:  Diagnosis Date   Alcohol abuse    Anxiety    takes Valium daily as needed and Ativan daily   Bilateral hearing loss    Bipolar 1 disorder ([place])    takes Lithium nightly and Synthroid daily   Chronic back pain    DDD; "all over" (09/14/2017)   Colitis, ischemic ([place]) 2012   Confusion    r/t meds   Depression    takes Prozac daily and Bupspirone    Diverticulosis    Dyslipidemia    takes Crestor daily   Fibromyalgia    Gastroparesis    Headache    "weekly" (09/14/2017)   Hepatic steatosis 06/18/12   severe   Hyperlipidemia    Hypertension    Hypothyroidism    IBS (irritable bowel syndrome)    Ischemic colitis ([place])    Joint pain    Joint swelling    Lupus erythematosus tumidus    tumid-skin   Migraine    "1-2/yr; maybe" (09/14/2017)   Numbness    in right foot   Osteoarthritis    "all over" (09/14/2017)   Osteoarthritis cervical spine    Osteoarthritis of hand    bilateral   Pneumonia    "walking pneumonia several times; long time since the last time" (09/14/2017)   Restless leg syndrome    takes Requip nightly   Sciatica    Type II diabetes mellitus ([place])    Urinary frequency    Urinary leakage    Urinary urgency    Urinary, incontinence, stress female    Walking pneumonia    last time more than 71yr ago   Past Surgical History:  Procedure Laterality Date   ABDOMINAL HYSTERECTOMY     "they left my ovaries"    APPENDECTOMY     BALLOON DILATION N/A 06/14/2020   Procedure: BALLOON DILATION;  Surgeon: KRonnette Juniper MD;  Location: [facility];  Service: Gastroenterology;  Laterality: N/A;   BIOPSY  06/14/2020   Procedure: BIOPSY;  Surgeon: KRonnette Juniper MD;  Location: [facility];  Service: Gastroenterology;;   CARDIAC CATHETERIZATION  09/14/2017   COLON RESECTION  05/2021   COLONOSCOPY     DENTAL SURGERY Left 10/2016   dental implant   DILATION AND CURETTAGE OF UTERUS  X 4   ESOPHAGOGASTRODUODENOSCOPY     ESOPHAGOGASTRODUODENOSCOPY (EGD) WITH PROPOFOL N/A 06/14/2020   Procedure: ESOPHAGOGASTRODUODENOSCOPY (EGD) WITH PROPOFOL;  Surgeon: KRonnette Juniper MD;  Location: [facility];  Service: Gastroenterology;  Laterality: N/A;   FLEXIBLE SIGMOIDOSCOPY N/A 06/21/2012   Procedure: FLEXIBLE SIGMOIDOSCOPY;  Surgeon: JJerene Bears MD;  Location: [facility];  Service: Gastroenterology;  Laterality: N/A;   JOINT REPLACEMENT     LEFT HEART CATH AND CORONARY ANGIOGRAPHY N/A 09/14/2017   Procedure: LEFT HEART CATH AND CORONARY ANGIOGRAPHY;  Surgeon: SBelva Crome  MD;  Location: [facility];  Service: Cardiovascular;  Laterality: N/A;   SHOULDER ARTHROSCOPY Right    "shaved spurs off rotator cuff"   TONSILLECTOMY     TOTAL HIP ARTHROPLASTY Right 06/09/2013   Procedure: TOTAL HIP ARTHROPLASTY;  Surgeon: Kerin Salen, MD;  Location: [facility];  Service: Orthopedics;  Laterality: Right;   TOTAL KNEE ARTHROPLASTY Left 02/07/2019   Procedure: TOTAL KNEE ARTHROPLASTY;  Surgeon: Paralee Cancel, MD;  Location: [facility];  Service: Orthopedics;  Laterality: Left;  70 mins   TUBAL LIGATION     TUMOR EXCISION Right 1968   angle of jaw; benign   Patient Active Problem List   Diagnosis Date Noted   C. difficile colitis 06/13/2021   Type 2 diabetes mellitus with diabetic polyneuropathy, with long-term current use of insulin ([place]) 01/27/2021   Memory disorder 01/27/2021   Bipolar 1 disorder, depressed ([place])  01/27/2021   PTSD (post-traumatic stress disorder) 01/27/2021   Intractable abdominal pain 07/10/2020   Pancreatic abnormality 07/10/2020   Diabetes mellitus type 2 in obese ([place]) 07/10/2020   Essential hypertension 07/10/2020   Bipolar 1 disorder, mixed, moderate ([place])    Suicidal ideations    Overweight (BMI 25.0-29.9) 02/08/2019   S/P left TKA 02/07/2019   Chest pain    Chest pain with normal coronary angiography    Diverticulitis 11/19/2016   Hypothyroidism 07/14/2016   Diarrhea    Generalized abdominal pain    Major depressive disorder, recurrent severe without psychotic features ([place]) 12/31/2015   Nausea without vomiting    Colitis 10/28/2015   Unintentional poisoning by psychotropic drug 07/05/2015   Restless leg syndrome 07/05/2015   Peripheral sensory neuropathy due to type 2 diabetes mellitus ([place]) 09/21/2014   Arthritis of left hip 06/09/2013   Arthritis of right hip 06/08/2013   Hip pain, right 05/02/2013   IBS (irritable bowel syndrome) 01/02/2013   Unspecified vitamin D deficiency 07/05/2012   Pure hyperglyceridemia 07/04/2012   Diabetes mellitus with diabetic neuropathy, without long-term current use of insulin ([place]) 06/18/2012   Hypertension 06/18/2012   Bipolar 1 disorder ([place]) 06/18/2012   Low back pain 02/25/2011   Acute ischemic colitis ([place]) 01/06/2011   Sciatica 12/19/2010   Fibromyalgia 10/28/2010   Primary osteoarthritis of right hip 10/24/2010   Hyperlipidemia with target low density lipoprotein (LDL) cholesterol less than 100 mg/dL 05/05/2010   Anxiety and depression 05/05/2010   ABUSE, ALCOHOL, IN REMISSION 05/05/2010   OTITIS MEDIA, CHRONIC 05/05/2010   Hearing loss 05/05/2010   ALLERGIC RHINITIS, SEASONAL, MILD 05/05/2010   URINARY INCONTINENCE, STRESS, FEMALE 05/05/2010   OSTEOARTHRITIS, HANDS, BILATERAL 05/05/2010   OSTEOARTHRITIS, CERVICAL SPINE 05/05/2010      REFERRING PROVIDER: Frederik Pear, MD   REFERRING DIAG: Low Back Pain with  Radiculopathy  Rationale for Evaluation and Treatment Rehabilitation  THERAPY DIAG:  Other low back pain  Pain in left leg  Muscle weakness (generalized)  Abnormal posture  ONSET DATE: Chronic pain / 07/22/2021 (Exacerbation)  SUBJECTIVE:  SUBJECTIVE STATEMENT: Pt reports having a long hx of back pain.  Pt states she has a lot of arthritis and scoliosis.  Pt has received injections in the past which provides only short term relief.  Pt has had some PT for her back in the past and didn't see much improvement.  Pt reports approx 3 weeks ago she had an exacerbation of pain when having to lay flat on a table to get a bone density scan.  Pt went to ED for back pain the following day and received meds.  Pt went to see ortho PA on 08/05/2021 and received a steroid and a toradol injection and order for PT.  Pt reports improved sx's with injections.  Pt states she has exercises from MD that she is doing.  Pt states she is here for aquatic therapy.   "It hurts most of the time.  I keep ice and heat on it most of the time."  Pt states her back bothers her with about everything.  Pt has to be careful with her movements including bending.  Pt is limited with household chores and has a Secretary/administrator.  Pt is limited with ambulation and standing duration due to pain.  Pt is fearful of doing much because of back pain.   Pt states she can walk without her cane but primarily uses a cane for balance.  Pt states her L knee still bothers her including having stiffness and difficulty with stairs.   PERTINENT HISTORY:  -Chronic back pain, grade 1 spondylolisthesis of L4 on L5, memory loss, Fibromyalgia, OA, depression and  anxiety, osteopenia, bipolar disorder, DM type 2       -PSHx:  R THR with posterolateral approach in 2015, L TKR in  2020, R shoulder surgery 2018  PAIN:  Are you having pain? Yes:  NPRS:  4/10 current and best, 10/10 worst Location:  L sided lumbar pain down L LE to her knee Type: Constant, dull, aching, sharp stabbing   PRECAUTIONS: Other: Memory loss, R THR, grade 1 spondylolisthesis of L4 on L5, fibromyalgia, L TKR  WEIGHT BEARING RESTRICTIONS No  FALLS:  Has patient fallen in last 6 months? No  LIVING ENVIRONMENT: Lives with: lives with their spouse Lives in: 2 stairs Stairs: yes Has following equipment at home: Single point cane and Walker - 2 wheeled  OCCUPATION: Pt is retired  PLOF: Independent except has a housekeeper to help with household chores especially heavier chores; Pt ambulates with a cane.  Pt had improved mobility and was able to perform daily act's with much less pain compared to onset.    PATIENT GOALS improve pain, improve mobility, to be able to do more things   OBJECTIVE:   DIAGNOSTIC FINDINGS:  X ray in 2016 indicates:   Slight degenerative grade 1 spondylolisthesis of L4 on L5 approximating 7 mm, unchanged since the MRI. Lumbar scoliosis convex right. No fractures. Mild disc space narrowing at L3-4, unchanged since prior MRI. Remaining disc spaces well preserved. No pars defects. Mild to moderate facet degenerative changes at L3-4, L4-5 and L5-S1. Visualized sacroiliac joints intact.   IMPRESSION: 1. No acute osseous abnormality. 2. Stable lumbar scoliosis convex right, mild degenerative disc disease at L3-4, and mild to moderate facet degenerative changes at L3-4, L4-5 and L5-S1.  PATIENT SURVEYS:  FOTO pt filled out, but score not available.  May have to repeat next visit.   SCREENING FOR RED FLAGS: Bowel or bladder incontinence: Pt has had issues with urinary incontinence though  has control over it now.  Spinal tumors: No Cauda equina syndrome: No Compression fracture: No Abdominal aneurysm: No  COGNITION:  Overall cognitive status: Appears WFL.   Pt has memory loss per MD notes.    SENSATION: 2+ sensation to bilat LE's.   POSTURE:  significant FHP, rounded shoulders.  R convex curve at lumbar.    LUMBAR ROM:   Active  AROM  eval  Flexion WFL  Extension   Right lateral flexion 75% with lumbar and R LE pain  Left lateral flexion 75% with lumbar  Right rotation WFL with pain  Left rotation 75% with pain   (Blank rows = not tested)  LOWER EXTREMITY ROM:     Active  Right eval Left eval  Hip flexion    Hip extension    Hip abduction    Hip adduction    Hip internal rotation    Hip external rotation Limited worse on R limited  Knee flexion    Knee extension    Ankle dorsiflexion    Ankle plantarflexion    Ankle inversion    Ankle eversion     (Blank rows = not tested)  LOWER EXTREMITY MMT:    MMT Right eval Left eval  Hip flexion 5/5 5/5  Hip extension    Hip abduction    Hip adduction    Hip internal rotation    Hip external rotation Unable to tolerate resistance 3+/5  Knee flexion 4/5 5/5  Knee extension 5/5 4-/5  Ankle dorsiflexion 5/5 5/5  Ankle plantarflexion WFL, tested in sitting WFL, tested in sitting  Ankle inversion    Ankle eversion     (Blank rows = not tested)    GAIT: Comments: Pt ambulates with SPC on R with forward flexed posture and increased forward head.  Slow gait speed    TODAY'S TREATMENT  See below for pt education   PATIENT EDUCATION:  Education details: Educated pt in aquatic rehab process.  Educated pt in aquatic therapy benefits, purpose, and properties.  Dx, objective findings, POC, relevant anatomy, and rationale of exercises.   Person educated: Patient Education method: Explanation Education comprehension: verbalized understanding and needs further education   HOME EXERCISE PROGRAM: Will give at a later date  ASSESSMENT:  CLINICAL IMPRESSION: Patient is a 76 y.o. female with a dx of LBP with radiculopathy presenting to the clinic with L sided lumbar  and L LE pain, muscle weakness in bilat LE's, limited lumbar ROM, and abnormal posture.  Pt reports improved pain and sx's since receiving steroid and toradol injections, but continues to have constant pain and states she has back  pain with "about everything".  Pt has to be careful with her movements and is fearful of activity.  Pt is limited with household chores and has a Secretary/administrator.  Pt is limited with ambulation and standing duration due to pain.  Pt has multiple co-morbidities and states her L knee bothers her also.  She ambulates with a cane but states she is able to ambulate without a cane.  Pt states she is referred for aquatic therapy and is excited about aquatic therapy.  Pt may benefit from skilled PT services to address impairments and to improve overall function.    OBJECTIVE IMPAIRMENTS Abnormal gait, decreased activity tolerance, decreased endurance, decreased mobility, difficulty walking, decreased ROM, decreased strength, hypomobility, postural dysfunction, and pain.   ACTIVITY LIMITATIONS carrying, bending, standing, squatting, stairs, and locomotion level  PARTICIPATION LIMITATIONS: cleaning, shopping, and community activity  PERSONAL  FACTORS 3+ comorbidities: memory loss, Fibromyalgia, OA, depression and  anxiety, bipolar disorder, DM type 2, R THR, and L TKA       and chronic lumbar pain  are also affecting patient's functional outcome.   REHAB POTENTIAL: Fair multiple co-morbidities, chronic pain, and response to prior PT  CLINICAL DECISION MAKING: Evolving/moderate complexity  EVALUATION COMPLEXITY: Moderate   GOALS:  SHORT TERM GOALS: Target date: 09/05/2021  Pt will tolerate aquatic therapy without adverse effects for improved pain, strength, and tolerance to activity.  Baseline: Goal status: INITIAL  2.  Pt will report at least a 25% improvement in pain with ambulation and standing activities.  Baseline:  Goal status: INITIAL  3.  Pt will demo good  understanding of land based HEP for improved core and postural strength, pain, and function. Baseline:  Goal status: INITIAL Target date: 09/13/2021   4.  Pt will report her worst pain to be no > 8/10 for improved mobility. Baseline:  Goal status: INITIAL Target date: 09/13/2021  LONG TERM GOALS: Target date: 09/26/2021  Pt will demo improved core strength as evidenced by progressing with aquatic exercises without adverse effects for improved tolerance with and performance of functional mobility.  Baseline:  Goal status: INITIAL  2.  Pt will report at least a 70% improvement in pain and sx's overall. Baseline:  Goal status: INITIAL  3.  Pt will report she is able to perform her normal household chores without significant pain. Baseline:  Goal status: INITIAL  4.  Pt will be able to ambulate her normal community distance without significant pain.  Baseline:  Goal status: INITIAL    PLAN: PT FREQUENCY: 2x/week  PT DURATION: 6 weeks  PLANNED INTERVENTIONS: Therapeutic exercises, Therapeutic activity, Neuromuscular re-education, Balance training, Gait training, Patient/Family education, Stair training, Aquatic Therapy, Electrical stimulation, Cryotherapy, Moist heat, Taping, Manual therapy, and Re-evaluation.  PLAN FOR NEXT SESSION: Aquatic therapy to follow.  Establish HEP when on land including core and postural strengthening.    Selinda Michaels III PT, DPT 08/16/21 12:04 AM

## 2021-08-15 ENCOUNTER — Ambulatory Visit (HOSPITAL_BASED_OUTPATIENT_CLINIC_OR_DEPARTMENT_OTHER): Payer: Medicare Other | Attending: Orthopedic Surgery | Admitting: Physical Therapy

## 2021-08-15 ENCOUNTER — Encounter (HOSPITAL_BASED_OUTPATIENT_CLINIC_OR_DEPARTMENT_OTHER): Payer: Self-pay | Admitting: Physical Therapy

## 2021-08-15 DIAGNOSIS — M79605 Pain in left leg: Secondary | ICD-10-CM | POA: Diagnosis not present

## 2021-08-15 DIAGNOSIS — R293 Abnormal posture: Secondary | ICD-10-CM

## 2021-08-15 DIAGNOSIS — M5459 Other low back pain: Secondary | ICD-10-CM | POA: Diagnosis not present

## 2021-08-15 DIAGNOSIS — M6281 Muscle weakness (generalized): Secondary | ICD-10-CM | POA: Diagnosis not present

## 2021-08-15 NOTE — Therapy (Incomplete)
OUTPATIENT PHYSICAL THERAPY THORACOLUMBAR EVALUATION   Patient Name: [name] [name] MRN: [id number] DOB:[birth year], 76 y.o., female Today's Date: 08/15/2021   PT End of Session - 08/15/21 1108     Visit Number 1    Number of Visits 12    Date for PT Re-Evaluation 09/26/21    Authorization Type Medicare A and B    PT Start Time 1020    PT Stop Time 1107    PT Time Calculation (min) 47 min    Activity Tolerance Patient tolerated treatment well    Behavior During Therapy WFL for tasks assessed/performed             Past Medical History:  Diagnosis Date  . Alcohol abuse   . Anxiety    takes Valium daily as needed and Ativan daily  . Bilateral hearing loss   . Bipolar 1 disorder ([place])    takes Lithium nightly and Synthroid daily  . Chronic back pain    DDD; "all over" (09/14/2017)  . Colitis, ischemic ([place]) 2012  . Confusion    r/t meds  . Depression    takes Prozac daily and Bupspirone   . Diverticulosis   . Dyslipidemia    takes Crestor daily  . Fibromyalgia   . Gastroparesis   . Headache    "weekly" (09/14/2017)  . Hepatic steatosis 06/18/12   severe  . Hyperlipidemia   . Hypertension   . Hypothyroidism   . IBS (irritable bowel syndrome)   . Ischemic colitis ([place])   . Joint pain   . Joint swelling   . Lupus erythematosus tumidus    tumid-skin  . Migraine    "1-2/yr; maybe" (09/14/2017)  . Numbness    in right foot  . Osteoarthritis    "all over" (09/14/2017)  . Osteoarthritis cervical spine   . Osteoarthritis of hand    bilateral  . Pneumonia    "walking pneumonia several times; long time since the last time" (09/14/2017)  . Restless leg syndrome    takes Requip nightly  . Sciatica   . Type II diabetes mellitus ([place])   . Urinary frequency   . Urinary leakage   . Urinary urgency   . Urinary, incontinence, stress female   . Walking pneumonia    last time more than 78yr ago   Past Surgical History:  Procedure Laterality Date  . ABDOMINAL  HYSTERECTOMY     "they left my ovaries"  . APPENDECTOMY    . BALLOON DILATION N/A 06/14/2020   Procedure: BALLOON DILATION;  Surgeon: KRonnette Juniper MD;  [facility];  Service: Gastroenterology;  Laterality: N/A;  . BIOPSY  06/14/2020   Procedure: BIOPSY;  Surgeon: KRonnette Juniper MD;  Location: [facility];  Service: Gastroenterology;;  . CARDIAC CATHETERIZATION  09/14/2017  . COLON RESECTION  05/2021  . COLONOSCOPY    . DENTAL SURGERY Left 10/2016   dental implant  . DILATION AND CURETTAGE OF UTERUS  X 4  . ESOPHAGOGASTRODUODENOSCOPY    . ESOPHAGOGASTRODUODENOSCOPY (EGD) WITH PROPOFOL N/A 06/14/2020   Procedure: ESOPHAGOGASTRODUODENOSCOPY (EGD) WITH PROPOFOL;  Surgeon: KRonnette Juniper MD;  Location: [facility];  Service: Gastroenterology;  Laterality: N/A;  . FLEXIBLE SIGMOIDOSCOPY N/A 06/21/2012   Procedure: FLEXIBLE SIGMOIDOSCOPY;  Surgeon: JJerene Bears MD;  Location: [facility];  Service: Gastroenterology;  Laterality: N/A;  . JOINT REPLACEMENT    . LEFT HEART CATH AND CORONARY ANGIOGRAPHY N/A 09/14/2017   Procedure: LEFT HEART CATH AND CORONARY ANGIOGRAPHY;  Surgeon: SBelva Crome  MD;  Location: [facility];  Service: Cardiovascular;  Laterality: N/A;  . SHOULDER ARTHROSCOPY Right    "shaved spurs off rotator cuff"  . TONSILLECTOMY    . TOTAL HIP ARTHROPLASTY Right 06/09/2013   Procedure: TOTAL HIP ARTHROPLASTY;  Surgeon: Kerin Salen, MD;  Location: [facility];  Service: Orthopedics;  Laterality: Right;  . TOTAL KNEE ARTHROPLASTY Left 02/07/2019   Procedure: TOTAL KNEE ARTHROPLASTY;  Surgeon: Paralee Cancel, MD;  Location: [facility];  Service: Orthopedics;  Laterality: Left;  70 mins  . TUBAL LIGATION    . TUMOR EXCISION Right 1968   angle of jaw; benign   Patient Active Problem List   Diagnosis Date Noted  . C. difficile colitis 06/13/2021  . Type 2 diabetes mellitus with diabetic polyneuropathy, with long-term current use of insulin ([place]) 01/27/2021  . Memory  disorder 01/27/2021  . Bipolar 1 disorder, depressed ([place]) 01/27/2021  . PTSD (post-traumatic stress disorder) 01/27/2021  . Intractable abdominal pain 07/10/2020  . Pancreatic abnormality 07/10/2020  . Diabetes mellitus type 2 in obese ([place]) 07/10/2020  . Essential hypertension 07/10/2020  . Bipolar 1 disorder, mixed, moderate ([place])   . Suicidal ideations   . Overweight (BMI 25.0-29.9) 02/08/2019  . S/P left TKA 02/07/2019  . Chest pain   . Chest pain with normal coronary angiography   . Diverticulitis 11/19/2016  . Hypothyroidism 07/14/2016  . Diarrhea   . Generalized abdominal pain   . Major depressive disorder, recurrent severe without psychotic features ([place]) 12/31/2015  . Nausea without vomiting   . Colitis 10/28/2015  . Unintentional poisoning by psychotropic drug 07/05/2015  . Restless leg syndrome 07/05/2015  . Peripheral sensory neuropathy due to type 2 diabetes mellitus ([place]) 09/21/2014  . Arthritis of left hip 06/09/2013  . Arthritis of right hip 06/08/2013  . Hip pain, right 05/02/2013  . IBS (irritable bowel syndrome) 01/02/2013  . Unspecified vitamin D deficiency 07/05/2012  . Pure hyperglyceridemia 07/04/2012  . Diabetes mellitus with diabetic neuropathy, without long-term current use of insulin ([place]) 06/18/2012  . Hypertension 06/18/2012  . Bipolar 1 disorder ([place]) 06/18/2012  . Low back pain 02/25/2011  . Acute ischemic colitis ([place]) 01/06/2011  . Sciatica 12/19/2010  . Fibromyalgia 10/28/2010  . Primary osteoarthritis of right hip 10/24/2010  . Hyperlipidemia with target low density lipoprotein (LDL) cholesterol less than 100 mg/dL 05/05/2010  . Anxiety and depression 05/05/2010  . ABUSE, ALCOHOL, IN REMISSION 05/05/2010  . OTITIS MEDIA, CHRONIC 05/05/2010  . Hearing loss 05/05/2010  . ALLERGIC RHINITIS, SEASONAL, MILD 05/05/2010  . URINARY INCONTINENCE, STRESS, FEMALE 05/05/2010  . OSTEOARTHRITIS, HANDS, BILATERAL 05/05/2010  . OSTEOARTHRITIS, CERVICAL  SPINE 05/05/2010      REFERRING PROVIDER: Frederik Pear, MD   REFERRING DIAG: Low Back Pain with Radiculopathy  Rationale for Evaluation and Treatment Rehabilitation  THERAPY DIAG:  Other low back pain  Pain in left leg  Muscle weakness (generalized)  Abnormal posture  ONSET DATE: Chronic pain / 07/22/2021 (Exacerbation)  SUBJECTIVE:  SUBJECTIVE STATEMENT: Pt reports having a long hx of back pain.  Pt states she has a lot of arthritis and scoliosis.  Pt has received injections in the past which provides only short term relief.  Pt has had some PT for her back in the past and didn't see much improvement.  Pt reports approx 3 weeks ago she had an exacerbation of pain when having to lay flat on a table to get a bone density scan.  Pt went to ED for back pain the following day and received meds.  Pt went to see ortho PA on 08/05/2021 and received a steroid and a toradol injection and order for PT.  Pt reports improved sx's with injections.  Pt states she has exercises from MD that she is doing.  Pt states she is here for aquatic therapy.   "It hurts most of the time.  I keep ice and heat on it most of the time."  Pt states her back bothers her with about everything.  Pt has to be careful with her movements including bending.  Pt is limited with household chores and has a Secretary/administrator.  Pt is limited with ambulation and standing duration due to pain.  Pt is fearful of doing much because of back pain.   Pt states she can walk without her cane but primarily uses a cane for balance.  Pt states her L knee still bothers her including having stiffness and difficulty with stairs.   PERTINENT HISTORY:  -Chronic back pain, grade 1 spondylolisthesis of L4 on L5, memory loss, Fibromyalgia, OA, depression and  anxiety,  osteopenia, bipolar disorder, DM type 2       -PSHx:  R THR with posterolateral approach in 2015, L TKR in 2020, R shoulder surgery 2018  PAIN:  Are you having pain? Yes:  NPRS:  4/10 current and best, 10/10 worst Location:  L sided lumbar pain down L LE to her knee Type: Constant, dull, aching, sharp stabbing   PRECAUTIONS: Other: Memory loss, R THR, grade 1 spondylolisthesis of L4 on L5, fibromyalgia, L TKR  WEIGHT BEARING RESTRICTIONS No  FALLS:  Has patient fallen in last 6 months? No  LIVING ENVIRONMENT: Lives with: lives with their spouse Lives in: 2 stairs Stairs: yes Has following equipment at home: Single point cane and Walker - 2 wheeled  OCCUPATION: Pt is retired  PLOF: Independent except has a housekeeper to help with household chores especially heavier chores; Pt ambulates with a cane.  Pt had improved mobility and was able to perform daily act's with much less pain compared to onset.    PATIENT GOALS improve pain, improve mobility, to be able to do more things   OBJECTIVE:   DIAGNOSTIC FINDINGS:  X ray in 2016 indicates:   Slight degenerative grade 1 spondylolisthesis of L4 on L5 approximating 7 mm, unchanged since the MRI. Lumbar scoliosis convex right. No fractures. Mild disc space narrowing at L3-4, unchanged since prior MRI. Remaining disc spaces well preserved. No pars defects. Mild to moderate facet degenerative changes at L3-4, L4-5 and L5-S1. Visualized sacroiliac joints intact.   IMPRESSION: 1. No acute osseous abnormality. 2. Stable lumbar scoliosis convex right, mild degenerative disc disease at L3-4, and mild to moderate facet degenerative changes at L3-4, L4-5 and L5-S1.  PATIENT SURVEYS:  FOTO pt filled out, but score not available.  May have to repeat next visit.   SCREENING FOR RED FLAGS: Bowel or bladder incontinence: Pt has had issues with urinary incontinence though  has control over it now.  Spinal tumors: No Cauda equina syndrome:  No Compression fracture: No Abdominal aneurysm: No  COGNITION:  Overall cognitive status: Appears WFL.  Pt has memory loss per MD notes.    SENSATION: 2+ sensation to bilat LE's.   POSTURE:  significant FHP, rounded shoulders.  R convex curve at lumbar.    LUMBAR ROM:   Active  AROM  eval  Flexion WFL  Extension   Right lateral flexion 75% with lumbar and R LE pain  Left lateral flexion 75% with lumbar  Right rotation WFL with pain  Left rotation 75% with pain   (Blank rows = not tested)  LOWER EXTREMITY ROM:     Active  Right eval Left eval  Hip flexion    Hip extension    Hip abduction    Hip adduction    Hip internal rotation    Hip external rotation Limited worse on R limited  Knee flexion    Knee extension    Ankle dorsiflexion    Ankle plantarflexion    Ankle inversion    Ankle eversion     (Blank rows = not tested)  LOWER EXTREMITY MMT:    MMT Right eval Left eval  Hip flexion 5/5 5/5  Hip extension    Hip abduction    Hip adduction    Hip internal rotation    Hip external rotation Unable to tolerate resistance 3+/5  Knee flexion 4/5 5/5  Knee extension 5/5 4-/5  Ankle dorsiflexion 5/5 5/5  Ankle plantarflexion WFL, tested in sitting WFL, tested in sitting  Ankle inversion    Ankle eversion     (Blank rows = not tested)    GAIT: Comments: Pt ambulates with SPC on R with forward flexed posture and increased forward head.  Slow gait speed    TODAY'S TREATMENT  See below for pt education   PATIENT EDUCATION:  Education details: Educated pt in aquatic rehab process.  Educated pt in aquatic therapy benefits, purpose, and properties.  Dx, objective findings, POC, relevant anatomy, and rationale of exercises.   Person educated: Patient Education method: Explanation Education comprehension: verbalized understanding and needs further education   HOME EXERCISE PROGRAM: Will give at a later date  ASSESSMENT:  CLINICAL  IMPRESSION: Patient is a 76 y.o. female with a dx of LBP with radiculopathy presenting to the clinic with L sided lumbar and L LE pain, muscle weakness in bilat LE's, limited lumbar ROM, and abnormal posture.  Pt reports improved pain and sx's since receiving steroid and toradol injections, but continues to have constant pain and states she has back  pain with "about everything".  Pt has to be careful with her movements and is fearful of activity.  Pt is limited with household chores and has a Secretary/administrator.  Pt is limited with ambulation and standing duration due to pain.  Pt has multiple co-morbidities and states her L knee bothers her also.  She ambulates with a cane but states she is able to ambulate without a cane.  Pt states she is referred for aquatic therapy and is excited about aquatic therapy.  Pt may benefit from skilled PT services to address impairments and to improve overall function.    OBJECTIVE IMPAIRMENTS Abnormal gait, decreased activity tolerance, decreased endurance, decreased mobility, difficulty walking, decreased ROM, decreased strength, hypomobility, postural dysfunction, and pain.   ACTIVITY LIMITATIONS carrying, bending, standing, squatting, stairs, and locomotion level  PARTICIPATION LIMITATIONS: cleaning, shopping, and community activity  PERSONAL  FACTORS 3+ comorbidities: memory loss, Fibromyalgia, OA, depression and  anxiety, bipolar disorder, DM type 2, R THR, and L TKA       and chronic lumbar pain  are also affecting patient's functional outcome.   REHAB POTENTIAL: Fair multiple co-morbidities, chronic pain, and response to prior PT  CLINICAL DECISION MAKING: Evolving/moderate complexity  EVALUATION COMPLEXITY: Moderate   GOALS:  SHORT TERM GOALS: Target date: 09/05/2021  Pt will tolerate aquatic therapy without adverse effects for improved pain, strength, and tolerance to activity.  Baseline: Goal status: INITIAL  2.  Pt will report at least a 25% improvement  in pain with ambulation and standing activities.  Baseline:  Goal status: INITIAL  3.  Pt will demo good understanding of land based HEP for improved core and postural strength, pain, and function. Baseline:  Goal status: INITIAL Target date: 09/13/2021   4.  Pt will report her worst pain to be no > 8/10 Baseline:  Goal status: {GOALSTATUS:25110}  5.  *** Baseline:  Goal status: {GOALSTATUS:25110}  6.  *** Baseline:  Goal status: {GOALSTATUS:25110}  LONG TERM GOALS: Target date: 09/26/2021  Pt will demo improved core strength as evidenced by progressing with aquatic exercises without adverse effects for improved tolerance with and performance of functional mobility.  Baseline:  Goal status: INITIAL  2.  Pt will report at least a 70% improvement in pain and sx's overall. Baseline:  Goal status: INITIAL  3.  Pt will report she is able to perform her normal household chores without significant pain. Baseline:  Goal status: INITIAL  4.  Pt will be able to ambulate her normal community distance without significant pain.  Baseline:  Goal status: INITIAL    PLAN: PT FREQUENCY: 2x/week  PT DURATION: 6 weeks  PLANNED INTERVENTIONS: Therapeutic exercises, Therapeutic activity, Neuromuscular re-education, Balance training, Gait training, Patient/Family education, Stair training, Aquatic Therapy, Electrical stimulation, Cryotherapy, Moist heat, Taping, Manual therapy, and Re-evaluation.  PLAN FOR NEXT SESSION: Aquatic therapy to follow.  Establish HEP when on land including core and postural strengthening.    Ronny Flurry, PT 08/15/2021, 11:40 PM

## 2021-08-18 ENCOUNTER — Encounter (HOSPITAL_BASED_OUTPATIENT_CLINIC_OR_DEPARTMENT_OTHER): Payer: Self-pay | Admitting: Physical Therapy

## 2021-08-18 ENCOUNTER — Ambulatory Visit (HOSPITAL_BASED_OUTPATIENT_CLINIC_OR_DEPARTMENT_OTHER): Payer: Medicare Other | Admitting: Physical Therapy

## 2021-08-18 DIAGNOSIS — M79605 Pain in left leg: Secondary | ICD-10-CM

## 2021-08-18 DIAGNOSIS — M6281 Muscle weakness (generalized): Secondary | ICD-10-CM

## 2021-08-18 DIAGNOSIS — M5459 Other low back pain: Secondary | ICD-10-CM

## 2021-08-18 DIAGNOSIS — R293 Abnormal posture: Secondary | ICD-10-CM | POA: Diagnosis not present

## 2021-08-18 NOTE — Therapy (Signed)
OUTPATIENT PHYSICAL THERAPY TREATMENT NOTE   Patient Name: [name] [name] MRN: [id number] DOB:[birth year], 76 y.o., female Today's Date: 08/18/2021  END OF SESSION:   PT End of Session - 08/18/21 0845     Visit Number 2    Number of Visits 12    Date for PT Re-Evaluation 09/26/21    Authorization Type Medicare A and B    PT Start Time 0845    PT Stop Time 0927    PT Time Calculation (min) 42 min    Activity Tolerance Patient tolerated treatment well    Behavior During Therapy WFL for tasks assessed/performed             Past Medical History:  Diagnosis Date   Alcohol abuse    Anxiety    takes Valium daily as needed and Ativan daily   Bilateral hearing loss    Bipolar 1 disorder ([place])    takes Lithium nightly and Synthroid daily   Chronic back pain    DDD; "all over" (09/14/2017)   Colitis, ischemic ([place]) 2012   Confusion    r/t meds   Depression    takes Prozac daily and Bupspirone    Diverticulosis    Dyslipidemia    takes Crestor daily   Fibromyalgia    Gastroparesis    Headache    "weekly" (09/14/2017)   Hepatic steatosis 06/18/12   severe   Hyperlipidemia    Hypertension    Hypothyroidism    IBS (irritable bowel syndrome)    Ischemic colitis ([place])    Joint pain    Joint swelling    Lupus erythematosus tumidus    tumid-skin   Migraine    "1-2/yr; maybe" (09/14/2017)   Numbness    in right foot   Osteoarthritis    "all over" (09/14/2017)   Osteoarthritis cervical spine    Osteoarthritis of hand    bilateral   Pneumonia    "walking pneumonia several times; long time since the last time" (09/14/2017)   Restless leg syndrome    takes Requip nightly   Sciatica    Type II diabetes mellitus ([place])    Urinary frequency    Urinary leakage    Urinary urgency    Urinary, incontinence, stress female    Walking pneumonia    last time more than 52yr ago   Past Surgical History:  Procedure Laterality Date   ABDOMINAL HYSTERECTOMY     "they left my  ovaries"   APPENDECTOMY     BALLOON DILATION N/A 06/14/2020   Procedure: BALLOON DILATION;  Surgeon: KRonnette Juniper MD;  Location: [facility];  Service: Gastroenterology;  Laterality: N/A;   BIOPSY  06/14/2020   Procedure: BIOPSY;  Surgeon: KRonnette Juniper MD;  Location: [facility];  Service: Gastroenterology;;   CARDIAC CATHETERIZATION  09/14/2017   COLON RESECTION  05/2021   COLONOSCOPY     DENTAL SURGERY Left 10/2016   dental implant   DILATION AND CURETTAGE OF UTERUS  X 4   ESOPHAGOGASTRODUODENOSCOPY     ESOPHAGOGASTRODUODENOSCOPY (EGD) WITH PROPOFOL N/A 06/14/2020   Procedure: ESOPHAGOGASTRODUODENOSCOPY (EGD) WITH PROPOFOL;  Surgeon: KRonnette Juniper MD;  Location: [facility];  Service: Gastroenterology;  Laterality: N/A;   FLEXIBLE SIGMOIDOSCOPY N/A 06/21/2012   Procedure: FLEXIBLE SIGMOIDOSCOPY;  Surgeon: JJerene Bears MD;  Location: [facility];  Service: Gastroenterology;  Laterality: N/A;   JOINT REPLACEMENT     LEFT HEART CATH AND CORONARY ANGIOGRAPHY N/A 09/14/2017   Procedure: LEFT HEART CATH AND CORONARY ANGIOGRAPHY;  Surgeon: Belva Crome, MD;  Location: [facility] [place];  Service: Cardiovascular;  Laterality: N/A;   SHOULDER ARTHROSCOPY Right    "shaved spurs off rotator cuff"   TONSILLECTOMY     TOTAL HIP ARTHROPLASTY Right 06/09/2013   Procedure: TOTAL HIP ARTHROPLASTY;  Surgeon: Kerin Salen, MD;  Location: [facility];  Service: Orthopedics;  Laterality: Right;   TOTAL KNEE ARTHROPLASTY Left 02/07/2019   Procedure: TOTAL KNEE ARTHROPLASTY;  Surgeon: Paralee Cancel, MD;  Location: [facility];  Service: Orthopedics;  Laterality: Left;  70 mins   TUBAL LIGATION     TUMOR EXCISION Right 1968   angle of jaw; benign   Patient Active Problem List   Diagnosis Date Noted   C. difficile colitis 06/13/2021   Type 2 diabetes mellitus with diabetic polyneuropathy, with long-term current use of insulin ([place]) 01/27/2021   Memory disorder 01/27/2021   Bipolar 1 disorder, depressed  ([place]) 01/27/2021   PTSD (post-traumatic stress disorder) 01/27/2021   Intractable abdominal pain 07/10/2020   Pancreatic abnormality 07/10/2020   Diabetes mellitus type 2 in obese ([place]) 07/10/2020   Essential hypertension 07/10/2020   Bipolar 1 disorder, mixed, moderate ([place])    Suicidal ideations    Overweight (BMI 25.0-29.9) 02/08/2019   S/P left TKA 02/07/2019   Chest pain    Chest pain with normal coronary angiography    Diverticulitis 11/19/2016   Hypothyroidism 07/14/2016   Diarrhea    Generalized abdominal pain    Major depressive disorder, recurrent severe without psychotic features ([place]) 12/31/2015   Nausea without vomiting    Colitis 10/28/2015   Unintentional poisoning by psychotropic drug 07/05/2015   Restless leg syndrome 07/05/2015   Peripheral sensory neuropathy due to type 2 diabetes mellitus ([place]) 09/21/2014   Arthritis of left hip 06/09/2013   Arthritis of right hip 06/08/2013   Hip pain, right 05/02/2013   IBS (irritable bowel syndrome) 01/02/2013   Unspecified vitamin D deficiency 07/05/2012   Pure hyperglyceridemia 07/04/2012   Diabetes mellitus with diabetic neuropathy, without long-term current use of insulin ([place]) 06/18/2012   Hypertension 06/18/2012   Bipolar 1 disorder ([place]) 06/18/2012   Low back pain 02/25/2011   Acute ischemic colitis ([place]) 01/06/2011   Sciatica 12/19/2010   Fibromyalgia 10/28/2010   Primary osteoarthritis of right hip 10/24/2010   Hyperlipidemia with target low density lipoprotein (LDL) cholesterol less than 100 mg/dL 05/05/2010   Anxiety and depression 05/05/2010   ABUSE, ALCOHOL, IN REMISSION 05/05/2010   OTITIS MEDIA, CHRONIC 05/05/2010   Hearing loss 05/05/2010   ALLERGIC RHINITIS, SEASONAL, MILD 05/05/2010   URINARY INCONTINENCE, STRESS, FEMALE 05/05/2010   OSTEOARTHRITIS, HANDS, BILATERAL 05/05/2010   OSTEOARTHRITIS, CERVICAL SPINE 05/05/2010       REFERRING PROVIDER: Frederik Pear, MD    REFERRING DIAG: Low Back  Pain with Radiculopathy   Rationale for Evaluation and Treatment Rehabilitation   THERAPY DIAG:  Other low back pain   Pain in left leg   Muscle weakness (generalized)   Abnormal posture   ONSET DATE: Chronic pain / 07/22/2021 (Exacerbation)   SUBJECTIVE:  SUBJECTIVE STATEMENT:  Pt reports no new changes since eval last week.    PAIN:  Are you having pain? Yes:  NPRS:  4/10 current  Location:  L sided lumbar pain down L LE to her knee Type: Constant, dull, aching, sharp stabbing   PERTINENT HISTORY:  -Chronic back pain, grade 1 spondylolisthesis of L4 on L5, memory loss, Fibromyalgia, OA, depression and  anxiety, osteopenia, bipolar disorder, DM type 2       -PSHx:  R THR with posterolateral approach in 2015, L TKR in 2020, R shoulder surgery 2018     PRECAUTIONS: Other: Memory loss, R THR, grade 1 spondylolisthesis of L4 on L5, fibromyalgia, L TKR   WEIGHT BEARING RESTRICTIONS No   FALLS:  Has patient fallen in last 6 months? No   LIVING ENVIRONMENT: Lives with: lives with their spouse Lives in: 2 stairs Stairs: yes Has following equipment at home: Single point cane and Walker - 2 wheeled   OCCUPATION: Pt is retired   PLOF: Independent except has a housekeeper to help with household chores especially heavier chores; Pt ambulates with a cane.  Pt had improved mobility and was able to perform daily act's with much less pain compared to onset.     PATIENT GOALS improve pain, improve mobility, to be able to do more things     OBJECTIVE:  *Findings taken at [facility] unless otherwise noted.    DIAGNOSTIC FINDINGS:  X ray in 2016 indicates:   Slight degenerative grade 1 spondylolisthesis of L4 on L5 approximating 7 mm, unchanged since the MRI. Lumbar scoliosis convex right. No fractures.  Mild disc space narrowing at L3-4, unchanged since prior MRI. Remaining disc spaces well preserved. No pars defects. Mild to moderate facet degenerative changes at L3-4, L4-5 and L5-S1. Visualized sacroiliac joints intact.   IMPRESSION: 1. No acute osseous abnormality. 2. Stable lumbar scoliosis convex right, mild degenerative disc disease at L3-4, and mild to moderate facet degenerative changes at L3-4, L4-5 and L5-S1.   PATIENT SURVEYS:  FOTO pt filled out, but score not available.  May have to repeat next visit.    SCREENING FOR RED FLAGS: Bowel or bladder incontinence: Pt has had issues with urinary incontinence though has control over it now.  Spinal tumors: No Cauda equina syndrome: No Compression fracture: No Abdominal aneurysm: No   COGNITION:           Overall cognitive status: Appears WFL.  Pt has memory loss per MD notes.                 SENSATION: 2+ sensation to bilat LE's.     POSTURE:  significant FHP, rounded shoulders.  R convex curve at lumbar.      LUMBAR ROM:    Active  AROM  eval  Flexion WFL  Extension    Right lateral flexion 75% with lumbar and R LE pain  Left lateral flexion 75% with lumbar  Right rotation WFL with pain  Left rotation 75% with pain   (Blank rows = not tested)   LOWER EXTREMITY ROM:      Active  Right eval Left eval  Hip flexion      Hip extension      Hip abduction      Hip adduction      Hip internal rotation      Hip external rotation Limited worse on R limited  Knee flexion      Knee extension      Ankle  dorsiflexion      Ankle plantarflexion      Ankle inversion      Ankle eversion       (Blank rows = not tested)   LOWER EXTREMITY MMT:     MMT Right eval Left eval  Hip flexion 5/5 5/5  Hip extension      Hip abduction      Hip adduction      Hip internal rotation      Hip external rotation Unable to tolerate resistance 3+/5  Knee flexion 4/5 5/5  Knee extension 5/5 4-/5  Ankle dorsiflexion 5/5 5/5   Ankle plantarflexion WFL, tested in sitting WFL, tested in sitting  Ankle inversion      Ankle eversion       (Blank rows = not tested)       GAIT: Comments: Pt ambulates with SPC on R with forward flexed posture and increased forward head.  Slow gait speed       TODAY'S TREATMENT  Pt seen for aquatic therapy today.  Treatment took place in water 3.25-4 ft in depth at the [facility] pool. Temp of water was 91.  Pt entered/exited the pool via stairs with step-to pattern independently with bilat rail.  She leaves SPC at bench and walks to pool with SBA.  Into to aquatics setting Walking forward and backward; side stepping with UE on yellow noodle -> barbell with CGA -> close SBA for increased confidence and safety Holding onto wall:  heel raises; squats; hip abdct/add; hip flex/ext leg swings Holding onto short blue noodle:  arm pull downs to LEs for core engagement Holding onto rails at stairs:  L forward step up x 12; L lateral step up x 10;  lumbar flexion back stretch   Pt requires buoyancy for support and to offload joints with strengthening exercises. Viscosity of the water is needed for resistance of strengthening; water current perturbations provides challenge to standing balance unsupported, requiring increased core activation.      PATIENT EDUCATION:  Education details: Educated pt in aquatic rehab process.  Educated pt in aquatic therapy benefits, purpose, and properties.  Dx, objective findings, POC, relevant anatomy, and rationale of exercises.   Person educated: Patient Education method: Explanation Education comprehension: verbalized understanding and needs further education     HOME EXERCISE PROGRAM: Will give at a later date   ASSESSMENT:   CLINICAL IMPRESSION: Pt requires CGA/ SBA with therapist in water for improved balance and confidence in setting.  She requires moderate cues for neutral head position and more erect posture throughout session.   She reported reduction of symptoms in back by 2 points and less radicular symptoms.  She avoids use of LLE with stairs; reports fatigue with step ups.   Pt may benefit from skilled PT services to address impairments and to improve overall function.     OBJECTIVE IMPAIRMENTS Abnormal gait, decreased activity tolerance, decreased endurance, decreased mobility, difficulty walking, decreased ROM, decreased strength, hypomobility, postural dysfunction, and pain.    ACTIVITY LIMITATIONS carrying, bending, standing, squatting, stairs, and locomotion level   PARTICIPATION LIMITATIONS: cleaning, shopping, and community activity   PERSONAL FACTORS 3+ comorbidities: memory loss, Fibromyalgia, OA, depression and  anxiety, bipolar disorder, DM type 2, R THR, and L TKA       and chronic lumbar pain  are also affecting patient's functional outcome.    REHAB POTENTIAL: Fair multiple co-morbidities, chronic pain, and response to prior PT   CLINICAL DECISION MAKING: Evolving/moderate complexity  EVALUATION COMPLEXITY: Moderate     GOALS:   SHORT TERM GOALS: Target date: 09/05/2021   Pt will tolerate aquatic therapy without adverse effects for improved pain, strength, and tolerance to activity.  Baseline: Goal status: INITIAL   2.  Pt will report at least a 25% improvement in pain with ambulation and standing activities.  Baseline:  Goal status: INITIAL   3.  Pt will demo good understanding of land based HEP for improved core and postural strength, pain, and function. Baseline:  Goal status: INITIAL Target date: 09/13/2021     4.  Pt will report her worst pain to be no > 8/10 for improved mobility. Baseline:  Goal status: INITIAL Target date: 09/13/2021   LONG TERM GOALS: Target date: 09/26/2021   Pt will demo improved core strength as evidenced by progressing with aquatic exercises without adverse effects for improved tolerance with and performance of functional mobility.  Baseline:  Goal  status: INITIAL   2.  Pt will report at least a 70% improvement in pain and sx's overall. Baseline:  Goal status: INITIAL   3.  Pt will report she is able to perform her normal household chores without significant pain. Baseline:  Goal status: INITIAL   4.  Pt will be able to ambulate her normal community distance without significant pain.  Baseline:  Goal status: INITIAL       PLAN: PT FREQUENCY: 2x/week   PT DURATION: 6 weeks   PLANNED INTERVENTIONS: Therapeutic exercises, Therapeutic activity, Neuromuscular re-education, Balance training, Gait training, Patient/Family education, Stair training, Aquatic Therapy, Electrical stimulation, Cryotherapy, Moist heat, Taping, Manual therapy, and Re-evaluation.   PLAN FOR NEXT SESSION:  Aquatic therapy.  Establish HEP when on land including core, LLE, and postural strengthening.    Kerin Perna, PTA 08/18/21 9:44 AM

## 2021-08-20 ENCOUNTER — Encounter (HOSPITAL_BASED_OUTPATIENT_CLINIC_OR_DEPARTMENT_OTHER): Payer: Self-pay | Admitting: Physical Therapy

## 2021-08-21 ENCOUNTER — Encounter (HOSPITAL_BASED_OUTPATIENT_CLINIC_OR_DEPARTMENT_OTHER): Payer: Self-pay | Admitting: Physical Therapy

## 2021-08-28 DIAGNOSIS — E785 Hyperlipidemia, unspecified: Secondary | ICD-10-CM | POA: Diagnosis not present

## 2021-08-28 DIAGNOSIS — K589 Irritable bowel syndrome without diarrhea: Secondary | ICD-10-CM | POA: Diagnosis not present

## 2021-08-28 DIAGNOSIS — E1165 Type 2 diabetes mellitus with hyperglycemia: Secondary | ICD-10-CM | POA: Diagnosis not present

## 2021-08-29 ENCOUNTER — Telehealth: Payer: Self-pay | Admitting: Psychiatry

## 2021-08-29 DIAGNOSIS — F319 Bipolar disorder, unspecified: Secondary | ICD-10-CM

## 2021-08-29 DIAGNOSIS — F411 Generalized anxiety disorder: Secondary | ICD-10-CM

## 2021-08-29 DIAGNOSIS — F5101 Primary insomnia: Secondary | ICD-10-CM

## 2021-08-29 MED ORDER — OLANZAPINE 2.5 MG PO TABS: 2.5000 mg | ORAL_TABLET | Freq: Every day | ORAL | 0 refills | Status: DC

## 2021-08-29 NOTE — Telephone Encounter (Signed)
Patient notified of recommendation and Rx.

## 2021-08-29 NOTE — Telephone Encounter (Signed)
Please advise her to re-start Olanzapine 2.5 mg QHS to help with manic symptoms since this has been helpful in the past. Script has been sent to [facility] in case she does not have any remaining.

## 2021-09-03 DIAGNOSIS — R112 Nausea with vomiting, unspecified: Secondary | ICD-10-CM | POA: Diagnosis not present

## 2021-09-03 DIAGNOSIS — R109 Unspecified abdominal pain: Secondary | ICD-10-CM | POA: Diagnosis not present

## 2021-09-05 DIAGNOSIS — E785 Hyperlipidemia, unspecified: Secondary | ICD-10-CM | POA: Diagnosis not present

## 2021-09-05 DIAGNOSIS — E1165 Type 2 diabetes mellitus with hyperglycemia: Secondary | ICD-10-CM | POA: Diagnosis not present

## 2021-09-05 DIAGNOSIS — K219 Gastro-esophageal reflux disease without esophagitis: Secondary | ICD-10-CM | POA: Diagnosis not present

## 2021-09-05 DIAGNOSIS — I10 Essential (primary) hypertension: Secondary | ICD-10-CM | POA: Diagnosis not present

## 2021-09-05 DIAGNOSIS — E039 Hypothyroidism, unspecified: Secondary | ICD-10-CM | POA: Diagnosis not present

## 2021-09-10 ENCOUNTER — Emergency Department (HOSPITAL_BASED_OUTPATIENT_CLINIC_OR_DEPARTMENT_OTHER)
Admission: EM | Admit: 2021-09-10 | Discharge: 2021-09-10 | Disposition: A | Discharge status: Home / Self Care | Payer: Medicare Other | Attending: Emergency Medicine | Admitting: Emergency Medicine

## 2021-09-10 ENCOUNTER — Encounter (HOSPITAL_BASED_OUTPATIENT_CLINIC_OR_DEPARTMENT_OTHER): Payer: Self-pay

## 2021-09-10 ENCOUNTER — Other Ambulatory Visit: Payer: Self-pay

## 2021-09-10 ENCOUNTER — Emergency Department (HOSPITAL_BASED_OUTPATIENT_CLINIC_OR_DEPARTMENT_OTHER): Payer: Medicare Other

## 2021-09-10 ENCOUNTER — Ambulatory Visit (HOSPITAL_BASED_OUTPATIENT_CLINIC_OR_DEPARTMENT_OTHER): Payer: Medicare Other | Admitting: Physical Therapy

## 2021-09-10 DIAGNOSIS — R110 Nausea: Secondary | ICD-10-CM | POA: Diagnosis not present

## 2021-09-10 DIAGNOSIS — Z794 Long term (current) use of insulin: Secondary | ICD-10-CM | POA: Insufficient documentation

## 2021-09-10 DIAGNOSIS — I700 Atherosclerosis of aorta: Secondary | ICD-10-CM | POA: Diagnosis not present

## 2021-09-10 DIAGNOSIS — R109 Unspecified abdominal pain: Secondary | ICD-10-CM | POA: Diagnosis not present

## 2021-09-10 DIAGNOSIS — R1032 Left lower quadrant pain: Secondary | ICD-10-CM | POA: Insufficient documentation

## 2021-09-10 LAB — URINALYSIS, ROUTINE W REFLEX MICROSCOPIC
Bilirubin Urine: NEGATIVE
Glucose, UA: NEGATIVE mg/dL
Hgb urine dipstick: NEGATIVE
Ketones, ur: NEGATIVE mg/dL
Nitrite: NEGATIVE
Protein, ur: NEGATIVE mg/dL
Specific Gravity, Urine: 1.022 (ref 1.005–1.030)
pH: 6 (ref 5.0–8.0)

## 2021-09-10 LAB — CBC
HCT: 43.8 % (ref 36.0–46.0)
Hemoglobin: 13.8 g/dL (ref 12.0–15.0)
MCH: 28.8 pg (ref 26.0–34.0)
MCHC: 31.5 g/dL (ref 30.0–36.0)
MCV: 91.4 fL (ref 80.0–100.0)
Platelets: 224 10*3/uL (ref 150–400)
RBC: 4.79 MIL/uL (ref 3.87–5.11)
RDW: 13.4 % (ref 11.5–15.5)
WBC: 8.3 10*3/uL (ref 4.0–10.5)
nRBC: 0 % (ref 0.0–0.2)

## 2021-09-10 LAB — COMPREHENSIVE METABOLIC PANEL
ALT: 16 U/L (ref 0–44)
AST: 15 U/L (ref 15–41)
Albumin: 4.5 g/dL (ref 3.5–5.0)
Alkaline Phosphatase: 103 U/L (ref 38–126)
Anion gap: 11 (ref 5–15)
BUN: 8 mg/dL (ref 8–23)
CO2: 23 mmol/L (ref 22–32)
Calcium: 10.4 mg/dL — ABNORMAL HIGH (ref 8.9–10.3)
Chloride: 106 mmol/L (ref 98–111)
Creatinine, Ser: 0.86 mg/dL (ref 0.44–1.00)
GFR, Estimated: >60 mL/min (ref >60)
Glucose, Bld: 167 mg/dL — ABNORMAL HIGH (ref 70–99)
Potassium: 4.2 mmol/L (ref 3.5–5.1)
Sodium: 140 mmol/L (ref 135–145)
Total Bilirubin: 0.3 mg/dL (ref 0.3–1.2)
Total Protein: 7.4 g/dL (ref 6.5–8.1)

## 2021-09-10 MED ORDER — FENTANYL CITRATE PF 50 MCG/ML IJ SOSY
50.0000 ug | PREFILLED_SYRINGE | Freq: Once | INTRAMUSCULAR | Status: AC
  Administered 2021-09-10: 50 ug via INTRAVENOUS
  Filled 2021-09-10: qty 1

## 2021-09-10 MED ORDER — ONDANSETRON HCL 4 MG/2ML IJ SOLN
4.0000 mg | Freq: Once | INTRAMUSCULAR | Status: AC
  Administered 2021-09-10: 4 mg via INTRAVENOUS
  Filled 2021-09-10: qty 2

## 2021-09-10 NOTE — ED Notes (Signed)
Patient transported to CT 

## 2021-09-10 NOTE — ED Notes (Signed)
Specimen cup provided in triage, pt encouraged to provide urine per MD order

## 2021-09-10 NOTE — ED Triage Notes (Signed)
Pt to ED c/o abdominal pain x 1 week progressively getting worse. Reports hx diverticulitis and feels the same. Reports nausea.

## 2021-09-10 NOTE — ED Provider Notes (Signed)
[facility] Provider Note   CSN: [id number] Arrival date & time: 09/10/21  0845     History  Chief Complaint  Patient presents with   Abdominal Pain    [name] [name] is a 76 y.o. female.  HPI Patient reports she has had 1 week of progressively worsening abdominal pain.  She reports is most focused in the left lower abdomen.  Pain is achy and sometimes sharp in quality.  She reports she has been nauseated but has not vomited.  She reports it is made worse by eating.  She denies recent constipation or diarrhea.  No chest pain or shortness of breath.  No pain burning urgency with urination.  Patient reports in the past she has had similar pain with diverticulitis    Home Medications Prior to Admission medications   Medication Sig Start Date End Date Taking? Authorizing Provider  acetaminophen (TYLENOL) 325 MG tablet Take 2 tablets (650 mg total) by mouth every 6 (six) hours as needed. Patient taking differently: Take 650 mg by mouth every 6 (six) hours as needed for mild pain. 02/11/21   Jeanell Sparrow, DO  ALPRAZolam Duanne Moron) 0.5 MG tablet Take 1 tablet (0.5 mg total) by mouth 4 (four) times daily as needed for anxiety. 07/28/21   [name], PMHNP  atorvastatin (LIPITOR) 40 MG tablet Take 40 mg by mouth every evening.    [provider]  b complex vitamins capsule Take 1 capsule by mouth daily.    [provider]  Baclofen 5 MG TABS Take 5 mg by mouth 3 (three) times daily as needed. 07/23/21   Concepcion Living, MD  BD PEN NEEDLE NANO 2ND GEN 32G X 4 MM MISC USE TO INJECT 5 TIMES DAILY AS DIRECTED 04/28/21   Elayne Snare, MD  cholecalciferol (VITAMIN D3) 25 MCG (1000 UNIT) tablet Take 1,000 Units by mouth daily. Also takes 2,000 units capsule at bedtime    [provider]  Cholecalciferol (VITAMIN D3) 50 MCG (2000 UT) TABS Take 2,000 Units by mouth at bedtime. Also takes 1,000 units capsule in the morning    [provider]  CONTOUR NEXT TEST test strip USE TO TEST BLOOD SUGAR TWICE DAILY 08/01/21   Elayne Snare, MD  dicyclomine (BENTYL) 20 MG tablet Take 1 tablet (20 mg total) by mouth 3 (three) times daily as needed for spasms. 06/17/21   Shelly Coss, MD  divalproex (DEPAKOTE) 250 MG DR tablet Take 2 tablets (500 mg total) by mouth at bedtime. 07/03/21   [name], PMHNP  donepezil (ARICEPT) 10 MG tablet Take 1 tablet (10 mg total) by mouth at bedtime. 07/03/21   [name], PMHNP  furosemide (LASIX) 20 MG tablet Take 1 tablet (20 mg total) by mouth daily for 10 days. 07/06/21 08/07/21  Regan Lemming, MD  gabapentin (NEURONTIN) 600 MG tablet Take 600 mg by mouth 3 (three) times daily. 05/12/19   [provider]  insulin lispro (HUMALOG KWIKPEN) 100 UNIT/ML KwikPen ADMINISTER 10 UNITS UNDER THE SKIN BEFORE MEALS Patient taking differently: Inject 10 Units into the skin 3 (three) times daily as needed (high blood sugar). 5-10 units 3 times a day 05/30/21   Elayne Snare, MD  LANTUS SOLOSTAR 100 UNIT/ML Solostar Pen ADMINISTER 64 UNITS UNDER THE SKIN AT BEDTIME Patient taking differently: ADMINISTER 66 UNITS UNDER THE SKIN AT BEDTIME 06/18/21   Elayne Snare, MD  levothyroxine (SYNTHROID) 75 MCG tablet TAKE 1 TABLET BY MOUTH EVERY DAY BEFORE BREAKFAST Patient  taking differently: Take 75 mcg by mouth daily before breakfast. 03/27/21   Elayne Snare, MD  liothyronine (CYTOMEL) 5 MCG tablet TAKE 1 TABLET BY MOUTH DAILY WITH LEVOTHYROXINE. TAKE BEFORE BREAKFAST Patient taking differently: Take 5 mcg by mouth daily. 03/27/21   Elayne Snare, MD  lithium carbonate (ESKALITH) 450 MG CR tablet Take 1 tablet (450 mg total) by mouth at bedtime. 07/03/21   [name], PMHNP  losartan (COZAAR) 25 MG tablet Take 25 mg by mouth every evening. 08/31/19   [provider]  memantine (NAMENDA) 10 MG tablet TAKE 1 TABLET(10 MG) BY MOUTH EVERY EVENING 07/03/21   [name], PMHNP  Microlet Lancets MISC  TEST DAILY AS DIRECTED 08/01/21   Elayne Snare, MD  nitroGLYCERIN (NITROSTAT) 0.4 MG SL tablet Place 1 tablet (0.4 mg total) under the tongue every 5 (five) minutes as needed for chest pain. 09/14/17   Lyda Jester M, PA-C  OLANZapine (ZYPREXA) 2.5 MG tablet Take 1 tablet (2.5 mg total) by mouth at bedtime. 08/29/21   [name], PMHNP  ondansetron (ZOFRAN) 4 MG tablet Take 1 tablet (4 mg total) by mouth every 8 (eight) hours as needed for nausea or vomiting. 06/17/21   Shelly Coss, MD  oxyCODONE (ROXICODONE) 5 MG immediate release tablet Take 1 tablet (5 mg total) by mouth every 6 (six) hours as needed for up to 10 doses for breakthrough pain. 07/04/21   Curatolo, Adam, DO  oxyCODONE-acetaminophen (PERCOCET/ROXICET) 5-325 MG tablet Take 1 tablet by mouth every 6 (six) hours as needed for severe pain. 06/17/21   Shelly Coss, MD  QUEtiapine (SEROQUEL) 200 MG tablet TAKE 1 TAB PO q 5 pm and 1 tab po QHS 07/03/21   [name], PMHNP  rOPINIRole (REQUIP) 0.5 MG tablet Takes 4 tabs at 5 pm and 3 tabs at bedtime and 1-2 tabs prn 07/03/21   [name], PMHNP  tiZANidine (ZANAFLEX) 4 MG tablet Take 1 tablet (4 mg total) by mouth every 6 (six) hours as needed for muscle spasms. 13/08/65   Delora Fuel, MD  triamcinolone (NASACORT) 55 MCG/ACT AERO nasal inhaler Place 2 sprays into the nose daily as needed (allergies).    [provider]  vancomycin (VANCOCIN) 125 MG capsule Take 125 mg by mouth daily. 08/05/21   [provider]      Allergies    Codeine, Procaine hcl, Epinephrine, Aspirin, Augmentin [amoxicillin-pot clavulanate], Benadryl [diphenhydramine], Diflucan [fluconazole], Dilaudid [hydromorphone hcl], Hydromorphone, Morphine, Other, Prednisone, Tramadol hcl, and Sulfa antibiotics    Review of Systems   Review of Systems 10 systems reviewed negative except as per HPI Physical Exam Updated Vital Signs BP (!) 166/74 (BP Location: Right Arm)   Pulse 94   Temp  98.6 F (37 C) (Oral)   Resp 16   Ht '5\' 1"'$  (1.549 m)   Wt 81.6 kg   SpO2 96%   BMI 34.01 kg/m  Physical Exam Constitutional:      Comments: Alert nontoxic clear mental status.  No respiratory distress  HENT:     Head: Normocephalic and atraumatic.     Mouth/Throat:     Pharynx: Oropharynx is clear.  Eyes:     Extraocular Movements: Extraocular movements intact.  Cardiovascular:     Rate and Rhythm: Normal rate and regular rhythm.  Pulmonary:     Effort: Pulmonary effort is normal.     Breath sounds: Normal breath sounds.  Abdominal:     Comments: Abdomen soft.  Moderate localizing tenderness left lower quadrant.  No guarding.  No rash in skin folds.  Musculoskeletal:        General: Normal range of motion.     Comments: Trace edema bilateral lower legs.  Calves nontender  Skin:    General: Skin is warm and dry.  Neurological:     General: No focal deficit present.     Mental Status: She is oriented to person, place, and time.     Coordination: Coordination normal.  Psychiatric:        Mood and Affect: Mood normal.     ED Results / Procedures / Treatments   Labs (all labs ordered are listed, but only abnormal results are displayed) Labs Reviewed  COMPREHENSIVE METABOLIC PANEL - Abnormal; Notable for the following components:      Result Value   Glucose, Bld 167 (*)    Calcium 10.4 (*)    All other components within normal limits  URINALYSIS, ROUTINE W REFLEX MICROSCOPIC - Abnormal; Notable for the following components:   Leukocytes,Ua SMALL (*)    Non Squamous Epithelial 0-5 (*)    All other components within normal limits  CBC    EKG None  Radiology CT ABDOMEN PELVIS WO CONTRAST  Result Date: 09/10/2021 CLINICAL DATA:  Left lower quadrant abdominal pain EXAM: CT ABDOMEN AND PELVIS WITHOUT CONTRAST TECHNIQUE: Multidetector CT imaging of the abdomen and pelvis was performed following the standard protocol without IV contrast. RADIATION DOSE REDUCTION: This  exam was performed according to the departmental dose-optimization program which includes automated exposure control, adjustment of the mA and/or kV according to patient size and/or use of iterative reconstruction technique. COMPARISON:  CT abdomen and pelvis dated June 13, 2021 FINDINGS: Lower chest: No acute abnormality. Hepatobiliary: No focal liver abnormality is seen. No gallstones, gallbladder wall thickening, or biliary dilatation. Pancreas: Unremarkable. No pancreatic ductal dilatation or surrounding inflammatory changes. Spleen: Normal in size without focal abnormality. Adrenals/Urinary Tract: Bilateral adrenal glands are unremarkable. No hydronephrosis. Punctate nonobstructing stone of the lower pole the right kidney. Bladder is unremarkable. Stomach/Bowel: Prior partial sigmoidectomy. Diverticulosis. Normal appearance of the stomach. No bowel wall thickening, inflammatory change or evidence of obstruction. Vascular/Lymphatic: Aortic atherosclerosis. No enlarged abdominal or pelvic lymph nodes. Reproductive: No adnexal masses. Other: No abdominal wall hernia or abnormality. No abdominopelvic ascites. Musculoskeletal: Prior total right hip replacement. No aggressive appearing osseous abnormalities. IMPRESSION: 1. No acute findings in the abdomen or pelvis. 2. Punctate nonobstructing stone of the right kidney. 3.  Aortic Atherosclerosis (ICD10-I70.0). Electronically Signed   By: Yetta Glassman M.D.   On: 09/10/2021 11:23    Procedures Procedures    Medications Ordered in ED Medications  fentaNYL (SUBLIMAZE) injection 50 mcg (50 mcg Intravenous Given 09/10/21 1122)  ondansetron (ZOFRAN) injection 4 mg (4 mg Intravenous Given 09/10/21 1121)    ED Course/ Medical Decision Making/ A&P                           Medical Decision Making Amount and/or Complexity of Data Reviewed Labs: ordered. Radiology: ordered.  Risk Prescription drug management.   Patient presents with abdominal pain for 1  week duration.  Differential diagnosis includes diverticulitis\bowel obstruction\urinary tract infection\kidney stone\aneurysm or vascular emergency.  Will initiate diagnostic evaluation with basic lab work, urinalysis and CT scan.  Patient be treated for pain with fentanyl and Zofran.  At this time, patient is nontoxic without acute distress and stable vital signs.  Labs reviewed and no acute findings.  Urinalysis  without signs of infection.  CT scan reviewed by radiology no acute findings.  Patient is alert and nontoxic.  Abdominal exam is nonsurgical.  At this time consideration is for possible IBS patient has this pre-existing diagnosis as well as gastroparesis.  She is not having any active vomiting or diarrhea.  At this time stable for discharge.  Careful return precautions reviewed.  I recommend close follow-up for recheck.        Final Clinical Impression(s) / ED Diagnoses Final diagnoses:  Left lower quadrant abdominal pain    Rx / DC Orders ED Discharge Orders     None         Charlesetta Shanks, MD 09/10/21 1313

## 2021-09-11 ENCOUNTER — Encounter (HOSPITAL_BASED_OUTPATIENT_CLINIC_OR_DEPARTMENT_OTHER): Payer: Self-pay | Admitting: Physical Therapy

## 2021-09-11 ENCOUNTER — Ambulatory Visit (HOSPITAL_BASED_OUTPATIENT_CLINIC_OR_DEPARTMENT_OTHER): Payer: Medicare Other | Attending: Orthopedic Surgery | Admitting: Physical Therapy

## 2021-09-11 DIAGNOSIS — R293 Abnormal posture: Secondary | ICD-10-CM | POA: Insufficient documentation

## 2021-09-11 DIAGNOSIS — M79605 Pain in left leg: Secondary | ICD-10-CM | POA: Diagnosis not present

## 2021-09-11 DIAGNOSIS — M5459 Other low back pain: Secondary | ICD-10-CM | POA: Insufficient documentation

## 2021-09-11 DIAGNOSIS — M6281 Muscle weakness (generalized): Secondary | ICD-10-CM | POA: Diagnosis not present

## 2021-09-11 NOTE — Therapy (Addendum)
OUTPATIENT PHYSICAL THERAPY TREATMENT NOTE   Patient Name: [name] [name] MRN: [id number] DOB:1945-12-16, 76 y.o., female Today's Date: 09/11/2021  END OF SESSION:   PT End of Session - 09/11/21 1035     Visit Number 3    Number of Visits 12    Date for PT Re-Evaluation 09/26/21    Authorization Type Medicare A and B    PT Start Time 1035    PT Stop Time 1113    PT Time Calculation (min) 38 min    Activity Tolerance Patient tolerated treatment well    Behavior During Therapy WFL for tasks assessed/performed             Past Medical History:  Diagnosis Date   Alcohol abuse    Anxiety    takes Valium daily as needed and Ativan daily   Bilateral hearing loss    Bipolar 1 disorder ([place])    takes Lithium nightly and Synthroid daily   Chronic back pain    DDD; "all over" (09/14/2017)   Colitis, ischemic ([place]) 2012   Confusion    r/t meds   Depression    takes Prozac daily and Bupspirone    Diverticulosis    Dyslipidemia    takes Crestor daily   Fibromyalgia    Gastroparesis    Headache    "weekly" (09/14/2017)   Hepatic steatosis 06/18/12   severe   Hyperlipidemia    Hypertension    Hypothyroidism    IBS (irritable bowel syndrome)    Ischemic colitis ([place])    Joint pain    Joint swelling    Lupus erythematosus tumidus    tumid-skin   Migraine    "1-2/yr; maybe" (09/14/2017)   Numbness    in right foot   Osteoarthritis    "all over" (09/14/2017)   Osteoarthritis cervical spine    Osteoarthritis of hand    bilateral   Pneumonia    "walking pneumonia several times; long time since the last time" (09/14/2017)   Restless leg syndrome    takes Requip nightly   Sciatica    Type II diabetes mellitus ([place])    Urinary frequency    Urinary leakage    Urinary urgency    Urinary, incontinence, stress female    Walking pneumonia    last time more than 75yr ago   Past Surgical History:  Procedure Laterality Date   ABDOMINAL HYSTERECTOMY     "they left my  ovaries"   APPENDECTOMY     BALLOON DILATION N/A 06/14/2020   Procedure: BALLOON DILATION;  Surgeon: KRonnette Juniper MD;  Location: [facility];  Service: Gastroenterology;  Laterality: N/A;   BIOPSY  06/14/2020   Procedure: BIOPSY;  Surgeon: KRonnette Juniper MD;  Location: [facility];  Service: Gastroenterology;;   CARDIAC CATHETERIZATION  09/14/2017   COLON RESECTION  05/2021   COLONOSCOPY     DENTAL SURGERY Left 10/2016   dental implant   DILATION AND CURETTAGE OF UTERUS  X 4   ESOPHAGOGASTRODUODENOSCOPY     ESOPHAGOGASTRODUODENOSCOPY (EGD) WITH PROPOFOL N/A 06/14/2020   Procedure: ESOPHAGOGASTRODUODENOSCOPY (EGD) WITH PROPOFOL;  Surgeon: KRonnette Juniper MD;  Location: [facility];  Service: Gastroenterology;  Laterality: N/A;   FLEXIBLE SIGMOIDOSCOPY N/A 06/21/2012   Procedure: FLEXIBLE SIGMOIDOSCOPY;  Surgeon: JJerene Bears MD;  Location: [facility];  Service: Gastroenterology;  Laterality: N/A;   JOINT REPLACEMENT     LEFT HEART CATH AND CORONARY ANGIOGRAPHY N/A 09/14/2017   Procedure: LEFT HEART CATH AND CORONARY ANGIOGRAPHY;  Surgeon: Belva Crome, MD;  Location: [facility];  Service: Cardiovascular;  Laterality: N/A;   SHOULDER ARTHROSCOPY Right    "shaved spurs off rotator cuff"   TONSILLECTOMY     TOTAL HIP ARTHROPLASTY Right 06/09/2013   Procedure: TOTAL HIP ARTHROPLASTY;  Surgeon: Kerin Salen, MD;  Location: [facility];  Service: Orthopedics;  Laterality: Right;   TOTAL KNEE ARTHROPLASTY Left 02/07/2019   Procedure: TOTAL KNEE ARTHROPLASTY;  Surgeon: Paralee Cancel, MD;  Location: [facility];  Service: Orthopedics;  Laterality: Left;  70 mins   TUBAL LIGATION     TUMOR EXCISION Right 1968   angle of jaw; benign   Patient Active Problem List   Diagnosis Date Noted   C. difficile colitis 06/13/2021   Type 2 diabetes mellitus with diabetic polyneuropathy, with long-term current use of insulin ([place]) 01/27/2021   Memory disorder 01/27/2021   Bipolar 1 disorder, depressed  ([place]) 01/27/2021   PTSD (post-traumatic stress disorder) 01/27/2021   Intractable abdominal pain 07/10/2020   Pancreatic abnormality 07/10/2020   Diabetes mellitus type 2 in obese ([place]) 07/10/2020   Essential hypertension 07/10/2020   Bipolar 1 disorder, mixed, moderate ([place])    Suicidal ideations    Overweight (BMI 25.0-29.9) 02/08/2019   S/P left TKA 02/07/2019   Chest pain    Chest pain with normal coronary angiography    Diverticulitis 11/19/2016   Hypothyroidism 07/14/2016   Diarrhea    Generalized abdominal pain    Major depressive disorder, recurrent severe without psychotic features ([place]) 12/31/2015   Nausea without vomiting    Colitis 10/28/2015   Unintentional poisoning by psychotropic drug 07/05/2015   Restless leg syndrome 07/05/2015   Peripheral sensory neuropathy due to type 2 diabetes mellitus ([place]) 09/21/2014   Arthritis of left hip 06/09/2013   Arthritis of right hip 06/08/2013   Hip pain, right 05/02/2013   IBS (irritable bowel syndrome) 01/02/2013   Unspecified vitamin D deficiency 07/05/2012   Pure hyperglyceridemia 07/04/2012   Diabetes mellitus with diabetic neuropathy, without long-term current use of insulin ([place]) 06/18/2012   Hypertension 06/18/2012   Bipolar 1 disorder ([place]) 06/18/2012   Low back pain 02/25/2011   Acute ischemic colitis ([place]) 01/06/2011   Sciatica 12/19/2010   Fibromyalgia 10/28/2010   Primary osteoarthritis of right hip 10/24/2010   Hyperlipidemia with target low density lipoprotein (LDL) cholesterol less than 100 mg/dL 05/05/2010   Anxiety and depression 05/05/2010   ABUSE, ALCOHOL, IN REMISSION 05/05/2010   OTITIS MEDIA, CHRONIC 05/05/2010   Hearing loss 05/05/2010   ALLERGIC RHINITIS, SEASONAL, MILD 05/05/2010   URINARY INCONTINENCE, STRESS, FEMALE 05/05/2010   OSTEOARTHRITIS, HANDS, BILATERAL 05/05/2010   OSTEOARTHRITIS, CERVICAL SPINE 05/05/2010       REFERRING PROVIDER: Frederik Pear, MD    REFERRING DIAG: Low Back  Pain with Radiculopathy   Rationale for Evaluation and Treatment Rehabilitation   THERAPY DIAG:  Other low back pain   Pain in left leg   Muscle weakness (generalized)   Abnormal posture   ONSET DATE: Chronic pain / 07/22/2021 (Exacerbation)   SUBJECTIVE:  SUBJECTIVE STATEMENT:  Pt reports continued back pain.  She uses ice/heat for pain relief; plans to use TENS.  "I've been catching myself (slouch) and then I try to stand up straighter".   "I did pretty good for awhile (after last session)".   PAIN:  Are you having pain? Yes:  NPRS:  7/10 current  Location:  bilat lumbar, pain travels down L LE to her ankle Type: Constant, dull, aching, sharp stabbing   PERTINENT HISTORY:  -Chronic back pain, grade 1 spondylolisthesis of L4 on L5, memory loss, Fibromyalgia, OA, depression and  anxiety, osteopenia, bipolar disorder, DM type 2       -PSHx:  R THR with posterolateral approach in 2015, L TKR in 2020, R shoulder surgery 2018     PRECAUTIONS: Other: Memory loss, R THR, grade 1 spondylolisthesis of L4 on L5, fibromyalgia, L TKR   WEIGHT BEARING RESTRICTIONS No   FALLS:  Has patient fallen in last 6 months? No   LIVING ENVIRONMENT: Lives with: lives with their spouse Lives in: 2 stairs Stairs: yes Has following equipment at home: Single point cane and Walker - 2 wheeled   OCCUPATION: Pt is retired   PLOF: Independent except has a housekeeper to help with household chores especially heavier chores; Pt ambulates with a cane.  Pt had improved mobility and was able to perform daily act's with much less pain compared to onset.     PATIENT GOALS improve pain, improve mobility, to be able to do more things     OBJECTIVE:  *Findings taken at [facility] unless otherwise noted.    DIAGNOSTIC FINDINGS:   X ray in 2016 indicates:   Slight degenerative grade 1 spondylolisthesis of L4 on L5 approximating 7 mm, unchanged since the MRI. Lumbar scoliosis convex right. No fractures. Mild disc space narrowing at L3-4, unchanged since prior MRI. Remaining disc spaces well preserved. No pars defects. Mild to moderate facet degenerative changes at L3-4, L4-5 and L5-S1. Visualized sacroiliac joints intact.   IMPRESSION: 1. No acute osseous abnormality. 2. Stable lumbar scoliosis convex right, mild degenerative disc disease at L3-4, and mild to moderate facet degenerative changes at L3-4, L4-5 and L5-S1.   PATIENT SURVEYS:  FOTO pt filled out, but score not available.  May have to repeat next visit.    SCREENING FOR RED FLAGS: Bowel or bladder incontinence: Pt has had issues with urinary incontinence though has control over it now.  Spinal tumors: No Cauda equina syndrome: No Compression fracture: No Abdominal aneurysm: No   COGNITION:           Overall cognitive status: Appears WFL.  Pt has memory loss per MD notes.                 SENSATION: 2+ sensation to bilat LE's.     POSTURE:  significant FHP, rounded shoulders.  R convex curve at lumbar.      LUMBAR ROM:    Active  AROM  eval  Flexion WFL  Extension    Right lateral flexion 75% with lumbar and R LE pain  Left lateral flexion 75% with lumbar  Right rotation WFL with pain  Left rotation 75% with pain   (Blank rows = not tested)   LOWER EXTREMITY ROM:      Active  Right eval Left eval  Hip flexion      Hip extension      Hip abduction      Hip adduction      Hip  internal rotation      Hip external rotation Limited worse on R limited  Knee flexion      Knee extension      Ankle dorsiflexion      Ankle plantarflexion      Ankle inversion      Ankle eversion       (Blank rows = not tested)   LOWER EXTREMITY MMT:     MMT Right eval Left eval  Hip flexion 5/5 5/5  Hip extension      Hip abduction       Hip adduction      Hip internal rotation      Hip external rotation Unable to tolerate resistance 3+/5  Knee flexion 4/5 5/5  Knee extension 5/5 4-/5  Ankle dorsiflexion 5/5 5/5  Ankle plantarflexion WFL, tested in sitting WFL, tested in sitting  Ankle inversion      Ankle eversion       (Blank rows = not tested)       GAIT: Comments: Pt ambulates with SPC on R with forward flexed posture and increased forward head.  Slow gait speed       TODAY'S TREATMENT  Pt seen for aquatic therapy today.  Treatment took place in water 3.25-4 ft in depth at the [facility] pool. Temp of water was 91.  Pt entered/exited the pool via stairs with step-to pattern independently with bilat rail.  She leaves SPC at bench and walks to pool with SBA.   Walking forward and backward; side stepping with UE on yellow noodle ->  SBA for increased confidence and safety; cues for upright head  Holding onto wall:  heel raises; squats; hip abdct/add; trunk ext Holding yellow hand buoys:  bilat shoulder Abdct/add (on top of water); row Holding rainbow hand buoys:  bilat push downs at sides x 10 Staggered stance with arms abdct/add x 5 each leg forward  Pt requires buoyancy for support and to offload joints with strengthening exercises. Viscosity of the water is needed for resistance of strengthening; water current perturbations provides challenge to standing balance unsupported, requiring increased core activation.      PATIENT EDUCATION:  Education details: Educated pt in aquatic rehab process.  Educated pt in aquatic therapy benefits, purpose, and properties.  Dx, objective findings, POC, relevant anatomy, and rationale of exercises.   Person educated: Patient Education method: Explanation Education comprehension: verbalized understanding and needs further education     HOME EXERCISE PROGRAM: Will give at a later date   ASSESSMENT:   CLINICAL IMPRESSION: Pt no longer requires CGA; much more  confident this session, with no LOB.    SBA with therapist in water for safety; may be able to attempt taking direction from therapist on deck next visit.  She is less confident and guarded with backwards gait in pool. She requires moderate cues for neutral head position and more erect posture throughout session.  She reported reduction of symptoms in back, including radicular symptoms while submerged at 4 ft. Pt has been away due to surgery and vacation, but voices she is motivated to return to more regular appt schedule.  Goals are ongoing.   OBJECTIVE IMPAIRMENTS Abnormal gait, decreased activity tolerance, decreased endurance, decreased mobility, difficulty walking, decreased ROM, decreased strength, hypomobility, postural dysfunction, and pain.    ACTIVITY LIMITATIONS carrying, bending, standing, squatting, stairs, and locomotion level   PARTICIPATION LIMITATIONS: cleaning, shopping, and community activity   PERSONAL FACTORS 3+ comorbidities: memory loss, Fibromyalgia, OA, depression and  anxiety, bipolar disorder, DM type 2, R THR, and L TKA       and chronic lumbar pain  are also affecting patient's functional outcome.    REHAB POTENTIAL: Fair multiple co-morbidities, chronic pain, and response to prior PT   CLINICAL DECISION MAKING: Evolving/moderate complexity   EVALUATION COMPLEXITY: Moderate     GOALS:   SHORT TERM GOALS: Target date: 09/05/2021   Pt will tolerate aquatic therapy without adverse effects for improved pain, strength, and tolerance to activity.  Baseline: Goal status: INITIAL   2.  Pt will report at least a 25% improvement in pain with ambulation and standing activities.  Baseline:  Goal status: Ongoing   3.  Pt will demo good understanding of land based HEP for improved core and postural strength, pain, and function. Baseline:  Goal status: INITIAL Target date: 09/13/2021     4.  Pt will report her worst pain to be no > 8/10 for improved mobility. Baseline:  goes up to 10/10 - 09/11/21 Goal status: ongoing Target date: 09/13/2021   LONG TERM GOALS: Target date: 09/26/2021   Pt will demo improved core strength as evidenced by progressing with aquatic exercises without adverse effects for improved tolerance with and performance of functional mobility.  Baseline:  Goal status: INITIAL   2.  Pt will report at least a 70% improvement in pain and sx's overall. Baseline:  Goal status: INITIAL   3.  Pt will report she is able to perform her normal household chores without significant pain. Baseline:  Goal status: INITIAL   4.  Pt will be able to ambulate her normal community distance without significant pain.  Baseline:  Goal status: INITIAL       PLAN: PT FREQUENCY: 2x/week   PT DURATION: 6 weeks   PLANNED INTERVENTIONS: Therapeutic exercises, Therapeutic activity, Neuromuscular re-education, Balance training, Gait training, Patient/Family education, Stair training, Aquatic Therapy, Electrical stimulation, Cryotherapy, Moist heat, Taping, Manual therapy, and Re-evaluation.   PLAN FOR NEXT SESSION:  Aquatic therapy- add stairs, especially with LLE (functionally weaker).  Establish HEP when on land including core, LLE, and postural strengthening. POC ends 09/26/21.  Kerin Perna, PTA 09/11/21 11:23 AM  PHYSICAL THERAPY DISCHARGE SUMMARY  Visits from Start of Care: 3  Current functional level related to goals / functional outcomes: Unable to assess current functional status or goals due to pt not being present at discharge.     Remaining deficits: Unable to assess due to pt not being present at discharge.     Education / Equipment:    Patient was seen in PT from 08/15/21 - 09/11/21.  Pt cancelled her appt on 09/26/21 due to having health issues.  She wanted to be placed on hold due to health issues.  PT has not heard back from from Pt and she will be considered discharged from skilled PT services.    Selinda Michaels III PT,  DPT 05/08/22 12:09 PM

## 2021-09-14 DIAGNOSIS — M5442 Lumbago with sciatica, left side: Secondary | ICD-10-CM | POA: Diagnosis not present

## 2021-09-16 ENCOUNTER — Ambulatory Visit (HOSPITAL_BASED_OUTPATIENT_CLINIC_OR_DEPARTMENT_OTHER): Payer: Medicare Other | Admitting: Physical Therapy

## 2021-09-17 ENCOUNTER — Ambulatory Visit (HOSPITAL_BASED_OUTPATIENT_CLINIC_OR_DEPARTMENT_OTHER): Payer: Medicare Other | Admitting: Physical Therapy

## 2021-09-19 ENCOUNTER — Ambulatory Visit (HOSPITAL_BASED_OUTPATIENT_CLINIC_OR_DEPARTMENT_OTHER): Payer: Medicare Other | Admitting: Physical Therapy

## 2021-09-20 ENCOUNTER — Emergency Department (HOSPITAL_BASED_OUTPATIENT_CLINIC_OR_DEPARTMENT_OTHER): Payer: Medicare Other

## 2021-09-20 ENCOUNTER — Emergency Department (HOSPITAL_BASED_OUTPATIENT_CLINIC_OR_DEPARTMENT_OTHER)
Admission: EM | Admit: 2021-09-20 | Discharge: 2021-09-20 | Disposition: A | Discharge status: Home / Self Care | Payer: Medicare Other | Attending: Emergency Medicine | Admitting: Emergency Medicine

## 2021-09-20 ENCOUNTER — Encounter (HOSPITAL_BASED_OUTPATIENT_CLINIC_OR_DEPARTMENT_OTHER): Payer: Self-pay | Admitting: Emergency Medicine

## 2021-09-20 ENCOUNTER — Other Ambulatory Visit: Payer: Self-pay

## 2021-09-20 DIAGNOSIS — R110 Nausea: Secondary | ICD-10-CM | POA: Diagnosis not present

## 2021-09-20 DIAGNOSIS — Z79899 Other long term (current) drug therapy: Secondary | ICD-10-CM | POA: Insufficient documentation

## 2021-09-20 DIAGNOSIS — N200 Calculus of kidney: Secondary | ICD-10-CM | POA: Diagnosis not present

## 2021-09-20 DIAGNOSIS — K5730 Diverticulosis of large intestine without perforation or abscess without bleeding: Secondary | ICD-10-CM | POA: Diagnosis not present

## 2021-09-20 DIAGNOSIS — N281 Cyst of kidney, acquired: Secondary | ICD-10-CM | POA: Diagnosis not present

## 2021-09-20 DIAGNOSIS — R109 Unspecified abdominal pain: Secondary | ICD-10-CM | POA: Diagnosis present

## 2021-09-20 DIAGNOSIS — R1084 Generalized abdominal pain: Secondary | ICD-10-CM | POA: Diagnosis not present

## 2021-09-20 DIAGNOSIS — Z794 Long term (current) use of insulin: Secondary | ICD-10-CM | POA: Diagnosis not present

## 2021-09-20 DIAGNOSIS — N3289 Other specified disorders of bladder: Secondary | ICD-10-CM | POA: Diagnosis not present

## 2021-09-20 LAB — COMPREHENSIVE METABOLIC PANEL
ALT: 16 U/L (ref 0–44)
AST: 16 U/L (ref 15–41)
Albumin: 4.5 g/dL (ref 3.5–5.0)
Alkaline Phosphatase: 82 U/L (ref 38–126)
Anion gap: 12 (ref 5–15)
BUN: 15 mg/dL (ref 8–23)
CO2: 20 mmol/L — ABNORMAL LOW (ref 22–32)
Calcium: 10.8 mg/dL — ABNORMAL HIGH (ref 8.9–10.3)
Chloride: 107 mmol/L (ref 98–111)
Creatinine, Ser: 0.79 mg/dL (ref 0.44–1.00)
GFR, Estimated: >60 mL/min (ref >60)
Glucose, Bld: 145 mg/dL — ABNORMAL HIGH (ref 70–99)
Potassium: 4.1 mmol/L (ref 3.5–5.1)
Sodium: 139 mmol/L (ref 135–145)
Total Bilirubin: 0.3 mg/dL (ref 0.3–1.2)
Total Protein: 7.4 g/dL (ref 6.5–8.1)

## 2021-09-20 LAB — LIPASE, BLOOD: Lipase: 27 U/L (ref 11–51)

## 2021-09-20 LAB — URINALYSIS, ROUTINE W REFLEX MICROSCOPIC
Bilirubin Urine: NEGATIVE
Glucose, UA: NEGATIVE mg/dL
Hgb urine dipstick: NEGATIVE
Ketones, ur: NEGATIVE mg/dL
Leukocytes,Ua: NEGATIVE
Nitrite: NEGATIVE
Protein, ur: NEGATIVE mg/dL
Specific Gravity, Urine: 1.015 (ref 1.005–1.030)
pH: 6 (ref 5.0–8.0)

## 2021-09-20 LAB — CBC WITH DIFFERENTIAL/PLATELET
Abs Immature Granulocytes: 0.07 10*3/uL (ref 0.00–0.07)
Basophils Absolute: 0.1 10*3/uL (ref 0.0–0.1)
Basophils Relative: 1 %
Eosinophils Absolute: 0.3 10*3/uL (ref 0.0–0.5)
Eosinophils Relative: 3 %
HCT: 42.9 % (ref 36.0–46.0)
Hemoglobin: 13.7 g/dL (ref 12.0–15.0)
Immature Granulocytes: 1 %
Lymphocytes Relative: 22 %
Lymphs Abs: 2.2 10*3/uL (ref 0.7–4.0)
MCH: 29.1 pg (ref 26.0–34.0)
MCHC: 31.9 g/dL (ref 30.0–36.0)
MCV: 91.3 fL (ref 80.0–100.0)
Monocytes Absolute: 0.6 10*3/uL (ref 0.1–1.0)
Monocytes Relative: 6 %
Neutro Abs: 6.9 10*3/uL (ref 1.7–7.7)
Neutrophils Relative %: 67 %
Platelets: 221 10*3/uL (ref 150–400)
RBC: 4.7 MIL/uL (ref 3.87–5.11)
RDW: 13.8 % (ref 11.5–15.5)
WBC: 10.1 10*3/uL (ref 4.0–10.5)
nRBC: 0 % (ref 0.0–0.2)

## 2021-09-20 MED ORDER — LIDOCAINE HCL (PF) 1 % IJ SOLN
INTRAMUSCULAR | Status: AC
  Filled 2021-09-20: qty 5

## 2021-09-20 MED ORDER — ONDANSETRON HCL 4 MG PO TABS: 4.0000 mg | ORAL_TABLET | Freq: Four times a day (QID) | ORAL | 0 refills | Status: AC

## 2021-09-20 MED ORDER — ONDANSETRON 4 MG PO TBDP
4.0000 mg | ORAL_TABLET | Freq: Once | ORAL | Status: AC
  Administered 2021-09-20: 4 mg via ORAL
  Filled 2021-09-20: qty 1

## 2021-09-20 NOTE — ED Triage Notes (Signed)
Pt returns with abdominal pain, states it hasnt gone away. March had diverticulits and had surgery. Gets cdiff often,wondering if its cdiff. Abd pain across whole abdomen,stabbing. Nausea.

## 2021-09-20 NOTE — ED Provider Notes (Signed)
[facility] DEPT Provider Note   CSN: [id number] Arrival date & time: 09/20/21  1550     History  Chief Complaint  Patient presents with   Abdominal Pain    [name] [name] is a 76 y.o. female.  Patient here with abdominal bloating.  History of diverticulitis, bipolar.  Nothing makes it worse or better.  Denies any fevers or chills.  Denies any constipation or diarrhea.  Nothing makes it worse or better.  No chest pain or shortness of breath.  Has felt nauseous at time.  Denies any major abdominal surgery.  The history is provided by the patient.       Home Medications Prior to Admission medications   Medication Sig Start Date End Date Taking? Authorizing Provider  ondansetron (ZOFRAN) 4 MG tablet Take 1 tablet (4 mg total) by mouth every 6 (six) hours. 09/20/21  Yes Linlee Cromie, DO  acetaminophen (TYLENOL) 325 MG tablet Take 2 tablets (650 mg total) by mouth every 6 (six) hours as needed. Patient taking differently: Take 650 mg by mouth every 6 (six) hours as needed for mild pain. 02/11/21   Jeanell Sparrow, DO  ALPRAZolam Duanne Moron) 0.5 MG tablet Take 1 tablet (0.5 mg total) by mouth 4 (four) times daily as needed for anxiety. 07/28/21   [name], PMHNP  atorvastatin (LIPITOR) 40 MG tablet Take 40 mg by mouth every evening.    [provider]  b complex vitamins capsule Take 1 capsule by mouth daily.    [provider]  Baclofen 5 MG TABS Take 5 mg by mouth 3 (three) times daily as needed. 07/23/21   Concepcion Living, MD  BD PEN NEEDLE NANO 2ND GEN 32G X 4 MM MISC USE TO INJECT 5 TIMES DAILY AS DIRECTED 04/28/21   Elayne Snare, MD  cholecalciferol (VITAMIN D3) 25 MCG (1000 UNIT) tablet Take 1,000 Units by mouth daily. Also takes 2,000 units capsule at bedtime    [provider]  Cholecalciferol (VITAMIN D3) 50 MCG (2000 UT) TABS Take 2,000 Units by mouth at bedtime. Also takes 1,000 units capsule in the morning    [provider]  CONTOUR NEXT TEST test strip USE TO TEST BLOOD SUGAR TWICE DAILY 08/01/21   Elayne Snare, MD  dicyclomine (BENTYL) 20 MG tablet Take 1 tablet (20 mg total) by mouth 3 (three) times daily as needed for spasms. 06/17/21   Shelly Coss, MD  divalproex (DEPAKOTE) 250 MG DR tablet Take 2 tablets (500 mg total) by mouth at bedtime. 07/03/21   [name], PMHNP  donepezil (ARICEPT) 10 MG tablet Take 1 tablet (10 mg total) by mouth at bedtime. 07/03/21   [name], PMHNP  furosemide (LASIX) 20 MG tablet Take 1 tablet (20 mg total) by mouth daily for 10 days. 07/06/21 08/07/21  Regan Lemming, MD  gabapentin (NEURONTIN) 600 MG tablet Take 600 mg by mouth 3 (three) times daily. 05/12/19   [provider]  insulin lispro (HUMALOG KWIKPEN) 100 UNIT/ML KwikPen ADMINISTER 10 UNITS UNDER THE SKIN BEFORE MEALS Patient taking differently: Inject 10 Units into the skin 3 (three) times daily as needed (high blood sugar). 5-10 units 3 times a day 05/30/21   Elayne Snare, MD  LANTUS SOLOSTAR 100 UNIT/ML Solostar Pen ADMINISTER 64 UNITS UNDER THE SKIN AT BEDTIME Patient taking differently: ADMINISTER 66 UNITS UNDER THE SKIN AT BEDTIME 06/18/21   Elayne Snare, MD  levothyroxine (SYNTHROID) 75 MCG tablet TAKE 1 TABLET BY MOUTH EVERY DAY  BEFORE BREAKFAST Patient taking differently: Take 75 mcg by mouth daily before breakfast. 03/27/21   Elayne Snare, MD  liothyronine (CYTOMEL) 5 MCG tablet TAKE 1 TABLET BY MOUTH DAILY WITH LEVOTHYROXINE. TAKE BEFORE BREAKFAST Patient taking differently: Take 5 mcg by mouth daily. 03/27/21   Elayne Snare, MD  lithium carbonate (ESKALITH) 450 MG CR tablet Take 1 tablet (450 mg total) by mouth at bedtime. 07/03/21   [name], PMHNP  losartan (COZAAR) 25 MG tablet Take 25 mg by mouth every evening. 08/31/19   [provider]  memantine (NAMENDA) 10 MG tablet TAKE 1 TABLET(10 MG) BY MOUTH EVERY EVENING 07/03/21   [name], PMHNP  Microlet Lancets MISC  TEST DAILY AS DIRECTED 08/01/21   Elayne Snare, MD  nitroGLYCERIN (NITROSTAT) 0.4 MG SL tablet Place 1 tablet (0.4 mg total) under the tongue every 5 (five) minutes as needed for chest pain. 09/14/17   Lyda Jester M, PA-C  OLANZapine (ZYPREXA) 2.5 MG tablet Take 1 tablet (2.5 mg total) by mouth at bedtime. 08/29/21   [name], PMHNP  oxyCODONE (ROXICODONE) 5 MG immediate release tablet Take 1 tablet (5 mg total) by mouth every 6 (six) hours as needed for up to 10 doses for breakthrough pain. 07/04/21   Megin Consalvo, DO  oxyCODONE-acetaminophen (PERCOCET/ROXICET) 5-325 MG tablet Take 1 tablet by mouth every 6 (six) hours as needed for severe pain. 06/17/21   Shelly Coss, MD  QUEtiapine (SEROQUEL) 200 MG tablet TAKE 1 TAB PO q 5 pm and 1 tab po QHS 07/03/21   [name], PMHNP  rOPINIRole (REQUIP) 0.5 MG tablet Takes 4 tabs at 5 pm and 3 tabs at bedtime and 1-2 tabs prn 07/03/21   [name], PMHNP  tiZANidine (ZANAFLEX) 4 MG tablet Take 1 tablet (4 mg total) by mouth every 6 (six) hours as needed for muscle spasms. 77/41/28   Delora Fuel, MD  triamcinolone (NASACORT) 55 MCG/ACT AERO nasal inhaler Place 2 sprays into the nose daily as needed (allergies).    [provider]  vancomycin (VANCOCIN) 125 MG capsule Take 125 mg by mouth daily. 08/05/21   [provider]      Allergies    Codeine, Procaine hcl, Epinephrine, Aspirin, Augmentin [amoxicillin-pot clavulanate], Benadryl [diphenhydramine], Diflucan [fluconazole], Dilaudid [hydromorphone hcl], Hydromorphone, Morphine, Other, Prednisone, Tramadol hcl, and Sulfa antibiotics    Review of Systems   Review of Systems  Physical Exam Updated Vital Signs BP (!) 155/77 (BP Location: Right Arm)   Pulse 89   Temp 98 F (36.7 C) (Oral)   Resp 16   SpO2 100%  Physical Exam Vitals and nursing note reviewed.  Constitutional:      General: She is not in acute distress.    Appearance: She is well-developed.   HENT:     Head: Normocephalic and atraumatic.  Eyes:     Conjunctiva/sclera: Conjunctivae normal.  Cardiovascular:     Rate and Rhythm: Normal rate and regular rhythm.     Heart sounds: No murmur heard. Pulmonary:     Effort: Pulmonary effort is normal. No respiratory distress.     Breath sounds: Normal breath sounds.  Abdominal:     Palpations: Abdomen is soft.     Tenderness: There is generalized abdominal tenderness.  Musculoskeletal:        General: No swelling.     Cervical back: Neck supple.  Skin:    General: Skin is warm and dry.     Capillary Refill: Capillary refill takes less than  2 seconds.  Neurological:     Mental Status: She is alert.  Psychiatric:        Mood and Affect: Mood normal.     ED Results / Procedures / Treatments   Labs (all labs ordered are listed, but only abnormal results are displayed) Labs Reviewed  COMPREHENSIVE METABOLIC PANEL - Abnormal; Notable for the following components:      Result Value   CO2 20 (*)    Glucose, Bld 145 (*)    Calcium 10.8 (*)    All other components within normal limits  CBC WITH DIFFERENTIAL/PLATELET  LIPASE, BLOOD  URINALYSIS, ROUTINE W REFLEX MICROSCOPIC    EKG None  Radiology CT ABDOMEN PELVIS WO CONTRAST  Result Date: 09/20/2021 CLINICAL DATA:  Abdominal pain. EXAM: CT ABDOMEN AND PELVIS WITHOUT CONTRAST TECHNIQUE: Multidetector CT imaging of the abdomen and pelvis was performed following the standard protocol without IV contrast. RADIATION DOSE REDUCTION: This exam was performed according to the departmental dose-optimization program which includes automated exposure control, adjustment of the mA and/or kV according to patient size and/or use of iterative reconstruction technique. COMPARISON:  CT abdomen pelvis dated 09/10/2021. FINDINGS: Evaluation of this exam is limited in the absence of intravenous contrast. Lower chest: There is a 6 mm left lower lobe nodule (coronal 16/6) present dating back to  11/19/2016. The visualized lung bases are otherwise clear. No intra-abdominal free air or free fluid. Hepatobiliary: No focal liver abnormality is seen. No gallstones, gallbladder wall thickening, or biliary dilatation. Pancreas: Unremarkable. No pancreatic ductal dilatation or surrounding inflammatory changes. Spleen: Normal in size without focal abnormality. Adrenals/Urinary Tract: The adrenal glands are unremarkable. Moderate right renal parenchyma atrophy and cortical scarring. Punctate nonobstructing calculi in the upper pole of the right kidney and interpolar left kidney. There is some mild chronic right hydronephrosis with transition at the right ureteropelvic junction. No obstructing stone noted. Underlying stricture or urothelial lesion is not excluded. No hydronephrosis on the left. Several small left parapelvic cysts. The left ureter appear unremarkable. The urinary bladder is minimally distended and grossly unremarkable. Stomach/Bowel: Small hiatal hernia. There is moderate stool throughout the colon. Several scattered colonic diverticula without active inflammatory changes. There is postsurgical changes of sigmoid resection with anastomotic suture. There is no bowel obstruction or active inflammation. Appendectomy. Vascular/Lymphatic: Mild aortoiliac atherosclerotic disease. The IVC is unremarkable no portal venous gas. There is no adenopathy. Reproductive: Hysterectomy.  No adnexal masses. Other: Small fat containing umbilical hernia. Musculoskeletal: Total right hip arthroplasty. No acute osseous pathology. Degenerative changes of the spine. IMPRESSION: 1. No acute intra-abdominal or pelvic pathology. 2. Colonic diverticulosis.  No bowel obstruction. 3. Punctate nonobstructing bilateral renal calculi. Mild chronic right hydronephrosis likely related to UPJ stricture. 4. Aortic Atherosclerosis (ICD10-I70.0). Electronically Signed   By: Anner Crete M.D.   On: 09/20/2021 19:24     Procedures Procedures    Medications Ordered in ED Medications  ondansetron (ZOFRAN-ODT) disintegrating tablet 4 mg (has no administration in time range)    ED Course/ Medical Decision Making/ A&P                           Medical Decision Making Amount and/or Complexity of Data Reviewed Labs: ordered. Radiology: ordered.  Risk Prescription drug management.   [name] is here with abdominal pain.  History of diverticulitis.  Differential diagnosis diverticulitis versus constipation versus bowel obstruction versus UTI.  Normal vitals.  No fever.  CBC, CMP, lipase,  urinalysis, CT scan abdomen and pelvis performed.  Per my review and interpretation lab work is unremarkable.  CT scan with no acute findings.  No UTI.  Overall suspect gas related pain.  No emergent processes at this time.  Will prescribe Zofran.  Discharged in good condition.  Recommend follow-up primary care doctor.  This chart was dictated using voice recognition software.  Despite best efforts to proofread,  errors can occur which can change the documentation meaning.         Final Clinical Impression(s) / ED Diagnoses Final diagnoses:  Generalized abdominal pain    Rx / DC Orders ED Discharge Orders          Ordered    ondansetron (ZOFRAN) 4 MG tablet  Every 6 hours        09/20/21 1955              Lennice Sites, DO 09/20/21 1957

## 2021-09-23 DIAGNOSIS — R109 Unspecified abdominal pain: Secondary | ICD-10-CM | POA: Diagnosis not present

## 2021-09-23 DIAGNOSIS — R197 Diarrhea, unspecified: Secondary | ICD-10-CM | POA: Diagnosis not present

## 2021-09-23 DIAGNOSIS — Z8719 Personal history of other diseases of the digestive system: Secondary | ICD-10-CM | POA: Diagnosis not present

## 2021-09-24 ENCOUNTER — Ambulatory Visit (HOSPITAL_BASED_OUTPATIENT_CLINIC_OR_DEPARTMENT_OTHER): Payer: Medicare Other | Admitting: Physical Therapy

## 2021-09-26 ENCOUNTER — Ambulatory Visit (HOSPITAL_BASED_OUTPATIENT_CLINIC_OR_DEPARTMENT_OTHER): Payer: Self-pay | Admitting: Physical Therapy

## 2021-09-26 DIAGNOSIS — E785 Hyperlipidemia, unspecified: Secondary | ICD-10-CM | POA: Diagnosis not present

## 2021-09-26 DIAGNOSIS — K219 Gastro-esophageal reflux disease without esophagitis: Secondary | ICD-10-CM | POA: Diagnosis not present

## 2021-09-26 DIAGNOSIS — E1165 Type 2 diabetes mellitus with hyperglycemia: Secondary | ICD-10-CM | POA: Diagnosis not present

## 2021-09-26 DIAGNOSIS — I10 Essential (primary) hypertension: Secondary | ICD-10-CM | POA: Diagnosis not present

## 2021-09-26 DIAGNOSIS — E039 Hypothyroidism, unspecified: Secondary | ICD-10-CM | POA: Diagnosis not present

## 2021-10-02 ENCOUNTER — Ambulatory Visit (INDEPENDENT_AMBULATORY_CARE_PROVIDER_SITE_OTHER): Payer: Medicare Other | Admitting: Psychiatry

## 2021-10-02 ENCOUNTER — Encounter: Payer: Self-pay | Admitting: Psychiatry

## 2021-10-02 DIAGNOSIS — F5101 Primary insomnia: Secondary | ICD-10-CM

## 2021-10-02 DIAGNOSIS — F319 Bipolar disorder, unspecified: Secondary | ICD-10-CM

## 2021-10-02 DIAGNOSIS — Z79899 Other long term (current) drug therapy: Secondary | ICD-10-CM

## 2021-10-02 DIAGNOSIS — G2581 Restless legs syndrome: Secondary | ICD-10-CM

## 2021-10-02 MED ORDER — QUETIAPINE FUMARATE 200 MG PO TABS: ORAL_TABLET | ORAL | 0 refills | Status: DC

## 2021-10-02 MED ORDER — DONEPEZIL HCL 10 MG PO TABS: 10.0000 mg | ORAL_TABLET | Freq: Every day | ORAL | 0 refills | Status: DC

## 2021-10-02 MED ORDER — MEMANTINE HCL 10 MG PO TABS: ORAL_TABLET | ORAL | 0 refills | Status: DC

## 2021-10-02 MED ORDER — ROPINIROLE HCL 0.5 MG PO TABS: ORAL_TABLET | ORAL | 0 refills | Status: DC
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
## 2021-10-03 ENCOUNTER — Other Ambulatory Visit: Payer: Medicare Other

## 2021-10-03 ENCOUNTER — Telehealth: Payer: Self-pay | Admitting: Psychiatry

## 2021-10-03 DIAGNOSIS — F319 Bipolar disorder, unspecified: Secondary | ICD-10-CM

## 2021-10-03 LAB — LITHIUM LEVEL: Lithium Lvl: 0.5 mmol/L — ABNORMAL LOW (ref 0.6–1.2)

## 2021-10-03 LAB — VALPROIC ACID LEVEL: Valproic Acid Lvl: 43.1 mg/L — ABNORMAL LOW (ref 50.0–100.0)

## 2021-10-03 MED ORDER — DIVALPROEX SODIUM ER 250 MG PO TB24: ORAL_TABLET | ORAL | 0 refills | Status: DC

## 2021-10-03 NOTE — Telephone Encounter (Signed)
Called pt to discuss lab results. Discussed that VPA was slightly low. Discussed temporarily increasing dose to stabilize manic/mixed symptoms. Also discussed that she has been prescribed both divalproex DR and Depakote ER and more recently has been taking DR formulation. Discussed that ER can possibly have less risk of diarrhea. Will switch Depakote DR to Depakote ER and increase to 250 mg in the morning and 500 mg at bedtime. Discussed dividing doses since she has complained of feeling "drunk" in the past with taking 750 mg at bedtime. She reports that she will wait until Monday to start med change since she has visitors coming this weekend. Patient advised to contact office with any questions, adverse effects, or acute worsening in signs and symptoms.

## 2021-10-06 ENCOUNTER — Telehealth: Payer: Self-pay | Admitting: Psychiatry

## 2021-10-06 ENCOUNTER — Other Ambulatory Visit (INDEPENDENT_AMBULATORY_CARE_PROVIDER_SITE_OTHER): Payer: Medicare Other

## 2021-10-06 DIAGNOSIS — E039 Hypothyroidism, unspecified: Secondary | ICD-10-CM | POA: Diagnosis not present

## 2021-10-06 DIAGNOSIS — M549 Dorsalgia, unspecified: Secondary | ICD-10-CM | POA: Diagnosis not present

## 2021-10-06 DIAGNOSIS — M542 Cervicalgia: Secondary | ICD-10-CM | POA: Diagnosis not present

## 2021-10-06 DIAGNOSIS — Z794 Long term (current) use of insulin: Secondary | ICD-10-CM | POA: Diagnosis not present

## 2021-10-06 DIAGNOSIS — E1165 Type 2 diabetes mellitus with hyperglycemia: Secondary | ICD-10-CM | POA: Diagnosis not present

## 2021-10-06 LAB — LIPID PANEL
Cholesterol: 257 mg/dL — ABNORMAL HIGH (ref 0–200)
HDL: 56.7 mg/dL (ref >39.00)
Total CHOL/HDL Ratio: 5
Triglycerides: 406 mg/dL — ABNORMAL HIGH (ref 0.0–149.0)

## 2021-10-06 LAB — LDL CHOLESTEROL, DIRECT: Direct LDL: 148 mg/dL

## 2021-10-06 LAB — COMPREHENSIVE METABOLIC PANEL
ALT: 20 U/L (ref 0–35)
AST: 17 U/L (ref 0–37)
Albumin: 4.1 g/dL (ref 3.5–5.2)
Alkaline Phosphatase: 89 U/L (ref 39–117)
BUN: 12 mg/dL (ref 6–23)
CO2: 27 mEq/L (ref 19–32)
Calcium: 10.4 mg/dL (ref 8.4–10.5)
Chloride: 105 mEq/L (ref 96–112)
Creatinine, Ser: 0.9 mg/dL (ref 0.40–1.20)
GFR: 63.33 mL/min (ref >60.00)
Glucose, Bld: 118 mg/dL — ABNORMAL HIGH (ref 70–99)
Potassium: 4.9 mEq/L (ref 3.5–5.1)
Sodium: 140 mEq/L (ref 135–145)
Total Bilirubin: 0.3 mg/dL (ref 0.2–1.2)
Total Protein: 7 g/dL (ref 6.0–8.3)

## 2021-10-06 LAB — T4, FREE: Free T4: 0.76 ng/dL (ref 0.60–1.60)

## 2021-10-06 LAB — T3, FREE: T3, Free: 2.8 pg/mL (ref 2.3–4.2)

## 2021-10-06 LAB — HEMOGLOBIN A1C: Hgb A1c MFr Bld: 6.8 % — ABNORMAL HIGH (ref 4.6–6.5)

## 2021-10-06 LAB — TSH: TSH: 0.67 u[IU]/mL (ref 0.35–5.50)

## 2021-10-06 NOTE — Telephone Encounter (Signed)
Please let pharmacy know that we are switching from Divalproex DR to Depakote ER since this may have less risk of exacerbating diarrhea. We are increasing dose due to recent manic/mixed episodes and dividing doses to help with tolerability.

## 2021-10-06 NOTE — Telephone Encounter (Signed)
Pharmacy notified.

## 2021-10-06 NOTE — Telephone Encounter (Signed)
Please see message with regards to Depakote dose. Please advise.

## 2021-10-06 NOTE — Telephone Encounter (Signed)
Pharmacy called and said that they need clarification on the depakote script that was sent in. Pt usually takes the regular depakote and not the extended release. So they want to know if that was in error or did she change dose. Please call them when you have an ansere

## 2021-10-07 ENCOUNTER — Ambulatory Visit (INDEPENDENT_AMBULATORY_CARE_PROVIDER_SITE_OTHER): Payer: Medicare Other | Admitting: Endocrinology

## 2021-10-07 ENCOUNTER — Encounter: Payer: Self-pay | Admitting: Endocrinology

## 2021-10-07 ENCOUNTER — Ambulatory Visit (HOSPITAL_BASED_OUTPATIENT_CLINIC_OR_DEPARTMENT_OTHER): Payer: Self-pay | Admitting: Physical Therapy

## 2021-10-07 VITALS — BP 132/88 | HR 83 | Ht 61.0 in | Wt 183.0 lb

## 2021-10-07 DIAGNOSIS — E782 Mixed hyperlipidemia: Secondary | ICD-10-CM | POA: Diagnosis not present

## 2021-10-07 DIAGNOSIS — E039 Hypothyroidism, unspecified: Secondary | ICD-10-CM | POA: Diagnosis not present

## 2021-10-07 DIAGNOSIS — E119 Type 2 diabetes mellitus without complications: Secondary | ICD-10-CM | POA: Diagnosis not present

## 2021-10-07 DIAGNOSIS — Z794 Long term (current) use of insulin: Secondary | ICD-10-CM

## 2021-10-07 LAB — FRUCTOSAMINE: Fructosamine: 222 umol/L (ref 0–285)

## 2021-10-07 MED ORDER — EZETIMIBE 10 MG PO TABS: 10.0000 mg | ORAL_TABLET | Freq: Every day | ORAL | 3 refills | Status: DC

## 2021-10-07 MED ORDER — LANTUS SOLOSTAR 100 UNIT/ML ~~LOC~~ SOPN: PEN_INJECTOR | SUBCUTANEOUS | 2 refills | Status: DC

## 2021-10-07 NOTE — Progress Notes (Unsigned)
Patient ID: [name] [name], female   DOB: [birth year], 76 y.o.   MRN: [id number]            Reason for Appointment: Follow-up    History of Present Illness:          Date of diagnosis of type 2 diabetes mellitus: ?  2014        Background history:   She thinks her blood sugar was 400 at the time of diagnosis but no detailed records of this are available She did have an A1c of 10.6 done in 2014 and was probably given Lantus for some time initially Apparently she was treated with various medications including metformin, Janumet and Tradgenta. Her blood sugars had improved and A1c in 2015 was down to 6.2 She tends to have diarrhea with metformin and Janumet and also she thinks it causes dry mouth Most likely Janumet was stopped in 06/2015 Glipizide was started in 8/17 when blood sugars were higher and A1c was 9%  Recent history:   INSULIN regimen is:   Lantus 66 units daily at 5 pm,   Non-insulin hypoglycemic drugs the patient is taking are: None  Her A1c is improved at 6.7 compared to 7.7   Current management, blood sugar patterns and problems identified:  She has had overall better control with adding some Humalog although difficult to assess her blood sugars since she is mostly monitoring before meals and use regularly  She was supposed to take Humalog with every meal based on meal size but now she says that she is only taking it if she has checked her sugar and will take 5 to 10 units based on her blood sugar level  Last evening he had at least 2 starches at dinnertime and did not take any coverage Frequently eating desserts although she thinks these are low-fat  Highest blood sugar was after lunch from eating dessert  Her morning sugars fluctuate significantly and may be as low as 117 but occasionally higher and generally higher in the last few days  Not taking Ozempic because of reportedly having gastroparesis  She is not able to do much exercise Her weight is about the  same, previously had lost some after illness and surgery She was told to try and get the freestyle libre but was not able to get the paperwork completed or had some delay   Side effects from medications have been:?  Diarrhea from metformin and Janumet, Jardiance   PRE-MEAL Fasting Lunch Dinner Bedtime Overall  Glucose range: 117-211    86-227  Mean/median: 162  143  143   POST-MEAL PC Breakfast PC Lunch PC Dinner  Glucose range:   ?  Mean/median:        Typical meal intake: Breakfast at 9,  May be eggs and sausage and lunch is usually a sandwich   Dinner 7 pm               Dietician visit, most recent: 10/18                Weight history:   Wt Readings from Last 3 Encounters:  10/07/21 183 lb (83 kg)  09/10/21 180 lb (81.6 kg)  08/07/21 179 lb 9.6 oz (81.5 kg)    Glycemic control:   Lab Results  Component Value Date   HGBA1C 6.8 (H) 10/06/2021   HGBA1C 6.7 (A) 07/16/2021   HGBA1C 7.7 (H) 03/25/2021   Lab Results  Component Value Date   MICROALBUR <0.7 08/09/2020  LDLCALC 79 08/09/2020   CREATININE 0.90 10/06/2021   Lab Results  Component Value Date   MICRALBCREAT 1.1 08/09/2020       Allergies as of 10/07/2021       Reactions   Codeine Anxiety, Other (See Comments)   Hallucinations, tolerates oxycodone    Procaine Hcl Palpitations   Epinephrine    Other reaction(s): Other (See Comments) Makes heart race at the dentist Makes heart race at the dentist   Aspirin Nausea And Vomiting, Other (See Comments)   Reaction:  Burns pts stomach    Augmentin [amoxicillin-pot Clavulanate] Diarrhea   Benadryl [diphenhydramine] Other (See Comments)   Per MD "inhibits potency of gabapentin, lithium etc"   Diflucan [fluconazole] Other (See Comments)   Unknown reaction   Dilaudid [hydromorphone Hcl] Other (See Comments)   Migraines and nightmares    Hydromorphone Other (See Comments)   Morphine Other (See Comments)   headache   Other Nausea And Vomiting, Other  (See Comments)   Pt states that all -mycins cause N/V. (MACROLIDES)    Prednisone    Long term steroid problems with lithium and blood sugar.    Tramadol Hcl    Other reaction(s): felt weird   Sulfa Antibiotics Other (See Comments)   Unknown reaction        Medication List        Accurate as of October 07, 2021 11:23 AM. If you have any questions, ask your nurse or doctor.          STOP taking these medications    Baclofen 5 MG Tabs Stopped by: Elayne Snare, MD   furosemide 20 MG tablet Commonly known as: LASIX Stopped by: Elayne Snare, MD       TAKE these medications    acetaminophen 325 MG tablet Commonly known as: Tylenol Take 2 tablets (650 mg total) by mouth every 6 (six) hours as needed. What changed: reasons to take this   ALPRAZolam 0.5 MG tablet Commonly known as: XANAX Take 1 tablet (0.5 mg total) by mouth 4 (four) times daily as needed for anxiety.   atorvastatin 40 MG tablet Commonly known as: LIPITOR Take 40 mg by mouth every evening.   b complex vitamins capsule Take 1 capsule by mouth daily.   BD Pen Needle Nano 2nd Gen 32G X 4 MM Misc Generic drug: Insulin Pen Needle USE TO INJECT 5 TIMES DAILY AS DIRECTED   bismuth subsalicylate 144 RX/54MG suspension Commonly known as: PEPTO BISMOL Take 30 mLs by mouth every 6 (six) hours as needed.   cholecalciferol 25 MCG (1000 UNIT) tablet Commonly known as: VITAMIN D3 Take 1,000 Units by mouth daily. Also takes 2,000 units capsule at bedtime   Vitamin D3 50 MCG (2000 UT) Tabs Take 2,000 Units by mouth at bedtime. Also takes 1,000 units capsule in the morning   Contour Next Test test strip Generic drug: glucose blood USE TO TEST BLOOD SUGAR TWICE DAILY   dicyclomine 20 MG tablet Commonly known as: BENTYL Take 1 tablet (20 mg total) by mouth 3 (three) times daily as needed for spasms.   divalproex 250 MG 24 hr tablet Commonly known as: DEPAKOTE ER Take 1 tablet in the morning and 2 tablets at  bedtime   donepezil 10 MG tablet Commonly known as: ARICEPT Take 1 tablet (10 mg total) by mouth at bedtime.   fluticasone 50 MCG/ACT nasal spray Commonly known as: FLONASE Place into both nostrils as needed for allergies or rhinitis.   gabapentin 600  MG tablet Commonly known as: NEURONTIN Take 600 mg by mouth 3 (three) times daily.   insulin lispro 100 UNIT/ML KwikPen Commonly known as: HumaLOG KwikPen ADMINISTER 10 UNITS UNDER THE SKIN BEFORE MEALS What changed:  how much to take how to take this when to take this reasons to take this additional instructions   Lantus SoloStar 100 UNIT/ML Solostar Pen Generic drug: insulin glargine ADMINISTER 64 UNITS UNDER THE SKIN AT BEDTIME What changed: See the new instructions.   levothyroxine 75 MCG tablet Commonly known as: SYNTHROID TAKE 1 TABLET BY MOUTH EVERY DAY BEFORE BREAKFAST What changed: See the new instructions.   liothyronine 5 MCG tablet Commonly known as: CYTOMEL TAKE 1 TABLET BY MOUTH DAILY WITH LEVOTHYROXINE. TAKE BEFORE BREAKFAST What changed: See the new instructions.   lithium carbonate 450 MG CR tablet Commonly known as: ESKALITH Take 1 tablet (450 mg total) by mouth at bedtime.   loratadine 10 MG tablet Commonly known as: CLARITIN Take 10 mg by mouth daily as needed for allergies.   losartan 25 MG tablet Commonly known as: COZAAR Take 25 mg by mouth every evening.   memantine 10 MG tablet Commonly known as: NAMENDA TAKE 1 TABLET(10 MG) BY MOUTH EVERY EVENING   Microlet Lancets Misc TEST DAILY AS DIRECTED   nitroGLYCERIN 0.4 MG SL tablet Commonly known as: NITROSTAT Place 1 tablet (0.4 mg total) under the tongue every 5 (five) minutes as needed for chest pain.   ondansetron 4 MG tablet Commonly known as: ZOFRAN Take 1 tablet (4 mg total) by mouth every 6 (six) hours.   oxyCODONE 5 MG immediate release tablet Commonly known as: Roxicodone Take 1 tablet (5 mg total) by mouth every 6 (six)  hours as needed for up to 10 doses for breakthrough pain.   oxyCODONE-acetaminophen 5-325 MG tablet Commonly known as: PERCOCET/ROXICET Take 1 tablet by mouth every 6 (six) hours as needed for severe pain.   QUEtiapine 200 MG tablet Commonly known as: SEROQUEL TAKE 1 TAB PO q 5 pm and 1 tab po QHS   rifaximin 200 MG tablet Commonly known as: XIFAXAN Take 200 mg by mouth 3 (three) times daily.   rOPINIRole 0.5 MG tablet Commonly known as: REQUIP Takes 4 tabs at 5 pm and 3 tabs at bedtime and 1-2 tabs prn   tiZANidine 4 MG tablet Commonly known as: Zanaflex Take 1 tablet (4 mg total) by mouth every 6 (six) hours as needed for muscle spasms.   vancomycin 125 MG capsule Commonly known as: VANCOCIN Take 125 mg by mouth daily.        Allergies:  Allergies  Allergen Reactions   Codeine Anxiety and Other (See Comments)    Hallucinations, tolerates oxycodone    Procaine Hcl Palpitations   Epinephrine     Other reaction(s): Other (See Comments) Makes heart race at the dentist Makes heart race at the dentist    Aspirin Nausea And Vomiting and Other (See Comments)    Reaction:  Burns pts stomach    Augmentin [Amoxicillin-Pot Clavulanate] Diarrhea   Benadryl [Diphenhydramine] Other (See Comments)    Per MD "inhibits potency of gabapentin, lithium etc"   Diflucan [Fluconazole] Other (See Comments)    Unknown reaction    Dilaudid [Hydromorphone Hcl] Other (See Comments)    Migraines and nightmares    Hydromorphone Other (See Comments)   Morphine Other (See Comments)    headache   Other Nausea And Vomiting and Other (See Comments)    Pt states that  all -mycins cause N/V. (MACROLIDES)    Prednisone     Long term steroid problems with lithium and blood sugar.    Tramadol Hcl     Other reaction(s): felt weird   Sulfa Antibiotics Other (See Comments)    Unknown reaction    Past Medical History:  Diagnosis Date   Alcohol abuse    Anxiety    takes Valium daily as needed  and Ativan daily   Bilateral hearing loss    Bipolar 1 disorder ([place])    takes Lithium nightly and Synthroid daily   Chronic back pain    DDD; "all over" (09/14/2017)   Colitis, ischemic ([place]) 2012   Confusion    r/t meds   Depression    takes Prozac daily and Bupspirone    Diverticulosis    Dyslipidemia    takes Crestor daily   Fibromyalgia    Gastroparesis    Headache    "weekly" (09/14/2017)   Hepatic steatosis 06/18/12   severe   Hyperlipidemia    Hypertension    Hypothyroidism    IBS (irritable bowel syndrome)    Ischemic colitis ([place])    Joint pain    Joint swelling    Lupus erythematosus tumidus    tumid-skin   Migraine    "1-2/yr; maybe" (09/14/2017)   Numbness    in right foot   Osteoarthritis    "all over" (09/14/2017)   Osteoarthritis cervical spine    Osteoarthritis of hand    bilateral   Pneumonia    "walking pneumonia several times; long time since the last time" (09/14/2017)   Restless leg syndrome    takes Requip nightly   Sciatica    Type II diabetes mellitus ([place])    Urinary frequency    Urinary leakage    Urinary urgency    Urinary, incontinence, stress female    Walking pneumonia    last time more than 54yr ago    Past Surgical History:  Procedure Laterality Date   ABDOMINAL HYSTERECTOMY     "they left my ovaries"   APPENDECTOMY     BALLOON DILATION N/A 06/14/2020   Procedure: BALLOON DILATION;  Surgeon: KRonnette Juniper MD;  Location: [facility];  Service: Gastroenterology;  Laterality: N/A;   BIOPSY  06/14/2020   Procedure: BIOPSY;  Surgeon: KRonnette Juniper MD;  Location: [facility];  Service: Gastroenterology;;   CARDIAC CATHETERIZATION  09/14/2017   COLON RESECTION  05/2021   COLONOSCOPY     DENTAL SURGERY Left 10/2016   dental implant   DILATION AND CURETTAGE OF UTERUS  X 4   ESOPHAGOGASTRODUODENOSCOPY     ESOPHAGOGASTRODUODENOSCOPY (EGD) WITH PROPOFOL N/A 06/14/2020   Procedure: ESOPHAGOGASTRODUODENOSCOPY (EGD) WITH PROPOFOL;  Surgeon:  KRonnette Juniper MD;  Location: [facility];  Service: Gastroenterology;  Laterality: N/A;   FLEXIBLE SIGMOIDOSCOPY N/A 06/21/2012   Procedure: FLEXIBLE SIGMOIDOSCOPY;  Surgeon: JJerene Bears MD;  Location: [facility];  Service: Gastroenterology;  Laterality: N/A;   JOINT REPLACEMENT     LEFT HEART CATH AND CORONARY ANGIOGRAPHY N/A 09/14/2017   Procedure: LEFT HEART CATH AND CORONARY ANGIOGRAPHY;  Surgeon: SBelva Crome MD;  [facility];  Service: Cardiovascular;  Laterality: N/A;   SHOULDER ARTHROSCOPY Right    "shaved spurs off rotator cuff"   TONSILLECTOMY     TOTAL HIP ARTHROPLASTY Right 06/09/2013   Procedure: TOTAL HIP ARTHROPLASTY;  Surgeon: FKerin Salen MD;  [facility]  Service: Orthopedics;  Laterality: Right;   TOTAL  KNEE ARTHROPLASTY Left 02/07/2019   Procedure: TOTAL KNEE ARTHROPLASTY;  Surgeon: Paralee Cancel, MD;  Location: [facility];  Service: Orthopedics;  Laterality: Left;  70 mins   TUBAL LIGATION     TUMOR EXCISION Right 1968   angle of jaw; benign    Family History  Problem Relation Age of Onset   Drug abuse Mother    Alcohol abuse Mother    Alcohol abuse Father    Hypertension Father    CAD Brother    Hypertension Brother    Alcohol abuse Brother    Hypertension Brother    Hypertension Brother    Psoriasis Daughter    Arthritis Daughter        psoriatic arthritis    Alcohol abuse Grandchild     Social History:  reports that she has never smoked. She has never used smokeless tobacco. She reports that she does not currently use alcohol. She reports that she does not currently use drugs after having used the following drugs: Benzodiazepines. Frequency: 7.00 times per week.   Review of Systems    Lipid history: On Lipitor 40 mg with usually good control of LDL  Triglycerides last 333 done by PCP with LDL 81  Taking OTC fish oil    Lab Results  Component Value Date   CHOL 257 (H) 10/06/2021   CHOL 172 08/09/2020   CHOL 230 (H)  12/04/2019   Lab Results  Component Value Date   HDL 56.70 10/06/2021   HDL 56.90 08/09/2020   HDL 56.10 12/04/2019   Lab Results  Component Value Date   LDLCALC 79 08/09/2020   Lab Results  Component Value Date   TRIG (H) 10/06/2021    406.0 Triglyceride is over 400; calculations on Lipids are invalid.   TRIG 180.0 (H) 08/09/2020   TRIG 322.0 (H) 12/04/2019   Lab Results  Component Value Date   CHOLHDL 5 10/06/2021   CHOLHDL 3 08/09/2020   CHOLHDL 4 12/04/2019   Lab Results  Component Value Date   LDLDIRECT 148.0 10/06/2021   LDLDIRECT 137.0 12/04/2019   LDLDIRECT 116.0 08/28/2019            ?  Hypertension: Currently only on 25 mg losartan  BP Readings from Last 3 Encounters:  10/07/21 132/88  09/20/21 (!) 154/79  09/10/21 (!) 166/74     Most recent foot exam: 6/21  THYROID: She previously had been on levothyroxine and Cytomel from her psychiatrist for several years with uncertain diagnosis She is taking levothyroxine 75 g, Also on liothyronine 5 mcg, previously T3 level was low when this was stopped  Also on lithium  Her TSH is consistently normal Free T3 and free T4 last normal  Labs as follows:  Lab Results  Component Value Date   TSH 0.67 10/06/2021   TSH 0.925 07/06/2021   TSH 1.04 03/25/2021   FREET4 0.76 10/06/2021   FREET4 0.68 07/06/2021   FREET4 0.93 03/25/2021   Lab Results  Component Value Date   T3FREE 2.8 10/06/2021   T3FREE 3.2 03/25/2021   T3FREE 2.2 (L) 12/24/2020   T3FREE 3.2 08/28/2019   T3FREE 2.7 04/26/2019    She has atypical depression on multiple drugs, has been on Seroquel for quite some time  THYROID nodule:  She was found to have a nodule in her thyroid isthmus in 8/20 Ultrasound done in 1/21 showed multinodular goiter with only 1 significant nodule   However nodule in the isthmus was biopsied on the recommendation of her  PCP in 3/22 and that showed benign follicular nodule  She has had mild/variable  hypercalcemia, last levels:  Lab Results  Component Value Date   PTH 35 10/25/2018   CALCIUM 10.4 10/06/2021   CAION 1.30 07/14/2016   PHOS 2.9 12/26/2010   She reportedly has GASTROPARESIS and is going to see a dietitian Currently not on any specific treatment Taking medications as needed for nausea  She is on Namenda and Aricept for dementia  Physical Examination:  BP 132/88   Pulse 83   Ht '5\' 1"'$  (1.549 m)   Wt 183 lb (83 kg)   SpO2 (!) 87%   BMI 34.58 kg/m         ASSESSMENT:  Diabetes type 2, on insulin  See history of present illness for detailed discussion of current diabetes management, blood sugar patterns, monitor download and problems identified  She is on 66 units Lantus insulin and also on mealtime insulin, previously also on Ozempic 1 mg weekly  Her A1c is improved at 6.7 although she does have inconsistent home blood sugars  She is not taking her Humalog regularly as discussed above and diet can be improved also Currently not able to take GLP-1 drugs and also has intolerance to SGLT2 drugs   History of hyperlipidemia: Adequately replaced  HYPERTENSION: Blood pressure high normal and may be related to anxiety, continue to follow-up with PCP    PLAN:  She will need to get the freestyle libre and she will call to get the supplies and let us know when she has the meter and sensors available for training  In the meantime she can try and check more readings after dinner Discussed blood sugar targets before and after meals She will make sure she takes Humalog with every meal unless there is no carbohydrate Generally with suppertime she will need 10 units  She will reduce it to 5 units only if eating low carbohydrate meal When she has a sensor she will adjust her dose to keep her postprandial reading at least under 180   Consider consultation with dietitian  Continue same thyroid regimen including Cytomel   Follow-up in 3 months   There are no  Patient Instructions on file for this visit.   Total visit time including counseling time for diabetes management = 30 minutes       Elayne Snare 10/07/2021, 11:23 AM   Note: This office note was prepared with Dragon voice recognition system technology. Any transcriptional errors that result from this process are unintentional.

## 2021-10-15 ENCOUNTER — Other Ambulatory Visit: Payer: Self-pay | Admitting: Endocrinology

## 2021-10-16 ENCOUNTER — Encounter: Payer: Self-pay | Admitting: Endocrinology

## 2021-10-17 DIAGNOSIS — A0472 Enterocolitis due to Clostridium difficile, not specified as recurrent: Secondary | ICD-10-CM | POA: Diagnosis not present

## 2021-10-17 DIAGNOSIS — K635 Polyp of colon: Secondary | ICD-10-CM | POA: Diagnosis not present

## 2021-10-17 DIAGNOSIS — E119 Type 2 diabetes mellitus without complications: Secondary | ICD-10-CM | POA: Diagnosis not present

## 2021-10-17 DIAGNOSIS — Z882 Allergy status to sulfonamides status: Secondary | ICD-10-CM | POA: Diagnosis not present

## 2021-10-17 DIAGNOSIS — I10 Essential (primary) hypertension: Secondary | ICD-10-CM | POA: Diagnosis not present

## 2021-10-17 DIAGNOSIS — A0471 Enterocolitis due to Clostridium difficile, recurrent: Secondary | ICD-10-CM | POA: Diagnosis not present

## 2021-10-22 DIAGNOSIS — L0291 Cutaneous abscess, unspecified: Secondary | ICD-10-CM | POA: Diagnosis not present

## 2021-10-22 DIAGNOSIS — B009 Herpesviral infection, unspecified: Secondary | ICD-10-CM | POA: Diagnosis not present

## 2021-10-24 DIAGNOSIS — M5416 Radiculopathy, lumbar region: Secondary | ICD-10-CM | POA: Diagnosis not present

## 2021-10-24 DIAGNOSIS — M4312 Spondylolisthesis, cervical region: Secondary | ICD-10-CM | POA: Diagnosis not present

## 2021-10-27 ENCOUNTER — Telehealth: Payer: Self-pay | Admitting: Psychiatry

## 2021-10-27 NOTE — Telephone Encounter (Signed)
Yes, she can try taking all 3 at bedtime. Please ask how she is feeling and if manic/mixed symptoms have improved.

## 2021-10-27 NOTE — Telephone Encounter (Signed)
LVM to rtc 

## 2021-10-27 NOTE — Telephone Encounter (Signed)
Pt stated you added an extra Depakote to take in the morning.It is causing her to be very sleepy during the day.Can she take all 3 at night?

## 2021-10-27 NOTE — Telephone Encounter (Signed)
[name] [name] called this morning at 10:00. She reported that the Depakote is too strong.  It is just knocking her out.  Please call to discuss.  Appt 8/29

## 2021-10-28 NOTE — Telephone Encounter (Signed)
Pt stated her symptoms have improved

## 2021-10-29 ENCOUNTER — Telehealth: Payer: Self-pay

## 2021-10-29 NOTE — Telephone Encounter (Signed)
Patient notified and will make medication adjustment

## 2021-10-29 NOTE — Telephone Encounter (Signed)
Patient called states that her blood sugars have been high. She is currently taking 66 units of lantus and 10 units of humalog before each meal. I have printed out libre readings.

## 2021-10-31 ENCOUNTER — Encounter: Payer: Self-pay | Admitting: Internal Medicine

## 2021-10-31 DIAGNOSIS — M47812 Spondylosis without myelopathy or radiculopathy, cervical region: Secondary | ICD-10-CM | POA: Diagnosis not present

## 2021-11-01 DIAGNOSIS — M545 Low back pain, unspecified: Secondary | ICD-10-CM | POA: Diagnosis not present

## 2021-11-04 ENCOUNTER — Encounter: Payer: Self-pay | Admitting: Psychiatry

## 2021-11-04 ENCOUNTER — Ambulatory Visit (INDEPENDENT_AMBULATORY_CARE_PROVIDER_SITE_OTHER): Payer: Medicare Other | Admitting: Psychiatry

## 2021-11-04 DIAGNOSIS — F411 Generalized anxiety disorder: Secondary | ICD-10-CM

## 2021-11-04 DIAGNOSIS — F319 Bipolar disorder, unspecified: Secondary | ICD-10-CM

## 2021-11-04 DIAGNOSIS — F5101 Primary insomnia: Secondary | ICD-10-CM | POA: Diagnosis not present

## 2021-11-04 MED ORDER — ALPRAZOLAM 0.5 MG PO TABS
0.5000 mg | ORAL_TABLET | Freq: Four times a day (QID) | ORAL | 2 refills | Status: DC | PRN
Start: 2021-12-02 — End: 2022-01-21

## 2021-11-04 MED ORDER — DIVALPROEX SODIUM 250 MG PO DR TAB: 500.0000 mg | DELAYED_RELEASE_TABLET | Freq: Every day | ORAL | 0 refills | Status: DC

## 2021-11-04 MED ORDER — LITHIUM CARBONATE ER 450 MG PO TBCR: 450.0000 mg | EXTENDED_RELEASE_TABLET | Freq: Every day | ORAL | 0 refills | Status: DC
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
## 2021-11-06 ENCOUNTER — Encounter: Payer: Self-pay | Admitting: Endocrinology

## 2021-11-07 ENCOUNTER — Telehealth: Payer: Self-pay

## 2021-11-07 NOTE — Telephone Encounter (Signed)
I called and spoke with patient and gave her the information. She is still not happy states she wants to speak with her Dr. She will stay with her regimen she is currently doing with Humalog and increase Lantus to 70 units

## 2021-11-07 NOTE — Telephone Encounter (Signed)
Patient called in states that her blood sugar readings have been up and down on her [place]. Wants to know if you think more changes need to made to insulin regimen. She is connected on [employer].

## 2021-11-08 ENCOUNTER — Encounter (HOSPITAL_BASED_OUTPATIENT_CLINIC_OR_DEPARTMENT_OTHER): Payer: Self-pay | Admitting: Emergency Medicine

## 2021-11-08 ENCOUNTER — Inpatient Hospital Stay (HOSPITAL_BASED_OUTPATIENT_CLINIC_OR_DEPARTMENT_OTHER)
Admission: EM | Admit: 2021-11-08 | Discharge: 2021-11-12 | DRG: 690 | Disposition: A | Discharge status: Home / Self Care | Payer: Medicare Other | Attending: Family Medicine | Admitting: Family Medicine

## 2021-11-08 ENCOUNTER — Other Ambulatory Visit: Payer: Self-pay

## 2021-11-08 DIAGNOSIS — E119 Type 2 diabetes mellitus without complications: Secondary | ICD-10-CM

## 2021-11-08 DIAGNOSIS — M797 Fibromyalgia: Secondary | ICD-10-CM | POA: Diagnosis present

## 2021-11-08 DIAGNOSIS — Z888 Allergy status to other drugs, medicaments and biological substances status: Secondary | ICD-10-CM

## 2021-11-08 DIAGNOSIS — N136 Pyonephrosis: Principal | ICD-10-CM | POA: Diagnosis present

## 2021-11-08 DIAGNOSIS — M545 Low back pain, unspecified: Secondary | ICD-10-CM | POA: Diagnosis present

## 2021-11-08 DIAGNOSIS — H9193 Unspecified hearing loss, bilateral: Secondary | ICD-10-CM | POA: Diagnosis present

## 2021-11-08 DIAGNOSIS — E669 Obesity, unspecified: Secondary | ICD-10-CM | POA: Diagnosis present

## 2021-11-08 DIAGNOSIS — Z96641 Presence of right artificial hip joint: Secondary | ICD-10-CM | POA: Diagnosis present

## 2021-11-08 DIAGNOSIS — E039 Hypothyroidism, unspecified: Secondary | ICD-10-CM | POA: Diagnosis not present

## 2021-11-08 DIAGNOSIS — Z794 Long term (current) use of insulin: Secondary | ICD-10-CM

## 2021-11-08 DIAGNOSIS — Z9049 Acquired absence of other specified parts of digestive tract: Secondary | ICD-10-CM

## 2021-11-08 DIAGNOSIS — N10 Acute tubulo-interstitial nephritis: Secondary | ICD-10-CM | POA: Diagnosis not present

## 2021-11-08 DIAGNOSIS — Z6836 Body mass index (BMI) 36.0-36.9, adult: Secondary | ICD-10-CM | POA: Diagnosis not present

## 2021-11-08 DIAGNOSIS — F0393 Unspecified dementia, unspecified severity, with mood disturbance: Secondary | ICD-10-CM | POA: Diagnosis present

## 2021-11-08 DIAGNOSIS — J302 Other seasonal allergic rhinitis: Secondary | ICD-10-CM | POA: Diagnosis present

## 2021-11-08 DIAGNOSIS — K3184 Gastroparesis: Secondary | ICD-10-CM | POA: Diagnosis not present

## 2021-11-08 DIAGNOSIS — N1330 Unspecified hydronephrosis: Secondary | ICD-10-CM

## 2021-11-08 DIAGNOSIS — N131 Hydronephrosis with ureteral stricture, not elsewhere classified: Secondary | ICD-10-CM

## 2021-11-08 DIAGNOSIS — Z79890 Hormone replacement therapy (postmenopausal): Secondary | ICD-10-CM

## 2021-11-08 DIAGNOSIS — F419 Anxiety disorder, unspecified: Secondary | ICD-10-CM | POA: Diagnosis present

## 2021-11-08 DIAGNOSIS — Z885 Allergy status to narcotic agent status: Secondary | ICD-10-CM

## 2021-11-08 DIAGNOSIS — N139 Obstructive and reflux uropathy, unspecified: Secondary | ICD-10-CM

## 2021-11-08 DIAGNOSIS — G2581 Restless legs syndrome: Secondary | ICD-10-CM | POA: Diagnosis present

## 2021-11-08 DIAGNOSIS — E785 Hyperlipidemia, unspecified: Secondary | ICD-10-CM | POA: Diagnosis present

## 2021-11-08 DIAGNOSIS — Z79899 Other long term (current) drug therapy: Secondary | ICD-10-CM

## 2021-11-08 DIAGNOSIS — Z87440 Personal history of urinary (tract) infections: Secondary | ICD-10-CM

## 2021-11-08 DIAGNOSIS — A498 Other bacterial infections of unspecified site: Secondary | ICD-10-CM

## 2021-11-08 DIAGNOSIS — M479 Spondylosis, unspecified: Secondary | ICD-10-CM | POA: Diagnosis present

## 2021-11-08 DIAGNOSIS — M19041 Primary osteoarthritis, right hand: Secondary | ICD-10-CM | POA: Diagnosis present

## 2021-11-08 DIAGNOSIS — R109 Unspecified abdominal pain: Secondary | ICD-10-CM | POA: Diagnosis not present

## 2021-11-08 DIAGNOSIS — B9620 Unspecified Escherichia coli [E. coli] as the cause of diseases classified elsewhere: Secondary | ICD-10-CM | POA: Diagnosis not present

## 2021-11-08 DIAGNOSIS — Z9851 Tubal ligation status: Secondary | ICD-10-CM

## 2021-11-08 DIAGNOSIS — E1169 Type 2 diabetes mellitus with other specified complication: Secondary | ICD-10-CM

## 2021-11-08 DIAGNOSIS — G8929 Other chronic pain: Secondary | ICD-10-CM | POA: Diagnosis present

## 2021-11-08 DIAGNOSIS — F319 Bipolar disorder, unspecified: Secondary | ICD-10-CM | POA: Diagnosis present

## 2021-11-08 DIAGNOSIS — I10 Essential (primary) hypertension: Secondary | ICD-10-CM | POA: Diagnosis present

## 2021-11-08 DIAGNOSIS — M19042 Primary osteoarthritis, left hand: Secondary | ICD-10-CM | POA: Diagnosis present

## 2021-11-08 DIAGNOSIS — E1165 Type 2 diabetes mellitus with hyperglycemia: Secondary | ICD-10-CM | POA: Insufficient documentation

## 2021-11-08 DIAGNOSIS — E1143 Type 2 diabetes mellitus with diabetic autonomic (poly)neuropathy: Secondary | ICD-10-CM | POA: Diagnosis not present

## 2021-11-08 DIAGNOSIS — N12 Tubulo-interstitial nephritis, not specified as acute or chronic: Secondary | ICD-10-CM | POA: Diagnosis not present

## 2021-11-08 DIAGNOSIS — Z90710 Acquired absence of both cervix and uterus: Secondary | ICD-10-CM

## 2021-11-08 DIAGNOSIS — I700 Atherosclerosis of aorta: Secondary | ICD-10-CM | POA: Diagnosis not present

## 2021-11-08 DIAGNOSIS — Z96652 Presence of left artificial knee joint: Secondary | ICD-10-CM | POA: Diagnosis present

## 2021-11-08 DIAGNOSIS — F0390 Unspecified dementia without behavioral disturbance: Secondary | ICD-10-CM

## 2021-11-08 DIAGNOSIS — Z8249 Family history of ischemic heart disease and other diseases of the circulatory system: Secondary | ICD-10-CM

## 2021-11-08 DIAGNOSIS — Z882 Allergy status to sulfonamides status: Secondary | ICD-10-CM

## 2021-11-08 DIAGNOSIS — K589 Irritable bowel syndrome without diarrhea: Secondary | ICD-10-CM | POA: Diagnosis present

## 2021-11-08 DIAGNOSIS — Z886 Allergy status to analgesic agent status: Secondary | ICD-10-CM

## 2021-11-08 DIAGNOSIS — L930 Discoid lupus erythematosus: Secondary | ICD-10-CM | POA: Diagnosis present

## 2021-11-08 LAB — URINALYSIS, ROUTINE W REFLEX MICROSCOPIC
Bilirubin Urine: NEGATIVE
Glucose, UA: NEGATIVE mg/dL
Ketones, ur: NEGATIVE mg/dL
Nitrite: NEGATIVE
Protein, ur: 100 mg/dL — AB
RBC / HPF: >50 RBC/hpf — ABNORMAL HIGH (ref 0–5)
Specific Gravity, Urine: 1.014 (ref 1.005–1.030)
WBC, UA: >50 WBC/hpf — ABNORMAL HIGH (ref 0–5)
pH: 6.5 (ref 5.0–8.0)

## 2021-11-08 MED ORDER — ONDANSETRON HCL 4 MG/2ML IJ SOLN
4.0000 mg | Freq: Once | INTRAMUSCULAR | Status: AC
  Administered 2021-11-09: 4 mg via INTRAVENOUS
  Filled 2021-11-08: qty 2

## 2021-11-08 MED ORDER — SODIUM CHLORIDE 0.9 % IV BOLUS
1000.0000 mL | Freq: Once | INTRAVENOUS | Status: AC
  Administered 2021-11-09: 1000 mL via INTRAVENOUS

## 2021-11-08 MED ORDER — SODIUM CHLORIDE 0.9 % IV SOLN: INTRAVENOUS | Status: DC

## 2021-11-08 MED ORDER — FENTANYL CITRATE PF 50 MCG/ML IJ SOSY
50.0000 ug | PREFILLED_SYRINGE | Freq: Once | INTRAMUSCULAR | Status: AC
  Administered 2021-11-09: 50 ug via INTRAVENOUS
  Filled 2021-11-08: qty 1

## 2021-11-08 NOTE — ED Triage Notes (Signed)
"  I think I have a really bad UTI" Reports pain with urination, and pain in lower abdo. Started 3 days ago.

## 2021-11-08 NOTE — ED Provider Notes (Signed)
[facility] EMERGENCY DEPT Provider Note  CSN: [id number] Arrival date & time: 11/08/21 2303  Chief Complaint(s) Dysuria  HPI [name] [name] is a 76 y.o. female with extensive past medical history listed below including recent C. difficile colitis requiring fecal transplant, diverticulitis requiring sigmoidectomy here for lower/right lower quadrant abdominal discomfort with dysuria.   Dysuria Pain quality:  Aching Pain severity:  Moderate Onset quality:  Gradual Duration:  4 days Timing:  Constant Progression:  Worsening Chronicity:  Recurrent Relieved by:  Nothing Worsened by:  Nothing Associated symptoms: abdominal pain, flank pain and nausea   Associated symptoms: no fever and no vomiting   Abdominal pain:    Location:  RLQ   Quality: aching     Duration:  3 days   Chronicity:  New Risk factors: hx of pyelonephritis and recurrent urinary tract infections     Past Medical History Past Medical History:  Diagnosis Date   Alcohol abuse    Anxiety    takes Valium daily as needed and Ativan daily   Bilateral hearing loss    Bipolar 1 disorder ([place])    takes Lithium nightly and Synthroid daily   Chronic back pain    DDD; "all over" (09/14/2017)   Colitis, ischemic ([place]) 2012   Confusion    r/t meds   Depression    takes Prozac daily and Bupspirone    Diverticulosis    Dyslipidemia    takes Crestor daily   Fibromyalgia    Gastroparesis    Headache    "weekly" (09/14/2017)   Hepatic steatosis 06/18/12   severe   Hyperlipidemia    Hypertension    Hypothyroidism    IBS (irritable bowel syndrome)    Ischemic colitis ([place])    Joint pain    Joint swelling    Lupus erythematosus tumidus    tumid-skin   Migraine    "1-2/yr; maybe" (09/14/2017)   Numbness    in right foot   Osteoarthritis    "all over" (09/14/2017)   Osteoarthritis cervical spine    Osteoarthritis of hand    bilateral   Pneumonia    "walking pneumonia several times; long time since  the last time" (09/14/2017)   Restless leg syndrome    takes Requip nightly   Sciatica    Type II diabetes mellitus ([name])    Urinary frequency    Urinary leakage    Urinary urgency    Urinary, incontinence, stress female    Walking pneumonia    last time more than 78yr ago   Patient Active Problem List   Diagnosis Date Noted   C. difficile colitis 06/13/2021   Type 2 diabetes mellitus with diabetic polyneuropathy, with long-term current use of insulin ([place] 01/27/2021   Memory disorder 01/27/2021   Bipolar 1 disorder, depressed ([place] 01/27/2021   PTSD (post-traumatic stress disorder) 01/27/2021   Intractable abdominal pain 07/10/2020   Pancreatic abnormality 07/10/2020   Diabetes mellitus type 2 in obese ([place] 07/10/2020   Essential hypertension 07/10/2020   Bipolar 1 disorder, mixed, moderate ([name])    Suicidal ideations    Overweight (BMI 25.0-29.9) 02/08/2019   S/P left TKA 02/07/2019   Chest pain    Chest pain with normal coronary angiography    Diverticulitis 11/19/2016   Hypothyroidism 07/14/2016   Diarrhea    Generalized abdominal pain    Major depressive disorder, recurrent severe without psychotic features ([place] 12/31/2015   Nausea without vomiting    Colitis 10/28/2015   Unintentional poisoning  by psychotropic drug 07/05/2015   Restless leg syndrome 07/05/2015   Peripheral sensory neuropathy due to type 2 diabetes mellitus ([place]) 09/21/2014   Arthritis of left hip 06/09/2013   Arthritis of right hip 06/08/2013   Hip pain, right 05/02/2013   IBS (irritable bowel syndrome) 01/02/2013   Unspecified vitamin D deficiency 07/05/2012   Pure hyperglyceridemia 07/04/2012   Diabetes mellitus with diabetic neuropathy, without long-term current use of insulin ([place]) 06/18/2012   Hypertension 06/18/2012   Bipolar 1 disorder ([place]) 06/18/2012   Low back pain 02/25/2011   Acute ischemic colitis ([place]) 01/06/2011   Sciatica 12/19/2010   Fibromyalgia 10/28/2010   Primary  osteoarthritis of right hip 10/24/2010   Hyperlipidemia with target low density lipoprotein (LDL) cholesterol less than 100 mg/dL 05/05/2010   Anxiety and depression 05/05/2010   ABUSE, ALCOHOL, IN REMISSION 05/05/2010   OTITIS MEDIA, CHRONIC 05/05/2010   Hearing loss 05/05/2010   ALLERGIC RHINITIS, SEASONAL, MILD 05/05/2010   URINARY INCONTINENCE, STRESS, FEMALE 05/05/2010   OSTEOARTHRITIS, HANDS, BILATERAL 05/05/2010   OSTEOARTHRITIS, CERVICAL SPINE 05/05/2010   Home Medication(s) Prior to Admission medications   Medication Sig Start Date End Date Taking? Authorizing Provider  acetaminophen (TYLENOL) 325 MG tablet Take 2 tablets (650 mg total) by mouth every 6 (six) hours as needed. Patient taking differently: Take 650 mg by mouth every 6 (six) hours as needed for mild pain. 02/11/21   Jeanell Sparrow, DO  ALPRAZolam Duanne Moron) 0.5 MG tablet Take 1 tablet (0.5 mg total) by mouth 4 (four) times daily as needed for anxiety. 12/02/21   [name], PMHNP  atorvastatin (LIPITOR) 40 MG tablet Take 40 mg by mouth every evening.    [provider]  b complex vitamins capsule Take 1 capsule by mouth daily.    [provider]  BD PEN NEEDLE NANO 2ND GEN 32G X 4 MM MISC USE TO INJECT AS DIRECTED FIVE TIMES DAILY 10/15/21   Elayne Snare, MD  bismuth subsalicylate (PEPTO BISMOL) 262 MG/15ML suspension Take 30 mLs by mouth every 6 (six) hours as needed. Patient not taking: Reported on 11/04/2021    [provider]  cholecalciferol (VITAMIN D3) 25 MCG (1000 UNIT) tablet Take 1,000 Units by mouth daily. Also takes 2,000 units capsule at bedtime    [provider]  Cholecalciferol (VITAMIN D3) 50 MCG (2000 UT) TABS Take 2,000 Units by mouth at bedtime. Also takes 1,000 units capsule in the morning    [provider]  CONTOUR NEXT TEST test strip USE TO TEST BLOOD SUGAR TWICE DAILY 08/01/21   Elayne Snare, MD  dicyclomine (BENTYL) 20 MG tablet Take 1 tablet (20 mg  total) by mouth 3 (three) times daily as needed for spasms. 06/17/21   Shelly Coss, MD  divalproex (DEPAKOTE) 250 MG DR tablet Take 2 tablets (500 mg total) by mouth at bedtime. 11/04/21   [name], PMHNP  donepezil (ARICEPT) 10 MG tablet Take 1 tablet (10 mg total) by mouth at bedtime. 10/02/21   [name], PMHNP  ezetimibe (ZETIA) 10 MG tablet Take 1 tablet (10 mg total) by mouth daily. 10/07/21   Elayne Snare, MD  fluticasone Asencion Islam) 50 MCG/ACT nasal spray Place into both nostrils as needed for allergies or rhinitis.    [provider]  gabapentin (NEURONTIN) 600 MG tablet Take 600 mg by mouth 3 (three) times daily. 05/12/19   [provider]  insulin glargine (LANTUS SOLOSTAR) 100 UNIT/ML Solostar Pen ADMINISTER 66 UNITS UNDER THE SKIN AT BEDTIME  Patient taking differently: ADMINISTER 68 UNITS UNDER THE SKIN AT BEDTIME 10/07/21   Elayne Snare, MD  insulin lispro (HUMALOG KWIKPEN) 100 UNIT/ML KwikPen ADMINISTER 10 UNITS UNDER THE SKIN BEFORE MEALS Patient taking differently: Inject 10 Units into the skin 3 (three) times daily as needed (high blood sugar). 10 units before breakfast and lunch and 12 units before dinner 05/30/21   Elayne Snare, MD  levothyroxine (SYNTHROID) 75 MCG tablet TAKE 1 TABLET BY MOUTH EVERY DAY BEFORE BREAKFAST Patient taking differently: Take 75 mcg by mouth daily before breakfast. 03/27/21   Elayne Snare, MD  liothyronine (CYTOMEL) 5 MCG tablet TAKE 1 TABLET BY MOUTH DAILY WITH LEVOTHYROXINE. TAKE BEFORE BREAKFAST Patient taking differently: Take 5 mcg by mouth daily. 03/27/21   Elayne Snare, MD  lithium carbonate (ESKALITH) 450 MG CR tablet Take 1 tablet (450 mg total) by mouth at bedtime. 11/04/21   [name], PMHNP  loratadine (CLARITIN) 10 MG tablet Take 10 mg by mouth daily as needed for allergies.    [provider]  losartan (COZAAR) 25 MG tablet Take 25 mg by mouth every evening. 08/31/19   [provider]  memantine  (NAMENDA) 10 MG tablet TAKE 1 TABLET(10 MG) BY MOUTH EVERY EVENING 10/02/21   [name], PMHNP  Microlet Lancets MISC TEST DAILY AS DIRECTED 08/01/21   Elayne Snare, MD  nitroGLYCERIN (NITROSTAT) 0.4 MG SL tablet Place 1 tablet (0.4 mg total) under the tongue every 5 (five) minutes as needed for chest pain. 09/14/17   Lyda Jester M, PA-C  ondansetron (ZOFRAN) 4 MG tablet Take 1 tablet (4 mg total) by mouth every 6 (six) hours. 09/20/21   Curatolo, Adam, DO  oxyCODONE (ROXICODONE) 5 MG immediate release tablet Take 1 tablet (5 mg total) by mouth every 6 (six) hours as needed for up to 10 doses for breakthrough pain. Patient not taking: Reported on 10/02/2021 07/04/21   Lennice Sites, DO  oxyCODONE-acetaminophen (PERCOCET/ROXICET) 5-325 MG tablet Take 1 tablet by mouth every 6 (six) hours as needed for severe pain. Patient not taking: Reported on 10/02/2021 06/17/21   Shelly Coss, MD  QUEtiapine (SEROQUEL) 200 MG tablet TAKE 1 TAB PO q 5 pm and 1 tab po QHS 10/02/21   [name], PMHNP  rifaximin (XIFAXAN) 200 MG tablet Take 200 mg by mouth 3 (three) times daily. Patient not taking: Reported on 11/04/2021    [provider]  rOPINIRole (REQUIP) 0.5 MG tablet Takes 4 tabs at 5 pm and 3 tabs at bedtime and 1-2 tabs prn 10/02/21   [name], PMHNP  tiZANidine (ZANAFLEX) 4 MG tablet Take 1 tablet (4 mg total) by mouth every 6 (six) hours as needed for muscle spasms. Patient not taking: Reported on 05/22/4006 67/61/95   Delora Fuel, MD  vancomycin (VANCOCIN) 125 MG capsule Take 125 mg by mouth daily. Patient not taking: Reported on 11/04/2021 08/05/21   [provider]  Allergies Codeine, Procaine hcl, Epinephrine, Aspirin, Augmentin [amoxicillin-pot clavulanate], Benadryl [diphenhydramine], Diflucan [fluconazole], Dilaudid [hydromorphone hcl],  Hydromorphone, Morphine, Other, Prednisone, Tramadol hcl, and Sulfa antibiotics  Review of Systems Review of Systems  Constitutional:  Negative for fever.  Gastrointestinal:  Positive for abdominal pain and nausea. Negative for vomiting.  Genitourinary:  Positive for dysuria and flank pain.   As noted in HPI  Physical Exam Vital Signs  I have reviewed the triage vital signs BP 100/60 (BP Location: Right Arm)   Pulse 98   Temp 97.8 F (36.6 C) (Oral)   Resp 20   Ht '5\' 1"'$  (1.549 m)   Wt 87.5 kg   SpO2 95%   BMI 36.47 kg/m   Physical Exam Vitals reviewed.  Constitutional:      General: She is not in acute distress.    Appearance: She is well-developed. She is not diaphoretic.  HENT:     Head: Normocephalic and atraumatic.     Right Ear: External ear normal.     Left Ear: External ear normal.     Nose: Nose normal.  Eyes:     General: No scleral icterus.    Conjunctiva/sclera: Conjunctivae normal.  Neck:     Trachea: Phonation normal.  Cardiovascular:     Rate and Rhythm: Normal rate and regular rhythm.  Pulmonary:     Effort: Pulmonary effort is normal. No respiratory distress.     Breath sounds: No stridor.  Abdominal:     General: There is no distension.     Tenderness: There is abdominal tenderness in the right lower quadrant. There is right CVA tenderness and rebound. There is no guarding.  Musculoskeletal:        General: Normal range of motion.     Cervical back: Normal range of motion.  Neurological:     Mental Status: She is alert and oriented to person, place, and time.  Psychiatric:        Behavior: Behavior normal.     ED Results and Treatments Labs (all labs ordered are listed, but only abnormal results are displayed) Labs Reviewed  URINALYSIS, ROUTINE W REFLEX MICROSCOPIC - Abnormal; Notable for the following components:      Result Value   APPearance HAZY (*)    Hgb urine dipstick LARGE (*)    Protein, ur 100 (*)    Leukocytes,Ua LARGE (*)     RBC / HPF >50 (*)    WBC, UA >50 (*)    Bacteria, UA FEW (*)    Non Squamous Epithelial 0-5 (*)    All other components within normal limits  COMPREHENSIVE METABOLIC PANEL - Abnormal; Notable for the following components:   Glucose, Bld 208 (*)    Creatinine, Ser 1.01 (*)    Calcium 10.5 (*)    AST 14 (*)    GFR, Estimated 59 (*)    All other components within normal limits  CBC WITH DIFFERENTIAL/PLATELET - Abnormal; Notable for the following components:   WBC 13.3 (*)    Neutro Abs 9.6 (*)    All other components within normal limits  URINE CULTURE  LIPASE, BLOOD  EKG  EKG Interpretation  Date/Time:    Ventricular Rate:    PR Interval:    QRS Duration:   QT Interval:    QTC Calculation:   R Axis:     Text Interpretation:         Radiology CT ABDOMEN PELVIS W CONTRAST  Result Date: 11/09/2021 CLINICAL DATA:  Right lower quadrant abdominal pain. EXAM: CT ABDOMEN AND PELVIS WITH CONTRAST TECHNIQUE: Multidetector CT imaging of the abdomen and pelvis was performed using the standard protocol following bolus administration of intravenous contrast. RADIATION DOSE REDUCTION: This exam was performed according to the departmental dose-optimization program which includes automated exposure control, adjustment of the mA and/or kV according to patient size and/or use of iterative reconstruction technique. CONTRAST:  171m OMNIPAQUE IOHEXOL 300 MG/ML  SOLN COMPARISON:  CT abdomen pelvis dated 09/20/2021. FINDINGS: Lower chest: Minimal bibasilar dependent atelectasis. The visualized lung bases are otherwise clear. No intra-abdominal free air or free fluid. Hepatobiliary: No focal liver abnormality is seen. No gallstones, gallbladder wall thickening, or biliary dilatation. Pancreas: Unremarkable. No pancreatic ductal dilatation or surrounding inflammatory changes. Spleen:  Normal in size without focal abnormality. Adrenals/Urinary Tract: The adrenal glands unremarkable. There is mild left and moderate right hydronephrosis. There is a transition at the right ureteropelvic junction which may be related to UPJ stricture. A urothelial neoplasm is not excluded. This can be better evaluated with direct visualization or dedicated multiphasic CT urography. There is enhancement of the urothelium of the right renal collecting system and ureter suspicious for UTI. Correlation with urinalysis recommended. The urinary bladder is grossly unremarkable. Stomach/Bowel: Postsurgical changes of partial sigmoid resection with anastomotic suture. Several scattered sigmoid diverticula without active inflammatory changes. There is moderate stool throughout the colon. There is no bowel obstruction or active inflammation. The appendix is not visualized with certainty. No inflammatory changes identified in the right lower quadrant. Vascular/Lymphatic: Mild aortoiliac atherosclerotic disease. The IVC is unremarkable. No portal venous gas. There is no adenopathy. Reproductive: Hysterectomy.  No adnexal masses. Other: Small fat containing umbilical hernia. Musculoskeletal: Total right hip arthroplasty. Degenerative changes of the spine. No acute osseous pathology. IMPRESSION: 1. Moderate right hydronephrosis with a transition at the right ureteropelvic. Underlying urothelial stricture or lesion is not excluded. There is enhancement of the urothelium of the right renal collecting system and ureter suspicious for UTI. Correlation with urinalysis recommended. 2. Colonic diverticulosis. No bowel obstruction. 3. Aortic Atherosclerosis (ICD10-I70.0). Electronically Signed   By: AAnner CreteM.D.   On: 11/09/2021 01:38    Medications Ordered in ED Medications  sodium chloride 0.9 % bolus 1,000 mL (0 mLs Intravenous Stopped 11/09/21 0325)    And  0.9 %  sodium chloride infusion ( Intravenous New Bag/Given 11/09/21  0332)  ondansetron (ZOFRAN) injection 4 mg (has no administration in time range)  fentaNYL (SUBLIMAZE) injection 50 mcg (50 mcg Intravenous Given 11/09/21 0020)  ondansetron (ZOFRAN) injection 4 mg (4 mg Intravenous Given 11/09/21 0020)  iohexol (OMNIPAQUE) 300 MG/ML solution 100 mL (100 mLs Intravenous Contrast Given 11/09/21 0103)  cefTRIAXone (ROCEPHIN) 1 g in sodium chloride 0.9 % 100 mL IVPB (0 g Intravenous Stopped 11/09/21 0235)  ketorolac (TORADOL) 15 MG/ML injection 7.5 mg (7.5 mg Intravenous Given 11/09/21 0300)  Procedures .1-3 Lead EKG Interpretation  Performed by: Fatima Blank, MD Authorized by: Fatima Blank, MD     Interpretation: normal     ECG rate:  105   ECG rate assessment: tachycardic     Ectopy: none     Conduction: normal     (including critical care time)  Medical Decision Making / ED Course   Medical Decision Making Amount and/or Complexity of Data Reviewed External Data Reviewed: notes.    Details: OSH notes confirming HPI PMH Labs: ordered. Decision-making details documented in ED Course. Radiology: ordered and independent interpretation performed. Decision-making details documented in ED Course.  Risk Prescription drug management. Parenteral controlled substances. Decision regarding hospitalization.    Lower/right lower quadrant abdominal discomfort with urinary symptoms Patient is afebrile but tachycardic. Presentation is most concerning for UTI/pyelonephritis. We will need to assess for other intra-abdominal inflammatory/infectious process such as recurrent colitis.  Patient provided with IV fluids, IV pain medicine, and IV antiemetic.  CBC with leukocytosis.  No anemia. Metabolic panel without significant electrolyte derangement.  Hyperglycemia without evidence of DKA.  No evidence of bili obstruction  or pancreatitis. UA is consistent with urinary tract infection.  CT scan notable for right hydronephrosis.  No other intra-abdominal inflammatory/infectious process or bowel obstruction.  This was confirmed by radiology who also noted enhancements of the urothelium and right ureter concerning for UTI.  This is consistent with patient's history of pyelonephritis. Patient was given 1st dose CTX. Toradol for pain.  Patient remains with general fatigue and malaise.  She has persistent nausea after multiple doses of antiemetics.  We will consult hospitalist service to discuss admission and urology consult in the morning for hydronephrosis.       Final Clinical Impression(s) / ED Diagnoses Final diagnoses:  Acute pyelonephritis  Hydronephrosis with ureteral stricture, not elsewhere classified           This chart was dictated using voice recognition software.  Despite best efforts to proofread,  errors can occur which can change the documentation meaning.    Fatima Blank, MD 11/09/21 [phone]

## 2021-11-09 ENCOUNTER — Encounter (HOSPITAL_COMMUNITY): Payer: Self-pay

## 2021-11-09 ENCOUNTER — Emergency Department (HOSPITAL_BASED_OUTPATIENT_CLINIC_OR_DEPARTMENT_OTHER): Payer: Medicare Other

## 2021-11-09 DIAGNOSIS — E039 Hypothyroidism, unspecified: Secondary | ICD-10-CM | POA: Diagnosis present

## 2021-11-09 DIAGNOSIS — Z96641 Presence of right artificial hip joint: Secondary | ICD-10-CM | POA: Diagnosis present

## 2021-11-09 DIAGNOSIS — F0393 Unspecified dementia, unspecified severity, with mood disturbance: Secondary | ICD-10-CM | POA: Diagnosis present

## 2021-11-09 DIAGNOSIS — N12 Tubulo-interstitial nephritis, not specified as acute or chronic: Secondary | ICD-10-CM | POA: Diagnosis present

## 2021-11-09 DIAGNOSIS — M797 Fibromyalgia: Secondary | ICD-10-CM | POA: Diagnosis present

## 2021-11-09 DIAGNOSIS — L930 Discoid lupus erythematosus: Secondary | ICD-10-CM | POA: Diagnosis present

## 2021-11-09 DIAGNOSIS — M19041 Primary osteoarthritis, right hand: Secondary | ICD-10-CM | POA: Diagnosis present

## 2021-11-09 DIAGNOSIS — B9620 Unspecified Escherichia coli [E. coli] as the cause of diseases classified elsewhere: Secondary | ICD-10-CM | POA: Diagnosis present

## 2021-11-09 DIAGNOSIS — N10 Acute tubulo-interstitial nephritis: Secondary | ICD-10-CM | POA: Diagnosis present

## 2021-11-09 DIAGNOSIS — M479 Spondylosis, unspecified: Secondary | ICD-10-CM | POA: Diagnosis present

## 2021-11-09 DIAGNOSIS — I10 Essential (primary) hypertension: Secondary | ICD-10-CM | POA: Diagnosis present

## 2021-11-09 DIAGNOSIS — N136 Pyonephrosis: Secondary | ICD-10-CM | POA: Diagnosis present

## 2021-11-09 DIAGNOSIS — E1143 Type 2 diabetes mellitus with diabetic autonomic (poly)neuropathy: Secondary | ICD-10-CM | POA: Diagnosis present

## 2021-11-09 DIAGNOSIS — M19042 Primary osteoarthritis, left hand: Secondary | ICD-10-CM | POA: Diagnosis present

## 2021-11-09 DIAGNOSIS — E119 Type 2 diabetes mellitus without complications: Secondary | ICD-10-CM | POA: Diagnosis not present

## 2021-11-09 DIAGNOSIS — M545 Low back pain, unspecified: Secondary | ICD-10-CM | POA: Diagnosis present

## 2021-11-09 DIAGNOSIS — E785 Hyperlipidemia, unspecified: Secondary | ICD-10-CM | POA: Diagnosis present

## 2021-11-09 DIAGNOSIS — N139 Obstructive and reflux uropathy, unspecified: Secondary | ICD-10-CM

## 2021-11-09 DIAGNOSIS — N1330 Unspecified hydronephrosis: Secondary | ICD-10-CM | POA: Diagnosis not present

## 2021-11-09 DIAGNOSIS — F319 Bipolar disorder, unspecified: Secondary | ICD-10-CM | POA: Diagnosis present

## 2021-11-09 DIAGNOSIS — K589 Irritable bowel syndrome without diarrhea: Secondary | ICD-10-CM | POA: Diagnosis present

## 2021-11-09 DIAGNOSIS — R109 Unspecified abdominal pain: Secondary | ICD-10-CM | POA: Diagnosis not present

## 2021-11-09 DIAGNOSIS — G2581 Restless legs syndrome: Secondary | ICD-10-CM | POA: Diagnosis present

## 2021-11-09 DIAGNOSIS — Z6836 Body mass index (BMI) 36.0-36.9, adult: Secondary | ICD-10-CM | POA: Diagnosis not present

## 2021-11-09 DIAGNOSIS — I700 Atherosclerosis of aorta: Secondary | ICD-10-CM | POA: Diagnosis not present

## 2021-11-09 DIAGNOSIS — H9193 Unspecified hearing loss, bilateral: Secondary | ICD-10-CM | POA: Diagnosis present

## 2021-11-09 DIAGNOSIS — E669 Obesity, unspecified: Secondary | ICD-10-CM | POA: Diagnosis present

## 2021-11-09 DIAGNOSIS — G8929 Other chronic pain: Secondary | ICD-10-CM | POA: Diagnosis present

## 2021-11-09 DIAGNOSIS — Z794 Long term (current) use of insulin: Secondary | ICD-10-CM | POA: Diagnosis not present

## 2021-11-09 DIAGNOSIS — K3184 Gastroparesis: Secondary | ICD-10-CM | POA: Diagnosis present

## 2021-11-09 DIAGNOSIS — F419 Anxiety disorder, unspecified: Secondary | ICD-10-CM | POA: Diagnosis present

## 2021-11-09 LAB — COMPREHENSIVE METABOLIC PANEL
ALT: 17 U/L (ref 0–44)
AST: 14 U/L — ABNORMAL LOW (ref 15–41)
Albumin: 4 g/dL (ref 3.5–5.0)
Alkaline Phosphatase: 88 U/L (ref 38–126)
Anion gap: 13 (ref 5–15)
BUN: 11 mg/dL (ref 8–23)
CO2: 23 mmol/L (ref 22–32)
Calcium: 10.5 mg/dL — ABNORMAL HIGH (ref 8.9–10.3)
Chloride: 103 mmol/L (ref 98–111)
Creatinine, Ser: 1.01 mg/dL — ABNORMAL HIGH (ref 0.44–1.00)
GFR, Estimated: 59 mL/min — ABNORMAL LOW (ref >60)
Glucose, Bld: 208 mg/dL — ABNORMAL HIGH (ref 70–99)
Potassium: 4.5 mmol/L (ref 3.5–5.1)
Sodium: 139 mmol/L (ref 135–145)
Total Bilirubin: 0.3 mg/dL (ref 0.3–1.2)
Total Protein: 6.5 g/dL (ref 6.5–8.1)

## 2021-11-09 LAB — CBC WITH DIFFERENTIAL/PLATELET
Abs Immature Granulocytes: 0.06 10*3/uL (ref 0.00–0.07)
Basophils Absolute: 0.1 10*3/uL (ref 0.0–0.1)
Basophils Relative: 0 %
Eosinophils Absolute: 0.3 10*3/uL (ref 0.0–0.5)
Eosinophils Relative: 2 %
HCT: 42.7 % (ref 36.0–46.0)
Hemoglobin: 13.8 g/dL (ref 12.0–15.0)
Immature Granulocytes: 1 %
Lymphocytes Relative: 19 %
Lymphs Abs: 2.5 10*3/uL (ref 0.7–4.0)
MCH: 29.8 pg (ref 26.0–34.0)
MCHC: 32.3 g/dL (ref 30.0–36.0)
MCV: 92.2 fL (ref 80.0–100.0)
Monocytes Absolute: 0.7 10*3/uL (ref 0.1–1.0)
Monocytes Relative: 6 %
Neutro Abs: 9.6 10*3/uL — ABNORMAL HIGH (ref 1.7–7.7)
Neutrophils Relative %: 72 %
Platelets: 203 10*3/uL (ref 150–400)
RBC: 4.63 MIL/uL (ref 3.87–5.11)
RDW: 13.8 % (ref 11.5–15.5)
WBC: 13.3 10*3/uL — ABNORMAL HIGH (ref 4.0–10.5)
nRBC: 0 % (ref 0.0–0.2)

## 2021-11-09 LAB — LIPASE, BLOOD: Lipase: 48 U/L (ref 11–51)

## 2021-11-09 LAB — GLUCOSE, CAPILLARY
Glucose-Capillary: 94 mg/dL (ref 70–99)
Glucose-Capillary: 126 mg/dL — ABNORMAL HIGH (ref 70–99)
Glucose-Capillary: 108 mg/dL — ABNORMAL HIGH (ref 70–99)
Glucose-Capillary: 165 mg/dL — ABNORMAL HIGH (ref 70–99)

## 2021-11-09 MED ORDER — FENTANYL CITRATE PF 50 MCG/ML IJ SOSY: 12.5000 ug | PREFILLED_SYRINGE | INTRAMUSCULAR | Status: DC | PRN

## 2021-11-09 MED ORDER — VANCOMYCIN HCL 125 MG PO CAPS
125.0000 mg | ORAL_CAPSULE | Freq: Two times a day (BID) | ORAL | Status: DC
  Administered 2021-11-09 – 2021-11-12 (×7): 125 mg via ORAL
  Filled 2021-11-09 (×7): qty 1

## 2021-11-09 MED ORDER — SODIUM CHLORIDE 0.9 % IV SOLN
1.0000 g | INTRAVENOUS | Status: DC
  Administered 2021-11-09 – 2021-11-10 (×2): 1 g via INTRAVENOUS
  Filled 2021-11-09 (×2): qty 10

## 2021-11-09 MED ORDER — QUETIAPINE FUMARATE 100 MG PO TABS
200.0000 mg | ORAL_TABLET | Freq: Two times a day (BID) | ORAL | Status: DC
  Administered 2021-11-09 – 2021-11-11 (×6): 200 mg via ORAL
  Filled 2021-11-09 (×6): qty 2

## 2021-11-09 MED ORDER — LEVOTHYROXINE SODIUM 75 MCG PO TABS
75.0000 ug | ORAL_TABLET | Freq: Every day | ORAL | Status: DC
  Administered 2021-11-10 – 2021-11-12 (×3): 75 ug via ORAL
  Filled 2021-11-09 (×3): qty 1

## 2021-11-09 MED ORDER — EZETIMIBE 10 MG PO TABS
10.0000 mg | ORAL_TABLET | Freq: Every day | ORAL | Status: DC
  Administered 2021-11-09 – 2021-11-12 (×4): 10 mg via ORAL
  Filled 2021-11-09 (×4): qty 1

## 2021-11-09 MED ORDER — KETOROLAC TROMETHAMINE 15 MG/ML IJ SOLN
7.5000 mg | Freq: Four times a day (QID) | INTRAMUSCULAR | Status: DC | PRN
  Administered 2021-11-09: 7.5 mg via INTRAVENOUS
  Filled 2021-11-09: qty 1

## 2021-11-09 MED ORDER — ROPINIROLE HCL 1 MG PO TABS
1.5000 mg | ORAL_TABLET | Freq: Every day | ORAL | Status: DC
  Administered 2021-11-09 – 2021-11-11 (×3): 1.5 mg via ORAL
  Filled 2021-11-09: qty 2
  Filled 2021-11-09: qty 1
  Filled 2021-11-09 (×2): qty 2

## 2021-11-09 MED ORDER — ACETAMINOPHEN 325 MG PO TABS
650.0000 mg | ORAL_TABLET | Freq: Four times a day (QID) | ORAL | Status: DC | PRN
  Administered 2021-11-09 – 2021-11-12 (×4): 650 mg via ORAL
  Filled 2021-11-09 (×5): qty 2

## 2021-11-09 MED ORDER — LITHIUM CARBONATE ER 450 MG PO TBCR
450.0000 mg | EXTENDED_RELEASE_TABLET | Freq: Every day | ORAL | Status: DC
  Administered 2021-11-09 – 2021-11-11 (×3): 450 mg via ORAL
  Filled 2021-11-09 (×3): qty 1

## 2021-11-09 MED ORDER — ROPINIROLE HCL 1 MG PO TABS
2.0000 mg | ORAL_TABLET | ORAL | Status: DC
  Administered 2021-11-09 – 2021-11-11 (×3): 2 mg via ORAL
  Filled 2021-11-09 (×3): qty 2

## 2021-11-09 MED ORDER — ONDANSETRON HCL 4 MG PO TABS: 4.0000 mg | ORAL_TABLET | Freq: Four times a day (QID) | ORAL | Status: DC | PRN

## 2021-11-09 MED ORDER — ORAL CARE MOUTH RINSE: 15.0000 mL | OROMUCOSAL | Status: DC | PRN

## 2021-11-09 MED ORDER — DIVALPROEX SODIUM 250 MG PO DR TAB
500.0000 mg | DELAYED_RELEASE_TABLET | Freq: Every day | ORAL | Status: DC
  Administered 2021-11-09 – 2021-11-11 (×3): 500 mg via ORAL
  Filled 2021-11-09 (×3): qty 2

## 2021-11-09 MED ORDER — SODIUM CHLORIDE 0.9 % IV SOLN
1.0000 g | Freq: Once | INTRAVENOUS | Status: AC
  Administered 2021-11-09: 1 g via INTRAVENOUS
  Filled 2021-11-09: qty 10

## 2021-11-09 MED ORDER — ROPINIROLE HCL 0.25 MG PO TABS: 0.5000 mg | ORAL_TABLET | Freq: Every evening | ORAL | Status: DC | PRN

## 2021-11-09 MED ORDER — FLUTICASONE PROPIONATE 50 MCG/ACT NA SUSP
1.0000 | NASAL | Status: DC | PRN
  Filled 2021-11-09: qty 16

## 2021-11-09 MED ORDER — IOHEXOL 300 MG/ML  SOLN
100.0000 mL | Freq: Once | INTRAMUSCULAR | Status: AC | PRN
  Administered 2021-11-09: 100 mL via INTRAVENOUS

## 2021-11-09 MED ORDER — DONEPEZIL HCL 10 MG PO TABS
10.0000 mg | ORAL_TABLET | Freq: Every day | ORAL | Status: DC
  Administered 2021-11-09 – 2021-11-11 (×3): 10 mg via ORAL
  Filled 2021-11-09 (×3): qty 1

## 2021-11-09 MED ORDER — ALPRAZOLAM 0.5 MG PO TABS
0.5000 mg | ORAL_TABLET | Freq: Four times a day (QID) | ORAL | Status: DC | PRN
  Administered 2021-11-09 – 2021-11-11 (×4): 0.5 mg via ORAL
  Filled 2021-11-09 (×4): qty 1

## 2021-11-09 MED ORDER — DICYCLOMINE HCL 20 MG PO TABS
20.0000 mg | ORAL_TABLET | Freq: Three times a day (TID) | ORAL | Status: DC | PRN
  Administered 2021-11-11: 20 mg via ORAL
  Filled 2021-11-09: qty 1

## 2021-11-09 MED ORDER — INSULIN GLARGINE-YFGN 100 UNIT/ML ~~LOC~~ SOLN
68.0000 [IU] | Freq: Every day | SUBCUTANEOUS | Status: DC
  Administered 2021-11-09 – 2021-11-11 (×3): 68 [IU] via SUBCUTANEOUS
  Filled 2021-11-09 (×4): qty 0.68

## 2021-11-09 MED ORDER — INSULIN ASPART 100 UNIT/ML IJ SOLN
0.0000 [IU] | Freq: Every day | INTRAMUSCULAR | Status: DC
  Administered 2021-11-11: 3 [IU] via SUBCUTANEOUS

## 2021-11-09 MED ORDER — GABAPENTIN 300 MG PO CAPS
600.0000 mg | ORAL_CAPSULE | Freq: Three times a day (TID) | ORAL | Status: DC
  Administered 2021-11-09 – 2021-11-12 (×9): 600 mg via ORAL
  Filled 2021-11-09 (×10): qty 2

## 2021-11-09 MED ORDER — LIOTHYRONINE SODIUM 5 MCG PO TABS
5.0000 ug | ORAL_TABLET | Freq: Every day | ORAL | Status: DC
  Administered 2021-11-10 – 2021-11-12 (×3): 5 ug via ORAL
  Filled 2021-11-09 (×4): qty 1

## 2021-11-09 MED ORDER — INSULIN ASPART 100 UNIT/ML IJ SOLN
0.0000 [IU] | Freq: Three times a day (TID) | INTRAMUSCULAR | Status: DC
  Administered 2021-11-09: 2 [IU] via SUBCUTANEOUS
  Administered 2021-11-10: 3 [IU] via SUBCUTANEOUS
  Administered 2021-11-10: 2 [IU] via SUBCUTANEOUS
  Administered 2021-11-11: 3 [IU] via SUBCUTANEOUS
  Administered 2021-11-12: 2 [IU] via SUBCUTANEOUS

## 2021-11-09 MED ORDER — ACETAMINOPHEN 650 MG RE SUPP: 650.0000 mg | Freq: Four times a day (QID) | RECTAL | Status: DC | PRN

## 2021-11-09 MED ORDER — ATORVASTATIN CALCIUM 40 MG PO TABS
40.0000 mg | ORAL_TABLET | Freq: Every evening | ORAL | Status: DC
  Administered 2021-11-09 – 2021-11-11 (×3): 40 mg via ORAL
  Filled 2021-11-09 (×3): qty 1

## 2021-11-09 MED ORDER — OXYCODONE HCL 5 MG PO TABS
5.0000 mg | ORAL_TABLET | Freq: Four times a day (QID) | ORAL | Status: DC | PRN
  Administered 2021-11-09 – 2021-11-12 (×6): 5 mg via ORAL
  Filled 2021-11-09 (×6): qty 1

## 2021-11-09 MED ORDER — KETOROLAC TROMETHAMINE 15 MG/ML IJ SOLN
7.5000 mg | Freq: Once | INTRAMUSCULAR | Status: AC
  Administered 2021-11-09: 7.5 mg via INTRAVENOUS
  Filled 2021-11-09: qty 1

## 2021-11-09 MED ORDER — ONDANSETRON HCL 4 MG/2ML IJ SOLN
4.0000 mg | Freq: Once | INTRAMUSCULAR | Status: AC
  Administered 2021-11-09: 4 mg via INTRAVENOUS
  Filled 2021-11-09: qty 2

## 2021-11-09 MED ORDER — ONDANSETRON HCL 4 MG/2ML IJ SOLN: 4.0000 mg | Freq: Four times a day (QID) | INTRAMUSCULAR | Status: DC | PRN

## 2021-11-09 MED ORDER — MEMANTINE HCL 10 MG PO TABS
10.0000 mg | ORAL_TABLET | Freq: Every day | ORAL | Status: DC
  Administered 2021-11-09 – 2021-11-11 (×3): 10 mg via ORAL
  Filled 2021-11-09 (×3): qty 1

## 2021-11-09 MED ORDER — LORATADINE 10 MG PO TABS: 10.0000 mg | ORAL_TABLET | Freq: Every day | ORAL | Status: DC | PRN

## 2021-11-09 MED ORDER — TIZANIDINE HCL 4 MG PO TABS
4.0000 mg | ORAL_TABLET | Freq: Four times a day (QID) | ORAL | Status: DC | PRN
  Filled 2021-11-09: qty 1

## 2021-11-09 NOTE — H&P (Addendum)
History and Physical    Patient: [name] [name] DOB: [birth year] DOA: 11/08/2021 DOS: the patient was seen and examined on 11/09/2021 PCP: Nahser, Wonda Cheng, MD  Patient coming from: Home  Chief Complaint:  Chief Complaint  Patient presents with   Dysuria   HPI: [name] [name] is a 76 y.o. female with medical history significant of DM2, HTN, bipolar d/o. Presenting with flank pain and dysuria. Symptoms started 3 days ago. She had right flank pain that she describes as "hard". It came in waves. She tried APAP but it didn't help. During this same time, she noticed increased frequency and pain w/ urination. She noticed that her urine was cloudy. She had nausea, but on vomiting or fevers. When her symptoms did not improve last night, she decided to come to the ED for assistance. She denies any other aggravating or alleviating factors.     Review of Systems: As mentioned in the history of present illness. All other systems reviewed and are negative. Past Medical History:  Diagnosis Date   Alcohol abuse    Anxiety    takes Valium daily as needed and Ativan daily   Bilateral hearing loss    Bipolar 1 disorder ([place])    takes Lithium nightly and Synthroid daily   Chronic back pain    DDD; "all over" (09/14/2017)   Colitis, ischemic ([place]) 2012   Confusion    r/t meds   Depression    takes Prozac daily and Bupspirone    Diverticulosis    Dyslipidemia    takes Crestor daily   Fibromyalgia    Gastroparesis    Headache    "weekly" (09/14/2017)   Hepatic steatosis 06/18/12   severe   Hyperlipidemia    Hypertension    Hypothyroidism    IBS (irritable bowel syndrome)    Ischemic colitis ([place])    Joint pain    Joint swelling    Lupus erythematosus tumidus    tumid-skin   Migraine    "1-2/yr; maybe" (09/14/2017)   Numbness    in right foot   Osteoarthritis    "all over" (09/14/2017)   Osteoarthritis cervical spine    Osteoarthritis of hand    bilateral   Pneumonia     "walking pneumonia several times; long time since the last time" (09/14/2017)   Restless leg syndrome    takes Requip nightly   Sciatica    Type II diabetes mellitus ([place])    Urinary frequency    Urinary leakage    Urinary urgency    Urinary, incontinence, stress female    Walking pneumonia    last time more than 24yr ago   Past Surgical History:  Procedure Laterality Date   ABDOMINAL HYSTERECTOMY     "they left my ovaries"   APPENDECTOMY     BALLOON DILATION N/A 06/14/2020   Procedure: BALLOON DILATION;  Surgeon: KRonnette Juniper MD;  Location: [facility];  Service: Gastroenterology;  Laterality: N/A;   BIOPSY  06/14/2020   Procedure: BIOPSY;  Surgeon: KRonnette Juniper MD;  Location: [facility];  Service: Gastroenterology;;   CARDIAC CATHETERIZATION  09/14/2017   COLON RESECTION  05/2021   COLONOSCOPY     DENTAL SURGERY Left 10/2016   dental implant   DILATION AND CURETTAGE OF UTERUS  X 4   ESOPHAGOGASTRODUODENOSCOPY     ESOPHAGOGASTRODUODENOSCOPY (EGD) WITH PROPOFOL N/A 06/14/2020   Procedure: ESOPHAGOGASTRODUODENOSCOPY (EGD) WITH PROPOFOL;  Surgeon: KRonnette Juniper MD;  Location: [facility];  Service: Gastroenterology;  Laterality:  N/A;   FLEXIBLE SIGMOIDOSCOPY N/A 06/21/2012   Procedure: FLEXIBLE SIGMOIDOSCOPY;  Surgeon: Jerene Bears, MD;  Location: [facility];  Service: Gastroenterology;  Laterality: N/A;   JOINT REPLACEMENT     LEFT HEART CATH AND CORONARY ANGIOGRAPHY N/A 09/14/2017   Procedure: LEFT HEART CATH AND CORONARY ANGIOGRAPHY;  Surgeon: Belva Crome, MD;  Location: [facility];  Service: Cardiovascular;  Laterality: N/A;   SHOULDER ARTHROSCOPY Right    "shaved spurs off rotator cuff"   TONSILLECTOMY     TOTAL HIP ARTHROPLASTY Right 06/09/2013   Procedure: TOTAL HIP ARTHROPLASTY;  Surgeon: Kerin Salen, MD;  Location: [facility];  Service: Orthopedics;  Laterality: Right;   TOTAL KNEE ARTHROPLASTY Left 02/07/2019   Procedure: TOTAL KNEE ARTHROPLASTY;  Surgeon:  Paralee Cancel, MD;  Location: [facility];  Service: Orthopedics;  Laterality: Left;  70 mins   TUBAL LIGATION     TUMOR EXCISION Right 1968   angle of jaw; benign   Social History:  reports that she has never smoked. She has never used smokeless tobacco. She reports that she does not currently use alcohol. She reports that she does not currently use drugs after having used the following drugs: Benzodiazepines. Frequency: 7.00 times per week.  Allergies  Allergen Reactions   Codeine Anxiety and Other (See Comments)    Hallucinations, tolerates oxycodone    Procaine Hcl Palpitations   Epinephrine     Other reaction(s): Other (See Comments) Makes heart race at the dentist Makes heart race at the dentist    Aspirin Nausea And Vomiting and Other (See Comments)    Reaction:  Burns pts stomach    Augmentin [Amoxicillin-Pot Clavulanate] Diarrhea   Benadryl [Diphenhydramine] Other (See Comments)    Per MD "inhibits potency of gabapentin, lithium etc"   Diflucan [Fluconazole] Other (See Comments)    Unknown reaction    Dilaudid [Hydromorphone Hcl] Other (See Comments)    Migraines and nightmares    Hydromorphone Other (See Comments)   Morphine Other (See Comments)    headache   Other Nausea And Vomiting and Other (See Comments)    Pt states that all -mycins cause N/V. (MACROLIDES)    Prednisone     Long term steroid problems with lithium and blood sugar.    Tramadol Hcl     Other reaction(s): felt weird   Sulfa Antibiotics Other (See Comments)    Unknown reaction    Family History  Problem Relation Age of Onset   Drug abuse Mother    Alcohol abuse Mother    Alcohol abuse Father    Hypertension Father    CAD Brother    Hypertension Brother    Alcohol abuse Brother    Hypertension Brother    Hypertension Brother    Psoriasis Daughter    Arthritis Daughter        psoriatic arthritis    Alcohol abuse Grandchild     Prior to Admission medications   Medication Sig Start Date End  Date Taking? Authorizing Provider  acetaminophen (TYLENOL) 325 MG tablet Take 2 tablets (650 mg total) by mouth every 6 (six) hours as needed. Patient taking differently: Take 650 mg by mouth every 6 (six) hours as needed for mild pain. 02/11/21   Jeanell Sparrow, DO  ALPRAZolam Duanne Moron) 0.5 MG tablet Take 1 tablet (0.5 mg total) by mouth 4 (four) times daily as needed for anxiety. 12/02/21   [name], PMHNP  atorvastatin (LIPITOR) 40 MG tablet Take 40 mg  by mouth every evening.    [provider]  b complex vitamins capsule Take 1 capsule by mouth daily.    [provider]  BD PEN NEEDLE NANO 2ND GEN 32G X 4 MM MISC USE TO INJECT AS DIRECTED FIVE TIMES DAILY 10/15/21   Elayne Snare, MD  bismuth subsalicylate (PEPTO BISMOL) 262 MG/15ML suspension Take 30 mLs by mouth every 6 (six) hours as needed. Patient not taking: Reported on 11/04/2021    [provider]  cholecalciferol (VITAMIN D3) 25 MCG (1000 UNIT) tablet Take 1,000 Units by mouth daily. Also takes 2,000 units capsule at bedtime    [provider]  Cholecalciferol (VITAMIN D3) 50 MCG (2000 UT) TABS Take 2,000 Units by mouth at bedtime. Also takes 1,000 units capsule in the morning    [provider]  CONTOUR NEXT TEST test strip USE TO TEST BLOOD SUGAR TWICE DAILY 08/01/21   Elayne Snare, MD  dicyclomine (BENTYL) 20 MG tablet Take 1 tablet (20 mg total) by mouth 3 (three) times daily as needed for spasms. 06/17/21   Shelly Coss, MD  divalproex (DEPAKOTE) 250 MG DR tablet Take 2 tablets (500 mg total) by mouth at bedtime. 11/04/21   [name], PMHNP  donepezil (ARICEPT) 10 MG tablet Take 1 tablet (10 mg total) by mouth at bedtime. 10/02/21   [name], PMHNP  ezetimibe (ZETIA) 10 MG tablet Take 1 tablet (10 mg total) by mouth daily. 10/07/21   Elayne Snare, MD  fluticasone Asencion Islam) 50 MCG/ACT nasal spray Place into both nostrils as needed for allergies or rhinitis.    [provider]  gabapentin (NEURONTIN) 600 MG tablet Take 600 mg by mouth 3 (three) times daily. 05/12/19   [provider]  insulin glargine (LANTUS SOLOSTAR) 100 UNIT/ML Solostar Pen ADMINISTER 66 UNITS UNDER THE SKIN AT BEDTIME Patient taking differently: ADMINISTER 68 UNITS UNDER THE SKIN AT BEDTIME 10/07/21   Elayne Snare, MD  insulin lispro (HUMALOG KWIKPEN) 100 UNIT/ML KwikPen ADMINISTER 10 UNITS UNDER THE SKIN BEFORE MEALS Patient taking differently: Inject 10 Units into the skin 3 (three) times daily as needed (high blood sugar). 10 units before breakfast and lunch and 12 units before dinner 05/30/21   Elayne Snare, MD  levothyroxine (SYNTHROID) 75 MCG tablet TAKE 1 TABLET BY MOUTH EVERY DAY BEFORE BREAKFAST Patient taking differently: Take 75 mcg by mouth daily before breakfast. 03/27/21   Elayne Snare, MD  liothyronine (CYTOMEL) 5 MCG tablet TAKE 1 TABLET BY MOUTH DAILY WITH LEVOTHYROXINE. TAKE BEFORE BREAKFAST Patient taking differently: Take 5 mcg by mouth daily. 03/27/21   Elayne Snare, MD  lithium carbonate (ESKALITH) 450 MG CR tablet Take 1 tablet (450 mg total) by mouth at bedtime. 11/04/21   [name], PMHNP  loratadine (CLARITIN) 10 MG tablet Take 10 mg by mouth daily as needed for allergies.    [provider]  losartan (COZAAR) 25 MG tablet Take 25 mg by mouth every evening. 08/31/19   [provider]  memantine (NAMENDA) 10 MG tablet TAKE 1 TABLET(10 MG) BY MOUTH EVERY EVENING 10/02/21   [name], PMHNP  Microlet Lancets MISC TEST DAILY AS DIRECTED 08/01/21   Elayne Snare, MD  nitroGLYCERIN (NITROSTAT) 0.4 MG SL tablet Place 1 tablet (0.4 mg total) under the tongue every 5 (five) minutes as needed for chest pain. 09/14/17   Lyda Jester M, PA-C  ondansetron (ZOFRAN) 4 MG tablet Take 1 tablet (4 mg total) by mouth every 6 (six) hours. 09/20/21  Curatolo, Adam, DO  oxyCODONE (ROXICODONE) 5 MG immediate release tablet Take 1 tablet (5 mg total) by mouth every 6  (six) hours as needed for up to 10 doses for breakthrough pain. Patient not taking: Reported on 10/02/2021 07/04/21   Lennice Sites, DO  oxyCODONE-acetaminophen (PERCOCET/ROXICET) 5-325 MG tablet Take 1 tablet by mouth every 6 (six) hours as needed for severe pain. Patient not taking: Reported on 10/02/2021 06/17/21   Shelly Coss, MD  QUEtiapine (SEROQUEL) 200 MG tablet TAKE 1 TAB PO q 5 pm and 1 tab po QHS 10/02/21   [name], PMHNP  rifaximin (XIFAXAN) 200 MG tablet Take 200 mg by mouth 3 (three) times daily. Patient not taking: Reported on 11/04/2021    [provider]  rOPINIRole (REQUIP) 0.5 MG tablet Takes 4 tabs at 5 pm and 3 tabs at bedtime and 1-2 tabs prn 10/02/21   [name], PMHNP  tiZANidine (ZANAFLEX) 4 MG tablet Take 1 tablet (4 mg total) by mouth every 6 (six) hours as needed for muscle spasms. Patient not taking: Reported on 09/06/1599 09/32/35   Delora Fuel, MD  vancomycin (VANCOCIN) 125 MG capsule Take 125 mg by mouth daily. Patient not taking: Reported on 11/04/2021 08/05/21   [provider]    Physical Exam: Vitals:   11/09/21 0600 11/09/21 0715 11/09/21 0730 11/09/21 0733  BP: 128/63 (!) 115/56 116/62   Pulse: 81 74 80   Resp: _0 Temp:    97.9 F (36.6 C)  TempSrc:    Oral  SpO2: 93% 90% 93%   Weight:      Height:       General: 76 y.o. female resting in bed in NAD Eyes: PERRL, normal sclera ENMT: Nares patent w/o discharge, orophaynx clear, dentition normal, ears w/o discharge/lesions/ulcers Neck: Supple, trachea midline Cardiovascular: RRR, +S1, S2, no m/g/r, equal pulses throughout Respiratory: CTABL, no w/r/r, normal WOB GI: BS+, NDNT, no masses noted, no organomegaly noted MSK: No e/c/c, RCVAT Neuro: A&O x 3, no focal deficits Psyc: Appropriate interaction and affect, calm/cooperative  Data Reviewed:  Lab Results  Component Value Date   NA 139 11/09/2021   K 4.5 11/09/2021   CO2 23 11/09/2021   GLUCOSE 208 (H)  11/09/2021   BUN 11 11/09/2021   CREATININE 1.01 (H) 11/09/2021   CALCIUM 10.5 (H) 11/09/2021   EGFR 69 01/27/2021   GFRNONAA 59 (L) 11/09/2021   Lab Results  Component Value Date   WBC 13.3 (H) 11/09/2021   HGB 13.8 11/09/2021   HCT 42.7 11/09/2021   MCV 92.2 11/09/2021   PLT 203 11/09/2021   CT ab/pelvis w/ 1. Moderate right hydronephrosis with a transition at the right ureteropelvic. Underlying urothelial stricture or lesion is not excluded. There is enhancement of the urothelium of the right renal collecting system and ureter suspicious for UTI. Correlation with urinalysis recommended. 2. Colonic diverticulosis. No bowel obstruction. 3. Aortic Atherosclerosis (ICD10-I70.0).  Assessment and Plan: Pyelonephritis Urinary obstruction     - placed in obs, tele     - place foley     - continue rocephin     - urology consulted, appreciate assistance     - fluids  Recent fecal transplant for recurrent c diff     - spoke with ID; rec'd vanc 125 mg PO BID for the duration of abx + 7 days  DM2     - SSI, glucose checks, DM diet     - A1c on 6.8  HTN     - pressures are a little soft; will hold home regimen for today; reassess in AM for resuming those meds  Bipolar d/o     - continue home regimen when confirmed  Hypothyroidism     - continue home regimen when confirmed  RLS     - continue home regimen when confirmed  HLD     - continue home regimen when confirmed  Advance Care Planning:   Code Status: FULL  Consults: Urology  Family Communication: None at bedside  Severity of Illness: The appropriate patient status for this patient is OBSERVATION. Observation status is judged to be reasonable and necessary in order to provide the required intensity of service to ensure the patient's safety. The patient's presenting symptoms, physical exam findings, and initial radiographic and laboratory data in the context of their medical condition is felt to place them at  decreased risk for further clinical deterioration. Furthermore, it is anticipated that the patient will be medically stable for discharge from the hospital within 2 midnights of admission.   Time spent in coordination of this H&P: 47 minutes  Author: Jonnie Finner, DO 11/09/2021 8:20 AM  For on call review [email or web address].

## 2021-11-09 NOTE — Consult Note (Signed)
Urology Consult   Reason for consult: pyelonephritis, hydronephrosis on CT  History of Present Illness: [name] [name] is a 76 y.o. who presented to the ED late yesterday c/o R flank pain with increased urinary frequency and dysuria.  [name] [name] had a CT scan which shows some stranding and proximal collecting system enhancement/thickening suspicious for pyelonephritis.  [name] [name] is known to [facility]; she was last seen Jan 2022 by Dr Tresa Moore. She has R hydronephrosis dating back to 2012. She's previously had a renogram confirming no obstruction was present.   UA positive for WBC, RBC, LE; White count 13.3, Cr 1.01  This morning, [name] [name] reports she is feeling better, but still endorses some right sided discomfort.   Past Medical History:  Diagnosis Date   Alcohol abuse    Anxiety    takes Valium daily as needed and Ativan daily   Bilateral hearing loss    Bipolar 1 disorder ([place])    takes Lithium nightly and Synthroid daily   Chronic back pain    DDD; "all over" (09/14/2017)   Colitis, ischemic ([place]) 2012   Confusion    r/t meds   Depression    takes Prozac daily and Bupspirone    Diverticulosis    Dyslipidemia    takes Crestor daily   Fibromyalgia    Gastroparesis    Headache    "weekly" (09/14/2017)   Hepatic steatosis 06/18/12   severe   Hyperlipidemia    Hypertension    Hypothyroidism    IBS (irritable bowel syndrome)    Ischemic colitis ([place])    Joint pain    Joint swelling    Lupus erythematosus tumidus    tumid-skin   Migraine    "1-2/yr; maybe" (09/14/2017)   Numbness    in right foot   Osteoarthritis    "all over" (09/14/2017)   Osteoarthritis cervical spine    Osteoarthritis of hand    bilateral   Pneumonia    "walking pneumonia several times; long time since the last time" (09/14/2017)   Restless leg syndrome    takes Requip nightly   Sciatica    Type II diabetes mellitus ([place])    Urinary frequency    Urinary leakage    Urinary urgency    Urinary,  incontinence, stress female    Walking pneumonia    last time more than 48yr ago    Past Surgical History:  Procedure Laterality Date   ABDOMINAL HYSTERECTOMY     "they left my ovaries"   APPENDECTOMY     BALLOON DILATION N/A 06/14/2020   Procedure: BALLOON DILATION;  Surgeon: KRonnette Juniper MD;  Location: [facility];  Service: Gastroenterology;  Laterality: N/A;   BIOPSY  06/14/2020   Procedure: BIOPSY;  Surgeon: KRonnette Juniper MD;  Location: [facility];  Service: Gastroenterology;;   CARDIAC CATHETERIZATION  09/14/2017   COLON RESECTION  05/2021   COLONOSCOPY     DENTAL SURGERY Left 10/2016   dental implant   DILATION AND CURETTAGE OF UTERUS  X 4   ESOPHAGOGASTRODUODENOSCOPY     ESOPHAGOGASTRODUODENOSCOPY (EGD) WITH PROPOFOL N/A 06/14/2020   Procedure: ESOPHAGOGASTRODUODENOSCOPY (EGD) WITH PROPOFOL;  Surgeon: KRonnette Juniper MD;  Location: [facility];  Service: Gastroenterology;  Laterality: N/A;   FLEXIBLE SIGMOIDOSCOPY N/A 06/21/2012   Procedure: FLEXIBLE SIGMOIDOSCOPY;  Surgeon: JJerene Bears MD;  Location: [facility];  Service: Gastroenterology;  Laterality: N/A;   JOINT REPLACEMENT     LEFT HEART CATH AND CORONARY ANGIOGRAPHY N/A 09/14/2017   Procedure:  LEFT HEART CATH AND CORONARY ANGIOGRAPHY;  Surgeon: Belva Crome, MD;  Location: [facility];  Service: Cardiovascular;  Laterality: N/A;   SHOULDER ARTHROSCOPY Right    "shaved spurs off rotator cuff"   TONSILLECTOMY     TOTAL HIP ARTHROPLASTY Right 06/09/2013   Procedure: TOTAL HIP ARTHROPLASTY;  Surgeon: Kerin Salen, MD;  Location: [facility];  Service: Orthopedics;  Laterality: Right;   TOTAL KNEE ARTHROPLASTY Left 02/07/2019   Procedure: TOTAL KNEE ARTHROPLASTY;  Surgeon: Paralee Cancel, MD;  Location: [facility];  Service: Orthopedics;  Laterality: Left;  70 mins   TUBAL LIGATION     TUMOR EXCISION Right 1968   angle of jaw; benign     Current Hospital Medications:  Home meds:  No current  facility-administered medications on file prior to encounter.   Current Outpatient Medications on File Prior to Encounter  Medication Sig Dispense Refill   acetaminophen (TYLENOL) 325 MG tablet Take 2 tablets (650 mg total) by mouth every 6 (six) hours as needed. (Patient taking differently: Take 650 mg by mouth every 6 (six) hours as needed for mild pain.) 36 tablet 0   [START ON 12/02/2021] ALPRAZolam (XANAX) 0.5 MG tablet Take 1 tablet (0.5 mg total) by mouth 4 (four) times daily as needed for anxiety. 120 tablet 2   atorvastatin (LIPITOR) 40 MG tablet Take 40 mg by mouth every evening.  0   b complex vitamins capsule Take 1 capsule by mouth daily.     BD PEN NEEDLE NANO 2ND GEN 32G X 4 MM MISC USE TO INJECT AS DIRECTED FIVE TIMES DAILY 300 each 0   bismuth subsalicylate (PEPTO BISMOL) 262 MG/15ML suspension Take 30 mLs by mouth every 6 (six) hours as needed. (Patient not taking: Reported on 11/04/2021)     cholecalciferol (VITAMIN D3) 25 MCG (1000 UNIT) tablet Take 1,000 Units by mouth daily. Also takes 2,000 units capsule at bedtime     Cholecalciferol (VITAMIN D3) 50 MCG (2000 UT) TABS Take 2,000 Units by mouth at bedtime. Also takes 1,000 units capsule in the morning     CONTOUR NEXT TEST test strip USE TO TEST BLOOD SUGAR TWICE DAILY 100 strip 3   dicyclomine (BENTYL) 20 MG tablet Take 1 tablet (20 mg total) by mouth 3 (three) times daily as needed for spasms. 20 tablet 0   divalproex (DEPAKOTE) 250 MG DR tablet Take 2 tablets (500 mg total) by mouth at bedtime. 180 tablet 0   donepezil (ARICEPT) 10 MG tablet Take 1 tablet (10 mg total) by mouth at bedtime. 90 tablet 0   ezetimibe (ZETIA) 10 MG tablet Take 1 tablet (10 mg total) by mouth daily. 90 tablet 3   fluticasone (FLONASE) 50 MCG/ACT nasal spray Place into both nostrils as needed for allergies or rhinitis.     gabapentin (NEURONTIN) 600 MG tablet Take 600 mg by mouth 3 (three) times daily.     insulin glargine (LANTUS SOLOSTAR) 100  UNIT/ML Solostar Pen ADMINISTER 66 UNITS UNDER THE SKIN AT BEDTIME (Patient taking differently: ADMINISTER 68 UNITS UNDER THE SKIN AT BEDTIME) 15 mL 2   insulin lispro (HUMALOG KWIKPEN) 100 UNIT/ML KwikPen ADMINISTER 10 UNITS UNDER THE SKIN BEFORE MEALS (Patient taking differently: Inject 10 Units into the skin 3 (three) times daily as needed (high blood sugar). 10 units before breakfast and lunch and 12 units before dinner) 15 mL 0   levothyroxine (SYNTHROID) 75 MCG tablet TAKE 1 TABLET BY MOUTH EVERY DAY BEFORE BREAKFAST (Patient  taking differently: Take 75 mcg by mouth daily before breakfast.) 90 tablet 1   liothyronine (CYTOMEL) 5 MCG tablet TAKE 1 TABLET BY MOUTH DAILY WITH LEVOTHYROXINE. TAKE BEFORE BREAKFAST (Patient taking differently: Take 5 mcg by mouth daily.) 90 tablet 1   lithium carbonate (ESKALITH) 450 MG CR tablet Take 1 tablet (450 mg total) by mouth at bedtime. 90 tablet 0   loratadine (CLARITIN) 10 MG tablet Take 10 mg by mouth daily as needed for allergies.     losartan (COZAAR) 25 MG tablet Take 25 mg by mouth every evening.     memantine (NAMENDA) 10 MG tablet TAKE 1 TABLET(10 MG) BY MOUTH EVERY EVENING 90 tablet 0   Microlet Lancets MISC TEST DAILY AS DIRECTED 100 each 3   nitroGLYCERIN (NITROSTAT) 0.4 MG SL tablet Place 1 tablet (0.4 mg total) under the tongue every 5 (five) minutes as needed for chest pain. 25 tablet 2   ondansetron (ZOFRAN) 4 MG tablet Take 1 tablet (4 mg total) by mouth every 6 (six) hours. 12 tablet 0   oxyCODONE (ROXICODONE) 5 MG immediate release tablet Take 1 tablet (5 mg total) by mouth every 6 (six) hours as needed for up to 10 doses for breakthrough pain. (Patient not taking: Reported on 10/02/2021) 10 tablet 0   oxyCODONE-acetaminophen (PERCOCET/ROXICET) 5-325 MG tablet Take 1 tablet by mouth every 6 (six) hours as needed for severe pain. (Patient not taking: Reported on 10/02/2021) 15 tablet 0   QUEtiapine (SEROQUEL) 200 MG tablet TAKE 1 TAB PO q 5 pm  and 1 tab po QHS 180 tablet 0   rifaximin (XIFAXAN) 200 MG tablet Take 200 mg by mouth 3 (three) times daily. (Patient not taking: Reported on 11/04/2021)     rOPINIRole (REQUIP) 0.5 MG tablet Takes 4 tabs at 5 pm and 3 tabs at bedtime and 1-2 tabs prn 720 tablet 0   tiZANidine (ZANAFLEX) 4 MG tablet Take 1 tablet (4 mg total) by mouth every 6 (six) hours as needed for muscle spasms. (Patient not taking: Reported on 10/02/2021) 40 tablet 0   vancomycin (VANCOCIN) 125 MG capsule Take 125 mg by mouth daily. (Patient not taking: Reported on 11/04/2021)       Scheduled Meds:  insulin aspart  0-15 Units Subcutaneous TID WC   insulin aspart  0-5 Units Subcutaneous QHS   Continuous Infusions:  sodium chloride 125 mL/hr at 11/09/21 0332   cefTRIAXone (ROCEPHIN)  IV     PRN Meds:.acetaminophen **OR** acetaminophen, fentaNYL (SUBLIMAZE) injection, ondansetron **OR** ondansetron (ZOFRAN) IV  Allergies:  Allergies  Allergen Reactions   Codeine Anxiety and Other (See Comments)    Hallucinations, tolerates oxycodone    Procaine Hcl Palpitations   Epinephrine     Other reaction(s): Other (See Comments) Makes heart race at the dentist Makes heart race at the dentist    Aspirin Nausea And Vomiting and Other (See Comments)    Reaction:  Burns pts stomach    Augmentin [Amoxicillin-Pot Clavulanate] Diarrhea   Benadryl [Diphenhydramine] Other (See Comments)    Per MD "inhibits potency of gabapentin, lithium etc"   Diflucan [Fluconazole] Other (See Comments)    Unknown reaction    Dilaudid [Hydromorphone Hcl] Other (See Comments)    Migraines and nightmares    Hydromorphone Other (See Comments)   Morphine Other (See Comments)    headache   Other Nausea And Vomiting and Other (See Comments)    Pt states that all -mycins cause N/V. (MACROLIDES)    Prednisone  Long term steroid problems with lithium and blood sugar.    Tramadol Hcl     Other reaction(s): felt weird   Sulfa Antibiotics Other  (See Comments)    Unknown reaction    Family History  Problem Relation Age of Onset   Drug abuse Mother    Alcohol abuse Mother    Alcohol abuse Father    Hypertension Father    CAD Brother    Hypertension Brother    Alcohol abuse Brother    Hypertension Brother    Hypertension Brother    Psoriasis Daughter    Arthritis Daughter        psoriatic arthritis    Alcohol abuse Grandchild     Social History:  reports that she has never smoked. She has never used smokeless tobacco. She reports that she does not currently use alcohol. She reports that she does not currently use drugs after having used the following drugs: Benzodiazepines. Frequency: 7.00 times per week.  ROS: A complete review of systems was performed.  All systems are negative except for pertinent findings as noted.  Physical Exam:  Vital signs in last 24 hours: Temp:  [97.7 F (36.5 C)-98 F (36.7 C)] 97.7 F (36.5 C) (09/03 0901) Pulse Rate:  [74-112] 76 (09/03 0901) Resp:  [16-20] 20 (09/03 0901) BP: (92-152)/(56-81) 144/66 (09/03 0901) SpO2:  [90 %-97 %] 97 % (09/03 0901) Weight:  [87.5 kg] 87.5 kg (09/02 2317) Constitutional:  Alert and oriented, No acute distress Cardiovascular: Regular rate and rhythm Respiratory: Normal respiratory effort, Lungs clear bilaterally GI: Abdomen is soft, nontender, nondistended, no abdominal masses GU: No CVA tenderness; foley draining clear yellow urine Neurologic: Grossly intact, no focal deficits Psychiatric: Normal mood and affect  Laboratory Data:  Recent Labs    11/09/21 0012  WBC 13.3*  HGB 13.8  HCT 42.7  PLT 203    Recent Labs    11/09/21 0012  NA 139  K 4.5  CL 103  GLUCOSE 208*  BUN 11  CALCIUM 10.5*  CREATININE 1.01*     Results for orders placed or performed during the hospital encounter of 11/08/21 (from the past 24 hour(s))  Urinalysis, Routine w reflex microscopic Urine, Clean Catch     Status: Abnormal   Collection Time: 11/08/21  11:29 PM  Result Value Ref Range   Color, Urine YELLOW YELLOW   APPearance HAZY (A) CLEAR   Specific Gravity, Urine 1.014 1.005 - 1.030   pH 6.5 5.0 - 8.0   Glucose, UA NEGATIVE NEGATIVE mg/dL   Hgb urine dipstick LARGE (A) NEGATIVE   Bilirubin Urine NEGATIVE NEGATIVE   Ketones, ur NEGATIVE NEGATIVE mg/dL   Protein, ur 100 (A) NEGATIVE mg/dL   Nitrite NEGATIVE NEGATIVE   Leukocytes,Ua LARGE (A) NEGATIVE   RBC / HPF >50 (H) 0 - 5 RBC/hpf   WBC, UA >50 (H) 0 - 5 WBC/hpf   Bacteria, UA FEW (A) NONE SEEN   WBC Clumps PRESENT    Non Squamous Epithelial 0-5 (A) NONE SEEN  Comprehensive metabolic panel     Status: Abnormal   Collection Time: 11/09/21 12:12 AM  Result Value Ref Range   Sodium 139 135 - 145 mmol/L   Potassium 4.5 3.5 - 5.1 mmol/L   Chloride 103 98 - 111 mmol/L   CO2 23 22 - 32 mmol/L   Glucose, Bld 208 (H) 70 - 99 mg/dL   BUN 11 8 - 23 mg/dL   Creatinine, Ser 1.01 (H) 0.44 - 1.00  mg/dL   Calcium 10.5 (H) 8.9 - 10.3 mg/dL   Total Protein 6.5 6.5 - 8.1 g/dL   Albumin 4.0 3.5 - 5.0 g/dL   AST 14 (L) 15 - 41 U/L   ALT 17 0 - 44 U/L   Alkaline Phosphatase 88 38 - 126 U/L   Total Bilirubin 0.3 0.3 - 1.2 mg/dL   GFR, Estimated 59 (L) >60 mL/min   Anion gap 13 5 - 15  Lipase, blood     Status: None   Collection Time: 11/09/21 12:12 AM  Result Value Ref Range   Lipase 48 11 - 51 U/L  CBC with Diff     Status: Abnormal   Collection Time: 11/09/21 12:12 AM  Result Value Ref Range   WBC 13.3 (H) 4.0 - 10.5 K/uL   RBC 4.63 3.87 - 5.11 MIL/uL   Hemoglobin 13.8 12.0 - 15.0 g/dL   HCT 42.7 36.0 - 46.0 %   MCV 92.2 80.0 - 100.0 fL   MCH 29.8 26.0 - 34.0 pg   MCHC 32.3 30.0 - 36.0 g/dL   RDW 13.8 11.5 - 15.5 %   Platelets 203 150 - 400 K/uL   nRBC 0.0 0.0 - 0.2 %   Neutrophils Relative % 72 %   Neutro Abs 9.6 (H) 1.7 - 7.7 K/uL   Lymphocytes Relative 19 %   Lymphs Abs 2.5 0.7 - 4.0 K/uL   Monocytes Relative 6 %   Monocytes Absolute 0.7 0.1 - 1.0 K/uL   Eosinophils  Relative 2 %   Eosinophils Absolute 0.3 0.0 - 0.5 K/uL   Basophils Relative 0 %   Basophils Absolute 0.1 0.0 - 0.1 K/uL   Immature Granulocytes 1 %   Abs Immature Granulocytes 0.06 0.00 - 0.07 K/uL   No results found for this or any previous visit (from the past 240 hour(s)).  Renal Function: Recent Labs    11/09/21 0012  CREATININE 1.01*   Estimated Creatinine Clearance: 49.9 mL/min (A) (by C-G formula based on SCr of 1.01 mg/dL (H)).  Radiologic Imaging: CT ABDOMEN PELVIS W CONTRAST  Result Date: 11/09/2021 CLINICAL DATA:  Right lower quadrant abdominal pain. EXAM: CT ABDOMEN AND PELVIS WITH CONTRAST TECHNIQUE: Multidetector CT imaging of the abdomen and pelvis was performed using the standard protocol following bolus administration of intravenous contrast. RADIATION DOSE REDUCTION: This exam was performed according to the departmental dose-optimization program which includes automated exposure control, adjustment of the mA and/or kV according to patient size and/or use of iterative reconstruction technique. CONTRAST:  160m OMNIPAQUE IOHEXOL 300 MG/ML  SOLN COMPARISON:  CT abdomen pelvis dated 09/20/2021. FINDINGS: Lower chest: Minimal bibasilar dependent atelectasis. The visualized lung bases are otherwise clear. No intra-abdominal free air or free fluid. Hepatobiliary: No focal liver abnormality is seen. No gallstones, gallbladder wall thickening, or biliary dilatation. Pancreas: Unremarkable. No pancreatic ductal dilatation or surrounding inflammatory changes. Spleen: Normal in size without focal abnormality. Adrenals/Urinary Tract: The adrenal glands unremarkable. There is mild left and moderate right hydronephrosis. There is a transition at the right ureteropelvic junction which may be related to UPJ stricture. A urothelial neoplasm is not excluded. This can be better evaluated with direct visualization or dedicated multiphasic CT urography. There is enhancement of the urothelium of the  right renal collecting system and ureter suspicious for UTI. Correlation with urinalysis recommended. The urinary bladder is grossly unremarkable. Stomach/Bowel: Postsurgical changes of partial sigmoid resection with anastomotic suture. Several scattered sigmoid diverticula without active inflammatory changes. There is moderate  stool throughout the colon. There is no bowel obstruction or active inflammation. The appendix is not visualized with certainty. No inflammatory changes identified in the right lower quadrant. Vascular/Lymphatic: Mild aortoiliac atherosclerotic disease. The IVC is unremarkable. No portal venous gas. There is no adenopathy. Reproductive: Hysterectomy.  No adnexal masses. Other: Small fat containing umbilical hernia. Musculoskeletal: Total right hip arthroplasty. Degenerative changes of the spine. No acute osseous pathology. IMPRESSION: 1. Moderate right hydronephrosis with a transition at the right ureteropelvic. Underlying urothelial stricture or lesion is not excluded. There is enhancement of the urothelium of the right renal collecting system and ureter suspicious for UTI. Correlation with urinalysis recommended. 2. Colonic diverticulosis. No bowel obstruction. 3. Aortic Atherosclerosis (ICD10-I70.0). Electronically Signed   By: Anner Crete M.D.   On: 11/09/2021 01:38    I independently reviewed the above imaging studies.  Impression/Recommendation: 76 yo F with R pyelonephritis in the setting of hydronephrosis; hydronephrosis previously worked up extensively and not the result of obstruction.  -No acute intervention indicated -can f/u with GU as outpatient -please call or reconsult with any further questions  Donald Pore 11/09/2021, 9:03 AM  [facility]  Pager: [phone]

## 2021-11-09 NOTE — ED Notes (Signed)
Handoff report given to [place] on 5E at [facility]

## 2021-11-09 NOTE — ED Notes (Signed)
Handoff report given to [facility] 

## 2021-11-09 NOTE — Plan of Care (Signed)
[facility] will assume care on arrival to accepting facility. Until arrival, care as per EDP. However, [facility] available 24/7 for questions and assistance.  Nursing staff, please page [facility] Admits and Consults ([phone]) as soon as the patient arrives to the hospital.   

## 2021-11-10 DIAGNOSIS — E039 Hypothyroidism, unspecified: Secondary | ICD-10-CM

## 2021-11-10 DIAGNOSIS — N10 Acute tubulo-interstitial nephritis: Secondary | ICD-10-CM | POA: Diagnosis not present

## 2021-11-10 DIAGNOSIS — E119 Type 2 diabetes mellitus without complications: Secondary | ICD-10-CM | POA: Diagnosis not present

## 2021-11-10 LAB — GLUCOSE, CAPILLARY
Glucose-Capillary: 121 mg/dL — ABNORMAL HIGH (ref 70–99)
Glucose-Capillary: 113 mg/dL — ABNORMAL HIGH (ref 70–99)
Glucose-Capillary: 159 mg/dL — ABNORMAL HIGH (ref 70–99)
Glucose-Capillary: 187 mg/dL — ABNORMAL HIGH (ref 70–99)

## 2021-11-10 LAB — CBC
HCT: 37.7 % (ref 36.0–46.0)
Hemoglobin: 11.8 g/dL — ABNORMAL LOW (ref 12.0–15.0)
MCH: 30.3 pg (ref 26.0–34.0)
MCHC: 31.3 g/dL (ref 30.0–36.0)
MCV: 96.9 fL (ref 80.0–100.0)
Platelets: 163 10*3/uL (ref 150–400)
RBC: 3.89 MIL/uL (ref 3.87–5.11)
RDW: 14 % (ref 11.5–15.5)
WBC: 8.6 10*3/uL (ref 4.0–10.5)
nRBC: 0 % (ref 0.0–0.2)

## 2021-11-10 LAB — COMPREHENSIVE METABOLIC PANEL
ALT: 14 U/L (ref 0–44)
AST: 16 U/L (ref 15–41)
Albumin: 2.8 g/dL — ABNORMAL LOW (ref 3.5–5.0)
Alkaline Phosphatase: 64 U/L (ref 38–126)
Anion gap: 5 (ref 5–15)
BUN: 11 mg/dL (ref 8–23)
CO2: 23 mmol/L (ref 22–32)
Calcium: 8.9 mg/dL (ref 8.9–10.3)
Chloride: 113 mmol/L — ABNORMAL HIGH (ref 98–111)
Creatinine, Ser: 0.79 mg/dL (ref 0.44–1.00)
GFR, Estimated: >60 mL/min (ref >60)
Glucose, Bld: 134 mg/dL — ABNORMAL HIGH (ref 70–99)
Potassium: 4.3 mmol/L (ref 3.5–5.1)
Sodium: 141 mmol/L (ref 135–145)
Total Bilirubin: 0.5 mg/dL (ref 0.3–1.2)
Total Protein: 5.4 g/dL — ABNORMAL LOW (ref 6.5–8.1)

## 2021-11-10 MED ORDER — ENOXAPARIN SODIUM 40 MG/0.4ML IJ SOSY
40.0000 mg | PREFILLED_SYRINGE | Freq: Every day | INTRAMUSCULAR | Status: DC
  Administered 2021-11-10 – 2021-11-12 (×3): 40 mg via SUBCUTANEOUS
  Filled 2021-11-10 (×3): qty 0.4

## 2021-11-10 NOTE — Progress Notes (Signed)
OT Cancellation Note  Patient Details Name: [name] [name] MRN: [id number] DOB: April 17, 1945   Cancelled Treatment:    Reason Eval/Treat Not Completed: OT screened, no needs identified, will sign off. Patient reports no new deficits in regards to ADLs and functional mobility. Also ambulated in hall with MT.  Denika Krone L Kaitlan Bin 11/10/2021, 3:17 PM

## 2021-11-10 NOTE — Progress Notes (Signed)
[facility] PROGRESS NOTE   [name] [name] HCW:[id number] DOB: 1945/07/06 DOA: 11/08/2021  PCP: [name], MD  Brief History/Interval Summary:  76 y.o. female with medical history significant of DM2, HTN, bipolar d/o. Presenting with flank pain and dysuria. Symptoms started 3 days prior to admission.  CT scan raised concern for pyelonephritis.  Patient was hospitalized for further management.  She does have a history of C. difficile and is status post fecal transplant.  Consultants: Urology  Procedures: None    Subjective/Interval History: Patient mentions that she continues to have some pain in the right flank area.  Feels slightly lightheaded.  Denies any shortness of breath or chest pain.  No vomiting.  Feels better compared to yesterday.     Assessment/Plan:  Acute pyelonephritis CT scan raised concern for right-sided hydronephrosis.  Patient was seen by urology.  This is apparently a longstanding issue for this patient.  She has had work-up done previously.  No further interventions planned at this time. Continue with ceftriaxone.  Follow-up on culture data.  Noted to be afebrile.  WBC improved this morning.  Renal function is normal.  Recent fecal transplant for recurrent C. difficile After discussions with ID patient was started on vancomycin twice a day which she will need for duration of the antibiotics followed by for 7 more days. No diarrhea at this time.  Diabetes mellitus type 2, controlled Continue SSI.  HbA1c 6.8.  Essential hypertension Blood pressures were borderline low at the time of admission.  Antihypertensives are on hold.  Blood pressure appears to have stabilized. Noted to be on losartan prior to admission which is currently on hold.  Bipolar disorder Continue home medications.  Noted to be on Depakote, Neurontin, lithium and Seroquel.  Hypothyroidism Continue home medications.  Noted to be on levothyroxine as well as  liothyronine.  Restless leg syndrome Continue with ropinirole.  History of dementia Continue with donepezil and Namenda.  Hyperlipidemia Continue with statin.  Chronic back pain Supposed to follow-up with orthopedics/back surgery.  Supposed to undergo surgery in the near future.  She has an appointment tomorrow with the surgeon.  I told her that she will most likely have to reschedule.  Obesity Estimated body mass index is 36.47 kg/m as calculated from the following:   Height as of this encounter: '5\' 1"'$  (1.549 m).   Weight as of this encounter: 87.5 kg.   DVT Prophylaxis: Initiate Lovenox Code Status: Full code Family Communication: Discussed with patient.  No family at bedside Disposition Plan: PT and OT evaluation.  Hopefully return home when improved  Status is: Inpatient Remains inpatient appropriate because: Acute pyelonephritis requiring IV antibiotics      Medications: Scheduled:  atorvastatin  40 mg Oral QPM   divalproex  500 mg Oral QHS   donepezil  10 mg Oral QHS   ezetimibe  10 mg Oral Daily   gabapentin  600 mg Oral TID   insulin aspart  0-15 Units Subcutaneous TID WC   insulin aspart  0-5 Units Subcutaneous QHS   insulin glargine-yfgn  68 Units Subcutaneous QHS   levothyroxine  75 mcg Oral QAC breakfast   liothyronine  5 mcg Oral Daily   lithium carbonate  450 mg Oral QHS   memantine  10 mg Oral QHS   QUEtiapine  200 mg Oral BID   rOPINIRole  2 mg Oral Q24H   And   rOPINIRole  1.5 mg Oral QHS   vancomycin  125 mg Oral BID  Continuous:  sodium chloride 75 mL/hr at 11/10/21 0951   cefTRIAXone (ROCEPHIN)  IV Stopped (11/09/21 2131)   ZDG:LOVFIEPPIRJJO **OR** acetaminophen, ALPRAZolam, dicyclomine, fluticasone, ketorolac, loratadine, ondansetron **OR** ondansetron (ZOFRAN) IV, mouth rinse, oxyCODONE, rOPINIRole, tiZANidine  Antibiotics: Anti-infectives (From admission, onward)    Start     Dose/Rate Route Frequency Ordered Stop   11/09/21 2200   cefTRIAXone (ROCEPHIN) 1 g in sodium chloride 0.9 % 100 mL IVPB        1 g 200 mL/hr over 30 Minutes Intravenous Every 24 hours 11/09/21 0836     11/09/21 1300  vancomycin (VANCOCIN) capsule 125 mg        125 mg Oral 2 times daily 11/09/21 1128     11/09/21 0200  cefTRIAXone (ROCEPHIN) 1 g in sodium chloride 0.9 % 100 mL IVPB        1 g 200 mL/hr over 30 Minutes Intravenous  Once 11/09/21 0145 11/09/21 0235       Objective:  Vital Signs  Vitals:   11/09/21 1405 11/09/21 1704 11/09/21 2040 11/10/21 0411  BP: 135/60 (!) 143/68 (!) 140/71 127/68  Pulse: 85 83 89 81  Resp: '20 20 17 20  '$ Temp: 97.8 F (36.6 C) 98.3 F (36.8 C) 98.1 F (36.7 C) 97.8 F (36.6 C)  TempSrc: Oral Oral    SpO2: 92% 93% 92% 96%  Weight:      Height:        Intake/Output Summary (Last 24 hours) at 11/10/2021 1052 Last data filed at 11/10/2021 0846 Gross per 24 hour  Intake 2964.6 ml  Output 2053 ml  Net 911.6 ml   Filed Weights   11/08/21 2317  Weight: 87.5 kg    General appearance: Awake alert.  In no distress Resp: Normal effort at rest.  Few crackles at the bases.  No wheezing or rhonchi. Cardio: S1-S2 is normal regular.  No S3-S4.  No rubs murmurs or bruit GI: Abdomen is soft.  Tenderness in the right flank area without any rebound rigidity or guarding.  No masses organomegaly. Extremities: No edema.  Decreased range of motion of the lower extremities Neurologic: Alert and oriented x3.  No focal neurological deficits.    Lab Results:  Data Reviewed: I have personally reviewed following labs and reports of the imaging studies  CBC: Recent Labs  Lab 11/09/21 0012 11/10/21 0426  WBC 13.3* 8.6  NEUTROABS 9.6*  --   HGB 13.8 11.8*  HCT 42.7 37.7  MCV 92.2 96.9  PLT 203 841    Basic Metabolic Panel: Recent Labs  Lab 11/09/21 0012 11/10/21 0426  NA 139 141  K 4.5 4.3  CL 103 113*  CO2 23 23  GLUCOSE 208* 134*  BUN 11 11  CREATININE 1.01* 0.79  CALCIUM 10.5* 8.9     GFR: Estimated Creatinine Clearance: 63 mL/min (by C-G formula based on SCr of 0.79 mg/dL).  Liver Function Tests: Recent Labs  Lab 11/09/21 0012 11/10/21 0426  AST 14* 16  ALT 17 14  ALKPHOS 88 64  BILITOT 0.3 0.5  PROT 6.5 5.4*  ALBUMIN 4.0 2.8*    Recent Labs  Lab 11/09/21 0012  LIPASE 48     CBG: Recent Labs  Lab 11/09/21 0945 11/09/21 1213 11/09/21 1642 11/09/21 2041 11/10/21 0748  GLUCAP 94 126* 108* 165* 121*     Radiology Studies: CT ABDOMEN PELVIS W CONTRAST  Result Date: 11/09/2021 CLINICAL DATA:  Right lower quadrant abdominal pain. EXAM: CT ABDOMEN AND PELVIS WITH CONTRAST  TECHNIQUE: Multidetector CT imaging of the abdomen and pelvis was performed using the standard protocol following bolus administration of intravenous contrast. RADIATION DOSE REDUCTION: This exam was performed according to the departmental dose-optimization program which includes automated exposure control, adjustment of the mA and/or kV according to patient size and/or use of iterative reconstruction technique. CONTRAST:  165m OMNIPAQUE IOHEXOL 300 MG/ML  SOLN COMPARISON:  CT abdomen pelvis dated 09/20/2021. FINDINGS: Lower chest: Minimal bibasilar dependent atelectasis. The visualized lung bases are otherwise clear. No intra-abdominal free air or free fluid. Hepatobiliary: No focal liver abnormality is seen. No gallstones, gallbladder wall thickening, or biliary dilatation. Pancreas: Unremarkable. No pancreatic ductal dilatation or surrounding inflammatory changes. Spleen: Normal in size without focal abnormality. Adrenals/Urinary Tract: The adrenal glands unremarkable. There is mild left and moderate right hydronephrosis. There is a transition at the right ureteropelvic junction which may be related to UPJ stricture. A urothelial neoplasm is not excluded. This can be better evaluated with direct visualization or dedicated multiphasic CT urography. There is enhancement of the urothelium of  the right renal collecting system and ureter suspicious for UTI. Correlation with urinalysis recommended. The urinary bladder is grossly unremarkable. Stomach/Bowel: Postsurgical changes of partial sigmoid resection with anastomotic suture. Several scattered sigmoid diverticula without active inflammatory changes. There is moderate stool throughout the colon. There is no bowel obstruction or active inflammation. The appendix is not visualized with certainty. No inflammatory changes identified in the right lower quadrant. Vascular/Lymphatic: Mild aortoiliac atherosclerotic disease. The IVC is unremarkable. No portal venous gas. There is no adenopathy. Reproductive: Hysterectomy.  No adnexal masses. Other: Small fat containing umbilical hernia. Musculoskeletal: Total right hip arthroplasty. Degenerative changes of the spine. No acute osseous pathology. IMPRESSION: 1. Moderate right hydronephrosis with a transition at the right ureteropelvic. Underlying urothelial stricture or lesion is not excluded. There is enhancement of the urothelium of the right renal collecting system and ureter suspicious for UTI. Correlation with urinalysis recommended. 2. Colonic diverticulosis. No bowel obstruction. 3. Aortic Atherosclerosis (ICD10-I70.0). Electronically Signed   By: AAnner CreteM.D.   On: 11/09/2021 01:38       LOS: 1 day   [facility] Pager on [email or web address]  11/10/2021, 10:52 AM

## 2021-11-10 NOTE — Progress Notes (Signed)
Mobility Specialist - Progress Note    11/10/21 1422  Mobility  Activity Ambulated with assistance in hallway  Level of Assistance Standby assist, set-up cues, supervision of patient - no hands on  Assistive Device Front wheel walker  Distance Ambulated (ft) 200 ft  Activity Response Tolerated well  $Mobility charge 1 Mobility   Pt was found in bed and agreeable to mobilize. Pt had no complaints during ambulation and at EOS returned back to bed with all necessities in reach.  Ferd Hibbs Mobility Specialist

## 2021-11-11 ENCOUNTER — Encounter: Payer: Self-pay | Admitting: Internal Medicine

## 2021-11-11 DIAGNOSIS — I10 Essential (primary) hypertension: Secondary | ICD-10-CM | POA: Diagnosis not present

## 2021-11-11 DIAGNOSIS — E039 Hypothyroidism, unspecified: Secondary | ICD-10-CM | POA: Diagnosis not present

## 2021-11-11 DIAGNOSIS — N10 Acute tubulo-interstitial nephritis: Secondary | ICD-10-CM | POA: Diagnosis not present

## 2021-11-11 LAB — CBC
HCT: 40.9 % (ref 36.0–46.0)
Hemoglobin: 12.6 g/dL (ref 12.0–15.0)
MCH: 29.5 pg (ref 26.0–34.0)
MCHC: 30.8 g/dL (ref 30.0–36.0)
MCV: 95.8 fL (ref 80.0–100.0)
Platelets: 172 10*3/uL (ref 150–400)
RBC: 4.27 MIL/uL (ref 3.87–5.11)
RDW: 13.8 % (ref 11.5–15.5)
WBC: 8.7 10*3/uL (ref 4.0–10.5)
nRBC: 0 % (ref 0.0–0.2)

## 2021-11-11 LAB — GLUCOSE, CAPILLARY
Glucose-Capillary: 164 mg/dL — ABNORMAL HIGH (ref 70–99)
Glucose-Capillary: 118 mg/dL — ABNORMAL HIGH (ref 70–99)
Glucose-Capillary: 113 mg/dL — ABNORMAL HIGH (ref 70–99)
Glucose-Capillary: 252 mg/dL — ABNORMAL HIGH (ref 70–99)

## 2021-11-11 LAB — BASIC METABOLIC PANEL
Anion gap: 8 (ref 5–15)
BUN: 11 mg/dL (ref 8–23)
CO2: 24 mmol/L (ref 22–32)
Calcium: 9.6 mg/dL (ref 8.9–10.3)
Chloride: 110 mmol/L (ref 98–111)
Creatinine, Ser: 0.79 mg/dL (ref 0.44–1.00)
GFR, Estimated: >60 mL/min (ref >60)
Glucose, Bld: 117 mg/dL — ABNORMAL HIGH (ref 70–99)
Potassium: 4.1 mmol/L (ref 3.5–5.1)
Sodium: 142 mmol/L (ref 135–145)

## 2021-11-11 LAB — URINE CULTURE: Culture: >=100000 — AB

## 2021-11-11 MED ORDER — LOPERAMIDE HCL 2 MG PO CAPS
4.0000 mg | ORAL_CAPSULE | Freq: Once | ORAL | Status: AC
Start: 2021-11-11 — End: 2021-11-11
  Administered 2021-11-11: 4 mg via ORAL
  Filled 2021-11-11: qty 2

## 2021-11-11 MED ORDER — LOPERAMIDE HCL 2 MG PO CAPS: 4.0000 mg | ORAL_CAPSULE | Freq: Three times a day (TID) | ORAL | Status: DC | PRN

## 2021-11-11 MED ORDER — CEPHALEXIN 500 MG PO CAPS
500.0000 mg | ORAL_CAPSULE | Freq: Four times a day (QID) | ORAL | Status: DC
  Administered 2021-11-11 – 2021-11-12 (×4): 500 mg via ORAL
  Filled 2021-11-11 (×4): qty 1

## 2021-11-11 NOTE — Progress Notes (Signed)
[facility] PROGRESS NOTE   [name] [name] AJO:[id number] DOB: [birth year] DOA: 11/08/2021  PCP: [name], MD  Brief History/Interval Summary:  76 y.o. female with medical history significant of DM2, HTN, bipolar d/o. Presenting with flank pain and dysuria. Symptoms started 3 days prior to admission.  CT scan raised concern for pyelonephritis.  Patient was hospitalized for further management.  She does have a history of C. difficile and is status post fecal transplant.  Consultants: Urology  Procedures: None    Subjective/Interval History: Patient mentions some abdominal cramps.  She had about 3-4 small quantity bowel movements yesterday which were loose.  Denies any nausea vomiting.  No abdominal pain per se.  Denies any shortness of breath or chest pain.  Overall she feels that she is improving.     Assessment/Plan:  Acute pyelonephritis/E. coli CT scan raised concern for right-sided hydronephrosis.  Patient was seen by urology.  This is apparently a longstanding issue for this patient.  She has had work-up done previously.  No further interventions planned at this time for the hydronephrosis. Patient was started on ceftriaxone for her pyelonephritis.  Urine culture growing E. coli.  Sensitivities reviewed.  Changed to cephalexin today.    Recent fecal transplant for recurrent C. difficile After discussions with ID patient was started on vancomycin twice a day which she will need for duration of the antibiotics followed by for 7 more days. Did experience some loose stools yesterday although they are small in amount.  Could be from antibiotics.  WBC is normal.  She is on vancomycin as mentioned.  She also mentions a history of IBS.  We will give her Imodium and see how she responds. Patient is followed by Dr. Therisa Doyne with [facility] gastroenterology  Diabetes mellitus type 2, controlled Continue SSI.  HbA1c 6.8.  Essential hypertension Blood pressures were borderline  low at the time of admission.  Antihypertensives are on hold.  Blood pressure appears to have stabilized. Noted to be on losartan prior to admission which is currently on hold.  Bipolar disorder Continue home medications.  Noted to be on Depakote, Neurontin, lithium and Seroquel.  Hypothyroidism Continue home medications.  Noted to be on levothyroxine as well as liothyronine.  Restless leg syndrome Continue with ropinirole.  History of dementia Continue with donepezil and Namenda.  Hyperlipidemia Continue with statin.  Chronic back pain Supposed to follow-up with orthopedics/back surgery.  Supposed to undergo surgery in the near future.  She had an appointment with her surgeon today but she will reschedule this.    Obesity Estimated body mass index is 36.47 kg/m as calculated from the following:   Height as of this encounter: '5\' 1"'$  (1.549 m).   Weight as of this encounter: 87.5 kg.   DVT Prophylaxis: Initiate Lovenox Code Status: Full code Family Communication: Discussed with patient.  No family at bedside Disposition Plan: PT and OT evaluation.  Hopefully return home when improved.  Anticipate discharge tomorrow.  Status is: Inpatient Remains inpatient appropriate because: Acute pyelonephritis requiring IV antibiotics      Medications: Scheduled:  atorvastatin  40 mg Oral QPM   divalproex  500 mg Oral QHS   donepezil  10 mg Oral QHS   enoxaparin (LOVENOX) injection  40 mg Subcutaneous Daily   ezetimibe  10 mg Oral Daily   gabapentin  600 mg Oral TID   insulin aspart  0-15 Units Subcutaneous TID WC   insulin aspart  0-5 Units Subcutaneous QHS   insulin  glargine-yfgn  68 Units Subcutaneous QHS   levothyroxine  75 mcg Oral QAC breakfast   liothyronine  5 mcg Oral Daily   lithium carbonate  450 mg Oral QHS   memantine  10 mg Oral QHS   QUEtiapine  200 mg Oral BID   rOPINIRole  2 mg Oral Q24H   And   rOPINIRole  1.5 mg Oral QHS   vancomycin  125 mg Oral BID    Continuous:  cefTRIAXone (ROCEPHIN)  IV Stopped (11/10/21 2259)   RFF:[id number] **OR** acetaminophen, ALPRAZolam, dicyclomine, fluticasone, ketorolac, loperamide, loratadine, ondansetron **OR** ondansetron (ZOFRAN) IV, mouth rinse, oxyCODONE, rOPINIRole, tiZANidine  Antibiotics: Anti-infectives (From admission, onward)    Start     Dose/Rate Route Frequency Ordered Stop   11/09/21 2200  cefTRIAXone (ROCEPHIN) 1 g in sodium chloride 0.9 % 100 mL IVPB        1 g 200 mL/hr over 30 Minutes Intravenous Every 24 hours 11/09/21 0836     11/09/21 1300  vancomycin (VANCOCIN) capsule 125 mg        125 mg Oral 2 times daily 11/09/21 1128     11/09/21 0200  cefTRIAXone (ROCEPHIN) 1 g in sodium chloride 0.9 % 100 mL IVPB        1 g 200 mL/hr over 30 Minutes Intravenous  Once 11/09/21 0145 11/09/21 0235       Objective:  Vital Signs  Vitals:   11/10/21 0411 11/10/21 1501 11/10/21 2021 11/11/21 0447  BP: 127/68 (!) 146/80 (!) 154/67 139/68  Pulse: 81 78 83 77  Resp: '20 17 18 17  '$ Temp: 97.8 F (36.6 C) 98.9 F (37.2 C) 98.3 F (36.8 C) 98.1 F (36.7 C)  TempSrc:  Oral    SpO2: 96% 94% 94% 91%  Weight:      Height:        Intake/Output Summary (Last 24 hours) at 11/11/2021 0904 Last data filed at 11/11/2021 0856 Gross per 24 hour  Intake 660 ml  Output 1950 ml  Net -1290 ml    Filed Weights   11/08/21 2317  Weight: 87.5 kg    General appearance: Awake alert.  In no distress Resp: Clear to auscultation bilaterally.  Normal effort Cardio: S1-S2 is normal regular.  No S3-S4.  No rubs murmurs or bruit GI: Abdomen is soft.  Mildly tender diffusely without any rebound rigidity or guarding. Extremities: No edema.  Full range of motion of lower extremities. Neurologic: Alert and oriented x3.  No focal neurological deficits.    Lab Results:  Data Reviewed: I have personally reviewed following labs and reports of the imaging studies  CBC: Recent Labs  Lab 11/09/21 0012  11/10/21 0426 11/11/21 0442  WBC 13.3* 8.6 8.7  NEUTROABS 9.6*  --   --   HGB 13.8 11.8* 12.6  HCT 42.7 37.7 40.9  MCV 92.2 96.9 95.8  PLT 203 163 172     Basic Metabolic Panel: Recent Labs  Lab 11/09/21 0012 11/10/21 0426 11/11/21 0442  NA 139 141 142  K 4.5 4.3 4.1  CL 103 113* 110  CO2 '23 23 24  '$ GLUCOSE 208* 134* 117*  BUN '11 11 11  '$ CREATININE 1.01* 0.79 0.79  CALCIUM 10.5* 8.9 9.6     GFR: Estimated Creatinine Clearance: 63 mL/min (by C-G formula based on SCr of 0.79 mg/dL).  Liver Function Tests: Recent Labs  Lab 11/09/21 0012 11/10/21 0426  AST 14* 16  ALT 17 14  ALKPHOS 88 64  BILITOT 0.3 0.5  PROT 6.5 5.4*  ALBUMIN 4.0 2.8*     Recent Labs  Lab 11/09/21 0012  LIPASE 48      CBG: Recent Labs  Lab 11/10/21 0748 11/10/21 1201 11/10/21 1639 11/10/21 2023 11/11/21 0829  GLUCAP 121* 113* 159* 187* 164*      Radiology Studies: No results found.     LOS: 2 days   [facility] [facility] Pager on [email or web address]  11/11/2021, 9:04 AM

## 2021-11-11 NOTE — Progress Notes (Signed)
Mobility Specialist - Progress Note   11/11/21 1511  Mobility  HOB Elevated/Bed Position Self regulated  Activity Ambulated with assistance in hallway  Range of Motion/Exercises Active  Level of Assistance Modified independent, requires aide device or extra time  Assistive Device Front wheel walker  Distance Ambulated (ft) 250 ft  Activity Response Tolerated well  Transport method Ambulatory  $Mobility charge 1 Mobility   Pt received in bed and agreeable to mobility. Pt to bed after session with all needs met.      [facility]  Mobility Specialist  

## 2021-11-11 NOTE — Progress Notes (Signed)
  Transition of Care [facility]) Screening Note   Patient Details  Name: AMANADA PHILBRICK Date of Birth: November 23, 1945   [facility] of [facility]) CM/SW Contact:    Vassie Moselle, LCSW Phone Number: 11/11/2021, 2:19 PM    Transition of Care Department [facility]) has reviewed patient and no TOC needs have been identified at this time. We will continue to monitor patient advancement through interdisciplinary progression rounds. If new patient transition needs arise, please place a TOC consult.

## 2021-11-11 NOTE — Evaluation (Signed)
Physical Therapy Evaluation Patient Details Name: [name] [name] MRN: [id number] DOB: Dec 14, 1945 Today's Date: 11/11/2021  History of Present Illness  Patient is 76 y.o. female Presenting with flank pain and dysuria. Symptoms started 3 days prior to admission.  CT scan raised concern for pyelonephritis.  Patient was hospitalized for further management. PMH significant for DM2, HTN, bipolar disorder, fibromyalgia, HLD, IBS, lupus, ETOH abuse, Rt THA, Lt TKA, colon resection in 05/2021.    Clinical Impression  [name] [name] is 76 y.o. female admitted with above HPI and diagnosis. Patient is currently limited by functional impairments below (see PT problem list). Patient lives with her spouse and is independent at baseline. Currently she requires min guard/supervision for transfers and gait. Patient will benefit from continued skilled PT interventions to address impairments and progress independence with mobility, anticipate no follow up PT needs once medically ready to discharge home. Acute PT will follow and progress as able.        Recommendations for follow up therapy are one component of a multi-disciplinary discharge planning process, led by the attending physician.  Recommendations may be updated based on patient status, additional functional criteria and insurance authorization.  Follow Up Recommendations No PT follow up      Assistance Recommended at Discharge    Patient can return home with the following  A little help with walking and/or transfers;Assist for transportation;Help with stairs or ramp for entrance;Assistance with cooking/housework;A little help with bathing/dressing/bathroom    Equipment Recommendations None recommended by PT  Recommendations for Other Services       Functional Status Assessment Patient has had a recent decline in their functional status and demonstrates the ability to make significant improvements in function in a reasonable and predictable  amount of time.     Precautions / Restrictions Precautions Precautions: Fall Restrictions Weight Bearing Restrictions: No      Mobility  Bed Mobility Overal bed mobility: Needs Assistance Bed Mobility: Supine to Sit     Supine to sit: Supervision, HOB elevated     General bed mobility comments: extra time and supervision    Transfers Overall transfer level: Needs assistance Equipment used: None Transfers: Sit to/from Stand Sit to Stand: Supervision           General transfer comment: sup for safety, no cues or assist    Ambulation/Gait Ambulation/Gait assistance: Min guard, Supervision Gait Distance (Feet): 215 Feet Assistive device: Straight cane Gait Pattern/deviations: Step-through pattern, Decreased stride length Gait velocity: decr/fair     General Gait Details: pt slightly unsteady at start of gait but balance improved and pt demonstrated safe sequencing of SPC for mobility. VSS with HR in 100's and SpO2 92% or better.  Stairs            Wheelchair Mobility    Modified Rankin (Stroke Patients Only)       Balance Overall balance assessment: Mild deficits observed, not formally tested                                           Pertinent Vitals/Pain Pain Assessment Pain Assessment: No/denies pain    Home Living Family/patient expects to be discharged to:: Private residence Living Arrangements: Spouse/significant other Available Help at Discharge: Family Type of Home: House Home Access: Stairs to enter Entrance Stairs-Rails: Right Entrance Stairs-Number of Steps: 3 Alternate Level Stairs-Number of Steps: 10 Home Layout: Two  level;Full bath on main level;Able to live on main level with bedroom/bathroom Home Equipment: Rolling Walker (2 wheels);Cane - single point      Prior Function Prior Level of Function : Independent/Modified Independent             Mobility Comments: uses SPC for mobility       Hand  Dominance        Extremity/Trunk Assessment   Upper Extremity Assessment Upper Extremity Assessment: Overall WFL for tasks assessed    Lower Extremity Assessment Lower Extremity Assessment: Overall WFL for tasks assessed    Cervical / Trunk Assessment Cervical / Trunk Assessment: Normal  Communication   Communication: No difficulties  Cognition Arousal/Alertness: Awake/alert Behavior During Therapy: WFL for tasks assessed/performed Overall Cognitive Status: Within Functional Limits for tasks assessed                                          General Comments      Exercises     Assessment/Plan    PT Assessment Patient needs continued PT services  PT Problem List Decreased strength;Decreased activity tolerance;Decreased balance;Decreased mobility;Decreased knowledge of use of DME;Decreased knowledge of precautions       PT Treatment Interventions DME instruction;Gait training;Stair training;Functional mobility training;Therapeutic activities;Therapeutic exercise;Balance training;Patient/family education    PT Goals (Current goals can be found in the Care Plan section)  Acute Rehab PT Goals PT Goal Formulation: With patient Time For Goal Achievement: 11/25/21 Potential to Achieve Goals: Good    Frequency Min 3X/week     Co-evaluation               AM-PAC PT "6 Clicks" Mobility  Outcome Measure Help needed turning from your back to your side while in a flat bed without using bedrails?: A Little Help needed moving from lying on your back to sitting on the side of a flat bed without using bedrails?: A Little Help needed moving to and from a bed to a chair (including a wheelchair)?: A Little Help needed standing up from a chair using your arms (e.g., wheelchair or bedside chair)?: A Little Help needed to walk in hospital room?: A Little Help needed climbing 3-5 steps with a railing? : A Little 6 Click Score: 18    End of Session Equipment  Utilized During Treatment: Gait belt Activity Tolerance: Patient tolerated treatment well Patient left: in chair;with call bell/phone within reach Nurse Communication: Mobility status PT Visit Diagnosis: Unsteadiness on feet (R26.81);Muscle weakness (generalized) (M62.81)    Time: 4132-4401 PT Time Calculation (min) (ACUTE ONLY): 15 min   Charges:   PT Evaluation $PT Eval Low Complexity: 1 Low          Verner Mould, DPT Acute Rehabilitation Services Office [phone] Pager [phone]  11/11/21 12:28 PM

## 2021-11-11 NOTE — Progress Notes (Signed)
Mobility Specialist Cancellation/Refusal Note:    11/11/21 1325  Mobility  Activity Refused mobility     Reason for Cancellation/Refusal: Pt declined mobility at this time. Pt tired from getting bath. Will check back as schedule permits.       [facility]

## 2021-11-11 NOTE — Plan of Care (Signed)

## 2021-11-12 ENCOUNTER — Other Ambulatory Visit (HOSPITAL_COMMUNITY): Payer: Self-pay

## 2021-11-12 DIAGNOSIS — F0390 Unspecified dementia without behavioral disturbance: Secondary | ICD-10-CM

## 2021-11-12 DIAGNOSIS — N1330 Unspecified hydronephrosis: Secondary | ICD-10-CM

## 2021-11-12 DIAGNOSIS — A498 Other bacterial infections of unspecified site: Secondary | ICD-10-CM

## 2021-11-12 DIAGNOSIS — N10 Acute tubulo-interstitial nephritis: Secondary | ICD-10-CM | POA: Diagnosis not present

## 2021-11-12 LAB — GLUCOSE, CAPILLARY
Glucose-Capillary: 125 mg/dL — ABNORMAL HIGH (ref 70–99)
Glucose-Capillary: 110 mg/dL — ABNORMAL HIGH (ref 70–99)

## 2021-11-12 LAB — CBC
HCT: 41.6 % (ref 36.0–46.0)
Hemoglobin: 13.1 g/dL (ref 12.0–15.0)
MCH: 30.2 pg (ref 26.0–34.0)
MCHC: 31.5 g/dL (ref 30.0–36.0)
MCV: 95.9 fL (ref 80.0–100.0)
Platelets: 193 10*3/uL (ref 150–400)
RBC: 4.34 MIL/uL (ref 3.87–5.11)
RDW: 13.9 % (ref 11.5–15.5)
WBC: 8.3 10*3/uL (ref 4.0–10.5)
nRBC: 0 % (ref 0.0–0.2)

## 2021-11-12 LAB — BASIC METABOLIC PANEL
Anion gap: 7 (ref 5–15)
BUN: 12 mg/dL (ref 8–23)
CO2: 26 mmol/L (ref 22–32)
Calcium: 9.6 mg/dL (ref 8.9–10.3)
Chloride: 106 mmol/L (ref 98–111)
Creatinine, Ser: 0.78 mg/dL (ref 0.44–1.00)
GFR, Estimated: >60 mL/min (ref >60)
Glucose, Bld: 135 mg/dL — ABNORMAL HIGH (ref 70–99)
Potassium: 4.1 mmol/L (ref 3.5–5.1)
Sodium: 139 mmol/L (ref 135–145)

## 2021-11-12 MED ORDER — LOPERAMIDE HCL 2 MG PO CAPS: 4.0000 mg | ORAL_CAPSULE | Freq: Three times a day (TID) | ORAL | 0 refills | Status: AC | PRN

## 2021-11-12 MED ORDER — CEPHALEXIN 500 MG PO CAPS
500.0000 mg | ORAL_CAPSULE | Freq: Three times a day (TID) | ORAL | 0 refills | Status: DC
  Filled 2021-11-12: qty 10, 4d supply, fill #0

## 2021-11-12 MED ORDER — VANCOMYCIN HCL 125 MG PO CAPS
125.0000 mg | ORAL_CAPSULE | Freq: Two times a day (BID) | ORAL | 0 refills | Status: DC
  Filled 2021-11-12: qty 21, 11d supply, fill #0

## 2021-11-12 NOTE — Progress Notes (Signed)
Mobility Specialist - Progress Note   11/12/21 1331  Mobility  Activity Ambulated with assistance in hallway  Level of Assistance Modified independent, requires aide device or extra time  Assistive Device Front wheel walker  Distance Ambulated (ft) 500 ft  Activity Response Tolerated well  $Mobility charge 1 Mobility   Pt received in bed and agreed to mobilize. No c/o pain nor discomfort during ambulation. Pt returned to bed with all needs met and call bell within reach.   Roderick Pee Mobility Specialist

## 2021-11-12 NOTE — Progress Notes (Signed)
Mobility Specialist - Progress Note   11/12/21 1036  Mobility  Activity Ambulated with assistance in hallway  Level of Assistance Modified independent, requires aide device or extra time  Assistive Device Front wheel walker  Distance Ambulated (ft) 300 ft  Activity Response Tolerated well  $Mobility charge 1 Mobility   PT received EOB and agreed for mobility. No c/o pain nor discomfort. Pt returned to EOB with all needs met and family in room.   Roderick Pee Mobility Specialist

## 2021-11-12 NOTE — Assessment & Plan Note (Addendum)
BMI 36 with hypertension and diabetes.

## 2021-11-12 NOTE — Hospital Course (Signed)
76 y.o. female with medical history significant of DM2, HTN, bipolar d/o. Presenting with flank pain and dysuria. Symptoms started 3 days prior to admission.  CT scan raised concern for pyelonephritis.  Patient was hospitalized for further management.  She does have a history of C. difficile and is status post fecal transplant.

## 2021-11-12 NOTE — Discharge Summary (Signed)
Physician Discharge Summary   Patient: [name] [name] MRN: [id number] DOB: 10-05-1945  Admit date:     11/08/2021  Discharge date: 11/12/21  Discharge Physician: Edwin Dada   PCP: [name] Pepper, MD     Recommendations at discharge:  Follow up with PCP Dr. Orland Mustard for pyelonephritis in 1 week Dr. Orland Mustard: If patient's right hydronephrosis has not been imaged with CT urogram in the past (or evaluated by Urology for concern for malignancy) please obtain CT urogram and refer to Urology Follow up with GI Dr. Therisa Doyne as needed for diarrhea     Discharge Diagnoses: Principal Problem:   Acute pyelonephritis Active Problems:   Recurrent Clostridioides difficile infection   Hydronephrosis   HLD (hyperlipidemia)   Low back pain   Bipolar 1 disorder ([name])   Restless leg syndrome   Hypothyroidism   Essential hypertension   Type 2 diabetes mellitus with hyperglycemia ([name])   Dementia without behavioral disturbance ([name])   Morbid obesity ([name])   Hypercalcemia      Hospital Course: [name] is a 76 y.o. F with hx recurrent CDiff and recent fecal transplant at [facility], DM, HTN, chronic right hydronephrosis, IBS and bipolar disorder who presented with right flank pain, dysuria for several days.  In the ER, CT showed concerns for infection of the right renal pelvis, and she was admitted on antibiotics.       * Acute pyelonephritis Treated with Rocephin.  Urine culture growing pan-sensitive E coli.  Discussed with ID and GI.  Patient started on oral vancomycin twice daily and narrowed to cephalexin as soon as possible.  Discharged to complete 7 days total cephalexin.   Recurrent Clostridioides difficile infection Had increase in soft stools in 48 hours prior to discharge, but no abdominal pain, fever, leukocytosis.  Hopefully she has simply antibiotic associated diarrhea, not developing Cdiff.  Recommended close follow up.  Hydronephrosis Patient reports she believes  this is chronic, and has been evaluated by Urology but is not sure.  It appears old on CT imaging.    No stones seen on imaging, recommend that she have Urogram if not done previously.  Morbid obesity ([name]) BMI 36 with hypertension and diabetes.            [facility] Controlled Substances Registry was reviewed for this patient prior to discharge.      Disposition: Home   DISCHARGE MEDICATION: Allergies as of 11/12/2021       Reactions   Codeine Anxiety, Other (See Comments)   Hallucinations, tolerates oxycodone    Procaine Hcl Palpitations   Epinephrine    Other reaction(s): Other (See Comments) Makes heart race at the dentist Makes heart race at the dentist   Aspirin Nausea And Vomiting, Other (See Comments)   Reaction:  Burns pts stomach    Augmentin [amoxicillin-pot Clavulanate] Diarrhea   Benadryl [diphenhydramine] Other (See Comments)   Per MD "inhibits potency of gabapentin, lithium etc"   Diflucan [fluconazole] Other (See Comments)   Unknown reaction   Dilaudid [hydromorphone Hcl] Other (See Comments)   Migraines and nightmares    Hydromorphone Other (See Comments)   Morphine Other (See Comments)   headache   Other Nausea And Vomiting, Other (See Comments)   Pt states that all -mycins cause N/V. (MACROLIDES)    Prednisone    Long term steroid problems with lithium and blood sugar.    Tramadol Hcl    Other reaction(s): felt weird   Sulfa Antibiotics Other (See Comments)  Unknown reaction        Medication List     STOP taking these medications    rifaximin 200 MG tablet Commonly known as: XIFAXAN       TAKE these medications    acetaminophen 325 MG tablet Commonly known as: Tylenol Take 2 tablets (650 mg total) by mouth every 6 (six) hours as needed. What changed: reasons to take this   ALPRAZolam 0.5 MG tablet Commonly known as: XANAX Take 1 tablet (0.5 mg total) by mouth 4 (four) times daily as needed for anxiety. Start  taking on: December 02, 2021   atorvastatin 40 MG tablet Commonly known as: LIPITOR Take 40 mg by mouth every evening.   b complex vitamins capsule Take 1 capsule by mouth daily.   BD Pen Needle Nano 2nd Gen 32G X 4 MM Misc Generic drug: Insulin Pen Needle USE TO INJECT AS DIRECTED FIVE TIMES DAILY   BENEFIBER PO Take 1 Dose by mouth daily.   bismuth subsalicylate 76 YO/37CH suspension Commonly known as: PEPTO BISMOL Take 30 mLs by mouth every 6 (six) hours as needed.   cephALEXin 500 MG capsule Commonly known as: KEFLEX Take 1 capsule (500 mg total) by mouth 3 (three) times daily.   cholecalciferol 25 MCG (1000 UNIT) tablet Commonly known as: VITAMIN D3 Take 1,000 Units by mouth daily. Also takes 2,000 units capsule at bedtime   Contour Next Test test strip Generic drug: glucose blood USE TO TEST BLOOD SUGAR TWICE DAILY   dicyclomine 20 MG tablet Commonly known as: BENTYL Take 1 tablet (20 mg total) by mouth 3 (three) times daily as needed for spasms.   divalproex 250 MG DR tablet Commonly known as: DEPAKOTE Take 2 tablets (500 mg total) by mouth at bedtime.   donepezil 10 MG tablet Commonly known as: ARICEPT Take 1 tablet (10 mg total) by mouth at bedtime.   ezetimibe 10 MG tablet Commonly known as: Zetia Take 1 tablet (10 mg total) by mouth daily.   fluticasone 50 MCG/ACT nasal spray Commonly known as: FLONASE Place into both nostrils as needed for allergies or rhinitis.   gabapentin 600 MG tablet Commonly known as: NEURONTIN Take 600 mg by mouth 3 (three) times daily.   insulin lispro 100 UNIT/ML KwikPen Commonly known as: HumaLOG KwikPen ADMINISTER 10 UNITS UNDER THE SKIN BEFORE MEALS What changed:  how much to take how to take this when to take this reasons to take this additional instructions   Lantus SoloStar 100 UNIT/ML Solostar Pen Generic drug: insulin glargine ADMINISTER 66 UNITS UNDER THE SKIN AT BEDTIME What changed: additional  instructions   levothyroxine 75 MCG tablet Commonly known as: SYNTHROID TAKE 1 TABLET BY MOUTH EVERY DAY BEFORE BREAKFAST What changed: See the new instructions.   liothyronine 5 MCG tablet Commonly known as: CYTOMEL TAKE 1 TABLET BY MOUTH DAILY WITH LEVOTHYROXINE. TAKE BEFORE BREAKFAST What changed: See the new instructions.   lithium carbonate 450 MG CR tablet Commonly known as: ESKALITH Take 1 tablet (450 mg total) by mouth at bedtime.   loperamide 2 MG capsule Commonly known as: IMODIUM Take 2 capsules (4 mg total) by mouth 3 (three) times daily as needed for diarrhea or loose stools.   loratadine 10 MG tablet Commonly known as: CLARITIN Take 10 mg by mouth daily as needed for allergies.   losartan 25 MG tablet Commonly known as: COZAAR Take 25 mg by mouth every evening.   memantine 10 MG tablet Commonly known as: NAMENDA TAKE  1 TABLET(10 MG) BY MOUTH EVERY EVENING What changed:  how much to take how to take this when to take this   Microlet Lancets Misc TEST DAILY AS DIRECTED   nitroGLYCERIN 0.4 MG SL tablet Commonly known as: NITROSTAT Place 1 tablet (0.4 mg total) under the tongue every 5 (five) minutes as needed for chest pain.   ondansetron 4 MG tablet Commonly known as: ZOFRAN Take 1 tablet (4 mg total) by mouth every 6 (six) hours.   oxyCODONE 5 MG immediate release tablet Commonly known as: Roxicodone Take 1 tablet (5 mg total) by mouth every 6 (six) hours as needed for up to 10 doses for breakthrough pain.   oxyCODONE-acetaminophen 5-325 MG tablet Commonly known as: PERCOCET/ROXICET Take 1 tablet by mouth every 6 (six) hours as needed for severe pain.   PEG-3350/Electrolytes 236 g Solr Take 17 g by mouth daily as needed.   QUEtiapine 200 MG tablet Commonly known as: SEROQUEL TAKE 1 TAB PO q 5 pm and 1 tab po QHS What changed:  how much to take how to take this   rOPINIRole 0.5 MG tablet Commonly known as: REQUIP Takes 4 tabs at 5 pm and  3 tabs at bedtime and 1-2 tabs prn   tiZANidine 4 MG tablet Commonly known as: Zanaflex Take 1 tablet (4 mg total) by mouth every 6 (six) hours as needed for muscle spasms.   vancomycin 125 MG capsule Commonly known as: VANCOCIN Take 1 capsule (125 mg total) by mouth 2 (two) times daily. What changed: when to take this Notes to patient: tonight        Follow-up Information     [name] Pepper, MD. Schedule an appointment as soon as possible for a visit in 1 week(s).   Specialty: Family Medicine Contact information: [place] [address] [phone]         Ronnette Juniper, MD. Call.   Specialty: Gastroenterology Contact information: [place] [address] [phone]                 Discharge Instructions     Discharge instructions   Complete by: As directed    From Dr. Loleta Books: You were admitted with a kidney infection Here, you had a CT scan that showed some inflammation of the right kidney consistent with kidney infection.   Your urine culture was growing E coli, which is common.  You should finish antibiotics by taking cephalexin 500 mg three times daily for the last 3 days of a 7 day course Take tonight, then three times daily until Sunday  To prevent Cdiff, take vancomycin 125 mg twice daily for the rest of this week and then for 7 days after finishing the cephalexin  You may have soft loose stools because of the antibiotics, but if you have frequent watery stools, more than 6 stools a day, with worsening cramps and fever, call your primary care for a same day or next day appointment or come back to the ER  Important, you have chronic enlargement of the ureter on the right (the urine tube from the kidney to the bladder) This was on prior imaging.  If you have not had this imaged specifically before, ask your primary care doctor Dr. Orland Mustard to get a CT urogram   Increase activity slowly   Complete  by: As directed        Discharge Exam: Filed Weights   11/08/21 2317  Weight: 87.5  kg    General: Pt is alert, awake, not in acute distress Cardiovascular: RRR, nl S1-S2, no murmurs appreciated.   No LE edema.   Respiratory: Normal respiratory rate and rhythm.  CTAB without rales or wheezes. Abdominal: Abdomen soft and non-tender.  No distension or HSM.   Neuro/Psych: Strength symmetric in upper and lower extremities.  Judgment and insight appear normal.   Condition at discharge: good  The results of significant diagnostics from this hospitalization (including imaging, microbiology, ancillary and laboratory) are listed below for reference.   Imaging Studies: CT ABDOMEN PELVIS W CONTRAST  Result Date: 11/09/2021 CLINICAL DATA:  Right lower quadrant abdominal pain. EXAM: CT ABDOMEN AND PELVIS WITH CONTRAST TECHNIQUE: Multidetector CT imaging of the abdomen and pelvis was performed using the standard protocol following bolus administration of intravenous contrast. RADIATION DOSE REDUCTION: This exam was performed according to the departmental dose-optimization program which includes automated exposure control, adjustment of the mA and/or kV according to patient size and/or use of iterative reconstruction technique. CONTRAST:  15m OMNIPAQUE IOHEXOL 300 MG/ML  SOLN COMPARISON:  CT abdomen pelvis dated 09/20/2021. FINDINGS: Lower chest: Minimal bibasilar dependent atelectasis. The visualized lung bases are otherwise clear. No intra-abdominal free air or free fluid. Hepatobiliary: No focal liver abnormality is seen. No gallstones, gallbladder wall thickening, or biliary dilatation. Pancreas: Unremarkable. No pancreatic ductal dilatation or surrounding inflammatory changes. Spleen: Normal in size without focal abnormality. Adrenals/Urinary Tract: The adrenal glands unremarkable. There is mild left and moderate right hydronephrosis. There is a transition at the right ureteropelvic junction which  may be related to UPJ stricture. A urothelial neoplasm is not excluded. This can be better evaluated with direct visualization or dedicated multiphasic CT urography. There is enhancement of the urothelium of the right renal collecting system and ureter suspicious for UTI. Correlation with urinalysis recommended. The urinary bladder is grossly unremarkable. Stomach/Bowel: Postsurgical changes of partial sigmoid resection with anastomotic suture. Several scattered sigmoid diverticula without active inflammatory changes. There is moderate stool throughout the colon. There is no bowel obstruction or active inflammation. The appendix is not visualized with certainty. No inflammatory changes identified in the right lower quadrant. Vascular/Lymphatic: Mild aortoiliac atherosclerotic disease. The IVC is unremarkable. No portal venous gas. There is no adenopathy. Reproductive: Hysterectomy.  No adnexal masses. Other: Small fat containing umbilical hernia. Musculoskeletal: Total right hip arthroplasty. Degenerative changes of the spine. No acute osseous pathology. IMPRESSION: 1. Moderate right hydronephrosis with a transition at the right ureteropelvic. Underlying urothelial stricture or lesion is not excluded. There is enhancement of the urothelium of the right renal collecting system and ureter suspicious for UTI. Correlation with urinalysis recommended. 2. Colonic diverticulosis. No bowel obstruction. 3. Aortic Atherosclerosis (ICD10-I70.0). Electronically Signed   By: AAnner CreteM.D.   On: 11/09/2021 01:38    Microbiology: Results for orders placed or performed during the hospital encounter of 11/08/21  Urine Culture     Status: Abnormal   Collection Time: 11/08/21 11:29 PM   Specimen: Urine, Clean Catch  Result Value Ref Range Status   Specimen Description   Final    URINE, CLEAN CATCH Performed at [facility], [address] [place] [address]   Special Requests   Final     NONE [facility]formed at [facility], [address] [place] [address]   Culture >=100,000 COLONIES/mL ESCHERICHIA COLI (A)  Final   Report Status 11/11/2021 FINAL  Final   Organism ID, Bacteria ESCHERICHIA COLI (A)  Final  Susceptibility   Escherichia coli - MIC*    AMPICILLIN >=32 RESISTANT Resistant     CEFAZOLIN <=4 SENSITIVE Sensitive     CEFEPIME <=0.12 SENSITIVE Sensitive     CEFTRIAXONE <=0.25 SENSITIVE Sensitive     CIPROFLOXACIN <=0.25 SENSITIVE Sensitive     GENTAMICIN <=1 SENSITIVE Sensitive     IMIPENEM <=0.25 SENSITIVE Sensitive     NITROFURANTOIN <=16 SENSITIVE Sensitive     TRIMETH/SULFA >=320 RESISTANT Resistant     AMPICILLIN/SULBACTAM 16 INTERMEDIATE Intermediate     PIP/TAZO <=4 SENSITIVE Sensitive     * >=100,000 COLONIES/mL ESCHERICHIA COLI    Labs: CBC: Recent Labs  Lab 11/09/21 0012 11/10/21 0426 11/11/21 0442 11/12/21 0525  WBC 13.3* 8.6 8.7 8.3  NEUTROABS 9.6*  --   --   --   HGB 13.8 11.8* 12.6 13.1  HCT 42.7 37.7 40.9 41.6  MCV 92.2 96.9 95.8 95.9  PLT 203 163 172 269   Basic Metabolic Panel: Recent Labs  Lab 11/09/21 0012 11/10/21 0426 11/11/21 0442 11/12/21 0525  NA 139 141 142 139  K 4.5 4.3 4.1 4.1  CL 103 113* 110 106  CO2 '23 23 24 26  '$ GLUCOSE 208* 134* 117* 135*  BUN '11 11 11 12  '$ CREATININE 1.01* 0.79 0.79 0.78  CALCIUM 10.5* 8.9 9.6 9.6   Liver Function Tests: Recent Labs  Lab 11/09/21 0012 11/10/21 0426  AST 14* 16  ALT 17 14  ALKPHOS 88 64  BILITOT 0.3 0.5  PROT 6.5 5.4*  ALBUMIN 4.0 2.8*   CBG: Recent Labs  Lab 11/11/21 1207 11/11/21 1630 11/11/21 2016 11/12/21 0749 11/12/21 1146  GLUCAP 118* 113* 252* 125* 110*    Discharge time spent: approximately 35 minutes spent on discharge counseling, evaluation of patient on day of discharge, and coordination of discharge planning with nursing, social work, pharmacy and case management  Signed: Edwin Dada, MD [facility] 11/12/2021

## 2021-11-17 ENCOUNTER — Telehealth: Payer: Self-pay | Admitting: Psychiatry

## 2021-11-17 DIAGNOSIS — Z03818 Encounter for observation for suspected exposure to other biological agents ruled out: Secondary | ICD-10-CM | POA: Diagnosis not present

## 2021-11-17 DIAGNOSIS — Z79899 Other long term (current) drug therapy: Secondary | ICD-10-CM

## 2021-11-17 DIAGNOSIS — Z8719 Personal history of other diseases of the digestive system: Secondary | ICD-10-CM | POA: Diagnosis not present

## 2021-11-17 DIAGNOSIS — E559 Vitamin D deficiency, unspecified: Secondary | ICD-10-CM | POA: Diagnosis not present

## 2021-11-17 DIAGNOSIS — N10 Acute tubulo-interstitial nephritis: Secondary | ICD-10-CM | POA: Diagnosis not present

## 2021-11-17 DIAGNOSIS — R350 Frequency of micturition: Secondary | ICD-10-CM | POA: Diagnosis not present

## 2021-11-17 DIAGNOSIS — M797 Fibromyalgia: Secondary | ICD-10-CM | POA: Diagnosis not present

## 2021-11-17 DIAGNOSIS — Z09 Encounter for follow-up examination after completed treatment for conditions other than malignant neoplasm: Secondary | ICD-10-CM | POA: Diagnosis not present

## 2021-11-17 DIAGNOSIS — J029 Acute pharyngitis, unspecified: Secondary | ICD-10-CM | POA: Diagnosis not present

## 2021-11-17 DIAGNOSIS — N390 Urinary tract infection, site not specified: Secondary | ICD-10-CM | POA: Diagnosis not present

## 2021-11-17 NOTE — Telephone Encounter (Signed)
Please advise 

## 2021-11-17 NOTE — Telephone Encounter (Signed)
Please let her know that Toradol is an anti-inflammatory and can interact with Lithium like other NSAIDs (ibuprofen, Aleve, Meloxicam). It looks like she is no longer taking it. It is about time to re-check her lithium level and Valproic Acid Level anyway, so please let her know that lab orders were sent so we can go ahead and check it. Recommend she have labs drawn in the morning instead of later in the day to get a more accurate level.

## 2021-11-17 NOTE — Telephone Encounter (Signed)
Pt informed

## 2021-11-17 NOTE — Telephone Encounter (Signed)
Last appt was 11/04/21. [name] [name] called and just got out of the hospital for a kidney infection. While there, she got 3 shots of Toradol and put on powerful antibiotics. She was told that the Toradol might bother her Lithium level. She wants to know if any of her medications she takes from here will mess up her bipolar. Her phone number is [phone].

## 2021-11-18 DIAGNOSIS — Z79899 Other long term (current) drug therapy: Secondary | ICD-10-CM | POA: Diagnosis not present

## 2021-11-19 LAB — VALPROIC ACID LEVEL: Valproic Acid Lvl: 57 mg/L (ref 50.0–100.0)

## 2021-11-19 LAB — LITHIUM LEVEL: Lithium Lvl: 0.7 mmol/L (ref 0.6–1.2)

## 2021-11-20 DIAGNOSIS — N3000 Acute cystitis without hematuria: Secondary | ICD-10-CM | POA: Diagnosis not present

## 2021-11-20 DIAGNOSIS — N3946 Mixed incontinence: Secondary | ICD-10-CM | POA: Diagnosis not present

## 2021-11-20 DIAGNOSIS — N1330 Unspecified hydronephrosis: Secondary | ICD-10-CM | POA: Diagnosis not present

## 2021-11-21 ENCOUNTER — Telehealth: Payer: Self-pay | Admitting: *Deleted

## 2021-11-21 ENCOUNTER — Encounter: Payer: Self-pay | Admitting: *Deleted

## 2021-11-21 NOTE — Patient Instructions (Signed)
Visit Information  Thank you for taking time to visit with me today. Please don't hesitate to contact me if I can be of assistance to you.   Following are the goals we discussed today:   Goals Addressed               This Visit's Progress     COMPLETED: No needs (pt-stated)        Care Coordination Interventions: Advised patient to contact her primary provider's office to scheduled her AWV for 2023 (receptive) Provided education to patient and/or caregiver about advanced directives Reviewed medications with patient and discussed adherence to all her medication and educated accordingly. Reviewed scheduled/upcoming provider appointments including pending appointments Screening for signs and symptoms of depression related to chronic disease state  Assessed social determinant of health barriers         Please call the care guide team at [phone] if you need to cancel or reschedule your appointment.   If you are experiencing a Mental Health or [place] or need someone to talk to, please call the Suicide and Crisis Lifeline: 988  Patient verbalizes understanding of instructions and care plan provided today and agrees to view in [place]. Active MyChart status and patient understanding of how to access instructions and care plan via MyChart confirmed with patient.     No further follow up required: No needs  Raina Mina, RN Care Management Coordinator [place] Office [phone]

## 2021-11-21 NOTE — Telephone Encounter (Signed)
Patient is scheduled for 11/26/2021 at 9:15

## 2021-11-21 NOTE — Patient Outreach (Signed)
  Care Coordination   Initial Visit Note   11/21/2021 Name: KISA FUJII MRN: [id number] DOB: 28-May-1945  ARDIS LAWLEY is a 76 y.o. year old female who sees London Pepper, MD for primary care. I spoke with  [name] by phone today.  What matters to the patients health and wellness today?  No needs    Goals Addressed               This Visit's Progress     COMPLETED: No needs (pt-stated)        Care Coordination Interventions: Advised patient to contact her primary provider's office to scheduled her AWV for 2023 (receptive) Provided education to patient and/or caregiver about advanced directives Reviewed medications with patient and discussed adherence to all her medication and educated accordingly. Reviewed scheduled/upcoming provider appointments including pending appointments Screening for signs and symptoms of depression related to chronic disease state  Assessed social determinant of health barriers         SDOH assessments and interventions completed:  Yes  SDOH Interventions Today    Flowsheet Row Most Recent Value  SDOH Interventions   Food Insecurity Interventions Intervention Not Indicated  Housing Interventions Intervention Not Indicated  Transportation Interventions Intervention Not Indicated        Care Coordination Interventions Activated:  Yes  Care Coordination Interventions:  Yes, provided   Follow up plan: No further intervention required.   Encounter Outcome:  Pt. Visit Completed   Raina Mina, RN Care Management Coordinator [place] Office [phone]

## 2021-11-26 ENCOUNTER — Encounter: Payer: Self-pay | Admitting: Endocrinology

## 2021-11-26 ENCOUNTER — Emergency Department (HOSPITAL_BASED_OUTPATIENT_CLINIC_OR_DEPARTMENT_OTHER): Payer: Medicare Other

## 2021-11-26 ENCOUNTER — Encounter (HOSPITAL_BASED_OUTPATIENT_CLINIC_OR_DEPARTMENT_OTHER): Payer: Self-pay | Admitting: Obstetrics and Gynecology

## 2021-11-26 ENCOUNTER — Ambulatory Visit (INDEPENDENT_AMBULATORY_CARE_PROVIDER_SITE_OTHER): Payer: Medicare Other | Admitting: Endocrinology

## 2021-11-26 ENCOUNTER — Emergency Department (HOSPITAL_BASED_OUTPATIENT_CLINIC_OR_DEPARTMENT_OTHER)
Admission: EM | Admit: 2021-11-26 | Discharge: 2021-11-26 | Disposition: A | Discharge status: Home / Self Care | Payer: Medicare Other | Attending: Emergency Medicine | Admitting: Emergency Medicine

## 2021-11-26 ENCOUNTER — Other Ambulatory Visit: Payer: Self-pay

## 2021-11-26 VITALS — BP 118/70 | HR 70 | Ht 61.0 in | Wt 188.0 lb

## 2021-11-26 DIAGNOSIS — S3992XA Unspecified injury of lower back, initial encounter: Secondary | ICD-10-CM | POA: Diagnosis present

## 2021-11-26 DIAGNOSIS — Y93E5 Activity, floor mopping and cleaning: Secondary | ICD-10-CM | POA: Diagnosis not present

## 2021-11-26 DIAGNOSIS — Z794 Long term (current) use of insulin: Secondary | ICD-10-CM | POA: Insufficient documentation

## 2021-11-26 DIAGNOSIS — R531 Weakness: Secondary | ICD-10-CM | POA: Diagnosis not present

## 2021-11-26 DIAGNOSIS — R911 Solitary pulmonary nodule: Secondary | ICD-10-CM | POA: Diagnosis not present

## 2021-11-26 DIAGNOSIS — S39012A Strain of muscle, fascia and tendon of lower back, initial encounter: Secondary | ICD-10-CM | POA: Diagnosis not present

## 2021-11-26 DIAGNOSIS — X500XXA Overexertion from strenuous movement or load, initial encounter: Secondary | ICD-10-CM | POA: Diagnosis not present

## 2021-11-26 DIAGNOSIS — E1165 Type 2 diabetes mellitus with hyperglycemia: Secondary | ICD-10-CM

## 2021-11-26 DIAGNOSIS — E782 Mixed hyperlipidemia: Secondary | ICD-10-CM

## 2021-11-26 DIAGNOSIS — I700 Atherosclerosis of aorta: Secondary | ICD-10-CM | POA: Diagnosis not present

## 2021-11-26 LAB — BASIC METABOLIC PANEL
Anion gap: 11 (ref 5–15)
BUN: 12 mg/dL (ref 8–23)
CO2: 26 mmol/L (ref 22–32)
Calcium: 10.5 mg/dL — ABNORMAL HIGH (ref 8.9–10.3)
Chloride: 103 mmol/L (ref 98–111)
Creatinine, Ser: 0.8 mg/dL (ref 0.44–1.00)
GFR, Estimated: >60 mL/min (ref >60)
Glucose, Bld: 178 mg/dL — ABNORMAL HIGH (ref 70–99)
Potassium: 4.4 mmol/L (ref 3.5–5.1)
Sodium: 140 mmol/L (ref 135–145)

## 2021-11-26 LAB — CBC WITH DIFFERENTIAL/PLATELET
Abs Immature Granulocytes: 0.08 10*3/uL — ABNORMAL HIGH (ref 0.00–0.07)
Basophils Absolute: 0 10*3/uL (ref 0.0–0.1)
Basophils Relative: 0 %
Eosinophils Absolute: 0.3 10*3/uL (ref 0.0–0.5)
Eosinophils Relative: 3 %
HCT: 43.2 % (ref 36.0–46.0)
Hemoglobin: 14 g/dL (ref 12.0–15.0)
Immature Granulocytes: 1 %
Lymphocytes Relative: 17 %
Lymphs Abs: 1.6 10*3/uL (ref 0.7–4.0)
MCH: 30.2 pg (ref 26.0–34.0)
MCHC: 32.4 g/dL (ref 30.0–36.0)
MCV: 93.1 fL (ref 80.0–100.0)
Monocytes Absolute: 0.5 10*3/uL (ref 0.1–1.0)
Monocytes Relative: 5 %
Neutro Abs: 6.9 10*3/uL (ref 1.7–7.7)
Neutrophils Relative %: 74 %
Platelets: 213 10*3/uL (ref 150–400)
RBC: 4.64 MIL/uL (ref 3.87–5.11)
RDW: 13.9 % (ref 11.5–15.5)
WBC: 9.3 10*3/uL (ref 4.0–10.5)
nRBC: 0 % (ref 0.0–0.2)

## 2021-11-26 LAB — URINALYSIS, ROUTINE W REFLEX MICROSCOPIC
Bilirubin Urine: NEGATIVE
Glucose, UA: NEGATIVE mg/dL
Hgb urine dipstick: NEGATIVE
Ketones, ur: NEGATIVE mg/dL
Nitrite: NEGATIVE
Protein, ur: NEGATIVE mg/dL
Specific Gravity, Urine: 1.015 (ref 1.005–1.030)
pH: 6 (ref 5.0–8.0)

## 2021-11-26 LAB — HSV 1/2 PCR (SURFACE)
HSV-1 DNA: NOT DETECTED
HSV-2 DNA: NOT DETECTED

## 2021-11-26 LAB — D-DIMER, QUANTITATIVE: D-Dimer, Quant: <0.27 ug/mL-FEU (ref 0.00–0.50)

## 2021-11-26 MED ORDER — OXYCODONE-ACETAMINOPHEN 5-325 MG PO TABS
1.0000 | ORAL_TABLET | Freq: Once | ORAL | Status: AC
  Administered 2021-11-26: 1 via ORAL
  Filled 2021-11-26: qty 1

## 2021-11-26 MED ORDER — KETOROLAC TROMETHAMINE 30 MG/ML IJ SOLN
15.0000 mg | Freq: Once | INTRAMUSCULAR | Status: AC
  Administered 2021-11-26: 15 mg via INTRAVENOUS
  Filled 2021-11-26: qty 1

## 2021-11-26 MED ORDER — NAPROXEN 250 MG PO TABS
500.0000 mg | ORAL_TABLET | Freq: Once | ORAL | Status: AC
  Administered 2021-11-26: 500 mg via ORAL
  Filled 2021-11-26: qty 2

## 2021-11-26 MED ORDER — METHOCARBAMOL 500 MG PO TABS: 500.0000 mg | ORAL_TABLET | Freq: Three times a day (TID) | ORAL | 0 refills | Status: DC | PRN

## 2021-11-26 MED ORDER — IOHEXOL 350 MG/ML SOLN
100.0000 mL | Freq: Once | INTRAVENOUS | Status: AC | PRN
  Administered 2021-11-26: 80 mL via INTRAVENOUS

## 2021-11-26 MED ORDER — METHOCARBAMOL 500 MG PO TABS
500.0000 mg | ORAL_TABLET | Freq: Once | ORAL | Status: AC
  Administered 2021-11-26: 500 mg via ORAL
  Filled 2021-11-26: qty 1

## 2021-11-26 MED ORDER — BYETTA 5 MCG PEN 5 MCG/0.02ML ~~LOC~~ SOPN: 5.0000 ug | PEN_INJECTOR | Freq: Two times a day (BID) | SUBCUTANEOUS | 1 refills | Status: DC

## 2021-11-26 NOTE — ED Triage Notes (Signed)
Patient reports she was cleaning out her closet and pulled her back due to bending and lifting. Patient denies any other injury.

## 2021-11-26 NOTE — ED Notes (Addendum)
Pt aware of need for urine, beverage provided to assist with this, call light in reach

## 2021-11-26 NOTE — Discharge Instructions (Addendum)
Your testing is reassuring.  No evidence of kidney stone or urinary tract infection.  Low suspicion for any kind of spinal cord injury.  Use anti-inflammatories and Tylenol as needed for pain and follow-up with your doctor.  Return to the ED with worsening pain, weakness, numbness or tingling, bowel or bladder incontinence or any other concerns

## 2021-11-26 NOTE — ED Provider Notes (Signed)
[facility] Provider Note   CSN: [id number] Arrival date & time: 11/26/21  1529     History {Add pertinent medical, surgical, social history, OB history to HPI:1} Chief Complaint  Patient presents with   Back Pain    [name] [name] is a 76 y.o. female.  Patient presents with right-sided low back pain that onset today.  States she was cleaning out closets yesterday and believes she injured her back but denies any specific injury.  The pain is in her right lumbar region and radiates down her right leg as well as up the side of her back.  She feels some weakness in her leg.  No numbness or tingling.  No bowel or bladder incontinence.  No fever, chills, nausea or vomiting.  No history of IV drug abuse or cancer.  Denies any loss of bowel or bladder control.  She has had back problems previously but never had back surgery. No blood thinner use.  Patient recently hospitalized for UTI and C. difficile and states she completed her courses of antibiotics and is no longer having diarrhea.  No abdominal pain, fever or vomiting. Has been using Tylenol and Flexeril at home without relief  The history is provided by the patient.  Back Pain Associated symptoms: no dysuria        Home Medications Prior to Admission medications   Medication Sig Start Date End Date Taking? Authorizing Provider  acetaminophen (TYLENOL) 325 MG tablet Take 2 tablets (650 mg total) by mouth every 6 (six) hours as needed. Patient taking differently: Take 650 mg by mouth every 6 (six) hours as needed for mild pain. 02/11/21   Jeanell Sparrow, DO  ALPRAZolam Duanne Moron) 0.5 MG tablet Take 1 tablet (0.5 mg total) by mouth 4 (four) times daily as needed for anxiety. 12/02/21   [name], PMHNP  atorvastatin (LIPITOR) 40 MG tablet Take 40 mg by mouth every evening.    [provider]  b complex vitamins capsule Take 1 capsule by mouth daily.    [provider]  BD PEN NEEDLE  NANO 2ND GEN 32G X 4 MM MISC USE TO INJECT AS DIRECTED FIVE TIMES DAILY 10/15/21   Elayne Snare, MD  bismuth subsalicylate (PEPTO BISMOL) 262 MG/15ML suspension Take 30 mLs by mouth every 6 (six) hours as needed.    [provider]  cephALEXin (KEFLEX) 500 MG capsule Take 1 capsule (500 mg total) by mouth 3 (three) times daily. 11/12/21   Danford, Suann Larry, MD  cholecalciferol (VITAMIN D3) 25 MCG (1000 UNIT) tablet Take 1,000 Units by mouth daily. Also takes 2,000 units capsule at bedtime    [provider]  CONTOUR NEXT TEST test strip USE TO TEST BLOOD SUGAR TWICE DAILY 08/01/21   Elayne Snare, MD  dicyclomine (BENTYL) 20 MG tablet Take 1 tablet (20 mg total) by mouth 3 (three) times daily as needed for spasms. 06/17/21   Shelly Coss, MD  divalproex (DEPAKOTE) 250 MG DR tablet Take 2 tablets (500 mg total) by mouth at bedtime. 11/04/21   [name], PMHNP  donepezil (ARICEPT) 10 MG tablet Take 1 tablet (10 mg total) by mouth at bedtime. 10/02/21   [name], PMHNP  exenatide (BYETTA 5 MCG PEN) 5 MCG/0.02ML SOPN injection Inject 5 mcg into the skin 2 (two) times daily with a meal. 11/26/21   Elayne Snare, MD  ezetimibe (ZETIA) 10 MG tablet Take 1 tablet (10 mg total) by mouth daily. 10/07/21   Elayne Snare,  MD  fluticasone (FLONASE) 50 MCG/ACT nasal spray Place into both nostrils as needed for allergies or rhinitis.    [provider]  gabapentin (NEURONTIN) 600 MG tablet Take 600 mg by mouth 3 (three) times daily. 05/12/19   [provider]  insulin glargine (LANTUS SOLOSTAR) 100 UNIT/ML Solostar Pen ADMINISTER 66 UNITS UNDER THE SKIN AT BEDTIME Patient taking differently: ADMINISTER 68 UNITS UNDER THE SKIN AT BEDTIME 10/07/21   Elayne Snare, MD  insulin lispro (HUMALOG KWIKPEN) 100 UNIT/ML KwikPen ADMINISTER 10 UNITS UNDER THE SKIN BEFORE MEALS Patient taking differently: Inject 10 Units into the skin 3 (three) times daily as needed (high blood sugar). 10 units  before breakfast and lunch and 12 units before dinner 05/30/21   Elayne Snare, MD  levothyroxine (SYNTHROID) 75 MCG tablet TAKE 1 TABLET BY MOUTH EVERY DAY BEFORE BREAKFAST Patient taking differently: Take 75 mcg by mouth daily before breakfast. 03/27/21   Elayne Snare, MD  liothyronine (CYTOMEL) 5 MCG tablet TAKE 1 TABLET BY MOUTH DAILY WITH LEVOTHYROXINE. TAKE BEFORE BREAKFAST Patient taking differently: Take 5 mcg by mouth daily. 03/27/21   Elayne Snare, MD  lithium carbonate (ESKALITH) 450 MG CR tablet Take 1 tablet (450 mg total) by mouth at bedtime. 11/04/21   [name], PMHNP  loperamide (IMODIUM) 2 MG capsule Take 2 capsules (4 mg total) by mouth 3 (three) times daily as needed for diarrhea or loose stools. 11/12/21   Danford, Suann Larry, MD  loratadine (CLARITIN) 10 MG tablet Take 10 mg by mouth daily as needed for allergies.    [provider]  losartan (COZAAR) 25 MG tablet Take 25 mg by mouth every evening. 08/31/19   [provider]  memantine (NAMENDA) 10 MG tablet TAKE 1 TABLET(10 MG) BY MOUTH EVERY EVENING Patient taking differently: Take 10 mg by mouth daily. TAKE 1 TABLET(10 MG) BY MOUTH EVERY EVENING 10/02/21   [name], PMHNP  Microlet Lancets MISC TEST DAILY AS DIRECTED 08/01/21   Elayne Snare, MD  nitroGLYCERIN (NITROSTAT) 0.4 MG SL tablet Place 1 tablet (0.4 mg total) under the tongue every 5 (five) minutes as needed for chest pain. 09/14/17   Lyda Jester M, PA-C  ondansetron (ZOFRAN) 4 MG tablet Take 1 tablet (4 mg total) by mouth every 6 (six) hours. 09/20/21   Curatolo, Adam, DO  oxyCODONE (ROXICODONE) 5 MG immediate release tablet Take 1 tablet (5 mg total) by mouth every 6 (six) hours as needed for up to 10 doses for breakthrough pain. 07/04/21   Curatolo, Adam, DO  oxyCODONE-acetaminophen (PERCOCET/ROXICET) 5-325 MG tablet Take 1 tablet by mouth every 6 (six) hours as needed for severe pain. 06/17/21   Shelly Coss, MD  PEG  3350-KCl-NaBcb-NaCl-NaSulf (PEG-3350/ELECTROLYTES) 236 g SOLR Take 17 g by mouth daily as needed. 10/06/21   [provider]  QUEtiapine (SEROQUEL) 200 MG tablet TAKE 1 TAB PO q 5 pm and 1 tab po QHS Patient taking differently: Take 200 mg by mouth. TAKE 1 TAB PO q 5 pm and 1 tab po QHS 10/02/21   [name], PMHNP  rOPINIRole (REQUIP) 0.5 MG tablet Takes 4 tabs at 5 pm and 3 tabs at bedtime and 1-2 tabs prn 10/02/21   [name], PMHNP  tiZANidine (ZANAFLEX) 4 MG tablet Take 1 tablet (4 mg total) by mouth every 6 (six) hours as needed for muscle spasms. 47/82/95   Delora Fuel, MD  vancomycin (VANCOCIN) 125 MG capsule Take 1 capsule (125 mg total) by mouth 2 (two) times  daily. 11/12/21   Danford, Suann Larry, MD  Wheat Dextrin (BENEFIBER PO) Take 1 Dose by mouth daily.    [provider]      Allergies    Codeine, Procaine hcl, Epinephrine, Aspirin, Augmentin [amoxicillin-pot clavulanate], Benadryl [diphenhydramine], Diflucan [fluconazole], Dilaudid [hydromorphone hcl], Hydromorphone, Morphine, Other, Prednisone, Tramadol hcl, and Sulfa antibiotics    Review of Systems   Review of Systems  Genitourinary:  Negative for dysuria.  Musculoskeletal:  Positive for back pain and myalgias.   all other systems are negative except as noted in the HPI and PMH.    Physical Exam Updated Vital Signs BP 133/68 (BP Location: Right Arm)   Pulse 90   Temp 98.4 F (36.9 C) (Oral)   Resp 17   Ht '5\' 1"'$  (1.549 m)   Wt 85.3 kg   SpO2 92%   BMI 35.52 kg/m  Physical Exam Vitals and nursing note reviewed.  Constitutional:      General: She is not in acute distress.    Appearance: Normal appearance. She is well-developed and normal weight. She is not ill-appearing.  HENT:     Head: Normocephalic and atraumatic.     Mouth/Throat:     Pharynx: No oropharyngeal exudate.  Eyes:     Conjunctiva/sclera: Conjunctivae normal.     Pupils: Pupils are equal, round, and reactive to  light.  Neck:     Comments: No meningismus. Cardiovascular:     Rate and Rhythm: Normal rate and regular rhythm.     Heart sounds: Normal heart sounds. No murmur heard. Pulmonary:     Effort: Pulmonary effort is normal. No respiratory distress.     Breath sounds: Normal breath sounds.  Abdominal:     Palpations: Abdomen is soft.     Tenderness: There is no abdominal tenderness. There is no guarding or rebound.  Musculoskeletal:        General: Tenderness present. Normal range of motion.     Cervical back: Normal range of motion and neck supple.     Comments: Right paraspinal lumbar tenderness, no midline tenderness  5/5 strength in bilateral lower extremities. Ankle plantar and dorsiflexion intact. Great toe extension intact bilaterally. +2 DP and PT pulses. +2 patellar reflexes bilaterally. Normal gait.   Skin:    General: Skin is warm.  Neurological:     Mental Status: She is alert and oriented to person, place, and time.     Cranial Nerves: No cranial nerve deficit.     Motor: No abnormal muscle tone.     Coordination: Coordination normal.     Comments:  5/5 strength throughout. CN 2-12 intact.Equal grip strength.   Psychiatric:        Behavior: Behavior normal.     ED Results / Procedures / Treatments   Labs (all labs ordered are listed, but only abnormal results are displayed) Labs Reviewed  URINALYSIS, ROUTINE W REFLEX MICROSCOPIC - Abnormal; Notable for the following components:      Result Value   Leukocytes,Ua TRACE (*)    All other components within normal limits  CBC WITH DIFFERENTIAL/PLATELET - Abnormal; Notable for the following components:   Abs Immature Granulocytes 0.08 (*)    All other components within normal limits  BASIC METABOLIC PANEL - Abnormal; Notable for the following components:   Glucose, Bld 178 (*)    Calcium 10.5 (*)    All other components within normal limits  URINE CULTURE  D-DIMER, QUANTITATIVE    EKG None  Radiology CT Angio  Chest/Abd/Pel for Dissection W and/or Wo Contrast  Result Date: 11/26/2021 CLINICAL DATA:  Injury to back lifting. Evaluate for acute aortic syndrome. EXAM: CT ANGIOGRAPHY CHEST, ABDOMEN AND PELVIS TECHNIQUE: Non-contrast CT of the chest was initially obtained. Multidetector CT imaging through the chest, abdomen and pelvis was performed using the standard protocol during bolus administration of intravenous contrast. Multiplanar reconstructed images and MIPs were obtained and reviewed to evaluate the vascular anatomy. RADIATION DOSE REDUCTION: This exam was performed according to the departmental dose-optimization program which includes automated exposure control, adjustment of the mA and/or kV according to patient size and/or use of iterative reconstruction technique. CONTRAST:  34m OMNIPAQUE IOHEXOL 350 MG/ML SOLN COMPARISON:  Abdominopelvic CT 11/09/2021 and 09/20/2021. FINDINGS: CTA CHEST FINDINGS Cardiovascular: Pre contrast images demonstrate mild atherosclerosis of the aorta without displaced intimal calcifications. Following contrast, the aorta enhances normally. No evidence of aneurysm, dissection or penetrating ulcer. There is no evidence of acute pulmonary embolism. The heart size is normal. There is no pericardial effusion. Mediastinum/Nodes: There are no enlarged mediastinal, hilar or axillary lymph nodes. Scattered small mediastinal lymph nodes are not pathologically enlarged. There is a heterogeneous right thyroid nodule measuring 1.8 cm on image 17/5. There is a small hiatal hernia. Lungs/Pleura: No pleural effusion or pneumothorax. 6 mm left lower lobe pulmonary nodule appears unchanged from at least 08/23/2020, consistent with a benign finding. No suspicious pulmonary nodules or confluent airspace opacities. Musculoskeletal/Chest wall: No chest wall mass or suspicious osseous findings. Mild spondylosis. Review of the MIP images confirms the above findings. CTA ABDOMEN AND PELVIS FINDINGS VASCULAR  Aorta: Normal caliber aorta without aneurysm, dissection or significant stenosis. Mild atherosclerosis. Celiac: Patent without evidence of aneurysm, dissection or significant stenosis. SMA: Patent without evidence of aneurysm, dissection or significant stenosis. Renals: Both renal arteries are patent without evidence of aneurysm, dissection or significant stenosis. IMA: Patent without evidence of aneurysm, dissection, vasculitis or significant stenosis. Inflow: Patent without evidence of aneurysm, dissection, vasculitis or significant stenosis. Veins: No obvious venous abnormality within the limitations of this arterial phase study. Review of the MIP images confirms the above findings. NON-VASCULAR FINDINGS Hepatobiliary: The liver is normal in density without suspicious focal abnormality. No evidence of gallstones, gallbladder wall thickening or biliary dilatation. Pancreas: Unremarkable. No pancreatic ductal dilatation or surrounding inflammatory changes. Spleen: Normal in size without focal abnormality. Adrenals/Urinary Tract: Both adrenal glands appear normal. Stable right renal cortical scarring and pelvicaliectasis, likely due to chronic UPJ stenosis. The right ureter is normal in caliber. No evidence of urinary tract calculus. The bladder appears normal for its degree of distention. Stomach/Bowel: No enteric contrast administered. The stomach appears unremarkable for its degree of distension. No evidence of bowel wall thickening, distention or surrounding inflammatory change. Postsurgical changes from partial sigmoid colon resection and anastomosis. Lymphatic: There are no enlarged abdominal or pelvic lymph nodes. Reproductive: Hysterectomy.  No adnexal mass. Other: Stable tiny umbilical hernia containing only fat. No ascites or free air. Musculoskeletal: No acute or significant osseous findings. Multilevel lumbar spondylosis with facet hypertrophy. Previous right total hip arthroplasty. Review of the MIP  images confirms the above findings. IMPRESSION: 1. No acute vascular findings within the chest, abdomen or pelvis. No evidence of acute aortic syndrome. 2. Stable findings consistent with chronic right UPJ stenosis. No evidence of acute collecting system obstruction or urinary tract calculus. 3. No acute abdominopelvic findings. 4.  Aortic Atherosclerosis (ICD10-I70.0). Electronically Signed   By: WRichardean SaleM.D.   On: 11/26/2021 18:16  Procedures Procedures  {Document cardiac monitor, telemetry assessment procedure when appropriate:1}  Medications Ordered in ED Medications  naproxen (NAPROSYN) tablet 500 mg (has no administration in time range)  methocarbamol (ROBAXIN) tablet 500 mg (has no administration in time range)    ED Course/ Medical Decision Making/ A&P                           Medical Decision Making Amount and/or Complexity of Data Reviewed Labs: ordered. Decision-making details documented in ED Course. Radiology: ordered and independent interpretation performed. Decision-making details documented in ED Course. ECG/medicine tests: ordered and independent interpretation performed. Decision-making details documented in ED Course.  Risk Prescription drug management.   Low back pain after cleaning out a closet yesterday.  No specific injury.  On exam she is neurologically intact with good strength, sensation, pulses and reflexes bilaterally.  Low suspicion for cord compression or cauda equina.  Urinalysis is negative.  Creatinine is at baseline.  CT the scan shows no evidence of aortic aneurysm or kidney stone.  No aortic pathology seen. Unchanged right UPJ stenosis with patient states she and her urologist are aware of  Patient's pain has improved with treatment in the ED.  She is able to ambulate with her cane which is baseline.  Patient is pain has improved.  States she cannot take anti-inflammatories at home or p.o. steroids due to her bipolar medications.  We  will treat supportively with anti-inflammatories and muscle relaxers and PCP follow-up.  Return precautions discussed including worsening pain, fever, vomiting, any other concerns. {Document critical care time when appropriate:1} {Document review of labs and clinical decision tools ie heart score, Chads2Vasc2 etc:1}  {Document your independent review of radiology images, and any outside records:1} {Document your discussion with family members, caretakers, and with consultants:1} {Document social determinants of health affecting pt's care:1} {Document your decision making why or why not admission, treatments were needed:1} Final Clinical Impression(s) / ED Diagnoses Final diagnoses:  None    Rx / DC Orders ED Discharge Orders     None

## 2021-11-26 NOTE — Patient Instructions (Addendum)
Take Humalog 10-12 before Bfst and lunch   [place]etta 5ug before supper

## 2021-11-26 NOTE — ED Notes (Signed)
Ambulated patient in hallway. Patient stated pain was an 8 by=ut was able to ambulate with walker

## 2021-11-26 NOTE — ED Notes (Signed)
RN provided AVS using Teachback Method. Patient verbalizes understanding of Discharge Instructions. Opportunity for Questioning and Answers were provided by RN. Patient Discharged from ED ambulatory with [facility] to Home via Self.

## 2021-11-26 NOTE — ED Notes (Signed)
Able to ambulate independently to restroom with assistive device (cane) with baseline gait

## 2021-11-26 NOTE — Progress Notes (Signed)
Patient ID: [name] [name], female   DOB: [birth year], 76 y.o.   MRN: [id number]            Reason for Appointment: Follow-up    History of Present Illness:          Date of diagnosis of type 2 diabetes mellitus: ?  2014        Background history:   She thinks her blood sugar was 400 at the time of diagnosis but no detailed records of this are available She did have an A1c of 10.6 done in 2014 and was probably given Lantus for some time initially Apparently she was treated with various medications including metformin, Janumet and Tradgenta. Her blood sugars had improved and A1c in 2015 was down to 6.2 She tends to have diarrhea with metformin and Janumet and also she thinks it causes dry mouth Most likely Janumet was stopped in 06/2015 Glipizide was started in 8/17 when blood sugars were higher and A1c was 9%  Recent history:   INSULIN regimen is:   Lantus 68 units daily at 5 pm, Humalog 10 units at meals  Non-insulin hypoglycemic drugs the patient is taking are: Currently none  Her A1c is 6.8, previously was at 6.7 compared to 7.7   Current management, blood sugar patterns and problems identified:  She has been taking Humalog but on further questioning and review of her download indicates that she is likely taking this only when the blood sugar is high and not proactively before meals She came in today but she thought her blood sugars were up and down like a roller coaster but overall her blood sugars are within the target range 69% of the time Her main difficulty appears to be snacking excessively late at night when she has the highest blood sugars and does not take insulin at that time  She was told to take 70 units of Lantus but only taking 68 Also gaining some weight She feels like she needs a medication to control her appetite She is not able to do any exercise    Side effects from medications have been:?  Diarrhea from metformin and Janumet, Jardiance caused yeast  infections, abdominal discomfort from Ozempic  Blood sugar patterns: Blood sugars are the highest late at night after about 10 PM through 2 AM but are within the target range during the daytime.  She does have periodic hyperglycemia after meals during the day but not consistently OVERNIGHT blood sugars are in the 190s average after 10 PM and then progressively decline down to about 150 around 8 AM POSTPRANDIAL readings are not consistently high but increasing at times in the low 200 range after breakfast and lunch Blood sugars are generally going up progressively after 8 PM No hypoglycemia  CGM use % of time 90  2-week average/GV 167  Time in range 16 7   %  % Time Above 180 29+2  % Time above 250   % Time Below 70 0     Previous data:  CGM use % of time 79  2-week average/GV 139  Time in range    90    %  % Time Above 180 10  % Time above 250   % Time Below 70      PRE-MEAL Fasting Lunch Dinner Bedtime Overall  Glucose range:       Averages: 127    139   POST-MEAL PC Breakfast PC Lunch PC Dinner  Glucose range:  Averages: 137 149 154     Previously  PRE-MEAL Fasting Lunch Dinner Bedtime Overall  Glucose range: 117-211    86-227  Mean/median: 162  143  143   POST-MEAL PC Breakfast PC Lunch PC Dinner  Glucose range:   ?  Mean/median:       Typical meal intake: Breakfast at 9,  May be eggs and sausage and lunch is usually a sandwich   Dinner 7 pm               Dietician visit, most recent: 10/18                Weight history:   Wt Readings from Last 3 Encounters:  11/26/21 188 lb (85.3 kg)  11/26/21 188 lb (85.3 kg)  11/08/21 193 lb (87.5 kg)    Glycemic control:   Lab Results  Component Value Date   HGBA1C 6.8 (H) 10/06/2021   HGBA1C 6.7 (A) 07/16/2021   HGBA1C 7.7 (H) 03/25/2021   Lab Results  Component Value Date   MICROALBUR <0.7 08/09/2020   LDLCALC 79 08/09/2020   CREATININE 0.78 11/12/2021   Lab Results  Component Value Date    MICRALBCREAT 1.1 08/09/2020       Allergies as of 11/26/2021       Reactions   Codeine Anxiety, Other (See Comments)   Hallucinations, tolerates oxycodone    Procaine Hcl Palpitations   Epinephrine    Other reaction(s): Other (See Comments) Makes heart race at the dentist Makes heart race at the dentist   Aspirin Nausea And Vomiting, Other (See Comments)   Reaction:  Burns pts stomach    Augmentin [amoxicillin-pot Clavulanate] Diarrhea   Benadryl [diphenhydramine] Other (See Comments)   Per MD "inhibits potency of gabapentin, lithium etc"   Diflucan [fluconazole] Other (See Comments)   Unknown reaction   Dilaudid [hydromorphone Hcl] Other (See Comments)   Migraines and nightmares    Hydromorphone Other (See Comments)   Morphine Other (See Comments)   headache   Other Nausea And Vomiting, Other (See Comments)   Pt states that all -mycins cause N/V. (MACROLIDES)    Prednisone    Long term steroid problems with lithium and blood sugar.    Tramadol Hcl    Other reaction(s): felt weird   Sulfa Antibiotics Other (See Comments)   Unknown reaction        Medication List        Accurate as of November 26, 2021  4:27 PM. If you have any questions, ask your nurse or doctor.          acetaminophen 325 MG tablet Commonly known as: Tylenol Take 2 tablets (650 mg total) by mouth every 6 (six) hours as needed. What changed: reasons to take this   ALPRAZolam 0.5 MG tablet Commonly known as: XANAX Take 1 tablet (0.5 mg total) by mouth 4 (four) times daily as needed for anxiety. Start taking on: December 02, 2021   atorvastatin 40 MG tablet Commonly known as: LIPITOR Take 40 mg by mouth every evening.   b complex vitamins capsule Take 1 capsule by mouth daily.   BD Pen Needle Nano 2nd Gen 32G X 4 MM Misc Generic drug: Insulin Pen Needle USE TO INJECT AS DIRECTED FIVE TIMES DAILY   BENEFIBER PO Take 1 Dose by mouth daily.   bismuth subsalicylate 660 YT/01SW  suspension Commonly known as: PEPTO BISMOL Take 30 mLs by mouth every 6 (six) hours as needed.   Byetta 5  MCG Pen 5 MCG/0.02ML Sopn injection Generic drug: exenatide Inject 5 mcg into the skin 2 (two) times daily with a meal. Started by: Elayne Snare, MD   cephALEXin 500 MG capsule Commonly known as: KEFLEX Take 1 capsule (500 mg total) by mouth 3 (three) times daily.   cholecalciferol 25 MCG (1000 UNIT) tablet Commonly known as: VITAMIN D3 Take 1,000 Units by mouth daily. Also takes 2,000 units capsule at bedtime   Contour Next Test test strip Generic drug: glucose blood USE TO TEST BLOOD SUGAR TWICE DAILY   dicyclomine 20 MG tablet Commonly known as: BENTYL Take 1 tablet (20 mg total) by mouth 3 (three) times daily as needed for spasms.   divalproex 250 MG DR tablet Commonly known as: DEPAKOTE Take 2 tablets (500 mg total) by mouth at bedtime.   donepezil 10 MG tablet Commonly known as: ARICEPT Take 1 tablet (10 mg total) by mouth at bedtime.   ezetimibe 10 MG tablet Commonly known as: Zetia Take 1 tablet (10 mg total) by mouth daily.   fluticasone 50 MCG/ACT nasal spray Commonly known as: FLONASE Place into both nostrils as needed for allergies or rhinitis.   gabapentin 600 MG tablet Commonly known as: NEURONTIN Take 600 mg by mouth 3 (three) times daily.   insulin lispro 100 UNIT/ML KwikPen Commonly known as: HumaLOG KwikPen ADMINISTER 10 UNITS UNDER THE SKIN BEFORE MEALS What changed:  how much to take how to take this when to take this reasons to take this additional instructions   Lantus SoloStar 100 UNIT/ML Solostar Pen Generic drug: insulin glargine ADMINISTER 66 UNITS UNDER THE SKIN AT BEDTIME What changed: additional instructions   levothyroxine 75 MCG tablet Commonly known as: SYNTHROID TAKE 1 TABLET BY MOUTH EVERY DAY BEFORE BREAKFAST What changed: See the new instructions.   liothyronine 5 MCG tablet Commonly known as: CYTOMEL TAKE 1  TABLET BY MOUTH DAILY WITH LEVOTHYROXINE. TAKE BEFORE BREAKFAST What changed: See the new instructions.   lithium carbonate 450 MG CR tablet Commonly known as: ESKALITH Take 1 tablet (450 mg total) by mouth at bedtime.   loperamide 2 MG capsule Commonly known as: IMODIUM Take 2 capsules (4 mg total) by mouth 3 (three) times daily as needed for diarrhea or loose stools.   loratadine 10 MG tablet Commonly known as: CLARITIN Take 10 mg by mouth daily as needed for allergies.   losartan 25 MG tablet Commonly known as: COZAAR Take 25 mg by mouth every evening.   memantine 10 MG tablet Commonly known as: NAMENDA TAKE 1 TABLET(10 MG) BY MOUTH EVERY EVENING What changed:  how much to take how to take this when to take this   Microlet Lancets Misc TEST DAILY AS DIRECTED   nitroGLYCERIN 0.4 MG SL tablet Commonly known as: NITROSTAT Place 1 tablet (0.4 mg total) under the tongue every 5 (five) minutes as needed for chest pain.   ondansetron 4 MG tablet Commonly known as: ZOFRAN Take 1 tablet (4 mg total) by mouth every 6 (six) hours.   oxyCODONE 5 MG immediate release tablet Commonly known as: Roxicodone Take 1 tablet (5 mg total) by mouth every 6 (six) hours as needed for up to 10 doses for breakthrough pain.   oxyCODONE-acetaminophen 5-325 MG tablet Commonly known as: PERCOCET/ROXICET Take 1 tablet by mouth every 6 (six) hours as needed for severe pain.   PEG-3350/Electrolytes 236 g Solr Take 17 g by mouth daily as needed.   QUEtiapine 200 MG tablet Commonly known as: SEROQUEL  TAKE 1 TAB PO q 5 pm and 1 tab po QHS What changed:  how much to take how to take this   rOPINIRole 0.5 MG tablet Commonly known as: REQUIP Takes 4 tabs at 5 pm and 3 tabs at bedtime and 1-2 tabs prn   tiZANidine 4 MG tablet Commonly known as: Zanaflex Take 1 tablet (4 mg total) by mouth every 6 (six) hours as needed for muscle spasms.   vancomycin 125 MG capsule Commonly known as:  VANCOCIN Take 1 capsule (125 mg total) by mouth 2 (two) times daily.        Allergies:  Allergies  Allergen Reactions   Codeine Anxiety and Other (See Comments)    Hallucinations, tolerates oxycodone    Procaine Hcl Palpitations   Epinephrine     Other reaction(s): Other (See Comments) Makes heart race at the dentist Makes heart race at the dentist    Aspirin Nausea And Vomiting and Other (See Comments)    Reaction:  Burns pts stomach    Augmentin [Amoxicillin-Pot Clavulanate] Diarrhea   Benadryl [Diphenhydramine] Other (See Comments)    Per MD "inhibits potency of gabapentin, lithium etc"   Diflucan [Fluconazole] Other (See Comments)    Unknown reaction    Dilaudid [Hydromorphone Hcl] Other (See Comments)    Migraines and nightmares    Hydromorphone Other (See Comments)   Morphine Other (See Comments)    headache   Other Nausea And Vomiting and Other (See Comments)    Pt states that all -mycins cause N/V. (MACROLIDES)    Prednisone     Long term steroid problems with lithium and blood sugar.    Tramadol Hcl     Other reaction(s): felt weird   Sulfa Antibiotics Other (See Comments)    Unknown reaction    Past Medical History:  Diagnosis Date   Alcohol abuse    Anxiety    takes Valium daily as needed and Ativan daily   Bilateral hearing loss    Bipolar 1 disorder ([place])    takes Lithium nightly and Synthroid daily   Chronic back pain    DDD; "all over" (09/14/2017)   Colitis, ischemic ([place]) 2012   Confusion    r/t meds   Depression    takes Prozac daily and Bupspirone    Diverticulosis    Dyslipidemia    takes Crestor daily   Fibromyalgia    Gastroparesis    Headache    "weekly" (09/14/2017)   Hepatic steatosis 06/18/12   severe   Hyperlipidemia    Hypertension    Hypothyroidism    IBS (irritable bowel syndrome)    Ischemic colitis ([place])    Joint pain    Joint swelling    Lupus erythematosus tumidus    tumid-skin   Migraine    "1-2/yr; maybe"  (09/14/2017)   Numbness    in right foot   Osteoarthritis    "all over" (09/14/2017)   Osteoarthritis cervical spine    Osteoarthritis of hand    bilateral   Pneumonia    "walking pneumonia several times; long time since the last time" (09/14/2017)   Restless leg syndrome    takes Requip nightly   Sciatica    Type II diabetes mellitus ([name])    Urinary frequency    Urinary leakage    Urinary urgency    Urinary, incontinence, stress female    Walking pneumonia    last time more than 31yr ago    Past Surgical History:  Procedure  Laterality Date   ABDOMINAL HYSTERECTOMY     "they left my ovaries"   APPENDECTOMY     BALLOON DILATION N/A 06/14/2020   Procedure: BALLOON DILATION;  Surgeon: Ronnette Juniper, MD;  Location: [facility];  Service: Gastroenterology;  Laterality: N/A;   BIOPSY  06/14/2020   Procedure: BIOPSY;  Surgeon: Ronnette Juniper, MD;  Location: [facility];  Service: Gastroenterology;;   CARDIAC CATHETERIZATION  09/14/2017   COLON RESECTION  05/2021   COLONOSCOPY     DENTAL SURGERY Left 10/2016   dental implant   DILATION AND CURETTAGE OF UTERUS  X 4   ESOPHAGOGASTRODUODENOSCOPY     ESOPHAGOGASTRODUODENOSCOPY (EGD) WITH PROPOFOL N/A 06/14/2020   Procedure: ESOPHAGOGASTRODUODENOSCOPY (EGD) WITH PROPOFOL;  Surgeon: Ronnette Juniper, MD;  Location: [facility];  Service: Gastroenterology;  Laterality: N/A;   FLEXIBLE SIGMOIDOSCOPY N/A 06/21/2012   Procedure: FLEXIBLE SIGMOIDOSCOPY;  Surgeon: Jerene Bears, MD;  Location: [facility];  Service: Gastroenterology;  Laterality: N/A;   JOINT REPLACEMENT     LEFT HEART CATH AND CORONARY ANGIOGRAPHY N/A 09/14/2017   Procedure: LEFT HEART CATH AND CORONARY ANGIOGRAPHY;  Surgeon: Belva Crome, MD;  Location: [facility];  Service: Cardiovascular;  Laterality: N/A;   SHOULDER ARTHROSCOPY Right    "shaved spurs off rotator cuff"   TONSILLECTOMY     TOTAL HIP ARTHROPLASTY Right 06/09/2013   Procedure: TOTAL HIP ARTHROPLASTY;   Surgeon: Kerin Salen, MD;  Location: [facility];  Service: Orthopedics;  Laterality: Right;   TOTAL KNEE ARTHROPLASTY Left 02/07/2019   Procedure: TOTAL KNEE ARTHROPLASTY;  Surgeon: Paralee Cancel, MD;  Location: [facility];  Service: Orthopedics;  Laterality: Left;  70 mins   TUBAL LIGATION     TUMOR EXCISION Right 1968   angle of jaw; benign    Family History  Problem Relation Age of Onset   Drug abuse Mother    Alcohol abuse Mother    Alcohol abuse Father    Hypertension Father    CAD Brother    Hypertension Brother    Alcohol abuse Brother    Hypertension Brother    Hypertension Brother    Psoriasis Daughter    Arthritis Daughter        psoriatic arthritis    Alcohol abuse Grandchild     Social History:  reports that she has never smoked. She has never used smokeless tobacco. She reports that she does not currently use alcohol. She reports that she does not currently use drugs after having used the following drugs: Benzodiazepines. Frequency: 7.00 times per week.   Review of Systems    Lipid history: On Lipitor 40 mg   Triglycerides over 400 LDL high on the last visit and Zetia was added  Taking OTC fish oil    Lab Results  Component Value Date   CHOL 257 (H) 10/06/2021   CHOL 172 08/09/2020   CHOL 230 (H) 12/04/2019   Lab Results  Component Value Date   HDL 56.70 10/06/2021   HDL 56.90 08/09/2020   HDL 56.10 12/04/2019   Lab Results  Component Value Date   LDLCALC 79 08/09/2020   Lab Results  Component Value Date   TRIG (H) 10/06/2021    406.0 Triglyceride is over 400; calculations on Lipids are invalid.   TRIG 180.0 (H) 08/09/2020   TRIG 322.0 (H) 12/04/2019   Lab Results  Component Value Date   CHOLHDL 5 10/06/2021   CHOLHDL 3 08/09/2020   CHOLHDL 4 12/04/2019   Lab Results  Component Value Date   LDLDIRECT 148.0 10/06/2021   LDLDIRECT 137.0 12/04/2019   LDLDIRECT 116.0 08/28/2019            ?  Hypertension: Currently only on 25 mg  losartan  BP Readings from Last 3 Encounters:  11/26/21 (!) 141/61  11/26/21 118/70  11/12/21 135/66     Most recent foot exam: 6/21  THYROID: She previously had been on levothyroxine and Cytomel from her psychiatrist for several years with uncertain diagnosis She is taking levothyroxine 75 g, Also on liothyronine 5 mcg, previously T3 level was low when this was stopped  Also on lithium  Her TSH is consistently normal Free T3 and free T4 also normal  Labs as follows:  Lab Results  Component Value Date   TSH 0.67 10/06/2021   TSH 0.925 07/06/2021   TSH 1.04 03/25/2021   FREET4 0.76 10/06/2021   FREET4 0.68 07/06/2021   FREET4 0.93 03/25/2021   Lab Results  Component Value Date   T3FREE 2.8 10/06/2021   T3FREE 3.2 03/25/2021   T3FREE 2.2 (L) 12/24/2020   T3FREE 3.2 08/28/2019   T3FREE 2.7 04/26/2019    She has atypical depression on multiple drugs, has been on Seroquel for quite some time  THYROID nodule:  She was found to have a nodule in her thyroid isthmus in 8/20 Ultrasound done in 1/21 showed multinodular goiter with only 1 significant nodule   However nodule in the isthmus was biopsied on the recommendation of her PCP in 3/22 and that showed benign follicular nodule  She has had mild/variable hypercalcemia, last levels:  Lab Results  Component Value Date   PTH 35 10/25/2018   CALCIUM 9.6 11/12/2021   CAION 1.30 07/14/2016   PHOS 2.9 12/26/2010   She reportedly has GASTROPARESIS  Currently not on any specific treatment Taking medications as needed for nausea  She is on Namenda and Aricept for dementia  Physical Examination:  BP 118/70   Pulse 70   Ht '5\' 1"'$  (1.549 m)   Wt 188 lb (85.3 kg)   SpO2 92%   BMI 35.52 kg/m         ASSESSMENT:  Diabetes type 2, on insulin  See history of present illness for detailed discussion of current diabetes management, blood sugar patterns, monitor download and problems identified  She is on 68 units  Lantus insulin and also on mealtime insulin without any other hypoglycemic drugs  Her A1c is last at 6.8  She is not taking her Humalog consistently as directed and only when the blood sugar goes up, also not controlling her blood sugar with her late evening snacks  Fasting readings are also somewhat increased Overall 69% blood sugars within target  PLAN:  Discussed the need for taking Humalog before starting to eat regardless of Premeal blood sugar She will only skip the dose if she is eating a low carbohydrate meal No change in Lantus but discussed that if her fasting readings continue to stay high will need to increase the dose also  Trial of BYETTA before dinnertime to help with postprandial control as well as hopefully improve her compliance with diet late in the evening Explained to her how this works as well as benefits on postprandial readings and satiety  She will start with 5 mcg daily 30 minutes before dinner To call if she has any excessive nausea with this If well-tolerated may try to give her Byetta twice a day  For breakfast and lunch she will take  10 to 12 units of Humalog based on meal size but make sure she takes her Humalog before starting to eat  Follow-up lipids on the next visit  Patient Instructions  Take Humalog 10-12 before Bfst and lunch   Byetta 5ug before supper     Total visit time including counseling = 30 minutes   Elayne Snare 11/26/2021, 4:27 PM   Note: This office note was prepared with Dragon voice recognition system technology. Any transcriptional errors that result from this process are unintentional.

## 2021-11-27 ENCOUNTER — Other Ambulatory Visit (HOSPITAL_COMMUNITY): Payer: Self-pay

## 2021-11-27 ENCOUNTER — Telehealth: Payer: Self-pay

## 2021-11-27 NOTE — Telephone Encounter (Signed)
Received notification from [facility] regarding a prior authorization for Byetta 5 MCG Pen 5MCG/0.02ML pen-injectors.  Authorization has been APPROVED from 11-27-2021 to 11-28-2022.   Key: [id number]

## 2021-11-27 NOTE — Telephone Encounter (Signed)
PA request received through [employer] from [facility] for Byetta 5 MCG Pen 5MCG/0.02ML pen-injectors.  PA has been submitted, awaiting determination.   Key: [id number]

## 2021-11-28 LAB — URINE CULTURE: Culture: <10000 — AB

## 2021-12-03 ENCOUNTER — Encounter: Payer: Self-pay | Admitting: Endocrinology

## 2021-12-04 DIAGNOSIS — E119 Type 2 diabetes mellitus without complications: Secondary | ICD-10-CM | POA: Diagnosis not present

## 2021-12-04 DIAGNOSIS — H2513 Age-related nuclear cataract, bilateral: Secondary | ICD-10-CM | POA: Diagnosis not present

## 2021-12-04 DIAGNOSIS — H53143 Visual discomfort, bilateral: Secondary | ICD-10-CM | POA: Diagnosis not present

## 2021-12-04 DIAGNOSIS — H52223 Regular astigmatism, bilateral: Secondary | ICD-10-CM | POA: Diagnosis not present

## 2021-12-04 DIAGNOSIS — H5203 Hypermetropia, bilateral: Secondary | ICD-10-CM | POA: Diagnosis not present

## 2021-12-04 LAB — HM DIABETES EYE EXAM

## 2021-12-06 ENCOUNTER — Other Ambulatory Visit: Payer: Self-pay

## 2021-12-06 ENCOUNTER — Encounter (HOSPITAL_BASED_OUTPATIENT_CLINIC_OR_DEPARTMENT_OTHER): Payer: Self-pay | Admitting: Emergency Medicine

## 2021-12-06 ENCOUNTER — Emergency Department (HOSPITAL_BASED_OUTPATIENT_CLINIC_OR_DEPARTMENT_OTHER)
Admission: EM | Admit: 2021-12-06 | Discharge: 2021-12-06 | Disposition: A | Discharge status: Home / Self Care | Payer: Medicare Other | Attending: Emergency Medicine | Admitting: Emergency Medicine

## 2021-12-06 ENCOUNTER — Emergency Department (HOSPITAL_BASED_OUTPATIENT_CLINIC_OR_DEPARTMENT_OTHER): Payer: Medicare Other

## 2021-12-06 DIAGNOSIS — R519 Headache, unspecified: Secondary | ICD-10-CM | POA: Diagnosis not present

## 2021-12-06 DIAGNOSIS — Z03818 Encounter for observation for suspected exposure to other biological agents ruled out: Secondary | ICD-10-CM | POA: Diagnosis not present

## 2021-12-06 DIAGNOSIS — J0100 Acute maxillary sinusitis, unspecified: Secondary | ICD-10-CM | POA: Diagnosis not present

## 2021-12-06 DIAGNOSIS — R0981 Nasal congestion: Secondary | ICD-10-CM | POA: Diagnosis not present

## 2021-12-06 NOTE — ED Provider Notes (Signed)
[facility]ERG[facility] DEPT Provider Note   CSN: [id number] Arrival date & time: 12/06/21  1047     History  No chief complaint on file.   [name] [name] is a 76 y.o. female who presents emergency department chief complaint of sinusitis.  Patient is requesting CT maxillofacial to make sure that she actually has a sinus infection.  She has been having 2 weeks of sinus pressure, nasal congestion worse at night, frontal headache.  She denies fevers.  Patient is concerned because she has a history of recurrent C. difficile colitis and has had in the past a fecal transplant.  She wants to make 100% sure that she needs to take an antibiotic before when it is prescribed.  HPI     Home Medications Prior to Admission medications   Medication Sig Start Date End Date Taking? Authorizing Provider  acetaminophen (TYLENOL) 325 MG tablet Take 2 tablets (650 mg total) by mouth every 6 (six) hours as needed. Patient taking differently: Take 650 mg by mouth every 6 (six) hours as needed for mild pain. 02/11/21   Jeanell Sparrow, DO  ALPRAZolam Duanne Moron) 0.5 MG tablet Take 1 tablet (0.5 mg total) by mouth 4 (four) times daily as needed for anxiety. 12/02/21   [name], PMHNP  atorvastatin (LIPITOR) 40 MG tablet Take 40 mg by mouth every evening.    [provider]  b complex vitamins capsule Take 1 capsule by mouth daily.    [provider]  BD PEN NEEDLE NANO 2ND GEN 32G X 4 MM MISC USE TO INJECT AS DIRECTED FIVE TIMES DAILY 10/15/21   Elayne Snare, MD  bismuth subsalicylate (PEPTO BISMOL) 262 MG/15ML suspension Take 30 mLs by mouth every 6 (six) hours as needed.    [provider]  cephALEXin (KEFLEX) 500 MG capsule Take 1 capsule (500 mg total) by mouth 3 (three) times daily. 11/12/21   Danford, Suann Larry, MD  cholecalciferol (VITAMIN D3) 25 MCG (1000 UNIT) tablet Take 1,000 Units by mouth daily. Also takes 2,000 units capsule at bedtime    [provider]  CONTOUR NEXT TEST test strip USE TO TEST BLOOD SUGAR TWICE DAILY 08/01/21   Elayne Snare, MD  dicyclomine (BENTYL) 20 MG tablet Take 1 tablet (20 mg total) by mouth 3 (three) times daily as needed for spasms. 06/17/21   Shelly Coss, MD  divalproex (DEPAKOTE) 250 MG DR tablet Take 2 tablets (500 mg total) by mouth at bedtime. 11/04/21   [name], PMHNP  donepezil (ARICEPT) 10 MG tablet Take 1 tablet (10 mg total) by mouth at bedtime. 10/02/21   [name], PMHNP  exenatide (BYETTA 5 MCG PEN) 5 MCG/0.02ML SOPN injection Inject 5 mcg into the skin 2 (two) times daily with a meal. 11/26/21   Elayne Snare, MD  ezetimibe (ZETIA) 10 MG tablet Take 1 tablet (10 mg total) by mouth daily. 10/07/21   Elayne Snare, MD  fluticasone Asencion Islam) 50 MCG/ACT nasal spray Place into both nostrils as needed for allergies or rhinitis.    [provider]  gabapentin (NEURONTIN) 600 MG tablet Take 600 mg by mouth 3 (three) times daily. 05/12/19   [provider]  insulin glargine (LANTUS SOLOSTAR) 100 UNIT/ML Solostar Pen ADMINISTER 66 UNITS UNDER THE SKIN AT BEDTIME Patient taking differently: ADMINISTER 68 UNITS UNDER THE SKIN AT BEDTIME 10/07/21   Elayne Snare, MD  insulin lispro (HUMALOG KWIKPEN) 100 UNIT/ML KwikPen ADMINISTER 10 UNITS UNDER THE SKIN BEFORE MEALS Patient taking differently:  Inject 10 Units into the skin 3 (three) times daily as needed (high blood sugar). 10 units before breakfast and lunch and 12 units before dinner 05/30/21   Elayne Snare, MD  levothyroxine (SYNTHROID) 75 MCG tablet TAKE 1 TABLET BY MOUTH EVERY DAY BEFORE BREAKFAST Patient taking differently: Take 75 mcg by mouth daily before breakfast. 03/27/21   Elayne Snare, MD  liothyronine (CYTOMEL) 5 MCG tablet TAKE 1 TABLET BY MOUTH DAILY WITH LEVOTHYROXINE. TAKE BEFORE BREAKFAST Patient taking differently: Take 5 mcg by mouth daily. 03/27/21   Elayne Snare, MD  lithium carbonate (ESKALITH) 450 MG CR tablet Take 1  tablet (450 mg total) by mouth at bedtime. 11/04/21   [name], PMHNP  loperamide (IMODIUM) 2 MG capsule Take 2 capsules (4 mg total) by mouth 3 (three) times daily as needed for diarrhea or loose stools. 11/12/21   Danford, Suann Larry, MD  loratadine (CLARITIN) 10 MG tablet Take 10 mg by mouth daily as needed for allergies.    [provider]  losartan (COZAAR) 25 MG tablet Take 25 mg by mouth every evening. 08/31/19   [provider]  memantine (NAMENDA) 10 MG tablet TAKE 1 TABLET(10 MG) BY MOUTH EVERY EVENING Patient taking differently: Take 10 mg by mouth daily. TAKE 1 TABLET(10 MG) BY MOUTH EVERY EVENING 10/02/21   [name], PMHNP  methocarbamol (ROBAXIN) 500 MG tablet Take 1 tablet (500 mg total) by mouth every 8 (eight) hours as needed for muscle spasms. 11/26/21   Rancour, Annie Main, MD  Microlet Lancets MISC TEST DAILY AS DIRECTED 08/01/21   Elayne Snare, MD  nitroGLYCERIN (NITROSTAT) 0.4 MG SL tablet Place 1 tablet (0.4 mg total) under the tongue every 5 (five) minutes as needed for chest pain. 09/14/17   Lyda Jester M, PA-C  ondansetron (ZOFRAN) 4 MG tablet Take 1 tablet (4 mg total) by mouth every 6 (six) hours. 09/20/21   Curatolo, Adam, DO  oxyCODONE (ROXICODONE) 5 MG immediate release tablet Take 1 tablet (5 mg total) by mouth every 6 (six) hours as needed for up to 10 doses for breakthrough pain. 07/04/21   Curatolo, Adam, DO  oxyCODONE-acetaminophen (PERCOCET/ROXICET) 5-325 MG tablet Take 1 tablet by mouth every 6 (six) hours as needed for severe pain. 06/17/21   Shelly Coss, MD  PEG 3350-KCl-NaBcb-NaCl-NaSulf (PEG-3350/ELECTROLYTES) 236 g SOLR Take 17 g by mouth daily as needed. 10/06/21   [provider]  QUEtiapine (SEROQUEL) 200 MG tablet TAKE 1 TAB PO q 5 pm and 1 tab po QHS Patient taking differently: Take 200 mg by mouth. TAKE 1 TAB PO q 5 pm and 1 tab po QHS 10/02/21   [name], PMHNP  rOPINIRole (REQUIP) 0.5 MG tablet Takes 4 tabs  at 5 pm and 3 tabs at bedtime and 1-2 tabs prn 10/02/21   [name], PMHNP  vancomycin (VANCOCIN) 125 MG capsule Take 1 capsule (125 mg total) by mouth 2 (two) times daily. 11/12/21   Danford, Suann Larry, MD  Wheat Dextrin (BENEFIBER PO) Take 1 Dose by mouth daily.    [provider]      Allergies    Codeine, Procaine hcl, Epinephrine, Aspirin, Augmentin [amoxicillin-pot clavulanate], Benadryl [diphenhydramine], Diflucan [fluconazole], Dilaudid [hydromorphone hcl], Hydromorphone, Morphine, Other, Prednisone, Tramadol hcl, and Sulfa antibiotics    Review of Systems   Review of Systems  Physical Exam Updated Vital Signs BP (!) 153/79 (BP Location: Right Arm)   Pulse 88   Temp 98.2 F (36.8 C)   Resp 16  SpO2 98%  Physical Exam Vitals and nursing note reviewed.  Constitutional:      General: She is not in acute distress.    Appearance: She is well-developed. She is not diaphoretic.  HENT:     Head: Normocephalic and atraumatic.     Comments: Sinus tenderness on palpation    Right Ear: External ear normal.     Left Ear: External ear normal.     Nose: Nose normal.     Mouth/Throat:     Mouth: Mucous membranes are moist.  Eyes:     General: No scleral icterus.    Conjunctiva/sclera: Conjunctivae normal.  Cardiovascular:     Rate and Rhythm: Normal rate and regular rhythm.     Heart sounds: Normal heart sounds. No murmur heard.    No friction rub. No gallop.  Pulmonary:     Effort: Pulmonary effort is normal. No respiratory distress.     Breath sounds: Normal breath sounds.  Abdominal:     General: Bowel sounds are normal. There is no distension.     Palpations: Abdomen is soft. There is no mass.     Tenderness: There is no abdominal tenderness. There is no guarding.  Musculoskeletal:     Cervical back: Normal range of motion.  Skin:    General: Skin is warm and dry.  Neurological:     Mental Status: She is alert and oriented to person, place, and time.   Psychiatric:        Behavior: Behavior normal.     ED Results / Procedures / Treatments   Labs (all labs ordered are listed, but only abnormal results are displayed) Labs Reviewed - No data to display  EKG None  Radiology No results found.  Procedures Procedures    Medications Ordered in ED Medications - No data to display  ED Course/ Medical Decision Making/ A&P                           Medical Decision Making 76 year old female with history of recurrent C. difficile previous fecal transplant who presents to the emergency department to get a CT scan to make sure she does not have infection in her sinuses.  I ordered, visualized and personally of interpreted CT maxillofacial which shows sinus inflammation without evidence of infection, abscess, meningitis, mucormycosis..  I have reviewed these findings with the patient.  She should take over-the-counter sinus medication, rinse with saline and follow-up with ENT.  Discussed outpatient follow-up and return precautions.  Appears appropriate for discharge at this time  Amount and/or Complexity of Data Reviewed Radiology: ordered and independent interpretation performed.           Final Clinical Impression(s) / ED Diagnoses Final diagnoses:  Acute non-recurrent maxillary sinusitis    Rx / DC Orders ED Discharge Orders     None         Margarita Mail, PA-C 12/06/21 1323    Regan Lemming, MD 12/06/21 1435

## 2021-12-06 NOTE — Discharge Instructions (Signed)
Take over-the-counter sinus medication.  You may use sinus rinses and saline to help irrigate your sinuses.  Please call the ear nose and throat doctor to follow-up with soon as possible for follow-up. Get help right away if you have severe headache, vision changes, fevers.  Otherwise please follow-up with ENT and your primary care doctor.

## 2021-12-06 NOTE — ED Triage Notes (Signed)
Past 2 weeks pressure in face/head,headaches, gets stopped up at night and cant breath. Pt has hx of cdiff x4 and has fecal implant, is told to not take antibiotics unless absolutely necessary. Urgent care offered antibiotics, but pt stated she needed to know for sure if infection, so here for ct scan.

## 2021-12-06 NOTE — ED Notes (Signed)
Discharge paperwork given and verbally understood. 

## 2021-12-08 ENCOUNTER — Telehealth: Payer: Self-pay | Admitting: *Deleted

## 2021-12-08 NOTE — Telephone Encounter (Signed)
        Patient  visited [facility]  ed on 11/26/2021  for muscle strain   Telephone encounter attempt :  1st  A HIPAA compliant voice message was left requesting a return call.  Instructed patient to call back at [phone].  [place] [phone] [address] , [place] [address] Email : [email or web address]

## 2021-12-09 ENCOUNTER — Telehealth: Payer: Self-pay | Admitting: *Deleted

## 2021-12-09 NOTE — Telephone Encounter (Signed)
        Patient  visited [facility] ed on 11/26/2021  for Strain   Telephone encounter attempt :  2ng  A HIPAA compliant voice message was left requesting a return call.  Instructed patient to call back at [phone].  [place] [facility] [phone] [address] , [place] [address] Email : [email or web address]

## 2021-12-10 ENCOUNTER — Other Ambulatory Visit: Payer: Self-pay | Admitting: Endocrinology

## 2021-12-10 ENCOUNTER — Telehealth: Payer: Self-pay | Admitting: *Deleted

## 2021-12-10 DIAGNOSIS — E119 Type 2 diabetes mellitus without complications: Secondary | ICD-10-CM

## 2021-12-10 NOTE — Telephone Encounter (Signed)
        [facility] ed on 11/26/2021  for Strain   Telephone encounter attempt :  3rd  A HIPAA compliant voice message was left requesting a return call.  Instructed patient to call back at [phone]. [place] [phone] [address] , [place] [address] Email : [email or web address]

## 2021-12-10 NOTE — Telephone Encounter (Signed)
Called and left voice mail

## 2021-12-12 ENCOUNTER — Other Ambulatory Visit: Payer: Self-pay

## 2021-12-12 ENCOUNTER — Emergency Department (HOSPITAL_BASED_OUTPATIENT_CLINIC_OR_DEPARTMENT_OTHER)
Admission: EM | Admit: 2021-12-12 | Discharge: 2021-12-12 | Disposition: A | Discharge status: Home / Self Care | Payer: Medicare Other | Attending: Emergency Medicine | Admitting: Emergency Medicine

## 2021-12-12 ENCOUNTER — Encounter (HOSPITAL_BASED_OUTPATIENT_CLINIC_OR_DEPARTMENT_OTHER): Payer: Self-pay

## 2021-12-12 ENCOUNTER — Emergency Department (HOSPITAL_BASED_OUTPATIENT_CLINIC_OR_DEPARTMENT_OTHER): Payer: Medicare Other

## 2021-12-12 DIAGNOSIS — N390 Urinary tract infection, site not specified: Secondary | ICD-10-CM

## 2021-12-12 DIAGNOSIS — Z79899 Other long term (current) drug therapy: Secondary | ICD-10-CM | POA: Diagnosis not present

## 2021-12-12 DIAGNOSIS — N3289 Other specified disorders of bladder: Secondary | ICD-10-CM | POA: Diagnosis not present

## 2021-12-12 DIAGNOSIS — N200 Calculus of kidney: Secondary | ICD-10-CM | POA: Diagnosis not present

## 2021-12-12 DIAGNOSIS — I10 Essential (primary) hypertension: Secondary | ICD-10-CM | POA: Diagnosis not present

## 2021-12-12 DIAGNOSIS — E119 Type 2 diabetes mellitus without complications: Secondary | ICD-10-CM | POA: Insufficient documentation

## 2021-12-12 DIAGNOSIS — E039 Hypothyroidism, unspecified: Secondary | ICD-10-CM | POA: Insufficient documentation

## 2021-12-12 DIAGNOSIS — N3001 Acute cystitis with hematuria: Secondary | ICD-10-CM | POA: Diagnosis not present

## 2021-12-12 DIAGNOSIS — D72829 Elevated white blood cell count, unspecified: Secondary | ICD-10-CM | POA: Insufficient documentation

## 2021-12-12 DIAGNOSIS — Z96641 Presence of right artificial hip joint: Secondary | ICD-10-CM | POA: Diagnosis not present

## 2021-12-12 DIAGNOSIS — Z96652 Presence of left artificial knee joint: Secondary | ICD-10-CM | POA: Diagnosis not present

## 2021-12-12 DIAGNOSIS — Z955 Presence of coronary angioplasty implant and graft: Secondary | ICD-10-CM | POA: Insufficient documentation

## 2021-12-12 DIAGNOSIS — Z794 Long term (current) use of insulin: Secondary | ICD-10-CM | POA: Diagnosis not present

## 2021-12-12 DIAGNOSIS — R1031 Right lower quadrant pain: Secondary | ICD-10-CM | POA: Diagnosis present

## 2021-12-12 LAB — CBC
HCT: 43.3 % (ref 36.0–46.0)
Hemoglobin: 13.9 g/dL (ref 12.0–15.0)
MCH: 29.8 pg (ref 26.0–34.0)
MCHC: 32.1 g/dL (ref 30.0–36.0)
MCV: 92.9 fL (ref 80.0–100.0)
Platelets: 208 10*3/uL (ref 150–400)
RBC: 4.66 MIL/uL (ref 3.87–5.11)
RDW: 13.5 % (ref 11.5–15.5)
WBC: 11.3 10*3/uL — ABNORMAL HIGH (ref 4.0–10.5)
nRBC: 0 % (ref 0.0–0.2)

## 2021-12-12 LAB — COMPREHENSIVE METABOLIC PANEL
ALT: 15 U/L (ref 0–44)
AST: 15 U/L (ref 15–41)
Albumin: 4.4 g/dL (ref 3.5–5.0)
Alkaline Phosphatase: 81 U/L (ref 38–126)
Anion gap: 11 (ref 5–15)
BUN: 11 mg/dL (ref 8–23)
CO2: 27 mmol/L (ref 22–32)
Calcium: 10.3 mg/dL (ref 8.9–10.3)
Chloride: 102 mmol/L (ref 98–111)
Creatinine, Ser: 0.92 mg/dL (ref 0.44–1.00)
GFR, Estimated: >60 mL/min (ref >60)
Glucose, Bld: 153 mg/dL — ABNORMAL HIGH (ref 70–99)
Potassium: 4 mmol/L (ref 3.5–5.1)
Sodium: 140 mmol/L (ref 135–145)
Total Bilirubin: 0.4 mg/dL (ref 0.3–1.2)
Total Protein: 7.2 g/dL (ref 6.5–8.1)

## 2021-12-12 LAB — URINALYSIS, ROUTINE W REFLEX MICROSCOPIC
Bilirubin Urine: NEGATIVE
Glucose, UA: NEGATIVE mg/dL
Ketones, ur: NEGATIVE mg/dL
Nitrite: NEGATIVE
Specific Gravity, Urine: 1.01 (ref 1.005–1.030)
pH: 6.5 (ref 5.0–8.0)

## 2021-12-12 LAB — LIPASE, BLOOD: Lipase: 31 U/L (ref 11–51)

## 2021-12-12 LAB — CBG MONITORING, ED: Glucose-Capillary: 139 mg/dL — ABNORMAL HIGH (ref 70–99)

## 2021-12-12 MED ORDER — IOHEXOL 300 MG/ML  SOLN
100.0000 mL | Freq: Once | INTRAMUSCULAR | Status: AC | PRN
  Administered 2021-12-12: 80 mL via INTRAVENOUS

## 2021-12-12 MED ORDER — SODIUM CHLORIDE 0.9 % IV SOLN
1.0000 g | Freq: Once | INTRAVENOUS | Status: AC
  Administered 2021-12-12: 1 g via INTRAVENOUS
  Filled 2021-12-12: qty 10

## 2021-12-12 MED ORDER — SODIUM CHLORIDE 0.9 % IV BOLUS
1000.0000 mL | Freq: Once | INTRAVENOUS | Status: AC
  Administered 2021-12-12: 1000 mL via INTRAVENOUS

## 2021-12-12 MED ORDER — PHENAZOPYRIDINE HCL 200 MG PO TABS: 200.0000 mg | ORAL_TABLET | Freq: Three times a day (TID) | ORAL | 0 refills | Status: DC

## 2021-12-12 MED ORDER — CEPHALEXIN 500 MG PO CAPS: 500.0000 mg | ORAL_CAPSULE | Freq: Three times a day (TID) | ORAL | 0 refills | Status: AC

## 2021-12-12 NOTE — Discharge Instructions (Addendum)
It was a pleasure caring for you today in the emergency department. ° °Please return to the emergency department for any worsening or worrisome symptoms. ° ° °

## 2021-12-12 NOTE — ED Provider Notes (Signed)
[facility] EMERGENCY DEPT Provider Note   CSN: [id number] Arrival date & time: 12/12/21  1326     History  Chief Complaint  Patient presents with   Abdominal Pain    [name] [name] is a 76 y.o. female.  Patient as above with significant medical history as below, including alcohol abuse, bipolar 1 disorder, depression, TLD, fibromyalgia, gastroparesis who presents to the ED with complaint of 4 days of right-sided, right lower quad abdominal pain, dysuria, hematuria, generalized malaise.  No fevers or chills.  No nausea or vomiting.  Tolerant p.o. daily difficulty.  No BRBPR or melena.  No abnormal vaginal bleeding or discharge.  No trauma.  Feels similar to prior UTI in the past.     Past Medical History:  Diagnosis Date   Alcohol abuse    Anxiety    takes Valium daily as needed and Ativan daily   Bilateral hearing loss    Bipolar 1 disorder ([place])    takes Lithium nightly and Synthroid daily   Chronic back pain    DDD; "all over" (09/14/2017)   Colitis, ischemic ([place]) 2012   Confusion    r/t meds   Depression    takes Prozac daily and Bupspirone    Diverticulosis    Dyslipidemia    takes Crestor daily   Fibromyalgia    Gastroparesis    Headache    "weekly" (09/14/2017)   Hepatic steatosis 06/18/12   severe   Hyperlipidemia    Hypertension    Hypothyroidism    IBS (irritable bowel syndrome)    Ischemic colitis ([place])    Joint pain    Joint swelling    Lupus erythematosus tumidus    tumid-skin   Migraine    "1-2/yr; maybe" (09/14/2017)   Numbness    in right foot   Osteoarthritis    "all over" (09/14/2017)   Osteoarthritis cervical spine    Osteoarthritis of hand    bilateral   Pneumonia    "walking pneumonia several times; long time since the last time" (09/14/2017)   Restless leg syndrome    takes Requip nightly   Sciatica    Type II diabetes mellitus ([place])    Urinary frequency    Urinary leakage    Urinary urgency    Urinary, incontinence,  stress female    Walking pneumonia    last time more than 16yr ago    Past Surgical History:  Procedure Laterality Date   ABDOMINAL HYSTERECTOMY     "they left my ovaries"   APPENDECTOMY     BALLOON DILATION N/A 06/14/2020   Procedure: BALLOON DILATION;  Surgeon: KRonnette Juniper MD;  Location: [facility];  Service: Gastroenterology;  Laterality: N/A;   BIOPSY  06/14/2020   Procedure: BIOPSY;  Surgeon: KRonnette Juniper MD;  Location: [facility];  Service: Gastroenterology;;   CARDIAC CATHETERIZATION  09/14/2017   COLON RESECTION  05/2021   COLONOSCOPY     DENTAL SURGERY Left 10/2016   dental implant   DILATION AND CURETTAGE OF UTERUS  X 4   ESOPHAGOGASTRODUODENOSCOPY     ESOPHAGOGASTRODUODENOSCOPY (EGD) WITH PROPOFOL N/A 06/14/2020   Procedure: ESOPHAGOGASTRODUODENOSCOPY (EGD) WITH PROPOFOL;  Surgeon: KRonnette Juniper MD;  Location: [facility];  Service: Gastroenterology;  Laterality: N/A;   FLEXIBLE SIGMOIDOSCOPY N/A 06/21/2012   Procedure: FLEXIBLE SIGMOIDOSCOPY;  Surgeon: JJerene Bears MD;  Location: [facility];  Service: Gastroenterology;  Laterality: N/A;   JOINT REPLACEMENT     LEFT HEART CATH AND CORONARY ANGIOGRAPHY N/A 09/14/2017  Procedure: LEFT HEART CATH AND CORONARY ANGIOGRAPHY;  Surgeon: Belva Crome, MD;  Location: [facility];  Service: Cardiovascular;  Laterality: N/A;   SHOULDER ARTHROSCOPY Right    "shaved spurs off rotator cuff"   TONSILLECTOMY     TOTAL HIP ARTHROPLASTY Right 06/09/2013   Procedure: TOTAL HIP ARTHROPLASTY;  Surgeon: Kerin Salen, MD;  Location: [facility];  Service: Orthopedics;  Laterality: Right;   TOTAL KNEE ARTHROPLASTY Left 02/07/2019   Procedure: TOTAL KNEE ARTHROPLASTY;  Surgeon: Paralee Cancel, MD;  Location: [facility];  Service: Orthopedics;  Laterality: Left;  70 mins   TUBAL LIGATION     TUMOR EXCISION Right 1968   angle of jaw; benign     The history is provided by the patient. No language interpreter was used.  Abdominal  Pain Associated symptoms: dysuria and hematuria   Associated symptoms: no chest pain, no cough, no fever, no nausea and no shortness of breath        Home Medications Prior to Admission medications   Medication Sig Start Date End Date Taking? Authorizing Provider  cephALEXin (KEFLEX) 500 MG capsule Take 1 capsule (500 mg total) by mouth 3 (three) times daily for 14 days. 12/12/21 12/26/21 Yes Jeanell Sparrow, DO  phenazopyridine (PYRIDIUM) 200 MG tablet Take 1 tablet (200 mg total) by mouth 3 (three) times daily. 12/12/21  Yes Jeanell Sparrow, DO  acetaminophen (TYLENOL) 325 MG tablet Take 2 tablets (650 mg total) by mouth every 6 (six) hours as needed. Patient taking differently: Take 650 mg by mouth every 6 (six) hours as needed for mild pain. 02/11/21   Jeanell Sparrow, DO  ALPRAZolam Duanne Moron) 0.5 MG tablet Take 1 tablet (0.5 mg total) by mouth 4 (four) times daily as needed for anxiety. 12/02/21   [name], PMHNP  atorvastatin (LIPITOR) 40 MG tablet Take 40 mg by mouth every evening.    [provider]  b complex vitamins capsule Take 1 capsule by mouth daily.    [provider]  BD PEN NEEDLE NANO 2ND GEN 32G X 4 MM MISC USE TO INJECT AS DIRECTED FIVE TIMES DAILY 10/15/21   Elayne Snare, MD  bismuth subsalicylate (PEPTO BISMOL) 262 MG/15ML suspension Take 30 mLs by mouth every 6 (six) hours as needed.    [provider]  cholecalciferol (VITAMIN D3) 25 MCG (1000 UNIT) tablet Take 1,000 Units by mouth daily. Also takes 2,000 units capsule at bedtime    [provider]  CONTOUR NEXT TEST test strip USE TO TEST BLOOD SUGAR TWICE DAILY 08/01/21   Elayne Snare, MD  dicyclomine (BENTYL) 20 MG tablet Take 1 tablet (20 mg total) by mouth 3 (three) times daily as needed for spasms. 06/17/21   Shelly Coss, MD  divalproex (DEPAKOTE) 250 MG DR tablet Take 2 tablets (500 mg total) by mouth at bedtime. 11/04/21   [name], PMHNP  donepezil (ARICEPT) 10 MG tablet  Take 1 tablet (10 mg total) by mouth at bedtime. 10/02/21   [name], PMHNP  exenatide (BYETTA 5 MCG PEN) 5 MCG/0.02ML SOPN injection Inject 5 mcg into the skin 2 (two) times daily with a meal. 11/26/21   Elayne Snare, MD  ezetimibe (ZETIA) 10 MG tablet Take 1 tablet (10 mg total) by mouth daily. 10/07/21   Elayne Snare, MD  fluticasone Asencion Islam) 50 MCG/ACT nasal spray Place into both nostrils as needed for allergies or rhinitis.    [provider]  gabapentin (NEURONTIN) 600 MG tablet  Take 600 mg by mouth 3 (three) times daily. 05/12/19   [provider]  insulin lispro (HUMALOG KWIKPEN) 100 UNIT/ML KwikPen ADMINISTER 10 UNITS UNDER THE SKIN BEFORE MEALS Patient taking differently: Inject 10 Units into the skin 3 (three) times daily as needed (high blood sugar). 10 units before breakfast and lunch and 12 units before dinner 05/30/21   Elayne Snare, MD  LANTUS SOLOSTAR 100 UNIT/ML Solostar Pen ADMINISTER 66 UNITS UNDER THE SKIN AT BEDTIME 12/12/21   Elayne Snare, MD  levothyroxine (SYNTHROID) 75 MCG tablet TAKE 1 TABLET BY MOUTH EVERY DAY BEFORE BREAKFAST Patient taking differently: Take 75 mcg by mouth daily before breakfast. 03/27/21   Elayne Snare, MD  liothyronine (CYTOMEL) 5 MCG tablet TAKE 1 TABLET BY MOUTH DAILY WITH LEVOTHYROXINE. TAKE BEFORE BREAKFAST Patient taking differently: Take 5 mcg by mouth daily. 03/27/21   Elayne Snare, MD  lithium carbonate (ESKALITH) 450 MG CR tablet Take 1 tablet (450 mg total) by mouth at bedtime. 11/04/21   [name], PMHNP  loperamide (IMODIUM) 2 MG capsule Take 2 capsules (4 mg total) by mouth 3 (three) times daily as needed for diarrhea or loose stools. 11/12/21   Danford, Suann Larry, MD  loratadine (CLARITIN) 10 MG tablet Take 10 mg by mouth daily as needed for allergies.    [provider]  losartan (COZAAR) 25 MG tablet Take 25 mg by mouth every evening. 08/31/19   [provider]  memantine (NAMENDA) 10 MG tablet TAKE 1  TABLET(10 MG) BY MOUTH EVERY EVENING Patient taking differently: Take 10 mg by mouth daily. TAKE 1 TABLET(10 MG) BY MOUTH EVERY EVENING 10/02/21   [name], PMHNP  methocarbamol (ROBAXIN) 500 MG tablet Take 1 tablet (500 mg total) by mouth every 8 (eight) hours as needed for muscle spasms. 11/26/21   Rancour, Annie Main, MD  Microlet Lancets MISC TEST DAILY AS DIRECTED 08/01/21   Elayne Snare, MD  nitroGLYCERIN (NITROSTAT) 0.4 MG SL tablet Place 1 tablet (0.4 mg total) under the tongue every 5 (five) minutes as needed for chest pain. 09/14/17   Lyda Jester M, PA-C  ondansetron (ZOFRAN) 4 MG tablet Take 1 tablet (4 mg total) by mouth every 6 (six) hours. 09/20/21   Curatolo, Adam, DO  oxyCODONE (ROXICODONE) 5 MG immediate release tablet Take 1 tablet (5 mg total) by mouth every 6 (six) hours as needed for up to 10 doses for breakthrough pain. 07/04/21   Curatolo, Adam, DO  oxyCODONE-acetaminophen (PERCOCET/ROXICET) 5-325 MG tablet Take 1 tablet by mouth every 6 (six) hours as needed for severe pain. 06/17/21   Shelly Coss, MD  PEG 3350-KCl-NaBcb-NaCl-NaSulf (PEG-3350/ELECTROLYTES) 236 g SOLR Take 17 g by mouth daily as needed. 10/06/21   [provider]  QUEtiapine (SEROQUEL) 200 MG tablet TAKE 1 TAB PO q 5 pm and 1 tab po QHS Patient taking differently: Take 200 mg by mouth. TAKE 1 TAB PO q 5 pm and 1 tab po QHS 10/02/21   [name], PMHNP  rOPINIRole (REQUIP) 0.5 MG tablet Takes 4 tabs at 5 pm and 3 tabs at bedtime and 1-2 tabs prn 10/02/21   [name], PMHNP  vancomycin (VANCOCIN) 125 MG capsule Take 1 capsule (125 mg total) by mouth 2 (two) times daily. 11/12/21   Danford, Suann Larry, MD  Wheat Dextrin (BENEFIBER PO) Take 1 Dose by mouth daily.    [provider]      Allergies    Codeine, Procaine hcl, Epinephrine, Aspirin, Augmentin [amoxicillin-pot clavulanate], Benadryl [diphenhydramine],  Diflucan [fluconazole], Dilaudid [hydromorphone hcl], Hydromorphone,  Morphine, Other, Prednisone, Tramadol hcl, and Sulfa antibiotics    Review of Systems   Review of Systems  Constitutional:  Negative for activity change and fever.  HENT:  Negative for facial swelling and trouble swallowing.   Eyes:  Negative for discharge and redness.  Respiratory:  Negative for cough and shortness of breath.   Cardiovascular:  Negative for chest pain and palpitations.  Gastrointestinal:  Positive for abdominal pain. Negative for nausea.  Genitourinary:  Positive for dysuria, hematuria and urgency. Negative for flank pain.  Musculoskeletal:  Negative for back pain and gait problem.  Skin:  Negative for pallor and rash.  Neurological:  Negative for syncope and headaches.    Physical Exam Updated Vital Signs BP (!) 167/87   Pulse 81   Temp 98.2 F (36.8 C)   Resp 16   Ht '5\' 1"'$  (1.549 m)   Wt 85.3 kg   SpO2 96%   BMI 35.53 kg/m  Physical Exam Vitals and nursing note reviewed.  Constitutional:      General: She is not in acute distress.    Appearance: Normal appearance.  HENT:     Head: Normocephalic and atraumatic.     Right Ear: External ear normal.     Left Ear: External ear normal.     Nose: Nose normal.     Mouth/Throat:     Mouth: Mucous membranes are moist.  Eyes:     General: No scleral icterus.       Right eye: No discharge.        Left eye: No discharge.  Cardiovascular:     Rate and Rhythm: Normal rate and regular rhythm.     Pulses: Normal pulses.     Heart sounds: Normal heart sounds.  Pulmonary:     Effort: Pulmonary effort is normal. No respiratory distress.     Breath sounds: Normal breath sounds.  Abdominal:     General: Abdomen is flat.     Palpations: Abdomen is soft.     Tenderness: There is abdominal tenderness in the right lower quadrant. There is no guarding or rebound.     Comments: Not peritoneal  No left-sided pain  Musculoskeletal:        General: Normal range of motion.     Cervical back: Normal range of motion.      Right lower leg: No edema.     Left lower leg: No edema.  Skin:    General: Skin is warm and dry.     Capillary Refill: Capillary refill takes less than 2 seconds.  Neurological:     Mental Status: She is alert.  Psychiatric:        Mood and Affect: Mood normal.        Behavior: Behavior normal.     ED Results / Procedures / Treatments   Labs (all labs ordered are listed, but only abnormal results are displayed) Labs Reviewed  COMPREHENSIVE METABOLIC PANEL - Abnormal; Notable for the following components:      Result Value   Glucose, Bld 153 (*)    All other components within normal limits  CBC - Abnormal; Notable for the following components:   WBC 11.3 (*)    All other components within normal limits  URINALYSIS, ROUTINE W REFLEX MICROSCOPIC - Abnormal; Notable for the following components:   APPearance HAZY (*)    Hgb urine dipstick TRACE (*)    Protein, ur TRACE (*)  Leukocytes,Ua MODERATE (*)    Bacteria, UA RARE (*)    Non Squamous Epithelial 0-5 (*)    All other components within normal limits  CBG MONITORING, ED - Abnormal; Notable for the following components:   Glucose-Capillary 139 (*)    All other components within normal limits  URINE CULTURE  LIPASE, BLOOD    EKG None  Radiology CT ABDOMEN PELVIS W CONTRAST  Result Date: 12/12/2021 CLINICAL DATA:  Pain right lower quadrant of abdomen EXAM: CT ABDOMEN AND PELVIS WITH CONTRAST TECHNIQUE: Multidetector CT imaging of the abdomen and pelvis was performed using the standard protocol following bolus administration of intravenous contrast. RADIATION DOSE REDUCTION: This exam was performed according to the departmental dose-optimization program which includes automated exposure control, adjustment of the mA and/or kV according to patient size and/or use of iterative reconstruction technique. CONTRAST:  31m OMNIPAQUE IOHEXOL 300 MG/ML  SOLN COMPARISON:  11/26/2021 FINDINGS: Lower chest: No infiltrates are seen in  the visualized lower lung fields. Small linear densities are seen in left lower lung field suggesting scarring or subsegmental atelectasis. Hepatobiliary: There is no dilation of bile ducts. Gallbladder is not distended. Pancreas: There is minimal prominence of pancreatic duct. No focal abnormalities are seen. Spleen: Spleen is not enlarged. There is possible 6 mm calcified aneurysm in splenic artery branch. Adrenals/Urinary Tract: Adrenals are unremarkable. Right kidney is smaller than left. There are foci of cortical thinning in right kidney. There is moderate prominence of right pelvocaliceal system without dilation ureter. There is no hydronephrosis in the left kidney. There is 2 mm calculus in the lower pole of right kidney. Beam hardening artifacts limit evaluation of the urinary bladder. Urinary bladder is not distended. There is mild diffuse wall thickening in the bladder. Stomach/Bowel: Small hiatal hernia is seen. There is fluid in the lumen of lower thoracic esophagus. Stomach is not distended. Small bowel loops are not dilated. Appendix is not seen. There is no pericecal inflammation. There is no significant wall thickening in colon. Scattered diverticula seen in colon without signs of focal diverticulitis. Surgical staples are seen in sigmoid. Vascular/Lymphatic: There are scattered atherosclerotic plaques and calcifications in aorta and its major branches. Reproductive: Uterus is not seen. Other: There is no ascites or pneumoperitoneum. Umbilical hernia containing fat is seen. Skin thickening and mild subcutaneous stranding in the anterior abdominal wall may be related to parenteral administration of medications. Musculoskeletal: Degenerative changes are noted in lumbar spine, more prominent at L1-L2 and L3-L4 levels. There is minimal anterolisthesis at the L4-L5 level. There is previous right hip arthroplasty. IMPRESSION: There is no evidence of intestinal obstruction or pneumoperitoneum. There is no  hydronephrosis in the left kidney. There is moderate ectasia of right pelvocaliceal system suggesting ureteropelvic junction obstruction. Right kidney is smaller than left with foci of cortical thinning suggesting scarring from chronic pyelonephritis. 2 mm nonobstructing right renal stone. There is mild diffuse wall thickening in the urinary bladder which may be due to incomplete distention or cystitis. Small hiatal hernia. Gastroesophageal reflux. Scattered diverticula are seen in colon without signs of focal diverticulitis. Other findings as described in the body of the report. Electronically Signed   By: PElmer PickerM.D.   On: 12/12/2021 16:35    Procedures Procedures    Medications Ordered in ED Medications  cefTRIAXone (ROCEPHIN) 1 g in sodium chloride 0.9 % 100 mL IVPB (0 g Intravenous Stopped 12/12/21 1639)  sodium chloride 0.9 % bolus 1,000 mL (0 mLs Intravenous Stopped 12/12/21 1734)  iohexol (  OMNIPAQUE) 300 MG/ML solution 100 mL (80 mLs Intravenous Contrast Given 12/12/21 1609)    ED Course/ Medical Decision Making/ A&P                           Medical Decision Making Amount and/or Complexity of Data Reviewed Labs: ordered. Radiology: ordered.  Risk Prescription drug management.   This patient presents to the ED with chief complaint(s) of right lower quadrant, right flank pain, dysuria, hematuria, urgency with pertinent past medical history of recurrent UTI, gastroparesis which further complicates the presenting complaint. The complaint involves an extensive differential diagnosis and also carries with it a high risk of complications and morbidity.     Differential diagnosis includes but is not exclusive to ectopic pregnancy, ovarian cyst, ovarian torsion, acute appendicitis, urinary tract infection, endometriosis, bowel obstruction, hernia, colitis, renal colic, gastroenteritis, volvulus etc. . Serious etiologies were considered.   The initial plan is to screening labs  ordered in triage, urinalysis. Patient is also requesting CT scan to make sure she does not have infection in her kidneys or other abdomen infection  Additional history obtained: Additional history obtained from  not applicable Records reviewed  prior ED visits, prior primary care office notes, prior labs and imaging  Independent labs interpretation:  The following labs were independently interpreted:  Urinalysis concerning for infection, send culture CBC w/ mild leukocytosis at 11.3  Independent visualization of imaging: - I independently visualized the following imaging with scope of interpretation limited to determining acute life threatening conditions related to emergency care: CTAP, which revealed chronic renal scarring, cystitis, nonobstructive nephrolithiasis  Cardiac monitoring was reviewed and interpreted by myself which shows na  Treatment and Reassessment: Rocephin, IV fluids >> Symptoms improved  Consultation: - Consulted or discussed management/test interpretation w/ external professional: na  Consideration for admission or further workup: Admission was considered   Well-appearing 76 year old female presents the ED secondary to right lower quadrant, right flank pain, dysuria.  Physical exam is reassuring.  Abdomen is not peritoneal.  She is HDS.  She is breathing comfortably on ambient air. Labs reviewed, she is not septic.  UA consistent with UTI.  CT with chronic changes, cystitis concern also noted.  Sent urine culture.  Given first dose of Rocephin in the ED.  Reviewed prior urine culture. Give Keflex for home, Pyridium short course.  Discussed return precautions.  The patient improved significantly and was discharged in stable condition. Detailed discussions were had with the patient regarding current findings, and need for close f/u with PCP or on call doctor. The patient has been instructed to return immediately if the symptoms worsen in any way for re-evaluation.  Patient verbalized understanding and is in agreement with current care plan. All questions answered prior to discharge.    Social Determinants of health: Social History   Tobacco Use   Smoking status: Never   Smokeless tobacco: Never  Vaping Use   Vaping Use: Never used  Substance Use Topics   Alcohol use: Not Currently    Comment: 09/14/2017 "nothing since 04/15/2008"   Drug use: Not Currently    Frequency: 7.0 times per week    Types: Benzodiazepines            Final Clinical Impression(s) / ED Diagnoses Final diagnoses:  Urinary tract infection with hematuria, site unspecified    Rx / DC Orders ED Discharge Orders          Ordered    cephALEXin (KEFLEX)  500 MG capsule  3 times daily        12/12/21 1758    phenazopyridine (PYRIDIUM) 200 MG tablet  3 times daily        12/12/21 1758              Jeanell Sparrow, DO 12/12/21 1802

## 2021-12-12 NOTE — ED Triage Notes (Signed)
Patient here POV from Home.  Endorses Generalized ABD Pain (Mainly Left Sided) for approximately 4 Days. Patient believes it is due to a Recurrent UTI due to Dysuria such as Cloudy and Bloody Urine.   No N/V. No Known Fevers.   NAD Noted during Triage. A&Ox4. GCS 15. Ambulatory.

## 2021-12-14 LAB — URINE CULTURE: Culture: <10000 — AB

## 2021-12-15 ENCOUNTER — Encounter: Payer: Self-pay | Admitting: Endocrinology

## 2021-12-18 ENCOUNTER — Telehealth: Payer: Self-pay

## 2021-12-18 ENCOUNTER — Ambulatory Visit (INDEPENDENT_AMBULATORY_CARE_PROVIDER_SITE_OTHER): Payer: Medicare Other | Admitting: Psychiatry

## 2021-12-18 ENCOUNTER — Encounter: Payer: Self-pay | Admitting: Psychiatry

## 2021-12-18 DIAGNOSIS — G2581 Restless legs syndrome: Secondary | ICD-10-CM | POA: Diagnosis not present

## 2021-12-18 DIAGNOSIS — F5101 Primary insomnia: Secondary | ICD-10-CM | POA: Diagnosis not present

## 2021-12-18 DIAGNOSIS — F319 Bipolar disorder, unspecified: Secondary | ICD-10-CM | POA: Diagnosis not present

## 2021-12-18 MED ORDER — DIVALPROEX SODIUM 250 MG PO DR TAB: 500.0000 mg | DELAYED_RELEASE_TABLET | Freq: Every day | ORAL | 0 refills | Status: DC

## 2021-12-18 MED ORDER — ROPINIROLE HCL 0.5 MG PO TABS: ORAL_TABLET | ORAL | 0 refills | Status: DC

## 2021-12-18 MED ORDER — DONEPEZIL HCL 10 MG PO TABS: 10.0000 mg | ORAL_TABLET | Freq: Every day | ORAL | 0 refills | Status: DC

## 2021-12-18 MED ORDER — QUETIAPINE FUMARATE 200 MG PO TABS: ORAL_TABLET | ORAL | 0 refills | Status: DC

## 2021-12-18 MED ORDER — LITHIUM CARBONATE ER 450 MG PO TBCR: 450.0000 mg | EXTENDED_RELEASE_TABLET | Freq: Every day | ORAL | 0 refills | Status: DC

## 2021-12-18 MED ORDER — MEMANTINE HCL 10 MG PO TABS: ORAL_TABLET | ORAL | 0 refills | Status: DC

## 2021-12-18 NOTE — Telephone Encounter (Signed)
Patient LVM stating that she was advised to start a trial of Byetta at her last OV and has been experiencing a few side effects such as nausea and diarrhea and would like to know if theres an alternative as she can not continue to take Byetta. Please advise

## 2021-12-18 NOTE — Telephone Encounter (Signed)
Patient informed and advised to discontinue Byetta for now. Pt expressed her understanding and will keep her updated once a response is received from Dr. Dwyane Dee

## 2021-12-18 NOTE — Telephone Encounter (Signed)
Kumar pt. - hold off Byetta until Dr. Dwyane Dee weighs in.
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
## 2021-12-22 NOTE — Telephone Encounter (Signed)
Patient informed of results and expressed understanding

## 2021-12-25 DIAGNOSIS — F319 Bipolar disorder, unspecified: Secondary | ICD-10-CM | POA: Diagnosis not present

## 2021-12-25 DIAGNOSIS — M797 Fibromyalgia: Secondary | ICD-10-CM | POA: Diagnosis not present

## 2021-12-25 DIAGNOSIS — R0981 Nasal congestion: Secondary | ICD-10-CM | POA: Diagnosis not present

## 2021-12-25 DIAGNOSIS — E039 Hypothyroidism, unspecified: Secondary | ICD-10-CM | POA: Diagnosis not present

## 2021-12-25 DIAGNOSIS — B379 Candidiasis, unspecified: Secondary | ICD-10-CM | POA: Diagnosis not present

## 2021-12-25 DIAGNOSIS — M549 Dorsalgia, unspecified: Secondary | ICD-10-CM | POA: Diagnosis not present

## 2021-12-25 DIAGNOSIS — E785 Hyperlipidemia, unspecified: Secondary | ICD-10-CM | POA: Diagnosis not present

## 2021-12-25 DIAGNOSIS — E1165 Type 2 diabetes mellitus with hyperglycemia: Secondary | ICD-10-CM | POA: Diagnosis not present

## 2021-12-26 ENCOUNTER — Other Ambulatory Visit (INDEPENDENT_AMBULATORY_CARE_PROVIDER_SITE_OTHER): Payer: Medicare Other

## 2021-12-26 DIAGNOSIS — Z794 Long term (current) use of insulin: Secondary | ICD-10-CM

## 2021-12-26 DIAGNOSIS — E1165 Type 2 diabetes mellitus with hyperglycemia: Secondary | ICD-10-CM | POA: Diagnosis not present

## 2021-12-26 DIAGNOSIS — E782 Mixed hyperlipidemia: Secondary | ICD-10-CM | POA: Diagnosis not present

## 2021-12-26 DIAGNOSIS — E119 Type 2 diabetes mellitus without complications: Secondary | ICD-10-CM | POA: Diagnosis not present

## 2021-12-26 LAB — MICROALBUMIN / CREATININE URINE RATIO
Creatinine,U: 42.7 mg/dL
Microalb Creat Ratio: 1.6 mg/g (ref 0.0–30.0)
Microalb, Ur: <0.7 mg/dL (ref 0.0–1.9)

## 2021-12-26 LAB — COMPREHENSIVE METABOLIC PANEL
ALT: 19 U/L (ref 0–35)
AST: 19 U/L (ref 0–37)
Albumin: 4.3 g/dL (ref 3.5–5.2)
Alkaline Phosphatase: 85 U/L (ref 39–117)
BUN: 14 mg/dL (ref 6–23)
CO2: 23 mEq/L (ref 19–32)
Calcium: 10.4 mg/dL (ref 8.4–10.5)
Chloride: 105 mEq/L (ref 96–112)
Creatinine, Ser: 0.87 mg/dL (ref 0.40–1.20)
GFR: 65.86 mL/min (ref >60.00)
Glucose, Bld: 126 mg/dL — ABNORMAL HIGH (ref 70–99)
Potassium: 4.4 mEq/L (ref 3.5–5.1)
Sodium: 139 mEq/L (ref 135–145)
Total Bilirubin: 0.4 mg/dL (ref 0.2–1.2)
Total Protein: 6.8 g/dL (ref 6.0–8.3)

## 2021-12-26 LAB — LIPID PANEL
Cholesterol: 168 mg/dL (ref 0–200)
HDL: 60.9 mg/dL (ref >39.00)
NonHDL: 107.11
Total CHOL/HDL Ratio: 3
Triglycerides: 234 mg/dL — ABNORMAL HIGH (ref 0.0–149.0)
VLDL: 46.8 mg/dL — ABNORMAL HIGH (ref 0.0–40.0)

## 2021-12-26 LAB — BASIC METABOLIC PANEL
BUN: 14 mg/dL (ref 6–23)
CO2: 23 mEq/L (ref 19–32)
Calcium: 10.4 mg/dL (ref 8.4–10.5)
Chloride: 105 mEq/L (ref 96–112)
Creatinine, Ser: 0.87 mg/dL (ref 0.40–1.20)
GFR: 65.86 mL/min (ref >60.00)
Glucose, Bld: 126 mg/dL — ABNORMAL HIGH (ref 70–99)
Potassium: 4.4 mEq/L (ref 3.5–5.1)
Sodium: 139 mEq/L (ref 135–145)

## 2021-12-26 LAB — LDL CHOLESTEROL, DIRECT: Direct LDL: 84 mg/dL

## 2021-12-26 LAB — HEMOGLOBIN A1C: Hgb A1c MFr Bld: 6.8 % — ABNORMAL HIGH (ref 4.6–6.5)

## 2021-12-28 ENCOUNTER — Other Ambulatory Visit: Payer: Self-pay

## 2021-12-28 ENCOUNTER — Emergency Department (HOSPITAL_BASED_OUTPATIENT_CLINIC_OR_DEPARTMENT_OTHER)
Admission: EM | Admit: 2021-12-28 | Discharge: 2021-12-28 | Disposition: A | Discharge status: Home / Self Care | Payer: Medicare Other | Attending: Emergency Medicine | Admitting: Emergency Medicine

## 2021-12-28 DIAGNOSIS — Z794 Long term (current) use of insulin: Secondary | ICD-10-CM | POA: Insufficient documentation

## 2021-12-28 DIAGNOSIS — E039 Hypothyroidism, unspecified: Secondary | ICD-10-CM | POA: Diagnosis not present

## 2021-12-28 DIAGNOSIS — Z79899 Other long term (current) drug therapy: Secondary | ICD-10-CM | POA: Insufficient documentation

## 2021-12-28 DIAGNOSIS — E119 Type 2 diabetes mellitus without complications: Secondary | ICD-10-CM | POA: Diagnosis not present

## 2021-12-28 DIAGNOSIS — M545 Low back pain, unspecified: Secondary | ICD-10-CM | POA: Insufficient documentation

## 2021-12-28 DIAGNOSIS — I10 Essential (primary) hypertension: Secondary | ICD-10-CM | POA: Diagnosis not present

## 2021-12-28 MED ORDER — KETOROLAC TROMETHAMINE 15 MG/ML IJ SOLN
15.0000 mg | Freq: Once | INTRAMUSCULAR | Status: AC
  Administered 2021-12-28: 15 mg via INTRAMUSCULAR
  Filled 2021-12-28: qty 1

## 2021-12-28 MED ORDER — FENTANYL CITRATE PF 50 MCG/ML IJ SOSY
50.0000 ug | PREFILLED_SYRINGE | Freq: Once | INTRAMUSCULAR | Status: AC
  Administered 2021-12-28: 50 ug via INTRAMUSCULAR
  Filled 2021-12-28: qty 1

## 2021-12-28 NOTE — ED Provider Notes (Signed)
[facility] EMERGENCY DEPT Provider Note   CSN: [id number] Arrival date & time: 12/28/21  3762     History  Chief Complaint  Patient presents with   Back Pain    Excessive walking and standing last night  at a party increased back pain already present    [name] [name] is a 76 y.o. female.   Back Pain Patient with somewhat acute on chronic low back pain.  Has had issues with back pain in the past and states that last night she was at a party and began to have more pain after.  No injury.  Pain has been worse overnight.  Took Tylenol and Flexeril without relief.  No numbness or weakness.  States it does hurt to move around.  This is similar to pain she has had in the past.  No fevers or chills.    Past Medical History:  Diagnosis Date   Alcohol abuse    Anxiety    takes Valium daily as needed and Ativan daily   Bilateral hearing loss    Bipolar 1 disorder ([place])    takes Lithium nightly and Synthroid daily   Chronic back pain    DDD; "all over" (09/14/2017)   Colitis, ischemic ([place]) 2012   Confusion    r/t meds   Depression    takes Prozac daily and Bupspirone    Diverticulosis    Dyslipidemia    takes Crestor daily   Fibromyalgia    Gastroparesis    Headache    "weekly" (09/14/2017)   Hepatic steatosis 06/18/12   severe   Hyperlipidemia    Hypertension    Hypothyroidism    IBS (irritable bowel syndrome)    Ischemic colitis ([place])    Joint pain    Joint swelling    Lupus erythematosus tumidus    tumid-skin   Migraine    "1-2/yr; maybe" (09/14/2017)   Numbness    in right foot   Osteoarthritis    "all over" (09/14/2017)   Osteoarthritis cervical spine    Osteoarthritis of hand    bilateral   Pneumonia    "walking pneumonia several times; long time since the last time" (09/14/2017)   Restless leg syndrome    takes Requip nightly   Sciatica    Type II diabetes mellitus ([name])    Urinary frequency    Urinary leakage    Urinary urgency     Urinary, incontinence, stress female    Walking pneumonia    last time more than 44yr ago    Home Medications Prior to Admission medications   Medication Sig Start Date End Date Taking? Authorizing Provider  acetaminophen (TYLENOL) 325 MG tablet Take 2 tablets (650 mg total) by mouth every 6 (six) hours as needed. Patient taking differently: Take 650 mg by mouth every 6 (six) hours as needed for mild pain. 02/11/21   GJeanell Sparrow DO  ALPRAZolam (Duanne Moron 0.5 MG tablet Take 1 tablet (0.5 mg total) by mouth 4 (four) times daily as needed for anxiety. 12/02/21   CThayer Headings PMHNP  atorvastatin (LIPITOR) 40 MG tablet Take 40 mg by mouth every evening.    [provider]  b complex vitamins capsule Take 1 capsule by mouth daily.    [provider]  BD PEN NEEDLE NANO 2ND GEN 32G X 4 MM MISC USE TO INJECT AS DIRECTED FIVE TIMES DAILY 10/15/21   KElayne Snare MD  bismuth subsalicylate (PEPTO BISMOL) 262 MG/15ML suspension Take 30 mLs by  mouth every 6 (six) hours as needed. Patient not taking: Reported on 12/18/2021    [provider]  cholecalciferol (VITAMIN D3) 25 MCG (1000 UNIT) tablet Take 1,000 Units by mouth daily. Also takes 2,000 units capsule at bedtime    [provider]  CONTOUR NEXT TEST test strip USE TO TEST BLOOD SUGAR TWICE DAILY 08/01/21   Elayne Snare, MD  dicyclomine (BENTYL) 20 MG tablet Take 1 tablet (20 mg total) by mouth 3 (three) times daily as needed for spasms. 06/17/21   Shelly Coss, MD  divalproex (DEPAKOTE) 250 MG DR tablet Take 2 tablets (500 mg total) by mouth at bedtime. 12/18/21   [name], PMHNP  donepezil (ARICEPT) 10 MG tablet Take 1 tablet (10 mg total) by mouth at bedtime. 12/18/21   [name], PMHNP  exenatide (BYETTA 5 MCG PEN) 5 MCG/0.02ML SOPN injection Inject 5 mcg into the skin 2 (two) times daily with a meal. Patient not taking: Reported on 12/18/2021 11/26/21   Elayne Snare, MD  ezetimibe (ZETIA) 10 MG  tablet Take 1 tablet (10 mg total) by mouth daily. 10/07/21   Elayne Snare, MD  fluticasone Asencion Islam) 50 MCG/ACT nasal spray Place into both nostrils as needed for allergies or rhinitis.    [provider]  gabapentin (NEURONTIN) 600 MG tablet Take 600 mg by mouth 3 (three) times daily. 05/12/19   [provider]  insulin lispro (HUMALOG KWIKPEN) 100 UNIT/ML KwikPen ADMINISTER 10 UNITS UNDER THE SKIN BEFORE MEALS Patient taking differently: Inject 10 Units into the skin 3 (three) times daily as needed (high blood sugar). 10 units before breakfast and lunch and 12 units before dinner 05/30/21   Elayne Snare, MD  LANTUS SOLOSTAR 100 UNIT/ML Solostar Pen ADMINISTER 66 UNITS UNDER THE SKIN AT BEDTIME 12/12/21   Elayne Snare, MD  levothyroxine (SYNTHROID) 75 MCG tablet TAKE 1 TABLET BY MOUTH EVERY DAY BEFORE BREAKFAST Patient taking differently: Take 75 mcg by mouth daily before breakfast. 03/27/21   Elayne Snare, MD  liothyronine (CYTOMEL) 5 MCG tablet TAKE 1 TABLET BY MOUTH DAILY WITH LEVOTHYROXINE. TAKE BEFORE BREAKFAST Patient taking differently: Take 5 mcg by mouth daily. 03/27/21   Elayne Snare, MD  lithium carbonate (ESKALITH) 450 MG CR tablet Take 1 tablet (450 mg total) by mouth at bedtime. 12/18/21   [name], PMHNP  loperamide (IMODIUM) 2 MG capsule Take 2 capsules (4 mg total) by mouth 3 (three) times daily as needed for diarrhea or loose stools. 11/12/21   Danford, Suann Larry, MD  loratadine (CLARITIN) 10 MG tablet Take 10 mg by mouth daily as needed for allergies.    [provider]  losartan (COZAAR) 25 MG tablet Take 25 mg by mouth every evening. 08/31/19   [provider]  memantine (NAMENDA) 10 MG tablet TAKE 1 TABLET(10 MG) BY MOUTH EVERY EVENING 12/18/21   [name], PMHNP  methocarbamol (ROBAXIN) 500 MG tablet Take 1 tablet (500 mg total) by mouth every 8 (eight) hours as needed for muscle spasms. 11/26/21   Rancour, Annie Main, MD  Microlet Lancets  MISC TEST DAILY AS DIRECTED 08/01/21   Elayne Snare, MD  nitroGLYCERIN (NITROSTAT) 0.4 MG SL tablet Place 1 tablet (0.4 mg total) under the tongue every 5 (five) minutes as needed for chest pain. 09/14/17   Lyda Jester M, PA-C  ondansetron (ZOFRAN) 4 MG tablet Take 1 tablet (4 mg total) by mouth every 6 (six) hours. 09/20/21   Curatolo, Adam, DO  oxyCODONE (ROXICODONE) 5 MG immediate release  tablet Take 1 tablet (5 mg total) by mouth every 6 (six) hours as needed for up to 10 doses for breakthrough pain. Patient not taking: Reported on 12/18/2021 07/04/21   Lennice Sites, DO  oxyCODONE-acetaminophen (PERCOCET/ROXICET) 5-325 MG tablet Take 1 tablet by mouth every 6 (six) hours as needed for severe pain. Patient not taking: Reported on 12/18/2021 06/17/21   Shelly Coss, MD  PEG 3350-KCl-NaBcb-NaCl-NaSulf (PEG-3350/ELECTROLYTES) 236 g SOLR Take 17 g by mouth daily as needed. 10/06/21   [provider]  phenazopyridine (PYRIDIUM) 200 MG tablet Take 1 tablet (200 mg total) by mouth 3 (three) times daily. Patient not taking: Reported on 12/18/2021 12/12/21   Wynona Dove A, DO  QUEtiapine (SEROQUEL) 200 MG tablet TAKE 1 TAB PO q 5 pm and 1 tab po QHS 12/18/21   [name], PMHNP  rOPINIRole (REQUIP) 0.5 MG tablet Takes 4 tabs at 5 pm and 3 tabs at bedtime and 1-2 tabs prn 12/18/21   [name], PMHNP  vancomycin (VANCOCIN) 125 MG capsule Take 1 capsule (125 mg total) by mouth 2 (two) times daily. 11/12/21   Danford, Suann Larry, MD  Wheat Dextrin (BENEFIBER PO) Take 1 Dose by mouth daily. Patient not taking: Reported on 12/18/2021    [provider]      Allergies    Codeine, Procaine hcl, Epinephrine, Aspirin, Augmentin [amoxicillin-pot clavulanate], Benadryl [diphenhydramine], Diflucan [fluconazole], Dilaudid [hydromorphone hcl], Hydromorphone, Morphine, Other, Prednisone, Tramadol hcl, and Sulfa antibiotics    Review of Systems   Review of Systems  Musculoskeletal:   Positive for back pain.    Physical Exam Updated Vital Signs BP 121/82 (BP Location: Left Arm)   Pulse 82   Temp 98.3 F (36.8 C) (Oral)   Resp 18   Ht '5\' 1"'$  (1.549 m)   Wt 85.3 kg   SpO2 94%   BMI 35.52 kg/m  Physical Exam Vitals and nursing note reviewed.  Cardiovascular:     Rate and Rhythm: Regular rhythm.  Abdominal:     Tenderness: There is no abdominal tenderness.  Musculoskeletal:        General: Tenderness present.     Cervical back: Neck supple.     Comments: Some lumbar paraspinal muscular tenderness.  Particularly on the left side.  Some pain with straight leg raise.  Strength and sensation grossly intact in lower extremities however.  Neurological:     Mental Status: She is alert.     ED Results / Procedures / Treatments   Labs (all labs ordered are listed, but only abnormal results are displayed) Labs Reviewed - No data to display  EKG None  Radiology No results found.  Procedures Procedures    Medications Ordered in ED Medications  ketorolac (TORADOL) 15 MG/ML injection 15 mg (15 mg Intramuscular Given 12/28/21 0728)  fentaNYL (SUBLIMAZE) injection 50 mcg (50 mcg Intramuscular Given 12/28/21 5400)    ED Course/ Medical Decision Making/ A&P                           Medical Decision Making Risk Prescription drug management.   Patient with low back pain.  Somewhat acute on chronic.  Differential diagnosis includes musculoskeletal pain and much less likely causes such as fracture or perispinal abscess.  Do not think we need much diagnostic work-up at this time.  No fall or definite injury so fracture felt less likely.  Will treat symptomatically and treat with a single dose of Toradol and a  small dose of fentanyl.  Already has muscle relaxers with Tylenol at home.  Do not think we need to escalate pain control to narcotics.  Should be able to discharge home follow-up as an outpatient.        Final Clinical Impression(s) / ED  Diagnoses Final diagnoses:  Bilateral low back pain without sciatica, unspecified chronicity    Rx / DC Orders ED Discharge Orders     None         Davonna Belling, MD 12/28/21 1527

## 2021-12-30 ENCOUNTER — Ambulatory Visit (INDEPENDENT_AMBULATORY_CARE_PROVIDER_SITE_OTHER): Payer: Medicare Other | Admitting: Endocrinology

## 2021-12-30 ENCOUNTER — Encounter: Payer: Self-pay | Admitting: Endocrinology

## 2021-12-30 VITALS — BP 124/78 | HR 78 | Ht 61.0 in | Wt 189.0 lb

## 2021-12-30 DIAGNOSIS — E782 Mixed hyperlipidemia: Secondary | ICD-10-CM

## 2021-12-30 DIAGNOSIS — E1165 Type 2 diabetes mellitus with hyperglycemia: Secondary | ICD-10-CM | POA: Diagnosis not present

## 2021-12-30 DIAGNOSIS — Z794 Long term (current) use of insulin: Secondary | ICD-10-CM

## 2021-12-30 DIAGNOSIS — E039 Hypothyroidism, unspecified: Secondary | ICD-10-CM

## 2021-12-30 NOTE — Patient Instructions (Addendum)
Check blood sugars on waking up   Also check blood sugars about 2 hours after meals and do this after different meals by rotation  Recommended blood sugar levels on waking up are 90-130 and about 2 hours after meal is 130-160  Please bring your blood sugar monitor to each visit, thank you  Lantus 70 at nite  Fish oil 2x daily

## 2021-12-30 NOTE — Progress Notes (Signed)
Patient ID: [name], female   DOB: 21-Aug-1945, 76 y.o.   MRN: [id number]            Reason for Appointment: Follow-up    History of Present Illness:          Date of diagnosis of type 2 diabetes mellitus: ?  2014        Background history:   She thinks her blood sugar was 400 at the time of diagnosis but no detailed records of this are available She did have an A1c of 10.6 done in 2014 and was probably given Lantus for some time initially Apparently she was treated with various medications including metformin, Janumet and Tradgenta. Her blood sugars had improved and A1c in 2015 was down to 6.2 She tends to have diarrhea with metformin and Janumet and also she thinks it causes dry mouth Most likely Janumet was stopped in 06/2015 Glipizide was started in 8/17 when blood sugars were higher and A1c was 9%  Recent history:   INSULIN regimen is:   Lantus 68 units daily at 5 pm, Humalog 10-12 units at meals  Non-insulin hypoglycemic drugs the patient is taking are: Currently none  Her A1c is 6.8,   Current management, blood sugar patterns and problems identified:  She was started on Byetta on last visit about 4 weeks ago but she felt it was causing nausea and diarrhea and did not continue, only was taking 1 shot before dinner She was told to take Humalog regardless of Premeal blood sugar but she is generally taking it only twice a day Frequently will still forget to take it before eating and may take it after the blood sugar goes up Blood sugars are slightly better compared to her last visit and 74% within target range compared to 69 However appears that she has mostly high readings overnight averaging about 160+; the last 2 mornings of blood sugars in the mornings have been closer to 100 No hypoglycemia and overall blood sugars are not rising consistently after meals but still has some meal spikes either at breakfast or lunch AVERAGE blood sugars after meals are mostly in the  170 range and somewhat lower at about 152 after dinner She is again complaining of feeling excessively hungry and will not be controlling portions or carbohydrates Not able to lose weight as before She is not able to do any exercise    Side effects from medications have been:?  Diarrhea from metformin and Janumet, Jardiance caused yeast infections, abdominal discomfort from Ozempic   CGM use % of time 69  2-week average/GV 162/21  Time in range    74    %  % Time Above 180 25  % Time above 250 1  % Time Below 70 0     Previously:  CGM use % of time 90  2-week average/GV 167  Time in range 69   %  % Time Above 180 29+2  % Time above 250   % Time Below 70 0     Previous data:  CGM use % of time 79  2-week average/GV 139  Time in range    90    %  % Time Above 180 10  % Time above 250   % Time Below 70      PRE-MEAL Fasting Lunch Dinner Bedtime Overall  Glucose range:       Averages: 127    139   POST-MEAL PC Breakfast PC Lunch PC Dinner  Glucose range:     Averages: 137 149 154      Typical meal intake: Breakfast at 9,  May be eggs and sausage and lunch is usually a sandwich   Dinner 7 pm               Dietician visit, most recent: 10/18                Weight history:   Wt Readings from Last 3 Encounters:  12/30/21 189 lb (85.7 kg)  12/28/21 188 lb (85.3 kg)  12/12/21 188 lb 0.8 oz (85.3 kg)    Glycemic control:   Lab Results  Component Value Date   HGBA1C 6.8 (H) 12/26/2021   HGBA1C 6.8 (H) 10/06/2021   HGBA1C 6.7 (A) 07/16/2021   Lab Results  Component Value Date   MICROALBUR <0.7 12/26/2021   LDLCALC 79 08/09/2020   CREATININE 0.87 12/26/2021   CREATININE 0.87 12/26/2021   Lab Results  Component Value Date   MICRALBCREAT 1.6 12/26/2021       Allergies as of 12/30/2021       Reactions   Codeine Anxiety, Other (See Comments)   Hallucinations, tolerates oxycodone    Procaine Hcl Palpitations   Epinephrine    Other reaction(s):  Other (See Comments) Makes heart race at the dentist Makes heart race at the dentist   Aspirin Nausea And Vomiting, Other (See Comments)   Reaction:  Burns pts stomach    Augmentin [amoxicillin-pot Clavulanate] Diarrhea   Benadryl [diphenhydramine] Other (See Comments)   Per MD "inhibits potency of gabapentin, lithium etc"   Diflucan [fluconazole] Other (See Comments)   Unknown reaction   Dilaudid [hydromorphone Hcl] Other (See Comments)   Migraines and nightmares    Hydromorphone Other (See Comments)   Morphine Other (See Comments)   headache   Other Nausea And Vomiting, Other (See Comments)   Pt states that all -mycins cause N/V. (MACROLIDES)    Prednisone    Long term steroid problems with lithium and blood sugar.    Tramadol Hcl    Other reaction(s): felt weird   Sulfa Antibiotics Other (See Comments)   Unknown reaction        Medication List        Accurate as of December 30, 2021 10:29 AM. If you have any questions, ask your nurse or doctor.          acetaminophen 325 MG tablet Commonly known as: Tylenol Take 2 tablets (650 mg total) by mouth every 6 (six) hours as needed. What changed: reasons to take this   ALPRAZolam 0.5 MG tablet Commonly known as: XANAX Take 1 tablet (0.5 mg total) by mouth 4 (four) times daily as needed for anxiety.   atorvastatin 40 MG tablet Commonly known as: LIPITOR Take 40 mg by mouth every evening.   b complex vitamins capsule Take 1 capsule by mouth daily.   BD Pen Needle Nano 2nd Gen 32G X 4 MM Misc Generic drug: Insulin Pen Needle USE TO INJECT AS DIRECTED FIVE TIMES DAILY   BENEFIBER PO Take 1 Dose by mouth daily.   bismuth subsalicylate 629 BM/84XL suspension Commonly known as: PEPTO BISMOL Take 30 mLs by mouth every 6 (six) hours as needed.   Byetta 5 MCG Pen 5 MCG/0.02ML Sopn injection Generic drug: exenatide Inject 5 mcg into the skin 2 (two) times daily with a meal.   cholecalciferol 25 MCG (1000 UNIT)  tablet Commonly known as: VITAMIN D3 Take 1,000 Units by  mouth daily. Also takes 2,000 units capsule at bedtime   Contour Next Test test strip Generic drug: glucose blood USE TO TEST BLOOD SUGAR TWICE DAILY   dicyclomine 20 MG tablet Commonly known as: BENTYL Take 1 tablet (20 mg total) by mouth 3 (three) times daily as needed for spasms.   divalproex 250 MG DR tablet Commonly known as: DEPAKOTE Take 2 tablets (500 mg total) by mouth at bedtime.   donepezil 10 MG tablet Commonly known as: ARICEPT Take 1 tablet (10 mg total) by mouth at bedtime.   ezetimibe 10 MG tablet Commonly known as: Zetia Take 1 tablet (10 mg total) by mouth daily.   fluticasone 50 MCG/ACT nasal spray Commonly known as: FLONASE Place into both nostrils as needed for allergies or rhinitis.   gabapentin 600 MG tablet Commonly known as: NEURONTIN Take 600 mg by mouth 3 (three) times daily.   insulin lispro 100 UNIT/ML KwikPen Commonly known as: HumaLOG KwikPen ADMINISTER 10 UNITS UNDER THE SKIN BEFORE MEALS What changed:  how much to take how to take this when to take this reasons to take this additional instructions   Lantus SoloStar 100 UNIT/ML Solostar Pen Generic drug: insulin glargine ADMINISTER 66 UNITS UNDER THE SKIN AT BEDTIME   levothyroxine 75 MCG tablet Commonly known as: SYNTHROID TAKE 1 TABLET BY MOUTH EVERY DAY BEFORE BREAKFAST What changed: See the new instructions.   liothyronine 5 MCG tablet Commonly known as: CYTOMEL TAKE 1 TABLET BY MOUTH DAILY WITH LEVOTHYROXINE. TAKE BEFORE BREAKFAST What changed: See the new instructions.   lithium carbonate 450 MG CR tablet Commonly known as: ESKALITH Take 1 tablet (450 mg total) by mouth at bedtime.   loperamide 2 MG capsule Commonly known as: IMODIUM Take 2 capsules (4 mg total) by mouth 3 (three) times daily as needed for diarrhea or loose stools.   loratadine 10 MG tablet Commonly known as: CLARITIN Take 10 mg by mouth  daily as needed for allergies.   losartan 25 MG tablet Commonly known as: COZAAR Take 25 mg by mouth every evening.   memantine 10 MG tablet Commonly known as: NAMENDA TAKE 1 TABLET(10 MG) BY MOUTH EVERY EVENING   methocarbamol 500 MG tablet Commonly known as: ROBAXIN Take 1 tablet (500 mg total) by mouth every 8 (eight) hours as needed for muscle spasms.   Microlet Lancets Misc TEST DAILY AS DIRECTED   nitroGLYCERIN 0.4 MG SL tablet Commonly known as: NITROSTAT Place 1 tablet (0.4 mg total) under the tongue every 5 (five) minutes as needed for chest pain.   ondansetron 4 MG tablet Commonly known as: ZOFRAN Take 1 tablet (4 mg total) by mouth every 6 (six) hours.   oxyCODONE 5 MG immediate release tablet Commonly known as: Roxicodone Take 1 tablet (5 mg total) by mouth every 6 (six) hours as needed for up to 10 doses for breakthrough pain.   oxyCODONE-acetaminophen 5-325 MG tablet Commonly known as: PERCOCET/ROXICET Take 1 tablet by mouth every 6 (six) hours as needed for severe pain.   PEG-3350/Electrolytes 236 g Solr Take 17 g by mouth daily as needed.   phenazopyridine 200 MG tablet Commonly known as: PYRIDIUM Take 1 tablet (200 mg total) by mouth 3 (three) times daily.   QUEtiapine 200 MG tablet Commonly known as: SEROQUEL TAKE 1 TAB PO q 5 pm and 1 tab po QHS   rOPINIRole 0.5 MG tablet Commonly known as: REQUIP Takes 4 tabs at 5 pm and 3 tabs at bedtime and 1-2 tabs  prn   vancomycin 125 MG capsule Commonly known as: VANCOCIN Take 1 capsule (125 mg total) by mouth 2 (two) times daily.        Allergies:  Allergies  Allergen Reactions   Codeine Anxiety and Other (See Comments)    Hallucinations, tolerates oxycodone    Procaine Hcl Palpitations   Epinephrine     Other reaction(s): Other (See Comments) Makes heart race at the dentist Makes heart race at the dentist    Aspirin Nausea And Vomiting and Other (See Comments)    Reaction:  Burns pts  stomach    Augmentin [Amoxicillin-Pot Clavulanate] Diarrhea   Benadryl [Diphenhydramine] Other (See Comments)    Per MD "inhibits potency of gabapentin, lithium etc"   Diflucan [Fluconazole] Other (See Comments)    Unknown reaction    Dilaudid [Hydromorphone Hcl] Other (See Comments)    Migraines and nightmares    Hydromorphone Other (See Comments)   Morphine Other (See Comments)    headache   Other Nausea And Vomiting and Other (See Comments)    Pt states that all -mycins cause N/V. (MACROLIDES)    Prednisone     Long term steroid problems with lithium and blood sugar.    Tramadol Hcl     Other reaction(s): felt weird   Sulfa Antibiotics Other (See Comments)    Unknown reaction    Past Medical History:  Diagnosis Date   Alcohol abuse    Anxiety    takes Valium daily as needed and Ativan daily   Bilateral hearing loss    Bipolar 1 disorder ([place])    takes Lithium nightly and Synthroid daily   Chronic back pain    DDD; "all over" (09/14/2017)   Colitis, ischemic ([place]) 2012   Confusion    r/t meds   Depression    takes Prozac daily and Bupspirone    Diverticulosis    Dyslipidemia    takes Crestor daily   Fibromyalgia    Gastroparesis    Headache    "weekly" (09/14/2017)   Hepatic steatosis 06/18/12   severe   Hyperlipidemia    Hypertension    Hypothyroidism    IBS (irritable bowel syndrome)    Ischemic colitis ([place])    Joint pain    Joint swelling    Lupus erythematosus tumidus    tumid-skin   Migraine    "1-2/yr; maybe" (09/14/2017)   Numbness    in right foot   Osteoarthritis    "all over" (09/14/2017)   Osteoarthritis cervical spine    Osteoarthritis of hand    bilateral   Pneumonia    "walking pneumonia several times; long time since the last time" (09/14/2017)   Restless leg syndrome    takes Requip nightly   Sciatica    Type II diabetes mellitus ([place])    Urinary frequency    Urinary leakage    Urinary urgency    Urinary, incontinence, stress female     Walking pneumonia    last time more than 8yr ago    Past Surgical History:  Procedure Laterality Date   ABDOMINAL HYSTERECTOMY     "they left my ovaries"   APPENDECTOMY     BALLOON DILATION N/A 06/14/2020   Procedure: BALLOON DILATION;  Surgeon: KRonnette Juniper MD;  Location: [facility];  Service: Gastroenterology;  Laterality: N/A;   BIOPSY  06/14/2020   Procedure: BIOPSY;  Surgeon: KRonnette Juniper MD;  Location: [facility];  Service: Gastroenterology;;   CARDIAC CATHETERIZATION  09/14/2017  COLON RESECTION  05/2021   COLONOSCOPY     DENTAL SURGERY Left 10/2016   dental implant   DILATION AND CURETTAGE OF UTERUS  X 4   ESOPHAGOGASTRODUODENOSCOPY     ESOPHAGOGASTRODUODENOSCOPY (EGD) WITH PROPOFOL N/A 06/14/2020   Procedure: ESOPHAGOGASTRODUODENOSCOPY (EGD) WITH PROPOFOL;  Surgeon: Ronnette Juniper, MD;  Location: [facility];  Service: Gastroenterology;  Laterality: N/A;   FLEXIBLE SIGMOIDOSCOPY N/A 06/21/2012   Procedure: FLEXIBLE SIGMOIDOSCOPY;  Surgeon: Jerene Bears, MD;  Location: [facility];  Service: Gastroenterology;  Laterality: N/A;   JOINT REPLACEMENT     LEFT HEART CATH AND CORONARY ANGIOGRAPHY N/A 09/14/2017   Procedure: LEFT HEART CATH AND CORONARY ANGIOGRAPHY;  Surgeon: Belva Crome, MD;  Location: [facility];  Service: Cardiovascular;  Laterality: N/A;   SHOULDER ARTHROSCOPY Right    "shaved spurs off rotator cuff"   TONSILLECTOMY     TOTAL HIP ARTHROPLASTY Right 06/09/2013   Procedure: TOTAL HIP ARTHROPLASTY;  Surgeon: Kerin Salen, MD;  Location: [facility];  Service: Orthopedics;  Laterality: Right;   TOTAL KNEE ARTHROPLASTY Left 02/07/2019   Procedure: TOTAL KNEE ARTHROPLASTY;  Surgeon: Paralee Cancel, MD;  Location: [facility];  Service: Orthopedics;  Laterality: Left;  70 mins   TUBAL LIGATION     TUMOR EXCISION Right 1968   angle of jaw; benign    Family History  Problem Relation Age of Onset   Drug abuse Mother    Alcohol abuse Mother    Alcohol abuse  Father    Hypertension Father    CAD Brother    Hypertension Brother    Alcohol abuse Brother    Hypertension Brother    Hypertension Brother    Psoriasis Daughter    Arthritis Daughter        psoriatic arthritis    Alcohol abuse Grandchild     Social History:  reports that she has never smoked. She has never used smokeless tobacco. She reports that she does not currently use alcohol. She reports that she does not currently use drugs after having used the following drugs: Benzodiazepines. Frequency: 7.00 times per week.   Review of Systems    Lipid history: On Lipitor 40 mg   Triglycerides over 400 and now better LDL high previously and Zetia was added  Not Taking OTC fish oil as she forgot to order   Lab Results  Component Value Date   CHOL 168 12/26/2021   CHOL 257 (H) 10/06/2021   CHOL 172 08/09/2020   Lab Results  Component Value Date   HDL 60.90 12/26/2021   HDL 56.70 10/06/2021   HDL 56.90 08/09/2020   Lab Results  Component Value Date   LDLCALC 79 08/09/2020   Lab Results  Component Value Date   TRIG 234.0 (H) 12/26/2021   TRIG (H) 10/06/2021    406.0 Triglyceride is over 400; calculations on Lipids are invalid.   TRIG 180.0 (H) 08/09/2020   Lab Results  Component Value Date   CHOLHDL 3 12/26/2021   CHOLHDL 5 10/06/2021   CHOLHDL 3 08/09/2020   Lab Results  Component Value Date   LDLDIRECT 84.0 12/26/2021   LDLDIRECT 148.0 10/06/2021   LDLDIRECT 137.0 12/04/2019            ?  Hypertension: Currently only on 25 mg losartan  BP Readings from Last 3 Encounters:  12/30/21 124/78  12/28/21 121/82  12/12/21 (!) 167/87     Most recent foot exam: 6/21  THYROID: She previously had been  on levothyroxine and Cytomel from her psychiatrist for several years with uncertain diagnosis She is taking levothyroxine 75 g, Also on liothyronine 5 mcg, previously T3 level was low when this was stopped  Also on lithium  Her TSH is consistently  normal Free T3 and free T4 also normal  Labs as follows:  Lab Results  Component Value Date   TSH 0.67 10/06/2021   TSH 0.925 07/06/2021   TSH 1.04 03/25/2021   FREET4 0.76 10/06/2021   FREET4 0.68 07/06/2021   FREET4 0.93 03/25/2021   Lab Results  Component Value Date   T3FREE 2.8 10/06/2021   T3FREE 3.2 03/25/2021   T3FREE 2.2 (L) 12/24/2020   T3FREE 3.2 08/28/2019   T3FREE 2.7 04/26/2019    She has atypical depression on multiple drugs, has been on Seroquel for quite some time  THYROID nodule:  She was found to have a nodule in her thyroid isthmus in 8/20 Ultrasound done in 1/21 showed multinodular goiter with only 1 significant nodule   However nodule in the isthmus was biopsied on the recommendation of her PCP in 3/22 and that showed benign follicular nodule  She has had mild/variable hypercalcemia, last levels:  Lab Results  Component Value Date   PTH 35 10/25/2018   CALCIUM 10.4 12/26/2021   CALCIUM 10.4 12/26/2021   CAION 1.30 07/14/2016   PHOS 2.9 12/26/2010   She reportedly has GASTROPARESIS  Currently not on any treatment Taking medications as needed for nausea  She is on Namenda and Aricept for dementia  Physical Examination:  BP 124/78   Pulse 78   Ht '5\' 1"'$  (1.549 m)   Wt 189 lb (85.7 kg)   SpO2 97%   BMI 35.71 kg/m         ASSESSMENT:  Diabetes type 2, on insulin  See history of present illness for detailed discussion of current diabetes management, blood sugar patterns, monitor download and problems identified  She is on 68 units Lantus insulin and also on mealtime insulin without any other hypoglycemic drugs She is intolerant to GLP-1 drugs including Byetta  Her A1c is at 6.8 again  She is taking her Premeal Humalog more often but not consistently However now appears to have mostly higher overnight blood sugars except in the last couple of days She is generally not able to control her diet or lose weight and currently unable to  exercise  She has 74 % blood sugars within target, slightly better  Microalbumin normal  Lipids are better controlled with adding Zetia, still has high triglycerides  PLAN:  She will try to work on her diet Increase Lantus to at least 70 units for now If morning sugars are again rising may need a higher dose Unlikely that she will be able to handle insulin pump but may consider doing this on the next visit using the OmniPod Also needs to remind herself to take insulin for meals consistently before eating  Reminded her to order the fish oil capsules and take them twice a day  There are no Patient Instructions on file for this visit.     Elayne Snare 12/30/2021, 10:29 AM   Note: This office note was prepared with Dragon voice recognition system technology. Any transcriptional errors that result from this process are unintentional.

## 2022-01-02 ENCOUNTER — Telehealth: Payer: Self-pay | Admitting: *Deleted

## 2022-01-02 DIAGNOSIS — N3000 Acute cystitis without hematuria: Secondary | ICD-10-CM | POA: Diagnosis not present

## 2022-01-02 DIAGNOSIS — Z23 Encounter for immunization: Secondary | ICD-10-CM | POA: Diagnosis not present

## 2022-01-02 NOTE — Telephone Encounter (Signed)
     Patient  visit on 12/28/2021  at [facility] was for pain  Have you been able to follow up with your primary care physician? yes The patient was able to obtain any needed medicine or equipment.  Are there diet recommendations that you are having difficulty following?  Patient expresses understanding of discharge instructions and education provided has no other needs at this time.   [facility] [phone] [address] , [place] [address] Email : [email or web address]

## 2022-01-05 ENCOUNTER — Encounter: Payer: Self-pay | Admitting: Endocrinology

## 2022-01-07 DIAGNOSIS — R300 Dysuria: Secondary | ICD-10-CM | POA: Diagnosis not present

## 2022-01-07 DIAGNOSIS — N3001 Acute cystitis with hematuria: Secondary | ICD-10-CM | POA: Diagnosis not present

## 2022-01-16 DIAGNOSIS — A0471 Enterocolitis due to Clostridium difficile, recurrent: Secondary | ICD-10-CM | POA: Diagnosis not present

## 2022-01-21 ENCOUNTER — Ambulatory Visit (INDEPENDENT_AMBULATORY_CARE_PROVIDER_SITE_OTHER): Payer: Medicare Other | Admitting: Psychiatry

## 2022-01-21 ENCOUNTER — Encounter: Payer: Self-pay | Admitting: Psychiatry

## 2022-01-21 DIAGNOSIS — F319 Bipolar disorder, unspecified: Secondary | ICD-10-CM

## 2022-01-21 DIAGNOSIS — F411 Generalized anxiety disorder: Secondary | ICD-10-CM | POA: Diagnosis not present

## 2022-01-21 DIAGNOSIS — F5101 Primary insomnia: Secondary | ICD-10-CM | POA: Diagnosis not present

## 2022-01-21 DIAGNOSIS — G2581 Restless legs syndrome: Secondary | ICD-10-CM | POA: Diagnosis not present

## 2022-01-21 MED ORDER — ROPINIROLE HCL 0.5 MG PO TABS: ORAL_TABLET | ORAL | 0 refills | Status: DC

## 2022-01-21 MED ORDER — MEMANTINE HCL 10 MG PO TABS: ORAL_TABLET | ORAL | 0 refills | Status: DC

## 2022-01-21 MED ORDER — DONEPEZIL HCL 10 MG PO TABS: 10.0000 mg | ORAL_TABLET | Freq: Every day | ORAL | 0 refills | Status: DC

## 2022-01-21 MED ORDER — ALPRAZOLAM 0.5 MG PO TABS: 0.5000 mg | ORAL_TABLET | Freq: Four times a day (QID) | ORAL | 2 refills | Status: DC | PRN

## 2022-01-21 MED ORDER — QUETIAPINE FUMARATE 200 MG PO TABS: ORAL_TABLET | ORAL | 0 refills | Status: DC

## 2022-01-21 MED ORDER — LITHIUM CARBONATE ER 450 MG PO TBCR: 450.0000 mg | EXTENDED_RELEASE_TABLET | Freq: Every day | ORAL | 0 refills | Status: DC

## 2022-01-21 MED ORDER — DIVALPROEX SODIUM 250 MG PO DR TAB: DELAYED_RELEASE_TABLET | ORAL | 0 refills | Status: DC
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
## 2022-01-25 ENCOUNTER — Other Ambulatory Visit: Payer: Self-pay | Admitting: Endocrinology

## 2022-01-25 DIAGNOSIS — Z794 Long term (current) use of insulin: Secondary | ICD-10-CM | POA: Insufficient documentation

## 2022-01-25 DIAGNOSIS — Z79899 Other long term (current) drug therapy: Secondary | ICD-10-CM | POA: Insufficient documentation

## 2022-01-25 DIAGNOSIS — N390 Urinary tract infection, site not specified: Secondary | ICD-10-CM | POA: Diagnosis not present

## 2022-01-25 DIAGNOSIS — K5900 Constipation, unspecified: Secondary | ICD-10-CM | POA: Insufficient documentation

## 2022-01-25 DIAGNOSIS — E119 Type 2 diabetes mellitus without complications: Secondary | ICD-10-CM

## 2022-01-25 DIAGNOSIS — R109 Unspecified abdominal pain: Secondary | ICD-10-CM | POA: Diagnosis present

## 2022-01-26 ENCOUNTER — Emergency Department (HOSPITAL_BASED_OUTPATIENT_CLINIC_OR_DEPARTMENT_OTHER)
Admission: EM | Admit: 2022-01-26 | Discharge: 2022-01-26 | Disposition: A | Discharge status: Home / Self Care | Payer: Medicare Other | Attending: Emergency Medicine | Admitting: Emergency Medicine

## 2022-01-26 ENCOUNTER — Encounter (HOSPITAL_BASED_OUTPATIENT_CLINIC_OR_DEPARTMENT_OTHER): Payer: Self-pay | Admitting: Emergency Medicine

## 2022-01-26 ENCOUNTER — Other Ambulatory Visit: Payer: Self-pay

## 2022-01-26 ENCOUNTER — Emergency Department (HOSPITAL_BASED_OUTPATIENT_CLINIC_OR_DEPARTMENT_OTHER): Payer: Medicare Other

## 2022-01-26 DIAGNOSIS — K5900 Constipation, unspecified: Secondary | ICD-10-CM

## 2022-01-26 DIAGNOSIS — I700 Atherosclerosis of aorta: Secondary | ICD-10-CM | POA: Diagnosis not present

## 2022-01-26 DIAGNOSIS — N390 Urinary tract infection, site not specified: Secondary | ICD-10-CM

## 2022-01-26 DIAGNOSIS — N3000 Acute cystitis without hematuria: Secondary | ICD-10-CM | POA: Diagnosis not present

## 2022-01-26 DIAGNOSIS — R109 Unspecified abdominal pain: Secondary | ICD-10-CM | POA: Diagnosis not present

## 2022-01-26 DIAGNOSIS — N132 Hydronephrosis with renal and ureteral calculous obstruction: Secondary | ICD-10-CM | POA: Diagnosis not present

## 2022-01-26 LAB — CBC
HCT: 42.6 % (ref 36.0–46.0)
Hemoglobin: 13.6 g/dL (ref 12.0–15.0)
MCH: 29.3 pg (ref 26.0–34.0)
MCHC: 31.9 g/dL (ref 30.0–36.0)
MCV: 91.8 fL (ref 80.0–100.0)
Platelets: 192 10*3/uL (ref 150–400)
RBC: 4.64 MIL/uL (ref 3.87–5.11)
RDW: 13.2 % (ref 11.5–15.5)
WBC: 8.8 10*3/uL (ref 4.0–10.5)
nRBC: 0 % (ref 0.0–0.2)

## 2022-01-26 LAB — COMPREHENSIVE METABOLIC PANEL
ALT: 11 U/L (ref 0–44)
AST: 17 U/L (ref 15–41)
Albumin: 4.3 g/dL (ref 3.5–5.0)
Alkaline Phosphatase: 80 U/L (ref 38–126)
Anion gap: 11 (ref 5–15)
BUN: 9 mg/dL (ref 8–23)
CO2: 20 mmol/L — ABNORMAL LOW (ref 22–32)
Calcium: 10.1 mg/dL (ref 8.9–10.3)
Chloride: 107 mmol/L (ref 98–111)
Creatinine, Ser: 0.9 mg/dL (ref 0.44–1.00)
GFR, Estimated: >60 mL/min (ref >60)
Glucose, Bld: 195 mg/dL — ABNORMAL HIGH (ref 70–99)
Potassium: 4.5 mmol/L (ref 3.5–5.1)
Sodium: 138 mmol/L (ref 135–145)
Total Bilirubin: 0.4 mg/dL (ref 0.3–1.2)
Total Protein: 7 g/dL (ref 6.5–8.1)

## 2022-01-26 LAB — URINALYSIS, ROUTINE W REFLEX MICROSCOPIC
Bilirubin Urine: NEGATIVE
Glucose, UA: NEGATIVE mg/dL
Hgb urine dipstick: NEGATIVE
Ketones, ur: NEGATIVE mg/dL
Nitrite: NEGATIVE
Protein, ur: NEGATIVE mg/dL
Specific Gravity, Urine: 1.023 (ref 1.005–1.030)
WBC, UA: >50 WBC/hpf — ABNORMAL HIGH (ref 0–5)
pH: 6 (ref 5.0–8.0)

## 2022-01-26 LAB — LIPASE, BLOOD: Lipase: 38 U/L (ref 11–51)

## 2022-01-26 MED ORDER — NITROFURANTOIN MONOHYD MACRO 100 MG PO CAPS: 100.0000 mg | ORAL_CAPSULE | Freq: Two times a day (BID) | ORAL | 0 refills | Status: DC

## 2022-01-26 MED ORDER — FENTANYL CITRATE PF 50 MCG/ML IJ SOSY
50.0000 ug | PREFILLED_SYRINGE | Freq: Once | INTRAMUSCULAR | Status: AC
  Administered 2022-01-26: 50 ug via INTRAVENOUS
  Filled 2022-01-26: qty 1

## 2022-01-26 MED ORDER — ONDANSETRON HCL 4 MG/2ML IJ SOLN
4.0000 mg | Freq: Once | INTRAMUSCULAR | Status: AC
  Administered 2022-01-26: 4 mg via INTRAVENOUS
  Filled 2022-01-26: qty 2

## 2022-01-26 MED ORDER — SODIUM CHLORIDE 0.9 % IV BOLUS
1000.0000 mL | Freq: Once | INTRAVENOUS | Status: AC
  Administered 2022-01-26: 1000 mL via INTRAVENOUS

## 2022-01-26 MED ORDER — IOHEXOL 300 MG/ML  SOLN
100.0000 mL | Freq: Once | INTRAMUSCULAR | Status: AC | PRN
  Administered 2022-01-26: 100 mL via INTRAVENOUS

## 2022-01-26 NOTE — ED Notes (Signed)
Pt. Is up for discharge, but can't call her husband until 6:30 am to take her home. Dr.Sheldon is aware. IV fluids infusing without difficulty.

## 2022-01-26 NOTE — ED Triage Notes (Signed)
Pt c/o abdominal pain and constipation x 1 week.

## 2022-01-26 NOTE — ED Notes (Signed)
It was explained to patient that she could not be discharged and drive herself home until 4 hours after receiving an IV narcotic. Patient states she will just wait the 4 hours instead of calling her husband now. Dr.Sheldon and Tyler Deis chg nurse aware.

## 2022-01-26 NOTE — ED Provider Notes (Signed)
[facility] DEPT  Provider Note  CSN: [id number] Arrival date & time: 01/25/22 2352  History Chief Complaint  Patient presents with   Abdominal Pain    [name] [name] is a 76 y.o. female with history of partial colectomy for recurrent diverticulitis and recurrent UTI reports several days of L sided abdominal pain, bloating, constipation and dysuria. She was diagnosed with UTI at [facility] and given Rx for Keflex which has not helped. No fevers. No vomiting. Some nausea.    Home Medications Prior to Admission medications   Medication Sig Start Date End Date Taking? Authorizing Provider  nitrofurantoin, macrocrystal-monohydrate, (MACROBID) 100 MG capsule Take 1 capsule (100 mg total) by mouth 2 (two) times daily. 01/26/22  Yes Truddie Hidden, MD  acetaminophen (TYLENOL) 325 MG tablet Take 2 tablets (650 mg total) by mouth every 6 (six) hours as needed. Patient taking differently: Take 650 mg by mouth every 6 (six) hours as needed for mild pain. 02/11/21   Jeanell Sparrow, DO  ALPRAZolam Duanne Moron) 0.5 MG tablet Take 1 tablet (0.5 mg total) by mouth 4 (four) times daily as needed for anxiety. 02/18/22   [name], PMHNP  atorvastatin (LIPITOR) 40 MG tablet Take 40 mg by mouth every evening.    [provider]  b complex vitamins capsule Take 1 capsule by mouth daily.    [provider]  BD PEN NEEDLE NANO 2ND GEN 32G X 4 MM MISC USE TO INJECT AS DIRECTED FIVE TIMES DAILY 10/15/21   Elayne Snare, MD  bismuth subsalicylate (PEPTO BISMOL) 262 MG/15ML suspension Take 30 mLs by mouth every 6 (six) hours as needed.    [provider]  cholecalciferol (VITAMIN D3) 25 MCG (1000 UNIT) tablet Take 1,000 Units by mouth daily. Also takes 2,000 units capsule at bedtime    [provider]  CONTOUR NEXT TEST test strip USE TO TEST BLOOD SUGAR TWICE DAILY 08/01/21   Elayne Snare, MD  cyclobenzaprine (FLEXERIL) 5 MG tablet Take 5 mg by mouth 3 (three)  times daily as needed for muscle spasms.    [provider]  dicyclomine (BENTYL) 20 MG tablet Take 1 tablet (20 mg total) by mouth 3 (three) times daily as needed for spasms. 06/17/21   Shelly Coss, MD  divalproex (DEPAKOTE) 250 MG DR tablet Take 1 tablet (250 mg total) by mouth every morning AND 2 tablets (500 mg total) at bedtime. 01/21/22 04/21/22  [name], PMHNP  donepezil (ARICEPT) 10 MG tablet Take 1 tablet (10 mg total) by mouth at bedtime. 01/21/22   [name], PMHNP  exenatide (BYETTA 5 MCG PEN) 5 MCG/0.02ML SOPN injection Inject 5 mcg into the skin 2 (two) times daily with a meal. Patient not taking: Reported on 01/21/2022 11/26/21   Elayne Snare, MD  ezetimibe (ZETIA) 10 MG tablet Take 1 tablet (10 mg total) by mouth daily. 10/07/21   Elayne Snare, MD  fluticasone Asencion Islam) 50 MCG/ACT nasal spray Place into both nostrils as needed for allergies or rhinitis.    [provider]  gabapentin (NEURONTIN) 600 MG tablet Take 600 mg by mouth 3 (three) times daily. 05/12/19   [provider]  ibuprofen (ADVIL) 800 MG tablet Take 800 mg by mouth every 8 (eight) hours as needed for moderate pain (following dental implants).    [provider]  insulin lispro (HUMALOG KWIKPEN) 100 UNIT/ML KwikPen ADMINISTER 10 UNITS UNDER THE SKIN BEFORE MEALS Patient taking differently: Inject 10 Units into the skin 3 (  three) times daily as needed (high blood sugar). 10 units before breakfast and lunch and 12 units before dinner 05/30/21   Elayne Snare, MD  LANTUS SOLOSTAR 100 UNIT/ML Solostar Pen ADMINISTER 66 UNITS UNDER THE SKIN AT BEDTIME 12/12/21   Elayne Snare, MD  levothyroxine (SYNTHROID) 75 MCG tablet TAKE 1 TABLET BY MOUTH EVERY DAY BEFORE BREAKFAST Patient taking differently: Take 75 mcg by mouth daily before breakfast. 03/27/21   Elayne Snare, MD  liothyronine (CYTOMEL) 5 MCG tablet TAKE 1 TABLET BY MOUTH DAILY WITH LEVOTHYROXINE. TAKE BEFORE BREAKFAST Patient  taking differently: Take 5 mcg by mouth daily. 03/27/21   Elayne Snare, MD  lithium carbonate (ESKALITH) 450 MG CR tablet Take 1 tablet (450 mg total) by mouth at bedtime. 01/21/22   [name], PMHNP  loperamide (IMODIUM) 2 MG capsule Take 2 capsules (4 mg total) by mouth 3 (three) times daily as needed for diarrhea or loose stools. 11/12/21   Danford, Suann Larry, MD  loratadine (CLARITIN) 10 MG tablet Take 10 mg by mouth daily as needed for allergies.    [provider]  losartan (COZAAR) 25 MG tablet Take 25 mg by mouth every evening. 08/31/19   [provider]  memantine (NAMENDA) 10 MG tablet TAKE 1 TABLET(10 MG) BY MOUTH EVERY EVENING 01/21/22   [name], PMHNP  methocarbamol (ROBAXIN) 500 MG tablet Take 1 tablet (500 mg total) by mouth every 8 (eight) hours as needed for muscle spasms. Patient not taking: Reported on 01/21/2022 11/26/21   Ezequiel Essex, MD  Microlet Lancets MISC TEST DAILY AS DIRECTED 08/01/21   Elayne Snare, MD  nitroGLYCERIN (NITROSTAT) 0.4 MG SL tablet Place 1 tablet (0.4 mg total) under the tongue every 5 (five) minutes as needed for chest pain. 09/14/17   Lyda Jester M, PA-C  ondansetron (ZOFRAN) 4 MG tablet Take 1 tablet (4 mg total) by mouth every 6 (six) hours. 09/20/21   Curatolo, Adam, DO  oxyCODONE (ROXICODONE) 5 MG immediate release tablet Take 1 tablet (5 mg total) by mouth every 6 (six) hours as needed for up to 10 doses for breakthrough pain. Patient not taking: Reported on 01/21/2022 07/04/21   Lennice Sites, DO  oxyCODONE-acetaminophen (PERCOCET/ROXICET) 5-325 MG tablet Take 1 tablet by mouth every 6 (six) hours as needed for severe pain. Patient not taking: Reported on 01/21/2022 06/17/21   Shelly Coss, MD  PEG 3350-KCl-NaBcb-NaCl-NaSulf (PEG-3350/ELECTROLYTES) 236 g SOLR Take 17 g by mouth daily as needed. 10/06/21   [provider]  phenazopyridine (PYRIDIUM) 200 MG tablet Take 1 tablet (200 mg total) by mouth 3  (three) times daily. Patient not taking: Reported on 01/21/2022 12/12/21   Wynona Dove A, DO  QUEtiapine (SEROQUEL) 200 MG tablet TAKE 1 TAB PO q 5 pm and 1 tab po QHS 01/21/22   [name], PMHNP  rOPINIRole (REQUIP) 0.5 MG tablet Takes 4 tabs at 5 pm and 3 tabs at bedtime and 1-2 tabs prn 01/21/22   [name], PMHNP  vancomycin (VANCOCIN) 125 MG capsule Take 1 capsule (125 mg total) by mouth 2 (two) times daily. Patient not taking: Reported on 01/21/2022 11/12/21   Edwin Dada, MD  Wheat Dextrin (BENEFIBER PO) Take 1 Dose by mouth daily. Patient not taking: Reported on 01/21/2022    [provider]     Allergies    Codeine, Procaine hcl, Epinephrine, Aspirin, Augmentin [amoxicillin-pot clavulanate], Benadryl [diphenhydramine], Diflucan [fluconazole], Dilaudid [hydromorphone hcl], Hydromorphone, Morphine, Other, Prednisone, Tramadol hcl, and Sulfa antibiotics   Review  of Systems   Review of Systems Please see HPI for pertinent positives and negatives  Physical Exam BP 127/78   Pulse 92   Temp 98.3 F (36.8 C)   Resp 18   Ht '5\' 1"'$  (1.549 m)   Wt 85.7 kg   SpO2 94%   BMI 35.70 kg/m   Physical Exam Vitals and nursing note reviewed.  Constitutional:      Appearance: Normal appearance.  HENT:     Head: Normocephalic and atraumatic.     Nose: Nose normal.     Mouth/Throat:     Mouth: Mucous membranes are moist.  Eyes:     Extraocular Movements: Extraocular movements intact.     Conjunctiva/sclera: Conjunctivae normal.  Cardiovascular:     Rate and Rhythm: Normal rate.  Pulmonary:     Effort: Pulmonary effort is normal.     Breath sounds: Normal breath sounds.  Abdominal:     General: Abdomen is flat.     Palpations: Abdomen is soft.     Tenderness: There is abdominal tenderness in the right lower quadrant and left lower quadrant. There is no guarding. Negative signs include Murphy's sign and McBurney's sign.  Musculoskeletal:         General: No swelling. Normal range of motion.     Cervical back: Neck supple.  Skin:    General: Skin is warm and dry.  Neurological:     General: No focal deficit present.     Mental Status: She is alert.  Psychiatric:        Mood and Affect: Mood normal.     ED Results / Procedures / Treatments   EKG None  Procedures Procedures  Medications Ordered in the ED Medications  sodium chloride 0.9 % bolus 1,000 mL (1,000 mLs Intravenous New Bag/Given 01/26/22 0243)  fentaNYL (SUBLIMAZE) injection 50 mcg (50 mcg Intravenous Given 01/26/22 0242)  ondansetron (ZOFRAN) injection 4 mg (4 mg Intravenous Given 01/26/22 0241)  iohexol (OMNIPAQUE) 300 MG/ML solution 100 mL (100 mLs Intravenous Contrast Given 01/26/22 0244)    Initial Impression and Plan  Patient here with LLQ pain, concerning for recurrence of diverticulitis, could also be SBO, UTI or constipation. Labs done in triage show normal CBC, CMP and Lipase. UA is concerning for continued UTI. Will send for CT to eval pain. Fentanyl/Zofran for symptoms control.   ED Course   Clinical Course as of 01/26/22 0336  Mon Jan 26, 2022  0332 I personally viewed the images from radiology studies and agree with radiologist interpretation:  CT neg for acute infectious or obstructive process in bowel. No signs of pyelo. She has a chronically obstructed ureter which may account for her recurrent UTI, but that is on the right side and does not seem to be contributing to her symptoms today. Will switch from Keflex to [place]. Recommend outpatient PCP and Urology follow up.  [CS]    Clinical Course User Index [CS] Truddie Hidden, MD     MDM Rules/Calculators/A&P Medical Decision Making Given presenting complaint, I considered that admission might be necessary. After review of results from ED lab and/or imaging studies, admission to the hospital is not indicated at this time.    Problems Addressed: Constipation, unspecified  constipation type: acute illness or injury Recurrent UTI: chronic illness or injury with exacerbation, progression, or side effects of treatment  Amount and/or Complexity of Data Reviewed Labs: ordered. Decision-making details documented in ED Course. Radiology: ordered and independent interpretation performed. Decision-making details documented in  ED Course.  Risk Prescription drug management. Parenteral controlled substances. Decision regarding hospitalization.    Final Clinical Impression(s) / ED Diagnoses Final diagnoses:  Recurrent UTI  Constipation, unspecified constipation type    Rx / DC Orders ED Discharge Orders          Ordered    nitrofurantoin, macrocrystal-monohydrate, (MACROBID) 100 MG capsule  2 times daily        01/26/22 0336             Truddie Hidden, MD 01/26/22 [phone]

## 2022-01-26 NOTE — Discharge Instructions (Addendum)
Stop taking your Keflex as it does not seem to be helping your infection. Begin the Macrobid until your culture results come back in a few days. Follow up with Urology for further investigation into your recurrent UTI.

## 2022-01-28 DIAGNOSIS — M545 Low back pain, unspecified: Secondary | ICD-10-CM | POA: Diagnosis not present

## 2022-01-28 LAB — URINE CULTURE: Culture: >=100000 — AB

## 2022-01-30 ENCOUNTER — Telehealth (HOSPITAL_BASED_OUTPATIENT_CLINIC_OR_DEPARTMENT_OTHER): Payer: Self-pay | Admitting: *Deleted

## 2022-01-30 NOTE — Telephone Encounter (Signed)
Post ED Visit - Positive Culture Follow-up  Culture report reviewed by antimicrobial stewardship pharmacist: [facility] Team '[]'$  Elenor Quinones, Pharm.D. '[]'$  Heide Guile, Pharm.D., BCPS AQ-ID '[]'$  Parks Neptune, Pharm.D., BCPS '[]'$  Alycia Rossetti, Pharm.D., BCPS '[]'$  [place], [place].D., BCPS, AAHIVP '[]'$  Legrand Como, Pharm.D., BCPS, AAHIVP '[]'$  Salome Arnt, PharmD, BCPS '[]'$  Johnnette Gourd, PharmD, BCPS '[]'$  Hughes Better, PharmD, BCPS '[]'$  Leeroy Cha, PharmD '[]'$  Laqueta Linden, PharmD, BCPS '[]'$  Albertina Parr, PharmD  [place] Team '[]'$  Leodis Sias, PharmD '[]'$  Lindell Spar, PharmD '[]'$  Royetta Asal, PharmD '[]'$  Graylin Shiver, Rph '[]'$  Rema Fendt) Glennon Mac, PharmD '[]'$  Arlyn Dunning, PharmD '[]'$  Netta Cedars, PharmD '[]'$  Dia Sitter, PharmD '[]'$  Leone Haven, PharmD '[]'$  Gretta Arab, PharmD '[]'$  Theodis Shove, PharmD '[]'$  Peggyann Juba, PharmD '[]'$  Reuel Boom, PharmD   Positive urine culture Treated with Nitrofurantoin Monohyd Macro, organism sensitive to the same and no further patient follow-up is required at this time.  Wilson Singer Pharm D  Harlon Flor Talley 01/30/2022, 5:54 AM

## 2022-02-09 ENCOUNTER — Other Ambulatory Visit: Payer: Self-pay | Admitting: Endocrinology

## 2022-02-25 DIAGNOSIS — M47816 Spondylosis without myelopathy or radiculopathy, lumbar region: Secondary | ICD-10-CM | POA: Diagnosis not present

## 2022-02-26 ENCOUNTER — Encounter: Payer: Self-pay | Admitting: Psychiatry

## 2022-02-26 ENCOUNTER — Ambulatory Visit (INDEPENDENT_AMBULATORY_CARE_PROVIDER_SITE_OTHER): Payer: Medicare Other | Admitting: Psychiatry

## 2022-02-26 DIAGNOSIS — F319 Bipolar disorder, unspecified: Secondary | ICD-10-CM

## 2022-02-26 DIAGNOSIS — F5101 Primary insomnia: Secondary | ICD-10-CM

## 2022-02-26 DIAGNOSIS — G2581 Restless legs syndrome: Secondary | ICD-10-CM | POA: Diagnosis not present

## 2022-02-26 MED ORDER — DIVALPROEX SODIUM 250 MG PO DR TAB: DELAYED_RELEASE_TABLET | ORAL | 0 refills | Status: DC

## 2022-02-26 MED ORDER — MEMANTINE HCL 10 MG PO TABS: ORAL_TABLET | ORAL | 0 refills | Status: DC

## 2022-02-26 MED ORDER — ROPINIROLE HCL 0.5 MG PO TABS: ORAL_TABLET | ORAL | 0 refills | Status: DC

## 2022-02-26 MED ORDER — LITHIUM CARBONATE ER 450 MG PO TBCR: 450.0000 mg | EXTENDED_RELEASE_TABLET | Freq: Every day | ORAL | 0 refills | Status: DC

## 2022-02-26 MED ORDER — QUETIAPINE FUMARATE 200 MG PO TABS: ORAL_TABLET | ORAL | 0 refills | Status: DC
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
## 2022-02-27 ENCOUNTER — Telehealth: Payer: Self-pay

## 2022-02-27 NOTE — Telephone Encounter (Signed)
Edwards requested chart notes be sent to [phone]. Chart notes sent

## 2022-02-28 DIAGNOSIS — H01009 Unspecified blepharitis unspecified eye, unspecified eyelid: Secondary | ICD-10-CM | POA: Diagnosis not present

## 2022-03-07 DIAGNOSIS — R52 Pain, unspecified: Secondary | ICD-10-CM | POA: Diagnosis not present

## 2022-03-07 DIAGNOSIS — J029 Acute pharyngitis, unspecified: Secondary | ICD-10-CM | POA: Diagnosis not present

## 2022-03-07 DIAGNOSIS — Z03818 Encounter for observation for suspected exposure to other biological agents ruled out: Secondary | ICD-10-CM | POA: Diagnosis not present

## 2022-03-07 DIAGNOSIS — R059 Cough, unspecified: Secondary | ICD-10-CM | POA: Diagnosis not present

## 2022-03-09 DIAGNOSIS — J40 Bronchitis, not specified as acute or chronic: Secondary | ICD-10-CM | POA: Diagnosis not present

## 2022-03-09 DIAGNOSIS — R059 Cough, unspecified: Secondary | ICD-10-CM | POA: Diagnosis not present

## 2022-03-10 ENCOUNTER — Other Ambulatory Visit: Payer: Self-pay

## 2022-03-10 ENCOUNTER — Encounter (HOSPITAL_BASED_OUTPATIENT_CLINIC_OR_DEPARTMENT_OTHER): Payer: Self-pay | Admitting: *Deleted

## 2022-03-10 ENCOUNTER — Emergency Department (HOSPITAL_BASED_OUTPATIENT_CLINIC_OR_DEPARTMENT_OTHER)
Admission: EM | Admit: 2022-03-10 | Discharge: 2022-03-10 | Disposition: A | Discharge status: Home / Self Care | Payer: Medicare Other | Attending: Emergency Medicine | Admitting: Emergency Medicine

## 2022-03-10 ENCOUNTER — Emergency Department (HOSPITAL_BASED_OUTPATIENT_CLINIC_OR_DEPARTMENT_OTHER): Payer: Medicare Other | Admitting: Radiology

## 2022-03-10 DIAGNOSIS — Z7984 Long term (current) use of oral hypoglycemic drugs: Secondary | ICD-10-CM | POA: Insufficient documentation

## 2022-03-10 DIAGNOSIS — R9431 Abnormal electrocardiogram [ECG] [EKG]: Secondary | ICD-10-CM | POA: Diagnosis not present

## 2022-03-10 DIAGNOSIS — G4489 Other headache syndrome: Secondary | ICD-10-CM | POA: Diagnosis not present

## 2022-03-10 DIAGNOSIS — I10 Essential (primary) hypertension: Secondary | ICD-10-CM | POA: Diagnosis not present

## 2022-03-10 DIAGNOSIS — J069 Acute upper respiratory infection, unspecified: Secondary | ICD-10-CM | POA: Insufficient documentation

## 2022-03-10 DIAGNOSIS — R531 Weakness: Secondary | ICD-10-CM | POA: Diagnosis not present

## 2022-03-10 DIAGNOSIS — R110 Nausea: Secondary | ICD-10-CM | POA: Diagnosis not present

## 2022-03-10 DIAGNOSIS — E119 Type 2 diabetes mellitus without complications: Secondary | ICD-10-CM | POA: Diagnosis not present

## 2022-03-10 DIAGNOSIS — Z20822 Contact with and (suspected) exposure to covid-19: Secondary | ICD-10-CM | POA: Insufficient documentation

## 2022-03-10 DIAGNOSIS — R059 Cough, unspecified: Secondary | ICD-10-CM | POA: Diagnosis not present

## 2022-03-10 DIAGNOSIS — B9789 Other viral agents as the cause of diseases classified elsewhere: Secondary | ICD-10-CM | POA: Diagnosis not present

## 2022-03-10 DIAGNOSIS — Z79899 Other long term (current) drug therapy: Secondary | ICD-10-CM | POA: Insufficient documentation

## 2022-03-10 DIAGNOSIS — Z794 Long term (current) use of insulin: Secondary | ICD-10-CM | POA: Insufficient documentation

## 2022-03-10 DIAGNOSIS — R52 Pain, unspecified: Secondary | ICD-10-CM | POA: Diagnosis not present

## 2022-03-10 LAB — CBC WITH DIFFERENTIAL/PLATELET
Abs Immature Granulocytes: 0.24 10*3/uL — ABNORMAL HIGH (ref 0.00–0.07)
Basophils Absolute: 0.1 10*3/uL (ref 0.0–0.1)
Basophils Relative: 0 %
Eosinophils Absolute: 0.5 10*3/uL (ref 0.0–0.5)
Eosinophils Relative: 5 %
HCT: 40.6 % (ref 36.0–46.0)
Hemoglobin: 12.8 g/dL (ref 12.0–15.0)
Immature Granulocytes: 2 %
Lymphocytes Relative: 15 %
Lymphs Abs: 1.7 10*3/uL (ref 0.7–4.0)
MCH: 29.7 pg (ref 26.0–34.0)
MCHC: 31.5 g/dL (ref 30.0–36.0)
MCV: 94.2 fL (ref 80.0–100.0)
Monocytes Absolute: 0.6 10*3/uL (ref 0.1–1.0)
Monocytes Relative: 5 %
Neutro Abs: 8.3 10*3/uL — ABNORMAL HIGH (ref 1.7–7.7)
Neutrophils Relative %: 73 %
Platelets: 205 10*3/uL (ref 150–400)
RBC: 4.31 MIL/uL (ref 3.87–5.11)
RDW: 13.5 % (ref 11.5–15.5)
WBC: 11.5 10*3/uL — ABNORMAL HIGH (ref 4.0–10.5)
nRBC: 0 % (ref 0.0–0.2)

## 2022-03-10 LAB — BASIC METABOLIC PANEL
Anion gap: 9 (ref 5–15)
BUN: 12 mg/dL (ref 8–23)
CO2: 26 mmol/L (ref 22–32)
Calcium: 10.4 mg/dL — ABNORMAL HIGH (ref 8.9–10.3)
Chloride: 107 mmol/L (ref 98–111)
Creatinine, Ser: 0.7 mg/dL (ref 0.44–1.00)
GFR, Estimated: >60 mL/min (ref >60)
Glucose, Bld: 131 mg/dL — ABNORMAL HIGH (ref 70–99)
Potassium: 4.7 mmol/L (ref 3.5–5.1)
Sodium: 142 mmol/L (ref 135–145)

## 2022-03-10 LAB — RESP PANEL BY RT-PCR (RSV, FLU A&B, COVID)  RVPGX2
Influenza A by PCR: NEGATIVE
Influenza B by PCR: NEGATIVE
Resp Syncytial Virus by PCR: NEGATIVE
SARS Coronavirus 2 by RT PCR: NEGATIVE

## 2022-03-10 MED ORDER — ALBUTEROL SULFATE (2.5 MG/3ML) 0.083% IN NEBU
5.0000 mg | INHALATION_SOLUTION | Freq: Once | RESPIRATORY_TRACT | Status: AC
  Administered 2022-03-10: 5 mg via RESPIRATORY_TRACT
  Filled 2022-03-10: qty 6

## 2022-03-10 MED ORDER — LACTATED RINGERS IV BOLUS
1000.0000 mL | Freq: Once | INTRAVENOUS | Status: AC
  Administered 2022-03-10: 1000 mL via INTRAVENOUS

## 2022-03-10 MED ORDER — DEXAMETHASONE SODIUM PHOSPHATE 10 MG/ML IJ SOLN
10.0000 mg | Freq: Once | INTRAMUSCULAR | Status: AC
  Administered 2022-03-10: 10 mg via INTRAVENOUS
  Filled 2022-03-10: qty 1

## 2022-03-10 MED ORDER — ALBUTEROL SULFATE HFA 108 (90 BASE) MCG/ACT IN AERS
2.0000 | INHALATION_SPRAY | RESPIRATORY_TRACT | Status: DC | PRN
  Administered 2022-03-10: 2 via RESPIRATORY_TRACT
  Filled 2022-03-10: qty 6.7

## 2022-03-10 MED ORDER — ONDANSETRON HCL 4 MG/2ML IJ SOLN
4.0000 mg | Freq: Once | INTRAMUSCULAR | Status: AC
  Administered 2022-03-10: 4 mg via INTRAVENOUS
  Filled 2022-03-10: qty 2

## 2022-03-10 NOTE — ED Triage Notes (Signed)
Pt arrives to ED via [facility] with c/o weakness, cough, headache x1 week.

## 2022-03-10 NOTE — ED Triage Notes (Signed)
Patient with one week hx of feeling bad with weakness, body aches, headache, cough.  She was seen at [facility] on yesterday.  Negative flu test.  Patient was given prescription for doxy.  She was advised to come to the ED if her sx did not improve.  Patient reports she has taken delsym and mucinex at home.  She reports she has been nauseated as well

## 2022-03-10 NOTE — ED Provider Notes (Addendum)
[facility] Provider Note   CSN: [id number] Arrival date & time: 03/10/22  4098     History  Chief Complaint  Patient presents with   Weakness   Nausea   Cough   Headache    [name] [name] is a 77 y.o. female.  Patient is a 77 year old female with a history of bipolar disease, diabetes, hypertension, lupus, prior ischemic colitis who is presenting today with a 1 week history of persistent upper respiratory symptoms.  She reports this whole week she has had cough, congestion, headache.  She is having sputum production with her cough and significant nasal drainage.  She has not had a fever that she is aware of.  However she has been feeling short of breath.  She has had poor appetite and at times feels nauseated.  She has not had any emesis or diarrhea.  She does not use inhalers at home and was recently seen at urgent care yesterday and given doxycycline which she has taken 2 doses of but reports she does not feel any better.  Denies a history of tobacco use or regular need for an inhaler.  The history is provided by the patient.  Weakness Associated symptoms: cough and headaches   Cough Associated symptoms: headaches   Headache Associated symptoms: cough and weakness        Home Medications Prior to Admission medications   Medication Sig Start Date End Date Taking? Authorizing Provider  acetaminophen (TYLENOL) 325 MG tablet Take 2 tablets (650 mg total) by mouth every 6 (six) hours as needed. Patient taking differently: Take 650 mg by mouth every 6 (six) hours as needed for mild pain. 02/11/21   Jeanell Sparrow, DO  ALPRAZolam Duanne Moron) 0.5 MG tablet Take 1 tablet (0.5 mg total) by mouth 4 (four) times daily as needed for anxiety. 02/18/22   [name], PMHNP  atorvastatin (LIPITOR) 40 MG tablet Take 40 mg by mouth every evening.    [provider]  b complex vitamins capsule Take 1 capsule by mouth daily.    [provider]   BD PEN NEEDLE NANO 2ND GEN 32G X 4 MM MISC USE TO INJECT MEDICATION FIVE TIMES DAILY AS DIRECTED 02/10/22   Elayne Snare, MD  bismuth subsalicylate (PEPTO BISMOL) 262 MG/15ML suspension Take 30 mLs by mouth every 6 (six) hours as needed.    [provider]  cholecalciferol (VITAMIN D3) 25 MCG (1000 UNIT) tablet Take 1,000 Units by mouth daily. Also takes 2,000 units capsule at bedtime    [provider]  CONTOUR NEXT TEST test strip USE TO TEST BLOOD SUGAR TWICE DAILY 08/01/21   Elayne Snare, MD  cyclobenzaprine (FLEXERIL) 5 MG tablet Take 5 mg by mouth 3 (three) times daily as needed for muscle spasms.    [provider]  dicyclomine (BENTYL) 20 MG tablet Take 1 tablet (20 mg total) by mouth 3 (three) times daily as needed for spasms. 06/17/21   Shelly Coss, MD  divalproex (DEPAKOTE) 250 MG DR tablet Take 1 tablet (250 mg total) by mouth every morning AND 3 tablets (750 mg total) at bedtime. 02/26/22 05/27/22  [name], PMHNP  donepezil (ARICEPT) 10 MG tablet Take 1 tablet (10 mg total) by mouth at bedtime. 01/21/22   [name], PMHNP  exenatide (BYETTA 5 MCG PEN) 5 MCG/0.02ML SOPN injection Inject 5 mcg into the skin 2 (two) times daily with a meal. Patient not taking: Reported on 01/21/2022 11/26/21   Elayne Snare, MD  ezetimibe (ZETIA) 10 MG tablet Take 1 tablet (10 mg total) by mouth daily. 10/07/21   Elayne Snare, MD  fluticasone Asencion Islam) 50 MCG/ACT nasal spray Place into both nostrils as needed for allergies or rhinitis.    [provider]  gabapentin (NEURONTIN) 600 MG tablet Take 600 mg by mouth 3 (three) times daily. 05/12/19   [provider]  ibuprofen (ADVIL) 800 MG tablet Take 800 mg by mouth every 8 (eight) hours as needed for moderate pain (following dental implants).    [provider]  insulin lispro (HUMALOG KWIKPEN) 100 UNIT/ML KwikPen ADMINISTER 10 UNITS UNDER THE SKIN BEFORE MEALS Patient taking differently: Inject 10  Units into the skin 3 (three) times daily as needed (high blood sugar). 10 units before breakfast and lunch and 12 units before dinner 05/30/21   Elayne Snare, MD  LANTUS SOLOSTAR 100 UNIT/ML Solostar Pen ADMINISTER 66 UNITS UNDER THE SKIN AT BEDTIME 01/26/22   Elayne Snare, MD  levothyroxine (SYNTHROID) 75 MCG tablet TAKE 1 TABLET BY MOUTH EVERY DAY BEFORE BREAKFAST Patient taking differently: Take 75 mcg by mouth daily before breakfast. 03/27/21   Elayne Snare, MD  liothyronine (CYTOMEL) 5 MCG tablet TAKE 1 TABLET BY MOUTH DAILY WITH LEVOTHYROXINE. TAKE BEFORE BREAKFAST Patient taking differently: Take 5 mcg by mouth daily. 03/27/21   Elayne Snare, MD  lithium carbonate (ESKALITH) 450 MG ER tablet Take 1 tablet (450 mg total) by mouth at bedtime. 02/26/22   [name], PMHNP  loperamide (IMODIUM) 2 MG capsule Take 2 capsules (4 mg total) by mouth 3 (three) times daily as needed for diarrhea or loose stools. 11/12/21   Danford, Suann Larry, MD  loratadine (CLARITIN) 10 MG tablet Take 10 mg by mouth daily as needed for allergies.    [provider]  losartan (COZAAR) 25 MG tablet Take 25 mg by mouth every evening. 08/31/19   [provider]  memantine (NAMENDA) 10 MG tablet TAKE 1 TABLET(10 MG) BY MOUTH EVERY EVENING 02/26/22   [name], PMHNP  methocarbamol (ROBAXIN) 500 MG tablet Take 1 tablet (500 mg total) by mouth every 8 (eight) hours as needed for muscle spasms. Patient not taking: Reported on 01/21/2022 11/26/21   Ezequiel Essex, MD  Microlet Lancets MISC TEST DAILY AS DIRECTED 08/01/21   Elayne Snare, MD  nitrofurantoin, macrocrystal-monohydrate, (MACROBID) 100 MG capsule Take 1 capsule (100 mg total) by mouth 2 (two) times daily. 01/26/22   Truddie Hidden, MD  nitroGLYCERIN (NITROSTAT) 0.4 MG SL tablet Place 1 tablet (0.4 mg total) under the tongue every 5 (five) minutes as needed for chest pain. 09/14/17   Lyda Jester M, PA-C  ondansetron (ZOFRAN) 4 MG tablet  Take 1 tablet (4 mg total) by mouth every 6 (six) hours. 09/20/21   Curatolo, Adam, DO  oxyCODONE (ROXICODONE) 5 MG immediate release tablet Take 1 tablet (5 mg total) by mouth every 6 (six) hours as needed for up to 10 doses for breakthrough pain. Patient not taking: Reported on 01/21/2022 07/04/21   Lennice Sites, DO  oxyCODONE-acetaminophen (PERCOCET/ROXICET) 5-325 MG tablet Take 1 tablet by mouth every 6 (six) hours as needed for severe pain. Patient not taking: Reported on 01/21/2022 06/17/21   Shelly Coss, MD  PEG 3350-KCl-NaBcb-NaCl-NaSulf (PEG-3350/ELECTROLYTES) 236 g SOLR Take 17 g by mouth daily as needed. 10/06/21   [provider]  phenazopyridine (PYRIDIUM) 200 MG tablet Take 1 tablet (200 mg total) by mouth 3 (three) times daily. Patient not taking: Reported on 01/21/2022 12/12/21  Wynona Dove A, DO  QUEtiapine (SEROQUEL) 200 MG tablet TAKE 1 TAB PO q 5 pm and 1 tab po QHS 02/26/22   [name], PMHNP  rOPINIRole (REQUIP) 0.5 MG tablet Takes 4 tabs at 5 pm and 3 tabs at bedtime and 1-2 tabs prn 02/26/22   [name], PMHNP  vancomycin (VANCOCIN) 125 MG capsule Take 1 capsule (125 mg total) by mouth 2 (two) times daily. Patient not taking: Reported on 01/21/2022 11/12/21   Edwin Dada, MD  Wheat Dextrin (BENEFIBER PO) Take 1 Dose by mouth daily. Patient not taking: Reported on 01/21/2022    [provider]      Allergies    Codeine, Procaine hcl, Epinephrine, Aspirin, Augmentin [amoxicillin-pot clavulanate], Benadryl [diphenhydramine], Diflucan [fluconazole], Dilaudid [hydromorphone hcl], Hydromorphone, Morphine, Other, Prednisone, Tramadol hcl, and Sulfa antibiotics    Review of Systems   Review of Systems  Respiratory:  Positive for cough.   Neurological:  Positive for weakness and headaches.    Physical Exam Updated Vital Signs BP 136/63   Pulse 85   Temp 98.2 F (36.8 C) (Oral)   Resp 15   SpO2 95%  Physical Exam Vitals and  nursing note reviewed.  Constitutional:      General: She is not in acute distress.    Appearance: She is well-developed.     Comments: Patient is able to speak in full sentences  HENT:     Head: Normocephalic and atraumatic.     Mouth/Throat:     Mouth: Mucous membranes are dry.  Eyes:     Pupils: Pupils are equal, round, and reactive to light.  Cardiovascular:     Rate and Rhythm: Normal rate and regular rhythm.     Heart sounds: Normal heart sounds. No murmur heard.    No friction rub.  Pulmonary:     Effort: Pulmonary effort is normal.     Breath sounds: Normal breath sounds. No wheezing or rales.     Comments: Scant wheezing Abdominal:     General: Bowel sounds are normal. There is no distension.     Palpations: Abdomen is soft.     Tenderness: There is no abdominal tenderness. There is no guarding or rebound.  Musculoskeletal:        General: No tenderness. Normal range of motion.     Right lower leg: No edema.     Left lower leg: No edema.     Comments: No edema  Skin:    General: Skin is warm and dry.     Findings: No rash.  Neurological:     Mental Status: She is alert and oriented to person, place, and time. Mental status is at baseline.     Cranial Nerves: No cranial nerve deficit.  Psychiatric:        Behavior: Behavior normal.     ED Results / Procedures / Treatments   Labs (all labs ordered are listed, but only abnormal results are displayed) Labs Reviewed  CBC WITH DIFFERENTIAL/PLATELET - Abnormal; Notable for the following components:      Result Value   WBC 11.5 (*)    Neutro Abs 8.3 (*)    Abs Immature Granulocytes 0.24 (*)    All other components within normal limits  BASIC METABOLIC PANEL - Abnormal; Notable for the following components:   Glucose, Bld 131 (*)    Calcium 10.4 (*)    All other components within normal limits  RESP PANEL BY RT-PCR (RSV, FLU A&B, COVID)  [id number]    EKG EKG Interpretation  Date/Time:  Tuesday March 10 2022  13:34:02 EST Ventricular Rate:  83 PR Interval:  173 QRS Duration: 83 QT Interval:  356 QTC Calculation: 419 R Axis:   61 Text Interpretation: Sinus rhythm Low voltage, precordial leads No significant change since last tracing Confirmed by Blanchie Dessert [phone]) on 03/10/2022 1:46:50 PM  Radiology DG Chest 2 View  Result Date: 03/10/2022 CLINICAL DATA:  Cough EXAM: CHEST - 2 VIEW COMPARISON:  07/06/2021 FINDINGS: The heart size and mediastinal contours are within normal limits. Both lungs are clear. The visualized skeletal structures are unremarkable. IMPRESSION: No acute abnormality of the lungs. Electronically Signed   By: Delanna Ahmadi M.D.   On: 03/10/2022 10:43    Procedures Procedures    Medications Ordered in ED Medications  dexamethasone (DECADRON) injection 10 mg (has no administration in time range)  albuterol (VENTOLIN HFA) 108 (90 Base) MCG/ACT inhaler 2 puff (has no administration in time range)  lactated ringers bolus 1,000 mL (0 mLs Intravenous Stopped 03/10/22 1454)  albuterol (PROVENTIL) (2.5 MG/3ML) 0.083% nebulizer solution 5 mg (5 mg Nebulization Given 03/10/22 1332)  ondansetron (ZOFRAN) injection 4 mg (4 mg Intravenous Given 03/10/22 1351)    ED Course/ Medical Decision Making/ A&P                           Medical Decision Making Amount and/or Complexity of Data Reviewed Labs: ordered. Decision-making details documented in ED Course. Radiology: ordered and independent interpretation performed. Decision-making details documented in ED Course. ECG/medicine tests: ordered and independent interpretation performed. Decision-making details documented in ED Course.  Risk Prescription drug management.   Pt with multiple medical problems and comorbidities and presenting today with a complaint that caries a high risk for morbidity and mortality.  Here today with symptoms of shortness of breath, weakness and URI symptoms.  Concern for flu, COVID, RSV, other viral  illness, pneumonia, anemia, electrolyte abnormalities, bronchitis.  Patient has scant wheezing on exam and sats are greater than 90% on room air.  She has no abdominal pain concerning for acute abdominal pathology.  I independently interpreted patient's EKG and labs.  EKG without acute findings today, viral panel is negative I have independently visualized and interpreted pt's images today.  Chest x-ray within normal limits.  3:41 PM Patient feeling better after albuterol and IV fluids.  When she falls asleep she does desat to the mid 80s but she reports that that has been an issue for quite some time.  Most likely related to positioning as patient's head and posture are bent over.  Patient has no unilateral leg pain or swelling with lower suspicion for PE at this time.  Vital signs are otherwise normal.  Will ambulate patient to ensure no signs of hypoxia.  She was given a dose of steroids here for postviral bronchitis.  She was also given an inhaler to use at home.  She will continue to use the doxycycline.  Pt maintaining sats >90% with ambulating.          Final Clinical Impression(s) / ED Diagnoses Final diagnoses:  Viral upper respiratory tract infection    Rx / DC Orders ED Discharge Orders     None         Blanchie Dessert, MD 03/10/22 1549    Blanchie Dessert, MD 03/10/22 [id number]

## 2022-03-10 NOTE — ED Notes (Signed)
Pt's sat noted to drop into mid-high 80s w/ sleep.  Respiratory at bedside.

## 2022-03-10 NOTE — ED Notes (Addendum)
Reviewed AVS/discharge instruction with patient. Time allotted for and all questions answered. Patient is agreeable for d/c and escorted to ed exit by staff. Husband to pick up from lobby

## 2022-03-10 NOTE — ED Notes (Addendum)
Pt maintained SpO2 of 91-94% while ambulating to the bathroom. RN notified.

## 2022-03-10 NOTE — Discharge Instructions (Signed)
Your blood work and your x-ray look okay today.  You are given a dose of steroids to hopefully help with the bronchitis that you are having.  You can use the inhaler every 4-6 hours as needed if you are wheezing or feeling short of breath.  Make sure you continue to eat and drink and get plenty of rest.  If you start feeling like you cannot catch her breath, vomiting or other concerns return to the emergency room.  Try honey for your cough.

## 2022-03-13 ENCOUNTER — Other Ambulatory Visit: Payer: Self-pay

## 2022-03-13 DIAGNOSIS — E1165 Type 2 diabetes mellitus with hyperglycemia: Secondary | ICD-10-CM

## 2022-03-13 MED ORDER — INSULIN LISPRO (1 UNIT DIAL) 100 UNIT/ML (KWIKPEN): PEN_INJECTOR | SUBCUTANEOUS | 3 refills | Status: DC

## 2022-03-14 ENCOUNTER — Other Ambulatory Visit: Payer: Self-pay | Admitting: Endocrinology

## 2022-03-16 ENCOUNTER — Encounter: Payer: Self-pay | Admitting: Psychiatry

## 2022-03-16 ENCOUNTER — Ambulatory Visit (INDEPENDENT_AMBULATORY_CARE_PROVIDER_SITE_OTHER): Payer: Medicare Other | Admitting: Psychiatry

## 2022-03-16 DIAGNOSIS — F319 Bipolar disorder, unspecified: Secondary | ICD-10-CM | POA: Diagnosis not present

## 2022-03-16 DIAGNOSIS — F411 Generalized anxiety disorder: Secondary | ICD-10-CM | POA: Diagnosis not present

## 2022-03-16 DIAGNOSIS — F5101 Primary insomnia: Secondary | ICD-10-CM | POA: Diagnosis not present

## 2022-03-16 MED ORDER — DIVALPROEX SODIUM 250 MG PO DR TAB: DELAYED_RELEASE_TABLET | ORAL | 1 refills | Status: DC

## 2022-03-16 NOTE — Progress Notes (Unsigned)
Virtual Visit via Telephone Note  I connected with [name] [name] on 03/16/22 at 10:00 AM EST by telephone and verified that I am speaking with the correct person using two identifiers.  Location: Patient: Home   Provider: [facility]   I discussed the limitations, risks, security and privacy concerns of performing an evaluation and management service by telephone and the availability of in person appointments. I also discussed with the patient that there may be a patient responsible charge related to this service. The patient expressed understanding and agreed to proceed.  Follow Up Instructions:    I discussed the assessment and treatment plan with the patient. The patient was provided an opportunity to ask questions and all were answered. The patient agreed with the plan and demonstrated an understanding of the instructions.   The patient was advised to call back or seek an in-person evaluation if the symptoms worsen or if the condition fails to improve as anticipated.  I provided 23 minutes of non-face-to-face time during this encounter.   [name], PMHNP   [name] [name] [id number] September 23, 1945 77 y.o.  Subjective:   Patient ID:  [name] [name] is a 77 y.o. (DOB 20-Jul-1945) female.  Chief Complaint:  Chief Complaint  Patient presents with   Insomnia   Depression   Anxiety    HPI JADELIN [name] presents to the office today for follow-up of Bipolar Disorder, anxiety, and insomnia.   She reports that she has had a severe respiratory infection and had to be seen in the ER and received antibiotics, IV steroids, nebulizers, etc. She reports that she has not been out of the house for over a week. She reports that she is feeling some better. She had some out of town guests during that time.   She has been "feeling depressed and anxious." She reports that she has not been sleeping well. She reports sleeping about 4 hours total. She has been taking OTC  cold/flu meds. Her energy and motivation have been very low- "I don't want to do anything." She reports that she has increased appetite and has been unable to lose weight. "I keep gaining weight." She reports that she contacted her PCP's office this morning about Mounjaro or another weight loss medication.   She reports increase in Depakote has helped with manic s/s. She reports that Depakote does not call her to feel as tired as it once did. She reports that she currently is having some restlessness- "I have the desire but I don't have the energy." She reports that she has not been having impulsivity or risky behavior since she has had respiratory infection. She reports some irritability. She reports poor concentration. Minimal enjoyment in things. Denies SI.   She reports that she tolerates steroids better via IV instead of oral tabs.   This is her husband's busy time at work and he will be working 6 days a week.   Continues to see Beckey Downing, LCAS regularly.   Past Medication Trials: Tegretol-adverse reaction Lamictal-itching, swollen lymph nodes Trileptal-ineffective Depakote- has taken long-term with some benefit Gabapentin-prescribed for RLS, pain, and anxiety.  Unable to tolerate doses above 400 mg 3 times daily Topamax Gabitril Lyrica Keppra Zonegran Sonata-intermittently effective Xanax-effective Ativan Valium Klonopin-patient reports feeling "peculiar" Lithium-has taken long-term with some improvement.  Unable to tolerate doses greater than 450 mg Seroquel- helpful for racing thoughts and mood signs and symptoms.  Daytime somnolence with higher doses.  Has caused RLS without Requip. Geodon-tolerability issues Rexulti-EPS  Latuda-EPS Vraylar-EPS Saphris-excessive sedation and severe RLS Abilify-may have exacerbated gambling Risperdal Olanzapine- RLS, increased appetite, wt gain Perphenazine Loxapine- severe adverse effects at low dose. Had weakness,  twitches BuSpar-ineffective Prozac-effective and then stopped working Zoloft-could not tolerate Lexapro-has used short-term to alleviate depressive signs and symptoms Remeron Wellbutrin Vistaril- ineffective Requip- takes due to restless legs secondary to Seroquel.  Has taken long-term and reports being on higher doses in the past.  Denies correlation between Requip and gambling. Pramipexole NAC Deplin Buprenorphine Namenda Verapamil Pindolol Isradapine Clonidine Lunesta-ineffective Belsomra- ineffective Dayvigo- Legs "buckled up." Ambien Ambien CR-in effective Trazodone-nightmares Doxepin- ineffective Remeron  AIMS    Flowsheet Row Office Visit from 10/24/2020 in [facility] Office Visit from 06/17/2020 in [facility] Visit from 04/23/2020 in [facility] Visit from 10/09/2019 in [facility] Total Score 0 0 0 [place] Visit from 04/23/2020 in [facility] ED from 03/28/2020 in [facility]  Total GAD-7 Score 11 [place] Office Visit from 08/07/2021 in [facility] Office Visit from 01/27/2021 in [facility]  Total Score (max 30 points ) 28 28      PHQ2-9    [place] Telephone from 11/21/2021 in [facility] from [facility]16/[facility] from 03/28/2020 in [facility] Nutrition from 04/30/2016 in [facility]  PHQ-2 Total Score 0 0 6 6  PHQ-9 Total Score -- -- 26 [place] ED from 03/10/2022 in [facility] ED from 01/26/2022 in [facility] ED from 12/28/2021 in [facility]  C-SSRS RISK CATEGORY No Risk No  Risk No Risk        Review of Systems:  Review of Systems  HENT:  Positive for congestion.   Respiratory:  Positive for shortness of breath.   Musculoskeletal:  Negative for gait problem.  Neurological:  Positive for tremors.       She reports that she is waking up in the middle of the night "shaking and will take Xanax" upon awakening with good response. Reports feeling anxious when this occurs. Reports shaking started to occur prior to increase of Depakote.   Psychiatric/Behavioral:         Please refer to HPI    Medications: I have reviewed the patient's current medications.  Current Outpatient Medications  Medication Sig Dispense Refill   benzonatate (TESSALON) 100 MG capsule Take by mouth.     diphenhydrAMINE-PE-APAP (THERAFLU SEVERE COLD NIGHTTIME PO) Take by mouth.     doxycycline (VIBRA-TABS) 100 MG tablet Take 100 mg by mouth 2 (two) times daily.     acetaminophen (TYLENOL) 325 MG tablet Take 2 tablets (650 mg total) by mouth every 6 (six) hours as needed. (Patient taking differently: Take 650 mg by mouth every 6 (six) hours as needed for mild pain.) 36 tablet 0   ALPRAZolam (XANAX) 0.5 MG tablet Take 1 tablet (0.5 mg total) by mouth 4 (four) times daily as needed for anxiety. 120 tablet 2   atorvastatin (LIPITOR) 40 MG tablet Take 40 mg by mouth every evening.  0   b complex vitamins capsule Take 1 capsule by mouth daily.     BD PEN NEEDLE NANO 2ND GEN 32G  X 4 MM MISC USE TO INJECT 5 TIMES DAILY AS DIRECTED 300 each 0   bismuth subsalicylate (PEPTO BISMOL) 262 MG/15ML suspension Take 30 mLs by mouth every 6 (six) hours as needed.     cholecalciferol (VITAMIN D3) 25 MCG (1000 UNIT) tablet Take 1,000 Units by mouth daily. Also takes 2,000 units capsule at bedtime     CONTOUR NEXT TEST test strip USE TO TEST BLOOD SUGAR TWICE DAILY 100 strip 3   cyclobenzaprine (FLEXERIL) 5 MG tablet Take 5 mg by mouth 3 (three) times daily as needed for muscle spasms.     dicyclomine (BENTYL)  20 MG tablet Take 1 tablet (20 mg total) by mouth 3 (three) times daily as needed for spasms. 20 tablet 0   divalproex (DEPAKOTE) 250 MG DR tablet Take 1 tablet (250 mg total) by mouth 2 (two) times daily AND 3 tablets (750 mg total) at bedtime. 150 tablet 1   donepezil (ARICEPT) 10 MG tablet Take 1 tablet (10 mg total) by mouth at bedtime. 90 tablet 0   exenatide (BYETTA 5 MCG PEN) 5 MCG/0.02ML SOPN injection Inject 5 mcg into the skin 2 (two) times daily with a meal. (Patient not taking: Reported on 01/21/2022) 1.2 mL 1   ezetimibe (ZETIA) 10 MG tablet Take 1 tablet (10 mg total) by mouth daily. 90 tablet 3   fluticasone (FLONASE) 50 MCG/ACT nasal spray Place into both nostrils as needed for allergies or rhinitis.     gabapentin (NEURONTIN) 600 MG tablet Take 600 mg by mouth 3 (three) times daily.     ibuprofen (ADVIL) 800 MG tablet Take 800 mg by mouth every 8 (eight) hours as needed for moderate pain (following dental implants).     insulin lispro (HUMALOG KWIKPEN) 100 UNIT/ML KwikPen ADMINISTER 12 UNITS UNDER THE SKIN BEFORE MEALS 15 mL 3   LANTUS SOLOSTAR 100 UNIT/ML Solostar Pen ADMINISTER 66 UNITS UNDER THE SKIN AT BEDTIME 15 mL 2   levothyroxine (SYNTHROID) 75 MCG tablet TAKE 1 TABLET BY MOUTH EVERY DAY BEFORE BREAKFAST (Patient taking differently: Take 75 mcg by mouth daily before breakfast.) 90 tablet 1   liothyronine (CYTOMEL) 5 MCG tablet TAKE 1 TABLET BY MOUTH DAILY WITH LEVOTHYROXINE. TAKE BEFORE BREAKFAST (Patient taking differently: Take 5 mcg by mouth daily.) 90 tablet 1   lithium carbonate (ESKALITH) 450 MG ER tablet Take 1 tablet (450 mg total) by mouth at bedtime. 90 tablet 0   loperamide (IMODIUM) 2 MG capsule Take 2 capsules (4 mg total) by mouth 3 (three) times daily as needed for diarrhea or loose stools. 30 capsule 0   loratadine (CLARITIN) 10 MG tablet Take 10 mg by mouth daily as needed for allergies.     losartan (COZAAR) 25 MG tablet Take 25 mg by mouth every evening.      memantine (NAMENDA) 10 MG tablet TAKE 1 TABLET(10 MG) BY MOUTH EVERY EVENING 90 tablet 0   methocarbamol (ROBAXIN) 500 MG tablet Take 1 tablet (500 mg total) by mouth every 8 (eight) hours as needed for muscle spasms. (Patient not taking: Reported on 01/21/2022) 20 tablet 0   Microlet Lancets MISC TEST DAILY AS DIRECTED 100 each 3   nitrofurantoin, macrocrystal-monohydrate, (MACROBID) 100 MG capsule Take 1 capsule (100 mg total) by mouth 2 (two) times daily. 10 capsule 0   nitroGLYCERIN (NITROSTAT) 0.4 MG SL tablet Place 1 tablet (0.4 mg total) under the tongue every 5 (five) minutes as needed for chest pain. 25 tablet 2   ondansetron (  ZOFRAN) 4 MG tablet Take 1 tablet (4 mg total) by mouth every 6 (six) hours. 12 tablet 0   oxyCODONE (ROXICODONE) 5 MG immediate release tablet Take 1 tablet (5 mg total) by mouth every 6 (six) hours as needed for up to 10 doses for breakthrough pain. (Patient not taking: Reported on 01/21/2022) 10 tablet 0   oxyCODONE-acetaminophen (PERCOCET/ROXICET) 5-325 MG tablet Take 1 tablet by mouth every 6 (six) hours as needed for severe pain. (Patient not taking: Reported on 01/21/2022) 15 tablet 0   PEG 3350-KCl-NaBcb-NaCl-NaSulf (PEG-3350/ELECTROLYTES) 236 g SOLR Take 17 g by mouth daily as needed.     phenazopyridine (PYRIDIUM) 200 MG tablet Take 1 tablet (200 mg total) by mouth 3 (three) times daily. (Patient not taking: Reported on 01/21/2022) 6 tablet 0   QUEtiapine (SEROQUEL) 200 MG tablet TAKE 1 TAB PO q 5 pm and 1 tab po QHS 180 tablet 0   rOPINIRole (REQUIP) 0.5 MG tablet Takes 4 tabs at 5 pm and 3 tabs at bedtime and 1-2 tabs prn 720 tablet 0   vancomycin (VANCOCIN) 125 MG capsule Take 1 capsule (125 mg total) by mouth 2 (two) times daily. (Patient not taking: Reported on 01/21/2022) 21 capsule 0   Wheat Dextrin (BENEFIBER PO) Take 1 Dose by mouth daily. (Patient not taking: Reported on 01/21/2022)     No current facility-administered medications for this visit.     Medication Side Effects: Other: Weight gain  Allergies:  Allergies  Allergen Reactions   Codeine Anxiety and Other (See Comments)    Hallucinations, tolerates oxycodone    Procaine Hcl Palpitations   Epinephrine     Other reaction(s): Other (See Comments) Makes heart race at the dentist Makes heart race at the dentist    Aspirin Nausea And Vomiting and Other (See Comments)    Reaction:  Burns pts stomach    Augmentin [Amoxicillin-Pot Clavulanate] Diarrhea   Benadryl [Diphenhydramine] Other (See Comments)    Per MD "inhibits potency of gabapentin, lithium etc"   Diflucan [Fluconazole] Other (See Comments)    Unknown reaction    Dilaudid [Hydromorphone Hcl] Other (See Comments)    Migraines and nightmares    Hydromorphone Other (See Comments)   Morphine Other (See Comments)    headache   Other Nausea And Vomiting and Other (See Comments)    Pt states that all -mycins cause N/V. (MACROLIDES)    Prednisone     Long term steroid problems with lithium and blood sugar.    Tramadol Hcl     Other reaction(s): felt weird   Sulfa Antibiotics Other (See Comments)    Unknown reaction    Past Medical History:  Diagnosis Date   Alcohol abuse    Anxiety    takes Valium daily as needed and Ativan daily   Bilateral hearing loss    Bipolar 1 disorder ([place])    takes Lithium nightly and Synthroid daily   Chronic back pain    DDD; "all over" (09/14/2017)   Colitis, ischemic ([place]) 2012   Confusion    r/t meds   Depression    takes Prozac daily and Bupspirone    Diverticulosis    Dyslipidemia    takes Crestor daily   Fibromyalgia    Gastroparesis    Headache    "weekly" (09/14/2017)   Hepatic steatosis 06/18/12   severe   Hyperlipidemia    Hypertension    Hypothyroidism    IBS (irritable bowel syndrome)    Ischemic colitis ([place])  Joint pain    Joint swelling    Lupus erythematosus tumidus    tumid-skin   Migraine    "1-2/yr; maybe" (09/14/2017)   Numbness    in right  foot   Osteoarthritis    "all over" (09/14/2017)   Osteoarthritis cervical spine    Osteoarthritis of hand    bilateral   Pneumonia    "walking pneumonia several times; long time since the last time" (09/14/2017)   Restless leg syndrome    takes Requip nightly   Sciatica    Type II diabetes mellitus ([name])    Urinary frequency    Urinary leakage    Urinary urgency    Urinary, incontinence, stress female    Walking pneumonia    last time more than 66yr ago    Past Medical History, Surgical history, Social history, and Family history were reviewed and updated as appropriate.   Please see review of systems for further details on the patient's review from today.   Objective:   Physical Exam:  There were no vitals taken for this visit.  Physical Exam Neurological:     Mental Status: She is alert and oriented to person, place, and time.     Cranial Nerves: No dysarthria.  Psychiatric:        Attention and Perception: Attention and perception normal.        Mood and Affect: Mood is anxious and depressed.        Speech: Speech normal.        Behavior: Behavior is cooperative.        Thought Content: Thought content normal. Thought content is not paranoid or delusional. Thought content does not include homicidal or suicidal ideation. Thought content does not include homicidal or suicidal plan.        Cognition and Memory: Cognition and memory normal.        Judgment: Judgment normal.     Comments: Insight intact     Lab Review:     Component Value Date/Time   NA 142 03/10/2022 1354   NA 140 01/27/2021 1526   K 4.7 03/10/2022 1354   CL 107 03/10/2022 1354   CO2 26 03/10/2022 1354   GLUCOSE 131 (H) 03/10/2022 1354   BUN 12 03/10/2022 1354   BUN 8 01/27/2021 1526   CREATININE 0.70 03/10/2022 1354   CREATININE 0.85 10/09/2019 1052   CALCIUM 10.4 (H) 03/10/2022 1354   PROT 7.0 01/26/2022 0013   PROT 6.4 01/27/2021 1526   ALBUMIN 4.3 01/26/2022 0013   ALBUMIN 4.4 01/27/2021  1526   AST 17 01/26/2022 0013   ALT 11 01/26/2022 0013   ALKPHOS 80 01/26/2022 0013   BILITOT 0.4 01/26/2022 0013   BILITOT 0.4 01/27/2021 1526   GFRNONAA >60 03/10/2022 1354   GFRNONAA 57 (L) 11/17/2017 0000   GFRAA >60 12/10/2019 0657   GFRAA 67 11/17/2017 0000       Component Value Date/Time   WBC 11.5 (H) 03/10/2022 1354   RBC 4.31 03/10/2022 1354   HGB 12.8 03/10/2022 1354   HGB 14.0 01/27/2021 1526   HCT 40.6 03/10/2022 1354   HCT 41.5 01/27/2021 1526   PLT 205 03/10/2022 1354   PLT 255 01/27/2021 1526   MCV 94.2 03/10/2022 1354   MCV 87 01/27/2021 1526   MCH 29.7 03/10/2022 1354   MCHC 31.5 03/10/2022 1354   RDW 13.5 03/10/2022 1354   RDW 13.6 01/27/2021 1526   LYMPHSABS 1.7 03/10/2022 1354   LYMPHSABS 1.9 01/27/2021  1526   MONOABS 0.6 03/10/2022 1354   EOSABS 0.5 03/10/2022 1354   EOSABS 0.1 01/27/2021 1526   BASOSABS 0.1 03/10/2022 1354   BASOSABS 0.0 01/27/2021 1526    Lithium Lvl  Date Value Ref Range Status  11/18/2021 0.7 0.6 - 1.2 mmol/L Final     Lab Results  Component Value Date   VALPROATE 57.0 11/18/2021     .res Assessment: Plan:    Pt seen for 23 minutes and time spent discussing potential benefits, risks, and side effects of increasing Depakote to improve insomnia and symptoms of a mixed episode. Pt agrees to increase in Depakote since she reports partial response to lower dose and improved tolerability compared to the past.  Will increase Depakote to 250 mg in the morning and mid-day and 750 mg at bedtime to stabilize mood.  Continue Alprazolam prn anxiety.  Continue aricept 10 mg daily for cognition. Continue Lithium ER 450 mg po QHS for mood stabilization.  Continue Seroquel 200 mg at 5 pm and at HS for mood.  Continue Ropinirole for RLS. Recommend continuing psychotherapy with Beckey Downing, LCAS. Pt to follow-up with this provider in 2 weeks or sooner if clinically indicated.  Patient advised to contact office with any questions,  adverse effects, or acute worsening in signs and symptoms.    Arizbeth was seen today for insomnia, depression and anxiety.  Diagnoses and all orders for this visit:  Bipolar 1 disorder ([place]) -     divalproex (DEPAKOTE) 250 MG DR tablet; Take 1 tablet (250 mg total) by mouth 2 (two) times daily AND 3 tablets (750 mg total) at bedtime.  Generalized anxiety disorder  Primary insomnia     Please see After Visit Summary for patient specific instructions.  Future Appointments  Date Time Provider [place]  04/21/2022 11:15 AM Elayne Snare, MD [id number] None    No orders of the defined types were placed in this encounter.   -------------------------------

## 2022-03-17 ENCOUNTER — Telehealth: Payer: Self-pay

## 2022-03-17 DIAGNOSIS — M47816 Spondylosis without myelopathy or radiculopathy, lumbar region: Secondary | ICD-10-CM | POA: Diagnosis not present

## 2022-03-17 NOTE — Telephone Encounter (Signed)
Patient has been notified

## 2022-03-17 NOTE — Telephone Encounter (Signed)
Patient called in wants to know if she can be put on Mounjaro. Please advise.

## 2022-03-20 DIAGNOSIS — R062 Wheezing: Secondary | ICD-10-CM | POA: Diagnosis not present

## 2022-03-20 DIAGNOSIS — R42 Dizziness and giddiness: Secondary | ICD-10-CM | POA: Diagnosis not present

## 2022-03-20 DIAGNOSIS — J988 Other specified respiratory disorders: Secondary | ICD-10-CM | POA: Diagnosis not present

## 2022-03-20 DIAGNOSIS — R059 Cough, unspecified: Secondary | ICD-10-CM | POA: Diagnosis not present

## 2022-03-26 ENCOUNTER — Other Ambulatory Visit: Payer: Self-pay

## 2022-03-26 DIAGNOSIS — E1165 Type 2 diabetes mellitus with hyperglycemia: Secondary | ICD-10-CM

## 2022-03-26 MED ORDER — LEVOTHYROXINE SODIUM 75 MCG PO TABS: 75.0000 ug | ORAL_TABLET | Freq: Every day | ORAL | 3 refills | Status: DC

## 2022-03-26 MED ORDER — LIOTHYRONINE SODIUM 5 MCG PO TABS: ORAL_TABLET | ORAL | 3 refills | Status: DC

## 2022-03-29 ENCOUNTER — Emergency Department (HOSPITAL_BASED_OUTPATIENT_CLINIC_OR_DEPARTMENT_OTHER): Payer: Medicare Other

## 2022-03-29 ENCOUNTER — Emergency Department (HOSPITAL_BASED_OUTPATIENT_CLINIC_OR_DEPARTMENT_OTHER): Payer: Medicare Other | Admitting: Radiology

## 2022-03-29 ENCOUNTER — Emergency Department (HOSPITAL_BASED_OUTPATIENT_CLINIC_OR_DEPARTMENT_OTHER)
Admission: EM | Admit: 2022-03-29 | Discharge: 2022-03-29 | Disposition: A | Discharge status: Home / Self Care | Payer: Medicare Other | Attending: Medical | Admitting: Medical

## 2022-03-29 ENCOUNTER — Other Ambulatory Visit: Payer: Self-pay

## 2022-03-29 DIAGNOSIS — R911 Solitary pulmonary nodule: Secondary | ICD-10-CM | POA: Insufficient documentation

## 2022-03-29 DIAGNOSIS — Z79899 Other long term (current) drug therapy: Secondary | ICD-10-CM | POA: Insufficient documentation

## 2022-03-29 DIAGNOSIS — Z1152 Encounter for screening for COVID-19: Secondary | ICD-10-CM | POA: Insufficient documentation

## 2022-03-29 DIAGNOSIS — R109 Unspecified abdominal pain: Secondary | ICD-10-CM

## 2022-03-29 DIAGNOSIS — R1032 Left lower quadrant pain: Secondary | ICD-10-CM | POA: Diagnosis not present

## 2022-03-29 DIAGNOSIS — R059 Cough, unspecified: Secondary | ICD-10-CM | POA: Diagnosis not present

## 2022-03-29 DIAGNOSIS — E039 Hypothyroidism, unspecified: Secondary | ICD-10-CM | POA: Insufficient documentation

## 2022-03-29 DIAGNOSIS — I7090 Unspecified atherosclerosis: Secondary | ICD-10-CM

## 2022-03-29 DIAGNOSIS — N1330 Unspecified hydronephrosis: Secondary | ICD-10-CM | POA: Insufficient documentation

## 2022-03-29 DIAGNOSIS — I10 Essential (primary) hypertension: Secondary | ICD-10-CM | POA: Diagnosis not present

## 2022-03-29 DIAGNOSIS — K5730 Diverticulosis of large intestine without perforation or abscess without bleeding: Secondary | ICD-10-CM | POA: Diagnosis not present

## 2022-03-29 DIAGNOSIS — E119 Type 2 diabetes mellitus without complications: Secondary | ICD-10-CM | POA: Diagnosis not present

## 2022-03-29 DIAGNOSIS — I700 Atherosclerosis of aorta: Secondary | ICD-10-CM | POA: Insufficient documentation

## 2022-03-29 DIAGNOSIS — R0602 Shortness of breath: Secondary | ICD-10-CM | POA: Diagnosis not present

## 2022-03-29 DIAGNOSIS — Z794 Long term (current) use of insulin: Secondary | ICD-10-CM | POA: Diagnosis not present

## 2022-03-29 DIAGNOSIS — K5790 Diverticulosis of intestine, part unspecified, without perforation or abscess without bleeding: Secondary | ICD-10-CM

## 2022-03-29 LAB — CBC
HCT: 42.9 % (ref 36.0–46.0)
Hemoglobin: 13.7 g/dL (ref 12.0–15.0)
MCH: 29.6 pg (ref 26.0–34.0)
MCHC: 31.9 g/dL (ref 30.0–36.0)
MCV: 92.7 fL (ref 80.0–100.0)
Platelets: 207 10*3/uL (ref 150–400)
RBC: 4.63 MIL/uL (ref 3.87–5.11)
RDW: 13.6 % (ref 11.5–15.5)
WBC: 6.4 10*3/uL (ref 4.0–10.5)
nRBC: 0 % (ref 0.0–0.2)

## 2022-03-29 LAB — COMPREHENSIVE METABOLIC PANEL
ALT: 14 U/L (ref 0–44)
AST: 14 U/L — ABNORMAL LOW (ref 15–41)
Albumin: 4.2 g/dL (ref 3.5–5.0)
Alkaline Phosphatase: 73 U/L (ref 38–126)
Anion gap: 8 (ref 5–15)
BUN: 10 mg/dL (ref 8–23)
CO2: 28 mmol/L (ref 22–32)
Calcium: 10.7 mg/dL — ABNORMAL HIGH (ref 8.9–10.3)
Chloride: 105 mmol/L (ref 98–111)
Creatinine, Ser: 0.85 mg/dL (ref 0.44–1.00)
GFR, Estimated: >60 mL/min (ref >60)
Glucose, Bld: 166 mg/dL — ABNORMAL HIGH (ref 70–99)
Potassium: 4.3 mmol/L (ref 3.5–5.1)
Sodium: 141 mmol/L (ref 135–145)
Total Bilirubin: 0.5 mg/dL (ref 0.3–1.2)
Total Protein: 6.5 g/dL (ref 6.5–8.1)

## 2022-03-29 LAB — URINALYSIS, ROUTINE W REFLEX MICROSCOPIC
Bilirubin Urine: NEGATIVE
Glucose, UA: NEGATIVE mg/dL
Hgb urine dipstick: NEGATIVE
Ketones, ur: NEGATIVE mg/dL
Leukocytes,Ua: NEGATIVE
Nitrite: NEGATIVE
Protein, ur: NEGATIVE mg/dL
Specific Gravity, Urine: 1.019 (ref 1.005–1.030)
pH: 6 (ref 5.0–8.0)

## 2022-03-29 LAB — RESP PANEL BY RT-PCR (RSV, FLU A&B, COVID)  RVPGX2
Influenza A by PCR: NEGATIVE
Influenza B by PCR: NEGATIVE
Resp Syncytial Virus by PCR: NEGATIVE
SARS Coronavirus 2 by RT PCR: NEGATIVE

## 2022-03-29 LAB — LIPASE, BLOOD: Lipase: 23 U/L (ref 11–51)

## 2022-03-29 MED ORDER — SODIUM CHLORIDE 0.9 % IV BOLUS
500.0000 mL | Freq: Once | INTRAVENOUS | Status: AC
  Administered 2022-03-29: 500 mL via INTRAVENOUS

## 2022-03-29 MED ORDER — ONDANSETRON HCL 4 MG/2ML IJ SOLN
2.0000 mg | Freq: Once | INTRAMUSCULAR | Status: AC
  Administered 2022-03-29: 2 mg via INTRAVENOUS
  Filled 2022-03-29: qty 2

## 2022-03-29 MED ORDER — IOHEXOL 300 MG/ML  SOLN
100.0000 mL | Freq: Once | INTRAMUSCULAR | Status: AC | PRN
  Administered 2022-03-29: 80 mL via INTRAVENOUS

## 2022-03-29 MED ORDER — FENTANYL CITRATE PF 50 MCG/ML IJ SOSY
50.0000 ug | PREFILLED_SYRINGE | Freq: Once | INTRAMUSCULAR | Status: AC
  Administered 2022-03-29: 50 ug via INTRAVENOUS
  Filled 2022-03-29: qty 1

## 2022-03-29 NOTE — ED Triage Notes (Signed)
Pt arrived POV. Pt caox4 and ambulatory. Pt c/o lower abd pain, R. Flank pain, and nausea x1 week. Pt also reports increased urinary frequency with minimal output. Pt states her abd pain feels consistent with diverticulitis flare ups in the past and the flank pain feels consistent with past UTIs.

## 2022-03-29 NOTE — Discharge Instructions (Addendum)
At this time there does not appear to be the presence of an emergent medical condition, however there is always the potential for conditions to change. Please read and follow the below instructions.  Please return to the Emergency Department immediately for any new or worsening symptoms or if your symptoms do not improve within 3 days. Please be sure to follow up with your Primary Care Provider within one week regarding your visit today; please call their office to schedule an appointment even if you are feeling better for a follow-up visit. Please call the urologist Dr. Jeffie Pollock to further discuss the fluid buildup in your right kidney. Please discuss the lung nodule seen on your CT scan with your primary care provider to see if further testing is needed.   Please read the additional information packets attached to your discharge summary.  Go to the nearest Emergency Department immediately if: You have fever or chills Your pain does not go away as soon as your doctor says it should. You cannot stop vomiting. Your pain is only in areas of your belly, such as the right side or the left lower part of the belly. You have bloody or black poop, or poop that looks like tar. You have very bad pain, cramping, or bloating in your belly. You have signs of not having enough fluid or water in your body (dehydration), such as: Dark pee, very little pee, or no pee. Cracked lips. Dry mouth. Sunken eyes. Sleepiness. Weakness. You have trouble breathing or chest pain. You have any new/concerning or worsening of symptoms.  Do not take your medicine if  develop an itchy rash, swelling in your mouth or lips, or difficulty breathing; call 911 and seek immediate emergency medical attention if this occurs.  You may review your lab tests and imaging results in their entirety on your MyChart account.  Please discuss all results of fully with your primary care provider and other specialist at your follow-up  visit.  Note: Portions of this text may have been transcribed using voice recognition software. Every effort was made to ensure accuracy; however, inadvertent computerized transcription errors may still be present.

## 2022-03-29 NOTE — ED Provider Notes (Addendum)
[place] EMERGENCY DEPARTMENT AT [facility] Provider Note   CSN: [id number] Arrival date & time: 03/29/22  4098     History  Chief Complaint  Patient presents with   Abdominal Pain    [name] [name] is a 77 y.o. female history includes type 2 diabetes, osteoarthritis, migraines, lupus, ischemic colitis, IBS, hypertension, hypothyroidism, hyperlipidemia, gastroparesis, fibromyalgia, diverticulosis, bipolar disorder, alcohol abuse.  Patient arrives today for evaluation of left lower quadrant abdominal pain, right flank pain and nausea ongoing x 1 week.  She reports symptoms have been constant since onset and gradually worsening she describes left lower quadrant abdominal pain consistent with prior episodes of diverticulitis pain is sharp and does not radiate worsens with palpation no alleviating factors.  Right flank pain is a constant throbbing pain as well, it does not radiate and is mild-moderate intensity she reports this feels similar to prior UTI.  She reports nausea without vomiting.  She denies chest pain or difficulty breathing.  She denies fall, injury, diarrhea, hematuria, hematochezia, fever or any additional concerns.  HPI     Home Medications Prior to Admission medications   Medication Sig Start Date End Date Taking? Authorizing Provider  acetaminophen (TYLENOL) 325 MG tablet Take 2 tablets (650 mg total) by mouth every 6 (six) hours as needed. Patient taking differently: Take 650 mg by mouth every 6 (six) hours as needed for mild pain. 02/11/21   Sloan Leiter, DO  ALPRAZolam Prudy Feeler) 0.5 MG tablet Take 1 tablet (0.5 mg total) by mouth 4 (four) times daily as needed for anxiety. 02/18/22   Corie Chiquito, PMHNP  atorvastatin (LIPITOR) 40 MG tablet Take 40 mg by mouth every evening.    [provider]  b complex vitamins capsule Take 1 capsule by mouth daily.    [provider]  BD PEN NEEDLE NANO 2ND GEN 32G X 4 MM MISC USE TO INJECT 5  TIMES DAILY AS DIRECTED 03/16/22   Reather Littler, MD  benzonatate (TESSALON) 100 MG capsule Take by mouth. 03/07/22   [provider]  bismuth subsalicylate (PEPTO BISMOL) 262 MG/15ML suspension Take 30 mLs by mouth every 6 (six) hours as needed.    [provider]  cholecalciferol (VITAMIN D3) 25 MCG (1000 UNIT) tablet Take 1,000 Units by mouth daily. Also takes 2,000 units capsule at bedtime    [provider]  CONTOUR NEXT TEST test strip USE TO TEST BLOOD SUGAR TWICE DAILY 08/01/21   Reather Littler, MD  cyclobenzaprine (FLEXERIL) 5 MG tablet Take 5 mg by mouth 3 (three) times daily as needed for muscle spasms.    [provider]  dicyclomine (BENTYL) 20 MG tablet Take 1 tablet (20 mg total) by mouth 3 (three) times daily as needed for spasms. 06/17/21   Burnadette Pop, MD  diphenhydrAMINE-PE-APAP [facility] SEVERE COLD NIGHTTIME PO) Take by mouth.    [provider]  divalproex (DEPAKOTE) 250 MG DR tablet Take 1 tablet (250 mg total) by mouth 2 (two) times daily AND 3 tablets (750 mg total) at bedtime. 03/16/22 05/15/22  Corie Chiquito, PMHNP  donepezil (ARICEPT) 10 MG tablet Take 1 tablet (10 mg total) by mouth at bedtime. 01/21/22   Corie Chiquito, PMHNP  doxycycline (VIBRA-TABS) 100 MG tablet Take 100 mg by mouth 2 (two) times daily. 03/09/22   [provider]  exenatide (BYETTA 5 MCG PEN) 5 MCG/0.02ML SOPN injection Inject 5 mcg into the skin 2 (two) times daily with a meal. Patient not taking: Reported on  01/21/2022 11/26/21   Reather Littler, MD  ezetimibe (ZETIA) 10 MG tablet Take 1 tablet (10 mg total) by mouth daily. 10/07/21   Reather Littler, MD  fluticasone Aleda Grana) 50 MCG/ACT nasal spray Place into both nostrils as needed for allergies or rhinitis.    [provider]  gabapentin (NEURONTIN) 600 MG tablet Take 600 mg by mouth 3 (three) times daily. 05/12/19   [provider]  ibuprofen (ADVIL) 800 MG tablet Take 800 mg by mouth every 8  (eight) hours as needed for moderate pain (following dental implants).    [provider]  insulin lispro (HUMALOG KWIKPEN) 100 UNIT/ML KwikPen ADMINISTER 12 UNITS UNDER THE SKIN BEFORE MEALS 03/13/22   Reather Littler, MD  LANTUS SOLOSTAR 100 UNIT/ML Solostar Pen ADMINISTER 66 UNITS UNDER THE SKIN AT BEDTIME 01/26/22   Reather Littler, MD  levothyroxine (SYNTHROID) 75 MCG tablet Take 1 tablet (75 mcg total) by mouth daily before breakfast. 03/26/22   Reather Littler, MD  liothyronine (CYTOMEL) 5 MCG tablet TAKE 1 TABLET BY MOUTH DAILY WITH LEVOTHYROXINE  TAKE BEFORE BREAKFAST 03/26/22   Reather Littler, MD  lithium carbonate (ESKALITH) 450 MG ER tablet Take 1 tablet (450 mg total) by mouth at bedtime. 02/26/22   Corie Chiquito, PMHNP  loperamide (IMODIUM) 2 MG capsule Take 2 capsules (4 mg total) by mouth 3 (three) times daily as needed for diarrhea or loose stools. 11/12/21   Danford, Earl Lites, MD  loratadine (CLARITIN) 10 MG tablet Take 10 mg by mouth daily as needed for allergies.    [provider]  losartan (COZAAR) 25 MG tablet Take 25 mg by mouth every evening. 08/31/19   [provider]  memantine (NAMENDA) 10 MG tablet TAKE 1 TABLET(10 MG) BY MOUTH EVERY EVENING 02/26/22   Corie Chiquito, PMHNP  methocarbamol (ROBAXIN) 500 MG tablet Take 1 tablet (500 mg total) by mouth every 8 (eight) hours as needed for muscle spasms. Patient not taking: Reported on 01/21/2022 11/26/21   Glynn Octave, MD  Microlet Lancets MISC TEST DAILY AS DIRECTED 08/01/21   Reather Littler, MD  nitrofurantoin, macrocrystal-monohydrate, (MACROBID) 100 MG capsule Take 1 capsule (100 mg total) by mouth 2 (two) times daily. 01/26/22   Pollyann Savoy, MD  nitroGLYCERIN (NITROSTAT) 0.4 MG SL tablet Place 1 tablet (0.4 mg total) under the tongue every 5 (five) minutes as needed for chest pain. 09/14/17   Robbie Lis M, PA-C  ondansetron (ZOFRAN) 4 MG tablet Take 1 tablet (4 mg total) by mouth every 6 (six)  hours. 09/20/21   Curatolo, Adam, DO  oxyCODONE (ROXICODONE) 5 MG immediate release tablet Take 1 tablet (5 mg total) by mouth every 6 (six) hours as needed for up to 10 doses for breakthrough pain. Patient not taking: Reported on 01/21/2022 07/04/21   Virgina Norfolk, DO  oxyCODONE-acetaminophen (PERCOCET/ROXICET) 5-325 MG tablet Take 1 tablet by mouth every 6 (six) hours as needed for severe pain. Patient not taking: Reported on 01/21/2022 06/17/21   Burnadette Pop, MD  PEG 3350-KCl-NaBcb-NaCl-NaSulf (PEG-3350/ELECTROLYTES) 236 g SOLR Take 17 g by mouth daily as needed. 10/06/21   [provider]  phenazopyridine (PYRIDIUM) 200 MG tablet Take 1 tablet (200 mg total) by mouth 3 (three) times daily. Patient not taking: Reported on 01/21/2022 12/12/21   Tanda Rockers A, DO  QUEtiapine (SEROQUEL) 200 MG tablet TAKE 1 TAB PO q 5 pm and 1 tab po QHS 02/26/22   Corie Chiquito, PMHNP  rOPINIRole (REQUIP) 0.5 MG tablet Takes 4 tabs  at 5 pm and 3 tabs at bedtime and 1-2 tabs prn 02/26/22   Corie Chiquito, PMHNP  vancomycin (VANCOCIN) 125 MG capsule Take 1 capsule (125 mg total) by mouth 2 (two) times daily. Patient not taking: Reported on 01/21/2022 11/12/21   Alberteen Sam, MD  Wheat Dextrin (BENEFIBER PO) Take 1 Dose by mouth daily. Patient not taking: Reported on 01/21/2022    [provider]      Allergies    Codeine, Procaine hcl, Epinephrine, Aspirin, Augmentin [amoxicillin-pot clavulanate], Benadryl [diphenhydramine], Diflucan [fluconazole], Dilaudid [hydromorphone hcl], Hydromorphone, Morphine, Other, Prednisone, Tramadol hcl, and Sulfa antibiotics    Review of Systems   Review of Systems Ten systems are reviewed and are negative for acute change except as noted in the HPI  Physical Exam Updated Vital Signs BP 131/64   Pulse 76   Temp 98 F (36.7 C) (Oral)   Resp 13   SpO2 96%  Physical Exam Constitutional:      General: She is not in acute distress.     Appearance: Normal appearance. She is well-developed. She is not ill-appearing or diaphoretic.  HENT:     Head: Normocephalic and atraumatic.  Eyes:     General: Vision grossly intact. Gaze aligned appropriately.     Pupils: Pupils are equal, round, and reactive to light.  Neck:     Trachea: Trachea and phonation normal.  Cardiovascular:     Rate and Rhythm: Normal rate and regular rhythm.  Pulmonary:     Effort: Pulmonary effort is normal. No respiratory distress.     Breath sounds: Normal breath sounds and air entry.  Abdominal:     General: There is no distension.     Palpations: Abdomen is soft.     Tenderness: There is abdominal tenderness in the left lower quadrant. There is right CVA tenderness. There is no guarding or rebound.  Musculoskeletal:        General: Normal range of motion.     Cervical back: Normal range of motion.  Skin:    General: Skin is warm and dry.  Neurological:     Mental Status: She is alert.     GCS: GCS eye subscore is 4. GCS verbal subscore is 5. GCS motor subscore is 6.     Comments: Speech is clear and goal oriented, follows commands Major Cranial nerves without deficit, no facial droop Moves extremities without ataxia, coordination intact  Psychiatric:        Behavior: Behavior normal.     ED Results / Procedures / Treatments   Labs (all labs ordered are listed, but only abnormal results are displayed) Labs Reviewed  COMPREHENSIVE METABOLIC PANEL - Abnormal; Notable for the following components:      Result Value   Glucose, Bld 166 (*)    Calcium 10.7 (*)    AST 14 (*)    All other components within normal limits  RESP PANEL BY RT-PCR (RSV, FLU A&B, COVID)  RVPGX2  LIPASE, BLOOD  CBC  URINALYSIS, ROUTINE W REFLEX MICROSCOPIC    EKG None  Radiology CT ABDOMEN PELVIS W CONTRAST  Result Date: 03/29/2022 CLINICAL DATA:  Diverticulitis.  Complications suspected. EXAM: CT ABDOMEN AND PELVIS WITH CONTRAST TECHNIQUE: Multidetector CT  imaging of the abdomen and pelvis was performed using the standard protocol following bolus administration of intravenous contrast. RADIATION DOSE REDUCTION: This exam was performed according to the departmental dose-optimization program which includes automated exposure control, adjustment of the mA and/or kV according to patient  size and/or use of iterative reconstruction technique. CONTRAST:  80mL OMNIPAQUE IOHEXOL 300 MG/ML  SOLN COMPARISON:  01/26/2022 FINDINGS: Lower chest: New nodule is identified within the right base measuring 4 mm, image 6/4. No pleural fluid or airspace disease. Hepatobiliary: No focal liver abnormality is seen. No gallstones, gallbladder wall thickening, or biliary dilatation. Pancreas: Unremarkable. No pancreatic ductal dilatation or surrounding inflammatory changes. Spleen: Normal in size without focal abnormality. Adrenals/Urinary Tract: Normal appearance of the adrenal glands. No nephrolithiasis identified bilaterally. No suspicious kidney mass. Unchanged appearance of mild right hydronephrosis up to the level of the UPJ no obstructing stone or mass visualized. Urinary bladder is partially obscured by streak artifact from right hip arthroplasty device. No focal abnormality noted. Stomach/Bowel: Stomach appears normal. The appendix is not visualized compatible with prior appendectomy. Status post sigmoid resection. Scattered colonic diverticula noted without signs of acute diverticulitis. Vascular/Lymphatic: Mild aortic atherosclerosis. No abdominopelvic adenopathy. Reproductive: Status post hysterectomy. No adnexal masses. Other: No free fluid or fluid collections identified. No signs of pneumoperitoneum. Musculoskeletal: Right hip arthroplasty. No acute or suspicious osseous findings. IMPRESSION: 1. No acute findings within the abdomen or pelvis. 2. Colonic diverticulosis without signs of acute diverticulitis. 3. Unchanged appearance of mild right hydronephrosis up to the level of  the UPJ. No obstructing stone or mass visualized. Findings are favored to represent chronic UPJ stenosis. 4. New 4 mm nodule is identified within the right base. No follow-up needed if patient is low-risk. Non-contrast chest CT can be considered in 12 months if patient is high-risk. This recommendation follows the consensus statement: Guidelines for Management of Incidental Pulmonary Nodules Detected on CT Images: From the Fleischner Society 2017; Radiology 2017; 284:228-243. 5.  Aortic Atherosclerosis (ICD10-I70.0). Electronically Signed   By: Signa Kell M.D.   On: 03/29/2022 11:35   DG Chest 2 View  Result Date: 03/29/2022 CLINICAL DATA:  Cough and shortness of breath. EXAM: CHEST - 2 VIEW COMPARISON:  Chest radiograph 03/10/2022 FINDINGS: Monitoring leads overlie the patient. Stable cardiac and mediastinal contours. Aortic tortuosity. Elevation right hemidiaphragm. No large area of pulmonary consolidation. No pleural effusion or pneumothorax. Thoracic spine degenerative changes. IMPRESSION: No active cardiopulmonary disease. Electronically Signed   By: Annia Belt M.D.   On: 03/29/2022 11:28    Procedures Procedures    Medications Ordered in ED Medications  fentaNYL (SUBLIMAZE) injection 50 mcg (50 mcg Intravenous Given 03/29/22 1130)  ondansetron (ZOFRAN) injection 2 mg (2 mg Intravenous Given 03/29/22 1130)  sodium chloride 0.9 % bolus 500 mL (0 mLs Intravenous Stopped 03/29/22 1224)  iohexol (OMNIPAQUE) 300 MG/ML solution 100 mL (80 mLs Intravenous Contrast Given 03/29/22 1116)    ED Course/ Medical Decision Making/ A&P                             Medical Decision Making 77 year old female history as above presented for left lower quadrant and right flank pain ongoing x 1 week.  She reports this feels similar to prior episodes of diverticulitis and UTI.  She has no history of fever chills or trauma.  No nausea, vomiting, diarrhea or hematochezia.  No melena.  No dysuria.  No chest pain  or shortness of breath.  No additional concerns.  Differential includes but not limited to diverticulitis, SBO, appendicitis, cholecystitis, UTI, pyelonephritis.  Amount and/or Complexity of Data Reviewed External Data Reviewed: notes.    Details: Patient seen in ER on 03/10/2021 for diagnosis of viral upper respiratory  tract infection.  COVID/flu/RSV panel was negative at that time.  She had a negative chest x-ray.  She was discharged after she felt better with IV fluids and albuterol.  She was noted to desat to the mid 80s when sleeping they suspect this is due to patient position. Labs: ordered.    Details: CBC within normal limits.  No leukocytosis to suggest infectious process.  No anemia or thrombocytopenia. CMP shows no emergent electrolyte derangement, AKI, LFT elevations or gap. Lipase within normal limits, doubt pancreatitis. Urinalysis normal limits, no evidence for UTI. COVID/flu/RSV panel negative Radiology: ordered.    Details: I have personally reviewed and interpreted patient's two-view chest x-ray.  I do not appreciate any obvious PTX, PNA or pleural effusion. I have personally reviewed and interpreted patient's CT abdomen pelvis.  I agree with radiologist that patient has right hydronephrosis.  I do not appreciate any obvious kidney stone.  I do not appreciate any obvious SBO or free air.  Please see radiology interpretation. ECG/medicine tests: independent interpretation performed.    Details: I have personally reviewed and interpreted patient's twelve-lead EKG.  I do not appreciate any obvious acute ischemic changes.  Risk Prescription drug management.    At this time there does not appear to be any evidence of an acute emergency medical condition and the patient appears stable for discharge with appropriate outpatient follow up. Diagnosis was discussed with patient who verbalizes understanding of care plan and is agreeable to discharge. I have discussed return precautions  with patient who verbalizes understanding. Patient encouraged to follow-up with their PCP and urology. All questions answered.  Patient's case discussed with Dr. Deretha Emory who agrees with plan to discharge with outpatient follow-up.  === Of note patient denies having legal guardian.  She is fully alert and oriented and drove herself here today.  She is ambulatory in the ER without assistance or difficulty.  All questions answered.  RN Marchelle Folks verified  Note: Portions of this report may have been transcribed using voice recognition software. Every effort was made to ensure accuracy; however, inadvertent computerized transcription errors may still be present.         Final Clinical Impression(s) / ED Diagnoses Final diagnoses:  Abdominal pain, unspecified abdominal location  Hydronephrosis, unspecified hydronephrosis type  Lung nodule  Atherosclerosis  Diverticulosis    Rx / DC Orders ED Discharge Orders     None         Bill Salinas, PA-C 03/29/22 1321    [address] 03/29/22 1326    Vanetta Mulders, MD 04/01/22 [phone]

## 2022-03-31 ENCOUNTER — Encounter (HOSPITAL_BASED_OUTPATIENT_CLINIC_OR_DEPARTMENT_OTHER): Payer: Self-pay | Admitting: Emergency Medicine

## 2022-03-31 ENCOUNTER — Emergency Department (HOSPITAL_BASED_OUTPATIENT_CLINIC_OR_DEPARTMENT_OTHER): Payer: Medicare Other

## 2022-03-31 ENCOUNTER — Other Ambulatory Visit: Payer: Self-pay

## 2022-03-31 ENCOUNTER — Emergency Department (HOSPITAL_BASED_OUTPATIENT_CLINIC_OR_DEPARTMENT_OTHER)
Admission: EM | Admit: 2022-03-31 | Discharge: 2022-03-31 | Disposition: A | Discharge status: Home / Self Care | Payer: Medicare Other | Attending: Emergency Medicine | Admitting: Emergency Medicine

## 2022-03-31 DIAGNOSIS — Z471 Aftercare following joint replacement surgery: Secondary | ICD-10-CM | POA: Diagnosis not present

## 2022-03-31 DIAGNOSIS — Z794 Long term (current) use of insulin: Secondary | ICD-10-CM | POA: Insufficient documentation

## 2022-03-31 DIAGNOSIS — Z96641 Presence of right artificial hip joint: Secondary | ICD-10-CM | POA: Diagnosis not present

## 2022-03-31 DIAGNOSIS — M25551 Pain in right hip: Secondary | ICD-10-CM | POA: Diagnosis not present

## 2022-03-31 LAB — BASIC METABOLIC PANEL
Anion gap: 10 (ref 5–15)
BUN: 14 mg/dL (ref 8–23)
CO2: 26 mmol/L (ref 22–32)
Calcium: 10.7 mg/dL — ABNORMAL HIGH (ref 8.9–10.3)
Chloride: 103 mmol/L (ref 98–111)
Creatinine, Ser: 0.88 mg/dL (ref 0.44–1.00)
GFR, Estimated: >60 mL/min (ref >60)
Glucose, Bld: 137 mg/dL — ABNORMAL HIGH (ref 70–99)
Potassium: 4.2 mmol/L (ref 3.5–5.1)
Sodium: 139 mmol/L (ref 135–145)

## 2022-03-31 LAB — CBC WITH DIFFERENTIAL/PLATELET
Abs Immature Granulocytes: 0.04 10*3/uL (ref 0.00–0.07)
Basophils Absolute: 0 10*3/uL (ref 0.0–0.1)
Basophils Relative: 1 %
Eosinophils Absolute: 0.3 10*3/uL (ref 0.0–0.5)
Eosinophils Relative: 4 %
HCT: 45.9 % (ref 36.0–46.0)
Hemoglobin: 14.5 g/dL (ref 12.0–15.0)
Immature Granulocytes: 1 %
Lymphocytes Relative: 28 %
Lymphs Abs: 2.2 10*3/uL (ref 0.7–4.0)
MCH: 29.2 pg (ref 26.0–34.0)
MCHC: 31.6 g/dL (ref 30.0–36.0)
MCV: 92.5 fL (ref 80.0–100.0)
Monocytes Absolute: 0.5 10*3/uL (ref 0.1–1.0)
Monocytes Relative: 7 %
Neutro Abs: 4.7 10*3/uL (ref 1.7–7.7)
Neutrophils Relative %: 59 %
Platelets: 256 10*3/uL (ref 150–400)
RBC: 4.96 MIL/uL (ref 3.87–5.11)
RDW: 14 % (ref 11.5–15.5)
WBC: 7.8 10*3/uL (ref 4.0–10.5)
nRBC: 0 % (ref 0.0–0.2)

## 2022-03-31 MED ORDER — KETOROLAC TROMETHAMINE 60 MG/2ML IM SOLN
30.0000 mg | Freq: Once | INTRAMUSCULAR | Status: AC
  Administered 2022-03-31: 30 mg via INTRAMUSCULAR
  Filled 2022-03-31: qty 2

## 2022-03-31 MED ORDER — OXYCODONE-ACETAMINOPHEN 5-325 MG PO TABS
1.0000 | ORAL_TABLET | ORAL | Status: AC | PRN
  Administered 2022-03-31 (×2): 1 via ORAL
  Filled 2022-03-31 (×2): qty 1

## 2022-03-31 MED ORDER — OXYCODONE-ACETAMINOPHEN 5-325 MG PO TABS: 1.0000 | ORAL_TABLET | Freq: Four times a day (QID) | ORAL | 0 refills | Status: DC | PRN

## 2022-03-31 NOTE — ED Provider Notes (Signed)
[facility] Provider Note   CSN: [id number] Arrival date & time: 03/31/22  1543     History {Add pertinent medical, surgical, social history, OB history to HPI:1} Chief Complaint  Patient presents with   Back Pain    [name] [name] is a 77 y.o. female.   Back Pain      Home Medications Prior to Admission medications   Medication Sig Start Date End Date Taking? Authorizing Provider  acetaminophen (TYLENOL) 325 MG tablet Take 2 tablets (650 mg total) by mouth every 6 (six) hours as needed. Patient taking differently: Take 650 mg by mouth every 6 (six) hours as needed for mild pain. 02/11/21   Jeanell Sparrow, DO  ALPRAZolam Duanne Moron) 0.5 MG tablet Take 1 tablet (0.5 mg total) by mouth 4 (four) times daily as needed for anxiety. 02/18/22   [name], PMHNP  atorvastatin (LIPITOR) 40 MG tablet Take 40 mg by mouth every evening.    [provider]  b complex vitamins capsule Take 1 capsule by mouth daily.    [provider]  BD PEN NEEDLE NANO 2ND GEN 32G X 4 MM MISC USE TO INJECT 5 TIMES DAILY AS DIRECTED 03/16/22   Elayne Snare, MD  benzonatate (TESSALON) 100 MG capsule Take by mouth. 03/07/22   [provider]  bismuth subsalicylate (PEPTO BISMOL) 262 MG/15ML suspension Take 30 mLs by mouth every 6 (six) hours as needed.    [provider]  cholecalciferol (VITAMIN D3) 25 MCG (1000 UNIT) tablet Take 1,000 Units by mouth daily. Also takes 2,000 units capsule at bedtime    [provider]  CONTOUR NEXT TEST test strip USE TO TEST BLOOD SUGAR TWICE DAILY 08/01/21   Elayne Snare, MD  cyclobenzaprine (FLEXERIL) 5 MG tablet Take 5 mg by mouth 3 (three) times daily as needed for muscle spasms.    [provider]  dicyclomine (BENTYL) 20 MG tablet Take 1 tablet (20 mg total) by mouth 3 (three) times daily as needed for spasms. 06/17/21   Shelly Coss, MD  diphenhydrAMINE-PE-APAP [facility]  SEVERE COLD NIGHTTIME PO) Take by mouth.    [provider]  divalproex (DEPAKOTE) 250 MG DR tablet Take 1 tablet (250 mg total) by mouth 2 (two) times daily AND 3 tablets (750 mg total) at bedtime. 03/16/22 05/15/22  [name], PMHNP  donepezil (ARICEPT) 10 MG tablet Take 1 tablet (10 mg total) by mouth at bedtime. 01/21/22   [name], PMHNP  doxycycline (VIBRA-TABS) 100 MG tablet Take 100 mg by mouth 2 (two) times daily. 03/09/22   [provider]  exenatide (BYETTA 5 MCG PEN) 5 MCG/0.02ML SOPN injection Inject 5 mcg into the skin 2 (two) times daily with a meal. Patient not taking: Reported on 01/21/2022 11/26/21   Elayne Snare, MD  ezetimibe (ZETIA) 10 MG tablet Take 1 tablet (10 mg total) by mouth daily. 10/07/21   Elayne Snare, MD  fluticasone Asencion Islam) 50 MCG/ACT nasal spray Place into both nostrils as needed for allergies or rhinitis.    [provider]  gabapentin (NEURONTIN) 600 MG tablet Take 600 mg by mouth 3 (three) times daily. 05/12/19   [provider]  ibuprofen (ADVIL) 800 MG tablet Take 800 mg by mouth every 8 (eight) hours as needed for moderate pain (following dental implants).    [provider]  insulin lispro (HUMALOG KWIKPEN) 100 UNIT/ML KwikPen ADMINISTER 12 UNITS UNDER THE SKIN BEFORE MEALS 03/13/22   Elayne Snare,  MD  LANTUS SOLOSTAR 100 UNIT/ML Solostar Pen ADMINISTER 66 UNITS UNDER THE SKIN AT BEDTIME 01/26/22   Elayne Snare, MD  levothyroxine (SYNTHROID) 75 MCG tablet Take 1 tablet (75 mcg total) by mouth daily before breakfast. 03/26/22   Elayne Snare, MD  liothyronine (CYTOMEL) 5 MCG tablet TAKE 1 TABLET BY MOUTH DAILY WITH LEVOTHYROXINE  TAKE BEFORE BREAKFAST 03/26/22   Elayne Snare, MD  lithium carbonate (ESKALITH) 450 MG ER tablet Take 1 tablet (450 mg total) by mouth at bedtime. 02/26/22   [name], PMHNP  loperamide (IMODIUM) 2 MG capsule Take 2 capsules (4 mg total) by mouth 3 (three) times daily as needed for diarrhea  or loose stools. 11/12/21   Danford, Suann Larry, MD  loratadine (CLARITIN) 10 MG tablet Take 10 mg by mouth daily as needed for allergies.    [provider]  losartan (COZAAR) 25 MG tablet Take 25 mg by mouth every evening. 08/31/19   [provider]  memantine (NAMENDA) 10 MG tablet TAKE 1 TABLET(10 MG) BY MOUTH EVERY EVENING 02/26/22   [name], PMHNP  methocarbamol (ROBAXIN) 500 MG tablet Take 1 tablet (500 mg total) by mouth every 8 (eight) hours as needed for muscle spasms. Patient not taking: Reported on 01/21/2022 11/26/21   Ezequiel Essex, MD  Microlet Lancets MISC TEST DAILY AS DIRECTED 08/01/21   Elayne Snare, MD  nitrofurantoin, macrocrystal-monohydrate, (MACROBID) 100 MG capsule Take 1 capsule (100 mg total) by mouth 2 (two) times daily. 01/26/22   Truddie Hidden, MD  nitroGLYCERIN (NITROSTAT) 0.4 MG SL tablet Place 1 tablet (0.4 mg total) under the tongue every 5 (five) minutes as needed for chest pain. 09/14/17   Lyda Jester M, PA-C  ondansetron (ZOFRAN) 4 MG tablet Take 1 tablet (4 mg total) by mouth every 6 (six) hours. 09/20/21   Curatolo, Adam, DO  oxyCODONE (ROXICODONE) 5 MG immediate release tablet Take 1 tablet (5 mg total) by mouth every 6 (six) hours as needed for up to 10 doses for breakthrough pain. Patient not taking: Reported on 01/21/2022 07/04/21   Lennice Sites, DO  oxyCODONE-acetaminophen (PERCOCET/ROXICET) 5-325 MG tablet Take 1 tablet by mouth every 6 (six) hours as needed for severe pain. Patient not taking: Reported on 01/21/2022 06/17/21   Shelly Coss, MD  PEG 3350-KCl-NaBcb-NaCl-NaSulf (PEG-3350/ELECTROLYTES) 236 g SOLR Take 17 g by mouth daily as needed. 10/06/21   [provider]  phenazopyridine (PYRIDIUM) 200 MG tablet Take 1 tablet (200 mg total) by mouth 3 (three) times daily. Patient not taking: Reported on 01/21/2022 12/12/21   Wynona Dove A, DO  QUEtiapine (SEROQUEL) 200 MG tablet TAKE 1 TAB PO q 5 pm and 1 tab  po QHS 02/26/22   [name], PMHNP  rOPINIRole (REQUIP) 0.5 MG tablet Takes 4 tabs at 5 pm and 3 tabs at bedtime and 1-2 tabs prn 02/26/22   [name], PMHNP  vancomycin (VANCOCIN) 125 MG capsule Take 1 capsule (125 mg total) by mouth 2 (two) times daily. Patient not taking: Reported on 01/21/2022 11/12/21   Edwin Dada, MD  Wheat Dextrin (BENEFIBER PO) Take 1 Dose by mouth daily. Patient not taking: Reported on 01/21/2022    [provider]      Allergies    Codeine, Procaine hcl, Epinephrine, Aspirin, Augmentin [amoxicillin-pot clavulanate], Benadryl [diphenhydramine], Diflucan [fluconazole], Dilaudid [hydromorphone hcl], Hydromorphone, Morphine, Other, Prednisone, Tramadol hcl, and Sulfa antibiotics    Review of Systems   Review of Systems  Musculoskeletal:  Positive for back  pain.    Physical Exam Updated Vital Signs BP (!) 158/88 (BP Location: Right Arm)   Pulse 98   Temp 97.9 F (36.6 C)   Resp (!) 22   SpO2 97%  Physical Exam  ED Results / Procedures / Treatments   Labs (all labs ordered are listed, but only abnormal results are displayed) Labs Reviewed - No data to display  EKG None  Radiology No results found.  Procedures Procedures  {Document cardiac monitor, telemetry assessment procedure when appropriate:1}  Medications Ordered in ED Medications  oxyCODONE-acetaminophen (PERCOCET/ROXICET) 5-325 MG per tablet 1 tablet (1 tablet Oral Given 03/31/22 1600)    ED Course/ Medical Decision Making/ A&P   {   Click here for ABCD2, HEART and other calculatorsREFRESH Note before signing :1}                          Medical Decision Making Risk Prescription drug management.   ***  {Document critical care time when appropriate:1} {Document review of labs and clinical decision tools ie heart score, Chads2Vasc2 etc:1}  {Document your independent review of radiology images, and any outside records:1} {Document your discussion with  family members, caretakers, and with consultants:1} {Document social determinants of health affecting pt's care:1} {Document your decision making why or why not admission, treatments were needed:1} Final Clinical Impression(s) / ED Diagnoses Final diagnoses:  None    Rx / DC Orders ED Discharge Orders     None

## 2022-03-31 NOTE — ED Triage Notes (Signed)
Lower back pain for few days, can't walk per pt due to pain.

## 2022-03-31 NOTE — Discharge Instructions (Signed)
Please follow-up outpatient with orthopedics and your PCP for further pain management related to your hip prosthesis.  Your CT imaging was reassuring.  Your laboratory evaluation pointing away from infection of the hip.  You have intact range of motion are able to ambulate.  Your strength and sensation is intact.  Please return to the emergency department for any acute weakness of a lower extremity, urinary or fecal incontinence, numbness around your groin, any new falls or trauma.  Recommend NSAIDs and a course of opiates for pain control and follow-up outpatient.

## 2022-03-31 NOTE — ED Notes (Signed)
Pt ambulated in hallway, rates right lower back and hip pain 8/10.

## 2022-03-31 NOTE — ED Notes (Signed)
Patient transported to CT 

## 2022-03-31 NOTE — ED Provider Notes (Incomplete)
[facility] Provider Note   CSN: [id number] Arrival date & time: 03/31/22  1543     History  Chief Complaint  Patient presents with  . Back Pain    [name] [name] is a 77 y.o. female.   Back Pain    77 year old female who presents to the emergency department with right hip pain.  The patient states that she was seen in the emergency department 2 days ago on 03/29/2022 for generalized abdominal pain during which time she had CT imaging which revealed no abnormality of her lumbar spine.  She states that she traveled to [place] for her daughter's 50th birthday and was ambulatory frequently.  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  She denies any recent falls or trauma.  She denies any numbness, weakness, radicular symptoms.  She denies any urinary incontinence or fecal incontinence.  She thinks she may have overdone it with walking around [place] and has exacerbated chronic pain in her back and right hip.  She denies any fevers or chills or urinary symptoms.  Home Medications Prior to Admission medications   Medication Sig Start Date End Date Taking? Authorizing Provider  acetaminophen (TYLENOL) 325 MG tablet Take 2 tablets (650 mg total) by mouth every 6 (six) hours as needed. Patient taking differently: Take 650 mg by mouth every 6 (six) hours as needed for mild pain. 02/11/21   Jeanell Sparrow, DO  ALPRAZolam Duanne Moron) 0.5 MG tablet Take 1 tablet (0.5 mg total) by mouth 4 (four) times daily as needed for anxiety. 02/18/22   [name], PMHNP  atorvastatin (LIPITOR) 40 MG tablet Take 40 mg by mouth every evening.    [provider]  b complex vitamins capsule Take 1 capsule by mouth daily.    [provider]  BD PEN NEEDLE NANO 2ND GEN 32G X 4 MM MISC USE TO INJECT 5 TIMES DAILY AS DIRECTED 03/16/22   Elayne Snare, MD  benzonatate (TESSALON) 100 MG capsule Take by mouth. 03/07/22   [provider]   bismuth subsalicylate (PEPTO BISMOL) 262 MG/15ML suspension Take 30 mLs by mouth every 6 (six) hours as needed.    [provider]  cholecalciferol (VITAMIN D3) 25 MCG (1000 UNIT) tablet Take 1,000 Units by mouth daily. Also takes 2,000 units capsule at bedtime    [provider]  CONTOUR NEXT TEST test strip USE TO TEST BLOOD SUGAR TWICE DAILY 08/01/21   Elayne Snare, MD  cyclobenzaprine (FLEXERIL) 5 MG tablet Take 5 mg by mouth 3 (three) times daily as needed for muscle spasms.    [provider]  dicyclomine (BENTYL) 20 MG tablet Take 1 tablet (20 mg total) by mouth 3 (three) times daily as needed for spasms. 06/17/21   Shelly Coss, MD  diphenhydrAMINE-PE-APAP [facility] SEVERE COLD NIGHTTIME PO) Take by mouth.    [provider]  divalproex (DEPAKOTE) 250 MG DR tablet Take 1 tablet (250 mg total) by mouth 2 (two) times daily AND 3 tablets (750 mg total) at bedtime. 03/16/22 05/15/22  [name], PMHNP  donepezil (ARICEPT) 10 MG tablet Take 1 tablet (10 mg total) by mouth at bedtime. 01/21/22   [name], PMHNP  doxycycline (VIBRA-TABS) 100 MG tablet Take 100 mg by mouth 2 (two) times daily. 03/09/22   [provider]  exenatide (BYETTA 5 MCG PEN) 5 MCG/0.02ML SOPN injection Inject 5 mcg into the skin 2 (two) times daily with a meal. Patient not taking:  Reported on 01/21/2022 11/26/21   Elayne Snare, MD  ezetimibe (ZETIA) 10 MG tablet Take 1 tablet (10 mg total) by mouth daily. 10/07/21   Elayne Snare, MD  fluticasone Asencion Islam) 50 MCG/ACT nasal spray Place into both nostrils as needed for allergies or rhinitis.    [provider]  gabapentin (NEURONTIN) 600 MG tablet Take 600 mg by mouth 3 (three) times daily. 05/12/19   [provider]  ibuprofen (ADVIL) 800 MG tablet Take 800 mg by mouth every 8 (eight) hours as needed for moderate pain (following dental implants).    [provider]  insulin lispro (HUMALOG KWIKPEN) 100  UNIT/ML KwikPen ADMINISTER 12 UNITS UNDER THE SKIN BEFORE MEALS 03/13/22   Elayne Snare, MD  LANTUS SOLOSTAR 100 UNIT/ML Solostar Pen ADMINISTER 66 UNITS UNDER THE SKIN AT BEDTIME 01/26/22   Elayne Snare, MD  levothyroxine (SYNTHROID) 75 MCG tablet Take 1 tablet (75 mcg total) by mouth daily before breakfast. 03/26/22   Elayne Snare, MD  liothyronine (CYTOMEL) 5 MCG tablet TAKE 1 TABLET BY MOUTH DAILY WITH LEVOTHYROXINE  TAKE BEFORE BREAKFAST 03/26/22   Elayne Snare, MD  lithium carbonate (ESKALITH) 450 MG ER tablet Take 1 tablet (450 mg total) by mouth at bedtime. 02/26/22   [name], PMHNP  loperamide (IMODIUM) 2 MG capsule Take 2 capsules (4 mg total) by mouth 3 (three) times daily as needed for diarrhea or loose stools. 11/12/21   Danford, Suann Larry, MD  loratadine (CLARITIN) 10 MG tablet Take 10 mg by mouth daily as needed for allergies.    [provider]  losartan (COZAAR) 25 MG tablet Take 25 mg by mouth every evening. 08/31/19   [provider]  memantine (NAMENDA) 10 MG tablet TAKE 1 TABLET(10 MG) BY MOUTH EVERY EVENING 02/26/22   [name], PMHNP  methocarbamol (ROBAXIN) 500 MG tablet Take 1 tablet (500 mg total) by mouth every 8 (eight) hours as needed for muscle spasms. Patient not taking: Reported on 01/21/2022 11/26/21   Ezequiel Essex, MD  Microlet Lancets MISC TEST DAILY AS DIRECTED 08/01/21   Elayne Snare, MD  nitrofurantoin, macrocrystal-monohydrate, (MACROBID) 100 MG capsule Take 1 capsule (100 mg total) by mouth 2 (two) times daily. 01/26/22   Truddie Hidden, MD  nitroGLYCERIN (NITROSTAT) 0.4 MG SL tablet Place 1 tablet (0.4 mg total) under the tongue every 5 (five) minutes as needed for chest pain. 09/14/17   Lyda Jester M, PA-C  ondansetron (ZOFRAN) 4 MG tablet Take 1 tablet (4 mg total) by mouth every 6 (six) hours. 09/20/21   Curatolo, Adam, DO  oxyCODONE (ROXICODONE) 5 MG immediate release tablet Take 1 tablet (5 mg total) by mouth every 6 (six)  hours as needed for up to 10 doses for breakthrough pain. Patient not taking: Reported on 01/21/2022 07/04/21   Lennice Sites, DO  oxyCODONE-acetaminophen (PERCOCET/ROXICET) 5-325 MG tablet Take 1 tablet by mouth every 6 (six) hours as needed for severe pain. Patient not taking: Reported on 01/21/2022 06/17/21   Shelly Coss, MD  PEG 3350-KCl-NaBcb-NaCl-NaSulf (PEG-3350/ELECTROLYTES) 236 g SOLR Take 17 g by mouth daily as needed. 10/06/21   [provider]  phenazopyridine (PYRIDIUM) 200 MG tablet Take 1 tablet (200 mg total) by mouth 3 (three) times daily. Patient not taking: Reported on 01/21/2022 12/12/21   Wynona Dove A, DO  QUEtiapine (SEROQUEL) 200 MG tablet TAKE 1 TAB PO q 5 pm and 1 tab po QHS 02/26/22   [name], PMHNP  rOPINIRole (REQUIP) 0.5 MG tablet Takes  4 tabs at 5 pm and 3 tabs at bedtime and 1-2 tabs prn 02/26/22   [name], PMHNP  vancomycin (VANCOCIN) 125 MG capsule Take 1 capsule (125 mg total) by mouth 2 (two) times daily. Patient not taking: Reported on 01/21/2022 11/12/21   Edwin Dada, MD  Wheat Dextrin (BENEFIBER PO) Take 1 Dose by mouth daily. Patient not taking: Reported on 01/21/2022    [provider]      Allergies    Codeine, Procaine hcl, Epinephrine, Aspirin, Augmentin [amoxicillin-pot clavulanate], Benadryl [diphenhydramine], Diflucan [fluconazole], Dilaudid [hydromorphone hcl], Hydromorphone, Morphine, Other, Prednisone, Tramadol hcl, and Sulfa antibiotics    Review of Systems   Review of Systems  Musculoskeletal:  Positive for back pain.    Physical Exam Updated Vital Signs BP (!) 158/88 (BP Location: Right Arm)   Pulse 98   Temp 97.9 F (36.6 C)   Resp (!) 22   SpO2 97%  Physical Exam  ED Results / Procedures / Treatments   Labs (all labs ordered are listed, but only abnormal results are displayed) Labs Reviewed - No data to display  EKG None  Radiology No results found.  Procedures Procedures   {Document cardiac monitor, telemetry assessment procedure when appropriate:1}  Medications Ordered in ED Medications  oxyCODONE-acetaminophen (PERCOCET/ROXICET) 5-325 MG per tablet 1 tablet (1 tablet Oral Given 03/31/22 1600)    ED Course/ Medical Decision Making/ A&P   {   Click here for ABCD2, HEART and other calculatorsREFRESH Note before signing :1}                          Medical Decision Making Amount and/or Complexity of Data Reviewed Labs: ordered. Radiology: ordered.  Risk Prescription drug management.   ***  {Document critical care time when appropriate:1} {Document review of labs and clinical decision tools ie heart score, Chads2Vasc2 etc:1}  {Document your independent review of radiology images, and any outside records:1} {Document your discussion with family members, caretakers, and with consultants:1} {Document social determinants of health affecting pt's care:1} {Document your decision making why or why not admission, treatments were needed:1} Final Clinical Impression(s) / ED Diagnoses Final diagnoses:  None    Rx / DC Orders ED Discharge Orders     None

## 2022-04-01 LAB — C-REACTIVE PROTEIN: CRP: <0.5 mg/dL (ref <1.0)

## 2022-04-03 DIAGNOSIS — M549 Dorsalgia, unspecified: Secondary | ICD-10-CM | POA: Diagnosis not present

## 2022-04-03 DIAGNOSIS — E1165 Type 2 diabetes mellitus with hyperglycemia: Secondary | ICD-10-CM | POA: Diagnosis not present

## 2022-04-03 DIAGNOSIS — N1330 Unspecified hydronephrosis: Secondary | ICD-10-CM | POA: Diagnosis not present

## 2022-04-04 DIAGNOSIS — N3001 Acute cystitis with hematuria: Secondary | ICD-10-CM | POA: Diagnosis not present

## 2022-04-04 DIAGNOSIS — R300 Dysuria: Secondary | ICD-10-CM | POA: Diagnosis not present

## 2022-04-06 DIAGNOSIS — M545 Low back pain, unspecified: Secondary | ICD-10-CM | POA: Diagnosis not present

## 2022-04-06 DIAGNOSIS — N3001 Acute cystitis with hematuria: Secondary | ICD-10-CM | POA: Diagnosis not present

## 2022-04-06 DIAGNOSIS — R109 Unspecified abdominal pain: Secondary | ICD-10-CM | POA: Diagnosis not present

## 2022-04-10 ENCOUNTER — Telehealth: Payer: Medicare Other | Admitting: Psychiatry

## 2022-04-10 ENCOUNTER — Ambulatory Visit (INDEPENDENT_AMBULATORY_CARE_PROVIDER_SITE_OTHER): Payer: Medicare Other | Admitting: Psychiatry

## 2022-04-10 ENCOUNTER — Encounter: Payer: Self-pay | Admitting: Psychiatry

## 2022-04-10 VITALS — Wt 194.0 lb

## 2022-04-10 DIAGNOSIS — F5101 Primary insomnia: Secondary | ICD-10-CM | POA: Diagnosis not present

## 2022-04-10 DIAGNOSIS — F319 Bipolar disorder, unspecified: Secondary | ICD-10-CM

## 2022-04-10 DIAGNOSIS — Z79899 Other long term (current) drug therapy: Secondary | ICD-10-CM | POA: Diagnosis not present

## 2022-04-10 DIAGNOSIS — F411 Generalized anxiety disorder: Secondary | ICD-10-CM | POA: Diagnosis not present

## 2022-04-10 MED ORDER — DONEPEZIL HCL 10 MG PO TABS: 10.0000 mg | ORAL_TABLET | Freq: Every day | ORAL | 0 refills | Status: DC
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
## 2022-04-13 ENCOUNTER — Encounter (INDEPENDENT_AMBULATORY_CARE_PROVIDER_SITE_OTHER): Payer: Self-pay | Admitting: Internal Medicine

## 2022-04-13 ENCOUNTER — Ambulatory Visit (INDEPENDENT_AMBULATORY_CARE_PROVIDER_SITE_OTHER): Payer: Medicare Other | Admitting: Internal Medicine

## 2022-04-13 ENCOUNTER — Telehealth: Payer: Self-pay

## 2022-04-13 VITALS — BP 146/82 | HR 83 | Temp 98.2°F | Ht 60.0 in

## 2022-04-13 DIAGNOSIS — F319 Bipolar disorder, unspecified: Secondary | ICD-10-CM | POA: Diagnosis not present

## 2022-04-13 DIAGNOSIS — Z6837 Body mass index (BMI) 37.0-37.9, adult: Secondary | ICD-10-CM

## 2022-04-13 DIAGNOSIS — E1169 Type 2 diabetes mellitus with other specified complication: Secondary | ICD-10-CM | POA: Diagnosis not present

## 2022-04-13 DIAGNOSIS — Z7984 Long term (current) use of oral hypoglycemic drugs: Secondary | ICD-10-CM | POA: Diagnosis not present

## 2022-04-13 DIAGNOSIS — I10 Essential (primary) hypertension: Secondary | ICD-10-CM | POA: Diagnosis not present

## 2022-04-13 DIAGNOSIS — E669 Obesity, unspecified: Secondary | ICD-10-CM

## 2022-04-13 DIAGNOSIS — Z0289 Encounter for other administrative examinations: Secondary | ICD-10-CM

## 2022-04-13 NOTE — Telephone Encounter (Signed)
Pt lvm wanting to know if it is okay if she takes Keto + ACV gummies

## 2022-04-13 NOTE — Telephone Encounter (Signed)
Slimsculpt Keto ACV Gummies

## 2022-04-14 DIAGNOSIS — Z79899 Other long term (current) drug therapy: Secondary | ICD-10-CM | POA: Diagnosis not present

## 2022-04-14 NOTE — Progress Notes (Signed)
Office: [phone]  /  Fax: [phone]   Initial Visit  [name] [name] was seen in clinic today to evaluate for obesity. She is interested in losing weight to improve overall health and reduce the risk of weight related complications. She presents today to review program treatment options, initial physical assessment, and evaluation.     She was referred by: Specialist psychiatrist  When asked what else they would like to accomplish? She states: Adopt healthier eating patterns, Improve energy levels and physical activity, Improve existing medical conditions, Reduce number of medications, and Improve quality of life  When asked how has your weight affected you? She states: Has affected self-esteem, Contributed to medical problems, Contributed to orthopedic problems or mobility issues, Having fatigue, Having poor endurance, and Problems with depression and or anxiety  Some associated conditions: Hypertension, Hyperlipidemia, and Diabetes  Contributing factors: Family history, Disruption of circadian rhythm, Nutritional, Medications, Reduced physical activity, Eating patterns, Mental health problems, and Menopause  Weight promoting medications identified: Psychotropic medications, Antiepileptics, and Anticholinergics  Current nutrition plan: None  Current level of physical activity: None  Current or previous pharmacotherapy: None  Response to medication: Never tried medications   Past medical history includes:   Past Medical History:  Diagnosis Date   Alcohol abuse    Anxiety    takes Valium daily as needed and Ativan daily   Bilateral hearing loss    Bipolar 1 disorder ([place])    takes Lithium nightly and Synthroid daily   Chronic back pain    DDD; "all over" (09/14/2017)   Colitis, ischemic ([place]) 2012   Confusion    r/t meds   Depression    takes Prozac daily and Bupspirone    Diverticulosis    Dyslipidemia    takes Crestor daily   Fibromyalgia    Gastroparesis     Headache    "weekly" (09/14/2017)   Hepatic steatosis 06/18/12   severe   Hyperlipidemia    Hypertension    Hypothyroidism    IBS (irritable bowel syndrome)    Ischemic colitis ([place])    Joint pain    Joint swelling    Lupus erythematosus tumidus    tumid-skin   Migraine    "1-2/yr; maybe" (09/14/2017)   Numbness    in right foot   Osteoarthritis    "all over" (09/14/2017)   Osteoarthritis cervical spine    Osteoarthritis of hand    bilateral   Pneumonia    "walking pneumonia several times; long time since the last time" (09/14/2017)   Restless leg syndrome    takes Requip nightly   Sciatica    Type II diabetes mellitus ([name])    Urinary frequency    Urinary leakage    Urinary urgency    Urinary, incontinence, stress female    Walking pneumonia    last time more than 44yr ago     Objective:   BP (!) 146/82   Pulse 83   Temp 98.2 F (36.8 C)   Ht 5' (1.524 m)   SpO2 94%   BMI 37.89 kg/m  She was weighed on the bioimpedance scale: Body mass index is 37.89 kg/m.  Peak Weight: 185, Body Fat%: 48, Visceral Fat Rating: 16, Weight trend over the last 12 months: Increasing  General:  Alert, oriented and cooperative. Patient is in no acute distress.  Respiratory: Normal respiratory effort, no problems with respiration noted  Extremities: Normal range of motion.    Mental Status: Normal mood and affect. Normal  behavior. Normal judgment and thought content.   DIAGNOSTIC DATA REVIEWED:  BMET    Component Value Date/Time   NA 139 03/31/2022 2135   NA 140 01/27/2021 1526   K 4.2 03/31/2022 2135   CL 103 03/31/2022 2135   CO2 26 03/31/2022 2135   GLUCOSE 137 (H) 03/31/2022 2135   BUN 14 03/31/2022 2135   BUN 8 01/27/2021 1526   CREATININE 0.88 03/31/2022 2135   CREATININE 0.85 10/09/2019 1052   CALCIUM 10.7 (H) 03/31/2022 2135   GFRNONAA >60 03/31/2022 2135   GFRNONAA 57 (L) 11/17/2017 0000   GFRAA >60 12/10/2019 0657   GFRAA 67 11/17/2017 0000   Lab Results   Component Value Date   HGBA1C 6.8 (H) 12/26/2021   HGBA1C 5.5 12/25/2010   No results found for: "INSULIN" CBC    Component Value Date/Time   WBC 7.8 03/31/2022 2135   RBC 4.96 03/31/2022 2135   HGB 14.5 03/31/2022 2135   HGB 14.0 01/27/2021 1526   HCT 45.9 03/31/2022 2135   HCT 41.5 01/27/2021 1526   PLT 256 03/31/2022 2135   PLT 255 01/27/2021 1526   MCV 92.5 03/31/2022 2135   MCV 87 01/27/2021 1526   MCH 29.2 03/31/2022 2135   MCHC 31.6 03/31/2022 2135   RDW 14.0 03/31/2022 2135   RDW 13.6 01/27/2021 1526   Iron/TIBC/Ferritin/ %Sat    Component Value Date/Time   FERRITIN 43 07/06/2015 0459   Lipid Panel     Component Value Date/Time   CHOL 168 12/26/2021 1004   TRIG 234.0 (H) 12/26/2021 1004   HDL 60.90 12/26/2021 1004   CHOLHDL 3 12/26/2021 1004   VLDL 46.8 (H) 12/26/2021 1004   LDLCALC 79 08/09/2020 1026   LDLDIRECT 84.0 12/26/2021 1004   Hepatic Function Panel     Component Value Date/Time   PROT 6.5 03/29/2022 0949   PROT 6.4 01/27/2021 1526   ALBUMIN 4.2 03/29/2022 0949   ALBUMIN 4.4 01/27/2021 1526   AST 14 (L) 03/29/2022 0949   ALT 14 03/29/2022 0949   ALKPHOS 73 03/29/2022 0949   BILITOT 0.5 03/29/2022 0949   BILITOT 0.4 01/27/2021 1526   BILIDIR 0.2 06/14/2021 0406   IBILI 0.5 06/14/2021 0406      Component Value Date/Time   TSH 0.67 10/06/2021 1110     Assessment and Plan:  1. Essential hypertension Blood pressure slightly above target.  Most recent renal parameters showed normal GFR.  She is currently on losartan 25 mg in the evening without any adverse effects.  Most recent renal parameters shows a normal GFR.  2. Diabetes mellitus type 2 in obese [facility]) Her most recent A1c is 6.8 we suggest adequate control for age and comorbid conditions.  She is currently on a basal bolus regimen which may contribute to weight gain.  She has had side effects to incretin mimetic's in the past as well as metformin.  She will benefit from a reduced  calorie healthy plan.  3. Bipolar 1 disorder, depressed ([place]) Stable.  Patient is followed closely by [name].  She is on several psychotropic medication.  Some of them may contribute to weight gain and may actually make weight loss a bit difficult for patient considering her age and low levels of physical activity.  4. Class 2 severe obesity with serious comorbidity and body mass index (BMI) of 37.0 to 37.9 in adult, unspecified obesity type ([place]) We reviewed weight, biometrics, associated medical conditions and contributing factors with patient. She would benefit from  weight loss therapy via a modified calorie, low-carb, high-protein nutritional plan tailored to their REE (resting energy expenditure) which will be determined by indirect calorimetry.  We will also assess for cardiometabolic risk and nutritional derangements via fasting serologies at her next appointment.       Obesity Treatment / Action Plan:  Patient will work on garnering support from family and friends to begin weight loss journey. Will work on eliminating or reducing the presence of highly palatable, calorie dense foods in the home. Will complete provided nutritional and psychosocial assessment questionnaire before the next appointment. Will be scheduled for indirect calorimetry to determine resting energy expenditure in a fasting state.  This will allow [place] to create a reduced calorie, high-protein meal plan to promote loss of fat mass while preserving muscle mass. Counseled on the health benefits of losing 5%-15% of total body weight. Was counseled on nutritional approaches to weight loss and benefits of complex carbs and high quality protein as part of nutritional weight management. Was counseled on pharmacotherapy and role as an adjunct in weight management.   Obesity Education Performed Today:  She was weighed on the bioimpedance scale and results were discussed and documented in the synopsis.  We discussed  obesity as a disease and the importance of a more detailed evaluation of all the factors contributing to the disease.  We discussed the importance of long term lifestyle changes which include nutrition, exercise and behavioral modifications as well as the importance of customizing this to her specific health and social needs.  We discussed the benefits of reaching a healthier weight to alleviate the symptoms of existing conditions and reduce the risks of the biomechanical, metabolic and psychological effects of obesity.  [name] appears to be in the action stage of change and states they are ready to start intensive lifestyle modifications and behavioral modifications.  30 minutes was spent today on this visit including the above counseling, pre-visit chart review, and post-visit documentation.  Reviewed by clinician on day of visit: allergies, medications, problem list, medical history, surgical history, family history, social history, and previous encounter notes.    I have reviewed the above documentation for accuracy and completeness, and I agree with the above.  Thomes Dinning, MD

## 2022-04-14 NOTE — Telephone Encounter (Signed)
Patient notified

## 2022-04-15 DIAGNOSIS — E785 Hyperlipidemia, unspecified: Secondary | ICD-10-CM | POA: Diagnosis not present

## 2022-04-15 DIAGNOSIS — E039 Hypothyroidism, unspecified: Secondary | ICD-10-CM | POA: Diagnosis not present

## 2022-04-15 DIAGNOSIS — K219 Gastro-esophageal reflux disease without esophagitis: Secondary | ICD-10-CM | POA: Diagnosis not present

## 2022-04-15 DIAGNOSIS — I10 Essential (primary) hypertension: Secondary | ICD-10-CM | POA: Diagnosis not present

## 2022-04-15 DIAGNOSIS — E1165 Type 2 diabetes mellitus with hyperglycemia: Secondary | ICD-10-CM | POA: Diagnosis not present

## 2022-04-15 LAB — VALPROIC ACID LEVEL: Valproic Acid Lvl: 61.9 mg/L (ref 50.0–100.0)

## 2022-04-15 LAB — LITHIUM LEVEL: Lithium Lvl: 0.6 mmol/L (ref 0.6–1.2)

## 2022-04-21 ENCOUNTER — Ambulatory Visit: Payer: Medicare Other | Admitting: Endocrinology

## 2022-04-21 ENCOUNTER — Other Ambulatory Visit: Payer: Self-pay

## 2022-04-21 DIAGNOSIS — Z794 Long term (current) use of insulin: Secondary | ICD-10-CM

## 2022-04-21 MED ORDER — LANTUS SOLOSTAR 100 UNIT/ML ~~LOC~~ SOPN: PEN_INJECTOR | SUBCUTANEOUS | 2 refills | Status: DC

## 2022-05-04 ENCOUNTER — Ambulatory Visit: Payer: Medicare Other | Admitting: Psychiatry

## 2022-05-06 ENCOUNTER — Ambulatory Visit (INDEPENDENT_AMBULATORY_CARE_PROVIDER_SITE_OTHER): Payer: Medicare Other | Admitting: Internal Medicine

## 2022-05-06 ENCOUNTER — Encounter (INDEPENDENT_AMBULATORY_CARE_PROVIDER_SITE_OTHER): Payer: Self-pay | Admitting: Internal Medicine

## 2022-05-06 VITALS — BP 138/79 | HR 94 | Temp 97.5°F | Ht 60.0 in | Wt 191.0 lb

## 2022-05-06 DIAGNOSIS — Z6837 Body mass index (BMI) 37.0-37.9, adult: Secondary | ICD-10-CM | POA: Diagnosis not present

## 2022-05-06 DIAGNOSIS — I10 Essential (primary) hypertension: Secondary | ICD-10-CM

## 2022-05-06 DIAGNOSIS — F319 Bipolar disorder, unspecified: Secondary | ICD-10-CM

## 2022-05-06 DIAGNOSIS — Z1331 Encounter for screening for depression: Secondary | ICD-10-CM | POA: Insufficient documentation

## 2022-05-06 DIAGNOSIS — R0602 Shortness of breath: Secondary | ICD-10-CM | POA: Diagnosis not present

## 2022-05-06 DIAGNOSIS — E669 Obesity, unspecified: Secondary | ICD-10-CM | POA: Diagnosis not present

## 2022-05-06 DIAGNOSIS — E1169 Type 2 diabetes mellitus with other specified complication: Secondary | ICD-10-CM

## 2022-05-06 DIAGNOSIS — R5383 Other fatigue: Secondary | ICD-10-CM | POA: Diagnosis not present

## 2022-05-06 DIAGNOSIS — E039 Hypothyroidism, unspecified: Secondary | ICD-10-CM | POA: Diagnosis not present

## 2022-05-06 NOTE — Progress Notes (Signed)
Patient had abdominal pain.  I thought a flu test which is pertinent to see if she is having some gastroenteritis from a viral illness.

## 2022-05-06 NOTE — Assessment & Plan Note (Signed)
Patient has fatigue and low levels of energy.  She is currently on thyroid replacement therapy.  We will check TSH today for adequacy.

## 2022-05-06 NOTE — Assessment & Plan Note (Signed)
Patient has polypharmacy with several medications that are promoting weight gain.  She will work with prescribing physicians on deintensification of several medications which are also anticholinergics and may be affecting her memory.  She is on Depakote, Seroquel, alprazolam.  She also takes gabapentin and oxycodone and is also on alprazolam which may compromise her respiratory status primarily at night.  She denies having a history of sleep apnea.  She is also on cognitive enhancers but denies having a history of dementia.  She states these were prescribed because she was having memory problems which may be related to some of the medications that she is on.  I think it may also be worthwhile repeating her sleep study or performing an overnight oxygen test.  She is on several CNS depressing drugs and is at risk for nighttime hypoxemia.

## 2022-05-06 NOTE — Assessment & Plan Note (Signed)
Blood pressure at goal for age and risk category.  On losartan 25 mg a day without adverse effects.  We are checking renal parameters today.  Patient is at risk for orthostasis and falls as she is on several medications known to cause orthostatic hypotension associated with hypertensives. Continue with weight loss therapy.  Monitor for symptoms of orthostasis while losing weight. Continue current regimen and home monitoring for a goal blood pressure of 120/80.

## 2022-05-06 NOTE — Progress Notes (Unsigned)
Chief Complaint:   OBESITY [name] [name] FAILS (MR# [id number]) is a 77 y.o. female who presents for evaluation and treatment of obesity and related comorbidities. Current BMI is Body mass index is 37.3 kg/m. [name] [name] has been struggling with her weight for many years and has been unsuccessful in either losing weight, maintaining weight loss, or reaching her healthy weight goal.  [name] [name] is currently in the action stage of change and ready to dedicate time achieving and maintaining a healthier weight. [name] [name] is interested in becoming our patient and working on intensive lifestyle modifications including (but not limited to) diet and exercise for weight loss.  [name] [name]'s habits were reviewed today and are as follows: Her family eats meals together, she struggles with family and or coworkers weight loss sabotage, her desired weight loss is 41 lbs, she has been heavy most of her life, she started gaining weight with bipolar meds, her heaviest weight ever was 194 pounds, she is a picky eater and doesn't like to eat healthier foods, she has significant food cravings issues, she wakes up frequently in the middle of the night to eat, she is frequently drinking liquids with calories, she frequently makes poor food choices, she has problems with excessive hunger, she frequently eats larger portions than normal, she has binge eating behaviors, and she struggles with emotional eating.  Depression Screen Freddy's Food and Mood (modified PHQ-9) score was 29.  Subjective:   1. Other fatigue [name] [name] admits to daytime somnolence and admits to waking up still tired. Patient has a history of symptoms of daytime fatigue, morning fatigue, and morning headache. [name] [name] generally gets 4 or 5 hours of sleep per night, and states that she has poor sleep quality. Snoring is present. Apneic episodes are not present. Epworth Sleepiness Score is 18.   2. SOB (shortness of breath) on exertion [name] [name] notes  increasing shortness of breath with exercising and seems to be worsening over time with weight gain. She notes getting out of breath sooner with activity than she used to. This has gotten worse recently. [name] [name] denies shortness of breath at rest or orthopnea.  3. Diabetes mellitus type 2 in obese ([name]) HgbA1c is at goal for age and comorbid conditions. Denies symptoms of hypoglycemia or hyperglycemia. On basal bolus regimen with good adherence and no side effects.  4. Bipolar 1 disorder, depressed ([name]) Patient has polypharmacy with several medications that are promoting weight gain.  She will work with prescribing physicians on deintensification of several medications which are also anticholinergics and may be affecting her memory.  She is on Depakote, Seroquel, alprazolam.  She also takes gabapentin and oxycodone and is also on alprazolam which may compromise her respiratory status primarily at night.  She denies having a history of sleep apnea.  She is also on cognitive enhancers but denies having a history of dementia.  She states these were prescribed because she was having memory problems which may be related to some of the medications that she is on.   5. Hypothyroidism, unspecified type Patient has fatigue and low levels of energy.  She is currently on thyroid replacement therapy.   6. Essential hypertension Blood pressure at goal for age and risk category.  On losartan 25 mg a day without adverse effects.    Assessment/Plan:   1. Other fatigue [name] [name] does feel that her weight is causing her energy to be lower than it should be. Fatigue may be related to obesity, depression or many other causes. Labs will be  ordered, and in the meanwhile, [name] will focus on self care including making healthy food choices, increasing physical activity and focusing on stress reduction.  2. SOB (shortness of breath) on exertion [name] [name] does feel that she gets out of breath more easily that she used  to when she exercises. [name] [name]'s shortness of breath appears to be obesity related and exercise induced. She has agreed to work on weight loss and gradually increase exercise to treat her exercise induced shortness of breath. Will continue to monitor closely.  3. Diabetes mellitus type 2 in obese [name]) I counseled patient extensively regarding monitoring for hypoglycemia as she will be implementing nutritional changes which will reduce high glycemic foods.  This may require adjustments to her insulin regimen.  We will likely start off with rapid acting insulin and then reduce her basal.  This will also assist patient with weight loss.  Unfortunately she had side effects to metformin and GLP-1 drugs in the past.  I have to review her records.  Counseled on goals of care, monitoring for complications and importance of staying updated on immunizations and diabetes preventive measures.   Lab/Orders: - Vitamin B12 - Hemoglobin A1c - Lipid Panel With LDL/HDL Ratio  4. Bipolar 1 disorder, depressed ([name] [name]) I think it may also be worthwhile repeating her sleep study or performing an overnight oxygen test.  She is on several CNS depressing drugs and is at risk for nighttime hypoxemia.  5. Hypothyroidism, unspecified type We will check TSH today for adequacy.   Lab/Orders: - TSH  6. Essential hypertension We are checking renal parameters today.  Patient is at risk for orthostasis and falls as she is on several medications known to cause orthostatic hypotension associated with hypertensives. Continue with weight loss therapy.  Monitor for symptoms of orthostasis while losing weight. Continue current regimen and home monitoring for a goal blood pressure of 120/80.  7. Depression screen [name] [name] had a positive depression screening. Depression is commonly associated with obesity and often results in emotional eating behaviors. We will monitor this closely and work on CBT to help improve the non-hunger  eating patterns. Referral to Psychology may be required if no improvement is seen as she continues in our clinic.  8. Class 2 severe obesity with serious comorbidity and body mass index (BMI) of 37.0 to 37.9 in adult, unspecified obesity type ([place]) Lab/Orders: - VITAMIN D 25 Hydroxy (Vit-D Deficiency, Fractures)  D[place] is currently in the action stage of change and her goal is to continue with weight loss efforts. I recommend Maven begin the structured treatment plan as follows:  ***no plan circled on paper chart*** She has agreed to {MWMwtlossportion/plan2:23431}.  Exercise goals: All adults should avoid inactivity. Some physical activity is better than none, and adults who participate in any amount of physical activity gain some health benefits.   Behavioral modification strategies: increasing lean protein intake, decreasing simple carbohydrates, increasing vegetables, and increasing water intake.  She was informed of the importance of frequent follow-up visits to maximize her success with intensive lifestyle modifications for her multiple health conditions. She was informed we would discuss her lab results at her next visit unless there is a critical issue that needs to be addressed sooner. Saylee agreed to keep her next visit at the agreed upon time to discuss these results.  Objective:   Blood pressure 138/79, pulse 94, temperature (!) 97.5 F (36.4 C), height 5' (1.524 m), weight 191 lb (86.6 kg), SpO2 94 %. Body mass index is 37.3 kg/m.  EKG: Normal sinus rhythm, rate 72 (03/29/2022).  Indirect Calorimeter completed today shows a VO2 of 80 and a REE of 547.  Her calculated basal metabolic rate is 0000000 thus her basal metabolic rate is worse than expected. (Pt had a difficult time)  General: Cooperative, alert, well developed, in no acute distress. HEENT: Conjunctivae and lids unremarkable. Cardiovascular: Regular rhythm.  Lungs: Normal work of breathing. Neurologic: No  focal deficits.   Lab Results  Component Value Date   CREATININE 0.88 03/31/2022   BUN 14 03/31/2022   NA 139 03/31/2022   K 4.2 03/31/2022   CL 103 03/31/2022   CO2 26 03/31/2022   Lab Results  Component Value Date   ALT 14 03/29/2022   AST 14 (L) 03/29/2022   ALKPHOS 73 03/29/2022   BILITOT 0.5 03/29/2022   Lab Results  Component Value Date   HGBA1C 7.2 (H) 05/06/2022   HGBA1C 6.8 (H) 12/26/2021   HGBA1C 6.8 (H) 10/06/2021   HGBA1C 6.7 (A) 07/16/2021   HGBA1C 7.7 (H) 03/25/2021   No results found for: "INSULIN" Lab Results  Component Value Date   TSH 0.790 05/06/2022   Lab Results  Component Value Date   CHOL 159 05/06/2022   HDL 58 05/06/2022   LDLCALC 72 05/06/2022   LDLDIRECT 84.0 12/26/2021   TRIG 175 (H) 05/06/2022   CHOLHDL 3 12/26/2021   Lab Results  Component Value Date   WBC 7.8 03/31/2022   HGB 14.5 03/31/2022   HCT 45.9 03/31/2022   MCV 92.5 03/31/2022   PLT 256 03/31/2022   Lab Results  Component Value Date   FERRITIN 43 07/06/2015     Attestation Statements:   Reviewed by clinician on day of visit: allergies, medications, problem list, medical history, surgical history, family history, social history, and previous encounter notes.  I, Kathlene November, BS, CMA, am acting as transcriptionist for Thomes Dinning, MD.  I have reviewed the above documentation for accuracy and completeness, and I agree with the above. - ***

## 2022-05-06 NOTE — Assessment & Plan Note (Signed)
HgbA1c is at goal for age and comorbid conditions. Denies symptoms of hypoglycemia or hyperglycemia. On basal bolus regimen with good adherence and no side effects.  I counseled patient extensively regarding monitoring for hypoglycemia as she will be implementing nutritional changes which will reduce high glycemic foods.  This may require adjustments to her insulin regimen.  We will likely start off with rapid acting insulin and then reduce her basal.  This will also assist patient with weight loss.  Unfortunately she had side effects to metformin and GLP-1 drugs in the past.  I have to review her records.  Counseled on goals of care, monitoring for complications and importance of staying updated on immunizations and diabetes preventive measures.  Lab Results  Component Value Date   HGBA1C 6.8 (H) 12/26/2021   HGBA1C 6.8 (H) 10/06/2021   HGBA1C 6.7 (A) 07/16/2021   Lab Results  Component Value Date   MICROALBUR <0.7 12/26/2021   LDLCALC 79 08/09/2020   CREATININE 0.88 03/31/2022

## 2022-05-07 LAB — VITAMIN D 25 HYDROXY (VIT D DEFICIENCY, FRACTURES): Vit D, 25-Hydroxy: 37.1 ng/mL (ref 30.0–100.0)

## 2022-05-07 LAB — VITAMIN B12: Vitamin B-12: 712 pg/mL (ref 232–1245)

## 2022-05-07 LAB — LIPID PANEL WITH LDL/HDL RATIO
Cholesterol, Total: 159 mg/dL (ref 100–199)
HDL: 58 mg/dL (ref >39)
LDL Chol Calc (NIH): 72 mg/dL (ref 0–99)
LDL/HDL Ratio: 1.2 ratio (ref 0.0–3.2)
Triglycerides: 175 mg/dL — ABNORMAL HIGH (ref 0–149)
VLDL Cholesterol Cal: 29 mg/dL (ref 5–40)

## 2022-05-07 LAB — TSH: TSH: 0.79 u[IU]/mL (ref 0.450–4.500)

## 2022-05-07 LAB — HEMOGLOBIN A1C
Est. average glucose Bld gHb Est-mCnc: 160 mg/dL
Hgb A1c MFr Bld: 7.2 % — ABNORMAL HIGH (ref 4.8–5.6)

## 2022-05-11 DIAGNOSIS — M47817 Spondylosis without myelopathy or radiculopathy, lumbosacral region: Secondary | ICD-10-CM | POA: Diagnosis not present

## 2022-05-11 DIAGNOSIS — M5451 Vertebrogenic low back pain: Secondary | ICD-10-CM | POA: Diagnosis not present

## 2022-05-11 DIAGNOSIS — G8929 Other chronic pain: Secondary | ICD-10-CM | POA: Diagnosis not present

## 2022-05-11 DIAGNOSIS — M41126 Adolescent idiopathic scoliosis, lumbar region: Secondary | ICD-10-CM | POA: Diagnosis not present

## 2022-05-11 DIAGNOSIS — M5136 Other intervertebral disc degeneration, lumbar region: Secondary | ICD-10-CM | POA: Diagnosis not present

## 2022-05-11 DIAGNOSIS — M545 Low back pain, unspecified: Secondary | ICD-10-CM | POA: Diagnosis not present

## 2022-05-12 ENCOUNTER — Other Ambulatory Visit: Payer: Self-pay

## 2022-05-12 ENCOUNTER — Telehealth: Payer: Self-pay | Admitting: Psychiatry

## 2022-05-12 DIAGNOSIS — Z79899 Other long term (current) drug therapy: Secondary | ICD-10-CM

## 2022-05-12 NOTE — Telephone Encounter (Signed)
Please see message. Directions for use - 75 mg, 1 tab BID x30 days.

## 2022-05-12 NOTE — Telephone Encounter (Signed)
Pt called at 9:05a.  She said another doctor wants her to take Diclofenac Sodium.  She wants to know from Janett Billow if it's ok to take it with the meds she already takes.  Next appt 3/11

## 2022-05-12 NOTE — Telephone Encounter (Signed)
Please let her know that it can potentially raise Lithium levels like other anti-inflammatory meds she has taken in the past since it is also cleared renally. Recommend re-checking Lithium level and creatinine levels 2 weeks after starting diclofenac. Will send in lab order to [facility]. Please advise her to contact office if she experiences signs of Lithium toxicity (slurred speech, worsening tremor, unsteadiness, n/v, diarrhea, etc)

## 2022-05-12 NOTE — Telephone Encounter (Signed)
LVM to [facility] 

## 2022-05-12 NOTE — Telephone Encounter (Signed)
Patient notified of recommendations. 

## 2022-05-18 ENCOUNTER — Ambulatory Visit (INDEPENDENT_AMBULATORY_CARE_PROVIDER_SITE_OTHER): Payer: Medicare Other | Admitting: Psychiatry

## 2022-05-18 ENCOUNTER — Encounter: Payer: Self-pay | Admitting: Psychiatry

## 2022-05-18 DIAGNOSIS — F5101 Primary insomnia: Secondary | ICD-10-CM

## 2022-05-18 DIAGNOSIS — F319 Bipolar disorder, unspecified: Secondary | ICD-10-CM

## 2022-05-18 DIAGNOSIS — G2581 Restless legs syndrome: Secondary | ICD-10-CM | POA: Diagnosis not present

## 2022-05-18 MED ORDER — ROPINIROLE HCL 0.5 MG PO TABS: ORAL_TABLET | ORAL | 0 refills | Status: DC

## 2022-05-18 MED ORDER — DONEPEZIL HCL 10 MG PO TABS: ORAL_TABLET | ORAL | 0 refills | Status: DC

## 2022-05-18 MED ORDER — MEMANTINE HCL 10 MG PO TABS: ORAL_TABLET | ORAL | 0 refills | Status: DC

## 2022-05-18 MED ORDER — QUETIAPINE FUMARATE 200 MG PO TABS: ORAL_TABLET | ORAL | 0 refills | Status: DC
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
## 2022-05-20 ENCOUNTER — Encounter (INDEPENDENT_AMBULATORY_CARE_PROVIDER_SITE_OTHER): Payer: Self-pay | Admitting: Internal Medicine

## 2022-05-20 ENCOUNTER — Ambulatory Visit (INDEPENDENT_AMBULATORY_CARE_PROVIDER_SITE_OTHER): Payer: Medicare Other | Admitting: Internal Medicine

## 2022-05-20 VITALS — BP 130/82 | HR 104 | Temp 98.1°F | Ht 60.0 in | Wt 185.0 lb

## 2022-05-20 DIAGNOSIS — Z6837 Body mass index (BMI) 37.0-37.9, adult: Secondary | ICD-10-CM

## 2022-05-20 DIAGNOSIS — E669 Obesity, unspecified: Secondary | ICD-10-CM

## 2022-05-20 DIAGNOSIS — Z794 Long term (current) use of insulin: Secondary | ICD-10-CM | POA: Diagnosis not present

## 2022-05-20 DIAGNOSIS — I10 Essential (primary) hypertension: Secondary | ICD-10-CM

## 2022-05-20 DIAGNOSIS — E1169 Type 2 diabetes mellitus with other specified complication: Secondary | ICD-10-CM | POA: Diagnosis not present

## 2022-05-20 NOTE — Assessment & Plan Note (Signed)
Blood pressure at goal for age and risk category.  On losartan 25 mg a day without adverse effects.  Her most recent renal parameters show normal GFR and electrolytes.  She is now off one of her cognitive enhancers which will reduce the risk of falls.  She will monitor for symptoms of orthostasis while losing weight. Continue current regimen and home monitoring for a goal blood pressure of 120/80.

## 2022-05-20 NOTE — Assessment & Plan Note (Signed)
Her most recent A1c is now up to 7.2.  I reviewed her CGM data and on average her blood sugars are in the 160s to 170s.  She has had no hypoglycemic spells.  She had stopped her Humalog insulin because of concerns for hypoglycemia.  She has been only been taking Lantus.  I advised patient to reduce her fast acting insulin but to administer 6 units before her meals.  She will be monitoring closely for hypoglycemia as she has significantly reduced her carbs.  Her psychotropic medications are also being titrated down which would help.

## 2022-05-20 NOTE — Progress Notes (Signed)
Office: [phone]  /  Fax: [phone]  WEIGHT SUMMARY AND BIOMETRICS  Vitals Temp: 98.1 F (36.7 C) BP: 130/82 Pulse Rate: (!) 104 SpO2: 93 %   Anthropometric Measurements Height: 5' (1.524 m) Weight: 185 lb (83.9 kg) BMI (Calculated): 36.13 Weight at Last Visit: 191 lb Weight Lost Since Last Visit: 6 llb Starting Weight: 191 lb Total Weight Loss (lbs): 6 lb (2.722 kg) Peak Weight: 194 lb   Body Composition  Body Fat %: 46.1 % Fat Mass (lbs): 85.4 lbs Muscle Mass (lbs): 95 lbs Total Body Water (lbs): 68.8 lbs Visceral Fat Rating : 15    HPI  Chief Complaint: OBESITY  Mande is here to discuss her progress with her obesity treatment plan. She is on the the Category 2 Plan and states she is following her eating plan approximately 100 % of the time. She states she is not exercising, but is tying to walk more.  Interval History:  Since last office visit she has lost 6lbs. She reports good adherence to reduced calorie nutritional plan. She has been working on eating power bowls for lunch, tuna pouches and also has been eating eggs and [place] yogurt.  She inquires about protein shakes.  She has been tracking calories on paper and inquires about using an app.  She has a Stage manager.  Upon review of her paper her know it appears that she is staying under 1000 cal and at the most probably getting about 60 g of protein.  Her BIA showed a reduction in body fat and maintenance of muscle mass. '[x]'$ Denies '[]'$ Reports problems with appetite and hunger signals.  '[x]'$ Denies '[]'$ Reports problems with satiety and satiation.  '[x]'$ Denies '[]'$ Reports problems with eating patterns and portion control.  '[x]'$ Denies '[]'$ Reports abnormal cravings Sleeping approximately 7 hours a day.  Sleep described as: '[]'$ Restorative '[x]'$ Unrestorative '[]'$ Interrupted Stress levels are reported as low and manageable.  Barriers identified orthopedic problems or chronic pain affecting mobility.  Patient also has been  skipping Humalog injections because she is seeing significant drops in her blood sugar.  Review of her blood sugar log reveals an average of 176 and no lows.  She has been also working with her psychiatrist and they are working together on reducing some of her psychotropic medication.  This will help with her weight.    Pharmacotherapy for weight loss: She is currently taking no anti-obesity medication.   Weight promoting medications identified: Psychotropic medications and Antiepileptics.  ASSESSMENT AND PLAN  TREATMENT PLAN FOR OBESITY:  Recommended Dietary Goals  Bilen is currently in the action stage of change. As such, her goal is to continue weight management plan. She has agreed to: the Category 2 Plan.  Behavioral Intervention  We discussed the following Behavioral Modification Strategies today: increasing lean protein intake, increasing vegetables, avoiding skipping meals, increasing water intake, work on meal planning and easy cooking plans, work on tracking and journaling calories using tracking App, and reading food labels .  I also provided her with a calorie target of 1100 cal and 80 g of protein.  She will work with her daughter to download tracking apps I will help her set it up she is very eager and interested in doing this.  I explained to her that as long as she is following the plan she should be hitting her calorie and protein target.  Additional resources provided today: Handout on tracking and journaling using Apps and Handout on protein drinks and shakes  Recommended Physical Activity Goals  Noon has been  advised to work up to 150 minutes of moderate intensity aerobic activity a week and strengthening exercises 2-3 times per week for cardiovascular health, weight loss maintenance and preservation of muscle mass.   She has agreed to :  '[]'$  Continue current level of physical activity  '[x]'$  Think about ways to increase physical activity '[]'$  Start strengthening  exercises with a goal of 2-3 sessions a week  '[]'$  Start aerobic activity with a goal of 150 minutes a week at moderate intensity.  '[x]'$  Increase the intensity, frequency or duration of strengthening exercises  '[]'$  Increase the intensity, frequency or duration of aerobic exercises   '[]'$  Increase physical activity in their day and reduce sedentary time (increase NEAT). '[]'$  Work on scheduling and tracking physical activity.   Pharmacotherapy We discussed various medication options to help [name] with her weight loss efforts and we both agreed to : continue with nutritional and behavioral strategies  ASSOCIATED CONDITIONS ADDRESSED TODAY  Diabetes mellitus type 2 in obese [facility]) Assessment & Plan: Her most recent A1c is now up to 7.2.  I reviewed her CGM data and on average her blood sugars are in the 160s to 170s.  She has had no hypoglycemic spells.  She had stopped her Humalog insulin because of concerns for hypoglycemia.  She has been only been taking Lantus.  I advised patient to reduce her fast acting insulin but to administer 6 units before her meals.  She will be monitoring closely for hypoglycemia as she has significantly reduced her carbs.  Her psychotropic medications are also being titrated down which would help.   Essential hypertension Assessment & Plan: Blood pressure at goal for age and risk category.  On losartan 25 mg a day without adverse effects.  Her most recent renal parameters show normal GFR and electrolytes.  She is now off one of her cognitive enhancers which will reduce the risk of falls.  She will monitor for symptoms of orthostasis while losing weight. Continue current regimen and home monitoring for a goal blood pressure of 120/80.    Obesity with current BMI of 37     PHYSICAL EXAM:  Blood pressure 130/82, pulse (!) 104, temperature 98.1 F (36.7 C), height 5' (1.524 m), weight 185 lb (83.9 kg), SpO2 93 %. Body mass index is 36.13 kg/m.  General: She is  overweight, cooperative, alert, well developed, and in no acute distress. PSYCH: Has normal mood, affect and thought process.   HEENT: EOMI, sclerae are anicteric. Lungs: Normal breathing effort, no conversational dyspnea. Extremities: No edema.  Neurologic: No gross sensory or motor deficits. No tremors or fasciculations noted.    DIAGNOSTIC DATA REVIEWED:  BMET    Component Value Date/Time   NA 139 03/31/2022 2135   NA 140 01/27/2021 1526   K 4.2 03/31/2022 2135   CL 103 03/31/2022 2135   CO2 26 03/31/2022 2135   GLUCOSE 137 (H) 03/31/2022 2135   BUN 14 03/31/2022 2135   BUN 8 01/27/2021 1526   CREATININE 0.88 03/31/2022 2135   CREATININE 0.85 10/09/2019 1052   CALCIUM 10.7 (H) 03/31/2022 2135   GFRNONAA >60 03/31/2022 2135   GFRNONAA 57 (L) 11/17/2017 0000   GFRAA >60 12/10/2019 0657   GFRAA 67 11/17/2017 0000   Lab Results  Component Value Date   HGBA1C 7.2 (H) 05/06/2022   HGBA1C 5.5 12/25/2010   No results found for: "INSULIN" Lab Results  Component Value Date   TSH 0.790 05/06/2022   CBC    Component  Value Date/Time   WBC 7.8 03/31/2022 2135   RBC 4.96 03/31/2022 2135   HGB 14.5 03/31/2022 2135   HGB 14.0 01/27/2021 1526   HCT 45.9 03/31/2022 2135   HCT 41.5 01/27/2021 1526   PLT 256 03/31/2022 2135   PLT 255 01/27/2021 1526   MCV 92.5 03/31/2022 2135   MCV 87 01/27/2021 1526   MCH 29.2 03/31/2022 2135   MCHC 31.6 03/31/2022 2135   RDW 14.0 03/31/2022 2135   RDW 13.6 01/27/2021 1526   Iron Studies    Component Value Date/Time   FERRITIN 43 07/06/2015 0459   Lipid Panel     Component Value Date/Time   CHOL 159 05/06/2022 0948   TRIG 175 (H) 05/06/2022 0948   HDL 58 05/06/2022 0948   CHOLHDL 3 12/26/2021 1004   VLDL 46.8 (H) 12/26/2021 1004   LDLCALC 72 05/06/2022 0948   LDLDIRECT 84.0 12/26/2021 1004   Hepatic Function Panel     Component Value Date/Time   PROT 6.5 03/29/2022 0949   PROT 6.4 01/27/2021 1526   ALBUMIN 4.2 03/29/2022  0949   ALBUMIN 4.4 01/27/2021 1526   AST 14 (L) 03/29/2022 0949   ALT 14 03/29/2022 0949   ALKPHOS 73 03/29/2022 0949   BILITOT 0.5 03/29/2022 0949   BILITOT 0.4 01/27/2021 1526   BILIDIR 0.2 06/14/2021 0406   IBILI 0.5 06/14/2021 0406      Component Value Date/Time   TSH 0.790 05/06/2022 0948   Nutritional Lab Results  Component Value Date   VD25OH 37.1 05/06/2022   VD25OH 21 (L) 07/04/2012     Return in about 3 weeks (around 06/10/2022) for For Weight Mangement with Dr. Gerarda Fraction 40 minutes.. She was informed of the importance of frequent follow up visits to maximize her success with intensive lifestyle modifications for her multiple health conditions.   ATTESTASTION STATEMENTS:  Reviewed by clinician on day of visit: allergies, medications, problem list, medical history, surgical history, family history, social history, and previous encounter notes.   Time spent on visit including pre-visit chart review and post-visit care and charting was 40 minutes.    Thomes Dinning, MD

## 2022-05-21 ENCOUNTER — Other Ambulatory Visit: Payer: Self-pay

## 2022-05-21 ENCOUNTER — Encounter (HOSPITAL_BASED_OUTPATIENT_CLINIC_OR_DEPARTMENT_OTHER): Payer: Self-pay | Admitting: Emergency Medicine

## 2022-05-21 ENCOUNTER — Emergency Department (HOSPITAL_BASED_OUTPATIENT_CLINIC_OR_DEPARTMENT_OTHER): Payer: Medicare Other | Admitting: Radiology

## 2022-05-21 ENCOUNTER — Emergency Department (HOSPITAL_BASED_OUTPATIENT_CLINIC_OR_DEPARTMENT_OTHER)
Admission: EM | Admit: 2022-05-21 | Discharge: 2022-05-21 | Disposition: A | Discharge status: Home / Self Care | Payer: Medicare Other | Attending: Emergency Medicine | Admitting: Emergency Medicine

## 2022-05-21 DIAGNOSIS — R400 Somnolence: Secondary | ICD-10-CM | POA: Insufficient documentation

## 2022-05-21 DIAGNOSIS — G8929 Other chronic pain: Secondary | ICD-10-CM

## 2022-05-21 DIAGNOSIS — R102 Pelvic and perineal pain: Secondary | ICD-10-CM | POA: Diagnosis not present

## 2022-05-21 DIAGNOSIS — Z794 Long term (current) use of insulin: Secondary | ICD-10-CM | POA: Diagnosis not present

## 2022-05-21 DIAGNOSIS — M545 Low back pain, unspecified: Secondary | ICD-10-CM | POA: Diagnosis not present

## 2022-05-21 DIAGNOSIS — M4126 Other idiopathic scoliosis, lumbar region: Secondary | ICD-10-CM | POA: Diagnosis not present

## 2022-05-21 DIAGNOSIS — M4316 Spondylolisthesis, lumbar region: Secondary | ICD-10-CM | POA: Diagnosis not present

## 2022-05-21 DIAGNOSIS — M5442 Lumbago with sciatica, left side: Secondary | ICD-10-CM | POA: Diagnosis not present

## 2022-05-21 DIAGNOSIS — Z79899 Other long term (current) drug therapy: Secondary | ICD-10-CM | POA: Diagnosis not present

## 2022-05-21 DIAGNOSIS — M419 Scoliosis, unspecified: Secondary | ICD-10-CM

## 2022-05-21 LAB — URINALYSIS, ROUTINE W REFLEX MICROSCOPIC
Bilirubin Urine: NEGATIVE
Glucose, UA: NEGATIVE mg/dL
Hgb urine dipstick: NEGATIVE
Ketones, ur: NEGATIVE mg/dL
Leukocytes,Ua: NEGATIVE
Nitrite: NEGATIVE
Protein, ur: NEGATIVE mg/dL
Specific Gravity, Urine: 1.012 (ref 1.005–1.030)
pH: 6 (ref 5.0–8.0)

## 2022-05-21 MED ORDER — DICLOFENAC SODIUM 1 % EX GEL: 4.0000 g | Freq: Four times a day (QID) | CUTANEOUS | 0 refills | Status: DC

## 2022-05-21 MED ORDER — KETOROLAC TROMETHAMINE 60 MG/2ML IM SOLN
30.0000 mg | Freq: Once | INTRAMUSCULAR | Status: AC
  Administered 2022-05-21: 30 mg via INTRAMUSCULAR
  Filled 2022-05-21: qty 2

## 2022-05-21 MED ORDER — DEXAMETHASONE SODIUM PHOSPHATE 4 MG/ML IJ SOLN
4.0000 mg | Freq: Once | INTRAMUSCULAR | Status: AC
  Administered 2022-05-21: 4 mg via INTRAMUSCULAR
  Filled 2022-05-21: qty 1

## 2022-05-21 NOTE — ED Triage Notes (Addendum)
Patient c/o lower back pain radiating down her left leg that started last night.  Patient reports history of sciatic pain.  Patient also endorses left ankle and foot swelling.  Patient denies falls.

## 2022-05-21 NOTE — ED Notes (Signed)
Patient transported to X-ray 

## 2022-05-21 NOTE — ED Notes (Signed)
RN reviewed discharge instructions with pt. Pt verbalized understanding and had no further questions. VSS upon discharge.  

## 2022-05-21 NOTE — ED Provider Notes (Signed)
[place] Provider Note   CSN: [id number] Arrival date & time: 05/21/22  0442     History  Chief Complaint  Patient presents with   Back Pain    [name] [name] is a 77 y.o. female.  The history is provided by the patient.  Back Pain Location:  Gluteal region Radiates to:  L thigh Pain severity:  Severe Pain is:  Same all the time Timing:  Constant Progression:  Unchanged Chronicity:  Chronic Context: not emotional stress, not MCA and not MVA   Relieved by:  Nothing Worsened by:  Nothing Ineffective treatments:  None tried Associated symptoms: no abdominal pain and no fever   Risk factors: no hx of cancer   Patient with chronic back pain and fibromyalgia presents with low back pain radiating down the left leg not responsive to home medication.  No weakness no numbness.       Home Medications Prior to Admission medications   Medication Sig Start Date End Date Taking? Authorizing Provider  diclofenac Sodium (VOLTAREN) 1 % GEL Apply 4 g topically 4 (four) times daily. 05/21/22  Yes Arieana Somoza, MD  acetaminophen (TYLENOL) 325 MG tablet Take 2 tablets (650 mg total) by mouth every 6 (six) hours as needed. 02/11/21   Jeanell Sparrow, DO  ALPRAZolam Duanne Moron) 0.5 MG tablet Take 1 tablet (0.5 mg total) by mouth 4 (four) times daily as needed for anxiety. 02/18/22   [name], PMHNP  atorvastatin (LIPITOR) 40 MG tablet Take 40 mg by mouth every evening.    [provider]  b complex vitamins capsule Take 1 capsule by mouth daily.    [provider]  BD PEN NEEDLE NANO 2ND GEN 32G X 4 MM MISC USE TO INJECT 5 TIMES DAILY AS DIRECTED 03/16/22   Elayne Snare, MD  benzonatate (TESSALON) 100 MG capsule Take by mouth. 03/07/22   [provider]  bismuth subsalicylate (PEPTO BISMOL) 262 MG/15ML suspension Take 30 mLs by mouth every 6 (six) hours as needed.    [provider]  calcium carbonate (TUMS  EX) 750 MG chewable tablet Chew 1 tablet by mouth daily.    [provider]  cholecalciferol (VITAMIN D3) 25 MCG (1000 UNIT) tablet Take 3,000 Units by mouth daily. Also takes 2,000 units capsule at bedtime    [provider]  CONTOUR NEXT TEST test strip USE TO TEST BLOOD SUGAR TWICE DAILY 08/01/21   Elayne Snare, MD  cyclobenzaprine (FLEXERIL) 5 MG tablet Take 5 mg by mouth 3 (three) times daily as needed for muscle spasms.    [provider]  diclofenac (VOLTAREN) 75 MG EC tablet 1 tablet Orally Twice a day for 30 days 05/11/22   [provider]  dicyclomine (BENTYL) 20 MG tablet Take 1 tablet (20 mg total) by mouth 3 (three) times daily as needed for spasms. 06/17/21   Shelly Coss, MD  diphenhydrAMINE-PE-APAP [facility] SEVERE COLD NIGHTTIME PO) Take by mouth.    [provider]  divalproex (DEPAKOTE) 250 MG DR tablet Take 1 tablet (250 mg total) by mouth 2 (two) times daily AND 3 tablets (750 mg total) at bedtime. Patient not taking: Reported on 05/06/2022 03/16/22 05/15/22  [name], PMHNP  donepezil (ARICEPT) 10 MG tablet Take 1/2 tablet daily 05/18/22   [name], PMHNP  ezetimibe (ZETIA) 10 MG tablet Take 1 tablet (10 mg total) by mouth daily. 10/07/21   Elayne Snare, MD  fluticasone Asencion Islam) 50 MCG/ACT nasal  spray Place into both nostrils as needed for allergies or rhinitis.    [provider]  gabapentin (NEURONTIN) 600 MG tablet Take 600 mg by mouth 3 (three) times daily. 05/12/19   [provider]  ibuprofen (ADVIL) 800 MG tablet Take 800 mg by mouth every 8 (eight) hours as needed for moderate pain (following dental implants).    [provider]  insulin glargine (LANTUS SOLOSTAR) 100 UNIT/ML Solostar Pen ADMINISTER 66 UNITS UNDER THE SKIN AT BEDTIME Patient taking differently: 70 Units daily. ADMINISTER 66 UNITS UNDER THE SKIN AT BEDTIME 04/21/22   Elayne Snare, MD  insulin lispro (HUMALOG KWIKPEN) 100 UNIT/ML  KwikPen ADMINISTER 12 UNITS UNDER THE SKIN BEFORE MEALS 03/13/22   Elayne Snare, MD  levothyroxine (SYNTHROID) 75 MCG tablet Take 1 tablet (75 mcg total) by mouth daily before breakfast. 03/26/22   Elayne Snare, MD  liothyronine (CYTOMEL) 5 MCG tablet TAKE 1 TABLET BY MOUTH DAILY WITH LEVOTHYROXINE  TAKE BEFORE BREAKFAST 03/26/22   Elayne Snare, MD  lithium carbonate (ESKALITH) 450 MG ER tablet Take 1 tablet (450 mg total) by mouth at bedtime. 02/26/22   [name], PMHNP  loperamide (IMODIUM) 2 MG capsule Take 2 capsules (4 mg total) by mouth 3 (three) times daily as needed for diarrhea or loose stools. 11/12/21   Danford, Suann Larry, MD  loratadine (CLARITIN) 10 MG tablet Take 10 mg by mouth daily as needed for allergies.    [provider]  losartan (COZAAR) 25 MG tablet Take 25 mg by mouth every evening. 08/31/19   [provider]  memantine (NAMENDA) 10 MG tablet TAKE 1/2 TABLET BY MOUTH EVERY EVENING FOR ONE WEEK, THEN STOP 05/18/22   [name], PMHNP  Microlet Lancets MISC TEST DAILY AS DIRECTED 08/01/21   Elayne Snare, MD  nitroGLYCERIN (NITROSTAT) 0.4 MG SL tablet Place 1 tablet (0.4 mg total) under the tongue every 5 (five) minutes as needed for chest pain. 09/14/17   Lyda Jester M, PA-C  ondansetron (ZOFRAN) 4 MG tablet Take 1 tablet (4 mg total) by mouth every 6 (six) hours. 09/20/21   Curatolo, Adam, DO  oxyCODONE-acetaminophen (PERCOCET/ROXICET) 5-325 MG tablet Take 1-2 tablets by mouth every 6 (six) hours as needed for severe pain. 03/31/22   Regan Lemming, MD  PEG 3350-KCl-NaBcb-NaCl-NaSulf (PEG-3350/ELECTROLYTES) 236 g SOLR Take 17 g by mouth daily as needed. 10/06/21   [provider]  QUEtiapine (SEROQUEL) 200 MG tablet TAKE 1 TAB PO q 5 pm and 1 tab po QHS 05/18/22   [name], PMHNP  rOPINIRole (REQUIP) 0.5 MG tablet Takes 4 tabs at 5 pm and 3 tabs at bedtime and 1-2 tabs prn 05/18/22   [name], PMHNP  Wheat Dextrin (BENEFIBER PO) Take 1  Dose by mouth daily.    [provider]      Allergies    Codeine, Procaine hcl, Epinephrine, Metformin and related, Ozempic (0.25 or 0.5 mg-dose) [semaglutide(0.25 or 0.'5mg'$ -dos)], Aspirin, Augmentin [amoxicillin-pot clavulanate], Benadryl [diphenhydramine], Diflucan [fluconazole], Dilaudid [hydromorphone hcl], Hydromorphone, Morphine, Other, Prednisone, Tramadol hcl, and Sulfa antibiotics    Review of Systems   Review of Systems  Constitutional:  Negative for fever.  HENT:  Negative for facial swelling.   Eyes:  Negative for redness.  Respiratory:  Negative for wheezing and stridor.   Gastrointestinal:  Negative for abdominal pain.  Musculoskeletal:  Positive for back pain.  All other systems reviewed and are negative.   Physical Exam Updated Vital Signs BP (!) 117/59   Pulse  87   Temp 97.9 F (36.6 C) (Oral)   Resp 12   Ht 5' (1.524 m)   Wt 84 kg   SpO2 93%   BMI 36.17 kg/m  Physical Exam Vitals and nursing note reviewed.  Constitutional:      General: She is not in acute distress.    Appearance: Normal appearance. She is well-developed.  HENT:     Head: Normocephalic and atraumatic.     Nose: Nose normal.  Eyes:     Pupils: Pupils are equal, round, and reactive to light.  Cardiovascular:     Rate and Rhythm: Normal rate and regular rhythm.     Pulses: Normal pulses.     Heart sounds: Normal heart sounds.  Pulmonary:     Effort: Pulmonary effort is normal. No respiratory distress.     Breath sounds: Normal breath sounds.  Abdominal:     General: Bowel sounds are normal. There is no distension.     Palpations: Abdomen is soft.     Tenderness: There is no abdominal tenderness. There is no guarding or rebound.  Genitourinary:    Vagina: No vaginal discharge.  Musculoskeletal:        General: Normal range of motion.     Cervical back: Neck supple.  Skin:    General: Skin is warm and dry.     Capillary Refill: Capillary refill takes less than 2 seconds.      Findings: No erythema or rash.  Neurological:     General: No focal deficit present.     Mental Status: She is alert and oriented to person, place, and time.     Deep Tendon Reflexes: Reflexes normal.  Psychiatric:        Mood and Affect: Mood normal.        Behavior: Behavior normal.     ED Results / Procedures / Treatments   Labs (all labs ordered are listed, but only abnormal results are displayed) Labs Reviewed  URINALYSIS, ROUTINE W REFLEX MICROSCOPIC    EKG None  Radiology DG Lumbar Spine Complete  Result Date: 05/21/2022 CLINICAL DATA:  Pelvic pain. Low back pain radiating into the left leg EXAM: LUMBAR SPINE - COMPLETE 4+ VIEW COMPARISON:  CT 03/29/2022 FINDINGS: There is a curvature of the lower thoracic and upper lumbar spine which is convex to the left. Mild anterolisthesis of L4 on L5 measures 2-3 mm. Multi level disc space narrowing and endplate spurring noted, most advanced at L1-2. No signs of acute fracture. IMPRESSION: 1. Thoracolumbar scoliosis. 2. Lumbar degenerative disc disease 3. No acute abnormality. Electronically Signed   By: Kerby Moors M.D.   On: 05/21/2022 05:37    Procedures Procedures    Medications Ordered in ED Medications  dexamethasone (DECADRON) injection 4 mg (4 mg Intramuscular Given 05/21/22 0505)  ketorolac (TORADOL) injection 30 mg (30 mg Intramuscular Given 05/21/22 0504)    ED Course/ Medical Decision Making/ A&P                             Medical Decision Making Low back pain radiating down the left leg   Amount and/or Complexity of Data Reviewed External Data Reviewed: notes.    Details: Previous notes reviewed  Labs: ordered.    Details: Urine is negative for UTI Radiology: ordered.  Risk Prescription drug management. Risk Details: Patient is somnolent in the room.  Pain medication provided.  Attempted notification of [place] but daughter did  not answer.  Symptoms are chronic in nature.  Stable for discharge.   Strict return.      Final Clinical Impression(s) / ED Diagnoses Return for intractable cough, coughing up blood, fevers > 100.4 unrelieved by medication, shortness of breath, intractable vomiting, chest pain, shortness of breath, weakness, numbness, changes in speech, facial asymmetry, abdominal pain, passing out, Inability to tolerate liquids or food, cough, altered mental status or any concerns. No signs of systemic illness or infection. The patient is nontoxic-appearing on exam and vital signs are within normal limits.  I have reviewed the triage vital signs and the nursing notes. Pertinent labs & imaging results that were available during my care of the patient were reviewed by me and considered in my medical decision making (see chart for details). After history, exam, and medical workup I feel the patient has been appropriately medically screened and is safe for discharge home. Pertinent diagnoses were discussed with the patient. Patient was given return precautions.  Rx / DC Orders ED Discharge Orders          Ordered    diclofenac Sodium (VOLTAREN) 1 % GEL  4 times daily        05/21/22 0554              Katheren Jimmerson, MD 05/21/22 [id number]

## 2022-05-26 DIAGNOSIS — R319 Hematuria, unspecified: Secondary | ICD-10-CM | POA: Diagnosis not present

## 2022-05-26 DIAGNOSIS — N390 Urinary tract infection, site not specified: Secondary | ICD-10-CM | POA: Diagnosis not present

## 2022-05-29 ENCOUNTER — Emergency Department (HOSPITAL_BASED_OUTPATIENT_CLINIC_OR_DEPARTMENT_OTHER)
Admission: EM | Admit: 2022-05-29 | Discharge: 2022-05-29 | Disposition: A | Discharge status: Home / Self Care | Payer: Medicare Other | Attending: Emergency Medicine | Admitting: Emergency Medicine

## 2022-05-29 ENCOUNTER — Emergency Department (HOSPITAL_BASED_OUTPATIENT_CLINIC_OR_DEPARTMENT_OTHER): Payer: Medicare Other

## 2022-05-29 ENCOUNTER — Encounter (HOSPITAL_BASED_OUTPATIENT_CLINIC_OR_DEPARTMENT_OTHER): Payer: Self-pay | Admitting: Emergency Medicine

## 2022-05-29 ENCOUNTER — Other Ambulatory Visit: Payer: Self-pay

## 2022-05-29 DIAGNOSIS — R233 Spontaneous ecchymoses: Secondary | ICD-10-CM | POA: Diagnosis not present

## 2022-05-29 DIAGNOSIS — N200 Calculus of kidney: Secondary | ICD-10-CM | POA: Diagnosis not present

## 2022-05-29 DIAGNOSIS — R109 Unspecified abdominal pain: Secondary | ICD-10-CM | POA: Diagnosis present

## 2022-05-29 DIAGNOSIS — E119 Type 2 diabetes mellitus without complications: Secondary | ICD-10-CM | POA: Insufficient documentation

## 2022-05-29 DIAGNOSIS — Z794 Long term (current) use of insulin: Secondary | ICD-10-CM | POA: Diagnosis not present

## 2022-05-29 DIAGNOSIS — N3000 Acute cystitis without hematuria: Secondary | ICD-10-CM | POA: Insufficient documentation

## 2022-05-29 DIAGNOSIS — K5730 Diverticulosis of large intestine without perforation or abscess without bleeding: Secondary | ICD-10-CM | POA: Diagnosis not present

## 2022-05-29 LAB — PROTIME-INR
INR: 1 (ref 0.8–1.2)
Prothrombin Time: 13.3 seconds (ref 11.4–15.2)

## 2022-05-29 LAB — CBC WITH DIFFERENTIAL/PLATELET
Abs Immature Granulocytes: 0.08 10*3/uL — ABNORMAL HIGH (ref 0.00–0.07)
Basophils Absolute: 0 10*3/uL (ref 0.0–0.1)
Basophils Relative: 0 %
Eosinophils Absolute: 0.5 10*3/uL (ref 0.0–0.5)
Eosinophils Relative: 5 %
HCT: 41.9 % (ref 36.0–46.0)
Hemoglobin: 13.4 g/dL (ref 12.0–15.0)
Immature Granulocytes: 1 %
Lymphocytes Relative: 15 %
Lymphs Abs: 1.3 10*3/uL (ref 0.7–4.0)
MCH: 29.1 pg (ref 26.0–34.0)
MCHC: 32 g/dL (ref 30.0–36.0)
MCV: 91.1 fL (ref 80.0–100.0)
Monocytes Absolute: 0.5 10*3/uL (ref 0.1–1.0)
Monocytes Relative: 5 %
Neutro Abs: 6.5 10*3/uL (ref 1.7–7.7)
Neutrophils Relative %: 74 %
Platelets: 201 10*3/uL (ref 150–400)
RBC: 4.6 MIL/uL (ref 3.87–5.11)
RDW: 13.7 % (ref 11.5–15.5)
WBC: 8.9 10*3/uL (ref 4.0–10.5)
nRBC: 0 % (ref 0.0–0.2)

## 2022-05-29 LAB — URINALYSIS, ROUTINE W REFLEX MICROSCOPIC
Bilirubin Urine: NEGATIVE
Glucose, UA: NEGATIVE mg/dL
Ketones, ur: NEGATIVE mg/dL
Nitrite: NEGATIVE
Specific Gravity, Urine: 1.018 (ref 1.005–1.030)
WBC, UA: >50 WBC/hpf (ref 0–5)
pH: 5.5 (ref 5.0–8.0)

## 2022-05-29 LAB — COMPREHENSIVE METABOLIC PANEL
ALT: 17 U/L (ref 0–44)
AST: 15 U/L (ref 15–41)
Albumin: 4.2 g/dL (ref 3.5–5.0)
Alkaline Phosphatase: 78 U/L (ref 38–126)
Anion gap: 7 (ref 5–15)
BUN: 22 mg/dL (ref 8–23)
CO2: 24 mmol/L (ref 22–32)
Calcium: 10.7 mg/dL — ABNORMAL HIGH (ref 8.9–10.3)
Chloride: 106 mmol/L (ref 98–111)
Creatinine, Ser: 0.94 mg/dL (ref 0.44–1.00)
GFR, Estimated: >60 mL/min (ref >60)
Glucose, Bld: 187 mg/dL — ABNORMAL HIGH (ref 70–99)
Potassium: 3.9 mmol/L (ref 3.5–5.1)
Sodium: 137 mmol/L (ref 135–145)
Total Bilirubin: 0.5 mg/dL (ref 0.3–1.2)
Total Protein: 6.6 g/dL (ref 6.5–8.1)

## 2022-05-29 LAB — VALPROIC ACID LEVEL: Valproic Acid Lvl: 18 ug/mL — ABNORMAL LOW (ref 50.0–100.0)

## 2022-05-29 LAB — LITHIUM LEVEL: Lithium Lvl: 0.48 mmol/L — ABNORMAL LOW (ref 0.60–1.20)

## 2022-05-29 MED ORDER — CEPHALEXIN 500 MG PO CAPS: 500.0000 mg | ORAL_CAPSULE | Freq: Three times a day (TID) | ORAL | 0 refills | Status: DC

## 2022-05-29 MED ORDER — DEXAMETHASONE SODIUM PHOSPHATE 10 MG/ML IJ SOLN
10.0000 mg | Freq: Once | INTRAMUSCULAR | Status: AC
  Administered 2022-05-29: 10 mg via INTRAVENOUS
  Filled 2022-05-29: qty 1

## 2022-05-29 MED ORDER — CEPHALEXIN 250 MG PO CAPS
500.0000 mg | ORAL_CAPSULE | Freq: Once | ORAL | Status: AC
  Administered 2022-05-29: 500 mg via ORAL
  Filled 2022-05-29: qty 2

## 2022-05-29 NOTE — ED Provider Notes (Signed)
[facility] Provider Note   CSN: [id number] Arrival date & time: 05/29/22  0845     History  Chief Complaint  Patient presents with   Rash    [name] [name] is a 77 y.o. female.  Patient with a history of diabetes, bipolar disorder, IBS, diverticulosis presenting with painful rash to lower extremities for the past 2 days.  Denies any fall or injury.  Was started on nitrofurantoin for UTI 2 days ago and is not sure if this is related.  Denies any fevers, chills, nausea or vomiting.  No recent bleeding from gums or anywhere else.  No black or bloody stools.  No chest pain or shortness of breath.  Does have lower abdominal discomfort that she attributes to her UTI.  No history of blood dyscrasias.  No anticoagulation use.  Questionable history of lupus per patient but has never been diagnosed with that or had any medications for it. She does take lithium and Depakote for bipolar disorder.  The history is provided by the patient.  Rash Associated symptoms: abdominal pain   Associated symptoms: no fever, no headaches, no joint pain, no myalgias, no nausea, no shortness of breath and not vomiting        Home Medications Prior to Admission medications   Medication Sig Start Date End Date Taking? Authorizing Provider  acetaminophen (TYLENOL) 325 MG tablet Take 2 tablets (650 mg total) by mouth every 6 (six) hours as needed. 02/11/21   Jeanell Sparrow, DO  ALPRAZolam Duanne Moron) 0.5 MG tablet Take 1 tablet (0.5 mg total) by mouth 4 (four) times daily as needed for anxiety. 02/18/22   [name], PMHNP  atorvastatin (LIPITOR) 40 MG tablet Take 40 mg by mouth every evening.    [provider]  b complex vitamins capsule Take 1 capsule by mouth daily.    [provider]  BD PEN NEEDLE NANO 2ND GEN 32G X 4 MM MISC USE TO INJECT 5 TIMES DAILY AS DIRECTED 03/16/22   Elayne Snare, MD  benzonatate (TESSALON) 100 MG capsule Take by mouth.  03/07/22   [provider]  bismuth subsalicylate (PEPTO BISMOL) 262 MG/15ML suspension Take 30 mLs by mouth every 6 (six) hours as needed.    [provider]  calcium carbonate (TUMS EX) 750 MG chewable tablet Chew 1 tablet by mouth daily.    [provider]  cholecalciferol (VITAMIN D3) 25 MCG (1000 UNIT) tablet Take 3,000 Units by mouth daily. Also takes 2,000 units capsule at bedtime    [provider]  CONTOUR NEXT TEST test strip USE TO TEST BLOOD SUGAR TWICE DAILY 08/01/21   Elayne Snare, MD  cyclobenzaprine (FLEXERIL) 5 MG tablet Take 5 mg by mouth 3 (three) times daily as needed for muscle spasms.    [provider]  diclofenac (VOLTAREN) 75 MG EC tablet 1 tablet Orally Twice a day for 30 days 05/11/22   [provider]  diclofenac Sodium (VOLTAREN) 1 % GEL Apply 4 g topically 4 (four) times daily. 05/21/22   Palumbo, April, MD  dicyclomine (BENTYL) 20 MG tablet Take 1 tablet (20 mg total) by mouth 3 (three) times daily as needed for spasms. 06/17/21   Shelly Coss, MD  diphenhydrAMINE-PE-APAP [facility] SEVERE COLD NIGHTTIME PO) Take by mouth.    [provider]  divalproex (DEPAKOTE) 250 MG DR tablet Take 1 tablet (250 mg total) by mouth 2 (two) times daily AND 3 tablets (750 mg total) at  bedtime. Patient not taking: Reported on 05/06/2022 03/16/22 05/15/22  [name], PMHNP  donepezil (ARICEPT) 10 MG tablet Take 1/2 tablet daily 05/18/22   [name], PMHNP  ezetimibe (ZETIA) 10 MG tablet Take 1 tablet (10 mg total) by mouth daily. 10/07/21   Elayne Snare, MD  fluticasone Asencion Islam) 50 MCG/ACT nasal spray Place into both nostrils as needed for allergies or rhinitis.    [provider]  gabapentin (NEURONTIN) 600 MG tablet Take 600 mg by mouth 3 (three) times daily. 05/12/19   [provider]  ibuprofen (ADVIL) 800 MG tablet Take 800 mg by mouth every 8 (eight) hours as needed for moderate pain (following dental  implants).    [provider]  insulin glargine (LANTUS SOLOSTAR) 100 UNIT/ML Solostar Pen ADMINISTER 66 UNITS UNDER THE SKIN AT BEDTIME Patient taking differently: 70 Units daily. ADMINISTER 66 UNITS UNDER THE SKIN AT BEDTIME 04/21/22   Elayne Snare, MD  insulin lispro (HUMALOG KWIKPEN) 100 UNIT/ML KwikPen ADMINISTER 12 UNITS UNDER THE SKIN BEFORE MEALS 03/13/22   Elayne Snare, MD  levothyroxine (SYNTHROID) 75 MCG tablet Take 1 tablet (75 mcg total) by mouth daily before breakfast. 03/26/22   Elayne Snare, MD  liothyronine (CYTOMEL) 5 MCG tablet TAKE 1 TABLET BY MOUTH DAILY WITH LEVOTHYROXINE  TAKE BEFORE BREAKFAST 03/26/22   Elayne Snare, MD  lithium carbonate (ESKALITH) 450 MG ER tablet Take 1 tablet (450 mg total) by mouth at bedtime. 02/26/22   [name], PMHNP  loperamide (IMODIUM) 2 MG capsule Take 2 capsules (4 mg total) by mouth 3 (three) times daily as needed for diarrhea or loose stools. 11/12/21   Danford, Suann Larry, MD  loratadine (CLARITIN) 10 MG tablet Take 10 mg by mouth daily as needed for allergies.    [provider]  losartan (COZAAR) 25 MG tablet Take 25 mg by mouth every evening. 08/31/19   [provider]  memantine (NAMENDA) 10 MG tablet TAKE 1/2 TABLET BY MOUTH EVERY EVENING FOR ONE WEEK, THEN STOP 05/18/22   [name], PMHNP  Microlet Lancets MISC TEST DAILY AS DIRECTED 08/01/21   Elayne Snare, MD  nitroGLYCERIN (NITROSTAT) 0.4 MG SL tablet Place 1 tablet (0.4 mg total) under the tongue every 5 (five) minutes as needed for chest pain. 09/14/17   Lyda Jester M, PA-C  ondansetron (ZOFRAN) 4 MG tablet Take 1 tablet (4 mg total) by mouth every 6 (six) hours. 09/20/21   Curatolo, Adam, DO  oxyCODONE-acetaminophen (PERCOCET/ROXICET) 5-325 MG tablet Take 1-2 tablets by mouth every 6 (six) hours as needed for severe pain. 03/31/22   Regan Lemming, MD  PEG 3350-KCl-NaBcb-NaCl-NaSulf (PEG-3350/ELECTROLYTES) 236 g SOLR Take 17 g by mouth daily as needed.  10/06/21   [provider]  QUEtiapine (SEROQUEL) 200 MG tablet TAKE 1 TAB PO q 5 pm and 1 tab po QHS 05/18/22   [name], PMHNP  rOPINIRole (REQUIP) 0.5 MG tablet Takes 4 tabs at 5 pm and 3 tabs at bedtime and 1-2 tabs prn 05/18/22   [name], PMHNP  Wheat Dextrin (BENEFIBER PO) Take 1 Dose by mouth daily.    [provider]      Allergies    Codeine, Procaine hcl, Epinephrine, Metformin and related, Ozempic (0.25 or 0.5 mg-dose) [semaglutide(0.25 or 0.5mg -dos)], Aspirin, Augmentin [amoxicillin-pot clavulanate], Benadryl [diphenhydramine], Diflucan [fluconazole], Dilaudid [hydromorphone hcl], Hydromorphone, Morphine, Other, Prednisone, Tramadol hcl, and Sulfa antibiotics    Review of Systems   Review of Systems  Constitutional:  Negative for activity change, appetite  change and fever.  HENT:  Negative for congestion and rhinorrhea.   Respiratory:  Negative for cough, chest tightness and shortness of breath.   Gastrointestinal:  Positive for abdominal pain. Negative for nausea and vomiting.  Genitourinary:  Positive for dysuria.  Musculoskeletal:  Negative for arthralgias and myalgias.  Skin:  Positive for rash.  Neurological:  Negative for dizziness, weakness and headaches.   all other systems are negative except as noted in the HPI and PMH.    Physical Exam Updated Vital Signs BP (!) 141/67 (BP Location: Right Arm)   Pulse 92   Temp 97.6 F (36.4 C) (Oral)   Resp 18   SpO2 94%  Physical Exam Vitals and nursing note reviewed.  Constitutional:      General: She is not in acute distress.    Appearance: She is well-developed. She is not ill-appearing.  HENT:     Head: Normocephalic and atraumatic.     Mouth/Throat:     Pharynx: No oropharyngeal exudate.  Eyes:     Conjunctiva/sclera: Conjunctivae normal.     Pupils: Pupils are equal, round, and reactive to light.  Neck:     Comments: No meningismus. Cardiovascular:     Rate and Rhythm: Normal  rate and regular rhythm.     Heart sounds: Normal heart sounds. No murmur heard. Pulmonary:     Effort: Pulmonary effort is normal. No respiratory distress.     Breath sounds: Normal breath sounds.  Chest:     Chest wall: No tenderness.  Abdominal:     Palpations: Abdomen is soft.     Tenderness: There is no abdominal tenderness. There is no guarding or rebound.  Musculoskeletal:        General: No tenderness. Normal range of motion.     Cervical back: Normal range of motion and neck supple.  Skin:    General: Skin is warm.     Findings: Rash present.     Comments: Petechial appearing rash to lower extremities as depicted.  Intact DP and PT pulses.  Compartments are soft.  Full range of motion of bilateral hips, knees and ankles.  Neurological:     Mental Status: She is alert and oriented to person, place, and time.     Cranial Nerves: No cranial nerve deficit.     Motor: No abnormal muscle tone.     Coordination: Coordination normal.     Comments:  5/5 strength throughout. CN 2-12 intact.Equal grip strength.   Psychiatric:        Behavior: Behavior normal.        ED Results / Procedures / Treatments   Labs (all labs ordered are listed, but only abnormal results are displayed) Labs Reviewed  CBC WITH DIFFERENTIAL/PLATELET - Abnormal; Notable for the following components:      Result Value   Abs Immature Granulocytes 0.08 (*)    All other components within normal limits  COMPREHENSIVE METABOLIC PANEL - Abnormal; Notable for the following components:   Glucose, Bld 187 (*)    Calcium 10.7 (*)    All other components within normal limits  URINALYSIS, ROUTINE W REFLEX MICROSCOPIC - Abnormal; Notable for the following components:   Hgb urine dipstick TRACE (*)    Protein, ur TRACE (*)    Leukocytes,Ua LARGE (*)    Bacteria, UA MANY (*)    Non Squamous Epithelial 0-5 (*)    All other components within normal limits  LITHIUM LEVEL - Abnormal; Notable for the following  components:  Lithium Lvl 0.48 (*)    All other components within normal limits  VALPROIC ACID LEVEL - Abnormal; Notable for the following components:   Valproic Acid Lvl 18 (*)    All other components within normal limits  URINE CULTURE  PROTIME-INR    EKG None  Radiology CT Renal Stone Study  Result Date: 05/29/2022 CLINICAL DATA:  Abdominal and flank pain suspected kidney stone, laterality not specified EXAM: CT ABDOMEN AND PELVIS WITHOUT CONTRAST TECHNIQUE: Multidetector CT imaging of the abdomen and pelvis was performed following the standard protocol without IV contrast. RADIATION DOSE REDUCTION: This exam was performed according to the departmental dose-optimization program which includes automated exposure control, adjustment of the mA and/or kV according to patient size and/or use of iterative reconstruction technique. COMPARISON:  03/29/2022 FINDINGS: Lower chest: Minimal atelectasis versus scarring at base of lingula. Hepatobiliary: Gallbladder and liver normal appearance Pancreas: Normal appearance Spleen: Normal appearance Adrenals/Urinary Tract: Adrenal glands normal appearance. Tiny nonobstructing renal calculi bilaterally. Mild chronic prominence of RIGHT renal pelvis similar to previous exam. No urinary tract calcification, new hydronephrosis or ureteral dilatation, or ureteral calcification. No renal masses. Bladder unremarkable. Stomach/Bowel: Prior sigmoid anastomosis. Minimal colonic diverticulosis without evidence of diverticulitis. Stomach decompressed. Remaining bowel loops unremarkable. Appendix not identified. Vascular/Lymphatic: Atherosclerotic calcifications aorta and iliac arteries without aneurysm. No adenopathy. Reproductive: Uterus surgically absent.  Atrophic ovaries. Other: Small umbilical hernia containing fat. No free air or free fluid.  No inflammatory process. Musculoskeletal: Degenerative disc disease changes L1-L2. IMPRESSION: Tiny nonobstructing renal calculi  bilaterally. Minimal colonic diverticulosis without evidence of diverticulitis. Small umbilical hernia containing fat. No acute intra-abdominal or intrapelvic abnormalities. Aortic Atherosclerosis (ICD10-I70.0). Electronically Signed   By: Lavonia Dana M.D.   On: 05/29/2022 12:59    Procedures Procedures    Medications Ordered in ED Medications - No data to display  ED Course/ Medical Decision Making/ A&P                             Medical Decision Making Amount and/or Complexity of Data Reviewed Labs: ordered. Decision-making details documented in ED Course. Radiology: ordered and independent interpretation performed. Decision-making details documented in ED Course. ECG/medicine tests: ordered and independent interpretation performed. Decision-making details documented in ED Course.  Risk Prescription drug management.   Lower extremity rash x 2 days that appears petechial origin.  Vitals are stable, nontoxic-appearing, no fever.  Will check labs to evaluate for thrombocytopenia.  May be due to recent nitrofurantoin use as well.  Hemoglobin is stable.  Platelet count 201.  She is not thrombocytopenic.  Labs reassuring.  Depakote level still pending.  Lithium level is nontoxic.  Suspect petechial rash likely secondary to nitrofurantoin use.  Consider other causes of vasculitis.  Patient hesitant to try steroids given blood sugar problems in the past.  She is agreeable to a one-time shot of Decadron and cautioned to watch her blood sugars closely over the next several days.  Stop taking nitrofurantoin.  Will switch to Keflex.  CT scan is reassuring that shows no evidence of ureter tract calculi or other acute pathology.  Return to the ED with new or worsening symptoms.  Watch blood sugars closely over the next several days and adjust insulin as appropriate.       Final Clinical Impression(s) / ED Diagnoses Final diagnoses:  Petechial rash  Acute cystitis without hematuria     Rx / DC Orders ED Discharge Orders  None         Ezequiel Essex, MD 05/29/22 1328

## 2022-05-29 NOTE — Discharge Instructions (Signed)
We suspect your rash could be coming from the Macrobid use.  Stop taking that medication.  Take Keflex instead.  Watch blood sugar closely after giving a dose of steroids.  Follow-up with your primary doctor as well as a dermatologist for further assessment of the rash.  Return to the ED with new or worsening symptoms.

## 2022-05-29 NOTE — ED Triage Notes (Signed)
Pt has rash on bilateral lower legs. Denies fevers, SHOB.

## 2022-05-30 LAB — URINE CULTURE: Culture: NO GROWTH

## 2022-06-01 DIAGNOSIS — R0981 Nasal congestion: Secondary | ICD-10-CM | POA: Diagnosis not present

## 2022-06-01 DIAGNOSIS — R051 Acute cough: Secondary | ICD-10-CM | POA: Diagnosis not present

## 2022-06-01 DIAGNOSIS — U071 COVID-19: Secondary | ICD-10-CM | POA: Diagnosis not present

## 2022-06-01 DIAGNOSIS — J029 Acute pharyngitis, unspecified: Secondary | ICD-10-CM | POA: Diagnosis not present

## 2022-06-01 DIAGNOSIS — R52 Pain, unspecified: Secondary | ICD-10-CM | POA: Diagnosis not present

## 2022-06-02 ENCOUNTER — Telehealth: Payer: Self-pay

## 2022-06-02 NOTE — Telephone Encounter (Signed)
Patient notified

## 2022-06-03 ENCOUNTER — Emergency Department (HOSPITAL_BASED_OUTPATIENT_CLINIC_OR_DEPARTMENT_OTHER): Payer: Medicare Other | Admitting: Radiology

## 2022-06-03 ENCOUNTER — Other Ambulatory Visit: Payer: Self-pay

## 2022-06-03 ENCOUNTER — Inpatient Hospital Stay (HOSPITAL_BASED_OUTPATIENT_CLINIC_OR_DEPARTMENT_OTHER)
Admission: EM | Admit: 2022-06-03 | Discharge: 2022-06-05 | DRG: 177 | Disposition: A | Discharge status: Home / Self Care | Payer: Medicare Other | Attending: Internal Medicine | Admitting: Internal Medicine

## 2022-06-03 ENCOUNTER — Encounter (HOSPITAL_BASED_OUTPATIENT_CLINIC_OR_DEPARTMENT_OTHER): Payer: Self-pay | Admitting: Emergency Medicine

## 2022-06-03 DIAGNOSIS — M19042 Primary osteoarthritis, left hand: Secondary | ICD-10-CM | POA: Diagnosis present

## 2022-06-03 DIAGNOSIS — G2581 Restless legs syndrome: Secondary | ICD-10-CM | POA: Diagnosis not present

## 2022-06-03 DIAGNOSIS — L930 Discoid lupus erythematosus: Secondary | ICD-10-CM | POA: Diagnosis present

## 2022-06-03 DIAGNOSIS — Z794 Long term (current) use of insulin: Secondary | ICD-10-CM | POA: Diagnosis not present

## 2022-06-03 DIAGNOSIS — R630 Anorexia: Secondary | ICD-10-CM | POA: Diagnosis not present

## 2022-06-03 DIAGNOSIS — Z813 Family history of other psychoactive substance abuse and dependence: Secondary | ICD-10-CM

## 2022-06-03 DIAGNOSIS — J189 Pneumonia, unspecified organism: Secondary | ICD-10-CM | POA: Diagnosis not present

## 2022-06-03 DIAGNOSIS — G8929 Other chronic pain: Secondary | ICD-10-CM | POA: Diagnosis not present

## 2022-06-03 DIAGNOSIS — M797 Fibromyalgia: Secondary | ICD-10-CM | POA: Diagnosis not present

## 2022-06-03 DIAGNOSIS — E1159 Type 2 diabetes mellitus with other circulatory complications: Secondary | ICD-10-CM | POA: Diagnosis present

## 2022-06-03 DIAGNOSIS — M19041 Primary osteoarthritis, right hand: Secondary | ICD-10-CM | POA: Diagnosis not present

## 2022-06-03 DIAGNOSIS — R0902 Hypoxemia: Secondary | ICD-10-CM | POA: Diagnosis not present

## 2022-06-03 DIAGNOSIS — Z8249 Family history of ischemic heart disease and other diseases of the circulatory system: Secondary | ICD-10-CM

## 2022-06-03 DIAGNOSIS — U071 COVID-19: Secondary | ICD-10-CM | POA: Diagnosis not present

## 2022-06-03 DIAGNOSIS — I152 Hypertension secondary to endocrine disorders: Secondary | ICD-10-CM | POA: Diagnosis not present

## 2022-06-03 DIAGNOSIS — H9193 Unspecified hearing loss, bilateral: Secondary | ICD-10-CM | POA: Diagnosis present

## 2022-06-03 DIAGNOSIS — Z882 Allergy status to sulfonamides status: Secondary | ICD-10-CM

## 2022-06-03 DIAGNOSIS — E039 Hypothyroidism, unspecified: Secondary | ICD-10-CM | POA: Diagnosis not present

## 2022-06-03 DIAGNOSIS — E1143 Type 2 diabetes mellitus with diabetic autonomic (poly)neuropathy: Secondary | ICD-10-CM | POA: Diagnosis present

## 2022-06-03 DIAGNOSIS — F319 Bipolar disorder, unspecified: Secondary | ICD-10-CM | POA: Diagnosis present

## 2022-06-03 DIAGNOSIS — R110 Nausea: Secondary | ICD-10-CM | POA: Diagnosis not present

## 2022-06-03 DIAGNOSIS — E1169 Type 2 diabetes mellitus with other specified complication: Secondary | ICD-10-CM | POA: Diagnosis present

## 2022-06-03 DIAGNOSIS — F419 Anxiety disorder, unspecified: Secondary | ICD-10-CM | POA: Diagnosis present

## 2022-06-03 DIAGNOSIS — K760 Fatty (change of) liver, not elsewhere classified: Secondary | ICD-10-CM | POA: Diagnosis present

## 2022-06-03 DIAGNOSIS — Z79890 Hormone replacement therapy (postmenopausal): Secondary | ICD-10-CM

## 2022-06-03 DIAGNOSIS — Z811 Family history of alcohol abuse and dependence: Secondary | ICD-10-CM

## 2022-06-03 DIAGNOSIS — K3184 Gastroparesis: Secondary | ICD-10-CM | POA: Diagnosis present

## 2022-06-03 DIAGNOSIS — J9601 Acute respiratory failure with hypoxia: Secondary | ICD-10-CM | POA: Diagnosis present

## 2022-06-03 DIAGNOSIS — E119 Type 2 diabetes mellitus without complications: Secondary | ICD-10-CM | POA: Diagnosis not present

## 2022-06-03 DIAGNOSIS — R059 Cough, unspecified: Secondary | ICD-10-CM | POA: Diagnosis not present

## 2022-06-03 DIAGNOSIS — M479 Spondylosis, unspecified: Secondary | ICD-10-CM | POA: Diagnosis present

## 2022-06-03 DIAGNOSIS — F39 Unspecified mood [affective] disorder: Secondary | ICD-10-CM | POA: Diagnosis not present

## 2022-06-03 DIAGNOSIS — Z888 Allergy status to other drugs, medicaments and biological substances status: Secondary | ICD-10-CM

## 2022-06-03 DIAGNOSIS — Z818 Family history of other mental and behavioral disorders: Secondary | ICD-10-CM

## 2022-06-03 DIAGNOSIS — J1282 Pneumonia due to coronavirus disease 2019: Secondary | ICD-10-CM | POA: Diagnosis not present

## 2022-06-03 DIAGNOSIS — G9332 Myalgic encephalomyelitis/chronic fatigue syndrome: Secondary | ICD-10-CM | POA: Diagnosis not present

## 2022-06-03 DIAGNOSIS — N390 Urinary tract infection, site not specified: Secondary | ICD-10-CM | POA: Diagnosis not present

## 2022-06-03 DIAGNOSIS — Z886 Allergy status to analgesic agent status: Secondary | ICD-10-CM

## 2022-06-03 DIAGNOSIS — E785 Hyperlipidemia, unspecified: Secondary | ICD-10-CM | POA: Diagnosis not present

## 2022-06-03 DIAGNOSIS — Z885 Allergy status to narcotic agent status: Secondary | ICD-10-CM

## 2022-06-03 DIAGNOSIS — Z96652 Presence of left artificial knee joint: Secondary | ICD-10-CM | POA: Diagnosis present

## 2022-06-03 DIAGNOSIS — Z8349 Family history of other endocrine, nutritional and metabolic diseases: Secondary | ICD-10-CM

## 2022-06-03 DIAGNOSIS — Z79899 Other long term (current) drug therapy: Secondary | ICD-10-CM

## 2022-06-03 DIAGNOSIS — J159 Unspecified bacterial pneumonia: Secondary | ICD-10-CM | POA: Diagnosis not present

## 2022-06-03 DIAGNOSIS — Z96641 Presence of right artificial hip joint: Secondary | ICD-10-CM | POA: Diagnosis present

## 2022-06-03 DIAGNOSIS — Z880 Allergy status to penicillin: Secondary | ICD-10-CM

## 2022-06-03 LAB — CBC WITH DIFFERENTIAL/PLATELET
Abs Immature Granulocytes: 0.07 10*3/uL (ref 0.00–0.07)
Basophils Absolute: 0 10*3/uL (ref 0.0–0.1)
Basophils Relative: 0 %
Eosinophils Absolute: 0.2 10*3/uL (ref 0.0–0.5)
Eosinophils Relative: 4 %
HCT: 42.2 % (ref 36.0–46.0)
Hemoglobin: 13.3 g/dL (ref 12.0–15.0)
Immature Granulocytes: 1 %
Lymphocytes Relative: 34 %
Lymphs Abs: 1.8 10*3/uL (ref 0.7–4.0)
MCH: 29 pg (ref 26.0–34.0)
MCHC: 31.5 g/dL (ref 30.0–36.0)
MCV: 91.9 fL (ref 80.0–100.0)
Monocytes Absolute: 0.4 10*3/uL (ref 0.1–1.0)
Monocytes Relative: 8 %
Neutro Abs: 2.7 10*3/uL (ref 1.7–7.7)
Neutrophils Relative %: 53 %
Platelets: 172 10*3/uL (ref 150–400)
RBC: 4.59 MIL/uL (ref 3.87–5.11)
RDW: 13.8 % (ref 11.5–15.5)
WBC: 5.2 10*3/uL (ref 4.0–10.5)
nRBC: 0 % (ref 0.0–0.2)

## 2022-06-03 LAB — COMPREHENSIVE METABOLIC PANEL
ALT: 22 U/L (ref 0–44)
AST: 19 U/L (ref 15–41)
Albumin: 3.9 g/dL (ref 3.5–5.0)
Alkaline Phosphatase: 78 U/L (ref 38–126)
Anion gap: 7 (ref 5–15)
BUN: 10 mg/dL (ref 8–23)
CO2: 26 mmol/L (ref 22–32)
Calcium: 10.3 mg/dL (ref 8.9–10.3)
Chloride: 108 mmol/L (ref 98–111)
Creatinine, Ser: 0.81 mg/dL (ref 0.44–1.00)
GFR, Estimated: >60 mL/min (ref >60)
Glucose, Bld: 103 mg/dL — ABNORMAL HIGH (ref 70–99)
Potassium: 4.1 mmol/L (ref 3.5–5.1)
Sodium: 141 mmol/L (ref 135–145)
Total Bilirubin: 0.3 mg/dL (ref 0.3–1.2)
Total Protein: 6.6 g/dL (ref 6.5–8.1)

## 2022-06-03 LAB — SARS CORONAVIRUS 2 BY RT PCR: SARS Coronavirus 2 by RT PCR: POSITIVE — AB

## 2022-06-03 LAB — MAGNESIUM: Magnesium: 1.8 mg/dL (ref 1.7–2.4)

## 2022-06-03 LAB — GLUCOSE, CAPILLARY: Glucose-Capillary: 56 mg/dL — ABNORMAL LOW (ref 70–99)

## 2022-06-03 MED ORDER — SODIUM CHLORIDE 0.9 % IV SOLN
1.0000 g | Freq: Once | INTRAVENOUS | Status: AC
  Administered 2022-06-03: 1 g via INTRAVENOUS
  Filled 2022-06-03: qty 10

## 2022-06-03 MED ORDER — DOXYCYCLINE HYCLATE 100 MG PO TABS
100.0000 mg | ORAL_TABLET | Freq: Once | ORAL | Status: AC
  Administered 2022-06-03: 100 mg via ORAL
  Filled 2022-06-03: qty 1

## 2022-06-03 MED ORDER — SODIUM CHLORIDE 0.9 % IV BOLUS
1000.0000 mL | Freq: Once | INTRAVENOUS | Status: AC
  Administered 2022-06-03: 1000 mL via INTRAVENOUS

## 2022-06-03 NOTE — Progress Notes (Signed)
Plan of Care Note for accepted transfer   Patient: [name] [name] MRN: [id number]   [place]: 06/03/2022  Facility requesting transfer: [facility] Requesting Provider: Ezequiel Essex, MD  Reason for transfer: Acute respiratory failure with hypoxia due to COVID-19 pneumonia Facility course: [name] [name] is a 77 y.o. female, with a history of diabetes, bipolar disorder, IBS, diverticulosis presenting with painful rash to lower extremities for the past 2 days.  Denies any fall or injury.  Was started on nitrofurantoin for UTI 2 days ago and is not sure if this is related.  Denies any fevers, chills, nausea or vomiting.  No recent bleeding from gums or anywhere else.  No black or bloody stools.  No chest pain or shortness of breath.  Does have lower abdominal discomfort that she attributes to her UTI.  No history of blood dyscrasias.  No anticoagulation use.  Questionable history of lupus per patient but has never been diagnosed with that or had any medications for it. She does take lithium and Depakote for bipolar disorder.  When she came to the ER, vital signs were unremarkable.  Labs revealed unremarkable CMP and CBC.  Outpatient COVID-19 test at her PCPs office was positive on Monday.  She was started on Paxlovid then.  2 view chest x-ray showed patchy infiltration in the lung bases possibly indicative of early multifocal pneumonia.  The patient was started on IV Rocephin and p.o. doxycycline as well as given 1 L bolus of IV normal saline.  Plan of care: The patient is accepted for admission to Telemetry unit, at [facility]..   She will be under the care and responsibility of the ED provider until arrival to [place].  Author: Christel Mormon, MD 06/03/2022  Check [email or web address] for on-call coverage.  Nursing staff, Please call [place] number on Amion as soon as patient's arrival, so appropriate admitting provider can evaluate the pt.

## 2022-06-03 NOTE — ED Provider Notes (Signed)
[facility] Provider Note   CSN: [id number] Arrival date & time: 06/03/22  1953     History  Chief Complaint  Patient presents with   Cough    [name] [name] is a 77 y.o. female presenting Emergency Department with generalized fatigue,, cough, congestion.  Patient reports that her cough and respiratory symptoms began 6 days ago.  They have gotten worse.  She is extremely fatigued.  She is loss of appetite.  She says her doctor tested her and she was positive for COVID-19 as an outpatient, she was started on Paxil that.  She feels continuously fatigued and short of breath.  HPI     Home Medications Prior to Admission medications   Medication Sig Start Date End Date Taking? Authorizing Provider  acetaminophen (TYLENOL) 325 MG tablet Take 2 tablets (650 mg total) by mouth every 6 (six) hours as needed. 02/11/21   Jeanell Sparrow, DO  ALPRAZolam Duanne Moron) 0.5 MG tablet Take 1 tablet (0.5 mg total) by mouth 4 (four) times daily as needed for anxiety. 02/18/22   [name], PMHNP  atorvastatin (LIPITOR) 40 MG tablet Take 40 mg by mouth every evening.    [provider]  b complex vitamins capsule Take 1 capsule by mouth daily.    [provider]  BD PEN NEEDLE NANO 2ND GEN 32G X 4 MM MISC USE TO INJECT 5 TIMES DAILY AS DIRECTED 03/16/22   Elayne Snare, MD  benzonatate (TESSALON) 100 MG capsule Take by mouth. 03/07/22   [provider]  bismuth subsalicylate (PEPTO BISMOL) 262 MG/15ML suspension Take 30 mLs by mouth every 6 (six) hours as needed.    [provider]  calcium carbonate (TUMS EX) 750 MG chewable tablet Chew 1 tablet by mouth daily.    [provider]  cephALEXin (KEFLEX) 500 MG capsule Take 1 capsule (500 mg total) by mouth 3 (three) times daily. 05/29/22   Rancour, Annie Main, MD  cholecalciferol (VITAMIN D3) 25 MCG (1000 UNIT) tablet Take 3,000 Units by mouth daily. Also takes 2,000 units  capsule at bedtime    [provider]  CONTOUR NEXT TEST test strip USE TO TEST BLOOD SUGAR TWICE DAILY 08/01/21   Elayne Snare, MD  cyclobenzaprine (FLEXERIL) 5 MG tablet Take 5 mg by mouth 3 (three) times daily as needed for muscle spasms.    [provider]  diclofenac (VOLTAREN) 75 MG EC tablet 1 tablet Orally Twice a day for 30 days 05/11/22   [provider]  diclofenac Sodium (VOLTAREN) 1 % GEL Apply 4 g topically 4 (four) times daily. 05/21/22   Palumbo, April, MD  dicyclomine (BENTYL) 20 MG tablet Take 1 tablet (20 mg total) by mouth 3 (three) times daily as needed for spasms. 06/17/21   Shelly Coss, MD  diphenhydrAMINE-PE-APAP [facility] SEVERE COLD NIGHTTIME PO) Take by mouth.    [provider]  divalproex (DEPAKOTE) 250 MG DR tablet Take 1 tablet (250 mg total) by mouth 2 (two) times daily AND 3 tablets (750 mg total) at bedtime. Patient not taking: Reported on 05/06/2022 03/16/22 05/15/22  [name], PMHNP  donepezil (ARICEPT) 10 MG tablet Take 1/2 tablet daily 05/18/22   [name], PMHNP  ezetimibe (ZETIA) 10 MG tablet Take 1 tablet (10 mg total) by mouth daily. 10/07/21   Elayne Snare, MD  fluticasone Asencion Islam) 50 MCG/ACT nasal spray Place into both nostrils as needed for allergies or rhinitis.    [provider]  gabapentin (NEURONTIN) 600 MG tablet Take 600 mg by mouth 3 (three) times daily. 05/12/19   [provider]  ibuprofen (ADVIL) 800 MG tablet Take 800 mg by mouth every 8 (eight) hours as needed for moderate pain (following dental implants).    [provider]  insulin glargine (LANTUS SOLOSTAR) 100 UNIT/ML Solostar Pen ADMINISTER 66 UNITS UNDER THE SKIN AT BEDTIME Patient taking differently: 70 Units daily. ADMINISTER 66 UNITS UNDER THE SKIN AT BEDTIME 04/21/22   Elayne Snare, MD  insulin lispro (HUMALOG KWIKPEN) 100 UNIT/ML KwikPen ADMINISTER 12 UNITS UNDER THE SKIN BEFORE MEALS 03/13/22   Elayne Snare, MD   levothyroxine (SYNTHROID) 75 MCG tablet Take 1 tablet (75 mcg total) by mouth daily before breakfast. 03/26/22   Elayne Snare, MD  liothyronine (CYTOMEL) 5 MCG tablet TAKE 1 TABLET BY MOUTH DAILY WITH LEVOTHYROXINE  TAKE BEFORE BREAKFAST 03/26/22   Elayne Snare, MD  lithium carbonate (ESKALITH) 450 MG ER tablet Take 1 tablet (450 mg total) by mouth at bedtime. 02/26/22   [name], PMHNP  loperamide (IMODIUM) 2 MG capsule Take 2 capsules (4 mg total) by mouth 3 (three) times daily as needed for diarrhea or loose stools. 11/12/21   Danford, Suann Larry, MD  loratadine (CLARITIN) 10 MG tablet Take 10 mg by mouth daily as needed for allergies.    [provider]  losartan (COZAAR) 25 MG tablet Take 25 mg by mouth every evening. 08/31/19   [provider]  memantine (NAMENDA) 10 MG tablet TAKE 1/2 TABLET BY MOUTH EVERY EVENING FOR ONE WEEK, THEN STOP 05/18/22   [name], PMHNP  Microlet Lancets MISC TEST DAILY AS DIRECTED 08/01/21   Elayne Snare, MD  nitroGLYCERIN (NITROSTAT) 0.4 MG SL tablet Place 1 tablet (0.4 mg total) under the tongue every 5 (five) minutes as needed for chest pain. 09/14/17   Lyda Jester M, PA-C  ondansetron (ZOFRAN) 4 MG tablet Take 1 tablet (4 mg total) by mouth every 6 (six) hours. 09/20/21   Curatolo, Adam, DO  oxyCODONE-acetaminophen (PERCOCET/ROXICET) 5-325 MG tablet Take 1-2 tablets by mouth every 6 (six) hours as needed for severe pain. 03/31/22   Regan Lemming, MD  PEG 3350-KCl-NaBcb-NaCl-NaSulf (PEG-3350/ELECTROLYTES) 236 g SOLR Take 17 g by mouth daily as needed. 10/06/21   [provider]  QUEtiapine (SEROQUEL) 200 MG tablet TAKE 1 TAB PO q 5 pm and 1 tab po QHS 05/18/22   [name], PMHNP  rOPINIRole (REQUIP) 0.5 MG tablet Takes 4 tabs at 5 pm and 3 tabs at bedtime and 1-2 tabs prn 05/18/22   [name], PMHNP  Wheat Dextrin (BENEFIBER PO) Take 1 Dose by mouth daily.    [provider]      Allergies    Codeine,  Procaine hcl, Epinephrine, Metformin and related, Ozempic (0.25 or 0.5 mg-dose) [semaglutide(0.25 or 0.5mg -dos)], Aspirin, Augmentin [amoxicillin-pot clavulanate], Benadryl [diphenhydramine], Diflucan [fluconazole], Dilaudid [hydromorphone hcl], Hydromorphone, Morphine, Other, Prednisone, Tramadol hcl, and Sulfa antibiotics    Review of Systems   Review of Systems  Physical Exam Updated Vital Signs BP (!) 153/73 (BP Location: Right Arm)   Pulse 81   Temp 98.1 F (36.7 C) (Oral)   Resp 20   Wt 83.9 kg   SpO2 92%   BMI 36.13 kg/m  Physical Exam Constitutional:      General: She is not in acute distress. HENT:     Head: Normocephalic and atraumatic.  Eyes:     Conjunctiva/sclera: Conjunctivae normal.     Pupils:  Pupils are equal, round, and reactive to light.  Cardiovascular:     Rate and Rhythm: Normal rate and regular rhythm.  Pulmonary:     Effort: Pulmonary effort is normal. No respiratory distress.     Comments: 86% on room air, improved to 95% on 4L [place], speaking in full sentences, rhonchi in the lower lobes Abdominal:     General: There is no distension.     Tenderness: There is no abdominal tenderness.  Skin:    General: Skin is warm and dry.  Neurological:     General: No focal deficit present.     Mental Status: She is alert. Mental status is at baseline.  Psychiatric:        Mood and Affect: Mood normal.        Behavior: Behavior normal.     ED Results / Procedures / Treatments   Labs (all labs ordered are listed, but only abnormal results are displayed) Labs Reviewed  COMPREHENSIVE METABOLIC PANEL - Abnormal; Notable for the following components:      Result Value   Glucose, Bld 103 (*)    All other components within normal limits  CBC WITH DIFFERENTIAL/PLATELET  MAGNESIUM    EKG None  Radiology DG Chest 2 View  Result Date: 06/03/2022 CLINICAL DATA:  Evaluation for pneumonia. COVID positive. Productive cough on Monday. Now with worsening cough,  poor appetite, body aches, and nausea. EXAM: CHEST - 2 VIEW COMPARISON:  03/29/2022 FINDINGS: Slightly shallow inspiration. Heart size and pulmonary vascularity are normal. Suggestion of mild patchy infiltration in the lung bases which may indicate early multifocal pneumonia. No pleural effusions. No pneumothorax. Mediastinal contours appear intact. Calcified and tortuous aorta. Thoracolumbar scoliosis convex towards the left. Degenerative changes in the spine. IMPRESSION: Suggestion of patchy infiltration in the lung bases possibly indicating early multifocal pneumonia. Electronically Signed   By: Lucienne Capers M.D.   On: 06/03/2022 21:23    Procedures Procedures    Medications Ordered in ED Medications  sodium chloride 0.9 % bolus 1,000 mL (1,000 mLs Intravenous New Bag/Given 06/03/22 2125)  cefTRIAXone (ROCEPHIN) 1 g in sodium chloride 0.9 % 100 mL IVPB (1 g Intravenous New Bag/Given 06/03/22 2146)  doxycycline (VIBRA-TABS) tablet 100 mg (100 mg Oral Given 06/03/22 2147)    ED Course/ Medical Decision Making/ A&P Clinical Course as of 06/03/22 2222  Wed Jun 03, 2022  2215 Accepted to med tele bed at [facility] long by Dr. [name] Ace, hospitalist [MT]    Clinical Course User Index [MT] Langston Masker Carola Rhine, MD                             Medical Decision Making Amount and/or Complexity of Data Reviewed Labs: ordered. Radiology: ordered.  Risk Prescription drug management. Decision regarding hospitalization.   This patient presents to the ED with concern for cough, fatigue, poor appetite. This involves an extensive number of treatment options, and is a complaint that carries with it a high risk of complications and morbidity.  The differential diagnosis includes COVID viral illness versus superimposed bacterial infection versus anemia versus electrolyte derangement versus other   I ordered and personally interpreted labs.  The pertinent results include: No significant abnormality  I  ordered imaging studies including x-ray of the chest I independently visualized and interpreted imaging which showed potential multifocal infiltrate I agree with the radiologist interpretation  The patient was maintained on a cardiac monitor.  I personally viewed and  interpreted the cardiac monitored which showed an underlying rhythm of: Sinus rhythm   I ordered medication including IV fluid bolus for hydration; IV Rocephin and doxycycline for community pneumonia  I have reviewed the patients home medicines and have made adjustments as needed  Test Considered: Given the patient's productive cough and likely viral syndrome, lower suspicion for acute pulmonary embolism in the setting, do not feel that a CT angiogram is emergently indicated  After the interventions noted above, I reevaluated the patient and found that they have: stayed the same   Dispostion:  After consideration of the diagnostic results and the patients response to treatment, I feel that the patent would benefit from medical admission, given the patient's new oxygen requirement.[name] was evaluated in Emergency Department on 06/03/2022 for the symptoms described in the history of present illness. She was evaluated in the context of the global COVID-19 pandemic, which necessitated consideration that the patient might be at risk for infection with the SARS-CoV-2 virus that causes COVID-19. Institutional protocols and algorithms that pertain to the evaluation of patients at risk for COVID-19 are in a state of rapid change based on information released by regulatory bodies including the CDC and federal and state organizations. These policies and algorithms were followed during the patient's care in the ED.         Final Clinical Impression(s) / ED Diagnoses Final diagnoses:  COVID-19  Hypoxia    Rx / DC Orders ED Discharge Orders     None         Wyvonnia Dusky, MD 06/03/22 2223

## 2022-06-03 NOTE — ED Notes (Signed)
[facility] at bedside to transport pt to [facility] 

## 2022-06-03 NOTE — ED Triage Notes (Signed)
Pt presents from home for productively yellow cough, tested positive for covid on Monday. Was seen by PCP that day and given paxlovid and keflex, presents today for worsening cough, poor appetite, body aches, and nausea.   Denies emesis, fever.

## 2022-06-03 NOTE — ED Notes (Signed)
Patient placed on 2LNC d/t SpO2 maintaining 86-88%. SpO2 currently 94% on [place].

## 2022-06-04 DIAGNOSIS — E119 Type 2 diabetes mellitus without complications: Secondary | ICD-10-CM

## 2022-06-04 DIAGNOSIS — J9601 Acute respiratory failure with hypoxia: Secondary | ICD-10-CM | POA: Diagnosis present

## 2022-06-04 DIAGNOSIS — E785 Hyperlipidemia, unspecified: Secondary | ICD-10-CM

## 2022-06-04 DIAGNOSIS — U071 COVID-19: Secondary | ICD-10-CM

## 2022-06-04 DIAGNOSIS — Z794 Long term (current) use of insulin: Secondary | ICD-10-CM

## 2022-06-04 DIAGNOSIS — J1282 Pneumonia due to coronavirus disease 2019: Secondary | ICD-10-CM

## 2022-06-04 DIAGNOSIS — I152 Hypertension secondary to endocrine disorders: Secondary | ICD-10-CM

## 2022-06-04 DIAGNOSIS — F39 Unspecified mood [affective] disorder: Secondary | ICD-10-CM | POA: Diagnosis present

## 2022-06-04 DIAGNOSIS — E1159 Type 2 diabetes mellitus with other circulatory complications: Secondary | ICD-10-CM

## 2022-06-04 DIAGNOSIS — E1169 Type 2 diabetes mellitus with other specified complication: Secondary | ICD-10-CM | POA: Diagnosis not present

## 2022-06-04 LAB — POTASSIUM: Potassium: 4.8 mmol/L (ref 3.5–5.1)

## 2022-06-04 LAB — CBC
HCT: 42.4 % (ref 36.0–46.0)
Hemoglobin: 13 g/dL (ref 12.0–15.0)
MCH: 28.8 pg (ref 26.0–34.0)
MCHC: 30.7 g/dL (ref 30.0–36.0)
MCV: 94 fL (ref 80.0–100.0)
Platelets: 160 10*3/uL (ref 150–400)
RBC: 4.51 MIL/uL (ref 3.87–5.11)
RDW: 13.9 % (ref 11.5–15.5)
WBC: 5.9 10*3/uL (ref 4.0–10.5)
nRBC: 0 % (ref 0.0–0.2)

## 2022-06-04 LAB — GLUCOSE, CAPILLARY
Glucose-Capillary: 158 mg/dL — ABNORMAL HIGH (ref 70–99)
Glucose-Capillary: 222 mg/dL — ABNORMAL HIGH (ref 70–99)
Glucose-Capillary: 231 mg/dL — ABNORMAL HIGH (ref 70–99)
Glucose-Capillary: 255 mg/dL — ABNORMAL HIGH (ref 70–99)
Glucose-Capillary: 286 mg/dL — ABNORMAL HIGH (ref 70–99)
Glucose-Capillary: 66 mg/dL — ABNORMAL LOW (ref 70–99)

## 2022-06-04 LAB — BASIC METABOLIC PANEL
Anion gap: 8 (ref 5–15)
BUN: 10 mg/dL (ref 8–23)
CO2: 21 mmol/L — ABNORMAL LOW (ref 22–32)
Calcium: 9.6 mg/dL (ref 8.9–10.3)
Chloride: 108 mmol/L (ref 98–111)
Creatinine, Ser: 0.65 mg/dL (ref 0.44–1.00)
GFR, Estimated: >60 mL/min (ref >60)
Glucose, Bld: 229 mg/dL — ABNORMAL HIGH (ref 70–99)
Potassium: 6 mmol/L — ABNORMAL HIGH (ref 3.5–5.1)
Sodium: 137 mmol/L (ref 135–145)

## 2022-06-04 MED ORDER — GUAIFENESIN ER 600 MG PO TB12
1200.0000 mg | ORAL_TABLET | Freq: Two times a day (BID) | ORAL | Status: DC
  Administered 2022-06-04 – 2022-06-05 (×3): 1200 mg via ORAL
  Filled 2022-06-04 (×3): qty 2

## 2022-06-04 MED ORDER — IPRATROPIUM-ALBUTEROL 0.5-2.5 (3) MG/3ML IN SOLN
3.0000 mL | Freq: Three times a day (TID) | RESPIRATORY_TRACT | Status: DC
  Administered 2022-06-04 (×2): 3 mL via RESPIRATORY_TRACT
  Filled 2022-06-04 (×2): qty 3

## 2022-06-04 MED ORDER — DOXYCYCLINE HYCLATE 100 MG PO TABS
100.0000 mg | ORAL_TABLET | Freq: Two times a day (BID) | ORAL | Status: DC
  Administered 2022-06-04 – 2022-06-05 (×3): 100 mg via ORAL
  Filled 2022-06-04 (×3): qty 1

## 2022-06-04 MED ORDER — ATORVASTATIN CALCIUM 40 MG PO TABS: 40.0000 mg | ORAL_TABLET | Freq: Every evening | ORAL | Status: DC

## 2022-06-04 MED ORDER — EZETIMIBE 10 MG PO TABS
10.0000 mg | ORAL_TABLET | Freq: Every day | ORAL | Status: DC
  Administered 2022-06-04 – 2022-06-05 (×2): 10 mg via ORAL
  Filled 2022-06-04 (×2): qty 1

## 2022-06-04 MED ORDER — ROPINIROLE HCL 0.25 MG PO TABS: 0.5000 mg | ORAL_TABLET | Freq: Two times a day (BID) | ORAL | Status: DC | PRN

## 2022-06-04 MED ORDER — ROPINIROLE HCL 1 MG PO TABS
1.5000 mg | ORAL_TABLET | Freq: Every day | ORAL | Status: DC
  Administered 2022-06-04: 1.5 mg via ORAL
  Filled 2022-06-04: qty 2

## 2022-06-04 MED ORDER — LACTATED RINGERS IV SOLN: INTRAVENOUS | Status: DC

## 2022-06-04 MED ORDER — INSULIN ASPART 100 UNIT/ML IJ SOLN
0.0000 [IU] | Freq: Three times a day (TID) | INTRAMUSCULAR | Status: DC
  Administered 2022-06-04 (×2): 3 [IU] via SUBCUTANEOUS
  Administered 2022-06-04: 5 [IU] via SUBCUTANEOUS
  Administered 2022-06-05: 7 [IU] via SUBCUTANEOUS
  Administered 2022-06-05: 3 [IU] via SUBCUTANEOUS

## 2022-06-04 MED ORDER — QUETIAPINE FUMARATE 100 MG PO TABS
200.0000 mg | ORAL_TABLET | Freq: Two times a day (BID) | ORAL | Status: DC
  Administered 2022-06-04: 200 mg via ORAL
  Filled 2022-06-04 (×2): qty 2

## 2022-06-04 MED ORDER — DICLOFENAC SODIUM 1 % EX GEL
4.0000 g | Freq: Four times a day (QID) | CUTANEOUS | Status: DC | PRN
  Filled 2022-06-04: qty 100

## 2022-06-04 MED ORDER — ENOXAPARIN SODIUM 40 MG/0.4ML IJ SOSY
40.0000 mg | PREFILLED_SYRINGE | INTRAMUSCULAR | Status: DC
  Administered 2022-06-04 – 2022-06-05 (×2): 40 mg via SUBCUTANEOUS
  Filled 2022-06-04 (×2): qty 0.4

## 2022-06-04 MED ORDER — INSULIN ASPART 100 UNIT/ML IJ SOLN
0.0000 [IU] | Freq: Every day | INTRAMUSCULAR | Status: DC
  Administered 2022-06-04: 3 [IU] via SUBCUTANEOUS

## 2022-06-04 MED ORDER — DEXAMETHASONE 4 MG PO TABS
6.0000 mg | ORAL_TABLET | ORAL | Status: DC
  Administered 2022-06-04 – 2022-06-05 (×2): 6 mg via ORAL
  Filled 2022-06-04 (×2): qty 1

## 2022-06-04 MED ORDER — SODIUM CHLORIDE 0.9% FLUSH
3.0000 mL | Freq: Two times a day (BID) | INTRAVENOUS | Status: DC
  Administered 2022-06-04 (×2): 3 mL via INTRAVENOUS

## 2022-06-04 MED ORDER — ALPRAZOLAM 0.5 MG PO TABS
0.5000 mg | ORAL_TABLET | Freq: Four times a day (QID) | ORAL | Status: DC | PRN
  Administered 2022-06-04 (×3): 0.5 mg via ORAL
  Filled 2022-06-04 (×3): qty 1

## 2022-06-04 MED ORDER — DICYCLOMINE HCL 20 MG PO TABS: 20.0000 mg | ORAL_TABLET | Freq: Three times a day (TID) | ORAL | Status: DC | PRN

## 2022-06-04 MED ORDER — ONDANSETRON HCL 4 MG PO TABS: 4.0000 mg | ORAL_TABLET | Freq: Four times a day (QID) | ORAL | Status: DC | PRN

## 2022-06-04 MED ORDER — LOPERAMIDE HCL 2 MG PO CAPS
2.0000 mg | ORAL_CAPSULE | Freq: Once | ORAL | Status: AC
  Administered 2022-06-04: 2 mg via ORAL
  Filled 2022-06-04: qty 1

## 2022-06-04 MED ORDER — CALCIUM CARBONATE ANTACID 500 MG PO CHEW
1000.0000 mg | CHEWABLE_TABLET | Freq: Every day | ORAL | Status: DC
  Administered 2022-06-04 – 2022-06-05 (×2): 1000 mg via ORAL
  Filled 2022-06-04 (×2): qty 5

## 2022-06-04 MED ORDER — SODIUM CHLORIDE 0.9 % IV SOLN: 1.0000 g | INTRAVENOUS | Status: DC

## 2022-06-04 MED ORDER — MEMANTINE HCL 10 MG PO TABS
5.0000 mg | ORAL_TABLET | Freq: Every day | ORAL | Status: DC
  Administered 2022-06-04: 5 mg via ORAL
  Filled 2022-06-04: qty 1

## 2022-06-04 MED ORDER — ROPINIROLE HCL 0.25 MG PO TABS: 0.5000 mg | ORAL_TABLET | ORAL | Status: DC

## 2022-06-04 MED ORDER — OXYCODONE-ACETAMINOPHEN 5-325 MG PO TABS: 1.0000 | ORAL_TABLET | Freq: Four times a day (QID) | ORAL | Status: DC | PRN

## 2022-06-04 MED ORDER — ROPINIROLE HCL 1 MG PO TABS: 2.0000 mg | ORAL_TABLET | ORAL | Status: DC

## 2022-06-04 MED ORDER — DONEPEZIL HCL 10 MG PO TABS
10.0000 mg | ORAL_TABLET | Freq: Every day | ORAL | Status: DC
  Administered 2022-06-04: 10 mg via ORAL
  Filled 2022-06-04: qty 1

## 2022-06-04 MED ORDER — ACETAMINOPHEN 650 MG RE SUPP: 650.0000 mg | Freq: Four times a day (QID) | RECTAL | Status: DC | PRN

## 2022-06-04 MED ORDER — GUAIFENESIN ER 600 MG PO TB12
600.0000 mg | ORAL_TABLET | Freq: Two times a day (BID) | ORAL | Status: DC
  Administered 2022-06-04: 600 mg via ORAL
  Filled 2022-06-04: qty 1

## 2022-06-04 MED ORDER — LITHIUM CARBONATE ER 450 MG PO TBCR
450.0000 mg | EXTENDED_RELEASE_TABLET | Freq: Every day | ORAL | Status: DC
  Administered 2022-06-04: 450 mg via ORAL
  Filled 2022-06-04 (×2): qty 1

## 2022-06-04 MED ORDER — CYCLOBENZAPRINE HCL 5 MG PO TABS: 5.0000 mg | ORAL_TABLET | Freq: Three times a day (TID) | ORAL | Status: DC | PRN

## 2022-06-04 MED ORDER — SODIUM CHLORIDE 0.9 % IV SOLN
2.0000 g | INTRAVENOUS | Status: DC
  Administered 2022-06-04: 2 g via INTRAVENOUS
  Filled 2022-06-04: qty 20

## 2022-06-04 MED ORDER — SODIUM CHLORIDE 0.9 % IV SOLN: INTRAVENOUS | Status: DC

## 2022-06-04 MED ORDER — LOSARTAN POTASSIUM 25 MG PO TABS
25.0000 mg | ORAL_TABLET | Freq: Every evening | ORAL | Status: DC
  Administered 2022-06-04: 25 mg via ORAL
  Filled 2022-06-04: qty 1

## 2022-06-04 MED ORDER — LORATADINE 10 MG PO TABS
10.0000 mg | ORAL_TABLET | Freq: Every day | ORAL | Status: DC
  Administered 2022-06-04 – 2022-06-05 (×2): 10 mg via ORAL
  Filled 2022-06-04 (×2): qty 1

## 2022-06-04 MED ORDER — GABAPENTIN 300 MG PO CAPS
600.0000 mg | ORAL_CAPSULE | Freq: Three times a day (TID) | ORAL | Status: DC
  Administered 2022-06-04 – 2022-06-05 (×3): 600 mg via ORAL
  Filled 2022-06-04 (×3): qty 2

## 2022-06-04 MED ORDER — IPRATROPIUM-ALBUTEROL 0.5-2.5 (3) MG/3ML IN SOLN
3.0000 mL | Freq: Three times a day (TID) | RESPIRATORY_TRACT | Status: DC
  Administered 2022-06-04 – 2022-06-05 (×3): 3 mL via RESPIRATORY_TRACT
  Filled 2022-06-04 (×3): qty 3

## 2022-06-04 MED ORDER — GUAIFENESIN-DM 100-10 MG/5ML PO SYRP: 5.0000 mL | ORAL_SOLUTION | ORAL | Status: DC | PRN

## 2022-06-04 MED ORDER — ACETAMINOPHEN 325 MG PO TABS
650.0000 mg | ORAL_TABLET | Freq: Four times a day (QID) | ORAL | Status: DC | PRN
  Administered 2022-06-04: 650 mg via ORAL
  Filled 2022-06-04: qty 2

## 2022-06-04 MED ORDER — SENNOSIDES-DOCUSATE SODIUM 8.6-50 MG PO TABS: 1.0000 | ORAL_TABLET | Freq: Every evening | ORAL | Status: DC | PRN

## 2022-06-04 MED ORDER — PANTOPRAZOLE SODIUM 40 MG PO TBEC
40.0000 mg | DELAYED_RELEASE_TABLET | Freq: Every day | ORAL | Status: DC
  Administered 2022-06-04 – 2022-06-05 (×2): 40 mg via ORAL
  Filled 2022-06-04 (×2): qty 1

## 2022-06-04 MED ORDER — ALBUTEROL SULFATE (2.5 MG/3ML) 0.083% IN NEBU: 2.5000 mg | INHALATION_SOLUTION | RESPIRATORY_TRACT | Status: DC | PRN

## 2022-06-04 MED ORDER — ONDANSETRON HCL 4 MG/2ML IJ SOLN: 4.0000 mg | Freq: Four times a day (QID) | INTRAMUSCULAR | Status: DC | PRN

## 2022-06-04 MED ORDER — LIOTHYRONINE SODIUM 5 MCG PO TABS
5.0000 ug | ORAL_TABLET | Freq: Every day | ORAL | Status: DC
  Administered 2022-06-04 – 2022-06-05 (×2): 5 ug via ORAL
  Filled 2022-06-04 (×2): qty 1

## 2022-06-04 MED ORDER — LEVOTHYROXINE SODIUM 75 MCG PO TABS
75.0000 ug | ORAL_TABLET | Freq: Every day | ORAL | Status: DC
  Administered 2022-06-04 – 2022-06-05 (×2): 75 ug via ORAL
  Filled 2022-06-04 (×2): qty 1

## 2022-06-04 NOTE — Hospital Course (Signed)
[name] [name] is a 77 y.o. female with medical history significant for insulin-dependent T2DM, HTN, HLD, hypothyroidism, bipolar disorder, RLS, anxiety who is admitted with acute hypoxic respiratory failure in setting of COVID-19 infection.

## 2022-06-04 NOTE — Progress Notes (Signed)
PROGRESS NOTE    [name] [name]  [id number] DOB: 1945/07/04 DOA: 06/03/2022 PCP: London Pepper, MD    Chief Complaint  Patient presents with   Cough    Brief Narrative:  Patient 77 year old female, history of insulin-dependent type 2 diabetes, hypertension, hyperlipidemia, hypothyroidism, bipolar disorder, RLS, anxiety who was admitted with acute hypoxic respiratory failure in the setting of COVID-19 infection.  Chest x-ray done with concerns for possible early multifocal pneumonia.  Patient admitted, placed on IV antibiotics, steroids, O2, supportive care.   Assessment & Plan:   Principal Problem:   Pneumonia due to COVID-19 virus Active Problems:   Acute respiratory failure with hypoxia ([name])   Insulin dependent type 2 diabetes mellitus ([place])   Hyperlipidemia associated with type 2 diabetes mellitus ([place])   Hypertension associated with diabetes ([place])   Mood disorder ([place])  #1 acute hypoxic respiratory failure in the setting of COVID-19 infection and possible multifocal pneumonia -Patient presenting with respiratory symptoms, recently diagnosed with COVID. -Chest x-ray with potential early bibasilar infiltrates. -Patient with a new hypoxia with sats as low as 86% on room air currently on 3 L with sats of 97%. -Patient noted to have been started on Paxlovid as outpatient 2 days prior to admission and has no significant improvement and potential interactions with several of her other chronic medications Paxlovid was held. -Continue empiric antibiotics of Rocephin and doxycycline to cover potential secondary bacterial pneumonia. -Continue Decadron, incentive spirometry, flutter valve, Mucinex. -Placed on scheduled DuoNebs.  2.  Hyperlipidemia -Hold statin. -May resume statin on discharge.  3.  Insulin-dependent type 2 diabetes mellitus -Patient noted to have some low blood glucose levels felt likely secondary to poor oral intake. -Patient currently on Decadron with  increased CBGs. -Continue SSI.  4.  Hypertension -Continue losartan.  5.  Hypothyroidism -Continue Cytomel, Synthroid.  6.  Bipolar disorder/depression/anxiety -Stable. -Continue home regimen lithium, Seroquel, Xanax. -Will need to verify home dose Depakote prior to starting.  7.  RLS -Resume home regimen Requip.   DVT prophylaxis: Lovenox Code Status: Full Family Communication: Updated patient and son at bedside. Disposition: Likely home when clinically improved, no longer hypoxic.  Status is: Inpatient Remains inpatient appropriate because: Severity of illness   Consultants:  None  Procedures:  Chest x-ray 06/03/2022   Antimicrobials:  Oral doxycycline 06/03/2022>>>> IV Rocephin 06/03/2022 >>>>   Subjective: States she does not feel too well today.  Denies any chest pain.  Feels slight improvement with shortness of breath since admission.  States cough is now nonproductive when he was productive before.  Complaining of generalized weakness.  Having multiple soft loose stools.  Denies watery stools.  Son at bedside.  Objective: Vitals:   06/04/22 0939 06/04/22 1210 06/04/22 1517 06/04/22 1936  BP:  (!) 143/75  (!) 144/71  Pulse:  87  87  Resp:  20  15  Temp:  97.8 F (36.6 C)  (!) 97.4 F (36.3 C)  TempSrc:  Oral  Oral  SpO2: 97% 93% 97% 96%  Weight:      Height:        Intake/Output Summary (Last 24 hours) at 06/04/2022 1938 Last data filed at 06/04/2022 1800 Gross per 24 hour  Intake 2401.79 ml  Output 2600 ml  Net -198.21 ml   Filed Weights   06/03/22 2005 06/04/22 0052  Weight: 83.9 kg 89.9 kg    Examination:  General exam: Appears calm and comfortable.  Dry mucous membranes. Respiratory system: Some coarse breath sounds in the  bases.  No wheezes, fair air movement.  Speaking in full sentences.   Cardiovascular system: S1 & S2 heard, RRR. No JVD, murmurs, rubs, gallops or clicks. No pedal edema. Gastrointestinal system: Abdomen is nondistended,  soft and nontender. No organomegaly or masses felt. Normal bowel sounds heard. Central nervous system: Alert and oriented. No focal neurological deficits. Extremities: Symmetric 5 x 5 power. Skin: No rashes, lesions or ulcers Psychiatry: Judgement and insight appear normal. Mood & affect appropriate.     Data Reviewed: I have personally reviewed following labs and imaging studies  CBC: Recent Labs  Lab 05/29/22 0927 06/03/22 2106 06/04/22 0537  WBC 8.9 5.2 5.9  NEUTROABS 6.5 2.7  --   HGB 13.4 13.3 13.0  HCT 41.9 42.2 42.4  MCV 91.1 91.9 94.0  PLT 201 172 0000000    Basic Metabolic Panel: Recent Labs  Lab 05/29/22 0927 06/03/22 2106 06/04/22 0754 06/04/22 1233  NA 137 141 137  --   K 3.9 4.1 6.0* 4.8  CL 106 108 108  --   CO2 24 26 21*  --   GLUCOSE 187* 103* 229*  --   BUN 22 10 10   --   CREATININE 0.94 0.81 0.65  --   CALCIUM 10.7* 10.3 9.6  --   MG  --  1.8  --   --     GFR: Estimated Creatinine Clearance: 61.7 mL/min (by C-G formula based on SCr of 0.65 mg/dL).  Liver Function Tests: Recent Labs  Lab 05/29/22 0927 06/03/22 2106  AST 15 19  ALT 17 22  ALKPHOS 78 78  BILITOT 0.5 0.3  PROT 6.6 6.6  ALBUMIN 4.2 3.9    CBG: Recent Labs  Lab 06/04/22 0007 06/04/22 0048 06/04/22 0746 06/04/22 1207 06/04/22 1620  GLUCAP 66* 158* 222* 255* 231*     Recent Results (from the past 240 hour(s))  Urine Culture (for pregnant, neutropenic or urologic patients or patients with an indwelling urinary catheter)     Status: None   Collection Time: 05/29/22  9:55 AM   Specimen: Urine, Clean Catch  Result Value Ref Range Status   Specimen Description   Final    URINE, CLEAN CATCH Performed at [facility], [address] [place], [place] [address]    Special Requests   Final    NONE Performed at [facility], [address], [place], [place] [address]    Culture   Final    NO GROWTH Performed at [facility], [place] [address], [place], [place] [address]    Report Status 05/30/2022 FINAL  Final  SARS Coronavirus 2 by RT PCR (hospital order, performed in [facility] hospital lab) *cepheid single result test* Anterior Nasal Swab     Status: Abnormal   Collection Time: 06/03/22 10:40 PM   Specimen: Anterior Nasal Swab  Result Value Ref Range Status   SARS Coronavirus 2 by RT PCR POSITIVE (A) NEGATIVE Final    Comment: (NOTE) SARS-CoV-2 target nucleic acids are DETECTED  SARS-CoV-2 RNA is generally detectable in upper respiratory specimens  during the acute phase of infection.  Positive results are indicative  of the presence of the identified virus, but do not rule out bacterial infection or co-infection with other pathogens not detected by the test.  Clinical correlation with patient history and  other diagnostic information is necessary to determine patient infection status.  The expected result is negative.  Fact Sheet for Patients:   https://www.patel.info/   Fact  Sheet for Healthcare Providers:   https://hall.com/    This test is not yet approved or cleared by the [place] and  has been authorized for detection and/or diagnosis of SARS-CoV-2 by FDA under an Emergency Use Authorization (EUA).  This EUA will remain in effect (meaning this test can be used) for the duration of  the COVID-19 declaration under Section 564(b)(1)  of the Act, 21 U.S.C. section 360-bbb-3(b)(1), unless the authorization is terminated or revoked sooner.   Performed at [facility], [address] [place], [place] [address]          Radiology Studies: DG Chest 2 View  Result Date: 06/03/2022 CLINICAL DATA:  Evaluation for pneumonia. COVID positive. Productive cough on Monday. Now with worsening cough, poor appetite, body aches, and nausea. EXAM: CHEST - 2 VIEW COMPARISON:  03/29/2022 FINDINGS: Slightly shallow inspiration. Heart size  and pulmonary vascularity are normal. Suggestion of mild patchy infiltration in the lung bases which may indicate early multifocal pneumonia. No pleural effusions. No pneumothorax. Mediastinal contours appear intact. Calcified and tortuous aorta. Thoracolumbar scoliosis convex towards the left. Degenerative changes in the spine. IMPRESSION: Suggestion of patchy infiltration in the lung bases possibly indicating early multifocal pneumonia. Electronically Signed   By: Lucienne Capers M.D.   On: 06/03/2022 21:23        Scheduled Meds:  calcium carbonate  1 tablet Oral Daily   dexamethasone  6 mg Oral Q24H   donepezil  10 mg Oral QHS   doxycycline  100 mg Oral Q12H   enoxaparin (LOVENOX) injection  40 mg Subcutaneous Q24H   ezetimibe  10 mg Oral Daily   gabapentin  600 mg Oral See admin instructions   guaiFENesin  1,200 mg Oral BID   insulin aspart  0-5 Units Subcutaneous QHS   insulin aspart  0-9 Units Subcutaneous TID WC   ipratropium-albuterol  3 mL Nebulization TID   levothyroxine  75 mcg Oral QAC breakfast   liothyronine  5 mcg Oral QAC breakfast   lithium carbonate  450 mg Oral QHS   loratadine  10 mg Oral Daily   losartan  25 mg Oral QPM   memantine  5 mg Oral See admin instructions   pantoprazole  40 mg Oral Q0600   QUEtiapine  200 mg Oral BID   rOPINIRole  0.5-2 mg Oral See admin instructions   sodium chloride flush  3 mL Intravenous Q12H   Continuous Infusions:  sodium chloride 100 mL/hr at 06/04/22 1330   cefTRIAXone (ROCEPHIN)  IV       LOS: 1 day    Time spent: 40 minutes    Irine Seal, MD [facility]   To contact the attending provider between 7A-7P or the covering provider during after hours 7P-7A, please log into the web site [email or web address] and access using universal [place] password for that web site. If you do not have the password, please call the hospital operator.  06/04/2022, 7:38 PM

## 2022-06-04 NOTE — Plan of Care (Signed)
  Problem: Education: Goal: Knowledge of General Education information will improve Description: Including pain rating scale, medication(s)/side effects and non-pharmacologic comfort measures Outcome: Progressing   Problem: Health Behavior/Discharge Planning: Goal: Ability to manage health-related needs will improve Outcome: Progressing   Problem: Clinical Measurements: Goal: Ability to maintain clinical measurements within normal limits will improve Outcome: Progressing Goal: Will remain free from infection Outcome: Progressing Goal: Diagnostic test results will improve Outcome: Progressing Goal: Respiratory complications will improve Outcome: Progressing Goal: Cardiovascular complication will be avoided Outcome: Progressing   Problem: Activity: Goal: Risk for activity intolerance will decrease Outcome: Progressing   Problem: Nutrition: Goal: Adequate nutrition will be maintained Outcome: Progressing   Problem: Coping: Goal: Level of anxiety will decrease Outcome: Progressing   Problem: Elimination: Goal: Will not experience complications related to bowel motility Outcome: Progressing Goal: Will not experience complications related to urinary retention Outcome: Progressing   Problem: Pain Managment: Goal: General experience of comfort will improve Outcome: Progressing   Problem: Safety: Goal: Ability to remain free from injury will improve Outcome: Progressing   Problem: Skin Integrity: Goal: Risk for impaired skin integrity will decrease Outcome: Progressing   Problem: Education: Goal: Knowledge of risk factors and measures for prevention of condition will improve Outcome: Progressing   Problem: Coping: Goal: Psychosocial and spiritual needs will be supported Outcome: Progressing   Problem: Respiratory: Goal: Will maintain a patent airway Outcome: Progressing Goal: Complications related to the disease process, condition or treatment will be avoided or  minimized Outcome: Progressing   Problem: Education: Goal: Ability to describe self-care measures that may prevent or decrease complications (Diabetes Survival Skills Education) will improve Outcome: Progressing Goal: Individualized Educational Video(s) Outcome: Progressing   Problem: Coping: Goal: Ability to adjust to condition or change in health will improve Outcome: Progressing   Problem: Fluid Volume: Goal: Ability to maintain a balanced intake and output will improve Outcome: Progressing   Problem: Health Behavior/Discharge Planning: Goal: Ability to identify and utilize available resources and services will improve Outcome: Progressing Goal: Ability to manage health-related needs will improve Outcome: Progressing   Problem: Metabolic: Goal: Ability to maintain appropriate glucose levels will improve Outcome: Progressing   Problem: Nutritional: Goal: Maintenance of adequate nutrition will improve Outcome: Progressing Goal: Progress toward achieving an optimal weight will improve Outcome: Progressing   Problem: Skin Integrity: Goal: Risk for impaired skin integrity will decrease Outcome: Progressing   Problem: Tissue Perfusion: Goal: Adequacy of tissue perfusion will improve Outcome: Progressing   

## 2022-06-04 NOTE — H&P (Signed)
History and Physical    [name] [name] [id number] DOB: 07-24-1945 DOA: 06/03/2022  PCP: London Pepper, MD  Patient coming from: Home  I have personally briefly reviewed patient's old medical records in [place]  Chief Complaint: Fatigue, cough, COVID infection  HPI: [name] [name] is a 77 y.o. female with medical history significant for insulin-dependent T2DM, HTN, HLD, hypothyroidism, bipolar disorder, RLS, anxiety who presented to the ED for evaluation of fatigue, cough congestion in setting of recent positive COVID-19 test.  Patient states 1 week ago she was diagnosed with a UTI and she was started on nitrofurantoin.  2 days later (3/22) she was seen in the ED with a petechial rash on both her lower extremities from the ankles to about halfway up to the knee (pictures in chart).  Nitrofurantoin was changed to Keflex and she was given a dose of dexamethasone in the ED.  Rash itself is improving.  6 days ago she began to have cough productive of yellow sputum, dyspnea, generalized fatigue, and chest congestion.  She says she tested positive for COVID-19 as an outpatient and she was started on a 5-day course of Paxlovid.  She has been having worsening fatigue and shortness of breath therefore came to the ED for further evaluation.  She denies any fevers, chills, diaphoresis, nausea, vomiting, abdominal pain.  [facility] ED Course  Labs/Imaging on admission: I have personally reviewed following labs and imaging studies.  Initial vitals showed BP 153/73, pulse 89, RR 20, temp 98.1 F, SpO2 94% on room air.  SpO2 subsequently dropped to 86-88% while on room air and patient was placed on 2 L O2 via [place] to maintain O2 >94%.  Labs show WBC 5.2, hemoglobin 13.3, platelets 172,000, sodium 141, potassium 4.1, bicarb 26, BUN 10, creatinine 0.81, serum glucose 103, LFTs within normal limits.  SARS-CoV-2 PCR is positive.  2 view chest x-ray showed patchy infiltration in  the lung bases.  Patient was given 1 L normal saline, IV ceftriaxone and doxycycline.  The hospitalist service was consulted to admit for further evaluation and management.  Review of Systems: All systems reviewed and are negative except as documented in history of present illness above.   Past Medical History:  Diagnosis Date   Alcohol abuse    Anxiety    takes Valium daily as needed and Ativan daily   Arthritis    Bilateral hearing loss    Bipolar 1 disorder ([name])    takes Lithium nightly and Synthroid daily   CFS (chronic fatigue syndrome)    Chewing difficulty    Chronic back pain    DDD; "all over" (09/14/2017)   Colitis, ischemic ([place]) 2012   Confusion    r/t meds   Constipation    Depression    takes Prozac daily and Bupspirone    Diabetes ([place])    Diverticulosis    Dyslipidemia    takes Crestor daily   Edema of both lower extremities    Fibromyalgia    Gastroparesis    Headache    "weekly" (09/14/2017)   Hepatic steatosis 06/18/2012   severe   Hyperlipidemia    Hypertension    Hypothyroidism    IBS (irritable bowel syndrome)    Ischemic colitis ([place])    Joint pain    Joint swelling    Lupus erythematosus tumidus    tumid-skin   Migraine    "1-2/yr; maybe" (09/14/2017)   Numbness    in right foot   Osteoarthritis    "  all over" (09/14/2017)   Osteoarthritis cervical spine    Osteoarthritis of hand    bilateral   Pneumonia    "walking pneumonia several times; long time since the last time" (09/14/2017)   Restless leg syndrome    takes Requip nightly   Sciatica    Swallowing difficulty    Type II diabetes mellitus ([place])    Urinary frequency    Urinary leakage    Urinary urgency    Urinary, incontinence, stress female    Walking pneumonia    last time more than 78yrs ago    Past Surgical History:  Procedure Laterality Date   ABDOMINAL HYSTERECTOMY     "they left my ovaries"   APPENDECTOMY     BALLOON DILATION N/A 06/14/2020   Procedure: BALLOON  DILATION;  Surgeon: Ronnette Juniper, MD;  Location: [facility];  Service: Gastroenterology;  Laterality: N/A;   BIOPSY  06/14/2020   Procedure: BIOPSY;  Surgeon: Ronnette Juniper, MD;  Location: [facility];  Service: Gastroenterology;;   CARDIAC CATHETERIZATION  09/14/2017   COLON RESECTION  05/2021   COLONOSCOPY     DENTAL SURGERY Left 10/2016   dental implant   DILATION AND CURETTAGE OF UTERUS  X 4   ESOPHAGOGASTRODUODENOSCOPY     ESOPHAGOGASTRODUODENOSCOPY (EGD) WITH PROPOFOL N/A 06/14/2020   Procedure: ESOPHAGOGASTRODUODENOSCOPY (EGD) WITH PROPOFOL;  Surgeon: Ronnette Juniper, MD;  Location: [facility];  Service: Gastroenterology;  Laterality: N/A;   FLEXIBLE SIGMOIDOSCOPY N/A 06/21/2012   Procedure: FLEXIBLE SIGMOIDOSCOPY;  Surgeon: Jerene Bears, MD;  Location: [facility];  Service: Gastroenterology;  Laterality: N/A;   JOINT REPLACEMENT     LEFT HEART CATH AND CORONARY ANGIOGRAPHY N/A 09/14/2017   Procedure: LEFT HEART CATH AND CORONARY ANGIOGRAPHY;  Surgeon: Belva Crome, MD;  Location: [facility];  Service: Cardiovascular;  Laterality: N/A;   SHOULDER ARTHROSCOPY Right    "shaved spurs off rotator cuff"   TONSILLECTOMY     TOTAL HIP ARTHROPLASTY Right 06/09/2013   Procedure: TOTAL HIP ARTHROPLASTY;  Surgeon: Kerin Salen, MD;  Location: [facility];  Service: Orthopedics;  Laterality: Right;   TOTAL KNEE ARTHROPLASTY Left 02/07/2019   Procedure: TOTAL KNEE ARTHROPLASTY;  Surgeon: Paralee Cancel, MD;  Location: [facility];  Service: Orthopedics;  Laterality: Left;  70 mins   TUBAL LIGATION     TUMOR EXCISION Right 1968   angle of jaw; benign    Social History:  reports that she has never smoked. She has never used smokeless tobacco. She reports that she does not currently use alcohol. She reports that she does not currently use drugs after having used the following drugs: Benzodiazepines. Frequency: 7.00 times per week.  Allergies  Allergen Reactions   Codeine Anxiety and Other  (See Comments)    Hallucinations, tolerates oxycodone    Procaine Hcl Palpitations   Epinephrine     Other reaction(s): Other (See Comments) Makes heart race at the dentist Makes heart race at the dentist    Metformin And Related Nausea Only   Ozempic (0.25 Or 0.5 Mg-Dose) [Semaglutide(0.25 Or 0.5mg -Dos)] Nausea Only   Aspirin Nausea And Vomiting and Other (See Comments)    Reaction:  Burns pts stomach    Augmentin [Amoxicillin-Pot Clavulanate] Diarrhea   Benadryl [Diphenhydramine] Other (See Comments)    Per MD "inhibits potency of gabapentin, lithium etc"   Diflucan [Fluconazole] Other (See Comments)    Unknown reaction    Dilaudid [Hydromorphone Hcl] Other (See Comments)    Migraines and nightmares  Hydromorphone Other (See Comments)   Morphine Other (See Comments)    headache   Other Nausea And Vomiting and Other (See Comments)    Pt states that all -mycins cause N/V. (MACROLIDES)    Prednisone     Long term steroid problems with lithium and blood sugar.    Tramadol Hcl     Other reaction(s): felt weird   Sulfa Antibiotics Other (See Comments)    Unknown reaction    Family History  Problem Relation Age of Onset   Drug abuse Mother    Alcohol abuse Mother    High Cholesterol Mother    Depression Mother    Anxiety disorder Mother    Bipolar disorder Mother    Obesity Mother    Alcohol abuse Father    Hypertension Father    High Cholesterol Father    CAD Brother    Hypertension Brother    Alcohol abuse Brother    Hypertension Brother    Hypertension Brother    Psoriasis Daughter    Arthritis Daughter        psoriatic arthritis    Alcohol abuse Grandchild      Prior to Admission medications   Medication Sig Start Date End Date Taking? Authorizing Provider  acetaminophen (TYLENOL) 325 MG tablet Take 2 tablets (650 mg total) by mouth every 6 (six) hours as needed. 02/11/21   Jeanell Sparrow, DO  ALPRAZolam Duanne Moron) 0.5 MG tablet Take 1 tablet (0.5 mg total) by  mouth 4 (four) times daily as needed for anxiety. 02/18/22   [name], PMHNP  atorvastatin (LIPITOR) 40 MG tablet Take 40 mg by mouth every evening.    [provider]  b complex vitamins capsule Take 1 capsule by mouth daily.    [provider]  BD PEN NEEDLE NANO 2ND GEN 32G X 4 MM MISC USE TO INJECT 5 TIMES DAILY AS DIRECTED 03/16/22   Elayne Snare, MD  benzonatate (TESSALON) 100 MG capsule Take by mouth. 03/07/22   [provider]  bismuth subsalicylate (PEPTO BISMOL) 262 MG/15ML suspension Take 30 mLs by mouth every 6 (six) hours as needed.    [provider]  calcium carbonate (TUMS EX) 750 MG chewable tablet Chew 1 tablet by mouth daily.    [provider]  cephALEXin (KEFLEX) 500 MG capsule Take 1 capsule (500 mg total) by mouth 3 (three) times daily. 05/29/22   Rancour, Annie Main, MD  cholecalciferol (VITAMIN D3) 25 MCG (1000 UNIT) tablet Take 3,000 Units by mouth daily. Also takes 2,000 units capsule at bedtime    [provider]  CONTOUR NEXT TEST test strip USE TO TEST BLOOD SUGAR TWICE DAILY 08/01/21   Elayne Snare, MD  cyclobenzaprine (FLEXERIL) 5 MG tablet Take 5 mg by mouth 3 (three) times daily as needed for muscle spasms.    [provider]  diclofenac (VOLTAREN) 75 MG EC tablet 1 tablet Orally Twice a day for 30 days 05/11/22   [provider]  diclofenac Sodium (VOLTAREN) 1 % GEL Apply 4 g topically 4 (four) times daily. 05/21/22   Palumbo, April, MD  dicyclomine (BENTYL) 20 MG tablet Take 1 tablet (20 mg total) by mouth 3 (three) times daily as needed for spasms. 06/17/21   Shelly Coss, MD  diphenhydrAMINE-PE-APAP [facility] SEVERE COLD NIGHTTIME PO) Take by mouth.    [provider]  divalproex (DEPAKOTE) 250 MG DR tablet Take 1 tablet (250 mg total) by mouth 2 (two) times daily AND 3 tablets (  750 mg total) at bedtime. Patient not taking: Reported on 05/06/2022 03/16/22 05/15/22  [name], PMHNP   donepezil (ARICEPT) 10 MG tablet Take 1/2 tablet daily 05/18/22   [name], PMHNP  ezetimibe (ZETIA) 10 MG tablet Take 1 tablet (10 mg total) by mouth daily. 10/07/21   Elayne Snare, MD  fluticasone Asencion Islam) 50 MCG/ACT nasal spray Place into both nostrils as needed for allergies or rhinitis.    [provider]  gabapentin (NEURONTIN) 600 MG tablet Take 600 mg by mouth 3 (three) times daily. 05/12/19   [provider]  ibuprofen (ADVIL) 800 MG tablet Take 800 mg by mouth every 8 (eight) hours as needed for moderate pain (following dental implants).    [provider]  insulin glargine (LANTUS SOLOSTAR) 100 UNIT/ML Solostar Pen ADMINISTER 66 UNITS UNDER THE SKIN AT BEDTIME Patient taking differently: 70 Units daily. ADMINISTER 66 UNITS UNDER THE SKIN AT BEDTIME 04/21/22   Elayne Snare, MD  insulin lispro (HUMALOG KWIKPEN) 100 UNIT/ML KwikPen ADMINISTER 12 UNITS UNDER THE SKIN BEFORE MEALS 03/13/22   Elayne Snare, MD  levothyroxine (SYNTHROID) 75 MCG tablet Take 1 tablet (75 mcg total) by mouth daily before breakfast. 03/26/22   Elayne Snare, MD  liothyronine (CYTOMEL) 5 MCG tablet TAKE 1 TABLET BY MOUTH DAILY WITH LEVOTHYROXINE  TAKE BEFORE BREAKFAST 03/26/22   Elayne Snare, MD  lithium carbonate (ESKALITH) 450 MG ER tablet Take 1 tablet (450 mg total) by mouth at bedtime. 02/26/22   [name], PMHNP  loperamide (IMODIUM) 2 MG capsule Take 2 capsules (4 mg total) by mouth 3 (three) times daily as needed for diarrhea or loose stools. 11/12/21   Danford, Suann Larry, MD  loratadine (CLARITIN) 10 MG tablet Take 10 mg by mouth daily as needed for allergies.    [provider]  losartan (COZAAR) 25 MG tablet Take 25 mg by mouth every evening. 08/31/19   [provider]  memantine (NAMENDA) 10 MG tablet TAKE 1/2 TABLET BY MOUTH EVERY EVENING FOR ONE WEEK, THEN STOP 05/18/22   [name], PMHNP  Microlet Lancets MISC TEST DAILY AS DIRECTED 08/01/21   Elayne Snare,  MD  nitroGLYCERIN (NITROSTAT) 0.4 MG SL tablet Place 1 tablet (0.4 mg total) under the tongue every 5 (five) minutes as needed for chest pain. 09/14/17   Lyda Jester M, PA-C  ondansetron (ZOFRAN) 4 MG tablet Take 1 tablet (4 mg total) by mouth every 6 (six) hours. 09/20/21   Curatolo, Adam, DO  oxyCODONE-acetaminophen (PERCOCET/ROXICET) 5-325 MG tablet Take 1-2 tablets by mouth every 6 (six) hours as needed for severe pain. 03/31/22   Regan Lemming, MD  PEG 3350-KCl-NaBcb-NaCl-NaSulf (PEG-3350/ELECTROLYTES) 236 g SOLR Take 17 g by mouth daily as needed. 10/06/21   [provider]  QUEtiapine (SEROQUEL) 200 MG tablet TAKE 1 TAB PO q 5 pm and 1 tab po QHS 05/18/22   [name], PMHNP  rOPINIRole (REQUIP) 0.5 MG tablet Takes 4 tabs at 5 pm and 3 tabs at bedtime and 1-2 tabs prn 05/18/22   [name], PMHNP  Wheat Dextrin (BENEFIBER PO) Take 1 Dose by mouth daily.    [provider]    Physical Exam: Vitals:   06/03/22 2034 06/03/22 2300 06/03/22 2333 06/04/22 0052  BP:  136/67 138/66   Pulse:  72 72   Resp:  18 (!) 22   Temp:   97.7 F (36.5 C)   TempSrc:   Oral   SpO2: 92% 93% 100%   Weight:  89.9 kg  Height:    5' (1.524 m)   Constitutional: Elderly woman resting in bed, appears fatigued but in NAD, calm, comfortable Eyes: EOMI, lids and conjunctivae normal ENMT: Mucous membranes are moist. Posterior pharynx clear of any exudate or lesions.Normal dentition.  Neck: normal, supple, no masses. Respiratory: clear to auscultation bilaterally, no wheezing, no crackles. Normal respiratory effort while on 3 L O2 via [place]. No accessory muscle use.  Cardiovascular: Regular rate and rhythm, no murmurs / rubs / gallops. No extremity edema. 2+ pedal pulses. Abdomen: no tenderness, no masses palpated.  Musculoskeletal: no clubbing / cyanosis. No joint deformity upper and lower extremities. Good ROM, no contractures. Normal muscle tone.  Skin: Faint petechial rash  bilateral distal lower extremities, appearance improved when compared to photos from 3/22 Neurologic: CN 2-12 grossly intact. Sensation intact. Strength equal bilaterally Psychiatric: Alert and oriented x 3. Normal mood.   EKG: Not performed. Assessment/Plan Principal Problem:   Pneumonia due to COVID-19 virus Active Problems:   Acute respiratory failure with hypoxia ([name])   Insulin dependent type 2 diabetes mellitus ([place])   Hyperlipidemia associated with type 2 diabetes mellitus ([place])   Hypertension associated with diabetes ([place])   Mood disorder ([place])   [name] [name] is a 77 y.o. female with medical history significant for insulin-dependent T2DM, HTN, HLD, hypothyroidism, bipolar disorder, RLS, anxiety who is admitted with acute hypoxic respiratory failure in setting of COVID-19 infection.  Assessment and Plan: Acute hypoxic respiratory failure in setting of COVID-19 infection and possible multifocal pneumonia: COVID-positive with CXR suggestive of potential early basilar infiltrates.  She has new hypoxia with SpO2 as low as 86% on room air, stable on 3 L via [place] on admission.  She has been started on empiric antibiotics due to potential secondary bacterial pneumonia.  She was started on Paxlovid as an outpatient, will hold going forward due to no improvement and potential interaction with several of her chronic medications. -Continue ceftriaxone and doxycycline -Start on oral Decadron 6 mg daily -Continue supplemental O2 and wean as able -Incentive spirometer, flutter valve, Mucinex  Insulin-dependent type 2 diabetes: She has had some low blood sugar on arrival likely due to poor oral intake.  Will place on SSI for now, adjust further as oral intake improves.  Hypertension: Continue losartan.  Hyperlipidemia: Continue atorvastatin and Zetia.  Hypothyroidism: Continue Synthroid and Cytomel.  Bipolar disorder/depression/anxiety: Mood is currently stable.  Will resume her home  lithium, Seroquel, Xanax.  She also appears to be taking Depakote but will need to verify the dosing.   DVT prophylaxis: enoxaparin (LOVENOX) injection 40 mg Start: 06/04/22 1000 Code Status: Full code, discussed with patient on admission Family Communication: Discussed with patient, she has discussed with family Disposition Plan: From home, dispo pending clinical progress Consults called: None Severity of Illness: The appropriate patient status for this patient is INPATIENT. Inpatient status is judged to be reasonable and necessary in order to provide the required intensity of service to ensure the patient's safety. The patient's presenting symptoms, physical exam findings, and initial radiographic and laboratory data in the context of their chronic comorbidities is felt to place them at high risk for further clinical deterioration. Furthermore, it is not anticipated that the patient will be medically stable for discharge from the hospital within 2 midnights of admission.   * I certify that at the point of admission it is my clinical judgment that the patient will require inpatient hospital care spanning beyond 2 midnights from the point  of admission due to high intensity of service, high risk for further deterioration and high frequency of surveillance required.Zada Finders MD [facility]  If 7PM-7AM, please contact night-coverage [email or web address]  06/04/2022, 1:43 AM

## 2022-06-04 NOTE — Progress Notes (Addendum)
Hypoglycemic Event  CBG: 56  Treatment: 4 oz juice/soda  Symptoms: Hungry  Follow-up CBG: Time: CBG Result:158  Possible Reasons for Event: Inadequate meal intake  Comments/MD notified: Charge nurse on duty made aware    [name] [name]

## 2022-06-05 DIAGNOSIS — E119 Type 2 diabetes mellitus without complications: Secondary | ICD-10-CM | POA: Diagnosis not present

## 2022-06-05 DIAGNOSIS — U071 COVID-19: Principal | ICD-10-CM

## 2022-06-05 DIAGNOSIS — J189 Pneumonia, unspecified organism: Secondary | ICD-10-CM

## 2022-06-05 DIAGNOSIS — E1169 Type 2 diabetes mellitus with other specified complication: Secondary | ICD-10-CM | POA: Diagnosis not present

## 2022-06-05 DIAGNOSIS — J9601 Acute respiratory failure with hypoxia: Secondary | ICD-10-CM | POA: Diagnosis not present

## 2022-06-05 LAB — GLUCOSE, CAPILLARY
Glucose-Capillary: 250 mg/dL — ABNORMAL HIGH (ref 70–99)
Glucose-Capillary: 314 mg/dL — ABNORMAL HIGH (ref 70–99)

## 2022-06-05 LAB — CBC WITH DIFFERENTIAL/PLATELET
Abs Immature Granulocytes: 0.08 10*3/uL — ABNORMAL HIGH (ref 0.00–0.07)
Basophils Absolute: 0 10*3/uL (ref 0.0–0.1)
Basophils Relative: 0 %
Eosinophils Absolute: 0 10*3/uL (ref 0.0–0.5)
Eosinophils Relative: 0 %
HCT: 42.9 % (ref 36.0–46.0)
Hemoglobin: 13.2 g/dL (ref 12.0–15.0)
Immature Granulocytes: 1 %
Lymphocytes Relative: 10 %
Lymphs Abs: 0.7 10*3/uL (ref 0.7–4.0)
MCH: 28.6 pg (ref 26.0–34.0)
MCHC: 30.8 g/dL (ref 30.0–36.0)
MCV: 93.1 fL (ref 80.0–100.0)
Monocytes Absolute: 0.2 10*3/uL (ref 0.1–1.0)
Monocytes Relative: 4 %
Neutro Abs: 5.4 10*3/uL (ref 1.7–7.7)
Neutrophils Relative %: 85 %
Platelets: 169 10*3/uL (ref 150–400)
RBC: 4.61 MIL/uL (ref 3.87–5.11)
RDW: 13.5 % (ref 11.5–15.5)
WBC: 6.4 10*3/uL (ref 4.0–10.5)
nRBC: 0 % (ref 0.0–0.2)

## 2022-06-05 LAB — PROCALCITONIN: Procalcitonin: <0.1 ng/mL

## 2022-06-05 LAB — BASIC METABOLIC PANEL
Anion gap: 7 (ref 5–15)
BUN: 13 mg/dL (ref 8–23)
CO2: 25 mmol/L (ref 22–32)
Calcium: 10.1 mg/dL (ref 8.9–10.3)
Chloride: 109 mmol/L (ref 98–111)
Creatinine, Ser: 0.86 mg/dL (ref 0.44–1.00)
GFR, Estimated: >60 mL/min (ref >60)
Glucose, Bld: 275 mg/dL — ABNORMAL HIGH (ref 70–99)
Potassium: 4.5 mmol/L (ref 3.5–5.1)
Sodium: 141 mmol/L (ref 135–145)

## 2022-06-05 LAB — C-REACTIVE PROTEIN: CRP: 0.6 mg/dL (ref <1.0)

## 2022-06-05 MED ORDER — DOXYCYCLINE HYCLATE 100 MG PO TABS: 100.0000 mg | ORAL_TABLET | Freq: Two times a day (BID) | ORAL | 0 refills | Status: AC

## 2022-06-05 MED ORDER — GUAIFENESIN-DM 100-10 MG/5ML PO SYRP: 5.0000 mL | ORAL_SOLUTION | ORAL | 0 refills | Status: DC | PRN

## 2022-06-05 MED ORDER — PANTOPRAZOLE SODIUM 40 MG PO TBEC: 40.0000 mg | DELAYED_RELEASE_TABLET | Freq: Every day | ORAL | 0 refills | Status: AC

## 2022-06-05 MED ORDER — INSULIN GLARGINE-YFGN 100 UNIT/ML ~~LOC~~ SOLN
20.0000 [IU] | Freq: Every day | SUBCUTANEOUS | Status: DC
  Filled 2022-06-05: qty 0.2

## 2022-06-05 MED ORDER — CEFDINIR 300 MG PO CAPS
300.0000 mg | ORAL_CAPSULE | Freq: Two times a day (BID) | ORAL | Status: DC
  Filled 2022-06-05: qty 1

## 2022-06-05 MED ORDER — COMBIVENT RESPIMAT 20-100 MCG/ACT IN AERS: INHALATION_SPRAY | RESPIRATORY_TRACT | 0 refills | Status: AC

## 2022-06-05 MED ORDER — INSULIN ASPART 100 UNIT/ML IJ SOLN
6.0000 [IU] | Freq: Three times a day (TID) | INTRAMUSCULAR | Status: DC
  Administered 2022-06-05: 6 [IU] via SUBCUTANEOUS

## 2022-06-05 MED ORDER — GUAIFENESIN ER 600 MG PO TB12: 1200.0000 mg | ORAL_TABLET | Freq: Two times a day (BID) | ORAL | 0 refills | Status: AC

## 2022-06-05 MED ORDER — DEXAMETHASONE 6 MG PO TABS: 6.0000 mg | ORAL_TABLET | ORAL | 0 refills | Status: AC

## 2022-06-05 MED ORDER — EZETIMIBE 10 MG PO TABS: 10.0000 mg | ORAL_TABLET | Freq: Every evening | ORAL | Status: AC

## 2022-06-05 MED ORDER — CEFDINIR 300 MG PO CAPS
300.0000 mg | ORAL_CAPSULE | Freq: Two times a day (BID) | ORAL | Status: DC
  Administered 2022-06-05: 300 mg via ORAL
  Filled 2022-06-05 (×2): qty 1

## 2022-06-05 MED ORDER — ATORVASTATIN CALCIUM 40 MG PO TABS: 40.0000 mg | ORAL_TABLET | ORAL | 0 refills | Status: AC

## 2022-06-05 MED ORDER — CEFDINIR 300 MG PO CAPS: 300.0000 mg | ORAL_CAPSULE | Freq: Two times a day (BID) | ORAL | 0 refills | Status: AC

## 2022-06-05 NOTE — Discharge Summary (Signed)
Physician Discharge Summary  [name] [name] [id number] DOB: 25-Dec-1945 DOA: 06/03/2022  PCP: London Pepper, MD  Admit date: 06/03/2022 Discharge date: 06/05/2022  Time spent: 60 minutes  Recommendations for Outpatient Follow-up:  Follow-up with London Pepper, MD in 2 weeks.  On follow-up patient will need a basic metabolic profile to follow-up on electrolytes and renal function.   Discharge Diagnoses:  Principal Problem:   Pneumonia due to COVID-19 virus Active Problems:   Acute respiratory failure with hypoxia ([name])   Insulin dependent type 2 diabetes mellitus ([place])   Hyperlipidemia associated with type 2 diabetes mellitus ([place])   Hypertension associated with diabetes ([place])   Mood disorder ([place])   Community acquired pneumonia   COVID-19   Discharge Condition: Stable and improved.  Diet recommendation: Carb modified diet  Filed Weights   06/03/22 2005 06/04/22 0052  Weight: 83.9 kg 89.9 kg    History of present illness:  HPI per Dr. Jannet Askew is a 77 y.o. female with medical history significant for insulin-dependent T2DM, HTN, HLD, hypothyroidism, bipolar disorder, RLS, anxiety who presented to the ED for evaluation of fatigue, cough congestion in setting of recent positive COVID-19 test.   Patient states 1 week ago she was diagnosed with a UTI and she was started on nitrofurantoin.  2 days later (3/22) she was seen in the ED with a petechial rash on both her lower extremities from the ankles to about halfway up to the knee (pictures in chart).  Nitrofurantoin was changed to Keflex and she was given a dose of dexamethasone in the ED.  Rash itself is improving.   6 days ago she began to have cough productive of yellow sputum, dyspnea, generalized fatigue, and chest congestion.  She says she tested positive for COVID-19 as an outpatient and she was started on a 5-day course of Paxlovid.  She has been having worsening fatigue and shortness of breath therefore  came to the ED for further evaluation.   She denies any fevers, chills, diaphoresis, nausea, vomiting, abdominal pain.   [place] ED Course  Labs/Imaging on admission: I have personally reviewed following labs and imaging studies.   Initial vitals showed BP 153/73, pulse 89, RR 20, temp 98.1 F, SpO2 94% on room air.  SpO2 subsequently dropped to 86-88% while on room air and patient was placed on 2 L O2 via [place] to maintain O2 >94%.   Labs show WBC 5.2, hemoglobin 13.3, platelets 172,000, sodium 141, potassium 4.1, bicarb 26, BUN 10, creatinine 0.81, serum glucose 103, LFTs within normal limits.   SARS-CoV-2 PCR is positive.   2 view chest x-ray showed patchy infiltration in the lung bases.   Patient was given 1 L normal saline, IV ceftriaxone and doxycycline.  The hospitalist service was consulted to admit for further evaluation and management.  Hospital Course:  #1 acute hypoxic respiratory failure in the setting of COVID-19 infection and possible multifocal pneumonia -Patient presented with respiratory symptoms, recently diagnosed with COVID. -Chest x-ray with potential early bibasilar infiltrates. -Patient with a new hypoxia with sats as low as 86% on room air on presentation and required 3 L O2 to keep sats around 97%.  -Patient noted to have been started on Paxlovid as outpatient 2 days prior to admission and had no significant improvement and potential interactions with several of her other chronic medications and as such Paxlovid was held. -Patient placed on empiric antibiotics of Rocephin and doxycycline to cover potential secondary bacterial pneumonia. -Patient placed on  Decadron, incentive spirometry, flutter valve, Mucinex and scheduled DuoNebs. -Patient improved clinically, wanted to be discharged home, noted to have sats of 100% on room air and greater than 92% on ambulation with no significant respiratory distress. -Patient transition to oral Omnicef from Rocephin,  maintained on doxycycline and will be discharged on 5 more days of oral antibiotics to complete a 7-day course of antibiotic treatment.  Patient will also be discharged on Mucinex as well as scheduled Combivent 3 times daily for 5 to 7 days and then as needed. -Patient will be discharged in stable and improved condition.   2.  Hyperlipidemia -Statin was held during the hospitalization will be resumed 1 week postdischarge.   3.  Insulin-dependent type 2 diabetes mellitus -Patient noted to have some low blood glucose levels felt likely secondary to poor oral intake. -Patient started on Decadron with increased CBGs.   -Patient maintained on sliding scale insulin, started on new coverage NovoLog and a decreased dose of home regimen of long-acting insulin.   -Outpatient follow-up.    4.  Hypertension -Patient maintained on home regimen losartan.   5.  Hypothyroidism -Patient maintained on home regimen of Cytomel, Synthroid.   6.  Bipolar disorder/depression/anxiety -Stable. -Patient maintained on home regimen lithium, Seroquel, Xanax. -Depakote held and will be resumed on discharge.   7.  RLS -Resume home regimen Requip.      Procedures: Chest x-ray 06/03/2022    Consultations: None  Discharge Exam: Vitals:   06/05/22 1328 06/05/22 1456  BP:    Pulse:    Resp:    Temp:    SpO2: 92% 91%    General: NAD Cardiovascular: RRR no murmurs rubs or gallops.  No JVD.  No lower extremity edema. Respiratory: Lungs clear to auscultation bilaterally.  No wheezes, no crackles, no rhonchi.  Fair air movement.  Speaking in full sentences.  Discharge Instructions   Discharge Instructions     Diet Carb Modified   Complete by: As directed    Increase activity slowly   Complete by: As directed       Allergies as of 06/05/2022       Reactions   Codeine Anxiety, Other (See Comments)   Hallucinations, tolerates oxycodone    Procaine Hcl Palpitations   Epinephrine Palpitations,  Other (See Comments)   Makes heart race at the dentist's   Metformin And Related Nausea Only   Ozempic (0.25 Or 0.5 Mg-dose) [semaglutide(0.25 Or 0.5mg -dos)] Nausea Only   Aspirin Nausea And Vomiting, Other (See Comments)   Burns the stomach   Augmentin [amoxicillin-pot Clavulanate] Diarrhea   Benadryl [diphenhydramine] Other (See Comments)   Per MD "inhibits potency of gabapentin, lithium etc"   Diflucan [fluconazole] Other (See Comments)   Unknown reaction   Dilaudid [hydromorphone Hcl] Other (See Comments)   Migraines and nightmares    Hydromorphone Other (See Comments)   Reaction unconfirmed   Morphine Other (See Comments)   headache   Other Nausea And Vomiting, Other (See Comments)   Pt states that all -mycins cause N/V. (MACROLIDES)  Exposure to steroids MUST BE LIMITED (or, mental status change/manic behavior happens)   Prednisone    Long term steroid problems with lithium and blood sugar.    Tramadol Hcl Other (See Comments)   Made the patient "feel weird"   Macrobid [nitrofurantoin] Rash, Other (See Comments)   Rash around ankles   Sulfa Antibiotics Other (See Comments)   Unknown reaction        Medication List  STOP taking these medications    cephALEXin 500 MG capsule Commonly known as: KEFLEX   Paxlovid (300/100) 20 x 150 MG & 10 x 100MG  Tbpk Generic drug: nirmatrelvir & ritonavir       TAKE these medications    Acetaminophen 500 MG capsule Take 1,000 mg by mouth every 6 (six) hours as needed for fever or pain. What changed: Another medication with the same name was removed. Continue taking this medication, and follow the directions you see here.   albuterol 108 (90 Base) MCG/ACT inhaler Commonly known as: VENTOLIN HFA Inhale 1-2 puffs into the lungs every 6 (six) hours as needed for wheezing or shortness of breath.   ALPRAZolam 0.5 MG tablet Commonly known as: XANAX Take 1 tablet (0.5 mg total) by mouth 4 (four) times daily as needed for  anxiety. What changed:  when to take this additional instructions   atorvastatin 40 MG tablet Commonly known as: LIPITOR Take 1 tablet (40 mg total) by mouth See admin instructions. Take 40 mg by mouth at 5 PM Start taking on: June 12, 2022 What changed: These instructions start on June 12, 2022. If you are unsure what to do until then, ask your doctor or other care provider.   b complex vitamins capsule Take 1 capsule by mouth daily.   BENEFIBER PO Take 4-5 g by mouth daily as needed (for constipation- mix as directed).   bismuth subsalicylate [id number] suspension Commonly known as: PEPTO BISMOL Take 30 mLs by mouth every 6 (six) hours as needed.   calcium carbonate 750 MG chewable tablet Commonly known as: TUMS EX Chew 1 tablet by mouth daily.   cefdinir 300 MG capsule Commonly known as: OMNICEF Take 1 capsule (300 mg total) by mouth every 12 (twelve) hours for 5 days.   cholecalciferol 25 MCG (1000 UNIT) tablet Commonly known as: VITAMIN D3 Take 1,000-2,000 Units by mouth See admin instructions. Take 1,000 units by mouth in the morning and 2,000 units at 5 PM   Combivent Respimat 20-100 MCG/ACT Aers respimat Generic drug: Ipratropium-Albuterol Inhale 1 puff into the lungs in the morning, at noon, and at bedtime for 7 days, THEN 1 puff every 6 (six) hours as needed for wheezing. Start taking on: June 05, 2022   Contour Next Test test strip Generic drug: glucose blood USE TO TEST BLOOD SUGAR TWICE DAILY   cyclobenzaprine 5 MG tablet Commonly known as: FLEXERIL Take 5 mg by mouth 3 (three) times daily as needed for muscle spasms.   dexamethasone 6 MG tablet Commonly known as: DECADRON Take 1 tablet (6 mg total) by mouth daily for 8 days. Start taking on: June 06, 2022   diclofenac 75 MG EC tablet Commonly known as: VOLTAREN Take 75 mg by mouth 2 (two) times daily.   diclofenac Sodium 1 % Gel Commonly known as: Voltaren Apply 4 g topically 4 (four) times  daily. What changed:  when to take this reasons to take this   dicyclomine 20 MG tablet Commonly known as: BENTYL Take 1 tablet (20 mg total) by mouth 3 (three) times daily as needed for spasms.   divalproex 250 MG DR tablet Commonly known as: DEPAKOTE Take 1 tablet (250 mg total) by mouth 2 (two) times daily AND 3 tablets (750 mg total) at bedtime. What changed: See the new instructions.   donepezil 10 MG tablet Commonly known as: ARICEPT Take 1/2 tablet daily What changed:  how much to take how to take this when to take  this additional instructions   doxycycline 100 MG tablet Commonly known as: VIBRA-TABS Take 1 tablet (100 mg total) by mouth every 12 (twelve) hours for 5 days.   ezetimibe 10 MG tablet Commonly known as: Zetia Take 1 tablet (10 mg total) by mouth every evening. Start taking on: June 12, 2022 What changed:  when to take this These instructions start on June 12, 2022. If you are unsure what to do until then, ask your doctor or other care provider.   fluticasone 50 MCG/ACT nasal spray Commonly known as: FLONASE Place 1 spray into both nostrils 2 (two) times daily as needed for allergies or rhinitis.   gabapentin 600 MG tablet Commonly known as: NEURONTIN Take 600 mg by mouth See admin instructions. Take 600 mg by mouth in the morning, at 5 PM, and at bedtime   guaiFENesin 600 MG 12 hr tablet Commonly known as: MUCINEX Take 2 tablets (1,200 mg total) by mouth 2 (two) times daily for 5 days.   guaiFENesin-dextromethorphan 100-10 MG/5ML syrup Commonly known as: ROBITUSSIN DM Take 5 mLs by mouth every 4 (four) hours as needed for cough (chest congestion).   ibuprofen 800 MG tablet Commonly known as: ADVIL Take 800 mg by mouth every 8 (eight) hours as needed for moderate pain.   insulin lispro 100 UNIT/ML KwikPen Commonly known as: HumaLOG KwikPen ADMINISTER 12 UNITS UNDER THE SKIN BEFORE MEALS What changed:  how much to take how to take  this when to take this additional instructions   ipratropium 0.06 % nasal spray Commonly known as: ATROVENT Place 2 sprays into both nostrils 4 (four) times daily as needed for rhinitis.   Lantus SoloStar 100 UNIT/ML Solostar Pen Generic drug: insulin glargine ADMINISTER 66 UNITS UNDER THE SKIN AT BEDTIME What changed:  how much to take how to take this when to take this additional instructions   levothyroxine 75 MCG tablet Commonly known as: SYNTHROID Take 1 tablet (75 mcg total) by mouth daily before breakfast.   lidocaine 2 % solution Commonly known as: XYLOCAINE Use as directed 15 mLs in the mouth or throat as directed.   liothyronine 5 MCG tablet Commonly known as: CYTOMEL TAKE 1 TABLET BY MOUTH DAILY WITH LEVOTHYROXINE  TAKE BEFORE BREAKFAST What changed:  how much to take how to take this when to take this additional instructions   lithium carbonate 450 MG ER tablet Commonly known as: ESKALITH Take 1 tablet (450 mg total) by mouth at bedtime.   loperamide 2 MG capsule Commonly known as: IMODIUM Take 2 capsules (4 mg total) by mouth 3 (three) times daily as needed for diarrhea or loose stools.   loratadine 10 MG tablet Commonly known as: CLARITIN Take 10 mg by mouth daily as needed for allergies.   losartan 25 MG tablet Commonly known as: COZAAR Take 25 mg by mouth See admin instructions. Take 25 mg by mouth at 5 PM   memantine 10 MG tablet Commonly known as: NAMENDA TAKE 1/2 TABLET BY MOUTH EVERY EVENING FOR ONE WEEK, THEN STOP What changed:  how much to take how to take this when to take this additional instructions   Microlet Lancets Misc TEST DAILY AS DIRECTED   nitroGLYCERIN 0.4 MG SL tablet Commonly known as: NITROSTAT Place 1 tablet (0.4 mg total) under the tongue every 5 (five) minutes as needed for chest pain.   ondansetron 4 MG tablet Commonly known as: ZOFRAN Take 1 tablet (4 mg total) by mouth every 6 (six) hours. What changed:  when to take this reasons to take this   oxyCODONE-acetaminophen 5-325 MG tablet Commonly known as: PERCOCET/ROXICET Take 1-2 tablets by mouth every 6 (six) hours as needed for severe pain.   pantoprazole 40 MG tablet Commonly known as: PROTONIX Take 1 tablet (40 mg total) by mouth daily at 6 (six) AM. Start taking on: June 06, 2022   promethazine-dextromethorphan 6.25-15 MG/5ML syrup Commonly known as: PROMETHAZINE-DM Take 5 mLs by mouth every 6 (six) hours.   QUEtiapine 200 MG tablet Commonly known as: SEROQUEL TAKE 1 TAB PO q 5 pm and 1 tab po QHS What changed:  how much to take how to take this when to take this additional instructions   rOPINIRole 0.5 MG tablet Commonly known as: REQUIP Takes 4 tabs at 5 pm and 3 tabs at bedtime and 1-2 tabs prn What changed:  how much to take how to take this when to take this additional instructions   THERAFLU SEVERE COLD NIGHTTIME PO Take 1 packet by mouth every 6 (six) hours as needed (for cold-like symptoms).        Allergies  Allergen Reactions   Codeine Anxiety and Other (See Comments)    Hallucinations, tolerates oxycodone    Procaine Hcl Palpitations   Epinephrine Palpitations and Other (See Comments)    Makes heart race at the dentist's    Metformin And Related Nausea Only   Ozempic (0.25 Or 0.5 Mg-Dose) [Semaglutide(0.25 Or 0.5mg -Dos)] Nausea Only   Aspirin Nausea And Vomiting and Other (See Comments)    Burns the stomach   Augmentin [Amoxicillin-Pot Clavulanate] Diarrhea   Benadryl [Diphenhydramine] Other (See Comments)    Per MD "inhibits potency of gabapentin, lithium etc"   Diflucan [Fluconazole] Other (See Comments)    Unknown reaction    Dilaudid [Hydromorphone Hcl] Other (See Comments)    Migraines and nightmares    Hydromorphone Other (See Comments)    Reaction unconfirmed   Morphine Other (See Comments)    headache   Other Nausea And Vomiting and Other (See Comments)    Pt states that all  -mycins cause N/V. (MACROLIDES)   Exposure to steroids MUST BE LIMITED (or, mental status change/manic behavior happens)   Prednisone     Long term steroid problems with lithium and blood sugar.    Tramadol Hcl Other (See Comments)    Made the patient "feel weird"   Macrobid [Nitrofurantoin] Rash and Other (See Comments)    Rash around ankles   Sulfa Antibiotics Other (See Comments)    Unknown reaction    Follow-up Information     London Pepper, MD Follow up in 2 week(s).   Specialty: Family Medicine Contact information: [place] [address] [phone]                  The results of significant diagnostics from this hospitalization (including imaging, microbiology, ancillary and laboratory) are listed below for reference.    Significant Diagnostic Studies: DG Chest 2 View  Result Date: 06/03/2022 CLINICAL DATA:  Evaluation for pneumonia. COVID positive. Productive cough on Monday. Now with worsening cough, poor appetite, body aches, and nausea. EXAM: CHEST - 2 VIEW COMPARISON:  03/29/2022 FINDINGS: Slightly shallow inspiration. Heart size and pulmonary vascularity are normal. Suggestion of mild patchy infiltration in the lung bases which may indicate early multifocal pneumonia. No pleural effusions. No pneumothorax. Mediastinal contours appear intact. Calcified and tortuous aorta. Thoracolumbar scoliosis convex towards the left. Degenerative changes in the spine. IMPRESSION: Suggestion  of patchy infiltration in the lung bases possibly indicating early multifocal pneumonia. Electronically Signed   By: Lucienne Capers M.D.   On: 06/03/2022 21:23   CT Renal Stone Study  Result Date: 05/29/2022 CLINICAL DATA:  Abdominal and flank pain suspected kidney stone, laterality not specified EXAM: CT ABDOMEN AND PELVIS WITHOUT CONTRAST TECHNIQUE: Multidetector CT imaging of the abdomen and pelvis was performed following the standard protocol without  IV contrast. RADIATION DOSE REDUCTION: This exam was performed according to the departmental dose-optimization program which includes automated exposure control, adjustment of the mA and/or kV according to patient size and/or use of iterative reconstruction technique. COMPARISON:  03/29/2022 FINDINGS: Lower chest: Minimal atelectasis versus scarring at base of lingula. Hepatobiliary: Gallbladder and liver normal appearance Pancreas: Normal appearance Spleen: Normal appearance Adrenals/Urinary Tract: Adrenal glands normal appearance. Tiny nonobstructing renal calculi bilaterally. Mild chronic prominence of RIGHT renal pelvis similar to previous exam. No urinary tract calcification, new hydronephrosis or ureteral dilatation, or ureteral calcification. No renal masses. Bladder unremarkable. Stomach/Bowel: Prior sigmoid anastomosis. Minimal colonic diverticulosis without evidence of diverticulitis. Stomach decompressed. Remaining bowel loops unremarkable. Appendix not identified. Vascular/Lymphatic: Atherosclerotic calcifications aorta and iliac arteries without aneurysm. No adenopathy. Reproductive: Uterus surgically absent.  Atrophic ovaries. Other: Small umbilical hernia containing fat. No free air or free fluid.  No inflammatory process. Musculoskeletal: Degenerative disc disease changes L1-L2. IMPRESSION: Tiny nonobstructing renal calculi bilaterally. Minimal colonic diverticulosis without evidence of diverticulitis. Small umbilical hernia containing fat. No acute intra-abdominal or intrapelvic abnormalities. Aortic Atherosclerosis (ICD10-I70.0). Electronically Signed   By: Lavonia Dana M.D.   On: 05/29/2022 12:59   DG Lumbar Spine Complete  Result Date: 05/21/2022 CLINICAL DATA:  Pelvic pain. Low back pain radiating into the left leg EXAM: LUMBAR SPINE - COMPLETE 4+ VIEW COMPARISON:  CT 03/29/2022 FINDINGS: There is a curvature of the lower thoracic and upper lumbar spine which is convex to the left. Mild  anterolisthesis of L4 on L5 measures 2-3 mm. Multi level disc space narrowing and endplate spurring noted, most advanced at L1-2. No signs of acute fracture. IMPRESSION: 1. Thoracolumbar scoliosis. 2. Lumbar degenerative disc disease 3. No acute abnormality. Electronically Signed   By: Kerby Moors M.D.   On: 05/21/2022 05:37    Microbiology: Recent Results (from the past 240 hour(s))  Urine Culture (for pregnant, neutropenic or urologic patients or patients with an indwelling urinary catheter)     Status: None   Collection Time: 05/29/22  9:55 AM   Specimen: Urine, Clean Catch  Result Value Ref Range Status   Specimen Description   Final    URINE, CLEAN CATCH Performed at [facility], [address] [place], [place] [address]    Special Requests   Final    NONE Performed at [facility], [address], [place], [place] [address]    Culture   Final    NO GROWTH Performed at [facility], [place] [address], [place], [place] [address]    Report Status 05/30/2022 FINAL  Final  SARS Coronavirus 2 by RT PCR (hospital order, performed in [facility] hospital lab) *cepheid single result test* Anterior Nasal Swab     Status: Abnormal   Collection Time: 06/03/22 10:40 PM   Specimen: Anterior Nasal Swab  Result Value Ref Range Status   SARS Coronavirus 2 by RT PCR POSITIVE (A) NEGATIVE Final    Comment: (NOTE) SARS-CoV-2 target nucleic acids are DETECTED  SARS-CoV-2 RNA is generally detectable in upper respiratory specimens  during the acute phase of infection.  Positive results are indicative  of the presence of the identified virus, but do not rule out bacterial infection or co-infection with other pathogens not detected by the test.  Clinical correlation with patient history and  other diagnostic information is necessary to determine patient infection status.  The expected result is negative.  Fact Sheet for Patients:    https://www.patel.info/   Fact Sheet for Healthcare Providers:   https://hall.com/    This test is not yet approved or cleared by the [place] FDA and  has been authorized for detection and/or diagnosis of SARS-CoV-2 by FDA under an Emergency Use Authorization (EUA).  This EUA will remain in effect (meaning this test can be used) for the duration of  the COVID-19 declaration under Section 564(b)(1)  of the Act, 21 U.S.C. section 360-bbb-3(b)(1), unless the authorization is terminated or revoked sooner.   Performed at [facility], [address] [place], [place] [address]      Labs: Basic Metabolic Panel: Recent Labs  Lab 06/03/22 2106 06/04/22 0754 06/04/22 1233 06/05/22 0501  NA 141 137  --  141  K 4.1 6.0* 4.8 4.5  CL 108 108  --  109  CO2 26 21*  --  25  GLUCOSE 103* 229*  --  275*  BUN 10 10  --  13  CREATININE 0.81 0.65  --  0.86  CALCIUM 10.3 9.6  --  10.1  MG 1.8  --   --   --    Liver Function Tests: Recent Labs  Lab 06/03/22 2106  AST 19  ALT 22  ALKPHOS 78  BILITOT 0.3  PROT 6.6  ALBUMIN 3.9   No results for input(s): "LIPASE", "AMYLASE" in the last 168 hours. No results for input(s): "AMMONIA" in the last 168 hours. CBC: Recent Labs  Lab 06/03/22 2106 06/04/22 0537 06/05/22 0501  WBC 5.2 5.9 6.4  NEUTROABS 2.7  --  5.4  HGB 13.3 13.0 13.2  HCT 42.2 42.4 42.9  MCV 91.9 94.0 93.1  PLT 172 160 169   Cardiac Enzymes: No results for input(s): "CKTOTAL", "CKMB", "CKMBINDEX", "TROPONINI" in the last 168 hours. BNP: BNP (last 3 results) Recent Labs    07/06/21 1430  BNP 8.8    ProBNP (last 3 results) No results for input(s): "PROBNP" in the last 8760 hours.  CBG: Recent Labs  Lab 06/04/22 1207 06/04/22 1620 06/04/22 2216 06/05/22 0737 06/05/22 1133  GLUCAP 255* 231* 286* 250* 314*       Signed:  Irine Seal MD.  [facility] 06/05/2022, 3:08  PM

## 2022-06-05 NOTE — Progress Notes (Addendum)
  Transition of Care [facility]) Screening Note   Patient Details  Name: [name] [name] Date of Birth: [birth year]   Transition of Care [facility]) CM/SW Contact:    Henrietta Dine, RN Phone Number: 06/05/2022, 1:34 PM    Transition of Care Department [facility]) has reviewed patient and no TOC needs have been identified at this time. We will continue to monitor patient advancement through interdisciplinary progression rounds. If new patient transition needs arise, please place a TOC consult.

## 2022-06-05 NOTE — Plan of Care (Signed)
  Problem: Clinical Measurements: Goal: Respiratory complications will improve Outcome: Progressing   Problem: Activity: Goal: Risk for activity intolerance will decrease Outcome: Progressing   Problem: Coping: Goal: Level of anxiety will decrease Outcome: Progressing   Problem: Clinical Measurements: Goal: Cardiovascular complication will be avoided Outcome: Adequate for Discharge   Problem: Education: Goal: Knowledge of General Education information will improve Description: Including pain rating scale, medication(s)/side effects and non-pharmacologic comfort measures Outcome: Completed/Met   Problem: Pain Managment: Goal: General experience of comfort will improve Outcome: Completed/Met   Problem: Education: Goal: Knowledge of risk factors and measures for prevention of condition will improve Outcome: Completed/Met   Problem: Coping: Goal: Psychosocial and spiritual needs will be supported Outcome: Completed/Met

## 2022-06-05 NOTE — Plan of Care (Signed)
  Problem: Health Behavior/Discharge Planning: Goal: Ability to manage health-related needs will improve Outcome: Adequate for Discharge   Problem: Clinical Measurements: Goal: Ability to maintain clinical measurements within normal limits will improve Outcome: Adequate for Discharge Goal: Will remain free from infection Outcome: Adequate for Discharge Goal: Diagnostic test results will improve Outcome: Adequate for Discharge Goal: Respiratory complications will improve 06/05/2022 1728 by Andre Lefort, RN Outcome: Adequate for Discharge 06/05/2022 1031 by Andre Lefort, RN Outcome: Progressing Goal: Cardiovascular complication will be avoided 06/05/2022 1728 by Andre Lefort, RN Outcome: Adequate for Discharge 06/05/2022 1031 by Andre Lefort, RN Outcome: Adequate for Discharge   Problem: Activity: Goal: Risk for activity intolerance will decrease 06/05/2022 1728 by Andre Lefort, RN Outcome: Adequate for Discharge 06/05/2022 1031 by Andre Lefort, RN Outcome: Progressing   Problem: Nutrition: Goal: Adequate nutrition will be maintained Outcome: Adequate for Discharge   Problem: Coping: Goal: Level of anxiety will decrease 06/05/2022 1728 by Andre Lefort, RN Outcome: Adequate for Discharge 06/05/2022 1031 by Andre Lefort, RN Outcome: Progressing   Problem: Elimination: Goal: Will not experience complications related to bowel motility Outcome: Adequate for Discharge Goal: Will not experience complications related to urinary retention Outcome: Adequate for Discharge   Problem: Safety: Goal: Ability to remain free from injury will improve Outcome: Adequate for Discharge   Problem: Skin Integrity: Goal: Risk for impaired skin integrity will decrease Outcome: Adequate for Discharge   Problem: Respiratory: Goal: Will maintain a patent airway Outcome: Adequate for Discharge Goal: Complications related to the disease process, condition or treatment will be avoided or  minimized Outcome: Adequate for Discharge   Problem: Education: Goal: Ability to describe self-care measures that may prevent or decrease complications (Diabetes Survival Skills Education) will improve Outcome: Adequate for Discharge Goal: Individualized Educational Video(s) Outcome: Adequate for Discharge   Problem: Coping: Goal: Ability to adjust to condition or change in health will improve Outcome: Adequate for Discharge   Problem: Fluid Volume: Goal: Ability to maintain a balanced intake and output will improve Outcome: Adequate for Discharge   Problem: Health Behavior/Discharge Planning: Goal: Ability to identify and utilize available resources and services will improve Outcome: Adequate for Discharge Goal: Ability to manage health-related needs will improve Outcome: Adequate for Discharge   Problem: Metabolic: Goal: Ability to maintain appropriate glucose levels will improve Outcome: Adequate for Discharge   Problem: Nutritional: Goal: Maintenance of adequate nutrition will improve Outcome: Adequate for Discharge Goal: Progress toward achieving an optimal weight will improve Outcome: Adequate for Discharge   Problem: Skin Integrity: Goal: Risk for impaired skin integrity will decrease Outcome: Adequate for Discharge   Problem: Tissue Perfusion: Goal: Adequacy of tissue perfusion will improve Outcome: Adequate for Discharge   Problem: Education: Goal: Knowledge of General Education information will improve Description: Including pain rating scale, medication(s)/side effects and non-pharmacologic comfort measures Outcome: Completed/Met   Problem: Pain Managment: Goal: General experience of comfort will improve Outcome: Completed/Met   Problem: Education: Goal: Knowledge of risk factors and measures for prevention of condition will improve Outcome: Completed/Met   Problem: Coping: Goal: Psychosocial and spiritual needs will be supported Outcome:  Completed/Met

## 2022-06-05 NOTE — Inpatient Diabetes Management (Signed)
Inpatient Diabetes Program Recommendations  AACE/ADA: New Consensus Statement on Inpatient Glycemic Control (2015)  Target Ranges:  Prepandial:   less than 140 mg/dL      Peak postprandial:   less than 180 mg/dL (1-2 hours)      Critically ill patients:  140 - 180 mg/dL   Lab Results  Component Value Date   GLUCAP 250 (H) 06/05/2022   HGBA1C 7.2 (H) 05/06/2022    Review of Glycemic Control  Latest Reference Range & Units 06/04/22 07:46 06/04/22 12:07 06/04/22 16:20 06/04/22 22:16 06/05/22 07:37  Glucose-Capillary 70 - 99 mg/dL 222 (H) 255 (H) 231 (H) 286 (H) 250 (H)  (H): Data is abnormally high  Diabetes history: DM2 Outpatient Diabetes medications: Lantus 70 units QD, Humalog 12 units TID Current orders for Inpatient glycemic control: Semglee 20 units QHS, Novolog 0-9 units TID and 0-5 units QHS, Novolog 6 units MCTID, Decadron 6 mg QD  Inpatient Diabetes Program Recommendations:    Novolog 0-15 units TID    Will continue to follow while inpatient.  Thank you, Reche Dixon, MSN, [place] [facility] Coordinator Inpatient Diabetes Program [phone] (team pager from 8a-5p)

## 2022-06-05 NOTE — Progress Notes (Signed)
SATURATION QUALIFICATIONS: (This note is used to comply with regulatory documentation for home oxygen)  Patient Saturations on Room Air at Rest = 99%  Patient Saturations on Room Air while Ambulating = 92%  Please briefly explain why patient needs home oxygen:  did not require oxygen for ambulation

## 2022-06-05 NOTE — Progress Notes (Signed)
[id number] - reviewed all home meds with spouse via phone call. Andre Lefort

## 2022-06-08 ENCOUNTER — Telehealth: Payer: Self-pay | Admitting: Psychiatry

## 2022-06-08 ENCOUNTER — Telehealth: Payer: Self-pay | Admitting: *Deleted

## 2022-06-08 NOTE — Telephone Encounter (Signed)
Pt called--stated been to the hospital and adjust the insulin lantus 75 units and Humolog 14 units every meal since taking steroid until this coming Friday. Pt questioning if needed to re-adjust the insulin. Please advise.

## 2022-06-08 NOTE — Telephone Encounter (Signed)
Error - duplicate

## 2022-06-08 NOTE — Telephone Encounter (Signed)
Pt verified taking dexamethasone 6 mg for 8 days and have the [place] logs.-

## 2022-06-10 NOTE — Telephone Encounter (Signed)
Notified pt regarding blood sugar results and insulin units instructions. Pt voiced understanding.

## 2022-06-10 NOTE — Telephone Encounter (Signed)
Yes we are able to see. I have printed the report to the printer and will have some one put on your desk

## 2022-06-11 ENCOUNTER — Ambulatory Visit (INDEPENDENT_AMBULATORY_CARE_PROVIDER_SITE_OTHER): Payer: Medicare Other | Admitting: Internal Medicine

## 2022-06-11 ENCOUNTER — Encounter (INDEPENDENT_AMBULATORY_CARE_PROVIDER_SITE_OTHER): Payer: Self-pay | Admitting: Internal Medicine

## 2022-06-11 VITALS — BP 138/86 | HR 78 | Temp 97.8°F | Ht 60.0 in | Wt 180.0 lb

## 2022-06-11 DIAGNOSIS — Z6837 Body mass index (BMI) 37.0-37.9, adult: Secondary | ICD-10-CM

## 2022-06-11 DIAGNOSIS — U071 COVID-19: Secondary | ICD-10-CM

## 2022-06-11 DIAGNOSIS — E1165 Type 2 diabetes mellitus with hyperglycemia: Secondary | ICD-10-CM

## 2022-06-11 DIAGNOSIS — E669 Obesity, unspecified: Secondary | ICD-10-CM

## 2022-06-11 DIAGNOSIS — Z794 Long term (current) use of insulin: Secondary | ICD-10-CM | POA: Diagnosis not present

## 2022-06-11 DIAGNOSIS — I10 Essential (primary) hypertension: Secondary | ICD-10-CM | POA: Diagnosis not present

## 2022-06-11 NOTE — Assessment & Plan Note (Signed)
Blood pressure slightly elevated today,  on losartan 25 mg a day without adverse effects.   This may be secondary to steroids.  She will continue on current regimen.  She will monitor for symptoms of orthostasis.  She was also advised to maintain adequate hydration and to increase mobility.

## 2022-06-11 NOTE — Progress Notes (Signed)
Office: [phone]  /  Fax: [phone]  WEIGHT SUMMARY AND BIOMETRICS  Vitals Temp: 97.8 F (36.6 C) BP: 138/86 Pulse Rate: 78 SpO2: 95 %   Anthropometric Measurements Height: 5' (1.524 m) Weight: 180 lb (81.6 kg) BMI (Calculated): 35.15 Weight at Last Visit: 185 lb Starting Weight: 191 lb Total Weight Loss (lbs): 11 lb (4.99 kg) Peak Weight: 194 lb   Body Composition  Body Fat %: 47.3 % Fat Mass (lbs): 85.2 lbs Muscle Mass (lbs): 90.2 lbs Total Body Water (lbs): 68.6 lbs Visceral Fat Rating : 15    HPI  Chief Complaint: OBESITY  [name] [name] is here to discuss her progress with her obesity treatment plan. She is on the the Category 2 Plan and states she is following her eating plan approximately 50 % of the time. She states she is not exercising.  Interval History:  Since last office visit she has lost 5 lbs. Unfortunately she was recently hospitalized with COVID and possible bacterial pneumonia.  She is currently on steroids and on antibiotics.  She has been working on Acupuncturist but is noticing that she remains hungry after lunch and is not sure of what to do. She reports fair adherence to reduced calorie nutritional plan. She has been working on not skipping meals and making healthier choices.  She has been eating healthier snacks. Reports problems with appetite and hunger signals.  Denies problems with satiety and satiation.  Denies problems with eating patterns and portion control.  Denies abnormal cravings. Denies feeling deprived or restricted but at times is not sure of what to eat.   Barriers identified: having difficulty with meal prep and planning, orthopedic problems or chronic pain affecting mobility, and medical comorbidities.   Pharmacotherapy for weight loss: She is currently taking no anti-obesity medication.    ASSESSMENT AND PLAN  TREATMENT PLAN FOR OBESITY:  Recommended Dietary Goals  [name] [name] is currently in the  action stage of change. As such, her goal is to continue weight management plan. She has agreed to: continue current plan and may increase calories at lunch to improve satiation.  We discussed eating till satisfied not stuffed and to listen to body cues  Behavioral Intervention  We discussed the following Behavioral Modification Strategies today:  Patient advised on management and differentiation between hunger, appetite and cravings.  I believe that she is truly experiencing hunger and is due to the fact that she is not getting enough calories with her lunch.  She also skips breakfast .  This was addressed today  Additional resources provided today: None  Recommended Physical Activity Goals  [name] [name] has been advised to work up to 150 minutes of moderate intensity aerobic activity a week and strengthening exercises 2-3 times per week for cardiovascular health, weight loss maintenance and preservation of muscle mass.   She has agreed to :  Think about ways to increase physical activity.  She has been in the hospital for several days and is also receiving steroids she is therefore at risk for deconditioning and muscle weakness.  I encouraged her to increase walking in the home.  Pharmacotherapy We discussed various medication options to help [name] [name] with her weight loss efforts and we both agreed to : continue with nutritional and behavioral strategies  ASSOCIATED CONDITIONS ADDRESSED TODAY  Essential hypertension Assessment & Plan: Blood pressure slightly elevated today,  on losartan 25 mg a day without adverse effects.   This may be secondary to steroids.  She will continue on current regimen.  She will monitor for symptoms of orthostasis.  She was also advised to maintain adequate hydration and to increase mobility.   Obesity with current BMI of 37  Type 2 diabetes mellitus with hyperglycemia, with long-term current use of insulin Assessment & Plan: She is on a basal bolus regimen  and has CGM.  She has had an intolerance to metformin, SGLT2 drugs and semaglutide in the past.  Her nausea with semaglutide might have been induced by dietary patterns.  It seems that she has wide fluctuations in her blood sugars and occasional hypoglycemia which he self manages.  She denies nighttime hypoglycemia.  She will continue to work with primary care team on adjustment.  I think it may be worthwhile trying semaglutide again with nutritional counseling to avoid adverse reactions associated with certain foods.  This may also be safer for patient if she tolerates it.   COVID-19 Assessment & Plan: Reviewed hospital records.  It seems that she had tested positive for COVID prior to admission but did not respond to Paxlovid was experiencing drug interactions.  Patient was treated with Rocephin and steroids and was discharged on doxycycline.  She is completing antibiotics and is currently taking dexamethasone.  She notes that her blood sugars have gone up and has restarted using her Premeal insulin.  She also notes weakness but denies any difficulty breathing or worsening cough.  No fever or chills reported.  She has a follow-up scheduled with her PCP for posthospital visit.      PHYSICAL EXAM:  Blood pressure 138/86, pulse 78, temperature 97.8 F (36.6 C), height 5' (1.524 m), weight 180 lb (81.6 kg), SpO2 95 %. Body mass index is 35.15 kg/m.  General: She is overweight, cooperative, alert, well developed, and in no acute distress. PSYCH: Has normal mood, affect and thought process.   HEENT: EOMI, sclerae are anicteric. Lungs: Normal breathing effort, no conversational dyspnea. Extremities: No edema.  Neurologic: No gross sensory or motor deficits. No tremors or fasciculations noted.    DIAGNOSTIC DATA REVIEWED:  BMET    Component Value Date/Time   NA 141 06/05/2022 0501   NA 140 01/27/2021 1526   K 4.5 06/05/2022 0501   CL 109 06/05/2022 0501   CO2 25 06/05/2022 0501   GLUCOSE  275 (H) 06/05/2022 0501   BUN 13 06/05/2022 0501   BUN 8 01/27/2021 1526   CREATININE 0.86 06/05/2022 0501   CREATININE 0.85 10/09/2019 1052   CALCIUM 10.1 06/05/2022 0501   GFRNONAA >60 06/05/2022 0501   GFRNONAA 57 (L) 11/17/2017 0000   GFRAA >60 12/10/2019 0657   GFRAA 67 11/17/2017 0000   Lab Results  Component Value Date   HGBA1C 7.2 (H) 05/06/2022   HGBA1C 5.5 12/25/2010   No results found for: "INSULIN" Lab Results  Component Value Date   TSH 0.790 05/06/2022   CBC    Component Value Date/Time   WBC 6.4 06/05/2022 0501   RBC 4.61 06/05/2022 0501   HGB 13.2 06/05/2022 0501   HGB 14.0 01/27/2021 1526   HCT 42.9 06/05/2022 0501   HCT 41.5 01/27/2021 1526   PLT 169 06/05/2022 0501   PLT 255 01/27/2021 1526   MCV 93.1 06/05/2022 0501   MCV 87 01/27/2021 1526   MCH 28.6 06/05/2022 0501   MCHC 30.8 06/05/2022 0501   RDW 13.5 06/05/2022 0501   RDW 13.6 01/27/2021 1526   Iron Studies    Component Value Date/Time   FERRITIN 43 07/06/2015 0459   Lipid Panel  Component Value Date/Time   CHOL 159 05/06/2022 0948   TRIG 175 (H) 05/06/2022 0948   HDL 58 05/06/2022 0948   CHOLHDL 3 12/26/2021 1004   VLDL 46.8 (H) 12/26/2021 1004   LDLCALC 72 05/06/2022 0948   LDLDIRECT 84.0 12/26/2021 1004   Hepatic Function Panel     Component Value Date/Time   PROT 6.6 06/03/2022 2106   PROT 6.4 01/27/2021 1526   ALBUMIN 3.9 06/03/2022 2106   ALBUMIN 4.4 01/27/2021 1526   AST 19 06/03/2022 2106   ALT 22 06/03/2022 2106   ALKPHOS 78 06/03/2022 2106   BILITOT 0.3 06/03/2022 2106   BILITOT 0.4 01/27/2021 1526   BILIDIR 0.2 06/14/2021 0406   IBILI 0.5 06/14/2021 0406      Component Value Date/Time   TSH 0.790 05/06/2022 0948   Nutritional Lab Results  Component Value Date   VD25OH 37.1 05/06/2022   VD25OH 21 (L) 07/04/2012     Return in about 2 weeks (around 06/25/2022) for For Weight Mangement with Dr. Gerarda Fraction - may split a 40 minute appointment if needed,..  She was informed of the importance of frequent follow up visits to maximize her success with intensive lifestyle modifications for her multiple health conditions.   ATTESTASTION STATEMENTS:  Reviewed by clinician on day of visit: allergies, medications, problem list, medical history, surgical history, family history, social history, and previous encounter notes.     Thomes Dinning, MD

## 2022-06-11 NOTE — Assessment & Plan Note (Signed)
She is on a basal bolus regimen and has CGM.  She has had an intolerance to metformin, SGLT2 drugs and semaglutide in the past.  Her nausea with semaglutide might have been induced by dietary patterns.  It seems that she has wide fluctuations in her blood sugars and occasional hypoglycemia which he self manages.  She denies nighttime hypoglycemia.  She will continue to work with primary care team on adjustment.  I think it may be worthwhile trying semaglutide again with nutritional counseling to avoid adverse reactions associated with certain foods.  This may also be safer for patient if she tolerates it.

## 2022-06-11 NOTE — Assessment & Plan Note (Signed)
Reviewed hospital records.  It seems that she had tested positive for COVID prior to admission but did not respond to Paxlovid was experiencing drug interactions.  Patient was treated with Rocephin and steroids and was discharged on doxycycline.  She is completing antibiotics and is currently taking dexamethasone.  She notes that her blood sugars have gone up and has restarted using her Premeal insulin.  She also notes weakness but denies any difficulty breathing or worsening cough.  No fever or chills reported.  She has a follow-up scheduled with her PCP for posthospital visit.

## 2022-06-12 DIAGNOSIS — U071 COVID-19: Secondary | ICD-10-CM | POA: Diagnosis not present

## 2022-06-12 DIAGNOSIS — E1165 Type 2 diabetes mellitus with hyperglycemia: Secondary | ICD-10-CM | POA: Diagnosis not present

## 2022-06-12 DIAGNOSIS — J189 Pneumonia, unspecified organism: Secondary | ICD-10-CM | POA: Diagnosis not present

## 2022-06-12 DIAGNOSIS — Z09 Encounter for follow-up examination after completed treatment for conditions other than malignant neoplasm: Secondary | ICD-10-CM | POA: Diagnosis not present

## 2022-06-20 DIAGNOSIS — M545 Low back pain, unspecified: Secondary | ICD-10-CM | POA: Diagnosis not present

## 2022-06-21 ENCOUNTER — Other Ambulatory Visit: Payer: Self-pay

## 2022-06-21 DIAGNOSIS — E119 Type 2 diabetes mellitus without complications: Secondary | ICD-10-CM

## 2022-06-21 MED ORDER — LANTUS SOLOSTAR 100 UNIT/ML ~~LOC~~ SOPN: PEN_INJECTOR | SUBCUTANEOUS | 2 refills | Status: DC

## 2022-06-25 ENCOUNTER — Encounter (INDEPENDENT_AMBULATORY_CARE_PROVIDER_SITE_OTHER): Payer: Self-pay | Admitting: Family Medicine

## 2022-06-25 ENCOUNTER — Ambulatory Visit (INDEPENDENT_AMBULATORY_CARE_PROVIDER_SITE_OTHER): Payer: Medicare Other | Admitting: Family Medicine

## 2022-06-25 VITALS — BP 136/79 | HR 97 | Temp 97.8°F | Ht 60.0 in | Wt 178.0 lb

## 2022-06-25 DIAGNOSIS — F319 Bipolar disorder, unspecified: Secondary | ICD-10-CM

## 2022-06-25 DIAGNOSIS — Z6834 Body mass index (BMI) 34.0-34.9, adult: Secondary | ICD-10-CM

## 2022-06-25 DIAGNOSIS — Z794 Long term (current) use of insulin: Secondary | ICD-10-CM | POA: Diagnosis not present

## 2022-06-25 DIAGNOSIS — E1165 Type 2 diabetes mellitus with hyperglycemia: Secondary | ICD-10-CM | POA: Diagnosis not present

## 2022-06-25 DIAGNOSIS — E669 Obesity, unspecified: Secondary | ICD-10-CM

## 2022-06-25 NOTE — Progress Notes (Signed)
Chief Complaint:   OBESITY [name] [name] is here to discuss her progress with her obesity treatment plan along with follow-up of her obesity related diagnoses. [name] [name] is on the Category 2 Plan and states she is following her eating plan approximately 50% of the time. [name] [name] states she is not exercising.   Today's visit was #: 4 Starting weight: 191 lbs Starting date: 05/06/2022 Today's weight: 178 lbs Today's date: 06/25/2022 Total lbs lost to date: 13 lbs Total lbs lost since last in-office visit: 2 lbs  Interim History: Patient has a very difficult last month medically.  She is still feeling fairly fatigued after all this.  She was surprised she lost any weight as she felt she was less compliant since having COVID.  She ha been doing quite a bit of the power bowls and snacks of yogurt and blueberries and cottage cheese.  She doesn't think she is getting 6-8 oz of meat in at supper.  She has her granddaughter's wedding in the next few weeks and her grandson is graduating from high school and her other grandson is graduating from college.  She mentions these are all linked to celebrations and likely eating off plan.   Subjective:   1. Type 2 diabetes mellitus with hyperglycemia, with long-term current use of insulin Patient is on Lantus and lispro daily.  Patient sees Endocrine and has appointment tomorrow.  Patient could not tolerate Ozempic for metformin.  2. Bipolar 1 disorder Patient sees Corie Chiquito at [facility].  She voices she was started on Aricept and Namenda years ago, but she and Corie Chiquito questions whether she needs it.  Assessment/Plan:   1. Type 2 diabetes mellitus with hyperglycemia, with long-term current use of insulin Discussed asking endocrinology about Greggory Keen, may experience less nausea.  2. Bipolar 1 disorder Patient to recheck Corie Chiquito for further guidance.  3. Obesity with current BMI of 34.9 [name] [name] is currently in the action stage of  change. As such, her goal is to continue with weight loss efforts. She has agreed to the Category 2 Plan.   Exercise goals: No exercise has been prescribed at this time.  Behavioral modification strategies: increasing lean protein intake, meal planning and cooking strategies, keeping healthy foods in the home, and planning for success.  [name] [name] has agreed to follow-up with our clinic in 2-3 weeks. She was informed of the importance of frequent follow-up visits to maximize her success with intensive lifestyle modifications for her multiple health conditions.   Objective:   Blood pressure 136/79, pulse 97, temperature 97.8 F (36.6 C), height 5' (1.524 m), weight 178 lb (80.7 kg), SpO2 96 %. Body mass index is 34.76 kg/m.  General: Cooperative, alert, well developed, in no acute distress. HEENT: Conjunctivae and lids unremarkable. Cardiovascular: Regular rhythm.  Lungs: Normal work of breathing. Neurologic: No focal deficits.   Lab Results  Component Value Date   CREATININE 0.86 06/05/2022   BUN 13 06/05/2022   NA 141 06/05/2022   K 4.5 06/05/2022   CL 109 06/05/2022   CO2 25 06/05/2022   Lab Results  Component Value Date   ALT 22 06/03/2022   AST 19 06/03/2022   ALKPHOS 78 06/03/2022   BILITOT 0.3 06/03/2022   Lab Results  Component Value Date   HGBA1C 7.2 (H) 05/06/2022   HGBA1C 6.8 (H) 12/26/2021   HGBA1C 6.8 (H) 10/06/2021   HGBA1C 6.7 (A) 07/16/2021   HGBA1C 7.7 (H) 03/25/2021   No results found for: "INSULIN" Lab Results  Component Value Date   TSH 0.790 05/06/2022   Lab Results  Component Value Date   CHOL 159 05/06/2022   HDL 58 05/06/2022   LDLCALC 72 05/06/2022   LDLDIRECT 84.0 12/26/2021   TRIG 175 (H) 05/06/2022   CHOLHDL 3 12/26/2021   Lab Results  Component Value Date   VD25OH 37.1 05/06/2022   VD25OH 21 (L) 07/04/2012   Lab Results  Component Value Date   WBC 6.4 06/05/2022   HGB 13.2 06/05/2022   HCT 42.9 06/05/2022   MCV 93.1  06/05/2022   PLT 169 06/05/2022   Lab Results  Component Value Date   FERRITIN 43 07/06/2015   Attestation Statements:   Reviewed by clinician on day of visit: allergies, medications, problem list, medical history, surgical history, family history, social history, and previous encounter notes.  I, Malcolm Metro, RMA, am acting as transcriptionist for Reuben Likes, MD.  I have reviewed the above documentation for accuracy and completeness, and I agree with the above. - Reuben Likes, MD

## 2022-06-26 ENCOUNTER — Ambulatory Visit (INDEPENDENT_AMBULATORY_CARE_PROVIDER_SITE_OTHER): Payer: Medicare Other | Admitting: Endocrinology

## 2022-06-26 ENCOUNTER — Encounter: Payer: Self-pay | Admitting: Endocrinology

## 2022-06-26 VITALS — BP 124/78 | HR 95 | Ht 60.0 in | Wt 182.0 lb

## 2022-06-26 DIAGNOSIS — E039 Hypothyroidism, unspecified: Secondary | ICD-10-CM

## 2022-06-26 DIAGNOSIS — E1165 Type 2 diabetes mellitus with hyperglycemia: Secondary | ICD-10-CM | POA: Diagnosis not present

## 2022-06-26 DIAGNOSIS — Z794 Long term (current) use of insulin: Secondary | ICD-10-CM | POA: Diagnosis not present

## 2022-06-26 DIAGNOSIS — E782 Mixed hyperlipidemia: Secondary | ICD-10-CM

## 2022-06-26 NOTE — Progress Notes (Unsigned)
Patient ID: [name] [name], female   DOB: 26-Nov-1945, 77 y.o.   MRN: [id number]            Reason for Appointment: Follow-up    History of Present Illness:          Date of diagnosis of type 2 diabetes mellitus: ?  2014        Background history:   She thinks her blood sugar was 400 at the time of diagnosis but no detailed records of this are available She did have an A1c of 10.6 done in 2014 and was probably given Lantus for some time initially Apparently she was treated with various medications including metformin, Janumet and Tradgenta. Her blood sugars had improved and A1c in 2015 was down to 6.2 She tends to have diarrhea with metformin and Janumet and also she thinks it causes dry mouth Most likely Janumet was stopped in 06/2015 Glipizide was started in 8/17 when blood sugars were higher and A1c was 9%  Recent history:   INSULIN regimen is:   Lantus 70 units daily at 5 pm, Humalog 18-18units at meals  Non-insulin hypoglycemic drugs the patient is taking are: Currently none  Her A1c is 6.8,   Current management, blood sugar patterns and problems identified:   She was told to take Humalog regardless of Premeal blood sugar but she is generally taking it only twice a day Frequently will still forget to take it before eating and may take it after the blood sugar goes up Blood sugars are slightly better compared to her last visit and 74% within target range compared to 69 However appears that she has mostly high readings overnight averaging about 160+; the last 2 mornings of blood sugars in the mornings have been closer to 100 No hypoglycemia and overall blood sugars are not rising consistently after meals but still has some meal spikes either at breakfast or lunch AVERAGE blood sugars after meals are mostly in the 170 range and somewhat lower at about 152 after dinner She is again complaining of feeling excessively hungry and will not be controlling portions or  carbohydrates Not able to lose weight as before She is not able to do any exercise   Side effects from medications have been:?  Diarrhea from metformin and Janumet, Jardiance caused yeast infections, abdominal discomfort from Ozempic  CGM use % of time   2-week average/GV   Time in range        %  % Time Above 180   % Time above 250   % Time Below 70      PRE-MEAL Fasting Lunch Dinner Bedtime Overall  Glucose range:       Averages:        POST-MEAL PC Breakfast PC Lunch PC Dinner  Glucose range:     Averages:        CGM use % of time 69  2-week average/GV 162/21  Time in range    74    %  % Time Above 180 25  % Time above 250 1  % Time Below 70 0    Typical meal intake: Breakfast at 9,  May be eggs and sausage and lunch is usually a sandwich   Dinner 7 pm               Dietician visit, most recent: 10/18                Weight history:   Wt Readings from Last 3  Encounters:  06/26/22 182 lb (82.6 kg)  06/25/22 178 lb (80.7 kg)  06/11/22 180 lb (81.6 kg)    Glycemic control:   Lab Results  Component Value Date   HGBA1C 7.2 (H) 05/06/2022   HGBA1C 6.8 (H) 12/26/2021   HGBA1C 6.8 (H) 10/06/2021   Lab Results  Component Value Date   MICROALBUR <0.7 12/26/2021   LDLCALC 72 05/06/2022   CREATININE 0.86 06/05/2022   Lab Results  Component Value Date   MICRALBCREAT 1.6 12/26/2021       Allergies as of 06/26/2022       Reactions   Codeine Anxiety, Other (See Comments)   Hallucinations, tolerates oxycodone    Procaine Hcl Palpitations   Epinephrine Palpitations, Other (See Comments)   Makes heart race at the dentist's   Metformin And Related Nausea Only   Ozempic (0.25 Or 0.5 Mg-dose) [semaglutide(0.25 Or 0.5mg -dos)] Nausea Only   Aspirin Nausea And Vomiting, Other (See Comments)   Burns the stomach   Augmentin [amoxicillin-pot Clavulanate] Diarrhea   Benadryl [diphenhydramine] Other (See Comments)   Per MD "inhibits potency of gabapentin, lithium  etc"   Diflucan [fluconazole] Other (See Comments)   Unknown reaction   Dilaudid [hydromorphone Hcl] Other (See Comments)   Migraines and nightmares    Hydromorphone Other (See Comments)   Reaction unconfirmed   Morphine Other (See Comments)   headache   Other Nausea And Vomiting, Other (See Comments)   Pt states that all -mycins cause N/V. (MACROLIDES)  Exposure to steroids MUST BE LIMITED (or, mental status change/manic behavior happens)   Prednisone    Long term steroid problems with lithium and blood sugar.    Tramadol Hcl Other (See Comments)   Made the patient "feel weird"   Macrobid [nitrofurantoin] Rash, Other (See Comments)   Rash around ankles   Sulfa Antibiotics Other (See Comments)   Unknown reaction        Medication List        Accurate as of June 26, 2022  3:22 PM. If you have any questions, ask your nurse or doctor.          Acetaminophen 500 MG capsule Take 1,000 mg by mouth every 6 (six) hours as needed for fever or pain.   albuterol 108 (90 Base) MCG/ACT inhaler Commonly known as: VENTOLIN HFA Inhale 1-2 puffs into the lungs every 6 (six) hours as needed for wheezing or shortness of breath.   ALPRAZolam 0.5 MG tablet Commonly known as: XANAX Take 1 tablet (0.5 mg total) by mouth 4 (four) times daily as needed for anxiety. What changed:  when to take this additional instructions   atorvastatin 40 MG tablet Commonly known as: LIPITOR Take 1 tablet (40 mg total) by mouth See admin instructions. Take 40 mg by mouth at 5 PM   b complex vitamins capsule Take 1 capsule by mouth daily.   BENEFIBER PO Take 4-5 g by mouth daily as needed (for constipation- mix as directed).   bismuth subsalicylate 262 MG/15ML suspension Commonly known as: PEPTO BISMOL Take 30 mLs by mouth every 6 (six) hours as needed.   calcium carbonate 750 MG chewable tablet Commonly known as: TUMS EX Chew 1 tablet by mouth daily.   cholecalciferol 25 MCG (1000 UNIT)  tablet Commonly known as: VITAMIN D3 Take 1,000-2,000 Units by mouth See admin instructions. Take 1,000 units by mouth in the morning and 2,000 units at 5 PM   Combivent Respimat 20-100 MCG/ACT Aers respimat Generic drug: Ipratropium-Albuterol Inhale  1 puff into the lungs in the morning, at noon, and at bedtime for 7 days, THEN 1 puff every 6 (six) hours as needed for wheezing. Start taking on: June 05, 2022   Contour Next Test test strip Generic drug: glucose blood USE TO TEST BLOOD SUGAR TWICE DAILY   cyclobenzaprine 5 MG tablet Commonly known as: FLEXERIL Take 5 mg by mouth 3 (three) times daily as needed for muscle spasms.   diclofenac 75 MG EC tablet Commonly known as: VOLTAREN Take 75 mg by mouth 2 (two) times daily.   diclofenac Sodium 1 % Gel Commonly known as: Voltaren Apply 4 g topically 4 (four) times daily. What changed:  when to take this reasons to take this   dicyclomine 20 MG tablet Commonly known as: BENTYL Take 1 tablet (20 mg total) by mouth 3 (three) times daily as needed for spasms.   divalproex 250 MG DR tablet Commonly known as: DEPAKOTE Take 1 tablet (250 mg total) by mouth 2 (two) times daily AND 3 tablets (750 mg total) at bedtime. What changed: See the new instructions.   donepezil 10 MG tablet Commonly known as: ARICEPT Take 1/2 tablet daily What changed:  how much to take how to take this when to take this additional instructions   ezetimibe 10 MG tablet Commonly known as: Zetia Take 1 tablet (10 mg total) by mouth every evening.   fluticasone 50 MCG/ACT nasal spray Commonly known as: FLONASE Place 1 spray into both nostrils 2 (two) times daily as needed for allergies or rhinitis.   gabapentin 600 MG tablet Commonly known as: NEURONTIN Take 600 mg by mouth See admin instructions. Take 600 mg by mouth in the morning, at 5 PM, and at bedtime   guaiFENesin-dextromethorphan 100-10 MG/5ML syrup Commonly known as: ROBITUSSIN DM Take  5 mLs by mouth every 4 (four) hours as needed for cough (chest congestion).   ibuprofen 800 MG tablet Commonly known as: ADVIL Take 800 mg by mouth every 8 (eight) hours as needed for moderate pain.   insulin lispro 100 UNIT/ML KwikPen Commonly known as: HumaLOG KwikPen ADMINISTER 12 UNITS UNDER THE SKIN BEFORE MEALS What changed:  how much to take how to take this when to take this additional instructions   ipratropium 0.06 % nasal spray Commonly known as: ATROVENT Place 2 sprays into both nostrils 4 (four) times daily as needed for rhinitis.   Lantus SoloStar 100 UNIT/ML Solostar Pen Generic drug: insulin glargine ADMINISTER 68 UNITS UNDER THE SKIN AT BEDTIME What changed: additional instructions   levothyroxine 75 MCG tablet Commonly known as: SYNTHROID Take 1 tablet (75 mcg total) by mouth daily before breakfast.   lidocaine 2 % solution Commonly known as: XYLOCAINE Use as directed 15 mLs in the mouth or throat as directed.   liothyronine 5 MCG tablet Commonly known as: CYTOMEL TAKE 1 TABLET BY MOUTH DAILY WITH LEVOTHYROXINE  TAKE BEFORE BREAKFAST What changed:  how much to take how to take this when to take this additional instructions   lithium carbonate 450 MG ER tablet Commonly known as: ESKALITH Take 1 tablet (450 mg total) by mouth at bedtime.   loperamide 2 MG capsule Commonly known as: IMODIUM Take 2 capsules (4 mg total) by mouth 3 (three) times daily as needed for diarrhea or loose stools.   loratadine 10 MG tablet Commonly known as: CLARITIN Take 10 mg by mouth daily as needed for allergies.   losartan 25 MG tablet Commonly known as: COZAAR Take 25  mg by mouth See admin instructions. Take 25 mg by mouth at 5 PM   memantine 10 MG tablet Commonly known as: NAMENDA TAKE 1/2 TABLET BY MOUTH EVERY EVENING FOR ONE WEEK, THEN STOP What changed:  how much to take how to take this when to take this additional instructions   Microlet Lancets  Misc TEST DAILY AS DIRECTED   nitroGLYCERIN 0.4 MG SL tablet Commonly known as: NITROSTAT Place 1 tablet (0.4 mg total) under the tongue every 5 (five) minutes as needed for chest pain.   ondansetron 4 MG tablet Commonly known as: ZOFRAN Take 1 tablet (4 mg total) by mouth every 6 (six) hours. What changed:  when to take this reasons to take this   oxyCODONE-acetaminophen 5-325 MG tablet Commonly known as: PERCOCET/ROXICET Take 1-2 tablets by mouth every 6 (six) hours as needed for severe pain.   pantoprazole 40 MG tablet Commonly known as: PROTONIX Take 1 tablet (40 mg total) by mouth daily at 6 (six) AM.   promethazine-dextromethorphan 6.25-15 MG/5ML syrup Commonly known as: PROMETHAZINE-DM Take 5 mLs by mouth every 6 (six) hours.   QUEtiapine 200 MG tablet Commonly known as: SEROQUEL TAKE 1 TAB PO q 5 pm and 1 tab po QHS What changed:  how much to take how to take this when to take this additional instructions   rOPINIRole 0.5 MG tablet Commonly known as: REQUIP Takes 4 tabs at 5 pm and 3 tabs at bedtime and 1-2 tabs prn What changed:  how much to take how to take this when to take this additional instructions   THERAFLU SEVERE COLD NIGHTTIME PO Take 1 packet by mouth every 6 (six) hours as needed (for cold-like symptoms).        Allergies:  Allergies  Allergen Reactions   Codeine Anxiety and Other (See Comments)    Hallucinations, tolerates oxycodone    Procaine Hcl Palpitations   Epinephrine Palpitations and Other (See Comments)    Makes heart race at the dentist's    Metformin And Related Nausea Only   Ozempic (0.25 Or 0.5 Mg-Dose) [Semaglutide(0.25 Or 0.5mg -Dos)] Nausea Only   Aspirin Nausea And Vomiting and Other (See Comments)    Burns the stomach   Augmentin [Amoxicillin-Pot Clavulanate] Diarrhea   Benadryl [Diphenhydramine] Other (See Comments)    Per MD "inhibits potency of gabapentin, lithium etc"   Diflucan [Fluconazole] Other (See  Comments)    Unknown reaction    Dilaudid [Hydromorphone Hcl] Other (See Comments)    Migraines and nightmares    Hydromorphone Other (See Comments)    Reaction unconfirmed   Morphine Other (See Comments)    headache   Other Nausea And Vomiting and Other (See Comments)    Pt states that all -mycins cause N/V. (MACROLIDES)   Exposure to steroids MUST BE LIMITED (or, mental status change/manic behavior happens)   Prednisone     Long term steroid problems with lithium and blood sugar.    Tramadol Hcl Other (See Comments)    Made the patient "feel weird"   Macrobid [Nitrofurantoin] Rash and Other (See Comments)    Rash around ankles   Sulfa Antibiotics Other (See Comments)    Unknown reaction    Past Medical History:  Diagnosis Date   Alcohol abuse    Anxiety    takes Valium daily as needed and Ativan daily   Arthritis    Bilateral hearing loss    Bipolar 1 disorder    takes Lithium nightly and Synthroid daily  CFS (chronic fatigue syndrome)    Chewing difficulty    Chronic back pain    DDD; "all over" (09/14/2017)   Colitis, ischemic 2012   Confusion    r/t meds   Constipation    Depression    takes Prozac daily and Bupspirone    Diabetes    Diverticulosis    Dyslipidemia    takes Crestor daily   Edema of both lower extremities    Fibromyalgia    Gastroparesis    Headache    "weekly" (09/14/2017)   Hepatic steatosis 06/18/2012   severe   Hyperlipidemia    Hypertension    Hypothyroidism    IBS (irritable bowel syndrome)    Ischemic colitis    Joint pain    Joint swelling    Lupus erythematosus tumidus    tumid-skin   Migraine    "1-2/yr; maybe" (09/14/2017)   Numbness    in right foot   Osteoarthritis    "all over" (09/14/2017)   Osteoarthritis cervical spine    Osteoarthritis of hand    bilateral   Pneumonia    "walking pneumonia several times; long time since the last time" (09/14/2017)   Restless leg syndrome    takes Requip nightly   Sciatica     Swallowing difficulty    Type II diabetes mellitus    Urinary frequency    Urinary leakage    Urinary urgency    Urinary, incontinence, stress female    Walking pneumonia    last time more than 60yrs ago    Past Surgical History:  Procedure Laterality Date   ABDOMINAL HYSTERECTOMY     "they left my ovaries"   APPENDECTOMY     BALLOON DILATION N/A 06/14/2020   Procedure: BALLOON DILATION;  Surgeon: Kerin Salen, MD;  Location: [facility];  Service: Gastroenterology;  Laterality: N/A;   BIOPSY  06/14/2020   Procedure: BIOPSY;  Surgeon: Kerin Salen, MD;  Location: [facility];  Service: Gastroenterology;;   CARDIAC CATHETERIZATION  09/14/2017   COLON RESECTION  05/2021   COLONOSCOPY     DENTAL SURGERY Left 10/2016   dental implant   DILATION AND CURETTAGE OF UTERUS  X 4   ESOPHAGOGASTRODUODENOSCOPY     ESOPHAGOGASTRODUODENOSCOPY (EGD) WITH PROPOFOL N/A 06/14/2020   Procedure: ESOPHAGOGASTRODUODENOSCOPY (EGD) WITH PROPOFOL;  Surgeon: Kerin Salen, MD;  Location: [facility];  Service: Gastroenterology;  Laterality: N/A;   FLEXIBLE SIGMOIDOSCOPY N/A 06/21/2012   Procedure: FLEXIBLE SIGMOIDOSCOPY;  Surgeon: Beverley Fiedler, MD;  Location: [facility];  Service: Gastroenterology;  Laterality: N/A;   JOINT REPLACEMENT     LEFT HEART CATH AND CORONARY ANGIOGRAPHY N/A 09/14/2017   Procedure: LEFT HEART CATH AND CORONARY ANGIOGRAPHY;  Surgeon: Lyn Records, MD;  Location: [facility];  Service: Cardiovascular;  Laterality: N/A;   SHOULDER ARTHROSCOPY Right    "shaved spurs off rotator cuff"   TONSILLECTOMY     TOTAL HIP ARTHROPLASTY Right 06/09/2013   Procedure: TOTAL HIP ARTHROPLASTY;  Surgeon: Nestor Lewandowsky, MD;  Location: [facility];  Service: Orthopedics;  Laterality: Right;   TOTAL KNEE ARTHROPLASTY Left 02/07/2019   Procedure: TOTAL KNEE ARTHROPLASTY;  Surgeon: Durene Romans, MD;  Location: [facility];  Service: Orthopedics;  Laterality: Left;  70 mins   TUBAL LIGATION     TUMOR  EXCISION Right 1968   angle of jaw; benign    Family History  Problem Relation Age of Onset   Drug abuse Mother    Alcohol abuse Mother  High Cholesterol Mother    Depression Mother    Anxiety disorder Mother    Bipolar disorder Mother    Obesity Mother    Alcohol abuse Father    Hypertension Father    High Cholesterol Father    CAD Brother    Hypertension Brother    Alcohol abuse Brother    Hypertension Brother    Hypertension Brother    Psoriasis Daughter    Arthritis Daughter        psoriatic arthritis    Alcohol abuse Grandchild     Social History:  reports that she has never smoked. She has never used smokeless tobacco. She reports that she does not currently use alcohol. She reports that she does not currently use drugs after having used the following drugs: Benzodiazepines. Frequency: 7.00 times per week.   Review of Systems    Lipid history: On Lipitor 40 mg   Triglycerides over 400 and now better LDL high previously and Zetia was added  Not Taking OTC fish oil as she forgot to order   Lab Results  Component Value Date   CHOL 159 05/06/2022   CHOL 168 12/26/2021   CHOL 257 (H) 10/06/2021   Lab Results  Component Value Date   HDL 58 05/06/2022   HDL 60.90 12/26/2021   HDL 56.70 10/06/2021   Lab Results  Component Value Date   LDLCALC 72 05/06/2022   LDLCALC 79 08/09/2020   Lab Results  Component Value Date   TRIG 175 (H) 05/06/2022   TRIG 234.0 (H) 12/26/2021   TRIG (H) 10/06/2021    406.0 Triglyceride is over 400; calculations on Lipids are invalid.   Lab Results  Component Value Date   CHOLHDL 3 12/26/2021   CHOLHDL 5 10/06/2021   CHOLHDL 3 08/09/2020   Lab Results  Component Value Date   LDLDIRECT 84.0 12/26/2021   LDLDIRECT 148.0 10/06/2021   LDLDIRECT 137.0 12/04/2019            ?  Hypertension: Currently only on 25 mg losartan  BP Readings from Last 3 Encounters:  06/26/22 124/78  06/25/22 136/79  06/11/22 138/86      Most recent foot exam: 6/21  THYROID: She previously had been on levothyroxine and Cytomel from her psychiatrist for several years with uncertain diagnosis She is taking levothyroxine 75 g, Also on liothyronine 5 mcg, previously T3 level was low when this was stopped  Also on lithium  Her TSH is consistently normal Free T3 and free T4 also normal  Labs as follows:  Lab Results  Component Value Date   TSH 0.790 05/06/2022   TSH 0.67 10/06/2021   TSH 0.925 07/06/2021   FREET4 0.76 10/06/2021   FREET4 0.68 07/06/2021   FREET4 0.93 03/25/2021   Lab Results  Component Value Date   T3FREE 2.8 10/06/2021   T3FREE 3.2 03/25/2021   T3FREE 2.2 (L) 12/24/2020   T3FREE 3.2 08/28/2019   T3FREE 2.7 04/26/2019    She has atypical depression on multiple drugs, has been on Seroquel for quite some time  THYROID nodule:  She was found to have a nodule in her thyroid isthmus in 8/20 Ultrasound done in 1/21 showed multinodular goiter with only 1 significant nodule   However nodule in the isthmus was biopsied on the recommendation of her PCP in 3/22 and that showed benign follicular nodule  She has had mild/variable hypercalcemia, last levels:  Lab Results  Component Value Date   PTH 35 10/25/2018  CALCIUM 10.1 06/05/2022   CAION 1.30 07/14/2016   PHOS 2.9 12/26/2010   She reportedly has GASTROPARESIS  Currently not on any treatment Taking medications as needed for nausea  She is on Namenda and Aricept for dementia  Physical Examination:  BP 124/78 (BP Location: Left Arm, Patient Position: Sitting, Cuff Size: Large)   Pulse 95   Ht 5' (1.524 m)   Wt 182 lb (82.6 kg)   SpO2 95%   BMI 35.54 kg/m         ASSESSMENT:  Diabetes type 2, on insulin  See history of present illness for detailed discussion of current diabetes management, blood sugar patterns, monitor download and problems identified  She is on 68 units Lantus insulin and also on mealtime insulin  without any non-insulin hypoglycemic drugs She is intolerant to GLP-1 drugs including [place]  Her A1c is at 7.2 as of 2/24 However recent blood sugars are higher as judged by her CGM She has not been seen regularly in follow-up  Lipids are better controlled with adding Zetia, still has high triglycerides  PLAN:   Increase Lantus to at least 76 units to get morning/overnight sugars consistently controlled She will likely need about 14 units to cover her evening meal unless it has no carbohydrates She can also reduce her breakfast or lunch dose to 14 units Humalog when eating smaller meals She will need to take this before starting to eat, not clear if this is feasible since she is not always supervised by her husband Discussed that she is not a good candidate for [facility] as suggested by her weight loss physician because of intolerance to all GLP-1 drugs  Today she was shown the options of using her V-go or the OmniPod insulin pumps and discussed in detail how this would work She would like to try the V-go pump for better compliance, likely reduction in insulin requirement and better control and she can be given a trial with a sample by the nurse educator Her husband will also check on the cost of the pump  Patient Instructions  76 Lantus  14 units at supper, 14-18 at other meals     Reather Littler 06/26/2022, 3:22 PM   Note: This office note was prepared with Dragon voice recognition system technology. Any transcriptional errors that result from this process are unintentional.

## 2022-06-26 NOTE — Patient Instructions (Signed)
76 Lantus  14 units at supper, 14-18 at other meals

## 2022-06-30 ENCOUNTER — Encounter: Payer: Self-pay | Admitting: Endocrinology

## 2022-06-30 ENCOUNTER — Ambulatory Visit (INDEPENDENT_AMBULATORY_CARE_PROVIDER_SITE_OTHER): Payer: Medicare Other | Admitting: Psychiatry

## 2022-06-30 ENCOUNTER — Encounter: Payer: Self-pay | Admitting: Psychiatry

## 2022-06-30 DIAGNOSIS — M47817 Spondylosis without myelopathy or radiculopathy, lumbosacral region: Secondary | ICD-10-CM | POA: Diagnosis not present

## 2022-06-30 DIAGNOSIS — Z79899 Other long term (current) drug therapy: Secondary | ICD-10-CM | POA: Diagnosis not present

## 2022-06-30 DIAGNOSIS — G8929 Other chronic pain: Secondary | ICD-10-CM | POA: Diagnosis not present

## 2022-06-30 DIAGNOSIS — F319 Bipolar disorder, unspecified: Secondary | ICD-10-CM

## 2022-06-30 DIAGNOSIS — F5101 Primary insomnia: Secondary | ICD-10-CM

## 2022-06-30 DIAGNOSIS — M5451 Vertebrogenic low back pain: Secondary | ICD-10-CM | POA: Diagnosis not present

## 2022-06-30 DIAGNOSIS — M41126 Adolescent idiopathic scoliosis, lumbar region: Secondary | ICD-10-CM | POA: Diagnosis not present

## 2022-06-30 DIAGNOSIS — F411 Generalized anxiety disorder: Secondary | ICD-10-CM

## 2022-06-30 DIAGNOSIS — M5136 Other intervertebral disc degeneration, lumbar region: Secondary | ICD-10-CM | POA: Diagnosis not present

## 2022-06-30 MED ORDER — LITHIUM CARBONATE ER 450 MG PO TBCR: 450.0000 mg | EXTENDED_RELEASE_TABLET | Freq: Every day | ORAL | 0 refills | Status: DC

## 2022-06-30 MED ORDER — ALPRAZOLAM 0.5 MG PO TABS: 0.5000 mg | ORAL_TABLET | Freq: Four times a day (QID) | ORAL | 2 refills | Status: DC | PRN

## 2022-06-30 MED ORDER — QUETIAPINE FUMARATE 200 MG PO TABS: 200.0000 mg | ORAL_TABLET | ORAL | 0 refills | Status: DC

## 2022-06-30 MED ORDER — DIVALPROEX SODIUM 250 MG PO DR TAB: 500.0000 mg | DELAYED_RELEASE_TABLET | Freq: Every day | ORAL | 0 refills | Status: DC
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
## 2022-07-02 ENCOUNTER — Emergency Department (HOSPITAL_BASED_OUTPATIENT_CLINIC_OR_DEPARTMENT_OTHER)
Admission: EM | Admit: 2022-07-02 | Discharge: 2022-07-02 | Disposition: A | Discharge status: Home / Self Care | Payer: Medicare Other | Attending: Emergency Medicine | Admitting: Emergency Medicine

## 2022-07-02 ENCOUNTER — Other Ambulatory Visit: Payer: Self-pay

## 2022-07-02 ENCOUNTER — Emergency Department (HOSPITAL_BASED_OUTPATIENT_CLINIC_OR_DEPARTMENT_OTHER): Payer: Medicare Other | Admitting: Radiology

## 2022-07-02 ENCOUNTER — Encounter (HOSPITAL_BASED_OUTPATIENT_CLINIC_OR_DEPARTMENT_OTHER): Payer: Self-pay | Admitting: Emergency Medicine

## 2022-07-02 DIAGNOSIS — W2209XA Striking against other stationary object, initial encounter: Secondary | ICD-10-CM | POA: Insufficient documentation

## 2022-07-02 DIAGNOSIS — Y9301 Activity, walking, marching and hiking: Secondary | ICD-10-CM | POA: Insufficient documentation

## 2022-07-02 DIAGNOSIS — Z794 Long term (current) use of insulin: Secondary | ICD-10-CM | POA: Insufficient documentation

## 2022-07-02 DIAGNOSIS — E119 Type 2 diabetes mellitus without complications: Secondary | ICD-10-CM | POA: Insufficient documentation

## 2022-07-02 DIAGNOSIS — S9032XA Contusion of left foot, initial encounter: Secondary | ICD-10-CM | POA: Diagnosis not present

## 2022-07-02 DIAGNOSIS — S99922A Unspecified injury of left foot, initial encounter: Secondary | ICD-10-CM | POA: Diagnosis present

## 2022-07-02 DIAGNOSIS — S92535A Nondisplaced fracture of distal phalanx of left lesser toe(s), initial encounter for closed fracture: Secondary | ICD-10-CM | POA: Diagnosis not present

## 2022-07-02 NOTE — Discharge Instructions (Signed)
Please keep your foot elevated, use acetaminophen, use cold therapy.  Do not apply ice directly to the area.  Please examine the foot daily and make sure there are no sores.  Follow-up with your doctor next week.

## 2022-07-02 NOTE — ED Provider Notes (Signed)
[place] EMERGENCY DEPARTMENT AT [facility] Provider Note   CSN: [id number] Arrival date & time: 07/02/22  0805     History  Chief Complaint  Patient presents with   Foot Injury    WINSLOW VERRILL is a 77 y.o. female.  HPI 78 year old female history of diabetes presents today complaining of pain and bruising to her left toe area.  She does not have any definite injury but states that she noted pain at the bruising and swelling yesterday when she took her shoe off.  Today she was unable to put her shoe back on due to the pain.  She denies any falls.  She has been walking with her cane without difficulty.      Home Medications Prior to Admission medications   Medication Sig Start Date End Date Taking? Authorizing Provider  Acetaminophen 500 MG capsule Take 1,000 mg by mouth every 6 (six) hours as needed for fever or pain.    [provider]  albuterol (VENTOLIN HFA) 108 (90 Base) MCG/ACT inhaler Inhale 1-2 puffs into the lungs every 6 (six) hours as needed for wheezing or shortness of breath. Patient not taking: Reported on 06/30/2022    [provider]  ALPRAZolam Prudy Feeler) 0.5 MG tablet Take 1 tablet (0.5 mg total) by mouth 4 (four) times daily as needed for anxiety. 07/22/22   Corie Chiquito, PMHNP  atorvastatin (LIPITOR) 40 MG tablet Take 1 tablet (40 mg total) by mouth See admin instructions. Take 40 mg by mouth at 5 PM 06/12/22   Rodolph Bong, MD  b complex vitamins capsule Take 1 capsule by mouth daily.    [provider]  bismuth subsalicylate (PEPTO BISMOL) 262 MG/15ML suspension Take 30 mLs by mouth every 6 (six) hours as needed.    [provider]  calcium carbonate (TUMS EX) 750 MG chewable tablet Chew 1 tablet by mouth daily.    [provider]  cholecalciferol (VITAMIN D3) 25 MCG (1000 UNIT) tablet Take 1,000-2,000 Units by mouth See admin instructions. Take 1,000 units by mouth in the morning and 2,000 units at  5 PM    [provider]  CONTOUR NEXT TEST test strip USE TO TEST BLOOD SUGAR TWICE DAILY 08/01/21   Reather Littler, MD  cyclobenzaprine (FLEXERIL) 5 MG tablet Take 5 mg by mouth 3 (three) times daily as needed for muscle spasms.    [provider]  diclofenac (VOLTAREN) 75 MG EC tablet Take 75 mg by mouth 2 (two) times daily. Patient not taking: Reported on 06/30/2022 05/11/22   [provider]  diclofenac Sodium (VOLTAREN) 1 % GEL Apply 4 g topically 4 (four) times daily. Patient taking differently: Apply 4 g topically 4 (four) times daily as needed (for pain- affected areas). 05/21/22   Palumbo, April, MD  dicyclomine (BENTYL) 20 MG tablet Take 1 tablet (20 mg total) by mouth 3 (three) times daily as needed for spasms. 06/17/21   Burnadette Pop, MD  divalproex (DEPAKOTE) 250 MG DR tablet Take 2 tablets (500 mg total) by mouth at bedtime. 06/30/22   Corie Chiquito, PMHNP  ezetimibe (ZETIA) 10 MG tablet Take 1 tablet (10 mg total) by mouth every evening. 06/12/22   Rodolph Bong, MD  fluticasone (FLONASE) 50 MCG/ACT nasal spray Place 1 spray into both nostrils 2 (two) times daily as needed for allergies or rhinitis.    [provider]  gabapentin (NEURONTIN) 600 MG tablet Take 600 mg by mouth 2 (two) times daily. Take  600 mg by mouth in the morning, at 5 PM, and at bedtime 05/12/19   [provider]  insulin glargine (LANTUS SOLOSTAR) 100 UNIT/ML Solostar Pen ADMINISTER 68 UNITS UNDER THE SKIN AT BEDTIME Patient taking differently: ADMINISTER 70 UNITS UNDER THE SKIN AT BEDTIME 06/21/22   Reather Littler, MD  insulin lispro (HUMALOG KWIKPEN) 100 UNIT/ML KwikPen ADMINISTER 12 UNITS UNDER THE SKIN BEFORE MEALS Patient taking differently: Inject 18 Units into the skin 2 (two) times daily before a meal. 03/13/22   Reather Littler, MD  ipratropium (ATROVENT) 0.06 % nasal spray Place 2 sprays into both nostrils 4 (four) times daily as needed for rhinitis. Patient not taking:  Reported on 06/30/2022 06/01/22   [provider]  Ipratropium-Albuterol (COMBIVENT RESPIMAT) 20-100 MCG/ACT AERS respimat Inhale 1 puff into the lungs in the morning, at noon, and at bedtime for 7 days, THEN 1 puff every 6 (six) hours as needed for wheezing. Patient not taking: Reported on 06/30/2022 06/05/22 07/12/22  Rodolph Bong, MD  levothyroxine (SYNTHROID) 75 MCG tablet Take 1 tablet (75 mcg total) by mouth daily before breakfast. 03/26/22   Reather Littler, MD  lidocaine (XYLOCAINE) 2 % solution Use as directed 15 mLs in the mouth or throat as directed. 06/01/22   [provider]  liothyronine (CYTOMEL) 5 MCG tablet TAKE 1 TABLET BY MOUTH DAILY WITH LEVOTHYROXINE  TAKE BEFORE BREAKFAST Patient taking differently: Take 5 mcg by mouth daily before breakfast. 03/26/22   Reather Littler, MD  lithium carbonate (ESKALITH) 450 MG ER tablet Take 1 tablet (450 mg total) by mouth at bedtime. 06/30/22   Corie Chiquito, PMHNP  loperamide (IMODIUM) 2 MG capsule Take 2 capsules (4 mg total) by mouth 3 (three) times daily as needed for diarrhea or loose stools. 11/12/21   Danford, Earl Lites, MD  loratadine (CLARITIN) 10 MG tablet Take 10 mg by mouth daily as needed for allergies.    [provider]  losartan (COZAAR) 25 MG tablet Take 25 mg by mouth See admin instructions. Take 25 mg by mouth at 5 PM 08/31/19   [provider]  Microlet Lancets MISC TEST DAILY AS DIRECTED 08/01/21   Reather Littler, MD  nitroGLYCERIN (NITROSTAT) 0.4 MG SL tablet Place 1 tablet (0.4 mg total) under the tongue every 5 (five) minutes as needed for chest pain. 09/14/17   Robbie Lis M, PA-C  ondansetron (ZOFRAN) 4 MG tablet Take 1 tablet (4 mg total) by mouth every 6 (six) hours. Patient taking differently: Take 4 mg by mouth every 6 (six) hours as needed for nausea or vomiting. 09/20/21   Curatolo, Adam, DO  oxyCODONE-acetaminophen (PERCOCET/ROXICET) 5-325 MG tablet Take 1-2 tablets by mouth every 6  (six) hours as needed for severe pain. 03/31/22   Ernie Avena, MD  pantoprazole (PROTONIX) 40 MG tablet Take 1 tablet (40 mg total) by mouth daily at 6 (six) AM. Patient not taking: Reported on 06/30/2022 06/06/22   Rodolph Bong, MD  QUEtiapine (SEROQUEL) 200 MG tablet Take 1 tablet (200 mg total) by mouth See admin instructions. Take 200 mg by mouth at 5 PM and at bedtime 06/30/22 09/28/22  Corie Chiquito, PMHNP  rOPINIRole (REQUIP) 0.5 MG tablet Takes 4 tabs at 5 pm and 3 tabs at bedtime and 1-2 tabs prn Patient taking differently: Take 0.5-2 mg by mouth See admin instructions. Take 2 mg by mouth at 5 PM and 1.5 mg at bedtime- may take an additional 0.5 mg one to two times a day  as needed/as directed 05/18/22   Corie Chiquito, PMHNP  Wheat Dextrin (BENEFIBER PO) Take 4-5 g by mouth daily as needed (for constipation- mix as directed).    [provider]      Allergies    Codeine, Procaine hcl, Epinephrine, Metformin and related, Ozempic (0.25 or 0.5 mg-dose) [semaglutide(0.25 or 0.5mg -dos)], Aspirin, Augmentin [amoxicillin-pot clavulanate], Benadryl [diphenhydramine], Diflucan [fluconazole], Dilaudid [hydromorphone hcl], Hydromorphone, Morphine, Other, Prednisone, Tramadol hcl, Macrobid [nitrofurantoin], and Sulfa antibiotics    Review of Systems   Review of Systems  Physical Exam Updated Vital Signs BP 130/62 (BP Location: Right Arm)   Pulse 78   Temp 98.1 F (36.7 C) (Oral)   Resp 20   Ht 1.524 m (5')   Wt 80.7 kg   SpO2 98%   BMI 34.76 kg/m  Physical Exam Vitals and nursing note reviewed.  Constitutional:      General: She is not in acute distress. HENT:     Head: Normocephalic and atraumatic.     Right Ear: External ear normal.     Left Ear: External ear normal.     Nose: Nose normal.     Mouth/Throat:     Pharynx: Oropharynx is clear.  Eyes:     Pupils: Pupils are equal, round, and reactive to light.  Cardiovascular:     Rate and Rhythm: Normal rate.      Pulses: Normal pulses.  Pulmonary:     Effort: Pulmonary effort is normal.  Abdominal:     Palpations: Abdomen is soft.  Musculoskeletal:     Cervical back: Normal range of motion.     Comments: Left toe with some to redness of the pinky toe.  Some bruising noted at the base of the fifth toe and dorsal aspect of the foot. No open wounds Pulses intact Full active range of motion ankle and toes.  Skin:    General: Skin is warm and dry.     Capillary Refill: Capillary refill takes less than 2 seconds.  Neurological:     General: No focal deficit present.     Mental Status: She is alert.  Psychiatric:        Mood and Affect: Mood normal.        Behavior: Behavior normal.     ED Results / Procedures / Treatments   Labs (all labs ordered are listed, but only abnormal results are displayed) Labs Reviewed - No data to display  EKG None  Radiology DG Foot Complete Left  Result Date: 07/02/2022 CLINICAL DATA:  Pain and bruising to the fifth digit. EXAM: LEFT FOOT - COMPLETE 3+ VIEW COMPARISON:  None Available. FINDINGS: There is suspicion for a nondisplaced, intra-articular fracture involving the medial base of the fifth distal phalanx. Skinfold artifact to overlies this area somewhat limiting assessment. The remaining portions of the fifth digit and fifth metatarsal bone are unremarkable. No additional fractures. Hallux valgus deformity identified with moderate degenerative changes at the first MTP joint. Small plantar heel spur. IMPRESSION: 1. Suspect nondisplaced, intra-articular fracture involving the medial base of the fifth distal phalanx. Correlate for any focal tenderness over the fifth distal phalanx. 2. Hallux valgus deformity and moderate first MTP joint osteoarthritis. Electronically Signed   By: Signa Kell M.D.   On: 07/02/2022 08:41    Procedures Procedures    Medications Ordered in ED Medications - No data to display  ED Course/ Medical Decision Making/ A&P  Medical Decision Making Amount and/or Complexity of Data Reviewed Radiology: ordered.    77 year old female with bruising pain left foot.  Patient with history of diabetes. Differential diagnosis includes but is not limited to traumatic injury with bruising, diabetic foot issues. Based on exam with bruising most consistent with traumatic injury.  Patient with diabetes which would likely contribute to her not realizing she had an injury initially.  X-Lyly Canizales shows a fracture at the distal phalanx. Plan soft tissue, ambulating as tolerated.  Close follow-up with primary care doctor.       Final Clinical Impression(s) / ED Diagnoses Final diagnoses:  Closed nondisplaced fracture of distal phalanx of lesser toe of left foot, initial encounter    Rx / DC Orders ED Discharge Orders     None         Margarita Grizzle, MD 07/02/22 [phone]

## 2022-07-02 NOTE — ED Triage Notes (Signed)
Pt arrives pov, to triage in wheelchair, c/o LT pinky toe and foot pain after hitting on wall yesterday. Bruising noted

## 2022-07-02 NOTE — ED Notes (Signed)
X-ray at bedside

## 2022-07-03 DIAGNOSIS — M79675 Pain in left toe(s): Secondary | ICD-10-CM | POA: Diagnosis not present

## 2022-07-05 DIAGNOSIS — L720 Epidermal cyst: Secondary | ICD-10-CM | POA: Diagnosis not present

## 2022-07-06 ENCOUNTER — Other Ambulatory Visit: Payer: Self-pay | Admitting: Endocrinology

## 2022-07-06 DIAGNOSIS — E1165 Type 2 diabetes mellitus with hyperglycemia: Secondary | ICD-10-CM

## 2022-07-06 DIAGNOSIS — M47816 Spondylosis without myelopathy or radiculopathy, lumbar region: Secondary | ICD-10-CM | POA: Diagnosis not present

## 2022-07-07 ENCOUNTER — Telehealth: Payer: Self-pay

## 2022-07-07 NOTE — Telephone Encounter (Signed)
Patient LVM stating [employer] needs order for her sensors. Patient states she is currently out of sensors.   Order sent to [facility].

## 2022-07-08 ENCOUNTER — Encounter (INDEPENDENT_AMBULATORY_CARE_PROVIDER_SITE_OTHER): Payer: Self-pay | Admitting: Internal Medicine

## 2022-07-08 ENCOUNTER — Other Ambulatory Visit (INDEPENDENT_AMBULATORY_CARE_PROVIDER_SITE_OTHER): Payer: Self-pay | Admitting: Internal Medicine

## 2022-07-08 ENCOUNTER — Ambulatory Visit (INDEPENDENT_AMBULATORY_CARE_PROVIDER_SITE_OTHER): Payer: Medicare Other | Admitting: Internal Medicine

## 2022-07-08 VITALS — BP 117/71 | HR 88 | Temp 97.9°F | Ht 60.0 in | Wt 181.0 lb

## 2022-07-08 DIAGNOSIS — F319 Bipolar disorder, unspecified: Secondary | ICD-10-CM | POA: Diagnosis not present

## 2022-07-08 DIAGNOSIS — E1165 Type 2 diabetes mellitus with hyperglycemia: Secondary | ICD-10-CM

## 2022-07-08 DIAGNOSIS — Z6834 Body mass index (BMI) 34.0-34.9, adult: Secondary | ICD-10-CM

## 2022-07-08 DIAGNOSIS — Z794 Long term (current) use of insulin: Secondary | ICD-10-CM

## 2022-07-08 DIAGNOSIS — E669 Obesity, unspecified: Secondary | ICD-10-CM

## 2022-07-08 DIAGNOSIS — Z7984 Long term (current) use of oral hypoglycemic drugs: Secondary | ICD-10-CM

## 2022-07-08 MED ORDER — METFORMIN HCL ER 500 MG PO TB24: 500.0000 mg | ORAL_TABLET | Freq: Every day | ORAL | 0 refills | Status: DC

## 2022-07-08 NOTE — Progress Notes (Addendum)
Office: [phone]  /  Fax: [phone]  WEIGHT SUMMARY AND BIOMETRICS  Vitals Temp: 97.9 F (36.6 C) BP: 117/71 Pulse Rate: 88 SpO2: 95 %   Anthropometric Measurements Height: 5' (1.524 m) Weight: 181 lb (82.1 kg) BMI (Calculated): 35.35 Weight at Last Visit: 178 lb Weight Gained Since Last Visit: 3 lb Starting Weight: 191 lb Total Weight Loss (lbs): 10 lb (4.536 kg)   No data recorded  No data recorded Today's Visit #: 5  Starting Date: 05/06/22   HPI  Chief Complaint: OBESITY  [name] [name] is here to discuss her progress with her obesity treatment plan. She is on the the Category 2 Plan and states she is following her eating plan approximately 50 % of the time. She states she is not exercising.  Interval History:  Since last office visit she has gained 3 lbs. She reports  declining adherence. Has been eating more palatable foods. Reports problems with appetite and hunger signals.  Denies problems with satiety and satiation.  Reports problems with eating patterns and portion control.  Reports abnormal cravings. Denies feeling deprived or restricted.  She is working with behavioral health to reduce psychotropic medication.   Barriers identified: having difficulty preparing healthy meals, having difficulty with meal prep and planning, and exposure to enticing environments and or relationships. Had SE to GLP-1, SGLT-2 in past.  Pharmacotherapy for weight loss: She is currently taking no anti-obesity medication.    ASSESSMENT AND PLAN  TREATMENT PLAN FOR OBESITY:  Recommended Dietary Goals  [name] [name] is currently in the action stage of change. As such, her goal is to continue weight management plan. She has agreed to: continue current plan and work on reimplementation.  Behavioral Intervention  We discussed the following Behavioral Modification Strategies today: increasing lean protein intake, decreasing simple carbohydrates , increasing vegetables,  increasing lower glycemic fruits, increasing water intake, work on meal planning and preparation, decreasing eating out or consumption of processed foods, and making healthy choices when eating convenient foods, continue to practice mindfulness when eating, planning for success, and better snacking choices.  Additional resources provided today: None  Recommended Physical Activity Goals  [name] [name] has been advised to work up to 150 minutes of moderate intensity aerobic activity a week and strengthening exercises 2-3 times per week for cardiovascular health, weight loss maintenance and preservation of muscle mass.   She has agreed to :  Think about ways to increase physical activity  Pharmacotherapy We discussed various medication options to help [name] [name] with her weight loss efforts and we both agreed to : continue with nutritional and behavioral strategies and   . Had side effects to semaglutide and SGLT-2 in past.   ASSOCIATED CONDITIONS ADDRESSED TODAY  Type 2 diabetes mellitus with hyperglycemia, with long-term current use of insulin ([name]) Assessment & Plan: She has very limited treatment options.  I reviewed most recent consultation from endocrinology.  I would like to try her on metformin extended release in the evening.  She has changed some of her dietary patterns and may not experience side effects that she did in the past which were mild nausea.  This medication will also help offset some of the weight gain associated with psychotropic medication.  Orders: -     metFORMIN HCl ER; Take 1 tablet (500 mg total) by mouth daily with supper.  Dispense: 30 tablet; Refill: 0  Bipolar 1 disorder ([name]) Assessment & Plan: Stable patient will continue to work with mental health team to reduce psychotropic medication.This will help  with weight loss efforts as lon as not met by deterioration in mental health   Obesity with current BMI of 34.9     PHYSICAL EXAM:  Blood pressure 117/71,  pulse 88, temperature 97.9 F (36.6 C), height 5' (1.524 m), weight 181 lb (82.1 kg), SpO2 95 %. Body mass index is 35.35 kg/m.  General: She is overweight, cooperative, alert, well developed, and in no acute distress. PSYCH: Has normal mood, affect and thought process.   HEENT: EOMI, sclerae are anicteric. Lungs: Normal breathing effort, no conversational dyspnea. Extremities: No edema.  Neurologic: No gross sensory or motor deficits. No tremors or fasciculations noted.    DIAGNOSTIC DATA REVIEWED:  BMET    Component Value Date/Time   NA 141 06/05/2022 0501   NA 140 01/27/2021 1526   K 4.5 06/05/2022 0501   CL 109 06/05/2022 0501   CO2 25 06/05/2022 0501   GLUCOSE 275 (H) 06/05/2022 0501   BUN 13 06/05/2022 0501   BUN 8 01/27/2021 1526   CREATININE 0.86 06/05/2022 0501   CREATININE 0.85 10/09/2019 1052   CALCIUM 10.1 06/05/2022 0501   GFRNONAA >60 06/05/2022 0501   GFRNONAA 57 (L) 11/17/2017 0000   GFRAA >60 12/10/2019 0657   GFRAA 67 11/17/2017 0000   Lab Results  Component Value Date   HGBA1C 7.2 (H) 05/06/2022   HGBA1C 5.5 12/25/2010   No results found for: "INSULIN" Lab Results  Component Value Date   TSH 0.790 05/06/2022   CBC    Component Value Date/Time   WBC 6.4 06/05/2022 0501   RBC 4.61 06/05/2022 0501   HGB 13.2 06/05/2022 0501   HGB 14.0 01/27/2021 1526   HCT 42.9 06/05/2022 0501   HCT 41.5 01/27/2021 1526   PLT 169 06/05/2022 0501   PLT 255 01/27/2021 1526   MCV 93.1 06/05/2022 0501   MCV 87 01/27/2021 1526   MCH 28.6 06/05/2022 0501   MCHC 30.8 06/05/2022 0501   RDW 13.5 06/05/2022 0501   RDW 13.6 01/27/2021 1526   Iron Studies    Component Value Date/Time   FERRITIN 43 07/06/2015 0459   Lipid Panel     Component Value Date/Time   CHOL 159 05/06/2022 0948   TRIG 175 (H) 05/06/2022 0948   HDL 58 05/06/2022 0948   CHOLHDL 3 12/26/2021 1004   VLDL 46.8 (H) 12/26/2021 1004   LDLCALC 72 05/06/2022 0948   LDLDIRECT 84.0 12/26/2021  1004   Hepatic Function Panel     Component Value Date/Time   PROT 6.6 06/03/2022 2106   PROT 6.4 01/27/2021 1526   ALBUMIN 3.9 06/03/2022 2106   ALBUMIN 4.4 01/27/2021 1526   AST 19 06/03/2022 2106   ALT 22 06/03/2022 2106   ALKPHOS 78 06/03/2022 2106   BILITOT 0.3 06/03/2022 2106   BILITOT 0.4 01/27/2021 1526   BILIDIR 0.2 06/14/2021 0406   IBILI 0.5 06/14/2021 0406      Component Value Date/Time   TSH 0.790 05/06/2022 0948   Nutritional Lab Results  Component Value Date   VD25OH 37.1 05/06/2022   VD25OH 21 (L) 07/04/2012     Return in about 2 weeks (around 07/22/2022) for For Weight Mangement with Dr. Rikki Spearing.Marland Kitchen She was informed of the importance of frequent follow up visits to maximize her success with intensive lifestyle modifications for her multiple health conditions.   ATTESTASTION STATEMENTS:  Reviewed by clinician on day of visit: allergies, medications, problem list, medical history, surgical history, family history, social history, and previous encounter notes.  Thomes Dinning, MD

## 2022-07-08 NOTE — Assessment & Plan Note (Signed)
Stable patient will continue to work with mental health team to reduce psychotropic medication.This will help with weight loss efforts as lon as not met by deterioration in mental health

## 2022-07-08 NOTE — Assessment & Plan Note (Signed)
She has very limited treatment options.  I reviewed most recent consultation from endocrinology.  I would like to try her on metformin extended release in the evening.  She has changed some of her dietary patterns and may not experience side effects that she did in the past which were mild nausea.  This medication will also help offset some of the weight gain associated with psychotropic medication.

## 2022-07-09 NOTE — Telephone Encounter (Signed)
Patient LVM asking about sensors.  Spoke with patient and advised we sent rx as requested. Also received forms from [employer] on 07/07/22, these were completed today and faxed back with OV notes. Patient aware this has been completed. She will contact them for info on expected shipping.   Nothing further needed at this time.

## 2022-07-15 ENCOUNTER — Emergency Department (HOSPITAL_BASED_OUTPATIENT_CLINIC_OR_DEPARTMENT_OTHER): Payer: Medicare Other

## 2022-07-15 ENCOUNTER — Emergency Department (HOSPITAL_BASED_OUTPATIENT_CLINIC_OR_DEPARTMENT_OTHER)
Admission: EM | Admit: 2022-07-15 | Discharge: 2022-07-15 | Disposition: A | Discharge status: Home / Self Care | Payer: Medicare Other | Attending: Emergency Medicine | Admitting: Emergency Medicine

## 2022-07-15 ENCOUNTER — Encounter (HOSPITAL_BASED_OUTPATIENT_CLINIC_OR_DEPARTMENT_OTHER): Payer: Self-pay | Admitting: Emergency Medicine

## 2022-07-15 ENCOUNTER — Other Ambulatory Visit: Payer: Self-pay

## 2022-07-15 DIAGNOSIS — N390 Urinary tract infection, site not specified: Secondary | ICD-10-CM | POA: Diagnosis not present

## 2022-07-15 DIAGNOSIS — I10 Essential (primary) hypertension: Secondary | ICD-10-CM | POA: Insufficient documentation

## 2022-07-15 DIAGNOSIS — N3090 Cystitis, unspecified without hematuria: Secondary | ICD-10-CM | POA: Insufficient documentation

## 2022-07-15 DIAGNOSIS — Z79899 Other long term (current) drug therapy: Secondary | ICD-10-CM | POA: Diagnosis not present

## 2022-07-15 DIAGNOSIS — K5730 Diverticulosis of large intestine without perforation or abscess without bleeding: Secondary | ICD-10-CM | POA: Diagnosis not present

## 2022-07-15 DIAGNOSIS — R55 Syncope and collapse: Secondary | ICD-10-CM | POA: Diagnosis not present

## 2022-07-15 DIAGNOSIS — Z794 Long term (current) use of insulin: Secondary | ICD-10-CM | POA: Diagnosis not present

## 2022-07-15 DIAGNOSIS — E119 Type 2 diabetes mellitus without complications: Secondary | ICD-10-CM | POA: Diagnosis not present

## 2022-07-15 DIAGNOSIS — R1032 Left lower quadrant pain: Secondary | ICD-10-CM

## 2022-07-15 DIAGNOSIS — Z7984 Long term (current) use of oral hypoglycemic drugs: Secondary | ICD-10-CM | POA: Insufficient documentation

## 2022-07-15 DIAGNOSIS — B3749 Other urogenital candidiasis: Secondary | ICD-10-CM | POA: Diagnosis not present

## 2022-07-15 DIAGNOSIS — N200 Calculus of kidney: Secondary | ICD-10-CM | POA: Diagnosis not present

## 2022-07-15 LAB — COMPREHENSIVE METABOLIC PANEL
ALT: 15 U/L (ref 0–44)
AST: 20 U/L (ref 15–41)
Albumin: 3.9 g/dL (ref 3.5–5.0)
Alkaline Phosphatase: 74 U/L (ref 38–126)
Anion gap: 10 (ref 5–15)
BUN: 19 mg/dL (ref 8–23)
CO2: 20 mmol/L — ABNORMAL LOW (ref 22–32)
Calcium: 10.4 mg/dL — ABNORMAL HIGH (ref 8.9–10.3)
Chloride: 110 mmol/L (ref 98–111)
Creatinine, Ser: 0.97 mg/dL (ref 0.44–1.00)
GFR, Estimated: >60 mL/min (ref >60)
Glucose, Bld: 109 mg/dL — ABNORMAL HIGH (ref 70–99)
Potassium: 4.7 mmol/L (ref 3.5–5.1)
Sodium: 140 mmol/L (ref 135–145)
Total Bilirubin: 0.4 mg/dL (ref 0.3–1.2)
Total Protein: 7.1 g/dL (ref 6.5–8.1)

## 2022-07-15 LAB — CBC
HCT: 42.9 % (ref 36.0–46.0)
Hemoglobin: 13.5 g/dL (ref 12.0–15.0)
MCH: 28.9 pg (ref 26.0–34.0)
MCHC: 31.5 g/dL (ref 30.0–36.0)
MCV: 91.9 fL (ref 80.0–100.0)
Platelets: 215 10*3/uL (ref 150–400)
RBC: 4.67 MIL/uL (ref 3.87–5.11)
RDW: 14.1 % (ref 11.5–15.5)
WBC: 10.7 10*3/uL — ABNORMAL HIGH (ref 4.0–10.5)
nRBC: 0 % (ref 0.0–0.2)

## 2022-07-15 LAB — URINALYSIS, ROUTINE W REFLEX MICROSCOPIC
Bacteria, UA: NONE SEEN
Bilirubin Urine: NEGATIVE
Glucose, UA: NEGATIVE mg/dL
Hgb urine dipstick: NEGATIVE
Ketones, ur: NEGATIVE mg/dL
Nitrite: NEGATIVE
Protein, ur: NEGATIVE mg/dL
Specific Gravity, Urine: 1.018 (ref 1.005–1.030)
pH: 6 (ref 5.0–8.0)

## 2022-07-15 LAB — URINALYSIS, W/ REFLEX TO CULTURE (INFECTION SUSPECTED)
Bacteria, UA: NONE SEEN
Bilirubin Urine: NEGATIVE
Glucose, UA: NEGATIVE mg/dL
Hgb urine dipstick: NEGATIVE
Ketones, ur: NEGATIVE mg/dL
Nitrite: NEGATIVE
Protein, ur: NEGATIVE mg/dL
Specific Gravity, Urine: 1.018 (ref 1.005–1.030)
pH: 6 (ref 5.0–8.0)

## 2022-07-15 LAB — ETHANOL: Alcohol, Ethyl (B): <10 mg/dL (ref <10)

## 2022-07-15 LAB — LIPASE, BLOOD: Lipase: 32 U/L (ref 11–51)

## 2022-07-15 MED ORDER — IOHEXOL 300 MG/ML  SOLN
100.0000 mL | Freq: Once | INTRAMUSCULAR | Status: AC | PRN
  Administered 2022-07-15: 85 mL via INTRAVENOUS

## 2022-07-15 MED ORDER — SODIUM CHLORIDE 0.9 % IV SOLN
1.0000 g | Freq: Once | INTRAVENOUS | Status: AC
  Administered 2022-07-15: 1 g via INTRAVENOUS
  Filled 2022-07-15: qty 10

## 2022-07-15 MED ORDER — CEPHALEXIN 500 MG PO CAPS: 500.0000 mg | ORAL_CAPSULE | Freq: Three times a day (TID) | ORAL | 0 refills | Status: AC
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
## 2022-07-15 NOTE — ED Notes (Signed)
Reviewed AVS/discharge instruction with patient. Time allotted for and all questions answered. Patient is agreeable for d/c and escorted to ed exit by staff.  

## 2022-07-15 NOTE — ED Triage Notes (Signed)
Pt arrives to ED with c/o lower abdominal pain x1 week associated with constipation.

## 2022-07-16 DIAGNOSIS — L738 Other specified follicular disorders: Secondary | ICD-10-CM | POA: Diagnosis not present

## 2022-07-16 DIAGNOSIS — D492 Neoplasm of unspecified behavior of bone, soft tissue, and skin: Secondary | ICD-10-CM | POA: Diagnosis not present

## 2022-07-16 LAB — URINE CULTURE: Culture: NO GROWTH

## 2022-07-20 ENCOUNTER — Ambulatory Visit (INDEPENDENT_AMBULATORY_CARE_PROVIDER_SITE_OTHER): Payer: Medicare Other | Admitting: Psychiatry

## 2022-07-20 ENCOUNTER — Encounter: Payer: Self-pay | Admitting: Psychiatry

## 2022-07-20 DIAGNOSIS — F319 Bipolar disorder, unspecified: Secondary | ICD-10-CM

## 2022-07-20 DIAGNOSIS — G8929 Other chronic pain: Secondary | ICD-10-CM | POA: Diagnosis not present

## 2022-07-20 DIAGNOSIS — M533 Sacrococcygeal disorders, not elsewhere classified: Secondary | ICD-10-CM | POA: Diagnosis not present

## 2022-07-20 DIAGNOSIS — R4182 Altered mental status, unspecified: Secondary | ICD-10-CM

## 2022-07-20 DIAGNOSIS — Z79899 Other long term (current) drug therapy: Secondary | ICD-10-CM

## 2022-07-20 MED ORDER — LITHIUM CARBONATE ER 450 MG PO TBCR
EXTENDED_RELEASE_TABLET | ORAL | 0 refills | Status: DC
Start: 2022-07-20 — End: 2022-07-23

## 2022-07-20 NOTE — Progress Notes (Unsigned)
[name] [name] [id number] 13-Nov-1945 77 y.o.  Subjective:   Patient ID:  [name] [name] is a 77 y.o. (DOB March 22, 1945) female.  Chief Complaint: No chief complaint on file.   HPI [name] [name] presents to the office today for follow-up of *** She reports, "Something serious is wrong."  She reports that she has been having hypersomnolence and was falling asleep while talking with daughters and while eating. She reports that she has been occasionally falling asleep/ "pass out" while driving. She reports that this has been occurring for "a long time" and is now happening more often and now during the daytime. She reports feeling "sleepy... like drugged." She reports that her husband commented that she seemed "out of it this morning." She notices occasional slurred speech. She reports feeling physically slowed. She reports difficulty with balance. She has an apt to see cardiology regarding possible syncopal episodes. She reports sometimes she awakens with tremors. She reports, "I'm really confused." She reports that she occasionally does not recall conversations she has just had with her husband.   Recent UTI.   She reports hypersomnolence is "not as bad as it was" compared to the past.   She has not been driving.   She reports that her anxiety has been "bad." She reports that she had a panic attack while starting to doze off while driving.   "My mood is terrible. I feel like I don't have any quality of life." She reports, "I think I am depressed." Denies any recent manic symptoms. Denies impulsivity- "I can't really do anything." She reports irritable mood. She reports poor sleep at night "and then I doze off and on." She reports very low energy. Motivation has been low. Appetite is "not as good as it normally is." She reports poor concentration. Anhedonia- "I don't want to do anything. I don't want to go anywhere." Denies SI.   She reports that she has been taking Xanax prn 3-4  times a day.   Denies any recent gambling or ETOH use.   Continues to see Bradley Ferris, LCAS.   Past Medication Trials: Tegretol-adverse reaction Lamictal-itching, swollen lymph nodes Trileptal-ineffective Depakote- has taken long-term with some benefit Gabapentin-prescribed for RLS, pain, and anxiety.  Unable to tolerate doses above 400 mg 3 times daily Topamax Gabitril Lyrica Keppra Zonegran Sonata-intermittently effective Xanax-effective Ativan Valium Klonopin-patient reports feeling "peculiar" Lithium-has taken long-term with some improvement.  Unable to tolerate doses greater than 450 mg Seroquel- helpful for racing thoughts and mood signs and symptoms.  Daytime somnolence with higher doses.  Has caused RLS without Requip. Geodon-tolerability issues Rexulti-EPS Latuda-EPS Vraylar-EPS Saphris-excessive sedation and severe RLS Abilify-may have exacerbated gambling Risperdal Olanzapine- RLS, increased appetite, wt gain Perphenazine Loxapine- severe adverse effects at low dose. Had weakness, twitches BuSpar-ineffective Prozac-effective and then stopped working Zoloft-could not tolerate Lexapro-has used short-term to alleviate depressive signs and symptoms Remeron Wellbutrin Vistaril- ineffective Requip- takes due to restless legs secondary to Seroquel.  Has taken long-term and reports being on higher doses in the past.  Denies correlation between Requip and gambling. Pramipexole NAC Deplin Buprenorphine Namenda Verapamil Pindolol Isradapine Clonidine Lunesta-ineffective Belsomra- ineffective Dayvigo- Legs "buckled up." Ambien Ambien CR-in effective Trazodone-nightmares Doxepin- ineffective Remeron  AIMS    Flowsheet Row Office Visit from 10/24/2020 in [facility] Office Visit from 06/17/2020 in [facility] Office Visit from 04/23/2020 in [facility] Office Visit from  10/09/2019 in [facility]  AIMS  Total Score 0 0 0 0      GAD-7    Flowsheet Row Office Visit from 04/23/2020 in [facility] ED from 03/28/2020 in [facility] Emergency Department at [facility]  Total GAD-7 Score 11 20      Mini-Mental    Flowsheet Row Office Visit from 08/07/2021 in [facility] Office Visit from 01/27/2021 in [facility]  Total Score (max 30 points ) 28 28      PHQ2-9    Flowsheet Row Telephone from 11/21/2021 in [facility] from 08/16/2020 in [facility] at [facility] ED from 03/28/2020 in [facility] Emergency Department at [facility] Nutrition from 04/30/2016 in [facility] at [facility] Total Score 0 0 6 6  PHQ-9 Total Score -- -- 26 22      Flowsheet Row ED from 07/15/2022 in [facility] Emergency Department at [facility] ED from 07/02/2022 in [facility] Emergency Department at [facility] ED to Hosp-Admission (Discharged) from 06/03/2022 in [place] LONG 4TH FLOOR PROGRESSIVE CARE AND UROLOGY  C-SSRS RISK CATEGORY No Risk No Risk No Risk        Review of Systems:  Review of Systems  Gastrointestinal:  Negative for diarrhea, nausea and vomiting.  Musculoskeletal:  Positive for back pain and gait problem.  Skin:        Recent biopsy  Neurological:  Positive for tremors.  Psychiatric/Behavioral:         Please refer to HPI    Medications: {medication reviewed/display:3041432}  Current Outpatient Medications  Medication Sig Dispense Refill   Acetaminophen 500 MG capsule Take 1,000 mg by mouth every 6 (six) hours as needed for fever or pain.     albuterol (VENTOLIN HFA) 108 (90 Base) MCG/ACT inhaler Inhale 1-2 puffs into the lungs every 6 (six) hours as needed for wheezing or  shortness of breath.     [START ON 07/22/2022] ALPRAZolam (XANAX) 0.5 MG tablet Take 1 tablet (0.5 mg total) by mouth 4 (four) times daily as needed for anxiety. 120 tablet 2   atorvastatin (LIPITOR) 40 MG tablet Take 1 tablet (40 mg total) by mouth See admin instructions. Take 40 mg by mouth at 5 PM  0   b complex vitamins capsule Take 1 capsule by mouth daily.     bismuth subsalicylate (PEPTO BISMOL) 262 MG/15ML suspension Take 30 mLs by mouth every 6 (six) hours as needed.     calcium carbonate (TUMS EX) 750 MG chewable tablet Chew 1 tablet by mouth daily.     cephALEXin (KEFLEX) 500 MG capsule Take 1 capsule (500 mg total) by mouth 3 (three) times daily for 7 days. 21 capsule 0   cholecalciferol (VITAMIN D3) 25 MCG (1000 UNIT) tablet Take 1,000-2,000 Units by mouth See admin instructions. Take 1,000 units by mouth in the morning and 2,000 units at 5 PM     CONTOUR NEXT TEST test strip USE TO TEST BLOOD SUGAR TWICE DAILY 100 strip 3   cyclobenzaprine (FLEXERIL) 5 MG tablet Take 5 mg by mouth 3 (three) times daily as needed for muscle spasms.     diclofenac (VOLTAREN) 75 MG EC tablet Take 75 mg by mouth 2 (two) times daily.     diclofenac Sodium (VOLTAREN) 1 % GEL Apply 4 g topically 4 (four) times daily. (Patient taking differently: Apply 4 g topically 4 (  four) times daily as needed (for pain- affected areas).) 100 g 0   dicyclomine (BENTYL) 20 MG tablet Take 1 tablet (20 mg total) by mouth 3 (three) times daily as needed for spasms. 20 tablet 0   divalproex (DEPAKOTE) 250 MG DR tablet Take 2 tablets (500 mg total) by mouth at bedtime. 180 tablet 0   ezetimibe (ZETIA) 10 MG tablet Take 1 tablet (10 mg total) by mouth every evening.     fluticasone (FLONASE) 50 MCG/ACT nasal spray Place 1 spray into both nostrils 2 (two) times daily as needed for allergies or rhinitis.     gabapentin (NEURONTIN) 600 MG tablet Take 600 mg by mouth 2 (two) times daily. Take 600 mg by mouth in the morning, at 5 PM, and  at bedtime     insulin glargine (LANTUS SOLOSTAR) 100 UNIT/ML Solostar Pen ADMINISTER 68 UNITS UNDER THE SKIN AT BEDTIME (Patient taking differently: ADMINISTER 70 UNITS UNDER THE SKIN AT BEDTIME) 15 mL 2   insulin lispro (HUMALOG KWIKPEN) 100 UNIT/ML KwikPen Inject 14-18 Units into the skin 2 (two) times daily before a meal. 15 mL 1   ipratropium (ATROVENT) 0.06 % nasal spray Place 2 sprays into both nostrils 4 (four) times daily as needed for rhinitis.     Ipratropium-Albuterol (COMBIVENT RESPIMAT) 20-100 MCG/ACT AERS respimat Inhale 1 puff into the lungs in the morning, at noon, and at bedtime for 7 days, THEN 1 puff every 6 (six) hours as needed for wheezing. 1 each 0   levothyroxine (SYNTHROID) 75 MCG tablet Take 1 tablet (75 mcg total) by mouth daily before breakfast. 90 tablet 3   lidocaine (XYLOCAINE) 2 % solution Use as directed 15 mLs in the mouth or throat as directed.     liothyronine (CYTOMEL) 5 MCG tablet TAKE 1 TABLET BY MOUTH DAILY WITH LEVOTHYROXINE  TAKE BEFORE BREAKFAST (Patient taking differently: Take 5 mcg by mouth daily before breakfast.) 90 tablet 3   lithium carbonate (ESKALITH) 450 MG ER tablet Take 1 tablet (450 mg total) by mouth at bedtime. 90 tablet 0   loperamide (IMODIUM) 2 MG capsule Take 2 capsules (4 mg total) by mouth 3 (three) times daily as needed for diarrhea or loose stools. 30 capsule 0   loratadine (CLARITIN) 10 MG tablet Take 10 mg by mouth daily as needed for allergies.     losartan (COZAAR) 25 MG tablet Take 25 mg by mouth See admin instructions. Take 25 mg by mouth at 5 PM     metFORMIN (GLUCOPHAGE-XR) 500 MG 24 hr tablet Take 1 tablet (500 mg total) by mouth daily with supper. 30 tablet 0   Microlet Lancets MISC TEST DAILY AS DIRECTED 100 each 3   nitroGLYCERIN (NITROSTAT) 0.4 MG SL tablet Place 1 tablet (0.4 mg total) under the tongue every 5 (five) minutes as needed for chest pain. 25 tablet 2   ondansetron (ZOFRAN) 4 MG tablet Take 1 tablet (4 mg  total) by mouth every 6 (six) hours. (Patient taking differently: Take 4 mg by mouth every 6 (six) hours as needed for nausea or vomiting.) 12 tablet 0   oxyCODONE-acetaminophen (PERCOCET/ROXICET) 5-325 MG tablet Take 1-2 tablets by mouth every 6 (six) hours as needed for severe pain. 15 tablet 0   pantoprazole (PROTONIX) 40 MG tablet Take 1 tablet (40 mg total) by mouth daily at 6 (six) AM. 30 tablet 0   QUEtiapine (SEROQUEL) 200 MG tablet Take 1 tablet (200 mg total) by mouth See admin instructions. Take 200  mg by mouth at 5 PM and at bedtime 180 tablet 0   rOPINIRole (REQUIP) 0.5 MG tablet Takes 4 tabs at 5 pm and 3 tabs at bedtime and 1-2 tabs prn (Patient taking differently: Take 0.5-2 mg by mouth See admin instructions. Take 2 mg by mouth at 5 PM and 1.5 mg at bedtime- may take an additional 0.5 mg one to two times a day as needed/as directed) 720 tablet 0   Wheat Dextrin (BENEFIBER PO) Take 4-5 g by mouth daily as needed (for constipation- mix as directed).     No current facility-administered medications for this visit.    Medication Side Effects: {Medication Side Effects (Optional):21014029}  Allergies:  Allergies  Allergen Reactions   Codeine Anxiety and Other (See Comments)    Hallucinations, tolerates oxycodone    Procaine Hcl Palpitations   Epinephrine Palpitations and Other (See Comments)    Makes heart race at the dentist's    Metformin And Related Nausea Only   Ozempic (0.25 Or 0.5 Mg-Dose) [Semaglutide(0.25 Or 0.5mg -Dos)] Nausea Only   Aspirin Nausea And Vomiting and Other (See Comments)    Burns the stomach   Augmentin [Amoxicillin-Pot Clavulanate] Diarrhea   Benadryl [Diphenhydramine] Other (See Comments)    Per MD "inhibits potency of gabapentin, lithium etc"   Diflucan [Fluconazole] Other (See Comments)    Unknown reaction    Dilaudid [Hydromorphone Hcl] Other (See Comments)    Migraines and nightmares    Hydromorphone Other (See Comments)    Reaction  unconfirmed   Morphine Other (See Comments)    headache   Other Nausea And Vomiting and Other (See Comments)    Pt states that all -mycins cause N/V. (MACROLIDES)   Exposure to steroids MUST BE LIMITED (or, mental status change/manic behavior happens)   Prednisone     Long term steroid problems with lithium and blood sugar.    Tramadol Hcl Other (See Comments)    Made the patient "feel weird"   Macrobid [Nitrofurantoin] Rash and Other (See Comments)    Rash around ankles   Sulfa Antibiotics Other (See Comments)    Unknown reaction    Past Medical History:  Diagnosis Date   Alcohol abuse    Anxiety    takes Valium daily as needed and Ativan daily   Arthritis    Bilateral hearing loss    Bipolar 1 disorder ([name])    takes Lithium nightly and Synthroid daily   CFS (chronic fatigue syndrome)    Chewing difficulty    Chronic back pain    DDD; "all over" (09/14/2017)   Colitis, ischemic ([name]) 2012   Confusion    r/t meds   Constipation    Depression    takes Prozac daily and Bupspirone    Diabetes ([name])    Diverticulosis    Dyslipidemia    takes Crestor daily   Edema of both lower extremities    Fibromyalgia    Gastroparesis    Headache    "weekly" (09/14/2017)   Hepatic steatosis 06/18/2012   severe   Hyperlipidemia    Hypertension    Hypothyroidism    IBS (irritable bowel syndrome)    Ischemic colitis ([name])    Joint pain    Joint swelling    Lupus erythematosus tumidus    tumid-skin   Migraine    "1-2/yr; maybe" (09/14/2017)   Numbness    in right foot   Osteoarthritis    "all over" (09/14/2017)   Osteoarthritis cervical spine  Osteoarthritis of hand    bilateral   Pneumonia    "walking pneumonia several times; long time since the last time" (09/14/2017)   Restless leg syndrome    takes Requip nightly   Sciatica    Swallowing difficulty    Type II diabetes mellitus ([name])    Urinary frequency    Urinary leakage    Urinary urgency    Urinary, incontinence,  stress female    Walking pneumonia    last time more than 60yrs ago    Past Medical History, Surgical history, Social history, and Family history were reviewed and updated as appropriate.   Please see review of systems for further details on the patient's review from today.   Objective:   Physical Exam:  There were no vitals taken for this visit.  Physical Exam  Lab Review:     Component Value Date/Time   NA 140 07/15/2022 1600   NA 140 01/27/2021 1526   K 4.7 07/15/2022 1600   CL 110 07/15/2022 1600   CO2 20 (L) 07/15/2022 1600   GLUCOSE 109 (H) 07/15/2022 1600   BUN 19 07/15/2022 1600   BUN 8 01/27/2021 1526   CREATININE 0.97 07/15/2022 1600   CREATININE 0.85 10/09/2019 1052   CALCIUM 10.4 (H) 07/15/2022 1600   PROT 7.1 07/15/2022 1600   PROT 6.4 01/27/2021 1526   ALBUMIN 3.9 07/15/2022 1600   ALBUMIN 4.4 01/27/2021 1526   AST 20 07/15/2022 1600   ALT 15 07/15/2022 1600   ALKPHOS 74 07/15/2022 1600   BILITOT 0.4 07/15/2022 1600   BILITOT 0.4 01/27/2021 1526   GFRNONAA >60 07/15/2022 1600   GFRNONAA 57 (L) 11/17/2017 0000   GFRAA >60 12/10/2019 0657   GFRAA 67 11/17/2017 0000       Component Value Date/Time   WBC 10.7 (H) 07/15/2022 1600   RBC 4.67 07/15/2022 1600   HGB 13.5 07/15/2022 1600   HGB 14.0 01/27/2021 1526   HCT 42.9 07/15/2022 1600   HCT 41.5 01/27/2021 1526   PLT 215 07/15/2022 1600   PLT 255 01/27/2021 1526   MCV 91.9 07/15/2022 1600   MCV 87 01/27/2021 1526   MCH 28.9 07/15/2022 1600   MCHC 31.5 07/15/2022 1600   RDW 14.1 07/15/2022 1600   RDW 13.6 01/27/2021 1526   LYMPHSABS 0.7 06/05/2022 0501   LYMPHSABS 1.9 01/27/2021 1526   MONOABS 0.2 06/05/2022 0501   EOSABS 0.0 06/05/2022 0501   EOSABS 0.1 01/27/2021 1526   BASOSABS 0.0 06/05/2022 0501   BASOSABS 0.0 01/27/2021 1526    Lithium Lvl  Date Value Ref Range Status  05/29/2022 0.48 (L) 0.60 - 1.20 mmol/L Final    Comment:    Performed at [facility], [address], [place], [place] [address]     Lab Results  Component Value Date   VALPROATE 18 (L) 05/29/2022     .res Assessment: Plan:    There are no diagnoses linked to this encounter.   Please see After Visit Summary for patient specific instructions.  Future Appointments  Date Time Provider Department Center  07/20/2022  1:30 PM Corie Chiquito, PMHNP CP[facility] None  07/30/2022  2:40 PM Worthy Rancher, MD [facility] None  08/12/2022 11:20 AM Maisie Fus, MD [facility] None  08/26/2022 10:00 AM [facility] END[facility]/NEURO LAB [facility] None  08/28/2022 11:15 AM Reather Littler, MD [facility] None    No orders of the defined types were placed in this encounter.   -------------------------------

## 2022-07-20 NOTE — Patient Instructions (Addendum)
-   Please do not take Lithium tonight since anti-inflammatory medication can interfere with clearance of Lithium and raise Lithium levels. Higher lithium level may be contributing to your sleepiness. Also, would like to rule out Lithium toxicity as cause of some of your recent symptoms.   - Please have labs drawn to determine lithium level, valproic acid level, and ammonia level. I will be in touch once results are available.

## 2022-07-21 ENCOUNTER — Telehealth: Payer: Self-pay | Admitting: Psychiatry

## 2022-07-21 ENCOUNTER — Encounter: Payer: Self-pay | Admitting: Psychiatry

## 2022-07-21 LAB — CREATININE, SERUM: Creat: 0.94 mg/dL (ref 0.60–1.00)

## 2022-07-21 LAB — AMMONIA: Ammonia: 40 umol/L (ref <=72)

## 2022-07-21 LAB — LITHIUM LEVEL: Lithium Lvl: 0.7 mmol/L (ref 0.6–1.2)

## 2022-07-21 LAB — VALPROIC ACID LEVEL: Valproic Acid Lvl: 50.5 mg/L (ref 50.0–100.0)

## 2022-07-21 NOTE — Telephone Encounter (Signed)
Called patient to discuss lab results. She reports that she feels "pretty much the same" since not taking Lithium last night. She reports that she has been falling asleep easily. Husband does not recall her having slurred speech today. She reports that she continues to feel unsteady. Denies tremor today. She reports that she has had some confusion today and has mixed up appointments.   Decrease Seroquel to 1/2 of a 100 mg tablet at 5:30 pm and take 1 tablet at bedtime.   Hold Lithium again tonight.   Sees PCP tomorrow.   Will follow-up with patient tomorrow.

## 2022-07-22 DIAGNOSIS — F319 Bipolar disorder, unspecified: Secondary | ICD-10-CM | POA: Diagnosis not present

## 2022-07-22 DIAGNOSIS — G479 Sleep disorder, unspecified: Secondary | ICD-10-CM | POA: Diagnosis not present

## 2022-07-22 DIAGNOSIS — R5383 Other fatigue: Secondary | ICD-10-CM | POA: Diagnosis not present

## 2022-07-22 DIAGNOSIS — G4700 Insomnia, unspecified: Secondary | ICD-10-CM | POA: Diagnosis not present

## 2022-07-23 ENCOUNTER — Telehealth: Payer: Medicare Other | Admitting: Psychiatry

## 2022-07-23 ENCOUNTER — Other Ambulatory Visit: Payer: Self-pay

## 2022-07-23 ENCOUNTER — Telehealth: Payer: Self-pay | Admitting: Psychiatry

## 2022-07-23 DIAGNOSIS — F5101 Primary insomnia: Secondary | ICD-10-CM

## 2022-07-23 DIAGNOSIS — F319 Bipolar disorder, unspecified: Secondary | ICD-10-CM

## 2022-07-23 DIAGNOSIS — G2581 Restless legs syndrome: Secondary | ICD-10-CM

## 2022-07-23 MED ORDER — ROPINIROLE HCL 0.5 MG PO TABS
ORAL_TABLET | ORAL | 0 refills | Status: DC
Start: 2022-07-23 — End: 2022-10-07

## 2022-07-23 MED ORDER — QUETIAPINE FUMARATE 200 MG PO TABS: 200.0000 mg | ORAL_TABLET | Freq: Every day | ORAL | 0 refills | Status: DC

## 2022-07-23 NOTE — Telephone Encounter (Signed)
[name] [name] called at 1:35 because she thought you were going to call her yesterday.  I told her she was waiting until she had seen her PCP.  She is awaiting your call back about the medication changes.  Please call.

## 2022-07-23 NOTE — Telephone Encounter (Signed)
Called pt to follow-up. She saw PCP and PCP referred her to neurology for sleep study and to rule out narcolepsy. She is waiting to see cardiology. She reports that UTI s/s have improved.   She reports, "I'm just tired and can't get any energy." She reports that she continues to feel sleepy. She reports that she has had some slurred speech at times. She reports that her sleep is "still not good' at night. She reports that she has been "really irritable and anxious" and is not sure if this is related to not being able to drive and do things- "It's just hard knowing I can't go get in the car." She reports feeling anxious and worried about health.   She reports that her husband, "doesn't think I'm quite as bad as it was... he thinks I'm still having them" (episodes of falling asleep suddenly). She reports that she continues to feel confused and having difficulty remembering things. She reports, "I'm still unsteady" and describes difficulty with balance.   Has not been taking Lithium. She is taking 1/2 tablet of Seroquel at 5:30 pm and 1 tablet at bedtime.   PLAN: Stop early evening dose of Seroquel. Continue Seroquel 200 mg at bedtime. Will not re-start Lithium at this time.

## 2022-07-23 NOTE — Telephone Encounter (Signed)
I printed the med list from her visit with [facility] yesterday and put in your box outside your door.

## 2022-07-24 ENCOUNTER — Telehealth: Payer: Self-pay | Admitting: Psychiatry

## 2022-07-24 NOTE — Telephone Encounter (Signed)
She reports, "I'm hanging in there." She reports that she was not able to sleep last night. She reports that she has not slept any today. She reports, "I haven't been really that sleepy today." Denies manic symptoms. She reports that her mood remains somewhat irritable and anxious. She reports that slurred speech has improved. She reports that she continues to feel unsteady. She reports that her memory and concentration are "about the same." She reports that she has not been as hungry. Her husband said that it took her about half the day yesterday to fully wake up and stop slurring her words. Husband told her she seemed not to have these symptoms this morning. Noticed pt seemed short of breath on telephone. She reports that she has been more short of breath.  PLAN: Will continue current plan of care over the weekend. Recommended checking blood oxygenation level since she reports that she has a pulse ox at home. Advised pt to follow-up with medical providers if oxygenation is lower than usual and/or she starts to have signs of a respiratory infection or worsening shortness of breath.

## 2022-07-25 ENCOUNTER — Telehealth: Payer: Self-pay | Admitting: Psychiatry

## 2022-07-25 NOTE — Telephone Encounter (Signed)
Pt reports that she is "confused about how much Seroquel I am supposed to be taking." Reviewed med changes and current medications and pt reports, "then we're doing it right."  She reports that she slept some last night, about 4.5 hours. She reports that she is feeling less sleepy. She reports that she and her husband are "out running errands" and it is the first time she has left home in [place]. She reports, "I still feel a little strange" and reports that she may be continuing to adjust to medication changes.   Pt did not sound short of breath today on the phone.   Will continue current plan.

## 2022-07-25 NOTE — Telephone Encounter (Signed)
Received after hours call from patient that she may have taken her medications incorrectly and is requesting a call. Attempted to call patient. No answer. Unable to leave a message since VM is not set up.

## 2022-07-27 ENCOUNTER — Telehealth: Payer: Self-pay | Admitting: Psychiatry

## 2022-07-27 NOTE — Telephone Encounter (Signed)
Called pt to follow-up on how she has been doing in response to recent medication reductions. No answer. L/M requesting that she call office back and request to speak with the nurse to provide an update on how she is doing.

## 2022-07-27 NOTE — Telephone Encounter (Addendum)
Patient still reporting having difficulty waking up in the morning and slurring her words until about lunchtime. No appetite. Difficulty getting to sleep. See neuro and cardiology on 6/5. Has appt with PCP 5/23. Off lithium.   Patient sounded SOB. She took O2 sat and it was 94%. Your note said she sounded SOB and she had a phone call with PCP nurse and she noted the same. She said she was SOB.   She said her husband made a video of her in the morning.

## 2022-07-28 NOTE — Telephone Encounter (Signed)
Recommend continuing current medications for now. Please ask her to call with updates after appointments with medical providers or if there is a significant change in her symptoms.

## 2022-07-28 NOTE — Telephone Encounter (Addendum)
Patient also questioning her diagnosis of bipolar 1. Others have mentioned this diagnosis, but she feels like she is BP-2.   Recommendations given to the patient.

## 2022-07-29 DIAGNOSIS — B3731 Acute candidiasis of vulva and vagina: Secondary | ICD-10-CM | POA: Diagnosis not present

## 2022-07-29 DIAGNOSIS — R300 Dysuria: Secondary | ICD-10-CM | POA: Diagnosis not present

## 2022-07-29 DIAGNOSIS — N3000 Acute cystitis without hematuria: Secondary | ICD-10-CM | POA: Diagnosis not present

## 2022-07-30 ENCOUNTER — Encounter (INDEPENDENT_AMBULATORY_CARE_PROVIDER_SITE_OTHER): Payer: Self-pay | Admitting: Internal Medicine

## 2022-07-30 ENCOUNTER — Ambulatory Visit (INDEPENDENT_AMBULATORY_CARE_PROVIDER_SITE_OTHER): Payer: Medicare Other | Admitting: Internal Medicine

## 2022-07-30 ENCOUNTER — Other Ambulatory Visit (INDEPENDENT_AMBULATORY_CARE_PROVIDER_SITE_OTHER): Payer: Self-pay | Admitting: Internal Medicine

## 2022-07-30 VITALS — BP 131/79 | HR 85 | Temp 97.8°F | Ht 60.0 in | Wt 181.0 lb

## 2022-07-30 DIAGNOSIS — I10 Essential (primary) hypertension: Secondary | ICD-10-CM

## 2022-07-30 DIAGNOSIS — Z794 Long term (current) use of insulin: Secondary | ICD-10-CM

## 2022-07-30 DIAGNOSIS — F319 Bipolar disorder, unspecified: Secondary | ICD-10-CM

## 2022-07-30 DIAGNOSIS — Z6835 Body mass index (BMI) 35.0-35.9, adult: Secondary | ICD-10-CM

## 2022-07-30 DIAGNOSIS — Z7984 Long term (current) use of oral hypoglycemic drugs: Secondary | ICD-10-CM

## 2022-07-30 DIAGNOSIS — E1165 Type 2 diabetes mellitus with hyperglycemia: Secondary | ICD-10-CM

## 2022-07-30 DIAGNOSIS — R638 Other symptoms and signs concerning food and fluid intake: Secondary | ICD-10-CM | POA: Diagnosis not present

## 2022-07-30 DIAGNOSIS — E669 Obesity, unspecified: Secondary | ICD-10-CM | POA: Diagnosis not present

## 2022-07-30 MED ORDER — METFORMIN HCL ER 500 MG PO TB24: 500.0000 mg | ORAL_TABLET | Freq: Two times a day (BID) | ORAL | 0 refills | Status: DC

## 2022-07-30 NOTE — Progress Notes (Signed)
Office: [phone]  /  Fax: [phone]  WEIGHT SUMMARY AND BIOMETRICS  Vitals Temp: 97.8 F (36.6 C) BP: 131/79 Pulse Rate: 85 SpO2: 93 %   Anthropometric Measurements Height: 5' (1.524 m) Weight: 181 lb (82.1 kg) BMI (Calculated): 35.35 Weight at Last Visit: 181 lb Weight Lost Since Last Visit: 0 Weight Gained Since Last Visit: 0 Starting Weight: 191 lb Total Weight Loss (lbs): 10 lb (4.536 kg) Peak Weight: 194 lb   Body Composition  Body Fat %: 45.2 % Fat Mass (lbs): 82 lbs Muscle Mass (lbs): 94.4 lbs Total Body Water (lbs): 67.4 lbs Visceral Fat Rating : 15    No data recorded Today's Visit #: 6  Starting Date: 05/06/22   HPI  Chief Complaint: OBESITY  [name] [name] is here to discuss her progress with her obesity treatment plan. She is on the the Category 2 Plan and states she is following her eating plan approximately 50-60 % of the time. She states she is not exercising.  Interval History:  Since last office visit she has []  lost [x]  maintained [] gained weight.  She was recently in the hospital because of syncopal or near syncopal event while driving.  She is no longer driving and will be undergoing further evaluation on an outpatient basis.  She notes that her daughters had commented how she dozes off during the day.  She is on several psychotropic medication which are being reduced by psychiatry.  She tells me that her Seroquel has been decreased and that she is no longer on lithium.  She is also have not been using short acting insulin she is on a basal bolus regimen.  Last office visit we had introduced metformin which she seems to be tolerating well without any adverse effects.    Adherence to nutrition plan :  []  Excellent []  Good [x]  Fair []  Suboptimal []  Variable []  Gradual implementation [] Has not started implementation  Nutritional: She has been working on not skipping meals, increasing protein intake at every meal, eating more fruits, eating  more vegetables, and drinking more water  Orexigenic Control: [x] Denies [] Reports problems with appetite and hunger signals.  [x] Denies [] Reports problems with satiety and satiation.  [x] Denies [] Reports problems with eating patterns and portion control.  [x] Denies [] Reports strong cravings for highly palatable foods [x] Denies [] Reports problems with feeling restricted or deprived  Stress levels: []  Low [x] Medium [] High   Barriers identified: medical comorbidities and polypharmacy with weight promoting drugs. .   Pharmacotherapy for weight loss: She is currently taking no anti-obesity medication.    ASSESSMENT AND PLAN  TREATMENT PLAN FOR OBESITY:  Recommended Dietary Goals  [name] [name] is currently in the action stage of change. As such, her goal is to continue weight management plan. She has agreed to: continue current plan  Behavioral Intervention  We discussed the following Behavioral Modification Strategies today: increasing lean protein intake, decreasing simple carbohydrates , increasing vegetables, increasing lower glycemic fruits, increasing fiber rich foods, avoiding skipping meals, increasing water intake, continue to practice mindfulness when eating, and planning for success.  Additional resources provided today: None  Recommended Physical Activity Goals  [name] [name] has been advised to work up to 150 minutes of moderate intensity aerobic activity a week and strengthening exercises 2-3 times per week for cardiovascular health, weight loss maintenance and preservation of muscle mass.   She has agreed to :  Think about ways to increase daily physical activity and overcoming barriers to exercise  Pharmacotherapy We have discussed various medication options to help  [name] [name] with her weight loss efforts and we both agreed to : continue with nutritional and behavioral strategies  ASSOCIATED CONDITIONS ADDRESSED TODAY  Type 2 diabetes mellitus with hyperglycemia, with  long-term current use of insulin ([name]) Assessment & Plan: She has very limited treatment options.  She is now tolerating metformin without any adverse effects we will increase medication to twice a day.  She self discontinued fast acting insulin with breakfast and lunch and has been only doing Premeal insulin with dinner.  I reviewed her CGM it does show she does have excursions in the evening so she does benefit from a Premeal bolus at that time.  She also is on basal insulin.  I am hoping the metformin will help her and this would allow her to reduce amount of insulin which may assist with weight loss.  Her psychiatric medications are also being decreased which would also help with insulin resistance and glycemic control as well as weight loss.  Orders: -     metFORMIN HCl ER; Take 1 tablet (500 mg total) by mouth 2 (two) times daily with a meal.  Dispense: 60 tablet; Refill: 0  Obesity with current BMI of 34.9  Essential hypertension Assessment & Plan: Blood pressure improved,  on losartan 25 mg a day without adverse effects.  She will continue on current regimen.  She will monitor for symptoms of orthostasis.  She was also advised to maintain adequate hydration and to increase mobility.   Inadequate fluid intake Assessment & Plan: Patient has inadequate fluid intake.  This increases her risk of orthostasis and falls also other health problems.  We discussed the importance of maintaining adequate hydration as well as goals of care.   Bipolar 1 disorder ([name]) Assessment & Plan: Patient continues to work with mental health and her medications are being decreased.  It seems that she is doing well and has not experienced a decline in her mental health.  I think this will be of great benefit to patient particularly with reports of daytime somnolence and near syncopal episodes.  This will also improve glycemic control and help with weight weight loss     PHYSICAL EXAM:  Blood pressure  131/79, pulse 85, temperature 97.8 F (36.6 C), height 5' (1.524 m), weight 181 lb (82.1 kg), SpO2 93 %. Body mass index is 35.35 kg/m.  General: She is overweight, cooperative, alert, well developed, and in no acute distress. PSYCH: Has normal mood, affect and thought process.   HEENT: EOMI, sclerae are anicteric. Lungs: Normal breathing effort, no conversational dyspnea. Extremities: No edema.  Neurologic: No gross sensory or motor deficits. No tremors or fasciculations noted.    DIAGNOSTIC DATA REVIEWED:  BMET    Component Value Date/Time   NA 140 07/15/2022 1600   NA 140 01/27/2021 1526   K 4.7 07/15/2022 1600   CL 110 07/15/2022 1600   CO2 20 (L) 07/15/2022 1600   GLUCOSE 109 (H) 07/15/2022 1600   BUN 19 07/15/2022 1600   BUN 8 01/27/2021 1526   CREATININE 0.94 07/20/2022 1640   CALCIUM 10.4 (H) 07/15/2022 1600   GFRNONAA >60 07/15/2022 1600   GFRNONAA 57 (L) 11/17/2017 0000   GFRAA >60 12/10/2019 0657   GFRAA 67 11/17/2017 0000   EGFR 69 01/27/2021 1526   Lab Results  Component Value Date   HGBA1C 7.2 (H) 05/06/2022   HGBA1C 5.5 12/25/2010   No results found for: "INSULIN" Lab Results  Component Value Date   TSH 0.790 05/06/2022  CBC    Component Value Date/Time   WBC 10.7 (H) 07/15/2022 1600   RBC 4.67 07/15/2022 1600   HGB 13.5 07/15/2022 1600   HGB 14.0 01/27/2021 1526   HCT 42.9 07/15/2022 1600   HCT 41.5 01/27/2021 1526   PLT 215 07/15/2022 1600   PLT 255 01/27/2021 1526   MCV 91.9 07/15/2022 1600   MCV 87 01/27/2021 1526   MCH 28.9 07/15/2022 1600   MCHC 31.5 07/15/2022 1600   RDW 14.1 07/15/2022 1600   RDW 13.6 01/27/2021 1526   Iron Studies    Component Value Date/Time   FERRITIN 43 07/06/2015 0459   Lipid Panel     Component Value Date/Time   CHOL 159 05/06/2022 0948   TRIG 175 (H) 05/06/2022 0948   HDL 58 05/06/2022 0948   CHOLHDL 3 12/26/2021 1004   VLDL 46.8 (H) 12/26/2021 1004   LDLCALC 72 05/06/2022 0948   LDLDIRECT 84.0  12/26/2021 1004   Hepatic Function Panel     Component Value Date/Time   PROT 7.1 07/15/2022 1600   PROT 6.4 01/27/2021 1526   ALBUMIN 3.9 07/15/2022 1600   ALBUMIN 4.4 01/27/2021 1526   AST 20 07/15/2022 1600   ALT 15 07/15/2022 1600   ALKPHOS 74 07/15/2022 1600   BILITOT 0.4 07/15/2022 1600   BILITOT 0.4 01/27/2021 1526   BILIDIR 0.2 06/14/2021 0406   IBILI 0.5 06/14/2021 0406      Component Value Date/Time   TSH 0.790 05/06/2022 0948   Nutritional Lab Results  Component Value Date   VD25OH 37.1 05/06/2022   VD25OH 21 (L) 07/04/2012     Return in about 2 weeks (around 08/13/2022) for For Weight Mangement with Dr. Rikki Spearing.Marland Kitchen She was informed of the importance of frequent follow up visits to maximize her success with intensive lifestyle modifications for her multiple health conditions.   ATTESTASTION STATEMENTS:  Reviewed by clinician on day of visit: allergies, medications, problem list, medical history, surgical history, family history, social history, and previous encounter notes.     Worthy Rancher, MD

## 2022-07-30 NOTE — Assessment & Plan Note (Signed)
Patient continues to work with mental health and her medications are being decreased.  It seems that she is doing well and has not experienced a decline in her mental health.  I think this will be of great benefit to patient particularly with reports of daytime somnolence and near syncopal episodes.  This will also improve glycemic control and help with weight weight loss

## 2022-07-30 NOTE — Assessment & Plan Note (Addendum)
Blood pressure improved,  on losartan 25 mg a day without adverse effects.  She will continue on current regimen.  She will monitor for symptoms of orthostasis.  She was also advised to maintain adequate hydration and to increase mobility.

## 2022-07-30 NOTE — Assessment & Plan Note (Signed)
Patient has inadequate fluid intake.  This increases her risk of orthostasis and falls also other health problems.  We discussed the importance of maintaining adequate hydration as well as goals of care.

## 2022-07-30 NOTE — Assessment & Plan Note (Addendum)
She has very limited treatment options.  She is now tolerating metformin without any adverse effects we will increase medication to twice a day.  She self discontinued fast acting insulin with breakfast and lunch and has been only doing Premeal insulin with dinner.  I reviewed her CGM it does show she does have excursions in the evening so she does benefit from a Premeal bolus at that time.  She also is on basal insulin.  I am hoping the metformin will help her and this would allow her to reduce amount of insulin which may assist with weight loss.  Her psychiatric medications are also being decreased which would also help with insulin resistance and glycemic control as well as weight loss.

## 2022-07-31 ENCOUNTER — Other Ambulatory Visit: Payer: Self-pay | Admitting: Endocrinology

## 2022-07-31 DIAGNOSIS — E119 Type 2 diabetes mellitus without complications: Secondary | ICD-10-CM

## 2022-08-04 ENCOUNTER — Ambulatory Visit: Payer: Medicare Other | Admitting: Nutrition

## 2022-08-05 ENCOUNTER — Other Ambulatory Visit (INDEPENDENT_AMBULATORY_CARE_PROVIDER_SITE_OTHER): Payer: Self-pay | Admitting: Internal Medicine

## 2022-08-05 DIAGNOSIS — E1165 Type 2 diabetes mellitus with hyperglycemia: Secondary | ICD-10-CM

## 2022-08-06 DIAGNOSIS — M533 Sacrococcygeal disorders, not elsewhere classified: Secondary | ICD-10-CM | POA: Diagnosis not present

## 2022-08-07 ENCOUNTER — Ambulatory Visit (INDEPENDENT_AMBULATORY_CARE_PROVIDER_SITE_OTHER): Payer: Medicare Other | Admitting: Podiatry

## 2022-08-07 DIAGNOSIS — S90112A Contusion of left great toe without damage to nail, initial encounter: Secondary | ICD-10-CM | POA: Diagnosis not present

## 2022-08-07 NOTE — Progress Notes (Signed)
Subjective:  Patient ID: [name] [name], female    DOB: 07-01-1945,  MRN: [id number]  Chief Complaint  Patient presents with   Nail Problem    77 y.o. female presents with the above complaint.  Patient presents with complaint left fifth digit mild contusion to the toenail.  She wanted get eval make sure there is noninfected.  She states that it was hurting a little bit.  She denies seeing anyone as prior to seeing me pain scale is 2 out of 10 dull achy in nature..   Review of Systems: Negative except as noted in the HPI. Denies N/V/F/Ch.  Past Medical History:  Diagnosis Date   Alcohol abuse    Anxiety    takes Valium daily as needed and Ativan daily   Arthritis    Bilateral hearing loss    Bipolar 1 disorder ([name])    takes Lithium nightly and Synthroid daily   CFS (chronic fatigue syndrome)    Chewing difficulty    Chronic back pain    DDD; "all over" (09/14/2017)   Colitis, ischemic ([name]) 2012   Confusion    r/t meds   Constipation    Depression    takes Prozac daily and Bupspirone    Diabetes ([name])    Diverticulosis    Dyslipidemia    takes Crestor daily   Edema of both lower extremities    Fibromyalgia    Gastroparesis    Headache    "weekly" (09/14/2017)   Hepatic steatosis 06/18/2012   severe   Hyperlipidemia    Hypertension    Hypothyroidism    IBS (irritable bowel syndrome)    Ischemic colitis ([name])    Joint pain    Joint swelling    Lupus erythematosus tumidus    tumid-skin   Migraine    "1-2/yr; maybe" (09/14/2017)   Numbness    in right foot   Osteoarthritis    "all over" (09/14/2017)   Osteoarthritis cervical spine    Osteoarthritis of hand    bilateral   Pneumonia    "walking pneumonia several times; long time since the last time" (09/14/2017)   Restless leg syndrome    takes Requip nightly   Sciatica    Swallowing difficulty    Type II diabetes mellitus ([name])    Urinary frequency    Urinary leakage    Urinary urgency    Urinary,  incontinence, stress female    Walking pneumonia    last time more than 12yrs ago    Current Outpatient Medications:    Acetaminophen 500 MG capsule, Take 1,000 mg by mouth every 6 (six) hours as needed for fever or pain., Disp: , Rfl:    albuterol (VENTOLIN HFA) 108 (90 Base) MCG/ACT inhaler, Inhale 1-2 puffs into the lungs every 6 (six) hours as needed for wheezing or shortness of breath. (Patient not taking: Reported on 08/12/2022), Disp: , Rfl:    ALPRAZolam (XANAX) 0.5 MG tablet, Take 1 tablet (0.5 mg total) by mouth 4 (four) times daily as needed for anxiety., Disp: 120 tablet, Rfl: 2   atorvastatin (LIPITOR) 40 MG tablet, Take 1 tablet (40 mg total) by mouth See admin instructions. Take 40 mg by mouth at 5 PM, Disp: , Rfl: 0   b complex vitamins capsule, Take 1 capsule by mouth daily., Disp: , Rfl:    bismuth subsalicylate (PEPTO BISMOL) 262 MG/15ML suspension, Take 30 mLs by mouth every 6 (six) hours as needed., Disp: , Rfl:    calcium carbonate (  TUMS EX) 750 MG chewable tablet, Chew 1 tablet by mouth as needed., Disp: , Rfl:    cholecalciferol (VITAMIN D3) 25 MCG (1000 UNIT) tablet, Take 1,000-2,000 Units by mouth See admin instructions. Take 1,000 units by mouth in the morning and 2,000 units at 5 PM, Disp: , Rfl:    CONTOUR NEXT TEST test strip, USE TO TEST BLOOD SUGAR TWICE DAILY, Disp: 100 strip, Rfl: 3   cyclobenzaprine (FLEXERIL) 5 MG tablet, Take 5 mg by mouth 3 (three) times daily as needed for muscle spasms., Disp: , Rfl:    diclofenac (VOLTAREN) 75 MG EC tablet, Take 75 mg by mouth 2 (two) times daily., Disp: , Rfl:    diclofenac Sodium (VOLTAREN) 1 % GEL, Apply 4 g topically 4 (four) times daily. (Patient taking differently: Apply 4 g topically 4 (four) times daily as needed (for pain- affected areas).), Disp: 100 g, Rfl: 0   dicyclomine (BENTYL) 20 MG tablet, Take 1 tablet (20 mg total) by mouth 3 (three) times daily as needed for spasms., Disp: 20 tablet, Rfl: 0   divalproex  (DEPAKOTE) 250 MG DR tablet, Take 2 tablets (500 mg total) by mouth at bedtime., Disp: 180 tablet, Rfl: 0   ezetimibe (ZETIA) 10 MG tablet, Take 1 tablet (10 mg total) by mouth every evening., Disp: , Rfl:    fluticasone (FLONASE) 50 MCG/ACT nasal spray, Place 1 spray into both nostrils 2 (two) times daily as needed for allergies or rhinitis., Disp: , Rfl:    FUROSEMIDE PO, Take by mouth as needed., Disp: , Rfl:    gabapentin (NEURONTIN) 600 MG tablet, Take 600 mg by mouth 2 (two) times daily. Take 600 mg by mouth in the morning, at 5 PM, and at bedtime, Disp: , Rfl:    insulin glargine (LANTUS SOLOSTAR) 100 UNIT/ML Solostar Pen, ADMINISTER 76 UNITS UNDER THE SKIN AT BEDTIME, Disp: 15 mL, Rfl: 2   insulin lispro (HUMALOG KWIKPEN) 100 UNIT/ML KwikPen, Inject 14-18 Units into the skin 2 (two) times daily before a meal., Disp: 15 mL, Rfl: 1   ipratropium (ATROVENT) 0.06 % nasal spray, Place 2 sprays into both nostrils 4 (four) times daily as needed for rhinitis. (Patient not taking: Reported on 08/12/2022), Disp: , Rfl:    Ipratropium-Albuterol (COMBIVENT RESPIMAT) 20-100 MCG/ACT AERS respimat, Inhale 1 puff into the lungs in the morning, at noon, and at bedtime for 7 days, THEN 1 puff every 6 (six) hours as needed for wheezing., Disp: 1 each, Rfl: 0   levothyroxine (SYNTHROID) 75 MCG tablet, Take 1 tablet (75 mcg total) by mouth daily before breakfast., Disp: 90 tablet, Rfl: 3   lidocaine (XYLOCAINE) 2 % solution, Use as directed 15 mLs in the mouth or throat as directed. (Patient not taking: Reported on 08/12/2022), Disp: , Rfl:    liothyronine (CYTOMEL) 5 MCG tablet, TAKE 1 TABLET BY MOUTH DAILY WITH LEVOTHYROXINE  TAKE BEFORE BREAKFAST (Patient taking differently: Take 5 mcg by mouth daily before breakfast.), Disp: 90 tablet, Rfl: 3   loperamide (IMODIUM) 2 MG capsule, Take 2 capsules (4 mg total) by mouth 3 (three) times daily as needed for diarrhea or loose stools., Disp: 30 capsule, Rfl: 0   loratadine  (CLARITIN) 10 MG tablet, Take 10 mg by mouth daily as needed for allergies., Disp: , Rfl:    losartan (COZAAR) 25 MG tablet, Take 25 mg by mouth See admin instructions. Take 25 mg by mouth at 5 PM, Disp: , Rfl:    metFORMIN (GLUCOPHAGE-XR) 500  MG 24 hr tablet, Take 1 tablet (500 mg total) by mouth 2 (two) times daily with a meal., Disp: 60 tablet, Rfl: 0   Microlet Lancets MISC, TEST DAILY AS DIRECTED, Disp: 100 each, Rfl: 3   nitroGLYCERIN (NITROSTAT) 0.4 MG SL tablet, Place 1 tablet (0.4 mg total) under the tongue every 5 (five) minutes as needed for chest pain., Disp: 25 tablet, Rfl: 2   ondansetron (ZOFRAN) 4 MG tablet, Take 1 tablet (4 mg total) by mouth every 6 (six) hours. (Patient taking differently: Take 4 mg by mouth every 6 (six) hours as needed for nausea or vomiting.), Disp: 12 tablet, Rfl: 0   pantoprazole (PROTONIX) 40 MG tablet, Take 1 tablet (40 mg total) by mouth daily at 6 (six) AM. (Patient not taking: Reported on 08/12/2022), Disp: 30 tablet, Rfl: 0   POTASSIUM PO, Take by mouth as needed., Disp: , Rfl:    QUEtiapine (SEROQUEL) 100 MG tablet, Take 1 tablet (100 mg total) by mouth at bedtime., Disp: 30 tablet, Rfl: 0   rOPINIRole (REQUIP) 0.5 MG tablet, Takes 4 tabs at 5 pm and 3 tabs at bedtime and 1-2 tabs prn, Disp: 720 tablet, Rfl: 0   Wheat Dextrin (BENEFIBER PO), Take 4-5 g by mouth daily as needed (for constipation- mix as directed)., Disp: , Rfl:   Social History   Tobacco Use  Smoking Status Never  Smokeless Tobacco Never    Allergies  Allergen Reactions   Codeine Anxiety and Other (See Comments)    Hallucinations, tolerates oxycodone    Procaine Hcl Palpitations   Epinephrine Palpitations and Other (See Comments)    Makes heart race at the dentist's    Metformin And Related Nausea Only   Ozempic (0.25 Or 0.5 Mg-Dose) [Semaglutide(0.25 Or 0.5mg -Dos)] Nausea Only   Aspirin Nausea And Vomiting and Other (See Comments)    Burns the stomach   Augmentin  [Amoxicillin-Pot Clavulanate] Diarrhea   Benadryl [Diphenhydramine] Other (See Comments)    Per MD "inhibits potency of gabapentin, lithium etc"   Diflucan [Fluconazole] Other (See Comments)    Unknown reaction    Dilaudid [Hydromorphone Hcl] Other (See Comments)    Migraines and nightmares    Hydromorphone Other (See Comments)    Reaction unconfirmed   Morphine Other (See Comments)    headache   Other Nausea And Vomiting and Other (See Comments)    Pt states that all -mycins cause N/V. (MACROLIDES)   Exposure to steroids MUST BE LIMITED (or, mental status change/manic behavior happens)   Prednisone     Long term steroid problems with lithium and blood sugar.    Tramadol Hcl Other (See Comments)    Made the patient "feel weird"   Macrobid [Nitrofurantoin] Rash and Other (See Comments)    Rash around ankles   Sulfa Antibiotics Other (See Comments)    Unknown reaction   Objective:  There were no vitals filed for this visit. There is no height or weight on file to calculate BMI. Constitutional Well developed. Well nourished.  Vascular Dorsalis pedis pulses palpable bilaterally. Posterior tibial pulses palpable bilaterally. Capillary refill normal to all digits.  No cyanosis or clubbing noted. Pedal hair growth normal.  Neurologic Normal speech. Oriented to person, place, and time. Epicritic sensation to light touch grossly present bilaterally.  Dermatologic Left fifth digit nail contusion mild damage no damage to the nail.  Orthopedic: Normal joint ROM without pain or crepitus bilaterally. No visible deformities. No bony tenderness.   Radiographs: None Assessment:  1. Contusion of left great toe without damage to nail, initial encounter    Plan:  Patient was evaluated and treated and all questions answered.  Left fifth digit nail contusion mild -All questions and concerns were discussed with the patient in extensive detail -Given that there is no damage to it I  discussed with the patient to continue clinical monitoring if it gets worse she can come back and see me.  Discussed shoe gear modification.  No follow-ups on file.Left fifth digit nail contusion mild damage to the nail nothing to do

## 2022-08-11 ENCOUNTER — Ambulatory Visit (INDEPENDENT_AMBULATORY_CARE_PROVIDER_SITE_OTHER): Payer: Medicare Other | Admitting: Psychiatry

## 2022-08-11 ENCOUNTER — Encounter: Payer: Self-pay | Admitting: Psychiatry

## 2022-08-11 DIAGNOSIS — F319 Bipolar disorder, unspecified: Secondary | ICD-10-CM

## 2022-08-11 DIAGNOSIS — F5101 Primary insomnia: Secondary | ICD-10-CM | POA: Diagnosis not present

## 2022-08-11 MED ORDER — QUETIAPINE FUMARATE 100 MG PO TABS
100.0000 mg | ORAL_TABLET | Freq: Every day | ORAL | 0 refills | Status: DC
Start: 2022-08-11 — End: 2022-09-08
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
## 2022-08-12 ENCOUNTER — Ambulatory Visit: Payer: Medicare Other | Attending: Internal Medicine | Admitting: Internal Medicine

## 2022-08-12 ENCOUNTER — Encounter: Payer: Self-pay | Admitting: Internal Medicine

## 2022-08-12 ENCOUNTER — Ambulatory Visit (INDEPENDENT_AMBULATORY_CARE_PROVIDER_SITE_OTHER): Payer: Medicare Other

## 2022-08-12 VITALS — BP 142/80 | HR 88 | Ht 60.0 in | Wt 182.6 lb

## 2022-08-12 DIAGNOSIS — G4719 Other hypersomnia: Secondary | ICD-10-CM | POA: Diagnosis not present

## 2022-08-12 DIAGNOSIS — I10 Essential (primary) hypertension: Secondary | ICD-10-CM | POA: Diagnosis not present

## 2022-08-12 DIAGNOSIS — R55 Syncope and collapse: Secondary | ICD-10-CM

## 2022-08-12 DIAGNOSIS — G4700 Insomnia, unspecified: Secondary | ICD-10-CM | POA: Diagnosis not present

## 2022-08-12 DIAGNOSIS — G2581 Restless legs syndrome: Secondary | ICD-10-CM | POA: Diagnosis not present

## 2022-08-12 DIAGNOSIS — F319 Bipolar disorder, unspecified: Secondary | ICD-10-CM | POA: Diagnosis not present
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
## 2022-08-12 NOTE — Patient Instructions (Signed)
Medication Instructions:  The current medical regimen is effective;  continue present plan and medications.  *If you need a refill on your cardiac medications before your next appointment, please call your pharmacy*   Testing/Procedures: ZIO XT- Long Term Monitor Instructions  Your physician has requested you wear a ZIO patch monitor for 7 days.  This is a single patch monitor. [employer] supplies one patch monitor per enrollment. Additional stickers are not available. Please do not apply patch if you will be having a Nuclear Stress Test,  Echocardiogram, Cardiac CT, MRI, or Chest Xray during the period you would be wearing the  monitor. The patch cannot be worn during these tests. You cannot remove and re-apply the  ZIO XT patch monitor.  Your ZIO patch monitor will be mailed 3 day USPS to your address on file. It may take 3-5 days  to receive your monitor after you have been enrolled.  Once you have received your monitor, please review the enclosed instructions. Your monitor  has already been registered assigning a specific monitor serial # to you.  Billing and Patient Assistance Program Information  We have supplied [employer] with any of your insurance information on file for billing purposes. [employer] offers a sliding scale Patient Assistance Program for patients that do not have  insurance, or whose insurance does not completely cover the cost of the ZIO monitor.  You must apply for the Patient Assistance Program to qualify for this discounted rate.  To apply, please call [employer] at [phone], select option 4, select option 2, ask to apply for  Patient Assistance Program. Meredeth Ide will ask your household income, and how many people  are in your household. They will quote your out-of-pocket cost based on that information.  [employer] will also be able to set up a 89-month, interest-free payment plan if needed.  Applying the monitor   Shave hair from upper left chest.  Hold abrader disc  by orange tab. Rub abrader in 40 strokes over the upper left chest as  indicated in your monitor instructions.  Clean area with 4 enclosed alcohol pads. Let dry.  Apply patch as indicated in monitor instructions. Patch will be placed under collarbone on left  side of chest with arrow pointing upward.  Rub patch adhesive wings for 2 minutes. Remove white label marked "1". Remove the white  label marked "2". Rub patch adhesive wings for 2 additional minutes.  While looking in a mirror, press and release button in center of patch. A small green light will  flash 3-4 times. This will be your only indicator that the monitor has been turned on.  Do not shower for the first 24 hours. You may shower after the first 24 hours.  Press the button if you feel a symptom. You will hear a small click. Record Date, Time and  Symptom in the Patient Logbook.  When you are ready to remove the patch, follow instructions on the last 2 pages of Patient  Logbook. Stick patch monitor onto the last page of Patient Logbook.  Place Patient Logbook in the blue and white box. Use locking tab on box and tape box closed  securely. The blue and white box has prepaid postage on it. Please place it in the mailbox as  soon as possible. Your physician should have your test results approximately 7 days after the  monitor has been mailed back to [facility].  Call [facility] Customer Care at [phone] if you have questions regarding  your ZIO XT  patch monitor. Call them immediately if you see an orange light blinking on your  monitor.  If your monitor falls off in less than 4 days, contact our Monitor department at [phone].  If your monitor becomes loose or falls off after 4 days call [employer] at [phone] for  suggestions on securing your monitor    Follow-Up: At [facility], you and your health needs are our priority.  As part of our continuing mission to provide you with exceptional heart care,  we have created designated Provider Care Teams.  These Care Teams include your primary Cardiologist (physician) and Advanced Practice Providers (APPs -  Physician Assistants and Nurse Practitioners) who all work together to provide you with the care you need, when you need it.  We recommend signing up for the patient portal called "MyChart".  Sign up information is provided on this After Visit Summary.  MyChart is used to connect with patients for Virtual Visits (Telemedicine).  Patients are able to view lab/test results, encounter notes, upcoming appointments, etc.  Non-urgent messages can be sent to your provider as well.   To learn more about what you can do with MyChart, go to [email or web address].    Your next appointment:   As needed  Provider:   Carolan Clines, MD

## 2022-08-12 NOTE — Progress Notes (Unsigned)
Enrolled patient for a 7 day Zio XT monitor to be mailed to patients home.  

## 2022-08-15 DIAGNOSIS — R55 Syncope and collapse: Secondary | ICD-10-CM

## 2022-08-18 ENCOUNTER — Ambulatory Visit (INDEPENDENT_AMBULATORY_CARE_PROVIDER_SITE_OTHER): Payer: Medicare Other | Admitting: Internal Medicine

## 2022-08-18 ENCOUNTER — Encounter (INDEPENDENT_AMBULATORY_CARE_PROVIDER_SITE_OTHER): Payer: Self-pay | Admitting: Internal Medicine

## 2022-08-18 VITALS — BP 138/82 | HR 90 | Temp 97.7°F | Ht 60.0 in | Wt 177.0 lb

## 2022-08-18 DIAGNOSIS — Z794 Long term (current) use of insulin: Secondary | ICD-10-CM | POA: Diagnosis not present

## 2022-08-18 DIAGNOSIS — E669 Obesity, unspecified: Secondary | ICD-10-CM | POA: Diagnosis not present

## 2022-08-18 DIAGNOSIS — I10 Essential (primary) hypertension: Secondary | ICD-10-CM | POA: Diagnosis not present

## 2022-08-18 DIAGNOSIS — Z6834 Body mass index (BMI) 34.0-34.9, adult: Secondary | ICD-10-CM

## 2022-08-18 DIAGNOSIS — Z7984 Long term (current) use of oral hypoglycemic drugs: Secondary | ICD-10-CM | POA: Diagnosis not present

## 2022-08-18 DIAGNOSIS — E1165 Type 2 diabetes mellitus with hyperglycemia: Secondary | ICD-10-CM | POA: Diagnosis not present

## 2022-08-18 DIAGNOSIS — F319 Bipolar disorder, unspecified: Secondary | ICD-10-CM

## 2022-08-18 DIAGNOSIS — R638 Other symptoms and signs concerning food and fluid intake: Secondary | ICD-10-CM | POA: Diagnosis not present

## 2022-08-18 MED ORDER — METFORMIN HCL ER 500 MG PO TB24: 500.0000 mg | ORAL_TABLET | Freq: Every day | ORAL | 0 refills | Status: DC

## 2022-08-18 NOTE — Progress Notes (Signed)
Office: [phone]  /  Fax: [phone]  WEIGHT SUMMARY AND BIOMETRICS  Vitals Temp: 97.7 F (36.5 C) BP: 138/82 Pulse Rate: 90 SpO2: 94 %   Anthropometric Measurements Height: 5' (1.524 m) Weight: 177 lb (80.3 kg) BMI (Calculated): 34.57 Weight at Last Visit: 181 lb Weight Lost Since Last Visit: 4 lb Starting Weight: 191 lb Total Weight Loss (lbs): 14 lb (6.35 kg)   Body Composition  Body Fat %: 44.2 % Fat Mass (lbs): 78.6 lbs Muscle Mass (lbs): 94.2 lbs Total Body Water (lbs): 65 lbs Visceral Fat Rating : 14    No data recorded Today's Visit #: 7  Starting Date: 05/06/22   HPI  Chief Complaint: OBESITY  [name] [name] is here to discuss her progress with her obesity treatment plan. She is on the the Category 2 Plan and states she is following her eating plan approximately 50 % of the time. She states she is not exercising.  Interval History:  Since last office visit she has lost 4 pounds. She reports fair adherence to reduced calorie nutritional plan. She has been working on reading food labels, not skipping meals, increasing protein intake at every meal, eating more fruits, eating more vegetables, drinking more water, making healthier choices, begun to exercise, and controlling orexigenic cues and stimuli Denies problems with appetite and hunger signals.  Denies problems with satiety and satiation.  Denies problems with eating patterns and portion control.  Denies abnormal cravings. Denies feeling deprived or restricted.  She reports noticing an improvement in appetite since starting metformin.  She is only been taking medication once a day instead of twice a day and denies any side effects.   Barriers identified: orthopedic problems, medical conditions or chronic pain affecting mobility and medical comorbidities.   Pharmacotherapy for weight loss: She is currently taking Metformin (off label use for incretin effect and / or insulin resistance and / or  diabetes prevention) .    ASSESSMENT AND PLAN  TREATMENT PLAN FOR OBESITY:  Recommended Dietary Goals  Eleisha is currently in the action stage of change. As such, her goal is to continue weight management plan. She has agreed to: continue current plan  Behavioral Intervention  We discussed the following Behavioral Modification Strategies today: increasing lean protein intake, decreasing simple carbohydrates , increasing vegetables, increasing lower glycemic fruits, increasing fiber rich foods, increasing water intake, and planning for success.  Additional resources provided today: None  Recommended Physical Activity Goals  Ahnyla has been advised to work up to 150 minutes of moderate intensity aerobic activity a week and strengthening exercises 2-3 times per week for cardiovascular health, weight loss maintenance and preservation of muscle mass.   She has agreed to :  Increase the intensity, frequency or duration of aerobic exercises    Pharmacotherapy We discussed various medication options to help Claris Che with her weight loss efforts and we both agreed to :  Continue metformin with primary indication of diabetes  ASSOCIATED CONDITIONS ADDRESSED TODAY  Obesity with current BMI of 34.9 Assessment & Plan: She has lost 14 pounds with the preservation of muscle mass.  She will continue with reduced calorie nutrition plan and metformin for incretin effect.  Her psychotropic medications are being reduced by mental health which is also of benefit.   Type 2 diabetes mellitus with hyperglycemia, with long-term current use of insulin ([name]) Assessment & Plan: Improved.  We reviewed CGM which showed time in target of 94% without excursions or hypoglycemic episodes.  She is no longer requiring  fast acting insulin.  She is now on basal insulin and tolerating metformin which is only taking once a day instead of twice a day.  I would like for her to continue at that dose.  Orders: -      metFORMIN HCl ER; Take 1 tablet (500 mg total) by mouth daily with supper.  Dispense: 60 tablet; Refill: 0  Essential hypertension Assessment & Plan: Blood pressure improved,  on losartan 25 mg a day without adverse effects.  She will continue on current regimen.  She will monitor for symptoms of orthostasis.  She was also advised to maintain adequate hydration and to increase mobility.   Inadequate fluid intake Assessment & Plan: Improved.  Continue monitoring fluid intake.   Bipolar 1 disorder ([name]) Assessment & Plan: Patient continues to work with mental health and her medications are being decreased.  She is no longer on lithium as her Seroquel has been reduced to possibly just 1 tablet at night.  She is no longer having daytime somnolence and has noticed an improvement in her balance.  It seems that she is doing well but has been noticing increased irritability at times.  We have also noticed an improvement in glycemic control she is no longer requiring fast acting insulin.  She is content with progress.     PHYSICAL EXAM:  Blood pressure 138/82, pulse 90, temperature 97.7 F (36.5 C), height 5' (1.524 m), weight 177 lb (80.3 kg), SpO2 94 %. Body mass index is 34.57 kg/m.  General: She is overweight, cooperative, alert, well developed, and in no acute distress. PSYCH: Has normal mood, affect and thought process.   HEENT: EOMI, sclerae are anicteric. Lungs: Normal breathing effort, no conversational dyspnea. Extremities: No edema.  Neurologic: No gross sensory or motor deficits. No tremors or fasciculations noted.    DIAGNOSTIC DATA REVIEWED:  BMET    Component Value Date/Time   NA 140 07/15/2022 1600   NA 140 01/27/2021 1526   K 4.7 07/15/2022 1600   CL 110 07/15/2022 1600   CO2 20 (L) 07/15/2022 1600   GLUCOSE 109 (H) 07/15/2022 1600   BUN 19 07/15/2022 1600   BUN 8 01/27/2021 1526   CREATININE 0.94 07/20/2022 1640   CALCIUM 10.4 (H) 07/15/2022 1600   GFRNONAA  >60 07/15/2022 1600   GFRNONAA 57 (L) 11/17/2017 0000   GFRAA >60 12/10/2019 0657   GFRAA 67 11/17/2017 0000   Lab Results  Component Value Date   HGBA1C 7.2 (H) 05/06/2022   HGBA1C 5.5 12/25/2010   No results found for: "INSULIN" Lab Results  Component Value Date   TSH 0.790 05/06/2022   CBC    Component Value Date/Time   WBC 10.7 (H) 07/15/2022 1600   RBC 4.67 07/15/2022 1600   HGB 13.5 07/15/2022 1600   HGB 14.0 01/27/2021 1526   HCT 42.9 07/15/2022 1600   HCT 41.5 01/27/2021 1526   PLT 215 07/15/2022 1600   PLT 255 01/27/2021 1526   MCV 91.9 07/15/2022 1600   MCV 87 01/27/2021 1526   MCH 28.9 07/15/2022 1600   MCHC 31.5 07/15/2022 1600   RDW 14.1 07/15/2022 1600   RDW 13.6 01/27/2021 1526   Iron Studies    Component Value Date/Time   FERRITIN 43 07/06/2015 0459   Lipid Panel     Component Value Date/Time   CHOL 159 05/06/2022 0948   TRIG 175 (H) 05/06/2022 0948   HDL 58 05/06/2022 0948   CHOLHDL 3 12/26/2021 1004   VLDL 46.8 (  H) 12/26/2021 1004   LDLCALC 72 05/06/2022 0948   LDLDIRECT 84.0 12/26/2021 1004   Hepatic Function Panel     Component Value Date/Time   PROT 7.1 07/15/2022 1600   PROT 6.4 01/27/2021 1526   ALBUMIN 3.9 07/15/2022 1600   ALBUMIN 4.4 01/27/2021 1526   AST 20 07/15/2022 1600   ALT 15 07/15/2022 1600   ALKPHOS 74 07/15/2022 1600   BILITOT 0.4 07/15/2022 1600   BILITOT 0.4 01/27/2021 1526   BILIDIR 0.2 06/14/2021 0406   IBILI 0.5 06/14/2021 0406      Component Value Date/Time   TSH 0.790 05/06/2022 0948   Nutritional Lab Results  Component Value Date   VD25OH 37.1 05/06/2022   VD25OH 21 (L) 07/04/2012     Return in about 2 weeks (around 09/01/2022) for For Weight Mangement with Dr. Rikki Spearing.Marland Kitchen She was informed of the importance of frequent follow up visits to maximize her success with intensive lifestyle modifications for her multiple health conditions.   ATTESTASTION STATEMENTS:  Reviewed by clinician on day of  visit: allergies, medications, problem list, medical history, surgical history, family history, social history, and previous encounter notes.     Worthy Rancher, MD

## 2022-08-18 NOTE — Assessment & Plan Note (Signed)
Patient continues to work with mental health and her medications are being decreased.  She is no longer on lithium as her Seroquel has been reduced to possibly just 1 tablet at night.  She is no longer having daytime somnolence and has noticed an improvement in her balance.  It seems that she is doing well but has been noticing increased irritability at times.  We have also noticed an improvement in glycemic control she is no longer requiring fast acting insulin.  She is content with progress.

## 2022-08-18 NOTE — Assessment & Plan Note (Signed)
Blood pressure improved,  on losartan 25 mg a day without adverse effects.  She will continue on current regimen.  She will monitor for symptoms of orthostasis.  She was also advised to maintain adequate hydration and to increase mobility. 

## 2022-08-18 NOTE — Assessment & Plan Note (Signed)
Improved.  Continue monitoring fluid intake.

## 2022-08-18 NOTE — Assessment & Plan Note (Signed)
Improved.  We reviewed CGM which showed time in target of 94% without excursions or hypoglycemic episodes.  She is no longer requiring fast acting insulin.  She is now on basal insulin and tolerating metformin which is only taking once a day instead of twice a day.  I would like for her to continue at that dose.

## 2022-08-18 NOTE — Assessment & Plan Note (Signed)
She has lost 14 pounds with the preservation of muscle mass.  She will continue with reduced calorie nutrition plan and metformin for incretin effect.  Her psychotropic medications are being reduced by mental health which is also of benefit.

## 2022-08-25 ENCOUNTER — Telehealth (INDEPENDENT_AMBULATORY_CARE_PROVIDER_SITE_OTHER): Payer: Self-pay | Admitting: Internal Medicine

## 2022-08-25 NOTE — Telephone Encounter (Signed)
6/18 Patient stated she hasnt been able to havent had a bowl movement at all in the las 7 days patient. Patient stated that she has tooken benfiber, and eveything. Patient stated she is in pain. Please advise. Erie Noe

## 2022-08-26 ENCOUNTER — Other Ambulatory Visit (INDEPENDENT_AMBULATORY_CARE_PROVIDER_SITE_OTHER): Payer: Medicare Other

## 2022-08-26 DIAGNOSIS — E039 Hypothyroidism, unspecified: Secondary | ICD-10-CM

## 2022-08-26 DIAGNOSIS — E1165 Type 2 diabetes mellitus with hyperglycemia: Secondary | ICD-10-CM | POA: Diagnosis not present

## 2022-08-26 DIAGNOSIS — Z794 Long term (current) use of insulin: Secondary | ICD-10-CM

## 2022-08-26 DIAGNOSIS — E782 Mixed hyperlipidemia: Secondary | ICD-10-CM | POA: Diagnosis not present

## 2022-08-26 LAB — T4, FREE: Free T4: 0.7 ng/dL (ref 0.60–1.60)

## 2022-08-26 LAB — LIPID PANEL
Cholesterol: 175 mg/dL (ref 0–200)
HDL: 47.5 mg/dL (ref >39.00)
NonHDL: 127.56
Total CHOL/HDL Ratio: 4
Triglycerides: 289 mg/dL — ABNORMAL HIGH (ref 0.0–149.0)
VLDL: 57.8 mg/dL — ABNORMAL HIGH (ref 0.0–40.0)

## 2022-08-26 LAB — COMPREHENSIVE METABOLIC PANEL
ALT: 18 U/L (ref 0–35)
AST: 14 U/L (ref 0–37)
Albumin: 4.2 g/dL (ref 3.5–5.2)
Alkaline Phosphatase: 87 U/L (ref 39–117)
BUN: 14 mg/dL (ref 6–23)
CO2: 24 mEq/L (ref 19–32)
Calcium: 10.4 mg/dL (ref 8.4–10.5)
Chloride: 106 mEq/L (ref 96–112)
Creatinine, Ser: 0.91 mg/dL (ref 0.40–1.20)
GFR: 62.11 mL/min (ref >60.00)
Glucose, Bld: 121 mg/dL — ABNORMAL HIGH (ref 70–99)
Potassium: 4.7 mEq/L (ref 3.5–5.1)
Sodium: 140 mEq/L (ref 135–145)
Total Bilirubin: 0.5 mg/dL (ref 0.2–1.2)
Total Protein: 6.8 g/dL (ref 6.0–8.3)

## 2022-08-26 LAB — BASIC METABOLIC PANEL
BUN: 14 mg/dL (ref 6–23)
CO2: 24 mEq/L (ref 19–32)
Calcium: 10.4 mg/dL (ref 8.4–10.5)
Chloride: 106 mEq/L (ref 96–112)
Creatinine, Ser: 0.91 mg/dL (ref 0.40–1.20)
GFR: 62.11 mL/min (ref >60.00)
Glucose, Bld: 121 mg/dL — ABNORMAL HIGH (ref 70–99)
Potassium: 4.7 mEq/L (ref 3.5–5.1)
Sodium: 140 mEq/L (ref 135–145)

## 2022-08-26 LAB — T3, FREE: T3, Free: 2.7 pg/mL (ref 2.3–4.2)

## 2022-08-26 LAB — TSH: TSH: 0.89 u[IU]/mL (ref 0.35–5.50)

## 2022-08-26 LAB — LDL CHOLESTEROL, DIRECT: Direct LDL: 95 mg/dL

## 2022-08-26 LAB — HEMOGLOBIN A1C: Hgb A1c MFr Bld: 6.7 % — ABNORMAL HIGH (ref 4.6–6.5)

## 2022-08-26 NOTE — Telephone Encounter (Signed)
Patient sent a my chart message.  Also, spoke with patient and explained the instructions given from Dr Rikki Spearing.  Patient verbalized understanding and the call ended.

## 2022-08-27 ENCOUNTER — Emergency Department (HOSPITAL_BASED_OUTPATIENT_CLINIC_OR_DEPARTMENT_OTHER)
Admission: EM | Admit: 2022-08-27 | Discharge: 2022-08-27 | Disposition: A | Discharge status: Home / Self Care | Payer: Medicare Other | Attending: Emergency Medicine | Admitting: Emergency Medicine

## 2022-08-27 ENCOUNTER — Encounter (HOSPITAL_BASED_OUTPATIENT_CLINIC_OR_DEPARTMENT_OTHER): Payer: Self-pay | Admitting: Emergency Medicine

## 2022-08-27 ENCOUNTER — Other Ambulatory Visit: Payer: Self-pay

## 2022-08-27 ENCOUNTER — Emergency Department (HOSPITAL_BASED_OUTPATIENT_CLINIC_OR_DEPARTMENT_OTHER): Payer: Medicare Other

## 2022-08-27 DIAGNOSIS — R109 Unspecified abdominal pain: Secondary | ICD-10-CM | POA: Diagnosis not present

## 2022-08-27 DIAGNOSIS — Z7984 Long term (current) use of oral hypoglycemic drugs: Secondary | ICD-10-CM | POA: Insufficient documentation

## 2022-08-27 DIAGNOSIS — R1084 Generalized abdominal pain: Secondary | ICD-10-CM | POA: Diagnosis not present

## 2022-08-27 DIAGNOSIS — R1030 Lower abdominal pain, unspecified: Secondary | ICD-10-CM | POA: Diagnosis not present

## 2022-08-27 DIAGNOSIS — Z794 Long term (current) use of insulin: Secondary | ICD-10-CM | POA: Insufficient documentation

## 2022-08-27 DIAGNOSIS — N1330 Unspecified hydronephrosis: Secondary | ICD-10-CM | POA: Diagnosis not present

## 2022-08-27 LAB — COMPREHENSIVE METABOLIC PANEL
ALT: 21 U/L (ref 0–44)
AST: 17 U/L (ref 15–41)
Albumin: 4.1 g/dL (ref 3.5–5.0)
Alkaline Phosphatase: 76 U/L (ref 38–126)
Anion gap: 7 (ref 5–15)
BUN: 15 mg/dL (ref 8–23)
CO2: 26 mmol/L (ref 22–32)
Calcium: 10.4 mg/dL — ABNORMAL HIGH (ref 8.9–10.3)
Chloride: 106 mmol/L (ref 98–111)
Creatinine, Ser: 0.87 mg/dL (ref 0.44–1.00)
GFR, Estimated: >60 mL/min (ref >60)
Glucose, Bld: 116 mg/dL — ABNORMAL HIGH (ref 70–99)
Potassium: 4.4 mmol/L (ref 3.5–5.1)
Sodium: 139 mmol/L (ref 135–145)
Total Bilirubin: 0.3 mg/dL (ref 0.3–1.2)
Total Protein: 6.7 g/dL (ref 6.5–8.1)

## 2022-08-27 LAB — URINALYSIS, ROUTINE W REFLEX MICROSCOPIC
Bacteria, UA: NONE SEEN
Bilirubin Urine: NEGATIVE
Glucose, UA: NEGATIVE mg/dL
Hgb urine dipstick: NEGATIVE
Ketones, ur: NEGATIVE mg/dL
Nitrite: NEGATIVE
Protein, ur: NEGATIVE mg/dL
Specific Gravity, Urine: 1.031 — ABNORMAL HIGH (ref 1.005–1.030)
pH: 6 (ref 5.0–8.0)

## 2022-08-27 LAB — CBC WITH DIFFERENTIAL/PLATELET
Abs Immature Granulocytes: 0.05 10*3/uL (ref 0.00–0.07)
Basophils Absolute: 0 10*3/uL (ref 0.0–0.1)
Basophils Relative: 0 %
Eosinophils Absolute: 0.3 10*3/uL (ref 0.0–0.5)
Eosinophils Relative: 3 %
HCT: 44.5 % (ref 36.0–46.0)
Hemoglobin: 14.2 g/dL (ref 12.0–15.0)
Immature Granulocytes: 1 %
Lymphocytes Relative: 20 %
Lymphs Abs: 2 10*3/uL (ref 0.7–4.0)
MCH: 28.6 pg (ref 26.0–34.0)
MCHC: 31.9 g/dL (ref 30.0–36.0)
MCV: 89.7 fL (ref 80.0–100.0)
Monocytes Absolute: 0.6 10*3/uL (ref 0.1–1.0)
Monocytes Relative: 6 %
Neutro Abs: 7 10*3/uL (ref 1.7–7.7)
Neutrophils Relative %: 70 %
Platelets: 223 10*3/uL (ref 150–400)
RBC: 4.96 MIL/uL (ref 3.87–5.11)
RDW: 14.1 % (ref 11.5–15.5)
WBC: 10 10*3/uL (ref 4.0–10.5)
nRBC: 0 % (ref 0.0–0.2)

## 2022-08-27 LAB — LIPASE, BLOOD: Lipase: 31 U/L (ref 11–51)

## 2022-08-27 MED ORDER — IOHEXOL 300 MG/ML  SOLN
100.0000 mL | Freq: Once | INTRAMUSCULAR | Status: AC | PRN
  Administered 2022-08-27: 85 mL via INTRAVENOUS

## 2022-08-27 NOTE — Discharge Instructions (Addendum)
Recommend using 3-4 capfuls of MiraLAX in 30 ounces of water and drink over several hours to help with constipation.  Titrate this to effect.  You can add a scoop or take away scoop depending on how you respond.  Follow-up with a gastroenterologist.

## 2022-08-27 NOTE — ED Notes (Signed)
ED Provider at bedside. 

## 2022-08-27 NOTE — ED Notes (Signed)
Pt verbalized understanding of d/c instructions, meds, and followup care. Denies questions. VSS, no distress noted. Steady gait to exit with all belongings.  ?

## 2022-08-27 NOTE — ED Notes (Signed)
88% while resting on RA. Placed on 2LPM via [place]--> >95%. Pt reports she is scheduled for sleep study this weeks for OSA.

## 2022-08-27 NOTE — ED Triage Notes (Signed)
Pt arrives pov, with c/o generalized ABD pain with constipation x 10 days, reports passing small amts. of hard stool. Enema at home with no relief

## 2022-08-27 NOTE — ED Provider Notes (Signed)
[facility] AT [facility] Provider Note   CSN: [id number] Arrival date & time: 08/27/22  1447     History  Chief Complaint  Patient presents with   Abdominal Pain    [name] [name] is a 77 y.o. female.  The history is provided by the patient.  Abdominal Pain Pain location:  Generalized Pain quality: bloating   Pain radiates to:  Does not radiate Pain severity:  No pain Onset quality:  Gradual Duration:  2 weeks Timing:  Constant Progression:  Worsening Chronicity:  New Context comment:  Constipated, not passing much gas, using OTC stool softeners. no n/v Relieved by:  Nothing Worsened by:  Nothing Associated symptoms: constipation   Associated symptoms: no anorexia, no chest pain, no chills, no diarrhea, no dysuria, no fatigue, no fever, no flatus, no melena, no nausea, no shortness of breath, no sore throat and no vomiting   Risk factors comment:  IBS      Home Medications Prior to Admission medications   Medication Sig Start Date End Date Taking? Authorizing Provider  Acetaminophen 500 MG capsule Take 1,000 mg by mouth every 6 (six) hours as needed for fever or pain.    [provider]  albuterol (VENTOLIN HFA) 108 (90 Base) MCG/ACT inhaler Inhale 1-2 puffs into the lungs every 6 (six) hours as needed for wheezing or shortness of breath.    [provider]  ALPRAZolam Prudy Feeler) 0.5 MG tablet Take 1 tablet (0.5 mg total) by mouth 4 (four) times daily as needed for anxiety. 07/22/22   Corie Chiquito, PMHNP  atorvastatin (LIPITOR) 40 MG tablet Take 1 tablet (40 mg total) by mouth See admin instructions. Take 40 mg by mouth at 5 PM 06/12/22   Rodolph Bong, MD  b complex vitamins capsule Take 1 capsule by mouth daily.    [provider]  bismuth subsalicylate (PEPTO BISMOL) 262 MG/15ML suspension Take 30 mLs by mouth every 6 (six) hours as needed.    [provider]  calcium carbonate (TUMS EX) 750 MG  chewable tablet Chew 1 tablet by mouth as needed.    [provider]  cholecalciferol (VITAMIN D3) 25 MCG (1000 UNIT) tablet Take 1,000-2,000 Units by mouth See admin instructions. Take 1,000 units by mouth in the morning and 2,000 units at 5 PM    [provider]  CONTOUR NEXT TEST test strip USE TO TEST BLOOD SUGAR TWICE DAILY 08/01/21   Reather Littler, MD  cyclobenzaprine (FLEXERIL) 5 MG tablet Take 5 mg by mouth 3 (three) times daily as needed for muscle spasms.    [provider]  diclofenac (VOLTAREN) 75 MG EC tablet Take 75 mg by mouth 2 (two) times daily. 05/11/22   [provider]  diclofenac Sodium (VOLTAREN) 1 % GEL Apply 4 g topically 4 (four) times daily. Patient taking differently: Apply 4 g topically 4 (four) times daily as needed (for pain- affected areas). 05/21/22   Palumbo, April, MD  dicyclomine (BENTYL) 20 MG tablet Take 1 tablet (20 mg total) by mouth 3 (three) times daily as needed for spasms. 06/17/21   Burnadette Pop, MD  divalproex (DEPAKOTE) 250 MG DR tablet Take 2 tablets (500 mg total) by mouth at bedtime. 06/30/22   Corie Chiquito, PMHNP  ezetimibe (ZETIA) 10 MG tablet Take 1 tablet (10 mg total) by mouth every evening. 06/12/22   Rodolph Bong, MD  fluticasone (FLONASE) 50 MCG/ACT nasal spray Place 1 spray into both nostrils 2 (two)  times daily as needed for allergies or rhinitis.    [provider]  FUROSEMIDE PO Take by mouth as needed.    [provider]  gabapentin (NEURONTIN) 600 MG tablet Take 600 mg by mouth 2 (two) times daily. Take 600 mg by mouth in the morning, at 5 PM, and at bedtime 05/12/19   [provider]  insulin glargine (LANTUS SOLOSTAR) 100 UNIT/ML Solostar Pen ADMINISTER 76 UNITS UNDER THE SKIN AT BEDTIME 07/31/22   Reather Littler, MD  insulin lispro (HUMALOG KWIKPEN) 100 UNIT/ML KwikPen Inject 14-18 Units into the skin 2 (two) times daily before a meal. 07/06/22   Reather Littler, MD  ipratropium  (ATROVENT) 0.06 % nasal spray Place 2 sprays into both nostrils 4 (four) times daily as needed for rhinitis. 06/01/22   [provider]  Ipratropium-Albuterol (COMBIVENT RESPIMAT) 20-100 MCG/ACT AERS respimat Inhale 1 puff into the lungs in the morning, at noon, and at bedtime for 7 days, THEN 1 puff every 6 (six) hours as needed for wheezing. 06/05/22 07/12/22  Rodolph Bong, MD  levothyroxine (SYNTHROID) 75 MCG tablet Take 1 tablet (75 mcg total) by mouth daily before breakfast. 03/26/22   Reather Littler, MD  lidocaine (XYLOCAINE) 2 % solution Use as directed 15 mLs in the mouth or throat as directed. 06/01/22   [provider]  liothyronine (CYTOMEL) 5 MCG tablet TAKE 1 TABLET BY MOUTH DAILY WITH LEVOTHYROXINE  TAKE BEFORE BREAKFAST Patient taking differently: Take 5 mcg by mouth daily before breakfast. 03/26/22   Reather Littler, MD  loperamide (IMODIUM) 2 MG capsule Take 2 capsules (4 mg total) by mouth 3 (three) times daily as needed for diarrhea or loose stools. 11/12/21   Danford, Earl Lites, MD  loratadine (CLARITIN) 10 MG tablet Take 10 mg by mouth daily as needed for allergies.    [provider]  losartan (COZAAR) 25 MG tablet Take 25 mg by mouth See admin instructions. Take 25 mg by mouth at 5 PM 08/31/19   [provider]  metFORMIN (GLUCOPHAGE-XR) 500 MG 24 hr tablet Take 1 tablet (500 mg total) by mouth daily with supper. 08/18/22   Worthy Rancher, MD  Microlet Lancets MISC TEST DAILY AS DIRECTED 08/01/21   Reather Littler, MD  nitroGLYCERIN (NITROSTAT) 0.4 MG SL tablet Place 1 tablet (0.4 mg total) under the tongue every 5 (five) minutes as needed for chest pain. 09/14/17   Robbie Lis M, PA-C  ondansetron (ZOFRAN) 4 MG tablet Take 1 tablet (4 mg total) by mouth every 6 (six) hours. Patient taking differently: Take 4 mg by mouth every 6 (six) hours as needed for nausea or vomiting. 09/20/21   [place] Blizzard, DO  pantoprazole (PROTONIX) 40 MG tablet Take 1  tablet (40 mg total) by mouth daily at 6 (six) AM. 06/06/22   Rodolph Bong, MD  POTASSIUM PO Take by mouth as needed.    [provider]  QUEtiapine (SEROQUEL) 100 MG tablet Take 1 tablet (100 mg total) by mouth at bedtime. 08/11/22 09/10/22  Corie Chiquito, PMHNP  rOPINIRole (REQUIP) 0.5 MG tablet Takes 4 tabs at 5 pm and 3 tabs at bedtime and 1-2 tabs prn 07/23/22   Corie Chiquito, PMHNP  Wheat Dextrin (BENEFIBER PO) Take 4-5 g by mouth daily as needed (for constipation- mix as directed).    [provider]      Allergies    Codeine, Procaine hcl, Epinephrine, Metformin and related, Ozempic (0.25 or 0.5 mg-dose) [semaglutide(0.25 or 0.5mg -dos)], Aspirin,  Augmentin [amoxicillin-pot clavulanate], Benadryl [diphenhydramine], Diflucan [fluconazole], Dilaudid [hydromorphone hcl], Hydromorphone, Morphine, Other, Prednisone, Tramadol hcl, Macrobid [nitrofurantoin], and Sulfa antibiotics    Review of Systems   Review of Systems  Constitutional:  Negative for chills, fatigue and fever.  HENT:  Negative for sore throat.   Respiratory:  Negative for shortness of breath.   Cardiovascular:  Negative for chest pain.  Gastrointestinal:  Positive for abdominal pain and constipation. Negative for anorexia, diarrhea, flatus, melena, nausea and vomiting.  Genitourinary:  Negative for dysuria.    Physical Exam Updated Vital Signs BP 136/75   Pulse 98   Temp 97.9 F (36.6 C) (Oral)   Resp 16   Ht 5' (1.524 m)   Wt 82.6 kg   SpO2 95%   BMI 35.56 kg/m  Physical Exam Vitals and nursing note reviewed.  Constitutional:      General: She is not in acute distress.    Appearance: She is well-developed. She is not ill-appearing.  HENT:     Head: Normocephalic and atraumatic.     Mouth/Throat:     Mouth: Mucous membranes are moist.  Eyes:     Extraocular Movements: Extraocular movements intact.     Conjunctiva/sclera: Conjunctivae normal.  Cardiovascular:     Rate and Rhythm:  Normal rate and regular rhythm.     Heart sounds: Normal heart sounds. No murmur heard. Pulmonary:     Effort: Pulmonary effort is normal. No respiratory distress.     Breath sounds: Normal breath sounds.  Abdominal:     Palpations: Abdomen is soft.     Tenderness: There is generalized abdominal tenderness.  Musculoskeletal:        General: No swelling.     Cervical back: Neck supple.  Skin:    General: Skin is warm and dry.     Capillary Refill: Capillary refill takes less than 2 seconds.  Neurological:     Mental Status: She is alert.  Psychiatric:        Mood and Affect: Mood normal.     ED Results / Procedures / Treatments   Labs (all labs ordered are listed, but only abnormal results are displayed) Labs Reviewed  COMPREHENSIVE METABOLIC PANEL - Abnormal; Notable for the following components:      Result Value   Glucose, Bld 116 (*)    Calcium 10.4 (*)    All other components within normal limits  URINALYSIS, ROUTINE W REFLEX MICROSCOPIC - Abnormal; Notable for the following components:   Color, Urine COLORLESS (*)    Specific Gravity, Urine 1.031 (*)    Leukocytes,Ua TRACE (*)    All other components within normal limits  CBC WITH DIFFERENTIAL/PLATELET  LIPASE, BLOOD    EKG None  Radiology CT ABDOMEN PELVIS W CONTRAST  Result Date: 08/27/2022 CLINICAL DATA:  Abdominal pain and constipation for 10 days. EXAM: CT ABDOMEN AND PELVIS WITH CONTRAST TECHNIQUE: Multidetector CT imaging of the abdomen and pelvis was performed using the standard protocol following bolus administration of intravenous contrast. RADIATION DOSE REDUCTION: This exam was performed according to the departmental dose-optimization program which includes automated exposure control, adjustment of the mA and/or kV according to patient size and/or use of iterative reconstruction technique. CONTRAST:  85mL OMNIPAQUE IOHEXOL 300 MG/ML  SOLN COMPARISON:  07/15/2022 FINDINGS: Lower chest: The heart is within  normal limits in size and stable. No pericardial effusion. The descending thoracic aorta is normal in caliber. The distal esophagus is grossly normal. No acute pulmonary findings. Streaky basilar scarring  changes. 5 mm pulmonary nodule noted at the left lung base. This is unchanged since 2022 and considered benign. Hepatobiliary: No hepatic lesions or intrahepatic biliary dilatation. Gallbladder is mildly contracted. No common bile duct dilatation. Pancreas: No mass, inflammation or ductal dilatation. Spleen: Normal size.  No focal lesions. Adrenals/Urinary Tract: The adrenal glands are normal. Areas of mild renal cortical thinning and scarring type changes involving the right kidney likely related to prior infection. Moderate right-sided hydronephrosis due to a chronic mild UPJ obstruction. Mild left-sided hydronephrosis and mild UPJ obstruction on that side also. No parenchymal renal lesions and no renal or obstructing ureteral calculi. The bladder is grossly normal. It is somewhat obscured by artifact from right hip prosthesis. Stomach/Bowel: The stomach, duodenum, small bowel and colon are unremarkable. No acute inflammatory process, mass lesions or obstructive findings. There are surgical changes involving the sigmoid colon. Scattered diverticulosis but no findings for acute diverticulitis. Vascular/Lymphatic: The aorta is normal in caliber. No dissection. The branch vessels are patent. The major venous structures are patent. No mesenteric or retroperitoneal mass or adenopathy. Small scattered lymph nodes are noted. Reproductive: The uterus is surgically absent. Both ovaries are still present and appear normal. Stable 13 mm right ovarian cyst measuring 8 Hounsfield units and not requiring any further imaging evaluation or follow-up. Other: No pelvic mass or adenopathy. No free pelvic fluid collections. No inguinal mass or adenopathy. No abdominal wall hernia or subcutaneous lesions. Musculoskeletal: No  significant bony findings. Stable scoliosis and degenerative lumbar spondylosis. Total right hip arthroplasty. IMPRESSION: 1. No acute abdominal/pelvic findings, mass lesions or adenopathy. 2. Stable surgical changes involving the sigmoid colon. No stool burden. 3. Stable bilateral UPJ obstructions and stable right renal cortical scarring changes. 4. Stable 5 mm left lower lobe pulmonary nodule since 2022 and considered benign. Electronically Signed   By: Rudie Meyer M.D.   On: 08/27/2022 16:21    Procedures Procedures    Medications Ordered in ED Medications  iohexol (OMNIPAQUE) 300 MG/ML solution 100 mL (85 mLs Intravenous Contrast Given 08/27/22 1557)    ED Course/ Medical Decision Making/ A&P                             Medical Decision Making Amount and/or Complexity of Data Reviewed Labs: ordered. Radiology: ordered.  Risk Prescription drug management.   [name] is here with abdominal pain and constipation.  History of IBS.  She feels bloated.  She has been taking stool softeners without much help.  Differential diagnosis likely constipation/IBS constipation but will rule out obstruction, mass.  Overall she appears well.  No fever or chills.  CBC CMP and lipase and urinalysis were obtained and per my review and interpretation are unremarkable.  CT abdomen and pelvis per radiology report with no acute findings.  Overall recommend titrating MiraLAX for constipation.  Discharged in good condition.  This chart was dictated using voice recognition software.  Despite best efforts to proofread,  errors can occur which can change the documentation meaning.         Final Clinical Impression(s) / ED Diagnoses Final diagnoses:  Abdominal pain, unspecified abdominal location    Rx / DC Orders ED Discharge Orders     None         Virgina Norfolk, DO 08/27/22 1716

## 2022-08-28 ENCOUNTER — Encounter: Payer: Self-pay | Admitting: Endocrinology

## 2022-08-28 ENCOUNTER — Ambulatory Visit (INDEPENDENT_AMBULATORY_CARE_PROVIDER_SITE_OTHER): Payer: Medicare Other | Admitting: Endocrinology

## 2022-08-28 VITALS — BP 126/78 | HR 86 | Ht 60.0 in | Wt 181.0 lb

## 2022-08-28 DIAGNOSIS — E782 Mixed hyperlipidemia: Secondary | ICD-10-CM

## 2022-08-28 DIAGNOSIS — E1165 Type 2 diabetes mellitus with hyperglycemia: Secondary | ICD-10-CM

## 2022-08-28 DIAGNOSIS — R55 Syncope and collapse: Secondary | ICD-10-CM | POA: Diagnosis not present

## 2022-08-28 DIAGNOSIS — Z794 Long term (current) use of insulin: Secondary | ICD-10-CM

## 2022-08-28 DIAGNOSIS — E039 Hypothyroidism, unspecified: Secondary | ICD-10-CM | POA: Diagnosis not present

## 2022-08-28 DIAGNOSIS — R109 Unspecified abdominal pain: Secondary | ICD-10-CM | POA: Diagnosis not present

## 2022-08-28 NOTE — Progress Notes (Signed)
Patient ID: [name] [name], female   DOB: [birth year], 77 y.o.   MRN: [id number]            Reason for Appointment: Follow-up    History of Present Illness:          Date of diagnosis of type 2 diabetes mellitus: ?  2014        Background history:   She thinks her blood sugar was 400 at the time of diagnosis but no detailed records of this are available She did have an A1c of 10.6 done in 2014 and was probably given Lantus for some time initially Apparently she was treated with various medications including metformin, Janumet and Tradgenta. Her blood sugars had improved and A1c in 2015 was down to 6.2 She tends to have diarrhea with metformin and Janumet and also she thinks it causes dry mouth Most likely Janumet was stopped in 06/2015 Glipizide was started in 8/17 when blood sugars were higher and A1c was 9%  Recent history:   INSULIN regimen is:   Lantus 76 units daily at 5 pm, Humalog 0 units at meals  Non-insulin hypoglycemic drugs the patient is taking are: None  Her A1c is 6.7,   Current management, blood sugar patterns and problems identified:  On her last visit she had been having higher sugars from steroids and respiratory infections However blood sugars are much better now This is despite her not taking Humalog on her own since she thought her blood sugars were better She does still have high readings after dinner at night but not consistently She had been recommended to V-go pump but she did not pursue this Lantus was increased to 76 from 70 on the last visit She also thinks that recently blood sugars are better from her reducing Seroquel and some other psychiatric medications Weight is about the same Although fasting readings are still high her blood sugars seem to be the lowest before dinner A1c is slightly better now She is walking a little now   Side effects from medications have been:?  Diarrhea from metformin and Janumet, Jardiance caused yeast  infections, abdominal discomfort from Ozempic   Interpretation of her freestyle libre version 2 download for the last 2 weeks shows the following  GMI is 6.6 She has episodes of hyperglycemia periodically overnight, after late morning meal or late evening but not consistent and average blood sugars are generally under 180 at the highest  Overnight blood sugars are mildly increased on an average in the 140s  On an average postprandial readings are rising mostly after dinner but continue to rise till midnight Lowest blood sugars are around 6 PM and averages are in the diagram below Only mild variability is present Time in range is much better at 89 compared to 49% before No hypoglycemia      Previously:   CGM use % of time 75  2-week average/GV 188/26  Time in range 49  % Time Above 180 40  % Time above 250 11  % Time Below 70      PRE-MEAL Fasting Lunch Dinner Bedtime Overall  Glucose range:       Averages: 197  167     POST-MEAL PC Breakfast PC Lunch PC Dinner  Glucose range:     Averages: 192 200 194     Typical meal intake: Breakfast at 9,  May be eggs and sausage and lunch is usually a sandwich   Dinner 7 pm  Dietician visit, most recent: 10/18               Weight history:   Wt Readings from Last 3 Encounters:  08/28/22 181 lb (82.1 kg)  08/27/22 182 lb 1.6 oz (82.6 kg)  08/18/22 177 lb (80.3 kg)    Glycemic control:   Lab Results  Component Value Date   HGBA1C 6.7 (H) 08/26/2022   HGBA1C 7.2 (H) 05/06/2022   HGBA1C 6.8 (H) 12/26/2021   Lab Results  Component Value Date   MICROALBUR <0.7 12/26/2021   LDLCALC 72 05/06/2022   CREATININE 0.87 08/27/2022   Lab Results  Component Value Date   MICRALBCREAT 1.6 12/26/2021      Allergies as of 08/28/2022       Reactions   Codeine Anxiety, Other (See Comments)   Hallucinations, tolerates oxycodone    Procaine Hcl Palpitations   Epinephrine Palpitations, Other (See Comments)    Makes heart race at the dentist's   Metformin And Related Nausea Only   Ozempic (0.25 Or 0.5 Mg-dose) [semaglutide(0.25 Or 0.5mg -dos)] Nausea Only   Aspirin Nausea And Vomiting, Other (See Comments)   Burns the stomach   Augmentin [amoxicillin-pot Clavulanate] Diarrhea   Benadryl [diphenhydramine] Other (See Comments)   Per MD "inhibits potency of gabapentin, lithium etc"   Diflucan [fluconazole] Other (See Comments)   Unknown reaction   Dilaudid [hydromorphone Hcl] Other (See Comments)   Migraines and nightmares    Hydromorphone Other (See Comments)   Reaction unconfirmed   Morphine Other (See Comments)   headache   Other Nausea And Vomiting, Other (See Comments)   Pt states that all -mycins cause N/V. (MACROLIDES)  Exposure to steroids MUST BE LIMITED (or, mental status change/manic behavior happens)   Prednisone    Long term steroid problems with lithium and blood sugar.    Tramadol Hcl Other (See Comments)   Made the patient "feel weird"   Macrobid [nitrofurantoin] Rash, Other (See Comments)   Rash around ankles   Sulfa Antibiotics Other (See Comments)   Unknown reaction        Medication List        Accurate as of August 28, 2022 11:59 PM. If you have any questions, ask your nurse or doctor.          Acetaminophen 500 MG capsule Take 1,000 mg by mouth every 6 (six) hours as needed for fever or pain.   albuterol 108 (90 Base) MCG/ACT inhaler Commonly known as: VENTOLIN HFA Inhale 1-2 puffs into the lungs every 6 (six) hours as needed for wheezing or shortness of breath.   ALPRAZolam 0.5 MG tablet Commonly known as: XANAX Take 1 tablet (0.5 mg total) by mouth 4 (four) times daily as needed for anxiety.   atorvastatin 40 MG tablet Commonly known as: LIPITOR Take 1 tablet (40 mg total) by mouth See admin instructions. Take 40 mg by mouth at 5 PM   b complex vitamins capsule Take 1 capsule by mouth daily.   BENEFIBER PO Take 4-5 g by mouth daily as needed  (for constipation- mix as directed).   bismuth subsalicylate 262 MG/15ML suspension Commonly known as: PEPTO BISMOL Take 30 mLs by mouth every 6 (six) hours as needed.   calcium carbonate 750 MG chewable tablet Commonly known as: TUMS EX Chew 1 tablet by mouth as needed.   cholecalciferol 25 MCG (1000 UNIT) tablet Commonly known as: VITAMIN D3 Take 1,000-2,000 Units by mouth See admin instructions. Take 1,000 units by mouth in  the morning and 2,000 units at 5 PM   Combivent Respimat 20-100 MCG/ACT Aers respimat Generic drug: Ipratropium-Albuterol Inhale 1 puff into the lungs in the morning, at noon, and at bedtime for 7 days, THEN 1 puff every 6 (six) hours as needed for wheezing. Start taking on: June 05, 2022   Contour Next Test test strip Generic drug: glucose blood USE TO TEST BLOOD SUGAR TWICE DAILY   cyclobenzaprine 5 MG tablet Commonly known as: FLEXERIL Take 5 mg by mouth 3 (three) times daily as needed for muscle spasms.   diclofenac 75 MG EC tablet Commonly known as: VOLTAREN Take 75 mg by mouth 2 (two) times daily.   diclofenac Sodium 1 % Gel Commonly known as: Voltaren Apply 4 g topically 4 (four) times daily. What changed:  when to take this reasons to take this   dicyclomine 20 MG tablet Commonly known as: BENTYL Take 1 tablet (20 mg total) by mouth 3 (three) times daily as needed for spasms.   divalproex 250 MG DR tablet Commonly known as: DEPAKOTE Take 2 tablets (500 mg total) by mouth at bedtime.   ezetimibe 10 MG tablet Commonly known as: Zetia Take 1 tablet (10 mg total) by mouth every evening.   fluticasone 50 MCG/ACT nasal spray Commonly known as: FLONASE Place 1 spray into both nostrils 2 (two) times daily as needed for allergies or rhinitis.   FUROSEMIDE PO Take by mouth as needed.   gabapentin 600 MG tablet Commonly known as: NEURONTIN Take 600 mg by mouth 2 (two) times daily. Take 600 mg by mouth in the morning, at 5 PM, and at  bedtime   insulin lispro 100 UNIT/ML KwikPen Commonly known as: HumaLOG KwikPen Inject 14-18 Units into the skin 2 (two) times daily before a meal.   ipratropium 0.06 % nasal spray Commonly known as: ATROVENT Place 2 sprays into both nostrils 4 (four) times daily as needed for rhinitis.   Lantus SoloStar 100 UNIT/ML Solostar Pen Generic drug: insulin glargine ADMINISTER 76 UNITS UNDER THE SKIN AT BEDTIME   levothyroxine 75 MCG tablet Commonly known as: SYNTHROID Take 1 tablet (75 mcg total) by mouth daily before breakfast.   lidocaine 2 % solution Commonly known as: XYLOCAINE Use as directed 15 mLs in the mouth or throat as directed.   liothyronine 5 MCG tablet Commonly known as: CYTOMEL TAKE 1 TABLET BY MOUTH DAILY WITH LEVOTHYROXINE  TAKE BEFORE BREAKFAST What changed:  how much to take how to take this when to take this additional instructions   loperamide 2 MG capsule Commonly known as: IMODIUM Take 2 capsules (4 mg total) by mouth 3 (three) times daily as needed for diarrhea or loose stools.   loratadine 10 MG tablet Commonly known as: CLARITIN Take 10 mg by mouth daily as needed for allergies.   losartan 25 MG tablet Commonly known as: COZAAR Take 25 mg by mouth See admin instructions. Take 25 mg by mouth at 5 PM   metFORMIN 500 MG 24 hr tablet Commonly known as: GLUCOPHAGE-XR Take 1 tablet (500 mg total) by mouth daily with supper.   Microlet Lancets Misc TEST DAILY AS DIRECTED   nitroGLYCERIN 0.4 MG SL tablet Commonly known as: NITROSTAT Place 1 tablet (0.4 mg total) under the tongue every 5 (five) minutes as needed for chest pain.   ondansetron 4 MG tablet Commonly known as: ZOFRAN Take 1 tablet (4 mg total) by mouth every 6 (six) hours. What changed:  when to take this reasons  to take this   pantoprazole 40 MG tablet Commonly known as: PROTONIX Take 1 tablet (40 mg total) by mouth daily at 6 (six) AM.   POTASSIUM PO Take by mouth as  needed.   QUEtiapine 100 MG tablet Commonly known as: SEROQUEL Take 1 tablet (100 mg total) by mouth at bedtime.   rOPINIRole 0.5 MG tablet Commonly known as: REQUIP Takes 4 tabs at 5 pm and 3 tabs at bedtime and 1-2 tabs prn        Allergies:  Allergies  Allergen Reactions   Codeine Anxiety and Other (See Comments)    Hallucinations, tolerates oxycodone    Procaine Hcl Palpitations   Epinephrine Palpitations and Other (See Comments)    Makes heart race at the dentist's    Metformin And Related Nausea Only   Ozempic (0.25 Or 0.5 Mg-Dose) [Semaglutide(0.25 Or 0.5mg -Dos)] Nausea Only   Aspirin Nausea And Vomiting and Other (See Comments)    Burns the stomach   Augmentin [Amoxicillin-Pot Clavulanate] Diarrhea   Benadryl [Diphenhydramine] Other (See Comments)    Per MD "inhibits potency of gabapentin, lithium etc"   Diflucan [Fluconazole] Other (See Comments)    Unknown reaction    Dilaudid [Hydromorphone Hcl] Other (See Comments)    Migraines and nightmares    Hydromorphone Other (See Comments)    Reaction unconfirmed   Morphine Other (See Comments)    headache   Other Nausea And Vomiting and Other (See Comments)    Pt states that all -mycins cause N/V. (MACROLIDES)   Exposure to steroids MUST BE LIMITED (or, mental status change/manic behavior happens)   Prednisone     Long term steroid problems with lithium and blood sugar.    Tramadol Hcl Other (See Comments)    Made the patient "feel weird"   Macrobid [Nitrofurantoin] Rash and Other (See Comments)    Rash around ankles   Sulfa Antibiotics Other (See Comments)    Unknown reaction    Past Medical History:  Diagnosis Date   Alcohol abuse    Anxiety    takes Valium daily as needed and Ativan daily   Arthritis    Bilateral hearing loss    Bipolar 1 disorder ([name])    takes Lithium nightly and Synthroid daily   CFS (chronic fatigue syndrome)    Chewing difficulty    Chronic back pain    DDD; "all over"  (09/14/2017)   Colitis, ischemic ([name]) 2012   Confusion    r/t meds   Constipation    Depression    takes Prozac daily and Bupspirone    Diabetes ([name])    Diverticulosis    Dyslipidemia    takes Crestor daily   Edema of both lower extremities    Fibromyalgia    Gastroparesis    Headache    "weekly" (09/14/2017)   Hepatic steatosis 06/18/2012   severe   Hyperlipidemia    Hypertension    Hypothyroidism    IBS (irritable bowel syndrome)    Ischemic colitis ([name])    Joint pain    Joint swelling    Lupus erythematosus tumidus    tumid-skin   Migraine    "1-2/yr; maybe" (09/14/2017)   Numbness    in right foot   Osteoarthritis    "all over" (09/14/2017)   Osteoarthritis cervical spine    Osteoarthritis of hand    bilateral   Pneumonia    "walking pneumonia several times; long time since the last time" (09/14/2017)   Restless leg syndrome  takes Requip nightly   Sciatica    Swallowing difficulty    Type II diabetes mellitus ([name])    Urinary frequency    Urinary leakage    Urinary urgency    Urinary, incontinence, stress female    Walking pneumonia    last time more than 95yrs ago    Past Surgical History:  Procedure Laterality Date   ABDOMINAL HYSTERECTOMY     "they left my ovaries"   APPENDECTOMY     BALLOON DILATION N/A 06/14/2020   Procedure: BALLOON DILATION;  Surgeon: Kerin Salen, MD;  Location: [facility];  Service: Gastroenterology;  Laterality: N/A;   BIOPSY  06/14/2020   Procedure: BIOPSY;  Surgeon: Kerin Salen, MD;  Location: [facility];  Service: Gastroenterology;;   CARDIAC CATHETERIZATION  09/14/2017   COLON RESECTION  05/2021   COLONOSCOPY     DENTAL SURGERY Left 10/2016   dental implant   DILATION AND CURETTAGE OF UTERUS  X 4   ESOPHAGOGASTRODUODENOSCOPY     ESOPHAGOGASTRODUODENOSCOPY (EGD) WITH PROPOFOL N/A 06/14/2020   Procedure: ESOPHAGOGASTRODUODENOSCOPY (EGD) WITH PROPOFOL;  Surgeon: Kerin Salen, MD;  Location: [facility];  Service:  Gastroenterology;  Laterality: N/A;   FLEXIBLE SIGMOIDOSCOPY N/A 06/21/2012   Procedure: FLEXIBLE SIGMOIDOSCOPY;  Surgeon: Beverley Fiedler, MD;  Location: [facility];  Service: Gastroenterology;  Laterality: N/A;   JOINT REPLACEMENT     LEFT HEART CATH AND CORONARY ANGIOGRAPHY N/A 09/14/2017   Procedure: LEFT HEART CATH AND CORONARY ANGIOGRAPHY;  Surgeon: Lyn Records, MD;  Location: [facility];  Service: Cardiovascular;  Laterality: N/A;   SHOULDER ARTHROSCOPY Right    "shaved spurs off rotator cuff"   TONSILLECTOMY     TOTAL HIP ARTHROPLASTY Right 06/09/2013   Procedure: TOTAL HIP ARTHROPLASTY;  Surgeon: Nestor Lewandowsky, MD;  Location: [facility];  Service: Orthopedics;  Laterality: Right;   TOTAL KNEE ARTHROPLASTY Left 02/07/2019   Procedure: TOTAL KNEE ARTHROPLASTY;  Surgeon: Durene Romans, MD;  Location: [facility];  Service: Orthopedics;  Laterality: Left;  70 mins   TUBAL LIGATION     TUMOR EXCISION Right 1968   angle of jaw; benign    Family History  Problem Relation Age of Onset   Drug abuse Mother    Alcohol abuse Mother    High Cholesterol Mother    Depression Mother    Anxiety disorder Mother    Bipolar disorder Mother    Obesity Mother    Alcohol abuse Father    Hypertension Father    High Cholesterol Father    CAD Brother    Hypertension Brother    Alcohol abuse Brother    Hypertension Brother    Hypertension Brother    Psoriasis Daughter    Arthritis Daughter        psoriatic arthritis    Alcohol abuse Grandchild     Social History:  reports that she has never smoked. She has never used smokeless tobacco. She reports that she does not currently use alcohol. She reports that she does not currently use drugs after having used the following drugs: Benzodiazepines. Frequency: 7.00 times per week.   Review of Systems    Lipid history: Hypercholesterolemia, on Lipitor 40 mg   Triglycerides over at baseline 400 and better now but recent labs were  nonfasting  LDL is controlled with adding Zetia    Lab Results  Component Value Date   CHOL 175 08/26/2022   CHOL 159 05/06/2022   CHOL 168 12/26/2021   Lab  Results  Component Value Date   HDL 47.50 08/26/2022   HDL 58 05/06/2022   HDL 60.90 12/26/2021   Lab Results  Component Value Date   LDLCALC 72 05/06/2022   LDLCALC 79 08/09/2020   Lab Results  Component Value Date   TRIG 289.0 (H) 08/26/2022   TRIG 175 (H) 05/06/2022   TRIG 234.0 (H) 12/26/2021   Lab Results  Component Value Date   CHOLHDL 4 08/26/2022   CHOLHDL 3 12/26/2021   CHOLHDL 5 10/06/2021   Lab Results  Component Value Date   LDLDIRECT 95.0 08/26/2022   LDLDIRECT 84.0 12/26/2021   LDLDIRECT 148.0 10/06/2021            ?  Hypertension: Currently only on 25 mg losartan  BP Readings from Last 3 Encounters:  08/28/22 126/78  08/27/22 136/75  08/18/22 138/82     Most recent foot exam: 6/21  THYROID: She previously had been on levothyroxine and Cytomel from her psychiatrist for several years with uncertain diagnosis She is taking levothyroxine 75 g as also liothyronine 5 mcg She previously had a low T3 level when her liothyronine was stopped Overall she feels good  Previously on lithium  Her TSH is consistently normal Free T3 and free T4 normal as of 6/24 and about the same as before  Labs as follows:  Lab Results  Component Value Date   TSH 0.89 08/26/2022   TSH 0.790 05/06/2022   TSH 0.67 10/06/2021   FREET4 0.70 08/26/2022   FREET4 0.76 10/06/2021   FREET4 0.68 07/06/2021   Lab Results  Component Value Date   T3FREE 2.7 08/26/2022   T3FREE 2.8 10/06/2021   T3FREE 3.2 03/25/2021   T3FREE 2.2 (L) 12/24/2020   T3FREE 3.2 08/28/2019    She has atypical depression on multiple drugs, has been on Seroquel for quite some time  THYROID nodule:  She was found to have a nodule in her thyroid isthmus in 8/20 Ultrasound done in 1/21 showed multinodular goiter with only 1  significant nodule   The nodule in the isthmus was biopsied on the recommendation of her PCP in 3/22 and that showed benign follicular nodule  She has had mild/variable hypercalcemia, last levels:  Lab Results  Component Value Date   PTH 35 10/25/2018   CALCIUM 10.4 (H) 08/27/2022   CAION 1.30 07/14/2016   PHOS 2.9 12/26/2010   She reportedly has GASTROPARESIS but is not on treatment Taking medications as needed for nausea  She is on Namenda and Aricept for dementia  Physical Examination:  BP 126/78 (BP Location: Left Arm, Patient Position: Sitting, Cuff Size: Large)   Pulse 86   Ht 5' (1.524 m)   Wt 181 lb (82.1 kg)   SpO2 98%   BMI 35.35 kg/m         ASSESSMENT:  Diabetes type 2, on insulin  See history of present illness for detailed discussion of current diabetes management, blood sugar patterns, monitor download and problems identified  She is on 76 units Lantus insulin as only treatment now With her reducing some of her psychiatric medications including Seroquel blood sugars are better She was also doing some walking Currently not using mealtime coverage and her postprandial readings are somewhat variable and not consistently high Not able to tolerate any non-insulin hypoglycemic drugs She is intolerant to GLP-1 drugs because of nausea and underlying gastroparesis  Her A1c is improved at 6.7 GMI 6.6 on her sensor  HYPOTHYROIDISM: This is likely secondary and taking  Synthroid and Cytomel as before with normal thyroid levels, subjectively doing well also  HYPERLIPIDEMIA: Nonfasting triglycerides are high but LDL is controlled  PLAN:   No change in Lantus  If the morning sugars start coming down lower into the 90s he can start reducing Lantus by 4 units  However if her blood sugars are consistently getting over 180 she will need to start Humalog at least 4 larger meals  Increase walking as tolerated  Hold off on insulin pump as long as A1c is below  7  No change in thyroid medications  There are no Patient Instructions on file for this visit.     Reather Littler 08/31/2022, 11:20 AM   Note: This office note was prepared with Dragon voice recognition system technology. Any transcriptional errors that result from this process are unintentional.

## 2022-09-01 ENCOUNTER — Ambulatory Visit (INDEPENDENT_AMBULATORY_CARE_PROVIDER_SITE_OTHER): Payer: Medicare Other | Admitting: Internal Medicine

## 2022-09-01 DIAGNOSIS — I10 Essential (primary) hypertension: Secondary | ICD-10-CM | POA: Diagnosis not present

## 2022-09-01 DIAGNOSIS — F319 Bipolar disorder, unspecified: Secondary | ICD-10-CM | POA: Diagnosis not present

## 2022-09-01 DIAGNOSIS — G4700 Insomnia, unspecified: Secondary | ICD-10-CM | POA: Diagnosis not present

## 2022-09-01 DIAGNOSIS — G4733 Obstructive sleep apnea (adult) (pediatric): Secondary | ICD-10-CM | POA: Diagnosis not present

## 2022-09-02 ENCOUNTER — Telehealth: Payer: Self-pay | Admitting: Psychiatry

## 2022-09-02 NOTE — Telephone Encounter (Signed)
Patient said the sleep specialist deferred to you when she could drive.  Below is note from her. Patient did report having daytime somnolence sometimes.  Will reiterate to patient.   I was honest with her today that I don't know if or when I will be comfortable with her driving again. In addition to moderate-severe OSA, she is taking multiple sedatives throughout the day. Additionally, from our discussion today, she seemed to also have trouble with cognition and memory. Husband was present for entire visit.

## 2022-09-02 NOTE — Telephone Encounter (Signed)
Pt  lvm that she wants to talk to the nurse. Please call her at [phone]

## 2022-09-02 NOTE — Telephone Encounter (Signed)
Patient called to report she got results from sleep study yesterday at [facility]. It is reported that she needs a CPAP study to F/U to determine if she can drive. Patient reports study showed she quit breathing 25 times an hour and her O2 sat dropped to 69%. Other results can be seen in Care Everywhere. She is asking when she can drive.

## 2022-09-03 ENCOUNTER — Ambulatory Visit (INDEPENDENT_AMBULATORY_CARE_PROVIDER_SITE_OTHER): Payer: Medicare Other | Admitting: Internal Medicine

## 2022-09-03 DIAGNOSIS — G8929 Other chronic pain: Secondary | ICD-10-CM | POA: Diagnosis not present

## 2022-09-03 DIAGNOSIS — M5416 Radiculopathy, lumbar region: Secondary | ICD-10-CM | POA: Diagnosis not present

## 2022-09-03 DIAGNOSIS — M48061 Spinal stenosis, lumbar region without neurogenic claudication: Secondary | ICD-10-CM | POA: Diagnosis not present

## 2022-09-03 NOTE — Telephone Encounter (Signed)
Patient notified

## 2022-09-04 ENCOUNTER — Encounter: Payer: Self-pay | Admitting: Endocrinology

## 2022-09-06 ENCOUNTER — Other Ambulatory Visit (INDEPENDENT_AMBULATORY_CARE_PROVIDER_SITE_OTHER): Payer: Self-pay | Admitting: Internal Medicine

## 2022-09-06 DIAGNOSIS — B354 Tinea corporis: Secondary | ICD-10-CM | POA: Diagnosis not present

## 2022-09-06 DIAGNOSIS — B356 Tinea cruris: Secondary | ICD-10-CM | POA: Diagnosis not present

## 2022-09-06 DIAGNOSIS — E1165 Type 2 diabetes mellitus with hyperglycemia: Secondary | ICD-10-CM

## 2022-09-08 ENCOUNTER — Other Ambulatory Visit (INDEPENDENT_AMBULATORY_CARE_PROVIDER_SITE_OTHER): Payer: Self-pay | Admitting: Internal Medicine

## 2022-09-08 ENCOUNTER — Encounter (INDEPENDENT_AMBULATORY_CARE_PROVIDER_SITE_OTHER): Payer: Self-pay | Admitting: Internal Medicine

## 2022-09-08 ENCOUNTER — Ambulatory Visit (INDEPENDENT_AMBULATORY_CARE_PROVIDER_SITE_OTHER): Payer: Medicare Other | Admitting: Psychiatry

## 2022-09-08 ENCOUNTER — Encounter: Payer: Self-pay | Admitting: Psychiatry

## 2022-09-08 ENCOUNTER — Ambulatory Visit (INDEPENDENT_AMBULATORY_CARE_PROVIDER_SITE_OTHER): Payer: Medicare Other | Admitting: Internal Medicine

## 2022-09-08 VITALS — BP 130/84 | HR 90 | Temp 98.3°F | Ht 60.0 in | Wt 178.0 lb

## 2022-09-08 DIAGNOSIS — Z6834 Body mass index (BMI) 34.0-34.9, adult: Secondary | ICD-10-CM

## 2022-09-08 DIAGNOSIS — E1165 Type 2 diabetes mellitus with hyperglycemia: Secondary | ICD-10-CM | POA: Diagnosis not present

## 2022-09-08 DIAGNOSIS — F319 Bipolar disorder, unspecified: Secondary | ICD-10-CM

## 2022-09-08 DIAGNOSIS — G4733 Obstructive sleep apnea (adult) (pediatric): Secondary | ICD-10-CM | POA: Diagnosis not present

## 2022-09-08 DIAGNOSIS — Z794 Long term (current) use of insulin: Secondary | ICD-10-CM | POA: Diagnosis not present

## 2022-09-08 DIAGNOSIS — F411 Generalized anxiety disorder: Secondary | ICD-10-CM

## 2022-09-08 DIAGNOSIS — E669 Obesity, unspecified: Secondary | ICD-10-CM

## 2022-09-08 DIAGNOSIS — I10 Essential (primary) hypertension: Secondary | ICD-10-CM | POA: Diagnosis not present

## 2022-09-08 DIAGNOSIS — Z7984 Long term (current) use of oral hypoglycemic drugs: Secondary | ICD-10-CM

## 2022-09-08 DIAGNOSIS — F5101 Primary insomnia: Secondary | ICD-10-CM

## 2022-09-08 MED ORDER — QUETIAPINE FUMARATE 25 MG PO TABS
ORAL_TABLET | ORAL | 1 refills | Status: DC
Start: 2022-09-08 — End: 2022-10-07

## 2022-09-08 MED ORDER — DIVALPROEX SODIUM 250 MG PO DR TAB: 500.0000 mg | DELAYED_RELEASE_TABLET | Freq: Every day | ORAL | 0 refills | Status: DC

## 2022-09-08 MED ORDER — METFORMIN HCL ER 500 MG PO TB24: 500.0000 mg | ORAL_TABLET | Freq: Two times a day (BID) | ORAL | 0 refills | Status: DC

## 2022-09-08 NOTE — Assessment & Plan Note (Signed)
Blood pressure at goal for age and comorbid conditions,  on losartan 25 mg a day without adverse effects.  Reviewed most recent renal parameters which showed a GFR of 62 and normal electrolytes.  She will continue on current regimen.  She will monitor for symptoms of orthostasis.  She was also advised to maintain adequate hydration

## 2022-09-08 NOTE — Assessment & Plan Note (Signed)
Most recent hemoglobin A1c was 6.7.  Patient also reports an improvement in blood sugars.  She was recently seen by endocrinology.  She is currently on Lantus and is tolerating metformin once a day.  We will increase metformin to twice a day.  Patient will monitor for hypoglycemia.
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
## 2022-09-08 NOTE — Progress Notes (Signed)
Office: [phone]  /  Fax: [phone]  WEIGHT SUMMARY AND BIOMETRICS  Vitals Temp: 98.3 F (36.8 C) BP: 130/84 Pulse Rate: 90 SpO2: 93 %   Anthropometric Measurements Height: 5' (1.524 m) Weight: 178 lb (80.7 kg) BMI (Calculated): 34.76 Weight at Last Visit: 177lb Weight Lost Since Last Visit: 0 Weight Gained Since Last Visit: 1lb Starting Weight: 191lb Total Weight Loss (lbs): 13 lb (5.897 kg) Peak Weight: 194lb   Body Composition  Body Fat %: 45.7 % Fat Mass (lbs): 81.4 lbs Muscle Mass (lbs): 91.6 lbs Total Body Water (lbs): 66.4 lbs Visceral Fat Rating : 15    No data recorded Today's Visit #: 8  Starting Date: 05/06/22   HPI  Chief Complaint: OBESITY  [name] [name] is here to discuss her progress with her obesity treatment plan. She is on the the Category 2 Plan and states she is following her eating plan approximately 50 % of the time. She states she is exercising by walking  10 minutes 3-4 times per week.  Interval History:  Since last office visit she has lost 1 lb. She brought in with her several packages of frozen meals.  She has been doing lean cuisine and [employer] and is asking if these are healthy options for her.  I reviewed labels with her and showed her how to evaluate for saturated fats, sodium as well as fiber.  These meals are very high on saturated fats and sodium.  We discussed trying healthy choices protein goals. She reports good adherence to reduced calorie nutritional plan. She has been working on not skipping meals, increasing protein intake at every meal, eating more vegetables, drinking more water, and avoiding and or reducing liquid calories Saw endo, asked about metformin. Had sleep study 29 AHI, 5 hours o2 desaturations. BG iproved on lantus + metfromin.  Orixegenic Control: Denies problems with appetite and hunger signals.  Denies problems with satiety and satiation.  Denies problems with eating patterns and portion control.   Denies abnormal cravings. Denies feeling deprived or restricted.   Barriers identified: having difficulty preparing healthy meals, having difficulty with meal prep and planning, medical comorbidities, and presence of obesogenic drugs.   Pharmacotherapy for weight loss: She is currently taking Metformin (off label use for incretin effect and / or insulin resistance and / or diabetes prevention) with adequate clinical response  and without side effects..    ASSESSMENT AND PLAN  TREATMENT PLAN FOR OBESITY:  Recommended Dietary Goals  [name] [name] is currently in the action stage of change. As such, her goal is to continue weight management plan. She has agreed to: continue current plan  Behavioral Intervention  We discussed the following Behavioral Modification Strategies today: increasing lean protein intake, decreasing simple carbohydrates , increasing vegetables, increasing lower glycemic fruits, increasing fiber rich foods, increasing water intake, continue to practice mindfulness when eating, and planning for success.  Additional resources provided today: None  Recommended Physical Activity Goals  [name] [name] has been advised to work up to 150 minutes of moderate intensity aerobic activity a week and strengthening exercises 2-3 times per week for cardiovascular health, weight loss maintenance and preservation of muscle mass.   She has agreed to :  Increase the intensity, frequency or duration of aerobic exercises    Pharmacotherapy We discussed various medication options to help [name] [name] with her weight loss efforts and we both agreed to : increase metformin to twice a day  ASSOCIATED CONDITIONS ADDRESSED TODAY  Obesity with current BMI of 34.9  Type  2 diabetes mellitus with hyperglycemia, with long-term current use of insulin ([name]) Assessment & Plan: Most recent hemoglobin A1c was 6.7.  Patient also reports an improvement in blood sugars.  She was recently seen by endocrinology.   She is currently on Lantus and is tolerating metformin once a day.  We will increase metformin to twice a day.  Patient will monitor for hypoglycemia.  Orders: -     metFORMIN HCl ER; Take 1 tablet (500 mg total) by mouth 2 (two) times daily with a meal.  Dispense: 60 tablet; Refill: 0  OSA (obstructive sleep apnea) Assessment & Plan: Patient reports having a sleep study with an AHI of 29 and oxygen desaturations.  I will review records further.  It seems that she is in the process of getting PAP therapy.  We reviewed the importance of adherence and working with vendor to find adequate mask to improve adherence and tolerance.  Losing 15% of body weight may improve condition.  Psychiatry has also been titrating her medications down.   Essential hypertension Assessment & Plan: Blood pressure at goal for age and comorbid conditions,  on losartan 25 mg a day without adverse effects.  Reviewed most recent renal parameters which showed a GFR of 62 and normal electrolytes.  She will continue on current regimen.  She will monitor for symptoms of orthostasis.  She was also advised to maintain adequate hydration      PHYSICAL EXAM:  Blood pressure 130/84, pulse 90, temperature 98.3 F (36.8 C), height 5' (1.524 m), weight 178 lb (80.7 kg), SpO2 93 %. Body mass index is 34.76 kg/m.  General: She is overweight, cooperative, alert, well developed, and in no acute distress. PSYCH: Has normal mood, affect and thought process.   HEENT: EOMI, sclerae are anicteric. Lungs: Normal breathing effort, no conversational dyspnea. Extremities: No edema.  Neurologic: No gross sensory or motor deficits. No tremors or fasciculations noted.    DIAGNOSTIC DATA REVIEWED:  BMET    Component Value Date/Time   NA 139 08/27/2022 1527   NA 140 01/27/2021 1526   K 4.4 08/27/2022 1527   CL 106 08/27/2022 1527   CO2 26 08/27/2022 1527   GLUCOSE 116 (H) 08/27/2022 1527   BUN 15 08/27/2022 1527   BUN 8 01/27/2021  1526   CREATININE 0.87 08/27/2022 1527   CREATININE 0.94 07/20/2022 1640   CALCIUM 10.4 (H) 08/27/2022 1527   GFRNONAA >60 08/27/2022 1527   GFRNONAA 57 (L) 11/17/2017 0000   GFRAA >60 12/10/2019 0657   GFRAA 67 11/17/2017 0000   Lab Results  Component Value Date   HGBA1C 6.7 (H) 08/26/2022   HGBA1C 5.5 12/25/2010   No results found for: "INSULIN" Lab Results  Component Value Date   TSH 0.89 08/26/2022   CBC    Component Value Date/Time   WBC 10.0 08/27/2022 1527   RBC 4.96 08/27/2022 1527   HGB 14.2 08/27/2022 1527   HGB 14.0 01/27/2021 1526   HCT 44.5 08/27/2022 1527   HCT 41.5 01/27/2021 1526   PLT 223 08/27/2022 1527   PLT 255 01/27/2021 1526   MCV 89.7 08/27/2022 1527   MCV 87 01/27/2021 1526   MCH 28.6 08/27/2022 1527   MCHC 31.9 08/27/2022 1527   RDW 14.1 08/27/2022 1527   RDW 13.6 01/27/2021 1526   Iron Studies    Component Value Date/Time   FERRITIN 43 07/06/2015 0459   Lipid Panel     Component Value Date/Time   CHOL 175 08/26/2022 1010  CHOL 159 05/06/2022 0948   TRIG 289.0 (H) 08/26/2022 1010   HDL 47.50 08/26/2022 1010   HDL 58 05/06/2022 0948   CHOLHDL 4 08/26/2022 1010   VLDL 57.8 (H) 08/26/2022 1010   LDLCALC 72 05/06/2022 0948   LDLDIRECT 95.0 08/26/2022 1010   Hepatic Function Panel     Component Value Date/Time   PROT 6.7 08/27/2022 1527   PROT 6.4 01/27/2021 1526   ALBUMIN 4.1 08/27/2022 1527   ALBUMIN 4.4 01/27/2021 1526   AST 17 08/27/2022 1527   ALT 21 08/27/2022 1527   ALKPHOS 76 08/27/2022 1527   BILITOT 0.3 08/27/2022 1527   BILITOT 0.4 01/27/2021 1526   BILIDIR 0.2 06/14/2021 0406   IBILI 0.5 06/14/2021 0406      Component Value Date/Time   TSH 0.89 08/26/2022 1010   Nutritional Lab Results  Component Value Date   VD25OH 37.1 05/06/2022   VD25OH 21 (L) 07/04/2012     Return in about 3 weeks (around 09/29/2022) for For Weight Mangement with Dr. Rikki Spearing.Marland Kitchen She was informed of the importance of frequent follow  up visits to maximize her success with intensive lifestyle modifications for her multiple health conditions.   ATTESTASTION STATEMENTS:  Reviewed by clinician on day of visit: allergies, medications, problem list, medical history, surgical history, family history, social history, and previous encounter notes.     Worthy Rancher, MD

## 2022-09-08 NOTE — Assessment & Plan Note (Signed)
Patient reports having a sleep study with an AHI of 29 and oxygen desaturations.  I will review records further.  It seems that she is in the process of getting PAP therapy.  We reviewed the importance of adherence and working with vendor to find adequate mask to improve adherence and tolerance.  Losing 15% of body weight may improve condition.  Psychiatry has also been titrating her medications down.

## 2022-09-29 ENCOUNTER — Encounter (HOSPITAL_BASED_OUTPATIENT_CLINIC_OR_DEPARTMENT_OTHER): Payer: Self-pay | Admitting: Emergency Medicine

## 2022-09-29 ENCOUNTER — Other Ambulatory Visit (HOSPITAL_BASED_OUTPATIENT_CLINIC_OR_DEPARTMENT_OTHER): Payer: Self-pay

## 2022-09-29 ENCOUNTER — Emergency Department (HOSPITAL_BASED_OUTPATIENT_CLINIC_OR_DEPARTMENT_OTHER)
Admission: EM | Admit: 2022-09-29 | Discharge: 2022-09-29 | Disposition: A | Discharge status: Home / Self Care | Payer: Medicare Other | Source: Home / Self Care | Attending: Emergency Medicine | Admitting: Emergency Medicine

## 2022-09-29 ENCOUNTER — Other Ambulatory Visit: Payer: Self-pay

## 2022-09-29 ENCOUNTER — Emergency Department (HOSPITAL_BASED_OUTPATIENT_CLINIC_OR_DEPARTMENT_OTHER): Payer: Medicare Other

## 2022-09-29 DIAGNOSIS — Z794 Long term (current) use of insulin: Secondary | ICD-10-CM | POA: Insufficient documentation

## 2022-09-29 DIAGNOSIS — E039 Hypothyroidism, unspecified: Secondary | ICD-10-CM | POA: Insufficient documentation

## 2022-09-29 DIAGNOSIS — Z79899 Other long term (current) drug therapy: Secondary | ICD-10-CM | POA: Diagnosis not present

## 2022-09-29 DIAGNOSIS — Z7984 Long term (current) use of oral hypoglycemic drugs: Secondary | ICD-10-CM | POA: Insufficient documentation

## 2022-09-29 DIAGNOSIS — R1032 Left lower quadrant pain: Secondary | ICD-10-CM | POA: Diagnosis not present

## 2022-09-29 DIAGNOSIS — M545 Low back pain, unspecified: Secondary | ICD-10-CM | POA: Diagnosis not present

## 2022-09-29 DIAGNOSIS — E119 Type 2 diabetes mellitus without complications: Secondary | ICD-10-CM | POA: Insufficient documentation

## 2022-09-29 DIAGNOSIS — I10 Essential (primary) hypertension: Secondary | ICD-10-CM | POA: Insufficient documentation

## 2022-09-29 LAB — COMPREHENSIVE METABOLIC PANEL
ALT: 18 U/L (ref 0–44)
AST: 23 U/L (ref 15–41)
Albumin: 4.3 g/dL (ref 3.5–5.0)
Alkaline Phosphatase: 88 U/L (ref 38–126)
Anion gap: 10 (ref 5–15)
BUN: 20 mg/dL (ref 8–23)
CO2: 24 mmol/L (ref 22–32)
Calcium: 10.4 mg/dL — ABNORMAL HIGH (ref 8.9–10.3)
Chloride: 105 mmol/L (ref 98–111)
Creatinine, Ser: 0.86 mg/dL (ref 0.44–1.00)
GFR, Estimated: >60 mL/min (ref >60)
Glucose, Bld: 126 mg/dL — ABNORMAL HIGH (ref 70–99)
Potassium: 4.5 mmol/L (ref 3.5–5.1)
Sodium: 139 mmol/L (ref 135–145)
Total Bilirubin: 0.3 mg/dL (ref 0.3–1.2)
Total Protein: 7.1 g/dL (ref 6.5–8.1)

## 2022-09-29 LAB — URINALYSIS, ROUTINE W REFLEX MICROSCOPIC
Bilirubin Urine: NEGATIVE
Glucose, UA: NEGATIVE mg/dL
Hgb urine dipstick: NEGATIVE
Ketones, ur: NEGATIVE mg/dL
Leukocytes,Ua: NEGATIVE
Nitrite: NEGATIVE
Protein, ur: NEGATIVE mg/dL
Specific Gravity, Urine: 1.01 (ref 1.005–1.030)
pH: 6 (ref 5.0–8.0)

## 2022-09-29 LAB — CBC
HCT: 44 % (ref 36.0–46.0)
Hemoglobin: 14.2 g/dL (ref 12.0–15.0)
MCH: 29.3 pg (ref 26.0–34.0)
MCHC: 32.3 g/dL (ref 30.0–36.0)
MCV: 90.7 fL (ref 80.0–100.0)
Platelets: 198 10*3/uL (ref 150–400)
RBC: 4.85 MIL/uL (ref 3.87–5.11)
RDW: 13.8 % (ref 11.5–15.5)
WBC: 8.3 10*3/uL (ref 4.0–10.5)
nRBC: 0 % (ref 0.0–0.2)

## 2022-09-29 LAB — LIPASE, BLOOD: Lipase: 34 U/L (ref 11–51)

## 2022-09-29 MED ORDER — FENTANYL CITRATE PF 50 MCG/ML IJ SOSY
25.0000 ug | PREFILLED_SYRINGE | Freq: Once | INTRAMUSCULAR | Status: AC
  Administered 2022-09-29: 25 ug via INTRAVENOUS
  Filled 2022-09-29: qty 1

## 2022-09-29 MED ORDER — IOHEXOL 300 MG/ML  SOLN
100.0000 mL | Freq: Once | INTRAMUSCULAR | Status: AC | PRN
  Administered 2022-09-29: 85 mL via INTRAVENOUS

## 2022-09-29 NOTE — Discharge Instructions (Signed)
Labs and CT scan of abdomen and pelvis without any acute findings.  Which is reassuring.  Follow-up with your primary care provider.  Continue to use the Desitin Lotrimin combination for the rash.

## 2022-09-29 NOTE — ED Triage Notes (Addendum)
Pt c/o lower abd pain, lower back pain, and rash "all over body" x 1 week, pt reports she has been seen by PCP for same with no relief, pt reports hx of diverticulitis and colitis, feels similar, LBM yesterday

## 2022-09-29 NOTE — ED Notes (Signed)
Pt discharged home and given discharge paperwork. Opportunities given for questions. Pt verbalizes understanding. PIV removed x1. Stone,Heather R , RN 

## 2022-09-29 NOTE — ED Provider Notes (Addendum)
[place] EMERGENCY DEPARTMENT AT [facility] Provider Note   CSN: [id number] Arrival date & time: 09/29/22  4098     History  Chief Complaint  Patient presents with   Abdominal Pain    [name] [name] is a 77 y.o. female.  Patient here with complaint of left lower quadrant abdominal pain for a week.  Does cause some low back pain as well.  Patient apparently has a history of diverticulitis and colitis.  Patient has been seen here before for same complaint patient seen June 20 had negative CT at that time.  Also seen May 8 had negative CT at that time.  Not associated with nausea vomiting or diarrhea.  Patient states that the discomfort feels like her diverticulitis or her colitis.  Past medical history significant for ischemic colitis in 2012 history of alcohol abuse.  Fibromyalgia chronic back pain irritable bowel syndrome restless leg syndrome diverticulosis bipolar type I disorder hyperlipidemia hypertension hypothyroidism type 2 diabetes lupus gastroparesis chronic fatigue syndrome patient's had her appendix removed surgically patient had cardiac catheterization in 2019.  Abdominal hysterectomy in the past.  In addition is complaining of a rash underneath her breast and skin folds on the abdomen.  Seen by urgent care they started her on an antifungal cream and Desitin.  Patient says she has been applying that but is not completely resolved.  But it is improving.       Home Medications Prior to Admission medications   Medication Sig Start Date End Date Taking? Authorizing Provider  Acetaminophen 500 MG capsule Take 1,000 mg by mouth every 6 (six) hours as needed for fever or pain.    [provider]  albuterol (VENTOLIN HFA) 108 (90 Base) MCG/ACT inhaler Inhale 1-2 puffs into the lungs every 6 (six) hours as needed for wheezing or shortness of breath.    [provider]  ALPRAZolam Prudy Feeler) 0.5 MG tablet Take 1 tablet (0.5 mg total) by mouth 4 (four)  times daily as needed for anxiety. 07/22/22   Corie Chiquito, PMHNP  atorvastatin (LIPITOR) 40 MG tablet Take 1 tablet (40 mg total) by mouth See admin instructions. Take 40 mg by mouth at 5 PM 06/12/22   Rodolph Bong, MD  b complex vitamins capsule Take 1 capsule by mouth daily.    [provider]  bismuth subsalicylate (PEPTO BISMOL) 262 MG/15ML suspension Take 30 mLs by mouth every 6 (six) hours as needed.    [provider]  calcium carbonate (TUMS EX) 750 MG chewable tablet Chew 1 tablet by mouth as needed.    [provider]  cholecalciferol (VITAMIN D3) 25 MCG (1000 UNIT) tablet Take 1,000-2,000 Units by mouth See admin instructions. Take 1,000 units by mouth in the morning and 2,000 units at 5 PM    [provider]  CONTOUR NEXT TEST test strip USE TO TEST BLOOD SUGAR TWICE DAILY 08/01/21   Reather Littler, MD  cyclobenzaprine (FLEXERIL) 5 MG tablet Take 5 mg by mouth 3 (three) times daily as needed for muscle spasms.    [provider]  diclofenac (VOLTAREN) 75 MG EC tablet Take 75 mg by mouth 2 (two) times daily. 05/11/22   [provider]  diclofenac Sodium (VOLTAREN) 1 % GEL Apply 4 g topically 4 (four) times daily. Patient taking differently: Apply 4 g topically 4 (four) times daily as needed (for pain- affected areas). 05/21/22   Palumbo, April, MD  dicyclomine (BENTYL) 20 MG tablet Take 1 tablet (20 mg  total) by mouth 3 (three) times daily as needed for spasms. 06/17/21   Burnadette Pop, MD  divalproex (DEPAKOTE) 250 MG DR tablet Take 2 tablets (500 mg total) by mouth at bedtime. 09/08/22   Corie Chiquito, PMHNP  ezetimibe (ZETIA) 10 MG tablet Take 1 tablet (10 mg total) by mouth every evening. 06/12/22   Rodolph Bong, MD  fluticasone (FLONASE) 50 MCG/ACT nasal spray Place 1 spray into both nostrils 2 (two) times daily as needed for allergies or rhinitis.    [provider]  FUROSEMIDE PO Take by mouth as needed.    [provider]  gabapentin (NEURONTIN) 600 MG tablet Take 600 mg by mouth 2 (two) times daily. Take 600 mg by mouth in the morning, at 5 PM, and at bedtime 05/12/19   [provider]  insulin glargine (LANTUS SOLOSTAR) 100 UNIT/ML Solostar Pen ADMINISTER 76 UNITS UNDER THE SKIN AT BEDTIME 07/31/22   Reather Littler, MD  insulin lispro (HUMALOG KWIKPEN) 100 UNIT/ML KwikPen Inject 14-18 Units into the skin 2 (two) times daily before a meal. 07/06/22   Reather Littler, MD  ipratropium (ATROVENT) 0.06 % nasal spray Place 2 sprays into both nostrils 4 (four) times daily as needed for rhinitis. 06/01/22   [provider]  Ipratropium-Albuterol (COMBIVENT RESPIMAT) 20-100 MCG/ACT AERS respimat Inhale 1 puff into the lungs in the morning, at noon, and at bedtime for 7 days, THEN 1 puff every 6 (six) hours as needed for wheezing. 06/05/22 07/12/22  Rodolph Bong, MD  levothyroxine (SYNTHROID) 75 MCG tablet Take 1 tablet (75 mcg total) by mouth daily before breakfast. 03/26/22   Reather Littler, MD  lidocaine (XYLOCAINE) 2 % solution Use as directed 15 mLs in the mouth or throat as directed. 06/01/22   [provider]  liothyronine (CYTOMEL) 5 MCG tablet TAKE 1 TABLET BY MOUTH DAILY WITH LEVOTHYROXINE  TAKE BEFORE BREAKFAST Patient taking differently: Take 5 mcg by mouth daily before breakfast. 03/26/22   Reather Littler, MD  loperamide (IMODIUM) 2 MG capsule Take 2 capsules (4 mg total) by mouth 3 (three) times daily as needed for diarrhea or loose stools. 11/12/21   Danford, Earl Lites, MD  loratadine (CLARITIN) 10 MG tablet Take 10 mg by mouth daily as needed for allergies.    [provider]  losartan (COZAAR) 25 MG tablet Take 25 mg by mouth See admin instructions. Take 25 mg by mouth at 5 PM 08/31/19   [provider]  metFORMIN (GLUCOPHAGE-XR) 500 MG 24 hr tablet Take 1 tablet (500 mg total) by mouth 2 (two) times daily with a meal. 09/08/22   Worthy Rancher, MD  Microlet Lancets  MISC TEST DAILY AS DIRECTED 08/01/21   Reather Littler, MD  nitroGLYCERIN (NITROSTAT) 0.4 MG SL tablet Place 1 tablet (0.4 mg total) under the tongue every 5 (five) minutes as needed for chest pain. 09/14/17   Robbie Lis M, PA-C  ondansetron (ZOFRAN) 4 MG tablet Take 1 tablet (4 mg total) by mouth every 6 (six) hours. Patient taking differently: Take 4 mg by mouth every 6 (six) hours as needed for nausea or vomiting. 09/20/21   Curatolo, Adam, DO  pantoprazole (PROTONIX) 40 MG tablet Take 1 tablet (40 mg total) by mouth daily at 6 (six) AM. 06/06/22   Rodolph Bong, MD  POTASSIUM PO Take by mouth as needed.    [provider]  QUEtiapine (SEROQUEL) 25 MG tablet Take 2 tablets at bedtime for 2 weeks, then decrease  to 1-2 tablets at bedtime 09/08/22   Corie Chiquito, PMHNP  rOPINIRole (REQUIP) 0.5 MG tablet Takes 4 tabs at 5 pm and 3 tabs at bedtime and 1-2 tabs prn 07/23/22   Corie Chiquito, PMHNP  Wheat Dextrin (BENEFIBER PO) Take 4-5 g by mouth daily as needed (for constipation- mix as directed).    [provider]      Allergies    Codeine, Procaine hcl, Epinephrine, Ozempic (0.25 or 0.5 mg-dose) [semaglutide(0.25 or 0.5mg -dos)], Aspirin, Augmentin [amoxicillin-pot clavulanate], Benadryl [diphenhydramine], Diflucan [fluconazole], Dilaudid [hydromorphone hcl], Hydromorphone, Morphine, Other, Prednisone, Tramadol hcl, Macrobid [nitrofurantoin], and Sulfa antibiotics    Review of Systems   Review of Systems  Constitutional:  Negative for chills and fever.  HENT:  Negative for ear pain and sore throat.   Eyes:  Negative for pain and visual disturbance.  Respiratory:  Negative for cough and shortness of breath.   Cardiovascular:  Negative for chest pain and palpitations.  Gastrointestinal:  Positive for abdominal pain. Negative for vomiting.  Genitourinary:  Negative for dysuria and hematuria.  Musculoskeletal:  Positive for back pain. Negative for arthralgias.  Skin:   Positive for rash. Negative for color change.  Neurological:  Negative for seizures and syncope.  All other systems reviewed and are negative.   Physical Exam Updated Vital Signs BP (!) 162/66 (BP Location: Left Arm)   Pulse 84   Temp 97.8 F (36.6 C) (Oral)   Resp 19   Ht 1.524 m (5')   Wt 80.7 kg   SpO2 96%   BMI 34.76 kg/m  Physical Exam Vitals and nursing note reviewed.  Constitutional:      General: She is not in acute distress.    Appearance: Normal appearance. She is well-developed. She is obese. She is not ill-appearing.  HENT:     Head: Normocephalic and atraumatic.     Mouth/Throat:     Mouth: Mucous membranes are moist.  Eyes:     Extraocular Movements: Extraocular movements intact.     Conjunctiva/sclera: Conjunctivae normal.     Pupils: Pupils are equal, round, and reactive to light.  Cardiovascular:     Rate and Rhythm: Normal rate and regular rhythm.     Heart sounds: No murmur heard. Pulmonary:     Effort: Pulmonary effort is normal. No respiratory distress.     Breath sounds: Normal breath sounds.  Abdominal:     Palpations: Abdomen is soft.     Tenderness: There is no abdominal tenderness. There is no guarding.  Musculoskeletal:        General: No swelling.     Cervical back: Normal range of motion and neck supple.  Skin:    General: Skin is warm and dry.     Capillary Refill: Capillary refill takes less than 2 seconds.     Findings: Rash present.     Comments: Rash under the skin folds of both breasts not erythematous at this point in time appears to be a healing fungal type infection same on the skin fold of the abdomen.  Neurological:     General: No focal deficit present.     Mental Status: She is alert and oriented to person, place, and time.  Psychiatric:        Mood and Affect: Mood normal.     ED Results / Procedures / Treatments   Labs (all labs ordered are listed, but only abnormal results are displayed) Labs Reviewed   COMPREHENSIVE METABOLIC PANEL - Abnormal; Notable for the  following components:      Result Value   Glucose, Bld 126 (*)    Calcium 10.4 (*)    All other components within normal limits  LIPASE, BLOOD  CBC  URINALYSIS, ROUTINE W REFLEX MICROSCOPIC    EKG None  Radiology No results found.  Procedures Procedures    Medications Ordered in ED Medications  iohexol (OMNIPAQUE) 300 MG/ML solution 100 mL (85 mLs Intravenous Contrast Given 09/29/22 0814)    ED Course/ Medical Decision Making/ A&P                             Medical Decision Making Amount and/or Complexity of Data Reviewed Labs: ordered. Radiology: ordered.  Risk Prescription drug management.   Symptoms location always concerning for diverticulitis.  Will get CT scan of the abdomen.  Urinalysis pending lipase normal complete metabolic panel normal including LFTs.  CBC no leukocytosis hemoglobin 14.2 platelets are 198.  Will see with CT scan of the abdomen and pelvis show.  CT scan abdomen pelvis without acute findings.  Labs without any abnormalities.  Complete metabolic panel normal.  LFTs normal.  Lipase normal at 34.  Patient's rash underneath her breast and at the abdominal fold consistent with a yeast or fungal type rash.  Seems to be improving.   Final Clinical Impression(s) / ED Diagnoses Final diagnoses:  Left lower quadrant abdominal pain    Rx / DC Orders ED Discharge Orders     None         Vanetta Mulders, MD 09/29/22 Darrel Hoover    Vanetta Mulders, MD 09/29/22 1028

## 2022-10-01 ENCOUNTER — Encounter (INDEPENDENT_AMBULATORY_CARE_PROVIDER_SITE_OTHER): Payer: Self-pay | Admitting: Internal Medicine

## 2022-10-01 ENCOUNTER — Ambulatory Visit (INDEPENDENT_AMBULATORY_CARE_PROVIDER_SITE_OTHER): Payer: Medicare Other | Admitting: Internal Medicine

## 2022-10-01 VITALS — BP 115/76 | HR 82 | Temp 98.3°F | Ht 60.0 in | Wt 178.0 lb

## 2022-10-01 DIAGNOSIS — I10 Essential (primary) hypertension: Secondary | ICD-10-CM | POA: Diagnosis not present

## 2022-10-01 DIAGNOSIS — E669 Obesity, unspecified: Secondary | ICD-10-CM

## 2022-10-01 DIAGNOSIS — G4733 Obstructive sleep apnea (adult) (pediatric): Secondary | ICD-10-CM | POA: Diagnosis not present

## 2022-10-01 DIAGNOSIS — E1165 Type 2 diabetes mellitus with hyperglycemia: Secondary | ICD-10-CM

## 2022-10-01 DIAGNOSIS — Z794 Long term (current) use of insulin: Secondary | ICD-10-CM

## 2022-10-01 DIAGNOSIS — R948 Abnormal results of function studies of other organs and systems: Secondary | ICD-10-CM

## 2022-10-01 DIAGNOSIS — Z6834 Body mass index (BMI) 34.0-34.9, adult: Secondary | ICD-10-CM | POA: Diagnosis not present

## 2022-10-01 NOTE — Assessment & Plan Note (Addendum)
Most recent hemoglobin A1c was 6.7.  Patient also reports an improvement in blood sugars.  She was recently seen by endocrinology.  She is currently on Lantus and was tolerating metformin once a day but is now experiencing upset stomach.  I recommend that she stop metformin for 1 week and then reintroduce medications to determine cause-and-effect relationship.  She had intolerance to medication in the past so I suspect this is likely the cause.  If her symptoms do not improve that she may warrant further investigation.  It seems like she had side effects to incretin therapy in the past, but may have not been following a nutritional strategy.  She may also benefit from an SGLT2 medication as next option if there are no contraindications.

## 2022-10-01 NOTE — Assessment & Plan Note (Signed)
Patient reports having a sleep study with an AHI of 29 and oxygen desaturations.  She is now on PAP therapy and has been feeling well.  She has been getting better sleep and has noticed an improvement in energy levels..  Losing 15% of body weight may improve condition.  Psychiatry has also been titrating her medications down.

## 2022-10-01 NOTE — Progress Notes (Signed)
Office: [phone]  /  Fax: [phone]  WEIGHT SUMMARY AND BIOMETRICS  Vitals Temp: 98.3 F (36.8 C) BP: 115/76 Pulse Rate: 82 SpO2: 94 %   Anthropometric Measurements Height: 5' (1.524 m) Weight: 178 lb (80.7 kg) BMI (Calculated): 34.76 Weight at Last Visit: 178 lb Weight Lost Since Last Visit: 0 lb Weight Gained Since Last Visit: 0 lb Starting Weight: 191 lb Total Weight Loss (lbs): 13 lb (5.897 kg) Peak Weight: 194 lb   Body Composition  Body Fat %: 44.2 % Fat Mass (lbs): 78.8 lbs Muscle Mass (lbs): 94.4 lbs Total Body Water (lbs): 65.6 lbs Visceral Fat Rating : 14    No data recorded Today's Visit #: 9  Starting Date: 05/06/22   HPI  Chief Complaint: OBESITY  [name] [name] is here to discuss her progress with her obesity treatment plan. She is on the the Category 2 Plan and states she is following her eating plan approximately 30 % of the time. She states she is exercising 10 minutes 3 times per week.  Interval History:  Since last office visit she has maintained. She reports  has declined she relies on her husband for meal preparation and he has been making more highly palatable foods. Patient also reports experiencing upset stomach not sure why.  She has been on metformin now for about 2 months.  She denies any nausea or vomiting.  Orixegenic Control: Denies problems with appetite and hunger signals.  Denies problems with satiety and satiation.  Denies problems with eating patterns and portion control.  Reports abnormal cravings. Reports feeling deprived or restricted.   Barriers identified: lack of partner or family support, medical comorbidities, difficulty implementing reduced calorie nutrition plan, presence of obesogenic drugs, slow metabolism for age, and menopause and inadequate protein intake .   Pharmacotherapy for weight loss: She is currently taking Metformin (off label use for incretin effect and / or insulin resistance and / or diabetes  prevention) with adequate clinical response  and experiencing the following side effects: Upset stomach may be related to medication..    ASSESSMENT AND PLAN  TREATMENT PLAN FOR OBESITY:  Recommended Dietary Goals  [name] [name] is currently in the action stage of change. As such, her goal is to continue weight management plan. She has agreed to: continue current plan and she is requesting an itemized daily plan as this will help her husband prepare her a variety of meals this will be created for her at the next office visit as this has to be tailored to caloric range.  She will be having indirect calorimetry as her initial testing suggest a slower than predicted metabolism for age.  Behavioral Intervention  We discussed the following Behavioral Modification Strategies today: increasing lean protein intake, decreasing simple carbohydrates , increasing vegetables, increasing lower glycemic fruits, increasing water intake, and planning for success.  Additional resources provided today:  Handout on how to boost her metabolism  Recommended Physical Activity Goals  [name] [name] has been advised to work up to 150 minutes of moderate intensity aerobic activity a week and strengthening exercises 2-3 times per week for cardiovascular health, weight loss maintenance and preservation of muscle mass.   She has agreed to :  Think about ways to increase daily physical activity and overcoming barriers to exercise  Pharmacotherapy We discussed various medication options to help [name] [name] with her weight loss efforts and we both agreed to :  I have asked her to hold metformin due to GI upset to determine cause-and-effect relationship.  ASSOCIATED  CONDITIONS ADDRESSED TODAY  Abnormal metabolism Assessment & Plan: Patient has a slower than predicted metabolism. IC 547 vs. calculated 1384. This may contribute to weight gain, chronic fatigue and difficulty losing weight.  I do not think this value is representative  of her actual REE.  There has also been significant changes in regards to her medications and now being treated for sleep apnea.  I recommend repeating her REE at the next visit.  She will come in 30 minutes early to her appointment in a fasting state. We reviewed measures to improve metabolism including not skipping meals, progressive strengthening exercises, increasing protein intake at every meal and maintaining adequate hydration and sleep.     Type 2 diabetes mellitus with hyperglycemia, with long-term current use of insulin ([name]) Assessment & Plan: Most recent hemoglobin A1c was 6.7.  Patient also reports an improvement in blood sugars.  She was recently seen by endocrinology.  She is currently on Lantus and was tolerating metformin once a day but is now experiencing upset stomach.  I recommend that she stop metformin for 1 week and then reintroduce medications to determine cause-and-effect relationship.  She had intolerance to medication in the past so I suspect this is likely the cause.  If her symptoms do not improve that she may warrant further investigation.  It seems like she had side effects to incretin therapy in the past, but may have not been following a nutritional strategy.  She may also benefit from an SGLT2 medication as next option if there are no contraindications.   OSA (obstructive sleep apnea) Assessment & Plan: Patient reports having a sleep study with an AHI of 29 and oxygen desaturations.  She is now on PAP therapy and has been feeling well.  She has been getting better sleep and has noticed an improvement in energy levels..  Losing 15% of body weight may improve condition.  Psychiatry has also been titrating her medications down.   Obesity with current BMI of 34.9 Assessment & Plan: [name] [name] has lost 13 pounds, things have slowed down due to returning to previous eating patterns.  Her husband does most of the meal preparation and has been making sweets and baked goods  which are very tempting for her..  There is also been a decrease in protein intake and changes in physical activity.  Regardless she has been able to maintain.  When we met her her resting energy expenditure was markedly low, this was in the presence of some obesogenic medications which she is no longer on.  She also had undiagnosed and untreated sleep apnea and is now being treated.  I would like to repeat her IC to have a better idea of what her metabolic rate is doing.  She is requesting an itemized nutrition plan which we could create for her at the next visit using AI once we have a caloric target based on REE.  I have sent electronic correspondence to our physician assistant Kathlynn Grate for care coordination   Essential hypertension Assessment & Plan: Blood pressure at goal for age and comorbid conditions,  on losartan 25 mg a day without adverse effects.  Reviewed most recent renal parameters which showed a GFR of 62 and normal electrolytes.  She will continue on current regimen.  She will monitor for symptoms of orthostasis.  She was also advised to maintain adequate hydration      PHYSICAL EXAM:  Blood pressure 115/76, pulse 82, temperature 98.3 F (36.8 C), height 5' (1.524 m), weight  178 lb (80.7 kg), SpO2 94%. Body mass index is 34.76 kg/m.  General: She is overweight, cooperative, alert, well developed, and in no acute distress. PSYCH: Has normal mood, affect and thought process.   HEENT: EOMI, sclerae are anicteric. Lungs: Normal breathing effort, no conversational dyspnea. Extremities: No edema.  Neurologic: No gross sensory or motor deficits. No tremors or fasciculations noted.    DIAGNOSTIC DATA REVIEWED:  BMET    Component Value Date/Time   NA 139 09/29/2022 0642   NA 140 01/27/2021 1526   K 4.5 09/29/2022 0642   CL 105 09/29/2022 0642   CO2 24 09/29/2022 0642   GLUCOSE 126 (H) 09/29/2022 0642   BUN 20 09/29/2022 0642   BUN 8 01/27/2021 1526   CREATININE  0.86 09/29/2022 0642   CREATININE 0.94 07/20/2022 1640   CALCIUM 10.4 (H) 09/29/2022 0642   GFRNONAA >60 09/29/2022 0642   GFRNONAA 57 (L) 11/17/2017 0000   GFRAA >60 12/10/2019 0657   GFRAA 67 11/17/2017 0000   Lab Results  Component Value Date   HGBA1C 6.7 (H) 08/26/2022   HGBA1C 5.5 12/25/2010   No results found for: "INSULIN" Lab Results  Component Value Date   TSH 0.89 08/26/2022   CBC    Component Value Date/Time   WBC 8.3 09/29/2022 0642   RBC 4.85 09/29/2022 0642   HGB 14.2 09/29/2022 0642   HGB 14.0 01/27/2021 1526   HCT 44.0 09/29/2022 0642   HCT 41.5 01/27/2021 1526   PLT 198 09/29/2022 0642   PLT 255 01/27/2021 1526   MCV 90.7 09/29/2022 0642   MCV 87 01/27/2021 1526   MCH 29.3 09/29/2022 0642   MCHC 32.3 09/29/2022 0642   RDW 13.8 09/29/2022 0642   RDW 13.6 01/27/2021 1526   Iron Studies    Component Value Date/Time   FERRITIN 43 07/06/2015 0459   Lipid Panel     Component Value Date/Time   CHOL 175 08/26/2022 1010   CHOL 159 05/06/2022 0948   TRIG 289.0 (H) 08/26/2022 1010   HDL 47.50 08/26/2022 1010   HDL 58 05/06/2022 0948   CHOLHDL 4 08/26/2022 1010   VLDL 57.8 (H) 08/26/2022 1010   LDLCALC 72 05/06/2022 0948   LDLDIRECT 95.0 08/26/2022 1010   Hepatic Function Panel     Component Value Date/Time   PROT 7.1 09/29/2022 0642   PROT 6.4 01/27/2021 1526   ALBUMIN 4.3 09/29/2022 0642   ALBUMIN 4.4 01/27/2021 1526   AST 23 09/29/2022 0642   ALT 18 09/29/2022 0642   ALKPHOS 88 09/29/2022 0642   BILITOT 0.3 09/29/2022 0642   BILITOT 0.4 01/27/2021 1526   BILIDIR 0.2 06/14/2021 0406   IBILI 0.5 06/14/2021 0406      Component Value Date/Time   TSH 0.89 08/26/2022 1010   Nutritional Lab Results  Component Value Date   VD25OH 37.1 05/06/2022   VD25OH 21 (L) 07/04/2012     Return in about 1 week (around 10/08/2022) for with Shawn Rayburn, patient needs to come for repeat IC test before visit. with me in 3-4 weeks.Marland Kitchen She was informed of  the importance of frequent follow up visits to maximize her success with intensive lifestyle modifications for her multiple health conditions.   ATTESTASTION STATEMENTS:  Reviewed by clinician on day of visit: allergies, medications, problem list, medical history, surgical history, family history, social history, and previous encounter notes.    I have spent 40 minutes in the care of the patient today including: preparing to see patient (  e.g. review and interpretation of tests, old notes ), obtaining and/or reviewing separately obtained history, counseling and educating the patient, documenting clinical information in the electronic or other health care record, independently interpreting results and communicating results to the patient,family, or caregiver, and care coordination  Worthy Rancher, MD

## 2022-10-01 NOTE — Assessment & Plan Note (Addendum)
Ronna has lost 13 pounds, things have slowed down due to returning to previous eating patterns.  Her husband does most of the meal preparation and has been making sweets and baked goods which are very tempting for her..  There is also been a decrease in protein intake and changes in physical activity.  Regardless she has been able to maintain.  When we met her her resting energy expenditure was markedly low, this was in the presence of some obesogenic medications which she is no longer on.  She also had undiagnosed and untreated sleep apnea and is now being treated.  I would like to repeat her IC to have a better idea of what her metabolic rate is doing.  She is requesting an itemized nutrition plan which we could create for her at the next visit using AI once we have a caloric target based on REE.  I have sent electronic correspondence to our physician assistant Kathlynn Grate for care coordination

## 2022-10-01 NOTE — Assessment & Plan Note (Signed)
Patient has a slower than predicted metabolism. IC 547 vs. calculated 1384. This may contribute to weight gain, chronic fatigue and difficulty losing weight.  I do not think this value is representative of her actual REE.  There has also been significant changes in regards to her medications and now being treated for sleep apnea.  I recommend repeating her REE at the next visit.  She will come in 30 minutes early to her appointment in a fasting state. We reviewed measures to improve metabolism including not skipping meals, progressive strengthening exercises, increasing protein intake at every meal and maintaining adequate hydration and sleep.

## 2022-10-01 NOTE — Assessment & Plan Note (Signed)
Blood pressure at goal for age and comorbid conditions,  on losartan 25 mg a day without adverse effects.  Reviewed most recent renal parameters which showed a GFR of 62 and normal electrolytes.  She will continue on current regimen.  She will monitor for symptoms of orthostasis.  She was also advised to maintain adequate hydration

## 2022-10-05 ENCOUNTER — Other Ambulatory Visit: Payer: Self-pay

## 2022-10-05 DIAGNOSIS — E119 Type 2 diabetes mellitus without complications: Secondary | ICD-10-CM

## 2022-10-05 MED ORDER — LANTUS SOLOSTAR 100 UNIT/ML ~~LOC~~ SOPN
PEN_INJECTOR | SUBCUTANEOUS | 2 refills | Status: DC
Start: 2022-10-05 — End: 2022-12-17

## 2022-10-06 ENCOUNTER — Other Ambulatory Visit (INDEPENDENT_AMBULATORY_CARE_PROVIDER_SITE_OTHER): Payer: Self-pay | Admitting: Internal Medicine

## 2022-10-06 DIAGNOSIS — E1165 Type 2 diabetes mellitus with hyperglycemia: Secondary | ICD-10-CM

## 2022-10-07 ENCOUNTER — Ambulatory Visit (INDEPENDENT_AMBULATORY_CARE_PROVIDER_SITE_OTHER): Payer: Medicare Other | Admitting: Psychiatry

## 2022-10-07 ENCOUNTER — Encounter: Payer: Self-pay | Admitting: Psychiatry

## 2022-10-07 DIAGNOSIS — F319 Bipolar disorder, unspecified: Secondary | ICD-10-CM

## 2022-10-07 DIAGNOSIS — F5101 Primary insomnia: Secondary | ICD-10-CM | POA: Diagnosis not present

## 2022-10-07 DIAGNOSIS — F411 Generalized anxiety disorder: Secondary | ICD-10-CM

## 2022-10-07 DIAGNOSIS — G2581 Restless legs syndrome: Secondary | ICD-10-CM | POA: Diagnosis not present

## 2022-10-07 MED ORDER — ROPINIROLE HCL 0.5 MG PO TABS
ORAL_TABLET | ORAL | 0 refills | Status: DC
Start: 2022-10-07 — End: 2022-11-16

## 2022-10-07 MED ORDER — QUETIAPINE FUMARATE 25 MG PO TABS
ORAL_TABLET | ORAL | Status: DC
Start: 2022-10-07 — End: 2022-11-16
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
## 2022-10-12 ENCOUNTER — Ambulatory Visit (INDEPENDENT_AMBULATORY_CARE_PROVIDER_SITE_OTHER): Payer: Medicare Other | Admitting: Physician Assistant

## 2022-10-13 DIAGNOSIS — I10 Essential (primary) hypertension: Secondary | ICD-10-CM | POA: Diagnosis not present

## 2022-10-13 DIAGNOSIS — G4700 Insomnia, unspecified: Secondary | ICD-10-CM | POA: Diagnosis not present

## 2022-10-13 DIAGNOSIS — F319 Bipolar disorder, unspecified: Secondary | ICD-10-CM | POA: Diagnosis not present

## 2022-10-13 DIAGNOSIS — G4733 Obstructive sleep apnea (adult) (pediatric): Secondary | ICD-10-CM | POA: Diagnosis not present

## 2022-10-15 DIAGNOSIS — R0902 Hypoxemia: Secondary | ICD-10-CM | POA: Diagnosis not present

## 2022-10-22 ENCOUNTER — Emergency Department (HOSPITAL_BASED_OUTPATIENT_CLINIC_OR_DEPARTMENT_OTHER)
Admission: EM | Admit: 2022-10-22 | Discharge: 2022-10-22 | Disposition: A | Discharge status: Home / Self Care | Payer: Medicare Other | Attending: Emergency Medicine | Admitting: Emergency Medicine

## 2022-10-22 ENCOUNTER — Other Ambulatory Visit: Payer: Self-pay

## 2022-10-22 ENCOUNTER — Encounter (HOSPITAL_BASED_OUTPATIENT_CLINIC_OR_DEPARTMENT_OTHER): Payer: Self-pay

## 2022-10-22 DIAGNOSIS — M5442 Lumbago with sciatica, left side: Secondary | ICD-10-CM | POA: Diagnosis not present

## 2022-10-22 DIAGNOSIS — M5136 Other intervertebral disc degeneration, lumbar region: Secondary | ICD-10-CM | POA: Insufficient documentation

## 2022-10-22 MED ORDER — IBUPROFEN 400 MG PO TABS
400.0000 mg | ORAL_TABLET | Freq: Once | ORAL | Status: AC
  Administered 2022-10-22: 400 mg via ORAL
  Filled 2022-10-22: qty 1

## 2022-10-22 MED ORDER — ACETAMINOPHEN 500 MG PO TABS
1000.0000 mg | ORAL_TABLET | Freq: Once | ORAL | Status: AC
  Administered 2022-10-22: 1000 mg via ORAL
  Filled 2022-10-22: qty 2

## 2022-10-22 MED ORDER — DEXAMETHASONE SODIUM PHOSPHATE 10 MG/ML IJ SOLN
10.0000 mg | Freq: Once | INTRAMUSCULAR | Status: AC
  Administered 2022-10-22: 10 mg via INTRAMUSCULAR
  Filled 2022-10-22: qty 1

## 2022-10-22 MED ORDER — METHOCARBAMOL 750 MG PO TABS: 750.0000 mg | ORAL_TABLET | Freq: Three times a day (TID) | ORAL | 0 refills | Status: AC | PRN

## 2022-10-22 NOTE — Discharge Instructions (Addendum)
It was our pleasure to provide your ER care today - we hope that you feel better.  Avoid heavy lifting > 20 lbs, or bending at waist, for the next week.   Take acetaminophen or ibuprofen as need for pain. You may also take robaxin as need for pain/spasm.  Follow up with your spine specialist as arranged.   Return to ER if worse, new symptoms, fevers, severe/intractable pain, numbness/weakness, or other concern.

## 2022-10-22 NOTE — ED Triage Notes (Signed)
Pt c/o sciatic pain x "at least a week." Advises she called neurologist, "but he's booked out, epidural scheduled Monday." Pt advises pain radiates down L leg "into that foot." States she took RX gabapentin, "it never seems to cover pain."

## 2022-10-22 NOTE — ED Provider Notes (Signed)
[place] EMERGENCY DEPARTMENT AT [facility] Provider Note   CSN: [id number] Arrival date & time: 10/22/22  1116     History  Chief Complaint  Patient presents with   Sciatica    [name] [name] is a 77 y.o. female.  Pt c/o 'sciatic pain' in the past week. Denies recent trauma, fall, injury or strain. Hx ddd. States pain in lower back and radiates down left leg. No saddle area or leg numbness. No weakness of legs. No problems w normal bowel and bladder control. No fever or chills. Has not taken anything for pain today. Has appt with her spine specialist this coming Monday.   The history is provided by the patient and medical records.       Home Medications Prior to Admission medications   Medication Sig Start Date End Date Taking? Authorizing Provider  methocarbamol (ROBAXIN) 750 MG tablet Take 1 tablet (750 mg total) by mouth 3 (three) times daily as needed (muscle spasm/pain). 10/22/22  Yes Cathren Laine, MD  albuterol (VENTOLIN HFA) 108 (90 Base) MCG/ACT inhaler Inhale 1-2 puffs into the lungs every 6 (six) hours as needed for wheezing or shortness of breath.    [provider]  ALPRAZolam Prudy Feeler) 0.5 MG tablet Take 1 tablet (0.5 mg total) by mouth 4 (four) times daily as needed for anxiety. 07/22/22   Corie Chiquito, PMHNP  atorvastatin (LIPITOR) 40 MG tablet Take 1 tablet (40 mg total) by mouth See admin instructions. Take 40 mg by mouth at 5 PM 06/12/22   Rodolph Bong, MD  b complex vitamins capsule Take 1 capsule by mouth daily.    [provider]  bismuth subsalicylate (PEPTO BISMOL) 262 MG/15ML suspension Take 30 mLs by mouth every 6 (six) hours as needed.    [provider]  calcium carbonate (TUMS EX) 750 MG chewable tablet Chew 1 tablet by mouth as needed.    [provider]  cholecalciferol (VITAMIN D3) 25 MCG (1000 UNIT) tablet Take 1,000-2,000 Units by mouth See admin instructions. Take 1,000 units by mouth in the  morning and 2,000 units at 5 PM    [provider]  CONTOUR NEXT TEST test strip USE TO TEST BLOOD SUGAR TWICE DAILY 08/01/21   Reather Littler, MD  diclofenac (VOLTAREN) 75 MG EC tablet Take 75 mg by mouth as needed. 05/11/22   [provider]  diclofenac Sodium (VOLTAREN) 1 % GEL Apply 4 g topically 4 (four) times daily. Patient not taking: Reported on 10/07/2022 05/21/22   Palumbo, April, MD  dicyclomine (BENTYL) 20 MG tablet Take 1 tablet (20 mg total) by mouth 3 (three) times daily as needed for spasms. Patient not taking: Reported on 10/07/2022 06/17/21   Burnadette Pop, MD  divalproex (DEPAKOTE) 250 MG DR tablet Take 2 tablets (500 mg total) by mouth at bedtime. 09/08/22   Corie Chiquito, PMHNP  ezetimibe (ZETIA) 10 MG tablet Take 1 tablet (10 mg total) by mouth every evening. 06/12/22   Rodolph Bong, MD  fluticasone (FLONASE) 50 MCG/ACT nasal spray Place 1 spray into both nostrils 2 (two) times daily as needed for allergies or rhinitis.    [provider]  FUROSEMIDE PO Take by mouth as needed.    [provider]  gabapentin (NEURONTIN) 600 MG tablet Take 600 mg by mouth 2 (two) times daily. Take 600 mg by mouth in the morning, at 5 PM, and at bedtime 05/12/19   [provider]  ibuprofen (ADVIL) 200 MG tablet  Take 200 mg by mouth every 6 (six) hours as needed.    [provider]  insulin glargine (LANTUS SOLOSTAR) 100 UNIT/ML Solostar Pen ADMINISTER 76 UNITS UNDER THE SKIN AT BEDTIME 10/05/22   Reather Littler, MD  insulin lispro (HUMALOG KWIKPEN) 100 UNIT/ML KwikPen Inject 14-18 Units into the skin 2 (two) times daily before a meal. Patient not taking: Reported on 10/07/2022 07/06/22   Reather Littler, MD  Ipratropium-Albuterol (COMBIVENT RESPIMAT) 20-100 MCG/ACT AERS respimat Inhale 1 puff into the lungs in the morning, at noon, and at bedtime for 7 days, THEN 1 puff every 6 (six) hours as needed for wheezing. 06/05/22 07/12/22  Rodolph Bong, MD   levothyroxine (SYNTHROID) 75 MCG tablet Take 1 tablet (75 mcg total) by mouth daily before breakfast. 03/26/22   Reather Littler, MD  lidocaine (XYLOCAINE) 2 % solution Use as directed 15 mLs in the mouth or throat as directed. 06/01/22   [provider]  liothyronine (CYTOMEL) 5 MCG tablet TAKE 1 TABLET BY MOUTH DAILY WITH LEVOTHYROXINE  TAKE BEFORE BREAKFAST Patient taking differently: Take 5 mcg by mouth daily before breakfast. 03/26/22   Reather Littler, MD  loperamide (IMODIUM) 2 MG capsule Take 2 capsules (4 mg total) by mouth 3 (three) times daily as needed for diarrhea or loose stools. 11/12/21   Danford, Earl Lites, MD  loratadine (CLARITIN) 10 MG tablet Take 10 mg by mouth daily as needed for allergies.    [provider]  losartan (COZAAR) 25 MG tablet Take 25 mg by mouth See admin instructions. Take 25 mg by mouth at 5 PM 08/31/19   [provider]  metFORMIN (GLUCOPHAGE-XR) 500 MG 24 hr tablet Take 1 tablet (500 mg total) by mouth 2 (two) times daily with a meal. Patient not taking: Reported on 10/07/2022 09/08/22   Worthy Rancher, MD  Microlet Lancets MISC TEST DAILY AS DIRECTED 08/01/21   Reather Littler, MD  nitroGLYCERIN (NITROSTAT) 0.4 MG SL tablet Place 1 tablet (0.4 mg total) under the tongue every 5 (five) minutes as needed for chest pain. 09/14/17   Robbie Lis M, PA-C  ondansetron (ZOFRAN) 4 MG tablet Take 1 tablet (4 mg total) by mouth every 6 (six) hours. Patient taking differently: Take 4 mg by mouth every 6 (six) hours as needed for nausea or vomiting. 09/20/21   Curatolo, Adam, DO  pantoprazole (PROTONIX) 40 MG tablet Take 1 tablet (40 mg total) by mouth daily at 6 (six) AM. 06/06/22   Rodolph Bong, MD  POTASSIUM PO Take by mouth as needed.    [provider]  QUEtiapine (SEROQUEL) 25 MG tablet Take 1/2-1 tablets at bedtime 10/07/22   Corie Chiquito, PMHNP  rOPINIRole (REQUIP) 0.5 MG tablet Takes 4 tabs at 5 pm and 3 tabs at bedtime and 1-2  tabs prn 10/07/22   Corie Chiquito, PMHNP  Wheat Dextrin (BENEFIBER PO) Take 4-5 g by mouth daily as needed (for constipation- mix as directed).    [provider]      Allergies    Codeine, Procaine hcl, Epinephrine, Ozempic (0.25 or 0.5 mg-dose) [semaglutide(0.25 or 0.5mg -dos)], Aspirin, Augmentin [amoxicillin-pot clavulanate], Benadryl [diphenhydramine], Diflucan [fluconazole], Dilaudid [hydromorphone hcl], Hydromorphone, Morphine, Other, Prednisone, Tramadol hcl, Macrobid [nitrofurantoin], and Sulfa antibiotics    Review of Systems   Review of Systems  Constitutional:  Negative for chills and fever.  Respiratory:  Negative for shortness of breath.   Cardiovascular:  Negative for chest pain.  Gastrointestinal:  Negative for abdominal pain and vomiting.  Genitourinary:  Negative for dysuria.  Musculoskeletal:  Positive for back pain.  Skin:  Negative for rash.  Neurological:  Negative for weakness and numbness.    Physical Exam Updated Vital Signs BP 128/63 (BP Location: Right Arm)   Pulse 81   Temp 98 F (36.7 C) (Oral)   Resp 16   SpO2 97%  Physical Exam Vitals and nursing note reviewed.  Constitutional:      Appearance: Normal appearance. She is well-developed.  HENT:     Head: Atraumatic.     Nose: Nose normal.     Mouth/Throat:     Mouth: Mucous membranes are moist.  Eyes:     General: No scleral icterus.    Conjunctiva/sclera: Conjunctivae normal.  Neck:     Trachea: No tracheal deviation.  Cardiovascular:     Rate and Rhythm: Normal rate.     Pulses: Normal pulses.  Pulmonary:     Effort: Pulmonary effort is normal. No respiratory distress.  Abdominal:     General: There is no distension.     Palpations: Abdomen is soft. There is no mass.     Tenderness: There is no abdominal tenderness.  Genitourinary:    Comments: No cva tenderness.  Musculoskeletal:        General: No swelling.     Cervical back: Neck supple. No muscular tenderness.      Comments: T/L/S spine non tender, aligned. Lower lumbar muscular tenderness. No sts or skin changes noted to area of pain.   Skin:    General: Skin is warm and dry.     Findings: No rash.  Neurological:     Mental Status: She is alert.     Comments: Alert, speech normal. Motor/sens grossly intact bil lower extremities. Stre 5/5. Distal pulses palp.   Psychiatric:        Mood and Affect: Mood normal.     ED Results / Procedures / Treatments   Labs (all labs ordered are listed, but only abnormal results are displayed) Labs Reviewed - No data to display  EKG None  Radiology No results found.  Procedures Procedures    Medications Ordered in ED Medications  acetaminophen (TYLENOL) tablet 1,000 mg (1,000 mg Oral Given 10/22/22 1222)  ibuprofen (ADVIL) tablet 400 mg (400 mg Oral Given 10/22/22 1222)  dexamethasone (DECADRON) injection 10 mg (10 mg Intramuscular Given 10/22/22 1222)    ED Course/ Medical Decision Making/ A&P                                 Medical Decision Making Problems Addressed: Acute midline low back pain with left-sided sciatica: acute illness or injury with systemic symptoms DDD (degenerative disc disease), lumbar: chronic illness or injury with exacerbation, progression, or side effects of treatment that poses a threat to life or bodily functions  Amount and/or Complexity of Data Reviewed External Data Reviewed: notes.  Risk OTC drugs. Prescription drug management.   Pt indicates can get steroid shot, but does not want rx prednisone. Decadron IM.  Acetaminophen po.   Reviewed nursing notes and prior charts for additional history.   Rx for home.   Close f/u with her spine specialist as already arranged.   Return precautions provided.           Final Clinical Impression(s) / ED Diagnoses Final diagnoses:  Acute midline low back pain with left-sided sciatica  DDD (degenerative disc disease), lumbar  Rx / DC Orders ED Discharge  Orders          Ordered    methocarbamol (ROBAXIN) 750 MG tablet  3 times daily PRN        10/22/22 1244              Cathren Laine, MD 10/22/22 1245

## 2022-10-23 ENCOUNTER — Other Ambulatory Visit: Payer: Self-pay

## 2022-10-23 ENCOUNTER — Telehealth: Payer: Self-pay | Admitting: Endocrinology

## 2022-10-23 DIAGNOSIS — Z794 Long term (current) use of insulin: Secondary | ICD-10-CM

## 2022-10-23 DIAGNOSIS — M5442 Lumbago with sciatica, left side: Secondary | ICD-10-CM | POA: Diagnosis not present

## 2022-10-23 MED ORDER — FREESTYLE LIBRE 2 SENSOR MISC: 1.0000 [IU] | 0 refills | Status: AC

## 2022-10-23 NOTE — Telephone Encounter (Signed)
[name] [name] states that she is doing better today , she states her Blood Sugar is 229 and she  she states she has taken 12 units of Humalog twice today and will take 36 tonight if advisable by Dr Lucianne Muss , please advise ?

## 2022-10-23 NOTE — Telephone Encounter (Signed)
Late entry: Patient called on the evening of 8/15 with blood sugar about 500, had received a steroid injection from the ER earlier in the day.  Advised her to take 30 units of Humalog and another 20 if blood sugars stay over 400 after 6 hours.  Counseled to take 90 units of Lantus instead of 76 for the evening.  To increase fluids and if blood pressure is under over 90 to go to the ER for hydration.  Need to know if she is doing better

## 2022-10-23 NOTE — Telephone Encounter (Signed)
She understands the recommendation

## 2022-10-26 DIAGNOSIS — M5416 Radiculopathy, lumbar region: Secondary | ICD-10-CM | POA: Diagnosis not present

## 2022-10-26 NOTE — Telephone Encounter (Signed)
Should patient increase the Humalog?

## 2022-10-26 NOTE — Telephone Encounter (Signed)
Patient is calling today saying that she received another epidural shot today and she is afraid that her blood sugar level is going to go up again.  Patient would like to know what she needs to do in case it does.

## 2022-10-27 ENCOUNTER — Ambulatory Visit (INDEPENDENT_AMBULATORY_CARE_PROVIDER_SITE_OTHER): Payer: Medicare Other | Admitting: Physician Assistant

## 2022-11-03 ENCOUNTER — Other Ambulatory Visit: Payer: Self-pay

## 2022-11-03 ENCOUNTER — Ambulatory Visit (INDEPENDENT_AMBULATORY_CARE_PROVIDER_SITE_OTHER): Payer: Medicare Other | Admitting: Internal Medicine

## 2022-11-05 ENCOUNTER — Ambulatory Visit (INDEPENDENT_AMBULATORY_CARE_PROVIDER_SITE_OTHER): Payer: Medicare Other | Admitting: Internal Medicine

## 2022-11-05 DIAGNOSIS — G4733 Obstructive sleep apnea (adult) (pediatric): Secondary | ICD-10-CM | POA: Diagnosis not present

## 2022-11-06 ENCOUNTER — Encounter (HOSPITAL_BASED_OUTPATIENT_CLINIC_OR_DEPARTMENT_OTHER): Payer: Self-pay | Admitting: Emergency Medicine

## 2022-11-06 ENCOUNTER — Other Ambulatory Visit: Payer: Self-pay

## 2022-11-06 ENCOUNTER — Emergency Department (HOSPITAL_BASED_OUTPATIENT_CLINIC_OR_DEPARTMENT_OTHER)
Admission: EM | Admit: 2022-11-06 | Discharge: 2022-11-06 | Disposition: A | Discharge status: Home / Self Care | Payer: Medicare Other | Attending: Emergency Medicine | Admitting: Emergency Medicine

## 2022-11-06 DIAGNOSIS — Z794 Long term (current) use of insulin: Secondary | ICD-10-CM | POA: Insufficient documentation

## 2022-11-06 DIAGNOSIS — M5442 Lumbago with sciatica, left side: Secondary | ICD-10-CM | POA: Diagnosis not present

## 2022-11-06 DIAGNOSIS — M545 Low back pain, unspecified: Secondary | ICD-10-CM | POA: Diagnosis present

## 2022-11-06 MED ORDER — KETOROLAC TROMETHAMINE 15 MG/ML IJ SOLN
15.0000 mg | Freq: Once | INTRAMUSCULAR | Status: AC
  Administered 2022-11-06: 15 mg via INTRAMUSCULAR
  Filled 2022-11-06: qty 1

## 2022-11-06 MED ORDER — ACETAMINOPHEN 500 MG PO TABS
1000.0000 mg | ORAL_TABLET | Freq: Once | ORAL | Status: AC
  Administered 2022-11-06: 1000 mg via ORAL
  Filled 2022-11-06: qty 2

## 2022-11-06 MED ORDER — OXYCODONE HCL 5 MG PO TABS
5.0000 mg | ORAL_TABLET | Freq: Once | ORAL | Status: AC
  Administered 2022-11-06: 5 mg via ORAL
  Filled 2022-11-06: qty 1

## 2022-11-06 NOTE — ED Triage Notes (Signed)
Pt arrives to ED with c/o on-going chronic lower back pain. Had epidural 11 days ago w/o relief.

## 2022-11-06 NOTE — ED Provider Notes (Signed)
[place] EMERGENCY DEPARTMENT AT [facility] Provider Note   CSN: [id number] Arrival date & time: 11/06/22  1051     History  Chief Complaint  Patient presents with   Back Pain    [name] [name] is a 77 y.o. female.  77 year old female with a chief complaint of low back pain.  This been an ongoing issue for her.  She had a recent epidural she does not feel like it is working.  She does not endorse any recent trauma.  No fevers.  No loss of bowel or bladder.  Feels like her pain is in her lower back and radiates around both sides of her abdomen.  Feels like it goes down the left leg primarily.   Back Pain      Home Medications Prior to Admission medications   Medication Sig Start Date End Date Taking? Authorizing Provider  albuterol (VENTOLIN HFA) 108 (90 Base) MCG/ACT inhaler Inhale 1-2 puffs into the lungs every 6 (six) hours as needed for wheezing or shortness of breath.    [provider]  ALPRAZolam Prudy Feeler) 0.5 MG tablet Take 1 tablet (0.5 mg total) by mouth 4 (four) times daily as needed for anxiety. 07/22/22   Corie Chiquito, PMHNP  atorvastatin (LIPITOR) 40 MG tablet Take 1 tablet (40 mg total) by mouth See admin instructions. Take 40 mg by mouth at 5 PM 06/12/22   Rodolph Bong, MD  b complex vitamins capsule Take 1 capsule by mouth daily.    [provider]  bismuth subsalicylate (PEPTO BISMOL) 262 MG/15ML suspension Take 30 mLs by mouth every 6 (six) hours as needed.    [provider]  calcium carbonate (TUMS EX) 750 MG chewable tablet Chew 1 tablet by mouth as needed.    [provider]  cholecalciferol (VITAMIN D3) 25 MCG (1000 UNIT) tablet Take 1,000-2,000 Units by mouth See admin instructions. Take 1,000 units by mouth in the morning and 2,000 units at 5 PM    [provider]  Continuous Glucose Sensor (FREESTYLE LIBRE 2 SENSOR) MISC 1 Units by Does not apply route every 14 (fourteen) days. 10/23/22 11/22/22   Reather Littler, MD  CONTOUR NEXT TEST test strip USE TO TEST BLOOD SUGAR TWICE DAILY 08/01/21   Reather Littler, MD  diclofenac (VOLTAREN) 75 MG EC tablet Take 75 mg by mouth as needed. 05/11/22   [provider]  diclofenac Sodium (VOLTAREN) 1 % GEL Apply 4 g topically 4 (four) times daily. Patient not taking: Reported on 10/07/2022 05/21/22   Palumbo, April, MD  dicyclomine (BENTYL) 20 MG tablet Take 1 tablet (20 mg total) by mouth 3 (three) times daily as needed for spasms. Patient not taking: Reported on 10/07/2022 06/17/21   Burnadette Pop, MD  divalproex (DEPAKOTE) 250 MG DR tablet Take 2 tablets (500 mg total) by mouth at bedtime. 09/08/22   Corie Chiquito, PMHNP  ezetimibe (ZETIA) 10 MG tablet Take 1 tablet (10 mg total) by mouth every evening. 06/12/22   Rodolph Bong, MD  fluticasone (FLONASE) 50 MCG/ACT nasal spray Place 1 spray into both nostrils 2 (two) times daily as needed for allergies or rhinitis.    [provider]  FUROSEMIDE PO Take by mouth as needed.    [provider]  gabapentin (NEURONTIN) 600 MG tablet Take 600 mg by mouth 2 (two) times daily. Take 600 mg by mouth in the morning, at 5 PM, and at bedtime 05/12/19   [provider]  ibuprofen (ADVIL)  200 MG tablet Take 200 mg by mouth every 6 (six) hours as needed.    [provider]  insulin glargine (LANTUS SOLOSTAR) 100 UNIT/ML Solostar Pen ADMINISTER 76 UNITS UNDER THE SKIN AT BEDTIME 10/05/22   Reather Littler, MD  insulin lispro (HUMALOG KWIKPEN) 100 UNIT/ML KwikPen Inject 14-18 Units into the skin 2 (two) times daily before a meal. Patient not taking: Reported on 10/07/2022 07/06/22   Reather Littler, MD  Ipratropium-Albuterol (COMBIVENT RESPIMAT) 20-100 MCG/ACT AERS respimat Inhale 1 puff into the lungs in the morning, at noon, and at bedtime for 7 days, THEN 1 puff every 6 (six) hours as needed for wheezing. 06/05/22 07/12/22  Rodolph Bong, MD  levothyroxine (SYNTHROID) 75 MCG tablet Take 1  tablet (75 mcg total) by mouth daily before breakfast. 03/26/22   Reather Littler, MD  lidocaine (XYLOCAINE) 2 % solution Use as directed 15 mLs in the mouth or throat as directed. 06/01/22   [provider]  liothyronine (CYTOMEL) 5 MCG tablet TAKE 1 TABLET BY MOUTH DAILY WITH LEVOTHYROXINE  TAKE BEFORE BREAKFAST Patient taking differently: Take 5 mcg by mouth daily before breakfast. 03/26/22   Reather Littler, MD  loperamide (IMODIUM) 2 MG capsule Take 2 capsules (4 mg total) by mouth 3 (three) times daily as needed for diarrhea or loose stools. 11/12/21   Danford, Earl Lites, MD  loratadine (CLARITIN) 10 MG tablet Take 10 mg by mouth daily as needed for allergies.    [provider]  losartan (COZAAR) 25 MG tablet Take 25 mg by mouth See admin instructions. Take 25 mg by mouth at 5 PM 08/31/19   [provider]  metFORMIN (GLUCOPHAGE-XR) 500 MG 24 hr tablet Take 1 tablet (500 mg total) by mouth 2 (two) times daily with a meal. Patient not taking: Reported on 10/07/2022 09/08/22   Worthy Rancher, MD  methocarbamol (ROBAXIN) 750 MG tablet Take 1 tablet (750 mg total) by mouth 3 (three) times daily as needed (muscle spasm/pain). 10/22/22   Cathren Laine, MD  Microlet Lancets MISC TEST DAILY AS DIRECTED 08/01/21   Reather Littler, MD  nitroGLYCERIN (NITROSTAT) 0.4 MG SL tablet Place 1 tablet (0.4 mg total) under the tongue every 5 (five) minutes as needed for chest pain. 09/14/17   Robbie Lis M, PA-C  ondansetron (ZOFRAN) 4 MG tablet Take 1 tablet (4 mg total) by mouth every 6 (six) hours. Patient taking differently: Take 4 mg by mouth every 6 (six) hours as needed for nausea or vomiting. 09/20/21   Curatolo, Adam, DO  pantoprazole (PROTONIX) 40 MG tablet Take 1 tablet (40 mg total) by mouth daily at 6 (six) AM. 06/06/22   Rodolph Bong, MD  POTASSIUM PO Take by mouth as needed.    [provider]  QUEtiapine (SEROQUEL) 25 MG tablet Take 1/2-1 tablets at bedtime 10/07/22    Corie Chiquito, PMHNP  rOPINIRole (REQUIP) 0.5 MG tablet Takes 4 tabs at 5 pm and 3 tabs at bedtime and 1-2 tabs prn 10/07/22   Corie Chiquito, PMHNP  Wheat Dextrin (BENEFIBER PO) Take 4-5 g by mouth daily as needed (for constipation- mix as directed).    [provider]      Allergies    Codeine, Procaine hcl, Epinephrine, Ozempic (0.25 or 0.5 mg-dose) [semaglutide(0.25 or 0.5mg -dos)], Aspirin, Augmentin [amoxicillin-pot clavulanate], Benadryl [diphenhydramine], Diflucan [fluconazole], Dilaudid [hydromorphone hcl], Hydromorphone, Morphine, Other, Prednisone, Tramadol hcl, Macrobid [nitrofurantoin], and Sulfa antibiotics    Review of Systems   Review of Systems  Musculoskeletal:  Positive for back pain.    Physical Exam Updated Vital Signs BP (!) 147/69   Pulse 82   Temp 97.8 F (36.6 C) (Temporal)   Resp 14   Ht 5' (1.524 m)   Wt 81.6 kg   SpO2 93%   BMI 35.15 kg/m  Physical Exam Vitals and nursing note reviewed.  Constitutional:      General: She is not in acute distress.    Appearance: She is well-developed. She is not diaphoretic.  HENT:     Head: Normocephalic and atraumatic.  Eyes:     Pupils: Pupils are equal, round, and reactive to light.  Cardiovascular:     Rate and Rhythm: Normal rate and regular rhythm.     Heart sounds: No murmur heard.    No friction rub. No gallop.  Pulmonary:     Effort: Pulmonary effort is normal.     Breath sounds: No wheezing or rales.  Abdominal:     General: There is no distension.     Palpations: Abdomen is soft.     Tenderness: There is no abdominal tenderness.  Musculoskeletal:        General: No tenderness.     Cervical back: Normal range of motion and neck supple.     Comments: Pulse motor and sensation are intact to bilateral lower extremities.  Reflexes are 2+ and equal.  Positive straight leg raise test on the left.  Mild diffuse back pain without obvious focality.  No obvious midline tenderness step-offs or  deformities.  Skin:    General: Skin is warm and dry.  Neurological:     Mental Status: She is alert and oriented to person, place, and time.  Psychiatric:        Behavior: Behavior normal.     ED Results / Procedures / Treatments   Labs (all labs ordered are listed, but only abnormal results are displayed) Labs Reviewed - No data to display  EKG None  Radiology No results found.  Procedures Procedures    Medications Ordered in ED Medications  acetaminophen (TYLENOL) tablet 1,000 mg (1,000 mg Oral Given 11/06/22 1128)  ketorolac (TORADOL) 15 MG/ML injection 15 mg (15 mg Intramuscular Given 11/06/22 1129)  oxyCODONE (Oxy IR/ROXICODONE) immediate release tablet 5 mg (5 mg Oral Given 11/06/22 1128)    ED Course/ Medical Decision Making/ A&P                                 Medical Decision Making Risk OTC drugs. Prescription drug management.   77 yo F with a chief complaints of low back pain that radiates around to the abdomen.  On my record review this is been going on for quite some time.  She has had multiple ED visits for the same spanning years.  She tells me she had a recent epidural.  Other than her age she has no obvious red flags.  Will give her a dose of medicine here.  Encouraged her to follow-up with her spinal surgeon and family doctor.  11:46 AM:  I have discussed the diagnosis/risks/treatment options with the patient.  Evaluation and diagnostic testing in the emergency department does not suggest an emergent condition requiring admission or immediate intervention beyond what has been performed at this time.  They will follow up with PCP. We also discussed returning to the ED immediately if new or worsening sx occur. We discussed the sx which are most concerning (  e.g., sudden worsening pain, fever, inability to tolerate by mouth, cauda equina s/x) that necessitate immediate return. Medications administered to the patient during their visit and any new prescriptions  provided to the patient are listed below.  Medications given during this visit Medications  acetaminophen (TYLENOL) tablet 1,000 mg (1,000 mg Oral Given 11/06/22 1128)  ketorolac (TORADOL) 15 MG/ML injection 15 mg (15 mg Intramuscular Given 11/06/22 1129)  oxyCODONE (Oxy IR/ROXICODONE) immediate release tablet 5 mg (5 mg Oral Given 11/06/22 1128)     The patient appears reasonably screen and/or stabilized for discharge and I doubt any other medical condition or other [facility] requiring further screening, evaluation, or treatment in the ED at this time prior to discharge.          Final Clinical Impression(s) / ED Diagnoses Final diagnoses:  Acute bilateral low back pain with left-sided sciatica    Rx / DC Orders ED Discharge Orders     None         Melene Plan, DO 11/06/22 1146

## 2022-11-06 NOTE — Discharge Instructions (Signed)
Your back pain is most likely due to a muscular strain.  There is been a lot of research on back pain, unfortunately the only thing that seems to really help is Tylenol and ibuprofen.  Relative rest is also important to not lift greater than 10 pounds bending or twisting at the waist.  Please follow-up with your family physician.  The other thing that really seems to benefit patients is physical therapy which your doctor may send you for.  Please return to the emergency department for new numbness or weakness to your arms or legs. Difficulty with urinating or urinating or pooping on yourself.  Also if you cannot feel toilet paper when you wipe or get a fever.   Please follow your back doctors instructions to manage your discomfort.

## 2022-11-06 NOTE — ED Notes (Signed)
Pt d/c home per MD order. Discharge summary reviewed, pt verbalizes understanding. Ambulatory off unit, no s/s of acute distress noted. Reports Benedetto Goad is discharge ride home.

## 2022-11-07 DIAGNOSIS — M5442 Lumbago with sciatica, left side: Secondary | ICD-10-CM | POA: Diagnosis not present

## 2022-11-11 DIAGNOSIS — M5442 Lumbago with sciatica, left side: Secondary | ICD-10-CM | POA: Diagnosis not present

## 2022-11-12 ENCOUNTER — Encounter (INDEPENDENT_AMBULATORY_CARE_PROVIDER_SITE_OTHER): Payer: Self-pay | Admitting: Physician Assistant

## 2022-11-12 ENCOUNTER — Ambulatory Visit (INDEPENDENT_AMBULATORY_CARE_PROVIDER_SITE_OTHER): Payer: Medicare Other | Admitting: Physician Assistant

## 2022-11-12 VITALS — BP 117/74 | HR 99 | Temp 98.0°F | Ht 60.0 in | Wt 179.0 lb

## 2022-11-12 DIAGNOSIS — Z6841 Body Mass Index (BMI) 40.0 and over, adult: Secondary | ICD-10-CM | POA: Insufficient documentation

## 2022-11-12 DIAGNOSIS — Z7984 Long term (current) use of oral hypoglycemic drugs: Secondary | ICD-10-CM

## 2022-11-12 DIAGNOSIS — M5430 Sciatica, unspecified side: Secondary | ICD-10-CM

## 2022-11-12 DIAGNOSIS — G4733 Obstructive sleep apnea (adult) (pediatric): Secondary | ICD-10-CM

## 2022-11-12 DIAGNOSIS — E669 Obesity, unspecified: Secondary | ICD-10-CM | POA: Diagnosis not present

## 2022-11-12 DIAGNOSIS — Z6835 Body mass index (BMI) 35.0-35.9, adult: Secondary | ICD-10-CM

## 2022-11-12 DIAGNOSIS — E1165 Type 2 diabetes mellitus with hyperglycemia: Secondary | ICD-10-CM

## 2022-11-12 DIAGNOSIS — Z794 Long term (current) use of insulin: Secondary | ICD-10-CM | POA: Diagnosis not present

## 2022-11-12 DIAGNOSIS — R948 Abnormal results of function studies of other organs and systems: Secondary | ICD-10-CM

## 2022-11-12 NOTE — Progress Notes (Signed)
.smr  Office: [phone]  /  Fax: [phone]  WEIGHT SUMMARY AND BIOMETRICS  Vitals Temp: 98 F (36.7 C) BP: 117/74 Pulse Rate: 99 SpO2: 90 %   Anthropometric Measurements Height: 5' (1.524 m) Weight: 179 lb (81.2 kg) BMI (Calculated): 34.96 Weight at Last Visit: 178 lb Weight Lost Since Last Visit: 0 Weight Gained Since Last Visit: 1 lb Starting Weight: 191 lb Total Weight Loss (lbs): 12 lb (5.443 kg) Peak Weight: 194 lb   Body Composition  Body Fat %: 46 % Fat Mass (lbs): 82.6 lbs Muscle Mass (lbs): 92 lbs Total Body Water (lbs): 67 lbs Visceral Fat Rating : 15   Other Clinical Data Fasting: no Labs: no Today's Visit #: 9 Starting Date: 05/06/22     HPI  Chief Complaint: OBESITY  [name] [name] is here to discuss her progress with her obesity treatment plan. She is on the the Category 2 Plan and states she is following her eating plan approximately 25 % of the time. She states she is exercising 0 minutes 0 times per week. Discussed the use of AI scribe software for clinical note transcription with the patient, who gave verbal consent to proceed.  History of Present Illness  /   Interval History:  Since last office visit she up 1 lbs.    The patient, with a history of obesity, type 2 diabetes, hypertension, obstructive sleep apnea, and low metabolism, presents for a follow-up of her obesity treatment plan. She reports recent exacerbation of back pain due to sciatica, which has significantly limited her mobility and daily activities. Despite gabapentin therapy, she describes the pain as severe and unrelenting. She is awaiting an MRI for further evaluation.  The patient also reports recent diagnosis of sleep apnea, for which she has been using a CPAP machine for about a month. However, due to low oxygen levels during sleep, supplemental oxygen has been added to her CPAP therapy.  She acknowledges struggling with her diet due to her back pain and has noticed an  increase in her blood sugar levels. She reports feeling hungry often, especially at night, and admits to comfort eating. She expresses difficulty adhering to her diet plan and has noticed an increase in her blood sugar levels.   Pharmacotherapy: metformin 500 mg twice daily for Type 2 diabetes. No medications specifically for weight loss.   TREATMENT PLAN FOR OBESITY: Obesity Stable weight despite back pain and reduced mobility. Noted low metabolism rate, possibly due to sleep apnea, pain issues, and lack of regular exercise. -Discussed focusing on high protein diet, adequate hydration, and movement as tolerated. -Consider Pillars drinkable yogurt for added protein and probiotics. Her husband does shopping/cooking and provided the patient with Category 2 nutrition plan and grocery list as well as 100 calorie snack handout today.   Recommended Dietary Goals  [name] [name] is currently in the action stage of change. As such, her goal is to continue weight management plan. She has agreed to the Category 2 Plan.  Behavioral Intervention  We discussed the following Behavioral Modification Strategies today: increasing lean protein intake, decreasing simple carbohydrates , increasing vegetables, increasing lower glycemic fruits, increasing water intake, work on meal planning and preparation, emotional eating strategies and understanding the difference between hunger signals and cravings, work on managing stress, creating time for self-care and relaxation measures, continue to practice mindfulness when eating, planning for success, and better snacking choices.  Additional resources provided today: Category 2 nutrition plan and grocery list      100 calorie  protein snack hand out  Recommended Physical Activity Goals  [name] [name] has been advised to work up to 150 minutes of moderate intensity aerobic activity a week and strengthening exercises 2-3 times per week for cardiovascular health, weight loss  maintenance and preservation of muscle mass.   She has agreed to Continue current level of physical activity  and Think about ways to increase daily physical activity and overcoming barriers to exercise   Pharmacotherapy We discussed various medication options to help [name] [name] with her weight loss efforts and we both agreed to continue medications for management of Type 2 diabetes and continue to work on nutritional and behavioral strategies to promote weight loss.      Return in about 4 weeks (around 12/10/2022).Marland Kitchen She was informed of the importance of frequent follow up visits to maximize her success with intensive lifestyle modifications for her multiple health conditions.  PHYSICAL EXAM:  Blood pressure 117/74, pulse 99, temperature 98 F (36.7 C), height 5' (1.524 m), weight 179 lb (81.2 kg), SpO2 90%. Body mass index is 34.96 kg/m.  General: She is overweight, cooperative, alert, well developed, and in no acute distress. Walks with antalgic appearing gait with cane today.  PSYCH: Has normal mood, affect and thought process.   Lungs: Normal breathing effort, no conversational dyspnea. Cardiovascular: HR 90's BP 117/74  DIAGNOSTIC DATA REVIEWED:  BMET    Component Value Date/Time   NA 139 09/29/2022 0642   NA 140 01/27/2021 1526   K 4.5 09/29/2022 0642   CL 105 09/29/2022 0642   CO2 24 09/29/2022 0642   GLUCOSE 126 (H) 09/29/2022 0642   BUN 20 09/29/2022 0642   BUN 8 01/27/2021 1526   CREATININE 0.86 09/29/2022 0642   CREATININE 0.94 07/20/2022 1640   CALCIUM 10.4 (H) 09/29/2022 0642   GFRNONAA >60 09/29/2022 0642   GFRNONAA 57 (L) 11/17/2017 0000   GFRAA >60 12/10/2019 0657   GFRAA 67 11/17/2017 0000   Lab Results  Component Value Date   HGBA1C 6.7 (H) 08/26/2022   HGBA1C 5.5 12/25/2010   No results found for: "INSULIN" Lab Results  Component Value Date   TSH 0.89 08/26/2022   CBC    Component Value Date/Time   WBC 8.3 09/29/2022 0642   RBC 4.85  09/29/2022 0642   HGB 14.2 09/29/2022 0642   HGB 14.0 01/27/2021 1526   HCT 44.0 09/29/2022 0642   HCT 41.5 01/27/2021 1526   PLT 198 09/29/2022 0642   PLT 255 01/27/2021 1526   MCV 90.7 09/29/2022 0642   MCV 87 01/27/2021 1526   MCH 29.3 09/29/2022 0642   MCHC 32.3 09/29/2022 0642   RDW 13.8 09/29/2022 0642   RDW 13.6 01/27/2021 1526   Iron Studies    Component Value Date/Time   FERRITIN 43 07/06/2015 0459   Lipid Panel     Component Value Date/Time   CHOL 175 08/26/2022 1010   CHOL 159 05/06/2022 0948   TRIG 289.0 (H) 08/26/2022 1010   HDL 47.50 08/26/2022 1010   HDL 58 05/06/2022 0948   CHOLHDL 4 08/26/2022 1010   VLDL 57.8 (H) 08/26/2022 1010   LDLCALC 72 05/06/2022 0948   LDLDIRECT 95.0 08/26/2022 1010   Hepatic Function Panel     Component Value Date/Time   PROT 7.1 09/29/2022 0642   PROT 6.4 01/27/2021 1526   ALBUMIN 4.3 09/29/2022 0642   ALBUMIN 4.4 01/27/2021 1526   AST 23 09/29/2022 0642   ALT 18 09/29/2022 0642   ALKPHOS 88 09/29/2022 2130  BILITOT 0.3 09/29/2022 0642   BILITOT 0.4 01/27/2021 1526   BILIDIR 0.2 06/14/2021 0406   IBILI 0.5 06/14/2021 0406      Component Value Date/Time   TSH 0.89 08/26/2022 1010   Nutritional Lab Results  Component Value Date   VD25OH 37.1 05/06/2022   VD25OH 21 (L) 07/04/2012    ASSOCIATED CONDITIONS ADDRESSED TODAY  ASSESSMENT AND PLAN  Problem List Items Addressed This Visit     Sciatica   Type 2 diabetes mellitus with hyperglycemia ([name]) - Primary   OSA (obstructive sleep apnea)   Abnormal metabolism   Generalized obesity START BMI 37.3   Other Visit Diagnoses     BMI 35.0-35.9,adult Current BMI 35.1         Back Pain/sciatica Severe pain, possibly due to sciatica. Epidural injection provided no relief. MRI pending. Currently on Gabapentin and Flexeril for pain management. -Continue Gabapentin and Flexeril as prescribed. -Consider use of walker to alleviate stress on back and improve  mobility.  Type 2 Diabetes On Lantus insulin/ BID meal coverage with Humalog and metformin XR 500 mg daily.  Has CGM and levels have been up recently with increased pain, comfort eating and decreased mobility.  Blood sugar levels have been higher recently, possibly due to inconsistent diet. -Continue monitoring blood sugar levels with sensor. -Adhere to high protein, low sugar diet to help control blood sugar levels.  Obstructive Sleep Apnea Recently diagnosed and started on CPAP therapy. Oxygen levels dropping during sleep, recently added supplemental oxygen to CPAP therapy. -Continue CPAP therapy with supplemental oxygen. -Monitor for improvement in symptoms and potential impact on metabolism rate.  Abnormal metabolism:  Scheduled for follow up early October .  Consider retesting metabolism in early November if patient's condition improves.  ATTESTASTION STATEMENTS:  Reviewed by clinician on day of visit: allergies, medications, problem list, medical history, surgical history, family history, social history, and previous encounter notes.   I have personally spent 48 minutes total time today in preparation, patient care, nutritional counseling and documentation for this visit, including the following: review of clinical lab tests; review of medical tests/procedures/services.      Levetta Bognar, PA-C

## 2022-11-15 ENCOUNTER — Other Ambulatory Visit: Payer: Self-pay

## 2022-11-15 ENCOUNTER — Encounter (HOSPITAL_BASED_OUTPATIENT_CLINIC_OR_DEPARTMENT_OTHER): Payer: Self-pay

## 2022-11-15 ENCOUNTER — Emergency Department (HOSPITAL_BASED_OUTPATIENT_CLINIC_OR_DEPARTMENT_OTHER)
Admission: EM | Admit: 2022-11-15 | Discharge: 2022-11-15 | Disposition: A | Discharge status: Home / Self Care | Payer: Medicare Other | Attending: Emergency Medicine | Admitting: Emergency Medicine

## 2022-11-15 DIAGNOSIS — Z7984 Long term (current) use of oral hypoglycemic drugs: Secondary | ICD-10-CM | POA: Insufficient documentation

## 2022-11-15 DIAGNOSIS — M5442 Lumbago with sciatica, left side: Secondary | ICD-10-CM | POA: Diagnosis not present

## 2022-11-15 DIAGNOSIS — M5417 Radiculopathy, lumbosacral region: Secondary | ICD-10-CM | POA: Diagnosis not present

## 2022-11-15 DIAGNOSIS — Z794 Long term (current) use of insulin: Secondary | ICD-10-CM | POA: Insufficient documentation

## 2022-11-15 DIAGNOSIS — E119 Type 2 diabetes mellitus without complications: Secondary | ICD-10-CM | POA: Insufficient documentation

## 2022-11-15 DIAGNOSIS — M545 Low back pain, unspecified: Secondary | ICD-10-CM | POA: Diagnosis present

## 2022-11-15 LAB — URINALYSIS, ROUTINE W REFLEX MICROSCOPIC
Bilirubin Urine: NEGATIVE
Glucose, UA: NEGATIVE mg/dL
Hgb urine dipstick: NEGATIVE
Ketones, ur: NEGATIVE mg/dL
Leukocytes,Ua: NEGATIVE
Nitrite: NEGATIVE
Protein, ur: NEGATIVE mg/dL
Specific Gravity, Urine: 1.008 (ref 1.005–1.030)
pH: 6 (ref 5.0–8.0)

## 2022-11-15 MED ORDER — LIDOCAINE 5 % EX PTCH
1.0000 | MEDICATED_PATCH | CUTANEOUS | Status: DC
  Administered 2022-11-15: 1 via TRANSDERMAL

## 2022-11-15 MED ORDER — IBUPROFEN 600 MG PO TABS: 600.0000 mg | ORAL_TABLET | Freq: Four times a day (QID) | ORAL | 0 refills | Status: AC

## 2022-11-15 MED ORDER — LIDOCAINE 5 % EX PTCH
1.0000 | MEDICATED_PATCH | CUTANEOUS | Status: DC
  Filled 2022-11-15: qty 1

## 2022-11-15 MED ORDER — KETOROLAC TROMETHAMINE 30 MG/ML IJ SOLN
10.0000 mg | Freq: Once | INTRAMUSCULAR | Status: AC
  Administered 2022-11-15: 9.9 mg via INTRAVENOUS
  Filled 2022-11-15: qty 1

## 2022-11-15 MED ORDER — DIAZEPAM 2 MG PO TABS
2.0000 mg | ORAL_TABLET | Freq: Once | ORAL | Status: AC
  Administered 2022-11-15: 2 mg via ORAL
  Filled 2022-11-15: qty 1

## 2022-11-15 MED ORDER — ACETAMINOPHEN 500 MG PO TABS: 500.0000 mg | ORAL_TABLET | Freq: Four times a day (QID) | ORAL | 0 refills | Status: AC | PRN

## 2022-11-15 MED ORDER — FENTANYL CITRATE PF 50 MCG/ML IJ SOSY
50.0000 ug | PREFILLED_SYRINGE | Freq: Once | INTRAMUSCULAR | Status: AC
  Administered 2022-11-15: 50 ug via INTRAVENOUS
  Filled 2022-11-15: qty 1

## 2022-11-15 MED ORDER — OXYCODONE-ACETAMINOPHEN 5-325 MG PO TABS: 1.0000 | ORAL_TABLET | Freq: Three times a day (TID) | ORAL | 0 refills | Status: DC | PRN

## 2022-11-15 MED ORDER — DEXAMETHASONE SODIUM PHOSPHATE 10 MG/ML IJ SOLN
10.0000 mg | Freq: Once | INTRAMUSCULAR | Status: AC
  Administered 2022-11-15: 10 mg via INTRAVENOUS
  Filled 2022-11-15: qty 1

## 2022-11-15 NOTE — ED Provider Notes (Signed)
[place] EMERGENCY DEPARTMENT AT [facility] Provider Note   CSN: [id number] Arrival date & time: 11/15/22  1052     History  Chief Complaint  Patient presents with   Back Pain    [name] [name] is a 77 y.o. female.  HPI    77 year old female with history of diabetes, arthritis of her spine comes in with chief complaint of severe back pain.  Patient states that she was following up with [facility] for her back pain.  They sent her to a spine specialist and she has received epidural injections along with joint injections recently with no relief.  She is supposed to follow-up with [facility] on 19th.  She comes in because of 3 weeks of back pain with radiation of the pain down her left lower extremity.  Pain is described as sharp with some numbness and tingling in her left lower extremity.  The pain shooting down the left leg is new for her.  She has been taking ibuprofen without significant relief.  Pt has no associated weakness, urinary incontinence, urinary retention, bowel incontinence, pins and needle sensation in the perineal area.   Home Medications Prior to Admission medications   Medication Sig Start Date End Date Taking? Authorizing Provider  acetaminophen (TYLENOL) 500 MG tablet Take 1 tablet (500 mg total) by mouth every 6 (six) hours as needed. 11/15/22  Yes Derwood Kaplan, MD  ibuprofen (ADVIL) 600 MG tablet Take 1 tablet (600 mg total) by mouth 4 (four) times daily. 11/15/22  Yes Derwood Kaplan, MD  albuterol (VENTOLIN HFA) 108 (90 Base) MCG/ACT inhaler Inhale 1-2 puffs into the lungs every 6 (six) hours as needed for wheezing or shortness of breath.    [provider]  ALPRAZolam Prudy Feeler) 0.5 MG tablet Take 1 tablet (0.5 mg total) by mouth 4 (four) times daily as needed for anxiety. 07/22/22   Corie Chiquito, PMHNP  atorvastatin (LIPITOR) 40 MG tablet Take 1 tablet (40 mg total) by mouth See admin instructions. Take 40 mg by mouth at 5  PM 06/12/22   Rodolph Bong, MD  b complex vitamins capsule Take 1 capsule by mouth daily.    [provider]  bismuth subsalicylate (PEPTO BISMOL) 262 MG/15ML suspension Take 30 mLs by mouth every 6 (six) hours as needed.    [provider]  calcium carbonate (TUMS EX) 750 MG chewable tablet Chew 1 tablet by mouth as needed.    [provider]  cholecalciferol (VITAMIN D3) 25 MCG (1000 UNIT) tablet Take 1,000-2,000 Units by mouth See admin instructions. Take 1,000 units by mouth in the morning and 2,000 units at 5 PM    [provider]  Continuous Glucose Sensor (FREESTYLE LIBRE 2 SENSOR) MISC 1 Units by Does not apply route every 14 (fourteen) days. 10/23/22 11/22/22  Reather Littler, MD  CONTOUR NEXT TEST test strip USE TO TEST BLOOD SUGAR TWICE DAILY 08/01/21   Reather Littler, MD  diclofenac (VOLTAREN) 75 MG EC tablet Take 75 mg by mouth as needed. 05/11/22   [provider]  dicyclomine (BENTYL) 20 MG tablet Take 1 tablet (20 mg total) by mouth 3 (three) times daily as needed for spasms. 06/17/21   Burnadette Pop, MD  divalproex (DEPAKOTE) 250 MG DR tablet Take 2 tablets (500 mg total) by mouth at bedtime. 09/08/22   Corie Chiquito, PMHNP  ezetimibe (ZETIA) 10 MG tablet Take 1 tablet (10 mg total) by mouth every evening. 06/12/22   Rodolph Bong,  MD  fluticasone (FLONASE) 50 MCG/ACT nasal spray Place 1 spray into both nostrils 2 (two) times daily as needed for allergies or rhinitis.    [provider]  FUROSEMIDE PO Take by mouth as needed.    [provider]  gabapentin (NEURONTIN) 600 MG tablet Take 600 mg by mouth 2 (two) times daily. Take 600 mg by mouth in the morning, at 5 PM, and at bedtime 05/12/19   [provider]  insulin glargine (LANTUS SOLOSTAR) 100 UNIT/ML Solostar Pen ADMINISTER 76 UNITS UNDER THE SKIN AT BEDTIME 10/05/22   Reather Littler, MD  insulin lispro (HUMALOG KWIKPEN) 100 UNIT/ML KwikPen Inject 14-18 Units into the  skin 2 (two) times daily before a meal. 07/06/22   Reather Littler, MD  Ipratropium-Albuterol (COMBIVENT RESPIMAT) 20-100 MCG/ACT AERS respimat Inhale 1 puff into the lungs in the morning, at noon, and at bedtime for 7 days, THEN 1 puff every 6 (six) hours as needed for wheezing. 06/05/22 07/12/22  Rodolph Bong, MD  levothyroxine (SYNTHROID) 75 MCG tablet Take 1 tablet (75 mcg total) by mouth daily before breakfast. 03/26/22   Reather Littler, MD  lidocaine (XYLOCAINE) 2 % solution Use as directed 15 mLs in the mouth or throat as directed. 06/01/22   [provider]  liothyronine (CYTOMEL) 5 MCG tablet TAKE 1 TABLET BY MOUTH DAILY WITH LEVOTHYROXINE  TAKE BEFORE BREAKFAST Patient taking differently: Take 5 mcg by mouth daily before breakfast. 03/26/22   Reather Littler, MD  loperamide (IMODIUM) 2 MG capsule Take 2 capsules (4 mg total) by mouth 3 (three) times daily as needed for diarrhea or loose stools. 11/12/21   Danford, Earl Lites, MD  loratadine (CLARITIN) 10 MG tablet Take 10 mg by mouth daily as needed for allergies.    [provider]  losartan (COZAAR) 25 MG tablet Take 25 mg by mouth See admin instructions. Take 25 mg by mouth at 5 PM 08/31/19   [provider]  metFORMIN (GLUCOPHAGE-XR) 500 MG 24 hr tablet Take 1 tablet (500 mg total) by mouth 2 (two) times daily with a meal. 09/08/22   Worthy Rancher, MD  methocarbamol (ROBAXIN) 750 MG tablet Take 1 tablet (750 mg total) by mouth 3 (three) times daily as needed (muscle spasm/pain). 10/22/22   Cathren Laine, MD  Microlet Lancets MISC TEST DAILY AS DIRECTED 08/01/21   Reather Littler, MD  nitroGLYCERIN (NITROSTAT) 0.4 MG SL tablet Place 1 tablet (0.4 mg total) under the tongue every 5 (five) minutes as needed for chest pain. 09/14/17   Robbie Lis M, PA-C  ondansetron (ZOFRAN) 4 MG tablet Take 1 tablet (4 mg total) by mouth every 6 (six) hours. Patient taking differently: Take 4 mg by mouth every 6 (six) hours as needed for  nausea or vomiting. 09/20/21   Curatolo, Adam, DO  pantoprazole (PROTONIX) 40 MG tablet Take 1 tablet (40 mg total) by mouth daily at 6 (six) AM. 06/06/22   Rodolph Bong, MD  POTASSIUM PO Take by mouth as needed.    [provider]  QUEtiapine (SEROQUEL) 25 MG tablet Take 1/2-1 tablets at bedtime 10/07/22   Corie Chiquito, PMHNP  rOPINIRole (REQUIP) 0.5 MG tablet Takes 4 tabs at 5 pm and 3 tabs at bedtime and 1-2 tabs prn 10/07/22   Corie Chiquito, PMHNP  Wheat Dextrin (BENEFIBER PO) Take 4-5 g by mouth daily as needed (for constipation- mix as directed).    [provider]      Allergies  Codeine, Procaine hcl, Epinephrine, Ozempic (0.25 or 0.5 mg-dose) [semaglutide(0.25 or 0.5mg -dos)], Aspirin, Augmentin [amoxicillin-pot clavulanate], Benadryl [diphenhydramine], Diflucan [fluconazole], Dilaudid [hydromorphone hcl], Hydromorphone, Morphine, Other, Prednisone, Tramadol hcl, Macrobid [nitrofurantoin], and Sulfa antibiotics    Review of Systems   Review of Systems  All other systems reviewed and are negative.   Physical Exam Updated Vital Signs BP (!) 142/70 (BP Location: Right Arm)   Pulse 88   Temp 98.2 F (36.8 C) (Oral)   Resp 18   Ht 5' (1.524 m)   Wt 81.6 kg   SpO2 91%   BMI 35.15 kg/m  Physical Exam Vitals and nursing note reviewed.  Constitutional:      Appearance: She is well-developed.  HENT:     Head: Atraumatic.  Eyes:     Extraocular Movements: Extraocular movements intact.     Pupils: Pupils are equal, round, and reactive to light.  Cardiovascular:     Rate and Rhythm: Normal rate.  Pulmonary:     Effort: Pulmonary effort is normal.  Musculoskeletal:     Cervical back: Normal range of motion and neck supple.     Comments: Positive passive leg raise test of the left lower extremity  Skin:    General: Skin is warm and dry.  Neurological:     Mental Status: She is alert and oriented to person, place, and time.     ED Results /  Procedures / Treatments   Labs (all labs ordered are listed, but only abnormal results are displayed) Labs Reviewed  URINALYSIS, ROUTINE W REFLEX MICROSCOPIC    EKG None  Radiology No results found.  Procedures Procedures    Medications Ordered in ED Medications  lidocaine (LIDODERM) 5 % 1 patch (1 patch Transdermal Patch Applied 11/15/22 1205)  fentaNYL (SUBLIMAZE) injection 50 mcg (50 mcg Intravenous Given 11/15/22 1213)  ketorolac (TORADOL) 30 MG/ML injection 9.9 mg (9.9 mg Intravenous Given 11/15/22 1212)  diazepam (VALIUM) tablet 2 mg (2 mg Oral Given 11/15/22 1204)  dexamethasone (DECADRON) injection 10 mg (10 mg Intravenous Given 11/15/22 1211)    ED Course/ Medical Decision Making/ A&P Clinical Course as of 11/15/22 1310  Sun Nov 15, 2022  1310 Symptoms improved.  Stable for discharge. [AN]    Clinical Course User Index [AN] Derwood Kaplan, MD                                 Medical Decision Making Amount and/or Complexity of Data Reviewed Labs: ordered.  Risk OTC drugs. Prescription drug management.   77 year old female comes in with chief complaint of back pain with pain radiating down her left lower extremity.  She has known history of chronic degenerative spine disease.  She did have an epidural injection few weeks back, but no relief.  Patient denies any fevers, chills, sweats. - DJD of the back - Spondylitises/ spondylosis - Sciatica /lumbar radiculopathy - Spinal cord compression - Conus medullaris - Epidural hematoma - Epidural abscess - Lytic/pathologic fracture  Based on lack of systemic symptoms, ongoing symptoms for now 3 weeks -we suspect that this is progression of her chronic degenerative spine disease and not spinal cord disease or infection.  Will try to be more aggressive with her pain management.  I reviewed patient's records including [place] controlled substance database.  She has picked up pain medication more recently, but she  has been afraid about that information.  Final Clinical Impression(s) / ED Diagnoses  Final diagnoses:  Lumbosacral radiculopathy    Rx / DC Orders ED Discharge Orders          Ordered    oxyCODONE-acetaminophen (PERCOCET/ROXICET) 5-325 MG tablet  Every 8 hours PRN,   Status:  Discontinued        11/15/22 1158    acetaminophen (TYLENOL) 500 MG tablet  Every 6 hours PRN        11/15/22 1204    ibuprofen (ADVIL) 600 MG tablet  4 times daily        11/15/22 1204              Derwood Kaplan, MD 11/15/22 1310

## 2022-11-15 NOTE — Discharge Instructions (Addendum)
We saw you in the ER for back pain. Fortunately, our evaluation is not concerning for emergent pathology such as spinal cord compression or infection.   Often the pain is self limiting, you just need time and supportive medications such as ibuprofen every 6-8 hours for the next few days. Take the muscle relaxant as needed, and see your pain doctor or spine doctor as planned.   Please return to the ER if your pain becomes excruciating or you start developing new numbness, weakness, urinary incontinence (peeing on self without warning), urinary retention (not able to pee despite bladder feeling full), inability to defecate, pins and needles sensation by your ano-genital area.

## 2022-11-15 NOTE — ED Triage Notes (Signed)
Pt referred to the ED from [facility]. Pt c/o lower back pain with radiation down L leg x 3-4 weeks. Denies urinary or fecal incontinence.

## 2022-11-15 NOTE — ED Notes (Signed)
Ms. Kalfas wears CPAP at home with 1.5L O2.  RN gave her pain medicine and RT placed her on 2L O2 while she is sleeping.

## 2022-11-16 ENCOUNTER — Ambulatory Visit (INDEPENDENT_AMBULATORY_CARE_PROVIDER_SITE_OTHER): Payer: Medicare Other | Admitting: Psychiatry

## 2022-11-16 ENCOUNTER — Encounter: Payer: Self-pay | Admitting: Psychiatry

## 2022-11-16 DIAGNOSIS — G2581 Restless legs syndrome: Secondary | ICD-10-CM

## 2022-11-16 DIAGNOSIS — F319 Bipolar disorder, unspecified: Secondary | ICD-10-CM | POA: Diagnosis not present

## 2022-11-16 DIAGNOSIS — F411 Generalized anxiety disorder: Secondary | ICD-10-CM

## 2022-11-16 MED ORDER — ROPINIROLE HCL 0.5 MG PO TABS
ORAL_TABLET | ORAL | 0 refills | Status: DC
Start: 2022-11-16 — End: 2023-02-03

## 2022-11-16 MED ORDER — DIVALPROEX SODIUM 250 MG PO DR TAB: 500.0000 mg | DELAYED_RELEASE_TABLET | Freq: Every day | ORAL | 0 refills | Status: DC

## 2022-11-16 MED ORDER — ALPRAZOLAM 0.5 MG PO TABS
0.5000 mg | ORAL_TABLET | Freq: Four times a day (QID) | ORAL | 2 refills | Status: DC | PRN
Start: 2022-12-12 — End: 2022-12-11
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
## 2022-11-19 ENCOUNTER — Other Ambulatory Visit (INDEPENDENT_AMBULATORY_CARE_PROVIDER_SITE_OTHER): Payer: Medicare Other

## 2022-11-19 DIAGNOSIS — Z794 Long term (current) use of insulin: Secondary | ICD-10-CM | POA: Diagnosis not present

## 2022-11-19 DIAGNOSIS — E782 Mixed hyperlipidemia: Secondary | ICD-10-CM | POA: Diagnosis not present

## 2022-11-19 DIAGNOSIS — E1165 Type 2 diabetes mellitus with hyperglycemia: Secondary | ICD-10-CM | POA: Diagnosis not present

## 2022-11-19 LAB — COMPREHENSIVE METABOLIC PANEL
ALT: 17 U/L (ref 0–35)
AST: 16 U/L (ref 0–37)
Albumin: 3.7 g/dL (ref 3.5–5.2)
Alkaline Phosphatase: 104 U/L (ref 39–117)
BUN: 15 mg/dL (ref 6–23)
CO2: 27 meq/L (ref 19–32)
Calcium: 10.1 mg/dL (ref 8.4–10.5)
Chloride: 105 meq/L (ref 96–112)
Creatinine, Ser: 1 mg/dL (ref 0.40–1.20)
GFR: 55.37 mL/min — ABNORMAL LOW (ref >60.00)
Glucose, Bld: 99 mg/dL (ref 70–99)
Potassium: 5.5 meq/L — ABNORMAL HIGH (ref 3.5–5.1)
Sodium: 139 meq/L (ref 135–145)
Total Bilirubin: 0.5 mg/dL (ref 0.2–1.2)
Total Protein: 6 g/dL (ref 6.0–8.3)

## 2022-11-19 LAB — LIPID PANEL
Cholesterol: 143 mg/dL (ref 0–200)
HDL: 48 mg/dL (ref >39.00)
LDL Cholesterol: 45 mg/dL (ref 0–99)
NonHDL: 95.36
Total CHOL/HDL Ratio: 3
Triglycerides: 250 mg/dL — ABNORMAL HIGH (ref 0.0–149.0)
VLDL: 50 mg/dL — ABNORMAL HIGH (ref 0.0–40.0)

## 2022-11-19 LAB — HEMOGLOBIN A1C: Hgb A1c MFr Bld: 7.2 % — ABNORMAL HIGH (ref 4.6–6.5)

## 2022-11-24 DIAGNOSIS — K146 Glossodynia: Secondary | ICD-10-CM | POA: Diagnosis not present

## 2022-11-25 ENCOUNTER — Ambulatory Visit (INDEPENDENT_AMBULATORY_CARE_PROVIDER_SITE_OTHER): Payer: Medicare Other | Admitting: Physician Assistant

## 2022-11-25 DIAGNOSIS — M5416 Radiculopathy, lumbar region: Secondary | ICD-10-CM | POA: Diagnosis not present

## 2022-11-26 ENCOUNTER — Ambulatory Visit (INDEPENDENT_AMBULATORY_CARE_PROVIDER_SITE_OTHER): Payer: Medicare Other | Admitting: Endocrinology

## 2022-11-26 ENCOUNTER — Encounter: Payer: Self-pay | Admitting: Endocrinology

## 2022-11-26 VITALS — BP 130/84 | HR 84 | Resp 16 | Ht 60.0 in | Wt 180.6 lb

## 2022-11-26 DIAGNOSIS — E875 Hyperkalemia: Secondary | ICD-10-CM | POA: Diagnosis not present

## 2022-11-26 DIAGNOSIS — E039 Hypothyroidism, unspecified: Secondary | ICD-10-CM | POA: Diagnosis not present

## 2022-11-26 DIAGNOSIS — E1142 Type 2 diabetes mellitus with diabetic polyneuropathy: Secondary | ICD-10-CM

## 2022-11-26 DIAGNOSIS — E042 Nontoxic multinodular goiter: Secondary | ICD-10-CM | POA: Diagnosis not present

## 2022-11-26 DIAGNOSIS — E1165 Type 2 diabetes mellitus with hyperglycemia: Secondary | ICD-10-CM

## 2022-11-26 DIAGNOSIS — Z794 Long term (current) use of insulin: Secondary | ICD-10-CM | POA: Diagnosis not present

## 2022-11-26 NOTE — Patient Instructions (Signed)
Diabetes regimen:  Lantus 76 units daily.  Humaolog sliding scale as needed including during epidural/ steroid time, use before meals.   Moderate Sliding Scale Blood Glucose        Insulin 60-150                     None 151-200                   3 units 201-250                   5 units 251-300                   7 units 301-350                   9 units 351-400                  11 units    >400                       12 units and call provider
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
## 2022-11-30 DIAGNOSIS — M4726 Other spondylosis with radiculopathy, lumbar region: Secondary | ICD-10-CM | POA: Diagnosis not present

## 2022-12-02 DIAGNOSIS — M816 Localized osteoporosis [Lequesne]: Secondary | ICD-10-CM | POA: Diagnosis not present

## 2022-12-02 DIAGNOSIS — M47816 Spondylosis without myelopathy or radiculopathy, lumbar region: Secondary | ICD-10-CM | POA: Diagnosis not present

## 2022-12-03 DIAGNOSIS — Z23 Encounter for immunization: Secondary | ICD-10-CM | POA: Diagnosis not present

## 2022-12-10 DIAGNOSIS — Z5181 Encounter for therapeutic drug level monitoring: Secondary | ICD-10-CM | POA: Diagnosis not present

## 2022-12-10 DIAGNOSIS — Z79891 Long term (current) use of opiate analgesic: Secondary | ICD-10-CM | POA: Diagnosis not present

## 2022-12-10 DIAGNOSIS — M5459 Other low back pain: Secondary | ICD-10-CM | POA: Diagnosis not present

## 2022-12-10 DIAGNOSIS — G894 Chronic pain syndrome: Secondary | ICD-10-CM | POA: Diagnosis not present

## 2022-12-10 DIAGNOSIS — M9953 Intervertebral disc stenosis of neural canal of lumbar region: Secondary | ICD-10-CM | POA: Diagnosis not present

## 2022-12-10 DIAGNOSIS — G629 Polyneuropathy, unspecified: Secondary | ICD-10-CM | POA: Diagnosis not present

## 2022-12-11 ENCOUNTER — Encounter: Payer: Self-pay | Admitting: Psychiatry

## 2022-12-11 ENCOUNTER — Ambulatory Visit (INDEPENDENT_AMBULATORY_CARE_PROVIDER_SITE_OTHER): Payer: Medicare Other | Admitting: Psychiatry

## 2022-12-11 DIAGNOSIS — F5101 Primary insomnia: Secondary | ICD-10-CM

## 2022-12-11 DIAGNOSIS — G2581 Restless legs syndrome: Secondary | ICD-10-CM

## 2022-12-11 DIAGNOSIS — F411 Generalized anxiety disorder: Secondary | ICD-10-CM | POA: Diagnosis not present

## 2022-12-11 DIAGNOSIS — F319 Bipolar disorder, unspecified: Secondary | ICD-10-CM

## 2022-12-11 MED ORDER — ALPRAZOLAM 0.5 MG PO TABS
ORAL_TABLET | ORAL | Status: DC
Start: 2022-12-12 — End: 2023-01-06
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
## 2022-12-16 ENCOUNTER — Ambulatory Visit (INDEPENDENT_AMBULATORY_CARE_PROVIDER_SITE_OTHER): Payer: Medicare Other | Admitting: Internal Medicine

## 2022-12-17 ENCOUNTER — Other Ambulatory Visit: Payer: Self-pay

## 2022-12-17 DIAGNOSIS — Z794 Long term (current) use of insulin: Secondary | ICD-10-CM

## 2022-12-17 MED ORDER — LANTUS SOLOSTAR 100 UNIT/ML ~~LOC~~ SOPN: PEN_INJECTOR | SUBCUTANEOUS | 2 refills | Status: DC

## 2022-12-18 ENCOUNTER — Other Ambulatory Visit (INDEPENDENT_AMBULATORY_CARE_PROVIDER_SITE_OTHER): Payer: Medicare Other

## 2022-12-18 DIAGNOSIS — E875 Hyperkalemia: Secondary | ICD-10-CM | POA: Diagnosis not present

## 2022-12-18 LAB — BASIC METABOLIC PANEL
BUN: 11 mg/dL (ref 6–23)
CO2: 27 meq/L (ref 19–32)
Calcium: 10.6 mg/dL — ABNORMAL HIGH (ref 8.4–10.5)
Chloride: 104 meq/L (ref 96–112)
Creatinine, Ser: 0.78 mg/dL (ref 0.40–1.20)
GFR: 74.57 mL/min (ref >60.00)
Glucose, Bld: 145 mg/dL — ABNORMAL HIGH (ref 70–99)
Potassium: 4.8 meq/L (ref 3.5–5.1)
Sodium: 139 meq/L (ref 135–145)

## 2022-12-21 ENCOUNTER — Telehealth: Payer: Self-pay | Admitting: Endocrinology

## 2022-12-21 DIAGNOSIS — M5416 Radiculopathy, lumbar region: Secondary | ICD-10-CM | POA: Diagnosis not present

## 2022-12-21 NOTE — Telephone Encounter (Signed)
patient had epidural and blooding sugar is in 300's even after extra insulin at 1P - requesting to speak with someone clinic to ensure she is using the dosing chart correctly

## 2022-12-21 NOTE — Telephone Encounter (Signed)
I called and spoke with on-call MD - Dr Elvera Lennox - advised that a call from patient stating blood sugars in the 300's after epidural on the morning of 12/21/22. Patient advised did take 5 additional units of insulin at 1P and blood sugar still is high in the 300's @ 450P.      Dr Elvera Lennox advised for me to relay to patient to take 10 additional units now, wait 30 minutes, then bolus for meal with 20-25 units (instead of usual 14-18 units). Monitor blood sugar and call after hours service if blood sugar does not come down. Unable to reach patient or any of her contacts so I left voicemail on patients preferred number of [phone]

## 2022-12-22 NOTE — Telephone Encounter (Signed)
Called ~5:40 pm yesterday on the After hours line as the patient did not get the previous message.  I gave the nurse the same instructions as detailed in the note and instructed her to to call the after-hours line if her sugars are not coming down.  Advised her to continue with a higher dose of NovoLog before meals, 20-25 units until sugars started to decrease (~150 mg/dL before meals), after which she could go back to her usual dose.  I did not recommend a change in Lantus dose.

## 2022-12-23 ENCOUNTER — Other Ambulatory Visit: Payer: Self-pay | Admitting: Family Medicine

## 2022-12-23 DIAGNOSIS — Z5181 Encounter for therapeutic drug level monitoring: Secondary | ICD-10-CM | POA: Diagnosis not present

## 2022-12-23 DIAGNOSIS — M5459 Other low back pain: Secondary | ICD-10-CM | POA: Diagnosis not present

## 2022-12-23 DIAGNOSIS — G894 Chronic pain syndrome: Secondary | ICD-10-CM | POA: Diagnosis not present

## 2022-12-23 DIAGNOSIS — M7989 Other specified soft tissue disorders: Secondary | ICD-10-CM

## 2022-12-23 DIAGNOSIS — Z79891 Long term (current) use of opiate analgesic: Secondary | ICD-10-CM | POA: Diagnosis not present

## 2022-12-24 ENCOUNTER — Other Ambulatory Visit: Payer: Medicare Other

## 2022-12-24 ENCOUNTER — Ambulatory Visit
Admission: RE | Admit: 2022-12-24 | Discharge: 2022-12-24 | Disposition: A | Discharge status: Home / Self Care | Payer: Medicare Other | Source: Ambulatory Visit | Attending: Family Medicine | Admitting: Family Medicine

## 2022-12-24 ENCOUNTER — Other Ambulatory Visit: Payer: Self-pay | Admitting: Family Medicine

## 2022-12-24 DIAGNOSIS — G894 Chronic pain syndrome: Secondary | ICD-10-CM | POA: Diagnosis not present

## 2022-12-24 DIAGNOSIS — R7981 Abnormal blood-gas level: Secondary | ICD-10-CM

## 2022-12-24 DIAGNOSIS — M51362 Other intervertebral disc degeneration, lumbar region with discogenic back pain and lower extremity pain: Secondary | ICD-10-CM | POA: Diagnosis not present

## 2022-12-24 DIAGNOSIS — Z79891 Long term (current) use of opiate analgesic: Secondary | ICD-10-CM | POA: Diagnosis not present

## 2022-12-24 DIAGNOSIS — Z5181 Encounter for therapeutic drug level monitoring: Secondary | ICD-10-CM | POA: Diagnosis not present

## 2022-12-24 DIAGNOSIS — R399 Unspecified symptoms and signs involving the genitourinary system: Secondary | ICD-10-CM | POA: Diagnosis not present

## 2022-12-24 DIAGNOSIS — R053 Chronic cough: Secondary | ICD-10-CM | POA: Diagnosis not present

## 2022-12-24 DIAGNOSIS — R5383 Other fatigue: Secondary | ICD-10-CM | POA: Diagnosis not present

## 2022-12-24 DIAGNOSIS — R531 Weakness: Secondary | ICD-10-CM | POA: Diagnosis not present

## 2022-12-24 DIAGNOSIS — R0602 Shortness of breath: Secondary | ICD-10-CM | POA: Diagnosis not present

## 2022-12-24 DIAGNOSIS — R110 Nausea: Secondary | ICD-10-CM | POA: Diagnosis not present

## 2022-12-24 DIAGNOSIS — M5459 Other low back pain: Secondary | ICD-10-CM | POA: Diagnosis not present

## 2022-12-28 ENCOUNTER — Other Ambulatory Visit: Payer: Self-pay

## 2022-12-28 ENCOUNTER — Emergency Department (HOSPITAL_BASED_OUTPATIENT_CLINIC_OR_DEPARTMENT_OTHER)
Admission: EM | Admit: 2022-12-28 | Discharge: 2022-12-28 | Disposition: A | Discharge status: Home / Self Care | Payer: Medicare Other | Attending: Emergency Medicine | Admitting: Emergency Medicine

## 2022-12-28 ENCOUNTER — Encounter (HOSPITAL_BASED_OUTPATIENT_CLINIC_OR_DEPARTMENT_OTHER): Payer: Self-pay | Admitting: *Deleted

## 2022-12-28 DIAGNOSIS — Z794 Long term (current) use of insulin: Secondary | ICD-10-CM | POA: Insufficient documentation

## 2022-12-28 DIAGNOSIS — M5442 Lumbago with sciatica, left side: Secondary | ICD-10-CM | POA: Insufficient documentation

## 2022-12-28 DIAGNOSIS — G8929 Other chronic pain: Secondary | ICD-10-CM

## 2022-12-28 DIAGNOSIS — E1165 Type 2 diabetes mellitus with hyperglycemia: Secondary | ICD-10-CM

## 2022-12-28 DIAGNOSIS — M549 Dorsalgia, unspecified: Secondary | ICD-10-CM | POA: Diagnosis not present

## 2022-12-28 DIAGNOSIS — R509 Fever, unspecified: Secondary | ICD-10-CM | POA: Diagnosis not present

## 2022-12-28 DIAGNOSIS — M545 Low back pain, unspecified: Secondary | ICD-10-CM | POA: Diagnosis not present

## 2022-12-28 DIAGNOSIS — I10 Essential (primary) hypertension: Secondary | ICD-10-CM | POA: Diagnosis not present

## 2022-12-28 MED ORDER — LEVOTHYROXINE SODIUM 75 MCG PO TABS: 75.0000 ug | ORAL_TABLET | Freq: Every day | ORAL | 3 refills | Status: DC

## 2022-12-28 MED ORDER — FENTANYL CITRATE PF 50 MCG/ML IJ SOSY
100.0000 ug | PREFILLED_SYRINGE | Freq: Once | INTRAMUSCULAR | Status: AC
  Administered 2022-12-28: 100 ug via INTRAMUSCULAR
  Filled 2022-12-28: qty 2

## 2022-12-28 MED ORDER — LIOTHYRONINE SODIUM 5 MCG PO TABS: ORAL_TABLET | ORAL | 3 refills | Status: DC

## 2022-12-28 MED ORDER — KETOROLAC TROMETHAMINE 60 MG/2ML IM SOLN
30.0000 mg | Freq: Once | INTRAMUSCULAR | Status: AC
  Administered 2022-12-28: 30 mg via INTRAMUSCULAR
  Filled 2022-12-28: qty 2

## 2022-12-28 NOTE — Discharge Instructions (Signed)
Continue home medications as previously prescribed.  Follow-up with your pain management doctor if not improving.

## 2022-12-28 NOTE — ED Triage Notes (Signed)
C/o lower back pain that started hurting one week ago after an epidural. States pain radiates down her left leg. Describes as a sharp pain. Took oxycodone 2pm yesterday. Denies any issues controlling bowels/bladder.

## 2022-12-28 NOTE — ED Notes (Signed)
Richard Allison Quarry notified of Phaith Bobek DC and taxi transport to residence. Verbalized complete understanding.

## 2022-12-28 NOTE — ED Provider Notes (Signed)
[place] EMERGENCY DEPARTMENT AT [facility] Provider Note   CSN: [id number] Arrival date & time: 12/28/22  0405     History  Chief Complaint  Patient presents with   Back Pain    [name] [name] is a 77 y.o. female.  Patient is a 77 year old female with past medical history of chronic back issues followed by a spine surgeon.  Patient was seen 1 week ago for similar issues and received an epidural steroid injection.  This has since worn off and she is having increased pain in her low back.  This is radiating into her left leg.  She denies any weakness or numbness.  She denies any bowel or bladder complaints.  Pain is worse with attempting to ambulate and movement.  She has been taking Percocet at home with little relief.  The history is provided by the patient.       Home Medications Prior to Admission medications   Medication Sig Start Date End Date Taking? Authorizing Provider  oxyCODONE-acetaminophen (PERCOCET/ROXICET) 5-325 MG tablet Take 1 tablet by mouth 2 (two) times daily.   Yes [provider]  acetaminophen (TYLENOL) 500 MG tablet Take 1 tablet (500 mg total) by mouth every 6 (six) hours as needed. 11/15/22   Derwood Kaplan, MD  albuterol (VENTOLIN HFA) 108 (90 Base) MCG/ACT inhaler Inhale 1-2 puffs into the lungs every 6 (six) hours as needed for wheezing or shortness of breath.    [provider]  ALPRAZolam Prudy Feeler) 0.5 MG tablet Take 1 tablet in the morning and 1/2 tablet in the evening for one week, then 1/2 tablet twice daily for one week, then 1/2 tablet daily for one week, then stop 12/12/22   Corie Chiquito, PMHNP  atorvastatin (LIPITOR) 40 MG tablet Take 1 tablet (40 mg total) by mouth See admin instructions. Take 40 mg by mouth at 5 PM 06/12/22   Rodolph Bong, MD  b complex vitamins capsule Take 1 capsule by mouth daily.    [provider]  bismuth subsalicylate (PEPTO BISMOL) 262 MG/15ML suspension Take 30 mLs by mouth  every 6 (six) hours as needed.    [provider]  calcium carbonate (TUMS EX) 750 MG chewable tablet Chew 1 tablet by mouth as needed.    [provider]  cholecalciferol (VITAMIN D3) 25 MCG (1000 UNIT) tablet Take 1,000-2,000 Units by mouth See admin instructions. Take 1,000 units by mouth in the morning and 2,000 units at 5 PM    [provider]  CONTOUR NEXT TEST test strip USE TO TEST BLOOD SUGAR TWICE DAILY 08/01/21   Reather Littler, MD  cyclobenzaprine (FLEXERIL) 5 MG tablet Take 5-10 mg by mouth every 8 (eight) hours as needed. 10/30/22   [provider]  diclofenac (VOLTAREN) 75 MG EC tablet Take 75 mg by mouth as needed. 05/11/22   [provider]  dicyclomine (BENTYL) 20 MG tablet Take 1 tablet (20 mg total) by mouth 3 (three) times daily as needed for spasms. 06/17/21   Burnadette Pop, MD  divalproex (DEPAKOTE) 250 MG DR tablet Take 2 tablets (500 mg total) by mouth at bedtime. 11/16/22   Corie Chiquito, PMHNP  ezetimibe (ZETIA) 10 MG tablet Take 1 tablet (10 mg total) by mouth every evening. 06/12/22   Rodolph Bong, MD  fluticasone (FLONASE) 50 MCG/ACT nasal spray Place 1 spray into both nostrils 2 (two) times daily as needed for allergies or rhinitis.    [provider]  FUROSEMIDE PO Take  by mouth as needed.    [provider]  gabapentin (NEURONTIN) 600 MG tablet Take 600 mg by mouth 2 (two) times daily. Take 600 mg by mouth in the morning, at 5 PM, and at bedtime 05/12/19   [provider]  ibuprofen (ADVIL) 600 MG tablet Take 1 tablet (600 mg total) by mouth 4 (four) times daily. Patient taking differently: Take 600 mg by mouth every 6 (six) hours as needed. 11/15/22   Derwood Kaplan, MD  insulin glargine (LANTUS SOLOSTAR) 100 UNIT/ML Solostar Pen ADMINISTER 76 UNITS UNDER THE SKIN AT BEDTIME 12/17/22   Thapa, [place], MD  insulin lispro (HUMALOG KWIKPEN) 100 UNIT/ML KwikPen Inject 14-18 Units into the skin 2 (two) times  daily before a meal. 07/06/22   Reather Littler, MD  Ipratropium-Albuterol (COMBIVENT RESPIMAT) 20-100 MCG/ACT AERS respimat Inhale 1 puff into the lungs in the morning, at noon, and at bedtime for 7 days, THEN 1 puff every 6 (six) hours as needed for wheezing. Patient not taking: Reported on 11/16/2022 06/05/22 07/12/22  Rodolph Bong, MD  levothyroxine (SYNTHROID) 75 MCG tablet Take 1 tablet (75 mcg total) by mouth daily before breakfast. 03/26/22   Reather Littler, MD  lidocaine (XYLOCAINE) 2 % solution Use as directed 15 mLs in the mouth or throat as directed. 06/01/22   [provider]  liothyronine (CYTOMEL) 5 MCG tablet TAKE 1 TABLET BY MOUTH DAILY WITH LEVOTHYROXINE  TAKE BEFORE BREAKFAST Patient taking differently: Take 5 mcg by mouth daily before breakfast. 03/26/22   Reather Littler, MD  loperamide (IMODIUM) 2 MG capsule Take 2 capsules (4 mg total) by mouth 3 (three) times daily as needed for diarrhea or loose stools. 11/12/21   Danford, Earl Lites, MD  loratadine (CLARITIN) 10 MG tablet Take 10 mg by mouth daily as needed for allergies.    [provider]  losartan (COZAAR) 25 MG tablet Take 25 mg by mouth See admin instructions. Take 25 mg by mouth at 5 PM 08/31/19   [provider]  methocarbamol (ROBAXIN) 750 MG tablet Take 1 tablet (750 mg total) by mouth 3 (three) times daily as needed (muscle spasm/pain). 10/22/22   Cathren Laine, MD  Microlet Lancets MISC TEST DAILY AS DIRECTED 08/01/21   Reather Littler, MD  nitroGLYCERIN (NITROSTAT) 0.4 MG SL tablet Place 1 tablet (0.4 mg total) under the tongue every 5 (five) minutes as needed for chest pain. 09/14/17   Robbie Lis M, PA-C  ondansetron (ZOFRAN) 4 MG tablet Take 1 tablet (4 mg total) by mouth every 6 (six) hours. Patient taking differently: Take 4 mg by mouth every 6 (six) hours as needed for nausea or vomiting. 09/20/21   Curatolo, Adam, DO  oxyCODONE (OXY IR/ROXICODONE) 5 MG immediate release tablet Take 5 mg by mouth  2 (two) times daily as needed.    [provider]  pantoprazole (PROTONIX) 40 MG tablet Take 1 tablet (40 mg total) by mouth daily at 6 (six) AM. 06/06/22   Rodolph Bong, MD  rOPINIRole (REQUIP) 0.5 MG tablet Takes 4 tabs at 5 pm and 3 tabs at bedtime and 1-2 tabs prn 11/16/22   Corie Chiquito, PMHNP  Wheat Dextrin (BENEFIBER PO) Take 4-5 g by mouth daily as needed (for constipation- mix as directed).    [provider]      Allergies    Codeine, Procaine hcl, Epinephrine, Ozempic (0.25 or 0.5 mg-dose) [semaglutide(0.25 or 0.5mg -dos)], Aspirin, Augmentin [amoxicillin-pot clavulanate], Benadryl [diphenhydramine], Diflucan [fluconazole], Dilaudid [hydromorphone hcl], Hydromorphone,  Morphine, Other, Prednisone, Tramadol hcl, Macrobid [nitrofurantoin], and Sulfa antibiotics    Review of Systems   Review of Systems  All other systems reviewed and are negative.   Physical Exam Updated Vital Signs BP 132/66   Pulse 90   Temp 98.3 F (36.8 C)   Resp 20   Ht 5' (1.524 m)   Wt 81.6 kg   SpO2 93%   BMI 35.15 kg/m  Physical Exam Vitals and nursing note reviewed.  Constitutional:      Appearance: Normal appearance.  Pulmonary:     Effort: Pulmonary effort is normal.  Musculoskeletal:     Comments: There is tenderness to palpation in the soft tissues of the left lower lumbar region.  Skin:    General: Skin is warm and dry.  Neurological:     Mental Status: She is alert and oriented to person, place, and time.     Comments: Strength is 5 out of 5 in both lower extremities.  DTRs are 1+ and symmetrical in the patellar and Achilles tendons bilaterally.     ED Results / Procedures / Treatments   Labs (all labs ordered are listed, but only abnormal results are displayed) Labs Reviewed - No data to display  EKG None  Radiology No results found.  Procedures Procedures    Medications Ordered in ED Medications  fentaNYL (SUBLIMAZE) injection 100 mcg (has no  administration in time range)  ketorolac (TORADOL) injection 30 mg (has no administration in time range)    ED Course/ Medical Decision Making/ A&P  Patient presenting here with complaints of low back pain as described in the HPI.  This started after receiving an epidural injection of steroids by her spine surgeon/pain management.  Physical examination reveals tenderness in the low back, but symmetrical strength and reflexes.  There are no bowel or bladder complaints or other red flags that would suggest an emergent situation.  Patient will receive IM fentanyl and Toradol and advised to continue use of her Percocet.  She is to follow-up with her pain management doctor.  Final Clinical Impression(s) / ED Diagnoses Final diagnoses:  None    Rx / DC Orders ED Discharge Orders     None         Geoffery Lyons, MD 12/28/22 [phone]

## 2022-12-29 DIAGNOSIS — L304 Erythema intertrigo: Secondary | ICD-10-CM | POA: Diagnosis not present

## 2022-12-29 DIAGNOSIS — I788 Other diseases of capillaries: Secondary | ICD-10-CM | POA: Diagnosis not present

## 2022-12-30 ENCOUNTER — Telehealth: Payer: Self-pay | Admitting: Psychiatry

## 2022-12-30 DIAGNOSIS — M549 Dorsalgia, unspecified: Secondary | ICD-10-CM | POA: Diagnosis not present

## 2022-12-30 DIAGNOSIS — R110 Nausea: Secondary | ICD-10-CM | POA: Diagnosis not present

## 2022-12-30 DIAGNOSIS — F411 Generalized anxiety disorder: Secondary | ICD-10-CM

## 2022-12-30 DIAGNOSIS — J019 Acute sinusitis, unspecified: Secondary | ICD-10-CM | POA: Diagnosis not present

## 2022-12-30 DIAGNOSIS — R5381 Other malaise: Secondary | ICD-10-CM | POA: Diagnosis not present

## 2022-12-30 DIAGNOSIS — Z03818 Encounter for observation for suspected exposure to other biological agents ruled out: Secondary | ICD-10-CM | POA: Diagnosis not present

## 2022-12-30 NOTE — Telephone Encounter (Signed)
Pt is weaning off of Xanax. She thinks she is having side effects, shaking, sweaty, some headaches, hyper and not feeling well and achy. What should she do?

## 2022-12-30 NOTE — Telephone Encounter (Signed)
From 10/4 visit:  ALPRAZolam (XANAX) 0.5 MG tablet; Take 1 tablet in the morning and 1/2 tablet in the evening for one week, then 1/2 tablet twice daily for one week, then 1/2 tablet daily for one week, then stop   Patient reported sx started this week. She is on the last part of the wean, taking 1/2 tablet daily. She reports having an appt with PCP today to rule out flu or other reasons for sx. Asked that she let us know what PCP says.

## 2022-12-30 NOTE — Telephone Encounter (Signed)
Patient called to let us know that PCP tested for COVID, flu, and RSV. Results are still unknown.

## 2022-12-31 DIAGNOSIS — Z5181 Encounter for therapeutic drug level monitoring: Secondary | ICD-10-CM | POA: Diagnosis not present

## 2022-12-31 DIAGNOSIS — M5459 Other low back pain: Secondary | ICD-10-CM | POA: Diagnosis not present

## 2022-12-31 DIAGNOSIS — M51362 Other intervertebral disc degeneration, lumbar region with discogenic back pain and lower extremity pain: Secondary | ICD-10-CM | POA: Diagnosis not present

## 2022-12-31 DIAGNOSIS — G894 Chronic pain syndrome: Secondary | ICD-10-CM | POA: Diagnosis not present

## 2022-12-31 DIAGNOSIS — Z79891 Long term (current) use of opiate analgesic: Secondary | ICD-10-CM | POA: Diagnosis not present

## 2022-12-31 MED ORDER — ALPRAZOLAM 0.25 MG PO TABS
ORAL_TABLET | ORAL | 0 refills | Status: DC
Start: 2022-12-31 — End: 2023-01-06

## 2022-12-31 NOTE — Telephone Encounter (Signed)
Patient notified. She also requested via MyChart and message sent there as well. All tests were negative.

## 2022-12-31 NOTE — Addendum Note (Signed)
Addended by: Derenda Mis on: 12/31/2022 11:32 AM   Modules accepted: Orders

## 2022-12-31 NOTE — Telephone Encounter (Signed)
Patient is weaning off Xanax, is on the last part of the wean - 1/2 tablet daily. Reporting sx:  Shaking, sweaty, some headaches, hyper and not feeling well and achy.  She saw her PCP yesterday and was tested for COVID, flu, and RSV. Results are still pending.

## 2022-12-31 NOTE — Telephone Encounter (Signed)
Recommend resuming 0.25 mg twice daily for at least a week and continuing with more gradual taper. Script sent for Alprazolam 0.25 mg tablets with the following instructions: Take 1 tablet twice daily for one week, then 1 tablet in the morning and 1/2 tablet in the evening for one week, then 1/2 tablet twice daily for one week, then 1/2 tablet daily for one week, then stop.

## 2023-01-01 ENCOUNTER — Ambulatory Visit
Admission: RE | Admit: 2023-01-01 | Discharge: 2023-01-01 | Disposition: A | Discharge status: Home / Self Care | Payer: Medicare Other | Source: Ambulatory Visit | Attending: Family Medicine | Admitting: Family Medicine

## 2023-01-01 DIAGNOSIS — E042 Nontoxic multinodular goiter: Secondary | ICD-10-CM | POA: Diagnosis not present

## 2023-01-01 DIAGNOSIS — M7989 Other specified soft tissue disorders: Secondary | ICD-10-CM

## 2023-01-05 DIAGNOSIS — M47816 Spondylosis without myelopathy or radiculopathy, lumbar region: Secondary | ICD-10-CM | POA: Diagnosis not present

## 2023-01-06 ENCOUNTER — Ambulatory Visit (INDEPENDENT_AMBULATORY_CARE_PROVIDER_SITE_OTHER): Payer: Medicare Other | Admitting: Psychiatry

## 2023-01-06 ENCOUNTER — Encounter: Payer: Self-pay | Admitting: Psychiatry

## 2023-01-06 DIAGNOSIS — F5101 Primary insomnia: Secondary | ICD-10-CM

## 2023-01-06 DIAGNOSIS — F411 Generalized anxiety disorder: Secondary | ICD-10-CM | POA: Diagnosis not present

## 2023-01-06 DIAGNOSIS — F319 Bipolar disorder, unspecified: Secondary | ICD-10-CM

## 2023-01-06 MED ORDER — ALPRAZOLAM 0.25 MG PO TABS
ORAL_TABLET | ORAL | Status: DC
Start: 2023-01-06 — End: 2023-03-01

## 2023-01-06 MED ORDER — HYDROXYZINE PAMOATE 25 MG PO CAPS
25.0000 mg | ORAL_CAPSULE | Freq: Every evening | ORAL | 0 refills | Status: DC | PRN
Start: 2023-01-06 — End: 2023-02-03

## 2023-01-06 MED ORDER — DIVALPROEX SODIUM 250 MG PO DR TAB: 500.0000 mg | DELAYED_RELEASE_TABLET | Freq: Every day | ORAL | 0 refills | Status: DC
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
## 2023-01-07 DIAGNOSIS — Z780 Asymptomatic menopausal state: Secondary | ICD-10-CM | POA: Diagnosis not present

## 2023-01-08 ENCOUNTER — Emergency Department (HOSPITAL_COMMUNITY): Payer: Medicare Other

## 2023-01-08 ENCOUNTER — Other Ambulatory Visit: Payer: Self-pay

## 2023-01-08 ENCOUNTER — Encounter (HOSPITAL_COMMUNITY): Payer: Self-pay

## 2023-01-08 ENCOUNTER — Emergency Department (HOSPITAL_COMMUNITY)
Admission: EM | Admit: 2023-01-08 | Discharge: 2023-01-08 | Disposition: A | Discharge status: Home / Self Care | Payer: Medicare Other | Attending: Emergency Medicine | Admitting: Emergency Medicine

## 2023-01-08 DIAGNOSIS — Z96652 Presence of left artificial knee joint: Secondary | ICD-10-CM | POA: Diagnosis not present

## 2023-01-08 DIAGNOSIS — M25562 Pain in left knee: Secondary | ICD-10-CM | POA: Diagnosis not present

## 2023-01-08 DIAGNOSIS — M79662 Pain in left lower leg: Secondary | ICD-10-CM | POA: Insufficient documentation

## 2023-01-08 DIAGNOSIS — M25552 Pain in left hip: Secondary | ICD-10-CM | POA: Diagnosis not present

## 2023-01-08 DIAGNOSIS — M1612 Unilateral primary osteoarthritis, left hip: Secondary | ICD-10-CM | POA: Diagnosis not present

## 2023-01-08 DIAGNOSIS — M79605 Pain in left leg: Secondary | ICD-10-CM | POA: Diagnosis not present

## 2023-01-08 DIAGNOSIS — R519 Headache, unspecified: Secondary | ICD-10-CM | POA: Insufficient documentation

## 2023-01-08 DIAGNOSIS — S6991XA Unspecified injury of right wrist, hand and finger(s), initial encounter: Secondary | ICD-10-CM | POA: Diagnosis not present

## 2023-01-08 DIAGNOSIS — W19XXXA Unspecified fall, initial encounter: Secondary | ICD-10-CM | POA: Diagnosis not present

## 2023-01-08 DIAGNOSIS — Z96641 Presence of right artificial hip joint: Secondary | ICD-10-CM | POA: Diagnosis not present

## 2023-01-08 DIAGNOSIS — M25531 Pain in right wrist: Secondary | ICD-10-CM | POA: Diagnosis present

## 2023-01-08 DIAGNOSIS — M542 Cervicalgia: Secondary | ICD-10-CM | POA: Diagnosis not present

## 2023-01-08 DIAGNOSIS — R0902 Hypoxemia: Secondary | ICD-10-CM | POA: Diagnosis not present

## 2023-01-08 DIAGNOSIS — M25572 Pain in left ankle and joints of left foot: Secondary | ICD-10-CM | POA: Diagnosis not present

## 2023-01-08 DIAGNOSIS — M25511 Pain in right shoulder: Secondary | ICD-10-CM | POA: Diagnosis not present

## 2023-01-08 DIAGNOSIS — R58 Hemorrhage, not elsewhere classified: Secondary | ICD-10-CM | POA: Diagnosis not present

## 2023-01-08 DIAGNOSIS — M1811 Unilateral primary osteoarthritis of first carpometacarpal joint, right hand: Secondary | ICD-10-CM | POA: Diagnosis not present

## 2023-01-08 DIAGNOSIS — Z471 Aftercare following joint replacement surgery: Secondary | ICD-10-CM | POA: Diagnosis not present

## 2023-01-08 DIAGNOSIS — W010XXA Fall on same level from slipping, tripping and stumbling without subsequent striking against object, initial encounter: Secondary | ICD-10-CM | POA: Insufficient documentation

## 2023-01-08 DIAGNOSIS — S63501A Unspecified sprain of right wrist, initial encounter: Secondary | ICD-10-CM | POA: Diagnosis not present

## 2023-01-08 DIAGNOSIS — Z043 Encounter for examination and observation following other accident: Secondary | ICD-10-CM | POA: Diagnosis not present

## 2023-01-08 DIAGNOSIS — M7989 Other specified soft tissue disorders: Secondary | ICD-10-CM | POA: Diagnosis not present

## 2023-01-08 DIAGNOSIS — M19072 Primary osteoarthritis, left ankle and foot: Secondary | ICD-10-CM | POA: Diagnosis not present

## 2023-01-08 DIAGNOSIS — I10 Essential (primary) hypertension: Secondary | ICD-10-CM | POA: Diagnosis not present

## 2023-01-08 MED ORDER — OXYCODONE HCL 5 MG PO TABS
5.0000 mg | ORAL_TABLET | Freq: Once | ORAL | Status: AC
  Administered 2023-01-08: 5 mg via ORAL
  Filled 2023-01-08: qty 1

## 2023-01-08 NOTE — Progress Notes (Signed)
Orthopedic Tech Progress Note Patient Details:  [name] [name] [birth year] [id number]  Ortho Devices Type of Ortho Device: Thumb velcro splint Ortho Device/Splint Location: Right hand Ortho Device/Splint Interventions: Application   Post Interventions Patient Tolerated: Well  [name] [name] [name] 01/08/2023, 5:47 PM

## 2023-01-08 NOTE — ED Triage Notes (Signed)
Patient BIB EMS for a fall. Patient reports she slipped on wet leaves in her driveway. C/o right shoulder and neck pain, and left lower leg pain. Received 75 mcg of fentanyl with EMS.

## 2023-01-08 NOTE — ED Provider Notes (Signed)
Pt was signed out by Dr. Rhunette Croft pending Xray and CT readings.  I reviewed films and agree with the radiologist.  Left hip: 1. No acute fracture is seen.  2. Mild left hip osteoarthritis.   L knee: Status post total left knee arthroplasty without acute fracture or  evidence of hardware failure.   L ankle:  No acute fracture.  2. Moderate plantar calcaneal heel spur.  3. Mild lateral tibiotalar osteoarthritis.   R wrist:  No acute fracture.  2. Severe thumb carpometacarpal osteoarthritis.   CT head: No acute intracranial pathology.   Pt's right wrist is swollen and painful and there is the possibility of a fx, so I did put her in a splint.  Pt is able to ambulate.   Jacalyn Lefevre, MD 01/08/23 2206

## 2023-01-08 NOTE — ED Provider Notes (Signed)
[place] EMERGENCY DEPARTMENT AT [facility] Provider Note   CSN: [id number] Arrival date & time: 01/08/23  1234     History {Add pertinent medical, surgical, social history, OB history to HPI:1} Chief Complaint  Patient presents with   [name] [name] is a 77 y.o. female.  HPI    77 year old female comes in with chief complaint of fall.  Home Medications Prior to Admission medications   Medication Sig Start Date End Date Taking? Authorizing Provider  acetaminophen (TYLENOL) 500 MG tablet Take 1 tablet (500 mg total) by mouth every 6 (six) hours as needed. 11/15/22   Derwood Kaplan, MD  albuterol (VENTOLIN HFA) 108 (90 Base) MCG/ACT inhaler Inhale 1-2 puffs into the lungs every 6 (six) hours as needed for wheezing or shortness of breath.    [provider]  ALPRAZolam Prudy Feeler) 0.25 MG tablet Take 1/2 tablet four times daily for one week, then 1/2 tablet 3 times daily for one week, then 1/2 tablet twice daily for one week, then 1/2 tablet daily for one week, then stop. 01/06/23   Corie Chiquito, PMHNP  atorvastatin (LIPITOR) 40 MG tablet Take 1 tablet (40 mg total) by mouth See admin instructions. Take 40 mg by mouth at 5 PM 06/12/22   Rodolph Bong, MD  b complex vitamins capsule Take 1 capsule by mouth daily.    [provider]  bismuth subsalicylate (PEPTO BISMOL) 262 MG/15ML suspension Take 30 mLs by mouth every 6 (six) hours as needed.    [provider]  calcium carbonate (TUMS EX) 750 MG chewable tablet Chew 1 tablet by mouth as needed.    [provider]  cholecalciferol (VITAMIN D3) 25 MCG (1000 UNIT) tablet Take 1,000-2,000 Units by mouth See admin instructions. Take 1,000 units by mouth in the morning and 2,000 units at 5 PM    [provider]  CONTOUR NEXT TEST test strip USE TO TEST BLOOD SUGAR TWICE DAILY 08/01/21   Reather Littler, MD  cyclobenzaprine (FLEXERIL) 5 MG tablet Take 5-10 mg by mouth every 8  (eight) hours as needed. 10/30/22   [provider]  diclofenac (VOLTAREN) 75 MG EC tablet Take 75 mg by mouth as needed. 05/11/22   [provider]  dicyclomine (BENTYL) 20 MG tablet Take 1 tablet (20 mg total) by mouth 3 (three) times daily as needed for spasms. 06/17/21   Burnadette Pop, MD  divalproex (DEPAKOTE) 250 MG DR tablet Take 2 tablets (500 mg total) by mouth at bedtime. 01/06/23   Corie Chiquito, PMHNP  ezetimibe (ZETIA) 10 MG tablet Take 1 tablet (10 mg total) by mouth every evening. 06/12/22   Rodolph Bong, MD  fluticasone (FLONASE) 50 MCG/ACT nasal spray Place 1 spray into both nostrils 2 (two) times daily as needed for allergies or rhinitis.    [provider]  FUROSEMIDE PO Take by mouth as needed.    [provider]  gabapentin (NEURONTIN) 600 MG tablet Take 600 mg by mouth 2 (two) times daily. Take 600 mg by mouth in the morning, at 5 PM, and at bedtime 05/12/19   [provider]  hydrOXYzine (VISTARIL) 25 MG capsule Take 1 capsule (25 mg total) by mouth at bedtime as needed for anxiety (insomnia). 01/06/23   Corie Chiquito, PMHNP  ibuprofen (ADVIL) 600 MG tablet Take 1 tablet (600 mg total) by mouth 4 (four) times daily. Patient taking differently: Take 600 mg by mouth every 6 (six) hours as  needed. 11/15/22   Derwood Kaplan, MD  insulin glargine (LANTUS SOLOSTAR) 100 UNIT/ML Solostar Pen ADMINISTER 76 UNITS UNDER THE SKIN AT BEDTIME 12/17/22   Thapa, [place], MD  insulin lispro (HUMALOG KWIKPEN) 100 UNIT/ML KwikPen Inject 14-18 Units into the skin 2 (two) times daily before a meal. 07/06/22   Reather Littler, MD  Ipratropium-Albuterol (COMBIVENT RESPIMAT) 20-100 MCG/ACT AERS respimat Inhale 1 puff into the lungs in the morning, at noon, and at bedtime for 7 days, THEN 1 puff every 6 (six) hours as needed for wheezing. Patient not taking: Reported on 11/16/2022 06/05/22 07/12/22  Rodolph Bong, MD  levothyroxine (SYNTHROID) 75 MCG tablet Take  1 tablet (75 mcg total) by mouth daily before breakfast. 12/28/22   Thapa, [place], MD  lidocaine (XYLOCAINE) 2 % solution Use as directed 15 mLs in the mouth or throat as directed. 06/01/22   [provider]  liothyronine (CYTOMEL) 5 MCG tablet TAKE 1 TABLET BY MOUTH DAILY WITH LEVOTHYROXINE  TAKE BEFORE BREAKFAST 12/28/22   Thapa, [place], MD  loperamide (IMODIUM) 2 MG capsule Take 2 capsules (4 mg total) by mouth 3 (three) times daily as needed for diarrhea or loose stools. 11/12/21   Danford, Earl Lites, MD  loratadine (CLARITIN) 10 MG tablet Take 10 mg by mouth daily as needed for allergies.    [provider]  losartan (COZAAR) 25 MG tablet Take 25 mg by mouth See admin instructions. Take 25 mg by mouth at 5 PM 08/31/19   [provider]  methocarbamol (ROBAXIN) 750 MG tablet Take 1 tablet (750 mg total) by mouth 3 (three) times daily as needed (muscle spasm/pain). 10/22/22   Cathren Laine, MD  Microlet Lancets MISC TEST DAILY AS DIRECTED 08/01/21   Reather Littler, MD  nitroGLYCERIN (NITROSTAT) 0.4 MG SL tablet Place 1 tablet (0.4 mg total) under the tongue every 5 (five) minutes as needed for chest pain. 09/14/17   Robbie Lis M, PA-C  ondansetron (ZOFRAN) 4 MG tablet Take 1 tablet (4 mg total) by mouth every 6 (six) hours. Patient taking differently: Take 4 mg by mouth every 6 (six) hours as needed for nausea or vomiting. 09/20/21   Curatolo, Adam, DO  oxyCODONE (OXY IR/ROXICODONE) 5 MG immediate release tablet Take 5 mg by mouth 2 (two) times daily as needed.    [provider]  oxyCODONE-acetaminophen (PERCOCET/ROXICET) 5-325 MG tablet Take 1 tablet by mouth every 8 (eight) hours as needed.    [provider]  pantoprazole (PROTONIX) 40 MG tablet Take 1 tablet (40 mg total) by mouth daily at 6 (six) AM. 06/06/22   Rodolph Bong, MD  rOPINIRole (REQUIP) 0.5 MG tablet Takes 4 tabs at 5 pm and 3 tabs at bedtime and 1-2 tabs prn 11/16/22   Corie Chiquito,  PMHNP  Wheat Dextrin (BENEFIBER PO) Take 4-5 g by mouth daily as needed (for constipation- mix as directed).    [provider]      Allergies    Codeine, Procaine hcl, Epinephrine, Ozempic (0.25 or 0.5 mg-dose) [semaglutide(0.25 or 0.5mg -dos)], Aspirin, Augmentin [amoxicillin-pot clavulanate], Benadryl [diphenhydramine], Diflucan [fluconazole], Dilaudid [hydromorphone hcl], Hydromorphone, Morphine, Other, Prednisone, Tramadol hcl, Macrobid [nitrofurantoin], and Sulfa antibiotics    Review of Systems   Review of Systems  Physical Exam Updated Vital Signs BP 136/71   Pulse 70   Temp 98 F (36.7 C)   Resp 18   Ht 5' (1.524 m)   Wt 81.6 kg   SpO2 94%   BMI 35.15 kg/m  Physical Exam  ED Results / Procedures / Treatments   Labs (all labs ordered are listed, but only abnormal results are displayed) Labs Reviewed - No data to display  EKG None  Radiology DG Knee Complete 4 Views Left  Result Date: 01/08/2023 CLINICAL DATA:  Status post total left knee arthroplasty. Fall. Pain. EXAM: LEFT KNEE - COMPLETE 4+ VIEW COMPARISON:  Left knee radiographs 09/10/2019 FINDINGS: Status post total left knee arthroplasty. No perihardware lucency is seen to indicate hardware failure or loosening. No joint effusion. No acute fracture is seen. No dislocation. IMPRESSION: Status post total left knee arthroplasty without acute fracture or evidence of hardware failure. Electronically Signed   By: Neita Garnet M.D.   On: 01/08/2023 16:06   DG Wrist Complete Right  Result Date: 01/08/2023 CLINICAL DATA:  Fall.  Slipped on wet leaves in driveway. EXAM: RIGHT WRIST - COMPLETE 3+ VIEW COMPARISON:  Right hand radiographs 10/05/2017 FINDINGS: Severe thumb carpometacarpal joint space narrowing, subchondral sclerosis, and peripheral osteophytosis. There is 2 mm ulnar negative variance. Mild distal radioulnar joint space narrowing and subchondral sclerosis. Mild second carpometacarpal joint space  narrowing. Mild thumb interphalangeal joint space narrowing with mild-to-moderate dorsal osteophytosis. There are two adjacent chronic ossicles measuring 3 mm and 2 mm, respectively just dorsal to the midcarpal joint on lateral view, likely the sequela of remote trauma. No acute fracture or dislocation. IMPRESSION: 1. No acute fracture. 2. Severe thumb carpometacarpal osteoarthritis. Electronically Signed   By: Neita Garnet M.D.   On: 01/08/2023 16:04    Procedures Procedures  {Document cardiac monitor, telemetry assessment procedure when appropriate:1}  Medications Ordered in ED Medications  oxyCODONE (Oxy IR/ROXICODONE) immediate release tablet 5 mg (5 mg Oral Given 01/08/23 1326)    ED Course/ Medical Decision Making/ A&P   {   Click here for ABCD2, HEART and other calculatorsREFRESH Note before signing :1}                              Medical Decision Making Amount and/or Complexity of Data Reviewed Radiology: ordered.  Risk Prescription drug management.   ***  {Document critical care time when appropriate:1} {Document review of labs and clinical decision tools ie heart score, Chads2Vasc2 etc:1}  {Document your independent review of radiology images, and any outside records:1} {Document your discussion with family members, caretakers, and with consultants:1} {Document social determinants of health affecting pt's care:1} {Document your decision making why or why not admission, treatments were needed:1} Final Clinical Impression(s) / ED Diagnoses Final diagnoses:  None    Rx / DC Orders ED Discharge Orders     None

## 2023-01-08 NOTE — ED Notes (Signed)
Patient assisted with bedpan.

## 2023-01-08 NOTE — ED Notes (Signed)
Patient ambulated. Patient steady on feet. Has walker at home she will use.

## 2023-01-12 ENCOUNTER — Telehealth: Payer: Self-pay | Admitting: Psychiatry

## 2023-01-12 NOTE — Telephone Encounter (Signed)
Patient reporting itching from the waist up, no rash. She said she had previously had palpitations but that has now resolved. No issues with SOB or tongue swelling. She took hydroxyzine this morning. Advised to stop it and see if sx resolve. She doesn't feel like she needs to go the hospital.

## 2023-01-12 NOTE — Telephone Encounter (Signed)
Attempted to reach pt again at number provided by answering service. No answer. Attempted to reach number listed as pt's home number and emergency contact number at [phone] and there was no answer.

## 2023-01-12 NOTE — Telephone Encounter (Signed)
Please continue to try to reach patient.

## 2023-01-12 NOTE — Telephone Encounter (Signed)
Received after hours call that patient recently started a new medication and is concerned about a possible allergic reaction since she felt "itchy all over" after taking medication. Attempted to call pt back x 2 and calls went directly to voicemail. Left message for patient that what she reported does sound like symptoms of an allergic reaction and if this occurred after taking hydroxyzine, recommend that she does not take any more hydroxyzine. Noted that Benadryl is also listed as a medication allergy for her and that she could therefore try taking an antihistamine that she is not allergic to. Advised her to seek urgent medical care, such as the ER or urgent care, if experiencing shortness of breath or other respiratory symptoms to include swelling of her tongue or airway. Advised pt to call on call service back with any questions or if she would like to be called at a different phone number.

## 2023-01-13 ENCOUNTER — Telehealth: Payer: Self-pay

## 2023-01-13 DIAGNOSIS — M5416 Radiculopathy, lumbar region: Secondary | ICD-10-CM | POA: Diagnosis not present

## 2023-01-13 DIAGNOSIS — M48062 Spinal stenosis, lumbar region with neurogenic claudication: Secondary | ICD-10-CM | POA: Diagnosis not present

## 2023-01-13 DIAGNOSIS — M47816 Spondylosis without myelopathy or radiculopathy, lumbar region: Secondary | ICD-10-CM | POA: Diagnosis not present

## 2023-01-13 NOTE — Telephone Encounter (Signed)
Noted  

## 2023-01-13 NOTE — Telephone Encounter (Signed)
Pt's prior authorization for Hydroxyzine 25 mg capsules has been approved effective through 01/13/2024 with [facility] Medicare but I do see noted pt may have had a reaction to the medication.

## 2023-01-17 ENCOUNTER — Emergency Department (HOSPITAL_BASED_OUTPATIENT_CLINIC_OR_DEPARTMENT_OTHER)
Admission: EM | Admit: 2023-01-17 | Discharge: 2023-01-17 | Disposition: A | Discharge status: Home / Self Care | Payer: Medicare Other | Attending: Emergency Medicine | Admitting: Emergency Medicine

## 2023-01-17 ENCOUNTER — Encounter (HOSPITAL_BASED_OUTPATIENT_CLINIC_OR_DEPARTMENT_OTHER): Payer: Self-pay | Admitting: Emergency Medicine

## 2023-01-17 ENCOUNTER — Emergency Department (HOSPITAL_BASED_OUTPATIENT_CLINIC_OR_DEPARTMENT_OTHER): Payer: Medicare Other

## 2023-01-17 DIAGNOSIS — Z794 Long term (current) use of insulin: Secondary | ICD-10-CM | POA: Diagnosis not present

## 2023-01-17 DIAGNOSIS — R110 Nausea: Secondary | ICD-10-CM | POA: Insufficient documentation

## 2023-01-17 DIAGNOSIS — N281 Cyst of kidney, acquired: Secondary | ICD-10-CM | POA: Diagnosis not present

## 2023-01-17 DIAGNOSIS — K5730 Diverticulosis of large intestine without perforation or abscess without bleeding: Secondary | ICD-10-CM | POA: Diagnosis not present

## 2023-01-17 DIAGNOSIS — R1084 Generalized abdominal pain: Secondary | ICD-10-CM | POA: Insufficient documentation

## 2023-01-17 DIAGNOSIS — K429 Umbilical hernia without obstruction or gangrene: Secondary | ICD-10-CM | POA: Diagnosis not present

## 2023-01-17 LAB — URINALYSIS, ROUTINE W REFLEX MICROSCOPIC
Bilirubin Urine: NEGATIVE
Glucose, UA: NEGATIVE mg/dL
Hgb urine dipstick: NEGATIVE
Leukocytes,Ua: NEGATIVE
Nitrite: NEGATIVE
Protein, ur: NEGATIVE mg/dL
Specific Gravity, Urine: 1.021 (ref 1.005–1.030)
pH: 6.5 (ref 5.0–8.0)

## 2023-01-17 LAB — COMPREHENSIVE METABOLIC PANEL
ALT: 13 U/L (ref 0–44)
AST: 16 U/L (ref 15–41)
Albumin: 4.3 g/dL (ref 3.5–5.0)
Alkaline Phosphatase: 80 U/L (ref 38–126)
Anion gap: 7 (ref 5–15)
BUN: 13 mg/dL (ref 8–23)
CO2: 27 mmol/L (ref 22–32)
Calcium: 10.9 mg/dL — ABNORMAL HIGH (ref 8.9–10.3)
Chloride: 104 mmol/L (ref 98–111)
Creatinine, Ser: 1.1 mg/dL — ABNORMAL HIGH (ref 0.44–1.00)
GFR, Estimated: 53 mL/min — ABNORMAL LOW (ref >60)
Glucose, Bld: 175 mg/dL — ABNORMAL HIGH (ref 70–99)
Potassium: 5.2 mmol/L — ABNORMAL HIGH (ref 3.5–5.1)
Sodium: 138 mmol/L (ref 135–145)
Total Bilirubin: 0.5 mg/dL (ref <1.2)
Total Protein: 7.3 g/dL (ref 6.5–8.1)

## 2023-01-17 LAB — CBC
HCT: 44.8 % (ref 36.0–46.0)
Hemoglobin: 14.7 g/dL (ref 12.0–15.0)
MCH: 29.2 pg (ref 26.0–34.0)
MCHC: 32.8 g/dL (ref 30.0–36.0)
MCV: 89.1 fL (ref 80.0–100.0)
Platelets: 225 10*3/uL (ref 150–400)
RBC: 5.03 MIL/uL (ref 3.87–5.11)
RDW: 13.4 % (ref 11.5–15.5)
WBC: 7.8 10*3/uL (ref 4.0–10.5)
nRBC: 0 % (ref 0.0–0.2)

## 2023-01-17 LAB — LIPASE, BLOOD: Lipase: 21 U/L (ref 11–51)

## 2023-01-17 MED ORDER — IOHEXOL 300 MG/ML  SOLN
100.0000 mL | Freq: Once | INTRAMUSCULAR | Status: AC | PRN
  Administered 2023-01-17: 100 mL via INTRAVENOUS

## 2023-01-17 MED ORDER — SODIUM CHLORIDE 0.9 % IV BOLUS
1000.0000 mL | Freq: Once | INTRAVENOUS | Status: AC
  Administered 2023-01-17: 1000 mL via INTRAVENOUS

## 2023-01-17 MED ORDER — ONDANSETRON HCL 4 MG/2ML IJ SOLN
4.0000 mg | Freq: Once | INTRAMUSCULAR | Status: AC
  Administered 2023-01-17: 4 mg via INTRAVENOUS
  Filled 2023-01-17: qty 2

## 2023-01-17 NOTE — ED Provider Notes (Signed)
[place] EMERGENCY DEPARTMENT AT [facility] Provider Note   CSN: [id number] Arrival date & time: 01/17/23  4098     History  Chief Complaint  Patient presents with   Abdominal Pain   Nausea    [name] [name] is a 77 y.o. female.  Patient with history of partial hysterectomy, sigmoidectomy due to diverticulosis, chronic pain on chronic narcotics --presents to the emergency department for generalized abdominal pain, worse on the left side over the past 3 days.  She has had associated nausea without vomiting.  She has had constipation but is passing gas.  She took an enema yesterday without much improvement.  No fevers.  No urinary symptoms.  She is concerned about diverticulitis.  She has noted some distention.  Denies history of bowel obstruction.      Home Medications Prior to Admission medications   Medication Sig Start Date End Date Taking? Authorizing Provider  acetaminophen (TYLENOL) 500 MG tablet Take 1 tablet (500 mg total) by mouth every 6 (six) hours as needed. 11/15/22   Derwood Kaplan, MD  albuterol (VENTOLIN HFA) 108 (90 Base) MCG/ACT inhaler Inhale 1-2 puffs into the lungs every 6 (six) hours as needed for wheezing or shortness of breath.    [provider]  ALPRAZolam Prudy Feeler) 0.25 MG tablet Take 1/2 tablet four times daily for one week, then 1/2 tablet 3 times daily for one week, then 1/2 tablet twice daily for one week, then 1/2 tablet daily for one week, then stop. 01/06/23   Corie Chiquito, PMHNP  atorvastatin (LIPITOR) 40 MG tablet Take 1 tablet (40 mg total) by mouth See admin instructions. Take 40 mg by mouth at 5 PM 06/12/22   Rodolph Bong, MD  b complex vitamins capsule Take 1 capsule by mouth daily.    [provider]  bismuth subsalicylate (PEPTO BISMOL) 262 MG/15ML suspension Take 30 mLs by mouth every 6 (six) hours as needed.    [provider]  calcium carbonate (TUMS EX) 750 MG chewable tablet Chew 1 tablet by  mouth as needed.    [provider]  cholecalciferol (VITAMIN D3) 25 MCG (1000 UNIT) tablet Take 1,000-2,000 Units by mouth See admin instructions. Take 1,000 units by mouth in the morning and 2,000 units at 5 PM    [provider]  CONTOUR NEXT TEST test strip USE TO TEST BLOOD SUGAR TWICE DAILY 08/01/21   Reather Littler, MD  cyclobenzaprine (FLEXERIL) 5 MG tablet Take 5-10 mg by mouth every 8 (eight) hours as needed. 10/30/22   [provider]  diclofenac (VOLTAREN) 75 MG EC tablet Take 75 mg by mouth as needed. 05/11/22   [provider]  dicyclomine (BENTYL) 20 MG tablet Take 1 tablet (20 mg total) by mouth 3 (three) times daily as needed for spasms. 06/17/21   Burnadette Pop, MD  divalproex (DEPAKOTE) 250 MG DR tablet Take 2 tablets (500 mg total) by mouth at bedtime. 01/06/23   Corie Chiquito, PMHNP  ezetimibe (ZETIA) 10 MG tablet Take 1 tablet (10 mg total) by mouth every evening. 06/12/22   Rodolph Bong, MD  fluticasone (FLONASE) 50 MCG/ACT nasal spray Place 1 spray into both nostrils 2 (two) times daily as needed for allergies or rhinitis.    [provider]  FUROSEMIDE PO Take by mouth as needed.    [provider]  gabapentin (NEURONTIN) 600 MG tablet Take 600 mg by mouth 2 (two) times daily. Take 600 mg by mouth in the morning,  at 5 PM, and at bedtime 05/12/19   [provider]  hydrOXYzine (VISTARIL) 25 MG capsule Take 1 capsule (25 mg total) by mouth at bedtime as needed for anxiety (insomnia). 01/06/23   Corie Chiquito, PMHNP  ibuprofen (ADVIL) 600 MG tablet Take 1 tablet (600 mg total) by mouth 4 (four) times daily. Patient taking differently: Take 600 mg by mouth every 6 (six) hours as needed. 11/15/22   Derwood Kaplan, MD  insulin glargine (LANTUS SOLOSTAR) 100 UNIT/ML Solostar Pen ADMINISTER 76 UNITS UNDER THE SKIN AT BEDTIME 12/17/22   Thapa, [place], MD  insulin lispro (HUMALOG KWIKPEN) 100 UNIT/ML KwikPen Inject 14-18 Units  into the skin 2 (two) times daily before a meal. 07/06/22   Reather Littler, MD  Ipratropium-Albuterol (COMBIVENT RESPIMAT) 20-100 MCG/ACT AERS respimat Inhale 1 puff into the lungs in the morning, at noon, and at bedtime for 7 days, THEN 1 puff every 6 (six) hours as needed for wheezing. Patient not taking: Reported on 11/16/2022 06/05/22 07/12/22  Rodolph Bong, MD  levothyroxine (SYNTHROID) 75 MCG tablet Take 1 tablet (75 mcg total) by mouth daily before breakfast. 12/28/22   Thapa, [place], MD  lidocaine (XYLOCAINE) 2 % solution Use as directed 15 mLs in the mouth or throat as directed. 06/01/22   [provider]  liothyronine (CYTOMEL) 5 MCG tablet TAKE 1 TABLET BY MOUTH DAILY WITH LEVOTHYROXINE  TAKE BEFORE BREAKFAST 12/28/22   Thapa, [place], MD  loperamide (IMODIUM) 2 MG capsule Take 2 capsules (4 mg total) by mouth 3 (three) times daily as needed for diarrhea or loose stools. 11/12/21   Danford, Earl Lites, MD  loratadine (CLARITIN) 10 MG tablet Take 10 mg by mouth daily as needed for allergies.    [provider]  losartan (COZAAR) 25 MG tablet Take 25 mg by mouth See admin instructions. Take 25 mg by mouth at 5 PM 08/31/19   [provider]  methocarbamol (ROBAXIN) 750 MG tablet Take 1 tablet (750 mg total) by mouth 3 (three) times daily as needed (muscle spasm/pain). 10/22/22   Cathren Laine, MD  Microlet Lancets MISC TEST DAILY AS DIRECTED 08/01/21   Reather Littler, MD  nitroGLYCERIN (NITROSTAT) 0.4 MG SL tablet Place 1 tablet (0.4 mg total) under the tongue every 5 (five) minutes as needed for chest pain. 09/14/17   Robbie Lis M, PA-C  ondansetron (ZOFRAN) 4 MG tablet Take 1 tablet (4 mg total) by mouth every 6 (six) hours. Patient taking differently: Take 4 mg by mouth every 6 (six) hours as needed for nausea or vomiting. 09/20/21   Curatolo, Adam, DO  oxyCODONE (OXY IR/ROXICODONE) 5 MG immediate release tablet Take 5 mg by mouth 2 (two) times daily as needed.     [provider]  oxyCODONE-acetaminophen (PERCOCET/ROXICET) 5-325 MG tablet Take 1 tablet by mouth every 8 (eight) hours as needed.    [provider]  pantoprazole (PROTONIX) 40 MG tablet Take 1 tablet (40 mg total) by mouth daily at 6 (six) AM. 06/06/22   Rodolph Bong, MD  rOPINIRole (REQUIP) 0.5 MG tablet Takes 4 tabs at 5 pm and 3 tabs at bedtime and 1-2 tabs prn 11/16/22   Corie Chiquito, PMHNP  Wheat Dextrin (BENEFIBER PO) Take 4-5 g by mouth daily as needed (for constipation- mix as directed).    [provider]      Allergies    Codeine; Procaine hcl; Epinephrine; Ozempic (0.25 or 0.5 mg-dose) [semaglutide(0.25 or 0.5mg -dos)]; Aspirin; Augmentin [amoxicillin-pot clavulanate]; Benadryl [diphenhydramine];  Diflucan [fluconazole]; Dilaudid [hydromorphone hcl]; Hydromorphone; Morphine; Other; Prednisone; Tramadol hcl; Antihistamines, loratadine-type; Macrobid [nitrofurantoin]; and Sulfa antibiotics    Review of Systems   Review of Systems  Physical Exam Updated Vital Signs BP (!) 152/65 (BP Location: Left Arm)   Pulse 87   Temp 98.3 F (36.8 C)   Resp 17   Ht 5' (1.524 m)   Wt 81.6 kg   SpO2 95%   BMI 35.15 kg/m  Physical Exam Vitals and nursing note reviewed.  Constitutional:      General: She is not in acute distress.    Appearance: She is well-developed.  HENT:     Head: Normocephalic and atraumatic.     Right Ear: External ear normal.     Left Ear: External ear normal.     Nose: Nose normal.  Eyes:     Conjunctiva/sclera: Conjunctivae normal.  Cardiovascular:     Rate and Rhythm: Normal rate and regular rhythm.     Heart sounds: No murmur heard. Pulmonary:     Effort: No respiratory distress.     Breath sounds: No wheezing, rhonchi or rales.  Abdominal:     Palpations: Abdomen is soft.     Tenderness: There is generalized abdominal tenderness and tenderness in the left upper quadrant. There is no guarding or rebound.     Comments:  Patient with generalized abdominal tenderness to palpation without rebound or guarding but winces when I press in the left upper quadrant and left lateral abdomen.  Musculoskeletal:     Cervical back: Normal range of motion and neck supple.     Right lower leg: No edema.     Left lower leg: No edema.  Skin:    General: Skin is warm and dry.     Findings: No rash.  Neurological:     General: No focal deficit present.     Mental Status: She is alert. Mental status is at baseline.     Motor: No weakness.  Psychiatric:        Mood and Affect: Mood normal.    ED Results / Procedures / Treatments   Labs (all labs ordered are listed, but only abnormal results are displayed) Labs Reviewed  COMPREHENSIVE METABOLIC PANEL - Abnormal; Notable for the following components:      Result Value   Potassium 5.2 (*)    Glucose, Bld 175 (*)    Creatinine, Ser 1.10 (*)    Calcium 10.9 (*)    GFR, Estimated 53 (*)    All other components within normal limits  URINALYSIS, ROUTINE W REFLEX MICROSCOPIC - Abnormal; Notable for the following components:   Color, Urine COLORLESS (*)    Ketones, ur TRACE (*)    All other components within normal limits  LIPASE, BLOOD  CBC    EKG None  Radiology CT ABDOMEN PELVIS W CONTRAST  Result Date: 01/17/2023 CLINICAL DATA:  Abdominal pain. EXAM: CT ABDOMEN AND PELVIS WITH CONTRAST TECHNIQUE: Multidetector CT imaging of the abdomen and pelvis was performed using the standard protocol following bolus administration of intravenous contrast. RADIATION DOSE REDUCTION: This exam was performed according to the departmental dose-optimization program which includes automated exposure control, adjustment of the mA and/or kV according to patient size and/or use of iterative reconstruction technique. CONTRAST:  OMNIPAQUE IOHEXOL 300 MG/ML  SOLN COMPARISON:  09/29/2022 FINDINGS: Lower chest: Unremarkable. Hepatobiliary: No suspicious focal abnormality within the liver  parenchyma. There is no evidence for gallstones, gallbladder wall thickening, or pericholecystic fluid. No  intrahepatic or extrahepatic biliary dilation. Pancreas: No focal mass lesion. No dilatation of the main duct. No intraparenchymal cyst. No peripancreatic edema. Spleen: No splenomegaly. No suspicious focal mass lesion. Adrenals/Urinary Tract: No adrenal nodule or mass. Cortical scarring noted right kidney. Central sinus cysts noted left kidney No evidence for hydroureter. The urinary bladder appears normal for the degree of distention. Stomach/Bowel: Stomach is unremarkable. No gastric wall thickening. No evidence of outlet obstruction. Duodenum is normally positioned as is the ligament of Treitz. No small bowel wall thickening. No small bowel dilatation. The terminal ileum is normal. The appendix is not well visualized, but there is no edema or inflammation in the region of the cecal tip to suggest appendicitis. No gross colonic mass. No colonic wall thickening. Diverticular changes are noted in the left colon without evidence of diverticulitis. Rectosigmoid anastomosis evident. Vascular/Lymphatic: There is mild atherosclerotic calcification of the abdominal aorta without aneurysm. There is no gastrohepatic or hepatoduodenal ligament lymphadenopathy. No retroperitoneal or mesenteric lymphadenopathy. No pelvic sidewall lymphadenopathy. Reproductive: There is no adnexal mass. Other: No intraperitoneal free fluid. Musculoskeletal: Tiny umbilical hernia contains only fat. Status post right hip replacement. No worrisome lytic or sclerotic osseous abnormality. IMPRESSION: 1. No acute findings in the abdomen or pelvis. Specifically, no findings to explain the patient's history of abdominal pain. 2. Left colonic diverticulosis without diverticulitis. 3. Tiny umbilical hernia contains only fat. 4.  Aortic Atherosclerosis (ICD10-I70.0). Electronically Signed   By: Kennith Center M.D.   On: 01/17/2023 13:23     Procedures Procedures    Medications Ordered in ED Medications  sodium chloride 0.9 % bolus 1,000 mL (0 mLs Intravenous Stopped 01/17/23 1355)  ondansetron (ZOFRAN) injection 4 mg (4 mg Intravenous Given 01/17/23 1208)  iohexol (OMNIPAQUE) 300 MG/ML solution 100 mL (100 mLs Intravenous Contrast Given 01/17/23 1238)    ED Course/ Medical Decision Making/ A&P    Patient seen and examined. History obtained directly from patient. Work-up including labs, imaging, EKG ordered in triage, if performed, were reviewed.    Labs/EKG: Independently reviewed and interpreted.  This included: CBC unremarkable with normal white blood cell count; CMP potassium elevated at 5.2, glucose 175, creatinine 1.1 with a BUN of 13; lipase normal.  UA pending.  Imaging: CT abdomen pelvis with contrast ordered  Medications/Fluids: Ordered: IV fluid bolus, IV Zofran.  Patient does not want any narcotics.  Most recent vital signs reviewed and are as follows: BP (!) 152/65 (BP Location: Left Arm)   Pulse 87   Temp 98.3 F (36.8 C)   Resp 17   Ht 5' (1.524 m)   Wt 81.6 kg   SpO2 95%   BMI 35.15 kg/m   Initial impression: Abdominal pain and distention, history of multiple abdominal surgeries.  Evaluate for diverticulitis, bowel obstruction or other etiology.  2:24 PM Reassessment performed. Patient appears comfortable, stable.  Labs personally reviewed and interpreted including: UA without compelling signs of infection.  Imaging personally visualized and interpreted including: CT abdomen pelvis, agree no acute findings.  Reviewed pertinent lab work and imaging with patient at bedside. Questions answered.   Most current vital signs reviewed and are as follows: BP (!) 131/58 (BP Location: Right Arm)   Pulse 65   Temp 98.3 F (36.8 C) (Oral)   Resp 16   Ht 5' (1.524 m)   Wt 81.6 kg   SpO2 92%   BMI 35.15 kg/m   Plan: Discharge to home.   Prescriptions written for: None, patient has Zofran at  home.  She is also prescribed chronic pain medication.  Other home care instructions discussed: Home meds, bland diet  ED return instructions discussed: The patient was urged to return to the Emergency Department immediately with worsening of current symptoms, worsening abdominal pain, persistent vomiting, blood noted in stools, fever, or any other concerns. The patient verbalized understanding.   Follow-up instructions discussed: Patient encouraged to follow-up with their PCP in 2 days.                                   Medical Decision Making Amount and/or Complexity of Data Reviewed Labs: ordered. Radiology: ordered.  Risk Prescription drug management.   For this patient's complaint of abdominal pain, the following conditions were considered on the differential diagnosis: gastritis/PUD, enteritis/duodenitis, appendicitis, cholelithiasis/cholecystitis, cholangitis, pancreatitis, ruptured viscus, colitis, diverticulitis, small/large bowel obstruction, proctitis, cystitis, pyelonephritis, ureteral colic, aortic dissection, aortic aneurysm. In women, ectopic pregnancy, pelvic inflammatory disease, ovarian cysts, and tubo-ovarian abscess were also considered. Atypical chest etiologies were also considered including ACS, PE, and pneumonia.  The patient's vital signs, pertinent lab work and imaging were reviewed and interpreted as discussed in the ED course. Hospitalization was considered for further testing, treatments, or serial exams/observation. However as patient is well-appearing, has a stable exam, and reassuring studies today, I do not feel that they warrant admission at this time. This plan was discussed with the patient who verbalizes agreement and comfort with this plan and seems reliable and able to return to the Emergency Department with worsening or changing symptoms.          Final Clinical Impression(s) / ED Diagnoses Final diagnoses:  Generalized abdominal pain    Rx  / DC Orders ED Discharge Orders     None         Renne Crigler, PA-C 01/17/23 1425    Rolan Bucco, MD 01/17/23 1435

## 2023-01-17 NOTE — ED Triage Notes (Signed)
Pt c/o nausea, abdominal pain in all quadrants, worsening on L side, worsening after eating.  Hx of diverticulitis and sigmoid colectomy with partial removal. Sees pain clinic for Oxycodone. Last taken at 0000.

## 2023-01-17 NOTE — ED Notes (Signed)
Pt discharged home and given discharge paperwork. Opportunities given for questions. Pt verbalizes understanding. PIV removed x1. Stone,Heather R , RN 

## 2023-01-17 NOTE — Discharge Instructions (Addendum)
Please read and follow all provided instructions.  Your diagnoses today include:  1. Generalized abdominal pain     Tests performed today include: Blood cell counts and platelets: Your infection fighting cell count was normal Kidney and liver function tests: Your potassium was slightly high Pancreas function test (called lipase) Urine test to look for infection: No signs of UTI CT scan today did not show any signs of diverticulitis or other problems in the abdomen Vital signs. See below for your results today.   Home care instructions:  Follow any educational materials contained in this packet.  Follow-up instructions: Please follow-up with your primary care provider in the next 2 days for further evaluation of your symptoms.    Return instructions:  SEEK IMMEDIATE MEDICAL ATTENTION IF: The pain does not go away or becomes severe  A temperature above 101F develops  Repeated vomiting occurs (multiple episodes)  The pain becomes localized to portions of the abdomen. The right side could possibly be appendicitis. In an adult, the left lower portion of the abdomen could be colitis or diverticulitis.  Blood is being passed in stools or vomit (bright red or black tarry stools)  You develop chest pain, difficulty breathing, dizziness or fainting, or become confused, poorly responsive, or inconsolable (young children) If you have any other emergent concerns regarding your health  Additional Information: Abdominal (belly) pain can be caused by many things. Your caregiver performed an examination and possibly ordered blood/urine tests and imaging (CT scan, x-rays, ultrasound). Many cases can be observed and treated at home after initial evaluation in the emergency department. Even though you are being discharged home, abdominal pain can be unpredictable. Therefore, you need a repeated exam if your pain does not resolve, returns, or worsens. Most patients with abdominal pain don't have to be  admitted to the hospital or have surgery, but serious problems like appendicitis and gallbladder attacks can start out as nonspecific pain. Many abdominal conditions cannot be diagnosed in one visit, so follow-up evaluations are very important.  Your vital signs today were: BP (!) 131/58 (BP Location: Right Arm)   Pulse 65   Temp 98.3 F (36.8 C) (Oral)   Resp 16   Ht 5' (1.524 m)   Wt 81.6 kg   SpO2 92%   BMI 35.15 kg/m  If your blood pressure (bp) was elevated above 135/85 this visit, please have this repeated by your doctor within one month. --------------

## 2023-01-18 DIAGNOSIS — R1084 Generalized abdominal pain: Secondary | ICD-10-CM | POA: Diagnosis not present

## 2023-01-19 ENCOUNTER — Ambulatory Visit (INDEPENDENT_AMBULATORY_CARE_PROVIDER_SITE_OTHER): Payer: Medicare Other | Admitting: Internal Medicine

## 2023-01-20 ENCOUNTER — Encounter: Payer: Self-pay | Admitting: Psychiatry

## 2023-01-22 DIAGNOSIS — T380X5A Adverse effect of glucocorticoids and synthetic analogues, initial encounter: Secondary | ICD-10-CM | POA: Diagnosis not present

## 2023-01-22 DIAGNOSIS — R29818 Other symptoms and signs involving the nervous system: Secondary | ICD-10-CM | POA: Diagnosis not present

## 2023-01-22 DIAGNOSIS — E66812 Obesity, class 2: Secondary | ICD-10-CM | POA: Diagnosis not present

## 2023-01-22 DIAGNOSIS — G894 Chronic pain syndrome: Secondary | ICD-10-CM | POA: Diagnosis not present

## 2023-01-22 DIAGNOSIS — M8580 Other specified disorders of bone density and structure, unspecified site: Secondary | ICD-10-CM | POA: Diagnosis not present

## 2023-01-22 DIAGNOSIS — M5442 Lumbago with sciatica, left side: Secondary | ICD-10-CM | POA: Diagnosis not present

## 2023-01-22 DIAGNOSIS — F3162 Bipolar disorder, current episode mixed, moderate: Secondary | ICD-10-CM | POA: Diagnosis not present

## 2023-01-22 DIAGNOSIS — M9953 Intervertebral disc stenosis of neural canal of lumbar region: Secondary | ICD-10-CM | POA: Diagnosis not present

## 2023-01-22 DIAGNOSIS — Z6837 Body mass index (BMI) 37.0-37.9, adult: Secondary | ICD-10-CM | POA: Diagnosis not present

## 2023-01-22 DIAGNOSIS — G8929 Other chronic pain: Secondary | ICD-10-CM | POA: Diagnosis not present

## 2023-01-26 ENCOUNTER — Other Ambulatory Visit: Payer: Self-pay | Admitting: Endocrinology

## 2023-01-26 DIAGNOSIS — E119 Type 2 diabetes mellitus without complications: Secondary | ICD-10-CM

## 2023-01-26 DIAGNOSIS — M25531 Pain in right wrist: Secondary | ICD-10-CM | POA: Diagnosis not present

## 2023-01-26 DIAGNOSIS — M25551 Pain in right hip: Secondary | ICD-10-CM | POA: Diagnosis not present

## 2023-01-26 DIAGNOSIS — M25562 Pain in left knee: Secondary | ICD-10-CM | POA: Diagnosis not present

## 2023-01-28 DIAGNOSIS — G894 Chronic pain syndrome: Secondary | ICD-10-CM | POA: Diagnosis not present

## 2023-01-28 DIAGNOSIS — M5459 Other low back pain: Secondary | ICD-10-CM | POA: Diagnosis not present

## 2023-01-28 DIAGNOSIS — Z79891 Long term (current) use of opiate analgesic: Secondary | ICD-10-CM | POA: Diagnosis not present

## 2023-01-28 DIAGNOSIS — Z5181 Encounter for therapeutic drug level monitoring: Secondary | ICD-10-CM | POA: Diagnosis not present

## 2023-01-28 DIAGNOSIS — M51362 Other intervertebral disc degeneration, lumbar region with discogenic back pain and lower extremity pain: Secondary | ICD-10-CM | POA: Diagnosis not present

## 2023-02-03 ENCOUNTER — Encounter: Payer: Self-pay | Admitting: Psychiatry

## 2023-02-03 ENCOUNTER — Ambulatory Visit (INDEPENDENT_AMBULATORY_CARE_PROVIDER_SITE_OTHER): Payer: Medicare Other | Admitting: Psychiatry

## 2023-02-03 ENCOUNTER — Telehealth: Payer: Self-pay | Admitting: Endocrinology

## 2023-02-03 ENCOUNTER — Other Ambulatory Visit: Payer: Self-pay

## 2023-02-03 DIAGNOSIS — F319 Bipolar disorder, unspecified: Secondary | ICD-10-CM | POA: Diagnosis not present

## 2023-02-03 DIAGNOSIS — G2581 Restless legs syndrome: Secondary | ICD-10-CM

## 2023-02-03 DIAGNOSIS — E1165 Type 2 diabetes mellitus with hyperglycemia: Secondary | ICD-10-CM

## 2023-02-03 DIAGNOSIS — F5101 Primary insomnia: Secondary | ICD-10-CM

## 2023-02-03 DIAGNOSIS — F411 Generalized anxiety disorder: Secondary | ICD-10-CM | POA: Diagnosis not present

## 2023-02-03 MED ORDER — DIVALPROEX SODIUM 250 MG PO DR TAB: DELAYED_RELEASE_TABLET | ORAL | 0 refills | Status: DC

## 2023-02-03 MED ORDER — BD PEN NEEDLE NANO 2ND GEN 32G X 4 MM MISC: 3 refills | Status: DC

## 2023-02-03 MED ORDER — ROPINIROLE HCL 0.5 MG PO TABS: ORAL_TABLET | ORAL | 0 refills | Status: DC
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
## 2023-02-03 NOTE — Telephone Encounter (Signed)
Based on last office visit she has been currently taking Lantus 76 unit daily.  I would recommend to decrease Lantus to 70 units daily due to low normal blood sugar in the morning fasting.

## 2023-02-03 NOTE — Telephone Encounter (Signed)
Patient is calling this morning to say that her blood sugar levels are running low.  Patient states that it has been running low for over a month- in the 70's and 80's.  Patient states that this morning around 7:00 AM it was 68.  Yesterday, 02/02/2023 at around 7:00 AM it was 84.  Pateint states that her device is connected to the office.  She would like to know what she should do before the holiday gets here.

## 2023-02-08 DIAGNOSIS — Z79891 Long term (current) use of opiate analgesic: Secondary | ICD-10-CM | POA: Diagnosis not present

## 2023-02-08 DIAGNOSIS — G894 Chronic pain syndrome: Secondary | ICD-10-CM | POA: Diagnosis not present

## 2023-02-08 DIAGNOSIS — Z5181 Encounter for therapeutic drug level monitoring: Secondary | ICD-10-CM | POA: Diagnosis not present

## 2023-02-08 DIAGNOSIS — M5459 Other low back pain: Secondary | ICD-10-CM | POA: Diagnosis not present

## 2023-02-09 ENCOUNTER — Other Ambulatory Visit: Payer: Self-pay

## 2023-02-09 DIAGNOSIS — E1165 Type 2 diabetes mellitus with hyperglycemia: Secondary | ICD-10-CM

## 2023-02-09 MED ORDER — FREESTYLE LIBRE 2 SENSOR MISC: 2 refills | Status: DC

## 2023-02-18 DIAGNOSIS — M5416 Radiculopathy, lumbar region: Secondary | ICD-10-CM | POA: Diagnosis not present

## 2023-02-22 DIAGNOSIS — M549 Dorsalgia, unspecified: Secondary | ICD-10-CM | POA: Diagnosis not present

## 2023-02-22 DIAGNOSIS — R5381 Other malaise: Secondary | ICD-10-CM | POA: Diagnosis not present

## 2023-02-22 DIAGNOSIS — R519 Headache, unspecified: Secondary | ICD-10-CM | POA: Diagnosis not present

## 2023-02-22 DIAGNOSIS — I10 Essential (primary) hypertension: Secondary | ICD-10-CM | POA: Diagnosis not present

## 2023-02-23 DIAGNOSIS — R1084 Generalized abdominal pain: Secondary | ICD-10-CM | POA: Diagnosis not present

## 2023-02-23 DIAGNOSIS — K5909 Other constipation: Secondary | ICD-10-CM | POA: Diagnosis not present

## 2023-02-23 DIAGNOSIS — R140 Abdominal distension (gaseous): Secondary | ICD-10-CM | POA: Diagnosis not present

## 2023-02-23 DIAGNOSIS — R1013 Epigastric pain: Secondary | ICD-10-CM | POA: Diagnosis not present

## 2023-02-25 ENCOUNTER — Ambulatory Visit: Payer: Medicare Other | Admitting: Endocrinology

## 2023-02-25 DIAGNOSIS — Z79891 Long term (current) use of opiate analgesic: Secondary | ICD-10-CM | POA: Diagnosis not present

## 2023-02-25 DIAGNOSIS — M5459 Other low back pain: Secondary | ICD-10-CM | POA: Diagnosis not present

## 2023-02-25 DIAGNOSIS — M51362 Other intervertebral disc degeneration, lumbar region with discogenic back pain and lower extremity pain: Secondary | ICD-10-CM | POA: Diagnosis not present

## 2023-02-25 DIAGNOSIS — G894 Chronic pain syndrome: Secondary | ICD-10-CM | POA: Diagnosis not present

## 2023-02-25 DIAGNOSIS — Z5181 Encounter for therapeutic drug level monitoring: Secondary | ICD-10-CM | POA: Diagnosis not present

## 2023-02-26 ENCOUNTER — Ambulatory Visit: Payer: Medicare Other | Admitting: Endocrinology

## 2023-03-01 ENCOUNTER — Encounter: Payer: Self-pay | Admitting: Psychiatry

## 2023-03-01 ENCOUNTER — Telehealth (INDEPENDENT_AMBULATORY_CARE_PROVIDER_SITE_OTHER): Payer: Medicare Other | Admitting: Psychiatry

## 2023-03-01 DIAGNOSIS — G2581 Restless legs syndrome: Secondary | ICD-10-CM

## 2023-03-01 DIAGNOSIS — F319 Bipolar disorder, unspecified: Secondary | ICD-10-CM | POA: Diagnosis not present

## 2023-03-01 DIAGNOSIS — F411 Generalized anxiety disorder: Secondary | ICD-10-CM | POA: Diagnosis not present

## 2023-03-01 DIAGNOSIS — F5101 Primary insomnia: Secondary | ICD-10-CM | POA: Diagnosis not present

## 2023-03-01 MED ORDER — DIVALPROEX SODIUM 250 MG PO DR TAB
500.0000 mg | DELAYED_RELEASE_TABLET | Freq: Every day | ORAL | 0 refills | Status: DC
Start: 2023-03-01 — End: 2023-05-04

## 2023-03-01 MED ORDER — ROPINIROLE HCL 0.5 MG PO TABS: ORAL_TABLET | ORAL | 0 refills | Status: DC

## 2023-03-01 MED ORDER — FLUOXETINE HCL 10 MG PO CAPS: 10.0000 mg | ORAL_CAPSULE | Freq: Every day | ORAL | 1 refills | Status: DC
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
## 2023-03-02 ENCOUNTER — Encounter (HOSPITAL_BASED_OUTPATIENT_CLINIC_OR_DEPARTMENT_OTHER): Payer: Self-pay

## 2023-03-02 ENCOUNTER — Emergency Department (HOSPITAL_BASED_OUTPATIENT_CLINIC_OR_DEPARTMENT_OTHER)
Admission: EM | Admit: 2023-03-02 | Discharge: 2023-03-02 | Disposition: A | Discharge status: Home / Self Care | Payer: Medicare Other | Attending: Emergency Medicine | Admitting: Emergency Medicine

## 2023-03-02 ENCOUNTER — Emergency Department (HOSPITAL_BASED_OUTPATIENT_CLINIC_OR_DEPARTMENT_OTHER): Payer: Medicare Other

## 2023-03-02 ENCOUNTER — Other Ambulatory Visit: Payer: Self-pay

## 2023-03-02 DIAGNOSIS — H538 Other visual disturbances: Secondary | ICD-10-CM | POA: Insufficient documentation

## 2023-03-02 DIAGNOSIS — Z794 Long term (current) use of insulin: Secondary | ICD-10-CM | POA: Insufficient documentation

## 2023-03-02 DIAGNOSIS — E119 Type 2 diabetes mellitus without complications: Secondary | ICD-10-CM | POA: Diagnosis not present

## 2023-03-02 DIAGNOSIS — R519 Headache, unspecified: Secondary | ICD-10-CM | POA: Insufficient documentation

## 2023-03-02 DIAGNOSIS — Z79899 Other long term (current) drug therapy: Secondary | ICD-10-CM | POA: Insufficient documentation

## 2023-03-02 DIAGNOSIS — K146 Glossodynia: Secondary | ICD-10-CM | POA: Diagnosis not present

## 2023-03-02 LAB — BASIC METABOLIC PANEL
Anion gap: 9 (ref 5–15)
BUN: 13 mg/dL (ref 8–23)
CO2: 25 mmol/L (ref 22–32)
Calcium: 10.3 mg/dL (ref 8.9–10.3)
Chloride: 105 mmol/L (ref 98–111)
Creatinine, Ser: 0.84 mg/dL (ref 0.44–1.00)
GFR, Estimated: >60 mL/min (ref >60)
Glucose, Bld: 124 mg/dL — ABNORMAL HIGH (ref 70–99)
Potassium: 4.4 mmol/L (ref 3.5–5.1)
Sodium: 139 mmol/L (ref 135–145)

## 2023-03-02 LAB — CBC WITH DIFFERENTIAL/PLATELET
Abs Immature Granulocytes: 0.04 10*3/uL (ref 0.00–0.07)
Basophils Absolute: 0 10*3/uL (ref 0.0–0.1)
Basophils Relative: 0 %
Eosinophils Absolute: 0.2 10*3/uL (ref 0.0–0.5)
Eosinophils Relative: 2 %
HCT: 45.1 % (ref 36.0–46.0)
Hemoglobin: 14.6 g/dL (ref 12.0–15.0)
Immature Granulocytes: 0 %
Lymphocytes Relative: 17 %
Lymphs Abs: 1.5 10*3/uL (ref 0.7–4.0)
MCH: 29.2 pg (ref 26.0–34.0)
MCHC: 32.4 g/dL (ref 30.0–36.0)
MCV: 90.2 fL (ref 80.0–100.0)
Monocytes Absolute: 0.4 10*3/uL (ref 0.1–1.0)
Monocytes Relative: 5 %
Neutro Abs: 6.7 10*3/uL (ref 1.7–7.7)
Neutrophils Relative %: 76 %
Platelets: 215 10*3/uL (ref 150–400)
RBC: 5 MIL/uL (ref 3.87–5.11)
RDW: 13.2 % (ref 11.5–15.5)
WBC: 8.9 10*3/uL (ref 4.0–10.5)
nRBC: 0 % (ref 0.0–0.2)

## 2023-03-02 LAB — MAGNESIUM: Magnesium: 1.8 mg/dL (ref 1.7–2.4)

## 2023-03-02 MED ORDER — PROCHLORPERAZINE EDISYLATE 10 MG/2ML IJ SOLN
10.0000 mg | Freq: Once | INTRAMUSCULAR | Status: AC
  Administered 2023-03-02: 10 mg via INTRAVENOUS
  Filled 2023-03-02: qty 2

## 2023-03-02 MED ORDER — DIPHENHYDRAMINE HCL 25 MG PO CAPS
25.0000 mg | ORAL_CAPSULE | Freq: Once | ORAL | Status: DC
  Filled 2023-03-02: qty 1

## 2023-03-02 MED ORDER — KETOROLAC TROMETHAMINE 15 MG/ML IJ SOLN
15.0000 mg | Freq: Once | INTRAMUSCULAR | Status: AC
  Administered 2023-03-02: 15 mg via INTRAVENOUS
  Filled 2023-03-02: qty 1

## 2023-03-02 NOTE — ED Notes (Signed)
 RN reviewed discharge instructions with pt. Pt verbalized understanding and had no further questions. VSS upon discharge.  

## 2023-03-02 NOTE — Discharge Instructions (Signed)
Thank you for letting us evaluate you today. We have resolved your headache with compazine and toradol. Please follow up with your PCP regarding BP management and if headaches reoccur  Return to ED if you experience worsening headache, changes in vision, weakness/numbness on one side of your body.

## 2023-03-02 NOTE — ED Provider Notes (Signed)
[place] EMERGENCY DEPARTMENT AT [facility] Provider Note   CSN: [id number] Arrival date & time: 03/02/23  1340     History  Chief Complaint  Patient presents with   Headache   Blurred Vision    [name] [name] is a 77 y.o. female with past medical history of HLD, type 2 diabetes, fibromyalgia, sciatica, bipolar 1, IBS presents to emergency department for evaluation of headache and blurred vision over the past 2 to 3 weeks.  She reports that these headaches wake her up in the middle of the night with associated photophobia, noise sensitivity, nausea.  She reports that headaches are normally "all over" and currently has a headache behind her left eye.  Patient's husband denies AMS, confusion, seizure-like activity. She denies blurred vision, fevers, neck pain.  Headache Associated symptoms: no abdominal pain, no cough, no diarrhea, no dizziness, no fatigue, no fever, no nausea, no numbness, no seizures, no vomiting and no weakness      Home Medications Prior to Admission medications   Medication Sig Start Date End Date Taking? Authorizing Provider  acetaminophen (TYLENOL) 500 MG tablet Take 1 tablet (500 mg total) by mouth every 6 (six) hours as needed. 11/15/22   Derwood Kaplan, MD  albuterol (VENTOLIN HFA) 108 (90 Base) MCG/ACT inhaler Inhale 1-2 puffs into the lungs every 6 (six) hours as needed for wheezing or shortness of breath.    [provider]  atorvastatin (LIPITOR) 40 MG tablet Take 1 tablet (40 mg total) by mouth See admin instructions. Take 40 mg by mouth at 5 PM 06/12/22   Rodolph Bong, MD  b complex vitamins capsule Take 1 capsule by mouth daily.    [provider]  bismuth subsalicylate (PEPTO BISMOL) 262 MG/15ML suspension Take 30 mLs by mouth every 6 (six) hours as needed.    [provider]  calcium carbonate (TUMS EX) 750 MG chewable tablet Chew 1 tablet by mouth as needed.    [provider]  cholecalciferol  (VITAMIN D3) 25 MCG (1000 UNIT) tablet Take 1,000-2,000 Units by mouth See admin instructions. Take 1,000 units by mouth in the morning and 2,000 units at 5 PM    [provider]  Continuous Glucose Sensor (FREESTYLE LIBRE 2 SENSOR) MISC Change every 2 weeks as directed by manufacturer 02/09/23   Thapa, [place], MD  CONTOUR NEXT TEST test strip USE TO TEST BLOOD SUGAR TWICE DAILY 08/01/21   Reather Littler, MD  cyclobenzaprine (FLEXERIL) 5 MG tablet Take 5-10 mg by mouth every 8 (eight) hours as needed. 10/30/22   [provider]  diclofenac (VOLTAREN) 75 MG EC tablet Take 75 mg by mouth as needed. 05/11/22   [provider]  dicyclomine (BENTYL) 20 MG tablet Take 1 tablet (20 mg total) by mouth 3 (three) times daily as needed for spasms. 06/17/21   Burnadette Pop, MD  divalproex (DEPAKOTE) 250 MG DR tablet Take 2 tablets (500 mg total) by mouth at bedtime. 03/01/23 05/30/23  Corie Chiquito, PMHNP  ezetimibe (ZETIA) 10 MG tablet Take 1 tablet (10 mg total) by mouth every evening. 06/12/22   Rodolph Bong, MD  FLUoxetine (PROZAC) 10 MG capsule Take 1 capsule (10 mg total) by mouth daily. 03/01/23   Corie Chiquito, PMHNP  fluticasone (FLONASE) 50 MCG/ACT nasal spray Place 1 spray into both nostrils 2 (two) times daily as needed for allergies or rhinitis.    [provider]  FUROSEMIDE PO Take by mouth as needed.    [provider]  gabapentin (NEURONTIN) 600 MG tablet Take 600 mg by mouth 2 (two) times daily. Take 600 mg by mouth in the morning, at 5 PM, and at bedtime 05/12/19   [provider]  ibuprofen (ADVIL) 600 MG tablet Take 1 tablet (600 mg total) by mouth 4 (four) times daily. Patient taking differently: Take 600 mg by mouth every 6 (six) hours as needed. 11/15/22   Derwood Kaplan, MD  insulin lispro (HUMALOG KWIKPEN) 100 UNIT/ML KwikPen Inject 14-18 Units into the skin 2 (two) times daily before a meal. 07/06/22   Reather Littler, MD  Insulin Pen Needle  (BD PEN NEEDLE NANO 2ND GEN) 32G X 4 MM MISC Use to Inject 5 times daily as directed 02/03/23   Thapa, [place], MD  Ipratropium-Albuterol (COMBIVENT RESPIMAT) 20-100 MCG/ACT AERS respimat Inhale 1 puff into the lungs in the morning, at noon, and at bedtime for 7 days, THEN 1 puff every 6 (six) hours as needed for wheezing. Patient not taking: Reported on 11/16/2022 06/05/22 07/12/22  Rodolph Bong, MD  LANTUS SOLOSTAR 100 UNIT/ML Solostar Pen ADMINISTER 76 UNITS UNDER THE SKIN AT BEDTIME 01/26/23   Thapa, [place], MD  levothyroxine (SYNTHROID) 75 MCG tablet Take 1 tablet (75 mcg total) by mouth daily before breakfast. 12/28/22   Thapa, [place], MD  lidocaine (XYLOCAINE) 2 % solution Use as directed 15 mLs in the mouth or throat as directed. 06/01/22   [provider]  liothyronine (CYTOMEL) 5 MCG tablet TAKE 1 TABLET BY MOUTH DAILY WITH LEVOTHYROXINE  TAKE BEFORE BREAKFAST 12/28/22   Thapa, [place], MD  loperamide (IMODIUM) 2 MG capsule Take 2 capsules (4 mg total) by mouth 3 (three) times daily as needed for diarrhea or loose stools. 11/12/21   Danford, Earl Lites, MD  loratadine (CLARITIN) 10 MG tablet Take 10 mg by mouth daily as needed for allergies.    [provider]  losartan (COZAAR) 50 MG tablet Take 50 mg by mouth See admin instructions. 08/31/19   [provider]  methocarbamol (ROBAXIN) 750 MG tablet Take 1 tablet (750 mg total) by mouth 3 (three) times daily as needed (muscle spasm/pain). 10/22/22   Cathren Laine, MD  Microlet Lancets MISC TEST DAILY AS DIRECTED 08/01/21   Reather Littler, MD  nitroGLYCERIN (NITROSTAT) 0.4 MG SL tablet Place 1 tablet (0.4 mg total) under the tongue every 5 (five) minutes as needed for chest pain. 09/14/17   Robbie Lis M, PA-C  ondansetron (ZOFRAN) 4 MG tablet Take 1 tablet (4 mg total) by mouth every 6 (six) hours. Patient taking differently: Take 4 mg by mouth every 6 (six) hours as needed for nausea or vomiting. 09/20/21   Curatolo,  Adam, DO  oxyCODONE-acetaminophen (PERCOCET) 7.5-325 MG tablet Take 1 tablet by mouth every 8 (eight) hours as needed. 02/25/23   [provider]  pantoprazole (PROTONIX) 40 MG tablet Take 1 tablet (40 mg total) by mouth daily at 6 (six) AM. 06/06/22   Rodolph Bong, MD  rOPINIRole (REQUIP) 0.5 MG tablet Takes 4 tabs at 5 pm and 3 tabs at bedtime and 1-2 tabs prn 03/01/23   Corie Chiquito, PMHNP  Wheat Dextrin (BENEFIBER PO) Take 4-5 g by mouth daily as needed (for constipation- mix as directed).    [provider]      Allergies    Codeine; Procaine hcl; Epinephrine; Ozempic (0.25 or 0.5 mg-dose) [semaglutide(0.25 or 0.5mg -dos)]; Aspirin; Augmentin [amoxicillin-pot clavulanate]; Benadryl [diphenhydramine]; Diflucan [fluconazole]; Dilaudid [hydromorphone hcl]; Hydromorphone; Hydroxyzine; Morphine; Other;  Prednisone; Tramadol hcl; Antihistamines, loratadine-type; Macrobid [nitrofurantoin]; and Sulfa antibiotics    Review of Systems   Review of Systems  Constitutional:  Negative for chills, fatigue and fever.  Respiratory:  Negative for cough, chest tightness, shortness of breath and wheezing.   Cardiovascular:  Negative for chest pain and palpitations.  Gastrointestinal:  Negative for abdominal pain, constipation, diarrhea, nausea and vomiting.  Neurological:  Positive for headaches. Negative for dizziness, seizures, weakness, light-headedness and numbness.    Physical Exam Updated Vital Signs BP (!) 167/87   Pulse 71   Temp 98 F (36.7 C) (Oral)   Resp 18   Ht 5' (1.524 m)   Wt 82 kg   SpO2 94%   BMI 35.31 kg/m  Physical Exam Vitals and nursing note reviewed.  Constitutional:      General: She is not in acute distress.    Appearance: Normal appearance. She is not diaphoretic.  HENT:     Head: Normocephalic and atraumatic.  Eyes:     General: No visual field deficit or scleral icterus.    Extraocular Movements: Extraocular movements intact.     Right  eye: Normal extraocular motion and no nystagmus.     Left eye: Normal extraocular motion and no nystagmus.     Conjunctiva/sclera: Conjunctivae normal.     Pupils: Pupils are equal, round, and reactive to light. Pupils are equal.  Cardiovascular:     Rate and Rhythm: Normal rate.  Pulmonary:     Effort: Pulmonary effort is normal. No respiratory distress.     Breath sounds: Normal breath sounds.  Musculoskeletal:     Right lower leg: No edema.     Left lower leg: No edema.  Skin:    General: Skin is warm.     Capillary Refill: Capillary refill takes less than 2 seconds.     Coloration: Skin is not jaundiced or pale.  Neurological:     Mental Status: She is alert and oriented to person, place, and time. Mental status is at baseline.     GCS: GCS eye subscore is 4. GCS verbal subscore is 5. GCS motor subscore is 6.     Cranial Nerves: No cranial nerve deficit, dysarthria or facial asymmetry.     Sensory: No sensory deficit.     Motor: No weakness.     Coordination: Coordination normal.     Gait: Gait normal.     Deep Tendon Reflexes: Reflexes normal.    Dr. Rhunette Croft individually assessed patient and agrees with treatment plan ED Results / Procedures / Treatments   Labs (all labs ordered are listed, but only abnormal results are displayed) Labs Reviewed  BASIC METABOLIC PANEL - Abnormal; Notable for the following components:      Result Value   Glucose, Bld 124 (*)    All other components within normal limits  CBC WITH DIFFERENTIAL/PLATELET  MAGNESIUM    EKG None  Radiology CT Head Wo Contrast Result Date: 03/02/2023 CLINICAL DATA:  Headache, new onset (Age >= 51y). Associated nausea and blurred vision. EXAM: CT HEAD WITHOUT CONTRAST TECHNIQUE: Contiguous axial images were obtained from the base of the skull through the vertex without intravenous contrast. RADIATION DOSE REDUCTION: This exam was performed according to the departmental dose-optimization program which  includes automated exposure control, adjustment of the mA and/or kV according to patient size and/or use of iterative reconstruction technique. COMPARISON:  Head CT 01/08/2023 FINDINGS: Brain: There is no evidence of an acute infarct, intracranial hemorrhage, mass, midline shift,  or extra-axial fluid collection. The ventricles and sulci are normal. Vascular: No hyperdense vessel. Skull: No acute fracture or suspicious osseous lesion. Sinuses/Orbits: Visualized paranasal sinuses and mastoid air cells are clear. Unremarkable orbits. Other: None. IMPRESSION: Negative head CT. Electronically Signed   By: Sebastian Ache M.D.   On: 03/02/2023 16:08    Procedures Procedures    Medications Ordered in ED Medications  prochlorperazine (COMPAZINE) injection 10 mg (10 mg Intravenous Given 03/02/23 1508)  ketorolac (TORADOL) 15 MG/ML injection 15 mg (15 mg Intravenous Given 03/02/23 1508)    ED Course/ Medical Decision Making/ A&P Clinical Course as of 03/02/23 2306  Tue Mar 02, 2023  1703 BP(!): 167/87 Increased losartan from 25 to 50mg  on 02/27/23 and has not taken it today [LB]    Clinical Course User Index [LB] Judithann Sheen, PA                                 Medical Decision Making Amount and/or Complexity of Data Reviewed Labs: ordered. Radiology: ordered.  Risk Prescription drug management.   Patient presents to the ED for concern of HA, this involves an extensive number of treatment options, and is a complaint that carries with it a high risk of complications and morbidity.  The differential diagnosis includes migraine, HTN crisis, ICH. Not an exhaustive list   Co morbidities that complicate the patient evaluation  HLD, type 2 diabetes, fibromyalgia, sciatica, bipolar 1, IBS   Additional history obtained:  Additional history obtained from Family, Nursing, and Outside Medical Records   External records from outside source obtained and reviewed including  Information from  spouse Triage RN note Recent medical evaluations and medication list   Lab Tests:  I Ordered, and personally interpreted labs.  The pertinent results include:   Glucose 124 (baseline 99-175 over 6 months)   Imaging Studies ordered:  I ordered imaging studies including CT head WO contrast  I independently visualized and interpreted imaging which showed negative CT I agree with the radiologist interpretation    Medicines ordered and prescription drug management:  I ordered medication including compazine, benadryl, toradol  for migraine  Reevaluation of the patient after these medicines showed that the patient improved I have reviewed the patients home medicines and have made adjustments as needed    Problem List / ED Course:  Non intractable migraine PE without neuro deficit. CT negative for acute abnormality. HA completely resolved with migraine cocktail Patient requests to follow up with PCP regarding HA Discussed return to emergency department cautions with patient who expresses understanding agrees with plan.  All questions answered to her satisfaction.  Patient is satisfied with discharge at this time.   Reevaluation:  After the interventions noted above, I reevaluated the patient and found that they have :improved   Social Determinants of Health:  Has PCP f/u   Dispostion:  After consideration of the diagnostic results and the patients response to treatment, I feel that the patent would benefit from outpatient management.   Final Clinical Impression(s) / ED Diagnoses Final diagnoses:  Acute nonintractable headache, unspecified headache type    Rx / DC Orders ED Discharge Orders     None         Judithann Sheen, PA 03/02/23 2308    Derwood Kaplan, MD 03/03/23 [phone]

## 2023-03-02 NOTE — ED Triage Notes (Signed)
Patient arrives ambulatory to ED with complaints of ongoing headaches x2 weeks with nausea & blurred vision. Patient was sent here by Urgent Care for further evaluation scans.  Rates her pain a 5/10.

## 2023-03-04 DIAGNOSIS — R519 Headache, unspecified: Secondary | ICD-10-CM | POA: Diagnosis not present

## 2023-03-09 ENCOUNTER — Encounter: Payer: Self-pay | Admitting: Endocrinology

## 2023-03-09 ENCOUNTER — Ambulatory Visit: Payer: Medicare Other | Admitting: Endocrinology

## 2023-03-09 VITALS — BP 102/62 | HR 80 | Resp 20 | Ht 60.0 in | Wt 176.2 lb

## 2023-03-09 DIAGNOSIS — Z794 Long term (current) use of insulin: Secondary | ICD-10-CM

## 2023-03-09 DIAGNOSIS — E042 Nontoxic multinodular goiter: Secondary | ICD-10-CM | POA: Diagnosis not present

## 2023-03-09 DIAGNOSIS — E782 Mixed hyperlipidemia: Secondary | ICD-10-CM

## 2023-03-09 DIAGNOSIS — E1165 Type 2 diabetes mellitus with hyperglycemia: Secondary | ICD-10-CM | POA: Diagnosis not present

## 2023-03-09 DIAGNOSIS — E118 Type 2 diabetes mellitus with unspecified complications: Secondary | ICD-10-CM

## 2023-03-09 DIAGNOSIS — E039 Hypothyroidism, unspecified: Secondary | ICD-10-CM | POA: Diagnosis not present

## 2023-03-09 LAB — POCT GLYCOSYLATED HEMOGLOBIN (HGB A1C): Hemoglobin A1C: 6.3 % — AB (ref 4.0–5.6)
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
## 2023-03-09 NOTE — Patient Instructions (Signed)
Lantus 70 units daily. If glucose 70 - 90 range in the morning fating, decrease to 65 units daily.   Use humalog as needed.

## 2023-03-10 LAB — T4, FREE: Free T4: 1.4 ng/dL (ref 0.8–1.8)

## 2023-03-10 LAB — TSH: TSH: 0.35 m[IU]/L — ABNORMAL LOW (ref 0.40–4.50)

## 2023-03-10 LAB — T3, FREE: T3, Free: 3.6 pg/mL (ref 2.3–4.2)

## 2023-03-11 DIAGNOSIS — F319 Bipolar disorder, unspecified: Secondary | ICD-10-CM | POA: Diagnosis not present

## 2023-03-13 ENCOUNTER — Encounter: Payer: Self-pay | Admitting: Endocrinology

## 2023-03-15 DIAGNOSIS — G43909 Migraine, unspecified, not intractable, without status migrainosus: Secondary | ICD-10-CM | POA: Diagnosis not present

## 2023-03-15 DIAGNOSIS — F319 Bipolar disorder, unspecified: Secondary | ICD-10-CM | POA: Diagnosis not present

## 2023-03-15 DIAGNOSIS — R109 Unspecified abdominal pain: Secondary | ICD-10-CM | POA: Diagnosis not present

## 2023-03-15 DIAGNOSIS — E119 Type 2 diabetes mellitus without complications: Secondary | ICD-10-CM | POA: Diagnosis not present

## 2023-03-16 DIAGNOSIS — R109 Unspecified abdominal pain: Secondary | ICD-10-CM | POA: Diagnosis not present

## 2023-03-16 DIAGNOSIS — G43909 Migraine, unspecified, not intractable, without status migrainosus: Secondary | ICD-10-CM | POA: Diagnosis not present

## 2023-03-16 NOTE — Telephone Encounter (Signed)
 Generally treatment with levothyroxine  and liothyronine , especially related with liothyronine , having mildly low TSH is not unusual.  Mildly low TSH is actually related with relatively higher dose of thyroid  medication.  You had normal thyroid  function test results in the past on current dose.  Your symptoms mainly the feeling hot may be related with low TSH.  If you continue to have the symptoms we may need to decrease the dose of your thyroid  medication, please let us  know.  At this time I would like to continue the current dose of both levothyroxine  and liothyronine .

## 2023-03-24 ENCOUNTER — Other Ambulatory Visit: Payer: Self-pay | Admitting: Gastroenterology

## 2023-03-24 DIAGNOSIS — M5459 Other low back pain: Secondary | ICD-10-CM | POA: Diagnosis not present

## 2023-03-24 DIAGNOSIS — R1084 Generalized abdominal pain: Secondary | ICD-10-CM

## 2023-03-24 DIAGNOSIS — Z79891 Long term (current) use of opiate analgesic: Secondary | ICD-10-CM | POA: Diagnosis not present

## 2023-03-24 DIAGNOSIS — R1030 Lower abdominal pain, unspecified: Secondary | ICD-10-CM

## 2023-03-24 DIAGNOSIS — G894 Chronic pain syndrome: Secondary | ICD-10-CM | POA: Diagnosis not present

## 2023-03-24 DIAGNOSIS — Z5181 Encounter for therapeutic drug level monitoring: Secondary | ICD-10-CM | POA: Diagnosis not present

## 2023-03-25 DIAGNOSIS — G894 Chronic pain syndrome: Secondary | ICD-10-CM | POA: Diagnosis not present

## 2023-03-25 DIAGNOSIS — M5459 Other low back pain: Secondary | ICD-10-CM | POA: Diagnosis not present

## 2023-03-25 DIAGNOSIS — Z5181 Encounter for therapeutic drug level monitoring: Secondary | ICD-10-CM | POA: Diagnosis not present

## 2023-03-25 DIAGNOSIS — G629 Polyneuropathy, unspecified: Secondary | ICD-10-CM | POA: Diagnosis not present

## 2023-03-25 DIAGNOSIS — Z79891 Long term (current) use of opiate analgesic: Secondary | ICD-10-CM | POA: Diagnosis not present

## 2023-03-30 ENCOUNTER — Other Ambulatory Visit (HOSPITAL_COMMUNITY): Payer: Self-pay | Admitting: Orthopedic Surgery

## 2023-03-30 DIAGNOSIS — M25531 Pain in right wrist: Secondary | ICD-10-CM | POA: Diagnosis not present

## 2023-03-30 DIAGNOSIS — G4700 Insomnia, unspecified: Secondary | ICD-10-CM | POA: Diagnosis not present

## 2023-03-30 DIAGNOSIS — F319 Bipolar disorder, unspecified: Secondary | ICD-10-CM | POA: Diagnosis not present

## 2023-03-30 DIAGNOSIS — M79662 Pain in left lower leg: Secondary | ICD-10-CM | POA: Diagnosis not present

## 2023-03-30 DIAGNOSIS — G4733 Obstructive sleep apnea (adult) (pediatric): Secondary | ICD-10-CM | POA: Diagnosis not present

## 2023-03-30 DIAGNOSIS — M5442 Lumbago with sciatica, left side: Secondary | ICD-10-CM | POA: Diagnosis not present

## 2023-03-30 DIAGNOSIS — M79605 Pain in left leg: Secondary | ICD-10-CM

## 2023-03-31 ENCOUNTER — Ambulatory Visit (HOSPITAL_COMMUNITY)
Admission: RE | Admit: 2023-03-31 | Discharge: 2023-03-31 | Disposition: A | Discharge status: Home / Self Care | Payer: Medicare Other | Source: Ambulatory Visit | Attending: Family Medicine | Admitting: Family Medicine

## 2023-03-31 ENCOUNTER — Ambulatory Visit
Admission: RE | Admit: 2023-03-31 | Discharge: 2023-03-31 | Disposition: A | Discharge status: Home / Self Care | Payer: Medicare Other | Source: Ambulatory Visit | Attending: Gastroenterology | Admitting: Gastroenterology

## 2023-03-31 DIAGNOSIS — R1084 Generalized abdominal pain: Secondary | ICD-10-CM

## 2023-03-31 DIAGNOSIS — R109 Unspecified abdominal pain: Secondary | ICD-10-CM | POA: Diagnosis not present

## 2023-03-31 DIAGNOSIS — M79605 Pain in left leg: Secondary | ICD-10-CM | POA: Diagnosis not present

## 2023-03-31 DIAGNOSIS — R1030 Lower abdominal pain, unspecified: Secondary | ICD-10-CM

## 2023-03-31 MED ORDER — IOPAMIDOL (ISOVUE-300) INJECTION 61%
100.0000 mL | Freq: Once | INTRAVENOUS | Status: AC | PRN
Start: 2023-03-31 — End: 2023-03-31
  Administered 2023-03-31: 100 mL via INTRAVENOUS

## 2023-03-31 NOTE — Progress Notes (Signed)
Left lower extremity venous duplex has been completed. Preliminary results can be found in CV Proc through chart review.  Results were given to Daija at Dr. Wadie Lessen office.  03/31/23 1:51 PM Olen Cordial RVT

## 2023-04-01 NOTE — Telephone Encounter (Signed)
You have hypothyroidism but we do not know if you have Hashimoto disease to cause hypothyroidism.

## 2023-04-02 ENCOUNTER — Emergency Department (HOSPITAL_BASED_OUTPATIENT_CLINIC_OR_DEPARTMENT_OTHER)
Admission: EM | Admit: 2023-04-02 | Discharge: 2023-04-02 | Disposition: A | Discharge status: Home / Self Care | Payer: Medicare Other | Attending: Emergency Medicine | Admitting: Emergency Medicine

## 2023-04-02 ENCOUNTER — Encounter (HOSPITAL_BASED_OUTPATIENT_CLINIC_OR_DEPARTMENT_OTHER): Payer: Self-pay | Admitting: Emergency Medicine

## 2023-04-02 ENCOUNTER — Other Ambulatory Visit: Payer: Self-pay

## 2023-04-02 ENCOUNTER — Emergency Department (HOSPITAL_BASED_OUTPATIENT_CLINIC_OR_DEPARTMENT_OTHER): Payer: Medicare Other

## 2023-04-02 DIAGNOSIS — R1032 Left lower quadrant pain: Secondary | ICD-10-CM | POA: Diagnosis not present

## 2023-04-02 DIAGNOSIS — R110 Nausea: Secondary | ICD-10-CM | POA: Insufficient documentation

## 2023-04-02 DIAGNOSIS — Z7985 Long-term (current) use of injectable non-insulin antidiabetic drugs: Secondary | ICD-10-CM | POA: Diagnosis not present

## 2023-04-02 DIAGNOSIS — R0789 Other chest pain: Secondary | ICD-10-CM | POA: Diagnosis not present

## 2023-04-02 DIAGNOSIS — E119 Type 2 diabetes mellitus without complications: Secondary | ICD-10-CM | POA: Insufficient documentation

## 2023-04-02 DIAGNOSIS — R079 Chest pain, unspecified: Secondary | ICD-10-CM

## 2023-04-02 DIAGNOSIS — I10 Essential (primary) hypertension: Secondary | ICD-10-CM | POA: Diagnosis not present

## 2023-04-02 DIAGNOSIS — R9389 Abnormal findings on diagnostic imaging of other specified body structures: Secondary | ICD-10-CM | POA: Diagnosis not present

## 2023-04-02 LAB — URINALYSIS, ROUTINE W REFLEX MICROSCOPIC
Bilirubin Urine: NEGATIVE
Glucose, UA: NEGATIVE mg/dL
Hgb urine dipstick: NEGATIVE
Ketones, ur: NEGATIVE mg/dL
Leukocytes,Ua: NEGATIVE
Nitrite: NEGATIVE
Protein, ur: NEGATIVE mg/dL
Specific Gravity, Urine: 1.012 (ref 1.005–1.030)
pH: 5.5 (ref 5.0–8.0)

## 2023-04-02 LAB — COMPREHENSIVE METABOLIC PANEL
ALT: 12 U/L (ref 0–44)
AST: 15 U/L (ref 15–41)
Albumin: 4.5 g/dL (ref 3.5–5.0)
Alkaline Phosphatase: 85 U/L (ref 38–126)
Anion gap: 9 (ref 5–15)
BUN: 15 mg/dL (ref 8–23)
CO2: 27 mmol/L (ref 22–32)
Calcium: 10.5 mg/dL — ABNORMAL HIGH (ref 8.9–10.3)
Chloride: 103 mmol/L (ref 98–111)
Creatinine, Ser: 1.21 mg/dL — ABNORMAL HIGH (ref 0.44–1.00)
GFR, Estimated: 47 mL/min — ABNORMAL LOW (ref >60)
Glucose, Bld: 103 mg/dL — ABNORMAL HIGH (ref 70–99)
Potassium: 4.2 mmol/L (ref 3.5–5.1)
Sodium: 139 mmol/L (ref 135–145)
Total Bilirubin: 0.5 mg/dL (ref 0.0–1.2)
Total Protein: 6.6 g/dL (ref 6.5–8.1)

## 2023-04-02 LAB — CBC
HCT: 46.2 % — ABNORMAL HIGH (ref 36.0–46.0)
Hemoglobin: 15 g/dL (ref 12.0–15.0)
MCH: 29.2 pg (ref 26.0–34.0)
MCHC: 32.5 g/dL (ref 30.0–36.0)
MCV: 89.9 fL (ref 80.0–100.0)
Platelets: 217 10*3/uL (ref 150–400)
RBC: 5.14 MIL/uL — ABNORMAL HIGH (ref 3.87–5.11)
RDW: 13 % (ref 11.5–15.5)
WBC: 10 10*3/uL (ref 4.0–10.5)
nRBC: 0 % (ref 0.0–0.2)

## 2023-04-02 LAB — LIPASE, BLOOD: Lipase: 23 U/L (ref 11–51)

## 2023-04-02 LAB — TROPONIN I (HIGH SENSITIVITY)
Troponin I (High Sensitivity): 5 ng/L (ref <18)
Troponin I (High Sensitivity): 5 ng/L (ref <18)

## 2023-04-02 LAB — D-DIMER, QUANTITATIVE: D-Dimer, Quant: 0.28 ug{FEU}/mL (ref 0.00–0.50)

## 2023-04-02 MED ORDER — FENTANYL CITRATE PF 50 MCG/ML IJ SOSY
50.0000 ug | PREFILLED_SYRINGE | Freq: Once | INTRAMUSCULAR | Status: AC
  Administered 2023-04-02: 50 ug via INTRAVENOUS
  Filled 2023-04-02: qty 1

## 2023-04-02 MED ORDER — ONDANSETRON HCL 4 MG/2ML IJ SOLN
4.0000 mg | Freq: Once | INTRAMUSCULAR | Status: AC
  Administered 2023-04-02: 4 mg via INTRAVENOUS
  Filled 2023-04-02: qty 2

## 2023-04-02 NOTE — ED Provider Notes (Signed)
[place] EMERGENCY DEPARTMENT AT [facility] Provider Note   CSN: [id number] Arrival date & time: 04/02/23  1027     History  Chief Complaint  Patient presents with   Chest Pain    [name] [name] is a 78 y.o. female.   Chest Pain     Patient has history of multiple medical problems including colitis alcohol use disorder pneumonia fibromyalgia, chronic back pain, irritable bowel syndrome, bipolar disorder, diverticulosis, hyperlipidemia, hypertension, diabetes, arthritis, gastroparesis, chronic fatigue syndrome.  Patient states she came to the emergency room today because of chest pain.  Patient states she started having the symptoms about 5 days ago.  She is having central chest pressure that radiates to her left arm.  She has also had some nausea with it.  Patient states she was having some legs pain recently.  She had a Doppler ultrasound performed on January 22.  There is no evidence of DVT in her left lower extremity.  Patient also has been having issues with lower abdominal pain.  This has been ongoing for an extensive period of time.  She saw her GI doctor recently and had abdominal CT scan performed on the 22nd.  Patient states she continues to have pain in her left abdomen.  The CT scan has not been read yet.  She spoke with her GI doctor who suggested she come to the emergency room as it can take a couple of weeks for the CT scan to be read Home Medications Prior to Admission medications   Medication Sig Start Date End Date Taking? Authorizing Provider  acetaminophen (TYLENOL) 500 MG tablet Take 1 tablet (500 mg total) by mouth every 6 (six) hours as needed. 11/15/22   Derwood Kaplan, MD  albuterol (VENTOLIN HFA) 108 (90 Base) MCG/ACT inhaler Inhale 1-2 puffs into the lungs every 6 (six) hours as needed for wheezing or shortness of breath.    [provider]  atorvastatin (LIPITOR) 40 MG tablet Take 1 tablet (40 mg total) by mouth See admin instructions.  Take 40 mg by mouth at 5 PM 06/12/22   Rodolph Bong, MD  b complex vitamins capsule Take 1 capsule by mouth daily.    [provider]  bismuth subsalicylate (PEPTO BISMOL) 262 MG/15ML suspension Take 30 mLs by mouth every 6 (six) hours as needed.    [provider]  calcium carbonate (TUMS EX) 750 MG chewable tablet Chew 1 tablet by mouth as needed.    [provider]  cholecalciferol (VITAMIN D3) 25 MCG (1000 UNIT) tablet Take 1,000-2,000 Units by mouth See admin instructions. Take 1,000 units by mouth in the morning and 2,000 units at 5 PM    [provider]  Continuous Glucose Sensor (FREESTYLE LIBRE 2 SENSOR) MISC Change every 2 weeks as directed by manufacturer 02/09/23   Thapa, [place], MD  CONTOUR NEXT TEST test strip USE TO TEST BLOOD SUGAR TWICE DAILY 08/01/21   Reather Littler, MD  cyclobenzaprine (FLEXERIL) 5 MG tablet Take 5-10 mg by mouth every 8 (eight) hours as needed. 10/30/22   [provider]  diclofenac (VOLTAREN) 75 MG EC tablet Take 75 mg by mouth as needed. 05/11/22   [provider]  dicyclomine (BENTYL) 20 MG tablet Take 1 tablet (20 mg total) by mouth 3 (three) times daily as needed for spasms. 06/17/21   Burnadette Pop, MD  divalproex (DEPAKOTE) 250 MG DR tablet Take 2 tablets (500 mg total) by mouth at bedtime. 03/01/23 05/30/23  Corie Chiquito,  PMHNP  ezetimibe (ZETIA) 10 MG tablet Take 1 tablet (10 mg total) by mouth every evening. 06/12/22   Rodolph Bong, MD  FLUoxetine (PROZAC) 10 MG capsule Take 1 capsule (10 mg total) by mouth daily. 03/01/23   Corie Chiquito, PMHNP  fluticasone (FLONASE) 50 MCG/ACT nasal spray Place 1 spray into both nostrils 2 (two) times daily as needed for allergies or rhinitis.    [provider]  FUROSEMIDE PO Take by mouth as needed.    [provider]  gabapentin (NEURONTIN) 600 MG tablet Take 600 mg by mouth 2 (two) times daily. Take 600 mg by mouth in the morning, at 5 PM,  and at bedtime 05/12/19   [provider]  ibuprofen (ADVIL) 600 MG tablet Take 1 tablet (600 mg total) by mouth 4 (four) times daily. Patient taking differently: Take 600 mg by mouth every 6 (six) hours as needed. 11/15/22   Derwood Kaplan, MD  insulin lispro (HUMALOG KWIKPEN) 100 UNIT/ML KwikPen Inject 14-18 Units into the skin 2 (two) times daily before a meal. 07/06/22   Reather Littler, MD  Insulin Pen Needle (BD PEN NEEDLE NANO 2ND GEN) 32G X 4 MM MISC Use to Inject 5 times daily as directed 02/03/23   Thapa, [place], MD  Ipratropium-Albuterol (COMBIVENT RESPIMAT) 20-100 MCG/ACT AERS respimat Inhale 1 puff into the lungs in the morning, at noon, and at bedtime for 7 days, THEN 1 puff every 6 (six) hours as needed for wheezing. Patient not taking: Reported on 11/16/2022 06/05/22 07/12/22  Rodolph Bong, MD  LANTUS SOLOSTAR 100 UNIT/ML Solostar Pen ADMINISTER 76 UNITS UNDER THE SKIN AT BEDTIME 01/26/23   Thapa, [place], MD  levothyroxine (SYNTHROID) 75 MCG tablet Take 1 tablet (75 mcg total) by mouth daily before breakfast. 12/28/22   Thapa, [place], MD  lidocaine (XYLOCAINE) 2 % solution Use as directed 15 mLs in the mouth or throat as directed. 06/01/22   [provider]  liothyronine (CYTOMEL) 5 MCG tablet TAKE 1 TABLET BY MOUTH DAILY WITH LEVOTHYROXINE  TAKE BEFORE BREAKFAST 12/28/22   Thapa, [place], MD  loperamide (IMODIUM) 2 MG capsule Take 2 capsules (4 mg total) by mouth 3 (three) times daily as needed for diarrhea or loose stools. 11/12/21   Danford, Earl Lites, MD  loratadine (CLARITIN) 10 MG tablet Take 10 mg by mouth daily as needed for allergies.    [provider]  losartan (COZAAR) 50 MG tablet Take 50 mg by mouth See admin instructions. 08/31/19   [provider]  methocarbamol (ROBAXIN) 750 MG tablet Take 1 tablet (750 mg total) by mouth 3 (three) times daily as needed (muscle spasm/pain). 10/22/22   Cathren Laine, MD  Microlet Lancets MISC TEST DAILY AS  DIRECTED 08/01/21   Reather Littler, MD  nitroGLYCERIN (NITROSTAT) 0.4 MG SL tablet Place 1 tablet (0.4 mg total) under the tongue every 5 (five) minutes as needed for chest pain. 09/14/17   Robbie Lis M, PA-C  ondansetron (ZOFRAN) 4 MG tablet Take 1 tablet (4 mg total) by mouth every 6 (six) hours. Patient taking differently: Take 4 mg by mouth every 6 (six) hours as needed for nausea or vomiting. 09/20/21   Curatolo, Adam, DO  oxyCODONE-acetaminophen (PERCOCET) 7.5-325 MG tablet Take 1 tablet by mouth every 8 (eight) hours as needed. 02/25/23   [provider]  pantoprazole (PROTONIX) 40 MG tablet Take 1 tablet (40 mg total) by mouth daily at 6 (six) AM. 06/06/22   Rodolph Bong, MD  rOPINIRole (REQUIP) 0.5 MG tablet Takes 4 tabs at 5 pm and 3 tabs at bedtime and 1-2 tabs prn 03/01/23   Corie Chiquito, PMHNP  Wheat Dextrin (BENEFIBER PO) Take 4-5 g by mouth daily as needed (for constipation- mix as directed).    [provider]      Allergies    Codeine; Procaine hcl; Epinephrine; Ozempic (0.25 or 0.5 mg-dose) [semaglutide(0.25 or 0.5mg -dos)]; Aspirin; Augmentin [amoxicillin-pot clavulanate]; Benadryl [diphenhydramine]; Compazine [prochlorperazine]; Diflucan [fluconazole]; Dilaudid [hydromorphone hcl]; Hydromorphone; Hydroxyzine; Morphine; Other; Prednisone; Tramadol hcl; Antihistamines, loratadine-type; Macrobid [nitrofurantoin]; and Sulfa antibiotics    Review of Systems   Review of Systems  Cardiovascular:  Positive for chest pain.    Physical Exam Updated Vital Signs BP (!) 113/53   Pulse 82   Temp 98.3 F (36.8 C) (Oral)   Resp 16   Wt 79.4 kg   SpO2 95%   BMI 34.18 kg/m  Physical Exam Vitals and nursing note reviewed.  Constitutional:      General: She is not in acute distress.    Appearance: She is well-developed.  HENT:     Head: Normocephalic and atraumatic.     Right Ear: External ear normal.     Left Ear: External ear normal.  Eyes:      General: No scleral icterus.       Right eye: No discharge.        Left eye: No discharge.     Conjunctiva/sclera: Conjunctivae normal.  Neck:     Trachea: No tracheal deviation.  Cardiovascular:     Rate and Rhythm: Normal rate and regular rhythm.  Pulmonary:     Effort: Pulmonary effort is normal. No respiratory distress.     Breath sounds: Normal breath sounds. No stridor. No wheezing or rales.  Abdominal:     General: Bowel sounds are normal. There is no distension.     Palpations: Abdomen is soft.     Tenderness: There is abdominal tenderness. There is no guarding or rebound.  Musculoskeletal:        General: No tenderness or deformity.     Cervical back: Neck supple.  Skin:    General: Skin is warm and dry.     Findings: No rash.  Neurological:     General: No focal deficit present.     Mental Status: She is alert.     Cranial Nerves: No cranial nerve deficit, dysarthria or facial asymmetry.     Sensory: No sensory deficit.     Motor: No abnormal muscle tone or seizure activity.     Coordination: Coordination normal.  Psychiatric:        Mood and Affect: Mood normal.     ED Results / Procedures / Treatments   Labs (all labs ordered are listed, but only abnormal results are displayed) Labs Reviewed  CBC - Abnormal; Notable for the following components:      Result Value   RBC 5.14 (*)    HCT 46.2 (*)    All other components within normal limits  COMPREHENSIVE METABOLIC PANEL - Abnormal; Notable for the following components:   Glucose, Bld 103 (*)    Creatinine, Ser 1.21 (*)    Calcium 10.5 (*)    GFR, Estimated 47 (*)    All other components within normal limits  URINALYSIS, ROUTINE W REFLEX MICROSCOPIC  LIPASE, BLOOD  D-DIMER, QUANTITATIVE  TROPONIN I (HIGH SENSITIVITY)  TROPONIN I (HIGH SENSITIVITY)    EKG EKG Interpretation Date/Time:  Friday April 02 2023 10:34:35 EST Ventricular Rate:  72 PR Interval:  178 QRS Duration:  88 QT  Interval:  386 QTC Calculation: 423 R Axis:   71  Text Interpretation: Sinus rhythm Low voltage, precordial leads No significant change since last tracing Confirmed by Linwood Dibbles [phone]) on 04/02/2023 10:39:49 AM  Radiology DG Chest Portable 1 View Result Date: 04/02/2023 CLINICAL DATA:  Nausea for 5 days.  Chest pain and left arm pressure EXAM: PORTABLE CHEST 1 VIEW COMPARISON:  X-ray 12/24/2022 FINDINGS: Slight elevation of the right hemidiaphragm. No consolidation, pneumothorax or effusion. No edema. Normal cardiopericardial silhouette overlapping cardiac leads. Degenerative changes of the spine. IMPRESSION: Underinflation with slight elevation of the right hemidiaphragm. No consolidation. No edema Electronically Signed   By: Karen Kays M.D.   On: 04/02/2023 11:33    Procedures Procedures    Medications Ordered in ED Medications  ondansetron (ZOFRAN) injection 4 mg (4 mg Intravenous Given 04/02/23 1142)  fentaNYL (SUBLIMAZE) injection 50 mcg (50 mcg Intravenous Given 04/02/23 1142)    ED Course/ Medical Decision Making/ A&P Clinical Course as of 04/02/23 1504  Fri Apr 02, 2023  1059 Spoke with [employer] radiology.  Will have her outpatient abdominal CT scan performed on 1/22 moved up for emergent reading [JK]  1303 Metabolic panel normal.  CBC normal.  D-dimer normal.  Urinalysis negative.  Initial troponin normal [JK]  1303 Chest x-ray without signs of edema or pneumonia [JK]  1303 CT scan of the abdomen pelvis does not show any acute abnormality [JK]    Clinical Course User Index [JK] Linwood Dibbles, MD                                 Medical Decision Making Amount and/or Complexity of Data Reviewed Labs: ordered. Radiology: ordered.  Risk Prescription drug management.   Patient presented to the ED for evaluation of chest pressure and arm pressure.  Symptoms ongoing for several days.  ED workup reassuring.  Patient does not have any of pneumonia or pneumothorax.  D-dimer  negative doubt PE.  Serial troponins are normal.  Doubt her chest pain is related to acute coronary syndrome.  Patient also has been having issues with recurrent abdominal pain.  She had an outpatient CT scan performed couple days ago.  I asked radiology to change her read from an outpatient study to any emergent study.  There is no acute abnormality noted.  Patient symptoms have improved.  She appears appropriate for discharge outpatient follow-up.  Evaluation and diagnostic testing in the emergency department does not suggest an emergent condition requiring admission or immediate intervention beyond what has been performed at this time.  The patient is safe for discharge and has been instructed to return immediately for worsening symptoms, change in symptoms or any other concerns.         Final Clinical Impression(s) / ED Diagnoses Final diagnoses:  Chest pain, unspecified type    Rx / DC Orders ED Discharge Orders     None         Linwood Dibbles, MD 04/02/23 1505

## 2023-04-02 NOTE — Discharge Instructions (Signed)
The test today in the ED did not show any serious cause of chest pain.  Your blood test and x-rays were normal.  The CAT scan that your doctor ordered the other day also did not show any acute abnormality.  Follow-up with your primary care doctor for further evaluation

## 2023-04-02 NOTE — ED Triage Notes (Signed)
Pt reports central CP and LT arm "pressure" with nausea x 5 days, and LLQ pain for awhile.

## 2023-04-05 ENCOUNTER — Other Ambulatory Visit: Payer: Self-pay | Admitting: Endocrinology

## 2023-04-05 DIAGNOSIS — E119 Type 2 diabetes mellitus without complications: Secondary | ICD-10-CM

## 2023-04-08 DIAGNOSIS — N8110 Cystocele, unspecified: Secondary | ICD-10-CM | POA: Diagnosis not present

## 2023-04-09 DIAGNOSIS — K5900 Constipation, unspecified: Secondary | ICD-10-CM | POA: Diagnosis not present

## 2023-04-14 ENCOUNTER — Encounter: Payer: Self-pay | Admitting: Endocrinology

## 2023-04-15 ENCOUNTER — Other Ambulatory Visit (HOSPITAL_COMMUNITY): Payer: Self-pay | Admitting: Orthopedic Surgery

## 2023-04-15 DIAGNOSIS — M1712 Unilateral primary osteoarthritis, left knee: Secondary | ICD-10-CM | POA: Diagnosis not present

## 2023-04-15 DIAGNOSIS — Z96652 Presence of left artificial knee joint: Secondary | ICD-10-CM

## 2023-04-15 NOTE — Telephone Encounter (Signed)
**Note De-identified  [place] Obfuscation** Please advise 

## 2023-04-16 NOTE — Telephone Encounter (Signed)
 Pt stated --seen gastro and prescribe antibiotic for stomach pain.

## 2023-04-16 NOTE — Telephone Encounter (Signed)
 It is unlikely to cause gastrointestinal symptoms due to injection into the abdominal wall the skin.  However, it may cause the soreness at the injection site

## 2023-04-19 DIAGNOSIS — R0981 Nasal congestion: Secondary | ICD-10-CM | POA: Diagnosis not present

## 2023-04-19 DIAGNOSIS — R300 Dysuria: Secondary | ICD-10-CM | POA: Diagnosis not present

## 2023-04-19 DIAGNOSIS — K146 Glossodynia: Secondary | ICD-10-CM | POA: Diagnosis not present

## 2023-04-19 DIAGNOSIS — E119 Type 2 diabetes mellitus without complications: Secondary | ICD-10-CM | POA: Diagnosis not present

## 2023-04-20 DIAGNOSIS — M5416 Radiculopathy, lumbar region: Secondary | ICD-10-CM | POA: Diagnosis not present

## 2023-04-22 DIAGNOSIS — M5459 Other low back pain: Secondary | ICD-10-CM | POA: Diagnosis not present

## 2023-04-22 DIAGNOSIS — G629 Polyneuropathy, unspecified: Secondary | ICD-10-CM | POA: Diagnosis not present

## 2023-04-22 DIAGNOSIS — Z79891 Long term (current) use of opiate analgesic: Secondary | ICD-10-CM | POA: Diagnosis not present

## 2023-04-22 DIAGNOSIS — G894 Chronic pain syndrome: Secondary | ICD-10-CM | POA: Diagnosis not present

## 2023-04-22 DIAGNOSIS — Z5181 Encounter for therapeutic drug level monitoring: Secondary | ICD-10-CM | POA: Diagnosis not present

## 2023-04-22 DIAGNOSIS — F319 Bipolar disorder, unspecified: Secondary | ICD-10-CM | POA: Diagnosis not present

## 2023-04-22 DIAGNOSIS — F4542 Pain disorder with related psychological factors: Secondary | ICD-10-CM | POA: Diagnosis not present

## 2023-04-23 ENCOUNTER — Ambulatory Visit (HOSPITAL_COMMUNITY)
Admission: RE | Admit: 2023-04-23 | Discharge: 2023-04-23 | Disposition: A | Discharge status: Home / Self Care | Payer: Medicare Other | Source: Ambulatory Visit | Attending: Orthopedic Surgery | Admitting: Orthopedic Surgery

## 2023-04-23 DIAGNOSIS — Z96652 Presence of left artificial knee joint: Secondary | ICD-10-CM | POA: Diagnosis not present

## 2023-04-23 DIAGNOSIS — Z471 Aftercare following joint replacement surgery: Secondary | ICD-10-CM | POA: Diagnosis not present

## 2023-04-23 DIAGNOSIS — M25562 Pain in left knee: Secondary | ICD-10-CM | POA: Diagnosis not present

## 2023-04-23 DIAGNOSIS — S8992XA Unspecified injury of left lower leg, initial encounter: Secondary | ICD-10-CM | POA: Diagnosis not present

## 2023-04-23 MED ORDER — TECHNETIUM TC 99M MEDRONATE IV KIT
21.1000 | PACK | Freq: Once | INTRAVENOUS | Status: AC
Start: 2023-04-23 — End: 2023-04-23
  Administered 2023-04-23: 21.1 via INTRAVENOUS

## 2023-04-26 DIAGNOSIS — L304 Erythema intertrigo: Secondary | ICD-10-CM | POA: Diagnosis not present

## 2023-04-27 DIAGNOSIS — M1712 Unilateral primary osteoarthritis, left knee: Secondary | ICD-10-CM | POA: Diagnosis not present

## 2023-04-29 ENCOUNTER — Telehealth: Payer: Self-pay | Admitting: Endocrinology

## 2023-04-29 NOTE — Telephone Encounter (Signed)
Patient has been taking Lantus 70 units at 5 PM daily. Patient is asked to take 50% which would be 35 units today at 5 PM.  She has not been requiring Humalog these days, asked not to take Humalog if not eating.  She can resume Lantus 70 units from tomorrow once restart eating as usual after the procedure.  Called and talked with the patient.

## 2023-04-29 NOTE — Telephone Encounter (Signed)
Patient is calling to say that she has a procedure tomorrow and she has to fast starting at midnight tonight.  Her procedure (CIBO) is at 1:00 PM.  She would like to know what she needs to do as far as managing her diabetic medications so that her blood sugar levels are not affected.

## 2023-04-30 ENCOUNTER — Telehealth: Payer: Self-pay | Admitting: Endocrinology

## 2023-04-30 DIAGNOSIS — R634 Abnormal weight loss: Secondary | ICD-10-CM | POA: Diagnosis not present

## 2023-04-30 DIAGNOSIS — K638219 Small intestinal bacterial overgrowth, unspecified: Secondary | ICD-10-CM | POA: Diagnosis not present

## 2023-04-30 DIAGNOSIS — K5900 Constipation, unspecified: Secondary | ICD-10-CM | POA: Diagnosis not present

## 2023-04-30 DIAGNOSIS — R1084 Generalized abdominal pain: Secondary | ICD-10-CM | POA: Diagnosis not present

## 2023-04-30 DIAGNOSIS — R12 Heartburn: Secondary | ICD-10-CM | POA: Diagnosis not present

## 2023-04-30 DIAGNOSIS — R140 Abdominal distension (gaseous): Secondary | ICD-10-CM | POA: Diagnosis not present

## 2023-04-30 DIAGNOSIS — R110 Nausea: Secondary | ICD-10-CM | POA: Diagnosis not present

## 2023-04-30 DIAGNOSIS — R142 Eructation: Secondary | ICD-10-CM | POA: Diagnosis not present

## 2023-04-30 NOTE — Telephone Encounter (Signed)
Please double check how much she took Lantus yesterday I advised to take 50% of her regular dose which would be 35 units?  In regard to blood sugar 98, she may have to keep n.p.o. at this time, I recommend to check blood sugar every 30 minutes, if the sugar is going below 70 she can take glucose tablets 2 tablets.  Taking glucose tablet is okay in case of n.p.o. as well.  Iraq Reshma Hoey, MD [facility] Endocrinology [facility] [address] [place], [address] [place], [place] [address] Phone # [phone]

## 2023-04-30 NOTE — Telephone Encounter (Signed)
Spoke to patient as advised, stated understanding of instructions and advisement of MD. Patient also confirmed she took 35 units of Lantus as directed initially.

## 2023-04-30 NOTE — Telephone Encounter (Signed)
Patient is calling stating that she has been fasting since midnight last night and right now her blood sugar level is dropping.  She is having a procedure done at 1:00 pm today and would like to know what she needs to do about her blood sugar level.  It is 98 right now at 9:29 AM.

## 2023-05-04 ENCOUNTER — Other Ambulatory Visit: Payer: Self-pay

## 2023-05-04 ENCOUNTER — Telehealth: Payer: Self-pay | Admitting: Physician Assistant

## 2023-05-04 ENCOUNTER — Other Ambulatory Visit: Payer: Self-pay | Admitting: Physician Assistant

## 2023-05-04 DIAGNOSIS — F319 Bipolar disorder, unspecified: Secondary | ICD-10-CM

## 2023-05-04 DIAGNOSIS — G2581 Restless legs syndrome: Secondary | ICD-10-CM

## 2023-05-04 MED ORDER — DIVALPROEX SODIUM 250 MG PO DR TAB: 500.0000 mg | DELAYED_RELEASE_TABLET | Freq: Every day | ORAL | 0 refills | Status: DC

## 2023-05-04 MED ORDER — ROPINIROLE HCL 0.5 MG PO TABS: ORAL_TABLET | ORAL | 0 refills | Status: DC

## 2023-05-04 NOTE — Telephone Encounter (Signed)
 Verified with patient which medication she needed sent in.  Sent requip and depakote to [facility].

## 2023-05-04 NOTE — Telephone Encounter (Signed)
LVM to r/c.

## 2023-05-04 NOTE — Telephone Encounter (Signed)
 Called pt to advise first apt must be in office. Previous pt of JC. Has apt with TH 5/5 in office. TH agreed to send in couple months refill on meds if pt is doing good. Pt reported she is doing good with meds JC has been following. She has other health issues with spine and stomach. Gets injections and hard to walk. Pt stated no longer takes Prozac. Only Depakote and restless leg med  Ropinirole? [facility] on [address]. Contact # for pt [phone]

## 2023-05-04 NOTE — Telephone Encounter (Signed)
 Pt returned Raven's call at 2:30p

## 2023-05-05 DIAGNOSIS — R1310 Dysphagia, unspecified: Secondary | ICD-10-CM | POA: Diagnosis not present

## 2023-05-05 DIAGNOSIS — R194 Change in bowel habit: Secondary | ICD-10-CM | POA: Diagnosis not present

## 2023-05-05 DIAGNOSIS — K219 Gastro-esophageal reflux disease without esophagitis: Secondary | ICD-10-CM | POA: Diagnosis not present

## 2023-05-11 ENCOUNTER — Telehealth: Payer: Self-pay | Admitting: Endocrinology

## 2023-05-11 ENCOUNTER — Telehealth: Payer: Medicare Other | Admitting: Physician Assistant

## 2023-05-11 NOTE — Telephone Encounter (Signed)
 Patient called about Continuous Glucose Sensor (FREESTYLE LIBRE 2 SENSOR) MISC and said the company that delivers it [employer] is always late and she always runs out before she gets them. She wanted to know if we had any samples here she can get. Call back is [phone].

## 2023-05-12 ENCOUNTER — Other Ambulatory Visit: Payer: Self-pay

## 2023-05-12 DIAGNOSIS — E1165 Type 2 diabetes mellitus with hyperglycemia: Secondary | ICD-10-CM

## 2023-05-12 DIAGNOSIS — M5416 Radiculopathy, lumbar region: Secondary | ICD-10-CM | POA: Diagnosis not present

## 2023-05-12 DIAGNOSIS — M47814 Spondylosis without myelopathy or radiculopathy, thoracic region: Secondary | ICD-10-CM | POA: Diagnosis not present

## 2023-05-12 MED ORDER — FREESTYLE LIBRE 2 SENSOR MISC: 2 refills | Status: DC

## 2023-05-14 ENCOUNTER — Telehealth: Payer: Self-pay

## 2023-05-14 DIAGNOSIS — Z794 Long term (current) use of insulin: Secondary | ICD-10-CM

## 2023-05-14 MED ORDER — FREESTYLE LIBRE 3 PLUS SENSOR MISC
1.0000 | 3 refills | Status: AC
Start: 2023-05-14 — End: ?

## 2023-05-14 MED ORDER — FREESTYLE LIBRE 3 READER DEVI: 1.0000 | 0 refills | Status: AC

## 2023-05-14 NOTE — Telephone Encounter (Signed)
 Pharmacist called from [facility] and lvm stating the pt's medicare part b is only covering the freestyle libre sensors and reader for the plus 2 or plus 3 and not the freestyle libre 2 anymore.  He stated he needs a new prescription sent in for freestyle libre reader and sensors for plus 2 or plus 3. Please review and advise.

## 2023-05-14 NOTE — Telephone Encounter (Signed)
 Sent prescription for freestyle libre 3+ sensor and reader.

## 2023-05-17 DIAGNOSIS — M5416 Radiculopathy, lumbar region: Secondary | ICD-10-CM | POA: Diagnosis not present

## 2023-05-17 DIAGNOSIS — M7918 Myalgia, other site: Secondary | ICD-10-CM | POA: Diagnosis not present

## 2023-05-19 DIAGNOSIS — M7918 Myalgia, other site: Secondary | ICD-10-CM | POA: Diagnosis not present

## 2023-05-20 ENCOUNTER — Encounter: Payer: Self-pay | Admitting: Endocrinology

## 2023-05-20 DIAGNOSIS — Z79891 Long term (current) use of opiate analgesic: Secondary | ICD-10-CM | POA: Diagnosis not present

## 2023-05-20 DIAGNOSIS — M9953 Intervertebral disc stenosis of neural canal of lumbar region: Secondary | ICD-10-CM | POA: Diagnosis not present

## 2023-05-20 DIAGNOSIS — Z5181 Encounter for therapeutic drug level monitoring: Secondary | ICD-10-CM | POA: Diagnosis not present

## 2023-05-20 DIAGNOSIS — G894 Chronic pain syndrome: Secondary | ICD-10-CM | POA: Diagnosis not present

## 2023-05-20 DIAGNOSIS — M5459 Other low back pain: Secondary | ICD-10-CM | POA: Diagnosis not present

## 2023-05-20 NOTE — Telephone Encounter (Signed)
 I would recommend to take Humalog 5 units plus as per sliding scale as follows based on blood sugar before eating, 3 times a day.   Moderate Sliding Scale Blood Glucose        Insulin 60-150                     None 151-200                   3 units 201-250                   5 units 251-300                   7 units 301-350                   9 units 351-400                  11 units    >400                       12 units and call provider  Hyperglycemia is due to steroid injection, it will gradually wear off and you will need lesser insulin, you can back down on the dose of Humalog accordingly.  Let us know if you have any questions in the meantime.  [name] Lakethia Coppess, MD [facility] Endocrinology [facility] [address] [name], [address] [place], [place] [address] Phone # [phone]

## 2023-05-20 NOTE — Telephone Encounter (Signed)
 Patient called this afternoon and states that her blood sugar level is 264 at this time (3:00 PM).  Patient took Humalog around 12:30 PM today.  She would like to know what she needs to do.

## 2023-05-26 DIAGNOSIS — Z5181 Encounter for therapeutic drug level monitoring: Secondary | ICD-10-CM | POA: Diagnosis not present

## 2023-05-26 DIAGNOSIS — G894 Chronic pain syndrome: Secondary | ICD-10-CM | POA: Diagnosis not present

## 2023-05-26 DIAGNOSIS — M5459 Other low back pain: Secondary | ICD-10-CM | POA: Diagnosis not present

## 2023-05-26 DIAGNOSIS — Z79891 Long term (current) use of opiate analgesic: Secondary | ICD-10-CM | POA: Diagnosis not present

## 2023-06-01 ENCOUNTER — Encounter: Payer: Self-pay | Admitting: Endocrinology

## 2023-06-02 ENCOUNTER — Encounter: Payer: Self-pay | Admitting: Endocrinology

## 2023-06-02 ENCOUNTER — Telehealth: Payer: Self-pay

## 2023-06-02 NOTE — Telephone Encounter (Signed)
 Refer to another message from today.

## 2023-06-02 NOTE — Telephone Encounter (Signed)
 Please refer to the earlier message from me today, and forward to the patient.

## 2023-06-02 NOTE — Telephone Encounter (Signed)
 Error

## 2023-06-02 NOTE — Telephone Encounter (Signed)
 Called and talked with the patient.  Blood sugar 58 was last night, this morning blood sugar is 91.  Patient reports lately she has been running low normal blood sugar in the range of 70-80 range.  Sometime it goes 60s and last night was 58.  She is currently taking Lantus 70 unit at bedtime every day.  She took last night.  She is planned to have endoscopy and colonoscopy, on liquid diet today.  She reports lately she has been eating less and slowly losing weight as well.  Plan: -Discussed that there is a chance she can have low blood sugar today in the afternoon and overnight as she had taken Lantus 70 unit full dose and she is currently on liquid diet.  Advised to monitor blood sugar with CGM she has freestyle libre 2 closely and correct for low blood sugar, advised to correct with juice or glucose tablet can take 2 to 4 tablets at a time.  She has glucose tablet at home.  Instructions for correcting low blood sugars sent on the earlier message today. -Patient has endoscopy and colonoscopy tomorrow early morning.   Plan for Lantus/insulin.  -If the blood sugar is < 150 at bedtime today, do not take Lantus today. -If the blood sugar > 150 at bedtime today, take Lantus 20 units at bedtime today. -After endoscopy and colonoscopy tomorrow and once started to eat regular meals, decrease Lantus from 70 to 60 units daily at bedtime.  Please forward this message to the patient.  [name] Winnifred Dufford, MD [facility] Endocrinology [facility] [address] [place], [address] [place], [place] [address] Phone # [phone]

## 2023-06-03 DIAGNOSIS — R194 Change in bowel habit: Secondary | ICD-10-CM | POA: Diagnosis not present

## 2023-06-03 DIAGNOSIS — K222 Esophageal obstruction: Secondary | ICD-10-CM | POA: Diagnosis not present

## 2023-06-03 DIAGNOSIS — K219 Gastro-esophageal reflux disease without esophagitis: Secondary | ICD-10-CM | POA: Diagnosis not present

## 2023-06-03 DIAGNOSIS — R1310 Dysphagia, unspecified: Secondary | ICD-10-CM | POA: Diagnosis not present

## 2023-06-03 DIAGNOSIS — K317 Polyp of stomach and duodenum: Secondary | ICD-10-CM | POA: Diagnosis not present

## 2023-06-03 DIAGNOSIS — D123 Benign neoplasm of transverse colon: Secondary | ICD-10-CM | POA: Diagnosis not present

## 2023-06-03 DIAGNOSIS — R1084 Generalized abdominal pain: Secondary | ICD-10-CM | POA: Diagnosis not present

## 2023-06-03 DIAGNOSIS — K2930 Chronic superficial gastritis without bleeding: Secondary | ICD-10-CM | POA: Diagnosis not present

## 2023-06-08 ENCOUNTER — Ambulatory Visit: Payer: Medicare Other | Admitting: Endocrinology

## 2023-06-08 DIAGNOSIS — D123 Benign neoplasm of transverse colon: Secondary | ICD-10-CM | POA: Diagnosis not present

## 2023-06-08 DIAGNOSIS — K317 Polyp of stomach and duodenum: Secondary | ICD-10-CM | POA: Diagnosis not present

## 2023-06-08 DIAGNOSIS — K219 Gastro-esophageal reflux disease without esophagitis: Secondary | ICD-10-CM | POA: Diagnosis not present

## 2023-06-08 DIAGNOSIS — K2930 Chronic superficial gastritis without bleeding: Secondary | ICD-10-CM | POA: Diagnosis not present

## 2023-06-14 ENCOUNTER — Encounter: Payer: Self-pay | Admitting: Endocrinology

## 2023-06-14 ENCOUNTER — Ambulatory Visit (INDEPENDENT_AMBULATORY_CARE_PROVIDER_SITE_OTHER): Admitting: Endocrinology

## 2023-06-14 VITALS — BP 126/60 | HR 72 | Resp 16 | Ht 60.0 in | Wt 173.0 lb

## 2023-06-14 DIAGNOSIS — E119 Type 2 diabetes mellitus without complications: Secondary | ICD-10-CM

## 2023-06-14 DIAGNOSIS — Z794 Long term (current) use of insulin: Secondary | ICD-10-CM | POA: Diagnosis not present

## 2023-06-14 LAB — POCT GLYCOSYLATED HEMOGLOBIN (HGB A1C): Hemoglobin A1C: 6.1 % — AB (ref 4.0–5.6)

## 2023-06-14 MED ORDER — LANTUS SOLOSTAR 100 UNIT/ML ~~LOC~~ SOPN: 56.0000 [IU] | PEN_INJECTOR | Freq: Every day | SUBCUTANEOUS | 4 refills | Status: DC
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
## 2023-06-14 NOTE — Patient Instructions (Signed)
 Decrease Lantus to 56 units daily.   Use humalog as needed.

## 2023-06-16 ENCOUNTER — Other Ambulatory Visit: Payer: Self-pay

## 2023-06-17 DIAGNOSIS — F319 Bipolar disorder, unspecified: Secondary | ICD-10-CM | POA: Diagnosis not present

## 2023-06-17 DIAGNOSIS — M9953 Intervertebral disc stenosis of neural canal of lumbar region: Secondary | ICD-10-CM | POA: Diagnosis not present

## 2023-06-17 DIAGNOSIS — Z79891 Long term (current) use of opiate analgesic: Secondary | ICD-10-CM | POA: Diagnosis not present

## 2023-06-17 DIAGNOSIS — G894 Chronic pain syndrome: Secondary | ICD-10-CM | POA: Diagnosis not present

## 2023-06-17 DIAGNOSIS — F4542 Pain disorder with related psychological factors: Secondary | ICD-10-CM | POA: Diagnosis not present

## 2023-06-17 DIAGNOSIS — G629 Polyneuropathy, unspecified: Secondary | ICD-10-CM | POA: Diagnosis not present

## 2023-06-17 DIAGNOSIS — Z5181 Encounter for therapeutic drug level monitoring: Secondary | ICD-10-CM | POA: Diagnosis not present

## 2023-06-17 DIAGNOSIS — M5459 Other low back pain: Secondary | ICD-10-CM | POA: Diagnosis not present

## 2023-06-24 DIAGNOSIS — S99921A Unspecified injury of right foot, initial encounter: Secondary | ICD-10-CM | POA: Diagnosis not present

## 2023-06-28 ENCOUNTER — Ambulatory Visit (INDEPENDENT_AMBULATORY_CARE_PROVIDER_SITE_OTHER): Admitting: Podiatry

## 2023-06-28 DIAGNOSIS — Z91199 Patient's noncompliance with other medical treatment and regimen due to unspecified reason: Secondary | ICD-10-CM

## 2023-06-29 DIAGNOSIS — M79605 Pain in left leg: Secondary | ICD-10-CM | POA: Diagnosis not present

## 2023-06-29 DIAGNOSIS — L03116 Cellulitis of left lower limb: Secondary | ICD-10-CM | POA: Diagnosis not present

## 2023-06-29 DIAGNOSIS — M79604 Pain in right leg: Secondary | ICD-10-CM | POA: Diagnosis not present

## 2023-06-29 NOTE — Progress Notes (Signed)
 Patient was no-show for appointment today

## 2023-07-03 DIAGNOSIS — M62838 Other muscle spasm: Secondary | ICD-10-CM | POA: Diagnosis not present

## 2023-07-03 DIAGNOSIS — R609 Edema, unspecified: Secondary | ICD-10-CM | POA: Diagnosis not present

## 2023-07-03 DIAGNOSIS — L03116 Cellulitis of left lower limb: Secondary | ICD-10-CM | POA: Diagnosis not present

## 2023-07-07 DIAGNOSIS — F319 Bipolar disorder, unspecified: Secondary | ICD-10-CM | POA: Diagnosis not present

## 2023-07-07 DIAGNOSIS — E119 Type 2 diabetes mellitus without complications: Secondary | ICD-10-CM | POA: Diagnosis not present

## 2023-07-07 DIAGNOSIS — E1165 Type 2 diabetes mellitus with hyperglycemia: Secondary | ICD-10-CM | POA: Diagnosis not present

## 2023-07-07 DIAGNOSIS — J189 Pneumonia, unspecified organism: Secondary | ICD-10-CM | POA: Diagnosis not present

## 2023-07-07 DIAGNOSIS — L03116 Cellulitis of left lower limb: Secondary | ICD-10-CM | POA: Diagnosis not present

## 2023-07-07 DIAGNOSIS — R1031 Right lower quadrant pain: Secondary | ICD-10-CM | POA: Diagnosis not present

## 2023-07-07 DIAGNOSIS — R1032 Left lower quadrant pain: Secondary | ICD-10-CM | POA: Diagnosis not present

## 2023-07-12 ENCOUNTER — Ambulatory Visit (INDEPENDENT_AMBULATORY_CARE_PROVIDER_SITE_OTHER): Payer: Medicare Other | Admitting: Physician Assistant

## 2023-07-12 DIAGNOSIS — N898 Other specified noninflammatory disorders of vagina: Secondary | ICD-10-CM | POA: Diagnosis not present

## 2023-07-12 DIAGNOSIS — B372 Candidiasis of skin and nail: Secondary | ICD-10-CM | POA: Diagnosis not present

## 2023-07-12 DIAGNOSIS — Z91199 Patient's noncompliance with other medical treatment and regimen due to unspecified reason: Secondary | ICD-10-CM

## 2023-07-12 DIAGNOSIS — R300 Dysuria: Secondary | ICD-10-CM | POA: Diagnosis not present

## 2023-07-12 NOTE — Progress Notes (Signed)
 No show

## 2023-07-13 ENCOUNTER — Other Ambulatory Visit: Payer: Self-pay | Admitting: Gastroenterology

## 2023-07-13 DIAGNOSIS — R109 Unspecified abdominal pain: Secondary | ICD-10-CM

## 2023-07-15 DIAGNOSIS — Z5181 Encounter for therapeutic drug level monitoring: Secondary | ICD-10-CM | POA: Diagnosis not present

## 2023-07-15 DIAGNOSIS — M5459 Other low back pain: Secondary | ICD-10-CM | POA: Diagnosis not present

## 2023-07-15 DIAGNOSIS — M797 Fibromyalgia: Secondary | ICD-10-CM | POA: Diagnosis not present

## 2023-07-15 DIAGNOSIS — Z79891 Long term (current) use of opiate analgesic: Secondary | ICD-10-CM | POA: Diagnosis not present

## 2023-07-15 DIAGNOSIS — M51362 Other intervertebral disc degeneration, lumbar region with discogenic back pain and lower extremity pain: Secondary | ICD-10-CM | POA: Diagnosis not present

## 2023-07-15 DIAGNOSIS — M9953 Intervertebral disc stenosis of neural canal of lumbar region: Secondary | ICD-10-CM | POA: Diagnosis not present

## 2023-07-15 DIAGNOSIS — G894 Chronic pain syndrome: Secondary | ICD-10-CM | POA: Diagnosis not present

## 2023-07-16 ENCOUNTER — Ambulatory Visit
Admission: RE | Admit: 2023-07-16 | Discharge: 2023-07-16 | Disposition: A | Discharge status: Home / Self Care | Source: Ambulatory Visit | Attending: Gastroenterology

## 2023-07-16 DIAGNOSIS — R109 Unspecified abdominal pain: Secondary | ICD-10-CM

## 2023-07-16 MED ORDER — IOPAMIDOL (ISOVUE-300) INJECTION 61%: 100.0000 mL | Freq: Once | INTRAVENOUS | Status: DC | PRN

## 2023-07-16 MED ORDER — IOPAMIDOL (ISOVUE-300) INJECTION 61%
100.0000 mL | Freq: Once | INTRAVENOUS | Status: AC | PRN
  Administered 2023-07-16: 100 mL via INTRAVENOUS

## 2023-07-19 ENCOUNTER — Other Ambulatory Visit: Payer: Self-pay | Admitting: Family Medicine

## 2023-07-19 ENCOUNTER — Other Ambulatory Visit (HOSPITAL_COMMUNITY): Payer: Self-pay | Admitting: Gastroenterology

## 2023-07-19 DIAGNOSIS — Z1231 Encounter for screening mammogram for malignant neoplasm of breast: Secondary | ICD-10-CM

## 2023-07-19 DIAGNOSIS — R109 Unspecified abdominal pain: Secondary | ICD-10-CM

## 2023-07-20 ENCOUNTER — Ambulatory Visit
Admission: RE | Admit: 2023-07-20 | Discharge: 2023-07-20 | Discharge status: Home / Self Care | Source: Ambulatory Visit | Attending: Family Medicine | Admitting: Family Medicine

## 2023-07-20 DIAGNOSIS — Z1231 Encounter for screening mammogram for malignant neoplasm of breast: Secondary | ICD-10-CM

## 2023-07-21 DIAGNOSIS — K5900 Constipation, unspecified: Secondary | ICD-10-CM | POA: Diagnosis not present

## 2023-07-21 DIAGNOSIS — R109 Unspecified abdominal pain: Secondary | ICD-10-CM | POA: Diagnosis not present

## 2023-07-22 DIAGNOSIS — Z9089 Acquired absence of other organs: Secondary | ICD-10-CM | POA: Diagnosis not present

## 2023-07-22 DIAGNOSIS — Z604 Social exclusion and rejection: Secondary | ICD-10-CM | POA: Diagnosis not present

## 2023-07-22 DIAGNOSIS — Z87440 Personal history of urinary (tract) infections: Secondary | ICD-10-CM | POA: Diagnosis not present

## 2023-07-22 DIAGNOSIS — L932 Other local lupus erythematosus: Secondary | ICD-10-CM | POA: Diagnosis not present

## 2023-07-22 DIAGNOSIS — Z96652 Presence of left artificial knee joint: Secondary | ICD-10-CM | POA: Diagnosis not present

## 2023-07-22 DIAGNOSIS — I10 Essential (primary) hypertension: Secondary | ICD-10-CM | POA: Diagnosis not present

## 2023-07-22 DIAGNOSIS — M4726 Other spondylosis with radiculopathy, lumbar region: Secondary | ICD-10-CM | POA: Diagnosis not present

## 2023-07-22 DIAGNOSIS — Z96611 Presence of right artificial shoulder joint: Secondary | ICD-10-CM | POA: Diagnosis not present

## 2023-07-22 DIAGNOSIS — Z79891 Long term (current) use of opiate analgesic: Secondary | ICD-10-CM | POA: Diagnosis not present

## 2023-07-22 DIAGNOSIS — M47892 Other spondylosis, cervical region: Secondary | ICD-10-CM | POA: Diagnosis not present

## 2023-07-22 DIAGNOSIS — Z96641 Presence of right artificial hip joint: Secondary | ICD-10-CM | POA: Diagnosis not present

## 2023-07-22 DIAGNOSIS — F319 Bipolar disorder, unspecified: Secondary | ICD-10-CM | POA: Diagnosis not present

## 2023-07-22 DIAGNOSIS — Z79890 Hormone replacement therapy (postmenopausal): Secondary | ICD-10-CM | POA: Diagnosis not present

## 2023-07-22 DIAGNOSIS — Z794 Long term (current) use of insulin: Secondary | ICD-10-CM | POA: Diagnosis not present

## 2023-07-22 DIAGNOSIS — K589 Irritable bowel syndrome without diarrhea: Secondary | ICD-10-CM | POA: Diagnosis not present

## 2023-07-22 DIAGNOSIS — E119 Type 2 diabetes mellitus without complications: Secondary | ICD-10-CM | POA: Diagnosis not present

## 2023-07-22 DIAGNOSIS — M419 Scoliosis, unspecified: Secondary | ICD-10-CM | POA: Diagnosis not present

## 2023-07-22 DIAGNOSIS — M799 Soft tissue disorder, unspecified: Secondary | ICD-10-CM | POA: Diagnosis not present

## 2023-07-22 DIAGNOSIS — Z9989 Dependence on other enabling machines and devices: Secondary | ICD-10-CM | POA: Diagnosis not present

## 2023-07-22 DIAGNOSIS — M7918 Myalgia, other site: Secondary | ICD-10-CM | POA: Diagnosis not present

## 2023-07-22 DIAGNOSIS — Z90710 Acquired absence of both cervix and uterus: Secondary | ICD-10-CM | POA: Diagnosis not present

## 2023-07-25 ENCOUNTER — Other Ambulatory Visit: Payer: Self-pay | Admitting: Physician Assistant

## 2023-07-25 DIAGNOSIS — G2581 Restless legs syndrome: Secondary | ICD-10-CM

## 2023-07-26 DIAGNOSIS — Z794 Long term (current) use of insulin: Secondary | ICD-10-CM | POA: Diagnosis not present

## 2023-07-26 DIAGNOSIS — M7918 Myalgia, other site: Secondary | ICD-10-CM | POA: Diagnosis not present

## 2023-07-26 DIAGNOSIS — M4726 Other spondylosis with radiculopathy, lumbar region: Secondary | ICD-10-CM | POA: Diagnosis not present

## 2023-07-26 DIAGNOSIS — F319 Bipolar disorder, unspecified: Secondary | ICD-10-CM | POA: Diagnosis not present

## 2023-07-26 DIAGNOSIS — M47892 Other spondylosis, cervical region: Secondary | ICD-10-CM | POA: Diagnosis not present

## 2023-07-26 DIAGNOSIS — E119 Type 2 diabetes mellitus without complications: Secondary | ICD-10-CM | POA: Diagnosis not present

## 2023-07-28 ENCOUNTER — Ambulatory Visit (HOSPITAL_COMMUNITY)
Admission: RE | Admit: 2023-07-28 | Discharge: 2023-07-28 | Disposition: A | Discharge status: Home / Self Care | Source: Ambulatory Visit | Attending: Gastroenterology

## 2023-07-28 DIAGNOSIS — R109 Unspecified abdominal pain: Secondary | ICD-10-CM | POA: Insufficient documentation

## 2023-07-28 MED ORDER — TECHNETIUM TC 99M MEBROFENIN IV KIT
5.0000 | PACK | Freq: Once | INTRAVENOUS | Status: AC | PRN
  Administered 2023-07-28: 5 via INTRAVENOUS

## 2023-07-29 DIAGNOSIS — M4726 Other spondylosis with radiculopathy, lumbar region: Secondary | ICD-10-CM | POA: Diagnosis not present

## 2023-07-29 DIAGNOSIS — M47892 Other spondylosis, cervical region: Secondary | ICD-10-CM | POA: Diagnosis not present

## 2023-07-29 DIAGNOSIS — M7918 Myalgia, other site: Secondary | ICD-10-CM | POA: Diagnosis not present

## 2023-07-29 DIAGNOSIS — E119 Type 2 diabetes mellitus without complications: Secondary | ICD-10-CM | POA: Diagnosis not present

## 2023-07-29 DIAGNOSIS — Z794 Long term (current) use of insulin: Secondary | ICD-10-CM | POA: Diagnosis not present

## 2023-07-29 DIAGNOSIS — F319 Bipolar disorder, unspecified: Secondary | ICD-10-CM | POA: Diagnosis not present

## 2023-07-30 DIAGNOSIS — M545 Low back pain, unspecified: Secondary | ICD-10-CM | POA: Diagnosis not present

## 2023-08-04 ENCOUNTER — Other Ambulatory Visit: Payer: Self-pay | Admitting: Physician Assistant

## 2023-08-04 DIAGNOSIS — Z794 Long term (current) use of insulin: Secondary | ICD-10-CM | POA: Diagnosis not present

## 2023-08-04 DIAGNOSIS — F319 Bipolar disorder, unspecified: Secondary | ICD-10-CM | POA: Diagnosis not present

## 2023-08-04 DIAGNOSIS — M4726 Other spondylosis with radiculopathy, lumbar region: Secondary | ICD-10-CM | POA: Diagnosis not present

## 2023-08-04 DIAGNOSIS — M47892 Other spondylosis, cervical region: Secondary | ICD-10-CM | POA: Diagnosis not present

## 2023-08-04 DIAGNOSIS — M7918 Myalgia, other site: Secondary | ICD-10-CM | POA: Diagnosis not present

## 2023-08-04 DIAGNOSIS — E119 Type 2 diabetes mellitus without complications: Secondary | ICD-10-CM | POA: Diagnosis not present

## 2023-08-06 DIAGNOSIS — M47892 Other spondylosis, cervical region: Secondary | ICD-10-CM | POA: Diagnosis not present

## 2023-08-06 DIAGNOSIS — F319 Bipolar disorder, unspecified: Secondary | ICD-10-CM | POA: Diagnosis not present

## 2023-08-06 DIAGNOSIS — E119 Type 2 diabetes mellitus without complications: Secondary | ICD-10-CM | POA: Diagnosis not present

## 2023-08-06 DIAGNOSIS — M4726 Other spondylosis with radiculopathy, lumbar region: Secondary | ICD-10-CM | POA: Diagnosis not present

## 2023-08-06 DIAGNOSIS — Z794 Long term (current) use of insulin: Secondary | ICD-10-CM | POA: Diagnosis not present

## 2023-08-06 DIAGNOSIS — M7918 Myalgia, other site: Secondary | ICD-10-CM | POA: Diagnosis not present

## 2023-08-09 DIAGNOSIS — M4726 Other spondylosis with radiculopathy, lumbar region: Secondary | ICD-10-CM | POA: Diagnosis not present

## 2023-08-09 DIAGNOSIS — Z794 Long term (current) use of insulin: Secondary | ICD-10-CM | POA: Diagnosis not present

## 2023-08-09 DIAGNOSIS — M7918 Myalgia, other site: Secondary | ICD-10-CM | POA: Diagnosis not present

## 2023-08-09 DIAGNOSIS — F319 Bipolar disorder, unspecified: Secondary | ICD-10-CM | POA: Diagnosis not present

## 2023-08-09 DIAGNOSIS — M47892 Other spondylosis, cervical region: Secondary | ICD-10-CM | POA: Diagnosis not present

## 2023-08-09 DIAGNOSIS — E119 Type 2 diabetes mellitus without complications: Secondary | ICD-10-CM | POA: Diagnosis not present

## 2023-08-10 DIAGNOSIS — R350 Frequency of micturition: Secondary | ICD-10-CM | POA: Diagnosis not present

## 2023-08-10 DIAGNOSIS — N3000 Acute cystitis without hematuria: Secondary | ICD-10-CM | POA: Diagnosis not present

## 2023-08-10 DIAGNOSIS — R399 Unspecified symptoms and signs involving the genitourinary system: Secondary | ICD-10-CM | POA: Diagnosis not present

## 2023-08-11 DIAGNOSIS — M4726 Other spondylosis with radiculopathy, lumbar region: Secondary | ICD-10-CM | POA: Diagnosis not present

## 2023-08-11 DIAGNOSIS — Z794 Long term (current) use of insulin: Secondary | ICD-10-CM | POA: Diagnosis not present

## 2023-08-11 DIAGNOSIS — E119 Type 2 diabetes mellitus without complications: Secondary | ICD-10-CM | POA: Diagnosis not present

## 2023-08-11 DIAGNOSIS — M7918 Myalgia, other site: Secondary | ICD-10-CM | POA: Diagnosis not present

## 2023-08-11 DIAGNOSIS — F319 Bipolar disorder, unspecified: Secondary | ICD-10-CM | POA: Diagnosis not present

## 2023-08-11 DIAGNOSIS — M47892 Other spondylosis, cervical region: Secondary | ICD-10-CM | POA: Diagnosis not present

## 2023-08-12 DIAGNOSIS — Z5181 Encounter for therapeutic drug level monitoring: Secondary | ICD-10-CM | POA: Diagnosis not present

## 2023-08-12 DIAGNOSIS — M5459 Other low back pain: Secondary | ICD-10-CM | POA: Diagnosis not present

## 2023-08-12 DIAGNOSIS — M51362 Other intervertebral disc degeneration, lumbar region with discogenic back pain and lower extremity pain: Secondary | ICD-10-CM | POA: Diagnosis not present

## 2023-08-12 DIAGNOSIS — M797 Fibromyalgia: Secondary | ICD-10-CM | POA: Diagnosis not present

## 2023-08-12 DIAGNOSIS — Z79891 Long term (current) use of opiate analgesic: Secondary | ICD-10-CM | POA: Diagnosis not present

## 2023-08-12 DIAGNOSIS — G894 Chronic pain syndrome: Secondary | ICD-10-CM | POA: Diagnosis not present

## 2023-08-13 ENCOUNTER — Ambulatory Visit: Admitting: Physician Assistant

## 2023-08-13 ENCOUNTER — Encounter: Payer: Self-pay | Admitting: Physician Assistant

## 2023-08-13 DIAGNOSIS — F411 Generalized anxiety disorder: Secondary | ICD-10-CM | POA: Diagnosis not present

## 2023-08-13 DIAGNOSIS — F319 Bipolar disorder, unspecified: Secondary | ICD-10-CM

## 2023-08-13 DIAGNOSIS — G2581 Restless legs syndrome: Secondary | ICD-10-CM | POA: Diagnosis not present

## 2023-08-13 DIAGNOSIS — F5101 Primary insomnia: Secondary | ICD-10-CM

## 2023-08-13 DIAGNOSIS — Z79899 Other long term (current) drug therapy: Secondary | ICD-10-CM | POA: Diagnosis not present

## 2023-08-13 NOTE — Progress Notes (Signed)
 [facility]  Patient ID: [name] [name],  MRN: [id number]  PCP: Ronna Coho, MD  Date of Evaluation: 08/13/2023 Time spent:35 minutes  Chief Complaint:  Chief Complaint   Anxiety; Depression; Follow-up    HISTORY/CURRENT STATUS: HPI Transferring to my care from [name], NP, who is no longer with the practice.  Accompanied by her husband [name] states she's doing well as far as her mental health goes.  Stable on current treatment.  Has been enjoying making gift bags to give people. Energy and motivation are fair but feels like that's more of an issue b/c of her physical health.  Chronic severe back pain, requiring opiates.  Has had multiple different treatments over the years and not much has helped.  Is now seeing pain management. The oxycodone  makes the pain more tolerable.   No extreme sadness, tearfulness, or feelings of hopelessness.  Sleeps ok, depends on the pain.  ADLs and personal hygiene are norma for her.  Denies any changes in concentration, making decisions, or remembering things.  Appetite has not changed.  Weight is stable.  Has situational anxiety sometimes but it's not severe and she can work through it.  Denies suicidal or homicidal thoughts.  Patient denies increased energy with decreased need for sleep, increased talkativeness, racing thoughts, impulsivity or risky behaviors, increased spending, increased libido, grandiosity, increased irritability or anger, paranoia, or hallucinations.  Denies dizziness, syncope, seizures, numbness, tingling, tremor, tics, unsteady gait, slurred speech, confusion. RLS sx are controlled.   Individual Medical History/ Review of Systems: Changes? :No  Past Medication Trials: Tegretol -adverse reaction Lamictal-itching, swollen lymph nodes Trileptal-ineffective Depakote - has taken long-term with some benefit Gabapentin -prescribed for RLS, pain, and anxiety.  Unable to tolerate doses above 400 mg 3 times  daily Topamax Gabitril Lyrica Keppra Zonegran Sonata -intermittently effective Xanax -effective Ativan  Valium  Klonopin -patient reports feeling "peculiar" Lithium -has taken long-term with some improvement.  Unable to tolerate doses greater than 450 mg Seroquel - helpful for racing thoughts and mood signs and symptoms.  Daytime somnolence with higher doses.  Has caused RLS without Requip . Geodon-tolerability issues Rexulti-EPS Latuda-EPS Vraylar-EPS Saphris-excessive sedation and severe RLS Abilify -may have exacerbated gambling Risperdal Olanzapine - RLS, increased appetite, wt gain Perphenazine Loxapine - severe adverse effects at low dose. Had weakness, twitches BuSpar -ineffective Prozac -effective and then stopped working Zoloft-could not tolerate Lexapro -has used short-term to alleviate depressive signs and symptoms Remeron  Wellbutrin Vistaril - ineffective Requip - takes due to restless legs secondary to Seroquel .  Has taken long-term and reports being on higher doses in the past.  Denies correlation between Requip  and gambling. Pramipexole NAC Deplin Buprenorphine  Namenda  Verapamil  Pindolol Isradapine Clonidine  Lunesta-ineffective Belsomra- ineffective Dayvigo - Legs "buckled up." Ambien  Ambien  CR-in effective Trazodone-nightmares Doxepin - ineffective Remeron   Allergies: Codeine; Procaine hcl; Epinephrine ; Ozempic  (0.25 or 0.5 mg-dose) [semaglutide (0.25 or 0.5mg -dos)]; Aspirin ; Augmentin  [amoxicillin -pot clavulanate]; Benadryl  [diphenhydramine ]; Compazine  [prochlorperazine ]; Diflucan  [fluconazole ]; Dilaudid  [hydromorphone  hcl]; Hydromorphone ; Hydroxyzine ; Morphine ; Other; Prednisone ; Tramadol  hcl; Antihistamines, loratadine -type; Macrobid  [nitrofurantoin ]; and Sulfa antibiotics  Current Medications:  Current Outpatient Medications:    acetaminophen  (TYLENOL ) 500 MG tablet, Take 1 tablet (500 mg total) by mouth every 6 (six) hours as needed., Disp: 30 tablet, Rfl: 0    albuterol  (VENTOLIN  HFA) 108 (90 Base) MCG/ACT inhaler, Inhale 1-2 puffs into the lungs every 6 (six) hours as needed for wheezing or shortness of breath., Disp: , Rfl:    atorvastatin  (LIPITOR) 40 MG tablet, Take 1 tablet (40 mg total) by mouth See admin instructions. Take 40 mg by mouth at 5 PM, Disp: , Rfl: 0  b complex vitamins capsule, Take 1 capsule by mouth daily., Disp: , Rfl:    bismuth subsalicylate (PEPTO BISMOL) 262 MG/15ML suspension, Take 30 mLs by mouth every 6 (six) hours as needed., Disp: , Rfl:    calcium  carbonate (TUMS EX) 750 MG chewable tablet, Chew 1 tablet by mouth as needed., Disp: , Rfl:    cholecalciferol  (VITAMIN D3) 25 MCG (1000 UNIT) tablet, Take 1,000-2,000 Units by mouth See admin instructions. Take 1,000 units by mouth in the morning and 2,000 units at 5 PM, Disp: , Rfl:    Continuous Glucose Receiver (FREESTYLE LIBRE 3 READER) DEVI, 1 each by Does not apply route continuous. To use with freestyle libre 3+ sensor., Disp: 1 each, Rfl: 0   Continuous Glucose Sensor (FREESTYLE LIBRE 3 PLUS SENSOR) MISC, 1 each by Does not apply route continuous. Change every 15 days., Disp: 6 each, Rfl: 3   CONTOUR NEXT TEST test strip, USE TO TEST BLOOD SUGAR TWICE DAILY, Disp: 100 strip, Rfl: 3   cyclobenzaprine  (FLEXERIL ) 5 MG tablet, Take 5-10 mg by mouth every 8 (eight) hours as needed., Disp: , Rfl:    diclofenac  (VOLTAREN ) 75 MG EC tablet, Take 75 mg by mouth as needed., Disp: , Rfl:    divalproex  (DEPAKOTE ) 250 MG DR tablet, TAKE 2 TABLETS(500 MG) BY MOUTH AT BEDTIME, Disp: 180 tablet, Rfl: 0   ezetimibe  (ZETIA ) 10 MG tablet, Take 1 tablet (10 mg total) by mouth every evening., Disp: , Rfl:    fluticasone  (FLONASE ) 50 MCG/ACT nasal spray, Place 1 spray into both nostrils 2 (two) times daily as needed for allergies or rhinitis., Disp: , Rfl:    FUROSEMIDE  PO, Take by mouth as needed., Disp: , Rfl:    insulin  glargine (LANTUS  SOLOSTAR) 100 UNIT/ML Solostar Pen, Inject 56 Units into  the skin daily., Disp: 30 mL, Rfl: 4   insulin  lispro (HUMALOG  KWIKPEN) 100 UNIT/ML KwikPen, Inject 14-18 Units into the skin 2 (two) times daily before a meal., Disp: 15 mL, Rfl: 1   Insulin  Pen Needle (BD PEN NEEDLE NANO 2ND GEN) 32G X 4 MM MISC, Use to Inject 5 times daily as directed, Disp: 300 each, Rfl: 3   levothyroxine  (SYNTHROID ) 75 MCG tablet, Take 1 tablet (75 mcg total) by mouth daily before breakfast., Disp: 90 tablet, Rfl: 3   lidocaine  (XYLOCAINE ) 2 % solution, Use as directed 15 mLs in the mouth or throat as directed., Disp: , Rfl:    LINZESS 145 MCG CAPS capsule, TAKE 1 CAPSULE BY MOUTH DAILY 30 MINUTES BEFORE FIRST MEAL OF THE DAY ON AN EMPTY STOMACH for 30, Disp: , Rfl:    liothyronine  (CYTOMEL ) 5 MCG tablet, TAKE 1 TABLET BY MOUTH DAILY WITH LEVOTHYROXINE   TAKE BEFORE BREAKFAST, Disp: 90 tablet, Rfl: 3   loperamide  (IMODIUM ) 2 MG capsule, Take 2 capsules (4 mg total) by mouth 3 (three) times daily as needed for diarrhea or loose stools., Disp: 30 capsule, Rfl: 0   losartan  (COZAAR ) 50 MG tablet, Take 50 mg by mouth See admin instructions., Disp: , Rfl:    Microlet Lancets MISC, TEST DAILY AS DIRECTED, Disp: 100 each, Rfl: 3   ondansetron  (ZOFRAN ) 4 MG tablet, Take 1 tablet (4 mg total) by mouth every 6 (six) hours. (Patient taking differently: Take 4 mg by mouth every 6 (six) hours as needed for nausea or vomiting.), Disp: 12 tablet, Rfl: 0   oxyCODONE -acetaminophen  (PERCOCET) 7.5-325 MG tablet, Take 1 tablet by mouth every 8 (eight) hours as needed.,  Disp: , Rfl:    pantoprazole  (PROTONIX ) 40 MG tablet, Take 1 tablet (40 mg total) by mouth daily at 6 (six) AM., Disp: 30 tablet, Rfl: 0   rOPINIRole  (REQUIP ) 0.5 MG tablet, TAKE 4 TABLETS BY MOUTH AT 5PM, 3 TABLETS BY MOUTH AT BEDTIME, AND 1-2 TABLETS DAILY AS NEEDED, Disp: 720 tablet, Rfl: 0   Wheat Dextrin (BENEFIBER PO), Take 4-5 g by mouth daily as needed (for constipation- mix as directed)., Disp: , Rfl:    dicyclomine  (BENTYL )  20 MG tablet, Take 1 tablet (20 mg total) by mouth 3 (three) times daily as needed for spasms. (Patient not taking: Reported on 08/13/2023), Disp: 20 tablet, Rfl: 0   gabapentin  (NEURONTIN ) 600 MG tablet, Take 600 mg by mouth 3 (three) times daily. Take 600 mg by mouth in the morning, at 5 PM, and at bedtime, Disp: , Rfl:    ibuprofen  (ADVIL ) 600 MG tablet, Take 1 tablet (600 mg total) by mouth 4 (four) times daily. (Patient not taking: Reported on 08/13/2023), Disp: 20 tablet, Rfl: 0   Ipratropium-Albuterol  (COMBIVENT  RESPIMAT) 20-100 MCG/ACT AERS respimat, Inhale 1 puff into the lungs in the morning, at noon, and at bedtime for 7 days, THEN 1 puff every 6 (six) hours as needed for wheezing. (Patient not taking: Reported on 11/16/2022), Disp: 1 each, Rfl: 0   loratadine  (CLARITIN ) 10 MG tablet, Take 10 mg by mouth daily as needed for allergies. (Patient not taking: Reported on 08/13/2023), Disp: , Rfl:    methocarbamol  (ROBAXIN ) 750 MG tablet, Take 1 tablet (750 mg total) by mouth 3 (three) times daily as needed (muscle spasm/pain). (Patient not taking: Reported on 08/13/2023), Disp: 15 tablet, Rfl: 0   nitroGLYCERIN  (NITROSTAT ) 0.4 MG SL tablet, Place 1 tablet (0.4 mg total) under the tongue every 5 (five) minutes as needed for chest pain. (Patient not taking: Reported on 08/13/2023), Disp: 25 tablet, Rfl: 2 Medication Side Effects: none  Family Medical/ Social History: Changes? No  MENTAL HEALTH EXAM:  There were no vitals taken for this visit.There is no height or weight on file to calculate BMI.  General Appearance: Casual and Well Groomed  Eye Contact:  Good  Speech:  Clear and Coherent and Normal Rate  Volume:  Normal  Mood:  Euthymic  Affect:  Congruent  Thought Process:  Goal Directed and Descriptions of Associations: Circumstantial  Orientation:  Full (Time, Place, and Person)  Thought Content: Logical   Suicidal Thoughts:  No  Homicidal Thoughts:  No  Memory:  WNL  Judgement:  Good   Insight:  Good  Psychomotor Activity:  walks slowly, stooped over, with the help of her husband  Concentration:  Concentration: Good  Recall:  Good  Fund of Knowledge: Good  Language: Good  Assets:  Communication Skills Desire for Improvement Financial Resources/Insurance Housing Resilience Social Support Transportation  ADL's:  Intact  Cognition: WNL  Prognosis:  Good   DIAGNOSES:    ICD-10-CM   1. Bipolar 1 disorder ([name])  F31.9 Valproic acid  level    CBC with Differential/Platelet    Comprehensive metabolic panel    2. Restless leg syndrome  G25.81     3. Generalized anxiety disorder  F41.1     4. Primary insomnia  F51.01     5. Encounter for long-term (current) use of medications  Z79.899 Valproic acid  level    CBC with Differential/Platelet    Comprehensive metabolic panel     Receiving Psychotherapy: Yes  Deb Young  RECOMMENDATIONS:  PDMP reviewed.  Oxycodone  filled 07/15/2023.  Gabapentin  filled 06/17/2023.  2 Valium  were given 04/22/2023.  Xanax  was last filled 12/31/2022. I provided 35 minutes of face to face time during this encounter, including time spent before and after the visit in records review, medical decision making, counseling pertinent to today's visit, and charting.   She's doing well w/ mental health meds so no changes needed at this time. Will order labs, she can have them drawn at upcoming visit w/ PCP.   Continue Depakote  250 mg, 2 at bedtime. Continue gabapentin  600 mg, 1 po tid. Continue ropinerole 0.5 mg,4 po at 5 PM, 3 po at bedtime, and 1-2 during day prn.  Labs ordered as noted above.  Continue counseling with Deb young.  Return in 3 months.  Marvia Slocumb, PA-C

## 2023-08-16 DIAGNOSIS — F319 Bipolar disorder, unspecified: Secondary | ICD-10-CM | POA: Diagnosis not present

## 2023-08-16 DIAGNOSIS — Z794 Long term (current) use of insulin: Secondary | ICD-10-CM | POA: Diagnosis not present

## 2023-08-16 DIAGNOSIS — E119 Type 2 diabetes mellitus without complications: Secondary | ICD-10-CM | POA: Diagnosis not present

## 2023-08-16 DIAGNOSIS — M47892 Other spondylosis, cervical region: Secondary | ICD-10-CM | POA: Diagnosis not present

## 2023-08-16 DIAGNOSIS — M7918 Myalgia, other site: Secondary | ICD-10-CM | POA: Diagnosis not present

## 2023-08-16 DIAGNOSIS — M4726 Other spondylosis with radiculopathy, lumbar region: Secondary | ICD-10-CM | POA: Diagnosis not present

## 2023-08-18 ENCOUNTER — Emergency Department (HOSPITAL_COMMUNITY)

## 2023-08-18 ENCOUNTER — Other Ambulatory Visit: Payer: Self-pay

## 2023-08-18 ENCOUNTER — Emergency Department (HOSPITAL_COMMUNITY)
Admission: EM | Admit: 2023-08-18 | Discharge: 2023-08-18 | Disposition: A | Discharge status: Home / Self Care | Attending: Emergency Medicine | Admitting: Emergency Medicine

## 2023-08-18 DIAGNOSIS — R059 Cough, unspecified: Secondary | ICD-10-CM | POA: Diagnosis not present

## 2023-08-18 DIAGNOSIS — E042 Nontoxic multinodular goiter: Secondary | ICD-10-CM | POA: Diagnosis not present

## 2023-08-18 DIAGNOSIS — I517 Cardiomegaly: Secondary | ICD-10-CM | POA: Diagnosis not present

## 2023-08-18 DIAGNOSIS — Z794 Long term (current) use of insulin: Secondary | ICD-10-CM | POA: Diagnosis not present

## 2023-08-18 DIAGNOSIS — S3992XA Unspecified injury of lower back, initial encounter: Secondary | ICD-10-CM | POA: Diagnosis not present

## 2023-08-18 DIAGNOSIS — W010XXA Fall on same level from slipping, tripping and stumbling without subsequent striking against object, initial encounter: Secondary | ICD-10-CM | POA: Insufficient documentation

## 2023-08-18 DIAGNOSIS — I6529 Occlusion and stenosis of unspecified carotid artery: Secondary | ICD-10-CM | POA: Diagnosis not present

## 2023-08-18 DIAGNOSIS — S0990XA Unspecified injury of head, initial encounter: Secondary | ICD-10-CM | POA: Diagnosis not present

## 2023-08-18 DIAGNOSIS — M545 Low back pain, unspecified: Secondary | ICD-10-CM | POA: Insufficient documentation

## 2023-08-18 DIAGNOSIS — S299XXA Unspecified injury of thorax, initial encounter: Secondary | ICD-10-CM | POA: Diagnosis not present

## 2023-08-18 DIAGNOSIS — M25562 Pain in left knee: Secondary | ICD-10-CM | POA: Insufficient documentation

## 2023-08-18 DIAGNOSIS — W19XXXA Unspecified fall, initial encounter: Secondary | ICD-10-CM

## 2023-08-18 DIAGNOSIS — R918 Other nonspecific abnormal finding of lung field: Secondary | ICD-10-CM | POA: Diagnosis not present

## 2023-08-18 DIAGNOSIS — M542 Cervicalgia: Secondary | ICD-10-CM | POA: Insufficient documentation

## 2023-08-18 DIAGNOSIS — M25551 Pain in right hip: Secondary | ICD-10-CM | POA: Diagnosis not present

## 2023-08-18 DIAGNOSIS — Y9301 Activity, walking, marching and hiking: Secondary | ICD-10-CM | POA: Insufficient documentation

## 2023-08-18 DIAGNOSIS — I700 Atherosclerosis of aorta: Secondary | ICD-10-CM | POA: Diagnosis not present

## 2023-08-18 DIAGNOSIS — S80919A Unspecified superficial injury of unspecified knee, initial encounter: Secondary | ICD-10-CM | POA: Diagnosis not present

## 2023-08-18 DIAGNOSIS — M546 Pain in thoracic spine: Secondary | ICD-10-CM | POA: Diagnosis not present

## 2023-08-18 DIAGNOSIS — R0989 Other specified symptoms and signs involving the circulatory and respiratory systems: Secondary | ICD-10-CM | POA: Diagnosis not present

## 2023-08-18 DIAGNOSIS — Z96652 Presence of left artificial knee joint: Secondary | ICD-10-CM | POA: Insufficient documentation

## 2023-08-18 DIAGNOSIS — M7918 Myalgia, other site: Secondary | ICD-10-CM | POA: Diagnosis not present

## 2023-08-18 DIAGNOSIS — M47892 Other spondylosis, cervical region: Secondary | ICD-10-CM | POA: Diagnosis not present

## 2023-08-18 DIAGNOSIS — Z96641 Presence of right artificial hip joint: Secondary | ICD-10-CM | POA: Insufficient documentation

## 2023-08-18 DIAGNOSIS — M549 Dorsalgia, unspecified: Secondary | ICD-10-CM | POA: Diagnosis not present

## 2023-08-18 DIAGNOSIS — F319 Bipolar disorder, unspecified: Secondary | ICD-10-CM | POA: Diagnosis not present

## 2023-08-18 DIAGNOSIS — E119 Type 2 diabetes mellitus without complications: Secondary | ICD-10-CM | POA: Diagnosis not present

## 2023-08-18 DIAGNOSIS — S199XXA Unspecified injury of neck, initial encounter: Secondary | ICD-10-CM | POA: Diagnosis not present

## 2023-08-18 DIAGNOSIS — M25561 Pain in right knee: Secondary | ICD-10-CM | POA: Diagnosis not present

## 2023-08-18 DIAGNOSIS — M4726 Other spondylosis with radiculopathy, lumbar region: Secondary | ICD-10-CM | POA: Diagnosis not present

## 2023-08-18 MED ORDER — OXYCODONE-ACETAMINOPHEN 5-325 MG PO TABS
2.0000 | ORAL_TABLET | Freq: Once | ORAL | Status: AC
  Administered 2023-08-18: 2 via ORAL
  Filled 2023-08-18: qty 2

## 2023-08-18 NOTE — ED Triage Notes (Signed)
 Pt BIBA from home with c/o of an unwitnessed fall. Fell on back and right side. Pt complains of right-sided hip pain and neck pain. Pt presents in c-collar. Pt has chronic lower back pain and left knee pain. Denies LOC, did not hit her head, no blood thinners. Pt took oxycodone  7.5mg  at 0100.  BP 140/80-HR 94-RR 16-95% RA-CBG 165

## 2023-08-18 NOTE — ED Provider Notes (Signed)
 [name] AT [facility] Provider Note   CSN: [id number] Arrival date & time: 08/18/23  0124     History  Chief Complaint  Patient presents with   [name] [name] is a 78 y.o. female.  Patient with past medical history significant for osteoarthritis of the spine, right total hip arthroplasty, left total knee arthroplasty, chronic pain presents to the emergency room complaining of injury secondary to a fall.  Patient states she was walking to the bathroom when she slipped in her slippers falling she believes directly on her buttocks.  She cannot remember if she may have hit her head.  She complains of pain in the right hip, back, neck, and left knee.  She denies losing consciousness.  She denies blood thinner usage.   Fall       Home Medications Prior to Admission medications   Medication Sig Start Date End Date Taking? Authorizing Provider  acetaminophen  (TYLENOL ) 500 MG tablet Take 1 tablet (500 mg total) by mouth every 6 (six) hours as needed. 11/15/22   Deatra Face, MD  albuterol  (VENTOLIN  HFA) 108 (90 Base) MCG/ACT inhaler Inhale 1-2 puffs into the lungs every 6 (six) hours as needed for wheezing or shortness of breath.    [provider]  atorvastatin  (LIPITOR) 40 MG tablet Take 1 tablet (40 mg total) by mouth See admin instructions. Take 40 mg by mouth at 5 PM 06/12/22   Armenta Landau, MD  b complex vitamins capsule Take 1 capsule by mouth daily.    [provider]  bismuth subsalicylate (PEPTO BISMOL) 262 MG/15ML suspension Take 30 mLs by mouth every 6 (six) hours as needed.    [provider]  calcium  carbonate (TUMS EX) 750 MG chewable tablet Chew 1 tablet by mouth as needed.    [provider]  cholecalciferol  (VITAMIN D3) 25 MCG (1000 UNIT) tablet Take 1,000-2,000 Units by mouth See admin instructions. Take 1,000 units by mouth in the morning and 2,000 units at 5 PM    [provider]  Continuous Glucose Receiver (FREESTYLE LIBRE 3 READER) DEVI 1 each by Does not apply route continuous. To use with freestyle libre 3+ sensor. 05/14/23   Thapa, [place], MD  Continuous Glucose Sensor (FREESTYLE LIBRE 3 PLUS SENSOR) MISC 1 each by Does not apply route continuous. Change every 15 days. 05/14/23   Thapa, [place], MD  CONTOUR NEXT TEST test strip USE TO TEST BLOOD SUGAR TWICE DAILY 08/01/21   Lajean Pike, MD  cyclobenzaprine  (FLEXERIL ) 5 MG tablet Take 5-10 mg by mouth every 8 (eight) hours as needed. 10/30/22   [provider]  diclofenac  (VOLTAREN ) 75 MG EC tablet Take 75 mg by mouth as needed. 05/11/22   [provider]  dicyclomine  (BENTYL ) 20 MG tablet Take 1 tablet (20 mg total) by mouth 3 (three) times daily as needed for spasms. Patient not taking: Reported on 08/13/2023 06/17/21   Leona Rake, MD  divalproex  (DEPAKOTE ) 250 MG DR tablet TAKE 2 TABLETS(500 MG) BY MOUTH AT BEDTIME 08/06/23   Hurst, Ammon Bales T, PA-C  ezetimibe  (ZETIA ) 10 MG tablet Take 1 tablet (10 mg total) by mouth every evening. 06/12/22   Armenta Landau, MD  fluticasone  (FLONASE ) 50 MCG/ACT nasal spray Place 1 spray into both nostrils 2 (two) times daily as needed for allergies or rhinitis.    [provider]  FUROSEMIDE  PO Take by mouth as needed.    [provider]  gabapentin  (NEURONTIN ) 600 MG tablet Take 600 mg by mouth 3 (three) times daily. Take 600 mg by mouth in the morning, at 5 PM, and at bedtime 05/12/19   [provider]  ibuprofen  (ADVIL ) 600 MG tablet Take 1 tablet (600 mg total) by mouth 4 (four) times daily. Patient not taking: Reported on 08/13/2023 11/15/22   Deatra Face, MD  insulin  glargine (LANTUS  SOLOSTAR) 100 UNIT/ML Solostar Pen Inject 56 Units into the skin daily. 06/14/23   Thapa, [place], MD  insulin  lispro (HUMALOG  KWIKPEN) 100 UNIT/ML KwikPen Inject 14-18 Units into the skin 2 (two) times daily before a meal. 07/06/22   Lajean Pike, MD  Insulin  Pen  Needle (BD PEN NEEDLE NANO 2ND GEN) 32G X 4 MM MISC Use to Inject 5 times daily as directed 02/03/23   Thapa, [place], MD  Ipratropium-Albuterol  (COMBIVENT  RESPIMAT) 20-100 MCG/ACT AERS respimat Inhale 1 puff into the lungs in the morning, at noon, and at bedtime for 7 days, THEN 1 puff every 6 (six) hours as needed for wheezing. Patient not taking: Reported on 11/16/2022 06/05/22 07/12/22  Armenta Landau, MD  levothyroxine  (SYNTHROID ) 75 MCG tablet Take 1 tablet (75 mcg total) by mouth daily before breakfast. 12/28/22   Thapa, [place], MD  lidocaine  (XYLOCAINE ) 2 % solution Use as directed 15 mLs in the mouth or throat as directed. 06/01/22   [provider]  LINZESS 145 MCG CAPS capsule TAKE 1 CAPSULE BY MOUTH DAILY 30 MINUTES BEFORE FIRST MEAL OF THE DAY ON AN EMPTY STOMACH for 30 03/26/23   [provider]  liothyronine  (CYTOMEL ) 5 MCG tablet TAKE 1 TABLET BY MOUTH DAILY WITH LEVOTHYROXINE   TAKE BEFORE BREAKFAST 12/28/22   Thapa, [place], MD  loperamide  (IMODIUM ) 2 MG capsule Take 2 capsules (4 mg total) by mouth 3 (three) times daily as needed for diarrhea or loose stools. 11/12/21   Danford, Willis Harter, MD  loratadine  (CLARITIN ) 10 MG tablet Take 10 mg by mouth daily as needed for allergies. Patient not taking: Reported on 08/13/2023    [provider]  losartan  (COZAAR ) 50 MG tablet Take 50 mg by mouth See admin instructions. 08/31/19   [provider]  methocarbamol  (ROBAXIN ) 750 MG tablet Take 1 tablet (750 mg total) by mouth 3 (three) times daily as needed (muscle spasm/pain). Patient not taking: Reported on 08/13/2023 10/22/22   Guadalupe Lee, MD  Microlet Lancets MISC TEST DAILY AS DIRECTED 08/01/21   Lajean Pike, MD  nitroGLYCERIN  (NITROSTAT ) 0.4 MG SL tablet Place 1 tablet (0.4 mg total) under the tongue every 5 (five) minutes as needed for chest pain. Patient not taking: Reported on 08/13/2023 09/14/17   Ruddy Corral M, PA-C  ondansetron  (ZOFRAN ) 4 MG tablet Take  1 tablet (4 mg total) by mouth every 6 (six) hours. Patient taking differently: Take 4 mg by mouth every 6 (six) hours as needed for nausea or vomiting. 09/20/21   Curatolo, Adam, DO  oxyCODONE -acetaminophen  (PERCOCET) 7.5-325 MG tablet Take 1 tablet by mouth every 8 (eight) hours as needed. 02/25/23   [provider]  pantoprazole  (PROTONIX ) 40 MG tablet Take 1 tablet (40 mg total) by mouth daily at 6 (six) AM. 06/06/22   Armenta Landau, MD  rOPINIRole  (REQUIP ) 0.5 MG tablet TAKE 4 TABLETS BY MOUTH AT 5PM, 3 TABLETS BY MOUTH AT BEDTIME, AND 1-2 TABLETS DAILY AS NEEDED 07/25/23   Chet Cota, Ammon Bales T, PA-C  Wheat Dextrin (BENEFIBER PO) Take 4-5 g by mouth daily as needed (for constipation-  mix as directed).    [provider]      Allergies    Codeine; Procaine hcl; Epinephrine ; Ozempic  (0.25 or 0.5 mg-dose) [semaglutide (0.25 or 0.5mg -dos)]; Aspirin ; Augmentin  [amoxicillin -pot clavulanate]; Benadryl  [place] ]; Compazine  [prochlorperazine ]; Diflucan  [fluconazole ]; Dilaudid  [hydromorphone  hcl]; Hydromorphone ; Hydroxyzine ; Morphine ; Other; Prednisone ; Tramadol  hcl; Antihistamines, loratadine -type; Macrobid  [nitrofurantoin ]; and Sulfa antibiotics    Review of Systems   Review of Systems  Physical Exam Updated Vital Signs BP (!) 139/59   Pulse 71   Temp 98.6 F (37 C)   Resp 18   SpO2 91%  Physical Exam Vitals and nursing note reviewed.  Constitutional:      General: She is not in acute distress.    Appearance: She is well-developed.  HENT:     Head: Normocephalic and atraumatic.  Eyes:     Conjunctiva/sclera: Conjunctivae normal.  Neck:     Comments: Patient in c-collar upon arrival Cardiovascular:     Rate and Rhythm: Normal rate and regular rhythm.  Pulmonary:     Effort: Pulmonary effort is normal.     Breath sounds: Normal breath sounds.  Abdominal:     Palpations: Abdomen is soft.     Tenderness: There is no abdominal tenderness.  Musculoskeletal:         General: Tenderness present. No swelling or deformity. Normal range of motion.     Cervical back: Neck supple.     Comments: No rotation or shortening appreciated in right lower extremity.  Patient does complain of some pain in the groin with passive flexion of the right hip.  Patient also complains of some pain in the superior region of the left knee with passive flexion.  No obvious deformities noted.  She does complain of some tenderness to both thoracic and lumbar spinal regions.  Skin:    General: Skin is warm and dry.     Capillary Refill: Capillary refill takes less than 2 seconds.  Neurological:     Mental Status: She is alert.  Psychiatric:        Mood and Affect: Mood normal.     ED Results / Procedures / Treatments   Labs (all labs ordered are listed, but only abnormal results are displayed) Labs Reviewed - No data to display  EKG None  Radiology DG Chest 2 View Result Date: 08/18/2023 CLINICAL DATA:  Cough and congestion. EXAM: CHEST - 2 VIEW COMPARISON:  Portable chest 04/02/2023 FINDINGS: The heart is slightly enlarged. No vascular congestion is seen. There is a stable mediastinum with atherosclerosis tortuosity of the aorta. The lungs are clear with mild elevation of the right diaphragm. There is thoracic spondylosis and mild levoscoliosis. IMPRESSION: No evidence of acute chest disease. Slight cardiomegaly. Aortic atherosclerosis. Electronically Signed   By: Denman Fischer M.D.   On: 08/18/2023 04:26   CT Thoracic Spine Wo Contrast Result Date: 08/18/2023 CLINICAL DATA:  Back trauma. Fell on back and right side. Right-sided hip pain and neck pain. Chronic lower back pain. EXAM: CT THORACIC AND LUMBAR SPINE WITHOUT CONTRAST TECHNIQUE: Multidetector CT imaging of the thoracic and lumbar spine was performed without contrast. Multiplanar CT image reconstructions were also generated. RADIATION DOSE REDUCTION: This exam was performed according to the departmental  dose-optimization program which includes automated exposure control, adjustment of the mA and/or kV according to patient size and/or use of iterative reconstruction technique. COMPARISON:  Lumbar spine radiographs 05/21/2022 FINDINGS: CT THORACIC SPINE FINDINGS Alignment: No evidence of traumatic listhesis. Vertebrae: No acute fracture. Paraspinal and other soft tissues:  Ground-glass and consolidative opacities in the lingula suspicious for pneumonia. Cardiomegaly and diffuse interlobular septal thickening. 7 mm posteromedial left lower lobe pulmonary nodule on series 5/image 71. This is not substantially changed from 11/26/2021. No follow-up recommended. Disc levels: Age commensurate multilevel spondylosis. No severe spinal canal narrowing. CT LUMBAR SPINE FINDINGS Segmentation: 5 lumbar type vertebrae. Alignment: Left convex thoracolumbar curve. No evidence of traumatic listhesis. Vertebrae: No acute fracture. Paraspinal and other soft tissues: Aortic atherosclerotic calcification. No acute abnormality. Disc levels: Multilevel spondylosis, disc space height loss, degenerative endplate change which is advanced on the right at L1-L2. Diffuse disc bulge at L1-L2 causes moderate effacement of the ventral thecal sac. Posterior disc bulge at L3-L4 causes severe compression of the thecal sac. IMPRESSION: 1. No acute fracture or traumatic listhesis. 2. Ground-glass and consolidative opacities in the lingula suspicious for pneumonia. 3. Cardiomegaly and diffuse interlobular septal thickening suggesting pulmonary edema. 1. No acute fracture or traumatic listhesis. 2. Multilevel lumbar spondylosis with severe spinal canal narrowing at L3-L4. Electronically Signed   By: Rozell Cornet M.D.   On: 08/18/2023 03:25   CT Lumbar Spine Wo Contrast Result Date: 08/18/2023 CLINICAL DATA:  Back trauma. Fell on back and right side. Right-sided hip pain and neck pain. Chronic lower back pain. EXAM: CT THORACIC AND LUMBAR SPINE  WITHOUT CONTRAST TECHNIQUE: Multidetector CT imaging of the thoracic and lumbar spine was performed without contrast. Multiplanar CT image reconstructions were also generated. RADIATION DOSE REDUCTION: This exam was performed according to the departmental dose-optimization program which includes automated exposure control, adjustment of the mA and/or kV according to patient size and/or use of iterative reconstruction technique. COMPARISON:  Lumbar spine radiographs 05/21/2022 FINDINGS: CT THORACIC SPINE FINDINGS Alignment: No evidence of traumatic listhesis. Vertebrae: No acute fracture. Paraspinal and other soft tissues: Ground-glass and consolidative opacities in the lingula suspicious for pneumonia. Cardiomegaly and diffuse interlobular septal thickening. 7 mm posteromedial left lower lobe pulmonary nodule on series 5/image 71. This is not substantially changed from 11/26/2021. No follow-up recommended. Disc levels: Age commensurate multilevel spondylosis. No severe spinal canal narrowing. CT LUMBAR SPINE FINDINGS Segmentation: 5 lumbar type vertebrae. Alignment: Left convex thoracolumbar curve. No evidence of traumatic listhesis. Vertebrae: No acute fracture. Paraspinal and other soft tissues: Aortic atherosclerotic calcification. No acute abnormality. Disc levels: Multilevel spondylosis, disc space height loss, degenerative endplate change which is advanced on the right at L1-L2. Diffuse disc bulge at L1-L2 causes moderate effacement of the ventral thecal sac. Posterior disc bulge at L3-L4 causes severe compression of the thecal sac. IMPRESSION: 1. No acute fracture or traumatic listhesis. 2. Ground-glass and consolidative opacities in the lingula suspicious for pneumonia. 3. Cardiomegaly and diffuse interlobular septal thickening suggesting pulmonary edema. 1. No acute fracture or traumatic listhesis. 2. Multilevel lumbar spondylosis with severe spinal canal narrowing at L3-L4. Electronically Signed   By:  Rozell Cornet M.D.   On: 08/18/2023 03:25   CT Head Wo Contrast Result Date: 08/18/2023 CLINICAL DATA:  Unwitnessed fall. Landed on back and right side. Right-sided hip pain and neck pain. Patient in C-collar. EXAM: CT HEAD WITHOUT CONTRAST CT CERVICAL SPINE WITHOUT CONTRAST TECHNIQUE: Multidetector CT imaging of the head and cervical spine was performed following the standard protocol without intravenous contrast. Multiplanar CT image reconstructions of the cervical spine were also generated. RADIATION DOSE REDUCTION: This exam was performed according to the departmental dose-optimization program which includes automated exposure control, adjustment of the mA and/or kV according to patient size and/or use of iterative reconstruction  technique. COMPARISON:  CT head 03/02/2023 and CT cervical spine 01/22/2015 FINDINGS: CT HEAD FINDINGS Brain: No evidence of acute infarction, hemorrhage, hydrocephalus, extra-axial collection or mass lesion/mass effect. Vascular: No hyperdense vessel or unexpected calcification. Skull: No fracture or focal lesion. Sinuses/Orbits: No acute finding. Other: None. CT CERVICAL SPINE FINDINGS Alignment: No evidence of traumatic malalignment. Skull base and vertebrae: No acute fracture. No primary bone lesion or focal pathologic process. Soft tissues and spinal canal: No prevertebral fluid or swelling. No visible canal hematoma. Disc levels: Multilevel facet arthropathy and spondylosis commensurate with age. No severe spinal canal narrowing. Upper chest: Bilateral thyroid  nodules previously evaluated with ultrasound 01/01/2023. Other: Carotid calcification. IMPRESSION: 1. No acute intracranial abnormality. 2. No acute fracture in the cervical spine. Electronically Signed   By: Rozell Cornet M.D.   On: 08/18/2023 03:15   CT Cervical Spine Wo Contrast Result Date: 08/18/2023 CLINICAL DATA:  Unwitnessed fall. Landed on back and right side. Right-sided hip pain and neck pain. Patient in  C-collar. EXAM: CT HEAD WITHOUT CONTRAST CT CERVICAL SPINE WITHOUT CONTRAST TECHNIQUE: Multidetector CT imaging of the head and cervical spine was performed following the standard protocol without intravenous contrast. Multiplanar CT image reconstructions of the cervical spine were also generated. RADIATION DOSE REDUCTION: This exam was performed according to the departmental dose-optimization program which includes automated exposure control, adjustment of the mA and/or kV according to patient size and/or use of iterative reconstruction technique. COMPARISON:  CT head 03/02/2023 and CT cervical spine 01/22/2015 FINDINGS: CT HEAD FINDINGS Brain: No evidence of acute infarction, hemorrhage, hydrocephalus, extra-axial collection or mass lesion/mass effect. Vascular: No hyperdense vessel or unexpected calcification. Skull: No fracture or focal lesion. Sinuses/Orbits: No acute finding. Other: None. CT CERVICAL SPINE FINDINGS Alignment: No evidence of traumatic malalignment. Skull base and vertebrae: No acute fracture. No primary bone lesion or focal pathologic process. Soft tissues and spinal canal: No prevertebral fluid or swelling. No visible canal hematoma. Disc levels: Multilevel facet arthropathy and spondylosis commensurate with age. No severe spinal canal narrowing. Upper chest: Bilateral thyroid  nodules previously evaluated with ultrasound 01/01/2023. Other: Carotid calcification. IMPRESSION: 1. No acute intracranial abnormality. 2. No acute fracture in the cervical spine. Electronically Signed   By: Rozell Cornet M.D.   On: 08/18/2023 03:15   DG Hip Unilat W or Wo Pelvis 2-3 Views Right Result Date: 08/18/2023 CLINICAL DATA:  Right hip pain following fall, initial encounter EXAM: DG HIP (WITH OR WITHOUT PELVIS) 3V RIGHT COMPARISON:  None Available. FINDINGS: Pelvic ring is intact. Right hip replacement is noted. No acute fracture or dislocation is noted. No soft tissue changes are seen. IMPRESSION: No  acute abnormality noted. Electronically Signed   By: Violeta Grey M.D.   On: 08/18/2023 02:28   DG Knee 2 Views Left Result Date: 08/18/2023 CLINICAL DATA:  Recent fall with left knee pain, initial encounter EXAM: LEFT KNEE - 2 VIEW COMPARISON:  None Available. FINDINGS: Left knee replacement is noted. No acute fracture or dislocation is noted. No soft tissue changes are seen. IMPRESSION: No acute abnormality noted. Electronically Signed   By: Violeta Grey M.D.   On: 08/18/2023 02:26    Procedures Procedures    Medications Ordered in ED Medications  oxyCODONE -acetaminophen  (PERCOCET/ROXICET) 5-325 MG per tablet 2 tablet (2 tablets Oral Given 08/18/23 0303)    ED Course/ Medical Decision Making/ A&P  Medical Decision Making Amount and/or Complexity of Data Reviewed Radiology: ordered.   This patient presents to the ED for concern of pain post fall, this involves an extensive number of treatment options, and is a complaint that carries with it a high risk of complications and morbidity.  The differential diagnosis includes fracture, dislocation,soft tissue injury, intracranial abnormality, others   Co morbidities / Chronic conditions that complicate the patient evaluation  Chronic pain   Additional history obtained:  Additional history obtained from EMR External records from outside source obtained and reviewed including Behavioral health note   Imaging Studies ordered:  I ordered imaging studies including CT scans of the head, cervical spine, lumbar spine, thoracic spine; plain films of the left knee, right hip, and chest I independently visualized and interpreted imaging which showed no acute findings on plain films including no pneumonia.No acute findings on head CT or cervical spine CT.  Thoracic and lumbar CTs show: 1. No acute fracture or traumatic listhesis.  2. Ground-glass and consolidative opacities in the lingula  suspicious for  pneumonia.  3. Cardiomegaly and diffuse interlobular septal thickening  suggesting pulmonary edema.    1. No acute fracture or traumatic listhesis.  2. Multilev   I agree with the radiologist interpretation   Problem List / ED Course / Critical interventions / Medication management   I ordered medication including oxycodone  Reevaluation of the patient after these medicines showed that the patient improved I have reviewed the patients home medicines and have made adjustments as needed   Test / Admission - Considered:  No acute traumatic findings on imaging.  Patient with history of chronic pain, likely flare of underlying chronic pain.  CT scan did show findings concerning for possible pneumonia but chest x-ray was negative.  Lungs are clear to auscultation bilaterally.  Patient is denying shortness of breath, cough, fever.  Clinically this does not appear to be pneumonia.  She ambulated and maintained SpO2 while walking.  I see no indication for further emergent workup or admission at this time.  Patient stable for discharge.  Return precautions provided.         Final Clinical Impression(s) / ED Diagnoses Final diagnoses:  Fall, initial encounter    Rx / DC Orders ED Discharge Orders     None         Delories Fetter 08/18/23 0458    Eldon Greenland, MD 08/18/23 [id number]

## 2023-08-18 NOTE — Discharge Instructions (Signed)
 Your workup tonight was reassuring.  You may continue your home pain medications as prescribed.  Follow-up with your primary care provider.  If you develop any life-threatening symptoms return to the emergency department.

## 2023-08-20 DIAGNOSIS — M5459 Other low back pain: Secondary | ICD-10-CM | POA: Diagnosis not present

## 2023-08-20 DIAGNOSIS — Z79891 Long term (current) use of opiate analgesic: Secondary | ICD-10-CM | POA: Diagnosis not present

## 2023-08-20 DIAGNOSIS — G894 Chronic pain syndrome: Secondary | ICD-10-CM | POA: Diagnosis not present

## 2023-08-20 DIAGNOSIS — Z5181 Encounter for therapeutic drug level monitoring: Secondary | ICD-10-CM | POA: Diagnosis not present

## 2023-08-21 DIAGNOSIS — Z87440 Personal history of urinary (tract) infections: Secondary | ICD-10-CM | POA: Diagnosis not present

## 2023-08-21 DIAGNOSIS — Z9089 Acquired absence of other organs: Secondary | ICD-10-CM | POA: Diagnosis not present

## 2023-08-21 DIAGNOSIS — Z79890 Hormone replacement therapy (postmenopausal): Secondary | ICD-10-CM | POA: Diagnosis not present

## 2023-08-21 DIAGNOSIS — Z90710 Acquired absence of both cervix and uterus: Secondary | ICD-10-CM | POA: Diagnosis not present

## 2023-08-21 DIAGNOSIS — M4726 Other spondylosis with radiculopathy, lumbar region: Secondary | ICD-10-CM | POA: Diagnosis not present

## 2023-08-21 DIAGNOSIS — L932 Other local lupus erythematosus: Secondary | ICD-10-CM | POA: Diagnosis not present

## 2023-08-21 DIAGNOSIS — M47892 Other spondylosis, cervical region: Secondary | ICD-10-CM | POA: Diagnosis not present

## 2023-08-21 DIAGNOSIS — Z96652 Presence of left artificial knee joint: Secondary | ICD-10-CM | POA: Diagnosis not present

## 2023-08-21 DIAGNOSIS — Z96611 Presence of right artificial shoulder joint: Secondary | ICD-10-CM | POA: Diagnosis not present

## 2023-08-21 DIAGNOSIS — F319 Bipolar disorder, unspecified: Secondary | ICD-10-CM | POA: Diagnosis not present

## 2023-08-21 DIAGNOSIS — Z96641 Presence of right artificial hip joint: Secondary | ICD-10-CM | POA: Diagnosis not present

## 2023-08-21 DIAGNOSIS — Z79891 Long term (current) use of opiate analgesic: Secondary | ICD-10-CM | POA: Diagnosis not present

## 2023-08-21 DIAGNOSIS — Z794 Long term (current) use of insulin: Secondary | ICD-10-CM | POA: Diagnosis not present

## 2023-08-21 DIAGNOSIS — M7918 Myalgia, other site: Secondary | ICD-10-CM | POA: Diagnosis not present

## 2023-08-21 DIAGNOSIS — E119 Type 2 diabetes mellitus without complications: Secondary | ICD-10-CM | POA: Diagnosis not present

## 2023-08-21 DIAGNOSIS — M799 Soft tissue disorder, unspecified: Secondary | ICD-10-CM | POA: Diagnosis not present

## 2023-08-21 DIAGNOSIS — Z604 Social exclusion and rejection: Secondary | ICD-10-CM | POA: Diagnosis not present

## 2023-08-21 DIAGNOSIS — I10 Essential (primary) hypertension: Secondary | ICD-10-CM | POA: Diagnosis not present

## 2023-08-21 DIAGNOSIS — Z9989 Dependence on other enabling machines and devices: Secondary | ICD-10-CM | POA: Diagnosis not present

## 2023-08-21 DIAGNOSIS — K589 Irritable bowel syndrome without diarrhea: Secondary | ICD-10-CM | POA: Diagnosis not present

## 2023-08-21 DIAGNOSIS — M419 Scoliosis, unspecified: Secondary | ICD-10-CM | POA: Diagnosis not present

## 2023-08-23 DIAGNOSIS — Z794 Long term (current) use of insulin: Secondary | ICD-10-CM | POA: Diagnosis not present

## 2023-08-23 DIAGNOSIS — M47892 Other spondylosis, cervical region: Secondary | ICD-10-CM | POA: Diagnosis not present

## 2023-08-23 DIAGNOSIS — M4726 Other spondylosis with radiculopathy, lumbar region: Secondary | ICD-10-CM | POA: Diagnosis not present

## 2023-08-23 DIAGNOSIS — M7918 Myalgia, other site: Secondary | ICD-10-CM | POA: Diagnosis not present

## 2023-08-23 DIAGNOSIS — F319 Bipolar disorder, unspecified: Secondary | ICD-10-CM | POA: Diagnosis not present

## 2023-08-23 DIAGNOSIS — E119 Type 2 diabetes mellitus without complications: Secondary | ICD-10-CM | POA: Diagnosis not present

## 2023-08-24 DIAGNOSIS — M47892 Other spondylosis, cervical region: Secondary | ICD-10-CM | POA: Diagnosis not present

## 2023-08-24 DIAGNOSIS — E119 Type 2 diabetes mellitus without complications: Secondary | ICD-10-CM | POA: Diagnosis not present

## 2023-08-24 DIAGNOSIS — M4726 Other spondylosis with radiculopathy, lumbar region: Secondary | ICD-10-CM | POA: Diagnosis not present

## 2023-08-24 DIAGNOSIS — F319 Bipolar disorder, unspecified: Secondary | ICD-10-CM | POA: Diagnosis not present

## 2023-08-24 DIAGNOSIS — Z794 Long term (current) use of insulin: Secondary | ICD-10-CM | POA: Diagnosis not present

## 2023-08-24 DIAGNOSIS — M7918 Myalgia, other site: Secondary | ICD-10-CM | POA: Diagnosis not present

## 2023-08-27 DIAGNOSIS — F319 Bipolar disorder, unspecified: Secondary | ICD-10-CM | POA: Diagnosis not present

## 2023-08-27 DIAGNOSIS — M7918 Myalgia, other site: Secondary | ICD-10-CM | POA: Diagnosis not present

## 2023-08-27 DIAGNOSIS — M47892 Other spondylosis, cervical region: Secondary | ICD-10-CM | POA: Diagnosis not present

## 2023-08-27 DIAGNOSIS — Z794 Long term (current) use of insulin: Secondary | ICD-10-CM | POA: Diagnosis not present

## 2023-08-27 DIAGNOSIS — M4726 Other spondylosis with radiculopathy, lumbar region: Secondary | ICD-10-CM | POA: Diagnosis not present

## 2023-08-27 DIAGNOSIS — E119 Type 2 diabetes mellitus without complications: Secondary | ICD-10-CM | POA: Diagnosis not present

## 2023-08-30 ENCOUNTER — Ambulatory Visit (INDEPENDENT_AMBULATORY_CARE_PROVIDER_SITE_OTHER): Admitting: Endocrinology

## 2023-08-30 ENCOUNTER — Encounter: Payer: Self-pay | Admitting: Endocrinology

## 2023-08-30 ENCOUNTER — Telehealth: Payer: Self-pay | Admitting: Physician Assistant

## 2023-08-30 VITALS — BP 134/70 | HR 80 | Resp 20 | Ht 60.0 in | Wt 161.2 lb

## 2023-08-30 DIAGNOSIS — E039 Hypothyroidism, unspecified: Secondary | ICD-10-CM | POA: Diagnosis not present

## 2023-08-30 DIAGNOSIS — Z794 Long term (current) use of insulin: Secondary | ICD-10-CM

## 2023-08-30 DIAGNOSIS — E042 Nontoxic multinodular goiter: Secondary | ICD-10-CM

## 2023-08-30 DIAGNOSIS — E1169 Type 2 diabetes mellitus with other specified complication: Secondary | ICD-10-CM

## 2023-08-30 MED ORDER — FREESTYLE LIBRE 3 PLUS SENSOR MISC: Status: AC

## 2023-08-30 NOTE — Telephone Encounter (Signed)
 Patient advised of Teresa's recommendation.

## 2023-08-30 NOTE — Telephone Encounter (Addendum)
 Please see message and advise. Will send Rx as appropriate.
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
## 2023-08-30 NOTE — Telephone Encounter (Signed)
 Pt called at 9:10a stating that her pain management doc wants her to start Cymbalta.  She wants to know what Verneita thinks.  Next appt 9/3

## 2023-08-30 NOTE — Telephone Encounter (Signed)
 I think it is a great idea and recommend she take it as per their directions.

## 2023-08-31 ENCOUNTER — Encounter: Payer: Self-pay | Admitting: Endocrinology

## 2023-08-31 DIAGNOSIS — Z794 Long term (current) use of insulin: Secondary | ICD-10-CM | POA: Diagnosis not present

## 2023-08-31 DIAGNOSIS — F319 Bipolar disorder, unspecified: Secondary | ICD-10-CM | POA: Diagnosis not present

## 2023-08-31 DIAGNOSIS — M47892 Other spondylosis, cervical region: Secondary | ICD-10-CM | POA: Diagnosis not present

## 2023-08-31 DIAGNOSIS — E119 Type 2 diabetes mellitus without complications: Secondary | ICD-10-CM | POA: Diagnosis not present

## 2023-08-31 DIAGNOSIS — M4726 Other spondylosis with radiculopathy, lumbar region: Secondary | ICD-10-CM | POA: Diagnosis not present

## 2023-08-31 DIAGNOSIS — M7918 Myalgia, other site: Secondary | ICD-10-CM | POA: Diagnosis not present

## 2023-09-01 DIAGNOSIS — F319 Bipolar disorder, unspecified: Secondary | ICD-10-CM | POA: Diagnosis not present

## 2023-09-01 DIAGNOSIS — M7918 Myalgia, other site: Secondary | ICD-10-CM | POA: Diagnosis not present

## 2023-09-01 DIAGNOSIS — E119 Type 2 diabetes mellitus without complications: Secondary | ICD-10-CM | POA: Diagnosis not present

## 2023-09-01 DIAGNOSIS — Z794 Long term (current) use of insulin: Secondary | ICD-10-CM | POA: Diagnosis not present

## 2023-09-01 DIAGNOSIS — M4726 Other spondylosis with radiculopathy, lumbar region: Secondary | ICD-10-CM | POA: Diagnosis not present

## 2023-09-01 DIAGNOSIS — M47892 Other spondylosis, cervical region: Secondary | ICD-10-CM | POA: Diagnosis not present

## 2023-09-03 DIAGNOSIS — F319 Bipolar disorder, unspecified: Secondary | ICD-10-CM | POA: Diagnosis not present

## 2023-09-03 DIAGNOSIS — M4726 Other spondylosis with radiculopathy, lumbar region: Secondary | ICD-10-CM | POA: Diagnosis not present

## 2023-09-03 DIAGNOSIS — M47892 Other spondylosis, cervical region: Secondary | ICD-10-CM | POA: Diagnosis not present

## 2023-09-03 DIAGNOSIS — Z794 Long term (current) use of insulin: Secondary | ICD-10-CM | POA: Diagnosis not present

## 2023-09-03 DIAGNOSIS — M7918 Myalgia, other site: Secondary | ICD-10-CM | POA: Diagnosis not present

## 2023-09-03 DIAGNOSIS — E119 Type 2 diabetes mellitus without complications: Secondary | ICD-10-CM | POA: Diagnosis not present

## 2023-09-06 ENCOUNTER — Other Ambulatory Visit: Payer: Self-pay

## 2023-09-06 DIAGNOSIS — E039 Hypothyroidism, unspecified: Secondary | ICD-10-CM

## 2023-09-06 DIAGNOSIS — E042 Nontoxic multinodular goiter: Secondary | ICD-10-CM

## 2023-09-09 ENCOUNTER — Encounter: Payer: Self-pay | Admitting: Endocrinology

## 2023-09-09 DIAGNOSIS — M51362 Other intervertebral disc degeneration, lumbar region with discogenic back pain and lower extremity pain: Secondary | ICD-10-CM | POA: Diagnosis not present

## 2023-09-09 DIAGNOSIS — Z5181 Encounter for therapeutic drug level monitoring: Secondary | ICD-10-CM | POA: Diagnosis not present

## 2023-09-09 DIAGNOSIS — G894 Chronic pain syndrome: Secondary | ICD-10-CM | POA: Diagnosis not present

## 2023-09-09 DIAGNOSIS — Z79891 Long term (current) use of opiate analgesic: Secondary | ICD-10-CM | POA: Diagnosis not present

## 2023-09-09 DIAGNOSIS — M5459 Other low back pain: Secondary | ICD-10-CM | POA: Diagnosis not present

## 2023-09-09 DIAGNOSIS — M797 Fibromyalgia: Secondary | ICD-10-CM | POA: Diagnosis not present

## 2023-09-13 DIAGNOSIS — Z794 Long term (current) use of insulin: Secondary | ICD-10-CM | POA: Diagnosis not present

## 2023-09-13 DIAGNOSIS — M4726 Other spondylosis with radiculopathy, lumbar region: Secondary | ICD-10-CM | POA: Diagnosis not present

## 2023-09-13 DIAGNOSIS — F319 Bipolar disorder, unspecified: Secondary | ICD-10-CM | POA: Diagnosis not present

## 2023-09-13 DIAGNOSIS — M7918 Myalgia, other site: Secondary | ICD-10-CM | POA: Diagnosis not present

## 2023-09-13 DIAGNOSIS — E119 Type 2 diabetes mellitus without complications: Secondary | ICD-10-CM | POA: Diagnosis not present

## 2023-09-13 DIAGNOSIS — M47892 Other spondylosis, cervical region: Secondary | ICD-10-CM | POA: Diagnosis not present

## 2023-09-15 DIAGNOSIS — M7918 Myalgia, other site: Secondary | ICD-10-CM | POA: Diagnosis not present

## 2023-09-15 DIAGNOSIS — M4726 Other spondylosis with radiculopathy, lumbar region: Secondary | ICD-10-CM | POA: Diagnosis not present

## 2023-09-15 DIAGNOSIS — Z794 Long term (current) use of insulin: Secondary | ICD-10-CM | POA: Diagnosis not present

## 2023-09-15 DIAGNOSIS — M47892 Other spondylosis, cervical region: Secondary | ICD-10-CM | POA: Diagnosis not present

## 2023-09-15 DIAGNOSIS — E119 Type 2 diabetes mellitus without complications: Secondary | ICD-10-CM | POA: Diagnosis not present

## 2023-09-15 DIAGNOSIS — F319 Bipolar disorder, unspecified: Secondary | ICD-10-CM | POA: Diagnosis not present

## 2023-10-07 DIAGNOSIS — Z79891 Long term (current) use of opiate analgesic: Secondary | ICD-10-CM | POA: Diagnosis not present

## 2023-10-07 DIAGNOSIS — M797 Fibromyalgia: Secondary | ICD-10-CM | POA: Diagnosis not present

## 2023-10-07 DIAGNOSIS — G894 Chronic pain syndrome: Secondary | ICD-10-CM | POA: Diagnosis not present

## 2023-10-07 DIAGNOSIS — Z5181 Encounter for therapeutic drug level monitoring: Secondary | ICD-10-CM | POA: Diagnosis not present

## 2023-10-07 DIAGNOSIS — F319 Bipolar disorder, unspecified: Secondary | ICD-10-CM | POA: Diagnosis not present

## 2023-10-07 DIAGNOSIS — F4542 Pain disorder with related psychological factors: Secondary | ICD-10-CM | POA: Diagnosis not present

## 2023-10-07 DIAGNOSIS — M5459 Other low back pain: Secondary | ICD-10-CM | POA: Diagnosis not present

## 2023-10-16 ENCOUNTER — Other Ambulatory Visit: Payer: Self-pay | Admitting: Physician Assistant

## 2023-10-16 DIAGNOSIS — G2581 Restless legs syndrome: Secondary | ICD-10-CM

## 2023-10-18 ENCOUNTER — Other Ambulatory Visit: Payer: Self-pay

## 2023-10-18 ENCOUNTER — Emergency Department (HOSPITAL_BASED_OUTPATIENT_CLINIC_OR_DEPARTMENT_OTHER)
Admission: EM | Admit: 2023-10-18 | Discharge: 2023-10-18 | Disposition: A | Discharge status: Home / Self Care | Attending: Emergency Medicine | Admitting: Emergency Medicine

## 2023-10-18 DIAGNOSIS — M545 Low back pain, unspecified: Secondary | ICD-10-CM | POA: Insufficient documentation

## 2023-10-18 DIAGNOSIS — Z794 Long term (current) use of insulin: Secondary | ICD-10-CM | POA: Diagnosis not present

## 2023-10-18 DIAGNOSIS — G8929 Other chronic pain: Secondary | ICD-10-CM | POA: Diagnosis not present

## 2023-10-18 MED ORDER — DIAZEPAM 2 MG PO TABS
2.0000 mg | ORAL_TABLET | Freq: Once | ORAL | Status: AC
  Administered 2023-10-18 (×2): 2 mg via ORAL
  Filled 2023-10-18: qty 1

## 2023-10-18 MED ORDER — FENTANYL CITRATE PF 50 MCG/ML IJ SOSY
75.0000 ug | PREFILLED_SYRINGE | Freq: Once | INTRAMUSCULAR | Status: AC
  Administered 2023-10-18 (×2): 75 ug via INTRAMUSCULAR
  Filled 2023-10-18: qty 2

## 2023-10-18 NOTE — ED Triage Notes (Signed)
 Pt caox4 c/o L lower back pain radiating down L leg, pt reports she sees pain clinic for chronic back pain but over the past week it has become exacerbated by doing more activity than usual. Last took pain meds last night.

## 2023-10-18 NOTE — ED Notes (Signed)
 Pt alert and oriented X 4 at the time of discharge. RR even and unlabored. No acute distress noted. Pt verbalized understanding of discharge instructions as discussed. Pt transported in wheelchair to lobby at time of discharge.

## 2023-10-18 NOTE — ED Provider Notes (Signed)
 [name] EMERGENCY DEPARTMENT AT [facility] Provider Note   CSN: [id number] Arrival date & time: 10/18/23  9043     Patient presents with: Back Pain   [name] [name] is a 78 y.o. female.   78 year old female presents with worsening chronic low back pain.  States that she feels as if she overdid the last couple days.  Does go to pain management.  Pain is in her left flank and radiates down her left leg.  Denies any urinary symptoms.  No bowel or bladder dysfunction.  No fever or chills.  Has been using her home medications without relief.       Prior to Admission medications   Medication Sig Start Date End Date Taking? Authorizing Provider  acetaminophen  (TYLENOL ) 500 MG tablet Take 1 tablet (500 mg total) by mouth every 6 (six) hours as needed. 11/15/22   Charlyn Sora, MD  albuterol  (VENTOLIN  HFA) 108 (90 Base) MCG/ACT inhaler Inhale 1-2 puffs into the lungs every 6 (six) hours as needed for wheezing or shortness of breath.    [provider]  atorvastatin  (LIPITOR) 40 MG tablet Take 1 tablet (40 mg total) by mouth See admin instructions. Take 40 mg by mouth at 5 PM 06/12/22   Sebastian Toribio GAILS, MD  b complex vitamins capsule Take 1 capsule by mouth daily.    [provider]  bismuth subsalicylate (PEPTO BISMOL) 262 MG/15ML suspension Take 30 mLs by mouth every 6 (six) hours as needed.    [provider]  calcium  carbonate (TUMS EX) 750 MG chewable tablet Chew 1 tablet by mouth as needed.    [provider]  cholecalciferol  (VITAMIN D3) 25 MCG (1000 UNIT) tablet Take 1,000-2,000 Units by mouth See admin instructions. Take 1,000 units by mouth in the morning and 2,000 units at 5 PM    [provider]  Continuous Glucose Receiver (FREESTYLE LIBRE 3 READER) DEVI 1 each by Does not apply route continuous. To use with freestyle libre 3+ sensor. Patient not taking: Reported on 08/30/2023 05/14/23   Thapa, [place], MD  Continuous Glucose  Sensor (FREESTYLE LIBRE 3 PLUS SENSOR) MISC 1 each by Does not apply route continuous. Change every 15 days. 05/14/23   Thapa, [place], MD  Continuous Glucose Sensor (FREESTYLE LIBRE 3 PLUS SENSOR) MISC Change every 15 days 08/30/23   Thapa, [place], MD  CONTOUR NEXT TEST test strip USE TO TEST BLOOD SUGAR TWICE DAILY 08/01/21   Von Pacific, MD  cyclobenzaprine  (FLEXERIL ) 5 MG tablet Take 5-10 mg by mouth every 8 (eight) hours as needed. 10/30/22   [provider]  diclofenac  (VOLTAREN ) 75 MG EC tablet Take 75 mg by mouth as needed. 05/11/22   [provider]  dicyclomine  (BENTYL ) 20 MG tablet Take 1 tablet (20 mg total) by mouth 3 (three) times daily as needed for spasms. 06/17/21   Jillian Buttery, MD  divalproex  (DEPAKOTE ) 250 MG DR tablet TAKE 2 TABLETS(500 MG) BY MOUTH AT BEDTIME 08/06/23   Hurst, Verneita DASEN, PA-C  ezetimibe  (ZETIA ) 10 MG tablet Take 1 tablet (10 mg total) by mouth every evening. 06/12/22   Sebastian Toribio GAILS, MD  fluticasone  (FLONASE ) 50 MCG/ACT nasal spray Place 1 spray into both nostrils 2 (two) times daily as needed for allergies or rhinitis.    [provider]  FUROSEMIDE  PO Take by mouth as needed.    [provider]  gabapentin  (NEURONTIN ) 600 MG tablet Take 600 mg by mouth 3 (three) times daily. Take 600  mg by mouth in the morning, at 5 PM, and at bedtime 05/12/19   [provider]  ibuprofen  (ADVIL ) 600 MG tablet Take 1 tablet (600 mg total) by mouth 4 (four) times daily. 11/15/22   Charlyn Sora, MD  insulin  glargine (LANTUS  SOLOSTAR) 100 UNIT/ML Solostar Pen Inject 56 Units into the skin daily. 06/14/23   Thapa, [place], MD  insulin  lispro (HUMALOG  KWIKPEN) 100 UNIT/ML KwikPen Inject 14-18 Units into the skin 2 (two) times daily before a meal. Patient taking differently: Inject 14-18 Units into the skin 2 (two) times daily before a meal. Patient takes as needed 07/06/22   Von Pacific, MD  Insulin  Pen Needle (BD PEN NEEDLE NANO 2ND GEN) 32G X 4 MM  MISC Use to Inject 5 times daily as directed 02/03/23   Thapa, [place], MD  Ipratropium-Albuterol  (COMBIVENT  RESPIMAT) 20-100 MCG/ACT AERS respimat Inhale 1 puff into the lungs in the morning, at noon, and at bedtime for 7 days, THEN 1 puff every 6 (six) hours as needed for wheezing. 06/05/22 08/30/23  Sebastian Toribio GAILS, MD  levothyroxine  (SYNTHROID ) 75 MCG tablet Take 1 tablet (75 mcg total) by mouth daily before breakfast. 12/28/22   Thapa, [place], MD  lidocaine  (XYLOCAINE ) 2 % solution Use as directed 15 mLs in the mouth or throat as directed. 06/01/22   [provider]  LINZESS 145 MCG CAPS capsule TAKE 1 CAPSULE BY MOUTH DAILY 30 MINUTES BEFORE FIRST MEAL OF THE DAY ON AN EMPTY STOMACH for 30 03/26/23   [provider]  liothyronine  (CYTOMEL ) 5 MCG tablet TAKE 1 TABLET BY MOUTH DAILY WITH LEVOTHYROXINE   TAKE BEFORE BREAKFAST 12/28/22   Thapa, [place], MD  loperamide  (IMODIUM ) 2 MG capsule Take 2 capsules (4 mg total) by mouth 3 (three) times daily as needed for diarrhea or loose stools. 11/12/21   Danford, Lonni SQUIBB, MD  loratadine  (CLARITIN ) 10 MG tablet Take 10 mg by mouth daily as needed for allergies.    [provider]  losartan  (COZAAR ) 50 MG tablet Take 50 mg by mouth See admin instructions. 08/31/19   [provider]  methocarbamol  (ROBAXIN ) 750 MG tablet Take 1 tablet (750 mg total) by mouth 3 (three) times daily as needed (muscle spasm/pain). 10/22/22   Bernard Drivers, MD  Microlet Lancets MISC TEST DAILY AS DIRECTED 08/01/21   Von Pacific, MD  nitroGLYCERIN  (NITROSTAT ) 0.4 MG SL tablet Place 1 tablet (0.4 mg total) under the tongue every 5 (five) minutes as needed for chest pain. 09/14/17   Marcine Catalan M, PA-C  ondansetron  (ZOFRAN ) 4 MG tablet Take 1 tablet (4 mg total) by mouth every 6 (six) hours. 09/20/21   Curatolo, Adam, DO  oxyCODONE -acetaminophen  (PERCOCET) 7.5-325 MG tablet Take 1 tablet by mouth every 8 (eight) hours as needed. 02/25/23   [provider]  pantoprazole  (PROTONIX ) 40 MG tablet Take 1 tablet (40 mg total) by mouth daily at 6 (six) AM. 06/06/22   Sebastian Toribio GAILS, MD  rOPINIRole  (REQUIP ) 0.5 MG tablet TAKE 4 TABLETS BY MOUTH AT 5PM, 3 TABLETS BY MOUTH AT BEDTIME, AND 1-2 TABLETS DAILY AS NEEDED 10/17/23   Rhys, Verneita T, PA-C  Wheat Dextrin (BENEFIBER PO) Take 4-5 g by mouth daily as needed (for constipation- mix as directed).    [provider]    Allergies: Codeine; Procaine hcl; Epinephrine ; Ozempic  (0.25 or 0.5 mg-dose) [semaglutide (0.25 or 0.5mg -dos)]; Aspirin ; Augmentin  [amoxicillin -pot clavulanate]; Benadryl  [diphenhydramine ]; Compazine  [prochlorperazine ]; Diflucan  [fluconazole ]; Dilaudid  [hydromorphone  hcl]; Hydromorphone ; Hydroxyzine ; Morphine ; Other; Prednisone ; Tramadol   hcl; Antihistamines, loratadine -type; Macrobid  [nitrofurantoin ]; and Sulfa antibiotics    Review of Systems  All other systems reviewed and are negative.   Updated Vital Signs BP 115/63   Pulse 96   Temp 98.1 F (36.7 C) (Oral)   Resp 16   Ht 1.524 m (5')   Wt 72.1 kg   SpO2 93%   BMI 31.05 kg/m   Physical Exam Vitals and nursing note reviewed.  Constitutional:      General: She is not in acute distress.    Appearance: Normal appearance. She is well-developed. She is not toxic-appearing.  HENT:     Head: Normocephalic and atraumatic.  Eyes:     General: Lids are normal.     Conjunctiva/sclera: Conjunctivae normal.     Pupils: Pupils are equal, round, and reactive to light.  Neck:     Thyroid : No thyroid  mass.     Trachea: No tracheal deviation.  Cardiovascular:     Rate and Rhythm: Normal rate and regular rhythm.     Heart sounds: Normal heart sounds. No murmur heard.    No gallop.  Pulmonary:     Effort: Pulmonary effort is normal. No respiratory distress.     Breath sounds: Normal breath sounds. No stridor. No decreased breath sounds, wheezing, rhonchi or rales.  Abdominal:     General: There is no  distension.     Palpations: Abdomen is soft.     Tenderness: There is no abdominal tenderness. There is no rebound.  Musculoskeletal:        General: No tenderness. Normal range of motion.     Cervical back: Normal range of motion and neck supple.       Back:  Skin:    General: Skin is warm and dry.     Findings: No abrasion or rash.  Neurological:     General: No focal deficit present.     Mental Status: She is alert and oriented to person, place, and time. Mental status is at baseline.     GCS: GCS eye subscore is 4. GCS verbal subscore is 5. GCS motor subscore is 6.     Cranial Nerves: No cranial nerve deficit.     Sensory: No sensory deficit.     Motor: Motor function is intact.     Comments: Strength is 5/5 bilateral lower extremities.  Normal dorsal and plantarflexion  Psychiatric:        Attention and Perception: Attention normal.        Speech: Speech normal.        Behavior: Behavior normal.     (all labs ordered are listed, but only abnormal results are displayed) Labs Reviewed - No data to display  EKG: None  Radiology: No results found.   Procedures   Medications Ordered in the ED  fentaNYL  (SUBLIMAZE ) injection 75 mcg (has no administration in time range)  diazepam  (VALIUM ) tablet 2 mg (has no administration in time range)                                    Medical Decision Making Risk Prescription drug management.   Patient medicated for pain here and feels better.  Do not feel that she needs to have any imaging.  Will follow-up with her pain management specialist     Final diagnoses:  None    ED Discharge Orders     None  Dasie Faden, MD 10/18/23 1235

## 2023-10-21 DIAGNOSIS — M5416 Radiculopathy, lumbar region: Secondary | ICD-10-CM | POA: Diagnosis not present

## 2023-10-27 DIAGNOSIS — M5442 Lumbago with sciatica, left side: Secondary | ICD-10-CM | POA: Diagnosis not present

## 2023-11-01 ENCOUNTER — Telehealth (INDEPENDENT_AMBULATORY_CARE_PROVIDER_SITE_OTHER): Payer: Self-pay

## 2023-11-01 ENCOUNTER — Encounter: Payer: Self-pay | Admitting: Endocrinology

## 2023-11-01 DIAGNOSIS — Z794 Long term (current) use of insulin: Secondary | ICD-10-CM

## 2023-11-01 DIAGNOSIS — E119 Type 2 diabetes mellitus without complications: Secondary | ICD-10-CM | POA: Diagnosis not present

## 2023-11-01 NOTE — Telephone Encounter (Signed)
   CONTINUOUS GLUCOSE MONITORING SYSTEM (CGMS) INTERPRETATION:           FreeStyle Libre CGM-  Sensor Download (Sensor download was reviewed and summarized below.) Dates: August 12-25, 2025, 14 days  Sensor Average: 143  Glucose Management Indicator: 6.7%   % data captured: 99%          Trends:  Patient has reasonable blood sugar control citrated morning.  She started to have hyperglycemia with blood sugar 260 - 300s from the evening of last Saturday, hyperglycemia postprandially.  Blood sugar in between the meals are acceptable.  Hyperglycemia likely related to taking steroid.  Recommendations: -I would recommend to take Humalog  5 units for the meal before eating plus sliding scale as suggested in the clinic visit in June. - Continue current dose of Lantus  56 units daily. - Take fixed dose of Humalog  5 units before eating plus sliding scale until she is on a steroid. - Call our clinic with any questions.   [place] Francille Wittmann, MD [facility] Endocrinology [facility] [address] [place], [address] [place], [place] [address] Phone # [phone]

## 2023-11-01 NOTE — Telephone Encounter (Signed)
 Patient called stating she started on steroids on Saturday and since her blood glucose been up and down per patient. Patient requesting MD look at CGM data and decide if she should adjust regimen.

## 2023-11-04 DIAGNOSIS — G894 Chronic pain syndrome: Secondary | ICD-10-CM | POA: Diagnosis not present

## 2023-11-04 DIAGNOSIS — Z79891 Long term (current) use of opiate analgesic: Secondary | ICD-10-CM | POA: Diagnosis not present

## 2023-11-04 DIAGNOSIS — M5459 Other low back pain: Secondary | ICD-10-CM | POA: Diagnosis not present

## 2023-11-04 DIAGNOSIS — Z5181 Encounter for therapeutic drug level monitoring: Secondary | ICD-10-CM | POA: Diagnosis not present

## 2023-11-04 DIAGNOSIS — M797 Fibromyalgia: Secondary | ICD-10-CM | POA: Diagnosis not present

## 2023-11-04 DIAGNOSIS — M62838 Other muscle spasm: Secondary | ICD-10-CM | POA: Diagnosis not present

## 2023-11-04 DIAGNOSIS — M51362 Other intervertebral disc degeneration, lumbar region with discogenic back pain and lower extremity pain: Secondary | ICD-10-CM | POA: Diagnosis not present

## 2023-11-04 DIAGNOSIS — G629 Polyneuropathy, unspecified: Secondary | ICD-10-CM | POA: Diagnosis not present

## 2023-11-04 DIAGNOSIS — F4542 Pain disorder with related psychological factors: Secondary | ICD-10-CM | POA: Diagnosis not present

## 2023-11-04 DIAGNOSIS — F319 Bipolar disorder, unspecified: Secondary | ICD-10-CM | POA: Diagnosis not present

## 2023-11-05 DIAGNOSIS — R350 Frequency of micturition: Secondary | ICD-10-CM | POA: Diagnosis not present

## 2023-11-05 DIAGNOSIS — Z03818 Encounter for observation for suspected exposure to other biological agents ruled out: Secondary | ICD-10-CM | POA: Diagnosis not present

## 2023-11-05 DIAGNOSIS — R051 Acute cough: Secondary | ICD-10-CM | POA: Diagnosis not present

## 2023-11-07 DIAGNOSIS — M4802 Spinal stenosis, cervical region: Secondary | ICD-10-CM | POA: Diagnosis not present

## 2023-11-07 DIAGNOSIS — M4726 Other spondylosis with radiculopathy, lumbar region: Secondary | ICD-10-CM | POA: Diagnosis not present

## 2023-11-07 DIAGNOSIS — M48062 Spinal stenosis, lumbar region with neurogenic claudication: Secondary | ICD-10-CM | POA: Diagnosis not present

## 2023-11-07 DIAGNOSIS — M4712 Other spondylosis with myelopathy, cervical region: Secondary | ICD-10-CM | POA: Diagnosis not present

## 2023-11-09 ENCOUNTER — Other Ambulatory Visit

## 2023-11-09 DIAGNOSIS — K5900 Constipation, unspecified: Secondary | ICD-10-CM | POA: Diagnosis not present

## 2023-11-09 DIAGNOSIS — R350 Frequency of micturition: Secondary | ICD-10-CM | POA: Diagnosis not present

## 2023-11-09 DIAGNOSIS — N8110 Cystocele, unspecified: Secondary | ICD-10-CM | POA: Diagnosis not present

## 2023-11-09 DIAGNOSIS — R232 Flushing: Secondary | ICD-10-CM | POA: Diagnosis not present

## 2023-11-09 DIAGNOSIS — E039 Hypothyroidism, unspecified: Secondary | ICD-10-CM | POA: Diagnosis not present

## 2023-11-09 DIAGNOSIS — N898 Other specified noninflammatory disorders of vagina: Secondary | ICD-10-CM | POA: Diagnosis not present

## 2023-11-09 LAB — LAB REPORT - SCANNED: TSH: 0.59 (ref 0.41–5.90)

## 2023-11-10 ENCOUNTER — Ambulatory Visit: Admitting: Physician Assistant

## 2023-11-10 ENCOUNTER — Encounter: Payer: Self-pay | Admitting: Physician Assistant

## 2023-11-10 DIAGNOSIS — G2581 Restless legs syndrome: Secondary | ICD-10-CM

## 2023-11-10 DIAGNOSIS — F411 Generalized anxiety disorder: Secondary | ICD-10-CM

## 2023-11-10 DIAGNOSIS — F319 Bipolar disorder, unspecified: Secondary | ICD-10-CM | POA: Diagnosis not present

## 2023-11-10 MED ORDER — ROPINIROLE HCL 0.5 MG PO TABS: ORAL_TABLET | ORAL | 3 refills | Status: AC

## 2023-11-10 MED ORDER — DIVALPROEX SODIUM 250 MG PO DR TAB: 500.0000 mg | DELAYED_RELEASE_TABLET | Freq: Every evening | ORAL | 3 refills | Status: DC

## 2023-11-10 NOTE — Progress Notes (Addendum)
 [facility] Check  Patient ID: [name] [name],  MRN: [id number]  PCP: Kip Righter, MD  Date of Evaluation: 11/10/2023 Time spent:25 minutes  Chief Complaint:  Chief Complaint   Depression; Follow-up   Virtual Visit via Telehealth  I connected with patient by telephone, with their informed consent, and verified patient privacy and that I am speaking with the correct person using two identifiers.  I am private, in my office and the patient is at home.  I discussed the limitations, risks, security and privacy concerns of performing an evaluation and management service by telephone and the availability of in person appointments. I also discussed with the patient that there may be a patient responsible charge related to this service. The patient expressed understanding and agreed to proceed.   I discussed the assessment and treatment plan with the patient. The patient was provided an opportunity to ask questions and all were answered. The patient agreed with the plan and demonstrated an understanding of the instructions.   The patient was advised to call back or seek an in-person evaluation if the symptoms worsen or if the condition fails to improve as anticipated.  I provided approximately 25  minutes of non-face-to-face time during this encounter.  HISTORY/CURRENT STATUS: HPI For routine med check.   Pain management Rx Cymbalta but she's hesitant to take. Doesn't want to make any changes. Feels like mental health is in a good place right now. Still in a lot of pain in her back though.  No extreme sadness, tearfulness, or feelings of hopelessness.  Sleeps ok. ADLs and personal hygiene are normal.   Denies any changes in concentration, making decisions, or remembering things.  Appetite has not changed.  Weight is stable.   No mania, delirium, AH/VH.  No SI/HI.  Individual Medical History/ Review of Systems: Changes? :No   Past Medication Trials: Tegretol -adverse  reaction Lamictal-itching, swollen lymph nodes Trileptal-ineffective Depakote - has taken long-term with some benefit Gabapentin -prescribed for RLS, pain, and anxiety.  Unable to tolerate doses above 400 mg 3 times daily Topamax Gabitril Lyrica Keppra Zonegran Sonata -intermittently effective Xanax -effective Ativan  Valium  Klonopin -patient reports feeling peculiar Lithium -has taken long-term with some improvement.  Unable to tolerate doses greater than 450 mg Seroquel - helpful for racing thoughts and mood signs and symptoms.  Daytime somnolence with higher doses.  Has caused RLS without Requip . Geodon-tolerability issues Rexulti-EPS Latuda-EPS Vraylar-EPS Saphris-excessive sedation and severe RLS Abilify -may have exacerbated gambling Risperdal Olanzapine - RLS, increased appetite, wt gain Perphenazine Loxapine - severe adverse effects at low dose. Had weakness, twitches BuSpar -ineffective Prozac -effective and then stopped working Zoloft-could not tolerate Lexapro -has used short-term to alleviate depressive signs and symptoms Remeron  Wellbutrin Vistaril - ineffective Requip - takes due to restless legs secondary to Seroquel .  Has taken long-term and reports being on higher doses in the past.  Denies correlation between Requip  and gambling. Pramipexole NAC Deplin Buprenorphine  Namenda  Verapamil  Pindolol Isradapine Clonidine  Lunesta-ineffective Belsomra- ineffective Dayvigo - Legs buckled up. Ambien  Ambien  CR-in effective Trazodone-nightmares Doxepin - ineffective Remeron   Allergies: Codeine; Procaine hcl; Epinephrine ; Ozempic  (0.25 or 0.5 mg-dose) [semaglutide (0.25 or 0.5mg -dos)]; Aspirin ; Augmentin  [amoxicillin -pot clavulanate]; Benadryl  [diphenhydramine ]; Compazine  [prochlorperazine ]; Diflucan  [fluconazole ]; Dilaudid  [hydromorphone  hcl]; Hydromorphone ; Hydroxyzine ; Morphine ; Other; Prednisone ; Tramadol  hcl; Antihistamines, loratadine -type; Macrobid  [nitrofurantoin ];  and Sulfa antibiotics  Current Medications:  Current Outpatient Medications:    acetaminophen  (TYLENOL ) 500 MG tablet, Take 1 tablet (500 mg total) by mouth every 6 (six) hours as needed., Disp: 30 tablet, Rfl: 0   albuterol  (VENTOLIN  HFA) 108 (90 Base) MCG/ACT inhaler, Inhale 1-2 puffs into the  lungs every 6 (six) hours as needed for wheezing or shortness of breath., Disp: , Rfl:    atorvastatin  (LIPITOR) 40 MG tablet, Take 1 tablet (40 mg total) by mouth See admin instructions. Take 40 mg by mouth at 5 PM, Disp: , Rfl: 0   b complex vitamins capsule, Take 1 capsule by mouth daily., Disp: , Rfl:    bismuth subsalicylate (PEPTO BISMOL) 262 MG/15ML suspension, Take 30 mLs by mouth every 6 (six) hours as needed., Disp: , Rfl:    calcium  carbonate (TUMS EX) 750 MG chewable tablet, Chew 1 tablet by mouth as needed., Disp: , Rfl:    cholecalciferol  (VITAMIN D3) 25 MCG (1000 UNIT) tablet, Take 1,000-2,000 Units by mouth See admin instructions. Take 1,000 units by mouth in the morning and 2,000 units at 5 PM, Disp: , Rfl:    Continuous Glucose Receiver (FREESTYLE LIBRE 3 READER) DEVI, 1 each by Does not apply route continuous. To use with freestyle libre 3+ sensor., Disp: 1 each, Rfl: 0   Continuous Glucose Sensor (FREESTYLE LIBRE 3 PLUS SENSOR) MISC, 1 each by Does not apply route continuous. Change every 15 days., Disp: 6 each, Rfl: 3   Continuous Glucose Sensor (FREESTYLE LIBRE 3 PLUS SENSOR) MISC, Change every 15 days, Disp: , Rfl:    CONTOUR NEXT TEST test strip, USE TO TEST BLOOD SUGAR TWICE DAILY, Disp: 100 strip, Rfl: 3   cyclobenzaprine  (FLEXERIL ) 5 MG tablet, Take 5-10 mg by mouth every 8 (eight) hours as needed., Disp: , Rfl:    diclofenac  (VOLTAREN ) 75 MG EC tablet, Take 75 mg by mouth as needed., Disp: , Rfl:    ezetimibe  (ZETIA ) 10 MG tablet, Take 1 tablet (10 mg total) by mouth every evening., Disp: , Rfl:    fluticasone  (FLONASE ) 50 MCG/ACT nasal spray, Place 1 spray into both nostrils 2  (two) times daily as needed for allergies or rhinitis., Disp: , Rfl:    FUROSEMIDE  PO, Take by mouth as needed., Disp: , Rfl:    gabapentin  (NEURONTIN ) 600 MG tablet, Take 600 mg by mouth 3 (three) times daily. Take 600 mg by mouth in the morning, at 5 PM, and at bedtime, Disp: , Rfl:    insulin  glargine (LANTUS  SOLOSTAR) 100 UNIT/ML Solostar Pen, Inject 56 Units into the skin daily., Disp: 30 mL, Rfl: 4   Insulin  Pen Needle (BD PEN NEEDLE NANO 2ND GEN) 32G X 4 MM MISC, Use to Inject 5 times daily as directed, Disp: 300 each, Rfl: 3   levothyroxine  (SYNTHROID ) 75 MCG tablet, Take 1 tablet (75 mcg total) by mouth daily before breakfast., Disp: 90 tablet, Rfl: 3   lidocaine  (XYLOCAINE ) 2 % solution, Use as directed 15 mLs in the mouth or throat as directed., Disp: , Rfl:    LINZESS 145 MCG CAPS capsule, TAKE 1 CAPSULE BY MOUTH DAILY 30 MINUTES BEFORE FIRST MEAL OF THE DAY ON AN EMPTY STOMACH for 30, Disp: , Rfl:    liothyronine  (CYTOMEL ) 5 MCG tablet, TAKE 1 TABLET BY MOUTH DAILY WITH LEVOTHYROXINE   TAKE BEFORE BREAKFAST, Disp: 90 tablet, Rfl: 3   loperamide  (IMODIUM ) 2 MG capsule, Take 2 capsules (4 mg total) by mouth 3 (three) times daily as needed for diarrhea or loose stools., Disp: 30 capsule, Rfl: 0   loratadine  (CLARITIN ) 10 MG tablet, Take 10 mg by mouth daily as needed for allergies., Disp: , Rfl:    losartan  (COZAAR ) 50 MG tablet, Take 50 mg by mouth See admin instructions., Disp: , Rfl:  nitroGLYCERIN  (NITROSTAT ) 0.4 MG SL tablet, Place 1 tablet (0.4 mg total) under the tongue every 5 (five) minutes as needed for chest pain., Disp: 25 tablet, Rfl: 2   ondansetron  (ZOFRAN ) 4 MG tablet, Take 1 tablet (4 mg total) by mouth every 6 (six) hours., Disp: 12 tablet, Rfl: 0   oxyCODONE -acetaminophen  (PERCOCET) 7.5-325 MG tablet, Take 1 tablet by mouth every 8 (eight) hours as needed., Disp: , Rfl:    pantoprazole  (PROTONIX ) 40 MG tablet, Take 1 tablet (40 mg total) by mouth daily at 6 (six) AM.,  Disp: 30 tablet, Rfl: 0   Wheat Dextrin (BENEFIBER PO), Take 4-5 g by mouth daily as needed (for constipation- mix as directed)., Disp: , Rfl:    dicyclomine  (BENTYL ) 20 MG tablet, Take 1 tablet (20 mg total) by mouth 3 (three) times daily as needed for spasms., Disp: 20 tablet, Rfl: 0   divalproex  (DEPAKOTE ) 250 MG DR tablet, Take 2 tablets (500 mg total) by mouth at bedtime., Disp: 180 tablet, Rfl: 3   ibuprofen  (ADVIL ) 600 MG tablet, Take 1 tablet (600 mg total) by mouth 4 (four) times daily., Disp: 20 tablet, Rfl: 0   insulin  lispro (HUMALOG  KWIKPEN) 100 UNIT/ML KwikPen, Inject 14-18 Units into the skin 2 (two) times daily before a meal. (Patient taking differently: Inject 14-18 Units into the skin 2 (two) times daily before a meal. Patient takes as needed), Disp: 15 mL, Rfl: 1   Ipratropium-Albuterol  (COMBIVENT  RESPIMAT) 20-100 MCG/ACT AERS respimat, Inhale 1 puff into the lungs in the morning, at noon, and at bedtime for 7 days, THEN 1 puff every 6 (six) hours as needed for wheezing., Disp: 1 each, Rfl: 0   methocarbamol  (ROBAXIN ) 750 MG tablet, Take 1 tablet (750 mg total) by mouth 3 (three) times daily as needed (muscle spasm/pain)., Disp: 15 tablet, Rfl: 0   Microlet Lancets MISC, TEST DAILY AS DIRECTED, Disp: 100 each, Rfl: 3   rOPINIRole  (REQUIP ) 0.5 MG tablet, TAKE 4 TABLETS BY MOUTH AT 5PM, 3 TABLETS BY MOUTH AT BEDTIME, AND 1-2 TABLETS DAILY AS NEEDED, Disp: 720 tablet, Rfl: 3 Medication Side Effects: none  Family Medical/ Social History: Changes? No  MENTAL HEALTH EXAM:  There were no vitals taken for this visit.There is no height or weight on file to calculate BMI.  General Appearance: Casual and Well Groomed  Eye Contact:  Good  Speech:  Clear and Coherent and Normal Rate  Volume:  Normal  Mood:  Euthymic  Affect:  Congruent  Thought Process:  Goal Directed and Descriptions of Associations: Circumstantial  Orientation:  Full (Time, Place, and Person)  Thought Content:  Logical   Suicidal Thoughts:  No  Homicidal Thoughts:  No  Memory:  WNL  Judgement:  Good  Insight:  Good  Psychomotor Activity:  walks slowly  Concentration:  Concentration: Good  Recall:  Good  Fund of Knowledge: Good  Language: Good  Assets:  Communication Skills Desire for Improvement Financial Resources/Insurance Housing Resilience Social Support Transportation  ADL's:  Intact  Cognition: WNL  Prognosis:  Good   DIAGNOSES:    ICD-10-CM   1. Bipolar 1 disorder ([name])  F31.9 divalproex  (DEPAKOTE ) 250 MG DR tablet    2. Restless leg syndrome  G25.81 rOPINIRole  (REQUIP ) 0.5 MG tablet    3. Generalized anxiety disorder  F41.1      Receiving Psychotherapy: Yes  Deb Young  RECOMMENDATIONS:   PDMP reviewed.  Oxycodone  filled 58/28/2025.  Gabapentin  filled 09/14/2023.  2 Valium  were given 04/22/2023.  Xanax  was last filled 12/31/2022. I provided approximately 20 minutes of non-face to face time during this encounter, including time spent before and after the visit in records review, medical decision making, counseling pertinent to today's visit, and charting.   I agree that Cymbalta could be really helpful. If pain management would like for me to prescribe Cymbalta I am happy to do that. Chanel prefers not to make changes.   Continue Depakote  250 mg, 2 at bedtime. Continue gabapentin  600 mg, 1 po tid. Continue ropinerole 0.5 mg,4 po at 5 PM, 3 po at bedtime, and 1-2 during day prn.  Continue counseling with Haroldine Salt.  Return in 2-3 months.  Verneita Cooks, PA-C

## 2023-11-11 DIAGNOSIS — E119 Type 2 diabetes mellitus without complications: Secondary | ICD-10-CM | POA: Diagnosis not present

## 2023-11-11 DIAGNOSIS — Z794 Long term (current) use of insulin: Secondary | ICD-10-CM | POA: Diagnosis not present

## 2023-11-11 DIAGNOSIS — M419 Scoliosis, unspecified: Secondary | ICD-10-CM | POA: Diagnosis not present

## 2023-11-11 DIAGNOSIS — F319 Bipolar disorder, unspecified: Secondary | ICD-10-CM | POA: Diagnosis not present

## 2023-11-11 DIAGNOSIS — Z7984 Long term (current) use of oral hypoglycemic drugs: Secondary | ICD-10-CM | POA: Diagnosis not present

## 2023-11-11 DIAGNOSIS — Z87440 Personal history of urinary (tract) infections: Secondary | ICD-10-CM | POA: Diagnosis not present

## 2023-11-11 DIAGNOSIS — Z9089 Acquired absence of other organs: Secondary | ICD-10-CM | POA: Diagnosis not present

## 2023-11-11 DIAGNOSIS — M47812 Spondylosis without myelopathy or radiculopathy, cervical region: Secondary | ICD-10-CM | POA: Diagnosis not present

## 2023-11-11 DIAGNOSIS — K589 Irritable bowel syndrome without diarrhea: Secondary | ICD-10-CM | POA: Diagnosis not present

## 2023-11-11 DIAGNOSIS — Z79891 Long term (current) use of opiate analgesic: Secondary | ICD-10-CM | POA: Diagnosis not present

## 2023-11-11 DIAGNOSIS — M797 Fibromyalgia: Secondary | ICD-10-CM | POA: Diagnosis not present

## 2023-11-11 DIAGNOSIS — Z604 Social exclusion and rejection: Secondary | ICD-10-CM | POA: Diagnosis not present

## 2023-11-11 DIAGNOSIS — I10 Essential (primary) hypertension: Secondary | ICD-10-CM | POA: Diagnosis not present

## 2023-11-11 DIAGNOSIS — Z96641 Presence of right artificial hip joint: Secondary | ICD-10-CM | POA: Diagnosis not present

## 2023-11-11 DIAGNOSIS — M48062 Spinal stenosis, lumbar region with neurogenic claudication: Secondary | ICD-10-CM | POA: Diagnosis not present

## 2023-11-11 DIAGNOSIS — Z90710 Acquired absence of both cervix and uterus: Secondary | ICD-10-CM | POA: Diagnosis not present

## 2023-11-11 DIAGNOSIS — L932 Other local lupus erythematosus: Secondary | ICD-10-CM | POA: Diagnosis not present

## 2023-11-11 DIAGNOSIS — Z79899 Other long term (current) drug therapy: Secondary | ICD-10-CM | POA: Diagnosis not present

## 2023-11-11 DIAGNOSIS — M4726 Other spondylosis with radiculopathy, lumbar region: Secondary | ICD-10-CM | POA: Diagnosis not present

## 2023-11-12 ENCOUNTER — Telehealth: Payer: Self-pay | Admitting: Endocrinology

## 2023-11-12 NOTE — Telephone Encounter (Signed)
 She had normal TSH of 0.59 checked on November 19, 2023 at outside facility, records received and reviewed.

## 2023-11-15 DIAGNOSIS — F319 Bipolar disorder, unspecified: Secondary | ICD-10-CM | POA: Diagnosis not present

## 2023-11-15 DIAGNOSIS — M48062 Spinal stenosis, lumbar region with neurogenic claudication: Secondary | ICD-10-CM | POA: Diagnosis not present

## 2023-11-15 DIAGNOSIS — M797 Fibromyalgia: Secondary | ICD-10-CM | POA: Diagnosis not present

## 2023-11-15 DIAGNOSIS — E119 Type 2 diabetes mellitus without complications: Secondary | ICD-10-CM | POA: Diagnosis not present

## 2023-11-15 DIAGNOSIS — M4726 Other spondylosis with radiculopathy, lumbar region: Secondary | ICD-10-CM | POA: Diagnosis not present

## 2023-11-15 DIAGNOSIS — M47812 Spondylosis without myelopathy or radiculopathy, cervical region: Secondary | ICD-10-CM | POA: Diagnosis not present

## 2023-11-17 ENCOUNTER — Ambulatory Visit: Payer: Self-pay | Admitting: Endocrinology

## 2023-11-17 DIAGNOSIS — G894 Chronic pain syndrome: Secondary | ICD-10-CM | POA: Diagnosis not present

## 2023-11-17 DIAGNOSIS — M545 Low back pain, unspecified: Secondary | ICD-10-CM | POA: Diagnosis not present

## 2023-11-17 DIAGNOSIS — M5459 Other low back pain: Secondary | ICD-10-CM | POA: Diagnosis not present

## 2023-11-17 DIAGNOSIS — Z5181 Encounter for therapeutic drug level monitoring: Secondary | ICD-10-CM | POA: Diagnosis not present

## 2023-11-17 DIAGNOSIS — M542 Cervicalgia: Secondary | ICD-10-CM | POA: Diagnosis not present

## 2023-11-17 DIAGNOSIS — Z79891 Long term (current) use of opiate analgesic: Secondary | ICD-10-CM | POA: Diagnosis not present

## 2023-11-18 DIAGNOSIS — M797 Fibromyalgia: Secondary | ICD-10-CM | POA: Diagnosis not present

## 2023-11-18 DIAGNOSIS — M47812 Spondylosis without myelopathy or radiculopathy, cervical region: Secondary | ICD-10-CM | POA: Diagnosis not present

## 2023-11-18 DIAGNOSIS — M48062 Spinal stenosis, lumbar region with neurogenic claudication: Secondary | ICD-10-CM | POA: Diagnosis not present

## 2023-11-18 DIAGNOSIS — E119 Type 2 diabetes mellitus without complications: Secondary | ICD-10-CM | POA: Diagnosis not present

## 2023-11-18 DIAGNOSIS — M4726 Other spondylosis with radiculopathy, lumbar region: Secondary | ICD-10-CM | POA: Diagnosis not present

## 2023-11-18 DIAGNOSIS — F319 Bipolar disorder, unspecified: Secondary | ICD-10-CM | POA: Diagnosis not present

## 2023-11-18 DIAGNOSIS — M7062 Trochanteric bursitis, left hip: Secondary | ICD-10-CM | POA: Diagnosis not present

## 2023-11-19 DIAGNOSIS — I10 Essential (primary) hypertension: Secondary | ICD-10-CM | POA: Diagnosis not present

## 2023-11-19 DIAGNOSIS — E119 Type 2 diabetes mellitus without complications: Secondary | ICD-10-CM | POA: Diagnosis not present

## 2023-11-19 DIAGNOSIS — M2550 Pain in unspecified joint: Secondary | ICD-10-CM | POA: Diagnosis not present

## 2023-11-19 DIAGNOSIS — M549 Dorsalgia, unspecified: Secondary | ICD-10-CM | POA: Diagnosis not present

## 2023-11-20 DIAGNOSIS — M797 Fibromyalgia: Secondary | ICD-10-CM | POA: Diagnosis not present

## 2023-11-20 DIAGNOSIS — E119 Type 2 diabetes mellitus without complications: Secondary | ICD-10-CM | POA: Diagnosis not present

## 2023-11-20 DIAGNOSIS — M47812 Spondylosis without myelopathy or radiculopathy, cervical region: Secondary | ICD-10-CM | POA: Diagnosis not present

## 2023-11-20 DIAGNOSIS — M4726 Other spondylosis with radiculopathy, lumbar region: Secondary | ICD-10-CM | POA: Diagnosis not present

## 2023-11-20 DIAGNOSIS — M48062 Spinal stenosis, lumbar region with neurogenic claudication: Secondary | ICD-10-CM | POA: Diagnosis not present

## 2023-11-20 DIAGNOSIS — F319 Bipolar disorder, unspecified: Secondary | ICD-10-CM | POA: Diagnosis not present

## 2023-11-22 DIAGNOSIS — F319 Bipolar disorder, unspecified: Secondary | ICD-10-CM | POA: Diagnosis not present

## 2023-11-22 DIAGNOSIS — M48062 Spinal stenosis, lumbar region with neurogenic claudication: Secondary | ICD-10-CM | POA: Diagnosis not present

## 2023-11-22 DIAGNOSIS — M47812 Spondylosis without myelopathy or radiculopathy, cervical region: Secondary | ICD-10-CM | POA: Diagnosis not present

## 2023-11-22 DIAGNOSIS — M797 Fibromyalgia: Secondary | ICD-10-CM | POA: Diagnosis not present

## 2023-11-22 DIAGNOSIS — M4726 Other spondylosis with radiculopathy, lumbar region: Secondary | ICD-10-CM | POA: Diagnosis not present

## 2023-11-22 DIAGNOSIS — E119 Type 2 diabetes mellitus without complications: Secondary | ICD-10-CM | POA: Diagnosis not present

## 2023-11-22 NOTE — Addendum Note (Signed)
 Addended by: Ariah Mower T on: 11/22/2023 03:24 PM   Modules accepted: Level of Service

## 2023-11-25 DIAGNOSIS — M4726 Other spondylosis with radiculopathy, lumbar region: Secondary | ICD-10-CM | POA: Diagnosis not present

## 2023-11-25 DIAGNOSIS — M797 Fibromyalgia: Secondary | ICD-10-CM | POA: Diagnosis not present

## 2023-11-25 DIAGNOSIS — F319 Bipolar disorder, unspecified: Secondary | ICD-10-CM | POA: Diagnosis not present

## 2023-11-25 DIAGNOSIS — M4314 Spondylolisthesis, thoracic region: Secondary | ICD-10-CM | POA: Diagnosis not present

## 2023-11-25 DIAGNOSIS — M4804 Spinal stenosis, thoracic region: Secondary | ICD-10-CM | POA: Diagnosis not present

## 2023-11-25 DIAGNOSIS — E119 Type 2 diabetes mellitus without complications: Secondary | ICD-10-CM | POA: Diagnosis not present

## 2023-11-25 DIAGNOSIS — M47814 Spondylosis without myelopathy or radiculopathy, thoracic region: Secondary | ICD-10-CM | POA: Diagnosis not present

## 2023-11-25 DIAGNOSIS — M47812 Spondylosis without myelopathy or radiculopathy, cervical region: Secondary | ICD-10-CM | POA: Diagnosis not present

## 2023-11-25 DIAGNOSIS — M48062 Spinal stenosis, lumbar region with neurogenic claudication: Secondary | ICD-10-CM | POA: Diagnosis not present

## 2023-11-25 DIAGNOSIS — M5134 Other intervertebral disc degeneration, thoracic region: Secondary | ICD-10-CM | POA: Diagnosis not present

## 2023-11-26 DIAGNOSIS — N3000 Acute cystitis without hematuria: Secondary | ICD-10-CM | POA: Diagnosis not present

## 2023-11-26 DIAGNOSIS — Z8739 Personal history of other diseases of the musculoskeletal system and connective tissue: Secondary | ICD-10-CM | POA: Diagnosis not present

## 2023-11-26 DIAGNOSIS — M47812 Spondylosis without myelopathy or radiculopathy, cervical region: Secondary | ICD-10-CM | POA: Diagnosis not present

## 2023-11-26 DIAGNOSIS — R399 Unspecified symptoms and signs involving the genitourinary system: Secondary | ICD-10-CM | POA: Diagnosis not present

## 2023-11-26 DIAGNOSIS — M4726 Other spondylosis with radiculopathy, lumbar region: Secondary | ICD-10-CM | POA: Diagnosis not present

## 2023-11-26 DIAGNOSIS — F319 Bipolar disorder, unspecified: Secondary | ICD-10-CM | POA: Diagnosis not present

## 2023-11-26 DIAGNOSIS — M48062 Spinal stenosis, lumbar region with neurogenic claudication: Secondary | ICD-10-CM | POA: Diagnosis not present

## 2023-11-26 DIAGNOSIS — M797 Fibromyalgia: Secondary | ICD-10-CM | POA: Diagnosis not present

## 2023-11-26 DIAGNOSIS — E119 Type 2 diabetes mellitus without complications: Secondary | ICD-10-CM | POA: Diagnosis not present

## 2023-11-30 DIAGNOSIS — M47812 Spondylosis without myelopathy or radiculopathy, cervical region: Secondary | ICD-10-CM | POA: Diagnosis not present

## 2023-11-30 DIAGNOSIS — M48062 Spinal stenosis, lumbar region with neurogenic claudication: Secondary | ICD-10-CM | POA: Diagnosis not present

## 2023-11-30 DIAGNOSIS — F319 Bipolar disorder, unspecified: Secondary | ICD-10-CM | POA: Diagnosis not present

## 2023-11-30 DIAGNOSIS — M4726 Other spondylosis with radiculopathy, lumbar region: Secondary | ICD-10-CM | POA: Diagnosis not present

## 2023-11-30 DIAGNOSIS — E119 Type 2 diabetes mellitus without complications: Secondary | ICD-10-CM | POA: Diagnosis not present

## 2023-11-30 DIAGNOSIS — M797 Fibromyalgia: Secondary | ICD-10-CM | POA: Diagnosis not present

## 2023-12-02 DIAGNOSIS — M47812 Spondylosis without myelopathy or radiculopathy, cervical region: Secondary | ICD-10-CM | POA: Diagnosis not present

## 2023-12-02 DIAGNOSIS — G629 Polyneuropathy, unspecified: Secondary | ICD-10-CM | POA: Diagnosis not present

## 2023-12-02 DIAGNOSIS — Z5181 Encounter for therapeutic drug level monitoring: Secondary | ICD-10-CM | POA: Diagnosis not present

## 2023-12-02 DIAGNOSIS — M5459 Other low back pain: Secondary | ICD-10-CM | POA: Diagnosis not present

## 2023-12-02 DIAGNOSIS — M48062 Spinal stenosis, lumbar region with neurogenic claudication: Secondary | ICD-10-CM | POA: Diagnosis not present

## 2023-12-02 DIAGNOSIS — M4726 Other spondylosis with radiculopathy, lumbar region: Secondary | ICD-10-CM | POA: Diagnosis not present

## 2023-12-02 DIAGNOSIS — F319 Bipolar disorder, unspecified: Secondary | ICD-10-CM | POA: Diagnosis not present

## 2023-12-02 DIAGNOSIS — M797 Fibromyalgia: Secondary | ICD-10-CM | POA: Diagnosis not present

## 2023-12-02 DIAGNOSIS — M51362 Other intervertebral disc degeneration, lumbar region with discogenic back pain and lower extremity pain: Secondary | ICD-10-CM | POA: Diagnosis not present

## 2023-12-02 DIAGNOSIS — G894 Chronic pain syndrome: Secondary | ICD-10-CM | POA: Diagnosis not present

## 2023-12-02 DIAGNOSIS — Z79891 Long term (current) use of opiate analgesic: Secondary | ICD-10-CM | POA: Diagnosis not present

## 2023-12-02 DIAGNOSIS — E119 Type 2 diabetes mellitus without complications: Secondary | ICD-10-CM | POA: Diagnosis not present

## 2023-12-03 DIAGNOSIS — M549 Dorsalgia, unspecified: Secondary | ICD-10-CM | POA: Diagnosis not present

## 2023-12-03 DIAGNOSIS — R5381 Other malaise: Secondary | ICD-10-CM | POA: Diagnosis not present

## 2023-12-03 DIAGNOSIS — N390 Urinary tract infection, site not specified: Secondary | ICD-10-CM | POA: Diagnosis not present

## 2023-12-03 DIAGNOSIS — R110 Nausea: Secondary | ICD-10-CM | POA: Diagnosis not present

## 2023-12-06 DIAGNOSIS — M545 Low back pain, unspecified: Secondary | ICD-10-CM | POA: Diagnosis not present

## 2023-12-07 DIAGNOSIS — E119 Type 2 diabetes mellitus without complications: Secondary | ICD-10-CM | POA: Diagnosis not present

## 2023-12-07 DIAGNOSIS — M4726 Other spondylosis with radiculopathy, lumbar region: Secondary | ICD-10-CM | POA: Diagnosis not present

## 2023-12-07 DIAGNOSIS — M47812 Spondylosis without myelopathy or radiculopathy, cervical region: Secondary | ICD-10-CM | POA: Diagnosis not present

## 2023-12-07 DIAGNOSIS — M48062 Spinal stenosis, lumbar region with neurogenic claudication: Secondary | ICD-10-CM | POA: Diagnosis not present

## 2023-12-07 DIAGNOSIS — M797 Fibromyalgia: Secondary | ICD-10-CM | POA: Diagnosis not present

## 2023-12-07 DIAGNOSIS — F319 Bipolar disorder, unspecified: Secondary | ICD-10-CM | POA: Diagnosis not present

## 2023-12-09 DIAGNOSIS — F319 Bipolar disorder, unspecified: Secondary | ICD-10-CM | POA: Diagnosis not present

## 2023-12-09 DIAGNOSIS — E119 Type 2 diabetes mellitus without complications: Secondary | ICD-10-CM | POA: Diagnosis not present

## 2023-12-09 DIAGNOSIS — M48062 Spinal stenosis, lumbar region with neurogenic claudication: Secondary | ICD-10-CM | POA: Diagnosis not present

## 2023-12-09 DIAGNOSIS — M4726 Other spondylosis with radiculopathy, lumbar region: Secondary | ICD-10-CM | POA: Diagnosis not present

## 2023-12-09 DIAGNOSIS — M47812 Spondylosis without myelopathy or radiculopathy, cervical region: Secondary | ICD-10-CM | POA: Diagnosis not present

## 2023-12-09 DIAGNOSIS — M797 Fibromyalgia: Secondary | ICD-10-CM | POA: Diagnosis not present

## 2023-12-10 DIAGNOSIS — M797 Fibromyalgia: Secondary | ICD-10-CM | POA: Diagnosis not present

## 2023-12-10 DIAGNOSIS — T3695XA Adverse effect of unspecified systemic antibiotic, initial encounter: Secondary | ICD-10-CM | POA: Diagnosis not present

## 2023-12-10 DIAGNOSIS — R399 Unspecified symptoms and signs involving the genitourinary system: Secondary | ICD-10-CM | POA: Diagnosis not present

## 2023-12-10 DIAGNOSIS — E119 Type 2 diabetes mellitus without complications: Secondary | ICD-10-CM | POA: Diagnosis not present

## 2023-12-10 DIAGNOSIS — M48062 Spinal stenosis, lumbar region with neurogenic claudication: Secondary | ICD-10-CM | POA: Diagnosis not present

## 2023-12-10 DIAGNOSIS — F319 Bipolar disorder, unspecified: Secondary | ICD-10-CM | POA: Diagnosis not present

## 2023-12-10 DIAGNOSIS — B379 Candidiasis, unspecified: Secondary | ICD-10-CM | POA: Diagnosis not present

## 2023-12-10 DIAGNOSIS — M4726 Other spondylosis with radiculopathy, lumbar region: Secondary | ICD-10-CM | POA: Diagnosis not present

## 2023-12-10 DIAGNOSIS — M47812 Spondylosis without myelopathy or radiculopathy, cervical region: Secondary | ICD-10-CM | POA: Diagnosis not present

## 2023-12-11 DIAGNOSIS — F319 Bipolar disorder, unspecified: Secondary | ICD-10-CM | POA: Diagnosis not present

## 2023-12-11 DIAGNOSIS — M797 Fibromyalgia: Secondary | ICD-10-CM | POA: Diagnosis not present

## 2023-12-11 DIAGNOSIS — E119 Type 2 diabetes mellitus without complications: Secondary | ICD-10-CM | POA: Diagnosis not present

## 2023-12-11 DIAGNOSIS — Z604 Social exclusion and rejection: Secondary | ICD-10-CM | POA: Diagnosis not present

## 2023-12-11 DIAGNOSIS — I10 Essential (primary) hypertension: Secondary | ICD-10-CM | POA: Diagnosis not present

## 2023-12-11 DIAGNOSIS — M4726 Other spondylosis with radiculopathy, lumbar region: Secondary | ICD-10-CM | POA: Diagnosis not present

## 2023-12-11 DIAGNOSIS — Z9089 Acquired absence of other organs: Secondary | ICD-10-CM | POA: Diagnosis not present

## 2023-12-11 DIAGNOSIS — Z794 Long term (current) use of insulin: Secondary | ICD-10-CM | POA: Diagnosis not present

## 2023-12-11 DIAGNOSIS — M419 Scoliosis, unspecified: Secondary | ICD-10-CM | POA: Diagnosis not present

## 2023-12-11 DIAGNOSIS — Z87440 Personal history of urinary (tract) infections: Secondary | ICD-10-CM | POA: Diagnosis not present

## 2023-12-11 DIAGNOSIS — K589 Irritable bowel syndrome without diarrhea: Secondary | ICD-10-CM | POA: Diagnosis not present

## 2023-12-11 DIAGNOSIS — Z79891 Long term (current) use of opiate analgesic: Secondary | ICD-10-CM | POA: Diagnosis not present

## 2023-12-11 DIAGNOSIS — M47812 Spondylosis without myelopathy or radiculopathy, cervical region: Secondary | ICD-10-CM | POA: Diagnosis not present

## 2023-12-11 DIAGNOSIS — M48062 Spinal stenosis, lumbar region with neurogenic claudication: Secondary | ICD-10-CM | POA: Diagnosis not present

## 2023-12-11 DIAGNOSIS — Z79899 Other long term (current) drug therapy: Secondary | ICD-10-CM | POA: Diagnosis not present

## 2023-12-11 DIAGNOSIS — Z90710 Acquired absence of both cervix and uterus: Secondary | ICD-10-CM | POA: Diagnosis not present

## 2023-12-11 DIAGNOSIS — Z96641 Presence of right artificial hip joint: Secondary | ICD-10-CM | POA: Diagnosis not present

## 2023-12-11 DIAGNOSIS — L932 Other local lupus erythematosus: Secondary | ICD-10-CM | POA: Diagnosis not present

## 2023-12-11 DIAGNOSIS — Z7984 Long term (current) use of oral hypoglycemic drugs: Secondary | ICD-10-CM | POA: Diagnosis not present

## 2023-12-13 ENCOUNTER — Encounter: Payer: Self-pay | Admitting: Endocrinology

## 2023-12-13 ENCOUNTER — Ambulatory Visit: Payer: Self-pay | Admitting: Endocrinology

## 2023-12-13 ENCOUNTER — Ambulatory Visit: Admitting: Endocrinology

## 2023-12-13 VITALS — BP 122/78 | HR 73 | Resp 20 | Ht 60.0 in | Wt 159.6 lb

## 2023-12-13 DIAGNOSIS — E119 Type 2 diabetes mellitus without complications: Secondary | ICD-10-CM

## 2023-12-13 DIAGNOSIS — E042 Nontoxic multinodular goiter: Secondary | ICD-10-CM

## 2023-12-13 DIAGNOSIS — M48062 Spinal stenosis, lumbar region with neurogenic claudication: Secondary | ICD-10-CM | POA: Diagnosis not present

## 2023-12-13 DIAGNOSIS — M25531 Pain in right wrist: Secondary | ICD-10-CM | POA: Diagnosis not present

## 2023-12-13 DIAGNOSIS — E1169 Type 2 diabetes mellitus with other specified complication: Secondary | ICD-10-CM

## 2023-12-13 DIAGNOSIS — Z794 Long term (current) use of insulin: Secondary | ICD-10-CM | POA: Diagnosis not present

## 2023-12-13 DIAGNOSIS — F319 Bipolar disorder, unspecified: Secondary | ICD-10-CM | POA: Diagnosis not present

## 2023-12-13 DIAGNOSIS — E039 Hypothyroidism, unspecified: Secondary | ICD-10-CM | POA: Diagnosis not present

## 2023-12-13 DIAGNOSIS — M47812 Spondylosis without myelopathy or radiculopathy, cervical region: Secondary | ICD-10-CM | POA: Diagnosis not present

## 2023-12-13 DIAGNOSIS — M797 Fibromyalgia: Secondary | ICD-10-CM | POA: Diagnosis not present

## 2023-12-13 DIAGNOSIS — M4726 Other spondylosis with radiculopathy, lumbar region: Secondary | ICD-10-CM | POA: Diagnosis not present

## 2023-12-13 LAB — POCT GLYCOSYLATED HEMOGLOBIN (HGB A1C): Hemoglobin A1C: 6.3 % — AB (ref 4.0–5.6)

## 2023-12-13 MED ORDER — LANTUS SOLOSTAR 100 UNIT/ML ~~LOC~~ SOPN: 50.0000 [IU] | PEN_INJECTOR | Freq: Every day | SUBCUTANEOUS | 4 refills | Status: DC
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
## 2023-12-15 DIAGNOSIS — F319 Bipolar disorder, unspecified: Secondary | ICD-10-CM | POA: Diagnosis not present

## 2023-12-15 DIAGNOSIS — E119 Type 2 diabetes mellitus without complications: Secondary | ICD-10-CM | POA: Diagnosis not present

## 2023-12-15 DIAGNOSIS — M4726 Other spondylosis with radiculopathy, lumbar region: Secondary | ICD-10-CM | POA: Diagnosis not present

## 2023-12-15 DIAGNOSIS — M48062 Spinal stenosis, lumbar region with neurogenic claudication: Secondary | ICD-10-CM | POA: Diagnosis not present

## 2023-12-15 DIAGNOSIS — M47812 Spondylosis without myelopathy or radiculopathy, cervical region: Secondary | ICD-10-CM | POA: Diagnosis not present

## 2023-12-15 DIAGNOSIS — M797 Fibromyalgia: Secondary | ICD-10-CM | POA: Diagnosis not present

## 2023-12-17 DIAGNOSIS — M797 Fibromyalgia: Secondary | ICD-10-CM | POA: Diagnosis not present

## 2023-12-17 DIAGNOSIS — M4726 Other spondylosis with radiculopathy, lumbar region: Secondary | ICD-10-CM | POA: Diagnosis not present

## 2023-12-17 DIAGNOSIS — F319 Bipolar disorder, unspecified: Secondary | ICD-10-CM | POA: Diagnosis not present

## 2023-12-17 DIAGNOSIS — M48062 Spinal stenosis, lumbar region with neurogenic claudication: Secondary | ICD-10-CM | POA: Diagnosis not present

## 2023-12-17 DIAGNOSIS — E119 Type 2 diabetes mellitus without complications: Secondary | ICD-10-CM | POA: Diagnosis not present

## 2023-12-17 DIAGNOSIS — M47812 Spondylosis without myelopathy or radiculopathy, cervical region: Secondary | ICD-10-CM | POA: Diagnosis not present

## 2023-12-20 DIAGNOSIS — M48062 Spinal stenosis, lumbar region with neurogenic claudication: Secondary | ICD-10-CM | POA: Diagnosis not present

## 2023-12-20 DIAGNOSIS — F319 Bipolar disorder, unspecified: Secondary | ICD-10-CM | POA: Diagnosis not present

## 2023-12-20 DIAGNOSIS — M797 Fibromyalgia: Secondary | ICD-10-CM | POA: Diagnosis not present

## 2023-12-20 DIAGNOSIS — M47812 Spondylosis without myelopathy or radiculopathy, cervical region: Secondary | ICD-10-CM | POA: Diagnosis not present

## 2023-12-20 DIAGNOSIS — E119 Type 2 diabetes mellitus without complications: Secondary | ICD-10-CM | POA: Diagnosis not present

## 2023-12-20 DIAGNOSIS — M4726 Other spondylosis with radiculopathy, lumbar region: Secondary | ICD-10-CM | POA: Diagnosis not present

## 2023-12-27 DIAGNOSIS — E119 Type 2 diabetes mellitus without complications: Secondary | ICD-10-CM | POA: Diagnosis not present

## 2023-12-27 DIAGNOSIS — M48062 Spinal stenosis, lumbar region with neurogenic claudication: Secondary | ICD-10-CM | POA: Diagnosis not present

## 2023-12-27 DIAGNOSIS — M47812 Spondylosis without myelopathy or radiculopathy, cervical region: Secondary | ICD-10-CM | POA: Diagnosis not present

## 2023-12-27 DIAGNOSIS — M4726 Other spondylosis with radiculopathy, lumbar region: Secondary | ICD-10-CM | POA: Diagnosis not present

## 2023-12-27 DIAGNOSIS — F319 Bipolar disorder, unspecified: Secondary | ICD-10-CM | POA: Diagnosis not present

## 2023-12-27 DIAGNOSIS — M797 Fibromyalgia: Secondary | ICD-10-CM | POA: Diagnosis not present

## 2023-12-29 ENCOUNTER — Other Ambulatory Visit: Payer: Self-pay

## 2023-12-29 DIAGNOSIS — E1165 Type 2 diabetes mellitus with hyperglycemia: Secondary | ICD-10-CM

## 2023-12-29 MED ORDER — LIOTHYRONINE SODIUM 5 MCG PO TABS: ORAL_TABLET | ORAL | 3 refills | Status: DC

## 2023-12-29 MED ORDER — LEVOTHYROXINE SODIUM 75 MCG PO TABS: 75.0000 ug | ORAL_TABLET | Freq: Every day | ORAL | 3 refills | Status: DC

## 2023-12-30 DIAGNOSIS — F319 Bipolar disorder, unspecified: Secondary | ICD-10-CM | POA: Diagnosis not present

## 2023-12-30 DIAGNOSIS — M5459 Other low back pain: Secondary | ICD-10-CM | POA: Diagnosis not present

## 2023-12-30 DIAGNOSIS — M797 Fibromyalgia: Secondary | ICD-10-CM | POA: Diagnosis not present

## 2023-12-30 DIAGNOSIS — Z5181 Encounter for therapeutic drug level monitoring: Secondary | ICD-10-CM | POA: Diagnosis not present

## 2023-12-30 DIAGNOSIS — G894 Chronic pain syndrome: Secondary | ICD-10-CM | POA: Diagnosis not present

## 2023-12-30 DIAGNOSIS — M4726 Other spondylosis with radiculopathy, lumbar region: Secondary | ICD-10-CM | POA: Diagnosis not present

## 2023-12-30 DIAGNOSIS — G629 Polyneuropathy, unspecified: Secondary | ICD-10-CM | POA: Diagnosis not present

## 2023-12-30 DIAGNOSIS — M51362 Other intervertebral disc degeneration, lumbar region with discogenic back pain and lower extremity pain: Secondary | ICD-10-CM | POA: Diagnosis not present

## 2023-12-30 DIAGNOSIS — E119 Type 2 diabetes mellitus without complications: Secondary | ICD-10-CM | POA: Diagnosis not present

## 2023-12-30 DIAGNOSIS — M48062 Spinal stenosis, lumbar region with neurogenic claudication: Secondary | ICD-10-CM | POA: Diagnosis not present

## 2023-12-30 DIAGNOSIS — Z79891 Long term (current) use of opiate analgesic: Secondary | ICD-10-CM | POA: Diagnosis not present

## 2023-12-30 DIAGNOSIS — M47812 Spondylosis without myelopathy or radiculopathy, cervical region: Secondary | ICD-10-CM | POA: Diagnosis not present

## 2023-12-31 DIAGNOSIS — M797 Fibromyalgia: Secondary | ICD-10-CM | POA: Diagnosis not present

## 2023-12-31 DIAGNOSIS — M47812 Spondylosis without myelopathy or radiculopathy, cervical region: Secondary | ICD-10-CM | POA: Diagnosis not present

## 2023-12-31 DIAGNOSIS — M4726 Other spondylosis with radiculopathy, lumbar region: Secondary | ICD-10-CM | POA: Diagnosis not present

## 2023-12-31 DIAGNOSIS — E119 Type 2 diabetes mellitus without complications: Secondary | ICD-10-CM | POA: Diagnosis not present

## 2023-12-31 DIAGNOSIS — F319 Bipolar disorder, unspecified: Secondary | ICD-10-CM | POA: Diagnosis not present

## 2023-12-31 DIAGNOSIS — M48062 Spinal stenosis, lumbar region with neurogenic claudication: Secondary | ICD-10-CM | POA: Diagnosis not present

## 2024-01-03 DIAGNOSIS — M4726 Other spondylosis with radiculopathy, lumbar region: Secondary | ICD-10-CM | POA: Diagnosis not present

## 2024-01-03 DIAGNOSIS — R197 Diarrhea, unspecified: Secondary | ICD-10-CM | POA: Diagnosis not present

## 2024-01-03 DIAGNOSIS — F319 Bipolar disorder, unspecified: Secondary | ICD-10-CM | POA: Diagnosis not present

## 2024-01-03 DIAGNOSIS — E119 Type 2 diabetes mellitus without complications: Secondary | ICD-10-CM | POA: Diagnosis not present

## 2024-01-03 DIAGNOSIS — M47812 Spondylosis without myelopathy or radiculopathy, cervical region: Secondary | ICD-10-CM | POA: Diagnosis not present

## 2024-01-03 DIAGNOSIS — M48062 Spinal stenosis, lumbar region with neurogenic claudication: Secondary | ICD-10-CM | POA: Diagnosis not present

## 2024-01-03 DIAGNOSIS — M797 Fibromyalgia: Secondary | ICD-10-CM | POA: Diagnosis not present

## 2024-01-05 ENCOUNTER — Telehealth: Payer: Self-pay

## 2024-01-05 ENCOUNTER — Other Ambulatory Visit: Payer: Self-pay | Admitting: Gastroenterology

## 2024-01-05 DIAGNOSIS — R1084 Generalized abdominal pain: Secondary | ICD-10-CM

## 2024-01-05 DIAGNOSIS — R634 Abnormal weight loss: Secondary | ICD-10-CM | POA: Diagnosis not present

## 2024-01-05 NOTE — Telephone Encounter (Signed)
 Patient called regarding concern for hypoglycemia.  Freestyle libre 3 CGM data downloaded and reviewed from October and 16 to October 29.  Starting October 21 patient has occasional hypoglycemia with blood sugar in 60s in between the meals and overnight.  Plan: Decrease Lantus  from 50 to 40 units daily. Continue Humalog  sliding scale as needed.  Nashali Ditmer, MD [facility] Endocrinology [facility] [address] [place], [address] [place], [place] [address] Phone # [phone]

## 2024-01-05 NOTE — Telephone Encounter (Signed)
 Patient left message stating she has been having low blood glucose levels in 60s and 70s. Mychart message sent to obtain more info to better assist MD as well as CGM printed.

## 2024-01-05 NOTE — Telephone Encounter (Signed)
-----   Message from Sudan Thapa, MD sent at 01/05/2024  1:22 PM EDT -----

## 2024-01-05 NOTE — Telephone Encounter (Signed)
Patient made aware- no further questions at this time.

## 2024-01-06 DIAGNOSIS — M1990 Unspecified osteoarthritis, unspecified site: Secondary | ICD-10-CM | POA: Diagnosis not present

## 2024-01-06 DIAGNOSIS — M79641 Pain in right hand: Secondary | ICD-10-CM | POA: Diagnosis not present

## 2024-01-06 DIAGNOSIS — R634 Abnormal weight loss: Secondary | ICD-10-CM | POA: Diagnosis not present

## 2024-01-06 DIAGNOSIS — M79672 Pain in left foot: Secondary | ICD-10-CM | POA: Diagnosis not present

## 2024-01-06 DIAGNOSIS — M79671 Pain in right foot: Secondary | ICD-10-CM | POA: Diagnosis not present

## 2024-01-06 DIAGNOSIS — M2550 Pain in unspecified joint: Secondary | ICD-10-CM | POA: Diagnosis not present

## 2024-01-06 DIAGNOSIS — M79643 Pain in unspecified hand: Secondary | ICD-10-CM | POA: Diagnosis not present

## 2024-01-06 DIAGNOSIS — M79642 Pain in left hand: Secondary | ICD-10-CM | POA: Diagnosis not present

## 2024-01-07 ENCOUNTER — Ambulatory Visit
Admission: RE | Admit: 2024-01-07 | Discharge: 2024-01-07 | Disposition: A | Discharge status: Home / Self Care | Source: Ambulatory Visit | Attending: Gastroenterology | Admitting: Gastroenterology

## 2024-01-07 DIAGNOSIS — R109 Unspecified abdominal pain: Secondary | ICD-10-CM | POA: Diagnosis not present

## 2024-01-07 DIAGNOSIS — R1084 Generalized abdominal pain: Secondary | ICD-10-CM

## 2024-01-07 DIAGNOSIS — R634 Abnormal weight loss: Secondary | ICD-10-CM

## 2024-01-07 MED ORDER — IOPAMIDOL (ISOVUE-300) INJECTION 61%
80.0000 mL | Freq: Once | INTRAVENOUS | Status: AC | PRN
  Administered 2024-01-07: 80 mL via INTRAVENOUS

## 2024-01-10 ENCOUNTER — Encounter: Payer: Self-pay | Admitting: Physician Assistant

## 2024-01-10 ENCOUNTER — Ambulatory Visit: Admitting: Physician Assistant

## 2024-01-10 DIAGNOSIS — F319 Bipolar disorder, unspecified: Secondary | ICD-10-CM | POA: Diagnosis not present

## 2024-01-10 DIAGNOSIS — F4323 Adjustment disorder with mixed anxiety and depressed mood: Secondary | ICD-10-CM

## 2024-01-10 MED ORDER — DIVALPROEX SODIUM 250 MG PO DR TAB: DELAYED_RELEASE_TABLET | ORAL | Status: AC

## 2024-01-10 NOTE — Progress Notes (Signed)
 [facility]  Patient ID: [name] [name],  MRN: [id number]  PCP: Kip Righter, MD  Date of Evaluation: 01/10/2024 Time spent:25 minutes  Chief Complaint:  Chief Complaint   Anxiety; Follow-up    Virtual Visit via Telehealth  I connected with patient by telephone, with their informed consent, and verified patient privacy and that I am speaking with the correct person using two identifiers.  I am private, in my office and the patient is at home.  I discussed the limitations, risks, security and privacy concerns of performing an evaluation and management service by telephone and the availability of in person appointments. I also discussed with the patient that there may be a patient responsible charge related to this service. The patient expressed understanding and agreed to proceed.   I discussed the assessment and treatment plan with the patient. The patient was provided an opportunity to ask questions and all were answered. The patient agreed with the plan and demonstrated an understanding of the instructions.   The patient was advised to call back or seek an in-person evaluation if the symptoms worsen or if the condition fails to improve as anticipated.  I provided approximately 25 minutes of non-face-to-face time during this encounter.  HISTORY/CURRENT STATUS: HPI For routine med check.   For about 3 weeks, she's been more anxious and overwhelmed.  Feels panicky at times but no physical symptoms.  She is having a lot company this holiday season and is trying to get ready for guests.  She has bought a lot of things to decorate with, but she has always enjoyed that.  She has racing thoughts at times, thinking about everything she has to do.  She also has a lot of pain from the fibromyalgia.  Her pain management provider increased the Cymbalta a few weeks ago.  She is not sure how much it is helping so far.  No increased energy with decreased need for sleep, increased  talkativeness, impulsivity or risky behaviors, grandiosity, increased irritability or anger, paranoia, or hallucinations.  States she feels down and sad at times.  Thinks it may be related to her physical issues though.  Gets discouraged because of the physical pain from fibromyalgia.  Sleeps okay.  Personal hygiene is normal.  Denies any changes in concentration, making decisions, or remembering things.  Appetite has not changed.  No SI/HI.  Individual Medical History/ Review of Systems: Changes? :No   Past Medication Trials: Tegretol -adverse reaction Lamictal-itching, swollen lymph nodes Trileptal-ineffective Depakote - has taken long-term with some benefit Gabapentin -prescribed for RLS, pain, and anxiety.  Unable to tolerate doses above 400 mg 3 times daily Topamax Gabitril Lyrica Keppra Zonegran Sonata -intermittently effective Xanax -effective Ativan  Valium  Klonopin -patient reports feeling peculiar Lithium -has taken long-term with some improvement.  Unable to tolerate doses greater than 450 mg Seroquel - helpful for racing thoughts and mood signs and symptoms.  Daytime somnolence with higher doses.  Has caused RLS without Requip . Geodon-tolerability issues Rexulti-EPS Latuda-EPS Vraylar-EPS Saphris-excessive sedation and severe RLS Abilify -may have exacerbated gambling Risperdal Olanzapine - RLS, increased appetite, wt gain Perphenazine Loxapine - severe adverse effects at low dose. Had weakness, twitches BuSpar -ineffective Prozac -effective and then stopped working Zoloft-could not tolerate Lexapro -has used short-term to alleviate depressive signs and symptoms Remeron  Wellbutrin Vistaril - ineffective Requip - takes due to restless legs secondary to Seroquel .  Has taken long-term and reports being on higher doses in the past.  Denies correlation between Requip  and  gambling. Pramipexole NAC Deplin Buprenorphine  Namenda  Verapamil  Pindolol Isradapine Clonidine  Lunesta-ineffective Belsomra- ineffective Dayvigo - Legs buckled  up. Ambien  Ambien  CR-in effective Trazodone-nightmares Doxepin - ineffective Remeron   Allergies: Codeine; Procaine hcl; Epinephrine ; Ozempic  (0.25 or 0.5 mg-dose) [semaglutide (0.25 or 0.5mg -dos)]; Aspirin ; Augmentin  [amoxicillin -pot clavulanate]; Benadryl  [diphenhydramine ]; Compazine  [prochlorperazine ]; Diflucan  [fluconazole ]; Dilaudid  [hydromorphone  hcl]; Hydromorphone ; Hydroxyzine ; Morphine ; Other; Prednisone ; Tramadol  hcl; Antihistamines, loratadine -type; Macrobid  [nitrofurantoin ]; and Sulfa antibiotics  Current Medications:  Current Outpatient Medications:    acetaminophen  (TYLENOL ) 500 MG tablet, Take 1 tablet (500 mg total) by mouth every 6 (six) hours as needed., Disp: 30 tablet, Rfl: 0   albuterol  (VENTOLIN  HFA) 108 (90 Base) MCG/ACT inhaler, Inhale 1-2 puffs into the lungs every 6 (six) hours as needed for wheezing or shortness of breath., Disp: , Rfl:    atorvastatin  (LIPITOR) 40 MG tablet, Take 1 tablet (40 mg total) by mouth See admin instructions. Take 40 mg by mouth at 5 PM, Disp: , Rfl: 0   b complex vitamins capsule, Take 1 capsule by mouth daily., Disp: , Rfl:    bismuth subsalicylate (PEPTO BISMOL) 262 MG/15ML suspension, Take 30 mLs by mouth every 6 (six) hours as needed., Disp: , Rfl:    cholecalciferol  (VITAMIN D3) 25 MCG (1000 UNIT) tablet, Take 1,000-2,000 Units by mouth See admin instructions. Take 1,000 units by mouth in the morning and 2,000 units at 5 PM, Disp: , Rfl:    Continuous Glucose Receiver (FREESTYLE LIBRE 3 READER) DEVI, 1 each by Does not apply route continuous. To use with freestyle libre 3+ sensor., Disp: 1 each, Rfl: 0   Continuous Glucose Sensor (FREESTYLE LIBRE 3 PLUS SENSOR) MISC, 1 each by Does not apply route continuous. Change every 15 days., Disp: 6 each, Rfl: 3   Continuous  Glucose Sensor (FREESTYLE LIBRE 3 PLUS SENSOR) MISC, Change every 15 days, Disp: , Rfl:    CONTOUR NEXT TEST test strip, USE TO TEST BLOOD SUGAR TWICE DAILY, Disp: 100 strip, Rfl: 3   cyclobenzaprine  (FLEXERIL ) 5 MG tablet, Take 5-10 mg by mouth every 8 (eight) hours as needed., Disp: , Rfl:    diclofenac  (VOLTAREN ) 75 MG EC tablet, Take 75 mg by mouth as needed., Disp: , Rfl:    ezetimibe  (ZETIA ) 10 MG tablet, Take 1 tablet (10 mg total) by mouth every evening., Disp: , Rfl:    fluticasone  (FLONASE ) 50 MCG/ACT nasal spray, Place 1 spray into both nostrils 2 (two) times daily as needed for allergies or rhinitis., Disp: , Rfl:    gabapentin  (NEURONTIN ) 600 MG tablet, Take 600 mg by mouth 3 (three) times daily. Take 600 mg by mouth in the morning, at 5 PM, and at bedtime, Disp: , Rfl:    ibuprofen  (ADVIL ) 600 MG tablet, Take 1 tablet (600 mg total) by mouth 4 (four) times daily., Disp: 20 tablet, Rfl: 0   insulin  glargine (LANTUS  SOLOSTAR) 100 UNIT/ML Solostar Pen, Inject 50 Units into the skin daily. (Patient taking differently: Inject 40 Units into the skin daily.), Disp: 30 mL, Rfl: 4   Insulin  Pen Needle (BD PEN NEEDLE NANO 2ND GEN) 32G X 4 MM MISC, Use to Inject 5 times daily as directed, Disp: 300 each, Rfl: 3   levothyroxine  (SYNTHROID ) 75 MCG tablet, Take 1 tablet (75 mcg total) by mouth daily before breakfast., Disp: 90 tablet, Rfl: 3   lidocaine  (XYLOCAINE ) 2 % solution, Use as directed 15 mLs in the mouth or throat as directed., Disp: , Rfl:    LINZESS 145 MCG CAPS capsule, TAKE 1 CAPSULE BY MOUTH DAILY 30 MINUTES BEFORE FIRST MEAL OF THE DAY ON AN EMPTY STOMACH for  30, Disp: , Rfl:    liothyronine  (CYTOMEL ) 5 MCG tablet, TAKE 1 TABLET BY MOUTH DAILY WITH LEVOTHYROXINE   TAKE BEFORE BREAKFAST, Disp: 90 tablet, Rfl: 3   loperamide  (IMODIUM ) 2 MG capsule, Take 2 capsules (4 mg total) by mouth 3 (three) times daily as needed for diarrhea or loose stools., Disp: 30 capsule, Rfl: 0   losartan   (COZAAR ) 50 MG tablet, Take 50 mg by mouth See admin instructions., Disp: , Rfl:    Microlet Lancets MISC, TEST DAILY AS DIRECTED, Disp: 100 each, Rfl: 3   ondansetron  (ZOFRAN ) 4 MG tablet, Take 1 tablet (4 mg total) by mouth every 6 (six) hours., Disp: 12 tablet, Rfl: 0   oxyCODONE -acetaminophen  (PERCOCET) 7.5-325 MG tablet, Take 1 tablet by mouth every 8 (eight) hours as needed., Disp: , Rfl:    pantoprazole  (PROTONIX ) 40 MG tablet, Take 1 tablet (40 mg total) by mouth daily at 6 (six) AM., Disp: 30 tablet, Rfl: 0   rOPINIRole  (REQUIP ) 0.5 MG tablet, TAKE 4 TABLETS BY MOUTH AT 5PM, 3 TABLETS BY MOUTH AT BEDTIME, AND 1-2 TABLETS DAILY AS NEEDED, Disp: 720 tablet, Rfl: 3   calcium  carbonate (TUMS EX) 750 MG chewable tablet, Chew 1 tablet by mouth as needed., Disp: , Rfl:    dicyclomine  (BENTYL ) 20 MG tablet, Take 1 tablet (20 mg total) by mouth 3 (three) times daily as needed for spasms., Disp: 20 tablet, Rfl: 0   divalproex  (DEPAKOTE ) 250 MG DR tablet, 1 po q am, 2 po at bedtime., Disp: , Rfl:    DULoxetine (CYMBALTA) 30 MG capsule, 1 capsule., Disp: , Rfl:    FUROSEMIDE  PO, Take by mouth as needed., Disp: , Rfl:    insulin  lispro (HUMALOG  KWIKPEN) 100 UNIT/ML KwikPen, Inject 14-18 Units into the skin 2 (two) times daily before a meal. (Patient taking differently: Inject 14-18 Units into the skin 2 (two) times daily before a meal. Patient takes as needed), Disp: 15 mL, Rfl: 1   Ipratropium-Albuterol  (COMBIVENT  RESPIMAT) 20-100 MCG/ACT AERS respimat, Inhale 1 puff into the lungs in the morning, at noon, and at bedtime for 7 days, THEN 1 puff every 6 (six) hours as needed for wheezing., Disp: 1 each, Rfl: 0   loratadine  (CLARITIN ) 10 MG tablet, Take 10 mg by mouth daily as needed for allergies., Disp: , Rfl:    methocarbamol  (ROBAXIN ) 750 MG tablet, Take 1 tablet (750 mg total) by mouth 3 (three) times daily as needed (muscle spasm/pain)., Disp: 15 tablet, Rfl: 0   nitroGLYCERIN  (NITROSTAT ) 0.4 MG SL  tablet, Place 1 tablet (0.4 mg total) under the tongue every 5 (five) minutes as needed for chest pain., Disp: 25 tablet, Rfl: 2   Wheat Dextrin (BENEFIBER PO), Take 4-5 g by mouth daily as needed (for constipation- mix as directed)., Disp: , Rfl:  Medication Side Effects: none  Family Medical/ Social History: Changes? No  MENTAL HEALTH EXAM:  There were no vitals taken for this visit.There is no height or weight on file to calculate BMI.  General Appearance: Unable to assess  Eye Contact:  Unable to assess  Speech:  Clear and Coherent and Normal Rate  Volume:  Normal  Mood:  Euthymic  Affect:  Unable to assess  Thought Process:  Goal Directed and Descriptions of Associations: Circumstantial  Orientation:  Full (Time, Place, and Person)  Thought Content: Logical   Suicidal Thoughts:  No  Homicidal Thoughts:  No  Memory:  WNL  Judgement:  Good  Insight:  Good  Psychomotor Activity: Unable to assess  Concentration:  Concentration: Good  Recall:  Good  Fund of Knowledge: Good  Language: Good  Assets:  Communication Skills Desire for Improvement Financial Resources/Insurance Housing Resilience Social Support Transportation  ADL's:  Intact  Cognition: WNL  Prognosis:  Good   DIAGNOSES:    ICD-10-CM   1. Situational mixed anxiety and depressive disorder  F43.23     2. Bipolar 1 disorder ([name])  F31.9 divalproex  (DEPAKOTE ) 250 MG DR tablet      Receiving Psychotherapy: Yes  Deb Young  RECOMMENDATIONS:   PDMP reviewed.  Oxycodone  filled 12/30/2023.  Gabapentin  filled 12/30/2023.    Xanax  was last filled 12/31/2022.  I provided approximately  25 minutes of non-face-to-face time during this encounter, including time spent before and after the visit in records review, medical decision making, counseling pertinent to today's visit, and charting.   We discussed her mood.  I believe it is more situational and once the holidays are over she will likely get back to baseline.  In  the meantime I think increasing the Depakote  will be beneficial.  She understands and agrees with this plan.  Increase Depakote  250 mg, to 1 q am and continue 2 at bedtime. Continue Cymbalta 30 mg daily for pain management. Continue gabapentin  600 mg, 1 po tid. Continue ropinerole 0.5 mg,4 po at 5 PM, 3 po at bedtime, and 1-2 during day prn.  Continue counseling with Haroldine Salt.  Return in 6 weeks.  Verneita Cooks, PA-C

## 2024-01-11 ENCOUNTER — Telehealth: Payer: Self-pay | Admitting: Endocrinology

## 2024-01-11 DIAGNOSIS — Z794 Long term (current) use of insulin: Secondary | ICD-10-CM

## 2024-01-11 MED ORDER — LANTUS SOLOSTAR 100 UNIT/ML ~~LOC~~ SOPN: 30.0000 [IU] | PEN_INJECTOR | Freq: Every day | SUBCUTANEOUS | 4 refills | Status: DC

## 2024-01-11 NOTE — Telephone Encounter (Signed)
 Called patient no answer left voice message to call back.  If patient is having low blood sugar in 50 and 60 range high recommend to decrease Lantus  from 40 to 30 units daily.  Della Scrivener, MD [facility] [address], [address] [place], [place] [address] Phone # [phone]

## 2024-01-11 NOTE — Telephone Encounter (Signed)
 Patient called this morning and states that her blood sugar levels are dropping into the 50's and needs advice on what to do.Please advise

## 2024-01-12 NOTE — Telephone Encounter (Signed)
 Patient returned Dr. Eugenio call from yesterday regarding blood sugars in the 50 and 60 range.    Call patient at [phone]

## 2024-01-12 NOTE — Telephone Encounter (Signed)
 Pt has been notified and voices understanding.

## 2024-01-14 ENCOUNTER — Ambulatory Visit: Admitting: Podiatry

## 2024-01-19 ENCOUNTER — Ambulatory Visit: Admitting: Podiatry

## 2024-01-24 DIAGNOSIS — M2550 Pain in unspecified joint: Secondary | ICD-10-CM | POA: Diagnosis not present

## 2024-01-27 DIAGNOSIS — Z79891 Long term (current) use of opiate analgesic: Secondary | ICD-10-CM | POA: Diagnosis not present

## 2024-01-27 DIAGNOSIS — M62838 Other muscle spasm: Secondary | ICD-10-CM | POA: Diagnosis not present

## 2024-01-27 DIAGNOSIS — M5459 Other low back pain: Secondary | ICD-10-CM | POA: Diagnosis not present

## 2024-01-27 DIAGNOSIS — Z5181 Encounter for therapeutic drug level monitoring: Secondary | ICD-10-CM | POA: Diagnosis not present

## 2024-01-27 DIAGNOSIS — F4542 Pain disorder with related psychological factors: Secondary | ICD-10-CM | POA: Diagnosis not present

## 2024-01-27 DIAGNOSIS — M51362 Other intervertebral disc degeneration, lumbar region with discogenic back pain and lower extremity pain: Secondary | ICD-10-CM | POA: Diagnosis not present

## 2024-01-27 DIAGNOSIS — G894 Chronic pain syndrome: Secondary | ICD-10-CM | POA: Diagnosis not present

## 2024-01-27 DIAGNOSIS — F319 Bipolar disorder, unspecified: Secondary | ICD-10-CM | POA: Diagnosis not present

## 2024-01-27 DIAGNOSIS — M797 Fibromyalgia: Secondary | ICD-10-CM | POA: Diagnosis not present

## 2024-01-27 DIAGNOSIS — G629 Polyneuropathy, unspecified: Secondary | ICD-10-CM | POA: Diagnosis not present

## 2024-02-07 DIAGNOSIS — M1990 Unspecified osteoarthritis, unspecified site: Secondary | ICD-10-CM | POA: Diagnosis not present

## 2024-02-07 DIAGNOSIS — M5459 Other low back pain: Secondary | ICD-10-CM | POA: Diagnosis not present

## 2024-02-07 DIAGNOSIS — Z79891 Long term (current) use of opiate analgesic: Secondary | ICD-10-CM | POA: Diagnosis not present

## 2024-02-07 DIAGNOSIS — M2550 Pain in unspecified joint: Secondary | ICD-10-CM | POA: Diagnosis not present

## 2024-02-07 DIAGNOSIS — M47816 Spondylosis without myelopathy or radiculopathy, lumbar region: Secondary | ICD-10-CM | POA: Diagnosis not present

## 2024-02-07 DIAGNOSIS — G894 Chronic pain syndrome: Secondary | ICD-10-CM | POA: Diagnosis not present

## 2024-02-07 DIAGNOSIS — Z5181 Encounter for therapeutic drug level monitoring: Secondary | ICD-10-CM | POA: Diagnosis not present

## 2024-02-16 ENCOUNTER — Other Ambulatory Visit: Payer: Self-pay | Admitting: Endocrinology

## 2024-02-16 DIAGNOSIS — E1165 Type 2 diabetes mellitus with hyperglycemia: Secondary | ICD-10-CM

## 2024-02-21 ENCOUNTER — Encounter: Payer: Self-pay | Admitting: Physician Assistant

## 2024-02-21 ENCOUNTER — Ambulatory Visit: Admitting: Physician Assistant

## 2024-02-21 DIAGNOSIS — G2581 Restless legs syndrome: Secondary | ICD-10-CM

## 2024-02-21 DIAGNOSIS — G894 Chronic pain syndrome: Secondary | ICD-10-CM | POA: Diagnosis not present

## 2024-02-21 DIAGNOSIS — F319 Bipolar disorder, unspecified: Secondary | ICD-10-CM | POA: Diagnosis not present

## 2024-02-21 DIAGNOSIS — F4323 Adjustment disorder with mixed anxiety and depressed mood: Secondary | ICD-10-CM

## 2024-02-21 NOTE — Progress Notes (Signed)
 [facility]  Patient ID: [name] [name],  MRN: [id number]  PCP: Kip Righter, MD  Date of Evaluation: 02/21/2024 Time spent:30 minutes  Chief Complaint:  Chief Complaint   Follow-up    Virtual Visit via Telehealth  I connected with patient by telephone, with their informed consent, and verified patient privacy and that I am speaking with the correct person using two identifiers.  I am private, in my office and the patient is at home.  I discussed the limitations, risks, security and privacy concerns of performing an evaluation and management service by telephone and the availability of in person appointments. I also discussed with the patient that there may be a patient responsible charge related to this service. The patient expressed understanding and agreed to proceed.   I discussed the assessment and treatment plan with the patient. The patient was provided an opportunity to ask questions and all were answered. The patient agreed with the plan and demonstrated an understanding of the instructions.   The patient was advised to call back or seek an in-person evaluation if the symptoms worsen or if the condition fails to improve as anticipated.  I provided approximately  30 minutes of non-face-to-face time during this encounter.  HISTORY/CURRENT STATUS: HPI For routine med check.   I'm struggling.  I've already had 3 events at our house and have another one or two next week. People are spending the night and I'm having to change the beds, buy groceries, it's a lot. Says she's very anxious all the time and not just about the holidays. Feels overwhelmed. No PA. Not depressed.  Energy and motivation are fair to good depending on the day.  No extreme sadness, tearfulness, or feelings of hopelessness.  Sleeps ok.  ADLs and personal hygiene are normal.   Denies any changes in concentration, making decisions, or remembering things.  Appetite has not changed.  Weight is  stable.   No mania, delirium, AH/VH.  No SI/HI.  Individual Medical History/ Review of Systems: Changes? :No   Past Medication Trials: Tegretol -adverse reaction Lamictal-itching, swollen lymph nodes Trileptal-ineffective Depakote - has taken long-term with some benefit Gabapentin -prescribed for RLS, pain, and anxiety.  Unable to tolerate doses above 400 mg 3 times daily Topamax Gabitril Lyrica Keppra Zonegran Sonata -intermittently effective Xanax -effective Ativan  Valium  Klonopin -patient reports feeling peculiar Lithium -has taken long-term with some improvement.  Unable to tolerate doses greater than 450 mg Seroquel - helpful for racing thoughts and mood signs and symptoms.  Daytime somnolence with higher doses.  Has caused RLS without Requip . Geodon-tolerability issues Rexulti-EPS Latuda-EPS Vraylar-EPS Saphris-excessive sedation and severe RLS Abilify -may have exacerbated gambling Risperdal Olanzapine - RLS, increased appetite, wt gain Perphenazine Loxapine - severe adverse effects at low dose. Had weakness, twitches BuSpar -ineffective Prozac -effective and then stopped working Zoloft-could not tolerate Lexapro -has used short-term to alleviate depressive signs and symptoms Remeron  Wellbutrin Vistaril - ineffective Requip - takes due to restless legs secondary to Seroquel .  Has taken long-term and reports being on higher doses in the past.  Denies correlation between Requip  and gambling. Pramipexole NAC Deplin Buprenorphine  Namenda  Verapamil  Pindolol Isradapine Clonidine  Lunesta-ineffective Belsomra- ineffective Dayvigo - Legs buckled up. Ambien  Ambien  CR-in effective Trazodone-nightmares Doxepin - ineffective Remeron   Allergies: Codeine; Procaine hcl; Epinephrine ; Ozempic  (0.25 or 0.5 mg-dose) [semaglutide (0.25 or 0.5mg -dos)]; Aspirin ; Augmentin  [amoxicillin -pot clavulanate]; Benadryl  [diphenhydramine ]; Compazine  [prochlorperazine ]; Diflucan  [fluconazole ];  Dilaudid  [hydromorphone  hcl]; Hydromorphone ; Hydroxyzine ; Morphine ; Other; Prednisone ; Tramadol  hcl; Antihistamines, loratadine -type; Macrobid  [nitrofurantoin ]; and Sulfa antibiotics  Current Medications:  Current Outpatient Medications:    acetaminophen  (TYLENOL ) 500 MG tablet, Take 1  tablet (500 mg total) by mouth every 6 (six) hours as needed., Disp: 30 tablet, Rfl: 0   albuterol  (VENTOLIN  HFA) 108 (90 Base) MCG/ACT inhaler, Inhale 1-2 puffs into the lungs every 6 (six) hours as needed for wheezing or shortness of breath., Disp: , Rfl:    atorvastatin  (LIPITOR) 40 MG tablet, Take 1 tablet (40 mg total) by mouth See admin instructions. Take 40 mg by mouth at 5 PM, Disp: , Rfl: 0   b complex vitamins capsule, Take 1 capsule by mouth daily., Disp: , Rfl:    bismuth subsalicylate (PEPTO BISMOL) 262 MG/15ML suspension, Take 30 mLs by mouth every 6 (six) hours as needed., Disp: , Rfl:    cholecalciferol  (VITAMIN D3) 25 MCG (1000 UNIT) tablet, Take 1,000-2,000 Units by mouth See admin instructions. Take 1,000 units by mouth in the morning and 2,000 units at 5 PM, Disp: , Rfl:    Continuous Glucose Receiver (FREESTYLE LIBRE 3 READER) DEVI, 1 each by Does not apply route continuous. To use with freestyle libre 3+ sensor., Disp: 1 each, Rfl: 0   Continuous Glucose Sensor (FREESTYLE LIBRE 3 PLUS SENSOR) MISC, 1 each by Does not apply route continuous. Change every 15 days., Disp: 6 each, Rfl: 3   Continuous Glucose Sensor (FREESTYLE LIBRE 3 PLUS SENSOR) MISC, Change every 15 days, Disp: , Rfl:    CONTOUR NEXT TEST test strip, USE TO TEST BLOOD SUGAR TWICE DAILY, Disp: 100 strip, Rfl: 3   cyclobenzaprine  (FLEXERIL ) 5 MG tablet, Take 5-10 mg by mouth every 8 (eight) hours as needed., Disp: , Rfl:    diclofenac  (VOLTAREN ) 75 MG EC tablet, Take 75 mg by mouth as needed., Disp: , Rfl:    dicyclomine  (BENTYL ) 20 MG tablet, Take 1 tablet (20 mg total) by mouth 3 (three) times daily as needed for spasms., Disp: 20  tablet, Rfl: 0   divalproex  (DEPAKOTE ) 250 MG DR tablet, 1 po q am, 2 po at bedtime., Disp: , Rfl:    DROPLET PEN NEEDLES 32G X 4 MM MISC, USE TO INJECT FIVE TIMES DAILY AS DIRECTED, Disp: 300 each, Rfl: 3   DULoxetine (CYMBALTA) 30 MG capsule, 1 capsule., Disp: , Rfl:    ezetimibe  (ZETIA ) 10 MG tablet, Take 1 tablet (10 mg total) by mouth every evening., Disp: , Rfl:    fluticasone  (FLONASE ) 50 MCG/ACT nasal spray, Place 1 spray into both nostrils 2 (two) times daily as needed for allergies or rhinitis., Disp: , Rfl:    FUROSEMIDE  PO, Take by mouth as needed., Disp: , Rfl:    gabapentin  (NEURONTIN ) 600 MG tablet, Take 600 mg by mouth 3 (three) times daily. Take 600 mg by mouth in the morning, at 5 PM, and at bedtime, Disp: , Rfl:    ibuprofen  (ADVIL ) 600 MG tablet, Take 1 tablet (600 mg total) by mouth 4 (four) times daily., Disp: 20 tablet, Rfl: 0   insulin  glargine (LANTUS  SOLOSTAR) 100 UNIT/ML Solostar Pen, Inject 30 Units into the skin daily., Disp: 30 mL, Rfl: 4   levothyroxine  (SYNTHROID ) 75 MCG tablet, Take 1 tablet (75 mcg total) by mouth daily before breakfast., Disp: 90 tablet, Rfl: 3   LINZESS 145 MCG CAPS capsule, TAKE 1 CAPSULE BY MOUTH DAILY 30 MINUTES BEFORE FIRST MEAL OF THE DAY ON AN EMPTY STOMACH for 30, Disp: , Rfl:    liothyronine  (CYTOMEL ) 5 MCG tablet, TAKE 1 TABLET BY MOUTH DAILY WITH LEVOTHYROXINE   TAKE BEFORE BREAKFAST, Disp: 90 tablet, Rfl: 3   loperamide  (  IMODIUM ) 2 MG capsule, Take 2 capsules (4 mg total) by mouth 3 (three) times daily as needed for diarrhea or loose stools., Disp: 30 capsule, Rfl: 0   loratadine  (CLARITIN ) 10 MG tablet, Take 10 mg by mouth daily as needed for allergies., Disp: , Rfl:    losartan  (COZAAR ) 50 MG tablet, Take 50 mg by mouth See admin instructions., Disp: , Rfl:    Microlet Lancets MISC, TEST DAILY AS DIRECTED, Disp: 100 each, Rfl: 3   nitroGLYCERIN  (NITROSTAT ) 0.4 MG SL tablet, Place 1 tablet (0.4 mg total) under the tongue every 5 (five)  minutes as needed for chest pain., Disp: 25 tablet, Rfl: 2   ondansetron  (ZOFRAN ) 4 MG tablet, Take 1 tablet (4 mg total) by mouth every 6 (six) hours., Disp: 12 tablet, Rfl: 0   oxyCODONE -acetaminophen  (PERCOCET) 7.5-325 MG tablet, Take 1 tablet by mouth every 8 (eight) hours as needed., Disp: , Rfl:    pantoprazole  (PROTONIX ) 40 MG tablet, Take 1 tablet (40 mg total) by mouth daily at 6 (six) AM., Disp: 30 tablet, Rfl: 0   rOPINIRole  (REQUIP ) 0.5 MG tablet, TAKE 4 TABLETS BY MOUTH AT 5PM, 3 TABLETS BY MOUTH AT BEDTIME, AND 1-2 TABLETS DAILY AS NEEDED, Disp: 720 tablet, Rfl: 3   Wheat Dextrin (BENEFIBER PO), Take 4-5 g by mouth daily as needed (for constipation- mix as directed)., Disp: , Rfl:    calcium  carbonate (TUMS EX) 750 MG chewable tablet, Chew 1 tablet by mouth as needed., Disp: , Rfl:    insulin  lispro (HUMALOG  KWIKPEN) 100 UNIT/ML KwikPen, Inject 14-18 Units into the skin 2 (two) times daily before a meal. (Patient taking differently: Inject 14-18 Units into the skin 2 (two) times daily before a meal. Patient takes as needed), Disp: 15 mL, Rfl: 1   Ipratropium-Albuterol  (COMBIVENT  RESPIMAT) 20-100 MCG/ACT AERS respimat, Inhale 1 puff into the lungs in the morning, at noon, and at bedtime for 7 days, THEN 1 puff every 6 (six) hours as needed for wheezing., Disp: 1 each, Rfl: 0   lidocaine  (XYLOCAINE ) 2 % solution, Use as directed 15 mLs in the mouth or throat as directed., Disp: , Rfl:    methocarbamol  (ROBAXIN ) 750 MG tablet, Take 1 tablet (750 mg total) by mouth 3 (three) times daily as needed (muscle spasm/pain)., Disp: 15 tablet, Rfl: 0 Medication Side Effects: none  Family Medical/ Social History: Changes? No  MENTAL HEALTH EXAM:  There were no vitals taken for this visit.There is no height or weight on file to calculate BMI.  General Appearance: Unable to assess  Eye Contact:  Unable to assess  Speech:  Clear and Coherent and Normal Rate  Volume:  Normal  Mood:  Euthymic   Affect:  Unable to assess  Thought Process:  Goal Directed and Descriptions of Associations: Circumstantial  Orientation:  Full (Time, Place, and Person)  Thought Content: Logical   Suicidal Thoughts:  No  Homicidal Thoughts:  No  Memory:  WNL  Judgement:  Good  Insight:  Good  Psychomotor Activity: Unable to assess  Concentration:  Concentration: Good  Recall:  Good  Fund of Knowledge: Good  Language: Good  Assets:  Communication Skills Desire for Improvement Financial Resources/Insurance Housing Resilience Transportation  ADL's:  Intact  Cognition: WNL  Prognosis:  Good   DIAGNOSES:    ICD-10-CM   1. Bipolar 1 disorder ([name])  F31.9     2. Situational mixed anxiety and depressive disorder  F43.23     3. Restless leg  syndrome  G25.81     4. Chronic pain syndrome  G89.4       Receiving Psychotherapy: Yes  Deb Young  RECOMMENDATIONS:   PDMP reviewed.  Oxycodone  filled 01/27/2024.  Gabapentin  filled 01/27/2024.  Xanax  was last filled 12/31/2022. I provided approximately  30 minutes of non-face-to-face time during this encounter, including time spent before and after the visit in records review, medical decision making, counseling pertinent to today's visit, and charting.   She is experiencing situational anxiety on top of the generalized anxiety.  She is on oxycodone  4 times a day so adding a benzodiazepine is not a good option.  She has tried numerous antidepressants and BuSpar  over the years.  (See note above) neither of us  want to add another medication or retry 1 of those.  I recommend increasing the Cymbalta for her mental health, not only for pain management.  Her pain management provider prescribes it.  She has an appointment with him tomorrow and will ask him if it is okay for us  to increase the dose to 60 mg.  I think that will help with the anxiety.  She will ask him to call and let me know.  I'm happy to prescribe it if he's ok with it.  She understands.  In the  meantime no changes will be made.  Continue Depakote  250 mg, 1 q am and continue 2 at bedtime. Continue Cymbalta 30 mg daily for pain management. Continue gabapentin  600 mg, 1 po tid. Continue ropinerole 0.5 mg,4 po at 5 PM, 3 po at bedtime, and 1-2 during day prn.  Continue counseling with Haroldine Salt.  Return in 2 months.  Verneita Cooks, PA-C

## 2024-02-22 DIAGNOSIS — Z79891 Long term (current) use of opiate analgesic: Secondary | ICD-10-CM | POA: Diagnosis not present

## 2024-02-22 DIAGNOSIS — M51362 Other intervertebral disc degeneration, lumbar region with discogenic back pain and lower extremity pain: Secondary | ICD-10-CM | POA: Diagnosis not present

## 2024-02-22 DIAGNOSIS — M797 Fibromyalgia: Secondary | ICD-10-CM | POA: Diagnosis not present

## 2024-02-22 DIAGNOSIS — F4542 Pain disorder with related psychological factors: Secondary | ICD-10-CM | POA: Diagnosis not present

## 2024-02-22 DIAGNOSIS — M5459 Other low back pain: Secondary | ICD-10-CM | POA: Diagnosis not present

## 2024-02-22 DIAGNOSIS — G894 Chronic pain syndrome: Secondary | ICD-10-CM | POA: Diagnosis not present

## 2024-02-22 DIAGNOSIS — M9953 Intervertebral disc stenosis of neural canal of lumbar region: Secondary | ICD-10-CM | POA: Diagnosis not present

## 2024-02-22 DIAGNOSIS — F319 Bipolar disorder, unspecified: Secondary | ICD-10-CM | POA: Diagnosis not present

## 2024-02-24 DIAGNOSIS — K5900 Constipation, unspecified: Secondary | ICD-10-CM | POA: Diagnosis not present

## 2024-02-24 DIAGNOSIS — R109 Unspecified abdominal pain: Secondary | ICD-10-CM | POA: Diagnosis not present

## 2024-03-20 ENCOUNTER — Telehealth: Payer: Self-pay

## 2024-03-20 NOTE — Telephone Encounter (Signed)
 Patient called wanting refills on Lantus , attempted to call back however patient did not answer phone, advise Lantus  was sent in on 01/11/2024 with 4 refills. Patient should have enough refills.

## 2024-03-21 ENCOUNTER — Ambulatory Visit: Payer: Self-pay | Admitting: Endocrinology

## 2024-03-21 ENCOUNTER — Encounter: Payer: Self-pay | Admitting: Endocrinology

## 2024-03-21 ENCOUNTER — Other Ambulatory Visit

## 2024-03-21 ENCOUNTER — Ambulatory Visit: Admitting: Endocrinology

## 2024-03-21 VITALS — BP 116/60 | HR 80 | Resp 16 | Ht 60.0 in | Wt 156.8 lb

## 2024-03-21 DIAGNOSIS — Z794 Long term (current) use of insulin: Secondary | ICD-10-CM | POA: Diagnosis not present

## 2024-03-21 DIAGNOSIS — E039 Hypothyroidism, unspecified: Secondary | ICD-10-CM | POA: Diagnosis not present

## 2024-03-21 DIAGNOSIS — E1169 Type 2 diabetes mellitus with other specified complication: Secondary | ICD-10-CM

## 2024-03-21 DIAGNOSIS — E1165 Type 2 diabetes mellitus with hyperglycemia: Secondary | ICD-10-CM | POA: Diagnosis not present

## 2024-03-21 DIAGNOSIS — E042 Nontoxic multinodular goiter: Secondary | ICD-10-CM

## 2024-03-21 LAB — POCT GLYCOSYLATED HEMOGLOBIN (HGB A1C): Hemoglobin A1C: 6.8 % — AB (ref 4.0–5.6)

## 2024-03-21 MED ORDER — INSULIN LISPRO (1 UNIT DIAL) 100 UNIT/ML (KWIKPEN): PEN_INJECTOR | SUBCUTANEOUS | 4 refills | Status: DC

## 2024-03-21 MED ORDER — INSULIN LISPRO (1 UNIT DIAL) 100 UNIT/ML (KWIKPEN): PEN_INJECTOR | SUBCUTANEOUS | 4 refills | Status: AC

## 2024-03-21 MED ORDER — LANTUS SOLOSTAR 100 UNIT/ML ~~LOC~~ SOPN: 30.0000 [IU] | PEN_INJECTOR | Freq: Every day | SUBCUTANEOUS | 4 refills | Status: AC
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
## 2024-03-22 LAB — BASIC METABOLIC PANEL WITH GFR
BUN: 18 mg/dL (ref 7–25)
CO2: 27 mmol/L (ref 20–32)
Calcium: 10.6 mg/dL — ABNORMAL HIGH (ref 8.6–10.4)
Chloride: 104 mmol/L (ref 98–110)
Creat: 0.8 mg/dL (ref 0.60–1.00)
Glucose, Bld: 120 mg/dL — ABNORMAL HIGH (ref 65–99)
Potassium: 4.8 mmol/L (ref 3.5–5.3)
Sodium: 139 mmol/L (ref 135–146)
eGFR: 76 mL/min/1.73m2

## 2024-03-22 LAB — MICROALBUMIN / CREATININE URINE RATIO
Creatinine, Urine: 84 mg/dL (ref 20–275)
Microalb Creat Ratio: 13 mg/g{creat}
Microalb, Ur: 1.1 mg/dL

## 2024-03-22 LAB — TSH: TSH: 0.69 m[IU]/L (ref 0.40–4.50)

## 2024-03-22 LAB — T4, FREE: Free T4: 1.2 ng/dL (ref 0.8–1.8)

## 2024-03-22 LAB — T3, FREE: T3, Free: 3.2 pg/mL (ref 2.3–4.2)

## 2024-03-22 MED ORDER — LIOTHYRONINE SODIUM 5 MCG PO TABS: ORAL_TABLET | ORAL | 3 refills | Status: AC

## 2024-03-22 MED ORDER — LEVOTHYROXINE SODIUM 75 MCG PO TABS: 75.0000 ug | ORAL_TABLET | Freq: Every day | ORAL | 3 refills | Status: AC

## 2024-04-10 ENCOUNTER — Other Ambulatory Visit: Payer: Self-pay

## 2024-04-10 ENCOUNTER — Emergency Department (HOSPITAL_BASED_OUTPATIENT_CLINIC_OR_DEPARTMENT_OTHER)
Admission: EM | Admit: 2024-04-10 | Discharge: 2024-04-10 | Disposition: A | Discharge status: Home / Self Care | Source: Ambulatory Visit | Attending: Emergency Medicine | Admitting: Emergency Medicine

## 2024-04-10 ENCOUNTER — Emergency Department (HOSPITAL_BASED_OUTPATIENT_CLINIC_OR_DEPARTMENT_OTHER)

## 2024-04-10 ENCOUNTER — Encounter (HOSPITAL_BASED_OUTPATIENT_CLINIC_OR_DEPARTMENT_OTHER): Payer: Self-pay

## 2024-04-10 ENCOUNTER — Ambulatory Visit: Payer: Self-pay

## 2024-04-10 DIAGNOSIS — R1012 Left upper quadrant pain: Secondary | ICD-10-CM | POA: Insufficient documentation

## 2024-04-10 DIAGNOSIS — R0789 Other chest pain: Secondary | ICD-10-CM | POA: Insufficient documentation

## 2024-04-10 DIAGNOSIS — E119 Type 2 diabetes mellitus without complications: Secondary | ICD-10-CM | POA: Insufficient documentation

## 2024-04-10 DIAGNOSIS — Z794 Long term (current) use of insulin: Secondary | ICD-10-CM | POA: Insufficient documentation

## 2024-04-10 DIAGNOSIS — R1084 Generalized abdominal pain: Secondary | ICD-10-CM | POA: Insufficient documentation

## 2024-04-10 LAB — COMPREHENSIVE METABOLIC PANEL WITH GFR
ALT: 21 U/L (ref 0–44)
AST: 22 U/L (ref 15–41)
Albumin: 4.2 g/dL (ref 3.5–5.0)
Alkaline Phosphatase: 101 U/L (ref 38–126)
Anion gap: 9 (ref 5–15)
BUN: 14 mg/dL (ref 8–23)
CO2: 27 mmol/L (ref 22–32)
Calcium: 11 mg/dL — ABNORMAL HIGH (ref 8.9–10.3)
Chloride: 102 mmol/L (ref 98–111)
Creatinine, Ser: 0.88 mg/dL (ref 0.44–1.00)
GFR, Estimated: >60 mL/min
Glucose, Bld: 111 mg/dL — ABNORMAL HIGH (ref 70–99)
Potassium: 4.7 mmol/L (ref 3.5–5.1)
Sodium: 138 mmol/L (ref 135–145)
Total Bilirubin: 0.3 mg/dL (ref 0.0–1.2)
Total Protein: 6.8 g/dL (ref 6.5–8.1)

## 2024-04-10 LAB — CBC
HCT: 44.1 % (ref 36.0–46.0)
Hemoglobin: 14.2 g/dL (ref 12.0–15.0)
MCH: 29.7 pg (ref 26.0–34.0)
MCHC: 32.2 g/dL (ref 30.0–36.0)
MCV: 92.3 fL (ref 80.0–100.0)
Platelets: 184 10*3/uL (ref 150–400)
RBC: 4.78 MIL/uL (ref 3.87–5.11)
RDW: 13.3 % (ref 11.5–15.5)
WBC: 10.3 10*3/uL (ref 4.0–10.5)
nRBC: 0 % (ref 0.0–0.2)

## 2024-04-10 LAB — LIPASE, BLOOD: Lipase: 20 U/L (ref 11–51)

## 2024-04-10 LAB — TROPONIN T, HIGH SENSITIVITY
Troponin T High Sensitivity: 11 ng/L (ref 0–19)
Troponin T High Sensitivity: 12 ng/L (ref 0–19)

## 2024-04-10 LAB — URINALYSIS, ROUTINE W REFLEX MICROSCOPIC
Bilirubin Urine: NEGATIVE
Glucose, UA: NEGATIVE mg/dL
Hgb urine dipstick: NEGATIVE
Ketones, ur: NEGATIVE mg/dL
Leukocytes,Ua: NEGATIVE
Nitrite: NEGATIVE
Protein, ur: NEGATIVE mg/dL
Specific Gravity, Urine: 1.017 (ref 1.005–1.030)
pH: 5.5 (ref 5.0–8.0)

## 2024-04-10 MED ORDER — FENTANYL CITRATE (PF) 50 MCG/ML IJ SOSY
100.0000 ug | PREFILLED_SYRINGE | Freq: Once | INTRAMUSCULAR | Status: AC
  Administered 2024-04-10: 100 ug via INTRAVENOUS
  Filled 2024-04-10: qty 2

## 2024-04-10 MED ORDER — FENTANYL CITRATE (PF) 50 MCG/ML IJ SOSY
75.0000 ug | PREFILLED_SYRINGE | Freq: Once | INTRAMUSCULAR | Status: AC
  Administered 2024-04-10: 75 ug via INTRAVENOUS
  Filled 2024-04-10: qty 2

## 2024-04-10 MED ORDER — IOHEXOL 300 MG/ML  SOLN
100.0000 mL | Freq: Once | INTRAMUSCULAR | Status: AC | PRN
  Administered 2024-04-10: 100 mL via INTRAVENOUS

## 2024-04-10 NOTE — Telephone Encounter (Signed)
 FYI Only or Action Required?: FYI only for provider: ED advised.  Patient was last seen in primary care on 11/12/2022 by Rayburn, Elouise Phlegm, PA-C.  Called Nurse Triage reporting Abdominal Pain.  Triage Disposition: Go to ED Now (Notify PCP)  Patient/caregiver understands and will follow disposition?: Yes                           Message from Miquel SAILOR sent at 04/10/2024 10:49 AM EST  Reason for Triage: PT has Severe Pain LT side stomach/for 2-3 weeks worse/ 8/10 Pain level/Can not sleep   Reason for Disposition  [1] SEVERE pain (e.g., excruciating) AND [2] present > 1 hour  Answer Assessment - Initial Assessment Questions 8/10 abdominal pain going on all night, several hours.  No passing out/LOC [employer] and was advised to go to the ED. Patient thought she was calling for the [facility] ED and does not need a PCP appt. She wanted to know what the wait time is at the ED. RN advised this is the triage line for [facility] and RN does not have that information, provided patient with the phone number for the [facility] and advised she seek emergency care.  Protocols used: Abdominal Pain - Female-A-AH

## 2024-04-10 NOTE — ED Notes (Signed)
 Spoke with lab about troponin add on at this time

## 2024-04-10 NOTE — ED Triage Notes (Signed)
 Pt POV with husband d/t LLQ ABD pain with nausea.  Pt has known diarrhea d/t her meds and hx of diverticulitis with part of Sigmoid removed as well.

## 2024-04-10 NOTE — ED Notes (Signed)
 Pt d/c instructions, medications, and follow-up care reviewed with pt. Pt verbalized understanding and had no further questions at time of d/c. Pt CA&Ox4, ambulatory, and in NAD at time of d/c. Pt discharged with husband

## 2024-04-25 ENCOUNTER — Telehealth: Admitting: Physician Assistant

## 2024-06-26 ENCOUNTER — Ambulatory Visit: Admitting: Endocrinology
